# Patient Record
Sex: Male | Born: 1965 | Race: White | Hispanic: No | Smoking: Current every day smoker
Health system: Southern US, Community
[De-identification: ages and names within clinical notes are randomized; demographics above are authoritative.]

## PROBLEM LIST (undated history)

## (undated) DIAGNOSIS — E7800 Pure hypercholesterolemia, unspecified: Secondary | ICD-10-CM

## (undated) DIAGNOSIS — D696 Thrombocytopenia, unspecified: Secondary | ICD-10-CM

## (undated) DIAGNOSIS — E119 Type 2 diabetes mellitus without complications: Secondary | ICD-10-CM

## (undated) DIAGNOSIS — D649 Anemia, unspecified: Secondary | ICD-10-CM

## (undated) DIAGNOSIS — G629 Polyneuropathy, unspecified: Secondary | ICD-10-CM

## (undated) DIAGNOSIS — E1140 Type 2 diabetes mellitus with diabetic neuropathy, unspecified: Secondary | ICD-10-CM

## (undated) DIAGNOSIS — R600 Localized edema: Secondary | ICD-10-CM

## (undated) DIAGNOSIS — T8131XA Disruption of external operation (surgical) wound, not elsewhere classified, initial encounter: Secondary | ICD-10-CM

## (undated) DIAGNOSIS — E538 Deficiency of other specified B group vitamins: Secondary | ICD-10-CM

## (undated) DIAGNOSIS — Z8719 Personal history of other diseases of the digestive system: Secondary | ICD-10-CM

## (undated) DIAGNOSIS — G250 Essential tremor: Secondary | ICD-10-CM

## (undated) DIAGNOSIS — F329 Major depressive disorder, single episode, unspecified: Secondary | ICD-10-CM

## (undated) DIAGNOSIS — J45909 Unspecified asthma, uncomplicated: Secondary | ICD-10-CM

## (undated) DIAGNOSIS — L03116 Cellulitis of left lower limb: Secondary | ICD-10-CM

## (undated) DIAGNOSIS — F1010 Alcohol abuse, uncomplicated: Secondary | ICD-10-CM

## (undated) DIAGNOSIS — K219 Gastro-esophageal reflux disease without esophagitis: Secondary | ICD-10-CM

## (undated) DIAGNOSIS — G621 Alcoholic polyneuropathy: Secondary | ICD-10-CM

## (undated) DIAGNOSIS — F419 Anxiety disorder, unspecified: Secondary | ICD-10-CM

## (undated) DIAGNOSIS — M869 Osteomyelitis, unspecified: Secondary | ICD-10-CM

## (undated) DIAGNOSIS — Z9889 Other specified postprocedural states: Secondary | ICD-10-CM

## (undated) DIAGNOSIS — I10 Essential (primary) hypertension: Secondary | ICD-10-CM

## (undated) DIAGNOSIS — F32A Depression, unspecified: Secondary | ICD-10-CM

## (undated) DIAGNOSIS — I739 Peripheral vascular disease, unspecified: Secondary | ICD-10-CM

## (undated) DIAGNOSIS — M1990 Unspecified osteoarthritis, unspecified site: Secondary | ICD-10-CM

## (undated) HISTORY — PX: OTHER SURGICAL HISTORY: SHX169

---
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
## 2001-05-03 ENCOUNTER — Emergency Department (HOSPITAL_COMMUNITY): Admission: EM | Admit: 2001-05-03 | Discharge: 2001-05-03 | Payer: Self-pay | Admitting: *Deleted

## 2004-04-19 ENCOUNTER — Emergency Department (HOSPITAL_COMMUNITY): Admission: EM | Admit: 2004-04-19 | Discharge: 2004-04-19 | Payer: Self-pay | Admitting: Emergency Medicine

## 2008-10-09 ENCOUNTER — Emergency Department (HOSPITAL_COMMUNITY): Admission: EM | Admit: 2008-10-09 | Discharge: 2008-10-09 | Payer: Self-pay | Admitting: Emergency Medicine

## 2011-08-05 LAB — CSF CULTURE

## 2011-08-05 LAB — URINALYSIS, ROUTINE W REFLEX MICROSCOPIC
Bilirubin Urine: NEGATIVE
Glucose, UA: NEGATIVE mg/dL
Hgb urine dipstick: NEGATIVE
Nitrite: NEGATIVE
Protein, ur: NEGATIVE mg/dL
Specific Gravity, Urine: 1.015 (ref 1.005–1.030)
Urobilinogen, UA: 4 mg/dL — ABNORMAL HIGH (ref 0.0–1.0)
pH: 6 (ref 5.0–8.0)

## 2011-08-05 LAB — CSF CELL COUNT WITH DIFFERENTIAL
Eosinophils, CSF: 6 % — ABNORMAL HIGH (ref 0–1)
Lymphs, CSF: 30 % — ABNORMAL LOW (ref 40–80)
Monocyte-Macrophage-Spinal Fluid: 4 % — ABNORMAL LOW (ref 15–45)
RBC Count, CSF: 2640 /mm3 — ABNORMAL HIGH
RBC Count, CSF: 44000 /mm3 — ABNORMAL HIGH
Segmented Neutrophils-CSF: 60 % — ABNORMAL HIGH (ref 0–6)
Tube #: 1
Tube #: 4
WBC, CSF: 2 /mm3 (ref 0–5)
WBC, CSF: 215 /mm3 — ABNORMAL HIGH (ref 0–5)

## 2011-08-05 LAB — BASIC METABOLIC PANEL
BUN: 9 mg/dL (ref 6–23)
CO2: 26 mEq/L (ref 19–32)
Calcium: 9.2 mg/dL (ref 8.4–10.5)
Chloride: 101 mEq/L (ref 96–112)
Creatinine, Ser: 0.97 mg/dL (ref 0.4–1.5)
GFR calc Af Amer: >60 mL/min (ref >60)
GFR calc non Af Amer: >60 mL/min (ref >60)
Glucose, Bld: 130 mg/dL — ABNORMAL HIGH (ref 70–99)
Potassium: 3.7 mEq/L (ref 3.5–5.1)
Sodium: 135 mEq/L (ref 135–145)

## 2011-08-05 LAB — PROTEIN AND GLUCOSE, CSF
Glucose, CSF: 69 mg/dL (ref 43–76)
Total  Protein, CSF: 55 mg/dL — ABNORMAL HIGH (ref 15–45)

## 2011-08-05 LAB — CSF CULTURE W GRAM STAIN: Culture: NO GROWTH

## 2014-03-10 ENCOUNTER — Emergency Department (HOSPITAL_COMMUNITY): Payer: BC Managed Care – PPO

## 2014-03-10 ENCOUNTER — Encounter (HOSPITAL_COMMUNITY): Payer: Self-pay | Admitting: Emergency Medicine

## 2014-03-10 ENCOUNTER — Observation Stay (HOSPITAL_COMMUNITY)
Admission: EM | Admit: 2014-03-10 | Discharge: 2014-03-11 | Disposition: A | Discharge status: Home / Self Care | Payer: BC Managed Care – PPO | Attending: Internal Medicine | Admitting: Internal Medicine

## 2014-03-10 DIAGNOSIS — I152 Hypertension secondary to endocrine disorders: Secondary | ICD-10-CM | POA: Diagnosis present

## 2014-03-10 DIAGNOSIS — E1142 Type 2 diabetes mellitus with diabetic polyneuropathy: Secondary | ICD-10-CM | POA: Diagnosis present

## 2014-03-10 DIAGNOSIS — E785 Hyperlipidemia, unspecified: Secondary | ICD-10-CM | POA: Diagnosis present

## 2014-03-10 DIAGNOSIS — E1169 Type 2 diabetes mellitus with other specified complication: Secondary | ICD-10-CM | POA: Diagnosis present

## 2014-03-10 DIAGNOSIS — E669 Obesity, unspecified: Secondary | ICD-10-CM | POA: Insufficient documentation

## 2014-03-10 DIAGNOSIS — R42 Dizziness and giddiness: Secondary | ICD-10-CM | POA: Insufficient documentation

## 2014-03-10 DIAGNOSIS — I10 Essential (primary) hypertension: Secondary | ICD-10-CM

## 2014-03-10 DIAGNOSIS — E1159 Type 2 diabetes mellitus with other circulatory complications: Secondary | ICD-10-CM | POA: Diagnosis present

## 2014-03-10 DIAGNOSIS — R079 Chest pain, unspecified: Principal | ICD-10-CM | POA: Diagnosis present

## 2014-03-10 DIAGNOSIS — R0609 Other forms of dyspnea: Secondary | ICD-10-CM | POA: Insufficient documentation

## 2014-03-10 DIAGNOSIS — R0600 Dyspnea, unspecified: Secondary | ICD-10-CM

## 2014-03-10 DIAGNOSIS — M6281 Muscle weakness (generalized): Secondary | ICD-10-CM | POA: Insufficient documentation

## 2014-03-10 DIAGNOSIS — M549 Dorsalgia, unspecified: Secondary | ICD-10-CM | POA: Insufficient documentation

## 2014-03-10 DIAGNOSIS — F1010 Alcohol abuse, uncomplicated: Secondary | ICD-10-CM

## 2014-03-10 DIAGNOSIS — R0602 Shortness of breath: Secondary | ICD-10-CM | POA: Insufficient documentation

## 2014-03-10 DIAGNOSIS — E7800 Pure hypercholesterolemia, unspecified: Secondary | ICD-10-CM

## 2014-03-10 DIAGNOSIS — D696 Thrombocytopenia, unspecified: Secondary | ICD-10-CM

## 2014-03-10 DIAGNOSIS — F1210 Cannabis abuse, uncomplicated: Secondary | ICD-10-CM | POA: Insufficient documentation

## 2014-03-10 DIAGNOSIS — R0989 Other specified symptoms and signs involving the circulatory and respiratory systems: Secondary | ICD-10-CM

## 2014-03-10 DIAGNOSIS — R209 Unspecified disturbances of skin sensation: Secondary | ICD-10-CM | POA: Insufficient documentation

## 2014-03-10 DIAGNOSIS — E119 Type 2 diabetes mellitus without complications: Secondary | ICD-10-CM

## 2014-03-10 DIAGNOSIS — R61 Generalized hyperhidrosis: Secondary | ICD-10-CM | POA: Insufficient documentation

## 2014-03-10 DIAGNOSIS — Z6835 Body mass index (BMI) 35.0-35.9, adult: Secondary | ICD-10-CM | POA: Insufficient documentation

## 2014-03-10 HISTORY — DX: Essential (primary) hypertension: I10

## 2014-03-10 HISTORY — DX: Pure hypercholesterolemia, unspecified: E78.00

## 2014-03-10 HISTORY — DX: Type 2 diabetes mellitus without complications: E11.9

## 2014-03-10 LAB — CBC WITH DIFFERENTIAL/PLATELET
Basophils Absolute: 0 10*3/uL (ref 0.0–0.1)
Basophils Relative: 1 % (ref 0–1)
Eosinophils Absolute: 0.1 10*3/uL (ref 0.0–0.7)
Eosinophils Relative: 3 % (ref 0–5)
HCT: 42.7 % (ref 39.0–52.0)
Hemoglobin: 15.1 g/dL (ref 13.0–17.0)
Lymphocytes Relative: 38 % (ref 12–46)
Lymphs Abs: 1.9 10*3/uL (ref 0.7–4.0)
MCH: 33.4 pg (ref 26.0–34.0)
MCHC: 35.4 g/dL (ref 30.0–36.0)
MCV: 94.5 fL (ref 78.0–100.0)
Monocytes Absolute: 0.3 10*3/uL (ref 0.1–1.0)
Monocytes Relative: 6 % (ref 3–12)
Neutro Abs: 2.6 10*3/uL (ref 1.7–7.7)
Neutrophils Relative %: 52 % (ref 43–77)
Platelets: 129 10*3/uL — ABNORMAL LOW (ref 150–400)
RBC: 4.52 MIL/uL (ref 4.22–5.81)
RDW: 13.9 % (ref 11.5–15.5)
WBC: 4.9 10*3/uL (ref 4.0–10.5)

## 2014-03-10 LAB — GLUCOSE, CAPILLARY: Glucose-Capillary: 148 mg/dL — ABNORMAL HIGH (ref 70–99)

## 2014-03-10 LAB — BASIC METABOLIC PANEL
BUN: 14 mg/dL (ref 6–23)
CO2: 24 mEq/L (ref 19–32)
Calcium: 9.5 mg/dL (ref 8.4–10.5)
Chloride: 100 mEq/L (ref 96–112)
Creatinine, Ser: 1.02 mg/dL (ref 0.50–1.35)
GFR calc Af Amer: >90 mL/min (ref >90)
GFR calc non Af Amer: 85 mL/min — ABNORMAL LOW (ref >90)
Glucose, Bld: 116 mg/dL — ABNORMAL HIGH (ref 70–99)
Potassium: 3.7 mEq/L (ref 3.7–5.3)
Sodium: 137 mEq/L (ref 137–147)

## 2014-03-10 LAB — TROPONIN I
Troponin I: <0.3 ng/mL (ref <0.30)
Troponin I: <0.3 ng/mL (ref <0.30)
Troponin I: <0.3 ng/mL (ref <0.30)

## 2014-03-10 LAB — LIPID PANEL
Cholesterol: 150 mg/dL (ref 0–200)
HDL: 39 mg/dL — ABNORMAL LOW (ref >39)
LDL Cholesterol: 78 mg/dL (ref 0–99)
Total CHOL/HDL Ratio: 3.8 RATIO
Triglycerides: 164 mg/dL — ABNORMAL HIGH (ref <150)
VLDL: 33 mg/dL (ref 0–40)

## 2014-03-10 LAB — CREATININE, SERUM
Creatinine, Ser: 0.9 mg/dL (ref 0.50–1.35)
GFR calc Af Amer: >90 mL/min (ref >90)
GFR calc non Af Amer: >90 mL/min (ref >90)

## 2014-03-10 LAB — D-DIMER, QUANTITATIVE: D-Dimer, Quant: <0.27 ug/mL-FEU (ref 0.00–0.48)

## 2014-03-10 MED ORDER — SODIUM CHLORIDE 0.9 % IJ SOLN: 3.0000 mL | Freq: Two times a day (BID) | INTRAMUSCULAR | Status: DC

## 2014-03-10 MED ORDER — CYCLOBENZAPRINE HCL 10 MG PO TABS
5.0000 mg | ORAL_TABLET | Freq: Three times a day (TID) | ORAL | Status: DC | PRN
  Administered 2014-03-10: 5 mg via ORAL
  Filled 2014-03-10: qty 1

## 2014-03-10 MED ORDER — LORAZEPAM 2 MG/ML IJ SOLN: 1.0000 mg | Freq: Four times a day (QID) | INTRAMUSCULAR | Status: DC | PRN

## 2014-03-10 MED ORDER — LISINOPRIL 10 MG PO TABS
20.0000 mg | ORAL_TABLET | Freq: Every day | ORAL | Status: DC
  Administered 2014-03-11: 20 mg via ORAL
  Filled 2014-03-10: qty 2

## 2014-03-10 MED ORDER — THIAMINE HCL 100 MG/ML IJ SOLN: 100.0000 mg | Freq: Every day | INTRAMUSCULAR | Status: DC

## 2014-03-10 MED ORDER — ASPIRIN EC 325 MG PO TBEC
325.0000 mg | DELAYED_RELEASE_TABLET | Freq: Every day | ORAL | Status: DC
  Administered 2014-03-11: 325 mg via ORAL
  Filled 2014-03-10: qty 1

## 2014-03-10 MED ORDER — SODIUM CHLORIDE 0.9 % IV SOLN
INTRAVENOUS | Status: AC
  Administered 2014-03-10: 20:00:00 via INTRAVENOUS

## 2014-03-10 MED ORDER — INSULIN ASPART 100 UNIT/ML ~~LOC~~ SOLN: 0.0000 [IU] | Freq: Every day | SUBCUTANEOUS | Status: DC

## 2014-03-10 MED ORDER — INSULIN ASPART 100 UNIT/ML ~~LOC~~ SOLN: 0.0000 [IU] | Freq: Three times a day (TID) | SUBCUTANEOUS | Status: DC

## 2014-03-10 MED ORDER — ASPIRIN 81 MG PO CHEW
324.0000 mg | CHEWABLE_TABLET | Freq: Once | ORAL | Status: AC
  Administered 2014-03-10: 324 mg via ORAL
  Filled 2014-03-10: qty 4

## 2014-03-10 MED ORDER — LORAZEPAM 1 MG PO TABS
1.0000 mg | ORAL_TABLET | Freq: Four times a day (QID) | ORAL | Status: DC | PRN
Start: 2014-03-10 — End: 2014-03-11

## 2014-03-10 MED ORDER — FOLIC ACID 1 MG PO TABS
1.0000 mg | ORAL_TABLET | Freq: Every day | ORAL | Status: DC
  Administered 2014-03-10 – 2014-03-11 (×2): 1 mg via ORAL
  Filled 2014-03-10 (×2): qty 1

## 2014-03-10 MED ORDER — VITAMIN B-1 100 MG PO TABS
100.0000 mg | ORAL_TABLET | Freq: Every day | ORAL | Status: DC
  Administered 2014-03-10 – 2014-03-11 (×2): 100 mg via ORAL
  Filled 2014-03-10 (×2): qty 1

## 2014-03-10 MED ORDER — MORPHINE SULFATE 2 MG/ML IJ SOLN
2.0000 mg | INTRAMUSCULAR | Status: DC | PRN
Start: 2014-03-10 — End: 2014-03-11

## 2014-03-10 MED ORDER — ENOXAPARIN SODIUM 60 MG/0.6ML ~~LOC~~ SOLN
60.0000 mg | SUBCUTANEOUS | Status: DC
  Administered 2014-03-10: 60 mg via SUBCUTANEOUS
  Filled 2014-03-10: qty 0.6

## 2014-03-10 MED ORDER — ADULT MULTIVITAMIN W/MINERALS CH
1.0000 | ORAL_TABLET | Freq: Every day | ORAL | Status: DC
  Administered 2014-03-10 – 2014-03-11 (×2): 1 via ORAL
  Filled 2014-03-10 (×2): qty 1

## 2014-03-10 NOTE — H&P (Signed)
[facility] History and Physical  Eugene Diaz AOZ:[id number] DOB: 05/31/1966 DOA: 03/10/2014  Referring physician:  PCP: Collene Mares, PA-C   Chief Complaint: Shortness of breath, chest pain, generalized weakness  HPI: Eugene Diaz is a 48 y.o. male with past medical history of newly diagnosed diabetes, hypertension and hypercholesterolemia presents to the emergency department with the chief complaint of shortness of breath/chest pain generalized weakness that has been gradually worsening over the last 1-2 weeks. He states he was recently diagnosed with diabetes in about 3 weeks ago was started on oral medications. He feels like since then he has felt "funny". Over the last week to 10 days he has developed shortness of breath and chest pain. Chest pain is located left anterior radiates to his back is worse with exertion and described as a tightness. with exertion. Associated symptoms include back pain located in mid thoracic area worsening with deep inspiration and movement and lower extremity tingling. He states the symptom come and go he was out of town and he did not call his doctor until today. His PCP recommended he come to the emergency department. He did travel to [facility] last week and drove home Friday. He indicates he had these symptoms prior to the trip. He denies abdominal pain nausea vomiting diarrhea constipation melena hematochezia. He denies fever chills cough headache syncope near-syncope. His workup in the emergency room includes a basic metabolic panel that is unremarkable and shows a serum glucose of 116 and a complete blood count is unremarkable with the exception of platelets 129. Chest x-ray is without acute cardiopulmonary process, EKG with sinus rhythm and abnormal R wave progression. In the emergency room he was given 324 mg of aspirin. He is hemodynamically stable afebrile and not hypoxic  Review of Systems:  10 point review of systems completed and all  systems are negative except as indicated in the history of present illness   Past Medical History  Diagnosis Date  . Diabetes mellitus without complication   . Hypertension   . Hypercholesterolemia    History reviewed. No pertinent past surgical history. Social History:  reports that he has never smoked. He does not have any smokeless tobacco history on file. He reports that he drinks alcohol. He reports that he uses illicit drugs (Marijuana). 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 He is married lives with his wife is independent with ADLs No Known Allergies  History reviewed. No pertinent family history. family medical history is positive for father has been dead for 30 years patient is unsure of his medical history. Mother is alive her medical history is negative for diabetes heart failure she did have "some sort cancer". Patient has a sister he is unsure of her medical health but he feels like she does not have diabetes or heart disease.  Prior to Admission medications   Medication Sig Start Date End Date Taking? Authorizing Provider  Empagliflozin (JARDIANCE) 25 MG TABS Take 1 tablet by mouth daily.   Yes Historical Provider, MD  lisinopril (PRINIVIL,ZESTRIL) 20 MG tablet Take 20 mg by mouth daily.   Yes Historical Provider, MD  sitaGLIPtin-metformin (JANUMET) 50-1000 MG per tablet Take 1 tablet by mouth daily.   Yes Historical Provider, MD   Physical Exam: Filed Vitals:   03/10/14 1400  BP: 124/84  Pulse: 71  Temp:   Resp:     BP 124/84  Pulse 71  Temp(Src) 98.3 F (36.8 C) (Oral)  Resp 20  SpO2 99%  General:  Appears calm and comfortable, looks older than his stated age of 59 Eyes: PERRL, normal lids, irises & conjunctiva ENT: grossly normal hearing, lips & tongue Neck: no LAD, masses or thyromegaly Cardiovascular: RRR, no m/r/g. No LE edema. Some tenderness to palpation left anterior chest as well as mid thoracic back. Telemetry: SR, no arrhythmias   Respiratory: CTA bilaterally, no w/r/r. Normal respiratory effort. Abdomen: soft, ntnd positive bowel sounds throughout Skin: no rash or induration seen on limited exam Musculoskeletal: grossly normal tone BUE/BLE good muscle tone Psychiatric: grossly normal mood and affect, speech fluent and appropriate Neurologic: grossly non-focal. Speech clear facial some           Labs on Admission:  Basic Metabolic Panel:  Recent Labs Lab 03/10/14 1240  NA 137  K 3.7  CL 100  CO2 24  GLUCOSE 116*  BUN 14  CREATININE 1.02  CALCIUM 9.5   Liver Function Tests: No results found for this basename: AST, ALT, ALKPHOS, BILITOT, PROT, ALBUMIN,  in the last 168 hours No results found for this basename: LIPASE, AMYLASE,  in the last 168 hours No results found for this basename: AMMONIA,  in the last 168 hours CBC:  Recent Labs Lab 03/10/14 1240  WBC 4.9  NEUTROABS 2.6  HGB 15.1  HCT 42.7  MCV 94.5  PLT 129*   Cardiac Enzymes:  Recent Labs Lab 03/10/14 1240  TROPONINI <0.30    BNP (last 3 results) No results found for this basename: PROBNP,  in the last 8760 hours CBG: No results found for this basename: GLUCAP,  in the last 168 hours  Radiological Exams on Admission: Dg Chest Portable 1 View  03/10/2014   CLINICAL DATA:  Shortness of breath.  EXAM: PORTABLE CHEST - 1 VIEW  COMPARISON:  None.  FINDINGS: 1259 hrs. No focal airspace consolidation. No airspace pulmonary edema. No pleural effusion or pneumothorax. Cardiopericardial silhouette is at upper limits of normal for size. Telemetry leads overlie the chest.  IMPRESSION: No acute cardiopulmonary findings.   Electronically Signed   By: Misty Stanley M.D.   On: 03/10/2014 13:18    EKG: Independently reviewed normal sinus rhythm Assessment/Plan Principal Problem:   Chest pain, rule out acute myocardial infarction: Risk factors include obesity, diabetes, hypertension. Will admit to telemetry. I doubt this is cardiac related as  patient's pain is reproducible on palpation. We'll cycle cardiac enzymes get serial EKGs request 2-D echo. Will continue aspirin obtain a lipid panel. Provide nitroglycerin and morphine as needed for any chest pain. Offer oxygen supplementation as indicated.  Active Problems:   Diabetes mellitus without complication: Patient recently diagnosed and started on Jardiance and janumet. He reports his blood sugars have been running between 180 and 225. He checks them twice a day. I will hold these medications for now. Obtain hemoglobin A1c and use sliding scale insulin for optimal control. Patient feels like onset of symptoms coincide with initiation of medication. He does report that he complained of side effects to his PCP and dosage of medications was reduced.    Hypertension: Controlled. Continued lisinopril    Hypercholesterolemia: Will obtain a lipid panel.    Thrombocytopenia: likely related to ETOH use. No signs symptoms of bleeding. Will monitor.  ETOH abuse: reports drinking average of 6 pack daily. Will use CIWa. Request SW consult.      Code Status: full Family Communication: wife at bedside Disposition Plan: home hopefully in am.   Time spent: 69 minutes  [facility]  Pager [phone]

## 2014-03-10 NOTE — H&P (Signed)
Patient seen and examined.  Note per Dyanne Carrel, NP reviewed.  Please refer for further details.  Patient presents to the emergency room today with complaints of progressive shortness of breath on exertion. He has associated heaviness in his chest. He describes pain between his shoulder blades which is worse on deep inspiration. Symptoms are exacerbated by exertion, relieved with rest. He has associated diaphoresis and lightheadedness. Symptoms have been progressive. He has risk factors for coronary disease including hypertension, obesity, diabetes. He recently went on a trip in [place]. Patient will be admitted for overnight observation. Cardiac enzymes will be checked. EKG is nonacute. Echocardiogram will be checked. We'll also check d-dimer, and if positive will consider CT angiography chest. Due to his multiple risk factors and concerning history, who requested cardiology consultation for possible stress test in the morning. We'll keep the patient n.p.o. after midnight in case stress test is pursued tomorrow. Continue on aspirin.  [employer]

## 2014-03-10 NOTE — ED Notes (Signed)
Floor unable to take report at this time.

## 2014-03-10 NOTE — ED Notes (Signed)
Santiago Glad PA paged for pain med for pt c/o pain to back

## 2014-03-10 NOTE — ED Provider Notes (Signed)
CSN: [id number]     Arrival date & time 03/10/14  1146 History  This chart was scribed for Eugene Cable, MD by Jenne Campus, ED Scribe. This patient was seen in room APA17/APA17 and the patient's care was started at 12:37 PM.    Chief Complaint  Patient presents with  . Shortness of Breath     Patient is a 48 y.o. male presenting with shortness of breath. The history is provided by the patient. No language interpreter was used.  Shortness of Breath Severity:  Severe Onset quality:  Gradual Duration:  1 week Timing:  Constant Progression:  Worsening Chronicity:  New Relieved by:  Rest Worsened by:  Exertion and activity Associated symptoms: chest pain   Associated symptoms: no fever and no vomiting   Risk factors: no hx of PE/DVT     HPI Comments: Eugene Diaz is a 48 y.o. male who presents to the Emergency Department complaining of a new onset of SOB with associated back pain and bilateral lower extremity pain that has been present for the past week. Exertion aggravates the symptoms and he reports decreased ambulation due to the symptoms. He denies any MI, DVT/PE or other pulmonary conditions. He traveled to [place] last week but has symptoms prior to travel He also reports episodes of chest tightness but none currently He also reports a secondary complaint of dizziness that is noted with sudden movements, but he denies any abdominal pain, emesis, diarrhea, fevers or cough as associated symptoms. Wife states that the pt was recently diagnosed with DM one month ago. The pt denies smoking.    Past Medical History  Diagnosis Date  . Diabetes mellitus without complication   . Hypertension   . Hypercholesterolemia    History reviewed. No pertinent past surgical history. History reviewed. No pertinent family history. History  Substance Use Topics  . Smoking status: Never Smoker   . Smokeless tobacco: Not on file  . Alcohol Use: Yes    Review of Systems   Constitutional: Negative for fever.  Respiratory: Positive for shortness of breath.   Cardiovascular: Positive for chest pain.  Gastrointestinal: Negative for vomiting and diarrhea.  Neurological: Positive for dizziness.  All other systems reviewed and are negative.   Allergies  Review of patient's allergies indicates no known allergies.  Home Medications   Prior to Admission medications   Medication Sig Start Date End Date Taking? Authorizing Provider  Empagliflozin (JARDIANCE) 25 MG TABS Take 1 tablet by mouth daily.   Yes Historical Provider, MD  lisinopril (PRINIVIL,ZESTRIL) 20 MG tablet Take 20 mg by mouth daily.   Yes Historical Provider, MD  sitaGLIPtin-metformin (JANUMET) 50-1000 MG per tablet Take 1 tablet by mouth daily.   Yes Historical Provider, MD   Triage Vitals: P 123/79  Pulse 75  Temp(Src) 98.3 F (36.8 C) (Oral)  Resp 20  SpO2 100%  Physical Exam  Nursing note and vitals reviewed.  CONSTITUTIONAL: Well developed/well nourished HEAD: Normocephalic/atraumatic EYES: EOMI/PERRL ENMT: Mucous membranes moist NECK: supple no meningeal signs SPINE:entire spine nontender CV: S1/S2 noted, no murmurs/rubs/gallops noted LUNGS: Lungs are clear to auscultation bilaterally, no apparent distress ABDOMEN: soft, nontender, no rebound or guarding GU:no cva tenderness NEURO: Pt is awake/alert, moves all extremitiesx4 EXTREMITIES: pulses normal, full ROM, no calf tenderness noted SKIN: warm, color normal PSYCH: no abnormalities of mood noted  ED Course  Procedures   Medications  aspirin chewable tablet 324 mg (324 mg Oral Given 03/10/14 1254)    DIAGNOSTIC STUDIES:  Oxygen Saturation is 100% on RA, normal by my interpretation.    COORDINATION OF CARE: 12:42 PM-Discussed treatment plan which includes CXR, CBC panel and BMP  with pt at bedside and pt agreed to plan.  2:48 PM Pt with concerning h/o worsening exertional SOB and also episodes of CP He does have risk  factors for CAD.   Will admit for further monitoring and ACS rule out Currently PERC negative, doubt PE/Dissection at this time D/w dr Roderic Palau, will admit Labs Review Labs Reviewed  BASIC METABOLIC PANEL - Abnormal; Notable for the following:    Glucose, Bld 116 (*)    GFR calc non Af Amer 85 (*)    All other components within normal limits  CBC WITH DIFFERENTIAL - Abnormal; Notable for the following:    Platelets 129 (*)    All other components within normal limits  TROPONIN I    Imaging Review Dg Chest Portable 1 View  03/10/2014   CLINICAL DATA:  Shortness of breath.  EXAM: PORTABLE CHEST - 1 VIEW  COMPARISON:  None.  FINDINGS: 1259 hrs. No focal airspace consolidation. No airspace pulmonary edema. No pleural effusion or pneumothorax. Cardiopericardial silhouette is at upper limits of normal for size. Telemetry leads overlie the chest.  IMPRESSION: No acute cardiopulmonary findings.   Electronically Signed   By: Misty Stanley M.D.   On: 03/10/2014 13:18     EKG Interpretation   Date/Time:  Monday Mar 10 2014 12:21:35 EDT Ventricular Rate:  70 PR Interval:  172 QRS Duration: 108 QT Interval:  398 QTC Calculation: 429 R Axis:   -61 Text Interpretation:  Sinus rhythm Left anterior fascicular block  Borderline low voltage, extremity leads Abnormal R-wave progression, late  transition No previous ECGs available Confirmed by Christy Gentles  MD, Elenore Rota  [id number]) on 03/10/2014 12:36:07 PM      MDM   Final diagnoses:  Dyspnea  Chest pain, rule out acute myocardial infarction    I personally performed the services described in this documentation, which was scribed in my presence. The recorded information has been reviewed and is accurate.        Eugene Cable, MD 03/10/14 [phone]

## 2014-03-10 NOTE — ED Notes (Signed)
Santiago Glad PA paged again

## 2014-03-10 NOTE — ED Notes (Signed)
Sob, back pain, legs and feet hurt.  Hands hurt, dizzy

## 2014-03-11 ENCOUNTER — Observation Stay (HOSPITAL_COMMUNITY): Payer: BC Managed Care – PPO

## 2014-03-11 ENCOUNTER — Encounter (HOSPITAL_COMMUNITY): Payer: Self-pay

## 2014-03-11 ENCOUNTER — Encounter (HOSPITAL_COMMUNITY): Payer: Self-pay | Admitting: Adult Health

## 2014-03-11 DIAGNOSIS — R0989 Other specified symptoms and signs involving the circulatory and respiratory systems: Secondary | ICD-10-CM

## 2014-03-11 DIAGNOSIS — R079 Chest pain, unspecified: Secondary | ICD-10-CM

## 2014-03-11 DIAGNOSIS — I517 Cardiomegaly: Secondary | ICD-10-CM

## 2014-03-11 DIAGNOSIS — F1010 Alcohol abuse, uncomplicated: Secondary | ICD-10-CM

## 2014-03-11 DIAGNOSIS — E119 Type 2 diabetes mellitus without complications: Secondary | ICD-10-CM

## 2014-03-11 DIAGNOSIS — R0609 Other forms of dyspnea: Secondary | ICD-10-CM

## 2014-03-11 LAB — CBC
HCT: 43.1 % (ref 39.0–52.0)
Hemoglobin: 14.9 g/dL (ref 13.0–17.0)
MCH: 33.1 pg (ref 26.0–34.0)
MCHC: 34.6 g/dL (ref 30.0–36.0)
MCV: 95.8 fL (ref 78.0–100.0)
Platelets: 137 10*3/uL — ABNORMAL LOW (ref 150–400)
RBC: 4.5 MIL/uL (ref 4.22–5.81)
RDW: 14.2 % (ref 11.5–15.5)
WBC: 4.9 10*3/uL (ref 4.0–10.5)

## 2014-03-11 LAB — BASIC METABOLIC PANEL
BUN: 13 mg/dL (ref 6–23)
CO2: 27 mEq/L (ref 19–32)
Calcium: 9.4 mg/dL (ref 8.4–10.5)
Chloride: 105 mEq/L (ref 96–112)
Creatinine, Ser: 0.95 mg/dL (ref 0.50–1.35)
GFR calc Af Amer: >90 mL/min (ref >90)
GFR calc non Af Amer: >90 mL/min (ref >90)
Glucose, Bld: 120 mg/dL — ABNORMAL HIGH (ref 70–99)
Potassium: 4.2 mEq/L (ref 3.7–5.3)
Sodium: 142 mEq/L (ref 137–147)

## 2014-03-11 LAB — GLUCOSE, CAPILLARY
Glucose-Capillary: 128 mg/dL — ABNORMAL HIGH (ref 70–99)
Glucose-Capillary: 111 mg/dL — ABNORMAL HIGH (ref 70–99)

## 2014-03-11 LAB — D-DIMER, QUANTITATIVE: D-Dimer, Quant: 0.29 ug/mL-FEU (ref 0.00–0.48)

## 2014-03-11 LAB — HEMOGLOBIN A1C
Hgb A1c MFr Bld: 6.5 % — ABNORMAL HIGH (ref <5.7)
Mean Plasma Glucose: 140 mg/dL — ABNORMAL HIGH (ref <117)

## 2014-03-11 LAB — TSH
TSH: 2.07 u[IU]/mL (ref 0.350–4.500)
TSH: 1.33 u[IU]/mL (ref 0.350–4.500)

## 2014-03-11 MED ORDER — LIVING WELL WITH DIABETES BOOK
Freq: Once | Status: DC
  Filled 2014-03-11 (×2): qty 1

## 2014-03-11 MED ORDER — ASPIRIN EC 81 MG PO TBEC: 81.0000 mg | DELAYED_RELEASE_TABLET | Freq: Every day | ORAL | Status: DC

## 2014-03-11 MED ORDER — REGADENOSON 0.4 MG/5ML IV SOLN
INTRAVENOUS | Status: AC
  Filled 2014-03-11: qty 5

## 2014-03-11 MED ORDER — ALBUTEROL SULFATE HFA 108 (90 BASE) MCG/ACT IN AERS: 2.0000 | INHALATION_SPRAY | Freq: Four times a day (QID) | RESPIRATORY_TRACT | Status: DC | PRN

## 2014-03-11 MED ORDER — CYCLOBENZAPRINE HCL 5 MG PO TABS: 5.0000 mg | ORAL_TABLET | Freq: Three times a day (TID) | ORAL | Status: DC | PRN

## 2014-03-11 MED ORDER — ADULT MULTIVITAMIN W/MINERALS CH: 1.0000 | ORAL_TABLET | Freq: Every day | ORAL | Status: DC

## 2014-03-11 MED ORDER — ATORVASTATIN CALCIUM 40 MG PO TABS
40.0000 mg | ORAL_TABLET | Freq: Every day | ORAL | Status: DC
Start: 2014-03-11 — End: 2014-06-05

## 2014-03-11 MED ORDER — ATORVASTATIN CALCIUM 40 MG PO TABS: 40.0000 mg | ORAL_TABLET | Freq: Every day | ORAL | Status: DC

## 2014-03-11 MED ORDER — TECHNETIUM TC 99M SESTAMIBI - CARDIOLITE
10.0000 | Freq: Once | INTRAVENOUS | Status: AC | PRN
  Administered 2014-03-11: 10 via INTRAVENOUS

## 2014-03-11 MED ORDER — TECHNETIUM TC 99M SESTAMIBI GENERIC - CARDIOLITE
30.0000 | Freq: Once | INTRAVENOUS | Status: AC | PRN
  Administered 2014-03-11: 30 via INTRAVENOUS

## 2014-03-11 MED ORDER — SODIUM CHLORIDE 0.9 % IJ SOLN
INTRAMUSCULAR | Status: AC
  Administered 2014-03-11: 10 mL via INTRAVENOUS
  Filled 2014-03-11: qty 10

## 2014-03-11 MED ORDER — FOLIC ACID 1 MG PO TABS: 1.0000 mg | ORAL_TABLET | Freq: Every day | ORAL | Status: DC

## 2014-03-11 NOTE — Discharge Summary (Signed)
Physician Discharge Summary  Eugene Diaz CBJ:[id number] DOB: Dec 16, 1965 DOA: 03/10/2014  PCP: Collene Mares, PA-C  Admit date: 03/10/2014 Discharge date: 03/11/2014  Time spent: 40 minutes  Recommendations for Outpatient Follow-up:  1. Follow up with Dr Collene Mares PCP 1-2 weeks for evaluation of symptoms. May want to consider PFT's. Recommend monitoring diabetes as mediations adjusted.   Discharge Diagnoses:  Principal Problem:   Chest pain, rule out acute myocardial infarction Active Problems:   Diabetes mellitus without complication   Hypertension   Hypercholesterolemia   Thrombocytopenia   ETOH abuse   Chest pain   Discharge Condition: stable  Diet recommendation: heart healthy carb modified  Filed Weights   03/10/14 1832 03/11/14 0500  Weight: 125.193 kg (276 lb) 117.572 kg (259 lb 3.2 oz)    History Eugene Diaz is a 48 y.o. male with past medical history of newly diagnosed diabetes, hypertension and hypercholesterolemia presented to the emergency department on 03/10/14 with the chief complaint of shortness of breath/chest pain generalized weakness that had been gradually worsening over the last 1-2 weeks. He stated he was recently diagnosed with diabetes about 3 weeks ago was started on oral medications. He felt like since then he has felt "funny". Over the last week to 10 days he had developed shortness of breath and chest pain. Chest pain  located left anterior radiated to his back was worse with exertion and described as a tightness. with exertion. Associated symptoms included back pain located in mid thoracic area worsening with deep inspiration and movement and lower extremity tingling. He stated the symptom come and go he was out of town and he did not call his doctor until today. His PCP recommended he come to the emergency department. He did travel to [facility] last week and drove home Friday. He indicates he had these symptoms prior to the trip. He denies abdominal  pain nausea vomiting diarrhea constipation melena hematochezia. He denied fever chills cough headache syncope near-syncope. His workup in the emergency room included a basic metabolic panel that was unremarkable and showed a serum glucose of 116 and a complete blood count is unremarkable with the exception of platelets 129. Chest x-ray without acute cardiopulmonary process, EKG with sinus rhythm and abnormal R wave progression. In the emergency room he was given 324 mg of aspirin. He was hemodynamically stable afebrile and not hypoxic   Hospital Course:  Principal Problem:  Chest pain, rule out acute myocardial infarction: Risk factors include obesity, diabetes, hypertension. Troponin negative x3, no events on tele. Lipid panel triglycerides 164, HDL 39, chest xray unremarkable, EKG without evidence of ACS echo with normal LV function and no wall motion abnormalities grade 2 diastolic dysfunction. Stress test negative. Evaluated by cardiology who recommendl continue aspirin and statin.  Active Problems:  Diabetes mellitus without complication: Patient recently diagnosed and started on Jardiance and janumet. He reports his blood sugars have been running between 180 and 225. A1c 6.3. Will discontinue  jardiance due to side effects. Continue Janumet. Recommend close follow up with PCP for optimal control  Hypertension: Controlled. Continued lisinopril   Hypercholesterolemia: see above  Thrombocytopenia: likely related to ETOH use. No signs symptoms of bleeding.   ETOH abuse: reports drinking average of 6 pack daily. No s.sx withdrawal Procedures:  NM stress test 03/11/14  Consultations:  cardiology  Discharge Exam: Filed Vitals:   03/11/14 1438  BP: 122/76  Pulse: 69  Temp: 97.8 F (36.6 C)  Resp: 18    General: well nourished  NAD Cardiovascular: RRR No MGR No LE edema Respiratory: normal effort BS coarse with good air flow.   Discharge Instructions You were cared for by a  hospitalist during your hospital stay. If you have any questions about your discharge medications or the care you received while you were in the hospital after you are discharged, you can call the unit and asked to speak with the hospitalist on call if the hospitalist that took care of you is not available. Once you are discharged, your primary care physician will handle any further medical issues. Please note that NO REFILLS for any discharge medications will be authorized once you are discharged, as it is imperative that you return to your primary care physician (or establish a relationship with a primary care physician if you do not have one) for your aftercare needs so that they can reassess your need for medications and monitor your lab values.      Discharge Orders   Future Orders Complete By Expires   Call MD for:  persistant nausea and vomiting  As directed    Call MD for:  temperature >100.4  As directed    Diet - low sodium heart healthy  As directed    Discharge instructions  As directed    Increase activity slowly  As directed        Medication List    STOP taking these medications       JARDIANCE 25 MG Tabs  Generic drug:  Empagliflozin      TAKE these medications       albuterol 108 (90 BASE) MCG/ACT inhaler  Commonly known as:  PROVENTIL HFA;VENTOLIN HFA  Inhale 2 puffs into the lungs every 6 (six) hours as needed for wheezing or shortness of breath.     aspirin EC 81 MG tablet  Take 1 tablet (81 mg total) by mouth daily.     atorvastatin 40 MG tablet  Commonly known as:  LIPITOR  Take 1 tablet (40 mg total) by mouth daily at 6 PM.     cyclobenzaprine 5 MG tablet  Commonly known as:  FLEXERIL  Take 1 tablet (5 mg total) by mouth 3 (three) times daily as needed for muscle spasms.     folic acid 1 MG tablet  Commonly known as:  FOLVITE  Take 1 tablet (1 mg total) by mouth daily.     lisinopril 20 MG tablet  Commonly known as:  PRINIVIL,ZESTRIL  Take 20 mg by  mouth daily.     multivitamin with minerals Tabs tablet  Take 1 tablet by mouth daily.     sitaGLIPtin-metformin 50-1000 MG per tablet  Commonly known as:  JANUMET  Take 1 tablet by mouth daily.       No Known Allergies Follow-up Information   Follow up with MANN, BENJAMIN, PA-C. Schedule an appointment as soon as possible for a visit in 2 weeks. (evaluate symptoms)    Specialty:  Physician Assistant   Contact information:   [address] [place] [address]        The results of significant diagnostics from this hospitalization (including imaging, microbiology, ancillary and laboratory) are listed below for reference.    Significant Diagnostic Studies: Nm Myocar Single W/spect W/wall Motion And Ef  03/11/2014   CLINICAL DATA:  48 year old male referred for chest pain.  EXAM: MYOCARDIAL IMAGING WITH SPECT (REST AND EXERCISE)  GATED LEFT VENTRICULAR WALL MOTION STUDY  LEFT VENTRICULAR EJECTION FRACTION  TECHNIQUE: Standard myocardial SPECT imaging  was performed after resting intravenous injection of 10 mCi Tc-15m sestamibi. Subsequently, exercise tolerance test was performed by the patient under the supervision of the Cardiology staff. At peak-stress, 30 mCi Tc-45m sestamibi was injected intravenously and standard myocardial SPECT imaging was performed. Quantitative gated imaging was also performed to evaluate left ventricular wall motion, and estimate left ventricular ejection fraction.  COMPARISON:  None.  FINDINGS: Exercise stress  Baseline EKG showed normal sinus rhythm with left axis deviation and probable left anterior fascicular block. The patient was stressed according to the Bruce protocol for 9 min achieving a work level of 10.4 Mets. The resting heart rate is 76 beats per min rose to a maximal rate of 160 beats per min representing 93% of the age predicted target heart rate. The resting blood pressure of 117/76 increased up to 159/89. The test was stopped due to fatigue,  the patient did not experience any chest pain.  Stress EKG showed no specific ischemic changes and no significant arrhythmias. Interpretation was somewhat limited by heavy artifact.  Myocardial perfusion imaging  Raw images showed appropriate radiotracer uptake. There was a moderate-sized inferior wall defect seen in the resting images, this defect was less intense in the post-injection images. This area had normal wall motion, consistent with sub- diaphragmatic attenuation. There was a small apical defect seen in both the resting and post-injection images, this area had normal wall motion, consistent with apical thinning.  Gated imaging showed end-diastolic volume 109 mL, and systolic volume 46 mL, left ventricular ejection fraction 63%, t.i.d. 0.98. Wall motion was normal.  IMPRESSION: 1.  Negative Lexiscan MPI for ischemia  2. Normal left ventricular systolic function with normal wall motion  3.  Low risk study for major cardiac events.   Electronically Signed   By: Carlyle Dolly   On: 03/11/2014 14:45   Dg Chest Portable 1 View  03/10/2014   CLINICAL DATA:  Shortness of breath.  EXAM: PORTABLE CHEST - 1 VIEW  COMPARISON:  None.  FINDINGS: 1259 hrs. No focal airspace consolidation. No airspace pulmonary edema. No pleural effusion or pneumothorax. Cardiopericardial silhouette is at upper limits of normal for size. Telemetry leads overlie the chest.  IMPRESSION: No acute cardiopulmonary findings.   Electronically Signed   By: Misty Stanley M.D.   On: 03/10/2014 13:18    Microbiology: No results found for this or any previous visit (from the past 240 hour(s)).   Labs: Basic Metabolic Panel:  Recent Labs Lab 03/10/14 1240 03/10/14 1940 03/11/14 0519  NA 137  --  142  K 3.7  --  4.2  CL 100  --  105  CO2 24  --  27  GLUCOSE 116*  --  120*  BUN 14  --  13  CREATININE 1.02 0.90 0.95  CALCIUM 9.5  --  9.4   Liver Function Tests: No results found for this basename: AST, ALT, ALKPHOS, BILITOT,  PROT, ALBUMIN,  in the last 168 hours No results found for this basename: LIPASE, AMYLASE,  in the last 168 hours No results found for this basename: AMMONIA,  in the last 168 hours CBC:  Recent Labs Lab 03/10/14 1240 03/11/14 0519  WBC 4.9 4.9  NEUTROABS 2.6  --   HGB 15.1 14.9  HCT 42.7 43.1  MCV 94.5 95.8  PLT 129* 137*   Cardiac Enzymes:  Recent Labs Lab 03/10/14 1240 03/10/14 1940 03/10/14 2316  TROPONINI <0.30 <0.30 <0.30   BNP: BNP (last 3 results) No results found for  this basename: PROBNP,  in the last 8760 hours CBG:  Recent Labs Lab 03/10/14 2157 03/11/14 0250 03/11/14 0719  GLUCAP 148* 128* 111*       Signed:  Lezlie Octave Marcayla Budge  [facility] 03/11/2014, 4:31 PM

## 2014-03-11 NOTE — Consult Note (Signed)
CARDIOLOGY CONSULT NOTE   Patient ID: JAYCEN VERCHER MRN: [id number] DOB/AGE: Mar 14, 1966 48 y.o.  Admit Date: 03/10/2014 Referring Physician: PTH Primary Physician: Collene Mares, PA-C Consulting Cardiologist: Carlyle Dolly MD Primary Cardiologist: New Reason for Consultation: Chest Pain or shortness of breath  Clinical Summary Mr. Panek is a 48 y.o.male with known history of diabetes, hypertension, hypercholesterolemia, ETOH abuse. with no prior cardiac history or cardiac work-up, presented to the ER with one week of worsening shortness of breath and diaphoresis, with pain and pressure on inspiration. 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 He has been newly diagnosed with diabetes (6 weeks ago) by the [place] clinic.    On arrival to ER, BP 123/79, HR of 75, O2 sat of 100%. He was treated with ASA. CXR was normal. Troponin negative first blood draw. No D-Dimer completed. He is on DT prophylaxis due to ETOH abuse.    He is currently pain free, but complained of significant diaphoresis overnight. Breathing status has normalized.        No Known Allergies  Medications Scheduled Medications: . aspirin EC  325 mg Oral Daily  . enoxaparin (LOVENOX) injection  60 mg Subcutaneous Q24H  . folic acid  1 mg Oral Daily  . insulin aspart  0-5 Units Subcutaneous QHS  . insulin aspart  0-9 Units Subcutaneous TID WC  . lisinopril  20 mg Oral Daily  . multivitamin with minerals  1 tablet Oral Daily  . sodium chloride  3 mL Intravenous Q12H  . thiamine  100 mg Oral Daily   Or  . thiamine  100 mg Intravenous Daily    PRN Medications: cyclobenzaprine, LORazepam, LORazepam, morphine injection   Past Medical History  Diagnosis Date  . Diabetes mellitus without complication 04/4331  . Hypertension   . Hypercholesterolemia     Past Surgical History  Procedure Laterality Date  . Left arm      Left arm repair    Family History  Problem  Relation Age of Onset  . Heart attack Mother   . Cancer Father     Social History Mr. Michalski reports that he has quit smoking. He does not have any smokeless tobacco history on file. Mr. Corpening reports that he drinks about 25 ounces of alcohol per week.  Review of Systems Otherwise reviewed and negative except as outlined.  Physical Examination Blood pressure 114/76, pulse 59, temperature 97.8 F (36.6 C), temperature source Oral, resp. rate 18, height 6\' 2"  (1.88 m), weight 259 lb 3.2 oz (117.572 kg), SpO2 98.00%.  Intake/Output Summary (Last 24 hours) at 03/11/14 0921 Last data filed at 03/11/14 0540  Gross per 24 hour  Intake    960 ml  Output   2075 ml  Net  -1115 ml    Telemetry:NSR  GEN: No acute distress, resting comfortably.Obese HEENT: Conjunctiva and lids normal, oropharynx clear with moist mucosa. Neck: Supple, no elevated JVP or carotid bruits, no thyromegaly. Lungs: Clear to auscultation, nonlabored breathing at rest. Cardiac: Regular rate and rhythm, no S3 or significant systolic murmur, no pericardial rub. Abdomen: Soft, nontender, no hepatomegaly, bowel sounds present, no guarding or rebound. Extremities: No pitting edema, distal pulses 2+. Skin: Warm and dry. Musculoskeletal: No kyphosis. Neuropsychiatric: Alert and oriented x3, affect grossly appropriate.  Prior Cardiac Testing/Procedures None Lab Results  Basic Metabolic Panel:  Recent Labs Lab 03/10/14 1240 03/10/14 1940 03/11/14 0519  NA 137  --  142  K 3.7  --  4.2  CL 100  --  105  CO2 24  --  27  GLUCOSE 116*  --  120*  BUN 14  --  13  CREATININE 1.02 0.90 0.95  CALCIUM 9.5  --  9.4    CBC:  Recent Labs Lab 03/10/14 1240 03/11/14 0519  WBC 4.9 4.9  NEUTROABS 2.6  --   HGB 15.1 14.9  HCT 42.7 43.1  MCV 94.5 95.8  PLT 129* 137*    Cardiac Enzymes:  Recent Labs Lab 03/10/14 1240 03/10/14 1940 03/10/14 2316  TROPONINI <0.30 <0.30 <0.30    Radiology: Dg Chest  Portable 1 View  03/10/2014   CLINICAL DATA:  Shortness of breath.  EXAM: PORTABLE CHEST - 1 VIEW  COMPARISON:  None.  FINDINGS: 1259 hrs. No focal airspace consolidation. No airspace pulmonary edema. No pleural effusion or pneumothorax. Cardiopericardial silhouette is at upper limits of normal for size. Telemetry leads overlie the chest.  IMPRESSION: No acute cardiopulmonary findings.   Electronically Signed   By: Misty Stanley M.D.   On: 03/10/2014 13:18     ECG:NSR with Left axis deviation.   Impression and Recommendations 1.Chest Pain: Associated DOE, diaphoresis, weakness. No prior cardiac history with multiple CVRF to include newly diagnosed diabetes, hypertension, FH, obesity, hypercholesterolemia and ETOH abuse, with former tobacco abuse. EKG without evidence of ACS, troponin is negative X1. He has been kept NPO and will have NM stress Cardiolite today. Echo is pending. Continue ASA. Check TSH.  2, Dyspnea:  Nephew recently diagnosed with multiple PE's requiring IVC filter. I will check D-Dimer for evaluation of same.   3. Hypertension: Currently well controlled. On lisinopril 20 mg daily.  Echo pending  4. Diabetes: Newly diagnosed. He is now on SS insulin coverage.  5. ETOH abuse: Admits to 6-8 beers a day, with heavy whiskey use in the past. Now on DT precautions. He also smokes marijuana.   6. Obesity: BMI 35.5. Lifestyle modifications with increased exercise and caloric limitation is recommended.    Signed: Phill Myron. Purcell Nails NP [facility] 03/11/2014, 9:21 AM Co-Sign MD  Attending Note Patient seen and discussed with NP Purcell Nails. 48 yo male history of DM, HTN, HL, EtOH admitted with chest pain. EKG with NSR and LAFB, no acute ischemic changes. Trop neg x 3. Given multiple risk factors and description of symptoms there is concern for possible obstructive CAD. Will obtain Lexiscan to evaluate today. His DM is followed as an outpatient at the [facility] clinic. His lipid  profile is not at goal given his history of DM, based on newest guidelines should at least be on a moderate dose statin. Will start lipitor 40mg  daily.  Echo shows normal LV function with no wall motion abnormalities, grade II diastolic dysfunction. Will follow up results of stress test.  Carlyle Dolly MD

## 2014-03-11 NOTE — Progress Notes (Signed)
  Echocardiogram 2D Echocardiogram has been performed.  Tawny Raspberry L Emmaleigh Longo 03/11/2014, 10:30 AM

## 2014-03-11 NOTE — Progress Notes (Addendum)
Inpatient Diabetes Program Recommendations  AACE/ADA: New Consensus Statement on Inpatient Glycemic Control (2013)  Target Ranges:  Prepandial:   less than 140 mg/dL      Peak postprandial:   less than 180 mg/dL (1-2 hours)      Critically ill patients:  140 - 180 mg/dL   Results for Eugene Diaz, Eugene Diaz (MRN [id number]) as of 03/11/2014 10:51  Ref. Range 03/10/2014 21:57 03/11/2014 02:50 03/11/2014 07:19  Glucose-Capillary Latest Range: 70-99 mg/dL 148 (H) 128 (H) 111 (H)   Reason for Visit:Consult  Diabetes history: Newly dx DM2 Outpatient Diabetes medications: Jardiance 25 mg daily, Janumet 50-1000 mg BID Current orders for Inpatient glycemic control: Novolog 0-9 units AC, Novolog 0-5 units HS   JARDIANCE is an SGLT2 inhibitor that blocks SGLT2, reducing renal glucose reabsorption. Side effects of Jardiance include: dehydration, dizziness, weakness, lightheadedness, low blood glucose, and urinary tract infections. The recommended starting dose of Jardiance is 10 mg daily in the am.  Janumet contains 2 prescription medicines: sitagliptin (JANUVIA) and metformin.  Janumet is intended to help the pancreas make more insulin, help the body more effectively use the insulin that it makes, and helps decrease excess sugar that the liver makes. Side effects of Janumet include: diarrhea, constipation, mild nausea, upset stomach; headache, weakness, back pain, joint or mucle pain; or cold symptom.  Note: Noted consult for diabetes coordinator due to patient symptomatic with dizziness since being started on PO DM medications noted above.  In reviewing Jardiance and Janumet, symptoms correlate more with side effects of Jardiance (see above). If Vania Rea is continued as an outpatient, may want to consider decreasing dose to 10 mg daily and have patient follow up with PCP.  Will plan to talk with patient today to discuss diabetes, symptoms, and be sure patient is educated on hypoglycemia signs, symptoms, and  treatment.  03/11/14 @ 15:00 - Talked with the patient and he states that he was dx with DM about 5-6 weeks ago and he was started on the two medications he is currently taking (Jardiance and Janumet).  Patient reports that he checks his blood sugar twice a day ( in am runs in 160's and in the evening it runs in 200's).  Inquired about his A1C and patient states he doesn't know what an A1C is and he does not recall his PCP telling him that value.  Patient reports that over the past 8-10 days he has been feeling weak, dizzy, sweaty, lightheaded, shaky, and like he was going to pass out.  Patient also reports that over the past 8-10 days he has also been aching in his legs, feet, and back.  Discussed impact of nutrition, exercise, stress, and sickness on diabetes control.  Patient states that since he was dx with DM he has made major changes in his diet and has lost 24 pounds over the last 6 weeks.  Encouraged patient to check his blood sugar anytime he has noted symptoms to be sure his blood sugar is not getting too low. Patient reports that he was initially checking his blood sugar 4-5 times per day but when he ran out of strips he was told that his insurance would only pay for strips to test twice a day since that is what his PCP recommended.  Informed patient about Reil-On glucometer at [facility] for $17 and Reli-On test strips 50 for $9 in case patient wanted to pay out of pocket to check his blood sugar more often.  Discussed hypoglycemia along with treatment.  Patient is open to learning and keeping his diabetes controlled but he does not want to continue to have the noted symptoms he has been having for over a week now. Ordered the Living Well with Diabetes booklet and encouraged patient to review the book.  Patient verbalized understanding of information discussed and states that he does not have any further questions related to diabetes at this time.  Would recommend discontinuing Jardiance and have patient  check blood glucose more frequently and follow up with his PCP.    Thanks, Barnie Alderman, RN, MSN, CCRN Diabetes Coordinator Inpatient Diabetes Program [phone] (Team Pager) [phone] (AP office) [phone] [facility] office)

## 2014-03-11 NOTE — Progress Notes (Signed)
UR completed 

## 2014-03-11 NOTE — Progress Notes (Signed)
Stress Lab Nurses Notes - [place] 03/11/2014 Reason for doing test: chest tightness & SOB Type of test: Stress Cardiolite / Inpatient Rm 311 Nurse performing test: Gerrit Halls, RN Nuclear Medicine Tech: Dyanne Carrel Echo Tech: Not Applicable MD performing test: Branch/K.Lawrence NP Family MD: Collene Mares PA-C Test explained and consent signed: yes IV started: Saline lock flushed, No redness or edema and Saline lock from floor Symptoms: fatigue in legs & SOB Treatment/Intervention: None Reason test stopped: fatigue and reached target HR After recovery IV was: No redness or edema and Saline Lock flushed Patient to return to [place]. Med at : 13:10 Patient discharged: Transported back to room 311 via wc Patient's Condition upon discharge was: stable Comments: During test peak BP 159/89 & HR 160.  Recovery BP 113/81 & HR 94.  Symptoms resolved in recovery. Donnajean Lopes

## 2014-03-11 NOTE — Plan of Care (Signed)
Problem: Food- and Nutrition-Related Knowledge Deficit (NB-1.1) Goal: Nutrition education Formal process to instruct or train a patient/client in a skill or to impart knowledge to help patients/clients voluntarily manage or modify food choices and eating behavior to maintain or improve health. Outcome: Adequate for Discharge  RD consulted for nutrition education regarding diabetes.     Lab Results  Component Value Date    HGBA1C 6.5* 03/10/2014    RD provided "Carbohydrate Counting for People with Diabetes" handout from the Academy of Nutrition and Dietetics. Discussed different food groups and their effects on blood sugar, emphasizing carbohydrate-containing foods. Provided list of carbohydrates and recommended serving sizes of common foods.  Discussed importance of controlled and consistent carbohydrate intake throughout the day. Provided examples of ways to balance meals/snacks and encouraged intake of high-fiber, whole grain complex carbohydrates. Teach back method used.  Expect good compliance.  Body mass index is 33.27 kg/(m^2). Pt meets criteria for obesity, class I based on current BMI.  Current diet order is Heart Healthy/ Carb Modified, patient is consuming approximately 100% of meals at this time. Labs and medications reviewed. No further nutrition interventions warranted at this time. RD contact information provided. If additional nutrition issues arise, please re-consult RD.  Jacquilyn Seldon A. Jimmye Norman, RD, LDN Pager: [phone]

## 2014-03-11 NOTE — Progress Notes (Signed)
SATURATION QUALIFICATIONS: (This note is used to comply with regulatory documentation for home oxygen)  Patient Saturations on Room Air at Rest = 100%  Patient Saturations on Room Air while Ambulating = 98%  Patient Saturations on 0 Liters of oxygen while Ambulating = 100%  Please briefly explain why patient needs home oxygen: N/A

## 2014-03-12 ENCOUNTER — Encounter: Payer: Self-pay | Admitting: *Deleted

## 2014-03-12 NOTE — H&P (Signed)
Patient seen and examined. Above note reviewed.    He has been admitted with chest pain and shortness of breath.  Plan will be to rule out ACS with cardiac markers and cardiology consult for stress testing.  [employer]

## 2014-03-12 NOTE — Discharge Summary (Signed)
Patient seen and examined.  Above note reviewed.  Patient has ruled out for ACS with negative cardiac markers. Stress test negative for ischemia. D dimer is negative.  It appears his main symptom is shortness of breath.  Patient may benefit from PFTs as an outpatient.   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 His new diabetes medications may also be playing a role in his symptoms, and he has been advised to discontinue Jardiance until he sees his doctor.  He has been adequately evaluated in the hospital and can be discharged home.  [employer]

## 2014-06-05 ENCOUNTER — Encounter (HOSPITAL_COMMUNITY): Payer: Self-pay | Admitting: Emergency Medicine

## 2014-06-05 ENCOUNTER — Emergency Department (HOSPITAL_COMMUNITY): Payer: BC Managed Care – PPO

## 2014-06-05 ENCOUNTER — Emergency Department (HOSPITAL_COMMUNITY)
Admission: EM | Admit: 2014-06-05 | Discharge: 2014-06-05 | Disposition: A | Discharge status: Home / Self Care | Payer: BC Managed Care – PPO | Attending: Emergency Medicine | Admitting: Emergency Medicine

## 2014-06-05 DIAGNOSIS — Z87891 Personal history of nicotine dependence: Secondary | ICD-10-CM | POA: Insufficient documentation

## 2014-06-05 DIAGNOSIS — Z79899 Other long term (current) drug therapy: Secondary | ICD-10-CM | POA: Insufficient documentation

## 2014-06-05 DIAGNOSIS — R0602 Shortness of breath: Secondary | ICD-10-CM | POA: Insufficient documentation

## 2014-06-05 DIAGNOSIS — E119 Type 2 diabetes mellitus without complications: Secondary | ICD-10-CM | POA: Insufficient documentation

## 2014-06-05 DIAGNOSIS — E7800 Pure hypercholesterolemia, unspecified: Secondary | ICD-10-CM | POA: Insufficient documentation

## 2014-06-05 DIAGNOSIS — R05 Cough: Secondary | ICD-10-CM | POA: Insufficient documentation

## 2014-06-05 DIAGNOSIS — R059 Cough, unspecified: Secondary | ICD-10-CM | POA: Insufficient documentation

## 2014-06-05 DIAGNOSIS — I10 Essential (primary) hypertension: Secondary | ICD-10-CM | POA: Insufficient documentation

## 2014-06-05 LAB — CBC WITH DIFFERENTIAL/PLATELET
Basophils Absolute: 0.1 10*3/uL (ref 0.0–0.1)
Basophils Relative: 1 % (ref 0–1)
Eosinophils Absolute: 0.2 10*3/uL (ref 0.0–0.7)
Eosinophils Relative: 4 % (ref 0–5)
HCT: 40.5 % (ref 39.0–52.0)
Hemoglobin: 14.4 g/dL (ref 13.0–17.0)
Lymphocytes Relative: 42 % (ref 12–46)
Lymphs Abs: 1.8 10*3/uL (ref 0.7–4.0)
MCH: 34.3 pg — ABNORMAL HIGH (ref 26.0–34.0)
MCHC: 35.6 g/dL (ref 30.0–36.0)
MCV: 96.4 fL (ref 78.0–100.0)
Monocytes Absolute: 0.3 10*3/uL (ref 0.1–1.0)
Monocytes Relative: 6 % (ref 3–12)
Neutro Abs: 2 10*3/uL (ref 1.7–7.7)
Neutrophils Relative %: 47 % (ref 43–77)
Platelets: 133 10*3/uL — ABNORMAL LOW (ref 150–400)
RBC: 4.2 MIL/uL — ABNORMAL LOW (ref 4.22–5.81)
RDW: 13.2 % (ref 11.5–15.5)
WBC: 4.3 10*3/uL (ref 4.0–10.5)

## 2014-06-05 LAB — I-STAT TROPONIN, ED: Troponin i, poc: 0 ng/mL (ref 0.00–0.08)

## 2014-06-05 LAB — BASIC METABOLIC PANEL
Anion gap: 13 (ref 5–15)
BUN: 12 mg/dL (ref 6–23)
CO2: 23 mEq/L (ref 19–32)
Calcium: 9.5 mg/dL (ref 8.4–10.5)
Chloride: 100 mEq/L (ref 96–112)
Creatinine, Ser: 1.02 mg/dL (ref 0.50–1.35)
GFR calc Af Amer: >90 mL/min (ref >90)
GFR calc non Af Amer: 85 mL/min — ABNORMAL LOW (ref >90)
Glucose, Bld: 162 mg/dL — ABNORMAL HIGH (ref 70–99)
Potassium: 3.8 mEq/L (ref 3.7–5.3)
Sodium: 136 mEq/L — ABNORMAL LOW (ref 137–147)

## 2014-06-05 MED ORDER — PREDNISONE 10 MG PO TABS: ORAL_TABLET | ORAL | Status: DC

## 2014-06-05 MED ORDER — ALBUTEROL SULFATE (2.5 MG/3ML) 0.083% IN NEBU: 2.5000 mg | INHALATION_SOLUTION | Freq: Once | RESPIRATORY_TRACT | Status: DC

## 2014-06-05 MED ORDER — METHYLPREDNISOLONE SODIUM SUCC 125 MG IJ SOLR
125.0000 mg | Freq: Once | INTRAMUSCULAR | Status: AC
  Administered 2014-06-05: 125 mg via INTRAVENOUS
  Filled 2014-06-05: qty 2

## 2014-06-05 MED ORDER — IPRATROPIUM-ALBUTEROL 0.5-2.5 (3) MG/3ML IN SOLN
3.0000 mL | Freq: Once | RESPIRATORY_TRACT | Status: AC
  Administered 2014-06-05: 3 mL via RESPIRATORY_TRACT
  Filled 2014-06-05: qty 3

## 2014-06-05 MED ORDER — SODIUM CHLORIDE 0.9 % IV SOLN
INTRAVENOUS | Status: DC
  Administered 2014-06-05: 12:00:00 via INTRAVENOUS

## 2014-06-05 NOTE — ED Provider Notes (Signed)
Medical screening examination/treatment/procedure(s) were conducted as a shared visit with non-physician practitioner(s) and myself.  I personally evaluated the patient during the encounter.   EKG Interpretation   Date/Time:  Thursday June 05 2014 10:55:11 EDT Ventricular Rate:  75 PR Interval:  180 QRS Duration: 93 QT Interval:  383 QTC Calculation: 428 R Axis:   -15 Text Interpretation:  Sinus rhythm Low voltage, extremity leads Abnormal  R-wave progression, late transition Confirmed by Pearse Shiffler,  DO, Masiyah Jorstad  [phone]) on 06/05/2014 11:13:51 AM      Pt is a 48 y.o. male with history of hypertension, diabetes, prior tobacco use who presents emergency department with several weeks of constant shortness of breath and diffuse chest tightness. He was seen in the hospital in May 2015 and had negative cardiac enzymes, stress test and d-dimer. At that time he was discharged home with inhalers and told to followup with his primary care physician for pulmonary function tests. He states he has not had any pulmonary function tests are seen a pulmonologist. He has an appointment with his PCP tomorrow. Per minute level provider, patient had decreased breath sounds and diminished aeration. He has received albuterol and Solu-Medrol and states to me he is feeling much better. He is not hypoxic and is able to ambulate without hypoxia. No respiratory distress or increased work of breathing. His lungs are clear to auscultation with good aeration. He has had normal labs, negative troponin and a clear chest x-ray. Suspect COPD versus other lung disease. Given he has had pain and symptoms for several weeks constantly and has a negative troponin a recent negative stress test, I do not feel he needs further workup for this at this time as I doubt ACS. D-dimer recently was also negative. Doubt PE. He appears very comfortable on exam and has equal pulses in all of his extremities and no back pain, doubt dissection. We'll  discharge home with prescription for prednisone. He has an appointment with his primary care physician tomorrow. Discussed strict return precautions and supportive care instructions. He verbalizes understanding and is comfortable plan.  [place], DO 06/05/14 1346

## 2014-06-05 NOTE — Discharge Instructions (Signed)
We have given you IV steroids today. We are sending you home with a prescription of steroids. Continue to use your inhalers as directed. Follow up with your PCP tomorrow. Return here as needed.   Shortness of Breath Shortness of breath means you have trouble breathing. Shortness of breath needs medical care right away. HOME CARE   Do not smoke.  Avoid being around chemicals or things (paint fumes, dust) that may bother your breathing.  Rest as needed. Slowly begin your normal activities.  Only take medicines as told by your doctor.  Keep all doctor visits as told. GET HELP RIGHT AWAY IF:   Your shortness of breath gets worse.  You feel lightheaded, pass out (faint), or have a cough that is not helped by medicine.  You cough up blood.  You have pain with breathing.  You have pain in your chest, arms, shoulders, or belly (abdomen).  You have a fever.  You cannot walk up stairs or exercise the way you normally do.  You do not get better in the time expected.  You have a hard time doing normal activities even with rest.  You have problems with your medicines.  You have any new symptoms. MAKE SURE YOU:  Understand these instructions.  Will watch your condition.  Will get help right away if you are not doing well or get worse. Document Released: 04/04/2008 Document Revised: 10/22/2013 Document Reviewed: 01/02/2012 [facility] Patient Information 2015 [place], [place]. This information is not intended to replace advice given to you by your health care provider. Make sure you discuss any questions you have with your health care provider.

## 2014-06-05 NOTE — ED Notes (Signed)
Delano Metz, NP at bedside

## 2014-06-05 NOTE — ED Notes (Signed)
States he feels better in the cool air, but still feels short of breath.  States chest is not at tight now.

## 2014-06-05 NOTE — ED Notes (Signed)
IV d/c'd to left AC, catheter intact and site WNL, dsg applied to site

## 2014-06-05 NOTE — ED Provider Notes (Signed)
CSN: [id number]     Arrival date & time 06/05/14  1034 History   First MD Initiated Contact with Patient 06/05/14 1052     Chief Complaint  Patient presents with  . Shortness of Breath     (Consider location/radiation/quality/duration/timing/severity/associated sxs/prior Treatment) Patient is a 48 y.o. male presenting with shortness of breath. The history is provided by the patient.  Shortness of Breath Severity:  Moderate Onset quality:  Gradual Duration:  3 weeks Timing:  Constant Progression:  Worsening Relieved by:  Nothing Worsened by:  Activity Ineffective treatments:  Inhaler Associated symptoms: cough   Associated symptoms: no abdominal pain, no chest pain, no fever, no hemoptysis, no neck pain, no sore throat, no sputum production and no syncope    Eugene Diaz is a 48 y.o. male who presents to the ED with shortness of breath that started 3 weeks ago. He has been using his inhalers without relief. He is using Advair and Albuterol. He tried to work yesterday and his boss sent him home because he was having so much problem with breathing. He used his wife's oxygen to help him feel better yesterday.   Past Medical History  Diagnosis Date  . Diabetes mellitus without complication 10/3242  . Hypertension   . Hypercholesterolemia    Past Surgical History  Procedure Laterality Date  . Left arm      Left arm repair   Family History  Problem Relation Age of Onset  . Heart attack Mother   . Cancer Father    History  Substance Use Topics  . Smoking status: Former Research scientist (life sciences)  . Smokeless tobacco: Not on file  . Alcohol Use: 25.0 oz/week    50 drink(s) per week     Comment: occasional    Review of Systems  Constitutional: Negative for fever.  HENT: Negative for sore throat.   Respiratory: Positive for cough and shortness of breath. Negative for hemoptysis and sputum production.   Cardiovascular: Negative for chest pain and syncope.  Gastrointestinal: Negative for  abdominal pain.  Musculoskeletal: Negative for neck pain.      Allergies  Review of patient's allergies indicates no known allergies.  Home Medications   Prior to Admission medications   Medication Sig Start Date End Date Taking? Authorizing Provider  albuterol (PROVENTIL HFA;VENTOLIN HFA) 108 (90 BASE) MCG/ACT inhaler Inhale 2 puffs into the lungs every 6 (six) hours as needed for wheezing or shortness of breath. 03/11/14   Radene Gunning, NP  aspirin EC 81 MG tablet Take 1 tablet (81 mg total) by mouth daily. 03/11/14   Radene Gunning, NP  atorvastatin (LIPITOR) 40 MG tablet Take 1 tablet (40 mg total) by mouth daily at 6 PM. 03/11/14   Radene Gunning, NP  cyclobenzaprine (FLEXERIL) 5 MG tablet Take 1 tablet (5 mg total) by mouth 3 (three) times daily as needed for muscle spasms. 03/11/14   Radene Gunning, NP  folic acid (FOLVITE) 1 MG tablet Take 1 tablet (1 mg total) by mouth daily. 03/11/14   Radene Gunning, NP  lisinopril (PRINIVIL,ZESTRIL) 20 MG tablet Take 20 mg by mouth daily.    Historical Provider, MD  Multiple Vitamin (MULTIVITAMIN WITH MINERALS) TABS tablet Take 1 tablet by mouth daily. 03/11/14   Radene Gunning, NP  sitaGLIPtin-metformin (JANUMET) 50-1000 MG per tablet Take 1 tablet by mouth daily.    Historical Provider, MD   BP 123/85  Pulse 82  Temp(Src) 98.1 F (36.7 C)  Resp 22  Ht 6\' 2"  (1.88 m)  Wt 259 lb (117.482 kg)  BMI 33.24 kg/m2  SpO2 97% Physical Exam  Nursing note and vitals reviewed. Constitutional: He is oriented to person, place, and time. He appears well-developed and well-nourished.  Eyes: EOM are normal.  Neck: Neck supple.  Pulmonary/Chest: Accessory muscle usage present. He has decreased breath sounds.  Abdominal: Soft. There is no tenderness.  Musculoskeletal: Normal range of motion.  Neurological: He is alert and oriented to person, place, and time. No cranial nerve deficit.  Skin: Skin is warm and dry.  Psychiatric: He has a normal mood and affect.  His behavior is normal.    Dg Chest 2 View  06/05/2014   CLINICAL DATA:  Shortness of Breath  EXAM: CHEST  2 VIEW  COMPARISON:  03/10/2014  FINDINGS: Cardiomediastinal silhouette is stable. No acute infiltrate or pleural effusion. No pulmonary edema. Bony thorax is unremarkable.  IMPRESSION: No active cardiopulmonary disease.   Electronically Signed   By: Lahoma Crocker M.D.   On: 06/05/2014 11:17    ED Course  Procedures  Duo neb, cxr. Dr. Leonides Schanz in to examine the patient.   After neb treatment patient feeling a lot better and better air movement. Albuterol treatment repeated.  Solumedrol 125 mg IV MDM  48 y.o. male with shortness of breath x 3 weeks. Stable for discharge without pneumonia or any acute finding on cxr. Patient has appointment with PCP tomorrow. He will continue his inhaler use and we will add steroid burst. Discussed with the patient and all questioned fully answered. He will return if any problems arise.    Medication List    TAKE these medications       predniSONE 10 MG tablet  Commonly known as:  DELTASONE  Starting tomorrow take 2 tablets BID      ASK your doctor about these medications       ADVAIR DISKUS 250-50 MCG/DOSE Aepb  Generic drug:  Fluticasone-Salmeterol  Inhale 1 puff into the lungs 2 (two) times daily.     albuterol 108 (90 BASE) MCG/ACT inhaler  Commonly known as:  PROVENTIL HFA;VENTOLIN HFA  Inhale 2 puffs into the lungs every 6 (six) hours as needed for wheezing or shortness of breath.     aspirin EC 81 MG tablet  Take 1 tablet (81 mg total) by mouth daily.     HYDROcodone-acetaminophen 5-325 MG per tablet  Commonly known as:  NORCO/VICODIN  Take 1 tablet by mouth every 6 (six) hours as needed for moderate pain.     lisinopril 20 MG tablet  Commonly known as:  PRINIVIL,ZESTRIL  Take 20 mg by mouth daily.     sitaGLIPtin-metformin 50-1000 MG per tablet  Commonly known as:  JANUMET  Take 1 tablet by mouth daily.      sulfamethoxazole-trimethoprim 800-160 MG per tablet  Commonly known as:  BACTRIM DS  Take 1 tablet by mouth 2 (two) times daily.           [place], [place] 06/05/14 [phone]

## 2014-06-05 NOTE — ED Notes (Signed)
Pt c/o sob x 3 weeks, worse this week with no relief from inhalers.

## 2014-06-05 NOTE — ED Notes (Signed)
Pt ambulated around nurses desk without difficultly, RA sats 98-99% while ambulating

## 2014-07-30 ENCOUNTER — Encounter (HOSPITAL_COMMUNITY): Payer: Self-pay | Admitting: Pharmacy Technician

## 2014-07-30 NOTE — H&P (Signed)
  NTS SOAP Note  Vital Signs:  Vitals as of: 04/13/4314: Systolic 400: Diastolic 86: Heart Rate 78: Temp 96.9F: Height 64ft 2in: Weight 274Lbs 0 Ounces: Pain Level 7: BMI 35.18  BMI : 35.18 kg/m2  Subjective: This 48 year old male presents for of a pilonidal cyst.  Had it i and d in recent past.  Continues to cause pain.  No recent drainage noted.   Review of Symptoms:  Constitutional:weakness Head:unremarkable Eyes:blurred vision bilateral Nose/Mouth/Throat:unremarkable Cardiovascular:  unremarkable Respiratory:dyspnea Gastrointestinheartburn Genitourinary:frequency joint and back pain Skin:unremarkable Hematolgic/Lymphatic:unremarkable   Allergic/Immunologic:unremarkable   Past Medical History:  Reviewed  Past Medical History  Surgical History: unknown Medical Problems: HTN,  COPD,  anxiety,  NIDDM,  high cholesterol Allergies: nkda Medications: lisinopril,  cyclobenzapine,  janumet,  xanax,  advair,  norco,  atorvastatin    Social History:Reviewed  Social History  Preferred Language: English Race:  White Ethnicity: Not Hispanic / Latino Age: 12 year Marital Status:  M Alcohol: 6 pack daily   Smoking Status: Never smoker reviewed on 07/29/2014 Functional Status reviewed on 07/29/2014 ------------------------------------------------ Bathing: Normal Cooking: Normal Dressing: Normal Driving: Normal Eating: Normal Managing Meds: Normal Oral Care: Normal Shopping: Normal Toileting: Normal Transferring: Normal Walking: Normal Cognitive Status reviewed on 07/29/2014 ------------------------------------------------ Attention: Normal Decision Making: Normal Language: Normal Memory: Normal Motor: Normal Perception: Normal Problem Solving: Normal Visual and Spatial: Normal   Family History:Reviewed  Family Health History Mother, Living; Heart disease;  Father, Deceased; Cancer unspecified;     Objective  Information: General:Well appearing, well nourished in no distress. :Pilonidal cyst with induration centrally,  two punctate holes noted on either side,  Involves large area of coccyx. Heart:RRR, no murmur Lungs:  CTA bilaterally, no wheezes, rhonchi, rales.  Breathing unlabored.  Assessment:Pilonidal cyst  Diagnoses: 685.1  L05.91 Cyst - pilonidal (Pilonidal cyst without abscess)  Procedures: 86761 - OFFICE OUTPATIENT NEW 30 MINUTES    Plan:  Scheduled for excision of pilonidal cyst on 08/08/14.   Patient Education:Alternative treatments to surgery were discussed with patient (and family).  Risks and benefits  of procedure including bleeding,  infection,  and wound separation were fully explained to the patient (and family) who gave informed consent. Patient/family questions were addressed.  Follow-up:Pending Surgery

## 2014-08-04 ENCOUNTER — Encounter (HOSPITAL_COMMUNITY): Payer: Self-pay

## 2014-08-04 NOTE — Patient Instructions (Signed)
Eugene Diaz  08/04/2014   Your procedure is scheduled on:  08/08/2014  Report to [facility] at 7:30 AM.  Call this number if you have problems the morning of surgery: [phone]   Remember:   Do not eat food or drink liquids after midnight.   Take these medicines the morning of surgery with A SIP OF WATER:        Advair, Albuterol (take that am and bring with you, as well), Xanax, Hydrocodone, Lisinopril   DO NOT TAKE JANUMET   Do not wear jewelry, make-up or nail polish.  Do not wear lotions, powders, or perfumes. You may wear deodorant.  Do not shave 48 hours prior to surgery. Men may shave face and neck.  Do not bring valuables to the hospital.  [facility] is not responsible for any belongings or valuables.               Contacts, dentures or bridgework may not be worn into surgery.  Leave suitcase in the car. After surgery it may be brought to your room.  For patients admitted to the hospital, discharge time is determined by your treatment team.               Patients discharged the day of surgery will not be allowed to drive home.  Name and phone number of your driver:   Special Instructions: Shower using CHG 2 nights before surgery and the night before surgery.  If you shower the day of surgery use CHG.  Use special wash - you have one bottle of CHG for all showers.  You should use approximately 1/3 of the bottle for each shower.   Please read over the following fact sheets that you were given: Surgical Site Infection Prevention and Anesthesia Post-op Instructions   PATIENT INSTRUCTIONS POST-ANESTHESIA  IMMEDIATELY FOLLOWING SURGERY:  Do not drive or operate machinery for the first twenty four hours after surgery.  Do not make any important decisions for twenty four hours after surgery or while taking narcotic pain medications or sedatives.  If you develop intractable nausea and vomiting or a severe headache please notify your doctor immediately.  FOLLOW-UP:  Please  make an appointment with your surgeon as instructed. You do not need to follow up with anesthesia unless specifically instructed to do so.  WOUND CARE INSTRUCTIONS (if applicable):  Keep a dry clean dressing on the anesthesia/puncture wound site if there is drainage.  Once the wound has quit draining you may leave it open to air.  Generally you should leave the bandage intact for twenty four hours unless there is drainage.  If the epidural site drains for more than 36-48 hours please call the anesthesia department.  QUESTIONS?:  Please feel free to call your physician or the hospital operator if you have any questions, and they will be happy to assist you.      Pilonidal Cyst A pilonidal cyst occurs when hairs get trapped (ingrown) beneath the skin in the crease between the buttocks over your sacrum (the bone under that crease). Pilonidal cysts are most common in young men with a lot of body hair. When the cyst is ruptured (breaks) or leaking, fluid from the cyst may cause burning and itching. If the cyst becomes infected, it causes a painful swelling filled with pus (abscess). The pus and trapped hairs need to be removed (often by lancing) so that the infection can heal. However, recurrence is common and an operation may be needed to remove  the cyst. HOME CARE INSTRUCTIONS   If the cyst was NOT INFECTED:  Keep the area clean and dry. Bathe or shower daily. Wash the area well with a germ-killing soap. Warm tub baths may help prevent infection and help with drainage. Dry the area well with a towel.  Avoid tight clothing to keep area as moisture free as possible.  Keep area between buttocks as free of hair as possible. A depilatory may be used.  If the cyst WAS INFECTED and needed to be drained:  Your caregiver packed the wound with gauze to keep the wound open. This allows the wound to heal from the inside outwards and continue draining.  Return for a wound check in 1 day or as suggested.  If  you take tub baths or showers, repack the wound with gauze following them. Sponge baths (at the sink) are a good alternative.  If an antibiotic was ordered to fight the infection, take as directed.  Only take over-the-counter or prescription medicines for pain, discomfort, or fever as directed by your caregiver.  After the drain is removed, use sitz baths for 20 minutes 4 times per day. Clean the wound gently with mild unscented soap, pat dry, and then apply a dry dressing. SEEK MEDICAL CARE IF:   You have increased pain, swelling, redness, drainage, or bleeding from the area.  You have a fever.  You have muscles aches, dizziness, or a general ill feeling. Document Released: 10/14/2000 Document Revised: 01/09/2012 Document Reviewed: 12/12/2008 [facility] Patient Information 2015 [place], [place]. This information is not intended to replace advice given to you by your health care provider. Make sure you discuss any questions you have with your health care provider.

## 2014-08-05 ENCOUNTER — Encounter (HOSPITAL_COMMUNITY)
Admission: RE | Admit: 2014-08-05 | Discharge: 2014-08-05 | Disposition: A | Discharge status: Home / Self Care | Payer: BC Managed Care – PPO | Source: Ambulatory Visit | Attending: General Surgery | Admitting: General Surgery

## 2014-08-05 ENCOUNTER — Encounter (HOSPITAL_COMMUNITY): Payer: Self-pay

## 2014-08-05 DIAGNOSIS — E119 Type 2 diabetes mellitus without complications: Secondary | ICD-10-CM | POA: Diagnosis not present

## 2014-08-05 DIAGNOSIS — K219 Gastro-esophageal reflux disease without esophagitis: Secondary | ICD-10-CM | POA: Diagnosis not present

## 2014-08-05 DIAGNOSIS — I10 Essential (primary) hypertension: Secondary | ICD-10-CM | POA: Diagnosis not present

## 2014-08-05 DIAGNOSIS — F1020 Alcohol dependence, uncomplicated: Secondary | ICD-10-CM | POA: Diagnosis not present

## 2014-08-05 DIAGNOSIS — J45909 Unspecified asthma, uncomplicated: Secondary | ICD-10-CM | POA: Diagnosis not present

## 2014-08-05 DIAGNOSIS — L0591 Pilonidal cyst without abscess: Secondary | ICD-10-CM | POA: Diagnosis not present

## 2014-08-05 HISTORY — DX: Peripheral vascular disease, unspecified: I73.9

## 2014-08-05 HISTORY — DX: Unspecified asthma, uncomplicated: J45.909

## 2014-08-05 LAB — CBC WITH DIFFERENTIAL/PLATELET
Basophils Absolute: 0 10*3/uL (ref 0.0–0.1)
Basophils Relative: 1 % (ref 0–1)
Eosinophils Absolute: 0.3 10*3/uL (ref 0.0–0.7)
Eosinophils Relative: 5 % (ref 0–5)
HCT: 41.5 % (ref 39.0–52.0)
Hemoglobin: 14.9 g/dL (ref 13.0–17.0)
Lymphocytes Relative: 39 % (ref 12–46)
Lymphs Abs: 2.6 10*3/uL (ref 0.7–4.0)
MCH: 35.1 pg — ABNORMAL HIGH (ref 26.0–34.0)
MCHC: 35.9 g/dL (ref 30.0–36.0)
MCV: 97.9 fL (ref 78.0–100.0)
Monocytes Absolute: 0.4 10*3/uL (ref 0.1–1.0)
Monocytes Relative: 6 % (ref 3–12)
Neutro Abs: 3.3 10*3/uL (ref 1.7–7.7)
Neutrophils Relative %: 49 % (ref 43–77)
Platelets: 138 10*3/uL — ABNORMAL LOW (ref 150–400)
RBC: 4.24 MIL/uL (ref 4.22–5.81)
RDW: 13.6 % (ref 11.5–15.5)
WBC: 6.6 10*3/uL (ref 4.0–10.5)

## 2014-08-05 LAB — BASIC METABOLIC PANEL
Anion gap: 17 — ABNORMAL HIGH (ref 5–15)
BUN: 11 mg/dL (ref 6–23)
CO2: 21 mEq/L (ref 19–32)
Calcium: 9.4 mg/dL (ref 8.4–10.5)
Chloride: 100 mEq/L (ref 96–112)
Creatinine, Ser: 1.23 mg/dL (ref 0.50–1.35)
GFR calc Af Amer: 78 mL/min — ABNORMAL LOW (ref >90)
GFR calc non Af Amer: 67 mL/min — ABNORMAL LOW (ref >90)
Glucose, Bld: 214 mg/dL — ABNORMAL HIGH (ref 70–99)
Potassium: 3.8 mEq/L (ref 3.7–5.3)
Sodium: 138 mEq/L (ref 137–147)

## 2014-08-08 ENCOUNTER — Encounter (HOSPITAL_COMMUNITY): Payer: Self-pay | Admitting: *Deleted

## 2014-08-08 ENCOUNTER — Ambulatory Visit (HOSPITAL_COMMUNITY)
Admission: RE | Admit: 2014-08-08 | Discharge: 2014-08-08 | Disposition: A | Discharge status: Home / Self Care | Payer: BC Managed Care – PPO | Source: Ambulatory Visit | Attending: General Surgery | Admitting: General Surgery

## 2014-08-08 ENCOUNTER — Ambulatory Visit (HOSPITAL_COMMUNITY): Payer: BC Managed Care – PPO | Admitting: Anesthesiology

## 2014-08-08 ENCOUNTER — Encounter (HOSPITAL_COMMUNITY): Payer: BC Managed Care – PPO | Admitting: Anesthesiology

## 2014-08-08 ENCOUNTER — Encounter (HOSPITAL_COMMUNITY)
Admission: RE | Disposition: A | Discharge status: Home / Self Care | Payer: Self-pay | Source: Ambulatory Visit | Attending: General Surgery

## 2014-08-08 DIAGNOSIS — I10 Essential (primary) hypertension: Secondary | ICD-10-CM | POA: Insufficient documentation

## 2014-08-08 DIAGNOSIS — L0591 Pilonidal cyst without abscess: Secondary | ICD-10-CM | POA: Diagnosis not present

## 2014-08-08 DIAGNOSIS — K219 Gastro-esophageal reflux disease without esophagitis: Secondary | ICD-10-CM | POA: Insufficient documentation

## 2014-08-08 DIAGNOSIS — J45909 Unspecified asthma, uncomplicated: Secondary | ICD-10-CM | POA: Insufficient documentation

## 2014-08-08 DIAGNOSIS — E119 Type 2 diabetes mellitus without complications: Secondary | ICD-10-CM | POA: Insufficient documentation

## 2014-08-08 DIAGNOSIS — F1020 Alcohol dependence, uncomplicated: Secondary | ICD-10-CM | POA: Insufficient documentation

## 2014-08-08 HISTORY — PX: PILONIDAL CYST EXCISION: SHX744

## 2014-08-08 LAB — GLUCOSE, CAPILLARY
Glucose-Capillary: 175 mg/dL — ABNORMAL HIGH (ref 70–99)
Glucose-Capillary: 196 mg/dL — ABNORMAL HIGH (ref 70–99)

## 2014-08-08 SURGERY — EXCISION, PILONIDAL CYST, EXTENSIVE
Anesthesia: General | Site: Buttocks

## 2014-08-08 MED ORDER — SODIUM CHLORIDE 0.9 % IN NEBU
INHALATION_SOLUTION | RESPIRATORY_TRACT | Status: AC
  Filled 2014-08-08: qty 3

## 2014-08-08 MED ORDER — PROPOFOL 10 MG/ML IV EMUL
INTRAVENOUS | Status: AC
  Filled 2014-08-08: qty 20

## 2014-08-08 MED ORDER — CHLORHEXIDINE GLUCONATE 4 % EX LIQD: 1.0000 "application " | Freq: Once | CUTANEOUS | Status: DC

## 2014-08-08 MED ORDER — HYDROCODONE-ACETAMINOPHEN 5-325 MG PO TABS: 1.0000 | ORAL_TABLET | ORAL | Status: DC | PRN

## 2014-08-08 MED ORDER — ONDANSETRON HCL 4 MG/2ML IJ SOLN
INTRAMUSCULAR | Status: AC
  Filled 2014-08-08: qty 2

## 2014-08-08 MED ORDER — LIDOCAINE HCL (CARDIAC) 20 MG/ML IV SOLN
INTRAVENOUS | Status: DC | PRN
  Administered 2014-08-08: 50 mg via INTRAVENOUS

## 2014-08-08 MED ORDER — ROCURONIUM BROMIDE 50 MG/5ML IV SOLN
INTRAVENOUS | Status: AC
  Filled 2014-08-08: qty 1

## 2014-08-08 MED ORDER — MIDAZOLAM HCL 2 MG/2ML IJ SOLN
INTRAMUSCULAR | Status: AC
  Filled 2014-08-08: qty 2

## 2014-08-08 MED ORDER — ALBUTEROL SULFATE (2.5 MG/3ML) 0.083% IN NEBU
2.5000 mg | INHALATION_SOLUTION | Freq: Once | RESPIRATORY_TRACT | Status: AC
  Administered 2014-08-08: 2.5 mg via RESPIRATORY_TRACT

## 2014-08-08 MED ORDER — PROPOFOL 10 MG/ML IV BOLUS
INTRAVENOUS | Status: DC | PRN
  Administered 2014-08-08: 200 mg via INTRAVENOUS

## 2014-08-08 MED ORDER — FENTANYL CITRATE 0.05 MG/ML IJ SOLN: 25.0000 ug | INTRAMUSCULAR | Status: DC | PRN

## 2014-08-08 MED ORDER — FENTANYL CITRATE 0.05 MG/ML IJ SOLN
INTRAMUSCULAR | Status: AC
  Filled 2014-08-08: qty 5

## 2014-08-08 MED ORDER — BUPIVACAINE HCL (PF) 0.5 % IJ SOLN
INTRAMUSCULAR | Status: AC
  Filled 2014-08-08: qty 30

## 2014-08-08 MED ORDER — FENTANYL CITRATE 0.05 MG/ML IJ SOLN
INTRAMUSCULAR | Status: DC | PRN
  Administered 2014-08-08 (×5): 50 ug via INTRAVENOUS

## 2014-08-08 MED ORDER — GLYCOPYRROLATE 0.2 MG/ML IJ SOLN
INTRAMUSCULAR | Status: AC
Start: 2014-08-08 — End: 2014-08-08
  Filled 2014-08-08: qty 1

## 2014-08-08 MED ORDER — ALBUTEROL SULFATE (2.5 MG/3ML) 0.083% IN NEBU
INHALATION_SOLUTION | RESPIRATORY_TRACT | Status: AC
  Filled 2014-08-08: qty 3

## 2014-08-08 MED ORDER — POVIDONE-IODINE 10 % EX OINT
TOPICAL_OINTMENT | CUTANEOUS | Status: AC
  Filled 2014-08-08: qty 1

## 2014-08-08 MED ORDER — BUPIVACAINE HCL (PF) 0.5 % IJ SOLN
INTRAMUSCULAR | Status: DC | PRN
  Administered 2014-08-08: 10 mL

## 2014-08-08 MED ORDER — CEFAZOLIN SODIUM-DEXTROSE 2-3 GM-% IV SOLR
2.0000 g | INTRAVENOUS | Status: AC
  Administered 2014-08-08: 2 g via INTRAVENOUS

## 2014-08-08 MED ORDER — GLYCOPYRROLATE 0.2 MG/ML IJ SOLN
0.2000 mg | Freq: Once | INTRAMUSCULAR | Status: AC
  Administered 2014-08-08: 0.2 mg via INTRAVENOUS

## 2014-08-08 MED ORDER — MIDAZOLAM HCL 2 MG/2ML IJ SOLN
1.0000 mg | INTRAMUSCULAR | Status: DC | PRN
  Administered 2014-08-08 (×2): 2 mg via INTRAVENOUS
  Filled 2014-08-08: qty 2

## 2014-08-08 MED ORDER — ROCURONIUM BROMIDE 100 MG/10ML IV SOLN
INTRAVENOUS | Status: DC | PRN
  Administered 2014-08-08: 10 mg via INTRAVENOUS

## 2014-08-08 MED ORDER — SUCCINYLCHOLINE CHLORIDE 20 MG/ML IJ SOLN
INTRAMUSCULAR | Status: AC
  Filled 2014-08-08: qty 1

## 2014-08-08 MED ORDER — POVIDONE-IODINE 10 % OINT PACKET
TOPICAL_OINTMENT | CUTANEOUS | Status: DC | PRN
  Administered 2014-08-08: 1 via TOPICAL

## 2014-08-08 MED ORDER — LACTATED RINGERS IV SOLN
INTRAVENOUS | Status: DC
  Administered 2014-08-08 (×2): via INTRAVENOUS

## 2014-08-08 MED ORDER — FENTANYL CITRATE 0.05 MG/ML IJ SOLN
INTRAMUSCULAR | Status: AC
  Filled 2014-08-08: qty 2

## 2014-08-08 MED ORDER — CEFAZOLIN SODIUM-DEXTROSE 2-3 GM-% IV SOLR
INTRAVENOUS | Status: AC
  Filled 2014-08-08: qty 50

## 2014-08-08 MED ORDER — ONDANSETRON HCL 4 MG/2ML IJ SOLN: 4.0000 mg | Freq: Once | INTRAMUSCULAR | Status: DC | PRN

## 2014-08-08 MED ORDER — DEXAMETHASONE SODIUM PHOSPHATE 4 MG/ML IJ SOLN
INTRAMUSCULAR | Status: AC
  Filled 2014-08-08: qty 1

## 2014-08-08 MED ORDER — 0.9 % SODIUM CHLORIDE (POUR BTL) OPTIME
TOPICAL | Status: DC | PRN
  Administered 2014-08-08: 1000 mL

## 2014-08-08 MED ORDER — SUCCINYLCHOLINE CHLORIDE 20 MG/ML IJ SOLN
INTRAMUSCULAR | Status: DC | PRN
  Administered 2014-08-08: 140 mg via INTRAVENOUS

## 2014-08-08 MED ORDER — ALBUTEROL SULFATE (2.5 MG/3ML) 0.083% IN NEBU: 2.5000 mg | INHALATION_SOLUTION | Freq: Once | RESPIRATORY_TRACT | Status: DC

## 2014-08-08 MED ORDER — ONDANSETRON HCL 4 MG/2ML IJ SOLN
4.0000 mg | Freq: Once | INTRAMUSCULAR | Status: AC
  Administered 2014-08-08: 4 mg via INTRAVENOUS

## 2014-08-08 MED ORDER — DEXAMETHASONE SODIUM PHOSPHATE 4 MG/ML IJ SOLN
4.0000 mg | Freq: Once | INTRAMUSCULAR | Status: AC
  Administered 2014-08-08: 4 mg via INTRAVENOUS

## 2014-08-08 MED ORDER — LIDOCAINE HCL (PF) 1 % IJ SOLN
INTRAMUSCULAR | Status: AC
  Filled 2014-08-08: qty 5

## 2014-08-08 MED ORDER — KETOROLAC TROMETHAMINE 30 MG/ML IJ SOLN
30.0000 mg | Freq: Once | INTRAMUSCULAR | Status: AC
  Administered 2014-08-08: 30 mg via INTRAVENOUS
  Filled 2014-08-08: qty 1

## 2014-08-08 SURGICAL SUPPLY — 34 items
BAG HAMPER (MISCELLANEOUS) ×2 IMPLANT
CLOTH BEACON ORANGE TIMEOUT ST (SAFETY) ×2 IMPLANT
COVER LIGHT HANDLE STERIS (MISCELLANEOUS) ×4 IMPLANT
DECANTER SPIKE VIAL GLASS SM (MISCELLANEOUS) ×2 IMPLANT
DURAPREP 26ML APPLICATOR (WOUND CARE) ×1 IMPLANT
ELECT REM PT RETURN 9FT ADLT (ELECTROSURGICAL) ×2
ELECTRODE REM PT RTRN 9FT ADLT (ELECTROSURGICAL) ×1 IMPLANT
FORMALIN 10 PREFIL 480ML (MISCELLANEOUS) ×1 IMPLANT
GAUZE SPONGE 4X4 12PLY STRL (GAUZE/BANDAGES/DRESSINGS) ×2 IMPLANT
GAUZE XEROFORM 5X9 LF (GAUZE/BANDAGES/DRESSINGS) IMPLANT
GLOVE SURG SS PI 7.5 STRL IVOR (GLOVE) ×2 IMPLANT
GOWN STRL REUS W/ TWL XL LVL3 (GOWN DISPOSABLE) ×1 IMPLANT
GOWN STRL REUS W/TWL LRG LVL3 (GOWN DISPOSABLE) ×4 IMPLANT
GOWN STRL REUS W/TWL XL LVL3 (GOWN DISPOSABLE) ×2
KIT ROOM TURNOVER APOR (KITS) ×2 IMPLANT
MANIFOLD NEPTUNE II (INSTRUMENTS) ×2 IMPLANT
NDL HYPO 18GX1.5 BLUNT FILL (NEEDLE) ×1 IMPLANT
NDL HYPO 25X1 1.5 SAFETY (NEEDLE) IMPLANT
NEEDLE HYPO 18GX1.5 BLUNT FILL (NEEDLE) ×2 IMPLANT
NEEDLE HYPO 25X1 1.5 SAFETY (NEEDLE) ×2 IMPLANT
NS IRRIG 1000ML POUR BTL (IV SOLUTION) ×2 IMPLANT
PACK MINOR (CUSTOM PROCEDURE TRAY) ×2 IMPLANT
PAD ABD 5X9 TENDERSORB (GAUZE/BANDAGES/DRESSINGS) ×2 IMPLANT
PAD ARMBOARD 7.5X6 YLW CONV (MISCELLANEOUS) ×2 IMPLANT
SET BASIN LINEN APH (SET/KITS/TRAYS/PACK) ×2 IMPLANT
SOL PREP PROV IODINE SCRUB 4OZ (MISCELLANEOUS) ×1 IMPLANT
SPONGE GAUZE 4X4 12PLY (GAUZE/BANDAGES/DRESSINGS) ×1 IMPLANT
SPONGE LAP 18X18 X RAY DECT (DISPOSABLE) ×2 IMPLANT
SUT PROLENE 2 0 FS (SUTURE) ×3 IMPLANT
SUT PROLENE 3 0 PS 2 (SUTURE) ×2 IMPLANT
SYR CONTROL 10ML LL (SYRINGE) ×1 IMPLANT
SYRINGE CONTROL L 12CC (SYRINGE) ×2 IMPLANT
SYRINGE CONTROL LL 12CC (SYRINGE) ×1 IMPLANT
TAPE CLOTH SURG 4X10 WHT LF (GAUZE/BANDAGES/DRESSINGS) ×1 IMPLANT

## 2014-08-08 NOTE — Interval H&P Note (Signed)
History and Physical Interval Note:  08/08/2014 8:14 AM  Eugene Diaz  has presented today for surgery, with the diagnosis of pilonidal cyst  The various methods of treatment have been discussed with the patient and family. After consideration of risks, benefits and other options for treatment, the patient has consented to  Procedure(s): CYST EXCISION PILONIDAL EXTENSIVE (N/A) as a surgical intervention .  The patient's history has been reviewed, patient examined, no change in status, stable for surgery.  I have reviewed the patient's chart and labs.  Questions were answered to the patient's satisfaction.     Aviva Signs A

## 2014-08-08 NOTE — Anesthesia Preprocedure Evaluation (Signed)
Anesthesia Evaluation  Patient identified by MRN, date of birth, ID band Patient awake    Reviewed: Allergy & Precautions, H&P , NPO status , Patient's Chart, lab work & pertinent test results  Airway Mallampati: III TM Distance: >3 FB Neck ROM: Full    Dental  (+) Poor Dentition, Missing, Chipped, Dental Advisory Given   Pulmonary shortness of breath, asthma , former smoker,  breath sounds clear to auscultation        Cardiovascular hypertension, Pt. on medications + Peripheral Vascular Disease Rhythm:Regular Rate:Normal     Neuro/Psych    GI/Hepatic GERD-  ,(+)     substance abuse (6 pack daily)  alcohol use,   Endo/Other  diabetes, Type 2, Oral Hypoglycemic Agents  Renal/GU      Musculoskeletal Chronic LBP - oxycodone   Abdominal   Peds  Hematology   Anesthesia Other Findings   Reproductive/Obstetrics                           Anesthesia Physical Anesthesia Plan  ASA: III  Anesthesia Plan: General   Post-op Pain Management:    Induction: Intravenous, Rapid sequence and Cricoid pressure planned  Airway Management Planned: Oral ETT and Video Laryngoscope Planned  Additional Equipment:   Intra-op Plan:   Post-operative Plan: Extubation in OR  Informed Consent: I have reviewed the patients History and Physical, chart, labs and discussed the procedure including the risks, benefits and alternatives for the proposed anesthesia with the patient or authorized representative who has indicated his/her understanding and acceptance.     Plan Discussed with:   Anesthesia Plan Comments:         Anesthesia Quick Evaluation

## 2014-08-08 NOTE — Anesthesia Postprocedure Evaluation (Signed)
  Anesthesia Post-op Note  Patient: Eugene Diaz  Procedure(s) Performed: Procedure(s): CYST EXCISION PILONIDAL EXTENSIVE (N/A)  Patient Location: PACU  Anesthesia Type:General  Level of Consciousness: awake and alert   Airway and Oxygen Therapy: Patient Spontanous Breathing and Patient connected to face mask oxygen  Post-op Pain: none  Post-op Assessment: Post-op Vital signs reviewed, Patient's Cardiovascular Status Stable, Respiratory Function Stable, Patent Airway and No signs of Nausea or vomiting  Post-op Vital Signs: Reviewed and stable  Last Vitals:  Filed Vitals:   08/08/14 0900  BP: 132/88  Temp:   Resp: 22    Complications: No apparent anesthesia complications

## 2014-08-08 NOTE — Anesthesia Procedure Notes (Signed)
Procedure Name: Intubation Date/Time: 08/08/2014 9:19 AM Performed by: Tressie Stalker E Pre-anesthesia Checklist: Patient identified, Patient being monitored, Timeout performed, Emergency Drugs available and Suction available Patient Re-evaluated:Patient Re-evaluated prior to inductionOxygen Delivery Method: Circle System Utilized Preoxygenation: Pre-oxygenation with 100% oxygen Intubation Type: IV induction, Rapid sequence and Cricoid Pressure applied Ventilation: Mask ventilation without difficulty Laryngoscope Size: Mac and 3 Grade View: Grade I Tube type: Oral Tube size: 8.0 mm Number of attempts: 1 Airway Equipment and Method: stylet Placement Confirmation: ETT inserted through vocal cords under direct vision,  positive ETCO2 and breath sounds checked- equal and bilateral Secured at: 23 cm Tube secured with: Tape Dental Injury: Teeth and Oropharynx as per pre-operative assessment  Comments: Glidescope available at door, grade 1 view.

## 2014-08-08 NOTE — Discharge Instructions (Signed)
Pilonidal Cyst A pilonidal cyst occurs when hairs get trapped (ingrown) beneath the skin in the crease between the buttocks over your sacrum (the bone under that crease). Pilonidal cysts are most common in young men with a lot of body hair. When the cyst is ruptured (breaks) or leaking, fluid from the cyst may cause burning and itching. If the cyst becomes infected, it causes a painful swelling filled with pus (abscess). The pus and trapped hairs need to be removed (often by lancing) so that the infection can heal. However, recurrence is common and an operation may be needed to remove the cyst. HOME CARE INSTRUCTIONS   If the cyst was NOT INFECTED:  Keep the area clean and dry. Bathe or shower daily. Wash the area well with a germ-killing soap. Warm tub baths may help prevent infection and help with drainage. Dry the area well with a towel.  Avoid tight clothing to keep area as moisture free as possible.  Keep area between buttocks as free of hair as possible. A depilatory may be used.  If the cyst WAS INFECTED and needed to be drained:  Your caregiver packed the wound with gauze to keep the wound open. This allows the wound to heal from the inside outwards and continue draining.  Return for a wound check in 1 day or as suggested.  If you take tub baths or showers, repack the wound with gauze following them. Sponge baths (at the sink) are a good alternative.  If an antibiotic was ordered to fight the infection, take as directed.  Only take over-the-counter or prescription medicines for pain, discomfort, or fever as directed by your caregiver.  After the drain is removed, use sitz baths for 20 minutes 4 times per day. Clean the wound gently with mild unscented soap, pat dry, and then apply a dry dressing. SEEK MEDICAL CARE IF:   You have increased pain, swelling, redness, drainage, or bleeding from the area.  You have a fever.  You have muscles aches, dizziness, or a general ill  feeling. Document Released: 10/14/2000 Document Revised: 01/09/2012 Document Reviewed: 12/12/2008 ExitCare Patient Information 2015 [employer]. This information is not intended to replace advice given to you by your health care provider. Make sure you discuss any questions you have with your health care provider.  

## 2014-08-08 NOTE — Op Note (Signed)
Patient:  Eugene Diaz  DOB:  Feb 28, 1966  MRN:  [id number]   Preop Diagnosis:  Pilonidal cyst  Postop Diagnosis:  Same  Procedure:  Excision of pilonidal cyst, extensive  Surgeon:  Aviva Signs, M.D.  Anes:  General endotracheal  Indications:  Patient is a 48 year old white male who presents with recurrent inflammation and tenderness the pilonidal cyst region. Normal punctate areas of the coccyx as well as indurated regions. The risks and benefits of the procedure including bleeding, infection, and recurrence of the pilonidal cysts were fully explained to the patient, who gave informed consent.  Procedure note:  Patient was placed in the right lateral decubitus position after induction of general endotracheal anesthesia. The coccyx region was prepped and draped using usual sterile technique with DuraPrep. Surgical site confirmation was performed.  An elliptical incision was made around the elongated area of involvement of the pilonidal cyst. Several punctate areas were included in the resection. The dissection was taken down to the subcutaneous tissue. The granulation tissue as well as scar tissue was noted in the subcutaneous tissue. This was excised down to the coccyx without difficulty. Any bleeding was controlled using Bovie cautery. 0.5% Sensorcaine was instilled into the surrounding wound. The incision was closed using 2-0 Prolene vertical mattress sutures. Betadine ointment and a dry sterile dressing were applied.  All tape and needle counts were correct at the end of the procedure. Patient was extubated in the operating room and transferred to PACU in stable condition.  Complications:  None  EBL:  Minimal  Specimen:  Pilonidal cyst

## 2014-08-08 NOTE — Transfer of Care (Signed)
Immediate Anesthesia Transfer of Care Note  Patient: Eugene Diaz  Procedure(s) Performed: Procedure(s): CYST EXCISION PILONIDAL EXTENSIVE (N/A)  Patient Location: PACU  Anesthesia Type:General  Level of Consciousness: awake and alert   Airway & Oxygen Therapy: Patient Spontanous Breathing and Patient connected to face mask oxygen  Post-op Assessment: Report given to PACU RN  Post vital signs: Reviewed and stable  Complications: No apparent anesthesia complications

## 2014-08-11 ENCOUNTER — Encounter (HOSPITAL_COMMUNITY): Payer: Self-pay | Admitting: General Surgery

## 2014-10-06 ENCOUNTER — Ambulatory Visit: Payer: BC Managed Care – PPO | Admitting: Neurology

## 2014-10-08 ENCOUNTER — Ambulatory Visit (INDEPENDENT_AMBULATORY_CARE_PROVIDER_SITE_OTHER): Payer: BC Managed Care – PPO | Admitting: Neurology

## 2014-10-08 ENCOUNTER — Encounter: Payer: Self-pay | Admitting: Neurology

## 2014-10-08 VITALS — BP 136/80 | HR 84 | Ht 74.0 in | Wt 276.9 lb

## 2014-10-08 DIAGNOSIS — G252 Other specified forms of tremor: Secondary | ICD-10-CM

## 2014-10-08 DIAGNOSIS — F1010 Alcohol abuse, uncomplicated: Secondary | ICD-10-CM

## 2014-10-08 DIAGNOSIS — G621 Alcoholic polyneuropathy: Secondary | ICD-10-CM | POA: Insufficient documentation

## 2014-10-08 DIAGNOSIS — E0842 Diabetes mellitus due to underlying condition with diabetic polyneuropathy: Secondary | ICD-10-CM

## 2014-10-08 DIAGNOSIS — E1140 Type 2 diabetes mellitus with diabetic neuropathy, unspecified: Secondary | ICD-10-CM | POA: Insufficient documentation

## 2014-10-08 DIAGNOSIS — G8929 Other chronic pain: Secondary | ICD-10-CM

## 2014-10-08 DIAGNOSIS — M545 Low back pain, unspecified: Secondary | ICD-10-CM

## 2014-10-08 LAB — FOLATE: Folate: 7.5 ng/mL

## 2014-10-08 LAB — TSH: TSH: 0.906 u[IU]/mL (ref 0.350–4.500)

## 2014-10-08 LAB — VITAMIN B12: Vitamin B-12: 370 pg/mL (ref 211–911)

## 2014-10-08 MED ORDER — GABAPENTIN 300 MG PO CAPS: ORAL_CAPSULE | ORAL | Status: DC

## 2014-10-08 NOTE — Progress Notes (Signed)
[facility] Clinic Note - Initial Visit   Date: 10/08/2014  Eugene Diaz MRN: [id number] DOB: 1966-09-28   Dear Collene Mares, PA:  Thank you for your kind referral of Eugene Diaz for consultation of bilateral feet pain. Although his history is well known to you, please allow [place] to reiterate it for the purpose of our medical record. The patient was accompanied to the clinic by wife who also provides collateral information.     History of Present Illness: Eugene Diaz is a 48 y.o. left-handed Caucasian male with diabetes mellitus (HbA1c 6.5), hypertension, asthma, and chronic back pain, prior tobacco use (62.5 pack year), and current tobacco use presenting for evaluation of bilateral feet pain.  Patient was not seeing a medical doctor until earlier this year when he was diagnosed with uncontrolled diabetes and hypertension.  Since he how has insurance, many of his chronic symptoms including low back pain, knee pain, and burning paresthesias of the feet are being investigated.   He reports having a bilateral feet pain, described as numbness/tingling/burning sensation for at least 10 years.  It started in his toes and over the years has progressed to involve the soles of the feet.  Currently, he also has similar tingling sensation involving the mid-calf and tips of the hands.  Light pressure, such as the ends of his socks touching his feet, makes it worse.   He denies any weakness or falls.  He also complains of bilateral hand soreness and reports to having a harder time manipulating objects.  He was started on Norco in the fall 2015 and reports no significant benefit.  He has not tried any other pain medication.  He endorses staying thirsty and drinking lots of water.  Additionally, he also admits to a long history of alcohol use which started in his early teens.  He currently drinks 3-4 beers and liquor 4-6oz nightly for at least the past 35 years.  On  weekend, he may drink up to a 12 pack of beer.    Of note, he injured his medial forearm at the age of ~78 while at work and sustained nerve and tendon injury. Since then, his last two fingers have been weak and medial hand has been numb.  Out-side paper records, electronic medical record, and images have been reviewed where available and summarized as:  Lab Results  Component Value Date   HGBA1C 6.5* 03/10/2014   Lab Results  Component Value Date   TSH 1.330 03/11/2014     Past Medical History  Diagnosis Date  . Diabetes mellitus without complication 49/5809  . Hypertension   . Hypercholesterolemia   . Peripheral vascular disease   . Shortness of breath   . Asthma     Past Surgical History  Procedure Laterality Date  . Left arm      Left arm repair (tendon and artery)  . Pilonidal cyst excision N/A 08/08/2014    Procedure: CYST EXCISION PILONIDAL EXTENSIVE;  Surgeon: Jamesetta So, MD;  Location: [facility];  Service: General;  Laterality: N/A;     Medications:  Current Outpatient Prescriptions on File Prior to Visit  Medication Sig Dispense Refill  . ADVAIR DISKUS 250-50 MCG/DOSE AEPB Inhale 1 puff into the lungs 2 (two) times daily.    Marland Kitchen albuterol (PROVENTIL HFA;VENTOLIN HFA) 108 (90 BASE) MCG/ACT inhaler Inhale 2 puffs into the lungs every 6 (six) hours as needed for wheezing or shortness of breath. 1 Inhaler 2  . ALPRAZolam (  XANAX) 1 MG tablet Take 1 mg by mouth 2 (two) times daily.    Marland Kitchen aspirin EC 81 MG tablet Take 1 tablet (81 mg total) by mouth daily.    Marland Kitchen HYDROcodone-acetaminophen (NORCO/VICODIN) 5-325 MG per tablet Take 1-2 tablets by mouth every 4 (four) hours as needed for moderate pain. 50 tablet 0  . lisinopril (PRINIVIL,ZESTRIL) 20 MG tablet Take 20 mg by mouth daily.    . sitaGLIPtin-metformin (JANUMET) 50-1000 MG per tablet Take 1 tablet by mouth daily.     No current facility-administered medications on file prior to visit.    Allergies: No Known  Allergies  Family History: Family History  Problem Relation Age of Onset  . Heart attack Mother     Living, 49  . Cancer Father     Deceased  . Healthy Brother   . Healthy Sister     Social History: History   Social History  . Marital Status: Married    Spouse Name: N/A    Number of Children: N/A  . Years of Education: N/A   Occupational History  . 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   Social History Main Topics  . Smoking status: Former Smoker -- 2.50 packs/day for 25 years    Types: Cigarettes    Quit date: 08/05/1984  . Smokeless tobacco: Not on file  . Alcohol Use: 30.0 oz/week    49 Not specified per week     Comment: Drinks 3-4 beers, liquor 4-6oz x 35 years  . Drug Use: Yes    Special: Marijuana     Comment: occasionally   . Sexual Activity: Yes   Other Topics Concern  . Not on file   Social History Narrative   He works at [employer] (horse [employer]).   Highest level of education:  8th grade   He lives with wife.  No children.       Review of Systems:  CONSTITUTIONAL: No fevers, chills, night sweats, or weight loss.   EYES: No visual changes or eye pain ENT: No hearing changes.  No history of nose bleeds.   RESPIRATORY: No cough, wheezing +shortness of breath.   CARDIOVASCULAR: Negative for chest pain, and palpitations.   GI: Negative for abdominal discomfort, blood in stools or black stools.  No recent change in bowel habits.   GU:  No history of incontinence.   MUSCLOSKELETAL: +history of joint pain or swelling.  No myalgias.   SKIN: Negative for lesions, rash, and itching.   HEMATOLOGY/ONCOLOGY: Negative for prolonged bleeding, bruising easily, and swollen nodes.  No history of cancer.   ENDOCRINE: Negative for cold or heat intolerance, + polydipsia or goiter.   PSYCH:  No depression or anxiety symptoms.   NEURO: As Above.   Vital Signs:  BP 136/80 mmHg  Pulse 84  Ht 6\' 2"  (1.88 m)  Wt 276 lb 14.4 oz  (125.601 kg)  BMI 35.54 kg/m2  SpO2 96%   General Medical Exam:   General:  Bearded man, well appearing, comfortable.   Eyes/ENT: see cranial nerve examination.   Neck: No masses appreciated.  Full range of motion without tenderness.  No carotid bruits. Respiratory:  Clear to auscultation, good air entry bilaterally.   Cardiac:  Regular rate and rhythm, no murmur.   Extremities:  No deformities, edema, or skin discoloration.  Skin:  No rashes or lesions. Distal leg hair loss.  Neurological Exam: MENTAL STATUS including orientation to time, place, person, recent and  remote memory, attention span and concentration, language, and fund of knowledge is normal.  Speech is not dysarthric.  CRANIAL NERVES: II:  No visual field defects.  Unremarkable fundi.   III-IV-VI: Pupils equal round and reactive to light.  Normal conjugate, extra-ocular eye movements in all directions of gaze.  No nystagmus.  No ptosis.   V:  Normal facial sensation.  VII:  Normal facial symmetry and movements.  No pathologic facial reflexes.  VIII:  Normal hearing and vestibular function.   IX-X:  Normal palatal movement.   XI:  Normal shoulder shrug and head rotation.   XII:  Normal tongue strength and range of motion, no deviation or fasciculation.  MOTOR:  Mild atrophy of right instrinsic hand muscles.  Bilateral postural hand tremor.  No pronator drift.  Left digits 4 and 5 with flexion deformity and hypotonia (old ulnar nerve injury).    Right Upper Extremity:    Left Upper Extremity:    Deltoid  5/5   Deltoid  5/5   Biceps  5/5   Biceps  5/5   Triceps  5/5   Triceps  5/5   Wrist extensors  5/5   Wrist extensors  5/5   Wrist flexors  5/5   Wrist flexors  5/5   Finger extensors  5/5   Finger extensors  5-/5   Finger flexors  5/5   Finger flexors  5-/5   Dorsal interossei  5/5   Dorsal interossei  3/5   Abductor pollicis  5/5   Abductor pollicis  5/5   Tone (Ashworth scale)  0  Tone (Ashworth scale)  0    Right Lower Extremity:    Left Lower Extremity:    Hip flexors  5/5   Hip flexors  5/5   Hip extensors  5/5   Hip extensors  5/5   Knee flexors  5/5   Knee flexors  5/5   Knee extensors  5/5   Knee extensors  5/5   Dorsiflexors  5/5   Dorsiflexors  5/5   Plantarflexors  5/5   Plantarflexors  5/5   Toe extensors  5/5   Toe extensors  5/5   Toe flexors  5/5   Toe flexors  5/5   Tone (Ashworth scale)  0  Tone (Ashworth scale)  0   MSRs:  Right                                                                 Left brachioradialis 1+  brachioradialis 1+  biceps 1+  biceps 1+  triceps 1+  triceps 1+  patellar 1+  patellar 2+  ankle jerk 0  ankle jerk 0  Hoffman no  Hoffman no  plantar response down  plantar response down   SENSORY:  Sensation to all modalities is reduced at mid-calf and absent in the feet.  Absent sensation to pin prick and temperature along the left ulnar distribution. Romberg's sign positive.   COORDINATION/GAIT: Normal finger-to- nose-finger and heel-to-shin.  Finger tapping is slowed on the left due to weakness and intact on the right.  Moderately wide-based, stooped posture, and slow.  He is unable to perform tandem gait.   IMPRESSION: Mr. Branden is a 48 year-old gentleman presenting for evaluation of chronic bilateral  feet painful paresthesias.  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  Diabetes and alcohol use are the biggest contributors to his symptoms and I urged the patient to maintain tight glycemic control and cut back on his alcohol intake.  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  I had extensive discussion with the patient regarding the pathogenesis, etiology, management, and natural course of neuropathy. Neuropathy tends to be slowly progressive, especially if a treatable causes are not managed.  For completeness, I would like to test for other treatable  causes of neuropathy.  EMG will be performed to better characterize the nature of his symptoms, especially because he also complains of hand dysesthesias and low back pain. He most likely also may have degenerative changes of the spine contributing to his pain, EMG will help look for radiculopathy.    PLAN/RECOMMENDATIONS:  1.  Check SPEP/UPEP with IFE, copper, ceruloplasmin zinc, vitamin B1, vitamin b6, folate, and vitamin B12 2.  EMG of right arm and leg 3.  Start gabapentin 300mg  at bedtime for one week then increase to one tablet twice daily.  Common side effects discussed 4.  Strongly recommend reduce alcohol intake  5.  Recommended to keep tight glycemic control 6.  Return to clinic in 74-months   The duration of this appointment visit was 60 minutes of face-to-face time with the patient.  Greater than 50% of this time was spent in counseling, explanation of diagnosis, planning of further management, and coordination of care.   Thank you for allowing me to participate in patient's care.  If I can answer any additional questions, I would be pleased to do so.    Sincerely,    Teia Freitas K. [name], DO

## 2014-10-08 NOTE — Patient Instructions (Addendum)
1. Strongly recommend reduce alcohol intake - you can do it! 2. Keep working with your primary care doctor to keep sugars under control 3. Check blood work today 4. Start gabapentin 300mg  at bedtime for one week then increase to one tablet twice daily.  Common side effects include lightheadedness and increased sedation. 5. EMG of the right arm and leg 6. Return to clinic in 75-months

## 2014-10-10 ENCOUNTER — Ambulatory Visit: Payer: BC Managed Care – PPO | Admitting: Neurology

## 2014-10-10 LAB — SPEP & IFE WITH QIG
Albumin ELP: 59.3 % (ref 55.8–66.1)
Alpha-1-Globulin: 3.9 % (ref 2.9–4.9)
Alpha-2-Globulin: 9.4 % (ref 7.1–11.8)
Beta 2: 4.8 % (ref 3.2–6.5)
Beta Globulin: 6.4 % (ref 4.7–7.2)
Gamma Globulin: 16.2 % (ref 11.1–18.8)
IgA: 285 mg/dL (ref 68–379)
IgG (Immunoglobin G), Serum: 1120 mg/dL (ref 650–1600)
IgM, Serum: 224 mg/dL (ref 41–251)
Total Protein, Serum Electrophoresis: 7.7 g/dL (ref 6.0–8.3)

## 2014-10-10 LAB — UIFE/LIGHT CHAINS/TP QN, 24-HR UR
Albumin, U: DETECTED
Alpha 1, Urine: DETECTED — AB
Alpha 2, Urine: DETECTED — AB
Beta, Urine: DETECTED — AB
Gamma Globulin, Urine: DETECTED — AB
Total Protein, Urine: 11 mg/dL (ref 5–25)

## 2014-10-10 LAB — ZINC: Zinc: 81 ug/dL (ref 60–130)

## 2014-10-10 LAB — COPPER, SERUM: Copper: 78 ug/dL (ref 70–175)

## 2014-10-12 LAB — VITAMIN B6: Vitamin B6: 9.1 ng/mL (ref 2.1–21.7)

## 2014-10-13 LAB — CERULOPLASMIN: Ceruloplasmin: 22 mg/dL (ref 18–36)

## 2014-10-14 LAB — VITAMIN B1: Vitamin B1 (Thiamine): 8 nmol/L (ref 8–30)

## 2014-11-13 ENCOUNTER — Telehealth: Payer: Self-pay | Admitting: Neurology

## 2014-11-13 ENCOUNTER — Ambulatory Visit (INDEPENDENT_AMBULATORY_CARE_PROVIDER_SITE_OTHER): Payer: 59 | Admitting: Neurology

## 2014-11-13 DIAGNOSIS — G621 Alcoholic polyneuropathy: Secondary | ICD-10-CM

## 2014-11-13 DIAGNOSIS — E0842 Diabetes mellitus due to underlying condition with diabetic polyneuropathy: Secondary | ICD-10-CM

## 2014-11-13 DIAGNOSIS — F1010 Alcohol abuse, uncomplicated: Secondary | ICD-10-CM

## 2014-11-13 NOTE — Procedures (Signed)
[facility]  [address], [place]  [place], [place] [address] Tel: [phone] Fax:  [phone] Test Date:  11/13/2014  Patient: Eugene Diaz DOB: [birth year] Physician: Narda Amber, DO  Sex: Male Height: 6\' 2"  Ref Phys: Narda Amber  ID#: [id number] Temp: 32.0C Technician: Laureen Ochs R. NCS T.   Patient Complaints: Patient is a 49 year old male here for evaluation of generalized paresthesias of the hands and feet.    NCV & EMG Findings: Extensive electrodiagnostic testing of the right upper and lower extremity shows:  1. All sensory responses are reduced in the arms and legs including the median, ulnar, radial, sural, and superficial peroneal sensory responses. Additionally, the median and ulnar sensory responses are prolonged. 2. Right median motor response shows prolonged distal latency. Right ulnar motor response shows conduction velocity slowing across the elbow. 3. Right tibial motor response is reduced in amplitude. The peroneal motor responses within normal limits. 4. Late responses are prolonged. 5. In the arms, chronic motor axon loss changes are seen isolated to the abductor pollicis brevis muscle. 6. In the legs, chronic motor axon loss changes are seen affecting L2-3-L4 and S1 myotomes.  Impression: 1. 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 A superimposed right median neuropathy at the wrist and ulnar neuropathy at the elbow is likely. 2. Chronic L3-L4 radiculopathy affecting the right lower extremity, mild in degree electrically. 3. Chronic S1 radiculopathy affecting the right lower extremity, mild in degree electrically.   ___________________________ Narda Amber, DO    Nerve Conduction Studies Anti Sensory Summary Table   Site NR Peak (ms) Norm Peak (ms) P-T Amp (V) Norm P-T Amp  Right Median Anti  Sensory (2nd Digit)  34C    stimulated at 14cm  Wrist    4.4 <3.4 10.0 >20  Right Radial Anti Sensory (Base 1st Digit)  34C  Wrist    2.4 <2.7 11.1 >18  Right Sup Peroneal Anti Sensory (Ant Lat Mall)  34C  12 cm    3.4 <4.5 4.1 >5  Right Sural Anti Sensory (Lat Mall)  31C  Calf    4.4 <4.5 4.6 >5  Right Ulnar Anti Sensory (5th Digit)  34C  Wrist    3.3 <3.1 8.6 >12   Motor Summary Table   Site NR Onset (ms) Norm Onset (ms) O-P Amp (mV) Norm O-P Amp Site1 Site2 Delta-0 (ms) Dist (cm) Vel (m/s) Norm Vel (m/s)  Right Median Motor (Abd Poll Brev)  34C  Wrist    4.3 <3.9 7.4 >6 Elbow Wrist 5.9 32.0 54 >50  Elbow    10.2  6.8         Right Peroneal Motor (Ext Dig Brev)  31C  Ankle    3.9 <5.5 6.0 >3 B Fib Ankle 9.5 39.0 41 >40  B Fib    13.4  5.3  Poplt B Fib 2.0 10.0 50 >40  Poplt    15.4  5.6         Right Peroneal TA Motor (Tib Ant)  31C  Fib Head    2.6 <4.0 8.4 >4 Poplit Fib Head 2.3 12.0 52 >40  Poplit    4.9  8.1         Right Radial Motor (Ext Ind Prop)  34C  7cm    1.9 <3.1 7.7 >6 Up Arm 7cm 5.2 27.0 52 >50  Up Arm  7.1  7.7  Axilla Up Arm 0.3 2.0 67   Axilla    7.4  7.7         Right Tibial Motor (Abd Hall Brev)  31C  Ankle    4.1 <6.0 5.2 >8 Knee Ankle 12.3 44.5 36 >40  Knee    16.4  3.6         Right Ulnar Motor (Abd Dig Minimi)  34C  Wrist    3.0 <3.1 8.1 >7 B Elbow Wrist 5.3 27.0 51 >50  B Elbow    8.3  7.3  A Elbow B Elbow 2.4 10.0 42 >50  A Elbow    10.7  7.2             13.8  0.0          F Wave Studies   NR F-Lat (ms) Lat Norm (ms) L-R F-Lat (ms)  Right Tibial (Mrkrs) (Abd Hallucis)  31C    6'2"     63.96 <55   Right Ulnar (Mrkrs) (Abd Dig Min)  34C     34.68 <33    H Reflex Studies   NR H-Lat (ms) Lat Norm (ms) L-R H-Lat (ms)  Left Tibial (Gastroc)  31C     42.99 <35 2.45  Right Tibial (Gastroc)  31C     40.54 <35 2.45   EMG   Side Muscle Ins Act Fibs Psw Fasc Number Recrt Dur Dur. Amp Amp. Poly Poly. Comment  Right AntTibialis Nml  Nml Nml Nml Nml Nml Nml Nml Nml Nml Nml Nml N/A  Right Gastroc Nml Nml Nml Nml 1- Mod-R Few 1+ Nml Nml Nml Nml N/A  Right Flex Dig Long Nml Nml Nml Nml 1- Mod-R Few 1+ Nml Nml Nml Nml N/A  Right RectFemoris Nml Nml Nml Nml 1- Mod-R Some 1+ Some 1+ Nml Nml N/A  Right GluteusMed Nml Nml Nml Nml Nml Nml Nml Nml Nml Nml Nml Nml N/A  Right AdductorLong Nml Nml Nml Nml 1- Mod-R Some 1+ Some 1+ Nml Nml N/A  Right BicepsFemS Nml Nml Nml Nml 1- Mod-R Few 1+ Nml Nml Nml Nml N/A  Right 1stDorInt Nml Nml Nml Nml Nml Nml Nml Nml Nml Nml Nml Nml N/A  Right Abd Poll Brev Nml Nml Nml Nml Nml Mod-R Some 1+ Some 1+ Nml Nml N/A  Right FlexPolLong Nml Nml Nml Nml Nml Nml Nml Nml Nml Nml Nml Nml N/A  Right Ext Indicis Nml Nml Nml Nml Nml Nml Nml Nml Nml Nml Nml Nml N/A  Right PronatorTeres Nml Nml Nml Nml Nml Nml Nml Nml Nml Nml Nml Nml N/A  Right Biceps Nml Nml Nml Nml Nml Nml Nml Nml Nml Nml Nml Nml N/A  Right Triceps Nml Nml Nml Nml Nml Nml Nml Nml Nml Nml Nml Nml N/A  Right Deltoid Nml Nml Nml Nml Nml Nml Nml Nml Nml Nml Nml Nml N/A  Right Cervical Parasp Low Nml Nml Nml Nml Nml Nml Nml Nml Nml Nml Nml Nml N/A      Waveforms:

## 2014-11-13 NOTE — Telephone Encounter (Signed)
Pt resch appt from 12-15-14 to 12-01-14

## 2014-12-01 ENCOUNTER — Ambulatory Visit (INDEPENDENT_AMBULATORY_CARE_PROVIDER_SITE_OTHER): Payer: 59 | Admitting: Neurology

## 2014-12-01 ENCOUNTER — Encounter: Payer: Self-pay | Admitting: Neurology

## 2014-12-01 VITALS — BP 148/90 | HR 98 | Ht 74.0 in | Wt 275.6 lb

## 2014-12-01 DIAGNOSIS — G621 Alcoholic polyneuropathy: Secondary | ICD-10-CM | POA: Diagnosis not present

## 2014-12-01 DIAGNOSIS — G8929 Other chronic pain: Secondary | ICD-10-CM

## 2014-12-01 DIAGNOSIS — M545 Low back pain, unspecified: Secondary | ICD-10-CM

## 2014-12-01 DIAGNOSIS — F1010 Alcohol abuse, uncomplicated: Secondary | ICD-10-CM

## 2014-12-01 DIAGNOSIS — E0842 Diabetes mellitus due to underlying condition with diabetic polyneuropathy: Secondary | ICD-10-CM | POA: Diagnosis not present

## 2014-12-01 DIAGNOSIS — G252 Other specified forms of tremor: Secondary | ICD-10-CM

## 2014-12-01 MED ORDER — GABAPENTIN 300 MG PO CAPS: ORAL_CAPSULE | ORAL | Status: DC

## 2014-12-01 MED ORDER — LIDOCAINE 5 % EX OINT: 1.0000 "application " | TOPICAL_OINTMENT | Freq: Three times a day (TID) | CUTANEOUS | Status: DC | PRN

## 2014-12-01 MED ORDER — PRIMIDONE 50 MG PO TABS: 25.0000 mg | ORAL_TABLET | Freq: Two times a day (BID) | ORAL | Status: DC

## 2014-12-01 NOTE — Progress Notes (Signed)
Follow-up Visit   Date: 12/01/2014   Eugene Diaz MRN: [id number] DOB: [birth year]   Interim History: Eugene Diaz is a 49 y.o. left-handed Caucasian male with  diabetes mellitus (HbA1c 6.5), hypertension, asthma, and chronic back pain, and prior tobacco use (62.5 pack year) returning to the clinic for follow-up of painful alcoholic neuropathy.  The patient was accompanied to the clinic by wife who also provides collateral information.     History of present illness: Patient was not seeing a medical doctor until earlier in 2015 when he was diagnosed with uncontrolled diabetes and hypertension.  Since he has insurance, many of his chronic symptoms including low back pain, knee pain, and burning paresthesias of the feet are being investigated.   He reports having a bilateral feet pain, described as numbness/tingling/burning sensation for at least 10 years.  It started in his toes and over the years has progressed to involve the soles of the feet.  Currently, he also has similar tingling sensation involving the mid-calf and tips of the hands.  Light pressure, such as the ends of his socks touching his feet, makes it worse.   He denies any weakness or falls.  He also complains of bilateral hand soreness and reports to having a harder time manipulating objects.  He was started on Norco in the fall 2015 and reports no significant benefit.  He has not tried any other pain medication.  He endorses staying thirsty and drinking lots of water.  Additionally, he also admits to a long history of alcohol use which started in his early teens.  He currently drinks 3-4 beers and liquor 4-6oz nightly for at least the past 35 years.  On weekend, he may drink up to a 12 pack of beer.    Of note, he injured his medial forearm at the age of ~22 while at work and sustained nerve and tendon injury. Since then, his last two fingers have been weak and medial hand has been numb.  UPDATE 12/01/2014: He is stopped  liquor and red apple ale, but continues to drink about 6-pack a night. He has a lot of generalized pain of his joints, low back, and neuropathic pain of his hands and feet.  He takes oxycodone twice a daily but does not have any improvement to his pain.  He is currently taking gabapentin 600mg  BID without any benefit or side effect.  Here is here to discuss lab and EMG results (see below).  Medications:  Current Outpatient Prescriptions on File Prior to Visit  Medication Sig Dispense Refill  . ADVAIR DISKUS 250-50 MCG/DOSE AEPB Inhale 1 puff into the lungs 2 (two) times daily.    Marland Kitchen albuterol (PROVENTIL HFA;VENTOLIN HFA) 108 (90 BASE) MCG/ACT inhaler Inhale 2 puffs into the lungs every 6 (six) hours as needed for wheezing or shortness of breath. 1 Inhaler 2  . ALPRAZolam (XANAX) 1 MG tablet Take 1 mg by mouth 2 (two) times daily.    Marland Kitchen aspirin EC 81 MG tablet Take 1 tablet (81 mg total) by mouth daily.    Marland Kitchen HYDROcodone-acetaminophen (NORCO/VICODIN) 5-325 MG per tablet Take 1-2 tablets by mouth every 4 (four) hours as needed for moderate pain. 50 tablet 0  . lisinopril (PRINIVIL,ZESTRIL) 20 MG tablet Take 20 mg by mouth daily.    . sitaGLIPtin-metformin (JANUMET) 50-1000 MG per tablet Take 1 tablet by mouth daily.     No current facility-administered medications on file prior to visit.    Allergies: No  Known Allergies  Review of Systems:  CONSTITUTIONAL: No fevers, chills, night sweats, or weight loss.  EYES: No visual changes or eye pain ENT: No hearing changes.  No history of nose bleeds.   RESPIRATORY: No cough, wheezing and shortness of breath.   CARDIOVASCULAR: Negative for chest pain, and palpitations.   GI: Negative for abdominal discomfort, blood in stools or black stools.  No recent change in bowel habits.   GU:  No history of incontinence.   MUSCLOSKELETAL: +history of joint pain or swelling.  No myalgias.   SKIN: Negative for lesions, rash, and itching.   ENDOCRINE: Negative for  cold or heat intolerance, polydipsia or goiter.   PSYCH:  + depression or anxiety symptoms.   NEURO: As Above.   Vital Signs:  BP 148/90 mmHg  Pulse 98  Ht 6\' 2"  (1.88 m)  Wt 275 lb 9.6 oz (125.011 kg)  BMI 35.37 kg/m2  SpO2 99%  Neurological Exam: MENTAL STATUS including orientation to time, place, person, recent and remote memory, attention span and concentration, language, and fund of knowledge is normal.  Speech is not dysarthric.  CRANIAL NERVES:  Pupils equal round and reactive to light.  Face is symmetric.  MOTOR:  Mild atrophy of right instrinsic hand muscles. Bilateral postural hand tremor. No pronator drift. Left digits 4 and 5 with flexion deformity and hypotonia (old ulnar nerve injury). Motor strength 5/5 throughout except 3/5 left dorsal interosseus muscles and 5-/5 finger flexors.  MSRs:  Reflexes are 2+/4 throughout, except absent at the Achilles bilaterally.  SENSORY: Sensation to all modalities is reduced at mid-calf and absent in the feet. Absent sensation to pin prick and temperature along the left ulnar distribution  COORDINATION/GAIT:  Moderately wide-based, stooped posture, and slow   Data: Lab 10/07/2014:  Vitamin B1 8, vitamin B6 9.1, folate 7.5, copper 78, ceruloplasmin, zinc 81, vitamin B12 370, TSH 0.906, SPEP/UPEP with IFE no M protein  EMG 11/13/2014 of the right upper and lower extremity: 1.  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 A superimposed right median neuropathy at the wrist and ulnar neuropathy at the elbow is likely. 2. Chronic L3-L4 radiculopathy affecting the right lower extremity, mild in degree electrically. 3. Chronic S1 radiculopathy affecting the right lower extremity, mild in degree electrically.   IMPRESSION/PLAN: 1.  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  Alcohol induced tremor  - Strongly used him to continue to wean his drinking  - Given that it is inferefering with his fine motor skills, will offer a trial of primidone 25mg  BID.    - Avoid betablocks given history of COPD and asthma  3.  Chronic low back pain  - Consider pain management referral going forward  4.  Return to clinic in 2-3 months    The duration of this appointment visit was 30 minutes of face-to-face time with the patient.  Greater than 50% of this time was spent in counseling, explanation of diagnosis, planning of further management, and coordination of care.   Thank you for allowing me to participate in patient's care.  If I can answer any additional questions, I  would be pleased to do so.    Sincerely,    Orlandria Kissner K. [name], DO

## 2014-12-01 NOTE — Patient Instructions (Addendum)
1.  Increase gabapentin 300mg  as follows:   AM  Afternoon  PM      Week 1 600mg   300mg    600mg       Week 2 600mg   600mg    600mg   (2 tablets)      Week 3 600mg   600mg    900mg  (3 tablets)      Week 4 900mg   600mg    900mg        Week 5 900mg   900mg    900mg   2.  Start lidocaine ointment - apply to feet twice daily.  Use gloves  3.  For your tremors, start primidone 25mg  twice daily  4.  Call with update in 5-6 weeks  5.  Return to clinic in 2-3 months

## 2014-12-15 ENCOUNTER — Ambulatory Visit: Payer: BC Managed Care – PPO | Admitting: Neurology

## 2014-12-16 NOTE — Progress Notes (Signed)
Note faxed.

## 2014-12-25 ENCOUNTER — Telehealth: Payer: Self-pay | Admitting: *Deleted

## 2014-12-25 MED ORDER — NORTRIPTYLINE HCL 25 MG PO CAPS: 25.0000 mg | ORAL_CAPSULE | Freq: Every day | ORAL | Status: DC

## 2014-12-25 NOTE — Telephone Encounter (Signed)
Let's start nortriptyline 25mg  qhs - please notify patient I have sent Rx.  He should continue gabapentin.  Kaley Jutras K. [name], DO

## 2014-12-25 NOTE — Telephone Encounter (Signed)
Patient is taking Gabapentin 300 mg 4 pills 3 x a day.   He says that he is only supposed to take 3 pills 3 x day but increased it.  He says that his feet are still hurting and it is moving up into his legs.  Please advise.

## 2014-12-25 NOTE — Telephone Encounter (Signed)
Called patient back and gave him instructions per Dr. [name].

## 2014-12-25 NOTE — Telephone Encounter (Signed)
He was already supposed to be taking 900 mg tid but has increased it to 1200 mg tid.

## 2014-12-25 NOTE — Telephone Encounter (Signed)
Patient calling with questions about his medication please advise Call back number [phone]

## 2014-12-25 NOTE — Telephone Encounter (Signed)
If he is not having side effects such as increased sleepiness, he can increase to 2 tablets (600mg ) 3 times a day.  Please ask him to call us again next week with an update and if he needs a new prescription.  Jaide Hillenburg K. [name], DO

## 2014-12-26 NOTE — Telephone Encounter (Signed)
Encounter still open, if completed, please close / [facility]

## 2015-01-02 ENCOUNTER — Telehealth: Payer: Self-pay | Admitting: Neurology

## 2015-01-02 MED ORDER — CYCLOBENZAPRINE HCL 5 MG PO TABS: 5.0000 mg | ORAL_TABLET | Freq: Two times a day (BID) | ORAL | Status: DC | PRN

## 2015-01-02 NOTE — Telephone Encounter (Signed)
He is on 300 mg tid of gabapentin.  He has been taking 4 pills tid.  His back is still hurting.  He is still using lidocaine on his feet.  What can he do for the back pain?  Please advise.

## 2015-01-02 NOTE — Telephone Encounter (Signed)
Pt called wanting to speak with a nurse regarding his GABAPENTIN. C/B [phone]

## 2015-01-02 NOTE — Telephone Encounter (Signed)
Patient notified of new Rx and was given instructions.

## 2015-01-02 NOTE — Telephone Encounter (Signed)
I have sent Rx for flexeril - tell him to take it twice daily for muscle spasm and low back pain.  It may make him sleepy, so please ask him to start taking it at bedtime and be cautious before he drives.  Marquis Diles K. [name], DO

## 2015-02-06 ENCOUNTER — Ambulatory Visit (INDEPENDENT_AMBULATORY_CARE_PROVIDER_SITE_OTHER): Payer: 59 | Admitting: Neurology

## 2015-02-06 ENCOUNTER — Encounter: Payer: Self-pay | Admitting: Neurology

## 2015-02-06 ENCOUNTER — Other Ambulatory Visit: Payer: Self-pay | Admitting: Neurology

## 2015-02-06 VITALS — BP 120/74 | HR 78 | Ht 74.0 in | Wt 291.5 lb

## 2015-02-06 DIAGNOSIS — G621 Alcoholic polyneuropathy: Secondary | ICD-10-CM

## 2015-02-06 DIAGNOSIS — M545 Low back pain, unspecified: Secondary | ICD-10-CM

## 2015-02-06 DIAGNOSIS — M25569 Pain in unspecified knee: Secondary | ICD-10-CM

## 2015-02-06 DIAGNOSIS — G8929 Other chronic pain: Secondary | ICD-10-CM

## 2015-02-06 DIAGNOSIS — E0842 Diabetes mellitus due to underlying condition with diabetic polyneuropathy: Secondary | ICD-10-CM

## 2015-02-06 DIAGNOSIS — G894 Chronic pain syndrome: Secondary | ICD-10-CM

## 2015-02-06 MED ORDER — NORTRIPTYLINE HCL 50 MG PO CAPS: 50.0000 mg | ORAL_CAPSULE | Freq: Every day | ORAL | Status: DC

## 2015-02-06 NOTE — Progress Notes (Signed)
Note sent

## 2015-02-06 NOTE — Progress Notes (Signed)
Follow-up Visit   Date: 02/06/2015   Eugene Diaz MRN: [id number] DOB: 1966/06/05   Interim History: Eugene Diaz is a 49 y.o. left-handed Caucasian male with  diabetes mellitus (HbA1c 6.5), hypertension, asthma, and chronic back pain, and prior tobacco use (62.5 pack year) returning to the clinic for follow-up of painful alcoholic neuropathy.  The patient was accompanied to the clinic by wife who also provides collateral information.     History of present illness: Patient was not seeing a medical doctor until earlier in 2015 when he was diagnosed with uncontrolled diabetes and hypertension.  Since he has insurance, many of his chronic symptoms including low back pain, knee pain, and burning paresthesias of the feet are being investigated.   He reports having a bilateral feet pain, described as numbness/tingling/burning sensation for at least 10 years.  It started in his toes and over the years has progressed to involve the soles of the feet.  Currently, he also has similar tingling sensation involving the mid-calf and tips of the hands.  Light pressure, such as the ends of his socks touching his feet, makes it worse.   He denies any weakness or falls.  He also complains of bilateral hand soreness and reports to having a harder time manipulating objects.  He was started on Norco in the fall 2015 and reports no significant benefit.  He has not tried any other pain medication.  He endorses staying thirsty and drinking lots of water.  Additionally, he also admits to a long history of alcohol use which started in his early teens.  He currently drinks 3-4 beers and liquor 4-6oz nightly for at least the past 35 years.  On weekend, he may drink up to a 12 pack of beer.    Of note, he injured his medial forearm at the age of ~71 while at work and sustained nerve and tendon injury. Since then, his last two fingers have been weak and medial hand has been numb.  UPDATE 12/01/2014: He is stopped  liquor and red apple ale, but continues to drink about 6-pack a night. He has a lot of generalized pain of his joints, low back, and neuropathic pain of his hands and feet.  He takes oxycodone twice a daily but does not have any improvement to his pain.  He is currently taking gabapentin 600mg  BID without any benefit or side effect.  Here is here to discuss lab and EMG results (see below).  UPDATE 02/06/2015:  Tremors are significantly improved after starting primidone 25mg  bid.  Pain is severe and involves his back, legs, feet, and knees.  Despite increasing his neurontin and adding nortriptyline, there is no change.  He had to cancel his appointment with ortho for his knee pain due to insurance reasons.  His pain is out of proportion than would be expected for neuropathy and may be that he as overlapping arthralgias as well as chronic pain.   Medications:  Current Outpatient Prescriptions on File Prior to Visit  Medication Sig Dispense Refill  . ADVAIR DISKUS 250-50 MCG/DOSE AEPB Inhale 1 puff into the lungs 2 (two) times daily.    Marland Kitchen albuterol (PROVENTIL HFA;VENTOLIN HFA) 108 (90 BASE) MCG/ACT inhaler Inhale 2 puffs into the lungs every 6 (six) hours as needed for wheezing or shortness of breath. 1 Inhaler 2  . ALPRAZolam (XANAX) 1 MG tablet Take 1 mg by mouth 2 (two) times daily.    Marland Kitchen aspirin EC 81 MG tablet Take 1  tablet (81 mg total) by mouth daily.    . cyclobenzaprine (FLEXERIL) 5 MG tablet Take 1 tablet (5 mg total) by mouth 2 (two) times daily as needed for muscle spasms (and low back pain). 30 tablet 1  . gabapentin (NEURONTIN) 300 MG capsule Take 3 tablets three times daily 180 capsule 5  . lidocaine (XYLOCAINE) 5 % ointment Apply 1 application topically 3 (three) times daily as needed. Use gloves when applying to feet. 50 g 3  . lisinopril (PRINIVIL,ZESTRIL) 20 MG tablet Take 20 mg by mouth daily.    . nortriptyline (PAMELOR) 25 MG capsule Take 1 capsule (25 mg total) by mouth at bedtime. 30  capsule 5  . primidone (MYSOLINE) 50 MG tablet Take 0.5 tablets (25 mg total) by mouth 2 (two) times daily. 30 tablet 3   No current facility-administered medications on file prior to visit.    Allergies: No Known Allergies  Review of Systems:  CONSTITUTIONAL: No fevers, chills, night sweats, or weight loss.  EYES: No visual changes or eye pain ENT: No hearing changes.  No history of nose bleeds.   RESPIRATORY: No cough, wheezing and shortness of breath.   CARDIOVASCULAR: Negative for chest pain, and palpitations.   GI: Negative for abdominal discomfort, blood in stools or black stools.  No recent change in bowel habits.   GU:  No history of incontinence.   MUSCLOSKELETAL: +history of joint pain or swelling.  No myalgias.   SKIN: Negative for lesions, rash, and itching.   ENDOCRINE: Negative for cold or heat intolerance, polydipsia or goiter.   PSYCH:  + depression or anxiety symptoms.   NEURO: As Above.   Vital Signs:  BP 120/74 mmHg  Pulse 78  Ht 6\' 2"  (1.88 m)  Wt 291 lb 8 oz (132.224 kg)  BMI 37.41 kg/m2  SpO2 95%  Neurological Exam: MENTAL STATUS including orientation to time, place, person, recent and remote memory, attention span and concentration, language, and fund of knowledge is normal.  Speech is not dysarthric.  CRANIAL NERVES:  Pupils equal round and reactive to light.  Face is symmetric.  MOTOR:  Mild atrophy of right instrinsic hand muscles. No tremor (improved!). Left digits 4 and 5 with flexion deformity and hypotonia (old ulnar nerve injury). Motor strength 5/5 throughout except 3/5 left dorsal interosseus muscles and 5-/5 finger flexors.  MSRs:  Reflexes are 2+/4 throughout, except absent at the Achilles bilaterally.  SENSORY: Sensation to all modalities is reduced at mid-calf and absent in the feet. Absent sensation to pin prick and temperature along the left ulnar distribution  COORDINATION/GAIT:  Wearing abdominal brace and bilateral knee braces,  walking is stopped with knees bent - appears antalgic  Data: Lab 10/07/2014:  Vitamin B1 8, vitamin B6 9.1, folate 7.5, copper 78, ceruloplasmin, zinc 81, vitamin B12 370, TSH 0.906, SPEP/UPEP with IFE no M protein  EMG 11/13/2014 of the right upper and lower extremity: 1.  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 A superimposed right median neuropathy at the wrist and ulnar neuropathy at the elbow is likely. 2. Chronic L3-L4 radiculopathy affecting the right lower extremity, mild in degree electrically. 3. Chronic S1 radiculopathy affecting the right lower extremity, mild in degree electrically.   IMPRESSION/PLAN: Chronic pain syndrome with low back pain, painful neuropathy (diabetic and alcoholic), and severe arthralgias impairing his gait.   I have been trying to optimize his medications  to treat his neuropathy, but unfortunately cannot make any improvement so the next step is Pain Management.  I will increase his nortriptyline to 50mg  qhs and continue gabapentin 900mg  TID.  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  - Strongly used him to continue to wean his drinking  - Continue primidone 25mg  twice daily  Chronic low back pain  - MRI lumbar spine   - Continue flexeril  - Consider pain management referral going forward  Bilateral knee pain and buckling sensation  - Motor strength is intact, he may have underlying arthritis so will ask for orthopeadics to evaluate  Return to clinic in 4 months    The duration of this appointment visit was 30 minutes of face-to-face time with the patient.   Greater than 50% of this time was spent in counseling, explanation of diagnosis, planning of further management, and coordination of care.   Thank you for allowing me to participate in patient's care.  If I can answer any additional questions, I would be pleased to do so.    Sincerely,    Safiatou Islam K. [name], DO

## 2015-02-06 NOTE — Patient Instructions (Addendum)
1.  Increase nortriptyline to 50mg  at bedtime 2.  MRI lumbar spine wo contrast 3.  Referral to pain management and orthopeadics (knee pain) 4.  Return to clinic in 20-months

## 2015-02-09 ENCOUNTER — Telehealth: Payer: Self-pay | Admitting: Neurology

## 2015-02-09 ENCOUNTER — Other Ambulatory Visit: Payer: Self-pay | Admitting: *Deleted

## 2015-02-09 MED ORDER — LIDOCAINE 5 % EX OINT: 1.0000 "application " | TOPICAL_OINTMENT | Freq: Three times a day (TID) | CUTANEOUS | Status: DC | PRN

## 2015-02-09 NOTE — Telephone Encounter (Signed)
See previous documentation, can encounter be closed?  / Sherri S.

## 2015-02-09 NOTE — Telephone Encounter (Signed)
Rx sent 

## 2015-02-09 NOTE — Telephone Encounter (Signed)
Pt wants to know refill the medication that he rubs on his feet. He was not sure of the medication name. Would like it called into the [facility] in [place] pt phone number is [phone]

## 2015-02-16 ENCOUNTER — Telehealth: Payer: Self-pay | Admitting: *Deleted

## 2015-02-16 NOTE — Telephone Encounter (Signed)
 ortho called in reference to the referral placed for this patient please call Candy at [phone] ext [phone]

## 2015-02-19 ENCOUNTER — Inpatient Hospital Stay: Admission: RE | Admit: 2015-02-19 | Payer: 59 | Source: Ambulatory Visit

## 2015-03-13 ENCOUNTER — Telehealth: Payer: Self-pay | Admitting: *Deleted

## 2015-03-13 NOTE — Telephone Encounter (Signed)
The pharmacy faxed a prescription in for patient's Primidone.  I called them and gave ok to refill #30 with 3 refills.

## 2015-05-05 ENCOUNTER — Other Ambulatory Visit: Payer: Self-pay | Admitting: *Deleted

## 2015-05-05 MED ORDER — LIDOCAINE 5 % EX OINT: 1.0000 "application " | TOPICAL_OINTMENT | Freq: Three times a day (TID) | CUTANEOUS | Status: DC | PRN

## 2015-05-14 ENCOUNTER — Other Ambulatory Visit: Payer: Self-pay | Admitting: *Deleted

## 2015-06-08 ENCOUNTER — Ambulatory Visit (INDEPENDENT_AMBULATORY_CARE_PROVIDER_SITE_OTHER): Payer: 59 | Admitting: Neurology

## 2015-06-08 ENCOUNTER — Encounter: Payer: Self-pay | Admitting: Neurology

## 2015-06-08 VITALS — BP 128/80 | HR 85 | Ht 74.0 in | Wt 287.4 lb

## 2015-06-08 DIAGNOSIS — G894 Chronic pain syndrome: Secondary | ICD-10-CM

## 2015-06-08 DIAGNOSIS — G621 Alcoholic polyneuropathy: Secondary | ICD-10-CM

## 2015-06-08 DIAGNOSIS — G252 Other specified forms of tremor: Secondary | ICD-10-CM

## 2015-06-08 DIAGNOSIS — E0842 Diabetes mellitus due to underlying condition with diabetic polyneuropathy: Secondary | ICD-10-CM

## 2015-06-08 MED ORDER — NORTRIPTYLINE HCL 75 MG PO CAPS: 75.0000 mg | ORAL_CAPSULE | Freq: Every day | ORAL | Status: DC

## 2015-06-08 MED ORDER — PRIMIDONE 50 MG PO TABS: 50.0000 mg | ORAL_TABLET | Freq: Two times a day (BID) | ORAL | Status: DC

## 2015-06-08 MED ORDER — GABAPENTIN 300 MG PO CAPS: 300.0000 mg | ORAL_CAPSULE | Freq: Three times a day (TID) | ORAL | Status: DC

## 2015-06-08 MED ORDER — CYCLOBENZAPRINE HCL 5 MG PO TABS: 5.0000 mg | ORAL_TABLET | Freq: Two times a day (BID) | ORAL | Status: DC | PRN

## 2015-06-08 NOTE — Patient Instructions (Addendum)
1.  MRI lumbar spine wo contrast 2.  Pain management referral 3.  Increase primidone 50mg  twice daily for tremors 4.  Increase nortriptyline to 75mg  daily for pain 5.  Continue gabapentin 900mg  three times daily and flexeril 5mg  twice daily 6.  Encouraged to use a walker  7.  Follow-up with your primary care provider for assistance with financial resources 8.  Return to clinic in 4 months

## 2015-06-08 NOTE — Progress Notes (Signed)
Follow-up Visit   Date: 06/08/2015   JOAS MOTTON MRN: [id number] DOB: 12-23-1965   Interim History: Eugene Diaz is a 49 y.o. left-handed Caucasian male with  diabetes mellitus (HbA1c 6.5), hypertension, asthma, and chronic back pain, and prior tobacco use (62.5 pack year) returning to the clinic for follow-up of painful alcoholic neuropathy.  The patient was accompanied to the clinic by wife who also provides collateral information.     History of present illness: Patient was not seeing a medical doctor until earlier in 2015 when he was diagnosed with uncontrolled diabetes and hypertension. His also has chronic symptoms including low back pain, knee pain, and burning paresthesias of the feet.  He reports having a bilateral feet pain, described as numbness/tingling/burning sensation for at least 10 years.  It started in his toes and over the years has progressed to involve the soles of the feet.  Currently, he also has similar tingling sensation involving the mid-calf and tips of the hands.  Light pressure, such as the ends of his socks touching his feet, makes it worse.   He also complains of bilateral hand soreness and reports to having a harder time manipulating objects.  He was started on Norco in the fall 2015 and reports no significant benefit.    He endorses staying thirsty and drinking lots of water.  Additionally, he also admits to a long history of alcohol use which started in his early teens.  He currently drinks 3-4 beers and liquor 4-6oz nightly for at least the past 35 years.  On weekend, he may drink up to a 12 pack of beer.    Of note, he injured his medial forearm at the age of ~27 while at work and sustained nerve and tendon injury. Since then, his last two fingers have been weak and medial hand has been numb.  UPDATE 12/01/2014: He is stopped liquor and red apple ale, but continues to drink about 6-pack a night. He has a lot of generalized pain of his joints, low  back, and neuropathic pain of his hands and feet.  He takes oxycodone twice a daily but does not have any improvement to his pain.  He is currently taking gabapentin 600mg  BID without any benefit or side effect.    UPDATE 02/06/2015:  Tremors are significantly improved after starting primidone 25mg  bid.  Pain is severe and involves his back, legs, feet, and knees.  Despite increasing his neurontin and adding nortriptyline, there is no change.  He had to cancel his appointment with ortho for his knee pain due to insurance reasons.  His pain is out of proportion than would be expected for neuropathy and may be that he as overlapping arthralgias as well as chronic pain.   UPDATE 06/08/2015:  Symptoms are no better and feels as if his pain is worse.  He has generalized pain of his legs, knees, and back.  He is not seeing pain management.  He is unable to stand for > 5 minutes without his legs starting to shake.  He is walking with a cane.  He had one fall in the past 6 months, he was unable to stand up by himself. He is trying to cut back on his alcohol, but he and his wife say that if he "ever did completely stop, he would die."  Medications:  Current Outpatient Prescriptions on File Prior to Visit  Medication Sig Dispense Refill  . ADVAIR DISKUS 250-50 MCG/DOSE AEPB Inhale 1 puff into  the lungs 2 (two) times daily.    Marland Kitchen albuterol (PROVENTIL HFA;VENTOLIN HFA) 108 (90 BASE) MCG/ACT inhaler Inhale 2 puffs into the lungs every 6 (six) hours as needed for wheezing or shortness of breath. 1 Inhaler 2  . ALPRAZolam (XANAX) 1 MG tablet Take 1 mg by mouth 2 (two) times daily.    Marland Kitchen glyBURIDE (DIABETA) 5 MG tablet     . glycopyrrolate (ROBINUL) 1 MG tablet     . lidocaine (XYLOCAINE) 5 % ointment Apply 1 application topically 3 (three) times daily as needed. Use gloves when applying to feet. 50 g 3  . lisinopril (PRINIVIL,ZESTRIL) 20 MG tablet Take 20 mg by mouth daily.    . metFORMIN (GLUCOPHAGE) 1000 MG tablet       . oxyCODONE-acetaminophen (PERCOCET) 10-325 MG per tablet Take 1 tablet by mouth every 4 (four) hours as needed for pain.     No current facility-administered medications on file prior to visit.    Allergies: No Known Allergies  Review of Systems:  CONSTITUTIONAL: No fevers, chills, night sweats, or weight loss.  EYES: No visual changes or eye pain ENT: No hearing changes.  No history of nose bleeds.   RESPIRATORY: No cough, wheezing and shortness of breath.   CARDIOVASCULAR: Negative for chest pain, and palpitations.   GI: Negative for abdominal discomfort, blood in stools or black stools.  No recent change in bowel habits.   GU:  No history of incontinence.   MUSCLOSKELETAL: +history of joint pain or swelling.  No myalgias.   SKIN: Negative for lesions, rash, and itching.   ENDOCRINE: Negative for cold or heat intolerance, polydipsia or goiter.   PSYCH:  + depression or anxiety symptoms.   NEURO: As Above.   Vital Signs:  BP 128/80 mmHg  Pulse 85  Ht 6\' 2"  (1.88 m)  Wt 287 lb 6 oz (130.352 kg)  BMI 36.88 kg/m2  SpO2 96%  Neurological Exam: MENTAL STATUS including orientation to time, place, person, recent and remote memory, attention span and concentration, language, and fund of knowledge is fair.  Speech is not dysarthric.  CRANIAL NERVES:  Pupils equal round and reactive to light.  Face is symmetric.  MOTOR:  Mild atrophy of right instrinsic hand muscles. No hand tremor, but his legs appear tremulous when standing. Left digits 4 and 5 with flexion deformity and hypotonia (old ulnar nerve injury). Motor strength 5/5 throughout except 3/5 left dorsal interosseus muscles and 5-/5 finger flexors.  MSRs:  Reflexes are 2+/4 throughout, except absent at the Achilles bilaterally.  SENSORY: Sensation to all modalities is reduced at mid-calf and absent in the feet.   COORDINATION/GAIT:  Wearing bilateral knee braces and assisted with single prong cane - gait appears antalgic  and unsteady.   Data: Lab 10/07/2014:  Vitamin B1 8, vitamin B6 9.1, folate 7.5, copper 78, ceruloplasmin, zinc 81, vitamin B12 370, TSH 0.906, SPEP/UPEP with IFE no M protein  EMG 11/13/2014 of the right upper and lower extremity: 1.  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 A superimposed right median neuropathy at the wrist and ulnar neuropathy at the elbow is likely. 2. Chronic L3-L4 radiculopathy affecting the right lower extremity, mild in degree electrically. 3. Chronic S1 radiculopathy affecting the right lower extremity, mild in degree electrically.   IMPRESSION/PLAN: Chronic pain syndrome with low back pain, painful neuropathy (diabetic and alcoholic), and severe arthralgias impairing his  gait.    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  Greater than 50% of this time was spent in counseling, explanation of diagnosis, planning of further management, and coordination of care.   Thank you for allowing me to participate in patient's care.  If I can answer  any additional questions, I would be pleased to do so.    Sincerely,    Tymier Lindholm K. [name], DO

## 2015-06-16 ENCOUNTER — Telehealth: Payer: Self-pay | Admitting: *Deleted

## 2015-06-16 ENCOUNTER — Telehealth: Payer: Self-pay | Admitting: Neurology

## 2015-06-16 NOTE — Telephone Encounter (Signed)
Left message for patient that the MRI has been authorized but there is no guarantee that it will be paid for.  Instructed him to contact his insurance to see if they can give him more information.

## 2015-06-16 NOTE — Telephone Encounter (Signed)
PA #: [phone]

## 2015-06-16 NOTE — Telephone Encounter (Signed)
Pt called and wanted to make sure his MRI will be paid for by the insurance company because they needed a referral from us/Dawn CB# [phone]

## 2015-06-16 NOTE — Telephone Encounter (Signed)
PA # [phone]

## 2015-06-18 ENCOUNTER — Ambulatory Visit
Admission: RE | Admit: 2015-06-18 | Discharge: 2015-06-18 | Disposition: A | Discharge status: Home / Self Care | Payer: 59 | Source: Ambulatory Visit | Attending: Neurology | Admitting: Neurology

## 2015-06-18 DIAGNOSIS — E0842 Diabetes mellitus due to underlying condition with diabetic polyneuropathy: Secondary | ICD-10-CM

## 2015-06-18 DIAGNOSIS — G894 Chronic pain syndrome: Secondary | ICD-10-CM

## 2015-06-18 DIAGNOSIS — G621 Alcoholic polyneuropathy: Secondary | ICD-10-CM

## 2015-06-18 DIAGNOSIS — G252 Other specified forms of tremor: Secondary | ICD-10-CM

## 2015-06-18 DIAGNOSIS — M545 Low back pain, unspecified: Secondary | ICD-10-CM

## 2015-06-18 DIAGNOSIS — M25569 Pain in unspecified knee: Secondary | ICD-10-CM

## 2015-06-18 DIAGNOSIS — G8929 Other chronic pain: Secondary | ICD-10-CM

## 2015-06-25 ENCOUNTER — Other Ambulatory Visit (HOSPITAL_COMMUNITY): Payer: Self-pay | Admitting: Internal Medicine

## 2015-06-25 DIAGNOSIS — G629 Polyneuropathy, unspecified: Secondary | ICD-10-CM

## 2015-06-30 ENCOUNTER — Ambulatory Visit (HOSPITAL_COMMUNITY)
Admission: RE | Admit: 2015-06-30 | Discharge: 2015-06-30 | Disposition: A | Discharge status: Home / Self Care | Payer: 59 | Source: Ambulatory Visit | Attending: Internal Medicine | Admitting: Internal Medicine

## 2015-06-30 DIAGNOSIS — E785 Hyperlipidemia, unspecified: Secondary | ICD-10-CM | POA: Insufficient documentation

## 2015-06-30 DIAGNOSIS — E119 Type 2 diabetes mellitus without complications: Secondary | ICD-10-CM | POA: Diagnosis not present

## 2015-06-30 DIAGNOSIS — G5790 Unspecified mononeuropathy of unspecified lower limb: Secondary | ICD-10-CM | POA: Diagnosis not present

## 2015-06-30 DIAGNOSIS — I10 Essential (primary) hypertension: Secondary | ICD-10-CM | POA: Insufficient documentation

## 2015-06-30 DIAGNOSIS — G629 Polyneuropathy, unspecified: Secondary | ICD-10-CM

## 2015-08-27 ENCOUNTER — Other Ambulatory Visit: Payer: Self-pay | Admitting: *Deleted

## 2015-08-27 MED ORDER — LIDOCAINE 5 % EX OINT: 1.0000 "application " | TOPICAL_OINTMENT | Freq: Three times a day (TID) | CUTANEOUS | Status: DC | PRN

## 2015-08-31 ENCOUNTER — Encounter: Payer: Self-pay | Admitting: Neurology

## 2015-08-31 ENCOUNTER — Ambulatory Visit (INDEPENDENT_AMBULATORY_CARE_PROVIDER_SITE_OTHER): Payer: 59 | Admitting: Neurology

## 2015-08-31 VITALS — BP 134/90 | HR 96 | Ht 74.0 in | Wt 291.2 lb

## 2015-08-31 DIAGNOSIS — E1142 Type 2 diabetes mellitus with diabetic polyneuropathy: Secondary | ICD-10-CM

## 2015-08-31 DIAGNOSIS — G621 Alcoholic polyneuropathy: Secondary | ICD-10-CM | POA: Diagnosis not present

## 2015-08-31 DIAGNOSIS — G252 Other specified forms of tremor: Secondary | ICD-10-CM | POA: Diagnosis not present

## 2015-08-31 DIAGNOSIS — E0842 Diabetes mellitus due to underlying condition with diabetic polyneuropathy: Secondary | ICD-10-CM

## 2015-08-31 NOTE — Progress Notes (Signed)
Note routed and PCP updated.

## 2015-08-31 NOTE — Progress Notes (Signed)
Follow-up Visit   Date: 08/31/2015   Eugene Diaz MRN: [id number] DOB: 1966-09-20   Interim History: Eugene Diaz is a 49 y.o. left-handed Caucasian male with diabetes mellitus (HbA1c 6.5), hypertension, asthma, and chronic back pain, and prior tobacco use (62.5 pack year) returning to the clinic for follow-up of painful alcoholic neuropathy.  The patient was accompanied to the clinic by wife who also provides collateral information.    History of present illness: Patient was not seeing a medical doctor until earlier in 2015 when he was diagnosed with uncontrolled diabetes and hypertension. His also has chronic symptoms including low back pain, knee pain, and burning paresthesias of the feet.  He reports having a bilateral feet pain, described as numbness/tingling/burning sensation for at least 10 years.  It started in his toes and over the years has progressed to involve the soles of the feet.  He also has similar tingling sensation involving the mid-calf and tips of the hands. He also complains of bilateral hand soreness and reports to having a harder time manipulating objects.  He was started on Norco in the fall 2015 and reports no significant benefit.    Additionally, he also admits to a long history of alcohol use which started in his early teens.  He currently drinks 3-4 beers and liquor 4-6oz nightly for at least the past 35 years.  On weekend, he may drink up to a 12 pack of beer.    Of note, he injured his medial forearm at the age of ~55 while at work and sustained nerve and tendon injury. Since then, his last two fingers have been weak and medial hand has been numb.  UPDATE 12/01/2014: He is stopped liquor and red apple ale, but continues to drink about 6-pack a night. He has a lot of generalized pain of his joints, low back, and neuropathic pain of his hands and feet.  He takes oxycodone twice a daily but does not have any improvement to his pain.  He is currently taking  gabapentin 600mg  BID without any benefit or side effect.    UPDATE 02/06/2015:  Tremors are significantly improved after starting primidone 25mg  bid.  Pain is severe and involves his back, legs, feet, and knees.  Despite increasing his neurontin and adding nortriptyline, there is no change.  He had to cancel his appointment with ortho for his knee pain due to insurance reasons.    UPDATE 06/08/2015:  Symptoms are no better and feels as if his pain is worse.  He has generalized pain of his legs, knees, and back.  He is not seeing pain management.  He is unable to stand for > 5 minutes without his legs starting to shake.  He is walking with a cane.  He had one fall in the past 6 months, he was unable to stand up by himself. He is trying to cut back on his alcohol, but he and his wife say that if he "ever did completely stop, he would die."  UPDATE 08/31/2015:  Because of his chronic and generalized pain, he was referred to pain mananagement but unclear why he did not follow-up.  He switched his PCP to Dr. Gerarda Fraction who recently took him off nortriptyline and added Cymbalta 30mg  daily. He has not noticed any benefit.  Primidone was increase to 50mg  twice daily which has significantly controlled his tremors.  He gets the greatest benefit with lidocaine ointment, but always ends up using too much to last him through  the month.  Again, he complains of generalized joint pain and stiffness of his hands, back, hip, and knees.  His knees continue to buckle and he is falling frequently. He has no seen an orthopeadic specialist. He wears a knee brace for support and is now walking with a walker.   Medications:  Current Outpatient Prescriptions on File Prior to Visit  Medication Sig Dispense Refill  . ADVAIR DISKUS 250-50 MCG/DOSE AEPB Inhale 1 puff into the lungs 2 (two) times daily.    Marland Kitchen albuterol (PROVENTIL HFA;VENTOLIN HFA) 108 (90 BASE) MCG/ACT inhaler Inhale 2 puffs into the lungs every 6 (six) hours as needed for  wheezing or shortness of breath. 1 Inhaler 2  . glyBURIDE (DIABETA) 5 MG tablet     . metFORMIN (GLUCOPHAGE) 1000 MG tablet     . oxyCODONE-acetaminophen (PERCOCET) 10-325 MG per tablet Take 1 tablet by mouth every 4 (four) hours as needed for pain.    . primidone (MYSOLINE) 50 MG tablet Take 1 tablet (50 mg total) by mouth 2 (two) times daily. 60 tablet 5   No current facility-administered medications on file prior to visit.    Allergies: No Known Allergies  Review of Systems:  CONSTITUTIONAL: No fevers, chills, night sweats, or weight loss.  EYES: No visual changes or eye pain ENT: No hearing changes.  No history of nose bleeds.   RESPIRATORY: No cough, wheezing and shortness of breath.   CARDIOVASCULAR: Negative for chest pain, and palpitations.   GI: Negative for abdominal discomfort, blood in stools or black stools.  No recent change in bowel habits.   GU:  No history of incontinence.   MUSCLOSKELETAL: +history of joint pain or swelling.  No myalgias.   SKIN: Negative for lesions, rash, and itching.   ENDOCRINE: Negative for cold or heat intolerance, polydipsia or goiter.   PSYCH:  + depression or anxiety symptoms.   NEURO: As Above.   Vital Signs:  BP 134/90 mmHg  Pulse 96  Ht 6\' 2"  (1.88 m)  Wt 291 lb 4 oz (132.11 kg)  BMI 37.38 kg/m2  SpO2 98%  Neurological Exam: MENTAL STATUS including orientation to time, place, person, recent and remote memory, attention span and concentration, language, and fund of knowledge is fair.  Speech is not dysarthric.  Appears very uncomfortable, dishevled.   CRANIAL NERVES:  Pupils equal round and reactive to light.  Face is symmetric.  MOTOR:  Mild atrophy of right instrinsic hand muscles. No hand tremor. Left digits 4 and 5 with flexion deformity and hypotonia (old ulnar nerve injury). Motor strength 5/5 throughout except 3/5 left dorsal interosseus muscles and 5-/5 finger flexors.  MSRs:  Reflexes are 2+/4 throughout, except  absent at the Achilles bilaterally.  SENSORY: Sensation to all modalities is reduced at mid-calf and absent in the feet.   COORDINATION/GAIT:  Wearing bilateral knee braces and assisted with walker - gait appears antalgic and unsteady.   Data: Lab 10/07/2014:  Vitamin B1 8, vitamin B6 9.1, folate 7.5, copper 78, ceruloplasmin, zinc 81, vitamin B12 370, TSH 0.906, SPEP/UPEP with IFE no M protein  EMG 11/13/2014 of the right upper and lower extremity: 1.  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 A superimposed right median neuropathy at the wrist and ulnar neuropathy at the elbow is likely. 2. Chronic L3-L4 radiculopathy affecting the right lower extremity, mild in degree electrically. 3. Chronic S1 radiculopathy affecting  the right lower extremity, mild in degree electrically.   IMPRESSION/PLAN: Chronic pain syndrome with low back pain, painful neuropathy (diabetic and alcoholic), and severe arthralgias impairing his gait.   - Pain management referral previously sent, but patient did not follow-up for appointment.  His pain is out of proportion than would be expected for neuropathy and may be that he as overlapping arthralgias as well as chronic pain.  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     Return to clinic in 4 months   The duration of this appointment visit was 30 minutes of face-to-face time with the patient.  Greater than 50% of this time was spent in counseling, explanation of diagnosis, planning of further management, and coordination of care.   Thank you for allowing me to participate in patient's care.  If I can answer any additional questions, I would be pleased to do so.    Sincerely,    Hyacinth Marcelli K. [name], DO

## 2015-08-31 NOTE — Patient Instructions (Addendum)
1.  Increase Cymbalta to 60mg  daily, if okay with your primary care doctor 2.  Adjust your gabapentin 900mg  in the morning and afternoon and 1200mg  (4 tablets) at bedtime 3.  Continue primidone 50mg  twice daily 4.  Reduce cyclobenzaprine 5mg  at bedtime for one week, then stop.   5.  Please try to cut back on your alcohol use 6.  Follow-up with your primary care doctor for joint pain 7.  Return to clinic in 4 months

## 2015-09-16 ENCOUNTER — Other Ambulatory Visit: Payer: Self-pay | Admitting: *Deleted

## 2015-09-16 ENCOUNTER — Telehealth: Payer: Self-pay | Admitting: *Deleted

## 2015-09-16 DIAGNOSIS — G609 Hereditary and idiopathic neuropathy, unspecified: Secondary | ICD-10-CM

## 2015-09-16 MED ORDER — LIDOCAINE 5 % EX OINT: 1.0000 "application " | TOPICAL_OINTMENT | CUTANEOUS | Status: DC | PRN

## 2015-09-16 NOTE — Telephone Encounter (Signed)
Patient is requesting a letter from you stating that there is nothing else that you can do for him.  Please advise.

## 2015-09-16 NOTE — Telephone Encounter (Signed)
Who is this letter addressed to and what is the purpose?  It is true that from a neurological stand-point, I do not have anything else to offer for his chronic pain and recommended pain management.

## 2015-09-18 NOTE — Telephone Encounter (Signed)
He would like it to be addressed to whom it may concern.  He would like to give it to the [facility] office for his disability.

## 2015-09-22 ENCOUNTER — Encounter: Payer: Self-pay | Admitting: Neurology

## 2015-09-22 NOTE — Telephone Encounter (Signed)
Done

## 2015-09-23 NOTE — Telephone Encounter (Signed)
Called patient and informed him that letter has been mailed.

## 2015-11-06 ENCOUNTER — Telehealth: Payer: Self-pay | Admitting: Neurology

## 2015-11-06 ENCOUNTER — Ambulatory Visit: Payer: 59 | Admitting: Neurology

## 2015-11-06 NOTE — Telephone Encounter (Signed)
Spoke with patient. All questions answered about neuropathy to the best of my ability.

## 2015-11-06 NOTE — Telephone Encounter (Signed)
PT called and has a question regarding his neuropathy/Dawn CB# [phone]

## 2015-11-18 ENCOUNTER — Telehealth: Payer: Self-pay | Admitting: *Deleted

## 2015-11-18 ENCOUNTER — Ambulatory Visit (HOSPITAL_COMMUNITY)
Admission: RE | Admit: 2015-11-18 | Discharge: 2015-11-18 | Disposition: A | Discharge status: Home / Self Care | Payer: BLUE CROSS/BLUE SHIELD | Source: Ambulatory Visit | Attending: Orthopaedic Surgery | Admitting: Orthopaedic Surgery

## 2015-11-18 ENCOUNTER — Other Ambulatory Visit: Payer: Self-pay | Admitting: Orthopaedic Surgery

## 2015-11-18 DIAGNOSIS — M25362 Other instability, left knee: Secondary | ICD-10-CM

## 2015-11-18 DIAGNOSIS — M25562 Pain in left knee: Secondary | ICD-10-CM | POA: Insufficient documentation

## 2015-11-18 DIAGNOSIS — M94262 Chondromalacia, left knee: Secondary | ICD-10-CM | POA: Insufficient documentation

## 2015-11-18 DIAGNOSIS — G609 Hereditary and idiopathic neuropathy, unspecified: Secondary | ICD-10-CM

## 2015-11-18 NOTE — Telephone Encounter (Signed)
Patient called about his lidocaine.  He spoke with his pharmacist and she suggested that maybe we could call in 2 tubes per month since he runs out of it so early.  Please advise.

## 2015-11-18 NOTE — Telephone Encounter (Signed)
1 

## 2015-11-19 MED ORDER — LIDOCAINE 5 % EX OINT: 1.0000 "application " | TOPICAL_OINTMENT | Freq: Two times a day (BID) | CUTANEOUS | Status: DC | PRN

## 2015-11-19 NOTE — Addendum Note (Signed)
Addended by: Alda Berthold on: 11/19/2015 10:58 AM   Modules accepted: Orders

## 2015-11-19 NOTE — Telephone Encounter (Signed)
Sent refill for 50g tube instead.  Please inform patient.   Avry Monteleone K. [name], DO

## 2015-11-19 NOTE — Telephone Encounter (Signed)
Patient notified

## 2015-12-01 ENCOUNTER — Inpatient Hospital Stay (HOSPITAL_COMMUNITY): Admission: RE | Admit: 2015-12-01 | Payer: Self-pay | Source: Ambulatory Visit

## 2015-12-03 ENCOUNTER — Encounter: Payer: Self-pay | Admitting: Orthopaedic Surgery

## 2015-12-03 ENCOUNTER — Ambulatory Visit (INDEPENDENT_AMBULATORY_CARE_PROVIDER_SITE_OTHER): Payer: BLUE CROSS/BLUE SHIELD | Admitting: Orthopaedic Surgery

## 2015-12-03 VITALS — BP 137/87 | HR 84 | Temp 97.3°F | Ht 73.0 in | Wt 291.0 lb

## 2015-12-03 DIAGNOSIS — M25562 Pain in left knee: Secondary | ICD-10-CM

## 2015-12-03 NOTE — Patient Instructions (Signed)
Call if any problems, precautions discussed, continue current medications 

## 2015-12-04 DIAGNOSIS — M25562 Pain in left knee: Secondary | ICD-10-CM | POA: Insufficient documentation

## 2015-12-04 NOTE — Progress Notes (Addendum)
Patient [id number] Eugene Diaz, male DOB:July 22, 1966, 50 y.o. BP:[id number]  Chief Complaint  Patient presents with  . Follow-up    follow up Left knee, review MRI results    HPI  Eugene Diaz is a 50 y.o. male he had a MRI of the left knee on 11/18/15 at the hospital.  I have reviewed the MRI and also have reviewed and received the report.  I am surprised that he has basically a normal MRI of the left knee other than some chondromalacia of the patella.    He has significant peripheral neuropathy and that must also be affecting his left knee.  I have spent some time today again explaining to him about peripheral neuropathy to him and his wife.  He has to use knee sleeves and a cane when walking.  The problem is not the internal problems of the knee such as a meniscus tear or ligamentous tear but rather his inability to fully appreciate his location in space when walking and not getting the normal return from his nerves and abnormal sensation.  He has a tendency to fall as he does not have proper feedback from his lower extremities.  I have recommended he continue his cane and even consider a walker.   Knee Pain  The incident occurred more than 1 week ago. The injury mechanism is unknown. The pain is present in the left knee. The quality of the pain is described as aching and burning. The pain is at a severity of 5/10. The pain is moderate. The pain has been fluctuating since onset. Associated symptoms include an inability to bear weight. He reports no foreign bodies present. The symptoms are aggravated by weight bearing. He has tried ice, heat, acetaminophen, immobilization and NSAIDs for the symptoms. The treatment provided mild relief.    Review of Systems Review of Systems  HENT: Negative for congestion.   Eyes: Negative.   Respiratory: Positive for shortness of breath and wheezing.   Cardiovascular: Negative for chest pain.       Hypertension  Endocrine:       Diabetes Mellitus with  complication of severe peripheral neuropathy  Musculoskeletal: Positive for joint swelling and gait problem.    Past Medical History  Diagnosis Date  . Diabetes mellitus without complication ([place]) 0000000  . Hypertension   . Hypercholesterolemia   . Peripheral vascular disease ([place])   . Shortness of breath   . Asthma     Past Surgical History  Procedure Laterality Date  . Left arm      Left arm repair (tendon and artery)  . Pilonidal cyst excision N/A 08/08/2014    Procedure: CYST EXCISION PILONIDAL EXTENSIVE;  Surgeon: Jamesetta So, MD;  Location: [facility];  Service: General;  Laterality: N/A;    Family History  Problem Relation Age of Onset  . Heart attack Mother     Living, 50  . Cancer Father     Deceased  . Healthy Brother   . Healthy Sister     Social History Social History  Substance Use Topics  . Smoking status: Former Smoker -- 2.50 packs/day for 25 years    Types: Cigarettes    Quit date: 08/05/1984  . Smokeless tobacco: Never Used  . Alcohol Use: 30.0 oz/week    50 Standard drinks or equivalent per week     Comment: Drinks 3-4 beers, liquor 4-6oz x 35 years    No Known Allergies  Current Outpatient Prescriptions  Medication Sig Dispense Refill  .  ADVAIR DISKUS 250-50 MCG/DOSE AEPB Inhale 1 puff into the lungs 2 (two) times daily.    Marland Kitchen albuterol (PROVENTIL HFA;VENTOLIN HFA) 108 (90 BASE) MCG/ACT inhaler Inhale 2 puffs into the lungs every 6 (six) hours as needed for wheezing or shortness of breath. 1 Inhaler 2  . DULoxetine (CYMBALTA) 30 MG capsule     . gabapentin (NEURONTIN) 300 MG capsule Take 900 mg by mouth 3 (three) times daily.    Marland Kitchen lidocaine (XYLOCAINE) 5 % ointment Apply 1 application topically 2 (two) times daily as needed. 50 g 5  . oxyCODONE-acetaminophen (PERCOCET) 10-325 MG per tablet Take 1 tablet by mouth every 4 (four) hours as needed for pain.    . primidone (MYSOLINE) 50 MG tablet Take 1 tablet (50 mg total) by mouth 2 (two) times  daily. 60 tablet 5  . VOLTAREN 1 % GEL      No current facility-administered medications for this visit.     Physical Exam  Blood pressure 137/87, pulse 84, temperature 97.3 F (36.3 C), height 6\' 1"  (1.854 m), weight 291 lb (131.997 kg).  Constitutional: overall normal hygiene, normal nutrition, well developed, normal grooming, normal body habitus. Assistive device:cane and bilateral knee sleeves  Musculoskeletal: gait and station limp to the left, muscle tone and strength are normal, no tremors or atrophy is present. Limp left .  Neurological: coordination overall normal.  Deep tendon reflex/nerve stretch intact.  Sensation abnormal of the lower extremities secondary to peripheral neuropathy.  Cranial nerves II-XII intact.   Skin: negative overall no scars, lesions, ulcers or rash es. No psoriasis.  Psychiatric: Alert and oriented x 3.  Recent memory intact, remote memory unclear.  Normal mood and affect. Well groomed.  Good eye contact.  Cardiovascular: overall no swelling, no varicosities, no edema bilaterally, normal temperatures of the legs and arms, no clubbing, cyanosis and good capillary refill.  Lymphatic: palpation is normal.    Extremities:Both knees with knee sleeves. Limp to the left Inspection slight effusion of both knees with crepitus. Strength and tone normal but decreased sensation consistent with peripheral neuropathy Range of motion 0 to 100 left, some crepitus. And 0 to 110 right with some crepitus.  Additional services performed: Again I spent some time talking about peripheral neuropathy with the patient and his wife.  They will discuss further with his neurologist.  He needs to use visual cues when walking and use preferably a walker.  PLAN Call if any problems.  Precautions discussed.  Continue current medications.   Return to clinic as needed.

## 2015-12-31 ENCOUNTER — Ambulatory Visit (INDEPENDENT_AMBULATORY_CARE_PROVIDER_SITE_OTHER): Payer: BLUE CROSS/BLUE SHIELD | Admitting: Neurology

## 2015-12-31 ENCOUNTER — Encounter: Payer: Self-pay | Admitting: Neurology

## 2015-12-31 VITALS — BP 138/88 | HR 104 | Ht 73.0 in | Wt 288.6 lb

## 2015-12-31 DIAGNOSIS — S90934A Unspecified superficial injury of right lesser toe(s), initial encounter: Secondary | ICD-10-CM

## 2015-12-31 DIAGNOSIS — G609 Hereditary and idiopathic neuropathy, unspecified: Secondary | ICD-10-CM | POA: Diagnosis not present

## 2015-12-31 DIAGNOSIS — E0842 Diabetes mellitus due to underlying condition with diabetic polyneuropathy: Secondary | ICD-10-CM | POA: Diagnosis not present

## 2015-12-31 DIAGNOSIS — L089 Local infection of the skin and subcutaneous tissue, unspecified: Secondary | ICD-10-CM

## 2015-12-31 DIAGNOSIS — G621 Alcoholic polyneuropathy: Secondary | ICD-10-CM | POA: Diagnosis not present

## 2015-12-31 MED ORDER — LIDOCAINE 5 % EX OINT: 1.0000 "application " | TOPICAL_OINTMENT | Freq: Two times a day (BID) | CUTANEOUS | Status: DC | PRN

## 2015-12-31 NOTE — Patient Instructions (Signed)
1.  Start physical therapy for gait training 2.  Referral to rheumatology for joint pain and swelling 3.  Please call with your medication list  Return to clinic in 5 months

## 2015-12-31 NOTE — Progress Notes (Signed)
Follow-up Visit   Date: 12/31/2015   Eugene Diaz MRN: [id number] DOB: 09/15/1966   Interim History: Eugene Diaz is a 50 y.o. left-handed Caucasian male with diabetes mellitus (HbA1c 6.5), hypertension, asthma, and chronic back pain, and prior tobacco use (62.5 pack year) returning to the clinic for follow-up of painful alcoholic neuropathy.  The patient was accompanied to the clinic by wife who also provides collateral information.    History of present illness: Patient was not seeing a medical doctor until earlier in 2015 when he was diagnosed with uncontrolled diabetes and hypertension. His also has chronic symptoms including low back pain, knee pain, and burning paresthesias of the feet.  He reports having a bilateral feet pain, described as numbness/tingling/burning sensation for at least 10 years.  It started in his toes and over the years has progressed to involve the soles of the feet.  He also has similar tingling sensation involving the mid-calf and tips of the hands. He also complains of bilateral hand soreness and reports to having a harder time manipulating objects.  He was started on Norco in the fall 2015 and reports no significant benefit.    Additionally, he also admits to a long history of alcohol use which started in his early teens.  He currently drinks 3-4 beers and liquor 4-6oz nightly for at least the past 35 years.  On weekend, he may drink up to a 12 pack of beer.    Of note, he injured his medial forearm at the age of ~25 while at work and sustained nerve and tendon injury. Since then, his last two fingers have been weak and medial hand has been numb.  UPDATE 12/01/2014: He is stopped liquor and red apple ale, but continues to drink about 6-pack a night. He has a lot of generalized pain of his joints, low back, and neuropathic pain of his hands and feet.  He takes oxycodone twice a daily but does not have any improvement to his pain.  He is currently taking  gabapentin 600mg  BID without any benefit or side effect.    UPDATE 06/08/2015:  Tremors are significantly improved after starting primidone 25mg  bid.  Symptoms are no better and feels as if his pain is worse.  He has generalized pain of his legs, knees, and back.  He is not seeing pain management.  He is unable to stand for > 5 minutes without his legs starting to shake.  He is walking with a cane.  He had one fall in the past 6 months, he was unable to stand up by himself. He is trying to cut back on his alcohol, but he and his wife say that if he "ever did completely stop, he would die."  UPDATE 08/31/2015:  Because of his chronic and generalized pain, he was referred to pain mananagement but unclear why he did not follow-up.  He switched his PCP to Dr. Gerarda Fraction who recently took him off nortriptyline and added Cymbalta 30mg  daily. He has not noticed any benefit.  Primidone was increase to 50mg  twice daily which has significantly controlled his tremors.  He gets the greatest benefit with lidocaine ointment, but always ends up using too much to last him through the month.  Again, he complains of generalized joint pain and stiffness of his hands, back, hip, and knees.  His knees continue to buckle and he is falling frequently. He has no seen an orthopeadic specialist. He wears a knee brace for support and is  now walking with a walker.   UPDATE 12/31/2015:  He continues to have chronic generalized pain of the legs and joints.  He has burning, tingling pain of the feet and numbness to the level of his knees and elbows.  He states that he can pull the hairs out of his legs and arms and cannot feel it.  A few days ago, he was cutting his toe nails with a knife and accidentally cut into his toe. It has not been draining and he has been watching it closely.  He saw sport medicine for his knee pain who performed left knee MRI which did not show signs of arthritis so was told to follow-up as needed.  He continues to have  frequent falls due to sensory ataxia and uses a walker as needed.  He also complains of elbow pain and swelling which is new.     Medications:  Current Outpatient Prescriptions on File Prior to Visit  Medication Sig Dispense Refill  . ADVAIR DISKUS 250-50 MCG/DOSE AEPB Inhale 1 puff into the lungs 2 (two) times daily.    Marland Kitchen albuterol (PROVENTIL HFA;VENTOLIN HFA) 108 (90 BASE) MCG/ACT inhaler Inhale 2 puffs into the lungs every 6 (six) hours as needed for wheezing or shortness of breath. 1 Inhaler 2  . gabapentin (NEURONTIN) 300 MG capsule Take 900 mg by mouth 3 (three) times daily. 900 am, 900 noon, 1200 qhs    . oxyCODONE-acetaminophen (PERCOCET) 10-325 MG per tablet Take 1 tablet by mouth every 4 (four) hours as needed for pain.    . primidone (MYSOLINE) 50 MG tablet Take 1 tablet (50 mg total) by mouth 2 (two) times daily. 60 tablet 5  . VOLTAREN 1 % GEL      No current facility-administered medications on file prior to visit.    Allergies: No Known Allergies  Review of Systems:  CONSTITUTIONAL: No fevers, chills, night sweats, or weight loss.  EYES: No visual changes or eye pain ENT: No hearing changes.  No history of nose bleeds.   RESPIRATORY: No cough, wheezing and shortness of breath.   CARDIOVASCULAR: Negative for chest pain, and palpitations.   GI: Negative for abdominal discomfort, blood in stools or black stools.  No recent change in bowel habits.   GU:  No history of incontinence.   MUSCLOSKELETAL: +history of joint pain or swelling.  No myalgias.   SKIN: Negative for lesions, rash, and itching.   ENDOCRINE: Negative for cold or heat intolerance, polydipsia or goiter.   PSYCH:  + depression or anxiety symptoms.   NEURO: As Above.   Vital Signs:  BP 138/88 mmHg  Pulse 104  Ht 6\' 1"  (1.854 m)  Wt 288 lb 9 oz (130.891 kg)  BMI 38.08 kg/m2  SpO2 95%  Neurological Exam: MENTAL STATUS including orientation to time, place, person, recent and remote memory, attention span  and concentration, language, and fund of knowledge is fair.  Speech is not dysarthric.  Appears very uncomfortable, dishevled.   CRANIAL NERVES:  Pupils equal round and reactive to light.  Face is symmetric.  MOTOR:  Mild atrophy of right instrinsic hand muscles. No hand tremor. Left digits 4 and 5 with flexion deformity and hypotonia (old ulnar nerve injury). Motor strength 5/5 throughout except 3/5 left dorsal interosseus, 5-/5 finger flexors, and 4+/5 bilateral dorsiflexion and toe extensors/flexors.    Right great toe shows dark discoloration under the nail bed without swelling or drainage.  There is mild erythema.  MSRs:  Reflexes are  2+/4 throughout, except absent at the Achilles bilaterally.  SENSORY: Sensation to all modalities is reduced at mid-calf and absent in the feet.   COORDINATION/GAIT:  Wearing bilateral knee braces and assisted with cane and shows antalgic and unsteady gait.   Data: Lab 10/07/2014:  Vitamin B1 8, vitamin B6 9.1, folate 7.5, copper 78, ceruloplasmin, zinc 81, vitamin B12 370, TSH 0.906, SPEP/UPEP with IFE no M protein  EMG 11/13/2014 of the right upper and lower extremity: 1.  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 A superimposed right median neuropathy at the wrist and ulnar neuropathy at the elbow is likely. 2. Chronic L3-L4 radiculopathy affecting the right lower extremity, mild in degree electrically. 3. Chronic S1 radiculopathy affecting the right lower extremity, mild in degree electrically.   IMPRESSION/PLAN: Chronic pain syndrome with low back pain, painful neuropathy (diabetic and alcoholic), and arthralgias impairing his gait.   - Despite making adjustments to neurontin, nortriptyline, and being on percocet (prescribed by PCP), there has been no improvement in pain - Continue neurontin 900mg  in the  morning and afternoon, add 1200mg  at bedtime - Patient to call with medication list as he is unsure what dose of Cymbalta he is taking and whether he is on nortriptyline - Refill provided for lidocaine ointment - Start physical therapy for gait training - Encouraged to use a rollator, fall precautions discussed  Polyarthralgias and swelling - Referral to rheumatology for their opinion especially as his pain is out of proportion than what is typical for neuropathy  Alcohol induced tremor, stable - Strongly encouraged him to continue to wean his drinking, currently still drinking 6 beers nightly - Continue primidone 50mg  twice daily  Right great toe traumatic injury with superficial infection, instructed to apply antibiotic ointment and follow-up with PCP if there is any drainage or poor healing  Return to clinic in 4 months   The duration of this appointment visit was 30 minutes of face-to-face time with the patient.  Greater than 50% of this time was spent in counseling, explanation of diagnosis, planning of further management, and coordination of care.   Thank you for allowing me to participate in patient's care.  If I can answer any additional questions, I would be pleased to do so.    Sincerely,    Tollie Canada K. [name], DO

## 2016-01-01 ENCOUNTER — Other Ambulatory Visit: Payer: Self-pay | Admitting: *Deleted

## 2016-01-01 DIAGNOSIS — M545 Low back pain, unspecified: Secondary | ICD-10-CM

## 2016-01-01 DIAGNOSIS — L089 Local infection of the skin and subcutaneous tissue, unspecified: Secondary | ICD-10-CM

## 2016-01-01 DIAGNOSIS — E0842 Diabetes mellitus due to underlying condition with diabetic polyneuropathy: Secondary | ICD-10-CM

## 2016-01-01 DIAGNOSIS — G609 Hereditary and idiopathic neuropathy, unspecified: Secondary | ICD-10-CM

## 2016-01-01 DIAGNOSIS — G894 Chronic pain syndrome: Secondary | ICD-10-CM

## 2016-01-01 DIAGNOSIS — S90934A Unspecified superficial injury of right lesser toe(s), initial encounter: Secondary | ICD-10-CM

## 2016-01-01 DIAGNOSIS — G8929 Other chronic pain: Secondary | ICD-10-CM

## 2016-01-01 DIAGNOSIS — M25569 Pain in unspecified knee: Secondary | ICD-10-CM

## 2016-01-01 DIAGNOSIS — G621 Alcoholic polyneuropathy: Secondary | ICD-10-CM

## 2016-01-14 ENCOUNTER — Ambulatory Visit (HOSPITAL_COMMUNITY): Payer: BLUE CROSS/BLUE SHIELD | Attending: Neurology | Admitting: Physical Therapy

## 2016-01-14 DIAGNOSIS — M25561 Pain in right knee: Secondary | ICD-10-CM | POA: Diagnosis present

## 2016-01-14 DIAGNOSIS — R2681 Unsteadiness on feet: Secondary | ICD-10-CM | POA: Insufficient documentation

## 2016-01-14 DIAGNOSIS — R262 Difficulty in walking, not elsewhere classified: Secondary | ICD-10-CM | POA: Insufficient documentation

## 2016-01-14 DIAGNOSIS — M2560 Stiffness of unspecified joint, not elsewhere classified: Secondary | ICD-10-CM | POA: Insufficient documentation

## 2016-01-14 DIAGNOSIS — M545 Low back pain: Secondary | ICD-10-CM | POA: Insufficient documentation

## 2016-01-14 DIAGNOSIS — M6281 Muscle weakness (generalized): Secondary | ICD-10-CM | POA: Diagnosis present

## 2016-01-14 DIAGNOSIS — M25562 Pain in left knee: Secondary | ICD-10-CM | POA: Insufficient documentation

## 2016-01-14 NOTE — Therapy (Signed)
[place], [place], [address] Phone: [phone]   Fax:  [phone]  Physical Therapy Evaluation  Patient Details  Name: Eugene Diaz MRN: [id number] Date of Birth: 07/05/1966 Referring Provider: Narda Amber   Encounter Date: 01/14/2016      PT End of Session - 01/14/16 1531    Visit Number 1   Number of Visits 16   Date for PT Re-Evaluation 02/11/16   Authorization Type BCBS    Authorization Time Period 01/14/16 to 03/15/16   Authorization - Visit Number 1   Authorization - Number of Visits 30   PT Start Time [id number]   PT Stop Time 1525   PT Time Calculation (min) 52 min   Activity Tolerance Patient tolerated treatment well   Behavior During Therapy [facility] for tasks assessed/performed      Past Medical History  Diagnosis Date  . Diabetes mellitus without complication ([place]) 0000000  . Hypertension   . Hypercholesterolemia   . Peripheral vascular disease ([place])   . Shortness of breath   . Asthma     Past Surgical History  Procedure Laterality Date  . Left arm      Left arm repair (tendon and artery)  . Pilonidal cyst excision N/A 08/08/2014    Procedure: CYST EXCISION PILONIDAL EXTENSIVE;  Surgeon: Jamesetta So, MD;  Location: [facility];  Service: General;  Laterality: N/A;    There were no vitals filed for this visit.  Visit Diagnosis:  Midline low back pain, with sciatica presence unspecified - Plan: PT plan of care cert/re-cert  Bilateral knee pain - Plan: PT plan of care cert/re-cert  Joint stiffness of multiple sites - Plan: PT plan of care cert/re-cert  Muscle weakness - Plan: PT plan of care cert/re-cert  Difficulty walking - Plan: PT plan of care cert/re-cert  Unsteadiness - Plan: PT plan of care cert/re-cert      Subjective Assessment - 01/14/16 1439    Subjective Patient reports that his back has always hurt; he has always lifted heavy things and was always pickting things up and  attributes back pain to this. Knees and feet have been hurting him about 3 years; just started on its own without any injury or event that exacerbated them. Getting up and moving makes pain worse, colder weatjer also makes pain worse and patient complains that he really has not been able to get outdoors much at all. Resting makes him feel better. Has fallen about 5 times in the past 90 days; he reports that this has been due to weakness in his legs and they will give out on him.    Pertinent History HTN, diabetes with neuropathy, hx of chest pain, hx of ETOH abuse, PVD, asthma (carries inhaler), thrombocytopenia    How long can you stand comfortably? 15-20 minutes   How long can you walk comfortably? household distances, maybe 60-33ft    Patient Stated Goals get stronger, be able to stand and move, be able to get outside    Currently in Pain? Yes   Pain Score 5    Pain Location Back   Pain Orientation Mid;Left   Pain Descriptors / Indicators Shooting;Stabbing   Pain Type Chronic pain   Pain Radiating Towards runs into hips    Pain Onset More than a month ago   Pain Frequency Constant   Aggravating Factors  getting up and mvooing, cold weather    Pain Relieving Factors resting    Effect  of Pain on Daily Activities very weak, has a hard time performing physical tasks    Multiple Pain Sites Yes   Pain Score 5   Pain Location Knee   Pain Orientation Right;Left   Pain Descriptors / Indicators Discomfort;Sore   Pain Type Chronic pain   Pain Radiating Towards none    Pain Onset More than a month ago   Pain Frequency Constant   Aggravating Factors  weight bearing    Pain Relieving Factors resting    Effect of Pain on Daily Activities limits activity            [facility] PT Assessment - 01/14/16 0001    Assessment   Medical Diagnosis chronic LBP, knee pain    Referring Provider Narda Amber    Onset Date/Surgical Date --  chronic    Next MD Visit Dr. [name] in a couple months    Balance  Screen   Has the patient fallen in the past 6 months Yes   How many times? 5   Has the patient had a decrease in activity level because of a fear of falling?  Yes   Is the patient reluctant to leave their home because of a fear of falling?  Yes   Prior Function   Level of Independence Independent;Independent with basic ADLs   Vocation Unemployed   Leisure getting outside and more active, tinkering    Observation/Other Assessments   Focus on Therapeutic Outcomes (FOTO)  59% limited for back    Posture/Postural Control   Posture Comments flexed at hips, increaed weight bearing and knee flexion L LE, possible LLD with R pelvic crest higher, flat spinal curves, forward head, weight on heels in stance    AROM   Overall AROM Comments hip stiffness on all tested planes, active and passive    Right Hip External Rotation  --  signfiicant tightness    Right Hip Internal Rotation  --  significant tightness    Left Hip External Rotation  --  significant tightness    Left Hip Internal Rotation  --  significant tightness    Lumbar Flexion 70   Lumbar Extension 15   Lumbar - Right Side Bend just past midline of jonit    Lumbar - Left Side Bend just past midline of joint   Strength   Right Hip Flexion 3/5   Right Hip Extension 2/5   Right Hip ABduction 2/5   Left Hip Flexion 3/5   Left Hip Extension 2/5   Left Hip ABduction 2+/5   Right Knee Flexion 3-/5   Right Knee Extension 3/5   Left Knee Flexion 3-/5   Left Knee Extension 3-/5   Right Ankle Dorsiflexion 3+/5   Left Ankle Dorsiflexion 3+/5   Ambulation/Gait   Gait Comments reduced gait speed, reduced turnk/hip rotation, flexed at hips, varus knees, reduced step/stance    Timed Up and Go Test   Normal TUG (seconds) 15   TUG Comments cane                            PT Education - 01/14/16 1531    Education provided Yes   Education Details plan of care, prognosis, HEP    Person(s) Educated Patient   Methods  Explanation;Handout   Comprehension Verbalized understanding;Returned demonstration;Need further instruction          PT Short Term Goals - 01/14/16 1542    PT SHORT TERM GOAL #1  Title Patient will experience pain in his low back and knees no more than 3/10 in order to assist in improving general mobiltiy and ability to participate in functional tasks    Time 4   Period Weeks   Status New   PT SHORT TERM GOAL #2   Title Patient will be able to maintain correct posture wiith equal weight bearing and upright stance at least 75% of the time in order to assist in reducing back pain and to improve overall mechanics    Time 4   Period Weeks   Status New   PT SHORT TERM GOAL #3   Title Patient to demonstrate improved gait pattern with equal weight bearing, good heel-toe pattern, upright posture, equal step/stance, with LRAD or no device in order to assist in improving general mobiltiy    Time 4   Period Weeks   Status New   PT SHORT TERM GOAL #4   Title Patient to be independent in verbally explaining importance of checking his feet for injury and foreign objects on a daily basis, also to be able to verbally explain the importance of wearing shoes in order to protect feet and prevent development of wounds and infection for good general health status    Time 4   Period Weeks   Status New   PT SHORT TERM GOAL #5   Title Patient will be independent in correctly and consistently performing appropriate HEP, to be updated PRN    Time 4   Period Weeks   Status New           PT Long Term Goals - 01/14/16 1546    PT LONG TERM GOAL #1   Title Patient will demonstrate strength in all tested muscle groups at least 4/5 in order to reduce pain and assist in improving general level of mobiltiy    Time 8   Period Weeks   Status New   PT LONG TERM GOAL #2   Title Patient to be able to peform TUG in 10 seconds or less with no device and no unsteadiness in order to reduce fall risk and  demonstrate improved mobility    Time 8   Period Weeks   Status New   PT LONG TERM GOAL #3   Title Patient to report improved functional performance at home, including being able to mount/dismount his riding lawnmower and work outside, for periods of approximately 30 mintues with minimal unsteadiness or fatigue, in order to assist in improving functional task performance at home    Time 8   Period Weeks   Status New   PT LONG TERM GOAL #4   Title Patient will report no falls within the past 4 weeks and will verbally be able to describe precautions and safety concepts that he can use at home, outside, and in community to assist in preventing falls   Time 8   Period Weeks   Status New   PT LONG TERM GOAL #5   Title Patient to demosntrate low back and bilateral hip ROM to be [facility] in order to assist in improving general mobilty and mechanics, also to reduce pain    Time 8   Period Weeks   Status New               Plan - 01/14/16 1533    Clinical Impression Statement Patient arrives with complaints of chronic LBP and knee pain; he reports that he is just very shaky on his feet, frequent falls  and just has not been able to do a lot. He reports that his feet are cold frequently and that he has a hard time with his balance; upon examination noted multiple wounds with signs of inflamation on his feet, also noted lack of hair to about mid-shin level on both legs, indicating possible vascular issues- spoke to patient about monitoring condition of feet//skin on feet due to possible poor vascularity and diabetes putting him at risk for wounsd or other skin complications especially since he likes to go barefoot. Upon examiantion, note significant gait and postural deviations, significant muscle weakness,  general muscle and joint stiffness, unsteadiness even with wide BOS, reduced funtional activity tolearnce as evidenced by quick fatigue, and reduced overall safety and funtional task performance  skills. At this time recommend skilled PT services to address functional limitations and to assist patiient in reaching optmial level of function.    Pt will benefit from skilled therapeutic intervention in order to improve on the following deficits Abnormal gait;Decreased skin integrity;Hypomobility;Decreased activity tolerance;Decreased strength;Pain;Difficulty walking;Decreased balance;Decreased mobility;Improper body mechanics;Decreased coordination;Impaired flexibility;Postural dysfunction   Rehab Potential Good   PT Frequency 2x / week   PT Duration 8 weeks   PT Treatment/Interventions ADLs/Self Care Home Management;Cryotherapy;Electrical Stimulation;Moist Heat;DME Instruction;Gait training;Stair training;Functional mobility training;Therapeutic activities;Therapeutic exercise;Balance training;Neuromuscular re-education;Patient/family education;Manual techniques;Passive range of motion;Energy conservation;Taping   PT Next Visit Plan review HEP and goals; functional postural training, functional stretching and strength, functional activity tolerance. Monitor feet/lower legs for wound healing status- if note possible healing complications appearing, can contact MD; continue encouraging patient to wear shoes for protection and check feet regularly. Balance training.    PT Home Exercise Plan given    Recommended Other Services possible diabetic shoes    Consulted and Agree with Plan of Care Patient         Problem List Patient Active Problem List   Diagnosis Date Noted  . Left knee pain 12/04/2015  . Alcoholic peripheral neuropathy ([place]) 10/08/2014  . Diabetic neuropathy ([place]) 10/08/2014  . Chest pain, rule out acute myocardial infarction 03/10/2014  . Thrombocytopenia ([place]) 03/10/2014  . ETOH abuse 03/10/2014  . Chest pain 03/10/2014  . Diabetes mellitus without complication ([place])   . Hypertension   . Hypercholesterolemia     Deniece Ree PT, DPT [facility] [address] [place], [place], [address] Phone: [phone]   Fax:  [phone]  Name: LUISENRIQUE SHAM MRN: [id number] Date of Birth: Jun 17, 1966

## 2016-01-14 NOTE — Patient Instructions (Signed)
   BRIDGING  While lying on your back, tighten your lower abdominals, squeeze your buttocks and then raise your buttocks off the floor/bed as creating a "Bridge" with your body.  Repeat 5-10 times, 2-3 times a day.     HIP ABDUCTION - SIDELYING  While lying on your side, slowly raise up your top leg to the side. Keep your knee straight and maintain your toes pointed forward the entire time.   The bottom leg can be bent to stabilize your body.  Repeat 5-10 times, 2-3 times per day.   Tandem Stance    Right foot in front of left, heel touching toe both feet "straight ahead". Stand on Foot Triangle of Support with both feet. Balance in this position _10-15__ seconds at least. Do with left foot in front of right (switching sides). Repeat 3 times each side, 2-3 times per day.   Copyright  VHI. All rights reserved.

## 2016-01-18 ENCOUNTER — Encounter (HOSPITAL_COMMUNITY): Payer: BLUE CROSS/BLUE SHIELD

## 2016-01-21 ENCOUNTER — Ambulatory Visit (HOSPITAL_COMMUNITY): Payer: BLUE CROSS/BLUE SHIELD

## 2016-01-21 DIAGNOSIS — M25562 Pain in left knee: Secondary | ICD-10-CM

## 2016-01-21 DIAGNOSIS — R262 Difficulty in walking, not elsewhere classified: Secondary | ICD-10-CM

## 2016-01-21 DIAGNOSIS — M545 Low back pain: Secondary | ICD-10-CM

## 2016-01-21 DIAGNOSIS — R2681 Unsteadiness on feet: Secondary | ICD-10-CM

## 2016-01-21 DIAGNOSIS — M2560 Stiffness of unspecified joint, not elsewhere classified: Secondary | ICD-10-CM

## 2016-01-21 DIAGNOSIS — M25561 Pain in right knee: Secondary | ICD-10-CM

## 2016-01-21 DIAGNOSIS — M6281 Muscle weakness (generalized): Secondary | ICD-10-CM

## 2016-01-21 NOTE — Therapy (Signed)
[place], [place], [address] Phone: [phone]   Fax:  [phone]  Physical Therapy Treatment  Patient Details  Name: Eugene Diaz MRN: [id number] Date of Birth: 1966-01-08 Referring Provider: Narda Amber   Encounter Date: 01/21/2016      PT End of Session - 01/21/16 1422    Visit Number 2   Number of Visits 16   Date for PT Re-Evaluation 02/11/16   Authorization Type BCBS    Authorization Time Period 01/14/16 to 03/15/16   Authorization - Visit Number 2   Authorization - Number of Visits 30   PT Start Time [id number]   PT Stop Time 1426   PT Time Calculation (min) 38 min   Activity Tolerance Patient tolerated treatment well;Patient limited by fatigue   Behavior During Therapy [facility] for tasks assessed/performed      Past Medical History  Diagnosis Date  . Diabetes mellitus without complication ([place]) 0000000  . Hypertension   . Hypercholesterolemia   . Peripheral vascular disease ([place])   . Shortness of breath   . Asthma     Past Surgical History  Procedure Laterality Date  . Left arm      Left arm repair (tendon and artery)  . Pilonidal cyst excision N/A 08/08/2014    Procedure: CYST EXCISION PILONIDAL EXTENSIVE;  Surgeon: Jamesetta So, MD;  Location: [facility];  Service: General;  Laterality: N/A;    There were no vitals filed for this visit.  Visit Diagnosis:  Midline low back pain, with sciatica presence unspecified  Bilateral knee pain  Joint stiffness of multiple sites  Muscle weakness  Difficulty walking  Unsteadiness      Subjective Assessment - 01/21/16 1354    Subjective Pt reports hes feeling ok, but really not so different. He thinks hes been more active outside with the nicer weather, so hes moving more and with better range, but the pain remains constant. He tried to walkout for a bit started to tremor, unclear if his BG was a problem. Yesterday home nurse asserted BG in high  200's.      Pertinent History HTN, diabetes with neuropathy, hx of chest pain, hx of ETOH abuse, PVD, asthma (carries inhaler), thrombocytopenia    How long can you stand comfortably? 15-20 minutes   How long can you walk comfortably? household distances, maybe 60-73ft    Patient Stated Goals get stronger, be able to stand and move, be able to get outside    Currently in Pain? Yes   Pain Score --  back knees,  and feet; swelling in knees with prolonged activity.                          [facility] Adult PT Treatment/Exercise - 01/21/16 0001    Exercises   Exercises Knee/Hip;Lumbar   Lumbar Exercises: Supine   Bridge 10 reps  2x10    Lumbar Exercises: Sidelying   Hip Abduction 10 reps  2x10   Knee/Hip Exercises: Stretches   Active Hamstring Stretch 3 reps;30 seconds   Other Knee/Hip Stretches adductor stretch   3x30s supine, feet elevatedin cahir with physio ball   Knee/Hip Exercises: Seated   Long Arc Quad 2 sets;10 reps  3#             Balance Exercises - 01/21/16 1414    Balance Exercises: Standing   Standing Eyes Closed 3 reps;30 secs;Narrow base of support (BOS);Wide (BOA)  Tandem Stance Eyes open;3 reps;30 secs           PT Education - 01/21/16 1421    Education provided Yes   Education Details reinforced HEP compliance; explained how DM2 leads to amputation.    Person(s) Educated Patient   Methods Explanation   Comprehension Verbalized understanding          PT Short Term Goals - 01/14/16 1542    PT SHORT TERM GOAL #1   Title Patient will experience pain in his low back and knees no more than 3/10 in order to assist in improving general mobiltiy and ability to participate in functional tasks    Time 4   Period Weeks   Status New   PT SHORT TERM GOAL #2   Title Patient will be able to maintain correct posture wiith equal weight bearing and upright stance at least 75% of the time in order to assist in reducing back pain and to improve  overall mechanics    Time 4   Period Weeks   Status New   PT SHORT TERM GOAL #3   Title Patient to demonstrate improved gait pattern with equal weight bearing, good heel-toe pattern, upright posture, equal step/stance, with LRAD or no device in order to assist in improving general mobiltiy    Time 4   Period Weeks   Status New   PT SHORT TERM GOAL #4   Title Patient to be independent in verbally explaining importance of checking his feet for injury and foreign objects on a daily basis, also to be able to verbally explain the importance of wearing shoes in order to protect feet and prevent development of wounds and infection for good general health status    Time 4   Period Weeks   Status New   PT SHORT TERM GOAL #5   Title Patient will be independent in correctly and consistently performing appropriate HEP, to be updated PRN    Time 4   Period Weeks   Status New           PT Long Term Goals - 01/14/16 1546    PT LONG TERM GOAL #1   Title Patient will demonstrate strength in all tested muscle groups at least 4/5 in order to reduce pain and assist in improving general level of mobiltiy    Time 8   Period Weeks   Status New   PT LONG TERM GOAL #2   Title Patient to be able to peform TUG in 10 seconds or less with no device and no unsteadiness in order to reduce fall risk and demonstrate improved mobility    Time 8   Period Weeks   Status New   PT LONG TERM GOAL #3   Title Patient to report improved functional performance at home, including being able to mount/dismount his riding lawnmower and work outside, for periods of approximately 30 mintues with minimal unsteadiness or fatigue, in order to assist in improving functional task performance at home    Time 8   Period Weeks   Status New   PT LONG TERM GOAL #4   Title Patient will report no falls within the past 4 weeks and will verbally be able to describe precautions and safety concepts that he can use at home, outside, and in  community to assist in preventing falls   Time 8   Period Weeks   Status New   PT LONG TERM GOAL #5   Title Patient to demosntrate low  back and bilateral hip ROM to be [facility] in order to assist in improving general mobilty and mechanics, also to reduce pain    Time 8   Period Weeks   Status New               Plan - 01/21/16 1424    Clinical Impression Statement Pt showing minimal progress, not havign started HEP yet since he is unable to get down to floor at home. Encouraged pt to perform all in bed at home, adn reviewed as such. Pt able to perform all HEP today this session without eexacerbation of pain. Pt acknolwedging of education provided on diabetes and, peripheral neuropathy, and BG.    Pt will benefit from skilled therapeutic intervention in order to improve on the following deficits Abnormal gait;Decreased skin integrity;Hypomobility;Decreased activity tolerance;Decreased strength;Pain;Difficulty walking;Decreased balance;Decreased mobility;Improper body mechanics;Decreased coordination;Impaired flexibility;Postural dysfunction   Rehab Potential Good   PT Frequency 2x / week   PT Duration 8 weeks   PT Treatment/Interventions ADLs/Self Care Home Management;Cryotherapy;Electrical Stimulation;Moist Heat;DME Instruction;Gait training;Stair training;Functional mobility training;Therapeutic activities;Therapeutic exercise;Balance training;Neuromuscular re-education;Patient/family education;Manual techniques;Passive range of motion;Energy conservation;Taping   PT Next Visit Plan Attempt to incorporate knee mobilzation to improve knee pain,a ttempt quadruped activity to improve floor to standing abilities.    PT Home Exercise Plan reviewed    Consulted and Agree with Plan of Care Patient        Problem List Patient Active Problem List   Diagnosis Date Noted  . Left knee pain 12/04/2015  . Alcoholic peripheral neuropathy ([place]) 10/08/2014  . Diabetic neuropathy ([place]) 10/08/2014  .  Chest pain, rule out acute myocardial infarction 03/10/2014  . Thrombocytopenia ([place]) 03/10/2014  . ETOH abuse 03/10/2014  . Chest pain 03/10/2014  . Diabetes mellitus without complication ([place])   . Hypertension   . Hypercholesterolemia    2:31 PM, 01/21/2016 Etta Grandchild, PT, DPT PRN Physical Therapist at [facility] License # [id number] (wireless)  [phone] (mobile)       [place], [place], [address] Phone: [phone]   Fax:  [phone]  Name: Eugene Diaz MRN: [id number] Date of Birth: 08-09-1966

## 2016-01-25 ENCOUNTER — Ambulatory Visit (HOSPITAL_COMMUNITY): Payer: BLUE CROSS/BLUE SHIELD

## 2016-01-25 DIAGNOSIS — M2560 Stiffness of unspecified joint, not elsewhere classified: Secondary | ICD-10-CM

## 2016-01-25 DIAGNOSIS — R2681 Unsteadiness on feet: Secondary | ICD-10-CM

## 2016-01-25 DIAGNOSIS — M25561 Pain in right knee: Secondary | ICD-10-CM

## 2016-01-25 DIAGNOSIS — M545 Low back pain: Secondary | ICD-10-CM | POA: Diagnosis not present

## 2016-01-25 DIAGNOSIS — R262 Difficulty in walking, not elsewhere classified: Secondary | ICD-10-CM

## 2016-01-25 DIAGNOSIS — M6281 Muscle weakness (generalized): Secondary | ICD-10-CM

## 2016-01-25 DIAGNOSIS — M25562 Pain in left knee: Secondary | ICD-10-CM

## 2016-01-25 NOTE — Patient Instructions (Signed)
   Isometric Transverse abdominal contraction  Lay on your back with your knees bent.    Place your thumbs on your stomach just inside your hip bones to feel the muscle contract.  Activate your abdominals by pulling everything in.  "Try to bring your naval to your spine."  Hold this contraction for as long as possible to improve endurance.    Learn to use this muscle with daily activities such as lifting, bending, rolling.    Complete 10 -15 reps, 2-3x/day  DO NOT HOLD BREATH

## 2016-01-25 NOTE — Therapy (Signed)
[place], [place], [address] Phone: [phone]   Fax:  [phone]  Physical Therapy Treatment  Patient Details  Name: MARIANA ARENS MRN: [id number] Date of Birth: 1966-08-24 Referring Provider: Narda Amber   Encounter Date: 01/25/2016      PT End of Session - 01/25/16 1346    Visit Number 3   Number of Visits 16   Date for PT Re-Evaluation 02/11/16   Authorization Type BCBS    Authorization Time Period 01/14/16 to 03/15/16   Authorization - Visit Number 3   Authorization - Number of Visits 30   PT Start Time 1300   PT Stop Time 1345   PT Time Calculation (min) 45 min   Activity Tolerance Patient tolerated treatment well   Behavior During Therapy [facility] for tasks assessed/performed      Past Medical History  Diagnosis Date  . Diabetes mellitus without complication ([place]) 0000000  . Hypertension   . Hypercholesterolemia   . Peripheral vascular disease ([place])   . Shortness of breath   . Asthma     Past Surgical History  Procedure Laterality Date  . Left arm      Left arm repair (tendon and artery)  . Pilonidal cyst excision N/A 08/08/2014    Procedure: CYST EXCISION PILONIDAL EXTENSIVE;  Surgeon: Jamesetta So, MD;  Location: [facility];  Service: General;  Laterality: N/A;    There were no vitals filed for this visit.  Visit Diagnosis:  Midline low back pain, with sciatica presence unspecified  Bilateral knee pain  Joint stiffness of multiple sites  Muscle weakness  Difficulty walking  Unsteadiness      Subjective Assessment - 01/25/16 1302    Subjective Pt reported that his B feet/knees and low back were hurting this past weekend. Pt admitted minor compliance with his HEP and noted that the prescribed bridge exercise was causing his LBP to increase, which he has discontinued.    Pertinent History HTN, diabetes with neuropathy, hx of chest pain, hx of ETOH abuse, PVD, asthma (carries inhaler),  thrombocytopenia    Patient Stated Goals get stronger, be able to stand and move, be able to get outside    Currently in Pain? Yes   Pain Score 5    Pain Location Back   Pain Orientation Mid;Left   Pain Descriptors / Indicators Shooting;Stabbing   Pain Type Chronic pain   Pain Onset More than a month ago   Pain Frequency Constant   Pain Relieving Factors changing position and short distance ambulation    Pain Score 5   Pain Location Knee   Pain Orientation Right;Left   Pain Descriptors / Indicators Discomfort;Sore   Pain Type Chronic pain   Pain Onset More than a month ago   Pain Frequency Constant   Aggravating Factors  WB activities and long distance ambulation    Pain Relieving Factors Resting    Effect of Pain on Daily Activities Limits his ability to ambulate long distance ambulated                         [facility] Adult PT Treatment/Exercise - 01/25/16 0001    Lumbar Exercises: Supine   Ab Set 3 seconds;15 reps   AB Set Limitations verbal cues for proper core activation and to avoid holding breath    Clam 15 reps   Clam Limitations red thera-band    Bridge 5 reps;Other (comment)  increased  LBP reported; bridges were discontinued    Knee/Hip Exercises: Stretches   Passive Hamstring Stretch Both;3 reps;30 seconds   Passive Hamstring Stretch Limitations supine    Piriformis Stretch Both;3 reps;30 seconds   Piriformis Stretch Limitations manual   Other Knee/Hip Stretches adductor stretch in supine 30 sec x 3 sets; passively.   Knee/Hip Exercises: Seated   Long Arc Quad 2 sets;10 reps  3#   Hamstring Curl Strengthening;2 sets;10 reps;Both   Hamstring Limitations red thera-band    Manual Therapy   Manual Therapy Passive ROM   Manual therapy comments completed at the beginning of PT tx prior to ther ex    Passive ROM B hip/ knee PROM in all directions x 2 minutes, bilaterally                 PT Education - 01/25/16 1356    Education provided Yes    Education Details current and updated HEP and core bracing with functional mobility activities   Person(s) Educated Patient   Methods Explanation;Demonstration;Handout   Comprehension Verbalized understanding;Returned demonstration          PT Short Term Goals - 01/14/16 1542    PT SHORT TERM GOAL #1   Title Patient will experience pain in his low back and knees no more than 3/10 in order to assist in improving general mobiltiy and ability to participate in functional tasks    Time 4   Period Weeks   Status New   PT SHORT TERM GOAL #2   Title Patient will be able to maintain correct posture wiith equal weight bearing and upright stance at least 75% of the time in order to assist in reducing back pain and to improve overall mechanics    Time 4   Period Weeks   Status New   PT SHORT TERM GOAL #3   Title Patient to demonstrate improved gait pattern with equal weight bearing, good heel-toe pattern, upright posture, equal step/stance, with LRAD or no device in order to assist in improving general mobiltiy    Time 4   Period Weeks   Status New   PT SHORT TERM GOAL #4   Title Patient to be independent in verbally explaining importance of checking his feet for injury and foreign objects on a daily basis, also to be able to verbally explain the importance of wearing shoes in order to protect feet and prevent development of wounds and infection for good general health status    Time 4   Period Weeks   Status New   PT SHORT TERM GOAL #5   Title Patient will be independent in correctly and consistently performing appropriate HEP, to be updated PRN    Time 4   Period Weeks   Status New           PT Long Term Goals - 01/14/16 1546    PT LONG TERM GOAL #1   Title Patient will demonstrate strength in all tested muscle groups at least 4/5 in order to reduce pain and assist in improving general level of mobiltiy    Time 8   Period Weeks   Status New   PT LONG TERM GOAL #2   Title  Patient to be able to peform TUG in 10 seconds or less with no device and no unsteadiness in order to reduce fall risk and demonstrate improved mobility    Time 8   Period Weeks   Status New   PT LONG TERM GOAL #3  Title Patient to report improved functional performance at home, including being able to mount/dismount his riding lawnmower and work outside, for periods of approximately 30 mintues with minimal unsteadiness or fatigue, in order to assist in improving functional task performance at home    Time 8   Period Weeks   Status New   PT LONG TERM GOAL #4   Title Patient will report no falls within the past 4 weeks and will verbally be able to describe precautions and safety concepts that he can use at home, outside, and in community to assist in preventing falls   Time 8   Period Weeks   Status New   PT LONG TERM GOAL #5   Title Patient to demosntrate low back and bilateral hip ROM to be [facility] in order to assist in improving general mobilty and mechanics, also to reduce pain    Time 8   Period Weeks   Status New               Plan - 01/25/16 1336    Clinical Impression Statement PT tx was focused on B hip/knee PROM, LE stretches, B hip/knee and core strengthening ther ex, and pt education regarding HEP and core bracing with functional mobility activities. Initiated PT tx with B hip/knee PROM in all directions and LE stretches in order to improve LE mobility with functional mobility activities. Added piriformis stretch to ther ex regimen. Pt presents with significant tightness of hip ER and IR. Further added supine hip ER with thera-band and seated hamstring curls with red thera-band. Good tolerance reported with added ther ex, but pt required brief rest breaks between sets secondary to muscular fatigue. Added ab sets to ther ex regimen and HEP with fair understanding demo and verbalized with teach back method. Updated HEP handout was provided this visit. Instructed the pt to  discontinue supine bridges secondary to increased c/o LBP. Pt was in agreement. Fair to good tolerance reported with today's PT tx with pain levels remaining at baseline levels at the end of PT tx. Continue with current POC with focus on B LE and core strengthening, and LE stretching in order to improve mobility and functional strength with functional mobility activities.    Pt will benefit from skilled therapeutic intervention in order to improve on the following deficits Abnormal gait;Decreased skin integrity;Hypomobility;Decreased activity tolerance;Decreased strength;Pain;Difficulty walking;Decreased balance;Decreased mobility;Improper body mechanics;Decreased coordination;Impaired flexibility;Postural dysfunction   Rehab Potential Good   PT Frequency 2x / week   PT Duration 8 weeks   PT Treatment/Interventions ADLs/Self Care Home Management;Cryotherapy;Electrical Stimulation;Moist Heat;DME Instruction;Gait training;Stair training;Functional mobility training;Therapeutic activities;Therapeutic exercise;Balance training;Neuromuscular re-education;Patient/family education;Manual techniques;Passive range of motion;Energy conservation;Taping   PT Next Visit Plan Attempt to incorporate knee mobilzation to improve knee pain,a ttempt quadruped activity to improve floor to standing abilities.    PT Home Exercise Plan reviewed and updated HEP with addition of ab set/Transversus abdominis activation in hooklying position; bridges were discontinued secondary to pt c/o increased LBP   Consulted and Agree with Plan of Care Patient        Problem List Patient Active Problem List   Diagnosis Date Noted  . Left knee pain 12/04/2015  . Alcoholic peripheral neuropathy ([place]) 10/08/2014  . Diabetic neuropathy ([place]) 10/08/2014  . Chest pain, rule out acute myocardial infarction 03/10/2014  . Thrombocytopenia ([place]) 03/10/2014  . ETOH abuse 03/10/2014  . Chest pain 03/10/2014  . Diabetes mellitus without  complication ([place])   . Hypertension   . Hypercholesterolemia  Garen Lah, PT, DPT   01/25/2016, 2:05 PM  [place], [place], [address] Phone: [phone]   Fax:  [phone]  Name: ADHAM BORREGO MRN: [id number] Date of Birth: 03/05/1966

## 2016-01-28 ENCOUNTER — Encounter (HOSPITAL_COMMUNITY): Payer: BLUE CROSS/BLUE SHIELD

## 2016-02-01 ENCOUNTER — Ambulatory Visit (HOSPITAL_COMMUNITY): Payer: BLUE CROSS/BLUE SHIELD | Attending: Neurology

## 2016-02-01 DIAGNOSIS — M25562 Pain in left knee: Secondary | ICD-10-CM | POA: Diagnosis present

## 2016-02-01 DIAGNOSIS — M2560 Stiffness of unspecified joint, not elsewhere classified: Secondary | ICD-10-CM | POA: Insufficient documentation

## 2016-02-01 DIAGNOSIS — R2681 Unsteadiness on feet: Secondary | ICD-10-CM | POA: Diagnosis present

## 2016-02-01 DIAGNOSIS — R262 Difficulty in walking, not elsewhere classified: Secondary | ICD-10-CM | POA: Insufficient documentation

## 2016-02-01 DIAGNOSIS — M545 Low back pain: Secondary | ICD-10-CM | POA: Insufficient documentation

## 2016-02-01 DIAGNOSIS — M6281 Muscle weakness (generalized): Secondary | ICD-10-CM | POA: Insufficient documentation

## 2016-02-01 DIAGNOSIS — M25662 Stiffness of left knee, not elsewhere classified: Secondary | ICD-10-CM | POA: Diagnosis present

## 2016-02-01 DIAGNOSIS — M25652 Stiffness of left hip, not elsewhere classified: Secondary | ICD-10-CM | POA: Insufficient documentation

## 2016-02-01 DIAGNOSIS — M25661 Stiffness of right knee, not elsewhere classified: Secondary | ICD-10-CM | POA: Insufficient documentation

## 2016-02-01 DIAGNOSIS — M25651 Stiffness of right hip, not elsewhere classified: Secondary | ICD-10-CM | POA: Diagnosis present

## 2016-02-01 DIAGNOSIS — M25561 Pain in right knee: Secondary | ICD-10-CM | POA: Insufficient documentation

## 2016-02-01 NOTE — Therapy (Signed)
[place], [place], [address] Phone: [phone]   Fax:  [phone]  Physical Therapy Treatment  Patient Details  Name: Eugene Diaz MRN: [id number] Date of Birth: Feb 10, 1966 Referring Provider: Narda Amber   Encounter Date: 02/01/2016      PT End of Session - 02/01/16 1323    Visit Number 4   Number of Visits 16   Date for PT Re-Evaluation 02/11/16   Authorization Type BCBS    Authorization Time Period 01/14/16 to 03/15/16   Authorization - Visit Number 4   Authorization - Number of Visits 30   PT Start Time 1301   PT Stop Time 1350   PT Time Calculation (min) 49 min   Activity Tolerance Patient tolerated treatment well   Behavior During Therapy [facility] for tasks assessed/performed      Past Medical History  Diagnosis Date  . Diabetes mellitus without complication ([place]) 0000000  . Hypertension   . Hypercholesterolemia   . Peripheral vascular disease ([place])   . Shortness of breath   . Asthma     Past Surgical History  Procedure Laterality Date  . Left arm      Left arm repair (tendon and artery)  . Pilonidal cyst excision N/A 08/08/2014    Procedure: CYST EXCISION PILONIDAL EXTENSIVE;  Surgeon: Jamesetta So, MD;  Location: [facility];  Service: General;  Laterality: N/A;    There were no vitals filed for this visit.  Visit Diagnosis:  Midline low back pain, with sciatica presence unspecified  Bilateral knee pain  Joint stiffness of multiple sites  Muscle weakness  Difficulty walking  Unsteadiness      Subjective Assessment - 02/01/16 1304    Subjective Pt reported that his "legs are sore and tired" today that he attributed to taking care of his grandchild yesterday. LBP and B knee were rated a 5/10 on a VAS upon arrival.    Pertinent History HTN, diabetes with neuropathy, hx of chest pain, hx of ETOH abuse, PVD, asthma (carries inhaler), thrombocytopenia    Patient Stated Goals get stronger,  be able to stand and move, be able to get outside    Currently in Pain? Yes   Pain Score 5    Pain Location Back   Pain Orientation Lower;Medial   Pain Descriptors / Indicators Aching   Pain Type Chronic pain   Pain Onset More than a month ago   Pain Frequency Constant   Aggravating Factors  walking long distances, bending forward, and cold weather    Pain Relieving Factors sitting, changing position, back brace, and short distance ambulation    Pain Score 5   Pain Location Knee   Pain Orientation Right;Left   Pain Descriptors / Indicators Sore;Discomfort   Pain Type Chronic pain   Pain Onset More than a month ago   Pain Frequency Constant   Aggravating Factors  WB activities    Pain Relieving Factors sitting    Effect of Pain on Daily Activities Limits his ability to ambulate long distances                          [facility] Adult PT Treatment/Exercise - 02/01/16 0001    Lumbar Exercises: Supine   Ab Set 3 seconds;15 reps   AB Set Limitations verbal cues for proper core activation and to avoid holding breath    Clam --   Clam Limitations Reviewed  Bridge Limitations Discontinued   Knee/Hip Exercises: Hydrologist Both;3 reps;30 seconds   Passive Hamstring Stretch Limitations supine    Piriformis Stretch Both;3 reps;30 seconds   Piriformis Stretch Limitations manual   Other Knee/Hip Stretches adductor stretch in supine 30 sec x 3 sets; passively.   Knee/Hip Exercises: Seated   Long Arc Quad 10 reps;1 set   Hamstring Curl Strengthening;10 reps;Both;1 set   Hamstring Limitations red thera-band    Knee/Hip Exercises: Supine   Straight Leg Raises Strengthening;Both;2 sets;10 reps   Knee/Hip Exercises: Sidelying   Hip ABduction Strengthening;Both;2 sets;10 reps   Manual Therapy   Manual Therapy Passive ROM;Joint mobilization   Manual therapy comments completed at the beginning of PT tx prior to ther ex    Joint Mobilization bilateral  tibiofemoral distraction in short sit position with knee at 90 degrees flexion  in order to reduce knee pain   Passive ROM B hip/ knee PROM in all directions x 2 minutes, bilaterally                 PT Education - 02/01/16 1322    Education provided Yes   Education Details Current HEP and core bracing with functional mobility activities    Person(s) Educated Patient   Methods Explanation;Demonstration   Comprehension Verbalized understanding;Returned demonstration          PT Short Term Goals - 01/14/16 1542    PT SHORT TERM GOAL #1   Title Patient will experience pain in his low back and knees no more than 3/10 in order to assist in improving general mobiltiy and ability to participate in functional tasks    Time 4   Period Weeks   Status New   PT SHORT TERM GOAL #2   Title Patient will be able to maintain correct posture wiith equal weight bearing and upright stance at least 75% of the time in order to assist in reducing back pain and to improve overall mechanics    Time 4   Period Weeks   Status New   PT SHORT TERM GOAL #3   Title Patient to demonstrate improved gait pattern with equal weight bearing, good heel-toe pattern, upright posture, equal step/stance, with LRAD or no device in order to assist in improving general mobiltiy    Time 4   Period Weeks   Status New   PT SHORT TERM GOAL #4   Title Patient to be independent in verbally explaining importance of checking his feet for injury and foreign objects on a daily basis, also to be able to verbally explain the importance of wearing shoes in order to protect feet and prevent development of wounds and infection for good general health status    Time 4   Period Weeks   Status New   PT SHORT TERM GOAL #5   Title Patient will be independent in correctly and consistently performing appropriate HEP, to be updated PRN    Time 4   Period Weeks   Status New           PT Long Term Goals - 01/14/16 1546    PT  LONG TERM GOAL #1   Title Patient will demonstrate strength in all tested muscle groups at least 4/5 in order to reduce pain and assist in improving general level of mobiltiy    Time 8   Period Weeks   Status New   PT LONG TERM GOAL #2   Title Patient to be able to  peform TUG in 10 seconds or less with no device and no unsteadiness in order to reduce fall risk and demonstrate improved mobility    Time 8   Period Weeks   Status New   PT LONG TERM GOAL #3   Title Patient to report improved functional performance at home, including being able to mount/dismount his riding lawnmower and work outside, for periods of approximately 30 mintues with minimal unsteadiness or fatigue, in order to assist in improving functional task performance at home    Time 8   Period Weeks   Status New   PT LONG TERM GOAL #4   Title Patient will report no falls within the past 4 weeks and will verbally be able to describe precautions and safety concepts that he can use at home, outside, and in community to assist in preventing falls   Time 8   Period Weeks   Status New   PT LONG TERM GOAL #5   Title Patient to demosntrate low back and bilateral hip ROM to be [facility] in order to assist in improving general mobilty and mechanics, also to reduce pain    Time 8   Period Weeks   Status New               Plan - 02/01/16 1323    Clinical Impression Statement PT tx was focused on B hip/knee PROM, LE stretches, B hip/knee and core strengthening ther ex, and pt education regarding HEP and core bracing with functional mobility activities. Initiated PT tx with B hip/knee PROM in all directions and LE stretches in order to improve LE mobility with functional mobility activities. Added supine and S/L SLR to ther ex regimen with fair tolerance reported.  Pt was limited by muscular fatigue with rest breaks required between sets.  Reviewed and completed ab sets in supine with improved understanding and technique demo compared  to last PT visit. Added bilateral tibiofemoral distraction in short sit position in order to reduce B knee pain. Pt responded well to added manual techniques with no adverse effects reported. B knee and low back pain remained at baseline level at the end of PT tx. Pt continues to present with strength, flexibility, and mobility deficits of B LE and lumbar spine. Continue with current POC with focus on B LE and core strengthening, and LE stretching in order to improve mobility and functional strength with functional mobility activities.     Pt will benefit from skilled therapeutic intervention in order to improve on the following deficits Abnormal gait;Decreased skin integrity;Hypomobility;Decreased activity tolerance;Decreased strength;Pain;Difficulty walking;Decreased balance;Decreased mobility;Improper body mechanics;Decreased coordination;Impaired flexibility;Postural dysfunction   Rehab Potential Good   PT Frequency 2x / week   PT Duration 8 weeks   PT Treatment/Interventions ADLs/Self Care Home Management;Cryotherapy;Electrical Stimulation;Moist Heat;DME Instruction;Gait training;Stair training;Functional mobility training;Therapeutic activities;Therapeutic exercise;Balance training;Neuromuscular re-education;Patient/family education;Manual techniques;Passive range of motion;Energy conservation;Taping   PT Next Visit Plan Next visit to focus on SLR into flexion and abduction, core stabilization in seated and supine position, and manual therapy techniques to reduce LBP and B knee pain   PT Home Exercise Plan Reviewed HEP with no changes made this visit; bridges were discontinued secondary to pt c/o increased LBP   Consulted and Agree with Plan of Care Patient        Problem List Patient Active Problem List   Diagnosis Date Noted  . Left knee pain 12/04/2015  . Alcoholic peripheral neuropathy ([place]) 10/08/2014  . Diabetic neuropathy ([place]) 10/08/2014  . Chest pain,  rule out acute myocardial  infarction 03/10/2014  . Thrombocytopenia ([place]) 03/10/2014  . ETOH abuse 03/10/2014  . Chest pain 03/10/2014  . Diabetes mellitus without complication ([place])   . Hypertension   . Hypercholesterolemia     Garen Lah, PT, DPT   02/01/2016, 2:22 PM  [place], [place], [address] Phone: [phone]   Fax:  [phone]  Name: Eugene Diaz MRN: [id number] Date of Birth: 09-Jan-1966

## 2016-02-04 ENCOUNTER — Ambulatory Visit (HOSPITAL_COMMUNITY): Payer: BLUE CROSS/BLUE SHIELD

## 2016-02-04 DIAGNOSIS — M6281 Muscle weakness (generalized): Secondary | ICD-10-CM

## 2016-02-04 DIAGNOSIS — M545 Low back pain: Secondary | ICD-10-CM

## 2016-02-04 DIAGNOSIS — R262 Difficulty in walking, not elsewhere classified: Secondary | ICD-10-CM

## 2016-02-04 DIAGNOSIS — M25651 Stiffness of right hip, not elsewhere classified: Secondary | ICD-10-CM

## 2016-02-04 DIAGNOSIS — R2681 Unsteadiness on feet: Secondary | ICD-10-CM

## 2016-02-04 DIAGNOSIS — M25561 Pain in right knee: Secondary | ICD-10-CM

## 2016-02-04 DIAGNOSIS — M25662 Stiffness of left knee, not elsewhere classified: Secondary | ICD-10-CM

## 2016-02-04 DIAGNOSIS — M25661 Stiffness of right knee, not elsewhere classified: Secondary | ICD-10-CM

## 2016-02-04 DIAGNOSIS — M25562 Pain in left knee: Secondary | ICD-10-CM

## 2016-02-04 DIAGNOSIS — M25652 Stiffness of left hip, not elsewhere classified: Secondary | ICD-10-CM

## 2016-02-04 NOTE — Therapy (Signed)
[place], [place], [address] Phone: [phone]   Fax:  [phone]  Physical Therapy Treatment  Patient Details  Name: Eugene Diaz MRN: [id number] Date of Birth: 1965-11-20 Referring Provider: Narda Amber   Encounter Date: 02/04/2016      PT End of Session - 02/04/16 1305    Visit Number 5   Number of Visits 16   Date for PT Re-Evaluation 02/11/16   Authorization Type BCBS    Authorization Time Period 01/14/16 to 03/15/16   Authorization - Visit Number 5   Authorization - Number of Visits 30   PT Start Time [id number]   PT Stop Time 1346   PT Time Calculation (min) 43 min   Activity Tolerance Patient tolerated treatment well;Patient limited by pain   Behavior During Therapy [facility] for tasks assessed/performed      Past Medical History  Diagnosis Date  . Diabetes mellitus without complication ([place]) 0000000  . Hypertension   . Hypercholesterolemia   . Peripheral vascular disease ([place])   . Shortness of breath   . Asthma     Past Surgical History  Procedure Laterality Date  . Left arm      Left arm repair (tendon and artery)  . Pilonidal cyst excision N/A 08/08/2014    Procedure: CYST EXCISION PILONIDAL EXTENSIVE;  Surgeon: Jamesetta So, MD;  Location: [facility];  Service: General;  Laterality: N/A;    There were no vitals filed for this visit.  Visit Diagnosis:  Midline low back pain, with sciatica presence unspecified  Pain in right knee  Pain in left knee  Muscle weakness (generalized)  Difficulty in walking, not elsewhere classified  Unsteadiness on feet  Stiffness of right hip, not elsewhere classified  Stiffness of left hip, not elsewhere classified  Stiffness of left knee, not elsewhere classified  Stiffness of right knee, not elsewhere classified      Subjective Assessment - 02/04/16 1305    Subjective Pt stated he was really sore today, pain scale 7-8/10 lower back, Bil knees and  feet with diabetic neuropathy.  Reports no falls in the last couple weeks   Pertinent History HTN, diabetes with neuropathy, hx of chest pain, hx of ETOH abuse, PVD, asthma (carries inhaler), thrombocytopenia    Patient Stated Goals get stronger, be able to stand and move, be able to get outside    Currently in Pain? Yes   Pain Score 8    Pain Location Back   Pain Orientation Lower;Medial   Pain Descriptors / Indicators Sore   Pain Type Chronic pain   Pain Radiating Towards runs into hips   Pain Onset More than a month ago   Pain Frequency Constant   Aggravating Factors  walking long distances, bending forward, and cold weather    Pain Relieving Factors sitting, changing position, back brace, and short distance ambulation   Effect of Pain on Daily Activities very weak, has a hard time performing physical tasks    Pain Score 8   Pain Location Knee   Pain Orientation Right;Left   Pain Descriptors / Indicators Sore   Pain Type Chronic pain   Pain Radiating Towards none   Pain Onset More than a month ago   Pain Frequency Constant   Aggravating Factors  WB activities   Pain Relieving Factors sitting   Effect of Pain on Daily Activities Limits his ability to ambulate long distances  [place] [facility] PT Treatment/Exercise - 02/04/16 0001    Lumbar Exercises: Supine   Ab Set 10 reps;3 seconds   AB Set Limitations verbal cues for proper core activation and to avoid holding breath    Straight Leg Raise 10 reps   Straight Leg Raises Limitations BUE pressdown for core activation   Knee/Hip Exercises: Stretches   Active Hamstring Stretch 3 reps;30 seconds   Active Hamstring Stretch Limitations long sitting   Piriformis Stretch Both;3 reps;30 seconds   Piriformis Stretch Limitations manual   Knee/Hip Exercises: Seated   Other Seated Knee/Hip Exercises Diaphramatic breathing techniques to reduce muscle guarding; educated importance of posture; cervical and  scapular retraction x 10                PT Education - 02/04/16 1327    Education provided Yes   Education Details Pt educated on importance of proer posture to reduce back and knee pain   Person(s) Educated Patient;Spouse   Methods Explanation;Demonstration;Verbal cues   Comprehension Verbalized understanding;Returned demonstration;Need further instruction          PT Short Term Goals - 01/14/16 1542    PT SHORT TERM GOAL #1   Title Patient will experience pain in his low back and knees no more than 3/10 in order to assist in improving general mobiltiy and ability to participate in functional tasks    Time 4   Period Weeks   Status New   PT SHORT TERM GOAL #2   Title Patient will be able to maintain correct posture wiith equal weight bearing and upright stance at least 75% of the time in order to assist in reducing back pain and to improve overall mechanics    Time 4   Period Weeks   Status New   PT SHORT TERM GOAL #3   Title Patient to demonstrate improved gait pattern with equal weight bearing, good heel-toe pattern, upright posture, equal step/stance, with LRAD or no device in order to assist in improving general mobiltiy    Time 4   Period Weeks   Status New   PT SHORT TERM GOAL #4   Title Patient to be independent in verbally explaining importance of checking his feet for injury and foreign objects on a daily basis, also to be able to verbally explain the importance of wearing shoes in order to protect feet and prevent development of wounds and infection for good general health status    Time 4   Period Weeks   Status New   PT SHORT TERM GOAL #5   Title Patient will be independent in correctly and consistently performing appropriate HEP, to be updated PRN    Time 4   Period Weeks   Status New           PT Long Term Goals - 01/14/16 1546    PT LONG TERM GOAL #1   Title Patient will demonstrate strength in all tested muscle groups at least 4/5 in order to  reduce pain and assist in improving general level of mobiltiy    Time 8   Period Weeks   Status New   PT LONG TERM GOAL #2   Title Patient to be able to peform TUG in 10 seconds or less with no device and no unsteadiness in order to reduce fall risk and demonstrate improved mobility    Time 8   Period Weeks   Status New   PT LONG TERM GOAL #3   Title Patient to report  improved functional performance at home, including being able to mount/dismount his riding lawnmower and work outside, for periods of approximately 30 mintues with minimal unsteadiness or fatigue, in order to assist in improving functional task performance at home    Time 8   Period Weeks   Status New   PT LONG TERM GOAL #4   Title Patient will report no falls within the past 4 weeks and will verbally be able to describe precautions and safety concepts that he can use at home, outside, and in community to assist in preventing falls   Time 8   Period Weeks   Status New   PT LONG TERM GOAL #5   Title Patient to demosntrate low back and bilateral hip ROM to be [facility] in order to assist in improving general mobilty and mechanics, also to reduce pain    Time 8   Period Weeks   Status New               Plan - 02/04/16 1352    Clinical Impression Statement Pt entered dept limited by high levels of pain with lower back, LE and feet today.  Pt demonstrated increasead muscle guarding with kyphotic posture in seated position.  Began session with diaphragmatic breathing techniques to reduce muscle guarding with high pain scale.  Pt educated on importance of proper posture to reduce stress on spinal musculaure, added seated postural strengthening exercises.  Pt educated on proper bed mobility exercises to reduce stress on lower back with sit <>supine with minimal cueiing for proper form.  Continued manual and active LE stretches to improve mobility with noted hip restrictions and core stability exercises with cueing to improve TrA  activation and continue breathing.  Pt reports decreased tolerance for supine position.  No reports of increased or decreased pain through session.     Pt will benefit from skilled therapeutic intervention in order to improve on the following deficits Abnormal gait;Decreased skin integrity;Hypomobility;Decreased activity tolerance;Decreased strength;Pain;Difficulty walking;Decreased balance;Decreased mobility;Improper body mechanics;Decreased coordination;Impaired flexibility;Postural dysfunction   Rehab Potential Good   PT Frequency 2x / week   PT Duration 8 weeks   PT Treatment/Interventions ADLs/Self Care Home Management;Cryotherapy;Electrical Stimulation;Moist Heat;DME Instruction;Gait training;Stair training;Functional mobility training;Therapeutic activities;Therapeutic exercise;Balance training;Neuromuscular re-education;Patient/family education;Manual techniques;Passive range of motion;Energy conservation;Taping   PT Next Visit Plan Next visit to focus on SLR into flexion and abduction, core stabilization in seated and supine position, and manual therapy techniques to reduce LBP and B knee pain.  Pt may benefit from decompression exercises to improve tolerance for supine and core strengthening if continues to have high pain levels next session.     PT Home Exercise Plan Reviewed HEP with no changes made this visit; bridges were discontinued secondary to pt c/o increased LBP        Problem List Patient Active Problem List   Diagnosis Date Noted  . Left knee pain 12/04/2015  . Alcoholic peripheral neuropathy ([place]) 10/08/2014  . Diabetic neuropathy ([place]) 10/08/2014  . Chest pain, rule out acute myocardial infarction 03/10/2014  . Thrombocytopenia ([place]) 03/10/2014  . ETOH abuse 03/10/2014  . Chest pain 03/10/2014  . Diabetes mellitus without complication ([place])   . Hypertension   . Hypercholesterolemia    Ihor Austin, LPTA; [place]  Aldona Lento 02/04/2016, 3:18  PM  [place], [place], [address] Phone: [phone]   Fax:  [phone]  Name: Eugene Diaz MRN: [id number] Date of Birth: February 22, 1966

## 2016-02-08 ENCOUNTER — Encounter (HOSPITAL_COMMUNITY): Payer: BLUE CROSS/BLUE SHIELD

## 2016-02-11 ENCOUNTER — Ambulatory Visit (HOSPITAL_COMMUNITY): Payer: BLUE CROSS/BLUE SHIELD | Admitting: Physical Therapy

## 2016-02-15 ENCOUNTER — Ambulatory Visit (HOSPITAL_COMMUNITY): Payer: BLUE CROSS/BLUE SHIELD | Admitting: Physical Therapy

## 2016-02-15 DIAGNOSIS — M25561 Pain in right knee: Secondary | ICD-10-CM

## 2016-02-15 DIAGNOSIS — M25562 Pain in left knee: Secondary | ICD-10-CM

## 2016-02-15 DIAGNOSIS — R262 Difficulty in walking, not elsewhere classified: Secondary | ICD-10-CM

## 2016-02-15 DIAGNOSIS — M545 Low back pain: Secondary | ICD-10-CM

## 2016-02-15 DIAGNOSIS — M6281 Muscle weakness (generalized): Secondary | ICD-10-CM

## 2016-02-15 DIAGNOSIS — R2681 Unsteadiness on feet: Secondary | ICD-10-CM

## 2016-02-15 NOTE — Therapy (Signed)
[place], [place], [address] Phone: [phone]   Fax:  [phone]  Physical Therapy Treatment (Re-Assessment)  Patient Details  Name: Eugene Diaz MRN: [id number] Date of Birth: 1966-04-18 Referring Provider: Narda Amber   Encounter Date: 02/15/2016      PT End of Session - 02/15/16 1531    Visit Number 6   Number of Visits 16   Date for PT Re-Evaluation 03/14/16   Authorization Type BCBS    Authorization Time Period 01/14/16 to 03/15/16   Authorization - Visit Number 6   Authorization - Number of Visits 30   PT Start Time 6759   PT Stop Time 1638   PT Time Calculation (min) 42 min   Activity Tolerance Patient tolerated treatment well;Patient limited by pain   Behavior During Therapy [facility] for tasks assessed/performed;Anxious      Past Medical History  Diagnosis Date  . Diabetes mellitus without complication ([place]) 01/6658  . Hypertension   . Hypercholesterolemia   . Peripheral vascular disease ([place])   . Shortness of breath   . Asthma     Past Surgical History  Procedure Laterality Date  . Left arm      Left arm repair (tendon and artery)  . Pilonidal cyst excision N/A 08/08/2014    Procedure: CYST EXCISION PILONIDAL EXTENSIVE;  Surgeon: Jamesetta So, MD;  Location: [facility];  Service: General;  Laterality: N/A;    There were no vitals filed for this visit.      Subjective Assessment - 02/15/16 1351    Subjective Patient reports that he has just been sore recently; his feet have been bothering him a lot more. His wife reports that he just has not been doing very well. Wife reports that she has not noticed any changes with him functionally- "his legs are too weak to hold his body, his legs are gone". Pain has gotten worse, however patient cannot think of anything that has aggravated it specifically.    Pertinent History HTN, diabetes with neuropathy, hx of chest pain, hx of ETOH abuse, PVD, asthma  (carries inhaler), thrombocytopenia    How long can you stand comfortably? 4/17- 15 minutes, give or take    How long can you walk comfortably? 4/17- not far at all, household distances   Patient Stated Goals get stronger, be able to stand and move, be able to get outside    Currently in Pain? Yes   Pain Score 9    Pain Location Back  and down bilateral legs    Pain Orientation Lower   Pain Descriptors / Indicators Sore;Numbness;Tingling   Pain Type Chronic pain   Pain Radiating Towards running down both legs    Pain Onset More than a month ago   Pain Frequency Constant   Aggravating Factors  walking long distances, bending forward, cold weather, being active    Pain Relieving Factors sitting, changing position, back brace    Effect of Pain on Daily Activities very weakn, has a hard time with physical tasks             [facility] PT Assessment - 02/15/16 0001    AROM   Right Hip External Rotation  --  moderate-severe tightness    Right Hip Internal Rotation  --  severe tightness    Left Hip External Rotation  --  moderate tightness    Left Hip Internal Rotation  --  severe tightness    Lumbar Flexion 40  Lumbar Extension 13   Lumbar - Right Side Bend just past midline of jonit    Lumbar - Left Side Bend just past midline of joint   Strength   Right Hip Flexion 3/5   Right Hip Extension 2+/5   Right Hip ABduction 2+/5   Left Hip Flexion 3/5   Left Hip Extension 2+/5   Left Hip ABduction 2+/5   Right Knee Flexion 3-/5   Right Knee Extension 3/5   Left Knee Flexion 3-/5   Left Knee Extension 3-/5   Right Ankle Dorsiflexion 3/5   Left Ankle Dorsiflexion 3/5   Timed Up and Go Test   Normal TUG (seconds) 17.78   TUG Comments cane                              PT Education - 02/15/16 1530    Education provided Yes   Education Details progress with skilled PT services, possible benefits fo skilled HHPT services given situation    Person(s) Educated  Patient;Spouse   Methods Explanation   Comprehension Verbalized understanding          PT Short Term Goals - 02/15/16 1409    PT SHORT TERM GOAL #1   Title Patient will experience pain in his low back and knees no more than 3/10 in order to assist in improving general mobiltiy and ability to participate in functional tasks    Baseline 4/17- pain 9/10 today; on average 9/10; at best 5-6/10   Time 4   Period Weeks   Status On-going   PT SHORT TERM GOAL #2   Title Patient will be able to maintain correct posture wiith equal weight bearing and upright stance at least 75% of the time in order to assist in reducing back pain and to improve overall mechanics    Baseline 4/17- forward flexed posture    Time 4   Period Weeks   Status Achieved   PT SHORT TERM GOAL #3   Title Patient to demonstrate improved gait pattern with equal weight bearing, good heel-toe pattern, upright posture, equal step/stance, with LRAD or no device in order to assist in improving general mobiltiy    Time 4   Period Weeks   Status On-going   PT SHORT TERM GOAL #4   Title Patient to be independent in verbally explaining importance of checking his feet for injury and foreign objects on a daily basis, also to be able to verbally explain the importance of wearing shoes in order to protect feet and prevent development of wounds and infection for good general health status    Baseline 4/17- arrived wearing shoes today; wife reports that patient often scratches himself outside and doesn't realize it; wife voices awareness of importance of this goal and stays on top of patient    Time 4   Period Weeks   Status Partially Met   PT SHORT TERM GOAL #5   Title Patient will be independent in correctly and consistently performing appropriate HEP, to be updated PRN    Baseline 4/17- not very good compliance with HEP, reports he has not been doing PT prescribed HEP    Time 4   Period Weeks   Status On-going           PT  Long Term Goals - 02/15/16 1414    PT LONG TERM GOAL #1   Title Patient will demonstrate strength in all tested muscle groups at  least 4/5 in order to reduce pain and assist in improving general level of mobiltiy    Time 8   Period Weeks   Status On-going   PT LONG TERM GOAL #2   Title Patient to be able to peform TUG in 10 seconds or less with no device and no unsteadiness in order to reduce fall risk and demonstrate improved mobility    Baseline 4/17- 17.78 with cane    PT LONG TERM GOAL #3   Title Patient to report improved functional performance at home, including being able to mount/dismount his riding lawnmower and work outside, for periods of approximately 30 mintues with minimal unsteadiness or fatigue, in order to assist in improving functional task performance at home    PT [place] #4   Title Patient will report no falls within the past 4 weeks and will verbally be able to describe precautions and safety concepts that he can use at home, outside, and in community to assist in preventing falls   Time 8   Period Weeks   Status On-going   PT LONG TERM GOAL #5   Title Patient to demosntrate low back and bilateral hip ROM to be [facility] in order to assist in improving general mobilty and mechanics, also to reduce pain    Time 8   Period Weeks   Status On-going               Plan - 02/15/16 1532    Clinical Impression Statement Re-assessment performed today. Upon examination patient does not display any significant changes with functional measures, even showing some reduction in functional measures. Patient and his wife are quite upset with his current status, and wife became extremely tearful and upset, stating that the patient really cannot do a lot of anything at home and she feels that "I have to watch him like a child because he just can't tolerate anything evey though he tries". Wife also reports that she has been having major health issues of her own recently and they are  feeling completely overwhelemd by situation. However, upon query , they do report he has not been performing targeted HEP assitned by PT and he has instead been "tinkering" on things he needs to do outside; at this point suspect taht he may benefit more from HHPT to work with him on home based program in his envirionment in order to assist in increasing compliance with this. Wife reports that while they are able to leave the house, and while patient is able to walk approximately 60-62f, this is difficult for him and he really cannot do a lot the rest of the day when he does attempt to be active due to weakness. At this time evaluating therapist feels that patient may benefit from  possible HHPT services rather than OP PT services due to severity of pain, general home situation and difficulty leaving the home, and for assistance in optimizing home envirionemnt and tasks patient is attempting to perform at home. Will follow up with MD, and will continue with skilled OP PT services until hearing back from MD/about HHPT.     Rehab Potential Good   PT Frequency 2x / week   PT Duration 4 weeks   PT Treatment/Interventions ADLs/Self Care Home Management;Cryotherapy;Electrical Stimulation;Moist Heat;DME Instruction;Gait training;Stair training;Functional mobility training;Therapeutic activities;Therapeutic exercise;Balance training;Neuromuscular re-education;Patient/family education;Manual techniques;Passive range of motion;Energy conservation;Taping   PT Next Visit Plan Next visit to focus on SLR into flexion and abduction, core stabilization in seated and supine  position, and manual therapy techniques to reduce LBP and B knee pain.  Pt may benefit from decompression exercises to improve tolerance for supine and core strengthening if continues to have high pain levels next session.     PT Home Exercise Plan Reviewed HEP with no changes made this visit; bridges were discontinued secondary to pt c/o increased LBP    Consulted and Agree with Plan of Care Patient      Patient will benefit from skilled therapeutic intervention in order to improve the following deficits and impairments:  Abnormal gait, Decreased skin integrity, Hypomobility, Decreased activity tolerance, Decreased strength, Pain, Difficulty walking, Decreased balance, Decreased mobility, Improper body mechanics, Decreased coordination, Impaired flexibility, Postural dysfunction  Visit Diagnosis: Midline low back pain, with sciatica presence unspecified  Pain in right knee  Pain in left knee  Muscle weakness (generalized)  Difficulty in walking, not elsewhere classified  Unsteadiness on feet     Problem List Patient Active Problem List   Diagnosis Date Noted  . Left knee pain 12/04/2015  . Alcoholic peripheral neuropathy ([place]) 10/08/2014  . Diabetic neuropathy ([place]) 10/08/2014  . Chest pain, rule out acute myocardial infarction 03/10/2014  . Thrombocytopenia ([place]) 03/10/2014  . ETOH abuse 03/10/2014  . Chest pain 03/10/2014  . Diabetes mellitus without complication ([place])   . Hypertension   . Hypercholesterolemia     Deniece Ree PT, DPT [place] [address] [place], [place], [address] Phone: [phone]   Fax:  [phone]  Name: Eugene Diaz MRN: [id number] Date of Birth: 06-16-1966

## 2016-02-16 ENCOUNTER — Telehealth (HOSPITAL_COMMUNITY): Payer: Self-pay | Admitting: Physical Therapy

## 2016-02-16 NOTE — Telephone Encounter (Signed)
Called patient and educated MD's response to PT's query about possible HHPT rather than Outpatient (MD agreeable to this); also educated patient that he may either continue with OP PT until HHPT starts, as he cannot receive both, or he can just be discharged from OP PT at this point. Patient did not give clear answer, and PT will have front desk call back to confirm his decision; PT will also reach out to MD regarding patient's agreeability to [facility].   Deniece Ree PT, DPT [phone]

## 2016-02-17 ENCOUNTER — Telehealth (HOSPITAL_COMMUNITY): Payer: Self-pay | Admitting: Physical Therapy

## 2016-02-17 NOTE — Telephone Encounter (Signed)
D/C today at patient's request, while he waits on [facility].  NF 02/17/16

## 2016-02-18 ENCOUNTER — Encounter (HOSPITAL_COMMUNITY): Payer: BLUE CROSS/BLUE SHIELD | Admitting: Physical Therapy

## 2016-02-22 ENCOUNTER — Encounter (HOSPITAL_COMMUNITY): Payer: BLUE CROSS/BLUE SHIELD | Admitting: Physical Therapy

## 2016-02-25 ENCOUNTER — Encounter (HOSPITAL_COMMUNITY): Payer: BLUE CROSS/BLUE SHIELD | Admitting: Physical Therapy

## 2016-02-29 ENCOUNTER — Encounter (HOSPITAL_COMMUNITY): Payer: BLUE CROSS/BLUE SHIELD | Admitting: Physical Therapy

## 2016-03-03 ENCOUNTER — Encounter (HOSPITAL_COMMUNITY): Payer: BLUE CROSS/BLUE SHIELD | Admitting: Physical Therapy

## 2016-03-07 ENCOUNTER — Ambulatory Visit (HOSPITAL_COMMUNITY): Payer: BLUE CROSS/BLUE SHIELD | Admitting: Physical Therapy

## 2016-03-22 ENCOUNTER — Telehealth: Payer: Self-pay | Admitting: *Deleted

## 2016-03-22 DIAGNOSIS — G609 Hereditary and idiopathic neuropathy, unspecified: Secondary | ICD-10-CM

## 2016-03-22 NOTE — Telephone Encounter (Signed)
Patient was wondering if we could send in Rx for his lidocaine at the beginning of the month and at the end of the month.  Informed him that I was not sure if insurance would pay for that much each month.  Please advise.

## 2016-03-23 MED ORDER — LIDOCAINE 5 % EX OINT: 1.0000 "application " | TOPICAL_OINTMENT | Freq: Two times a day (BID) | CUTANEOUS | Status: DC | PRN

## 2016-03-23 NOTE — Addendum Note (Signed)
Addended by: Alda Berthold on: 03/23/2016 10:16 AM   Modules accepted: Orders

## 2016-03-23 NOTE — Telephone Encounter (Signed)
Left message letting patient know what was called in.

## 2016-03-23 NOTE — Telephone Encounter (Signed)
Lidocaine ointment for 100g was sent.  Everly Rubalcava K. [name], DO

## 2016-06-02 ENCOUNTER — Ambulatory Visit: Payer: BLUE CROSS/BLUE SHIELD | Admitting: Neurology

## 2016-06-03 ENCOUNTER — Encounter: Payer: Self-pay | Admitting: Neurology

## 2016-06-03 ENCOUNTER — Ambulatory Visit (INDEPENDENT_AMBULATORY_CARE_PROVIDER_SITE_OTHER): Payer: Medicaid Other | Admitting: Neurology

## 2016-06-03 VITALS — BP 130/80 | HR 90 | Ht 73.0 in | Wt 293.0 lb

## 2016-06-03 DIAGNOSIS — E0842 Diabetes mellitus due to underlying condition with diabetic polyneuropathy: Secondary | ICD-10-CM

## 2016-06-03 DIAGNOSIS — G252 Other specified forms of tremor: Secondary | ICD-10-CM | POA: Diagnosis not present

## 2016-06-03 DIAGNOSIS — G621 Alcoholic polyneuropathy: Secondary | ICD-10-CM

## 2016-06-03 NOTE — Progress Notes (Signed)
Follow-up Visit   Date: 06/03/16   Eugene Diaz MRN: [id number] DOB: [birth year]   Interim History: Eugene Diaz is a 50 y.o. left-handed Caucasian male with diabetes mellitus (HbA1c 6.5), hypertension, asthma, and chronic back pain, and prior tobacco use (62.5 pack year) returning to the clinic for follow-up of painful alcoholic neuropathy.  The patient was accompanied to the clinic by wife who also provides collateral information.    History of present illness: Patient was not seeing a medical doctor until earlier in 2015 when he was diagnosed with uncontrolled diabetes and hypertension. His also has chronic symptoms including low back pain, knee pain, and burning paresthesias of the feet.  He reports having a bilateral feet pain, described as numbness/tingling/burning sensation for at least 10 years.  It started in his toes and over the years has progressed to involve the soles of the feet.  He also has similar tingling sensation involving the mid-calf and tips of the hands. He also complains of bilateral hand soreness and reports to having a harder time manipulating objects.  He was started on Norco in the fall 2015 and reports no significant benefit.    Additionally, he also admits to a long history of alcohol use which started in his early teens.  He currently drinks 3-4 beers and liquor 4-6oz nightly for at least the past 35 years.  On weekend, he may drink up to a 12 pack of beer.  After diagnosing him with alcoholic and diabetic neuropathy, he stopped liquor and red apple ale, but continues to drink about 6-pack a night.  Of note, he injured his medial forearm at the age of ~24 while at work and sustained nerve and tendon injury. Since then, his last two fingers have been weak and medial hand has been numb.    UPDATE 06/08/2015:  Tremors are significantly improved after starting primidone 25mg  bid.  Symptoms are no better and feels as if his pain is worse.  He has generalized  pain of his legs, knees, and back.  He is not seeing pain management.    UPDATE 08/31/2015:  Because of his chronic and generalized pain, he was referred to pain mananagement but unclear why he did not follow-up.  He switched his PCP to Dr. Gerarda Fraction who recently took him off nortriptyline and added Cymbalta 30mg  daily. He has not noticed any benefit.  Primidone was increase to 50mg  twice daily which has significantly controlled his tremors.  He gets the greatest benefit with lidocaine ointment, but always ends up using too much to last him through the month.    UPDATE 06/03/2016:  Tremors are well controlled on primidone.  He is very appreciate that I increased the quantity of lidocaine as this lasts him throughout the month now.  He has burning, tingling pain of the feet and numbness to the level of his knees and elbows.  He states that he can pull the hairs out of his legs and arms and cannot feel it.   He complains of new right lateral thigh numbness.  He still and generalized joint pain and swelling and rheumatology told him he had osteoarthritis.  Medications:  Current Outpatient Prescriptions on File Prior to Visit  Medication Sig Dispense Refill  . ADVAIR DISKUS 250-50 MCG/DOSE AEPB Inhale 1 puff into the lungs 2 (two) times daily.    Marland Kitchen albuterol (PROVENTIL HFA;VENTOLIN HFA) 108 (90 BASE) MCG/ACT inhaler Inhale 2 puffs into the lungs every 6 (six) hours as needed for  wheezing or shortness of breath. 1 Inhaler 2  . ALPRAZolam (XANAX) 1 MG tablet   4  . atorvastatin (LIPITOR) 40 MG tablet   10  . DULoxetine (CYMBALTA) 60 MG capsule   10  . gabapentin (NEURONTIN) 300 MG capsule Take 900 mg by mouth 3 (three) times daily. 900 am, 900 noon, 1200 qhs    . glycopyrrolate (ROBINUL) 1 MG tablet   2  . INVOKANA 300 MG TABS tablet   2  . lidocaine (XYLOCAINE) 5 % ointment Apply 1 application topically 2 (two) times daily as needed. 100 g 5  . lisinopril (PRINIVIL,ZESTRIL) 20 MG tablet   4  . nortriptyline  (PAMELOR) 75 MG capsule   4  . oxyCODONE (ROXICODONE) 15 MG immediate release tablet   0  . primidone (MYSOLINE) 50 MG tablet Take 1 tablet (50 mg total) by mouth 2 (two) times daily. 60 tablet 5  . VOLTAREN 1 % GEL     . zolpidem (AMBIEN) 10 MG tablet   3   No current facility-administered medications on file prior to visit.     Allergies: No Known Allergies  Review of Systems:  CONSTITUTIONAL: No fevers, chills, night sweats, or weight loss.  EYES: No visual changes or eye pain ENT: No hearing changes.  No history of nose bleeds.   RESPIRATORY: No cough, wheezing and shortness of breath.   CARDIOVASCULAR: Negative for chest pain, and palpitations.   GI: Negative for abdominal discomfort, blood in stools or black stools.  No recent change in bowel habits.   GU:  No history of incontinence.   MUSCLOSKELETAL: +history of joint pain or swelling.  No myalgias.   SKIN: Negative for lesions, rash, and itching.   ENDOCRINE: Negative for cold or heat intolerance, polydipsia or goiter.   PSYCH:  + depression or anxiety symptoms.   NEURO: As Above.   Vital Signs:  BP 130/80   Pulse 90   Ht 6\' 1"  (1.854 m)   Wt 293 lb (132.9 kg)   SpO2 98%   BMI 38.66 kg/m   Neurological Exam: MENTAL STATUS including orientation to time, place, person, recent and remote memory, attention span and concentration, language, and fund of knowledge is fair.  Speech is not dysarthric.  Appears very uncomfortable, dishevled.   CRANIAL NERVES:  Pupils equal round and reactive to light.  Face is symmetric.  MOTOR:  Mild atrophy of right instrinsic hand muscles. No hand tremor. Left digits 4 and 5 with flexion deformity and hypotonia (old ulnar nerve injury). Motor strength 5/5 throughout except 3/5 left dorsal interosseus, 5-/5 finger flexors, and 4+/5 bilateral dorsiflexion and toe extensors/flexors.    MSRs:  Reflexes are 2+/4 throughout, except absent at the Achilles bilaterally.  SENSORY: Sensation to  all modalities is reduced at mid-calf and absent in the feet. Sensation diminished over the right lateral thigh (LFCN distribution)  COORDINATION/GAIT:  Antalgic and unsteady gait assisted with walker  Data: Lab 10/07/2014:  Vitamin B1 8, vitamin B6 9.1, folate 7.5, copper 78, ceruloplasmin, zinc 81, vitamin B12 370, TSH 0.906, SPEP/UPEP with IFE no M protein  EMG 11/13/2014 of the right upper and lower extremity: 1.  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 A superimposed right median neuropathy at the wrist and ulnar neuropathy at the elbow is likely. 2. Chronic L3-L4 radiculopathy affecting the right lower extremity, mild in degree electrically. 3. Chronic S1  radiculopathy affecting the right lower extremity, mild in degree electrically.   IMPRESSION/PLAN: Chronic pain syndrome with low back pain, painful neuropathy (diabetic and alcoholic), and arthralgias impairing his gait. Stable - Continue neurontin 900mg  in the morning and afternoon, add 1200mg  at bedtime - Continue lidocaine ointment   - Encouraged to use a rollator, fall precautions discussed  Right meralgia paresthetica. Based on history of exam, he has meralgia paresthetica an entrapment of the lateral femoral cutaneous nerve as it exits below the inguinal canal. Management is conservative with medication and wight loss.  Extensive portion of the visit discussed lifestyle modification and healthy diet choices  Polyarthralgias, followed by PCP  Alcohol induced tremor, stable - Strongly encouraged him to continue to wean his drinking, currently still drinking 6 beers nightly - Continue primidone 50mg  twice daily  Return to clinic in 6 months   The duration of this appointment visit was 30 minutes of face-to-face time with the patient.  Greater than 50% of this time was spent in counseling,  explanation of diagnosis, planning of further management, and coordination of care.   Thank you for allowing me to participate in patient's care.  If I can answer any additional questions, I would be pleased to do so.    Sincerely,    Alegra Rost K. [name], DO

## 2016-06-30 ENCOUNTER — Encounter (HOSPITAL_COMMUNITY): Payer: Self-pay | Admitting: Physical Therapy

## 2016-06-30 NOTE — Therapy (Signed)
[facility] [place], [place], [address] Phone: [phone]   Fax:  [phone]  Patient Details  Name: Eugene Diaz MRN: [id number] Date of Birth: 1965/11/28 Referring Provider:  No ref. provider found  Encounter Date: 06/30/2016   PHYSICAL THERAPY DISCHARGE SUMMARY  Visits from Start of Care: 6  Current functional level related to goals / functional outcomes: Patient referred to HHPT due to functional status, DC from skilled OP PT services.    Remaining deficits: No changes since initial eval    Education / Equipment: N/A  Plan: Patient agrees to discharge.  Patient goals were not met. Patient is being discharged due to lack of progress.  ?????    Deniece Ree PT, DPT [facility] [address] [place], [place], [address] Phone: [phone]   Fax:  [phone]

## 2016-12-09 ENCOUNTER — Ambulatory Visit (INDEPENDENT_AMBULATORY_CARE_PROVIDER_SITE_OTHER): Payer: Medicaid Other | Admitting: Neurology

## 2016-12-09 ENCOUNTER — Encounter: Payer: Self-pay | Admitting: Neurology

## 2016-12-09 VITALS — BP 134/82 | HR 90 | Ht 73.0 in | Wt 293.2 lb

## 2016-12-09 DIAGNOSIS — G252 Other specified forms of tremor: Secondary | ICD-10-CM | POA: Diagnosis not present

## 2016-12-09 DIAGNOSIS — G621 Alcoholic polyneuropathy: Secondary | ICD-10-CM

## 2016-12-09 DIAGNOSIS — E0842 Diabetes mellitus due to underlying condition with diabetic polyneuropathy: Secondary | ICD-10-CM | POA: Diagnosis not present
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
## 2016-12-09 NOTE — Patient Instructions (Addendum)
Continue your medications as you are taking  Return to clinic in 9 months 

## 2017-04-07 ENCOUNTER — Other Ambulatory Visit (HOSPITAL_COMMUNITY): Payer: Self-pay | Admitting: Internal Medicine

## 2017-04-07 ENCOUNTER — Ambulatory Visit (HOSPITAL_COMMUNITY)
Admission: RE | Admit: 2017-04-07 | Discharge: 2017-04-07 | Disposition: A | Discharge status: Home / Self Care | Payer: Medicaid Other | Source: Ambulatory Visit | Attending: Internal Medicine | Admitting: Internal Medicine

## 2017-04-07 DIAGNOSIS — R05 Cough: Secondary | ICD-10-CM | POA: Diagnosis not present

## 2017-04-07 DIAGNOSIS — R918 Other nonspecific abnormal finding of lung field: Secondary | ICD-10-CM | POA: Insufficient documentation

## 2017-04-07 DIAGNOSIS — R059 Cough, unspecified: Secondary | ICD-10-CM

## 2017-09-08 ENCOUNTER — Ambulatory Visit (INDEPENDENT_AMBULATORY_CARE_PROVIDER_SITE_OTHER): Payer: Medicaid Other | Admitting: Neurology

## 2017-09-08 ENCOUNTER — Encounter: Payer: Self-pay | Admitting: Neurology

## 2017-09-08 VITALS — BP 130/70 | HR 93 | Ht 73.0 in | Wt 299.5 lb

## 2017-09-08 DIAGNOSIS — G621 Alcoholic polyneuropathy: Secondary | ICD-10-CM

## 2017-09-08 DIAGNOSIS — E0842 Diabetes mellitus due to underlying condition with diabetic polyneuropathy: Secondary | ICD-10-CM

## 2017-09-08 NOTE — Patient Instructions (Addendum)
Reduce gabapentin 900mg  three times daily.    Each week, reduce gabapentin by 300mg  per week.  Start with your morning dose.  Continue primidone 50mg  twice daily  Continue lidocaine ointment  We will make a referral to Pain Management   Return to clinic in 9 month

## 2017-09-08 NOTE — Progress Notes (Signed)
Follow-up Visit   Date: 09/08/17   Eugene Diaz MRN: [id number] DOB: 1965/12/04   Interim History: Eugene Diaz is a 51 y.o. left-handed Caucasian male with diabetes mellitus (HbA1c 6.5), hypertension, asthma, and chronic back pain, and prior tobacco use (62.5 pack year) returning to the clinic for follow-up of painful alcoholic neuropathy.  The patient was accompanied to the clinic by wife who also provides collateral information.    History of present illness: Patient was not seeing a medical doctor until earlier in 2015 when he was diagnosed with uncontrolled diabetes and hypertension. His also has chronic symptoms including low back pain, knee pain, and burning paresthesias of the feet.  He reports having a bilateral feet pain, described as numbness/tingling/burning sensation for at least 10 years.  It started in his toes and over the years has progressed to involve the soles of the feet.  He also has similar tingling sensation involving the mid-calf and tips of the hands. He was started on Norco in the fall 2015 and reports no significant benefit.    Additionally, he also admits to a long history of alcohol use which started in his early teens.  He currently drinks 3-4 beers and liquor 4-6oz nightly for at least the past 35 years.  On weekend, he may drink up to a 12 pack of beer.  After diagnosing him with alcoholic and diabetic neuropathy, he stopped liquor and red apple ale, but continues to drink about 6-pack a night.  Of note, he injured his medial forearm at the age of ~67 while at work and sustained nerve and tendon injury. Since then, his last two fingers have been weak and medial hand has been numb.    He takes primidone 50mg  BID for tremors.   UPDATE 06/03/2016:  Tremors are well controlled on primidone.  He is very appreciate that I increased the quantity of lidocaine as this lasts him throughout the month now.  He has burning, tingling pain of the feet and numbness to  the level of his knees and elbows.  He states that he can pull the hairs out of his legs and arms and cannot feel it.   He complains of new right lateral thigh numbness.  He still and generalized joint pain and swelling and rheumatology told him he had osteoarthritis.  UPDATE 12/09/2016:  His neuropathy seems to be moderately controlled on neurontin 3000mg /d and lidocaine.  He also takes Cymbalta 60mg , nortriptyline 75mg , and roxicodone for pain which is prescribed by his PCP.  He feels that he has more numbness and cold sensation of the lower legs than tingling.   UPDATE 09/08/2017:  He is here for 9 month follow-up visit.  Unfortunately, he continues to have burning pain of the feet and lower legs and does not feel that Neurontin helps.  He is complaining of hand and leg swelling, too.  He continues to fall frequently and uses a cane as needed.  He continues to drink 6 pack beer nightly and does not seem interested in cutting back.  Tremors are well-controlled on primidone.     Medications:  Current Outpatient Medications on File Prior to Visit  Medication Sig Dispense Refill  . ADVAIR DISKUS 250-50 MCG/DOSE AEPB Inhale 1 puff into the lungs 2 (two) times daily.    Marland Kitchen albuterol (PROVENTIL HFA;VENTOLIN HFA) 108 (90 BASE) MCG/ACT inhaler Inhale 2 puffs into the lungs every 6 (six) hours as needed for wheezing or shortness of breath. 1 Inhaler 2  .  ALPRAZolam (XANAX) 1 MG tablet   4  . atorvastatin (LIPITOR) 40 MG tablet   10  . DULoxetine (CYMBALTA) 60 MG capsule   10  . gabapentin (NEURONTIN) 300 MG capsule Take 900 mg by mouth 3 (three) times daily. 900 am, 900 noon, 1200 qhs    . glycopyrrolate (ROBINUL) 1 MG tablet   2  . INVOKANA 300 MG TABS tablet   2  . lidocaine (XYLOCAINE) 5 % ointment Apply 1 application topically 2 (two) times daily as needed. 100 g 5  . lisinopril (PRINIVIL,ZESTRIL) 20 MG tablet   4  . nortriptyline (PAMELOR) 75 MG capsule   4  . oxyCODONE (ROXICODONE) 15 MG immediate  release tablet   0  . primidone (MYSOLINE) 50 MG tablet Take 1 tablet (50 mg total) by mouth 2 (two) times daily. 60 tablet 5  . VOLTAREN 1 % GEL     . zolpidem (AMBIEN) 10 MG tablet   3   No current facility-administered medications on file prior to visit.     Allergies: No Known Allergies  Review of Systems:  CONSTITUTIONAL: No fevers, chills, night sweats, or weight loss.  EYES: No visual changes or eye pain ENT: No hearing changes.  No history of nose bleeds.   RESPIRATORY: No cough, wheezing and shortness of breath.   CARDIOVASCULAR: Negative for chest pain, and palpitations.   GI: Negative for abdominal discomfort, blood in stools or black stools.  No recent change in bowel habits.   GU:  No history of incontinence.   MUSCLOSKELETAL: +history of joint pain or swelling.  No myalgias.   SKIN: Negative for lesions, rash, and itching.   ENDOCRINE: Negative for cold or heat intolerance, polydipsia or goiter.   PSYCH:  + depression or anxiety symptoms.   NEURO: As Above.   Vital Signs:  BP 130/70   Pulse 93   Ht 6\' 1"  (1.854 m)   Wt 299 lb 8 oz (135.9 kg)   SpO2 95%   BMI 39.51 kg/m   Neurological Exam: MENTAL STATUS including orientation to time, place, person is intact.   CRANIAL NERVES:  Pupils equal round and reactive to light.  Face is symmetric.  MOTOR:  Mild atrophy of right instrinsic hand muscles. No tremor. Left digits 4 and 5 with flexion deformity and hypotonia (old ulnar nerve injury). Motor strength 5/5 throughout except 3/5 left dorsal interosseus, 5-/5 finger flexors, and 4/5 bilateral dorsiflexion and toe extensors/flexors.    There is 1+ pitting edema of the lower legs.  MSRs:  Reflexes are 2+/4 throughout, except absent at the Achilles bilaterally.  SENSORY: Sensation to all modalities is reduced at mid-calf and absent in the feet.  COORDINATION/GAIT:  Antalgic wide-based gait, assisted with cane.  Data: Lab 10/07/2014:  Vitamin B1 8, vitamin B6  9.1, folate 7.5, copper 78, ceruloplasmin, zinc 81, vitamin B12 370, TSH 0.906, SPEP/UPEP with IFE no M protein  EMG 11/13/2014 of the right upper and lower extremity: 1.  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 A superimposed right median neuropathy at the wrist and ulnar neuropathy at the elbow is likely. 2. Chronic L3-L4 radiculopathy affecting the right lower extremity, mild in degree electrically. 3. Chronic S1 radiculopathy affecting the right lower extremity, mild in degree electrically.   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  Previously well controlled on gabapentin 3000/mg daily, in addition to pain  medications he takes for chronic pain (see below), but now complaining of worsening pain. He also has swelling of the legs and hands which may be a side effects of gabapentin.  Therefore, recommend that he reduce gabapentin by 300mg /week. Continue lidocaine ointment.  He is unable to afford Lyrica.  I will refer him to Pain Management for ongoing pain management - he has been referred to Pain Management in the past, but did not follow through.  Alcohol induced tremor, stable.  Again, I stressed the importance of alcohol cessation to minimize progression of neuropathy.  He continues to drink 6 beers daily.  Continue primidone 50mg  twice daily  Right meralgia paresthetica, stable.    Chronic pain syndrome with low back pain, knee pain, and arthralgias impairing his gait, followed by his PCP - He also takes Cymbalta 60mg , nortriptyline 75mg  daily, and roxicodone   Return to clinic in 9 months  Greater than 50% of this 30 minute visit was spent in counseling, explanation of diagnosis, planning of further management, and coordination of care.    Thank you for allowing me to participate in patient's care.  If I can answer any  additional questions, I would be pleased to do so.    Sincerely,    Jyllian Haynie K. [name], DO

## 2017-09-18 ENCOUNTER — Other Ambulatory Visit: Payer: Self-pay | Admitting: *Deleted

## 2017-09-18 ENCOUNTER — Telehealth: Payer: Self-pay | Admitting: Neurology

## 2017-09-18 NOTE — Telephone Encounter (Signed)
Patient wants to speak with someone about medication please call

## 2017-09-18 NOTE — Telephone Encounter (Signed)
Patient called to let you know that he has not been taking his medication at night.  Instructed him to take his medication as directed.  Patient agreed with plan.  Dr. [name] notified.

## 2017-11-16 ENCOUNTER — Encounter: Payer: Self-pay | Admitting: Gastroenterology

## 2017-12-25 LAB — HM DIABETES EYE EXAM

## 2017-12-27 ENCOUNTER — Ambulatory Visit: Payer: Medicaid Other | Admitting: Gastroenterology

## 2017-12-27 ENCOUNTER — Other Ambulatory Visit: Payer: Self-pay | Admitting: *Deleted

## 2017-12-27 ENCOUNTER — Encounter: Payer: Self-pay | Admitting: *Deleted

## 2017-12-27 ENCOUNTER — Other Ambulatory Visit: Payer: Self-pay

## 2017-12-27 ENCOUNTER — Telehealth: Payer: Self-pay | Admitting: *Deleted

## 2017-12-27 ENCOUNTER — Encounter: Payer: Self-pay | Admitting: Gastroenterology

## 2017-12-27 DIAGNOSIS — F1010 Alcohol abuse, uncomplicated: Secondary | ICD-10-CM | POA: Diagnosis not present

## 2017-12-27 DIAGNOSIS — K625 Hemorrhage of anus and rectum: Secondary | ICD-10-CM

## 2017-12-27 DIAGNOSIS — R1310 Dysphagia, unspecified: Secondary | ICD-10-CM

## 2017-12-27 NOTE — Assessment & Plan Note (Signed)
SYMPTOMS NOT IDEALLY CONTROLLED. DIFFERENTIAL DIAGNOSIS INCLUDES HEMORRHOIDS, COLON POLYPS, AVMs, & LESS LIKELY COLON CA.  DRINK WATER TO KEEP YOUR URINE LIGHT YELLOW. Follow a CLEAR LIQUID DIET ON THE DAY BEFORE YOUR COLONOSCOPY. SAMPLE PREP GIVEN. COMPLETE COLONOSCOPY IN 2 3 WEEKS. YOU MAY BRING THE ENEMA TO ADMINISTER IN THE PREOP AREA. FOLLOW UP IN THE OFFICE WILL BE SCHEDULED IF NEEDED AFTER ENDOSCOPY.

## 2017-12-27 NOTE — Telephone Encounter (Signed)
Spoke with pt and is aware pre-op scheduled for 02/07/18 at 10:00am. Letter mailed

## 2017-12-27 NOTE — Progress Notes (Signed)
cc'ed to pcp °

## 2017-12-27 NOTE — Progress Notes (Signed)
Subjective:    Patient ID: Eugene Diaz, male    DOB: 1966-08-25, 52 y.o.   MRN: [id number]  [employer], MD   HPI Rectal bleeding and change in bowel habits. Off and blood in stool for past 6 mos. Just when he wipes. sometimes can be a hand full. No RECTAL PRESSURE, PAIN, ITCHING, BURNING, but may have SOILING(stool). Thinks he has a hemorrhoid back there. NO ASPIRIN, BC/GOODY POWDERS, OR NAPROXEN/ALEVE. Rare IBUPROFEN/MOTRIN. ETOH: 3-4 CANS A DAY, COUPLE SHOTS OF LIQUOR(JIM BEAM). MAY HAVE CONSTIPATION. SOB DUE TO ASTHMA AND LATELY FEELS LIKE IT'S GETTING WORSE. MAY FEEL SOLID FOOD GETS STUCK IN CHEST.   PT DENIES FEVER, CHILLS, HEMATEMESIS, nausea, vomiting, melena, diarrhea, CHEST PAIN, CHANGE IN BOWEL IN HABITS, abdominal pain, problems with sedation,OR heartburn or indigestion.  Past Medical History:  Diagnosis Date  . Asthma   . Diabetes mellitus without complication ([place]) 0/9326  . Hypercholesterolemia   . Hypertension   . Peripheral vascular disease [facility])    Past Surgical History:  Procedure Laterality Date  . Left arm     Left arm repair (tendon and artery)  . PILONIDAL CYST EXCISION N/A 08/08/2014   Procedure: CYST EXCISION PILONIDAL EXTENSIVE;  Surgeon: Jamesetta So, MD;  Location: [facility];  Service: General;  Laterality: N/A;   No Known Allergies  Current Outpatient Medications  Medication Sig Dispense Refill  . ADVAIR DISKUS 250-50 MCG/DOSE AEPB Inhale 1 puff into the lungs 2 (two) times daily.    Marland Kitchen albuterol (PROVENTIL HFA;VENTOLIN HFA) 108 (90 BASE) MCG/ACT inhaler Inhale 2 puffs into the lungs every 6 (six) hours as needed for wheezing or shortness of breath.    . ALPRAZolam (XANAX) 1 MG tablet Take 1 mg by mouth 4 (four) times daily.     Marland Kitchen amitriptyline (ELAVIL) 100 MG tablet Take 100 mg by mouth at bedtime.    Marland Kitchen atorvastatin (LIPITOR) 40 MG tablet Take 40 mg by mouth daily.     . DULoxetine (CYMBALTA) 60 MG capsule Take 60 mg by mouth 2 (two) times  daily.     Marland Kitchen gabapentin (NEURONTIN) 300 MG capsule Take 900 mg by mouth 3 (three) times daily. 900 am, 900 noon, 1200 qhs    . glycopyrrolate (ROBINUL) 1 MG tablet Take 1 mg by mouth daily.     . INVOKANA 300 MG TABS tablet Take 300 mg by mouth daily before breakfast.     . lidocaine (XYLOCAINE) 5 % ointment Apply 1 application topically 2 (two) times daily as needed.    Marland Kitchen lisinopril (PRINIVIL,ZESTRIL) 20 MG tablet Take 20 mg by mouth daily.     . nortriptyline (PAMELOR) 75 MG capsule Take 75 mg by mouth daily.     Marland Kitchen oxyCODONE (ROXICODONE) 15 MG immediate release tablet Take 15 mg by mouth 4 (four) times daily.     . primidone (MYSOLINE) 50 MG tablet Take 1 tablet (50 mg total) by mouth 2 (two) times daily.    . VOLTAREN 1 % GEL Apply topically as needed.     . zolpidem (AMBIEN) 10 MG tablet Take 10 mg by mouth at bedtime.      Family History  Problem Relation Age of Onset  . Heart attack Mother        Living, 59  . Cancer Father        Deceased  . Healthy Brother   . Healthy Sister   . Colon cancer Neg Hx   . Colon polyps Neg Hx  Social History   Socioeconomic History  . Marital status: Married    Spouse name: None  . Number of children: None  . Years of education: None  . Highest education level: None  Social Needs  . Financial resource strain: None  . Food insecurity - worry: None  . Food insecurity - inability: None  . Transportation needs - medical: None  . Transportation needs - non-medical: None  Occupational History  . 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  Tobacco Use  . Smoking status: Former Smoker    Packs/day: 2.50    Years: 25.00    Pack years: 62.50    Types: Cigarettes    Last attempt to quit: 08/05/1984    Years since quitting: 33.4  . Smokeless tobacco: Never Used  Substance and Sexual Activity  . Alcohol use: Yes    Alcohol/week: 30.0 oz    Types: 50 Standard drinks or equivalent per week    Comment: Drinks  3-4 beers, liquor 4-6oz x 35 years  . Drug use: Yes    Types: Marijuana    Comment: occasionally   . Sexual activity: Yes  Other Topics Concern  . None  Social History Narrative   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 Highest level of education:  8th grade   He lives with wife.  No children.   Review of Systems PER HPI OTHERWISE ALL SYSTEMS ARE NEGATIVE.    Objective:   Physical Exam  Constitutional: He is oriented to person, place, and time. He appears well-developed and well-nourished. No distress.  HENT:  Head: Normocephalic and atraumatic.  Mouth/Throat: Oropharynx is clear and moist. No oropharyngeal exudate.  Eyes: Pupils are equal, round, and reactive to light. No scleral icterus.  Neck: Normal range of motion. Neck supple.  Cardiovascular: Normal rate, regular rhythm and normal heart sounds.  Pulmonary/Chest: Effort normal and breath sounds normal. No respiratory distress.  Abdominal: Soft. Bowel sounds are normal. He exhibits no distension. There is no tenderness.  Musculoskeletal: He exhibits no edema.  BRACE ON BILATERAL KNEES  Lymphadenopathy:    He has no cervical adenopathy.  Neurological: He is alert and oriented to person, place, and time.  NO  NEW FOCAL DEFICITS  Psychiatric: He has a normal mood and affect.  Vitals reviewed.     Assessment & Plan:

## 2017-12-27 NOTE — Assessment & Plan Note (Signed)
AT RISK FOR CIRRHOSIS: > 14 DRINKS A WEEK.  CHECK CMP, CBC, PT/INR WITH PREOP LABS. WILL COUNSEL PT TO CUT DOWN.

## 2017-12-27 NOTE — Progress Notes (Signed)
Opened in error

## 2017-12-27 NOTE — Patient Instructions (Addendum)
DRINK WATER TO KEEP YOUR URINE LIGHT YELLOW.   Follow a CLEAR LIQUID DIET ON THE DAY BEFORE YOUR COLONOSCOPY.   OTHERWISE FOLLOW A SOFT MECHANICAL DIET.  MEATS SHOULD BE GROUND ONLY. DO NOT EAT CHUNKS OF ANYTHING. SEE INFO BELOW.  COMPLETE COLONOSCOPY AND UPPER ENDOSCOPY TO STRETCH YOUR ESOPHAGUS IN 2 3 WEEKS. YOU MAY BRING THE ENEMA TO ADMINISTER IN THE PREOP AREA.  FOLLOW UP IN THE OFFICE WILL BE SCHEDULED IF NEEDED AFTER ENDOSCOPY.  SOFT MECHANICAL DIET This SOFT MECHANICAL DIET is restricted to:  Foods that are moist, soft-textured, and easy to chew and swallow.   Meats that are ground or are minced no larger than one-quarter inch pieces. Meats are moist with gravy or sauce added.   Foods that do not include bread or bread-like textures except soft pancakes, well-moistened with syrup or sauce.   Textures with some chewing ability required.   Casseroles without rice.   Cooked vegetables that are less than half an inch in size and easily mashed with a fork. No cooked corn, peas, broccoli, cauliflower, cabbage, Brussels sprouts, asparagus, or other fibrous, non-tender or rubbery cooked vegetables.   Canned fruit except for pineapple. Fruit must be cut into pieces no larger than half an inch in size.   Foods that do not include nuts, seeds, coconut, or sticky textures.   FOOD TEXTURES FOR DYSPHAGIA DIET LEVEL 2 -SOFT MECHANICAL DIET (includes all foods on Dysphagia Diet Level 1 - Pureed, in addition to the foods listed below)  FOOD GROUP: Breads. RECOMMENDED: Soft pancakes, well-moistened with syrup or sauce.  AVOID: All others.  FOOD GROUP: Cereals.  RECOMMENDED: Cooked cereals with little texture, including oatmeal. Unprocessed wheat bran stirred into cereals for bulk. Note: If thin liquids are restricted, it is important that all of the liquid is absorbed into the cereal.  AVOID: All dry cereals and any cooked cereals that may contain flax seeds or other seeds or nuts.  Whole-grain, dry, or coarse cereals. Cereals with nuts, seeds, dried fruit, and/or coconut.  FOOD GROUP: Desserts. RECOMMENDED: Pudding, custard. Soft fruit pies with bottom crust only. Canned fruit (excluding pineapple). Soft, moist cakes with icing.Frozen malts, milk shakes, frozen yogurt, eggnog, nutritional supplements, ice cream, sherbet, regular or sugar-free gelatin, or any foods that become thin liquid at either room (70 F) or body temperature (98 F).  AVOID: Dry, coarse cakes and cookies. Anything with nuts, seeds, coconut, pineapple, or dried fruit. Breakfast yogurt with nuts. Rice or bread pudding.  FOOD GROUP: Fats. RECOMMENDED: Butter, margarine, cream for cereal (depending on liquid consistency recommendations), gravy, cream sauces, sour cream, sour cream dips with soft additives, mayonnaise, salad dressings, cream cheese, cream cheese spreads with soft additives, whipped toppings.  AVOID: All fats with coarse or chunky additives.  FOOD GROUP: Fruits. RECOMMENDED: Soft drained, canned, or cooked fruits without seeds or skin. Fresh soft and ripe banana. Fruit juices with a small amount of pulp. If thin liquids are restricted, fruit juices should be thickened to appropriate consistency.  AVOID: Fresh or frozen fruits. Cooked fruit with skin or seeds. Dried fruits. Fresh, canned, or cooked pineapple.  FOOD GROUP: Meats and Meat Substitutes. (Meat pieces should not exceed 1/4 of an inch cube and should be tender.) RECOMMENDED: Moistened ground or cooked meat, poultry, or fish. Moist ground or tender meat may be served with gravy or sauce. Casseroles without rice. Moist macaroni and cheese, well-cooked pasta with meat sauce, tuna noodle casserole, soft, moist lasagna. Moist meatballs, meatloaf, or  fish loaf. Protein salads, such as tuna or egg without large chunks, celery, or onion. Cottage cheese, smooth quiche without large chunks. Poached, scrambled, or soft-cooked eggs (egg yolks  should not be "runny" but should be moist and able to be mashed with butter, margarine, or other moisture added to them). (Cook eggs to 160 F or use pasteurized eggs for safety.) Souffls may have small, soft chunks. Tofu. Well-cooked, slightly mashed, moist legumes, such as baked beans. All meats or protein substitutes should be served with sauces or moistened to help maintain cohesiveness in the oral cavity.  AVOID: Dry meats, tough meats (such as bacon, sausage, hot dogs, bratwurst). Dry casseroles or casseroles with rice or large chunks. Peanut butter. Cheese slices and cubes. Hard-cooked or crisp fried eggs. Sandwiches.Pizza.  FOOD GROUP: Potatoes and Starches. RECOMMENDED: Well-cooked, moistened, boiled, baked, or mashed potatoes. Well-cooked shredded hash brown potatoes that are not crisp. (All potatoes need to be moist and in sauces.)Well-cooked noodles in sauce. Spaetzel or soft dumplings that have been moistened with butter or gravy.  AVOID: Potato skins and chips. Fried or French-fried potatoes. Rice.  FOOD GROUP: Soups. RECOMMENDED: Soups with easy-to-chew or easy-to-swallow meats or vegetables: Particle sizes in soups should be less than 1/2 inch. Soups will need to be thickened to appropriate consistency if soup is thinner than prescribed liquid consistency.  AVOID: Soups with large chunks of meat and vegetables. Soups with rice, corn, peas.  FOOD GROUP: Vegetables. RECOMMENDED: All soft, well-cooked vegetables. Vegetables should be less than a half inch. Should be easily mashed with a fork.  AVOID: Cooked corn and peas. Broccoli, cabbage, Brussels sprouts, asparagus, or other fibrous, non-tender or rubbery cooked vegetables.  FOOD GROUP: Miscellaneous. RECOMMENDED: Jams and preserves without seeds, jelly. Sauces, salsas, etc., that may have small tender chunks less than 1/2 inch. Soft, smooth chocolate bars that are easily chewed.  AVOID: Seeds, nuts, coconut, or sticky foods.  Chewy candies such as caramels or licorice.

## 2017-12-27 NOTE — Assessment & Plan Note (Signed)
TO SOLIDS IN PT WITH DAILY ETOH USE AND A FORMER SMOKER. DIFFERENTIAL DIAGNOSIS INCLUDES: PEPTIC STRICTURE, PRIMARY ESOPHAGEAL MOTILITY DISORDER,OR 2o ESOPHAGEAL MOTILITY DISORDER DUE TO UNCONTROLLED REFLUX, LESS LIKLEY ESOPHAGEAL CANCER.   DRINK WATER TO KEEP YOUR URINE LIGHT YELLOW.  FOLLOW A SOFT MECHANICAL DIET: MEATS SHOULD BE GROUND ONLY. DO NOT EAT CHUNKS OF ANYTHING. HANDOUT GIVEN. COMPLETE UPPER ENDOSCOPY TO STRETCH YOUR ESOPHAGUS WITH PROPOFOL DUE TO ETOH USE/POLYPHARMACY IN 2 3 WEEKS. YOU MAY BRING THE ENEMA TO ADMINISTER IN THE PREOP AREA. DISCUSSED PROCEDURE, BENEFITS, & RISKS: < 1% chance of medication reaction, bleeding, perforation, or rupture of spleen/liver. FOLLOW UP IN THE OFFICE WILL BE SCHEDULED IF NEEDED AFTER ENDOSCOPY.

## 2018-01-25 ENCOUNTER — Other Ambulatory Visit: Payer: Self-pay | Admitting: *Deleted

## 2018-01-25 MED ORDER — GABAPENTIN 300 MG PO CAPS: ORAL_CAPSULE | ORAL | 5 refills | Status: DC

## 2018-02-05 ENCOUNTER — Telehealth: Payer: Self-pay

## 2018-02-05 NOTE — Telephone Encounter (Signed)
Pt came by the office, he was uncertain about his prep instructions. He brought his prep box with him. I printed his instructions and went over them with him. He did not have any instructions regarding his diabetic medications, I spoke with Vicente Males and he needs to hold his DM medication the morning of his procedure. I wrote this on his instructions and he is aware.  He thought his procedure was tomorrow. I went over the date and time and how to do his prep and the clear liquid diet multiple times. He stated he understood when he left.

## 2018-02-06 NOTE — Patient Instructions (Signed)
Eugene Diaz  02/06/2018     @PREFPERIOPPHARMACY @   Your procedure is scheduled on  02/13/2018   Report to [facility] at  70   A.M.  Call this number if you have problems the morning of surgery:  [phone]   Remember:  Do not eat food or drink liquids after midnight.  Take these medicines the morning of surgery with A SIP OF WATER  Xanax, cymbalta, neurontin, robinul, lisinopril, oxycodone, primidone. Use your inhaler before you come.    Do not wear jewelry, make-up or nail polish.  Do not wear lotions, powders, or perfumes, or deodorant.  Do not shave 48 hours prior to surgery.  Men may shave face and neck.  Do not bring valuables to the hospital.  [facility] is not responsible for any belongings or valuables.  Contacts, dentures or bridgework may not be worn into surgery.  Leave your suitcase in the car.  After surgery it may be brought to your room.  For patients admitted to the hospital, discharge time will be determined by your treatment team.  Patients discharged the day of surgery will not be allowed to drive home.   Name and phone number of your driver:   family Special instructions:  Follow the diet and prep instructions given to you by Dr Nona Dell office.  Please read over the following fact sheets that you were given. Anesthesia Post-op Instructions and Care and Recovery After Surgery       Esophagogastroduodenoscopy Esophagogastroduodenoscopy (EGD) is a procedure to examine the lining of the esophagus, stomach, and first part of the small intestine (duodenum). This procedure is done to check for problems such as inflammation, bleeding, ulcers, or growths. During this procedure, a long, flexible, lighted tube with a camera attached (endoscope) is inserted down the throat. Tell a health care provider about:  Any allergies you have.  All medicines you are taking, including vitamins, herbs, eye drops, creams, and over-the-counter  medicines.  Any problems you or family members have had with anesthetic medicines.  Any blood disorders you have.  Any surgeries you have had.  Any medical conditions you have.  Whether you are pregnant or may be pregnant. What are the risks? Generally, this is a safe procedure. However, problems may occur, including:  Infection.  Bleeding.  A tear (perforation) in the esophagus, stomach, or duodenum.  Trouble breathing.  Excessive sweating.  Spasms of the larynx.  A slowed heartbeat.  Low blood pressure.  What happens before the procedure?  Follow instructions from your health care provider about eating or drinking restrictions.  Ask your health care provider about: ? Changing or stopping your regular medicines. This is especially important if you are taking diabetes medicines or blood thinners. ? Taking medicines such as aspirin and ibuprofen. These medicines can thin your blood. Do not take these medicines before your procedure if your health care provider instructs you not to.  Plan to have someone take you home after the procedure.  If you wear dentures, be ready to remove them before the procedure. What happens during the procedure?  To reduce your risk of infection, your health care team will wash or sanitize their hands.  An IV tube will be put in a vein in your hand or arm. You will get medicines and fluids through this tube.  You will be given one or more of the following: ? A medicine  to help you relax (sedative). ? A medicine to numb the area (local anesthetic). This medicine may be sprayed into your throat. It will make you feel more comfortable and keep you from gagging or coughing during the procedure. ? A medicine for pain.  A mouth guard may be placed in your mouth to protect your teeth and to keep you from biting on the endoscope.  You will be asked to lie on your left side.  The endoscope will be lowered down your throat into your esophagus,  stomach, and duodenum.  Air will be put into the endoscope. This will help your health care provider see better.  The lining of your esophagus, stomach, and duodenum will be examined.  Your health care provider may: ? Take a tissue sample so it can be looked at in a lab (biopsy). ? Remove growths. ? Remove objects (foreign bodies) that are stuck. ? Treat any bleeding with medicines or other devices that stop tissue from bleeding. ? Widen (dilate) or stretch narrowed areas of your esophagus and stomach.  The endoscope will be taken out. The procedure may vary among health care providers and hospitals. What happens after the procedure?  Your blood pressure, heart rate, breathing rate, and blood oxygen level will be monitored often until the medicines you were given have worn off.  Do not eat or drink anything until the numbing medicine has worn off and your gag reflex has returned. This information is not intended to replace advice given to you by your health care provider. Make sure you discuss any questions you have with your health care provider. Document Released: 02/17/2005 Document Revised: 03/24/2016 Document Reviewed: 09/10/2015 Elsevier Interactive Patient Education  2018 [employer]. Esophagogastroduodenoscopy, Care After Refer to this sheet in the next few weeks. These instructions provide you with information about caring for yourself after your procedure. Your health care provider may also give you more specific instructions. Your treatment has been planned according to current medical practices, but problems sometimes occur. Call your health care provider if you have any problems or questions after your procedure. What can I expect after the procedure? After the procedure, it is common to have:  A sore throat.  Nausea.  Bloating.  Dizziness.  Fatigue.  Follow these instructions at home:  Do not eat or drink anything until the numbing medicine (local anesthetic)  has worn off and your gag reflex has returned. You will know that the local anesthetic has worn off when you can swallow comfortably.  Do not drive for 24 hours if you received a medicine to help you relax (sedative).  If your health care provider took a tissue sample for testing during the procedure, make sure to get your test results. This is your responsibility. Ask your health care provider or the department performing the test when your results will be ready.  Keep all follow-up visits as told by your health care provider. This is important. Contact a health care provider if:  You cannot stop coughing.  You are not urinating.  You are urinating less than usual. Get help right away if:  You have trouble swallowing.  You cannot eat or drink.  You have throat or chest pain that gets worse.  You are dizzy or light-headed.  You faint.  You have nausea or vomiting.  You have chills.  You have a fever.  You have severe abdominal pain.  You have black, tarry, or bloody stools. This information is not intended to replace advice  given to you by your health care provider. Make sure you discuss any questions you have with your health care provider. Document Released: 10/03/2012 Document Revised: 03/24/2016 Document Reviewed: 09/10/2015 Elsevier Interactive Patient Education  2018 [employer].  Esophageal Dilatation Esophageal dilatation is a procedure to open a blocked or narrowed part of the esophagus. The esophagus is the long tube in your throat that carries food and liquid from your mouth to your stomach. The procedure is also called esophageal dilation. You may need this procedure if you have a buildup of scar tissue in your esophagus that makes it difficult, painful, or even impossible to swallow. This can be caused by gastroesophageal reflux disease (GERD). In rare cases, people need this procedure because they have cancer of the esophagus or a problem with the way food  moves through the esophagus. Sometimes you may need to have another dilatation to enlarge the opening of the esophagus gradually. Tell a health care provider about:  Any allergies you have.  All medicines you are taking, including vitamins, herbs, eye drops, creams, and over-the-counter medicines.  Any problems you or family members have had with anesthetic medicines.  Any blood disorders you have.  Any surgeries you have had.  Any medical conditions you have.  Any antibiotic medicines you are required to take before dental procedures. What are the risks? Generally, this is a safe procedure. However, problems can occur and include:  Bleeding from a tear in the lining of the esophagus.  A hole (perforation) in the esophagus.  What happens before the procedure?  Do not eat or drink anything after midnight on the night before the procedure or as directed by your health care provider.  Ask your health care provider about changing or stopping your regular medicines. This is especially important if you are taking diabetes medicines or blood thinners.  Plan to have someone take you home after the procedure. What happens during the procedure?  You will be given a medicine that makes you relaxed and sleepy (sedative).  A medicine may be sprayed or gargled to numb the back of the throat.  Your health care provider can use various instruments to do an esophageal dilatation. During the procedure, the instrument used will be placed in your mouth and passed down into your esophagus. Options include: ? Simple dilators. This instrument is carefully placed in the esophagus to stretch it. ? Guided wire bougies. In this method, a flexible tube (endoscope) is used to insert a wire into the esophagus. The dilator is passed over this wire to enlarge the esophagus. Then the wire is removed. ? Balloon dilators. An endoscope with a small balloon at the end is passed down into the esophagus. Inflating  the balloon gently stretches the esophagus and opens it up. What happens after the procedure?  Your blood pressure, heart rate, breathing rate, and blood oxygen level will be monitored often until the medicines you were given have worn off.  Your throat may feel slightly sore and will probably still feel numb. This will improve slowly over time.  You will not be allowed to eat or drink until the throat numbness has resolved.  If this is a same-day procedure, you may be allowed to go home once you have been able to drink, urinate, and sit on the edge of the bed without nausea or dizziness.  If this is a same-day procedure, you should have a friend or family member with you for the next 24 hours after the procedure.  This information is not intended to replace advice given to you by your health care provider. Make sure you discuss any questions you have with your health care provider. Document Released: 12/08/2005 Document Revised: 03/24/2016 Document Reviewed: 02/26/2014 Elsevier Interactive Patient Education  [employer].  Colonoscopy, Adult A colonoscopy is an exam to look at the large intestine. It is done to check for problems, such as:  Lumps (tumors).  Growths (polyps).  Swelling (inflammation).  Bleeding.  What happens before the procedure? Eating and drinking Follow instructions from your doctor about eating and drinking. These instructions may include:  A few days before the procedure - follow a low-fiber diet. ? Avoid nuts. ? Avoid seeds. ? Avoid dried fruit. ? Avoid raw fruits. ? Avoid vegetables.  1-3 days before the procedure - follow a clear liquid diet. Avoid liquids that have red or purple dye. Drink only clear liquids, such as: ? Clear broth or bouillon. ? Black coffee or tea. ? Clear juice. ? Clear soft drinks or sports drinks. ? Gelatin dessert. ? Popsicles.  On the day of the procedure - do not eat or drink anything during the 2 hours before the  procedure.  Bowel prep If you were prescribed an oral bowel prep:  Take it as told by your doctor. Starting the day before your procedure, you will need to drink a lot of liquid. The liquid will cause you to poop (have bowel movements) until your poop is almost clear or light green.  If your skin or butt gets irritated from diarrhea, you may: ? Wipe the area with wipes that have medicine in them, such as adult wet wipes with aloe and vitamin E. ? Put something on your skin that soothes the area, such as petroleum jelly.  If you throw up (vomit) while drinking the bowel prep, take a break for up to 60 minutes. Then begin the bowel prep again. If you keep throwing up and you cannot take the bowel prep without throwing up, call your doctor.  General instructions  Ask your doctor about changing or stopping your normal medicines. This is important if you take diabetes medicines or blood thinners.  Plan to have someone take you home from the hospital or clinic. What happens during the procedure?  An IV tube may be put into one of your veins.  You will be given medicine to help you relax (sedative).  To reduce your risk of infection: ? Your doctors will wash their hands. ? Your anal area will be washed with soap.  You will be asked to lie on your side with your knees bent.  Your doctor will get a long, thin, flexible tube ready. The tube will have a camera and a light on the end.  The tube will be put into your anus.  The tube will be gently put into your large intestine.  Air will be delivered into your large intestine to keep it open. You may feel some pressure or cramping.  The camera will be used to take photos.  A small tissue sample may be removed from your body to be looked at under a microscope (biopsy). If any possible problems are found, the tissue will be sent to a lab for testing.  If small growths are found, your doctor may remove them and have them checked for  cancer.  The tube that was put into your anus will be slowly removed. The procedure may vary among doctors and hospitals. What happens after the  procedure?  Your doctor will check on you often until the medicines you were given have worn off.  Do not drive for 24 hours after the procedure.  You may have a small amount of blood in your poop.  You may pass gas.  You may have mild cramps or bloating in your belly (abdomen).  It is up to you to get the results of your procedure. Ask your doctor, or the department performing the procedure, when your results will be ready. This information is not intended to replace advice given to you by your health care provider. Make sure you discuss any questions you have with your health care provider. Document Released: 11/19/2010 Document Revised: 08/17/2016 Document Reviewed: 12/29/2015 Elsevier Interactive Patient Education  2017 [employer]  Colonoscopy, Adult, Care After This sheet gives you information about how to care for yourself after your procedure. Your health care provider may also give you more specific instructions. If you have problems or questions, contact your health care provider. What can I expect after the procedure? After the procedure, it is common to have:  A small amount of blood in your stool for 24 hours after the procedure.  Some gas.  Mild abdominal cramping or bloating.  Follow these instructions at home: General instructions   For the first 24 hours after the procedure: ? Do not drive or use machinery. ? Do not sign important documents. ? Do not drink alcohol. ? Do your regular daily activities at a slower pace than normal. ? Eat soft, easy-to-digest foods. ? Rest often.  Take over-the-counter or prescription medicines only as told by your health care provider.  It is up to you to get the results of your procedure. Ask your health care provider, or the department performing the procedure, when your  results will be ready. Relieving cramping and bloating  Try walking around when you have cramps or feel bloated.  Apply heat to your abdomen as told by your health care provider. Use a heat source that your health care provider recommends, such as a moist heat pack or a heating pad. ? Place a towel between your skin and the heat source. ? Leave the heat on for 20-30 minutes. ? Remove the heat if your skin turns bright red. This is especially important if you are unable to feel pain, heat, or cold. You may have a greater risk of getting burned. Eating and drinking  Drink enough fluid to keep your urine clear or pale yellow.  Resume your normal diet as instructed by your health care provider. Avoid heavy or fried foods that are hard to digest.  Avoid drinking alcohol for as long as instructed by your health care provider. Contact a health care provider if:  You have blood in your stool 2-3 days after the procedure. Get help right away if:  You have more than a small spotting of blood in your stool.  You pass large blood clots in your stool.  Your abdomen is swollen.  You have nausea or vomiting.  You have a fever.  You have increasing abdominal pain that is not relieved with medicine. This information is not intended to replace advice given to you by your health care provider. Make sure you discuss any questions you have with your health care provider. Document Released: 05/31/2004 Document Revised: 07/11/2016 Document Reviewed: 12/29/2015 Elsevier Interactive Patient Education  2018 [employer] Anesthesia is a term that refers to techniques, procedures, and medicines that help a  person stay safe and comfortable during a medical procedure. Monitored anesthesia care, or sedation, is one type of anesthesia. Your anesthesia specialist may recommend sedation if you will be having a procedure that does not require you to be unconscious, such as:  Cataract  surgery.  A dental procedure.  A biopsy.  A colonoscopy.  During the procedure, you may receive a medicine to help you relax (sedative). There are three levels of sedation:  Mild sedation. At this level, you may feel awake and relaxed. You will be able to follow directions.  Moderate sedation. At this level, you will be sleepy. You may not remember the procedure.  Deep sedation. At this level, you will be asleep. You will not remember the procedure.  The more medicine you are given, the deeper your level of sedation will be. Depending on how you respond to the procedure, the anesthesia specialist may change your level of sedation or the type of anesthesia to fit your needs. An anesthesia specialist will monitor you closely during the procedure. Let your health care provider know about:  Any allergies you have.  All medicines you are taking, including vitamins, herbs, eye drops, creams, and over-the-counter medicines.  Any use of steroids (by mouth or as a cream).  Any problems you or family members have had with sedatives and anesthetic medicines.  Any blood disorders you have.  Any surgeries you have had.  Any medical conditions you have, such as sleep apnea.  Whether you are pregnant or may be pregnant.  Any use of cigarettes, alcohol, or street drugs. What are the risks? Generally, this is a safe procedure. However, problems may occur, including:  Getting too much medicine (oversedation).  Nausea.  Allergic reaction to medicines.  Trouble breathing. If this happens, a breathing tube may be used to help with breathing. It will be removed when you are awake and breathing on your own.  Heart trouble.  Lung trouble.  Before the procedure Staying hydrated Follow instructions from your health care provider about hydration, which may include:  Up to 2 hours before the procedure - you may continue to drink clear liquids, such as water, clear fruit juice, black  coffee, and plain tea.  Eating and drinking restrictions Follow instructions from your health care provider about eating and drinking, which may include:  8 hours before the procedure - stop eating heavy meals or foods such as meat, fried foods, or fatty foods.  6 hours before the procedure - stop eating light meals or foods, such as toast or cereal.  6 hours before the procedure - stop drinking milk or drinks that contain milk.  2 hours before the procedure - stop drinking clear liquids.  Medicines Ask your health care provider about:  Changing or stopping your regular medicines. This is especially important if you are taking diabetes medicines or blood thinners.  Taking medicines such as aspirin and ibuprofen. These medicines can thin your blood. Do not take these medicines before your procedure if your health care provider instructs you not to.  Tests and exams  You will have a physical exam.  You may have blood tests done to show: ? How well your kidneys and liver are working. ? How well your blood can clot.  General instructions  Plan to have someone take you home from the hospital or clinic.  If you will be going home right after the procedure, plan to have someone with you for 24 hours.  What happens during the procedure?  Your blood pressure, heart rate, breathing, level of pain and overall condition will be monitored.  An IV tube will be inserted into one of your veins.  Your anesthesia specialist will give you medicines as needed to keep you comfortable during the procedure. This may mean changing the level of sedation.  The procedure will be performed. After the procedure  Your blood pressure, heart rate, breathing rate, and blood oxygen level will be monitored until the medicines you were given have worn off.  Do not drive for 24 hours if you received a sedative.  You may: ? Feel sleepy, clumsy, or nauseous. ? Feel forgetful about what happened after the  procedure. ? Have a sore throat if you had a breathing tube during the procedure. ? Vomit. This information is not intended to replace advice given to you by your health care provider. Make sure you discuss any questions you have with your health care provider. Document Released: 07/13/2005 Document Revised: 03/25/2016 Document Reviewed: 02/07/2016 Elsevier Interactive Patient Education  2018 [employer], Care After These instructions provide you with information about caring for yourself after your procedure. Your health care provider may also give you more specific instructions. Your treatment has been planned according to current medical practices, but problems sometimes occur. Call your health care provider if you have any problems or questions after your procedure. What can I expect after the procedure? After your procedure, it is common to:  Feel sleepy for several hours.  Feel clumsy and have poor balance for several hours.  Feel forgetful about what happened after the procedure.  Have poor judgment for several hours.  Feel nauseous or vomit.  Have a sore throat if you had a breathing tube during the procedure.  Follow these instructions at home: For at least 24 hours after the procedure:   Do not: ? Participate in activities in which you could fall or become injured. ? Drive. ? Use heavy machinery. ? Drink alcohol. ? Take sleeping pills or medicines that cause drowsiness. ? Make important decisions or sign legal documents. ? Take care of children on your own.  Rest. Eating and drinking  Follow the diet that is recommended by your health care provider.  If you vomit, drink water, juice, or soup when you can drink without vomiting.  Make sure you have little or no nausea before eating solid foods. General instructions  Have a responsible adult stay with you until you are awake and alert.  Take over-the-counter and prescription  medicines only as told by your health care provider.  If you smoke, do not smoke without supervision.  Keep all follow-up visits as told by your health care provider. This is important. Contact a health care provider if:  You keep feeling nauseous or you keep vomiting.  You feel light-headed.  You develop a rash.  You have a fever. Get help right away if:  You have trouble breathing. This information is not intended to replace advice given to you by your health care provider. Make sure you discuss any questions you have with your health care provider. Document Released: 02/07/2016 Document Revised: 06/08/2016 Document Reviewed: 02/07/2016 Elsevier Interactive Patient Education  [employer].

## 2018-02-07 ENCOUNTER — Other Ambulatory Visit: Payer: Self-pay

## 2018-02-07 ENCOUNTER — Encounter: Payer: Self-pay | Admitting: *Deleted

## 2018-02-07 ENCOUNTER — Encounter (HOSPITAL_COMMUNITY)
Admission: RE | Admit: 2018-02-07 | Discharge: 2018-02-07 | Disposition: A | Discharge status: Home / Self Care | Payer: Medicaid Other | Source: Ambulatory Visit | Attending: Gastroenterology | Admitting: Gastroenterology

## 2018-02-07 ENCOUNTER — Encounter (HOSPITAL_COMMUNITY): Payer: Self-pay

## 2018-02-07 DIAGNOSIS — R9431 Abnormal electrocardiogram [ECG] [EKG]: Secondary | ICD-10-CM | POA: Diagnosis not present

## 2018-02-07 DIAGNOSIS — K625 Hemorrhage of anus and rectum: Secondary | ICD-10-CM | POA: Diagnosis not present

## 2018-02-07 DIAGNOSIS — Z01818 Encounter for other preprocedural examination: Secondary | ICD-10-CM | POA: Diagnosis not present

## 2018-02-07 DIAGNOSIS — Z01810 Encounter for preprocedural cardiovascular examination: Secondary | ICD-10-CM | POA: Diagnosis not present

## 2018-02-07 DIAGNOSIS — R1310 Dysphagia, unspecified: Secondary | ICD-10-CM | POA: Insufficient documentation

## 2018-02-07 HISTORY — DX: Unspecified osteoarthritis, unspecified site: M19.90

## 2018-02-07 HISTORY — DX: Anxiety disorder, unspecified: F41.9

## 2018-02-07 HISTORY — DX: Depression, unspecified: F32.A

## 2018-02-07 HISTORY — DX: Polyneuropathy, unspecified: G62.9

## 2018-02-07 HISTORY — DX: Major depressive disorder, single episode, unspecified: F32.9

## 2018-02-07 LAB — CBC WITH DIFFERENTIAL/PLATELET
Basophils Absolute: 0 10*3/uL (ref 0.0–0.1)
Basophils Relative: 1 %
Eosinophils Absolute: 0.5 10*3/uL (ref 0.0–0.7)
Eosinophils Relative: 8 %
HCT: 46.1 % (ref 39.0–52.0)
Hemoglobin: 14.9 g/dL (ref 13.0–17.0)
Lymphocytes Relative: 31 %
Lymphs Abs: 1.8 10*3/uL (ref 0.7–4.0)
MCH: 31.7 pg (ref 26.0–34.0)
MCHC: 32.3 g/dL (ref 30.0–36.0)
MCV: 98.1 fL (ref 78.0–100.0)
Monocytes Absolute: 0.3 10*3/uL (ref 0.1–1.0)
Monocytes Relative: 5 %
Neutro Abs: 3.3 10*3/uL (ref 1.7–7.7)
Neutrophils Relative %: 55 %
Platelets: 129 10*3/uL — ABNORMAL LOW (ref 150–400)
RBC: 4.7 MIL/uL (ref 4.22–5.81)
RDW: 13.2 % (ref 11.5–15.5)
WBC: 6 10*3/uL (ref 4.0–10.5)

## 2018-02-07 LAB — COMPREHENSIVE METABOLIC PANEL
ALT: 28 U/L (ref 17–63)
AST: 36 U/L (ref 15–41)
Albumin: 4 g/dL (ref 3.5–5.0)
Alkaline Phosphatase: 85 U/L (ref 38–126)
Anion gap: 12 (ref 5–15)
BUN: 14 mg/dL (ref 6–20)
CO2: 26 mmol/L (ref 22–32)
Calcium: 9.2 mg/dL (ref 8.9–10.3)
Chloride: 104 mmol/L (ref 101–111)
Creatinine, Ser: 0.94 mg/dL (ref 0.61–1.24)
GFR calc Af Amer: >60 mL/min (ref >60)
GFR calc non Af Amer: >60 mL/min (ref >60)
Glucose, Bld: 184 mg/dL — ABNORMAL HIGH (ref 65–99)
Potassium: 4.1 mmol/L (ref 3.5–5.1)
Sodium: 142 mmol/L (ref 135–145)
Total Bilirubin: 1.4 mg/dL — ABNORMAL HIGH (ref 0.3–1.2)
Total Protein: 7.4 g/dL (ref 6.5–8.1)

## 2018-02-07 LAB — PROTIME-INR
INR: 0.94
Prothrombin Time: 12.5 seconds (ref 11.4–15.2)

## 2018-02-07 NOTE — Telephone Encounter (Signed)
I have a clenpiq sample and have printed out instructions for patient when comes by the office

## 2018-02-07 NOTE — Progress Notes (Signed)
   02/07/18 1052  OBSTRUCTIVE SLEEP APNEA  Have you ever been diagnosed with sleep apnea through a sleep study? No  Do you snore loudly (loud enough to be heard through closed doors)?  1  Do you often feel tired, fatigued, or sleepy during the daytime (such as falling asleep during driving or talking to someone)? 1  Has anyone observed you stop breathing during your sleep? 1  Do you have, or are you being treated for high blood pressure? 1  BMI more than 35 kg/m2? 0  Age > 50 (1-yes) 1  Neck circumference greater than:Male 16 inches or larger, Male 17inches or larger? 0  Male Gender (Yes=1) 1  Obstructive Sleep Apnea Score 6  Score 5 or greater  Results sent to PCP

## 2018-02-07 NOTE — Telephone Encounter (Signed)
Kim from Day surgery called- the pt came in today for his pre-op appt, he drank his prep yesterday. She is going to go ahead and do his pre-op and then send him back to the office for another prep and to go over instructions. I spoke with Mindy and she will help him with his instructions when he comes in.

## 2018-02-07 NOTE — Progress Notes (Signed)
Sending FYI to Neil Crouch, PA in Dr. Oneida Alar absence.

## 2018-02-07 NOTE — Telephone Encounter (Signed)
Patient came by the office. I discussed in detail clenpiq instructions multiple times with patient. He stated he understood before he left. Advised patient if he has any questions at all to call us. He will do so.

## 2018-02-07 NOTE — Pre-Procedure Instructions (Signed)
Patient in for PAT and "I thought my procedure was today. I drank all that stuff already". Explained to patient and wife that procedure is scheduled for next Tuesday April 16. Spoke with Dr Gala Romney to see if he could do patient today since he has already done his prep, but his endoscopy and the OR schedule were both full, so he is unable to do this today and Dr Oneida Alar is out of town. [facility] and explained above to her. She states to have patient come back by their office when he is finished at hospital and she will go over instructions again with he and his wife. Patient and wife both verbalize understanding of this.

## 2018-02-13 ENCOUNTER — Encounter (HOSPITAL_COMMUNITY): Payer: Self-pay | Admitting: Anesthesiology

## 2018-02-13 ENCOUNTER — Ambulatory Visit (HOSPITAL_COMMUNITY): Payer: Medicaid Other | Admitting: Anesthesiology

## 2018-02-13 ENCOUNTER — Ambulatory Visit (HOSPITAL_COMMUNITY)
Admission: RE | Admit: 2018-02-13 | Discharge: 2018-02-13 | Disposition: A | Discharge status: Home / Self Care | Payer: Medicaid Other | Source: Ambulatory Visit | Attending: Gastroenterology | Admitting: Gastroenterology

## 2018-02-13 ENCOUNTER — Encounter (HOSPITAL_COMMUNITY)
Admission: RE | Disposition: A | Discharge status: Home / Self Care | Payer: Self-pay | Source: Ambulatory Visit | Attending: Gastroenterology

## 2018-02-13 DIAGNOSIS — K3189 Other diseases of stomach and duodenum: Secondary | ICD-10-CM | POA: Diagnosis not present

## 2018-02-13 DIAGNOSIS — K2960 Other gastritis without bleeding: Secondary | ICD-10-CM | POA: Diagnosis not present

## 2018-02-13 DIAGNOSIS — K921 Melena: Secondary | ICD-10-CM | POA: Insufficient documentation

## 2018-02-13 DIAGNOSIS — D123 Benign neoplasm of transverse colon: Secondary | ICD-10-CM | POA: Diagnosis not present

## 2018-02-13 DIAGNOSIS — Z7984 Long term (current) use of oral hypoglycemic drugs: Secondary | ICD-10-CM | POA: Diagnosis not present

## 2018-02-13 DIAGNOSIS — E7800 Pure hypercholesterolemia, unspecified: Secondary | ICD-10-CM | POA: Diagnosis not present

## 2018-02-13 DIAGNOSIS — K621 Rectal polyp: Secondary | ICD-10-CM | POA: Diagnosis not present

## 2018-02-13 DIAGNOSIS — F329 Major depressive disorder, single episode, unspecified: Secondary | ICD-10-CM | POA: Insufficient documentation

## 2018-02-13 DIAGNOSIS — J45909 Unspecified asthma, uncomplicated: Secondary | ICD-10-CM | POA: Diagnosis not present

## 2018-02-13 DIAGNOSIS — E1151 Type 2 diabetes mellitus with diabetic peripheral angiopathy without gangrene: Secondary | ICD-10-CM | POA: Insufficient documentation

## 2018-02-13 DIAGNOSIS — Z809 Family history of malignant neoplasm, unspecified: Secondary | ICD-10-CM | POA: Insufficient documentation

## 2018-02-13 DIAGNOSIS — K625 Hemorrhage of anus and rectum: Secondary | ICD-10-CM

## 2018-02-13 DIAGNOSIS — K648 Other hemorrhoids: Secondary | ICD-10-CM | POA: Insufficient documentation

## 2018-02-13 DIAGNOSIS — K222 Esophageal obstruction: Secondary | ICD-10-CM

## 2018-02-13 DIAGNOSIS — E1140 Type 2 diabetes mellitus with diabetic neuropathy, unspecified: Secondary | ICD-10-CM | POA: Insufficient documentation

## 2018-02-13 DIAGNOSIS — I10 Essential (primary) hypertension: Secondary | ICD-10-CM | POA: Insufficient documentation

## 2018-02-13 DIAGNOSIS — K2980 Duodenitis without bleeding: Secondary | ICD-10-CM | POA: Diagnosis not present

## 2018-02-13 DIAGNOSIS — Z87891 Personal history of nicotine dependence: Secondary | ICD-10-CM | POA: Diagnosis not present

## 2018-02-13 DIAGNOSIS — R1310 Dysphagia, unspecified: Secondary | ICD-10-CM | POA: Diagnosis present

## 2018-02-13 DIAGNOSIS — K2970 Gastritis, unspecified, without bleeding: Secondary | ICD-10-CM

## 2018-02-13 DIAGNOSIS — Z79899 Other long term (current) drug therapy: Secondary | ICD-10-CM | POA: Insufficient documentation

## 2018-02-13 DIAGNOSIS — Z8249 Family history of ischemic heart disease and other diseases of the circulatory system: Secondary | ICD-10-CM | POA: Insufficient documentation

## 2018-02-13 DIAGNOSIS — K639 Disease of intestine, unspecified: Secondary | ICD-10-CM

## 2018-02-13 DIAGNOSIS — Z7951 Long term (current) use of inhaled steroids: Secondary | ICD-10-CM | POA: Diagnosis not present

## 2018-02-13 DIAGNOSIS — F419 Anxiety disorder, unspecified: Secondary | ICD-10-CM | POA: Diagnosis not present

## 2018-02-13 HISTORY — PX: ESOPHAGOGASTRODUODENOSCOPY (EGD) WITH PROPOFOL: SHX5813

## 2018-02-13 HISTORY — PX: POLYPECTOMY: SHX5525

## 2018-02-13 HISTORY — PX: COLONOSCOPY WITH PROPOFOL: SHX5780

## 2018-02-13 HISTORY — PX: BIOPSY: SHX5522

## 2018-02-13 HISTORY — PX: SAVORY DILATION: SHX5439

## 2018-02-13 LAB — GLUCOSE, CAPILLARY
Glucose-Capillary: 144 mg/dL — ABNORMAL HIGH (ref 65–99)
Glucose-Capillary: 151 mg/dL — ABNORMAL HIGH (ref 65–99)

## 2018-02-13 SURGERY — COLONOSCOPY WITH PROPOFOL
Anesthesia: Monitor Anesthesia Care

## 2018-02-13 MED ORDER — LACTATED RINGERS IV SOLN
INTRAVENOUS | Status: DC | PRN
  Administered 2018-02-13: 10:00:00 via INTRAVENOUS

## 2018-02-13 MED ORDER — MIDAZOLAM HCL 2 MG/2ML IJ SOLN
INTRAMUSCULAR | Status: AC
  Filled 2018-02-13: qty 2

## 2018-02-13 MED ORDER — CHLORHEXIDINE GLUCONATE CLOTH 2 % EX PADS: 6.0000 | MEDICATED_PAD | Freq: Once | CUTANEOUS | Status: DC

## 2018-02-13 MED ORDER — LIDOCAINE VISCOUS 2 % MT SOLN
OROMUCOSAL | Status: AC
  Filled 2018-02-13: qty 15

## 2018-02-13 MED ORDER — PROPOFOL 10 MG/ML IV BOLUS
INTRAVENOUS | Status: AC
  Filled 2018-02-13: qty 40

## 2018-02-13 MED ORDER — GLYCOPYRROLATE 0.2 MG/ML IJ SOLN
INTRAMUSCULAR | Status: DC | PRN
  Administered 2018-02-13: 0.2 mg via INTRAVENOUS

## 2018-02-13 MED ORDER — MIDAZOLAM HCL 5 MG/5ML IJ SOLN
INTRAMUSCULAR | Status: DC | PRN
  Administered 2018-02-13: 2 mg via INTRAVENOUS

## 2018-02-13 MED ORDER — MINERAL OIL PO OIL
TOPICAL_OIL | ORAL | Status: AC
  Filled 2018-02-13: qty 30

## 2018-02-13 MED ORDER — OMEPRAZOLE 20 MG PO CPDR
DELAYED_RELEASE_CAPSULE | ORAL | 11 refills | Status: DC
Start: 2018-02-13 — End: 2019-03-05

## 2018-02-13 MED ORDER — LACTATED RINGERS IV SOLN
INTRAVENOUS | Status: DC
  Administered 2018-02-13: 10:00:00 via INTRAVENOUS

## 2018-02-13 MED ORDER — PROPOFOL 10 MG/ML IV BOLUS
INTRAVENOUS | Status: AC
  Filled 2018-02-13: qty 20

## 2018-02-13 MED ORDER — LIDOCAINE VISCOUS 2 % MT SOLN
15.0000 mL | Freq: Once | OROMUCOSAL | Status: AC
  Administered 2018-02-13: 15 mL via OROMUCOSAL

## 2018-02-13 MED ORDER — GLYCOPYRROLATE 0.2 MG/ML IJ SOLN
INTRAMUSCULAR | Status: AC
  Filled 2018-02-13: qty 1

## 2018-02-13 MED ORDER — PROPOFOL 500 MG/50ML IV EMUL
INTRAVENOUS | Status: DC | PRN
  Administered 2018-02-13: 12:00:00 via INTRAVENOUS
  Administered 2018-02-13: 125 ug/kg/min via INTRAVENOUS
  Administered 2018-02-13 (×4): via INTRAVENOUS

## 2018-02-13 NOTE — Op Note (Signed)
[facility] Patient Name: Eugene Diaz Procedure Date: 02/13/2018 12:09 PM MRN: [id number] Date of Birth: [birth year] Attending MD: Barney Drain MD, MD CSN: [id number] Age: 52 Admit Type: Outpatient Procedure:                Upper GI endoscopy WITH ESOPHAGEAL DILATION AND                            COLD FORCEPS BIOPSY Indications:              Dysphagia-DAILY ETOH USE Providers:                Barney Drain MD, MD, Janeece Riggers, RN, Nelma Rothman,                            Technician Referring MD:             Redmond School, MD Medicines:                Propofol per Anesthesia Complications:            No immediate complications. Estimated Blood Loss:     Estimated blood loss was minimal. Procedure:                Pre-Anesthesia Assessment:                           - Prior to the procedure, a History and Physical                            was performed, and patient medications and                            allergies were reviewed. The patient's tolerance of                            previous anesthesia was also reviewed. The risks                            and benefits of the procedure and the sedation                            options and risks were discussed with the patient.                            All questions were answered, and informed consent                            was obtained. Prior Anticoagulants: The patient has                            taken no previous anticoagulant or antiplatelet                            agents. ASA Grade Assessment: II - A patient with  mild systemic disease. After reviewing the risks                            and benefits, the patient was deemed in                            satisfactory condition to undergo the procedure.                            After obtaining informed consent, the endoscope was                            passed under direct vision. Throughout the                            procedure,  the patient's blood pressure, pulse, and                            oxygen saturations were monitored continuously. The                            [id number] ([id number]) scope was introduced through the                            mouth, and advanced to the second part of duodenum.                            The upper GI endoscopy was somewhat difficult due                            to the patient's body habitus. Successful                            completion of the procedure was aided by performing                            chin lift. The patient tolerated the procedure                            fairly well. Scope In: 12:12:31 PM Scope Out: 12:26:07 PM Total Procedure Duration: 0 hours 13 minutes 36 seconds  Findings:      One benign-appearing, intrinsic moderate (circumferential scarring or       stenosis; an endoscope may pass) stenosis was found. This stenosis       measured 1.4 cm (inner diameter). The stenosis was traversed. A       guidewire was placed and the scope was withdrawn. Dilation was performed       with a Savary dilator with mild resistance at 16 mm and 17 mm.      A few small mucosal papules (nodules) with no bleeding and no stigmata       of recent bleeding were found in the cardia. This was biopsied with a       cold forceps for histology(BTL 4).      Diffuse severe inflammation characterized  by congestion (edema),       erosions and erythema was found in the entire examined stomach. Biopsies       were taken with a cold forceps for Helicobacter pylori testing(BTL 3).      Diffuse mild inflammation characterized by congestion (edema) and       erythema was found in the duodenal bulb.      The second portion of the duodenum was normal. Impression:               - DYSPHAGIA DUE TO Benign-appearing esophageal                            stricture                           - A few mucosal papules (nodules) found in the                            stomach. Biopsied.                            - SEVERE EROSIVE Gastritis AND MILD NON-EROSIVE                            Duodenitis. Moderate Sedation:      Per Anesthesia Care Recommendation:           - Await pathology results.                           - Return to my office in 4 months.                           - Resume previous diet.                           - Continue present medications.                           - Await pathology results.                           - Use Prilosec (omeprazole) 20 mg PO BID.                           - Patient has a contact number available for                            emergencies. The signs and symptoms of potential                            delayed complications were discussed with the                            patient. Return to normal activities tomorrow.                            Written discharge instructions were provided  to the                            patient. Procedure Code(s):        --- Professional ---                           [phone], Esophagogastroduodenoscopy, flexible,                            transoral; with insertion of guide wire followed by                            passage of dilator(s) through esophagus over guide                            wire                           43239, Esophagogastroduodenoscopy, flexible,                            transoral; with biopsy, single or multiple Diagnosis Code(s):        --- Professional ---                           K22.2, Esophageal obstruction                           K31.89, Other diseases of stomach and duodenum                           K29.70, Gastritis, unspecified, without bleeding                           K29.80, Duodenitis without bleeding                           R13.10, Dysphagia, unspecified CPT copyright 2017 American Medical Association. All rights reserved. The codes documented in this report are preliminary and upon coder review may  be revised to meet current compliance  requirements. Barney Drain, MD Barney Drain MD, MD 02/13/2018 12:48:49 PM This report has been signed electronically. Number of Addenda: 0

## 2018-02-13 NOTE — Op Note (Signed)
[facility] Patient Name: Eugene Diaz Procedure Date: 02/13/2018 10:48 AM MRN: [id number] Date of Birth: Jun 12, 1966 Attending MD: Barney Drain MD, MD CSN: [id number] Age: 52 Admit Type: Outpatient Procedure:                Colonoscopy with cold forceps biopsy, cold snare &                            snare cautery polypectomy Indications:              Hematochezia in [place] PLTS(120-130K SINCE                            2015) & DAILY ETOH USE Providers:                Barney Drain MD, MD, Janeece Riggers, RN, Nelma Rothman,                            Technician Referring MD:             Redmond School, MD Medicines:                Propofol per Anesthesia Complications:            No immediate complications. Estimated Blood Loss:     Estimated blood loss was minimal. Procedure:                Pre-Anesthesia Assessment:                           - Prior to the procedure, a History and Physical                            was performed, and patient medications and                            allergies were reviewed. The patient's tolerance of                            previous anesthesia was also reviewed. The risks                            and benefits of the procedure and the sedation                            options and risks were discussed with the patient.                            All questions were answered, and informed consent                            was obtained. Prior Anticoagulants: The patient has                            taken no previous anticoagulant or antiplatelet  agents. ASA Grade Assessment: II - A patient with                            mild systemic disease. After reviewing the risks                            and benefits, the patient was deemed in                            satisfactory condition to undergo the procedure.                            After obtaining informed consent, the colonoscope     was passed under direct vision. Throughout the                            procedure, the patient's blood pressure, pulse, and                            oxygen saturations were monitored continuously. The                            [id number] ([id number]) scope was introduced through                            the anus and advanced to the 7 cm into the ileum.                            The colonoscopy was somewhat difficult due to a                            tortuous colon. Successful completion of the                            procedure was aided by straightening and shortening                            the scope to obtain bowel loop reduction and                            COLOWRAP. The terminal ileum, ileocecal valve,                            appendiceal orifice, and rectum were photographed.                            The patient tolerated the procedure well. The                            quality of the bowel preparation was good. Scope In: 11:40:16 AM Scope Out: 12:06:24 PM Scope Withdrawal Time: 0 hours 23 minutes 35 seconds  Total Procedure Duration: 0 hours 26 minutes 8 seconds  Findings:      The terminal ileum  appeared normal.      A 6 mm polyp was found in the proximal transverse colon. The polyp was       sessile. The polyp was removed with a hot snare. Resection and retrieval       were complete.      Localized mild inflammation characterized by congestion (edema),       erosions and erythema was found in the proximal transverse colon. This       was biopsied with a cold forceps for histology.      Internal hemorrhoids were found during retroflexion. The hemorrhoids       were moderate.      Two sessile polyps were found in the rectum. The polyps were 3 to 4 mm       in size. These polyps were removed with a cold snare. Resection and       retrieval were complete. Impression:               - The examined portion of the ileum was normal.                           - One  6 mm polyp in the proximal transverse colon,                            removed with a hot snare. Resected and retrieved.                           - TWO HYPERPLASTIC APPEARING POLYPS REMOVED FROM                            RECTUM                           - Localized mild inflammation was found in the                            proximal transverse colon secondary to ischemic                            colitis. Biopsied.                           - RECTAL BLEEDING DUE TO Internal hemorrhoids. Moderate Sedation:      Per Anesthesia Care Recommendation:           - Repeat colonoscopy in 5-10 years for surveillance.                           - High fiber diet.                           - Continue present medications.                           - Await pathology results.                           - Return to my office in 4 months.                           -  Patient has a contact number available for                            emergencies. The signs and symptoms of potential                            delayed complications were discussed with the                            patient. Return to normal activities tomorrow.                            Written discharge instructions were provided to the                            patient. Procedure Code(s):        --- Professional ---                           [phone], Colonoscopy, flexible; with removal of                            tumor(s), polyp(s), or other lesion(s) by snare                            technique                           45380, 49, Colonoscopy, flexible; with biopsy,                            single or multiple Diagnosis Code(s):        --- Professional ---                           K64.8, Other hemorrhoids                           D12.3, Benign neoplasm of transverse colon (hepatic                            flexure or splenic flexure)                           K55.9, Vascular disorder of intestine, unspecified                            K92.1, Melena (includes Hematochezia) CPT copyright 2017 American Medical Association. All rights reserved. The codes documented in this report are preliminary and upon coder review may  be revised to meet current compliance requirements. Barney Drain, MD Barney Drain MD, MD 02/13/2018 12:41:23 PM This report has been signed electronically. Number of Addenda: 0

## 2018-02-13 NOTE — H&P (Signed)
Primary Care Physician:  Redmond School, MD Primary Gastroenterologist:  Dr. Oneida Alar  Pre-Procedure History & Physical: HPI:  Eugene Diaz is a 52 y.o. male here for [facility].  Past Medical History:  Diagnosis Date  . Anxiety   . Arthritis   . Asthma   . Depression   . Diabetes mellitus without complication ([place]) 03/6062  . Hypercholesterolemia   . Hypertension   . Neuropathy   . Peripheral vascular disease [facility])     Past Surgical History:  Procedure Laterality Date  . Left arm     Left arm repair (tendon and artery)  . PILONIDAL CYST EXCISION N/A 08/08/2014   Procedure: CYST EXCISION PILONIDAL EXTENSIVE;  Surgeon: Jamesetta So, MD;  Location: [facility];  Service: General;  Laterality: N/A;    Prior to Admission medications   Medication Sig Start Date End Date Taking? Authorizing Provider  albuterol (PROVENTIL HFA;VENTOLIN HFA) 108 (90 BASE) MCG/ACT inhaler Inhale 2 puffs into the lungs every 6 (six) hours as needed for wheezing or shortness of breath. 03/11/14  Yes Black, Lezlie Octave, NP  ALPRAZolam Duanne Moron) 1 MG tablet Take 1 mg by mouth 4 (four) times daily.  12/17/15  Yes [provider]  amitriptyline (ELAVIL) 100 MG tablet Take 100 mg by mouth at bedtime.   Yes [provider]  atorvastatin (LIPITOR) 40 MG tablet Take 40 mg by mouth daily.  12/17/15  Yes [provider]  dapagliflozin propanediol (FARXIGA) 10 MG TABS tablet Take 10 mg by mouth daily.   Yes [provider]  docusate sodium (COLACE) 100 MG capsule Take 100 mg by mouth daily as needed for mild constipation.   Yes [provider]  DULoxetine (CYMBALTA) 60 MG capsule Take 60 mg by mouth daily.  12/11/15  Yes [provider]  furosemide (LASIX) 20 MG tablet Take 20 mg by mouth daily.   Yes [provider]  gabapentin (NEURONTIN) 300 MG capsule 900 am, 900 noon, 1200 qhs Patient taking differently: Take 900-1,200 mg by mouth 3 (three) times daily. 900  am, 900 noon, 1200 qhs 01/25/18  Yes Patel, Donika K, DO  glimepiride (AMARYL) 2 MG tablet Take 2 mg by mouth daily with breakfast.   Yes [provider]  glycopyrrolate (ROBINUL) 1 MG tablet Take 1 mg by mouth daily.  12/17/15  Yes [provider]  lidocaine (XYLOCAINE) 5 % ointment Apply 1 application topically 2 (two) times daily as needed. 03/23/16  Yes Patel, Donika K, DO  lisinopril (PRINIVIL,ZESTRIL) 20 MG tablet Take 20 mg by mouth daily.  12/30/15  Yes [provider]  oxyCODONE HCl 30 MG TABA Take 30 mg by mouth 4 (four) times daily.  12/07/15  Yes [provider]  primidone (MYSOLINE) 50 MG tablet Take 1 tablet (50 mg total) by mouth 2 (two) times daily. 06/08/15  Yes Patel, Donika K, DO  VOLTAREN 1 % GEL Apply 2 g topically daily.  08/19/15  Yes [provider]  zolpidem (AMBIEN) 10 MG tablet Take 10 mg by mouth at bedtime.  12/02/15  Yes [provider]    Allergies as of 12/27/2017  . (No Known Allergies)    Family History  Problem Relation Age of Onset  . Heart attack Mother        Living, 56  . Cancer Father        Deceased  . Healthy Brother   . Healthy Sister   . Colon cancer Neg Hx   . Colon  polyps Neg Hx     Social History   Socioeconomic History  . Marital status: Married    Spouse name: Not on file  . Number of children: Not on file  . Years of education: Not on file  . Highest education level: Not on file  Occupational History  . 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  Social Needs  . Financial resource strain: Not on file  . Food insecurity:    Worry: Not on file    Inability: Not on file  . Transportation needs:    Medical: Not on file    Non-medical: Not on file  Tobacco Use  . Smoking status: Former Smoker    Packs/day: 2.50    Years: 25.00    Pack years: 62.50    Types: Cigarettes    Last attempt to quit: 08/05/1984    Years since quitting: 33.5  . Smokeless  tobacco: Never Used  Substance and Sexual Activity  . Alcohol use: Yes    Alcohol/week: 30.0 oz    Types: 50 Standard drinks or equivalent per week    Comment: Drinks 3-4 beers, liquor 4-6oz x 35 years  . Drug use: Yes    Types: Marijuana    Comment: occasionally   . Sexual activity: Yes  Lifestyle  . Physical activity:    Days per week: Not on file    Minutes per session: Not on file  . Stress: Not on file  Relationships  . Social connections:    Talks on phone: Not on file    Gets together: Not on file    Attends religious service: Not on file    Active member of club or organization: Not on file    Attends meetings of clubs or organizations: Not on file    Relationship status: Not on file  . Intimate partner violence:    Fear of current or ex partner: Not on file    Emotionally abused: Not on file    Physically abused: Not on file    Forced sexual activity: Not on file  Other Topics Concern  . 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 Highest level of education:  8th grade   He lives with wife.  No children.    Review of Systems: See HPI, otherwise negative ROS   Physical Exam: BP (!) 150/84   Pulse 96   Temp 98.4 F (36.9 C) (Oral)   Resp 13   Ht 6\' 2"  (1.88 m)   Wt 297 lb (134.7 kg)   BMI 38.13 kg/m  General:   Alert,  pleasant and cooperative in NAD Head:  Normocephalic and atraumatic. Neck:  Supple; Lungs:  Clear throughout to auscultation.    Heart:  Regular rate and rhythm. Abdomen:  Soft, nontender and nondistended. Normal bowel sounds, without guarding, and without rebound.   Neurologic:  Alert and  oriented x4;  grossly normal neurologically.  Impression/Plan:     DYSPHAGIA/BRBPR  PLAN:  EGD/DIL-TCS TODAY. DISCUSSED PROCEDURE, BENEFITS, & RISKS: < 1% chance of medication reaction, bleeding, perforation, or rupture of spleen/liver.

## 2018-02-13 NOTE — Transfer of Care (Signed)
Immediate Anesthesia Transfer of Care Note  Patient: Eugene Diaz  Procedure(s) Performed: COLONOSCOPY WITH PROPOFOL (N/A ) ESOPHAGOGASTRODUODENOSCOPY (EGD) WITH PROPOFOL (N/A ) SAVORY DILATION (N/A ) POLYPECTOMY BIOPSY  Patient Location: PACU  Anesthesia Type:MAC  Level of Consciousness: awake and patient cooperative  Airway & Oxygen Therapy: Patient Spontanous Breathing and Patient connected to nasal cannula oxygen  Post-op Assessment: Report given to RN, Post -op Vital signs reviewed and stable and Patient moving all extremities  Post vital signs: Reviewed and stable  Last Vitals:  Vitals Value Taken Time  BP    Temp    Pulse    Resp    SpO2      Last Pain:  Vitals:   02/13/18 1017  TempSrc: Oral  PainSc: 0-No pain      Patients Stated Pain Goal: 8 (23/76/28 3151)  Complications: No apparent anesthesia complications

## 2018-02-13 NOTE — Anesthesia Preprocedure Evaluation (Signed)
Anesthesia Evaluation  Patient identified by MRN, date of birth, ID band Patient awake    Reviewed: Allergy & Precautions, H&P , NPO status , Patient's Chart, lab work & pertinent test results, reviewed documented beta blocker date and time   Airway Mallampati: III  TM Distance: >3 FB Neck ROM: full    Dental no notable dental hx. (+) Missing, Chipped, Teeth Intact, Poor Dentition   Pulmonary neg pulmonary ROS, former smoker,    Pulmonary exam normal breath sounds clear to auscultation       Cardiovascular Exercise Tolerance: Good hypertension, negative cardio ROS Normal cardiovascular exam Rhythm:regular Rate:Normal     Neuro/Psych negative neurological ROS  negative psych ROS   GI/Hepatic negative GI ROS, Neg liver ROS,   Endo/Other  negative endocrine ROSdiabetes  Renal/GU negative Renal ROS  negative genitourinary   Musculoskeletal   Abdominal   Peds  Hematology negative hematology ROS (+)   Anesthesia Other Findings Longstanding h/o asthma Chronic pain from peripheral neuropathy NIDDM  Reproductive/Obstetrics negative OB ROS                             Anesthesia Physical Anesthesia Plan  ASA: III  Anesthesia Plan: MAC   Post-op Pain Management:    Induction:   PONV Risk Score and Plan:   Airway Management Planned:   Additional Equipment:   Intra-op Plan:   Post-operative Plan:   Informed Consent: I have reviewed the patients History and Physical, chart, labs and discussed the procedure including the risks, benefits and alternatives for the proposed anesthesia with the patient or authorized representative who has indicated his/her understanding and acceptance.   Dental Advisory Given  Plan Discussed with: CRNA  Anesthesia Plan Comments:         Anesthesia Quick Evaluation

## 2018-02-13 NOTE — Discharge Instructions (Signed)
You had THREE polyps removed. YOU HAVE EROSIVE GASTRITIS AND MILD DUODENTITIS DUE TO ALCOHOL USE. 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 THE STRICTURE IS DUE TO UNCONTROLLED ACID REFLUX.  YOUR RECTAL BLEEDING IS DUE TO HAVE MODERATE INTERNAL HEMORRHOIDS.    TO BETTER CONTROL YOUR ACID REFLUX AND TREAT GASTRITIS/DUODENITIS, START OMEPRAZOLE.  TAKE 30 MINUTES PRIOR TO YOUR FIRST AND LAST MEAL. USE PEPCID OR ZANTAC IF NEEDED TO CONTROL YOUR REFLUX.  FOLLOW A HIGH FIBER/LOW FAT DIET. AVOID ITEMS THAT CAUSE BLOATING.  USE PREPARATION H FOUR TIMES  A DAY IF NEEDED TO RELIEVE RECTAL PAIN/PRESSURE/BLEEDING.  YOU SHOULD COMPLETE AN ABDOMINAL ULTRASOUND TO CHECK FOR CIRRHOSIS.   YOUR BIOPSY RESULTS WILL BE AVAILABLE IN 7 DAYS.   Follow up in 4 mos.  Next colonoscopy in 5-10 years.   ENDOSCOPY Care After Read the instructions outlined below and refer to this sheet in the next week. These discharge instructions provide you with general information on caring for yourself after you leave the hospital. While your treatment has been planned according to the most current medical practices available, unavoidable complications occasionally occur. If you have any problems or questions after discharge, call DR. Marly Schuld, [phone].  ACTIVITY  You may resume your regular activity, but move at a slower pace for the next 24 hours.   Take frequent rest periods for the next 24 hours.   Walking will help get rid of the air and reduce the bloated feeling in your belly (abdomen).   No driving for 24 hours (because of the medicine (anesthesia) used during the test).   You may shower.   Do not sign any important legal documents or operate any machinery for 24 hours (because of the anesthesia used during the test).    NUTRITION  Drink plenty of fluids.   You may resume your normal diet as instructed by your doctor.   Begin with a light meal  and progress to your normal diet. Heavy or fried foods are harder to digest and may make you feel sick to your stomach (nauseated).   Avoid alcoholic beverages for 24 hours or as instructed.    MEDICATIONS  You may resume your normal medications.   WHAT YOU CAN EXPECT TODAY  Some feelings of bloating in the abdomen.   Passage of more gas than usual.   Spotting of blood in your stool or on the toilet paper  .  IF YOU HAD POLYPS REMOVED DURING THE ENDOSCOPY:  Eat a soft diet IF YOU HAVE NAUSEA, BLOATING, ABDOMINAL PAIN, OR VOMITING.    FINDING OUT THE RESULTS OF YOUR TEST Not all test results are available during your visit. DR. Oneida Alar WILL CALL YOU WITHIN 14 DAYS OF YOUR PROCEDUE WITH YOUR RESULTS. Do not assume everything is normal if you have not heard from DR. Akiem Urieta, CALL HER OFFICE AT [phone].  SEEK IMMEDIATE MEDICAL ATTENTION AND CALL THE OFFICE: [phone] IF:  You have more than a spotting of blood in your stool.   Your belly is swollen (abdominal distention).   You are nauseated or vomiting.   You have a temperature over 101F.   You have abdominal pain or discomfort that is severe or gets worse throughout the day.  Gastritis/DUODENITIS  Gastritis is an inflammation (the body's way of reacting to injury and/or infection) of the stomach. DUODENITIS is an inflammation (the body's way of reacting to injury and/or infection) of the FIRST PART OF THE  SMALL INTESTINES. It is often caused by bacterial (germ) infections. It can also be caused BY ASPIRIN, BC/GOODY POWDER'S, (IBUPROFEN) MOTRIN, OR ALEVE (NAPROXEN), chemicals (including alcohol), SPICY FOODS, and medications. This illness may be associated with generalized malaise (feeling tired, not well), UPPER ABDOMINAL STOMACH cramps, and fever. One common bacterial cause of gastritis is an organism known as H. Pylori. This can be treated with antibiotics.   Polyps, Colon  A polyp is extra tissue that grows inside  your body. Colon polyps grow in the large intestine. The large intestine, also called the colon, is part of your digestive system. It is a long, hollow tube at the end of your digestive tract where your body makes and stores stool. Most polyps are not dangerous. They are benign. This means they are not cancerous. But over time, some types of polyps can turn into cancer. Polyps that are smaller than a pea are usually not harmful. But larger polyps could someday become or may already be cancerous. To be safe, doctors remove all polyps and test them.   PREVENTION There is not one sure way to prevent polyps. You might be able to lower your risk of getting them if you:  Eat more fruits and vegetables and less fatty food.   Do not smoke.   Avoid alcohol.   Exercise every day.   Lose weight if you are overweight.   Eating more calcium and folate can also lower your risk of getting polyps. Some foods that are rich in calcium are milk, cheese, and broccoli. Some foods that are rich in folate are chickpeas, kidney beans, and spinach.    Hemorrhoids Hemorrhoids are dilated (enlarged) veins around the rectum. Sometimes clots will form in the veins. This makes them swollen and painful. These are called thrombosed hemorrhoids. Causes of hemorrhoids include:  Constipation.   Straining to have a bowel movement.   HEAVY LIFTING   HOME CARE INSTRUCTIONS  Eat a well balanced diet and drink 6 to 8 glasses of water every day to avoid constipation. You may also use a bulk laxative.   Avoid straining to have bowel movements.   Keep anal area dry and clean.   Do not use a donut shaped pillow or sit on the toilet for long periods. This increases blood pooling and pain.   Move your bowels when your body has the urge; this will require less straining and will decrease pain and pressure.

## 2018-02-13 NOTE — Anesthesia Postprocedure Evaluation (Signed)
Anesthesia Post Note  Patient: [name]  Procedure(s) Performed: COLONOSCOPY WITH PROPOFOL (N/A ) ESOPHAGOGASTRODUODENOSCOPY (EGD) WITH PROPOFOL (N/A ) SAVORY DILATION (N/A ) POLYPECTOMY BIOPSY  Patient location during evaluation: PACU Anesthesia Type: MAC Level of consciousness: awake and alert Pain management: pain level controlled Vital Signs Assessment: post-procedure vital signs reviewed and stable Respiratory status: spontaneous breathing, nonlabored ventilation and respiratory function stable Cardiovascular status: blood pressure returned to baseline Postop Assessment: no apparent nausea or vomiting Anesthetic complications: no     Last Vitals:  Vitals:   02/13/18 1017  BP: (!) 150/84  Pulse: 96  Resp: 13  Temp: 36.9 C    Last Pain:  Vitals:   02/13/18 1017  TempSrc: Oral  PainSc: 0-No pain                 Eugene Diaz

## 2018-02-14 ENCOUNTER — Telehealth: Payer: Self-pay | Admitting: Gastroenterology

## 2018-02-14 ENCOUNTER — Other Ambulatory Visit: Payer: Self-pay

## 2018-02-14 DIAGNOSIS — D696 Thrombocytopenia, unspecified: Secondary | ICD-10-CM

## 2018-02-14 NOTE — Telephone Encounter (Signed)
REVIEWED-NO ADDITIONAL RECOMMENDATIONS. 

## 2018-02-14 NOTE — Telephone Encounter (Signed)
Pt had colonoscopy and EGD yesterday by SF. He called today asking to speak with the nurse. I told him all nurses were with patients and I would have to take a message. Pt is complaining about not having any energy since having procedure yesterday and was that normal. Please call him at [phone]

## 2018-02-14 NOTE — Telephone Encounter (Signed)
I spoke to Eugene Diaz and he said he has not had any energy today. Had EGD yesterday with Propofol. I told him it was probably coming from the medications given to him for the procedure, and sometimes they affect some people more than others. I told him I would let Dr. Oneida Alar know, but for him to call back tomorrow and let us know if he is feeling better. No other complaints, just no energy.

## 2018-02-14 NOTE — Telephone Encounter (Signed)
LMOM to call.

## 2018-02-15 ENCOUNTER — Telehealth: Payer: Self-pay | Admitting: Gastroenterology

## 2018-02-15 ENCOUNTER — Encounter: Payer: Self-pay | Admitting: Gastroenterology

## 2018-02-15 NOTE — Telephone Encounter (Signed)
Please call pt. He had ONE SIMPLE ADENOMA AND ONEHYPERPLASTIC POLYP AND ONE INFLAMMATORY POLYP REMOVED.  His stomach Bx shows mild gastritis AND UNCONTROLLED REFLUX.    TO BETTER CONTROL YOUR ACID REFLUX AND TREAT GASTRITIS/DUODENITIS, START OMEPRAZOLE.  TAKE 30 MINUTES PRIOR TO YOUR FIRST AND LAST MEAL.   USE PEPCID OR ZANTAC IF NEEDED TO CONTROL YOUR REFLUX.  FOLLOW A HIGH FIBER/LOW FAT DIET. AVOID ITEMS THAT CAUSE BLOATING.  USE PREPARATION H FOUR TIMES  A DAY IF NEEDED TO RELIEVE RECTAL PAIN/PRESSURE/BLEEDING.  YOU SHOULD COMPLETE AN ABDOMINAL ULTRASOUND TO CHECK FOR CIRRHOSIS.    Follow up in 4 mos E30 ETOH GASTRITIS/DYSPHAGIA.  Next colonoscopy in 5-10 years.

## 2018-02-19 ENCOUNTER — Encounter (HOSPITAL_COMMUNITY): Payer: Self-pay | Admitting: Gastroenterology

## 2018-02-19 NOTE — Telephone Encounter (Signed)
OV made and reminder in epic °

## 2018-02-19 NOTE — Telephone Encounter (Signed)
Patient already scheduled for 02/20/18 previously by Tretha Sciara.

## 2018-02-19 NOTE — Telephone Encounter (Signed)
PT is aware of results and plan. Ok to schedule the [place].

## 2018-02-20 ENCOUNTER — Ambulatory Visit (HOSPITAL_COMMUNITY)
Admission: RE | Admit: 2018-02-20 | Discharge: 2018-02-20 | Disposition: A | Discharge status: Home / Self Care | Payer: Medicaid Other | Source: Ambulatory Visit | Attending: Gastroenterology | Admitting: Gastroenterology

## 2018-02-20 DIAGNOSIS — D696 Thrombocytopenia, unspecified: Secondary | ICD-10-CM | POA: Insufficient documentation

## 2018-02-20 DIAGNOSIS — R161 Splenomegaly, not elsewhere classified: Secondary | ICD-10-CM | POA: Diagnosis not present

## 2018-02-27 NOTE — Progress Notes (Signed)
LMOM to call.

## 2018-02-28 NOTE — Progress Notes (Signed)
LMOM to call and mailing a letter to call also.

## 2018-05-21 ENCOUNTER — Emergency Department (HOSPITAL_COMMUNITY)
Admission: EM | Admit: 2018-05-21 | Discharge: 2018-05-21 | Disposition: A | Discharge status: Home / Self Care | Payer: Medicaid Other | Attending: Emergency Medicine | Admitting: Emergency Medicine

## 2018-05-21 ENCOUNTER — Encounter (HOSPITAL_COMMUNITY): Payer: Self-pay | Admitting: Emergency Medicine

## 2018-05-21 ENCOUNTER — Emergency Department (HOSPITAL_COMMUNITY): Payer: Medicaid Other

## 2018-05-21 DIAGNOSIS — R400 Somnolence: Secondary | ICD-10-CM | POA: Diagnosis not present

## 2018-05-21 DIAGNOSIS — I10 Essential (primary) hypertension: Secondary | ICD-10-CM | POA: Insufficient documentation

## 2018-05-21 DIAGNOSIS — E119 Type 2 diabetes mellitus without complications: Secondary | ICD-10-CM | POA: Insufficient documentation

## 2018-05-21 DIAGNOSIS — J45909 Unspecified asthma, uncomplicated: Secondary | ICD-10-CM | POA: Insufficient documentation

## 2018-05-21 DIAGNOSIS — R4182 Altered mental status, unspecified: Secondary | ICD-10-CM | POA: Diagnosis present

## 2018-05-21 DIAGNOSIS — Z87891 Personal history of nicotine dependence: Secondary | ICD-10-CM | POA: Diagnosis not present

## 2018-05-21 DIAGNOSIS — Z79899 Other long term (current) drug therapy: Secondary | ICD-10-CM | POA: Insufficient documentation

## 2018-05-21 DIAGNOSIS — Z7984 Long term (current) use of oral hypoglycemic drugs: Secondary | ICD-10-CM | POA: Diagnosis not present

## 2018-05-21 LAB — BLOOD GAS, ARTERIAL
Acid-Base Excess: 0.2 mmol/L (ref 0.0–2.0)
Bicarbonate: 23.6 mmol/L (ref 20.0–28.0)
Drawn by: 270161
O2 Content: 1 L/min
O2 Saturation: 93.7 %
Patient temperature: 37.4
pCO2 arterial: 53.4 mmHg — ABNORMAL HIGH (ref 32.0–48.0)
pH, Arterial: 7.305 — ABNORMAL LOW (ref 7.350–7.450)
pO2, Arterial: 78.3 mmHg — ABNORMAL LOW (ref 83.0–108.0)

## 2018-05-21 LAB — CBC WITH DIFFERENTIAL/PLATELET
Basophils Absolute: 0.1 10*3/uL (ref 0.0–0.1)
Basophils Relative: 1 %
Eosinophils Absolute: 0.4 10*3/uL (ref 0.0–0.7)
Eosinophils Relative: 6 %
HCT: 41.1 % (ref 39.0–52.0)
Hemoglobin: 14 g/dL (ref 13.0–17.0)
Lymphocytes Relative: 34 %
Lymphs Abs: 2.5 10*3/uL (ref 0.7–4.0)
MCH: 33.5 pg (ref 26.0–34.0)
MCHC: 34.1 g/dL (ref 30.0–36.0)
MCV: 98.3 fL (ref 78.0–100.0)
Monocytes Absolute: 0.5 10*3/uL (ref 0.1–1.0)
Monocytes Relative: 7 %
Neutro Abs: 3.9 10*3/uL (ref 1.7–7.7)
Neutrophils Relative %: 52 %
Platelets: 146 10*3/uL — ABNORMAL LOW (ref 150–400)
RBC: 4.18 MIL/uL — ABNORMAL LOW (ref 4.22–5.81)
RDW: 13.6 % (ref 11.5–15.5)
WBC: 7.4 10*3/uL (ref 4.0–10.5)

## 2018-05-21 LAB — COMPREHENSIVE METABOLIC PANEL
ALT: 29 U/L (ref 0–44)
AST: 41 U/L (ref 15–41)
Albumin: 4 g/dL (ref 3.5–5.0)
Alkaline Phosphatase: 81 U/L (ref 38–126)
Anion gap: 8 (ref 5–15)
BUN: 11 mg/dL (ref 6–20)
CO2: 28 mmol/L (ref 22–32)
Calcium: 8.9 mg/dL (ref 8.9–10.3)
Chloride: 98 mmol/L (ref 98–111)
Creatinine, Ser: 1.17 mg/dL (ref 0.61–1.24)
GFR calc Af Amer: >60 mL/min (ref >60)
GFR calc non Af Amer: >60 mL/min (ref >60)
Glucose, Bld: 393 mg/dL — ABNORMAL HIGH (ref 70–99)
Potassium: 3.8 mmol/L (ref 3.5–5.1)
Sodium: 134 mmol/L — ABNORMAL LOW (ref 135–145)
Total Bilirubin: 0.9 mg/dL (ref 0.3–1.2)
Total Protein: 7.6 g/dL (ref 6.5–8.1)

## 2018-05-21 LAB — URINALYSIS, ROUTINE W REFLEX MICROSCOPIC
Bacteria, UA: NONE SEEN
Bilirubin Urine: NEGATIVE
Glucose, UA: >=500 mg/dL — AB
Hgb urine dipstick: NEGATIVE
Ketones, ur: NEGATIVE mg/dL
Leukocytes, UA: NEGATIVE
Nitrite: NEGATIVE
Protein, ur: NEGATIVE mg/dL
Specific Gravity, Urine: 1.029 (ref 1.005–1.030)
pH: 5 (ref 5.0–8.0)

## 2018-05-21 LAB — AMMONIA: Ammonia: 21 umol/L (ref 9–35)

## 2018-05-21 LAB — TROPONIN I: Troponin I: <0.03 ng/mL (ref <0.03)

## 2018-05-21 MED ORDER — ALBUTEROL SULFATE HFA 108 (90 BASE) MCG/ACT IN AERS
2.0000 | INHALATION_SPRAY | Freq: Once | RESPIRATORY_TRACT | Status: AC
  Administered 2018-05-21: 2 via RESPIRATORY_TRACT
  Filled 2018-05-21: qty 6.7

## 2018-05-21 NOTE — ED Triage Notes (Signed)
Pt brought in by ems from [place] center where his wife was a pt.  The pt drove them their, but became altered at some point.  Given solumedrol IM by Larene Pickett as well as 2 nebulizers for wheezing.  Pt diaphoretic with audible wheezing noted.

## 2018-05-21 NOTE — ED Notes (Signed)
Dr Wilson Singer gave po fluids.

## 2018-05-21 NOTE — ED Provider Notes (Signed)
[facility] EMERGENCY DEPARTMENT Provider Note   CSN: [id number] Arrival date & time: 05/21/18  1559     History   Chief Complaint Chief Complaint  Patient presents with  . Altered Mental Status    HPI Eugene Diaz is a 52 y.o. male.  HPI   52 year old male presenting for evaluation of decreased mental status.  He is actually an appointment for his wife earlier today.  Staff noted that patient was very drowsy he was diaphoretic.  Patient himself has no acute complaints.  He does acknowledge that he is very tired based on her percent sure why.  He states that he otherwise feels fine.  He denies any acute pain.  He does have some wheezing on exam but states that he has a history of asthma and denies any dyspnea.  Past Medical History:  Diagnosis Date  . Anxiety   . Arthritis   . Asthma   . Depression   . Diabetes mellitus without complication ([place]) 11/6551  . Hypercholesterolemia   . Hypertension   . Neuropathy   . Peripheral vascular disease [facility])     Patient Active Problem List   Diagnosis Date Noted  . Gastritis, erosive   . Dysphagia, idiopathic 12/27/2017  . Rectal bleeding 12/27/2017  . Intention tremor 06/03/2016  . Left knee pain 12/04/2015  . 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 Diabetic neuropathy ([place]) 10/08/2014  . Chest pain, rule out acute myocardial infarction 03/10/2014  . Thrombocytopenia ([place]) 03/10/2014  . ETOH abuse 03/10/2014  . Chest pain 03/10/2014  . Diabetes mellitus without complication ([place])   . Hypertension   . Hypercholesterolemia     Past Surgical History:  Procedure Laterality Date  . BIOPSY  02/13/2018   Procedure: BIOPSY;  Surgeon: Danie Binder, MD;  Location: [facility] ENDO [facility];  Service: Endoscopy;;  transverse colon biopsy, gastric biopsy  . COLONOSCOPY WITH PROPOFOL N/A 02/13/2018   Procedure: COLONOSCOPY WITH PROPOFOL;  Surgeon: Danie Binder, MD;  Location: [facility] ENDO SUITE;  Service: Endoscopy;  Laterality:  N/A;  11:15am  . ESOPHAGOGASTRODUODENOSCOPY (EGD) WITH PROPOFOL N/A 02/13/2018   Procedure: ESOPHAGOGASTRODUODENOSCOPY (EGD) WITH PROPOFOL;  Surgeon: Danie Binder, MD;  Location: [facility] ENDO SUITE;  Service: Endoscopy;  Laterality: N/A;  . Left arm     Left arm repair (tendon and artery)  . PILONIDAL CYST EXCISION N/A 08/08/2014   Procedure: CYST EXCISION PILONIDAL EXTENSIVE;  Surgeon: Jamesetta So, MD;  Location: [facility] ORS;  Service: General;  Laterality: N/A;  . POLYPECTOMY  02/13/2018   Procedure: POLYPECTOMY;  Surgeon: Danie Binder, MD;  Location: [facility] END[facility];  Service: Endoscopy;;  transverse colon polyp hs, rectal polyps times 2  . SAVORY DILATION N/A 02/13/2018   Procedure: SAVORY DILATION;  Surgeon: Danie Binder, MD;  Location: [facility] END[facility];  Service: Endoscopy;  Laterality: N/A;        Home Medications    Prior to Admission medications   Medication Sig Start Date End Date Taking? Authorizing Provider  albuterol (PROVENTIL HFA;VENTOLIN HFA) 108 (90 BASE) MCG/ACT inhaler Inhale 2 puffs into the lungs every 6 (six) hours as needed for wheezing or shortness of breath. 03/11/14   Black, Lezlie Octave, NP  ALPRAZolam Duanne Moron) 1 MG tablet Take 1 mg by mouth 4 (four) times daily.  12/17/15   [provider]  amitriptyline (ELAVIL) 100 MG tablet Take 100 mg by mouth at bedtime.    [provider]  atorvastatin (LIPITOR) 40 MG tablet  Take 40 mg by mouth daily.  12/17/15   [provider]  dapagliflozin propanediol (FARXIGA) 10 MG TABS tablet Take 10 mg by mouth daily.    [provider]  docusate sodium (COLACE) 100 MG capsule Take 100 mg by mouth daily as needed for mild constipation.    [provider]  DULoxetine (CYMBALTA) 60 MG capsule Take 60 mg by mouth daily.  12/11/15   [provider]  furosemide (LASIX) 20 MG tablet Take 20 mg by mouth daily.    [provider]  gabapentin (NEURONTIN) 300 MG capsule 900 am, 900 noon, 1200  qhs Patient taking differently: Take 900-1,200 mg by mouth 3 (three) times daily. 900 am, 900 noon, 1200 qhs 01/25/18   Patel, Donika K, DO  glimepiride (AMARYL) 2 MG tablet Take 2 mg by mouth daily with breakfast.    [provider]  glycopyrrolate (ROBINUL) 1 MG tablet Take 1 mg by mouth daily.  12/17/15   [provider]  lidocaine (XYLOCAINE) 5 % ointment Apply 1 application topically 2 (two) times daily as needed. 03/23/16   Patel, Arvin Collard K, DO  lisinopril (PRINIVIL,ZESTRIL) 20 MG tablet Take 20 mg by mouth daily.  12/30/15   [provider]  omeprazole (PRILOSEC) 20 MG capsule 1 PO 30 mins prior to breakfast and supper 02/13/18   Fields, Marga Melnick, MD  oxyCODONE HCl 30 MG TABA Take 30 mg by mouth 4 (four) times daily.  12/07/15   [provider]  primidone (MYSOLINE) 50 MG tablet Take 1 tablet (50 mg total) by mouth 2 (two) times daily. 06/08/15   Patel, Donika K, DO  VOLTAREN 1 % GEL Apply 2 g topically daily.  08/19/15   [provider]  zolpidem (AMBIEN) 10 MG tablet Take 10 mg by mouth at bedtime.  12/02/15   [provider]    Family History Family History  Problem Relation Age of Onset  . Heart attack Mother        Living, 83  . Cancer Father        Deceased  . Healthy Brother   . Healthy Sister   . Colon cancer Neg Hx   . Colon polyps Neg Hx     Social History Social History   Tobacco Use  . Smoking status: Former Smoker    Packs/day: 2.50    Years: 25.00    Pack years: 62.50    Types: Cigarettes    Last attempt to quit: 08/05/1984    Years since quitting: 33.8  . Smokeless tobacco: Never Used  Substance Use Topics  . Alcohol use: Yes    Alcohol/week: 30.0 oz    Types: 50 Standard drinks or equivalent per week    Comment: Drinks 3-4 beers, liquor 4-6oz x 35 years  . Drug use: Yes    Types: Marijuana    Comment: occasionally      Allergies   Patient has no known allergies.   Review of Systems Review of  Systems  All systems reviewed and negative, other than as noted in HPI.  Physical Exam Updated Vital Signs BP (!) 137/97 (BP Location: Left Arm)   Pulse (!) 101   Temp 99.3 F (37.4 C) (Oral)   Resp 10   Ht 6' (1.829 m)   Wt 136.1 kg (300 lb)   SpO2 100%   BMI 40.69 kg/m   Physical Exam  Constitutional: He is oriented to person, place, and time. He appears well-developed and well-nourished. No  distress.  Laying in bed.  Drowsy.  Responds to questions appropriately. Diaphoretic.   HENT:  Head: Normocephalic and atraumatic.  Eyes: Conjunctivae are normal. Right eye exhibits no discharge. Left eye exhibits no discharge.  Neck: Neck supple.  Cardiovascular: Normal rate, regular rhythm and normal heart sounds. Exam reveals no gallop and no friction rub.  No murmur heard. Pulmonary/Chest: Effort normal and breath sounds normal. No respiratory distress.  Abdominal: Soft. He exhibits no distension. There is no tenderness.  Musculoskeletal: He exhibits no edema or tenderness.  Neurological: He is oriented to person, place, and time. No cranial nerve deficit. He exhibits normal muscle tone. Coordination normal.  drowsy  Skin: Skin is warm and dry.  Psychiatric: He has a normal mood and affect. His behavior is normal. Thought content normal.  Nursing note and vitals reviewed.    ED Treatments / Results  Labs (all labs ordered are listed, but only abnormal results are displayed) Labs Reviewed  BLOOD GAS, ARTERIAL - Abnormal; Notable for the following components:      Result Value   pH, Arterial 7.305 (*)    pCO2 arterial 53.4 (*)    pO2, Arterial 78.3 (*)    All other components within normal limits  CBC WITH DIFFERENTIAL/PLATELET - Abnormal; Notable for the following components:   RBC 4.18 (*)    Platelets 146 (*)    All other components within normal limits  COMPREHENSIVE METABOLIC PANEL - Abnormal; Notable for the following components:   Sodium 134 (*)    Glucose, Bld 393  (*)    All other components within normal limits  URINALYSIS, ROUTINE W REFLEX MICROSCOPIC - Abnormal; Notable for the following components:   Glucose, UA >=500 (*)    All other components within normal limits  AMMONIA  TROPONIN I    EKG None  Radiology No results found.  Procedures Procedures (including critical care time)  Medications Ordered in ED Medications - No data to display   Initial Impression / Assessment and Plan / ED Course  I have reviewed the triage vital signs and the nursing notes.  Pertinent labs & imaging results that were available during my care of the patient were reviewed by me and considered in my medical decision making (see chart for details).     52yM with drowsiness. Mild elevation in pco2 but not to the point that I I think it explains all his symptoms.  He has no complaints otherwise.  He is afebrile.  Work-up fairly unremarkable otherwise.  I doubt emergent condition.  Final Clinical Impressions(s) / ED Diagnoses   Final diagnoses:  Drowsiness    ED Discharge Orders    None       Virgel Manifold, MD 05/27/18 [phone]

## 2018-05-28 ENCOUNTER — Other Ambulatory Visit: Payer: Self-pay

## 2018-05-28 ENCOUNTER — Emergency Department (HOSPITAL_COMMUNITY)
Admission: EM | Admit: 2018-05-28 | Discharge: 2018-05-28 | Disposition: A | Discharge status: Home / Self Care | Payer: Medicaid Other | Attending: Emergency Medicine | Admitting: Emergency Medicine

## 2018-05-28 ENCOUNTER — Encounter (HOSPITAL_COMMUNITY): Payer: Self-pay

## 2018-05-28 DIAGNOSIS — E1165 Type 2 diabetes mellitus with hyperglycemia: Secondary | ICD-10-CM | POA: Insufficient documentation

## 2018-05-28 DIAGNOSIS — X58XXXA Exposure to other specified factors, initial encounter: Secondary | ICD-10-CM | POA: Diagnosis not present

## 2018-05-28 DIAGNOSIS — Y939 Activity, unspecified: Secondary | ICD-10-CM | POA: Diagnosis not present

## 2018-05-28 DIAGNOSIS — E1140 Type 2 diabetes mellitus with diabetic neuropathy, unspecified: Secondary | ICD-10-CM | POA: Insufficient documentation

## 2018-05-28 DIAGNOSIS — Y999 Unspecified external cause status: Secondary | ICD-10-CM | POA: Insufficient documentation

## 2018-05-28 DIAGNOSIS — S99922A Unspecified injury of left foot, initial encounter: Secondary | ICD-10-CM | POA: Diagnosis present

## 2018-05-28 DIAGNOSIS — Z79899 Other long term (current) drug therapy: Secondary | ICD-10-CM | POA: Diagnosis not present

## 2018-05-28 DIAGNOSIS — Y929 Unspecified place or not applicable: Secondary | ICD-10-CM | POA: Diagnosis not present

## 2018-05-28 DIAGNOSIS — Z87891 Personal history of nicotine dependence: Secondary | ICD-10-CM | POA: Insufficient documentation

## 2018-05-28 DIAGNOSIS — I10 Essential (primary) hypertension: Secondary | ICD-10-CM | POA: Insufficient documentation

## 2018-05-28 DIAGNOSIS — S90822A Blister (nonthermal), left foot, initial encounter: Secondary | ICD-10-CM | POA: Insufficient documentation

## 2018-05-28 DIAGNOSIS — J45909 Unspecified asthma, uncomplicated: Secondary | ICD-10-CM | POA: Insufficient documentation

## 2018-05-28 DIAGNOSIS — R739 Hyperglycemia, unspecified: Secondary | ICD-10-CM

## 2018-05-28 LAB — COMPREHENSIVE METABOLIC PANEL
ALT: 25 U/L (ref 0–44)
AST: 24 U/L (ref 15–41)
Albumin: 3.8 g/dL (ref 3.5–5.0)
Alkaline Phosphatase: 77 U/L (ref 38–126)
Anion gap: 11 (ref 5–15)
BUN: 9 mg/dL (ref 6–20)
CO2: 23 mmol/L (ref 22–32)
Calcium: 9.2 mg/dL (ref 8.9–10.3)
Chloride: 103 mmol/L (ref 98–111)
Creatinine, Ser: 0.69 mg/dL (ref 0.61–1.24)
GFR calc Af Amer: >60 mL/min (ref >60)
GFR calc non Af Amer: >60 mL/min (ref >60)
Glucose, Bld: 351 mg/dL — ABNORMAL HIGH (ref 70–99)
Potassium: 3.7 mmol/L (ref 3.5–5.1)
Sodium: 137 mmol/L (ref 135–145)
Total Bilirubin: 0.6 mg/dL (ref 0.3–1.2)
Total Protein: 7.1 g/dL (ref 6.5–8.1)

## 2018-05-28 LAB — CBC WITH DIFFERENTIAL/PLATELET
Basophils Absolute: 0.1 10*3/uL (ref 0.0–0.1)
Basophils Relative: 1 %
Eosinophils Absolute: 0.4 10*3/uL (ref 0.0–0.7)
Eosinophils Relative: 9 %
HCT: 39.9 % (ref 39.0–52.0)
Hemoglobin: 13.6 g/dL (ref 13.0–17.0)
Lymphocytes Relative: 48 %
Lymphs Abs: 2 10*3/uL (ref 0.7–4.0)
MCH: 33.3 pg (ref 26.0–34.0)
MCHC: 34.1 g/dL (ref 30.0–36.0)
MCV: 97.6 fL (ref 78.0–100.0)
Monocytes Absolute: 0.2 10*3/uL (ref 0.1–1.0)
Monocytes Relative: 4 %
Neutro Abs: 1.6 10*3/uL — ABNORMAL LOW (ref 1.7–7.7)
Neutrophils Relative %: 38 %
Platelets: 132 10*3/uL — ABNORMAL LOW (ref 150–400)
RBC: 4.09 MIL/uL — ABNORMAL LOW (ref 4.22–5.81)
RDW: 13.8 % (ref 11.5–15.5)
WBC: 4.2 10*3/uL (ref 4.0–10.5)

## 2018-05-28 MED ORDER — BACITRACIN ZINC 500 UNIT/GM EX OINT
TOPICAL_OINTMENT | CUTANEOUS | Status: AC
  Administered 2018-05-28: 1
  Filled 2018-05-28: qty 0.9

## 2018-05-28 MED ORDER — BACITRACIN ZINC 500 UNIT/GM EX OINT
1.0000 "application " | TOPICAL_OINTMENT | Freq: Once | CUTANEOUS | Status: AC
  Administered 2018-05-28: 1 via TOPICAL

## 2018-05-28 NOTE — Discharge Instructions (Addendum)
Apply antibiotic ointment to the wound daily, make sure to wash and clean the wound daily, keep a dressing on it to prevent any dirt or debris getting in the wound, monitor for fevers chills worsening symptoms

## 2018-05-28 NOTE — ED Notes (Signed)
Pt sleeping in wheelchair.  

## 2018-05-28 NOTE — ED Triage Notes (Signed)
Pt in for sore to left heel. Pt reports he noticed it Friday. Reports sugar has been high. Noted to have white blister like area to heel

## 2018-05-28 NOTE — ED Provider Notes (Signed)
[facility] EMERGENCY DEPARTMENT Provider Note   CSN: [id number] Arrival date & time: 05/28/18  1338     History   Chief Complaint Chief Complaint  Patient presents with  . Sore    HPI Eugene Diaz is a 52 y.o. male.  HPI Patient presents to the emergency room for evaluation of a sore in his left heel.  Patient states he noticed it on Friday.  Today he noticed that the area looked like it was containing fluid and it looked like a large blister.  He became concerned about an infection.  Patient does have diabetes and knows that he has to be careful about that.  He denies any fevers or chills.  No known recent injuries.  Patient has been walking a little bit and did recently have to get some new shoes.  Patient has diabetes and did not take any of his medications today. Past Medical History:  Diagnosis Date  . Anxiety   . Arthritis   . Asthma   . Depression   . Diabetes mellitus without complication ([name]) 02/8849  . Hypercholesterolemia   . Hypertension   . Neuropathy   . Peripheral vascular disease [facility])     Patient Active Problem List   Diagnosis Date Noted  . Gastritis, erosive   . Dysphagia, idiopathic 12/27/2017  . Rectal bleeding 12/27/2017  . Intention tremor 06/03/2016  . Left knee pain 12/04/2015  . 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 Diabetic neuropathy ([place]) 10/08/2014  . Chest pain, rule out acute myocardial infarction 03/10/2014  . Thrombocytopenia ([place]) 03/10/2014  . ETOH abuse 03/10/2014  . Chest pain 03/10/2014  . Diabetes mellitus without complication ([place])   . Hypertension   . Hypercholesterolemia     Past Surgical History:  Procedure Laterality Date  . BIOPSY  02/13/2018   Procedure: BIOPSY;  Surgeon: Danie Binder, MD;  Location: [facility] ENDO SUITE;  Service: Endoscopy;;  transverse colon biopsy, gastric biopsy  . COLONOSCOPY WITH PROPOFOL N/A 02/13/2018   Procedure: COLONOSCOPY WITH PROPOFOL;  Surgeon: Danie Binder, MD;   Location: [facility] ENDO SUITE;  Service: Endoscopy;  Laterality: N/A;  11:15am  . ESOPHAGOGASTRODUODENOSCOPY (EGD) WITH PROPOFOL N/A 02/13/2018   Procedure: ESOPHAGOGASTRODUODENOSCOPY (EGD) WITH PROPOFOL;  Surgeon: Danie Binder, MD;  Location: [facility] ENDO SUITE;  Service: Endoscopy;  Laterality: N/A;  . Left arm     Left arm repair (tendon and artery)  . PILONIDAL CYST EXCISION N/A 08/08/2014   Procedure: CYST EXCISION PILONIDAL EXTENSIVE;  Surgeon: Jamesetta So, MD;  Location: [facility] ORS;  Service: General;  Laterality: N/A;  . POLYPECTOMY  02/13/2018   Procedure: POLYPECTOMY;  Surgeon: Danie Binder, MD;  Location: [facility] ENDO SUITE;  Service: Endoscopy;;  transverse colon polyp hs, rectal polyps times 2  . SAVORY DILATION N/A 02/13/2018   Procedure: SAVORY DILATION;  Surgeon: Danie Binder, MD;  Location: [facility] ENDO SUITE;  Service: Endoscopy;  Laterality: N/A;        Home Medications    Prior to Admission medications   Medication Sig Start Date End Date Taking? Authorizing Provider  albuterol (PROVENTIL HFA;VENTOLIN HFA) 108 (90 BASE) MCG/ACT inhaler Inhale 2 puffs into the lungs every 6 (six) hours as needed for wheezing or shortness of breath. 03/11/14   Black, Lezlie Octave, NP  ALPRAZolam Duanne Moron) 1 MG tablet Take 1 mg by mouth 4 (four) times daily.  12/17/15   [provider]  amitriptyline (ELAVIL) 100 MG tablet Take 100 mg by mouth  at bedtime.    [provider]  atorvastatin (LIPITOR) 40 MG tablet Take 40 mg by mouth every evening.  12/17/15   [provider]  cephALEXin (KEFLEX) 500 MG capsule Take 500 mg by mouth 4 (four) times daily. 7 day course starting on 05/14/2018    [provider]  docusate sodium (COLACE) 100 MG capsule Take 100 mg by mouth daily as needed for mild constipation.    [provider]  DULoxetine (CYMBALTA) 60 MG capsule Take 60 mg by mouth daily.  12/11/15   [provider]  furosemide (LASIX) 20 MG tablet Take 20 mg by mouth  daily as needed for fluid.     [provider]  gabapentin (NEURONTIN) 300 MG capsule 900 am, 900 noon, 1200 qhs Patient taking differently: Take 900-1,200 mg by mouth 3 (three) times daily. 900mg  in the morning and at noon, then take 1200mg  at bedtime 01/25/18   Narda Amber K, DO  glimepiride (AMARYL) 2 MG tablet Take 2 mg by mouth daily with breakfast.    [provider]  glycopyrrolate (ROBINUL) 1 MG tablet Take 1 mg by mouth 2 (two) times daily.  12/17/15   [provider]  lidocaine (XYLOCAINE) 5 % ointment Apply 1 application topically 2 (two) times daily as needed. 03/23/16   Patel, Arvin Collard K, DO  lisinopril (PRINIVIL,ZESTRIL) 20 MG tablet Take 20 mg by mouth daily.  12/30/15   [provider]  omeprazole (PRILOSEC) 20 MG capsule 1 PO 30 mins prior to breakfast and supper Patient taking differently: Take 20 mg by mouth 2 (two) times daily before a meal. 1 PO 30 mins prior to breakfast and supper 02/13/18   Fields, Marga Melnick, MD  oxyCODONE HCl 30 MG TABA Take 30 mg by mouth every 4 (four) hours as needed (for pain).  12/07/15   [provider]  primidone (MYSOLINE) 50 MG tablet Take 1 tablet (50 mg total) by mouth 2 (two) times daily. 06/08/15   Patel, Donika K, DO  VOLTAREN 1 % GEL Apply 2 g topically 4 (four) times daily.  08/19/15   [provider]  zolpidem (AMBIEN) 10 MG tablet Take 10 mg by mouth at bedtime.  12/02/15   [provider]    Family History Family History  Problem Relation Age of Onset  . Heart attack Mother        Living, 65  . Cancer Father        Deceased  . Healthy Brother   . Healthy Sister   . Colon cancer Neg Hx   . Colon polyps Neg Hx     Social History Social History   Tobacco Use  . Smoking status: Former Smoker    Packs/day: 2.50    Years: 25.00    Pack years: 62.50    Types: Cigarettes    Last attempt to quit: 08/05/1984    Years since quitting: 33.8  . Smokeless tobacco: Never Used    Substance Use Topics  . Alcohol use: Yes    Alcohol/week: 30.0 oz    Types: 50 Standard drinks or equivalent per week    Comment: Drinks 3-4 beers, liquor 4-6oz x 35 years  . Drug use: Yes    Types: Marijuana    Comment: occasionally      Allergies   Patient has no known allergies.   Review of Systems Review of Systems  All other systems reviewed and are negative.    Physical Exam Updated Vital Signs  BP 135/78 (BP Location: Right Arm)   Pulse 75   Temp 97.9 F (36.6 C) (Oral)   Resp 16   SpO2 96%   Physical Exam  Constitutional: He appears well-developed and well-nourished. No distress.  HENT:  Head: Normocephalic and atraumatic.  Right Ear: External ear normal.  Left Ear: External ear normal.  Eyes: Conjunctivae are normal. Right eye exhibits no discharge. Left eye exhibits no discharge. No scleral icterus.  Neck: Neck supple. No tracheal deviation present.  Cardiovascular: Normal rate.  Pulmonary/Chest: Effort normal. No stridor. No respiratory distress.  Abdominal: He exhibits no distension.  Musculoskeletal: He exhibits no edema.  3.5 cm circular blister , no surrounding erythema, small amount of bruising noted around the edge,   Neurological: He is alert. Cranial nerve deficit: no gross deficits.  Skin: Skin is warm and dry. No rash noted.  Psychiatric: He has a normal mood and affect.  Nursing note and vitals reviewed.    ED Treatments / Results  Labs (all labs ordered are listed, but only abnormal results are displayed) Labs Reviewed  COMPREHENSIVE METABOLIC PANEL - Abnormal; Notable for the following components:      Result Value   Glucose, Bld 351 (*)    All other components within normal limits  CBC WITH DIFFERENTIAL/PLATELET - Abnormal; Notable for the following components:   RBC 4.09 (*)    Platelets 132 (*)    Neutro Abs 1.6 (*)    All other components within normal limits    EKG None  Radiology No results  found.  Procedures .Marland KitchenIncision and Drainage Date/Time: 05/28/2018 4:21 PM Performed by: Dorie Rank, MD Authorized by: Dorie Rank, MD   Consent:    Consent obtained:  Verbal   Consent given by:  Patient   Risks discussed:  Bleeding, incomplete drainage, pain and damage to other organs   Alternatives discussed:  No treatment Universal protocol:    Procedure explained and questions answered to patient or proxy's satisfaction: yes     Relevant documents present and verified: yes     Test results available and properly labeled: yes     Imaging studies available: yes     Required blood products, implants, devices, and special equipment available: yes     Site/side marked: yes     Immediately prior to procedure a time out was called: yes     Patient identity confirmed:  Verbally with patient Location:    Type:  Fluid collection (blister)   Size:  3.5cm   Location: medial left heel. Pre-procedure details:    Skin preparation:  Betadine Anesthesia (see MAR for exact dosages):    Anesthesia method:  None Procedure type:    Complexity:  Simple Procedure details:    Incision type: portion of blister unroofed.   Incision depth:  Dermal   Scalpel blade:  11   Drainage:  Serosanguinous   Drainage amount:  Moderate   Wound treatment:  Wound left open   Packing materials:  None Post-procedure details:    Patient tolerance of procedure:  Tolerated well, no immediate complications Comments:     abx ointment and dressing applied   (including critical care time)  Medications Ordered in ED Medications  bacitracin ointment 1 application (1 application Topical Given 05/28/18 1623)  bacitracin 500 UNIT/GM ointment (1 application  Given 12/30/58 1624)     Initial Impression / Assessment and Plan / ED Course  I have reviewed the triage vital signs and the nursing notes.  Pertinent labs &  imaging results that were available during my care of the patient were reviewed by me and considered in  my medical decision making (see chart for details).    Patient presented to the emergency room for evaluation of a sore on his left heel.  Patient appears to have a blister.  There is no evidence of ulceration.  No evidence of cellulitis.  I unroofed the blister for the patient's comfort.  I explained to him to wash his foot daily and apply antibiotic ointment and a dressing.  He needs to try to avoid any pressure on his heel.  Patient also has a known history of diabetes.  His laboratory tests are notable for hyperglycemia but no other complications.  Patient admits that he has not taken his medications today.  Soon as he gets home he will take them.  I stressed the importance of watching his blood sugars and making sure to take his medications regularly.  Final Clinical Impressions(s) / ED Diagnoses   Final diagnoses:  Blister of left heel, initial encounter  Hyperglycemia    ED Discharge Orders    None       Dorie Rank, MD 05/28/18 [phone]

## 2018-06-11 ENCOUNTER — Encounter: Payer: Self-pay | Admitting: Neurology

## 2018-06-11 ENCOUNTER — Ambulatory Visit (INDEPENDENT_AMBULATORY_CARE_PROVIDER_SITE_OTHER): Payer: Medicaid Other | Admitting: Neurology

## 2018-06-11 VITALS — BP 130/88 | HR 99 | Ht 74.0 in | Wt 279.1 lb

## 2018-06-11 DIAGNOSIS — G621 Alcoholic polyneuropathy: Secondary | ICD-10-CM

## 2018-06-11 DIAGNOSIS — G252 Other specified forms of tremor: Secondary | ICD-10-CM

## 2018-06-11 MED ORDER — PRIMIDONE 50 MG PO TABS: 50.0000 mg | ORAL_TABLET | Freq: Two times a day (BID) | ORAL | 11 refills | Status: DC

## 2018-06-11 MED ORDER — GABAPENTIN 300 MG PO CAPS: ORAL_CAPSULE | ORAL | 11 refills | Status: DC

## 2018-06-11 NOTE — Patient Instructions (Signed)
Continue your medications as you are taking  I will see you back in 1 year

## 2018-06-11 NOTE — Progress Notes (Signed)
Follow-up Visit   Date: 06/11/18   Eugene Diaz MRN: [id number] DOB: 1966/09/11   Interim History: Eugene Diaz is a 52 y.o. left-handed Caucasian male with diabetes mellitus, hypertension, asthma, and chronic back pain, and prior tobacco use (62.5 pack year) returning to the clinic for follow-up of painful alcoholic neuropathy.  The patient was accompanied to the clinic by wife who also provides collateral information.    History of present illness: Patient was not seeing a medical doctor until earlier in 2015 when he was diagnosed with uncontrolled diabetes and hypertension. His also has chronic symptoms including low back pain, knee pain, and burning paresthesias of the feet for at least 10 years.  It started in his toes and over the years has progressed to involve the soles of the feet.  He also has similar tingling sensation involving the mid-calf and tips of the hands.   Additionally, he also admits to a long history of alcohol use which started in his early teens.  He currently drinks 3-4 beers and liquor 4-6oz nightly for at least the past 35 years.  On weekend, he may drink up to a 12 pack of beer.  After diagnosing him with alcoholic and diabetic neuropathy, he stopped liquor and red apple ale, but continues to drink about 6-pack a night.  Of note, he injured his medial forearm at the age of ~33 while at work and sustained nerve and tendon injury. Since then, his last two fingers have been weak and medial hand has been numb.    He takes primidone 50mg  BID for tremors.   UPDATE 06/11/2018:  He is here to 9 month visit.  There has been no worsening of neuropathy or tremors since his last visit. He is tolerating all his medications well.  At his last visit, I instructed him to try to taper gabapentin due to concern that medication may be causing his swelling, but he did not do this and swelling has improved.   His neuropathic pain is well-controlled on his current regimen.  He  continues to drink 6 beers/nightly.  He developed a blister on his left heel for which he went to the ER on 7/29 and has a follow-up with Ruth.   Medications:  Current Outpatient Medications on File Prior to Visit  Medication Sig Dispense Refill  . albuterol (PROVENTIL HFA;VENTOLIN HFA) 108 (90 BASE) MCG/ACT inhaler Inhale 2 puffs into the lungs every 6 (six) hours as needed for wheezing or shortness of breath. 1 Inhaler 2  . ALPRAZolam (XANAX) 1 MG tablet Take 1 mg by mouth 4 (four) times daily.   4  . amitriptyline (ELAVIL) 100 MG tablet Take 100 mg by mouth at bedtime.    Marland Kitchen atorvastatin (LIPITOR) 40 MG tablet Take 40 mg by mouth every evening.   10  . canagliflozin (INVOKANA) 300 MG TABS tablet     . cephALEXin (KEFLEX) 500 MG capsule Take 500 mg by mouth 4 (four) times daily. 7 day course starting on 05/14/2018    . docusate sodium (COLACE) 100 MG capsule Take 100 mg by mouth daily as needed for mild constipation.    . DULoxetine (CYMBALTA) 60 MG capsule Take 60 mg by mouth daily.   10  . furosemide (LASIX) 20 MG tablet Take 20 mg by mouth daily as needed for fluid.     Marland Kitchen gabapentin (NEURONTIN) 300 MG capsule 900 am, 900 noon, 1200 qhs (Patient taking differently: Take 900-1,200 mg by mouth  3 (three) times daily. 900mg  in the morning and at noon, then take 1200mg  at bedtime) 300 capsule 5  . glimepiride (AMARYL) 2 MG tablet Take 2 mg by mouth daily with breakfast.    . glycopyrrolate (ROBINUL) 1 MG tablet Take 1 mg by mouth 2 (two) times daily.   2  . lidocaine (XYLOCAINE) 5 % ointment Apply 1 application topically 2 (two) times daily as needed. 100 g 5  . lisinopril (PRINIVIL,ZESTRIL) 20 MG tablet Take 20 mg by mouth daily.   4  . omeprazole (PRILOSEC) 20 MG capsule 1 PO 30 mins prior to breakfast and supper (Patient taking differently: Take 20 mg by mouth 2 (two) times daily before a meal. 1 PO 30 mins prior to breakfast and supper) 60 capsule 11  . oxyCODONE HCl 30 MG TABA Take  30 mg by mouth every 4 (four) hours as needed (for pain).   0  . primidone (MYSOLINE) 50 MG tablet Take 1 tablet (50 mg total) by mouth 2 (two) times daily. 60 tablet 5  . VOLTAREN 1 % GEL Apply 2 g topically 4 (four) times daily.     Marland Kitchen zolpidem (AMBIEN) 10 MG tablet Take 10 mg by mouth at bedtime.   3   No current facility-administered medications on file prior to visit.     Allergies: No Known Allergies  Review of Systems:  CONSTITUTIONAL: No fevers, chills, night sweats, or weight loss.  EYES: No visual changes or eye pain ENT: No hearing changes.  No history of nose bleeds.   RESPIRATORY: No cough, wheezing and shortness of breath.   CARDIOVASCULAR: Negative for chest pain, and palpitations.   GI: Negative for abdominal discomfort, blood in stools or black stools.  No recent change in bowel habits.   GU:  No history of incontinence.   MUSCLOSKELETAL: +history of joint pain or swelling.  No myalgias.   SKIN: Negative for lesions, rash, and itching.   ENDOCRINE: Negative for cold or heat intolerance, polydipsia or goiter.   PSYCH:  No depression or anxiety symptoms.   NEURO: As Above.   Vital Signs:  BP 130/88   Pulse 99   Ht 6\' 2"  (1.88 m)   Wt 279 lb 2 oz (126.6 kg)   SpO2 94%   BMI 35.84 kg/m   General Medical Exam:   General:  Well appearing, comfortable  Eyes/ENT: see cranial nerve examination.   Neck: No masses appreciated.  Full range of motion without tenderness.  No carotid bruits. Respiratory:  Clear to auscultation, good air entry bilaterally.   Cardiac:  Regular rate and rhythm, no murmur.   Ext:  Left heel is wrapped due to wound  Neurological Exam: MENTAL STATUS including orientation to time, place, person is intact.   CRANIAL NERVES:  Pupils equal round and reactive to light.  Face is symmetric.  MOTOR:  Motor strength is 5/5 proximally, 4/5 distally in the hands, 3/5 left interosseus hand muscles.  Mild-moderate atrophy of right instrinsic hand  muscles. No tremor.  MSRs:  Reflexes are 2+/4 throughout, except absent at the Achilles bilaterally.  SENSORY: Sensation to all modalities is reduced at mid-calf and absent in the feet.  COORDINATION/GAIT:  Antalgic wide-based gait, assisted with cane.  Data: Lab 10/07/2014:  Vitamin B1 8, vitamin B6 9.1, folate 7.5, copper 78, ceruloplasmin, zinc 81, vitamin B12 370, TSH 0.906, SPEP/UPEP with IFE no M protein  EMG 11/13/2014 of the right upper and lower extremity: 1.  The electrophysiologic findings are  most consistent with a moderately severe generalized sensorimotor polyneuropathy, demyelinating and axon loss in type, affecting the right side. A superimposed right median neuropathy at the wrist and ulnar neuropathy at the elbow is likely. 2. Chronic L3-L4 radiculopathy affecting the right lower extremity, mild in degree electrically. 3. Chronic S1 radiculopathy affecting the right lower extremity, mild in degree electrically.   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   Continue gabapentin 900mg  in the morning, 900mg  in the afternoon, and 1200mg  at bedtime.    Continue lidocaine ointment to feet.  Continue lidocaine ointment.  He is unable to afford Lyrica.    Alcohol induced tremor, stable.   Well-controlled on primidone 50mg  twice daily Encouraged to reduce alcohol, he is not interested and continues to drink 6 beers daily  Right meralgia paresthetica, stable.    Chronic pain syndrome with low back pain, knee pain, and arthralgias impairing his gait, followed by his PCP - He also takes Cymbalta 60mg , nortriptyline 75mg  daily, and roxicodone   Return to clinic in 1 year  Thank you for allowing me to participate in patient's care.  If I can answer any additional questions, I would be pleased to do so.    Sincerely,    Ruqayyah Lute K. [name], DO

## 2018-06-15 ENCOUNTER — Encounter: Payer: Self-pay | Admitting: Podiatry

## 2018-06-15 ENCOUNTER — Ambulatory Visit: Payer: Medicaid Other | Admitting: Podiatry

## 2018-06-15 ENCOUNTER — Ambulatory Visit (INDEPENDENT_AMBULATORY_CARE_PROVIDER_SITE_OTHER): Payer: Medicaid Other

## 2018-06-15 DIAGNOSIS — E1142 Type 2 diabetes mellitus with diabetic polyneuropathy: Secondary | ICD-10-CM

## 2018-06-15 DIAGNOSIS — L97422 Non-pressure chronic ulcer of left heel and midfoot with fat layer exposed: Secondary | ICD-10-CM | POA: Diagnosis not present

## 2018-06-15 DIAGNOSIS — E11621 Type 2 diabetes mellitus with foot ulcer: Secondary | ICD-10-CM

## 2018-06-15 DIAGNOSIS — L97421 Non-pressure chronic ulcer of left heel and midfoot limited to breakdown of skin: Secondary | ICD-10-CM

## 2018-06-15 DIAGNOSIS — G621 Alcoholic polyneuropathy: Secondary | ICD-10-CM

## 2018-06-15 NOTE — Progress Notes (Signed)
Subjective:  Patient ID: Eugene Diaz, male    DOB: [birth year],  MRN: [id number]  Chief Complaint  Patient presents with  . Diabetes    diabetic foot exaM  . Foot Ulcer    left heel - started as a blister    52 y.o. male presents with the above complaint.  States that it started 4 to 6 weeks ago.  Endorses diabetes.  Has been using antibiotic cream.  Denies pain at the area.  States that the blister started from wearing different shoes. Has stayed the same over the past few weeks  Review of Systems: Negative except as noted in the HPI. Denies N/V/F/Ch.  Past Medical History:  Diagnosis Date  . Anxiety   . Arthritis   . Asthma   . Depression   . Diabetes mellitus without complication ([place]) 07/3817  . Hypercholesterolemia   . Hypertension   . Neuropathy   . Peripheral vascular disease ([place])     Current Outpatient Medications:  .  albuterol (PROVENTIL HFA;VENTOLIN HFA) 108 (90 BASE) MCG/ACT inhaler, Inhale 2 puffs into the lungs every 6 (six) hours as needed for wheezing or shortness of breath., Disp: 1 Inhaler, Rfl: 2 .  ALPRAZolam (XANAX) 1 MG tablet, Take 1 mg by mouth 4 (four) times daily. , Disp: , Rfl: 4 .  amitriptyline (ELAVIL) 100 MG tablet, Take 100 mg by mouth at bedtime., Disp: , Rfl:  .  atorvastatin (LIPITOR) 40 MG tablet, Take 40 mg by mouth every evening. , Disp: , Rfl: 10 .  canagliflozin (INVOKANA) 300 MG TABS tablet, , Disp: , Rfl:  .  cephALEXin (KEFLEX) 500 MG capsule, Take 500 mg by mouth 4 (four) times daily. 7 day course starting on 05/14/2018, Disp: , Rfl:  .  docusate sodium (COLACE) 100 MG capsule, Take 100 mg by mouth daily as needed for mild constipation., Disp: , Rfl:  .  DULoxetine (CYMBALTA) 60 MG capsule, Take 60 mg by mouth daily. , Disp: , Rfl: 10 .  furosemide (LASIX) 20 MG tablet, Take 20 mg by mouth daily as needed for fluid. , Disp: , Rfl:  .  gabapentin (NEURONTIN) 300 MG capsule, Take 900mg  in the morning and at noon, then take 1200mg  at  bedtime, Disp: 300 capsule, Rfl: 11 .  glimepiride (AMARYL) 2 MG tablet, Take 2 mg by mouth daily with breakfast., Disp: , Rfl:  .  glycopyrrolate (ROBINUL) 1 MG tablet, Take 1 mg by mouth 2 (two) times daily. , Disp: , Rfl: 2 .  lidocaine (XYLOCAINE) 5 % ointment, Apply 1 application topically 2 (two) times daily as needed., Disp: 100 g, Rfl: 5 .  lisinopril (PRINIVIL,ZESTRIL) 20 MG tablet, Take 20 mg by mouth daily. , Disp: , Rfl: 4 .  omeprazole (PRILOSEC) 20 MG capsule, 1 PO 30 mins prior to breakfast and supper (Patient taking differently: Take 20 mg by mouth 2 (two) times daily before a meal. 1 PO 30 mins prior to breakfast and supper), Disp: 60 capsule, Rfl: 11 .  oxyCODONE HCl 30 MG TABA, Take 30 mg by mouth every 4 (four) hours as needed (for pain). , Disp: , Rfl: 0 .  primidone (MYSOLINE) 50 MG tablet, Take 1 tablet (50 mg total) by mouth 2 (two) times daily., Disp: 60 tablet, Rfl: 11 .  VOLTAREN 1 % GEL, Apply 2 g topically 4 (four) times daily. , Disp: , Rfl:  .  zolpidem (AMBIEN) 10 MG tablet, Take 10 mg by mouth at bedtime. , Disp: ,  Rfl: 3  Social History   Tobacco Use  Smoking Status Former Smoker  . Packs/day: 2.50  . Years: 25.00  . Pack years: 62.50  . Types: Cigarettes  . Last attempt to quit: 08/05/1984  . Years since quitting: 33.8  Smokeless Tobacco Never Used    No Known Allergies Objective:  There were no vitals filed for this visit. There is no height or weight on file to calculate BMI. Constitutional Well developed. Well nourished.  Vascular Dorsalis pedis pulses palpable bilaterally. Posterior tibial pulses palpable bilaterally. Capillary refill normal to all digits.  No cyanosis or clubbing noted. Pedal hair growth normal.  Neurologic Normal speech. Oriented to person, place, and time. Epicritic sensation to light touch grossly present bilaterally.  Dermatologic Nails well groomed and normal in appearance. No skin lesions. L heel ulcer 4.5.x5  post-debridement. Significant redundant skin pre-debridement. No warmth, erythema, signs of infection. No active drainage.   Orthopedic: Normal joint ROM without pain or crepitus bilaterally. No visible deformities. No bony tenderness.   Radiographs: X-rays taken reviewed.  No underlying osseous erosion. Assessment:   1. 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 Alcoholic peripheral neuropathy ([place])   3. DM type 2 with diabetic peripheral neuropathy ([place])    Plan:  Patient was evaluated and treated and all questions answered.  L Heel Ulcer -Debridement performed as below -X-rays taken as above -Dressed with medihoney and DSD -Educated on self-care for the ulceration  Procedure: Excisional Debridement of Wound Rationale: Removal of non-viable soft tissue from the wound to promote healing.  Anesthesia: None Pre-Debridement Wound Measurements: 2 cm x 2 cm x 0.1 cm  Post-Debridement Wound Measurements: 4.5 cm x 5 cm x 0.1 cm  Type of Debridement: Excisional Tissue Removed: Non-viable soft tissue Depth of Debridement: subq Instrumentation: 312 blade, tissue nipper Technique: Sharp excisional debridement to bleeding, viable wound base.  Dressing: Dry, sterile, compression dressing. Disposition: Patient tolerated procedure well. Patient to return in 1 week for follow-up.  Return in about 2 weeks (around 06/29/2018) for Wound Care, Left heel.

## 2018-06-18 ENCOUNTER — Ambulatory Visit (INDEPENDENT_AMBULATORY_CARE_PROVIDER_SITE_OTHER): Payer: Medicaid Other | Admitting: Nurse Practitioner

## 2018-06-18 ENCOUNTER — Encounter: Payer: Self-pay | Admitting: Nurse Practitioner

## 2018-06-18 VITALS — BP 145/91 | HR 99 | Temp 97.2°F | Ht 74.0 in | Wt 282.4 lb

## 2018-06-18 DIAGNOSIS — K2960 Other gastritis without bleeding: Secondary | ICD-10-CM

## 2018-06-18 DIAGNOSIS — K760 Fatty (change of) liver, not elsewhere classified: Secondary | ICD-10-CM | POA: Diagnosis not present

## 2018-06-18 DIAGNOSIS — R1310 Dysphagia, unspecified: Secondary | ICD-10-CM

## 2018-06-18 NOTE — Progress Notes (Signed)
CC'D TO PCP °

## 2018-06-18 NOTE — Patient Instructions (Signed)
1. Continue taking your current medications. 2. As we discussed, you should start cutting back on your alcohol intake with a goal of stopping all alcohol.  We are available to help with resources if you need them.  Your primary care is available for resources as well. 3. Return for follow-up in 6 months. 4. Call us if you have any questions or concerns.  At [facility] we value your feedback. You may receive a survey about your visit today. Please share your experience as we strive to create trusting relationships with our patients to provide genuine, compassionate, quality care.  It was great to meet you today!  I hope you have a great summer!!

## 2018-06-18 NOTE — Assessment & Plan Note (Signed)
Dysphasia significantly improved, no recurring symptoms.  Continue to monitor, follow-up in 6 months.

## 2018-06-18 NOTE — Assessment & Plan Note (Signed)
Gastritis and GERD symptoms improved on twice daily PPI.  Rare breakthrough related to dietary indiscretions.  Recommended he avoid trigger substances.  Recommend he continue this, follow-up in 6 months.

## 2018-06-18 NOTE — Progress Notes (Signed)
Referring Provider: Redmond School, MD Primary Care Physician:  Redmond School, MD Primary GI:  Dr. Oneida Alar  Chief Complaint  Patient presents with  . Dysphagia    f/u. No issues  . Gastroesophageal Reflux    doing okay    HPI:   Eugene Diaz is a 52 y.o. male who presents for post procedure follow-up.  The patient was last seen in our office 12/27/2017 for dysphasia, alcohol abuse, rectal bleeding.  Recommended colonoscopy and endoscopy.  Colonoscopy completed 02/13/2018 which found normal ileum, 6 mm polyp in the proximal transverse colon, 2 hyperplastic appearing polyps removed from the rectum, localized mild inflammation in the proximal transverse colon secondary to ischemic colitis status post biopsy.  Rectal bleeding likely due to internal hemorrhoids.  Surgical pathology found the polyps to be a mix of tubular adenoma and inflammatory polyp. Recommended repeat colonoscopy in 5-10 years.  Follow-up in 4 months.  He completed the same day found dysphasia due to benign-appearing esophageal stricture status post dilation, a few mucosal papules in the stomach status post biopsy, severe erosive gastritis and mild nonerosive duodenitis.  Surgical pathology found the papules to be esophageal squamous and cardia mucosa with severely active nonspecific carditis with ulceration and associated hyperplastic changes and reactive epithelial atypia which is negative for intestinal metaplasia, dysplasia, or malignancy. The gastric biopsies were found to be mild nonspecific reactive gastropathy and focal intestinal metaplasia, negative for dysplasia, negative for H. pylori.  Recommended Prilosec 20 mg twice daily, follow-up in 4 months.  Can also use Pepcid or Zantac if needed.  Finally, recommended abdominal ultrasound to check for cirrhosis.  Ultrasound of the abdomen completed 02/20/2018 due to decreased platelet count and alcohol abuse which found liver consistent with hepatic steatosis and no  focal abnormality.  Moderate splenomegaly.  Recommended weight loss, avoid alcohol.  Today he states dysphagia is improved, GERD is improved. Some breakthrough rarely with dietary discrimination. Denies abdominal pain, N/V, hematochezia, melena (although doesn't often doesn't look at his stools much.) he did have an episode of drowsiness and AMS 05/21/18 and was seen in the ER. Mild elevation of pCO2, otherwise unremarkable workup and was treated with an inhaler and d/c to home. Denies chest pain, dyspnea, dizziness, lightheadedness. Denies any other upper or lower GI symptoms.  Has a heel blister and was seen in the ER for I&D, following up with foot and ankle specialists for follow-up.  Past Medical History:  Diagnosis Date  . Anxiety   . Arthritis   . Asthma   . Depression   . Diabetes mellitus without complication ([place]) 02/6386  . Hypercholesterolemia   . Hypertension   . Neuropathy   . Peripheral vascular disease [facility])     Past Surgical History:  Procedure Laterality Date  . BIOPSY  02/13/2018   Procedure: BIOPSY;  Surgeon: Danie Binder, MD;  Location: [facility];  Service: Endoscopy;;  transverse colon biopsy, gastric biopsy  . COLONOSCOPY WITH PROPOFOL N/A 02/13/2018   Procedure: COLONOSCOPY WITH PROPOFOL;  Surgeon: Danie Binder, MD;  Location: [facility];  Service: Endoscopy;  Laterality: N/A;  11:15am  . ESOPHAGOGASTRODUODENOSCOPY (EGD) WITH PROPOFOL N/A 02/13/2018   Procedure: ESOPHAGOGASTRODUODENOSCOPY (EGD) WITH PROPOFOL;  Surgeon: Danie Binder, MD;  Location: [facility];  Service: Endoscopy;  Laterality: N/A;  . Left arm     Left arm repair (tendon and artery)  . PILONIDAL CYST EXCISION N/A 08/08/2014   Procedure: CYST EXCISION PILONIDAL EXTENSIVE;  Surgeon: Elta Guadeloupe  Lowella Petties, MD;  Location: [facility] ORS;  Service: General;  Laterality: N/A;  . POLYPECTOMY  02/13/2018   Procedure: POLYPECTOMY;  Surgeon: Danie Binder, MD;  Location: [facility];  Service:  Endoscopy;;  transverse colon polyp hs, rectal polyps times 2  . SAVORY DILATION N/A 02/13/2018   Procedure: SAVORY DILATION;  Surgeon: Danie Binder, MD;  Location: [facility];  Service: Endoscopy;  Laterality: N/A;    Current Outpatient Medications  Medication Sig Dispense Refill  . albuterol (PROVENTIL HFA;VENTOLIN HFA) 108 (90 BASE) MCG/ACT inhaler Inhale 2 puffs into the lungs every 6 (six) hours as needed for wheezing or shortness of breath. 1 Inhaler 2  . ALPRAZolam (XANAX) 1 MG tablet Take 1 mg by mouth 4 (four) times daily.   4  . amitriptyline (ELAVIL) 100 MG tablet Take 100 mg by mouth at bedtime.    Marland Kitchen atorvastatin (LIPITOR) 40 MG tablet Take 40 mg by mouth every evening.   10  . canagliflozin (INVOKANA) 300 MG TABS tablet 300 mg daily before breakfast.     . docusate sodium (COLACE) 100 MG capsule Take 100 mg by mouth daily as needed for mild constipation.    . DULoxetine (CYMBALTA) 60 MG capsule Take 60 mg by mouth daily.   10  . furosemide (LASIX) 20 MG tablet Take 20 mg by mouth daily as needed for fluid.     Marland Kitchen gabapentin (NEURONTIN) 300 MG capsule Take 900mg  in the morning and at noon, then take 1200mg  at bedtime 300 capsule 11  . glimepiride (AMARYL) 2 MG tablet Take 2 mg by mouth daily with breakfast.    . glycopyrrolate (ROBINUL) 1 MG tablet Take 1 mg by mouth 2 (two) times daily.   2  . lidocaine (XYLOCAINE) 5 % ointment Apply 1 application topically 2 (two) times daily as needed. 100 g 5  . lisinopril (PRINIVIL,ZESTRIL) 20 MG tablet Take 20 mg by mouth daily.   4  . omeprazole (PRILOSEC) 20 MG capsule 1 PO 30 mins prior to breakfast and supper (Patient taking differently: Take 20 mg by mouth 2 (two) times daily before a meal. 1 PO 30 mins prior to breakfast and supper) 60 capsule 11  . oxyCODONE HCl 30 MG TABA Take 30 mg by mouth every 4 (four) hours as needed (for pain).   0  . primidone (MYSOLINE) 50 MG tablet Take 1 tablet (50 mg total) by mouth 2 (two) times daily.  60 tablet 11  . VOLTAREN 1 % GEL Apply 2 g topically 4 (four) times daily.     Marland Kitchen zolpidem (AMBIEN) 10 MG tablet Take 10 mg by mouth at bedtime.   3   No current facility-administered medications for this visit.     Allergies as of 06/18/2018  . (No Known Allergies)    Family History  Problem Relation Age of Onset  . Heart attack Mother        Living, 38  . Cancer Father        Deceased  . Healthy Brother   . Healthy Sister   . Colon cancer Neg Hx   . Colon polyps Neg Hx     Social History   Socioeconomic History  . Marital status: Married    Spouse name: Not on file  . Number of children: Not on file  . Years of education: Not on file  . Highest education level: Not on file  Occupational History  . 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  Social Needs  . Financial resource strain: Not on file  . Food insecurity:    Worry: Not on file    Inability: Not on file  . Transportation needs:    Medical: Not on file    Non-medical: Not on file  Tobacco Use  . Smoking status: Former Smoker    Packs/day: 2.50    Years: 25.00    Pack years: 62.50    Types: Cigarettes    Last attempt to quit: 08/05/1984    Years since quitting: 33.8  . Smokeless tobacco: Never Used  Substance and Sexual Activity  . Alcohol use: Yes    Alcohol/week: 50.0 standard drinks    Types: 50 Standard drinks or equivalent per week    Comment: As of 06/18/2018: Drinks 6 beers, liquor 4-6oz (both daily) x 35 years  . Drug use: Yes    Types: Marijuana    Comment: occasionally   . Sexual activity: Yes  Lifestyle  . Physical activity:    Days per week: Not on file    Minutes per session: Not on file  . Stress: Not on file  Relationships  . Social connections:    Talks on phone: Not on file    Gets together: Not on file    Attends religious service: Not on file    Active member of club or organization: Not on file    Attends meetings of clubs or organizations:  Not on file    Relationship status: Not on file  Other Topics Concern  . 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 Highest level of education:  8th grade   He lives with wife.  No children.    Review of Systems: Complete ROS negative except as per HPI.   Physical Exam: BP (!) 145/91   Pulse 99   Temp (!) 97.2 F (36.2 C) (Oral)   Ht 6\' 2"  (1.88 m)   Wt 282 lb 6.4 oz (128.1 kg)   BMI 36.26 kg/m  General:   Alert and oriented. Pleasant and cooperative. Well-nourished and well-developed.  Head:  Normocephalic and atraumatic. Eyes:  Without icterus, sclera clear and conjunctiva pink.  Ears:  Normal auditory acuity. Cardiovascular:  S1, S2 present without murmurs appreciated. Extremities without clubbing or edema. Respiratory:  Clear to auscultation bilaterally. No wheezes, rales, or rhonchi. No distress.  Gastrointestinal:  +BS, soft, non-tender and non-distended. No HSM noted. No guarding or rebound. No masses appreciated.  Rectal:  Deferred  Musculoskalatal:  Symmetrical without gross deformities. Skin:  Intact without significant lesions or rashes. Neurologic:  Alert and oriented x4;  grossly normal neurologically. Psych:  Alert and cooperative. Normal mood and affect. Heme/Lymph/Immune: No excessive bruising noted.    06/18/2018 12:06 PM   Disclaimer: This note was dictated with voice recognition software. Similar sounding words can inadvertently be transcribed and may not be corrected upon review.

## 2018-06-18 NOTE — Assessment & Plan Note (Signed)
Hepatic steatosis noted on ultrasound as well as mild splenomegaly.  He does abuse alcohol drinking about 6 beers and a couple shots of liquor every day for the past 35 years.  I discussed hepatic steatosis and its progress to sclerosis, cirrhosis, end-stage liver disease.  He says "no we do not want that."  I recommended again that he cut back on alcohol.  I have discussed we are available for resources if he needs help doing this.  I recommend he discuss with primary care as well if you would prefer.  Follow-up in 6 months.

## 2018-06-27 ENCOUNTER — Encounter: Payer: Self-pay | Admitting: Podiatry

## 2018-06-27 ENCOUNTER — Ambulatory Visit: Payer: Medicaid Other | Admitting: Podiatry

## 2018-06-27 VITALS — Temp 98.0°F

## 2018-06-27 DIAGNOSIS — L97421 Non-pressure chronic ulcer of left heel and midfoot limited to breakdown of skin: Secondary | ICD-10-CM

## 2018-06-27 DIAGNOSIS — E11621 Type 2 diabetes mellitus with foot ulcer: Secondary | ICD-10-CM

## 2018-06-27 MED ORDER — SILVER SULFADIAZINE 1 % EX CREA: TOPICAL_CREAM | CUTANEOUS | 0 refills | Status: DC

## 2018-06-27 NOTE — Progress Notes (Signed)
Subjective:  Patient ID: Eugene Diaz, male    DOB: November 29, 1965,  MRN: [id number]  Chief Complaint  Patient presents with  . Foot Ulcer    i think it is better on the left heel     52 y.o. male presents with the above complaint. States the ulcer is doing better. Denies any other new changes this visit.  Review of Systems: Negative except as noted in the HPI. Denies N/V/F/Ch.  Past Medical History:  Diagnosis Date  . Anxiety   . Arthritis   . Asthma   . Depression   . Diabetes mellitus without complication ([place]) 03/3663  . Hypercholesterolemia   . Hypertension   . Neuropathy   . Peripheral vascular disease ([place])     Current Outpatient Medications:  .  albuterol (PROVENTIL HFA;VENTOLIN HFA) 108 (90 BASE) MCG/ACT inhaler, Inhale 2 puffs into the lungs every 6 (six) hours as needed for wheezing or shortness of breath., Disp: 1 Inhaler, Rfl: 2 .  ALPRAZolam (XANAX) 1 MG tablet, Take 1 mg by mouth 4 (four) times daily. , Disp: , Rfl: 4 .  amitriptyline (ELAVIL) 100 MG tablet, Take 100 mg by mouth at bedtime., Disp: , Rfl:  .  atorvastatin (LIPITOR) 40 MG tablet, Take 40 mg by mouth every evening. , Disp: , Rfl: 10 .  canagliflozin (INVOKANA) 300 MG TABS tablet, 300 mg daily before breakfast. , Disp: , Rfl:  .  docusate sodium (COLACE) 100 MG capsule, Take 100 mg by mouth daily as needed for mild constipation., Disp: , Rfl:  .  DULoxetine (CYMBALTA) 60 MG capsule, Take 60 mg by mouth daily. , Disp: , Rfl: 10 .  furosemide (LASIX) 20 MG tablet, Take 20 mg by mouth daily as needed for fluid. , Disp: , Rfl:  .  gabapentin (NEURONTIN) 300 MG capsule, Take 900mg  in the morning and at noon, then take 1200mg  at bedtime, Disp: 300 capsule, Rfl: 11 .  glimepiride (AMARYL) 2 MG tablet, Take 2 mg by mouth daily with breakfast., Disp: , Rfl:  .  glycopyrrolate (ROBINUL) 1 MG tablet, Take 1 mg by mouth 2 (two) times daily. , Disp: , Rfl: 2 .  lidocaine (XYLOCAINE) 5 % ointment, Apply 1  application topically 2 (two) times daily as needed., Disp: 100 g, Rfl: 5 .  lisinopril (PRINIVIL,ZESTRIL) 20 MG tablet, Take 20 mg by mouth daily. , Disp: , Rfl: 4 .  omeprazole (PRILOSEC) 20 MG capsule, 1 PO 30 mins prior to breakfast and supper (Patient taking differently: Take 20 mg by mouth 2 (two) times daily before a meal. 1 PO 30 mins prior to breakfast and supper), Disp: 60 capsule, Rfl: 11 .  oxyCODONE HCl 30 MG TABA, Take 30 mg by mouth every 4 (four) hours as needed (for pain). , Disp: , Rfl: 0 .  primidone (MYSOLINE) 50 MG tablet, Take 1 tablet (50 mg total) by mouth 2 (two) times daily., Disp: 60 tablet, Rfl: 11 .  silver sulfADIAZINE (SILVADENE) 1 % cream, Apply pea-sized amount to wound daily., Disp: 50 g, Rfl: 0 .  VOLTAREN 1 % GEL, Apply 2 g topically 4 (four) times daily. , Disp: , Rfl:  .  zolpidem (AMBIEN) 10 MG tablet, Take 10 mg by mouth at bedtime. , Disp: , Rfl: 3  Social History   Tobacco Use  Smoking Status Former Smoker  . Packs/day: 2.50  . Years: 25.00  . Pack years: 62.50  . Types: Cigarettes  . Last attempt to quit: 08/05/1984  .  Years since quitting: 33.9  Smokeless Tobacco Never Used    No Known Allergies Objective:   Vitals:   06/27/18 1314  Temp: 98 F (36.7 C)   There is no height or weight on file to calculate BMI. Constitutional Well developed. Well nourished.  Vascular Dorsalis pedis pulses palpable bilaterally. Posterior tibial pulses palpable bilaterally. Capillary refill normal to all digits.  No cyanosis or clubbing noted. Pedal hair growth normal.  Neurologic Normal speech. Oriented to person, place, and time. Epicritic sensation to light touch grossly present bilaterally.  Dermatologic Nails well groomed and normal in appearance. No skin lesions. L heel ulcer with fibrogranular base. No warmth, erythema, signs of infection. No active drainage.  8/16: 4.5.x5 post-debridement. 8/28: 1x 2.5 post-debridement  Orthopedic: Normal  joint ROM without pain or crepitus bilaterally. No visible deformities. No bony tenderness.   Radiographs: X-rays taken reviewed.  No underlying osseous erosion. Assessment:   1. 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  L Heel Ulcer -Debridement performed as below -X-rays taken as above -Dressed with medihoney and DSD -Educated on self-care for the ulceration  Procedure: Excisional Debridement of Wound Rationale: Removal of non-viable soft tissue from the wound to promote healing.  Anesthesia: none Pre-Debridement Wound Measurements: 1 cm x 2 cm x 0.1 cm  Post-Debridement Wound Measurements: 1 cm x 2.5 cm x 0.1 cm  Type of Debridement: Sharp Excisional Tissue Removed: Non-viable soft tissue Depth of Debridement: subcutaneous tissue. Technique: Sharp excisional debridement to bleeding, viable wound base.  Dressing: Dry, sterile, compression dressing. Disposition: Patient tolerated procedure well. Patient to return in 1 week for follow-up.  Return in about 2 weeks (around 07/11/2018) for wound care, left.

## 2018-06-29 ENCOUNTER — Ambulatory Visit: Payer: Medicaid Other | Admitting: Podiatry

## 2018-07-06 ENCOUNTER — Encounter: Payer: Self-pay | Admitting: Neurology

## 2018-07-12 ENCOUNTER — Encounter: Payer: Self-pay | Admitting: Podiatry

## 2018-07-12 ENCOUNTER — Ambulatory Visit: Payer: Medicaid Other | Admitting: Podiatry

## 2018-07-12 VITALS — BP 132/87 | HR 87 | Temp 97.4°F | Resp 15

## 2018-07-12 DIAGNOSIS — E11610 Type 2 diabetes mellitus with diabetic neuropathic arthropathy: Secondary | ICD-10-CM

## 2018-07-12 DIAGNOSIS — L97421 Non-pressure chronic ulcer of left heel and midfoot limited to breakdown of skin: Secondary | ICD-10-CM | POA: Diagnosis not present

## 2018-07-12 DIAGNOSIS — E11621 Type 2 diabetes mellitus with foot ulcer: Secondary | ICD-10-CM

## 2018-07-12 NOTE — Progress Notes (Signed)
Subjective:  Patient ID: Eugene Diaz, male    DOB: 1965-11-08,  MRN: [id number]  Chief Complaint  Patient presents with  . Foot Ulcer    F?U L heel ulcer Pt. stated," a whole l lot better." Tx; medihoney and dressing change -pt denies N/V/?FCh -w/ light redness    52 y.o. male presents with the above complaint. Using medihoney states the wound is doing well. Denies other pedal issues  Review of Systems: Negative except as noted in the HPI. Denies N/V/F/Ch.  Past Medical History:  Diagnosis Date  . Anxiety   . Arthritis   . Asthma   . Depression   . Diabetes mellitus without complication ([place]) 02/6212  . Hypercholesterolemia   . Hypertension   . Neuropathy   . Peripheral vascular disease ([place])     Current Outpatient Medications:  .  albuterol (PROVENTIL HFA;VENTOLIN HFA) 108 (90 BASE) MCG/ACT inhaler, Inhale 2 puffs into the lungs every 6 (six) hours as needed for wheezing or shortness of breath., Disp: 1 Inhaler, Rfl: 2 .  ALPRAZolam (XANAX) 1 MG tablet, Take 1 mg by mouth 4 (four) times daily. , Disp: , Rfl: 4 .  amitriptyline (ELAVIL) 100 MG tablet, Take 100 mg by mouth at bedtime., Disp: , Rfl:  .  atorvastatin (LIPITOR) 40 MG tablet, Take 40 mg by mouth every evening. , Disp: , Rfl: 10 .  canagliflozin (INVOKANA) 300 MG TABS tablet, 300 mg daily before breakfast. , Disp: , Rfl:  .  docusate sodium (COLACE) 100 MG capsule, Take 100 mg by mouth daily as needed for mild constipation., Disp: , Rfl:  .  DULoxetine (CYMBALTA) 60 MG capsule, Take 60 mg by mouth daily. , Disp: , Rfl: 10 .  furosemide (LASIX) 20 MG tablet, Take 20 mg by mouth daily as needed for fluid. , Disp: , Rfl:  .  gabapentin (NEURONTIN) 300 MG capsule, Take 900mg  in the morning and at noon, then take 1200mg  at bedtime, Disp: 300 capsule, Rfl: 11 .  glimepiride (AMARYL) 2 MG tablet, Take 2 mg by mouth daily with breakfast., Disp: , Rfl:  .  glycopyrrolate (ROBINUL) 1 MG tablet, Take 1 mg by mouth 2 (two)  times daily. , Disp: , Rfl: 2 .  lidocaine (XYLOCAINE) 5 % ointment, Apply 1 application topically 2 (two) times daily as needed., Disp: 100 g, Rfl: 5 .  lisinopril (PRINIVIL,ZESTRIL) 20 MG tablet, Take 20 mg by mouth daily. , Disp: , Rfl: 4 .  omeprazole (PRILOSEC) 20 MG capsule, 1 PO 30 mins prior to breakfast and supper (Patient taking differently: Take 20 mg by mouth 2 (two) times daily before a meal. 1 PO 30 mins prior to breakfast and supper), Disp: 60 capsule, Rfl: 11 .  oxyCODONE HCl 30 MG TABA, Take 30 mg by mouth every 4 (four) hours as needed (for pain). , Disp: , Rfl: 0 .  primidone (MYSOLINE) 50 MG tablet, Take 1 tablet (50 mg total) by mouth 2 (two) times daily., Disp: 60 tablet, Rfl: 11 .  silver sulfADIAZINE (SILVADENE) 1 % cream, Apply pea-sized amount to wound daily., Disp: 50 g, Rfl: 0 .  VOLTAREN 1 % GEL, Apply 2 g topically 4 (four) times daily. , Disp: , Rfl:  .  zolpidem (AMBIEN) 10 MG tablet, Take 10 mg by mouth at bedtime. , Disp: , Rfl: 3  Social History   Tobacco Use  Smoking Status Former Smoker  . Packs/day: 2.50  . Years: 25.00  . Pack years: 62.50  .  Types: Cigarettes  . Last attempt to quit: 08/05/1984  . Years since quitting: 33.9  Smokeless Tobacco Never Used    No Known Allergies Objective:   Vitals:   07/12/18 1417  BP: 132/87  Pulse: 87  Resp: 15  Temp: (!) 97.4 F (36.3 C)   There is no height or weight on file to calculate BMI. Constitutional Well developed. Well nourished.  Vascular Dorsalis pedis pulses palpable bilaterally. Posterior tibial pulses palpable bilaterally. Capillary refill normal to all digits.  No cyanosis or clubbing noted. Pedal hair growth normal.  Neurologic Normal speech. Oriented to person, place, and time. Epicritic sensation to light touch grossly present bilaterally.  Dermatologic Nails well groomed and normal in appearance. No skin lesions. L heel ulcer epithelializing with only small open area. No warmth.  +ss drainage.   8/16: 4.5.x5 post-debridement. 8/28: 1x 2.5 post-debridement 9/12: 0.5x1.5 post-debridement.  Orthopedic: Normal joint ROM without pain or crepitus bilaterally. No visible deformities. No bony tenderness.   Radiographs: X-rays taken reviewed.  No underlying osseous erosion. Assessment:   1. 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  L Heel Ulcer -Debridement performed as below -Dressed with medihoney and DSD -Educated on self-care for the ulceration  Procedure: Excisional Debridement of Wound Rationale: Removal of non-viable soft tissue from the wound to promote healing.  Anesthesia: none Pre-Debridement Wound Measurements: 0.5 cm x 1 cm x 0.2 cm  Post-Debridement Wound Measurements: 0.5 cm x 1.5 cm x 0.2 cm  Type of Debridement: Sharp Excisional Tissue Removed: Non-viable soft tissue Depth of Debridement: subcutaneous tissue. Technique: Sharp excisional debridement to bleeding, viable wound base.  Dressing: Dry, sterile, compression dressing. Disposition: Patient tolerated procedure well. Patient to return in 1 week for follow-up.  Return in about 2 weeks (around 07/26/2018) for Wound Care, Left.

## 2018-07-24 ENCOUNTER — Telehealth: Payer: Self-pay | Admitting: Podiatry

## 2018-07-24 NOTE — Telephone Encounter (Signed)
Patient wanted to speak with you about his wound, he is scheduled to come in to see [name] on Thursday 9/26.

## 2018-07-24 NOTE — Telephone Encounter (Signed)
Pt states his heel is more red and the hole bigger from where he pulled the shop towel off, that he uses for dressing. I told pt we should get him in to be seen earlier than 07/26/2018 and transferred to schedulers.

## 2018-07-24 NOTE — Telephone Encounter (Signed)
Called pt and got him scheduled with [name] tomorrow at 3:45 pm. Dr. Randie Heinz me to schedule with him.

## 2018-07-25 ENCOUNTER — Encounter: Payer: Self-pay | Admitting: Podiatry

## 2018-07-25 ENCOUNTER — Ambulatory Visit (INDEPENDENT_AMBULATORY_CARE_PROVIDER_SITE_OTHER): Payer: Medicaid Other

## 2018-07-25 ENCOUNTER — Ambulatory Visit: Payer: Medicaid Other | Admitting: Podiatry

## 2018-07-25 VITALS — Temp 98.1°F

## 2018-07-25 DIAGNOSIS — E11621 Type 2 diabetes mellitus with foot ulcer: Secondary | ICD-10-CM

## 2018-07-25 DIAGNOSIS — L97421 Non-pressure chronic ulcer of left heel and midfoot limited to breakdown of skin: Secondary | ICD-10-CM

## 2018-07-25 DIAGNOSIS — L02619 Cutaneous abscess of unspecified foot: Secondary | ICD-10-CM

## 2018-07-25 DIAGNOSIS — L03119 Cellulitis of unspecified part of limb: Principal | ICD-10-CM

## 2018-07-25 MED ORDER — DOXYCYCLINE HYCLATE 100 MG PO TABS: 100.0000 mg | ORAL_TABLET | Freq: Two times a day (BID) | ORAL | 0 refills | Status: DC

## 2018-07-25 NOTE — Progress Notes (Signed)
Subjective:  Patient ID: Eugene Diaz, male    DOB: 1966-09-16,  MRN: [id number]  Chief Complaint  Patient presents with  . Foot Ulcer    LEFT HEEL IS LOOKING BAD     52 y.o. male presents with the above complaint. States the wound is looking worse. Has been picking at it and thinks this made it worse. Denies N/V/F/Ch. Denies other pedal issues.  Review of Systems: Negative except as noted in the HPI. Denies N/V/F/Ch.  Past Medical History:  Diagnosis Date  . Anxiety   . Arthritis   . Asthma   . Depression   . Diabetes mellitus without complication ([place]) 0/7622  . Hypercholesterolemia   . Hypertension   . Neuropathy   . Peripheral vascular disease ([place])     Current Outpatient Medications:  .  albuterol (PROVENTIL HFA;VENTOLIN HFA) 108 (90 BASE) MCG/ACT inhaler, Inhale 2 puffs into the lungs every 6 (six) hours as needed for wheezing or shortness of breath., Disp: 1 Inhaler, Rfl: 2 .  ALPRAZolam (XANAX) 1 MG tablet, Take 1 mg by mouth 4 (four) times daily. , Disp: , Rfl: 4 .  amitriptyline (ELAVIL) 100 MG tablet, Take 100 mg by mouth at bedtime., Disp: , Rfl:  .  atorvastatin (LIPITOR) 40 MG tablet, Take 40 mg by mouth every evening. , Disp: , Rfl: 10 .  canagliflozin (INVOKANA) 300 MG TABS tablet, 300 mg daily before breakfast. , Disp: , Rfl:  .  docusate sodium (COLACE) 100 MG capsule, Take 100 mg by mouth daily as needed for mild constipation., Disp: , Rfl:  .  doxycycline (VIBRA-TABS) 100 MG tablet, Take 1 tablet (100 mg total) by mouth 2 (two) times daily., Disp: 20 tablet, Rfl: 0 .  DULoxetine (CYMBALTA) 60 MG capsule, Take 60 mg by mouth daily. , Disp: , Rfl: 10 .  furosemide (LASIX) 20 MG tablet, Take 20 mg by mouth daily as needed for fluid. , Disp: , Rfl:  .  gabapentin (NEURONTIN) 300 MG capsule, Take 900mg  in the morning and at noon, then take 1200mg  at bedtime, Disp: 300 capsule, Rfl: 11 .  glimepiride (AMARYL) 2 MG tablet, Take 2 mg by mouth daily with  breakfast., Disp: , Rfl:  .  glycopyrrolate (ROBINUL) 1 MG tablet, Take 1 mg by mouth 2 (two) times daily. , Disp: , Rfl: 2 .  lidocaine (XYLOCAINE) 5 % ointment, Apply 1 application topically 2 (two) times daily as needed., Disp: 100 g, Rfl: 5 .  lisinopril (PRINIVIL,ZESTRIL) 20 MG tablet, Take 20 mg by mouth daily. , Disp: , Rfl: 4 .  omeprazole (PRILOSEC) 20 MG capsule, 1 PO 30 mins prior to breakfast and supper (Patient taking differently: Take 20 mg by mouth 2 (two) times daily before a meal. 1 PO 30 mins prior to breakfast and supper), Disp: 60 capsule, Rfl: 11 .  oxyCODONE HCl 30 MG TABA, Take 30 mg by mouth every 4 (four) hours as needed (for pain). , Disp: , Rfl: 0 .  primidone (MYSOLINE) 50 MG tablet, Take 1 tablet (50 mg total) by mouth 2 (two) times daily., Disp: 60 tablet, Rfl: 11 .  silver sulfADIAZINE (SILVADENE) 1 % cream, Apply pea-sized amount to wound daily., Disp: 50 g, Rfl: 0 .  VOLTAREN 1 % GEL, Apply 2 g topically 4 (four) times daily. , Disp: , Rfl:  .  zolpidem (AMBIEN) 10 MG tablet, Take 10 mg by mouth at bedtime. , Disp: , Rfl: 3  Social History   Tobacco  Use  Smoking Status Former Smoker  . Packs/day: 2.50  . Years: 25.00  . Pack years: 62.50  . Types: Cigarettes  . Last attempt to quit: 08/05/1984  . Years since quitting: 33.9  Smokeless Tobacco Never Used    No Known Allergies Objective:   Vitals:   07/25/18 1622  Temp: 98.1 F (36.7 C)   There is no height or weight on file to calculate BMI. Constitutional Well developed. Well nourished.  Vascular Dorsalis pedis pulses palpable bilaterally. Posterior tibial pulses palpable bilaterally. Capillary refill normal to all digits.  No cyanosis or clubbing noted. Pedal hair growth normal.  Neurologic Normal speech. Oriented to person, place, and time. Epicritic sensation to light touch grossly present bilaterally.  Dermatologic Nails well groomed and normal in appearance. No skin lesions. L heel  ulcer with erythema, warmth, serosanguinous drainage. Fluctuant pocket noted. Fibrotic wound base. 8/16: 4.5.x5 post-debridement. 8/28: 1x 2.5 post-debridement 9/12: 0.5x1.5 post-debridement.  9/25: 6x2.5 post-debridement.   Orthopedic: Normal joint ROM without pain or crepitus bilaterally. No visible deformities. No bony tenderness.   Radiographs: X-rays taken reviewed.  No underlying osseous erosion. Assessment:   1. Cellulitis and abscess of foot, except toes   2. 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  L Heel Ulcer -Incision and drainage performed as below. -Dressed with betadineand DSD -Patient to dress daily as such. -Will f/u culture -Rx Doxycycline for cellulitis foot. -The wound will likely need repeat debridement at next visit.  Procedure: Incision and drainage of abscess Anesthesia: none Instrumentation: 312 blade Technique: Following skin prep with betadine, the abscess was incised with a 312 blade. Serosanguinous fluid was expressed. The abscess pocket was swabbed and sent for culture. The area was thoroughly debrided with a 312 blade and tissue nipper of all necrotic and fibrotic tissue. Dressing: Dry, sterile, compression dressing. Disposition: Patient tolerated procedure well. Patient to return in 1 week for follow-up.   Return in about 1 week (around 08/01/2018) for Wound Care, Left.

## 2018-07-26 ENCOUNTER — Ambulatory Visit: Payer: Medicaid Other | Admitting: Podiatry

## 2018-07-28 LAB — WOUND CULTURE
MICRO NUMBER:: 91152885
SPECIMEN QUALITY:: ADEQUATE

## 2018-08-02 ENCOUNTER — Ambulatory Visit (INDEPENDENT_AMBULATORY_CARE_PROVIDER_SITE_OTHER): Payer: Medicaid Other | Admitting: Podiatry

## 2018-08-02 DIAGNOSIS — L97421 Non-pressure chronic ulcer of left heel and midfoot limited to breakdown of skin: Secondary | ICD-10-CM

## 2018-08-02 DIAGNOSIS — E11621 Type 2 diabetes mellitus with foot ulcer: Secondary | ICD-10-CM

## 2018-08-02 DIAGNOSIS — G621 Alcoholic polyneuropathy: Secondary | ICD-10-CM

## 2018-08-02 DIAGNOSIS — E1142 Type 2 diabetes mellitus with diabetic polyneuropathy: Secondary | ICD-10-CM

## 2018-08-02 DIAGNOSIS — L02619 Cutaneous abscess of unspecified foot: Secondary | ICD-10-CM

## 2018-08-02 DIAGNOSIS — L03119 Cellulitis of unspecified part of limb: Secondary | ICD-10-CM

## 2018-08-02 MED ORDER — DOXYCYCLINE HYCLATE 100 MG PO TABS: 100.0000 mg | ORAL_TABLET | Freq: Two times a day (BID) | ORAL | 0 refills | Status: DC

## 2018-08-02 NOTE — Progress Notes (Signed)
Subjective:  Patient ID: Eugene Diaz, male    DOB: 07/22/1966,  MRN: [id number]  Chief Complaint  Patient presents with  . Wound Check    Left heel wound check. Pt believes healing well and doing much better since last appointment. No fever/chills. Some redness and swelling.    52 y.o. male presents with the above complaint.  Thinks the wound is looking better.  Unsure if he is taking antibiotics states that he takes a lot of medications but not sure he picked it up from pharmacy.  States that he does not know if he has the money to pay for medication.  Review of Systems: Negative except as noted in the HPI. Denies N/V/F/Ch.  Past Medical History:  Diagnosis Date  . Anxiety   . Arthritis   . Asthma   . Depression   . Diabetes mellitus without complication ([place]) 05/1695  . Hypercholesterolemia   . Hypertension   . Neuropathy   . Peripheral vascular disease ([place])     Current Outpatient Medications:  .  albuterol (PROVENTIL HFA;VENTOLIN HFA) 108 (90 BASE) MCG/ACT inhaler, Inhale 2 puffs into the lungs every 6 (six) hours as needed for wheezing or shortness of breath., Disp: 1 Inhaler, Rfl: 2 .  ALPRAZolam (XANAX) 1 MG tablet, Take 1 mg by mouth 4 (four) times daily. , Disp: , Rfl: 4 .  amitriptyline (ELAVIL) 100 MG tablet, Take 100 mg by mouth at bedtime., Disp: , Rfl:  .  atorvastatin (LIPITOR) 40 MG tablet, Take 40 mg by mouth every evening. , Disp: , Rfl: 10 .  canagliflozin (INVOKANA) 300 MG TABS tablet, 300 mg daily before breakfast. , Disp: , Rfl:  .  docusate sodium (COLACE) 100 MG capsule, Take 100 mg by mouth daily as needed for mild constipation., Disp: , Rfl:  .  doxycycline (VIBRA-TABS) 100 MG tablet, Take 1 tablet (100 mg total) by mouth 2 (two) times daily., Disp: 20 tablet, Rfl: 0 .  DULoxetine (CYMBALTA) 60 MG capsule, Take 60 mg by mouth daily. , Disp: , Rfl: 10 .  furosemide (LASIX) 20 MG tablet, Take 20 mg by mouth daily as needed for fluid. , Disp: , Rfl:  .   gabapentin (NEURONTIN) 300 MG capsule, Take 900mg  in the morning and at noon, then take 1200mg  at bedtime, Disp: 300 capsule, Rfl: 11 .  glimepiride (AMARYL) 2 MG tablet, Take 2 mg by mouth daily with breakfast., Disp: , Rfl:  .  glycopyrrolate (ROBINUL) 1 MG tablet, Take 1 mg by mouth 2 (two) times daily. , Disp: , Rfl: 2 .  lidocaine (XYLOCAINE) 5 % ointment, Apply 1 application topically 2 (two) times daily as needed., Disp: 100 g, Rfl: 5 .  lisinopril (PRINIVIL,ZESTRIL) 20 MG tablet, Take 20 mg by mouth daily. , Disp: , Rfl: 4 .  omeprazole (PRILOSEC) 20 MG capsule, 1 PO 30 mins prior to breakfast and supper (Patient taking differently: Take 20 mg by mouth 2 (two) times daily before a meal. 1 PO 30 mins prior to breakfast and supper), Disp: 60 capsule, Rfl: 11 .  oxyCODONE HCl 30 MG TABA, Take 30 mg by mouth every 4 (four) hours as needed (for pain). , Disp: , Rfl: 0 .  primidone (MYSOLINE) 50 MG tablet, Take 1 tablet (50 mg total) by mouth 2 (two) times daily., Disp: 60 tablet, Rfl: 11 .  silver sulfADIAZINE (SILVADENE) 1 % cream, Apply pea-sized amount to wound daily., Disp: 50 g, Rfl: 0 .  VOLTAREN 1 %  GEL, Apply 2 g topically 4 (four) times daily. , Disp: , Rfl:  .  zolpidem (AMBIEN) 10 MG tablet, Take 10 mg by mouth at bedtime. , Disp: , Rfl: 3  Social History   Tobacco Use  Smoking Status Former Smoker  . Packs/day: 2.50  . Years: 25.00  . Pack years: 62.50  . Types: Cigarettes  . Last attempt to quit: 08/05/1984  . Years since quitting: 34.0  Smokeless Tobacco Never Used    No Known Allergies Objective:   There were no vitals filed for this visit. There is no height or weight on file to calculate BMI. Constitutional Well developed. Well nourished.  Vascular Dorsalis pedis pulses palpable bilaterally. Posterior tibial pulses palpable bilaterally. Capillary refill normal to all digits.  No cyanosis or clubbing noted. Pedal hair growth normal.  Neurologic Normal  speech. Oriented to person, place, and time. Epicritic sensation to light touch grossly present bilaterally.  Dermatologic Nails well groomed and normal in appearance. No skin lesions. L heel ulcer with slight erythema, warmth, serosanguinous drainage. Fluctuant pocket noted. Fibrotic wound base. 8/16: 4.5.x5 post-debridement. 8/28: 1x 2.5 post-debridement 9/12: 0.5x1.5 post-debridement. 9/25: 6x2.5 post-debridement. 10/3: 4x2.5 post-debridement.     Orthopedic: Normal joint ROM without pain or crepitus bilaterally. No visible deformities. No bony tenderness.   Radiographs:  9/25: X-rays taken reviewed.  No underlying osseous erosion.  Results for orders placed or performed in visit on 07/25/18  WOUND CULTURE     Status: Abnormal   Collection Time: 07/25/18  4:41 PM  Result Value Ref Range Status   MICRO NUMBER: [id number]  Final   SPECIMEN QUALITY: ADEQUATE  Final   SOURCE: LEFT HEEL  Final   STATUS: FINAL  Final   GRAM STAIN:   Final    No white blood cells seen No epithelial cells seen No organisms seen   ISOLATE 1: Streptococcus agalactiae (A)  Final    Comment: Light growth of Group B Streptococcus isolated Beta-hemolytic Streptococci are predictably susceptible to penicillin and other beta-lactams. Susceptibility testing not routinely performed.   Assessment:   1. 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 Cellulitis and abscess of foot, except toes   3. Alcoholic peripheral neuropathy ([place])   4. DM type 2 with diabetic peripheral neuropathy ([place])    Plan:  Patient was evaluated and treated and all questions answered.  L Heel Ulcer -Debridement performed as below -Dressed with medihoney and DSD -Patient to dress daily as such. -Culture as above. Should be sensitive to doxycycline. -Reorder doxycycline for cellulitis foot.  Discussed the importance of taking the medication as it is slightly  red.  Procedure: Excisional Debridement of Wound Rationale: Removal of non-viable soft tissue from the wound to promote healing.  Anesthesia: none Pre-Debridement Wound Measurements: 4 cm x 2 cm x 0.2 cm  Post-Debridement Wound Measurements: 4 cm x 2.5 cm x 0.2 cm  Type of Debridement: Sharp Excisional Tissue Removed: Non-viable soft tissue Depth of Debridement: subcutaneous tissue. Technique: Sharp excisional debridement to bleeding, viable wound base.  Dressing: Dry, sterile, compression dressing. Disposition: Patient tolerated procedure well. Patient to return in 1 week for follow-up.   Return in about 11 days (around 08/13/2018).

## 2018-08-13 ENCOUNTER — Encounter: Payer: Self-pay | Admitting: Podiatry

## 2018-08-13 ENCOUNTER — Ambulatory Visit (INDEPENDENT_AMBULATORY_CARE_PROVIDER_SITE_OTHER): Payer: Medicaid Other | Admitting: Podiatry

## 2018-08-13 VITALS — BP 152/98 | HR 89 | Temp 98.7°F | Resp 16

## 2018-08-13 DIAGNOSIS — L97421 Non-pressure chronic ulcer of left heel and midfoot limited to breakdown of skin: Secondary | ICD-10-CM

## 2018-08-13 DIAGNOSIS — E11621 Type 2 diabetes mellitus with foot ulcer: Secondary | ICD-10-CM

## 2018-08-13 NOTE — Progress Notes (Signed)
Subjective:  Patient ID: Eugene Diaz, male    DOB: [birth year],  MRN: [id number]  Chief Complaint  Patient presents with  . Wound Check    F/U L heel ulcer PT. stated," it's looking good with less drainage." Tx: doxycycline, medihoney and other cream foot foot -FBS: 296 A1C: 7 PCP: [facility] x 3 mo -Pt. denies N/v/f/CH    51 y.o. male presents with the above complaint.  History as above.  Review of Systems: Negative except as noted in the HPI. Denies N/V/F/Ch.  Past Medical History:  Diagnosis Date  . Anxiety   . Arthritis   . Asthma   . Depression   . Diabetes mellitus without complication ([place]) 07/6294  . Hypercholesterolemia   . Hypertension   . Neuropathy   . Peripheral vascular disease ([place])     Current Outpatient Medications:  .  albuterol (PROVENTIL HFA;VENTOLIN HFA) 108 (90 BASE) MCG/ACT inhaler, Inhale 2 puffs into the lungs every 6 (six) hours as needed for wheezing or shortness of breath., Disp: 1 Inhaler, Rfl: 2 .  ALPRAZolam (XANAX) 1 MG tablet, Take 1 mg by mouth 4 (four) times daily. , Disp: , Rfl: 4 .  amitriptyline (ELAVIL) 100 MG tablet, Take 100 mg by mouth at bedtime., Disp: , Rfl:  .  atorvastatin (LIPITOR) 40 MG tablet, Take 40 mg by mouth every evening. , Disp: , Rfl: 10 .  canagliflozin (INVOKANA) 300 MG TABS tablet, 300 mg daily before breakfast. , Disp: , Rfl:  .  docusate sodium (COLACE) 100 MG capsule, Take 100 mg by mouth daily as needed for mild constipation., Disp: , Rfl:  .  doxycycline (VIBRA-TABS) 100 MG tablet, Take 1 tablet (100 mg total) by mouth 2 (two) times daily., Disp: 20 tablet, Rfl: 0 .  DULoxetine (CYMBALTA) 60 MG capsule, Take 60 mg by mouth daily. , Disp: , Rfl: 10 .  furosemide (LASIX) 20 MG tablet, Take 20 mg by mouth daily as needed for fluid. , Disp: , Rfl:  .  gabapentin (NEURONTIN) 300 MG capsule, Take 900mg  in the morning and at noon, then take 1200mg  at bedtime, Disp: 300 capsule, Rfl: 11 .  glimepiride (AMARYL) 2 MG tablet,  Take 2 mg by mouth daily with breakfast., Disp: , Rfl:  .  glycopyrrolate (ROBINUL) 1 MG tablet, Take 1 mg by mouth 2 (two) times daily. , Disp: , Rfl: 2 .  lidocaine (XYLOCAINE) 5 % ointment, Apply 1 application topically 2 (two) times daily as needed., Disp: 100 g, Rfl: 5 .  lisinopril (PRINIVIL,ZESTRIL) 20 MG tablet, Take 20 mg by mouth daily. , Disp: , Rfl: 4 .  omeprazole (PRILOSEC) 20 MG capsule, 1 PO 30 mins prior to breakfast and supper (Patient taking differently: Take 20 mg by mouth 2 (two) times daily before a meal. 1 PO 30 mins prior to breakfast and supper), Disp: 60 capsule, Rfl: 11 .  oxyCODONE HCl 30 MG TABA, Take 30 mg by mouth every 4 (four) hours as needed (for pain). , Disp: , Rfl: 0 .  primidone (MYSOLINE) 50 MG tablet, Take 1 tablet (50 mg total) by mouth 2 (two) times daily., Disp: 60 tablet, Rfl: 11 .  silver sulfADIAZINE (SILVADENE) 1 % cream, Apply pea-sized amount to wound daily., Disp: 50 g, Rfl: 0 .  VOLTAREN 1 % GEL, Apply 2 g topically 4 (four) times daily. , Disp: , Rfl:  .  zolpidem (AMBIEN) 10 MG tablet, Take 10 mg by mouth at bedtime. , Disp: , Rfl:  3  Social History   Tobacco Use  Smoking Status Former Smoker  . Packs/day: 2.50  . Years: 25.00  . Pack years: 62.50  . Types: Cigarettes  . Last attempt to quit: 08/05/1984  . Years since quitting: 34.0  Smokeless Tobacco Never Used    No Known Allergies Objective:   Vitals:   08/13/18 1450  BP: (!) 152/98  Pulse: 89  Resp: 16  Temp: 98.7 F (37.1 C)   There is no height or weight on file to calculate BMI. Constitutional Well developed. Well nourished.  Vascular Dorsalis pedis pulses palpable bilaterally. Posterior tibial pulses palpable bilaterally. Capillary refill normal to all digits.  No cyanosis or clubbing noted. Pedal hair growth normal.  Neurologic Normal speech. Oriented to person, place, and time. Epicritic sensation to light touch grossly present bilaterally.  Dermatologic Nails  well groomed and normal in appearance. No skin lesions. L heel ulcer with slight erythema, warmth, serosanguinous drainage. Fluctuant pocket noted. Fibrotic wound base. 8/16: 4.5.x5 post-debridement. 8/28: 1x 2.5 post-debridement 9/12: 0.5x1.5 post-debridement. 9/25: 6x2.5 post-debridement. 10/3: 4x2.5 post-debridement. 10/14: 2x1.5 post-debridement.   Orthopedic: Normal joint ROM without pain or crepitus bilaterally. No visible deformities. No bony tenderness.   Radiographs:  9/25: X-rays taken reviewed.  No underlying osseous erosion.  Assessment:   1. 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  L Heel Ulcer -Improving. -Debrided as below. -Continue medihoney and dry sterile dressing  Procedure: Excisional Debridement of Wound Rationale: Removal of non-viable soft tissue from the wound to promote healing.  Anesthesia: none Pre-Debridement Wound Measurements: 2 cm x 1.5 cm x 0.2 cm  Post-Debridement Wound Measurements: 2 cm x 1.5 cm x 0.2 cm  Type of Debridement: Sharp Excisional Tissue Removed: Non-viable soft tissue Depth of Debridement: subcutaneous tissue. Technique: Sharp excisional debridement to bleeding, viable wound base.  Dressing: Dry, sterile, compression dressing. Disposition: Patient tolerated procedure well. Patient to return in 1 week for follow-up.    Return in about 2 weeks (around 08/27/2018) for Wound Care, Left.

## 2018-08-16 ENCOUNTER — Other Ambulatory Visit: Payer: Self-pay

## 2018-08-16 MED ORDER — SILVER SULFADIAZINE 1 % EX CREA: TOPICAL_CREAM | CUTANEOUS | 0 refills | Status: DC

## 2018-09-06 ENCOUNTER — Ambulatory Visit: Payer: Medicaid Other | Admitting: Podiatry

## 2018-09-06 DIAGNOSIS — L97421 Non-pressure chronic ulcer of left heel and midfoot limited to breakdown of skin: Secondary | ICD-10-CM

## 2018-09-06 DIAGNOSIS — E11621 Type 2 diabetes mellitus with foot ulcer: Secondary | ICD-10-CM

## 2018-09-06 MED ORDER — SILVER SULFADIAZINE 1 % EX CREA: TOPICAL_CREAM | CUTANEOUS | 0 refills | Status: DC

## 2018-09-06 MED ORDER — DOXYCYCLINE HYCLATE 100 MG PO TABS: 100.0000 mg | ORAL_TABLET | Freq: Two times a day (BID) | ORAL | 0 refills | Status: DC

## 2018-09-06 NOTE — Progress Notes (Signed)
Subjective:  Patient ID: Eugene Diaz, male    DOB: 03/29/1966,  MRN: [id number]  Chief Complaint  Patient presents with  . Diabetic Ulcer    left heel - looks better  . Peripheral Neuropathy    patient reports that he has had 3 falls recently    52 y.o. male presents with the above complaint.  History as above.  Review of Systems: Negative except as noted in the HPI. Denies N/V/F/Ch.  Past Medical History:  Diagnosis Date  . Anxiety   . Arthritis   . Asthma   . Depression   . Diabetes mellitus without complication ([place]) 12/4095  . Hypercholesterolemia   . Hypertension   . Neuropathy   . Peripheral vascular disease ([place])     Current Outpatient Medications:  .  albuterol (PROVENTIL HFA;VENTOLIN HFA) 108 (90 BASE) MCG/ACT inhaler, Inhale 2 puffs into the lungs every 6 (six) hours as needed for wheezing or shortness of breath., Disp: 1 Inhaler, Rfl: 2 .  ALPRAZolam (XANAX) 1 MG tablet, Take 1 mg by mouth 4 (four) times daily. , Disp: , Rfl: 4 .  amitriptyline (ELAVIL) 100 MG tablet, Take 100 mg by mouth at bedtime., Disp: , Rfl:  .  atorvastatin (LIPITOR) 40 MG tablet, Take 40 mg by mouth every evening. , Disp: , Rfl: 10 .  canagliflozin (INVOKANA) 300 MG TABS tablet, 300 mg daily before breakfast. , Disp: , Rfl:  .  docusate sodium (COLACE) 100 MG capsule, Take 100 mg by mouth daily as needed for mild constipation., Disp: , Rfl:  .  doxycycline (VIBRA-TABS) 100 MG tablet, Take 1 tablet (100 mg total) by mouth 2 (two) times daily., Disp: 20 tablet, Rfl: 0 .  DULoxetine (CYMBALTA) 60 MG capsule, Take 60 mg by mouth daily. , Disp: , Rfl: 10 .  furosemide (LASIX) 20 MG tablet, Take 20 mg by mouth daily as needed for fluid. , Disp: , Rfl:  .  gabapentin (NEURONTIN) 300 MG capsule, Take 900mg  in the morning and at noon, then take 1200mg  at bedtime, Disp: 300 capsule, Rfl: 11 .  glimepiride (AMARYL) 2 MG tablet, Take 2 mg by mouth daily with breakfast., Disp: , Rfl:  .   glycopyrrolate (ROBINUL) 1 MG tablet, Take 1 mg by mouth 2 (two) times daily. , Disp: , Rfl: 2 .  lidocaine (XYLOCAINE) 5 % ointment, Apply 1 application topically 2 (two) times daily as needed., Disp: 100 g, Rfl: 5 .  lisinopril (PRINIVIL,ZESTRIL) 20 MG tablet, Take 20 mg by mouth daily. , Disp: , Rfl: 4 .  omeprazole (PRILOSEC) 20 MG capsule, 1 PO 30 mins prior to breakfast and supper (Patient taking differently: Take 20 mg by mouth 2 (two) times daily before a meal. 1 PO 30 mins prior to breakfast and supper), Disp: 60 capsule, Rfl: 11 .  oxyCODONE HCl 30 MG TABA, Take 30 mg by mouth every 4 (four) hours as needed (for pain). , Disp: , Rfl: 0 .  primidone (MYSOLINE) 50 MG tablet, Take 1 tablet (50 mg total) by mouth 2 (two) times daily., Disp: 60 tablet, Rfl: 11 .  silver sulfADIAZINE (SILVADENE) 1 % cream, Apply pea-sized amount to wound daily., Disp: 50 g, Rfl: 0 .  VOLTAREN 1 % GEL, Apply 2 g topically 4 (four) times daily. , Disp: , Rfl:  .  zolpidem (AMBIEN) 10 MG tablet, Take 10 mg by mouth at bedtime. , Disp: , Rfl: 3  Social History   Tobacco Use  Smoking Status  Former Smoker  . Packs/day: 2.50  . Years: 25.00  . Pack years: 62.50  . Types: Cigarettes  . Last attempt to quit: 08/05/1984  . Years since quitting: 34.1  Smokeless Tobacco Never Used    No Known Allergies Objective:   There were no vitals filed for this visit. There is no height or weight on file to calculate BMI. Constitutional Well developed. Well nourished.  Vascular Dorsalis pedis pulses palpable bilaterally. Posterior tibial pulses palpable bilaterally. Capillary refill normal to all digits.  No cyanosis or clubbing noted. Pedal hair growth normal.  Neurologic Normal speech. Oriented to person, place, and time. Epicritic sensation to light touch grossly present bilaterally.  Dermatologic Nails well groomed and normal in appearance. No skin lesions. L heel ulcer with slight erythema, warmth,  serosanguinous drainage. Fibrotic wound base. 8/16: 4.5.x5 post-debridement. 8/28: 1x 2.5 post-debridement 9/12: 0.5x1.5 post-debridement. 9/25: 6x2.5 post-debridement. 10/3: 4x2.5 post-debridement. 10/14: 2x1.5 post-debridement. 11/7: 3x3 post-debridement  Orthopedic: Normal joint ROM without pain or crepitus bilaterally. No visible deformities. No bony tenderness.   Radiographs:  9/25: X-rays taken reviewed.  No underlying osseous erosion.  Assessment:   1. 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  L Heel Ulcer -Slightly red today rx Doxy -Debrided as below. -Dressed with medihoney and dry sterile dressing -Refill Silvadene  Procedure: Excisional Debridement of Wound Rationale: Removal of non-viable soft tissue from the wound to promote healing.  Anesthesia: none Pre-Debridement Wound Measurements: 1.5 cm x 2 cm x 0.1 cm  Post-Debridement Wound Measurements: 3 cm x 3 cm x 0.1 cm  Type of Debridement: Sharp Excisional Tissue Removed: Non-viable soft tissue Depth of Debridement: subcutaneous tissue. Technique: Sharp excisional debridement to bleeding, viable wound base.  Dressing: Dry, sterile, compression dressing. Disposition: Patient tolerated procedure well. Patient to return in 1 week for follow-up.   Return in about 1 week (around 09/13/2018) for Wound Care, Left.

## 2018-09-07 ENCOUNTER — Telehealth: Payer: Self-pay | Admitting: *Deleted

## 2018-09-07 MED ORDER — SILVER SULFADIAZINE 1 % EX CREA: TOPICAL_CREAM | CUTANEOUS | 2 refills | Status: DC

## 2018-09-07 NOTE — Telephone Encounter (Signed)
Dr. Randie Heinz the refill of the silvadene cream.

## 2018-09-13 ENCOUNTER — Other Ambulatory Visit: Payer: Medicaid Other

## 2018-09-13 ENCOUNTER — Ambulatory Visit: Payer: Medicaid Other | Admitting: Podiatry

## 2018-09-13 DIAGNOSIS — L97421 Non-pressure chronic ulcer of left heel and midfoot limited to breakdown of skin: Secondary | ICD-10-CM | POA: Diagnosis not present

## 2018-09-13 DIAGNOSIS — E11621 Type 2 diabetes mellitus with foot ulcer: Secondary | ICD-10-CM | POA: Diagnosis not present

## 2018-09-13 DIAGNOSIS — L601 Onycholysis: Secondary | ICD-10-CM

## 2018-09-13 MED ORDER — SILVER SULFADIAZINE 1 % EX CREA: TOPICAL_CREAM | CUTANEOUS | 2 refills | Status: DC

## 2018-09-19 NOTE — Progress Notes (Signed)
Subjective:  Patient ID: Eugene Diaz, male    DOB: 11-23-1965,  MRN: [id number]  No chief complaint on file.   52 y.o. male presents with the above complaint.  States the ulceration is doing much better.  Reports that the right fourth toenail ripped off last week, has been applying ointment.  Review of Systems: Negative except as noted in the HPI. Denies N/V/F/Ch.  Past Medical History:  Diagnosis Date  . Anxiety   . Arthritis   . Asthma   . Depression   . Diabetes mellitus without complication ([place]) 07/4853  . Hypercholesterolemia   . Hypertension   . Neuropathy   . Peripheral vascular disease ([place])     Current Outpatient Medications:  .  albuterol (PROVENTIL HFA;VENTOLIN HFA) 108 (90 BASE) MCG/ACT inhaler, Inhale 2 puffs into the lungs every 6 (six) hours as needed for wheezing or shortness of breath., Disp: 1 Inhaler, Rfl: 2 .  ALPRAZolam (XANAX) 1 MG tablet, Take 1 mg by mouth 4 (four) times daily. , Disp: , Rfl: 4 .  amitriptyline (ELAVIL) 100 MG tablet, Take 100 mg by mouth at bedtime., Disp: , Rfl:  .  atorvastatin (LIPITOR) 40 MG tablet, Take 40 mg by mouth every evening. , Disp: , Rfl: 10 .  canagliflozin (INVOKANA) 300 MG TABS tablet, 300 mg daily before breakfast. , Disp: , Rfl:  .  docusate sodium (COLACE) 100 MG capsule, Take 100 mg by mouth daily as needed for mild constipation., Disp: , Rfl:  .  doxycycline (VIBRA-TABS) 100 MG tablet, Take 1 tablet (100 mg total) by mouth 2 (two) times daily., Disp: 20 tablet, Rfl: 0 .  DULoxetine (CYMBALTA) 60 MG capsule, Take 60 mg by mouth daily. , Disp: , Rfl: 10 .  furosemide (LASIX) 20 MG tablet, Take 20 mg by mouth daily as needed for fluid. , Disp: , Rfl:  .  gabapentin (NEURONTIN) 300 MG capsule, Take 900mg  in the morning and at noon, then take 1200mg  at bedtime, Disp: 300 capsule, Rfl: 11 .  glimepiride (AMARYL) 2 MG tablet, Take 2 mg by mouth daily with breakfast., Disp: , Rfl:  .  glycopyrrolate (ROBINUL) 1 MG tablet,  Take 1 mg by mouth 2 (two) times daily. , Disp: , Rfl: 2 .  lidocaine (XYLOCAINE) 5 % ointment, Apply 1 application topically 2 (two) times daily as needed., Disp: 100 g, Rfl: 5 .  lisinopril (PRINIVIL,ZESTRIL) 20 MG tablet, Take 20 mg by mouth daily. , Disp: , Rfl: 4 .  omeprazole (PRILOSEC) 20 MG capsule, 1 PO 30 mins prior to breakfast and supper (Patient taking differently: Take 20 mg by mouth 2 (two) times daily before a meal. 1 PO 30 mins prior to breakfast and supper), Disp: 60 capsule, Rfl: 11 .  oxyCODONE HCl 30 MG TABA, Take 30 mg by mouth every 4 (four) hours as needed (for pain). , Disp: , Rfl: 0 .  primidone (MYSOLINE) 50 MG tablet, Take 1 tablet (50 mg total) by mouth 2 (two) times daily., Disp: 60 tablet, Rfl: 11 .  silver sulfADIAZINE (SILVADENE) 1 % cream, Apply pea-sized amount to wound daily., Disp: 50 g, Rfl: 2 .  VOLTAREN 1 % GEL, Apply 2 g topically 4 (four) times daily. , Disp: , Rfl:  .  zolpidem (AMBIEN) 10 MG tablet, Take 10 mg by mouth at bedtime. , Disp: , Rfl: 3  Social History   Tobacco Use  Smoking Status Former Smoker  . Packs/day: 2.50  . Years: 25.00  .  Pack years: 62.50  . Types: Cigarettes  . Last attempt to quit: 08/05/1984  . Years since quitting: 34.1  Smokeless Tobacco Never Used    No Known Allergies Objective:   There were no vitals filed for this visit. There is no height or weight on file to calculate BMI. Constitutional Well developed. Well nourished.  Vascular Dorsalis pedis pulses palpable bilaterally. Posterior tibial pulses palpable bilaterally. Capillary refill normal to all digits.  No cyanosis or clubbing noted. Pedal hair growth normal.  Neurologic Normal speech. Oriented to person, place, and time. Epicritic sensation to light touch grossly present bilaterally.  Dermatologic Nails well groomed and normal in appearance. No skin lesions. L heel ulcer with slight erythema, warmth, serosanguinous drainage. Fibrotic wound  base. 8/16: 4.5.x5 post-debridement. 8/28: 1x 2.5 post-debridement 9/12: 0.5x1.5 post-debridement. 9/25: 6x2.5 post-debridement. 10/3: 4x2.5 post-debridement. 10/14: 2x1.5 post-debridement. 11/7: 3x3 post-debridement 11/14: 2x2 post-debridement   Wound to the right fourth toenail area with intact nail bed.  Orthopedic: Normal joint ROM without pain or crepitus bilaterally. No visible deformities. No bony tenderness.   Radiographs:  9/25: X-rays taken reviewed.  No underlying osseous erosion.  Assessment:   1. 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 Onycholysis    Plan:  Patient was evaluated and treated and all questions answered.  L Heel Ulcer -Debrided as below. -Dressed with medihoney and dry sterile dressing -Refill Silvadene  Procedure: Excisional Debridement of Wound Rationale: Removal of non-viable soft tissue from the wound to promote healing.  Anesthesia: none Pre-Debridement Wound Measurements: 1.5 cm x 1.5 cm x 0.1 cm  Post-Debridement Wound Measurements: 2 cm x 2 cm x 0.1 cm  Type of Debridement: Sharp Excisional Tissue Removed: Non-viable soft tissue Depth of Debridement: subcutaneous tissue. Technique: Sharp excisional debridement to bleeding, viable wound base.  Dressing: Dry, sterile, compression dressing. Disposition: Patient tolerated procedure well. Patient to return in 1 week for follow-up.  Onycholysis right fourth toenail -Nailbed intact -Apply Silvadene and dressing daily   Return in about 3 weeks (around 10/04/2018) for Wound Care, Left.

## 2018-10-04 ENCOUNTER — Ambulatory Visit: Payer: Medicaid Other | Admitting: Podiatry

## 2018-10-04 DIAGNOSIS — L97421 Non-pressure chronic ulcer of left heel and midfoot limited to breakdown of skin: Secondary | ICD-10-CM | POA: Diagnosis not present

## 2018-10-04 DIAGNOSIS — E11621 Type 2 diabetes mellitus with foot ulcer: Secondary | ICD-10-CM

## 2018-10-04 NOTE — Progress Notes (Signed)
Subjective:  Patient ID: Eugene Diaz, male    DOB: 01-12-1966,  MRN: [id number]  Chief Complaint  Patient presents with  . Diabetic Ulcer    left heel - looking better  . Nail Problem    asking for nail trim today - patient tries to do it himself, but sometimes cuts way too short    52 y.o. male presents with the above complaint. States the heel ulcer is doing better.  Review of Systems: Negative except as noted in the HPI. Denies N/V/F/Ch.  Past Medical History:  Diagnosis Date  . Anxiety   . Arthritis   . Asthma   . Depression   . Diabetes mellitus without complication ([place]) 02/1024  . Hypercholesterolemia   . Hypertension   . Neuropathy   . Peripheral vascular disease ([place])     Current Outpatient Medications:  .  albuterol (PROVENTIL HFA;VENTOLIN HFA) 108 (90 BASE) MCG/ACT inhaler, Inhale 2 puffs into the lungs every 6 (six) hours as needed for wheezing or shortness of breath., Disp: 1 Inhaler, Rfl: 2 .  ALPRAZolam (XANAX) 1 MG tablet, Take 1 mg by mouth 4 (four) times daily. , Disp: , Rfl: 4 .  amitriptyline (ELAVIL) 100 MG tablet, Take 100 mg by mouth at bedtime., Disp: , Rfl:  .  atorvastatin (LIPITOR) 40 MG tablet, Take 40 mg by mouth every evening. , Disp: , Rfl: 10 .  canagliflozin (INVOKANA) 300 MG TABS tablet, 300 mg daily before breakfast. , Disp: , Rfl:  .  docusate sodium (COLACE) 100 MG capsule, Take 100 mg by mouth daily as needed for mild constipation., Disp: , Rfl:  .  doxycycline (VIBRA-TABS) 100 MG tablet, Take 1 tablet (100 mg total) by mouth 2 (two) times daily., Disp: 20 tablet, Rfl: 0 .  DULoxetine (CYMBALTA) 60 MG capsule, Take 60 mg by mouth daily. , Disp: , Rfl: 10 .  furosemide (LASIX) 20 MG tablet, Take 20 mg by mouth daily as needed for fluid. , Disp: , Rfl:  .  gabapentin (NEURONTIN) 300 MG capsule, Take 900mg  in the morning and at noon, then take 1200mg  at bedtime, Disp: 300 capsule, Rfl: 11 .  glimepiride (AMARYL) 2 MG tablet, Take 2 mg by  mouth daily with breakfast., Disp: , Rfl:  .  glycopyrrolate (ROBINUL) 1 MG tablet, Take 1 mg by mouth 2 (two) times daily. , Disp: , Rfl: 2 .  lidocaine (XYLOCAINE) 5 % ointment, Apply 1 application topically 2 (two) times daily as needed., Disp: 100 g, Rfl: 5 .  lisinopril (PRINIVIL,ZESTRIL) 20 MG tablet, Take 20 mg by mouth daily. , Disp: , Rfl: 4 .  omeprazole (PRILOSEC) 20 MG capsule, 1 PO 30 mins prior to breakfast and supper (Patient taking differently: Take 20 mg by mouth 2 (two) times daily before a meal. 1 PO 30 mins prior to breakfast and supper), Disp: 60 capsule, Rfl: 11 .  oxyCODONE HCl 30 MG TABA, Take 30 mg by mouth every 4 (four) hours as needed (for pain). , Disp: , Rfl: 0 .  primidone (MYSOLINE) 50 MG tablet, Take 1 tablet (50 mg total) by mouth 2 (two) times daily., Disp: 60 tablet, Rfl: 11 .  silver sulfADIAZINE (SILVADENE) 1 % cream, Apply pea-sized amount to wound daily., Disp: 50 g, Rfl: 2 .  VOLTAREN 1 % GEL, Apply 2 g topically 4 (four) times daily. , Disp: , Rfl:  .  zolpidem (AMBIEN) 10 MG tablet, Take 10 mg by mouth at bedtime. , Disp: ,  Rfl: 3  Social History   Tobacco Use  Smoking Status Former Smoker  . Packs/day: 2.50  . Years: 25.00  . Pack years: 62.50  . Types: Cigarettes  . Last attempt to quit: 08/05/1984  . Years since quitting: 34.1  Smokeless Tobacco Never Used    No Known Allergies Objective:   There were no vitals filed for this visit. There is no height or weight on file to calculate BMI. Constitutional Well developed. Well nourished.  Vascular Dorsalis pedis pulses palpable bilaterally. Posterior tibial pulses palpable bilaterally. Capillary refill normal to all digits.  No cyanosis or clubbing noted. Pedal hair growth normal.  Neurologic Normal speech. Oriented to person, place, and time. Epicritic sensation to light touch grossly present bilaterally.  Dermatologic Nails well groomed and normal in appearance. No skin lesions. L  heel ulcer with slight erythema, warmth, serosanguinous drainage. Fibrotic wound base. 8/16: 4.5.x5 post-debridement. 8/28: 1x 2.5 post-debridement 9/12: 0.5x1.5 post-debridement. 9/25: 6x2.5 post-debridement. 10/3: 4x2.5 post-debridement. 10/14: 2x1.5 post-debridement. 11/7: 3x3 post-debridement 11/14: 2x2 post-debridement 12/5: 1x2 post-debridmeent  Orthopedic: Normal joint ROM without pain or crepitus bilaterally. No visible deformities. No bony tenderness.   Radiographs:  9/25: X-rays taken reviewed.  No underlying osseous erosion.  Assessment:   1. 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  L Heel Ulcer -Debrided as below. -Dressed with povidone and dry sterile dressing  Procedure: Selective Debridement of Wound Rationale: Removal of devitalized tissue from the wound to promote healing.  Pre-Debridement Wound Measurements: 1 cm x 2 cm x 0.2 cm  Post-Debridement Wound Measurements: same as pre-debridement. Type of Debridement: sharp selective Tissue Removed: Devitalized soft-tissue Dressing: Dry, sterile, compression dressing. Disposition: Patient tolerated procedure well. Patient to return in 1 week for follow-up.   Return in about 1 month (around 11/04/2018) for Wound Care, Left heel.

## 2018-10-10 ENCOUNTER — Encounter (HOSPITAL_COMMUNITY): Payer: Self-pay | Admitting: Emergency Medicine

## 2018-10-10 ENCOUNTER — Other Ambulatory Visit: Payer: Self-pay

## 2018-10-10 ENCOUNTER — Emergency Department (HOSPITAL_COMMUNITY)
Admission: EM | Admit: 2018-10-10 | Discharge: 2018-10-10 | Disposition: A | Discharge status: Home / Self Care | Payer: Medicaid Other | Attending: Emergency Medicine | Admitting: Emergency Medicine

## 2018-10-10 ENCOUNTER — Emergency Department (HOSPITAL_COMMUNITY): Payer: Medicaid Other

## 2018-10-10 DIAGNOSIS — Y9389 Activity, other specified: Secondary | ICD-10-CM | POA: Insufficient documentation

## 2018-10-10 DIAGNOSIS — W19XXXA Unspecified fall, initial encounter: Secondary | ICD-10-CM

## 2018-10-10 DIAGNOSIS — F1010 Alcohol abuse, uncomplicated: Secondary | ICD-10-CM | POA: Diagnosis not present

## 2018-10-10 DIAGNOSIS — F329 Major depressive disorder, single episode, unspecified: Secondary | ICD-10-CM | POA: Insufficient documentation

## 2018-10-10 DIAGNOSIS — Y998 Other external cause status: Secondary | ICD-10-CM | POA: Insufficient documentation

## 2018-10-10 DIAGNOSIS — F419 Anxiety disorder, unspecified: Secondary | ICD-10-CM | POA: Insufficient documentation

## 2018-10-10 DIAGNOSIS — S20211A Contusion of right front wall of thorax, initial encounter: Secondary | ICD-10-CM | POA: Diagnosis not present

## 2018-10-10 DIAGNOSIS — Z79899 Other long term (current) drug therapy: Secondary | ICD-10-CM | POA: Diagnosis not present

## 2018-10-10 DIAGNOSIS — E119 Type 2 diabetes mellitus without complications: Secondary | ICD-10-CM | POA: Diagnosis not present

## 2018-10-10 DIAGNOSIS — I10 Essential (primary) hypertension: Secondary | ICD-10-CM | POA: Diagnosis not present

## 2018-10-10 DIAGNOSIS — J45909 Unspecified asthma, uncomplicated: Secondary | ICD-10-CM | POA: Diagnosis not present

## 2018-10-10 DIAGNOSIS — S298XXA Other specified injuries of thorax, initial encounter: Secondary | ICD-10-CM

## 2018-10-10 DIAGNOSIS — W01198A Fall on same level from slipping, tripping and stumbling with subsequent striking against other object, initial encounter: Secondary | ICD-10-CM | POA: Diagnosis not present

## 2018-10-10 DIAGNOSIS — Z87891 Personal history of nicotine dependence: Secondary | ICD-10-CM | POA: Insufficient documentation

## 2018-10-10 DIAGNOSIS — Y929 Unspecified place or not applicable: Secondary | ICD-10-CM | POA: Insufficient documentation

## 2018-10-10 DIAGNOSIS — Z7984 Long term (current) use of oral hypoglycemic drugs: Secondary | ICD-10-CM | POA: Insufficient documentation

## 2018-10-10 DIAGNOSIS — S299XXA Unspecified injury of thorax, initial encounter: Secondary | ICD-10-CM | POA: Diagnosis present

## 2018-10-10 MED ORDER — HYDROMORPHONE HCL 1 MG/ML IJ SOLN
1.0000 mg | Freq: Once | INTRAMUSCULAR | Status: AC
  Administered 2018-10-10: 1 mg via INTRAMUSCULAR
  Filled 2018-10-10: qty 1

## 2018-10-10 MED ORDER — DOXYCYCLINE HYCLATE 100 MG PO TABS: 100.0000 mg | ORAL_TABLET | Freq: Two times a day (BID) | ORAL | 0 refills | Status: DC

## 2018-10-10 NOTE — ED Triage Notes (Signed)
Patient states he fell on Sunday onto a metal pallet. Complaining of pain to right rib cage.

## 2018-10-10 NOTE — Discharge Instructions (Addendum)
Call your primary provider to arrange a follow-up appointment.  Use a pillow or folded towel to hold pressure to your side to cough and take deep breaths several times throughout the day.  Turn to the ER for any worsening symptoms such as increasing pain or sudden shortness of breath.

## 2018-10-12 NOTE — ED Provider Notes (Signed)
[facility] EMERGENCY DEPARTMENT Provider Note   CSN: [id number] Arrival date & time: 10/10/18  1404     History   Chief Complaint Chief Complaint  Patient presents with  . Fall    HPI Eugene Diaz is a 52 y.o. male.  HPI   Eugene Diaz is a 52 y.o. male who presents to the Emergency Department complaining of right sided rib pain secondary to a mechanical fall.  He states that he tripped outside landing on a metal pallet.  Occurred 3 days prior to arrival.  He describes sharp pain to his lateral right chest.  Pain associated with movement, palpation and deep breathing.  Pain improves at rest.  He denies head injury, LOC, neck or back pain.  No shortness of breath, abdominal pain and hemoptysis.      Past Medical History:  Diagnosis Date  . Anxiety   . Arthritis   . Asthma   . Depression   . Diabetes mellitus without complication ([place]) 05/5461  . Hypercholesterolemia   . Hypertension   . Neuropathy   . Peripheral vascular disease [facility])     Patient Active Problem List   Diagnosis Date Noted  . Hepatic steatosis 06/18/2018  . Gastritis, erosive   . Dysphagia, idiopathic 12/27/2017  . Rectal bleeding 12/27/2017  . Intention tremor 06/03/2016  . Left knee pain 12/04/2015  . Alcoholic peripheral neuropathy ([place]) 10/08/2014  . Diabetic neuropathy ([place]) 10/08/2014  . Chest pain, rule out acute myocardial infarction 03/10/2014  . Thrombocytopenia ([place]) 03/10/2014  . ETOH abuse 03/10/2014  . Chest pain 03/10/2014  . Diabetes mellitus without complication ([place])   . Hypertension   . Hypercholesterolemia     Past Surgical History:  Procedure Laterality Date  . BIOPSY  02/13/2018   Procedure: BIOPSY;  Surgeon: Danie Binder, MD;  Location: [facility] ENDO SUITE;  Service: Endoscopy;;  transverse colon biopsy, gastric biopsy  . COLONOSCOPY WITH PROPOFOL N/A 02/13/2018   Procedure: COLONOSCOPY WITH PROPOFOL;  Surgeon: Danie Binder, MD;  Location: [facility] ENDO SUITE;   Service: Endoscopy;  Laterality: N/A;  11:15am  . ESOPHAGOGASTRODUODENOSCOPY (EGD) WITH PROPOFOL N/A 02/13/2018   Procedure: ESOPHAGOGASTRODUODENOSCOPY (EGD) WITH PROPOFOL;  Surgeon: Danie Binder, MD;  Location: [facility] ENDO SUITE;  Service: Endoscopy;  Laterality: N/A;  . Left arm     Left arm repair (tendon and artery)  . PILONIDAL CYST EXCISION N/A 08/08/2014   Procedure: CYST EXCISION PILONIDAL EXTENSIVE;  Surgeon: Jamesetta So, MD;  Location: [facility] ORS;  Service: General;  Laterality: N/A;  . POLYPECTOMY  02/13/2018   Procedure: POLYPECTOMY;  Surgeon: Danie Binder, MD;  Location: [facility] ENDO SUITE;  Service: Endoscopy;;  transverse colon polyp hs, rectal polyps times 2  . SAVORY DILATION N/A 02/13/2018   Procedure: SAVORY DILATION;  Surgeon: Danie Binder, MD;  Location: [facility] ENDO SUITE;  Service: Endoscopy;  Laterality: N/A;        Home Medications    Prior to Admission medications   Medication Sig Start Date End Date Taking? Authorizing Provider  albuterol (PROVENTIL HFA;VENTOLIN HFA) 108 (90 BASE) MCG/ACT inhaler Inhale 2 puffs into the lungs every 6 (six) hours as needed for wheezing or shortness of breath. 03/11/14  Yes Black, Lezlie Octave, NP  ALPRAZolam Duanne Moron) 1 MG tablet Take 1 mg by mouth 4 (four) times daily.  12/17/15  Yes [provider]  amitriptyline (ELAVIL) 100 MG tablet Take 100 mg by mouth at bedtime.   Yes [provider]  atorvastatin (LIPITOR) 40 MG tablet Take 40 mg by mouth every evening.  12/17/15  Yes [provider]  docusate sodium (COLACE) 100 MG capsule Take 100 mg by mouth daily as needed for mild constipation.   Yes [provider]  canagliflozin (INVOKANA) 300 MG TABS tablet 300 mg daily before breakfast.  11/16/15   [provider]  doxycycline (VIBRA-TABS) 100 MG tablet Take 1 tablet (100 mg total) by mouth 2 (two) times daily. 10/10/18   Evelina Bucy, DPM  DULoxetine (CYMBALTA) 60 MG capsule Take 60 mg by mouth daily.   12/11/15   [provider]  furosemide (LASIX) 20 MG tablet Take 20 mg by mouth daily as needed for fluid.     [provider]  gabapentin (NEURONTIN) 300 MG capsule Take 900mg  in the morning and at noon, then take 1200mg  at bedtime 06/11/18   Narda Amber K, DO  glimepiride (AMARYL) 2 MG tablet Take 2 mg by mouth daily with breakfast.    [provider]  glycopyrrolate (ROBINUL) 1 MG tablet Take 1 mg by mouth 2 (two) times daily.  12/17/15   [provider]  lidocaine (XYLOCAINE) 5 % ointment Apply 1 application topically 2 (two) times daily as needed. 03/23/16   Patel, Arvin Collard K, DO  lisinopril (PRINIVIL,ZESTRIL) 20 MG tablet Take 20 mg by mouth daily.  12/30/15   [provider]  omeprazole (PRILOSEC) 20 MG capsule 1 PO 30 mins prior to breakfast and supper Patient taking differently: Take 20 mg by mouth 2 (two) times daily before a meal. 1 PO 30 mins prior to breakfast and supper 02/13/18   Fields, Marga Melnick, MD  oxyCODONE HCl 30 MG TABA Take 30 mg by mouth every 4 (four) hours as needed (for pain).  12/07/15   [provider]  primidone (MYSOLINE) 50 MG tablet Take 1 tablet (50 mg total) by mouth 2 (two) times daily. 06/11/18   Narda Amber K, DO  silver sulfADIAZINE (SILVADENE) 1 % cream Apply pea-sized amount to wound daily. 09/13/18   Evelina Bucy, DPM  VOLTAREN 1 % GEL Apply 2 g topically 4 (four) times daily.  08/19/15   [provider]  zolpidem (AMBIEN) 10 MG tablet Take 10 mg by mouth at bedtime.  12/02/15   [provider]    Family History Family History  Problem Relation Age of Onset  . Heart attack Mother        Living, 61  . Cancer Father        Deceased  . Healthy Brother   . Healthy Sister   . Colon cancer Neg Hx   . Colon polyps Neg Hx     Social History Social History   Tobacco Use  . Smoking status: Former Smoker    Packs/day: 2.50    Years: 25.00    Pack years: 62.50    Types: Cigarettes     Last attempt to quit: 08/05/1984    Years since quitting: 34.2  . Smokeless tobacco: Never Used  Substance Use Topics  . Alcohol use: Yes    Alcohol/week: 50.0 standard drinks    Types: 50 Standard drinks or equivalent per week    Comment: As of 06/18/2018: Drinks 6 beers, liquor 4-6oz (both daily) x 35 years  . Drug use: Not Currently    Types: Marijuana    Comment: occasionally      Allergies   Patient has no known allergies.   Review of Systems Review of  Systems  Constitutional: Negative for appetite change, chills and fever.  HENT: Negative for congestion, sore throat and trouble swallowing.   Respiratory: Negative for cough, chest tightness, shortness of breath and wheezing.   Cardiovascular: Positive for chest pain (lateral right chest wall pain).  Gastrointestinal: Negative for abdominal pain, nausea and vomiting.  Genitourinary: Negative for dysuria.  Musculoskeletal: Negative for arthralgias, back pain and neck pain.  Skin: Negative for rash.  Neurological: Negative for dizziness, syncope, weakness and numbness.  Hematological: Negative for adenopathy.  All other systems reviewed and are negative.    Physical Exam Updated Vital Signs BP 138/81 (BP Location: Right Arm)   Pulse 94   Temp 98.5 F (36.9 C) (Oral)   Resp 20   Ht 6\' 2"  (1.88 m)   Wt 129.3 kg   SpO2 98%   BMI 36.59 kg/m   Physical Exam Vitals signs and nursing note reviewed.  Constitutional:      General: He is not in acute distress.    Appearance: He is not diaphoretic.  HENT:     Head: Atraumatic.     Mouth/Throat:     Mouth: Mucous membranes are moist.     Pharynx: Oropharynx is clear. No oropharyngeal exudate.  Neck:     Musculoskeletal: Normal range of motion. No muscular tenderness.  Cardiovascular:     Rate and Rhythm: Normal rate and regular rhythm.     Pulses: Normal pulses.  Pulmonary:     Effort: Pulmonary effort is normal.     Breath sounds: Normal breath sounds.  Chest:      Chest wall: Tenderness (focal ttp of the lateral right chest wall.  no abrasions or crepitus.  ) present.  Abdominal:     Palpations: Abdomen is soft.     Tenderness: There is no abdominal tenderness. There is no guarding.  Musculoskeletal: Normal range of motion.  Skin:    General: Skin is warm.     Capillary Refill: Capillary refill takes less than 2 seconds.  Neurological:     General: No focal deficit present.     Mental Status: He is alert.      ED Treatments / Results  Labs (all labs ordered are listed, but only abnormal results are displayed) Labs Reviewed - No data to display  EKG None  Radiology No results found.  Procedures Procedures (including critical care time)  Medications Ordered in ED Medications  HYDROmorphone (DILAUDID) injection 1 mg (1 mg Intramuscular Given 10/10/18 1627)     Initial Impression / Assessment and Plan / ED Course  I have reviewed the triage vital signs and the nursing notes.  Pertinent labs & imaging results that were available during my care of the patient were reviewed by me and considered in my medical decision making (see chart for details).     Pt with right rib pain secondary to a mechanical fall.  XR neg for fx.  Vitals reassuring.  Pain improved.  He has pain medication at home.  Agrees to tx plan and close out pt f/u.  Return precautions discussed.   Final Clinical Impressions(s) / ED Diagnoses   Final diagnoses:  Fall, initial encounter  Contusion of rib on right side, initial encounter    ED Discharge Orders    None       Bufford Lope 10/12/18 2330    Isla Pence, MD 10/15/18 [phone]

## 2018-10-31 ENCOUNTER — Encounter (HOSPITAL_COMMUNITY): Payer: Self-pay | Admitting: Emergency Medicine

## 2018-10-31 ENCOUNTER — Emergency Department (HOSPITAL_COMMUNITY)
Admission: EM | Admit: 2018-10-31 | Discharge: 2018-10-31 | Disposition: A | Discharge status: Home / Self Care | Payer: Medicaid Other | Attending: Emergency Medicine | Admitting: Emergency Medicine

## 2018-10-31 ENCOUNTER — Emergency Department (HOSPITAL_COMMUNITY): Payer: Medicaid Other

## 2018-10-31 DIAGNOSIS — J441 Chronic obstructive pulmonary disease with (acute) exacerbation: Secondary | ICD-10-CM | POA: Diagnosis not present

## 2018-10-31 DIAGNOSIS — J209 Acute bronchitis, unspecified: Secondary | ICD-10-CM

## 2018-10-31 DIAGNOSIS — Z79899 Other long term (current) drug therapy: Secondary | ICD-10-CM | POA: Diagnosis not present

## 2018-10-31 DIAGNOSIS — I10 Essential (primary) hypertension: Secondary | ICD-10-CM | POA: Diagnosis not present

## 2018-10-31 DIAGNOSIS — R0602 Shortness of breath: Secondary | ICD-10-CM | POA: Insufficient documentation

## 2018-10-31 DIAGNOSIS — R05 Cough: Secondary | ICD-10-CM | POA: Diagnosis present

## 2018-10-31 DIAGNOSIS — Z872 Personal history of diseases of the skin and subcutaneous tissue: Secondary | ICD-10-CM

## 2018-10-31 DIAGNOSIS — F17210 Nicotine dependence, cigarettes, uncomplicated: Secondary | ICD-10-CM | POA: Diagnosis not present

## 2018-10-31 DIAGNOSIS — E119 Type 2 diabetes mellitus without complications: Secondary | ICD-10-CM | POA: Diagnosis not present

## 2018-10-31 DIAGNOSIS — Z8739 Personal history of other diseases of the musculoskeletal system and connective tissue: Secondary | ICD-10-CM

## 2018-10-31 DIAGNOSIS — Z7984 Long term (current) use of oral hypoglycemic drugs: Secondary | ICD-10-CM | POA: Diagnosis not present

## 2018-10-31 HISTORY — DX: Personal history of diseases of the skin and subcutaneous tissue: Z87.2

## 2018-10-31 HISTORY — DX: Personal history of other diseases of the musculoskeletal system and connective tissue: Z87.39

## 2018-10-31 LAB — BASIC METABOLIC PANEL
Anion gap: 7 (ref 5–15)
BUN: 10 mg/dL (ref 6–20)
CO2: 28 mmol/L (ref 22–32)
Calcium: 9.2 mg/dL (ref 8.9–10.3)
Chloride: 99 mmol/L (ref 98–111)
Creatinine, Ser: 0.95 mg/dL (ref 0.61–1.24)
GFR calc Af Amer: >60 mL/min (ref >60)
GFR calc non Af Amer: >60 mL/min (ref >60)
Glucose, Bld: 381 mg/dL — ABNORMAL HIGH (ref 70–99)
Potassium: 4.5 mmol/L (ref 3.5–5.1)
Sodium: 134 mmol/L — ABNORMAL LOW (ref 135–145)

## 2018-10-31 LAB — CBC WITH DIFFERENTIAL/PLATELET
Abs Immature Granulocytes: 0.02 10*3/uL (ref 0.00–0.07)
Basophils Absolute: 0.1 10*3/uL (ref 0.0–0.1)
Basophils Relative: 1 %
Eosinophils Absolute: 1 10*3/uL — ABNORMAL HIGH (ref 0.0–0.5)
Eosinophils Relative: 17 %
HCT: 40.1 % (ref 39.0–52.0)
Hemoglobin: 12.9 g/dL — ABNORMAL LOW (ref 13.0–17.0)
Immature Granulocytes: 0 %
Lymphocytes Relative: 31 %
Lymphs Abs: 1.8 10*3/uL (ref 0.7–4.0)
MCH: 32.4 pg (ref 26.0–34.0)
MCHC: 32.2 g/dL (ref 30.0–36.0)
MCV: 100.8 fL — ABNORMAL HIGH (ref 80.0–100.0)
Monocytes Absolute: 0.3 10*3/uL (ref 0.1–1.0)
Monocytes Relative: 5 %
Neutro Abs: 2.6 10*3/uL (ref 1.7–7.7)
Neutrophils Relative %: 46 %
Platelets: 168 10*3/uL (ref 150–400)
RBC: 3.98 MIL/uL — ABNORMAL LOW (ref 4.22–5.81)
RDW: 13.5 % (ref 11.5–15.5)
WBC: 5.8 10*3/uL (ref 4.0–10.5)
nRBC: 0 % (ref 0.0–0.2)

## 2018-10-31 LAB — TROPONIN I: Troponin I: <0.03 ng/mL (ref <0.03)

## 2018-10-31 LAB — BRAIN NATRIURETIC PEPTIDE: B Natriuretic Peptide: 17 pg/mL (ref 0.0–100.0)

## 2018-10-31 MED ORDER — PREDNISONE 50 MG PO TABS
60.0000 mg | ORAL_TABLET | Freq: Once | ORAL | Status: AC
  Administered 2018-10-31: 60 mg via ORAL
  Filled 2018-10-31: qty 1

## 2018-10-31 MED ORDER — MAGNESIUM SULFATE 2 GM/50ML IV SOLN
2.0000 g | Freq: Once | INTRAVENOUS | Status: AC
  Administered 2018-10-31: 2 g via INTRAVENOUS
  Filled 2018-10-31: qty 50

## 2018-10-31 MED ORDER — ALBUTEROL (5 MG/ML) CONTINUOUS INHALATION SOLN
5.0000 mg/h | INHALATION_SOLUTION | Freq: Once | RESPIRATORY_TRACT | Status: AC
  Administered 2018-10-31: 5 mg/h via RESPIRATORY_TRACT
  Filled 2018-10-31: qty 20

## 2018-10-31 MED ORDER — IPRATROPIUM-ALBUTEROL 0.5-2.5 (3) MG/3ML IN SOLN
3.0000 mL | Freq: Once | RESPIRATORY_TRACT | Status: AC
  Administered 2018-10-31: 3 mL via RESPIRATORY_TRACT
  Filled 2018-10-31: qty 3

## 2018-10-31 MED ORDER — ALBUTEROL SULFATE HFA 108 (90 BASE) MCG/ACT IN AERS: 2.0000 | INHALATION_SPRAY | Freq: Four times a day (QID) | RESPIRATORY_TRACT | 2 refills | Status: DC | PRN

## 2018-10-31 MED ORDER — AZITHROMYCIN 250 MG PO TABS: 250.0000 mg | ORAL_TABLET | Freq: Every day | ORAL | 0 refills | Status: DC

## 2018-10-31 MED ORDER — AZITHROMYCIN 250 MG PO TABS
500.0000 mg | ORAL_TABLET | Freq: Once | ORAL | Status: AC
  Administered 2018-10-31: 500 mg via ORAL
  Filled 2018-10-31: qty 2

## 2018-10-31 NOTE — ED Triage Notes (Signed)
Pt states he has had a dry, hacking cough for about 2 weeks.  Audible wheezing noted at triage.  Last used inhaler last night.

## 2018-10-31 NOTE — Discharge Instructions (Addendum)
You were seen in the emergency department for 2 weeks of increased shortness of breath and cough.  Your chest x-ray did not show any pneumonia and your blood work did not show obvious signs of heart injury.  We are prescribing some antibiotics and a refill on your inhaler.  It is possible that you may need to be put on steroids so please follow-up with your doctor for reevaluation as soon as possible.  Return if any worsening symptoms.

## 2018-10-31 NOTE — ED Provider Notes (Signed)
[facility] EMERGENCY DEPARTMENT Provider Note   CSN: [id number] Arrival date & time: 10/31/18  1224     History   Chief Complaint Chief Complaint  Patient presents with  . Cough    HPI Eugene Diaz is a 53 y.o. male.  He has multiple medical problems including diabetes hypertension peripheral neuropathy.  He is coming in here with 2 weeks of a cough and shortness of breath.  He said he has been wheezing and when he coughs sometimes he will get up a little bit of sputum.  No fevers or chills that he knows of.  He has been trying an inhaler without any relief.  He has chest pain when he coughs.  He does not think he has COPD or CHF but he is on diuretic.  He said he ran out of some of his medications recently and will be able to get him until the third.  It sounds like he still is taking his diuretic.  The history is provided by the patient.  Cough  This is a new problem. The current episode started more than 1 week ago. The problem occurs constantly. The problem has not changed since onset.The cough is non-productive. There has been no fever. Associated symptoms include chest pain, sweats, shortness of breath and wheezing. Pertinent negatives include no headaches, no rhinorrhea and no sore throat. The treatment provided mild relief. He is not a smoker (former).    Past Medical History:  Diagnosis Date  . Anxiety   . Arthritis   . Asthma   . Depression   . Diabetes mellitus without complication ([place]) 02/8098  . Hypercholesterolemia   . Hypertension   . Neuropathy   . Peripheral vascular disease [facility])     Patient Active Problem List   Diagnosis Date Noted  . Hepatic steatosis 06/18/2018  . Gastritis, erosive   . Dysphagia, idiopathic 12/27/2017  . Rectal bleeding 12/27/2017  . Intention tremor 06/03/2016  . Left knee pain 12/04/2015  . 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 Diabetic neuropathy ([place]) 10/08/2014  . Chest pain, rule out acute myocardial  infarction 03/10/2014  . Thrombocytopenia ([place]) 03/10/2014  . ETOH abuse 03/10/2014  . Chest pain 03/10/2014  . Diabetes mellitus without complication ([place])   . Hypertension   . Hypercholesterolemia     Past Surgical History:  Procedure Laterality Date  . BIOPSY  02/13/2018   Procedure: BIOPSY;  Surgeon: Danie Binder, MD;  Location: [facility] ENDO SUITE;  Service: Endoscopy;;  transverse colon biopsy, gastric biopsy  . COLONOSCOPY WITH PROPOFOL N/A 02/13/2018   Procedure: COLONOSCOPY WITH PROPOFOL;  Surgeon: Danie Binder, MD;  Location: [facility] ENDO SUITE;  Service: Endoscopy;  Laterality: N/A;  11:15am  . ESOPHAGOGASTRODUODENOSCOPY (EGD) WITH PROPOFOL N/A 02/13/2018   Procedure: ESOPHAGOGASTRODUODENOSCOPY (EGD) WITH PROPOFOL;  Surgeon: Danie Binder, MD;  Location: [facility] ENDO SUITE;  Service: Endoscopy;  Laterality: N/A;  . Left arm     Left arm repair (tendon and artery)  . PILONIDAL CYST EXCISION N/A 08/08/2014   Procedure: CYST EXCISION PILONIDAL EXTENSIVE;  Surgeon: Jamesetta So, MD;  Location: [facility] ORS;  Service: General;  Laterality: N/A;  . POLYPECTOMY  02/13/2018   Procedure: POLYPECTOMY;  Surgeon: Danie Binder, MD;  Location: [facility] ENDO SUITE;  Service: Endoscopy;;  transverse colon polyp hs, rectal polyps times 2  . SAVORY DILATION N/A 02/13/2018   Procedure: SAVORY DILATION;  Surgeon: Danie Binder, MD;  Location: [facility] END[facility]ITE;  Service: Endoscopy;  Laterality: N/A;        Home Medications    Prior to Admission medications   Medication Sig Start Date End Date Taking? Authorizing Provider  albuterol (PROVENTIL HFA;VENTOLIN HFA) 108 (90 BASE) MCG/ACT inhaler Inhale 2 puffs into the lungs every 6 (six) hours as needed for wheezing or shortness of breath. 03/11/14   Black, Lezlie Octave, NP  ALPRAZolam Duanne Moron) 1 MG tablet Take 1 mg by mouth 4 (four) times daily.  12/17/15   [provider]  amitriptyline (ELAVIL) 100 MG tablet Take 100 mg by mouth at bedtime.    [provider]  atorvastatin (LIPITOR) 40 MG tablet Take 40 mg by mouth every evening.  12/17/15   [provider]  canagliflozin (INVOKANA) 300 MG TABS tablet 300 mg daily before breakfast.  11/16/15   [provider]  docusate sodium (COLACE) 100 MG capsule Take 100 mg by mouth daily as needed for mild constipation.    [provider]  doxycycline (VIBRA-TABS) 100 MG tablet Take 1 tablet (100 mg total) by mouth 2 (two) times daily. 10/10/18   Evelina Bucy, DPM  DULoxetine (CYMBALTA) 60 MG capsule Take 60 mg by mouth daily.  12/11/15   [provider]  furosemide (LASIX) 20 MG tablet Take 20 mg by mouth daily as needed for fluid.     [provider]  gabapentin (NEURONTIN) 300 MG capsule Take 900mg  in the morning and at noon, then take 1200mg  at bedtime 06/11/18   Narda Amber K, DO  glimepiride (AMARYL) 2 MG tablet Take 2 mg by mouth daily with breakfast.    [provider]  glycopyrrolate (ROBINUL) 1 MG tablet Take 1 mg by mouth 2 (two) times daily.  12/17/15   [provider]  lidocaine (XYLOCAINE) 5 % ointment Apply 1 application topically 2 (two) times daily as needed. 03/23/16   Patel, Arvin Collard K, DO  lisinopril (PRINIVIL,ZESTRIL) 20 MG tablet Take 20 mg by mouth daily.  12/30/15   [provider]  omeprazole (PRILOSEC) 20 MG capsule 1 PO 30 mins prior to breakfast and supper Patient taking differently: Take 20 mg by mouth 2 (two) times daily before a meal. 1 PO 30 mins prior to breakfast and supper 02/13/18   Fields, Marga Melnick, MD  oxyCODONE HCl 30 MG TABA Take 30 mg by mouth every 4 (four) hours as needed (for pain).  12/07/15   [provider]  primidone (MYSOLINE) 50 MG tablet Take 1 tablet (50 mg total) by mouth 2 (two) times daily. 06/11/18   Narda Amber K, DO  silver sulfADIAZINE (SILVADENE) 1 % cream Apply pea-sized amount to wound daily. 09/13/18   Evelina Bucy, DPM  VOLTAREN 1 % GEL Apply 2 g topically 4  (four) times daily.  08/19/15   [provider]  zolpidem (AMBIEN) 10 MG tablet Take 10 mg by mouth at bedtime.  12/02/15   [provider]    Family History Family History  Problem Relation Age of Onset  . Heart attack Mother        Living, 45  . Cancer Father        Deceased  . Healthy Brother   . Healthy Sister   . Colon cancer Neg Hx   . Colon polyps Neg Hx     Social History Social History   Tobacco Use  . Smoking status: Former Smoker    Packs/day: 2.50    Years: 25.00    Pack years: 62.50  Types: Cigarettes    Last attempt to quit: 08/05/1984    Years since quitting: 34.2  . Smokeless tobacco: Never Used  Substance Use Topics  . Alcohol use: Yes    Alcohol/week: 50.0 standard drinks    Types: 50 Standard drinks or equivalent per week    Comment: As of 06/18/2018: Drinks 6 beers, liquor 4-6oz (both daily) x 35 years  . Drug use: Not Currently    Types: Marijuana    Comment: occasionally      Allergies   Patient has no known allergies.   Review of Systems Review of Systems  Constitutional: Positive for diaphoresis. Negative for fever.  HENT: Negative for rhinorrhea and sore throat.   Eyes: Negative for visual disturbance.  Respiratory: Positive for cough, shortness of breath and wheezing.   Cardiovascular: Positive for chest pain.  Gastrointestinal: Negative for abdominal pain, nausea and vomiting.  Genitourinary: Negative for dysuria.  Musculoskeletal: Positive for gait problem.  Skin: Negative for rash.  Neurological: Negative for headaches.     Physical Exam Updated Vital Signs BP (!) 178/84 (BP Location: Right Arm)   Pulse (!) 114   Temp 98.1 F (36.7 C) (Oral)   Resp 20   Ht 6\' 2"  (1.88 m)   Wt 129.3 kg   SpO2 96%   BMI 36.59 kg/m   Physical Exam Vitals signs and nursing note reviewed.  Constitutional:      Appearance: He is well-developed.  HENT:     Head: Normocephalic and atraumatic.  Eyes:      Conjunctiva/sclera: Conjunctivae normal.  Neck:     Musculoskeletal: Neck supple.  Cardiovascular:     Rate and Rhythm: Regular rhythm. Tachycardia present.     Pulses: Normal pulses.     Heart sounds: No murmur.  Pulmonary:     Effort: Pulmonary effort is normal. No respiratory distress.     Breath sounds: Wheezing present.  Abdominal:     Palpations: Abdomen is soft.     Tenderness: There is no abdominal tenderness.  Musculoskeletal:        General: Tenderness and signs of injury present.     Comments: He has some old bruising to his left hand on the dorsum and some tenderness over his fourth and fifth metacarpals.  Full range of motion of all his digits.  He has some chronic knee pain and is in braces.  There is trace edema in his lower extremities.  No cords.  He is chronic lack of use of his left fourth and fifth digits due to a nerve injury in the past.  Skin:    General: Skin is warm and dry.     Capillary Refill: Capillary refill takes less than 2 seconds.  Neurological:     Mental Status: He is alert and oriented to person, place, and time.     Gait: Gait abnormal (uses cane, slow antalgic gait).  Psychiatric:        Mood and Affect: Mood normal.      ED Treatments / Results  Labs (all labs ordered are listed, but only abnormal results are displayed) Labs Reviewed  BASIC METABOLIC PANEL - Abnormal; Notable for the following components:      Result Value   Sodium 134 (*)    Glucose, Bld 381 (*)    All other components within normal limits  CBC WITH DIFFERENTIAL/PLATELET - Abnormal; Notable for the following components:   RBC 3.98 (*)    Hemoglobin 12.9 (*)  MCV 100.8 (*)    Eosinophils Absolute 1.0 (*)    All other components within normal limits  TROPONIN I  BRAIN NATRIURETIC PEPTIDE    EKG EKG Interpretation  Date/Time:  Wednesday October 31 2018 12:38:02 EST Ventricular Rate:  105 PR Interval:    QRS Duration: 98 QT Interval:  333 QTC  Calculation: 441 R Axis:   -85 Text Interpretation:  Sinus tachycardia Inferior infarct, old Consider anterior infarct compared with prior 7/19, new anterior q waves Confirmed by Aletta Edouard [phone]) on 10/31/2018 12:52:13 PM   Radiology Dg Chest 2 View  Result Date: 10/31/2018 CLINICAL DATA:  Left hand swelling. EXAM: CHEST - 2 VIEW COMPARISON:  10/10/2018 FINDINGS: Two views of the chest were obtained. The lungs are clear. Heart size is upper limits of normal but stable. Trachea is midline. No pleural effusions. Bone structures are unremarkable. IMPRESSION: No active cardiopulmonary disease. Electronically Signed   By: Markus Daft M.D.   On: 10/31/2018 13:43   Dg Hand Complete Left  Result Date: 10/31/2018 CLINICAL DATA:  Left hand swelling with no injury. EXAM: LEFT HAND - COMPLETE 3+ VIEW COMPARISON:  None. FINDINGS: There is no evidence of fracture or dislocation. Mild degenerative joint changes with mild subluxation of the first metacarpal carpal joint is noted. Soft tissues are unremarkable. IMPRESSION: No acute abnormality noted. Electronically Signed   By: Abelardo Diesel M.D.   On: 10/31/2018 13:40    Procedures Procedures (including critical care time)  Medications Ordered in ED Medications  ipratropium-albuterol (DUONEB) 0.5-2.5 (3) MG/3ML nebulizer solution 3 mL (3 mLs Nebulization Given 10/31/18 1307)  albuterol (PROVENTIL,VENTOLIN) solution continuous neb (5 mg/hr Nebulization Given 10/31/18 1412)  magnesium sulfate IVPB 2 g 50 mL ( Intravenous Stopped 10/31/18 1507)  predniSONE (DELTASONE) tablet 60 mg (60 mg Oral Given 10/31/18 1403)  azithromycin (ZITHROMAX) tablet 500 mg (500 mg Oral Given 10/31/18 1423)     Initial Impression / Assessment and Plan / ED Course  I have reviewed the triage vital signs and the nursing notes.  Pertinent labs & imaging results that were available during my care of the patient were reviewed by me and considered in my medical decision making (see chart  for details).  Clinical Course as of Nov 01 902  Wed Oct 31, 2018  1352 Patient's symptoms are a little improved.  Fortunately his BNP and his troponin negative.  Chest x-ray showed no acute disease.  No elevated white count.  Sugars elevated at 381 but he says that is about where his baseline is.  He probably needs to be on some steroids but I am afraid it cannot drive her sugars crazy.  We will cover him with an antibiotic and some more breathing treatments and a single dose of some steroid here.   [MB]  7564 Patient states he feels better and would like to be discharged.  I will send him out with a prescription for antibiotics and make sure he has an inhaler.  He understands to follow-up with his primary doctor and return if any worsening symptoms.   [MB]    Clinical Course User Index [MB] Hayden Rasmussen, MD      Final Clinical Impressions(s) / ED Diagnoses   Final diagnoses:  Acute bronchitis, unspecified organism  COPD exacerbation [facility])    ED Discharge Orders         Ordered    albuterol (PROVENTIL HFA;VENTOLIN HFA) 108 (90 Base) MCG/ACT inhaler  Every 6 hours PRN  10/31/18 1524    azithromycin (ZITHROMAX) 250 MG tablet  Daily     10/31/18 1524           Hayden Rasmussen, MD 11/01/18 [phone]

## 2018-11-01 ENCOUNTER — Encounter: Payer: Self-pay | Admitting: Podiatry

## 2018-11-01 ENCOUNTER — Ambulatory Visit: Payer: Medicaid Other | Admitting: Podiatry

## 2018-11-01 DIAGNOSIS — L97421 Non-pressure chronic ulcer of left heel and midfoot limited to breakdown of skin: Secondary | ICD-10-CM | POA: Diagnosis not present

## 2018-11-01 DIAGNOSIS — E11621 Type 2 diabetes mellitus with foot ulcer: Secondary | ICD-10-CM

## 2018-11-01 NOTE — Progress Notes (Signed)
Subjective:  Patient ID: Eugene Diaz, male    DOB: 1966-06-08,  MRN: [id number]  Chief Complaint  Patient presents with  . Wound Check    Follow-up; Left foot; heel-medial side; pt stated, "My foot feels better"; pt Diabetic Type 2; Sugar=did not check today; A1C=does not know-not in chart    53 y.o. male presents with the above complaint.  History as above confirmed with patient  Review of Systems: Negative except as noted in the HPI. Denies N/V/F/Ch.  Past Medical History:  Diagnosis Date  . Anxiety   . Arthritis   . Asthma   . Depression   . Diabetes mellitus without complication ([place]) 05/9210  . Hypercholesterolemia   . Hypertension   . Neuropathy   . Peripheral vascular disease ([place])     Current Outpatient Medications:  .  albuterol (PROVENTIL HFA;VENTOLIN HFA) 108 (90 Base) MCG/ACT inhaler, Inhale 2 puffs into the lungs every 6 (six) hours as needed for wheezing or shortness of breath., Disp: 1 Inhaler, Rfl: 2 .  ALPRAZolam (XANAX) 1 MG tablet, Take 1 mg by mouth 4 (four) times daily. , Disp: , Rfl: 4 .  amitriptyline (ELAVIL) 100 MG tablet, Take 100 mg by mouth at bedtime., Disp: , Rfl:  .  atorvastatin (LIPITOR) 40 MG tablet, Take 40 mg by mouth every evening. , Disp: , Rfl: 10 .  azithromycin (ZITHROMAX) 250 MG tablet, Take 1 tablet (250 mg total) by mouth daily., Disp: 4 tablet, Rfl: 0 .  canagliflozin (INVOKANA) 300 MG TABS tablet, 300 mg daily before breakfast. , Disp: , Rfl:  .  docusate sodium (COLACE) 100 MG capsule, Take 100 mg by mouth daily as needed for mild constipation., Disp: , Rfl:  .  DULoxetine (CYMBALTA) 60 MG capsule, Take 60 mg by mouth daily. , Disp: , Rfl: 10 .  furosemide (LASIX) 20 MG tablet, Take 20 mg by mouth daily as needed for fluid. , Disp: , Rfl:  .  gabapentin (NEURONTIN) 300 MG capsule, Take 900mg  in the morning and at noon, then take 1200mg  at bedtime, Disp: 300 capsule, Rfl: 11 .  glimepiride (AMARYL) 2 MG tablet, Take 2 mg by mouth  daily with breakfast., Disp: , Rfl:  .  glycopyrrolate (ROBINUL) 1 MG tablet, Take 1 mg by mouth 2 (two) times daily. , Disp: , Rfl: 2 .  lidocaine (XYLOCAINE) 5 % ointment, Apply 1 application topically 2 (two) times daily as needed., Disp: 100 g, Rfl: 5 .  lisinopril (PRINIVIL,ZESTRIL) 20 MG tablet, Take 20 mg by mouth daily. , Disp: , Rfl: 4 .  omeprazole (PRILOSEC) 20 MG capsule, 1 PO 30 mins prior to breakfast and supper (Patient taking differently: Take 20 mg by mouth 2 (two) times daily before a meal. 1 PO 30 mins prior to breakfast and supper), Disp: 60 capsule, Rfl: 11 .  oxyCODONE HCl 30 MG TABA, Take 30 mg by mouth every 4 (four) hours as needed (for pain). , Disp: , Rfl: 0 .  primidone (MYSOLINE) 50 MG tablet, Take 1 tablet (50 mg total) by mouth 2 (two) times daily., Disp: 60 tablet, Rfl: 11 .  silver sulfADIAZINE (SILVADENE) 1 % cream, Apply pea-sized amount to wound daily., Disp: 50 g, Rfl: 2 .  VOLTAREN 1 % GEL, Apply 2 g topically 4 (four) times daily. , Disp: , Rfl:  .  zolpidem (AMBIEN) 10 MG tablet, Take 10 mg by mouth at bedtime. , Disp: , Rfl: 3  Social History   Tobacco Use  Smoking Status Former Smoker  . Packs/day: 2.50  . Years: 25.00  . Pack years: 62.50  . Types: Cigarettes  . Last attempt to quit: 08/05/1984  . Years since quitting: 34.2  Smokeless Tobacco Never Used    No Known Allergies Objective:   There were no vitals filed for this visit. There is no height or weight on file to calculate BMI. Constitutional Well developed. Well nourished.  Vascular Dorsalis pedis pulses palpable bilaterally. Posterior tibial pulses palpable bilaterally. Capillary refill normal to all digits.  No cyanosis or clubbing noted. Pedal hair growth normal.  Neurologic Normal speech. Oriented to person, place, and time. Epicritic sensation to light touch grossly present bilaterally.  Dermatologic Nails well groomed and normal in appearance. No skin lesions. L heel  ulcer with slight erythema, warmth, serosanguinous drainage. Fibrotic wound base. 8/16: 4.5.x5 post-debridement. 8/28: 1x 2.5 post-debridement 9/12: 0.5x1.5 post-debridement. 9/25: 6x2.5 post-debridement. 10/3: 4x2.5 post-debridement. 10/14: 2x1.5 post-debridement. 11/7: 3x3 post-debridement 11/14: 2x2 post-debridement 12/5: 1x2 post-debridmeent  Orthopedic: Normal joint ROM without pain or crepitus bilaterally. No visible deformities. No bony tenderness.   Radiographs:  9/25: X-rays taken reviewed.  No underlying osseous erosion.  Assessment:   1. 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  L Heel Ulcer -Wound healed discussed signs of recurrence no need for any ointment or dressing today. -Follow-up should wound recur  No follow-ups on file.

## 2018-12-11 ENCOUNTER — Other Ambulatory Visit: Payer: Self-pay | Admitting: Neurology

## 2018-12-11 DIAGNOSIS — G609 Hereditary and idiopathic neuropathy, unspecified: Secondary | ICD-10-CM

## 2018-12-19 ENCOUNTER — Ambulatory Visit (INDEPENDENT_AMBULATORY_CARE_PROVIDER_SITE_OTHER): Payer: Medicaid Other | Admitting: Nurse Practitioner

## 2018-12-19 ENCOUNTER — Encounter: Payer: Self-pay | Admitting: Nurse Practitioner

## 2018-12-19 VITALS — BP 161/91 | HR 101 | Temp 97.5°F | Ht 74.0 in | Wt 295.0 lb

## 2018-12-19 DIAGNOSIS — K760 Fatty (change of) liver, not elsewhere classified: Secondary | ICD-10-CM

## 2018-12-19 DIAGNOSIS — R1310 Dysphagia, unspecified: Secondary | ICD-10-CM

## 2018-12-19 DIAGNOSIS — K219 Gastro-esophageal reflux disease without esophagitis: Secondary | ICD-10-CM | POA: Insufficient documentation

## 2018-12-19 NOTE — Assessment & Plan Note (Signed)
Symptoms currently resolved on omeprazole.  Recommend he continue his PPI.  Follow-up in 1 year.

## 2018-12-19 NOTE — Assessment & Plan Note (Signed)
History of hepatic steatosis.  He drinks a lot of alcohol, averages 6 beers and 4 to 6 ounces of liquor every day.  I have encouraged him to cut back on his alcohol consumption.  We discussed the amount that is considered "moderate intake" which, for men, is generally 2-3 drinks a day.  He admits he knows he needs to cut back sounds and will work on this.  We discussed ramifications of inappropriate ongoing alcohol intake including but not limited to cirrhosis and sequela related to this.  Follow-up in 1 year.

## 2018-12-19 NOTE — Assessment & Plan Note (Signed)
Status post esophageal dilation and omeprazole.  No further dysphasia symptoms since this.  Follow-up in 1 year.

## 2018-12-19 NOTE — Patient Instructions (Signed)
Your health issues we discussed today were:   GERD (heartburn): 1. I am glad you are not having any heartburn 2. Continue taking omeprazole (Prilosec) 3. Call us if you have any worsening symptoms  Swallowing issues (dysphagia): 1. Call us if you have any further swallowing difficulties 2. Continue taking omeprazole (Prilosec) to help prevent recurrent swallowing problems.  Fatty liver disease: 1. I recommend you cut back on alcohol to no more than moderate intake, which is generally defined as 2-3 drinks a day.  1 beer or 1 ounce of liquor is considered 1 drink  Overall I recommend:  1. Follow-up in 1 year 2. Call us if you have any questions or concerns.  At [facility] we value your feedback. You may receive a survey about your visit today. Please share your experience as we strive to create trusting relationships with our patients to provide genuine, compassionate, quality care.  We appreciate your understanding and patience as we review any laboratory studies, imaging, and other diagnostic tests that are ordered as we care for you. Our office policy is 5 business days for review of these results, and any emergent or urgent results are addressed in a timely manner for your best interest. If you do not hear from our office in 1 week, please contact us.   We also encourage the use of MyChart, which contains your medical information for your review as well. If you are not enrolled in this feature, an access code is on this after visit summary for your convenience. Thank you for allowing [place] to be involved in your care.  It was great to see you today!  I hope you have a great day!!

## 2018-12-19 NOTE — Progress Notes (Signed)
Referring Provider: Redmond School, MD Primary Care Physician:  Redmond School, MD Primary GI:  Dr. Oneida Alar  Chief Complaint  Patient presents with  . Gastroesophageal Reflux    doing ok    HPI:   Eugene Diaz is a 53 y.o. male who presents for follow-up on GERD and dysphasia.  The patient was last seen in our office 06/18/2018 for the same as well as hepatic steatosis.  History of alcohol abuse.  Colonoscopy up-to-date 02/13/2018 and recommended repeat exam 5 to 10 years.  EGD completed also 02/13/2018 with benign-appearing esophageal stricture status post dilation.  He was started on Prilosec after that.  Abdominal ultrasound completed in April 2019 due to thrombocytopenia and alcohol abuse found consistent with hepatic steatosis and moderate splenomegaly.  At his last visit he noted GERD improved with rare breakthrough, no further dysphasia.  Recommended continue current medications, decrease alcohol consumption and eventually avoid/stop all alcohol, follow-up in 6 months.  Today he states he's doing well. GERD much improved on omeprazole, no breakthrough. Denies any further dysphagia. Denies abdominal pain, N/V, hematochezia, melena. Denies chest pain, dyspnea, dizziness, lightheadedness, syncope, near syncope. Denies any other upper or lower GI symptoms.  Past Medical History:  Diagnosis Date  . Anxiety   . Arthritis   . Asthma   . Depression   . Diabetes mellitus without complication ([place]) 12/6832  . Hypercholesterolemia   . Hypertension   . Neuropathy   . Peripheral vascular disease [facility])     Past Surgical History:  Procedure Laterality Date  . BIOPSY  02/13/2018   Procedure: BIOPSY;  Surgeon: Danie Binder, MD;  Location: [facility];  Service: Endoscopy;;  transverse colon biopsy, gastric biopsy  . COLONOSCOPY WITH PROPOFOL N/A 02/13/2018   Procedure: COLONOSCOPY WITH PROPOFOL;  Surgeon: Danie Binder, MD;  Location: [facility];  Service: Endoscopy;  Laterality:  N/A;  11:15am  . ESOPHAGOGASTRODUODENOSCOPY (EGD) WITH PROPOFOL N/A 02/13/2018   Procedure: ESOPHAGOGASTRODUODENOSCOPY (EGD) WITH PROPOFOL;  Surgeon: Danie Binder, MD;  Location: [facility];  Service: Endoscopy;  Laterality: N/A;  . Left arm     Left arm repair (tendon and artery)  . PILONIDAL CYST EXCISION N/A 08/08/2014   Procedure: CYST EXCISION PILONIDAL EXTENSIVE;  Surgeon: Jamesetta So, MD;  Location: [facility];  Service: General;  Laterality: N/A;  . POLYPECTOMY  02/13/2018   Procedure: POLYPECTOMY;  Surgeon: Danie Binder, MD;  [facility]: [facility];  Service: Endoscopy;;  transverse colon polyp hs, rectal polyps times 2  . SAVORY DILATION N/A 02/13/2018   Procedure: SAVORY DILATION;  Surgeon: Danie Binder, MD;  [facility]: [facility];  Service: Endoscopy;  Laterality: N/A;    Current Outpatient Medications  Medication Sig Dispense Refill  . albuterol (PROVENTIL HFA;VENTOLIN HFA) 108 (90 Base) MCG/ACT inhaler Inhale 2 puffs into the lungs every 6 (six) hours as needed for wheezing or shortness of breath. 1 Inhaler 2  . amitriptyline (ELAVIL) 100 MG tablet Take 100 mg by mouth at bedtime.    Marland Kitchen atorvastatin (LIPITOR) 40 MG tablet Take 40 mg by mouth every evening.   10  . canagliflozin (INVOKANA) 300 MG TABS tablet 300 mg daily before breakfast.     . docusate sodium (COLACE) 100 MG capsule Take 100 mg by mouth daily as needed for mild constipation.    . DULoxetine (CYMBALTA) 60 MG capsule Take 60 mg by mouth daily.   10  . furosemide (LASIX) 20 MG tablet  Take 20 mg by mouth daily as needed for fluid.     Marland Kitchen gabapentin (NEURONTIN) 300 MG capsule Take 900mg  in the morning and at noon, then take 1200mg  at bedtime 300 capsule 11  . glimepiride (AMARYL) 2 MG tablet Take 2 mg by mouth daily with breakfast.    . glycopyrrolate (ROBINUL) 1 MG tablet Take 1 mg by mouth 2 (two) times daily.   2  . lidocaine (XYLOCAINE) 5 % ointment APPLY TO AFFECTED AREAS TWICE DAILY AS NEEDED. 70.88 g  1  . lisinopril (PRINIVIL,ZESTRIL) 20 MG tablet Take 20 mg by mouth daily.   4  . omeprazole (PRILOSEC) 20 MG capsule 1 PO 30 mins prior to breakfast and supper (Patient taking differently: Take 20 mg by mouth 2 (two) times daily before a meal. 1 PO 30 mins prior to breakfast and supper) 60 capsule 11  . oxyCODONE HCl 30 MG TABA Take 30 mg by mouth every 4 (four) hours as needed (for pain).   0  . primidone (MYSOLINE) 50 MG tablet Take 1 tablet (50 mg total) by mouth 2 (two) times daily. 60 tablet 11  . VOLTAREN 1 % GEL Apply 2 g topically 4 (four) times daily.     Marland Kitchen zolpidem (AMBIEN) 10 MG tablet Take 10 mg by mouth at bedtime.   3   No current facility-administered medications for this visit.     Allergies as of 12/19/2018  . (No Known Allergies)    Family History  Problem Relation Age of Onset  . Heart attack Mother        Living, 44  . Cancer Father        Deceased  . Healthy Brother   . Healthy Sister   . Colon cancer Neg Hx   . Colon polyps Neg Hx     Social History   Socioeconomic History  . Marital status: Married    Spouse name: Not on file  . Number of children: Not on file  . Years of education: Not on file  . Highest education level: Not on file  Occupational History  . 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  Social Needs  . Financial resource strain: Not on file  . Food insecurity:    Worry: Not on file    Inability: Not on file  . Transportation needs:    Medical: Not on file    Non-medical: Not on file  Tobacco Use  . Smoking status: Former Smoker    Packs/day: 2.50    Years: 25.00    Pack years: 62.50    Types: Cigarettes    Last attempt to quit: 08/05/1984    Years since quitting: 34.3  . Smokeless tobacco: Never Used  Substance and Sexual Activity  . Alcohol use: Yes    Alcohol/week: 50.0 standard drinks    Types: 50 Standard drinks or equivalent per week    Comment: Drinks 6 beers, liquor 4-6oz (both  daily) x 35 years  . Drug use: Yes    Types: Marijuana    Comment: occasionally   . Sexual activity: Yes  Lifestyle  . Physical activity:    Days per week: Not on file    Minutes per session: Not on file  . Stress: Not on file  Relationships  . Social connections:    Talks on phone: Not on file    Gets together: Not on file    Attends religious service: Not on file  Active member of club or organization: Not on file    Attends meetings of clubs or organizations: Not on file    Relationship status: Not on file  Other Topics Concern  . 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 Highest level of education:  8th grade   He lives with wife.  No children.    Review of Systems: Complete ROS negative except as per HPI.   Physical Exam: BP (!) 161/91   Pulse (!) 101   Temp (!) 97.5 F (36.4 C) (Oral)   Ht 6\' 2"  (1.88 m)   Wt 295 lb (133.8 kg)   BMI 37.88 kg/m  General:   Alert and oriented. Pleasant and cooperative. Well-nourished and well-developed.  Eyes:  Without icterus, sclera clear and conjunctiva pink.  Ears:  Normal auditory acuity. Cardiovascular:  S1, S2 present without murmurs appreciated. Extremities without clubbing or edema. Respiratory:  Clear to auscultation bilaterally. No wheezes, rales, or rhonchi. No distress.  Gastrointestinal:  +BS, soft, non-tender and non-distended. No HSM noted. No guarding or rebound. No masses appreciated.  Rectal:  Deferred  Musculoskalatal:  Symmetrical without gross deformities. Skin:  Intact without significant lesions or rashes. Neurologic:  Alert and oriented x4;  grossly normal neurologically. Psych:  Alert and cooperative. Normal mood and affect. Heme/Lymph/Immune: No excessive bruising noted.    12/19/2018 1:45 PM   Disclaimer: This note was dictated with voice recognition software. Similar sounding words can inadvertently be transcribed and  may not be corrected upon review.

## 2018-12-20 NOTE — Progress Notes (Signed)
cc'ed to pcp °

## 2018-12-27 ENCOUNTER — Encounter: Payer: Self-pay | Admitting: Podiatry

## 2018-12-27 ENCOUNTER — Ambulatory Visit: Payer: Medicaid Other | Admitting: Podiatry

## 2018-12-27 DIAGNOSIS — L853 Xerosis cutis: Secondary | ICD-10-CM | POA: Diagnosis not present

## 2018-12-27 DIAGNOSIS — R234 Changes in skin texture: Secondary | ICD-10-CM

## 2018-12-27 NOTE — Progress Notes (Signed)
Subjective:  Patient ID: Eugene Diaz, male    DOB: 06-Apr-1966,  MRN: [id number]  Chief Complaint  Patient presents with  . Foot Pain    Sub 1st MPJ right - callused area, cracked, afraid its going to snag on the floor and rip skin, tried medicated lotions    53 y.o. male presents with the above complaint.   Review of Systems: Negative except as noted in the HPI. Denies N/V/F/Ch.  Past Medical History:  Diagnosis Date  . Anxiety   . Arthritis   . Asthma   . Depression   . Diabetes mellitus without complication ([place]) 0/9628  . Hypercholesterolemia   . Hypertension   . Neuropathy   . Peripheral vascular disease ([place])     Current Outpatient Medications:  .  albuterol (PROVENTIL HFA;VENTOLIN HFA) 108 (90 Base) MCG/ACT inhaler, Inhale 2 puffs into the lungs every 6 (six) hours as needed for wheezing or shortness of breath., Disp: 1 Inhaler, Rfl: 2 .  amitriptyline (ELAVIL) 100 MG tablet, Take 100 mg by mouth at bedtime., Disp: , Rfl:  .  atorvastatin (LIPITOR) 40 MG tablet, Take 40 mg by mouth every evening. , Disp: , Rfl: 10 .  canagliflozin (INVOKANA) 300 MG TABS tablet, 300 mg daily before breakfast. , Disp: , Rfl:  .  docusate sodium (COLACE) 100 MG capsule, Take 100 mg by mouth daily as needed for mild constipation., Disp: , Rfl:  .  DULoxetine (CYMBALTA) 60 MG capsule, Take 60 mg by mouth daily. , Disp: , Rfl: 10 .  furosemide (LASIX) 20 MG tablet, Take 20 mg by mouth daily as needed for fluid. , Disp: , Rfl:  .  gabapentin (NEURONTIN) 300 MG capsule, Take 900mg  in the morning and at noon, then take 1200mg  at bedtime, Disp: 300 capsule, Rfl: 11 .  glimepiride (AMARYL) 2 MG tablet, Take 2 mg by mouth daily with breakfast., Disp: , Rfl:  .  glycopyrrolate (ROBINUL) 1 MG tablet, Take 1 mg by mouth 2 (two) times daily. , Disp: , Rfl: 2 .  lidocaine (XYLOCAINE) 5 % ointment, APPLY TO AFFECTED AREAS TWICE DAILY AS NEEDED., Disp: 70.88 g, Rfl: 1 .  lisinopril (PRINIVIL,ZESTRIL) 20  MG tablet, Take 20 mg by mouth daily. , Disp: , Rfl: 4 .  omeprazole (PRILOSEC) 20 MG capsule, 1 PO 30 mins prior to breakfast and supper (Patient taking differently: Take 20 mg by mouth 2 (two) times daily before a meal. 1 PO 30 mins prior to breakfast and supper), Disp: 60 capsule, Rfl: 11 .  oxyCODONE HCl 30 MG TABA, Take 30 mg by mouth every 4 (four) hours as needed (for pain). , Disp: , Rfl: 0 .  primidone (MYSOLINE) 50 MG tablet, Take 1 tablet (50 mg total) by mouth 2 (two) times daily., Disp: 60 tablet, Rfl: 11 .  VOLTAREN 1 % GEL, Apply 2 g topically 4 (four) times daily. , Disp: , Rfl:  .  zolpidem (AMBIEN) 10 MG tablet, Take 10 mg by mouth at bedtime. , Disp: , Rfl: 3  Social History   Tobacco Use  Smoking Status Former Smoker  . Packs/day: 2.50  . Years: 25.00  . Pack years: 62.50  . Types: Cigarettes  . Last attempt to quit: 08/05/1984  . Years since quitting: 34.4  Smokeless Tobacco Never Used    No Known Allergies Objective:  There were no vitals filed for this visit. There is no height or weight on file to calculate BMI. Constitutional Well developed. Well  nourished.  Vascular Dorsalis pedis pulses palpable bilaterally. Posterior tibial pulses palpable bilaterally. Capillary refill normal to all digits.  No cyanosis or clubbing noted. Pedal hair growth normal.  Neurologic Normal speech. Oriented to person, place, and time. Epicritic sensation to light touch grossly present bilaterally.  Dermatologic Nails normal Skin fissured cracked dry skin R 1st MPJ  Orthopedic: Normal joint ROM without pain or crepitus bilaterally. No visible deformities. No bony tenderness.   Radiographs: None Assessment:   1. Fissured skin   2. Dry skin    Plan:  Patient was evaluated and treated and all questions answered.  R 1st MPJ Fissure, Callus  -Debridement as below. -Discussed care with use of urea cream. Dispense at checkout.  Return if symptoms worsen or fail to  improve.

## 2019-02-01 ENCOUNTER — Other Ambulatory Visit: Payer: Self-pay | Admitting: Neurology

## 2019-02-01 DIAGNOSIS — G609 Hereditary and idiopathic neuropathy, unspecified: Secondary | ICD-10-CM

## 2019-03-05 ENCOUNTER — Other Ambulatory Visit: Payer: Self-pay | Admitting: Gastroenterology

## 2019-04-04 ENCOUNTER — Other Ambulatory Visit: Payer: Self-pay | Admitting: Neurology

## 2019-04-04 DIAGNOSIS — G609 Hereditary and idiopathic neuropathy, unspecified: Secondary | ICD-10-CM

## 2019-06-14 ENCOUNTER — Telehealth (INDEPENDENT_AMBULATORY_CARE_PROVIDER_SITE_OTHER): Payer: Medicaid Other | Admitting: Neurology

## 2019-06-14 ENCOUNTER — Encounter: Payer: Self-pay | Admitting: Neurology

## 2019-06-14 ENCOUNTER — Other Ambulatory Visit: Payer: Self-pay

## 2019-06-14 VITALS — Ht 74.0 in | Wt 287.0 lb

## 2019-06-14 DIAGNOSIS — G621 Alcoholic polyneuropathy: Secondary | ICD-10-CM

## 2019-06-14 DIAGNOSIS — E0842 Diabetes mellitus due to underlying condition with diabetic polyneuropathy: Secondary | ICD-10-CM

## 2019-06-14 DIAGNOSIS — G250 Essential tremor: Secondary | ICD-10-CM

## 2019-06-14 MED ORDER — PRIMIDONE 50 MG PO TABS: 50.0000 mg | ORAL_TABLET | Freq: Two times a day (BID) | ORAL | 11 refills | Status: DC

## 2019-06-14 MED ORDER — GABAPENTIN 300 MG PO CAPS: ORAL_CAPSULE | ORAL | 11 refills | Status: DC

## 2019-06-14 NOTE — Progress Notes (Signed)
    Virtual Visit via Telephone Note The purpose of this virtual visit is to provide medical care while limiting exposure to the novel coronavirus.    Consent was obtained for phone visit:  Yes.   Answered questions that patient had about telehealth interaction:  Yes.   I discussed the limitations, risks, security and privacy concerns of performing an evaluation and management service by telephone. I also discussed with the patient that there may be a patient responsible charge related to this service. The patient expressed understanding and agreed to proceed.  Pt location: Home Physician Location: office Name of referring provider:  Redmond School, MD I connected with .Eugene Diaz at patients initiation/request on 06/14/2019 at  3:30 PM EDT by telephone and verified that I am speaking with the correct person using two identifiers.  Pt MRN:  [id number] Pt DOB:  [birth year]   History of Present Illness: This is a 53 year old man returning for follow-up alcohol induced tremors and neuropathy.  Overall, he feels that his neuropathy is stable.  He continues to have burning and stinging sensation in the feet and lower legs, which is fairly well controlled on gabapentin 900 mg in the morning, 900 mg in the afternoon, and 1200 mg at bedtime.  Balance is fair.  He is careful and has not suffered any falls.  Over the last week, he has noticed some increased swelling in the toes and legs.  He admits to being more active on his feet than usual.  His tremors are very well controlled on primidone.  He does not have any new neurological complaints.    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 Continue gabapentin 900mg  in the morning, 900mg  in the afternoon, and 1200mg  at bedtime.    Continue lidocaine ointment to feet.   Alcohol induced tremor, stable.   Continue primidone 50mg  twice daily Again, I encouraged to reduce alcohol which he is not interested in   Follow Up Instructions:   I discussed the assessment and treatment plan with the patient. The patient was provided an opportunity to ask questions and all were answered. The patient agreed with the plan and demonstrated an understanding of the instructions.   The patient was advised to call back or seek an in-person evaluation if the symptoms worsen or if the condition fails to improve as anticipated.  Total Time spent in visit with the patient was:  20 min, of which 100% of the time was spent in counseling and/or coordinating care.   Pt understands and agrees with the plan of care outlined.     Alda Berthold, DO

## 2019-07-12 ENCOUNTER — Inpatient Hospital Stay (HOSPITAL_COMMUNITY)
Admission: AD | Admit: 2019-07-12 | Discharge: 2019-07-18 | DRG: 581 | Disposition: A | Discharge status: Home Health | Payer: Medicaid Other | Source: Ambulatory Visit | Attending: Internal Medicine | Admitting: Internal Medicine

## 2019-07-12 ENCOUNTER — Other Ambulatory Visit: Payer: Self-pay | Admitting: Podiatry

## 2019-07-12 ENCOUNTER — Ambulatory Visit (INDEPENDENT_AMBULATORY_CARE_PROVIDER_SITE_OTHER): Payer: Medicaid Other | Admitting: Podiatry

## 2019-07-12 ENCOUNTER — Other Ambulatory Visit: Payer: Self-pay

## 2019-07-12 ENCOUNTER — Ambulatory Visit (INDEPENDENT_AMBULATORY_CARE_PROVIDER_SITE_OTHER): Payer: Medicaid Other

## 2019-07-12 VITALS — BP 178/106 | HR 109 | Temp 98.1°F

## 2019-07-12 DIAGNOSIS — L03032 Cellulitis of left toe: Secondary | ICD-10-CM

## 2019-07-12 DIAGNOSIS — W2209XA Striking against other stationary object, initial encounter: Secondary | ICD-10-CM | POA: Diagnosis present

## 2019-07-12 DIAGNOSIS — I739 Peripheral vascular disease, unspecified: Secondary | ICD-10-CM | POA: Diagnosis not present

## 2019-07-12 DIAGNOSIS — Z79899 Other long term (current) drug therapy: Secondary | ICD-10-CM

## 2019-07-12 DIAGNOSIS — L02612 Cutaneous abscess of left foot: Secondary | ICD-10-CM

## 2019-07-12 DIAGNOSIS — E11621 Type 2 diabetes mellitus with foot ulcer: Secondary | ICD-10-CM | POA: Diagnosis present

## 2019-07-12 DIAGNOSIS — S92412A Displaced fracture of proximal phalanx of left great toe, initial encounter for closed fracture: Secondary | ICD-10-CM

## 2019-07-12 DIAGNOSIS — S92912A Unspecified fracture of left toe(s), initial encounter for closed fracture: Secondary | ICD-10-CM

## 2019-07-12 DIAGNOSIS — R609 Edema, unspecified: Secondary | ICD-10-CM | POA: Diagnosis not present

## 2019-07-12 DIAGNOSIS — I10 Essential (primary) hypertension: Secondary | ICD-10-CM | POA: Diagnosis present

## 2019-07-12 DIAGNOSIS — F1010 Alcohol abuse, uncomplicated: Secondary | ICD-10-CM | POA: Diagnosis present

## 2019-07-12 DIAGNOSIS — L97529 Non-pressure chronic ulcer of other part of left foot with unspecified severity: Secondary | ICD-10-CM | POA: Diagnosis present

## 2019-07-12 DIAGNOSIS — Z87891 Personal history of nicotine dependence: Secondary | ICD-10-CM | POA: Diagnosis not present

## 2019-07-12 DIAGNOSIS — E11628 Type 2 diabetes mellitus with other skin complications: Secondary | ICD-10-CM | POA: Diagnosis not present

## 2019-07-12 DIAGNOSIS — E1142 Type 2 diabetes mellitus with diabetic polyneuropathy: Secondary | ICD-10-CM | POA: Diagnosis present

## 2019-07-12 DIAGNOSIS — Y9302 Activity, running: Secondary | ICD-10-CM | POA: Diagnosis present

## 2019-07-12 DIAGNOSIS — Z6836 Body mass index (BMI) 36.0-36.9, adult: Secondary | ICD-10-CM

## 2019-07-12 DIAGNOSIS — L0390 Cellulitis, unspecified: Secondary | ICD-10-CM | POA: Diagnosis not present

## 2019-07-12 DIAGNOSIS — Z23 Encounter for immunization: Secondary | ICD-10-CM

## 2019-07-12 DIAGNOSIS — E669 Obesity, unspecified: Secondary | ICD-10-CM | POA: Diagnosis present

## 2019-07-12 DIAGNOSIS — E785 Hyperlipidemia, unspecified: Secondary | ICD-10-CM | POA: Diagnosis present

## 2019-07-12 DIAGNOSIS — Z20828 Contact with and (suspected) exposure to other viral communicable diseases: Secondary | ICD-10-CM | POA: Diagnosis present

## 2019-07-12 DIAGNOSIS — M79672 Pain in left foot: Secondary | ICD-10-CM

## 2019-07-12 DIAGNOSIS — Z791 Long term (current) use of non-steroidal anti-inflammatories (NSAID): Secondary | ICD-10-CM | POA: Diagnosis not present

## 2019-07-12 DIAGNOSIS — K219 Gastro-esophageal reflux disease without esophagitis: Secondary | ICD-10-CM | POA: Diagnosis present

## 2019-07-12 DIAGNOSIS — E1159 Type 2 diabetes mellitus with other circulatory complications: Secondary | ICD-10-CM | POA: Diagnosis present

## 2019-07-12 DIAGNOSIS — S92412P Displaced fracture of proximal phalanx of left great toe, subsequent encounter for fracture with malunion: Secondary | ICD-10-CM | POA: Diagnosis not present

## 2019-07-12 DIAGNOSIS — L03116 Cellulitis of left lower limb: Secondary | ICD-10-CM

## 2019-07-12 DIAGNOSIS — E1151 Type 2 diabetes mellitus with diabetic peripheral angiopathy without gangrene: Secondary | ICD-10-CM | POA: Diagnosis present

## 2019-07-12 DIAGNOSIS — G621 Alcoholic polyneuropathy: Secondary | ICD-10-CM

## 2019-07-12 DIAGNOSIS — J45909 Unspecified asthma, uncomplicated: Secondary | ICD-10-CM | POA: Diagnosis present

## 2019-07-12 DIAGNOSIS — E119 Type 2 diabetes mellitus without complications: Secondary | ICD-10-CM | POA: Diagnosis not present

## 2019-07-12 DIAGNOSIS — Z7984 Long term (current) use of oral hypoglycemic drugs: Secondary | ICD-10-CM

## 2019-07-12 DIAGNOSIS — Y9289 Other specified places as the place of occurrence of the external cause: Secondary | ICD-10-CM

## 2019-07-12 DIAGNOSIS — I152 Hypertension secondary to endocrine disorders: Secondary | ICD-10-CM | POA: Diagnosis present

## 2019-07-12 DIAGNOSIS — E7800 Pure hypercholesterolemia, unspecified: Secondary | ICD-10-CM | POA: Diagnosis present

## 2019-07-12 DIAGNOSIS — L089 Local infection of the skin and subcutaneous tissue, unspecified: Secondary | ICD-10-CM | POA: Diagnosis not present

## 2019-07-12 DIAGNOSIS — Z01818 Encounter for other preprocedural examination: Secondary | ICD-10-CM

## 2019-07-12 MED ORDER — THIAMINE HCL 100 MG/ML IJ SOLN
100.0000 mg | Freq: Every day | INTRAMUSCULAR | Status: DC
  Filled 2019-07-12: qty 2

## 2019-07-12 MED ORDER — FOLIC ACID 1 MG PO TABS
1.0000 mg | ORAL_TABLET | Freq: Every day | ORAL | Status: DC
  Administered 2019-07-13 – 2019-07-18 (×6): 1 mg via ORAL
  Filled 2019-07-12 (×5): qty 1

## 2019-07-12 MED ORDER — ACETAMINOPHEN 325 MG PO TABS
650.0000 mg | ORAL_TABLET | Freq: Four times a day (QID) | ORAL | Status: DC | PRN
  Administered 2019-07-13 – 2019-07-17 (×5): 650 mg via ORAL
  Filled 2019-07-12 (×5): qty 2

## 2019-07-12 MED ORDER — VITAMIN B-1 100 MG PO TABS
100.0000 mg | ORAL_TABLET | Freq: Every day | ORAL | Status: DC
  Administered 2019-07-13 – 2019-07-17 (×5): 100 mg via ORAL
  Filled 2019-07-12 (×6): qty 1

## 2019-07-12 MED ORDER — ADULT MULTIVITAMIN W/MINERALS CH
1.0000 | ORAL_TABLET | Freq: Every day | ORAL | Status: DC
  Administered 2019-07-13 – 2019-07-18 (×6): 1 via ORAL
  Filled 2019-07-12 (×6): qty 1

## 2019-07-12 MED ORDER — INSULIN ASPART 100 UNIT/ML ~~LOC~~ SOLN
0.0000 [IU] | Freq: Three times a day (TID) | SUBCUTANEOUS | Status: DC
  Administered 2019-07-13: 3 [IU] via SUBCUTANEOUS
  Administered 2019-07-13: 5 [IU] via SUBCUTANEOUS
  Administered 2019-07-13 – 2019-07-14 (×4): 3 [IU] via SUBCUTANEOUS
  Administered 2019-07-15 – 2019-07-16 (×4): 2 [IU] via SUBCUTANEOUS
  Administered 2019-07-16: 1 [IU] via SUBCUTANEOUS
  Administered 2019-07-17: 3 [IU] via SUBCUTANEOUS
  Administered 2019-07-17: 5 [IU] via SUBCUTANEOUS
  Administered 2019-07-17: 1 [IU] via SUBCUTANEOUS

## 2019-07-12 MED ORDER — ACETAMINOPHEN 650 MG RE SUPP: 650.0000 mg | Freq: Four times a day (QID) | RECTAL | Status: DC | PRN

## 2019-07-12 MED ORDER — LORAZEPAM 1 MG PO TABS
1.0000 mg | ORAL_TABLET | ORAL | Status: AC | PRN
  Filled 2019-07-12: qty 2

## 2019-07-12 MED ORDER — LORAZEPAM 2 MG/ML IJ SOLN: 1.0000 mg | INTRAMUSCULAR | Status: AC | PRN

## 2019-07-12 MED ORDER — INSULIN ASPART 100 UNIT/ML ~~LOC~~ SOLN
0.0000 [IU] | Freq: Every day | SUBCUTANEOUS | Status: DC
  Administered 2019-07-13: 3 [IU] via SUBCUTANEOUS
  Administered 2019-07-14: 2 [IU] via SUBCUTANEOUS

## 2019-07-12 NOTE — Progress Notes (Signed)
Subjective:  Patient ID: Eugene Diaz, male    DOB: 09-07-1966,  MRN: [id number]  Chief Complaint  Patient presents with  . Foot Injury    Pt states hit left 1st digit on a railroad track around the date of august 26th 2020. Pt states since then the toe and surrounding area has become red, and spread up his left leg. Pt denies fever/nausea/vomiting/chills.    53 y.o. male presents for wound care. Concerned about appearance of left great toe and left leg.   Review of Systems: Negative except as noted in the HPI. Denies N/V/F/Ch.  Past Medical History:  Diagnosis Date  . Anxiety   . Arthritis   . Asthma   . Depression   . Diabetes mellitus without complication ([place]) 0000000  . Hypercholesterolemia   . Hypertension   . Neuropathy   . Peripheral vascular disease ([place])     Current Outpatient Medications:  .  albuterol (PROVENTIL HFA;VENTOLIN HFA) 108 (90 Base) MCG/ACT inhaler, Inhale 2 puffs into the lungs every 6 (six) hours as needed for wheezing or shortness of breath., Disp: 1 Inhaler, Rfl: 2 .  amitriptyline (ELAVIL) 100 MG tablet, Take 100 mg by mouth at bedtime., Disp: , Rfl:  .  atorvastatin (LIPITOR) 40 MG tablet, Take 40 mg by mouth every evening. , Disp: , Rfl: 10 .  canagliflozin (INVOKANA) 300 MG TABS tablet, 300 mg daily before breakfast. , Disp: , Rfl:  .  docusate sodium (COLACE) 100 MG capsule, Take 100 mg by mouth daily as needed for mild constipation., Disp: , Rfl:  .  DULoxetine (CYMBALTA) 60 MG capsule, Take 60 mg by mouth daily. , Disp: , Rfl: 10 .  furosemide (LASIX) 20 MG tablet, Take 20 mg by mouth daily as needed for fluid. , Disp: , Rfl:  .  gabapentin (NEURONTIN) 300 MG capsule, Take 900mg  in the morning and at noon, then take 1200mg  at bedtime, Disp: 300 capsule, Rfl: 11 .  glimepiride (AMARYL) 2 MG tablet, Take 2 mg by mouth daily with breakfast., Disp: , Rfl:  .  glycopyrrolate (ROBINUL) 1 MG tablet, Take 1 mg by mouth 2 (two) times daily. , Disp: ,  Rfl: 2 .  lidocaine (XYLOCAINE) 5 % ointment, APPLY TO AFFECTED AREAS TWICE DAILY AS NEEDED., Disp: 70.88 g, Rfl: 3 .  lisinopril (PRINIVIL,ZESTRIL) 20 MG tablet, Take 20 mg by mouth daily. , Disp: , Rfl: 4 .  omeprazole (PRILOSEC) 20 MG capsule, Take 1 capsule (20 mg total) by mouth 2 (two) times daily before a meal. 1 PO 30 mins prior to breakfast and supper, Disp: 60 capsule, Rfl: 5 .  oxyCODONE HCl 30 MG TABA, Take 30 mg by mouth every 4 (four) hours as needed (for pain). , Disp: , Rfl: 0 .  primidone (MYSOLINE) 50 MG tablet, Take 1 tablet (50 mg total) by mouth 2 (two) times daily., Disp: 60 tablet, Rfl: 11 .  VOLTAREN 1 % GEL, Apply 2 g topically 4 (four) times daily. , Disp: , Rfl:   Social History   Tobacco Use  Smoking Status Former Smoker  . Packs/day: 2.50  . Years: 25.00  . Pack years: 62.50  . Types: Cigarettes  . Quit date: 08/05/1984  . Years since quitting: 34.9  Smokeless Tobacco Never Used    No Known Allergies Objective:   Vitals:   07/12/19 1628  BP: (!) 178/106  Pulse: (!) 109  Temp: 98.1 F (36.7 C)   There is no height or weight  on file to calculate BMI. Constitutional Well developed. Well nourished.  Vascular Dorsalis pedis pulses diminished left. Posterior tibial pulses non-palpable left Pedal hair growth absent.  Neurologic Normal speech. Oriented to person, place, and time. Protective sensation absent  Dermatologic Extensive left leg cellulitis Left great toe cellulitis, pallor, cool to touch, blistering present to the left great toe.   Orthopedic: No pain to palpation either foot.   Radiographs: Displaced fracture of the proximal phalanx. No bone erosion noted. Assessment:   1. Closed displaced fracture of proximal phalanx of left great toe, initial encounter   2. Cellulitis and abscess of toe of left foot   3. Cellulitis of left leg   4. Alcoholic peripheral neuropathy ([place])   5. DM type 2 with diabetic peripheral neuropathy ([place])     Plan:  Patient was evaluated and treated and all questions answered.  Left great toe fracture, pallor, cellulitis, left leg cellulitis -XR as above -Would benefit from admission for IV abx due to cellulitis -Would benefit from ABIs due to decreased pulses -Will evaluate for possible surgery after ABIs and improvement with abx -Will follow up at the hospital  35 minutes of non-face to face time were spent co-ordinating direct admission to the hospital for the above issues. Dr. Cathlean Sauer agreed to admit. Called bedboard for admission. No beds available patient will go home pack a bag and be called when bed ready.   No follow-ups on file.

## 2019-07-12 NOTE — H&P (Signed)
History and Physical    Eugene Diaz [id number] DOB: 08/20/1966 DOA: 07/12/2019  PCP: Redmond School, MD  Chief Complaint: Left great toe injury  HPI: Eugene Diaz is a 53 y.o. male with medical history significant of anxiety, arthritis, asthma, depression, type 2 diabetes, hypertension, hyperlipidemia, neuropathy, peripheral vascular disease presenting to the hospital as a direct admit from podiatry for left great toe fracture and left lower extremity cellulitis.  Patient states about a week ago he was playing with his grandkids and while running bumped his left great toe into a railroad track.  Since then he has been having a lot of pain and swelling in this area.  There is pus draining from his toe.  The swelling and redness has been spreading up and has gone up all the way up to his leg.  He is not having any fevers or chills.  States he is not having pain as he has nerve problems from his diabetes.  He takes a pill for his diabetes but not insulin.  No other complaints.  Review of Systems:  All systems reviewed and apart from history of presenting illness, are negative.  Past Medical History:  Diagnosis Date  . Anxiety   . Arthritis   . Asthma   . Depression   . Diabetes mellitus without complication ([place]) 0000000  . Hypercholesterolemia   . Hypertension   . Neuropathy   . Peripheral vascular disease [facility])     Past Surgical History:  Procedure Laterality Date  . BIOPSY  02/13/2018   Procedure: BIOPSY;  Surgeon: Danie Binder, MD;  Location: [facility];  Service: Endoscopy;;  transverse colon biopsy, gastric biopsy  . COLONOSCOPY WITH PROPOFOL N/A 02/13/2018   Procedure: COLONOSCOPY WITH PROPOFOL;  Surgeon: Danie Binder, MD;  Location: [facility];  Service: Endoscopy;  Laterality: N/A;  11:15am  . ESOPHAGOGASTRODUODENOSCOPY (EGD) WITH PROPOFOL N/A 02/13/2018   Procedure: ESOPHAGOGASTRODUODENOSCOPY (EGD) WITH PROPOFOL;  Surgeon: Danie Binder, MD;  Location: [facility];  Service: Endoscopy;  Laterality: N/A;  . Left arm     Left arm repair (tendon and artery)  . PILONIDAL CYST EXCISION N/A 08/08/2014   Procedure: CYST EXCISION PILONIDAL EXTENSIVE;  Surgeon: Jamesetta So, MD;  Location: [facility];  Service: General;  Laterality: N/A;  . POLYPECTOMY  02/13/2018   Procedure: POLYPECTOMY;  Surgeon: Danie Binder, MD;  [facility];  Service: Endoscopy;;  transverse colon polyp hs, rectal polyps times 2  . SAVORY DILATION N/A 02/13/2018   Procedure: SAVORY DILATION;  Surgeon: Danie Binder, MD;  Location: [facility];  Service: Endoscopy;  Laterality: N/A;     reports that he quit smoking about 34 years ago. His smoking use included cigarettes. He has a 62.50 pack-year smoking history. He has never used smokeless tobacco. He reports current alcohol use of about 50.0 standard drinks of alcohol per week. He reports current drug use. Drug: Marijuana.  No Known Allergies  Family History  Problem Relation Age of Onset  . Heart attack Mother        Living, 6  . Cancer Father        Deceased  . Healthy Brother   . Healthy Sister   . Colon cancer Neg Hx   . Colon polyps Neg Hx     Prior to Admission medications   Medication Sig Start Date End Date Taking? Authorizing Provider  albuterol (PROVENTIL HFA;VENTOLIN HFA) 108 (90 Base) MCG/ACT inhaler  Inhale 2 puffs into the lungs every 6 (six) hours as needed for wheezing or shortness of breath. 10/31/18   Hayden Rasmussen, MD  amitriptyline (ELAVIL) 100 MG tablet Take 100 mg by mouth at bedtime.    [provider]  atorvastatin (LIPITOR) 40 MG tablet Take 40 mg by mouth every evening.  12/17/15   [provider]  canagliflozin (INVOKANA) 300 MG TABS tablet 300 mg daily before breakfast.  11/16/15   [provider]  docusate sodium (COLACE) 100 MG capsule Take 100 mg by mouth daily as needed for mild constipation.    [provider]  DULoxetine (CYMBALTA) 60  MG capsule Take 60 mg by mouth daily.  12/11/15   [provider]  furosemide (LASIX) 20 MG tablet Take 20 mg by mouth daily as needed for fluid.     [provider]  gabapentin (NEURONTIN) 300 MG capsule Take 900mg  in the morning and at noon, then take 1200mg  at bedtime 06/14/19   Patel, Donika K, DO  glimepiride (AMARYL) 2 MG tablet Take 2 mg by mouth daily with breakfast.    [provider]  glycopyrrolate (ROBINUL) 1 MG tablet Take 1 mg by mouth 2 (two) times daily.  12/17/15   [provider]  lidocaine (XYLOCAINE) 5 % ointment APPLY TO AFFECTED AREAS TWICE DAILY AS NEEDED. 04/04/19   Patel, Donika K, DO  lisinopril (PRINIVIL,ZESTRIL) 20 MG tablet Take 20 mg by mouth daily.  12/30/15   [provider]  omeprazole (PRILOSEC) 20 MG capsule Take 1 capsule (20 mg total) by mouth 2 (two) times daily before a meal. 1 PO 30 mins prior to breakfast and supper 03/05/19   Mahala Menghini, PA-C  oxyCODONE HCl 30 MG TABA Take 30 mg by mouth every 4 (four) hours as needed (for pain).  12/07/15   [provider]  primidone (MYSOLINE) 50 MG tablet Take 1 tablet (50 mg total) by mouth 2 (two) times daily. 06/14/19   Patel, Donika K, DO  VOLTAREN 1 % GEL Apply 2 g topically 4 (four) times daily.  08/19/15   [provider]    Physical Exam: Vitals:   07/12/19 2128  BP: 138/82  Pulse: (!) 109  Resp: 19  Temp: 98.9 F (37.2 C)  TempSrc: Oral  SpO2: 96%    Physical Exam  Constitutional: He is oriented to person, place, and time. He appears well-developed and well-nourished. No distress.  HENT:  Head: Normocephalic.  Mouth/Throat: Oropharynx is clear and moist.  Eyes: Right eye exhibits no discharge. Left eye exhibits no discharge.  Neck: Neck supple.  Cardiovascular: Normal rate, regular rhythm and intact distal pulses.  Pulmonary/Chest: Effort normal and breath sounds normal. No respiratory distress. He has no wheezes. He has no rales.   Abdominal: Soft. Bowel sounds are normal. He exhibits no distension. There is no abdominal tenderness.  Musculoskeletal:        General: Edema present.     Comments: Left great toe erythematous and erythematous. Erythema and edema extending up to the dorsum of the foot and anterior aspect of the left lower extremity.  Please see images. Dorsalis pedis pulse diminished bilaterally.  Neurological: He is alert and oriented to person, place, and time.  Skin: Skin is warm and dry. He is not diaphoretic.         Labs on Admission: I have personally reviewed following labs and imaging studies  CBC: Recent Labs  Lab 07/12/19 2356  WBC 7.4  HGB 13.8  HCT 39.5  MCV 94.3  PLT [id number]   Basic Metabolic Panel: Recent Labs  Lab 07/12/19 2356  MG 1.7  PHOS 3.0   GFR: CrCl cannot be calculated (Patient's most recent lab result is older than the maximum 21 days allowed.). Liver Function Tests: No results for input(s): AST, ALT, ALKPHOS, BILITOT, PROT, ALBUMIN in the last 168 hours. No results for input(s): LIPASE, AMYLASE in the last 168 hours. No results for input(s): AMMONIA in the last 168 hours. Coagulation Profile: No results for input(s): INR, PROTIME in the last 168 hours. Cardiac Enzymes: No results for input(s): CKTOTAL, CKMB, CKMBINDEX, TROPONINI in the last 168 hours. BNP (last 3 results) No results for input(s): PROBNP in the last 8760 hours. HbA1C: Recent Labs    07/12/19 2356  HGBA1C 6.6*   CBG: No results for input(s): GLUCAP in the last 168 hours. Lipid Profile: No results for input(s): CHOL, HDL, LDLCALC, TRIG, CHOLHDL, LDLDIRECT in the last 72 hours. Thyroid Function Tests: No results for input(s): TSH, T4TOTAL, FREET4, T3FREE, THYROIDAB in the last 72 hours. Anemia Panel: No results for input(s): VITAMINB12, FOLATE, FERRITIN, TIBC, IRON, RETICCTPCT in the last 72 hours. Urine analysis:    Component Value Date/Time   COLORURINE YELLOW 05/21/2018 1611    APPEARANCEUR CLEAR 05/21/2018 1611   LABSPEC 1.029 05/21/2018 1611   PHURINE 5.0 05/21/2018 1611   GLUCOSEU >=500 (A) 05/21/2018 1611   HGBUR NEGATIVE 05/21/2018 1611   BILIRUBINUR NEGATIVE 05/21/2018 1611   KETONESUR NEGATIVE 05/21/2018 1611   PROTEINUR NEGATIVE 05/21/2018 1611   UROBILINOGEN 4.0 (H) 10/09/2008 1114   NITRITE NEGATIVE 05/21/2018 1611   LEUKOCYTESUR NEGATIVE 05/21/2018 1611    Radiological Exams on Admission: Dg Foot Complete Left  Result Date: 07/12/2019 Please see detailed radiograph report in office note.   Assessment/Plan Principal Problem:   Cellulitis Active Problems:   Diabetes mellitus without complication ([place])   Hypertension   ETOH abuse   Toe fracture, left   Extensive left lower extremity cellulitis, left great toe fracture Slightly tachycardic.  Afebrile.  No leukocytosis. -X-ray of left foot was done at podiatry office.  Results are not uploaded yet.  Per podiatry note, patient found to have closed displaced fracture of proximal phalanx of left great toe. -Vancomycin and Zosyn -ABIs due to diminished pulses.  Dorsalis pedis pulses present on Doppler bilaterally (stronger on the left and slightly diminished on the right). No signs of acute limb ischemia. -Podiatry will follow along and evaluate for possible surgery after ABIs and improvement with antibiotics. -Patient is not complaining of pain and appears comfortable on exam.  Tylenol PRN.  Non-insulin-dependent type 2 diabetes -A1c 6.6.  Sliding scale insulin sensitive and CBG checks.  Hypertension -Currently normotensive.  Continue to monitor.  Alcohol abuse Patient drinks heavily on a regular basis.  States his last drink was this afternoon.  Mildly tachycardic but not displaying any other signs of withdrawal at this time. -CIWA protocol; Ativan PRN -Thiamine, folate, multivitamin  HIV screening The patient falls between the ages of 13-64 and should be screened for HIV, therefore HIV  testing ordered.  Pharmacy med rec pending.  DVT prophylaxis: SCDs at this time, BMP pending to assess renal function Code Status: Full code Family Communication: No family available. Disposition Plan: Anticipate discharge after clinical improvement. Consults called: Podiatry Admission status: It is my clinical opinion that admission to [place] is reasonable and necessary in this 53 y.o. male presenting with symptoms of left lower extremity cellulitis  and left great toe fracture.  Anticipate he will need IV antibiotics for several days given severity of cellulitis.  Given the aforementioned, the predictability of an adverse outcome is felt to be significant. I expect that the patient will require at least 2 midnights in the hospital to treat this condition.   The medical decision making on this patient was of high complexity and the patient is at high risk for clinical deterioration, therefore this is a level 3 visit.  Shela Leff MD [facility] Pager [phone]  If 7PM-7AM, please contact night-coverage [email or web address] Password [email or web address]  07/13/2019, 1:02 AM

## 2019-07-13 ENCOUNTER — Encounter (HOSPITAL_COMMUNITY): Payer: Self-pay | Admitting: Emergency Medicine

## 2019-07-13 ENCOUNTER — Inpatient Hospital Stay (HOSPITAL_COMMUNITY): Payer: Medicaid Other

## 2019-07-13 DIAGNOSIS — I739 Peripheral vascular disease, unspecified: Secondary | ICD-10-CM

## 2019-07-13 DIAGNOSIS — L03032 Cellulitis of left toe: Secondary | ICD-10-CM

## 2019-07-13 DIAGNOSIS — S92912A Unspecified fracture of left toe(s), initial encounter for closed fracture: Secondary | ICD-10-CM

## 2019-07-13 DIAGNOSIS — L03116 Cellulitis of left lower limb: Principal | ICD-10-CM

## 2019-07-13 LAB — GLUCOSE, CAPILLARY
Glucose-Capillary: 241 mg/dL — ABNORMAL HIGH (ref 70–99)
Glucose-Capillary: 222 mg/dL — ABNORMAL HIGH (ref 70–99)
Glucose-Capillary: 297 mg/dL — ABNORMAL HIGH (ref 70–99)
Glucose-Capillary: 275 mg/dL — ABNORMAL HIGH (ref 70–99)

## 2019-07-13 LAB — HEMOGLOBIN A1C
Hgb A1c MFr Bld: 6.6 % — ABNORMAL HIGH (ref 4.8–5.6)
Mean Plasma Glucose: 142.72 mg/dL

## 2019-07-13 LAB — BASIC METABOLIC PANEL
Anion gap: 10 (ref 5–15)
BUN: 19 mg/dL (ref 6–20)
CO2: 26 mmol/L (ref 22–32)
Calcium: 9.3 mg/dL (ref 8.9–10.3)
Chloride: 95 mmol/L — ABNORMAL LOW (ref 98–111)
Creatinine, Ser: 0.99 mg/dL (ref 0.61–1.24)
GFR calc Af Amer: >60 mL/min (ref >60)
GFR calc non Af Amer: >60 mL/min (ref >60)
Glucose, Bld: 173 mg/dL — ABNORMAL HIGH (ref 70–99)
Potassium: 4.5 mmol/L (ref 3.5–5.1)
Sodium: 131 mmol/L — ABNORMAL LOW (ref 135–145)

## 2019-07-13 LAB — SEDIMENTATION RATE: Sed Rate: 20 mm/hr — ABNORMAL HIGH (ref 0–16)

## 2019-07-13 LAB — CBC
HCT: 39.5 % (ref 39.0–52.0)
Hemoglobin: 13.8 g/dL (ref 13.0–17.0)
MCH: 32.9 pg (ref 26.0–34.0)
MCHC: 34.9 g/dL (ref 30.0–36.0)
MCV: 94.3 fL (ref 80.0–100.0)
Platelets: 161 K/uL (ref 150–400)
RBC: 4.19 MIL/uL — ABNORMAL LOW (ref 4.22–5.81)
RDW: 12.6 % (ref 11.5–15.5)
WBC: 7.4 K/uL (ref 4.0–10.5)
nRBC: 0 % (ref 0.0–0.2)

## 2019-07-13 LAB — PHOSPHORUS: Phosphorus: 3 mg/dL (ref 2.5–4.6)

## 2019-07-13 LAB — SURGICAL PCR SCREEN
MRSA, PCR: NEGATIVE
Staphylococcus aureus: NEGATIVE

## 2019-07-13 LAB — C-REACTIVE PROTEIN: CRP: 5.4 mg/dL — ABNORMAL HIGH (ref <1.0)

## 2019-07-13 LAB — MAGNESIUM: Magnesium: 1.7 mg/dL (ref 1.7–2.4)

## 2019-07-13 LAB — HIV ANTIBODY (ROUTINE TESTING W REFLEX): HIV Screen 4th Generation wRfx: NONREACTIVE

## 2019-07-13 LAB — SARS CORONAVIRUS 2 (TAT 6-24 HRS): SARS Coronavirus 2: NEGATIVE

## 2019-07-13 MED ORDER — VANCOMYCIN HCL IN DEXTROSE 1-5 GM/200ML-% IV SOLN: 1000.0000 mg | Freq: Once | INTRAVENOUS | Status: DC

## 2019-07-13 MED ORDER — VANCOMYCIN HCL 10 G IV SOLR
1500.0000 mg | Freq: Two times a day (BID) | INTRAVENOUS | Status: DC
  Administered 2019-07-13 – 2019-07-17 (×10): 1500 mg via INTRAVENOUS
  Filled 2019-07-13 (×12): qty 1500

## 2019-07-13 MED ORDER — HEPARIN SODIUM (PORCINE) 5000 UNIT/ML IJ SOLN
5000.0000 [IU] | Freq: Three times a day (TID) | INTRAMUSCULAR | Status: DC
  Administered 2019-07-13 – 2019-07-17 (×11): 5000 [IU] via SUBCUTANEOUS
  Filled 2019-07-13 (×13): qty 1

## 2019-07-13 MED ORDER — VANCOMYCIN HCL 10 G IV SOLR
2000.0000 mg | Freq: Once | INTRAVENOUS | Status: AC
  Administered 2019-07-13: 2000 mg via INTRAVENOUS
  Filled 2019-07-13: qty 2000

## 2019-07-13 MED ORDER — HYDRALAZINE HCL 20 MG/ML IJ SOLN: 10.0000 mg | Freq: Four times a day (QID) | INTRAMUSCULAR | Status: DC | PRN

## 2019-07-13 MED ORDER — PIPERACILLIN-TAZOBACTAM 3.375 G IVPB
3.3750 g | Freq: Three times a day (TID) | INTRAVENOUS | Status: DC
  Administered 2019-07-13 – 2019-07-18 (×16): 3.375 g via INTRAVENOUS
  Filled 2019-07-13 (×15): qty 50

## 2019-07-13 MED ORDER — PIPERACILLIN-TAZOBACTAM 3.375 G IVPB 30 MIN: 3.3750 g | Freq: Once | INTRAVENOUS | Status: DC

## 2019-07-13 MED ORDER — INFLUENZA VAC SPLIT QUAD 0.5 ML IM SUSY
0.5000 mL | PREFILLED_SYRINGE | INTRAMUSCULAR | Status: AC
  Administered 2019-07-14: 0.5 mL via INTRAMUSCULAR
  Filled 2019-07-13: qty 0.5

## 2019-07-13 MED ORDER — CHLORDIAZEPOXIDE HCL 5 MG PO CAPS
15.0000 mg | ORAL_CAPSULE | Freq: Three times a day (TID) | ORAL | Status: DC
  Administered 2019-07-13 – 2019-07-18 (×16): 15 mg via ORAL
  Filled 2019-07-13 (×16): qty 3

## 2019-07-13 MED ORDER — CARVEDILOL 3.125 MG PO TABS
3.1250 mg | ORAL_TABLET | Freq: Two times a day (BID) | ORAL | Status: DC
  Administered 2019-07-13 – 2019-07-18 (×10): 3.125 mg via ORAL
  Filled 2019-07-13 (×10): qty 1

## 2019-07-13 NOTE — Progress Notes (Signed)
Subjective:  Patient ID: Eugene Diaz, male    DOB: 12/08/1965,  MRN: [id number]  Seen at bedside. Denies new complaints. Receiving abx does not think there has been much improvement in the redness in his leg. Objective:   Vitals:   07/12/19 2128 07/13/19 0812  BP: 138/82 140/80  Pulse: (!) 109 80  Resp: 19 14  Temp: 98.9 F (37.2 C) 97.8 F (36.6 C)  SpO2: 96% 97%   General AA&O x3. Normal mood and affect.  Vascular Left foot warm with excessive cooling distally.  Neurologic Epicritic sensation grossly diminished.  Dermatologic Left hallux cellulitis, blistering proximally. Left leg cellulitis  Orthopedic: Crepitus on ROM of L hallux   Results for orders placed or performed during the hospital encounter of 07/12/19 (from the past 24 hour(s))  Magnesium     Status: None   Collection Time: 07/12/19 11:56 PM  Result Value Ref Range   Magnesium 1.7 1.7 - 2.4 mg/dL  Phosphorus     Status: None   Collection Time: 07/12/19 11:56 PM  Result Value Ref Range   Phosphorus 3.0 2.5 - 4.6 mg/dL  Hemoglobin A1c     Status: Abnormal   Collection Time: 07/12/19 11:56 PM  Result Value Ref Range   Hgb A1c MFr Bld 6.6 (H) 4.8 - 5.6 %   Mean Plasma Glucose 142.72 mg/dL  CBC     Status: Abnormal   Collection Time: 07/12/19 11:56 PM  Result Value Ref Range   WBC 7.4 4.0 - 10.5 K/uL   RBC 4.19 (L) 4.22 - 5.81 MIL/uL   Hemoglobin 13.8 13.0 - 17.0 g/dL   HCT 39.5 39.0 - 52.0 %   MCV 94.3 80.0 - 100.0 fL   MCH 32.9 26.0 - 34.0 pg   MCHC 34.9 30.0 - 36.0 g/dL   RDW 12.6 11.5 - 15.5 %   Platelets 161 150 - 400 K/uL   nRBC 0.0 0.0 - 0.2 %  Basic metabolic panel     Status: Abnormal   Collection Time: 07/12/19 11:56 PM  Result Value Ref Range   Sodium 131 (L) 135 - 145 mmol/L   Potassium 4.5 3.5 - 5.1 mmol/L   Chloride 95 (L) 98 - 111 mmol/L   CO2 26 22 - 32 mmol/L   Glucose, Bld 173 (H) 70 - 99 mg/dL   BUN 19 6 - 20 mg/dL   Creatinine, Ser 0.99 0.61 - 1.24 mg/dL   Calcium 9.3 8.9 -  10.3 mg/dL   GFR calc non Af Amer >60 >60 mL/min   GFR calc Af Amer >60 >60 mL/min   Anion gap 10 5 - 15  Surgical pcr screen     Status: None   Collection Time: 07/13/19 12:08 AM   Specimen: Nasal Mucosa; Nasal Swab  Result Value Ref Range   MRSA, PCR NEGATIVE NEGATIVE   Staphylococcus aureus NEGATIVE NEGATIVE  SARS CORONAVIRUS 2 (TAT 6-24 HRS) Nasopharyngeal Nasopharyngeal Swab     Status: None   Collection Time: 07/13/19  4:20 AM   Specimen: Nasopharyngeal Swab  Result Value Ref Range   SARS Coronavirus 2 NEGATIVE NEGATIVE  Glucose, capillary     Status: Abnormal   Collection Time: 07/13/19  6:23 AM  Result Value Ref Range   Glucose-Capillary 241 (H) 70 - 99 mg/dL    Assessment & Plan:  Patient was evaluated and treated and all questions answered.  Left Hallux Cellulitis, L leg cellulitis -Crepitus on ROM likely 2/2 fracture, no fluctuance concerning for underlying  abscess. -Continue IV Abx. Should the toe or leg fail to clinically improve discussed with patient he may benefit from left hallux amputation. -Labs reviewed. Order baseline ESR/CRP. -Will have patient NPO tonight in case the area does not improve, for possible surgery tomorrow. -WBAT LLE in Surgical shoe. Shoe ordered.  Evelina Bucy, DPM  Accessible via secure chat for questions or concerns.

## 2019-07-13 NOTE — Progress Notes (Signed)
Orthopedic Tech Progress Note Patient Details:  Eugene Diaz 1966/03/16 [id number]  Ortho Devices Type of Ortho Device: Postop shoe/boot Ortho Device/Splint Location: LLE Ortho Device/Splint Interventions: Adjustment, Application, Ordered   Post Interventions Patient Tolerated: Well Instructions Provided: Care of device, Adjustment of device   Janit Pagan 07/13/2019, 11:19 AM

## 2019-07-13 NOTE — Progress Notes (Signed)
VASCULAR LAB PRELIMINARY  PRELIMINARY  PRELIMINARY  PRELIMINARY  ABIs completed.    Preliminary report:  See CV proc for preliminary results.   Dillin Lofgren, RVT 07/13/2019, 9:49 AM

## 2019-07-13 NOTE — Progress Notes (Signed)
PROGRESS NOTE                                                                                                                                                                                                             Patient Demographics:    Eugene Diaz, is a 53 y.o. male, DOB - [birth year], [id number]  Admit date - 07/12/2019   Admitting Physician Mauricio Gerome Apley, MD  Outpatient Primary MD for the patient is Redmond School, MD  LOS - 1  CC - left toe infection.     Brief Narrative  Eugene Diaz is a 53 y.o. male with medical history significant of anxiety, arthritis, asthma, depression, type 2 diabetes, hypertension, hyperlipidemia, neuropathy, peripheral vascular disease presenting to the hospital as a direct admit from podiatry for left great toe fracture and left lower extremity cellulitis.  Patient states about a week ago he was playing with his grandkids and while running bumped his left great toe into a railroad track.     Subjective:    Shirley Friar today has, No headache, No chest pain, No abdominal pain - No Nausea, No new weakness tingling or numbness, No Cough - SOB.    Assessment  & Plan :     1.  Left big toe, foot and leg cellulitis big toe fracture. Seen by podiatry, agree with IV vancomycin and Zosyn combination along with supportive care for pain control.  [name] stable.  Potential surgery tomorrow by podiatry.  N.p.o. after midnight.  Will get baseline chest x-ray and EKG prior to surgery.   2.  Alcohol use.  Counseled to quit.  Placed on Librium along with CIWA protocol.  3.  Hypertension.  Placed on low-dose Coreg along with PRN hydralazine.  4.  DM type II.  A1c 6.6, sliding scale for now.  CBG (last 3)  Recent Labs    07/13/19 0623 07/13/19 1122  GLUCAP 241* 222*     Family Communication  :  None  Code Status :  Full  Disposition Plan  :  Inpt  Consults  :  Podiatry  Procedures  :    [name] - stable.  DVT Prophylaxis   :   Heparin ordered    Lab Results  Component Value Date   PLT 161 07/12/2019    Diet :  Diet Order            Diet NPO time specified  Diet effective midnight        Diet  Carb Modified Fluid consistency: Thin; Room service appropriate? Yes  Diet effective now               Inpatient Medications Scheduled Meds: . chlordiazePOXIDE  15 mg Oral TID  . folic acid  1 mg Oral Daily  . [START ON 07/14/2019] influenza vac split quadrivalent PF  0.5 mL Intramuscular Tomorrow-1000  . insulin aspart  0-5 Units Subcutaneous QHS  . insulin aspart  0-9 Units Subcutaneous TID WC  . multivitamin with minerals  1 tablet Oral Daily  . thiamine  100 mg Oral Daily   Or  . thiamine  100 mg Intravenous Daily   Continuous Infusions: . piperacillin-tazobactam (ZOSYN)  IV 3.375 g (07/13/19 0851)  . vancomycin 1,500 mg (07/13/19 1138)   PRN Meds:.acetaminophen **OR** acetaminophen, LORazepam **OR** LORazepam  Antibiotics  :   Anti-infectives (From admission, onward)   Start     Dose/Rate Route Frequency Ordered Stop   07/13/19 1000  vancomycin (VANCOCIN) 1,500 mg in sodium chloride 0.9 % 500 mL IVPB     1,500 mg 250 mL/hr over 120 Minutes Intravenous Every 12 hours 07/13/19 0210     07/13/19 0030  vancomycin (VANCOCIN) 2,000 mg in sodium chloride 0.9 % 500 mL IVPB     2,000 mg 250 mL/hr over 120 Minutes Intravenous  Once 07/13/19 0009 07/13/19 0309   07/13/19 0015  piperacillin-tazobactam (ZOSYN) IVPB 3.375 g  Status:  Discontinued     3.375 g 100 mL/hr over 30 Minutes Intravenous  Once 07/13/19 0006 07/13/19 0009   07/13/19 0015  vancomycin (VANCOCIN) IVPB 1000 mg/200 mL premix  Status:  Discontinued     1,000 mg 200 mL/hr over 60 Minutes Intravenous  Once 07/13/19 0006 07/13/19 0009   07/13/19 0015  piperacillin-tazobactam (ZOSYN) IVPB 3.375 g     3.375 g 12.5 mL/hr over 240 Minutes Intravenous Every 8 hours 07/13/19 0009            Objective:   Vitals:   07/12/19 2128 07/13/19  0300 07/13/19 0812  BP: 138/82  140/80  Pulse: (!) 109  80  Resp: 19  14  Temp: 98.9 F (37.2 C)  97.8 F (36.6 C)  TempSrc: Oral  Oral  SpO2: 96%  97%  Weight:  130.2 kg   Height:  6\' 2"  (1.88 m)     Wt Readings from Last 3 Encounters:  07/13/19 130.2 kg  06/14/19 130.2 kg  12/19/18 133.8 kg     Intake/Output Summary (Last 24 hours) at 07/13/2019 1153 Last data filed at 07/13/2019 0900 Gross per 24 hour  Intake 1430 ml  Output -  Net 1430 ml     Physical Exam  Awake Alert, Oriented X 3, No new F.N deficits, Normal affect [place].AT,PERRAL Supple Neck,No JVD, No cervical lymphadenopathy appriciated.  Symmetrical Chest wall movement, Good air movement bilaterally, CTAB RRR,No Gallops,Rubs or new Murmurs, No Parasternal Heave +ve B.Sounds, Abd Soft, No tenderness, No organomegaly appriciated, No rebound - guarding or rigidity. No Cyanosis, left great toe cellulitis with some surrounding cellulitis in the left foot and below-knee leg as well.          Data Review:    CBC Recent Labs  Lab 07/12/19 2356  WBC 7.4  HGB 13.8  HCT 39.5  PLT 161  MCV 94.3  MCH 32.9  MCHC 34.9  RDW 12.6    Chemistries  Recent Labs  Lab 07/12/19 2356  NA 131*  K 4.5  CL 95*  CO2 26  GLUCOSE 173*  BUN 19  CREATININE 0.99  CALCIUM 9.3  MG 1.7   ------------------------------------------------------------------------------------------------------------------ No results for input(s): CHOL, HDL, LDLCALC, TRIG, CHOLHDL, LDLDIRECT in the last 72 hours.  Lab Results  Component Value Date   HGBA1C 6.6 (H) 07/12/2019   ------------------------------------------------------------------------------------------------------------------ No results for input(s): TSH, T4TOTAL, T3FREE, THYROIDAB in the last 72 hours.  Invalid input(s): FREET3 ------------------------------------------------------------------------------------------------------------------ No results for input(s):  VITAMINB12, FOLATE, FERRITIN, TIBC, IRON, RETICCTPCT in the last 72 hours.  Coagulation profile No results for input(s): INR, PROTIME in the last 168 hours.  No results for input(s): DDIMER in the last 72 hours.  Cardiac Enzymes No results for input(s): CKMB, TROPONINI, MYOGLOBIN in the last 168 hours.  Invalid input(s): CK ------------------------------------------------------------------------------------------------------------------    Component Value Date/Time   BNP 17.0 10/31/2018 1245    Micro Results Recent Results (from the past 240 hour(s))  Surgical pcr screen     Status: None   Collection Time: 07/13/19 12:08 AM   Specimen: Nasal Mucosa; Nasal Swab  Result Value Ref Range Status   MRSA, PCR NEGATIVE NEGATIVE Final   Staphylococcus aureus NEGATIVE NEGATIVE Final    Comment: (NOTE) The Xpert SA Assay (FDA approved for NASAL specimens in patients 77 years of age and older), is one component of a comprehensive surveillance program. It is not intended to diagnose infection nor to guide or monitor treatment. Performed at [facility], [place] [address] [place], [place] [address]   SARS CORONAVIRUS 2 (TAT 6-24 HRS) Nasopharyngeal Nasopharyngeal Swab     Status: None   Collection Time: 07/13/19  4:20 AM   Specimen: Nasopharyngeal Swab  Result Value Ref Range Status   SARS Coronavirus 2 NEGATIVE NEGATIVE Final    Comment: (NOTE) SARS-CoV-2 target nucleic acids are NOT DETECTED. The SARS-CoV-2 RNA is generally detectable in upper and lower respiratory specimens during the acute phase of infection. Negative results do not preclude SARS-CoV-2 infection, do not rule out co-infections with other pathogens, and should not be used as the sole basis for treatment or other patient management decisions. Negative results must be combined with clinical observations, patient history, and epidemiological information. The expected result is Negative. Fact Sheet for Patients:  SugarRoll.be Fact Sheet for Healthcare Providers: https://www.woods-mathews.com/ This test is not yet approved or cleared by the [place] FDA and  has been authorized for detection and/or diagnosis of SARS-CoV-2 by FDA under an Emergency Use Authorization (EUA). This EUA will remain  in effect (meaning this test can be used) for the duration of the COVID-19 declaration under Section 56 4(b)(1) of the Act, 21 U.S.C. section 360bbb-3(b)(1), unless the authorization is terminated or revoked sooner. Performed at [facility], [place] [address], [place], [place] [address]     Radiology Reports Dg Foot Complete Left  Result Date: 07/12/2019 Please see detailed radiograph report in office note.  Vas [place] Burnard Bunting With/wo Tbi  Result Date: 07/13/2019 LOWER EXTREMITY DOPPLER STUDY Indications: Great toe broken, cellulitis. High Risk Factors: Hypertension, hyperlipidemia, Diabetes. Other Factors: ETOH abuse.  Limitations: Today's exam was limited due to Left toe too swollen for toe cuff. Comparison Study: No prior study on file for comparison Performing Technologist: Sharion Dove RVS  Examination Guidelines: A complete evaluation includes at minimum, Doppler waveform signals and systolic blood pressure reading at the level of bilateral brachial, anterior tibial, and posterior tibial arteries, when vessel segments are accessible. Bilateral testing is considered an integral part of a complete examination. Photoelectric Plethysmograph (PPG) waveforms and  toe systolic pressure readings are included as required and additional duplex testing as needed. Limited examinations for reoccurring indications may be performed as noted.  [name] Findings: +---------+------------------+-----+---------+--------+ Right    Rt Pressure (mmHg)IndexWaveform Comment  +---------+------------------+-----+---------+--------+ Brachial 147                    triphasic          +---------+------------------+-----+---------+--------+ PTA      176               1.20                   +---------+------------------+-----+---------+--------+ DP       175               1.19                   +---------+------------------+-----+---------+--------+ Great Toe108               0.73                   +---------+------------------+-----+---------+--------+ +---------+------------------+-----+---------+----------------------------+ Left     Lt Pressure (mmHg)IndexWaveform Comment                      +---------+------------------+-----+---------+----------------------------+ Brachial 144                    triphasic                             +---------+------------------+-----+---------+----------------------------+ PTA      147               1.00                                       +---------+------------------+-----+---------+----------------------------+ DP       150               1.02                                       +---------+------------------+-----+---------+----------------------------+ Great Toe                                Toe too swollen for toe cuff +---------+------------------+-----+---------+----------------------------+ +-------+-----------+--------------+------------+------------+ [name]/TBIToday's ABIToday's TBI   Previous ABIPrevious TBI +-------+-----------+--------------+------------+------------+ Right  1.2        0.73                                   +-------+-----------+--------------+------------+------------+ Left   1.02       cuff too small                         +-------+-----------+--------------+------------+------------+  Summary: Right: Resting right ankle-brachial index is within normal range. No evidence of significant right lower extremity arterial disease. The right toe-brachial index is normal. Left: Resting left ankle-brachial index is within normal range. No evidence of significant left  lower extremity arterial disease.  *See table(s) above for measurements and observations.     Preliminary     Time Spent in minutes  30   My Rinke  Candiss Norse M.D on 07/13/2019 at 11:53 AM  To page go to [email or web address] - password [facility]

## 2019-07-13 NOTE — Progress Notes (Signed)
Patient has been using a surgical post op shoe today when ambulating.  There appears to be a wound to the left side of his left foot.  Foam dressing with kerlix applied.  The patient has been encouraged to keep foot elevated as much as possible.

## 2019-07-13 NOTE — Evaluation (Signed)
Physical Therapy Evaluation Patient Details Name: Eugene Diaz MRN: [id number] DOB: [birth year] Today's Date: 07/13/2019   History of Present Illness  Eugene Diaz is a 53 y.o. male with medical history significant of anxiety, arthritis, asthma, depression, type 2 diabetes, hypertension, hyperlipidemia, neuropathy, peripheral vascular disease presenting to the hospital as a direct admit from podiatry for left great toe fracture and left lower extremity cellulitis.  Clinical Impression  Pt admitted with above diagnosis. Pt mobilizing well but not mindful of foot wounds. Encouraged use of wide RW to decrease stress on L foot during ambulation. Pt agreeable to this and would benefit from wide RW for home use. Will follow up with pt after it is determined whether he needs surgery as status may change at that point. Pt ambulated 250' with supervision. Pt currently with functional limitations due to the deficits listed below (see PT Problem List). Pt will benefit from skilled PT to increase their independence and safety with mobility to allow discharge to the venue listed below.       Follow Up Recommendations No PT follow up    Equipment Recommendations  Rolling walker with 5" wheels(bariatric width)    Recommendations for Other Services       Precautions / Restrictions Precautions Precautions: Fall Required Braces or Orthoses: Other Brace Other Brace: L post op shoe Restrictions Weight Bearing Restrictions: No      Mobility  Bed Mobility Overal bed mobility: Modified Independent                Transfers Overall transfer level: Needs assistance Equipment used: Rolling walker (2 wheeled) Transfers: Sit to/from Stand Sit to Stand: Supervision         General transfer comment: supervision for safety  Ambulation/Gait Ambulation/Gait assistance: Supervision Gait Distance (Feet): 250 Feet Assistive device: Rolling walker (2 wheeled) Gait Pattern/deviations: Wide base  of support Gait velocity: WFL Gait velocity interpretation: >2.62 ft/sec, indicative of community ambulatory General Gait Details: pt with wide base and pattern typical of insensate feet, increased foot slep on floor  Stairs            Wheelchair Mobility    Modified Rankin (Stroke Patients Only)       Balance Overall balance assessment: Mild deficits observed, not formally tested                                           Pertinent Vitals/Pain Pain Assessment: No/denies pain    Home Living Family/patient expects to be discharged to:: Private residence Living Arrangements: Alone Available Help at Discharge: Family;Available PRN/intermittently Type of Home: Mobile home Home Access: Stairs to enter Entrance Stairs-Rails: None Entrance Stairs-Number of Steps: 2 Home Layout: One level Home Equipment: Cane - single point Additional Comments: pt's wife passed away 2 weeks ago. Kids have been checking on him regularly    Prior Function Level of Independence: Independent with assistive device(s)         Comments: uses SPC     Hand Dominance        Extremity/Trunk Assessment   Upper Extremity Assessment Upper Extremity Assessment: Overall WFL for tasks assessed    Lower Extremity Assessment Lower Extremity Assessment: RLE deficits/detail;LLE deficits/detail RLE Deficits / Details: noted some small wounds on R foot, strength WFL RLE Sensation: decreased light touch;decreased proprioception;history of peripheral neuropathy RLE Coordination: decreased gross motor LLE Deficits / Details: bandaging  to distal foot, wearing post op shoe. Strength LLE WFL LLE Sensation: decreased light touch;decreased proprioception;history of peripheral neuropathy LLE Coordination: decreased gross motor    Cervical / Trunk Assessment Cervical / Trunk Assessment: Normal  Communication   Communication: No difficulties  Cognition Arousal/Alertness:  Awake/alert Behavior During Therapy: WFL for tasks assessed/performed Overall Cognitive Status: Within Functional Limits for tasks assessed                                        General Comments General comments (skin integrity, edema, etc.): pt able to ambulate with SPC but educated in use of RW to decrease stress on LLE for healing purposes. Pt also educated on diabetic foot checks and foot care as he reports that one of the wounds on his foot occurred when he cut the bandage off his foot with a knife.     Exercises     Assessment/Plan    PT Assessment Patient needs continued PT services  PT Problem List Impaired sensation;Decreased knowledge of use of DME;Decreased coordination       PT Treatment Interventions DME instruction;Gait training;Stair training;Functional mobility training;Therapeutic activities;Therapeutic exercise;Balance training;Patient/family education    PT Goals (Current goals can be found in the Care Plan section)  Acute Rehab PT Goals Patient Stated Goal: return home PT Goal Formulation: With patient Time For Goal Achievement: 07/27/19 Potential to Achieve Goals: Good    Frequency Min 3X/week   Barriers to discharge Decreased caregiver support      Co-evaluation               AM-PAC PT "6 Clicks" Mobility  Outcome Measure Help needed turning from your back to your side while in a flat bed without using bedrails?: None Help needed moving from lying on your back to sitting on the side of a flat bed without using bedrails?: None Help needed moving to and from a bed to a chair (including a wheelchair)?: None Help needed standing up from a chair using your arms (e.g., wheelchair or bedside chair)?: None Help needed to walk in hospital room?: A Little Help needed climbing 3-5 steps with a railing? : A Little 6 Click Score: 22    End of Session   Activity Tolerance: Patient tolerated treatment well Patient left: in chair;with call  bell/phone within reach Nurse Communication: Mobility status PT Visit Diagnosis: Unsteadiness on feet (R26.81)    Time: [id number] PT Time Calculation (min) (ACUTE ONLY): 25 min   Charges:   PT Evaluation $PT Eval Moderate Complexity: 1 Mod PT Treatments $Gait Training: 8-22 mins        [place]  Pager [phone] Office [place] 07/13/2019, 1:19 PM

## 2019-07-13 NOTE — Progress Notes (Signed)
Pt stated " I lost my wife in 06/2019." Im kinda moving around, don't have a good place to stay ." L great toe swollen, and cracked. L lower shin area red and shiny, hot to touch. Unable to palpate dorsal pedis pulse on the LLE. R foot dorsal pedis pulse is faint. Called Covering MD to update and noted unablility to find a pulse.   Dr Patrecia Pour at the bedside, doppler obtained and returned to the 6th floor. Pt placed on tele per MD orders.

## 2019-07-13 NOTE — Plan of Care (Signed)
  Problem: Skin Integrity: Goal: Risk for impaired skin integrity will decrease Outcome: Progressing   Problem: Pain Managment: Goal: General experience of comfort will improve Outcome: Progressing   

## 2019-07-13 NOTE — Progress Notes (Signed)
Pharmacy Antibiotic Note  Eugene Diaz is a 53 y.o. male admitted on 07/12/2019 with LLE cellulitis .  Pharmacy has been consulted for Vancomycin and Zosyn  dosing.  Plan: Vancomycin 2 g IV now, then Vancomycin 1500 mg IV q12h Zosyn 3.375 g IV q8h     Temp (24hrs), Avg:98.5 F (36.9 C), Min:98.1 F (36.7 C), Max:98.9 F (37.2 C)  Recent Labs  Lab 07/12/19 2356  WBC 7.4  CREATININE 0.99    CrCl cannot be calculated (Unknown ideal weight.).    No Known Allergies    Caryl Pina 07/13/2019 2:09 AM

## 2019-07-14 ENCOUNTER — Inpatient Hospital Stay (HOSPITAL_COMMUNITY): Payer: Medicaid Other

## 2019-07-14 DIAGNOSIS — L03032 Cellulitis of left toe: Secondary | ICD-10-CM

## 2019-07-14 DIAGNOSIS — L0390 Cellulitis, unspecified: Secondary | ICD-10-CM

## 2019-07-14 DIAGNOSIS — R609 Edema, unspecified: Secondary | ICD-10-CM

## 2019-07-14 LAB — COMPREHENSIVE METABOLIC PANEL
ALT: 18 U/L (ref 0–44)
AST: 20 U/L (ref 15–41)
Albumin: 3.6 g/dL (ref 3.5–5.0)
Alkaline Phosphatase: 74 U/L (ref 38–126)
Anion gap: 9 (ref 5–15)
BUN: 11 mg/dL (ref 6–20)
CO2: 28 mmol/L (ref 22–32)
Calcium: 9.1 mg/dL (ref 8.9–10.3)
Chloride: 96 mmol/L — ABNORMAL LOW (ref 98–111)
Creatinine, Ser: 0.99 mg/dL (ref 0.61–1.24)
GFR calc Af Amer: >60 mL/min (ref >60)
GFR calc non Af Amer: >60 mL/min (ref >60)
Glucose, Bld: 266 mg/dL — ABNORMAL HIGH (ref 70–99)
Potassium: 4.5 mmol/L (ref 3.5–5.1)
Sodium: 133 mmol/L — ABNORMAL LOW (ref 135–145)
Total Bilirubin: 0.7 mg/dL (ref 0.3–1.2)
Total Protein: 7.1 g/dL (ref 6.5–8.1)

## 2019-07-14 LAB — CBC WITH DIFFERENTIAL/PLATELET
Abs Immature Granulocytes: 0.01 10*3/uL (ref 0.00–0.07)
Basophils Absolute: 0 10*3/uL (ref 0.0–0.1)
Basophils Relative: 1 %
Eosinophils Absolute: 0.3 10*3/uL (ref 0.0–0.5)
Eosinophils Relative: 7 %
HCT: 37.4 % — ABNORMAL LOW (ref 39.0–52.0)
Hemoglobin: 12.6 g/dL — ABNORMAL LOW (ref 13.0–17.0)
Immature Granulocytes: 0 %
Lymphocytes Relative: 38 %
Lymphs Abs: 1.7 10*3/uL (ref 0.7–4.0)
MCH: 33.1 pg (ref 26.0–34.0)
MCHC: 33.7 g/dL (ref 30.0–36.0)
MCV: 98.2 fL (ref 80.0–100.0)
Monocytes Absolute: 0.3 10*3/uL (ref 0.1–1.0)
Monocytes Relative: 7 %
Neutro Abs: 2.1 10*3/uL (ref 1.7–7.7)
Neutrophils Relative %: 47 %
Platelets: 149 10*3/uL — ABNORMAL LOW (ref 150–400)
RBC: 3.81 MIL/uL — ABNORMAL LOW (ref 4.22–5.81)
RDW: 13.1 % (ref 11.5–15.5)
WBC: 4.5 10*3/uL (ref 4.0–10.5)
nRBC: 0 % (ref 0.0–0.2)

## 2019-07-14 LAB — GLUCOSE, CAPILLARY
Glucose-Capillary: 247 mg/dL — ABNORMAL HIGH (ref 70–99)
Glucose-Capillary: 217 mg/dL — ABNORMAL HIGH (ref 70–99)
Glucose-Capillary: 243 mg/dL — ABNORMAL HIGH (ref 70–99)
Glucose-Capillary: 215 mg/dL — ABNORMAL HIGH (ref 70–99)

## 2019-07-14 LAB — MAGNESIUM: Magnesium: 1.9 mg/dL (ref 1.7–2.4)

## 2019-07-14 LAB — C-REACTIVE PROTEIN: CRP: 4.7 mg/dL — ABNORMAL HIGH (ref <1.0)

## 2019-07-14 LAB — BRAIN NATRIURETIC PEPTIDE: B Natriuretic Peptide: 26.6 pg/mL (ref 0.0–100.0)

## 2019-07-14 MED ORDER — INSULIN GLARGINE 100 UNIT/ML ~~LOC~~ SOLN
20.0000 [IU] | Freq: Every day | SUBCUTANEOUS | Status: DC
  Administered 2019-07-15 – 2019-07-18 (×4): 20 [IU] via SUBCUTANEOUS
  Filled 2019-07-14 (×5): qty 0.2

## 2019-07-14 MED ORDER — LACTATED RINGERS IV SOLN
INTRAVENOUS | Status: DC
  Administered 2019-07-14: 13:00:00 via INTRAVENOUS

## 2019-07-14 NOTE — Progress Notes (Signed)
Patient has not had MRI performed. No plan for surgery tonight. Will lift NPO. Resume at midnight for possible surgery tomorrow if indicated.

## 2019-07-14 NOTE — Progress Notes (Signed)
PROGRESS NOTE                                                                                                                                                                                                             Patient Demographics:    Eugene Diaz, is a 53 y.o. male, DOB - January 31, 1966, [id number]  Admit date - 07/12/2019   Admitting Physician Mauricio Gerome Apley, MD  Outpatient Primary MD for the patient is Redmond School, MD  LOS - 2  CC - left toe infection.     Brief Narrative  Eugene Diaz is a 53 y.o. male with medical history significant of anxiety, arthritis, asthma, depression, type 2 diabetes, hypertension, hyperlipidemia, neuropathy, peripheral vascular disease presenting to the hospital as a direct admit from podiatry for left great toe fracture and left lower extremity cellulitis.  Patient states about a week ago he was playing with his grandkids and while running bumped his left great toe into a railroad track.     Subjective:   Patient in bed, appears comfortable, denies any headache, no fever, no chest pain or pressure, no shortness of breath , no abdominal pain. No focal weakness.   Assessment  & Plan :     1.  Left big toe, foot and leg cellulitis big toe fracture. Seen by podiatry, agree with IV vancomycin and Zosyn combination along with supportive care for pain control.  [name] stable.  Podiatry following, currently has regular shoe available when ambulating, has been kept n.p.o. overnight, podiatry to evaluate for any surgical intervention today.  Baseline EKG and chest x-ray reviewed both unremarkable.   2.  Alcohol use.  Counseled to quit.  Placed on Librium along with CIWA protocol.  Currently no signs of DTs.  3.  Hypertension.  Placed on low-dose Coreg along with PRN hydralazine.  Blood pressure currently stable.  4.  DM type II.  A1c 6.6, currently on Lantus along with sliding scale for good control.  CBGs high due to stress.   CBG (last 3)  Recent Labs    07/13/19 1730 07/13/19 2114 07/14/19 0636  GLUCAP 297* 275* 247*     Family Communication  :  None  Code Status :  Full  Disposition Plan  :  Inpt  Consults  :  Podiatry  Procedures  :    [name] - stable.  DVT Prophylaxis  :   Heparin ordered    Lab Results  Component Value Date   PLT 149 (  L) 07/14/2019    Diet :  Diet Order            Diet NPO time specified  Diet effective midnight               Inpatient Medications Scheduled Meds: . carvedilol  3.125 mg Oral BID WC  . chlordiazePOXIDE  15 mg Oral TID  . folic acid  1 mg Oral Daily  . heparin injection (subcutaneous)  5,000 Units Subcutaneous Q8H  . influenza vac split quadrivalent PF  0.5 mL Intramuscular Tomorrow-1000  . insulin aspart  0-5 Units Subcutaneous QHS  . insulin aspart  0-9 Units Subcutaneous TID WC  . insulin glargine  20 Units Subcutaneous Daily  . multivitamin with minerals  1 tablet Oral Daily  . thiamine  100 mg Oral Daily   Or  . thiamine  100 mg Intravenous Daily   Continuous Infusions: . piperacillin-tazobactam (ZOSYN)  IV 3.375 g (07/14/19 0525)  . vancomycin 1,500 mg (07/13/19 2236)   PRN Meds:.acetaminophen **OR** acetaminophen, hydrALAZINE, LORazepam **OR** LORazepam  Antibiotics  :   Anti-infectives (From admission, onward)   Start     Dose/Rate Route Frequency Ordered Stop   07/13/19 1000  vancomycin (VANCOCIN) 1,500 mg in sodium chloride 0.9 % 500 mL IVPB     1,500 mg 250 mL/hr over 120 Minutes Intravenous Every 12 hours 07/13/19 0210     07/13/19 0030  vancomycin (VANCOCIN) 2,000 mg in sodium chloride 0.9 % 500 mL IVPB     2,000 mg 250 mL/hr over 120 Minutes Intravenous  Once 07/13/19 0009 07/13/19 0309   07/13/19 0015  piperacillin-tazobactam (ZOSYN) IVPB 3.375 g  Status:  Discontinued     3.375 g 100 mL/hr over 30 Minutes Intravenous  Once 07/13/19 0006 07/13/19 0009   07/13/19 0015  vancomycin (VANCOCIN) IVPB 1000 mg/200 mL premix   Status:  Discontinued     1,000 mg 200 mL/hr over 60 Minutes Intravenous  Once 07/13/19 0006 07/13/19 0009   07/13/19 0015  piperacillin-tazobactam (ZOSYN) IVPB 3.375 g     3.375 g 12.5 mL/hr over 240 Minutes Intravenous Every 8 hours 07/13/19 0009            Objective:   Vitals:   07/13/19 1733 07/13/19 1934 07/14/19 0324 07/14/19 0737  BP: 128/85 (!) 150/83 134/69 134/78  Pulse: 87 82 77 72  Resp: 16 17 18 14   Temp: 98 F (36.7 C) 97.9 F (36.6 C) (!) 97.3 F (36.3 C) 97.8 F (36.6 C)  TempSrc: Oral Oral Oral Oral  SpO2: 99% 99% 100% 100%  Weight:      Height:        Wt Readings from Last 3 Encounters:  07/13/19 130.2 kg  06/14/19 130.2 kg  12/19/18 133.8 kg     Intake/Output Summary (Last 24 hours) at 07/14/2019 0949 Last data filed at 07/14/2019 [id number] Gross per 24 hour  Intake 1750 ml  Output -  Net 1750 ml     Physical Exam  Awake Alert, Oriented X 3, No new F.N deficits, Normal affect [place].AT,PERRAL Supple Neck,No JVD, No cervical lymphadenopathy appriciated.  Symmetrical Chest wall movement, Good air movement bilaterally, CTAB RRR,No Gallops, Rubs or new Murmurs, No Parasternal Heave +ve B.Sounds, Abd Soft, No tenderness, No organomegaly appriciated, No rebound - guarding or rigidity. No Cyanosis,   left great toe cellulitis with some surrounding cellulitis in the left foot and below-knee leg as well.          Data  Review:    CBC Recent Labs  Lab 07/12/19 2356 07/14/19 0452  WBC 7.4 4.5  HGB 13.8 12.6*  HCT 39.5 37.4*  PLT 161 149*  MCV 94.3 98.2  MCH 32.9 33.1  MCHC 34.9 33.7  RDW 12.6 13.1  LYMPHSABS  --  1.7  MONOABS  --  0.3  EOSABS  --  0.3  BASOSABS  --  0.0    Chemistries  Recent Labs  Lab 07/12/19 2356 07/14/19 0452  NA 131* 133*  K 4.5 4.5  CL 95* 96*  CO2 26 28  GLUCOSE 173* 266*  BUN 19 11  CREATININE 0.99 0.99  CALCIUM 9.3 9.1  MG 1.7 1.9  AST  --  20  ALT  --  18  ALKPHOS  --  74  BILITOT  --  0.7    ------------------------------------------------------------------------------------------------------------------ No results for input(s): CHOL, HDL, LDLCALC, TRIG, CHOLHDL, LDLDIRECT in the last 72 hours.  Lab Results  Component Value Date   HGBA1C 6.6 (H) 07/12/2019   ------------------------------------------------------------------------------------------------------------------ No results for input(s): TSH, T4TOTAL, T3FREE, THYROIDAB in the last 72 hours.  Invalid input(s): FREET3 ------------------------------------------------------------------------------------------------------------------ No results for input(s): VITAMINB12, FOLATE, FERRITIN, TIBC, IRON, RETICCTPCT in the last 72 hours.  Coagulation profile No results for input(s): INR, PROTIME in the last 168 hours.  No results for input(s): DDIMER in the last 72 hours.  Cardiac Enzymes No results for input(s): CKMB, TROPONINI, MYOGLOBIN in the last 168 hours.  Invalid input(s): CK ------------------------------------------------------------------------------------------------------------------    Component Value Date/Time   BNP 26.6 07/14/2019 0452    Micro Results Recent Results (from the past 240 hour(s))  Surgical pcr screen     Status: None   Collection Time: 07/13/19 12:08 AM   Specimen: Nasal Mucosa; Nasal Swab  Result Value Ref Range Status   MRSA, PCR NEGATIVE NEGATIVE Final   Staphylococcus aureus NEGATIVE NEGATIVE Final    Comment: (NOTE) The Xpert SA Assay (FDA approved for NASAL specimens in patients 25 years of age and older), is one component of a comprehensive surveillance program. It is not intended to diagnose infection nor to guide or monitor treatment. Performed at [facility], [place] [address] [place], [place] [address]   SARS CORONAVIRUS 2 (TAT 6-24 HRS) Nasopharyngeal Nasopharyngeal Swab     Status: None   Collection Time: 07/13/19  4:20 AM   Specimen: Nasopharyngeal Swab   Result Value Ref Range Status   SARS Coronavirus 2 NEGATIVE NEGATIVE Final    Comment: (NOTE) SARS-CoV-2 target nucleic acids are NOT DETECTED. The SARS-CoV-2 RNA is generally detectable in upper and lower respiratory specimens during the acute phase of infection. Negative results do not preclude SARS-CoV-2 infection, do not rule out co-infections with other pathogens, and should not be used as the sole basis for treatment or other patient management decisions. Negative results must be combined with clinical observations, patient history, and epidemiological information. The expected result is Negative. Fact Sheet for Patients: SugarRoll.be Fact Sheet for Healthcare Providers: https://www.woods-mathews.com/ This test is not yet approved or cleared by the [place] FDA and  has been authorized for detection and/or diagnosis of SARS-CoV-2 by FDA under an Emergency Use Authorization (EUA). This EUA will remain  in effect (meaning this test can be used) for the duration of the COVID-19 declaration under Section 56 4(b)(1) of the Act, 21 U.S.C. section 360bbb-3(b)(1), unless the authorization is terminated or revoked sooner. Performed at [facility], [place] [address] [place], [place] [address]  Radiology Reports Dg Chest Port 1 View  Result Date: 07/13/2019 CLINICAL DATA:  Cellulitis.  Pre-op respiratory exam EXAM: PORTABLE CHEST 1 VIEW COMPARISON:  10/31/2018 FINDINGS: Stable borderline cardiomegaly. Both lungs are clear. No evidence of pneumothorax or pleural effusion. IMPRESSION: Stable borderline cardiomegaly. No active lung disease. Electronically Signed   By: Marlaine Hind M.D.   On: 07/13/2019 15:09   Dg Foot Complete Left  Result Date: 07/12/2019 Please see detailed radiograph report in office note.  Vas [place] Burnard Bunting With/wo Tbi  Result Date: 07/13/2019 LOWER EXTREMITY DOPPLER STUDY Indications: Great toe broken, cellulitis.  High Risk Factors: Hypertension, hyperlipidemia, Diabetes. Other Factors: ETOH abuse.  Limitations: Today's exam was limited due to Left toe too swollen for toe cuff. Comparison Study: No prior study on file for comparison Performing Technologist: Sharion Dove RVS  Examination Guidelines: A complete evaluation includes at minimum, Doppler waveform signals and systolic blood pressure reading at the level of bilateral brachial, anterior tibial, and posterior tibial arteries, when vessel segments are accessible. Bilateral testing is considered an integral part of a complete examination. Photoelectric Plethysmograph (PPG) waveforms and toe systolic pressure readings are included as required and additional duplex testing as needed. Limited examinations for reoccurring indications may be performed as noted.  [name] Findings: +---------+------------------+-----+---------+--------+ Right    Rt Pressure (mmHg)IndexWaveform Comment  +---------+------------------+-----+---------+--------+ Brachial 147                    triphasic         +---------+------------------+-----+---------+--------+ PTA      176               1.20 biphasic          +---------+------------------+-----+---------+--------+ DP       175               1.19 biphasic          +---------+------------------+-----+---------+--------+ Great Toe108               0.73                   +---------+------------------+-----+---------+--------+ +---------+------------------+-----+---------+----------------------------+ Left     Lt Pressure (mmHg)IndexWaveform Comment                      +---------+------------------+-----+---------+----------------------------+ Brachial 144                    triphasic                             +---------+------------------+-----+---------+----------------------------+ PTA      147               1.00 biphasic                               +---------+------------------+-----+---------+----------------------------+ DP       150               1.02 biphasic                              +---------+------------------+-----+---------+----------------------------+ Great Toe                                Toe too swollen for toe cuff +---------+------------------+-----+---------+----------------------------+ +-------+-----------+--------------+------------+------------+ [name]/TBIToday's ABIToday's TBI  Previous ABIPrevious TBI +-------+-----------+--------------+------------+------------+ Right  1.2        0.73                                   +-------+-----------+--------------+------------+------------+ Left   1.02       cuff too small                         +-------+-----------+--------------+------------+------------+  Summary: Right: Resting right ankle-brachial index is within normal range. No evidence of significant right lower extremity arterial disease. The right toe-brachial index is normal. Left: Resting left ankle-brachial index is within normal range. No evidence of significant left lower extremity arterial disease.  *See table(s) above for measurements and observations.  Electronically signed by Servando Snare MD on 07/13/2019 at 12:54:43 PM.    Final     Time Spent in minutes  30   Lala Lund M.D on 07/14/2019 at 9:49 AM  To page go to [email or web address] - password [email or web address]

## 2019-07-14 NOTE — Progress Notes (Signed)
Subjective:  Patient ID: Eugene Diaz, male    DOB: 1966-10-22,  MRN: [id number]  Seen at bedside. Denies new complaints. Receiving abx does not think there has been much improvement in the redness in his leg.  Objective:   Vitals:   07/14/19 0324 07/14/19 0737  BP: 134/69 134/78  Pulse: 77 72  Resp: 18 14  Temp: (!) 97.3 F (36.3 C) 97.8 F (36.6 C)  SpO2: 100% 100%   General AA&O x3. Normal mood and affect.  Vascular Left foot warm with excessive cooling distally. Delayed capillary refill time to the digit.  Neurologic Epicritic sensation grossly diminished.  Dermatologic Left hallux cellulitis, blistering proximally. Cellulitis hallux and left leg  Orthopedic: Crepitus on ROM of L hallux   Results for orders placed or performed during the hospital encounter of 07/12/19 (from the past 24 hour(s))  Sedimentation rate     Status: Abnormal   Collection Time: 07/13/19 11:42 AM  Result Value Ref Range   Sed Rate 20 (H) 0 - 16 mm/hr  C-reactive protein     Status: Abnormal   Collection Time: 07/13/19 11:42 AM  Result Value Ref Range   CRP 5.4 (H) <1.0 mg/dL  Glucose, capillary     Status: Abnormal   Collection Time: 07/13/19  5:30 PM  Result Value Ref Range   Glucose-Capillary 297 (H) 70 - 99 mg/dL  Glucose, capillary     Status: Abnormal   Collection Time: 07/13/19  9:14 PM  Result Value Ref Range   Glucose-Capillary 275 (H) 70 - 99 mg/dL  CBC with Differential/Platelet     Status: Abnormal   Collection Time: 07/14/19  4:52 AM  Result Value Ref Range   WBC 4.5 4.0 - 10.5 K/uL   RBC 3.81 (L) 4.22 - 5.81 MIL/uL   Hemoglobin 12.6 (L) 13.0 - 17.0 g/dL   HCT 37.4 (L) 39.0 - 52.0 %   MCV 98.2 80.0 - 100.0 fL   MCH 33.1 26.0 - 34.0 pg   MCHC 33.7 30.0 - 36.0 g/dL   RDW 13.1 11.5 - 15.5 %   Platelets 149 (L) 150 - 400 K/uL   nRBC 0.0 0.0 - 0.2 %   Neutrophils Relative % 47 %   Neutro Abs 2.1 1.7 - 7.7 K/uL   Lymphocytes Relative 38 %   Lymphs Abs 1.7 0.7 - 4.0 K/uL   Monocytes Relative 7 %   Monocytes Absolute 0.3 0.1 - 1.0 K/uL   Eosinophils Relative 7 %   Eosinophils Absolute 0.3 0.0 - 0.5 K/uL   Basophils Relative 1 %   Basophils Absolute 0.0 0.0 - 0.1 K/uL   Immature Granulocytes 0 %   Abs Immature Granulocytes 0.01 0.00 - 0.07 K/uL  Comprehensive metabolic panel     Status: Abnormal   Collection Time: 07/14/19  4:52 AM  Result Value Ref Range   Sodium 133 (L) 135 - 145 mmol/L   Potassium 4.5 3.5 - 5.1 mmol/L   Chloride 96 (L) 98 - 111 mmol/L   CO2 28 22 - 32 mmol/L   Glucose, Bld 266 (H) 70 - 99 mg/dL   BUN 11 6 - 20 mg/dL   Creatinine, Ser 0.99 0.61 - 1.24 mg/dL   Calcium 9.1 8.9 - 10.3 mg/dL   Total Protein 7.1 6.5 - 8.1 g/dL   Albumin 3.6 3.5 - 5.0 g/dL   AST 20 15 - 41 U/L   ALT 18 0 - 44 U/L   Alkaline Phosphatase 74 38 - 126  U/L   Total Bilirubin 0.7 0.3 - 1.2 mg/dL   GFR calc non Af Amer >60 >60 mL/min   GFR calc Af Amer >60 >60 mL/min   Anion gap 9 5 - 15  Brain natriuretic peptide     Status: None   Collection Time: 07/14/19  4:52 AM  Result Value Ref Range   B Natriuretic Peptide 26.6 0.0 - 100.0 pg/mL  C-reactive protein     Status: Abnormal   Collection Time: 07/14/19  4:52 AM  Result Value Ref Range   CRP 4.7 (H) <1.0 mg/dL  Magnesium     Status: None   Collection Time: 07/14/19  4:52 AM  Result Value Ref Range   Magnesium 1.9 1.7 - 2.4 mg/dL  Glucose, capillary     Status: Abnormal   Collection Time: 07/14/19  6:36 AM  Result Value Ref Range   Glucose-Capillary 247 (H) 70 - 99 mg/dL    Assessment & Plan:  Patient was evaluated and treated and all questions answered.  Left Hallux Cellulitis, L leg cellulitis -Continued erythema noted, though slightly improved at the hallux. Still active cellulitis of the leg. -R/o DVT LLE - order DVT US -R/o Abscess left hallux - order MRI foot left -Labs reviewed. CRP and ESR elevated, though not high enough to suspect osteomyelitis -Left hallux fracture closed reduced  manually. Patient tolerated well. Held in place with betadine splint. -Will lift NPO and allow patient to eat lunch. Resume at 1300.  -If MRI suspects abscess would consider incision and drainage tonight -WBAT LLE in Surgical shoe.  Evelina Bucy, DPM  Accessible via secure chat for questions or concerns.

## 2019-07-14 NOTE — Progress Notes (Signed)
Lower extremity venous has been completed.   Preliminary results in CV Proc.   Abram Sander 07/14/2019 2:37 PM

## 2019-07-14 NOTE — Plan of Care (Signed)

## 2019-07-15 ENCOUNTER — Encounter (HOSPITAL_COMMUNITY): Payer: Self-pay | Admitting: *Deleted

## 2019-07-15 ENCOUNTER — Inpatient Hospital Stay (HOSPITAL_COMMUNITY): Payer: Medicaid Other

## 2019-07-15 DIAGNOSIS — E119 Type 2 diabetes mellitus without complications: Secondary | ICD-10-CM

## 2019-07-15 DIAGNOSIS — I10 Essential (primary) hypertension: Secondary | ICD-10-CM

## 2019-07-15 DIAGNOSIS — F1010 Alcohol abuse, uncomplicated: Secondary | ICD-10-CM

## 2019-07-15 DIAGNOSIS — S92412P Displaced fracture of proximal phalanx of left great toe, subsequent encounter for fracture with malunion: Secondary | ICD-10-CM

## 2019-07-15 DIAGNOSIS — L03032 Cellulitis of left toe: Secondary | ICD-10-CM

## 2019-07-15 LAB — GLUCOSE, CAPILLARY
Glucose-Capillary: 169 mg/dL — ABNORMAL HIGH (ref 70–99)
Glucose-Capillary: 173 mg/dL — ABNORMAL HIGH (ref 70–99)
Glucose-Capillary: 177 mg/dL — ABNORMAL HIGH (ref 70–99)
Glucose-Capillary: 197 mg/dL — ABNORMAL HIGH (ref 70–99)

## 2019-07-15 LAB — CBC WITH DIFFERENTIAL/PLATELET
Abs Immature Granulocytes: 0.03 10*3/uL (ref 0.00–0.07)
Basophils Absolute: 0.1 10*3/uL (ref 0.0–0.1)
Basophils Relative: 1 %
Eosinophils Absolute: 0.4 10*3/uL (ref 0.0–0.5)
Eosinophils Relative: 7 %
HCT: 39.2 % (ref 39.0–52.0)
Hemoglobin: 13.1 g/dL (ref 13.0–17.0)
Immature Granulocytes: 1 %
Lymphocytes Relative: 36 %
Lymphs Abs: 2 10*3/uL (ref 0.7–4.0)
MCH: 32.8 pg (ref 26.0–34.0)
MCHC: 33.4 g/dL (ref 30.0–36.0)
MCV: 98.2 fL (ref 80.0–100.0)
Monocytes Absolute: 0.4 10*3/uL (ref 0.1–1.0)
Monocytes Relative: 7 %
Neutro Abs: 2.6 10*3/uL (ref 1.7–7.7)
Neutrophils Relative %: 48 %
Platelets: 172 10*3/uL (ref 150–400)
RBC: 3.99 MIL/uL — ABNORMAL LOW (ref 4.22–5.81)
RDW: 12.9 % (ref 11.5–15.5)
WBC: 5.5 10*3/uL (ref 4.0–10.5)
nRBC: 0 % (ref 0.0–0.2)

## 2019-07-15 LAB — COMPREHENSIVE METABOLIC PANEL
ALT: 21 U/L (ref 0–44)
AST: 23 U/L (ref 15–41)
Albumin: 3.9 g/dL (ref 3.5–5.0)
Alkaline Phosphatase: 77 U/L (ref 38–126)
Anion gap: 12 (ref 5–15)
BUN: 8 mg/dL (ref 6–20)
CO2: 28 mmol/L (ref 22–32)
Calcium: 9.6 mg/dL (ref 8.9–10.3)
Chloride: 97 mmol/L — ABNORMAL LOW (ref 98–111)
Creatinine, Ser: 0.88 mg/dL (ref 0.61–1.24)
GFR calc Af Amer: >60 mL/min (ref >60)
GFR calc non Af Amer: >60 mL/min (ref >60)
Glucose, Bld: 164 mg/dL — ABNORMAL HIGH (ref 70–99)
Potassium: 4.1 mmol/L (ref 3.5–5.1)
Sodium: 137 mmol/L (ref 135–145)
Total Bilirubin: 0.9 mg/dL (ref 0.3–1.2)
Total Protein: 7.7 g/dL (ref 6.5–8.1)

## 2019-07-15 LAB — MAGNESIUM: Magnesium: 2 mg/dL (ref 1.7–2.4)

## 2019-07-15 LAB — C-REACTIVE PROTEIN: CRP: 3 mg/dL — ABNORMAL HIGH (ref <1.0)

## 2019-07-15 LAB — BRAIN NATRIURETIC PEPTIDE: B Natriuretic Peptide: 130.1 pg/mL — ABNORMAL HIGH (ref 0.0–100.0)

## 2019-07-15 MED ORDER — DEXTROSE-NACL 5-0.45 % IV SOLN
INTRAVENOUS | Status: AC
  Administered 2019-07-15: via INTRAVENOUS

## 2019-07-15 MED ORDER — GADOBUTROL 1 MMOL/ML IV SOLN
10.0000 mL | Freq: Once | INTRAVENOUS | Status: AC | PRN
  Administered 2019-07-15: 10 mL via INTRAVENOUS

## 2019-07-15 NOTE — Care Management (Signed)
CM following for DCP needs for dispositional needs. Awaiting surgical decision, then PT/OT eval post-op for DCP recommendations. Will  continue to follow.  Midge Minium RN, BSN, NCM-BC, ACM-RN [phone]

## 2019-07-15 NOTE — Progress Notes (Signed)
PROGRESS NOTE                                                                                                                                                                                                             Patient Demographics:    Eugene Diaz, is a 53 y.o. male, DOB - Apr 23, 1966, [id number]  Admit date - 07/12/2019   Admitting Physician Mauricio Gerome Apley, MD  Outpatient Primary MD for the patient is Redmond School, MD  LOS - 3  CC - left toe infection.     Brief Narrative  Eugene Diaz is a 54 y.o. male with medical history significant of anxiety, arthritis, asthma, depression, type 2 diabetes, hypertension, hyperlipidemia, neuropathy, peripheral vascular disease presenting to the hospital as a direct admit from podiatry for left great toe fracture and left lower extremity cellulitis.  Patient states about a week ago he was playing with his grandkids and while running bumped his left great toe into a railroad track.     Subjective:   Patient in bed, appears comfortable, denies any headache, no fever, no chest pain or pressure, no shortness of breath , no abdominal pain. No focal weakness.   Assessment  & Plan :     1.  Left big toe, foot and leg cellulitis big toe fracture. Seen by podiatry, agree with IV vancomycin and Zosyn combination along with supportive care for pain control.  [name] & Vas [place] both stable.  Podiatry following still await MRI L foot, currently has regular shoe available when ambulating, has been kept n.p.o. overnight, podiatry to evaluate for any surgical intervention today.  Baseline EKG and chest x-ray reviewed both unremarkable.  2.  Alcohol use.  Counseled to quit.  Placed on Librium along with CIWA protocol.  Currently no signs of DTs.  3.  Hypertension.  Placed on low-dose Coreg along with PRN hydralazine.  Blood pressure currently stable.  4.  DM type II.  A1c 6.6, currently on Lantus along with sliding scale for good  control.  CBGs high due to stress.  CBG (last 3)  Recent Labs    07/14/19 1806 07/14/19 2113 07/15/19 0630  GLUCAP 243* 215* 169*     Family Communication  :  None  Code Status :  Full  Disposition Plan  :  Inpt  Consults  :  Podiatry  Procedures  :    [name] & Vas [place] - stable.  MRI L Foot  -   DVT Prophylaxis  :  Heparin ordered    Lab Results  Component Value Date   PLT 172 07/15/2019    Diet :  Diet Order            Diet NPO time specified Except for: Sips with Meds  Diet effective midnight        Diet Carb Modified Fluid consistency: Thin; Room service appropriate? Yes  Diet effective now               Inpatient Medications Scheduled Meds:  carvedilol  3.125 mg Oral BID WC   chlordiazePOXIDE  15 mg Oral TID   folic acid  1 mg Oral Daily   heparin injection (subcutaneous)  5,000 Units Subcutaneous Q8H   insulin aspart  0-9 Units Subcutaneous TID WC   insulin glargine  20 Units Subcutaneous Daily   multivitamin with minerals  1 tablet Oral Daily   thiamine  100 mg Oral Daily   Or   thiamine  100 mg Intravenous Daily   Continuous Infusions:  [START ON 07/16/2019] dextrose 5 % and 0.45% NaCl     piperacillin-tazobactam (ZOSYN)  IV 3.375 g (07/15/19 0451)   vancomycin Stopped (07/14/19 2324)   PRN Meds:.acetaminophen **OR** acetaminophen, hydrALAZINE, LORazepam **OR** LORazepam  Antibiotics  :   Anti-infectives (From admission, onward)   Start     Dose/Rate Route Frequency Ordered Stop   07/13/19 1000  vancomycin (VANCOCIN) 1,500 mg in sodium chloride 0.9 % 500 mL IVPB     1,500 mg 250 mL/hr over 120 Minutes Intravenous Every 12 hours 07/13/19 0210     07/13/19 0030  vancomycin (VANCOCIN) 2,000 mg in sodium chloride 0.9 % 500 mL IVPB     2,000 mg 250 mL/hr over 120 Minutes Intravenous  Once 07/13/19 0009 07/13/19 0309   07/13/19 0015  piperacillin-tazobactam (ZOSYN) IVPB 3.375 g  Status:  Discontinued     3.375 g 100 mL/hr over 30  Minutes Intravenous  Once 07/13/19 0006 07/13/19 0009   07/13/19 0015  vancomycin (VANCOCIN) IVPB 1000 mg/200 mL premix  Status:  Discontinued     1,000 mg 200 mL/hr over 60 Minutes Intravenous  Once 07/13/19 0006 07/13/19 0009   07/13/19 0015  piperacillin-tazobactam (ZOSYN) IVPB 3.375 g     3.375 g 12.5 mL/hr over 240 Minutes Intravenous Every 8 hours 07/13/19 0009            Objective:   Vitals:   07/14/19 0737 07/14/19 1810 07/14/19 1934 07/15/19 0446  BP: 134/78 135/87 (!) 118/102 125/88  Pulse: 72 70 78 88  Resp: 14 16 18    Temp: 97.8 F (36.6 C) 97.8 F (36.6 C) 98.2 F (36.8 C)   TempSrc: Oral Oral Oral   SpO2: 100% 97% 99% 94%  Weight:      Height:        Wt Readings from Last 3 Encounters:  07/13/19 130.2 kg  06/14/19 130.2 kg  12/19/18 133.8 kg     Intake/Output Summary (Last 24 hours) at 07/15/2019 1102 Last data filed at 07/14/2019 1700 Gross per 24 hour  Intake 480 ml  Output --  Net 480 ml     Physical Exam  Awake Alert, Oriented X 3, No new F.N deficits, Normal affect [place].AT,PERRAL Supple Neck,No JVD, No cervical lymphadenopathy appriciated.  Symmetrical Chest wall movement, Good air movement bilaterally, CTAB RRR,No Gallops, Rubs or new Murmurs, No Parasternal Heave +ve B.Sounds, Abd Soft, No tenderness, No organomegaly appriciated, No rebound - guarding or rigidity. No Cyanosis,  left great toe cellulitis with some surrounding cellulitis in the left foot and below-knee leg as well.          Data Review:    CBC Recent Labs  Lab 07/12/19 2356 07/14/19 0452 07/15/19 0424  WBC 7.4 4.5 5.5  HGB 13.8 12.6* 13.1  HCT 39.5 37.4* 39.2  PLT 161 149* 172  MCV 94.3 98.2 98.2  MCH 32.9 33.1 32.8  MCHC 34.9 33.7 33.4  RDW 12.6 13.1 12.9  LYMPHSABS  --  1.7 2.0  MONOABS  --  0.3 0.4  EOSABS  --  0.3 0.4  BASOSABS  --  0.0 0.1    Chemistries  Recent Labs  Lab 07/12/19 2356 07/14/19 0452 07/15/19 0424  NA 131* 133* 137  K  4.5 4.5 4.1  CL 95* 96* 97*  CO2 26 28 28   GLUCOSE 173* 266* 164*  BUN 19 11 8   CREATININE 0.99 0.99 0.88  CALCIUM 9.3 9.1 9.6  MG 1.7 1.9 2.0  AST  --  20 23  ALT  --  18 21  ALKPHOS  --  74 77  BILITOT  --  0.7 0.9   ------------------------------------------------------------------------------------------------------------------ No results for input(s): CHOL, HDL, LDLCALC, TRIG, CHOLHDL, LDLDIRECT in the last 72 hours.  Lab Results  Component Value Date   HGBA1C 6.6 (H) 07/12/2019   ------------------------------------------------------------------------------------------------------------------ No results for input(s): TSH, T4TOTAL, T3FREE, THYROIDAB in the last 72 hours.  Invalid input(s): FREET3 ------------------------------------------------------------------------------------------------------------------ No results for input(s): VITAMINB12, FOLATE, FERRITIN, TIBC, IRON, RETICCTPCT in the last 72 hours.  Coagulation profile No results for input(s): INR, PROTIME in the last 168 hours.  No results for input(s): DDIMER in the last 72 hours.  Cardiac Enzymes No results for input(s): CKMB, TROPONINI, MYOGLOBIN in the last 168 hours.  Invalid input(s): CK ------------------------------------------------------------------------------------------------------------------    Component Value Date/Time   BNP 130.1 (H) 07/15/2019 0424    Micro Results Recent Results (from the past 240 hour(s))  Surgical pcr screen     Status: None   Collection Time: 07/13/19 12:08 AM   Specimen: Nasal Mucosa; Nasal Swab  Result Value Ref Range Status   MRSA, PCR NEGATIVE NEGATIVE Final   Staphylococcus aureus NEGATIVE NEGATIVE Final    Comment: (NOTE) The Xpert SA Assay (FDA approved for NASAL specimens in patients 62 years of age and older), is one component of a comprehensive surveillance program. It is not intended to diagnose infection nor to guide or monitor  treatment. Performed at [facility], [place] [address], [place], [place] [address]   SARS CORONAVIRUS 2 (TAT 6-24 HRS) Nasopharyngeal Nasopharyngeal Swab     Status: None   Collection Time: 07/13/19  4:20 AM   Specimen: Nasopharyngeal Swab  Result Value Ref Range Status   SARS Coronavirus 2 NEGATIVE NEGATIVE Final    Comment: (NOTE) SARS-CoV-2 target nucleic acids are NOT DETECTED. The SARS-CoV-2 RNA is generally detectable in upper and lower respiratory specimens during the acute phase of infection. Negative results do not preclude SARS-CoV-2 infection, do not rule out co-infections with other pathogens, and should not be used as the sole basis for treatment or other patient management decisions. Negative results must be combined with clinical observations, patient history, and epidemiological information. The expected result is Negative. Fact Sheet for Patients: SugarRoll.be Fact Sheet for Healthcare Providers: https://www.woods-mathews.com/ This test is not yet approved or cleared by the [place] FDA and  has been authorized for detection and/or diagnosis of SARS-CoV-2 by FDA under an Emergency  Use Authorization (EUA). This EUA will remain  in effect (meaning this test can be used) for the duration of the COVID-19 declaration under Section 56 4(b)(1) of the Act, 21 U.S.C. section 360bbb-3(b)(1), unless the authorization is terminated or revoked sooner. Performed at [facility], [place] [address] [place], [place] [address]     Radiology Reports Dg Chest Port 1 View  Result Date: 07/13/2019 CLINICAL DATA:  Cellulitis.  Pre-op respiratory exam EXAM: PORTABLE CHEST 1 VIEW COMPARISON:  10/31/2018 FINDINGS: Stable borderline cardiomegaly. Both lungs are clear. No evidence of pneumothorax or pleural effusion. IMPRESSION: Stable borderline cardiomegaly. No active lung disease. Electronically Signed   By: Marlaine Hind M.D.   On:  07/13/2019 15:09   Dg Foot Complete Left  Result Date: 07/12/2019 Please see detailed radiograph report in office note.  Vas [place] Burnard Bunting With/wo Tbi  Result Date: 07/13/2019 LOWER EXTREMITY DOPPLER STUDY Indications: Great toe broken, cellulitis. High Risk Factors: Hypertension, hyperlipidemia, Diabetes. Other Factors: ETOH abuse.  Limitations: Today's exam was limited due to Left toe too swollen for toe cuff. Comparison Study: No prior study on file for comparison Performing Technologist: Sharion Dove RVS  Examination Guidelines: A complete evaluation includes at minimum, Doppler waveform signals and systolic blood pressure reading at the level of bilateral brachial, anterior tibial, and posterior tibial arteries, when vessel segments are accessible. Bilateral testing is considered an integral part of a complete examination. Photoelectric Plethysmograph (PPG) waveforms and toe systolic pressure readings are included as required and additional duplex testing as needed. Limited examinations for reoccurring indications may be performed as noted.  [name] Findings: +---------+------------------+-----+---------+--------+  Right     Rt Pressure (mmHg) Index Waveform  Comment   +---------+------------------+-----+---------+--------+  Brachial  147                      triphasic           +---------+------------------+-----+---------+--------+  PTA       176                1.20  biphasic            +---------+------------------+-----+---------+--------+  DP        175                1.19  biphasic            +---------+------------------+-----+---------+--------+  Great Toe 108                0.73                      +---------+------------------+-----+---------+--------+ +---------+------------------+-----+---------+----------------------------+  Left      Lt Pressure (mmHg) Index Waveform  Comment                       +---------+------------------+-----+---------+----------------------------+  Brachial  144                       triphasic                               +---------+------------------+-----+---------+----------------------------+  PTA       147                1.00  biphasic                                +---------+------------------+-----+---------+----------------------------+  DP        150                1.02  biphasic                                +---------+------------------+-----+---------+----------------------------+  Great Toe                                    Toe too swollen for toe cuff  +---------+------------------+-----+---------+----------------------------+ +-------+-----------+--------------+------------+------------+  [name]/TBI Today's [name] Today's TBI    Previous [name] Previous TBI  +-------+-----------+--------------+------------+------------+  Right   1.2         0.73                                      +-------+-----------+--------------+------------+------------+  Left    1.02        cuff too small                            +-------+-----------+--------------+------------+------------+  Summary: Right: Resting right ankle-brachial index is within normal range. No evidence of significant right lower extremity arterial disease. The right toe-brachial index is normal. Left: Resting left ankle-brachial index is within normal range. No evidence of significant left lower extremity arterial disease.  *See table(s) above for measurements and observations.  Electronically signed by Servando Snare MD on 07/13/2019 at 12:54:43 PM.    Final    Vas [place] Lower Extremity Venous (dvt)  Result Date: 07/14/2019  Lower Venous Study Indications: Edema.  Comparison Study: no prior Performing Technologist: Abram Sander RVS  Examination Guidelines: A complete evaluation includes B-mode imaging, spectral Doppler, color Doppler, and power Doppler as needed of all accessible portions of each vessel. Bilateral testing is considered an integral part of a complete examination. Limited examinations for reoccurring  indications may be performed as noted.  +-----+---------------+---------+-----------+----------+--------------+  RIGHT Compressibility Phasicity Spontaneity Properties Thrombus Aging  +-----+---------------+---------+-----------+----------+--------------+  CFV   Full            Yes       Yes                                    +-----+---------------+---------+-----------+----------+--------------+   +---------+---------------+---------+-----------+----------+--------------+  LEFT      Compressibility Phasicity Spontaneity Properties Thrombus Aging  +---------+---------------+---------+-----------+----------+--------------+  CFV       Full            Yes       Yes                                    +---------+---------------+---------+-----------+----------+--------------+  SFJ       Full                                                             +---------+---------------+---------+-----------+----------+--------------+  FV Prox   Full                                                             +---------+---------------+---------+-----------+----------+--------------+  FV Mid    Full                                                             +---------+---------------+---------+-----------+----------+--------------+  FV Distal Full                                                             +---------+---------------+---------+-----------+----------+--------------+  PFV       Full                                                             +---------+---------------+---------+-----------+----------+--------------+  POP       Full            Yes       Yes                                    +---------+---------------+---------+-----------+----------+--------------+  PTV       Full                                                             +---------+---------------+---------+-----------+----------+--------------+  PERO                                                       Not visualized   +---------+---------------+---------+-----------+----------+--------------+     Summary: Right: No evidence of common femoral vein obstruction. Left: There is no evidence of deep vein thrombosis in the lower extremity. No cystic structure found in the popliteal fossa.  *See table(s) above for measurements and observations. Electronically signed by Servando Snare MD on 07/14/2019 at 7:43:53 PM.    Final     Time Spent in minutes  30   Lala Lund M.D on 07/15/2019 at 11:02 AM  To page go to [email or web address] - password [email or web address]

## 2019-07-15 NOTE — Progress Notes (Signed)
Physical Therapy Treatment Patient Details Name: Eugene Diaz MRN: [id number] DOB: Jul 31, 1966 Today's Date: 07/15/2019    History of Present Illness Eugene Diaz is a 53 y.o. male with medical history significant of anxiety, arthritis, asthma, depression, type 2 diabetes, hypertension, hyperlipidemia, neuropathy, peripheral vascular disease presenting to the hospital as a direct admit from podiatry for left great toe fracture and left lower extremity cellulitis.    PT Comments    Patient seen for mobility progression. Pt overall is mod I/supervision for mobility this session. Pt educated to make sure he is wearing shoes while up. Pt was sitting on couch in room with bare feet on floor upon arrival. PT will continue to follow acutely and progress as tolerated.     Follow Up Recommendations  No PT follow up (may change if pt has surgery on L foot)     Equipment Recommendations  Rolling walker with 5" wheels(bariatric width)    Recommendations for Other Services       Precautions / Restrictions Precautions Precautions: Fall Required Braces or Orthoses: Other Brace Other Brace: L post op shoe Restrictions Weight Bearing Restrictions: No    Mobility  Bed Mobility               General bed mobility comments: pt sitting on couch upon arrival  Transfers Overall transfer level: Modified independent Equipment used: Rolling walker (2 wheeled) Transfers: Sit to/from Stand              Ambulation/Gait Ambulation/Gait assistance: Supervision;Modified independent (Device/Increase time) Gait Distance (Feet): 200 Feet Assistive device: Rolling walker (2 wheeled)(wide) Gait Pattern/deviations: Wide base of support;Step-through pattern;Trunk flexed Gait velocity: WFL   General Gait Details: cues for upright posture and proximity to RW; supervision without AD and mod I with RW    Stairs Stairs: Yes Stairs assistance: Supervision Stair Management: Two rails;Step to  pattern;Forwards Number of Stairs: (2 steps X 2 trials)     Wheelchair Mobility    Modified Rankin (Stroke Patients Only)       Balance Overall balance assessment: Mild deficits observed, not formally tested                                          Cognition Arousal/Alertness: Awake/alert Behavior During Therapy: WFL for tasks assessed/performed Overall Cognitive Status: Within Functional Limits for tasks assessed                                 General Comments: decreased safety awareness but feel this is baseline      Exercises      General Comments        Pertinent Vitals/Pain Pain Assessment: No/denies pain(decreased sensation )    Home Living                      Prior Function            PT Goals (current goals can now be found in the care plan section) Acute Rehab PT Goals Patient Stated Goal: return home Progress towards PT goals: Progressing toward goals    Frequency    Min 3X/week      PT Plan Current plan remains appropriate    Co-evaluation              AM-PAC PT "6 Clicks"  Mobility   Outcome Measure  Help needed turning from your back to your side while in a flat bed without using bedrails?: None Help needed moving from lying on your back to sitting on the side of a flat bed without using bedrails?: None Help needed moving to and from a bed to a chair (including a wheelchair)?: None Help needed standing up from a chair using your arms (e.g., wheelchair or bedside chair)?: None Help needed to walk in hospital room?: None Help needed climbing 3-5 steps with a railing? : A Little 6 Click Score: 23    End of Session Equipment Utilized During Treatment: Gait belt Activity Tolerance: Patient tolerated treatment well Patient left: in chair;with call bell/phone within reach Nurse Communication: Mobility status PT Visit Diagnosis: Unsteadiness on feet (R26.81)     Time: [id number] PT Time  Calculation (min) (ACUTE ONLY): 29 min  Charges:  $Gait Training: 23-37 mins                     Earney Navy, PTA Acute Rehabilitation Services Pager: [phone] Office: [phone]     Darliss Cheney 07/15/2019, 1:22 PM

## 2019-07-16 ENCOUNTER — Encounter (HOSPITAL_COMMUNITY): Payer: Self-pay | Admitting: Certified Registered"

## 2019-07-16 ENCOUNTER — Inpatient Hospital Stay (HOSPITAL_COMMUNITY): Payer: Medicaid Other | Admitting: Anesthesiology

## 2019-07-16 ENCOUNTER — Encounter (HOSPITAL_COMMUNITY)
Admission: AD | Disposition: A | Discharge status: Home Health | Payer: Self-pay | Source: Ambulatory Visit | Attending: Internal Medicine

## 2019-07-16 HISTORY — PX: I & D EXTREMITY: SHX5045

## 2019-07-16 HISTORY — PX: PERCUTANEOUS PINNING: SHX2209

## 2019-07-16 LAB — CBC WITH DIFFERENTIAL/PLATELET
Abs Immature Granulocytes: 0.02 10*3/uL (ref 0.00–0.07)
Basophils Absolute: 0 10*3/uL (ref 0.0–0.1)
Basophils Relative: 1 %
Eosinophils Absolute: 0.3 10*3/uL (ref 0.0–0.5)
Eosinophils Relative: 8 %
HCT: 33.5 % — ABNORMAL LOW (ref 39.0–52.0)
Hemoglobin: 11.2 g/dL — ABNORMAL LOW (ref 13.0–17.0)
Immature Granulocytes: 1 %
Lymphocytes Relative: 36 %
Lymphs Abs: 1.4 10*3/uL (ref 0.7–4.0)
MCH: 32.5 pg (ref 26.0–34.0)
MCHC: 33.4 g/dL (ref 30.0–36.0)
MCV: 97.1 fL (ref 80.0–100.0)
Monocytes Absolute: 0.4 10*3/uL (ref 0.1–1.0)
Monocytes Relative: 10 %
Neutro Abs: 1.8 10*3/uL (ref 1.7–7.7)
Neutrophils Relative %: 44 %
Platelets: 138 10*3/uL — ABNORMAL LOW (ref 150–400)
RBC: 3.45 MIL/uL — ABNORMAL LOW (ref 4.22–5.81)
RDW: 12.8 % (ref 11.5–15.5)
WBC: 4 10*3/uL (ref 4.0–10.5)
nRBC: 0 % (ref 0.0–0.2)

## 2019-07-16 LAB — COMPREHENSIVE METABOLIC PANEL
ALT: 19 U/L (ref 0–44)
AST: 21 U/L (ref 15–41)
Albumin: 3.2 g/dL — ABNORMAL LOW (ref 3.5–5.0)
Alkaline Phosphatase: 67 U/L (ref 38–126)
Anion gap: 10 (ref 5–15)
BUN: 7 mg/dL (ref 6–20)
CO2: 26 mmol/L (ref 22–32)
Calcium: 8.7 mg/dL — ABNORMAL LOW (ref 8.9–10.3)
Chloride: 99 mmol/L (ref 98–111)
Creatinine, Ser: 0.89 mg/dL (ref 0.61–1.24)
GFR calc Af Amer: >60 mL/min (ref >60)
GFR calc non Af Amer: >60 mL/min (ref >60)
Glucose, Bld: 226 mg/dL — ABNORMAL HIGH (ref 70–99)
Potassium: 3.9 mmol/L (ref 3.5–5.1)
Sodium: 135 mmol/L (ref 135–145)
Total Bilirubin: 0.8 mg/dL (ref 0.3–1.2)
Total Protein: 6.7 g/dL (ref 6.5–8.1)

## 2019-07-16 LAB — GLUCOSE, CAPILLARY
Glucose-Capillary: 176 mg/dL — ABNORMAL HIGH (ref 70–99)
Glucose-Capillary: 143 mg/dL — ABNORMAL HIGH (ref 70–99)
Glucose-Capillary: 108 mg/dL — ABNORMAL HIGH (ref 70–99)
Glucose-Capillary: 100 mg/dL — ABNORMAL HIGH (ref 70–99)
Glucose-Capillary: 187 mg/dL — ABNORMAL HIGH (ref 70–99)

## 2019-07-16 LAB — BRAIN NATRIURETIC PEPTIDE: B Natriuretic Peptide: 77.2 pg/mL (ref 0.0–100.0)

## 2019-07-16 LAB — MAGNESIUM: Magnesium: 1.8 mg/dL (ref 1.7–2.4)

## 2019-07-16 LAB — C-REACTIVE PROTEIN: CRP: 3.1 mg/dL — ABNORMAL HIGH (ref <1.0)

## 2019-07-16 SURGERY — IRRIGATION AND DEBRIDEMENT EXTREMITY
Anesthesia: General | Site: First Toe | Laterality: Left

## 2019-07-16 MED ORDER — PROPOFOL 10 MG/ML IV BOLUS
INTRAVENOUS | Status: AC
  Filled 2019-07-16: qty 20

## 2019-07-16 MED ORDER — ONDANSETRON HCL 4 MG/2ML IJ SOLN
INTRAMUSCULAR | Status: AC
  Filled 2019-07-16: qty 2

## 2019-07-16 MED ORDER — BUPIVACAINE HCL (PF) 0.5 % IJ SOLN
INTRAMUSCULAR | Status: DC | PRN
  Administered 2019-07-16: 10 mL

## 2019-07-16 MED ORDER — VANCOMYCIN HCL 1000 MG IV SOLR
INTRAVENOUS | Status: AC
  Filled 2019-07-16: qty 1000

## 2019-07-16 MED ORDER — LIDOCAINE 2% (20 MG/ML) 5 ML SYRINGE
INTRAMUSCULAR | Status: AC
  Filled 2019-07-16: qty 5

## 2019-07-16 MED ORDER — 0.9 % SODIUM CHLORIDE (POUR BTL) OPTIME
TOPICAL | Status: DC | PRN
  Administered 2019-07-16: 1000 mL

## 2019-07-16 MED ORDER — MIDAZOLAM HCL 2 MG/2ML IJ SOLN
INTRAMUSCULAR | Status: AC
  Filled 2019-07-16: qty 2

## 2019-07-16 MED ORDER — SODIUM CHLORIDE 0.9 % IR SOLN
Status: DC | PRN
  Administered 2019-07-16: 1000 mL

## 2019-07-16 MED ORDER — PROPOFOL 10 MG/ML IV BOLUS
INTRAVENOUS | Status: DC | PRN
  Administered 2019-07-16: 50 mg via INTRAVENOUS
  Administered 2019-07-16: 150 mg via INTRAVENOUS

## 2019-07-16 MED ORDER — DEXTROSE-NACL 5-0.45 % IV SOLN: INTRAVENOUS | Status: AC

## 2019-07-16 MED ORDER — LIDOCAINE 2% (20 MG/ML) 5 ML SYRINGE
INTRAMUSCULAR | Status: DC | PRN
  Administered 2019-07-16: 60 mg via INTRAVENOUS

## 2019-07-16 MED ORDER — FENTANYL CITRATE (PF) 250 MCG/5ML IJ SOLN
INTRAMUSCULAR | Status: AC
  Filled 2019-07-16: qty 5

## 2019-07-16 MED ORDER — VANCOMYCIN HCL 1000 MG IV SOLR
INTRAVENOUS | Status: DC | PRN
  Administered 2019-07-16: 1000 mg via TOPICAL

## 2019-07-16 MED ORDER — DEXAMETHASONE SODIUM PHOSPHATE 10 MG/ML IJ SOLN
INTRAMUSCULAR | Status: AC
  Filled 2019-07-16: qty 1

## 2019-07-16 MED ORDER — BUPIVACAINE HCL (PF) 0.5 % IJ SOLN
INTRAMUSCULAR | Status: AC
  Filled 2019-07-16: qty 30

## 2019-07-16 MED ORDER — LACTATED RINGERS IV SOLN
INTRAVENOUS | Status: DC
  Administered 2019-07-16: 18:00:00 via INTRAVENOUS

## 2019-07-16 SURGICAL SUPPLY — 56 items
APL PRP STRL LF DISP 70% ISPRP (MISCELLANEOUS) ×2
BNDG CMPR 75X41 PLY ABS (GAUZE/BANDAGES/DRESSINGS) ×2
BNDG ELASTIC 2X5.8 VLCR STR LF (GAUZE/BANDAGES/DRESSINGS) ×1 IMPLANT
BNDG ELASTIC 4X5.8 VLCR STR LF (GAUZE/BANDAGES/DRESSINGS) IMPLANT
BNDG GAUZE ELAST 4 BULKY (GAUZE/BANDAGES/DRESSINGS) IMPLANT
BNDG STRETCH 4X75 NS LF (GAUZE/BANDAGES/DRESSINGS) ×1 IMPLANT
CHLORAPREP W/TINT 26 (MISCELLANEOUS) ×3 IMPLANT
COVER SURGICAL LIGHT HANDLE (MISCELLANEOUS) ×3 IMPLANT
COVER WAND RF STERILE (DRAPES) ×3 IMPLANT
CUFF TOURN SGL QUICK 18X4 (TOURNIQUET CUFF) ×1 IMPLANT
CUFF TOURN SGL QUICK 34 (TOURNIQUET CUFF)
CUFF TRNQT CYL 34X4.125X (TOURNIQUET CUFF) IMPLANT
DRAPE C-ARM 42X72 X-RAY (DRAPES) ×1 IMPLANT
DRAPE U-SHAPE 47X51 STRL (DRAPES) ×3 IMPLANT
ELECT CAUTERY BLADE 6.4 (BLADE) ×3 IMPLANT
ELECT REM PT RETURN 9FT ADLT (ELECTROSURGICAL) ×3
ELECTRODE REM PT RTRN 9FT ADLT (ELECTROSURGICAL) ×2 IMPLANT
GAUZE SPONGE 2X2 8PLY STRL LF (GAUZE/BANDAGES/DRESSINGS) IMPLANT
GAUZE SPONGE 4X4 12PLY STRL (GAUZE/BANDAGES/DRESSINGS) IMPLANT
GAUZE SPONGE 4X4 16PLY XRAY LF (GAUZE/BANDAGES/DRESSINGS) ×1 IMPLANT
GLOVE BIO SURGEON STRL SZ7.5 (GLOVE) ×3 IMPLANT
GLOVE BIOGEL PI IND STRL 8 (GLOVE) ×2 IMPLANT
GLOVE BIOGEL PI INDICATOR 8 (GLOVE) ×1
GOWN STRL REUS W/ TWL LRG LVL3 (GOWN DISPOSABLE) ×2 IMPLANT
GOWN STRL REUS W/ TWL XL LVL3 (GOWN DISPOSABLE) ×2 IMPLANT
GOWN STRL REUS W/TWL LRG LVL3 (GOWN DISPOSABLE) ×3
GOWN STRL REUS W/TWL XL LVL3 (GOWN DISPOSABLE) ×3
HYDROGEN PEROXIDE 16OZ (MISCELLANEOUS) ×1 IMPLANT
K-WIRE .062 (WIRE) ×6
K-WIRE FX6X.062X2 END TROC (WIRE) ×4
KIT BASIN OR (CUSTOM PROCEDURE TRAY) ×3 IMPLANT
KIT TURNOVER KIT B (KITS) ×3 IMPLANT
KWIRE FX6X.062X2 END TROC (WIRE) IMPLANT
MANIFOLD NEPTUNE II (INSTRUMENTS) ×3 IMPLANT
NDL BIOPSY JAMSHIDI 8X6 (NEEDLE) IMPLANT
NDL HYPO 25GX1X1/2 BEV (NEEDLE) IMPLANT
NEEDLE BIOPSY JAMSHIDI 8X6 (NEEDLE) IMPLANT
NEEDLE HYPO 25GX1X1/2 BEV (NEEDLE) ×3 IMPLANT
NS IRRIG 1000ML POUR BTL (IV SOLUTION) ×3 IMPLANT
PACK ORTHO EXTREMITY (CUSTOM PROCEDURE TRAY) ×3 IMPLANT
PAD ARMBOARD 7.5X6 YLW CONV (MISCELLANEOUS) ×6 IMPLANT
PROBE DEBRIDE SONICVAC MISONIX (TIP) IMPLANT
SET CYSTO W/LG BORE CLAMP LF (SET/KITS/TRAYS/PACK) ×3 IMPLANT
SOL PREP POV-IOD 4OZ 10% (MISCELLANEOUS) ×7 IMPLANT
SPONGE GAUZE 2X2 STER 10/PKG (GAUZE/BANDAGES/DRESSINGS) ×1
STAPLER VISISTAT 35W (STAPLE) ×1 IMPLANT
SUT ETHILON 4 0 PS 2 18 (SUTURE) ×1 IMPLANT
SUT MNCRL AB 4-0 PS2 18 (SUTURE) ×1 IMPLANT
SWAB COLLECTION DEVICE MRSA (MISCELLANEOUS) ×2 IMPLANT
SWAB CULTURE ESWAB REG 1ML (MISCELLANEOUS) ×2 IMPLANT
SYR CONTROL 10ML LL (SYRINGE) IMPLANT
TAPE PAPER MEDFIX 1IN X 10YD (GAUZE/BANDAGES/DRESSINGS) ×1 IMPLANT
TOWEL GREEN STERILE (TOWEL DISPOSABLE) ×3 IMPLANT
TOWEL GREEN STERILE FF (TOWEL DISPOSABLE) ×3 IMPLANT
TUBE CONNECTING 12X1/4 (SUCTIONS) ×3 IMPLANT
YANKAUER SUCT BULB TIP NO VENT (SUCTIONS) ×3 IMPLANT

## 2019-07-16 NOTE — Progress Notes (Signed)
PROGRESS NOTE                                                                                                                                                                                                             Patient Demographics:    Eugene Diaz, is a 53 y.o. male, DOB - [birth year], [id number]  Admit date - 07/12/2019   Admitting Physician Mauricio Gerome Apley, MD  Outpatient Primary MD for the patient is Redmond School, MD  LOS - 4  CC - left toe infection.     Brief Narrative  Eugene Diaz is a 53 y.o. male with medical history significant of anxiety, arthritis, asthma, depression, type 2 diabetes, hypertension, hyperlipidemia, neuropathy, peripheral vascular disease presenting to the hospital as a direct admit from podiatry for left great toe fracture and left lower extremity cellulitis.  Patient states about a week ago he was playing with his grandkids and while running bumped his left great toe into a railroad track.     Subjective:   Patient in bed, appears comfortable, denies any headache, no fever, no chest pain or pressure, no shortness of breath , no abdominal pain. No focal weakness.   Assessment  & Plan :     1.  Left big toe, foot and leg cellulitis big toe fracture. Seen by podiatry, agree with IV vancomycin and Zosyn combination along with supportive care for pain control.  [name] & Vas [place] both stable.  Podiatry following Case discussed with [name] on 07/16/2019, MRI of the left foot noted with no osteomyelitis or abscess, he is going to OR on 07/16/2019 for bone biopsy for now continue IV antibiotics.  2.  Alcohol use.  Counseled to quit.  Placed on Librium along with CIWA protocol.  Currently no signs of DTs.  3.  Hypertension.  Placed on low-dose Coreg along with PRN hydralazine.  Blood pressure currently stable.  4.  DM type II.  A1c 6.6, currently on Lantus along with sliding scale for good control.  CBGs high due to stress.  CBG  (last 3)  Recent Labs    07/15/19 1636 07/15/19 2133 07/16/19 0636  GLUCAP 177* 197* 176*     Family Communication  :  None  Code Status :  Full  Disposition Plan  :  Inpt  Consults  :  Podiatry  Procedures  :    [name] & Vas [place] - stable.  MRI L Foot  -   1. Soft tissue swelling  and enhancement involving the great toe and dorsal forefoot, consistent with cellulitis. No evidence of osteomyelitis. No abscess. 2. Unchanged comminuted fracture of the first proximal phalanx.   DVT Prophylaxis  :   Heparin ordered    Lab Results  Component Value Date   PLT 138 (L) 07/16/2019    Diet :  Diet Order            Diet NPO time specified Except for: Sips with Meds  Diet effective midnight               Inpatient Medications Scheduled Meds:  carvedilol  3.125 mg Oral BID WC   chlordiazePOXIDE  15 mg Oral TID   folic acid  1 mg Oral Daily   heparin injection (subcutaneous)  5,000 Units Subcutaneous Q8H   insulin aspart  0-9 Units Subcutaneous TID WC   insulin glargine  20 Units Subcutaneous Daily   multivitamin with minerals  1 tablet Oral Daily   thiamine  100 mg Oral Daily   Or   thiamine  100 mg Intravenous Daily   Continuous Infusions:  dextrose 5 % and 0.45% NaCl 50 mL/hr at 07/16/19 0340   piperacillin-tazobactam (ZOSYN)  IV 3.375 g (07/16/19 0649)   vancomycin 1,500 mg (07/16/19 0957)   PRN Meds:.acetaminophen **OR** acetaminophen, hydrALAZINE  Antibiotics  :   Anti-infectives (From admission, onward)   Start     Dose/Rate Route Frequency Ordered Stop   07/13/19 1000  vancomycin (VANCOCIN) 1,500 mg in sodium chloride 0.9 % 500 mL IVPB     1,500 mg 250 mL/hr over 120 Minutes Intravenous Every 12 hours 07/13/19 0210     07/13/19 0030  vancomycin (VANCOCIN) 2,000 mg in sodium chloride 0.9 % 500 mL IVPB     2,000 mg 250 mL/hr over 120 Minutes Intravenous  Once 07/13/19 0009 07/13/19 0309   07/13/19 0015  piperacillin-tazobactam (ZOSYN) IVPB 3.375 g   Status:  Discontinued     3.375 g 100 mL/hr over 30 Minutes Intravenous  Once 07/13/19 0006 07/13/19 0009   07/13/19 0015  vancomycin (VANCOCIN) IVPB 1000 mg/200 mL premix  Status:  Discontinued     1,000 mg 200 mL/hr over 60 Minutes Intravenous  Once 07/13/19 0006 07/13/19 0009   07/13/19 0015  piperacillin-tazobactam (ZOSYN) IVPB 3.375 g     3.375 g 12.5 mL/hr over 240 Minutes Intravenous Every 8 hours 07/13/19 0009            Objective:   Vitals:   07/15/19 1609 07/15/19 2043 07/16/19 0336 07/16/19 0732  BP: 126/70 126/68 134/79 134/64  Pulse: 78 85 86 79  Resp: 17 17 18 18   Temp: 98.9 F (37.2 C) 98 F (36.7 C) 98.1 F (36.7 C) 98 F (36.7 C)  TempSrc: Oral Oral Oral Oral  SpO2: 100% 98% 90% 100%  Weight:      Height:        Wt Readings from Last 3 Encounters:  07/13/19 130.2 kg  06/14/19 130.2 kg  12/19/18 133.8 kg     Intake/Output Summary (Last 24 hours) at 07/16/2019 1056 Last data filed at 07/16/2019 1000 Gross per 24 hour  Intake 2949.08 ml  Output --  Net 2949.08 ml     Physical Exam  Awake Alert, Oriented X 3, No new F.N deficits, Normal affect [place].AT,PERRAL Supple Neck,No JVD, No cervical lymphadenopathy appriciated.  Symmetrical Chest wall movement, Good air movement bilaterally, CTAB RRR,No Gallops, Rubs or new Murmurs, No Parasternal Heave +ve B.Sounds, Abd Soft,  No tenderness, No organomegaly appriciated, No rebound - guarding or rigidity. No Cyanosis, left great toe cellulitis with some surrounding cellulitis in the left foot and below-knee leg as well.          Data Review:    CBC Recent Labs  Lab 07/12/19 2356 07/14/19 0452 07/15/19 0424 07/16/19 0224  WBC 7.4 4.5 5.5 4.0  HGB 13.8 12.6* 13.1 11.2*  HCT 39.5 37.4* 39.2 33.5*  PLT 161 149* 172 138*  MCV 94.3 98.2 98.2 97.1  MCH 32.9 33.1 32.8 32.5  MCHC 34.9 33.7 33.4 33.4  RDW 12.6 13.1 12.9 12.8  LYMPHSABS  --  1.7 2.0 1.4  MONOABS  --  0.3 0.4 0.4  EOSABS  --   0.3 0.4 0.3  BASOSABS  --  0.0 0.1 0.0    Chemistries  Recent Labs  Lab 07/12/19 2356 07/14/19 0452 07/15/19 0424 07/16/19 0224  NA 131* 133* 137 135  K 4.5 4.5 4.1 3.9  CL 95* 96* 97* 99  CO2 26 28 28 26   GLUCOSE 173* 266* 164* 226*  BUN 19 11 8 7   CREATININE 0.99 0.99 0.88 0.89  CALCIUM 9.3 9.1 9.6 8.7*  MG 1.7 1.9 2.0 1.8  AST  --  20 23 21   ALT  --  18 21 19   ALKPHOS  --  74 77 67  BILITOT  --  0.7 0.9 0.8   ------------------------------------------------------------------------------------------------------------------ No results for input(s): CHOL, HDL, LDLCALC, TRIG, CHOLHDL, LDLDIRECT in the last 72 hours.  Lab Results  Component Value Date   HGBA1C 6.6 (H) 07/12/2019   ------------------------------------------------------------------------------------------------------------------ No results for input(s): TSH, T4TOTAL, T3FREE, THYROIDAB in the last 72 hours.  Invalid input(s): FREET3 ------------------------------------------------------------------------------------------------------------------ No results for input(s): VITAMINB12, FOLATE, FERRITIN, TIBC, IRON, RETICCTPCT in the last 72 hours.  Coagulation profile No results for input(s): INR, PROTIME in the last 168 hours.  No results for input(s): DDIMER in the last 72 hours.  Cardiac Enzymes No results for input(s): CKMB, TROPONINI, MYOGLOBIN in the last 168 hours.  Invalid input(s): CK ------------------------------------------------------------------------------------------------------------------    Component Value Date/Time   BNP 77.2 07/16/2019 0224    Micro Results Recent Results (from the past 240 hour(s))  Surgical pcr screen     Status: None   Collection Time: 07/13/19 12:08 AM   Specimen: Nasal Mucosa; Nasal Swab  Result Value Ref Range Status   MRSA, PCR NEGATIVE NEGATIVE Final   Staphylococcus aureus NEGATIVE NEGATIVE Final    Comment: (NOTE) The Xpert SA Assay (FDA approved  for NASAL specimens in patients 10 years of age and older), is one component of a comprehensive surveillance program. It is not intended to diagnose infection nor to guide or monitor treatment. Performed at [facility], [place] [address] [place], [place] [address]   SARS CORONAVIRUS 2 (TAT 6-24 HRS) Nasopharyngeal Nasopharyngeal Swab     Status: None   Collection Time: 07/13/19  4:20 AM   Specimen: Nasopharyngeal Swab  Result Value Ref Range Status   SARS Coronavirus 2 NEGATIVE NEGATIVE Final    Comment: (NOTE) SARS-CoV-2 target nucleic acids are NOT DETECTED. The SARS-CoV-2 RNA is generally detectable in upper and lower respiratory specimens during the acute phase of infection. Negative results do not preclude SARS-CoV-2 infection, do not rule out co-infections with other pathogens, and should not be used as the sole basis for treatment or other patient management decisions. Negative results must be combined with clinical observations, patient history, and epidemiological information. The expected result is  Negative. Fact Sheet for Patients: SugarRoll.be Fact Sheet for Healthcare Providers: https://www.woods-mathews.com/ This test is not yet approved or cleared by the [place] FDA and  has been authorized for detection and/or diagnosis of SARS-CoV-2 by FDA under an Emergency Use Authorization (EUA). This EUA will remain  in effect (meaning this test can be used) for the duration of the COVID-19 declaration under Section 56 4(b)(1) of the Act, 21 U.S.C. section 360bbb-3(b)(1), unless the authorization is terminated or revoked sooner. Performed at [facility], [place] [address] [place], [place] [address]     Radiology Reports Mr Foot Left W Wo Contrast  Result Date: 07/16/2019 CLINICAL DATA:  Left great toe fracture complicated infection. Evaluate for osteomyelitis. EXAM: MRI OF THE LEFT FOREFOOT WITHOUT AND WITH CONTRAST  TECHNIQUE: Multiplanar, multisequence MR imaging of the left forefoot was performed both before and after administration of intravenous contrast. CONTRAST:  62mL GADAVIST GADOBUTROL 1 MMOL/ML IV SOLN COMPARISON:  Left foot x-rays dated July 12, 2019. FINDINGS: Bones/Joint/Cartilage No suspicious marrow signal abnormality. Unchanged comminuted fracture of the first proximal phalanx with apex plantar angulation. No dislocation. 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 No joint effusion. Small bone islands in the first and third metatarsal heads. Ligaments Collateral ligaments are intact. Muscles and Tendons Flexor, peroneal and extensor compartment tendons are intact. Increased T2 signal within and atrophy of the intrinsic muscles of the forefoot, nonspecific, but likely related to diabetic muscle changes. Soft tissue Soft tissue swelling and enhancement involving the great toe and dorsal forefoot. No fluid collection or hematoma. No soft tissue mass. Small amount of fluid without rim enhancement at the plantar base of the second MTP joint may represent an adventitial bursa. IMPRESSION: 1. Soft tissue swelling and enhancement involving the great toe and dorsal forefoot, consistent with cellulitis. No evidence of osteomyelitis. No abscess. 2. Unchanged comminuted fracture of the first proximal phalanx. Electronically Signed   By: Titus Dubin M.D.   On: 07/16/2019 07:47   Dg Chest Port 1 View  Result Date: 07/13/2019 CLINICAL DATA:  Cellulitis.  Pre-op respiratory exam EXAM: PORTABLE CHEST 1 VIEW COMPARISON:  10/31/2018 FINDINGS: Stable borderline cardiomegaly. Both lungs are clear. No evidence of pneumothorax or pleural effusion. IMPRESSION: Stable borderline cardiomegaly. No active lung disease. Electronically Signed   By: Marlaine Hind M.D.   On: 07/13/2019 15:09   Dg Foot Complete Left  Result Date: 07/12/2019 Please see detailed radiograph report in office note.  Vas [place] Burnard Bunting With/wo  Tbi  Result Date: 07/13/2019 LOWER EXTREMITY DOPPLER STUDY Indications: Great toe broken, cellulitis. High Risk Factors: Hypertension, hyperlipidemia, Diabetes. Other Factors: ETOH abuse.  Limitations: Today's exam was limited due to Left toe too swollen for toe cuff. Comparison Study: No prior study on file for comparison Performing Technologist: Sharion Dove RVS  Examination Guidelines: A complete evaluation includes at minimum, Doppler waveform signals and systolic blood pressure reading at the level of bilateral brachial, anterior tibial, and posterior tibial arteries, when vessel segments are accessible. Bilateral testing is considered an integral part of a complete examination. Photoelectric Plethysmograph (PPG) waveforms and toe systolic pressure readings are included as required and additional duplex testing as needed. Limited examinations for reoccurring indications may be performed as noted.  [name] Findings: +---------+------------------+-----+---------+--------+  Right     Rt Pressure (mmHg) Index Waveform  Comment   +---------+------------------+-----+---------+--------+  Brachial  147  triphasic           +---------+------------------+-----+---------+--------+  PTA       176                1.20  biphasic            +---------+------------------+-----+---------+--------+  DP        175                1.19  biphasic            +---------+------------------+-----+---------+--------+  Great Toe 108                0.73                      +---------+------------------+-----+---------+--------+ +---------+------------------+-----+---------+----------------------------+  Left      Lt Pressure (mmHg) Index Waveform  Comment                       +---------+------------------+-----+---------+----------------------------+  Brachial  144                      triphasic                               +---------+------------------+-----+---------+----------------------------+  PTA       147                 1.00  biphasic                                +---------+------------------+-----+---------+----------------------------+  DP        150                1.02  biphasic                                +---------+------------------+-----+---------+----------------------------+  Great Toe                                    Toe too swollen for toe cuff  +---------+------------------+-----+---------+----------------------------+ +-------+-----------+--------------+------------+------------+  [name]/TBI Today's [name] Today's TBI    Previous [name] Previous TBI  +-------+-----------+--------------+------------+------------+  Right   1.2         0.73                                      +-------+-----------+--------------+------------+------------+  Left    1.02        cuff too small                            +-------+-----------+--------------+------------+------------+  Summary: Right: Resting right ankle-brachial index is within normal range. No evidence of significant right lower extremity arterial disease. The right toe-brachial index is normal. Left: Resting left ankle-brachial index is within normal range. No evidence of significant left lower extremity arterial disease.  *See table(s) above for measurements and observations.  Electronically signed by Servando Snare MD on 07/13/2019 at 12:54:43 PM.    Final    Vas [place] Lower Extremity Venous (dvt)  Result Date: 07/14/2019  Lower Venous Study Indications: Edema.  Comparison Study: no prior Performing Technologist:  Abram Sander RVS  Examination Guidelines: A complete evaluation includes B-mode imaging, spectral Doppler, color Doppler, and power Doppler as needed of all accessible portions of each vessel. Bilateral testing is considered an integral part of a complete examination. Limited examinations for reoccurring indications may be performed as noted.  +-----+---------------+---------+-----------+----------+--------------+   RIGHT Compressibility Phasicity Spontaneity Properties Thrombus Aging  +-----+---------------+---------+-----------+----------+--------------+  CFV   Full            Yes       Yes                                    +-----+---------------+---------+-----------+----------+--------------+   +---------+---------------+---------+-----------+----------+--------------+  LEFT      Compressibility Phasicity Spontaneity Properties Thrombus Aging  +---------+---------------+---------+-----------+----------+--------------+  CFV       Full            Yes       Yes                                    +---------+---------------+---------+-----------+----------+--------------+  SFJ       Full                                                             +---------+---------------+---------+-----------+----------+--------------+  FV Prox   Full                                                             +---------+---------------+---------+-----------+----------+--------------+  FV Mid    Full                                                             +---------+---------------+---------+-----------+----------+--------------+  FV Distal Full                                                             +---------+---------------+---------+-----------+----------+--------------+  PFV       Full                                                             +---------+---------------+---------+-----------+----------+--------------+  POP       Full            Yes       Yes                                    +---------+---------------+---------+-----------+----------+--------------+  PTV       Full                                                             +---------+---------------+---------+-----------+----------+--------------+  PERO                                                       Not visualized  +---------+---------------+---------+-----------+----------+--------------+     Summary: Right: No evidence of common femoral vein  obstruction. Left: There is no evidence of deep vein thrombosis in the lower extremity. No cystic structure found in the popliteal fossa.  *See table(s) above for measurements and observations. Electronically signed by Servando Snare MD on 07/14/2019 at 7:43:53 PM.    Final     Time Spent in minutes  30   Lala Lund M.D on 07/16/2019 at 10:56 AM  To page go to [email or web address] - password [email or web address]

## 2019-07-16 NOTE — Progress Notes (Addendum)
Pt had been ambulatory and was going through his stuff in the cabinet and bumped his head. Pt sustained a small cut on his forehead. RN cleansed it and applied band-aid. Educated Pt on calling for assistance when getting out of bed. Pt states understanding. Pt now resting in bed comfortably. MD notified.

## 2019-07-16 NOTE — Plan of Care (Signed)

## 2019-07-16 NOTE — Progress Notes (Signed)
Physical Therapy Treatment Patient Details Name: Eugene Diaz MRN: [id number] DOB: 19-Jan-1966 Today's Date: 07/16/2019    History of Present Illness Eugene Diaz is a 53 y.o. male with medical history significant of anxiety, arthritis, asthma, depression, type 2 diabetes, hypertension, hyperlipidemia, neuropathy, peripheral vascular disease presenting to the hospital as a direct admit from podiatry for left great toe fracture and left lower extremity cellulitis.    PT Comments    Pt continues to move about room unassisted.  RN reports he was up earlier in room and hit his head on the shelf.  Pt continues to present with poor safety awareness.  He is having trouble keeping his post op shoe on.  PTA applied non skid grip to insole to prevent sliding but heel continues to slip out of shoe.  Plan for supervising PT to follow up next session as he is going for surgery now.      Follow Up Recommendations  No PT follow up     Equipment Recommendations  Rolling walker with 5" wheels(bariatric width)    Recommendations for Other Services       Precautions / Restrictions Precautions Precautions: Fall Required Braces or Orthoses: Other Brace Other Brace: L post op shoe Restrictions Weight Bearing Restrictions: No    Mobility  Bed Mobility               General bed mobility comments: Pt standing in room with IV pulling and no awareness of this.  Transfers Overall transfer level: Modified independent Equipment used: Rolling walker (2 wheeled) Transfers: Sit to/from Stand Sit to Stand: Modified independent (Device/Increase time)            Ambulation/Gait Ambulation/Gait assistance: Supervision Gait Distance (Feet): 200 Feet Assistive device: Rolling walker (2 wheeled)(Wide) Gait Pattern/deviations: Wide base of support;Step-through pattern;Trunk flexed Gait velocity: WFL   General Gait Details: Pt with poor safety but no true LOB noted.  He required cues for  safety.   Stairs Stairs: Yes Stairs assistance: Supervision Stair Management: Two rails;Step to pattern;Forwards Number of Stairs: 12(performed between 4in and 6 in steps.)     Wheelchair Mobility    Modified Rankin (Stroke Patients Only)       Balance Overall balance assessment: Mild deficits observed, not formally tested                                          Cognition Arousal/Alertness: Awake/alert Behavior During Therapy: WFL for tasks assessed/performed Overall Cognitive Status: Within Functional Limits for tasks assessed                                 General Comments: decreased safety awareness but feel this is baseline      Exercises      General Comments        Pertinent Vitals/Pain      Home Living                      Prior Function            PT Goals (current goals can now be found in the care plan section) Acute Rehab PT Goals Patient Stated Goal: return home Potential to Achieve Goals: Good Progress towards PT goals: Progressing toward goals    Frequency    Min 3X/week  PT Plan Current plan remains appropriate    Co-evaluation              AM-PAC PT "6 Clicks" Mobility   Outcome Measure  Help needed turning from your back to your side while in a flat bed without using bedrails?: None Help needed moving from lying on your back to sitting on the side of a flat bed without using bedrails?: None Help needed moving to and from a bed to a chair (including a wheelchair)?: None Help needed standing up from a chair using your arms (e.g., wheelchair or bedside chair)?: None Help needed to walk in hospital room?: None Help needed climbing 3-5 steps with a railing? : A Little 6 Click Score: 23    End of Session Equipment Utilized During Treatment: Gait belt Activity Tolerance: Patient tolerated treatment well Patient left: in chair;with call bell/phone within reach Nurse  Communication: Mobility status PT Visit Diagnosis: Unsteadiness on feet (R26.81)     Time: [id number] PT Time Calculation (min) (ACUTE ONLY): 20 min  Charges:  $Gait Training: 8-22 mins                     Governor Rooks, PTA Acute Rehabilitation Services Pager [phone] Office [phone]     Khadar Monger Eli Hose 07/16/2019, 4:57 PM

## 2019-07-16 NOTE — Op Note (Signed)
Patient Name: Eugene Diaz DOB: [birth year]  MRN: [id number]   Date of Service: 07/16/2019  Surgeon: Dr. Hardie Pulley, DPM Assistants: None Pre-operative Diagnosis:  Cellulitis Left Great Toe Fracture Proximal Phalanx Left Great Toe Post-operative Diagnosis:  Same Procedures:  1) Incision and Drainage of superficial ulcers of left great toe.  2) Open reduction, percutaneous pinning of left hallux fracture Pathology/Specimens: ID Type Source Tests Collected by Time Destination  A : Left Great Toe Superificial culture Body Fluid PATH Other AEROBIC/ANAEROBIC CULTURE (SURGICAL/DEEP WOUND) Evelina Bucy, DPM 07/16/2019 1900   B : Left Great Toe Deep culture Body Fluid PATH Other AEROBIC/ANAEROBIC CULTURE (SURGICAL/DEEP WOUND) Evelina Bucy, DPM 07/16/2019 1902   C : Left Great Toe Fluid Cytology rule out crystal Body Fluid PATH Cytology Misc. fluid CYTOLOGY - NON PAP, BODY FLUID CULTURE Evelina Bucy, [facility] 07/16/2019 1903    Anesthesia: MAC/local Hemostasis: * No tourniquets in log * Estimated Blood Loss: 25 mL Materials: 2x .045 K wires Medications: 1g Vancomycin powder applied topically. Complications: none  Indications for Procedure:  This is a 53 y.o. male with cellulitis of his left great toe and comminuted fracture of the proximal phalanx. The cellulitis has failed to resolve with IV abx. MRI was performed and did not show signs of osteomyelitis or deep abscess. It was discussed that due to persistent foot and leg cellulitis recalcitrant to IV abx he would benefit from surgical intervention. All risks, benefits, and alternatives of surgery were discussed. No guarantees were given.   Procedure in Detail: Patient was identified in pre-operative holding area. Formal consent was signed and the left lower extremity was marked. Patient was brought back to the operating room. Anesthesia was induced. The extremity was prepped and draped in the usual sterile fashion. Timeout was taken  to confirm patient name, laterality, and procedure prior to incision.   Attention was then directed to the left hallux. There was blistering noted about the digit. The blistering was incised with a 15 blade. Serous material was returned. This was collected for culture. There was still some fluctuance and the area was incised deep to the deep fascia. Arthrotomy was performed. Significant sanguinous drainage with additional small white particles was expressed. This was collected for cytology to r/o crystals and also a deep swab culture was taken.   A freer was then used to reduce the fracture along with manual traction of the digit. The fracture was then fixated with two crossed K wires. This was checked and fluoroscopy and good alignment of the digit was noted. The area was copiously irrigated. The wound was packed with vancomycin powder. It was then closed in layers with 3-0 vicrol, 4-0 nylon, and skin staples.  The foot was then dressed with xeroform, 4x4, kerlix, and ACE bandage. Patient tolerated the procedure well.   Disposition: Following a period of post-operative monitoring, patient will be transferred back to the floor.

## 2019-07-16 NOTE — Anesthesia Preprocedure Evaluation (Addendum)
Anesthesia Evaluation  Patient identified by MRN, date of birth, ID band Patient awake    Reviewed: Allergy & Precautions, H&P , NPO status , Patient's Chart, lab work & pertinent test results, reviewed documented beta blocker date and time   Airway Mallampati: III  TM Distance: >3 FB Neck ROM: full    Dental no notable dental hx. (+) Missing, Chipped, Teeth Intact, Poor Dentition, Loose,    Pulmonary asthma , former smoker,    Pulmonary exam normal breath sounds clear to auscultation       Cardiovascular Exercise Tolerance: Good hypertension, + Peripheral Vascular Disease and +CHF  Normal cardiovascular exam Rhythm:regular Rate:Normal  Echo 2015: Somewhat limited views. Mild LVH with LVEF 55-60%, grade 2  diastolic dysfunction. Mild MAC with trivial mtiral  regurgitation. Mildly sclerotic aortic valve. Mildly  reduced RV contraction. Unable to assess PASP. No  pericardial effusion.    Neuro/Psych PSYCHIATRIC DISORDERS Anxiety Depression negative neurological ROS     GI/Hepatic GERD  ,(+)     substance abuse  alcohol use and marijuana use, 50 drinks/wk   Endo/Other  negative endocrine ROSdiabetes, Type 2, Insulin DependentT2DM with peripheral neuropathy  Renal/GU negative Renal ROS  negative genitourinary   Musculoskeletal   Abdominal (+) + obese,   Peds  Hematology negative hematology ROS (+)   Anesthesia Other Findings Longstanding h/o asthma Chronic pain from peripheral neuropathy NIDDM   Very sleepy,   Reproductive/Obstetrics negative OB ROS                          Anesthesia Physical  Anesthesia Plan  ASA: III  Anesthesia Plan: General   Post-op Pain Management:    Induction: Intravenous  PONV Risk Score and Plan: 2 and Dexamethasone and Ondansetron  Airway Management Planned: LMA  Additional Equipment: None  Intra-op Plan:   Post-operative Plan:    Informed Consent: I have reviewed the patients History and Physical, chart, labs and discussed the procedure including the risks, benefits and alternatives for the proposed anesthesia with the patient or authorized representative who has indicated his/her understanding and acceptance.     Dental Advisory Given  Plan Discussed with: CRNA  Anesthesia Plan Comments:         Anesthesia Quick Evaluation

## 2019-07-16 NOTE — Progress Notes (Signed)
Subjective:  Patient ID: Eugene Diaz, male    DOB: 1966/08/18,  MRN: [id number]  Seen in pre-op. Had MRI performed late last night. Plan was for incision and drainage of developing blistering however patient ate chips and so procedure was canceled. Worsening swelling and blistering noted to the digit today.  Objective:   Vitals:   07/16/19 0732 07/16/19 1534  BP: 134/64 121/80  Pulse: 79 69  Resp: 18 18  Temp: 98 F (36.7 C) 98.4 F (36.9 C)  SpO2: 100% 100%   General AA&O x3. Normal mood and affect.  Vascular Left foot warm with excessive cooling distally. Delayed capillary refill time to the digit.  Neurologic Epicritic sensation grossly diminished.  Dermatologic Left hallux cellulitis, blistering. Cellulitis left leg  Orthopedic: Crepitus on ROM of L hallux   Results for orders placed or performed during the hospital encounter of 07/12/19 (from the past 24 hour(s))  Glucose, capillary     Status: Abnormal   Collection Time: 07/15/19  9:33 PM  Result Value Ref Range   Glucose-Capillary 197 (H) 70 - 99 mg/dL  CBC with Differential/Platelet     Status: Abnormal   Collection Time: 07/16/19  2:24 AM  Result Value Ref Range   WBC 4.0 4.0 - 10.5 K/uL   RBC 3.45 (L) 4.22 - 5.81 MIL/uL   Hemoglobin 11.2 (L) 13.0 - 17.0 g/dL   HCT 33.5 (L) 39.0 - 52.0 %   MCV 97.1 80.0 - 100.0 fL   MCH 32.5 26.0 - 34.0 pg   MCHC 33.4 30.0 - 36.0 g/dL   RDW 12.8 11.5 - 15.5 %   Platelets 138 (L) 150 - 400 K/uL   nRBC 0.0 0.0 - 0.2 %   Neutrophils Relative % 44 %   Neutro Abs 1.8 1.7 - 7.7 K/uL   Lymphocytes Relative 36 %   Lymphs Abs 1.4 0.7 - 4.0 K/uL   Monocytes Relative 10 %   Monocytes Absolute 0.4 0.1 - 1.0 K/uL   Eosinophils Relative 8 %   Eosinophils Absolute 0.3 0.0 - 0.5 K/uL   Basophils Relative 1 %   Basophils Absolute 0.0 0.0 - 0.1 K/uL   Immature Granulocytes 1 %   Abs Immature Granulocytes 0.02 0.00 - 0.07 K/uL  Comprehensive metabolic panel     Status: Abnormal   Collection Time: 07/16/19  2:24 AM  Result Value Ref Range   Sodium 135 135 - 145 mmol/L   Potassium 3.9 3.5 - 5.1 mmol/L   Chloride 99 98 - 111 mmol/L   CO2 26 22 - 32 mmol/L   Glucose, Bld 226 (H) 70 - 99 mg/dL   BUN 7 6 - 20 mg/dL   Creatinine, Ser 0.89 0.61 - 1.24 mg/dL   Calcium 8.7 (L) 8.9 - 10.3 mg/dL   Total Protein 6.7 6.5 - 8.1 g/dL   Albumin 3.2 (L) 3.5 - 5.0 g/dL   AST 21 15 - 41 U/L   ALT 19 0 - 44 U/L   Alkaline Phosphatase 67 38 - 126 U/L   Total Bilirubin 0.8 0.3 - 1.2 mg/dL   GFR calc non Af Amer >60 >60 mL/min   GFR calc Af Amer >60 >60 mL/min   Anion gap 10 5 - 15  Brain natriuretic peptide     Status: None   Collection Time: 07/16/19  2:24 AM  Result Value Ref Range   B Natriuretic Peptide 77.2 0.0 - 100.0 pg/mL  C-reactive protein     Status: Abnormal  Collection Time: 07/16/19  2:24 AM  Result Value Ref Range   CRP 3.1 (H) <1.0 mg/dL  Magnesium     Status: None   Collection Time: 07/16/19  2:24 AM  Result Value Ref Range   Magnesium 1.8 1.7 - 2.4 mg/dL  Glucose, capillary     Status: Abnormal   Collection Time: 07/16/19  6:36 AM  Result Value Ref Range   Glucose-Capillary 176 (H) 70 - 99 mg/dL  Glucose, capillary     Status: Abnormal   Collection Time: 07/16/19 11:26 AM  Result Value Ref Range   Glucose-Capillary 143 (H) 70 - 99 mg/dL  Glucose, capillary     Status: Abnormal   Collection Time: 07/16/19  4:47 PM  Result Value Ref Range   Glucose-Capillary 108 (H) 70 - 99 mg/dL    Assessment & Plan:  Patient was evaluated and treated and all questions answered.  Left Hallux Cellulitis, L leg cellulitis, L hallux fracture -Continued erythema noted, though slightly improved at the hallux. Still active cellulitis of the leg. -DVT negative -Cellulitis of the left leg remains. -MRI reviewed - no deep abscess. -To OR today for incision and drainage, possible pinning of fracture. -Will obtain cultures intra-op.  Evelina Bucy, DPM  Accessible  via secure chat for questions or concerns.

## 2019-07-16 NOTE — Anesthesia Procedure Notes (Signed)
Procedure Name: LMA Insertion Date/Time: 07/16/2019 6:23 PM Performed by: Barrington Ellison, CRNA Pre-anesthesia Checklist: Patient identified, Emergency Drugs available, Suction available and Patient being monitored Patient Re-evaluated:Patient Re-evaluated prior to induction Oxygen Delivery Method: Circle System Utilized Preoxygenation: Pre-oxygenation with 100% oxygen Induction Type: IV induction Ventilation: Mask ventilation without difficulty LMA: LMA inserted LMA Size: 5.0 Number of attempts: 1 Placement Confirmation: positive ETCO2 Tube secured with: Tape Dental Injury: Teeth and Oropharynx as per pre-operative assessment

## 2019-07-16 NOTE — Anesthesia Postprocedure Evaluation (Signed)
Anesthesia Post Note  Patient: JORELL GIBNEY  Procedure(s) Performed: Insicion  AND DEBRIDEMENT EXTREMITY (Left First Toe) Open Reduction Percutaneous Pinning Extremity (First Toe)     Patient location during evaluation: PACU Anesthesia Type: General Level of consciousness: awake and alert Pain management: pain level controlled Vital Signs Assessment: post-procedure vital signs reviewed and stable Respiratory status: spontaneous breathing, nonlabored ventilation and respiratory function stable Cardiovascular status: blood pressure returned to baseline and stable Postop Assessment: no apparent nausea or vomiting Anesthetic complications: no    Last Vitals:  Vitals:   07/16/19 1940 07/16/19 2010  BP:  (!) 137/97  Pulse:  69  Resp: 16 12  Temp: (!) 36.3 C   SpO2:  95%    Last Pain:  Vitals:   07/16/19 1940  TempSrc:   PainSc: 0-No pain                 Audry Pili

## 2019-07-16 NOTE — Transfer of Care (Signed)
Immediate Anesthesia Transfer of Care Note  Patient: JORDAN CRISMON  Procedure(s) Performed: Insicion  AND DEBRIDEMENT EXTREMITY (Left First Toe) Open Reduction Percutaneous Pinning Extremity (First Toe)  Patient Location: PACU  Anesthesia Type:General  Level of Consciousness: drowsy  Airway & Oxygen Therapy: Patient Spontanous Breathing and Patient connected to face mask oxygen  Post-op Assessment: Report given to RN and Post -op Vital signs reviewed and stable  Post vital signs: Reviewed and stable  Last Vitals:  Vitals Value Taken Time  BP 146/70 07/16/19 1941  Temp    Pulse 70 07/16/19 1942  Resp 7 07/16/19 1942  SpO2 100 % 07/16/19 1942  Vitals shown include unvalidated device data.  Last Pain:  Vitals:   07/16/19 1534  TempSrc: Oral  PainSc:       Patients Stated Pain Goal: 2 ([id number])  Complications: No apparent anesthesia complications

## 2019-07-16 NOTE — Brief Op Note (Signed)
07/16/2019  7:34 PM  PATIENT:  Eugene Diaz  53 y.o. male  PRE-OPERATIVE DIAGNOSIS:  Infected Left Great Toe  POST-OPERATIVE DIAGNOSIS:  Infected Left Great Toe  PROCEDURE:  Procedure(s): Insicion  AND DEBRIDEMENT EXTREMITY (Left) Open Reduction Percutaneous Pinning Extremity  SURGEON:  Surgeon(s) and Role:    * Evelina Bucy, DPM - Primary  PHYSICIAN ASSISTANT:   ASSISTANTS: none   ANESTHESIA:   local and MAC  EBL:  25 mL   BLOOD ADMINISTERED:none  DRAINS: none   LOCAL MEDICATIONS USED:  MARCAINE    and Amount: 10 ml  SPECIMEN:   ID Type Source Tests Collected by Time Destination  A : Left Great Toe Superificial culture Body Fluid PATH Other AEROBIC/ANAEROBIC CULTURE (SURGICAL/DEEP WOUND) Evelina Bucy, DPM 07/16/2019 1900   B : Left Great Toe Deep culture Body Fluid PATH Other AEROBIC/ANAEROBIC CULTURE (SURGICAL/DEEP WOUND) Evelina Bucy, DPM 07/16/2019 1902   C : Left Great Toe Fluid Cytology rule out crystal Body Fluid PATH Cytology Misc. fluid CYTOLOGY - NON PAP, BODY FLUID CULTURE Evelina Bucy, DPM 07/16/2019 1903       DISPOSITION OF SPECIMEN:  path,micro  COUNTS:  YES  TOURNIQUET:  * No tourniquets in log *  DICTATION: .Dragon Dictation  PLAN OF CARE: transfer to floor  PATIENT DISPOSITION:  PACU - hemodynamically stable.   Delay start of Pharmacological VTE agent (>24hrs) due to surgical blood loss or risk of bleeding: not applicable

## 2019-07-16 NOTE — Progress Notes (Signed)
Pharmacy Antibiotic Note  Eugene Diaz is a 53 y.o. male admitted on 07/12/2019 with LLE cellulitis .  Pharmacy has been consulted for Vancomycin and Zosyn  Dosing. MRI indicates no abscess and no OM.   Plan: Continue Vancomycin 1500 mg IV q12h Continue Zosyn 3.375 g IV q8h F/u possible de-escalation to PO abx.      Height: 6\' 2"  (188 cm) Weight: 287 lb 0.6 oz (130.2 kg) IBW/kg (Calculated) : 82.2  Temp (24hrs), Avg:98.3 F (36.8 C), Min:98 F (36.7 C), Max:98.9 F (37.2 C)  Recent Labs  Lab 07/12/19 2356 07/14/19 0452 07/15/19 0424 07/16/19 0224  WBC 7.4 4.5 5.5 4.0  CREATININE 0.99 0.99 0.88 0.89    Estimated Creatinine Clearance: 136.1 mL/min (by C-G formula based on SCr of 0.89 mg/dL).    No Known Allergies   Alanson Hausmann A. Levada Dy, PharmD, BCPS, FNKF Clinical Pharmacist [place] Please utilize Amion for appropriate phone number to reach the unit pharmacist ([place])   07/16/2019 9:12 AM

## 2019-07-17 ENCOUNTER — Encounter (HOSPITAL_COMMUNITY): Payer: Self-pay | Admitting: Podiatry

## 2019-07-17 DIAGNOSIS — E11628 Type 2 diabetes mellitus with other skin complications: Secondary | ICD-10-CM

## 2019-07-17 DIAGNOSIS — L03032 Cellulitis of left toe: Secondary | ICD-10-CM

## 2019-07-17 DIAGNOSIS — L089 Local infection of the skin and subcutaneous tissue, unspecified: Secondary | ICD-10-CM

## 2019-07-17 DIAGNOSIS — S92412A Displaced fracture of proximal phalanx of left great toe, initial encounter for closed fracture: Secondary | ICD-10-CM

## 2019-07-17 LAB — CBC WITH DIFFERENTIAL/PLATELET
Abs Immature Granulocytes: 0.01 10*3/uL (ref 0.00–0.07)
Basophils Absolute: 0 10*3/uL (ref 0.0–0.1)
Basophils Relative: 1 %
Eosinophils Absolute: 0 10*3/uL (ref 0.0–0.5)
Eosinophils Relative: 0 %
HCT: 39.1 % (ref 39.0–52.0)
Hemoglobin: 13.5 g/dL (ref 13.0–17.0)
Immature Granulocytes: 0 %
Lymphocytes Relative: 18 %
Lymphs Abs: 0.8 10*3/uL (ref 0.7–4.0)
MCH: 33.2 pg (ref 26.0–34.0)
MCHC: 34.5 g/dL (ref 30.0–36.0)
MCV: 96.1 fL (ref 80.0–100.0)
Monocytes Absolute: 0.1 10*3/uL (ref 0.1–1.0)
Monocytes Relative: 3 %
Neutro Abs: 3.6 10*3/uL (ref 1.7–7.7)
Neutrophils Relative %: 78 %
Platelets: 163 10*3/uL (ref 150–400)
RBC: 4.07 MIL/uL — ABNORMAL LOW (ref 4.22–5.81)
RDW: 12.6 % (ref 11.5–15.5)
WBC: 4.6 10*3/uL (ref 4.0–10.5)
nRBC: 0 % (ref 0.0–0.2)

## 2019-07-17 LAB — GLUCOSE, CAPILLARY
Glucose-Capillary: 272 mg/dL — ABNORMAL HIGH (ref 70–99)
Glucose-Capillary: 227 mg/dL — ABNORMAL HIGH (ref 70–99)
Glucose-Capillary: 121 mg/dL — ABNORMAL HIGH (ref 70–99)
Glucose-Capillary: 248 mg/dL — ABNORMAL HIGH (ref 70–99)

## 2019-07-17 LAB — COMPREHENSIVE METABOLIC PANEL
ALT: 25 U/L (ref 0–44)
AST: 32 U/L (ref 15–41)
Albumin: 3.6 g/dL (ref 3.5–5.0)
Alkaline Phosphatase: 75 U/L (ref 38–126)
Anion gap: 11 (ref 5–15)
BUN: 8 mg/dL (ref 6–20)
CO2: 26 mmol/L (ref 22–32)
Calcium: 9.3 mg/dL (ref 8.9–10.3)
Chloride: 100 mmol/L (ref 98–111)
Creatinine, Ser: 1.02 mg/dL (ref 0.61–1.24)
GFR calc Af Amer: >60 mL/min (ref >60)
GFR calc non Af Amer: >60 mL/min (ref >60)
Glucose, Bld: 283 mg/dL — ABNORMAL HIGH (ref 70–99)
Potassium: 4.3 mmol/L (ref 3.5–5.1)
Sodium: 137 mmol/L (ref 135–145)
Total Bilirubin: 1.2 mg/dL (ref 0.3–1.2)
Total Protein: 7.7 g/dL (ref 6.5–8.1)

## 2019-07-17 LAB — BRAIN NATRIURETIC PEPTIDE: B Natriuretic Peptide: 131.2 pg/mL — ABNORMAL HIGH (ref 0.0–100.0)

## 2019-07-17 LAB — C-REACTIVE PROTEIN: CRP: 4.4 mg/dL — ABNORMAL HIGH (ref <1.0)

## 2019-07-17 LAB — CYTOLOGY - NON PAP

## 2019-07-17 LAB — VANCOMYCIN, PEAK: Vancomycin Pk: 23 ug/mL — ABNORMAL LOW (ref 30–40)

## 2019-07-17 LAB — MAGNESIUM: Magnesium: 2.1 mg/dL (ref 1.7–2.4)

## 2019-07-17 MED ORDER — VASOPRESSIN 20 UNIT/ML IV SOLN
INTRAVENOUS | Status: AC
  Filled 2019-07-17: qty 1

## 2019-07-17 NOTE — Progress Notes (Signed)
Subjective:  Patient ID: Eugene Diaz, male    DOB: Feb 11, 1966,  MRN: [id number]  *Late entry  Seen bedside. Denies new complaints. MRI not performed yet.  Objective:   Vitals:   07/17/19 0729 07/17/19 1521  BP: 125/85 129/80  Pulse: 85 71  Resp: 16 16  Temp: 97.6 F (36.4 C) 98.3 F (36.8 C)  SpO2: 97% 100%   General AA&O x3. Normal mood and affect.  Vascular Left foot warm with excessive cooling distally. Delayed capillary refill time to the digit.  Neurologic Epicritic sensation grossly diminished.  Dermatologic Left hallux cellulitis, blistering proximally. Cellulitis hallux and left leg  Orthopedic: Crepitus on ROM of L hallux   Results for orders placed or performed during the hospital encounter of 07/12/19 (from the past 24 hour(s))  Glucose, capillary     Status: Abnormal   Collection Time: 07/16/19  4:47 PM  Result Value Ref Range   Glucose-Capillary 108 (H) 70 - 99 mg/dL  Aerobic/Anaerobic Culture (surgical/deep wound)     Status: None (Preliminary result)   Collection Time: 07/16/19  7:00 PM   Specimen: PATH Other; Body Fluid  Result Value Ref Range   Specimen Description TOE    Special Requests LEFT GREAT TOE SUPERFICIAL    Gram Stain NO WBC SEEN NO ORGANISMS SEEN     Culture      NO GROWTH < 24 HOURS Performed at [facility], [address], [place], [place] [address]    Report Status PENDING   Aerobic/Anaerobic Culture (surgical/deep wound)     Status: None (Preliminary result)   Collection Time: 07/16/19  7:02 PM   Specimen: PATH Other; Body Fluid  Result Value Ref Range   Specimen Description TOE    Special Requests LEFT GREAT TOE DEEP    Gram Stain NO WBC SEEN NO ORGANISMS SEEN     Culture      NO GROWTH < 24 HOURS Performed at [facility], [address], [place], [place] [address]    Report Status PENDING   Body fluid culture     Status: None (Preliminary result)   Collection Time: 07/16/19  7:03 PM   Specimen: PATH  Cytology Misc. fluid; Body Fluid  Result Value Ref Range   Specimen Description TOE    Special Requests LEFT GREAT TOE FLUID    Gram Stain NO WBC SEEN NO ORGANISMS SEEN     Culture      NO GROWTH < 24 HOURS Performed at [facility], [place] [address], [place], [place] [address]    Report Status PENDING   Glucose, capillary     Status: Abnormal   Collection Time: 07/16/19  7:41 PM  Result Value Ref Range   Glucose-Capillary 100 (H) 70 - 99 mg/dL  Glucose, capillary     Status: Abnormal   Collection Time: 07/16/19  9:22 PM  Result Value Ref Range   Glucose-Capillary 187 (H) 70 - 99 mg/dL  CBC with Differential/Platelet     Status: Abnormal   Collection Time: 07/17/19  3:15 AM  Result Value Ref Range   WBC 4.6 4.0 - 10.5 K/uL   RBC 4.07 (L) 4.22 - 5.81 MIL/uL   Hemoglobin 13.5 13.0 - 17.0 g/dL   HCT 39.1 39.0 - 52.0 %   MCV 96.1 80.0 - 100.0 fL   MCH 33.2 26.0 - 34.0 pg   MCHC 34.5 30.0 - 36.0 g/dL   RDW 12.6 11.5 - 15.5 %  Platelets 163 150 - 400 K/uL   nRBC 0.0 0.0 - 0.2 %   Neutrophils Relative % 78 %   Neutro Abs 3.6 1.7 - 7.7 K/uL   Lymphocytes Relative 18 %   Lymphs Abs 0.8 0.7 - 4.0 K/uL   Monocytes Relative 3 %   Monocytes Absolute 0.1 0.1 - 1.0 K/uL   Eosinophils Relative 0 %   Eosinophils Absolute 0.0 0.0 - 0.5 K/uL   Basophils Relative 1 %   Basophils Absolute 0.0 0.0 - 0.1 K/uL   Immature Granulocytes 0 %   Abs Immature Granulocytes 0.01 0.00 - 0.07 K/uL  Comprehensive metabolic panel     Status: Abnormal   Collection Time: 07/17/19  3:15 AM  Result Value Ref Range   Sodium 137 135 - 145 mmol/L   Potassium 4.3 3.5 - 5.1 mmol/L   Chloride 100 98 - 111 mmol/L   CO2 26 22 - 32 mmol/L   Glucose, Bld 283 (H) 70 - 99 mg/dL   BUN 8 6 - 20 mg/dL   Creatinine, Ser 1.02 0.61 - 1.24 mg/dL   Calcium 9.3 8.9 - 10.3 mg/dL   Total Protein 7.7 6.5 - 8.1 g/dL   Albumin 3.6 3.5 - 5.0 g/dL   AST 32 15 - 41 U/L   ALT 25 0 - 44 U/L   Alkaline Phosphatase 75 38 -  126 U/L   Total Bilirubin 1.2 0.3 - 1.2 mg/dL   GFR calc non Af Amer >60 >60 mL/min   GFR calc Af Amer >60 >60 mL/min   Anion gap 11 5 - 15  Brain natriuretic peptide     Status: Abnormal   Collection Time: 07/17/19  3:15 AM  Result Value Ref Range   B Natriuretic Peptide 131.2 (H) 0.0 - 100.0 pg/mL  C-reactive protein     Status: Abnormal   Collection Time: 07/17/19  3:15 AM  Result Value Ref Range   CRP 4.4 (H) <1.0 mg/dL  Magnesium     Status: None   Collection Time: 07/17/19  3:15 AM  Result Value Ref Range   Magnesium 2.1 1.7 - 2.4 mg/dL  Glucose, capillary     Status: Abnormal   Collection Time: 07/17/19  6:41 AM  Result Value Ref Range   Glucose-Capillary 272 (H) 70 - 99 mg/dL  Glucose, capillary     Status: Abnormal   Collection Time: 07/17/19 11:45 AM  Result Value Ref Range   Glucose-Capillary 227 (H) 70 - 99 mg/dL  Vancomycin, peak     Status: Abnormal   Collection Time: 07/17/19  2:10 PM  Result Value Ref Range   Vancomycin Pk 23 (L) 30 - 40 ug/mL    Assessment & Plan:  Patient was evaluated and treated and all questions answered.  Left Hallux Cellulitis, L leg cellulitis -Continued erythema noted, though slightly improved at the hallux. Still active cellulitis of the leg. -DVT US negative. -Continue abx -Pending MRI. -Plan for surgery tonight after MRI. Continue NPO status. -WBAT LLE in Surgical shoe.  Evelina Bucy, DPM  Accessible via secure chat for questions or concerns.

## 2019-07-17 NOTE — Progress Notes (Signed)
Orthopedic Tech Progress Note Patient Details:  Eugene Diaz [birth year] 53 [id number]  Ortho Devices Type of Ortho Device: CAM walker Ortho Device/Splint Location: left Ortho Device/Splint Interventions: Application   Post Interventions Patient Tolerated: Well Instructions Provided: Care of device   [name] 07/17/2019, 2:31 PM

## 2019-07-17 NOTE — Progress Notes (Signed)
Subjective:  Patient ID: Eugene Diaz, male    DOB: Dec 05, 1965,  MRN: [id number]  Patient seen bedside. Feeling much better today. Redness down.  Objective:   Vitals:   07/17/19 0438 07/17/19 0729  BP: (!) 119/92 125/85  Pulse: 97 85  Resp: 17 16  Temp: 98.2 F (36.8 C) 97.6 F (36.4 C)  SpO2: 98% 97%   General AA&O x3. Normal mood and affect.  Vascular Left foot warm with good capillary refill to the left great toe  Neurologic Epicritic sensation grossly diminished.  Dermatologic Dressing intact left foot. Cellulitis left leg resolving.  Orthopedic: Pin intact left hallux.   Results for orders placed or performed during the hospital encounter of 07/12/19 (from the past 24 hour(s))  Glucose, capillary     Status: Abnormal   Collection Time: 07/16/19  4:47 PM  Result Value Ref Range   Glucose-Capillary 108 (H) 70 - 99 mg/dL  Aerobic/Anaerobic Culture (surgical/deep wound)     Status: None (Preliminary result)   Collection Time: 07/16/19  7:00 PM   Specimen: PATH Other; Body Fluid  Result Value Ref Range   Specimen Description TOE    Special Requests LEFT GREAT TOE SUPERFICIAL    Gram Stain NO WBC SEEN NO ORGANISMS SEEN     Culture      NO GROWTH < 24 HOURS Performed at [facility], [address] [place], [place] [address]    Report Status PENDING   Aerobic/Anaerobic Culture (surgical/deep wound)     Status: None (Preliminary result)   Collection Time: 07/16/19  7:02 PM   Specimen: PATH Other; Body Fluid  Result Value Ref Range   Specimen Description TOE    Special Requests LEFT GREAT TOE DEEP    Gram Stain NO WBC SEEN NO ORGANISMS SEEN     Culture      NO GROWTH < 24 HOURS Performed at [facility], [address] [place], [place] [address]    Report Status PENDING   Body fluid culture     Status: None (Preliminary result)   Collection Time: 07/16/19  7:03 PM   Specimen: PATH Cytology Misc. fluid; Body Fluid  Result Value Ref Range   Specimen Description TOE    Special Requests LEFT GREAT TOE FLUID    Gram Stain NO WBC SEEN NO ORGANISMS SEEN     Culture      NO GROWTH < 24 HOURS Performed at [facility], [place] [address], [place], [place] [address]    Report Status PENDING   Glucose, capillary     Status: Abnormal   Collection Time: 07/16/19  7:41 PM  Result Value Ref Range   Glucose-Capillary 100 (H) 70 - 99 mg/dL  Glucose, capillary     Status: Abnormal   Collection Time: 07/16/19  9:22 PM  Result Value Ref Range   Glucose-Capillary 187 (H) 70 - 99 mg/dL  CBC with Differential/Platelet     Status: Abnormal   Collection Time: 07/17/19  3:15 AM  Result Value Ref Range   WBC 4.6 4.0 - 10.5 K/uL   RBC 4.07 (L) 4.22 - 5.81 MIL/uL   Hemoglobin 13.5 13.0 - 17.0 g/dL   HCT 39.1 39.0 - 52.0 %   MCV 96.1 80.0 - 100.0 fL   MCH 33.2 26.0 - 34.0 pg   MCHC 34.5 30.0 - 36.0 g/dL   RDW 12.6 11.5 - 15.5 %   Platelets 163 150 - 400 K/uL   nRBC  0.0 0.0 - 0.2 %   Neutrophils Relative % 78 %   Neutro Abs 3.6 1.7 - 7.7 K/uL   Lymphocytes Relative 18 %   Lymphs Abs 0.8 0.7 - 4.0 K/uL   Monocytes Relative 3 %   Monocytes Absolute 0.1 0.1 - 1.0 K/uL   Eosinophils Relative 0 %   Eosinophils Absolute 0.0 0.0 - 0.5 K/uL   Basophils Relative 1 %   Basophils Absolute 0.0 0.0 - 0.1 K/uL   Immature Granulocytes 0 %   Abs Immature Granulocytes 0.01 0.00 - 0.07 K/uL  Comprehensive metabolic panel     Status: Abnormal   Collection Time: 07/17/19  3:15 AM  Result Value Ref Range   Sodium 137 135 - 145 mmol/L   Potassium 4.3 3.5 - 5.1 mmol/L   Chloride 100 98 - 111 mmol/L   CO2 26 22 - 32 mmol/L   Glucose, Bld 283 (H) 70 - 99 mg/dL   BUN 8 6 - 20 mg/dL   Creatinine, Ser 1.02 0.61 - 1.24 mg/dL   Calcium 9.3 8.9 - 10.3 mg/dL   Total Protein 7.7 6.5 - 8.1 g/dL   Albumin 3.6 3.5 - 5.0 g/dL   AST 32 15 - 41 U/L   ALT 25 0 - 44 U/L   Alkaline Phosphatase 75 38 - 126 U/L   Total Bilirubin 1.2 0.3 - 1.2 mg/dL   GFR calc non  Af Amer >60 >60 mL/min   GFR calc Af Amer >60 >60 mL/min   Anion gap 11 5 - 15  Brain natriuretic peptide     Status: Abnormal   Collection Time: 07/17/19  3:15 AM  Result Value Ref Range   B Natriuretic Peptide 131.2 (H) 0.0 - 100.0 pg/mL  C-reactive protein     Status: Abnormal   Collection Time: 07/17/19  3:15 AM  Result Value Ref Range   CRP 4.4 (H) <1.0 mg/dL  Magnesium     Status: None   Collection Time: 07/17/19  3:15 AM  Result Value Ref Range   Magnesium 2.1 1.7 - 2.4 mg/dL  Glucose, capillary     Status: Abnormal   Collection Time: 07/17/19  6:41 AM  Result Value Ref Range   Glucose-Capillary 272 (H) 70 - 99 mg/dL  Glucose, capillary     Status: Abnormal   Collection Time: 07/17/19 11:45 AM  Result Value Ref Range   Glucose-Capillary 227 (H) 70 - 99 mg/dL    Results for orders placed or performed during the hospital encounter of 07/12/19  Surgical pcr screen     Status: None   Collection Time: 07/13/19 12:08 AM   Specimen: Nasal Mucosa; Nasal Swab  Result Value Ref Range Status   MRSA, PCR NEGATIVE NEGATIVE Final   Staphylococcus aureus NEGATIVE NEGATIVE Final    Comment: (NOTE) The Xpert SA Assay (FDA approved for NASAL specimens in patients 88 years of age and older), is one component of a comprehensive surveillance program. It is not intended to diagnose infection nor to guide or monitor treatment. Performed at [facility], [place] [address], [place], [place] [address]   SARS CORONAVIRUS 2 (TAT 6-24 HRS) Nasopharyngeal Nasopharyngeal Swab     Status: None   Collection Time: 07/13/19  4:20 AM   Specimen: Nasopharyngeal Swab  Result Value Ref Range Status   SARS Coronavirus 2 NEGATIVE NEGATIVE Final    Comment: (NOTE) SARS-CoV-2 target nucleic acids are NOT DETECTED. The SARS-CoV-2 RNA is generally detectable in upper and lower  respiratory specimens during the acute phase of infection. Negative results do not preclude SARS-CoV-2 infection, do not rule  out co-infections with other pathogens, and should not be used as the sole basis for treatment or other patient management decisions. Negative results must be combined with clinical observations, patient history, and epidemiological information. The expected result is Negative. Fact Sheet for Patients: SugarRoll.be Fact Sheet for Healthcare Providers: https://www.woods-mathews.com/ This test is not yet approved or cleared by the [place] FDA and  has been authorized for detection and/or diagnosis of SARS-CoV-2 by FDA under an Emergency Use Authorization (EUA). This EUA will remain  in effect (meaning this test can be used) for the duration of the COVID-19 declaration under Section 56 4(b)(1) of the Act, 21 U.S.C. section 360bbb-3(b)(1), unless the authorization is terminated or revoked sooner. Performed at [facility], [place] [address], [place], [place] [address]   Aerobic/Anaerobic Culture (surgical/deep wound)     Status: None (Preliminary result)   Collection Time: 07/16/19  7:00 PM   Specimen: PATH Other; Body Fluid  Result Value Ref Range Status   Specimen Description TOE  Final   Special Requests LEFT GREAT TOE SUPERFICIAL  Final   Gram Stain NO WBC SEEN NO ORGANISMS SEEN   Final   Culture   Final    NO GROWTH < 24 HOURS Performed at [facility], [address] [place], [place] [address]    Report Status PENDING  Incomplete  Aerobic/Anaerobic Culture (surgical/deep wound)     Status: None (Preliminary result)   Collection Time: 07/16/19  7:02 PM   Specimen: PATH Other; Body Fluid  Result Value Ref Range Status   Specimen Description TOE  Final   Special Requests LEFT GREAT TOE DEEP  Final   Gram Stain NO WBC SEEN NO ORGANISMS SEEN   Final   Culture   Final    NO GROWTH < 24 HOURS Performed at [facility], [address] [place], [place] [address]    Report Status PENDING  Incomplete  Body fluid  culture     Status: None (Preliminary result)   Collection Time: 07/16/19  7:03 PM   Specimen: PATH Cytology Misc. fluid; Body Fluid  Result Value Ref Range Status   Specimen Description TOE  Final   Special Requests LEFT GREAT TOE FLUID  Final   Gram Stain NO WBC SEEN NO ORGANISMS SEEN   Final   Culture   Final    NO GROWTH < 24 HOURS Performed at [facility], [address] [place], [place] [address]    Report Status PENDING  Incomplete    Assessment & Plan:  Patient was evaluated and treated and all questions answered.  Left Hallux Cellulitis, L leg cellulitis, L hallux fracture; s/p I&D, ORIF and Pinning of toe fracture -Cellulitis markedly improved today. -Cultures pending. -Path pending. -Dressing and pins intact. Toe with good capillary refill. -F/u Friday in office. -Order CAM boot. WBAT in [place] for D/c with PO Abx. Will monitor cultures outpatient.  Evelina Bucy, DPM  Accessible via secure chat for questions or concerns.

## 2019-07-17 NOTE — Progress Notes (Addendum)
Physical Therapy Treatment Patient Details Name: Eugene Diaz MRN: [id number] DOB: Jul 07, 1966 Today's Date: 07/17/2019    History of Present Illness Eugene Diaz is a 53 y.o. male with medical history significant of anxiety, arthritis, asthma, depression, type 2 diabetes, hypertension, hyperlipidemia, neuropathy, peripheral vascular disease presenting to the hospital as a direct admit from podiatry for left great toe fracture and left lower extremity cellulitis.  Pt s/p I and D and pinning of L great toe on 07/16/19 and is WBAT L foot in CAM boot post op.      PT Comments    Pt moving better post op than he did pre-operatively.  He was able to get up and ambulate down the hallway with RW, demonstrate safe stair training, and generally navigate with the use of his RW and no external assist.  Some cues were needed for safety, but otherwise he is hopeful to d/c home soon.  PT will continue to follow acutely for safe mobility progression   Follow Up Recommendations  No PT follow up     Equipment Recommendations  Rolling walker with 5" wheels;Other (comment)(wide RW)    Recommendations for Other Services   NA     Precautions / Restrictions Precautions Precautions: Fall Required Braces or Orthoses: Other Brace Other Brace: L CAM boot Restrictions Weight Bearing Restrictions: No    Mobility  Bed Mobility               General bed mobility comments: Pt was OOB in the recliner chair  Transfers Overall transfer level: Modified independent Equipment used: Rolling walker (2 wheeled) Transfers: Sit to/from Stand Sit to Stand: Modified independent (Device/Increase time)         General transfer comment: cues for safe hand placement and RW use during transitions, no physical help provided.   Ambulation/Gait Ambulation/Gait assistance: Modified independent (Device/Increase time) Gait Distance (Feet): 200 Feet Assistive device: Rolling walker (2 wheeled) Gait  Pattern/deviations: Step-through pattern;Antalgic     General Gait Details: Pt with midlly antalgic gait pattern, reporting liking the CAM boot better than the fx shoe.  Cues to stay inside of RW and for upright posture.  Pt steady on his feet.    Stairs Stairs: Yes Stairs assistance: Modified independent (Device/Increase time) Stair Management: Two rails;Alternating pattern;Forwards Number of Stairs: 5(x2) General stair comments: Pt able to do stairs even with reciprocal pattern with rail use.  Educated re: up with R and down with left, but he was safe (with rail) going reciprocally.           Balance Overall balance assessment: Needs assistance Sitting-balance support: No upper extremity supported;Feet supported Sitting balance-Leahy Scale: Normal     Standing balance support: No upper extremity supported Standing balance-Leahy Scale: Good                              Cognition Arousal/Alertness: Awake/alert Behavior During Therapy: WFL for tasks assessed/performed Overall Cognitive Status: Within Functional Limits for tasks assessed                                               Pertinent Vitals/Pain Pain Assessment: Faces Faces Pain Scale: Hurts little more Pain Location: L foot Pain Descriptors / Indicators: Grimacing;Guarding Pain Intervention(s): Limited activity within patient's tolerance;Monitored during session;Repositioned  PT Goals (current goals can now be found in the care plan section) Acute Rehab PT Goals Patient Stated Goal: return home Progress towards PT goals: Progressing toward goals    Frequency    Min 5X/week      PT Plan Frequency needs to be updated       AM-PAC PT "6 Clicks" Mobility   Outcome Measure  Help needed turning from your back to your side while in a flat bed without using bedrails?: None Help needed moving from lying on your back to sitting on the side of a flat bed without  using bedrails?: None Help needed moving to and from a bed to a chair (including a wheelchair)?: None Help needed standing up from a chair using your arms (e.g., wheelchair or bedside chair)?: None Help needed to walk in hospital room?: None Help needed climbing 3-5 steps with a railing? : None 6 Click Score: 24    End of Session Equipment Utilized During Treatment: Other (comment)(CAM boot L) Activity Tolerance: Patient tolerated treatment well Patient left: in chair;with call bell/phone within reach Nurse Communication: Mobility status PT Visit Diagnosis: Unsteadiness on feet (R26.81)     Time: [id number] PT Time Calculation (min) (ACUTE ONLY): 24 min  Charges:  $Gait Training: 23-37 mins          Kamill Fulbright B. Devlin Mcveigh, PT, DPT  Acute Rehabilitation [phone] pager [phone] office  @ [facility]: [phone]             07/17/2019, 5:34 PM

## 2019-07-17 NOTE — Progress Notes (Signed)
PROGRESS NOTE                                                                                                                                                                                                             Patient Demographics:    Eugene Diaz, is a 53 y.o. male, DOB - [birth year], BP:[id number]  Admit date - 07/12/2019   Admitting Physician Mauricio Gerome Apley, MD  Outpatient Primary MD for the patient is Redmond School, MD  LOS - 5  CC - left toe infection.     Brief Narrative  Eugene Diaz is a 53 y.o. male with medical history significant of anxiety, arthritis, asthma, depression, type 2 diabetes, hypertension, hyperlipidemia, neuropathy, peripheral vascular disease presenting to the hospital as a direct admit from podiatry for left great toe fracture and left lower extremity cellulitis.  Patient states about a week ago he was playing with his grandkids and while running bumped his left great toe into a railroad track.     Subjective:   Patient in bed, appears comfortable, denies any headache, no fever, no chest pain or pressure, no shortness of breath , no abdominal pain. No focal weakness.   Assessment  & Plan :     1.  Left big toe, foot and leg cellulitis big toe fracture. Seen by podiatry, agree with IV vancomycin and Zosyn combination along with supportive care for pain control.  [name] & Vas [place] both stable.  Podiatry following Case discussed with [name] on 07/16/2019, MRI of the left foot noted with no osteomyelitis or abscess, he is status post OR visit on 07/16/2019 where he underwent bone biopsy, results are pending, will give another 24 hours of IV antibiotics thereafter will switch to Bactrim orally with outpatient follow-up with [name].  Home RN for wound care also ordered.    2.  Alcohol use.  Counseled to quit.  Placed on Librium along with CIWA protocol.  Currently no signs of DTs.  3.  Hypertension.  Placed on low-dose Coreg along with  PRN hydralazine.  Blood pressure currently stable.  4.  DM type II.  A1c 6.6, currently on Lantus along with sliding scale for good control.  CBGs high due to stress.  CBG (last 3)  Recent Labs    07/16/19 1941 07/16/19 2122 07/17/19 0641  GLUCAP 100* 187* 272*     Family Communication  :  None  Code Status :  Full  Disposition Plan  : Discharge home  on 07/18/2019.  Consults  :  Podiatry  Procedures  :    [name] & Vas [place] - stable.  MRI L Foot  -   1. Soft tissue swelling and enhancement involving the great toe and dorsal forefoot, consistent with cellulitis. No evidence of osteomyelitis. No abscess. 2. Unchanged comminuted fracture of the first proximal phalanx.   DVT Prophylaxis  :   Heparin ordered    Lab Results  Component Value Date   PLT 163 07/17/2019    Diet :  Diet Order            Diet Carb Modified Fluid consistency: Thin; Room service appropriate? Yes  Diet effective now               Inpatient Medications Scheduled Meds:  carvedilol  3.125 mg Oral BID WC   chlordiazePOXIDE  15 mg Oral TID   folic acid  1 mg Oral Daily   heparin injection (subcutaneous)  5,000 Units Subcutaneous Q8H   insulin aspart  0-9 Units Subcutaneous TID WC   insulin glargine  20 Units Subcutaneous Daily   multivitamin with minerals  1 tablet Oral Daily   thiamine  100 mg Oral Daily   Or   thiamine  100 mg Intravenous Daily   Continuous Infusions:  lactated ringers     piperacillin-tazobactam (ZOSYN)  IV 3.375 g (07/17/19 0511)   vancomycin 1,500 mg (07/17/19 0940)   PRN Meds:.acetaminophen **OR** acetaminophen, hydrALAZINE  Antibiotics  :   Anti-infectives (From admission, onward)   Start     Dose/Rate Route Frequency Ordered Stop   07/16/19 1858  vancomycin (VANCOCIN) powder  Status:  Discontinued       As needed 07/16/19 1858 07/16/19 1935   07/13/19 1000  vancomycin (VANCOCIN) 1,500 mg in sodium chloride 0.9 % 500 mL IVPB     1,500 mg 250 mL/hr over  120 Minutes Intravenous Every 12 hours 07/13/19 0210     07/13/19 0030  vancomycin (VANCOCIN) 2,000 mg in sodium chloride 0.9 % 500 mL IVPB     2,000 mg 250 mL/hr over 120 Minutes Intravenous  Once 07/13/19 0009 07/13/19 0309   07/13/19 0015  piperacillin-tazobactam (ZOSYN) IVPB 3.375 g  Status:  Discontinued     3.375 g 100 mL/hr over 30 Minutes Intravenous  Once 07/13/19 0006 07/13/19 0009   07/13/19 0015  vancomycin (VANCOCIN) IVPB 1000 mg/200 mL premix  Status:  Discontinued     1,000 mg 200 mL/hr over 60 Minutes Intravenous  Once 07/13/19 0006 07/13/19 0009   07/13/19 0015  piperacillin-tazobactam (ZOSYN) IVPB 3.375 g     3.375 g 12.5 mL/hr over 240 Minutes Intravenous Every 8 hours 07/13/19 0009            Objective:   Vitals:   07/16/19 2121 07/17/19 0012 07/17/19 0438 07/17/19 0729  BP: (!) 150/90 135/80 (!) 119/92 125/85  Pulse: 75 78 97 85  Resp: 14  17 16   Temp: 98.7 F (37.1 C) 98.6 F (37 C) 98.2 F (36.8 C) 97.6 F (36.4 C)  TempSrc:  Oral Oral Oral  SpO2: 98% 94% 98% 97%  Weight:      Height:        Wt Readings from Last 3 Encounters:  07/13/19 130.2 kg  06/14/19 130.2 kg  12/19/18 133.8 kg     Intake/Output Summary (Last 24 hours) at 07/17/2019 1037 Last data filed at 07/17/2019 0039 Gross per 24 hour  Intake 1968.69 ml  Output 375 ml  Net 1593.69 ml     Physical Exam  Awake Alert, Oriented X 3, No new F.N deficits, Normal affect .AT,PERRAL Supple Neck,No JVD, No cervical lymphadenopathy appriciated.  Symmetrical Chest wall movement, Good air movement bilaterally, CTAB RRR,No Gallops, Rubs or new Murmurs, No Parasternal Heave +ve B.Sounds, Abd Soft, No tenderness, No organomegaly appriciated, No rebound - guarding or rigidity. No Cyanosis, left toe is under bandage today, surrounding cellulitis seems to be improving          Data Review:    CBC Recent Labs  Lab 07/12/19 2356 07/14/19 0452 07/15/19 0424 07/16/19 0224  07/17/19 0315  WBC 7.4 4.5 5.5 4.0 4.6  HGB 13.8 12.6* 13.1 11.2* 13.5  HCT 39.5 37.4* 39.2 33.5* 39.1  PLT 161 149* 172 138* 163  MCV 94.3 98.2 98.2 97.1 96.1  MCH 32.9 33.1 32.8 32.5 33.2  MCHC 34.9 33.7 33.4 33.4 34.5  RDW 12.6 13.1 12.9 12.8 12.6  LYMPHSABS  --  1.7 2.0 1.4 0.8  MONOABS  --  0.3 0.4 0.4 0.1  EOSABS  --  0.3 0.4 0.3 0.0  BASOSABS  --  0.0 0.1 0.0 0.0    Chemistries  Recent Labs  Lab 07/12/19 2356 07/14/19 0452 07/15/19 0424 07/16/19 0224 07/17/19 0315  NA 131* 133* 137 135 137  K 4.5 4.5 4.1 3.9 4.3  CL 95* 96* 97* 99 100  CO2 26 28 28 26 26   GLUCOSE 173* 266* 164* 226* 283*  BUN 19 11 8 7 8   CREATININE 0.99 0.99 0.88 0.89 1.02  CALCIUM 9.3 9.1 9.6 8.7* 9.3  MG 1.7 1.9 2.0 1.8 2.1  AST  --  20 23 21  32  ALT  --  18 21 19 25   ALKPHOS  --  74 77 67 75  BILITOT  --  0.7 0.9 0.8 1.2   ------------------------------------------------------------------------------------------------------------------ No results for input(s): CHOL, HDL, LDLCALC, TRIG, CHOLHDL, LDLDIRECT in the last 72 hours.  Lab Results  Component Value Date   HGBA1C 6.6 (H) 07/12/2019   ------------------------------------------------------------------------------------------------------------------ No results for input(s): TSH, T4TOTAL, T3FREE, THYROIDAB in the last 72 hours.  Invalid input(s): FREET3 ------------------------------------------------------------------------------------------------------------------ No results for input(s): VITAMINB12, FOLATE, FERRITIN, TIBC, IRON, RETICCTPCT in the last 72 hours.  Coagulation profile No results for input(s): INR, PROTIME in the last 168 hours.  No results for input(s): DDIMER in the last 72 hours.  Cardiac Enzymes No results for input(s): CKMB, TROPONINI, MYOGLOBIN in the last 168 hours.  Invalid input(s): CK ------------------------------------------------------------------------------------------------------------------      Component Value Date/Time   BNP 131.2 (H) 07/17/2019 0315    Micro Results Recent Results (from the past 240 hour(s))  Surgical pcr screen     Status: None   Collection Time: 07/13/19 12:08 AM   Specimen: Nasal Mucosa; Nasal Swab  Result Value Ref Range Status   MRSA, PCR NEGATIVE NEGATIVE Final   Staphylococcus aureus NEGATIVE NEGATIVE Final    Comment: (NOTE) The Xpert SA Assay (FDA approved for NASAL specimens in patients 28 years of age and older), is one component of a comprehensive surveillance program. It is not intended to diagnose infection nor to guide or monitor treatment. Performed at [facility], [place] [address], [place], [place] [address]   SARS CORONAVIRUS 2 (TAT 6-24 HRS) Nasopharyngeal Nasopharyngeal Swab     Status: None   Collection Time: 07/13/19  4:20 AM   Specimen: Nasopharyngeal Swab  Result Value Ref Range Status   SARS Coronavirus 2 NEGATIVE NEGATIVE Final  Comment: (NOTE) SARS-CoV-2 target nucleic acids are NOT DETECTED. The SARS-CoV-2 RNA is generally detectable in upper and lower respiratory specimens during the acute phase of infection. Negative results do not preclude SARS-CoV-2 infection, do not rule out co-infections with other pathogens, and should not be used as the sole basis for treatment or other patient management decisions. Negative results must be combined with clinical observations, patient history, and epidemiological information. The expected result is Negative. Fact Sheet for Patients: SugarRoll.be Fact Sheet for Healthcare Providers: https://www.woods-mathews.com/ This test is not yet approved or cleared by the [place] FDA and  has been authorized for detection and/or diagnosis of SARS-CoV-2 by FDA under an Emergency Use Authorization (EUA). This EUA will remain  in effect (meaning this test can be used) for the duration of the COVID-19 declaration under Section 56 4(b)(1)  of the Act, 21 U.S.C. section 360bbb-3(b)(1), unless the authorization is terminated or revoked sooner. Performed at [facility], [place] [address] [place], [place] [address]   Aerobic/Anaerobic Culture (surgical/deep wound)     Status: None (Preliminary result)   Collection Time: 07/16/19  7:00 PM   Specimen: PATH Other; Body Fluid  Result Value Ref Range Status   Specimen Description TOE  Final   Special Requests LEFT GREAT TOE SUPERFICIAL  Final   Gram Stain   Final    NO WBC SEEN NO ORGANISMS SEEN Performed at [facility], [address], [place], [place] [address]    Culture PENDING  Incomplete   Report Status PENDING  Incomplete  Aerobic/Anaerobic Culture (surgical/deep wound)     Status: None (Preliminary result)   Collection Time: 07/16/19  7:02 PM   Specimen: PATH Other; Body Fluid  Result Value Ref Range Status   Specimen Description TOE  Final   Special Requests LEFT GREAT TOE DEEP  Final   Gram Stain   Final    NO WBC SEEN NO ORGANISMS SEEN Performed at [facility], [address], [place], [place] [address]    Culture PENDING  Incomplete   Report Status PENDING  Incomplete  Body fluid culture     Status: None (Preliminary result)   Collection Time: 07/16/19  7:03 PM   Specimen: PATH Cytology Misc. fluid; Body Fluid  Result Value Ref Range Status   Specimen Description TOE  Final   Special Requests LEFT GREAT TOE FLUID  Final   Gram Stain NO WBC SEEN NO ORGANISMS SEEN   Final   Culture   Final    NO GROWTH < 24 HOURS Performed at [facility], [address] [place], [place] [address]    Report Status PENDING  Incomplete    Radiology Reports Mr Foot Left W Wo Contrast  Result Date: 07/16/2019 CLINICAL DATA:  Left great toe fracture complicated infection. Evaluate for osteomyelitis. EXAM: MRI OF THE LEFT FOREFOOT WITHOUT AND WITH CONTRAST TECHNIQUE: Multiplanar, multisequence MR imaging of the left forefoot was performed both  before and after administration of intravenous contrast. CONTRAST:  85mL GADAVIST GADOBUTROL 1 MMOL/ML IV SOLN COMPARISON:  Left foot x-rays dated July 12, 2019. FINDINGS: Bones/Joint/Cartilage No suspicious marrow signal abnormality. Unchanged comminuted fracture of the first proximal phalanx with apex plantar angulation. No dislocation. 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 No joint effusion. Small bone islands in the first and third metatarsal heads. Ligaments Collateral ligaments are intact. Muscles and Tendons Flexor, peroneal and extensor compartment tendons are intact. Increased T2 signal within and atrophy of  the intrinsic muscles of the forefoot, nonspecific, but likely related to diabetic muscle changes. Soft tissue Soft tissue swelling and enhancement involving the great toe and dorsal forefoot. No fluid collection or hematoma. No soft tissue mass. Small amount of fluid without rim enhancement at the plantar base of the second MTP joint may represent an adventitial bursa. IMPRESSION: 1. Soft tissue swelling and enhancement involving the great toe and dorsal forefoot, consistent with cellulitis. No evidence of osteomyelitis. No abscess. 2. Unchanged comminuted fracture of the first proximal phalanx. Electronically Signed   By: Titus Dubin M.D.   On: 07/16/2019 07:47   Dg Chest Port 1 View  Result Date: 07/13/2019 CLINICAL DATA:  Cellulitis.  Pre-op respiratory exam EXAM: PORTABLE CHEST 1 VIEW COMPARISON:  10/31/2018 FINDINGS: Stable borderline cardiomegaly. Both lungs are clear. No evidence of pneumothorax or pleural effusion. IMPRESSION: Stable borderline cardiomegaly. No active lung disease. Electronically Signed   By: Marlaine Hind M.D.   On: 07/13/2019 15:09   Dg Foot Complete Left  Result Date: 07/12/2019 Please see detailed radiograph report in office note.  Vas [place] Burnard Bunting With/wo Tbi  Result Date: 07/13/2019 LOWER EXTREMITY DOPPLER STUDY Indications: Great toe broken,  cellulitis. High Risk Factors: Hypertension, hyperlipidemia, Diabetes. Other Factors: ETOH abuse.  Limitations: Today's exam was limited due to Left toe too swollen for toe cuff. Comparison Study: No prior study on file for comparison Performing Technologist: Sharion Dove RVS  Examination Guidelines: A complete evaluation includes at minimum, Doppler waveform signals and systolic blood pressure reading at the level of bilateral brachial, anterior tibial, and posterior tibial arteries, when vessel segments are accessible. Bilateral testing is considered an integral part of a complete examination. Photoelectric Plethysmograph (PPG) waveforms and toe systolic pressure readings are included as required and additional duplex testing as needed. Limited examinations for reoccurring indications may be performed as noted.  [name] Findings: +---------+------------------+-----+---------+--------+  Right     Rt Pressure (mmHg) Index Waveform  Comment   +---------+------------------+-----+---------+--------+  Brachial  147                      triphasic           +---------+------------------+-----+---------+--------+  PTA       176                1.20  biphasic            +---------+------------------+-----+---------+--------+  DP        175                1.19  biphasic            +---------+------------------+-----+---------+--------+  Great Toe 108                0.73                      +---------+------------------+-----+---------+--------+ +---------+------------------+-----+---------+----------------------------+  Left      Lt Pressure (mmHg) Index Waveform  Comment                       +---------+------------------+-----+---------+----------------------------+  Brachial  144                      triphasic                               +---------+------------------+-----+---------+----------------------------+  PTA  147                1.00  biphasic                                 +---------+------------------+-----+---------+----------------------------+  DP        150                1.02  biphasic                                +---------+------------------+-----+---------+----------------------------+  Great Toe                                    Toe too swollen for toe cuff  +---------+------------------+-----+---------+----------------------------+ +-------+-----------+--------------+------------+------------+  [name]/TBI Today's [name] Today's TBI    Previous [name] Previous TBI  +-------+-----------+--------------+------------+------------+  Right   1.2         0.73                                      +-------+-----------+--------------+------------+------------+  Left    1.02        cuff too small                            +-------+-----------+--------------+------------+------------+  Summary: Right: Resting right ankle-brachial index is within normal range. No evidence of significant right lower extremity arterial disease. The right toe-brachial index is normal. Left: Resting left ankle-brachial index is within normal range. No evidence of significant left lower extremity arterial disease.  *See table(s) above for measurements and observations.  Electronically signed by Servando Snare MD on 07/13/2019 at 12:54:43 PM.    Final    Vas [place] Lower Extremity Venous (dvt)  Result Date: 07/14/2019  Lower Venous Study Indications: Edema.  Comparison Study: no prior Performing Technologist: Abram Sander RVS  Examination Guidelines: A complete evaluation includes B-mode imaging, spectral Doppler, color Doppler, and power Doppler as needed of all accessible portions of each vessel. Bilateral testing is considered an integral part of a complete examination. Limited examinations for reoccurring indications may be performed as noted.  +-----+---------------+---------+-----------+----------+--------------+  RIGHT Compressibility Phasicity Spontaneity Properties Thrombus Aging   +-----+---------------+---------+-----------+----------+--------------+  CFV   Full            Yes       Yes                                    +-----+---------------+---------+-----------+----------+--------------+   +---------+---------------+---------+-----------+----------+--------------+  LEFT      Compressibility Phasicity Spontaneity Properties Thrombus Aging  +---------+---------------+---------+-----------+----------+--------------+  CFV       Full            Yes       Yes                                    +---------+---------------+---------+-----------+----------+--------------+  SFJ       Full                                                             +---------+---------------+---------+-----------+----------+--------------+  FV Prox   Full                                                             +---------+---------------+---------+-----------+----------+--------------+  FV Mid    Full                                                             +---------+---------------+---------+-----------+----------+--------------+  FV Distal Full                                                             +---------+---------------+---------+-----------+----------+--------------+  PFV       Full                                                             +---------+---------------+---------+-----------+----------+--------------+  POP       Full            Yes       Yes                                    +---------+---------------+---------+-----------+----------+--------------+  PTV       Full                                                             +---------+---------------+---------+-----------+----------+--------------+  PERO                                                       Not visualized  +---------+---------------+---------+-----------+----------+--------------+     Summary: Right: No evidence of common femoral vein obstruction. Left: There is no evidence of deep vein thrombosis in the lower  extremity. No cystic structure found in the popliteal fossa.  *See table(s) above for measurements and observations. Electronically signed by Servando Snare MD on 07/14/2019 at 7:43:53 PM.    Final     Time Spent in minutes  30   Lala Lund M.D on 07/17/2019 at 10:37 AM  To page go to [email or web address] - password [email or web address]

## 2019-07-18 DIAGNOSIS — L03032 Cellulitis of left toe: Secondary | ICD-10-CM

## 2019-07-18 LAB — GLUCOSE, CAPILLARY: Glucose-Capillary: 238 mg/dL — ABNORMAL HIGH (ref 70–99)

## 2019-07-18 MED ORDER — DOXYCYCLINE HYCLATE 100 MG PO CAPS: 100.0000 mg | ORAL_CAPSULE | Freq: Two times a day (BID) | ORAL | 0 refills | Status: DC

## 2019-07-18 NOTE — Progress Notes (Addendum)
Took telemetry completely off and in bathroom.  Awaiting to put telemetry back on and to restart IV.  Patient states that the IV accidentally pulled out.

## 2019-07-18 NOTE — Progress Notes (Signed)
Physical Therapy Treatment Patient Details Name: ETHON CAMPBEL MRN: [id number] DOB: [birth year] Today's Date: 07/18/2019    History of Present Illness STACEY MATEJKA is a 53 y.o. male with medical history significant of anxiety, arthritis, asthma, depression, type 2 diabetes, hypertension, hyperlipidemia, neuropathy, peripheral vascular disease presenting to the hospital as a direct admit from podiatry for left great toe fracture and left lower extremity cellulitis.  Pt s/p I and D and pinning of L great toe on 07/16/19 and is WBAT L foot in CAM boot post op.      PT Comments    Pt tolerated treatment well, progressing to ambulation with use of standard cane. Pt without significant LOB or gait deviations. Pt declining stair training at this time. Pt remains appropriate for discharge home and requires no further PT follow up after discharge. Pt will benefit from widened RW at discharge to improve tolerance for community ambulation.    Follow Up Recommendations  No PT follow up     Equipment Recommendations  Rolling walker with 5" wheels(wide RW)    Recommendations for Other Services       Precautions / Restrictions Precautions Precautions: Fall Required Braces or Orthoses: Other Brace Other Brace: L CAM boot Restrictions Weight Bearing Restrictions: No    Mobility  Bed Mobility                  Transfers Overall transfer level: Modified independent Equipment used: Straight cane Transfers: Sit to/from Stand Sit to Stand: Modified independent (Device/Increase time)            Ambulation/Gait Ambulation/Gait assistance: Modified independent (Device/Increase time) Gait Distance (Feet): 200 Feet Assistive device: Straight cane Gait Pattern/deviations: Step-through pattern Gait velocity: WFL   General Gait Details: increased lateral sway without noticeable LOB   Stairs Stairs: (pt declined stair negotiation)           Wheelchair Mobility    Modified  Rankin (Stroke Patients Only)       Balance   Sitting-balance support: No upper extremity supported;Feet supported Sitting balance-Leahy Scale: Good     Standing balance support: Single extremity supported Standing balance-Leahy Scale: Fair                              Cognition Arousal/Alertness: Awake/alert Behavior During Therapy: WFL for tasks assessed/performed Overall Cognitive Status: Within Functional Limits for tasks assessed                                        Exercises      General Comments        Pertinent Vitals/Pain Pain Assessment: No/denies pain    Home Living                      Prior Function            PT Goals (current goals can now be found in the care plan section) Acute Rehab PT Goals Patient Stated Goal: To go home Progress towards PT goals: Progressing toward goals(stair goal unmet)    Frequency    Min 5X/week      PT Plan Current plan remains appropriate    Co-evaluation              AM-PAC PT "6 Clicks" Mobility   Outcome Measure  Help needed turning from  your back to your side while in a flat bed without using bedrails?: None Help needed moving from lying on your back to sitting on the side of a flat bed without using bedrails?: None Help needed moving to and from a bed to a chair (including a wheelchair)?: None Help needed standing up from a chair using your arms (e.g., wheelchair or bedside chair)?: None Help needed to walk in hospital room?: None Help needed climbing 3-5 steps with a railing? : None 6 Click Score: 24    End of Session Equipment Utilized During Treatment: (L CAM boot) Activity Tolerance: Patient tolerated treatment well Patient left: in chair Nurse Communication: Mobility status PT Visit Diagnosis: Unsteadiness on feet (R26.81)     Time: [id number] PT Time Calculation (min) (ACUTE ONLY): 12 min  Charges:  $Gait Training: 8-22 mins                      Zenaida Niece, PT, DPT Pager: [place] 07/18/2019, 1:05 PM

## 2019-07-18 NOTE — Progress Notes (Signed)
Report was given to [facility], Therapist, sports.  The patient and nurse is aware of discharge teaching done except dressing/wound care.  The patient has been taken to [facility] holding for discharge via wheel chair

## 2019-07-18 NOTE — TOC Transition Note (Addendum)
Transition of Care [facility]) - CM/SW Discharge Note   Patient Details  Name: Eugene Diaz MRN: [id number] Date of Birth: 1966-07-10  [facility] CM/SW Contact:  Midge Minium RN, BSN, NCM-BC, ACM-RN [phone] Phone Number: 07/18/2019, 8:31 AM   Clinical Narrative:    CM following for dispositional needs. CM spoke to patient to discuss POC. Patient states living at home alone and being independent with his ADLs PTA using an AD. PCP/Demo verified. Patient is s/p I and D and pinning of L great toe on 07/16/19; PT eval compled with a bariatric RW recommended. DME preference provided with no preference; referral given to Learta Codding [facility] liaison); AVS updated. Patients RW will be delivered prior to the patient transitioning home. Patient stated his ex-brother-in-law will provide transportation and that he could afford his Rxs. No further needs from CM.   Addendum: 07/18/19 @ 1023-Rudie Sermons RNCM- CM contacted [facility], [facility], [facility], [facility], [facility], [facility], [facility] with either no availability and/or staffing for [facility]. CM spoke to the patient to discuss his wound care needs, with the patient stating his family could be taught the ordered wound care. CM discussed with Dr. Candiss Norse and Caren Griffins RN; still need clarification on his wound care orders.   Addendum: 07/18/19 @ 1107-Charle Clear RNCM-Miriam RN, clarified with [name] (podiatry); [name] will f/u with the patient concerning his wound care during his f/u appointment on 07/19/19. Patient stated his sister and/or mother will accompany him to his appointment and can be taught at that time. No further needs from CM.   Final next level of care: Home/Self Care Barriers to Discharge: No Barriers Identified   Patient Goals and CMS Choice Patient states their goals for this hospitalization and ongoing recovery are:: "to get home" CMS Medicare.gov Compare Post Acute Care list provided to:: Patient Choice offered  to / list presented to : Patient    Discharge Plan and Services                DME Arranged: Walker rolling DME Agency: [place] Date DME Agency Contacted: 07/18/19 Time DME Agency Contacted: 0830 Representative spoke with at DME Agency: Learta Codding (liaison) HH Arranged: NA [place] Agency: NA        Social Determinants of Health ([place]) Interventions     Readmission Risk Interventions No flowsheet data found.

## 2019-07-18 NOTE — Discharge Instructions (Signed)
Keep your left leg and foot clean and dry at all times.    Follow with Primary MD [employer], MD in 7 days   Get CBC, CMP  checked next visit within 1 week by Primary MD    Activity: As tolerated with Full fall precautions use walker/cane & assistance as needed  Disposition Home    Diet: Heart Healthy Low Carb.  Accuchecks 4 times/day, Once in AM empty stomach and then before each meal. Log in all results and show them to your Prim.MD in 3 days. If any glucose reading is under 80 or above 300 call your Prim MD immidiately. Follow Low glucose instructions for glucose under 80 as instructed.   Special Instructions: If you have smoked or chewed Tobacco  in the last 2 yrs please stop smoking, stop any regular Alcohol  and or any Recreational drug use.  On your next visit with your primary care physician please Get Medicines reviewed and adjusted.  Please request your Prim.MD to go over all Hospital Tests and Procedure/Radiological results at the follow up, please get all Hospital records sent to your Prim MD by signing hospital release before you go home.  If you experience worsening of your admission symptoms, develop shortness of breath, life threatening emergency, suicidal or homicidal thoughts you must seek medical attention immediately by calling 911 or calling your MD immediately  if symptoms less severe.  You Must read complete instructions/literature along with all the possible adverse reactions/side effects for all the Medicines you take and that have been prescribed to you. Take any new Medicines after you have completely understood and accpet all the possible adverse reactions/side effects.   Do not drive, operate heavy machinery, perform activities at heights, swimming or participation in water activities or provide baby sitting services if your were admitted for syncope or siezures until you have seen by Primary MD or a Neurologist and advised to do so again.  Do not drive  when taking Pain medications.  Do not take more than prescribed Pain, Sleep and Anxiety Medications  Wear Seat belts while driving.   Please note  You were cared for by a hospitalist during your hospital stay. If you have any questions about your discharge medications or the care you received while you were in the hospital after you are discharged, you can call the unit and asked to speak with the hospitalist on call if the hospitalist that took care of you is not available. Once you are discharged, your primary care physician will handle any further medical issues. Please note that NO REFILLS for any discharge medications will be authorized once you are discharged, as it is imperative that you return to your primary care physician (or establish a relationship with a primary care physician if you do not have one) for your aftercare needs so that they can reassess your need for medications and monitor your lab values.

## 2019-07-18 NOTE — Discharge Summary (Signed)
Eugene Diaz [id number] DOB: 12-06-1965 DOA: 07/12/2019  PCP: Redmond School, MD  Admit date: 07/12/2019  Discharge date: 07/18/2019  Admitted From: Home   Disposition:  Home   Recommendations for Outpatient Follow-up:   Follow up with PCP in 1-2 weeks  PCP Please obtain BMP/CBC, 2 view CXR in 1week,  (see Discharge instructions)   PCP Please follow up on the following pending results:    Home Health: RN,PT   Equipment/Devices: Wide Rolling walker  Consultations: Podiatry Discharge Condition: Stable    CODE STATUS: Full    Diet Recommendation: Heart Healthy Low Carb  CC - L Toe infection   Brief history of present illness from the day of admission and additional interim summary    Eugene J Burchamis a 53 y.o.malewith medical history significant ofanxiety, arthritis, asthma, depression, type 2 diabetes, hypertension, hyperlipidemia, neuropathy, peripheral vascular disease presenting to the hospital as a direct admit from podiatry for left great toe fracture and left lower extremity cellulitis.Patient states about a week ago he was playing with his grandkids and while running bumped his left great toe into a railroad track.                                                                  Hospital Course      1.  Left big toe, foot and leg cellulitis big toe fracture. Seen by podiatry, agree with IV vancomycin and Zosyn combination along with supportive care for pain control.  [name] & Vas [place] both stable.  Podiatry following Case discussed with [name] on 07/16/2019, MRI of the left foot noted with no osteomyelitis or abscess, he is status post OR visit on 07/16/2019 where he underwent bone biopsy, results are pending, overall his cellulitis is much improved, will place him on 1 week of oral doxycycline  with outpatient follow-up with PCP, [name] along with home RN.    He was also provided with a cam boot which she will take home along with a wide rolling walker.  2.  Alcohol use.  Counseled to quit.  Placed on Librium along with CIWA protocol.  Currently no signs of DTs.  3.  Hypertension.    Continue home regimen.  4.  DM type II.  A1c 6.6, resume home Rx.   Discharge diagnosis     Principal Problem:   Cellulitis Active Problems:   Diabetes mellitus without complication ([place])   Hypertension   ETOH abuse   Toe fracture, left   Cellulitis of toe, left   Diabetic infection of left foot [facility])    Discharge instructions    Discharge Instructions    Discharge instructions   Complete by: As directed    Keep your left leg and foot clean and dry at all times.  Follow with Primary MD Redmond School, MD in 7 days   Get CBC, CMP  checked next visit within 1 week by Primary MD    Activity: As tolerated with Full fall precautions use walker/cane & assistance as needed  Disposition Home    Diet: Heart Healthy Low Carb.  Accuchecks 4 times/day, Once in AM empty stomach and then before each meal. Log in all results and show them to your Prim.MD in 3 days. If any glucose reading is under 80 or above 300 call your Prim MD immidiately. Follow Low glucose instructions for glucose under 80 as instructed.   Special Instructions: If you have smoked or chewed Tobacco  in the last 2 yrs please stop smoking, stop any regular Alcohol  and or any Recreational drug use.  On your next visit with your primary care physician please Get Medicines reviewed and adjusted.  Please request your Prim.MD to go over all Hospital Tests and Procedure/Radiological results at the follow up, please get all Hospital records sent to your Prim MD by signing hospital release before you go home.  If you experience worsening of your admission symptoms, develop shortness of breath, life threatening  emergency, suicidal or homicidal thoughts you must seek medical attention immediately by calling 911 or calling your MD immediately  if symptoms less severe.  You Must read complete instructions/literature along with all the possible adverse reactions/side effects for all the Medicines you take and that have been prescribed to you. Take any new Medicines after you have completely understood and accpet all the possible adverse reactions/side effects.   Do not drive, operate heavy machinery, perform activities at heights, swimming or participation in water activities or provide baby sitting services if your were admitted for syncope or siezures until you have seen by Primary MD or a Neurologist and advised to do so again.   Increase activity slowly   Complete by: As directed       Discharge Medications   Allergies as of 07/18/2019   No Known Allergies     Medication List    TAKE these medications   albuterol 108 (90 Base) MCG/ACT inhaler Commonly known as: VENTOLIN HFA Inhale 2 puffs into the lungs every 6 (six) hours as needed for wheezing or shortness of breath.   amitriptyline 100 MG tablet Commonly known as: ELAVIL Take 100 mg by mouth at bedtime.   atorvastatin 40 MG tablet Commonly known as: LIPITOR Take 40 mg by mouth every evening.   canagliflozin 300 MG Tabs tablet Commonly known as: INVOKANA 300 mg daily before breakfast.   docusate sodium 100 MG capsule Commonly known as: COLACE Take 100 mg by mouth daily as needed for mild constipation.   doxycycline 100 MG capsule Commonly known as: VIBRAMYCIN Take 1 capsule (100 mg total) by mouth 2 (two) times daily.   DULoxetine 60 MG capsule Commonly known as: CYMBALTA Take 60 mg by mouth daily.   furosemide 20 MG tablet Commonly known as: LASIX Take 20 mg by mouth daily as needed for fluid.   gabapentin 300 MG capsule Commonly known as: NEURONTIN Take 900mg  in the morning and at noon, then take 1200mg  at bedtime     glimepiride 2 MG tablet Commonly known as: AMARYL Take 2 mg by mouth daily with breakfast.   glycopyrrolate 1 MG tablet Commonly known as: ROBINUL Take 1 mg by mouth 2 (two) times daily.   lidocaine 5 % ointment Commonly known as: XYLOCAINE APPLY TO AFFECTED AREAS TWICE DAILY AS  NEEDED.   lisinopril 20 MG tablet Commonly known as: ZESTRIL Take 20 mg by mouth daily.   omeprazole 20 MG capsule Commonly known as: PRILOSEC Take 1 capsule (20 mg total) by mouth 2 (two) times daily before a meal. 1 PO 30 mins prior to breakfast and supper   oxyCODONE HCl 30 MG Taba Take 30 mg by mouth every 4 (four) hours as needed (for pain).   primidone 50 MG tablet Commonly known as: MYSOLINE Take 1 tablet (50 mg total) by mouth 2 (two) times daily.   Voltaren 1 % Gel Generic drug: diclofenac sodium Apply 2 g topically 4 (four) times daily.            Durable Medical Equipment  (From admission, onward)         Start     Ordered   07/17/19 1531  For home use only DME Walker rolling  Once    Comments: wide  Question:  Patient needs a walker to treat with the following condition  Answer:  Toe ulcer ([place])   07/17/19 1530   07/14/19 0802  For home use only DME Walker rolling  Once    Comments: 5 wheel, wide  Question:  Patient needs a walker to treat with the following condition  Answer:  Toe ulcer ([place])   07/14/19 [place] Follow up.   Why: Best boy information: [place] [address] [phone]        Redmond School, MD. Schedule an appointment as soon as possible for a visit in 1 week(s).   Specialty: Internal Medicine Contact information: [address] [place] [id number] [phone]        Evelina Bucy, DPM. Schedule an appointment as soon as possible for a visit in 1 week(s).   Specialty: Podiatry Why: Follow-up on biopsy and culture results. Contact  information: [address] [place] [address] [phone]           Major procedures and Radiology Reports - PLEASE review detailed and final reports thoroughly  -        Mr Foot Left W Wo Contrast  Result Date: 07/16/2019 CLINICAL DATA:  Left great toe fracture complicated infection. Evaluate for osteomyelitis. EXAM: MRI OF THE LEFT FOREFOOT WITHOUT AND WITH CONTRAST TECHNIQUE: Multiplanar, multisequence MR imaging of the left forefoot was performed both before and after administration of intravenous contrast. CONTRAST:  6mL GADAVIST GADOBUTROL 1 MMOL/ML IV SOLN COMPARISON:  Left foot x-rays dated July 12, 2019. FINDINGS: Bones/Joint/Cartilage No suspicious marrow signal abnormality. Unchanged comminuted fracture of the first proximal phalanx with apex plantar angulation. No dislocation. 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 No joint effusion. Small bone islands in the first and third metatarsal heads. Ligaments Collateral ligaments are intact. Muscles and Tendons Flexor, peroneal and extensor compartment tendons are intact. Increased T2 signal within and atrophy of the intrinsic muscles of the forefoot, nonspecific, but likely related to diabetic muscle changes. Soft tissue Soft tissue swelling and enhancement involving the great toe and dorsal forefoot. No fluid collection or hematoma. No soft tissue mass. Small amount of fluid without rim enhancement at the plantar base of the second MTP joint may represent an adventitial bursa. IMPRESSION: 1. Soft tissue swelling and enhancement involving the great toe and dorsal forefoot, consistent with cellulitis. No evidence of osteomyelitis. No abscess. 2. Unchanged comminuted fracture of the first proximal phalanx.  Electronically Signed   By: Titus Dubin M.D.   On: 07/16/2019 07:47   Dg Chest Port 1 View  Result Date: 07/13/2019 CLINICAL DATA:  Cellulitis.  Pre-op respiratory exam EXAM: PORTABLE CHEST 1 VIEW COMPARISON:   10/31/2018 FINDINGS: Stable borderline cardiomegaly. Both lungs are clear. No evidence of pneumothorax or pleural effusion. IMPRESSION: Stable borderline cardiomegaly. No active lung disease. Electronically Signed   By: Marlaine Hind M.D.   On: 07/13/2019 15:09   Dg Foot Complete Left  Result Date: 07/12/2019 Please see detailed radiograph report in office note.  Vas [place] Burnard Bunting With/wo Tbi  Result Date: 07/13/2019 LOWER EXTREMITY DOPPLER STUDY Indications: Great toe broken, cellulitis. High Risk Factors: Hypertension, hyperlipidemia, Diabetes. Other Factors: ETOH abuse.  Limitations: Today's exam was limited due to Left toe too swollen for toe cuff. Comparison Study: No prior study on file for comparison Performing Technologist: Sharion Dove RVS  Examination Guidelines: A complete evaluation includes at minimum, Doppler waveform signals and systolic blood pressure reading at the level of bilateral brachial, anterior tibial, and posterior tibial arteries, when vessel segments are accessible. Bilateral testing is considered an integral part of a complete examination. Photoelectric Plethysmograph (PPG) waveforms and toe systolic pressure readings are included as required and additional duplex testing as needed. Limited examinations for reoccurring indications may be performed as noted.  [name] Findings: +---------+------------------+-----+---------+--------+  Right     Rt Pressure (mmHg) Index Waveform  Comment   +---------+------------------+-----+---------+--------+  Brachial  147                      triphasic           +---------+------------------+-----+---------+--------+  PTA       176                1.20  biphasic            +---------+------------------+-----+---------+--------+  DP        175                1.19  biphasic            +---------+------------------+-----+---------+--------+  Great Toe 108                0.73                      +---------+------------------+-----+---------+--------+  +---------+------------------+-----+---------+----------------------------+  Left      Lt Pressure (mmHg) Index Waveform  Comment                       +---------+------------------+-----+---------+----------------------------+  Brachial  144                      triphasic                               +---------+------------------+-----+---------+----------------------------+  PTA       147                1.00  biphasic                                +---------+------------------+-----+---------+----------------------------+  DP        150                1.02  biphasic                                +---------+------------------+-----+---------+----------------------------+  Great Toe                                    Toe too swollen for toe cuff  +---------+------------------+-----+---------+----------------------------+ +-------+-----------+--------------+------------+------------+  [name]/TBI Today's [name] Today's TBI    Previous [name] Previous TBI  +-------+-----------+--------------+------------+------------+  Right   1.2         0.73                                      +-------+-----------+--------------+------------+------------+  Left    1.02        cuff too small                            +-------+-----------+--------------+------------+------------+  Summary: Right: Resting right ankle-brachial index is within normal range. No evidence of significant right lower extremity arterial disease. The right toe-brachial index is normal. Left: Resting left ankle-brachial index is within normal range. No evidence of significant left lower extremity arterial disease.  *See table(s) above for measurements and observations.  Electronically signed by Servando Snare MD on 07/13/2019 at 12:54:43 PM.    Final    Vas [place] Lower Extremity Venous (dvt)  Result Date: 07/14/2019  Lower Venous Study Indications: Edema.  Comparison Study: no prior Performing Technologist: Abram Sander RVS  Examination Guidelines: A complete  evaluation includes B-mode imaging, spectral Doppler, color Doppler, and power Doppler as needed of all accessible portions of each vessel. Bilateral testing is considered an integral part of a complete examination. Limited examinations for reoccurring indications may be performed as noted.  +-----+---------------+---------+-----------+----------+--------------+  RIGHT Compressibility Phasicity Spontaneity Properties Thrombus Aging  +-----+---------------+---------+-----------+----------+--------------+  CFV   Full            Yes       Yes                                    +-----+---------------+---------+-----------+----------+--------------+   +---------+---------------+---------+-----------+----------+--------------+  LEFT      Compressibility Phasicity Spontaneity Properties Thrombus Aging  +---------+---------------+---------+-----------+----------+--------------+  CFV       Full            Yes       Yes                                    +---------+---------------+---------+-----------+----------+--------------+  SFJ       Full                                                             +---------+---------------+---------+-----------+----------+--------------+  FV Prox   Full                                                             +---------+---------------+---------+-----------+----------+--------------+  FV Mid    Full                                                             +---------+---------------+---------+-----------+----------+--------------+  FV Distal Full                                                             +---------+---------------+---------+-----------+----------+--------------+  PFV       Full                                                             +---------+---------------+---------+-----------+----------+--------------+  POP       Full            Yes       Yes                                    +---------+---------------+---------+-----------+----------+--------------+   PTV       Full                                                             +---------+---------------+---------+-----------+----------+--------------+  PERO                                                       Not visualized  +---------+---------------+---------+-----------+----------+--------------+     Summary: Right: No evidence of common femoral vein obstruction. Left: There is no evidence of deep vein thrombosis in the lower extremity. No cystic structure found in the popliteal fossa.  *See table(s) above for measurements and observations. Electronically signed by Servando Snare MD on 07/14/2019 at 7:43:53 PM.    Final     Micro Results     Recent Results (from the past 240 hour(s))  Surgical pcr screen     Status: None   Collection Time: 07/13/19 12:08 AM   Specimen: Nasal Mucosa; Nasal Swab  Result Value Ref Range Status   MRSA, PCR NEGATIVE NEGATIVE Final   Staphylococcus aureus NEGATIVE NEGATIVE Final    Comment: (NOTE) The Xpert SA Assay (FDA approved for NASAL specimens in patients 15 years of age and older), is one component of a comprehensive surveillance program. It is not intended to diagnose infection nor to guide or monitor treatment. Performed at [facility], [place] [address] [place], [place] [address]   SARS CORONAVIRUS 2 (TAT 6-24 HRS) Nasopharyngeal Nasopharyngeal Swab     Status: None   Collection Time: 07/13/19  4:20 AM   Specimen: Nasopharyngeal Swab  Result Value Ref Range Status   SARS Coronavirus 2 NEGATIVE NEGATIVE Final    Comment: (NOTE) SARS-CoV-2 target nucleic acids are NOT DETECTED. The SARS-CoV-2 RNA is generally detectable in upper and lower respiratory specimens during the acute phase of infection. Negative results do not preclude SARS-CoV-2 infection, do not rule out co-infections with other pathogens, and should not be used as the sole  basis for treatment or other patient management decisions. Negative results must be combined with clinical  observations, patient history, and epidemiological information. The expected result is Negative. Fact Sheet for Patients: SugarRoll.be Fact Sheet for Healthcare Providers: https://www.woods-mathews.com/ This test is not yet approved or cleared by the [place] FDA and  has been authorized for detection and/or diagnosis of SARS-CoV-2 by FDA under an Emergency Use Authorization (EUA). This EUA will remain  in effect (meaning this test can be used) for the duration of the COVID-19 declaration under Section 56 4(b)(1) of the Act, 21 U.S.C. section 360bbb-3(b)(1), unless the authorization is terminated or revoked sooner. Performed at [facility], [place] [address] [employer], [place] [address]   Aerobic/Anaerobic Culture (surgical/deep wound)     Status: None (Preliminary result)   Collection Time: 07/16/19  7:00 PM   Specimen: PATH Other; Body Fluid  Result Value Ref Range Status   Specimen Description TOE  Final   Special Requests LEFT GREAT TOE SUPERFICIAL  Final   Gram Stain NO WBC SEEN NO ORGANISMS SEEN   Final   Culture   Final    NO GROWTH < 24 HOURS Performed at [facility], [address] [place], [place] [address]    Report Status PENDING  Incomplete  Aerobic/Anaerobic Culture (surgical/deep wound)     Status: None (Preliminary result)   Collection Time: 07/16/19  7:02 PM   Specimen: PATH Other; Body Fluid  Result Value Ref Range Status   Specimen Description TOE  Final   Special Requests LEFT GREAT TOE DEEP  Final   Gram Stain NO WBC SEEN NO ORGANISMS SEEN   Final   Culture   Final    NO GROWTH < 24 HOURS Performed at [facility], [address] [place], [place] [address]    Report Status PENDING  Incomplete  Body fluid culture     Status: None (Preliminary result)   Collection Time: 07/16/19  7:03 PM   Specimen: PATH Cytology Misc. fluid; Body Fluid  Result Value Ref Range Status   Specimen  Description TOE  Final   Special Requests LEFT GREAT TOE FLUID  Final   Gram Stain NO WBC SEEN NO ORGANISMS SEEN   Final   Culture   Final    NO GROWTH < 24 HOURS Performed at [facility], [address], [place], [place] [address]    Report Status PENDING  Incomplete    Today   Subjective    Kavaughn Karlberg today has no headache,no chest abdominal pain,no new weakness tingling or numbness, feels much better wants to go home today.     Objective   Blood pressure 138/75, pulse 62, temperature 97.6 F (36.4 C), temperature source Oral, resp. rate 16, height 6\' 2"  (1.88 m), weight 130.2 kg, SpO2 100 %.   Intake/Output Summary (Last 24 hours) at 07/18/2019 0931 Last data filed at 07/18/2019 0200 Gross per 24 hour  Intake 960 ml  Output --  Net 960 ml    Exam Awake Alert, Oriented x 3, No new F.N deficits, Normal affect [place].AT,PERRAL Supple Neck,No JVD, No cervical lymphadenopathy appriciated.  Symmetrical Chest wall movement, Good air movement bilaterally, CTAB RRR,No Gallops,Rubs or new Murmurs, No Parasternal Heave +ve B.Sounds, Abd Soft, Non tender, No organomegaly appriciated, No rebound -guarding or rigidity. No Cyanosis, Clubbing or edema, left foot under bandage, wearing surgical boot.   Data Review   CBC w Diff:  Lab Results  Component  Value Date   WBC 4.6 07/17/2019   HGB 13.5 07/17/2019   HCT 39.1 07/17/2019   PLT 163 07/17/2019   LYMPHOPCT 18 07/17/2019   MONOPCT 3 07/17/2019   EOSPCT 0 07/17/2019   BASOPCT 1 07/17/2019    CMP:  Lab Results  Component Value Date   NA 137 07/17/2019   K 4.3 07/17/2019   CL 100 07/17/2019   CO2 26 07/17/2019   BUN 8 07/17/2019   CREATININE 1.02 07/17/2019   PROT 7.7 07/17/2019   ALBUMIN 3.6 07/17/2019   BILITOT 1.2 07/17/2019   ALKPHOS 75 07/17/2019   AST 32 07/17/2019   ALT 25 07/17/2019  .   Total Time in preparing paper work, data evaluation and todays exam - 58 minutes  Lala Lund M.D on  07/18/2019 at 9:31 AM  [facility]   Office  [phone]

## 2019-07-18 NOTE — Progress Notes (Signed)
RN gave pt discharge instructions and he stated understanding. RW got delivered pt has prescription for ABX

## 2019-07-18 NOTE — Plan of Care (Signed)
  Problem: Clinical Measurements: Goal: Will remain free from infection Outcome: Progressing   Problem: Activity: Goal: Risk for activity intolerance will decrease Outcome: Progressing   Problem: Pain Managment: Goal: General experience of comfort will improve Outcome: Progressing   Problem: Skin Integrity: Goal: Risk for impaired skin integrity will decrease Outcome: Progressing   

## 2019-07-19 ENCOUNTER — Telehealth: Payer: Self-pay | Admitting: Podiatry

## 2019-07-19 LAB — BODY FLUID CULTURE
Culture: NO GROWTH
Gram Stain: NONE SEEN

## 2019-07-19 NOTE — Telephone Encounter (Signed)
Pt called for a post op appointment, stated he had sx and had to follow up within one week.

## 2019-07-21 LAB — AEROBIC/ANAEROBIC CULTURE W GRAM STAIN (SURGICAL/DEEP WOUND)
Culture: NO GROWTH
Culture: NO GROWTH
Gram Stain: NONE SEEN
Gram Stain: NONE SEEN

## 2019-07-25 ENCOUNTER — Ambulatory Visit (INDEPENDENT_AMBULATORY_CARE_PROVIDER_SITE_OTHER): Payer: Medicaid Other

## 2019-07-25 ENCOUNTER — Other Ambulatory Visit: Payer: Self-pay

## 2019-07-25 ENCOUNTER — Ambulatory Visit (INDEPENDENT_AMBULATORY_CARE_PROVIDER_SITE_OTHER): Payer: Self-pay | Admitting: Podiatry

## 2019-07-25 ENCOUNTER — Other Ambulatory Visit: Payer: Self-pay | Admitting: Podiatry

## 2019-07-25 VITALS — Temp 97.9°F

## 2019-07-25 DIAGNOSIS — S92412A Displaced fracture of proximal phalanx of left great toe, initial encounter for closed fracture: Secondary | ICD-10-CM

## 2019-07-25 DIAGNOSIS — S92412D Displaced fracture of proximal phalanx of left great toe, subsequent encounter for fracture with routine healing: Secondary | ICD-10-CM

## 2019-07-25 DIAGNOSIS — Z9889 Other specified postprocedural states: Secondary | ICD-10-CM

## 2019-07-30 NOTE — Progress Notes (Signed)
Patient ID: Eugene Diaz, male   DOB: 1966-08-10, 53 y.o.   MRN: [id number]     Subjective:  Patient presents for followup, states that he is doing very well.  Denies pain to the toe.  He states that he had to rebandage the area.  He has been messing with the pin, bending it up and down.  Objective:   Constitutional Well developed. Well nourished.  Musculoskeletal: Left toe with some edema, but incision is well-coapted. No warmth, no erythema, no signs of acute infection. Pins appear slightly bent, but do appear to be in stable position. Hyperkeratosis present to the plantar aspect of the digit without active drainage. No open wounds noted. Cellulitis to the left leg completely resolved. Still some edema to the left leg.   Radiographs:  Taken and reviewed consistent with postop state.  Pins appear to be in good position with only slight bending of the pins distally.  Assessment/Plan:  Patient was evaluated and treated and all questions answered.  1.  Status post incision and debridement of left great toe with open reduction and percutaneous pinning of the left great toe. - Overall, patient is doing well.  We did discuss the importance of not messing with the pins.  He did state that they were too long and so I cut the pins with a pin cutter to prevent them from being longer than his boot.  Patient will be weightbearing as tolerated in his boot.  He is to follow up in one week for recheck.  Dry sterile dressing applied.  The patient advised to keep the dressing clean, dry, and intact.

## 2019-08-01 ENCOUNTER — Other Ambulatory Visit: Payer: Self-pay

## 2019-08-01 ENCOUNTER — Ambulatory Visit (INDEPENDENT_AMBULATORY_CARE_PROVIDER_SITE_OTHER): Payer: Medicaid Other

## 2019-08-01 ENCOUNTER — Ambulatory Visit (INDEPENDENT_AMBULATORY_CARE_PROVIDER_SITE_OTHER): Payer: Medicaid Other | Admitting: Podiatry

## 2019-08-01 DIAGNOSIS — S92412A Displaced fracture of proximal phalanx of left great toe, initial encounter for closed fracture: Secondary | ICD-10-CM

## 2019-08-01 NOTE — Progress Notes (Signed)
Subjective:  Patient ID: Eugene Diaz, male    DOB: 04/04/1966,  MRN: [id number]  Chief Complaint  Patient presents with  . Routine Post Op     DOS 07/16/2019 Insicion AND DEBRIDEMENT EXTREMITY, pt states that he has been hitting his toe a couple of times, pt has also not been taking good care of the bandages on his toe as well    DOS: 07/16/2019 Procedure: Open reduction, perc pinning left hallux fracture, incision and drainage left hallux.  53 y.o. male returns for post-op check. Hx as above. States he has been hitting his toe a lot. Can't feel the digit 2/2 neuropathy.  Review of Systems: Negative except as noted in the HPI. Denies N/V/F/Ch.  Past Medical History:  Diagnosis Date  . Anxiety   . Arthritis   . Asthma   . Depression   . Diabetes mellitus without complication ([place]) 0000000  . Hypercholesterolemia   . Hypertension   . Neuropathy   . Peripheral vascular disease ([place])     Current Outpatient Medications:  .  albuterol (PROVENTIL HFA;VENTOLIN HFA) 108 (90 Base) MCG/ACT inhaler, Inhale 2 puffs into the lungs every 6 (six) hours as needed for wheezing or shortness of breath., Disp: 1 Inhaler, Rfl: 2 .  amitriptyline (ELAVIL) 100 MG tablet, Take 100 mg by mouth at bedtime., Disp: , Rfl:  .  atorvastatin (LIPITOR) 40 MG tablet, Take 40 mg by mouth every evening. , Disp: , Rfl: 10 .  canagliflozin (INVOKANA) 300 MG TABS tablet, 300 mg daily before breakfast. , Disp: , Rfl:  .  docusate sodium (COLACE) 100 MG capsule, Take 100 mg by mouth daily as needed for mild constipation., Disp: , Rfl:  .  doxycycline (VIBRAMYCIN) 100 MG capsule, Take 1 capsule (100 mg total) by mouth 2 (two) times daily., Disp: 14 capsule, Rfl: 0 .  DULoxetine (CYMBALTA) 60 MG capsule, Take 60 mg by mouth daily. , Disp: , Rfl: 10 .  furosemide (LASIX) 20 MG tablet, Take 20 mg by mouth daily as needed for fluid. , Disp: , Rfl:  .  gabapentin (NEURONTIN) 300 MG capsule, Take 900mg  in the morning and  at noon, then take 1200mg  at bedtime, Disp: 300 capsule, Rfl: 11 .  glimepiride (AMARYL) 2 MG tablet, Take 2 mg by mouth daily with breakfast., Disp: , Rfl:  .  glycopyrrolate (ROBINUL) 1 MG tablet, Take 1 mg by mouth 2 (two) times daily. , Disp: , Rfl: 2 .  lidocaine (XYLOCAINE) 5 % ointment, APPLY TO AFFECTED AREAS TWICE DAILY AS NEEDED., Disp: 70.88 g, Rfl: 3 .  lisinopril (PRINIVIL,ZESTRIL) 20 MG tablet, Take 20 mg by mouth daily. , Disp: , Rfl: 4 .  omeprazole (PRILOSEC) 20 MG capsule, Take 1 capsule (20 mg total) by mouth 2 (two) times daily before a meal. 1 PO 30 mins prior to breakfast and supper, Disp: 60 capsule, Rfl: 5 .  oxyCODONE HCl 30 MG TABA, Take 30 mg by mouth every 4 (four) hours as needed (for pain). , Disp: , Rfl: 0 .  primidone (MYSOLINE) 50 MG tablet, Take 1 tablet (50 mg total) by mouth 2 (two) times daily., Disp: 60 tablet, Rfl: 11 .  VOLTAREN 1 % GEL, Apply 2 g topically 4 (four) times daily. , Disp: , Rfl:   Social History   Tobacco Use  Smoking Status Former Smoker  . Packs/day: 2.50  . Years: 25.00  . Pack years: 62.50  . Types: Cigarettes  . Quit date: 08/05/1984  .  Years since quitting: 35.0  Smokeless Tobacco Never Used    No Known Allergies Objective:  There were no vitals filed for this visit. There is no height or weight on file to calculate BMI. Constitutional Well developed. Well nourished.  Vascular Foot warm and well perfused. Capillary refill normal to all digits.   Neurologic Normal speech. Oriented to person, place, and time. Epicritic sensation to light touch grossly present bilaterally.  Dermatologic Skin healing well without signs of infection.  Slight fibrosis of the medial aspect of the toe but staples intact.  No warmth or erythema.  Pin sites intact without signs of infection  Orthopedic:  No tenderness to palpation noted about the surgical site.   Radiographs: Taken and reviewed.  Progressive backout of the [place].  New  displacement will be distal aspect of the proximal phalanx Assessment:   1. Closed displaced fracture of proximal phalanx of left great toe, initial encounter    Plan:  Patient was evaluated and treated and all questions answered.  S/p foot surgery left -Progressing as expected post-operatively. -XR: as above -Discussed with patient that his right knee fracture has now moved and the pain appears to have backed out.  This is likely due to him hitting his toe.  As he is not having any pain I do not think that surgical intervention would is indicated at this time.  Discussed poor healing related to this.  The wound was then thoroughly irrigated and redressed with ointment -WB Status: WBAT in boot -Sutures: intact. -Medications: none refilled.  No follow-ups on file.

## 2019-08-09 ENCOUNTER — Ambulatory Visit (INDEPENDENT_AMBULATORY_CARE_PROVIDER_SITE_OTHER): Payer: Self-pay | Admitting: Podiatry

## 2019-08-09 ENCOUNTER — Other Ambulatory Visit: Payer: Self-pay

## 2019-08-09 ENCOUNTER — Other Ambulatory Visit: Payer: Self-pay | Admitting: Podiatry

## 2019-08-09 ENCOUNTER — Ambulatory Visit (INDEPENDENT_AMBULATORY_CARE_PROVIDER_SITE_OTHER): Payer: Medicaid Other

## 2019-08-09 VITALS — Temp 98.2°F

## 2019-08-09 DIAGNOSIS — S92412D Displaced fracture of proximal phalanx of left great toe, subsequent encounter for fracture with routine healing: Secondary | ICD-10-CM

## 2019-08-09 DIAGNOSIS — S92412A Displaced fracture of proximal phalanx of left great toe, initial encounter for closed fracture: Secondary | ICD-10-CM

## 2019-08-10 NOTE — Progress Notes (Signed)
Subjective:  Patient ID: Eugene Diaz, male    DOB: July 15, 1966,  MRN: [id number]  Chief Complaint  Patient presents with  . Routine Post Op    DOS 07/16/2019 Incision and debridement extremity. Pt states healing well, no concerns, denies fever/chills/nausea/vomiting.     DOS: 07/16/2019 Procedure: Open reduction, perc pinning left hallux fracture, incision and drainage left hallux.  53 y.o. male returns for post-op check.  Patient states that he is doing well.  He has been ambulating with his cam boot on.  He also states that he has been moving the the K wire pin in a circular motion.  He denies any fever nausea chills vomiting.  Today his vitals in office have been stable.  There is some mild erythema around the incision site.  Review of Systems: Negative except as noted in the HPI. Denies N/V/F/Ch.  Past Medical History:  Diagnosis Date  . Anxiety   . Arthritis   . Asthma   . Depression   . Diabetes mellitus without complication ([place]) 0000000  . Hypercholesterolemia   . Hypertension   . Neuropathy   . Peripheral vascular disease ([place])     Current Outpatient Medications:  .  albuterol (PROVENTIL HFA;VENTOLIN HFA) 108 (90 Base) MCG/ACT inhaler, Inhale 2 puffs into the lungs every 6 (six) hours as needed for wheezing or shortness of breath., Disp: 1 Inhaler, Rfl: 2 .  amitriptyline (ELAVIL) 100 MG tablet, Take 100 mg by mouth at bedtime., Disp: , Rfl:  .  atorvastatin (LIPITOR) 40 MG tablet, Take 40 mg by mouth every evening. , Disp: , Rfl: 10 .  canagliflozin (INVOKANA) 300 MG TABS tablet, 300 mg daily before breakfast. , Disp: , Rfl:  .  docusate sodium (COLACE) 100 MG capsule, Take 100 mg by mouth daily as needed for mild constipation., Disp: , Rfl:  .  doxycycline (VIBRAMYCIN) 100 MG capsule, Take 1 capsule (100 mg total) by mouth 2 (two) times daily., Disp: 14 capsule, Rfl: 0 .  DULoxetine (CYMBALTA) 60 MG capsule, Take 60 mg by mouth daily. , Disp: , Rfl: 10 .  furosemide  (LASIX) 20 MG tablet, Take 20 mg by mouth daily as needed for fluid. , Disp: , Rfl:  .  gabapentin (NEURONTIN) 300 MG capsule, Take 900mg  in the morning and at noon, then take 1200mg  at bedtime, Disp: 300 capsule, Rfl: 11 .  glimepiride (AMARYL) 2 MG tablet, Take 2 mg by mouth daily with breakfast., Disp: , Rfl:  .  glycopyrrolate (ROBINUL) 1 MG tablet, Take 1 mg by mouth 2 (two) times daily. , Disp: , Rfl: 2 .  lidocaine (XYLOCAINE) 5 % ointment, APPLY TO AFFECTED AREAS TWICE DAILY AS NEEDED., Disp: 70.88 g, Rfl: 3 .  lisinopril (PRINIVIL,ZESTRIL) 20 MG tablet, Take 20 mg by mouth daily. , Disp: , Rfl: 4 .  omeprazole (PRILOSEC) 20 MG capsule, Take 1 capsule (20 mg total) by mouth 2 (two) times daily before a meal. 1 PO 30 mins prior to breakfast and supper, Disp: 60 capsule, Rfl: 5 .  oxyCODONE HCl 30 MG TABA, Take 30 mg by mouth every 4 (four) hours as needed (for pain). , Disp: , Rfl: 0 .  primidone (MYSOLINE) 50 MG tablet, Take 1 tablet (50 mg total) by mouth 2 (two) times daily., Disp: 60 tablet, Rfl: 11 .  VOLTAREN 1 % GEL, Apply 2 g topically 4 (four) times daily. , Disp: , Rfl:   Social History   Tobacco Use  Smoking Status Former  Smoker  . Packs/day: 2.50  . Years: 25.00  . Pack years: 62.50  . Types: Cigarettes  . Quit date: 08/05/1984  . Years since quitting: 35.0  Smokeless Tobacco Never Used    No Known Allergies Objective:   Vitals:   08/09/19 1152  Temp: 98.2 F (36.8 C)   There is no height or weight on file to calculate BMI. Constitutional Well developed. Well nourished.  Vascular Foot warm and well perfused. Capillary refill normal to all digits.   Neurologic Normal speech. Oriented to person, place, and time. Epicritic sensation to light touch grossly present bilaterally.  Dermatologic Skin healing well without signs of infection. Skin edges well coapted without signs of infection.  Orthopedic: Tenderness to palpation noted about the surgical site.    Radiographs: 2 views of skeletally mature adult.  The fragment sites are healing well.  The K wires are intact without any sign of loosening or breaking.  No concerns for acute osteomyelitis as there is no cortical irregularities noted at this time. Assessment:   1. Closed displaced fracture of proximal phalanx of left great toe, initial encounter    Plan:  Patient was evaluated and treated and all questions answered.  S/p foot surgery left -Progressing as expected post-operatively. -WB Status: Weightbearing as tolerated in cam boot -Sutures: Sutures and staples were removed.. -Medications: None -Foot redressed.  Return in about 1 week (around 08/16/2019).

## 2019-08-12 ENCOUNTER — Encounter: Payer: Self-pay | Admitting: Podiatry

## 2019-08-13 ENCOUNTER — Inpatient Hospital Stay (HOSPITAL_COMMUNITY)
Admission: AD | Admit: 2019-08-13 | Discharge: 2019-08-17 | DRG: 854 | Disposition: A | Discharge status: Home / Self Care | Payer: Medicaid Other | Source: Ambulatory Visit | Attending: Internal Medicine | Admitting: Internal Medicine

## 2019-08-13 ENCOUNTER — Other Ambulatory Visit: Payer: Self-pay

## 2019-08-13 ENCOUNTER — Emergency Department (HOSPITAL_COMMUNITY)
Admission: EM | Admit: 2019-08-13 | Discharge: 2019-08-13 | Discharge status: Absent without leave | Payer: Medicaid Other

## 2019-08-13 ENCOUNTER — Telehealth: Payer: Self-pay | Admitting: Podiatry

## 2019-08-13 ENCOUNTER — Telehealth: Payer: Self-pay | Admitting: *Deleted

## 2019-08-13 ENCOUNTER — Ambulatory Visit (INDEPENDENT_AMBULATORY_CARE_PROVIDER_SITE_OTHER): Payer: Medicaid Other

## 2019-08-13 ENCOUNTER — Inpatient Hospital Stay (HOSPITAL_COMMUNITY): Payer: Medicaid Other

## 2019-08-13 ENCOUNTER — Telehealth: Payer: Self-pay | Admitting: Internal Medicine

## 2019-08-13 ENCOUNTER — Ambulatory Visit (INDEPENDENT_AMBULATORY_CARE_PROVIDER_SITE_OTHER): Payer: Medicaid Other | Admitting: Podiatry

## 2019-08-13 DIAGNOSIS — D638 Anemia in other chronic diseases classified elsewhere: Secondary | ICD-10-CM | POA: Diagnosis present

## 2019-08-13 DIAGNOSIS — F1010 Alcohol abuse, uncomplicated: Secondary | ICD-10-CM | POA: Diagnosis not present

## 2019-08-13 DIAGNOSIS — L02612 Cutaneous abscess of left foot: Secondary | ICD-10-CM | POA: Diagnosis present

## 2019-08-13 DIAGNOSIS — I10 Essential (primary) hypertension: Secondary | ICD-10-CM | POA: Diagnosis present

## 2019-08-13 DIAGNOSIS — L03032 Cellulitis of left toe: Secondary | ICD-10-CM

## 2019-08-13 DIAGNOSIS — A419 Sepsis, unspecified organism: Secondary | ICD-10-CM | POA: Diagnosis not present

## 2019-08-13 DIAGNOSIS — A4101 Sepsis due to Methicillin susceptible Staphylococcus aureus: Principal | ICD-10-CM | POA: Diagnosis present

## 2019-08-13 DIAGNOSIS — M86272 Subacute osteomyelitis, left ankle and foot: Secondary | ICD-10-CM

## 2019-08-13 DIAGNOSIS — Z9889 Other specified postprocedural states: Secondary | ICD-10-CM

## 2019-08-13 DIAGNOSIS — G252 Other specified forms of tremor: Secondary | ICD-10-CM | POA: Diagnosis present

## 2019-08-13 DIAGNOSIS — Z9119 Patient's noncompliance with other medical treatment and regimen: Secondary | ICD-10-CM

## 2019-08-13 DIAGNOSIS — L03116 Cellulitis of left lower limb: Secondary | ICD-10-CM | POA: Diagnosis present

## 2019-08-13 DIAGNOSIS — E1151 Type 2 diabetes mellitus with diabetic peripheral angiopathy without gangrene: Secondary | ICD-10-CM | POA: Diagnosis present

## 2019-08-13 DIAGNOSIS — F329 Major depressive disorder, single episode, unspecified: Secondary | ICD-10-CM | POA: Diagnosis present

## 2019-08-13 DIAGNOSIS — K219 Gastro-esophageal reflux disease without esophagitis: Secondary | ICD-10-CM | POA: Diagnosis present

## 2019-08-13 DIAGNOSIS — Z6836 Body mass index (BMI) 36.0-36.9, adult: Secondary | ICD-10-CM

## 2019-08-13 DIAGNOSIS — M869 Osteomyelitis, unspecified: Secondary | ICD-10-CM | POA: Diagnosis not present

## 2019-08-13 DIAGNOSIS — M1990 Unspecified osteoarthritis, unspecified site: Secondary | ICD-10-CM | POA: Diagnosis present

## 2019-08-13 DIAGNOSIS — L0390 Cellulitis, unspecified: Secondary | ICD-10-CM | POA: Diagnosis present

## 2019-08-13 DIAGNOSIS — E871 Hypo-osmolality and hyponatremia: Secondary | ICD-10-CM | POA: Diagnosis present

## 2019-08-13 DIAGNOSIS — S92412A Displaced fracture of proximal phalanx of left great toe, initial encounter for closed fracture: Secondary | ICD-10-CM

## 2019-08-13 DIAGNOSIS — F10188 Alcohol abuse with other alcohol-induced disorder: Secondary | ICD-10-CM | POA: Diagnosis present

## 2019-08-13 DIAGNOSIS — X58XXXD Exposure to other specified factors, subsequent encounter: Secondary | ICD-10-CM | POA: Diagnosis present

## 2019-08-13 DIAGNOSIS — D649 Anemia, unspecified: Secondary | ICD-10-CM | POA: Diagnosis present

## 2019-08-13 DIAGNOSIS — D696 Thrombocytopenia, unspecified: Secondary | ICD-10-CM | POA: Diagnosis present

## 2019-08-13 DIAGNOSIS — I152 Hypertension secondary to endocrine disorders: Secondary | ICD-10-CM | POA: Diagnosis present

## 2019-08-13 DIAGNOSIS — E1142 Type 2 diabetes mellitus with diabetic polyneuropathy: Secondary | ICD-10-CM | POA: Diagnosis present

## 2019-08-13 DIAGNOSIS — E1169 Type 2 diabetes mellitus with other specified complication: Secondary | ICD-10-CM | POA: Diagnosis present

## 2019-08-13 DIAGNOSIS — Z8249 Family history of ischemic heart disease and other diseases of the circulatory system: Secondary | ICD-10-CM

## 2019-08-13 DIAGNOSIS — E7800 Pure hypercholesterolemia, unspecified: Secondary | ICD-10-CM | POA: Diagnosis present

## 2019-08-13 DIAGNOSIS — Z87891 Personal history of nicotine dependence: Secondary | ICD-10-CM

## 2019-08-13 DIAGNOSIS — M86271 Subacute osteomyelitis, right ankle and foot: Secondary | ICD-10-CM

## 2019-08-13 DIAGNOSIS — E669 Obesity, unspecified: Secondary | ICD-10-CM | POA: Diagnosis present

## 2019-08-13 DIAGNOSIS — L97429 Non-pressure chronic ulcer of left heel and midfoot with unspecified severity: Secondary | ICD-10-CM | POA: Diagnosis present

## 2019-08-13 DIAGNOSIS — L97509 Non-pressure chronic ulcer of other part of unspecified foot with unspecified severity: Secondary | ICD-10-CM | POA: Diagnosis not present

## 2019-08-13 DIAGNOSIS — Z7289 Other problems related to lifestyle: Secondary | ICD-10-CM

## 2019-08-13 DIAGNOSIS — S92412D Displaced fracture of proximal phalanx of left great toe, subsequent encounter for fracture with routine healing: Secondary | ICD-10-CM

## 2019-08-13 DIAGNOSIS — M86172 Other acute osteomyelitis, left ankle and foot: Secondary | ICD-10-CM | POA: Diagnosis present

## 2019-08-13 DIAGNOSIS — E785 Hyperlipidemia, unspecified: Secondary | ICD-10-CM | POA: Diagnosis present

## 2019-08-13 DIAGNOSIS — E11621 Type 2 diabetes mellitus with foot ulcer: Secondary | ICD-10-CM | POA: Diagnosis present

## 2019-08-13 DIAGNOSIS — D6959 Other secondary thrombocytopenia: Secondary | ICD-10-CM | POA: Diagnosis present

## 2019-08-13 DIAGNOSIS — E538 Deficiency of other specified B group vitamins: Secondary | ICD-10-CM | POA: Diagnosis present

## 2019-08-13 DIAGNOSIS — Z20828 Contact with and (suspected) exposure to other viral communicable diseases: Secondary | ICD-10-CM | POA: Diagnosis present

## 2019-08-13 DIAGNOSIS — Z79899 Other long term (current) drug therapy: Secondary | ICD-10-CM

## 2019-08-13 DIAGNOSIS — G621 Alcoholic polyneuropathy: Secondary | ICD-10-CM | POA: Diagnosis not present

## 2019-08-13 DIAGNOSIS — J45909 Unspecified asthma, uncomplicated: Secondary | ICD-10-CM | POA: Diagnosis present

## 2019-08-13 DIAGNOSIS — E1159 Type 2 diabetes mellitus with other circulatory complications: Secondary | ICD-10-CM | POA: Diagnosis present

## 2019-08-13 LAB — COMPREHENSIVE METABOLIC PANEL
ALT: 13 U/L (ref 0–44)
AST: 16 U/L (ref 15–41)
Albumin: 3.2 g/dL — ABNORMAL LOW (ref 3.5–5.0)
Alkaline Phosphatase: 73 U/L (ref 38–126)
Anion gap: 11 (ref 5–15)
BUN: 15 mg/dL (ref 6–20)
CO2: 23 mmol/L (ref 22–32)
Calcium: 8.8 mg/dL — ABNORMAL LOW (ref 8.9–10.3)
Chloride: 96 mmol/L — ABNORMAL LOW (ref 98–111)
Creatinine, Ser: 0.95 mg/dL (ref 0.61–1.24)
GFR calc Af Amer: >60 mL/min (ref >60)
GFR calc non Af Amer: >60 mL/min (ref >60)
Glucose, Bld: 136 mg/dL — ABNORMAL HIGH (ref 70–99)
Potassium: 3.5 mmol/L (ref 3.5–5.1)
Sodium: 130 mmol/L — ABNORMAL LOW (ref 135–145)
Total Bilirubin: 0.9 mg/dL (ref 0.3–1.2)
Total Protein: 6.9 g/dL (ref 6.5–8.1)

## 2019-08-13 LAB — CBC
HCT: 33.1 % — ABNORMAL LOW (ref 39.0–52.0)
Hemoglobin: 11.3 g/dL — ABNORMAL LOW (ref 13.0–17.0)
MCH: 31.7 pg (ref 26.0–34.0)
MCHC: 34.1 g/dL (ref 30.0–36.0)
MCV: 93 fL (ref 80.0–100.0)
Platelets: 137 10*3/uL — ABNORMAL LOW (ref 150–400)
RBC: 3.56 MIL/uL — ABNORMAL LOW (ref 4.22–5.81)
RDW: 12.4 % (ref 11.5–15.5)
WBC: 7.4 10*3/uL (ref 4.0–10.5)
nRBC: 0 % (ref 0.0–0.2)

## 2019-08-13 LAB — IRON AND TIBC
Iron: 14 ug/dL — ABNORMAL LOW (ref 45–182)
Saturation Ratios: 6 % — ABNORMAL LOW (ref 17.9–39.5)
TIBC: 227 ug/dL — ABNORMAL LOW (ref 250–450)
UIBC: 213 ug/dL

## 2019-08-13 LAB — FERRITIN: Ferritin: 375 ng/mL — ABNORMAL HIGH (ref 24–336)

## 2019-08-13 LAB — LACTIC ACID, PLASMA: Lactic Acid, Venous: 1 mmol/L (ref 0.5–1.9)

## 2019-08-13 LAB — VITAMIN B12: Vitamin B-12: 52 pg/mL — ABNORMAL LOW (ref 180–914)

## 2019-08-13 LAB — GLUCOSE, CAPILLARY: Glucose-Capillary: 148 mg/dL — ABNORMAL HIGH (ref 70–99)

## 2019-08-13 MED ORDER — LORAZEPAM 2 MG/ML IJ SOLN
1.0000 mg | INTRAMUSCULAR | Status: AC | PRN
  Administered 2019-08-14: 2 mg via INTRAVENOUS
  Filled 2019-08-13: qty 1

## 2019-08-13 MED ORDER — ENOXAPARIN SODIUM 40 MG/0.4ML ~~LOC~~ SOLN
40.0000 mg | SUBCUTANEOUS | Status: DC
  Administered 2019-08-13: 40 mg via SUBCUTANEOUS
  Filled 2019-08-13: qty 0.4

## 2019-08-13 MED ORDER — SODIUM CHLORIDE 0.9 % IV SOLN
INTRAVENOUS | Status: AC
  Administered 2019-08-13 – 2019-08-14 (×2): via INTRAVENOUS

## 2019-08-13 MED ORDER — OXYCODONE HCL 5 MG PO TABS
30.0000 mg | ORAL_TABLET | ORAL | Status: DC | PRN
  Administered 2019-08-14 – 2019-08-17 (×8): 30 mg via ORAL
  Filled 2019-08-13 (×8): qty 6

## 2019-08-13 MED ORDER — VANCOMYCIN HCL 10 G IV SOLR
2500.0000 mg | Freq: Once | INTRAVENOUS | Status: AC
  Administered 2019-08-13: 2500 mg via INTRAVENOUS
  Filled 2019-08-13: qty 500

## 2019-08-13 MED ORDER — ATORVASTATIN CALCIUM 40 MG PO TABS
40.0000 mg | ORAL_TABLET | Freq: Every evening | ORAL | Status: DC
  Administered 2019-08-15 – 2019-08-16 (×2): 40 mg via ORAL
  Filled 2019-08-13 (×2): qty 1

## 2019-08-13 MED ORDER — PANTOPRAZOLE SODIUM 40 MG PO TBEC
40.0000 mg | DELAYED_RELEASE_TABLET | Freq: Every day | ORAL | Status: DC
  Administered 2019-08-14 – 2019-08-17 (×4): 40 mg via ORAL
  Filled 2019-08-13 (×4): qty 1

## 2019-08-13 MED ORDER — PIPERACILLIN-TAZOBACTAM 3.375 G IVPB
3.3750 g | Freq: Three times a day (TID) | INTRAVENOUS | Status: DC
  Administered 2019-08-13 – 2019-08-17 (×11): 3.375 g via INTRAVENOUS
  Filled 2019-08-13 (×13): qty 50

## 2019-08-13 MED ORDER — GABAPENTIN 300 MG PO CAPS
900.0000 mg | ORAL_CAPSULE | Freq: Two times a day (BID) | ORAL | Status: DC
  Administered 2019-08-14 – 2019-08-17 (×7): 900 mg via ORAL
  Filled 2019-08-13 (×7): qty 3

## 2019-08-13 MED ORDER — AMITRIPTYLINE HCL 100 MG PO TABS
100.0000 mg | ORAL_TABLET | Freq: Every day | ORAL | Status: DC
  Administered 2019-08-13 – 2019-08-16 (×4): 100 mg via ORAL
  Filled 2019-08-13 (×8): qty 1

## 2019-08-13 MED ORDER — GADOBENATE DIMEGLUMINE 529 MG/ML IV SOLN: 10.0000 mL | Freq: Once | INTRAVENOUS | Status: DC | PRN

## 2019-08-13 MED ORDER — INSULIN ASPART 100 UNIT/ML ~~LOC~~ SOLN
0.0000 [IU] | Freq: Three times a day (TID) | SUBCUTANEOUS | Status: DC
  Administered 2019-08-14: 3 [IU] via SUBCUTANEOUS
  Administered 2019-08-14: 2 [IU] via SUBCUTANEOUS
  Administered 2019-08-15 – 2019-08-16 (×2): 3 [IU] via SUBCUTANEOUS

## 2019-08-13 MED ORDER — SODIUM CHLORIDE 0.9 % IV SOLN
2.0000 g | INTRAVENOUS | Status: DC
  Filled 2019-08-13: qty 20

## 2019-08-13 MED ORDER — VANCOMYCIN HCL 10 G IV SOLR
1500.0000 mg | Freq: Two times a day (BID) | INTRAVENOUS | Status: DC
  Administered 2019-08-14 – 2019-08-17 (×7): 1500 mg via INTRAVENOUS
  Filled 2019-08-13 (×8): qty 1500

## 2019-08-13 MED ORDER — ACETAMINOPHEN 325 MG PO TABS
650.0000 mg | ORAL_TABLET | Freq: Four times a day (QID) | ORAL | Status: DC | PRN
  Administered 2019-08-13 – 2019-08-16 (×4): 650 mg via ORAL
  Filled 2019-08-13 (×4): qty 2

## 2019-08-13 MED ORDER — GADOBUTROL 1 MMOL/ML IV SOLN
10.0000 mL | Freq: Once | INTRAVENOUS | Status: AC | PRN
  Administered 2019-08-13: 10 mL via INTRAVENOUS

## 2019-08-13 MED ORDER — LISINOPRIL 20 MG PO TABS
20.0000 mg | ORAL_TABLET | Freq: Every day | ORAL | Status: DC
  Administered 2019-08-14 – 2019-08-17 (×4): 20 mg via ORAL
  Filled 2019-08-13 (×4): qty 1

## 2019-08-13 MED ORDER — DULOXETINE HCL 60 MG PO CPEP
60.0000 mg | ORAL_CAPSULE | Freq: Every day | ORAL | Status: DC
  Administered 2019-08-14 – 2019-08-17 (×4): 60 mg via ORAL
  Filled 2019-08-13 (×4): qty 1

## 2019-08-13 MED ORDER — FUROSEMIDE 20 MG PO TABS
20.0000 mg | ORAL_TABLET | Freq: Every day | ORAL | Status: DC
  Administered 2019-08-14 – 2019-08-17 (×4): 20 mg via ORAL
  Filled 2019-08-13 (×4): qty 1

## 2019-08-13 MED ORDER — VANCOMYCIN HCL IN DEXTROSE 1-5 GM/200ML-% IV SOLN: 1000.0000 mg | Freq: Once | INTRAVENOUS | Status: DC

## 2019-08-13 MED ORDER — DOCUSATE SODIUM 100 MG PO CAPS
100.0000 mg | ORAL_CAPSULE | Freq: Every day | ORAL | Status: DC | PRN
  Administered 2019-08-16: 100 mg via ORAL
  Filled 2019-08-13: qty 1

## 2019-08-13 MED ORDER — LORAZEPAM 1 MG PO TABS: 1.0000 mg | ORAL_TABLET | ORAL | Status: AC | PRN

## 2019-08-13 MED ORDER — GABAPENTIN 400 MG PO CAPS
1200.0000 mg | ORAL_CAPSULE | Freq: Every day | ORAL | Status: DC
  Administered 2019-08-13 – 2019-08-16 (×4): 1200 mg via ORAL
  Filled 2019-08-13 (×5): qty 3

## 2019-08-13 MED ORDER — PRIMIDONE 50 MG PO TABS
50.0000 mg | ORAL_TABLET | Freq: Two times a day (BID) | ORAL | Status: DC
  Administered 2019-08-13 – 2019-08-17 (×8): 50 mg via ORAL
  Filled 2019-08-13 (×11): qty 1

## 2019-08-13 NOTE — Progress Notes (Signed)
   Vital Signs MEWS/VS Documentation       08/13/2019 1836 08/13/2019 1836       MEWS Score:  3  4     MEWS Score Color:  Yellow  Red     Resp:  16  --     Pulse:  (!) 115  (!) 115     BP:  128/76  --     Temp:  (!) 101 F (38.3 C)  (!) 103 F (39.4 C)     O2 Device:  Room Air  --     O2 Flow Rate (L/min):  0 L/min  --           Patient direct admit from office today. Arrived at 63 through ED admitting. RN met patient at [employer] and assisted patient to room.  Will implement frequent vitals and alert MD of patient vital signs.    Will continue to monitor patient status  Franki Monte 08/13/2019,6:53 PM

## 2019-08-13 NOTE — Progress Notes (Signed)
Pharmacy Antibiotic Note  LENIS MOHL is a 53 y.o. male admitted on 08/13/2019 with cellulitis.  Pharmacy has been consulted for vancomycin dosing.  Plan: Vanc 2500mg  IV x 1 loading dose then start maintenance dose of 1500mg  IV q12 - goal AUC 400-550   Weight: 287 lb (130.2 kg)  Temp (24hrs), Avg:102 F (38.9 C), Min:101 F (38.3 C), Max:103 F (39.4 C)  Recent Labs  Lab 08/13/19 1905  WBC 7.4  CREATININE 0.95    Estimated Creatinine Clearance: 127.5 mL/min (by C-G formula based on SCr of 0.95 mg/dL).    No Known Allergies   Thank you for allowing pharmacy to be a part of this patient's care.  Kara Mead 08/13/2019 7:46 PM

## 2019-08-13 NOTE — H&P (Addendum)
History and Physical    Eugene Diaz [id number] DOB: 19-Aug-1966 DOA: 08/13/2019  PCP: [name], PA-C  Patient coming from: Direct admission from [facility] [name]  I have personally briefly reviewed patient's old medical records in [place]  Chief Complaint: Left foot cellulitis and suspected osteomyeltis  HPI: Eugene Diaz is a 53 y.o. male with medical history significant of type 2 diabetes with peripheral neuropathy, hypertension, hyperlipidemia who presented as a direct admission from [facility] for concerns of left foot cellulitis and osteomyelitis. Patient is s/p an open reduction, perc pinning and incision and drainage of a recent closed displaced fracture of the proximal phalanx of the left great toe on 07/16/2019.  He was seen outpatient at the podiatry office on 10/9 and his foot surgery at that point appeared to be progressing as expected.  However, today he presented to his podiatry office and was evaluted by [name] and noted to have left foot cellulitis and suspected osteomyelitis on foot x-ray.  Unable to view report of this foot x-ray on Epic.  Patient denies any worsening pain due to his neuropathy.  Denies any fevers or chills prior to admission.  Denies any changes in appetite or any nausea, vomiting or diarrhea.  Denies any chest pain or shortness of breath.  On presentation patient was febrile up to 103 and tachycardic up to 115 on room air.  CBC showed no leukocytosis but had anemia of 11.3 from a prior of normal 3 weeks ago.  Platelets also decreased to 137.  CMP showed low sodium of 130s with blood glucose of 136.  Stable and normal creatinine.  Lactic acid of 1.  Blood cultures are pending.   Review of Systems:  Constitutional: No Weight Change, No Fever ENT/Mouth: No sore throat, No Rhinorrhea Eyes: No Eye Pain, No Vision Changes Cardiovascular: No Chest Pain, no SOB Respiratory: No Cough, No Sputum, No Wheezing, no  Dyspnea  Gastrointestinal: No Nausea, No Vomiting, No Diarrhea, No Constipation, No Pain Genitourinary: no Urinary Incontinence, No Urgency, No Flank Pain Musculoskeletal: No Arthralgias, No Myalgias Skin: No Skin Lesions, No Pruritus, Neuro: no Weakness, No Numbness,  No Loss of Consciousness, No Syncope Psych: No Anxiety/Panic, No Depression, no decrease appetite Heme/Lymph: No Bruising, No Bleeding  Past Medical History:  Diagnosis Date  . Anxiety   . Arthritis   . Asthma   . Depression   . Diabetes mellitus without complication ([place]) 0000000  . Hypercholesterolemia   . Hypertension   . Neuropathy   . Peripheral vascular disease [facility])     Past Surgical History:  Procedure Laterality Date  . BIOPSY  02/13/2018   Procedure: BIOPSY;  Surgeon: Danie Binder, MD;  [facility];  Service: Endoscopy;;  transverse colon biopsy, gastric biopsy  . COLONOSCOPY WITH PROPOFOL N/A 02/13/2018   Procedure: COLONOSCOPY WITH PROPOFOL;  Surgeon: Danie Binder, MD;  [facility];  Service: Endoscopy;  Laterality: N/A;  11:15am  . ESOPHAGOGASTRODUODENOSCOPY (EGD) WITH PROPOFOL N/A 02/13/2018   Procedure: ESOPHAGOGASTRODUODENOSCOPY (EGD) WITH PROPOFOL;  Surgeon: Danie Binder, MD;  [facility];  Service: Endoscopy;  Laterality: N/A;  . I&D EXTREMITY Left 07/16/2019   Procedure: Insicion  AND DEBRIDEMENT EXTREMITY;  Surgeon: Evelina Bucy, DPM;  Location: [place];  Service: Podiatry;  Laterality: Left;  . Left arm     Left arm repair (tendon and artery)  . PERCUTANEOUS PINNING  07/16/2019  Procedure: Open Reduction Percutaneous Pinning Extremity;  Surgeon: Evelina Bucy, DPM;  Location: [place];  Service: Podiatry;;  . PILONIDAL CYST EXCISION N/A 08/08/2014   Procedure: CYST EXCISION PILONIDAL EXTENSIVE;  Surgeon: Jamesetta So, MD;  Location[facility];  Service: General;  Laterality: N/A;  . POLYPECTOMY  02/13/2018   Procedure: POLYPECTOMY;  Surgeon: Danie Binder, MD;  [facility];  Service: Endoscopy;;  transverse colon polyp hs, rectal polyps times 2  . SAVORY DILATION N/A 02/13/2018   Procedure: SAVORY DILATION;  Surgeon: Danie Binder, MD;  [facility];  Service: Endoscopy;  Laterality: N/A;     reports that he quit smoking about 35 years ago. His smoking use included cigarettes. He has a 62.50 pack-year smoking history. He has never used smokeless tobacco. He reports current alcohol use of about 50.0 standard drinks of alcohol per week. He reports current drug use. Drug: Marijuana.   Endorse 6-7 drinks of beer daily.   No Known Allergies  Family History  Problem Relation Age of Onset  . Heart attack Mother        Living, 56  . Cancer Father        Deceased  . Healthy Brother   . Healthy Sister   . Colon cancer Neg Hx   . Colon polyps Neg Hx   : Family history reviewed and not pertinent   Prior to Admission medications   Medication Sig Start Date End Date Taking? Authorizing Provider  albuterol (PROVENTIL HFA;VENTOLIN HFA) 108 (90 Base) MCG/ACT inhaler Inhale 2 puffs into the lungs every 6 (six) hours as needed for wheezing or shortness of breath. 10/31/18  Yes Hayden Rasmussen, MD  amitriptyline (ELAVIL) 100 MG tablet Take 100 mg by mouth at bedtime.   Yes [provider]  atorvastatin (LIPITOR) 40 MG tablet Take 40 mg by mouth every evening.  12/17/15  Yes [provider]  canagliflozin (INVOKANA) 300 MG TABS tablet 300 mg daily before breakfast.  11/16/15  Yes [provider]  docusate sodium (COLACE) 100 MG capsule Take 100 mg by mouth daily as needed for mild constipation.   Yes [provider]  DULoxetine (CYMBALTA) 60 MG capsule Take 60 mg by mouth daily.  12/11/15  Yes [provider]  furosemide (LASIX) 20 MG tablet Take 20 mg by mouth daily.    Yes [provider]  gabapentin (NEURONTIN) 300 MG capsule Take 900mg  in the morning and at noon, then  take 1200mg  at bedtime 06/14/19  Yes Patel, Donika K, DO  glycopyrrolate (ROBINUL) 1 MG tablet Take 1 mg by mouth 2 (two) times daily.  12/17/15  Yes [provider]  lidocaine (XYLOCAINE) 5 % ointment APPLY TO AFFECTED AREAS TWICE DAILY AS NEEDED. 04/04/19  Yes Patel, Donika K, DO  lisinopril (PRINIVIL,ZESTRIL) 20 MG tablet Take 20 mg by mouth daily.  12/30/15  Yes [provider]  omeprazole (PRILOSEC) 20 MG capsule Take 1 capsule (20 mg total) by mouth 2 (two) times daily before a meal. 1 PO 30 mins prior to breakfast and supper 03/05/19  Yes Mahala Menghini, PA-C  oxyCODONE HCl 30 MG TABA Take 30 mg by mouth every 4 (four) hours as needed (for pain).  12/07/15  Yes [provider]  primidone (MYSOLINE) 50 MG tablet Take 1 tablet (50 mg total) by mouth 2 (two) times daily. 06/14/19  Yes Patel, Donika K, DO  VOLTAREN 1 % GEL Apply 2 g topically  4 (four) times daily.  08/19/15  Yes [provider]  doxycycline (VIBRAMYCIN) 100 MG capsule Take 1 capsule (100 mg total) by mouth 2 (two) times daily. Patient not taking: Reported on 08/13/2019 07/18/19   Thurnell Lose, MD    Physical Exam: Vitals:   08/13/19 1836 08/13/19 1836 08/13/19 1900  BP: 128/76    Pulse: (!) 115 (!) 115   Resp: 16    Temp: (!) 101 F (38.3 C) (!) 103 F (39.4 C)   TempSrc: Oral Oral   SpO2: 95% 96%   Weight:   130.2 kg    Constitutional: NAD, calm, comfortable, obese well appearing gentleman sitting up in bed and eating a large dinner Vitals:   08/13/19 1836 08/13/19 1836 08/13/19 1900  BP: 128/76    Pulse: (!) 115 (!) 115   Resp: 16    Temp: (!) 101 F (38.3 C) (!) 103 F (39.4 C)   TempSrc: Oral Oral   SpO2: 95% 96%   Weight:   130.2 kg   Eyes: PERRL, lids and conjunctivae normal ENMT: Mucous membranes are moist. Posterior pharynx clear of any exudate or lesions. Neck: normal, supple, no masses. Respiratory: clear to auscultation bilaterally, no wheezing, no crackles. Normal  respiratory effort. No accessory muscle use.  Cardiovascular: Regular rate and rhythm, no murmurs / rubs / gallops. No extremity edema.  Difficult to obtain pedal pulse due to foot edema. Abdomen: no tenderness, no masses palpated. Bowel sounds positive.  Musculoskeletal: no clubbing / cyanosis.  Blanching erythema and increased warmth of the left lower extremity pretibial region with +3 pitting edema down to the left foot with few scattered areas of fluctuant likely abscess.  2 pin wires exposed at the distal end of the left great toe.  There is a opened 2 to 3 cm wound without overlaying erythema or edema with deep packing on the medial side of the left great toe.  Patient with limited range of motion of the left great toe secondary to pinning due to recent fracture.  There is also healing ulcer to the left lateral midfoot.  Patient with decreased sensation throughout lower extremities secondary to neuropathy.  Skin: no rashes, lesions, ulcers. No induration Neurologic: CN 2-12 grossly intact. Sensation intact, DTR normal. Strength 5/5 in all 4.  Psychiatric: Normal judgment and insight. Alert and oriented x 3. Normal mood.    Labs on Admission: I have personally reviewed following labs and imaging studies  CBC: Recent Labs  Lab 08/13/19 1905  WBC 7.4  HGB 11.3*  HCT 33.1*  MCV 93.0  PLT 0000000*   Basic Metabolic Panel: Recent Labs  Lab 08/13/19 1905  NA 130*  K 3.5  CL 96*  CO2 23  GLUCOSE 136*  BUN 15  CREATININE 0.95  CALCIUM 8.8*   GFR: Estimated Creatinine Clearance: 127.5 mL/min (by C-G formula based on SCr of 0.95 mg/dL). Liver Function Tests: Recent Labs  Lab 08/13/19 1905  AST 16  ALT 13  ALKPHOS 73  BILITOT 0.9  PROT 6.9  ALBUMIN 3.2*   No results for input(s): LIPASE, AMYLASE in the last 168 hours. No results for input(s): AMMONIA in the last 168 hours. Coagulation Profile: No results for input(s): INR, PROTIME in the last 168 hours. Cardiac Enzymes: No  results for input(s): CKTOTAL, CKMB, CKMBINDEX, TROPONINI in the last 168 hours. BNP (last 3 results) No results for input(s): PROBNP in the last 8760 hours. HbA1C: No results for input(s): HGBA1C in the last 72  hours. CBG: No results for input(s): GLUCAP in the last 168 hours. Lipid Profile: No results for input(s): CHOL, HDL, LDLCALC, TRIG, CHOLHDL, LDLDIRECT in the last 72 hours. Thyroid Function Tests: No results for input(s): TSH, T4TOTAL, FREET4, T3FREE, THYROIDAB in the last 72 hours. Anemia Panel: No results for input(s): VITAMINB12, FOLATE, FERRITIN, TIBC, IRON, RETICCTPCT in the last 72 hours. Urine analysis:    Component Value Date/Time   COLORURINE YELLOW 05/21/2018 1611   APPEARANCEUR CLEAR 05/21/2018 1611   LABSPEC 1.029 05/21/2018 1611   PHURINE 5.0 05/21/2018 1611   GLUCOSEU >=500 (A) 05/21/2018 1611   HGBUR NEGATIVE 05/21/2018 1611   BILIRUBINUR NEGATIVE 05/21/2018 1611   KETONESUR NEGATIVE 05/21/2018 1611   PROTEINUR NEGATIVE 05/21/2018 1611   UROBILINOGEN 4.0 (H) 10/09/2008 1114   NITRITE NEGATIVE 05/21/2018 1611   LEUKOCYTESUR NEGATIVE 05/21/2018 1611    Radiological Exams on Admission: No results found.    Assessment/Plan  Sepsis secondary left foot cellulitis and suspected osteomyelitis -Patient direct admission from podiatry office.  He is status post open reduction and pinning of his left great toe due to a fracture on 9/15. -Foot x-ray in office today apparently showed suspected osteomyelitis.  Unable to view this in epic. -Patient was tachycardic and febrile on arrival. -Will obtain MRI of the left foot given suspicion for osteomyelitis -Will start on empiric antibiotics with IV Zosyn and vancomycin -Blood cultures pending -Continue to follow fever trend - wound care appreciated per RN -Consult podiatry for potential amputation    Acute anemia -Hemoglobin of 11.3 on admission from a prior normal -No active signs of bleeding -Will check iron  panel, folate and vitamin B12  Thrombocytopenia -Could be secondary to heavy alcohol use. -will monitor with CBC  Hyponatremia -Corrected sodium of 131 on admission -Likely secondary to alcohol use -Follow BMP after IV fluids  Alcohol use with alcoholic-induced peripheral neuropathy -Patient reports drinking 6-7 beers daily.  Last drink prior to admission. -Continue to monitor vitals for any signs of withdrawal.  Will place on CIWA protocol. - continue primidone for alcohol induced tremors   Type 2 diabetes -Last hemoglobin A1c in September of 6.6 -Place on low-dose sliding scale -Continue amitriptyline and duloxetine -Continue gabapentin  Hyperlipidemia -Continue atorvastatin  Hypertension -Continue Lasix and lisinopril   DVT prophylaxis:SCD Code Status:Full Family Communication: Plan discussed with patient at bedside  disposition Plan: Home with at least 2 midnight stays  Consults called: Ortho Admission status: inpatient   Eugene Harvie T Nicholad Kautzman DO [facility]   If 7PM-7AM, please contact night-coverage [email or web address] Password [facility]  08/13/2019, 8:18 PM

## 2019-08-13 NOTE — Telephone Encounter (Signed)
[facility] TELEPHONE ENCOUNTER NOTE  Patient: Eugene Diaz [id number]   PCP: [name], PA-C DOB: 1966-05-09   DOS: 08/13/2019     Received a call from [name] requesting medical admission for this patient. Patient has history of diabetes and dyslipidemia.  Presents with ankle fracture that was treated outpatient.  Now appears to have cellulitis and abscess of the fracture site with evidence of osteomyelitis on clinical examination per [name]. Patient in no apparent distress. [name] recommending admission to telemetry for IV antibiotics and possible amputation. They will continue to follow-up the patient in the hospital. Patient accepted on telemetry at [facility] long hospital.  Author:  Berle Mull, MD [facility] 08/13/2019  If 7PM-7AM, please contact night-coverage To reach On-call, see [email or web address]

## 2019-08-13 NOTE — Telephone Encounter (Addendum)
Dawn - Pt Placement Bed states she has contacted pt and he knows he needs to report to [facility] and is assigned [phone].

## 2019-08-13 NOTE — Telephone Encounter (Signed)
I called Patient Placement Bed - Eugene Diaz and informed of Dr. Eleanora Neighbor orders for [facility] bed with orthopedic prep, pt to be admitted by Dr. Marlowe Sax with diagnosis of left great toe osteomyelitis and cellulitis. Eugene Diaz states they do not have beds available currently and will give our office a call once available.

## 2019-08-13 NOTE — Telephone Encounter (Signed)
I informed pt of the [facility] wait for admitting bed and that I would call him once I received Elvina Sidle information and I gave pt my business card. [name] okay pt to go home to pack a hospital stay bag.

## 2019-08-13 NOTE — Telephone Encounter (Signed)
Pt called requesting that we refill his prescription for Doxycycline.  Pt states he is headed to the hospital to be admitted but will need the medication when he is released.

## 2019-08-13 NOTE — Telephone Encounter (Signed)
[name] ordered contact Pt Placement for direct admit of pt to [facility]. I spoke with Maudie Mercury - Patient Placement and she states Dr. [name] would contact [name] for direct admit to [facility].

## 2019-08-13 NOTE — Progress Notes (Signed)
Pharmacy Antibiotic Note  Eugene Diaz is a 53 y.o. male admitted on 08/13/2019 with LLE cellulitis  & suspected osteomyelitis per foot Xray.  PMH significant for DM and L great toe fracture on 07/16/19 s/p open reduction & pinning.   Pharmacy has been consulted for Vancomycin & Zosyn dosing.  08/13/2019:  Tm 103F  WBC, LA WNL  Scr 0.95 - patient's baseline  MRI of foot pending to confirm dx of osteo  Plan: Zosyn 3.375g IV q8h (4 hour infusion).  Continue Vancomycin 1500mg  IV q12h to target AUC 400-550 Daily Scr Monitor renal function and cx data    Weight: 287 lb (130.2 kg)  Temp (24hrs), Avg:100.8 F (38.2 C), Min:98.5 F (36.9 C), Max:103 F (39.4 C)  Recent Labs  Lab 08/13/19 1905 08/13/19 1920  WBC 7.4  --   CREATININE 0.95  --   LATICACIDVEN  --  1.0    Estimated Creatinine Clearance: 127.5 mL/min (by C-G formula based on SCr of 0.95 mg/dL).    No Known Allergies  Antimicrobials this admission: 10/13 Vanc >>  10/14 Zosyn >>   Dose adjustments this admission:  Microbiology results: 10/13 BCx:  10/13 COVID:   Thank you for allowing pharmacy to be a part of this patient's care.  Netta Cedars, PharmD, BCPS 08/13/2019 11:25 PM

## 2019-08-13 NOTE — Telephone Encounter (Signed)
I called Pt Placement Bed - Dawn states they are having a meeting to place pt and should be placed before 5:00pm maybe in the next 10 minutes, and will call our office and pt with the information.

## 2019-08-14 ENCOUNTER — Encounter (HOSPITAL_COMMUNITY)
Admission: AD | Disposition: A | Discharge status: Home / Self Care | Payer: Self-pay | Source: Ambulatory Visit | Attending: Internal Medicine

## 2019-08-14 ENCOUNTER — Encounter (HOSPITAL_COMMUNITY): Payer: Self-pay

## 2019-08-14 ENCOUNTER — Inpatient Hospital Stay (HOSPITAL_COMMUNITY): Payer: Medicaid Other | Admitting: Certified Registered Nurse Anesthetist

## 2019-08-14 ENCOUNTER — Inpatient Hospital Stay (HOSPITAL_COMMUNITY): Payer: Medicaid Other

## 2019-08-14 DIAGNOSIS — M86272 Subacute osteomyelitis, left ankle and foot: Secondary | ICD-10-CM

## 2019-08-14 DIAGNOSIS — M86172 Other acute osteomyelitis, left ankle and foot: Secondary | ICD-10-CM | POA: Diagnosis present

## 2019-08-14 DIAGNOSIS — E871 Hypo-osmolality and hyponatremia: Secondary | ICD-10-CM | POA: Diagnosis present

## 2019-08-14 DIAGNOSIS — D649 Anemia, unspecified: Secondary | ICD-10-CM | POA: Diagnosis present

## 2019-08-14 DIAGNOSIS — A419 Sepsis, unspecified organism: Secondary | ICD-10-CM | POA: Diagnosis present

## 2019-08-14 DIAGNOSIS — E1169 Type 2 diabetes mellitus with other specified complication: Secondary | ICD-10-CM | POA: Diagnosis present

## 2019-08-14 DIAGNOSIS — M86271 Subacute osteomyelitis, right ankle and foot: Secondary | ICD-10-CM

## 2019-08-14 DIAGNOSIS — M869 Osteomyelitis, unspecified: Secondary | ICD-10-CM | POA: Diagnosis present

## 2019-08-14 DIAGNOSIS — E538 Deficiency of other specified B group vitamins: Secondary | ICD-10-CM | POA: Diagnosis present

## 2019-08-14 HISTORY — PX: AMPUTATION TOE: SHX6595

## 2019-08-14 LAB — URINALYSIS, ROUTINE W REFLEX MICROSCOPIC
Bilirubin Urine: NEGATIVE
Glucose, UA: NEGATIVE mg/dL
Hgb urine dipstick: NEGATIVE
Ketones, ur: NEGATIVE mg/dL
Leukocytes,Ua: NEGATIVE
Nitrite: NEGATIVE
Protein, ur: NEGATIVE mg/dL
Specific Gravity, Urine: 1.02 (ref 1.005–1.030)
pH: 5 (ref 5.0–8.0)

## 2019-08-14 LAB — CBC
HCT: 32.5 % — ABNORMAL LOW (ref 39.0–52.0)
Hemoglobin: 10.6 g/dL — ABNORMAL LOW (ref 13.0–17.0)
MCH: 31.6 pg (ref 26.0–34.0)
MCHC: 32.6 g/dL (ref 30.0–36.0)
MCV: 97 fL (ref 80.0–100.0)
Platelets: 119 10*3/uL — ABNORMAL LOW (ref 150–400)
RBC: 3.35 MIL/uL — ABNORMAL LOW (ref 4.22–5.81)
RDW: 12.9 % (ref 11.5–15.5)
WBC: 5.6 10*3/uL (ref 4.0–10.5)
nRBC: 0 % (ref 0.0–0.2)

## 2019-08-14 LAB — BASIC METABOLIC PANEL
Anion gap: 9 (ref 5–15)
BUN: 18 mg/dL (ref 6–20)
CO2: 25 mmol/L (ref 22–32)
Calcium: 8.4 mg/dL — ABNORMAL LOW (ref 8.9–10.3)
Chloride: 99 mmol/L (ref 98–111)
Creatinine, Ser: 0.99 mg/dL (ref 0.61–1.24)
GFR calc Af Amer: >60 mL/min (ref >60)
GFR calc non Af Amer: >60 mL/min (ref >60)
Glucose, Bld: 134 mg/dL — ABNORMAL HIGH (ref 70–99)
Potassium: 3.2 mmol/L — ABNORMAL LOW (ref 3.5–5.1)
Sodium: 133 mmol/L — ABNORMAL LOW (ref 135–145)

## 2019-08-14 LAB — MRSA PCR SCREENING: MRSA by PCR: NEGATIVE

## 2019-08-14 LAB — GLUCOSE, CAPILLARY
Glucose-Capillary: 153 mg/dL — ABNORMAL HIGH (ref 70–99)
Glucose-Capillary: 137 mg/dL — ABNORMAL HIGH (ref 70–99)
Glucose-Capillary: 190 mg/dL — ABNORMAL HIGH (ref 70–99)
Glucose-Capillary: 185 mg/dL — ABNORMAL HIGH (ref 70–99)
Glucose-Capillary: 118 mg/dL — ABNORMAL HIGH (ref 70–99)

## 2019-08-14 LAB — SARS CORONAVIRUS 2 (TAT 6-24 HRS): SARS Coronavirus 2: NEGATIVE

## 2019-08-14 LAB — MAGNESIUM: Magnesium: 1.8 mg/dL (ref 1.7–2.4)

## 2019-08-14 SURGERY — AMPUTATION, TOE
Anesthesia: Monitor Anesthesia Care | Site: Toe | Laterality: Left

## 2019-08-14 MED ORDER — ENSURE PRE-SURGERY PO LIQD
296.0000 mL | Freq: Once | ORAL | Status: AC
  Administered 2019-08-14: 296 mL via ORAL
  Filled 2019-08-14: qty 296

## 2019-08-14 MED ORDER — FENTANYL CITRATE (PF) 100 MCG/2ML IJ SOLN: 25.0000 ug | INTRAMUSCULAR | Status: DC | PRN

## 2019-08-14 MED ORDER — ONDANSETRON HCL 4 MG/2ML IJ SOLN
INTRAMUSCULAR | Status: DC | PRN
  Administered 2019-08-14: 4 mg via INTRAVENOUS

## 2019-08-14 MED ORDER — ONDANSETRON HCL 4 MG/2ML IJ SOLN: 4.0000 mg | Freq: Four times a day (QID) | INTRAMUSCULAR | Status: DC | PRN

## 2019-08-14 MED ORDER — PROPOFOL 10 MG/ML IV BOLUS
INTRAVENOUS | Status: AC
  Filled 2019-08-14: qty 20

## 2019-08-14 MED ORDER — BUPIVACAINE HCL (PF) 0.5 % IJ SOLN
INTRAMUSCULAR | Status: AC
  Filled 2019-08-14: qty 30

## 2019-08-14 MED ORDER — MIDAZOLAM HCL 2 MG/2ML IJ SOLN
INTRAMUSCULAR | Status: AC
  Filled 2019-08-14: qty 2

## 2019-08-14 MED ORDER — LACTATED RINGERS IV SOLN
INTRAVENOUS | Status: DC | PRN
  Administered 2019-08-14: 16:00:00 via INTRAVENOUS

## 2019-08-14 MED ORDER — VITAMIN B-12 1000 MCG PO TABS
1000.0000 ug | ORAL_TABLET | Freq: Every day | ORAL | Status: DC
  Administered 2019-08-15 – 2019-08-17 (×3): 1000 ug via ORAL
  Filled 2019-08-14 (×4): qty 1

## 2019-08-14 MED ORDER — ONDANSETRON HCL 4 MG/2ML IJ SOLN
INTRAMUSCULAR | Status: AC
  Filled 2019-08-14: qty 2

## 2019-08-14 MED ORDER — FENTANYL CITRATE (PF) 100 MCG/2ML IJ SOLN
INTRAMUSCULAR | Status: DC | PRN
  Administered 2019-08-14: 50 ug via INTRAVENOUS

## 2019-08-14 MED ORDER — PROPOFOL 10 MG/ML IV BOLUS
INTRAVENOUS | Status: DC | PRN
  Administered 2019-08-14 (×13): 10 mg via INTRAVENOUS

## 2019-08-14 MED ORDER — OXYCODONE HCL 5 MG PO TABS: 5.0000 mg | ORAL_TABLET | Freq: Once | ORAL | Status: DC | PRN

## 2019-08-14 MED ORDER — MIDAZOLAM HCL 5 MG/5ML IJ SOLN
INTRAMUSCULAR | Status: DC | PRN
  Administered 2019-08-14: 2 mg via INTRAVENOUS

## 2019-08-14 MED ORDER — 0.9 % SODIUM CHLORIDE (POUR BTL) OPTIME
TOPICAL | Status: DC | PRN
  Administered 2019-08-14: 1000 mL

## 2019-08-14 MED ORDER — BUPIVACAINE HCL (PF) 0.5 % IJ SOLN
INTRAMUSCULAR | Status: DC | PRN
  Administered 2019-08-14: 10 mL

## 2019-08-14 MED ORDER — FENTANYL CITRATE (PF) 100 MCG/2ML IJ SOLN
INTRAMUSCULAR | Status: AC
  Filled 2019-08-14: qty 2

## 2019-08-14 MED ORDER — OXYCODONE HCL 5 MG/5ML PO SOLN: 5.0000 mg | Freq: Once | ORAL | Status: DC | PRN

## 2019-08-14 MED ORDER — LACTATED RINGERS IV SOLN: INTRAVENOUS | Status: DC

## 2019-08-14 SURGICAL SUPPLY — 39 items
BLADE SURG 15 STRL LF DISP TIS (BLADE) ×1 IMPLANT
BLADE SURG 15 STRL SS (BLADE) ×2
BNDG CMPR 82X61 PLY HI ABS (GAUZE/BANDAGES/DRESSINGS) ×1
BNDG CONFORM 6X.82 1P STRL (GAUZE/BANDAGES/DRESSINGS) ×1 IMPLANT
BNDG ELASTIC 4X5.8 VLCR STR LF (GAUZE/BANDAGES/DRESSINGS) ×2 IMPLANT
BNDG ELASTIC 6X5.8 VLCR STR LF (GAUZE/BANDAGES/DRESSINGS) ×2 IMPLANT
CLEANER TIP ELECTROSURG 2X2 (MISCELLANEOUS) ×2 IMPLANT
CONT SPEC 4OZ CLIKSEAL STRL BL (MISCELLANEOUS) ×2 IMPLANT
COVER MAYO STAND STRL (DRAPES) ×2 IMPLANT
COVER SURGICAL LIGHT HANDLE (MISCELLANEOUS) ×2 IMPLANT
COVER WAND RF STERILE (DRAPES) IMPLANT
CUFF TOURN SGL QUICK 24 (TOURNIQUET CUFF) ×2
CUFF TRNQT CYL 24X4X16.5-23 (TOURNIQUET CUFF) ×1 IMPLANT
DECANTER SPIKE VIAL GLASS SM (MISCELLANEOUS) ×2 IMPLANT
GAUZE SPONGE 4X4 12PLY STRL (GAUZE/BANDAGES/DRESSINGS) ×4 IMPLANT
GLOVE BIO SURGEON STRL SZ7.5 (GLOVE) ×2 IMPLANT
GLOVE BIOGEL PI IND STRL 8 (GLOVE) ×1 IMPLANT
GLOVE BIOGEL PI INDICATOR 8 (GLOVE) ×1
GOWN STRL REUS W/TWL XL LVL3 (GOWN DISPOSABLE) ×2 IMPLANT
HEMOSTAT SURGICEL 2X3 (HEMOSTASIS) ×1 IMPLANT
KIT BASIN OR (CUSTOM PROCEDURE TRAY) ×2 IMPLANT
KIT TURNOVER KIT A (KITS) IMPLANT
MARKER SKIN DUAL TIP RULER LAB (MISCELLANEOUS) ×2 IMPLANT
NDL HYPO 25X1 1.5 SAFETY (NEEDLE) ×1 IMPLANT
NEEDLE HYPO 25X1 1.5 SAFETY (NEEDLE) ×2 IMPLANT
PACK ORTHO EXTREMITY (CUSTOM PROCEDURE TRAY) ×2 IMPLANT
PADDING UNDERCAST 2 STRL (CAST SUPPLIES) ×1
PADDING UNDERCAST 2X4 STRL (CAST SUPPLIES) ×1 IMPLANT
SUT ETHILON 3 0 PS 1 (SUTURE) ×1 IMPLANT
SUT MNCRL AB 3-0 PS2 18 (SUTURE) ×1 IMPLANT
SUT PROLENE 4 0 PS 2 18 (SUTURE) ×2 IMPLANT
SUT VIC AB 1 CT1 27 (SUTURE) ×2
SUT VIC AB 1 CT1 27XBRD ANTBC (SUTURE) ×1 IMPLANT
SYR 20ML LL LF (SYRINGE) ×2 IMPLANT
SYSTEM CHEST DRAIN TLS 7FR (DRAIN) ×1 IMPLANT
TOWEL OR 17X26 10 PK STRL BLUE (TOWEL DISPOSABLE) ×2 IMPLANT
TOWEL OR NON WOVEN STRL DISP B (DISPOSABLE) ×2 IMPLANT
TRAY PREP A LATEX SAFE STRL (SET/KITS/TRAYS/PACK) ×2 IMPLANT
UNDERPAD 30X36 HEAVY ABSORB (UNDERPADS AND DIAPERS) ×8 IMPLANT

## 2019-08-14 NOTE — Telephone Encounter (Signed)
We will likely change his medication at discharge and address then

## 2019-08-14 NOTE — Progress Notes (Signed)
Oxycodone and xanax found in patient's shorts on prior shift. Taken to pharmacy for lock up at 19:58.

## 2019-08-14 NOTE — Transfer of Care (Signed)
Immediate Anesthesia Transfer of Care Note  Patient: Eugene Diaz  Procedure(s) Performed: AMPUTATION GREAT TOE (Left Toe)  Patient Location: PACU  Anesthesia Type:MAC  Level of Consciousness: drowsy and patient cooperative  Airway & Oxygen Therapy: Patient Spontanous Breathing and Patient connected to face mask oxygen  Post-op Assessment: Report given to RN and Post -op Vital signs reviewed and stable  Post vital signs: Reviewed and stable  Last Vitals:  Vitals Value Taken Time  BP 111/70 08/14/19 1703  Temp    Pulse 73 08/14/19 1704  Resp 15 08/14/19 1706  SpO2 100 % 08/14/19 1704  Vitals shown include unvalidated device data.  Last Pain:  Vitals:   08/14/19 1544  TempSrc: Oral  PainSc:       Patients Stated Pain Goal: 0 (25/36/64 4034)  Complications: No apparent anesthesia complications

## 2019-08-14 NOTE — Progress Notes (Signed)
[facility]  PROGRESS NOTE  CHAEL DEVILLIER [id number] DOB: [birth year] DOA: 08/13/2019 PCP: [name], PA-C  Brief History    JENIEL BOYETTE is a 53 y.o. year old male with medical history significant for ype 2 diabetes with peripheral neuropathy, hypertension, hyperlipidemia who presented on 08/13/2019 is a direct admission from [facility] podiatry office due to concern for left foot cellulitis and possible osteomyelitis based off x-ray of foot in office.  Patient is status post open reduction/pinning incision and drainage of a recent close displaced fracture of the left great toe (07/16/2019) seen in podiatry office on 10/9.  On arrival to [facility] long patient with fever of 103, tachycardic to 115 And empirically started on IV Zosyn and vancomycin while awaiting MRI to rule out osteomyelitis. A & P    Sepsis secondary to left great toe osteomyelitis.  Presented with tachycardia, T-max 103 and high concern for osteomyelitis based on outpatient imaging.  MRI confirms OM.  Podiatry plans to take to the OR today, continue IV vancomycin and Zosyn.  Monitor blood cultures, fever curve, white count.   Alcohol abuse.  Admits to drinking approximately 6 to 712 ounce beers daily, last drink on day of admission.  No hypertension, no hallucinations but patient is quite somnolent as well as some increased perspiration.  This could be related to infection will closely monitor for CIWA protocol.   Normocytic anemia.  Tends to fluctuate between 11 and 13 in the past month.  Denies any active bleeding.  B12 low, iron panel consistent with anemia of chronic disease.  Replete B12 once returns from surgery.   Chronic mild hyponatremia, improving.  Currently 133 oh ( up from 130 on admission).  Remains asymptomatic, closely monitor.    Chronic thrombocytopenia.  Currently 118.  No active signs of bleeding.  Suspect related to alcohol use, repeat CBC in a.m.   Type 2 diabetes.  A1c 6.6  (07/2019)Hold home oral hypoglycemics, monitor CBG, sliding scale as needed   Hyperlipidemia, stable.  Continue Lipitor   Peripheral neuropathy, continue Cymbalta, continue gabapentin   Depression continue Elavil   Hypertension, stable.  Continue lisinopril   GERD, stable continue PPI     DVT prophylaxis: SCDs Code Status: Full Family Communication: No family at bedside Disposition Plan: Plan for OR to address left great toe osteomyelitis, continue IV vancomycin and Zosyn, monitor blood cultures.     [facility] Direct contact: see [email or web address] (further directions at bottom of note if needed) 7PM-7AM contact night coverage as at bottom of note 08/14/2019, 4:26 PM  LOS: 1 day   Consultants   Podiatry  Procedures   Left great toe amputation/debridement, 10/14  Antibiotics   Vancomycin, 10/13  Zosyn, 10/13  Interval History/Subjective  Very sleepy Not able to urinate Reports pain is somewhat stable  Objective   Vitals:  Vitals:   08/14/19 1308 08/14/19 1544  BP: 118/66 126/70  Pulse: 79 78  Resp: 16 18  Temp: 98.1 F (36.7 C) 99.1 F (37.3 C)  SpO2: 96% 99%    Exam:  Somnolent but easily arousable, alert to place, self, time, context, no focal deficits Tremulous on exam Regular rate and rhythm, no appreciable murmurs rubs or gallops, no edema Dressing in place on left great toe, noted warmth and redness extending from left foot to mid ankle    I have personally reviewed the following:   Data Reviewed: Basic Metabolic Panel: Recent Labs  Lab 08/13/19 1905 08/14/19 0532  NA 130* 133*  K 3.5 3.2*  CL 96* 99  CO2 23 25  GLUCOSE 136* 134*  BUN 15 18  CREATININE 0.95 0.99  CALCIUM 8.8* 8.4*  MG  --  1.8   Liver Function Tests: Recent Labs  Lab 08/13/19 1905  AST 16  ALT 13  ALKPHOS 73  BILITOT 0.9  PROT 6.9  ALBUMIN 3.2*   No results for input(s): LIPASE, AMYLASE in the last 168 hours. No results for input(s): AMMONIA  in the last 168 hours. CBC: Recent Labs  Lab 08/13/19 1905 08/14/19 0532  WBC 7.4 5.6  HGB 11.3* 10.6*  HCT 33.1* 32.5*  MCV 93.0 97.0  PLT 137* 119*   Cardiac Enzymes: No results for input(s): CKTOTAL, CKMB, CKMBINDEX, TROPONINI in the last 168 hours. BNP (last 3 results) Recent Labs    07/15/19 0424 07/16/19 0224 07/17/19 0315  BNP 130.1* 77.2 131.2*    ProBNP (last 3 results) No results for input(s): PROBNP in the last 8760 hours.  CBG: Recent Labs  Lab 08/13/19 2242 08/14/19 0753 08/14/19 1210 08/14/19 1540  GLUCAP 148* 153* 137* 190*    Recent Results (from the past 240 hour(s))  SARS CORONAVIRUS 2 (TAT 6-24 HRS) Nasopharyngeal Nasopharyngeal Swab     Status: None   Collection Time: 08/13/19  8:20 PM   Specimen: Nasopharyngeal Swab  Result Value Ref Range Status   SARS Coronavirus 2 NEGATIVE NEGATIVE Final    Comment: (NOTE) SARS-CoV-2 target nucleic acids are NOT DETECTED. The SARS-CoV-2 RNA is generally detectable in upper and lower respiratory specimens during the acute phase of infection. Negative results do not preclude SARS-CoV-2 infection, do not rule out co-infections with other pathogens, and should not be used as the sole basis for treatment or other patient management decisions. Negative results must be combined with clinical observations, patient history, and epidemiological information. The expected result is Negative. Fact Sheet for Patients: SugarRoll.be Fact Sheet for Healthcare Providers: https://www.woods-mathews.com/ This test is not yet approved or cleared by the [place] FDA and  has been authorized for detection and/or diagnosis of SARS-CoV-2 by FDA under an Emergency Use Authorization (EUA). This EUA will remain  in effect (meaning this test can be used) for the duration of the COVID-19 declaration under Section 56 4(b)(1) of the Act, 21 U.S.C. section 360bbb-3(b)(1), unless the  authorization is terminated or revoked sooner. Performed at [facility], [place] [address], [place], [place] [address]   MRSA PCR Screening     Status: None   Collection Time: 08/14/19  1:45 PM   Specimen: Nasal Mucosa; Nasopharyngeal  Result Value Ref Range Status   MRSA by PCR NEGATIVE NEGATIVE Final    Comment:        The GeneXpert MRSA Assay (FDA approved for NASAL specimens only), is one component of a comprehensive MRSA colonization surveillance program. It is not intended to diagnose MRSA infection nor to guide or monitor treatment for MRSA infections. Performed at [facility], [place] [address] [place], [place] [address]      Studies: Mr Foot Left W Wo Contrast  Result Date: 08/14/2019 CLINICAL DATA:  Foot swelling and drainage from surgical wound at the left great toe. EXAM: MRI OF THE LEFT FOREFOOT WITHOUT AND WITH CONTRAST TECHNIQUE: Multiplanar, multisequence MR imaging of the left forefoot was performed both before and after administration of intravenous contrast. CONTRAST:  41mL GADAVIST GADOBUTROL 1 MMOL/ML IV SOLN COMPARISON:  Radiographs dated 08/13/2019 and MRI dated 07/15/2019 FINDINGS:  Bones/Joint/Cartilage The metallic artifact created by the K-wires obscures almost all detail of the phalangeal bones of the great toe. There is edema and enhancement of the base of the proximal phalanx with slight decreased T1-1 weighted signal, which are consistent with osteomyelitis. There is no discrete bone destruction. There is a small effusion at the first MTP joint. Muscles and Tendons There is a small amount of fluid in the sheath of the flexor hallucis longus tendon at the level of the first metatarsal best seen on series 9. Soft tissues There is an 11 x 6 x 4 mm abscess at the plantar aspect of the mid proximal phalanx of the great toe best seen on image 12 of series 14 and image 23 of series 7. There is nonspecific edema in the subcutaneous fat of the  dorsum of the foot. IMPRESSION: 1. Findings consistent with osteomyelitis of the base of the proximal phalanx of the great toe. 2. Small abscess at the plantar aspect of the midshaft of the proximal phalanx of the great toe. 3. Fluid in the tendon sheath of the flexor hallucis longus tendon at the level of the first metatarsal. This is consistent with tenosynovitis which could be inflammatory or infectious. Electronically Signed   By: Lorriane Shire M.D.   On: 08/14/2019 07:57    Scheduled Meds:  [MAR Hold] amitriptyline  100 mg Oral QHS   [MAR Hold] atorvastatin  40 mg Oral QPM   [MAR Hold] DULoxetine  60 mg Oral Daily   [MAR Hold] furosemide  20 mg Oral Daily   [MAR Hold] gabapentin  1,200 mg Oral QHS   [MAR Hold] gabapentin  900 mg Oral BID WC   [MAR Hold] insulin aspart  0-15 Units Subcutaneous TID WC   [MAR Hold] lisinopril  20 mg Oral Daily   [MAR Hold] pantoprazole  40 mg Oral Daily   [MAR Hold] primidone  50 mg Oral BID   Continuous Infusions:  sodium chloride 100 mL/hr at 08/14/19 1004   [MAR Hold] piperacillin-tazobactam (ZOSYN)  IV 3.375 g (08/14/19 0838)   [MAR Hold] vancomycin 1,500 mg (08/14/19 0837)    Active Problems:   Cellulitis      Bacilio Abascal D Remus Hagedorn  [facility]

## 2019-08-14 NOTE — Anesthesia Preprocedure Evaluation (Signed)
Anesthesia Evaluation  Patient identified by MRN, date of birth, ID band Patient awake    Reviewed: Allergy & Precautions, H&P , NPO status , Patient's Chart, lab work & pertinent test results  Airway Mallampati: II   Neck ROM: full    Dental   Pulmonary asthma , former smoker,    breath sounds clear to auscultation       Cardiovascular hypertension, + Peripheral Vascular Disease   Rhythm:regular Rate:Normal     Neuro/Psych PSYCHIATRIC DISORDERS Anxiety Depression  Neuromuscular disease    GI/Hepatic PUD, GERD  ,  Endo/Other  diabetes, Type 2obese  Renal/GU      Musculoskeletal  (+) Arthritis ,   Abdominal   Peds  Hematology   Anesthesia Other Findings   Reproductive/Obstetrics                             Anesthesia Physical Anesthesia Plan  ASA: III  Anesthesia Plan: MAC   Post-op Pain Management:    Induction: Intravenous  PONV Risk Score and Plan: 1 and Ondansetron, Propofol infusion and Treatment may vary due to age or medical condition  Airway Management Planned: Simple Face Mask  Additional Equipment:   Intra-op Plan:   Post-operative Plan:   Informed Consent: I have reviewed the patients History and Physical, chart, labs and discussed the procedure including the risks, benefits and alternatives for the proposed anesthesia with the patient or authorized representative who has indicated his/her understanding and acceptance.       Plan Discussed with: CRNA, Anesthesiologist and Surgeon  Anesthesia Plan Comments:         Anesthesia Quick Evaluation

## 2019-08-14 NOTE — Progress Notes (Addendum)
Subjective:  Patient ID: Eugene Diaz, male    DOB: 09-08-1966,  MRN: [id number]  Patient seen in pre-up. Upset over having to lose his toe. Denies pain. Had MRI performed.  Had breakfast this morning so his surgery had to be delayed. Objective:   Vitals:   08/14/19 1308 08/14/19 1544  BP: 118/66 126/70  Pulse: 79 78  Resp: 16 18  Temp: 98.1 F (36.7 C) 99.1 F (37.3 C)  SpO2: 96% 99%   General AA&O x3. Normal mood and affect.  Vascular Dorsalis pedis and posterior tibial pulses 2/4 bilat. Brisk capillary refill to all digits. Pedal hair present.  Neurologic Epicritic sensation grossly intact.  Dermatologic Dressing intact LLE. Proximal left leg cellulitis.  Orthopedic: Motor intact LLE   Results for orders placed or performed during the hospital encounter of 08/13/19 (from the past 24 hour(s))  Iron and TIBC     Status: Abnormal   Collection Time: 08/13/19  7:04 PM  Result Value Ref Range   Iron 14 (L) 45 - 182 ug/dL   TIBC 227 (L) 250 - 450 ug/dL   Saturation Ratios 6 (L) 17.9 - 39.5 %   UIBC 213 ug/dL  Ferritin     Status: Abnormal   Collection Time: 08/13/19  7:04 PM  Result Value Ref Range   Ferritin 375 (H) 24 - 336 ng/mL  Vitamin B12     Status: Abnormal   Collection Time: 08/13/19  7:04 PM  Result Value Ref Range   Vitamin B-12 52 (L) 180 - 914 pg/mL  CBC     Status: Abnormal   Collection Time: 08/13/19  7:05 PM  Result Value Ref Range   WBC 7.4 4.0 - 10.5 K/uL   RBC 3.56 (L) 4.22 - 5.81 MIL/uL   Hemoglobin 11.3 (L) 13.0 - 17.0 g/dL   HCT 33.1 (L) 39.0 - 52.0 %   MCV 93.0 80.0 - 100.0 fL   MCH 31.7 26.0 - 34.0 pg   MCHC 34.1 30.0 - 36.0 g/dL   RDW 12.4 11.5 - 15.5 %   Platelets 137 (L) 150 - 400 K/uL   nRBC 0.0 0.0 - 0.2 %  Comprehensive metabolic panel     Status: Abnormal   Collection Time: 08/13/19  7:05 PM  Result Value Ref Range   Sodium 130 (L) 135 - 145 mmol/L   Potassium 3.5 3.5 - 5.1 mmol/L   Chloride 96 (L) 98 - 111 mmol/L   CO2 23 22  - 32 mmol/L   Glucose, Bld 136 (H) 70 - 99 mg/dL   BUN 15 6 - 20 mg/dL   Creatinine, Ser 0.95 0.61 - 1.24 mg/dL   Calcium 8.8 (L) 8.9 - 10.3 mg/dL   Total Protein 6.9 6.5 - 8.1 g/dL   Albumin 3.2 (L) 3.5 - 5.0 g/dL   AST 16 15 - 41 U/L   ALT 13 0 - 44 U/L   Alkaline Phosphatase 73 38 - 126 U/L   Total Bilirubin 0.9 0.3 - 1.2 mg/dL   GFR calc non Af Amer >60 >60 mL/min   GFR calc Af Amer >60 >60 mL/min   Anion gap 11 5 - 15  Lactic acid, plasma     Status: None   Collection Time: 08/13/19  7:20 PM  Result Value Ref Range   Lactic Acid, Venous 1.0 0.5 - 1.9 mmol/L  SARS CORONAVIRUS 2 (TAT 6-24 HRS) Nasopharyngeal Nasopharyngeal Swab     Status: None   Collection Time: 08/13/19  8:20  PM   Specimen: Nasopharyngeal Swab  Result Value Ref Range   SARS Coronavirus 2 NEGATIVE NEGATIVE  Glucose, capillary     Status: Abnormal   Collection Time: 08/13/19 10:42 PM  Result Value Ref Range   Glucose-Capillary 148 (H) 70 - 99 mg/dL  Basic metabolic panel     Status: Abnormal   Collection Time: 08/14/19  5:32 AM  Result Value Ref Range   Sodium 133 (L) 135 - 145 mmol/L   Potassium 3.2 (L) 3.5 - 5.1 mmol/L   Chloride 99 98 - 111 mmol/L   CO2 25 22 - 32 mmol/L   Glucose, Bld 134 (H) 70 - 99 mg/dL   BUN 18 6 - 20 mg/dL   Creatinine, Ser 0.99 0.61 - 1.24 mg/dL   Calcium 8.4 (L) 8.9 - 10.3 mg/dL   GFR calc non Af Amer >60 >60 mL/min   GFR calc Af Amer >60 >60 mL/min   Anion gap 9 5 - 15  CBC     Status: Abnormal   Collection Time: 08/14/19  5:32 AM  Result Value Ref Range   WBC 5.6 4.0 - 10.5 K/uL   RBC 3.35 (L) 4.22 - 5.81 MIL/uL   Hemoglobin 10.6 (L) 13.0 - 17.0 g/dL   HCT 32.5 (L) 39.0 - 52.0 %   MCV 97.0 80.0 - 100.0 fL   MCH 31.6 26.0 - 34.0 pg   MCHC 32.6 30.0 - 36.0 g/dL   RDW 12.9 11.5 - 15.5 %   Platelets 119 (L) 150 - 400 K/uL   nRBC 0.0 0.0 - 0.2 %  Magnesium     Status: None   Collection Time: 08/14/19  5:32 AM  Result Value Ref Range   Magnesium 1.8 1.7 - 2.4 mg/dL   Glucose, capillary     Status: Abnormal   Collection Time: 08/14/19  7:53 AM  Result Value Ref Range   Glucose-Capillary 153 (H) 70 - 99 mg/dL  Glucose, capillary     Status: Abnormal   Collection Time: 08/14/19 12:10 PM  Result Value Ref Range   Glucose-Capillary 137 (H) 70 - 99 mg/dL  Urinalysis, Routine w reflex microscopic     Status: None   Collection Time: 08/14/19 12:32 PM  Result Value Ref Range   Color, Urine YELLOW YELLOW   APPearance CLEAR CLEAR   Specific Gravity, Urine 1.020 1.005 - 1.030   pH 5.0 5.0 - 8.0   Glucose, UA NEGATIVE NEGATIVE mg/dL   Hgb urine dipstick NEGATIVE NEGATIVE   Bilirubin Urine NEGATIVE NEGATIVE   Ketones, ur NEGATIVE NEGATIVE mg/dL   Protein, ur NEGATIVE NEGATIVE mg/dL   Nitrite NEGATIVE NEGATIVE   Leukocytes,Ua NEGATIVE NEGATIVE  MRSA PCR Screening     Status: None   Collection Time: 08/14/19  1:45 PM   Specimen: Nasal Mucosa; Nasopharyngeal  Result Value Ref Range   MRSA by PCR NEGATIVE NEGATIVE  Glucose, capillary     Status: Abnormal   Collection Time: 08/14/19  3:40 PM  Result Value Ref Range   Glucose-Capillary 190 (H) 70 - 99 mg/dL   Comment 1 Notify RN     Assessment & Plan:  Patient was evaluated and treated and all questions answered.  Left Great toe Osteomyelitis, abscess -MRI reviewed c/w above -OR today for left great toe amputation -Reinforced the need for compliance post-op to prevent worsening. -Consent reviewed and signed -LLE marked. -Proceed to [place], DPM  Accessible via secure chat for questions or concerns.

## 2019-08-14 NOTE — Plan of Care (Signed)
?  Problem: Education: ?Goal: Knowledge of General Education information will improve ?Description: Including pain rating scale, medication(s)/side effects and non-pharmacologic comfort measures ?Outcome: Progressing ?  ?Problem: Health Behavior/Discharge Planning: ?Goal: Ability to manage health-related needs will improve ?Outcome: Progressing ?  ?Problem: Clinical Measurements: ?Goal: Ability to maintain clinical measurements within normal limits will improve ?Outcome: Progressing ?  ?Problem: Clinical Measurements: ?Goal: Respiratory complications will improve ?Outcome: Progressing ?  ?Problem: Clinical Measurements: ?Goal: Cardiovascular complication will be avoided ?Outcome: Progressing ?  ?

## 2019-08-14 NOTE — Op Note (Signed)
Patient Name: Eugene Diaz DOB: [birth year]  MRN: [id number]   Date of Service: 08/14/2019  Surgeon: Dr. Hardie Pulley, DPM Assistants: None Pre-operative Diagnosis:  Osteomyelitis Post-operative Diagnosis:  Same Procedures:  1) Amputation left great toe Pathology/Specimens: ID Type Source Tests Collected by Time Destination  1 : left great toe Amputation Toe, Left SURGICAL PATHOLOGY Evelina Bucy, DPM 08/14/2019 1625   A : tissue from left great toe Tissue Soft Tissue, Other AEROBIC/ANAEROBIC CULTURE (SURGICAL/DEEP WOUND) Evelina Bucy, DPM 08/14/2019 1632    Anesthesia: MAC/local Hemostasis:  Total Tourniquet Time Documented: Calf (Left) - 22 minutes Total: Calf (Left) - 22 minutes  Estimated Blood Loss: 20 mL Materials: * No implants in log * Medications: none Complications: none  Indications for Procedure:  This is a 53 y.o. male with a chronic infection to the left great toe. He had additional fracture that was pinned. His post-op course was complicated by non-compliance. He now has signs of osteomyelitis of the toe. It was discussed with the patient he would benefit from amputation of the digit. Patient agreed to proceed. All risks, benefits, and alternatives were discussed. No guarantees given.  Today's procedure was more extensive than the previous attempt to resolve the infection to his left great toe.   Procedure in Detail: Patient was identified in pre-operative holding area. Formal consent was signed and the left lower extremity was marked. Patient was brought back to the operating room. Anesthesia was induced. The pins were removed prior to prepping. Purulence was noted along the pin tracts. The extremity was prepped and draped in the usual sterile fashion. Timeout was taken to confirm patient name, laterality, and procedure prior to incision.   Attention was then directed to the left great toe where a fish mouth incision was made at the metatarsophalangeal  joint. Dissection was carried down to level of bone. There was previous fracture and the toe was removed at the level of the fracture. Dissection was continued to the MP joint and all collateral ligaments were freed at the joint.  The soft tissue attachments of the proximal phalanx were removed and the bone was passed for pathology.  The remaining metatarsal head appeared healthy and viable.  The area was copiously irrigated with pulse lavage.  A TLS drain was placed in the deep tissue. The skin was reapproximated with 3-0 monocryl, 3-0 nylon and skin staples. The foot was then dressed with xeroform, 4x4, kerlix, and ACE bandage. Patient tolerated the procedure well.   Disposition: Following a period of post-operative monitoring, patient will be transferred back to the floor. He will need continued ABx therapy.

## 2019-08-14 NOTE — Progress Notes (Signed)
Patient refusing to attempt to void with RN in room and is drowsy when up to toilet and unable to independently toilet himself. RN notified attending MD that pt had 619ml of urine in bladder post bladder scan and MD said to I&O cath pt. RN rounded with MD at bedside and observed pt dark colored urine during I&O and drowsiness. Spoke with attending about plan to go to surgery around 4pm and to send UA from I&O cath. Will continue to monitor with CIWA and PRN meds.

## 2019-08-14 NOTE — Progress Notes (Signed)
Pt did not had Urine Output during the shift, he stated at home he drinks beer and it helps him to void. Bladder Scan is done with 214 ml of urine. NP Schorr is paged. Pt is not in discomfort. Bladder not distended. Will continue to monitor.

## 2019-08-15 ENCOUNTER — Encounter (HOSPITAL_COMMUNITY): Payer: Self-pay | Admitting: Podiatry

## 2019-08-15 ENCOUNTER — Telehealth: Payer: Self-pay | Admitting: *Deleted

## 2019-08-15 DIAGNOSIS — M86172 Other acute osteomyelitis, left ankle and foot: Secondary | ICD-10-CM

## 2019-08-15 DIAGNOSIS — M869 Osteomyelitis, unspecified: Secondary | ICD-10-CM

## 2019-08-15 DIAGNOSIS — D696 Thrombocytopenia, unspecified: Secondary | ICD-10-CM

## 2019-08-15 DIAGNOSIS — E1169 Type 2 diabetes mellitus with other specified complication: Secondary | ICD-10-CM

## 2019-08-15 DIAGNOSIS — L03116 Cellulitis of left lower limb: Secondary | ICD-10-CM

## 2019-08-15 DIAGNOSIS — Z9889 Other specified postprocedural states: Secondary | ICD-10-CM

## 2019-08-15 DIAGNOSIS — E871 Hypo-osmolality and hyponatremia: Secondary | ICD-10-CM

## 2019-08-15 DIAGNOSIS — E538 Deficiency of other specified B group vitamins: Secondary | ICD-10-CM

## 2019-08-15 DIAGNOSIS — I10 Essential (primary) hypertension: Secondary | ICD-10-CM

## 2019-08-15 DIAGNOSIS — F1010 Alcohol abuse, uncomplicated: Secondary | ICD-10-CM

## 2019-08-15 DIAGNOSIS — D649 Anemia, unspecified: Secondary | ICD-10-CM

## 2019-08-15 LAB — BASIC METABOLIC PANEL
Anion gap: 8 (ref 5–15)
BUN: 13 mg/dL (ref 6–20)
CO2: 24 mmol/L (ref 22–32)
Calcium: 8.5 mg/dL — ABNORMAL LOW (ref 8.9–10.3)
Chloride: 101 mmol/L (ref 98–111)
Creatinine, Ser: 0.89 mg/dL (ref 0.61–1.24)
GFR calc Af Amer: >60 mL/min (ref >60)
GFR calc non Af Amer: >60 mL/min (ref >60)
Glucose, Bld: 124 mg/dL — ABNORMAL HIGH (ref 70–99)
Potassium: 3.8 mmol/L (ref 3.5–5.1)
Sodium: 133 mmol/L — ABNORMAL LOW (ref 135–145)

## 2019-08-15 LAB — GLUCOSE, CAPILLARY
Glucose-Capillary: 144 mg/dL — ABNORMAL HIGH (ref 70–99)
Glucose-Capillary: 102 mg/dL — ABNORMAL HIGH (ref 70–99)
Glucose-Capillary: 165 mg/dL — ABNORMAL HIGH (ref 70–99)
Glucose-Capillary: 183 mg/dL — ABNORMAL HIGH (ref 70–99)
Glucose-Capillary: 133 mg/dL — ABNORMAL HIGH (ref 70–99)

## 2019-08-15 LAB — CBC
HCT: 30.3 % — ABNORMAL LOW (ref 39.0–52.0)
Hemoglobin: 9.9 g/dL — ABNORMAL LOW (ref 13.0–17.0)
MCH: 31.6 pg (ref 26.0–34.0)
MCHC: 32.7 g/dL (ref 30.0–36.0)
MCV: 96.8 fL (ref 80.0–100.0)
Platelets: 126 10*3/uL — ABNORMAL LOW (ref 150–400)
RBC: 3.13 MIL/uL — ABNORMAL LOW (ref 4.22–5.81)
RDW: 12.7 % (ref 11.5–15.5)
WBC: 5.5 10*3/uL (ref 4.0–10.5)
nRBC: 0 % (ref 0.0–0.2)

## 2019-08-15 LAB — FOLATE RBC
Folate, Hemolysate: 360 ng/mL
Folate, RBC: 1075 ng/mL (ref >498)
Hematocrit: 33.5 % — ABNORMAL LOW (ref 37.5–51.0)

## 2019-08-15 NOTE — Telephone Encounter (Signed)
[facility] asked for wound care orders and weight bearing status. [name] states current dressing is to remain in place and pt can weight bear as tolerated.

## 2019-08-15 NOTE — Telephone Encounter (Signed)
I informed Eugene Diaz of Dr. Eleanora Neighbor orders.

## 2019-08-15 NOTE — Progress Notes (Signed)
[facility]  PROGRESS NOTE  Eugene Diaz [id number] DOB: 04-03-1966 DOA: 08/13/2019 PCP: [name], PA-C  Brief History    Eugene Diaz is a 53 y.o. year old male with medical history significant for ype 2 diabetes with peripheral neuropathy, hypertension, hyperlipidemia who presented on 08/13/2019 as a direct admission [facility] due to concern for left foot cellulitis and possible osteomyelitis based off x-ray of foot in office.  Patient is status post open reduction/pinning incision and drainage of a recent closed displaced fracture of the left great toe (07/16/2019) seen in podiatry office on 10/9.  On arrival to [facility] patient with fever of 103, tachycardic to 115 And empirically started on IV Zosyn and vancomycin while awaiting MRI to rule out osteomyelitis.  A & P    Sepsis secondary to left great toe osteomyelitis.  Presented with tachycardia, T-max 103 and high concern for osteomyelitis based on outpatient imaging.  MRI confirms OM.  Sepsis physiology now resolved.  Patient s/p potation of left great toe (10/14) by podiatry.  continue IV vancomycin and Zosyn.  Monitor blood and intraoperative cultures, fever curve, white count.   Alcohol abuse.  Admits to drinking approximately 6 to 712 ounce beers daily, last drink on day of admission.  No hypertension, no hallucinations, closely monitor for CIWA protocol.   Normocytic anemia.  Tends to fluctuate between 11 and 13 in the past month.  Denies any active bleeding.  B12 low, iron panel consistent with anemia of chronic disease.  Replete B12 once returns from surgery.   Chronic mild hyponatremia, improving.  Currently 133 oh ( up from 130 on admission).  Remains asymptomatic, closely monitor.    Chronic thrombocytopenia.  Currently stable at baseline.  No active signs of bleeding.  Suspect related to alcohol use, repeat CBC in a.m.   Type 2 diabetes.  A1c 6.6 (07/2019)Hold home  oral hypoglycemics, monitor CBG, sliding scale as needed   Hyperlipidemia, stable.  Continue Lipitor   Peripheral neuropathy, continue Cymbalta, continue gabapentin   Depression continue Elavil   Hypertension, stable.  Continue lisinopril   GERD, stable continue PPI     DVT prophylaxis: SCDs Code Status: Full Family Communication: No family at bedside Disposition Plan: continue IV vancomycin and Zosyn, monitor blood and intraoperative cultures.     [facility] Direct contact: see [email or web address] (further directions at bottom of note if needed) 7PM-7AM contact night coverage as at bottom of note 08/15/2019, 6:01 PM  LOS: 2 days   Consultants  . Podiatry  Procedures  . Left great toe amputation with drain placed, 10/14  Antibiotics  . Vancomycin, 10/13 . Zosyn, 10/13  Interval History/Subjective  Foot pain well controlled Tearful on exam, wife passed away 2 months ago  Objective   Vitals:  Vitals:   08/15/19 1015 08/15/19 1440  BP: 109/82 131/81  Pulse: 85 84  Resp: 17 17  Temp: 100.1 F (37.8 C) 99.2 F (37.3 C)  SpO2: 100% 99%    Exam: Alert, oriented, no distress No tremors Left foot with dressing in place and drain with serosanguineous output     I have personally reviewed the following:   Data Reviewed: Basic Metabolic Panel: Recent Labs  Lab 08/13/19 1905 08/14/19 0532 08/15/19 0220  NA 130* 133* 133*  K 3.5 3.2* 3.8  CL 96* 99 101  CO2 23 25 24   GLUCOSE 136* 134* 124*  BUN 15 18 13   CREATININE 0.95 0.99 0.89  CALCIUM 8.8* 8.4* 8.5*  MG  --  1.8  --    Liver Function Tests: Recent Labs  Lab 08/13/19 1905  AST 16  ALT 13  ALKPHOS 73  BILITOT 0.9  PROT 6.9  ALBUMIN 3.2*   No results for input(s): LIPASE, AMYLASE in the last 168 hours. No results for input(s): AMMONIA in the last 168 hours. CBC: Recent Labs  Lab 08/13/19 1905 08/13/19 2046 08/14/19 0532 08/15/19 0220  WBC 7.4  --  5.6 5.5  HGB 11.3*  --   10.6* 9.9*  HCT 33.1* 33.5* 32.5* 30.3*  MCV 93.0  --  97.0 96.8  PLT 137*  --  119* 126*   Cardiac Enzymes: No results for input(s): CKTOTAL, CKMB, CKMBINDEX, TROPONINI in the last 168 hours. BNP (last 3 results) Recent Labs    07/15/19 0424 07/16/19 0224 07/17/19 0315  BNP 130.1* 77.2 131.2*    ProBNP (last 3 results) No results for input(s): PROBNP in the last 8760 hours.  CBG: Recent Labs  Lab 08/14/19 2024 08/15/19 0059 08/15/19 0800 08/15/19 1159 08/15/19 1649  GLUCAP 118* 144* 102* 165* 183*    Recent Results (from the past 240 hour(s))  Culture, blood (routine x 2)     Status: None (Preliminary result)   Collection Time: 08/13/19  7:21 PM   Specimen: BLOOD  Result Value Ref Range Status   Specimen Description   Final    BLOOD RIGHT ANTECUBITAL Performed at [facility], [place] [address] [place], [place] [address]    Special Requests   Final    BOTTLES DRAWN AEROBIC AND ANAEROBIC Blood Culture adequate volume Performed at [facility] [address], [place], [place] [address]    Culture   Final    NO GROWTH 2 DAYS Performed at [facility] [address] [place], [place] [address]    Report Status PENDING  Incomplete  Culture, blood (routine x 2)     Status: None (Preliminary result)   Collection Time: 08/13/19  7:24 PM   Specimen: BLOOD  Result Value Ref Range Status   Specimen Description   Final    BLOOD LEFT ANTECUBITAL Performed at [facility] [address], [place], [place] [address]    Special Requests   Final    BOTTLES DRAWN AEROBIC AND ANAEROBIC Blood Culture adequate volume Performed at [facility] [facility] [address] [place], [place] [address]    Culture   Final    NO GROWTH 2 DAYS Performed at [facility] [address], [place], [place] [address]    Report Status PENDING  Incomplete  SARS CORONAVIRUS 2 (TAT 6-24 HRS) Nasopharyngeal  Nasopharyngeal Swab     Status: None   Collection Time: 08/13/19  8:20 PM   Specimen: Nasopharyngeal Swab  Result Value Ref Range Status   SARS Coronavirus 2 NEGATIVE NEGATIVE Final    Comment: (NOTE) SARS-CoV-2 target nucleic acids are NOT DETECTED. The SARS-CoV-2 RNA is generally detectable in upper and lower respiratory specimens during the acute phase of infection. Negative results do not preclude SARS-CoV-2 infection, do not rule out co-infections with other pathogens, and should not be used as the sole basis for treatment or other patient management decisions. Negative results must be combined with clinical observations, patient history, and epidemiological information. The expected result is Negative. Fact Sheet for Patients: SugarRoll.be Fact Sheet for Healthcare Providers: https://www.woods-mathews.com/ This test is not yet approved or cleared by the  [place] Architectural technologist and  has been authorized for detection and/or diagnosis of SARS-CoV-2 by FDA under an Print production planner (EUA). This EUA will remain  in effect (meaning this test can be used) for the duration of the COVID-19 declaration under Section 56 4(b)(1) of the Act, 21 U.S.C. section 360bbb-3(b)(1), unless the authorization is terminated or revoked sooner. Performed at [facility], [place] [address] [place], [place] [address]   MRSA PCR Screening     Status: None   Collection Time: 08/14/19  1:45 PM   Specimen: Nasal Mucosa; Nasopharyngeal  Result Value Ref Range Status   MRSA by PCR NEGATIVE NEGATIVE Final    Comment:        The GeneXpert MRSA Assay (FDA approved for NASAL specimens only), is one component of a comprehensive MRSA colonization surveillance program. It is not intended to diagnose MRSA infection nor to guide or monitor treatment for MRSA infections. Performed at [facility], [place] [address], [place], [place] [address]    Aerobic/Anaerobic Culture (surgical/deep wound)     Status: None (Preliminary result)   Collection Time: 08/14/19  4:32 PM   Specimen: Soft Tissue, Other  Result Value Ref Range Status   Specimen Description   Final    TISSUE LEFT GREAT TOE Performed at [facility] [address] [place], [place] [address]    Special Requests   Final    NONE Performed at [facility], [place] [address], [place], [place] [address]    Gram Stain   Final    RARE WBC PRESENT, PREDOMINANTLY MONONUCLEAR RARE GRAM POSITIVE COCCI    Culture   Final    TOO YOUNG TO READ Performed at [facility], [place] [address], [place], [place] [address]    Report Status PENDING  Incomplete     Studies: Mr Foot Left W Wo Contrast  Result Date: 08/14/2019 CLINICAL DATA:  Foot swelling and drainage from surgical wound at the left great toe. EXAM: MRI OF THE LEFT FOREFOOT WITHOUT AND WITH CONTRAST TECHNIQUE: Multiplanar, multisequence MR imaging of the left forefoot was performed both before and after administration of intravenous contrast. CONTRAST:  58mL GADAVIST GADOBUTROL 1 MMOL/ML IV SOLN COMPARISON:  Radiographs dated 08/13/2019 and MRI dated 07/15/2019 FINDINGS: Bones/Joint/Cartilage The metallic artifact created by the K-wires obscures almost all detail of the phalangeal bones of the great toe. There is edema and enhancement of the base of the proximal phalanx with slight decreased T1-1 weighted signal, which are consistent with osteomyelitis. There is no discrete bone destruction. There is a small effusion at the first MTP joint. Muscles and Tendons There is a small amount of fluid in the sheath of the flexor hallucis longus tendon at the level of the first metatarsal best seen on series 9. Soft tissues There is an 11 x 6 x 4 mm abscess at the plantar aspect of the mid proximal phalanx of the great toe best seen on image 12 of series 14 and image 23 of series 7. There is  nonspecific edema in the subcutaneous fat of the dorsum of the foot. IMPRESSION: 1. Findings consistent with osteomyelitis of the base of the proximal phalanx of the great toe. 2. Small abscess at the plantar aspect of the midshaft of the proximal phalanx of the great toe. 3. Fluid in the tendon sheath of the flexor hallucis longus tendon at the level of the first metatarsal. This is consistent with tenosynovitis which could be  inflammatory or infectious. Electronically Signed   By: Lorriane Shire M.D.   On: 08/14/2019 07:57   Dg Foot 2 Views Left  Result Date: 08/14/2019 CLINICAL DATA:  53 year old male status post amputation of the great toe of the left foot. EXAM: LEFT FOOT - 2 VIEW COMPARISON:  Left foot radiograph dated 08/13/2019 FINDINGS: Status post amputation of the left great toe. There is no acute fracture or dislocation. The bones are well mineralized. No significant arthritic changes. No periosteal elevation or bone erosion. Drainage tube and cutaneous staples noted over the surgical site. IMPRESSION: Postsurgical changes of great toe amputation. Electronically Signed   By: Anner Crete M.D.   On: 08/14/2019 19:40    Scheduled Meds: . amitriptyline  100 mg Oral QHS  . atorvastatin  40 mg Oral QPM  . DULoxetine  60 mg Oral Daily  . furosemide  20 mg Oral Daily  . gabapentin  1,200 mg Oral QHS  . gabapentin  900 mg Oral BID WC  . insulin aspart  0-15 Units Subcutaneous TID WC  . lisinopril  20 mg Oral Daily  . pantoprazole  40 mg Oral Daily  . primidone  50 mg Oral BID  . vitamin B-12  1,000 mcg Oral Daily   Continuous Infusions: . piperacillin-tazobactam (ZOSYN)  IV 3.375 g (08/15/19 1656)  . vancomycin 1,500 mg (08/15/19 0934)    Active Problems:   Hypertension   Thrombocytopenia (HCC)   ETOH abuse   GERD (gastroesophageal reflux disease)   Cellulitis   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)   Acute osteomyelitis of toe of left foot (HCC)   Sepsis (HCC)    Hyponatremia   Normocytic anemia   B12 deficiency   Acute osteomyelitis of left ankle or foot ([facility])      Desiree Hane  [facility]

## 2019-08-15 NOTE — Progress Notes (Signed)
Subjective:  Patient ID: Eugene Diaz, male    DOB: April 29, 1966,  MRN: [id number]  Patient seen bedside. Denies pain. Denies post-op concerns. Denies N/V/F/Ch. Objective:   Vitals:   08/15/19 2039 08/15/19 2244  BP:  111/74  Pulse:  68  Resp:  16  Temp: 98.7 F (37.1 C) 98.2 F (36.8 C)  SpO2:  92%   General AA&O x3. Normal mood and affect.  Vascular Dorsalis pedis and posterior tibial pulses 2/4 bilat. Brisk capillary refill to all digits. Pedal hair present.  Neurologic Epicritic sensation grossly intact.  Dermatologic Dressing intact LLE. Minimal drainage in TLS vacutainer.  Orthopedic: Motor intact LLE. Calf supple.   Results for orders placed or performed during the hospital encounter of 08/13/19 (from the past 24 hour(s))  Glucose, capillary     Status: Abnormal   Collection Time: 08/15/19 12:59 AM  Result Value Ref Range   Glucose-Capillary 144 (H) 70 - 99 mg/dL  CBC     Status: Abnormal   Collection Time: 08/15/19  2:20 AM  Result Value Ref Range   WBC 5.5 4.0 - 10.5 K/uL   RBC 3.13 (L) 4.22 - 5.81 MIL/uL   Hemoglobin 9.9 (L) 13.0 - 17.0 g/dL   HCT 30.3 (L) 39.0 - 52.0 %   MCV 96.8 80.0 - 100.0 fL   MCH 31.6 26.0 - 34.0 pg   MCHC 32.7 30.0 - 36.0 g/dL   RDW 12.7 11.5 - 15.5 %   Platelets 126 (L) 150 - 400 K/uL   nRBC 0.0 0.0 - 0.2 %  Basic metabolic panel     Status: Abnormal   Collection Time: 08/15/19  2:20 AM  Result Value Ref Range   Sodium 133 (L) 135 - 145 mmol/L   Potassium 3.8 3.5 - 5.1 mmol/L   Chloride 101 98 - 111 mmol/L   CO2 24 22 - 32 mmol/L   Glucose, Bld 124 (H) 70 - 99 mg/dL   BUN 13 6 - 20 mg/dL   Creatinine, Ser 0.89 0.61 - 1.24 mg/dL   Calcium 8.5 (L) 8.9 - 10.3 mg/dL   GFR calc non Af Amer >60 >60 mL/min   GFR calc Af Amer >60 >60 mL/min   Anion gap 8 5 - 15  Glucose, capillary     Status: Abnormal   Collection Time: 08/15/19  8:00 AM  Result Value Ref Range   Glucose-Capillary 102 (H) 70 - 99 mg/dL  Glucose, capillary      Status: Abnormal   Collection Time: 08/15/19 11:59 AM  Result Value Ref Range   Glucose-Capillary 165 (H) 70 - 99 mg/dL  Glucose, capillary     Status: Abnormal   Collection Time: 08/15/19  4:49 PM  Result Value Ref Range   Glucose-Capillary 183 (H) 70 - 99 mg/dL  Glucose, capillary     Status: Abnormal   Collection Time: 08/15/19 10:46 PM  Result Value Ref Range   Glucose-Capillary 133 (H) 70 - 99 mg/dL    Assessment & Plan:  Patient was evaluated and treated and all questions answered.  POD#1 s/p L Great toe amputation for Left Great toe Osteomyelitis, abscess -Micro reviewed. Too young to read -Path pending -Dressing intact -Vacutainer changed for TLS drain. -Will change dressing and remove drain tomorrow if there is minimal drainage in vacutainer. -Plan for D/c tomorrow. -Plan for 1 week of PO abx at d/c dependent upon culture results. If no cultures available will have to treat empirically.. -Will continue to follow.  Legrand Como  Kandy Garrison, DPM  Accessible via secure chat for questions or concerns.

## 2019-08-15 NOTE — Plan of Care (Signed)

## 2019-08-16 DIAGNOSIS — K219 Gastro-esophageal reflux disease without esophagitis: Secondary | ICD-10-CM

## 2019-08-16 LAB — CBC
HCT: 31.9 % — ABNORMAL LOW (ref 39.0–52.0)
Hemoglobin: 10.4 g/dL — ABNORMAL LOW (ref 13.0–17.0)
MCH: 31.8 pg (ref 26.0–34.0)
MCHC: 32.6 g/dL (ref 30.0–36.0)
MCV: 97.6 fL (ref 80.0–100.0)
Platelets: 141 10*3/uL — ABNORMAL LOW (ref 150–400)
RBC: 3.27 MIL/uL — ABNORMAL LOW (ref 4.22–5.81)
RDW: 12.8 % (ref 11.5–15.5)
WBC: 5.9 10*3/uL (ref 4.0–10.5)
nRBC: 0 % (ref 0.0–0.2)

## 2019-08-16 LAB — CREATININE, SERUM
Creatinine, Ser: 0.97 mg/dL (ref 0.61–1.24)
GFR calc Af Amer: >60 mL/min (ref >60)
GFR calc non Af Amer: >60 mL/min (ref >60)

## 2019-08-16 LAB — GLUCOSE, CAPILLARY
Glucose-Capillary: 99 mg/dL (ref 70–99)
Glucose-Capillary: 151 mg/dL — ABNORMAL HIGH (ref 70–99)
Glucose-Capillary: 105 mg/dL — ABNORMAL HIGH (ref 70–99)
Glucose-Capillary: 170 mg/dL — ABNORMAL HIGH (ref 70–99)

## 2019-08-16 LAB — SURGICAL PATHOLOGY

## 2019-08-16 LAB — VANCOMYCIN, PEAK: Vancomycin Pk: 30 ug/mL (ref 30–40)

## 2019-08-16 NOTE — Progress Notes (Signed)
Pharmacy Antibiotic Note  Eugene Diaz is a 53 y.o. male admitted on 08/13/2019 with LLE cellulitis  & suspected osteomyelitis per foot Xray.  PMH significant for DM and L great toe fracture on 07/16/19 s/p open reduction & pinning.   Pharmacy has been consulted for Vancomycin & Zosyn dosing.  08/16/2019: MRI L foot (read 10/14) -osteo great toe, plantar abscess, tenosynovitis 10/14: L great toe amputation - Tissue cx with rare GPC - Staph aureus   Plan: Zosyn 3.375g IV q8h (4 hour infusion).  Continue Vancomycin 1500mg  IV q12h to target AUC 400-550 Daily Scr wnl Vancomycin peak and trough after 8p dose tonight/before 8a dose tomorrow  Height: 6\' 2"  (188 cm) Weight: 287 lb 0.6 oz (130.2 kg) IBW/kg (Calculated) : 82.2  Temp (24hrs), Avg:99.2 F (37.3 C), Min:97.9 F (36.6 C), Max:101.8 F (38.8 C)  Recent Labs  Lab 08/13/19 1905 08/13/19 1920 08/14/19 0532 08/15/19 0220 08/16/19 0255  WBC 7.4  --  5.6 5.5 5.9  CREATININE 0.95  --  0.99 0.89 0.97  LATICACIDVEN  --  1.0  --   --   --     Estimated Creatinine Clearance: 124.9 mL/min (by C-G formula based on SCr of 0.97 mg/dL).    No Known Allergies  Antimicrobials this admission: 10/13 Vanc >>  10/14 Zosyn >>   Dose adjustments this admission:  Microbiology results: 10/13 BCx: ngtd 10/13 COVID: neg 10/14 WoundCx: few Staph aureus  Thank you for allowing pharmacy to be a part of this patient's care.  Minda Ditto, PharmD10/16/2020 12:03 PM

## 2019-08-16 NOTE — Progress Notes (Signed)
[facility]  PROGRESS NOTE  Eugene Diaz [id number] DOB: 1966/01/23 DOA: 08/13/2019 PCP: [name], PA-C  Brief History    Eugene Diaz is a 53 y.o. year old male with medical history significant for ype 2 diabetes with peripheral neuropathy, hypertension, hyperlipidemia who presented on 08/13/2019 as a direct admission from [facility] due to concern for left foot cellulitis and possible osteomyelitis based off x-ray of foot in office.  Patient is status post open reduction/pinning incision and drainage of a recent closed displaced fracture of the left great toe (07/16/2019) seen in podiatry office on 10/9.  On arrival to [facility] long patient with fever of 103, tachycardic to 115 And empirically started on IV Zosyn and vancomycin while awaiting MRI to rule out osteomyelitis.  MRI confirmed OM Status post left great toe amputation on 10/14 A & P    Sepsis secondary to left great toe osteomyelitis status post left great toe amputation.  Sepsis physiology resolved, incision site still with a bit of redness and persistent output from drain.  We will continue empiric antibiotics and continue to monitor intraoperative cultures, discussed with podiatry.   Alcohol abuse.  Admits to drinking approximately 6 to 712 ounce beers daily, last drink on day of admission.  No hypertension, no hallucinations, closely monitor for CIWA protocol.   Normocytic anemia.  Tends to fluctuate between 11 and 13 in the past month.  Denies any active bleeding.  B12 low, iron panel consistent with anemia of chronic disease.  Replete B12.   Chronic mild hyponatremia, stable.  Remains asymptomatic, closely monitor.    Chronic thrombocytopenia, stable.  Currently stable at baseline.  No active signs of bleeding.  Suspect related to alcohol use, repeat CBC in a.m.   Type 2 diabetes, well controlled.  A1c 6.6 (07/2019)Hold home oral hypoglycemics, monitor CBG, sliding scale as  needed   Hyperlipidemia, stable.  Continue Lipitor   Peripheral neuropathy, continue Cymbalta, continue gabapentin   Depression continue Elavil   Hypertension, stable.  Continue lisinopril   GERD, stable continue PPI     DVT prophylaxis: SCDs Code Status: Full Family Communication: No family at bedside Disposition Plan: continue IV vancomycin and Zosyn, monitor blood and intraoperative cultures.     [facility] Direct contact: see [email or web address] (further directions at bottom of note if needed) 7PM-7AM contact night coverage as at bottom of note 08/16/2019, 3:26 PM  LOS: 3 days   Consultants   Podiatry  Procedures   Left great toe amputation with drain placed, 10/14  Antibiotics   Vancomycin, 10/13  Zosyn, 10/13  Interval History/Subjective  Foot pain well controlled Hopeful to go home today  Objective   Vitals:  Vitals:   08/16/19 0619 08/16/19 1410  BP: 120/77 125/71  Pulse: 74 73  Resp: 17 14  Temp: 97.9 F (36.6 C) 98.5 F (36.9 C)  SpO2: 100% 100%    Exam: Alert, oriented, no distress No tremors Clear breath sounds throughout, on room air, normal respiratory effort Regular rate and rhythm, no rub rubs, no gallops, no edema Left foot with dressing in place and drain with serosanguineous output,     I have personally reviewed the following:   Data Reviewed: Basic Metabolic Panel: Recent Labs  Lab 08/13/19 1905 08/14/19 0532 08/15/19 0220 08/16/19 0255  NA 130* 133* 133*  --   K 3.5 3.2* 3.8  --   CL 96* 99 101  --   CO2 23 25 24   --  GLUCOSE 136* 134* 124*  --   BUN 15 18 13   --   CREATININE 0.95 0.99 0.89 0.97  CALCIUM 8.8* 8.4* 8.5*  --   MG  --  1.8  --   --    Liver Function Tests: Recent Labs  Lab 08/13/19 1905  AST 16  ALT 13  ALKPHOS 73  BILITOT 0.9  PROT 6.9  ALBUMIN 3.2*   No results for input(s): LIPASE, AMYLASE in the last 168 hours. No results for input(s): AMMONIA in the last 168  hours. CBC: Recent Labs  Lab 08/13/19 1905 08/13/19 2046 08/14/19 0532 08/15/19 0220 08/16/19 0255  WBC 7.4  --  5.6 5.5 5.9  HGB 11.3*  --  10.6* 9.9* 10.4*  HCT 33.1* 33.5* 32.5* 30.3* 31.9*  MCV 93.0  --  97.0 96.8 97.6  PLT 137*  --  119* 126* 141*   Cardiac Enzymes: No results for input(s): CKTOTAL, CKMB, CKMBINDEX, TROPONINI in the last 168 hours. BNP (last 3 results) Recent Labs    07/15/19 0424 07/16/19 0224 07/17/19 0315  BNP 130.1* 77.2 131.2*    ProBNP (last 3 results) No results for input(s): PROBNP in the last 8760 hours.  CBG: Recent Labs  Lab 08/15/19 1159 08/15/19 1649 08/15/19 2246 08/16/19 0743 08/16/19 1203  GLUCAP 165* 183* 133* 99 151*    Recent Results (from the past 240 hour(s))  Culture, blood (routine x 2)     Status: None (Preliminary result)   Collection Time: 08/13/19  7:21 PM   Specimen: BLOOD  Result Value Ref Range Status   Specimen Description   Final    BLOOD RIGHT ANTECUBITAL Performed at [facility] [address], [place], [place] [address]    Special Requests   Final    BOTTLES DRAWN AEROBIC AND ANAEROBIC Blood Culture adequate volume Performed at [facility] [address] [place], [place] [address]    Culture   Final    NO GROWTH 3 DAYS Performed at [facility], [place] [address] [place], [place] [address]    Report Status PENDING  Incomplete  Culture, blood (routine x 2)     Status: None (Preliminary result)   Collection Time: 08/13/19  7:24 PM   Specimen: BLOOD  Result Value Ref Range Status   Specimen Description   Final    BLOOD LEFT ANTECUBITAL Performed at [facility] [address], [place], [place] [address]    Special Requests   Final    BOTTLES DRAWN AEROBIC AND ANAEROBIC Blood Culture adequate volume Performed at [facility] [address] [place], [place] [address]    Culture   Final    NO GROWTH 3  DAYS Performed at [facility], [place] [address], [place], [place] [address]    Report Status PENDING  Incomplete  SARS CORONAVIRUS 2 (TAT 6-24 HRS) Nasopharyngeal Nasopharyngeal Swab     Status: None   Collection Time: 08/13/19  8:20 PM   Specimen: Nasopharyngeal Swab  Result Value Ref Range Status   SARS Coronavirus 2 NEGATIVE NEGATIVE Final    Comment: (NOTE) SARS-CoV-2 target nucleic acids are NOT DETECTED. The SARS-CoV-2 RNA is generally detectable in upper and lower respiratory specimens during the acute phase of infection. Negative results do not preclude SARS-CoV-2 infection, do not rule out co-infections with other pathogens, and should not be used as the sole basis for treatment or other patient management decisions. Negative results must be  combined with clinical observations, patient history, and epidemiological information. The expected result is Negative. Fact Sheet for Patients: SugarRoll.be Fact Sheet for Healthcare Providers: https://www.woods-mathews.com/ This test is not yet approved or cleared by the [place] FDA and  has been authorized for detection and/or diagnosis of SARS-CoV-2 by FDA under an Emergency Use Authorization (EUA). This EUA will remain  in effect (meaning this test can be used) for the duration of the COVID-19 declaration under Section 56 4(b)(1) of the Act, 21 U.S.C. section 360bbb-3(b)(1), unless the authorization is terminated or revoked sooner. Performed at [facility], [place] [address], [place], [place] [address]   MRSA PCR Screening     Status: None   Collection Time: 08/14/19  1:45 PM   Specimen: Nasal Mucosa; Nasopharyngeal  Result Value Ref Range Status   MRSA by PCR NEGATIVE NEGATIVE Final    Comment:        The GeneXpert MRSA Assay (FDA approved for NASAL specimens only), is one component of a comprehensive MRSA colonization surveillance program. It is not intended to  diagnose MRSA infection nor to guide or monitor treatment for MRSA infections. Performed at [facility], [place] [address], [place], [place] [address]   Aerobic/Anaerobic Culture (surgical/deep wound)     Status: None (Preliminary result)   Collection Time: 08/14/19  4:32 PM   Specimen: Soft Tissue, Other  Result Value Ref Range Status   Specimen Description   Final    TISSUE LEFT GREAT TOE Performed at [facility] [address], [place], [place] [address]    Special Requests   Final    NONE Performed at [facility], [place] [address] [place], [place] [address]    Gram Stain   Final    RARE WBC PRESENT, PREDOMINANTLY MONONUCLEAR RARE GRAM POSITIVE COCCI Performed at [facility], [place] [address] [place], [place] [address]    Culture   Final    FEW STAPHYLOCOCCUS AUREUS NO ANAEROBES ISOLATED; CULTURE IN PROGRESS FOR 5 DAYS    Report Status PENDING  Incomplete  Culture, blood (routine x 2)     Status: None (Preliminary result)   Collection Time: 08/15/19  7:17 PM   Specimen: BLOOD LEFT ARM  Result Value Ref Range Status   Specimen Description   Final    BLOOD LEFT ARM Performed at [facility], [place] [address] [place], [place] [address]    Special Requests   Final    BOTTLES DRAWN AEROBIC AND ANAEROBIC Blood Culture adequate volume Performed at [facility] [address] [place], [place] [address]    Culture   Final    NO GROWTH < 12 HOURS Performed at [facility] [address], [place], [place] [address]    Report Status PENDING  Incomplete  Culture, blood (routine x 2)     Status: None (Preliminary result)   Collection Time: 08/15/19  7:26 PM   Specimen: BLOOD LEFT ARM  Result Value Ref Range Status   Specimen Description   Final    BLOOD LEFT ARM Performed at [facility], [place] [address], [place], [place] [address]    Special Requests   Final    BOTTLES  DRAWN AEROBIC AND ANAEROBIC Blood Culture adequate volume Performed at [facility] [address], [place],  [address]    Culture   Final    NO GROWTH < 12 HOURS Performed at [facility], [place] [address], [place], [place] [address]    Report Status PENDING  Incomplete     Studies: Dg Foot 2 Views Left  Result Date: 08/14/2019 CLINICAL DATA:  53 year old male status post amputation of the great toe of the left foot. EXAM: LEFT FOOT - 2 VIEW COMPARISON:  Left foot radiograph dated 08/13/2019 FINDINGS: Status post amputation of the left great toe. There is no acute fracture or dislocation. The bones are well mineralized. No significant arthritic changes. No periosteal elevation or bone erosion. Drainage tube and cutaneous staples noted over the surgical site. IMPRESSION: Postsurgical changes of great toe amputation. Electronically Signed   By: Anner Crete M.D.   On: 08/14/2019 19:40    Scheduled Meds:  amitriptyline  100 mg Oral QHS   atorvastatin  40 mg Oral QPM   DULoxetine  60 mg Oral Daily   furosemide  20 mg Oral Daily   gabapentin  1,200 mg Oral QHS   gabapentin  900 mg Oral BID WC   insulin aspart  0-15 Units Subcutaneous TID WC   lisinopril  20 mg Oral Daily   pantoprazole  40 mg Oral Daily   primidone  50 mg Oral BID   vitamin B-12  1,000 mcg Oral Daily   Continuous Infusions:  piperacillin-tazobactam (ZOSYN)  IV 3.375 g (08/16/19 0802)   vancomycin 1,500 mg (08/16/19 0803)    Active Problems:   Hypertension   Thrombocytopenia (HCC)   ETOH abuse   GERD (gastroesophageal reflux disease)   Cellulitis   Osteomyelitis due to type 2 diabetes mellitus ([place])   Acute osteomyelitis of toe of left foot (HCC)   Sepsis (HCC)   Hyponatremia   Normocytic anemia   B12 deficiency   Acute osteomyelitis of left ankle or foot ([place])      Desiree Hane  [facility]

## 2019-08-16 NOTE — Progress Notes (Signed)
Subjective:  Patient ID: Eugene Diaz, male    DOB: 21-Aug-1966,  MRN: [id number]  Patient seen bedside. Again denies concerns. Resting comfortably. Good appetite. Denies N/V/F/Ch/CP/SOB.  Looking forward to going home. Objective:   Vitals:   08/16/19 0619 08/16/19 1410  BP: 120/77 125/71  Pulse: 74 73  Resp: 17 14  Temp: 97.9 F (36.6 C) 98.5 F (36.9 C)  SpO2: 100% 100%   General AA&O x3. Normal mood and affect.  Vascular Dorsalis pedis and posterior tibial pulses 2/4 bilat. Brisk capillary refill to all digits. Pedal hair present.  Neurologic Epicritic sensation grossly intact.  Dermatologic Dressing removed. Wound edges well coapted with intact suture and staples. Moderate periwound cellulitis, active warmth. No fluctuance, crepitus, purulence.   Vacutainer with approx 40 ccs sanguinous drainage in container  Orthopedic: Motor intact LLE. Calf supple.   Results for orders placed or performed during the hospital encounter of 08/13/19 (from the past 24 hour(s))  Culture, blood (routine x 2)     Status: None (Preliminary result)   Collection Time: 08/15/19  7:17 PM   Specimen: BLOOD LEFT ARM  Result Value Ref Range   Specimen Description      BLOOD LEFT ARM Performed at [facility] [address] [place], [place] [address]    Special Requests      BOTTLES DRAWN AEROBIC AND ANAEROBIC Blood Culture adequate volume Performed at [facility] [address] [place], [place] [address]    Culture      NO GROWTH < 12 HOURS Performed at [facility] [address] [place], [place] [address]    Report Status PENDING   Culture, blood (routine x 2)     Status: None (Preliminary result)   Collection Time: 08/15/19  7:26 PM   Specimen: BLOOD LEFT ARM  Result Value Ref Range   Specimen Description      BLOOD LEFT ARM Performed at [facility] [address] [place], [place] [address]    Special Requests      BOTTLES DRAWN  AEROBIC AND ANAEROBIC Blood Culture adequate volume Performed at [facility] [address] [place], [place] [address]    Culture      NO GROWTH < 12 HOURS Performed at [facility] [address], [place], [place] [address]    Report Status PENDING   Glucose, capillary     Status: Abnormal   Collection Time: 08/15/19 10:46 PM  Result Value Ref Range   Glucose-Capillary 133 (H) 70 - 99 mg/dL  Creatinine, serum     Status: None   Collection Time: 08/16/19  2:55 AM  Result Value Ref Range   Creatinine, Ser 0.97 0.61 - 1.24 mg/dL   GFR calc non Af Amer >60 >60 mL/min   GFR calc Af Amer >60 >60 mL/min  CBC     Status: Abnormal   Collection Time: 08/16/19  2:55 AM  Result Value Ref Range   WBC 5.9 4.0 - 10.5 K/uL   RBC 3.27 (L) 4.22 - 5.81 MIL/uL   Hemoglobin 10.4 (L) 13.0 - 17.0 g/dL   HCT 31.9 (L) 39.0 - 52.0 %   MCV 97.6 80.0 - 100.0 fL   MCH 31.8 26.0 - 34.0 pg   MCHC 32.6 30.0 - 36.0 g/dL   RDW 12.8 11.5 - 15.5 %   Platelets 141 (L) 150 - 400 K/uL   nRBC 0.0 0.0 - 0.2 %  Glucose,  capillary     Status: None   Collection Time: 08/16/19  7:43 AM  Result Value Ref Range   Glucose-Capillary 99 70 - 99 mg/dL  Glucose, capillary     Status: Abnormal   Collection Time: 08/16/19 12:03 PM  Result Value Ref Range   Glucose-Capillary 151 (H) 70 - 99 mg/dL  Glucose, capillary     Status: Abnormal   Collection Time: 08/16/19  4:59 PM  Result Value Ref Range   Glucose-Capillary 105 (H) 70 - 99 mg/dL    Recent Results (from the past 240 hour(s))  Culture, blood (routine x 2)     Status: None (Preliminary result)   Collection Time: 08/13/19  7:21 PM   Specimen: BLOOD  Result Value Ref Range Status   Specimen Description   Final    BLOOD RIGHT ANTECUBITAL Performed at [facility] [address], [place], [place] [address]    Special Requests   Final    BOTTLES DRAWN AEROBIC AND ANAEROBIC Blood Culture adequate volume Performed at  [facility] [address], [place], [place] [address]    Culture   Final    NO GROWTH 3 DAYS Performed at [facility], [place] [address], [place], [place] [address]    Report Status PENDING  Incomplete  Culture, blood (routine x 2)     Status: None (Preliminary result)   Collection Time: 08/13/19  7:24 PM   Specimen: BLOOD  Result Value Ref Range Status   Specimen Description   Final    BLOOD LEFT ANTECUBITAL Performed at [facility] [address], [place], [place] [address]    Special Requests   Final    BOTTLES DRAWN AEROBIC AND ANAEROBIC Blood Culture adequate volume Performed at [facility] [address] [place], [place] [address]    Culture   Final    NO GROWTH 3 DAYS Performed at [facility], [place] [address], [place], [place] [address]    Report Status PENDING  Incomplete  SARS CORONAVIRUS 2 (TAT 6-24 HRS) Nasopharyngeal Nasopharyngeal Swab     Status: None   Collection Time: 08/13/19  8:20 PM   Specimen: Nasopharyngeal Swab  Result Value Ref Range Status   SARS Coronavirus 2 NEGATIVE NEGATIVE Final    Comment: (NOTE) SARS-CoV-2 target nucleic acids are NOT DETECTED. The SARS-CoV-2 RNA is generally detectable in upper and lower respiratory specimens during the acute phase of infection. Negative results do not preclude SARS-CoV-2 infection, do not rule out co-infections with other pathogens, and should not be used as the sole basis for treatment or other patient management decisions. Negative results must be combined with clinical observations, patient history, and epidemiological information. The expected result is Negative. Fact Sheet for Patients: SugarRoll.be Fact Sheet for Healthcare Providers: https://www.woods-mathews.com/ This test is not yet approved or cleared by the [place] FDA and  has been authorized for detection and/or diagnosis of  SARS-CoV-2 by FDA under an Emergency Use Authorization (EUA). This EUA will remain  in effect (meaning this test can be used) for the duration of the COVID-19 declaration under Section 56 4(b)(1) of the Act, 21 U.S.C. section 360bbb-3(b)(1), unless the authorization is terminated or revoked sooner. Performed at [facility], [place] [address] [place],  [address]   MRSA PCR Screening     Status: None   Collection Time: 08/14/19  1:45 PM   Specimen: Nasal Mucosa; Nasopharyngeal  Result Value Ref Range  Status   MRSA by PCR NEGATIVE NEGATIVE Final    Comment:        The GeneXpert MRSA Assay (FDA approved for NASAL specimens only), is one component of a comprehensive MRSA colonization surveillance program. It is not intended to diagnose MRSA infection nor to guide or monitor treatment for MRSA infections. Performed at [facility], [place] [address], [place], [place] [address]   Aerobic/Anaerobic Culture (surgical/deep wound)     Status: None (Preliminary result)   Collection Time: 08/14/19  4:32 PM   Specimen: Soft Tissue, Other  Result Value Ref Range Status   Specimen Description   Final    TISSUE LEFT GREAT TOE Performed at [facility] [address], [place], [place] [address]    Special Requests   Final    NONE Performed at [facility], [place] [address] [place], [place] [address]    Gram Stain   Final    RARE WBC PRESENT, PREDOMINANTLY MONONUCLEAR RARE GRAM POSITIVE COCCI Performed at [facility], [place] [address], [place], [place] [address]    Culture   Final    FEW STAPHYLOCOCCUS AUREUS NO ANAEROBES ISOLATED; CULTURE IN PROGRESS FOR 5 DAYS    Report Status PENDING  Incomplete  Culture, blood (routine x 2)     Status: None (Preliminary result)   Collection Time: 08/15/19  7:17 PM   Specimen: BLOOD LEFT ARM  Result Value Ref Range Status   Specimen Description   Final    BLOOD LEFT ARM  Performed at [facility], [place] [address], [place], [place] [address]    Special Requests   Final    BOTTLES DRAWN AEROBIC AND ANAEROBIC Blood Culture adequate volume Performed at [facility] [address], [place], [place] [address]    Culture   Final    NO GROWTH < 12 HOURS Performed at [facility] [address], [place], [place] [address]    Report Status PENDING  Incomplete  Culture, blood (routine x 2)     Status: None (Preliminary result)   Collection Time: 08/15/19  7:26 PM   Specimen: BLOOD LEFT ARM  Result Value Ref Range Status   Specimen Description   Final    BLOOD LEFT ARM Performed at [facility], [place] [address] [place], [place] [address]    Special Requests   Final    BOTTLES DRAWN AEROBIC AND ANAEROBIC Blood Culture adequate volume Performed at [facility] [address] [place], [place] [address]    Culture   Final    NO GROWTH < 12 HOURS Performed at [facility] [address], [place],  [address]    Report Status PENDING  Incomplete    Assessment & Plan:  Patient was evaluated and treated and all questions answered.  POD#1 s/p L Great toe amputation for Left Great toe Osteomyelitis, abscess -Micro reviewed. Growing Staph, unclear Sensitivity. -Path confirmatory of OM with viable margin. -Ordered Vacutainer changed by RN. -Will pull drain tomorrow if minimal drainage noted. -Plan for 1 week of PO abx at d/c dependent upon culture results. -Will continue to follow. Dr. [name] to see patient tomorrow.  Evelina Bucy, DPM  Accessible via secure chat for questions or concerns.

## 2019-08-16 NOTE — Anesthesia Postprocedure Evaluation (Signed)
Anesthesia Post Note  Patient: Eugene Diaz  Procedure(s) Performed: AMPUTATION GREAT TOE (Left Toe)     Patient location during evaluation: PACU Anesthesia Type: MAC Level of consciousness: awake and alert Pain management: pain level controlled Vital Signs Assessment: post-procedure vital signs reviewed and stable Respiratory status: spontaneous breathing, nonlabored ventilation, respiratory function stable and patient connected to nasal cannula oxygen Cardiovascular status: stable and blood pressure returned to baseline Postop Assessment: no apparent nausea or vomiting Anesthetic complications: no    Last Vitals:  Vitals:   08/15/19 2244 08/16/19 0619  BP: 111/74 120/77  Pulse: 68 74  Resp: 16 17  Temp: 36.8 C 36.6 C  SpO2: 92% 100%    Last Pain:  Vitals:   08/16/19 0759  TempSrc:   PainSc: [place]

## 2019-08-16 NOTE — Evaluation (Signed)
Physical Therapy Evaluation Patient Details Name: Eugene Diaz MRN: [id number] DOB: [birth year] Today's Date: 08/16/2019   History of Present Illness  53 y.o. male with medical history significant of type 2 diabetes with peripheral neuropathy, hypertension, hyperlipidemia who presented as a direct admission from [facility] for concerns of left foot cellulitis and osteomyelitis.  Clinical Impression  Patient evaluated by Physical Therapy with no further acute PT needs identified. All education has been completed and the patient has no further questions. See below for any follow-up Physical Therapy or equipment needs. PT is signing off. Thank you for this referral.     Follow Up Recommendations No PT follow up    Equipment Recommendations  None recommended by PT    Recommendations for Other Services       Precautions / Restrictions Precautions Precautions: Fall Restrictions Weight Bearing Restrictions: No LLE Weight Bearing: Weight bearing as tolerated      Mobility  Bed Mobility Overal bed mobility: Needs Assistance Bed Mobility: Supine to Sit     Supine to sit: Independent        Transfers Overall transfer level: Independent   Transfers: Sit to/from Stand Sit to Stand: Independent         General transfer comment: no assist, cane used once in standing  Ambulation/Gait Ambulation/Gait assistance: Supervision;Modified independent (Device/Increase time) Gait Distance (Feet): 120 Feet Assistive device: Straight cane Gait Pattern/deviations: Decreased stance time - left;Decreased stride length;Decreased weight shift to left     General Gait Details: cues for decr step length, encouraged heel WBing and decr push off on L foot to protect surgical site; supervision for safety/IV  Stairs Stairs: Yes Stairs assistance: Supervision Stair Management: No rails;With cane Number of Stairs: 3 General stair comments: supervision for safety, no physical  assist  Wheelchair Mobility    Modified Rankin (Stroke Patients Only)       Balance Overall balance assessment: No apparent balance deficits (not formally assessed)(great toe amb however no LOB during PT eval)                           High level balance activites: Turns;Head turns;Backward walking;Side stepping High Level Balance Comments: no LOB with above, utilizind SPC intermittently             Pertinent Vitals/Pain Pain Assessment: No/denies pain    Home Living Family/patient expects to be discharged to:: Private residence Living Arrangements: Alone Available Help at Discharge: Family;Available PRN/intermittently Type of Home: Mobile home Home Access: Stairs to enter Entrance Stairs-Rails: None Entrance Stairs-Number of Steps: 2 Home Layout: One level Home Equipment: Cane - single point Additional Comments: pt's wife passed away 2 weeks ago. Kids have been checking on him regularly and friend "Grayland Ormond"    Prior Function Level of Independence: Independent with assistive device(s)         Comments: uses SPC     Hand Dominance        Extremity/Trunk Assessment   Upper Extremity Assessment Upper Extremity Assessment: Overall WFL for tasks assessed    Lower Extremity Assessment Lower Extremity Assessment: RLE deficits/detail;LLE deficits/detail RLE Deficits / Details: grossly WFL RLE Sensation: history of peripheral neuropathy LLE Deficits / Details: ankle df/pf limited to ~ 10 range, knee and hip grossly WFL LLE Sensation: history of peripheral neuropathy       Communication   Communication: No difficulties  Cognition Arousal/Alertness: Awake/alert Behavior During Therapy: WFL for tasks assessed/performed Overall Cognitive Status:  Within Functional Limits for tasks assessed                                        General Comments      Exercises     Assessment/Plan    PT Assessment Patent does not need any  further PT services  PT Problem List         PT Treatment Interventions      PT Goals (Current goals can be found in the Care Plan section)  Acute Rehab PT Goals PT Goal Formulation: All assessment and education complete, DC therapy    Frequency     Barriers to discharge        Co-evaluation               AM-PAC PT "6 Clicks" Mobility  Outcome Measure Help needed turning from your back to your side while in a flat bed without using bedrails?: None Help needed moving from lying on your back to sitting on the side of a flat bed without using bedrails?: None Help needed moving to and from a bed to a chair (including a wheelchair)?: None Help needed standing up from a chair using your arms (e.g., wheelchair or bedside chair)?: None Help needed to walk in hospital room?: None Help needed climbing 3-5 steps with a railing? : None 6 Click Score: 24    End of Session Equipment Utilized During Treatment: Gait belt Activity Tolerance: Patient tolerated treatment well Patient left: Other (comment)(in bathroom at pt request)   PT Visit Diagnosis: Difficulty in walking, not elsewhere classified (R26.2)    Time: [id number] PT Time Calculation (min) (ACUTE ONLY): 21 min   Charges:   PT Evaluation $PT Eval Low Complexity: 1 Low          Kenyon Ana, PT  Pager: [phone] Acute Rehab Dept [facility]): [phone]   08/16/2019   [facility] 08/16/2019, 1:55 PM

## 2019-08-17 DIAGNOSIS — A419 Sepsis, unspecified organism: Secondary | ICD-10-CM

## 2019-08-17 LAB — CBC
HCT: 32.2 % — ABNORMAL LOW (ref 39.0–52.0)
Hemoglobin: 10.4 g/dL — ABNORMAL LOW (ref 13.0–17.0)
MCH: 32 pg (ref 26.0–34.0)
MCHC: 32.3 g/dL (ref 30.0–36.0)
MCV: 99.1 fL (ref 80.0–100.0)
Platelets: 185 10*3/uL (ref 150–400)
RBC: 3.25 MIL/uL — ABNORMAL LOW (ref 4.22–5.81)
RDW: 12.7 % (ref 11.5–15.5)
WBC: 6.2 10*3/uL (ref 4.0–10.5)
nRBC: 0 % (ref 0.0–0.2)

## 2019-08-17 LAB — BASIC METABOLIC PANEL
Anion gap: 7 (ref 5–15)
BUN: 11 mg/dL (ref 6–20)
CO2: 29 mmol/L (ref 22–32)
Calcium: 8.8 mg/dL — ABNORMAL LOW (ref 8.9–10.3)
Chloride: 101 mmol/L (ref 98–111)
Creatinine, Ser: 0.87 mg/dL (ref 0.61–1.24)
GFR calc Af Amer: >60 mL/min (ref >60)
GFR calc non Af Amer: >60 mL/min (ref >60)
Glucose, Bld: 120 mg/dL — ABNORMAL HIGH (ref 70–99)
Potassium: 4.1 mmol/L (ref 3.5–5.1)
Sodium: 137 mmol/L (ref 135–145)

## 2019-08-17 LAB — GLUCOSE, CAPILLARY
Glucose-Capillary: 108 mg/dL — ABNORMAL HIGH (ref 70–99)
Glucose-Capillary: 132 mg/dL — ABNORMAL HIGH (ref 70–99)

## 2019-08-17 LAB — VANCOMYCIN, TROUGH: Vancomycin Tr: 17 ug/mL (ref 15–20)

## 2019-08-17 MED ORDER — VANCOMYCIN HCL 10 G IV SOLR
1250.0000 mg | Freq: Two times a day (BID) | INTRAVENOUS | Status: DC
  Filled 2019-08-17: qty 1250

## 2019-08-17 MED ORDER — SULFAMETHOXAZOLE-TRIMETHOPRIM 800-160 MG PO TABS: 1.0000 | ORAL_TABLET | Freq: Two times a day (BID) | ORAL | 0 refills | Status: DC

## 2019-08-17 MED ORDER — CLINDAMYCIN HCL 300 MG PO CAPS: 600.0000 mg | ORAL_CAPSULE | Freq: Three times a day (TID) | ORAL | 0 refills | Status: DC

## 2019-08-17 MED ORDER — CIPROFLOXACIN HCL 500 MG PO TABS: 500.0000 mg | ORAL_TABLET | Freq: Two times a day (BID) | ORAL | 0 refills | Status: AC

## 2019-08-17 NOTE — Progress Notes (Signed)
Pharmacy Antibiotic Note  Eugene Diaz is a 53 y.o. male admitted on 08/13/2019 with LLE cellulitis  & suspected osteomyelitis per foot Xray. PMH significant for DM and L great toe fracture on 07/16/19 s/p open reduction & pinning. Pharmacy has been consulted for Vancomycin & Zosyn dosing.  08/17/2019: MRI L foot (read 10/14) -osteo great toe, plantar abscess, tenosynovitis 10/14: L great toe amputation - Tissue cx with rare GPC - Staph aureus   Today, 08/17/2019:  Afebrile, last fevers 10/15 (POD1)  WBC stable WNL  SCr stable  Vanc levels obtained this AM show range 15-36 mcg/mL, AUC 581   Plan:  Reduce vancomycin to 1250 mg IV q12 hr (est AUC 484, range 13-30 mcg/ml based on levels)  Measure vancomycin AUC at steady state as indicated  Zosyn 3.375 g IV given once over 30 minutes, then every 8 hrs by 4-hr infusion  SCr daily while on vanc/Zosyn combined  F/u S aureus sensitivities and ability to narrow vanc (looks like good source control obtained as plan per DPM is 1 wk PO abx at discharge)  Height: 6\' 2"  (188 cm) Weight: 287 lb 0.6 oz (130.2 kg) IBW/kg (Calculated) : 82.2  Temp (24hrs), Avg:98.1 F (36.7 C), Min:97.5 F (36.4 C), Max:98.5 F (36.9 C)  Recent Labs  Lab 08/13/19 1905 08/13/19 1920 08/14/19 0532 08/15/19 0220 08/16/19 0255 08/16/19 2329 08/17/19 0633  WBC 7.4  --  5.6 5.5 5.9  --  6.2  CREATININE 0.95  --  0.99 0.89 0.97  --  0.87  LATICACIDVEN  --  1.0  --   --   --   --   --   VANCOTROUGH  --   --   --   --   --   --  17  VANCOPEAK  --   --   --   --   --  30  --     Estimated Creatinine Clearance: 139.2 mL/min (by C-G formula based on SCr of 0.87 mg/dL).    No Known Allergies  Antimicrobials this admission: 10/13 Vanc >>  10/14 Zosyn >>   Dose adjustments this admission: 2359 Vpk = 2329; Vtr = 17, AUC 583; Adjust to 1250 q12 (est AUC 484, Cpk 30, Ctr 13)  Microbiology results: 10/13 BCx: ngtd 10/13 COVID: neg 10/14 MRSA PCR:  neg 10/14 L toe tissue: few S aureus; f/u sens 10/15 BCx: ngtd   Thank you for allowing pharmacy to be a part of this patient's care.  Reuel Boom, PharmD, BCPS [phone] 08/17/2019, 10:09 AM

## 2019-08-17 NOTE — Progress Notes (Addendum)
  Subjective:  Patient ID: Eugene Diaz, male    DOB: 1966/08/25,  MRN: [id number]  Patient states that he is doing well.  No acute complaints.  He denies any nausea fever chills vomiting.  The drain is showing 0 output today.  Objective:   Vitals:   08/16/19 2234 08/17/19 0614  BP: 124/74 (!) 144/90  Pulse: 70 77  Resp:  18  Temp: 98.3 F (36.8 C) (!) 97.5 F (36.4 C)  SpO2: 98% 100%   General AA&O x3. Normal mood and affect.  Vascular Dorsalis pedis and posterior tibial pulses 2/4 bilat. Brisk capillary refill to all digits. Pedal hair present.  Neurologic  protective sensation diminished secondary to underlying diabetes  Dermatologic  dressing was taken down.  The amputation site is healing well.  Skin staples are intact.  Mild erythema noted at the incision site.  Cellulitis is regressing.  No purulent drainage malodor noted.  Drain site is intact no infection noted.  Orthopedic: MMT 5/5 in dorsiflexion, plantarflexion, inversion, and eversion. Normal joint ROM without pain or crepitus.    Assessment & Plan:  Patient was evaluated and treated and all questions answered.  A 53 y.o. male is status post Left great toe amputation secondary to left great toe osteomyelitis/abscess -Micro pathology reviewed.  Cultures and sensitivities are still pending. -Given that the cellulitis is regressing he is he can be discharged later today.  He will transition to p.o. antibiotics Clinda Cipro for next 14 days.  Will narrow in clinic based on cultures and sensitivities. -Drain was pulled at bedside no complications noted. -He will be weightbearing as tolerated in a cam boot at all times.  I explained to the patient do not get the dressings wet.  If they get wet I would like for him to come see [place] immediately. -[name] will see the patient this upcoming week in clinic.  He will be contacted with the appointment time. -He is okay to be discharged from podiatric standpoint.   [name], DPM  Accessible via secure chat for questions or concerns.

## 2019-08-17 NOTE — Discharge Summary (Signed)
Eugene Diaz [id number] DOB: 07-05-1966 DOA: 08/13/2019  PCP: [name], PA-C  Admit date: 08/13/2019 Discharge date: 08/17/2019  Admitted From: Home Disposition: Home  Recommendations for Outpatient Follow-up:  1. Follow up with PCP in 1-2 weeks 2. Please obtain BMP/CBC in one week 3. New medications: Ciprofloxacin and bactrim x 14 days, to follow up next week with podiatry to narrow abx based off sensitivities 4. Please follow up on the following pending results:  Home Health: None Equipment/Devices: Walking Cam boot, weightbearing as tolerated  Discharge Condition: Stable CODE STATUS: Full Diet recommendation: Carb modified Brief/Interim Summary: History of present illness:  Eugene Diaz is a 53 y.o. year old male with medical history significant for ype 2 diabetes with peripheral neuropathy, hypertension, hyperlipidemia who presented on 08/13/2019 as a direct [facility] due to concern for left foot cellulitis and possible osteomyelitis based off x-ray of foot in office.  Patient is status post open reduction/pinning incision and drainage of a recent closed displaced fracture of the left great toe (07/16/2019) seen in podiatry office on 10/9.  On arrival to [facility] long patient with fever of 103, tachycardic to 115,  And empirically started on IV Zosyn and vancomycin while awaiting MRI to rule out osteomyelitis.  MRI confirmed OM Status post left great toe amputation on 10/14.  Remaining hospital course addressed in problem based format below:   Hospital Course:    Sepsis secondary to left great toe osteomyelitis status post left great toe amputation and cellulitis.  Sepsis physiology resolved while on empiric IV vancomycin and Zosyn, redness improved after amputation on 10/14, briefly required drains for 48 hours postoperatively before removal on day of discharge.  Postoperative site looks well, will have outpatient follow-up  with podiatry.  Will continue coverage for cellulitis with Bactrim and ciprofloxacin while awaiting final biopsy results (GPCs with sensitivities pending on discharge) to be followed up with podiatry.   Alcohol abuse.  Admits to drinking approximately 6 to 712 ounce beers daily, last drink on day of admission.  No hypertension, no hallucinations, no active signs of withdrawal during hospital stay   Normocytic anemia.  Tends to fluctuate between 11 and 13 in the past month.  Denies any active bleeding.  B12 low, iron panel consistent with anemia of chronic disease.  Replete B12 in hospital.   Chronic mild hyponatremia, stable.  Remains asymptomatic.    Chronic thrombocytopenia, stable.  Currently stable at baseline.  No active signs of bleeding.  Suspect related to alcohol use,    Type 2 diabetes, well controlled.  A1c 6.6 (07/2019), sliding scale as needed during hospital stay, can resume oral hypoglycemics on discharge.     Hyperlipidemia, stable.  Continue Lipitor   Peripheral neuropathy, continue Cymbalta, continue gabapentin   Depression continue Elavil   Hypertension, stable.  Continue lisinopril   GERD, stable continue PPI   Consultations:  Podiatry  Procedures/Studies: Left great toe amputation, 10/14 Subjective: Feels fine No fevers Ready to go home Discharge Exam: Vitals:   08/16/19 2234 08/17/19 0614  BP: 124/74 (!) 144/90  Pulse: 70 77  Resp:  18  Temp: 98.3 F (36.8 C) (!) 97.5 F (36.4 C)  SpO2: 98% 100%   Vitals:   08/16/19 0619 08/16/19 1410 08/16/19 2234 08/17/19 0614  BP: 120/77 125/71 124/74 (!) 144/90  Pulse: 74 73 70 77  Resp: 17 14  18   Temp: 97.9 F (36.6 C) 98.5 F (36.9 C) 98.3 F (36.8  C) (!) 97.5 F (36.4 C)  TempSrc: Oral  Oral Oral  SpO2: 100% 100% 98% 100%  Weight:      Height:        Alert, oriented, no distress No tremors Clear breath sounds throughout, on room air, normal respiratory effort Regular rate  and rhythm, no rub rubs, no gallops, no edema Left foot with dressing in place, staples along postoperative site of left foot intact, without drainage, mild erythema along forefoot, nontender  Discharge Diagnoses:  Active Problems:   Hypertension   Thrombocytopenia (HCC)   ETOH abuse   GERD (gastroesophageal reflux disease)   Cellulitis   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)   Acute osteomyelitis of toe of left foot (HCC)   Sepsis (HCC)   Hyponatremia   Normocytic anemia   B12 deficiency   Acute osteomyelitis of left ankle or foot [facility])    Discharge Instructions  Discharge Instructions    Diet - low sodium heart healthy   Complete by: As directed    Increase activity slowly   Complete by: As directed      Allergies as of 08/17/2019   No Known Allergies     Medication List    STOP taking these medications   doxycycline 100 MG capsule Commonly known as: VIBRAMYCIN     TAKE these medications   albuterol 108 (90 Base) MCG/ACT inhaler Commonly known as: VENTOLIN HFA Inhale 2 puffs into the lungs every 6 (six) hours as needed for wheezing or shortness of breath.   amitriptyline 100 MG tablet Commonly known as: ELAVIL Take 100 mg by mouth at bedtime.   atorvastatin 40 MG tablet Commonly known as: LIPITOR Take 40 mg by mouth every evening.   canagliflozin 300 MG Tabs tablet Commonly known as: INVOKANA 300 mg daily before breakfast.   ciprofloxacin 500 MG tablet Commonly known as: Cipro Take 1 tablet (500 mg total) by mouth 2 (two) times daily for 14 days.   clindamycin 300 MG capsule Commonly known as: Cleocin Take 2 capsules (600 mg total) by mouth 3 (three) times daily for 14 days.   docusate sodium 100 MG capsule Commonly known as: COLACE Take 100 mg by mouth daily as needed for mild constipation.   DULoxetine 60 MG capsule Commonly known as: CYMBALTA Take 60 mg by mouth daily.   furosemide 20 MG tablet Commonly known as: LASIX Take  20 mg by mouth daily.   gabapentin 300 MG capsule Commonly known as: NEURONTIN Take 900mg  in the morning and at noon, then take 1200mg  at bedtime   glycopyrrolate 1 MG tablet Commonly known as: ROBINUL Take 1 mg by mouth 2 (two) times daily.   lidocaine 5 % ointment Commonly known as: XYLOCAINE APPLY TO AFFECTED AREAS TWICE DAILY AS NEEDED.   lisinopril 20 MG tablet Commonly known as: ZESTRIL Take 20 mg by mouth daily.   omeprazole 20 MG capsule Commonly known as: PRILOSEC Take 1 capsule (20 mg total) by mouth 2 (two) times daily before a meal. 1 PO 30 mins prior to breakfast and supper   oxyCODONE HCl 30 MG Taba Take 30 mg by mouth every 4 (four) hours as needed (for pain).   primidone 50 MG tablet Commonly known as: MYSOLINE Take 1 tablet (50 mg total) by mouth 2 (two) times daily.   Voltaren 1 % Gel Generic drug: diclofenac sodium Apply 2 g topically 4 (four) times daily.       No Known Allergies  The results of significant diagnostics from this hospitalization (including imaging, microbiology, ancillary and laboratory) are listed below for reference.     Microbiology: Recent Results (from the past 240 hour(s))  Culture, blood (routine x 2)     Status: None (Preliminary result)   Collection Time: 08/13/19  7:21 PM   Specimen: BLOOD  Result Value Ref Range Status   Specimen Description   Final    BLOOD RIGHT ANTECUBITAL Performed at [facility] [address], [place], [place] [address]    Special Requests   Final    BOTTLES DRAWN AEROBIC AND ANAEROBIC Blood Culture adequate volume Performed at [facility] [address] [place], [place] [address]    Culture   Final    NO GROWTH 4 DAYS Performed at [facility], [place] [address], [place], [place] [address]    Report Status PENDING  Incomplete  Culture, blood (routine x 2)     Status: None (Preliminary result)   Collection Time: 08/13/19  7:24 PM    Specimen: BLOOD  Result Value Ref Range Status   Specimen Description   Final    BLOOD LEFT ANTECUBITAL Performed at [facility] [address] [place], [place] [address]    Special Requests   Final    BOTTLES DRAWN AEROBIC AND ANAEROBIC Blood Culture adequate volume Performed at [facility] [address], [place], [place] [address]    Culture   Final    NO GROWTH 4 DAYS Performed at [facility], [place] [address] [place], [place] [address]    Report Status PENDING  Incomplete  SARS CORONAVIRUS 2 (TAT 6-24 HRS) Nasopharyngeal Nasopharyngeal Swab     Status: None   Collection Time: 08/13/19  8:20 PM   Specimen: Nasopharyngeal Swab  Result Value Ref Range Status   SARS Coronavirus 2 NEGATIVE NEGATIVE Final    Comment: (NOTE) SARS-CoV-2 target nucleic acids are NOT DETECTED. The SARS-CoV-2 RNA is generally detectable in upper and lower respiratory specimens during the acute phase of infection. Negative results do not preclude SARS-CoV-2 infection, do not rule out co-infections with other pathogens, and should not be used as the sole basis for treatment or other patient management decisions. Negative results must be combined with clinical observations, patient history, and epidemiological information. The expected result is Negative. Fact Sheet for Patients: SugarRoll.be Fact Sheet for Healthcare Providers: https://www.woods-mathews.com/ This test is not yet approved or cleared by the [place] FDA and  has been authorized for detection and/or diagnosis of SARS-CoV-2 by FDA under an Emergency Use Authorization (EUA). This EUA will remain  in effect (meaning this test can be used) for the duration of the COVID-19 declaration under Section 56 4(b)(1) of the Act, 21 U.S.C. section 360bbb-3(b)(1), unless the authorization is terminated or revoked sooner. Performed at [facility],  [place] [address] [place], [place] [address]   MRSA PCR Screening     Status: None   Collection Time: 08/14/19  1:45 PM   Specimen: Nasal Mucosa; Nasopharyngeal  Result Value Ref Range Status   MRSA by PCR NEGATIVE NEGATIVE Final    Comment:        The GeneXpert MRSA Assay (FDA approved for NASAL specimens only), is one component of a comprehensive MRSA colonization surveillance program. It is not intended to diagnose MRSA infection nor to guide or monitor treatment for MRSA infections. Performed at [facility], [address] [place],  [place] [address]   Aerobic/Anaerobic Culture (surgical/deep wound)     Status: None (Preliminary result)   Collection Time: 08/14/19  4:32 PM   Specimen: Soft Tissue, Other  Result Value Ref Range Status   Specimen Description TISSUE LEFT GREAT TOE  Final   Special Requests NONE  Final   Gram Stain   Final    RARE WBC PRESENT, PREDOMINANTLY MONONUCLEAR RARE GRAM POSITIVE COCCI    Culture   Final    FEW STAPHYLOCOCCUS AUREUS NO ANAEROBES ISOLATED; CULTURE IN PROGRESS FOR 5 DAYS    Report Status PENDING  Incomplete   Organism ID, Bacteria STAPHYLOCOCCUS AUREUS  Final      Susceptibility   Staphylococcus aureus - MIC*    CIPROFLOXACIN <=0.5 SENSITIVE Sensitive     ERYTHROMYCIN <=0.25 SENSITIVE Sensitive     GENTAMICIN <=0.5 SENSITIVE Sensitive     OXACILLIN 0.5 SENSITIVE Sensitive     TETRACYCLINE >=16 RESISTANT Resistant     VANCOMYCIN 1 SENSITIVE Sensitive     TRIMETH/SULFA <=10 SENSITIVE Sensitive     CLINDAMYCIN <=0.25 SENSITIVE Sensitive     RIFAMPIN <=0.5 SENSITIVE Sensitive     Inducible Clindamycin Value in next row Sensitive      NEGATIVEPerformed at [facility], [address] [place], [place] [address]    * FEW STAPHYLOCOCCUS AUREUS  Culture, blood (routine x 2)     Status: None (Preliminary result)   Collection Time: 08/15/19  7:17 PM   Specimen: BLOOD LEFT ARM  Result Value Ref Range Status    Specimen Description   Final    BLOOD LEFT ARM Performed at [facility], [place] [address] [place], [place] [address]    Special Requests   Final    BOTTLES DRAWN AEROBIC AND ANAEROBIC Blood Culture adequate volume Performed at [facility] [address] [place], [place] [address]    Culture   Final    NO GROWTH 2 DAYS Performed at [facility] [address], [place], [place] [address]    Report Status PENDING  Incomplete  Culture, blood (routine x 2)     Status: None (Preliminary result)   Collection Time: 08/15/19  7:26 PM   Specimen: BLOOD LEFT ARM  Result Value Ref Range Status   Specimen Description   Final    BLOOD LEFT ARM Performed at [facility], [place] [address], [place], [place] [address]    Special Requests   Final    BOTTLES DRAWN AEROBIC AND ANAEROBIC Blood Culture adequate volume Performed at [facility] [address] [place], [place] [address]    Culture   Final    NO GROWTH 2 DAYS Performed at [facility] [address] [place],  [address]    Report Status PENDING  Incomplete     Labs: BNP (last 3 results) Recent Labs    07/15/19 0424 07/16/19 0224 07/17/19 0315  BNP 130.1* 77.2 [id number]*   Basic Metabolic Panel: Recent Labs  Lab 08/13/19 1905 08/14/19 0532 08/15/19 0220 08/16/19 0255 08/17/19 0633  NA 130* 133* 133*  --  137  K 3.5 3.2* 3.8  --  4.1  CL 96* 99 101  --  101  CO2 23 25 24   --  29  GLUCOSE 136* 134* 124*  --  120*  BUN 15 18 13   --  11  CREATININE 0.95 0.99 0.89 0.97 0.87  CALCIUM 8.8* 8.4* 8.5*  --  8.8*  MG  --  1.8  --   --   --    Liver Function Tests: Recent Labs  Lab 08/13/19 1905  AST 16  ALT 13  ALKPHOS 73  BILITOT 0.9  PROT 6.9  ALBUMIN 3.2*   No results for input(s): LIPASE, AMYLASE in the last 168 hours. No results for input(s): AMMONIA in the last 168 hours. CBC: Recent Labs  Lab 08/13/19 1905 08/13/19 2046 08/14/19 0532  08/15/19 0220 08/16/19 0255 08/17/19 0633  WBC 7.4  --  5.6 5.5 5.9 6.2  HGB 11.3*  --  10.6* 9.9* 10.4* 10.4*  HCT 33.1* 33.5* 32.5* 30.3* 31.9* 32.2*  MCV 93.0  --  97.0 96.8 97.6 99.1  PLT 137*  --  119* 126* 141* 185   Cardiac Enzymes: No results for input(s): CKTOTAL, CKMB, CKMBINDEX, TROPONINI in the last 168 hours. BNP: Invalid input(s): POCBNP CBG: Recent Labs  Lab 08/16/19 1203 08/16/19 1659 08/16/19 2235 08/17/19 0732 08/17/19 1202  GLUCAP 151* 105* 170* 108* 132*   D-Dimer No results for input(s): DDIMER in the last 72 hours. Hgb A1c No results for input(s): HGBA1C in the last 72 hours. Lipid Profile No results for input(s): CHOL, HDL, LDLCALC, TRIG, CHOLHDL, LDLDIRECT in the last 72 hours. Thyroid function studies No results for input(s): TSH, T4TOTAL, T3FREE, THYROIDAB in the last 72 hours.  Invalid input(s): FREET3 Anemia work up No results for input(s): VITAMINB12, FOLATE, FERRITIN, TIBC, IRON, RETICCTPCT in the last 72 hours. Urinalysis    Component Value Date/Time   COLORURINE YELLOW 08/14/2019 1232   APPEARANCEUR CLEAR 08/14/2019 1232   LABSPEC 1.020 08/14/2019 1232   PHURINE 5.0 08/14/2019 1232   GLUCOSEU NEGATIVE 08/14/2019 1232   HGBUR NEGATIVE 08/14/2019 1232   BILIRUBINUR NEGATIVE 08/14/2019 1232   KETONESUR NEGATIVE 08/14/2019 1232   PROTEINUR NEGATIVE 08/14/2019 1232   UROBILINOGEN 4.0 (H) 10/09/2008 1114   NITRITE NEGATIVE 08/14/2019 1232   LEUKOCYTESUR NEGATIVE 08/14/2019 1232   Sepsis Labs Invalid input(s): PROCALCITONIN,  WBC,  LACTICIDVEN Microbiology Recent Results (from the past 240 hour(s))  Culture, blood (routine x 2)     Status: None (Preliminary result)   Collection Time: 08/13/19  7:21 PM   Specimen: BLOOD  Result Value Ref Range Status   Specimen Description   Final    BLOOD RIGHT ANTECUBITAL Performed at [facility], [place] [address], [place], [place] [address]    Special Requests   Final     BOTTLES DRAWN AEROBIC AND ANAEROBIC Blood Culture adequate volume Performed at [facility] [address] [place], [place] [address]    Culture   Final    NO GROWTH 4 DAYS Performed at [facility], [place] [address], [place], [place] [address]    Report Status PENDING  Incomplete  Culture, blood (routine x 2)     Status: None (Preliminary result)   Collection Time: 08/13/19  7:24 PM   Specimen: BLOOD  Result Value Ref Range Status   Specimen Description   Final    BLOOD LEFT ANTECUBITAL Performed at [facility] [address], [place], [place] [address]    Special Requests   Final    BOTTLES DRAWN AEROBIC AND ANAEROBIC Blood Culture adequate volume Performed at [facility] [address] [place], [place] [address]    Culture   Final    NO GROWTH 4 DAYS Performed at [facility], [place] [address], [place], [place]  [address]    Report Status PENDING  Incomplete  SARS CORONAVIRUS 2 (TAT 6-24 HRS) Nasopharyngeal Nasopharyngeal Swab     Status: None   Collection Time: 08/13/19  8:20 PM   Specimen: Nasopharyngeal Swab  Result Value Ref Range Status   SARS Coronavirus 2 NEGATIVE NEGATIVE Final    Comment: (NOTE) SARS-CoV-2 target nucleic acids are NOT DETECTED. The SARS-CoV-2 RNA is generally detectable in upper and lower respiratory specimens during the acute phase of infection. Negative results do not preclude SARS-CoV-2 infection, do not rule out co-infections with other pathogens, and should not be used as the sole basis for treatment or other patient management decisions. Negative results must be combined with clinical observations, patient history, and epidemiological information. The expected result is Negative. Fact Sheet for Patients: SugarRoll.be Fact Sheet for Healthcare Providers: https://www.woods-mathews.com/ This test is not yet approved or cleared by  the [place] FDA and  has been authorized for detection and/or diagnosis of SARS-CoV-2 by FDA under an Emergency Use Authorization (EUA). This EUA will remain  in effect (meaning this test can be used) for the duration of the COVID-19 declaration under Section 56 4(b)(1) of the Act, 21 U.S.C. section 360bbb-3(b)(1), unless the authorization is terminated or revoked sooner. Performed at [facility], [place] [address] [place], [place] [address]   MRSA PCR Screening     Status: None   Collection Time: 08/14/19  1:45 PM   Specimen: Nasal Mucosa; Nasopharyngeal  Result Value Ref Range Status   MRSA by PCR NEGATIVE NEGATIVE Final    Comment:        The GeneXpert MRSA Assay (FDA approved for NASAL specimens only), is one component of a comprehensive MRSA colonization surveillance program. It is not intended to diagnose MRSA infection nor to guide or monitor treatment for MRSA infections. Performed at [facility], [place] [address] [place], [place] [address]   Aerobic/Anaerobic Culture (surgical/deep wound)     Status: None (Preliminary result)   Collection Time: 08/14/19  4:32 PM   Specimen: Soft Tissue, Other  Result Value Ref Range Status   Specimen Description TISSUE LEFT GREAT TOE  Final   Special Requests NONE  Final   Gram Stain   Final    RARE WBC PRESENT, PREDOMINANTLY MONONUCLEAR RARE GRAM POSITIVE COCCI    Culture   Final    FEW STAPHYLOCOCCUS AUREUS NO ANAEROBES ISOLATED; CULTURE IN PROGRESS FOR 5 DAYS    Report Status PENDING  Incomplete   Organism ID, Bacteria STAPHYLOCOCCUS AUREUS  Final      Susceptibility   Staphylococcus aureus - MIC*    CIPROFLOXACIN <=0.5 SENSITIVE Sensitive     ERYTHROMYCIN <=0.25 SENSITIVE Sensitive     GENTAMICIN <=0.5 SENSITIVE Sensitive     OXACILLIN 0.5 SENSITIVE Sensitive     TETRACYCLINE >=16 RESISTANT Resistant     VANCOMYCIN 1 SENSITIVE Sensitive     TRIMETH/SULFA <=10 SENSITIVE Sensitive      CLINDAMYCIN <=0.25 SENSITIVE Sensitive     RIFAMPIN <=0.5 SENSITIVE Sensitive     Inducible Clindamycin Value in next row Sensitive      NEGATIVEPerformed at [facility] [address], [place],  [address]    * FEW STAPHYLOCOCCUS AUREUS  Culture, blood (routine x 2)     Status: None (Preliminary result)   Collection Time: 08/15/19  7:17 PM   Specimen: BLOOD LEFT ARM  Result Value Ref Range Status   Specimen Description   Final  BLOOD LEFT ARM Performed at [facility], [place] [address], [place], [place] [address]    Special Requests   Final    BOTTLES DRAWN AEROBIC AND ANAEROBIC Blood Culture adequate volume Performed at [facility] [address], [place], [place] [address]    Culture   Final    NO GROWTH 2 DAYS Performed at [facility] [address] [place], [place] [address]    Report Status PENDING  Incomplete  Culture, blood (routine x 2)     Status: None (Preliminary result)   Collection Time: 08/15/19  7:26 PM   Specimen: BLOOD LEFT ARM  Result Value Ref Range Status   Specimen Description   Final    BLOOD LEFT ARM Performed at [facility], [place] [address], [place], [place] [address]    Special Requests   Final    BOTTLES DRAWN AEROBIC AND ANAEROBIC Blood Culture adequate volume Performed at [facility] [address], [place], [place] [address]    Culture   Final    NO GROWTH 2 DAYS Performed at [facility] [address] [place], [place] [address]    Report Status PENDING  Incomplete     Time coordinating discharge: Over 30 minutes  SIGNED:   Desiree Hane, MD  [facility] 08/17/2019, 12:13 PM Pager   If 7PM-7AM, please contact night-coverage [email or web address] Password [email or web address]

## 2019-08-18 LAB — CULTURE, BLOOD (ROUTINE X 2)
Culture: NO GROWTH
Culture: NO GROWTH
Special Requests: ADEQUATE
Special Requests: ADEQUATE

## 2019-08-19 LAB — AEROBIC/ANAEROBIC CULTURE W GRAM STAIN (SURGICAL/DEEP WOUND)

## 2019-08-20 LAB — CULTURE, BLOOD (ROUTINE X 2)
Culture: NO GROWTH
Culture: NO GROWTH
Special Requests: ADEQUATE
Special Requests: ADEQUATE

## 2019-08-21 ENCOUNTER — Telehealth: Payer: Self-pay | Admitting: *Deleted

## 2019-08-21 NOTE — Telephone Encounter (Signed)
Left message on pt's home phone to stop the CIPROFLOXACIN. Pt called while I was leaving the message and I informed him of Dr. Serita Grit orders to stop the Ciprofloxacin, and reminded him of the 08/23/2019 at 2:40pm.

## 2019-08-21 NOTE — Telephone Encounter (Signed)
Dr. [name] May know more about this patient to give a recommendation on Antibiotic -Dr Chauncey Cruel

## 2019-08-21 NOTE — Telephone Encounter (Signed)
Pt states he has two antibiotic and [name] told him to stop one. I told pt I would ask Dr. [name], who saw him in the hospital also and I would call him again.

## 2019-08-21 NOTE — Telephone Encounter (Signed)
Dr. [name] Secure Chat message to have pt stop the Cipro.

## 2019-08-23 ENCOUNTER — Ambulatory Visit (INDEPENDENT_AMBULATORY_CARE_PROVIDER_SITE_OTHER): Payer: Self-pay | Admitting: Podiatry

## 2019-08-23 ENCOUNTER — Other Ambulatory Visit: Payer: Self-pay

## 2019-08-23 ENCOUNTER — Ambulatory Visit (INDEPENDENT_AMBULATORY_CARE_PROVIDER_SITE_OTHER): Payer: Medicaid Other

## 2019-08-23 ENCOUNTER — Encounter: Payer: Self-pay | Admitting: Podiatry

## 2019-08-23 VITALS — Temp 97.9°F

## 2019-08-23 DIAGNOSIS — L03032 Cellulitis of left toe: Secondary | ICD-10-CM | POA: Diagnosis not present

## 2019-08-23 DIAGNOSIS — L02612 Cutaneous abscess of left foot: Secondary | ICD-10-CM | POA: Diagnosis not present

## 2019-08-23 NOTE — Progress Notes (Signed)
Subjective:  Patient ID: Eugene Diaz, male    DOB: 10/09/1966,  MRN: [id number]  Chief Complaint  Patient presents with  . Routine Post Op    DOS 08/13/2019 PARTIAL RAY AMPUTATION OF LEFT FOOT. Pt states he has been feeling lightheaded all day. No other concerns.      53 y.o. male returns for post-op check.  He is doing pretty well.  He denies any nausea fever chills vomiting.  He states that has been ambulating with a cam boot only.  He has been keeping it dry.  Review of Systems: Negative except as noted in the HPI. Denies N/V/F/Ch.  Past Medical History:  Diagnosis Date  . Anxiety   . Arthritis   . Asthma   . Depression   . Diabetes mellitus without complication ([place]) 03/3975  . Hypercholesterolemia   . Hypertension   . Neuropathy   . Peripheral vascular disease ([place])     Current Outpatient Medications:  .  ACCU-CHEK GUIDE test strip, USE TO TEST BLOOD SUGARTDAILY AS DIRECTED., Disp: , Rfl:  .  Accu-Chek Softclix Lancets lancets, USE TO TEST BLOOD SUGARADAILY AS DIRECTED., Disp: , Rfl:  .  albuterol (PROVENTIL HFA;VENTOLIN HFA) 108 (90 Base) MCG/ACT inhaler, Inhale 2 puffs into the lungs every 6 (six) hours as needed for wheezing or shortness of breath., Disp: 1 Inhaler, Rfl: 2 .  amitriptyline (ELAVIL) 100 MG tablet, Take 100 mg by mouth at bedtime., Disp: , Rfl:  .  atorvastatin (LIPITOR) 40 MG tablet, Take 40 mg by mouth every evening. , Disp: , Rfl: 10 .  Blood Glucose Monitoring Suppl (ACCU-CHEK GUIDE) w/Device KIT, USE TO TEST BLOOD SUGARAAS DIRECTED BY DOCTOR., Disp: , Rfl:  .  canagliflozin (INVOKANA) 300 MG TABS tablet, 300 mg daily before breakfast. , Disp: , Rfl:  .  ciprofloxacin (CIPRO) 500 MG tablet, Take 1 tablet (500 mg total) by mouth 2 (two) times daily for 14 days., Disp: 28 tablet, Rfl: 0 .  docusate sodium (COLACE) 100 MG capsule, Take 100 mg by mouth daily as needed for mild constipation., Disp: , Rfl:  .  DULoxetine (CYMBALTA) 60 MG capsule, Take 60  mg by mouth daily. , Disp: , Rfl: 10 .  furosemide (LASIX) 20 MG tablet, Take 20 mg by mouth daily. , Disp: , Rfl:  .  gabapentin (NEURONTIN) 300 MG capsule, Take 961m in the morning and at noon, then take 12013mat bedtime, Disp: 300 capsule, Rfl: 11 .  glimepiride (AMARYL) 4 MG tablet, Take 4 mg by mouth daily., Disp: , Rfl:  .  glycopyrrolate (ROBINUL) 1 MG tablet, Take 1 mg by mouth 2 (two) times daily. , Disp: , Rfl: 2 .  hydrOXYzine (ATARAX/VISTARIL) 25 MG tablet, TAKE 1 TABLET BY MOUTH 4OTIMES DAILY AS NEEDED FORUANXIETY. MAY ADD TO BUSPIRONE AND XANAX DURING TAPER., Disp: , Rfl:  .  lidocaine (XYLOCAINE) 5 % ointment, APPLY TO AFFECTED AREAS TWICE DAILY AS NEEDED., Disp: 70.88 g, Rfl: 3 .  lisinopril (PRINIVIL,ZESTRIL) 20 MG tablet, Take 20 mg by mouth daily. , Disp: , Rfl: 4 .  omeprazole (PRILOSEC) 20 MG capsule, Take 1 capsule (20 mg total) by mouth 2 (two) times daily before a meal. 1 PO 30 mins prior to breakfast and supper, Disp: 60 capsule, Rfl: 5 .  oxyCODONE HCl 30 MG TABA, Take 30 mg by mouth every 4 (four) hours as needed (for pain). , Disp: , Rfl: 0 .  primidone (MYSOLINE) 50 MG tablet, Take 1 tablet (50  mg total) by mouth 2 (two) times daily., Disp: 60 tablet, Rfl: 11 .  sulfamethoxazole-trimethoprim (BACTRIM DS) 800-160 MG tablet, Take 1 tablet by mouth 2 (two) times daily for 14 days., Disp: 28 tablet, Rfl: 0 .  VOLTAREN 1 % GEL, Apply 2 g topically 4 (four) times daily. , Disp: , Rfl:   Social History   Tobacco Use  Smoking Status Former Smoker  . Packs/day: 2.50  . Years: 25.00  . Pack years: 62.50  . Types: Cigarettes  . Quit date: 08/05/1984  . Years since quitting: 35.0  Smokeless Tobacco Never Used    No Known Allergies Objective:   Vitals:   08/23/19 1511  Temp: 97.9 F (36.6 C)   There is no height or weight on file to calculate BMI. Constitutional Well developed. Well nourished.  Vascular Foot warm and well perfused. Capillary refill normal to  all digits.   Neurologic Normal speech. Oriented to person, place, and time. Epicritic sensation to light touch grossly present bilaterally.  Dermatologic Skin healing well without signs of infection. Skin edges well coapted without signs of infection.  Orthopedic: Tenderness to palpation noted about the surgical site.   Radiographs: 3 views of skeletally mature adult: The amputation site is with clear margins.  No signs of cortical irregularity ruling out osteomyelitis.  No foreign body no soft tissue gas noted.  Soft tissue swelling noted. Assessment:   1. Cellulitis and abscess of toe of left foot    Plan:  Patient was evaluated and treated and all questions answered.  S/p foot surgery left -Progressing as expected post-operatively. -XR: See above -WB Status: Weightbearing as tolerated in cam boot in  -Sutures: Staples and sutures are intact.  Mild dehiscence noted centrally.  No clinical signs of infection.  Betadine wet-to-dry aggressive dressings was applied. -Medications: Continue taking his antibiotics. -Foot redressed.  No follow-ups on file.

## 2019-08-23 NOTE — Telephone Encounter (Signed)
[name] was informed of pt's [facility] room assignment.

## 2019-08-31 ENCOUNTER — Encounter: Payer: Self-pay | Admitting: Podiatry

## 2019-08-31 ENCOUNTER — Other Ambulatory Visit: Payer: Self-pay

## 2019-08-31 ENCOUNTER — Ambulatory Visit (INDEPENDENT_AMBULATORY_CARE_PROVIDER_SITE_OTHER): Payer: Medicaid Other | Admitting: Podiatry

## 2019-08-31 DIAGNOSIS — T8130XA Disruption of wound, unspecified, initial encounter: Secondary | ICD-10-CM

## 2019-08-31 MED ORDER — SULFAMETHOXAZOLE-TRIMETHOPRIM 800-160 MG PO TABS: 1.0000 | ORAL_TABLET | Freq: Two times a day (BID) | ORAL | 0 refills | Status: DC

## 2019-08-31 NOTE — Progress Notes (Signed)
Subjective:  Patient ID: Eugene Diaz, male    DOB: 1966-10-21,  MRN: [id number]  Chief Complaint  Patient presents with  . Foot Ulcer    the left big toe area is not looking so good and does have some draining to it     DOS: 07/16/2019 Procedure: Open reduction, perc pinning left hallux fracture, incision and drainage left hallux.  53 y.o. male returns for post-op check.  States that the dressing came off shortly after his last visit he had to change himself.  Thinks is not doing so well.  States that he walks at home when he has to go to the bathroom without his boot on.  Can't feel pressure they present his foot due to neuropathy  Review of Systems: Negative except as noted in the HPI. Denies N/V/F/Ch.  Past Medical History:  Diagnosis Date  . Anxiety   . Arthritis   . Asthma   . Depression   . Diabetes mellitus without complication ([place]) 05/997  . Hypercholesterolemia   . Hypertension   . Neuropathy   . Peripheral vascular disease ([place])     Current Outpatient Medications:  .  ACCU-CHEK GUIDE test strip, USE TO TEST BLOOD SUGARTDAILY AS DIRECTED., Disp: , Rfl:  .  Accu-Chek Softclix Lancets lancets, USE TO TEST BLOOD SUGARADAILY AS DIRECTED., Disp: , Rfl:  .  albuterol (PROVENTIL HFA;VENTOLIN HFA) 108 (90 Base) MCG/ACT inhaler, Inhale 2 puffs into the lungs every 6 (six) hours as needed for wheezing or shortness of breath., Disp: 1 Inhaler, Rfl: 2 .  amitriptyline (ELAVIL) 100 MG tablet, Take 100 mg by mouth at bedtime., Disp: , Rfl:  .  atorvastatin (LIPITOR) 40 MG tablet, Take 40 mg by mouth every evening. , Disp: , Rfl: 10 .  Blood Glucose Monitoring Suppl (ACCU-CHEK GUIDE) w/Device KIT, USE TO TEST BLOOD SUGARAAS DIRECTED BY DOCTOR., Disp: , Rfl:  .  canagliflozin (INVOKANA) 300 MG TABS tablet, 300 mg daily before breakfast. , Disp: , Rfl:  .  ciprofloxacin (CIPRO) 500 MG tablet, Take 1 tablet (500 mg total) by mouth 2 (two) times daily for 14 days., Disp: 28 tablet,  Rfl: 0 .  docusate sodium (COLACE) 100 MG capsule, Take 100 mg by mouth daily as needed for mild constipation., Disp: , Rfl:  .  DULoxetine (CYMBALTA) 60 MG capsule, Take 60 mg by mouth daily. , Disp: , Rfl: 10 .  furosemide (LASIX) 20 MG tablet, Take 20 mg by mouth daily. , Disp: , Rfl:  .  gabapentin (NEURONTIN) 300 MG capsule, Take 943m in the morning and at noon, then take 12051mat bedtime, Disp: 300 capsule, Rfl: 11 .  glimepiride (AMARYL) 4 MG tablet, Take 4 mg by mouth daily., Disp: , Rfl:  .  glycopyrrolate (ROBINUL) 1 MG tablet, Take 1 mg by mouth 2 (two) times daily. , Disp: , Rfl: 2 .  hydrOXYzine (ATARAX/VISTARIL) 25 MG tablet, TAKE 1 TABLET BY MOUTH 4OTIMES DAILY AS NEEDED FORUANXIETY. MAY ADD TO BUSPIRONE AND XANAX DURING TAPER., Disp: , Rfl:  .  lidocaine (XYLOCAINE) 5 % ointment, APPLY TO AFFECTED AREAS TWICE DAILY AS NEEDED., Disp: 70.88 g, Rfl: 3 .  lisinopril (PRINIVIL,ZESTRIL) 20 MG tablet, Take 20 mg by mouth daily. , Disp: , Rfl: 4 .  omeprazole (PRILOSEC) 20 MG capsule, Take 1 capsule (20 mg total) by mouth 2 (two) times daily before a meal. 1 PO 30 mins prior to breakfast and supper, Disp: 60 capsule, Rfl: 5 .  oxyCODONE HCl  30 MG TABA, Take 30 mg by mouth every 4 (four) hours as needed (for pain). , Disp: , Rfl: 0 .  primidone (MYSOLINE) 50 MG tablet, Take 1 tablet (50 mg total) by mouth 2 (two) times daily., Disp: 60 tablet, Rfl: 11 .  sulfamethoxazole-trimethoprim (BACTRIM DS) 800-160 MG tablet, Take 1 tablet by mouth 2 (two) times daily for 14 days., Disp: 28 tablet, Rfl: 0 .  VOLTAREN 1 % GEL, Apply 2 g topically 4 (four) times daily. , Disp: , Rfl:   Social History   Tobacco Use  Smoking Status Former Smoker  . Packs/day: 2.50  . Years: 25.00  . Pack years: 62.50  . Types: Cigarettes  . Quit date: 08/05/1984  . Years since quitting: 35.0  Smokeless Tobacco Never Used    No Known Allergies Objective:  There were no vitals filed for this visit. There is no  height or weight on file to calculate BMI. Constitutional Well developed. Well nourished.  Vascular Foot warm and well perfused. Capillary refill normal to all digits.   Neurologic Normal speech. Oriented to person, place, and time. Epicritic sensation to light touch grossly present bilaterally.  Dermatologic  partial-thickness dehiscence about the first metatarsal amputation area without warmth erythema or signs of acute infection no bone exposure  Orthopedic:  History of left hallux amputation   Radiographs: No x-rays taken today Assessment:   1. Wound dehiscence    Plan:  Patient was evaluated and treated and all questions answered.  S/p foot surgery left -Since last visit area appears to have worsening dehiscence.  This appears split-thickness in the bone is not exposed.  I do think given his history and noncompliance he would benefit from debridement and repair of the wound dehiscence.  New wet Betadine wet-to-dry dressing applied today.  Patient to leave intact until follow-up.  Requested time for the procedure to be done next week.  No follow-ups on file.

## 2019-08-31 NOTE — H&P (View-Only) (Signed)
Subjective:  Patient ID: Eugene Diaz, male    DOB: 1966-10-21,  MRN: [id number]  Chief Complaint  Patient presents with  . Foot Ulcer    the left big toe area is not looking so good and does have some draining to it     DOS: 07/16/2019 Procedure: Open reduction, perc pinning left hallux fracture, incision and drainage left hallux.  53 y.o. male returns for post-op check.  States that the dressing came off shortly after his last visit he had to change himself.  Thinks is not doing so well.  States that he walks at home when he has to go to the bathroom without his boot on.  Can't feel pressure they present his foot due to neuropathy  Review of Systems: Negative except as noted in the HPI. Denies N/V/F/Ch.  Past Medical History:  Diagnosis Date  . Anxiety   . Arthritis   . Asthma   . Depression   . Diabetes mellitus without complication ([place]) 05/997  . Hypercholesterolemia   . Hypertension   . Neuropathy   . Peripheral vascular disease ([place])     Current Outpatient Medications:  .  ACCU-CHEK GUIDE test strip, USE TO TEST BLOOD SUGARTDAILY AS DIRECTED., Disp: , Rfl:  .  Accu-Chek Softclix Lancets lancets, USE TO TEST BLOOD SUGARADAILY AS DIRECTED., Disp: , Rfl:  .  albuterol (PROVENTIL HFA;VENTOLIN HFA) 108 (90 Base) MCG/ACT inhaler, Inhale 2 puffs into the lungs every 6 (six) hours as needed for wheezing or shortness of breath., Disp: 1 Inhaler, Rfl: 2 .  amitriptyline (ELAVIL) 100 MG tablet, Take 100 mg by mouth at bedtime., Disp: , Rfl:  .  atorvastatin (LIPITOR) 40 MG tablet, Take 40 mg by mouth every evening. , Disp: , Rfl: 10 .  Blood Glucose Monitoring Suppl (ACCU-CHEK GUIDE) w/Device KIT, USE TO TEST BLOOD SUGARAAS DIRECTED BY DOCTOR., Disp: , Rfl:  .  canagliflozin (INVOKANA) 300 MG TABS tablet, 300 mg daily before breakfast. , Disp: , Rfl:  .  ciprofloxacin (CIPRO) 500 MG tablet, Take 1 tablet (500 mg total) by mouth 2 (two) times daily for 14 days., Disp: 28 tablet,  Rfl: 0 .  docusate sodium (COLACE) 100 MG capsule, Take 100 mg by mouth daily as needed for mild constipation., Disp: , Rfl:  .  DULoxetine (CYMBALTA) 60 MG capsule, Take 60 mg by mouth daily. , Disp: , Rfl: 10 .  furosemide (LASIX) 20 MG tablet, Take 20 mg by mouth daily. , Disp: , Rfl:  .  gabapentin (NEURONTIN) 300 MG capsule, Take 943m in the morning and at noon, then take 12051mat bedtime, Disp: 300 capsule, Rfl: 11 .  glimepiride (AMARYL) 4 MG tablet, Take 4 mg by mouth daily., Disp: , Rfl:  .  glycopyrrolate (ROBINUL) 1 MG tablet, Take 1 mg by mouth 2 (two) times daily. , Disp: , Rfl: 2 .  hydrOXYzine (ATARAX/VISTARIL) 25 MG tablet, TAKE 1 TABLET BY MOUTH 4OTIMES DAILY AS NEEDED FORUANXIETY. MAY ADD TO BUSPIRONE AND XANAX DURING TAPER., Disp: , Rfl:  .  lidocaine (XYLOCAINE) 5 % ointment, APPLY TO AFFECTED AREAS TWICE DAILY AS NEEDED., Disp: 70.88 g, Rfl: 3 .  lisinopril (PRINIVIL,ZESTRIL) 20 MG tablet, Take 20 mg by mouth daily. , Disp: , Rfl: 4 .  omeprazole (PRILOSEC) 20 MG capsule, Take 1 capsule (20 mg total) by mouth 2 (two) times daily before a meal. 1 PO 30 mins prior to breakfast and supper, Disp: 60 capsule, Rfl: 5 .  oxyCODONE HCl  30 MG TABA, Take 30 mg by mouth every 4 (four) hours as needed (for pain). , Disp: , Rfl: 0 .  primidone (MYSOLINE) 50 MG tablet, Take 1 tablet (50 mg total) by mouth 2 (two) times daily., Disp: 60 tablet, Rfl: 11 .  sulfamethoxazole-trimethoprim (BACTRIM DS) 800-160 MG tablet, Take 1 tablet by mouth 2 (two) times daily for 14 days., Disp: 28 tablet, Rfl: 0 .  VOLTAREN 1 % GEL, Apply 2 g topically 4 (four) times daily. , Disp: , Rfl:   Social History   Tobacco Use  Smoking Status Former Smoker  . Packs/day: 2.50  . Years: 25.00  . Pack years: 62.50  . Types: Cigarettes  . Quit date: 08/05/1984  . Years since quitting: 35.0  Smokeless Tobacco Never Used    No Known Allergies Objective:  There were no vitals filed for this visit. There is no  height or weight on file to calculate BMI. Constitutional Well developed. Well nourished.  Vascular Foot warm and well perfused. Capillary refill normal to all digits.   Neurologic Normal speech. Oriented to person, place, and time. Epicritic sensation to light touch grossly present bilaterally.  Dermatologic  partial-thickness dehiscence about the first metatarsal amputation area without warmth erythema or signs of acute infection no bone exposure  Orthopedic:  History of left hallux amputation   Radiographs: No x-rays taken today Assessment:   1. Wound dehiscence    Plan:  Patient was evaluated and treated and all questions answered.  S/p foot surgery left -Since last visit area appears to have worsening dehiscence.  This appears split-thickness in the bone is not exposed.  I do think given his history and noncompliance he would benefit from debridement and repair of the wound dehiscence.  New wet Betadine wet-to-dry dressing applied today.  Patient to leave intact until follow-up.  Requested time for the procedure to be done next week.  No follow-ups on file.

## 2019-09-02 ENCOUNTER — Other Ambulatory Visit: Payer: Self-pay

## 2019-09-02 ENCOUNTER — Telehealth: Payer: Self-pay | Admitting: *Deleted

## 2019-09-02 NOTE — Telephone Encounter (Signed)
-----   Message from Evelina Bucy, DPM sent at 08/31/2019  4:43 PM EDT ----- Can you call Monday and make sure he got his Abx?

## 2019-09-02 NOTE — Telephone Encounter (Signed)
I asked pt if he had picked up the antibiotic 08/31/2019 by [name]. Pt stated he had not, but he would pick up the antibiotic today.

## 2019-09-03 ENCOUNTER — Encounter (HOSPITAL_BASED_OUTPATIENT_CLINIC_OR_DEPARTMENT_OTHER): Payer: Self-pay | Admitting: *Deleted

## 2019-09-03 ENCOUNTER — Other Ambulatory Visit: Payer: Self-pay

## 2019-09-03 ENCOUNTER — Telehealth: Payer: Self-pay | Admitting: *Deleted

## 2019-09-03 ENCOUNTER — Other Ambulatory Visit (HOSPITAL_COMMUNITY)
Admission: RE | Admit: 2019-09-03 | Discharge: 2019-09-03 | Disposition: A | Discharge status: Home / Self Care | Payer: Medicaid Other | Source: Ambulatory Visit | Attending: Podiatry | Admitting: Podiatry

## 2019-09-03 DIAGNOSIS — Z01812 Encounter for preprocedural laboratory examination: Secondary | ICD-10-CM | POA: Diagnosis not present

## 2019-09-03 DIAGNOSIS — Z20828 Contact with and (suspected) exposure to other viral communicable diseases: Secondary | ICD-10-CM | POA: Diagnosis not present

## 2019-09-03 LAB — SARS CORONAVIRUS 2 (TAT 6-24 HRS): SARS Coronavirus 2: NEGATIVE

## 2019-09-03 NOTE — Progress Notes (Signed)
Spoke w/ via phone for pre-op interview--- PT Lab needs dos----   Istat 8            Lab results------  EKG 07-13-2019 chart/ epic COVID test ------ 09-03-2019 Arrive at ------- 0930 NPO after ------ Mn Medications to take morning of surgery ----- Gabapentin, Prilosec, Primidone w/ sips of water Diabetic medication ----- do not take any diabetic medication morning of surgery Patient Special Instructions ----- asked to bring rescue inhaler dos Pre-Op special Istructions ----- received pt's pcp h&p dated 09-02-2019 via fax from dr price office, placed w/ chart.   Patient verbalized understanding of instructions that were given at this phone interview. Patient denies shortness of breath, chest pain, fever, cough a this phone interview.   Anesthesia Review:  HTM, DM2, alcohol abuse (per pt he is no longer drinking liquor daily is down to only 6-7 beers daily at 12 oz)  PCP:   Collene Mares PA ([facility] in [place]) Cardiologist : no Chest x-ray :  EKG : 07-13-2019 epic Echo : 03-11-2014  Epic Stress test:  Nuclear 03-11-2014 epic Cardiac Cath :  no Sleep Study/ CPAP : NO Fasting Blood Sugar :      / Checks Blood Sugar -- times a day:   Blood Thinner/ Instructions /Last Dose: NO ASA / Instructions/ Last Dose :  NO

## 2019-09-03 NOTE — Telephone Encounter (Signed)
Thank you. Glad we checked. He's not picked it up before

## 2019-09-03 NOTE — Telephone Encounter (Signed)
Left message informing pt that I had called him yesterday and instructed him to pick up the antibiotic a the pharmacy and he should do that now and I would inform [name] I had left a message.

## 2019-09-03 NOTE — Telephone Encounter (Signed)
"  I just spoke to Eugene Diaz.  I asked him if he had started the antibiotic that [name] prescribed.  He said he has not.  He said he doesn't have it he said he thought the medicine was going to come by mail order instead of being sent to [facility]  So, I just wanted to make [name] aware.  He can also see my message in Epic if he wants the details of the conversation."  I'll let [name] know.

## 2019-09-04 ENCOUNTER — Ambulatory Visit (HOSPITAL_BASED_OUTPATIENT_CLINIC_OR_DEPARTMENT_OTHER): Payer: Medicaid Other | Admitting: Anesthesiology

## 2019-09-04 ENCOUNTER — Ambulatory Visit (HOSPITAL_BASED_OUTPATIENT_CLINIC_OR_DEPARTMENT_OTHER)
Admission: RE | Admit: 2019-09-04 | Discharge: 2019-09-04 | Disposition: A | Discharge status: Home / Self Care | Payer: Medicaid Other | Attending: Podiatry | Admitting: Podiatry

## 2019-09-04 ENCOUNTER — Ambulatory Visit (HOSPITAL_COMMUNITY): Payer: Medicaid Other

## 2019-09-04 ENCOUNTER — Encounter (HOSPITAL_BASED_OUTPATIENT_CLINIC_OR_DEPARTMENT_OTHER)
Admission: RE | Disposition: A | Discharge status: Home / Self Care | Payer: Self-pay | Source: Home / Self Care | Attending: Podiatry

## 2019-09-04 ENCOUNTER — Encounter (HOSPITAL_BASED_OUTPATIENT_CLINIC_OR_DEPARTMENT_OTHER): Payer: Self-pay

## 2019-09-04 DIAGNOSIS — Z87891 Personal history of nicotine dependence: Secondary | ICD-10-CM | POA: Insufficient documentation

## 2019-09-04 DIAGNOSIS — Z79899 Other long term (current) drug therapy: Secondary | ICD-10-CM | POA: Diagnosis not present

## 2019-09-04 DIAGNOSIS — F329 Major depressive disorder, single episode, unspecified: Secondary | ICD-10-CM | POA: Diagnosis not present

## 2019-09-04 DIAGNOSIS — M1990 Unspecified osteoarthritis, unspecified site: Secondary | ICD-10-CM | POA: Insufficient documentation

## 2019-09-04 DIAGNOSIS — J45909 Unspecified asthma, uncomplicated: Secondary | ICD-10-CM | POA: Insufficient documentation

## 2019-09-04 DIAGNOSIS — B965 Pseudomonas (aeruginosa) (mallei) (pseudomallei) as the cause of diseases classified elsewhere: Secondary | ICD-10-CM | POA: Diagnosis not present

## 2019-09-04 DIAGNOSIS — T8130XA Disruption of wound, unspecified, initial encounter: Secondary | ICD-10-CM

## 2019-09-04 DIAGNOSIS — E1151 Type 2 diabetes mellitus with diabetic peripheral angiopathy without gangrene: Secondary | ICD-10-CM | POA: Insufficient documentation

## 2019-09-04 DIAGNOSIS — E1140 Type 2 diabetes mellitus with diabetic neuropathy, unspecified: Secondary | ICD-10-CM | POA: Insufficient documentation

## 2019-09-04 DIAGNOSIS — Z9889 Other specified postprocedural states: Secondary | ICD-10-CM

## 2019-09-04 DIAGNOSIS — X58XXXA Exposure to other specified factors, initial encounter: Secondary | ICD-10-CM | POA: Insufficient documentation

## 2019-09-04 DIAGNOSIS — I10 Essential (primary) hypertension: Secondary | ICD-10-CM | POA: Diagnosis not present

## 2019-09-04 DIAGNOSIS — K219 Gastro-esophageal reflux disease without esophagitis: Secondary | ICD-10-CM | POA: Insufficient documentation

## 2019-09-04 DIAGNOSIS — Z7984 Long term (current) use of oral hypoglycemic drugs: Secondary | ICD-10-CM | POA: Insufficient documentation

## 2019-09-04 DIAGNOSIS — T8781 Dehiscence of amputation stump: Secondary | ICD-10-CM | POA: Insufficient documentation

## 2019-09-04 DIAGNOSIS — M86172 Other acute osteomyelitis, left ankle and foot: Secondary | ICD-10-CM | POA: Diagnosis not present

## 2019-09-04 DIAGNOSIS — Z9119 Patient's noncompliance with other medical treatment and regimen: Secondary | ICD-10-CM | POA: Insufficient documentation

## 2019-09-04 DIAGNOSIS — F419 Anxiety disorder, unspecified: Secondary | ICD-10-CM | POA: Diagnosis not present

## 2019-09-04 DIAGNOSIS — Z7951 Long term (current) use of inhaled steroids: Secondary | ICD-10-CM | POA: Insufficient documentation

## 2019-09-04 DIAGNOSIS — E7800 Pure hypercholesterolemia, unspecified: Secondary | ICD-10-CM | POA: Insufficient documentation

## 2019-09-04 HISTORY — DX: Alcohol abuse, uncomplicated: F10.10

## 2019-09-04 HISTORY — DX: Thrombocytopenia, unspecified: D69.6

## 2019-09-04 HISTORY — DX: Alcoholic polyneuropathy: G62.1

## 2019-09-04 HISTORY — DX: Type 2 diabetes mellitus with diabetic neuropathy, unspecified: E11.40

## 2019-09-04 HISTORY — DX: Essential tremor: G25.0

## 2019-09-04 HISTORY — PX: WOUND DEBRIDEMENT: SHX247

## 2019-09-04 HISTORY — DX: Personal history of other diseases of the digestive system: Z87.19

## 2019-09-04 HISTORY — DX: Gastro-esophageal reflux disease without esophagitis: K21.9

## 2019-09-04 HISTORY — DX: Deficiency of other specified B group vitamins: E53.8

## 2019-09-04 HISTORY — DX: Type 2 diabetes mellitus without complications: E11.9

## 2019-09-04 LAB — GLUCOSE, CAPILLARY: Glucose-Capillary: 125 mg/dL — ABNORMAL HIGH (ref 70–99)

## 2019-09-04 LAB — POCT I-STAT, CHEM 8
BUN: 23 mg/dL — ABNORMAL HIGH (ref 6–20)
Calcium, Ion: 1.32 mmol/L (ref 1.15–1.40)
Chloride: 104 mmol/L (ref 98–111)
Creatinine, Ser: 1.1 mg/dL (ref 0.61–1.24)
Glucose, Bld: 140 mg/dL — ABNORMAL HIGH (ref 70–99)
HCT: 32 % — ABNORMAL LOW (ref 39.0–52.0)
Hemoglobin: 10.9 g/dL — ABNORMAL LOW (ref 13.0–17.0)
Potassium: 5 mmol/L (ref 3.5–5.1)
Sodium: 139 mmol/L (ref 135–145)
TCO2: 23 mmol/L (ref 22–32)

## 2019-09-04 SURGERY — DEBRIDEMENT, WOUND
Anesthesia: General | Site: Foot | Laterality: Left

## 2019-09-04 MED ORDER — ACETAMINOPHEN 500 MG PO TABS
1000.0000 mg | ORAL_TABLET | Freq: Once | ORAL | Status: AC
  Administered 2019-09-04: 1000 mg via ORAL
  Filled 2019-09-04: qty 2

## 2019-09-04 MED ORDER — LIDOCAINE HCL (CARDIAC) PF 100 MG/5ML IV SOSY
PREFILLED_SYRINGE | INTRAVENOUS | Status: DC | PRN
  Administered 2019-09-04: 60 mg via INTRAVENOUS

## 2019-09-04 MED ORDER — FENTANYL CITRATE (PF) 100 MCG/2ML IJ SOLN
INTRAMUSCULAR | Status: AC
  Filled 2019-09-04: qty 2

## 2019-09-04 MED ORDER — ONDANSETRON HCL 4 MG/2ML IJ SOLN
4.0000 mg | Freq: Once | INTRAMUSCULAR | Status: DC | PRN
  Filled 2019-09-04: qty 2

## 2019-09-04 MED ORDER — ONDANSETRON HCL 4 MG/2ML IJ SOLN
INTRAMUSCULAR | Status: AC
  Filled 2019-09-04: qty 2

## 2019-09-04 MED ORDER — MEPERIDINE HCL 25 MG/ML IJ SOLN
6.2500 mg | INTRAMUSCULAR | Status: DC | PRN
  Filled 2019-09-04: qty 1

## 2019-09-04 MED ORDER — LIDOCAINE 2% (20 MG/ML) 5 ML SYRINGE
INTRAMUSCULAR | Status: AC
  Filled 2019-09-04: qty 5

## 2019-09-04 MED ORDER — FENTANYL CITRATE (PF) 100 MCG/2ML IJ SOLN
INTRAMUSCULAR | Status: DC | PRN
  Administered 2019-09-04 (×4): 25 ug via INTRAVENOUS

## 2019-09-04 MED ORDER — ONDANSETRON HCL 4 MG/2ML IJ SOLN
INTRAMUSCULAR | Status: DC | PRN
  Administered 2019-09-04: 4 mg via INTRAVENOUS

## 2019-09-04 MED ORDER — HYDROMORPHONE HCL 1 MG/ML IJ SOLN
0.2500 mg | INTRAMUSCULAR | Status: DC | PRN
  Filled 2019-09-04: qty 0.5

## 2019-09-04 MED ORDER — BUPIVACAINE HCL (PF) 0.5 % IJ SOLN
INTRAMUSCULAR | Status: DC | PRN
  Administered 2019-09-04: 10 mL

## 2019-09-04 MED ORDER — MIDAZOLAM HCL 2 MG/2ML IJ SOLN
INTRAMUSCULAR | Status: AC
  Filled 2019-09-04: qty 2

## 2019-09-04 MED ORDER — CEFAZOLIN SODIUM-DEXTROSE 2-4 GM/100ML-% IV SOLN
2.0000 g | INTRAVENOUS | Status: AC
  Administered 2019-09-04: 2 g via INTRAVENOUS
  Filled 2019-09-04: qty 100

## 2019-09-04 MED ORDER — MIDAZOLAM HCL 5 MG/5ML IJ SOLN
INTRAMUSCULAR | Status: DC | PRN
  Administered 2019-09-04: 2 mg via INTRAVENOUS

## 2019-09-04 MED ORDER — LACTATED RINGERS IV SOLN
INTRAVENOUS | Status: DC
  Administered 2019-09-04: 10:00:00 via INTRAVENOUS
  Filled 2019-09-04: qty 1000

## 2019-09-04 MED ORDER — PROPOFOL 500 MG/50ML IV EMUL
INTRAVENOUS | Status: AC
  Filled 2019-09-04: qty 50

## 2019-09-04 MED ORDER — PROPOFOL 10 MG/ML IV BOLUS
INTRAVENOUS | Status: AC
  Filled 2019-09-04: qty 40

## 2019-09-04 MED ORDER — ACETAMINOPHEN 500 MG PO TABS
ORAL_TABLET | ORAL | Status: AC
  Filled 2019-09-04: qty 2

## 2019-09-04 MED ORDER — SULFAMETHOXAZOLE-TRIMETHOPRIM 800-160 MG PO TABS: 1.0000 | ORAL_TABLET | Freq: Two times a day (BID) | ORAL | 0 refills | Status: AC

## 2019-09-04 MED ORDER — PROPOFOL 500 MG/50ML IV EMUL
INTRAVENOUS | Status: DC | PRN
  Administered 2019-09-04: 50 ug/kg/min via INTRAVENOUS

## 2019-09-04 MED ORDER — CEFAZOLIN SODIUM-DEXTROSE 2-4 GM/100ML-% IV SOLN
INTRAVENOUS | Status: AC
  Filled 2019-09-04: qty 100

## 2019-09-04 MED ORDER — VANCOMYCIN HCL 1 G IV SOLR
INTRAVENOUS | Status: DC | PRN
  Administered 2019-09-04: 1000 mg

## 2019-09-04 SURGICAL SUPPLY — 56 items
BANDAGE ACE 4X5 VEL STRL LF (GAUZE/BANDAGES/DRESSINGS) ×2 IMPLANT
BLADE SURG 15 STRL LF DISP TIS (BLADE) ×1 IMPLANT
BLADE SURG 15 STRL SS (BLADE) ×2
BNDG ELASTIC 4X5.8 VLCR STR LF (GAUZE/BANDAGES/DRESSINGS) ×1 IMPLANT
BNDG GAUZE ELAST 4 BULKY (GAUZE/BANDAGES/DRESSINGS) ×2 IMPLANT
COVER BACK TABLE 60X90IN (DRAPES) ×2 IMPLANT
COVER WAND RF STERILE (DRAPES) ×3 IMPLANT
CUFF TOURN SGL QUICK 18X4 (TOURNIQUET CUFF) ×1 IMPLANT
DRAPE 3/4 80X56 (DRAPES) ×2 IMPLANT
DRAPE EXTREMITY T 121X128X90 (DISPOSABLE) ×2 IMPLANT
DRAPE U-SHAPE 47X51 STRL (DRAPES) ×2 IMPLANT
ELECT REM PT RETURN 9FT ADLT (ELECTROSURGICAL) ×2
ELECTRODE REM PT RTRN 9FT ADLT (ELECTROSURGICAL) ×1 IMPLANT
GAUZE SPONGE 4X4 12PLY STRL (GAUZE/BANDAGES/DRESSINGS) ×2 IMPLANT
GAUZE XEROFORM 1X8 LF (GAUZE/BANDAGES/DRESSINGS) ×2 IMPLANT
GLOVE BIO SURGEON STRL SZ 6.5 (GLOVE) ×1 IMPLANT
GLOVE BIO SURGEON STRL SZ7.5 (GLOVE) ×2 IMPLANT
GLOVE BIOGEL PI IND STRL 6.5 (GLOVE) IMPLANT
GLOVE BIOGEL PI IND STRL 7.0 (GLOVE) IMPLANT
GLOVE BIOGEL PI IND STRL 8 (GLOVE) ×1 IMPLANT
GLOVE BIOGEL PI INDICATOR 6.5 (GLOVE) ×1
GLOVE BIOGEL PI INDICATOR 7.0 (GLOVE) ×1
GLOVE BIOGEL PI INDICATOR 8 (GLOVE) ×1
GLOVE SURG SS PI 7.5 STRL IVOR (GLOVE) ×1 IMPLANT
GOWN STRL REUS W/ TWL LRG LVL3 (GOWN DISPOSABLE) IMPLANT
GOWN STRL REUS W/ TWL XL LVL3 (GOWN DISPOSABLE) ×1 IMPLANT
GOWN STRL REUS W/TWL LRG LVL3 (GOWN DISPOSABLE) ×2
GOWN STRL REUS W/TWL XL LVL3 (GOWN DISPOSABLE) ×2
IV NS 1000ML (IV SOLUTION) ×2
IV NS 1000ML BAXH (IV SOLUTION) IMPLANT
MANIFOLD NEPTUNE II (INSTRUMENTS) ×2 IMPLANT
NDL HYPO 25X1 1.5 SAFETY (NEEDLE) ×1 IMPLANT
NEEDLE HYPO 25X1 1.5 SAFETY (NEEDLE) ×2 IMPLANT
NS IRRIG 1000ML POUR BTL (IV SOLUTION) IMPLANT
PACK BASIN DAY SURGERY FS (CUSTOM PROCEDURE TRAY) ×2 IMPLANT
PAD CAST 4YDX4 CTTN HI CHSV (CAST SUPPLIES) IMPLANT
PADDING CAST ABS 4INX4YD NS (CAST SUPPLIES) ×1
PADDING CAST ABS COTTON 4X4 ST (CAST SUPPLIES) ×1 IMPLANT
PADDING CAST COTTON 4X4 STRL (CAST SUPPLIES) ×2
PENCIL BUTTON HOLSTER BLD 10FT (ELECTRODE) IMPLANT
PROBE DEBRIDE SONICVAC MISONIX (TIP) ×1 IMPLANT
STAPLER VISISTAT 35W (STAPLE) ×1 IMPLANT
STOCKINETTE 6  STRL (DRAPES) ×1
STOCKINETTE 6 STRL (DRAPES) ×1 IMPLANT
SUCTION FRAZIER HANDLE 10FR (MISCELLANEOUS) ×1
SUCTION TUBE FRAZIER 10FR DISP (MISCELLANEOUS) IMPLANT
SUT ETHILON 3 0 PS 1 (SUTURE) ×2 IMPLANT
SUT VIC AB 2-0 SH 27 (SUTURE) ×2
SUT VIC AB 2-0 SH 27XBRD (SUTURE) IMPLANT
SWAB COLLECTION DEVICE MRSA (MISCELLANEOUS) ×1 IMPLANT
SWAB CULTURE ESWAB REG 1ML (MISCELLANEOUS) ×1 IMPLANT
SYR BULB 3OZ (MISCELLANEOUS) ×2 IMPLANT
SYR CONTROL 10ML LL (SYRINGE) ×2 IMPLANT
TRAY DSU PREP LF (CUSTOM PROCEDURE TRAY) ×2 IMPLANT
TUBE IRRIGATION SET MISONIX (TUBING) ×1 IMPLANT
UNDERPAD 30X30 (UNDERPADS AND DIAPERS) ×2 IMPLANT

## 2019-09-04 NOTE — Discharge Instructions (Signed)
°  Post Anesthesia Home Care Instructions  Activity: Get plenty of rest for the remainder of the day. A responsible individual must stay with you for 24 hours following the procedure.  For the next 24 hours, DO NOT: -Drive a car -Paediatric nurse -Drink alcoholic beverages -Take any medication unless instructed by your physician -Make any legal decisions or sign important papers.  Meals: Start with liquid foods such as gelatin or soup. Progress to regular foods as tolerated. Avoid greasy, spicy, heavy foods. If nausea and/or vomiting occur, drink only clear liquids until the nausea and/or vomiting subsides. Call your physician if vomiting continues.  Special Instructions/Symptoms: Your throat may feel dry or sore from the anesthesia or the breathing tube placed in your throat during surgery. If this causes discomfort, gargle with warm salt water. The discomfort should disappear within 24 hours.  Call your surgeon if you experience:   1.  Fever over 101.0. 2.  Inability to urinate. 3.  Nausea and/or vomiting. 4.  Extreme swelling or bruising at the surgical site. 5.  Continued bleeding from the incision. 6.  Increased pain, redness or drainage from the incision. 7.  Problems related to your pain medication. 8.  Any problems and/or concerns. 9. Changes in color or sensation of operative foot/leg.

## 2019-09-04 NOTE — Transfer of Care (Signed)
Immediate Anesthesia Transfer of Care Note  Patient: Eugene Diaz  Procedure(s) Performed: Procedure(s) (LRB): DEBRIDEMENT WOUND WITH COMPLEX  REPAIR OF DEHISCENCE (Left)  Patient Location: PACU  Anesthesia Type: General  Level of Consciousness: awake, sedated, patient cooperative and responds to stimulation  Airway & Oxygen Therapy: Patient Spontanous Breathing and Patient connected to face mask oxygen  Post-op Assessment: Report given to PACU RN, Post -op Vital signs reviewed and stable and Patient moving all extremities  Post vital signs: Reviewed and stable  Complications: No apparent anesthesia complications

## 2019-09-04 NOTE — Op Note (Signed)
Patient Name: Eugene Diaz DOB: 13-May-1966  MRN: [id number]   Date of Service: 09/04/2019  Surgeon: Dr. Hardie Pulley, DPM Assistants: None Pre-operative Diagnosis: Wound dehiscence left foot Post-operative Diagnosis: same Procedures:             1) Complex repair of wound dehiscence, left foot Pathology/Specimens: ID Type Source Tests Collected by Time Destination  A : deep swab left foot Tissue PATH Soft tissue FUNGUS CULTURE WITH STAIN, AEROBIC/ANAEROBIC CULTURE (SURGICAL/DEEP WOUND) Evelina Bucy, DPM 09/04/2019 1222    Anesthesia: MAC/local Hemostasis: Anatomic Estimated Blood Loss: 50 ml Materials: None Medications: 1 g Vancomycin powder Complications: None  Indications for Procedure:  This is a 53 y.o. male who previously underwent a left great toe amputation. He experienced a wound dehiscence post-operatively secondary to non-compliance. It was discussed that he would benefit from repair of the dehiscence given concern of developing further infection and risk of further amputation. All risks, benefits, and alternatives discussed. No guarantees given.   Procedure in Detail: Patient was identified in pre-operative holding area. Formal consent was signed and the left lower extremity was marked. Patient was brought back to the operating room and placed on the operating room table in the supine position. Anesthesia was induced.   The extremity was prepped and draped in the usual sterile fashion. Timeout was taken to confirm patient name, laterality, and procedure prior to incision. Attention was then directed to the left 1st toe amputation site where a wound measuring 3.5x2 was encountered. A deep swab culture was taken for microbiology.  The wound was sharply excisionally debrided with a 15 blade, followed by a misonix ultrasonic debrider. Debridement was performed to bleeding, viable wound base. The wound was debrided to the level of the bone tissue but the bone was not debrided.  Following debridement the wound measured 5x2.1.5.  The wound had exposed bone but the bone did appear healthy and viable. A periosteal flap was created and the bone was covered with 3-0 Vicryl. Continued complex layered closure was performed. The skin edges were freshened to remove friable tissue. The skin edges were undermined. The subcutaneous flap that was created was closed with 3-0 Vicryl. The skin was closed with 3-0 Nylon retention sutures, followed by skin staples. The skin edges came together nicely without excessive tension.  The foot was then dressed with xeroform, 4x4, kerlix, and ACE bandage. Patient tolerated the procedure well.   Disposition: Following a period of post-operative monitoring, patient will be transferred back home.

## 2019-09-04 NOTE — Anesthesia Preprocedure Evaluation (Addendum)
Anesthesia Evaluation  Patient identified by MRN, date of birth, ID band Patient awake    Reviewed: Allergy & Precautions, NPO status , Patient's Chart, lab work & pertinent test results  Airway Mallampati: I  TM Distance: >3 FB Neck ROM: Full    Dental   Pulmonary asthma , former smoker,    Pulmonary exam normal        Cardiovascular hypertension, Pt. on medications Normal cardiovascular exam     Neuro/Psych Anxiety Depression    GI/Hepatic GERD  ,  Endo/Other  diabetes, Type 2  Renal/GU      Musculoskeletal   Abdominal   Peds  Hematology   Anesthesia Other Findings   Reproductive/Obstetrics                             Anesthesia Physical Anesthesia Plan  ASA: II  Anesthesia Plan: MAC   Post-op Pain Management:    Induction: Intravenous  PONV Risk Score and Plan: 2 and Ondansetron and Midazolam  Airway Management Planned: Simple Face Mask  Additional Equipment:   Intra-op Plan:   Post-operative Plan: Extubation in OR  Informed Consent: I have reviewed the patients History and Physical, chart, labs and discussed the procedure including the risks, benefits and alternatives for the proposed anesthesia with the patient or authorized representative who has indicated his/her understanding and acceptance.       Plan Discussed with: CRNA and Surgeon  Anesthesia Plan Comments:        Anesthesia Quick Evaluation

## 2019-09-04 NOTE — Interval H&P Note (Signed)
History and Physical Interval Note:  09/04/2019 11:24 AM  Eugene Diaz  has presented today for surgery, with the diagnosis of Wound Dehiscence.  The various methods of treatment have been discussed with the patient and family. After consideration of risks, benefits and other options for treatment, the patient has consented to  Procedure(s) with comments: [place] (Left) - Leave patient on stretcher as a surgical intervention.  The patient's history has been reviewed, patient examined, no change in status, stable for surgery.  I have reviewed the patient's chart and labs.  Questions were answered to the patient's satisfaction.     Evelina Bucy

## 2019-09-04 NOTE — Anesthesia Procedure Notes (Signed)
Procedure Name: MAC Date/Time: 09/04/2019 12:02 PM Performed by: Justice Rocher, CRNA Pre-anesthesia Checklist: Patient identified, Emergency Drugs available, Suction available, Patient being monitored and Timeout performed Patient Re-evaluated:Patient Re-evaluated prior to induction Oxygen Delivery Method: Simple face mask Preoxygenation: Pre-oxygenation with 100% oxygen Induction Type: IV induction Placement Confirmation: breath sounds checked- equal and bilateral and positive ETCO2

## 2019-09-05 ENCOUNTER — Telehealth: Payer: Self-pay

## 2019-09-05 ENCOUNTER — Encounter (HOSPITAL_BASED_OUTPATIENT_CLINIC_OR_DEPARTMENT_OTHER): Payer: Self-pay | Admitting: Podiatry

## 2019-09-05 LAB — FUNGUS CULTURE WITH STAIN

## 2019-09-05 NOTE — Telephone Encounter (Signed)
POST OP CALL-    1) General condition stated by the patient:   2) Is the pt having pain?   3) Pain score:   4) Has the pt taken Rx'd pain medication, regularly or PRN?   5) Is the pain medication giving relief?  6) Any fever, chills, nausea, or vomiting, shortness of breath or tightness in calf?  7) Is the bandage clean, dry and intact?  8) Is there excessive tightness, bleeding or drainage coming through the bandage?  9) Did you understand all of the post op instruction sheet given?  10) Any questions or concerns regarding post op care/recovery?    Confirmed POV appointment with patient     Pt did not answer phone. Left voice message stating if he had any concerns or problems to call [facility] immediately and we would advise, otherwise we would see him at his next scheduled post op all.

## 2019-09-05 NOTE — Anesthesia Postprocedure Evaluation (Signed)
Anesthesia Post Note  Patient: Eugene Diaz  Procedure(s) Performed: DEBRIDEMENT WOUND WITH COMPLEX  REPAIR OF DEHISCENCE (Left Foot)     Patient location during evaluation: PACU Anesthesia Type: General Level of consciousness: awake and alert Pain management: pain level controlled Vital Signs Assessment: post-procedure vital signs reviewed and stable Respiratory status: spontaneous breathing, nonlabored ventilation, respiratory function stable and patient connected to nasal cannula oxygen Cardiovascular status: stable and blood pressure returned to baseline Postop Assessment: no apparent nausea or vomiting Anesthetic complications: no    Last Vitals:  Vitals:   09/04/19 1315 09/04/19 1333  BP: (!) 159/84 (!) 166/87  Pulse: 73 73  Resp: 18 18  Temp:  36.8 C  SpO2: 99% 99%    Last Pain:  Vitals:   09/04/19 1410  TempSrc:   PainSc: 0-No pain                 Chozen Latulippe DAVID

## 2019-09-09 LAB — AEROBIC/ANAEROBIC CULTURE W GRAM STAIN (SURGICAL/DEEP WOUND)

## 2019-09-12 ENCOUNTER — Ambulatory Visit (INDEPENDENT_AMBULATORY_CARE_PROVIDER_SITE_OTHER): Payer: Medicaid Other | Admitting: Podiatry

## 2019-09-12 ENCOUNTER — Other Ambulatory Visit: Payer: Self-pay

## 2019-09-12 DIAGNOSIS — L03032 Cellulitis of left toe: Secondary | ICD-10-CM

## 2019-09-12 DIAGNOSIS — L02612 Cutaneous abscess of left foot: Secondary | ICD-10-CM

## 2019-09-12 DIAGNOSIS — Z9889 Other specified postprocedural states: Secondary | ICD-10-CM

## 2019-09-12 MED ORDER — CIPROFLOXACIN HCL 500 MG PO TABS: 500.0000 mg | ORAL_TABLET | Freq: Two times a day (BID) | ORAL | 0 refills | Status: DC

## 2019-09-12 NOTE — Progress Notes (Signed)
Subjective:  Patient ID: Eugene Diaz, male    DOB: 1965/12/18,  MRN: [id number]  Chief Complaint  Patient presents with   Routine Post Op    DOS 09/04/2019 SECONDARY/DELAYED CLOSURE LT. Pt states wound seems moist. Denies fever/nausea/vomiting/chills. Pt had no other concerns at this time.    DOS: 07/16/2019 Procedure: Open reduction, perc pinning left hallux fracture, incision and drainage left hallux.  DOS: 08/14/2019 Procedure: amoutaton left hallux  DOS: 09/04/2019 Procedure: Delayed wound closure - repair of complex dehiscence  53 y.o. male returns for post-op check.Doing well denies complaints or constitutional signs of infection. Dressing c/d/i.  Review of Systems: Negative except as noted in the HPI. Denies N/V/F/Ch.  Past Medical History:  Diagnosis Date   Alcohol abuse    Alcoholic peripheral neuropathy (HCC)    Anemia    Anxiety    Arthritis    Asthma    followed by pcp   B12 deficiency    Cellulitis of left lower leg    07-2019 and left foot   Depression    Diabetic neuropathy [facility])    Essential tremor    neurologist-- dr patel--- due to alcohol abuse   GERD (gastroesophageal reflux disease)    History of esophageal stricture    s/p  dilatation 02-13-2018   Hypercholesterolemia    Hypertension    followed by pcp  (nuclear stress test 03-11-2014 low risk w/ no ischemia, ef 65%   Osteomyelitis of great toe (HCC)    left foot s/p amputation 08-14-2019   Peripheral vascular disease ([place])    Thrombocytopenia (HCC)    chronic   Type 2 diabetes mellitus ([place])    followed by pcp--- last A1c in epic 6.6 on 07-12-2019   Wound dehiscence, surgical    post left great toe amputation 08-14-2019    Current Outpatient Medications:    ACCU-CHEK GUIDE test strip, USE TO TEST BLOOD SUGARTDAILY AS DIRECTED., Disp: , Rfl:    Accu-Chek Softclix Lancets lancets, USE TO TEST BLOOD SUGARADAILY AS DIRECTED., Disp: , Rfl:    albuterol (PROVENTIL  HFA;VENTOLIN HFA) 108 (90 Base) MCG/ACT inhaler, Inhale 2 puffs into the lungs every 6 (six) hours as needed for wheezing or shortness of breath., Disp: 1 Inhaler, Rfl: 2   amitriptyline (ELAVIL) 100 MG tablet, Take 100 mg by mouth at bedtime., Disp: , Rfl:    atorvastatin (LIPITOR) 40 MG tablet, Take 40 mg by mouth every evening. , Disp: , Rfl: 10   Blood Glucose Monitoring Suppl (ACCU-CHEK GUIDE) w/Device KIT, USE TO TEST BLOOD SUGARAAS DIRECTED BY DOCTOR., Disp: , Rfl:    canagliflozin (INVOKANA) 300 MG TABS tablet, 300 mg daily before breakfast. , Disp: , Rfl:    docusate sodium (COLACE) 100 MG capsule, Take 100 mg by mouth daily as needed for mild constipation., Disp: , Rfl:    DULoxetine (CYMBALTA) 60 MG capsule, Take 60 mg by mouth daily. , Disp: , Rfl: 10   furosemide (LASIX) 20 MG tablet, Take 20 mg by mouth daily. , Disp: , Rfl:    gabapentin (NEURONTIN) 300 MG capsule, Take 975m in the morning and at noon, then take 12084mat bedtime (Patient taking differently: Take 90032mn the morning and at noon, then take 1200m38m bedtime), Disp: 300 capsule, Rfl: 11   glimepiride (AMARYL) 4 MG tablet, Take 4 mg by mouth daily., Disp: , Rfl:    glycopyrrolate (ROBINUL) 1 MG tablet, Take 1 mg by mouth 2 (two) times daily. , Disp: ,  Rfl: 2   hydrOXYzine (ATARAX/VISTARIL) 25 MG tablet, TAKE 1 TABLET BY MOUTH 4OTIMES DAILY AS NEEDED FORUANXIETY. MAY ADD TO BUSPIRONE AND XANAX DURING TAPER., Disp: , Rfl:    lidocaine (XYLOCAINE) 5 % ointment, APPLY TO AFFECTED AREAS TWICE DAILY AS NEEDED., Disp: 70.88 g, Rfl: 3   lisinopril (PRINIVIL,ZESTRIL) 20 MG tablet, Take 20 mg by mouth daily. , Disp: , Rfl: 4   omeprazole (PRILOSEC) 20 MG capsule, Take 1 capsule (20 mg total) by mouth 2 (two) times daily before a meal. 1 PO 30 mins prior to breakfast and supper, Disp: 60 capsule, Rfl: 5   oxyCODONE HCl 30 MG TABA, Take 30 mg by mouth every 4 (four) hours as needed (for pain). , Disp: , Rfl: 0    primidone (MYSOLINE) 50 MG tablet, Take 1 tablet (50 mg total) by mouth 2 (two) times daily. (Patient taking differently: Take 50 mg by mouth 2 (two) times daily. ), Disp: 60 tablet, Rfl: 11   sulfamethoxazole-trimethoprim (BACTRIM DS) 800-160 MG tablet, Take 1 tablet by mouth 2 (two) times daily for 14 days., Disp: 28 tablet, Rfl: 0   VOLTAREN 1 % GEL, Apply 2 g topically 4 (four) times daily. , Disp: , Rfl:    ciprofloxacin (CIPRO) 500 MG tablet, Take 1 tablet (500 mg total) by mouth 2 (two) times daily., Disp: 14 tablet, Rfl: 0  Social History   Tobacco Use  Smoking Status Former Smoker   Packs/day: 2.50   Years: 25.00   Pack years: 62.50   Types: Cigarettes   Quit date: 08/05/1984   Years since quitting: 35.1  Smokeless Tobacco Never Used    No Known Allergies Objective:  There were no vitals filed for this visit. There is no height or weight on file to calculate BMI. Constitutional Well developed. Well nourished.  Vascular Foot warm and well perfused. Capillary refill normal to all digits.   Neurologic Normal speech. Oriented to person, place, and time. Epicritic sensation to light touch grossly present bilaterally.  Dermatologic Wound edges well coapted with intact suture. Slight maceration along incision. No purulence. Slight periwound erythema noted.  Orthopedic:  History of left hallux amputation   Radiographs: No x-rays taken today Assessment:   1. Post-operative state   2. Cellulitis and abscess of toe of left foot    Plan:  Patient was evaluated and treated and all questions answered.  S/p foot surgery left -Wound dressed with betadine and DSD. -Discussed dressing changes every other day with betadine WTD. -Rx cipro given culture results and slight cellulitis today.  Return in about 1 week (around 09/19/2019) for Post-Op (No XRs), Wound Care.

## 2019-09-19 ENCOUNTER — Ambulatory Visit (INDEPENDENT_AMBULATORY_CARE_PROVIDER_SITE_OTHER): Payer: Medicaid Other | Admitting: Podiatry

## 2019-09-19 ENCOUNTER — Other Ambulatory Visit: Payer: Self-pay

## 2019-09-19 DIAGNOSIS — L03032 Cellulitis of left toe: Secondary | ICD-10-CM | POA: Diagnosis not present

## 2019-09-19 DIAGNOSIS — R600 Localized edema: Secondary | ICD-10-CM

## 2019-09-19 DIAGNOSIS — Z9889 Other specified postprocedural states: Secondary | ICD-10-CM

## 2019-09-19 MED ORDER — CIPROFLOXACIN HCL 500 MG PO TABS: 500.0000 mg | ORAL_TABLET | Freq: Two times a day (BID) | ORAL | 0 refills | Status: DC

## 2019-09-19 NOTE — Progress Notes (Signed)
Subjective:  Patient ID: Eugene Diaz, male    DOB: [birth year],  MRN: [id number]  Chief Complaint  Patient presents with   Follow-up    left foot , post op - wound check , dressing change    DOS: 07/16/2019 Procedure: Open reduction, perc pinning left hallux fracture, incision and drainage left hallux.  DOS: 08/14/2019 Procedure: amoutaton left hallux  DOS: 09/04/2019 Procedure: Delayed wound closure - repair of complex dehiscence  53 y.o. male returns for post-op check Has been taking his abx once daily only at night. Hs gotten his dressing wet. Has been walking a lot on his foot despite restrictions.  Review of Systems: Negative except as noted in the HPI. Denies N/V/F/Ch.  Past Medical History:  Diagnosis Date   Alcohol abuse    Alcoholic peripheral neuropathy (HCC)    Anemia    Anxiety    Arthritis    Asthma    followed by pcp   B12 deficiency    Cellulitis of left lower leg    07-2019 and left foot   Depression    Diabetic neuropathy [facility])    Essential tremor    neurologist-- dr patel--- due to alcohol abuse   GERD (gastroesophageal reflux disease)    History of esophageal stricture    s/p  dilatation 02-13-2018   Hypercholesterolemia    Hypertension    followed by pcp  (nuclear stress test 03-11-2014 low risk w/ no ischemia, ef 65%   Osteomyelitis of great toe (HCC)    left foot s/p amputation 08-14-2019   Peripheral vascular disease ([place])    Thrombocytopenia (HCC)    chronic   Type 2 diabetes mellitus ([place])    followed by pcp--- last A1c in epic 6.6 on 07-12-2019   Wound dehiscence, surgical    post left great toe amputation 08-14-2019    Current Outpatient Medications:    ACCU-CHEK GUIDE test strip, USE TO TEST BLOOD SUGARTDAILY AS DIRECTED., Disp: , Rfl:    Accu-Chek Softclix Lancets lancets, USE TO TEST BLOOD SUGARADAILY AS DIRECTED., Disp: , Rfl:    albuterol (PROVENTIL HFA;VENTOLIN HFA) 108 (90 Base) MCG/ACT inhaler,  Inhale 2 puffs into the lungs every 6 (six) hours as needed for wheezing or shortness of breath., Disp: 1 Inhaler, Rfl: 2   amitriptyline (ELAVIL) 100 MG tablet, Take 100 mg by mouth at bedtime., Disp: , Rfl:    atorvastatin (LIPITOR) 40 MG tablet, Take 40 mg by mouth every evening. , Disp: , Rfl: 10   Blood Glucose Monitoring Suppl (ACCU-CHEK GUIDE) w/Device KIT, USE TO TEST BLOOD SUGARAAS DIRECTED BY DOCTOR., Disp: , Rfl:    canagliflozin (INVOKANA) 300 MG TABS tablet, 300 mg daily before breakfast. , Disp: , Rfl:    ciprofloxacin (CIPRO) 500 MG tablet, Take 1 tablet (500 mg total) by mouth 2 (two) times daily., Disp: 14 tablet, Rfl: 0   docusate sodium (COLACE) 100 MG capsule, Take 100 mg by mouth daily as needed for mild constipation., Disp: , Rfl:    DULoxetine (CYMBALTA) 60 MG capsule, Take 60 mg by mouth daily. , Disp: , Rfl: 10   furosemide (LASIX) 20 MG tablet, Take 20 mg by mouth daily. , Disp: , Rfl:    gabapentin (NEURONTIN) 300 MG capsule, Take 912m in the morning and at noon, then take 12027mat bedtime (Patient taking differently: Take 90042mn the morning and at noon, then take 1200m50m bedtime), Disp: 300 capsule, Rfl: 11   glimepiride (AMARYL) 4 MG tablet, Take  4 mg by mouth daily., Disp: , Rfl:    glycopyrrolate (ROBINUL) 1 MG tablet, Take 1 mg by mouth 2 (two) times daily. , Disp: , Rfl: 2   hydrOXYzine (ATARAX/VISTARIL) 25 MG tablet, TAKE 1 TABLET BY MOUTH 4OTIMES DAILY AS NEEDED FORUANXIETY. MAY ADD TO BUSPIRONE AND XANAX DURING TAPER., Disp: , Rfl:    lidocaine (XYLOCAINE) 5 % ointment, APPLY TO AFFECTED AREAS TWICE DAILY AS NEEDED., Disp: 70.88 g, Rfl: 3   lisinopril (PRINIVIL,ZESTRIL) 20 MG tablet, Take 20 mg by mouth daily. , Disp: , Rfl: 4   omeprazole (PRILOSEC) 20 MG capsule, Take 1 capsule (20 mg total) by mouth 2 (two) times daily before a meal. 1 PO 30 mins prior to breakfast and supper, Disp: 60 capsule, Rfl: 5   oxyCODONE HCl 30 MG TABA, Take 30 mg  by mouth every 4 (four) hours as needed (for pain). , Disp: , Rfl: 0   primidone (MYSOLINE) 50 MG tablet, Take 1 tablet (50 mg total) by mouth 2 (two) times daily. (Patient taking differently: Take 50 mg by mouth 2 (two) times daily. ), Disp: 60 tablet, Rfl: 11   VOLTAREN 1 % GEL, Apply 2 g topically 4 (four) times daily. , Disp: , Rfl:   Social History   Tobacco Use  Smoking Status Former Smoker   Packs/day: 2.50   Years: 25.00   Pack years: 62.50   Types: Cigarettes   Quit date: 08/05/1984   Years since quitting: 35.1  Smokeless Tobacco Never Used    No Known Allergies Objective:  There were no vitals filed for this visit. There is no height or weight on file to calculate BMI. Constitutional Well developed. Well nourished.  Vascular Foot warm and well perfused. Capillary refill normal to all digits.   Neurologic Normal speech. Oriented to person, place, and time. Epicritic sensation to light touch grossly present bilaterally.  Dermatologic Wound edges with some suture failure and edema. No purulence. No warmth or erythema. No signs of acute infeciton   Orthopedic:  History of left hallux amputation   Radiographs: No x-rays taken today Assessment:   1. Post-operative state   2. Localized edema    Plan:  Patient was evaluated and treated and all questions answered.  S/p foot surgery left -Discussed hte importance of compliance with antibiotics . While it does not appear clinically infected today I am concerned he is not taking his abx appropriately. Will refill for an additional week. -Discussed importance of staying off of his foot and wearing of his CAM boot. Unna boot applied for reduction of edema -F/u next week for recheck.   No follow-ups on file.

## 2019-10-03 ENCOUNTER — Other Ambulatory Visit: Payer: Self-pay

## 2019-10-03 ENCOUNTER — Ambulatory Visit (INDEPENDENT_AMBULATORY_CARE_PROVIDER_SITE_OTHER): Payer: Medicaid Other | Admitting: Podiatry

## 2019-10-03 DIAGNOSIS — Z9889 Other specified postprocedural states: Secondary | ICD-10-CM

## 2019-10-03 DIAGNOSIS — R600 Localized edema: Secondary | ICD-10-CM

## 2019-10-11 ENCOUNTER — Other Ambulatory Visit: Payer: Self-pay

## 2019-10-11 ENCOUNTER — Inpatient Hospital Stay (HOSPITAL_COMMUNITY): Payer: Medicaid Other

## 2019-10-11 ENCOUNTER — Ambulatory Visit (INDEPENDENT_AMBULATORY_CARE_PROVIDER_SITE_OTHER): Payer: Medicaid Other

## 2019-10-11 ENCOUNTER — Ambulatory Visit (INDEPENDENT_AMBULATORY_CARE_PROVIDER_SITE_OTHER): Payer: Medicaid Other | Admitting: Podiatry

## 2019-10-11 ENCOUNTER — Inpatient Hospital Stay (HOSPITAL_COMMUNITY)
Admission: AD | Admit: 2019-10-11 | Discharge: 2019-10-18 | DRG: 857 | Disposition: A | Discharge status: Home / Self Care | Payer: Medicaid Other | Source: Ambulatory Visit | Attending: Internal Medicine | Admitting: Internal Medicine

## 2019-10-11 VITALS — Temp 97.2°F

## 2019-10-11 DIAGNOSIS — K219 Gastro-esophageal reflux disease without esophagitis: Secondary | ICD-10-CM | POA: Diagnosis present

## 2019-10-11 DIAGNOSIS — L02612 Cutaneous abscess of left foot: Secondary | ICD-10-CM | POA: Diagnosis present

## 2019-10-11 DIAGNOSIS — M86672 Other chronic osteomyelitis, left ankle and foot: Secondary | ICD-10-CM | POA: Diagnosis not present

## 2019-10-11 DIAGNOSIS — Z9889 Other specified postprocedural states: Secondary | ICD-10-CM

## 2019-10-11 DIAGNOSIS — E1159 Type 2 diabetes mellitus with other circulatory complications: Secondary | ICD-10-CM | POA: Diagnosis present

## 2019-10-11 DIAGNOSIS — E669 Obesity, unspecified: Secondary | ICD-10-CM | POA: Diagnosis present

## 2019-10-11 DIAGNOSIS — E7800 Pure hypercholesterolemia, unspecified: Secondary | ICD-10-CM | POA: Diagnosis present

## 2019-10-11 DIAGNOSIS — E785 Hyperlipidemia, unspecified: Secondary | ICD-10-CM | POA: Diagnosis present

## 2019-10-11 DIAGNOSIS — E11621 Type 2 diabetes mellitus with foot ulcer: Secondary | ICD-10-CM | POA: Diagnosis present

## 2019-10-11 DIAGNOSIS — I152 Hypertension secondary to endocrine disorders: Secondary | ICD-10-CM | POA: Diagnosis present

## 2019-10-11 DIAGNOSIS — E11628 Type 2 diabetes mellitus with other skin complications: Secondary | ICD-10-CM | POA: Diagnosis present

## 2019-10-11 DIAGNOSIS — D62 Acute posthemorrhagic anemia: Secondary | ICD-10-CM | POA: Diagnosis not present

## 2019-10-11 DIAGNOSIS — Z6835 Body mass index (BMI) 35.0-35.9, adult: Secondary | ICD-10-CM

## 2019-10-11 DIAGNOSIS — M86172 Other acute osteomyelitis, left ankle and foot: Secondary | ICD-10-CM

## 2019-10-11 DIAGNOSIS — F1010 Alcohol abuse, uncomplicated: Secondary | ICD-10-CM | POA: Diagnosis present

## 2019-10-11 DIAGNOSIS — L03116 Cellulitis of left lower limb: Secondary | ICD-10-CM | POA: Diagnosis present

## 2019-10-11 DIAGNOSIS — G250 Essential tremor: Secondary | ICD-10-CM | POA: Diagnosis present

## 2019-10-11 DIAGNOSIS — M869 Osteomyelitis, unspecified: Secondary | ICD-10-CM | POA: Diagnosis present

## 2019-10-11 DIAGNOSIS — T8130XA Disruption of wound, unspecified, initial encounter: Secondary | ICD-10-CM

## 2019-10-11 DIAGNOSIS — Z87891 Personal history of nicotine dependence: Secondary | ICD-10-CM

## 2019-10-11 DIAGNOSIS — J45909 Unspecified asthma, uncomplicated: Secondary | ICD-10-CM | POA: Diagnosis present

## 2019-10-11 DIAGNOSIS — L03032 Cellulitis of left toe: Secondary | ICD-10-CM

## 2019-10-11 DIAGNOSIS — F329 Major depressive disorder, single episode, unspecified: Secondary | ICD-10-CM | POA: Diagnosis present

## 2019-10-11 DIAGNOSIS — E1151 Type 2 diabetes mellitus with diabetic peripheral angiopathy without gangrene: Secondary | ICD-10-CM | POA: Diagnosis present

## 2019-10-11 DIAGNOSIS — E0841 Diabetes mellitus due to underlying condition with diabetic mononeuropathy: Secondary | ICD-10-CM | POA: Diagnosis not present

## 2019-10-11 DIAGNOSIS — E1142 Type 2 diabetes mellitus with diabetic polyneuropathy: Secondary | ICD-10-CM | POA: Diagnosis present

## 2019-10-11 DIAGNOSIS — T8149XA Infection following a procedure, other surgical site, initial encounter: Secondary | ICD-10-CM | POA: Diagnosis present

## 2019-10-11 DIAGNOSIS — E1140 Type 2 diabetes mellitus with diabetic neuropathy, unspecified: Secondary | ICD-10-CM | POA: Diagnosis present

## 2019-10-11 DIAGNOSIS — I10 Essential (primary) hypertension: Secondary | ICD-10-CM | POA: Diagnosis present

## 2019-10-11 DIAGNOSIS — L97526 Non-pressure chronic ulcer of other part of left foot with bone involvement without evidence of necrosis: Secondary | ICD-10-CM | POA: Diagnosis present

## 2019-10-11 DIAGNOSIS — E0842 Diabetes mellitus due to underlying condition with diabetic polyneuropathy: Secondary | ICD-10-CM | POA: Diagnosis not present

## 2019-10-11 DIAGNOSIS — Y838 Other surgical procedures as the cause of abnormal reaction of the patient, or of later complication, without mention of misadventure at the time of the procedure: Secondary | ICD-10-CM | POA: Diagnosis present

## 2019-10-11 DIAGNOSIS — L089 Local infection of the skin and subcutaneous tissue, unspecified: Secondary | ICD-10-CM | POA: Diagnosis not present

## 2019-10-11 DIAGNOSIS — Z8249 Family history of ischemic heart disease and other diseases of the circulatory system: Secondary | ICD-10-CM

## 2019-10-11 DIAGNOSIS — E1169 Type 2 diabetes mellitus with other specified complication: Secondary | ICD-10-CM | POA: Diagnosis present

## 2019-10-11 DIAGNOSIS — Z20828 Contact with and (suspected) exposure to other viral communicable diseases: Secondary | ICD-10-CM | POA: Diagnosis present

## 2019-10-11 DIAGNOSIS — Z89412 Acquired absence of left great toe: Secondary | ICD-10-CM

## 2019-10-11 DIAGNOSIS — Z79899 Other long term (current) drug therapy: Secondary | ICD-10-CM

## 2019-10-11 DIAGNOSIS — Z7984 Long term (current) use of oral hypoglycemic drugs: Secondary | ICD-10-CM

## 2019-10-11 DIAGNOSIS — B9561 Methicillin susceptible Staphylococcus aureus infection as the cause of diseases classified elsewhere: Secondary | ICD-10-CM | POA: Diagnosis present

## 2019-10-11 LAB — CBC WITH DIFFERENTIAL/PLATELET
Abs Immature Granulocytes: 0.06 10*3/uL (ref 0.00–0.07)
Basophils Absolute: 0 10*3/uL (ref 0.0–0.1)
Basophils Relative: 1 %
Eosinophils Absolute: 0.1 10*3/uL (ref 0.0–0.5)
Eosinophils Relative: 2 %
HCT: 28.4 % — ABNORMAL LOW (ref 39.0–52.0)
Hemoglobin: 9 g/dL — ABNORMAL LOW (ref 13.0–17.0)
Immature Granulocytes: 1 %
Lymphocytes Relative: 20 %
Lymphs Abs: 1.1 10*3/uL (ref 0.7–4.0)
MCH: 30.7 pg (ref 26.0–34.0)
MCHC: 31.7 g/dL (ref 30.0–36.0)
MCV: 96.9 fL (ref 80.0–100.0)
Monocytes Absolute: 0.4 10*3/uL (ref 0.1–1.0)
Monocytes Relative: 7 %
Neutro Abs: 3.9 10*3/uL (ref 1.7–7.7)
Neutrophils Relative %: 69 %
Platelets: 127 10*3/uL — ABNORMAL LOW (ref 150–400)
RBC: 2.93 MIL/uL — ABNORMAL LOW (ref 4.22–5.81)
RDW: 15 % (ref 11.5–15.5)
WBC: 5.6 10*3/uL (ref 4.0–10.5)
nRBC: 0 % (ref 0.0–0.2)

## 2019-10-11 LAB — PHOSPHORUS: Phosphorus: 3.2 mg/dL (ref 2.5–4.6)

## 2019-10-11 LAB — COMPREHENSIVE METABOLIC PANEL
ALT: 12 U/L (ref 0–44)
AST: 13 U/L — ABNORMAL LOW (ref 15–41)
Albumin: 3.5 g/dL (ref 3.5–5.0)
Alkaline Phosphatase: 85 U/L (ref 38–126)
Anion gap: 7 (ref 5–15)
BUN: 19 mg/dL (ref 6–20)
CO2: 26 mmol/L (ref 22–32)
Calcium: 9 mg/dL (ref 8.9–10.3)
Chloride: 104 mmol/L (ref 98–111)
Creatinine, Ser: 0.94 mg/dL (ref 0.61–1.24)
GFR calc Af Amer: >60 mL/min (ref >60)
GFR calc non Af Amer: >60 mL/min (ref >60)
Glucose, Bld: 163 mg/dL — ABNORMAL HIGH (ref 70–99)
Potassium: 3.9 mmol/L (ref 3.5–5.1)
Sodium: 137 mmol/L (ref 135–145)
Total Bilirubin: 0.8 mg/dL (ref 0.3–1.2)
Total Protein: 7 g/dL (ref 6.5–8.1)

## 2019-10-11 LAB — PREALBUMIN: Prealbumin: 14 mg/dL — ABNORMAL LOW (ref 18–38)

## 2019-10-11 LAB — C-REACTIVE PROTEIN: CRP: 5.5 mg/dL — ABNORMAL HIGH (ref <1.0)

## 2019-10-11 LAB — HEMOGLOBIN A1C
Hgb A1c MFr Bld: 5.3 % (ref 4.8–5.6)
Mean Plasma Glucose: 105.41 mg/dL

## 2019-10-11 LAB — SEDIMENTATION RATE: Sed Rate: 50 mm/hr — ABNORMAL HIGH (ref 0–16)

## 2019-10-11 LAB — PROTIME-INR
INR: 1 (ref 0.8–1.2)
Prothrombin Time: 13.4 seconds (ref 11.4–15.2)

## 2019-10-11 LAB — MAGNESIUM: Magnesium: 2.1 mg/dL (ref 1.7–2.4)

## 2019-10-11 MED ORDER — ADULT MULTIVITAMIN W/MINERALS CH
1.0000 | ORAL_TABLET | Freq: Every day | ORAL | Status: DC
  Administered 2019-10-12 – 2019-10-18 (×7): 1 via ORAL
  Filled 2019-10-11 (×7): qty 1

## 2019-10-11 MED ORDER — SODIUM CHLORIDE 0.9 % IV SOLN
2.0000 g | Freq: Once | INTRAVENOUS | Status: DC
  Filled 2019-10-11: qty 2

## 2019-10-11 MED ORDER — LORAZEPAM 2 MG/ML IJ SOLN: 1.0000 mg | INTRAMUSCULAR | Status: AC | PRN

## 2019-10-11 MED ORDER — ONDANSETRON HCL 4 MG PO TABS: 4.0000 mg | ORAL_TABLET | Freq: Four times a day (QID) | ORAL | Status: DC | PRN

## 2019-10-11 MED ORDER — FOLIC ACID 1 MG PO TABS
1.0000 mg | ORAL_TABLET | Freq: Every day | ORAL | Status: DC
  Administered 2019-10-12 – 2019-10-18 (×7): 1 mg via ORAL
  Filled 2019-10-11 (×7): qty 1

## 2019-10-11 MED ORDER — PANTOPRAZOLE SODIUM 40 MG PO TBEC
40.0000 mg | DELAYED_RELEASE_TABLET | Freq: Two times a day (BID) | ORAL | Status: DC
  Administered 2019-10-12 – 2019-10-18 (×13): 40 mg via ORAL
  Filled 2019-10-11 (×13): qty 1

## 2019-10-11 MED ORDER — DULOXETINE HCL 60 MG PO CPEP
60.0000 mg | ORAL_CAPSULE | Freq: Every day | ORAL | Status: DC
  Administered 2019-10-12 – 2019-10-18 (×7): 60 mg via ORAL
  Filled 2019-10-11 (×7): qty 1

## 2019-10-11 MED ORDER — AMITRIPTYLINE HCL 100 MG PO TABS
100.0000 mg | ORAL_TABLET | Freq: Every day | ORAL | Status: DC
  Administered 2019-10-12 – 2019-10-17 (×6): 100 mg via ORAL
  Filled 2019-10-11 (×9): qty 1

## 2019-10-11 MED ORDER — THIAMINE HCL 100 MG/ML IJ SOLN: 100.0000 mg | Freq: Every day | INTRAMUSCULAR | Status: DC

## 2019-10-11 MED ORDER — ACETAMINOPHEN 650 MG RE SUPP: 650.0000 mg | Freq: Four times a day (QID) | RECTAL | Status: DC | PRN

## 2019-10-11 MED ORDER — GADOBUTROL 1 MMOL/ML IV SOLN
10.0000 mL | Freq: Once | INTRAVENOUS | Status: AC | PRN
  Administered 2019-10-11: 10 mL via INTRAVENOUS

## 2019-10-11 MED ORDER — GABAPENTIN 300 MG PO CAPS
900.0000 mg | ORAL_CAPSULE | Freq: Two times a day (BID) | ORAL | Status: DC
  Administered 2019-10-12 – 2019-10-13 (×3): 900 mg via ORAL
  Filled 2019-10-11 (×3): qty 3
  Filled 2019-10-11: qty 9
  Filled 2019-10-11 (×2): qty 3

## 2019-10-11 MED ORDER — ACETAMINOPHEN 325 MG PO TABS: 650.0000 mg | ORAL_TABLET | Freq: Four times a day (QID) | ORAL | Status: DC | PRN

## 2019-10-11 MED ORDER — ONDANSETRON HCL 4 MG/2ML IJ SOLN: 4.0000 mg | Freq: Four times a day (QID) | INTRAMUSCULAR | Status: DC | PRN

## 2019-10-11 MED ORDER — GLYCOPYRROLATE 1 MG PO TABS
1.0000 mg | ORAL_TABLET | Freq: Two times a day (BID) | ORAL | Status: DC
  Administered 2019-10-12 – 2019-10-18 (×13): 1 mg via ORAL
  Filled 2019-10-11 (×15): qty 1

## 2019-10-11 MED ORDER — ATORVASTATIN CALCIUM 40 MG PO TABS
40.0000 mg | ORAL_TABLET | Freq: Every evening | ORAL | Status: DC
  Administered 2019-10-12 – 2019-10-17 (×6): 40 mg via ORAL
  Filled 2019-10-11 (×6): qty 1

## 2019-10-11 MED ORDER — HYDROXYZINE HCL 25 MG PO TABS: 25.0000 mg | ORAL_TABLET | Freq: Four times a day (QID) | ORAL | Status: DC | PRN

## 2019-10-11 MED ORDER — THIAMINE HCL 100 MG PO TABS
100.0000 mg | ORAL_TABLET | Freq: Every day | ORAL | Status: DC
  Administered 2019-10-12 – 2019-10-18 (×7): 100 mg via ORAL
  Filled 2019-10-11 (×7): qty 1

## 2019-10-11 MED ORDER — SODIUM CHLORIDE 0.9 % IV SOLN
2.0000 g | Freq: Three times a day (TID) | INTRAVENOUS | Status: DC
  Administered 2019-10-12 – 2019-10-18 (×20): 2 g via INTRAVENOUS
  Filled 2019-10-11 (×22): qty 2

## 2019-10-11 MED ORDER — PRIMIDONE 50 MG PO TABS
50.0000 mg | ORAL_TABLET | Freq: Two times a day (BID) | ORAL | Status: DC
  Administered 2019-10-12 – 2019-10-18 (×13): 50 mg via ORAL
  Filled 2019-10-11 (×15): qty 1

## 2019-10-11 MED ORDER — OXYCODONE HCL 5 MG PO TABS
30.0000 mg | ORAL_TABLET | ORAL | Status: DC | PRN
  Administered 2019-10-12 – 2019-10-18 (×15): 30 mg via ORAL
  Filled 2019-10-11 (×16): qty 6

## 2019-10-11 MED ORDER — LISINOPRIL 20 MG PO TABS
20.0000 mg | ORAL_TABLET | Freq: Every day | ORAL | Status: DC
  Administered 2019-10-12 – 2019-10-18 (×6): 20 mg via ORAL
  Filled 2019-10-11 (×7): qty 1

## 2019-10-11 MED ORDER — GABAPENTIN 400 MG PO CAPS
1200.0000 mg | ORAL_CAPSULE | Freq: Every day | ORAL | Status: DC
  Administered 2019-10-13 – 2019-10-14 (×3): 1200 mg via ORAL
  Filled 2019-10-11: qty 3
  Filled 2019-10-11: qty 4
  Filled 2019-10-11: qty 3

## 2019-10-11 MED ORDER — LORAZEPAM 1 MG PO TABS: 1.0000 mg | ORAL_TABLET | ORAL | Status: AC | PRN

## 2019-10-11 MED ORDER — ALBUTEROL SULFATE HFA 108 (90 BASE) MCG/ACT IN AERS
2.0000 | INHALATION_SPRAY | Freq: Four times a day (QID) | RESPIRATORY_TRACT | Status: DC | PRN
  Administered 2019-10-16: 2 via RESPIRATORY_TRACT

## 2019-10-11 MED ORDER — INSULIN ASPART 100 UNIT/ML ~~LOC~~ SOLN
0.0000 [IU] | SUBCUTANEOUS | Status: DC
  Administered 2019-10-12 (×2): 2 [IU] via SUBCUTANEOUS
  Administered 2019-10-12: 3 [IU] via SUBCUTANEOUS
  Administered 2019-10-12: 1 [IU] via SUBCUTANEOUS
  Administered 2019-10-12 (×2): 2 [IU] via SUBCUTANEOUS
  Administered 2019-10-13 (×2): 1 [IU] via SUBCUTANEOUS
  Administered 2019-10-13: 2 [IU] via SUBCUTANEOUS
  Administered 2019-10-13 (×2): 3 [IU] via SUBCUTANEOUS
  Administered 2019-10-14: 1 [IU] via SUBCUTANEOUS
  Administered 2019-10-14: 2 [IU] via SUBCUTANEOUS

## 2019-10-11 MED ORDER — DOCUSATE SODIUM 100 MG PO CAPS: 100.0000 mg | ORAL_CAPSULE | Freq: Every day | ORAL | Status: DC | PRN

## 2019-10-11 NOTE — Progress Notes (Signed)
Pharmacy Antibiotic Note  Eugene Diaz is a 53 y.o. male admitted on 10/11/2019 with left foot wound positive for pseudomonas in Nov.  Pharmacy has been consulted for cefepime dosing.  Plan: Cefepime 2g IV q8 Will sign off    Temp (24hrs), Avg:98.1 F (36.7 C), Min:97.2 F (36.2 C), Max:98.9 F (37.2 C)  Recent Labs  Lab 10/11/19 2015 10/11/19 2016  WBC 5.6  --   CREATININE  --  0.94    CrCl cannot be calculated (Unknown ideal weight.).    No Known Allergies   Thank you for allowing pharmacy to be a part of this patient's care.  Kara Mead 10/11/2019 8:53 PM

## 2019-10-11 NOTE — Progress Notes (Deleted)
Pharmacy Antibiotic Note  Eugene Diaz is a 53 y.o. male admitted on 10/11/2019 with left foot wound positive for pseudomonas in Nov.  Pharmacy has been consulted for cefepime dosing.  Plan: Cefepime 2g IV q8     Temp (24hrs), Avg:98.1 F (36.7 C), Min:97.2 F (36.2 C), Max:98.9 F (37.2 C)  Recent Labs  Lab 10/11/19 2015 10/11/19 2016  WBC 5.6  --   CREATININE  --  0.94    CrCl cannot be calculated (Unknown ideal weight.).    No Known Allergies   Thank you for allowing pharmacy to be a part of this patient's care.  Kara Mead 10/11/2019 8:53 PM

## 2019-10-11 NOTE — Progress Notes (Signed)
Subjective:  Patient ID: Eugene Diaz, male    DOB: 1966/09/27,  MRN: [id number]  Chief Complaint  Patient presents with  . Routine Post Op    DOS 09/04/2019 SECONDARY/DELAYED CLOSURE LT. Pt states got wound wet yesterday morning but changed bandage immediatley. Denies fever/nausea/vomiting/chills.   DOS: 07/16/2019 Procedure: Open reduction, perc pinning left hallux fracture, incision and drainage left hallux.  DOS: 08/14/2019 Procedure: amoutaton left hallux  DOS: 09/04/2019 Procedure: Delayed wound closure - repair of complex dehiscence  53 y.o. male returns for post-op check. Dressing has gotten wet. Poor hygiene.   Review of Systems: Negative except as noted in the HPI. Denies N/V/F/Ch.  Past Medical History:  Diagnosis Date  . Alcohol abuse   . Alcoholic peripheral neuropathy ([place])   . Anemia   . Anxiety   . Arthritis   . Asthma    followed by pcp  . B12 deficiency   . Cellulitis of left lower leg    07-2019 and left foot  . Depression   . Diabetic neuropathy ([place])   . Essential tremor    neurologist-- dr patel--- due to alcohol abuse  . GERD (gastroesophageal reflux disease)   . History of esophageal stricture    s/p  dilatation 02-13-2018  . Hypercholesterolemia   . Hypertension    followed by pcp  (nuclear stress test 03-11-2014 low risk w/ no ischemia, ef 65%  . Osteomyelitis of great toe ([place])    left foot s/p amputation 08-14-2019  . Peripheral vascular disease ([place])   . Thrombocytopenia (HCC)    chronic  . Type 2 diabetes mellitus ([place])    followed by pcp--- last A1c in epic 6.6 on 07-12-2019  . Wound dehiscence, surgical    post left great toe amputation 08-14-2019    Current Outpatient Medications:  .  ACCU-CHEK GUIDE test strip, USE TO TEST BLOOD SUGARTDAILY AS DIRECTED., Disp: , Rfl:  .  Accu-Chek Softclix Lancets lancets, USE TO TEST BLOOD SUGARADAILY AS DIRECTED., Disp: , Rfl:  .  albuterol (PROVENTIL HFA;VENTOLIN HFA) 108 (90 Base)  MCG/ACT inhaler, Inhale 2 puffs into the lungs every 6 (six) hours as needed for wheezing or shortness of breath., Disp: 1 Inhaler, Rfl: 2 .  amitriptyline (ELAVIL) 100 MG tablet, Take 100 mg by mouth at bedtime., Disp: , Rfl:  .  atorvastatin (LIPITOR) 40 MG tablet, Take 40 mg by mouth every evening. , Disp: , Rfl: 10 .  Blood Glucose Monitoring Suppl (ACCU-CHEK GUIDE) w/Device KIT, USE TO TEST BLOOD SUGARAAS DIRECTED BY DOCTOR., Disp: , Rfl:  .  canagliflozin (INVOKANA) 300 MG TABS tablet, 300 mg daily before breakfast. , Disp: , Rfl:  .  ciprofloxacin (CIPRO) 500 MG tablet, Take 1 tablet (500 mg total) by mouth 2 (two) times daily., Disp: 14 tablet, Rfl: 0 .  diclofenac Sodium (VOLTAREN) 1 % GEL, APPLY UP TO 4 TIMES DAILYWAS DIRECTED., Disp: , Rfl:  .  docusate sodium (COLACE) 100 MG capsule, Take 100 mg by mouth daily as needed for mild constipation., Disp: , Rfl:  .  DULoxetine (CYMBALTA) 60 MG capsule, Take 60 mg by mouth daily. , Disp: , Rfl: 10 .  furosemide (LASIX) 20 MG tablet, Take 20 mg by mouth daily. , Disp: , Rfl:  .  gabapentin (NEURONTIN) 300 MG capsule, Take 974m in the morning and at noon, then take 1201mat bedtime (Patient taking differently: Take 90040mn the morning and at noon, then take 1200m2m bedtime), Disp: 300 capsule, Rfl:  11 .  glimepiride (AMARYL) 4 MG tablet, Take 4 mg by mouth daily., Disp: , Rfl:  .  glycopyrrolate (ROBINUL) 1 MG tablet, Take 1 mg by mouth 2 (two) times daily. , Disp: , Rfl: 2 .  hydrOXYzine (ATARAX/VISTARIL) 25 MG tablet, TAKE 1 TABLET BY MOUTH 4OTIMES DAILY AS NEEDED FORUANXIETY. MAY ADD TO BUSPIRONE AND XANAX DURING TAPER., Disp: , Rfl:  .  lidocaine (XYLOCAINE) 5 % ointment, APPLY TO AFFECTED AREAS TWICE DAILY AS NEEDED., Disp: 70.88 g, Rfl: 3 .  lisinopril (PRINIVIL,ZESTRIL) 20 MG tablet, Take 20 mg by mouth daily. , Disp: , Rfl: 4 .  omeprazole (PRILOSEC) 20 MG capsule, Take 1 capsule (20 mg total) by mouth 2 (two) times daily before a  meal. 1 PO 30 mins prior to breakfast and supper, Disp: 60 capsule, Rfl: 5 .  oxyCODONE HCl 30 MG TABA, Take 30 mg by mouth every 4 (four) hours as needed (for pain). , Disp: , Rfl: 0 .  primidone (MYSOLINE) 50 MG tablet, Take 1 tablet (50 mg total) by mouth 2 (two) times daily. (Patient taking differently: Take 50 mg by mouth 2 (two) times daily. ), Disp: 60 tablet, Rfl: 11 .  VOLTAREN 1 % GEL, Apply 2 g topically 4 (four) times daily. , Disp: , Rfl:   Social History   Tobacco Use  Smoking Status Former Smoker  . Packs/day: 2.50  . Years: 25.00  . Pack years: 62.50  . Types: Cigarettes  . Quit date: 08/05/1984  . Years since quitting: 35.2  Smokeless Tobacco Never Used    No Known Allergies Objective:   Vitals:   10/11/19 1343  Temp: (!) 97.2 F (36.2 C)   There is no height or weight on file to calculate BMI. Constitutional Well developed. Well nourished.  Vascular Foot warm and well perfused. Capillary refill normal to all digits.   Neurologic Normal speech. Oriented to person, place, and time. Epicritic sensation to light touch grossly present bilaterally.  Dermatologic Wound edematous with seropurulent drainage, surrounding erythema. Left leg erythema noted with edema   Orthopedic:  History of left hallux amputation   Radiographs: No x-rays taken today Assessment:   1. Cellulitis and abscess of toe of left foot    Plan:  Patient was evaluated and treated and all questions answered.  S/p foot surgery left -XR taken today show osteomyelitis of the 1st metatarsal.  -Given his history I am concerned about delaying surgery and think he would best benefit from IV Abx and admission for IV abx and 1st metatarsal resection. Will contact hospitalist for direct admission. He will need MRI prior to surgery   No follow-ups on file.

## 2019-10-11 NOTE — H&P (Signed)
History and Physical    Eugene Diaz JJO:[id number] DOB: December 30, 1965 DOA: 10/11/2019  PCP: [name], PA-C  Patient coming from: Home  I have personally briefly reviewed patient's old medical records in [place]  Chief Complaint: L foot wound  HPI: Eugene Diaz is a 53 y.o. male with medical history significant of DM2, EtOH abuse, peripheral neuropathy.  Patient presenting as direct admit from podiatry due to cellulitis and osteomyelitis of L foot wound.  On 9/15 patient had Open reduction, perc pinning left hallux fracture, incision and drainage left hallux.  On 10/14 patient had amputation left hallux.  Cultures grew MSSA  On 11/4 had repair of complex dehiscence.  Cultures grew Pseudomonas.  Today patient with grossly infected wound on exam at podiatry office.  X ray shows osteomyelitis.  Sent in for direct admit.    Review of Systems: As per HPI, otherwise all review of systems negative.  Past Medical History:  Diagnosis Date  . Alcohol abuse   . Alcoholic peripheral neuropathy ([place])   . Anemia   . Anxiety   . Arthritis   . Asthma    followed by pcp  . B12 deficiency   . Cellulitis of left lower leg    07-2019 and left foot  . Depression   . Diabetic neuropathy ([place])   . Essential tremor    neurologist-- dr patel--- due to alcohol abuse  . GERD (gastroesophageal reflux disease)   . History of esophageal stricture    s/p  dilatation 02-13-2018  . Hypercholesterolemia   . Hypertension    followed by pcp  (nuclear stress test 03-11-2014 low risk w/ no ischemia, ef 65%  . Osteomyelitis of great toe ([place])    left foot s/p amputation 08-14-2019  . Peripheral vascular disease ([place])   . Thrombocytopenia (HCC)    chronic  . Type 2 diabetes mellitus ([place])    followed by pcp--- last A1c in epic 6.6 on 07-12-2019  . Wound dehiscence, surgical    post left great toe amputation 08-14-2019    Past Surgical History:  Procedure Laterality Date  .  AMPUTATION TOE Left 08/14/2019   Procedure: AMPUTATION GREAT TOE;  Surgeon: Evelina Bucy, DPM;  Location: [facility];  Service: Podiatry;  Laterality: Left;  . BIOPSY  02/13/2018   Procedure: BIOPSY;  Surgeon: Danie Binder, MD;  [facility];  Service: Endoscopy;;  transverse colon biopsy, gastric biopsy  . COLONOSCOPY WITH PROPOFOL N/A 02/13/2018   Procedure: COLONOSCOPY WITH PROPOFOL;  Surgeon: Danie Binder, MD;  [facility];  Service: Endoscopy;  Laterality: N/A;  11:15am  . ESOPHAGOGASTRODUODENOSCOPY (EGD) WITH PROPOFOL N/A 02/13/2018   Procedure: ESOPHAGOGASTRODUODENOSCOPY (EGD) WITH PROPOFOL;  Surgeon: Danie Binder, MD;  [facility];  Service: Endoscopy;  Laterality: N/A;  . I&D EXTREMITY Left 07/16/2019   Procedure: Insicion  AND DEBRIDEMENT EXTREMITY;  Surgeon: Evelina Bucy, DPM;  Location: [place];  Service: Podiatry;  Laterality: Left;  . Left arm     Left arm repair (tendon and artery)  . PERCUTANEOUS PINNING  07/16/2019   Procedure: Open Reduction Percutaneous Pinning Extremity;  Surgeon: Evelina Bucy, DPM;  Location: [place];  Service: Podiatry;;  . PILONIDAL CYST EXCISION N/A 08/08/2014   Procedure: CYST EXCISION PILONIDAL EXTENSIVE;  Surgeon: Jamesetta So, MD;  Location: [facility];  Service: General;  Laterality: N/A;  . POLYPECTOMY  02/13/2018   Procedure: POLYPECTOMY;  Surgeon: Danie Binder,  MD;  [facility];  Service: Endoscopy;;  transverse colon polyp hs, rectal polyps times 2  . SAVORY DILATION N/A 02/13/2018   Procedure: SAVORY DILATION;  Surgeon: Danie Binder, MD;  [facility];  Service: Endoscopy;  Laterality: N/A;  . WOUND DEBRIDEMENT Left 09/04/2019   Procedure: DEBRIDEMENT WOUND WITH COMPLEX  REPAIR OF DEHISCENCE;  Surgeon: Evelina Bucy, DPM;  Location: [facility];  Service: Podiatry;  Laterality: Left;  Leave patient on stretcher     reports that he quit smoking about 35 years  ago. His smoking use included cigarettes. He has a 62.50 pack-year smoking history. He has never used smokeless tobacco. He reports current alcohol use of about 50.0 standard drinks of alcohol per week. He reports current drug use. Drug: Marijuana.  No Known Allergies  Family History  Problem Relation Age of Onset  . Heart attack Mother        Living, 46  . Cancer Father        Deceased  . Healthy Brother   . Healthy Sister   . Colon cancer Neg Hx   . Colon polyps Neg Hx      Prior to Admission medications   Medication Sig Start Date End Date Taking? Authorizing Provider  ACCU-CHEK GUIDE test strip USE TO TEST BLOOD SUGARTDAILY AS DIRECTED. 07/20/19   [provider]  Accu-Chek Softclix Lancets lancets USE TO TEST BLOOD SUGARADAILY AS DIRECTED. 03/18/19   [provider]  albuterol (PROVENTIL HFA;VENTOLIN HFA) 108 (90 Base) MCG/ACT inhaler Inhale 2 puffs into the lungs every 6 (six) hours as needed for wheezing or shortness of breath. 10/31/18   Hayden Rasmussen, MD  amitriptyline (ELAVIL) 100 MG tablet Take 100 mg by mouth at bedtime.    [provider]  atorvastatin (LIPITOR) 40 MG tablet Take 40 mg by mouth every evening.  12/17/15   [provider]  Blood Glucose Monitoring Suppl (ACCU-CHEK GUIDE) w/Device KIT USE TO TEST BLOOD SUGARAAS DIRECTED BY DOCTOR. 03/18/19   [provider]  canagliflozin (INVOKANA) 300 MG TABS tablet 300 mg daily before breakfast.  11/16/15   [provider]  diclofenac Sodium (VOLTAREN) 1 % GEL APPLY UP TO 4 TIMES DAILYWAS DIRECTED. 09/13/19   [provider]  docusate sodium (COLACE) 100 MG capsule Take 100 mg by mouth daily as needed for mild constipation.    [provider]  DULoxetine (CYMBALTA) 60 MG capsule Take 60 mg by mouth daily.  12/11/15   [provider]  furosemide (LASIX) 20 MG tablet Take 20 mg by mouth daily.     [provider]  gabapentin (NEURONTIN) 300  MG capsule Take 954m in the morning and at noon, then take 12019mat bedtime Patient taking differently: Take 90044mn the morning and at noon, then take 1200m38m bedtime 06/14/19   Patel, Donika K, DO  glimepiride (AMARYL) 4 MG tablet Take 4 mg by mouth daily. 08/10/19   [provider]  glycopyrrolate (ROBINUL) 1 MG tablet Take 1 mg by mouth 2 (two) times daily.  12/17/15   [provider]  hydrOXYzine (ATARAX/VISTARIL) 25 MG tablet TAKE 1 TABLET BY MOUTH 4OTIMES DAILY AS NEEDED FORUANXIETY. MAY ADD TO BUSPIRONE AND XANAX DURING TAPER. 06/17/19   [provider]  lidocaine (XYLOCAINE) 5 % ointment APPLY TO AFFECTED AREAS TWICE DAILY AS NEEDED. 04/04/19   Patel, Donika K, DO  lisinopril (PRINIVIL,ZESTRIL) 20 MG tablet Take 20 mg by mouth  daily.  12/30/15   [provider]  omeprazole (PRILOSEC) 20 MG capsule Take 1 capsule (20 mg total) by mouth 2 (two) times daily before a meal. 1 PO 30 mins prior to breakfast and supper 03/05/19   Mahala Menghini, PA-C  oxyCODONE HCl 30 MG TABA Take 30 mg by mouth every 4 (four) hours as needed (for pain).  12/07/15   [provider]  primidone (MYSOLINE) 50 MG tablet Take 1 tablet (50 mg total) by mouth 2 (two) times daily. Patient taking differently: Take 50 mg by mouth 2 (two) times daily.  06/14/19   Patel, Donika K, DO  VOLTAREN 1 % GEL Apply 2 g topically 4 (four) times daily.  08/19/15   [provider]    Physical Exam: Vitals:   10/11/19 1940  BP: (!) 153/77  Pulse: 80  Temp: 98.9 F (37.2 C)  TempSrc: Oral  SpO2: 100%    Constitutional: NAD, calm, comfortable Eyes: PERRL, lids and conjunctivae normal ENMT: Mucous membranes are moist. Posterior pharynx clear of any exudate or lesions.Normal dentition.  Neck: normal, supple, no masses, no thyromegaly Respiratory: clear to auscultation bilaterally, no wheezing, no crackles. Normal respiratory effort. No accessory muscle use.  Cardiovascular: Regular  rate and rhythm, no murmurs / rubs / gallops. No extremity edema. 2+ pedal pulses. No carotid bruits.  Abdomen: no tenderness, no masses palpated. No hepatosplenomegaly. Bowel sounds positive.  Musculoskeletal: no clubbing / cyanosis. No joint deformity upper and lower extremities. Good ROM, no contractures. Normal muscle tone.  Skin: L foot wound erythematous, edematous, seropurulent drainage Neurologic: CN 2-12 grossly intact. Sensation intact, DTR normal. Strength 5/5 in all 4.  Psychiatric: Normal judgment and insight. Alert and oriented x 3. Normal mood.    Labs on Admission: I have personally reviewed following labs and imaging studies  CBC: No results for input(s): WBC, NEUTROABS, HGB, HCT, MCV, PLT in the last 168 hours. Basic Metabolic Panel: No results for input(s): NA, K, CL, CO2, GLUCOSE, BUN, CREATININE, CALCIUM, MG, PHOS in the last 168 hours. GFR: CrCl cannot be calculated (Patient's most recent lab result is older than the maximum 21 days allowed.). Liver Function Tests: No results for input(s): AST, ALT, ALKPHOS, BILITOT, PROT, ALBUMIN in the last 168 hours. No results for input(s): LIPASE, AMYLASE in the last 168 hours. No results for input(s): AMMONIA in the last 168 hours. Coagulation Profile: No results for input(s): INR, PROTIME in the last 168 hours. Cardiac Enzymes: No results for input(s): CKTOTAL, CKMB, CKMBINDEX, TROPONINI in the last 168 hours. BNP (last 3 results) No results for input(s): PROBNP in the last 8760 hours. HbA1C: No results for input(s): HGBA1C in the last 72 hours. CBG: No results for input(s): GLUCAP in the last 168 hours. Lipid Profile: No results for input(s): CHOL, HDL, LDLCALC, TRIG, CHOLHDL, LDLDIRECT in the last 72 hours. Thyroid Function Tests: No results for input(s): TSH, T4TOTAL, FREET4, T3FREE, THYROIDAB in the last 72 hours. Anemia Panel: No results for input(s): VITAMINB12, FOLATE, FERRITIN, TIBC, IRON, RETICCTPCT in the last  72 hours. Urine analysis:    Component Value Date/Time   COLORURINE YELLOW 08/14/2019 [place] 08/14/2019 1232   LABSPEC 1.020 08/14/2019 1232   PHURINE 5.0 08/14/2019 1232   GLUCOSEU NEGATIVE 08/14/2019 1232   HGBUR NEGATIVE 08/14/2019 [place] 08/14/2019 1232   KETONESUR NEGATIVE 08/14/2019 1232   PROTEINUR NEGATIVE 08/14/2019 1232   UROBILINOGEN 4.0 (H) 10/09/2008 1114   NITRITE NEGATIVE 08/14/2019  [place] 08/14/2019 1232    Radiological Exams on Admission: No results found.  EKG: Independently reviewed.  Assessment/Plan Principal Problem:   Acute osteomyelitis of toe of left foot (HCC) Active Problems:   Hypertension   ETOH abuse   Diabetic neuropathy (HCC)   Diabetic infection of left foot ([place])    1. Osteomyelitis of L foot - 1. Cultures grew Pseudomonas in Nov, MSSA in October 2. Starting cefepime 3. Wound pathway 4. Wound care consult 5. Checking admission labs 6. MRI of L foot as per Dr. Eleanora Neighbor PN 7. NPO after MN 2. HTN - 1. Continue Lisinopril 2. Hold Lasix 3. DM2 - 1. Hold home Po hypoglycemics 2. Sensitive SSI Q4H 4. EtOH abuse - 1. CIWA 5. Chronic neuropathic pain - 1. Continue home pain meds including scheduled neurontin and oxycodone PRN  DVT prophylaxis: SCDs Code Status: Full Family Communication: No family in room Disposition Plan: Home after admit Consults called: [name] sent patient in as direct admit Admission status: Admit to inpatient  Severity of Illness: The appropriate patient status for this patient is INPATIENT. Inpatient status is judged to be reasonable and necessary in order to provide the required intensity of service to ensure the patient's safety. The patient's presenting symptoms, physical exam findings, and initial radiographic and laboratory data in the context of their chronic comorbidities is felt to place them at high risk for further clinical deterioration.  Furthermore, it is not anticipated that the patient will be medically stable for discharge from the hospital within 2 midnights of admission. The following factors support the patient status of inpatient.   IP status for treatment of osteomyelitis of L foot with infected wound.  * I certify that at the point of admission it is my clinical judgment that the patient will require inpatient hospital care spanning beyond 2 midnights from the point of admission due to high intensity of service, high risk for further deterioration and high frequency of surveillance required.*    Jordi Kamm M. DO [facility]  How to contact the [facility] Attending or Consulting provider [place] or covering provider during after hours [place], for this patient?  1. Check the care team in [facility] and look for a) attending/consulting [facility] provider listed and b) the [facility] team listed 2. Log into [email or web address]  Amion Physician Scheduling and messaging for groups and whole hospitals  On call and physician scheduling software for group practices, residents, hospitalists and other medical providers for call, clinic, rotation and shift schedules. OnCall [employer] is a hospital-wide system for scheduling doctors and paging doctors on call. EasyPlot is for scientific plotting and data analysis.  [email or web address]  and use [place]'s universal password to access. If you do not have the password, please contact the hospital operator.  3. Locate the [facility] provider you are looking for under [facility] and page to a number that you can be directly reached. 4. If you still have difficulty reaching the provider, please page the [facility] (Director on Call) for the Hospitalists listed on amion for assistance.  10/11/2019, 8:31 PM

## 2019-10-12 ENCOUNTER — Encounter (HOSPITAL_COMMUNITY): Payer: Medicaid Other

## 2019-10-12 ENCOUNTER — Encounter (HOSPITAL_COMMUNITY)
Admission: AD | Disposition: A | Discharge status: Home / Self Care | Payer: Self-pay | Source: Ambulatory Visit | Attending: Internal Medicine

## 2019-10-12 ENCOUNTER — Encounter (HOSPITAL_COMMUNITY): Payer: Self-pay | Admitting: Internal Medicine

## 2019-10-12 LAB — GLUCOSE, CAPILLARY
Glucose-Capillary: 128 mg/dL — ABNORMAL HIGH (ref 70–99)
Glucose-Capillary: 153 mg/dL — ABNORMAL HIGH (ref 70–99)
Glucose-Capillary: 160 mg/dL — ABNORMAL HIGH (ref 70–99)
Glucose-Capillary: 165 mg/dL — ABNORMAL HIGH (ref 70–99)
Glucose-Capillary: 169 mg/dL — ABNORMAL HIGH (ref 70–99)
Glucose-Capillary: 242 mg/dL — ABNORMAL HIGH (ref 70–99)

## 2019-10-12 LAB — BASIC METABOLIC PANEL WITH GFR
Anion gap: 9 (ref 5–15)
BUN: 15 mg/dL (ref 6–20)
CO2: 28 mmol/L (ref 22–32)
Calcium: 9.5 mg/dL (ref 8.9–10.3)
Chloride: 104 mmol/L (ref 98–111)
Creatinine, Ser: 0.94 mg/dL (ref 0.61–1.24)
GFR calc Af Amer: >60 mL/min (ref >60)
GFR calc non Af Amer: >60 mL/min (ref >60)
Glucose, Bld: 171 mg/dL — ABNORMAL HIGH (ref 70–99)
Potassium: 4.5 mmol/L (ref 3.5–5.1)
Sodium: 141 mmol/L (ref 135–145)

## 2019-10-12 LAB — CBC
HCT: 31.3 % — ABNORMAL LOW (ref 39.0–52.0)
Hemoglobin: 10 g/dL — ABNORMAL LOW (ref 13.0–17.0)
MCH: 31.3 pg (ref 26.0–34.0)
MCHC: 31.9 g/dL (ref 30.0–36.0)
MCV: 97.8 fL (ref 80.0–100.0)
Platelets: 121 K/uL — ABNORMAL LOW (ref 150–400)
RBC: 3.2 MIL/uL — ABNORMAL LOW (ref 4.22–5.81)
RDW: 15 % (ref 11.5–15.5)
WBC: 5 K/uL (ref 4.0–10.5)
nRBC: 0 % (ref 0.0–0.2)

## 2019-10-12 LAB — SURGICAL PCR SCREEN
MRSA, PCR: NEGATIVE
Staphylococcus aureus: NEGATIVE

## 2019-10-12 LAB — SARS CORONAVIRUS 2 (TAT 6-24 HRS): SARS Coronavirus 2: NEGATIVE

## 2019-10-12 LAB — HIV ANTIBODY (ROUTINE TESTING W REFLEX): HIV Screen 4th Generation wRfx: NONREACTIVE

## 2019-10-12 SURGERY — IRRIGATION AND DEBRIDEMENT FOOT
Anesthesia: Choice | Laterality: Left

## 2019-10-12 MED ORDER — SODIUM CHLORIDE 0.9 % IV SOLN
INTRAVENOUS | Status: DC | PRN
  Administered 2019-10-12: 250 mL via INTRAVENOUS
  Administered 2019-10-13: 1000 mL via INTRAVENOUS

## 2019-10-12 NOTE — Progress Notes (Signed)
Dr [name] rounded on pt recently. He did dressing change on lt foot and said to leave it wrapped til tomorrow. Plans for sx tomorrow.

## 2019-10-12 NOTE — Progress Notes (Signed)
Subjective:  Patient seen at bedside. Denies complaints. Surgery was scheduled for today but he did eat lunch.  Objective: Left foot with decreasing edema and cellulitis wound site dry without active purulence.  Assessment/Plan: Left first metatarsal osteomyelitis, abscess -MRI review consistent with the above. Plan for surgical intervention tomorrow for I&D of the abscess with first metatarsal resection. Dis cussed with patient that we likely will have to leave the wound open and return later to close it.  -Continue empiric antibiotics.  -Weight-bearing as tolerated to the left lower extremity in CAM boot.  -Plan for OR tomorrow for the above surgery. NPO after midnight.

## 2019-10-12 NOTE — Consult Note (Addendum)
WOC Nurse Consult Note: Reason for Consult:Left hallux post operative wound with cellulitis and infection. Patient seen in the podiatrist's office yesterday ([name]) and referred for direct admission for IV antibiotics and possible extension of amputation due to osteomyelitis. Wound type: Surgical, infectious Pressure Injury POA: N/A Measurement:  incision with dehiscence, edema, erythema and warmth Wound bed: red and yellow Drainage (amount, consistency, odor) small to moderate Periwound:As described above Dressing procedure/placement/frequency: Will provide conservative orders for nursing until definitive POC determined using saline cleanse and placement of an antimicrobial nonadherent dressing (xeroform).  [place] nursing team will not follow, but will remain available to this patient, the nursing and medical teams.  Please re-consult if needed. Thanks, Maudie Flakes, MSN, RN, [place], Arther Abbott  Pager# [phone]

## 2019-10-12 NOTE — Anesthesia Preprocedure Evaluation (Deleted)
Anesthesia Evaluation    Reviewed: Allergy & Precautions, Patient's Chart, lab work & pertinent test results  History of Anesthesia Complications Negative for: history of anesthetic complications  Airway        Dental   Pulmonary asthma , former smoker,           Cardiovascular hypertension, Pt. on medications + Peripheral Vascular Disease     '15 TTE - mild LVH. EF 55% to 60%. Grade 2 diastolic dysfunction. Trivial MR. RV systolic function was mildly reduced. Physiologic TR.     Neuro/Psych PSYCHIATRIC DISORDERS Anxiety Depression  Tremor   Neuromuscular disease (neuropathy)    GI/Hepatic PUD, GERD  Medicated,(+)     substance abuse  alcohol use,  Esophageal stricture    Endo/Other  diabetes, Type 2, Oral Hypoglycemic Agents Obesity   Renal/GU negative Renal ROS     Musculoskeletal  (+) Arthritis ,   Abdominal   Peds  Hematology  (+) anemia ,  Chronic thrombocytopenia    Anesthesia Other Findings   Reproductive/Obstetrics                             Anesthesia Physical Anesthesia Plan  ASA: III  Anesthesia Plan: General   Post-op Pain Management:    Induction: Intravenous  PONV Risk Score and Plan: 2 and Treatment may vary due to age or medical condition, Ondansetron and Midazolam  Airway Management Planned: LMA  Additional Equipment: None  Intra-op Plan:   Post-operative Plan: Extubation in OR  Informed Consent:   Plan Discussed with: CRNA and Anesthesiologist  Anesthesia Plan Comments: ( )       Anesthesia Quick Evaluation

## 2019-10-12 NOTE — Progress Notes (Signed)
I was called by hospitalist to see pt about  Left foot osteo and abscess . He has already been posted for surgery by [name]. Will sign off, call if I am needed. [phone]

## 2019-10-12 NOTE — Progress Notes (Signed)
VASCULAR LAB    Patient had [name] 07/22/19. Please see results posted below.   Study Result   LOWER EXTREMITY DOPPLER STUDY  Indications: Great toe broken, cellulitis.  High Risk Factors: Hypertension, hyperlipidemia, Diabetes.  Other Factors: ETOH abuse.  Limitations: Today's exam was limited due to Left toe too swollen for toe cuff.  Comparison Study: No prior study on file for comparison  Performing Technologist: Sharion Dove RVS    Examination Guidelines: A complete evaluation includes at minimum, Doppler waveform signals and systolic blood pressure reading at the level of bilateral brachial, anterior tibial, and posterior tibial arteries, when vessel segments are accessible. Bilateral testing is considered an integral part of a complete examination. Photoelectric Plethysmograph (PPG) waveforms and toe systolic pressure readings are included as required and additional duplex testing as needed. Limited examinations for reoccurring indications may be performed as noted.    [name] Findings: +---------+------------------+-----+---------+--------+ Right    Rt Pressure (mmHg)IndexWaveform Comment  +---------+------------------+-----+---------+--------+ Brachial 147                    triphasic         +---------+------------------+-----+---------+--------+ PTA      176               1.20 biphasic          +---------+------------------+-----+---------+--------+ DP       175               1.19 biphasic          +---------+------------------+-----+---------+--------+ Great Toe108               0.73                   +---------+------------------+-----+---------+--------+  +---------+------------------+-----+---------+----------------------------+ Left     Lt Pressure (mmHg)IndexWaveform Comment                      +---------+------------------+-----+---------+----------------------------+ Brachial 144                     triphasic                             +---------+------------------+-----+---------+----------------------------+ PTA      147               1.00 biphasic                              +---------+------------------+-----+---------+----------------------------+ DP       150               1.02 biphasic                              +---------+------------------+-----+---------+----------------------------+ Great Toe                                Toe too swollen for toe cuff +---------+------------------+-----+---------+----------------------------+  +-------+-----------+--------------+------------+------------+ [name]/TBIToday's ABIToday's TBI   Previous ABIPrevious TBI +-------+-----------+--------------+------------+------------+ Right  1.2        0.73                                   +-------+-----------+--------------+------------+------------+ Left   1.02       cuff too  small                         +-------+-----------+--------------+------------+------------+      Summary: Right: Resting right ankle-brachial index is within normal range. No evidence of significant right lower extremity arterial disease. The right toe-brachial index is normal.  Left: Resting left ankle-brachial index is within normal range. No evidence of significant left lower extremity arterial disease.     *See table(s) above for measurements and observations.     Electronically signed by Servando Snare MD on 07/13/2019 at 12:54:43 PM.        Final     Thank you,  Okechukwu Regnier, RVT 10/12/2019, 3:18 PM

## 2019-10-12 NOTE — Progress Notes (Signed)
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   Per prior note:On 9/15 patient had Open reduction, perc pinning left hallux fracture, incision and drainage left hallux. On 10/14 patient had amputation left hallux.  Cultures grew MSSA On 11/4 had repair of complex dehiscence.  Cultures grew Pseudomonas.  He was started on cefepime on admission MRI of left foot 12/11: Acute osteomyelitis of first metatarsal head and diaphysis with edema and enhancement proximally to the level of first metatarsal base.  Complex rim-enhancing fluid suggestive of 2.6 cm abscess at the distal aspect of first metatarsal base.  Rim-enhancing fluid collection in second MTP joint which is dorsally dislocated suggestive of septic arthritis.  Marrow edema in the second metatarsal and second digit proximal phalanx suggestive of Acute osteomyelitis.  Concern for osteo of the third metatarsal head and findings of diffuse myositis and chronic denervation changes.  Orthopedic surgery was consulted on 12/12.  A & P   Principal Problem:   Acute osteomyelitis of toe of left foot (HCC) Active Problems:   Hypertension   ETOH abuse   Diabetic neuropathy (HCC)   Diabetic infection of left foot (HCC)   Osteomyelitis of left foot Afebrile hemodynamically stable without leukocytosis.  Cultures grew Pseudomonas in November, MSSA in October.  On cefepime from admission.  MRI results as above -Ortho consulted.  Possible surgery in a.m.  Will make n.p.o. after midnight -Continue cefepime  Hypertension -Continue lisinopril -Hold Lasix  Type  2 diabetes HA1C 5.3 -Hold home hypoglycemics -Continue sliding scale  Ethanol abuse -CIWA protocol  Chronic neuropathic pain -Continue home scheduled Neurontin and oxycodone as needed   DVT prophylaxis: SCDs Diet: N.p.o. after midnight, carb control Family Communication: No family at bedside Disposition Plan: Pending clinical improvement  Consultants  orthopedic surgery  Procedures  none  Antibiotics  Cefepime 12/11->      Subjective   Patient seen and examined at bedside no acute distress resting comfortably.  States he has generalized pain but otherwise no complaints.  Objective   Vitals:   10/11/19 1940 10/12/19 0628 10/12/19 0653 10/12/19 1355  BP: (!) 153/77  (!) 158/81 (!) 148/97  Pulse: 80  79 90  Resp: 16  16 16   Temp: 98.9 F (37.2 C)  99.5 F (37.5 C) 98.6 F (37 C)  TempSrc: Oral  Oral Oral  SpO2: 100%  98% 98%  Weight:  121.9 kg    Height:  6\' 2"  (1.88 m)      Intake/Output Summary (Last 24 hours) at 10/12/2019 1557 Last data filed at 10/12/2019 1503 Gross per 24 hour  Intake 580 ml  Output 1325 ml  Net -745 ml   Filed Weights   10/12/19 0628  Weight: 121.9 kg    Examination:  Physical Exam Vitals and nursing note reviewed.  Constitutional:      Appearance: He is obese.  HENT:     Head: Normocephalic and atraumatic.  Eyes:     Conjunctiva/sclera: Conjunctivae normal.  Cardiovascular:     Rate and Rhythm: Normal rate and regular rhythm.  Pulmonary:     Effort:  Pulmonary effort is normal.     Breath sounds: Normal breath sounds.  Abdominal:     General: Abdomen is flat.     Palpations: Abdomen is soft.  Musculoskeletal:     Cervical back: Normal range of motion.     Comments: Left lower extremity with Ace wrap on foot Right lower extremity with dried blood on foot  Neurological:     Mental Status: He is alert. Mental status is at baseline.  Psychiatric:        Mood and Affect: Mood normal.        Behavior: Behavior  normal.     Data Reviewed: I have personally reviewed following labs and imaging studies  CBC: Recent Labs  Lab 10/11/19 2015 10/12/19 0447  WBC 5.6 5.0  NEUTROABS 3.9  --   HGB 9.0* 10.0*  HCT 28.4* 31.3*  MCV 96.9 97.8  PLT 127* 123XX123*   Basic Metabolic Panel: Recent Labs  Lab 10/11/19 2015 10/11/19 2016 10/12/19 0447  NA  --  137 141  K  --  3.9 4.5  CL  --  104 104  CO2  --  26 28  GLUCOSE  --  163* 171*  BUN  --  19 15  CREATININE  --  0.94 0.94  CALCIUM  --  9.0 9.5  MG 2.1  --   --   PHOS 3.2  --   --    GFR: Estimated Creatinine Clearance: 124.7 mL/min (by C-G formula based on SCr of 0.94 mg/dL). Liver Function Tests: Recent Labs  Lab 10/11/19 2016  AST 13*  ALT 12  ALKPHOS 85  BILITOT 0.8  PROT 7.0  ALBUMIN 3.5   No results for input(s): LIPASE, AMYLASE in the last 168 hours. No results for input(s): AMMONIA in the last 168 hours. Coagulation Profile: Recent Labs  Lab 10/11/19 2015  INR 1.0   Cardiac Enzymes: No results for input(s): CKTOTAL, CKMB, CKMBINDEX, TROPONINI in the last 168 hours. BNP (last 3 results) No results for input(s): PROBNP in the last 8760 hours. HbA1C: Recent Labs    10/11/19 2016  HGBA1C 5.3   CBG: Recent Labs  Lab 10/12/19 0008 10/12/19 0447 10/12/19 0731 10/12/19 1146  GLUCAP 128* 160* 165* 169*   Lipid Profile: No results for input(s): CHOL, HDL, LDLCALC, TRIG, CHOLHDL, LDLDIRECT in the last 72 hours. Thyroid Function Tests: No results for input(s): TSH, T4TOTAL, FREET4, T3FREE, THYROIDAB in the last 72 hours. Anemia Panel: No results for input(s): VITAMINB12, FOLATE, FERRITIN, TIBC, IRON, RETICCTPCT in the last 72 hours. Sepsis Labs: No results for input(s): PROCALCITON, LATICACIDVEN in the last 168 hours.  Recent Results (from the past 240 hour(s))  SARS CORONAVIRUS 2 (TAT 6-24 HRS) Nasopharyngeal Nasopharyngeal Swab     Status: None   Collection Time: 10/12/19  1:47 AM   Specimen: Nasopharyngeal  Swab  Result Value Ref Range Status   SARS Coronavirus 2 NEGATIVE NEGATIVE Final    Comment: (NOTE) SARS-CoV-2 target nucleic acids are NOT DETECTED. The SARS-CoV-2 RNA is generally detectable in upper and lower respiratory specimens during the acute phase of infection. Negative results do not preclude SARS-CoV-2 infection, do not rule out co-infections with other pathogens, and should not be used as the sole basis for treatment or other patient management decisions. Negative results must be combined with clinical observations, patient history, and epidemiological information. The expected result is Negative. Fact Sheet for Patients: SugarRoll.be Fact Sheet for Healthcare Providers: https://www.woods-mathews.com/ This test is not yet approved  or cleared by the [place] and  has been authorized for detection and/or diagnosis of SARS-CoV-2 by FDA under an Emergency Use Authorization (EUA). This EUA will remain  in effect (meaning this test can be used) for the duration of the COVID-19 declaration under Section 56 4(b)(1) of the Act, 21 U.S.C. section 360bbb-3(b)(1), unless the authorization is terminated or revoked sooner. Performed at [facility], [place] [address] [place], [place] [address]   Surgical pcr screen     Status: None   Collection Time: 10/12/19  6:30 AM   Specimen: Nasal Mucosa; Nasal Swab  Result Value Ref Range Status   MRSA, PCR NEGATIVE NEGATIVE Final   Staphylococcus aureus NEGATIVE NEGATIVE Final    Comment: (NOTE) The Xpert SA Assay (FDA approved for NASAL specimens in patients 72 years of age and older), is one component of a comprehensive surveillance program. It is not intended to diagnose infection nor to guide or monitor treatment. Performed at [facility], [place] [address], [place], [place] [address]          Radiology Studies: MR FOOT LEFT W WO CONTRAST  Result Date:  10/11/2019 CLINICAL DATA:  Osteomyelitis. Recent amputation of the left great toe EXAM: MRI OF THE LEFT FOREFOOT WITHOUT AND WITH CONTRAST TECHNIQUE: Multiplanar, multisequence MR imaging of the left forefoot was performed both before and after administration of intravenous contrast. CONTRAST:  76mL GADAVIST GADOBUTROL 1 MMOL/ML IV SOLN COMPARISON:  X-ray 10/11/2019 FINDINGS: Bones/Joint/Cartilage Amputation of the left great toe at the first MTP joint. Erosive changes of the first metatarsal head with confluent low T1 signal changes extending approximately 2.5 cm proximally into the diaphysis. There is extensive bone marrow edema and enhancement throughout the first metatarsal extending to the base. Dorsal dislocation at the second MTP joint with marrow edema throughout the second metatarsal and second digit proximal phalanx. Patchy low T1 signal changes in the second metatarsal head (series 4, images 8-10). There is mild patchy marrow edema in the third metatarsal head with subtle low T1 signal change. Remaining osseous structures maintain fatty T1 marrow signal. Ligaments LisFranc ligament intact. Muscles and Tendons Findings of diffuse myositis and chronic denervation changes. No tenosynovial fluid collection. Flexor and extensor tendons of the great toe are discontinuous, likely postsurgical. Soft tissues Rim enhancing fluid collection overlying the distal aspect of the first metatarsal head measuring approximately 2.6 x 0.8 cm (series 9, image 12). Fluid extends along the plantar aspect of the first metatarsal neck. Enhancing fluid collection extends into the intermetatarsal space and second MTP joint concerning for septic arthritis. Extensive soft tissue thickening and edema about the forefoot and amputation site. Soft tissue thickening and metallic susceptibility artifact at the surgical site overlying the great toe. IMPRESSION: 1. Acute osteomyelitis of the first metatarsal head and diaphysis. Bone marrow  edema and enhancement extend proximally to the level of the first metatarsal base. There is complex rim enhancing fluid collection over the distal aspect of the first metatarsal base extending more proximally along its plantar aspect measuring up to 2.6 cm suggestive of abscess. 2. Rim enhancing fluid collection at the second MTP joint, which is dorsally dislocated, suggesting septic arthritis. 3. Marrow edema within the second metatarsal and second digit proximal phalanx where there is low signal changes at the second metatarsal head suggesting acute osteomyelitis. 4. Marrow edema at the third metatarsal head without definite confluent low T1 signal change to suggest acute osteomyelitis. 5. Findings of diffuse myositis and chronic  denervation changes. These results will be called to the ordering clinician or representative by the Radiologist Assistant, and communication documented in the PACS or zVision Dashboard. Electronically Signed   By: Davina Poke M.D.   On: 10/11/2019 22:33        Scheduled Meds: . amitriptyline  100 mg Oral QHS  . atorvastatin  40 mg Oral QPM  . DULoxetine  60 mg Oral Daily  . folic acid  1 mg Oral Daily  . gabapentin  1,200 mg Oral QHS  . gabapentin  900 mg Oral BID WC  . glycopyrrolate  1 mg Oral BID  . insulin aspart  0-9 Units Subcutaneous Q4H  . lisinopril  20 mg Oral Daily  . multivitamin with minerals  1 tablet Oral Daily  . pantoprazole  40 mg Oral BID  . primidone  50 mg Oral BID  . thiamine  100 mg Oral Daily   Or  . thiamine  100 mg Intravenous Daily   Continuous Infusions: . sodium chloride 250 mL (10/12/19 0652)  . ceFEPime (MAXIPIME) IV    . ceFEPime (MAXIPIME) IV 2 g (10/12/19 1535)     LOS: 1 day    Time spent: 25 minutes with over 50% of the time coordinating the patient's care    Harold Hedge, DO [facility] Pager [phone]  If 7PM-7AM, please contact night-coverage [email or web address] Password [facility] 10/12/2019, 3:57 PM

## 2019-10-13 ENCOUNTER — Inpatient Hospital Stay (HOSPITAL_COMMUNITY): Payer: Medicaid Other

## 2019-10-13 ENCOUNTER — Encounter (HOSPITAL_COMMUNITY): Payer: Self-pay | Admitting: Internal Medicine

## 2019-10-13 ENCOUNTER — Inpatient Hospital Stay (HOSPITAL_COMMUNITY): Payer: Medicaid Other | Admitting: Certified Registered"

## 2019-10-13 ENCOUNTER — Encounter (HOSPITAL_COMMUNITY)
Admission: AD | Disposition: A | Discharge status: Home / Self Care | Payer: Self-pay | Source: Ambulatory Visit | Attending: Internal Medicine

## 2019-10-13 DIAGNOSIS — M86672 Other chronic osteomyelitis, left ankle and foot: Secondary | ICD-10-CM | POA: Diagnosis not present

## 2019-10-13 HISTORY — PX: INCISION AND DRAINAGE OF WOUND: SHX1803

## 2019-10-13 LAB — CBC
HCT: 28.9 % — ABNORMAL LOW (ref 39.0–52.0)
Hemoglobin: 9.1 g/dL — ABNORMAL LOW (ref 13.0–17.0)
MCH: 30.4 pg (ref 26.0–34.0)
MCHC: 31.5 g/dL (ref 30.0–36.0)
MCV: 96.7 fL (ref 80.0–100.0)
Platelets: 133 10*3/uL — ABNORMAL LOW (ref 150–400)
RBC: 2.99 MIL/uL — ABNORMAL LOW (ref 4.22–5.81)
RDW: 14.9 % (ref 11.5–15.5)
WBC: 4.9 10*3/uL (ref 4.0–10.5)
nRBC: 0 % (ref 0.0–0.2)

## 2019-10-13 LAB — GLUCOSE, CAPILLARY
Glucose-Capillary: 115 mg/dL — ABNORMAL HIGH (ref 70–99)
Glucose-Capillary: 126 mg/dL — ABNORMAL HIGH (ref 70–99)
Glucose-Capillary: 133 mg/dL — ABNORMAL HIGH (ref 70–99)
Glucose-Capillary: 140 mg/dL — ABNORMAL HIGH (ref 70–99)
Glucose-Capillary: 149 mg/dL — ABNORMAL HIGH (ref 70–99)
Glucose-Capillary: 165 mg/dL — ABNORMAL HIGH (ref 70–99)
Glucose-Capillary: 205 mg/dL — ABNORMAL HIGH (ref 70–99)
Glucose-Capillary: 216 mg/dL — ABNORMAL HIGH (ref 70–99)

## 2019-10-13 LAB — BASIC METABOLIC PANEL
Anion gap: 7 (ref 5–15)
BUN: 16 mg/dL (ref 6–20)
CO2: 27 mmol/L (ref 22–32)
Calcium: 9 mg/dL (ref 8.9–10.3)
Chloride: 102 mmol/L (ref 98–111)
Creatinine, Ser: 0.86 mg/dL (ref 0.61–1.24)
GFR calc Af Amer: >60 mL/min (ref >60)
GFR calc non Af Amer: >60 mL/min (ref >60)
Glucose, Bld: 166 mg/dL — ABNORMAL HIGH (ref 70–99)
Potassium: 4.3 mmol/L (ref 3.5–5.1)
Sodium: 136 mmol/L (ref 135–145)

## 2019-10-13 SURGERY — IRRIGATION AND DEBRIDEMENT WOUND
Anesthesia: Monitor Anesthesia Care | Laterality: Left

## 2019-10-13 MED ORDER — ONDANSETRON HCL 4 MG/2ML IJ SOLN
INTRAMUSCULAR | Status: AC
  Filled 2019-10-13: qty 2

## 2019-10-13 MED ORDER — HYDROMORPHONE HCL 1 MG/ML IJ SOLN: 0.2500 mg | INTRAMUSCULAR | Status: DC | PRN

## 2019-10-13 MED ORDER — ENSURE ENLIVE PO LIQD
237.0000 mL | Freq: Two times a day (BID) | ORAL | Status: DC
  Administered 2019-10-13 – 2019-10-18 (×7): 237 mL via ORAL

## 2019-10-13 MED ORDER — 0.9 % SODIUM CHLORIDE (POUR BTL) OPTIME
TOPICAL | Status: DC | PRN
  Administered 2019-10-13: 1000 mL

## 2019-10-13 MED ORDER — JUVEN PO PACK
1.0000 | PACK | Freq: Two times a day (BID) | ORAL | Status: DC
  Administered 2019-10-13 – 2019-10-18 (×8): 1 via ORAL
  Filled 2019-10-13 (×11): qty 1

## 2019-10-13 MED ORDER — PROPOFOL 10 MG/ML IV BOLUS
INTRAVENOUS | Status: AC
  Filled 2019-10-13: qty 20

## 2019-10-13 MED ORDER — VANCOMYCIN HCL 1000 MG IV SOLR
INTRAVENOUS | Status: AC
  Filled 2019-10-13: qty 1000

## 2019-10-13 MED ORDER — FENTANYL CITRATE (PF) 100 MCG/2ML IJ SOLN
INTRAMUSCULAR | Status: AC
  Filled 2019-10-13: qty 2

## 2019-10-13 MED ORDER — OXYCODONE HCL 5 MG/5ML PO SOLN: 5.0000 mg | Freq: Once | ORAL | Status: DC | PRN

## 2019-10-13 MED ORDER — LIDOCAINE 2% (20 MG/ML) 5 ML SYRINGE
INTRAMUSCULAR | Status: AC
  Filled 2019-10-13: qty 5

## 2019-10-13 MED ORDER — BUPIVACAINE HCL (PF) 0.5 % IJ SOLN
INTRAMUSCULAR | Status: AC
  Filled 2019-10-13: qty 30

## 2019-10-13 MED ORDER — FENTANYL CITRATE (PF) 100 MCG/2ML IJ SOLN
INTRAMUSCULAR | Status: DC | PRN
  Administered 2019-10-13 (×2): 50 ug via INTRAVENOUS

## 2019-10-13 MED ORDER — PROMETHAZINE HCL 25 MG/ML IJ SOLN: 6.2500 mg | INTRAMUSCULAR | Status: DC | PRN

## 2019-10-13 MED ORDER — PROPOFOL 500 MG/50ML IV EMUL
INTRAVENOUS | Status: DC | PRN
  Administered 2019-10-13: 50 ug/kg/min via INTRAVENOUS

## 2019-10-13 MED ORDER — KETOROLAC TROMETHAMINE 30 MG/ML IJ SOLN: 30.0000 mg | Freq: Once | INTRAMUSCULAR | Status: DC | PRN

## 2019-10-13 MED ORDER — LIDOCAINE 2% (20 MG/ML) 5 ML SYRINGE
INTRAMUSCULAR | Status: DC | PRN
  Administered 2019-10-13: 50 mg via INTRAVENOUS

## 2019-10-13 MED ORDER — VANCOMYCIN HCL 1000 MG IV SOLR
INTRAVENOUS | Status: DC | PRN
  Administered 2019-10-13: 1000 mg via TOPICAL

## 2019-10-13 MED ORDER — ONDANSETRON HCL 4 MG/2ML IJ SOLN
INTRAMUSCULAR | Status: DC | PRN
  Administered 2019-10-13: 4 mg via INTRAVENOUS

## 2019-10-13 MED ORDER — BUPIVACAINE HCL (PF) 0.5 % IJ SOLN
INTRAMUSCULAR | Status: DC | PRN
  Administered 2019-10-13: 10 mL

## 2019-10-13 MED ORDER — OXYCODONE HCL 5 MG PO TABS: 5.0000 mg | ORAL_TABLET | Freq: Once | ORAL | Status: DC | PRN

## 2019-10-13 MED ORDER — MIDAZOLAM HCL 2 MG/2ML IJ SOLN
INTRAMUSCULAR | Status: AC
  Filled 2019-10-13: qty 2

## 2019-10-13 SURGICAL SUPPLY — 47 items
APL PRP STRL LF DISP 70% ISPRP (MISCELLANEOUS) ×1
BLADE HEX COATED 2.75 (ELECTRODE) ×2 IMPLANT
BLADE OSCILLATING/SAGITTAL (BLADE) ×2
BLADE SURG 15 STRL LF DISP TIS (BLADE) ×1 IMPLANT
BLADE SURG 15 STRL SS (BLADE) ×2
BLADE SW THK.38XMED LNG THN (BLADE) IMPLANT
BNDG CMPR 9X4 STRL LF SNTH (GAUZE/BANDAGES/DRESSINGS) ×1
BNDG COHESIVE 4X5 TAN STRL (GAUZE/BANDAGES/DRESSINGS) IMPLANT
BNDG ELASTIC 3X5.8 VLCR STR LF (GAUZE/BANDAGES/DRESSINGS) ×2 IMPLANT
BNDG ELASTIC 4X5.8 VLCR STR LF (GAUZE/BANDAGES/DRESSINGS) ×1 IMPLANT
BNDG ESMARK 4X9 LF (GAUZE/BANDAGES/DRESSINGS) ×2 IMPLANT
BNDG GAUZE ELAST 4 BULKY (GAUZE/BANDAGES/DRESSINGS) ×2 IMPLANT
CHLORAPREP W/TINT 26 (MISCELLANEOUS) ×2 IMPLANT
CONT SPEC 4OZ CLIKSEAL STRL BL (MISCELLANEOUS) ×2 IMPLANT
COVER BACK TABLE 60X90IN (DRAPES) ×2 IMPLANT
CUFF TOURN SGL QUICK 18X4 (TOURNIQUET CUFF) ×2 IMPLANT
DRAPE POUCH INSTRU U-SHP 10X18 (DRAPES) ×2 IMPLANT
DRAPE SHEET LG 3/4 BI-LAMINATE (DRAPES) ×4 IMPLANT
DRSG PAD ABDOMINAL 8X10 ST (GAUZE/BANDAGES/DRESSINGS) ×1 IMPLANT
GAUZE PACKING 1 X5 YD ST (GAUZE/BANDAGES/DRESSINGS) ×1 IMPLANT
GAUZE PACKING IODOFORM 1X5 (MISCELLANEOUS) ×1 IMPLANT
GAUZE SPONGE 4X4 12PLY STRL (GAUZE/BANDAGES/DRESSINGS) ×2 IMPLANT
GAUZE XEROFORM 1X8 LF (GAUZE/BANDAGES/DRESSINGS) IMPLANT
GLOVE BIO SURGEON STRL SZ7.5 (GLOVE) ×2 IMPLANT
GLOVE BIOGEL PI IND STRL 8 (GLOVE) ×1 IMPLANT
GLOVE BIOGEL PI INDICATOR 8 (GLOVE) ×1
GOWN STRL REUS W/TWL XL LVL3 (GOWN DISPOSABLE) ×4 IMPLANT
KIT BASIN OR (CUSTOM PROCEDURE TRAY) ×2 IMPLANT
KIT TURNOVER KIT A (KITS) IMPLANT
NDL HYPO 25X1 1.5 SAFETY (NEEDLE) ×1 IMPLANT
NEEDLE HYPO 25X1 1.5 SAFETY (NEEDLE) ×2 IMPLANT
PENCIL SMOKE EVACUATOR (MISCELLANEOUS) IMPLANT
SET IRRIG Y TYPE TUR BLADDER L (SET/KITS/TRAYS/PACK) IMPLANT
STAPLER VISISTAT 35W (STAPLE) ×2 IMPLANT
STOCKINETTE 6  STRL (DRAPES) ×1
STOCKINETTE 6 STRL (DRAPES) ×1 IMPLANT
SUT ETHILON 2 0 PS N (SUTURE) ×1 IMPLANT
SUT ETHILON 2 0 PSLX (SUTURE) ×1 IMPLANT
SUT ETHILON 4 0 PS 2 18 (SUTURE) IMPLANT
SUT MNCRL AB 3-0 PS2 18 (SUTURE) IMPLANT
SUT MON AB 5-0 PS2 18 (SUTURE) IMPLANT
SUT VIC AB 3-0 PS2 18 (SUTURE)
SUT VIC AB 3-0 PS2 18XBRD (SUTURE) IMPLANT
SUT VICRYL 4-0 PS2 18IN ABS (SUTURE) ×2 IMPLANT
SYR CONTROL 10ML LL (SYRINGE) ×2 IMPLANT
TOWEL OR 17X26 10 PK STRL BLUE (TOWEL DISPOSABLE) ×1 IMPLANT
YANKAUER SUCT BULB TIP 10FT TU (MISCELLANEOUS) ×2 IMPLANT

## 2019-10-13 NOTE — TOC Initial Note (Signed)
Transition of Care [facility]) - Initial/Assessment Note    Patient Details  Name: Eugene Diaz MRN: [id number] Date of Birth: May 06, 1966  Transition of Care (TOC) CM/SW Contact:    Joaquin Courts, RN Phone Number: 10/13/2019, 2:12 PM  Clinical Narrative:                 [facility] consulted for medication assistance/ HH and DME needs. CM noted consult, patient has medicaid which covers the cost of his medications. CM noted no PT/OT eval orders in place, will follow for recommendations once consults are in place and patient has been evaluated.         Patient Goals and CMS Choice        Expected Discharge Plan and Services                                                Prior Living Arrangements/Services                       Activities of Daily Living Home Assistive Devices/Equipment: Cane (specify quad or straight) ADL Screening (condition at time of admission) Patient's cognitive ability adequate to safely complete daily activities?: Yes Is the patient deaf or have difficulty hearing?: No Does the patient have difficulty seeing, even when wearing glasses/contacts?: No Does the patient have difficulty concentrating, remembering, or making decisions?: No Patient able to express need for assistance with ADLs?: Yes Does the patient have difficulty dressing or bathing?: No Independently performs ADLs?: Yes (appropriate for developmental age) Does the patient have difficulty walking or climbing stairs?: Yes Weakness of Legs: Left Weakness of Arms/Hands: None  Permission Sought/Granted                  Emotional Assessment              Admission diagnosis:  Osteomyelitis of toe of left foot ([place]) [M86.9] Patient Active Problem List   Diagnosis Date Noted  . Wound dehiscence 08/31/2019  . Osteomyelitis due to type 2 diabetes mellitus ([place]) 08/14/2019  . Acute osteomyelitis of toe of left foot ([place]) 08/14/2019  . Sepsis ([place]) 08/14/2019  .  Hyponatremia 08/14/2019  . Normocytic anemia 08/14/2019  . B12 deficiency 08/14/2019  . Acute osteomyelitis of left ankle or foot ([place])   . Cellulitis of toe, left   . Diabetic infection of left foot ([place])   . Toe fracture, left 07/13/2019  . Cellulitis 07/12/2019  . GERD (gastroesophageal reflux disease) 12/19/2018  . Hepatic steatosis 06/18/2018  . Gastritis, erosive   . Dysphagia, idiopathic 12/27/2017  . Rectal bleeding 12/27/2017  . Intention tremor 06/03/2016  . Left knee pain 12/04/2015  . Alcoholic peripheral neuropathy ([place]) 10/08/2014  . Diabetic neuropathy ([place]) 10/08/2014  . Chest pain, rule out acute myocardial infarction 03/10/2014  . Thrombocytopenia ([place]) 03/10/2014  . ETOH abuse 03/10/2014  . Chest pain 03/10/2014  . Diabetes mellitus without complication ([place])   . Hypertension   . Hypercholesterolemia    PCP:  [name], PA-C Pharmacy:   [place], [place] [id number] [place] [id number] Phone: [phone] Fax: [phone]     Social Determinants of Health (SDOH) Interventions    Readmission Risk Interventions No flowsheet data found.

## 2019-10-13 NOTE — Op Note (Signed)
Patient Name: Eugene Diaz DOB: 05-14-1966  MRN: [id number]   Date of Service: 10/13/2019  Surgeon: Dr. Hardie Pulley, DPM Assistants: None Pre-operative Diagnosis:  Abscess left foot, osteomyeltiis Post-operative Diagnosis:  Same Procedures:  1) Left Foot Incision and Drainage below the deep fascia  2) Left Partial 1st Ray amputation Pathology/Specimens: ID Type Source Tests Collected by Time Destination  1 : Left first metatarsal bone Tissue PATH Digit amputation SURGICAL PATHOLOGY Evelina Bucy, DPM 10/13/2019 1009   A : Left foot swab culture Tissue PATH Amputaion Arm/Leg FUNGUS CULTURE WITH STAIN, AEROBIC/ANAEROBIC CULTURE (SURGICAL/DEEP WOUND), FUNGUS STAIN Evelina Bucy, DPM 10/13/2019 1004   B : Left first Metatarsal Bone Tissue PATH Digit amputation FUNGUS CULTURE WITH STAIN, AEROBIC/ANAEROBIC CULTURE (SURGICAL/DEEP WOUND), FUNGUS STAIN (Canceled) Evelina Bucy, DPM 10/13/2019 1007    Anesthesia: MAC/local with 10 ccs 0.5% marcaine plain Hemostasis:  Total Tourniquet Time Documented: Calf (Left) - 24 minutes Total: Calf (Left) - 24 minutes  Estimated Blood Loss: 25 ml Materials: * No implants in log * Medications: 1g Vancomycin powder topically Complications: none  Indications for Procedure:  This is a 53 y.o. male recently underwent first toe amputation of the left foot for infection.  He is experienced continued infection during the postoperative period and MRI showed abscess formation with osteomyelitis of the first metatarsal head with possible septic arthritis of the second MPJ.  It was discussed the patient would benefit from surgical intervention for these conditions.  All risk benefits terms of surgery discussed with patient guarantees were given.  Of note today's metatarsal amputation is more extensive than the previous hallux amputation that he experienced.   Procedure in Detail: Patient was identified in pre-operative holding area. Formal consent was  signed and the left lower extremity was marked. Patient was brought back to the operating room. Anesthesia was induced. The extremity was prepped and draped in the usual sterile fashion. Timeout was taken to confirm patient name, laterality, and procedure prior to incision.   Attention was then directed to the left plantar second metatarsal where there was purulence noted.  This was collected for swab culture.  The swab was also used to culture the area around the first metatarsal wound.  The hyperkeratotic tissue around the second MPJ was sharply excisionally debrided and incision was made over the area deep to the deep fascia.  The area is bluntly explored and no additional deep purulence was noted.  The wound did probe to second metatarsal bone however the bone was hard and firm. Debridement was performed to the fascial layer at this location.  The area over the previous first metatarsal incision was incised incision carried down bluntly to bone.  There was a large serosanguineous fluid collection without frank purulence.  Thoroughly evacuated.  The first metatarsal head appeared quite soft.  This was resected with sagittal saw to firm bone.  The initial cut was collected for pathology and a subsequent piece of bone was collected for microbiology this for a clean margin.    Both wounds were then thoroughly irrigated with pulse lavage.  The wound was then packed with vancomycin powder and loosely approximated with 2-0 nylon.  The wound was further packed with iodoform packing.  The wound was then dressed with 4 x 4 ABD, Kerlix Ace bandage. Patient tolerated the procedure well.   Disposition: Following a period of post-operative monitoring, patient will be transferred back to the floor.  He will benefit from return to the operating room for wound  closure possible partial second ray amputation pending clinical progress

## 2019-10-13 NOTE — Progress Notes (Signed)
Initial Nutrition Assessment  DOCUMENTATION CODES:   Obesity unspecified  INTERVENTION:   Once diet advanced: -Ensure Enlive po BID, each supplement provides 350 kcal and 20 grams of protein -Juven Fruit Punch BID, each serving provides 95kcal and 2.5g of protein (amino acids glutamine and arginine)  NUTRITION DIAGNOSIS:   Increased nutrient needs related to post-op healing, wound healing as evidenced by estimated needs.  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  **RD working remotely**  Per chart review, patient is currently in OR for I&D of abscess with first metatarsal resection. Pt NPO. Pt did eat 100% of lunch yesterday. Pt would benefit from protein supplements to aid in wound healing. Once diet advanced, would order Ensure and Juven supplements.  Per weight records, pt has lost 18 lbs since 8/14 (6% wt loss x 4 months, insignificant for time frame).   I/Os: -62 ml since admit UOP: 1.2L x 24 hrs  Medications: Folic acid tablet, Multivitamin with minerals daily, Thiamine tablet Labs reviewed: CBGs: 133-140  NUTRITION - FOCUSED PHYSICAL EXAM:  Working remotely.  Diet Order:   Diet Order            Diet NPO time specified  Diet effective midnight              EDUCATION NEEDS:   Not appropriate for education at this time  Skin:  Skin Assessment: Skin Integrity Issues: Skin Integrity Issues:: Other (Comment) Other: Left foot osteomyelitis and abscess  Last BM:  PTA  Height:   Ht Readings from Last 1 Encounters:  10/12/19 6'  2" (1.88 m)    Weight:   Wt Readings from Last 1 Encounters:  10/12/19 121.9 kg    Ideal Body Weight:  86.3 kg  BMI:  Body mass index is 34.5 kg/m.  Estimated Nutritional Needs:   Kcal:  2200-2400  Protein:  105-115g  Fluid:  2L/day  Clayton Bibles, MS, RD, LDN Inpatient Clinical Dietitian Pager: [phone] After Hours Pager: [phone]

## 2019-10-13 NOTE — Progress Notes (Signed)
  Subjective:  Patient ID: MOHNISH MCGLYNN, male    DOB: [birth year],  MRN: [id number]  Seen in pre-op. Last ate at 6pm yesterday. Objective:   Vitals:   10/13/19 0850 10/13/19 0852  BP:  132/76  Pulse: 71 71  Resp: 18 16  Temp: 98.5 F (36.9 C) 98.5 F (36.9 C)  SpO2: 99% 99%   General AA&O x3. Normal mood and affect.  Vascular Foot warm and well perfused intact capillary refill to the digits.  Neurologic Epicritic sensation grossly absent.  Dermatologic Dressing clean dry and intact.  Orthopedic: Left hallux amputation noted.   Assessment & Plan:  Patient was evaluated and treated and all questions answered.  Left Foot Osteomyelitis, abscess -To OR today for I&D, 1st metatarsal amputation, application of wound VAC -MRI reviewed. Will perform 2nd metatarsal arthrotomy if indicated given extension of the wound out of concern for septic arthritis, however the dislocation seen is likely 2/2 progressive hammertoe formation. -Will continue to follow post-operatively.  Evelina Bucy, DPM  Accessible via secure chat for questions or concerns.

## 2019-10-13 NOTE — Plan of Care (Signed)
  Problem: Education: Goal: Knowledge of General Education information will improve Description: Including pain rating scale, medication(s)/side effects and non-pharmacologic comfort measures Outcome: Progressing   Problem: Health Behavior/Discharge Planning: Goal: Ability to manage health-related needs will improve Outcome: Progressing   Problem: Clinical Measurements: Goal: Will remain free from infection Outcome: Progressing   Problem: Nutrition: Goal: Adequate nutrition will be maintained Outcome: Progressing   Problem: Elimination: Goal: Will not experience complications related to bowel motility Outcome: Progressing   Problem: Pain Managment: Goal: General experience of comfort will improve Outcome: Progressing

## 2019-10-13 NOTE — Progress Notes (Signed)
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   Per prior note:On 9/15 patient had Open reduction, perc pinning left hallux fracture, incision and drainage left hallux. On 10/14 patient had amputation left hallux.  Cultures grew MSSA On 11/4 had repair of complex dehiscence.  Cultures grew Pseudomonas.  He was started on cefepime on admission MRI of left foot 12/11: Acute osteomyelitis of first metatarsal head and diaphysis with edema and enhancement proximally to the level of first metatarsal base.  Complex rim-enhancing fluid suggestive of 2.6 cm abscess at the distal aspect of first metatarsal base.  Rim-enhancing fluid collection in second MTP joint which is dorsally dislocated suggestive of septic arthritis.  Marrow edema in the second metatarsal and second digit proximal phalanx suggestive of Acute osteomyelitis.  Concern for osteo of the third metatarsal head and findings of diffuse myositis and chronic denervation changes.  Orthopedic surgery was consulted on 12/12 however podiatry apparently already had patient on their schedule for surgery. Surgery 12/13.  A & P   Principal Problem:   Acute osteomyelitis of toe of left foot (HCC) Active Problems:   Hypertension   ETOH abuse   Diabetic neuropathy (HCC)   Diabetic infection of left foot (HCC)   Osteomyelitis of left foot Afebrile hemodynamically stable without leukocytosis.  Cultures grew Pseudomonas in November, MSSA in October.  On cefepime from admission.  MRI results as above -Podiatry: plan for I&D today of abscess with first metatarsal  resection and will likely have to leave the wound open and return later to close it WBAT to LLE in CAM boot -Continue cefepime  Hypertension -Continue lisinopril -Hold Lasix  Type 2 diabetes HA1C 5.3 -Hold home hypoglycemics -Continue sliding scale  Ethanol abuse -CIWA protocol  Chronic neuropathic pain -Continue home scheduled Neurontin and oxycodone as needed   DVT prophylaxis: SCDs Diet: N.p.o. Family Communication: No family at bedside Disposition Plan: Pending clinical improvement  Consultants  Orthopedic surgery consulted 12/12 however patient was already on schedule for surgery with podiatry. Signed off. Podiatry surgery  Procedures  none  Antibiotics  Cefepime 12/11->      Subjective   Patient seen and examined at bedside no acute distress and resting comfortably.  No events overnight. Thirsty.  Denies any chest pain, shortness of breath, fever, nausea, vomiting, urinary complaints.  Otherwise ROS negative   Objective   Vitals:   10/12/19 2133 10/13/19 0449 10/13/19 0850 10/13/19 0852  BP: (!) 145/77 129/61  132/76  Pulse: 77 77 71 71  Resp: 16 14 18 16   Temp: 98.5 F (36.9 C) 98.3 F (36.8 C) 98.5 F (36.9 C) 98.5 F (36.9 C)  TempSrc: Oral Oral Oral Oral  SpO2: 98% 97% 99% 99%  Weight:      Height:        Intake/Output Summary (Last 24 hours) at 10/13/2019 0903 Last data filed at 10/13/2019 0600 Gross per 24 hour  Intake 1112.72 ml  Output 650 ml  Net 462.72 ml   Filed Weights   10/12/19 0628  Weight: 121.9 kg    Examination:  Physical Exam Vitals  and nursing note reviewed.  Constitutional:      Appearance: He is obese.  HENT:     Head: Normocephalic and atraumatic.     Nose: Nose normal.     Mouth/Throat:     Mouth: Mucous membranes are moist.  Eyes:     Extraocular Movements: Extraocular movements intact.  Cardiovascular:     Rate and Rhythm: Normal rate and regular rhythm.  Pulmonary:     Effort: Pulmonary effort  is normal.     Breath sounds: Normal breath sounds.  Abdominal:     General: Abdomen is flat.     Palpations: Abdomen is soft.  Musculoskeletal:     Cervical back: Normal range of motion. No rigidity.     Comments: LLE with ace wrap RLE with dried blood   Neurological:     General: No focal deficit present.     Mental Status: He is alert. Mental status is at baseline.  Psychiatric:        Mood and Affect: Mood normal.        Behavior: Behavior normal.     Data Reviewed: I have personally reviewed following labs and imaging studies  CBC: Recent Labs  Lab 10/11/19 2015 10/12/19 0447 10/13/19 0452  WBC 5.6 5.0 4.9  NEUTROABS 3.9  --   --   HGB 9.0* 10.0* 9.1*  HCT 28.4* 31.3* 28.9*  MCV 96.9 97.8 96.7  PLT 127* 121* Q000111Q*   Basic Metabolic Panel: Recent Labs  Lab 10/11/19 2015 10/11/19 2016 10/12/19 0447 10/13/19 0452  NA  --  137 141 136  K  --  3.9 4.5 4.3  CL  --  104 104 102  CO2  --  26 28 27   GLUCOSE  --  163* 171* 166*  BUN  --  19 15 16   CREATININE  --  0.94 0.94 0.86  CALCIUM  --  9.0 9.5 9.0  MG 2.1  --   --   --   PHOS 3.2  --   --   --    GFR: Estimated Creatinine Clearance: 136.3 mL/min (by C-G formula based on SCr of 0.86 mg/dL). Liver Function Tests: Recent Labs  Lab 10/11/19 2016  AST 13*  ALT 12  ALKPHOS 85  BILITOT 0.8  PROT 7.0  ALBUMIN 3.5   No results for input(s): LIPASE, AMYLASE in the last 168 hours. No results for input(s): AMMONIA in the last 168 hours. Coagulation Profile: Recent Labs  Lab 10/11/19 2015  INR 1.0   Cardiac Enzymes: No results for input(s): CKTOTAL, CKMB, CKMBINDEX, TROPONINI in the last 168 hours. BNP (last 3 results) No results for input(s): PROBNP in the last 8760 hours. HbA1C: Recent Labs    10/11/19 2016  HGBA1C 5.3   CBG: Recent Labs  Lab 10/12/19 1637 10/12/19 2138 10/13/19 0003 10/13/19 0446 10/13/19 0812  GLUCAP 153* 242* 205* 149* 133*   Lipid Profile: No results for input(s):  CHOL, HDL, LDLCALC, TRIG, CHOLHDL, LDLDIRECT in the last 72 hours. Thyroid Function Tests: No results for input(s): TSH, T4TOTAL, FREET4, T3FREE, THYROIDAB in the last 72 hours. Anemia Panel: No results for input(s): VITAMINB12, FOLATE, FERRITIN, TIBC, IRON, RETICCTPCT in the last 72 hours. Sepsis Labs: No results for input(s): PROCALCITON, LATICACIDVEN in the last 168 hours.  Recent Results (from the past 240 hour(s))  Blood Cultures x 2 sites     Status: None (Preliminary result)   Collection Time: 10/11/19 10:17 PM   Specimen: BLOOD LEFT HAND  Result Value Ref Range Status   Specimen Description   Final    BLOOD LEFT HAND Performed at [facility] [address], [place], [place] [address]    Special Requests   Final    BOTTLES DRAWN AEROBIC ONLY Blood Culture adequate volume Performed at [facility] [address], [place], [place] [address]    Culture   Final    NO GROWTH 1 DAY Performed at [facility], [place] [address] [place], [place] [address]    Report Status PENDING  Incomplete  Blood Cultures x 2 sites     Status: None (Preliminary result)   Collection Time: 10/11/19 10:17 PM   Specimen: BLOOD  Result Value Ref Range Status   Specimen Description   Final    BLOOD LEFT ANTECUBITAL Performed at [facility] [address] [place], [place] [address]    Special Requests   Final    BOTTLES DRAWN AEROBIC ONLY Blood Culture adequate volume Performed at [facility] [address], [place], [place] [address]    Culture   Final    NO GROWTH 1 DAY Performed at [facility], [place] [address], [place], [place] [address]    Report Status PENDING  Incomplete  SARS CORONAVIRUS 2 (TAT 6-24 HRS) Nasopharyngeal Nasopharyngeal Swab     Status: None   Collection Time: 10/12/19  1:47 AM   Specimen: Nasopharyngeal Swab  Result Value Ref Range Status   SARS Coronavirus 2 NEGATIVE NEGATIVE  Final    Comment: (NOTE) SARS-CoV-2 target nucleic acids are NOT DETECTED. The SARS-CoV-2 RNA is generally detectable in upper and lower respiratory specimens during the acute phase of infection. Negative results do not preclude SARS-CoV-2 infection, do not rule out co-infections with other pathogens, and should not be used as the sole basis for treatment or other patient management decisions. Negative results must be combined with clinical observations, patient history, and epidemiological information. The expected result is Negative. Fact Sheet for Patients: SugarRoll.be Fact Sheet for Healthcare Providers: https://www.woods-mathews.com/ This test is not yet approved or cleared by the [place] FDA and  has been authorized for detection and/or diagnosis of SARS-CoV-2 by FDA under an Emergency Use Authorization (EUA). This EUA will remain  in effect (meaning this test can be used) for the duration of the COVID-19 declaration under Section 56 4(b)(1) of the Act, 21 U.S.C. section 360bbb-3(b)(1), unless the authorization is terminated or revoked sooner. Performed at [facility], [place] [address], [place], [place] [address]   Surgical pcr screen     Status: None   Collection Time: 10/12/19  6:30 AM   Specimen: Nasal Mucosa; Nasal Swab  Result Value Ref Range Status   MRSA, PCR NEGATIVE NEGATIVE Final   Staphylococcus aureus NEGATIVE NEGATIVE Final    Comment: (NOTE) The Xpert SA Assay (FDA approved for NASAL specimens in patients 67 years of age and older), is one component of a comprehensive surveillance program. It is not intended to diagnose infection nor to guide or monitor treatment. Performed at [facility], [place] [address], [place], [place] [address]          Radiology Studies: MR FOOT LEFT W WO CONTRAST  Result Date: 10/11/2019 CLINICAL DATA:  Osteomyelitis. Recent amputation of the left great  toe EXAM: MRI OF THE LEFT FOREFOOT WITHOUT AND WITH CONTRAST TECHNIQUE: Multiplanar, multisequence MR imaging of the left forefoot was performed both before and after  administration of intravenous contrast. CONTRAST:  74mL GADAVIST GADOBUTROL 1 MMOL/ML IV SOLN COMPARISON:  X-ray 10/11/2019 FINDINGS: Bones/Joint/Cartilage Amputation of the left great toe at the first MTP joint. Erosive changes of the first metatarsal head with confluent low T1 signal changes extending approximately 2.5 cm proximally into the diaphysis. There is extensive bone marrow edema and enhancement throughout the first metatarsal extending to the base. Dorsal dislocation at the second MTP joint with marrow edema throughout the second metatarsal and second digit proximal phalanx. Patchy low T1 signal changes in the second metatarsal head (series 4, images 8-10). There is mild patchy marrow edema in the third metatarsal head with subtle low T1 signal change. Remaining osseous structures maintain fatty T1 marrow signal. Ligaments LisFranc ligament intact. Muscles and Tendons Findings of diffuse myositis and chronic denervation changes. No tenosynovial fluid collection. Flexor and extensor tendons of the great toe are discontinuous, likely postsurgical. Soft tissues Rim enhancing fluid collection overlying the distal aspect of the first metatarsal head measuring approximately 2.6 x 0.8 cm (series 9, image 12). Fluid extends along the plantar aspect of the first metatarsal neck. Enhancing fluid collection extends into the intermetatarsal space and second MTP joint concerning for septic arthritis. Extensive soft tissue thickening and edema about the forefoot and amputation site. Soft tissue thickening and metallic susceptibility artifact at the surgical site overlying the great toe. IMPRESSION: 1. Acute osteomyelitis of the first metatarsal head and diaphysis. Bone marrow edema and enhancement extend proximally to the level of the first metatarsal  base. There is complex rim enhancing fluid collection over the distal aspect of the first metatarsal base extending more proximally along its plantar aspect measuring up to 2.6 cm suggestive of abscess. 2. Rim enhancing fluid collection at the second MTP joint, which is dorsally dislocated, suggesting septic arthritis. 3. Marrow edema within the second metatarsal and second digit proximal phalanx where there is low signal changes at the second metatarsal head suggesting acute osteomyelitis. 4. Marrow edema at the third metatarsal head without definite confluent low T1 signal change to suggest acute osteomyelitis. 5. Findings of diffuse myositis and chronic denervation changes. These results will be called to the ordering clinician or representative by the Radiologist Assistant, and communication documented in the PACS or zVision Dashboard. Electronically Signed   By: Davina Poke M.D.   On: 10/11/2019 22:33        Scheduled Meds: . amitriptyline  100 mg Oral QHS  . atorvastatin  40 mg Oral QPM  . DULoxetine  60 mg Oral Daily  . folic acid  1 mg Oral Daily  . gabapentin  1,200 mg Oral QHS  . gabapentin  900 mg Oral BID WC  . glycopyrrolate  1 mg Oral BID  . insulin aspart  0-9 Units Subcutaneous Q4H  . lisinopril  20 mg Oral Daily  . multivitamin with minerals  1 tablet Oral Daily  . pantoprazole  40 mg Oral BID  . primidone  50 mg Oral BID  . thiamine  100 mg Oral Daily   Or  . thiamine  100 mg Intravenous Daily   Continuous Infusions: . sodium chloride 1,000 mL (10/13/19 0520)  . ceFEPime (MAXIPIME) IV    . ceFEPime (MAXIPIME) IV 2 g (10/13/19 0514)     LOS: 2 days    Time spent: 20 minutes with over 50% of the time coordinating the patient's care    Harold Hedge, DO [facility] Pager [phone]  If 7PM-7AM, please contact night-coverage [email or web address] Password  [email or web address] 10/13/2019, 9:03 AM

## 2019-10-13 NOTE — Anesthesia Preprocedure Evaluation (Addendum)
Anesthesia Evaluation  Patient identified by MRN, date of birth, ID band Patient awake    Reviewed: Allergy & Precautions, NPO status   Airway Mallampati: II  TM Distance: >3 FB     Dental   Pulmonary asthma , former smoker,    breath sounds clear to auscultation       Cardiovascular hypertension, + Peripheral Vascular Disease   Rhythm:Regular Rate:Normal     Neuro/Psych PSYCHIATRIC DISORDERS Anxiety Depression  Neuromuscular disease    GI/Hepatic Neg liver ROS, PUD, GERD  ,  Endo/Other  diabetes  Renal/GU negative Renal ROS     Musculoskeletal  (+) Arthritis ,   Abdominal   Peds  Hematology  (+) anemia ,   Anesthesia Other Findings   Reproductive/Obstetrics                            Anesthesia Physical Anesthesia Plan  ASA: III  Anesthesia Plan: MAC   Post-op Pain Management:    Induction: Intravenous  PONV Risk Score and Plan: 2 and Ondansetron, Dexamethasone and Midazolam  Airway Management Planned: Nasal Cannula and Simple Face Mask  Additional Equipment:   Intra-op Plan:   Post-operative Plan:   Informed Consent: I have reviewed the patients History and Physical, chart, labs and discussed the procedure including the risks, benefits and alternatives for the proposed anesthesia with the patient or authorized representative who has indicated his/her understanding and acceptance.     Dental advisory given  Plan Discussed with: CRNA and Anesthesiologist  Anesthesia Plan Comments:         Anesthesia Quick Evaluation

## 2019-10-13 NOTE — Anesthesia Procedure Notes (Signed)
Procedure Name: MAC Performed by: Shey Yott D, CRNA Oxygen Delivery Method: Simple face mask       

## 2019-10-13 NOTE — Transfer of Care (Signed)
Immediate Anesthesia Transfer of Care Note  Patient: Eugene Diaz  Procedure(s) Performed: IRRIGATION AND DEBRIDEMENT WOUND OF LEFT FOOT AND FIRST METATARSAL RESECTION (Left )  Patient Location: PACU  Anesthesia Type:General  Level of Consciousness: awake, alert  and oriented  Airway & Oxygen Therapy: Patient Spontanous Breathing and Patient connected to face mask oxygen  Post-op Assessment: Report given to RN and Post -op Vital signs reviewed and stable  Post vital signs: Reviewed and stable  Last Vitals:  Vitals Value Taken Time  BP    Temp    Pulse    Resp    SpO2      Last Pain:  Vitals:   10/13/19 0852  TempSrc: Oral  PainSc:       Patients Stated Pain Goal: 3 ([id number])  Complications: No apparent anesthesia complications

## 2019-10-13 NOTE — Anesthesia Postprocedure Evaluation (Signed)
Anesthesia Post Note  Patient: Eugene Diaz  Procedure(s) Performed: IRRIGATION AND DEBRIDEMENT WOUND OF LEFT FOOT AND FIRST METATARSAL RESECTION (Left )     Patient location during evaluation: PACU Anesthesia Type: MAC Level of consciousness: awake Pain management: pain level controlled Vital Signs Assessment: post-procedure vital signs reviewed and stable Respiratory status: spontaneous breathing Cardiovascular status: stable Postop Assessment: no apparent nausea or vomiting Anesthetic complications: no    Last Vitals:  Vitals:   10/13/19 1130 10/13/19 1145  BP: (!) 156/79 132/73  Pulse: (!) 57 62  Resp: 18 18  Temp: 36.4 C 36.5 C  SpO2: 100% 100%    Last Pain:  Vitals:   10/13/19 1145  TempSrc: Oral  PainSc: 0-No pain                 Verdis Bassette

## 2019-10-13 NOTE — Progress Notes (Signed)
Dr [name] aware via phone that Pt attempted to get out of bed and retreive urinal. Walked a few steps on affected foot. Moderate bleeding to inner gauze that came through on ace wrap (pad of foot). Md ordered to reinforce dressing and he would see him in the morning. Reminded pt to call for assistance as needed with call light in reach.

## 2019-10-14 LAB — CBC
HCT: 30 % — ABNORMAL LOW (ref 39.0–52.0)
Hemoglobin: 9.5 g/dL — ABNORMAL LOW (ref 13.0–17.0)
MCH: 31 pg (ref 26.0–34.0)
MCHC: 31.7 g/dL (ref 30.0–36.0)
MCV: 98 fL (ref 80.0–100.0)
Platelets: 137 10*3/uL — ABNORMAL LOW (ref 150–400)
RBC: 3.06 MIL/uL — ABNORMAL LOW (ref 4.22–5.81)
RDW: 15.1 % (ref 11.5–15.5)
WBC: 6.3 10*3/uL (ref 4.0–10.5)
nRBC: 0 % (ref 0.0–0.2)

## 2019-10-14 LAB — BASIC METABOLIC PANEL
Anion gap: 8 (ref 5–15)
BUN: 18 mg/dL (ref 6–20)
CO2: 25 mmol/L (ref 22–32)
Calcium: 9.1 mg/dL (ref 8.9–10.3)
Chloride: 98 mmol/L (ref 98–111)
Creatinine, Ser: 0.9 mg/dL (ref 0.61–1.24)
GFR calc Af Amer: >60 mL/min (ref >60)
GFR calc non Af Amer: >60 mL/min (ref >60)
Glucose, Bld: 133 mg/dL — ABNORMAL HIGH (ref 70–99)
Potassium: 4.2 mmol/L (ref 3.5–5.1)
Sodium: 131 mmol/L — ABNORMAL LOW (ref 135–145)

## 2019-10-14 LAB — GLUCOSE, CAPILLARY
Glucose-Capillary: 122 mg/dL — ABNORMAL HIGH (ref 70–99)
Glucose-Capillary: 150 mg/dL — ABNORMAL HIGH (ref 70–99)
Glucose-Capillary: 155 mg/dL — ABNORMAL HIGH (ref 70–99)
Glucose-Capillary: 173 mg/dL — ABNORMAL HIGH (ref 70–99)
Glucose-Capillary: 182 mg/dL — ABNORMAL HIGH (ref 70–99)
Glucose-Capillary: 187 mg/dL — ABNORMAL HIGH (ref 70–99)
Glucose-Capillary: 214 mg/dL — ABNORMAL HIGH (ref 70–99)

## 2019-10-14 MED ORDER — FUROSEMIDE 20 MG PO TABS
20.0000 mg | ORAL_TABLET | Freq: Every day | ORAL | Status: DC
  Administered 2019-10-15 – 2019-10-18 (×3): 20 mg via ORAL
  Filled 2019-10-14 (×4): qty 1

## 2019-10-14 MED ORDER — INSULIN ASPART 100 UNIT/ML ~~LOC~~ SOLN
0.0000 [IU] | Freq: Every day | SUBCUTANEOUS | Status: DC
  Administered 2019-10-14: 2 [IU] via SUBCUTANEOUS

## 2019-10-14 MED ORDER — INSULIN ASPART 100 UNIT/ML ~~LOC~~ SOLN
0.0000 [IU] | Freq: Three times a day (TID) | SUBCUTANEOUS | Status: DC
  Administered 2019-10-14 (×2): 2 [IU] via SUBCUTANEOUS
  Administered 2019-10-15 – 2019-10-17 (×5): 1 [IU] via SUBCUTANEOUS
  Administered 2019-10-17: 2 [IU] via SUBCUTANEOUS
  Administered 2019-10-18: 1 [IU] via SUBCUTANEOUS
  Administered 2019-10-18: 2 [IU] via SUBCUTANEOUS

## 2019-10-14 NOTE — Progress Notes (Signed)
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   Per prior note:On 9/15 patient had Open reduction, perc pinning left hallux fracture, incision and drainage left hallux. On 10/14 patient had amputation left hallux.  Cultures grew MSSA On 11/4 had repair of complex dehiscence.  Cultures grew Pseudomonas.  He was started on cefepime on admission  MRI of left foot 12/11: Acute osteomyelitis of first metatarsal head and diaphysis with edema and enhancement proximally to the level of first metatarsal base.  Complex rim-enhancing fluid suggestive of 2.6 cm abscess at the distal aspect of first metatarsal base.  Rim-enhancing fluid collection in second MTP joint which is dorsally dislocated suggestive of septic arthritis.  Marrow edema in the second metatarsal and second digit proximal phalanx suggestive of Acute osteomyelitis.  Concern for osteo of the third metatarsal head and findings of diffuse myositis and chronic denervation changes.  status post left foot I&D left partial first ray amputation with [name] 12/13  A & P   Principal Problem:   Acute osteomyelitis of toe of left foot ([place]) Active Problems:   Hypertension   ETOH abuse   Diabetic neuropathy ([place])   Diabetic infection of left foot (H[facility])   Osteomyelitis of left foot, status post left foot I&D left partial first ray amputation with [name] 12/13, POD 1 Afebrile hemodynamically stable without leukocytosis.  Cultures grew Pseudomonas in November, MSSA in October.  On cefepime from admission.  MRI results as above -Podiatry: Continue IV  antibiotics, plan for return to the OR for wound debridement and closure Wednesday  Hypertension -Continue lisinopril -Restart home Lasix  Type 2 diabetes HA1C 5.3 -Hold home hypoglycemics -Continue sliding scale  Ethanol abuse -CIWA protocol  Chronic neuropathic pain -Continue home scheduled Neurontin and oxycodone as needed   DVT prophylaxis: SCDs Diet: Carb modified Family Communication: No family at bedside Disposition Plan: Plan for back to the OR on Wednesday  Consultants  Podiatry  Procedures  none  Antibiotics  Cefepime 12/11->      Subjective   Patient examined at bedside no acute distress resting comfortably on room air.  States he does have some pain of his left foot which is tolerable.  Yesterday after his surgery he needed to use the commode and got out of bed to grab the commode.  Unfortunately he stepped on his foot which opened his wound and he started having bleeding.  This was addressed by podiatry and he has not had any rebleeding since.  He denies any complaints at this time other than those stated above.  Remaining ROS negative.   Objective   Vitals:   10/13/19 1746 10/13/19 2121 10/14/19 0542 10/14/19 1330  BP: (!) 147/91 (!) 161/80 123/79 122/70  Pulse: 80 87 78 78  Resp: 18 18 18    Temp: 99.6 F (37.6 C) 98.9 F (37.2 C) 98.6 F (37 C) 98.6 F (37 C)  TempSrc: Oral Oral Oral Oral  SpO2: 100% 97% 95% 99%  Weight:      Height:        Intake/Output Summary (Last 24 hours) at  10/14/2019 1344 Last data filed at 10/14/2019 1038 Gross per 24 hour  Intake 1236.9 ml  Output 2975 ml  Net -1738.1 ml   Filed Weights   10/12/19 0628  Weight: 121.9 kg    Examination:  Physical Exam Vitals and nursing note reviewed.  Constitutional:      Appearance: He is obese.  HENT:     Head: Normocephalic and atraumatic.     Mouth/Throat:     Mouth: Mucous membranes are moist.  Eyes:     Extraocular Movements: Extraocular movements intact.    Cardiovascular:     Rate and Rhythm: Normal rate and regular rhythm.  Pulmonary:     Effort: Pulmonary effort is normal. No respiratory distress.     Breath sounds: Normal breath sounds.  Abdominal:     General: Abdomen is flat.     Palpations: Abdomen is soft.  Musculoskeletal:        General: No swelling.     Comments: Left foot with Ace wrap.  Bandage clean dry and intact. Right foot with dried blood.  Neurological:     General: No focal deficit present.     Mental Status: He is alert. Mental status is at baseline.  Psychiatric:        Mood and Affect: Mood normal.        Behavior: Behavior normal.     Data Reviewed: I have personally reviewed following labs and imaging studies  CBC: Recent Labs  Lab 10/11/19 2015 10/12/19 0447 10/13/19 0452 10/14/19 0429  WBC 5.6 5.0 4.9 6.3  NEUTROABS 3.9  --   --   --   HGB 9.0* 10.0* 9.1* 9.5*  HCT 28.4* 31.3* 28.9* 30.0*  MCV 96.9 97.8 96.7 98.0  PLT 127* 121* 133* 0000000*   Basic Metabolic Panel: Recent Labs  Lab 10/11/19 2015 10/11/19 2016 10/12/19 0447 10/13/19 0452 10/14/19 0429  NA  --  137 141 136 131*  K  --  3.9 4.5 4.3 4.2  CL  --  104 104 102 98  CO2  --  26 28 27 25   GLUCOSE  --  163* 171* 166* 133*  BUN  --  19 15 16 18   CREATININE  --  0.94 0.94 0.86 0.90  CALCIUM  --  9.0 9.5 9.0 9.1  MG 2.1  --   --   --   --   PHOS 3.2  --   --   --   --    GFR: Estimated Creatinine Clearance: 130.2 mL/min (by C-G formula based on SCr of 0.9 mg/dL). Liver Function Tests: Recent Labs  Lab 10/11/19 2016  AST 13*  ALT 12  ALKPHOS 85  BILITOT 0.8  PROT 7.0  ALBUMIN 3.5   No results for input(s): LIPASE, AMYLASE in the last 168 hours. No results for input(s): AMMONIA in the last 168 hours. Coagulation Profile: Recent Labs  Lab 10/11/19 2015  INR 1.0   Cardiac Enzymes: No results for input(s): CKTOTAL, CKMB, CKMBINDEX, TROPONINI in the last 168 hours. BNP (last 3 results) No results for input(s): PROBNP  in the last 8760 hours. HbA1C: Recent Labs    10/11/19 2016  HGBA1C 5.3   CBG: Recent Labs  Lab 10/13/19 2016 10/14/19 0014 10/14/19 0339 10/14/19 0745 10/14/19 1149  GLUCAP 216* 155* 122* 150* 173*   Lipid Profile: No results for input(s): CHOL, HDL, LDLCALC, TRIG, CHOLHDL, LDLDIRECT in the last 72 hours. Thyroid Function Tests: No results for input(s): TSH, T4TOTAL, FREET4, T3FREE,  THYROIDAB in the last 72 hours. Anemia Panel: No results for input(s): VITAMINB12, FOLATE, FERRITIN, TIBC, IRON, RETICCTPCT in the last 72 hours. Sepsis Labs: No results for input(s): PROCALCITON, LATICACIDVEN in the last 168 hours.  Recent Results (from the past 240 hour(s))  Blood Cultures x 2 sites     Status: None (Preliminary result)   Collection Time: 10/11/19 10:17 PM   Specimen: BLOOD LEFT HAND  Result Value Ref Range Status   Specimen Description   Final    BLOOD LEFT HAND Performed at [facility] [address], [place], [place] [address]    Special Requests   Final    BOTTLES DRAWN AEROBIC ONLY Blood Culture adequate volume Performed at [facility] [address], [place], [place] [address]    Culture   Final    NO GROWTH 2 DAYS Performed at [facility] [address] [place], [place] [address]    Report Status PENDING  Incomplete  Blood Cultures x 2 sites     Status: None (Preliminary result)   Collection Time: 10/11/19 10:17 PM   Specimen: BLOOD  Result Value Ref Range Status   Specimen Description   Final    BLOOD LEFT ANTECUBITAL Performed at [facility] [address], [place], [place] [address]    Special Requests   Final    BOTTLES DRAWN AEROBIC ONLY Blood Culture adequate volume Performed at [facility] [address] [place], [place] [address]    Culture   Final    NO GROWTH 2 DAYS Performed at [facility] [address] [place], [place] [address]    Report  Status PENDING  Incomplete  SARS CORONAVIRUS 2 (TAT 6-24 HRS) Nasopharyngeal Nasopharyngeal Swab     Status: None   Collection Time: 10/12/19  1:47 AM   Specimen: Nasopharyngeal Swab  Result Value Ref Range Status   SARS Coronavirus 2 NEGATIVE NEGATIVE Final    Comment: (NOTE) SARS-CoV-2 target nucleic acids are NOT DETECTED. The SARS-CoV-2 RNA is generally detectable in upper and lower respiratory specimens during the acute phase of infection. Negative results do not preclude SARS-CoV-2 infection, do not rule out co-infections with other pathogens, and should not be used as the sole basis for treatment or other patient management decisions. Negative results must be combined with clinical observations, patient history, and epidemiological information. The expected result is Negative. Fact Sheet for Patients: SugarRoll.be Fact Sheet for Healthcare Providers: https://www.woods-mathews.com/ This test is not yet approved or cleared by the [place] FDA and  has been authorized for detection and/or diagnosis of SARS-CoV-2 by FDA under an Emergency Use Authorization (EUA). This EUA will remain  in effect (meaning this test can be used) for the duration of the COVID-19 declaration under Section 56 4(b)(1) of the Act, 21 U.S.C. section 360bbb-3(b)(1), unless the authorization is terminated or revoked sooner. Performed at [facility], [place] [address] [place], [place] [address]   Surgical pcr screen     Status: None   Collection Time: 10/12/19  6:30 AM   Specimen: Nasal Mucosa; Nasal Swab  Result Value Ref Range Status   MRSA, PCR NEGATIVE NEGATIVE Final   Staphylococcus aureus NEGATIVE NEGATIVE Final    Comment: (NOTE) The Xpert SA Assay (FDA approved for NASAL specimens in patients 36 years of age and older), is one component of a comprehensive surveillance program. It is not intended to diagnose infection nor to guide or monitor  treatment. Performed at [facility], [place] [address] [place], [place] [address]   Aerobic/Anaerobic Culture (surgical/deep wound)     Status: None (Preliminary result)   Collection Time: 10/13/19 10:04 AM   Specimen: PATH Amputaion Arm/Leg; Tissue  Result Value Ref Range Status   Specimen Description WOUND LEFT FOOT  Final   Special Requests SPEC A ON SWABS  Final   Gram Stain   Final    RARE WBC PRESENT, PREDOMINANTLY PMN FEW GRAM POSITIVE COCCI RARE GRAM POSITIVE RODS Performed at [facility], [place] [address] [place], [place] [address]    Culture PENDING  Incomplete   Report Status PENDING  Incomplete  Aerobic/Anaerobic Culture (surgical/deep wound)     Status: None (Preliminary result)   Collection Time: 10/13/19 10:07 AM   Specimen: PATH Digit amputation; Tissue  Result Value Ref Range Status   Specimen Description   Final    BONE LEFT FOOT 1ST METATARSAL Performed at [facility], [place] [address] [place], [place] [address]    Special Requests   Final    NONE Performed at [facility], [place] [address], [place], [place] [address]    Gram Stain NO WBC SEEN NO ORGANISMS SEEN   Final   Culture   Final    RARE STAPHYLOCOCCUS AUREUS CULTURE REINCUBATED FOR BETTER GROWTH Performed at [facility], [place] [address] [place], [place] [address]    Report Status PENDING  Incomplete         Radiology Studies: DG Foot 2 Views Left  Result Date: 10/13/2019 CLINICAL DATA:  Postoperative EXAM: LEFT FOOT - 2 VIEW COMPARISON:  10/11/2019 FINDINGS: Interval postoperative findings of revision transmetatarsal amputation of the left first digit. Osseous structures are otherwise intact. Joint spaces are well preserved. Bandage material about the surgical site. IMPRESSION: Interval postoperative findings of revision transmetatarsal amputation of the left first digit. Electronically Signed   By: Eddie Candle M.D.   On: 10/13/2019  13:26        Scheduled Meds: . amitriptyline  100 mg Oral QHS  . atorvastatin  40 mg Oral QPM  . DULoxetine  60 mg Oral Daily  . feeding supplement (ENSURE ENLIVE)  237 mL Oral BID BM  . folic acid  1 mg Oral Daily  . gabapentin  1,200 mg Oral QHS  . gabapentin  900 mg Oral BID WC  . glycopyrrolate  1 mg Oral BID  . insulin aspart  0-5 Units Subcutaneous QHS  . insulin aspart  0-9 Units Subcutaneous TID WC  . lisinopril  20 mg Oral Daily  . multivitamin with minerals  1 tablet Oral Daily  . nutrition supplement (JUVEN)  1 packet Oral BID BM  . pantoprazole  40 mg Oral BID  . primidone  50 mg Oral BID  . thiamine  100 mg Oral Daily   Or  . thiamine  100 mg Intravenous Daily   Continuous Infusions: . sodium chloride 1,000 mL (10/13/19 0520)  . ceFEPime (MAXIPIME) IV    . ceFEPime (MAXIPIME) IV 2 g (10/14/19 0542)     LOS: 3 days    Time spent: 15 minutes with over 50% of the time coordinating the patient's care    Harold Hedge, DO [facility] Pager [phone]  If 7PM-7AM, please contact night-coverage [email or web address] Password [email or web address] 10/14/2019, 1:44 PM

## 2019-10-14 NOTE — Progress Notes (Signed)
Subjective:  Patient ID: Eugene Diaz, male    DOB: Jan 25, 1966,  MRN: [id number]  Seen bedside. Had bleeding event shortly after surgery yesterday but no other concerns. Objective:   Vitals:   10/14/19 0542 10/14/19 1330  BP: 123/79 122/70  Pulse: 78 78  Resp: 18   Temp: 98.6 F (37 C) 98.6 F (37 C)  SpO2: 95% 99%   General AA&O x3. Normal mood and affect.  Vascular Foot warm and well perfused intact capillary refill to the digits.  Neurologic Epicritic sensation grossly absent.  Dermatologic Amputation site healing well, no purulence, decreasing erythema. 2nd toe edematous  Orthopedic: Left hallux amputation noted.   Results for orders placed or performed during the hospital encounter of 10/11/19 (from the past 24 hour(s))  Glucose, capillary     Status: Abnormal   Collection Time: 10/13/19  8:16 PM  Result Value Ref Range   Glucose-Capillary 216 (H) 70 - 99 mg/dL  Glucose, capillary     Status: Abnormal   Collection Time: 10/14/19 12:14 AM  Result Value Ref Range   Glucose-Capillary 155 (H) 70 - 99 mg/dL  Glucose, capillary     Status: Abnormal   Collection Time: 10/14/19  3:39 AM  Result Value Ref Range   Glucose-Capillary 122 (H) 70 - 99 mg/dL  AM CBC     Status: Abnormal   Collection Time: 10/14/19  4:29 AM  Result Value Ref Range   WBC 6.3 4.0 - 10.5 K/uL   RBC 3.06 (L) 4.22 - 5.81 MIL/uL   Hemoglobin 9.5 (L) 13.0 - 17.0 g/dL   HCT 30.0 (L) 39.0 - 52.0 %   MCV 98.0 80.0 - 100.0 fL   MCH 31.0 26.0 - 34.0 pg   MCHC 31.7 30.0 - 36.0 g/dL   RDW 15.1 11.5 - 15.5 %   Platelets 137 (L) 150 - 400 K/uL   nRBC 0.0 0.0 - 0.2 %  AM BMP     Status: Abnormal   Collection Time: 10/14/19  4:29 AM  Result Value Ref Range   Sodium 131 (L) 135 - 145 mmol/L   Potassium 4.2 3.5 - 5.1 mmol/L   Chloride 98 98 - 111 mmol/L   CO2 25 22 - 32 mmol/L   Glucose, Bld 133 (H) 70 - 99 mg/dL   BUN 18 6 - 20 mg/dL   Creatinine, Ser 0.90 0.61 - 1.24 mg/dL   Calcium 9.1 8.9 - 10.3  mg/dL   GFR calc non Af Amer >60 >60 mL/min   GFR calc Af Amer >60 >60 mL/min   Anion gap 8 5 - 15  Glucose, capillary     Status: Abnormal   Collection Time: 10/14/19  7:45 AM  Result Value Ref Range   Glucose-Capillary 150 (H) 70 - 99 mg/dL  Glucose, capillary     Status: Abnormal   Collection Time: 10/14/19 11:49 AM  Result Value Ref Range   Glucose-Capillary 173 (H) 70 - 99 mg/dL  Glucose, capillary     Status: Abnormal   Collection Time: 10/14/19  1:27 PM  Result Value Ref Range   Glucose-Capillary 182 (H) 70 - 99 mg/dL  Glucose, capillary     Status: Abnormal   Collection Time: 10/14/19  5:01 PM  Result Value Ref Range   Glucose-Capillary 187 (H) 70 - 99 mg/dL   Results for orders placed or performed during the hospital encounter of 10/11/19  Blood Cultures x 2 sites     Status: None (Preliminary result)  Collection Time: 10/11/19 10:17 PM   Specimen: BLOOD LEFT HAND  Result Value Ref Range Status   Specimen Description   Final    BLOOD LEFT HAND Performed at [facility] [address], [place], [place] [address]    Special Requests   Final    BOTTLES DRAWN AEROBIC ONLY Blood Culture adequate volume Performed at [facility] [address] [place], [place] [address]    Culture   Final    NO GROWTH 2 DAYS Performed at [facility] [address] [place], [place] [address]    Report Status PENDING  Incomplete  Blood Cultures x 2 sites     Status: None (Preliminary result)   Collection Time: 10/11/19 10:17 PM   Specimen: BLOOD  Result Value Ref Range Status   Specimen Description   Final    BLOOD LEFT ANTECUBITAL Performed at [facility] [address], [place], [place] [address]    Special Requests   Final    BOTTLES DRAWN AEROBIC ONLY Blood Culture adequate volume Performed at [facility] [address] [place], [place] [address]    Culture   Final    NO GROWTH 2 DAYS  Performed at [facility] [address] [place], [place] [address]    Report Status PENDING  Incomplete  SARS CORONAVIRUS 2 (TAT 6-24 HRS) Nasopharyngeal Nasopharyngeal Swab     Status: None   Collection Time: 10/12/19  1:47 AM   Specimen: Nasopharyngeal Swab  Result Value Ref Range Status   SARS Coronavirus 2 NEGATIVE NEGATIVE Final    Comment: (NOTE) SARS-CoV-2 target nucleic acids are NOT DETECTED. The SARS-CoV-2 RNA is generally detectable in upper and lower respiratory specimens during the acute phase of infection. Negative results do not preclude SARS-CoV-2 infection, do not rule out co-infections with other pathogens, and should not be used as the sole basis for treatment or other patient management decisions. Negative results must be combined with clinical observations, patient history, and epidemiological information. The expected result is Negative. Fact Sheet for Patients: SugarRoll.be Fact Sheet for Healthcare Providers: https://www.woods-mathews.com/ This test is not yet approved or cleared by the [place] FDA and  has been authorized for detection and/or diagnosis of SARS-CoV-2 by FDA under an Emergency Use Authorization (EUA). This EUA will remain  in effect (meaning this test can be used) for the duration of the COVID-19 declaration under Section 56 4(b)(1) of the Act, 21 U.S.C. section 360bbb-3(b)(1), unless the authorization is terminated or revoked sooner. Performed at [facility], [place] [address], [place], [place] [address]   Surgical pcr screen     Status: None   Collection Time: 10/12/19  6:30 AM   Specimen: Nasal Mucosa; Nasal Swab  Result Value Ref Range Status   MRSA, PCR NEGATIVE NEGATIVE Final   Staphylococcus aureus NEGATIVE NEGATIVE Final    Comment: (NOTE) The Xpert SA Assay (FDA approved for NASAL specimens in patients 15 years of age and older), is one component of a comprehensive  surveillance program. It is not intended to diagnose infection nor to guide or monitor treatment. Performed at [facility], [place] [address], [place],  [address]   Aerobic/Anaerobic Culture (surgical/deep wound)     Status: None (Preliminary result)   Collection Time: 10/13/19 10:04 AM   Specimen: PATH Amputaion Arm/Leg; Tissue  Result Value Ref Range Status   Specimen Description WOUND LEFT FOOT  Final  Special Requests SPEC A ON SWABS  Final   Gram Stain   Final    RARE WBC PRESENT, PREDOMINANTLY PMN FEW GRAM POSITIVE COCCI RARE GRAM POSITIVE RODS    Culture   Final    ABUNDANT STAPHYLOCOCCUS AUREUS CULTURE REINCUBATED FOR BETTER GROWTH Performed at [facility], [place] [address], [place], [place] [address]    Report Status PENDING  Incomplete  Aerobic/Anaerobic Culture (surgical/deep wound)     Status: None (Preliminary result)   Collection Time: 10/13/19 10:07 AM   Specimen: PATH Digit amputation; Tissue  Result Value Ref Range Status   Specimen Description   Final    BONE LEFT FOOT 1ST METATARSAL Performed at [facility], [place] [address] [place], [place] [address]    Special Requests   Final    NONE Performed at [facility], [place] [address], [place], [place] [address]    Gram Stain NO WBC SEEN NO ORGANISMS SEEN   Final   Culture   Final    RARE STAPHYLOCOCCUS AUREUS CULTURE REINCUBATED FOR BETTER GROWTH Performed at [facility], [place] [address] [place], [place] [address]    Report Status PENDING  Incomplete    Assessment & Plan:  Patient was evaluated and treated and all questions answered.  Left Foot Osteomyelitis, abscess -Micro bone pending - with rare Staph. Awaiting sensitivity. -Continue empirics until we can tailor based upon cultures -Orders placed for dressing change. Wound flushed and packed today. To be done again by nursing tomorrow. -To OR Wednesday for left foot wound debridement,  partial second ray resection wound closure. -WBAT in CAM BOOT only. Discussed with patient the importance of compliance with WB precautions -Work with PT/OT as needed -Will continue to follow. Please contact with questions or concerns.  Evelina Bucy, DPM  Accessible via secure chat for questions or concerns.

## 2019-10-14 NOTE — Evaluation (Signed)
Physical Therapy Evaluation Patient Details Name: Eugene Diaz MRN: [id number] DOB: 11-Jun-1966 Today's Date: 10/14/2019   History of Present Illness  53 year old male with past medical history of type 2 diabetes, alcohol use, peripheral neuropathy, status post amputation of left great toe and first MTP,  9/15 patient had Open reduction, perc pinning left hallux fracture, incision and drainage left hallux, 10/14 patient had amputation left hallux, 11/4 had repair of complex dehiscence. Pt  presented on 12/11 for direct admission from podiatry office due to cellulitis and osteomyelitis of left foot. pt is s/p I&D today of abscess with first metatarsal resection and will likely have to leave the wound open and return later to close.  Clinical Impression  Pt admitted with above diagnosis.  Pt agreeable to OOB, amb in hallway with min/guard and RW. Pt will likely return to surgery on Wednesday for wound closure.   Pt currently with functional limitations due to the deficits listed below (see PT Problem List). Pt will benefit from skilled PT to increase their independence and safety with mobility to allow discharge to the venue listed below.       Follow Up Recommendations No PT follow up    Equipment Recommendations  Rolling walker with 5" wheels(vs no AD, depending on progress)    Recommendations for Other Services       Precautions / Restrictions Precautions Precautions: Fall Restrictions LLE Weight Bearing: Weight bearing as tolerated Other Position/Activity Restrictions: per progress notes-->WBAT to LLE in CAM boot      Mobility  Bed Mobility Overal bed mobility: Needs Assistance Bed Mobility: Supine to Sit     Supine to sit: Modified independent (Device/Increase time);HOB elevated        Transfers Overall transfer level: Needs assistance Equipment used: Rolling walker (2 wheeled) Transfers: Sit to/from Stand Sit to Stand: Min guard;Supervision         General  transfer comment: for safety  Ambulation/Gait Ambulation/Gait assistance: Min guard Gait Distance (Feet): 120 Feet Assistive device: Rolling walker (2 wheeled) Gait Pattern/deviations: Decreased stance time - left;Decreased stride length;Decreased weight shift to left     General Gait Details: cues for decr step length, encouraged heel WBing and decr push off on L foot to protect surgical site  Stairs            Wheelchair Mobility    Modified Rankin (Stroke Patients Only)       Balance                                             Pertinent Vitals/Pain Pain Assessment: No/denies pain    Home Living Family/patient expects to be discharged to:: Private residence Living Arrangements: Alone Available Help at Discharge: Family;Available PRN/intermittently Type of Home: Mobile home Home Access: Stairs to enter Entrance Stairs-Rails: None Entrance Stairs-Number of Steps: 2 Home Layout: One level Home Equipment: Cane - single point      Prior Function Level of Independence: Independent with assistive device(s)         Comments: uses SPC     Hand Dominance        Extremity/Trunk Assessment   Upper Extremity Assessment Upper Extremity Assessment: Overall WFL for tasks assessed    Lower Extremity Assessment LLE Deficits / Details: df AROM grossly WFL, pf limited to ~ 8 degrees,  knee and hip [facility]  Communication   Communication: No difficulties  Cognition Arousal/Alertness: Awake/alert Behavior During Therapy: WFL for tasks assessed/performed Overall Cognitive Status: Within Functional Limits for tasks assessed                                        General Comments      Exercises     Assessment/Plan    PT Assessment Patient needs continued PT services  PT Problem List Decreased strength;Decreased activity tolerance;Decreased mobility;Decreased knowledge of use of DME       PT Treatment Interventions  DME instruction;Gait training;Functional mobility training;Therapeutic activities;Patient/family education;Therapeutic exercise    PT Goals (Current goals can be found in the Care Plan section)  Acute Rehab PT Goals PT Goal Formulation: With patient Time For Goal Achievement: 10/28/19 Potential to Achieve Goals: Good    Frequency Min 3X/week   Barriers to discharge        Co-evaluation               AM-PAC PT "6 Clicks" Mobility  Outcome Measure Help needed turning from your back to your side while in a flat bed without using bedrails?: None Help needed moving from lying on your back to sitting on the side of a flat bed without using bedrails?: None Help needed moving to and from a bed to a chair (including a wheelchair)?: A Little Help needed standing up from a chair using your arms (e.g., wheelchair or bedside chair)?: A Little Help needed to walk in hospital room?: A Little Help needed climbing 3-5 steps with a railing? : A Little 6 Click Score: 20    End of Session   Activity Tolerance: Patient tolerated treatment well Patient left: in chair;with call bell/phone within reach   PT Visit Diagnosis: Difficulty in walking, not elsewhere classified (R26.2)    Time: [id number] PT Time Calculation (min) (ACUTE ONLY): 18 min   Charges:   PT Evaluation $PT Eval Low Complexity: 1 Low          Avah Bashor, PT   Acute Rehab Dept [facility]): [id number]   10/14/2019   [facility] 10/14/2019, 1:14 PM

## 2019-10-14 NOTE — Plan of Care (Signed)
Will change patient's dressing and packing this afternoon. Orders placed for wound care supplies.  Continue IV ABx. Plan for RTOR for wound debridement and closure Wednesday. Will call OR to make arrangements.

## 2019-10-14 NOTE — Consult Note (Signed)
PV Navigator consult acknowledged and chart reviewed. I was able to speak with patient via telephone. Role explained and contact information given to patient.   53 y/o male direct admit 10/11/19 from podiatry follow up with diagnosis of osteomyelitis of the left 1st metatarsal confirmed by MRI. Patient underwent left foot incision and drainage of the deep fascia as well as a partial left 1st Ray amputation on 10/13/19. Pt reports that plan is for him to return to OR 10/16/19 for closure of this wound. Noted that other physical assessments report extremity warm to touch and adequately perfused. No reports of discomfort during our communication.  Medical history to include DM 2, EtOH abuse, PVD and HTN.  Patient lives alone and expresses that his goal is to return home once surgery is over. He feels he is able to manage any dressings/wound care independently, however wound care orders will probably change post operatively on Wed. He denies any other concerns or barriers at this time.  Active disciplines include Nutrition and TOC.  Will follow as care transitions. Thank you, Cletis Media RN BSN CWS [place] [phone]

## 2019-10-15 LAB — BASIC METABOLIC PANEL
Anion gap: 8 (ref 5–15)
BUN: 20 mg/dL (ref 6–20)
CO2: 26 mmol/L (ref 22–32)
Calcium: 9.1 mg/dL (ref 8.9–10.3)
Chloride: 98 mmol/L (ref 98–111)
Creatinine, Ser: 0.94 mg/dL (ref 0.61–1.24)
GFR calc Af Amer: >60 mL/min (ref >60)
GFR calc non Af Amer: >60 mL/min (ref >60)
Glucose, Bld: 172 mg/dL — ABNORMAL HIGH (ref 70–99)
Potassium: 4.3 mmol/L (ref 3.5–5.1)
Sodium: 132 mmol/L — ABNORMAL LOW (ref 135–145)

## 2019-10-15 LAB — SURGICAL PATHOLOGY

## 2019-10-15 LAB — CBC
HCT: 26.7 % — ABNORMAL LOW (ref 39.0–52.0)
Hemoglobin: 8.6 g/dL — ABNORMAL LOW (ref 13.0–17.0)
MCH: 31.2 pg (ref 26.0–34.0)
MCHC: 32.2 g/dL (ref 30.0–36.0)
MCV: 96.7 fL (ref 80.0–100.0)
Platelets: 131 10*3/uL — ABNORMAL LOW (ref 150–400)
RBC: 2.76 MIL/uL — ABNORMAL LOW (ref 4.22–5.81)
RDW: 14.6 % (ref 11.5–15.5)
WBC: 4.9 10*3/uL (ref 4.0–10.5)
nRBC: 0 % (ref 0.0–0.2)

## 2019-10-15 LAB — GLUCOSE, CAPILLARY
Glucose-Capillary: 149 mg/dL — ABNORMAL HIGH (ref 70–99)
Glucose-Capillary: 142 mg/dL — ABNORMAL HIGH (ref 70–99)
Glucose-Capillary: 148 mg/dL — ABNORMAL HIGH (ref 70–99)
Glucose-Capillary: 137 mg/dL — ABNORMAL HIGH (ref 70–99)

## 2019-10-15 MED ORDER — GABAPENTIN 300 MG PO CAPS
600.0000 mg | ORAL_CAPSULE | Freq: Every day | ORAL | Status: DC
  Administered 2019-10-15 – 2019-10-17 (×3): 600 mg via ORAL
  Filled 2019-10-15 (×3): qty 2

## 2019-10-15 MED ORDER — SODIUM CHLORIDE 0.9% FLUSH
10.0000 mL | INTRAVENOUS | Status: DC | PRN
  Administered 2019-10-15: 10 mL

## 2019-10-15 MED ORDER — SODIUM CHLORIDE 0.9% FLUSH: 10.0000 mL | Freq: Two times a day (BID) | INTRAVENOUS | Status: DC

## 2019-10-15 MED ORDER — GABAPENTIN 300 MG PO CAPS
300.0000 mg | ORAL_CAPSULE | Freq: Two times a day (BID) | ORAL | Status: DC
  Administered 2019-10-15 – 2019-10-18 (×7): 300 mg via ORAL
  Filled 2019-10-15 (×7): qty 1

## 2019-10-15 NOTE — Progress Notes (Signed)
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   Per prior note:On 9/15 patient had Open reduction, perc pinning left hallux fracture, incision and drainage left hallux. On 10/14 patient had amputation left hallux.  Cultures grew MSSA On 11/4 had repair of complex dehiscence.  Cultures grew Pseudomonas.  He was started on cefepime on admission  MRI of left foot 12/11: Acute osteomyelitis of first metatarsal head and diaphysis with edema and enhancement proximally to the level of first metatarsal base.  Complex rim-enhancing fluid suggestive of 2.6 cm abscess at the distal aspect of first metatarsal base.  Rim-enhancing fluid collection in second MTP joint which is dorsally dislocated suggestive of septic arthritis.  Marrow edema in the second metatarsal and second digit proximal phalanx suggestive of Acute osteomyelitis.  Concern for osteo of the third metatarsal head and findings of diffuse myositis and chronic denervation changes.  status post left foot I&D left partial first ray amputation with [name] 12/13  A & P   Principal Problem:   Acute osteomyelitis of toe of left foot ([place]) Active Problems:   Hypertension   ETOH abuse   Diabetic neuropathy ([place])   Diabetic infection of left foot ([facility])   Osteomyelitis of left foot, status post left foot I&D left partial first ray amputation with [name] 12/13, POD 2 Afebrile hemodynamically stable without leukocytosis.  Cultures grew Pseudomonas in November, MSSA in October.  On cefepime from admission.  MRI results as above -Podiatry: Continue IV  antibiotics, plan for return to the OR for wound debridement and closure tomorrow, n.p.o. after midnight  Hypertension -Continue lisinopril -Continue home Lasix  Type 2 diabetes HA1C 5.3 -Hold home hypoglycemics -Continue sliding scale  Ethanol abuse -CIWA protocol  Suspected OSA  Chronic neuropathic pain -Decreased Neurontin daily and bedtime dosing as patient was very somnolent this morning.   DVT prophylaxis: SCDs Diet: Carb modified, n.p.o. after midnight Family Communication: No family at bedside Disposition Plan: Plan for back to the OR on Wednesday  Consultants  Podiatry  Procedures  none  Antibiotics  Cefepime 12/11->      Subjective   Evaluated at bedside, patient was in a deep sleep.  He was responsive to verbal stimuli but kept falling asleep and took a long time to fully wake up.  Once he did he was clear minded and denied any complaints other than some soreness in his postsurgical foot.  Admitted that he does snore and occasionally wake up in the middle the night short of breath.  Never was diagnosed with sleep apnea.  Denies any complaints at this time other than those stated above.   Objective   Vitals:   10/14/19 1330 10/14/19 2036 10/15/19 0532 10/15/19 1353  BP: 122/70 130/79 120/68 (!) 148/72  Pulse: 78 83 71 62  Resp:  20 18 18   Temp: 98.6 F (37 C) 98.4 F (36.9 C) 97.7 F (36.5 C) (!) 97.5 F (36.4 C)  TempSrc: Oral Oral Oral Oral  SpO2: 99% 97% 99% (!) 88%  Weight:      Height:  Intake/Output Summary (Last 24 hours) at 10/15/2019 1719 Last data filed at 10/15/2019 1655 Gross per 24 hour  Intake 1072.96 ml  Output 3400 ml  Net -2327.04 ml   Filed Weights   10/12/19 0628  Weight: 121.9 kg    Examination:  Physical Exam Vitals and nursing note reviewed.  Constitutional:      Appearance: Normal appearance.     Comments: Somnolent but eventually arousable to verbal stimuli  HENT:     Head: Normocephalic and  atraumatic.  Eyes:     Extraocular Movements: Extraocular movements intact.  Cardiovascular:     Rate and Rhythm: Normal rate and regular rhythm.  Pulmonary:     Effort: Pulmonary effort is normal.     Breath sounds: Normal breath sounds.  Abdominal:     General: Abdomen is flat.     Palpations: Abdomen is soft.  Musculoskeletal:        General: No swelling. Normal range of motion.     Cervical back: Normal range of motion. No rigidity.     Comments: Left lower extremity in Ace wrap  Neurological:     General: No focal deficit present.  Psychiatric:        Mood and Affect: Mood normal.        Behavior: Behavior normal.        Thought Content: Thought content normal.        Judgment: Judgment normal.     Data Reviewed: I have personally reviewed following labs and imaging studies  CBC: Recent Labs  Lab 10/11/19 2015 10/12/19 0447 10/13/19 0452 10/14/19 0429 10/15/19 0521  WBC 5.6 5.0 4.9 6.3 4.9  NEUTROABS 3.9  --   --   --   --   HGB 9.0* 10.0* 9.1* 9.5* 8.6*  HCT 28.4* 31.3* 28.9* 30.0* 26.7*  MCV 96.9 97.8 96.7 98.0 96.7  PLT 127* 121* 133* 137* [id number]*   Basic Metabolic Panel: Recent Labs  Lab 10/11/19 2015 10/11/19 2016 10/12/19 0447 10/13/19 0452 10/14/19 0429 10/15/19 0521  NA  --  137 141 136 131* 132*  K  --  3.9 4.5 4.3 4.2 4.3  CL  --  104 104 102 98 98  CO2  --  26 28 27 25 26   GLUCOSE  --  163* 171* 166* 133* 172*  BUN  --  19 15 16 18 20   CREATININE  --  0.94 0.94 0.86 0.90 0.94  CALCIUM  --  9.0 9.5 9.0 9.1 9.1  MG 2.1  --   --   --   --   --   PHOS 3.2  --   --   --   --   --    GFR: Estimated Creatinine Clearance: 124.7 mL/min (by C-G formula based on SCr of 0.94 mg/dL). Liver Function Tests: Recent Labs  Lab 10/11/19 2016  AST 13*  ALT 12  ALKPHOS 85  BILITOT 0.8  PROT 7.0  ALBUMIN 3.5   No results for input(s): LIPASE, AMYLASE in the last 168 hours. No results for input(s): AMMONIA in the last 168 hours. Coagulation  Profile: Recent Labs  Lab 10/11/19 2015  INR 1.0   Cardiac Enzymes: No results for input(s): CKTOTAL, CKMB, CKMBINDEX, TROPONINI in the last 168 hours. BNP (last 3 results) No results for input(s): PROBNP in the last 8760 hours. HbA1C: No results for input(s): HGBA1C in the last 72 hours. CBG: Recent Labs  Lab 10/14/19 1701 10/14/19 2129 10/15/19 0802 10/15/19 1152 10/15/19 1647  GLUCAP 187* 214* 149* 142* 148*   Lipid Profile: No results for input(s): CHOL, HDL, LDLCALC, TRIG, CHOLHDL, LDLDIRECT in the last 72 hours. Thyroid Function Tests: No results for input(s): TSH, T4TOTAL, FREET4, T3FREE, THYROIDAB in the last 72 hours. Anemia Panel: No results for input(s): VITAMINB12, FOLATE, FERRITIN, TIBC, IRON, RETICCTPCT in the last 72 hours. Sepsis Labs: No results for input(s): PROCALCITON, LATICACIDVEN in the last 168 hours.  Recent Results (from the past 240 hour(s))  Blood Cultures x 2 sites     Status: None (Preliminary result)   Collection Time: 10/11/19 10:17 PM   Specimen: BLOOD LEFT HAND  Result Value Ref Range Status   Specimen Description   Final    BLOOD LEFT HAND Performed at [facility] [address], [place], [place] [address]    Special Requests   Final    BOTTLES DRAWN AEROBIC ONLY Blood Culture adequate volume Performed at [facility] [address], [place], [place] [address]    Culture   Final    NO GROWTH 3 DAYS Performed at [facility], [place] [address] [place], [place] [address]    Report Status PENDING  Incomplete  Blood Cultures x 2 sites     Status: None (Preliminary result)   Collection Time: 10/11/19 10:17 PM   Specimen: BLOOD  Result Value Ref Range Status   Specimen Description   Final    BLOOD LEFT ANTECUBITAL Performed at [facility] [address], [place], [place] [address]    Special Requests   Final    BOTTLES DRAWN AEROBIC ONLY Blood Culture adequate  volume Performed at [facility] [address], [place], [place] [address]    Culture   Final    NO GROWTH 3 DAYS Performed at [facility], [place] [address], [place], [place] [address]    Report Status PENDING  Incomplete  SARS CORONAVIRUS 2 (TAT 6-24 HRS) Nasopharyngeal Nasopharyngeal Swab     Status: None   Collection Time: 10/12/19  1:47 AM   Specimen: Nasopharyngeal Swab  Result Value Ref Range Status   SARS Coronavirus 2 NEGATIVE NEGATIVE Final    Comment: (NOTE) SARS-CoV-2 target nucleic acids are NOT DETECTED. The SARS-CoV-2 RNA is generally detectable in upper and lower respiratory specimens during the acute phase of infection. Negative results do not preclude SARS-CoV-2 infection, do not rule out co-infections with other pathogens, and should not be used as the sole basis for treatment or other patient management decisions. Negative results must be combined with clinical observations, patient history, and epidemiological information. The expected result is Negative. Fact Sheet for Patients: SugarRoll.be Fact Sheet for Healthcare Providers: https://www.woods-mathews.com/ This test is not yet approved or cleared by the [place] FDA and  has been authorized for detection and/or diagnosis of SARS-CoV-2 by FDA under an Emergency Use Authorization (EUA). This EUA will remain  in effect (meaning this test can be used) for the duration of the COVID-19 declaration under Section 56 4(b)(1) of the Act, 21 U.S.C. section 360bbb-3(b)(1), unless the authorization is terminated or revoked sooner. Performed at [facility], [place] [address], [place], [place] [address]   Surgical pcr screen     Status: None   Collection Time: 10/12/19  6:30 AM   Specimen: Nasal Mucosa; Nasal Swab  Result Value Ref Range Status   MRSA, PCR NEGATIVE NEGATIVE Final   Staphylococcus aureus NEGATIVE NEGATIVE Final    Comment:  (NOTE)  The Xpert SA Assay (FDA approved for NASAL specimens in patients 56 years of age and older), is one component of a comprehensive surveillance program. It is not intended to diagnose infection nor to guide or monitor treatment. Performed at [facility], [place] [address], [place], [place] [address]   Fungus Culture With Stain     Status: None   Collection Time: 10/13/19 10:04 AM   Specimen: PATH Amputaion Arm/Leg; Tissue  Result Value Ref Range Status   Fungus Stain Final report  Final    Comment: (NOTE) Performed At: [facility] [place], [place] [id number] Rush Farmer MD [id number]    Fungus (Mycology) Culture PENDING  Incomplete   Fungal Source WOUND  Corrected    Comment: LEFT FOOT Performed at [facility], [place] [address] [place], [place] [address] CORRECTED ON 12/13 AT 1553: PREVIOUSLY REPORTED AS ARM   Aerobic/Anaerobic Culture (surgical/deep wound)     Status: None (Preliminary result)   Collection Time: 10/13/19 10:04 AM   Specimen: PATH Amputaion Arm/Leg; Tissue  Result Value Ref Range Status   Specimen Description WOUND LEFT FOOT  Final   Special Requests SPEC A ON SWABS  Final   Gram Stain   Final    RARE WBC PRESENT, PREDOMINANTLY PMN FEW GRAM POSITIVE COCCI RARE GRAM POSITIVE RODS Performed at [facility], [place] [address] [place], [place] [address]    Culture   Final    ABUNDANT STAPHYLOCOCCUS AUREUS WITHIN MIXED ORGANISMS NO ANAEROBES ISOLATED; CULTURE IN PROGRESS FOR 5 DAYS    Report Status PENDING  Incomplete  Fungus Culture Result     Status: None   Collection Time: 10/13/19 10:04 AM  Result Value Ref Range Status   Result 1 Comment  Final    Comment: (NOTE) KOH/Calcofluor preparation:  no fungus observed. Performed At: [facility] [place], [place] [id number] Rush Farmer MD [id number]   Aerobic/Anaerobic Culture (surgical/deep wound)     Status: None  (Preliminary result)   Collection Time: 10/13/19 10:07 AM   Specimen: PATH Digit amputation; Tissue  Result Value Ref Range Status   Specimen Description   Final    LEFT FOOT 1ST METATARSAL Performed at [facility], [place] [address], [place], [place] [address]    Special Requests   Final    NONE Performed at [facility], [place] [address], [place], [place] [address]    Gram Stain   Final    NO WBC SEEN NO ORGANISMS SEEN Performed at [facility], [place] [address], [place], [place] [address]    Culture   Final    RARE STAPHYLOCOCCUS AUREUS NO ANAEROBES ISOLATED; CULTURE IN PROGRESS FOR 5 DAYS    Report Status PENDING  Incomplete   Organism ID, Bacteria STAPHYLOCOCCUS AUREUS  Final      Susceptibility   Staphylococcus aureus - MIC*    CIPROFLOXACIN <=0.5 SENSITIVE Sensitive     ERYTHROMYCIN <=0.25 SENSITIVE Sensitive     GENTAMICIN <=0.5 SENSITIVE Sensitive     OXACILLIN 0.5 SENSITIVE Sensitive     TETRACYCLINE <=1 SENSITIVE Sensitive     VANCOMYCIN <=0.5 SENSITIVE Sensitive     TRIMETH/SULFA <=10 SENSITIVE Sensitive     CLINDAMYCIN <=0.25 SENSITIVE Sensitive     RIFAMPIN <=0.5 SENSITIVE Sensitive     Inducible Clindamycin NEGATIVE Sensitive     * RARE STAPHYLOCOCCUS AUREUS         Radiology Studies: DG Foot  Complete Left  Result Date: 10/14/2019 Please see detailed radiograph report in office note.       Scheduled Meds: . amitriptyline  100 mg Oral QHS  . atorvastatin  40 mg Oral QPM  . DULoxetine  60 mg Oral Daily  . feeding supplement (ENSURE ENLIVE)  237 mL Oral BID BM  . folic acid  1 mg Oral Daily  . furosemide  20 mg Oral Daily  . gabapentin  300 mg Oral BID WC  . gabapentin  600 mg Oral QHS  . glycopyrrolate  1 mg Oral BID  . insulin aspart  0-5 Units Subcutaneous QHS  . insulin aspart  0-9 Units Subcutaneous TID WC  . lisinopril  20 mg Oral Daily  . multivitamin with minerals  1 tablet Oral Daily  . nutrition  supplement (JUVEN)  1 packet Oral BID BM  . pantoprazole  40 mg Oral BID  . primidone  50 mg Oral BID  . sodium chloride flush  10-40 mL Intracatheter Q12H  . thiamine  100 mg Oral Daily   Or  . thiamine  100 mg Intravenous Daily   Continuous Infusions: . sodium chloride 1,000 mL (10/13/19 0520)  . ceFEPime (MAXIPIME) IV    . ceFEPime (MAXIPIME) IV 2 g (10/15/19 1401)     LOS: 4 days    Time spent: 20 minutes with over 50% of the time coordinating the patient's care    Harold Hedge, DO [facility] Pager [phone]  If 7PM-7AM, please contact night-coverage [email or web address] Password [facility] 10/15/2019, 5:19 PM

## 2019-10-16 ENCOUNTER — Encounter (HOSPITAL_COMMUNITY)
Admission: AD | Disposition: A | Discharge status: Home / Self Care | Payer: Self-pay | Source: Ambulatory Visit | Attending: Internal Medicine

## 2019-10-16 ENCOUNTER — Encounter (HOSPITAL_COMMUNITY): Payer: Self-pay | Admitting: Internal Medicine

## 2019-10-16 ENCOUNTER — Inpatient Hospital Stay (HOSPITAL_COMMUNITY): Payer: Medicaid Other

## 2019-10-16 ENCOUNTER — Inpatient Hospital Stay (HOSPITAL_COMMUNITY): Payer: Medicaid Other | Admitting: Anesthesiology

## 2019-10-16 DIAGNOSIS — E0841 Diabetes mellitus due to underlying condition with diabetic mononeuropathy: Secondary | ICD-10-CM

## 2019-10-16 DIAGNOSIS — M869 Osteomyelitis, unspecified: Secondary | ICD-10-CM

## 2019-10-16 DIAGNOSIS — M86672 Other chronic osteomyelitis, left ankle and foot: Secondary | ICD-10-CM

## 2019-10-16 HISTORY — PX: AMPUTATION: SHX166

## 2019-10-16 HISTORY — PX: WOUND DEBRIDEMENT: SHX247

## 2019-10-16 LAB — CBC
HCT: 27.6 % — ABNORMAL LOW (ref 39.0–52.0)
Hemoglobin: 8.6 g/dL — ABNORMAL LOW (ref 13.0–17.0)
MCH: 30.5 pg (ref 26.0–34.0)
MCHC: 31.2 g/dL (ref 30.0–36.0)
MCV: 97.9 fL (ref 80.0–100.0)
Platelets: 164 10*3/uL (ref 150–400)
RBC: 2.82 MIL/uL — ABNORMAL LOW (ref 4.22–5.81)
RDW: 14.7 % (ref 11.5–15.5)
WBC: 5.1 10*3/uL (ref 4.0–10.5)
nRBC: 0 % (ref 0.0–0.2)

## 2019-10-16 LAB — BASIC METABOLIC PANEL
Anion gap: 8 (ref 5–15)
BUN: 32 mg/dL — ABNORMAL HIGH (ref 6–20)
CO2: 26 mmol/L (ref 22–32)
Calcium: 9.1 mg/dL (ref 8.9–10.3)
Chloride: 101 mmol/L (ref 98–111)
Creatinine, Ser: 1.07 mg/dL (ref 0.61–1.24)
GFR calc Af Amer: >60 mL/min (ref >60)
GFR calc non Af Amer: >60 mL/min (ref >60)
Glucose, Bld: 181 mg/dL — ABNORMAL HIGH (ref 70–99)
Potassium: 4.5 mmol/L (ref 3.5–5.1)
Sodium: 135 mmol/L (ref 135–145)

## 2019-10-16 LAB — GLUCOSE, CAPILLARY
Glucose-Capillary: 139 mg/dL — ABNORMAL HIGH (ref 70–99)
Glucose-Capillary: 116 mg/dL — ABNORMAL HIGH (ref 70–99)
Glucose-Capillary: 96 mg/dL (ref 70–99)
Glucose-Capillary: 110 mg/dL — ABNORMAL HIGH (ref 70–99)
Glucose-Capillary: 93 mg/dL (ref 70–99)

## 2019-10-16 SURGERY — DEBRIDEMENT, WOUND
Anesthesia: Monitor Anesthesia Care | Laterality: Left

## 2019-10-16 MED ORDER — FENTANYL CITRATE (PF) 100 MCG/2ML IJ SOLN
INTRAMUSCULAR | Status: DC | PRN
  Administered 2019-10-16: 50 ug via INTRAVENOUS
  Administered 2019-10-16 (×2): 25 ug via INTRAVENOUS

## 2019-10-16 MED ORDER — BUPIVACAINE HCL (PF) 0.5 % IJ SOLN
INTRAMUSCULAR | Status: AC
  Filled 2019-10-16: qty 30

## 2019-10-16 MED ORDER — PROPOFOL 10 MG/ML IV BOLUS
INTRAVENOUS | Status: AC
  Filled 2019-10-16: qty 20

## 2019-10-16 MED ORDER — PROMETHAZINE HCL 25 MG/ML IJ SOLN: 6.2500 mg | INTRAMUSCULAR | Status: DC | PRN

## 2019-10-16 MED ORDER — VANCOMYCIN HCL 1000 MG IV SOLR
INTRAVENOUS | Status: AC
  Filled 2019-10-16: qty 1000

## 2019-10-16 MED ORDER — MIDAZOLAM HCL 5 MG/5ML IJ SOLN
INTRAMUSCULAR | Status: DC | PRN
  Administered 2019-10-16: 2 mg via INTRAVENOUS

## 2019-10-16 MED ORDER — ONDANSETRON HCL 4 MG/2ML IJ SOLN
INTRAMUSCULAR | Status: DC | PRN
  Administered 2019-10-16: 4 mg via INTRAVENOUS

## 2019-10-16 MED ORDER — ALBUTEROL SULFATE HFA 108 (90 BASE) MCG/ACT IN AERS
INHALATION_SPRAY | RESPIRATORY_TRACT | Status: AC
  Filled 2019-10-16: qty 6.7

## 2019-10-16 MED ORDER — VANCOMYCIN HCL 1000 MG IV SOLR
INTRAVENOUS | Status: DC | PRN
  Administered 2019-10-16: 1000 mg

## 2019-10-16 MED ORDER — PROPOFOL 10 MG/ML IV BOLUS
INTRAVENOUS | Status: DC | PRN
  Administered 2019-10-16 (×2): 10 mg via INTRAVENOUS
  Administered 2019-10-16: 30 mg via INTRAVENOUS
  Administered 2019-10-16: 10 mg via INTRAVENOUS

## 2019-10-16 MED ORDER — LACTATED RINGERS IV SOLN: INTRAVENOUS | Status: DC

## 2019-10-16 MED ORDER — HYDROMORPHONE HCL 1 MG/ML IJ SOLN: 0.2500 mg | INTRAMUSCULAR | Status: DC | PRN

## 2019-10-16 MED ORDER — OXYCODONE HCL 5 MG/5ML PO SOLN: 5.0000 mg | Freq: Once | ORAL | Status: DC | PRN

## 2019-10-16 MED ORDER — BUPIVACAINE HCL (PF) 0.5 % IJ SOLN
INTRAMUSCULAR | Status: DC | PRN
  Administered 2019-10-16: 10 mL

## 2019-10-16 MED ORDER — SODIUM CHLORIDE 0.9 % IR SOLN
Status: DC | PRN
  Administered 2019-10-16: 3000 mL

## 2019-10-16 MED ORDER — 0.9 % SODIUM CHLORIDE (POUR BTL) OPTIME
TOPICAL | Status: DC | PRN
  Administered 2019-10-16: 1000 mL

## 2019-10-16 MED ORDER — OXYCODONE HCL 5 MG PO TABS: 5.0000 mg | ORAL_TABLET | Freq: Once | ORAL | Status: DC | PRN

## 2019-10-16 MED ORDER — MIDAZOLAM HCL 2 MG/2ML IJ SOLN
INTRAMUSCULAR | Status: AC
  Filled 2019-10-16: qty 2

## 2019-10-16 MED ORDER — FENTANYL CITRATE (PF) 100 MCG/2ML IJ SOLN
INTRAMUSCULAR | Status: AC
  Filled 2019-10-16: qty 2

## 2019-10-16 SURGICAL SUPPLY — 84 items
APL PRP STRL LF DISP 70% ISPRP (MISCELLANEOUS) ×1
BLADE HEX COATED 2.75 (ELECTRODE) ×2 IMPLANT
BLADE OSCILLATING/SAGITTAL (BLADE) ×2
BLADE SURG 15 STRL LF DISP TIS (BLADE) ×1 IMPLANT
BLADE SURG 15 STRL SS (BLADE) ×2
BLADE SW THK.38XMED LNG THN (BLADE) IMPLANT
BNDG CMPR 9X4 STRL LF SNTH (GAUZE/BANDAGES/DRESSINGS) ×1
BNDG CONFORM 4 STRL LF (GAUZE/BANDAGES/DRESSINGS) ×1 IMPLANT
BNDG ELASTIC 3X5.8 VLCR STR LF (GAUZE/BANDAGES/DRESSINGS) ×2 IMPLANT
BNDG ELASTIC 4X5.8 VLCR STR LF (GAUZE/BANDAGES/DRESSINGS) ×2 IMPLANT
BNDG ELASTIC 6X5.8 VLCR STR LF (GAUZE/BANDAGES/DRESSINGS) ×2 IMPLANT
BNDG ESMARK 4X9 LF (GAUZE/BANDAGES/DRESSINGS) ×2 IMPLANT
BNDG GAUZE ELAST 4 BULKY (GAUZE/BANDAGES/DRESSINGS) ×2 IMPLANT
CHLORAPREP W/TINT 26 (MISCELLANEOUS) ×2 IMPLANT
CLEANER TIP ELECTROSURG 2X2 (MISCELLANEOUS) ×2 IMPLANT
CONT SPEC 4OZ CLIKSEAL STRL BL (MISCELLANEOUS) ×2 IMPLANT
COVER BACK TABLE 60X90IN (DRAPES) ×2 IMPLANT
COVER MAYO STAND STRL (DRAPES) ×2 IMPLANT
COVER SURGICAL LIGHT HANDLE (MISCELLANEOUS) ×2 IMPLANT
COVER WAND RF STERILE (DRAPES) IMPLANT
CUFF TOURN SGL QUICK 18X4 (TOURNIQUET CUFF) IMPLANT
CUFF TOURN SGL QUICK 24 (TOURNIQUET CUFF) ×2
CUFF TRNQT CYL 24X4X16.5-23 (TOURNIQUET CUFF) ×1 IMPLANT
DECANTER SPIKE VIAL GLASS SM (MISCELLANEOUS) ×2 IMPLANT
DRAPE EXTREMITY T 121X128X90 (DISPOSABLE) ×2 IMPLANT
DRAPE IMP U-DRAPE 54X76 (DRAPES) ×2 IMPLANT
DRAPE U-SHAPE 47X51 STRL (DRAPES) ×2 IMPLANT
DRSG EMULSION OIL 3X3 NADH (GAUZE/BANDAGES/DRESSINGS) ×2 IMPLANT
DRSG PAD ABDOMINAL 8X10 ST (GAUZE/BANDAGES/DRESSINGS) IMPLANT
ELECT REM PT RETURN 15FT ADLT (MISCELLANEOUS) ×2 IMPLANT
GAUZE 4X4 16PLY RFD (DISPOSABLE) IMPLANT
GAUZE SPONGE 4X4 12PLY STRL (GAUZE/BANDAGES/DRESSINGS) ×4 IMPLANT
GAUZE XEROFORM 5X9 LF (GAUZE/BANDAGES/DRESSINGS) ×1 IMPLANT
GLOVE BIO SURGEON STRL SZ7.5 (GLOVE) ×2 IMPLANT
GLOVE BIOGEL PI IND STRL 8 (GLOVE) ×1 IMPLANT
GLOVE BIOGEL PI INDICATOR 8 (GLOVE) ×1
GOWN STRL REUS W/ TWL LRG LVL3 (GOWN DISPOSABLE) ×1 IMPLANT
GOWN STRL REUS W/ TWL XL LVL3 (GOWN DISPOSABLE) ×1 IMPLANT
GOWN STRL REUS W/TWL LRG LVL3 (GOWN DISPOSABLE) ×2
GOWN STRL REUS W/TWL XL LVL3 (GOWN DISPOSABLE) ×4 IMPLANT
KIT BASIN OR (CUSTOM PROCEDURE TRAY) ×2 IMPLANT
KIT STIMULAN RAPID CURE  10CC (Orthopedic Implant) ×1 IMPLANT
KIT STIMULAN RAPID CURE 10CC (Orthopedic Implant) IMPLANT
KIT TURNOVER KIT A (KITS) IMPLANT
MANIFOLD NEPTUNE II (INSTRUMENTS) ×2 IMPLANT
MARKER SKIN DUAL TIP RULER LAB (MISCELLANEOUS) ×2 IMPLANT
NDL HYPO 25X1 1.5 SAFETY (NEEDLE) ×1 IMPLANT
NEEDLE HYPO 25X1 1.5 SAFETY (NEEDLE) ×2 IMPLANT
NS IRRIG 1000ML POUR BTL (IV SOLUTION) IMPLANT
PACK ORTHO EXTREMITY (CUSTOM PROCEDURE TRAY) ×2 IMPLANT
PADDING CAST ABS 4INX4YD NS (CAST SUPPLIES) ×1
PADDING CAST ABS COTTON 4X4 ST (CAST SUPPLIES) ×1 IMPLANT
PADDING UNDERCAST 2 STRL (CAST SUPPLIES) ×1
PADDING UNDERCAST 2X4 STRL (CAST SUPPLIES) ×1 IMPLANT
PENCIL SMOKE EVACUATOR (MISCELLANEOUS) IMPLANT
SET INTERPULSE LAVAGE W/TIP (ORTHOPEDIC DISPOSABLE SUPPLIES) ×1 IMPLANT
SET IRRIG Y TYPE TUR BLADDER L (SET/KITS/TRAYS/PACK) IMPLANT
SLEEVE SCD COMPRESS KNEE MED (MISCELLANEOUS) ×2 IMPLANT
STAPLER VISISTAT 35W (STAPLE) ×1 IMPLANT
STOCKINETTE 4X48 STRL (DRAPES) ×2 IMPLANT
STOCKINETTE 6  STRL (DRAPES) ×1
STOCKINETTE 6 STRL (DRAPES) ×1 IMPLANT
SUT ETHILON 2 0 PS N (SUTURE) ×1 IMPLANT
SUT ETHILON 3 0 FSL (SUTURE) ×2 IMPLANT
SUT ETHILON 3 0 PS 1 (SUTURE) ×1 IMPLANT
SUT ETHILON 4 0 PS 2 18 (SUTURE) IMPLANT
SUT MNCRL AB 3-0 PS2 18 (SUTURE) IMPLANT
SUT MNCRL AB 4-0 PS2 18 (SUTURE) IMPLANT
SUT MON AB 5-0 PS2 18 (SUTURE) IMPLANT
SUT PROLENE 4 0 PS 2 18 (SUTURE) ×2 IMPLANT
SUT VIC AB 1 CT1 27 (SUTURE) ×2
SUT VIC AB 1 CT1 27XBRD ANTBC (SUTURE) ×1 IMPLANT
SUT VIC AB 3-0 FS2 27 (SUTURE) ×2 IMPLANT
SUT VIC AB 4-0 PS2 18 (SUTURE) ×1 IMPLANT
SUT VIC AB 4-0 PS2 27 (SUTURE) ×1 IMPLANT
SUT VICRYL 4-0 PS2 18IN ABS (SUTURE) ×2 IMPLANT
SYR 20ML LL LF (SYRINGE) ×2 IMPLANT
SYR BULB 3OZ (MISCELLANEOUS) ×2 IMPLANT
SYR CONTROL 10ML LL (SYRINGE) ×2 IMPLANT
TOWEL OR 17X26 10 PK STRL BLUE (TOWEL DISPOSABLE) ×2 IMPLANT
TOWEL OR NON WOVEN STRL DISP B (DISPOSABLE) ×2 IMPLANT
TRAY PREP A LATEX SAFE STRL (SET/KITS/TRAYS/PACK) ×2 IMPLANT
UNDERPAD 30X36 HEAVY ABSORB (UNDERPADS AND DIAPERS) ×8 IMPLANT
YANKAUER SUCT BULB TIP NO VENT (SUCTIONS) ×2 IMPLANT

## 2019-10-16 NOTE — Anesthesia Preprocedure Evaluation (Addendum)
Anesthesia Evaluation  Patient identified by MRN, date of birth, ID band Patient awake    Reviewed: Allergy & Precautions, NPO status , Patient's Chart, lab work & pertinent test results  Airway Mallampati: II  TM Distance: >3 FB Neck ROM: Full    Dental  (+) Poor Dentition, Dental Advisory Given,  Extremely poor dentition:   Pulmonary asthma , COPD, former smoker,  Quit smoking 1985- 62.5 pack year history  Uses rescue inhaler daily- uses 3-4x/d   Pulmonary exam normal breath sounds clear to auscultation       Cardiovascular hypertension, Pt. on medications + Peripheral Vascular Disease and +CHF  Normal cardiovascular exam Rhythm:Regular Rate:Normal  ECG: SR, rate 77  Last echo 2015: Mild LVH with LVEF 55-60%, grade 2  diastolic dysfunction. Mild MAC with trivial mtiral  regurgitation. Mildly sclerotic aortic valve. Mildly  reduced RV contraction. Unable to assess PASP. No  pericardial effusion.    Neuro/Psych PSYCHIATRIC DISORDERS Anxiety Depression Essential tremor  Neuromuscular disease    GI/Hepatic PUD, GERD  Medicated,(+)     substance abuse  alcohol use,   Endo/Other  negative endocrine ROSdiabetes  Renal/GU negative Renal ROS  negative genitourinary   Musculoskeletal  (+) Arthritis , narcotic dependentOsteomyelitis of great toe s/p amp   Abdominal (+) + obese,   Peds  Hematology  (+) anemia , HLD   Anesthesia Other Findings   Reproductive/Obstetrics negative OB ROS                           Anesthesia Physical Anesthesia Plan  ASA: III  Anesthesia Plan: MAC   Post-op Pain Management:    Induction:   PONV Risk Score and Plan: 1 and Ondansetron, Treatment may vary due to age or medical condition, Propofol infusion and TIVA  Airway Management Planned: Natural Airway and Simple Face Mask  Additional Equipment: None  Intra-op Plan:   Post-operative Plan:    Informed Consent: I have reviewed the patients History and Physical, chart, labs and discussed the procedure including the risks, benefits and alternatives for the proposed anesthesia with the patient or authorized representative who has indicated his/her understanding and acceptance.       Plan Discussed with: CRNA  Anesthesia Plan Comments:        Anesthesia Quick Evaluation

## 2019-10-16 NOTE — Progress Notes (Signed)
[facility]                                                                              Patient Demographics  Eugene Diaz, is a 53 y.o. male, DOB - September 01, 1966, [id number]  Admit date - 10/11/2019   Admitting Physician Etta Quill, DO  Outpatient Primary MD for the patient is Ginger Organ  Outpatient specialists:   LOS - 5  days   Medical records reviewed and are as summarized below:    No chief complaint on file.      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   Per prior note:On 9/15 patient hadOpen reduction, perc pinning left hallux fracture, incision and drainage left hallux. On 10/14 patient had amputationleft hallux. Cultures grew MSSA On 11/4 had repair of complex dehiscence. Cultures grew Pseudomonas. He was started on cefepime on admission.  Status post left foot I&D, left partial first ray amputation with [name] on 12/13   Assessment & Plan    Principal Problem:   Acute osteomyelitis of toe of left foot ([place]) -Cultures grew Pseudomonas in 09/2019, MSSA in October.  Patient was placed on cefepime on admission, received 1 dose of IV vancomycin -MRI of the left foot on 12/11 showed acute osteomyelitis of the first metatarsal head and diaphoresis with edema, enhancement proximally to the level of the first metatarsal base.  Complex rim-enhancing fluid suggestive of 2.6 cm abscess at the distal aspect of the first metatarsal base, rim-enhancing fluid collection in second MTP joint, suggestive of septic arthritis.  Marrow edema in the second metatarsal and second digit proximal phalanx suggestive of acute osteomyelitis. -Podiatry consulted, status post left foot I&D, left  partial first ray amputation on 12/13 -OR today for  left foot wound debridement again, partial second ray resection, wound closure  Active Problems:   Hypertension -Continue lisinopril, home dose of Lasix   Diabetes mellitus type 2 -Hemoglobin A1c 5.3, continue sliding scale insulin  Chronic neuropathic pain -Continue Neurontin  Hyperlipidemia -Continue Lipitor  Obesity -BMI 35.3, counseled on diet and weight control  Code Status: full code  DVT Prophylaxis:  SCD's Family Communication: Discussed all imaging results, lab results, explained to the patient    Disposition Plan: When cleared by podiatry, going for OR today  Time Spent in minutes 25 minutes   Procedures: 10/13/2019             1) Left Foot Incision and Drainage below the deep fascia             2) Left Partial 1st Ray amputation**  Consultants:   podiatry  Antimicrobials:   Anti-infectives (From admission, onward)   Start     Dose/Rate Route Frequency Ordered Stop   10/13/19 1003  vancomycin (VANCOCIN) powder  Status:  Discontinued       As needed 10/13/19 1003 10/13/19 1147   10/12/19 0600  [  MAR Hold]  ceFEPIme (MAXIPIME) 2 g in sodium chloride 0.9 % 100 mL IVPB     (MAR Hold since Wed 10/16/2019 at 1308.Hold Reason: Transfer to a Procedural area.)   2 g 200 mL/hr over 30 Minutes Intravenous Every 8 hours 10/11/19 2054     10/11/19 2015  [MAR Hold]  ceFEPIme (MAXIPIME) 2 g in sodium chloride 0.9 % 100 mL IVPB     (MAR Hold since Wed 10/16/2019 at 1308.Hold Reason: Transfer to a Procedural area.)   2 g 200 mL/hr over 30 Minutes Intravenous  Once 10/11/19 2008            Medications  Scheduled Meds:  [MAR Hold] amitriptyline  100 mg Oral QHS   [MAR Hold] atorvastatin  40 mg Oral QPM   [MAR Hold] DULoxetine  60 mg Oral Daily   [MAR Hold] feeding supplement (ENSURE ENLIVE)  237 mL Oral BID BM   [MAR Hold] folic acid  1 mg Oral Daily   [MAR Hold] furosemide  20 mg Oral Daily   [MAR  Hold] gabapentin  300 mg Oral BID WC   [MAR Hold] gabapentin  600 mg Oral QHS   [MAR Hold] glycopyrrolate  1 mg Oral BID   [MAR Hold] insulin aspart  0-5 Units Subcutaneous QHS   [MAR Hold] insulin aspart  0-9 Units Subcutaneous TID WC   [MAR Hold] lisinopril  20 mg Oral Daily   [MAR Hold] multivitamin with minerals  1 tablet Oral Daily   [MAR Hold] nutrition supplement (JUVEN)  1 packet Oral BID BM   [MAR Hold] pantoprazole  40 mg Oral BID   [MAR Hold] primidone  50 mg Oral BID   [MAR Hold] sodium chloride flush  10-40 mL Intracatheter Q12H   [MAR Hold] thiamine  100 mg Oral Daily   Or   [MAR Hold] thiamine  100 mg Intravenous Daily   Continuous Infusions:  [MAR Hold] sodium chloride 1,000 mL (10/13/19 0520)   [MAR Hold] ceFEPime (MAXIPIME) IV     [MAR Hold] ceFEPime (MAXIPIME) IV 2 g (10/16/19 0502)   lactated ringers 50 mL/hr at 10/16/19 1310   PRN Meds:.[MAR Hold] sodium chloride, [MAR Hold] acetaminophen **OR** [MAR Hold] acetaminophen, [MAR Hold] albuterol, [MAR Hold] docusate sodium, [MAR Hold] hydrOXYzine, [MAR Hold] ondansetron **OR** [MAR Hold] ondansetron (ZOFRAN) IV, [MAR Hold] oxyCODONE, [MAR Hold] sodium chloride flush      Subjective:   Michiel Loken was seen and examined today.  N.p.o., feeling hungry and awaiting for his surgery.  Patient denies dizziness, chest pain, shortness of breath, abdominal pain, N/V/D/C, new weakness, numbess, tingling. No acute events overnight.    Objective:   Vitals:   10/15/19 2140 10/16/19 0424 10/16/19 0902 10/16/19 1310  BP: 120/80 127/73 108/77 125/78  Pulse: 73 79 75 76  Resp: 16 18  17   Temp: 97.9 F (36.6 C) 98 F (36.7 C)  98.6 F (37 C)  TempSrc: Oral Oral  Oral  SpO2: 97% 97%  98%  Weight:    124.7 kg  Height:    6\' 2"  (1.88 m)    Intake/Output Summary (Last 24 hours) at 10/16/2019 1424 Last data filed at 10/16/2019 0934 Gross per 24 hour  Intake 540 ml  Output 2025 ml  Net -1485 ml      Wt Readings from Last 3 Encounters:  10/16/19 124.7 kg  09/04/19 125.7 kg  08/13/19 130.2 kg     Exam  General: Alert and oriented x 3, NAD  Eyes:  HEENT:  Atraumatic, normocephalic  Cardiovascular: S1 S2 auscultated, no murmurs, RRR  Respiratory: Clear to auscultation bilaterally, no wheezing, rales or rhonchi  Gastrointestinal: Soft, nontender, nondistended, + bowel sounds  Ext: Dressing intact on the left foot  Neuro: No new deficits  Musculoskeletal: No digital cyanosis, clubbing  Skin: Dressing intact on the left foot  Psych: Normal affect and demeanor, alert and oriented x3    Data Reviewed:  I have personally reviewed following labs and imaging studies  Micro Results Recent Results (from the past 240 hour(s))  Blood Cultures x 2 sites     Status: None (Preliminary result)   Collection Time: 10/11/19 10:17 PM   Specimen: BLOOD LEFT HAND  Result Value Ref Range Status   Specimen Description   Final    BLOOD LEFT HAND Performed at [place] [address], [place], [place] [address]    Special Requests   Final    BOTTLES DRAWN AEROBIC ONLY Blood Culture adequate volume Performed at [place] [address] [place], [place] [address]    Culture   Final    NO GROWTH 4 DAYS Performed at [facility], [place] [address], [place], [place] [address]    Report Status PENDING  Incomplete  Blood Cultures x 2 sites     Status: None (Preliminary result)   Collection Time: 10/11/19 10:17 PM   Specimen: BLOOD  Result Value Ref Range Status   Specimen Description   Final    BLOOD LEFT ANTECUBITAL Performed at [place] [address] [place], [place] [address]    Special Requests   Final    BOTTLES DRAWN AEROBIC ONLY Blood Culture adequate volume Performed at [place] [place] [address], [place], [place] [address]    Culture   Final    NO GROWTH 4  DAYS Performed at [facility], [place] [address] [place], [place] [address]    Report Status PENDING  Incomplete  SARS CORONAVIRUS 2 (TAT 6-24 HRS) Nasopharyngeal Nasopharyngeal Swab     Status: None   Collection Time: 10/12/19  1:47 AM   Specimen: Nasopharyngeal Swab  Result Value Ref Range Status   SARS Coronavirus 2 NEGATIVE NEGATIVE Final    Comment: (NOTE) SARS-CoV-2 target nucleic acids are NOT DETECTED. The SARS-CoV-2 RNA is generally detectable in upper and lower respiratory specimens during the acute phase of infection. Negative results do not preclude SARS-CoV-2 infection, do not rule out co-infections with other pathogens, and should not be used as the sole basis for treatment or other patient management decisions. Negative results must be combined with clinical observations, patient history, and epidemiological information. The expected result is Negative. Fact Sheet for Patients: SugarRoll.be Fact Sheet for Healthcare Providers: https://www.woods-mathews.com/ This test is not yet approved or cleared by the [place] FDA and  has been authorized for detection and/or diagnosis of SARS-CoV-2 by FDA under an Emergency Use Authorization (EUA). This EUA will remain  in effect (meaning this test can be used) for the duration of the COVID-19 declaration under Section 56 4(b)(1) of the Act, 21 U.S.C. section 360bbb-3(b)(1), unless the authorization is terminated or revoked sooner. Performed at [facility], [place] [address], [place], [place] [address]   Surgical pcr screen     Status: None   Collection Time: 10/12/19  6:30 AM   Specimen: Nasal Mucosa; Nasal Swab  Result Value Ref Range Status   MRSA, PCR NEGATIVE NEGATIVE Final  Staphylococcus aureus NEGATIVE NEGATIVE Final    Comment: (NOTE) The Xpert SA Assay (FDA approved for NASAL specimens in patients 37 years of age and older), is one component of a  comprehensive surveillance program. It is not intended to diagnose infection nor to guide or monitor treatment. Performed at [facility], [place] [address] [place], [place] [address]   Fungus Culture With Stain     Status: None   Collection Time: 10/13/19 10:04 AM   Specimen: PATH Amputaion Arm/Leg; Tissue  Result Value Ref Range Status   Fungus Stain Final report  Final    Comment: (NOTE) Performed At: [facility] [place], [place] [id number] Rush Farmer MD [id number]    Fungus (Mycology) Culture PENDING  Incomplete   Fungal Source WOUND  Corrected    Comment: LEFT FOOT Performed at [facility], [place] [address] [place], [place] [address] CORRECTED ON 12/13 AT 1553: PREVIOUSLY REPORTED AS ARM   Aerobic/Anaerobic Culture (surgical/deep wound)     Status: None (Preliminary result)   Collection Time: 10/13/19 10:04 AM   Specimen: PATH Amputaion Arm/Leg; Tissue  Result Value Ref Range Status   Specimen Description WOUND LEFT FOOT  Final   Special Requests SPEC A ON SWABS  Final   Gram Stain   Final    RARE WBC PRESENT, PREDOMINANTLY PMN FEW GRAM POSITIVE COCCI RARE GRAM POSITIVE RODS    Culture   Final    ABUNDANT STAPHYLOCOCCUS AUREUS WITHIN MIXED ORGANISMS NO ANAEROBES ISOLATED; CULTURE IN PROGRESS FOR 5 DAYS    Report Status PENDING  Incomplete   Organism ID, Bacteria STAPHYLOCOCCUS AUREUS  Final      Susceptibility   Staphylococcus aureus - MIC*    CIPROFLOXACIN <=0.5 SENSITIVE Sensitive     ERYTHROMYCIN <=0.25 SENSITIVE Sensitive     GENTAMICIN <=0.5 SENSITIVE Sensitive     OXACILLIN 0.5 SENSITIVE Sensitive     TETRACYCLINE <=1 SENSITIVE Sensitive     VANCOMYCIN 1 SENSITIVE Sensitive     TRIMETH/SULFA <=10 SENSITIVE Sensitive     CLINDAMYCIN <=0.25 SENSITIVE Sensitive     RIFAMPIN <=0.5 SENSITIVE Sensitive     Inducible Clindamycin Value in next row Sensitive      NEGATIVEPerformed at [place] [address], [place], [place] [address]    * ABUNDANT STAPHYLOCOCCUS AUREUS  Fungus Culture Result     Status: None   Collection Time: 10/13/19 10:04 AM  Result Value Ref Range Status   Result 1 Comment  Final    Comment: (NOTE) KOH/Calcofluor preparation:  no fungus observed. Performed At: [facility] [place], [place] [id number] Rush Farmer MD [id number]   Fungus Culture With Stain     Status: None   Collection Time: 10/13/19 10:07 AM   Specimen: PATH Digit amputation; Tissue  Result Value Ref Range Status   Fungus Stain Final report  Final    Comment: (NOTE) Performed At: [facility] [place], [place] [id number] Rush Farmer MD [id number]    Fungus (Mycology) Culture PENDING  Incomplete   Fungal Source BONE  Corrected    Comment: LEFT FOOT 1ST METATARSAL Performed at [facility], [place] [address] [place], [place] [address] CORRECTED ON 12/13 AT 1553: PREVIOUSLY REPORTED AS FOOT LEFT   Aerobic/Anaerobic Culture (surgical/deep wound)     Status: None (Preliminary result)   Collection Time: 10/13/19 10:07 AM   Specimen: PATH Digit amputation; Tissue  Result Value Ref Range  Status   Specimen Description   Final    LEFT FOOT 1ST METATARSAL Performed at [facility], [place] [address], [place], [place] [address]    Special Requests   Final    NONE Performed at [facility], [place] [address] [place], [place] [address]    Gram Stain   Final    NO WBC SEEN NO ORGANISMS SEEN Performed at [facility], [place] [address] [place], [place] [address]    Culture   Final    RARE STAPHYLOCOCCUS AUREUS WITHIN MIXED ORGANISMS NO ANAEROBES ISOLATED; CULTURE IN PROGRESS FOR 5 DAYS    Report Status PENDING  Incomplete   Organism ID, Bacteria STAPHYLOCOCCUS AUREUS  Final      Susceptibility   Staphylococcus aureus - MIC*    CIPROFLOXACIN <=0.5 SENSITIVE Sensitive     ERYTHROMYCIN <=0.25 SENSITIVE  Sensitive     GENTAMICIN <=0.5 SENSITIVE Sensitive     OXACILLIN 0.5 SENSITIVE Sensitive     TETRACYCLINE <=1 SENSITIVE Sensitive     VANCOMYCIN <=0.5 SENSITIVE Sensitive     TRIMETH/SULFA <=10 SENSITIVE Sensitive     CLINDAMYCIN <=0.25 SENSITIVE Sensitive     RIFAMPIN <=0.5 SENSITIVE Sensitive     Inducible Clindamycin NEGATIVE Sensitive     * RARE STAPHYLOCOCCUS AUREUS  Fungus Culture Result     Status: None   Collection Time: 10/13/19 10:07 AM  Result Value Ref Range Status   Result 1 Comment  Final    Comment: (NOTE) KOH/Calcofluor preparation:  no fungus observed. Performed At: [facility] [place], [place] [id number] Rush Farmer MD [id number]     Radiology Reports MR FOOT LEFT W WO CONTRAST  Result Date: 10/11/2019 CLINICAL DATA:  Osteomyelitis. Recent amputation of the left great toe EXAM: MRI OF THE LEFT FOREFOOT WITHOUT AND WITH CONTRAST TECHNIQUE: Multiplanar, multisequence MR imaging of the left forefoot was performed both before and after administration of intravenous contrast. CONTRAST:  32mL GADAVIST GADOBUTROL 1 MMOL/ML IV SOLN COMPARISON:  X-ray 10/11/2019 FINDINGS: Bones/Joint/Cartilage Amputation of the left great toe at the first MTP joint. Erosive changes of the first metatarsal head with confluent low T1 signal changes extending approximately 2.5 cm proximally into the diaphysis. There is extensive bone marrow edema and enhancement throughout the first metatarsal extending to the base. Dorsal dislocation at the second MTP joint with marrow edema throughout the second metatarsal and second digit proximal phalanx. Patchy low T1 signal changes in the second metatarsal head (series 4, images 8-10). There is mild patchy marrow edema in the third metatarsal head with subtle low T1 signal change. Remaining osseous structures maintain fatty T1 marrow signal. Ligaments LisFranc ligament intact. Muscles and Tendons Findings of diffuse myositis and  chronic denervation changes. No tenosynovial fluid collection. Flexor and extensor tendons of the great toe are discontinuous, likely postsurgical. Soft tissues Rim enhancing fluid collection overlying the distal aspect of the first metatarsal head measuring approximately 2.6 x 0.8 cm (series 9, image 12). Fluid extends along the plantar aspect of the first metatarsal neck. Enhancing fluid collection extends into the intermetatarsal space and second MTP joint concerning for septic arthritis. Extensive soft tissue thickening and edema about the forefoot and amputation site. Soft tissue thickening and metallic susceptibility artifact at the surgical site overlying the great toe. IMPRESSION: 1. Acute osteomyelitis of the first metatarsal head and diaphysis. Bone marrow edema and enhancement extend proximally to the level of the first metatarsal base. There is complex rim enhancing  fluid collection over the distal aspect of the first metatarsal base extending more proximally along its plantar aspect measuring up to 2.6 cm suggestive of abscess. 2. Rim enhancing fluid collection at the second MTP joint, which is dorsally dislocated, suggesting septic arthritis. 3. Marrow edema within the second metatarsal and second digit proximal phalanx where there is low signal changes at the second metatarsal head suggesting acute osteomyelitis. 4. Marrow edema at the third metatarsal head without definite confluent low T1 signal change to suggest acute osteomyelitis. 5. Findings of diffuse myositis and chronic denervation changes. These results will be called to the ordering clinician or representative by the Radiologist Assistant, and communication documented in the PACS or zVision Dashboard. Electronically Signed   By: Davina Poke M.D.   On: 10/11/2019 22:33   DG Foot 2 Views Left  Result Date: 10/13/2019 CLINICAL DATA:  Postoperative EXAM: LEFT FOOT - 2 VIEW COMPARISON:  10/11/2019 FINDINGS: Interval postoperative  findings of revision transmetatarsal amputation of the left first digit. Osseous structures are otherwise intact. Joint spaces are well preserved. Bandage material about the surgical site. IMPRESSION: Interval postoperative findings of revision transmetatarsal amputation of the left first digit. Electronically Signed   By: Eddie Candle M.D.   On: 10/13/2019 13:26   DG Foot Complete Left  Result Date: 10/14/2019 Please see detailed radiograph report in office note.  DG Foot Complete Left  Result Date: 10/09/2019 Please see detailed radiograph report in office note.   Lab Data:  CBC: Recent Labs  Lab 10/11/19 2015 10/12/19 0447 10/13/19 0452 10/14/19 0429 10/15/19 0521 10/16/19 0347  WBC 5.6 5.0 4.9 6.3 4.9 5.1  NEUTROABS 3.9  --   --   --   --   --   HGB 9.0* 10.0* 9.1* 9.5* 8.6* 8.6*  HCT 28.4* 31.3* 28.9* 30.0* 26.7* 27.6*  MCV 96.9 97.8 96.7 98.0 96.7 97.9  PLT 127* 121* 133* 137* 131* [id number]   Basic Metabolic Panel: Recent Labs  Lab 10/11/19 2015 10/12/19 0447 10/13/19 0452 10/14/19 0429 10/15/19 0521 10/16/19 0347  NA  --  141 136 131* 132* 135  K  --  4.5 4.3 4.2 4.3 4.5  CL  --  104 102 98 98 101  CO2  --  28 27 25 26 26   GLUCOSE  --  171* 166* 133* 172* 181*  BUN  --  15 16 18 20  32*  CREATININE  --  0.94 0.86 0.90 0.94 1.07  CALCIUM  --  9.5 9.0 9.1 9.1 9.1  MG 2.1  --   --   --   --   --   PHOS 3.2  --   --   --   --   --    GFR: Estimated Creatinine Clearance: 110.7 mL/min (by C-G formula based on SCr of 1.07 mg/dL). Liver Function Tests: Recent Labs  Lab 10/11/19 2016  AST 13*  ALT 12  ALKPHOS 85  BILITOT 0.8  PROT 7.0  ALBUMIN 3.5   No results for input(s): LIPASE, AMYLASE in the last 168 hours. No results for input(s): AMMONIA in the last 168 hours. Coagulation Profile: Recent Labs  Lab 10/11/19 2015  INR 1.0   Cardiac Enzymes: No results for input(s): CKTOTAL, CKMB, CKMBINDEX, TROPONINI in the last 168 hours. BNP (last 3 results) No  results for input(s): PROBNP in the last 8760 hours. HbA1C: No results for input(s): HGBA1C in the last 72 hours. CBG: Recent Labs  Lab 10/15/19 1152 10/15/19 1647 10/15/19 2137  10/16/19 0746 10/16/19 1142  GLUCAP 142* 148* 137* 139* 116*   Lipid Profile: No results for input(s): CHOL, HDL, LDLCALC, TRIG, CHOLHDL, LDLDIRECT in the last 72 hours. Thyroid Function Tests: No results for input(s): TSH, T4TOTAL, FREET4, T3FREE, THYROIDAB in the last 72 hours. Anemia Panel: No results for input(s): VITAMINB12, FOLATE, FERRITIN, TIBC, IRON, RETICCTPCT in the last 72 hours. Urine analysis:    Component Value Date/Time   COLORURINE YELLOW 08/14/2019 1232   APPEARANCEUR CLEAR 08/14/2019 1232   LABSPEC 1.020 08/14/2019 1232   PHURINE 5.0 08/14/2019 1232   GLUCOSEU NEGATIVE 08/14/2019 1232   HGBUR NEGATIVE 08/14/2019 [place] 08/14/2019 [place] 08/14/2019 1232   PROTEINUR NEGATIVE 08/14/2019 1232   UROBILINOGEN 4.0 (H) 10/09/2008 1114   NITRITE NEGATIVE 08/14/2019 [place] 08/14/2019 1232     Ayuub Penley M.D. [facility] 10/16/2019, 2:24 PM   Call night coverage person covering after 7pm

## 2019-10-16 NOTE — Progress Notes (Signed)
Subjective:  Patient ID: Eugene Diaz, male    DOB: 04-Oct-1966,  MRN: [id number]  Seen in pre-op. Understands plan for OR. Denies concerns. Objective:   Vitals:   10/16/19 0902 10/16/19 1310  BP: 108/77 125/78  Pulse: 75 76  Resp:  17  Temp:  98.6 F (37 C)  SpO2:  98%   General AA&O x3. Normal mood and affect.  Vascular Foot warm and well perfused intact capillary refill to the digits.  Neurologic Epicritic sensation grossly absent.  Dermatologic Dressing intact LLE  Orthopedic: Left hallux amputation noted.   Results for orders placed or performed during the hospital encounter of 10/11/19 (from the past 24 hour(s))  Glucose, capillary     Status: Abnormal   Collection Time: 10/15/19  4:47 PM  Result Value Ref Range   Glucose-Capillary 148 (H) 70 - 99 mg/dL  Glucose, capillary     Status: Abnormal   Collection Time: 10/15/19  9:37 PM  Result Value Ref Range   Glucose-Capillary 137 (H) 70 - 99 mg/dL  AM BMP     Status: Abnormal   Collection Time: 10/16/19  3:47 AM  Result Value Ref Range   Sodium 135 135 - 145 mmol/L   Potassium 4.5 3.5 - 5.1 mmol/L   Chloride 101 98 - 111 mmol/L   CO2 26 22 - 32 mmol/L   Glucose, Bld 181 (H) 70 - 99 mg/dL   BUN 32 (H) 6 - 20 mg/dL   Creatinine, Ser 1.07 0.61 - 1.24 mg/dL   Calcium 9.1 8.9 - 10.3 mg/dL   GFR calc non Af Amer >60 >60 mL/min   GFR calc Af Amer >60 >60 mL/min   Anion gap 8 5 - 15  AM CBC     Status: Abnormal   Collection Time: 10/16/19  3:47 AM  Result Value Ref Range   WBC 5.1 4.0 - 10.5 K/uL   RBC 2.82 (L) 4.22 - 5.81 MIL/uL   Hemoglobin 8.6 (L) 13.0 - 17.0 g/dL   HCT 27.6 (L) 39.0 - 52.0 %   MCV 97.9 80.0 - 100.0 fL   MCH 30.5 26.0 - 34.0 pg   MCHC 31.2 30.0 - 36.0 g/dL   RDW 14.7 11.5 - 15.5 %   Platelets 164 150 - 400 K/uL   nRBC 0.0 0.0 - 0.2 %  Glucose, capillary     Status: Abnormal   Collection Time: 10/16/19  7:46 AM  Result Value Ref Range   Glucose-Capillary 139 (H) 70 - 99 mg/dL  Glucose,  capillary     Status: Abnormal   Collection Time: 10/16/19 11:42 AM  Result Value Ref Range   Glucose-Capillary 116 (H) 70 - 99 mg/dL   Results for orders placed or performed during the hospital encounter of 10/11/19  Blood Cultures x 2 sites     Status: None (Preliminary result)   Collection Time: 10/11/19 10:17 PM   Specimen: BLOOD LEFT HAND  Result Value Ref Range Status   Specimen Description   Final    BLOOD LEFT HAND Performed at [facility], [place] [address] [place], [place] [address]    Special Requests   Final    BOTTLES DRAWN AEROBIC ONLY Blood Culture adequate volume Performed at [facility] [address] [place], [place] [address]    Culture   Final    NO GROWTH 4 DAYS Performed at [facility], [place] [address], [place], [place] [address]    Report Status  PENDING  Incomplete  Blood Cultures x 2 sites     Status: None (Preliminary result)   Collection Time: 10/11/19 10:17 PM   Specimen: BLOOD  Result Value Ref Range Status   Specimen Description   Final    BLOOD LEFT ANTECUBITAL Performed at [place] [address], [place], [place] [address]    Special Requests   Final    BOTTLES DRAWN AEROBIC ONLY Blood Culture adequate volume Performed at [place] [address], [place], [place] [address]    Culture   Final    NO GROWTH 4 DAYS Performed at [facility], [place] [address], [place], [place] [address]    Report Status PENDING  Incomplete  SARS CORONAVIRUS 2 (TAT 6-24 HRS) Nasopharyngeal Nasopharyngeal Swab     Status: None   Collection Time: 10/12/19  1:47 AM   Specimen: Nasopharyngeal Swab  Result Value Ref Range Status   SARS Coronavirus 2 NEGATIVE NEGATIVE Final    Comment: (NOTE) SARS-CoV-2 target nucleic acids are NOT DETECTED. The SARS-CoV-2 RNA is generally detectable in upper and lower respiratory specimens during the acute phase of infection.  Negative results do not preclude SARS-CoV-2 infection, do not rule out co-infections with other pathogens, and should not be used as the sole basis for treatment or other patient management decisions. Negative results must be combined with clinical observations, patient history, and epidemiological information. The expected result is Negative. Fact Sheet for Patients: SugarRoll.be Fact Sheet for Healthcare Providers: https://www.woods-mathews.com/ This test is not yet approved or cleared by the [place] FDA and  has been authorized for detection and/or diagnosis of SARS-CoV-2 by FDA under an Emergency Use Authorization (EUA). This EUA will remain  in effect (meaning this test can be used) for the duration of the COVID-19 declaration under Section 56 4(b)(1) of the Act, 21 U.S.C. section 360bbb-3(b)(1), unless the authorization is terminated or revoked sooner. Performed at [facility], [place] [address], [place], [place] [address]   Surgical pcr screen     Status: None   Collection Time: 10/12/19  6:30 AM   Specimen: Nasal Mucosa; Nasal Swab  Result Value Ref Range Status   MRSA, PCR NEGATIVE NEGATIVE Final   Staphylococcus aureus NEGATIVE NEGATIVE Final    Comment: (NOTE) The Xpert SA Assay (FDA approved for NASAL specimens in patients 45 years of age and older), is one component of a comprehensive surveillance program. It is not intended to diagnose infection nor to guide or monitor treatment. Performed at [facility], [place] [address], [place], [place] [address]   Fungus Culture With Stain     Status: None   Collection Time: 10/13/19 10:04 AM   Specimen: PATH Amputaion Arm/Leg; Tissue  Result Value Ref Range Status   Fungus Stain Final report  Final    Comment: (NOTE) Performed At: [facility] [place], [place] [id number] Rush Farmer MD [id number]    Fungus (Mycology) Culture  PENDING  Incomplete   Fungal Source WOUND  Corrected    Comment: LEFT FOOT Performed at [facility], [place] [address], [place], [place] [address] CORRECTED ON 12/13 AT 1553: PREVIOUSLY REPORTED AS ARM   Aerobic/Anaerobic Culture (surgical/deep wound)     Status: None (Preliminary result)   Collection Time: 10/13/19 10:04 AM   Specimen: PATH Amputaion Arm/Leg; Tissue  Result Value Ref Range Status   Specimen Description WOUND LEFT FOOT  Final   Special Requests SPEC  A ON SWABS  Final   Gram Stain   Final    RARE WBC PRESENT, PREDOMINANTLY PMN FEW GRAM POSITIVE COCCI RARE GRAM POSITIVE RODS    Culture   Final    ABUNDANT STAPHYLOCOCCUS AUREUS WITHIN MIXED ORGANISMS NO ANAEROBES ISOLATED; CULTURE IN PROGRESS FOR 5 DAYS    Report Status PENDING  Incomplete   Organism ID, Bacteria STAPHYLOCOCCUS AUREUS  Final      Susceptibility   Staphylococcus aureus - MIC*    CIPROFLOXACIN <=0.5 SENSITIVE Sensitive     ERYTHROMYCIN <=0.25 SENSITIVE Sensitive     GENTAMICIN <=0.5 SENSITIVE Sensitive     OXACILLIN 0.5 SENSITIVE Sensitive     TETRACYCLINE <=1 SENSITIVE Sensitive     VANCOMYCIN 1 SENSITIVE Sensitive     TRIMETH/SULFA <=10 SENSITIVE Sensitive     CLINDAMYCIN <=0.25 SENSITIVE Sensitive     RIFAMPIN <=0.5 SENSITIVE Sensitive     Inducible Clindamycin Value in next row Sensitive      NEGATIVEPerformed at [facility] [address], [place], [place] [address]    * ABUNDANT STAPHYLOCOCCUS AUREUS  Fungus Culture Result     Status: None   Collection Time: 10/13/19 10:04 AM  Result Value Ref Range Status   Result 1 Comment  Final    Comment: (NOTE) KOH/Calcofluor preparation:  no fungus observed. Performed At: [facility] [place], [place] [id number] Rush Farmer MD [id number]   Fungus Culture With Stain     Status: None   Collection Time: 10/13/19 10:07 AM   Specimen: PATH Digit amputation; Tissue  Result Value Ref Range Status   Fungus  Stain Final report  Final    Comment: (NOTE) Performed At: [facility] [place], [place] [id number] Rush Farmer MD [id number]    Fungus (Mycology) Culture PENDING  Incomplete   Fungal Source BONE  Corrected    Comment: LEFT FOOT 1ST METATARSAL Performed at [facility], [place] [address], [place], [place] [address] CORRECTED ON 12/13 AT 1553: PREVIOUSLY REPORTED AS FOOT LEFT   Aerobic/Anaerobic Culture (surgical/deep wound)     Status: None (Preliminary result)   Collection Time: 10/13/19 10:07 AM   Specimen: PATH Digit amputation; Tissue  Result Value Ref Range Status   Specimen Description   Final    LEFT FOOT 1ST METATARSAL Performed at [facility], [place] [address] [place], [place] [address]    Special Requests   Final    NONE Performed at [facility], [place] [address], [place], [place] [address]    Gram Stain   Final    NO WBC SEEN NO ORGANISMS SEEN Performed at [facility], [place] [address], [place], [place] [address]    Culture   Final    RARE STAPHYLOCOCCUS AUREUS WITHIN MIXED ORGANISMS NO ANAEROBES ISOLATED; CULTURE IN PROGRESS FOR 5 DAYS    Report Status PENDING  Incomplete   Organism ID, Bacteria STAPHYLOCOCCUS AUREUS  Final      Susceptibility   Staphylococcus aureus - MIC*    CIPROFLOXACIN <=0.5 SENSITIVE Sensitive     ERYTHROMYCIN <=0.25 SENSITIVE Sensitive     GENTAMICIN <=0.5 SENSITIVE Sensitive     OXACILLIN 0.5 SENSITIVE Sensitive     TETRACYCLINE <=1 SENSITIVE Sensitive     VANCOMYCIN <=0.5 SENSITIVE Sensitive     TRIMETH/SULFA <=10 SENSITIVE Sensitive     CLINDAMYCIN <=0.25 SENSITIVE Sensitive     RIFAMPIN <=0.5 SENSITIVE Sensitive     Inducible Clindamycin  NEGATIVE Sensitive     * RARE STAPHYLOCOCCUS AUREUS  Fungus Culture Result     Status: None   Collection Time: 10/13/19 10:07 AM  Result Value Ref Range Status   Result 1 Comment  Final    Comment: (NOTE) KOH/Calcofluor  preparation:  no fungus observed. Performed At: [facility] [place], [place] [id number] Rush Farmer MD [id number]     Assessment & Plan:  Patient was evaluated and treated and all questions answered.  Left Foot Osteomyelitis, abscess -Micro bone pending - with rare Staph and other mixed organisms. -Continue empirics until we can tailor based upon cultures -Orders placed for dressing change. Wound flushed and packed today. To be done again by nursing tomorrow. -To OR for left foot wound debridement, partial second ray resection wound closure. -WBAT in CAM BOOT only.  -Work with PT/OT as needed  OR today for the above. Will follow post-operatively.  Evelina Bucy, DPM  Accessible via secure chat for questions or concerns.

## 2019-10-16 NOTE — Op Note (Signed)
Patient Name: Eugene Diaz DOB: 07-22-1966  MRN: [id number]   Date of Service: 10/16/2019  Surgeon: Dr. Hardie Pulley, DPM Assistants: None Pre-operative Diagnosis:  Osteomyelitis, cellulitis and abscess Post-operative Diagnosis:  same Procedures:  1) Left first metatarsal resection  2) Delayed Wound Closure  3) Left 2nd Ray Amputation  4) Insertion of antibiotic beads Pathology/Specimens: ID Type Source Tests Collected by Time Destination  1 : bone from left 1st metatarsal; distal end tagged with suture Tissue Soft Tissue, Other SURGICAL PATHOLOGY Evelina Bucy, DPM 10/16/2019 1414   2 : bone from second metatarsal Tissue Soft Tissue, Other SURGICAL PATHOLOGY Evelina Bucy, DPM 10/16/2019 1418   A : bone for culture - left first metatarsal Tissue Soft Tissue, Other AEROBIC/ANAEROBIC CULTURE (SURGICAL/DEEP WOUND) Evelina Bucy, DPM 10/16/2019 1409    Anesthesia: MAC local Hemostasis:  Total Tourniquet Time Documented: Calf (Left) - 48 minutes Total: Calf (Left) - 48 minutes  Estimated Blood Loss: 10  Materials:  Implant Name Type Inv. Item Serial No. Manufacturer Lot No. LRB No. Used Action  KIT STIMULAN RAPID CURE  10CC - [id number] Orthopedic Implant KIT STIMULAN RAPID CURE  10CC  [employer] [id number] Left 1 Implanted   Medications: 1 g vancomycin Complications: None  Indications for Procedure:  This is a 53 y.o. 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  He previous underwent first ray resection.  It was discussed the patient would benefit from further first ray resection and closure and second ray amputation.  All risk benefits in terms of surgery were discussed with the patient no guarantees were given.   Procedure in Detail: Patient was identified in pre-operative holding area. Formal consent was signed and the left lower extremity was marked. Patient was brought back to the operating room. Anesthesia was induced. The extremity was prepped and  draped in the usual sterile fashion. Timeout was taken to confirm patient name, laterality, and procedure prior to incision.   Attention was first directed to the first metatarsal where the previous metatarsal resection was noted.  The incision was reopened and dissection carried down to the level of the first metatarsal.  The first metatarsal was resected with sagittal saw until more firm viable bleeding bone was noted.  The area was then copiously irrigated with pulse lavage.  Attention was then directed to the second toe and metatarsal.  There was a purulent ulcer the probe directly to bone the plantar aspect of the second metatarsal head.  A culture was taken of this area.  A racquet incision was made circumferentially around the second toe and extended medially.  Dissection was carried down through the skin subtenons tissue with care to avoid all neurovascular structures.  The second toe was disarticulated at the metatarsophalangeal joint.  Dissection was continued down onto the second metatarsal and the metatarsal was transected with a sagittal saw.  The area was copiously irrigated with pulse lavage.   Stimulan beads impregnated with vancomycin were then packed into the wound.  Both incisions were then closed in layers with 3-0 Monocryl, 3-0 nylon and skin staples  The foot was then dressed with Surgicel 4 x 4 Kerlix ABD base bandage. Patient tolerated the procedure well.   Disposition: Following a period of post-operative monitoring, patient will be transferred back to the floor.  He will benefit from continued antibiotic therapy.  If he should further still to heal these wounds he would benefit from transmetatarsal position.  He is at risk of developing an  ulceration at the lateral aspect of the foot given the abnormality in the metatarsal parabola.  Once he fully heals he will benefit from a toe filler.

## 2019-10-16 NOTE — Progress Notes (Signed)
CBG 152 (for 2200), did not transfer over

## 2019-10-16 NOTE — Anesthesia Postprocedure Evaluation (Signed)
Anesthesia Post Note  Patient: Eugene Diaz  Procedure(s) Performed: Left foot wound debridement and closure (Left ) Left partial second ray resection; placement of antibiotic beads (Left )     Patient location during evaluation: PACU Anesthesia Type: MAC Level of consciousness: awake and alert, oriented and patient cooperative Pain management: pain level controlled Vital Signs Assessment: post-procedure vital signs reviewed and stable Respiratory status: spontaneous breathing, nonlabored ventilation and respiratory function stable Cardiovascular status: blood pressure returned to baseline and stable Postop Assessment: no apparent nausea or vomiting Anesthetic complications: no    Last Vitals:  Vitals:   10/16/19 1515 10/16/19 1530  BP: 132/79 127/84  Pulse: 77 77  Resp: 18 12  Temp:    SpO2: 100% 97%    Last Pain:  Vitals:   10/16/19 1530  TempSrc:   PainSc: [place]

## 2019-10-16 NOTE — Brief Op Note (Signed)
10/16/2019  3:06 PM  PATIENT:  Eugene Diaz  53 y.o. male  PRE-OPERATIVE DIAGNOSIS:  left foot wound, osteomyelitis  POST-OPERATIVE DIAGNOSIS:  left foot wound, osteomyelitis  PROCEDURE:  Procedure(s): Left foot wound debridement and closure (Left) Left partial second ray resection; placement of antibiotic beads (Left)  SURGEON:  Surgeon(s) and Role:    Evelina Bucy, DPM - Primary  PHYSICIAN ASSISTANT:   ASSISTANTS: none   ANESTHESIA:   IV sedation and MAC  EBL:  10 ml   BLOOD ADMINISTERED:none  DRAINS: none   LOCAL MEDICATIONS USED:  MARCAINE    and Amount: 10 ml  SPECIMEN:   ID Type Source Tests Collected by Time Destination  1 : bone from left 1st metatarsal; distal end tagged with suture Tissue Soft Tissue, Other SURGICAL PATHOLOGY Evelina Bucy, DPM 10/16/2019 1414   2 : bone from second metatarsal Tissue Soft Tissue, Other SURGICAL PATHOLOGY Evelina Bucy, DPM 10/16/2019 1418   A : bone for culture - left first metatarsal Tissue Soft Tissue, Other AEROBIC/ANAEROBIC CULTURE (SURGICAL/DEEP WOUND) Evelina Bucy, DPM 10/16/2019 1409       DISPOSITION OF SPECIMEN:  as above  COUNTS:  YES  TOURNIQUET:   Total Tourniquet Time Documented: Calf (Left) - 48 minutes Total: Calf (Left) - 48 minutes   DICTATION: .Viviann Spare Dictation  PLAN OF CARE: Discharge to home after PACU  PATIENT DISPOSITION:  PACU - hemodynamically stable.   Delay start of Pharmacological VTE agent (>24hrs) due to surgical blood loss or risk of bleeding: not applicable

## 2019-10-16 NOTE — Transfer of Care (Signed)
Immediate Anesthesia Transfer of Care Note  Patient: Eugene Diaz  Procedure(s) Performed: Left foot wound debridement and closure (Left ) Left partial second ray resection; placement of antibiotic beads (Left )  Patient Location: PACU  Anesthesia Type:MAC  Level of Consciousness: awake, alert  and oriented  Airway & Oxygen Therapy: Patient Spontanous Breathing and Patient connected to face mask oxygen  Post-op Assessment: Report given to RN and Post -op Vital signs reviewed and stable  Post vital signs: Reviewed and stable  Last Vitals:  Vitals Value Taken Time  BP 127/74 10/16/19 1505  Temp    Pulse 77 10/16/19 1507  Resp 9 10/16/19 1507  SpO2 100 % 10/16/19 1507  Vitals shown include unvalidated device data.  Last Pain:  Vitals:   10/16/19 1310  TempSrc: [place]  PainSc: 6       Patients Stated Pain Goal: 4 (29/56/21 3086)  Complications: No apparent anesthesia complications

## 2019-10-17 ENCOUNTER — Encounter: Payer: Self-pay | Admitting: *Deleted

## 2019-10-17 LAB — BASIC METABOLIC PANEL
Anion gap: 10 (ref 5–15)
BUN: 26 mg/dL — ABNORMAL HIGH (ref 6–20)
CO2: 26 mmol/L (ref 22–32)
Calcium: 9.3 mg/dL (ref 8.9–10.3)
Chloride: 95 mmol/L — ABNORMAL LOW (ref 98–111)
Creatinine, Ser: 1.08 mg/dL (ref 0.61–1.24)
GFR calc Af Amer: >60 mL/min (ref >60)
GFR calc non Af Amer: >60 mL/min (ref >60)
Glucose, Bld: 188 mg/dL — ABNORMAL HIGH (ref 70–99)
Potassium: 4.9 mmol/L (ref 3.5–5.1)
Sodium: 131 mmol/L — ABNORMAL LOW (ref 135–145)

## 2019-10-17 LAB — CULTURE, BLOOD (ROUTINE X 2)
Culture: NO GROWTH
Culture: NO GROWTH
Special Requests: ADEQUATE
Special Requests: ADEQUATE

## 2019-10-17 LAB — GLUCOSE, CAPILLARY
Glucose-Capillary: 152 mg/dL — ABNORMAL HIGH (ref 70–99)
Glucose-Capillary: 158 mg/dL — ABNORMAL HIGH (ref 70–99)
Glucose-Capillary: 138 mg/dL — ABNORMAL HIGH (ref 70–99)
Glucose-Capillary: 118 mg/dL — ABNORMAL HIGH (ref 70–99)
Glucose-Capillary: 148 mg/dL — ABNORMAL HIGH (ref 70–99)

## 2019-10-17 LAB — CBC
HCT: 26.2 % — ABNORMAL LOW (ref 39.0–52.0)
Hemoglobin: 8.3 g/dL — ABNORMAL LOW (ref 13.0–17.0)
MCH: 31.1 pg (ref 26.0–34.0)
MCHC: 31.7 g/dL (ref 30.0–36.0)
MCV: 98.1 fL (ref 80.0–100.0)
Platelets: 158 10*3/uL (ref 150–400)
RBC: 2.67 MIL/uL — ABNORMAL LOW (ref 4.22–5.81)
RDW: 14.6 % (ref 11.5–15.5)
WBC: 4.7 10*3/uL (ref 4.0–10.5)
nRBC: 0 % (ref 0.0–0.2)

## 2019-10-17 MED ORDER — HEPARIN SODIUM (PORCINE) 5000 UNIT/ML IJ SOLN
5000.0000 [IU] | Freq: Three times a day (TID) | INTRAMUSCULAR | Status: DC
  Administered 2019-10-17 – 2019-10-18 (×4): 5000 [IU] via SUBCUTANEOUS
  Filled 2019-10-17 (×4): qty 1

## 2019-10-17 NOTE — Progress Notes (Signed)
[facility]                                                                              Patient Demographics  Eugene Diaz, is a 53 y.o. male, DOB - 11-12-1965, [id number]  Admit date - 10/11/2019   Admitting Physician Etta Quill, DO  Outpatient Primary MD for the patient is Ginger Organ  Outpatient specialists:   LOS - 6  days   Medical records reviewed and are as summarized below:    No chief complaint on file.      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   Per prior note:On 9/15 patient hadOpen reduction, perc pinning left hallux fracture, incision and drainage left hallux. On 10/14 patient had amputationleft hallux. Cultures grew MSSA On 11/4 had repair of complex dehiscence. Cultures grew Pseudomonas. He was started on cefepime on admission.  Status post left foot I&D, left partial first ray amputation with [name] on 12/13   Assessment & Plan    Principal Problem:   Acute osteomyelitis of toe of left foot ([place]) -Cultures grew Pseudomonas in 09/2019, MSSA in October.  Patient was placed on cefepime on admission, received 1 dose of IV vancomycin -MRI of the left foot on 12/11 showed acute osteomyelitis of the first metatarsal head and diaphoresis with edema, enhancement proximally to the level of the first metatarsal base.  Complex rim-enhancing fluid suggestive of 2.6 cm abscess at the distal aspect of the first metatarsal base, rim-enhancing fluid collection in second MTP joint, suggestive of septic arthritis.  Marrow edema in the second metatarsal and second digit proximal phalanx suggestive of acute osteomyelitis. -Podiatry consulted, status post left foot I&D, left  partial first ray amputation on 12/13 - underwent left foot debridement and closure, left partial second ray resection and placement of antibiotic beads on 12/16 -Operative cultures growing MSSA, on vancomycin and cefepime -Follow podiatry recommendations regarding PT, DVT prophylaxis and pain control -H&H currently stable  Active Problems:   Hypertension -BP currently stable, continue lisinopril, home dose of Lasix  -Follow creatinine closely  Diabetes mellitus type 2, uncontrolled with osteomyelitis and foot ulcer -Hemoglobin A1c 5.3 -CBGs controlled, continue sliding scale insulin   Chronic neuropathic pain -Continue Neurontin  Hyperlipidemia Continue Lipitor  Obesity -BMI 35.3, counseled on diet and weight control  Code Status: full code  DVT Prophylaxis: Start heparin subcu today/postop, cleared for DVT prophylaxis by podiatry Family Communication: Discussed all imaging results, lab results, explained to the patient    Disposition Plan: When cleared by podiatry  Time Spent in minutes 25 minutes   Procedures: 10/13/2019             1) Left Foot Incision and Drainage below the deep fascia             2) Left Partial 1st Ray amputation**  Consultants:   podiatry  Antimicrobials:  Anti-infectives (From admission, onward)   Start     Dose/Rate Route Frequency Ordered Stop   10/16/19 1427  vancomycin (VANCOCIN) powder  Status:  Discontinued       As needed 10/16/19 1427 10/16/19 1601   10/13/19 1003  vancomycin (VANCOCIN) powder  Status:  Discontinued       As needed 10/13/19 1003 10/13/19 1147   10/12/19 0600  ceFEPIme (MAXIPIME) 2 g in sodium chloride 0.9 % 100 mL IVPB     2 g 200 mL/hr over 30 Minutes Intravenous Every 8 hours 10/11/19 2054     10/11/19 2015  ceFEPIme (MAXIPIME) 2 g in sodium chloride 0.9 % 100 mL IVPB     2 g 200 mL/hr over 30 Minutes Intravenous  Once 10/11/19 2008           Medications  Scheduled Meds: . amitriptyline  100 mg  Oral QHS  . atorvastatin  40 mg Oral QPM  . DULoxetine  60 mg Oral Daily  . feeding supplement (ENSURE ENLIVE)  237 mL Oral BID BM  . folic acid  1 mg Oral Daily  . furosemide  20 mg Oral Daily  . gabapentin  300 mg Oral BID WC  . gabapentin  600 mg Oral QHS  . glycopyrrolate  1 mg Oral BID  . insulin aspart  0-5 Units Subcutaneous QHS  . insulin aspart  0-9 Units Subcutaneous TID WC  . lisinopril  20 mg Oral Daily  . multivitamin with minerals  1 tablet Oral Daily  . nutrition supplement (JUVEN)  1 packet Oral BID BM  . pantoprazole  40 mg Oral BID  . primidone  50 mg Oral BID  . sodium chloride flush  10-40 mL Intracatheter Q12H  . thiamine  100 mg Oral Daily   Or  . thiamine  100 mg Intravenous Daily   Continuous Infusions: . sodium chloride 10 mL/hr at 10/16/19 1800  . ceFEPime (MAXIPIME) IV    . ceFEPime (MAXIPIME) IV 2 g (10/17/19 1255)   PRN Meds:.sodium chloride, acetaminophen **OR** acetaminophen, albuterol, docusate sodium, hydrOXYzine, ondansetron **OR** ondansetron (ZOFRAN) IV, oxyCODONE, sodium chloride flush      Subjective:   Eugene Diaz was seen and examined today.  States intermittently has left foot pain, 6/10, sharp.  At this time feels okay.  No acute issues.  Patient denies dizziness, chest pain, shortness of breath, abdominal pain, N/V/D/C, new weakness, numbess, tingling.  No fevers or chills, or acute events overnight  Objective:   Vitals:   10/16/19 2052 10/17/19 0208 10/17/19 0534 10/17/19 1333  BP: 116/76 131/65 131/64 116/67  Pulse: 97 100 89 78  Resp: 18 19 20 16   Temp: 98.5 F (36.9 C) 98.8 F (37.1 C) 99.1 F (37.3 C) 99.2 F (37.3 C)  TempSrc: Oral Oral Oral Oral  SpO2: 100% 98% 99% 92%  Weight:      Height:        Intake/Output Summary (Last 24 hours) at 10/17/2019 1345 Last data filed at 10/17/2019 1220 Gross per 24 hour  Intake 1824.49 ml  Output 2075 ml  Net -250.51 ml     Wt Readings from Last 3 Encounters:    10/16/19 124.7 kg  09/04/19 125.7 kg  08/13/19 130.2 kg   Physical Exam  General: Alert and oriented x 3, NAD  Eyes:   HEENT:  Atraumatic, normocephalic  Cardiovascular: S1 S2 clear, RRR  Respiratory: CTAB, no wheezing, rales or rhonchi  Gastrointestinal: Soft, nontender, nondistended, NBS  Ext:  no pedal edema RLE, dressing intact on the left foot  Neuro: no new deficits  Musculoskeletal: No cyanosis, clubbing  Skin: Dressing intact on the left foot  Psych: Normal affect and demeanor, alert and oriented x3    Data Reviewed:  I have personally reviewed following labs and imaging studies  Micro Results Recent Results (from the past 240 hour(s))  Blood Cultures x 2 sites     Status: None   Collection Time: 10/11/19 10:17 PM   Specimen: BLOOD LEFT HAND  Result Value Ref Range Status   Specimen Description   Final    BLOOD LEFT HAND Performed at [facility] [address], [place], [place] [address]    Special Requests   Final    BOTTLES DRAWN AEROBIC ONLY Blood Culture adequate volume Performed at [facility] [address], [place], [place] [address]    Culture   Final    NO GROWTH 5 DAYS Performed at [facility], [place] [address], [place], [place] [address]    Report Status 10/17/2019 FINAL  Final  Blood Cultures x 2 sites     Status: None   Collection Time: 10/11/19 10:17 PM   Specimen: BLOOD  Result Value Ref Range Status   Specimen Description   Final    BLOOD LEFT ANTECUBITAL Performed at [facility] [address], [place], [place] [address]    Special Requests   Final    BOTTLES DRAWN AEROBIC ONLY Blood Culture adequate volume Performed at [facility] [address] [place], [place] [address]    Culture   Final    NO GROWTH 5 DAYS Performed at [facility], [place] [address], [place], [place] [address]    Report Status 10/17/2019 FINAL  Final  SARS  CORONAVIRUS 2 (TAT 6-24 HRS) Nasopharyngeal Nasopharyngeal Swab     Status: None   Collection Time: 10/12/19  1:47 AM   Specimen: Nasopharyngeal Swab  Result Value Ref Range Status   SARS Coronavirus 2 NEGATIVE NEGATIVE Final    Comment: (NOTE) SARS-CoV-2 target nucleic acids are NOT DETECTED. The SARS-CoV-2 RNA is generally detectable in upper and lower respiratory specimens during the acute phase of infection. Negative results do not preclude SARS-CoV-2 infection, do not rule out co-infections with other pathogens, and should not be used as the sole basis for treatment or other patient management decisions. Negative results must be combined with clinical observations, patient history, and epidemiological information. The expected result is Negative. Fact Sheet for Patients: SugarRoll.be Fact Sheet for Healthcare Providers: https://www.woods-mathews.com/ This test is not yet approved or cleared by the [place] FDA and  has been authorized for detection and/or diagnosis of SARS-CoV-2 by FDA under an Emergency Use Authorization (EUA). This EUA will remain  in effect (meaning this test can be used) for the duration of the COVID-19 declaration under Section 56 4(b)(1) of the Act, 21 U.S.C. section 360bbb-3(b)(1), unless the authorization is terminated or revoked sooner. Performed at [facility], [place] [address], [place], [place] [address]   Surgical pcr screen     Status: None   Collection Time: 10/12/19  6:30 AM   Specimen: Nasal Mucosa; Nasal Swab  Result Value Ref Range Status   MRSA, PCR NEGATIVE NEGATIVE Final   Staphylococcus aureus NEGATIVE NEGATIVE Final    Comment: (NOTE) The Xpert SA Assay (FDA approved for NASAL specimens in patients 19 years of age and older), is one component  of a comprehensive surveillance program. It is not intended to diagnose infection nor to guide or monitor treatment. Performed at [facility], [place] [address], [place], [place] [address]   Fungus Culture With Stain     Status: None   Collection Time: 10/13/19 10:04 AM   Specimen: PATH Amputaion Arm/Leg; Tissue  Result Value Ref Range Status   Fungus Stain Final report  Final    Comment: (NOTE) Performed At: [facility] [place], [place] [id number] Rush Farmer MD [id number]    Fungus (Mycology) Culture PENDING  Incomplete   Fungal Source WOUND  Corrected    Comment: LEFT FOOT Performed at [facility], [place] [address] [place], [place] [address] CORRECTED ON 12/13 AT 1553: PREVIOUSLY REPORTED AS ARM   Aerobic/Anaerobic Culture (surgical/deep wound)     Status: None (Preliminary result)   Collection Time: 10/13/19 10:04 AM   Specimen: PATH Amputaion Arm/Leg; Tissue  Result Value Ref Range Status   Specimen Description WOUND LEFT FOOT  Final   Special Requests SPEC A ON SWABS  Final   Gram Stain   Final    RARE WBC PRESENT, PREDOMINANTLY PMN FEW GRAM POSITIVE COCCI RARE GRAM POSITIVE RODS    Culture   Final    ABUNDANT STAPHYLOCOCCUS AUREUS WITHIN MIXED ORGANISMS NO ANAEROBES ISOLATED; CULTURE IN PROGRESS FOR 5 DAYS    Report Status PENDING  Incomplete   Organism ID, Bacteria STAPHYLOCOCCUS AUREUS  Final      Susceptibility   Staphylococcus aureus - MIC*    CIPROFLOXACIN <=0.5 SENSITIVE Sensitive     ERYTHROMYCIN <=0.25 SENSITIVE Sensitive     GENTAMICIN <=0.5 SENSITIVE Sensitive     OXACILLIN 0.5 SENSITIVE Sensitive     TETRACYCLINE <=1 SENSITIVE Sensitive     VANCOMYCIN 1 SENSITIVE Sensitive     TRIMETH/SULFA <=10 SENSITIVE Sensitive     CLINDAMYCIN <=0.25 SENSITIVE Sensitive     RIFAMPIN <=0.5 SENSITIVE Sensitive     Inducible Clindamycin Value in next row Sensitive      NEGATIVEPerformed at [facility] [address], [place], [place] [address]    * ABUNDANT STAPHYLOCOCCUS AUREUS  Fungus Culture Result     Status: None   Collection Time:  10/13/19 10:04 AM  Result Value Ref Range Status   Result 1 Comment  Final    Comment: (NOTE) KOH/Calcofluor preparation:  no fungus observed. Performed At: [facility] [place], [place] [id number] Rush Farmer MD [id number]   Fungus Culture With Stain     Status: None   Collection Time: 10/13/19 10:07 AM   Specimen: PATH Digit amputation; Tissue  Result Value Ref Range Status   Fungus Stain Final report  Final    Comment: (NOTE) Performed At: [facility] [place], [place] [id number] Rush Farmer MD [id number]    Fungus (Mycology) Culture PENDING  Incomplete   Fungal Source BONE  Corrected    Comment: LEFT FOOT 1ST METATARSAL Performed at [facility], [place] [address], [place], [place] [address] CORRECTED ON 12/13 AT 1553: PREVIOUSLY REPORTED AS FOOT LEFT   Aerobic/Anaerobic Culture (surgical/deep wound)     Status: None (Preliminary result)   Collection Time: 10/13/19 10:07 AM   Specimen: PATH Digit amputation; Tissue  Result Value Ref Range Status   Specimen Description   Final    LEFT FOOT 1ST METATARSAL Performed at [facility], [place] [address], [place], [place] [address]  Special Requests   Final    NONE Performed at [facility], [place] [address], [place], [place] [address]    Gram Stain   Final    NO WBC SEEN NO ORGANISMS SEEN Performed at [facility], [place] [address], [place], [place] [address]    Culture   Final    RARE STAPHYLOCOCCUS AUREUS WITHIN MIXED ORGANISMS NO ANAEROBES ISOLATED; CULTURE IN PROGRESS FOR 5 DAYS    Report Status PENDING  Incomplete   Organism ID, Bacteria STAPHYLOCOCCUS AUREUS  Final      Susceptibility   Staphylococcus aureus - MIC*    CIPROFLOXACIN <=0.5 SENSITIVE Sensitive     ERYTHROMYCIN <=0.25 SENSITIVE Sensitive     GENTAMICIN <=0.5 SENSITIVE Sensitive     OXACILLIN 0.5 SENSITIVE Sensitive     TETRACYCLINE <=1 SENSITIVE Sensitive       VANCOMYCIN <=0.5 SENSITIVE Sensitive     TRIMETH/SULFA <=10 SENSITIVE Sensitive     CLINDAMYCIN <=0.25 SENSITIVE Sensitive     RIFAMPIN <=0.5 SENSITIVE Sensitive     Inducible Clindamycin NEGATIVE Sensitive     * RARE STAPHYLOCOCCUS AUREUS  Fungus Culture Result     Status: None   Collection Time: 10/13/19 10:07 AM  Result Value Ref Range Status   Result 1 Comment  Final    Comment: (NOTE) KOH/Calcofluor preparation:  no fungus observed. Performed At: [facility] [place], [place] [id number] Rush Farmer MD [id number]   Aerobic/Anaerobic Culture (surgical/deep wound)     Status: None (Preliminary result)   Collection Time: 10/16/19  2:09 PM   Specimen: Soft Tissue, Other  Result Value Ref Range Status   Specimen Description   Final    FOOT LEFT DEBRIDEMENT Performed at [facility] [address], [place], [place] [address]    Special Requests   Final    NONE Performed at [facility], [place] [address] [place], [place] [address]    Gram Stain   Final    RARE WBC PRESENT, PREDOMINANTLY PMN NO ORGANISMS SEEN    Culture   Final    NO GROWTH < 24 HOURS Performed at [facility] [address] [place], [place] [address]    Report Status PENDING  Incomplete    Radiology Reports MR FOOT LEFT W WO CONTRAST  Result Date: 10/11/2019 CLINICAL DATA:  Osteomyelitis. Recent amputation of the left great toe EXAM: MRI OF THE LEFT FOREFOOT WITHOUT AND WITH CONTRAST TECHNIQUE: Multiplanar, multisequence MR imaging of the left forefoot was performed both before and after administration of intravenous contrast. CONTRAST:  11mL GADAVIST GADOBUTROL 1 MMOL/ML IV SOLN COMPARISON:  X-ray 10/11/2019 FINDINGS: Bones/Joint/Cartilage Amputation of the left great toe at the first MTP joint. Erosive changes of the first metatarsal head with confluent low T1 signal changes extending approximately 2.5 cm proximally into the  diaphysis. There is extensive bone marrow edema and enhancement throughout the first metatarsal extending to the base. Dorsal dislocation at the second MTP joint with marrow edema throughout the second metatarsal and second digit proximal phalanx. Patchy low T1 signal changes in the second metatarsal head (series 4, images 8-10). There is mild patchy marrow edema in the third metatarsal head with subtle low T1 signal change. Remaining osseous structures maintain fatty T1 marrow signal. Ligaments LisFranc ligament intact. Muscles and Tendons Findings of diffuse myositis and chronic denervation changes. No tenosynovial fluid collection. Flexor and extensor tendons of the great toe are discontinuous, likely postsurgical. Soft  tissues Rim enhancing fluid collection overlying the distal aspect of the first metatarsal head measuring approximately 2.6 x 0.8 cm (series 9, image 12). Fluid extends along the plantar aspect of the first metatarsal neck. Enhancing fluid collection extends into the intermetatarsal space and second MTP joint concerning for septic arthritis. Extensive soft tissue thickening and edema about the forefoot and amputation site. Soft tissue thickening and metallic susceptibility artifact at the surgical site overlying the great toe. IMPRESSION: 1. Acute osteomyelitis of the first metatarsal head and diaphysis. Bone marrow edema and enhancement extend proximally to the level of the first metatarsal base. There is complex rim enhancing fluid collection over the distal aspect of the first metatarsal base extending more proximally along its plantar aspect measuring up to 2.6 cm suggestive of abscess. 2. Rim enhancing fluid collection at the second MTP joint, which is dorsally dislocated, suggesting septic arthritis. 3. Marrow edema within the second metatarsal and second digit proximal phalanx where there is low signal changes at the second metatarsal head suggesting acute osteomyelitis. 4. Marrow edema at  the third metatarsal head without definite confluent low T1 signal change to suggest acute osteomyelitis. 5. Findings of diffuse myositis and chronic denervation changes. These results will be called to the ordering clinician or representative by the Radiologist Assistant, and communication documented in the PACS or zVision Dashboard. Electronically Signed   By: Davina Poke M.D.   On: 10/11/2019 22:33   DG Foot 2 Views Left  Result Date: 10/16/2019 CLINICAL DATA:  Left foot surgery EXAM: LEFT FOOT - 2 VIEW COMPARISON:  10/13/2019 FINDINGS: Further interval resection of the first metatarsal with amputation at the proximal metaphysis. Interval second ray resection at the mid to distal second metatarsal diaphysis. There is radiopaque antibiotic bead placement at the resection site. Overlying skin staples. Osseous structures appear otherwise intact. Small bidirectional calcaneal enthesophytes. IMPRESSION: First and second transmetatarsal amputations of the left foot with antibiotic bead placement. Electronically Signed   By: Davina Poke M.D.   On: 10/16/2019 15:47   DG Foot 2 Views Left  Result Date: 10/13/2019 CLINICAL DATA:  Postoperative EXAM: LEFT FOOT - 2 VIEW COMPARISON:  10/11/2019 FINDINGS: Interval postoperative findings of revision transmetatarsal amputation of the left first digit. Osseous structures are otherwise intact. Joint spaces are well preserved. Bandage material about the surgical site. IMPRESSION: Interval postoperative findings of revision transmetatarsal amputation of the left first digit. Electronically Signed   By: Eddie Candle M.D.   On: 10/13/2019 13:26   DG Foot Complete Left  Result Date: 10/14/2019 Please see detailed radiograph report in office note.  DG Foot Complete Left  Result Date: 10/09/2019 Please see detailed radiograph report in office note.   Lab Data:  CBC: Recent Labs  Lab 10/11/19 2015 10/13/19 0452 10/14/19 0429 10/15/19 0521  10/16/19 0347 10/17/19 0629  WBC 5.6 4.9 6.3 4.9 5.1 4.7  NEUTROABS 3.9  --   --   --   --   --   HGB 9.0* 9.1* 9.5* 8.6* 8.6* 8.3*  HCT 28.4* 28.9* 30.0* 26.7* 27.6* 26.2*  MCV 96.9 96.7 98.0 96.7 97.9 98.1  PLT 127* 133* 137* 131* 164 0000000   Basic Metabolic Panel: Recent Labs  Lab 10/11/19 2015 10/13/19 0452 10/14/19 0429 10/15/19 0521 10/16/19 0347 10/17/19 0629  NA  --  136 131* 132* 135 131*  K  --  4.3 4.2 4.3 4.5 4.9  CL  --  102 98 98 101 95*  CO2  --  27  25 26 26 26   GLUCOSE  --  166* 133* 172* 181* 188*  BUN  --  16 18 20  32* 26*  CREATININE  --  0.86 0.90 0.94 1.07 1.08  CALCIUM  --  9.0 9.1 9.1 9.1 9.3  MG 2.1  --   --   --   --   --   PHOS 3.2  --   --   --   --   --    GFR: Estimated Creatinine Clearance: 109.7 mL/min (by C-G formula based on SCr of 1.08 mg/dL). Liver Function Tests: Recent Labs  Lab 10/11/19 2016  AST 13*  ALT 12  ALKPHOS 85  BILITOT 0.8  PROT 7.0  ALBUMIN 3.5   No results for input(s): LIPASE, AMYLASE in the last 168 hours. No results for input(s): AMMONIA in the last 168 hours. Coagulation Profile: Recent Labs  Lab 10/11/19 2015  INR 1.0   Cardiac Enzymes: No results for input(s): CKTOTAL, CKMB, CKMBINDEX, TROPONINI in the last 168 hours. BNP (last 3 results) No results for input(s): PROBNP in the last 8760 hours. HbA1C: No results for input(s): HGBA1C in the last 72 hours. CBG: Recent Labs  Lab 10/16/19 1508 10/16/19 1632 10/16/19 2203 10/17/19 0741 10/17/19 1205  GLUCAP 110* 93 152* 158* 138*   Lipid Profile: No results for input(s): CHOL, HDL, LDLCALC, TRIG, CHOLHDL, LDLDIRECT in the last 72 hours. Thyroid Function Tests: No results for input(s): TSH, T4TOTAL, FREET4, T3FREE, THYROIDAB in the last 72 hours. Anemia Panel: No results for input(s): VITAMINB12, FOLATE, FERRITIN, TIBC, IRON, RETICCTPCT in the last 72 hours. Urine analysis:    Component Value Date/Time   COLORURINE YELLOW 08/14/2019 1232    APPEARANCEUR CLEAR 08/14/2019 1232   LABSPEC 1.020 08/14/2019 1232   PHURINE 5.0 08/14/2019 1232   GLUCOSEU NEGATIVE 08/14/2019 1232   HGBUR NEGATIVE 08/14/2019 [place] 08/14/2019 [place] 08/14/2019 1232   PROTEINUR NEGATIVE 08/14/2019 1232   UROBILINOGEN 4.0 (H) 10/09/2008 1114   NITRITE NEGATIVE 08/14/2019 [place] 08/14/2019 1232     Graylen Noboa M.D. [facility] 10/17/2019, 1:45 PM   Call night coverage person covering after 7pm

## 2019-10-17 NOTE — Progress Notes (Signed)
Nutrition Follow-up  DOCUMENTATION CODES:   Obesity unspecified  INTERVENTION:   -Ensure Enlive po BID, each supplement provides 350 kcal and 20 grams of protein -Juven Fruit Punch BID, each serving provides 95kcal and 2.5g of protein (amino acids glutamine and arginine)  NUTRITION DIAGNOSIS:   Increased nutrient needs related to post-op healing, wound healing as evidenced by estimated needs.  Ongoing.  GOAL:   Patient will meet greater than or equal to 90% of their needs  Progressing.  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  12/13: s/p I&D of left foot abscess with first metatarsal resection 12/16: s/p I&D of left foot wound and closure, left partial second ray resection  Patient now on heart healthy/CHO modified diet following surgery yesterday. Pt consumed 100% of breakfast this morning but no lunch. Pt is drinking Ensure and Juven supplements.  Admission weight: 268 lbs. Current weight: 275 lbs.  I/Os: -5.2L since admit UOP: 2050 ml x 24 hrs  Medications: Folic acid tablet, Lasix tablet, Multivitamin with minerals daily, Thiamine tablet Labs reviewed: CBGs: 138-158 Low Na  Diet Order:   Diet Order            Diet heart healthy/carb modified Room service appropriate? Yes; Fluid consistency: Thin  Diet effective now              EDUCATION NEEDS:   Not appropriate for education at this time  Skin:  Skin Assessment: Skin Integrity Issues: Skin Integrity Issues:: Other (Comment) Other: Left foot osteomyelitis and abscess  Last BM:  12/14  Height:   Ht Readings from Last 1 Encounters:  10/16/19 6\' 2"  (1.88 m)    Weight:    Wt Readings from Last 1 Encounters:  10/16/19 124.7 kg    Ideal Body Weight:  86.3 kg  BMI:  Body mass index is 35.31 kg/m.  Estimated Nutritional Needs:   Kcal:  2200-2400  Protein:  105-115g  Fluid:  2L/day  Clayton Bibles, MS, RD, LDN Inpatient Clinical Dietitian Pager: [phone] After Hours Pager: [phone]

## 2019-10-17 NOTE — Progress Notes (Signed)
Physical Therapy Treatment Patient Details Name: Eugene Diaz MRN: [id number] DOB: January 24, 1966 Today's Date: 10/17/2019    History of Present Illness 53 year old male with past medical history of type 2 diabetes, alcohol use, peripheral neuropathy, status post amputation of left great toe and first MTP,  9/15 patient had Open reduction, perc pinning left hallux fracture, incision and drainage left hallux, 10/14 patient had amputation left hallux, 11/4 had repair of complex dehiscence. Pt  presented on 12/11 for direct admission from podiatry office due to cellulitis and osteomyelitis of left foot. pt is s/p I&D today of abscess with first metatarsal resection and s/p wound debridement and closure 10/16/19.    PT Comments    Pt assisted with donning CAM walker sitting EOB and then ambulated in hallway good distance.  Follow Up Recommendations  No PT follow up     Equipment Recommendations  Rolling walker with 5" wheels    Recommendations for Other Services       Precautions / Restrictions Precautions Precautions: Fall Restrictions Weight Bearing Restrictions: Yes LLE Weight Bearing: Weight bearing as tolerated Other Position/Activity Restrictions: per progress notes-->WBAT to LLE in CAM boot    Mobility  Bed Mobility Overal bed mobility: Modified Independent                Transfers Overall transfer level: Needs assistance Equipment used: Rolling walker (2 wheeled) Transfers: Sit to/from Stand Sit to Stand: Supervision         General transfer comment: supervision for safety  Ambulation/Gait Ambulation/Gait assistance: Min guard;Min assist Gait Distance (Feet): 400 Feet Assistive device: Rolling walker (2 wheeled) Gait Pattern/deviations: Decreased stance time - left;Decreased stride length;Decreased weight shift to left     General Gait Details: cues for decr step length, encouraged heel WBing, pt also with tendency to lift one hand from RW and requiring  slight assist for steadying/safety   Stairs             Wheelchair Mobility    Modified Rankin (Stroke Patients Only)       Balance                                            Cognition Arousal/Alertness: Awake/alert Behavior During Therapy: WFL for tasks assessed/performed Overall Cognitive Status: Within Functional Limits for tasks assessed                                        Exercises      General Comments        Pertinent Vitals/Pain Pain Assessment: No/denies pain    Home Living                      Prior Function            PT Goals (current goals can now be found in the care plan section) Progress towards PT goals: Progressing toward goals    Frequency    Min 3X/week      PT Plan Current plan remains appropriate    Co-evaluation              AM-PAC PT "6 Clicks" Mobility   Outcome Measure  Help needed turning from your back to your side while in a flat bed without using bedrails?: None  Help needed moving from lying on your back to sitting on the side of a flat bed without using bedrails?: None Help needed moving to and from a bed to a chair (including a wheelchair)?: A Little Help needed standing up from a chair using your arms (e.g., wheelchair or bedside chair)?: A Little Help needed to walk in hospital room?: A Little Help needed climbing 3-5 steps with a railing? : A Little 6 Click Score: 20    End of Session Equipment Utilized During Treatment: Gait belt Activity Tolerance: Patient tolerated treatment well Patient left: in chair;with call bell/phone within reach   PT Visit Diagnosis: Difficulty in walking, not elsewhere classified (R26.2)     Time: [id number] PT Time Calculation (min) (ACUTE ONLY): 21 min  Charges:  $Gait Training: 8-22 mins                     Arlyce Dice, DPT Acute Rehabilitation Services Office: [place] [facility] 10/17/2019, 3:42  PM

## 2019-10-18 ENCOUNTER — Other Ambulatory Visit: Payer: Self-pay | Admitting: Podiatry

## 2019-10-18 DIAGNOSIS — Z9889 Other specified postprocedural states: Secondary | ICD-10-CM

## 2019-10-18 LAB — AEROBIC/ANAEROBIC CULTURE W GRAM STAIN (SURGICAL/DEEP WOUND): Gram Stain: NONE SEEN

## 2019-10-18 LAB — BASIC METABOLIC PANEL
Anion gap: 7 (ref 5–15)
BUN: 31 mg/dL — ABNORMAL HIGH (ref 6–20)
CO2: 27 mmol/L (ref 22–32)
Calcium: 9.3 mg/dL (ref 8.9–10.3)
Chloride: 99 mmol/L (ref 98–111)
Creatinine, Ser: 1.05 mg/dL (ref 0.61–1.24)
GFR calc Af Amer: >60 mL/min (ref >60)
GFR calc non Af Amer: >60 mL/min (ref >60)
Glucose, Bld: 136 mg/dL — ABNORMAL HIGH (ref 70–99)
Potassium: 4.5 mmol/L (ref 3.5–5.1)
Sodium: 133 mmol/L — ABNORMAL LOW (ref 135–145)

## 2019-10-18 LAB — CBC
HCT: 24.6 % — ABNORMAL LOW (ref 39.0–52.0)
Hemoglobin: 7.7 g/dL — ABNORMAL LOW (ref 13.0–17.0)
MCH: 30.2 pg (ref 26.0–34.0)
MCHC: 31.3 g/dL (ref 30.0–36.0)
MCV: 96.5 fL (ref 80.0–100.0)
Platelets: 177 10*3/uL (ref 150–400)
RBC: 2.55 MIL/uL — ABNORMAL LOW (ref 4.22–5.81)
RDW: 14.5 % (ref 11.5–15.5)
WBC: 4.1 10*3/uL (ref 4.0–10.5)
nRBC: 0 % (ref 0.0–0.2)

## 2019-10-18 LAB — ABO/RH: ABO/RH(D): A POS

## 2019-10-18 LAB — GLUCOSE, CAPILLARY
Glucose-Capillary: 150 mg/dL — ABNORMAL HIGH (ref 70–99)
Glucose-Capillary: 151 mg/dL — ABNORMAL HIGH (ref 70–99)

## 2019-10-18 LAB — PREPARE RBC (CROSSMATCH)

## 2019-10-18 MED ORDER — HYDROCODONE-ACETAMINOPHEN 5-325 MG PO TABS: 1.0000 | ORAL_TABLET | Freq: Four times a day (QID) | ORAL | 0 refills | Status: DC | PRN

## 2019-10-18 MED ORDER — DOXYCYCLINE HYCLATE 100 MG PO TABS: 100.0000 mg | ORAL_TABLET | Freq: Two times a day (BID) | ORAL | 0 refills | Status: DC

## 2019-10-18 MED ORDER — ONDANSETRON 4 MG PO TBDP: 4.0000 mg | ORAL_TABLET | Freq: Three times a day (TID) | ORAL | 0 refills | Status: DC | PRN

## 2019-10-18 MED ORDER — SODIUM CHLORIDE 0.9% IV SOLUTION: Freq: Once | INTRAVENOUS | Status: AC

## 2019-10-18 NOTE — Progress Notes (Signed)
Discharge instructions given to patient. Patient had no questions. NT or writer will wheel patient out once family comes in  

## 2019-10-18 NOTE — Discharge Summary (Signed)
Physician Discharge Summary   Patient ID: Eugene Diaz MRN: [id number] DOB/AGE: [birth year] 53 y.o.  Admit date: 10/11/2019 Discharge date: 10/18/2019  Primary Care Physician:  Eugene Munch, PA-C   Recommendations for Outpatient Follow-up:  1. Follow up with PCP in 1-2 weeks 2. Follow-up with Dr. March Diaz, podiatry in 2 weeks, cleared from their standpoint. 3. Recommended to leave dressing intact until follow-up, 2 weeks coverage with doxycycline for MSSA, will extend if second bone culture positive  Home Health: No PT needed Equipment/Devices: DME rolling walker  Discharge Condition: stable  CODE STATUS: FULL Diet recommendation: Carb modified diet   Discharge Diagnoses:   . Acute osteomyelitis of toe of left foot ([place]) . Acute anemia secondary to blood loss from surgery . Diabetic infection of left foot ([place]) . Hyperlipidemia . Diabetic neuropathy ([place]) . Obesity . Hypertension   Consults: Podiatry, Dr. March Diaz    Allergies:  No Known Allergies   DISCHARGE MEDICATIONS: Allergies as of 10/18/2019   No Known Allergies     Medication List    STOP taking these medications   oxyCODONE HCl 30 MG Taba     TAKE these medications   Accu-Chek Guide test strip Generic drug: glucose blood USE TO TEST BLOOD SUGARTDAILY AS DIRECTED.   Accu-Chek Guide w/Device Kit USE TO TEST BLOOD SUGARAAS DIRECTED BY DOCTOR.   Accu-Chek Softclix Lancets lancets USE TO TEST BLOOD SUGARADAILY AS DIRECTED.   albuterol 108 (90 Base) MCG/ACT inhaler Commonly known as: VENTOLIN HFA Inhale 2 puffs into the lungs every 6 (six) hours as needed for wheezing or shortness of breath.   amitriptyline 100 MG tablet Commonly known as: ELAVIL Take 100 mg by mouth at bedtime.   atorvastatin 40 MG tablet Commonly known as: LIPITOR Take 40 mg by mouth every evening.   docusate sodium 100 MG capsule Commonly known as: COLACE Take 100 mg by mouth daily as needed for mild constipation.    doxycycline 100 MG tablet Commonly known as: VIBRA-TABS Take 1 tablet (100 mg total) by mouth 2 (two) times daily.   furosemide 20 MG tablet Commonly known as: LASIX Take 20 mg by mouth daily.   gabapentin 300 MG capsule Commonly known as: NEURONTIN Take 938m in the morning and at noon, then take 12037mat bedtime What changed:   how much to take  how to take this  when to take this   glimepiride 4 MG tablet Commonly known as: AMARYL Take 4 mg by mouth daily.   glycopyrrolate 1 MG tablet Commonly known as: ROBINUL Take 1 mg by mouth 2 (two) times daily.   HYDROcodone-acetaminophen 5-325 MG tablet Commonly known as: Norco Take 1 tablet by mouth every 6 (six) hours as needed for moderate pain.   hydrOXYzine 25 MG tablet Commonly known as: ATARAX/VISTARIL Take 25 mg by mouth 4 (four) times daily as needed for anxiety.   lidocaine 5 % ointment Commonly known as: XYLOCAINE APPLY TO AFFECTED AREAS TWICE DAILY AS NEEDED. What changed: See the new instructions.   lisinopril 20 MG tablet Commonly known as: ZESTRIL Take 20 mg by mouth daily.   omeprazole 20 MG capsule Commonly known as: PRILOSEC Take 1 capsule (20 mg total) by mouth 2 (two) times daily before a meal. 1 PO 30 mins prior to breakfast and supper What changed: additional instructions   ondansetron 4 MG disintegrating tablet Commonly known as: Zofran ODT Take 1 tablet (4 mg total) by mouth every 8 (eight) hours as needed for nausea or  vomiting.   primidone 50 MG tablet Commonly known as: MYSOLINE Take 1 tablet (50 mg total) by mouth 2 (two) times daily.   Voltaren 1 % Gel Generic drug: diclofenac Sodium Apply 2 g topically 4 (four) times daily.            Durable Medical Equipment  (From admission, onward)         Start     Ordered   10/18/19 1020  For home use only DME Walker rolling  Once    Comments: 5 INCHES WHEELS  Question:  Patient needs a walker to treat with the following  condition  Answer:  Gait instability   10/18/19 1019           Brief H and P: For complete details please refer to admission H and P, but in briefPatient is a 53 year old male with past medical history of type 2 diabetes, alcohol use, peripheral neuropathy, status post amputation of left great toe and first MTP, who presented on 12/11 for direct admission from podiatry office due to cellulitis and osteomyelitis of left foot.  Per prior note:On 9/15 patient hadOpen reduction, perc pinning left hallux fracture, incision and drainage left hallux. On 10/14 patient had amputationleft hallux. Cultures grew MSSA On 11/4 had repair of complex dehiscence. Cultures grew Pseudomonas. He was started on cefepime on admission.  Status post left foot I&D, left partial first ray amputation with Dr. March Diaz on 12/13.  Hospital Course:   Acute osteomyelitis of toe of left foot ([place]) -Status post left foot I&D, partial first ray and second ray amputation -Cultures grew Pseudomonas in 09/2019, MSSA in October.  Patient was placed on cefepime on admission, received 1 dose of IV vancomycin -MRI of the left foot on 12/11 showed acute osteomyelitis of the first metatarsal head and diaphoresis with edema, enhancement proximally to the level of the first metatarsal base.  Complex rim-enhancing fluid suggestive of 2.6 cm abscess at the distal aspect of the first metatarsal base, rim-enhancing fluid collection in second MTP joint, suggestive of septic arthritis.  Marrow edema in the second metatarsal and second digit proximal phalanx suggestive of acute osteomyelitis. -Podiatry was consulted, patient underwent left foot I&D, left partial first ray amputation on 12/13 -Subsequently underwent left foot debridement and closure, left partial second ray resection and placement of antibiotic beads on 12/16 -Operative cultures grew MSSA, Dr. March Diaz recommended 2 weeks of oral doxycycline and may need to extend, will follow  outpatient.   Acute on chronic anemia secondary to blood loss -Hemoglobin 7.7, baseline hemoglobin 9-10, status post surgeries x2, no active bleeding -Transfuse 1 unit packed RBC transfusion prior to discharge    Hypertension -BP currently stable, continue lisinopril, home dose of Lasix  -Creatinine stable  Diabetes mellitus type 2, uncontrolled with osteomyelitis and foot ulcer -Hemoglobin A1c 5.3 CBGs stable  Chronic neuropathic pain -Continue Neurontin  Hyperlipidemia Continue Lipitor  Obesity -BMI 35.3, counseled on diet and weight control    Day of Discharge S: Patient doing well, hoping to go home.  No worsening pain bleeding, fevers  BP (!) 111/59   Pulse 70   Temp 98.3 F (36.8 C) (Oral)   Resp 16   Ht '6\' 2"'$  (1.88 m)   Wt 124.7 kg   SpO2 92%   BMI 35.31 kg/m   Physical Exam: General: Alert and awake oriented x3 not in any acute distress. HEENT: anicteric sclera, pupils reactive to light and accommodation CVS: S1-S2 clear no murmur rubs or gallops  Chest: clear to auscultation bilaterally, no wheezing rales or rhonchi Abdomen: soft nontender, nondistended, normal bowel sounds Extremities: Left foot dressing intact Neuro: Cranial nerves II-XII intact, no focal neurological deficits   The results of significant diagnostics from this hospitalization (including imaging, microbiology, ancillary and laboratory) are listed below for reference.      Procedures/Studies:  MR FOOT LEFT W WO CONTRAST  Result Date: 10/11/2019 CLINICAL DATA:  Osteomyelitis. Recent amputation of the left great toe EXAM: MRI OF THE LEFT FOREFOOT WITHOUT AND WITH CONTRAST TECHNIQUE: Multiplanar, multisequence MR imaging of the left forefoot was performed both before and after administration of intravenous contrast. CONTRAST:  7m GADAVIST GADOBUTROL 1 MMOL/ML IV SOLN COMPARISON:  X-ray 10/11/2019 FINDINGS: Bones/Joint/Cartilage Amputation of the left great toe at the first MTP  joint. Erosive changes of the first metatarsal head with confluent low T1 signal changes extending approximately 2.5 cm proximally into the diaphysis. There is extensive bone marrow edema and enhancement throughout the first metatarsal extending to the base. Dorsal dislocation at the second MTP joint with marrow edema throughout the second metatarsal and second digit proximal phalanx. Patchy low T1 signal changes in the second metatarsal head (series 4, images 8-10). There is mild patchy marrow edema in the third metatarsal head with subtle low T1 signal change. Remaining osseous structures maintain fatty T1 marrow signal. Ligaments LisFranc ligament intact. Muscles and Tendons Findings of diffuse myositis and chronic denervation changes. No tenosynovial fluid collection. Flexor and extensor tendons of the great toe are discontinuous, likely postsurgical. Soft tissues Rim enhancing fluid collection overlying the distal aspect of the first metatarsal head measuring approximately 2.6 x 0.8 cm (series 9, image 12). Fluid extends along the plantar aspect of the first metatarsal neck. Enhancing fluid collection extends into the intermetatarsal space and second MTP joint concerning for septic arthritis. Extensive soft tissue thickening and edema about the forefoot and amputation site. Soft tissue thickening and metallic susceptibility artifact at the surgical site overlying the great toe. IMPRESSION: 1. Acute osteomyelitis of the first metatarsal head and diaphysis. Bone marrow edema and enhancement extend proximally to the level of the first metatarsal base. There is complex rim enhancing fluid collection over the distal aspect of the first metatarsal base extending more proximally along its plantar aspect measuring up to 2.6 cm suggestive of abscess. 2. Rim enhancing fluid collection at the second MTP joint, which is dorsally dislocated, suggesting septic arthritis. 3. Marrow edema within the second metatarsal and  second digit proximal phalanx where there is low signal changes at the second metatarsal head suggesting acute osteomyelitis. 4. Marrow edema at the third metatarsal head without definite confluent low T1 signal change to suggest acute osteomyelitis. 5. Findings of diffuse myositis and chronic denervation changes. These results will be called to the ordering clinician or representative by the Radiologist Assistant, and communication documented in the PACS or zVision Dashboard. Electronically Signed   By: NDavina PokeM.D.   On: 10/11/2019 22:33   DG Foot 2 Views Left  Result Date: 10/16/2019 CLINICAL DATA:  Left foot surgery EXAM: LEFT FOOT - 2 VIEW COMPARISON:  10/13/2019 FINDINGS: Further interval resection of the first metatarsal with amputation at the proximal metaphysis. Interval second ray resection at the mid to distal second metatarsal diaphysis. There is radiopaque antibiotic bead placement at the resection site. Overlying skin staples. Osseous structures appear otherwise intact. Small bidirectional calcaneal enthesophytes. IMPRESSION: First and second transmetatarsal amputations of the left foot with antibiotic bead placement. Electronically Signed  By: Davina Poke M.D.   On: 10/16/2019 15:47   DG Foot 2 Views Left  Result Date: 10/13/2019 CLINICAL DATA:  Postoperative EXAM: LEFT FOOT - 2 VIEW COMPARISON:  10/11/2019 FINDINGS: Interval postoperative findings of revision transmetatarsal amputation of the left first digit. Osseous structures are otherwise intact. Joint spaces are well preserved. Bandage material about the surgical site. IMPRESSION: Interval postoperative findings of revision transmetatarsal amputation of the left first digit. Electronically Signed   By: Eddie Candle M.D.   On: 10/13/2019 13:26   DG Foot Complete Left  Result Date: 10/14/2019 Please see detailed radiograph report in office note.  DG Foot Complete Left  Result Date: 10/09/2019 Please see detailed  radiograph report in office note.      LAB RESULTS: Basic Metabolic Panel: Recent Labs  Lab 10/11/19 2015 10/17/19 0629 10/18/19 0308  NA  --  131* 133*  K  --  4.9 4.5  CL  --  95* 99  CO2  --  26 27  GLUCOSE  --  188* 136*  BUN  --  26* 31*  CREATININE  --  1.08 1.05  CALCIUM  --  9.3 9.3  MG 2.1  --   --   PHOS 3.2  --   --    Liver Function Tests: Recent Labs  Lab 10/11/19 2016  AST 13*  ALT 12  ALKPHOS 85  BILITOT 0.8  PROT 7.0  ALBUMIN 3.5   No results for input(s): LIPASE, AMYLASE in the last 168 hours. No results for input(s): AMMONIA in the last 168 hours. CBC: Recent Labs  Lab 10/11/19 2015 10/17/19 0629 10/18/19 0308  WBC 5.6 4.7 4.1  NEUTROABS 3.9  --   --   HGB 9.0* 8.3* 7.7*  HCT 28.4* 26.2* 24.6*  MCV 96.9 98.1 96.5  PLT 127* 158 177   Cardiac Enzymes: No results for input(s): CKTOTAL, CKMB, CKMBINDEX, TROPONINI in the last 168 hours. BNP: Invalid input(s): POCBNP CBG: Recent Labs  Lab 10/17/19 2153 10/18/19 0746  GLUCAP 148* 150*      Disposition and Follow-up: Discharge Instructions    Increase activity slowly   Complete by: As directed        DISPOSITION: Home   DISCHARGE FOLLOW-UP Follow-up Information    Evelina Bucy, DPM. Schedule an appointment as soon as possible for a visit in 2 week(s).   Specialty: Podiatry Contact information: [address] [place] [address] [phone]            Time coordinating discharge:  35-minute  Signed:   Estill Cotta M.D. [facility] 10/18/2019, 12:16 PM

## 2019-10-18 NOTE — Plan of Care (Signed)
Patient ok for d/c from podiatry standpoint. Leave dressing intact until f/u. Recommend 2 weeks coverage for MSSA will extend if second bone culture positive.

## 2019-10-18 NOTE — TOC Transition Note (Signed)
Transition of Care [facility]) - CM/SW Discharge Note   Patient Details  Name: Eugene Diaz MRN: [id number] Date of Birth: 1966-04-29  Transition of Care [facility]) CM/SW Contact:  Wende Neighbors, LCSW Phone Number: 10/18/2019, 11:21 AM   Clinical Narrative:   CSW spoke to patient via phone. Patient stated his friend will pick him up at discharge. Patient declined rolling walker stating he has one at room already           Patient Goals and CMS Choice        Discharge Placement                       Discharge Plan and Services                                     Social Determinants of Health (SDOH) Interventions     Readmission Risk Interventions No flowsheet data found.

## 2019-10-18 NOTE — Progress Notes (Signed)
Doxy and Norco sent to patient for discharge meds.

## 2019-10-19 LAB — AEROBIC/ANAEROBIC CULTURE W GRAM STAIN (SURGICAL/DEEP WOUND): Culture: NO GROWTH

## 2019-10-20 LAB — TYPE AND SCREEN
ABO/RH(D): A POS
Antibody Screen: NEGATIVE
Unit division: 0

## 2019-10-20 LAB — BPAM RBC
Blood Product Expiration Date: 202101102359
ISSUE DATE / TIME: 202012181000
Unit Type and Rh: 6200

## 2019-10-21 ENCOUNTER — Other Ambulatory Visit: Payer: Self-pay

## 2019-10-21 ENCOUNTER — Encounter: Payer: Self-pay | Admitting: Podiatry

## 2019-10-21 ENCOUNTER — Ambulatory Visit (INDEPENDENT_AMBULATORY_CARE_PROVIDER_SITE_OTHER): Payer: Medicaid Other | Admitting: Podiatry

## 2019-10-21 DIAGNOSIS — Z9889 Other specified postprocedural states: Secondary | ICD-10-CM

## 2019-10-21 DIAGNOSIS — M86172 Other acute osteomyelitis, left ankle and foot: Secondary | ICD-10-CM

## 2019-10-21 DIAGNOSIS — L03032 Cellulitis of left toe: Secondary | ICD-10-CM

## 2019-10-21 DIAGNOSIS — L02612 Cutaneous abscess of left foot: Secondary | ICD-10-CM

## 2019-10-21 LAB — SURGICAL PATHOLOGY

## 2019-10-21 NOTE — Progress Notes (Signed)
Subjective:  Patient ID: Eugene Diaz, male    DOB: 1966/02/06,  MRN: [id number]  Chief Complaint  Patient presents with  . Routine Post Op    POV #5  DOS 09/04/2019 SECONDARY/DELAYED CLOSURE LT, pt states that he has been keeping the dressings dry, pt also had a smell, as well as warmth around the surgical area    10/06/19 Left foot debridemen tWith left partial second ray resection with antibiotic beads placement  53 y.o. male returns for post-op check.  Patient states he is doing well.  He has been ambulating in a cam boot.  He states that he is keeping it dry clean and intact.  He denies any other acute complaints.  He states that he has not taken the dressing down or gotten worse.  Review of Systems: Negative except as noted in the HPI. Denies N/V/F/Ch.  Past Medical History:  Diagnosis Date  . Alcohol abuse   . Alcoholic peripheral neuropathy ([place])   . Anemia   . Anxiety   . Arthritis   . Asthma    followed by pcp  . B12 deficiency   . Cellulitis of left lower leg    07-2019 and left foot  . Depression   . Diabetic neuropathy ([place])   . Essential tremor    neurologist-- dr Timmy Bubeck--- due to alcohol abuse  . GERD (gastroesophageal reflux disease)   . History of esophageal stricture    s/p  dilatation 02-13-2018  . Hypercholesterolemia   . Hypertension    followed by pcp  (nuclear stress test 03-11-2014 low risk w/ no ischemia, ef 65%  . Osteomyelitis of great toe ([place])    left foot s/p amputation 08-14-2019  . Peripheral vascular disease ([place])   . Thrombocytopenia (HCC)    chronic  . Type 2 diabetes mellitus ([place])    followed by pcp--- last A1c in epic 6.6 on 07-12-2019  . Wound dehiscence, surgical    post left great toe amputation 08-14-2019    Current Outpatient Medications:  .  ACCU-CHEK GUIDE test strip, USE TO TEST BLOOD SUGARTDAILY AS DIRECTED., Disp: , Rfl:  .  Accu-Chek Softclix Lancets lancets, USE TO TEST BLOOD SUGARADAILY AS DIRECTED., Disp: , Rfl:    .  albuterol (PROVENTIL HFA;VENTOLIN HFA) 108 (90 Base) MCG/ACT inhaler, Inhale 2 puffs into the lungs every 6 (six) hours as needed for wheezing or shortness of breath., Disp: 1 Inhaler, Rfl: 2 .  amitriptyline (ELAVIL) 100 MG tablet, Take 100 mg by mouth at bedtime., Disp: , Rfl:  .  atorvastatin (LIPITOR) 40 MG tablet, Take 40 mg by mouth every evening. , Disp: , Rfl: 10 .  Blood Glucose Monitoring Suppl (ACCU-CHEK GUIDE) w/Device KIT, USE TO TEST BLOOD SUGARAAS DIRECTED BY DOCTOR., Disp: , Rfl:  .  docusate sodium (COLACE) 100 MG capsule, Take 100 mg by mouth daily as needed for mild constipation., Disp: , Rfl:  .  doxycycline (VIBRA-TABS) 100 MG tablet, Take 1 tablet (100 mg total) by mouth 2 (two) times daily., Disp: 28 tablet, Rfl: 0 .  furosemide (LASIX) 20 MG tablet, Take 20 mg by mouth daily. , Disp: , Rfl:  .  gabapentin (NEURONTIN) 300 MG capsule, Take 920m in the morning and at noon, then take 12031mat bedtime (Patient taking differently: Take 900-1,200 mg by mouth See admin instructions. Take 90081mn the morning and at noon, then take 1200m9m bedtime), Disp: 300 capsule, Rfl: 11 .  glimepiride (AMARYL) 4 MG tablet, Take 4  mg by mouth daily., Disp: , Rfl:  .  glycopyrrolate (ROBINUL) 1 MG tablet, Take 1 mg by mouth 2 (two) times daily. , Disp: , Rfl: 2 .  HYDROcodone-acetaminophen (NORCO) 5-325 MG tablet, Take 1 tablet by mouth every 6 (six) hours as needed for moderate pain., Disp: 30 tablet, Rfl: 0 .  hydrOXYzine (ATARAX/VISTARIL) 25 MG tablet, Take 25 mg by mouth 4 (four) times daily as needed for anxiety. , Disp: , Rfl:  .  lidocaine (XYLOCAINE) 5 % ointment, APPLY TO AFFECTED AREAS TWICE DAILY AS NEEDED. (Patient taking differently: Apply 1 application topically 2 (two) times daily as needed for mild pain. ), Disp: 70.88 g, Rfl: 3 .  lisinopril (PRINIVIL,ZESTRIL) 20 MG tablet, Take 20 mg by mouth daily. , Disp: , Rfl: 4 .  omeprazole (PRILOSEC) 20 MG capsule, Take 1 capsule (20  mg total) by mouth 2 (two) times daily before a meal. 1 PO 30 mins prior to breakfast and supper (Patient taking differently: Take 20 mg by mouth 2 (two) times daily before a meal. ), Disp: 60 capsule, Rfl: 5 .  ondansetron (ZOFRAN ODT) 4 MG disintegrating tablet, Take 1 tablet (4 mg total) by mouth every 8 (eight) hours as needed for nausea or vomiting., Disp: 20 tablet, Rfl: 0 .  primidone (MYSOLINE) 50 MG tablet, Take 1 tablet (50 mg total) by mouth 2 (two) times daily. (Patient taking differently: Take 50 mg by mouth 2 (two) times daily. ), Disp: 60 tablet, Rfl: 11 .  VOLTAREN 1 % GEL, Apply 2 g topically 4 (four) times daily. , Disp: , Rfl:   Social History   Tobacco Use  Smoking Status Former Smoker  . Packs/day: 2.50  . Years: 25.00  . Pack years: 62.50  . Types: Cigarettes  . Quit date: 08/05/1984  . Years since quitting: 35.2  Smokeless Tobacco Never Used    No Known Allergies Objective:  There were no vitals filed for this visit. There is no height or weight on file to calculate BMI. Constitutional Well developed. Well nourished.  Vascular Foot warm and well perfused. Capillary refill normal to all digits.   Neurologic Normal speech. Oriented to person, place, and time. Epicritic sensation to light touch grossly present bilaterally.  Dermatologic Skin healing well without signs of infection. Skin edges well coapted without signs of infection.  Orthopedic: Tenderness to palpation noted about the surgical site.   Radiographs: None Assessment:   1. Post-operative state   2. Acute osteomyelitis of toe of left foot (HCC)   3. Cellulitis and abscess of toe of left foot    Plan:  Patient was evaluated and treated and all questions answered.  S/p foot surgery left -Progressing as expected post-operatively. -XR: None -WB Status: Weightbearing as tolerated in cam boot -Sutures: Sutures and staples intact.  Maceration present around incision site extending plantarly.  No  signs of dehiscence noted.  No other clinical signs of infection noted. -Medications: None. -Foot redressed.  -Aggressive Betadine wet-to-dry dressing was applied to resolve the macerated tissue.  Patient will be scheduled see [name] within 1 week.  No follow-ups on file.

## 2019-10-29 ENCOUNTER — Encounter: Payer: Self-pay | Admitting: Anesthesiology

## 2019-10-29 NOTE — Addendum Note (Signed)
Addendum  created 10/29/19 1317 by Pervis Hocking, DO   Intraprocedure Event edited, Intraprocedure Staff edited

## 2019-11-03 NOTE — Progress Notes (Signed)
Subjective:  Patient ID: Eugene Diaz, male    DOB: 1966-09-01,  MRN: [id number]  Chief Complaint  Patient presents with  . Routine Post Op    DOS 09/04/2019 SECONDARY/DELAYED CLOSURE LT. Pt states no concerns. Denies fever/nausea/vomiting/chills. Some staples are coming loose.   DOS: 07/16/2019 Procedure: Open reduction, perc pinning left hallux fracture, incision and drainage left hallux.  DOS: 08/14/2019 Procedure: amoutaton left hallux  DOS: 09/04/2019 Procedure: Delayed wound closure - repair of complex dehiscence  54 y.o. male returns for post-op check Has been taking his abx once daily only at night. Hs gotten his dressing wet. Has been walking a lot on his foot despite restrictions.  Review of Systems: Negative except as noted in the HPI. Denies N/V/F/Ch.  Past Medical History:  Diagnosis Date  . Alcohol abuse   . Alcoholic peripheral neuropathy ([place])   . Anemia   . Anxiety   . Arthritis   . Asthma    followed by pcp  . B12 deficiency   . Cellulitis of left lower leg    07-2019 and left foot  . Depression   . Diabetic neuropathy ([place])   . Essential tremor    neurologist-- dr patel--- due to alcohol abuse  . GERD (gastroesophageal reflux disease)   . History of esophageal stricture    s/p  dilatation 02-13-2018  . Hypercholesterolemia   . Hypertension    followed by pcp  (nuclear stress test 03-11-2014 low risk w/ no ischemia, ef 65%  . Osteomyelitis of great toe ([place])    left foot s/p amputation 08-14-2019  . Peripheral vascular disease ([place])   . Thrombocytopenia (HCC)    chronic  . Type 2 diabetes mellitus ([place])    followed by pcp--- last A1c in epic 6.6 on 07-12-2019  . Wound dehiscence, surgical    post left great toe amputation 08-14-2019    Current Outpatient Medications:  .  ACCU-CHEK GUIDE test strip, USE TO TEST BLOOD SUGARTDAILY AS DIRECTED., Disp: , Rfl:  .  Accu-Chek Softclix Lancets lancets, USE TO TEST BLOOD SUGARADAILY AS DIRECTED.,  Disp: , Rfl:  .  albuterol (PROVENTIL HFA;VENTOLIN HFA) 108 (90 Base) MCG/ACT inhaler, Inhale 2 puffs into the lungs every 6 (six) hours as needed for wheezing or shortness of breath., Disp: 1 Inhaler, Rfl: 2 .  amitriptyline (ELAVIL) 100 MG tablet, Take 100 mg by mouth at bedtime., Disp: , Rfl:  .  atorvastatin (LIPITOR) 40 MG tablet, Take 40 mg by mouth every evening. , Disp: , Rfl: 10 .  Blood Glucose Monitoring Suppl (ACCU-CHEK GUIDE) w/Device KIT, USE TO TEST BLOOD SUGARAAS DIRECTED BY DOCTOR., Disp: , Rfl:  .  docusate sodium (COLACE) 100 MG capsule, Take 100 mg by mouth daily as needed for mild constipation., Disp: , Rfl:  .  furosemide (LASIX) 20 MG tablet, Take 20 mg by mouth daily. , Disp: , Rfl:  .  gabapentin (NEURONTIN) 300 MG capsule, Take '900mg'$  in the morning and at noon, then take '1200mg'$  at bedtime (Patient taking differently: Take 900-1,200 mg by mouth See admin instructions. Take '900mg'$  in the morning and at noon, then take '1200mg'$  at bedtime), Disp: 300 capsule, Rfl: 11 .  glimepiride (AMARYL) 4 MG tablet, Take 4 mg by mouth daily., Disp: , Rfl:  .  glycopyrrolate (ROBINUL) 1 MG tablet, Take 1 mg by mouth 2 (two) times daily. , Disp: , Rfl: 2 .  hydrOXYzine (ATARAX/VISTARIL) 25 MG tablet, Take 25 mg by mouth 4 (four) times daily  as needed for anxiety. , Disp: , Rfl:  .  lidocaine (XYLOCAINE) 5 % ointment, APPLY TO AFFECTED AREAS TWICE DAILY AS NEEDED. (Patient taking differently: Apply 1 application topically 2 (two) times daily as needed for mild pain. ), Disp: 70.88 g, Rfl: 3 .  lisinopril (PRINIVIL,ZESTRIL) 20 MG tablet, Take 20 mg by mouth daily. , Disp: , Rfl: 4 .  omeprazole (PRILOSEC) 20 MG capsule, Take 1 capsule (20 mg total) by mouth 2 (two) times daily before a meal. 1 PO 30 mins prior to breakfast and supper (Patient taking differently: Take 20 mg by mouth 2 (two) times daily before a meal. ), Disp: 60 capsule, Rfl: 5 .  primidone (MYSOLINE) 50 MG tablet, Take 1 tablet (50  mg total) by mouth 2 (two) times daily. (Patient taking differently: Take 50 mg by mouth 2 (two) times daily. ), Disp: 60 tablet, Rfl: 11 .  VOLTAREN 1 % GEL, Apply 2 g topically 4 (four) times daily. , Disp: , Rfl:  .  doxycycline (VIBRA-TABS) 100 MG tablet, Take 1 tablet (100 mg total) by mouth 2 (two) times daily., Disp: 28 tablet, Rfl: 0 .  HYDROcodone-acetaminophen (NORCO) 5-325 MG tablet, Take 1 tablet by mouth every 6 (six) hours as needed for moderate pain., Disp: 30 tablet, Rfl: 0 .  ondansetron (ZOFRAN ODT) 4 MG disintegrating tablet, Take 1 tablet (4 mg total) by mouth every 8 (eight) hours as needed for nausea or vomiting., Disp: 20 tablet, Rfl: 0  Social History   Tobacco Use  Smoking Status Former Smoker  . Packs/day: 2.50  . Years: 25.00  . Pack years: 62.50  . Types: Cigarettes  . Quit date: 08/05/1984  . Years since quitting: 35.2  Smokeless Tobacco Never Used    No Known Allergies Objective:  There were no vitals filed for this visit. There is no height or weight on file to calculate BMI. Constitutional Well developed. Well nourished.  Vascular Foot warm and well perfused. Capillary refill normal to all digits.   Neurologic Normal speech. Oriented to person, place, and time. Epicritic sensation to light touch grossly present bilaterally.  Dermatologic Wound edges with some suture failure and edema. Maceration, edema, no signs of acute infection   Orthopedic:  History of left hallux amputation   Radiographs: No x-rays taken today Assessment:   1. Post-operative state   2. Localized edema    Plan:  Patient was evaluated and treated and all questions answered.  S/p foot surgery left -Dressing reapplied we will follow-up next week for recheck discussed importance of compliance of weightbearing restrictions and keeping the dressing dry.  Staples left intact  Return in about 1 week (around 10/10/2019) for POV/Wound  care left.

## 2019-11-04 ENCOUNTER — Ambulatory Visit (INDEPENDENT_AMBULATORY_CARE_PROVIDER_SITE_OTHER): Payer: Medicaid Other | Admitting: Podiatry

## 2019-11-04 ENCOUNTER — Other Ambulatory Visit: Payer: Self-pay

## 2019-11-04 DIAGNOSIS — T8130XA Disruption of wound, unspecified, initial encounter: Secondary | ICD-10-CM

## 2019-11-04 DIAGNOSIS — Z9889 Other specified postprocedural states: Secondary | ICD-10-CM

## 2019-11-04 DIAGNOSIS — E1142 Type 2 diabetes mellitus with diabetic polyneuropathy: Secondary | ICD-10-CM

## 2019-11-05 ENCOUNTER — Encounter: Payer: Self-pay | Admitting: Podiatry

## 2019-11-05 NOTE — Progress Notes (Signed)
Subjective:  Patient ID: Eugene Diaz, male    DOB: 01-08-1966,  MRN: [id number]  Chief Complaint  Patient presents with  . Routine Post Op    POV#6  DOS 09/04/2019 SECONDARY/DELAYED CLOSURE LT. Pt currently has a smell to the right foot, pt's staples might also might not fully be intact    10/06/19 Left foot debridemen With left partial second ray resection with antibiotic beads placement  54 y.o. male returns for post-op check.  Patient states he is doing well.  He has been ambulating in a cam boot.  He states that he is keeping it dry clean and intact.  He denies any other acute complaints.  He states that he has not taken the dressing down or gotten worse.  Review of Systems: Negative except as noted in the HPI. Denies N/V/F/Ch.  Past Medical History:  Diagnosis Date  . Alcohol abuse   . Alcoholic peripheral neuropathy ([place])   . Anemia   . Anxiety   . Arthritis   . Asthma    followed by pcp  . B12 deficiency   . Cellulitis of left lower leg    07-2019 and left foot  . Depression   . Diabetic neuropathy ([place])   . Essential tremor    neurologist-- dr Donshay Lupinski--- due to alcohol abuse  . GERD (gastroesophageal reflux disease)   . History of esophageal stricture    s/p  dilatation 02-13-2018  . Hypercholesterolemia   . Hypertension    followed by pcp  (nuclear stress test 03-11-2014 low risk w/ no ischemia, ef 65%  . Osteomyelitis of great toe ([place])    left foot s/p amputation 08-14-2019  . Peripheral vascular disease ([place])   . Thrombocytopenia (HCC)    chronic  . Type 2 diabetes mellitus ([place])    followed by pcp--- last A1c in epic 6.6 on 07-12-2019  . Wound dehiscence, surgical    post left great toe amputation 08-14-2019    Current Outpatient Medications:  .  ACCU-CHEK GUIDE test strip, USE TO TEST BLOOD SUGARTDAILY AS DIRECTED., Disp: , Rfl:  .  Accu-Chek Softclix Lancets lancets, USE TO TEST BLOOD SUGARADAILY AS DIRECTED., Disp: , Rfl:  .  albuterol (PROVENTIL  HFA;VENTOLIN HFA) 108 (90 Base) MCG/ACT inhaler, Inhale 2 puffs into the lungs every 6 (six) hours as needed for wheezing or shortness of breath., Disp: 1 Inhaler, Rfl: 2 .  amitriptyline (ELAVIL) 100 MG tablet, Take 100 mg by mouth at bedtime., Disp: , Rfl:  .  atorvastatin (LIPITOR) 40 MG tablet, Take 40 mg by mouth every evening. , Disp: , Rfl: 10 .  Blood Glucose Monitoring Suppl (ACCU-CHEK GUIDE) w/Device KIT, USE TO TEST BLOOD SUGARAAS DIRECTED BY DOCTOR., Disp: , Rfl:  .  docusate sodium (COLACE) 100 MG capsule, Take 100 mg by mouth daily as needed for mild constipation., Disp: , Rfl:  .  doxycycline (VIBRA-TABS) 100 MG tablet, Take 1 tablet (100 mg total) by mouth 2 (two) times daily., Disp: 28 tablet, Rfl: 0 .  furosemide (LASIX) 20 MG tablet, Take 20 mg by mouth daily. , Disp: , Rfl:  .  gabapentin (NEURONTIN) 300 MG capsule, Take '900mg'$  in the morning and at noon, then take '1200mg'$  at bedtime (Patient taking differently: Take 900-1,200 mg by mouth See admin instructions. Take '900mg'$  in the morning and at noon, then take '1200mg'$  at bedtime), Disp: 300 capsule, Rfl: 11 .  glimepiride (AMARYL) 4 MG tablet, Take 4 mg by mouth daily., Disp: , Rfl:  .  glycopyrrolate (ROBINUL) 1 MG tablet, Take 1 mg by mouth 2 (two) times daily. , Disp: , Rfl: 2 .  HYDROcodone-acetaminophen (NORCO) 5-325 MG tablet, Take 1 tablet by mouth every 6 (six) hours as needed for moderate pain., Disp: 30 tablet, Rfl: 0 .  hydrOXYzine (ATARAX/VISTARIL) 25 MG tablet, Take 25 mg by mouth 4 (four) times daily as needed for anxiety. , Disp: , Rfl:  .  lidocaine (XYLOCAINE) 5 % ointment, APPLY TO AFFECTED AREAS TWICE DAILY AS NEEDED. (Patient taking differently: Apply 1 application topically 2 (two) times daily as needed for mild pain. ), Disp: 70.88 g, Rfl: 3 .  lisinopril (PRINIVIL,ZESTRIL) 20 MG tablet, Take 20 mg by mouth daily. , Disp: , Rfl: 4 .  omeprazole (PRILOSEC) 20 MG capsule, Take 1 capsule (20 mg total) by mouth 2  (two) times daily before a meal. 1 PO 30 mins prior to breakfast and supper (Patient taking differently: Take 20 mg by mouth 2 (two) times daily before a meal. ), Disp: 60 capsule, Rfl: 5 .  ondansetron (ZOFRAN ODT) 4 MG disintegrating tablet, Take 1 tablet (4 mg total) by mouth every 8 (eight) hours as needed for nausea or vomiting., Disp: 20 tablet, Rfl: 0 .  primidone (MYSOLINE) 50 MG tablet, Take 1 tablet (50 mg total) by mouth 2 (two) times daily. (Patient taking differently: Take 50 mg by mouth 2 (two) times daily. ), Disp: 60 tablet, Rfl: 11 .  VOLTAREN 1 % GEL, Apply 2 g topically 4 (four) times daily. , Disp: , Rfl:   Social History   Tobacco Use  Smoking Status Former Smoker  . Packs/day: 2.50  . Years: 25.00  . Pack years: 62.50  . Types: Cigarettes  . Quit date: 08/05/1984  . Years since quitting: 35.2  Smokeless Tobacco Never Used    No Known Allergies Objective:  There were no vitals filed for this visit. There is no height or weight on file to calculate BMI. Constitutional Well developed. Well nourished.  Vascular Foot warm and well perfused. Capillary refill normal to all digits.   Neurologic Normal speech. Oriented to person, place, and time. Epicritic sensation to light touch grossly present bilaterally.  Dermatologic  dehiscence is noted of the lateral side of the incision.  There is fibrotic/devitalized tissue present on the lateral aspect of the amputation site as well as the plantar aspect of the knee amputation site.  All the distal sutures and staples were taken out.  Probes deep down into the tissue near bone.  No purulent drainage was expressed.  No erythema was noted.  No other clinical signs of infection noted.  Orthopedic: Tenderness to palpation noted about the surgical site.   Radiographs: None Assessment:   1. Post-operative state   2. Wound dehiscence   3. DM type 2 with diabetic peripheral neuropathy ([place])    Plan:  Patient was evaluated and  treated and all questions answered.  S/p foot surgery left -Progressing as expected post-operatively. -XR: None -WB Status: Weightbearing as tolerated in cam boot -Sutures: Distal sutures and staples were removed as they were not holding tension.  Proximal sutures and staples were were intact and still holding tension. -Medications: None. -Foot redressed.  -Given the nature of the dehiscence of the distal amputation site with deep probing very close to the bone, patient is a high risk for limb loss/further foot amputation.  Given the nature of the dehiscence with probing down to bone, patient may need transmetatarsal amputation or more proximal  amputation.  However given that there is no other clinical signs of infection I will defer the care over to [name] for further amputation versus local wound care management. -For now patient will continue with aggressive Betadine wet-to-dry dressing changes as there is maceration present likely due to body fluid/hyperhidrosis.  Patient states will continue Betadine wet-to-dry dressing changes.  -Is scheduled to see [name] this upcoming week.

## 2019-11-08 ENCOUNTER — Ambulatory Visit (INDEPENDENT_AMBULATORY_CARE_PROVIDER_SITE_OTHER): Payer: Medicaid Other

## 2019-11-08 ENCOUNTER — Encounter: Payer: Self-pay | Admitting: Podiatry

## 2019-11-08 ENCOUNTER — Ambulatory Visit (INDEPENDENT_AMBULATORY_CARE_PROVIDER_SITE_OTHER): Payer: Medicaid Other | Admitting: Podiatry

## 2019-11-08 ENCOUNTER — Encounter: Payer: Medicaid Other | Admitting: Podiatry

## 2019-11-08 ENCOUNTER — Other Ambulatory Visit: Payer: Self-pay

## 2019-11-08 VITALS — Temp 96.3°F

## 2019-11-08 DIAGNOSIS — Z9889 Other specified postprocedural states: Secondary | ICD-10-CM

## 2019-11-08 DIAGNOSIS — M86172 Other acute osteomyelitis, left ankle and foot: Secondary | ICD-10-CM | POA: Diagnosis not present

## 2019-11-08 NOTE — Progress Notes (Signed)
  Subjective:  Patient ID: Eugene Diaz, male    DOB: 1966-02-22,  MRN: [id number]  Chief Complaint  Patient presents with  . Routine Post Op    the left foot is smelling and has a odor and i need the bandage changed and not wait a month    54 y.o. male presents with the above complaint. History confirmed with patient.  States that he has been walking on the foot on and off but has been trying not to.  Objective:  Physical Exam:   Dressing fully saturated prior to removal.   Left foot warm edematous and erythematous.  Cellulitis of the leg.  Full-thickness dehiscence of the plantar wound with probe to bone, frank necrosis, serosanguineous drainage.  No crepitus or fluctuance. Incision medially healing well with coapted suture.    Radiographs: X-ray of the left foot: Partial first and second ray amputations with dislocation of the third toe and small worsening lysis of the second metatarsal   Assessment:   1. Post-operative state   2. Acute osteomyelitis of toe of left foot ([place])    Plan:  Patient was evaluated and treated and all questions answered.  Left Foot Wet Gangrene, Cellulitis of leg -Discussed with patient that given progression of infection may at this point he will need admission to the hospital for IV antibiotics and surgery.  We will send him to the emergency room for evaluation and initiation of care.  He will need debridement.  Ultimately he will need transmetatarsal versus Lisfranc amputation.  I did discuss with him he is at risk for below-knee amputation but we can attempt to proceed with salvage. -We discussed importance of keeping the dressing dry, not walking on it, importance of glycemic control, avoiding alcohol. -Betadine wet-to-dry dressing applied. -We will follow-up in the hospital.  Please consult [name] directly for care during admission.  No follow-ups on file.

## 2019-11-10 ENCOUNTER — Other Ambulatory Visit: Payer: Self-pay

## 2019-11-10 ENCOUNTER — Inpatient Hospital Stay (HOSPITAL_COMMUNITY)
Admission: EM | Admit: 2019-11-10 | Discharge: 2019-11-15 | DRG: 617 | Disposition: A | Discharge status: Home Health | Payer: Medicaid Other | Attending: Internal Medicine | Admitting: Internal Medicine

## 2019-11-10 ENCOUNTER — Encounter (HOSPITAL_COMMUNITY): Payer: Self-pay

## 2019-11-10 DIAGNOSIS — E1142 Type 2 diabetes mellitus with diabetic polyneuropathy: Secondary | ICD-10-CM | POA: Diagnosis present

## 2019-11-10 DIAGNOSIS — F1020 Alcohol dependence, uncomplicated: Secondary | ICD-10-CM | POA: Diagnosis present

## 2019-11-10 DIAGNOSIS — Z20822 Contact with and (suspected) exposure to covid-19: Secondary | ICD-10-CM | POA: Diagnosis present

## 2019-11-10 DIAGNOSIS — Z8249 Family history of ischemic heart disease and other diseases of the circulatory system: Secondary | ICD-10-CM | POA: Diagnosis not present

## 2019-11-10 DIAGNOSIS — E669 Obesity, unspecified: Secondary | ICD-10-CM | POA: Diagnosis present

## 2019-11-10 DIAGNOSIS — F1010 Alcohol abuse, uncomplicated: Secondary | ICD-10-CM | POA: Diagnosis not present

## 2019-11-10 DIAGNOSIS — Z9889 Other specified postprocedural states: Secondary | ICD-10-CM

## 2019-11-10 DIAGNOSIS — B965 Pseudomonas (aeruginosa) (mallei) (pseudomallei) as the cause of diseases classified elsewhere: Secondary | ICD-10-CM | POA: Diagnosis present

## 2019-11-10 DIAGNOSIS — Z79899 Other long term (current) drug therapy: Secondary | ICD-10-CM | POA: Diagnosis not present

## 2019-11-10 DIAGNOSIS — E1152 Type 2 diabetes mellitus with diabetic peripheral angiopathy with gangrene: Secondary | ICD-10-CM | POA: Diagnosis present

## 2019-11-10 DIAGNOSIS — F419 Anxiety disorder, unspecified: Secondary | ICD-10-CM | POA: Diagnosis present

## 2019-11-10 DIAGNOSIS — M869 Osteomyelitis, unspecified: Secondary | ICD-10-CM

## 2019-11-10 DIAGNOSIS — Z6834 Body mass index (BMI) 34.0-34.9, adult: Secondary | ICD-10-CM

## 2019-11-10 DIAGNOSIS — F1290 Cannabis use, unspecified, uncomplicated: Secondary | ICD-10-CM | POA: Diagnosis not present

## 2019-11-10 DIAGNOSIS — L089 Local infection of the skin and subcutaneous tissue, unspecified: Secondary | ICD-10-CM | POA: Diagnosis present

## 2019-11-10 DIAGNOSIS — L97524 Non-pressure chronic ulcer of other part of left foot with necrosis of bone: Secondary | ICD-10-CM | POA: Diagnosis present

## 2019-11-10 DIAGNOSIS — E11621 Type 2 diabetes mellitus with foot ulcer: Secondary | ICD-10-CM

## 2019-11-10 DIAGNOSIS — E1169 Type 2 diabetes mellitus with other specified complication: Principal | ICD-10-CM | POA: Diagnosis present

## 2019-11-10 DIAGNOSIS — Z87891 Personal history of nicotine dependence: Secondary | ICD-10-CM

## 2019-11-10 DIAGNOSIS — Z79891 Long term (current) use of opiate analgesic: Secondary | ICD-10-CM | POA: Diagnosis not present

## 2019-11-10 DIAGNOSIS — M86271 Subacute osteomyelitis, right ankle and foot: Secondary | ICD-10-CM

## 2019-11-10 DIAGNOSIS — F329 Major depressive disorder, single episode, unspecified: Secondary | ICD-10-CM | POA: Diagnosis present

## 2019-11-10 DIAGNOSIS — L98494 Non-pressure chronic ulcer of skin of other sites with necrosis of bone: Secondary | ICD-10-CM | POA: Diagnosis present

## 2019-11-10 DIAGNOSIS — E11628 Type 2 diabetes mellitus with other skin complications: Secondary | ICD-10-CM | POA: Diagnosis not present

## 2019-11-10 DIAGNOSIS — E7800 Pure hypercholesterolemia, unspecified: Secondary | ICD-10-CM | POA: Diagnosis present

## 2019-11-10 DIAGNOSIS — Z9119 Patient's noncompliance with other medical treatment and regimen: Secondary | ICD-10-CM

## 2019-11-10 DIAGNOSIS — K219 Gastro-esophageal reflux disease without esophagitis: Secondary | ICD-10-CM | POA: Diagnosis present

## 2019-11-10 DIAGNOSIS — M86272 Subacute osteomyelitis, left ankle and foot: Secondary | ICD-10-CM

## 2019-11-10 DIAGNOSIS — J449 Chronic obstructive pulmonary disease, unspecified: Secondary | ICD-10-CM | POA: Diagnosis present

## 2019-11-10 DIAGNOSIS — Z89422 Acquired absence of other left toe(s): Secondary | ICD-10-CM | POA: Diagnosis not present

## 2019-11-10 DIAGNOSIS — D638 Anemia in other chronic diseases classified elsewhere: Secondary | ICD-10-CM | POA: Diagnosis present

## 2019-11-10 DIAGNOSIS — E0841 Diabetes mellitus due to underlying condition with diabetic mononeuropathy: Secondary | ICD-10-CM | POA: Diagnosis not present

## 2019-11-10 DIAGNOSIS — E785 Hyperlipidemia, unspecified: Secondary | ICD-10-CM | POA: Diagnosis present

## 2019-11-10 DIAGNOSIS — Z7984 Long term (current) use of oral hypoglycemic drugs: Secondary | ICD-10-CM

## 2019-11-10 DIAGNOSIS — M86172 Other acute osteomyelitis, left ankle and foot: Secondary | ICD-10-CM | POA: Diagnosis present

## 2019-11-10 DIAGNOSIS — F1210 Cannabis abuse, uncomplicated: Secondary | ICD-10-CM | POA: Diagnosis present

## 2019-11-10 DIAGNOSIS — M1990 Unspecified osteoarthritis, unspecified site: Secondary | ICD-10-CM | POA: Diagnosis present

## 2019-11-10 DIAGNOSIS — L97424 Non-pressure chronic ulcer of left heel and midfoot with necrosis of bone: Secondary | ICD-10-CM | POA: Diagnosis not present

## 2019-11-10 DIAGNOSIS — E1159 Type 2 diabetes mellitus with other circulatory complications: Secondary | ICD-10-CM | POA: Diagnosis present

## 2019-11-10 DIAGNOSIS — I10 Essential (primary) hypertension: Secondary | ICD-10-CM

## 2019-11-10 DIAGNOSIS — Z8711 Personal history of peptic ulcer disease: Secondary | ICD-10-CM

## 2019-11-10 DIAGNOSIS — I152 Hypertension secondary to endocrine disorders: Secondary | ICD-10-CM | POA: Diagnosis present

## 2019-11-10 DIAGNOSIS — E1140 Type 2 diabetes mellitus with diabetic neuropathy, unspecified: Secondary | ICD-10-CM | POA: Diagnosis present

## 2019-11-10 DIAGNOSIS — T8130XA Disruption of wound, unspecified, initial encounter: Secondary | ICD-10-CM | POA: Diagnosis not present

## 2019-11-10 LAB — BASIC METABOLIC PANEL
Anion gap: 10 (ref 5–15)
BUN: 13 mg/dL (ref 6–20)
CO2: 26 mmol/L (ref 22–32)
Calcium: 8.9 mg/dL (ref 8.9–10.3)
Chloride: 98 mmol/L (ref 98–111)
Creatinine, Ser: 0.98 mg/dL (ref 0.61–1.24)
GFR calc Af Amer: >60 mL/min (ref >60)
GFR calc non Af Amer: >60 mL/min (ref >60)
Glucose, Bld: 244 mg/dL — ABNORMAL HIGH (ref 70–99)
Potassium: 4.2 mmol/L (ref 3.5–5.1)
Sodium: 134 mmol/L — ABNORMAL LOW (ref 135–145)

## 2019-11-10 LAB — CBC
HCT: 27.5 % — ABNORMAL LOW (ref 39.0–52.0)
Hemoglobin: 8.7 g/dL — ABNORMAL LOW (ref 13.0–17.0)
MCH: 29.5 pg (ref 26.0–34.0)
MCHC: 31.6 g/dL (ref 30.0–36.0)
MCV: 93.2 fL (ref 80.0–100.0)
Platelets: 267 10*3/uL (ref 150–400)
RBC: 2.95 MIL/uL — ABNORMAL LOW (ref 4.22–5.81)
RDW: 13.7 % (ref 11.5–15.5)
WBC: 5.6 10*3/uL (ref 4.0–10.5)
nRBC: 0 % (ref 0.0–0.2)

## 2019-11-10 LAB — COMPREHENSIVE METABOLIC PANEL
ALT: 11 U/L (ref 0–44)
AST: 15 U/L (ref 15–41)
Albumin: 2.8 g/dL — ABNORMAL LOW (ref 3.5–5.0)
Alkaline Phosphatase: 95 U/L (ref 38–126)
Anion gap: 10 (ref 5–15)
BUN: 12 mg/dL (ref 6–20)
CO2: 28 mmol/L (ref 22–32)
Calcium: 8.7 mg/dL — ABNORMAL LOW (ref 8.9–10.3)
Chloride: 99 mmol/L (ref 98–111)
Creatinine, Ser: 0.93 mg/dL (ref 0.61–1.24)
GFR calc Af Amer: >60 mL/min (ref >60)
GFR calc non Af Amer: >60 mL/min (ref >60)
Glucose, Bld: 178 mg/dL — ABNORMAL HIGH (ref 70–99)
Potassium: 4.2 mmol/L (ref 3.5–5.1)
Sodium: 137 mmol/L (ref 135–145)
Total Bilirubin: 0.5 mg/dL (ref 0.3–1.2)
Total Protein: 7.7 g/dL (ref 6.5–8.1)

## 2019-11-10 LAB — CBC WITH DIFFERENTIAL/PLATELET
Abs Immature Granulocytes: 0.05 10*3/uL (ref 0.00–0.07)
Basophils Absolute: 0 10*3/uL (ref 0.0–0.1)
Basophils Relative: 1 %
Eosinophils Absolute: 0.1 10*3/uL (ref 0.0–0.5)
Eosinophils Relative: 2 %
HCT: 28.6 % — ABNORMAL LOW (ref 39.0–52.0)
Hemoglobin: 9 g/dL — ABNORMAL LOW (ref 13.0–17.0)
Immature Granulocytes: 1 %
Lymphocytes Relative: 22 %
Lymphs Abs: 1.4 10*3/uL (ref 0.7–4.0)
MCH: 28.7 pg (ref 26.0–34.0)
MCHC: 31.5 g/dL (ref 30.0–36.0)
MCV: 91.1 fL (ref 80.0–100.0)
Monocytes Absolute: 0.3 10*3/uL (ref 0.1–1.0)
Monocytes Relative: 4 %
Neutro Abs: 4.5 10*3/uL (ref 1.7–7.7)
Neutrophils Relative %: 70 %
Platelets: 309 10*3/uL (ref 150–400)
RBC: 3.14 MIL/uL — ABNORMAL LOW (ref 4.22–5.81)
RDW: 13.7 % (ref 11.5–15.5)
WBC: 6.4 10*3/uL (ref 4.0–10.5)
nRBC: 0 % (ref 0.0–0.2)

## 2019-11-10 LAB — CBG MONITORING, ED: Glucose-Capillary: 143 mg/dL — ABNORMAL HIGH (ref 70–99)

## 2019-11-10 LAB — HEMOGLOBIN A1C
Hgb A1c MFr Bld: 6.4 % — ABNORMAL HIGH (ref 4.8–5.6)
Mean Plasma Glucose: 136.98 mg/dL

## 2019-11-10 LAB — C-REACTIVE PROTEIN: CRP: 7.6 mg/dL — ABNORMAL HIGH (ref <1.0)

## 2019-11-10 LAB — MAGNESIUM: Magnesium: 1.8 mg/dL (ref 1.7–2.4)

## 2019-11-10 LAB — SEDIMENTATION RATE: Sed Rate: 97 mm/hr — ABNORMAL HIGH (ref 0–16)

## 2019-11-10 LAB — PHOSPHORUS: Phosphorus: 3.6 mg/dL (ref 2.5–4.6)

## 2019-11-10 LAB — GLUCOSE, CAPILLARY: Glucose-Capillary: 193 mg/dL — ABNORMAL HIGH (ref 70–99)

## 2019-11-10 MED ORDER — LISINOPRIL 20 MG PO TABS
20.0000 mg | ORAL_TABLET | Freq: Every day | ORAL | Status: DC
  Administered 2019-11-10 – 2019-11-15 (×5): 20 mg via ORAL
  Filled 2019-11-10 (×6): qty 1

## 2019-11-10 MED ORDER — ONDANSETRON HCL 4 MG PO TABS: 4.0000 mg | ORAL_TABLET | Freq: Four times a day (QID) | ORAL | Status: DC | PRN

## 2019-11-10 MED ORDER — GLYCOPYRROLATE 1 MG PO TABS
1.0000 mg | ORAL_TABLET | Freq: Two times a day (BID) | ORAL | Status: DC
  Administered 2019-11-10 – 2019-11-15 (×9): 1 mg via ORAL
  Filled 2019-11-10 (×11): qty 1

## 2019-11-10 MED ORDER — VANCOMYCIN HCL 1500 MG/300ML IV SOLN
1500.0000 mg | INTRAVENOUS | Status: AC
  Administered 2019-11-10: 1500 mg via INTRAVENOUS
  Filled 2019-11-10: qty 300

## 2019-11-10 MED ORDER — ACETAMINOPHEN 325 MG PO TABS
650.0000 mg | ORAL_TABLET | Freq: Four times a day (QID) | ORAL | Status: DC | PRN
  Administered 2019-11-10 – 2019-11-14 (×2): 650 mg via ORAL
  Filled 2019-11-10 (×3): qty 2

## 2019-11-10 MED ORDER — ALBUTEROL SULFATE (2.5 MG/3ML) 0.083% IN NEBU: 2.5000 mg | INHALATION_SOLUTION | Freq: Four times a day (QID) | RESPIRATORY_TRACT | Status: DC | PRN

## 2019-11-10 MED ORDER — HYDROXYZINE HCL 25 MG PO TABS: 25.0000 mg | ORAL_TABLET | Freq: Four times a day (QID) | ORAL | Status: DC | PRN

## 2019-11-10 MED ORDER — LORAZEPAM 1 MG PO TABS: 1.0000 mg | ORAL_TABLET | ORAL | Status: DC | PRN

## 2019-11-10 MED ORDER — VANCOMYCIN HCL 1250 MG/250ML IV SOLN
1250.0000 mg | Freq: Two times a day (BID) | INTRAVENOUS | Status: DC
  Administered 2019-11-11: 1250 mg via INTRAVENOUS
  Filled 2019-11-10: qty 250

## 2019-11-10 MED ORDER — ONDANSETRON HCL 4 MG/2ML IJ SOLN: 4.0000 mg | Freq: Four times a day (QID) | INTRAMUSCULAR | Status: DC | PRN

## 2019-11-10 MED ORDER — FOLIC ACID 1 MG PO TABS
1.0000 mg | ORAL_TABLET | Freq: Every day | ORAL | Status: DC
  Administered 2019-11-12 – 2019-11-15 (×4): 1 mg via ORAL
  Filled 2019-11-10 (×4): qty 1

## 2019-11-10 MED ORDER — THIAMINE HCL 100 MG/ML IJ SOLN
100.0000 mg | Freq: Every day | INTRAMUSCULAR | Status: DC
  Filled 2019-11-10 (×5): qty 1

## 2019-11-10 MED ORDER — FOLIC ACID 1 MG PO TABS: 1.0000 mg | ORAL_TABLET | Freq: Every day | ORAL | Status: DC

## 2019-11-10 MED ORDER — MAGNESIUM HYDROXIDE 400 MG/5ML PO SUSP: 30.0000 mL | Freq: Every day | ORAL | Status: DC | PRN

## 2019-11-10 MED ORDER — PANTOPRAZOLE SODIUM 40 MG PO TBEC
40.0000 mg | DELAYED_RELEASE_TABLET | Freq: Every day | ORAL | Status: DC
  Administered 2019-11-10 – 2019-11-15 (×5): 40 mg via ORAL
  Filled 2019-11-10 (×4): qty 1

## 2019-11-10 MED ORDER — ADULT MULTIVITAMIN W/MINERALS CH
1.0000 | ORAL_TABLET | Freq: Every day | ORAL | Status: DC
  Administered 2019-11-12 – 2019-11-15 (×4): 1 via ORAL
  Filled 2019-11-10 (×4): qty 1

## 2019-11-10 MED ORDER — THIAMINE HCL 100 MG PO TABS: 100.0000 mg | ORAL_TABLET | Freq: Every day | ORAL | Status: DC

## 2019-11-10 MED ORDER — LORAZEPAM 2 MG/ML IJ SOLN: 1.0000 mg | INTRAMUSCULAR | Status: DC | PRN

## 2019-11-10 MED ORDER — DOCUSATE SODIUM 100 MG PO CAPS
100.0000 mg | ORAL_CAPSULE | Freq: Two times a day (BID) | ORAL | Status: DC
  Administered 2019-11-10 – 2019-11-15 (×9): 100 mg via ORAL
  Filled 2019-11-10 (×9): qty 1

## 2019-11-10 MED ORDER — SODIUM CHLORIDE 0.9 % IV SOLN: INTRAVENOUS | Status: DC

## 2019-11-10 MED ORDER — GABAPENTIN 400 MG PO CAPS
1200.0000 mg | ORAL_CAPSULE | Freq: Every day | ORAL | Status: DC
  Administered 2019-11-10 – 2019-11-14 (×5): 1200 mg via ORAL
  Filled 2019-11-10 (×6): qty 3

## 2019-11-10 MED ORDER — ENOXAPARIN SODIUM 60 MG/0.6ML ~~LOC~~ SOLN
60.0000 mg | SUBCUTANEOUS | Status: DC
  Administered 2019-11-11 – 2019-11-14 (×4): 60 mg via SUBCUTANEOUS
  Filled 2019-11-10 (×5): qty 0.6

## 2019-11-10 MED ORDER — OXYCODONE HCL 5 MG PO TABS
15.0000 mg | ORAL_TABLET | ORAL | Status: DC | PRN
  Administered 2019-11-11 – 2019-11-15 (×11): 15 mg via ORAL
  Filled 2019-11-10 (×11): qty 3

## 2019-11-10 MED ORDER — DOCUSATE SODIUM 100 MG PO CAPS: 100.0000 mg | ORAL_CAPSULE | Freq: Every day | ORAL | Status: DC | PRN

## 2019-11-10 MED ORDER — THIAMINE HCL 100 MG PO TABS
100.0000 mg | ORAL_TABLET | Freq: Every day | ORAL | Status: DC
  Administered 2019-11-12 – 2019-11-15 (×4): 100 mg via ORAL
  Filled 2019-11-10 (×4): qty 1

## 2019-11-10 MED ORDER — AMITRIPTYLINE HCL 50 MG PO TABS
100.0000 mg | ORAL_TABLET | Freq: Every day | ORAL | Status: DC
  Administered 2019-11-10 – 2019-11-14 (×5): 100 mg via ORAL
  Filled 2019-11-10 (×6): qty 2

## 2019-11-10 MED ORDER — PIPERACILLIN-TAZOBACTAM 3.375 G IVPB 30 MIN: 3.3750 g | Freq: Three times a day (TID) | INTRAVENOUS | Status: DC

## 2019-11-10 MED ORDER — GABAPENTIN 300 MG PO CAPS
900.0000 mg | ORAL_CAPSULE | Freq: Two times a day (BID) | ORAL | Status: DC
  Administered 2019-11-12 – 2019-11-15 (×7): 900 mg via ORAL
  Filled 2019-11-10 (×7): qty 3

## 2019-11-10 MED ORDER — VANCOMYCIN HCL IN DEXTROSE 1-5 GM/200ML-% IV SOLN
1000.0000 mg | Freq: Once | INTRAVENOUS | Status: AC
  Administered 2019-11-10: 1000 mg via INTRAVENOUS
  Filled 2019-11-10: qty 200

## 2019-11-10 MED ORDER — PRIMIDONE 50 MG PO TABS
50.0000 mg | ORAL_TABLET | Freq: Two times a day (BID) | ORAL | Status: DC
  Administered 2019-11-10 – 2019-11-15 (×9): 50 mg via ORAL
  Filled 2019-11-10 (×12): qty 1

## 2019-11-10 MED ORDER — PIPERACILLIN-TAZOBACTAM 3.375 G IVPB
3.3750 g | Freq: Three times a day (TID) | INTRAVENOUS | Status: DC
  Administered 2019-11-11 – 2019-11-15 (×14): 3.375 g via INTRAVENOUS
  Filled 2019-11-10 (×14): qty 50

## 2019-11-10 MED ORDER — LACTINEX PO CHEW
1.0000 | CHEWABLE_TABLET | Freq: Three times a day (TID) | ORAL | Status: DC
  Administered 2019-11-10: 1 via ORAL
  Filled 2019-11-10 (×16): qty 1

## 2019-11-10 MED ORDER — MORPHINE SULFATE (PF) 2 MG/ML IV SOLN
1.0000 mg | INTRAVENOUS | Status: DC | PRN
  Administered 2019-11-11 – 2019-11-14 (×5): 1 mg via INTRAVENOUS
  Filled 2019-11-10 (×5): qty 1

## 2019-11-10 MED ORDER — ATORVASTATIN CALCIUM 40 MG PO TABS
40.0000 mg | ORAL_TABLET | Freq: Every evening | ORAL | Status: DC
  Administered 2019-11-10 – 2019-11-14 (×3): 40 mg via ORAL
  Filled 2019-11-10 (×4): qty 1

## 2019-11-10 MED ORDER — BACID PO TABS: 2.0000 | ORAL_TABLET | Freq: Three times a day (TID) | ORAL | Status: DC

## 2019-11-10 MED ORDER — ACETAMINOPHEN 650 MG RE SUPP
650.0000 mg | Freq: Four times a day (QID) | RECTAL | Status: DC | PRN
  Filled 2019-11-10: qty 1

## 2019-11-10 MED ORDER — INSULIN ASPART 100 UNIT/ML ~~LOC~~ SOLN
0.0000 [IU] | Freq: Three times a day (TID) | SUBCUTANEOUS | Status: DC
  Administered 2019-11-10: 1 [IU] via SUBCUTANEOUS
  Administered 2019-11-11: 2 [IU] via SUBCUTANEOUS
  Filled 2019-11-10: qty 0.09

## 2019-11-10 NOTE — H&P (Addendum)
History and Physical    Eugene Diaz WSF:[id number] DOB: [birth year] DOA: 11/10/2019  PCP: [name], PA-C  Patient coming from: home   I have personally briefly reviewed patient's old medical records in [place]  Chief Complaint: left foot wound infection  HPI: Eugene Diaz is a 54 y.o. male with medical history significant of DM-2 with severe peripheral neuropathy, left diabetic foot infection in the past s/p left foot debridement with left partial second ray resection with antibiotic bead placement on 10/06/19, HTN, alcohol dependent and Marijuana abuse, anemia of chronic diease, presented with a suspected left foot wound infection. Since his last food debridement, he was followed up by podiatrist, initially on 11/04/19, stating he was doing well and ambulating in a cam boot. However, by then his foot already had a bad smell and the staples were partially off suspecting wound dehiscence. So the podiatrist removed the distal sutures and staples as they were not holding tension. Due to the nature of the dehiscence of the distal amputation site with deep probing very close to the bone, patient is a high risk for limb loss/further foot amputation, but pt was lack of clinical signs of infection, Dr. [name] referred patient to be further evaluated by [name] on 11/08/19. [name] diagnosed patient with left foot wet gangrene and leg cellulitis, and instructed patient to go to hospital for admission with iv antibiotics and amputation surgery/debridement (transmetatarsal vs Lisfranc amputation). However, pt misunderstood the instruction and went home instead. Today he thought the hospital can admit him and decided to come. Pt feels tired, but denies pain from the feet as he barely felt the feet. No fever or chills, no sob or chest pain. No dizziness. Appetite fair. Last BM 4 days ago but no abdominal pain or fullness. No dysuria.   ED Course:  Pt does not meet sepsis criteria with normal  temperature and wbc and HR/RR both normal. But his left foot looks frankly infected with fatty issue exposure and bad odor, highly suspecting acute osteomyelitis.   Review of Systems: As per HPI otherwise 10 point review of systems negative.     Past Medical History:  Diagnosis Date  . Alcohol abuse   . Alcoholic peripheral neuropathy ([place])   . Anemia   . Anxiety   . Arthritis   . Asthma    followed by pcp  . B12 deficiency   . Cellulitis of left lower leg    07-2019 and left foot  . Depression   . Diabetic neuropathy ([place])   . Essential tremor    neurologist-- dr patel--- due to alcohol abuse  . GERD (gastroesophageal reflux disease)   . History of esophageal stricture    s/p  dilatation 02-13-2018  . Hypercholesterolemia   . Hypertension    followed by pcp  (nuclear stress test 03-11-2014 low risk w/ no ischemia, ef 65%  . Osteomyelitis of great toe ([place])    left foot s/p amputation 08-14-2019  . Peripheral vascular disease ([place])   . Thrombocytopenia ([facility])    chronic  . Type 2 diabetes mellitus ([place])    followed by pcp--- last A1c in epic 6.6 on 07-12-2019  . Wound dehiscence, surgical    post left great toe amputation 08-14-2019    Past Surgical History:  Procedure Laterality Date  . AMPUTATION Left 10/16/2019   Procedure: Left partial second ray resection; placement of antibiotic beads;  Surgeon: Evelina Bucy, DPM;  Location: [facility];  Service:  Podiatry;  Laterality: Left;  . AMPUTATION TOE Left 08/14/2019   Procedure: AMPUTATION GREAT TOE;  Surgeon: Evelina Bucy, DPM;  Location: [facility];  Service: Podiatry;  Laterality: Left;  . BIOPSY  02/13/2018   Procedure: BIOPSY;  Surgeon: Danie Binder, MD;  [facility];  Service: Endoscopy;;  transverse colon biopsy, gastric biopsy  . COLONOSCOPY WITH PROPOFOL N/A 02/13/2018   Procedure: COLONOSCOPY WITH PROPOFOL;  Surgeon: Danie Binder, MD;  [facility];  Service: Endoscopy;  Laterality:  N/A;  11:15am  . ESOPHAGOGASTRODUODENOSCOPY (EGD) WITH PROPOFOL N/A 02/13/2018   Procedure: ESOPHAGOGASTRODUODENOSCOPY (EGD) WITH PROPOFOL;  Surgeon: Danie Binder, MD;  [facility];  Service: Endoscopy;  Laterality: N/A;  . I & D EXTREMITY Left 07/16/2019   Procedure: Insicion  AND DEBRIDEMENT EXTREMITY;  Surgeon: Evelina Bucy, DPM;  Location: [place];  Service: Podiatry;  Laterality: Left;  . INCISION AND DRAINAGE OF WOUND Left 10/13/2019   Procedure: IRRIGATION AND DEBRIDEMENT WOUND OF LEFT FOOT AND FIRST METATARSAL RESECTION;  Surgeon: Evelina Bucy, DPM;  Location: [facility];  Service: Podiatry;  Laterality: Left;  . Left arm     Left arm repair (tendon and artery)  . PERCUTANEOUS PINNING  07/16/2019   Procedure: Open Reduction Percutaneous Pinning Extremity;  Surgeon: Evelina Bucy, DPM;  Location: [facility];  Service: Podiatry;;  . PILONIDAL CYST EXCISION N/A 08/08/2014   Procedure: CYST EXCISION PILONIDAL EXTENSIVE;  Surgeon: Jamesetta So, MD;  Location: [facility];  Service: General;  Laterality: N/A;  . POLYPECTOMY  02/13/2018   Procedure: POLYPECTOMY;  Surgeon: Danie Binder, MD;  [facility];  Service: Endoscopy;;  transverse colon polyp hs, rectal polyps times 2  . SAVORY DILATION N/A 02/13/2018   Procedure: SAVORY DILATION;  Surgeon: Danie Binder, MD;  [facility];  Service: Endoscopy;  Laterality: N/A;  . WOUND DEBRIDEMENT Left 09/04/2019   Procedure: DEBRIDEMENT WOUND WITH COMPLEX  REPAIR OF DEHISCENCE;  Surgeon: Evelina Bucy, DPM;  Location: [facility];  Service: Podiatry;  Laterality: Left;  Leave patient on stretcher  . WOUND DEBRIDEMENT Left 10/16/2019   Procedure: Left foot wound debridement and closure;  Surgeon: Evelina Bucy, DPM;  [facility];  Service: Podiatry;  Laterality: Left;     reports that he quit smoking about 35 years ago. His smoking use included cigarettes. He has a 62.50 pack-year smoking  history. He has never used smokeless tobacco. He reports current alcohol use of about 50.0 standard drinks of alcohol per week. He reports current drug use. Drug: Marijuana.  No Known Allergies  Family History  Problem Relation Age of Onset  . Heart attack Mother        Living, 61  . Cancer Father        Deceased  . Healthy Brother   . Healthy Sister   . Colon cancer Neg Hx   . Colon polyps Neg Hx      Prior to Admission medications   Medication Sig Start Date End Date Taking? Authorizing Provider  ACCU-CHEK GUIDE test strip USE TO TEST BLOOD SUGARTDAILY AS DIRECTED. 07/20/19   [provider]  Accu-Chek Softclix Lancets lancets USE TO TEST BLOOD SUGARADAILY AS DIRECTED. 03/18/19   [provider]  albuterol (PROVENTIL HFA;VENTOLIN HFA) 108 (90 Base) MCG/ACT inhaler Inhale 2 puffs into the lungs every 6 (six) hours as needed for wheezing or shortness of breath. 10/31/18  Hayden Rasmussen, MD  amitriptyline (ELAVIL) 100 MG tablet Take 100 mg by mouth at bedtime.    [provider]  atorvastatin (LIPITOR) 40 MG tablet Take 40 mg by mouth every evening.  12/17/15   [provider]  Blood Glucose Monitoring Suppl (ACCU-CHEK GUIDE) w/Device KIT USE TO TEST BLOOD SUGARAAS DIRECTED BY DOCTOR. 03/18/19   [provider]  docusate sodium (COLACE) 100 MG capsule Take 100 mg by mouth daily as needed for mild constipation.    [provider]  doxycycline (VIBRA-TABS) 100 MG tablet Take 1 tablet (100 mg total) by mouth 2 (two) times daily. 10/18/19   Evelina Bucy, DPM  furosemide (LASIX) 20 MG tablet Take 20 mg by mouth daily.     [provider]  gabapentin (NEURONTIN) 300 MG capsule Take 943m in the morning and at noon, then take 12032mat bedtime Patient taking differently: Take 900-1,200 mg by mouth See admin instructions. Take 90034mn the morning and at noon, then take 1200m68m bedtime 06/14/19   PateNarda AmberDO  glimepiride  (AMARYL) 4 MG tablet Take 4 mg by mouth daily. 08/10/19   [provider]  glycopyrrolate (ROBINUL) 1 MG tablet Take 1 mg by mouth 2 (two) times daily.  12/17/15   [provider]  HYDROcodone-acetaminophen (NORCO) 5-325 MG tablet Take 1 tablet by mouth every 6 (six) hours as needed for moderate pain. 10/18/19   PricEvelina BucyM  hydrOXYzine (ATARAX/VISTARIL) 25 MG tablet Take 25 mg by mouth 4 (four) times daily as needed for anxiety.  06/17/19   [provider]  lidocaine (XYLOCAINE) 5 % ointment APPLY TO AFFECTED AREAS TWICE DAILY AS NEEDED. Patient taking differently: Apply 1 application topically 2 (two) times daily as needed for mild pain.  04/04/19   Patel, DoniArvin CollardDO  lisinopril (PRINIVIL,ZESTRIL) 20 MG tablet Take 20 mg by mouth daily.  12/30/15   [provider]  omeprazole (PRILOSEC) 20 MG capsule Take 1 capsule (20 mg total) by mouth 2 (two) times daily before a meal. 1 PO 30 mins prior to breakfast and supper Patient taking differently: Take 20 mg by mouth 2 (two) times daily before a meal.  03/05/19   LewiMahala Menghini-C  ondansetron (ZOFRAN ODT) 4 MG disintegrating tablet Take 1 tablet (4 mg total) by mouth every 8 (eight) hours as needed for nausea or vomiting. 10/18/19   Rai, Ripudeep K, MD  oxycodone (ROXICODONE) 30 MG immediate release tablet Take 30 mg by mouth 4 (four) times daily as needed. 11/04/19   [provider]  primidone (MYSOLINE) 50 MG tablet Take 1 tablet (50 mg total) by mouth 2 (two) times daily. Patient taking differently: Take 50 mg by mouth 2 (two) times daily.  06/14/19   Patel, Donika K, DO  VOLTAREN 1 % GEL Apply 2 g topically 4 (four) times daily.  08/19/15   [provider]    Physical Exam: Vitals:   11/10/19 1154 11/10/19 1156 11/10/19 1417  BP: (!) 173/89  106/69  Pulse: 78  69  Resp: 16  16  Temp: 97.6 F (36.4 C)    TempSrc: Oral    SpO2: 99%  96%  Weight:  122.7 kg   Height:  6' 2"  (1.88 m)       Constitutional: NAD, calm, comfortable Vitals:   11/10/19 1154 11/10/19 1156 11/10/19 1417  BP: (!) 173/89  106/69  Pulse: 78  69  Resp: 16  16  Temp: 97.6 F (36.4 C)    TempSrc: Oral    SpO2: 99%  96%  Weight:  122.7 kg   Height:  6' 2"  (1.88 m)    Eyes: PERRL, lids and conjunctivae normal ENMT: Mucous membranes are moist. Posterior pharynx clear of any exudate or lesions.Normal dentition.  Neck: normal, supple, no masses, no thyromegaly Respiratory: clear to auscultation bilaterally, no wheezing, no crackles. Normal respiratory effort. No accessory muscle use.  Cardiovascular: Regular rate and rhythm, no murmurs / rubs / gallops. No extremity edema. 2+ pedal pulses. No carotid bruits.  Abdomen: no tenderness, no masses palpated. No hepatosplenomegaly. Bowel sounds positive.  Musculoskeletal: no clubbing / cyanosis. No joint deformity upper and lower extremities. Good ROM, no contractures. Normal muscle tone.  Skin: no rashes, lesions, ulcers. No induration Neurologic: CN 2-12 grossly intact. Sensation intact, DTR normal. Strength 5/5 in all 4.  Psychiatric: Normal judgment and insight. Alert and oriented x 3. Normal mood.     (Make sure to document decubitus ulcers present on admission -- if possible -- and whether patient has chronic indwelling catheter at time of admission)  Labs on Admission: I have personally reviewed following labs and imaging studies  CBC: Recent Labs  Lab 11/10/19 1350  WBC 6.4  NEUTROABS 4.5  HGB 9.0*  HCT 28.6*  MCV 91.1  PLT 527   Basic Metabolic Panel: Recent Labs  Lab 11/10/19 1350  NA 134*  K 4.2  CL 98  CO2 26  GLUCOSE 244*  BUN 13  CREATININE 0.98  CALCIUM 8.9   GFR: Estimated Creatinine Clearance: 119.9 mL/min (by C-G formula based on SCr of 0.98 mg/dL). Liver Function Tests: No results for input(s): AST, ALT, ALKPHOS, BILITOT, PROT, ALBUMIN in the last 168 hours. No results for input(s): LIPASE, AMYLASE in the last  168 hours. No results for input(s): AMMONIA in the last 168 hours. Coagulation Profile: No results for input(s): INR, PROTIME in the last 168 hours. Cardiac Enzymes: No results for input(s): CKTOTAL, CKMB, CKMBINDEX, TROPONINI in the last 168 hours. BNP (last 3 results) No results for input(s): PROBNP in the last 8760 hours. HbA1C: No results for input(s): HGBA1C in the last 72 hours. CBG: No results for input(s): GLUCAP in the last 168 hours. Lipid Profile: No results for input(s): CHOL, HDL, LDLCALC, TRIG, CHOLHDL, LDLDIRECT in the last 72 hours. Thyroid Function Tests: No results for input(s): TSH, T4TOTAL, FREET4, T3FREE, THYROIDAB in the last 72 hours. Anemia Panel: No results for input(s): VITAMINB12, FOLATE, FERRITIN, TIBC, IRON, RETICCTPCT in the last 72 hours. Urine analysis:    Component Value Date/Time   COLORURINE YELLOW 08/14/2019 [place] 08/14/2019 1232   LABSPEC 1.020 08/14/2019 1232   PHURINE 5.0 08/14/2019 1232   GLUCOSEU NEGATIVE 08/14/2019 1232   HGBUR NEGATIVE 08/14/2019 [place] 08/14/2019 1232   KETONESUR NEGATIVE 08/14/2019 1232   PROTEINUR NEGATIVE 08/14/2019 1232   UROBILINOGEN 4.0 (H) 10/09/2008 1114   NITRITE NEGATIVE 08/14/2019 1232   LEUKOCYTESUR NEGATIVE 08/14/2019 1232    Radiological Exams on Admission: No results found.  EKG: Independently reviewed.   Assessment/Plan Principal Problem:   Acute osteomyelitis of toe of left foot (HCC) Active Problems:   Hypertension   ETOH abuse   Diabetic neuropathy (HCC)   Diabetic infection of left foot ([facility])   Wound dehiscence   Anemia, chronic disease   Acute osteomyelitis of left foot ([place])   Marijuana use     Plan: - admit to  med-surg unit for acute osteomyelitis, cellulitis and wet gangrene of the left foot.  - based on his diabetes background, will initiate iv Vancomycin and zosyn to cover gram positive, negative and anaerobics. No sepsis on  admission. ID consulted for further antibiotics guidance.  - please call [name] tomorrow to confirm the consult and OR time. Not available to discuss on weekend. NPO after midnight in case of OR tomorrow - wound care consult - CIWA protocol in case of alcohol withdrawal - continue Neurontin for neuropathy - monitor Hb, which is still at his baseline - hold off oral DM medication from home, check a1c, start SSI in hospital - please order PT/OT post op for discharge planning  DVT prophylaxis: lovenox, hold before surgery  Code Status: full code  Family Communication: no family at bedside Disposition Plan: home vs SNF, pending clinical course Consults called: ID and podiatry, need to call tomorrow, non-urgent Admission status: med-surg  Coding: total 70 min on this encounter with >50% of time on direct patient care and plan formulation.    Paticia Stack MD [facility] Pager [phone]  If 7PM-7AM, please contact night-coverage [email or web address] Password [facility]  11/10/2019, 4:13 PM

## 2019-11-10 NOTE — Progress Notes (Signed)
Pharmacy Antibiotic Note  Eugene Diaz is a 54 y.o. male admitted on 11/10/2019 with osteomyelitis.  Pharmacy has been consulted for Vancomycin dosing.  Patient received Vancomycin 1gm IV x 1 in ED @ 13:52.  Plan: Give an additional Vancomycin 1500mg  IV x 1 dose now for total loading dose of 2500mg  (20mg /kg) then:  Vancomycin 1250 mg IV Q 12 hrs. Goal AUC 400-550.  Expected AUC: 465   SCr used: 0.98  Zosyn per MD  Follow SCr daily while on both Vancomycin and Zosyn    Height: 6\' 2"  (188 cm) Weight: 270 lb 6.4 oz (122.7 kg) IBW/kg (Calculated) : 82.2  Temp (24hrs), Avg:97.6 F (36.4 C), Min:97.6 F (36.4 C), Max:97.6 F (36.4 C)  Recent Labs  Lab 11/10/19 1350  WBC 6.4  CREATININE 0.98    Estimated Creatinine Clearance: 119.9 mL/min (by C-G formula based on SCr of 0.98 mg/dL).    No Known Allergies  Antimicrobials this admission: 1/10 Vanc >>   1/10 Zosyn >>    Dose adjustments this admission:    Microbiology results: 1/10 Covid: sent  Thank you for allowing pharmacy to be a part of this patient's care.  Everette Rank, PharmD 11/10/2019 4:10 PM

## 2019-11-10 NOTE — ED Triage Notes (Signed)
Patient states he saw his physician on 1/8/2o and was told to come to the Ed for IV antibiotics and surgery/ Patient has cellulitis of the left foot. Patient states he has had 2 toes removed and was suppose to have more removed.

## 2019-11-10 NOTE — ED Provider Notes (Addendum)
[facility] DEPT Provider Note   CSN: [id number] Arrival date & time: 11/10/19  1140     History Chief Complaint  Patient presents with  . Cellulitis    Eugene Diaz is a 54 y.o. male.  54 year old male with history of diabetes presents with known osteomyelitis of his left foot.  Seen by podiatry 2 days ago for similar symptoms.  That note was reviewed.  Patient was supposed to have presented to the ED at that time for IV antibiotics and admission with possible amputation.  Patient did not understand the instructions at that time.  He denies any fever or chills.  No emesis.  Denies any drainage from the foot.  States his foot has been at his baseline.        Past Medical History:  Diagnosis Date  . Alcohol abuse   . Alcoholic peripheral neuropathy ([place])   . Anemia   . Anxiety   . Arthritis   . Asthma    followed by pcp  . B12 deficiency   . Cellulitis of left lower leg    07-2019 and left foot  . Depression   . Diabetic neuropathy ([place])   . Essential tremor    neurologist-- dr patel--- due to alcohol abuse  . GERD (gastroesophageal reflux disease)   . History of esophageal stricture    s/p  dilatation 02-13-2018  . Hypercholesterolemia   . Hypertension    followed by pcp  (nuclear stress test 03-11-2014 low risk w/ no ischemia, ef 65%  . Osteomyelitis of great toe ([place])    left foot s/p amputation 08-14-2019  . Peripheral vascular disease ([place])   . Thrombocytopenia (HCC)    chronic  . Type 2 diabetes mellitus ([place])    followed by pcp--- last A1c in epic 6.6 on 07-12-2019  . Wound dehiscence, surgical    post left great toe amputation 08-14-2019    Patient Active Problem List   Diagnosis Date Noted  . Wound dehiscence 08/31/2019  . Osteomyelitis due to type 2 diabetes mellitus ([place]) 08/14/2019  . Acute osteomyelitis of toe of left foot ([place]) 08/14/2019  . Sepsis ([place]) 08/14/2019  . Hyponatremia 08/14/2019  . Normocytic  anemia 08/14/2019  . B12 deficiency 08/14/2019  . Acute osteomyelitis of left ankle or foot ([place])   . Cellulitis of toe, left   . Diabetic infection of left foot ([place])   . Toe fracture, left 07/13/2019  . Cellulitis 07/12/2019  . GERD (gastroesophageal reflux disease) 12/19/2018  . Hepatic steatosis 06/18/2018  . Gastritis, erosive   . Dysphagia, idiopathic 12/27/2017  . Rectal bleeding 12/27/2017  . Intention tremor 06/03/2016  . Left knee pain 12/04/2015  . Alcoholic peripheral neuropathy ([place]) 10/08/2014  . Diabetic neuropathy ([place]) 10/08/2014  . Chest pain, rule out acute myocardial infarction 03/10/2014  . Thrombocytopenia ([place]) 03/10/2014  . ETOH abuse 03/10/2014  . Chest pain 03/10/2014  . Diabetes mellitus without complication ([place])   . Hypertension   . Hypercholesterolemia     Past Surgical History:  Procedure Laterality Date  . AMPUTATION Left 10/16/2019   Procedure: Left partial second ray resection; placement of antibiotic beads;  Surgeon: Evelina Bucy, DPM;  Location: [facility];  Service: Podiatry;  Laterality: Left;  . AMPUTATION TOE Left 08/14/2019   Procedure: AMPUTATION GREAT TOE;  Surgeon: Evelina Bucy, DPM;  Location: [facility];  Service: Podiatry;  Laterality: Left;  . BIOPSY  02/13/2018   Procedure: BIOPSY;  Surgeon: Oneida Alar,  Marga Melnick, MD;  [facility];  Service: Endoscopy;;  transverse colon biopsy, gastric biopsy  . COLONOSCOPY WITH PROPOFOL N/A 02/13/2018   Procedure: COLONOSCOPY WITH PROPOFOL;  Surgeon: Danie Binder, MD;  [facility];  Service: Endoscopy;  Laterality: N/A;  11:15am  . ESOPHAGOGASTRODUODENOSCOPY (EGD) WITH PROPOFOL N/A 02/13/2018   Procedure: ESOPHAGOGASTRODUODENOSCOPY (EGD) WITH PROPOFOL;  Surgeon: Danie Binder, MD;  [facility];  Service: Endoscopy;  Laterality: N/A;  . I & D EXTREMITY Left 07/16/2019   Procedure: Insicion  AND DEBRIDEMENT EXTREMITY;  Surgeon: Evelina Bucy, DPM;  Location: [employer];  Service: Podiatry;  Laterality: Left;  . INCISION AND DRAINAGE OF WOUND Left 10/13/2019   Procedure: IRRIGATION AND DEBRIDEMENT WOUND OF LEFT FOOT AND FIRST METATARSAL RESECTION;  Surgeon: Evelina Bucy, DPM;  Location: [facility];  Service: Podiatry;  Laterality: Left;  . Left arm     Left arm repair (tendon and artery)  . PERCUTANEOUS PINNING  07/16/2019   Procedure: Open Reduction Percutaneous Pinning Extremity;  Surgeon: Evelina Bucy, DPM;  Location: [facility];  Service: Podiatry;;  . PILONIDAL CYST EXCISION N/A 08/08/2014   Procedure: CYST EXCISION PILONIDAL EXTENSIVE;  Surgeon: Jamesetta So, MD;  Location: [facility];  Service: General;  Laterality: N/A;  . POLYPECTOMY  02/13/2018   Procedure: POLYPECTOMY;  Surgeon: Danie Binder, MD;  [facility];  Service: Endoscopy;;  transverse colon polyp hs, rectal polyps times 2  . SAVORY DILATION N/A 02/13/2018   Procedure: SAVORY DILATION;  Surgeon: Danie Binder, MD;  [facility];  Service: Endoscopy;  Laterality: N/A;  . WOUND DEBRIDEMENT Left 09/04/2019   Procedure: DEBRIDEMENT WOUND WITH COMPLEX  REPAIR OF DEHISCENCE;  Surgeon: Evelina Bucy, DPM;  Location: [facility];  Service: Podiatry;  Laterality: Left;  Leave patient on stretcher  . WOUND DEBRIDEMENT Left 10/16/2019   Procedure: Left foot wound debridement and closure;  Surgeon: Evelina Bucy, DPM;  [facility];  Service: Podiatry;  Laterality: Left;       Family History  Problem Relation Age of Onset  . Heart attack Mother        Living, 41  . Cancer Father        Deceased  . Healthy Brother   . Healthy Sister   . Colon cancer Neg Hx   . Colon polyps Neg Hx     Social History   Tobacco Use  . Smoking status: Former Smoker    Packs/day: 2.50    Years: 25.00    Pack years: 62.50    Types: Cigarettes    Quit date: 08/05/1984    Years since quitting: 35.2  . Smokeless tobacco: Never Used  Substance Use Topics  .  Alcohol use: Yes    Alcohol/week: 50.0 standard drinks    Types: 50 Standard drinks or equivalent per week    Comment: Drinks 6 beers, liquor 4-6oz (both daily) x 35 years  . Drug use: Yes    Types: Marijuana    Comment: 09-03-2019  per pt states very seldom    Home Medications Prior to Admission medications   Medication Sig Start Date End Date Taking? Authorizing Provider  ACCU-CHEK GUIDE test strip USE TO TEST BLOOD SUGARTDAILY AS DIRECTED. 07/20/19   [provider]  Accu-Chek Softclix Lancets lancets USE TO TEST BLOOD SUGARADAILY AS DIRECTED. 03/18/19   [provider]  albuterol (PROVENTIL HFA;VENTOLIN HFA) 108 (90  Base) MCG/ACT inhaler Inhale 2 puffs into the lungs every 6 (six) hours as needed for wheezing or shortness of breath. 10/31/18   Hayden Rasmussen, MD  amitriptyline (ELAVIL) 100 MG tablet Take 100 mg by mouth at bedtime.    [provider]  atorvastatin (LIPITOR) 40 MG tablet Take 40 mg by mouth every evening.  12/17/15   [provider]  Blood Glucose Monitoring Suppl (ACCU-CHEK GUIDE) w/Device KIT USE TO TEST BLOOD SUGARAAS DIRECTED BY DOCTOR. 03/18/19   [provider]  docusate sodium (COLACE) 100 MG capsule Take 100 mg by mouth daily as needed for mild constipation.    [provider]  doxycycline (VIBRA-TABS) 100 MG tablet Take 1 tablet (100 mg total) by mouth 2 (two) times daily. 10/18/19   Evelina Bucy, DPM  furosemide (LASIX) 20 MG tablet Take 20 mg by mouth daily.     [provider]  gabapentin (NEURONTIN) 300 MG capsule Take 98m in the morning and at noon, then take 12050mat bedtime Patient taking differently: Take 900-1,200 mg by mouth See admin instructions. Take 90082mn the morning and at noon, then take 1200m50m bedtime 06/14/19   PateNarda AmberDO  glimepiride (AMARYL) 4 MG tablet Take 4 mg by mouth daily. 08/10/19   [provider]  glycopyrrolate (ROBINUL) 1 MG tablet Take 1 mg by  mouth 2 (two) times daily.  12/17/15   [provider]  HYDROcodone-acetaminophen (NORCO) 5-325 MG tablet Take 1 tablet by mouth every 6 (six) hours as needed for moderate pain. 10/18/19   PricEvelina BucyM  hydrOXYzine (ATARAX/VISTARIL) 25 MG tablet Take 25 mg by mouth 4 (four) times daily as needed for anxiety.  06/17/19   [provider]  lidocaine (XYLOCAINE) 5 % ointment APPLY TO AFFECTED AREAS TWICE DAILY AS NEEDED. Patient taking differently: Apply 1 application topically 2 (two) times daily as needed for mild pain.  04/04/19   Patel, DoniArvin CollardDO  lisinopril (PRINIVIL,ZESTRIL) 20 MG tablet Take 20 mg by mouth daily.  12/30/15   [provider]  omeprazole (PRILOSEC) 20 MG capsule Take 1 capsule (20 mg total) by mouth 2 (two) times daily before a meal. 1 PO 30 mins prior to breakfast and supper Patient taking differently: Take 20 mg by mouth 2 (two) times daily before a meal.  03/05/19   LewiMahala Menghini-C  ondansetron (ZOFRAN ODT) 4 MG disintegrating tablet Take 1 tablet (4 mg total) by mouth every 8 (eight) hours as needed for nausea or vomiting. 10/18/19   Rai, RipuVernelle Emerald  primidone (MYSOLINE) 50 MG tablet Take 1 tablet (50 mg total) by mouth 2 (two) times daily. Patient taking differently: Take 50 mg by mouth 2 (two) times daily.  06/14/19   Patel, Donika K, DO  VOLTAREN 1 % GEL Apply 2 g topically 4 (four) times daily.  08/19/15   [provider]    Allergies    Patient has no known allergies.  Review of Systems   Review of Systems  All other systems reviewed and are negative.   Physical Exam Updated Vital Signs BP (!) 173/89 (BP Location: Right Arm)   Pulse 78   Temp 97.6 F (36.4 C) (Oral)   Resp 16   Ht 1.88 m (6' 2" )   Wt 122.7 kg   SpO2 99%   BMI 34.72 kg/m   Physical Exam Vitals and nursing note reviewed.  Constitutional:      General:  He is not in acute distress.    Appearance: Normal appearance. He is well-developed. He  is not toxic-appearing.  HENT:     Head: Normocephalic and atraumatic.  Eyes:     General: Lids are normal.     Conjunctiva/sclera: Conjunctivae normal.     Pupils: Pupils are equal, round, and reactive to light.  Neck:     Thyroid: No thyroid mass.     Trachea: No tracheal deviation.  Cardiovascular:     Rate and Rhythm: Normal rate and regular rhythm.     Heart sounds: Normal heart sounds. No murmur. No gallop.   Pulmonary:     Effort: Pulmonary effort is normal. No respiratory distress.     Breath sounds: Normal breath sounds. No stridor. No decreased breath sounds, wheezing, rhonchi or rales.  Abdominal:     General: Bowel sounds are normal. There is no distension.     Palpations: Abdomen is soft.     Tenderness: There is no abdominal tenderness. There is no rebound.  Musculoskeletal:        General: No tenderness. Normal range of motion.     Cervical back: Normal range of motion and neck supple.       Feet:  Skin:    General: Skin is warm and dry.     Findings: No abrasion or rash.  Neurological:     Mental Status: He is alert and oriented to person, place, and time.     GCS: GCS eye subscore is 4. GCS verbal subscore is 5. GCS motor subscore is 6.     Cranial Nerves: No cranial nerve deficit.     Sensory: No sensory deficit.  Psychiatric:        Speech: Speech normal.        Behavior: Behavior normal.       ED Results / Procedures / Treatments   Labs (all labs ordered are listed, but only abnormal results are displayed) Labs Reviewed  SARS CORONAVIRUS 2 (TAT 6-24 HRS)  CBC WITH DIFFERENTIAL/PLATELET  BASIC METABOLIC PANEL    EKG None  Radiology DG Foot Complete Left  Result Date: 11/08/2019 Please see detailed radiograph report in office note.   Procedures Procedures (including critical care time)  Medications Ordered in ED Medications  0.9 %  sodium chloride infusion (has no administration in time range)  vancomycin (VANCOCIN) IVPB 1000 mg/200 mL  premix (has no administration in time range)    ED Course  I have reviewed the triage vital signs and the nursing notes.  Pertinent labs & imaging results that were available during my care of the patient were reviewed by me and considered in my medical decision making (see chart for details).    MDM Rules/Calculators/A&P                      Patient be started on vancomycin and will consult medicine for admission Final Clinical Impression(s) / ED Diagnoses Final diagnoses:  None    Rx / DC Orders ED Discharge Orders    None       Lacretia Leigh, MD 11/10/19 1321    Lacretia Leigh, MD 11/10/19 1510

## 2019-11-10 NOTE — Progress Notes (Signed)
Lovenox:  Pharmacy for DVT Prophylaxis    Pharmacy has been asked to adjust enoxaparin (lovenox) dosing in this patient for DVT prophylaxis as indicated.    Weight: 122.7kg Height: 74 inches BMI = 34 CrCl > 120 ml/min  Assessment:  As BMI > 30 and normal renal function, will adjust Enoxaparin as recommended to 0.5 mg/kg/q24h for VTE prophylaxis.  Plan: Enoxaparin 60mg  sq q24h  Thank you Leone Haven, PharmD

## 2019-11-11 ENCOUNTER — Inpatient Hospital Stay (HOSPITAL_COMMUNITY): Payer: Medicaid Other | Admitting: Certified Registered Nurse Anesthetist

## 2019-11-11 ENCOUNTER — Inpatient Hospital Stay (HOSPITAL_COMMUNITY): Payer: Medicaid Other

## 2019-11-11 ENCOUNTER — Encounter (HOSPITAL_COMMUNITY)
Admission: EM | Disposition: A | Discharge status: Home Health | Payer: Self-pay | Source: Home / Self Care | Attending: Internal Medicine

## 2019-11-11 ENCOUNTER — Encounter (HOSPITAL_COMMUNITY): Payer: Self-pay | Admitting: Internal Medicine

## 2019-11-11 DIAGNOSIS — M86172 Other acute osteomyelitis, left ankle and foot: Secondary | ICD-10-CM

## 2019-11-11 HISTORY — PX: IRRIGATION AND DEBRIDEMENT FOOT: SHX6602

## 2019-11-11 LAB — FUNGAL ORGANISM REFLEX

## 2019-11-11 LAB — FUNGUS CULTURE RESULT

## 2019-11-11 LAB — CBC
HCT: 28 % — ABNORMAL LOW (ref 39.0–52.0)
Hemoglobin: 8.6 g/dL — ABNORMAL LOW (ref 13.0–17.0)
MCH: 28.2 pg (ref 26.0–34.0)
MCHC: 30.7 g/dL (ref 30.0–36.0)
MCV: 91.8 fL (ref 80.0–100.0)
Platelets: 297 10*3/uL (ref 150–400)
RBC: 3.05 MIL/uL — ABNORMAL LOW (ref 4.22–5.81)
RDW: 13.7 % (ref 11.5–15.5)
WBC: 4.3 10*3/uL (ref 4.0–10.5)
nRBC: 0 % (ref 0.0–0.2)

## 2019-11-11 LAB — BASIC METABOLIC PANEL
Anion gap: 8 (ref 5–15)
BUN: 11 mg/dL (ref 6–20)
CO2: 29 mmol/L (ref 22–32)
Calcium: 8.9 mg/dL (ref 8.9–10.3)
Chloride: 102 mmol/L (ref 98–111)
Creatinine, Ser: 1.18 mg/dL (ref 0.61–1.24)
GFR calc Af Amer: >60 mL/min (ref >60)
GFR calc non Af Amer: >60 mL/min (ref >60)
Glucose, Bld: 186 mg/dL — ABNORMAL HIGH (ref 70–99)
Potassium: 4.7 mmol/L (ref 3.5–5.1)
Sodium: 139 mmol/L (ref 135–145)

## 2019-11-11 LAB — GLUCOSE, CAPILLARY
Glucose-Capillary: 184 mg/dL — ABNORMAL HIGH (ref 70–99)
Glucose-Capillary: 170 mg/dL — ABNORMAL HIGH (ref 70–99)
Glucose-Capillary: 133 mg/dL — ABNORMAL HIGH (ref 70–99)
Glucose-Capillary: 113 mg/dL — ABNORMAL HIGH (ref 70–99)
Glucose-Capillary: 182 mg/dL — ABNORMAL HIGH (ref 70–99)

## 2019-11-11 LAB — SARS CORONAVIRUS 2 (TAT 6-24 HRS): SARS Coronavirus 2: NEGATIVE

## 2019-11-11 LAB — PROTIME-INR
INR: 1.1 (ref 0.8–1.2)
Prothrombin Time: 13.8 seconds (ref 11.4–15.2)

## 2019-11-11 LAB — FUNGUS CULTURE WITH STAIN

## 2019-11-11 LAB — MRSA PCR SCREENING: MRSA by PCR: NEGATIVE

## 2019-11-11 SURGERY — IRRIGATION AND DEBRIDEMENT FOOT
Anesthesia: Monitor Anesthesia Care | Site: Foot | Laterality: Left

## 2019-11-11 MED ORDER — PHENYLEPHRINE 40 MCG/ML (10ML) SYRINGE FOR IV PUSH (FOR BLOOD PRESSURE SUPPORT)
PREFILLED_SYRINGE | INTRAVENOUS | Status: AC
  Filled 2019-11-11: qty 10

## 2019-11-11 MED ORDER — PROPOFOL 500 MG/50ML IV EMUL
INTRAVENOUS | Status: DC | PRN
  Administered 2019-11-11: 75 ug/kg/min via INTRAVENOUS

## 2019-11-11 MED ORDER — VANCOMYCIN HCL 1000 MG IV SOLR
INTRAVENOUS | Status: AC
  Filled 2019-11-11: qty 1000

## 2019-11-11 MED ORDER — SODIUM CHLORIDE 0.9 % IR SOLN
Status: DC | PRN
  Administered 2019-11-11: 3000 mL

## 2019-11-11 MED ORDER — LACTATED RINGERS IV SOLN: INTRAVENOUS | Status: DC

## 2019-11-11 MED ORDER — MIDAZOLAM HCL 5 MG/5ML IJ SOLN
INTRAMUSCULAR | Status: DC | PRN
  Administered 2019-11-11: 2 mg via INTRAVENOUS

## 2019-11-11 MED ORDER — MIDAZOLAM HCL 2 MG/2ML IJ SOLN
INTRAMUSCULAR | Status: AC
  Filled 2019-11-11: qty 2

## 2019-11-11 MED ORDER — BUPIVACAINE HCL (PF) 0.5 % IJ SOLN
INTRAMUSCULAR | Status: AC
  Filled 2019-11-11: qty 30

## 2019-11-11 MED ORDER — INSULIN ASPART 100 UNIT/ML ~~LOC~~ SOLN
0.0000 [IU] | SUBCUTANEOUS | Status: DC
  Administered 2019-11-11 – 2019-11-12 (×4): 2 [IU] via SUBCUTANEOUS
  Administered 2019-11-12: 1 [IU] via SUBCUTANEOUS
  Administered 2019-11-12: 3 [IU] via SUBCUTANEOUS
  Administered 2019-11-13 (×2): 1 [IU] via SUBCUTANEOUS
  Administered 2019-11-13: 2 [IU] via SUBCUTANEOUS
  Administered 2019-11-14: 3 [IU] via SUBCUTANEOUS
  Administered 2019-11-14: 1 [IU] via SUBCUTANEOUS
  Administered 2019-11-14: 3 [IU] via SUBCUTANEOUS
  Administered 2019-11-15: 2 [IU] via SUBCUTANEOUS
  Administered 2019-11-15: 1 [IU] via SUBCUTANEOUS
  Administered 2019-11-15: 2 [IU] via SUBCUTANEOUS
  Administered 2019-11-15: 1 [IU] via SUBCUTANEOUS

## 2019-11-11 MED ORDER — EPHEDRINE 5 MG/ML INJ
INTRAVENOUS | Status: AC
  Filled 2019-11-11: qty 10

## 2019-11-11 MED ORDER — BUPIVACAINE HCL (PF) 0.5 % IJ SOLN
INTRAMUSCULAR | Status: DC | PRN
  Administered 2019-11-11: 10 mL

## 2019-11-11 MED ORDER — 0.9 % SODIUM CHLORIDE (POUR BTL) OPTIME
TOPICAL | Status: DC | PRN
  Administered 2019-11-11: 1000 mL

## 2019-11-11 MED ORDER — FENTANYL CITRATE (PF) 100 MCG/2ML IJ SOLN
INTRAMUSCULAR | Status: AC
  Filled 2019-11-11: qty 2

## 2019-11-11 MED ORDER — VANCOMYCIN HCL 1000 MG IV SOLR
INTRAVENOUS | Status: DC | PRN
  Administered 2019-11-11: 1000 mg

## 2019-11-11 MED ORDER — FENTANYL CITRATE (PF) 100 MCG/2ML IJ SOLN
INTRAMUSCULAR | Status: DC | PRN
  Administered 2019-11-11: 100 ug via INTRAVENOUS

## 2019-11-11 SURGICAL SUPPLY — 73 items
APL PRP STRL LF DISP 70% ISPRP (MISCELLANEOUS) ×1
BLADE HEX COATED 2.75 (ELECTRODE) ×2 IMPLANT
BLADE SURG 15 STRL LF DISP TIS (BLADE) ×1 IMPLANT
BLADE SURG 15 STRL SS (BLADE) ×2
BNDG CMPR 9X4 STRL LF SNTH (GAUZE/BANDAGES/DRESSINGS) ×1
BNDG ELASTIC 3X5.8 VLCR STR LF (GAUZE/BANDAGES/DRESSINGS) ×2 IMPLANT
BNDG ELASTIC 4X5.8 VLCR STR LF (GAUZE/BANDAGES/DRESSINGS) ×2 IMPLANT
BNDG ELASTIC 6X5.8 VLCR STR LF (GAUZE/BANDAGES/DRESSINGS) ×2 IMPLANT
BNDG ESMARK 4X9 LF (GAUZE/BANDAGES/DRESSINGS) ×2 IMPLANT
BNDG GAUZE ELAST 4 BULKY (GAUZE/BANDAGES/DRESSINGS) ×2 IMPLANT
CHLORAPREP W/TINT 26 (MISCELLANEOUS) ×2 IMPLANT
CLEANER TIP ELECTROSURG 2X2 (MISCELLANEOUS) ×2 IMPLANT
CONT SPEC 4OZ CLIKSEAL STRL BL (MISCELLANEOUS) ×2 IMPLANT
COVER BACK TABLE 60X90IN (DRAPES) ×2 IMPLANT
COVER MAYO STAND STRL (DRAPES) ×2 IMPLANT
COVER SURGICAL LIGHT HANDLE (MISCELLANEOUS) ×2 IMPLANT
COVER WAND RF STERILE (DRAPES) IMPLANT
CUFF TOURN SGL QUICK 18X4 (TOURNIQUET CUFF) IMPLANT
CUFF TOURN SGL QUICK 24 (TOURNIQUET CUFF) ×2
CUFF TRNQT CYL 24X4X16.5-23 (TOURNIQUET CUFF) ×1 IMPLANT
DECANTER SPIKE VIAL GLASS SM (MISCELLANEOUS) ×2 IMPLANT
DRAPE EXTREMITY T 121X128X90 (DISPOSABLE) ×2 IMPLANT
DRAPE IMP U-DRAPE 54X76 (DRAPES) ×2 IMPLANT
DRAPE U-SHAPE 47X51 STRL (DRAPES) ×2 IMPLANT
DRSG EMULSION OIL 3X3 NADH (GAUZE/BANDAGES/DRESSINGS) ×2 IMPLANT
DRSG PAD ABDOMINAL 8X10 ST (GAUZE/BANDAGES/DRESSINGS) ×1 IMPLANT
ELECT REM PT RETURN 15FT ADLT (MISCELLANEOUS) ×2 IMPLANT
GAUZE 4X4 16PLY RFD (DISPOSABLE) IMPLANT
GAUZE SPONGE 4X4 12PLY STRL (GAUZE/BANDAGES/DRESSINGS) ×6 IMPLANT
GAUZE XEROFORM 5X9 LF (GAUZE/BANDAGES/DRESSINGS) ×1 IMPLANT
GLOVE BIO SURGEON STRL SZ7.5 (GLOVE) ×2 IMPLANT
GLOVE BIOGEL PI IND STRL 8 (GLOVE) ×1 IMPLANT
GLOVE BIOGEL PI INDICATOR 8 (GLOVE) ×1
GOWN STRL REUS W/ TWL LRG LVL3 (GOWN DISPOSABLE) ×1 IMPLANT
GOWN STRL REUS W/ TWL XL LVL3 (GOWN DISPOSABLE) ×1 IMPLANT
GOWN STRL REUS W/TWL LRG LVL3 (GOWN DISPOSABLE) ×2
GOWN STRL REUS W/TWL XL LVL3 (GOWN DISPOSABLE) ×4 IMPLANT
HEMOSTAT SURGICEL 2X14 (HEMOSTASIS) ×1 IMPLANT
KIT BASIN OR (CUSTOM PROCEDURE TRAY) ×2 IMPLANT
KIT TURNOVER KIT A (KITS) IMPLANT
MANIFOLD NEPTUNE II (INSTRUMENTS) ×2 IMPLANT
MARKER SKIN DUAL TIP RULER LAB (MISCELLANEOUS) ×2 IMPLANT
NDL HYPO 25X1 1.5 SAFETY (NEEDLE) ×1 IMPLANT
NEEDLE HYPO 25X1 1.5 SAFETY (NEEDLE) ×2 IMPLANT
NS IRRIG 1000ML POUR BTL (IV SOLUTION) IMPLANT
PACK ORTHO EXTREMITY (CUSTOM PROCEDURE TRAY) ×2 IMPLANT
PADDING CAST ABS 4INX4YD NS (CAST SUPPLIES) ×1
PADDING CAST ABS COTTON 4X4 ST (CAST SUPPLIES) ×1 IMPLANT
PADDING UNDERCAST 2 STRL (CAST SUPPLIES) ×1
PADDING UNDERCAST 2X4 STRL (CAST SUPPLIES) ×1 IMPLANT
PENCIL SMOKE EVACUATOR (MISCELLANEOUS) IMPLANT
SET INTERPULSE LAVAGE W/TIP (ORTHOPEDIC DISPOSABLE SUPPLIES) ×1 IMPLANT
SET IRRIG Y TYPE TUR BLADDER L (SET/KITS/TRAYS/PACK) IMPLANT
SLEEVE SCD COMPRESS KNEE MED (MISCELLANEOUS) ×2 IMPLANT
STOCKINETTE 8 INCH (MISCELLANEOUS) ×4 IMPLANT
SUT ETHILON 2 0 PS N (SUTURE) ×3 IMPLANT
SUT ETHILON 4 0 PS 2 18 (SUTURE) IMPLANT
SUT MNCRL AB 3-0 PS2 18 (SUTURE) IMPLANT
SUT MNCRL AB 4-0 PS2 18 (SUTURE) IMPLANT
SUT MON AB 5-0 PS2 18 (SUTURE) IMPLANT
SUT PROLENE 4 0 PS 2 18 (SUTURE) ×2 IMPLANT
SUT VIC AB 1 CT1 27 (SUTURE) ×2
SUT VIC AB 1 CT1 27XBRD ANTBC (SUTURE) ×1 IMPLANT
SUT VIC AB 3-0 FS2 27 (SUTURE) ×2 IMPLANT
SUT VICRYL 4-0 PS2 18IN ABS (SUTURE) ×2 IMPLANT
SYR 20ML LL LF (SYRINGE) ×2 IMPLANT
SYR BULB 3OZ (MISCELLANEOUS) ×2 IMPLANT
SYR CONTROL 10ML LL (SYRINGE) ×2 IMPLANT
TOWEL OR 17X26 10 PK STRL BLUE (TOWEL DISPOSABLE) ×2 IMPLANT
TOWEL OR NON WOVEN STRL DISP B (DISPOSABLE) ×2 IMPLANT
TRAY PREP A LATEX SAFE STRL (SET/KITS/TRAYS/PACK) ×2 IMPLANT
UNDERPAD 30X36 HEAVY ABSORB (UNDERPADS AND DIAPERS) ×8 IMPLANT
YANKAUER SUCT BULB TIP NO VENT (SUCTIONS) ×2 IMPLANT

## 2019-11-11 NOTE — Anesthesia Postprocedure Evaluation (Signed)
Anesthesia Post Note  Patient: Eugene Diaz  Procedure(s) Performed: Left Foot Wound Irrigation and Debridement (Left Foot)     Patient location during evaluation: PACU Anesthesia Type: MAC Level of consciousness: awake and alert and oriented Pain management: pain level controlled Vital Signs Assessment: post-procedure vital signs reviewed and stable Respiratory status: nonlabored ventilation, respiratory function stable and spontaneous breathing Cardiovascular status: stable and blood pressure returned to baseline Postop Assessment: no apparent nausea or vomiting Anesthetic complications: no    Last Vitals:  Vitals:   11/11/19 1807 11/11/19 1815  BP: (!) 151/75 94/71  Pulse: 71 66  Resp: 12 16  Temp: 36.6 C   SpO2: 100% 100%    Last Pain:  Vitals:   11/11/19 1807  TempSrc:   PainSc: 0-No pain                 Paula Zietz A.

## 2019-11-11 NOTE — Progress Notes (Signed)
[facility] Progress Note  Patient: Eugene Diaz [id number]   PCP: [name], PA-C DOB: 10-26-1966   DOA: 11/10/2019   DOS: 11/11/2019   Date of Service: the patient was seen and examined on 11/11/2019  Chief Complaint  Patient presents with  . Cellulitis   Brief hospital course: Eugene Diaz is a 54 y.o. male with medical history significant of DM-2 with severe peripheral neuropathy, left diabetic foot infection in the past s/p left foot debridement with left partial second ray resection with antibiotic bead placement on 10/06/19, HTN, alcohol dependent and Marijuana abuse, anemia of chronic diease, presented with a suspected left foot wound infection. Since his last food debridement, he was followed up by podiatrist, initially on 11/04/19, stating he was doing well and ambulating in a cam boot. However, by then his foot already had a bad smell and the staples were partially off suspecting wound dehiscence. So the podiatrist removed the distal sutures and staples as they were not holding tension. Due to the nature of the dehiscence of the distal amputation site with deep probing very close to the bone, patient is a high risk for limb loss/further foot amputation, but pt was lack of clinical signs of infection, Dr. Posey Diaz referred patient to be further evaluated by Dr. March Diaz on 11/08/19. Dr. March Diaz diagnosed patient with left foot wet gangrene and leg cellulitis, and instructed patient to go to hospital for admission with iv antibiotics and amputation surgery/debridement (transmetatarsal vs Lisfranc amputation). However, pt misunderstood the instruction and went home instead. Today he thought the hospital can admit him and decided to come. Pt feels tired, but denies pain from the feet as he barely felt the feet. No fever or chills, no sob or chest pain. No dizziness. Appetite fair. Last BM 4 days ago but no abdominal pain or fullness. No dysuria.   Currently further plan is continue IV  antibiotics and monitor postop recovery.  Subjective: Has mild pain.  No nausea no vomiting no fever no chills.  Has some redness involving the left leg which currently is improving.  Assessment and Plan: Scheduled Meds: . [MAR Hold] amitriptyline  100 mg Oral QHS  . [MAR Hold] atorvastatin  40 mg Oral QPM  . [MAR Hold] docusate sodium  100 mg Oral BID  . [MAR Hold] enoxaparin (LOVENOX) injection  60 mg Subcutaneous Q24H  . [MAR Hold] folic acid  1 mg Oral Daily  . [MAR Hold] gabapentin  1,200 mg Oral QHS  . [MAR Hold] gabapentin  900 mg Oral BID WC  . [MAR Hold] glycopyrrolate  1 mg Oral BID  . [MAR Hold] insulin aspart  0-9 Units Subcutaneous Q4H  . [MAR Hold] lactobacillus acidophilus & bulgar  1 tablet Oral TID WC  . [MAR Hold] lisinopril  20 mg Oral Daily  . [MAR Hold] multivitamin with minerals  1 tablet Oral Daily  . [MAR Hold] pantoprazole  40 mg Oral Daily  . [MAR Hold] primidone  50 mg Oral BID  . [MAR Hold] thiamine  100 mg Oral Daily   Or  . [MAR Hold] thiamine  100 mg Intravenous Daily   Continuous Infusions: . sodium chloride 20 mL/hr at 11/10/19 2009  . lactated ringers 75 mL/hr at 11/11/19 1623  . [MAR Hold] piperacillin-tazobactam (ZOSYN)  IV 3.375 g (11/11/19 0846)   PRN Meds: [id number] Hold] acetaminophen **OR** [MAR Hold] acetaminophen, [MAR Hold] albuterol, [MAR Hold] docusate sodium, [MAR Hold] hydrOXYzine, [MAR Hold] LORazepam **OR** [MAR Hold] LORazepam, [MAR  Hold] magnesium hydroxide, [MAR Hold]  morphine injection, [MAR Hold] ondansetron **OR** [MAR Hold] ondansetron (ZOFRAN) IV, [MAR Hold] oxycodone  1.  Left foot osteomyelitis. 2 OR today for left foot debridement and irrigation. Continue with IV antibiotics. Recent admission in September for the same. Lower extremity ABIs were unremarkable back then.  Anemia of chronic disease. Monitor H&H.  Currently stable.  Hypertension -BP currently stable, continue lisinopril, home dose of Lasix -Creatinine  stable  Diabetes mellitus type 2, uncontrolled with osteomyelitis and foot ulcer -Hemoglobin A1c 5.3 CBGs stable  Chronicneuropathic pain -Continue Neurontin  Hyperlipidemia Continue Lipitor  Obesity Body mass index is 34.72 kg/m.  Patient will be in from outpatient dietary consultation.  Diet: Cardiac diet and Carb modified diet  DVT Prophylaxis: Subcutaneous Lovenox   Advance goals of care discussion: Full code  Family Communication: no family was present at bedside, at the time of interview.   Disposition:  Discharge to be determined.  Consultants: Podiatry Procedures: none  Antibiotics: Anti-infectives (From admission, onward)   Start     Dose/Rate Route Frequency Ordered Stop   11/11/19 0400  vancomycin (VANCOREADY) IVPB 1250 mg/250 mL  Status:  Discontinued     1,250 mg 166.7 mL/hr over 90 Minutes Intravenous Every 12 hours 11/10/19 1621 11/11/19 0811   11/10/19 1615  vancomycin (VANCOREADY) IVPB 1500 mg/300 mL     1,500 mg 150 mL/hr over 120 Minutes Intravenous STAT 11/10/19 1607 11/10/19 1850   11/10/19 1600  piperacillin-tazobactam (ZOSYN) IVPB 3.375 g  Status:  Discontinued     3.375 g 100 mL/hr over 30 Minutes Intravenous Every 8 hours 11/10/19 1546 11/10/19 1554   11/10/19 1600  [MAR Hold]  piperacillin-tazobactam (ZOSYN) IVPB 3.375 g     (MAR Hold since Mon 11/11/2019 at 1603.Hold Reason: Transfer to a Procedural area.)   3.375 g 12.5 mL/hr over 240 Minutes Intravenous Every 8 hours 11/10/19 1554     11/10/19 1330  vancomycin (VANCOCIN) IVPB 1000 mg/200 mL premix     1,000 mg 200 mL/hr over 60 Minutes Intravenous  Once 11/10/19 1319 11/10/19 1452       Objective: Physical Exam: Vitals:   11/10/19 2148 11/11/19 0544 11/11/19 1326 11/11/19 1629  BP: (!) 144/71 (!) 160/83 (!) 147/78 (!) 157/81  Pulse: 74 70 84 67  Resp: 16 16    Temp: (!) 97.5 F (36.4 C) 97.6 F (36.4 C) 99 F (37.2 C) 98.6 F (37 C)  TempSrc: Oral Oral Oral Oral  SpO2:  99% 97% 98% 96%  Weight:      Height:        Intake/Output Summary (Last 24 hours) at 11/11/2019 1735 Last data filed at 11/11/2019 1358 Gross per 24 hour  Intake 653.14 ml  Output 850 ml  Net -196.86 ml   Filed Weights   11/10/19 1156  Weight: 122.7 kg   General: alert and oriented to time, place, and person. Appear in mild distress, affect appropriate Eyes: PERRL, Conjunctiva normal ENT: Oral Mucosa Clear, moist  Neck: no JVD, no Abnormal Mass Or lumps Cardiovascular: S1 and S2 Present, no Murmur,  Respiratory: good respiratory effort, Bilateral Air entry equal and Decreased, no signs of accessory muscle use, Clear to Auscultation, no Crackles, no wheezes Abdomen: Bowel Sound present, Soft and no tenderness, no hernia Skin: no rashes  Extremities: no Pedal edema, no calf tenderness Neurologic: without any new focal findings  Gait not checked due to patient safety concerns      Data Reviewed: I have  personally reviewed and interpreted daily labs, tele strips, imagings as discussed above. I reviewed all nursing notes, pharmacy notes, vitals, pertinent old records I have discussed plan of care as described above with RN and patient/family.  CBC: Recent Labs  Lab 11/10/19 1350 11/10/19 1655 11/11/19 0326  WBC 6.4 5.6 4.3  NEUTROABS 4.5  --   --   HGB 9.0* 8.7* 8.6*  HCT 28.6* 27.5* 28.0*  MCV 91.1 93.2 91.8  PLT 309 267 [id number]   Basic Metabolic Panel: Recent Labs  Lab 11/10/19 1350 11/10/19 1655 11/11/19 0326  NA 134* 137 139  K 4.2 4.2 4.7  CL 98 99 102  CO2 26 28 29   GLUCOSE 244* 178* 186*  BUN 13 12 11   CREATININE 0.98 0.93 1.18  CALCIUM 8.9 8.7* 8.9  MG  --  1.8  --   PHOS  --  3.6  --     Liver Function Tests: Recent Labs  Lab 11/10/19 1655  AST 15  ALT 11  ALKPHOS 95  BILITOT 0.5  PROT 7.7  ALBUMIN 2.8*   No results for input(s): LIPASE, AMYLASE in the last 168 hours. No results for input(s): AMMONIA in the last 168 hours. Coagulation  Profile: Recent Labs  Lab 11/11/19 0326  INR 1.1   Cardiac Enzymes: No results for input(s): CKTOTAL, CKMB, CKMBINDEX, TROPONINI in the last 168 hours. BNP (last 3 results) No results for input(s): PROBNP in the last 8760 hours. CBG: Recent Labs  Lab 11/10/19 1859 11/10/19 2153 11/11/19 0820 11/11/19 1217 11/11/19 1618  GLUCAP 143* 193* 184* 170* 133*   Studies: No results found.   Time spent: 35 minutes  Author: Berle Mull, MD [facility] 11/11/2019 5:35 PM  To reach On-call, see care teams to locate the attending and reach out to them via [email or web address]. If 7PM-7AM, please contact night-coverage If you still have difficulty reaching the attending provider, please page the [facility] (Director on Call) for [facility] on amion for assistance.

## 2019-11-11 NOTE — Consult Note (Signed)
[facility] Nurse Consult Note: Reason for Consult:LEft foot osteomyelitis.  POdiatry has consulted and surgery is scheduled for today.  Will defer to podiatry service.  No WOC needs at this time.  Wound type:Infectious neuropathic ulcer to left foot. Osteomyelitis present.  Pressure Injury POA: NA Will not follow at this time.  Please re-consult if needed.  Domenic Moras MSN, RN, FNP-BC CWON Wound, Ostomy, Continence Nurse Pager [phone]

## 2019-11-11 NOTE — Plan of Care (Signed)
  Problem: Education: Goal: Knowledge of General Education information will improve Description: Including pain rating scale, medication(s)/side effects and non-pharmacologic comfort measures Outcome: Progressing   Problem: Health Behavior/Discharge Planning: Goal: Ability to manage health-related needs will improve Outcome: Progressing   Problem: Clinical Measurements: Goal: Will remain free from infection Outcome: Progressing   Problem: Clinical Measurements: Goal: Respiratory complications will improve Outcome: Progressing   Problem: Clinical Measurements: Goal: Respiratory complications will improve Outcome: Progressing   Problem: Clinical Measurements: Goal: Respiratory complications will improve Outcome: Progressing   Problem: Clinical Measurements: Goal: Cardiovascular complication will be avoided Outcome: Progressing   Problem: Activity: Goal: Risk for activity intolerance will decrease Outcome: Progressing   Problem: Nutrition: Goal: Adequate nutrition will be maintained Outcome: Progressing   Problem: Coping: Goal: Level of anxiety will decrease Outcome: Progressing   Problem: Elimination: Goal: Will not experience complications related to bowel motility Outcome: Progressing   Problem: Pain Managment: Goal: General experience of comfort will improve Outcome: Progressing   Problem: Safety: Goal: Ability to remain free from injury will improve Outcome: Progressing   Problem: Skin Integrity: Goal: Risk for impaired skin integrity will decrease Outcome: Progressing

## 2019-11-11 NOTE — H&P (Signed)
Anesthesia H&P Update: History and Physical Exam reviewed; patient is OK for planned anesthetic and procedure. ? ?

## 2019-11-11 NOTE — Plan of Care (Signed)
Will see patient later today. Plan for surgery this early evening. Continue NPO. Orders in place for surgery.  Contact with questions or concerns.  Evelina Bucy, DPM

## 2019-11-11 NOTE — Anesthesia Preprocedure Evaluation (Signed)
Anesthesia Evaluation  Patient identified by MRN, date of birth, ID band Patient awake    Reviewed: Allergy & Precautions, NPO status , Patient's Chart, lab work & pertinent test results  Airway Mallampati: II  TM Distance: >3 FB Neck ROM: Full    Dental  (+) Poor Dentition, Dental Advisory Given,  Extremely poor dentition:   Pulmonary asthma , COPD, former smoker,  Quit smoking 1985- 62.5 pack year history  Uses rescue inhaler daily- uses 3-4x/d   Pulmonary exam normal breath sounds clear to auscultation       Cardiovascular hypertension, Pt. on medications + Peripheral Vascular Disease and +CHF  Normal cardiovascular exam Rhythm:Regular Rate:Normal  ECG: SR, rate 77  Last echo 2015: Mild LVH with LVEF 55-60%, grade 2  diastolic dysfunction. Mild MAC with trivial mtiral  regurgitation. Mildly sclerotic aortic valve. Mildly  reduced RV contraction. Unable to assess PASP. No  pericardial effusion.    Neuro/Psych PSYCHIATRIC DISORDERS Anxiety Depression Essential tremor Alcohol related peripheral neuropathy  Neuromuscular disease    GI/Hepatic PUD, GERD  Medicated,(+)     substance abuse  alcohol use,   Endo/Other  negative endocrine ROSdiabetes  Renal/GU negative Renal ROS  negative genitourinary   Musculoskeletal  (+) Arthritis , narcotic dependentOsteomyelitis of left foot. Dehiscence of wound   Abdominal (+) + obese,   Peds  Hematology  (+) anemia , HLD   Anesthesia Other Findings   Reproductive/Obstetrics negative OB ROS                             Anesthesia Physical  Anesthesia Plan  ASA: III  Anesthesia Plan: MAC   Post-op Pain Management:    Induction:   PONV Risk Score and Plan: 1 and Ondansetron, Treatment may vary due to age or medical condition, Propofol infusion and TIVA  Airway Management Planned: Natural Airway and Simple Face Mask  Additional  Equipment: None  Intra-op Plan:   Post-operative Plan:   Informed Consent: I have reviewed the patients History and Physical, chart, labs and discussed the procedure including the risks, benefits and alternatives for the proposed anesthesia with the patient or authorized representative who has indicated his/her understanding and acceptance.     Dental advisory given  Plan Discussed with: CRNA and Surgeon  Anesthesia Plan Comments:         Anesthesia Quick Evaluation

## 2019-11-11 NOTE — Op Note (Addendum)
Patient Name: Eugene Diaz DOB: 11-25-1965  MRN: [id number]   Date of Service: 11/11/2019  Surgeon: Dr. Hardie Pulley, DPM Assistants: None Pre-operative Diagnosis:  Diabetic ulcer with necrosis of bone, osteomyelitis Post-operative Diagnosis:  Diabetic ulcer with necrosis of bone, osteomyelitis Procedures:  1) Amputation left third toe  2) Amputation left 3rd metatarsal Pathology/Specimens: ID Type Source Tests Collected by Time Destination  1 : left foot, 3rd toe and 3rd metatarsal Tissue Soft Tissue, Other SURGICAL PATHOLOGY Evelina Bucy, DPM 11/11/2019 1744   A : left foot culture Tissue PATH Soft tissue AEROBIC/ANAEROBIC CULTURE (SURGICAL/DEEP WOUND) Evelina Bucy, DPM 11/11/2019 1800   B : left foot culture Tissue PATH Soft tissue AEROBIC/ANAEROBIC CULTURE (SURGICAL/DEEP WOUND) Evelina Bucy, DPM 11/11/2019 1802    Anesthesia: MAC/local with 10 ccs 0.5 % marcaine plain Hemostasis: * Missing tourniquet times found for documented tourniquets in log: [id number] * Estimated Blood Loss: 10 ml Materials: * No implants in log * Medications: 1g Vancomycin Complications: none  Indications for Procedure:  This is a 54 y.o. male with a chronic left foot wound that has underwent surgical intervention multiple times.  At last check in the office the wound was necrotic with probe to bone and it was discussed that he would benefit from operative intervention.  Discussed would benefit from removal of all nonviable tissue up to including midfoot amputation if indicated all risk benefits alternatives surgery were discussed.  This procedure was more extensive than the previous procedures that the patient had in an attempt to control the infection.   Procedure in Detail: Patient was identified in pre-operative holding area. Formal consent was signed and the left lower extremity was marked. Patient was brought back to the operating room. Anesthesia was induced. The extremity was prepped and  draped in the usual sterile fashion. Timeout was taken to confirm patient name, laterality, and procedure prior to incision.   Attention was then directed to the left foot.  There was a large plantar wound that probed to the third metatarsal.  The wound was sharply excisionally debrided of all nonviable soft tissue.  The third metatarsal and toe were identified and found to be nonviable.  The third toe was testicular the metatarsal phalangeal joint.  The wound was then thoroughly copiously irrigated with pulse lavage for a total of approximately 2.5 L.  The third metatarsal was then transected with sagittal saw.  The wound was then thoroughly further debrided with 15 blade and rongeur.  The wound was then further irrigated with the remaining half liter of normal saline.  The wound was then loosely approximated with 2-0 nylon. Surgicel was packed into the wound. The foot was then dressed with Xeroform, Betadine soaked gauze 4 x 4 Kerlix Ace bandage. Patient tolerated the procedure well.   Disposition: Following a period of post-operative monitoring, patient will be transferred back to the floor.  He will benefit from return to the operating room later this week for definitive procedure likely transmetatarsal versus Lisfranc amputation.

## 2019-11-11 NOTE — Progress Notes (Signed)
  Subjective:  Patient ID: Eugene Diaz, male    DOB: 1966/09/17,  MRN: [id number]  Patient seen in pre-op. Understands plan for the OR today. Objective:   Vitals:   11/11/19 1326 11/11/19 1629  BP: (!) 147/78 (!) 157/81  Pulse: 84 67  Resp:    Temp: 99 F (37.2 C) 98.6 F (37 C)  SpO2: 98% 96%   Dressing intact left foot, decreasing leg edema, erythema. No strikethrough on dressing.  Assessment & Plan:  Patient was evaluated and treated and all questions answered.  Left Foot Infection -To OR today for Left foot Debridement and irrigation with removal of all non-viable soft tissue and bone, up to and including midfoot amputation if indicated. -Consent reviewed and signed -LLE marked. -Will follow post-op and provided updated reccs.  Evelina Bucy, DPM  Accessible via secure chat for questions or concerns.

## 2019-11-11 NOTE — Transfer of Care (Signed)
Immediate Anesthesia Transfer of Care Note  Patient: Eugene Diaz  Procedure(s) Performed: Left Foot Wound Debridement (Left Foot) AMPUTATION MIDFOOT (Left Foot)  Patient Location: PACU  Anesthesia Type:MAC  Level of Consciousness: awake, alert  and oriented  Airway & Oxygen Therapy: Patient Spontanous Breathing and Patient connected to face mask oxygen  Post-op Assessment: Report given to RN and Post -op Vital signs reviewed and stable  Post vital signs: Reviewed and stable  Last Vitals:  Vitals Value Taken Time  BP    Temp    Pulse 71 11/11/19 1809  Resp 15 11/11/19 1809  SpO2 100 % 11/11/19 1809  Vitals shown include unvalidated device data.  Last Pain:  Vitals:   11/11/19 1629  TempSrc: Oral  PainSc:       Patients Stated Pain Goal: 2 (44/01/02 7253)  Complications: No apparent anesthesia complications

## 2019-11-12 LAB — CBC WITH DIFFERENTIAL/PLATELET
Abs Immature Granulocytes: 0.05 10*3/uL (ref 0.00–0.07)
Basophils Absolute: 0 10*3/uL (ref 0.0–0.1)
Basophils Relative: 1 %
Eosinophils Absolute: 0.2 10*3/uL (ref 0.0–0.5)
Eosinophils Relative: 4 %
HCT: 26.2 % — ABNORMAL LOW (ref 39.0–52.0)
Hemoglobin: 8.2 g/dL — ABNORMAL LOW (ref 13.0–17.0)
Immature Granulocytes: 1 %
Lymphocytes Relative: 27 %
Lymphs Abs: 1.6 10*3/uL (ref 0.7–4.0)
MCH: 29 pg (ref 26.0–34.0)
MCHC: 31.3 g/dL (ref 30.0–36.0)
MCV: 92.6 fL (ref 80.0–100.0)
Monocytes Absolute: 0.3 10*3/uL (ref 0.1–1.0)
Monocytes Relative: 6 %
Neutro Abs: 3.8 10*3/uL (ref 1.7–7.7)
Neutrophils Relative %: 61 %
Platelets: 276 10*3/uL (ref 150–400)
RBC: 2.83 MIL/uL — ABNORMAL LOW (ref 4.22–5.81)
RDW: 13.8 % (ref 11.5–15.5)
WBC: 6 10*3/uL (ref 4.0–10.5)
nRBC: 0 % (ref 0.0–0.2)

## 2019-11-12 LAB — COMPREHENSIVE METABOLIC PANEL
ALT: 9 U/L (ref 0–44)
AST: 13 U/L — ABNORMAL LOW (ref 15–41)
Albumin: 2.7 g/dL — ABNORMAL LOW (ref 3.5–5.0)
Alkaline Phosphatase: 77 U/L (ref 38–126)
Anion gap: 8 (ref 5–15)
BUN: 10 mg/dL (ref 6–20)
CO2: 26 mmol/L (ref 22–32)
Calcium: 8.4 mg/dL — ABNORMAL LOW (ref 8.9–10.3)
Chloride: 101 mmol/L (ref 98–111)
Creatinine, Ser: 0.96 mg/dL (ref 0.61–1.24)
GFR calc Af Amer: >60 mL/min (ref >60)
GFR calc non Af Amer: >60 mL/min (ref >60)
Glucose, Bld: 126 mg/dL — ABNORMAL HIGH (ref 70–99)
Potassium: 3.8 mmol/L (ref 3.5–5.1)
Sodium: 135 mmol/L (ref 135–145)
Total Bilirubin: 0.6 mg/dL (ref 0.3–1.2)
Total Protein: 7.1 g/dL (ref 6.5–8.1)

## 2019-11-12 LAB — GLUCOSE, CAPILLARY
Glucose-Capillary: 119 mg/dL — ABNORMAL HIGH (ref 70–99)
Glucose-Capillary: 127 mg/dL — ABNORMAL HIGH (ref 70–99)
Glucose-Capillary: 160 mg/dL — ABNORMAL HIGH (ref 70–99)
Glucose-Capillary: 169 mg/dL — ABNORMAL HIGH (ref 70–99)
Glucose-Capillary: 200 mg/dL — ABNORMAL HIGH (ref 70–99)
Glucose-Capillary: 208 mg/dL — ABNORMAL HIGH (ref 70–99)

## 2019-11-12 LAB — MAGNESIUM: Magnesium: 1.8 mg/dL (ref 1.7–2.4)

## 2019-11-12 LAB — PROTIME-INR
INR: 1 (ref 0.8–1.2)
Prothrombin Time: 13.5 seconds (ref 11.4–15.2)

## 2019-11-12 NOTE — Progress Notes (Signed)
PROGRESS NOTE    Eugene Diaz  [id number] DOB: 03-16-1966 DOA: 11/10/2019 PCP: [name], PA-C   Brief Narrative: Patient is a 54 year old male with history of diabetes type 2 with severe peripheral neuropathy, left diabetic foot infection, status post left foot debridement with left partial second ray resection with antibiotic bead placement on 10/06/2019, hypertension, alcohol dependence, marijuana abuse, anemia of chronic disease who presented with suspected left foot infection.  He was sent by his podiatrist [name] for further evaluation of possible left foot with gangrene/leg cellulitis.  Started on antibiotics.  He underwent amputation of left third toe, amputation of left third metatarsal by [name] on 11/11/2019.  Plan for possible transmetatarsal versus Lisfranc amputation by [name].  Currently hemodynamically stable.  Assessment & Plan:   Principal Problem:   Acute osteomyelitis of toe of left foot (HCC) Active Problems:   Hypertension   ETOH abuse   Diabetic neuropathy (HCC)   Diabetic infection of left foot (HCC)   Osteomyelitis of foot (HCC)   Wound dehiscence   Anemia, chronic disease   Acute osteomyelitis of left foot (HCC)   Marijuana use   Left foot osteomyelitis: Continue Zosyn for now.  Wound culture showing gram-positive cocci in pairs.  Currently hemodynamically stable.  He underwent amputation of left third toe, amputation of left third metatarsal by [name] on 11/11/2019.  Plan for possible transmetatarsal versus Lisfranc amputation by [name].  Diabetes type 2: Controlled.  Hemoglobin A1c 5.3.  Continue current regimen.  He was on oral antihyperglycemics at home.  Diabetic neuropathy: On Neurontin  Anemia of chronic disease: Currently H&H stable.  Continue to monitor  Hypertension: Hypertensive this morning.  Continue current medicines  Hyperlipidemia: Continue Lipitor  Chronic alcohol abuse: Started on thiamine and folic  acid.  Obesity: BMI of 34.         DVT prophylaxis: Lovenox Code Status: Full Family Communication: None present the bedside Disposition Plan: Home after podiatry clearance   Consultants: Podiatry  Procedures: As above  Antimicrobials:  Anti-infectives (From admission, onward)   Start     Dose/Rate Route Frequency Ordered Stop   11/11/19 1749  vancomycin (VANCOCIN) powder  Status:  Discontinued       As needed 11/11/19 1749 11/11/19 1855   11/11/19 0400  vancomycin (VANCOREADY) IVPB 1250 mg/250 mL  Status:  Discontinued     1,250 mg 166.7 mL/hr over 90 Minutes Intravenous Every 12 hours 11/10/19 1621 11/11/19 0811   11/10/19 1615  vancomycin (VANCOREADY) IVPB 1500 mg/300 mL     1,500 mg 150 mL/hr over 120 Minutes Intravenous STAT 11/10/19 1607 11/10/19 1850   11/10/19 1600  piperacillin-tazobactam (ZOSYN) IVPB 3.375 g  Status:  Discontinued     3.375 g 100 mL/hr over 30 Minutes Intravenous Every 8 hours 11/10/19 1546 11/10/19 1554   11/10/19 1600  piperacillin-tazobactam (ZOSYN) IVPB 3.375 g     3.375 g 12.5 mL/hr over 240 Minutes Intravenous Every 8 hours 11/10/19 1554     11/10/19 1330  vancomycin (VANCOCIN) IVPB 1000 mg/200 mL premix     1,000 mg 200 mL/hr over 60 Minutes Intravenous  Once 11/10/19 1319 11/10/19 1452      Subjective:  Patient seen and examined at the bedside this morning.  Hemodynamically stable.  Pain well controlled.  No active issues. Objective: Vitals:   11/11/19 2156 11/12/19 0003 11/12/19 0414 11/12/19 0936  BP: (!) 157/78 (!) 157/79 (!) 161/81 (!) 150/78  Pulse: 77 79 75  87  Resp: 18 16 16 18   Temp: 98.7 F (37.1 C) 98.5 F (36.9 C) 98.4 F (36.9 C) 98.6 F (37 C)  TempSrc: Oral Oral Oral Oral  SpO2: 100% 100% 98% 98%  Weight:      Height:        Intake/Output Summary (Last 24 hours) at 11/12/2019 1115 Last data filed at 11/12/2019 [id number] Gross per 24 hour  Intake 1225.69 ml  Output 1585 ml  Net -359.31 ml   Filed Weights    11/10/19 1156  Weight: 122.7 kg    Examination:  General exam: Appears calm and comfortable ,Not in distress,obese HEENT:PERRL,Oral mucosa moist, Ear/Nose normal on gross exam Respiratory system: Bilateral equal air entry, normal vesicular breath sounds, no wheezes or crackles  Cardiovascular system: S1 & S2 heard, RRR. No JVD, murmurs, rubs, gallops or clicks. No pedal edema. Gastrointestinal system: Abdomen is nondistended, soft and nontender. No organomegaly or masses felt. Normal bowel sounds heard. Central nervous system: Alert and oriented. No focal neurological deficits. Extremities: No edema, no clubbing ,no cyanosis, left foot wrapped with dressing Skin: No rashes, lesions or ulcers,no icterus ,no pallor      Data Reviewed: I have personally reviewed following labs and imaging studies  CBC: Recent Labs  Lab 11/10/19 1350 11/10/19 1655 11/11/19 0326 11/12/19 0338  WBC 6.4 5.6 4.3 6.0  NEUTROABS 4.5  --   --  3.8  HGB 9.0* 8.7* 8.6* 8.2*  HCT 28.6* 27.5* 28.0* 26.2*  MCV 91.1 93.2 91.8 92.6  PLT 309 267 297 [id number]   Basic Metabolic Panel: Recent Labs  Lab 11/10/19 1350 11/10/19 1655 11/11/19 0326 11/12/19 0338  NA 134* 137 139 135  K 4.2 4.2 4.7 3.8  CL 98 99 102 101  CO2 26 28 29 26   GLUCOSE 244* 178* 186* 126*  BUN 13 12 11 10   CREATININE 0.98 0.93 1.18 0.96  CALCIUM 8.9 8.7* 8.9 8.4*  MG  --  1.8  --  1.8  PHOS  --  3.6  --   --    GFR: Estimated Creatinine Clearance: 122.4 mL/min (by C-G formula based on SCr of 0.96 mg/dL). Liver Function Tests: Recent Labs  Lab 11/10/19 1655 11/12/19 0338  AST 15 13*  ALT 11 9  ALKPHOS 95 77  BILITOT 0.5 0.6  PROT 7.7 7.1  ALBUMIN 2.8* 2.7*   No results for input(s): LIPASE, AMYLASE in the last 168 hours. No results for input(s): AMMONIA in the last 168 hours. Coagulation Profile: Recent Labs  Lab 11/11/19 0326 11/12/19 0338  INR 1.1 1.0   Cardiac Enzymes: No results for input(s): CKTOTAL, CKMB,  CKMBINDEX, TROPONINI in the last 168 hours. BNP (last 3 results) No results for input(s): PROBNP in the last 8760 hours. HbA1C: Recent Labs    11/10/19 1655  HGBA1C 6.4*   CBG: Recent Labs  Lab 11/11/19 1821 11/11/19 2004 11/12/19 0005 11/12/19 0415 11/12/19 0751  GLUCAP 113* 182* 169* 119* 127*   Lipid Profile: No results for input(s): CHOL, HDL, LDLCALC, TRIG, CHOLHDL, LDLDIRECT in the last 72 hours. Thyroid Function Tests: No results for input(s): TSH, T4TOTAL, FREET4, T3FREE, THYROIDAB in the last 72 hours. Anemia Panel: No results for input(s): VITAMINB12, FOLATE, FERRITIN, TIBC, IRON, RETICCTPCT in the last 72 hours. Sepsis Labs: No results for input(s): PROCALCITON, LATICACIDVEN in the last 168 hours.  Recent Results (from the past 240 hour(s))  SARS CORONAVIRUS 2 (TAT 6-24 HRS) Nasopharyngeal Nasopharyngeal Swab     Status: None  Collection Time: 11/10/19  1:50 PM   Specimen: Nasopharyngeal Swab  Result Value Ref Range Status   SARS Coronavirus 2 NEGATIVE NEGATIVE Final    Comment: (NOTE) SARS-CoV-2 target nucleic acids are NOT DETECTED. The SARS-CoV-2 RNA is generally detectable in upper and lower respiratory specimens during the acute phase of infection. Negative results do not preclude SARS-CoV-2 infection, do not rule out co-infections with other pathogens, and should not be used as the sole basis for treatment or other patient management decisions. Negative results must be combined with clinical observations, patient history, and epidemiological information. The expected result is Negative. Fact Sheet for Patients: SugarRoll.be Fact Sheet for Healthcare Providers: https://www.woods-mathews.com/ This test is not yet approved or cleared by the [place] FDA and  has been authorized for detection and/or diagnosis of SARS-CoV-2 by FDA under an Emergency Use Authorization (EUA). This EUA will remain  in effect  (meaning this test can be used) for the duration of the COVID-19 declaration under Section 56 4(b)(1) of the Act, 21 U.S.C. section 360bbb-3(b)(1), unless the authorization is terminated or revoked sooner. Performed at [facility], [place] [address], [place], [place] [address]   MRSA PCR Screening     Status: None   Collection Time: 11/11/19  3:03 AM   Specimen: Nasal Mucosa; Nasopharyngeal  Result Value Ref Range Status   MRSA by PCR NEGATIVE NEGATIVE Final    Comment:        The GeneXpert MRSA Assay (FDA approved for NASAL specimens only), is one component of a comprehensive MRSA colonization surveillance program. It is not intended to diagnose MRSA infection nor to guide or monitor treatment for MRSA infections. Performed at [facility], [place] [address], [place], [place] [address]   Aerobic/Anaerobic Culture (surgical/deep wound)     Status: None (Preliminary result)   Collection Time: 11/11/19  6:00 PM   Specimen: PATH Soft tissue  Result Value Ref Range Status   Specimen Description   Final    TISSUE Performed at [place] [address], [place], [place] [address]    Special Requests LEFT FOOT  Final   Gram Stain   Final    RARE WBC PRESENT, PREDOMINANTLY PMN FEW GRAM POSITIVE COCCI IN PAIRS Performed at [facility], [place] [address], [place], [place] [address]    Culture PENDING  Incomplete   Report Status PENDING  Incomplete         Radiology Studies: DG Foot 2 Views Left  Result Date: 11/11/2019 CLINICAL DATA:  Status post left foot wound irrigation and debridement EXAM: LEFT FOOT - 2 VIEW COMPARISON:  11/08/2019 FINDINGS: Changes of remote transmetatarsal amputation of the 1st and 2nd toes. New transmetatarsal amputation of the 3rd toe. No visible complicating feature. IMPRESSION: As above. Electronically Signed   By: Rolm Baptise M.D.   On: 11/11/2019 18:44        Scheduled Meds: . amitriptyline   100 mg Oral QHS  . atorvastatin  40 mg Oral QPM  . docusate sodium  100 mg Oral BID  . enoxaparin (LOVENOX) injection  60 mg Subcutaneous Q24H  . folic acid  1 mg Oral Daily  . gabapentin  1,200 mg Oral QHS  . gabapentin  900 mg Oral BID WC  . glycopyrrolate  1 mg Oral BID  . insulin aspart  0-9 Units Subcutaneous Q4H  . lactobacillus acidophilus & bulgar  1 tablet Oral TID WC  . lisinopril  20 mg Oral  Daily  . multivitamin with minerals  1 tablet Oral Daily  . pantoprazole  40 mg Oral Daily  . primidone  50 mg Oral BID  . thiamine  100 mg Oral Daily   Or  . thiamine  100 mg Intravenous Daily   Continuous Infusions: . sodium chloride 20 mL/hr at 11/10/19 2009  . piperacillin-tazobactam (ZOSYN)  IV 3.375 g (11/12/19 0810)     LOS: 2 days    Time spent:25 mins. More than 50% of that time was spent in counseling and/or coordination of care.      Shelly Coss, MD [facility] Pager [phone]  If 7PM-7AM, please contact night-coverage [email or web address] Password [facility] 11/12/2019, 11:15 AM

## 2019-11-12 NOTE — Progress Notes (Signed)
Subjective:  Patient ID: Eugene Diaz, male    DOB: 12-06-1965,  MRN: [id number]  Patient seen bedside. Pain controlled. Had some bleeding and dressing was reinforced. Objective:   Vitals:   11/12/19 0414 11/12/19 0936  BP: (!) 161/81 (!) 150/78  Pulse: 75 87  Resp: 16 18  Temp: 98.4 F (36.9 C) 98.6 F (37 C)  SpO2: 98% 98%   Dressing removed, no active purulence, decreasing edema, erythema. Amputation site without significant necrosis. No ascending cellulitis, lymphangitis.  Results for orders placed or performed during the hospital encounter of 11/10/19  SARS CORONAVIRUS 2 (TAT 6-24 HRS) Nasopharyngeal Nasopharyngeal Swab     Status: None   Collection Time: 11/10/19  1:50 PM   Specimen: Nasopharyngeal Swab  Result Value Ref Range Status   SARS Coronavirus 2 NEGATIVE NEGATIVE Final    Comment: (NOTE) SARS-CoV-2 target nucleic acids are NOT DETECTED. The SARS-CoV-2 RNA is generally detectable in upper and lower respiratory specimens during the acute phase of infection. Negative results do not preclude SARS-CoV-2 infection, do not rule out co-infections with other pathogens, and should not be used as the sole basis for treatment or other patient management decisions. Negative results must be combined with clinical observations, patient history, and epidemiological information. The expected result is Negative. Fact Sheet for Patients: SugarRoll.be Fact Sheet for Healthcare Providers: https://www.woods-mathews.com/ This test is not yet approved or cleared by the [place] FDA and  has been authorized for detection and/or diagnosis of SARS-CoV-2 by FDA under an Emergency Use Authorization (EUA). This EUA will remain  in effect (meaning this test can be used) for the duration of the COVID-19 declaration under Section 56 4(b)(1) of the Act, 21 U.S.C. section 360bbb-3(b)(1), unless the authorization is terminated or revoked sooner.  Performed at [facility], [place] [address] [place], [place] [address]   MRSA PCR Screening     Status: None   Collection Time: 11/11/19  3:03 AM   Specimen: Nasal Mucosa; Nasopharyngeal  Result Value Ref Range Status   MRSA by PCR NEGATIVE NEGATIVE Final    Comment:        The GeneXpert MRSA Assay (FDA approved for NASAL specimens only), is one component of a comprehensive MRSA colonization surveillance program. It is not intended to diagnose MRSA infection nor to guide or monitor treatment for MRSA infections. Performed at [facility], [place] [address], [place], [place] [address]   Aerobic/Anaerobic Culture (surgical/deep wound)     Status: None (Preliminary result)   Collection Time: 11/11/19  6:00 PM   Specimen: PATH Soft tissue  Result Value Ref Range Status   Specimen Description   Final    TISSUE Performed at [place] [address] [place], [place] [address]    Special Requests LEFT FOOT  Final   Gram Stain   Final    RARE WBC PRESENT, PREDOMINANTLY PMN FEW GRAM POSITIVE COCCI IN PAIRS    Culture   Final    TOO YOUNG TO READ Performed at [facility], [place] [address] [place], [place] [address]    Report Status PENDING  Incomplete    Assessment & Plan:  Patient was evaluated and treated and all questions answered.  Left Foot osteomyelitis -Dressing removed, cellulitis improving. -Continue empiric IV abx.  -Dressing changed, gently packed with gauze. -Micro pending, culture too young to read. -Path pending. -To OR tomorrow for conversion to Midfoot amputation. Orders placed. -Plan for d/c Thursday or more  likely Friday  Evelina Bucy, DPM  Accessible via secure chat for questions or concerns.

## 2019-11-13 ENCOUNTER — Telehealth: Payer: Self-pay | Admitting: *Deleted

## 2019-11-13 ENCOUNTER — Inpatient Hospital Stay (HOSPITAL_COMMUNITY): Payer: Medicaid Other | Admitting: Anesthesiology

## 2019-11-13 ENCOUNTER — Inpatient Hospital Stay (HOSPITAL_COMMUNITY): Payer: Medicaid Other

## 2019-11-13 ENCOUNTER — Encounter (HOSPITAL_COMMUNITY)
Admission: EM | Disposition: A | Discharge status: Home Health | Payer: Self-pay | Source: Home / Self Care | Attending: Internal Medicine

## 2019-11-13 ENCOUNTER — Encounter (HOSPITAL_COMMUNITY): Payer: Self-pay | Admitting: Internal Medicine

## 2019-11-13 HISTORY — PX: AMPUTATION: SHX166

## 2019-11-13 LAB — FUNGUS CULTURE RESULT

## 2019-11-13 LAB — GLUCOSE, CAPILLARY
Glucose-Capillary: 103 mg/dL — ABNORMAL HIGH (ref 70–99)
Glucose-Capillary: 105 mg/dL — ABNORMAL HIGH (ref 70–99)
Glucose-Capillary: 121 mg/dL — ABNORMAL HIGH (ref 70–99)
Glucose-Capillary: 128 mg/dL — ABNORMAL HIGH (ref 70–99)
Glucose-Capillary: 132 mg/dL — ABNORMAL HIGH (ref 70–99)
Glucose-Capillary: 133 mg/dL — ABNORMAL HIGH (ref 70–99)
Glucose-Capillary: 133 mg/dL — ABNORMAL HIGH (ref 70–99)
Glucose-Capillary: 228 mg/dL — ABNORMAL HIGH (ref 70–99)

## 2019-11-13 LAB — SURGICAL PATHOLOGY

## 2019-11-13 LAB — FUNGUS CULTURE WITH STAIN

## 2019-11-13 LAB — FUNGAL ORGANISM REFLEX

## 2019-11-13 LAB — TYPE AND SCREEN
ABO/RH(D): A POS
Antibody Screen: NEGATIVE

## 2019-11-13 SURGERY — AMPUTATION, FOOT, RAY
Anesthesia: Monitor Anesthesia Care | Laterality: Left

## 2019-11-13 MED ORDER — LIDOCAINE HCL (PF) 1 % IJ SOLN
INTRAMUSCULAR | Status: AC
  Filled 2019-11-13: qty 30

## 2019-11-13 MED ORDER — FENTANYL CITRATE (PF) 100 MCG/2ML IJ SOLN: 25.0000 ug | INTRAMUSCULAR | Status: DC | PRN

## 2019-11-13 MED ORDER — FENTANYL CITRATE (PF) 100 MCG/2ML IJ SOLN
INTRAMUSCULAR | Status: AC
  Filled 2019-11-13: qty 2

## 2019-11-13 MED ORDER — MIDAZOLAM HCL 2 MG/2ML IJ SOLN
INTRAMUSCULAR | Status: AC
  Filled 2019-11-13: qty 2

## 2019-11-13 MED ORDER — VANCOMYCIN HCL 1000 MG IV SOLR
INTRAVENOUS | Status: AC
  Filled 2019-11-13: qty 1000

## 2019-11-13 MED ORDER — FENTANYL CITRATE (PF) 100 MCG/2ML IJ SOLN
INTRAMUSCULAR | Status: DC | PRN
  Administered 2019-11-13 (×2): 50 ug via INTRAVENOUS

## 2019-11-13 MED ORDER — VANCOMYCIN HCL 1 G IV SOLR
INTRAVENOUS | Status: DC | PRN
  Administered 2019-11-13: 1000 mg

## 2019-11-13 MED ORDER — GENTAMICIN SULFATE 40 MG/ML IJ SOLN
40.0000 mg | Freq: Once | INTRAMUSCULAR | Status: AC
  Administered 2019-11-13: 40 mg via INTRAMUSCULAR
  Filled 2019-11-13: qty 1

## 2019-11-13 MED ORDER — PROPOFOL 500 MG/50ML IV EMUL
INTRAVENOUS | Status: DC | PRN
  Administered 2019-11-13: 50 ug/kg/min via INTRAVENOUS

## 2019-11-13 MED ORDER — PROPOFOL 500 MG/50ML IV EMUL
INTRAVENOUS | Status: AC
  Filled 2019-11-13: qty 50

## 2019-11-13 MED ORDER — ONDANSETRON HCL 4 MG/2ML IJ SOLN: 4.0000 mg | Freq: Once | INTRAMUSCULAR | Status: DC | PRN

## 2019-11-13 MED ORDER — SODIUM CHLORIDE 0.9 % IR SOLN
Status: DC | PRN
  Administered 2019-11-13: 3000 mL

## 2019-11-13 MED ORDER — BUPIVACAINE HCL (PF) 0.25 % IJ SOLN
INTRAMUSCULAR | Status: AC
  Filled 2019-11-13: qty 30

## 2019-11-13 MED ORDER — BUPIVACAINE HCL (PF) 0.5 % IJ SOLN
INTRAMUSCULAR | Status: AC
  Filled 2019-11-13: qty 30

## 2019-11-13 MED ORDER — ONDANSETRON HCL 4 MG/2ML IJ SOLN
INTRAMUSCULAR | Status: DC | PRN
  Administered 2019-11-13: 4 mg via INTRAVENOUS

## 2019-11-13 MED ORDER — MIDAZOLAM HCL 5 MG/5ML IJ SOLN
INTRAMUSCULAR | Status: DC | PRN
  Administered 2019-11-13: 2 mg via INTRAVENOUS

## 2019-11-13 MED ORDER — PROPOFOL 500 MG/50ML IV EMUL
INTRAVENOUS | Status: DC | PRN
  Administered 2019-11-13 (×2): 20 mg via INTRAVENOUS
  Administered 2019-11-13: 10 mg via INTRAVENOUS

## 2019-11-13 MED ORDER — HYDROGEN PEROXIDE 3 % EX SOLN
CUTANEOUS | Status: AC
  Filled 2019-11-13: qty 473

## 2019-11-13 MED ORDER — BUPIVACAINE HCL (PF) 0.5 % IJ SOLN
INTRAMUSCULAR | Status: DC | PRN
  Administered 2019-11-13: 15 mL

## 2019-11-13 MED ORDER — LACTATED RINGERS IV SOLN: INTRAVENOUS | Status: DC

## 2019-11-13 SURGICAL SUPPLY — 55 items
BLADE OSCILLATING/SAGITTAL (BLADE)
BLADE SURG SZ10 CARB STEEL (BLADE) ×5 IMPLANT
BLADE SW THK.38XMED LNG THN (BLADE) IMPLANT
BNDG ELASTIC 4X5.8 VLCR STR LF (GAUZE/BANDAGES/DRESSINGS) ×2 IMPLANT
BNDG GAUZE ELAST 4 BULKY (GAUZE/BANDAGES/DRESSINGS) ×2 IMPLANT
COVER MAYO STAND STRL (DRAPES) ×2 IMPLANT
COVER SURGICAL LIGHT HANDLE (MISCELLANEOUS) ×2 IMPLANT
COVER WAND RF STERILE (DRAPES) ×1 IMPLANT
CUFF TOURN SGL QUICK 18X4 (TOURNIQUET CUFF) ×1 IMPLANT
CUFF TOURN SGL QUICK 24 (TOURNIQUET CUFF) ×2
CUFF TRNQT CYL 24X4X16.5-23 (TOURNIQUET CUFF) ×1 IMPLANT
DECANTER SPIKE VIAL GLASS SM (MISCELLANEOUS) ×1 IMPLANT
DRESSING PREVENA PLUS CUSTOM (GAUZE/BANDAGES/DRESSINGS) IMPLANT
DRSG PAD ABDOMINAL 8X10 ST (GAUZE/BANDAGES/DRESSINGS) ×2 IMPLANT
DRSG PREVENA PLUS CUSTOM (GAUZE/BANDAGES/DRESSINGS) ×4
ELECT REM PT RETURN 15FT ADLT (MISCELLANEOUS) ×2 IMPLANT
GAUZE SPONGE 4X4 12PLY STRL (GAUZE/BANDAGES/DRESSINGS) ×2 IMPLANT
GAUZE XEROFORM 5X9 LF (GAUZE/BANDAGES/DRESSINGS) IMPLANT
GLOVE BIO SURGEON STRL SZ7.5 (GLOVE) ×2 IMPLANT
GLOVE BIOGEL PI IND STRL 8 (GLOVE) ×1 IMPLANT
GLOVE BIOGEL PI INDICATOR 8 (GLOVE) ×1
GOWN STRL REUS W/TWL XL LVL3 (GOWN DISPOSABLE) ×2 IMPLANT
HANDPIECE INTERPULSE COAX TIP (DISPOSABLE)
IRRIG SUCT STRYKERFLOW 2 WTIP (MISCELLANEOUS) ×2
IRRIGATION SUCT STRKRFLW 2 WTP (MISCELLANEOUS) IMPLANT
KIT BASIN OR (CUSTOM PROCEDURE TRAY) ×2 IMPLANT
KIT PREVENA INCISION MGT 13 (CANNISTER) ×1 IMPLANT
KIT STIMULAN RAPID CURE  10CC (Orthopedic Implant) ×1 IMPLANT
KIT STIMULAN RAPID CURE 10CC (Orthopedic Implant) IMPLANT
MANIFOLD NEPTUNE II (INSTRUMENTS) ×2 IMPLANT
NDL HYPO 25X1 1.5 SAFETY (NEEDLE) IMPLANT
NDL SAFETY ECLIPSE 18X1.5 (NEEDLE) IMPLANT
NEEDLE HYPO 18GX1.5 SHARP (NEEDLE) ×4
NEEDLE HYPO 25X1 1.5 SAFETY (NEEDLE) ×2 IMPLANT
NS IRRIG 1000ML POUR BTL (IV SOLUTION) ×2 IMPLANT
PACK ORTHO EXTREMITY (CUSTOM PROCEDURE TRAY) ×2 IMPLANT
PAD CAST 4YDX4 CTTN HI CHSV (CAST SUPPLIES) IMPLANT
PADDING CAST COTTON 4X4 STRL (CAST SUPPLIES) ×2
SET HNDPC FAN SPRY TIP SCT (DISPOSABLE) IMPLANT
SPONGE LAP 18X18 RF (DISPOSABLE) ×1 IMPLANT
STAPLER VISISTAT 35W (STAPLE) ×1 IMPLANT
STOCKINETTE 8 INCH (MISCELLANEOUS) ×2 IMPLANT
SUT ETHILON 2 0 PS N (SUTURE) ×6 IMPLANT
SUT SILK 3 0 SH CR/8 (SUTURE) ×1 IMPLANT
SUT VIC AB 2-0 CT1 27 (SUTURE) ×2
SUT VIC AB 2-0 CT1 27XBRD (SUTURE) IMPLANT
SUT VIC AB 3-0 CT1 36 (SUTURE) ×2 IMPLANT
SWAB COLLECTION DEVICE MRSA (MISCELLANEOUS) ×1 IMPLANT
SWAB CULTURE ESWAB REG 1ML (MISCELLANEOUS) ×1 IMPLANT
SYR 20ML LL LF (SYRINGE) ×1 IMPLANT
SYR 3ML LL SCALE MARK (SYRINGE) ×1 IMPLANT
TOWEL OR 17X26 10 PK STRL BLUE (TOWEL DISPOSABLE) ×2 IMPLANT
TRAY PREP A LATEX SAFE STRL (SET/KITS/TRAYS/PACK) ×2 IMPLANT
UNDERPAD 30X36 HEAVY ABSORB (UNDERPADS AND DIAPERS) ×2 IMPLANT
YANKAUER SUCT BULB TIP 10FT TU (MISCELLANEOUS) ×2 IMPLANT

## 2019-11-13 NOTE — Brief Op Note (Signed)
11/13/2019  6:23 PM  PATIENT:  Doroteo Bradford Matlock  54 y.o. male  PRE-OPERATIVE DIAGNOSIS:  osteomyelitis  POST-OPERATIVE DIAGNOSIS:  osteomyelitis  PROCEDURE:  Procedure(s): Left Midfoot Amputation - Transmetatarsal vs. Lisfranc; Placement antibiotic beads (Left)  SURGEON:  Surgeon(s) and Role:    * Evelina Bucy, DPM - Primary  PHYSICIAN ASSISTANT:   ASSISTANTS: none   ANESTHESIA:   MAC, local  EBL:  25 ccs  BLOOD ADMINISTERED:none  DRAINS: none   LOCAL MEDICATIONS USED:  MARCAINE    and Amount: 15 ml  SPECIMEN:   ID Type Source Tests Collected by Time Destination  1 : fore foot,left foot Amputation Foot, Left SURGICAL PATHOLOGY Evelina Bucy, DPM 11/13/2019 1704       DISPOSITION OF SPECIMEN:  PATHOLOGY  COUNTS:  YES  TOURNIQUET:  Calf tourniquet 74 mins 250 mmHg DICTATION: .Note written in EPIC  PLAN OF CARE: transfer to floor  PATIENT DISPOSITION:  PACU - hemodynamically stable.   Delay start of Pharmacological VTE agent (>24hrs) due to surgical blood loss or risk of bleeding: not applicable

## 2019-11-13 NOTE — Anesthesia Postprocedure Evaluation (Signed)
Anesthesia Post Note  Patient: Eugene Diaz  Procedure(s) Performed: Left Midfoot Amputation - Transmetatarsal vs. Lisfranc; Placement antibiotic beads (Left )     Patient location during evaluation: PACU Anesthesia Type: MAC Level of consciousness: awake and alert Pain management: pain level controlled Vital Signs Assessment: post-procedure vital signs reviewed and stable Respiratory status: spontaneous breathing, nonlabored ventilation, respiratory function stable and patient connected to nasal cannula oxygen Cardiovascular status: stable and blood pressure returned to baseline Postop Assessment: no apparent nausea or vomiting Anesthetic complications: no    Last Vitals:  Vitals:   11/13/19 1900 11/13/19 1918  BP: (!) 165/87 (!) 168/89  Pulse: 63 66  Resp: 12 14  Temp:  (!) 36.3 C  SpO2: 100% 100%    Last Pain:  Vitals:   11/13/19 1918  TempSrc: Oral  PainSc:                  [name]

## 2019-11-13 NOTE — Transfer of Care (Signed)
Immediate Anesthesia Transfer of Care Note  Patient: Eugene Diaz  Procedure(s) Performed: Left Midfoot Amputation - Transmetatarsal vs. Lisfranc; Placement antibiotic beads (Left )  Patient Location: PACU  Anesthesia Type:MAC  Level of Consciousness: awake and alert   Airway & Oxygen Therapy: Patient Spontanous Breathing and Patient connected to face mask oxygen  Post-op Assessment: Report given to RN and Post -op Vital signs reviewed and stable  Post vital signs: Reviewed and stable  Last Vitals:  Vitals Value Taken Time  BP 123/99 11/13/19 1827  Temp    Pulse 72 11/13/19 1830  Resp 13 11/13/19 1830  SpO2 100 % 11/13/19 1830  Vitals shown include unvalidated device data.  Last Pain:  Vitals:   11/13/19 1422  TempSrc: Oral  PainSc:       Patients Stated Pain Goal: 3 (50/56/97 9480)  Complications: No apparent anesthesia complications

## 2019-11-13 NOTE — Telephone Encounter (Signed)
Pt sister, [name] states she is pt's caregiver, pt is in the hospital again and to have surgery with [name] today at 1:00pm and she would like to discuss details to the surgery prior to the surgery.

## 2019-11-13 NOTE — Progress Notes (Signed)
Dr Ola Spurr aware of SBP> 160.  States pt may return to room with SBP at or lower than 180, DBP at or lower than 100.

## 2019-11-13 NOTE — Anesthesia Preprocedure Evaluation (Addendum)
Anesthesia Evaluation  Patient identified by MRN, date of birth, ID band Patient awake    Reviewed: Allergy & Precautions, NPO status , Patient's Chart, lab work & pertinent test results  Airway Mallampati: II  TM Distance: >3 FB Neck ROM: Full    Dental  (+) Poor Dentition, Dental Advisory Given,  Extremely poor dentition:   Pulmonary asthma , COPD, former smoker,  Quit smoking 1985- 62.5 pack year history  Uses rescue inhaler daily- uses 3-4x/d   Pulmonary exam normal breath sounds clear to auscultation       Cardiovascular hypertension, Pt. on medications + Peripheral Vascular Disease and +CHF  Normal cardiovascular exam Rhythm:Regular Rate:Normal  ECG: SR, rate 77  Last echo 2015: Mild LVH with LVEF 55-60%, grade 2  diastolic dysfunction. Mild MAC with trivial mtiral  regurgitation. Mildly sclerotic aortic valve. Mildly  reduced RV contraction. Unable to assess PASP. No  pericardial effusion.    Neuro/Psych PSYCHIATRIC DISORDERS Anxiety Depression Essential tremor Alcohol related peripheral neuropathy  Neuromuscular disease    GI/Hepatic PUD, GERD  Medicated,(+)     substance abuse  alcohol use,   Endo/Other  negative endocrine ROSdiabetes  Renal/GU negative Renal ROS  negative genitourinary   Musculoskeletal  (+) Arthritis , narcotic dependentOsteomyelitis of left foot. Dehiscence of wound   Abdominal (+) + obese,   Peds  Hematology  (+) anemia , HLD   Anesthesia Other Findings   Reproductive/Obstetrics negative OB ROS                             Lab Results  Component Value Date   WBC 6.0 11/12/2019   HGB 8.2 (L) 11/12/2019   HCT 26.2 (L) 11/12/2019   MCV 92.6 11/12/2019   PLT 276 11/12/2019   Lab Results  Component Value Date   CREATININE 0.96 11/12/2019   BUN 10 11/12/2019   NA 135 11/12/2019   K 3.8 11/12/2019   CL 101 11/12/2019   CO2 26 11/12/2019     Anesthesia Physical  Anesthesia Plan  ASA: III  Anesthesia Plan: MAC   Post-op Pain Management:    Induction:   PONV Risk Score and Plan: 1 and Ondansetron, Treatment may vary due to age or medical condition and Propofol infusion  Airway Management Planned: Natural Airway and Simple Face Mask  Additional Equipment: None  Intra-op Plan:   Post-operative Plan:   Informed Consent: I have reviewed the patients History and Physical, chart, labs and discussed the procedure including the risks, benefits and alternatives for the proposed anesthesia with the patient or authorized representative who has indicated his/her understanding and acceptance.     Dental advisory given  Plan Discussed with: CRNA and Surgeon  Anesthesia Plan Comments:        Anesthesia Quick Evaluation

## 2019-11-13 NOTE — Progress Notes (Signed)
  Subjective:  Patient ID: Eugene Diaz, male    DOB: May 01, 1966,  MRN: [id number]  Patient seen in pre-op. Understands plan for OR. Objective:   Vitals:   11/13/19 1259 11/13/19 1422  BP: (!) 159/84 (!) 166/86  Pulse: 68 71  Resp: 18 14  Temp: 98.7 F (37.1 C) 98.3 F (36.8 C)  SpO2: 98% 95%   Dressing intact.  Results for orders placed or performed during the hospital encounter of 11/10/19  SARS CORONAVIRUS 2 (TAT 6-24 HRS) Nasopharyngeal Nasopharyngeal Swab     Status: None   Collection Time: 11/10/19  1:50 PM   Specimen: Nasopharyngeal Swab  Result Value Ref Range Status   SARS Coronavirus 2 NEGATIVE NEGATIVE Final    Comment: (NOTE) SARS-CoV-2 target nucleic acids are NOT DETECTED. The SARS-CoV-2 RNA is generally detectable in upper and lower respiratory specimens during the acute phase of infection. Negative results do not preclude SARS-CoV-2 infection, do not rule out co-infections with other pathogens, and should not be used as the sole basis for treatment or other patient management decisions. Negative results must be combined with clinical observations, patient history, and epidemiological information. The expected result is Negative. Fact Sheet for Patients: SugarRoll.be Fact Sheet for Healthcare Providers: https://www.woods-mathews.com/ This test is not yet approved or cleared by the [place] FDA and  has been authorized for detection and/or diagnosis of SARS-CoV-2 by FDA under an Emergency Use Authorization (EUA). This EUA will remain  in effect (meaning this test can be used) for the duration of the COVID-19 declaration under Section 56 4(b)(1) of the Act, 21 U.S.C. section 360bbb-3(b)(1), unless the authorization is terminated or revoked sooner. Performed at [facility], [place] [address], [place], [place] [address]   MRSA PCR Screening     Status: None   Collection Time: 11/11/19  3:03 AM   Specimen:  Nasal Mucosa; Nasopharyngeal  Result Value Ref Range Status   MRSA by PCR NEGATIVE NEGATIVE Final    Comment:        The GeneXpert MRSA Assay (FDA approved for NASAL specimens only), is one component of a comprehensive MRSA colonization surveillance program. It is not intended to diagnose MRSA infection nor to guide or monitor treatment for MRSA infections. Performed at [facility], [place] [address], [place], [place] [address]   Aerobic/Anaerobic Culture (surgical/deep wound)     Status: None (Preliminary result)   Collection Time: 11/11/19  6:00 PM   Specimen: PATH Soft tissue  Result Value Ref Range Status   Specimen Description   Final    TISSUE Performed at [facility] [address], [place], [place] [address]    Special Requests LEFT FOOT  Final   Gram Stain   Final    RARE WBC PRESENT, PREDOMINANTLY PMN FEW GRAM POSITIVE COCCI IN PAIRS Performed at [facility], [place] [address] [place], [place] [address]    Culture FEW PSEUDOMONAS AERUGINOSA  Final   Report Status PENDING  Incomplete    Assessment & Plan:  Patient was evaluated and treated and all questions answered.  Left Foot osteomyelitis -Dressing intact -Continue empiric IV abx.  -Micro reviewed - Pseudomonas -Path confirmatory of OM. -To OR for conversion to Midfoot amputation. Consent reviewed and signed. -Plan for d/c Thursday or more likely Friday  Evelina Bucy, DPM  Accessible via secure chat for questions or concerns.

## 2019-11-13 NOTE — Telephone Encounter (Signed)
Called and spoke to patient's sister. Discussed clinical condition, pending surgery. Discussed barriers to success of patient's care. Patient is very depressed, sister states she will continue to work on motivating him to take better care of himself.  Discussed ordering [place] after surgery. Will order, hopefully will meet criteria. Discussed importance of diet, avoiding alcohol, and staying off of his foot.  Plan for surgery later this afternoon.

## 2019-11-13 NOTE — Progress Notes (Signed)
PROGRESS NOTE    Eugene Diaz  [id number] DOB: 1966-09-05 DOA: 11/10/2019 PCP: [name], PA-C   Brief Narrative: Patient is a 54 year old male with history of diabetes type 2 with severe peripheral neuropathy, left diabetic foot infection, status post left foot debridement with left partial second ray resection with antibiotic bead placement on 10/06/2019, hypertension, alcohol dependence, marijuana abuse, anemia of chronic disease who presented with suspected left foot infection.  He was sent by his podiatrist [name] for further evaluation of possible left foot with gangrene/leg cellulitis.  Started on antibiotics.  He underwent amputation of left third toe, amputation of left third metatarsal by [name] on 11/11/2019.  Plan for midfoot  amputation  on 11/13/19. Currently hemodynamically stable.  Assessment & Plan:   Principal Problem:   Acute osteomyelitis of toe of left foot (HCC) Active Problems:   Hypertension   ETOH abuse   Diabetic neuropathy (HCC)   Diabetic infection of left foot (HCC)   Osteomyelitis of foot (HCC)   Wound dehiscence   Anemia, chronic disease   Acute osteomyelitis of left foot (HCC)   Marijuana use   Left foot osteomyelitis: Continue Zosyn for now.  Wound culture showing gram-positive cocci in pairs.  Currently hemodynamically stable.  He underwent amputation of left third toe, amputation of left third metatarsal by [name] on 11/11/2019.  Plan for mid foot amputation by [name] on 11/13/19  Diabetes type 2: Controlled.  Hemoglobin A1c 5.3.  Continue current regimen.  He was on oral antihyperglycemics at home.  Diabetic neuropathy: On Neurontin  Anemia of chronic disease: Currently H&H stable.  Continue to monitor  Hypertension: Hypertensive this morning.  Continue current medicines  Hyperlipidemia: Continue Lipitor  Chronic alcohol abuse: Started on thiamine and folic acid.  Obesity: BMI of 34.         DVT prophylaxis:  Lovenox Code Status: Full Family Communication: None present the bedside Disposition Plan: Home after podiatry clearance   Consultants: Podiatry  Procedures: As above  Antimicrobials:  Anti-infectives (From admission, onward)   Start     Dose/Rate Route Frequency Ordered Stop   11/11/19 1749  vancomycin (VANCOCIN) powder  Status:  Discontinued       As needed 11/11/19 1749 11/11/19 1855   11/11/19 0400  vancomycin (VANCOREADY) IVPB 1250 mg/250 mL  Status:  Discontinued     1,250 mg 166.7 mL/hr over 90 Minutes Intravenous Every 12 hours 11/10/19 1621 11/11/19 0811   11/10/19 1615  vancomycin (VANCOREADY) IVPB 1500 mg/300 mL     1,500 mg 150 mL/hr over 120 Minutes Intravenous STAT 11/10/19 1607 11/10/19 1850   11/10/19 1600  piperacillin-tazobactam (ZOSYN) IVPB 3.375 g  Status:  Discontinued     3.375 g 100 mL/hr over 30 Minutes Intravenous Every 8 hours 11/10/19 1546 11/10/19 1554   11/10/19 1600  piperacillin-tazobactam (ZOSYN) IVPB 3.375 g     3.375 g 12.5 mL/hr over 240 Minutes Intravenous Every 8 hours 11/10/19 1554     11/10/19 1330  vancomycin (VANCOCIN) IVPB 1000 mg/200 mL premix     1,000 mg 200 mL/hr over 60 Minutes Intravenous  Once 11/10/19 1319 11/10/19 1452      Subjective:  Patient seen and examined at the bedside this morning.  Hemodynamically stable.  Pain well controlled.  Waiting for surgery today.   Objective: Vitals:   11/12/19 2025 11/13/19 0419 11/13/19 0738 11/13/19 1259  BP: (!) 158/83 (!) 154/82 (!) 154/88 (!) 159/84  Pulse: 67 65 69  68  Resp: 18 16 18 18   Temp: (!) 97.5 F (36.4 C) 97.8 F (36.6 C) 98.3 F (36.8 C) 98.7 F (37.1 C)  TempSrc:  Oral Oral Oral  SpO2: 99% 100% 96% 98%  Weight:      Height:        Intake/Output Summary (Last 24 hours) at 11/13/2019 1344 Last data filed at 11/13/2019 0954 Gross per 24 hour  Intake 977.61 ml  Output --  Net 977.61 ml   Filed Weights   11/10/19 1156  Weight: 122.7 kg     Examination:  General exam: Appears calm and comfortable ,Not in distress,obese HEENT:PERRL,Oral mucosa moist, Ear/Nose normal on gross exam Respiratory system: Bilateral equal air entry, normal vesicular breath sounds, no wheezes or crackles  Cardiovascular system: S1 & S2 heard, RRR. No JVD, murmurs, rubs, gallops or clicks. No pedal edema. Gastrointestinal system: Abdomen is nondistended, soft and nontender. No organomegaly or masses felt. Normal bowel sounds heard. Central nervous system: Alert and oriented. No focal neurological deficits. Extremities: No edema, no clubbing ,no cyanosis, left foot wrapped with dressing Skin: No rashes, lesions or ulcers,no icterus ,no pallor      Data Reviewed: I have personally reviewed following labs and imaging studies  CBC: Recent Labs  Lab 11/10/19 1350 11/10/19 1655 11/11/19 0326 11/12/19 0338  WBC 6.4 5.6 4.3 6.0  NEUTROABS 4.5  --   --  3.8  HGB 9.0* 8.7* 8.6* 8.2*  HCT 28.6* 27.5* 28.0* 26.2*  MCV 91.1 93.2 91.8 92.6  PLT 309 267 297 [id number]   Basic Metabolic Panel: Recent Labs  Lab 11/10/19 1350 11/10/19 1655 11/11/19 0326 11/12/19 0338  NA 134* 137 139 135  K 4.2 4.2 4.7 3.8  CL 98 99 102 101  CO2 26 28 29 26   GLUCOSE 244* 178* 186* 126*  BUN 13 12 11 10   CREATININE 0.98 0.93 1.18 0.96  CALCIUM 8.9 8.7* 8.9 8.4*  MG  --  1.8  --  1.8  PHOS  --  3.6  --   --    GFR: Estimated Creatinine Clearance: 122.4 mL/min (by C-G formula based on SCr of 0.96 mg/dL). Liver Function Tests: Recent Labs  Lab 11/10/19 1655 11/12/19 0338  AST 15 13*  ALT 11 9  ALKPHOS 95 77  BILITOT 0.5 0.6  PROT 7.7 7.1  ALBUMIN 2.8* 2.7*   No results for input(s): LIPASE, AMYLASE in the last 168 hours. No results for input(s): AMMONIA in the last 168 hours. Coagulation Profile: Recent Labs  Lab 11/11/19 0326 11/12/19 0338  INR 1.1 1.0   Cardiac Enzymes: No results for input(s): CKTOTAL, CKMB, CKMBINDEX, TROPONINI in the last  168 hours. BNP (last 3 results) No results for input(s): PROBNP in the last 8760 hours. HbA1C: Recent Labs    11/10/19 1655  HGBA1C 6.4*   CBG: Recent Labs  Lab 11/12/19 2016 11/13/19 0020 11/13/19 0414 11/13/19 0750 11/13/19 1141  GLUCAP 160* 121* 132* 133* 128*   Lipid Profile: No results for input(s): CHOL, HDL, LDLCALC, TRIG, CHOLHDL, LDLDIRECT in the last 72 hours. Thyroid Function Tests: No results for input(s): TSH, T4TOTAL, FREET4, T3FREE, THYROIDAB in the last 72 hours. Anemia Panel: No results for input(s): VITAMINB12, FOLATE, FERRITIN, TIBC, IRON, RETICCTPCT in the last 72 hours. Sepsis Labs: No results for input(s): PROCALCITON, LATICACIDVEN in the last 168 hours.  Recent Results (from the past 240 hour(s))  SARS CORONAVIRUS 2 (TAT 6-24 HRS) Nasopharyngeal Nasopharyngeal Swab     Status: None  Collection Time: 11/10/19  1:50 PM   Specimen: Nasopharyngeal Swab  Result Value Ref Range Status   SARS Coronavirus 2 NEGATIVE NEGATIVE Final    Comment: (NOTE) SARS-CoV-2 target nucleic acids are NOT DETECTED. The SARS-CoV-2 RNA is generally detectable in upper and lower respiratory specimens during the acute phase of infection. Negative results do not preclude SARS-CoV-2 infection, do not rule out co-infections with other pathogens, and should not be used as the sole basis for treatment or other patient management decisions. Negative results must be combined with clinical observations, patient history, and epidemiological information. The expected result is Negative. Fact Sheet for Patients: SugarRoll.be Fact Sheet for Healthcare Providers: https://www.woods-mathews.com/ This test is not yet approved or cleared by the [place] FDA and  has been authorized for detection and/or diagnosis of SARS-CoV-2 by FDA under an Emergency Use Authorization (EUA). This EUA will remain  in effect (meaning this test can be used) for  the duration of the COVID-19 declaration under Section 56 4(b)(1) of the Act, 21 U.S.C. section 360bbb-3(b)(1), unless the authorization is terminated or revoked sooner. Performed at [facility], [place] [address], [place], [place] [address]   MRSA PCR Screening     Status: None   Collection Time: 11/11/19  3:03 AM   Specimen: Nasal Mucosa; Nasopharyngeal  Result Value Ref Range Status   MRSA by PCR NEGATIVE NEGATIVE Final    Comment:        The GeneXpert MRSA Assay (FDA approved for NASAL specimens only), is one component of a comprehensive MRSA colonization surveillance program. It is not intended to diagnose MRSA infection nor to guide or monitor treatment for MRSA infections. Performed at [facility], [place] [address], [place], [place] [address]   Aerobic/Anaerobic Culture (surgical/deep wound)     Status: None (Preliminary result)   Collection Time: 11/11/19  6:00 PM   Specimen: PATH Soft tissue  Result Value Ref Range Status   Specimen Description   Final    TISSUE Performed at [facility] [address], [place], [place] [address]    Special Requests LEFT FOOT  Final   Gram Stain   Final    RARE WBC PRESENT, PREDOMINANTLY PMN FEW GRAM POSITIVE COCCI IN PAIRS Performed at [facility], [place] [address], [place], [place] [address]    Culture FEW PSEUDOMONAS AERUGINOSA  Final   Report Status PENDING  Incomplete         Radiology Studies: DG Foot 2 Views Left  Result Date: 11/11/2019 CLINICAL DATA:  Status post left foot wound irrigation and debridement EXAM: LEFT FOOT - 2 VIEW COMPARISON:  11/08/2019 FINDINGS: Changes of remote transmetatarsal amputation of the 1st and 2nd toes. New transmetatarsal amputation of the 3rd toe. No visible complicating feature. IMPRESSION: As above. Electronically Signed   By: Rolm Baptise M.D.   On: 11/11/2019 18:44        Scheduled Meds: . amitriptyline  100 mg Oral QHS  .  atorvastatin  40 mg Oral QPM  . docusate sodium  100 mg Oral BID  . enoxaparin (LOVENOX) injection  60 mg Subcutaneous Q24H  . folic acid  1 mg Oral Daily  . gabapentin  1,200 mg Oral QHS  . gabapentin  900 mg Oral BID WC  . glycopyrrolate  1 mg Oral BID  . insulin aspart  0-9 Units Subcutaneous Q4H  . lactobacillus acidophilus & bulgar  1 tablet Oral TID WC  . lisinopril  20  mg Oral Daily  . multivitamin with minerals  1 tablet Oral Daily  . pantoprazole  40 mg Oral Daily  . primidone  50 mg Oral BID  . thiamine  100 mg Oral Daily   Or  . thiamine  100 mg Intravenous Daily   Continuous Infusions: . sodium chloride 20 mL/hr at 11/10/19 2009  . piperacillin-tazobactam (ZOSYN)  IV 3.375 g (11/13/19 0940)     LOS: 3 days    Time spent:25 mins. More than 50% of that time was spent in counseling and/or coordination of care.      Shelly Coss, MD [facility] Pager [phone]  If 7PM-7AM, please contact night-coverage [email or web address] Password [email or web address] 11/13/2019, 1:44 PM

## 2019-11-14 ENCOUNTER — Encounter: Payer: Self-pay | Admitting: *Deleted

## 2019-11-14 LAB — CBC WITH DIFFERENTIAL/PLATELET
Abs Immature Granulocytes: 0.07 10*3/uL (ref 0.00–0.07)
Basophils Absolute: 0 10*3/uL (ref 0.0–0.1)
Basophils Relative: 1 %
Eosinophils Absolute: 0.3 10*3/uL (ref 0.0–0.5)
Eosinophils Relative: 4 %
HCT: 25.6 % — ABNORMAL LOW (ref 39.0–52.0)
Hemoglobin: 7.9 g/dL — ABNORMAL LOW (ref 13.0–17.0)
Immature Granulocytes: 1 %
Lymphocytes Relative: 22 %
Lymphs Abs: 1.5 10*3/uL (ref 0.7–4.0)
MCH: 28.4 pg (ref 26.0–34.0)
MCHC: 30.9 g/dL (ref 30.0–36.0)
MCV: 92.1 fL (ref 80.0–100.0)
Monocytes Absolute: 0.4 10*3/uL (ref 0.1–1.0)
Monocytes Relative: 6 %
Neutro Abs: 4.5 10*3/uL (ref 1.7–7.7)
Neutrophils Relative %: 66 %
Platelets: 328 10*3/uL (ref 150–400)
RBC: 2.78 MIL/uL — ABNORMAL LOW (ref 4.22–5.81)
RDW: 14.1 % (ref 11.5–15.5)
WBC: 6.9 10*3/uL (ref 4.0–10.5)
nRBC: 0 % (ref 0.0–0.2)

## 2019-11-14 LAB — GLUCOSE, CAPILLARY
Glucose-Capillary: 100 mg/dL — ABNORMAL HIGH (ref 70–99)
Glucose-Capillary: 105 mg/dL — ABNORMAL HIGH (ref 70–99)
Glucose-Capillary: 147 mg/dL — ABNORMAL HIGH (ref 70–99)
Glucose-Capillary: 109 mg/dL — ABNORMAL HIGH (ref 70–99)
Glucose-Capillary: 205 mg/dL — ABNORMAL HIGH (ref 70–99)

## 2019-11-14 MED ORDER — AMLODIPINE BESYLATE 10 MG PO TABS
10.0000 mg | ORAL_TABLET | Freq: Every day | ORAL | Status: DC
  Administered 2019-11-14 – 2019-11-15 (×2): 10 mg via ORAL
  Filled 2019-11-14 (×2): qty 1

## 2019-11-14 NOTE — TOC Progression Note (Signed)
Transition of Care [facility]) - Progression Note    Patient Details  Name: Eugene Diaz MRN: [id number] Date of Birth: 1965-11-11  [facility] CM/SW Contact  Purcell Mouton, RN Phone Number: 11/14/2019, 2:58 PM  Clinical Narrative:    Informed Dr. Tawanna Solo and [name] who both were very pleasant concerning pt's discharge plan. A call to the following [employer], [facility], [place], [facility] revealed that they did not have the staff to service pt at home. Spoke with pt who was also pleasant to ask him if he would like to go to a SNF. Pt states No, that his mother and brother will be home with him.     Expected Discharge Plan and Services                                                 Social Determinants of Health (SDOH) Interventions    Readmission Risk Interventions No flowsheet data found.

## 2019-11-14 NOTE — Progress Notes (Signed)
Subjective:  Patient ID: Eugene Diaz, male    DOB: Feb 09, 1966,  MRN: [id number]  Patient seen bedside. Good spirits. Would like to go home. Pain controlled. Objective:   Vitals:   11/14/19 0920 11/14/19 1402  BP: (!) 153/85 (!) 168/83  Pulse: 96 84  Resp: 16 18  Temp: 99.2 F (37.3 C) 98.8 F (37.1 C)  SpO2: 98% 97%   Dressing intact. Wound VAC on and functioning.  Results for orders placed or performed during the hospital encounter of 11/10/19  SARS CORONAVIRUS 2 (TAT 6-24 HRS) Nasopharyngeal Nasopharyngeal Swab     Status: None   Collection Time: 11/10/19  1:50 PM   Specimen: Nasopharyngeal Swab  Result Value Ref Range Status   SARS Coronavirus 2 NEGATIVE NEGATIVE Final    Comment: (NOTE) SARS-CoV-2 target nucleic acids are NOT DETECTED. The SARS-CoV-2 RNA is generally detectable in upper and lower respiratory specimens during the acute phase of infection. Negative results do not preclude SARS-CoV-2 infection, do not rule out co-infections with other pathogens, and should not be used as the sole basis for treatment or other patient management decisions. Negative results must be combined with clinical observations, patient history, and epidemiological information. The expected result is Negative. Fact Sheet for Patients: SugarRoll.be Fact Sheet for Healthcare Providers: https://www.woods-mathews.com/ This test is not yet approved or cleared by the [place] FDA and  has been authorized for detection and/or diagnosis of SARS-CoV-2 by FDA under an Emergency Use Authorization (EUA). This EUA will remain  in effect (meaning this test can be used) for the duration of the COVID-19 declaration under Section 56 4(b)(1) of the Act, 21 U.S.C. section 360bbb-3(b)(1), unless the authorization is terminated or revoked sooner. Performed at [facility], [place] [address], [place], [place] [address]   MRSA PCR Screening     Status:  None   Collection Time: 11/11/19  3:03 AM   Specimen: Nasal Mucosa; Nasopharyngeal  Result Value Ref Range Status   MRSA by PCR NEGATIVE NEGATIVE Final    Comment:        The GeneXpert MRSA Assay (FDA approved for NASAL specimens only), is one component of a comprehensive MRSA colonization surveillance program. It is not intended to diagnose MRSA infection nor to guide or monitor treatment for MRSA infections. Performed at [facility], [place] [address], [place], [place] [address]   Aerobic/Anaerobic Culture (surgical/deep wound)     Status: None (Preliminary result)   Collection Time: 11/11/19  6:00 PM   Specimen: PATH Soft tissue  Result Value Ref Range Status   Specimen Description   Final    TISSUE Performed at [facility] [address] [place], [place] [address]    Special Requests LEFT FOOT  Final   Gram Stain   Final    RARE WBC PRESENT, PREDOMINANTLY PMN FEW GRAM POSITIVE COCCI IN PAIRS    Culture   Final    FEW PSEUDOMONAS AERUGINOSA ABUNDANT STREPTOCOCCUS MITIS/ORALIS ABUNDANT BACTEROIDES FRAGILIS BETA LACTAMASE POSITIVE Performed at [facility], [place] [address], [place], [place] [address]    Report Status PENDING  Incomplete   Organism ID, Bacteria PSEUDOMONAS AERUGINOSA  Final      Susceptibility   Pseudomonas aeruginosa - MIC*    CEFTAZIDIME 4 SENSITIVE Sensitive     CIPROFLOXACIN 2 INTERMEDIATE Intermediate     GENTAMICIN <=1 SENSITIVE Sensitive     IMIPENEM 2 SENSITIVE Sensitive     PIP/TAZO 8 SENSITIVE Sensitive     *  FEW PSEUDOMONAS AERUGINOSA    Assessment & Plan:  Patient was evaluated and treated and all questions answered.  Left Foot osteomyelitis -Dressing intact -Continue empiric IV abx.  -Micro reviewed. Multiple organisms. -Appreciate ID input for Home abx reccs. I do believe surgical cure of OM given full disarticulation of affected bones. -Wound VAC on and functioning. Will change prior  to d/c -Order for [facility] placed at D/c. Remove wound VAC Monday or Tuesday, apply aquacell and DSD. Change twice weekly. -Plan for d/c tomorrow.  Evelina Bucy, DPM  Accessible via secure chat for questions or concerns.

## 2019-11-14 NOTE — Op Note (Signed)
Patient Name: Eugene Diaz DOB: June 08, 1966  MRN: [id number]   Date of Service: 11/13/19  Surgeon: Dr. Hardie Pulley, DPM Assistants: None Pre-operative Diagnosis:  Osteomyelitis, diabetic foot infection left Post-operative Diagnosis:  same Procedures:  1) Lisfranc Amputation Left foot  2) Insertion abx beads  3) Application of wound VAC Pathology/Specimens: ID Type Source Tests Collected by Time Destination  1 : fore foot,left foot Amputation Foot, Left SURGICAL PATHOLOGY Evelina Bucy, DPM 11/13/2019 1704    Anesthesia: MAC/local Hemostasis:  Total Tourniquet Time Documented: Calf (Left) - 34 minutes Calf (Left) - 42 minutes Total: Calf (Left) - 76 minutes  Estimated Blood Loss: 20 mL Materials:  Implant Name Type Inv. Item Serial No. Manufacturer Lot No. LRB No. Used Action  KIT STIMULAN RAPID CURE  10CC - [id number] Orthopedic Implant KIT STIMULAN RAPID CURE  10CC  [employer] [id number] Left 1 Implanted   Medications: 1g Vancomycin, 20 mg Gentamicin (as part of antibiotic beads) Complications: none  Indications for Procedure:  This is a 54 y.o. 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 This has been complicated by non-compliance. Most recently the wound has worsened with necrosis and exposed bone with osteomyelitis. Given the multiple digital amputations and continued infection, midfoot amputation indicated to provide stable foot type.   Procedure in Detail: Patient was identified in pre-operative holding area. Formal consent was signed and the left lower extremity was marked. Patient was brought back to the operating room. Anesthesia was induced. The extremity was prepped and draped in the usual sterile fashion. Timeout was taken to confirm patient name, laterality, and procedure prior to incision.   Attention was then directed to the left foot. The wound was copiously irrigated with pulse lavage 3L. A fishmouth incision was made at the distal aspect of the  metatarsophalangeal joints. Dissection was carried down through skin and subcutaneous tissue with care to avoid all vital neurovascular structures.  All bleeders were cauterized.  The remaining 4th and 5th toes were disarticulated at the metatarsophalangeal joints. Dissection was carried down to level of the previous resection margin of the remaining metatarsals.  Full-thickness flaps were raised dorsally and plantarly.  Dissection was continued down to the level of the metatarsal bases.  Each metatarsal was disarticulated at the metatarsal bases.  The metatarsals were then removed in total.  The remaining tarsal bones appeared healthy and viable.  The wound was then copiously thoroughly pulse lavaged for a total of 3 L of normal saline. Major vessels were tied off with 3-0 silk. The wound margins were then freshened and reapproximated. The tourniquet was temporarily deflated and good perfusion was noted to the flaps. The tourniquet was reinflated.  Stimulan antibiotic beads fashioned with vancomycin and gentamicin were packed deep into the wound. Deep closure was performed with 2-0 and Vicryl to minimize dead space.  The skin was closed in layers with 3-0 Vicryl and reapproximated with 2-0 nylon and skin staples. An incisional wound VAC sponge was applied over the incision, which was then adhered with the adherent dressing, and set to 125 mm suction with good seal noted.   The foot was then dressed with cast padding and ACE bandage. Patient tolerated the procedure well.  Disposition: Following a period of post-operative monitoring, patient will be transferred back to the floor. This should resolve any residual osteomyelitis. He will still need continued wound care. He may need tendon lengthening later to avoid contracture. We will continue to monitor. Will plan to reapply the wound  VAC to promote healing.

## 2019-11-14 NOTE — Evaluation (Signed)
Physical Therapy Evaluation Patient Details Name: Eugene Diaz MRN: [id number] DOB: [birth year] Today's Date: 11/14/2019   History of Present Illness  Patient is a 54 year old male with history of diabetes type 2 with severe peripheral neuropathy, left diabetic foot infection, status post left foot debridement with left partial second ray resection with antibiotic bead placement on 10/06/2019, hypertension, alcohol dependence, marijuana abuse, anemia of chronic disease who presented with suspected left foot infection.  He was sent by his podiatrist [name] for further evaluation of possible left foot with gangrene/leg cellulitis.  Started on antibiotics.  He underwent amputation of left third toe, amputation of left third metatarsal by [name] on 11/11/2019. Underwent   midfoot  amputation  on 11/13/19.    Clinical Impression  Eugene Diaz is a 54 y.o. male POD 0 s/p Lt midfoot amputation. Patient reports modified independence with SPC for mobility at baseline. Patient is now limited by functional impairments (see PT problem list below) and requires min assist for transfers and gait with RW. Patient was able to ambulate ~60 feet with RW and min assist; and maintained NWB status. Patient will benefit from continued skilled PT interventions to address impairments and progress towards PLOF. Acute PT will follow to progress mobility and stair training in preparation for safe discharge home.    Follow Up Recommendations No PT follow up    Equipment Recommendations  None recommended by PT    Recommendations for Other Services       Precautions / Restrictions Precautions Precautions: Fall Restrictions Weight Bearing Restrictions: Yes LLE Weight Bearing: Non weight bearing Other Position/Activity Restrictions: per secure chat with [name], pt should be NWB on Lt LE      Mobility  Bed Mobility Overal bed mobility: Modified Independent Bed Mobility: Supine to Sit     Supine to sit:  Modified independent (Device/Increase time);HOB elevated        Transfers Overall transfer level: Needs assistance Equipment used: Rolling walker (2 wheeled) Transfers: Sit to/from Stand Sit to Stand: Min assist         General transfer comment: min assist from EOB, cues to maintain NWB on Lt LE  Ambulation/Gait Ambulation/Gait assistance: Min guard;Min assist Gait Distance (Feet): 60 Feet(3 bouts x 20') Assistive device: Rolling walker (2 wheeled)   Gait velocity: decresed   General Gait Details: cues for hand placement and proximity to RW, pt able to maintain NWB status on Lt LE but easily fatigued ambulating with short distance. Pt required slight assist for steadying/safety  Stairs            Wheelchair Mobility    Modified Rankin (Stroke Patients Only)       Balance Overall balance assessment: Needs assistance Sitting-balance support: Feet supported Sitting balance-Leahy Scale: Good     Standing balance support: Bilateral upper extremity supported;During functional activity Standing balance-Leahy Scale: Fair                    Pertinent Vitals/Pain Pain Assessment: Faces Faces Pain Scale: Hurts a little bit Pain Location: Lt foot Pain Descriptors / Indicators: Pounding Pain Intervention(s): Limited activity within patient's tolerance;Monitored during session;Repositioned    Home Living Family/patient expects to be discharged to:: Private residence Living Arrangements: Alone Available Help at Discharge: Available PRN/intermittently;Friend(s)(pt's friend Grayland Ormond can assis intermittely) Type of Home: Mobile home Home Access: Stairs to enter Entrance Stairs-Rails: None Entrance Stairs-Number of Steps: 2 Home Layout: One level Home Equipment: Cane - single point;Walker - 2 wheels  Additional Comments: pt's friend Grayland Ormond can assist him as needed    Prior Function Level of Independence: Independent with assistive device(s)         Comments:  uses SPC     Hand Dominance   Dominant Hand: Right    Extremity/Trunk Assessment   Upper Extremity Assessment Upper Extremity Assessment: Overall WFL for tasks assessed    Lower Extremity Assessment Lower Extremity Assessment: Overall WFL for tasks assessed    Cervical / Trunk Assessment Cervical / Trunk Assessment: Normal  Communication   Communication: No difficulties  Cognition Arousal/Alertness: Awake/alert Behavior During Therapy: WFL for tasks assessed/performed Overall Cognitive Status: Within Functional Limits for tasks assessed                 General Comments      Exercises     Assessment/Plan    PT Assessment Patient needs continued PT services  PT Problem List Decreased strength;Decreased activity tolerance;Decreased mobility;Decreased knowledge of use of DME       PT Treatment Interventions DME instruction;Gait training;Functional mobility training;Therapeutic activities;Patient/family education;Therapeutic exercise    PT Goals (Current goals can be found in the Care Plan section)  Acute Rehab PT Goals Patient Stated Goal: to get home PT Goal Formulation: With patient Time For Goal Achievement: 11/21/19 Potential to Achieve Goals: Good    Frequency Min 3X/week    AM-PAC PT "6 Clicks" Mobility  Outcome Measure Help needed turning from your back to your side while in a flat bed without using bedrails?: None Help needed moving from lying on your back to sitting on the side of a flat bed without using bedrails?: None Help needed moving to and from a bed to a chair (including a wheelchair)?: A Little Help needed standing up from a chair using your arms (e.g., wheelchair or bedside chair)?: A Little Help needed to walk in hospital room?: A Little Help needed climbing 3-5 steps with a railing? : A Little 6 Click Score: 20    End of Session Equipment Utilized During Treatment: Gait belt Activity Tolerance: Patient tolerated treatment  well Patient left: in chair;with call bell/phone within reach;with chair alarm set Nurse Communication: Mobility status PT Visit Diagnosis: Difficulty in walking, not elsewhere classified (R26.2)    Time: [id number] PT Time Calculation (min) (ACUTE ONLY): 24 min   Charges:   PT Evaluation $PT Eval Low Complexity: 1 Low PT Treatments $Gait Training: 8-22 mins       Verner Mould, DPT Physical Therapist with [facility] [phone]  11/14/2019 4:38 PM

## 2019-11-14 NOTE — Progress Notes (Signed)
PROGRESS NOTE    Eugene Diaz  [id number] DOB: 09/26/1966 DOA: 11/10/2019 PCP: [name], PA-C   Brief Narrative: Patient is a 54 year old male with history of diabetes type 2 with severe peripheral neuropathy, left diabetic foot infection, status post left foot debridement with left partial second ray resection with antibiotic bead placement on 10/06/2019, hypertension, alcohol dependence, marijuana abuse, anemia of chronic disease who presented with suspected left foot infection.  He was sent by his podiatrist [name] for further evaluation of possible left foot with gangrene/leg cellulitis.  Started on antibiotics.  He underwent amputation of left third toe, amputation of left third metatarsal by [name] on 11/11/2019. Underwent   midfoot  amputation  on 11/13/19. Currently hemodynamically stable.  Waiting for Dr Eleanora Neighbor ecommendation for discharge planning.  Assessment & Plan:   Principal Problem:   Acute osteomyelitis of toe of left foot (HCC) Active Problems:   Hypertension   ETOH abuse   Diabetic neuropathy (HCC)   Diabetic infection of left foot (HCC)   Osteomyelitis of foot (HCC)   Wound dehiscence   Anemia, chronic disease   Acute osteomyelitis of left foot (HCC)   Marijuana use   Left foot osteomyelitis: Continue Zosyn for now.  Wound culture showing gram-positive cocci in pairs, Pseudomonas sensitive to Zosyn.  Currently hemodynamically stable.  He underwent amputation of left third toe, amputation of left third metatarsal by [name] on 11/11/2019. Underwent  mid foot amputation by [name] on 11/13/19. Since he had amputation, we can probably discharge him on Augmentin when he is cleared from podiatry. I have requested PT evaluation.  Diabetes type 2: Controlled.  Hemoglobin A1c 5.3.  Continue current regimen.  He was on oral antihyperglycemics at home.  Diabetic neuropathy: On Neurontin  Anemia of chronic disease: Currently H&H stable. Hb in the range of  7 today .continue to monitor.  Check CBC tomorrow  Hypertension: Bp better  this morning.  Continue current medicines  Hyperlipidemia: Continue Lipitor  Chronic alcohol abuse: Started on thiamine and folic acid.  Obesity: BMI of 34.         DVT prophylaxis: Lovenox Code Status: Full Family Communication: None present the bedside Disposition Plan: Home after podiatry clearance.  Pending PT evaluation.  Likely discharge tomorrow.   Consultants: Podiatry  Procedures: As above  Antimicrobials:  Anti-infectives (From admission, onward)   Start     Dose/Rate Route Frequency Ordered Stop   11/13/19 1702  vancomycin (VANCOCIN) powder  Status:  Discontinued       As needed 11/13/19 1702 11/13/19 1910   11/13/19 1630  gentamicin (GARAMYCIN) injection 40 mg     40 mg Intramuscular  Once 11/13/19 1629 11/13/19 1653   11/11/19 1749  vancomycin (VANCOCIN) powder  Status:  Discontinued       As needed 11/11/19 1749 11/11/19 1855   11/11/19 0400  vancomycin (VANCOREADY) IVPB 1250 mg/250 mL  Status:  Discontinued     1,250 mg 166.7 mL/hr over 90 Minutes Intravenous Every 12 hours 11/10/19 1621 11/11/19 0811   11/10/19 1615  vancomycin (VANCOREADY) IVPB 1500 mg/300 mL     1,500 mg 150 mL/hr over 120 Minutes Intravenous STAT 11/10/19 1607 11/10/19 1850   11/10/19 1600  piperacillin-tazobactam (ZOSYN) IVPB 3.375 g  Status:  Discontinued     3.375 g 100 mL/hr over 30 Minutes Intravenous Every 8 hours 11/10/19 1546 11/10/19 1554   11/10/19 1600  piperacillin-tazobactam (ZOSYN) IVPB 3.375 g     3.375  g 12.5 mL/hr over 240 Minutes Intravenous Every 8 hours 11/10/19 1554     11/10/19 1330  vancomycin (VANCOCIN) IVPB 1000 mg/200 mL premix     1,000 mg 200 mL/hr over 60 Minutes Intravenous  Once 11/10/19 1319 11/10/19 1452      Subjective:  Patient seen and examined at the bedside this morning.  Hemodynamically stable.  Appears comfortable.  Pain well controlled.  Objective: Vitals:    11/14/19 0109 11/14/19 0420 11/14/19 0918 11/14/19 0920  BP: (!) 156/91 (!) 154/88 (!) 158/78 (!) 153/85  Pulse: 80 90  96  Resp: 16 17  16   Temp:  99 F (37.2 C)  99.2 F (37.3 C)  TempSrc:  Oral  Oral  SpO2: 100% 93%  98%  Weight:      Height:        Intake/Output Summary (Last 24 hours) at 11/14/2019 1334 Last data filed at 11/14/2019 1200 Gross per 24 hour  Intake 2949.42 ml  Output 2520 ml  Net 429.42 ml   Filed Weights   11/10/19 1156  Weight: 122.7 kg    Examination:  General exam: Appears calm and comfortable ,Not in distress,obese HEENT:PERRL,Oral mucosa moist, Ear/Nose normal on gross exam Respiratory system: Bilateral equal air entry, normal vesicular breath sounds, no wheezes or crackles  Cardiovascular system: S1 & S2 heard, RRR. No JVD, murmurs, rubs, gallops or clicks. No pedal edema. Gastrointestinal system: Abdomen is nondistended, soft and nontender. No organomegaly or masses felt. Normal bowel sounds heard. Central nervous system: Alert and oriented. No focal neurological deficits. Extremities: No edema, no clubbing ,no cyanosis, left foot wrapped with dressing,wound vac Skin: No rashes, lesions or ulcers,no icterus ,no pallor      Data Reviewed: I have personally reviewed following labs and imaging studies  CBC: Recent Labs  Lab 11/10/19 1350 11/10/19 1655 11/11/19 0326 11/12/19 0338 11/14/19 0349  WBC 6.4 5.6 4.3 6.0 6.9  NEUTROABS 4.5  --   --  3.8 4.5  HGB 9.0* 8.7* 8.6* 8.2* 7.9*  HCT 28.6* 27.5* 28.0* 26.2* 25.6*  MCV 91.1 93.2 91.8 92.6 92.1  PLT 309 267 297 276 [id number]   Basic Metabolic Panel: Recent Labs  Lab 11/10/19 1350 11/10/19 1655 11/11/19 0326 11/12/19 0338  NA 134* 137 139 135  K 4.2 4.2 4.7 3.8  CL 98 99 102 101  CO2 26 28 29 26   GLUCOSE 244* 178* 186* 126*  BUN 13 12 11 10   CREATININE 0.98 0.93 1.18 0.96  CALCIUM 8.9 8.7* 8.9 8.4*  MG  --  1.8  --  1.8  PHOS  --  3.6  --   --    GFR: Estimated Creatinine  Clearance: 122.4 mL/min (by C-G formula based on SCr of 0.96 mg/dL). Liver Function Tests: Recent Labs  Lab 11/10/19 1655 11/12/19 0338  AST 15 13*  ALT 11 9  ALKPHOS 95 77  BILITOT 0.5 0.6  PROT 7.7 7.1  ALBUMIN 2.8* 2.7*   No results for input(s): LIPASE, AMYLASE in the last 168 hours. No results for input(s): AMMONIA in the last 168 hours. Coagulation Profile: Recent Labs  Lab 11/11/19 0326 11/12/19 0338  INR 1.1 1.0   Cardiac Enzymes: No results for input(s): CKTOTAL, CKMB, CKMBINDEX, TROPONINI in the last 168 hours. BNP (last 3 results) No results for input(s): PROBNP in the last 8760 hours. HbA1C: No results for input(s): HGBA1C in the last 72 hours. CBG: Recent Labs  Lab 11/13/19 2010 11/13/19 2308 11/14/19 0416 11/14/19 [id number]  11/14/19 1154  GLUCAP 133* 228* 100* 105* 147*   Lipid Profile: No results for input(s): CHOL, HDL, LDLCALC, TRIG, CHOLHDL, LDLDIRECT in the last 72 hours. Thyroid Function Tests: No results for input(s): TSH, T4TOTAL, FREET4, T3FREE, THYROIDAB in the last 72 hours. Anemia Panel: No results for input(s): VITAMINB12, FOLATE, FERRITIN, TIBC, IRON, RETICCTPCT in the last 72 hours. Sepsis Labs: No results for input(s): PROCALCITON, LATICACIDVEN in the last 168 hours.  Recent Results (from the past 240 hour(s))  SARS CORONAVIRUS 2 (TAT 6-24 HRS) Nasopharyngeal Nasopharyngeal Swab     Status: None   Collection Time: 11/10/19  1:50 PM   Specimen: Nasopharyngeal Swab  Result Value Ref Range Status   SARS Coronavirus 2 NEGATIVE NEGATIVE Final    Comment: (NOTE) SARS-CoV-2 target nucleic acids are NOT DETECTED. The SARS-CoV-2 RNA is generally detectable in upper and lower respiratory specimens during the acute phase of infection. Negative results do not preclude SARS-CoV-2 infection, do not rule out co-infections with other pathogens, and should not be used as the sole basis for treatment or other patient management decisions. Negative  results must be combined with clinical observations, patient history, and epidemiological information. The expected result is Negative. Fact Sheet for Patients: SugarRoll.be Fact Sheet for Healthcare Providers: https://www.woods-mathews.com/ This test is not yet approved or cleared by the [place] FDA and  has been authorized for detection and/or diagnosis of SARS-CoV-2 by FDA under an Emergency Use Authorization (EUA). This EUA will remain  in effect (meaning this test can be used) for the duration of the COVID-19 declaration under Section 56 4(b)(1) of the Act, 21 U.S.C. section 360bbb-3(b)(1), unless the authorization is terminated or revoked sooner. Performed at [facility], [place] [address] [place], [place] [address]   MRSA PCR Screening     Status: None   Collection Time: 11/11/19  3:03 AM   Specimen: Nasal Mucosa; Nasopharyngeal  Result Value Ref Range Status   MRSA by PCR NEGATIVE NEGATIVE Final    Comment:        The GeneXpert MRSA Assay (FDA approved for NASAL specimens only), is one component of a comprehensive MRSA colonization surveillance program. It is not intended to diagnose MRSA infection nor to guide or monitor treatment for MRSA infections. Performed at [facility], [place] [address], [place], [place] [address]   Aerobic/Anaerobic Culture (surgical/deep wound)     Status: None (Preliminary result)   Collection Time: 11/11/19  6:00 PM   Specimen: PATH Soft tissue  Result Value Ref Range Status   Specimen Description   Final    TISSUE Performed at [place] [address] [place], [place] [address]    Special Requests LEFT FOOT  Final   Gram Stain   Final    RARE WBC PRESENT, PREDOMINANTLY PMN FEW GRAM POSITIVE COCCI IN PAIRS    Culture   Final    FEW PSEUDOMONAS AERUGINOSA HOLDING FOR POSSIBLE ANAEROBE Performed at [facility], [place] [address]  [place], [place] [address]    Report Status PENDING  Incomplete   Organism ID, Bacteria PSEUDOMONAS AERUGINOSA  Final      Susceptibility   Pseudomonas aeruginosa - MIC*    CEFTAZIDIME 4 SENSITIVE Sensitive     CIPROFLOXACIN 2 INTERMEDIATE Intermediate     GENTAMICIN <=1 SENSITIVE Sensitive     IMIPENEM 2 SENSITIVE Sensitive     PIP/TAZO 8 SENSITIVE Sensitive     * FEW PSEUDOMONAS AERUGINOSA  Radiology Studies: DG Foot 2 Views Left  Result Date: 11/13/2019 CLINICAL DATA:  Postop EXAM: LEFT FOOT - 2 VIEW COMPARISON:  11/11/2019 FINDINGS: Further amputation within the left foot at the level of the tarsal metatarsal joints. Antibiotic beads noted in the postoperative bed. IMPRESSION: Amputation of all digits at the tarsal metatarsal joints. Electronically Signed   By: Rolm Baptise M.D.   On: 11/13/2019 19:20        Scheduled Meds: . amitriptyline  100 mg Oral QHS  . atorvastatin  40 mg Oral QPM  . docusate sodium  100 mg Oral BID  . enoxaparin (LOVENOX) injection  60 mg Subcutaneous Q24H  . folic acid  1 mg Oral Daily  . gabapentin  1,200 mg Oral QHS  . gabapentin  900 mg Oral BID WC  . glycopyrrolate  1 mg Oral BID  . insulin aspart  0-9 Units Subcutaneous Q4H  . lactobacillus acidophilus & bulgar  1 tablet Oral TID WC  . lisinopril  20 mg Oral Daily  . multivitamin with minerals  1 tablet Oral Daily  . pantoprazole  40 mg Oral Daily  . primidone  50 mg Oral BID  . thiamine  100 mg Oral Daily   Or  . thiamine  100 mg Intravenous Daily   Continuous Infusions: . sodium chloride 10 mL/hr at 11/14/19 0600  . lactated ringers 50 mL/hr at 11/14/19 0600  . piperacillin-tazobactam (ZOSYN)  IV 3.375 g (11/14/19 0918)     LOS: 4 days    Time spent:25 mins. More than 50% of that time was spent in counseling and/or coordination of care.      Shelly Coss, MD [facility] Pager [phone]  If 7PM-7AM, please contact night-coverage [email or web address] Password  [email or web address] 11/14/2019, 1:34 PM

## 2019-11-14 NOTE — Plan of Care (Signed)

## 2019-11-15 DIAGNOSIS — E11621 Type 2 diabetes mellitus with foot ulcer: Secondary | ICD-10-CM

## 2019-11-15 DIAGNOSIS — L97424 Non-pressure chronic ulcer of left heel and midfoot with necrosis of bone: Secondary | ICD-10-CM

## 2019-11-15 LAB — AEROBIC/ANAEROBIC CULTURE W GRAM STAIN (SURGICAL/DEEP WOUND)

## 2019-11-15 LAB — CBC WITH DIFFERENTIAL/PLATELET
Abs Immature Granulocytes: 0.08 10*3/uL — ABNORMAL HIGH (ref 0.00–0.07)
Basophils Absolute: 0 10*3/uL (ref 0.0–0.1)
Basophils Relative: 1 %
Eosinophils Absolute: 0.3 10*3/uL (ref 0.0–0.5)
Eosinophils Relative: 4 %
HCT: 25.6 % — ABNORMAL LOW (ref 39.0–52.0)
Hemoglobin: 8 g/dL — ABNORMAL LOW (ref 13.0–17.0)
Immature Granulocytes: 1 %
Lymphocytes Relative: 22 %
Lymphs Abs: 1.6 10*3/uL (ref 0.7–4.0)
MCH: 28.8 pg (ref 26.0–34.0)
MCHC: 31.3 g/dL (ref 30.0–36.0)
MCV: 92.1 fL (ref 80.0–100.0)
Monocytes Absolute: 0.7 10*3/uL (ref 0.1–1.0)
Monocytes Relative: 9 %
Neutro Abs: 4.7 10*3/uL (ref 1.7–7.7)
Neutrophils Relative %: 63 %
Platelets: 300 10*3/uL (ref 150–400)
RBC: 2.78 MIL/uL — ABNORMAL LOW (ref 4.22–5.81)
RDW: 14.1 % (ref 11.5–15.5)
WBC: 7.4 10*3/uL (ref 4.0–10.5)
nRBC: 0 % (ref 0.0–0.2)

## 2019-11-15 LAB — GLUCOSE, CAPILLARY
Glucose-Capillary: 130 mg/dL — ABNORMAL HIGH (ref 70–99)
Glucose-Capillary: 134 mg/dL — ABNORMAL HIGH (ref 70–99)
Glucose-Capillary: 158 mg/dL — ABNORMAL HIGH (ref 70–99)
Glucose-Capillary: 175 mg/dL — ABNORMAL HIGH (ref 70–99)

## 2019-11-15 LAB — SURGICAL PATHOLOGY

## 2019-11-15 MED ORDER — AMOXICILLIN-POT CLAVULANATE 875-125 MG PO TABS: 1.0000 | ORAL_TABLET | Freq: Two times a day (BID) | ORAL | 0 refills | Status: AC

## 2019-11-15 MED ORDER — FERROUS SULFATE 325 (65 FE) MG PO TABS: 325.0000 mg | ORAL_TABLET | Freq: Every day | ORAL | 1 refills | Status: DC

## 2019-11-15 NOTE — Discharge Summary (Signed)
Physician Discharge Summary  Eugene Diaz:[id number] DOB: [birth year] DOA: 11/10/2019  PCP: Eugene Munch, PA-C  Admit date: 11/10/2019 Discharge date: 11/15/2019  Admitted From: Home Disposition:  Home  Discharge Condition:Stable CODE STATUS:FULL Diet recommendation: Heart Healthy  Brief/Interim Summary:  Patient is a 54 year old male with history of diabetes type 2 with severe peripheral neuropathy, left diabetic foot infection, status post left foot debridement with left partial second ray resection with antibiotic bead placement on 10/06/2019, hypertension, alcohol dependence, marijuana abuse, anemia of chronic disease who presented with suspected left foot infection.  He was sent by his podiatrist [name] for further evaluation of possible left foot with gangrene/leg cellulitis.  Started on antibiotics.  He underwent amputation of left third toe, amputation of left third metatarsal by [name] on 11/11/2019. Underwent   midfoot  amputation  on 11/13/19. Currently hemodynamically stable.    Stable  for discharge today to home with home health.  Following problems were addressed during his hospitalization:  Left foot osteomyelitis: .  Wound culture showing gram-positive cocci in pairs, Pseudomonas sensitive to Zosyn.  Currently hemodynamically stable.  He underwent amputation of left third toe, amputation of left third metatarsal by [name] on 11/11/2019. Underwent  mid foot amputation by [name] on 11/13/19. Changed antibiotics to oral, Augmentin.  His osteomyelitis has been surgically treated. PT recommended home health.  Diabetes type 2: Controlled.  Hemoglobin A1c 5.3.  Continue current regimen.  He was on oral antihyperglycemics at home.  Diabetic neuropathy: On Neurontin  Acute on  chronic normocytic anemia:  Most likely  secondary to surgical procedures.  Baseline hemoglobin ranges from 9-10.  He was transfused on his last admission.  Hb in the range of 8 today .  Check  CBC in a week.Started on iron supplementation.  Hypertension:  Continue current medicines  Hyperlipidemia: Continue Lipitor  Chronic alcohol abuse: Counseled for sessation  Obesity: BMI of 34.  Discharge Diagnoses:  Principal Problem:   Acute osteomyelitis of toe of left foot (HCC) Active Problems:   Hypertension   ETOH abuse   Diabetic neuropathy (HCC)   Diabetic infection of left foot (HCC)   Osteomyelitis of foot (HCC)   Wound dehiscence   Anemia, chronic disease   Acute osteomyelitis of left foot ([place])   Marijuana use    Discharge Instructions  Discharge Instructions    Diet - low sodium heart healthy   Complete by: As directed    Discharge instructions   Complete by: As directed    1)Please take prescribed medication as instructed. 2)Follow up with home health. 3)Follow up with Dr [name].Check CBC during the follow-up.   Increase activity slowly   Complete by: As directed      Allergies as of 11/15/2019   No Known Allergies     Medication List    STOP taking these medications   doxycycline 100 MG tablet Commonly known as: VIBRA-TABS     TAKE these medications   Accu-Chek Guide test strip Generic drug: glucose blood USE TO TEST BLOOD SUGARTDAILY AS DIRECTED.   Accu-Chek Guide w/Device Kit USE TO TEST BLOOD SUGARAAS DIRECTED BY DOCTOR.   Accu-Chek Softclix Lancets lancets USE TO TEST BLOOD SUGARADAILY AS DIRECTED.   albuterol 108 (90 Base) MCG/ACT inhaler Commonly known as: VENTOLIN HFA Inhale 2 puffs into the lungs every 6 (six) hours as needed for wheezing or shortness of breath.   amitriptyline 100 MG tablet Commonly known as: ELAVIL Take 100 mg by mouth at  bedtime.   amoxicillin-clavulanate 875-125 MG tablet Commonly known as: Augmentin Take 1 tablet by mouth 2 (two) times daily for 7 days.   atorvastatin 40 MG tablet Commonly known as: LIPITOR Take 40 mg by mouth every evening.   docusate sodium 100 MG capsule Commonly known as:  COLACE Take 100 mg by mouth daily as needed for mild constipation.   ferrous sulfate 325 (65 FE) MG tablet Take 1 tablet (325 mg total) by mouth daily.   furosemide 20 MG tablet Commonly known as: LASIX Take 20 mg by mouth daily.   gabapentin 300 MG capsule Commonly known as: NEURONTIN Take 964m in the morning and at noon, then take 12058mat bedtime What changed:   how much to take  how to take this  when to take this   glimepiride 4 MG tablet Commonly known as: AMARYL Take 4 mg by mouth daily.   glycopyrrolate 1 MG tablet Commonly known as: ROBINUL Take 1 mg by mouth 2 (two) times daily.   HYDROcodone-acetaminophen 5-325 MG tablet Commonly known as: Norco Take 1 tablet by mouth every 6 (six) hours as needed for moderate pain.   hydrOXYzine 25 MG tablet Commonly known as: ATARAX/VISTARIL Take 25 mg by mouth 4 (four) times daily as needed for anxiety.   lidocaine 5 % ointment Commonly known as: XYLOCAINE APPLY TO AFFECTED AREAS TWICE DAILY AS NEEDED. What changed: See the new instructions.   lisinopril 20 MG tablet Commonly known as: ZESTRIL Take 20 mg by mouth daily.   omeprazole 20 MG capsule Commonly known as: PRILOSEC Take 1 capsule (20 mg total) by mouth 2 (two) times daily before a meal. 1 PO 30 mins prior to breakfast and supper What changed: additional instructions   ondansetron 4 MG disintegrating tablet Commonly known as: Zofran ODT Take 1 tablet (4 mg total) by mouth every 8 (eight) hours as needed for nausea or vomiting.   oxycodone 30 MG immediate release tablet Commonly known as: ROXICODONE Take 30 mg by mouth 4 (four) times daily as needed.   primidone 50 MG tablet Commonly known as: MYSOLINE Take 1 tablet (50 mg total) by mouth 2 (two) times daily.   Voltaren 1 % Gel Generic drug: diclofenac Sodium Apply 2 g topically 4 (four) times daily.      Follow-up Information    MaCory MunchPA-C. Schedule an appointment as soon as  possible for a visit in 1 week(s).   Specialties: Physician Assistant, Internal Medicine Contact information: [phone]        No Known Allergies  Consultations:  Podiatry   Procedures/Studies: DG Foot 2 Views Left  Result Date: 11/13/2019 CLINICAL DATA:  Postop EXAM: LEFT FOOT - 2 VIEW COMPARISON:  11/11/2019 FINDINGS: Further amputation within the left foot at the level of the tarsal metatarsal joints. Antibiotic beads noted in the postoperative bed. IMPRESSION: Amputation of all digits at the tarsal metatarsal joints. Electronically Signed   By: KeRolm Baptise.D.   On: 11/13/2019 19:20   DG Foot 2 Views Left  Result Date: 11/11/2019 CLINICAL DATA:  Status post left foot wound irrigation and debridement EXAM: LEFT FOOT - 2 VIEW COMPARISON:  11/08/2019 FINDINGS: Changes of remote transmetatarsal amputation of the 1st and 2nd toes. New transmetatarsal amputation of the 3rd toe. No visible complicating feature. IMPRESSION: As above. Electronically Signed   By: KeRolm Baptise.D.   On: 11/11/2019 18:44   DG Foot 2  Views Left  Result Date: 10/16/2019 CLINICAL DATA:  Left foot surgery EXAM: LEFT FOOT - 2 VIEW COMPARISON:  10/13/2019 FINDINGS: Further interval resection of the first metatarsal with amputation at the proximal metaphysis. Interval second ray resection at the mid to distal second metatarsal diaphysis. There is radiopaque antibiotic bead placement at the resection site. Overlying skin staples. Osseous structures appear otherwise intact. Small bidirectional calcaneal enthesophytes. IMPRESSION: First and second transmetatarsal amputations of the left foot with antibiotic bead placement. Electronically Signed   By: Davina Poke M.D.   On: 10/16/2019 15:47   DG Foot Complete Left  Result Date: 11/08/2019 Please see detailed radiograph report in office note.     Subjective: Patient seen and examined at the bedside  this morning.  Hemodynamically stable for discharge today.  Discharge Exam: Vitals:   11/15/19 0553 11/15/19 0915  BP: 140/77 (!) 150/66  Pulse: 88   Resp: 16   Temp: 98.2 F (36.8 C)   SpO2: 92%    Vitals:   11/14/19 2034 11/14/19 2041 11/15/19 0553 11/15/19 0915  BP: (!) 148/78  140/77 (!) 150/66  Pulse: (!) 109 100 88   Resp: 18  16   Temp: (!) 101.1 F (38.4 C) (!) 100.5 F (38.1 C) 98.2 F (36.8 C)   TempSrc: Oral Oral Oral   SpO2: 92%  92%   Weight:      Height:        General: Pt is alert, awake, not in acute distress Cardiovascular: RRR, S1/S2 +, no rubs, no gallops Respiratory: CTA bilaterally, no wheezing, no rhonchi Abdominal: Soft, NT, ND, bowel sounds + Extremities: no edema, no cyanosis, left foot wrapped with dressings, wound VAC    The results of significant diagnostics from this hospitalization (including imaging, microbiology, ancillary and laboratory) are listed below for reference.     Microbiology: Recent Results (from the past 240 hour(s))  SARS CORONAVIRUS 2 (TAT 6-24 HRS) Nasopharyngeal Nasopharyngeal Swab     Status: None   Collection Time: 11/10/19  1:50 PM   Specimen: Nasopharyngeal Swab  Result Value Ref Range Status   SARS Coronavirus 2 NEGATIVE NEGATIVE Final    Comment: (NOTE) SARS-CoV-2 target nucleic acids are NOT DETECTED. The SARS-CoV-2 RNA is generally detectable in upper and lower respiratory specimens during the acute phase of infection. Negative results do not preclude SARS-CoV-2 infection, do not rule out co-infections with other pathogens, and should not be used as the sole basis for treatment or other patient management decisions. Negative results must be combined with clinical observations, patient history, and epidemiological information. The expected result is Negative. Fact Sheet for Patients: SugarRoll.be Fact Sheet for Healthcare  Providers: https://www.woods-mathews.com/ This test is not yet approved or cleared by the [place] FDA and  has been authorized for detection and/or diagnosis of SARS-CoV-2 by FDA under an Emergency Use Authorization (EUA). This EUA will remain  in effect (meaning this test can be used) for the duration of the COVID-19 declaration under Section 56 4(b)(1) of the Act, 21 U.S.C. section 360bbb-3(b)(1), unless the authorization is terminated or revoked sooner. Performed at [facility], [place] [address] [place], [place] [address]   MRSA PCR Screening     Status: None   Collection Time: 11/11/19  3:03 AM   Specimen: Nasal Mucosa; Nasopharyngeal  Result Value Ref Range Status   MRSA by PCR NEGATIVE NEGATIVE Final    Comment:        The GeneXpert MRSA Assay (FDA approved for  NASAL specimens only), is one component of a comprehensive MRSA colonization surveillance program. It is not intended to diagnose MRSA infection nor to guide or monitor treatment for MRSA infections. Performed at [facility], [place] [address], [place], [place] [address]   Aerobic/Anaerobic Culture (surgical/deep wound)     Status: None   Collection Time: 11/11/19  6:00 PM   Specimen: PATH Soft tissue  Result Value Ref Range Status   Specimen Description   Final    TISSUE Performed at [facility] [address], [place], [place] [address]    Special Requests LEFT FOOT  Final   Gram Stain   Final    RARE WBC PRESENT, PREDOMINANTLY PMN FEW GRAM POSITIVE COCCI IN PAIRS    Culture   Final    FEW PSEUDOMONAS AERUGINOSA ABUNDANT STREPTOCOCCUS MITIS/ORALIS ABUNDANT BACTEROIDES FRAGILIS BETA LACTAMASE POSITIVE Performed at [facility], [place] [address], [place], [place] [address]    Report Status 11/15/2019 FINAL  Final   Organism ID, Bacteria PSEUDOMONAS AERUGINOSA  Final      Susceptibility   Pseudomonas aeruginosa - MIC*    CEFTAZIDIME 4  SENSITIVE Sensitive     CIPROFLOXACIN 2 INTERMEDIATE Intermediate     GENTAMICIN <=1 SENSITIVE Sensitive     IMIPENEM 2 SENSITIVE Sensitive     PIP/TAZO 8 SENSITIVE Sensitive     * FEW PSEUDOMONAS AERUGINOSA     Labs: BNP (last 3 results) Recent Labs    07/15/19 0424 07/16/19 0224 07/17/19 0315  BNP 130.1* 77.2 245.8*   Basic Metabolic Panel: Recent Labs  Lab 11/10/19 1350 11/10/19 1655 11/11/19 0326 11/12/19 0338  NA 134* 137 139 135  K 4.2 4.2 4.7 3.8  CL 98 99 102 101  CO2 _0 GLUCOSE 244* 178* 186* 126*  BUN _1 CREATININE 0.98 0.93 1.18 0.96  CALCIUM 8.9 8.7* 8.9 8.4*  MG  --  1.8  --  1.8  PHOS  --  3.6  --   --    Liver Function Tests: Recent Labs  Lab 11/10/19 1655 11/12/19 0338  AST 15 13*  ALT 11 9  ALKPHOS 95 77  BILITOT 0.5 0.6  PROT 7.7 7.1  ALBUMIN 2.8* 2.7*   No results for input(s): LIPASE, AMYLASE in the last 168 hours. No results for input(s): AMMONIA in the last 168 hours. CBC: Recent Labs  Lab 11/10/19 1350 11/10/19 1350 11/10/19 1655 11/11/19 0326 11/12/19 0338 11/14/19 0349 11/15/19 0358  WBC 6.4   < > 5.6 4.3 6.0 6.9 7.4  NEUTROABS 4.5  --   --   --  3.8 4.5 4.7  HGB 9.0*   < > 8.7* 8.6* 8.2* 7.9* 8.0*  HCT 28.6*   < > 27.5* 28.0* 26.2* 25.6* 25.6*  MCV 91.1   < > 93.2 91.8 92.6 92.1 92.1  PLT 309   < > 267 297 276 328 300   < > = values in this interval not displayed.   Cardiac Enzymes: No results for input(s): CKTOTAL, CKMB, CKMBINDEX, TROPONINI in the last 168 hours. BNP: Invalid input(s): POCBNP CBG: Recent Labs  Lab 11/14/19 1747 11/14/19 2033 11/15/19 0002 11/15/19 0407 11/15/19 0732  GLUCAP 109* 205* 158* 130* 134*   D-Dimer No results for input(s): DDIMER in the last 72 hours. Hgb A1c No results for input(s): HGBA1C in the last 72 hours. Lipid Profile No results for input(s): CHOL, HDL, LDLCALC, TRIG, CHOLHDL, LDLDIRECT in  the last 72 hours. Thyroid function studies No results for  input(s): TSH, T4TOTAL, T3FREE, THYROIDAB in the last 72 hours.  Invalid input(s): FREET3 Anemia work up No results for input(s): VITAMINB12, FOLATE, FERRITIN, TIBC, IRON, RETICCTPCT in the last 72 hours. Urinalysis    Component Value Date/Time   COLORURINE YELLOW 08/14/2019 1232   APPEARANCEUR CLEAR 08/14/2019 1232   LABSPEC 1.020 08/14/2019 1232   PHURINE 5.0 08/14/2019 1232   GLUCOSEU NEGATIVE 08/14/2019 1232   HGBUR NEGATIVE 08/14/2019 [place] 08/14/2019 1232   KETONESUR NEGATIVE 08/14/2019 1232   PROTEINUR NEGATIVE 08/14/2019 1232   UROBILINOGEN 4.0 (H) 10/09/2008 1114   NITRITE NEGATIVE 08/14/2019 1232   LEUKOCYTESUR NEGATIVE 08/14/2019 1232   Sepsis Labs Invalid input(s): PROCALCITONIN,  WBC,  LACTICIDVEN Microbiology Recent Results (from the past 240 hour(s))  SARS CORONAVIRUS 2 (TAT 6-24 HRS) Nasopharyngeal Nasopharyngeal Swab     Status: None   Collection Time: 11/10/19  1:50 PM   Specimen: Nasopharyngeal Swab  Result Value Ref Range Status   SARS Coronavirus 2 NEGATIVE NEGATIVE Final    Comment: (NOTE) SARS-CoV-2 target nucleic acids are NOT DETECTED. The SARS-CoV-2 RNA is generally detectable in upper and lower respiratory specimens during the acute phase of infection. Negative results do not preclude SARS-CoV-2 infection, do not rule out co-infections with other pathogens, and should not be used as the sole basis for treatment or other patient management decisions. Negative results must be combined with clinical observations, patient history, and epidemiological information. The expected result is Negative. Fact Sheet for Patients: SugarRoll.be Fact Sheet for Healthcare Providers: https://www.woods-mathews.com/ This test is not yet approved or cleared by the [place] FDA and  has been authorized for detection and/or diagnosis of SARS-CoV-2 by FDA under an Emergency Use Authorization (EUA). This  EUA will remain  in effect (meaning this test can be used) for the duration of the COVID-19 declaration under Section 56 4(b)(1) of the Act, 21 U.S.C. section 360bbb-3(b)(1), unless the authorization is terminated or revoked sooner. Performed at [facility], [place] [address], [place], [place] [address]   MRSA PCR Screening     Status: None   Collection Time: 11/11/19  3:03 AM   Specimen: Nasal Mucosa; Nasopharyngeal  Result Value Ref Range Status   MRSA by PCR NEGATIVE NEGATIVE Final    Comment:        The GeneXpert MRSA Assay (FDA approved for NASAL specimens only), is one component of a comprehensive MRSA colonization surveillance program. It is not intended to diagnose MRSA infection nor to guide or monitor treatment for MRSA infections. Performed at [facility], [place] [address] [place], [place] [address]   Aerobic/Anaerobic Culture (surgical/deep wound)     Status: None   Collection Time: 11/11/19  6:00 PM   Specimen: PATH Soft tissue  Result Value Ref Range Status   Specimen Description   Final    TISSUE Performed at [facility] [address], [place], [place] [address]    Special Requests LEFT FOOT  Final   Gram Stain   Final    RARE WBC PRESENT, PREDOMINANTLY PMN FEW GRAM POSITIVE COCCI IN PAIRS    Culture   Final    FEW PSEUDOMONAS AERUGINOSA ABUNDANT STREPTOCOCCUS MITIS/ORALIS ABUNDANT BACTEROIDES FRAGILIS BETA LACTAMASE POSITIVE Performed at [facility], [place] [address], [place], [place] [address]    Report Status 11/15/2019 FINAL  Final   Organism ID, Bacteria PSEUDOMONAS AERUGINOSA  Final  Susceptibility   Pseudomonas aeruginosa - MIC*    CEFTAZIDIME 4 SENSITIVE Sensitive     CIPROFLOXACIN 2 INTERMEDIATE Intermediate     GENTAMICIN <=1 SENSITIVE Sensitive     IMIPENEM 2 SENSITIVE Sensitive     PIP/TAZO 8 SENSITIVE Sensitive     * FEW PSEUDOMONAS AERUGINOSA    Please note: You were cared for  by a hospitalist during your hospital stay. Once you are discharged, your primary care physician will handle any further medical issues. Please note that NO REFILLS for any discharge medications will be authorized once you are discharged, as it is imperative that you return to your primary care physician (or establish a relationship with a primary care physician if you do not have one) for your post hospital discharge needs so that they can reassess your need for medications and monitor your lab values.    Time coordinating discharge: 40 minutes  SIGNED:   Shelly Coss, MD  [facility] 11/15/2019, 11:22 AM Pager [phone]  If 7PM-7AM, please contact night-coverage [email or web address] Password [email or web address]

## 2019-11-15 NOTE — Progress Notes (Signed)
Physical Therapy Treatment Patient Details Name: Eugene Diaz MRN: [id number] DOB: [birth year] Today's Date: 11/15/2019    History of Present Illness Patient is a 54 year old male with history of diabetes type 2 with severe peripheral neuropathy, left diabetic foot infection, status post left foot debridement with left partial second ray resection with antibiotic bead placement on 10/06/2019, hypertension, alcohol dependence, marijuana abuse, anemia of chronic disease who presented with suspected left foot infection.  He was sent by his podiatrist [name] for further evaluation of possible left foot with gangrene/leg cellulitis.  Started on antibiotics.  He underwent amputation of left third toe, amputation of left third metatarsal by [name] on 11/11/2019. Underwent   midfoot  amputation  on 11/13/19.    PT Comments    Pt with much improved stair navigation this session, use of axillary crutches for stair training as pt unable to perform with RW. PT encouraged RW use for stability for ambulation. PT stressed the importance of use of gait belt, posterolateral positioning of pt's friend with contact guard during stair navigation upon d/c, and taking his time to avoid LOB. Pt states he is confident in stair training and did not wish to practice further, requires min assist but per pt his friend is strong and will be able to assist well. Pt with no further PT needs in acute setting, Pt plans to ask about HHPT on Monday at follow-up appointment. Pt d/cing with assist of his mother and friend.   Follow Up Recommendations  Home health PT     Equipment Recommendations  None recommended by PT    Recommendations for Other Services       Precautions / Restrictions Precautions Precautions: Fall Restrictions Weight Bearing Restrictions: Yes LLE Weight Bearing: Non weight bearing Other Position/Activity Restrictions: per secure chat with [name], pt should be NWB on Lt LE    Mobility  Bed  Mobility Overal bed mobility: Modified Independent Bed Mobility: Supine to Sit     Supine to sit: Modified independent (Device/Increase time);HOB elevated     General bed mobility comments: increased time and effort  Transfers Overall transfer level: Needs assistance Equipment used: Rolling walker (2 wheeled) Transfers: Sit to/from Stand Sit to Stand: Min guard         General transfer comment: min guard for safety, pt able to rise and self-steady using RW.  Ambulation/Gait Ambulation/Gait assistance: Min guard Gait Distance (Feet): 40 Feet Assistive device: Rolling walker (2 wheeled) Gait Pattern/deviations: Decreased stance time - left;Decreased stride length;Decreased weight shift to left;Step-to pattern Gait velocity: decr   General Gait Details: min guard for safety, with hop-to gait. seated rest break after stair training   Stairs Stairs: Yes Stairs assistance: +2 safety/equipment;Min assist Stair Management: No rails;Forwards;With crutches Number of Stairs: 1 General stair comments: min assist for steadying, sequencing with crutches. PT first ensured pt had proper "hop" in place to clear step, much better this session vs am session. Pt ascended/descending one curb-like step, which is what pt described step as. After stair training, pt states, "well, I actually have one more little step, but it's not as steep as this and my friend will help me". Pt declined practicing 2 steps in a row even with PT encouragement.   Wheelchair Mobility    Modified Rankin (Stroke Patients Only)       Balance Overall balance assessment: Needs assistance Sitting-balance support: Feet supported Sitting balance-Leahy Scale: Good     Standing balance support: Bilateral upper extremity supported;During functional  activity Standing balance-Leahy Scale: Poor Standing balance comment: requires external assist in standing                            Cognition  Arousal/Alertness: Awake/alert Behavior During Therapy: WFL for tasks assessed/performed Overall Cognitive Status: Within Functional Limits for tasks assessed                                        Exercises      General Comments        Pertinent Vitals/Pain Pain Assessment: Faces Faces Pain Scale: Hurts little more Pain Location: Lt foot Pain Descriptors / Indicators: Discomfort Pain Intervention(s): Monitored during session;Repositioned;Limited activity within patient's tolerance    Home Living                      Prior Function            PT Goals (current goals can now be found in the care plan section) Acute Rehab PT Goals Patient Stated Goal: to get home PT Goal Formulation: With patient Time For Goal Achievement: 11/21/19 Potential to Achieve Goals: Good Progress towards PT goals: Progressing toward goals    Frequency    Min 3X/week      PT Plan Current plan remains appropriate    Co-evaluation              AM-PAC PT "6 Clicks" Mobility   Outcome Measure  Help needed turning from your back to your side while in a flat bed without using bedrails?: A Little Help needed moving from lying on your back to sitting on the side of a flat bed without using bedrails?: A Little Help needed moving to and from a bed to a chair (including a wheelchair)?: A Little Help needed standing up from a chair using your arms (e.g., wheelchair or bedside chair)?: A Little Help needed to walk in hospital room?: A Little Help needed climbing 3-5 steps with a railing? : A Lot 6 Click Score: 17    End of Session Equipment Utilized During Treatment: Gait belt Activity Tolerance: Patient limited by fatigue Patient left: with call bell/phone within reach;in bed;with bed alarm set Nurse Communication: Mobility status PT Visit Diagnosis: Difficulty in walking, not elsewhere classified (R26.2)     Time: 1345-1400 PT Time Calculation (min) (ACUTE  ONLY): 15 min  Charges:  $Gait Training: 8-22 mins          Mariaelena Cade E, PT [place] Pager [phone]  Office [phone]    Malisa Ruggiero D Elonda Husky 11/15/2019, 4:26 PM

## 2019-11-15 NOTE — Plan of Care (Signed)

## 2019-11-15 NOTE — Progress Notes (Signed)
Physical Therapy Treatment Patient Details Name: Eugene Diaz MRN: [id number] DOB: Aug 31, 1966 Today's Date: 11/15/2019    History of Present Illness Patient is a 54 year old male with history of diabetes type 2 with severe peripheral neuropathy, left diabetic foot infection, status post left foot debridement with left partial second ray resection with antibiotic bead placement on 10/06/2019, hypertension, alcohol dependence, marijuana abuse, anemia of chronic disease who presented with suspected left foot infection.  He was sent by his podiatrist [name] for further evaluation of possible left foot with gangrene/leg cellulitis.  Started on antibiotics.  He underwent amputation of left third toe, amputation of left third metatarsal by [name] on 11/11/2019. Underwent   midfoot  amputation  on 11/13/19.    PT Comments    Pt ambulating pretty well with use of RW, stair navigation proves to be a very difficult task for pt. Stairs attempted with RW and with axillary crutches, both options relatively unsafe for pt this session given LE weakness and NWB LLE. PT to see pt for second session to reattempt stairs prior to d/c home.    Follow Up Recommendations  Home health PT     Equipment Recommendations  None recommended by PT    Recommendations for Other Services       Precautions / Restrictions Precautions Precautions: Fall Restrictions Weight Bearing Restrictions: Yes LLE Weight Bearing: Non weight bearing Other Position/Activity Restrictions: per secure chat with [name], pt should be NWB on Lt LE    Mobility  Bed Mobility Overal bed mobility: Modified Independent Bed Mobility: Supine to Sit     Supine to sit: Modified independent (Device/Increase time);HOB elevated     General bed mobility comments: increased time and effort  Transfers Overall transfer level: Needs assistance Equipment used: Rolling walker (2 wheeled) Transfers: Sit to/from Stand Sit to Stand: Min  assist         General transfer comment: min assist for steadying upon standing, verbal cuing for NWB LLE. Sit to stand x3, from bed and recliner x2. practiced with crutches as well for stair navigation, required cuing for hand placement on hand rest of crutches, crutch in R hand with assist from PT for crutch placement in standing.  Ambulation/Gait Ambulation/Gait assistance: Min guard;Min assist Gait Distance (Feet): 55 [id number]) Assistive device: Rolling walker (2 wheeled);Crutches Gait Pattern/deviations: Decreased stance time - left;Decreased stride length;Decreased weight shift to left;Step-to pattern Gait velocity: decr   General Gait Details: min guard with occasional min assist for steadying, gait training with both RW and axillary crutches to see which pt is more steady with and crutches utilized for stair navigation. Verbal cuing for NWB LLE, upright posture.   Stairs Stairs: Yes Stairs assistance: Mod assist;+2 safety/equipment Stair Management: No rails;Forwards;With crutches Number of Stairs: 1 General stair comments: mod assist for steadying, correcting LOB, placement of crutches, elevation of trunk. Pt very unsteady, will reattempt stairs in second session.   Wheelchair Mobility    Modified Rankin (Stroke Patients Only)       Balance Overall balance assessment: Needs assistance Sitting-balance support: Feet supported Sitting balance-Leahy Scale: Good     Standing balance support: Bilateral upper extremity supported;During functional activity Standing balance-Leahy Scale: Poor Standing balance comment: requires external assist in standing                            Cognition Arousal/Alertness: Awake/alert Behavior During Therapy: WFL for tasks assessed/performed Overall Cognitive Status: Within Functional  Limits for tasks assessed                                        Exercises      General Comments         Pertinent Vitals/Pain Pain Assessment: Faces Faces Pain Scale: Hurts a little bit Pain Location: Lt foot Pain Descriptors / Indicators: Discomfort Pain Intervention(s): Limited activity within patient's tolerance;Monitored during session;Repositioned    Home Living                      Prior Function            PT Goals (current goals can now be found in the care plan section) Acute Rehab PT Goals Patient Stated Goal: to get home PT Goal Formulation: With patient Time For Goal Achievement: 11/21/19 Potential to Achieve Goals: Good Progress towards PT goals: Progressing toward goals    Frequency    Min 3X/week      PT Plan Discharge plan needs to be updated    Co-evaluation              AM-PAC PT "6 Clicks" Mobility   Outcome Measure  Help needed turning from your back to your side while in a flat bed without using bedrails?: A Little Help needed moving from lying on your back to sitting on the side of a flat bed without using bedrails?: A Little Help needed moving to and from a bed to a chair (including a wheelchair)?: A Little Help needed standing up from a chair using your arms (e.g., wheelchair or bedside chair)?: A Little Help needed to walk in hospital room?: A Little Help needed climbing 3-5 steps with a railing? : A Lot 6 Click Score: 17    End of Session Equipment Utilized During Treatment: Gait belt Activity Tolerance: Patient limited by fatigue Patient left: with call bell/phone within reach;in bed;with bed alarm set Nurse Communication: Mobility status PT Visit Diagnosis: Difficulty in walking, not elsewhere classified (R26.2)     Time: 1120-1200 PT Time Calculation (min) (ACUTE ONLY): 40 min  Charges:  $Gait Training: 23-37 mins $Therapeutic Activity: 8-22 mins                    Juliana Boling E, PT Acute Rehabilitation Services Pager [phone]  Office [phone]    Mikeya Tomasetti D Ramia Sidney 11/15/2019, 1:32 PM

## 2019-11-15 NOTE — Progress Notes (Addendum)
Subjective:  Patient ID: Eugene Diaz, male    DOB: 07-Sep-1966,  MRN: [id number]  Patient seen bedside. Pain controlled, denies complaints. Worked with PT yesterday. Objective:   Vitals:   11/15/19 0553 11/15/19 0915  BP: 140/77 (!) 150/66  Pulse: 88   Resp: 16   Temp: 98.2 F (36.8 C)   SpO2: 92%    Dressing intact. Wound VAC on and functioning.  Upon removal skin margins viable with intact suture and staple, flaps with good capillary refill. No warmth, erythema. Slight ss drainage. No signs of acute infection.  Results for orders placed or performed during the hospital encounter of 11/10/19  SARS CORONAVIRUS 2 (TAT 6-24 HRS) Nasopharyngeal Nasopharyngeal Swab     Status: None   Collection Time: 11/10/19  1:50 PM   Specimen: Nasopharyngeal Swab  Result Value Ref Range Status   SARS Coronavirus 2 NEGATIVE NEGATIVE Final    Comment: (NOTE) SARS-CoV-2 target nucleic acids are NOT DETECTED. The SARS-CoV-2 RNA is generally detectable in upper and lower respiratory specimens during the acute phase of infection. Negative results do not preclude SARS-CoV-2 infection, do not rule out co-infections with other pathogens, and should not be used as the sole basis for treatment or other patient management decisions. Negative results must be combined with clinical observations, patient history, and epidemiological information. The expected result is Negative. Fact Sheet for Patients: SugarRoll.be Fact Sheet for Healthcare Providers: https://www.woods-mathews.com/ This test is not yet approved or cleared by the [place] FDA and  has been authorized for detection and/or diagnosis of SARS-CoV-2 by FDA under an Emergency Use Authorization (EUA). This EUA will remain  in effect (meaning this test can be used) for the duration of the COVID-19 declaration under Section 56 4(b)(1) of the Act, 21 U.S.C. section 360bbb-3(b)(1), unless the  authorization is terminated or revoked sooner. Performed at [facility], [place] [address] [place], [place] [address]   MRSA PCR Screening     Status: None   Collection Time: 11/11/19  3:03 AM   Specimen: Nasal Mucosa; Nasopharyngeal  Result Value Ref Range Status   MRSA by PCR NEGATIVE NEGATIVE Final    Comment:        The GeneXpert MRSA Assay (FDA approved for NASAL specimens only), is one component of a comprehensive MRSA colonization surveillance program. It is not intended to diagnose MRSA infection nor to guide or monitor treatment for MRSA infections. Performed at [facility], [place] [address] [place], [place] [address]   Aerobic/Anaerobic Culture (surgical/deep wound)     Status: None   Collection Time: 11/11/19  6:00 PM   Specimen: PATH Soft tissue  Result Value Ref Range Status   Specimen Description   Final    TISSUE Performed at [facility] [address], [place], [place] [address]    Special Requests LEFT FOOT  Final   Gram Stain   Final    RARE WBC PRESENT, PREDOMINANTLY PMN FEW GRAM POSITIVE COCCI IN PAIRS    Culture   Final    FEW PSEUDOMONAS AERUGINOSA ABUNDANT STREPTOCOCCUS MITIS/ORALIS ABUNDANT BACTEROIDES FRAGILIS BETA LACTAMASE POSITIVE Performed at [facility], [place] [address] [place], [place] [address]    Report Status 11/15/2019 FINAL  Final   Organism ID, Bacteria PSEUDOMONAS AERUGINOSA  Final      Susceptibility   Pseudomonas aeruginosa - MIC*    CEFTAZIDIME 4 SENSITIVE Sensitive     CIPROFLOXACIN 2 INTERMEDIATE Intermediate     GENTAMICIN <=  1 SENSITIVE Sensitive     IMIPENEM 2 SENSITIVE Sensitive     PIP/TAZO 8 SENSITIVE Sensitive     * FEW PSEUDOMONAS AERUGINOSA    Assessment & Plan:  Patient was evaluated and treated and all questions answered.  Left Foot osteomyelitis -Micro reviewed. Multiple organisms. -Appreciate ID input for Home abx reccs. I do believe surgical cure of OM  given full disarticulation of affected bones. -Wound VAC on and functioning. Changed as below. Wound edges viable. -Not able to obtain [facility] for patient. Will have patient come on Monday for wound VAC change.  Procedure: Wound VAC Application Location: left distal foot Technique: Purple foam to wound base, followed by adherent dressing. Set to 125 mmHg with good seal noted. Disposition: Patient tolerated procedure well.  Evelina Bucy, DPM  Accessible via secure chat for questions or concerns.

## 2019-11-15 NOTE — TOC Progression Note (Signed)
Transition of Care [facility]) - Progression Note    Patient Details  Name: Eugene Diaz MRN: [id number] Date of Birth: 18-Jan-1966  Transition of [facility]) CM/SW Contact  Purcell Mouton, RN Phone Number: 11/15/2019, 11:33 AM  Clinical Narrative:    [facility] will follow pt at discharge.        Expected Discharge Plan and Services           Expected Discharge Date: 11/15/19                                     Social Determinants of Health (SDOH) Interventions    Readmission Risk Interventions No flowsheet data found.

## 2019-11-18 ENCOUNTER — Other Ambulatory Visit: Payer: Self-pay

## 2019-11-18 ENCOUNTER — Ambulatory Visit (INDEPENDENT_AMBULATORY_CARE_PROVIDER_SITE_OTHER): Payer: Medicaid Other | Admitting: Podiatry

## 2019-11-18 ENCOUNTER — Other Ambulatory Visit: Payer: Self-pay | Admitting: Gastroenterology

## 2019-11-18 ENCOUNTER — Encounter: Payer: Self-pay | Admitting: Podiatry

## 2019-11-18 DIAGNOSIS — Z9889 Other specified postprocedural states: Secondary | ICD-10-CM

## 2019-11-18 NOTE — Progress Notes (Addendum)
  Subjective:  Patient ID: Eugene Diaz, male    DOB: [birth year],  MRN: [id number]  Chief Complaint  Patient presents with  . Routine Post Op    DOS: 11/12/2018 Procedure: Lisfranc Amputation right  54 y.o. male presents with the above complaint. History confirmed with patient. States the wound VAC has been beeping a bit. Today without his walker despite being advised to do so.  Objective:  Physical Exam: no tenderness at the surgical site, local edema noted and calf supple, nontender. Incision: slow healing, slight clear drainage present, slight marginal dusky areas of incision laterally. Flaps warm with good capillary refill. Some maceration    Assessment:   1. Post-operative state     Plan:  Patient was evaluated and treated and all questions answered.  Post-operative State -Discussed importance of NWB as previously directed. Did not bring his walker today. -Dressing applied consisting of betadine, sterile gauze, kerlix and ACE bandage -The flaps do appear viable but he has some dusky areas of the incision that might be slow to heal. Will monitor closely. -Discussed importance of taking abx. States he has been taking them but also states he has to pick them up from the pharmacy so I am unsure that he is actually taking them. Discussed the importance of taking them to prevent recurrence of infection. -NWB with walker  No follow-ups on file.

## 2019-11-19 ENCOUNTER — Telehealth: Payer: Self-pay | Admitting: *Deleted

## 2019-11-19 NOTE — Telephone Encounter (Signed)
Agree with plan. Betadine to incision followed by dry sterile dressing.

## 2019-11-19 NOTE — Telephone Encounter (Signed)
[facility] - Kathlee Nations states she is at pt's house and has no HHC wound orders, pt's foot has oozed through the dressing and the foot is macerated. I reviewed 11/18/2019 dressing at office visit and informed Kathlee Nations to perform daily, and I would inform [name] and call with any additional orders.

## 2019-11-20 NOTE — Telephone Encounter (Signed)
LVM to Kathlee Nations from [facility] to return our phone call to go over [facility] wound orders from [name].

## 2019-11-20 NOTE — Telephone Encounter (Signed)
Spoke with Kathlee Nations from [facility] about wound care instructions

## 2019-11-21 ENCOUNTER — Ambulatory Visit: Payer: Medicaid Other | Admitting: Podiatry

## 2019-11-22 ENCOUNTER — Ambulatory Visit (INDEPENDENT_AMBULATORY_CARE_PROVIDER_SITE_OTHER): Payer: Medicaid Other | Admitting: Podiatry

## 2019-11-22 ENCOUNTER — Telehealth: Payer: Self-pay | Admitting: *Deleted

## 2019-11-22 ENCOUNTER — Other Ambulatory Visit: Payer: Self-pay

## 2019-11-22 VITALS — Temp 98.2°F

## 2019-11-22 DIAGNOSIS — Z91199 Patient's noncompliance with other medical treatment and regimen due to unspecified reason: Secondary | ICD-10-CM

## 2019-11-22 DIAGNOSIS — Z9119 Patient's noncompliance with other medical treatment and regimen: Secondary | ICD-10-CM

## 2019-11-22 DIAGNOSIS — T8130XA Disruption of wound, unspecified, initial encounter: Secondary | ICD-10-CM

## 2019-11-22 DIAGNOSIS — Z9889 Other specified postprocedural states: Secondary | ICD-10-CM

## 2019-11-22 NOTE — Telephone Encounter (Signed)
If she emails to [place] her and I can work on them

## 2019-11-22 NOTE — Telephone Encounter (Signed)
Pt's sister, [name] states she would like to know who to email forms for disability and transportation for pt, and she had spoken with [name] before.

## 2019-11-22 NOTE — Progress Notes (Signed)
  Subjective:  Patient ID: Eugene Diaz, male    DOB: 07-10-1966,  MRN: [id number]  Chief Complaint  Patient presents with  . Wound Check    Pt states left foot wound has been draining for 2 days. Pt states he has not been wearing his boot inside his house but does wear it outside. Denies any known injury. Denies fever/chills/nausea/vomiting.   DOS: 11/12/2018 Procedure: Lisfranc Amputation right  54 y.o. male presents with the above complaint. History confirmed with patient. Patient admits to [facility] on the extremity despite NWB precautions.  Objective:  Physical Exam: no tenderness at the surgical site, local edema noted and calf supple, nontender. Incision: no significant erythema, slow healing, slight clear drainage present     Assessment:   1. Post-operative state   2. Wound dehiscence   3. Non-compliance    Plan:  Patient was evaluated and treated and all questions answered.  Post-operative State -Wound healing a little slower than expected given WB despite recommendations, however no signs of infection noted. -Dressing applied consisting of betadine, sterile gauze, kerlix and ACE bandage -NWB with crutches, CAM boot -Discussed should he continue weightbearing without the boot we can consider Total contact cast to promote compliance.  Return in about 1 week (around 11/29/2019) for Post-Op (No XRs), Wound Care, Left.

## 2019-11-28 ENCOUNTER — Ambulatory Visit (INDEPENDENT_AMBULATORY_CARE_PROVIDER_SITE_OTHER): Payer: Medicaid Other | Admitting: Podiatry

## 2019-11-28 ENCOUNTER — Telehealth: Payer: Self-pay | Admitting: *Deleted

## 2019-11-28 ENCOUNTER — Other Ambulatory Visit: Payer: Self-pay

## 2019-11-28 DIAGNOSIS — Z9889 Other specified postprocedural states: Secondary | ICD-10-CM

## 2019-11-28 NOTE — Telephone Encounter (Signed)
[facility] - Katherine Basset states at last appt pt is not changing his dressing and is completely soaked through, walking on the foot without the crutches, not checking his sugars or taking his medication, it is still in the bubble wrap the pharmacy sent, and at her 10:30am visit he was drinking and had a trash can full of beer.

## 2019-11-28 NOTE — Telephone Encounter (Signed)
Noted thanks °

## 2019-11-29 ENCOUNTER — Telehealth: Payer: Self-pay | Admitting: Podiatry

## 2019-11-29 NOTE — Telephone Encounter (Signed)
Nurse from [facility] calling to follow up from patients appts yesterday and see if there are any changes to pts home health orders before she goes to see the patient today.  Please give her a call.

## 2019-11-29 NOTE — Telephone Encounter (Addendum)
Left message informing Maudie Mercury - [facility] of Dr. Eleanora Neighbor 11/29/2019 11:54am, changed 3 times a week.

## 2019-11-29 NOTE — Telephone Encounter (Signed)
Betadine to the incision, followed by multilayer compression dresing with [employer] [facility]

## 2019-12-02 NOTE — Telephone Encounter (Signed)
Yes please

## 2019-12-03 ENCOUNTER — Encounter: Payer: Self-pay | Admitting: Gastroenterology

## 2019-12-05 ENCOUNTER — Ambulatory Visit: Payer: Medicaid Other | Admitting: Podiatry

## 2019-12-12 ENCOUNTER — Ambulatory Visit: Payer: Medicaid Other | Admitting: Podiatry

## 2019-12-17 ENCOUNTER — Telehealth: Payer: Self-pay

## 2019-12-17 ENCOUNTER — Encounter (HOSPITAL_COMMUNITY): Payer: Self-pay

## 2019-12-17 ENCOUNTER — Emergency Department (HOSPITAL_COMMUNITY): Payer: Medicaid Other

## 2019-12-17 ENCOUNTER — Other Ambulatory Visit: Payer: Self-pay

## 2019-12-17 ENCOUNTER — Inpatient Hospital Stay (HOSPITAL_COMMUNITY)
Admission: EM | Admit: 2019-12-17 | Discharge: 2019-12-27 | DRG: 475 | Disposition: A | Discharge status: Skilled Nursing Facility | Payer: Medicaid Other | Attending: Internal Medicine | Admitting: Internal Medicine

## 2019-12-17 DIAGNOSIS — E119 Type 2 diabetes mellitus without complications: Secondary | ICD-10-CM | POA: Diagnosis not present

## 2019-12-17 DIAGNOSIS — E7800 Pure hypercholesterolemia, unspecified: Secondary | ICD-10-CM | POA: Diagnosis present

## 2019-12-17 DIAGNOSIS — E1159 Type 2 diabetes mellitus with other circulatory complications: Secondary | ICD-10-CM | POA: Diagnosis present

## 2019-12-17 DIAGNOSIS — T8781 Dehiscence of amputation stump: Secondary | ICD-10-CM | POA: Diagnosis present

## 2019-12-17 DIAGNOSIS — Z7984 Long term (current) use of oral hypoglycemic drugs: Secondary | ICD-10-CM

## 2019-12-17 DIAGNOSIS — R739 Hyperglycemia, unspecified: Secondary | ICD-10-CM

## 2019-12-17 DIAGNOSIS — Z792 Long term (current) use of antibiotics: Secondary | ICD-10-CM | POA: Diagnosis not present

## 2019-12-17 DIAGNOSIS — E1165 Type 2 diabetes mellitus with hyperglycemia: Secondary | ICD-10-CM | POA: Diagnosis present

## 2019-12-17 DIAGNOSIS — M86672 Other chronic osteomyelitis, left ankle and foot: Secondary | ICD-10-CM | POA: Diagnosis not present

## 2019-12-17 DIAGNOSIS — E1169 Type 2 diabetes mellitus with other specified complication: Secondary | ICD-10-CM | POA: Diagnosis present

## 2019-12-17 DIAGNOSIS — Z79899 Other long term (current) drug therapy: Secondary | ICD-10-CM

## 2019-12-17 DIAGNOSIS — E1141 Type 2 diabetes mellitus with diabetic mononeuropathy: Secondary | ICD-10-CM | POA: Diagnosis not present

## 2019-12-17 DIAGNOSIS — Z87891 Personal history of nicotine dependence: Secondary | ICD-10-CM

## 2019-12-17 DIAGNOSIS — Z8249 Family history of ischemic heart disease and other diseases of the circulatory system: Secondary | ICD-10-CM

## 2019-12-17 DIAGNOSIS — Z9889 Other specified postprocedural states: Secondary | ICD-10-CM

## 2019-12-17 DIAGNOSIS — I152 Hypertension secondary to endocrine disorders: Secondary | ICD-10-CM | POA: Diagnosis present

## 2019-12-17 DIAGNOSIS — F329 Major depressive disorder, single episode, unspecified: Secondary | ICD-10-CM | POA: Diagnosis present

## 2019-12-17 DIAGNOSIS — B9561 Methicillin susceptible Staphylococcus aureus infection as the cause of diseases classified elsewhere: Secondary | ICD-10-CM | POA: Diagnosis present

## 2019-12-17 DIAGNOSIS — Z20822 Contact with and (suspected) exposure to covid-19: Secondary | ICD-10-CM | POA: Diagnosis present

## 2019-12-17 DIAGNOSIS — Z6834 Body mass index (BMI) 34.0-34.9, adult: Secondary | ICD-10-CM

## 2019-12-17 DIAGNOSIS — Y835 Amputation of limb(s) as the cause of abnormal reaction of the patient, or of later complication, without mention of misadventure at the time of the procedure: Secondary | ICD-10-CM | POA: Diagnosis present

## 2019-12-17 DIAGNOSIS — E11628 Type 2 diabetes mellitus with other skin complications: Secondary | ICD-10-CM | POA: Diagnosis not present

## 2019-12-17 DIAGNOSIS — E0841 Diabetes mellitus due to underlying condition with diabetic mononeuropathy: Secondary | ICD-10-CM | POA: Diagnosis not present

## 2019-12-17 DIAGNOSIS — I10 Essential (primary) hypertension: Secondary | ICD-10-CM | POA: Diagnosis present

## 2019-12-17 DIAGNOSIS — M86172 Other acute osteomyelitis, left ankle and foot: Secondary | ICD-10-CM | POA: Diagnosis present

## 2019-12-17 DIAGNOSIS — Z89432 Acquired absence of left foot: Secondary | ICD-10-CM | POA: Diagnosis not present

## 2019-12-17 DIAGNOSIS — Y92009 Unspecified place in unspecified non-institutional (private) residence as the place of occurrence of the external cause: Secondary | ICD-10-CM

## 2019-12-17 DIAGNOSIS — E1142 Type 2 diabetes mellitus with diabetic polyneuropathy: Secondary | ICD-10-CM | POA: Diagnosis present

## 2019-12-17 DIAGNOSIS — E785 Hyperlipidemia, unspecified: Secondary | ICD-10-CM | POA: Diagnosis present

## 2019-12-17 DIAGNOSIS — L03116 Cellulitis of left lower limb: Secondary | ICD-10-CM

## 2019-12-17 DIAGNOSIS — L089 Local infection of the skin and subcutaneous tissue, unspecified: Secondary | ICD-10-CM | POA: Diagnosis not present

## 2019-12-17 DIAGNOSIS — E669 Obesity, unspecified: Secondary | ICD-10-CM | POA: Diagnosis present

## 2019-12-17 DIAGNOSIS — K219 Gastro-esophageal reflux disease without esophagitis: Secondary | ICD-10-CM | POA: Diagnosis present

## 2019-12-17 DIAGNOSIS — Z9119 Patient's noncompliance with other medical treatment and regimen: Secondary | ICD-10-CM | POA: Diagnosis not present

## 2019-12-17 DIAGNOSIS — F419 Anxiety disorder, unspecified: Secondary | ICD-10-CM | POA: Diagnosis present

## 2019-12-17 DIAGNOSIS — B952 Enterococcus as the cause of diseases classified elsewhere: Secondary | ICD-10-CM | POA: Diagnosis present

## 2019-12-17 DIAGNOSIS — D638 Anemia in other chronic diseases classified elsewhere: Secondary | ICD-10-CM

## 2019-12-17 DIAGNOSIS — E1140 Type 2 diabetes mellitus with diabetic neuropathy, unspecified: Secondary | ICD-10-CM | POA: Diagnosis present

## 2019-12-17 LAB — COMPREHENSIVE METABOLIC PANEL
ALT: 11 U/L (ref 0–44)
AST: 17 U/L (ref 15–41)
Albumin: 3.5 g/dL (ref 3.5–5.0)
Alkaline Phosphatase: 102 U/L (ref 38–126)
Anion gap: 7 (ref 5–15)
BUN: 21 mg/dL — ABNORMAL HIGH (ref 6–20)
CO2: 28 mmol/L (ref 22–32)
Calcium: 8.9 mg/dL (ref 8.9–10.3)
Chloride: 101 mmol/L (ref 98–111)
Creatinine, Ser: 0.92 mg/dL (ref 0.61–1.24)
GFR calc Af Amer: >60 mL/min (ref >60)
GFR calc non Af Amer: >60 mL/min (ref >60)
Glucose, Bld: 169 mg/dL — ABNORMAL HIGH (ref 70–99)
Potassium: 4 mmol/L (ref 3.5–5.1)
Sodium: 136 mmol/L (ref 135–145)
Total Bilirubin: 0.9 mg/dL (ref 0.3–1.2)
Total Protein: 7.5 g/dL (ref 6.5–8.1)

## 2019-12-17 LAB — CBC WITH DIFFERENTIAL/PLATELET
Abs Immature Granulocytes: 0.05 10*3/uL (ref 0.00–0.07)
Basophils Absolute: 0.1 10*3/uL (ref 0.0–0.1)
Basophils Relative: 1 %
Eosinophils Absolute: 0.3 10*3/uL (ref 0.0–0.5)
Eosinophils Relative: 6 %
HCT: 31.4 % — ABNORMAL LOW (ref 39.0–52.0)
Hemoglobin: 9.9 g/dL — ABNORMAL LOW (ref 13.0–17.0)
Immature Granulocytes: 1 %
Lymphocytes Relative: 27 %
Lymphs Abs: 1.4 10*3/uL (ref 0.7–4.0)
MCH: 29.6 pg (ref 26.0–34.0)
MCHC: 31.5 g/dL (ref 30.0–36.0)
MCV: 94 fL (ref 80.0–100.0)
Monocytes Absolute: 0.3 10*3/uL (ref 0.1–1.0)
Monocytes Relative: 5 %
Neutro Abs: 3.1 10*3/uL (ref 1.7–7.7)
Neutrophils Relative %: 60 %
Platelets: 185 10*3/uL (ref 150–400)
RBC: 3.34 MIL/uL — ABNORMAL LOW (ref 4.22–5.81)
RDW: 17 % — ABNORMAL HIGH (ref 11.5–15.5)
WBC: 5.3 10*3/uL (ref 4.0–10.5)
nRBC: 0 % (ref 0.0–0.2)

## 2019-12-17 LAB — RESPIRATORY PANEL BY RT PCR (FLU A&B, COVID)
Influenza A by PCR: NEGATIVE
Influenza B by PCR: NEGATIVE
SARS Coronavirus 2 by RT PCR: NEGATIVE

## 2019-12-17 LAB — LACTIC ACID, PLASMA: Lactic Acid, Venous: 1.6 mmol/L (ref 0.5–1.9)

## 2019-12-17 MED ORDER — PIPERACILLIN-TAZOBACTAM 3.375 G IVPB
3.3750 g | Freq: Three times a day (TID) | INTRAVENOUS | Status: DC
  Administered 2019-12-18 – 2019-12-27 (×29): 3.375 g via INTRAVENOUS
  Filled 2019-12-17 (×30): qty 50

## 2019-12-17 MED ORDER — PANTOPRAZOLE SODIUM 40 MG PO TBEC
40.0000 mg | DELAYED_RELEASE_TABLET | Freq: Every day | ORAL | Status: DC
  Administered 2019-12-18 – 2019-12-27 (×10): 40 mg via ORAL
  Filled 2019-12-17 (×10): qty 1

## 2019-12-17 MED ORDER — INSULIN ASPART 100 UNIT/ML ~~LOC~~ SOLN
0.0000 [IU] | Freq: Three times a day (TID) | SUBCUTANEOUS | Status: DC
  Administered 2019-12-18 (×3): 2 [IU] via SUBCUTANEOUS
  Administered 2019-12-19: 1 [IU] via SUBCUTANEOUS
  Administered 2019-12-19: 2 [IU] via SUBCUTANEOUS
  Administered 2019-12-19: 3 [IU] via SUBCUTANEOUS
  Administered 2019-12-20: 5 [IU] via SUBCUTANEOUS
  Administered 2019-12-20: 3 [IU] via SUBCUTANEOUS
  Administered 2019-12-20 – 2019-12-21 (×3): 2 [IU] via SUBCUTANEOUS
  Administered 2019-12-22 (×2): 3 [IU] via SUBCUTANEOUS
  Administered 2019-12-22: 2 [IU] via SUBCUTANEOUS
  Administered 2019-12-23: 3 [IU] via SUBCUTANEOUS
  Administered 2019-12-23: 2 [IU] via SUBCUTANEOUS
  Administered 2019-12-23: 5 [IU] via SUBCUTANEOUS
  Administered 2019-12-24: 2 [IU] via SUBCUTANEOUS
  Administered 2019-12-24: 5 [IU] via SUBCUTANEOUS
  Administered 2019-12-24: 2 [IU] via SUBCUTANEOUS
  Administered 2019-12-25 – 2019-12-26 (×5): 3 [IU] via SUBCUTANEOUS
  Administered 2019-12-26: 2 [IU] via SUBCUTANEOUS

## 2019-12-17 MED ORDER — INSULIN ASPART 100 UNIT/ML ~~LOC~~ SOLN
0.0000 [IU] | Freq: Every day | SUBCUTANEOUS | Status: DC
  Administered 2019-12-18: 3 [IU] via SUBCUTANEOUS
  Administered 2019-12-22 – 2019-12-23 (×2): 2 [IU] via SUBCUTANEOUS

## 2019-12-17 MED ORDER — ACETAMINOPHEN 650 MG RE SUPP: 650.0000 mg | Freq: Four times a day (QID) | RECTAL | Status: DC | PRN

## 2019-12-17 MED ORDER — VANCOMYCIN HCL 1500 MG/300ML IV SOLN
1500.0000 mg | Freq: Two times a day (BID) | INTRAVENOUS | Status: DC
  Administered 2019-12-18 – 2019-12-21 (×8): 1500 mg via INTRAVENOUS
  Filled 2019-12-17 (×8): qty 300

## 2019-12-17 MED ORDER — FUROSEMIDE 20 MG PO TABS
20.0000 mg | ORAL_TABLET | Freq: Every day | ORAL | Status: DC
  Administered 2019-12-18 – 2019-12-27 (×10): 20 mg via ORAL
  Filled 2019-12-17 (×10): qty 1

## 2019-12-17 MED ORDER — PIPERACILLIN-TAZOBACTAM 3.375 G IVPB 30 MIN
3.3750 g | Freq: Once | INTRAVENOUS | Status: AC
  Administered 2019-12-17: 3.375 g via INTRAVENOUS
  Filled 2019-12-17: qty 50

## 2019-12-17 MED ORDER — OXYCODONE HCL 5 MG PO TABS
5.0000 mg | ORAL_TABLET | ORAL | Status: DC | PRN
  Administered 2019-12-21 – 2019-12-25 (×3): 5 mg via ORAL
  Filled 2019-12-17 (×3): qty 1

## 2019-12-17 MED ORDER — ATORVASTATIN CALCIUM 40 MG PO TABS
40.0000 mg | ORAL_TABLET | Freq: Every evening | ORAL | Status: DC
  Administered 2019-12-18 – 2019-12-26 (×9): 40 mg via ORAL
  Filled 2019-12-17 (×9): qty 1

## 2019-12-17 MED ORDER — PIPERACILLIN-TAZOBACTAM 3.375 G IVPB: 3.3750 g | Freq: Three times a day (TID) | INTRAVENOUS | Status: DC

## 2019-12-17 MED ORDER — VANCOMYCIN HCL 2000 MG/400ML IV SOLN
2000.0000 mg | Freq: Once | INTRAVENOUS | Status: AC
  Administered 2019-12-17: 2000 mg via INTRAVENOUS
  Filled 2019-12-17: qty 400

## 2019-12-17 MED ORDER — ENOXAPARIN SODIUM 40 MG/0.4ML ~~LOC~~ SOLN: 40.0000 mg | SUBCUTANEOUS | Status: DC

## 2019-12-17 MED ORDER — LISINOPRIL 20 MG PO TABS
20.0000 mg | ORAL_TABLET | Freq: Every day | ORAL | Status: DC
  Administered 2019-12-18 – 2019-12-27 (×10): 20 mg via ORAL
  Filled 2019-12-17 (×10): qty 1

## 2019-12-17 MED ORDER — ACETAMINOPHEN 325 MG PO TABS: 650.0000 mg | ORAL_TABLET | Freq: Four times a day (QID) | ORAL | Status: DC | PRN

## 2019-12-17 MED ORDER — ALBUTEROL SULFATE (2.5 MG/3ML) 0.083% IN NEBU
2.5000 mg | INHALATION_SOLUTION | Freq: Four times a day (QID) | RESPIRATORY_TRACT | Status: DC | PRN
  Administered 2019-12-25: 2.5 mg via RESPIRATORY_TRACT
  Filled 2019-12-17: qty 3

## 2019-12-17 NOTE — Progress Notes (Signed)
Pharmacy Antibiotic Note  Eugene Diaz is a 54 y.o. diabetic  male admitted on 12/17/2019 with osteomyelitis. Patient had left midfoot   Lisfrac ampuation on 12-01-19.  Pharmacy has been consulted for vancomycin and Zosyn  dosing.  Plan: Start Zosyn 3.375g IV q8h (EI)  Loading dose:   vancomycin 2g  IV x1 dose Maintenance dose:  vancomycin 1.5g IV  q12 h Goal vancomycin trough range:    15-20     mcg/mL Pharmacy will continue to monitor renal function, vancomycin troughs as clinically appropriate,  cultures and patient progress.   Weight: 270 lb (122.5 kg)  Temp (24hrs), Avg:98.3 F (36.8 C), Min:98.3 F (36.8 C), Max:98.3 F (36.8 C)  Recent Labs  Lab 12/17/19 1822  WBC 5.3  CREATININE 0.92  LATICACIDVEN 1.6    Estimated Creatinine Clearance: 127.6 mL/min (by C-G formula based on SCr of 0.92 mg/dL).    No Known Allergies  Antimicrobials this admission:  vancomycin 2/16>>   Zosyn >>     Microbiology results: 2/16 BC x2:  2/16 Resp PCR: SARS CoV-2 negative; Flu A/B negative   Thank you for allowing pharmacy to be a part of this patient's care.  Despina Pole 12/17/2019 10:14 PM

## 2019-12-17 NOTE — ED Notes (Signed)
Placed call to Dr. Hardie Pulley at [facility] to call back Dr. Laverta Baltimore at [phone].

## 2019-12-17 NOTE — H&P (Signed)
History and Physical  Eugene Diaz [id number] DOB: 1966-09-07 DOA: 12/17/2019  Referring physician: Nanda Diaz PCP: Eugene Munch, PA-C  Patient coming from: Home  Chief Complaint: Wound infection   HPI: Eugene Diaz is a 54 y.o. male with medical history significant for type II there is mellitus, GERD, peripheral neuropathy, left wound requiring midfoot Lisfranc amputation with Dr. March Diaz on November 13, 2019 presents to the emergency department with concern of wound infection.  Patient has a wound nurse who sees patient on Monday/Wednesday/Friday every other week for wound dressing changes.  He was seen today and a foul-smelling draining discharge with redness extending up the leg was noted, Diaz he was asked to go to the ED for further evaluation.  Patient denies worsening pain in the foot possibly due to neuropathy, he denies chest pain, shortness of breath, fever or chills.  He endorsed some foul smell from left foot.   ED Course:  In the emergency department, BP was elevated at 174/78, but other vital signs were stable.  Work-up in the ED showed normocytic anemia and hyperglycemia.  Left foot x-ray showed midfoot amputation with progressive soft tissue swelling and cortical erosion of the cuneiforms and possibly cuboid bones compatible with osteomyelitis and soft tissue infection.  Patient's orthopedic surgeon (Dr. March Diaz) was consulted by ED PA and recommended starting patient with IV vancomycin and Zosyn with plan to admit patient to hospitalist service at [facility] and that he will see patient in the morning.  Review of Systems: Constitutional: Negative for chills and fever.  HENT: Negative for ear pain and sore throat.   Eyes: Negative for pain and visual disturbance.  Respiratory: Negative for cough, chest tightness and shortness of breath.   Cardiovascular: Negative for chest pain and palpitations.  Gastrointestinal: Negative for abdominal pain and vomiting.  Endocrine:  Negative for polyphagia and polyuria.  Genitourinary: Negative for decreased urine volume, dysuria, enuresis, hematuria Musculoskeletal: Increased foul smell from left foot. Negative for arthralgias and back pain.  Skin: Negative for color change and rash.  Allergic/Immunologic: Negative for immunocompromised state.  Neurological: Negative for tremors, syncope, speech difficulty, weakness, light-headedness and headaches.  Hematological: Does not bruise/bleed easily.  All other systems reviewed and are negative   Past Medical History:  Diagnosis Date  . Alcohol abuse   . Alcoholic peripheral neuropathy ([place])   . Anemia   . Anxiety   . Arthritis   . Asthma    followed by pcp  . B12 deficiency   . Cellulitis of left lower leg    07-2019 and left foot  . Depression   . Diabetic neuropathy ([place])   . Essential tremor    neurologist-- dr patel--- due to alcohol abuse  . GERD (gastroesophageal reflux disease)   . History of esophageal stricture    s/p  dilatation 02-13-2018  . Hypercholesterolemia   . Hypertension    followed by pcp  (nuclear stress test 03-11-2014 low risk w/ no ischemia, ef 65%  . Osteomyelitis of great toe ([place])    left foot s/p amputation 08-14-2019  . Peripheral vascular disease ([place])   . Thrombocytopenia (HCC)    chronic  . Type 2 diabetes mellitus ([place])    followed by pcp--- last A1c in epic 6.6 on 07-12-2019  . Wound dehiscence, surgical    post left great toe amputation 08-14-2019   Past Surgical History:  Procedure Laterality Date  . AMPUTATION Left 10/16/2019   Procedure: Left partial second ray resection; placement  of antibiotic beads;  Surgeon: Eugene Diaz, DPM;  Location: [facility];  Service: Podiatry;  Laterality: Left;  . AMPUTATION Left 11/13/2019   Procedure: Left Midfoot Amputation - Transmetatarsal vs. Lisfranc; Placement antibiotic beads;  Surgeon: Eugene Diaz, DPM;  Location: [facility];  Service: Podiatry;  Laterality: Left;  .  AMPUTATION TOE Left 08/14/2019   Procedure: AMPUTATION GREAT TOE;  Surgeon: Eugene Diaz, DPM;  Location: [facility];  Service: Podiatry;  Laterality: Left;  . BIOPSY  02/13/2018   Procedure: BIOPSY;  Surgeon: Eugene Binder, MD;  [facility];  Service: Endoscopy;;  transverse colon biopsy, gastric biopsy  . COLONOSCOPY WITH PROPOFOL N/A 02/13/2018   Procedure: COLONOSCOPY WITH PROPOFOL;  Surgeon: Eugene Binder, MD;  [facility];  Service: Endoscopy;  Laterality: N/A;  11:15am  . ESOPHAGOGASTRODUODENOSCOPY (EGD) WITH PROPOFOL N/A 02/13/2018   Procedure: ESOPHAGOGASTRODUODENOSCOPY (EGD) WITH PROPOFOL;  Surgeon: Eugene Binder, MD;  [facility];  Service: Endoscopy;  Laterality: N/A;  . I & D EXTREMITY Left 07/16/2019   Procedure: Insicion  AND DEBRIDEMENT EXTREMITY;  Surgeon: Eugene Diaz, DPM;  Location: [place];  Service: Podiatry;  Laterality: Left;  . INCISION AND DRAINAGE OF WOUND Left 10/13/2019   Procedure: IRRIGATION AND DEBRIDEMENT WOUND OF LEFT FOOT AND FIRST METATARSAL RESECTION;  Surgeon: Eugene Diaz, DPM;  Location: [facility];  Service: Podiatry;  Laterality: Left;  . IRRIGATION AND DEBRIDEMENT FOOT Left 11/11/2019   Procedure: Left Foot Wound Irrigation and Debridement;  Surgeon: Eugene Diaz, DPM;  Location: [facility];  Service: Podiatry;  Laterality: Left;  . Left arm     Left arm repair (tendon and artery)  . PERCUTANEOUS PINNING  07/16/2019   Procedure: Open Reduction Percutaneous Pinning Extremity;  Surgeon: Eugene Diaz, DPM;  Location: [place];  Service: Podiatry;;  . PILONIDAL CYST EXCISION N/A 08/08/2014   Procedure: CYST EXCISION PILONIDAL EXTENSIVE;  Surgeon: Eugene So, MD;  Location: [facility];  Service: General;  Laterality: N/A;  . POLYPECTOMY  02/13/2018   Procedure: POLYPECTOMY;  Surgeon: Eugene Binder, MD;  [facility];  Service: Endoscopy;;  transverse colon polyp hs, rectal polyps times 2  . SAVORY DILATION  N/A 02/13/2018   Procedure: SAVORY DILATION;  Surgeon: Eugene Binder, MD;  [facility];  Service: Endoscopy;  Laterality: N/A;  . WOUND DEBRIDEMENT Left 09/04/2019   Procedure: DEBRIDEMENT WOUND WITH COMPLEX  REPAIR OF DEHISCENCE;  Surgeon: Eugene Diaz, DPM;  Location: [place];  Service: Podiatry;  Laterality: Left;  Leave patient on stretcher  . WOUND DEBRIDEMENT Left 10/16/2019   Procedure: Left foot wound debridement and closure;  Surgeon: Eugene Diaz, DPM;  [facility];  Service: Podiatry;  Laterality: Left;    Social History:  reports that he quit smoking about 35 years ago. His smoking use included cigarettes. He has a 62.50 pack-year smoking history. He has never used smokeless tobacco. He reports current alcohol use of about 50.0 standard drinks of alcohol per week. He reports current drug use. Drug: Marijuana.   No Known Allergies  Family History  Problem Relation Age of Onset  . Heart attack Mother        Living, 66  . Cancer Father        Deceased  . Healthy Brother   . Healthy Sister   . Colon cancer Neg Hx   . Colon polyps Neg Hx  Prior to Admission medications   Medication Sig Start Date End Date Taking? Authorizing Provider  albuterol (PROVENTIL HFA;VENTOLIN HFA) 108 (90 Base) MCG/ACT inhaler Inhale 2 puffs into the lungs every 6 (six) hours as needed for wheezing or shortness of breath. 10/31/18  Yes Hayden Rasmussen, MD  ALPRAZolam Duanne Moron) 1 MG tablet Take 1 mg by mouth in the morning, at noon, in the evening, and at bedtime.  12/04/19  Yes [provider]  amitriptyline (ELAVIL) 100 MG tablet Take 100 mg by mouth at bedtime.   Yes [provider]  atorvastatin (LIPITOR) 40 MG tablet Take 40 mg by mouth every evening.  12/17/15  Yes [provider]  docusate sodium (COLACE) 100 MG capsule Take 100 mg by mouth daily as needed for mild constipation.   Yes [provider]  DULoxetine  (CYMBALTA) 60 MG capsule Take 60 mg by mouth daily. 11/18/19  Yes [provider]  ferrous sulfate 325 (65 FE) MG tablet Take 1 tablet (325 mg total) by mouth daily. 11/15/19 11/14/20 Yes Shelly Coss, MD  furosemide (LASIX) 20 MG tablet Take 20 mg by mouth daily.    Yes [provider]  gabapentin (NEURONTIN) 300 MG capsule Take 900mg  in the morning and at noon, then take 1200mg  at bedtime Patient taking differently: Take 900-1,200 mg by mouth See admin instructions. Take 900mg  in the morning and at noon, then take 1200mg  at bedtime 06/14/19  Yes Patel, Donika K, DO  glimepiride (AMARYL) 4 MG tablet Take 4 mg by mouth daily. 08/10/19  Yes [provider]  glycopyrrolate (ROBINUL) 1 MG tablet Take 1 mg by mouth 2 (two) times daily.  12/17/15  Yes [provider]  HYDROcodone-acetaminophen (NORCO) 5-325 MG tablet Take 1 tablet by mouth every 6 (six) hours as needed for moderate pain. 10/18/19  Yes Eugene Diaz, DPM  hydrOXYzine (ATARAX/VISTARIL) 25 MG tablet Take 25 mg by mouth 4 (four) times daily as needed for anxiety.  06/17/19  Yes [provider]  lisinopril (PRINIVIL,ZESTRIL) 20 MG tablet Take 20 mg by mouth daily.  12/30/15  Yes [provider]  omeprazole (PRILOSEC) 20 MG capsule TAKE 1 CAPSULE WITH BREAKFAST AND SUPPER. Patient taking differently: Take 20 mg by mouth 2 (two) times daily before a meal.  11/19/19  Yes Mahala Menghini, PA-C  oxycodone (ROXICODONE) 30 MG immediate release tablet Take 30 mg by mouth 4 (four) times daily as needed for pain.  11/04/19  Yes [provider]  primidone (MYSOLINE) 50 MG tablet Take 1 tablet (50 mg total) by mouth 2 (two) times daily. Patient taking differently: Take 50 mg by mouth 2 (two) times daily.  06/14/19  Yes Patel, Donika K, DO  lidocaine (XYLOCAINE) 5 % ointment APPLY TO AFFECTED AREAS TWICE DAILY AS NEEDED. Patient not taking: No sig reported 04/04/19   Narda Amber K, DO  ondansetron  (ZOFRAN ODT) 4 MG disintegrating tablet Take 1 tablet (4 mg total) by mouth every 8 (eight) hours as needed for nausea or vomiting. Patient not taking: Reported on 12/17/2019 10/18/19   Rai, Vernelle Emerald, MD  VOLTAREN 1 % GEL Apply 2 g topically 4 (four) times daily as needed (for pain).  08/19/15   [provider]    Physical Exam: BP (!) 174/78 (BP Location: Right Arm)   Pulse 88   Temp 98.3 F (36.8 C) (Oral)   SpO2 98%   . General: 54 y.o. year-old male well developed well nourished in no  acute distress.  Alert and oriented x3. Marland Kitchen HEENT: Normocephalic, atraumatic . Neck: Supple, trachea medial . Cardiovascular: Regular rate and rhythm with no rubs or gallops.  No thyromegaly or JVD noted.  No lower extremity edema. 2/4 pulses in all 4 extremities. Marland Kitchen Respiratory: Clear to auscultation with no wheezes or rales. Good inspiratory effort. . Abdomen: Soft nontender nondistended with normal bowel sounds x4 quadrants. . Muskuloskeletal: Purulent drainage around wound edges with erythema and warmth noted over the left forefoot. . Neuro: CN II-XII intact, strength, sensation, reflexes . Skin: Left wound foul-smelling with purulence and some breakdown of the more lateral wound edge noted.   Marland Kitchen Psychiatry: Judgement and insight appear normal. Mood is appropriate for condition and setting          Labs on Admission:  Basic Metabolic Panel: Recent Labs  Lab 12/17/19 1822  NA 136  K 4.0  CL 101  CO2 28  GLUCOSE 169*  BUN 21*  CREATININE 0.92  CALCIUM 8.9   Liver Function Tests: Recent Labs  Lab 12/17/19 1822  AST 17  ALT 11  ALKPHOS 102  BILITOT 0.9  PROT 7.5  ALBUMIN 3.5   No results for input(s): LIPASE, AMYLASE in the last 168 hours. No results for input(s): AMMONIA in the last 168 hours. CBC: Recent Labs  Lab 12/17/19 1822  WBC 5.3  NEUTROABS 3.1  HGB 9.9*  HCT 31.4*  MCV 94.0  PLT 185   Cardiac Enzymes: No results for input(s): CKTOTAL, CKMB, CKMBINDEX,  TROPONINI in the last 168 hours.  BNP (last 3 results) Recent Labs    07/15/19 0424 07/16/19 0224 07/17/19 0315  BNP 130.1* 77.2 131.2*    ProBNP (last 3 results) No results for input(s): PROBNP in the last 8760 hours.  CBG: No results for input(s): GLUCAP in the last 168 hours.  Radiological Exams on Admission: DG Foot Complete Left  Result Date: 12/17/2019 CLINICAL DATA:  Infection.  Rule out osteo. EXAM: LEFT FOOT - COMPLETE 3+ VIEW COMPARISON:  11/13/2019 FINDINGS: Midfoot amputation. Progression of extensive soft tissue swelling. Cortical erosions are seen in the cuneiform bones suggesting osteomyelitis. Possible erosive changes in the cuboid bone. Soft tissue calcification is present which appears dystrophic. IMPRESSION: Midfoot amputation. Progressive soft tissue swelling and cortical erosion of the cuneiforms and possibly cuboid bones compatible with osteomyelitis and soft tissue infection. Electronically Signed   By: Franchot Gallo M.D.   On: 12/17/2019 19:12    EKG: I independently viewed the EKG done and my findings are as followed: This was not done in the ED  Assessment/Plan Present on Admission: . Osteomyelitis of ankle or foot, acute, left ([place]) . Hypertension . Diabetic infection of left foot ([employer]) . Diabetic neuropathy ([place]) . Anemia, chronic disease . Hypercholesterolemia . GERD (gastroesophageal reflux disease)  Principal Problem:   Osteomyelitis of ankle or foot, acute, left (HCC) Active Problems:   Diabetes mellitus without complication (HCC)   Hypertension   Hypercholesterolemia   Diabetic neuropathy (HCC)   GERD (gastroesophageal reflux disease)   Diabetic infection of left foot (HCC)   Anemia, chronic disease  Left foot osteomyelitis  Left foot x-ray showed midfoot amputation with progressive soft tissue swelling and cortical erosion of the cuneiforms and possibly cuboid bones compatible with osteomyelitis and soft tissue infection.  Patient's  orthopedic surgeon (Dr. March Diaz) was consulted by ED physician and recommended starting patient with IV vancomycin and Zosyn, transfer patient to [facility] with plan to see patient in the morning. We  shall continue with IV Vanco and Zosyn at this time Continue oxycodone 5 mg every 4 hours as needed for moderate pain Continue Tylenol for fever or mild pain Patient will be placed n.p.o. after midnight in anticipation for possible surgical intervention in the morning Continue wound care Continue PT eval and treat.  Hyperglycemia secondary to type II diabetes mellitus  Continue insulin sliding scale and hypoglycemia protocol Glimepiride will be held at this time  Essential hypertension Continue Lasix and lisinopril per home regimen  Hyperlipidemia Continue atorvastatin per home regimen  Diabetic neuropathy Continue home meds once med rec is updated  GERD Continue Protonix  Anemia of chronic disease 9.1/31.4, stable  Obesity Patient was counseled for diet control, exercise regimen and weight loss.   DVT prophylaxis: SCDs (chemoprophylaxis temporarily held due to possible surgical intervention in the morning)  Code Status: Full code  Family Communication: None  Disposition Plan: Possible SNF, pending orthopedic/PT recommendation  Consults called: Orthopedic surgeon (Dr. [facility]) per ED medical record  Admission status: Inpatient    Bernadette Hoit MD [facility]  If 7PM-7AM, please contact night-coverage [email or web address]  12/17/2019, 9:43 PM

## 2019-12-17 NOTE — Progress Notes (Signed)
A consult was received from an ED physician for Vancomycin per pharmacy dosing.    The patient's profile has been reviewed for ht/wt/allergies/indication/available labs.    A one time order has been placed for Vancomycin 2gm IV.  Further antibiotics/pharmacy consults should be ordered by admitting physician if indicated.                       Thank you, Everette Rank, PharmD 12/17/2019  8:14 PM

## 2019-12-17 NOTE — ED Notes (Signed)
[facility] called for transport at this time. 

## 2019-12-17 NOTE — ED Provider Notes (Signed)
Emergency Department Provider Note   I have reviewed the triage vital signs and the nursing notes.   HISTORY  Chief Complaint No chief complaint on file.   HPI Eugene Diaz is a 54 y.o. 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 (11/13/2019) presents to the emergency department with concern for wound infection.  Patient was sent in by his wound care nurse after her evaluation of the wound today.  She comes in every other Monday/Wednesday/Friday for dressing changes and came today.  She noticed a foul-smelling drainage with redness extending up the leg and referred him in.  Patient does not have pain in the foot at baseline due to neuropathy.  He has not had fever or shaking chills.  He had not noticed the changes but states that he has noticed some foul smell from the foot.  No radiation of symptoms or other modifying factors.  Past Medical History:  Diagnosis Date  . Alcohol abuse   . Alcoholic peripheral neuropathy ([place])   . Anemia   . Anxiety   . Arthritis   . Asthma    followed by pcp  . B12 deficiency   . Cellulitis of left lower leg    07-2019 and left foot  . Depression   . Diabetic neuropathy ([place])   . Essential tremor    neurologist-- dr patel--- due to alcohol abuse  . GERD (gastroesophageal reflux disease)   . History of esophageal stricture    s/p  dilatation 02-13-2018  . Hypercholesterolemia   . Hypertension    followed by pcp  (nuclear stress test 03-11-2014 low risk w/ no ischemia, ef 65%  . Osteomyelitis of great toe ([place])    left foot s/p amputation 08-14-2019  . Peripheral vascular disease ([place])   . Thrombocytopenia (HCC)    chronic  . Type 2 diabetes mellitus ([place])    followed by pcp--- last A1c in epic 6.6 on 07-12-2019  . Wound dehiscence, surgical    post left great toe amputation 08-14-2019    Patient Active Problem List   Diagnosis Date Noted    . Osteomyelitis of ankle or foot, acute, left ([place]) 12/17/2019  . Hyperglycemia 12/17/2019  . Diabetic ulcer of left midfoot associated with type 2 diabetes mellitus, with necrosis of bone ([place])   . Anemia, chronic disease 11/10/2019  . Acute osteomyelitis of left foot ([place]) 11/10/2019  . Marijuana use 11/10/2019  . Wound dehiscence 08/31/2019  . Osteomyelitis due to type 2 diabetes mellitus ([place]) 08/14/2019  . Acute osteomyelitis of toe of left foot ([place]) 08/14/2019  . Sepsis ([place]) 08/14/2019  . Hyponatremia 08/14/2019  . Normocytic anemia 08/14/2019  . B12 deficiency 08/14/2019  . Osteomyelitis of foot ([place])   . Cellulitis of toe, left   . Diabetic infection of left foot ([place])   . Toe fracture, left 07/13/2019  . Cellulitis 07/12/2019  . GERD (gastroesophageal reflux disease) 12/19/2018  . Hepatic steatosis 06/18/2018  . Gastritis, erosive   . Dysphagia, idiopathic 12/27/2017  . Rectal bleeding 12/27/2017  . Intention tremor 06/03/2016  . Left knee pain 12/04/2015  . Alcoholic peripheral neuropathy ([place]) 10/08/2014  . Diabetic neuropathy ([place]) 10/08/2014  . Chest pain, rule out acute myocardial infarction 03/10/2014  . Thrombocytopenia ([place]) 03/10/2014  . ETOH abuse 03/10/2014  . Chest pain 03/10/2014  . Diabetes mellitus without complication ([place])   . Hypertension   . Hypercholesterolemia  Past Surgical History:  Procedure Laterality Date  . AMPUTATION Left 10/16/2019   Procedure: Left partial second ray resection; placement of antibiotic beads;  Surgeon: Evelina Bucy, DPM;  [facility]: [facility];  Service: Podiatry;  Laterality: Left;  . AMPUTATION Left 11/13/2019   Procedure: Left Midfoot Amputation - Transmetatarsal vs. Lisfranc; Placement antibiotic beads;  Surgeon: Evelina Bucy, DPM;  [facility];  Service: Podiatry;  Laterality: Left;  . AMPUTATION TOE Left 08/14/2019   Procedure: AMPUTATION GREAT TOE;  Surgeon: Evelina Bucy, DPM;  [facility];   Service: Podiatry;  Laterality: Left;  . BIOPSY  02/13/2018   Procedure: BIOPSY;  Surgeon: Danie Binder, MD;  [facility];  Service: Endoscopy;;  transverse colon biopsy, gastric biopsy  . COLONOSCOPY WITH PROPOFOL N/A 02/13/2018   Procedure: COLONOSCOPY WITH PROPOFOL;  Surgeon: Danie Binder, MD;  [facility];  Service: Endoscopy;  Laterality: N/A;  11:15am  . ESOPHAGOGASTRODUODENOSCOPY (EGD) WITH PROPOFOL N/A 02/13/2018   Procedure: ESOPHAGOGASTRODUODENOSCOPY (EGD) WITH PROPOFOL;  Surgeon: Danie Binder, MD;  [facility];  Service: Endoscopy;  Laterality: N/A;  . I & D EXTREMITY Left 07/16/2019   Procedure: Insicion  AND DEBRIDEMENT EXTREMITY;  Surgeon: Evelina Bucy, DPM;  Location: [facility];  Service: Podiatry;  Laterality: Left;  . INCISION AND DRAINAGE OF WOUND Left 10/13/2019   Procedure: IRRIGATION AND DEBRIDEMENT WOUND OF LEFT FOOT AND FIRST METATARSAL RESECTION;  Surgeon: Evelina Bucy, DPM;  [facility];  Service: Podiatry;  Laterality: Left;  . IRRIGATION AND DEBRIDEMENT FOOT Left 11/11/2019   Procedure: Left Foot Wound Irrigation and Debridement;  Surgeon: Evelina Bucy, DPM;  [facility];  Service: Podiatry;  Laterality: Left;  . Left arm     Left arm repair (tendon and artery)  . PERCUTANEOUS PINNING  07/16/2019   Procedure: Open Reduction Percutaneous Pinning Extremity;  Surgeon: Evelina Bucy, DPM;  Location: [facility];  Service: Podiatry;;  . PILONIDAL CYST EXCISION N/A 08/08/2014   Procedure: CYST EXCISION PILONIDAL EXTENSIVE;  Surgeon: Jamesetta So, MD;  [facility];  Service: General;  Laterality: N/A;  . POLYPECTOMY  02/13/2018   Procedure: POLYPECTOMY;  Surgeon: Danie Binder, MD;  [facility];  Service: Endoscopy;;  transverse colon polyp hs, rectal polyps times 2  . SAVORY DILATION N/A 02/13/2018   Procedure: SAVORY DILATION;  Surgeon: Danie Binder, MD;  [facility];  Service:  Endoscopy;  Laterality: N/A;  . WOUND DEBRIDEMENT Left 09/04/2019   Procedure: DEBRIDEMENT WOUND WITH COMPLEX  REPAIR OF DEHISCENCE;  Surgeon: Evelina Bucy, DPM;  Location: [facility];  Service: Podiatry;  Laterality: Left;  Leave patient on stretcher  . WOUND DEBRIDEMENT Left 10/16/2019   Procedure: Left foot wound debridement and closure;  Surgeon: Evelina Bucy, DPM;  [facility];  Service: Podiatry;  Laterality: Left;    Allergies Patient has no known allergies.  Family History  Problem Relation Age of Onset  . Heart attack Mother        Living, 40  . Cancer Father        Deceased  . Healthy Brother   . Healthy Sister   . Colon cancer Neg Hx   . Colon polyps Neg Hx     Social History Social History   Tobacco Use  . Smoking status: Former Smoker    Packs/day: 2.50    Years: 25.00  Pack years: 62.50    Types: Cigarettes    Quit date: 08/05/1984    Years since quitting: 35.3  . Smokeless tobacco: Never Used  Substance Use Topics  . Alcohol use: Yes    Alcohol/week: 50.0 standard drinks    Types: 50 Standard drinks or equivalent per week    Comment: Drinks 6 beers, liquor 4-6oz (both daily) x 35 years  . Drug use: Yes    Types: Marijuana    Comment: 09-03-2019  per pt states very seldom    Review of Systems  Constitutional: No fever/chills Cardiovascular: Denies chest pain. Respiratory: Denies shortness of breath. Gastrointestinal: No abdominal pain.   Skin: Positive left foot infection symptoms.  Neurological: Negative for headaches. Baseline bilateral foot numbness.   10-point ROS otherwise negative.  ____________________________________________   PHYSICAL EXAM:  VITAL SIGNS: ED Triage Vitals  Enc Vitals Group     BP 12/17/19 1746 (!) 174/78     Pulse Rate 12/17/19 1746 88     Resp --      Temp 12/17/19 1746 98.3 F (36.8 C)     Temp Source 12/17/19 1746 Oral     SpO2 12/17/19 1746 98 %   Constitutional: Alert and  oriented. Well appearing and in no acute distress. Eyes: Conjunctivae are normal.  Head: Atraumatic. Nose: No congestion/rhinnorhea. Mouth/Throat: Mucous membranes are moist.   Neck: No stridor.  Cardiovascular: Normal rate, regular rhythm.  Respiratory: Normal respiratory effort.  Gastrointestinal: No distention.  Musculoskeletal: The left foot status post amputation has some purulent drainage around the wound edges with erythema and warmth over the forefoot. Mild erythema extending into the left on the left with some asymmetric edema noted.  Neurologic:  Normal speech and language. No sensation in the bilateral feet (baseline). Otherwise no focal neuro deficits.  Skin:  Skin is warm and dry. Foul smelling left foot wound with purulence and some breakdown of the more lateral wound edge. No abscess evident.   ____________________________________________   LABS (all labs ordered are listed, but only abnormal results are displayed)  Labs Reviewed  COMPREHENSIVE METABOLIC PANEL - Abnormal; Notable for the following components:      Result Value   Glucose, Bld 169 (*)    BUN 21 (*)    All other components within normal limits  CBC WITH DIFFERENTIAL/PLATELET - Abnormal; Notable for the following components:   RBC 3.34 (*)    Hemoglobin 9.9 (*)    HCT 31.4 (*)    RDW 17.0 (*)    All other components within normal limits  CBC WITH DIFFERENTIAL/PLATELET - Abnormal; Notable for the following components:   RBC 3.30 (*)    Hemoglobin 9.8 (*)    HCT 30.1 (*)    RDW 16.7 (*)    All other components within normal limits  BASIC METABOLIC PANEL - Abnormal; Notable for the following components:   Glucose, Bld 119 (*)    All other components within normal limits  GLUCOSE, CAPILLARY - Abnormal; Notable for the following components:   Glucose-Capillary 122 (*)    All other components within normal limits  GLUCOSE, CAPILLARY - Abnormal; Notable for the following components:   Glucose-Capillary  135 (*)    All other components within normal limits  GLUCOSE, CAPILLARY - Abnormal; Notable for the following components:   Glucose-Capillary 131 (*)    All other components within normal limits  GLUCOSE, CAPILLARY - Abnormal; Notable for the following components:   Glucose-Capillary 122 (*)    All  other components within normal limits  GLUCOSE, CAPILLARY - Abnormal; Notable for the following components:   Glucose-Capillary 252 (*)    All other components within normal limits  CULTURE, BLOOD (ROUTINE X 2)  CULTURE, BLOOD (ROUTINE X 2)  RESPIRATORY PANEL BY RT PCR (FLU A&B, COVID)  AEROBIC/ANAEROBIC CULTURE (SURGICAL/DEEP WOUND)  AEROBIC/ANAEROBIC CULTURE (SURGICAL/DEEP WOUND)  LACTIC ACID, PLASMA  GLUCOSE, CAPILLARY  CBC WITH DIFFERENTIAL/PLATELET  BASIC METABOLIC PANEL  VITAMIN [id number]  FOLATE  IRON AND TIBC  FERRITIN   ____________________________________________  RADIOLOGY  DG Foot Complete Left  Result Date: 12/17/2019 CLINICAL DATA:  Infection.  Rule out osteo. EXAM: LEFT FOOT - COMPLETE 3+ VIEW COMPARISON:  11/13/2019 FINDINGS: Midfoot amputation. Progression of extensive soft tissue swelling. Cortical erosions are seen in the cuneiform bones suggesting osteomyelitis. Possible erosive changes in the cuboid bone. Soft tissue calcification is present which appears dystrophic. IMPRESSION: Midfoot amputation. Progressive soft tissue swelling and cortical erosion of the cuneiforms and possibly cuboid bones compatible with osteomyelitis and soft tissue infection. Electronically Signed   By: Franchot Gallo M.D.   On: 12/17/2019 19:12    ____________________________________________   PROCEDURES  Procedure(s) performed:   Procedures  None ____________________________________________   INITIAL IMPRESSION / ASSESSMENT AND PLAN / ED COURSE  Pertinent labs & imaging results that were available during my care of the patient were reviewed by me and considered in my medical  decision making (see chart for details).   Patient with chronic left foot wound now status post Lisfranc amputation.  The wound is foul-smelling with some purulence around the wound edges.  There is some erythema surrounding the incision as well as the left leg.  Left leg erythema could be emanating from the foot with active infection/cellulitis.  Venous stasis dermatitis is also a consideration.  The right leg does not have similar findings to making me favor cellulitis as a cause for leg redness.   07:40 PM  Patient's x-ray shows cortical erosion in the cuneiform and possibly cuboid bones compatible with osteomyelitis.  Will reach out to the [facility] provider on call, covering for [name], regarding plan and admit location which will likely be [facility] to the hospitalist service.  08:05 PM  Spoke with [name] with [facility].  He would prefer to have the patient admitted to [facility] and started on vancomycin and Zosyn under the hospitalist service.  He will consult on the patient once he arrives to [facility] and would appreciate a courtesy call when the patient arrived so that he can see them and decide on further management.   Discussed patient's case with [facility] to request admission. Patient and family (if present) updated with plan. Care transferred to [facility] service.  I reviewed all nursing notes, vitals, pertinent old records, EKGs, labs, imaging (as available).  ____________________________________________  FINAL CLINICAL IMPRESSION(S) / ED DIAGNOSES  Final diagnoses:  Acute osteomyelitis of left foot (HCC)  Left leg cellulitis     MEDICATIONS GIVEN DURING THIS VISIT:  Medications  acetaminophen (TYLENOL) tablet 650 mg ( Oral MAR Unhold 12/18/19 1623)    Or  acetaminophen (TYLENOL) suppository 650 mg ( Rectal MAR Unhold 12/18/19 1623)  oxyCODONE (Oxy IR/ROXICODONE) immediate release tablet 5 mg ( Oral MAR Unhold 12/18/19 1623)  atorvastatin (LIPITOR)  tablet 40 mg (40 mg Oral Given 12/18/19 1728)  furosemide (LASIX) tablet 20 mg ( Oral MAR Unhold 12/18/19 1623)  lisinopril (ZESTRIL) tablet 20 mg ( Oral MAR Unhold 12/18/19  1623)  pantoprazole (PROTONIX) EC tablet 40 mg ( Oral MAR Unhold 12/18/19 1623)  albuterol (PROVENTIL) (2.5 MG/3ML) 0.083% nebulizer solution 2.5 mg ( Inhalation MAR Unhold 12/18/19 1623)  insulin aspart (novoLOG) injection 0-15 Units (2 Units Subcutaneous Given 12/18/19 1728)  insulin aspart (novoLOG) injection 0-5 Units ( Subcutaneous MAR Unhold 12/18/19 1623)  vancomycin (VANCOREADY) IVPB 1500 mg/300 mL ( Intravenous MAR Unhold 12/18/19 1623)  piperacillin-tazobactam (ZOSYN) IVPB 3.375 g ( Intravenous MAR Unhold 12/18/19 1623)  amitriptyline (ELAVIL) tablet 100 mg ( Oral MAR Unhold 12/18/19 1623)  DULoxetine (CYMBALTA) DR capsule 60 mg ( Oral MAR Unhold 12/18/19 1623)  ferrous sulfate tablet 325 mg ( Oral MAR Unhold 12/18/19 1623)  glycopyrrolate (ROBINUL) tablet 1 mg ( Oral MAR Unhold 12/18/19 1623)  HYDROcodone-acetaminophen (NORCO/VICODIN) 5-325 MG per tablet 1 tablet ( Oral MAR Unhold 12/18/19 1623)  hydrOXYzine (ATARAX/VISTARIL) tablet 25 mg ( Oral MAR Unhold 12/18/19 1623)  primidone (MYSOLINE) tablet 50 mg ( Oral MAR Unhold 12/18/19 1623)  gabapentin (NEURONTIN) capsule 900 mg ( Oral MAR Unhold 12/18/19 1623)    And  gabapentin (NEURONTIN) capsule 1,200 mg ( Oral MAR Unhold 12/18/19 1623)  ALPRAZolam (XANAX) tablet 1 mg ( Oral MAR Unhold 12/18/19 1623)  piperacillin-tazobactam (ZOSYN) IVPB 3.375 g (0 g Intravenous Stopped 12/17/19 2111)  vancomycin (VANCOREADY) IVPB 2000 mg/400 mL (0 mg Intravenous Stopped 12/17/19 2245)    Note:  This document was prepared using Dragon voice recognition software and may include unintentional dictation errors.  Nanda Quinton, MD, [facility]    Jud Fanguy, [facility], MD 12/18/19 [phone]

## 2019-12-17 NOTE — ED Notes (Signed)
Patient is resting comfortably. 

## 2019-12-17 NOTE — ED Notes (Signed)
Report given to [facility]. 

## 2019-12-17 NOTE — ED Triage Notes (Signed)
Pt has homehealth nurse and was advised to come has left foot amputation evaluated. Wound is red and draining. Red streaks up left leg.

## 2019-12-17 NOTE — Telephone Encounter (Signed)
[place] stating she saw the pt today for wound dressing change, nurse states the pt's dressing was saturated in green thick drainage, hot to the touch, bright red and had 2 new blisters in his foot. Nurse states she advised the pt to go to ER and pt was heading with his mom to [place]. Nurse also pointed out pt has not been taking his medications and has missed his last two appointments.

## 2019-12-17 NOTE — ED Notes (Signed)
Pt to xray

## 2019-12-18 ENCOUNTER — Inpatient Hospital Stay (HOSPITAL_COMMUNITY): Payer: Medicaid Other | Admitting: Anesthesiology

## 2019-12-18 ENCOUNTER — Telehealth: Payer: Self-pay | Admitting: Podiatry

## 2019-12-18 ENCOUNTER — Encounter (HOSPITAL_COMMUNITY): Payer: Self-pay | Admitting: Internal Medicine

## 2019-12-18 ENCOUNTER — Encounter (HOSPITAL_COMMUNITY)
Admission: EM | Disposition: A | Discharge status: Skilled Nursing Facility | Payer: Self-pay | Source: Home / Self Care | Attending: Internal Medicine

## 2019-12-18 ENCOUNTER — Ambulatory Visit: Payer: Self-pay | Admitting: Podiatry

## 2019-12-18 DIAGNOSIS — E0841 Diabetes mellitus due to underlying condition with diabetic mononeuropathy: Secondary | ICD-10-CM

## 2019-12-18 DIAGNOSIS — M86672 Other chronic osteomyelitis, left ankle and foot: Secondary | ICD-10-CM | POA: Diagnosis not present

## 2019-12-18 HISTORY — PX: WOUND DEBRIDEMENT: SHX247

## 2019-12-18 LAB — BASIC METABOLIC PANEL
Anion gap: 11 (ref 5–15)
BUN: 13 mg/dL (ref 6–20)
CO2: 27 mmol/L (ref 22–32)
Calcium: 9.3 mg/dL (ref 8.9–10.3)
Chloride: 104 mmol/L (ref 98–111)
Creatinine, Ser: 0.87 mg/dL (ref 0.61–1.24)
GFR calc Af Amer: >60 mL/min (ref >60)
GFR calc non Af Amer: >60 mL/min (ref >60)
Glucose, Bld: 119 mg/dL — ABNORMAL HIGH (ref 70–99)
Potassium: 4.1 mmol/L (ref 3.5–5.1)
Sodium: 142 mmol/L (ref 135–145)

## 2019-12-18 LAB — CBC WITH DIFFERENTIAL/PLATELET
Abs Immature Granulocytes: 0.04 10*3/uL (ref 0.00–0.07)
Basophils Absolute: 0.1 10*3/uL (ref 0.0–0.1)
Basophils Relative: 1 %
Eosinophils Absolute: 0.3 10*3/uL (ref 0.0–0.5)
Eosinophils Relative: 6 %
HCT: 30.1 % — ABNORMAL LOW (ref 39.0–52.0)
Hemoglobin: 9.8 g/dL — ABNORMAL LOW (ref 13.0–17.0)
Immature Granulocytes: 1 %
Lymphocytes Relative: 22 %
Lymphs Abs: 1.1 10*3/uL (ref 0.7–4.0)
MCH: 29.7 pg (ref 26.0–34.0)
MCHC: 32.6 g/dL (ref 30.0–36.0)
MCV: 91.2 fL (ref 80.0–100.0)
Monocytes Absolute: 0.2 10*3/uL (ref 0.1–1.0)
Monocytes Relative: 5 %
Neutro Abs: 3.1 10*3/uL (ref 1.7–7.7)
Neutrophils Relative %: 65 %
Platelets: 177 10*3/uL (ref 150–400)
RBC: 3.3 MIL/uL — ABNORMAL LOW (ref 4.22–5.81)
RDW: 16.7 % — ABNORMAL HIGH (ref 11.5–15.5)
WBC: 4.7 10*3/uL (ref 4.0–10.5)
nRBC: 0 % (ref 0.0–0.2)

## 2019-12-18 LAB — GLUCOSE, CAPILLARY
Glucose-Capillary: 122 mg/dL — ABNORMAL HIGH (ref 70–99)
Glucose-Capillary: 122 mg/dL — ABNORMAL HIGH (ref 70–99)
Glucose-Capillary: 131 mg/dL — ABNORMAL HIGH (ref 70–99)
Glucose-Capillary: 135 mg/dL — ABNORMAL HIGH (ref 70–99)
Glucose-Capillary: 252 mg/dL — ABNORMAL HIGH (ref 70–99)
Glucose-Capillary: 91 mg/dL (ref 70–99)

## 2019-12-18 SURGERY — DEBRIDEMENT, WOUND
Anesthesia: Monitor Anesthesia Care | Laterality: Left

## 2019-12-18 MED ORDER — HYDROXYZINE HCL 25 MG PO TABS: 25.0000 mg | ORAL_TABLET | Freq: Four times a day (QID) | ORAL | Status: DC | PRN

## 2019-12-18 MED ORDER — 0.9 % SODIUM CHLORIDE (POUR BTL) OPTIME
TOPICAL | Status: DC | PRN
  Administered 2019-12-18: 1000 mL

## 2019-12-18 MED ORDER — AMITRIPTYLINE HCL 50 MG PO TABS
100.0000 mg | ORAL_TABLET | Freq: Every day | ORAL | Status: DC
  Administered 2019-12-18 – 2019-12-26 (×9): 100 mg via ORAL
  Filled 2019-12-18 (×9): qty 2

## 2019-12-18 MED ORDER — ONDANSETRON HCL 4 MG/2ML IJ SOLN
INTRAMUSCULAR | Status: DC | PRN
  Administered 2019-12-18: 4 mg via INTRAVENOUS

## 2019-12-18 MED ORDER — GABAPENTIN 400 MG PO CAPS
1200.0000 mg | ORAL_CAPSULE | Freq: Every day | ORAL | Status: DC
  Administered 2019-12-18 – 2019-12-26 (×9): 1200 mg via ORAL
  Filled 2019-12-18 (×9): qty 3

## 2019-12-18 MED ORDER — LACTATED RINGERS IV SOLN: INTRAVENOUS | Status: DC

## 2019-12-18 MED ORDER — GLYCOPYRROLATE 1 MG PO TABS
1.0000 mg | ORAL_TABLET | Freq: Two times a day (BID) | ORAL | Status: DC
  Administered 2019-12-18 – 2019-12-27 (×18): 1 mg via ORAL
  Filled 2019-12-18 (×19): qty 1

## 2019-12-18 MED ORDER — BUPIVACAINE HCL (PF) 0.5 % IJ SOLN
INTRAMUSCULAR | Status: AC
  Filled 2019-12-18: qty 30

## 2019-12-18 MED ORDER — GABAPENTIN 300 MG PO CAPS: 900.0000 mg | ORAL_CAPSULE | ORAL | Status: DC

## 2019-12-18 MED ORDER — PRIMIDONE 50 MG PO TABS
50.0000 mg | ORAL_TABLET | Freq: Two times a day (BID) | ORAL | Status: DC
  Administered 2019-12-18 – 2019-12-27 (×18): 50 mg via ORAL
  Filled 2019-12-18 (×21): qty 1

## 2019-12-18 MED ORDER — PROPOFOL 10 MG/ML IV BOLUS
INTRAVENOUS | Status: AC
  Filled 2019-12-18: qty 20

## 2019-12-18 MED ORDER — ONDANSETRON HCL 4 MG/2ML IJ SOLN
INTRAMUSCULAR | Status: AC
  Filled 2019-12-18: qty 2

## 2019-12-18 MED ORDER — SODIUM CHLORIDE 0.9 % IR SOLN
Status: DC | PRN
  Administered 2019-12-18: 3000 mL

## 2019-12-18 MED ORDER — VANCOMYCIN HCL 1000 MG IV SOLR
INTRAVENOUS | Status: AC
  Filled 2019-12-18: qty 1000

## 2019-12-18 MED ORDER — MIDAZOLAM HCL 5 MG/5ML IJ SOLN
INTRAMUSCULAR | Status: DC | PRN
  Administered 2019-12-18: 2 mg via INTRAVENOUS

## 2019-12-18 MED ORDER — PROPOFOL 500 MG/50ML IV EMUL
INTRAVENOUS | Status: DC | PRN
  Administered 2019-12-18: 50 ug/kg/min via INTRAVENOUS

## 2019-12-18 MED ORDER — FERROUS SULFATE 325 (65 FE) MG PO TABS
325.0000 mg | ORAL_TABLET | Freq: Every day | ORAL | Status: DC
  Administered 2019-12-19 – 2019-12-27 (×9): 325 mg via ORAL
  Filled 2019-12-18 (×9): qty 1

## 2019-12-18 MED ORDER — BUPIVACAINE HCL (PF) 0.5 % IJ SOLN
INTRAMUSCULAR | Status: DC | PRN
  Administered 2019-12-18: 10 mL

## 2019-12-18 MED ORDER — PROPOFOL 10 MG/ML IV BOLUS
INTRAVENOUS | Status: DC | PRN
  Administered 2019-12-18: 50 mg via INTRAVENOUS

## 2019-12-18 MED ORDER — HEMOSTATIC AGENTS (NO CHARGE) OPTIME
TOPICAL | Status: DC | PRN
  Administered 2019-12-18 (×2): 1 via TOPICAL

## 2019-12-18 MED ORDER — DULOXETINE HCL 60 MG PO CPEP
60.0000 mg | ORAL_CAPSULE | Freq: Every day | ORAL | Status: DC
  Administered 2019-12-19 – 2019-12-27 (×9): 60 mg via ORAL
  Filled 2019-12-18 (×9): qty 1

## 2019-12-18 MED ORDER — MIDAZOLAM HCL 2 MG/2ML IJ SOLN
INTRAMUSCULAR | Status: AC
  Filled 2019-12-18: qty 2

## 2019-12-18 MED ORDER — HYDROCODONE-ACETAMINOPHEN 5-325 MG PO TABS
1.0000 | ORAL_TABLET | Freq: Four times a day (QID) | ORAL | Status: DC | PRN
  Administered 2019-12-22 – 2019-12-25 (×5): 1 via ORAL
  Filled 2019-12-18 (×5): qty 1

## 2019-12-18 MED ORDER — VANCOMYCIN HCL 1000 MG IV SOLR
INTRAVENOUS | Status: DC | PRN
  Administered 2019-12-18: 1000 mg via TOPICAL

## 2019-12-18 MED ORDER — ALPRAZOLAM 0.5 MG PO TABS
1.0000 mg | ORAL_TABLET | Freq: Three times a day (TID) | ORAL | Status: DC | PRN
  Administered 2019-12-19 – 2019-12-24 (×13): 1 mg via ORAL
  Filled 2019-12-18 (×13): qty 2

## 2019-12-18 MED ORDER — FENTANYL CITRATE (PF) 250 MCG/5ML IJ SOLN
INTRAMUSCULAR | Status: AC
  Filled 2019-12-18: qty 5

## 2019-12-18 MED ORDER — GABAPENTIN 300 MG PO CAPS
900.0000 mg | ORAL_CAPSULE | Freq: Two times a day (BID) | ORAL | Status: DC
  Administered 2019-12-19 – 2019-12-27 (×17): 900 mg via ORAL
  Filled 2019-12-18 (×17): qty 3

## 2019-12-18 MED ORDER — FENTANYL CITRATE (PF) 100 MCG/2ML IJ SOLN
INTRAMUSCULAR | Status: DC | PRN
  Administered 2019-12-18 (×2): 50 ug via INTRAVENOUS

## 2019-12-18 MED ORDER — ALPRAZOLAM 0.5 MG PO TABS: 1.0000 mg | ORAL_TABLET | Freq: Four times a day (QID) | ORAL | Status: DC

## 2019-12-18 SURGICAL SUPPLY — 53 items
APL PRP STRL LF DISP 70% ISPRP (MISCELLANEOUS) ×1
BNDG CMPR 9X4 STRL LF SNTH (GAUZE/BANDAGES/DRESSINGS)
BNDG ELASTIC 4X5.8 VLCR STR LF (GAUZE/BANDAGES/DRESSINGS) ×2 IMPLANT
BNDG ESMARK 4X9 LF (GAUZE/BANDAGES/DRESSINGS) IMPLANT
BNDG GAUZE ELAST 4 BULKY (GAUZE/BANDAGES/DRESSINGS) ×2 IMPLANT
CANISTER WOUNDNEG PRESSURE 500 (CANNISTER) ×1 IMPLANT
CHLORAPREP W/TINT 26 (MISCELLANEOUS) ×2 IMPLANT
CNTNR URN SCR LID CUP LEK RST (MISCELLANEOUS) IMPLANT
CONT SPEC 4OZ STRL OR WHT (MISCELLANEOUS) ×2
COVER SURGICAL LIGHT HANDLE (MISCELLANEOUS) ×1 IMPLANT
COVER WAND RF STERILE (DRAPES) ×1 IMPLANT
CUFF TOURN SGL QUICK 18X4 (TOURNIQUET CUFF) ×1 IMPLANT
CUFF TOURN SGL QUICK 34 (TOURNIQUET CUFF)
CUFF TRNQT CYL 34X4.125X (TOURNIQUET CUFF) IMPLANT
DRAPE U-SHAPE 47X51 STRL (DRAPES) ×2 IMPLANT
DRSG PAD ABDOMINAL 8X10 ST (GAUZE/BANDAGES/DRESSINGS) ×2 IMPLANT
DRSG VAC ATS MED SENSATRAC (GAUZE/BANDAGES/DRESSINGS) ×1 IMPLANT
DRSG VERSA FOAM LRG 10X15 (GAUZE/BANDAGES/DRESSINGS) ×1 IMPLANT
ELECT CAUTERY BLADE 6.4 (BLADE) ×2 IMPLANT
ELECT REM PT RETURN 9FT ADLT (ELECTROSURGICAL) ×2
ELECTRODE REM PT RTRN 9FT ADLT (ELECTROSURGICAL) ×1 IMPLANT
GAUZE SPONGE 4X4 12PLY STRL (GAUZE/BANDAGES/DRESSINGS) IMPLANT
GLOVE BIO SURGEON STRL SZ7.5 (GLOVE) ×2 IMPLANT
GLOVE BIOGEL PI IND STRL 8 (GLOVE) ×1 IMPLANT
GLOVE BIOGEL PI INDICATOR 8 (GLOVE) ×1
GOWN STRL REUS W/ TWL LRG LVL3 (GOWN DISPOSABLE) ×1 IMPLANT
GOWN STRL REUS W/ TWL XL LVL3 (GOWN DISPOSABLE) ×1 IMPLANT
GOWN STRL REUS W/TWL LRG LVL3 (GOWN DISPOSABLE) ×2
GOWN STRL REUS W/TWL XL LVL3 (GOWN DISPOSABLE) ×2
HANDPIECE INTERPULSE COAX TIP (DISPOSABLE) ×2
HEMOSTAT SURGICEL 2X14 (HEMOSTASIS) ×2 IMPLANT
KIT BASIN OR (CUSTOM PROCEDURE TRAY) ×3 IMPLANT
KIT TURNOVER KIT B (KITS) ×2 IMPLANT
MANIFOLD NEPTUNE II (INSTRUMENTS) ×2 IMPLANT
NDL BIOPSY JAMSHIDI 8X6 (NEEDLE) IMPLANT
NDL HYPO 25GX1X1/2 BEV (NEEDLE) IMPLANT
NEEDLE BIOPSY JAMSHIDI 8X6 (NEEDLE) IMPLANT
NEEDLE HYPO 25GX1X1/2 BEV (NEEDLE) IMPLANT
NS IRRIG 1000ML POUR BTL (IV SOLUTION) ×2 IMPLANT
PACK ORTHO EXTREMITY (CUSTOM PROCEDURE TRAY) ×2 IMPLANT
PAD ARMBOARD 7.5X6 YLW CONV (MISCELLANEOUS) ×4 IMPLANT
PROBE DEBRIDE SONICVAC MISONIX (TIP) IMPLANT
SET CYSTO W/LG BORE CLAMP LF (SET/KITS/TRAYS/PACK) ×2 IMPLANT
SET HNDPC FAN SPRY TIP SCT (DISPOSABLE) IMPLANT
SOL PREP POV-IOD 4OZ 10% (MISCELLANEOUS) ×4 IMPLANT
SUT ETHILON 2 0 FS 18 (SUTURE) ×1 IMPLANT
SWAB CULTURE LIQ STUART DBL (MISCELLANEOUS) ×1 IMPLANT
SWAB CULTURE LIQUID MINI MALE (MISCELLANEOUS) ×1 IMPLANT
SYR CONTROL 10ML LL (SYRINGE) IMPLANT
TOWEL GREEN STERILE (TOWEL DISPOSABLE) ×2 IMPLANT
TOWEL GREEN STERILE FF (TOWEL DISPOSABLE) ×2 IMPLANT
TUBE CONNECTING 12X1/4 (SUCTIONS) ×2 IMPLANT
YANKAUER SUCT BULB TIP NO VENT (SUCTIONS) ×2 IMPLANT

## 2019-12-18 NOTE — Plan of Care (Signed)
  Problem: Education: Goal: Knowledge of General Education information will improve Description Including pain rating scale, medication(s)/side effects and non-pharmacologic comfort measures Outcome: Progressing   

## 2019-12-18 NOTE — Anesthesia Preprocedure Evaluation (Signed)
Anesthesia Evaluation  Patient identified by MRN, date of birth, ID band Patient awake    Reviewed: Allergy & Precautions, NPO status , Patient's Chart, lab work & pertinent test results  Airway Mallampati: II  TM Distance: >3 FB Neck ROM: Full    Dental  (+) Poor Dentition, Dental Advisory Given,  Extremely poor dentition:   Pulmonary asthma , COPD, former smoker,  Quit smoking 1985- 62.5 pack year history  Uses rescue inhaler daily- uses 3-4x/d   Pulmonary exam normal breath sounds clear to auscultation       Cardiovascular hypertension, Pt. on medications + Peripheral Vascular Disease and +CHF  Normal cardiovascular exam Rhythm:Regular Rate:Normal  ECG: SR, rate 77  Last echo 2015: Mild LVH with LVEF 55-60%, grade 2  diastolic dysfunction. Mild MAC with trivial mtiral  regurgitation. Mildly sclerotic aortic valve. Mildly  reduced RV contraction. Unable to assess PASP. No  pericardial effusion.    Neuro/Psych PSYCHIATRIC DISORDERS Anxiety Depression Essential tremor Alcohol related peripheral neuropathy  Neuromuscular disease    GI/Hepatic PUD, GERD  Medicated,(+)     substance abuse  alcohol use,   Endo/Other  negative endocrine ROSdiabetes  Renal/GU negative Renal ROS  negative genitourinary   Musculoskeletal  (+) Arthritis , narcotic dependentOsteomyelitis of left foot. Dehiscence of wound   Abdominal (+) + obese,   Peds  Hematology  (+) anemia , HLD   Anesthesia Other Findings   Reproductive/Obstetrics negative OB ROS                             Lab Results  Component Value Date   WBC 4.7 12/18/2019   HGB 9.8 (L) 12/18/2019   HCT 30.1 (L) 12/18/2019   MCV 91.2 12/18/2019   PLT 177 12/18/2019   Lab Results  Component Value Date   CREATININE 0.87 12/18/2019   BUN 13 12/18/2019   NA 142 12/18/2019   K 4.1 12/18/2019   CL 104 12/18/2019   CO2 27 12/18/2019     Anesthesia Physical  Anesthesia Plan  ASA: III  Anesthesia Plan: MAC   Post-op Pain Management:    Induction:   PONV Risk Score and Plan: 1 and Ondansetron, Treatment may vary due to age or medical condition and Propofol infusion  Airway Management Planned: Natural Airway and Simple Face Mask  Additional Equipment: None  Intra-op Plan:   Post-operative Plan:   Informed Consent: I have reviewed the patients History and Physical, chart, labs and discussed the procedure including the risks, benefits and alternatives for the proposed anesthesia with the patient or authorized representative who has indicated his/her understanding and acceptance.     Dental advisory given  Plan Discussed with: CRNA and Surgeon  Anesthesia Plan Comments:         Anesthesia Quick Evaluation

## 2019-12-18 NOTE — Evaluation (Signed)
Physical Therapy Evaluation Patient Details Name: Eugene Diaz MRN: [id number] DOB: 09/18/1966 Today's Date: 12/18/2019   History of Present Illness  Pt is a 54 y/o male admitted secondary to L foot wound infection s/p L transmetatarsal amputation on 11/13/19. PMH including but not limited to DM and HTN.    Clinical Impression  Pt presented supine in bed with HOB elevated, awake and willing to participate in therapy session. Prior to admission, pt reported that he ambulated with either a cane or crutches and was independent with ADLs. Pt lives alone in a single level mobile home with one step to enter. At the time of evaluation, pt overall moving well. There was a boot present during evaluation; however, pt reporting that it smelled and declining use. No MD orders in chart regarding a boot or WB'ing status. Pt able to ambulate in room with min guard for safety while pushing IV pole (declining use of RW). Pt would continue to benefit from skilled physical therapy services at this time while admitted and after d/c to address the below listed limitations in order to improve overall safety and independence with functional mobility.     Follow Up Recommendations Home health PT    Equipment Recommendations  None recommended by PT    Recommendations for Other Services       Precautions / Restrictions Precautions Precautions: Fall Restrictions Weight Bearing Restrictions: No      Mobility  Bed Mobility Overal bed mobility: Needs Assistance Bed Mobility: Supine to Sit     Supine to sit: Supervision     General bed mobility comments: no physical assistance needed  Transfers Overall transfer level: Needs assistance Equipment used: None Transfers: Sit to/from Stand Sit to Stand: Min guard         General transfer comment: min guard for safety with transitional movement, no AD needed  Ambulation/Gait Ambulation/Gait assistance: Min guard Gait Distance (Feet): 30  Feet Assistive device: IV Pole Gait Pattern/deviations: Step-through pattern;Decreased step length - right;Decreased step length - left;Decreased stride length Gait velocity: decreased   General Gait Details: pt with slow, cautious gait; mild instability but no overt LOB or need for physical assistance  Stairs            Wheelchair Mobility    Modified Rankin (Stroke Patients Only)       Balance Overall balance assessment: Needs assistance Sitting-balance support: Feet supported Sitting balance-Leahy Scale: Good     Standing balance support: During functional activity Standing balance-Leahy Scale: Fair                               Pertinent Vitals/Pain Pain Assessment: 0-10 Pain Score: 8  Pain Location: L foot Pain Descriptors / Indicators: Aching Pain Intervention(s): Monitored during session;Repositioned    Home Living Family/patient expects to be discharged to:: Private residence Living Arrangements: Alone Available Help at Discharge: Friend(s);Family;Available PRN/intermittently Type of Home: Mobile home Home Access: Stairs to enter   Entrance Stairs-Number of Steps: 1 Home Layout: One level Home Equipment: Cane - single point;Crutches;Walker - 2 wheels      Prior Function Level of Independence: Independent with assistive device(s)         Comments: ambulates with either crutches or a cane; washes up at that since     Hand Dominance        Extremity/Trunk Assessment   Upper Extremity Assessment Upper Extremity Assessment: Overall WFL for tasks assessed  Lower Extremity Assessment Lower Extremity Assessment: Overall WFL for tasks assessed;LLE deficits/detail LLE Deficits / Details: pt with h/o transmetatarsal amputation; stitches and staples still in place; however, open wound along the incision site       Communication   Communication: No difficulties  Cognition Arousal/Alertness: Awake/alert Behavior During Therapy:  WFL for tasks assessed/performed Overall Cognitive Status: Within Functional Limits for tasks assessed                                        General Comments      Exercises     Assessment/Plan    PT Assessment Patient needs continued PT services  PT Problem List Decreased strength;Decreased range of motion;Decreased activity tolerance;Decreased mobility;Decreased balance;Decreased coordination;Decreased knowledge of use of DME;Decreased safety awareness;Decreased knowledge of precautions;Pain       PT Treatment Interventions DME instruction;Gait training;Stair training;Functional mobility training;Therapeutic activities;Therapeutic exercise;Balance training;Neuromuscular re-education;Patient/family education    PT Goals (Current goals can be found in the Care Plan section)  Acute Rehab PT Goals Patient Stated Goal: "go home" PT Goal Formulation: With patient Time For Goal Achievement: 01/01/20 Potential to Achieve Goals: Good    Frequency Min 3X/week   Barriers to discharge        Co-evaluation               AM-PAC PT "6 Clicks" Mobility  Outcome Measure Help needed turning from your back to your side while in a flat bed without using bedrails?: None Help needed moving from lying on your back to sitting on the side of a flat bed without using bedrails?: None Help needed moving to and from a bed to a chair (including a wheelchair)?: None Help needed standing up from a chair using your arms (e.g., wheelchair or bedside chair)?: None Help needed to walk in hospital room?: A Little Help needed climbing 3-5 steps with a railing? : A Little 6 Click Score: 22    End of Session   Activity Tolerance: Patient tolerated treatment well Patient left: in chair;with call bell/phone within reach;with chair alarm set Nurse Communication: Mobility status PT Visit Diagnosis: Other abnormalities of gait and mobility (R26.89);Muscle weakness (generalized)  (M62.81);Pain Pain - Right/Left: Left Pain - part of body: Leg;Ankle and joints of foot    Time: [id number] PT Time Calculation (min) (ACUTE ONLY): 21 min   Charges:   PT Evaluation $PT Eval Moderate Complexity: 1 Mod          Eduard Clos, PT, DPT  Acute Rehabilitation Services Pager [phone] Office [place] 12/18/2019, 11:14 AM

## 2019-12-18 NOTE — Progress Notes (Signed)
PROGRESS NOTE  Eugene Diaz [id number] DOB: [birth year] DOA: 12/17/2019 PCP: [name], PA-C  HPI/Recap of past 24 hours: HPI from [name] is a 54 y.o. male with medical history significant for DM, GERD, peripheral neuropathy, left wound requiring midfoot Lisfranc amputation with [name] on November 13, 2019 presents to the emergency department with concern of wound infection.  Patient has a wound nurse who sees patient on Monday/Wednesday/Friday every other week for wound dressing changes. Pt noted to have foul-smelling draining discharge with redness extending up the leg, asked to go to the ED for further evaluation. In the ED, BP was elevated at 174/78, but other vital signs were stable. Labs showed normocytic anemia and hyperglycemia.  Left foot x-ray showed midfoot amputation with progressive soft tissue swelling and cortical erosion of the cuneiforms and possibly cuboid bones compatible with osteomyelitis and soft tissue infection. Patient's podiatrist ([name]) was consulted by EDP and recommended starting patient with IV vancomycin and Zosyn with plan to admit patient to hospitalist service at [facility].    Today, met patient sad and crying, reports he misses his wife who passed away in 07/09/19.  Counseled him at bedside and offered mental health/psychiatry evaluation which patient politely declined.  Likely normal grief.  Denies any new complaints, denies any chest pain, abdominal pain, nausea/vomiting, fever/chills.   Assessment/Plan: Principal Problem:   Osteomyelitis of ankle or foot, acute, left (HCC) Active Problems:   Diabetes mellitus without complication (HCC)   Hypertension   Hypercholesterolemia   Diabetic neuropathy (HCC)   GERD (gastroesophageal reflux disease)   Diabetic infection of left foot (HCC)   Anemia, chronic disease   Hyperglycemia   Left foot wound dehiscence and infection/?Osteomyelitis Currently afebrile with no  leukocytosis BC x2 pending X-ray of left foot showed progressive soft tissue swelling and cortical erosion compatible with osteomyelitis and soft tissue infection Podiatry, [name], on board, plan for wound debridement, VAC application Continue empiric Vanco and Zosyn Wound care PT/OT Daily CBC  Chronic normocytic anemia/iron deficiency Likely 2/2 chronic disease Hemoglobin around baseline Anemia panel pending Continue oral iron supplementation Daily CBC  Diabetes mellitus type 2/neuropathy Last A1c 6.4 on 11/10/2019 SSI, Accu-Cheks, hypoglycemic protocol Continue gabapentin Hold home glimepiride  Hypertension Continue lisinopril, Lasix per home regimen  Hyperlipidemia Continue statins  GERD Continue Protonix  Anxiety/depression/grieving Continue Xanax, Cymbalta, hydroxyzine  Obesity Lifestyle modification advised       Malnutrition Type:      Malnutrition Characteristics:      Nutrition Interventions:       Estimated body mass index is 34.67 kg/m as calculated from the following:   Height as of this encounter: 6' 2"  (1.88 m).   Weight as of this encounter: 122.5 kg.     Code Status: Full  Family Communication: None at bedside  Disposition Plan: Likely home once work-up complete/consultant signing off   Consultants:  Podiatry, [name]  Procedures:  Wound debridement  Antimicrobials:  Vancomycin  Zosyn  DVT prophylaxis: SCD-due to pending surgery   Objective: Vitals:   12/17/19 2246 12/17/19 2357 12/18/19 0000 12/18/19 0920  BP: (!) 167/76 (!) 185/92  (!) 160/80  Pulse: 76 85  70  Resp: 18 16    Temp: 98 F (36.7 C) 98.7 F (37.1 C)    TempSrc:  Oral    SpO2: 98% 100%    Weight:   122.5 kg   Height:   6' 2"  (1.88 m)  Intake/Output Summary (Last 24 hours) at 12/18/2019 1357 Last data filed at 12/18/2019 1241 Gross per 24 hour  Intake 29.79 ml  Output 2000 ml  Net -1970.21 ml   Filed Weights   12/17/19 2210  12/18/19 0000  Weight: 122.5 kg 122.5 kg    Exam:  General: NAD, sobbing  Cardiovascular: S1, S2 present  Respiratory: CTAB  Abdomen: Soft, nontender, nondistended, bowel sounds present  Musculoskeletal: No bilateral pedal edema noted, purulent drainage around wound edges with erythema and warmth noted over the left forefoot  Skin:  Wound dehiscence noted on left forefoot  Psychiatry:  Low mood   Data Reviewed: CBC: Recent Labs  Lab 12/17/19 1822 12/18/19 0820  WBC 5.3 4.7  NEUTROABS 3.1 3.1  HGB 9.9* 9.8*  HCT 31.4* 30.1*  MCV 94.0 91.2  PLT 185 295   Basic Metabolic Panel: Recent Labs  Lab 12/17/19 1822 12/18/19 0820  NA 136 142  K 4.0 4.1  CL 101 104  CO2 28 27  GLUCOSE 169* 119*  BUN 21* 13  CREATININE 0.92 0.87  CALCIUM 8.9 9.3   GFR: Estimated Creatinine Clearance: 135 mL/min (by C-G formula based on SCr of 0.87 mg/dL). Liver Function Tests: Recent Labs  Lab 12/17/19 1822  AST 17  ALT 11  ALKPHOS 102  BILITOT 0.9  PROT 7.5  ALBUMIN 3.5   No results for input(s): LIPASE, AMYLASE in the last 168 hours. No results for input(s): AMMONIA in the last 168 hours. Coagulation Profile: No results for input(s): INR, PROTIME in the last 168 hours. Cardiac Enzymes: No results for input(s): CKTOTAL, CKMB, CKMBINDEX, TROPONINI in the last 168 hours. BNP (last 3 results) No results for input(s): PROBNP in the last 8760 hours. HbA1C: No results for input(s): HGBA1C in the last 72 hours. CBG: Recent Labs  Lab 12/18/19 0003 12/18/19 0836 12/18/19 1146  GLUCAP 91 122* 135*   Lipid Profile: No results for input(s): CHOL, HDL, LDLCALC, TRIG, CHOLHDL, LDLDIRECT in the last 72 hours. Thyroid Function Tests: No results for input(s): TSH, T4TOTAL, FREET4, T3FREE, THYROIDAB in the last 72 hours. Anemia Panel: No results for input(s): VITAMINB12, FOLATE, FERRITIN, TIBC, IRON, RETICCTPCT in the last 72 hours. Urine analysis:    Component Value Date/Time    COLORURINE YELLOW 08/14/2019 [place] 08/14/2019 1232   LABSPEC 1.020 08/14/2019 1232   PHURINE 5.0 08/14/2019 [place] 08/14/2019 1232   HGBUR NEGATIVE 08/14/2019 [place] 08/14/2019 1232   KETONESUR NEGATIVE 08/14/2019 1232   PROTEINUR NEGATIVE 08/14/2019 1232   UROBILINOGEN 4.0 (H) 10/09/2008 1114   NITRITE NEGATIVE 08/14/2019 1232   LEUKOCYTESUR NEGATIVE 08/14/2019 1232   Sepsis Labs: '@LABRCNTIP'$ (procalcitonin:4,lacticidven:4)  ) Recent Results (from the past 240 hour(s))  Culture, blood (routine x 2)     Status: None (Preliminary result)   Collection Time: 12/17/19  6:22 PM   Specimen: BLOOD RIGHT FOREARM  Result Value Ref Range Status   Specimen Description BLOOD RIGHT FOREARM  Final   Special Requests   Final    BOTTLES DRAWN AEROBIC ONLY Blood Culture adequate volume   Culture   Final    NO GROWTH < 12 HOURS Performed at [facility], [address], [place], [place] [address]    Report Status PENDING  Incomplete  Culture, blood (routine x 2)     Status: None (Preliminary result)   Collection Time: 12/17/19  6:22 PM   Specimen: Right Antecubital; Blood  Result Value Ref Range  Status   Specimen Description RIGHT ANTECUBITAL  Final   Special Requests   Final    BOTTLES DRAWN AEROBIC AND ANAEROBIC Blood Culture adequate volume   Culture   Final    NO GROWTH < 12 HOURS Performed at [facility], [address], [place], [place] [address]    Report Status PENDING  Incomplete  Respiratory Panel by RT PCR (Flu A&B, Covid) - Nasopharyngeal Swab     Status: None   Collection Time: 12/17/19  7:42 PM   Specimen: Nasopharyngeal Swab  Result Value Ref Range Status   SARS Coronavirus 2 by RT PCR NEGATIVE NEGATIVE Final    Comment: (NOTE) SARS-CoV-2 target nucleic acids are NOT DETECTED. The SARS-CoV-2 RNA is generally detectable in upper respiratoy specimens during the acute phase of infection. The lowest concentration  of SARS-CoV-2 viral copies this assay can detect is 131 copies/mL. A negative result does not preclude SARS-Cov-2 infection and should not be used as the sole basis for treatment or other patient management decisions. A negative result may occur with  improper specimen collection/handling, submission of specimen other than nasopharyngeal swab, presence of viral mutation(s) within the areas targeted by this assay, and inadequate number of viral copies (<131 copies/mL). A negative result must be combined with clinical observations, patient history, and epidemiological information. The expected result is Negative. Fact Sheet for Patients:  PinkCheek.be Fact Sheet for Healthcare Providers:  GravelBags.it This test is not yet ap proved or cleared by the [place] FDA and  has been authorized for detection and/or diagnosis of SARS-CoV-2 by FDA under an Emergency Use Authorization (EUA). This EUA will remain  in effect (meaning this test can be used) for the duration of the COVID-19 declaration under Section 564(b)(1) of the Act, 21 U.S.C. section 360bbb-3(b)(1), unless the authorization is terminated or revoked sooner.    Influenza A by PCR NEGATIVE NEGATIVE Final   Influenza B by PCR NEGATIVE NEGATIVE Final    Comment: (NOTE) The Xpert Xpress SARS-CoV-2/FLU/RSV assay is intended as an aid in  the diagnosis of influenza from Nasopharyngeal swab specimens and  should not be used as a sole basis for treatment. Nasal washings and  aspirates are unacceptable for Xpert Xpress SARS-CoV-2/FLU/RSV  testing. Fact Sheet for Patients: PinkCheek.be Fact Sheet for Healthcare Providers: GravelBags.it This test is not yet approved or cleared by the [place] FDA and  has been authorized for detection and/or diagnosis of SARS-CoV-2 by  FDA under an Emergency Use Authorization (EUA).  This EUA will remain  in effect (meaning this test can be used) for the duration of the  Covid-19 declaration under Section 564(b)(1) of the Act, 21  U.S.C. section 360bbb-3(b)(1), unless the authorization is  terminated or revoked. Performed at [facility], [address] [place], [place] [address]       Studies: DG Foot Complete Left  Result Date: 12/17/2019 CLINICAL DATA:  Infection.  Rule out osteo. EXAM: LEFT FOOT - COMPLETE 3+ VIEW COMPARISON:  11/13/2019 FINDINGS: Midfoot amputation. Progression of extensive soft tissue swelling. Cortical erosions are seen in the cuneiform bones suggesting osteomyelitis. Possible erosive changes in the cuboid bone. Soft tissue calcification is present which appears dystrophic. IMPRESSION: Midfoot amputation. Progressive soft tissue swelling and cortical erosion of the cuneiforms and possibly cuboid bones compatible with osteomyelitis and soft tissue infection. Electronically Signed   By: Franchot Gallo M.D.   On: 12/17/2019 19:12    Scheduled Meds: . atorvastatin  40 mg Oral QPM  . furosemide  20 mg Oral Daily  . insulin aspart  0-15 Units Subcutaneous TID WC  . insulin aspart  0-5 Units Subcutaneous QHS  . lisinopril  20 mg Oral Daily  . pantoprazole  40 mg Oral Daily    Continuous Infusions: . piperacillin-tazobactam (ZOSYN)  IV 3.375 g (12/18/19 0357)  . vancomycin 1,500 mg (12/18/19 0930)     LOS: 1 day     Alma Friendly, MD [facility]  If 7PM-7AM, please contact night-coverage [email or web address] 12/18/2019, 1:57 PM

## 2019-12-18 NOTE — Transfer of Care (Signed)
Immediate Anesthesia Transfer of Care Note  Patient: Eugene Diaz  Procedure(s) Performed: LEFT FOOT DEBRIDEMENT WITH PARTIAL INCISION OF INFECTED BONE (Left )  Patient Location: PACU  Anesthesia Type:MAC  Level of Consciousness: awake, alert  and oriented  Airway & Oxygen Therapy: Patient Spontanous Breathing  Post-op Assessment: Report given to RN and Post -op Vital signs reviewed and stable  Post vital signs: Reviewed and stable  Last Vitals:  Vitals Value Taken Time  BP 157/79 12/18/19 1552  Temp    Pulse    Resp 10 12/18/19 1553  SpO2    Vitals shown include unvalidated device data.  Last Pain:  Vitals:   12/18/19 0800  TempSrc:   PainSc: 0-No pain         Complications: No apparent anesthesia complications

## 2019-12-18 NOTE — Telephone Encounter (Signed)
Angie from [facility] called last night concerning concerning this patient she said that he arrived at [facility] and had a history of having an amputation of his foot which she believes was now infected.  She requests that I call and talk with her.  Since this patient was a patient of [name] I sent the message to him for him to handle.  He told Forestine Na  that he does not have privileges at their hospital and the patient needs to be transferred to [facility] at [facility].  That would enable him to treat this patient at a hospital in which she has privileges.  I received another message stating that he had arrived at [facility] in [place] at 12:28 AM.  I proceeded to transfer this message also to [name].  [name] said he would handle this situation.   Gardiner Barefoot DPM

## 2019-12-18 NOTE — Telephone Encounter (Signed)
Spoke to patient's sister Eugene Diaz per patient's request. Had lengthy conversation about condition and prognosis. Discussed the barriers to healing that patient experiences and that due to his history of non-compliance he would benefit from admission to a rehab facility at discharge. Eugene Diaz is in agreement. Discussed this with patient who seems to agree. Will further discuss. All other questions she had answered. Asked that she call if she has any further questions or concerns.

## 2019-12-18 NOTE — Anesthesia Procedure Notes (Signed)
Procedure Name: MAC Date/Time: 12/18/2019 2:47 PM Performed by: Candis Shine, CRNA Pre-anesthesia Checklist: Patient identified, Emergency Drugs available, Suction available, Patient being monitored and Timeout performed Patient Re-evaluated:Patient Re-evaluated prior to induction Oxygen Delivery Method: Simple face mask Dental Injury: Teeth and Oropharynx as per pre-operative assessment

## 2019-12-18 NOTE — Op Note (Signed)
Patient Name: MARVEN VELEY DOB: 1966/09/09  MRN: [id number]   Date of Service: 12/18/2019  Surgeon: Dr. Hardie Pulley, DPM Assistants: None Pre-operative Diagnosis:  Wound dehiscence, osteomyelitis Post-operative Diagnosis:  Same Procedures:  1) wound debridement left foot  2) partial excision of cuboid bone  3) partial excision of the lateral cuneiform bone Pathology/Specimens: ID Type Source Tests Collected by Time Destination  A : left foot wound Tissue PATH Other GRAM STAIN (Canceled), AEROBIC/ANAEROBIC CULTURE (SURGICAL/DEEP WOUND) Evelina Bucy, DPM 12/18/2019 1456   B : left foot bone Tissue Other Source GRAM STAIN (Canceled), AEROBIC/ANAEROBIC CULTURE (SURGICAL/DEEP WOUND) Evelina Bucy, DPM 12/18/2019 1508    Anesthesia: MAC local Hemostasis:  Total Tourniquet Time Documented: Calf (Left) - 22 minutes Total: Calf (Left) - 22 minutes  Estimated Blood Loss: 40 mL Materials: * No implants in log * Medications: 1 g vancomycin powder Complications: None  Indications for Procedure:  This is a 54 y.o. male with a chronic wound to the left foot.  He previously underwent Lisfranc amputation.  The postop course was complicated by noncompliance.  He presented to the ED with signs of wound infection and osteomyelitis.  Discussed to benefit from wound debridement to evaluate the extent of infection and viability of salvage of the limb.  Procedure in Detail: Patient was identified in pre-operative holding area. Formal consent was signed and the left lower extremity was marked. Patient was brought back to the operating room. Anesthesia was induced. The extremity was prepped and draped in the usual sterile fashion. Timeout was taken to confirm patient name, laterality, and procedure prior to incision.    Attention was then directed to the lateral foot wound where there was wound dehiscence purulence noted.  The wound probes deep to the bone.  Deep swab was taken of the wound.   Incision was then sharply excisionally debrided with a 15 blade down to level of the bone.  There was loose fragmentation of bone particles which were sharply excised.  There was gross infection of the lateral cuneiforms and cuboid.  These were sharply partially excised with rongeur.  A bone culture was taken of this area for microbiology.  The wound was then copiously thoroughly irrigated with pulse lavage for a total of 3 L.  Following thorough debridement the wound measured 8 x 4.5x 4cm. A wound VAC was applied to the wound however this was removed due to excessive suctioning of blood and concern for continued bleeding.  The foot was then packed open with Surgicel 4 x 4 Kerlix ABD Ace bandage. Patient tolerated the procedure well.   Disposition: Following a period of post-operative monitoring, patient will be transferred back to the floor.  Based on the findings today I do believe the limb is salvageable with strict compliance to postoperative care.  He will benefit from return to the operating room for likely [place] amputation should he not elect for below-knee amputation.  He would benefit from skilled nursing facility to better assist in self-care.

## 2019-12-18 NOTE — Consult Note (Signed)
Reason for Consult:Left foot infection Referring Physician: Daejon Abdelaziz is an 54 y.o. male.  HPI: Patient well known to [facility] - has chronic left foot wound, s/p amputation 11/13/19. Post-op course complicated by non-compliance. Patient has been educated multiple times on the importance of wearing his boot and not putting pressure on his foot but continues to admit walking on his foot. Was evaluated by [facility] who told him to go to the ED for possible infection. Missed last appointments 2/4 (canceled) and 2/11 (no -show) - patient states most recent appt he had the day mixed up and called on Saturday to ask what time his appt was when his appt was Friday.   No solid food today. Last drank a few minutes ago.  Past Medical History:  Diagnosis Date  . Alcohol abuse   . Alcoholic peripheral neuropathy ([place])   . Anemia   . Anxiety   . Arthritis   . Asthma    followed by pcp  . B12 deficiency   . Cellulitis of left lower leg    07-2019 and left foot  . Depression   . Diabetic neuropathy ([place])   . Essential tremor    neurologist-- dr patel--- due to alcohol abuse  . GERD (gastroesophageal reflux disease)   . History of esophageal stricture    s/p  dilatation 02-13-2018  . Hypercholesterolemia   . Hypertension    followed by pcp  (nuclear stress test 03-11-2014 low risk w/ no ischemia, ef 65%  . Osteomyelitis of great toe ([place])    left foot s/p amputation 08-14-2019  . Peripheral vascular disease ([place])   . Thrombocytopenia ([facility])    chronic  . Type 2 diabetes mellitus ([place])    followed by pcp--- last A1c in epic 6.6 on 07-12-2019  . Wound dehiscence, surgical    post left great toe amputation 08-14-2019    Past Surgical History:  Procedure Laterality Date  . AMPUTATION Left 10/16/2019   Procedure: Left partial second ray resection; placement of antibiotic beads;  Surgeon: Evelina Bucy, DPM;  [facility];  Service: Podiatry;  Laterality: Left;  . AMPUTATION Left  11/13/2019   Procedure: Left Midfoot Amputation - Transmetatarsal vs. Lisfranc; Placement antibiotic beads;  Surgeon: Evelina Bucy, DPM;  [facility];  Service: Podiatry;  Laterality: Left;  . AMPUTATION TOE Left 08/14/2019   Procedure: AMPUTATION GREAT TOE;  Surgeon: Evelina Bucy, DPM;  [facility];  Service: Podiatry;  Laterality: Left;  . BIOPSY  02/13/2018   Procedure: BIOPSY;  Surgeon: Danie Binder, MD;  [facility];  Service: Endoscopy;;  transverse colon biopsy, gastric biopsy  . COLONOSCOPY WITH PROPOFOL N/A 02/13/2018   Procedure: COLONOSCOPY WITH PROPOFOL;  Surgeon: Danie Binder, MD;  [facility];  Service: Endoscopy;  Laterality: N/A;  11:15am  . ESOPHAGOGASTRODUODENOSCOPY (EGD) WITH PROPOFOL N/A 02/13/2018   Procedure: ESOPHAGOGASTRODUODENOSCOPY (EGD) WITH PROPOFOL;  Surgeon: Danie Binder, MD;  [facility];  Service: Endoscopy;  Laterality: N/A;  . I & D EXTREMITY Left 07/16/2019   Procedure: Insicion  AND DEBRIDEMENT EXTREMITY;  Surgeon: Evelina Bucy, DPM;  Location: [place];  Service: Podiatry;  Laterality: Left;  . INCISION AND DRAINAGE OF WOUND Left 10/13/2019   Procedure: IRRIGATION AND DEBRIDEMENT WOUND OF LEFT FOOT AND FIRST METATARSAL RESECTION;  Surgeon: Evelina Bucy, DPM;  [facility];  Service: Podiatry;  Laterality: Left;  . IRRIGATION AND DEBRIDEMENT FOOT Left 11/11/2019  Procedure: Left Foot Wound Irrigation and Debridement;  Surgeon: Evelina Bucy, DPM;  [facility];  Service: Podiatry;  Laterality: Left;  . Left arm     Left arm repair (tendon and artery)  . PERCUTANEOUS PINNING  07/16/2019   Procedure: Open Reduction Percutaneous Pinning Extremity;  Surgeon: Evelina Bucy, DPM;  Location: [place];  Service: Podiatry;;  . PILONIDAL CYST EXCISION N/A 08/08/2014   Procedure: CYST EXCISION PILONIDAL EXTENSIVE;  Surgeon: Jamesetta So, MD;  [facility];  Service: General;  Laterality: N/A;   . POLYPECTOMY  02/13/2018   Procedure: POLYPECTOMY;  Surgeon: Danie Binder, MD;  [facility];  Service: Endoscopy;;  transverse colon polyp hs, rectal polyps times 2  . SAVORY DILATION N/A 02/13/2018   Procedure: SAVORY DILATION;  Surgeon: Danie Binder, MD;  [facility];  Service: Endoscopy;  Laterality: N/A;  . WOUND DEBRIDEMENT Left 09/04/2019   Procedure: DEBRIDEMENT WOUND WITH COMPLEX  REPAIR OF DEHISCENCE;  Surgeon: Evelina Bucy, DPM;  Location: [place];  Service: Podiatry;  Laterality: Left;  Leave patient on stretcher  . WOUND DEBRIDEMENT Left 10/16/2019   Procedure: Left foot wound debridement and closure;  Surgeon: Evelina Bucy, DPM;  [facility];  Service: Podiatry;  Laterality: Left;    Family History  Problem Relation Age of Onset  . Heart attack Mother        Living, 90  . Cancer Father        Deceased  . Healthy Brother   . Healthy Sister   . Colon cancer Neg Hx   . Colon polyps Neg Hx     Social History:  reports that he quit smoking about 35 years ago. His smoking use included cigarettes. He has a 62.50 pack-year smoking history. He has never used smokeless tobacco. He reports current alcohol use of about 50.0 standard drinks of alcohol per week. He reports current drug use. Drug: Marijuana.  Allergies: No Known Allergies  Medications: I have reviewed the patient's current medications.  Results for orders placed or performed during the hospital encounter of 12/17/19 (from the past 48 hour(s))  Comprehensive metabolic panel     Status: Abnormal   Collection Time: 12/17/19  6:22 PM  Result Value Ref Range   Sodium 136 135 - 145 mmol/L   Potassium 4.0 3.5 - 5.1 mmol/L   Chloride 101 98 - 111 mmol/L   CO2 28 22 - 32 mmol/L   Glucose, Bld 169 (H) 70 - 99 mg/dL   BUN 21 (H) 6 - 20 mg/dL   Creatinine, Ser 0.92 0.61 - 1.24 mg/dL   Calcium 8.9 8.9 - 10.3 mg/dL   Total Protein 7.5 6.5 - 8.1 g/dL   Albumin 3.5  3.5 - 5.0 g/dL   AST 17 15 - 41 U/L   ALT 11 0 - 44 U/L   Alkaline Phosphatase 102 38 - 126 U/L   Total Bilirubin 0.9 0.3 - 1.2 mg/dL   GFR calc non Af Amer >60 >60 mL/min   GFR calc Af Amer >60 >60 mL/min   Anion gap 7 5 - 15    Comment: Performed at [facility], [address], [place], [place] [address]  CBC with Differential     Status: Abnormal   Collection Time: 12/17/19  6:22 PM  Result Value Ref Range   WBC 5.3 4.0 - 10.5 K/uL   RBC 3.34 (L) 4.22 - 5.81 MIL/uL  Hemoglobin 9.9 (L) 13.0 - 17.0 g/dL   HCT 31.4 (L) 39.0 - 52.0 %   MCV 94.0 80.0 - 100.0 fL   MCH 29.6 26.0 - 34.0 pg   MCHC 31.5 30.0 - 36.0 g/dL   RDW 17.0 (H) 11.5 - 15.5 %   Platelets 185 150 - 400 K/uL   nRBC 0.0 0.0 - 0.2 %   Neutrophils Relative % 60 %   Neutro Abs 3.1 1.7 - 7.7 K/uL   Lymphocytes Relative 27 %   Lymphs Abs 1.4 0.7 - 4.0 K/uL   Monocytes Relative 5 %   Monocytes Absolute 0.3 0.1 - 1.0 K/uL   Eosinophils Relative 6 %   Eosinophils Absolute 0.3 0.0 - 0.5 K/uL   Basophils Relative 1 %   Basophils Absolute 0.1 0.0 - 0.1 K/uL   Immature Granulocytes 1 %   Abs Immature Granulocytes 0.05 0.00 - 0.07 K/uL    Comment: Performed at [facility], [address] [place], [place] [address]  Lactic acid, plasma     Status: None   Collection Time: 12/17/19  6:22 PM  Result Value Ref Range   Lactic Acid, Venous 1.6 0.5 - 1.9 mmol/L    Comment: Performed at [facility], [address], [place], [place] [address]  Culture, blood (routine x 2)     Status: None (Preliminary result)   Collection Time: 12/17/19  6:22 PM   Specimen: BLOOD RIGHT FOREARM  Result Value Ref Range   Specimen Description BLOOD RIGHT FOREARM    Special Requests      BOTTLES DRAWN AEROBIC ONLY Blood Culture adequate volume   Culture      NO GROWTH < 12 HOURS Performed at [facility], [address], [place], [place] [address]    Report Status PENDING   Culture, blood (routine x 2)     Status: None (Preliminary result)    Collection Time: 12/17/19  6:22 PM   Specimen: Right Antecubital; Blood  Result Value Ref Range   Specimen Description RIGHT ANTECUBITAL    Special Requests      BOTTLES DRAWN AEROBIC AND ANAEROBIC Blood Culture adequate volume   Culture      NO GROWTH < 12 HOURS Performed at [facility], [address] [place], [place] [address]    Report Status PENDING   Respiratory Panel by RT PCR (Flu A&B, Covid) - Nasopharyngeal Swab     Status: None   Collection Time: 12/17/19  7:42 PM   Specimen: Nasopharyngeal Swab  Result Value Ref Range   SARS Coronavirus 2 by RT PCR NEGATIVE NEGATIVE    Comment: (NOTE) SARS-CoV-2 target nucleic acids are NOT DETECTED. The SARS-CoV-2 RNA is generally detectable in upper respiratoy specimens during the acute phase of infection. The lowest concentration of SARS-CoV-2 viral copies this assay can detect is 131 copies/mL. A negative result does not preclude SARS-Cov-2 infection and should not be used as the sole basis for treatment or other patient management decisions. A negative result may occur with  improper specimen collection/handling, submission of specimen other than nasopharyngeal swab, presence of viral mutation(s) within the areas targeted by this assay, and inadequate number of viral copies (<131 copies/mL). A negative result must be combined with clinical observations, patient history, and epidemiological information. The expected result is Negative. Fact Sheet for Patients:  PinkCheek.be Fact Sheet for Healthcare Providers:  GravelBags.it This test is not yet ap proved or cleared by the [place] FDA and  has been authorized for detection  and/or diagnosis of SARS-CoV-2 by FDA under an Emergency Use Authorization (EUA). This EUA will remain  in effect (meaning this test can be used) for the duration of the COVID-19 declaration under Section 564(b)(1) of the Act, 21 U.S.C. section  360bbb-3(b)(1), unless the authorization is terminated or revoked sooner.    Influenza A by PCR NEGATIVE NEGATIVE   Influenza B by PCR NEGATIVE NEGATIVE    Comment: (NOTE) The Xpert Xpress SARS-CoV-2/FLU/RSV assay is intended as an aid in  the diagnosis of influenza from Nasopharyngeal swab specimens and  should not be used as a sole basis for treatment. Nasal washings and  aspirates are unacceptable for Xpert Xpress SARS-CoV-2/FLU/RSV  testing. Fact Sheet for Patients: PinkCheek.be Fact Sheet for Healthcare Providers: GravelBags.it This test is not yet approved or cleared by the [place] FDA and  has been authorized for detection and/or diagnosis of SARS-CoV-2 by  FDA under an Emergency Use Authorization (EUA). This EUA will remain  in effect (meaning this test can be used) for the duration of the  Covid-19 declaration under Section 564(b)(1) of the Act, 21  U.S.C. section 360bbb-3(b)(1), unless the authorization is  terminated or revoked. Performed at [facility], [address], [place], [place] [address]   Glucose, capillary     Status: None   Collection Time: 12/18/19 12:03 AM  Result Value Ref Range   Glucose-Capillary 91 70 - 99 mg/dL  CBC with Differential/Platelet     Status: Abnormal   Collection Time: 12/18/19  8:20 AM  Result Value Ref Range   WBC 4.7 4.0 - 10.5 K/uL   RBC 3.30 (L) 4.22 - 5.81 MIL/uL   Hemoglobin 9.8 (L) 13.0 - 17.0 g/dL   HCT 30.1 (L) 39.0 - 52.0 %   MCV 91.2 80.0 - 100.0 fL   MCH 29.7 26.0 - 34.0 pg   MCHC 32.6 30.0 - 36.0 g/dL   RDW 16.7 (H) 11.5 - 15.5 %   Platelets 177 150 - 400 K/uL   nRBC 0.0 0.0 - 0.2 %   Neutrophils Relative % 65 %   Neutro Abs 3.1 1.7 - 7.7 K/uL   Lymphocytes Relative 22 %   Lymphs Abs 1.1 0.7 - 4.0 K/uL   Monocytes Relative 5 %   Monocytes Absolute 0.2 0.1 - 1.0 K/uL   Eosinophils Relative 6 %   Eosinophils Absolute 0.3 0.0 - 0.5 K/uL   Basophils  Relative 1 %   Basophils Absolute 0.1 0.0 - 0.1 K/uL   Immature Granulocytes 1 %   Abs Immature Granulocytes 0.04 0.00 - 0.07 K/uL    Comment: Performed at [facility], [address] [place], [place] [id number]  Basic metabolic panel     Status: Abnormal   Collection Time: 12/18/19  8:20 AM  Result Value Ref Range   Sodium 142 135 - 145 mmol/L   Potassium 4.1 3.5 - 5.1 mmol/L   Chloride 104 98 - 111 mmol/L   CO2 27 22 - 32 mmol/L   Glucose, Bld 119 (H) 70 - 99 mg/dL   BUN 13 6 - 20 mg/dL   Creatinine, Ser 0.87 0.61 - 1.24 mg/dL   Calcium 9.3 8.9 - 10.3 mg/dL   GFR calc non Af Amer >60 >60 mL/min   GFR calc Af Amer >60 >60 mL/min   Anion gap 11 5 - 15    Comment: Performed at [facility] [address], [place], [place] [address]  Glucose, capillary  Status: Abnormal   Collection Time: 12/18/19  8:36 AM  Result Value Ref Range   Glucose-Capillary 122 (H) 70 - 99 mg/dL    DG Foot Complete Left  Result Date: 12/17/2019 CLINICAL DATA:  Infection.  Rule out osteo. EXAM: LEFT FOOT - COMPLETE 3+ VIEW COMPARISON:  11/13/2019 FINDINGS: Midfoot amputation. Progression of extensive soft tissue swelling. Cortical erosions are seen in the cuneiform bones suggesting osteomyelitis. Possible erosive changes in the cuboid bone. Soft tissue calcification is present which appears dystrophic. IMPRESSION: Midfoot amputation. Progressive soft tissue swelling and cortical erosion of the cuneiforms and possibly cuboid bones compatible with osteomyelitis and soft tissue infection. Electronically Signed   By: Franchot Gallo M.D.   On: 12/17/2019 19:12    ROS Blood pressure (!) 160/80, pulse 70, temperature 98.7 F (37.1 C), temperature source Oral, resp. rate 16, height 6\' 2"  (1.88 m), weight 122.5 kg, SpO2 100 %.  Vitals:   12/17/19 2357 12/18/19 0920  BP: (!) 185/92 (!) 160/80  Pulse: 85 70  Resp: 16   Temp: 98.7 F (37.1 C)   SpO2: 100%     General AA&O x3. Normal mood and  affect.  Vascular Left foot warm and well perfused.  Neurologic Epicritic sensation grossly absent.  Dermatologic (Wound) Wound Location: Lt. foot lateral Wound Base: Fibrotic slough, necrotic Peri-wound: Macerated Exudate: Moderate amount Purulent exudate  Orthopedic: Midfoot amputation noted left    Assessment/Plan:  Left foot wound dehiscence and infection -Imaging: Studies independently reviewed -Antibiotics: continue empiric -WB Status: NWB LLE -Surgical Plan: OR today for wound debridement, VAC application  Evelina Bucy 12/18/2019, 9:52 AM   Best available via secure chat for questions or concerns.

## 2019-12-18 NOTE — Brief Op Note (Signed)
12/18/2019  4:11 PM  PATIENT:  Eugene Diaz  54 y.o. male  PRE-OPERATIVE DIAGNOSIS:  wound infection left foot  POST-OPERATIVE DIAGNOSIS:  wound infection left foot  PROCEDURE:  Procedure(s) with comments: LEFT FOOT DEBRIDEMENT WITH PARTIAL INCISION OF INFECTED BONE (Left) - LEFT FOOT DEBRIDEMENT WITH PARTIAL INCISION OF INFECTED BONE  SURGEON:  Surgeon(s) and Role:    * Evelina Bucy, DPM - Primary  PHYSICIAN ASSISTANT:   ASSISTANTS: none   ANESTHESIA:   MAC/local  EBL:  40 mL   BLOOD ADMINISTERED:none  DRAINS: none   LOCAL MEDICATIONS USED:  MARCAINE    and Amount: 10 ml  SPECIMEN:   ID Type Source Tests Collected by Time Destination  A : left foot wound Tissue PATH Other GRAM STAIN (Canceled), AEROBIC/ANAEROBIC CULTURE (SURGICAL/DEEP WOUND) Evelina Bucy, DPM 12/18/2019 1456   B : left foot bone Tissue Other Source GRAM STAIN (Canceled), AEROBIC/ANAEROBIC CULTURE (SURGICAL/DEEP WOUND) Evelina Bucy, DPM 12/18/2019 1508      COUNTS:  YES  TOURNIQUET:   Total Tourniquet Time Documented: Calf (Left) - 22 minutes Total: Calf (Left) - 22 minutes   DICTATION: .Note written in EPIC  PLAN OF CARE: transfer to floor  PATIENT DISPOSITION:  PACU - hemodynamically stable.   Delay start of Pharmacological VTE agent (>24hrs) due to surgical blood loss or risk of bleeding: not applicable

## 2019-12-18 NOTE — Plan of Care (Signed)

## 2019-12-19 ENCOUNTER — Ambulatory Visit: Payer: Medicaid Other | Admitting: Podiatry

## 2019-12-19 LAB — VITAMIN B12: Vitamin B-12: 84 pg/mL — ABNORMAL LOW (ref 180–914)

## 2019-12-19 LAB — BASIC METABOLIC PANEL
Anion gap: 10 (ref 5–15)
BUN: 11 mg/dL (ref 6–20)
CO2: 25 mmol/L (ref 22–32)
Calcium: 9 mg/dL (ref 8.9–10.3)
Chloride: 103 mmol/L (ref 98–111)
Creatinine, Ser: 0.9 mg/dL (ref 0.61–1.24)
GFR calc Af Amer: >60 mL/min (ref >60)
GFR calc non Af Amer: >60 mL/min (ref >60)
Glucose, Bld: 136 mg/dL — ABNORMAL HIGH (ref 70–99)
Potassium: 3.7 mmol/L (ref 3.5–5.1)
Sodium: 138 mmol/L (ref 135–145)

## 2019-12-19 LAB — CBC WITH DIFFERENTIAL/PLATELET
Abs Immature Granulocytes: 0.05 10*3/uL (ref 0.00–0.07)
Basophils Absolute: 0 10*3/uL (ref 0.0–0.1)
Basophils Relative: 1 %
Eosinophils Absolute: 0.3 10*3/uL (ref 0.0–0.5)
Eosinophils Relative: 4 %
HCT: 29.9 % — ABNORMAL LOW (ref 39.0–52.0)
Hemoglobin: 9.5 g/dL — ABNORMAL LOW (ref 13.0–17.0)
Immature Granulocytes: 1 %
Lymphocytes Relative: 18 %
Lymphs Abs: 1.3 10*3/uL (ref 0.7–4.0)
MCH: 29.1 pg (ref 26.0–34.0)
MCHC: 31.8 g/dL (ref 30.0–36.0)
MCV: 91.4 fL (ref 80.0–100.0)
Monocytes Absolute: 0.4 10*3/uL (ref 0.1–1.0)
Monocytes Relative: 5 %
Neutro Abs: 5.2 10*3/uL (ref 1.7–7.7)
Neutrophils Relative %: 71 %
Platelets: 184 10*3/uL (ref 150–400)
RBC: 3.27 MIL/uL — ABNORMAL LOW (ref 4.22–5.81)
RDW: 16.8 % — ABNORMAL HIGH (ref 11.5–15.5)
WBC: 7.2 10*3/uL (ref 4.0–10.5)
nRBC: 0 % (ref 0.0–0.2)

## 2019-12-19 LAB — IRON AND TIBC
Iron: 49 ug/dL (ref 45–182)
Saturation Ratios: 15 % — ABNORMAL LOW (ref 17.9–39.5)
TIBC: 322 ug/dL (ref 250–450)
UIBC: 273 ug/dL

## 2019-12-19 LAB — GLUCOSE, CAPILLARY
Glucose-Capillary: 121 mg/dL — ABNORMAL HIGH (ref 70–99)
Glucose-Capillary: 122 mg/dL — ABNORMAL HIGH (ref 70–99)
Glucose-Capillary: 180 mg/dL — ABNORMAL HIGH (ref 70–99)
Glucose-Capillary: 160 mg/dL — ABNORMAL HIGH (ref 70–99)

## 2019-12-19 LAB — FERRITIN: Ferritin: 91 ng/mL (ref 24–336)

## 2019-12-19 LAB — FOLATE: Folate: 14.7 ng/mL (ref >5.9)

## 2019-12-19 MED ORDER — VITAMIN B-12 1000 MCG PO TABS
1000.0000 ug | ORAL_TABLET | Freq: Every day | ORAL | Status: DC
  Administered 2019-12-19 – 2019-12-27 (×9): 1000 ug via ORAL
  Filled 2019-12-19 (×9): qty 1

## 2019-12-19 NOTE — Progress Notes (Signed)
Subjective:  Patient ID: CHIBUIKEM PALL, male    DOB: 05-15-1966,  MRN: [id number]  Patient seen bedside. Somnolent. Denies pain. Denies other issues. Objective:   Vitals:   12/19/19 0337 12/19/19 0752  BP: (!) 152/77 (!) 153/86  Pulse: 77 73  Resp: 18 17  Temp: 98 F (36.7 C) 97.8 F (36.6 C)  SpO2: 96% 97%   Left foot dressing c/d/i Calf supple, nontender No proximal erythema noted.  Results for orders placed or performed during the hospital encounter of 12/17/19 (from the past 24 hour(s))  Glucose, capillary     Status: Abnormal   Collection Time: 12/18/19 11:46 AM  Result Value Ref Range   Glucose-Capillary 135 (H) 70 - 99 mg/dL  Aerobic/Anaerobic Culture (surgical/deep wound)     Status: None (Preliminary result)   Collection Time: 12/18/19  2:56 PM   Specimen: PATH Other; Tissue  Result Value Ref Range   Specimen Description WOUND LEFT FOOT    Special Requests PT ON ZOSYN VANC    Gram Stain      RARE WBC PRESENT, PREDOMINANTLY PMN ABUNDANT GRAM POSITIVE COCCI ABUNDANT GRAM NEGATIVE RODS    Culture      CULTURE REINCUBATED FOR BETTER GROWTH Performed at [facility], [place] [address] [place], [place] [address]    Report Status PENDING   Aerobic/Anaerobic Culture (surgical/deep wound)     Status: None (Preliminary result)   Collection Time: 12/18/19  3:08 PM   Specimen: Other Source; Tissue  Result Value Ref Range   Specimen Description BONE LEFT FOOT    Special Requests PT ON ZOSYN VANC    Gram Stain      RARE WBC PRESENT, PREDOMINANTLY MONONUCLEAR FEW GRAM POSITIVE COCCI    Culture      CULTURE REINCUBATED FOR BETTER GROWTH Performed at [facility], [place] [address] [place], [place] [address]    Report Status PENDING   Glucose, capillary     Status: Abnormal   Collection Time: 12/18/19  3:57 PM  Result Value Ref Range   Glucose-Capillary 131 (H) 70 - 99 mg/dL  Glucose, capillary     Status: Abnormal   Collection Time: 12/18/19  4:27 PM    Result Value Ref Range   Glucose-Capillary 122 (H) 70 - 99 mg/dL  Glucose, capillary     Status: Abnormal   Collection Time: 12/18/19  8:30 PM  Result Value Ref Range   Glucose-Capillary 252 (H) 70 - 99 mg/dL  CBC with Differential/Platelet     Status: Abnormal   Collection Time: 12/19/19  1:36 AM  Result Value Ref Range   WBC 7.2 4.0 - 10.5 K/uL   RBC 3.27 (L) 4.22 - 5.81 MIL/uL   Hemoglobin 9.5 (L) 13.0 - 17.0 g/dL   HCT 29.9 (L) 39.0 - 52.0 %   MCV 91.4 80.0 - 100.0 fL   MCH 29.1 26.0 - 34.0 pg   MCHC 31.8 30.0 - 36.0 g/dL   RDW 16.8 (H) 11.5 - 15.5 %   Platelets 184 150 - 400 K/uL   nRBC 0.0 0.0 - 0.2 %   Neutrophils Relative % 71 %   Neutro Abs 5.2 1.7 - 7.7 K/uL   Lymphocytes Relative 18 %   Lymphs Abs 1.3 0.7 - 4.0 K/uL   Monocytes Relative 5 %   Monocytes Absolute 0.4 0.1 - 1.0 K/uL   Eosinophils Relative 4 %   Eosinophils Absolute 0.3 0.0 - 0.5 K/uL   Basophils Relative 1 %  Basophils Absolute 0.0 0.0 - 0.1 K/uL   Immature Granulocytes 1 %   Abs Immature Granulocytes 0.05 0.00 - 0.07 K/uL  Basic metabolic panel     Status: Abnormal   Collection Time: 12/19/19  1:36 AM  Result Value Ref Range   Sodium 138 135 - 145 mmol/L   Potassium 3.7 3.5 - 5.1 mmol/L   Chloride 103 98 - 111 mmol/L   CO2 25 22 - 32 mmol/L   Glucose, Bld 136 (H) 70 - 99 mg/dL   BUN 11 6 - 20 mg/dL   Creatinine, Ser 0.90 0.61 - 1.24 mg/dL   Calcium 9.0 8.9 - 10.3 mg/dL   GFR calc non Af Amer >60 >60 mL/min   GFR calc Af Amer >60 >60 mL/min   Anion gap 10 5 - 15  Vitamin B12     Status: Abnormal   Collection Time: 12/19/19  1:36 AM  Result Value Ref Range   Vitamin B-12 84 (L) 180 - 914 pg/mL  Folate     Status: None   Collection Time: 12/19/19  1:36 AM  Result Value Ref Range   Folate 14.7 >5.9 ng/mL  Iron and TIBC     Status: Abnormal   Collection Time: 12/19/19  1:36 AM  Result Value Ref Range   Iron 49 45 - 182 ug/dL   TIBC 322 250 - 450 ug/dL   Saturation Ratios 15 (L) 17.9 -  39.5 %   UIBC 273 ug/dL  Ferritin     Status: None   Collection Time: 12/19/19  1:36 AM  Result Value Ref Range   Ferritin 91 24 - 336 ng/mL  Glucose, capillary     Status: Abnormal   Collection Time: 12/19/19  6:33 AM  Result Value Ref Range   Glucose-Capillary 121 (H) 70 - 99 mg/dL     Assessment & Plan:  Patient was evaluated and treated and all questions answered.  Left foot wound dehiscence, osteomyelitis -S/p debridement and irrigation -Bone and ST cultures pending, GPC on GS -Will plan for RTOR Saturday for conversion to [place] amputation as last attempt at salvage per patient request. Should this fail patient would benefit from likely below knee amputation -Would benefit from rehab placement at d/c.  -Will continue to follow.  Evelina Bucy, DPM  Accessible via secure chat for questions or concerns.

## 2019-12-19 NOTE — Progress Notes (Signed)
PROGRESS NOTE  Eugene Diaz [id number] DOB: 05/24/1966 DOA: 12/17/2019 PCP: [name], PA-C  HPI/Recap of past 24 hours: HPI from [name] is a 53 y.o. male with medical history significant for DM, GERD, peripheral neuropathy, left wound requiring midfoot Lisfranc amputation with [name] on November 13, 2019 presents to the emergency department with concern of wound infection.  Patient has a wound nurse who sees patient on Monday/Wednesday/Friday every other week for wound dressing changes. Pt noted to have foul-smelling draining discharge with redness extending up the leg, asked to go to the ED for further evaluation. In the ED, BP was elevated at 174/78, but other vital signs were stable. Labs showed normocytic anemia and hyperglycemia.  Left foot x-ray showed midfoot amputation with progressive soft tissue swelling and cortical erosion of the cuneiforms and possibly cuboid bones compatible with osteomyelitis and soft tissue infection. Patient's podiatrist ([name]) was consulted by EDP and recommended starting patient with IV vancomycin and Zosyn with plan to admit patient to hospitalist service at [facility].    Today, pt denies any new complaints, reports pain is controlled with meds. Still quite teary/sobbing due to the loss of his wife, denies any SI/HI, denies any chest pain, SOB, abdominal pain, N/V,diarrhea, fever/chills     Assessment/Plan: Principal Problem:   Osteomyelitis of ankle or foot, acute, left (HCC) Active Problems:   Diabetes mellitus without complication (HCC)   Hypertension   Hypercholesterolemia   Diabetic neuropathy (HCC)   GERD (gastroesophageal reflux disease)   Diabetic infection of left foot (HCC)   Anemia, chronic disease   Hyperglycemia   Left foot wound dehiscence and infection/Osteomyelitis s/p debridement on 12/18/19 Currently afebrile with no leukocytosis BC x2 NGTD X-ray of left foot showed progressive soft tissue  swelling and cortical erosion compatible with osteomyelitis and soft tissue infection Bone culture pending, GPC on Gram stain Podiatry, [name], on board, plan for conversion to chopart amputation as last attempt to salvage the limb, if fails would benefit from BKA Continue empiric Vanco and Zosyn Wound care PT/OT Daily CBC  Chronic normocytic anemia/iron deficiency Likely 2/2 chronic disease Hemoglobin around baseline Anemia panel showed iron 49, sats 15, ferritin 91, folate 14.7, vitamin B12 84 Continue oral iron supplementation, start vitamin B12 supplementation Daily CBC  Diabetes mellitus type 2/neuropathy Last A1c 6.4 on 11/10/2019 SSI, Accu-Cheks, hypoglycemic protocol Continue gabapentin Hold home glimepiride  Hypertension Continue lisinopril, Lasix per home regimen  Hyperlipidemia Continue statins  GERD Continue Protonix  Anxiety/depression/grieving Denies any SI/HI Continue Xanax, Cymbalta, hydroxyzine Refusing psychiatry/mental health consultation  Obesity Lifestyle modification advised       Malnutrition Type:      Malnutrition Characteristics:      Nutrition Interventions:       Estimated body mass index is 34.66 kg/m as calculated from the following:   Height as of this encounter: 6' 2.02" (1.88 m).   Weight as of this encounter: 122.5 kg.     Code Status: Full  Family Communication: None at bedside  Disposition Plan: Likely home once work-up complete/consultant signing off   Consultants:  Podiatry, [name]  Procedures:  Wound debridement  Antimicrobials:  Vancomycin  Zosyn  DVT prophylaxis: SCD-due to pending surgery   Objective: Vitals:   12/18/19 2030 12/19/19 0337 12/19/19 0752 12/19/19 1458  BP: (!) 151/74 (!) 152/77 (!) 153/86 (!) 150/86  Pulse: 92 77 73 76  Resp: 17 18 17 18   Temp: 99.3 F (37.4 C)  98 F (36.7 C) 97.8 F (36.6 C) 98.2 F (36.8 C)  TempSrc: Oral  Oral Oral  SpO2: 97% 96% 97% 96%   Weight:      Height:        Intake/Output Summary (Last 24 hours) at 12/19/2019 1535 Last data filed at 12/19/2019 0951 Gross per 24 hour  Intake 1152.76 ml  Output 1770 ml  Net -617.24 ml   Filed Weights   12/17/19 2210 12/18/19 0000 12/18/19 1428  Weight: 122.5 kg 122.5 kg 122.5 kg    Exam:  General: NAD, sobbing/teary  Cardiovascular: S1, S2 present  Respiratory: CTAB  Abdomen: Soft, nontender, nondistended, bowel sounds present  Musculoskeletal: No bilateral pedal edema noted, left foot dressing C/D/I  Skin:  Wound dehiscence noted on left forefoot  Psychiatry:  Low mood   Data Reviewed: CBC: Recent Labs  Lab 12/17/19 1822 12/18/19 0820 12/19/19 0136  WBC 5.3 4.7 7.2  NEUTROABS 3.1 3.1 5.2  HGB 9.9* 9.8* 9.5*  HCT 31.4* 30.1* 29.9*  MCV 94.0 91.2 91.4  PLT 185 177 [id number]   Basic Metabolic Panel: Recent Labs  Lab 12/17/19 1822 12/18/19 0820 12/19/19 0136  NA 136 142 138  K 4.0 4.1 3.7  CL 101 104 103  CO2 28 27 25   GLUCOSE 169* 119* 136*  BUN 21* 13 11  CREATININE 0.92 0.87 0.90  CALCIUM 8.9 9.3 9.0   GFR: Estimated Creatinine Clearance: 130.5 mL/min (by C-G formula based on SCr of 0.9 mg/dL). Liver Function Tests: Recent Labs  Lab 12/17/19 1822  AST 17  ALT 11  ALKPHOS 102  BILITOT 0.9  PROT 7.5  ALBUMIN 3.5   No results for input(s): LIPASE, AMYLASE in the last 168 hours. No results for input(s): AMMONIA in the last 168 hours. Coagulation Profile: No results for input(s): INR, PROTIME in the last 168 hours. Cardiac Enzymes: No results for input(s): CKTOTAL, CKMB, CKMBINDEX, TROPONINI in the last 168 hours. BNP (last 3 results) No results for input(s): PROBNP in the last 8760 hours. HbA1C: No results for input(s): HGBA1C in the last 72 hours. CBG: Recent Labs  Lab 12/18/19 1557 12/18/19 1627 12/18/19 2030 12/19/19 0633 12/19/19 1118  GLUCAP 131* 122* 252* 121* 122*   Lipid Profile: No results for input(s): CHOL, HDL,  LDLCALC, TRIG, CHOLHDL, LDLDIRECT in the last 72 hours. Thyroid Function Tests: No results for input(s): TSH, T4TOTAL, FREET4, T3FREE, THYROIDAB in the last 72 hours. Anemia Panel: Recent Labs    12/19/19 0136  VITAMINB12 84*  FOLATE 14.7  FERRITIN 91  TIBC 322  IRON 49   Urine analysis:    Component Value Date/Time   COLORURINE YELLOW 08/14/2019 [place] 08/14/2019 1232   LABSPEC 1.020 08/14/2019 1232   PHURINE 5.0 08/14/2019 1232   GLUCOSEU NEGATIVE 08/14/2019 1232   HGBUR NEGATIVE 08/14/2019 1232   BILIRUBINUR NEGATIVE 08/14/2019 1232   KETONESUR NEGATIVE 08/14/2019 1232   PROTEINUR NEGATIVE 08/14/2019 1232   UROBILINOGEN 4.0 (H) 10/09/2008 1114   NITRITE NEGATIVE 08/14/2019 1232   LEUKOCYTESUR NEGATIVE 08/14/2019 1232   Sepsis Labs: @LABRCNTIP (procalcitonin:4,lacticidven:4)  ) Recent Results (from the past 240 hour(s))  Culture, blood (routine x 2)     Status: None (Preliminary result)   Collection Time: 12/17/19  6:22 PM   Specimen: BLOOD RIGHT FOREARM  Result Value Ref Range Status   Specimen Description BLOOD RIGHT FOREARM  Final   Special Requests   Final    BOTTLES DRAWN AEROBIC ONLY Blood Culture adequate volume  Culture   Final    NO GROWTH 2 DAYS Performed at [facility], [address] [place], [place] [address]    Report Status PENDING  Incomplete  Culture, blood (routine x 2)     Status: None (Preliminary result)   Collection Time: 12/17/19  6:22 PM   Specimen: Right Antecubital; Blood  Result Value Ref Range Status   Specimen Description RIGHT ANTECUBITAL  Final   Special Requests   Final    BOTTLES DRAWN AEROBIC AND ANAEROBIC Blood Culture adequate volume   Culture   Final    NO GROWTH 2 DAYS Performed at [facility], [address] [place], [place] [address]    Report Status PENDING  Incomplete  Respiratory Panel by RT PCR (Flu A&B, Covid) - Nasopharyngeal Swab     Status: None   Collection Time: 12/17/19  7:42 PM    Specimen: Nasopharyngeal Swab  Result Value Ref Range Status   SARS Coronavirus 2 by RT PCR NEGATIVE NEGATIVE Final    Comment: (NOTE) SARS-CoV-2 target nucleic acids are NOT DETECTED. The SARS-CoV-2 RNA is generally detectable in upper respiratoy specimens during the acute phase of infection. The lowest concentration of SARS-CoV-2 viral copies this assay can detect is 131 copies/mL. A negative result does not preclude SARS-Cov-2 infection and should not be used as the sole basis for treatment or other patient management decisions. A negative result may occur with  improper specimen collection/handling, submission of specimen other than nasopharyngeal swab, presence of viral mutation(s) within the areas targeted by this assay, and inadequate number of viral copies (<131 copies/mL). A negative result must be combined with clinical observations, patient history, and epidemiological information. The expected result is Negative. Fact Sheet for Patients:  PinkCheek.be Fact Sheet for Healthcare Providers:  GravelBags.it This test is not yet ap proved or cleared by the [place] FDA and  has been authorized for detection and/or diagnosis of SARS-CoV-2 by FDA under an Emergency Use Authorization (EUA). This EUA will remain  in effect (meaning this test can be used) for the duration of the COVID-19 declaration under Section 564(b)(1) of the Act, 21 U.S.C. section 360bbb-3(b)(1), unless the authorization is terminated or revoked sooner.    Influenza A by PCR NEGATIVE NEGATIVE Final   Influenza B by PCR NEGATIVE NEGATIVE Final    Comment: (NOTE) The Xpert Xpress SARS-CoV-2/FLU/RSV assay is intended as an aid in  the diagnosis of influenza from Nasopharyngeal swab specimens and  should not be used as a sole basis for treatment. Nasal washings and  aspirates are unacceptable for Xpert Xpress SARS-CoV-2/FLU/RSV  testing. Fact Sheet  for Patients: PinkCheek.be Fact Sheet for Healthcare Providers: GravelBags.it This test is not yet approved or cleared by the [place] FDA and  has been authorized for detection and/or diagnosis of SARS-CoV-2 by  FDA under an Emergency Use Authorization (EUA). This EUA will remain  in effect (meaning this test can be used) for the duration of the  Covid-19 declaration under Section 564(b)(1) of the Act, 21  U.S.C. section 360bbb-3(b)(1), unless the authorization is  terminated or revoked. Performed at [facility], [address] [place], [place] [address]   Aerobic/Anaerobic Culture (surgical/deep wound)     Status: None (Preliminary result)   Collection Time: 12/18/19  2:56 PM   Specimen: PATH Other; Tissue  Result Value Ref Range Status   Specimen Description WOUND LEFT FOOT  Final   Special Requests PT ON ZOSYN VANC  Final   Gram Stain  Final    RARE WBC PRESENT, PREDOMINANTLY PMN ABUNDANT GRAM POSITIVE COCCI ABUNDANT GRAM NEGATIVE RODS    Culture   Final    CULTURE REINCUBATED FOR BETTER GROWTH Performed at [facility], [place] [address] [place], [place] [address]    Report Status PENDING  Incomplete  Aerobic/Anaerobic Culture (surgical/deep wound)     Status: None (Preliminary result)   Collection Time: 12/18/19  3:08 PM   Specimen: Other Source; Tissue  Result Value Ref Range Status   Specimen Description BONE LEFT FOOT  Final   Special Requests PT ON ZOSYN VANC  Final   Gram Stain   Final    RARE WBC PRESENT, PREDOMINANTLY MONONUCLEAR FEW GRAM POSITIVE COCCI    Culture   Final    CULTURE REINCUBATED FOR BETTER GROWTH Performed at [facility], [place] [address] [place], [place] [address]    Report Status PENDING  Incomplete      Studies: No results found.  Scheduled Meds: . amitriptyline  100 mg Oral QHS  . atorvastatin  40 mg Oral QPM  . DULoxetine  60 mg Oral Daily  . ferrous  sulfate  325 mg Oral Daily  . furosemide  20 mg Oral Daily  . gabapentin  900 mg Oral BID WC   And  . gabapentin  1,200 mg Oral QHS  . glycopyrrolate  1 mg Oral BID  . insulin aspart  0-15 Units Subcutaneous TID WC  . insulin aspart  0-5 Units Subcutaneous QHS  . lisinopril  20 mg Oral Daily  . pantoprazole  40 mg Oral Daily  . primidone  50 mg Oral BID  . vitamin B-12  1,000 mcg Oral Daily    Continuous Infusions: . piperacillin-tazobactam (ZOSYN)  IV 3.375 g (12/19/19 1223)  . vancomycin 1,500 mg (12/19/19 0950)     LOS: 2 days     Alma Friendly, MD [facility]  If 7PM-7AM, please contact night-coverage [email or web address] 12/19/2019, 3:35 PM

## 2019-12-19 NOTE — Anesthesia Postprocedure Evaluation (Signed)
Anesthesia Post Note  Patient: Eugene Diaz  Procedure(s) Performed: LEFT FOOT DEBRIDEMENT WITH PARTIAL INCISION OF INFECTED BONE (Left )     Patient location during evaluation: PACU Anesthesia Type: MAC Level of consciousness: awake and alert Pain management: pain level controlled Vital Signs Assessment: post-procedure vital signs reviewed and stable Respiratory status: spontaneous breathing, nonlabored ventilation, respiratory function stable and patient connected to nasal cannula oxygen Cardiovascular status: stable and blood pressure returned to baseline Postop Assessment: no apparent nausea or vomiting Anesthetic complications: no    Last Vitals:  Vitals:   12/19/19 0337 12/19/19 0752  BP: (!) 152/77 (!) 153/86  Pulse: 77 73  Resp: 18 17  Temp: 36.7 C 36.6 C  SpO2: 96% 97%    Last Pain:  Vitals:   12/19/19 0752  TempSrc: Oral  PainSc:                  [name]

## 2019-12-19 NOTE — Progress Notes (Signed)
Physical Therapy Treatment Patient Details Name: Eugene Diaz MRN: [id number] DOB: 13-Jan-1966 Today's Date: 12/19/2019    History of Present Illness Pt is a 54 y/o male admitted secondary to L foot wound infection s/p L transmetatarsal amputation on 11/13/19. He is now s/p I&D on 12/18/19. Planning for Charcot amputation on 12/23/19. PMH including but not limited to DM and HTN.    PT Comments    Pt making steady progress with functional mobility. He was able to maintain NWB L LE throughout independently. He was able to hop on R LE with use of RW to ambulate within his room with min guard for safety. No LOB or physical assistance needed throughout. Pt would continue to benefit from skilled physical therapy services at this time while admitted and after d/c to address the below listed limitations in order to improve overall safety and independence with functional mobility.   Follow Up Recommendations  Home health PT     Equipment Recommendations  None recommended by PT    Recommendations for Other Services       Precautions / Restrictions Precautions Precautions: Fall Restrictions Weight Bearing Restrictions: Yes LLE Weight Bearing: Non weight bearing    Mobility  Bed Mobility Overal bed mobility: Needs Assistance Bed Mobility: Supine to Sit     Supine to sit: Supervision     General bed mobility comments: no physical assistance needed  Transfers Overall transfer level: Needs assistance Equipment used: Rolling walker (2 wheeled) Transfers: Sit to/from Stand Sit to Stand: Min guard         General transfer comment: min guard for safety with transitional movement, good technique with use of RW  Ambulation/Gait Ambulation/Gait assistance: Min guard Gait Distance (Feet): 10 Feet Assistive device: Rolling walker (2 wheeled) Gait Pattern/deviations: (hop-to on R LE) Gait velocity: decreased   General Gait Details: pt with good upper body strength to be able to hop  on R LE with use of RW; no LOB or instability, min guard for safety   Stairs             Wheelchair Mobility    Modified Rankin (Stroke Patients Only)       Balance Overall balance assessment: Needs assistance Sitting-balance support: Feet supported Sitting balance-Leahy Scale: Good     Standing balance support: Bilateral upper extremity supported Standing balance-Leahy Scale: Poor                              Cognition Arousal/Alertness: Awake/alert Behavior During Therapy: WFL for tasks assessed/performed Overall Cognitive Status: Within Functional Limits for tasks assessed                                        Exercises      General Comments        Pertinent Vitals/Pain Pain Assessment: No/denies pain    Home Living                      Prior Function            PT Goals (current goals can now be found in the care plan section) Acute Rehab PT Goals PT Goal Formulation: With patient Time For Goal Achievement: 01/01/20 Potential to Achieve Goals: Good Progress towards PT goals: Progressing toward goals    Frequency    Min 3X/week  PT Plan Current plan remains appropriate    Co-evaluation              AM-PAC PT "6 Clicks" Mobility   Outcome Measure  Help needed turning from your back to your side while in a flat bed without using bedrails?: None Help needed moving from lying on your back to sitting on the side of a flat bed without using bedrails?: None Help needed moving to and from a bed to a chair (including a wheelchair)?: None Help needed standing up from a chair using your arms (e.g., wheelchair or bedside chair)?: None Help needed to walk in hospital room?: A Little Help needed climbing 3-5 steps with a railing? : A Lot 6 Click Score: 21    End of Session Equipment Utilized During Treatment: Gait belt Activity Tolerance: Patient tolerated treatment well Patient left: in  chair;with call bell/phone within reach;with chair alarm set Nurse Communication: Mobility status PT Visit Diagnosis: Other abnormalities of gait and mobility (R26.89);Muscle weakness (generalized) (M62.81);Pain Pain - Right/Left: Left Pain - part of body: Leg;Ankle and joints of foot     Time: [id number] PT Time Calculation (min) (ACUTE ONLY): 33 min  Charges:  $Gait Training: 8-22 mins $Therapeutic Activity: 8-22 mins                     Anastasio Champion, DPT  Acute Rehabilitation Services Pager [phone] Office [place] 12/19/2019, 1:06 PM

## 2019-12-20 ENCOUNTER — Telehealth: Payer: Self-pay | Admitting: Podiatry

## 2019-12-20 LAB — CBC WITH DIFFERENTIAL/PLATELET
Abs Immature Granulocytes: 0.03 10*3/uL (ref 0.00–0.07)
Basophils Absolute: 0.1 10*3/uL (ref 0.0–0.1)
Basophils Relative: 1 %
Eosinophils Absolute: 0.4 10*3/uL (ref 0.0–0.5)
Eosinophils Relative: 8 %
HCT: 29.2 % — ABNORMAL LOW (ref 39.0–52.0)
Hemoglobin: 9.4 g/dL — ABNORMAL LOW (ref 13.0–17.0)
Immature Granulocytes: 1 %
Lymphocytes Relative: 29 %
Lymphs Abs: 1.5 10*3/uL (ref 0.7–4.0)
MCH: 29.5 pg (ref 26.0–34.0)
MCHC: 32.2 g/dL (ref 30.0–36.0)
MCV: 91.5 fL (ref 80.0–100.0)
Monocytes Absolute: 0.3 10*3/uL (ref 0.1–1.0)
Monocytes Relative: 6 %
Neutro Abs: 2.9 10*3/uL (ref 1.7–7.7)
Neutrophils Relative %: 55 %
Platelets: 186 10*3/uL (ref 150–400)
RBC: 3.19 MIL/uL — ABNORMAL LOW (ref 4.22–5.81)
RDW: 16.9 % — ABNORMAL HIGH (ref 11.5–15.5)
WBC: 5.3 10*3/uL (ref 4.0–10.5)
nRBC: 0 % (ref 0.0–0.2)

## 2019-12-20 LAB — BASIC METABOLIC PANEL
Anion gap: 9 (ref 5–15)
BUN: 9 mg/dL (ref 6–20)
CO2: 26 mmol/L (ref 22–32)
Calcium: 9 mg/dL (ref 8.9–10.3)
Chloride: 103 mmol/L (ref 98–111)
Creatinine, Ser: 1.04 mg/dL (ref 0.61–1.24)
GFR calc Af Amer: >60 mL/min (ref >60)
GFR calc non Af Amer: >60 mL/min (ref >60)
Glucose, Bld: 123 mg/dL — ABNORMAL HIGH (ref 70–99)
Potassium: 3.9 mmol/L (ref 3.5–5.1)
Sodium: 138 mmol/L (ref 135–145)

## 2019-12-20 LAB — GLUCOSE, CAPILLARY
Glucose-Capillary: 133 mg/dL — ABNORMAL HIGH (ref 70–99)
Glucose-Capillary: 163 mg/dL — ABNORMAL HIGH (ref 70–99)
Glucose-Capillary: 210 mg/dL — ABNORMAL HIGH (ref 70–99)
Glucose-Capillary: 181 mg/dL — ABNORMAL HIGH (ref 70–99)

## 2019-12-20 NOTE — Evaluation (Signed)
Occupational Therapy Evaluation Patient Details Name: Eugene Diaz MRN: [id number] DOB: [birth year] Today's Date: 12/20/2019    History of Present Illness Pt is a 54 y/o male admitted secondary to L foot wound infection s/p L transmetatarsal amputation on 11/13/19. He is now s/p I&D on 12/18/19. Planning for Charcot amputation on 12/21/19 per podiatry note 12/19/19. PMH including but not limited to DM and HTN.   Clinical Impression   This 54 yo male admitted with above presents to acute OT at a Mod I level pta for basic ADLs and now is setup for UB ADLs but needs increased A for LB ADLs-min-mod A due to increased pain in LLE, decreased balance, and decreased mobility. He will benefit from acute OT with follow up at on CIR to get back to a Mod I level.    Follow Up Recommendations  CIR    Equipment Recommendations  3 in 1 bedside commode    Recommendations for Other Services Rehab consult     Precautions / Restrictions Precautions Precautions: Fall Restrictions Weight Bearing Restrictions: Yes LLE Weight Bearing: Non weight bearing      Mobility Bed Mobility Overal bed mobility: Modified Independent                Transfers Overall transfer level: Needs assistance Equipment used: Rolling walker (2 wheeled) Transfers: Sit to/from Stand Sit to Stand: Min guard         General transfer comment: min guard for safety with transitional movement    Balance Overall balance assessment: Needs assistance Sitting-balance support: Feet supported;No upper extremity supported Sitting balance-Leahy Scale: Good     Standing balance support: Single extremity supported Standing balance-Leahy Scale: Poor                             ADL either performed or assessed with clinical judgement   ADL Overall ADL's : Needs assistance/impaired Eating/Feeding: Independent;Sitting   Grooming: Set up;Sitting   Upper Body Bathing: Set up;Sitting   Lower Body Bathing:  Minimal assistance Lower Body Bathing Details (indicate cue type and reason): for balance in standing Upper Body Dressing : Set up;Sitting   Lower Body Dressing: Moderate assistance Lower Body Dressing Details (indicate cue type and reason): with min A for balance in standing Toilet Transfer: Minimal assistance;Ambulation;RW Toilet Transfer Details (indicate cue type and reason): bed>out door and back around to recliner on other side of bed (~20 feet) with one standing rest break) Toileting- Clothing Manipulation and Hygiene: Minimal assistance Toileting - Clothing Manipulation Details (indicate cue type and reason): balance in standing             Vision Patient Visual Report: No change from baseline              Pertinent Vitals/Pain Pain Assessment: 0-10 Pain Score: 7  Pain Location: L foot--when in dependent position Pain Descriptors / Indicators: Aching Pain Intervention(s): Limited activity within patient's tolerance;Monitored during session;Repositioned     Hand Dominance Right   Extremity/Trunk Assessment Upper Extremity Assessment Upper Extremity Assessment: Overall WFL for tasks assessed           Communication Communication Communication: No difficulties   Cognition Arousal/Alertness: Awake/alert Behavior During Therapy: WFL for tasks assessed/performed Overall Cognitive Status: Within Functional Limits for tasks assessed  Home Living Family/patient expects to be discharged to:: Private residence Living Arrangements: Alone Available Help at Discharge: Friend(s);Family;Available PRN/intermittently Type of Home: Mobile home Home Access: Stairs to enter Entrance Stairs-Number of Steps: 1 Entrance Stairs-Rails: None Home Layout: One level     Bathroom Shower/Tub: Corporate investment banker: Standard     Home Equipment: Cane - single point;Crutches;Walker - 2 wheels           Prior Functioning/Environment Level of Independence: Independent with assistive device(s)        Comments: ambulates with either crutches or a cane; washes up at that since        OT Problem List: Decreased range of motion;Impaired balance (sitting and/or standing);Pain      OT Treatment/Interventions: Self-care/ADL training;DME and/or AE instruction;Patient/family education;Balance training    OT Goals(Current goals can be found in the care plan section) Acute Rehab OT Goals Patient Stated Goal: not sure about rehab--family thinks I should go OT Goal Formulation: With patient Time For Goal Achievement: 01/03/20 Potential to Achieve Goals: Good  OT Frequency: Min 2X/week              AM-PAC OT "6 Clicks" Daily Activity     Outcome Measure Help from another person eating meals?: None Help from another person taking care of personal grooming?: A Little Help from another person toileting, which includes using toliet, bedpan, or urinal?: A Little Help from another person bathing (including washing, rinsing, drying)?: A Little Help from another person to put on and taking off regular upper body clothing?: A Little Help from another person to put on and taking off regular lower body clothing?: A Little 6 Click Score: 19   End of Session Equipment Utilized During Treatment: Gait belt;Rolling walker  Activity Tolerance: Patient tolerated treatment well Patient left: in chair;with call bell/phone within reach;with chair alarm set  OT Visit Diagnosis: Unsteadiness on feet (R26.81);Other abnormalities of gait and mobility (R26.89);Pain Pain - Right/Left: Left Pain - part of body: Ankle and joints of foot                Time: [id number] OT Time Calculation (min): 37 min Charges:  OT General Charges $OT Visit: 1 Visit OT Evaluation $OT Eval Moderate Complexity: 1 Mod OT Treatments $Self Care/Home Management : 8-22 mins  Tye [place] , OTR/L Acute Rehab Services Pager  [phone] Office [phone]     12/20/2019, 3:55 PM

## 2019-12-20 NOTE — Progress Notes (Signed)
PROGRESS NOTE  Eugene Diaz [id number] DOB: Mar 01, 1966 DOA: 12/17/2019 PCP: [name], PA-C  HPI/Recap of past 24 hours: HPI from Dr Eugene Diaz is a 54 y.o. male with medical history significant for DM, GERD, peripheral neuropathy, left wound requiring midfoot Lisfranc amputation with [name] on November 13, 2019 presents to the emergency department with concern of wound infection.  Patient has a wound nurse who sees patient on Monday/Wednesday/Friday every other week for wound dressing changes. Pt noted to have foul-smelling draining discharge with redness extending up the leg, asked to go to the ED for further evaluation. In the ED, BP was elevated at 174/78, but other vital signs were stable. Labs showed normocytic anemia and hyperglycemia.  Left foot x-ray showed midfoot amputation with progressive soft tissue swelling and cortical erosion of the cuneiforms and possibly cuboid bones compatible with osteomyelitis and soft tissue infection. Patient's podiatrist ([name]) was consulted by EDP and recommended starting patient with IV vancomycin and Zosyn with plan to admit patient to hospitalist service at [facility].     Today, patient denies any new complaints, was pretty sad about the fact that he may loose his lower extremity.  Denies any chest pain, shortness of breath, abdominal pain, nausea/vomiting, fever/chills.   Assessment/Plan: Principal Problem:   Osteomyelitis of ankle or foot, acute, left (HCC) Active Problems:   Diabetes mellitus without complication (HCC)   Hypertension   Hypercholesterolemia   Diabetic neuropathy (HCC)   GERD (gastroesophageal reflux disease)   Diabetic infection of left foot (HCC)   Anemia, chronic disease   Hyperglycemia   Left foot wound dehiscence and infection/Osteomyelitis s/p debridement on 12/18/19 Currently afebrile with no leukocytosis BC x2 NGTD X-ray of left foot showed progressive soft tissue swelling and  cortical erosion compatible with osteomyelitis and soft tissue infection Bone culture pending, GPC on Gram stain Podiatry, [name], on board, plan for conversion to chopart amputation on 12/21/19 as last attempt to salvage the limb, if fails would benefit from BKA Continue empiric Vanco and Zosyn Wound care PT/OT Daily CBC  Chronic normocytic anemia/iron deficiency Likely 2/2 chronic disease Hemoglobin around baseline Anemia panel showed iron 49, sats 15, ferritin 91, folate 14.7, vitamin B12 84 Continue oral iron supplementation, start vitamin B12 supplementation Daily CBC  Diabetes mellitus type 2/neuropathy Last A1c 6.4 on 11/10/2019 SSI, Accu-Cheks, hypoglycemic protocol Continue gabapentin Hold home glimepiride  Hypertension Continue lisinopril, Lasix per home regimen  Hyperlipidemia Continue statins  GERD Continue Protonix  Anxiety/depression/grieving Denies any SI/HI Continue Xanax, Cymbalta, hydroxyzine Refusing psychiatry/mental health consultation  Obesity Lifestyle modification advised       Malnutrition Type:      Malnutrition Characteristics:      Nutrition Interventions:       Estimated body mass index is 34.66 kg/m as calculated from the following:   Height as of this encounter: 6' 2.02" (1.88 m).   Weight as of this encounter: 122.5 kg.     Code Status: Full  Family Communication: None at bedside  Disposition Plan: Likely home once work-up complete/consultant signing off   Consultants:  Podiatry, [name]  Procedures:  Wound debridement  Antimicrobials:  Vancomycin  Zosyn  DVT prophylaxis: SCD-due to pending surgery   Objective: Vitals:   12/19/19 1939 12/20/19 0333 12/20/19 0734 12/20/19 1420  BP: (!) 160/84 (!) 156/82 (!) 145/89 (!) 142/82  Pulse: 77 64 69 64  Resp: 18 18 18 18   Temp: 98.7 F (37.1 C) 98.1 F (  36.7 C) 98.3 F (36.8 C) 98.1 F (36.7 C)  TempSrc: Oral Oral Oral Oral  SpO2: 99% 98% 97%  98%  Weight:      Height:        Intake/Output Summary (Last 24 hours) at 12/20/2019 1621 Last data filed at 12/20/2019 1300 Gross per 24 hour  Intake 1580.66 ml  Output 2700 ml  Net -1119.34 ml   Filed Weights   12/17/19 2210 12/18/19 0000 12/18/19 1428  Weight: 122.5 kg 122.5 kg 122.5 kg    Exam:  General: NAD  Cardiovascular: S1, S2 present  Respiratory: CTAB  Abdomen: Soft, nontender, nondistended, bowel sounds present  Musculoskeletal: No bilateral pedal edema noted, left foot dressing C/D/I  Skin:  Normal except for left foot as mentioned above  Psychiatry: Fair mood    Data Reviewed: CBC: Recent Labs  Lab 12/17/19 1822 12/18/19 0820 12/19/19 0136 12/20/19 0452  WBC 5.3 4.7 7.2 5.3  NEUTROABS 3.1 3.1 5.2 2.9  HGB 9.9* 9.8* 9.5* 9.4*  HCT 31.4* 30.1* 29.9* 29.2*  MCV 94.0 91.2 91.4 91.5  PLT 185 177 184 [id number]   Basic Metabolic Panel: Recent Labs  Lab 12/17/19 1822 12/18/19 0820 12/19/19 0136 12/20/19 0452  NA 136 142 138 138  K 4.0 4.1 3.7 3.9  CL 101 104 103 103  CO2 28 27 25 26   GLUCOSE 169* 119* 136* 123*  BUN 21* 13 11 9   CREATININE 0.92 0.87 0.90 1.04  CALCIUM 8.9 9.3 9.0 9.0   GFR: Estimated Creatinine Clearance: 112.9 mL/min (by C-G formula based on SCr of 1.04 mg/dL). Liver Function Tests: Recent Labs  Lab 12/17/19 1822  AST 17  ALT 11  ALKPHOS 102  BILITOT 0.9  PROT 7.5  ALBUMIN 3.5   No results for input(s): LIPASE, AMYLASE in the last 168 hours. No results for input(s): AMMONIA in the last 168 hours. Coagulation Profile: No results for input(s): INR, PROTIME in the last 168 hours. Cardiac Enzymes: No results for input(s): CKTOTAL, CKMB, CKMBINDEX, TROPONINI in the last 168 hours. BNP (last 3 results) No results for input(s): PROBNP in the last 8760 hours. HbA1C: No results for input(s): HGBA1C in the last 72 hours. CBG: Recent Labs  Lab 12/19/19 1118 12/19/19 1551 12/19/19 2018 12/20/19 0647 12/20/19 1125    GLUCAP 122* 180* 160* 133* 163*   Lipid Profile: No results for input(s): CHOL, HDL, LDLCALC, TRIG, CHOLHDL, LDLDIRECT in the last 72 hours. Thyroid Function Tests: No results for input(s): TSH, T4TOTAL, FREET4, T3FREE, THYROIDAB in the last 72 hours. Anemia Panel: Recent Labs    12/19/19 0136  VITAMINB12 84*  FOLATE 14.7  FERRITIN 91  TIBC 322  IRON 49   Urine analysis:    Component Value Date/Time   COLORURINE YELLOW 08/14/2019 [place] 08/14/2019 1232   LABSPEC 1.020 08/14/2019 1232   PHURINE 5.0 08/14/2019 1232   GLUCOSEU NEGATIVE 08/14/2019 1232   HGBUR NEGATIVE 08/14/2019 1232   BILIRUBINUR NEGATIVE 08/14/2019 1232   KETONESUR NEGATIVE 08/14/2019 1232   PROTEINUR NEGATIVE 08/14/2019 1232   UROBILINOGEN 4.0 (H) 10/09/2008 1114   NITRITE NEGATIVE 08/14/2019 1232   LEUKOCYTESUR NEGATIVE 08/14/2019 1232   Sepsis Labs: @LABRCNTIP (procalcitonin:4,lacticidven:4)  ) Recent Results (from the past 240 hour(s))  Culture, blood (routine x 2)     Status: None (Preliminary result)   Collection Time: 12/17/19  6:22 PM   Specimen: BLOOD RIGHT FOREARM  Result Value Ref Range Status   Specimen Description BLOOD RIGHT FOREARM  Final  Special Requests   Final    BOTTLES DRAWN AEROBIC ONLY Blood Culture adequate volume   Culture   Final    NO GROWTH 3 DAYS Performed at [facility], [address] [place], [place] [address]    Report Status PENDING  Incomplete  Culture, blood (routine x 2)     Status: None (Preliminary result)   Collection Time: 12/17/19  6:22 PM   Specimen: Right Antecubital; Blood  Result Value Ref Range Status   Specimen Description RIGHT ANTECUBITAL  Final   Special Requests   Final    BOTTLES DRAWN AEROBIC AND ANAEROBIC Blood Culture adequate volume   Culture   Final    NO GROWTH 3 DAYS Performed at [facility], [address], [place], [place] [address]    Report Status PENDING  Incomplete  Respiratory Panel by RT PCR (Flu A&B,  Covid) - Nasopharyngeal Swab     Status: None   Collection Time: 12/17/19  7:42 PM   Specimen: Nasopharyngeal Swab  Result Value Ref Range Status   SARS Coronavirus 2 by RT PCR NEGATIVE NEGATIVE Final    Comment: (NOTE) SARS-CoV-2 target nucleic acids are NOT DETECTED. The SARS-CoV-2 RNA is generally detectable in upper respiratoy specimens during the acute phase of infection. The lowest concentration of SARS-CoV-2 viral copies this assay can detect is 131 copies/mL. A negative result does not preclude SARS-Cov-2 infection and should not be used as the sole basis for treatment or other patient management decisions. A negative result may occur with  improper specimen collection/handling, submission of specimen other than nasopharyngeal swab, presence of viral mutation(s) within the areas targeted by this assay, and inadequate number of viral copies (<131 copies/mL). A negative result must be combined with clinical observations, patient history, and epidemiological information. The expected result is Negative. Fact Sheet for Patients:  PinkCheek.be Fact Sheet for Healthcare Providers:  GravelBags.it This test is not yet ap proved or cleared by the [place] FDA and  has been authorized for detection and/or diagnosis of SARS-CoV-2 by FDA under an Emergency Use Authorization (EUA). This EUA will remain  in effect (meaning this test can be used) for the duration of the COVID-19 declaration under Section 564(b)(1) of the Act, 21 U.S.C. section 360bbb-3(b)(1), unless the authorization is terminated or revoked sooner.    Influenza A by PCR NEGATIVE NEGATIVE Final   Influenza B by PCR NEGATIVE NEGATIVE Final    Comment: (NOTE) The Xpert Xpress SARS-CoV-2/FLU/RSV assay is intended as an aid in  the diagnosis of influenza from Nasopharyngeal swab specimens and  should not be used as a sole basis for treatment. Nasal washings and    aspirates are unacceptable for Xpert Xpress SARS-CoV-2/FLU/RSV  testing. Fact Sheet for Patients: PinkCheek.be Fact Sheet for Healthcare Providers: GravelBags.it This test is not yet approved or cleared by the [place] FDA and  has been authorized for detection and/or diagnosis of SARS-CoV-2 by  FDA under an Emergency Use Authorization (EUA). This EUA will remain  in effect (meaning this test can be used) for the duration of the  Covid-19 declaration under Section 564(b)(1) of the Act, 21  U.S.C. section 360bbb-3(b)(1), unless the authorization is  terminated or revoked. Performed at [facility], [address], [place], [place] [address]   Aerobic/Anaerobic Culture (surgical/deep wound)     Status: None (Preliminary result)   Collection Time: 12/18/19  2:56 PM   Specimen: PATH Other; Tissue  Result Value Ref Range Status   Specimen Description WOUND  LEFT FOOT  Final   Special Requests PT ON ZOSYN VANC  Final   Gram Stain   Final    RARE WBC PRESENT, PREDOMINANTLY PMN ABUNDANT GRAM POSITIVE COCCI ABUNDANT GRAM NEGATIVE RODS    Culture   Final    FEW GRAM NEGATIVE RODS RARE STAPHYLOCOCCUS AUREUS CULTURE REINCUBATED FOR BETTER GROWTH HOLDING FOR POSSIBLE ANAEROBE Performed at [facility], [place] [address] [place], [place] [address]    Report Status PENDING  Incomplete  Aerobic/Anaerobic Culture (surgical/deep wound)     Status: None (Preliminary result)   Collection Time: 12/18/19  3:08 PM   Specimen: Other Source; Tissue  Result Value Ref Range Status   Specimen Description BONE LEFT FOOT  Final   Special Requests PT ON ZOSYN VANC  Final   Gram Stain   Final    RARE WBC PRESENT, PREDOMINANTLY MONONUCLEAR FEW GRAM POSITIVE COCCI    Culture   Final    RARE STAPHYLOCOCCUS AUREUS FEW GRAM NEGATIVE RODS CULTURE REINCUBATED FOR BETTER GROWTH HOLDING FOR POSSIBLE ANAEROBE Performed at [place], [place] [address] [place], [place] [address]    Report Status PENDING  Incomplete      Studies: No results found.  Scheduled Meds: . amitriptyline  100 mg Oral QHS  . atorvastatin  40 mg Oral QPM  . DULoxetine  60 mg Oral Daily  . ferrous sulfate  325 mg Oral Daily  . furosemide  20 mg Oral Daily  . gabapentin  900 mg Oral BID WC   And  . gabapentin  1,200 mg Oral QHS  . glycopyrrolate  1 mg Oral BID  . insulin aspart  0-15 Units Subcutaneous TID WC  . insulin aspart  0-5 Units Subcutaneous QHS  . lisinopril  20 mg Oral Daily  . pantoprazole  40 mg Oral Daily  . primidone  50 mg Oral BID  . vitamin B-12  1,000 mcg Oral Daily    Continuous Infusions: . piperacillin-tazobactam (ZOSYN)  IV 3.375 g (12/20/19 1144)  . vancomycin 1,500 mg (12/20/19 0907)     LOS: 3 days     Alma Friendly, MD [facility]  If 7PM-7AM, please contact night-coverage [email or web address] 12/20/2019, 4:21 PM

## 2019-12-20 NOTE — Telephone Encounter (Signed)
Spoke with patient's sister for update on pending surgery.

## 2019-12-21 ENCOUNTER — Encounter (HOSPITAL_COMMUNITY)
Admission: EM | Disposition: A | Discharge status: Skilled Nursing Facility | Payer: Self-pay | Source: Home / Self Care | Attending: Internal Medicine

## 2019-12-21 ENCOUNTER — Inpatient Hospital Stay (HOSPITAL_COMMUNITY): Payer: Medicaid Other

## 2019-12-21 ENCOUNTER — Inpatient Hospital Stay (HOSPITAL_COMMUNITY): Payer: Medicaid Other | Admitting: Anesthesiology

## 2019-12-21 DIAGNOSIS — M86672 Other chronic osteomyelitis, left ankle and foot: Secondary | ICD-10-CM

## 2019-12-21 HISTORY — PX: AMPUTATION: SHX166

## 2019-12-21 HISTORY — PX: APPLICATION OF WOUND VAC: SHX5189

## 2019-12-21 LAB — CBC WITH DIFFERENTIAL/PLATELET
Abs Immature Granulocytes: 0.03 10*3/uL (ref 0.00–0.07)
Basophils Absolute: 0.1 10*3/uL (ref 0.0–0.1)
Basophils Relative: 1 %
Eosinophils Absolute: 0.5 10*3/uL (ref 0.0–0.5)
Eosinophils Relative: 9 %
HCT: 29.7 % — ABNORMAL LOW (ref 39.0–52.0)
Hemoglobin: 9.7 g/dL — ABNORMAL LOW (ref 13.0–17.0)
Immature Granulocytes: 1 %
Lymphocytes Relative: 30 %
Lymphs Abs: 1.6 10*3/uL (ref 0.7–4.0)
MCH: 29.4 pg (ref 26.0–34.0)
MCHC: 32.7 g/dL (ref 30.0–36.0)
MCV: 90 fL (ref 80.0–100.0)
Monocytes Absolute: 0.3 10*3/uL (ref 0.1–1.0)
Monocytes Relative: 6 %
Neutro Abs: 2.8 10*3/uL (ref 1.7–7.7)
Neutrophils Relative %: 53 %
Platelets: 200 10*3/uL (ref 150–400)
RBC: 3.3 MIL/uL — ABNORMAL LOW (ref 4.22–5.81)
RDW: 16.8 % — ABNORMAL HIGH (ref 11.5–15.5)
WBC: 5.3 10*3/uL (ref 4.0–10.5)
nRBC: 0 % (ref 0.0–0.2)

## 2019-12-21 LAB — BASIC METABOLIC PANEL
Anion gap: 10 (ref 5–15)
BUN: 13 mg/dL (ref 6–20)
CO2: 27 mmol/L (ref 22–32)
Calcium: 9.3 mg/dL (ref 8.9–10.3)
Chloride: 102 mmol/L (ref 98–111)
Creatinine, Ser: 1.01 mg/dL (ref 0.61–1.24)
GFR calc Af Amer: >60 mL/min (ref >60)
GFR calc non Af Amer: >60 mL/min (ref >60)
Glucose, Bld: 142 mg/dL — ABNORMAL HIGH (ref 70–99)
Potassium: 4.6 mmol/L (ref 3.5–5.1)
Sodium: 139 mmol/L (ref 135–145)

## 2019-12-21 LAB — GLUCOSE, CAPILLARY
Glucose-Capillary: 145 mg/dL — ABNORMAL HIGH (ref 70–99)
Glucose-Capillary: 147 mg/dL — ABNORMAL HIGH (ref 70–99)
Glucose-Capillary: 148 mg/dL — ABNORMAL HIGH (ref 70–99)
Glucose-Capillary: 141 mg/dL — ABNORMAL HIGH (ref 70–99)
Glucose-Capillary: 150 mg/dL — ABNORMAL HIGH (ref 70–99)

## 2019-12-21 LAB — VANCOMYCIN, TROUGH: Vancomycin Tr: 17 ug/mL (ref 15–20)

## 2019-12-21 LAB — VANCOMYCIN, PEAK: Vancomycin Pk: 30 ug/mL (ref 30–40)

## 2019-12-21 SURGERY — AMPUTATION, FOOT, RAY
Anesthesia: Monitor Anesthesia Care | Site: Foot | Laterality: Left

## 2019-12-21 MED ORDER — VANCOMYCIN HCL 1250 MG/250ML IV SOLN
1250.0000 mg | Freq: Two times a day (BID) | INTRAVENOUS | Status: DC
  Administered 2019-12-22 – 2019-12-23 (×3): 1250 mg via INTRAVENOUS
  Filled 2019-12-21 (×3): qty 250

## 2019-12-21 MED ORDER — VANCOMYCIN HCL 1 G IV SOLR
INTRAVENOUS | Status: DC | PRN
  Administered 2019-12-21: 1000 mg

## 2019-12-21 MED ORDER — MEPERIDINE HCL 25 MG/ML IJ SOLN: 6.2500 mg | INTRAMUSCULAR | Status: DC | PRN

## 2019-12-21 MED ORDER — MIDAZOLAM HCL 2 MG/2ML IJ SOLN
INTRAMUSCULAR | Status: DC | PRN
  Administered 2019-12-21: 2 mg via INTRAVENOUS

## 2019-12-21 MED ORDER — MIDAZOLAM HCL 2 MG/2ML IJ SOLN
INTRAMUSCULAR | Status: AC
  Filled 2019-12-21: qty 2

## 2019-12-21 MED ORDER — FENTANYL CITRATE (PF) 250 MCG/5ML IJ SOLN
INTRAMUSCULAR | Status: DC | PRN
  Administered 2019-12-21: 50 ug via INTRAVENOUS

## 2019-12-21 MED ORDER — PROPOFOL 10 MG/ML IV BOLUS: INTRAVENOUS | Status: DC | PRN

## 2019-12-21 MED ORDER — BUPIVACAINE HCL 0.5 % IJ SOLN
INTRAMUSCULAR | Status: DC | PRN
  Administered 2019-12-21: 10 mL

## 2019-12-21 MED ORDER — PROPOFOL 500 MG/50ML IV EMUL
INTRAVENOUS | Status: DC | PRN
  Administered 2019-12-21: 100 ug/kg/min via INTRAVENOUS

## 2019-12-21 MED ORDER — ONDANSETRON HCL 4 MG/2ML IJ SOLN
INTRAMUSCULAR | Status: AC
  Filled 2019-12-21: qty 2

## 2019-12-21 MED ORDER — LACTATED RINGERS IV SOLN: INTRAVENOUS | Status: DC | PRN

## 2019-12-21 MED ORDER — VANCOMYCIN HCL 1000 MG IV SOLR
INTRAVENOUS | Status: AC
  Filled 2019-12-21: qty 1000

## 2019-12-21 MED ORDER — MORPHINE SULFATE (PF) 2 MG/ML IV SOLN
2.0000 mg | INTRAVENOUS | Status: DC | PRN
  Administered 2019-12-21 – 2019-12-23 (×7): 2 mg via INTRAVENOUS
  Filled 2019-12-21 (×7): qty 1

## 2019-12-21 MED ORDER — PROPOFOL 10 MG/ML IV BOLUS
INTRAVENOUS | Status: AC
  Filled 2019-12-21: qty 20

## 2019-12-21 MED ORDER — ONDANSETRON HCL 4 MG/2ML IJ SOLN
INTRAMUSCULAR | Status: DC | PRN
  Administered 2019-12-21: 4 mg via INTRAVENOUS

## 2019-12-21 MED ORDER — FENTANYL CITRATE (PF) 250 MCG/5ML IJ SOLN
INTRAMUSCULAR | Status: AC
  Filled 2019-12-21: qty 5

## 2019-12-21 MED ORDER — SODIUM CHLORIDE 0.9 % IR SOLN
Status: DC | PRN
  Administered 2019-12-21 (×2): 3000 mL

## 2019-12-21 MED ORDER — PROPOFOL 10 MG/ML IV BOLUS
INTRAVENOUS | Status: DC | PRN
  Administered 2019-12-21: 20 ug via INTRAVENOUS

## 2019-12-21 MED ORDER — ONDANSETRON HCL 4 MG/2ML IJ SOLN: 4.0000 mg | Freq: Once | INTRAMUSCULAR | Status: DC | PRN

## 2019-12-21 MED ORDER — HYDROMORPHONE HCL 1 MG/ML IJ SOLN
INTRAMUSCULAR | Status: AC
  Filled 2019-12-21: qty 1

## 2019-12-21 MED ORDER — HYDROMORPHONE HCL 1 MG/ML IJ SOLN
0.2500 mg | INTRAMUSCULAR | Status: DC | PRN
  Administered 2019-12-21 (×2): 0.5 mg via INTRAVENOUS

## 2019-12-21 MED ORDER — BUPIVACAINE HCL (PF) 0.5 % IJ SOLN
INTRAMUSCULAR | Status: AC
  Filled 2019-12-21: qty 30

## 2019-12-21 MED ORDER — PROPOFOL 1000 MG/100ML IV EMUL
INTRAVENOUS | Status: AC
  Filled 2019-12-21: qty 100

## 2019-12-21 MED ORDER — 0.9 % SODIUM CHLORIDE (POUR BTL) OPTIME
TOPICAL | Status: DC | PRN
  Administered 2019-12-21: 1000 mL

## 2019-12-21 SURGICAL SUPPLY — 40 items
BLADE LONG MED 31X9 (MISCELLANEOUS) ×1 IMPLANT
BNDG ELASTIC 4X5.8 VLCR STR LF (GAUZE/BANDAGES/DRESSINGS) ×2 IMPLANT
BNDG GAUZE ELAST 4 BULKY (GAUZE/BANDAGES/DRESSINGS) ×2 IMPLANT
CANISTER WOUNDNEG PRESSURE 500 (CANNISTER) ×2 IMPLANT
COVER SURGICAL LIGHT HANDLE (MISCELLANEOUS) ×2 IMPLANT
COVER WAND RF STERILE (DRAPES) ×2 IMPLANT
CUFF TOURN SGL QUICK 18X4 (TOURNIQUET CUFF) ×1 IMPLANT
DRSG EMULSION OIL 3X3 NADH (GAUZE/BANDAGES/DRESSINGS) ×1 IMPLANT
DRSG VAC ATS MED SENSATRAC (GAUZE/BANDAGES/DRESSINGS) ×1 IMPLANT
ELECT REM PT RETURN 9FT ADLT (ELECTROSURGICAL) ×2
ELECTRODE REM PT RTRN 9FT ADLT (ELECTROSURGICAL) ×1 IMPLANT
GAUZE SPONGE 4X4 12PLY STRL (GAUZE/BANDAGES/DRESSINGS) ×1 IMPLANT
GLOVE BIO SURGEON STRL SZ7.5 (GLOVE) ×3 IMPLANT
GLOVE BIOGEL PI IND STRL 8 (GLOVE) ×1 IMPLANT
GLOVE BIOGEL PI INDICATOR 8 (GLOVE) ×1
GOWN STRL REUS W/ TWL LRG LVL3 (GOWN DISPOSABLE) ×1 IMPLANT
GOWN STRL REUS W/ TWL XL LVL3 (GOWN DISPOSABLE) ×1 IMPLANT
GOWN STRL REUS W/TWL LRG LVL3 (GOWN DISPOSABLE) ×2
GOWN STRL REUS W/TWL XL LVL3 (GOWN DISPOSABLE) ×2
HANDPIECE INTERPULSE COAX TIP (DISPOSABLE) ×2
KIT BASIN OR (CUSTOM PROCEDURE TRAY) ×2 IMPLANT
KIT TURNOVER KIT B (KITS) ×2 IMPLANT
NDL HYPO 25GX1X1/2 BEV (NEEDLE) ×1 IMPLANT
NEEDLE HYPO 25GX1X1/2 BEV (NEEDLE) ×2 IMPLANT
NS IRRIG 1000ML POUR BTL (IV SOLUTION) ×2 IMPLANT
PACK ORTHO EXTREMITY (CUSTOM PROCEDURE TRAY) ×2 IMPLANT
PAD ARMBOARD 7.5X6 YLW CONV (MISCELLANEOUS) ×2 IMPLANT
SET HNDPC FAN SPRY TIP SCT (DISPOSABLE) IMPLANT
SOL PREP POV-IOD 4OZ 10% (MISCELLANEOUS) ×2 IMPLANT
STAPLER VISISTAT 35W (STAPLE) ×1 IMPLANT
SUT ETHILON 2 0 PSLX (SUTURE) ×3 IMPLANT
SUT ETHILON 3 0 PS 1 (SUTURE) ×2 IMPLANT
SUT VIC AB 2-0 CT1 27 (SUTURE) ×4
SUT VIC AB 2-0 CT1 TAPERPNT 27 (SUTURE) IMPLANT
SUT VIC AB 3-0 CT1 27 (SUTURE) ×2
SUT VIC AB 3-0 CT1 TAPERPNT 27 (SUTURE) IMPLANT
SYR CONTROL 10ML LL (SYRINGE) ×2 IMPLANT
TOWEL GREEN STERILE (TOWEL DISPOSABLE) ×2 IMPLANT
TUBE CONNECTING 12X1/4 (SUCTIONS) ×2 IMPLANT
YANKAUER SUCT BULB TIP NO VENT (SUCTIONS) ×2 IMPLANT

## 2019-12-21 NOTE — Transfer of Care (Signed)
Immediate Anesthesia Transfer of Care Note  Patient: Eugene Diaz  Procedure(s) Performed: Chopart Amputation left foot (Left Foot) Application Of Wound Vac (Left Foot)  Patient Location: PACU  Anesthesia Type:MAC  Level of Consciousness: awake, alert  and oriented  Airway & Oxygen Therapy: Patient Spontanous Breathing  Post-op Assessment: Report given to RN and Post -op Vital signs reviewed and stable  Post vital signs: Reviewed and stable  Last Vitals:  Vitals Value Taken Time  BP 143/89 12/21/19 0919  Temp    Pulse 74 12/21/19 0919  Resp 16 12/21/19 0919  SpO2 99 % 12/21/19 0919  Vitals shown include unvalidated device data.  Last Pain:  Vitals:   12/20/19 2100  TempSrc: Oral  PainSc: 3          Complications: No apparent anesthesia complications

## 2019-12-21 NOTE — Progress Notes (Signed)
Pharmacy Antibiotic Note  Eugene Diaz is a 54 y.o. male admitted on 12/17/2019 with osteomyelitis.  Pharmacy has been consulted for vancomycin and Zosyn dosing.  Vanc peak 30, trough 17 >> AUC 595, above goal.  Plan: Change vancomycin to 1250mg  IV Q12H for calculated AUC 500. Continue Zosyn 3.375g IV Q8H.  Height: 6' 2.02" (188 cm) Weight: 270 lb 1 oz (122.5 kg) IBW/kg (Calculated) : 82.24  Temp (24hrs), Avg:97.9 F (36.6 C), Min:97.3 F (36.3 C), Max:98.4 F (36.9 C)  Recent Labs  Lab 12/17/19 1822 12/18/19 0820 12/19/19 0136 12/20/19 0452 12/21/19 0354 12/21/19 1432 12/21/19 2117  WBC 5.3 4.7 7.2 5.3 5.3  --   --   CREATININE 0.92 0.87 0.90 1.04 1.01  --   --   LATICACIDVEN 1.6  --   --   --   --   --   --   VANCOTROUGH  --   --   --   --   --   --  17  VANCOPEAK  --   --   --   --   --  30  --     Estimated Creatinine Clearance: 116.3 mL/min (by C-G formula based on SCr of 1.01 mg/dL).    No Known Allergies   Thank you for allowing pharmacy to be a part of this patient's care.  Wynona Neat, PharmD, BCPS  12/21/2019 11:47 PM

## 2019-12-21 NOTE — Op Note (Signed)
Patient Name: Eugene Diaz DOB: [birth year]  MRN: [id number]   Date of Service: 12/21/19  Surgeon: Dr. Hardie Pulley, DPM Assistants: None Pre-operative Diagnosis:  Osteomyelitis, wound dehiscence Post-operative Diagnosis:  same Procedures:  1) Chopart amputation left  2) Application of Wound VAC Pathology/Specimens: ID Type Source Tests Collected by Time Destination  1 : left forefoot Amputation Foot, Left SURGICAL PATHOLOGY Evelina Bucy, DPM 12/21/2019 0820    Anesthesia: MAC/local Hemostasis:  Total Tourniquet Time Documented: Calf (Left) - 71 minutes Total: Calf (Left) - 71 minutes  Estimated Blood Loss: 50 mL Materials: * No implants in log * Medications: 1g Vancomycin powder, topical. Complications: None  Indications for Procedure:  This is a 54 y.o. male with a chronic left foot wound with osteomyelitis. Below knee amputation would be the usual treatment for this, however patient asked for continued attempt at salvage. We discussed conversion to Chopart amputation. All risks, benefits, and alternatives were discussed. No guarantees were given.   Procedure in Detail: Patient was identified in pre-operative holding area. Formal consent was signed and the left lower extremity was marked. Patient was brought back to the operating room. Anesthesia was induced. The extremity was prepped and draped in the usual sterile fashion. Timeout was taken to confirm patient name, laterality, and procedure prior to incision.   Attention was then directed to the left foot. A fishmouth incision was made at the distal aspect of the foot through the previous amputation.  The wound was then copiously thoroughly pulse lavaged for a total of 3 L of normal saline.  Dissection was carried down through skin and subcutaneous tissue with care to avoid all vital neurovascular structures.  All bleeders were cauterized.  Dissection was carried down to level of the previous resection margin of the  lisfranc joint.  Full-thickness flaps were raised dorsally and plantarly.  Dissection was continued down to the level of the Chopart joint.  The cuneiforms, cuboid and navicular bone were disarticulated. The remaining calcaneus and talus appeared healthy and viable.  The wound was then copiously thoroughly pulse lavaged for a total of 2 additional L of normal saline.  The wound margins were then freshened and reapproximated.  Deep closure was performed with 2-0 and 3-0 Vicryl to minimize dead space.  The skin was closed in layers with 2-0 nylon and skin staples. A black sponge for a wound VAC was applied over the incision.  The wound VAC was then adhered with the adherent dressing, bridge dorsally and sets to 125 mm suction with good seal noted. The foot was then dressed with kerlix and ACE bandage. Patient tolerated the procedure well.   Disposition: Following a period of post-operative monitoring, patient will be transferred back to the floor. He will be strict NWB to the LLE. He will benefit from discharge to rehab facility. He will work with PT. He will benefit from [place] lengthening at a later date.

## 2019-12-21 NOTE — Anesthesia Preprocedure Evaluation (Signed)
Anesthesia Evaluation  Patient identified by MRN, date of birth, ID band Patient awake    Reviewed: Allergy & Precautions, NPO status , Patient's Chart, lab work & pertinent test results  Airway Mallampati: I  TM Distance: >3 FB Neck ROM: Full    Dental   Pulmonary former smoker,    Pulmonary exam normal        Cardiovascular hypertension, Pt. on medications Normal cardiovascular exam     Neuro/Psych Anxiety Depression    GI/Hepatic GERD  Medicated and Controlled,  Endo/Other  diabetes, Type 2  Renal/GU      Musculoskeletal   Abdominal   Peds  Hematology   Anesthesia Other Findings   Reproductive/Obstetrics                             Anesthesia Physical Anesthesia Plan  ASA: III  Anesthesia Plan: MAC   Post-op Pain Management:    Induction: Intravenous  PONV Risk Score and Plan: 1 and Ondansetron  Airway Management Planned: Nasal Cannula  Additional Equipment:   Intra-op Plan:   Post-operative Plan: Extubation in OR  Informed Consent: I have reviewed the patients History and Physical, chart, labs and discussed the procedure including the risks, benefits and alternatives for the proposed anesthesia with the patient or authorized representative who has indicated his/her understanding and acceptance.       Plan Discussed with: CRNA and Surgeon  Anesthesia Plan Comments:         Anesthesia Quick Evaluation

## 2019-12-21 NOTE — Progress Notes (Signed)
PROGRESS NOTE  Eugene Diaz RAX:[id number] DOB: Jul 08, 1966 DOA: 12/17/2019 PCP: [name], PA-C  HPI/Recap of past 24 hours: HPI from [name] is a 54 y.o. male with medical history significant for DM, GERD, peripheral neuropathy, left wound requiring midfoot Lisfranc amputation with [name] on November 13, 2019 presents to the emergency department with concern of wound infection.  Patient has a wound nurse who sees patient on Monday/Wednesday/Friday every other week for wound dressing changes. Pt noted to have foul-smelling draining discharge with redness extending up the leg, asked to go to the ED for further evaluation. In the ED, BP was elevated at 174/78, but other vital signs were stable. Labs showed normocytic anemia and hyperglycemia.  Left foot x-ray showed midfoot amputation with progressive soft tissue swelling and cortical erosion of the cuneiforms and possibly cuboid bones compatible with osteomyelitis and soft tissue infection. Patient's podiatrist ([name]) was consulted by EDP and recommended starting patient with IV vancomycin and Zosyn with plan to admit patient to hospitalist service at [facility].     Today, met pt after surgery, reports post op pain on LLE relieved with pain meds. Denies any other new complaints, denies chest pain, SOB, fever/chills.   Assessment/Plan: Principal Problem:   Osteomyelitis of ankle or foot, acute, left (HCC) Active Problems:   Diabetes mellitus without complication (HCC)   Hypertension   Hypercholesterolemia   Diabetic neuropathy (HCC)   GERD (gastroesophageal reflux disease)   Diabetic infection of left foot (HCC)   Anemia, chronic disease   Hyperglycemia   Left foot wound dehiscence and infection/Osteomyelitis s/p debridement on 12/18/19, s/p chopart amputation and application of wound vac on 12/21/19 by Dr [name] Currently afebrile with no leukocytosis BC x2 NGTD X-ray of left foot showed progressive  soft tissue swelling and cortical erosion compatible with osteomyelitis and soft tissue infection Bone culture grew MSSA, Enterococcus faecalis, Enterobacter cloacae Discussed with ID, Dr Tommy Medal on 12/21/19, can narrow down to Zosyn for now, however attempted to reach Dr [name] to discuss with about surgery/?clean margin, unavailable. Will attempt later  Podiatry, [name] on board Continue Vanc and Zosyn for now Wound care PT/OT Daily CBC  Chronic normocytic anemia/iron deficiency Likely 2/2 chronic disease Hemoglobin around baseline Anemia panel showed iron 49, sats 15, ferritin 91, folate 14.7, vitamin B12 84 Continue oral iron supplementation, start vitamin B12 supplementation Daily CBC  Diabetes mellitus type 2/neuropathy Last A1c 6.4 on 11/10/2019 SSI, Accu-Cheks, hypoglycemic protocol Continue gabapentin Hold home glimepiride  Hypertension Continue lisinopril, Lasix per home regimen  Hyperlipidemia Continue statins  GERD Continue Protonix  Anxiety/depression/grieving Denies any SI/HI Continue Xanax, Cymbalta, hydroxyzine Refusing psychiatry/mental health consultation  Obesity Lifestyle modification advised       Malnutrition Type:      Malnutrition Characteristics:      Nutrition Interventions:       Estimated body mass index is 34.66 kg/m as calculated from the following:   Height as of this encounter: 6' 2.02" (1.88 m).   Weight as of this encounter: 122.5 kg.     Code Status: Full  Family Communication: None at bedside  Disposition Plan: Likely SNF once work-up complete/consultant signing off   Consultants:  Podiatry, [name]  Procedures:  Wound debridement  Chopart amputation  Antimicrobials:  Vancomycin  Zosyn  DVT prophylaxis: SCD-post op   Objective: Vitals:   12/21/19 0407 12/21/19 0920 12/21/19 0935 12/21/19 1100  BP: (!) 141/78 (!) 143/89 (!) 159/91 134/72  Pulse: 69 74 73 70  Resp: _0 Temp:  98.1 F (36.7 C) 97.9 F (36.6 C) 98.1 F (36.7 C) (!) 97.3 F (36.3 C)  TempSrc:    Oral  SpO2: 94% 99% 98% 100%  Weight:      Height:        Intake/Output Summary (Last 24 hours) at 12/21/2019 1343 Last data filed at 12/21/2019 0900 Gross per 24 hour  Intake 490 ml  Output 4550 ml  Net -4060 ml   Filed Weights   12/17/19 2210 12/18/19 0000 12/18/19 1428  Weight: 122.5 kg 122.5 kg 122.5 kg    Exam:  General: NAD   Cardiovascular: S1, S2 present  Respiratory: CTAB  Abdomen: Soft, nontender, nondistended, bowel sounds present  Musculoskeletal: No bilateral pedal edema noted, L foot dressing C/D/I, wound VAC noted  Skin: Normal  Psychiatry: Normal mood    Data Reviewed: CBC: Recent Labs  Lab 12/17/19 1822 12/18/19 0820 12/19/19 0136 12/20/19 0452 12/21/19 0354  WBC 5.3 4.7 7.2 5.3 5.3  NEUTROABS 3.1 3.1 5.2 2.9 2.8  HGB 9.9* 9.8* 9.5* 9.4* 9.7*  HCT 31.4* 30.1* 29.9* 29.2* 29.7*  MCV 94.0 91.2 91.4 91.5 90.0  PLT 185 177 184 186 789   Basic Metabolic Panel: Recent Labs  Lab 12/17/19 1822 12/18/19 0820 12/19/19 0136 12/20/19 0452 12/21/19 0354  NA 136 142 138 138 139  K 4.0 4.1 3.7 3.9 4.6  CL 101 104 103 103 102  CO2 _1 GLUCOSE 169* 119* 136* 123* 142*  BUN 21* _2 CREATININE 0.92 0.87 0.90 1.04 1.01  CALCIUM 8.9 9.3 9.0 9.0 9.3   GFR: Estimated Creatinine Clearance: 116.3 mL/min (by C-G formula based on SCr of 1.01 mg/dL). Liver Function Tests: Recent Labs  Lab 12/17/19 1822  AST 17  ALT 11  ALKPHOS 102  BILITOT 0.9  PROT 7.5  ALBUMIN 3.5   No results for input(s): LIPASE, AMYLASE in the last 168 hours. No results for input(s): AMMONIA in the last 168 hours. Coagulation Profile: No results for input(s): INR, PROTIME in the last 168 hours. Cardiac Enzymes: No results for input(s): CKTOTAL, CKMB, CKMBINDEX, TROPONINI in the last 168 hours. BNP (last 3 results) No results for input(s): PROBNP in the last 8760  hours. HbA1C: No results for input(s): HGBA1C in the last 72 hours. CBG: Recent Labs  Lab 12/20/19 1645 12/20/19 2126 12/21/19 0541 12/21/19 0922 12/21/19 1108  GLUCAP 210* 181* 145* 147* 148*   Lipid Profile: No results for input(s): CHOL, HDL, LDLCALC, TRIG, CHOLHDL, LDLDIRECT in the last 72 hours. Thyroid Function Tests: No results for input(s): TSH, T4TOTAL, FREET4, T3FREE, THYROIDAB in the last 72 hours. Anemia Panel: Recent Labs    12/19/19 0136  VITAMINB12 84*  FOLATE 14.7  FERRITIN 91  TIBC 322  IRON 49   Urine analysis:    Component Value Date/Time   COLORURINE YELLOW 08/14/2019 [place] 08/14/2019 1232   LABSPEC 1.020 08/14/2019 1232   PHURINE 5.0 08/14/2019 1232   GLUCOSEU NEGATIVE 08/14/2019 1232   HGBUR NEGATIVE 08/14/2019 1232   BILIRUBINUR NEGATIVE 08/14/2019 1232   KETONESUR NEGATIVE 08/14/2019 1232   PROTEINUR NEGATIVE 08/14/2019 1232   UROBILINOGEN 4.0 (H) 10/09/2008 1114   NITRITE NEGATIVE 08/14/2019 1232   LEUKOCYTESUR NEGATIVE 08/14/2019 1232   Sepsis Labs: _3 (procalcitonin:4,lacticidven:4)  ) Recent Results (from the past 240 hour(s))  Culture, blood (routine x 2)  Status: None (Preliminary result)   Collection Time: 12/17/19  6:22 PM   Specimen: BLOOD RIGHT FOREARM  Result Value Ref Range Status   Specimen Description BLOOD RIGHT FOREARM  Final   Special Requests   Final    BOTTLES DRAWN AEROBIC ONLY Blood Culture adequate volume   Culture   Final    NO GROWTH 4 DAYS Performed at [facility], [address] [place], [place] [address]    Report Status PENDING  Incomplete  Culture, blood (routine x 2)     Status: None (Preliminary result)   Collection Time: 12/17/19  6:22 PM   Specimen: Right Antecubital; Blood  Result Value Ref Range Status   Specimen Description RIGHT ANTECUBITAL  Final   Special Requests   Final    BOTTLES DRAWN AEROBIC AND ANAEROBIC Blood Culture adequate volume   Culture   Final      NO GROWTH 4 DAYS Performed at [facility], [address], [place], [place] [address]    Report Status PENDING  Incomplete  Respiratory Panel by RT PCR (Flu A&B, Covid) - Nasopharyngeal Swab     Status: None   Collection Time: 12/17/19  7:42 PM   Specimen: Nasopharyngeal Swab  Result Value Ref Range Status   SARS Coronavirus 2 by RT PCR NEGATIVE NEGATIVE Final    Comment: (NOTE) SARS-CoV-2 target nucleic acids are NOT DETECTED. The SARS-CoV-2 RNA is generally detectable in upper respiratoy specimens during the acute phase of infection. The lowest concentration of SARS-CoV-2 viral copies this assay can detect is 131 copies/mL. A negative result does not preclude SARS-Cov-2 infection and should not be used as the sole basis for treatment or other patient management decisions. A negative result may occur with  improper specimen collection/handling, submission of specimen other than nasopharyngeal swab, presence of viral mutation(s) within the areas targeted by this assay, and inadequate number of viral copies (<131 copies/mL). A negative result must be combined with clinical observations, patient history, and epidemiological information. The expected result is Negative. Fact Sheet for Patients:  PinkCheek.be Fact Sheet for Healthcare Providers:  GravelBags.it This test is not yet ap proved or cleared by the [place] FDA and  has been authorized for detection and/or diagnosis of SARS-CoV-2 by FDA under an Emergency Use Authorization (EUA). This EUA will remain  in effect (meaning this test can be used) for the duration of the COVID-19 declaration under Section 564(b)(1) of the Act, 21 U.S.C. section 360bbb-3(b)(1), unless the authorization is terminated or revoked sooner.    Influenza A by PCR NEGATIVE NEGATIVE Final   Influenza B by PCR NEGATIVE NEGATIVE Final    Comment: (NOTE) The Xpert Xpress SARS-CoV-2/FLU/RSV  assay is intended as an aid in  the diagnosis of influenza from Nasopharyngeal swab specimens and  should not be used as a sole basis for treatment. Nasal washings and  aspirates are unacceptable for Xpert Xpress SARS-CoV-2/FLU/RSV  testing. Fact Sheet for Patients: PinkCheek.be Fact Sheet for Healthcare Providers: GravelBags.it This test is not yet approved or cleared by the [place] FDA and  has been authorized for detection and/or diagnosis of SARS-CoV-2 by  FDA under an Emergency Use Authorization (EUA). This EUA will remain  in effect (meaning this test can be used) for the duration of the  Covid-19 declaration under Section 564(b)(1) of the Act, 21  U.S.C. section 360bbb-3(b)(1), unless the authorization is  terminated or revoked. Performed at [facility], [address], [place], [place] [address]   Aerobic/Anaerobic Culture (  surgical/deep wound)     Status: None (Preliminary result)   Collection Time: 12/18/19  2:56 PM   Specimen: PATH Other; Tissue  Result Value Ref Range Status   Specimen Description WOUND LEFT FOOT  Final   Special Requests PT ON ZOSYN VANC  Final   Gram Stain   Final    RARE WBC PRESENT, PREDOMINANTLY PMN ABUNDANT GRAM POSITIVE COCCI ABUNDANT GRAM NEGATIVE RODS    Culture   Final    FEW ENTEROBACTER CLOACAE RARE STAPHYLOCOCCUS AUREUS FEW ENTEROCOCCUS FAECALIS HOLDING FOR POSSIBLE ANAEROBE ORG 2 REPEATING SUSCEPTIBILITIES TO FOLLOW Performed at [facility], [place] [address] [place], [place] [address]    Report Status PENDING  Incomplete   Organism ID, Bacteria ENTEROBACTER CLOACAE  Final   Organism ID, Bacteria ENTEROCOCCUS FAECALIS  Final      Susceptibility   Enterobacter cloacae - MIC*    CEFAZOLIN >=64 RESISTANT Resistant     CEFEPIME <=0.12 SENSITIVE Sensitive     CEFTAZIDIME <=1 SENSITIVE Sensitive     CIPROFLOXACIN <=0.25 SENSITIVE Sensitive     GENTAMICIN <=1 SENSITIVE  Sensitive     IMIPENEM 1 SENSITIVE Sensitive     TRIMETH/SULFA <=20 SENSITIVE Sensitive     PIP/TAZO <=4 SENSITIVE Sensitive     * FEW ENTEROBACTER CLOACAE   Enterococcus faecalis - MIC*    AMPICILLIN <=2 SENSITIVE Sensitive     VANCOMYCIN 1 SENSITIVE Sensitive     GENTAMICIN SYNERGY SENSITIVE Sensitive     * FEW ENTEROCOCCUS FAECALIS  Aerobic/Anaerobic Culture (surgical/deep wound)     Status: None (Preliminary result)   Collection Time: 12/18/19  3:08 PM   Specimen: Other Source; Tissue  Result Value Ref Range Status   Specimen Description BONE LEFT FOOT  Final   Special Requests PT ON ZOSYN VANC  Final   Gram Stain   Final    RARE WBC PRESENT, PREDOMINANTLY MONONUCLEAR FEW GRAM POSITIVE COCCI    Culture   Final    RARE STAPHYLOCOCCUS AUREUS FEW ENTEROBACTER CLOACAE FEW ENTEROCOCCUS FAECALIS HOLDING FOR POSSIBLE ANAEROBE Performed at [facility], [place] [address] [place], [place] [address]    Report Status PENDING  Incomplete   Organism ID, Bacteria STAPHYLOCOCCUS AUREUS  Final   Organism ID, Bacteria ENTEROBACTER CLOACAE  Final   Organism ID, Bacteria ENTEROCOCCUS FAECALIS  Final      Susceptibility   Enterobacter cloacae - MIC*    CEFAZOLIN >=64 RESISTANT Resistant     CEFEPIME <=0.12 SENSITIVE Sensitive     CEFTAZIDIME <=1 SENSITIVE Sensitive     CIPROFLOXACIN <=0.25 SENSITIVE Sensitive     GENTAMICIN <=1 SENSITIVE Sensitive     IMIPENEM 0.5 SENSITIVE Sensitive     TRIMETH/SULFA <=20 SENSITIVE Sensitive     PIP/TAZO <=4 SENSITIVE Sensitive     * FEW ENTEROBACTER CLOACAE   Enterococcus faecalis - MIC*    AMPICILLIN <=2 SENSITIVE Sensitive     VANCOMYCIN 1 SENSITIVE Sensitive     GENTAMICIN SYNERGY SENSITIVE Sensitive     * FEW ENTEROCOCCUS FAECALIS   Staphylococcus aureus - MIC*    CIPROFLOXACIN <=0.5 SENSITIVE Sensitive     ERYTHROMYCIN <=0.25 SENSITIVE Sensitive     GENTAMICIN <=0.5 SENSITIVE Sensitive     OXACILLIN 0.5 SENSITIVE Sensitive      TETRACYCLINE <=1 SENSITIVE Sensitive     VANCOMYCIN 1 SENSITIVE Sensitive     TRIMETH/SULFA <=10 SENSITIVE Sensitive     CLINDAMYCIN <=0.25 SENSITIVE Sensitive     RIFAMPIN <=0.5 SENSITIVE  Sensitive     Inducible Clindamycin NEGATIVE Sensitive     * RARE STAPHYLOCOCCUS AUREUS      Studies: DG Foot 2 Views Left  Result Date: 12/21/2019 CLINICAL DATA:  Reason for exam: post-op state EXAM: LEFT FOOT - 2 VIEW COMPARISON:  12/17/2019 and previous FINDINGS: There has been interval amputation just distal to the calcaneus and talus. Skin staples. There is some indistinctness of the distal cortical margin of the calcaneus. IMPRESSION: Postop changes of left foot amputation as above. Indistinctness of distal calcaneus cortex, cannot exclude osteomyelitis. MR may be useful for further evaluation. Electronically Signed   By: Lucrezia Europe M.D.   On: 12/21/2019 10:49    Scheduled Meds: . amitriptyline  100 mg Oral QHS  . atorvastatin  40 mg Oral QPM  . DULoxetine  60 mg Oral Daily  . ferrous sulfate  325 mg Oral Daily  . furosemide  20 mg Oral Daily  . gabapentin  900 mg Oral BID WC   And  . gabapentin  1,200 mg Oral QHS  . glycopyrrolate  1 mg Oral BID  . HYDROmorphone      . insulin aspart  0-15 Units Subcutaneous TID WC  . insulin aspart  0-5 Units Subcutaneous QHS  . lisinopril  20 mg Oral Daily  . pantoprazole  40 mg Oral Daily  . primidone  50 mg Oral BID  . vitamin B-12  1,000 mcg Oral Daily    Continuous Infusions: . piperacillin-tazobactam (ZOSYN)  IV 3.375 g (12/21/19 1246)  . vancomycin 1,500 mg (12/21/19 1033)     LOS: 4 days     Alma Friendly, MD [facility]  If 7PM-7AM, please contact night-coverage [email or web address] 12/21/2019, 1:43 PM

## 2019-12-21 NOTE — Progress Notes (Signed)
Left unit for OR

## 2019-12-21 NOTE — Anesthesia Postprocedure Evaluation (Signed)
Anesthesia Post Note  Patient: Eugene Diaz  Procedure(s) Performed: Chopart Amputation left foot (Left Foot) Application Of Wound Vac (Left Foot)     Patient location during evaluation: PACU Anesthesia Type: MAC Level of consciousness: awake and alert Pain management: pain level controlled Vital Signs Assessment: post-procedure vital signs reviewed and stable Respiratory status: spontaneous breathing, nonlabored ventilation, respiratory function stable and patient connected to nasal cannula oxygen Cardiovascular status: stable and blood pressure returned to baseline Postop Assessment: no apparent nausea or vomiting Anesthetic complications: no    Last Vitals:  Vitals:   12/21/19 0935 12/21/19 1100  BP: (!) 159/91 134/72  Pulse: 73 70  Resp: 13 17  Temp: 36.7 C (!) 36.3 C  SpO2: 98% 100%    Last Pain:  Vitals:   12/21/19 1100  TempSrc: Oral  PainSc:                  Krystalyn Kubota DAVID

## 2019-12-21 NOTE — Progress Notes (Signed)
Subjective:  Patient ID: MARKEVION HUXLEY, male    DOB: 1965-12-20,  MRN: [id number]  Patient seen in pre-op. Understands plan for OR. Objective:   Vitals:   12/20/19 2100 12/21/19 0407  BP: (!) 155/87 (!) 141/78  Pulse: 70 69  Resp: 18 18  Temp: 97.8 F (36.6 C) 98.1 F (36.7 C)  SpO2: 100% 94%   Left foot dressing c/d/i Calf supple, nontender No proximal erythema noted.  Results for orders placed or performed during the hospital encounter of 12/17/19 (from the past 24 hour(s))  Glucose, capillary     Status: Abnormal   Collection Time: 12/20/19 11:25 AM  Result Value Ref Range   Glucose-Capillary 163 (H) 70 - 99 mg/dL  Glucose, capillary     Status: Abnormal   Collection Time: 12/20/19  4:45 PM  Result Value Ref Range   Glucose-Capillary 210 (H) 70 - 99 mg/dL  Glucose, capillary     Status: Abnormal   Collection Time: 12/20/19  9:26 PM  Result Value Ref Range   Glucose-Capillary 181 (H) 70 - 99 mg/dL  CBC with Differential/Platelet     Status: Abnormal   Collection Time: 12/21/19  3:54 AM  Result Value Ref Range   WBC 5.3 4.0 - 10.5 K/uL   RBC 3.30 (L) 4.22 - 5.81 MIL/uL   Hemoglobin 9.7 (L) 13.0 - 17.0 g/dL   HCT 29.7 (L) 39.0 - 52.0 %   MCV 90.0 80.0 - 100.0 fL   MCH 29.4 26.0 - 34.0 pg   MCHC 32.7 30.0 - 36.0 g/dL   RDW 16.8 (H) 11.5 - 15.5 %   Platelets 200 150 - 400 K/uL   nRBC 0.0 0.0 - 0.2 %   Neutrophils Relative % 53 %   Neutro Abs 2.8 1.7 - 7.7 K/uL   Lymphocytes Relative 30 %   Lymphs Abs 1.6 0.7 - 4.0 K/uL   Monocytes Relative 6 %   Monocytes Absolute 0.3 0.1 - 1.0 K/uL   Eosinophils Relative 9 %   Eosinophils Absolute 0.5 0.0 - 0.5 K/uL   Basophils Relative 1 %   Basophils Absolute 0.1 0.0 - 0.1 K/uL   Immature Granulocytes 1 %   Abs Immature Granulocytes 0.03 0.00 - 0.07 K/uL  Basic metabolic panel     Status: Abnormal   Collection Time: 12/21/19  3:54 AM  Result Value Ref Range   Sodium 139 135 - 145 mmol/L   Potassium 4.6 3.5 - 5.1  mmol/L   Chloride 102 98 - 111 mmol/L   CO2 27 22 - 32 mmol/L   Glucose, Bld 142 (H) 70 - 99 mg/dL   BUN 13 6 - 20 mg/dL   Creatinine, Ser 1.01 0.61 - 1.24 mg/dL   Calcium 9.3 8.9 - 10.3 mg/dL   GFR calc non Af Amer >60 >60 mL/min   GFR calc Af Amer >60 >60 mL/min   Anion gap 10 5 - 15  Glucose, capillary     Status: Abnormal   Collection Time: 12/21/19  5:41 AM  Result Value Ref Range   Glucose-Capillary 145 (H) 70 - 99 mg/dL    Results for orders placed or performed during the hospital encounter of 12/17/19  Culture, blood (routine x 2)     Status: None (Preliminary result)   Collection Time: 12/17/19  6:22 PM   Specimen: BLOOD RIGHT FOREARM  Result Value Ref Range Status   Specimen Description BLOOD RIGHT FOREARM  Final   Special Requests   Final  BOTTLES DRAWN AEROBIC ONLY Blood Culture adequate volume   Culture   Final    NO GROWTH 4 DAYS Performed at [facility], [address] [place], [place] [address]    Report Status PENDING  Incomplete  Culture, blood (routine x 2)     Status: None (Preliminary result)   Collection Time: 12/17/19  6:22 PM   Specimen: Right Antecubital; Blood  Result Value Ref Range Status   Specimen Description RIGHT ANTECUBITAL  Final   Special Requests   Final    BOTTLES DRAWN AEROBIC AND ANAEROBIC Blood Culture adequate volume   Culture   Final    NO GROWTH 4 DAYS Performed at [facility], [address], [place], [place] [address]    Report Status PENDING  Incomplete  Respiratory Panel by RT PCR (Flu A&B, Covid) - Nasopharyngeal Swab     Status: None   Collection Time: 12/17/19  7:42 PM   Specimen: Nasopharyngeal Swab  Result Value Ref Range Status   SARS Coronavirus 2 by RT PCR NEGATIVE NEGATIVE Final    Comment: (NOTE) SARS-CoV-2 target nucleic acids are NOT DETECTED. The SARS-CoV-2 RNA is generally detectable in upper respiratoy specimens during the acute phase of infection. The lowest concentration of SARS-CoV-2 viral copies this  assay can detect is 131 copies/mL. A negative result does not preclude SARS-Cov-2 infection and should not be used as the sole basis for treatment or other patient management decisions. A negative result may occur with  improper specimen collection/handling, submission of specimen other than nasopharyngeal swab, presence of viral mutation(s) within the areas targeted by this assay, and inadequate number of viral copies (<131 copies/mL). A negative result must be combined with clinical observations, patient history, and epidemiological information. The expected result is Negative. Fact Sheet for Patients:  PinkCheek.be Fact Sheet for Healthcare Providers:  GravelBags.it This test is not yet ap proved or cleared by the [place] FDA and  has been authorized for detection and/or diagnosis of SARS-CoV-2 by FDA under an Emergency Use Authorization (EUA). This EUA will remain  in effect (meaning this test can be used) for the duration of the COVID-19 declaration under Section 564(b)(1) of the Act, 21 U.S.C. section 360bbb-3(b)(1), unless the authorization is terminated or revoked sooner.    Influenza A by PCR NEGATIVE NEGATIVE Final   Influenza B by PCR NEGATIVE NEGATIVE Final    Comment: (NOTE) The Xpert Xpress SARS-CoV-2/FLU/RSV assay is intended as an aid in  the diagnosis of influenza from Nasopharyngeal swab specimens and  should not be used as a sole basis for treatment. Nasal washings and  aspirates are unacceptable for Xpert Xpress SARS-CoV-2/FLU/RSV  testing. Fact Sheet for Patients: PinkCheek.be Fact Sheet for Healthcare Providers: GravelBags.it This test is not yet approved or cleared by the [place] FDA and  has been authorized for detection and/or diagnosis of SARS-CoV-2 by  FDA under an Emergency Use Authorization (EUA). This EUA will remain  in  effect (meaning this test can be used) for the duration of the  Covid-19 declaration under Section 564(b)(1) of the Act, 21  U.S.C. section 360bbb-3(b)(1), unless the authorization is  terminated or revoked. Performed at [facility], [address], [place], [place] [address]   Aerobic/Anaerobic Culture (surgical/deep wound)     Status: None (Preliminary result)   Collection Time: 12/18/19  2:56 PM   Specimen: PATH Other; Tissue  Result Value Ref Range Status   Specimen Description WOUND LEFT FOOT  Final   Special Requests PT  ON ZOSYN VANC  Final   Gram Stain   Final    RARE WBC PRESENT, PREDOMINANTLY PMN ABUNDANT GRAM POSITIVE COCCI ABUNDANT GRAM NEGATIVE RODS    Culture   Final    FEW GRAM NEGATIVE RODS RARE STAPHYLOCOCCUS AUREUS CULTURE REINCUBATED FOR BETTER GROWTH HOLDING FOR POSSIBLE ANAEROBE Performed at [facility], [place] [address], [place], [place] [address]    Report Status PENDING  Incomplete  Aerobic/Anaerobic Culture (surgical/deep wound)     Status: None (Preliminary result)   Collection Time: 12/18/19  3:08 PM   Specimen: Other Source; Tissue  Result Value Ref Range Status   Specimen Description BONE LEFT FOOT  Final   Special Requests PT ON ZOSYN VANC  Final   Gram Stain   Final    RARE WBC PRESENT, PREDOMINANTLY MONONUCLEAR FEW GRAM POSITIVE COCCI    Culture   Final    RARE STAPHYLOCOCCUS AUREUS FEW GRAM NEGATIVE RODS CULTURE REINCUBATED FOR BETTER GROWTH HOLDING FOR POSSIBLE ANAEROBE Performed at [facility], [place] [address], [place], [place] [address]    Report Status PENDING  Incomplete     Assessment & Plan:  Patient was evaluated and treated and all questions answered.  Left foot wound dehiscence, osteomyelitis -S/p debridement and irrigation -Bone and ST cultures pending, S. Aureus and GNRs -Or today for conversion to Chopart amputation  -Would benefit from rehab placement at d/c.  -Will continue to follow.  Evelina Bucy,  DPM  Accessible via secure chat for questions or concerns.

## 2019-12-22 LAB — BASIC METABOLIC PANEL
Anion gap: 11 (ref 5–15)
BUN: 10 mg/dL (ref 6–20)
CO2: 26 mmol/L (ref 22–32)
Calcium: 8.9 mg/dL (ref 8.9–10.3)
Chloride: 100 mmol/L (ref 98–111)
Creatinine, Ser: 0.97 mg/dL (ref 0.61–1.24)
GFR calc Af Amer: >60 mL/min (ref >60)
GFR calc non Af Amer: >60 mL/min (ref >60)
Glucose, Bld: 175 mg/dL — ABNORMAL HIGH (ref 70–99)
Potassium: 4 mmol/L (ref 3.5–5.1)
Sodium: 137 mmol/L (ref 135–145)

## 2019-12-22 LAB — CBC WITH DIFFERENTIAL/PLATELET
Abs Immature Granulocytes: 0.04 10*3/uL (ref 0.00–0.07)
Basophils Absolute: 0.1 10*3/uL (ref 0.0–0.1)
Basophils Relative: 1 %
Eosinophils Absolute: 0.4 10*3/uL (ref 0.0–0.5)
Eosinophils Relative: 5 %
HCT: 28.1 % — ABNORMAL LOW (ref 39.0–52.0)
Hemoglobin: 9.1 g/dL — ABNORMAL LOW (ref 13.0–17.0)
Immature Granulocytes: 1 %
Lymphocytes Relative: 18 %
Lymphs Abs: 1.3 10*3/uL (ref 0.7–4.0)
MCH: 29.4 pg (ref 26.0–34.0)
MCHC: 32.4 g/dL (ref 30.0–36.0)
MCV: 90.6 fL (ref 80.0–100.0)
Monocytes Absolute: 0.5 10*3/uL (ref 0.1–1.0)
Monocytes Relative: 7 %
Neutro Abs: 4.7 10*3/uL (ref 1.7–7.7)
Neutrophils Relative %: 68 %
Platelets: 208 10*3/uL (ref 150–400)
RBC: 3.1 MIL/uL — ABNORMAL LOW (ref 4.22–5.81)
RDW: 17 % — ABNORMAL HIGH (ref 11.5–15.5)
WBC: 6.9 10*3/uL (ref 4.0–10.5)
nRBC: 0 % (ref 0.0–0.2)

## 2019-12-22 LAB — CULTURE, BLOOD (ROUTINE X 2)
Culture: NO GROWTH
Culture: NO GROWTH
Special Requests: ADEQUATE
Special Requests: ADEQUATE

## 2019-12-22 LAB — AEROBIC/ANAEROBIC CULTURE W GRAM STAIN (SURGICAL/DEEP WOUND)

## 2019-12-22 LAB — GLUCOSE, CAPILLARY
Glucose-Capillary: 151 mg/dL — ABNORMAL HIGH (ref 70–99)
Glucose-Capillary: 137 mg/dL — ABNORMAL HIGH (ref 70–99)
Glucose-Capillary: 186 mg/dL — ABNORMAL HIGH (ref 70–99)
Glucose-Capillary: 222 mg/dL — ABNORMAL HIGH (ref 70–99)

## 2019-12-22 NOTE — Progress Notes (Signed)
PROGRESS NOTE  Eugene Diaz [id number] DOB: May 11, 1966 DOA: 12/17/2019 PCP: [name], PA-C  HPI/Recap of past 24 hours: HPI from [name] is a 54 y.o. male with medical history significant for DM, GERD, peripheral neuropathy, left wound requiring midfoot Lisfranc amputation with [name] on November 13, 2019 presents to the emergency department with concern of wound infection.  Patient has a wound nurse who sees patient on Monday/Wednesday/Friday every other week for wound dressing changes. Pt noted to have foul-smelling draining discharge with redness extending up the leg, asked to go to the ED for further evaluation. In the ED, BP was elevated at 174/78, but other vital signs were stable. Labs showed normocytic anemia and hyperglycemia.  Left foot x-ray showed midfoot amputation with progressive soft tissue swelling and cortical erosion of the cuneiforms and possibly cuboid bones compatible with osteomyelitis and soft tissue infection. Patient's podiatrist ([name]) was consulted by EDP and recommended starting patient with IV vancomycin and Zosyn with plan to admit patient to hospitalist service at [facility].     Today, pt reports post op pain relieved with pain meds, otherwise denies any new complaints.    Assessment/Plan: Principal Problem:   Osteomyelitis of ankle or foot, acute, left (HCC) Active Problems:   Diabetes mellitus without complication (HCC)   Hypertension   Hypercholesterolemia   Diabetic neuropathy (HCC)   GERD (gastroesophageal reflux disease)   Diabetic infection of left foot (HCC)   Anemia, chronic disease   Hyperglycemia   Left foot wound dehiscence and infection/Osteomyelitis s/p debridement on 12/18/19, s/p chopart amputation and application of wound vac on 12/21/19 by Dr [name] Currently afebrile with no leukocytosis BC x2 NGTD X-ray of left foot showed progressive soft tissue swelling and cortical erosion compatible with  osteomyelitis and soft tissue infection Bone culture grew MSSA, Enterococcus faecalis, Enterobacter cloacae Discussed with ID, Dr Tommy Medal on 12/21/19, can narrow down to Zosyn for now, however attempted to reach Dr [name] to discuss with about surgery/?clean margin, unavailable, awaiting response Podiatry, [name] on board Continue Vanc and Zosyn for now Wound care PT/OT Daily CBC  Chronic normocytic anemia/iron deficiency Likely 2/2 chronic disease Hemoglobin around baseline Anemia panel showed iron 49, sats 15, ferritin 91, folate 14.7, vitamin B12 84 Continue oral iron supplementation, start vitamin B12 supplementation Daily CBC  Diabetes mellitus type 2/neuropathy Last A1c 6.4 on 11/10/2019 SSI, Accu-Cheks, hypoglycemic protocol Continue gabapentin Hold home glimepiride  Hypertension Continue lisinopril, Lasix per home regimen  Hyperlipidemia Continue statins  GERD Continue Protonix  Anxiety/depression/grieving Denies any SI/HI Continue Xanax, Cymbalta, hydroxyzine Refusing psychiatry/mental health consultation  Obesity Lifestyle modification advised       Malnutrition Type:      Malnutrition Characteristics:      Nutrition Interventions:       Estimated body mass index is 34.66 kg/m as calculated from the following:   Height as of this encounter: 6' 2.02" (1.88 m).   Weight as of this encounter: 122.5 kg.     Code Status: Full  Family Communication: None at bedside  Disposition Plan: Likely SNF once work-up complete/consultant signing off   Consultants:  Podiatry, [name]  Procedures:  Wound debridement  Chopart amputation  Antimicrobials:  Vancomycin  Zosyn  DVT prophylaxis: SCD-post op   Objective: Vitals:   12/21/19 2337 12/22/19 0321 12/22/19 0801 12/22/19 1506  BP: (!) 156/73 (!) 151/80 (!) 154/75 (!) 157/87  Pulse: 87 83 90 87  Resp: 16 14  19 19  Temp: 97.7 F (36.5 C) 99 F (37.2 C) 98.2 F (36.8 C) 99 F  (37.2 C)  TempSrc: Oral Oral  Oral  SpO2: 99% 95% 99% 100%  Weight:      Height:        Intake/Output Summary (Last 24 hours) at 12/22/2019 1608 Last data filed at 12/22/2019 1300 Gross per 24 hour  Intake 1487 ml  Output 5225 ml  Net -3738 ml   Filed Weights   12/17/19 2210 12/18/19 0000 12/18/19 1428  Weight: 122.5 kg 122.5 kg 122.5 kg    Exam:  General: NAD   Cardiovascular: S1, S2 present  Respiratory: CTAB  Abdomen: Soft, nontender, nondistended, bowel sounds present  Musculoskeletal: No bilateral pedal edema noted, left foot dressing C/D/Bi, wound VAC noted  Skin: Normal  Psychiatry: Normal mood    Data Reviewed: CBC: Recent Labs  Lab 12/18/19 0820 12/19/19 0136 12/20/19 0452 12/21/19 0354 12/22/19 0403  WBC 4.7 7.2 5.3 5.3 6.9  NEUTROABS 3.1 5.2 2.9 2.8 4.7  HGB 9.8* 9.5* 9.4* 9.7* 9.1*  HCT 30.1* 29.9* 29.2* 29.7* 28.1*  MCV 91.2 91.4 91.5 90.0 90.6  PLT 177 184 186 200 [id number]   Basic Metabolic Panel: Recent Labs  Lab 12/18/19 0820 12/19/19 0136 12/20/19 0452 12/21/19 0354 12/22/19 0403  NA 142 138 138 139 137  K 4.1 3.7 3.9 4.6 4.0  CL 104 103 103 102 100  CO2 27 25 26 27 26   GLUCOSE 119* 136* 123* 142* 175*  BUN 13 11 9 13 10   CREATININE 0.87 0.90 1.04 1.01 0.97  CALCIUM 9.3 9.0 9.0 9.3 8.9   GFR: Estimated Creatinine Clearance: 121 mL/min (by C-G formula based on SCr of 0.97 mg/dL). Liver Function Tests: Recent Labs  Lab 12/17/19 1822  AST 17  ALT 11  ALKPHOS 102  BILITOT 0.9  PROT 7.5  ALBUMIN 3.5   No results for input(s): LIPASE, AMYLASE in the last 168 hours. No results for input(s): AMMONIA in the last 168 hours. Coagulation Profile: No results for input(s): INR, PROTIME in the last 168 hours. Cardiac Enzymes: No results for input(s): CKTOTAL, CKMB, CKMBINDEX, TROPONINI in the last 168 hours. BNP (last 3 results) No results for input(s): PROBNP in the last 8760 hours. HbA1C: No results for input(s): HGBA1C in the  last 72 hours. CBG: Recent Labs  Lab 12/21/19 1108 12/21/19 1606 12/21/19 2113 12/22/19 0645 12/22/19 1136  GLUCAP 148* 141* 150* 151* 137*   Lipid Profile: No results for input(s): CHOL, HDL, LDLCALC, TRIG, CHOLHDL, LDLDIRECT in the last 72 hours. Thyroid Function Tests: No results for input(s): TSH, T4TOTAL, FREET4, T3FREE, THYROIDAB in the last 72 hours. Anemia Panel: No results for input(s): VITAMINB12, FOLATE, FERRITIN, TIBC, IRON, RETICCTPCT in the last 72 hours. Urine analysis:    Component Value Date/Time   COLORURINE YELLOW 08/14/2019 [place] 08/14/2019 1232   LABSPEC 1.020 08/14/2019 1232   PHURINE 5.0 08/14/2019 1232   GLUCOSEU NEGATIVE 08/14/2019 1232   HGBUR NEGATIVE 08/14/2019 [place] 08/14/2019 1232   KETONESUR NEGATIVE 08/14/2019 1232   PROTEINUR NEGATIVE 08/14/2019 1232   UROBILINOGEN 4.0 (H) 10/09/2008 1114   NITRITE NEGATIVE 08/14/2019 1232   LEUKOCYTESUR NEGATIVE 08/14/2019 1232   Sepsis Labs: @LABRCNTIP (procalcitonin:4,lacticidven:4)  ) Recent Results (from the past 240 hour(s))  Culture, blood (routine x 2)     Status: None   Collection Time: 12/17/19  6:22 PM   Specimen: BLOOD RIGHT FOREARM  Result Value  Ref Range Status   Specimen Description BLOOD RIGHT FOREARM  Final   Special Requests   Final    BOTTLES DRAWN AEROBIC ONLY Blood Culture adequate volume   Culture   Final    NO GROWTH 5 DAYS Performed at [facility], [address] [place], [place] [address]    Report Status 12/22/2019 FINAL  Final  Culture, blood (routine x 2)     Status: None   Collection Time: 12/17/19  6:22 PM   Specimen: Right Antecubital; Blood  Result Value Ref Range Status   Specimen Description RIGHT ANTECUBITAL  Final   Special Requests   Final    BOTTLES DRAWN AEROBIC AND ANAEROBIC Blood Culture adequate volume   Culture   Final    NO GROWTH 5 DAYS Performed at [facility], [address] [place], [place] [address]      Report Status 12/22/2019 FINAL  Final  Respiratory Panel by RT PCR (Flu A&B, Covid) - Nasopharyngeal Swab     Status: None   Collection Time: 12/17/19  7:42 PM   Specimen: Nasopharyngeal Swab  Result Value Ref Range Status   SARS Coronavirus 2 by RT PCR NEGATIVE NEGATIVE Final    Comment: (NOTE) SARS-CoV-2 target nucleic acids are NOT DETECTED. The SARS-CoV-2 RNA is generally detectable in upper respiratoy specimens during the acute phase of infection. The lowest concentration of SARS-CoV-2 viral copies this assay can detect is 131 copies/mL. A negative result does not preclude SARS-Cov-2 infection and should not be used as the sole basis for treatment or other patient management decisions. A negative result may occur with  improper specimen collection/handling, submission of specimen other than nasopharyngeal swab, presence of viral mutation(s) within the areas targeted by this assay, and inadequate number of viral copies (<131 copies/mL). A negative result must be combined with clinical observations, patient history, and epidemiological information. The expected result is Negative. Fact Sheet for Patients:  PinkCheek.be Fact Sheet for Healthcare Providers:  GravelBags.it This test is not yet ap proved or cleared by the [place] FDA and  has been authorized for detection and/or diagnosis of SARS-CoV-2 by FDA under an Emergency Use Authorization (EUA). This EUA will remain  in effect (meaning this test can be used) for the duration of the COVID-19 declaration under Section 564(b)(1) of the Act, 21 U.S.C. section 360bbb-3(b)(1), unless the authorization is terminated or revoked sooner.    Influenza A by PCR NEGATIVE NEGATIVE Final   Influenza B by PCR NEGATIVE NEGATIVE Final    Comment: (NOTE) The Xpert Xpress SARS-CoV-2/FLU/RSV assay is intended as an aid in  the diagnosis of influenza from Nasopharyngeal swab  specimens and  should not be used as a sole basis for treatment. Nasal washings and  aspirates are unacceptable for Xpert Xpress SARS-CoV-2/FLU/RSV  testing. Fact Sheet for Patients: PinkCheek.be Fact Sheet for Healthcare Providers: GravelBags.it This test is not yet approved or cleared by the [place] FDA and  has been authorized for detection and/or diagnosis of SARS-CoV-2 by  FDA under an Emergency Use Authorization (EUA). This EUA will remain  in effect (meaning this test can be used) for the duration of the  Covid-19 declaration under Section 564(b)(1) of the Act, 21  U.S.C. section 360bbb-3(b)(1), unless the authorization is  terminated or revoked. Performed at [facility], [address] [place], [place] [address]   Aerobic/Anaerobic Culture (surgical/deep wound)     Status: None   Collection Time: 12/18/19  2:56 PM   Specimen: PATH Other;  Tissue  Result Value Ref Range Status   Specimen Description WOUND LEFT FOOT  Final   Special Requests PT ON ZOSYN VANC  Final   Gram Stain   Final    RARE WBC PRESENT, PREDOMINANTLY PMN ABUNDANT GRAM POSITIVE COCCI ABUNDANT GRAM NEGATIVE RODS    Culture   Final    FEW ENTEROBACTER CLOACAE RARE STAPHYLOCOCCUS AUREUS FEW ENTEROCOCCUS FAECALIS FEW BACTEROIDES SPECIES BETA LACTAMASE NEGATIVE Performed at [facility], [place] [address], [place], [place] [address]    Report Status 12/22/2019 FINAL  Final   Organism ID, Bacteria ENTEROBACTER CLOACAE  Final   Organism ID, Bacteria ENTEROCOCCUS FAECALIS  Final   Organism ID, Bacteria STAPHYLOCOCCUS AUREUS  Final      Susceptibility   Enterobacter cloacae - MIC*    CEFAZOLIN >=64 RESISTANT Resistant     CEFEPIME <=0.12 SENSITIVE Sensitive     CEFTAZIDIME <=1 SENSITIVE Sensitive     CIPROFLOXACIN <=0.25 SENSITIVE Sensitive     GENTAMICIN <=1 SENSITIVE Sensitive     IMIPENEM 1 SENSITIVE Sensitive     TRIMETH/SULFA <=20  SENSITIVE Sensitive     PIP/TAZO <=4 SENSITIVE Sensitive     * FEW ENTEROBACTER CLOACAE   Enterococcus faecalis - MIC*    AMPICILLIN <=2 SENSITIVE Sensitive     VANCOMYCIN 1 SENSITIVE Sensitive     GENTAMICIN SYNERGY SENSITIVE Sensitive     * FEW ENTEROCOCCUS FAECALIS   Staphylococcus aureus - MIC*    CIPROFLOXACIN <=0.5 SENSITIVE Sensitive     ERYTHROMYCIN <=0.25 SENSITIVE Sensitive     GENTAMICIN <=0.5 SENSITIVE Sensitive     OXACILLIN 0.5 SENSITIVE Sensitive     TETRACYCLINE <=1 SENSITIVE Sensitive     VANCOMYCIN 1 SENSITIVE Sensitive     TRIMETH/SULFA <=10 SENSITIVE Sensitive     CLINDAMYCIN <=0.25 SENSITIVE Sensitive     RIFAMPIN <=0.5 SENSITIVE Sensitive     Inducible Clindamycin NEGATIVE Sensitive     * RARE STAPHYLOCOCCUS AUREUS  Aerobic/Anaerobic Culture (surgical/deep wound)     Status: None (Preliminary result)   Collection Time: 12/18/19  3:08 PM   Specimen: Other Source; Tissue  Result Value Ref Range Status   Specimen Description BONE LEFT FOOT  Final   Special Requests PT ON ZOSYN VANC  Final   Gram Stain   Final    RARE WBC PRESENT, PREDOMINANTLY MONONUCLEAR FEW GRAM POSITIVE COCCI    Culture   Final    RARE STAPHYLOCOCCUS AUREUS FEW ENTEROBACTER CLOACAE FEW ENTEROCOCCUS FAECALIS ABUNDANT BACTEROIDES SPECIES BETA LACTAMASE NEGATIVE Performed at [facility], [place] [address] [place], [place] [address]    Report Status PENDING  Incomplete   Organism ID, Bacteria STAPHYLOCOCCUS AUREUS  Final   Organism ID, Bacteria ENTEROBACTER CLOACAE  Final   Organism ID, Bacteria ENTEROCOCCUS FAECALIS  Final      Susceptibility   Enterobacter cloacae - MIC*    CEFAZOLIN >=64 RESISTANT Resistant     CEFEPIME <=0.12 SENSITIVE Sensitive     CEFTAZIDIME <=1 SENSITIVE Sensitive     CIPROFLOXACIN <=0.25 SENSITIVE Sensitive     GENTAMICIN <=1 SENSITIVE Sensitive     IMIPENEM 0.5 SENSITIVE Sensitive     TRIMETH/SULFA <=20 SENSITIVE Sensitive     PIP/TAZO <=4 SENSITIVE  Sensitive     * FEW ENTEROBACTER CLOACAE   Enterococcus faecalis - MIC*    AMPICILLIN <=2 SENSITIVE Sensitive     VANCOMYCIN 1 SENSITIVE Sensitive     GENTAMICIN SYNERGY SENSITIVE Sensitive     * FEW  ENTEROCOCCUS FAECALIS   Staphylococcus aureus - MIC*    CIPROFLOXACIN <=0.5 SENSITIVE Sensitive     ERYTHROMYCIN <=0.25 SENSITIVE Sensitive     GENTAMICIN <=0.5 SENSITIVE Sensitive     OXACILLIN 0.5 SENSITIVE Sensitive     TETRACYCLINE <=1 SENSITIVE Sensitive     VANCOMYCIN 1 SENSITIVE Sensitive     TRIMETH/SULFA <=10 SENSITIVE Sensitive     CLINDAMYCIN <=0.25 SENSITIVE Sensitive     RIFAMPIN <=0.5 SENSITIVE Sensitive     Inducible Clindamycin NEGATIVE Sensitive     * RARE STAPHYLOCOCCUS AUREUS      Studies: No results found.  Scheduled Meds: . amitriptyline  100 mg Oral QHS  . atorvastatin  40 mg Oral QPM  . DULoxetine  60 mg Oral Daily  . ferrous sulfate  325 mg Oral Daily  . furosemide  20 mg Oral Daily  . gabapentin  900 mg Oral BID WC   And  . gabapentin  1,200 mg Oral QHS  . glycopyrrolate  1 mg Oral BID  . insulin aspart  0-15 Units Subcutaneous TID WC  . insulin aspart  0-5 Units Subcutaneous QHS  . lisinopril  20 mg Oral Daily  . pantoprazole  40 mg Oral Daily  . primidone  50 mg Oral BID  . vitamin B-12  1,000 mcg Oral Daily    Continuous Infusions: . piperacillin-tazobactam (ZOSYN)  IV 3.375 g (12/22/19 1252)  . vancomycin 1,250 mg (12/22/19 1101)     LOS: 5 days     Alma Friendly, MD [facility]  If 7PM-7AM, please contact night-coverage [email or web address] 12/22/2019, 4:08 PM

## 2019-12-23 ENCOUNTER — Telehealth: Payer: Self-pay | Admitting: *Deleted

## 2019-12-23 LAB — CBC WITH DIFFERENTIAL/PLATELET
Abs Immature Granulocytes: 0.04 10*3/uL (ref 0.00–0.07)
Basophils Absolute: 0 10*3/uL (ref 0.0–0.1)
Basophils Relative: 1 %
Eosinophils Absolute: 0.3 10*3/uL (ref 0.0–0.5)
Eosinophils Relative: 5 %
HCT: 27 % — ABNORMAL LOW (ref 39.0–52.0)
Hemoglobin: 8.6 g/dL — ABNORMAL LOW (ref 13.0–17.0)
Immature Granulocytes: 1 %
Lymphocytes Relative: 22 %
Lymphs Abs: 1.5 10*3/uL (ref 0.7–4.0)
MCH: 29.4 pg (ref 26.0–34.0)
MCHC: 31.9 g/dL (ref 30.0–36.0)
MCV: 92.2 fL (ref 80.0–100.0)
Monocytes Absolute: 0.5 10*3/uL (ref 0.1–1.0)
Monocytes Relative: 8 %
Neutro Abs: 4.4 10*3/uL (ref 1.7–7.7)
Neutrophils Relative %: 63 %
Platelets: 180 10*3/uL (ref 150–400)
RBC: 2.93 MIL/uL — ABNORMAL LOW (ref 4.22–5.81)
RDW: 17 % — ABNORMAL HIGH (ref 11.5–15.5)
WBC: 6.8 10*3/uL (ref 4.0–10.5)
nRBC: 0 % (ref 0.0–0.2)

## 2019-12-23 LAB — AEROBIC/ANAEROBIC CULTURE W GRAM STAIN (SURGICAL/DEEP WOUND)

## 2019-12-23 LAB — BASIC METABOLIC PANEL
Anion gap: 10 (ref 5–15)
BUN: 12 mg/dL (ref 6–20)
CO2: 26 mmol/L (ref 22–32)
Calcium: 9.1 mg/dL (ref 8.9–10.3)
Chloride: 99 mmol/L (ref 98–111)
Creatinine, Ser: 1.13 mg/dL (ref 0.61–1.24)
GFR calc Af Amer: >60 mL/min (ref >60)
GFR calc non Af Amer: >60 mL/min (ref >60)
Glucose, Bld: 188 mg/dL — ABNORMAL HIGH (ref 70–99)
Potassium: 3.9 mmol/L (ref 3.5–5.1)
Sodium: 135 mmol/L (ref 135–145)

## 2019-12-23 LAB — GLUCOSE, CAPILLARY
Glucose-Capillary: 152 mg/dL — ABNORMAL HIGH (ref 70–99)
Glucose-Capillary: 178 mg/dL — ABNORMAL HIGH (ref 70–99)
Glucose-Capillary: 211 mg/dL — ABNORMAL HIGH (ref 70–99)
Glucose-Capillary: 210 mg/dL — ABNORMAL HIGH (ref 70–99)

## 2019-12-23 MED ORDER — ENOXAPARIN SODIUM 40 MG/0.4ML ~~LOC~~ SOLN
40.0000 mg | SUBCUTANEOUS | Status: DC
  Administered 2019-12-23 – 2019-12-27 (×5): 40 mg via SUBCUTANEOUS
  Filled 2019-12-23 (×5): qty 0.4

## 2019-12-23 MED ORDER — HYDRALAZINE HCL 25 MG PO TABS
25.0000 mg | ORAL_TABLET | Freq: Once | ORAL | Status: AC
  Administered 2019-12-23: 25 mg via ORAL
  Filled 2019-12-23: qty 1

## 2019-12-23 NOTE — Progress Notes (Signed)
PROGRESS NOTE  Eugene Diaz [id number] DOB: Jun 02, 1966 DOA: 12/17/2019 PCP: [name], PA-C  HPI/Recap of past 24 hours: HPI from [name] is a 54 y.o. male with medical history significant for DM, GERD, peripheral neuropathy, left wound requiring midfoot Lisfranc amputation with [name] on November 13, 2019 presents to the emergency department with concern of wound infection.  Patient has a wound nurse who sees patient on Monday/Wednesday/Friday every other week for wound dressing changes. Pt noted to have foul-smelling draining discharge with redness extending up the leg, asked to go to the ED for further evaluation. In the ED, BP was elevated at 174/78, but other vital signs were stable. Labs showed normocytic anemia and hyperglycemia.  Left foot x-ray showed midfoot amputation with progressive soft tissue swelling and cortical erosion of the cuneiforms and possibly cuboid bones compatible with osteomyelitis and soft tissue infection. Patient's podiatrist ([name]) was consulted by EDP and recommended starting patient with IV vancomycin and Zosyn with plan to admit patient to hospitalist service at [facility].     Today, patient denies any new complaints.    Assessment/Plan: Principal Problem:   Osteomyelitis of ankle or foot, acute, left (HCC) Active Problems:   Diabetes mellitus without complication (HCC)   Hypertension   Hypercholesterolemia   Diabetic neuropathy (HCC)   GERD (gastroesophageal reflux disease)   Diabetic infection of left foot (HCC)   Anemia, chronic disease   Hyperglycemia   Left foot wound dehiscence and infection/Osteomyelitis s/p debridement on 12/18/19, s/p chopart amputation and application of wound vac on 12/21/19 by Dr [name] Currently afebrile with no leukocytosis BC x2 NGTD X-ray of left foot showed progressive soft tissue swelling and cortical erosion compatible with osteomyelitis and soft tissue infection Bone  culture grew MSSA, Enterococcus faecalis, Enterobacter cloacae Discussed with ID, Dr Tommy Medal on 12/21/19, can narrow down to [facility], [name] on board, discussed with him on 12/23/19, will need 4 weeks of IV antibiotics Continue Zosyn Wound care PT/OT Daily CBC  Chronic normocytic anemia/iron deficiency Likely 2/2 chronic disease Hemoglobin around baseline Anemia panel showed iron 49, sats 15, ferritin 91, folate 14.7, vitamin B12 84 Continue oral iron supplementation, start vitamin B12 supplementation Daily CBC  Diabetes mellitus type 2/neuropathy Last A1c 6.4 on 11/10/2019 SSI, Accu-Cheks, hypoglycemic protocol Continue gabapentin Hold home glimepiride  Hypertension Continue lisinopril, Lasix per home regimen  Hyperlipidemia Continue statins  GERD Continue Protonix  Anxiety/depression/grieving Denies any SI/HI Continue Xanax, Cymbalta, hydroxyzine  Obesity Lifestyle modification advised       Malnutrition Type:      Malnutrition Characteristics:      Nutrition Interventions:       Estimated body mass index is 34.66 kg/m as calculated from the following:   Height as of this encounter: 6' 2.02" (1.88 m).   Weight as of this encounter: 122.5 kg.     Code Status: Full  Family Communication: None at bedside  Disposition Plan: Likely SNF once work-up complete/consultant signing off, awaiting bed   Consultants:  Podiatry, [name]  Procedures:  Wound debridement  Chopart amputation  Antimicrobials:  Zosyn  DVT prophylaxis: Lovenox   Objective: Vitals:   12/22/19 1914 12/23/19 0425 12/23/19 0732 12/23/19 1314  BP: 130/86 (!) 144/82 132/69 115/77  Pulse: 89 78 79 86  Resp:   18 18  Temp: 98.9 F (37.2 C) 98.1 F (36.7 C) 97.9 F (36.6 C) 97.7 F (36.5 C)  TempSrc: Oral Oral Oral Oral  SpO2: 98% 96% 100% 99%  Weight:      Height:        Intake/Output Summary (Last 24 hours) at 12/23/2019 1412 Last data filed at  12/23/2019 [id number] Gross per 24 hour  Intake 240 ml  Output 2000 ml  Net -1760 ml   Filed Weights   12/17/19 2210 12/18/19 0000 12/18/19 1428  Weight: 122.5 kg 122.5 kg 122.5 kg    Exam:  General: NAD   Cardiovascular: S1, S2 present  Respiratory: CTAB  Abdomen: Soft, nontender, nondistended, bowel sounds present  Musculoskeletal: No bilateral pedal edema noted, L foot dressing C/D/I  Skin: Normal  Psychiatry: Normal mood    Data Reviewed: CBC: Recent Labs  Lab 12/19/19 0136 12/20/19 0452 12/21/19 0354 12/22/19 0403 12/23/19 0445  WBC 7.2 5.3 5.3 6.9 6.8  NEUTROABS 5.2 2.9 2.8 4.7 4.4  HGB 9.5* 9.4* 9.7* 9.1* 8.6*  HCT 29.9* 29.2* 29.7* 28.1* 27.0*  MCV 91.4 91.5 90.0 90.6 92.2  PLT 184 186 200 208 [id number]   Basic Metabolic Panel: Recent Labs  Lab 12/19/19 0136 12/20/19 0452 12/21/19 0354 12/22/19 0403 12/23/19 0445  NA 138 138 139 137 135  K 3.7 3.9 4.6 4.0 3.9  CL 103 103 102 100 99  CO2 25 26 27 26 26   GLUCOSE 136* 123* 142* 175* 188*  BUN 11 9 13 10 12   CREATININE 0.90 1.04 1.01 0.97 1.13  CALCIUM 9.0 9.0 9.3 8.9 9.1   GFR: Estimated Creatinine Clearance: 103.9 mL/min (by C-G formula based on SCr of 1.13 mg/dL). Liver Function Tests: Recent Labs  Lab 12/17/19 1822  AST 17  ALT 11  ALKPHOS 102  BILITOT 0.9  PROT 7.5  ALBUMIN 3.5   No results for input(s): LIPASE, AMYLASE in the last 168 hours. No results for input(s): AMMONIA in the last 168 hours. Coagulation Profile: No results for input(s): INR, PROTIME in the last 168 hours. Cardiac Enzymes: No results for input(s): CKTOTAL, CKMB, CKMBINDEX, TROPONINI in the last 168 hours. BNP (last 3 results) No results for input(s): PROBNP in the last 8760 hours. HbA1C: No results for input(s): HGBA1C in the last 72 hours. CBG: Recent Labs  Lab 12/22/19 1136 12/22/19 1631 12/22/19 2121 12/23/19 0649 12/23/19 1148  GLUCAP 137* 186* 222* 152* 178*   Lipid Profile: No results for input(s):  CHOL, HDL, LDLCALC, TRIG, CHOLHDL, LDLDIRECT in the last 72 hours. Thyroid Function Tests: No results for input(s): TSH, T4TOTAL, FREET4, T3FREE, THYROIDAB in the last 72 hours. Anemia Panel: No results for input(s): VITAMINB12, FOLATE, FERRITIN, TIBC, IRON, RETICCTPCT in the last 72 hours. Urine analysis:    Component Value Date/Time   COLORURINE YELLOW 08/14/2019 [place] 08/14/2019 1232   LABSPEC 1.020 08/14/2019 1232   PHURINE 5.0 08/14/2019 1232   GLUCOSEU NEGATIVE 08/14/2019 1232   HGBUR NEGATIVE 08/14/2019 [place] 08/14/2019 1232   KETONESUR NEGATIVE 08/14/2019 1232   PROTEINUR NEGATIVE 08/14/2019 1232   UROBILINOGEN 4.0 (H) 10/09/2008 1114   NITRITE NEGATIVE 08/14/2019 1232   LEUKOCYTESUR NEGATIVE 08/14/2019 1232   Sepsis Labs: @LABRCNTIP (procalcitonin:4,lacticidven:4)  ) Recent Results (from the past 240 hour(s))  Culture, blood (routine x 2)     Status: None   Collection Time: 12/17/19  6:22 PM   Specimen: BLOOD RIGHT FOREARM  Result Value Ref Range Status   Specimen Description BLOOD RIGHT FOREARM  Final   Special Requests   Final    BOTTLES DRAWN AEROBIC ONLY Blood Culture adequate volume  Culture   Final    NO GROWTH 5 DAYS Performed at [facility], [address] [place], [place] [address]    Report Status 12/22/2019 FINAL  Final  Culture, blood (routine x 2)     Status: None   Collection Time: 12/17/19  6:22 PM   Specimen: Right Antecubital; Blood  Result Value Ref Range Status   Specimen Description RIGHT ANTECUBITAL  Final   Special Requests   Final    BOTTLES DRAWN AEROBIC AND ANAEROBIC Blood Culture adequate volume   Culture   Final    NO GROWTH 5 DAYS Performed at [facility], [address] [place], [place] [address]    Report Status 12/22/2019 FINAL  Final  Respiratory Panel by RT PCR (Flu A&B, Covid) - Nasopharyngeal Swab     Status: None   Collection Time: 12/17/19  7:42 PM   Specimen: Nasopharyngeal  Swab  Result Value Ref Range Status   SARS Coronavirus 2 by RT PCR NEGATIVE NEGATIVE Final    Comment: (NOTE) SARS-CoV-2 target nucleic acids are NOT DETECTED. The SARS-CoV-2 RNA is generally detectable in upper respiratoy specimens during the acute phase of infection. The lowest concentration of SARS-CoV-2 viral copies this assay can detect is 131 copies/mL. A negative result does not preclude SARS-Cov-2 infection and should not be used as the sole basis for treatment or other patient management decisions. A negative result may occur with  improper specimen collection/handling, submission of specimen other than nasopharyngeal swab, presence of viral mutation(s) within the areas targeted by this assay, and inadequate number of viral copies (<131 copies/mL). A negative result must be combined with clinical observations, patient history, and epidemiological information. The expected result is Negative. Fact Sheet for Patients:  PinkCheek.be Fact Sheet for Healthcare Providers:  GravelBags.it This test is not yet ap proved or cleared by the [place] FDA and  has been authorized for detection and/or diagnosis of SARS-CoV-2 by FDA under an Emergency Use Authorization (EUA). This EUA will remain  in effect (meaning this test can be used) for the duration of the COVID-19 declaration under Section 564(b)(1) of the Act, 21 U.S.C. section 360bbb-3(b)(1), unless the authorization is terminated or revoked sooner.    Influenza A by PCR NEGATIVE NEGATIVE Final   Influenza B by PCR NEGATIVE NEGATIVE Final    Comment: (NOTE) The Xpert Xpress SARS-CoV-2/FLU/RSV assay is intended as an aid in  the diagnosis of influenza from Nasopharyngeal swab specimens and  should not be used as a sole basis for treatment. Nasal washings and  aspirates are unacceptable for Xpert Xpress SARS-CoV-2/FLU/RSV  testing. Fact Sheet for  Patients: PinkCheek.be Fact Sheet for Healthcare Providers: GravelBags.it This test is not yet approved or cleared by the [place] FDA and  has been authorized for detection and/or diagnosis of SARS-CoV-2 by  FDA under an Emergency Use Authorization (EUA). This EUA will remain  in effect (meaning this test can be used) for the duration of the  Covid-19 declaration under Section 564(b)(1) of the Act, 21  U.S.C. section 360bbb-3(b)(1), unless the authorization is  terminated or revoked. Performed at [facility], [address] [place], [place] [address]   Aerobic/Anaerobic Culture (surgical/deep wound)     Status: None   Collection Time: 12/18/19  2:56 PM   Specimen: PATH Other; Tissue  Result Value Ref Range Status   Specimen Description WOUND LEFT FOOT  Final   Special Requests PT ON ZOSYN VANC  Final   Gram Stain   Final  RARE WBC PRESENT, PREDOMINANTLY PMN ABUNDANT GRAM POSITIVE COCCI ABUNDANT GRAM NEGATIVE RODS    Culture   Final    FEW ENTEROBACTER CLOACAE RARE STAPHYLOCOCCUS AUREUS FEW ENTEROCOCCUS FAECALIS FEW BACTEROIDES SPECIES BETA LACTAMASE NEGATIVE Performed at [facility], [address], [place], [place] [address]    Report Status 12/22/2019 FINAL  Final   Organism ID, Bacteria ENTEROBACTER CLOACAE  Final   Organism ID, Bacteria ENTEROCOCCUS FAECALIS  Final   Organism ID, Bacteria STAPHYLOCOCCUS AUREUS  Final      Susceptibility   Enterobacter cloacae - MIC*    CEFAZOLIN >=64 RESISTANT Resistant     CEFEPIME <=0.12 SENSITIVE Sensitive     CEFTAZIDIME <=1 SENSITIVE Sensitive     CIPROFLOXACIN <=0.25 SENSITIVE Sensitive     GENTAMICIN <=1 SENSITIVE Sensitive     IMIPENEM 1 SENSITIVE Sensitive     TRIMETH/SULFA <=20 SENSITIVE Sensitive     PIP/TAZO <=4 SENSITIVE Sensitive     * FEW ENTEROBACTER CLOACAE   Enterococcus faecalis - MIC*    AMPICILLIN <=2 SENSITIVE Sensitive     VANCOMYCIN 1  SENSITIVE Sensitive     GENTAMICIN SYNERGY SENSITIVE Sensitive     * FEW ENTEROCOCCUS FAECALIS   Staphylococcus aureus - MIC*    CIPROFLOXACIN <=0.5 SENSITIVE Sensitive     ERYTHROMYCIN <=0.25 SENSITIVE Sensitive     GENTAMICIN <=0.5 SENSITIVE Sensitive     OXACILLIN 0.5 SENSITIVE Sensitive     TETRACYCLINE <=1 SENSITIVE Sensitive     VANCOMYCIN 1 SENSITIVE Sensitive     TRIMETH/SULFA <=10 SENSITIVE Sensitive     CLINDAMYCIN <=0.25 SENSITIVE Sensitive     RIFAMPIN <=0.5 SENSITIVE Sensitive     Inducible Clindamycin NEGATIVE Sensitive     * RARE STAPHYLOCOCCUS AUREUS  Aerobic/Anaerobic Culture (surgical/deep wound)     Status: None (Preliminary result)   Collection Time: 12/18/19  3:08 PM   Specimen: Other Source; Tissue  Result Value Ref Range Status   Specimen Description BONE LEFT FOOT  Final   Special Requests PT ON ZOSYN VANC  Final   Gram Stain   Final    RARE WBC PRESENT, PREDOMINANTLY MONONUCLEAR FEW GRAM POSITIVE COCCI    Culture   Final    RARE STAPHYLOCOCCUS AUREUS FEW ENTEROBACTER CLOACAE FEW ENTEROCOCCUS FAECALIS ABUNDANT BACTEROIDES SPECIES BETA LACTAMASE NEGATIVE CULTURE REINCUBATED FOR BETTER GROWTH Performed at [facility], [place] [address], [place], [place] [address]    Report Status PENDING  Incomplete   Organism ID, Bacteria STAPHYLOCOCCUS AUREUS  Final   Organism ID, Bacteria ENTEROBACTER CLOACAE  Final   Organism ID, Bacteria ENTEROCOCCUS FAECALIS  Final      Susceptibility   Enterobacter cloacae - MIC*    CEFAZOLIN >=64 RESISTANT Resistant     CEFEPIME <=0.12 SENSITIVE Sensitive     CEFTAZIDIME <=1 SENSITIVE Sensitive     CIPROFLOXACIN <=0.25 SENSITIVE Sensitive     GENTAMICIN <=1 SENSITIVE Sensitive     IMIPENEM 0.5 SENSITIVE Sensitive     TRIMETH/SULFA <=20 SENSITIVE Sensitive     PIP/TAZO <=4 SENSITIVE Sensitive     * FEW ENTEROBACTER CLOACAE   Enterococcus faecalis - MIC*    AMPICILLIN <=2 SENSITIVE Sensitive     VANCOMYCIN 1 SENSITIVE  Sensitive     GENTAMICIN SYNERGY SENSITIVE Sensitive     * FEW ENTEROCOCCUS FAECALIS   Staphylococcus aureus - MIC*    CIPROFLOXACIN <=0.5 SENSITIVE Sensitive     ERYTHROMYCIN <=0.25 SENSITIVE Sensitive     GENTAMICIN <=0.5 SENSITIVE Sensitive  OXACILLIN 0.5 SENSITIVE Sensitive     TETRACYCLINE <=1 SENSITIVE Sensitive     VANCOMYCIN 1 SENSITIVE Sensitive     TRIMETH/SULFA <=10 SENSITIVE Sensitive     CLINDAMYCIN <=0.25 SENSITIVE Sensitive     RIFAMPIN <=0.5 SENSITIVE Sensitive     Inducible Clindamycin NEGATIVE Sensitive     * RARE STAPHYLOCOCCUS AUREUS      Studies: No results found.  Scheduled Meds: . amitriptyline  100 mg Oral QHS  . atorvastatin  40 mg Oral QPM  . DULoxetine  60 mg Oral Daily  . ferrous sulfate  325 mg Oral Daily  . furosemide  20 mg Oral Daily  . gabapentin  900 mg Oral BID WC   And  . gabapentin  1,200 mg Oral QHS  . glycopyrrolate  1 mg Oral BID  . insulin aspart  0-15 Units Subcutaneous TID WC  . insulin aspart  0-5 Units Subcutaneous QHS  . lisinopril  20 mg Oral Daily  . pantoprazole  40 mg Oral Daily  . primidone  50 mg Oral BID  . vitamin B-12  1,000 mcg Oral Daily    Continuous Infusions: . piperacillin-tazobactam (ZOSYN)  IV 3.375 g (12/23/19 1321)     LOS: 6 days     Alma Friendly, MD [facility]  If 7PM-7AM, please contact night-coverage [email or web address] 12/23/2019, 2:12 PM

## 2019-12-23 NOTE — Progress Notes (Signed)
Rehab Admissions Coordinator Note:  Patient was screened by Cleatrice Burke for appropriateness for an Inpatient Acute Rehab Consult per PT and OT change in recommendations.   At this time, we are recommending Inpatient Rehab consult. I will place order per protocol.  Cleatrice Burke RN MSN 12/23/2019, 4:35 PM  I can be reached at [phone].

## 2019-12-23 NOTE — Consult Note (Addendum)
[facility] Nurse Consult Note: Patient receiving care at [facility] Reason for Consult:Wound vac application to Left foot. Wound type:Closed incision with sutures and staples well approximated, with small amount of bloody drainage.  Mepitel placed over sutures and staples area covered with one black foam and a separate black foam placed anterior to the area for bridging of the track pad.  Immediate seal achieved, foot wrapped with ace bandage.    Plan: Incisional Vac to LLE incision line.  Change dressing Q 5 days or to avoid dressing change on the weekend. Apply Mepitel contact layer before applying black foam each time, bridge track pad to anterior LLE, and cover with Ace wrap.  Monitor the wound area's for worsening of condition such as, Signs/symptoms of infection, Increase in size, development of or worsening of odor, development of pain, or increased pain at the affected locations.  Notify the medical team if any of these develop.  Thank you for the consult.  Discussed plan of care with the patient.  WOC will continue to follow.  Austin Miles, RN, [employer]

## 2019-12-23 NOTE — Progress Notes (Signed)
Notified Dr. Baltazar Najjar d/t elevated BP (176/80). Pt asymptomatic. Will continue to monitor.

## 2019-12-23 NOTE — Progress Notes (Signed)
Physical Therapy Treatment Patient Details Name: Eugene Diaz MRN: [id number] DOB: [birth year] Today's Date: 12/23/2019    History of Present Illness Pt is a 54 y/o male admitted secondary to L foot wound infection s/p L transmetatarsal amputation on 11/13/19. He is now s/p I&D on 12/18/19. Planning for Charcot amputation on 12/21/19 per podiatry note 12/19/19. PMH including but not limited to DM and HTN. To OR 2/20 for for conversion to Chopart amputation, NWB    PT Comments    Continuing work on functional mobility and activity tolerance;  Notable that he can maintain NWB LLE using RW with uncomplicated amb; He tells me he is also good at using crutches; We practiced "hop-step" walking; Noting a few losses of balance with turns and transitions to the recliner;   Worked on L knee propping on the seat of the recliner to see about the possibility of using a knee walker/knee scooter; he tolerated "stance" on L knee well; Left a message at Dr. Eleanora Neighbor office to ask about trialing a knee scooter for mobility;   Noting that the plan is for post-acute rehab -- he is motivated, relatively young, and participates very well; It is worth considering a CIR stay for more intensive therapies to maximize independence and safety with mobility and ADLs -- I believe he has the potential to reach modified independence with a CIR stay   Follow Up Recommendations  CIR     Equipment Recommendations  Other (comment)(will consider knee scooter)    Recommendations for Other Services       Precautions / Restrictions Precautions Precautions: Fall Restrictions LLE Weight Bearing: Non weight bearing    Mobility  Bed Mobility Overal bed mobility: Modified Independent Bed Mobility: Supine to Sit           General bed mobility comments: no physical assistance needed  Transfers Overall transfer level: Needs assistance Equipment used: Rolling walker (2 wheeled) Transfers: Sit to/from Stand Sit to Stand:  Min guard         General transfer comment: min guard for safety   Ambulation/Gait Ambulation/Gait assistance: Min guard;Min assist Gait Distance (Feet): 20 Feet Assistive device: Rolling walker (2 wheeled) Gait Pattern/deviations: (hop-to pattern)     General Gait Details: Good support of self on RW; Notable losses of balance with turns, requiring min assist to steady   Stairs             Wheelchair Mobility    Modified Rankin (Stroke Patients Only)       Balance                                            Cognition Arousal/Alertness: Awake/alert Behavior During Therapy: WFL for tasks assessed/performed Overall Cognitive Status: Within Functional Limits for tasks assessed                                        Exercises      General Comments General comments (skin integrity, edema, etc.): Took time to try supporting himself in standing with L knee propped in the seat of the recliner; With UE support, he was able to "stand" in single limb stance on L knee, picking up his R foot; He would be a good candidate for trying a knee scooter  Pertinent Vitals/Pain Pain Assessment: 0-10 Pain Score: 4  Pain Location: L foot--when in dependent position Pain Descriptors / Indicators: Aching Pain Intervention(s): Premedicated before session    Home Living                      Prior Function            PT Goals (current goals can now be found in the care plan section) Acute Rehab PT Goals Patient Stated Goal: not sure about rehab--family thinks I should go PT Goal Formulation: With patient Time For Goal Achievement: 01/01/20 Potential to Achieve Goals: Good Progress towards PT goals: Progressing toward goals    Frequency    Min 3X/week      PT Plan Discharge plan needs to be updated    Co-evaluation              AM-PAC PT "6 Clicks" Mobility   Outcome Measure  Help needed turning from your back  to your side while in a flat bed without using bedrails?: None Help needed moving from lying on your back to sitting on the side of a flat bed without using bedrails?: None Help needed moving to and from a bed to a chair (including a wheelchair)?: None Help needed standing up from a chair using your arms (e.g., wheelchair or bedside chair)?: A Little Help needed to walk in hospital room?: A Little Help needed climbing 3-5 steps with a railing? : A Lot 6 Click Score: 20    End of Session Equipment Utilized During Treatment: Gait belt Activity Tolerance: Patient tolerated treatment well Patient left: in chair;with call bell/phone within reach;with chair alarm set Nurse Communication: Mobility status PT Visit Diagnosis: Other abnormalities of gait and mobility (R26.89);Muscle weakness (generalized) (M62.81);Pain Pain - Right/Left: Left Pain - part of body: Leg;Ankle and joints of foot     Time: [id number] PT Time Calculation (min) (ACUTE ONLY): 29 min  Charges:  $Gait Training: 8-22 mins $Therapeutic Activity: 8-22 mins                     Roney Marion, PT  Acute Rehabilitation Services Pager [phone] Office [place] 12/23/2019, 4:18 PM

## 2019-12-23 NOTE — Progress Notes (Addendum)
Subjective:  Patient ID: Eugene Diaz, male    DOB: Jul 10, 1966,  MRN: [id number]  Patient seen bedside. Having a little bit of pain but it is controlled with medication Objective:   Vitals:   12/23/19 0425 12/23/19 0732  BP: (!) 144/82 132/69  Pulse: 78 79  Resp:  18  Temp: 98.1 F (36.7 C) 97.9 F (36.6 C)  SpO2: 96% 100%   General AA&O x3. Normal mood and affect.  Vascular Left foot warm and well perfused.   Neurologic Epicritic sensation grossly absent.  Dermatologic Wound healing well, skin flaps warm and viable. Sutures and staples intact. [facility] on and functioning prior to removal. ~50 ccs in cannister  Orthopedic: Chopart amputation L No pain to left calf   Results for orders placed or performed during the hospital encounter of 12/17/19 (from the past 24 hour(s))  Glucose, capillary     Status: Abnormal   Collection Time: 12/22/19 11:36 AM  Result Value Ref Range   Glucose-Capillary 137 (H) 70 - 99 mg/dL  Glucose, capillary     Status: Abnormal   Collection Time: 12/22/19  4:31 PM  Result Value Ref Range   Glucose-Capillary 186 (H) 70 - 99 mg/dL  Glucose, capillary     Status: Abnormal   Collection Time: 12/22/19  9:21 PM  Result Value Ref Range   Glucose-Capillary 222 (H) 70 - 99 mg/dL   Comment 1 Document in Chart   CBC with Differential/Platelet     Status: Abnormal   Collection Time: 12/23/19  4:45 AM  Result Value Ref Range   WBC 6.8 4.0 - 10.5 K/uL   RBC 2.93 (L) 4.22 - 5.81 MIL/uL   Hemoglobin 8.6 (L) 13.0 - 17.0 g/dL   HCT 27.0 (L) 39.0 - 52.0 %   MCV 92.2 80.0 - 100.0 fL   MCH 29.4 26.0 - 34.0 pg   MCHC 31.9 30.0 - 36.0 g/dL   RDW 17.0 (H) 11.5 - 15.5 %   Platelets 180 150 - 400 K/uL   nRBC 0.0 0.0 - 0.2 %   Neutrophils Relative % 63 %   Neutro Abs 4.4 1.7 - 7.7 K/uL   Lymphocytes Relative 22 %   Lymphs Abs 1.5 0.7 - 4.0 K/uL   Monocytes Relative 8 %   Monocytes Absolute 0.5 0.1 - 1.0 K/uL   Eosinophils Relative 5 %   Eosinophils Absolute 0.3  0.0 - 0.5 K/uL   Basophils Relative 1 %   Basophils Absolute 0.0 0.0 - 0.1 K/uL   Immature Granulocytes 1 %   Abs Immature Granulocytes 0.04 0.00 - 0.07 K/uL  Basic metabolic panel     Status: Abnormal   Collection Time: 12/23/19  4:45 AM  Result Value Ref Range   Sodium 135 135 - 145 mmol/L   Potassium 3.9 3.5 - 5.1 mmol/L   Chloride 99 98 - 111 mmol/L   CO2 26 22 - 32 mmol/L   Glucose, Bld 188 (H) 70 - 99 mg/dL   BUN 12 6 - 20 mg/dL   Creatinine, Ser 1.13 0.61 - 1.24 mg/dL   Calcium 9.1 8.9 - 10.3 mg/dL   GFR calc non Af Amer >60 >60 mL/min   GFR calc Af Amer >60 >60 mL/min   Anion gap 10 5 - 15  Glucose, capillary     Status: Abnormal   Collection Time: 12/23/19  6:49 AM  Result Value Ref Range   Glucose-Capillary 152 (H) 70 - 99 mg/dL   Comment 1 Document  in Chart    Results for orders placed or performed during the hospital encounter of 12/17/19  Culture, blood (routine x 2)     Status: None   Collection Time: 12/17/19  6:22 PM   Specimen: BLOOD RIGHT FOREARM  Result Value Ref Range Status   Specimen Description BLOOD RIGHT FOREARM  Final   Special Requests   Final    BOTTLES DRAWN AEROBIC ONLY Blood Culture adequate volume   Culture   Final    NO GROWTH 5 DAYS Performed at [facility], [address], [place], [place] [address]    Report Status 12/22/2019 FINAL  Final  Culture, blood (routine x 2)     Status: None   Collection Time: 12/17/19  6:22 PM   Specimen: Right Antecubital; Blood  Result Value Ref Range Status   Specimen Description RIGHT ANTECUBITAL  Final   Special Requests   Final    BOTTLES DRAWN AEROBIC AND ANAEROBIC Blood Culture adequate volume   Culture   Final    NO GROWTH 5 DAYS Performed at [facility], [address] [place], [place] [address]    Report Status 12/22/2019 FINAL  Final  Respiratory Panel by RT PCR (Flu A&B, Covid) - Nasopharyngeal Swab     Status: None   Collection Time: 12/17/19  7:42 PM   Specimen: Nasopharyngeal Swab    Result Value Ref Range Status   SARS Coronavirus 2 by RT PCR NEGATIVE NEGATIVE Final    Comment: (NOTE) SARS-CoV-2 target nucleic acids are NOT DETECTED. The SARS-CoV-2 RNA is generally detectable in upper respiratoy specimens during the acute phase of infection. The lowest concentration of SARS-CoV-2 viral copies this assay can detect is 131 copies/mL. A negative result does not preclude SARS-Cov-2 infection and should not be used as the sole basis for treatment or other patient management decisions. A negative result may occur with  improper specimen collection/handling, submission of specimen other than nasopharyngeal swab, presence of viral mutation(s) within the areas targeted by this assay, and inadequate number of viral copies (<131 copies/mL). A negative result must be combined with clinical observations, patient history, and epidemiological information. The expected result is Negative. Fact Sheet for Patients:  PinkCheek.be Fact Sheet for Healthcare Providers:  GravelBags.it This test is not yet ap proved or cleared by the [place] FDA and  has been authorized for detection and/or diagnosis of SARS-CoV-2 by FDA under an Emergency Use Authorization (EUA). This EUA will remain  in effect (meaning this test can be used) for the duration of the COVID-19 declaration under Section 564(b)(1) of the Act, 21 U.S.C. section 360bbb-3(b)(1), unless the authorization is terminated or revoked sooner.    Influenza A by PCR NEGATIVE NEGATIVE Final   Influenza B by PCR NEGATIVE NEGATIVE Final    Comment: (NOTE) The Xpert Xpress SARS-CoV-2/FLU/RSV assay is intended as an aid in  the diagnosis of influenza from Nasopharyngeal swab specimens and  should not be used as a sole basis for treatment. Nasal washings and  aspirates are unacceptable for Xpert Xpress SARS-CoV-2/FLU/RSV  testing. Fact Sheet for  Patients: PinkCheek.be Fact Sheet for Healthcare Providers: GravelBags.it This test is not yet approved or cleared by the [place] FDA and  has been authorized for detection and/or diagnosis of SARS-CoV-2 by  FDA under an Emergency Use Authorization (EUA). This EUA will remain  in effect (meaning this test can be used) for the duration of the  Covid-19 declaration under Section 564(b)(1) of the Act, 21  U.S.C.  section 360bbb-3(b)(1), unless the authorization is  terminated or revoked. Performed at [facility], [address], [place], [place] [address]   Aerobic/Anaerobic Culture (surgical/deep wound)     Status: None   Collection Time: 12/18/19  2:56 PM   Specimen: PATH Other; Tissue  Result Value Ref Range Status   Specimen Description WOUND LEFT FOOT  Final   Special Requests PT ON ZOSYN VANC  Final   Gram Stain   Final    RARE WBC PRESENT, PREDOMINANTLY PMN ABUNDANT GRAM POSITIVE COCCI ABUNDANT GRAM NEGATIVE RODS    Culture   Final    FEW ENTEROBACTER CLOACAE RARE STAPHYLOCOCCUS AUREUS FEW ENTEROCOCCUS FAECALIS FEW BACTEROIDES SPECIES BETA LACTAMASE NEGATIVE Performed at [facility], [place] [address] [place], [place] [address]    Report Status 12/22/2019 FINAL  Final   Organism ID, Bacteria ENTEROBACTER CLOACAE  Final   Organism ID, Bacteria ENTEROCOCCUS FAECALIS  Final   Organism ID, Bacteria STAPHYLOCOCCUS AUREUS  Final      Susceptibility   Enterobacter cloacae - MIC*    CEFAZOLIN >=64 RESISTANT Resistant     CEFEPIME <=0.12 SENSITIVE Sensitive     CEFTAZIDIME <=1 SENSITIVE Sensitive     CIPROFLOXACIN <=0.25 SENSITIVE Sensitive     GENTAMICIN <=1 SENSITIVE Sensitive     IMIPENEM 1 SENSITIVE Sensitive     TRIMETH/SULFA <=20 SENSITIVE Sensitive     PIP/TAZO <=4 SENSITIVE Sensitive     * FEW ENTEROBACTER CLOACAE   Enterococcus faecalis - MIC*    AMPICILLIN <=2 SENSITIVE Sensitive     VANCOMYCIN 1  SENSITIVE Sensitive     GENTAMICIN SYNERGY SENSITIVE Sensitive     * FEW ENTEROCOCCUS FAECALIS   Staphylococcus aureus - MIC*    CIPROFLOXACIN <=0.5 SENSITIVE Sensitive     ERYTHROMYCIN <=0.25 SENSITIVE Sensitive     GENTAMICIN <=0.5 SENSITIVE Sensitive     OXACILLIN 0.5 SENSITIVE Sensitive     TETRACYCLINE <=1 SENSITIVE Sensitive     VANCOMYCIN 1 SENSITIVE Sensitive     TRIMETH/SULFA <=10 SENSITIVE Sensitive     CLINDAMYCIN <=0.25 SENSITIVE Sensitive     RIFAMPIN <=0.5 SENSITIVE Sensitive     Inducible Clindamycin NEGATIVE Sensitive     * RARE STAPHYLOCOCCUS AUREUS  Aerobic/Anaerobic Culture (surgical/deep wound)     Status: None (Preliminary result)   Collection Time: 12/18/19  3:08 PM   Specimen: Other Source; Tissue  Result Value Ref Range Status   Specimen Description BONE LEFT FOOT  Final   Special Requests PT ON ZOSYN VANC  Final   Gram Stain   Final    RARE WBC PRESENT, PREDOMINANTLY MONONUCLEAR FEW GRAM POSITIVE COCCI    Culture   Final    RARE STAPHYLOCOCCUS AUREUS FEW ENTEROBACTER CLOACAE FEW ENTEROCOCCUS FAECALIS ABUNDANT BACTEROIDES SPECIES BETA LACTAMASE NEGATIVE Performed at [facility], [place] [address], [place], [place] [address]    Report Status PENDING  Incomplete   Organism ID, Bacteria STAPHYLOCOCCUS AUREUS  Final   Organism ID, Bacteria ENTEROBACTER CLOACAE  Final   Organism ID, Bacteria ENTEROCOCCUS FAECALIS  Final      Susceptibility   Enterobacter cloacae - MIC*    CEFAZOLIN >=64 RESISTANT Resistant     CEFEPIME <=0.12 SENSITIVE Sensitive     CEFTAZIDIME <=1 SENSITIVE Sensitive     CIPROFLOXACIN <=0.25 SENSITIVE Sensitive     GENTAMICIN <=1 SENSITIVE Sensitive     IMIPENEM 0.5 SENSITIVE Sensitive     TRIMETH/SULFA <=20 SENSITIVE Sensitive     PIP/TAZO <=4  SENSITIVE Sensitive     * FEW ENTEROBACTER CLOACAE   Enterococcus faecalis - MIC*    AMPICILLIN <=2 SENSITIVE Sensitive     VANCOMYCIN 1 SENSITIVE Sensitive     GENTAMICIN SYNERGY  SENSITIVE Sensitive     * FEW ENTEROCOCCUS FAECALIS   Staphylococcus aureus - MIC*    CIPROFLOXACIN <=0.5 SENSITIVE Sensitive     ERYTHROMYCIN <=0.25 SENSITIVE Sensitive     GENTAMICIN <=0.5 SENSITIVE Sensitive     OXACILLIN 0.5 SENSITIVE Sensitive     TETRACYCLINE <=1 SENSITIVE Sensitive     VANCOMYCIN 1 SENSITIVE Sensitive     TRIMETH/SULFA <=10 SENSITIVE Sensitive     CLINDAMYCIN <=0.25 SENSITIVE Sensitive     RIFAMPIN <=0.5 SENSITIVE Sensitive     Inducible Clindamycin NEGATIVE Sensitive     * RARE STAPHYLOCOCCUS AUREUS     Assessment & Plan:  Patient was evaluated and treated and all questions answered.  Assessment/Plan: S/p Chopart Amputation Left Foot for Osteomyelitis -Imaging: Studies independently reviewed -Antibiotics: Cultures reviewed. Appreciate ID recommendations for Abx and duration of therapy. Believed surgical cure for osteomyeltis, but would benefit from extended abx due to original extensive ST infection -WB Status: Strict NWB LLE -Wound Care: Reapply wound VAC. Wound Care team consult placed. -Would benefit from d/c to SNF at discharge.  -No further surgical intervention planned at this time.  Evelina Bucy, DPM

## 2019-12-23 NOTE — Telephone Encounter (Signed)
[facility] PT - Holly states overall pt is doing well, and she would like to try a knee scooter and if pt's left leg is okay for knee scooter use.

## 2019-12-24 LAB — BASIC METABOLIC PANEL
Anion gap: 10 (ref 5–15)
BUN: 13 mg/dL (ref 6–20)
CO2: 23 mmol/L (ref 22–32)
Calcium: 8.8 mg/dL — ABNORMAL LOW (ref 8.9–10.3)
Chloride: 97 mmol/L — ABNORMAL LOW (ref 98–111)
Creatinine, Ser: 0.99 mg/dL (ref 0.61–1.24)
GFR calc Af Amer: >60 mL/min (ref >60)
GFR calc non Af Amer: >60 mL/min (ref >60)
Glucose, Bld: 187 mg/dL — ABNORMAL HIGH (ref 70–99)
Potassium: 4.1 mmol/L (ref 3.5–5.1)
Sodium: 130 mmol/L — ABNORMAL LOW (ref 135–145)

## 2019-12-24 LAB — CBC WITH DIFFERENTIAL/PLATELET
Abs Immature Granulocytes: 0.02 10*3/uL (ref 0.00–0.07)
Basophils Absolute: 0.1 10*3/uL (ref 0.0–0.1)
Basophils Relative: 1 %
Eosinophils Absolute: 0.4 10*3/uL (ref 0.0–0.5)
Eosinophils Relative: 7 %
HCT: 26.1 % — ABNORMAL LOW (ref 39.0–52.0)
Hemoglobin: 8.3 g/dL — ABNORMAL LOW (ref 13.0–17.0)
Immature Granulocytes: 0 %
Lymphocytes Relative: 34 %
Lymphs Abs: 1.8 10*3/uL (ref 0.7–4.0)
MCH: 29 pg (ref 26.0–34.0)
MCHC: 31.8 g/dL (ref 30.0–36.0)
MCV: 91.3 fL (ref 80.0–100.0)
Monocytes Absolute: 0.5 10*3/uL (ref 0.1–1.0)
Monocytes Relative: 9 %
Neutro Abs: 2.5 10*3/uL (ref 1.7–7.7)
Neutrophils Relative %: 49 %
Platelets: 176 10*3/uL (ref 150–400)
RBC: 2.86 MIL/uL — ABNORMAL LOW (ref 4.22–5.81)
RDW: 16.6 % — ABNORMAL HIGH (ref 11.5–15.5)
WBC: 5.1 10*3/uL (ref 4.0–10.5)
nRBC: 0 % (ref 0.0–0.2)

## 2019-12-24 LAB — GLUCOSE, CAPILLARY
Glucose-Capillary: 144 mg/dL — ABNORMAL HIGH (ref 70–99)
Glucose-Capillary: 148 mg/dL — ABNORMAL HIGH (ref 70–99)
Glucose-Capillary: 233 mg/dL — ABNORMAL HIGH (ref 70–99)
Glucose-Capillary: 125 mg/dL — ABNORMAL HIGH (ref 70–99)

## 2019-12-24 NOTE — Plan of Care (Signed)

## 2019-12-24 NOTE — Progress Notes (Signed)
Pharmacy Antibiotic Note  Eugene Diaz is a 54 y.o. male admitted on 12/17/2019 with osteomyelitis.  Vancomycin discontinued 12/23/19.  Continues on  Zosyn per pharmacy consult for osteomyelitis and soft tissue infection.  Bone culture grew MSSA, Enterococcus faecalis, Enterobacter cloacae  MD notes patient will need 4 weeks of IV antbiotics per podiatry, [name].   Antibiotics narrowed to Zosyn as TRH discussed with  ID [name] on 12/21/19.   Plan: Continue Zosyn 3.375g IV Q8H, infuse over 4 hrs.  Monitor clinical status, renal function and culture results daily.    Height: 6' 2.02" (188 cm) Weight: 270 lb 1 oz (122.5 kg) IBW/kg (Calculated) : 82.24  Temp (24hrs), Avg:98.1 F (36.7 C), Min:97.5 F (36.4 C), Max:98.6 F (37 C)  Recent Labs  Lab 12/17/19 1822 12/18/19 0820 12/20/19 0452 12/21/19 0354 12/21/19 1432 12/21/19 2117 12/22/19 0403 12/23/19 0445 12/24/19 0314  WBC 5.3   < > 5.3 5.3  --   --  6.9 6.8 5.1  CREATININE 0.92   < > 1.04 1.01  --   --  0.97 1.13 0.99  LATICACIDVEN 1.6  --   --   --   --   --   --   --   --   VANCOTROUGH  --   --   --   --   --  17  --   --   --   VANCOPEAK  --   --   --   --  30  --   --   --   --    < > = values in this interval not displayed.    Estimated Creatinine Clearance: 118.6 mL/min (by C-G formula based on SCr of 0.99 mg/dL).    No Known Allergies  Antimicrobials:  Vanc 2/16>> 2/22 Zosyn 2/16>>    Microbiology:  2/16 BC x2: Negative/final 2/16 Resp PCR: SARS CoV-2 negative; Flu A/B negative 2/17 Foot bone culture - Staph aureus (pan-sens); Enterobacter cloacae (cef-resistant); enterococcus faecalis (pan-sens) 2/17 foot wound culture - enterobacter as above, enterocccous as above   Thank you for allowing pharmacy to be a part of this patient's care.  Nicole Cella, RPh Clinical Pharmacist Please check AMION for all [place] phone numbers After 10:00 PM, call [facility] [phone] 12/24/2019 1:35 PM

## 2019-12-24 NOTE — Progress Notes (Signed)
Occupational Therapy Treatment Patient Details Name: Eugene Diaz MRN: [id number] DOB: 1966-09-30 Today's Date: 12/24/2019    History of present illness Pt is a 55 y/o male admitted secondary to L foot wound infection s/p L transmetatarsal amputation on 11/13/19. He is now s/p I&D on 12/18/19. Planning for Charcot amputation on 12/21/19 per podiatry note 12/19/19. PMH including but not limited to DM and HTN. To OR 2/20 for for conversion to Chopart amputation, NWB   OT comments  Pt eating lunch upon arrival, agreeable to OT session. Pt highly motivated to participate in therapy. Pt apologized for sleeping a lot this date, reports being under a great deal of stress with recent passing of his wife of 57 years in August, two other family members passing away recently, COVID, and now recent amputation. Pt began opening up about recent stressors and became teary eyed. Recommend consult with a chaplain, pt agreeable. Pt required modA to powerup from a lower surface and minA for in room mobility. He required minA for stability during more dynamic level balance activities necessary for grooming at sink level. Pt will continue to benefit from skilled OT services to maximize safety and independence with ADL/IADL and functional mobility. Will continue to follow acutely and progress as tolerated.    Follow Up Recommendations  CIR    Equipment Recommendations  3 in 1 bedside commode    Recommendations for Other Services Other (comment)(chaplain )    Precautions / Restrictions Precautions Precautions: Fall Restrictions Weight Bearing Restrictions: Yes LLE Weight Bearing: Non weight bearing Other Position/Activity Restrictions: OK to bear weight through L knee for knee scooter use per conversation with [name]       Mobility Bed Mobility Overal bed mobility: Modified Independent             General bed mobility comments: no physical assistance needed  Transfers Overall transfer level:  Needs assistance Equipment used: Rolling walker (2 wheeled) Transfers: Sit to/from [facility] Sit to Stand: Mod assist Stand pivot transfers: Min assist       General transfer comment: modA to powerup from lower surface    Balance Overall balance assessment: Needs assistance Sitting-balance support: Feet supported Sitting balance-Leahy Scale: Good     Standing balance support: Single extremity supported;During functional activity Standing balance-Leahy Scale: Poor Standing balance comment: challenged dynamic standing balance reaching outside base of support to simulate reaching for items on countertop and from shelves as precursor to ADL                           ADL either performed or assessed with clinical judgement   ADL Overall ADL's : Needs assistance/impaired     Grooming: Set up;Sitting               Lower Body Dressing: Moderate assistance Lower Body Dressing Details (indicate cue type and reason): modA to access BLE Toilet Transfer: Minimal assistance;RW;Ambulation Toilet Transfer Details (indicate cue type and reason): hopped about 10 feet with good adherence to precautions Toileting- Clothing Manipulation and Hygiene: Moderate assistance;Sit to/from stand Toileting - Clothing Manipulation Details (indicate cue type and reason): modA to powerup from lower surface     Functional mobility during ADLs: Minimal assistance;Rolling walker General ADL Comments: decreased activity tolerance     Vision       Perception     Praxis      Cognition Arousal/Alertness: Awake/alert Behavior During Therapy: WFL for tasks assessed/performed Overall Cognitive  Status: Within Functional Limits for tasks assessed                                 General Comments: Pt reports feeling under a lot of stress recently (wife of 30 years passed away in 2023-07-14 and two other family members passed away recently);educated pt on grieving  process associated with losing a limb, pt became emotional and teary eyed. When prompted to discuss coping stratgies pt stated "I deal with it the best I know how";discussed recommendation and benefit of referral for a chaplain, pt not agreeable initially and requested time to process --later in PT session requested to meet with a chaplain        Exercises     Shoulder Instructions       General Comments Emotional and tearful, talking about loss this past year, how he got to his current situation; He agrees to speak with a chaplain; Discussed with OT, who will place referral    Pertinent Vitals/ Pain       Pain Assessment: Faces Faces Pain Scale: Hurts a little bit Pain Location: L foot--when in dependent position Pain Descriptors / Indicators: Aching Pain Intervention(s): Limited activity within patient's tolerance;Monitored during session  Home Living                                          Prior Functioning/Environment              Frequency  Min 2X/week        Progress Toward Goals  OT Goals(current goals can now be found in the care plan section)  Progress towards OT goals: Progressing toward goals  Acute Rehab OT Goals Patient Stated Goal: to get stronger OT Goal Formulation: With patient Time For Goal Achievement: 01/03/20 Potential to Achieve Goals: Good ADL Goals Pt Will Perform Grooming: with modified independence;sitting;standing Pt Will Perform Lower Body Bathing: with modified independence;with adaptive equipment;sit to/from stand Pt Will Perform Lower Body Dressing: with modified independence;with adaptive equipment;sit to/from stand Pt Will Transfer to Toilet: with modified independence;ambulating;bedside commode Pt Will Perform Toileting - Clothing Manipulation and hygiene: with modified independence;sit to/from stand Pt Will Perform Tub/Shower Transfer: Tub transfer;with modified independence;ambulating;rolling walker;tub bench   Plan Discharge plan remains appropriate    Co-evaluation                 AM-PAC OT "6 Clicks" Daily Activity     Outcome Measure   Help from another person eating meals?: None Help from another person taking care of personal grooming?: A Little Help from another person toileting, which includes using toliet, bedpan, or urinal?: A Little Help from another person bathing (including washing, rinsing, drying)?: A Little Help from another person to put on and taking off regular upper body clothing?: A Little Help from another person to put on and taking off regular lower body clothing?: A Lot 6 Click Score: 18    End of Session Equipment Utilized During Treatment: Gait belt;Rolling walker  OT Visit Diagnosis: Unsteadiness on feet (R26.81);Other abnormalities of gait and mobility (R26.89);Pain Pain - Right/Left: Left Pain - part of body: Ankle and joints of foot   Activity Tolerance Patient tolerated treatment well   Patient Left in chair;with call bell/phone within reach;with chair alarm set   Nurse Communication Mobility status  [id number] OT Time Calculation (min): 31 min  Charges: OT General Charges $OT Visit: 1 Visit OT Treatments $Self Care/Home Management : 23-37 mins  Dorinda Hill OTR/L Acute Rehabilitation Services Office: [place] 12/24/2019, 3:54 PM

## 2019-12-24 NOTE — Telephone Encounter (Signed)
Spoke to [place] via secure chat

## 2019-12-24 NOTE — Progress Notes (Signed)
PROGRESS NOTE  Eugene Diaz [id number] DOB: February 26, 1966 DOA: 12/17/2019 PCP: [name], PA-C  HPI/Recap of past 24 hours: HPI from [name] is a 54 y.o. male with medical history significant for DM, GERD, peripheral neuropathy, left wound requiring midfoot Lisfranc amputation with [name] on November 13, 2019 presents to the emergency department with concern of wound infection.  Patient has a wound nurse who sees patient on Monday/Wednesday/Friday every other week for wound dressing changes. Pt noted to have foul-smelling draining discharge with redness extending up the leg, asked to go to the ED for further evaluation. In the ED, BP was elevated at 174/78, but other vital signs were stable. Labs showed normocytic anemia and hyperglycemia.  Left foot x-ray showed midfoot amputation with progressive soft tissue swelling and cortical erosion of the cuneiforms and possibly cuboid bones compatible with osteomyelitis and soft tissue infection. Patient's podiatrist ([name]) was consulted by EDP and recommended starting patient with IV vancomycin and Zosyn with plan to admit patient to hospitalist service at [facility]     Today, patient noted to be sleepy, easily arousable. Denies any new complaints.    Assessment/Plan: Principal Problem:   Osteomyelitis of ankle or foot, acute, left (HCC) Active Problems:   Diabetes mellitus without complication (HCC)   Hypertension   Hypercholesterolemia   Diabetic neuropathy (HCC)   GERD (gastroesophageal reflux disease)   Diabetic infection of left foot (HCC)   Anemia, chronic disease   Hyperglycemia   Left foot wound dehiscence and infection/Osteomyelitis s/p debridement on 12/18/19, s/p chopart amputation and application of wound vac on 12/21/19 by Dr [name] Currently afebrile with no leukocytosis BC x2 NGTD X-ray of left foot showed progressive soft tissue swelling and cortical erosion compatible with osteomyelitis  and soft tissue infection Bone culture grew MSSA, Enterococcus faecalis, Enterobacter cloacae Discussed with ID, Dr Tommy Medal on 12/21/19, can narrow down to [facility], [name] on board, discussed with him on 12/23/19, will need 4 weeks of IV antibiotics Continue Zosyn Wound care PT/OT Daily CBC  Chronic normocytic anemia/iron deficiency Likely 2/2 chronic disease Hemoglobin around baseline Anemia panel showed iron 49, sats 15, ferritin 91, folate 14.7, vitamin B12 84 Continue oral iron supplementation, start vitamin B12 supplementation Daily CBC  Diabetes mellitus type 2/neuropathy Last A1c 6.4 on 11/10/2019 SSI, Accu-Cheks, hypoglycemic protocol Continue gabapentin Hold home glimepiride  Hypertension Continue lisinopril, Lasix per home regimen  Hyperlipidemia Continue statins  GERD Continue Protonix  Anxiety/depression/grieving Denies any SI/HI Continue Xanax, Cymbalta, hydroxyzine  Obesity Lifestyle modification advised       Malnutrition Type:      Malnutrition Characteristics:      Nutrition Interventions:       Estimated body mass index is 34.66 kg/m as calculated from the following:   Height as of this encounter: 6' 2.02" (1.88 m).   Weight as of this encounter: 122.5 kg.     Code Status: Full  Family Communication: None at bedside  Disposition Plan: Likely CIR, awaiting bed   Consultants:  Podiatry, [name]  Procedures:  Wound debridement  Chopart amputation  Antimicrobials:  Zosyn  DVT prophylaxis: Lovenox   Objective: Vitals:   12/23/19 1314 12/23/19 2040 12/24/19 0352 12/24/19 0735  BP: 115/77 (!) 176/80 111/64 135/64  Pulse: 86 88 78 82  Resp: 18 16 18 18   Temp: 97.7 F (36.5 C) 98.6 F (37 C) (!) 97.5 F (36.4 C) 98.6 F (37 C)  TempSrc: Oral Oral  Oral Oral  SpO2: 99% 100% 98% 100%  Weight:      Height:        Intake/Output Summary (Last 24 hours) at 12/24/2019 1307 Last data filed at 12/24/2019  1306 Gross per 24 hour  Intake 853.5 ml  Output 5400 ml  Net -4546.5 ml   Filed Weights   12/17/19 2210 12/18/19 0000 12/18/19 1428  Weight: 122.5 kg 122.5 kg 122.5 kg    Exam:  General: NAD   Cardiovascular: S1, S2 present  Respiratory: CTAB  Abdomen: Soft, nontender, nondistended, bowel sounds present  Musculoskeletal: No bilateral pedal edema noted, L foot dressing C/D/I  Skin: Normal  Psychiatry: Normal mood    Data Reviewed: CBC: Recent Labs  Lab 12/20/19 0452 12/21/19 0354 12/22/19 0403 12/23/19 0445 12/24/19 0314  WBC 5.3 5.3 6.9 6.8 5.1  NEUTROABS 2.9 2.8 4.7 4.4 2.5  HGB 9.4* 9.7* 9.1* 8.6* 8.3*  HCT 29.2* 29.7* 28.1* 27.0* 26.1*  MCV 91.5 90.0 90.6 92.2 91.3  PLT 186 200 208 180 0000000   Basic Metabolic Panel: Recent Labs  Lab 12/20/19 0452 12/21/19 0354 12/22/19 0403 12/23/19 0445 12/24/19 0314  NA 138 139 137 135 130*  K 3.9 4.6 4.0 3.9 4.1  CL 103 102 100 99 97*  CO2 26 27 26 26 23   GLUCOSE 123* 142* 175* 188* 187*  BUN 9 13 10 12 13   CREATININE 1.04 1.01 0.97 1.13 0.99  CALCIUM 9.0 9.3 8.9 9.1 8.8*   GFR: Estimated Creatinine Clearance: 118.6 mL/min (by C-G formula based on SCr of 0.99 mg/dL). Liver Function Tests: Recent Labs  Lab 12/17/19 1822  AST 17  ALT 11  ALKPHOS 102  BILITOT 0.9  PROT 7.5  ALBUMIN 3.5   No results for input(s): LIPASE, AMYLASE in the last 168 hours. No results for input(s): AMMONIA in the last 168 hours. Coagulation Profile: No results for input(s): INR, PROTIME in the last 168 hours. Cardiac Enzymes: No results for input(s): CKTOTAL, CKMB, CKMBINDEX, TROPONINI in the last 168 hours. BNP (last 3 results) No results for input(s): PROBNP in the last 8760 hours. HbA1C: No results for input(s): HGBA1C in the last 72 hours. CBG: Recent Labs  Lab 12/23/19 1148 12/23/19 1631 12/23/19 2042 12/24/19 0633 12/24/19 1117  GLUCAP 178* 211* 210* 144* 148*   Lipid Profile: No results for input(s):  CHOL, HDL, LDLCALC, TRIG, CHOLHDL, LDLDIRECT in the last 72 hours. Thyroid Function Tests: No results for input(s): TSH, T4TOTAL, FREET4, T3FREE, THYROIDAB in the last 72 hours. Anemia Panel: No results for input(s): VITAMINB12, FOLATE, FERRITIN, TIBC, IRON, RETICCTPCT in the last 72 hours. Urine analysis:    Component Value Date/Time   COLORURINE YELLOW 08/14/2019 [place] 08/14/2019 1232   LABSPEC 1.020 08/14/2019 1232   PHURINE 5.0 08/14/2019 1232   GLUCOSEU NEGATIVE 08/14/2019 1232   HGBUR NEGATIVE 08/14/2019 [place] 08/14/2019 1232   KETONESUR NEGATIVE 08/14/2019 1232   PROTEINUR NEGATIVE 08/14/2019 1232   UROBILINOGEN 4.0 (H) 10/09/2008 1114   NITRITE NEGATIVE 08/14/2019 1232   LEUKOCYTESUR NEGATIVE 08/14/2019 1232   Sepsis Labs: @LABRCNTIP (procalcitonin:4,lacticidven:4)  ) Recent Results (from the past 240 hour(s))  Culture, blood (routine x 2)     Status: None   Collection Time: 12/17/19  6:22 PM   Specimen: BLOOD RIGHT FOREARM  Result Value Ref Range Status   Specimen Description BLOOD RIGHT FOREARM  Final   Special Requests   Final    BOTTLES DRAWN AEROBIC ONLY Blood  Culture adequate volume   Culture   Final    NO GROWTH 5 DAYS Performed at [facility], [address] [place], [place] [address]    Report Status 12/22/2019 FINAL  Final  Culture, blood (routine x 2)     Status: None   Collection Time: 12/17/19  6:22 PM   Specimen: Right Antecubital; Blood  Result Value Ref Range Status   Specimen Description RIGHT ANTECUBITAL  Final   Special Requests   Final    BOTTLES DRAWN AEROBIC AND ANAEROBIC Blood Culture adequate volume   Culture   Final    NO GROWTH 5 DAYS Performed at [facility], [address] [place], [place] [address]    Report Status 12/22/2019 FINAL  Final  Respiratory Panel by RT PCR (Flu A&B, Covid) - Nasopharyngeal Swab     Status: None   Collection Time: 12/17/19  7:42 PM   Specimen: Nasopharyngeal  Swab  Result Value Ref Range Status   SARS Coronavirus 2 by RT PCR NEGATIVE NEGATIVE Final    Comment: (NOTE) SARS-CoV-2 target nucleic acids are NOT DETECTED. The SARS-CoV-2 RNA is generally detectable in upper respiratoy specimens during the acute phase of infection. The lowest concentration of SARS-CoV-2 viral copies this assay can detect is 131 copies/mL. A negative result does not preclude SARS-Cov-2 infection and should not be used as the sole basis for treatment or other patient management decisions. A negative result may occur with  improper specimen collection/handling, submission of specimen other than nasopharyngeal swab, presence of viral mutation(s) within the areas targeted by this assay, and inadequate number of viral copies (<131 copies/mL). A negative result must be combined with clinical observations, patient history, and epidemiological information. The expected result is Negative. Fact Sheet for Patients:  PinkCheek.be Fact Sheet for Healthcare Providers:  GravelBags.it This test is not yet ap proved or cleared by the [place] FDA and  has been authorized for detection and/or diagnosis of SARS-CoV-2 by FDA under an Emergency Use Authorization (EUA). This EUA will remain  in effect (meaning this test can be used) for the duration of the COVID-19 declaration under Section 564(b)(1) of the Act, 21 U.S.C. section 360bbb-3(b)(1), unless the authorization is terminated or revoked sooner.    Influenza A by PCR NEGATIVE NEGATIVE Final   Influenza B by PCR NEGATIVE NEGATIVE Final    Comment: (NOTE) The Xpert Xpress SARS-CoV-2/FLU/RSV assay is intended as an aid in  the diagnosis of influenza from Nasopharyngeal swab specimens and  should not be used as a sole basis for treatment. Nasal washings and  aspirates are unacceptable for Xpert Xpress SARS-CoV-2/FLU/RSV  testing. Fact Sheet for  Patients: PinkCheek.be Fact Sheet for Healthcare Providers: GravelBags.it This test is not yet approved or cleared by the [place] FDA and  has been authorized for detection and/or diagnosis of SARS-CoV-2 by  FDA under an Emergency Use Authorization (EUA). This EUA will remain  in effect (meaning this test can be used) for the duration of the  Covid-19 declaration under Section 564(b)(1) of the Act, 21  U.S.C. section 360bbb-3(b)(1), unless the authorization is  terminated or revoked. Performed at  [facility], [address], [place], [place] [address]   Aerobic/Anaerobic Culture (surgical/deep wound)     Status: None   Collection Time: 12/18/19  2:56 PM   Specimen: PATH Other; Tissue  Result Value Ref Range Status   Specimen Description WOUND LEFT FOOT  Final   Special Requests PT ON [facility]  Final  Gram Stain   Final    RARE WBC PRESENT, PREDOMINANTLY PMN ABUNDANT GRAM POSITIVE COCCI ABUNDANT GRAM NEGATIVE RODS    Culture   Final    FEW ENTEROBACTER CLOACAE RARE STAPHYLOCOCCUS AUREUS FEW ENTEROCOCCUS FAECALIS FEW BACTEROIDES SPECIES BETA LACTAMASE NEGATIVE Performed at [facility], [address] [place], [place] [address]    Report Status 12/22/2019 FINAL  Final   Organism ID, Bacteria ENTEROBACTER CLOACAE  Final   Organism ID, Bacteria ENTEROCOCCUS FAECALIS  Final   Organism ID, Bacteria STAPHYLOCOCCUS AUREUS  Final      Susceptibility   Enterobacter cloacae - MIC*    CEFAZOLIN >=64 RESISTANT Resistant     CEFEPIME <=0.12 SENSITIVE Sensitive     CEFTAZIDIME <=1 SENSITIVE Sensitive     CIPROFLOXACIN <=0.25 SENSITIVE Sensitive     GENTAMICIN <=1 SENSITIVE Sensitive     IMIPENEM 1 SENSITIVE Sensitive     TRIMETH/SULFA <=20 SENSITIVE Sensitive     PIP/TAZO <=4 SENSITIVE Sensitive     * FEW ENTEROBACTER CLOACAE   Enterococcus faecalis - MIC*    AMPICILLIN <=2 SENSITIVE Sensitive     VANCOMYCIN 1  SENSITIVE Sensitive     GENTAMICIN SYNERGY SENSITIVE Sensitive     * FEW ENTEROCOCCUS FAECALIS   Staphylococcus aureus - MIC*    CIPROFLOXACIN <=0.5 SENSITIVE Sensitive     ERYTHROMYCIN <=0.25 SENSITIVE Sensitive     GENTAMICIN <=0.5 SENSITIVE Sensitive     OXACILLIN 0.5 SENSITIVE Sensitive     TETRACYCLINE <=1 SENSITIVE Sensitive     VANCOMYCIN 1 SENSITIVE Sensitive     TRIMETH/SULFA <=10 SENSITIVE Sensitive     CLINDAMYCIN <=0.25 SENSITIVE Sensitive     RIFAMPIN <=0.5 SENSITIVE Sensitive     Inducible Clindamycin NEGATIVE Sensitive     * RARE STAPHYLOCOCCUS AUREUS  Aerobic/Anaerobic Culture (surgical/deep wound)     Status: None   Collection Time: 12/18/19  3:08 PM   Specimen: Other Source; Tissue  Result Value Ref Range Status   Specimen Description BONE LEFT FOOT  Final   Special Requests PT ON ZOSYN VANC  Final   Gram Stain   Final    RARE WBC PRESENT, PREDOMINANTLY MONONUCLEAR FEW GRAM POSITIVE COCCI    Culture   Final    RARE STAPHYLOCOCCUS AUREUS FEW ENTEROBACTER CLOACAE FEW ENTEROCOCCUS FAECALIS ABUNDANT BACTEROIDES SPECIES BETA LACTAMASE NEGATIVE FEW DIPHTHEROIDS(CORYNEBACTERIUM SPECIES) ORGANISM 6 Standardized susceptibility testing for this organism is not available.    Report Status 12/23/2019 FINAL  Final   Organism ID, Bacteria STAPHYLOCOCCUS AUREUS  Final   Organism ID, Bacteria ENTEROBACTER CLOACAE  Final   Organism ID, Bacteria ENTEROCOCCUS FAECALIS  Final      Susceptibility   Enterobacter cloacae - MIC*    CEFAZOLIN >=64 RESISTANT Resistant     CEFEPIME <=0.12 SENSITIVE Sensitive     CEFTAZIDIME <=1 SENSITIVE Sensitive     CIPROFLOXACIN <=0.25 SENSITIVE Sensitive     GENTAMICIN <=1 SENSITIVE Sensitive     IMIPENEM 0.5 SENSITIVE Sensitive     TRIMETH/SULFA <=20 SENSITIVE Sensitive     PIP/TAZO <=4 SENSITIVE Sensitive     * FEW ENTEROBACTER CLOACAE   Enterococcus faecalis - MIC*    AMPICILLIN <=2 SENSITIVE Sensitive     VANCOMYCIN 1 SENSITIVE  Sensitive     GENTAMICIN SYNERGY SENSITIVE Sensitive     * FEW ENTEROCOCCUS FAECALIS   Staphylococcus aureus - MIC*    CIPROFLOXACIN <=0.5 SENSITIVE Sensitive     ERYTHROMYCIN <=0.25 SENSITIVE Sensitive     GENTAMICIN <=  0.5 SENSITIVE Sensitive     OXACILLIN 0.5 SENSITIVE Sensitive     TETRACYCLINE <=1 SENSITIVE Sensitive     VANCOMYCIN 1 SENSITIVE Sensitive     TRIMETH/SULFA <=10 SENSITIVE Sensitive     CLINDAMYCIN <=0.25 SENSITIVE Sensitive     RIFAMPIN <=0.5 SENSITIVE Sensitive     Inducible Clindamycin NEGATIVE Sensitive     * RARE STAPHYLOCOCCUS AUREUS      Studies: No results found.  Scheduled Meds: . amitriptyline  100 mg Oral QHS  . atorvastatin  40 mg Oral QPM  . DULoxetine  60 mg Oral Daily  . enoxaparin (LOVENOX) injection  40 mg Subcutaneous Q24H  . ferrous sulfate  325 mg Oral Daily  . furosemide  20 mg Oral Daily  . gabapentin  900 mg Oral BID WC   And  . gabapentin  1,200 mg Oral QHS  . glycopyrrolate  1 mg Oral BID  . insulin aspart  0-15 Units Subcutaneous TID WC  . insulin aspart  0-5 Units Subcutaneous QHS  . lisinopril  20 mg Oral Daily  . pantoprazole  40 mg Oral Daily  . primidone  50 mg Oral BID  . vitamin B-12  1,000 mcg Oral Daily    Continuous Infusions: . piperacillin-tazobactam (ZOSYN)  IV 3.375 g (12/24/19 1304)     LOS: 7 days     Alma Friendly, MD [facility]  If 7PM-7AM, please contact night-coverage [email or web address] 12/24/2019, 1:07 PM

## 2019-12-24 NOTE — Plan of Care (Signed)

## 2019-12-24 NOTE — Progress Notes (Signed)
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 He is now s/p I&D on 12/18/19. Planning for Charcot amputation on 12/21/19 per podiatry note 12/19/19. PMH including but not limited to DM and HTN. To OR 2/20 for for conversion to Chopart amputation, NWB    PT Comments    Continuing work on functional mobility and activity tolerance;  First time trying a knee scooter went overall well; He needs more practice with transfers to it, and close guard to keep NWB LLE; Continue to recommend comprehensive inpatient rehab (CIR) for post-acute therapy needs.   Worth considering a Chiropractor, and Neuropsych Consult at [facility] -- expressed a lot of loss/grief today during session.   Follow Up Recommendations  CIR     Equipment Recommendations  Other (comment)(will consider knee scooter)    Recommendations for Other Services       Precautions / Restrictions Precautions Precautions: Fall Restrictions LLE Weight Bearing: Non weight bearing Other Position/Activity Restrictions: OK to bear weight through L knee for knee scooter use per conversation with [name]    Mobility  Bed Mobility Overal bed mobility: Modified Independent             General bed mobility comments: no physical assistance needed  Transfers Overall transfer level: Needs assistance Equipment used: (knee scooter) Transfers: Sit to/from Stand;Stand Pivot Transfers Sit to Stand: Min guard Stand pivot transfers: Min assist       General transfer comment: Min guard for safety; Min assist to steady with standing to knee scooter, occasionally touching down L foot, worrisome for putting weight on it  Ambulation/Gait Ambulation/Gait assistance: Min assist Gait Distance (Feet): (Hallway amb with knee  scooter) Assistive device: (Knee walker/scooter)       General Gait Details: Close guard and occasional min assist to stedy; Demonstational cues for technique; cues to move slowly; overall first time in knee scooter went well   Stairs             Wheelchair Mobility    Modified Rankin (Stroke Patients Only)       Balance     Sitting balance-Leahy Scale: Good       Standing balance-Leahy Scale: Poor                              Cognition Arousal/Alertness: Awake/alert Behavior During Therapy: WFL for tasks assessed/performed Overall Cognitive Status: Within Functional Limits for tasks assessed                                 General Comments: Became emotional during conversation about functional status today      Exercises      General Comments General comments (skin integrity, edema, etc.): Emotional and tearful, talking about loss this past year, how he got to his current situation; He agrees to speak with a chaplain; Discussed with OT, who will place referral      Pertinent Vitals/Pain Pain Assessment: Faces Faces Pain Scale: Hurts a little bit Pain Location: L foot--when in dependent position Pain Descriptors / Indicators: Aching Pain Intervention(s): Monitored during session    Home Living  Prior Function            PT Goals (current goals can now be found in the care plan section) Acute Rehab PT Goals Patient Stated Goal: not sure about rehab--family thinks I should go PT Goal Formulation: With patient Time For Goal Achievement: 01/01/20 Potential to Achieve Goals: Good Progress towards PT goals: Progressing toward goals    Frequency    Min 3X/week      PT Plan Current plan remains appropriate    Co-evaluation              AM-PAC PT "6 Clicks" Mobility   Outcome Measure  Help needed turning from your back to your side while in a flat bed without using bedrails?:  None Help needed moving from lying on your back to sitting on the side of a flat bed without using bedrails?: None Help needed moving to and from a bed to a chair (including a wheelchair)?: None Help needed standing up from a chair using your arms (e.g., wheelchair or bedside chair)?: A Little Help needed to walk in hospital room?: A Little Help needed climbing 3-5 steps with a railing? : A Lot 6 Click Score: 20    End of Session Equipment Utilized During Treatment: Gait belt Activity Tolerance: Patient tolerated treatment well Patient left: in chair;with call bell/phone within reach Nurse Communication: Mobility status PT Visit Diagnosis: Other abnormalities of gait and mobility (R26.89);Muscle weakness (generalized) (M62.81);Pain Pain - Right/Left: Left Pain - part of body: Leg;Ankle and joints of foot     Time: 1335-1405 PT Time Calculation (min) (ACUTE ONLY): 30 min  Charges:  $Gait Training: 23-37 mins                     Roney Marion, [place]  Acute Rehabilitation Services Pager [phone] Office [place] 12/24/2019, 3:22 PM

## 2019-12-24 NOTE — Progress Notes (Addendum)
Inpatient Rehab Admissions:  Inpatient Rehab Consult received.  I met with pt at the bedside for rehabilitation assessment and to discuss goals and expectations of an inpatient rehab admission. Feel pt is a good candidate for CIR at this time. He states he has family and friends that can drop in as needed and feel he has the potential to reach Mod I. Pt interested in CIR program and would like to proceed once bed becomes available. At this time I do not have a bed available for this patient today. Will follow up tomorrow, pending bed availability.   Raechel Ache, OTR/L  Rehab Admissions Coordinator  [phone] 12/24/2019 2:59 PM  Addendum 4:52PM: Received call back from pt's sister [name]. She has concerns over the patient returning home after 1 week or so of therapy as she feels he has not been taking care of his foot in the past. She believes SNF may be a better option as he could stay in that program longer and it would give his foot a chance to heal. She plans to talk to the patient this evening regarding her recommendation for him. Will follow up with pt tomorrow AM to see what has been decided.   Raechel Ache, OTR/L  Rehab Admissions Coordinator  [phone] 12/24/2019 4:54 PM

## 2019-12-25 LAB — CBC WITH DIFFERENTIAL/PLATELET
Abs Immature Granulocytes: 0.03 10*3/uL (ref 0.00–0.07)
Basophils Absolute: 0 10*3/uL (ref 0.0–0.1)
Basophils Relative: 1 %
Eosinophils Absolute: 0.3 10*3/uL (ref 0.0–0.5)
Eosinophils Relative: 6 %
HCT: 25.1 % — ABNORMAL LOW (ref 39.0–52.0)
Hemoglobin: 8.1 g/dL — ABNORMAL LOW (ref 13.0–17.0)
Immature Granulocytes: 1 %
Lymphocytes Relative: 36 %
Lymphs Abs: 1.6 10*3/uL (ref 0.7–4.0)
MCH: 29.1 pg (ref 26.0–34.0)
MCHC: 32.3 g/dL (ref 30.0–36.0)
MCV: 90.3 fL (ref 80.0–100.0)
Monocytes Absolute: 0.4 10*3/uL (ref 0.1–1.0)
Monocytes Relative: 8 %
Neutro Abs: 2.1 10*3/uL (ref 1.7–7.7)
Neutrophils Relative %: 48 %
Platelets: 182 10*3/uL (ref 150–400)
RBC: 2.78 MIL/uL — ABNORMAL LOW (ref 4.22–5.81)
RDW: 16.3 % — ABNORMAL HIGH (ref 11.5–15.5)
WBC: 4.3 10*3/uL (ref 4.0–10.5)
nRBC: 0 % (ref 0.0–0.2)

## 2019-12-25 LAB — BASIC METABOLIC PANEL
Anion gap: 8 (ref 5–15)
BUN: 14 mg/dL (ref 6–20)
CO2: 26 mmol/L (ref 22–32)
Calcium: 8.9 mg/dL (ref 8.9–10.3)
Chloride: 102 mmol/L (ref 98–111)
Creatinine, Ser: 1.06 mg/dL (ref 0.61–1.24)
GFR calc Af Amer: >60 mL/min (ref >60)
GFR calc non Af Amer: >60 mL/min (ref >60)
Glucose, Bld: 165 mg/dL — ABNORMAL HIGH (ref 70–99)
Potassium: 4.1 mmol/L (ref 3.5–5.1)
Sodium: 136 mmol/L (ref 135–145)

## 2019-12-25 LAB — GLUCOSE, CAPILLARY
Glucose-Capillary: 154 mg/dL — ABNORMAL HIGH (ref 70–99)
Glucose-Capillary: 155 mg/dL — ABNORMAL HIGH (ref 70–99)
Glucose-Capillary: 169 mg/dL — ABNORMAL HIGH (ref 70–99)
Glucose-Capillary: 137 mg/dL — ABNORMAL HIGH (ref 70–99)

## 2019-12-25 LAB — SURGICAL PATHOLOGY

## 2019-12-25 MED ORDER — IBUPROFEN 200 MG PO TABS
400.0000 mg | ORAL_TABLET | Freq: Three times a day (TID) | ORAL | Status: DC
  Administered 2019-12-25 – 2019-12-27 (×7): 400 mg via ORAL
  Filled 2019-12-25 (×6): qty 2

## 2019-12-25 MED ORDER — OXYCODONE HCL 5 MG PO TABS
10.0000 mg | ORAL_TABLET | ORAL | Status: DC | PRN
  Administered 2019-12-25 – 2019-12-27 (×6): 10 mg via ORAL
  Filled 2019-12-25 (×6): qty 2

## 2019-12-25 MED ORDER — MORPHINE SULFATE (PF) 2 MG/ML IV SOLN
1.0000 mg | INTRAVENOUS | Status: DC | PRN
  Administered 2019-12-25 – 2019-12-26 (×2): 1 mg via INTRAVENOUS
  Filled 2019-12-25 (×2): qty 1

## 2019-12-25 MED ORDER — ACETAMINOPHEN 500 MG PO TABS
500.0000 mg | ORAL_TABLET | Freq: Four times a day (QID) | ORAL | Status: DC
  Administered 2019-12-25 – 2019-12-27 (×8): 500 mg via ORAL
  Filled 2019-12-25 (×8): qty 1

## 2019-12-25 NOTE — Progress Notes (Signed)
PROGRESS NOTE    Eugene Diaz  [id number] DOB: 1966-10-02 DOA: 12/17/2019 PCP: [name], PA-C    Brief Narrative:  Patient was admitted to the hospital with left foot osteomyelitis.  54 year old male who presented with left foot wound infection.  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  At home he was noted to have foul-smelling drainage from his left foot wound, associated with erythema and worsening pain.  On his initial physical examination blood pressure 174/78, heart rate 88, temperature 98.3, oxygen saturation 98%.  His lungs are clear to auscultation bilaterally, heart S1-S2 present rhythmic, soft abdomen, left foot wound with purulent drainage, positive erythema and edema.  Left foot x-ray showed mid foot amputation with progressive soft tissue swelling and cortical erosion of the cuneiforms and possibly cuboid bones compatible with osteomyelitis and soft tissue infection.  His cultures had grew MSSA, Enterococcus faecalis, Enterobacter cloacae, ID recommended Zosyn for antibiotic therapy for 4 weeks duration.  Assessment & Plan:   Principal Problem:   Osteomyelitis of ankle or foot, acute, left (HCC) Active Problems:   Diabetes mellitus without complication (HCC)   Hypertension   Hypercholesterolemia   Diabetic neuropathy (HCC)   GERD (gastroesophageal reflux disease)   Diabetic infection of left foot (HCC)   Anemia, chronic disease   Hyperglycemia   1. Left foot wound dehiscence/ osteomyelitis. Sp debridement, 2/17, chopart amputation and application of wound vac 02/20. Patient continue to have pain not controlled. Tolerating well wound care and antibiotic therapy with IV zosyn.   Will add ibuprofen and acetaminophen scheduled, increase oxycodone to 10 mg as needed and resume IV as needed morphine. Will need to complete total of 4  weeks of antibiotic therapy for polymicrobial infection with IV zosyn.   Patient will need SNF.  2, T2DM ( Hgb A1c 6,4)/ dyslipidemia. Will continue glucose cover and monitoring with insulin sliding scale, patient is tolerating po well.  Continue with statin therapy.   3. HTN. Continue blood pressure control with lisinopril.   4. Obesity. Calculated BMI is 34,6.   5. Anxiety and depression. Continue with hydroxyzine, alprazolam, Cymbalta.   DVT prophylaxis: enoxaparin   Code Status:  full Family Communication: no family at the bedside Disposition Plan/ discharge barriers:  Pending SNF placement.    Antimicrobials:   Zosyn     Subjective: Patient continue to have pain at he left foot, moderate to severe in intensity, dull in nature, with no radiation, improved with morphine, worse to touch, no nausea or vomiting, no dyspnea or chest pain.   Objective: Vitals:   12/24/19 1315 12/24/19 1946 12/25/19 0403 12/25/19 0835  BP: (!) 143/73 (!) 152/80 127/70 134/74  Pulse: 86 88 73 77  Resp: 18 18 18 16   Temp: 98.7 F (37.1 C) 98.7 F (37.1 C) 97.7 F (36.5 C) 98.2 F (36.8 C)  TempSrc: Oral Oral Oral Oral  SpO2: 98% 99% 97% 100%  Weight:      Height:        Intake/Output Summary (Last 24 hours) at 12/25/2019 1155 Last data filed at 12/25/2019 0500 Gross per 24 hour  Intake 580 ml  Output 4610 ml  Net -4030 ml   Filed Weights   12/17/19 2210 12/18/19 0000 12/18/19 1428  Weight: 122.5 kg 122.5 kg 122.5 kg    Examination:   General: Not in pain or dyspnea, deconditioned  Neurology: Awake and alert, non  focal  E ENT: mild pallor, no icterus, oral mucosa moist Cardiovascular: No JVD. S1-S2 present, rhythmic, no gallops, rubs, or murmurs. No lower extremity edema. Pulmonary: positive breath sounds bilaterally, adequate air movement, no wheezing, rhonchi or rales. Gastrointestinal. Abdomen with, no organomegaly, non tender, no rebound or guarding Skin. No  rashes Musculoskeletal: left foot with dressing in place/ No frank edema or visible erythema.      Data Reviewed: I have personally reviewed following labs and imaging studies  CBC: Recent Labs  Lab 12/21/19 0354 12/22/19 0403 12/23/19 0445 12/24/19 0314 12/25/19 0255  WBC 5.3 6.9 6.8 5.1 4.3  NEUTROABS 2.8 4.7 4.4 2.5 2.1  HGB 9.7* 9.1* 8.6* 8.3* 8.1*  HCT 29.7* 28.1* 27.0* 26.1* 25.1*  MCV 90.0 90.6 92.2 91.3 90.3  PLT 200 208 180 176 [id number]   Basic Metabolic Panel: Recent Labs  Lab 12/21/19 0354 12/22/19 0403 12/23/19 0445 12/24/19 0314 12/25/19 0255  NA 139 137 135 130* 136  K 4.6 4.0 3.9 4.1 4.1  CL 102 100 99 97* 102  CO2 27 26 26 23 26   GLUCOSE 142* 175* 188* 187* 165*  BUN 13 10 12 13 14   CREATININE 1.01 0.97 1.13 0.99 1.06  CALCIUM 9.3 8.9 9.1 8.8* 8.9   GFR: Estimated Creatinine Clearance: 110.8 mL/min (by C-G formula based on SCr of 1.06 mg/dL). Liver Function Tests: No results for input(s): AST, ALT, ALKPHOS, BILITOT, PROT, ALBUMIN in the last 168 hours. No results for input(s): LIPASE, AMYLASE in the last 168 hours. No results for input(s): AMMONIA in the last 168 hours. Coagulation Profile: No results for input(s): INR, PROTIME in the last 168 hours. Cardiac Enzymes: No results for input(s): CKTOTAL, CKMB, CKMBINDEX, TROPONINI in the last 168 hours. BNP (last 3 results) No results for input(s): PROBNP in the last 8760 hours. HbA1C: No results for input(s): HGBA1C in the last 72 hours. CBG: Recent Labs  Lab 12/24/19 1117 12/24/19 1703 12/24/19 1948 12/25/19 0633 12/25/19 1120  GLUCAP 148* 233* 125* 154* 155*   Lipid Profile: No results for input(s): CHOL, HDL, LDLCALC, TRIG, CHOLHDL, LDLDIRECT in the last 72 hours. Thyroid Function Tests: No results for input(s): TSH, T4TOTAL, FREET4, T3FREE, THYROIDAB in the last 72 hours. Anemia Panel: No results for input(s): VITAMINB12, FOLATE, FERRITIN, TIBC, IRON, RETICCTPCT in the last 72  hours.    Radiology Studies: I have reviewed all of the imaging during this hospital visit personally     Scheduled Meds: . amitriptyline  100 mg Oral QHS  . atorvastatin  40 mg Oral QPM  . DULoxetine  60 mg Oral Daily  . enoxaparin (LOVENOX) injection  40 mg Subcutaneous Q24H  . ferrous sulfate  325 mg Oral Daily  . furosemide  20 mg Oral Daily  . gabapentin  900 mg Oral BID WC   And  . gabapentin  1,200 mg Oral QHS  . glycopyrrolate  1 mg Oral BID  . insulin aspart  0-15 Units Subcutaneous TID WC  . insulin aspart  0-5 Units Subcutaneous QHS  . lisinopril  20 mg Oral Daily  . pantoprazole  40 mg Oral Daily  . primidone  50 mg Oral BID  . vitamin B-12  1,000 mcg Oral Daily   Continuous Infusions: . piperacillin-tazobactam (ZOSYN)  IV 3.375 g (12/25/19 0324)     LOS: 8 days        Shizue Kaseman Gerome Apley, MD

## 2019-12-25 NOTE — Progress Notes (Signed)
Pharmacy Antibiotic Note  Eugene Diaz is a 54 y.o. male admitted on 12/17/2019 with L foot osteo.  Pharmacy has been consulted for Zosyn dosing.  ID: Zosyn D#9 for L foot osteo - Afebrile, WBC WNL, SCr WNL - s/p debridement and partial incision of infected bone 2/17 - Bone culture grew MSSA, Enterococcus faecalis, Enterobacter cloacae - Podiatry following-notes 2/22 believed surgical cure for osteomyeltis, but would benefit from extended abx due to original extensive ST infection - Vanc dc'd 2/22, cont Zosyn  Vanc 2/16>> 2/22 Zosyn 2/16>>    2/16 BC x2: Neg 2/16 Resp PCR: SARS CoV-2 negative; Flu A/B negative 2/17 Foot bone culture - Staph aureus (pan-sens); Enterobacter cloacae (cef-resistant); enterococcus faecalis (pan-sens) 2/17 foot wound culture - enterobacter as above, enterocccous as above   Plan: Continue zosyn 3.375gm IV Q8H (4 hr inf) - will need 4 weeks of IV abx per podiatry, MD d/w ID, Dr Tommy Medal on 12/21/19, can narrow down to [place] will sign off. Please reconsult for further dosing assitance.    Height: 6' 2.02" (188 cm) Weight: 270 lb 1 oz (122.5 kg) IBW/kg (Calculated) : 82.24  Temp (24hrs), Avg:98.3 F (36.8 C), Min:97.7 F (36.5 C), Max:98.7 F (37.1 C)  Recent Labs  Lab 12/21/19 0354 12/21/19 1432 12/21/19 2117 12/22/19 0403 12/23/19 0445 12/24/19 0314 12/25/19 0255  WBC 5.3  --   --  6.9 6.8 5.1 4.3  CREATININE 1.01  --   --  0.97 1.13 0.99 1.06  VANCOTROUGH  --   --  17  --   --   --   --   VANCOPEAK  --  30  --   --   --   --   --     Estimated Creatinine Clearance: 110.8 mL/min (by C-G formula based on SCr of 1.06 mg/dL).    No Known Allergies   Norissa Bartee S. Alford Highland, PharmD, BCPS Clinical Staff Pharmacist [email or web address] [place] 12/25/2019 10:34 AM

## 2019-12-25 NOTE — TOC Initial Note (Signed)
Transition of Care [facility]) - Initial/Assessment Note    Patient Details  Name: Eugene Diaz MRN: [id number] Date of Birth: [birth year]  Transition of Care [facility]) CM/SW Contact:    Atilano Median, LCSW Phone Number: 12/25/2019, 3:00 PM  Clinical Narrative:                 Admitted with medical history significant for type II there is mellitus, GERD, peripheral neuropathy, left wound requiring midfoot Lisfranc amputation with [name] on November 13, 2019 presents to the emergency department with concern of wound infection.  PT initially recommending CIR. However, patient and sister feel that patient will require a longer course of treatment than CIR can provide. Patient/sister now agreeable to SNF.   Patient will require 4 weeks of IV abx.   CSW given permission to send out information for review. Information sent out for review. TOC will continue to follow for dispo planning.   Expected Discharge Plan: Skilled Nursing Facility Barriers to Discharge: Continued Medical Work up, No SNF bed   Patient Goals and CMS Choice Patient states their goals for this hospitalization and ongoing recovery are:: get well enough to return home on my own CMS Medicare.gov Compare Post Acute Care list provided to:: Patient Choice offered to / list presented to : Patient  Expected Discharge Plan and Services Expected Discharge Plan: [place] In-house Referral: Clinical Social Work   Post Acute Care Choice: [place] Living arrangements for the past 2 months: [place]                                      Prior Living Arrangements/Services Living arrangements for the past 2 months: Single Family Home Lives with:: Self   Do you feel safe going back to the place where you live?: No   unable to provide the care at this time  Need for Family Participation in Patient Care: Yes (Comment) Care giver support system in place?: No (comment)       Activities of Daily Living Home Assistive Devices/Equipment: Cane (specify quad or straight) ADL Screening (condition at time of admission) Patient's cognitive ability adequate to safely complete daily activities?: Yes Is the patient deaf or have difficulty hearing?: No Does the patient have difficulty seeing, even when wearing glasses/contacts?: No Does the patient have difficulty concentrating, remembering, or making decisions?: No Patient able to express need for assistance with ADLs?: Yes Does the patient have difficulty dressing or bathing?: No Independently performs ADLs?: Yes (appropriate for developmental age) Does the patient have difficulty walking or climbing stairs?: No Weakness of Legs: Left Weakness of Arms/Hands: None  Permission Sought/Granted Permission sought to share information with : Customer service manager, Family Supports                Emotional Assessment       Orientation: : Oriented to Self, Oriented to Place, Oriented to Situation, Oriented to  Time      Admission diagnosis:  Acute osteomyelitis of left foot ([place]) [M86.172] Left leg cellulitis [L03.116] Osteomyelitis of ankle or foot, acute, left [facility]) [id number] Patient Active Problem List   Diagnosis Date Noted  . Osteomyelitis of ankle or foot, acute, left ([place]) 12/17/2019  . Hyperglycemia 12/17/2019  . Diabetic ulcer of left midfoot associated with type 2 diabetes mellitus, with necrosis of bone ([place])   . Anemia, chronic disease 11/10/2019  . Acute  osteomyelitis of left foot ([place]) 11/10/2019  . Marijuana use 11/10/2019  . Wound dehiscence 08/31/2019  . Osteomyelitis due to type 2 diabetes mellitus ([place]) 08/14/2019  . Acute osteomyelitis of toe of left foot ([place]) 08/14/2019  . Sepsis ([place]) 08/14/2019  . Hyponatremia 08/14/2019  . Normocytic anemia 08/14/2019  . B12 deficiency 08/14/2019  . Osteomyelitis of foot ([place])   . Cellulitis of toe, left   . Diabetic infection of left  foot ([place])   . Toe fracture, left 07/13/2019  . Cellulitis 07/12/2019  . GERD (gastroesophageal reflux disease) 12/19/2018  . Hepatic steatosis 06/18/2018  . Gastritis, erosive   . Dysphagia, idiopathic 12/27/2017  . Rectal bleeding 12/27/2017  . Intention tremor 06/03/2016  . Left knee pain 12/04/2015  . 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 Diabetic neuropathy ([place]) 10/08/2014  . Chest pain, rule out acute myocardial infarction 03/10/2014  . Thrombocytopenia ([place]) 03/10/2014  . ETOH abuse 03/10/2014  . Chest pain 03/10/2014  . Diabetes mellitus without complication ([place])   . Hypertension   . Hypercholesterolemia    PCP:  [name], PA-C Pharmacy:   [place], [place] [id number] [place] [id number] Phone: [phone] Fax: [phone]     Social Determinants of Health (SDOH) Interventions    Readmission Risk Interventions No flowsheet data found.

## 2019-12-25 NOTE — Progress Notes (Signed)
Inpatient Rehabilitation-Admissions Coordinator   Met with pt at the bedside (along with his sister [name] via phone). Due to lack of support at home and his history of healing issues after multiple LE surgeries, the patient and his sister have agreed that he would benefit most from a longer term rehab program, such a SNF.   AC will sign off and contact TOC team regarding request.   Raechel Ache, OTR/L  Rehab Admissions Coordinator  [phone] 12/25/2019 12:57 PM

## 2019-12-25 NOTE — Progress Notes (Addendum)
Occupational Therapy Treatment Patient Details Name: Eugene Diaz MRN: [id number] DOB: Nov 05, 1965 Today's Date: 12/25/2019    History of present illness Pt is a 54 y/o male admitted secondary to L foot wound infection s/p L transmetatarsal amputation on 11/13/19. He is now s/p I&D on 12/18/19. Planning for Charcot amputation on 12/21/19 per podiatry note 12/19/19. PMH including but not limited to DM and HTN. To OR 2/20 for for conversion to Chopart amputation, NWB   OT comments  Pt returning to bed from bathroom upon OT arrival. Pt reports he ambulated from bathroom to bed independently while managing his IV pole and RW. Pt reports he "got tangled up a bit", educated pt on importance of having staff present during all mobility. Pt required minA for functional mobility at RW level. He completed grooming after setupA while seated EOB. Pt verbalized appreciation of Chaplain services and reports he would like to continue this type of service at next venue if it is available. Educated pt on importance of elevating LLE above his heart to assist with edema control and healing process. Pt will continue to benefit from skilled OT services to maximize safety and independence with ADL/IADL and functional mobility. Will continue to follow acutely and progress as tolerated.   Per chart, pt and wife agreeable to SNF level therapies. However, I feel pt would continue to benefit greatly from CIR level therapies to maximize safety and independence with ADL/IADL and functional mobility and address pt's social emotional needs through available services such as neuro-psych.   Follow Up Recommendations  CIR    Equipment Recommendations  3 in 1 bedside commode    Recommendations for Other Services Other (comment)(continue chaplain support services)    Precautions / Restrictions Precautions Precautions: Fall Restrictions Weight Bearing Restrictions: Yes LLE Weight Bearing: Non weight bearing       Mobility  Bed Mobility Overal bed mobility: Modified Independent Bed Mobility: Sit to Supine              Transfers Overall transfer level: Needs assistance Equipment used: Rolling walker (2 wheeled) Transfers: Sit to/from [facility] Sit to Stand: Mod assist Stand pivot transfers: Min assist       General transfer comment: modA to powerup from lower surface    Balance Overall balance assessment: Needs assistance Sitting-balance support: Feet supported Sitting balance-Leahy Scale: Good     Standing balance support: Single extremity supported;During functional activity Standing balance-Leahy Scale: Poor Standing balance comment: continued to challenge dynamic balance                            ADL either performed or assessed with clinical judgement   ADL Overall ADL's : Needs assistance/impaired     Grooming: Set up;Sitting;Brushing hair Grooming Details (indicate cue type and reason): grooming while sitting EOB             Lower Body Dressing: Moderate assistance;Sit to/from stand Lower Body Dressing Details (indicate cue type and reason): modA to access BLE and stand from lower surface Toilet Transfer: Minimal assistance;RW Toilet Transfer Details (indicate cue type and reason): pt returning from bathroom upon OT arrival Toileting- Clothing Manipulation and Hygiene: Moderate assistance;Sit to/from stand Toileting - Clothing Manipulation Details (indicate cue type and reason): modA to powerup from lower surface     Functional mobility during ADLs: Minimal assistance;Rolling walker General ADL Comments: decreased activity tolerance, increased DoE following functional mobiltiy     Vision  Perception     Praxis      Cognition Arousal/Alertness: Awake/alert Behavior During Therapy: WFL for tasks assessed/performed Overall Cognitive Status: Within Functional Limits for tasks assessed                                  General Comments: pt verbalized appreciation for meeting with chaplain, reports if this is available at next venue he is interested in continuing serivces;pt in good spirits this session        Exercises     Shoulder Instructions       General Comments vss    Pertinent Vitals/ Pain       Pain Assessment: Faces Faces Pain Scale: Hurts even more Pain Location: L foot Pain Descriptors / Indicators: Aching Pain Intervention(s): Limited activity within patient's tolerance;Monitored during session  Home Living                                          Prior Functioning/Environment              Frequency  Min 2X/week        Progress Toward Goals  OT Goals(current goals can now be found in the care plan section)  Progress towards OT goals: Progressing toward goals  Acute Rehab OT Goals Patient Stated Goal: to get stronger OT Goal Formulation: With patient Time For Goal Achievement: 01/03/20 Potential to Achieve Goals: Good ADL Goals Pt Will Perform Grooming: with modified independence;sitting;standing Pt Will Perform Lower Body Bathing: with modified independence;with adaptive equipment;sit to/from stand Pt Will Perform Lower Body Dressing: with modified independence;with adaptive equipment;sit to/from stand Pt Will Transfer to Toilet: with modified independence;ambulating;bedside commode Pt Will Perform Toileting - Clothing Manipulation and hygiene: with modified independence;sit to/from stand Pt Will Perform Tub/Shower Transfer: Tub transfer;with modified independence;ambulating;rolling walker;tub bench  Plan Discharge plan remains appropriate    Co-evaluation                 AM-PAC OT "6 Clicks" Daily Activity     Outcome Measure   Help from another person eating meals?: None Help from another person taking care of personal grooming?: A Little Help from another person toileting, which includes using toliet, bedpan, or urinal?: A  Little Help from another person bathing (including washing, rinsing, drying)?: A Little Help from another person to put on and taking off regular upper body clothing?: A Little Help from another person to put on and taking off regular lower body clothing?: A Lot 6 Click Score: 18    End of Session Equipment Utilized During Treatment: Gait belt;Rolling walker  OT Visit Diagnosis: Unsteadiness on feet (R26.81);Other abnormalities of gait and mobility (R26.89);Pain Pain - Right/Left: Left Pain - part of body: Ankle and joints of foot   Activity Tolerance Patient tolerated treatment well   Patient Left in chair;with call bell/phone within reach;with chair alarm set   Nurse Communication Mobility status        Time: [id number] OT Time Calculation (min): 26 min  Charges: OT General Charges $OT Visit: 1 Visit OT Treatments $Self Care/Home Management : 23-37 mins  [place] Office: [place] 12/25/2019, 3:38 PM

## 2019-12-25 NOTE — Progress Notes (Signed)
Physical Therapy Treatment Patient Details Name: Eugene Diaz MRN: [id number] DOB: 1966-02-14 Today's Date: 12/25/2019    History of Present Illness Pt is a 54 y/o male admitted secondary to L foot wound infection s/p L transmetatarsal amputation on 11/13/19. He is now s/p I&D on 12/18/19. Planning for Charcot amputation on 12/21/19 per podiatry note 12/19/19. PMH including but not limited to DM and HTN. To OR 2/20 for for conversion to Chopart amputation, NWB    PT Comments    Pt demonstrated improved gait distance today with rolling scooter ambulating 75 ft before fatiguing. Pt continues to demonstrate difficulty with SLS dynamic balance requiring BUE support. He was able to perform most transfers with increased time with verbal cues for sequencing and minA for stabilizing RW due to increased reliance on UE pull during sit>stand transition. Pt demonstrates high motivation and would be great to progress with therapy to become more independent working on LE strength, dynamic and static balance, and activity tolerance.    Follow Up Recommendations  SNF     Equipment Recommendations  Other (comment)(knee scooter)    Recommendations for Other Services       Precautions / Restrictions Precautions Precautions: Fall Restrictions Weight Bearing Restrictions: Yes LLE Weight Bearing: Non weight bearing    Mobility  Bed Mobility Overal bed mobility: Modified Independent Bed Mobility: Sit to Supine     Supine to sit: Supervision Sit to supine: Supervision   General bed mobility comments: no physical assistance needed  Transfers Overall transfer level: Needs assistance Equipment used: Rolling walker (2 wheeled) Transfers: Sit to/from [facility] Sit to Stand: Min assist(assistance for stability of RW today) Stand pivot transfers: Min assist       General transfer comment: Pt demonstrated improved form following OT and with mild verbal cues, demonstrating minA  required on medium surface (bed)  Ambulation/Gait Ambulation/Gait assistance: Min assist Gait Distance (Feet): 75 Feet Assistive device: (Knee Scooter)   Gait velocity: decreased   General Gait Details: Close guard with patient demonstrating no near LOB with knee scooter, fatigued at end of ambulation       Balance Overall balance assessment: Needs assistance Sitting-balance support: Feet supported Sitting balance-Leahy Scale: Good     Standing balance support: Single extremity supported;Bilateral upper extremity supported Standing balance-Leahy Scale: Poor Standing balance comment: Pt demonstrates difficulty with SLS, requiring BUE support during standing activities for safety      Cognition Arousal/Alertness: Awake/alert Behavior During Therapy: WFL for tasks assessed/performed Overall Cognitive Status: Within Functional Limits for tasks assessed        General Comments: pt verbalized appreciation for meeting with chaplain, reports if this is available at next venue he is interested in continuing serivces;pt in good spirits this session      Exercises Other Exercises Other Exercises: Sit to stands from high bed x 25    General Comments General comments (skin integrity, edema, etc.): vss      Pertinent Vitals/Pain Pain Assessment: Faces Faces Pain Scale: Hurts a little bit Pain Location: L foot Pain Descriptors / Indicators: Aching Pain Intervention(s): Limited activity within patient's tolerance;Monitored during session           PT Goals (current goals can now be found in the care plan section) Acute Rehab PT Goals Patient Stated Goal: to get stronger PT Goal Formulation: With patient Time For Goal Achievement: 01/01/20 Potential to Achieve Goals: Good Progress towards PT goals: Progressing toward goals    Frequency    Min 3X/week  PT Plan Discharge plan needs to be updated       AM-PAC PT "6 Clicks" Mobility   Outcome Measure  Help  needed turning from your back to your side while in a flat bed without using bedrails?: None Help needed moving from lying on your back to sitting on the side of a flat bed without using bedrails?: None Help needed moving to and from a bed to a chair (including a wheelchair)?: None Help needed standing up from a chair using your arms (e.g., wheelchair or bedside chair)?: A Little Help needed to walk in hospital room?: A Little Help needed climbing 3-5 steps with a railing? : A Lot 6 Click Score: 20    End of Session Equipment Utilized During Treatment: Gait belt;Other (comment)(Knee scooter) Activity Tolerance: Patient tolerated treatment well Patient left: in bed;with call bell/phone within reach;with family/visitor present Nurse Communication: Mobility status PT Visit Diagnosis: Other abnormalities of gait and mobility (R26.89);Muscle weakness (generalized) (M62.81);Pain Pain - Right/Left: Left Pain - part of body: Leg;Ankle and joints of foot     Time: 1526-1550 PT Time Calculation (min) (ACUTE ONLY): 24 min  Charges:  $Gait Training: 8-22 mins $Therapeutic Exercise: 8-22 mins                     Ann Held PT, DPT Acute Rehab [place] P: [place] 12/25/2019, 4:03 PM

## 2019-12-25 NOTE — Plan of Care (Signed)
  Problem: Education: Goal: Knowledge of General Education information will improve Description: Including pain rating scale, medication(s)/side effects and non-pharmacologic comfort measures Outcome: Progressing   Problem: Clinical Measurements: Goal: Will remain free from infection Outcome: Progressing   Problem: Elimination: Goal: Will not experience complications related to bowel motility Outcome: Progressing   Problem: Pain Managment: Goal: General experience of comfort will improve Outcome: Progressing   Problem: Safety: Goal: Ability to remain free from injury will improve Outcome: Progressing   Problem: Skin Integrity: Goal: Risk for impaired skin integrity will decrease Outcome: Progressing   

## 2019-12-25 NOTE — NC FL2 (Signed)
[place] MEDICAID FL2 LEVEL OF CARE SCREENING TOOL     IDENTIFICATION  Patient Name: Eugene Diaz Birthdate: 10-May-1966 Sex: male Admission Date (Current Location): 12/17/2019  [facility] and [place] Number:  Herbalist and Address:  [facility] [place] [address] [place], [place] [address]      Provider Number: [id number]  Attending Physician Name and Address:  Tawni Millers,*  Relative Name and Phone Number:  [name] [phone]    Current Level of Care: Hospital Recommended Level of Care: [place] Prior Approval Number:    Date Approved/Denied:   PASRR Number: [id number]  Discharge Plan: SNF    Current Diagnoses: Patient Active Problem List   Diagnosis Date Noted  . Osteomyelitis of ankle or foot, acute, left ([place]) 12/17/2019  . Hyperglycemia 12/17/2019  . Diabetic ulcer of left midfoot associated with type 2 diabetes mellitus, with necrosis of bone ([place])   . Anemia, chronic disease 11/10/2019  . Acute osteomyelitis of left foot ([place]) 11/10/2019  . Marijuana use 11/10/2019  . Wound dehiscence 08/31/2019  . 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 Acute osteomyelitis of toe of left foot ([place]) 08/14/2019  . Sepsis ([place]) 08/14/2019  . Hyponatremia 08/14/2019  . Normocytic anemia 08/14/2019  . B12 deficiency 08/14/2019  . Osteomyelitis of foot ([place])   . Cellulitis of toe, left   . Diabetic infection of left foot ([place])   . Toe fracture, left 07/13/2019  . Cellulitis 07/12/2019  . GERD (gastroesophageal reflux disease) 12/19/2018  . Hepatic steatosis 06/18/2018  . Gastritis, erosive   . Dysphagia, idiopathic 12/27/2017  . Rectal bleeding 12/27/2017  . Intention tremor 06/03/2016  . Left knee pain 12/04/2015  . Alcoholic peripheral neuropathy ([place]) 10/08/2014  . Diabetic neuropathy ([place]) 10/08/2014  . Chest pain, rule out acute myocardial infarction 03/10/2014  .  Thrombocytopenia ([place]) 03/10/2014  . ETOH abuse 03/10/2014  . Chest pain 03/10/2014  . Diabetes mellitus without complication ([place])   . Hypertension   . Hypercholesterolemia     Orientation RESPIRATION BLADDER Height & Weight     Self, Time, Situation, Place  Normal Continent, External catheter Weight: 270 lb 1 oz (122.5 kg) Height:  6' 2.02" (188 cm)  BEHAVIORAL SYMPTOMS/MOOD NEUROLOGICAL BOWEL NUTRITION STATUS      Continent Diet(see discharge summary)  AMBULATORY STATUS COMMUNICATION OF NEEDS Skin   Extensive Assist Verbally Normal, Surgical wounds                       Personal Care Assistance Level of Assistance  Bathing, Feeding, Dressing Bathing Assistance: Maximum assistance Feeding assistance: Independent Dressing Assistance: Maximum assistance     Functional Limitations Info             SPECIAL CARE FACTORS FREQUENCY  PT (By licensed PT), OT (By licensed OT)     PT Frequency: 5 times a week OT Frequency: 5 times a week            Contractures      Additional Factors Info                  Current Medications (12/25/2019):  This is the current hospital active medication list Current Facility-Administered Medications  Medication Dose Route Frequency Provider Last Rate Last Admin  . acetaminophen (TYLENOL) tablet 650 mg  650 mg Oral Q6H PRN Evelina Bucy, DPM       Or  .  acetaminophen (TYLENOL) suppository 650 mg  650 mg Rectal Q6H PRN Evelina Bucy, DPM      . acetaminophen (TYLENOL) tablet 500 mg  500 mg Oral Q6H Arrien, [name], MD      . albuterol (PROVENTIL) (2.5 MG/3ML) 0.083% nebulizer solution 2.5 mg  2.5 mg Inhalation Q6H PRN Evelina Bucy, DPM      . ALPRAZolam Duanne Moron) tablet 1 mg  1 mg Oral TID PRN Evelina Bucy, DPM   1 mg at 12/24/19 2128  . amitriptyline (ELAVIL) tablet 100 mg  100 mg Oral QHS Evelina Bucy, DPM   100 mg at 12/24/19 2025  . atorvastatin (LIPITOR) tablet 40 mg  40 mg Oral QPM Evelina Bucy, DPM   40 mg at 12/24/19 1713  . DULoxetine (CYMBALTA) DR capsule 60 mg  60 mg Oral Daily Evelina Bucy, DPM   60 mg at 12/25/19 0911  . enoxaparin (LOVENOX) injection 40 mg  40 mg Subcutaneous Q24H Alma Friendly, MD   40 mg at 12/24/19 1509  . ferrous sulfate tablet 325 mg  325 mg Oral Daily Evelina Bucy, DPM   325 mg at 12/25/19 [id number]  . furosemide (LASIX) tablet 20 mg  20 mg Oral Daily Evelina Bucy, DPM   20 mg at 12/25/19 0910  . gabapentin (NEURONTIN) capsule 900 mg  900 mg Oral BID WC Evelina Bucy, DPM   900 mg at 12/25/19 1213   And  . gabapentin (NEURONTIN) capsule 1,200 mg  1,200 mg Oral QHS Evelina Bucy, DPM   1,200 mg at 12/24/19 2026  . glycopyrrolate (ROBINUL) tablet 1 mg  1 mg Oral BID Evelina Bucy, DPM   1 mg at 12/25/19 [id number]  . hydrOXYzine (ATARAX/VISTARIL) tablet 25 mg  25 mg Oral QID PRN Evelina Bucy, DPM      . ibuprofen (ADVIL) tablet 400 mg  400 mg Oral TID WC Arrien, [name], MD      . insulin aspart (novoLOG) injection 0-15 Units  0-15 Units Subcutaneous TID WC Evelina Bucy, DPM   3 Units at 12/25/19 1213  . insulin aspart (novoLOG) injection 0-5 Units  0-5 Units Subcutaneous QHS Evelina Bucy, DPM   2 Units at 12/23/19 2107  . lisinopril (ZESTRIL) tablet 20 mg  20 mg Oral Daily Evelina Bucy, DPM   20 mg at 12/25/19 0911  . morphine 2 MG/ML injection 1 mg  1 mg Intravenous Q4H PRN Arrien, [name], MD      . oxyCODONE (Oxy IR/ROXICODONE) immediate release tablet 10 mg  10 mg Oral Q4H PRN Arrien, [name], MD      . pantoprazole (PROTONIX) EC tablet 40 mg  40 mg Oral Daily Evelina Bucy, DPM   40 mg at 12/25/19 0911  . piperacillin-tazobactam (ZOSYN) IVPB 3.375 g  3.375 g Intravenous Q8H Evelina Bucy, DPM 12.5 mL/hr at 12/25/19 1218 3.375 g at 12/25/19 1218  . primidone (MYSOLINE) tablet 50 mg  50 mg Oral BID Evelina Bucy, DPM   50 mg at 12/25/19 0910  . vitamin B-12 (CYANOCOBALAMIN) tablet 1,000  mcg  1,000 mcg Oral Daily Evelina Bucy, DPM   1,000 mcg at 12/25/19 [id number]     Discharge Medications: Please see discharge summary for a list of discharge medications.  Relevant Imaging Results:  Relevant Lab Results:   Additional Information ss [id number] Will require 4 weeks of IV abx zosyn 3.375gm IV Q8H (  4 hr inf) start 12/21/19  Atilano Median, LCSW

## 2019-12-25 NOTE — Progress Notes (Signed)
Responded to Spiritual Consult indicating Eugene Diaz has experienced a lot of life changes recently.  Initiated a relationship of care and concern for Eugene Diaz.  Employed active listening and ministry of presence as Eugene Diaz shared what's on his heart.  He mostly grieves losing his wife to heart disease last August.  He misses his dog, Sm[place]ie Joe.  And he worries this third surgery on his foot won't be the last.  He has experienced a lot of downwardly cascading events.  Plan to visit Eugene Diaz again soon.  [place] Chaplain Resident

## 2019-12-26 ENCOUNTER — Inpatient Hospital Stay: Payer: Self-pay

## 2019-12-26 DIAGNOSIS — E1141 Type 2 diabetes mellitus with diabetic mononeuropathy: Secondary | ICD-10-CM

## 2019-12-26 LAB — CBC WITH DIFFERENTIAL/PLATELET
Abs Immature Granulocytes: 0.02 10*3/uL (ref 0.00–0.07)
Basophils Absolute: 0 10*3/uL (ref 0.0–0.1)
Basophils Relative: 1 %
Eosinophils Absolute: 0.2 10*3/uL (ref 0.0–0.5)
Eosinophils Relative: 5 %
HCT: 25 % — ABNORMAL LOW (ref 39.0–52.0)
Hemoglobin: 8 g/dL — ABNORMAL LOW (ref 13.0–17.0)
Immature Granulocytes: 0 %
Lymphocytes Relative: 32 %
Lymphs Abs: 1.6 10*3/uL (ref 0.7–4.0)
MCH: 29.5 pg (ref 26.0–34.0)
MCHC: 32 g/dL (ref 30.0–36.0)
MCV: 92.3 fL (ref 80.0–100.0)
Monocytes Absolute: 0.4 10*3/uL (ref 0.1–1.0)
Monocytes Relative: 8 %
Neutro Abs: 2.7 10*3/uL (ref 1.7–7.7)
Neutrophils Relative %: 54 %
Platelets: 193 10*3/uL (ref 150–400)
RBC: 2.71 MIL/uL — ABNORMAL LOW (ref 4.22–5.81)
RDW: 16.1 % — ABNORMAL HIGH (ref 11.5–15.5)
WBC: 5.1 10*3/uL (ref 4.0–10.5)
nRBC: 0 % (ref 0.0–0.2)

## 2019-12-26 LAB — BASIC METABOLIC PANEL
Anion gap: 3 — ABNORMAL LOW (ref 5–15)
BUN: 15 mg/dL (ref 6–20)
CO2: 27 mmol/L (ref 22–32)
Calcium: 8.9 mg/dL (ref 8.9–10.3)
Chloride: 101 mmol/L (ref 98–111)
Creatinine, Ser: 1.11 mg/dL (ref 0.61–1.24)
GFR calc Af Amer: >60 mL/min (ref >60)
GFR calc non Af Amer: >60 mL/min (ref >60)
Glucose, Bld: 181 mg/dL — ABNORMAL HIGH (ref 70–99)
Potassium: 4.2 mmol/L (ref 3.5–5.1)
Sodium: 131 mmol/L — ABNORMAL LOW (ref 135–145)

## 2019-12-26 LAB — SARS CORONAVIRUS 2 (TAT 6-24 HRS): SARS Coronavirus 2: NEGATIVE

## 2019-12-26 LAB — GLUCOSE, CAPILLARY
Glucose-Capillary: 141 mg/dL — ABNORMAL HIGH (ref 70–99)
Glucose-Capillary: 180 mg/dL — ABNORMAL HIGH (ref 70–99)
Glucose-Capillary: 176 mg/dL — ABNORMAL HIGH (ref 70–99)
Glucose-Capillary: 185 mg/dL — ABNORMAL HIGH (ref 70–99)

## 2019-12-26 NOTE — Progress Notes (Signed)
Physical Therapy Treatment Patient Details Name: Eugene Diaz MRN: [id number] DOB: 09/27/1966 Today's Date: 12/26/2019    History of Present Illness Pt is a 54 y/o male admitted secondary to L foot wound infection s/p L transmetatarsal amputation on 11/13/19. He is now s/p I&D on 12/18/19. Planning for Charcot amputation on 12/21/19 per podiatry note 12/19/19. PMH including but not limited to DM and HTN. To OR 2/20 for for conversion to Chopart amputation, NWB    PT Comments    Pt continues to progress slowly with PT goals. He was able to ambulate further today 141ft with knee scooter with supervision today. Pt was able to demonstrate improved navigation requiring minimal VCs for 3 point turning, maneuvering with in room and around obstacles. Pt reports no DOMS from yesterday sit to stands, and was able to progress to 2 sets today. Pt demonstrated moderate fatigue following sit to stands and reported bilateral fatigue with knee scooter usage today. Pt agreeable to progress advanced balance and reaching to work on further L LE strengthening later today.   Follow Up Recommendations  SNF     Equipment Recommendations  Other (comment)       Precautions / Restrictions Precautions Precautions: Fall Restrictions Weight Bearing Restrictions: Yes LLE Weight Bearing: Non weight bearing Other Position/Activity Restrictions: OK to bear weight through L knee for knee scooter use per conversation with [name]    Mobility  Bed Mobility Overal bed mobility: Modified Independent Bed Mobility: Sit to Supine     Supine to sit: Supervision Sit to supine: Supervision   General bed mobility comments: no physical assistance needed  Transfers Overall transfer level: Needs assistance Equipment used: Rolling walker (2 wheeled) Transfers: Sit to/from [facility] Sit to Stand: Min guard Stand pivot transfers: Min guard       General transfer comment: Pt continues to demonstrate  improved transfers, requiring set up assist primarily today. He continues to fatigue quickly.  Ambulation/Gait Ambulation/Gait assistance: Min guard Gait Distance (Feet): 100 Feet Assistive device: (Knee walker) Gait Pattern/deviations: Step-through pattern;Decreased step length - right;Decreased step length - left;Decreased stride length     General Gait Details: Close guard with patient demonstrating no near LOB with knee scooter, fatigued at end of ambulation     Balance Overall balance assessment: Needs assistance Sitting-balance support: Feet supported Sitting balance-Leahy Scale: Good     Standing balance support: Single extremity supported;Bilateral upper extremity supported Standing balance-Leahy Scale: Poor(Needs support due to SLS) Standing balance comment: Pt demonstrates difficulty with SLS, requiring BUE support during standing activities for safety      Cognition Arousal/Alertness: Awake/alert Behavior During Therapy: WFL for tasks assessed/performed Overall Cognitive Status: Within Functional Limits for tasks assessed           General Comments: Pt verbalized agreement to work on advanced balance later today if time permits      Exercises Other Exercises Other Exercises: Sit to stands from high bed x 25(pre ambulation) Other Exercises: Sit to stand from mid range bed x 10(post ambulation)    General Comments General comments (skin integrity, edema, etc.): VSS      Pertinent Vitals/Pain Pain Assessment: Faces Faces Pain Scale: Hurts a little bit Pain Location: L foot Pain Descriptors / Indicators: Aching           PT Goals (current goals can now be found in the care plan section) Acute Rehab PT Goals Patient Stated Goal: to get stronger PT Goal Formulation: With patient Time For Goal  Achievement: 01/01/20 Potential to Achieve Goals: Good Progress towards PT goals: Progressing toward goals    Frequency    Min 3X/week      PT Plan  Current plan remains appropriate       AM-PAC PT "6 Clicks" Mobility   Outcome Measure  Help needed turning from your back to your side while in a flat bed without using bedrails?: None Help needed moving from lying on your back to sitting on the side of a flat bed without using bedrails?: None Help needed moving to and from a bed to a chair (including a wheelchair)?: None Help needed standing up from a chair using your arms (e.g., wheelchair or bedside chair)?: A Little Help needed to walk in hospital room?: A Little Help needed climbing 3-5 steps with a railing? : A Lot 6 Click Score: 20    End of Session Equipment Utilized During Treatment: Gait belt;Other (comment)(knee scooter) Activity Tolerance: Patient tolerated treatment well Patient left: in bed;with call bell/phone within reach;with nursing/sitter in room Nurse Communication: Mobility status PT Visit Diagnosis: Other abnormalities of gait and mobility (R26.89);Muscle weakness (generalized) (M62.81);Pain Pain - Right/Left: Left Pain - part of body: Leg;Ankle and joints of foot     Time: [id number] PT Time Calculation (min) (ACUTE ONLY): 26 min  Charges:  $Gait Training: 8-22 mins $Therapeutic Exercise: 8-22 mins                     Ann Held PT, DPT Acute Rehab [place] P: [place] 12/26/2019, 9:40 AM

## 2019-12-26 NOTE — Progress Notes (Signed)
PHARMACY CONSULT NOTE FOR:  OUTPATIENT  PARENTERAL ANTIBIOTIC THERAPY (OPAT)  Indication: left foot osteomyelitis Regimen: Zosyn 3.375g IV q6h (30 minute infusion) End date: 01/18/2020  IV antibiotic discharge orders are pended. To discharging provider:  please sign these orders via discharge navigator,  Select New Orders & click on the button choice - Manage This Unsigned Work.     Thank you for allowing pharmacy to be a part of this patient's care.  Cristela Felt, PharmD PGY1 Pharmacy Resident [employer]: [phone]  12/26/2019, 2:15 PM

## 2019-12-26 NOTE — Progress Notes (Addendum)
PROGRESS NOTE    Eugene Diaz  [id number] DOB: 12/23/1965 DOA: 12/17/2019 PCP: [name], PA-C    Brief Narrative:  Patient was admitted to the hospital with left foot osteomyelitis.  54 year old male who presented with left foot wound infection.  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  At home he was noted to have foul-smelling drainage from his left foot wound, associated with erythema and worsening pain.  On his initial physical examination blood pressure 174/78, heart rate 88, temperature 98.3, oxygen saturation 98%.  His lungs are clear to auscultation bilaterally, heart S1-S2 present rhythmic, soft abdomen, left foot wound with purulent drainage, positive erythema and edema.  Left foot x-ray showed mid foot amputation with progressive soft tissue swelling and cortical erosion of the cuneiforms and possibly cuboid bones compatible with osteomyelitis and soft tissue infection.  Patient underwent 02/17 wound debridement left foot, partial excision of cuboid bone and partial excision of the lateral cuneiform bone.   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   His cultures had grew MSSA, Enterococcus faecalis, Enterobacter cloacae, ID recommended Zosyn for antibiotic therapy for 4 weeks duration.  Patient is pending placement at SNF.    Assessment & Plan:   Principal Problem:   Osteomyelitis of ankle or foot, acute, left (HCC) Active Problems:   Diabetes mellitus without complication (HCC)   Hypertension   Hypercholesterolemia   Diabetic neuropathy (HCC)   GERD (gastroesophageal reflux disease)   Diabetic infection of left foot (HCC)   Anemia, chronic disease   Hyperglycemia   1. Left foot wound dehiscence/ osteomyelitis. Sp  debridement, 2/17, chopart amputation and application of wound vac 02/20.   Continue pain control with acetaminophen, ibuprofen, oxycodone and as needed morphine IV. Tolerating well antibiotic therapy with Zosyn, scheduled stop date is march 20. Will request pic line for prolonged antibiotic therapy.   Continue local wound care and wound vac per surgery recommendations, if treatment fails patient will need BKA.    Pending SNF placement.  2, T2DM ( Hgb A1c 6,4)/ dyslipidemia. Fasting glucose this am 181, will continue glucose cover and monitoring with insulin sliding scale, patient is tolerating po well.    On statin therapy.   3. HTN. On lisinopril for blood pressure control.   4. Obesity. Calculated BMI is 34,6. Will need snf at discharge to continue pt.   5. Anxiety and depression. On hydroxyzine, alprazolam, Cymbalta. No confusion or aagitation  DVT prophylaxis: enoxaparin   Code Status:  full Family Communication: no family at the bedside Disposition Plan/ discharge barriers:  Pending SNF placement.    Consultants:   Podiatry   ID   Procedures:   Right foot debridement and amputation   Antimicrobials:   Zosyn     Subjective: Left foot with improved pain with analgesics, no nausea or vomiting, no chest pain or dyspnea.   Objective: Vitals:   12/25/19 1351 12/25/19 1957 12/26/19 0329 12/26/19 0817  BP: 139/74 (!) 152/79 (!) 128/95 131/68  Pulse: 79 86 75 81  Resp: 18 18 18 17   Temp: 97.8 F (36.6 C)  98 F (36.7 C) 97.9 F (36.6 C)  TempSrc: Oral   Oral  SpO2: 98% 98% 98% 99%  Weight:      Height:        Intake/Output  Summary (Last 24 hours) at 12/26/2019 1103 Last data filed at 12/26/2019 0700 Gross per 24 hour  Intake --  Output 2075 ml  Net -2075 ml   Filed Weights   12/17/19 2210 12/18/19 0000 12/18/19 1428  Weight: 122.5 kg 122.5 kg 122.5 kg    Examination:   General: Not in pain or dyspnea. Deconditioned  Neurology: Awake and  alert, non focal  E ENT: mild pallor, no icterus, oral mucosa moist Cardiovascular: No JVD. S1-S2 present, rhythmic, no gallops, rubs, or murmurs. No lower extremity edema. Pulmonary: positive breath sounds bilaterally, adequate air movement, no wheezing, rhonchi or rales. Gastrointestinal. Abdomen with no organomegaly, non tender, no rebound or guarding Skin. No rashes Musculoskeletal: right foot with dressing in place, wound vac in place.      Data Reviewed: I have personally reviewed following labs and imaging studies  CBC: Recent Labs  Lab 12/22/19 0403 12/23/19 0445 12/24/19 0314 12/25/19 0255 12/26/19 0315  WBC 6.9 6.8 5.1 4.3 5.1  NEUTROABS 4.7 4.4 2.5 2.1 2.7  HGB 9.1* 8.6* 8.3* 8.1* 8.0*  HCT 28.1* 27.0* 26.1* 25.1* 25.0*  MCV 90.6 92.2 91.3 90.3 92.3  PLT 208 180 176 182 0000000   Basic Metabolic Panel: Recent Labs  Lab 12/22/19 0403 12/23/19 0445 12/24/19 0314 12/25/19 0255 12/26/19 0315  NA 137 135 130* 136 131*  K 4.0 3.9 4.1 4.1 4.2  CL 100 99 97* 102 101  CO2 26 26 23 26 27   GLUCOSE 175* 188* 187* 165* 181*  BUN 10 12 13 14 15   CREATININE 0.97 1.13 0.99 1.06 1.11  CALCIUM 8.9 9.1 8.8* 8.9 8.9   GFR: Estimated Creatinine Clearance: 105.8 mL/min (by C-G formula based on SCr of 1.11 mg/dL). Liver Function Tests: No results for input(s): AST, ALT, ALKPHOS, BILITOT, PROT, ALBUMIN in the last 168 hours. No results for input(s): LIPASE, AMYLASE in the last 168 hours. No results for input(s): AMMONIA in the last 168 hours. Coagulation Profile: No results for input(s): INR, PROTIME in the last 168 hours. Cardiac Enzymes: No results for input(s): CKTOTAL, CKMB, CKMBINDEX, TROPONINI in the last 168 hours. BNP (last 3 results) No results for input(s): PROBNP in the last 8760 hours. HbA1C: No results for input(s): HGBA1C in the last 72 hours. CBG: Recent Labs  Lab 12/25/19 0633 12/25/19 1120 12/25/19 1705 12/25/19 1959 12/26/19 0635  GLUCAP 154* 155*  169* 137* 141*   Lipid Profile: No results for input(s): CHOL, HDL, LDLCALC, TRIG, CHOLHDL, LDLDIRECT in the last 72 hours. Thyroid Function Tests: No results for input(s): TSH, T4TOTAL, FREET4, T3FREE, THYROIDAB in the last 72 hours. Anemia Panel: No results for input(s): VITAMINB12, FOLATE, FERRITIN, TIBC, IRON, RETICCTPCT in the last 72 hours.    Radiology Studies: I have reviewed all of the imaging during this hospital visit personally     Scheduled Meds: . acetaminophen  500 mg Oral Q6H  . amitriptyline  100 mg Oral QHS  . atorvastatin  40 mg Oral QPM  . DULoxetine  60 mg Oral Daily  . enoxaparin (LOVENOX) injection  40 mg Subcutaneous Q24H  . ferrous sulfate  325 mg Oral Daily  . furosemide  20 mg Oral Daily  . gabapentin  900 mg Oral BID WC   And  . gabapentin  1,200 mg Oral QHS  . glycopyrrolate  1 mg Oral BID  . ibuprofen  400 mg Oral TID WC  . insulin aspart  0-15 Units Subcutaneous TID WC  . insulin aspart  0-5  Units Subcutaneous QHS  . lisinopril  20 mg Oral Daily  . pantoprazole  40 mg Oral Daily  . primidone  50 mg Oral BID  . vitamin B-12  1,000 mcg Oral Daily   Continuous Infusions: . piperacillin-tazobactam (ZOSYN)  IV 3.375 g (12/26/19 0336)     LOS: 9 days        Saryn Cherry Gerome Apley, MD

## 2019-12-26 NOTE — Plan of Care (Signed)

## 2019-12-27 LAB — GLUCOSE, CAPILLARY
Glucose-Capillary: 111 mg/dL — ABNORMAL HIGH (ref 70–99)
Glucose-Capillary: 95 mg/dL (ref 70–99)
Glucose-Capillary: 156 mg/dL — ABNORMAL HIGH (ref 70–99)

## 2019-12-27 MED ORDER — SODIUM CHLORIDE 0.9% FLUSH
10.0000 mL | Freq: Two times a day (BID) | INTRAVENOUS | Status: DC
  Administered 2019-12-27: 10 mL

## 2019-12-27 MED ORDER — IBUPROFEN 400 MG PO TABS: 400.0000 mg | ORAL_TABLET | Freq: Three times a day (TID) | ORAL | 0 refills | Status: DC | PRN

## 2019-12-27 MED ORDER — ACETAMINOPHEN 325 MG PO TABS: 650.0000 mg | ORAL_TABLET | Freq: Four times a day (QID) | ORAL | 0 refills | Status: DC | PRN

## 2019-12-27 MED ORDER — PIPERACILLIN-TAZOBACTAM IV (FOR PTA / DISCHARGE USE ONLY): 3.3750 g | Freq: Four times a day (QID) | INTRAVENOUS | 0 refills | Status: DC

## 2019-12-27 MED ORDER — OXYCODONE HCL 10 MG PO TABS: 10.0000 mg | ORAL_TABLET | Freq: Four times a day (QID) | ORAL | 0 refills | Status: DC | PRN

## 2019-12-27 MED ORDER — ALPRAZOLAM 1 MG PO TABS: 1.0000 mg | ORAL_TABLET | Freq: Three times a day (TID) | ORAL | 0 refills | Status: DC | PRN

## 2019-12-27 MED ORDER — SODIUM CHLORIDE 0.9% FLUSH: 10.0000 mL | INTRAVENOUS | Status: DC | PRN

## 2019-12-27 MED ORDER — CHLORHEXIDINE GLUCONATE CLOTH 2 % EX PADS
6.0000 | MEDICATED_PAD | Freq: Every day | CUTANEOUS | Status: DC
  Administered 2019-12-27: 6 via TOPICAL

## 2019-12-27 NOTE — TOC Transition Note (Signed)
Transition of Care [facility]) - CM/SW Discharge Note   Patient Details  Name: JAYLAN SERVATIUS MRN: [id number] Date of Birth: Mar 24, 1966  [facility] CM/SW Contact:  Atilano Median, LCSW Phone Number: 12/27/2019, 3:53 PM   Clinical Narrative:    Discharged to [place]. Patient aware and agreeable to this plan. Number to call report [phone] given to unit RN Jana Half. DC summary sent via Epic. No other needs at this time. Case closed to this CSW.    Final next level of care: Skilled Nursing Facility Barriers to Discharge: Barriers Resolved   Patient Goals and CMS Choice Patient states their goals for this hospitalization and ongoing recovery are:: get well enough to return home on my own CMS Medicare.gov Compare Post Acute Care list provided to:: Patient Choice offered to / list presented to : Patient  Discharge Placement   Existing PASRR number confirmed : 12/27/19          Patient chooses bed at: [facility] Patient to be transferred to facility by: [place] Name of family member notified: patient Patient and family notified of of transfer: 12/27/19  Discharge Plan and Services In-house Referral: Clinical Social Work   Post Acute Care Choice: [facility]                               Social Determinants of Health (SDOH) Interventions     Readmission Risk Interventions No flowsheet data found.

## 2019-12-27 NOTE — Clinical Social Work Note (Signed)
COVID collected on 2/25. Waiting to hear back from [facility] with [facility] on bed offer as patient is medicaid only.

## 2019-12-27 NOTE — Discharge Summary (Signed)
Physician Discharge Summary  JERIMY JOHANSON ZOX:[id number] DOB: 05/23/1966 DOA: 12/17/2019  PCP: [name], PA-C  Admit date: 12/17/2019 Discharge date: 12/27/2019  Admitted From: Home  Disposition:  SNF   Recommendations for Outpatient Follow-up and new medication changes:  1. Follow up with Collene Mares L, PA-C in 7 days.  2. Continue antibiotic therapy with Zosyn to complete on January 20, 2020. 3. Remove picc line after completion of antibiotic therapy on January 20, 2020.  4. Continue wound care, wound VAC as instructed. 5. Hold on furosemide.    Wound care team: Plan:Incisional Vac to LLE incision line. Change dressing Q 5 days or to avoid dressing change on the weekend. Apply Mepitel contact layer before applying black foam each time, bridge track pad to anterior LLE, and cover with Ace wrap.  Monitor the wound area's for worsening of condition such as, Signs/symptoms of infection, Increase in size, development of or worsening of odor, development of pain, or increased pain at the affected locations. Notify the medical team if any of these develop. .  Home Health:na   Equipment/Devices: na    Discharge Condition: stable  CODE STATUS:  full Diet recommendation: heart healthy and diabetic prudent.   Brief/Interim Summary: Patient was admitted to the hospital with left foot osteomyelitis. Now sp debridement and Chopart amputation. On antibiotic therapy until March 46, 1766  54 year old male who presented with left foot wound infection. 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 At home he was noted to have foul-smelling drainage from his left foot wound, associated with erythema and worsening pain. On his initial physical examination blood pressure 174/78, heart rate 88, temperature 98.3, oxygen saturation 98%. His lungs were clear to  auscultation bilaterally, heart S1-S2 present rhythmic, soft abdomen, left foot wound with purulent drainage, positive erythema and edema. Sodium 136, potassium 4.0, chloride 101, bicarb 28, glucose 169, BUN 21, creatinine 0.92, white count 5.3, hemoglobin 9.9, hematocrit 31.4, platelets 185.  SARS COVID-19 negative.  Left foot x-ray showed mid foot amputation with progressive soft tissue swelling and cortical erosion of the cuneiforms and possibly cuboid bones compatible with osteomyelitis and soft tissue infection.  Patient underwent 02/17 wound debridement left foot, partial excision of cuboid bone and partial excision of the lateral cuneiform bone.   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   His cultures had grew MSSA, Enterococcus faecalis, Enterobacter cloacae, ID recommended Zosyn for antibiotic therapy for 4 weeks duration.  Patient is pending placement at SNF.   1.  Left foot wound dehiscence/osteomyelitis.  Patient was admitted to the medical ward, he received broad-spectrum antibiotic therapy with intravenous Zosyn.  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  Wound VAC was applied a PICC line was placed for prolonged antibiotic therapy.  Infectious disease recommended 4 weeks of antibiotic therapy with Zosyn.  Patient will continue wound care at a skilled nursing facility.  If patient fails treatment he will need a below-the-knee amputation.  Continue pain control with acetaminophen, ibuprofen and as needed oxycodone.  2.  Type 2 diabetes mellitus, hemoglobin A1c 6.4,/dyslipidemia.  His glucose remained well controlled with insulin therapy.  Continue statin therapy at discharge.  3.  Hypertension.  Continue blood pressure control with lisinopril.  4.  Obesity.  His  calculated BMI is 34.6, continue outpatient follow-up.  5.   Anxiety/depression.  Continue hydroxyzine, alprazolam and Cymbalta.  Discharge Diagnoses:  Principal Problem:   Osteomyelitis of ankle or foot, acute, left (HCC) Active Problems:   Diabetes mellitus without complication (HCC)   Hypertension   Hypercholesterolemia   Diabetic neuropathy (HCC)   GERD (gastroesophageal reflux disease)   Diabetic infection of left foot (HCC)   Anemia, chronic disease   Hyperglycemia    Discharge Instructions   Allergies as of 12/27/2019   No Known Allergies     Medication List    STOP taking these medications   furosemide 20 MG tablet Commonly known as: LASIX   HYDROcodone-acetaminophen 5-325 MG tablet Commonly known as: Norco   lidocaine 5 % ointment Commonly known as: XYLOCAINE   ondansetron 4 MG disintegrating tablet Commonly known as: Zofran ODT     TAKE these medications   acetaminophen 325 MG tablet Commonly known as: TYLENOL Take 2 tablets (650 mg total) by mouth every 6 (six) hours as needed for mild pain (or Fever >/= 101).   albuterol 108 (90 Base) MCG/ACT inhaler Commonly known as: VENTOLIN HFA Inhale 2 puffs into the lungs every 6 (six) hours as needed for wheezing or shortness of breath.   ALPRAZolam 1 MG tablet Commonly known as: XANAX Take 1 tablet (1 mg total) by mouth 3 (three) times daily as needed for anxiety. What changed:   when to take this  reasons to take this   amitriptyline 100 MG tablet Commonly known as: ELAVIL Take 100 mg by mouth at bedtime.   atorvastatin 40 MG tablet Commonly known as: LIPITOR Take 40 mg by mouth every evening.   docusate sodium 100 MG capsule Commonly known as: COLACE Take 100 mg by mouth daily as needed for mild constipation.   DULoxetine 60 MG capsule Commonly known as: CYMBALTA Take 60 mg by mouth daily.   ferrous sulfate 325 (65 FE) MG tablet Take 1 tablet (325 mg total) by mouth daily.   gabapentin 300 MG capsule Commonly known as: NEURONTIN Take 971m in  the morning and at noon, then take 12021mat bedtime What changed:   how much to take  how to take this  when to take this   glimepiride 4 MG tablet Commonly known as: AMARYL Take 4 mg by mouth daily.   glycopyrrolate 1 MG tablet Commonly known as: ROBINUL Take 1 mg by mouth 2 (two) times daily.   hydrOXYzine 25 MG tablet Commonly known as: ATARAX/VISTARIL Take 25 mg by mouth 4 (four) times daily as needed for anxiety.   ibuprofen 400 MG tablet Commonly known as: ADVIL Take 1 tablet (400 mg total) by mouth every 8 (eight) hours as needed for moderate pain.   lisinopril 20 MG tablet Commonly known as: ZESTRIL Take 20 mg by mouth daily.   omeprazole 20 MG capsule Commonly known as: PRILOSEC TAKE 1 CAPSULE WITH BREAKFAST AND SUPPER. What changed: See the new instructions.   Oxycodone HCl 10 MG Tabs Take 1 tablet (10 mg total) by mouth every 6 (six) hours as needed for severe pain. What changed:   medication strength  how much to take  when to take this  reasons to take this   piperacillin-tazobactam  IVPB Commonly known as: ZOSYN Inject 3.375 g into the vein every 6 (six) hours for 23 days. Indication:  Left foot osteomyelitis Last Day of Therapy:  01/18/2020 Labs - Once weekly:  CBC/D and BMP, Labs - Every  other week:  ESR and CRP   primidone 50 MG tablet Commonly known as: MYSOLINE Take 1 tablet (50 mg total) by mouth 2 (two) times daily.   Voltaren 1 % Gel Generic drug: diclofenac Sodium Apply 2 g topically 4 (four) times daily as needed (for pain).            Home Infusion Instuctions  (From admission, onward)         Start     Ordered   12/27/19 0000  Home infusion instructions    Question:  Instructions  Answer:  Flushing of vascular access device: 0.9% NaCl pre/post medication administration and prn patency; Heparin 100 u/ml, 67m for implanted ports and Heparin 10u/ml, 516mfor all other central venous catheters.   12/27/19 1506           No Known Allergies  Consultations:  Orthopedics  ID    Procedures/Studies: DG Foot 2 Views Left  Result Date: 12/21/2019 CLINICAL DATA:  Reason for exam: post-op state EXAM: LEFT FOOT - 2 VIEW COMPARISON:  12/17/2019 and previous FINDINGS: There has been interval amputation just distal to the calcaneus and talus. Skin staples. There is some indistinctness of the distal cortical margin of the calcaneus. IMPRESSION: Postop changes of left foot amputation as above. Indistinctness of distal calcaneus cortex, cannot exclude osteomyelitis. MR may be useful for further evaluation. Electronically Signed   By: D Lucrezia Europe.D.   On: 12/21/2019 10:49   DG Foot Complete Left  Result Date: 12/17/2019 CLINICAL DATA:  Infection.  Rule out osteo. EXAM: LEFT FOOT - COMPLETE 3+ VIEW COMPARISON:  11/13/2019 FINDINGS: Midfoot amputation. Progression of extensive soft tissue swelling. Cortical erosions are seen in the cuneiform bones suggesting osteomyelitis. Possible erosive changes in the cuboid bone. Soft tissue calcification is present which appears dystrophic. IMPRESSION: Midfoot amputation. Progressive soft tissue swelling and cortical erosion of the cuneiforms and possibly cuboid bones compatible with osteomyelitis and soft tissue infection. Electronically Signed   By: ChFranchot Gallo.D.   On: 12/17/2019 19:12   [place]  Result Date: 12/26/2019 [facility], placement could not be confirmed due to current cardiac rhythm.     Procedures:  12/18/19  1) wound debridement left foot 2) partial excision of cuboid bone 3) partial excision of the lateral cuneiform bone  12/21/19 1) Chopart amputation left 2) Application of Wound VAC  Subjective: Patient is feeling better, right foot pain is controlled with analgesics, no nausea or vomiting, no chest pain or dyspnea.   Discharge Exam: Vitals:   12/27/19 0337 12/27/19 0912  BP: (!) 143/71 140/74  Pulse: 70 78   Resp: 15 18  Temp: 97.7 F (36.5 C) 98.6 F (37 C)  SpO2: 100% 96%   Vitals:   12/26/19 1541 12/26/19 2004 12/27/19 0337 12/27/19 0912  BP: (!) 151/76 (!) 144/74 (!) 143/71 140/74  Pulse: 84 79 70 78  Resp: _0 Temp: 98.1 F (36.7 C) 98.4 F (36.9 C) 97.7 F (36.5 C) 98.6 F (37 C)  TempSrc: Oral Oral Oral Oral  SpO2: 97% 100% 100% 96%  Weight:      Height:        General: Not in pain or dyspnea, deconditioned  Neurology: Awake and alert, non focal  E ENT: mild pallor, no icterus, oral mucosa moist Cardiovascular: No JVD. S1-S2 present, rhythmic, no gallops, rubs, or murmurs. No lower extremity edema. Pulmonary: positive breath sounds bilaterally, adequate air movement,  no wheezing, rhonchi or rales. Gastrointestinal. Abdomen with, no organomegaly, non tender, no rebound or guarding Skin. No rashes Musculoskeletal: right foot with dressing and wound vac in place, bloody drainage present.    The results of significant diagnostics from this hospitalization (including imaging, microbiology, ancillary and laboratory) are listed below for reference.     Microbiology: Recent Results (from the past 240 hour(s))  Culture, blood (routine x 2)     Status: None   Collection Time: 12/17/19  6:22 PM   Specimen: BLOOD RIGHT FOREARM  Result Value Ref Range Status   Specimen Description BLOOD RIGHT FOREARM  Final   Special Requests   Final    BOTTLES DRAWN AEROBIC ONLY Blood Culture adequate volume   Culture   Final    NO GROWTH 5 DAYS Performed at [facility], [address] [place], [place] [address]    Report Status 12/22/2019 FINAL  Final  Culture, blood (routine x 2)     Status: None   Collection Time: 12/17/19  6:22 PM   Specimen: Right Antecubital; Blood  Result Value Ref Range Status   Specimen Description RIGHT ANTECUBITAL  Final   Special Requests   Final    BOTTLES DRAWN AEROBIC AND ANAEROBIC Blood Culture adequate volume   Culture   Final    NO GROWTH  5 DAYS Performed at [facility], [address], [place], [place] [address]    Report Status 12/22/2019 FINAL  Final  Respiratory Panel by RT PCR (Flu A&B, Covid) - Nasopharyngeal Swab     Status: None   Collection Time: 12/17/19  7:42 PM   Specimen: Nasopharyngeal Swab  Result Value Ref Range Status   SARS Coronavirus 2 by RT PCR NEGATIVE NEGATIVE Final    Comment: (NOTE) SARS-CoV-2 target nucleic acids are NOT DETECTED. The SARS-CoV-2 RNA is generally detectable in upper respiratoy specimens during the acute phase of infection. The lowest concentration of SARS-CoV-2 viral copies this assay can detect is 131 copies/mL. A negative result does not preclude SARS-Cov-2 infection and should not be used as the sole basis for treatment or other patient management decisions. A negative result may occur with  improper specimen collection/handling, submission of specimen other than nasopharyngeal swab, presence of viral mutation(s) within the areas targeted by this assay, and inadequate number of viral copies (<131 copies/mL). A negative result must be combined with clinical observations, patient history, and epidemiological information. The expected result is Negative. Fact Sheet for Patients:  PinkCheek.be Fact Sheet for Healthcare Providers:  GravelBags.it This test is not yet ap proved or cleared by the [place] FDA and  has been authorized for detection and/or diagnosis of SARS-CoV-2 by FDA under an Emergency Use Authorization (EUA). This EUA will remain  in effect (meaning this test can be used) for the duration of the COVID-19 declaration under Section 564(b)(1) of the Act, 21 U.S.C. section 360bbb-3(b)(1), unless the authorization is terminated or revoked sooner.    Influenza A by PCR NEGATIVE NEGATIVE Final   Influenza B by PCR NEGATIVE NEGATIVE Final    Comment: (NOTE) The Xpert Xpress SARS-CoV-2/FLU/RSV assay is  intended as an aid in  the diagnosis of influenza from Nasopharyngeal swab specimens and  should not be used as a sole basis for treatment. Nasal washings and  aspirates are unacceptable for Xpert Xpress SARS-CoV-2/FLU/RSV  testing. Fact Sheet for Patients: PinkCheek.be Fact Sheet for Healthcare Providers: GravelBags.it This test is not yet approved or cleared by the [place] and  has been authorized for detection and/or diagnosis of SARS-CoV-2 by  FDA under an Emergency Use Authorization (EUA). This EUA will remain  in effect (meaning this test can be used) for the duration of the  Covid-19 declaration under Section 564(b)(1) of the Act, 21  U.S.C. section 360bbb-3(b)(1), unless the authorization is  terminated or revoked. Performed at [facility], [address], [place], [place] [address]   Aerobic/Anaerobic Culture (surgical/deep wound)     Status: None   Collection Time: 12/18/19  2:56 PM   Specimen: PATH Other; Tissue  Result Value Ref Range Status   Specimen Description WOUND LEFT FOOT  Final   Special Requests PT ON ZOSYN VANC  Final   Gram Stain   Final    RARE WBC PRESENT, PREDOMINANTLY PMN ABUNDANT GRAM POSITIVE COCCI ABUNDANT GRAM NEGATIVE RODS    Culture   Final    FEW ENTEROBACTER CLOACAE RARE STAPHYLOCOCCUS AUREUS FEW ENTEROCOCCUS FAECALIS FEW BACTEROIDES SPECIES BETA LACTAMASE NEGATIVE Performed at [facility], [place] [address] [place], [place] [address]    Report Status 12/22/2019 FINAL  Final   Organism ID, Bacteria ENTEROBACTER CLOACAE  Final   Organism ID, Bacteria ENTEROCOCCUS FAECALIS  Final   Organism ID, Bacteria STAPHYLOCOCCUS AUREUS  Final      Susceptibility   Enterobacter cloacae - MIC*    CEFAZOLIN >=64 RESISTANT Resistant     CEFEPIME <=0.12 SENSITIVE Sensitive     CEFTAZIDIME <=1 SENSITIVE Sensitive     CIPROFLOXACIN <=0.25 SENSITIVE Sensitive     GENTAMICIN <=1  SENSITIVE Sensitive     IMIPENEM 1 SENSITIVE Sensitive     TRIMETH/SULFA <=20 SENSITIVE Sensitive     PIP/TAZO <=4 SENSITIVE Sensitive     * FEW ENTEROBACTER CLOACAE   Enterococcus faecalis - MIC*    AMPICILLIN <=2 SENSITIVE Sensitive     VANCOMYCIN 1 SENSITIVE Sensitive     GENTAMICIN SYNERGY SENSITIVE Sensitive     * FEW ENTEROCOCCUS FAECALIS   Staphylococcus aureus - MIC*    CIPROFLOXACIN <=0.5 SENSITIVE Sensitive     ERYTHROMYCIN <=0.25 SENSITIVE Sensitive     GENTAMICIN <=0.5 SENSITIVE Sensitive     OXACILLIN 0.5 SENSITIVE Sensitive     TETRACYCLINE <=1 SENSITIVE Sensitive     VANCOMYCIN 1 SENSITIVE Sensitive     TRIMETH/SULFA <=10 SENSITIVE Sensitive     CLINDAMYCIN <=0.25 SENSITIVE Sensitive     RIFAMPIN <=0.5 SENSITIVE Sensitive     Inducible Clindamycin NEGATIVE Sensitive     * RARE STAPHYLOCOCCUS AUREUS  Aerobic/Anaerobic Culture (surgical/deep wound)     Status: None   Collection Time: 12/18/19  3:08 PM   Specimen: Other Source; Tissue  Result Value Ref Range Status   Specimen Description BONE LEFT FOOT  Final   Special Requests PT ON ZOSYN VANC  Final   Gram Stain   Final    RARE WBC PRESENT, PREDOMINANTLY MONONUCLEAR FEW GRAM POSITIVE COCCI    Culture   Final    RARE STAPHYLOCOCCUS AUREUS FEW ENTEROBACTER CLOACAE FEW ENTEROCOCCUS FAECALIS ABUNDANT BACTEROIDES SPECIES BETA LACTAMASE NEGATIVE FEW DIPHTHEROIDS(CORYNEBACTERIUM SPECIES) ORGANISM 6 Standardized susceptibility testing for this organism is not available.    Report Status 12/23/2019 FINAL  Final   Organism ID, Bacteria STAPHYLOCOCCUS AUREUS  Final   Organism ID, Bacteria ENTEROBACTER CLOACAE  Final   Organism ID, Bacteria ENTEROCOCCUS FAECALIS  Final      Susceptibility   Enterobacter cloacae - MIC*    CEFAZOLIN >=64 RESISTANT Resistant     CEFEPIME <=0.12  SENSITIVE Sensitive     CEFTAZIDIME <=1 SENSITIVE Sensitive     CIPROFLOXACIN <=0.25 SENSITIVE Sensitive     GENTAMICIN <=1 SENSITIVE  Sensitive     IMIPENEM 0.5 SENSITIVE Sensitive     TRIMETH/SULFA <=20 SENSITIVE Sensitive     PIP/TAZO <=4 SENSITIVE Sensitive     * FEW ENTEROBACTER CLOACAE   Enterococcus faecalis - MIC*    AMPICILLIN <=2 SENSITIVE Sensitive     VANCOMYCIN 1 SENSITIVE Sensitive     GENTAMICIN SYNERGY SENSITIVE Sensitive     * FEW ENTEROCOCCUS FAECALIS   Staphylococcus aureus - MIC*    CIPROFLOXACIN <=0.5 SENSITIVE Sensitive     ERYTHROMYCIN <=0.25 SENSITIVE Sensitive     GENTAMICIN <=0.5 SENSITIVE Sensitive     OXACILLIN 0.5 SENSITIVE Sensitive     TETRACYCLINE <=1 SENSITIVE Sensitive     VANCOMYCIN 1 SENSITIVE Sensitive     TRIMETH/SULFA <=10 SENSITIVE Sensitive     CLINDAMYCIN <=0.25 SENSITIVE Sensitive     RIFAMPIN <=0.5 SENSITIVE Sensitive     Inducible Clindamycin NEGATIVE Sensitive     * RARE STAPHYLOCOCCUS AUREUS  SARS CORONAVIRUS 2 (TAT 6-24 HRS) Nasopharyngeal Nasopharyngeal Swab     Status: None   Collection Time: 12/26/19 11:57 AM   Specimen: Nasopharyngeal Swab  Result Value Ref Range Status   SARS Coronavirus 2 NEGATIVE NEGATIVE Final    Comment: (NOTE) SARS-CoV-2 target nucleic acids are NOT DETECTED. The SARS-CoV-2 RNA is generally detectable in upper and lower respiratory specimens during the acute phase of infection. Negative results do not preclude SARS-CoV-2 infection, do not rule out co-infections with other pathogens, and should not be used as the sole basis for treatment or other patient management decisions. Negative results must be combined with clinical observations, patient history, and epidemiological information. The expected result is Negative. Fact Sheet for Patients: SugarRoll.be Fact Sheet for Healthcare Providers: https://www.woods-mathews.com/ This test is not yet approved or cleared by the [place] FDA and  has been authorized for detection and/or diagnosis of SARS-CoV-2 by FDA under an Emergency Use  Authorization (EUA). This EUA will remain  in effect (meaning this test can be used) for the duration of the COVID-19 declaration under Section 56 4(b)(1) of the Act, 21 U.S.C. section 360bbb-3(b)(1), unless the authorization is terminated or revoked sooner. Performed at [facility], [place] [address], [place], [place] [address]      Labs: BNP (last 3 results) Recent Labs    07/15/19 0424 07/16/19 0224 07/17/19 0315  BNP 130.1* 77.2 914.7*   Basic Metabolic Panel: Recent Labs  Lab 12/22/19 0403 12/23/19 0445 12/24/19 0314 12/25/19 0255 12/26/19 0315  NA 137 135 130* 136 131*  K 4.0 3.9 4.1 4.1 4.2  CL 100 99 97* 102 101  CO2 _0 GLUCOSE 175* 188* 187* 165* 181*  BUN _1 CREATININE 0.97 1.13 0.99 1.06 1.11  CALCIUM 8.9 9.1 8.8* 8.9 8.9   Liver Function Tests: No results for input(s): AST, ALT, ALKPHOS, BILITOT, PROT, ALBUMIN in the last 168 hours. No results for input(s): LIPASE, AMYLASE in the last 168 hours. No results for input(s): AMMONIA in the last 168 hours. CBC: Recent Labs  Lab 12/22/19 0403 12/23/19 0445 12/24/19 0314 12/25/19 0255 12/26/19 0315  WBC 6.9 6.8 5.1 4.3 5.1  NEUTROABS 4.7 4.4 2.5 2.1 2.7  HGB 9.1* 8.6* 8.3* 8.1* 8.0*  HCT 28.1* 27.0* 26.1* 25.1* 25.0*  MCV 90.6 92.2 91.3 90.3 92.3  PLT  208 180 176 182 193   Cardiac Enzymes: No results for input(s): CKTOTAL, CKMB, CKMBINDEX, TROPONINI in the last 168 hours. BNP: Invalid input(s): POCBNP CBG: Recent Labs  Lab 12/26/19 0635 12/26/19 1114 12/26/19 1627 12/26/19 2107 12/27/19 0634  GLUCAP 141* 180* 176* 185* 111*   D-Dimer No results for input(s): DDIMER in the last 72 hours. Hgb A1c No results for input(s): HGBA1C in the last 72 hours. Lipid Profile No results for input(s): CHOL, HDL, LDLCALC, TRIG, CHOLHDL, LDLDIRECT in the last 72 hours. Thyroid function studies No results for input(s): TSH, T4TOTAL, T3FREE, THYROIDAB in the last 72  hours.  Invalid input(s): FREET3 Anemia work up No results for input(s): VITAMINB12, FOLATE, FERRITIN, TIBC, IRON, RETICCTPCT in the last 72 hours. Urinalysis    Component Value Date/Time   COLORURINE YELLOW 08/14/2019 [place] 08/14/2019 1232   LABSPEC 1.020 08/14/2019 1232   PHURINE 5.0 08/14/2019 1232   GLUCOSEU NEGATIVE 08/14/2019 1232   HGBUR NEGATIVE 08/14/2019 1232   BILIRUBINUR NEGATIVE 08/14/2019 1232   KETONESUR NEGATIVE 08/14/2019 1232   PROTEINUR NEGATIVE 08/14/2019 1232   UROBILINOGEN 4.0 (H) 10/09/2008 1114   NITRITE NEGATIVE 08/14/2019 1232   LEUKOCYTESUR NEGATIVE 08/14/2019 1232   Sepsis Labs Invalid input(s): PROCALCITONIN,  WBC,  LACTICIDVEN Microbiology Recent Results (from the past 240 hour(s))  Culture, blood (routine x 2)     Status: None   Collection Time: 12/17/19  6:22 PM   Specimen: BLOOD RIGHT FOREARM  Result Value Ref Range Status   Specimen Description BLOOD RIGHT FOREARM  Final   Special Requests   Final    BOTTLES DRAWN AEROBIC ONLY Blood Culture adequate volume   Culture   Final    NO GROWTH 5 DAYS Performed at [facility], [address] [place], [place] [address]    Report Status 12/22/2019 FINAL  Final  Culture, blood (routine x 2)     Status: None   Collection Time: 12/17/19  6:22 PM   Specimen: Right Antecubital; Blood  Result Value Ref Range Status   Specimen Description RIGHT ANTECUBITAL  Final   Special Requests   Final    BOTTLES DRAWN AEROBIC AND ANAEROBIC Blood Culture adequate volume   Culture   Final    NO GROWTH 5 DAYS Performed at [facility], [address], [place], [place] [address]    Report Status 12/22/2019 FINAL  Final  Respiratory Panel by RT PCR (Flu A&B, Covid) - Nasopharyngeal Swab     Status: None   Collection Time: 12/17/19  7:42 PM   Specimen: Nasopharyngeal Swab  Result Value Ref Range Status   SARS Coronavirus 2 by RT PCR NEGATIVE NEGATIVE Final    Comment: (NOTE) SARS-CoV-2 target  nucleic acids are NOT DETECTED. The SARS-CoV-2 RNA is generally detectable in upper respiratoy specimens during the acute phase of infection. The lowest concentration of SARS-CoV-2 viral copies this assay can detect is 131 copies/mL. A negative result does not preclude SARS-Cov-2 infection and should not be used as the sole basis for treatment or other patient management decisions. A negative result may occur with  improper specimen collection/handling, submission of specimen other than nasopharyngeal swab, presence of viral mutation(s) within the areas targeted by this assay, and inadequate number of viral copies (<131 copies/mL). A negative result must be combined with clinical observations, patient history, and epidemiological information. The expected result is Negative. Fact Sheet for Patients:  PinkCheek.be Fact Sheet for Healthcare Providers:  GravelBags.it This test is not yet  ap proved or cleared by the [place] and  has been authorized for detection and/or diagnosis of SARS-CoV-2 by FDA under an Emergency Use Authorization (EUA). This EUA will remain  in effect (meaning this test can be used) for the duration of the COVID-19 declaration under Section 564(b)(1) of the Act, 21 U.S.C. section 360bbb-3(b)(1), unless the authorization is terminated or revoked sooner.    Influenza A by PCR NEGATIVE NEGATIVE Final   Influenza B by PCR NEGATIVE NEGATIVE Final    Comment: (NOTE) The Xpert Xpress SARS-CoV-2/FLU/RSV assay is intended as an aid in  the diagnosis of influenza from Nasopharyngeal swab specimens and  should not be used as a sole basis for treatment. Nasal washings and  aspirates are unacceptable for Xpert Xpress SARS-CoV-2/FLU/RSV  testing. Fact Sheet for Patients: PinkCheek.be Fact Sheet for Healthcare Providers: GravelBags.it This test is not  yet approved or cleared by the [place] FDA and  has been authorized for detection and/or diagnosis of SARS-CoV-2 by  FDA under an Emergency Use Authorization (EUA). This EUA will remain  in effect (meaning this test can be used) for the duration of the  Covid-19 declaration under Section 564(b)(1) of the Act, 21  U.S.C. section 360bbb-3(b)(1), unless the authorization is  terminated or revoked. Performed at [facility], [address] [place], [place] [address]   Aerobic/Anaerobic Culture (surgical/deep wound)     Status: None   Collection Time: 12/18/19  2:56 PM   Specimen: PATH Other; Tissue  Result Value Ref Range Status   Specimen Description WOUND LEFT FOOT  Final   Special Requests PT ON ZOSYN VANC  Final   Gram Stain   Final    RARE WBC PRESENT, PREDOMINANTLY PMN ABUNDANT GRAM POSITIVE COCCI ABUNDANT GRAM NEGATIVE RODS    Culture   Final    FEW ENTEROBACTER CLOACAE RARE STAPHYLOCOCCUS AUREUS FEW ENTEROCOCCUS FAECALIS FEW BACTEROIDES SPECIES BETA LACTAMASE NEGATIVE Performed at [facility], [place] [address] [place], [place] [address]    Report Status 12/22/2019 FINAL  Final   Organism ID, Bacteria ENTEROBACTER CLOACAE  Final   Organism ID, Bacteria ENTEROCOCCUS FAECALIS  Final   Organism ID, Bacteria STAPHYLOCOCCUS AUREUS  Final      Susceptibility   Enterobacter cloacae - MIC*    CEFAZOLIN >=64 RESISTANT Resistant     CEFEPIME <=0.12 SENSITIVE Sensitive     CEFTAZIDIME <=1 SENSITIVE Sensitive     CIPROFLOXACIN <=0.25 SENSITIVE Sensitive     GENTAMICIN <=1 SENSITIVE Sensitive     IMIPENEM 1 SENSITIVE Sensitive     TRIMETH/SULFA <=20 SENSITIVE Sensitive     PIP/TAZO <=4 SENSITIVE Sensitive     * FEW ENTEROBACTER CLOACAE   Enterococcus faecalis - MIC*    AMPICILLIN <=2 SENSITIVE Sensitive     VANCOMYCIN 1 SENSITIVE Sensitive     GENTAMICIN SYNERGY SENSITIVE Sensitive     * FEW ENTEROCOCCUS FAECALIS   Staphylococcus aureus - MIC*    CIPROFLOXACIN <=0.5  SENSITIVE Sensitive     ERYTHROMYCIN <=0.25 SENSITIVE Sensitive     GENTAMICIN <=0.5 SENSITIVE Sensitive     OXACILLIN 0.5 SENSITIVE Sensitive     TETRACYCLINE <=1 SENSITIVE Sensitive     VANCOMYCIN 1 SENSITIVE Sensitive     TRIMETH/SULFA <=10 SENSITIVE Sensitive     CLINDAMYCIN <=0.25 SENSITIVE Sensitive     RIFAMPIN <=0.5 SENSITIVE Sensitive     Inducible Clindamycin NEGATIVE Sensitive     * RARE STAPHYLOCOCCUS AUREUS  Aerobic/Anaerobic Culture (surgical/deep wound)  Status: None   Collection Time: 12/18/19  3:08 PM   Specimen: Other Source; Tissue  Result Value Ref Range Status   Specimen Description BONE LEFT FOOT  Final   Special Requests PT ON ZOSYN VANC  Final   Gram Stain   Final    RARE WBC PRESENT, PREDOMINANTLY MONONUCLEAR FEW GRAM POSITIVE COCCI    Culture   Final    RARE STAPHYLOCOCCUS AUREUS FEW ENTEROBACTER CLOACAE FEW ENTEROCOCCUS FAECALIS ABUNDANT BACTEROIDES SPECIES BETA LACTAMASE NEGATIVE FEW DIPHTHEROIDS(CORYNEBACTERIUM SPECIES) ORGANISM 6 Standardized susceptibility testing for this organism is not available.    Report Status 12/23/2019 FINAL  Final   Organism ID, Bacteria STAPHYLOCOCCUS AUREUS  Final   Organism ID, Bacteria ENTEROBACTER CLOACAE  Final   Organism ID, Bacteria ENTEROCOCCUS FAECALIS  Final      Susceptibility   Enterobacter cloacae - MIC*    CEFAZOLIN >=64 RESISTANT Resistant     CEFEPIME <=0.12 SENSITIVE Sensitive     CEFTAZIDIME <=1 SENSITIVE Sensitive     CIPROFLOXACIN <=0.25 SENSITIVE Sensitive     GENTAMICIN <=1 SENSITIVE Sensitive     IMIPENEM 0.5 SENSITIVE Sensitive     TRIMETH/SULFA <=20 SENSITIVE Sensitive     PIP/TAZO <=4 SENSITIVE Sensitive     * FEW ENTEROBACTER CLOACAE   Enterococcus faecalis - MIC*    AMPICILLIN <=2 SENSITIVE Sensitive     VANCOMYCIN 1 SENSITIVE Sensitive     GENTAMICIN SYNERGY SENSITIVE Sensitive     * FEW ENTEROCOCCUS FAECALIS   Staphylococcus aureus - MIC*    CIPROFLOXACIN <=0.5 SENSITIVE  Sensitive     ERYTHROMYCIN <=0.25 SENSITIVE Sensitive     GENTAMICIN <=0.5 SENSITIVE Sensitive     OXACILLIN 0.5 SENSITIVE Sensitive     TETRACYCLINE <=1 SENSITIVE Sensitive     VANCOMYCIN 1 SENSITIVE Sensitive     TRIMETH/SULFA <=10 SENSITIVE Sensitive     CLINDAMYCIN <=0.25 SENSITIVE Sensitive     RIFAMPIN <=0.5 SENSITIVE Sensitive     Inducible Clindamycin NEGATIVE Sensitive     * RARE STAPHYLOCOCCUS AUREUS  SARS CORONAVIRUS 2 (TAT 6-24 HRS) Nasopharyngeal Nasopharyngeal Swab     Status: None   Collection Time: 12/26/19 11:57 AM   Specimen: Nasopharyngeal Swab  Result Value Ref Range Status   SARS Coronavirus 2 NEGATIVE NEGATIVE Final    Comment: (NOTE) SARS-CoV-2 target nucleic acids are NOT DETECTED. The SARS-CoV-2 RNA is generally detectable in upper and lower respiratory specimens during the acute phase of infection. Negative results do not preclude SARS-CoV-2 infection, do not rule out co-infections with other pathogens, and should not be used as the sole basis for treatment or other patient management decisions. Negative results must be combined with clinical observations, patient history, and epidemiological information. The expected result is Negative. Fact Sheet for Patients: SugarRoll.be Fact Sheet for Healthcare Providers: https://www.woods-mathews.com/ This test is not yet approved or cleared by the [place] FDA and  has been authorized for detection and/or diagnosis of SARS-CoV-2 by FDA under an Emergency Use Authorization (EUA). This EUA will remain  in effect (meaning this test can be used) for the duration of the COVID-19 declaration under Section 56 4(b)(1) of the Act, 21 U.S.C. section 360bbb-3(b)(1), unless the authorization is terminated or revoked sooner. Performed at [facility], [place] [address], [place], [place] [address]      Time coordinating discharge: 45 minutes  SIGNED:   Tawni Millers, MD  [facility] 12/27/2019, 10:55 AM

## 2019-12-27 NOTE — Progress Notes (Signed)
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 He is now s/p I&D on 12/18/19. Planning for Charcot amputation on 12/21/19 per podiatry note 12/19/19. PMH including but not limited to DM and HTN. To OR 2/20 for for conversion to [place] amputation, NWB   OT comments  Pt supine in bed upon entering the room and reports and increase in phantom leg pain. OT educating pt on pain management technique of rubbing and tapping residual limb for input with pt adjusting self in bed and returning demonstrations. OT provided pt with green level three theraband for B UE strengthening exercises. OT demonstrated and pt returned with min cuing for proper technique for chest pulls, shoulder diagonals, shoulder flexion, and bicep curls but pt was unable to remain awake during this session. Pt's eyes closing and therapist having to arouse for participation with therapeutic exercise. Pt verbalized, " I'm sorry I just didn't sleep well". Theraband left with pt for him to use throughout the day. Pt continues to benefit from OT intervention.   Follow Up Recommendations  CIR    Equipment Recommendations  3 in 1 bedside commode       Precautions / Restrictions Precautions Precautions: Fall Restrictions LLE Weight Bearing: Non weight bearing Other Position/Activity Restrictions: OK to bear weight through L knee for knee scooter use per conversation with [name]              ADL either performed or assessed with clinical judgement        Vision Patient Visual Report: No change from baseline            Cognition Arousal/Alertness: Lethargic Behavior During Therapy: [facility] for tasks assessed/performed Overall Cognitive Status: Within Functional Limits for tasks assessed                     Pertinent Vitals/ Pain       Pain Assessment: 0-10 Pain Score: 8  Pain Location: L foot Pain Descriptors / Indicators: Aching Pain Intervention(s): Limited activity within patient's tolerance;Monitored during session         Frequency  Min 2X/week        Progress Toward Goals  OT Goals(current goals can now be found in the care plan section)  Progress towards OT goals: Progressing toward goals  Acute Rehab OT Goals Patient Stated Goal: to get stronger OT Goal Formulation: With patient Time For Goal Achievement: 01/10/20 Potential to Achieve Goals: Good  Plan Discharge plan remains appropriate       AM-PAC OT "6 Clicks" Daily Activity     Outcome Measure   Help from another person eating meals?: None Help from another person taking care of personal grooming?: A Little Help from another person toileting, which includes using toliet, bedpan, or urinal?: A Little Help from another person bathing (including washing, rinsing, drying)?: A Little Help from another person to put on and taking off regular upper body clothing?: A Little Help from another person to put on and taking off regular lower body clothing?: A Lot 6 Click Score: 18    End of Session    OT Visit Diagnosis: Unsteadiness on feet (R26.81);Other abnormalities of gait and mobility (R26.89);Pain Pain - Right/Left: Left Pain - part of body: Ankle and joints of foot   Activity Tolerance Patient limited  by fatigue   Patient Left in bed;with call bell/phone within reach;with bed alarm set   Nurse Communication          Time: [id number] OT Time Calculation (min): 19 min  Charges: OT General Charges $OT Visit: 1 Visit OT Treatments $Therapeutic Activity: 8-22 mins  Gypsy Decant MS, OTR/L 12/27/2019, 1:34 PM

## 2019-12-27 NOTE — Progress Notes (Signed)
Peripherally Inserted Central Catheter/Midline Placement  The IV Nurse has discussed with the patient and/or persons authorized to consent for the patient, the purpose of this procedure and the potential benefits and risks involved with this procedure.  The benefits include less needle sticks, lab draws from the catheter, and the patient may be discharged home with the catheter. Risks include, but not limited to, infection, bleeding, blood clot (thrombus formation), and puncture of an artery; nerve damage and irregular heartbeat and possibility to perform a PICC exchange if needed/ordered by physician.  Alternatives to this procedure were also discussed.  Bard Power PICC patient education guide, fact sheet on infection prevention and patient information card has been provided to patient /or left at bedside.    PICC/Midline Placement Documentation  PICC Single Lumen [id number] PICC Right Basilic 47 cm 0 cm (Active)  Indication for Insertion or Continuance of Line Home intravenous therapies (PICC only) 12/27/19 0800  Exposed Catheter (cm) 0 cm 12/27/19 0800  Site Assessment Clean;Dry;Intact 12/27/19 0800  Line Status Flushed;Blood return noted 12/27/19 0800  Dressing Type Transparent 12/27/19 0800  Dressing Status Clean;Dry;Intact;Antimicrobial disc in place 12/27/19 0800  Dressing Change Due 01/03/20 12/27/19 0800       Jule Economy Horton 12/27/2019, 8:55 AM

## 2019-12-27 NOTE — Consult Note (Addendum)
[facility] Nurse Consult Note: Podiatry team following for assessment and plan of care.  Reason for Consult: Wound vac change to left foot. Wound type: Closed incision with sutures and staples well approximated, with small amount of bloody drainage in the cannister.  No active bleeding, small pink drainage in the center of the sutures. Mepitel placed over sutures and staples area covered with one black foam and a separate black foam placed anterior to the area for bridging of the track pad.  Immediate seal achieved at 129mm cont suction, foot wrapped with ace bandage.    Plan: Incisional Vac to LLE incision line.  Change dressing Q 5 days or to avoid dressing change on the weekend. Apply Mepitel contact layer before applying black foam each time, bridge track pad to anterior LLE, and cover with Ace wrap.  Monitor the wound area's for worsening of condition such as, Signs/symptoms of infection, Increase in size, development of or worsening of odor, development of pain, or increased pain at the affected locations.  Notify the medical team if any of these develop.  Thank you for the consult.  Discussed plan of care with the patient.  WOC will continue to follow while in the hospital for Q 5 day changes.  Julien Girt MSN, RN, [place], [place], [place].

## 2019-12-27 NOTE — Plan of Care (Signed)

## 2019-12-31 ENCOUNTER — Telehealth: Payer: Self-pay | Admitting: *Deleted

## 2019-12-31 NOTE — Telephone Encounter (Signed)
Unable to leave a message the mailbox is full. I will have a scheduler contact pt.

## 2019-12-31 NOTE — Telephone Encounter (Signed)
Pt states he is still in the hospital and needs to get an appt with [name].

## 2020-01-01 ENCOUNTER — Encounter: Payer: Self-pay | Admitting: Podiatry

## 2020-01-01 ENCOUNTER — Telehealth: Payer: Self-pay | Admitting: Podiatry

## 2020-01-01 NOTE — Telephone Encounter (Signed)
Called and spoke with patient. Advised to call for follow up as soon as he gets back to facility. He was at a Rehab but was back to the hospital because he hit his foot

## 2020-01-01 NOTE — Telephone Encounter (Signed)
Called and spoke to Patient's sister to get update on interval events since d/c.

## 2020-01-01 NOTE — Telephone Encounter (Signed)
Pt called called and stated that he was still in hospital and he did not know when he was coming out of the hospital. He stated that they were talking of sending him to rehabhe stated he just did not know when he would be out of hospital or rehab.. he called 01/01/20

## 2020-01-01 NOTE — Telephone Encounter (Signed)
Called pt 01/01/20 and was not able to leave message either due to vm full

## 2020-01-02 ENCOUNTER — Telehealth: Payer: Self-pay | Admitting: *Deleted

## 2020-01-02 ENCOUNTER — Telehealth: Payer: Self-pay | Admitting: Podiatry

## 2020-01-02 ENCOUNTER — Ambulatory Visit: Payer: Medicaid Other | Admitting: Podiatry

## 2020-01-02 NOTE — Telephone Encounter (Signed)
Thanks I spoke to them on the phone

## 2020-01-02 NOTE — Telephone Encounter (Signed)
Pt's str, Eugene Diaz states pt is back at [facility] and she would like him to go to [facility] but his insurance won't cover, pt has been back in the hospital 3 times since being at [facility] and she would like to know if there is anyway to get the insurance to cover [facility].

## 2020-01-02 NOTE — Telephone Encounter (Signed)
Draedyn Burchams sister has called and she is asking for some type of advice to get her brother back to [place] shes really concerened about Mr.Mcghie and really just wants some advice or help .please call Mr .edelman sister [name] [phone]

## 2020-01-02 NOTE — Telephone Encounter (Signed)
I spoke with C. Crystal, scheduler and she states pt is in the hospital and they have rescheduled pt for tomorrow with Dr. [name].

## 2020-01-03 ENCOUNTER — Other Ambulatory Visit: Payer: Self-pay

## 2020-01-03 ENCOUNTER — Telehealth: Payer: Self-pay | Admitting: *Deleted

## 2020-01-03 ENCOUNTER — Ambulatory Visit (INDEPENDENT_AMBULATORY_CARE_PROVIDER_SITE_OTHER): Payer: Medicaid Other | Admitting: Podiatry

## 2020-01-03 DIAGNOSIS — Z91199 Patient's noncompliance with other medical treatment and regimen due to unspecified reason: Secondary | ICD-10-CM

## 2020-01-03 DIAGNOSIS — T8130XA Disruption of wound, unspecified, initial encounter: Secondary | ICD-10-CM

## 2020-01-03 DIAGNOSIS — E1142 Type 2 diabetes mellitus with diabetic polyneuropathy: Secondary | ICD-10-CM

## 2020-01-03 DIAGNOSIS — Z9119 Patient's noncompliance with other medical treatment and regimen: Secondary | ICD-10-CM

## 2020-01-03 DIAGNOSIS — Z9889 Other specified postprocedural states: Secondary | ICD-10-CM

## 2020-01-03 NOTE — Telephone Encounter (Signed)
Pt states he wants Dr. [name] to get him out of the nursing home. I told pt that I would send the message to Dr. [name], but I did not think he could discharge him if he had not been the admitting doctor.

## 2020-01-03 NOTE — Telephone Encounter (Signed)
Pt's str, Eugene Diaz states pt is scheduled to be seen today and will be at the appt, she had discussed casting pt to decrease pt's continued injuring of the foot. Eugene Diaz would like to know if there is another option to [facility] therapy, it is noisy and agitates pt.

## 2020-01-04 ENCOUNTER — Inpatient Hospital Stay (HOSPITAL_COMMUNITY)
Admission: AD | Admit: 2020-01-04 | Discharge: 2020-01-07 | DRG: 638 | Disposition: A | Discharge status: Skilled Nursing Facility | Payer: Medicaid Other | Source: Other Acute Inpatient Hospital | Attending: Internal Medicine | Admitting: Internal Medicine

## 2020-01-04 ENCOUNTER — Inpatient Hospital Stay (HOSPITAL_COMMUNITY): Payer: Medicaid Other

## 2020-01-04 DIAGNOSIS — G250 Essential tremor: Secondary | ICD-10-CM | POA: Diagnosis present

## 2020-01-04 DIAGNOSIS — E1151 Type 2 diabetes mellitus with diabetic peripheral angiopathy without gangrene: Secondary | ICD-10-CM | POA: Diagnosis present

## 2020-01-04 DIAGNOSIS — Z87891 Personal history of nicotine dependence: Secondary | ICD-10-CM | POA: Diagnosis not present

## 2020-01-04 DIAGNOSIS — Z79891 Long term (current) use of opiate analgesic: Secondary | ICD-10-CM | POA: Diagnosis not present

## 2020-01-04 DIAGNOSIS — E1169 Type 2 diabetes mellitus with other specified complication: Secondary | ICD-10-CM | POA: Diagnosis present

## 2020-01-04 DIAGNOSIS — F418 Other specified anxiety disorders: Secondary | ICD-10-CM

## 2020-01-04 DIAGNOSIS — E1140 Type 2 diabetes mellitus with diabetic neuropathy, unspecified: Secondary | ICD-10-CM | POA: Diagnosis present

## 2020-01-04 DIAGNOSIS — E7800 Pure hypercholesterolemia, unspecified: Secondary | ICD-10-CM | POA: Diagnosis present

## 2020-01-04 DIAGNOSIS — Z6834 Body mass index (BMI) 34.0-34.9, adult: Secondary | ICD-10-CM

## 2020-01-04 DIAGNOSIS — Z86010 Personal history of colonic polyps: Secondary | ICD-10-CM | POA: Diagnosis not present

## 2020-01-04 DIAGNOSIS — Z89432 Acquired absence of left foot: Secondary | ICD-10-CM | POA: Diagnosis not present

## 2020-01-04 DIAGNOSIS — E785 Hyperlipidemia, unspecified: Secondary | ICD-10-CM | POA: Diagnosis present

## 2020-01-04 DIAGNOSIS — K219 Gastro-esophageal reflux disease without esophagitis: Secondary | ICD-10-CM | POA: Diagnosis present

## 2020-01-04 DIAGNOSIS — T8781 Dehiscence of amputation stump: Secondary | ICD-10-CM | POA: Diagnosis present

## 2020-01-04 DIAGNOSIS — M869 Osteomyelitis, unspecified: Secondary | ICD-10-CM | POA: Diagnosis present

## 2020-01-04 DIAGNOSIS — I10 Essential (primary) hypertension: Secondary | ICD-10-CM | POA: Diagnosis present

## 2020-01-04 DIAGNOSIS — E1159 Type 2 diabetes mellitus with other circulatory complications: Secondary | ICD-10-CM | POA: Diagnosis present

## 2020-01-04 DIAGNOSIS — E1142 Type 2 diabetes mellitus with diabetic polyneuropathy: Secondary | ICD-10-CM | POA: Diagnosis present

## 2020-01-04 DIAGNOSIS — J45909 Unspecified asthma, uncomplicated: Secondary | ICD-10-CM

## 2020-01-04 DIAGNOSIS — Z20822 Contact with and (suspected) exposure to covid-19: Secondary | ICD-10-CM | POA: Diagnosis present

## 2020-01-04 DIAGNOSIS — M86172 Other acute osteomyelitis, left ankle and foot: Secondary | ICD-10-CM | POA: Diagnosis present

## 2020-01-04 DIAGNOSIS — Z79899 Other long term (current) drug therapy: Secondary | ICD-10-CM

## 2020-01-04 DIAGNOSIS — E669 Obesity, unspecified: Secondary | ICD-10-CM | POA: Diagnosis present

## 2020-01-04 DIAGNOSIS — D62 Acute posthemorrhagic anemia: Secondary | ICD-10-CM | POA: Diagnosis present

## 2020-01-04 DIAGNOSIS — Z7984 Long term (current) use of oral hypoglycemic drugs: Secondary | ICD-10-CM

## 2020-01-04 DIAGNOSIS — M1990 Unspecified osteoarthritis, unspecified site: Secondary | ICD-10-CM | POA: Diagnosis present

## 2020-01-04 DIAGNOSIS — Z9119 Patient's noncompliance with other medical treatment and regimen: Secondary | ICD-10-CM

## 2020-01-04 DIAGNOSIS — E11621 Type 2 diabetes mellitus with foot ulcer: Secondary | ICD-10-CM | POA: Diagnosis not present

## 2020-01-04 DIAGNOSIS — L089 Local infection of the skin and subcutaneous tissue, unspecified: Secondary | ICD-10-CM

## 2020-01-04 DIAGNOSIS — Z8249 Family history of ischemic heart disease and other diseases of the circulatory system: Secondary | ICD-10-CM

## 2020-01-04 DIAGNOSIS — I152 Hypertension secondary to endocrine disorders: Secondary | ICD-10-CM | POA: Diagnosis present

## 2020-01-04 DIAGNOSIS — L97509 Non-pressure chronic ulcer of other part of unspecified foot with unspecified severity: Secondary | ICD-10-CM | POA: Diagnosis not present

## 2020-01-04 DIAGNOSIS — Y838 Other surgical procedures as the cause of abnormal reaction of the patient, or of later complication, without mention of misadventure at the time of the procedure: Secondary | ICD-10-CM | POA: Diagnosis present

## 2020-01-04 LAB — GLUCOSE, CAPILLARY: Glucose-Capillary: 135 mg/dL — ABNORMAL HIGH (ref 70–99)

## 2020-01-04 MED ORDER — ALPRAZOLAM 0.5 MG PO TABS
1.0000 mg | ORAL_TABLET | Freq: Three times a day (TID) | ORAL | Status: DC | PRN
  Administered 2020-01-05 – 2020-01-06 (×3): 1 mg via ORAL
  Filled 2020-01-04 (×4): qty 2

## 2020-01-04 MED ORDER — GABAPENTIN 400 MG PO CAPS
1200.0000 mg | ORAL_CAPSULE | Freq: Every day | ORAL | Status: DC
  Administered 2020-01-04 – 2020-01-06 (×3): 1200 mg via ORAL
  Filled 2020-01-04 (×3): qty 3

## 2020-01-04 MED ORDER — PANTOPRAZOLE SODIUM 40 MG PO TBEC
40.0000 mg | DELAYED_RELEASE_TABLET | Freq: Every day | ORAL | Status: DC
  Administered 2020-01-04 – 2020-01-07 (×4): 40 mg via ORAL
  Filled 2020-01-04 (×4): qty 1

## 2020-01-04 MED ORDER — ACETAMINOPHEN 325 MG PO TABS: 650.0000 mg | ORAL_TABLET | Freq: Four times a day (QID) | ORAL | Status: DC | PRN

## 2020-01-04 MED ORDER — INSULIN ASPART 100 UNIT/ML ~~LOC~~ SOLN
0.0000 [IU] | Freq: Three times a day (TID) | SUBCUTANEOUS | Status: DC
  Administered 2020-01-05 (×3): 2 [IU] via SUBCUTANEOUS
  Administered 2020-01-06: 3 [IU] via SUBCUTANEOUS
  Administered 2020-01-06: 2 [IU] via SUBCUTANEOUS
  Administered 2020-01-06: 1 [IU] via SUBCUTANEOUS
  Administered 2020-01-07: 2 [IU] via SUBCUTANEOUS

## 2020-01-04 MED ORDER — ALBUTEROL SULFATE (2.5 MG/3ML) 0.083% IN NEBU: 3.0000 mL | INHALATION_SOLUTION | Freq: Four times a day (QID) | RESPIRATORY_TRACT | Status: DC | PRN

## 2020-01-04 MED ORDER — LISINOPRIL 20 MG PO TABS
20.0000 mg | ORAL_TABLET | Freq: Every day | ORAL | Status: DC
  Administered 2020-01-05 – 2020-01-07 (×3): 20 mg via ORAL
  Filled 2020-01-04 (×3): qty 1

## 2020-01-04 MED ORDER — HYDROXYZINE HCL 25 MG PO TABS: 25.0000 mg | ORAL_TABLET | Freq: Four times a day (QID) | ORAL | Status: DC | PRN

## 2020-01-04 MED ORDER — ONDANSETRON HCL 4 MG/2ML IJ SOLN: 4.0000 mg | Freq: Four times a day (QID) | INTRAMUSCULAR | Status: DC | PRN

## 2020-01-04 MED ORDER — ONDANSETRON HCL 4 MG PO TABS: 4.0000 mg | ORAL_TABLET | Freq: Four times a day (QID) | ORAL | Status: DC | PRN

## 2020-01-04 MED ORDER — PRIMIDONE 50 MG PO TABS
50.0000 mg | ORAL_TABLET | Freq: Two times a day (BID) | ORAL | Status: DC
  Administered 2020-01-04 – 2020-01-07 (×6): 50 mg via ORAL
  Filled 2020-01-04 (×6): qty 1

## 2020-01-04 MED ORDER — DULOXETINE HCL 60 MG PO CPEP
60.0000 mg | ORAL_CAPSULE | Freq: Every day | ORAL | Status: DC
  Administered 2020-01-05 – 2020-01-07 (×3): 60 mg via ORAL
  Filled 2020-01-04 (×3): qty 1

## 2020-01-04 MED ORDER — PIPERACILLIN-TAZOBACTAM 3.375 G IVPB
3.3750 g | Freq: Three times a day (TID) | INTRAVENOUS | Status: DC
  Administered 2020-01-04 – 2020-01-07 (×7): 3.375 g via INTRAVENOUS
  Filled 2020-01-04 (×6): qty 50

## 2020-01-04 MED ORDER — OXYCODONE HCL 5 MG PO TABS
10.0000 mg | ORAL_TABLET | Freq: Four times a day (QID) | ORAL | Status: DC | PRN
  Administered 2020-01-05 – 2020-01-07 (×4): 10 mg via ORAL
  Filled 2020-01-04 (×4): qty 2

## 2020-01-04 MED ORDER — AMITRIPTYLINE HCL 50 MG PO TABS
100.0000 mg | ORAL_TABLET | Freq: Every day | ORAL | Status: DC
  Administered 2020-01-04 – 2020-01-06 (×3): 100 mg via ORAL
  Filled 2020-01-04 (×3): qty 2

## 2020-01-04 MED ORDER — ATORVASTATIN CALCIUM 40 MG PO TABS
40.0000 mg | ORAL_TABLET | Freq: Every evening | ORAL | Status: DC
  Administered 2020-01-04 – 2020-01-06 (×3): 40 mg via ORAL
  Filled 2020-01-04 (×3): qty 1

## 2020-01-04 MED ORDER — GABAPENTIN 300 MG PO CAPS
900.0000 mg | ORAL_CAPSULE | ORAL | Status: DC
  Administered 2020-01-05 – 2020-01-07 (×5): 900 mg via ORAL
  Filled 2020-01-04 (×5): qty 3

## 2020-01-04 MED ORDER — ACETAMINOPHEN 650 MG RE SUPP: 650.0000 mg | Freq: Four times a day (QID) | RECTAL | Status: DC | PRN

## 2020-01-04 NOTE — H&P (Signed)
History and Physical    Eugene Diaz OIN:[id number] DOB: [birth year] DOA: 01/04/2020  PCP: [name], PA-C  Patient coming from: [place]  I have personally briefly reviewed patient's old medical records in [place]  Chief Complaint: Anemia  HPI: Eugene Diaz is a 54 y.o. 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  History is supplemented from chart review and outside hospital records.  Patient was last admitted at [facility] from 12/17/2019-12/27/2019 for left foot osteomyelitis.  He was seen by podiatry and underwent debridement on 12/18/2019 and then Chopart amputation on 12/21/2019.  Wound cultures grew MSSA, E faecalis, and Enterobacter Cloacae.  ID recommended continuing 4 weeks of IV Zosyn through 01/20/2020.  Patient was discharged to SNF, [facility].  He presented to [facility] on 12/28/2019 for bleeding from his surgical site.  Hemoglobin was 6.6 at that time and he was transfused PRBC and discharged back to SNF.  He returned again on 12/29/2019 with bleeding from surgical site with hemoglobin 6.2.  He was admitted and transfused 2 units PRBCs and discharged on 01/01/2020 with a hemoglobin of 8.1.  He returned on 01/02/2020, again with bleeding from the surgical site.  Hemoglobin was 7.8.  Surgical consult was obtained and recommendation was nonweightbearing on the left lower extremity and follow-up with his primary surgeon.  He was discharged from the ED and again returned the following day, 01/03/2020.  Patient refused to return to the [facility] and he left the ED [place].  He again returned to the [facility] ED earlier today, 01/04/2020, with further bleeding from his surgical site.  His  hemoglobin is noted to be 5.9.  The ED physician discussed the case with the hospitalist at [facility] who accepted the patient in transfer for further evaluation and management.  Patient reports seeing continued oozing of blood in his left foot surgical site.  He is not having any pain and says he has no sensation in the area.  He has had some lightheadedness without syncope.  He otherwise denies any obvious bleeding including epistaxis, hemoptysis, hematemesis, hematuria, hematochezia, or melena.  He denies any chest pain or dyspnea.  He is not taking any blood thinners or NSAIDs.  Labs at [facility] 01/04/2020: Hemoglobin 5.9, hematocrit 18.7, WBC 8.5, platelets 276,000, sodium 135, potassium 4.0, chloride 101, bicarb 21.1, BUN 11, creatinine 1.08, serum glucose 188, AST 15, ALT 7, alk phos 73, total bilirubin 0.4.  SARS-CoV-2 test was negative as well as rest of the respiratory viral panel.  Review of Systems: All systems reviewed and are negative except as documented in history of present illness above.   Past Medical History:  Diagnosis Date  . Alcohol abuse   . Alcoholic peripheral neuropathy ([place])   . Anemia   . Anxiety   . Arthritis   . Asthma    followed by pcp  . B12 deficiency   . Cellulitis of left lower leg    07-2019 and left foot  . Depression   . Diabetic neuropathy ([place])   . Essential tremor    neurologist-- dr Icholas Irby--- due to alcohol abuse  . GERD (gastroesophageal reflux disease)   . History of esophageal stricture    s/p  dilatation 02-13-2018  . Hypercholesterolemia   .  Hypertension    followed by pcp  (nuclear stress test 03-11-2014 low risk w/ no ischemia, ef 65%  . Osteomyelitis of great toe ([place])    left foot s/p amputation 08-14-2019  . Peripheral vascular disease ([place])   . Thrombocytopenia (HCC)    chronic  . Type 2 diabetes mellitus ([place])    followed by pcp--- last A1c in epic 6.6 on 07-12-2019  . Wound dehiscence, surgical    post left great  toe amputation 08-14-2019    Past Surgical History:  Procedure Laterality Date  . AMPUTATION Left 10/16/2019   Procedure: Left partial second ray resection; placement of antibiotic beads;  Surgeon: Evelina Bucy, DPM;  Location: [facility];  Service: Podiatry;  Laterality: Left;  . AMPUTATION Left 11/13/2019   Procedure: Left Midfoot Amputation - Transmetatarsal vs. Lisfranc; Placement antibiotic beads;  Surgeon: Evelina Bucy, DPM;  [facility]: [facility];  Service: Podiatry;  Laterality: Left;  . AMPUTATION Left 12/21/2019   Procedure: Chopart Amputation left foot;  Surgeon: Evelina Bucy, DPM;  Location: [place];  Service: Podiatry;  Laterality: Left;  . AMPUTATION TOE Left 08/14/2019   Procedure: AMPUTATION GREAT TOE;  Surgeon: Evelina Bucy, DPM;  [facility];  Service: Podiatry;  Laterality: Left;  . APPLICATION OF WOUND VAC Left 12/21/2019   Procedure: Application Of Wound Vac;  Surgeon: Evelina Bucy, DPM;  Location: [place];  Service: Podiatry;  Laterality: Left;  . BIOPSY  02/13/2018   Procedure: BIOPSY;  Surgeon: Danie Binder, MD;  [facility];  Service: Endoscopy;;  transverse colon biopsy, gastric biopsy  . COLONOSCOPY WITH PROPOFOL N/A 02/13/2018   Procedure: COLONOSCOPY WITH PROPOFOL;  Surgeon: Danie Binder, MD;  [facility];  Service: Endoscopy;  Laterality: N/A;  11:15am  . ESOPHAGOGASTRODUODENOSCOPY (EGD) WITH PROPOFOL N/A 02/13/2018   Procedure: ESOPHAGOGASTRODUODENOSCOPY (EGD) WITH PROPOFOL;  Surgeon: Danie Binder, MD;  [facility];  Service: Endoscopy;  Laterality: N/A;  . I & D EXTREMITY Left 07/16/2019   Procedure: Insicion  AND DEBRIDEMENT EXTREMITY;  Surgeon: Evelina Bucy, DPM;  Location: [place];  Service: Podiatry;  Laterality: Left;  . INCISION AND DRAINAGE OF WOUND Left 10/13/2019   Procedure: IRRIGATION AND DEBRIDEMENT WOUND OF LEFT FOOT AND FIRST METATARSAL RESECTION;  Surgeon: Evelina Bucy, DPM;  [facility];  Service: Podiatry;  Laterality: Left;  . IRRIGATION AND DEBRIDEMENT FOOT Left 11/11/2019   Procedure: Left Foot Wound Irrigation and Debridement;  Surgeon: Evelina Bucy, DPM;  [facility];  Service: Podiatry;  Laterality: Left;  . Left arm     Left arm repair (tendon and artery)  . PERCUTANEOUS PINNING  07/16/2019   Procedure: Open Reduction Percutaneous Pinning Extremity;  Surgeon: Evelina Bucy, DPM;  Location: [place];  Service: Podiatry;;  . PILONIDAL CYST EXCISION N/A 08/08/2014   Procedure: CYST EXCISION PILONIDAL EXTENSIVE;  Surgeon: Jamesetta So, MD;  [facility]: [facility];  Service: General;  Laterality: N/A;  . POLYPECTOMY  02/13/2018   Procedure: POLYPECTOMY;  Surgeon: Danie Binder, MD;  [facility];  Service: Endoscopy;;  transverse colon polyp hs, rectal polyps times 2  . SAVORY DILATION N/A 02/13/2018   Procedure: SAVORY DILATION;  Surgeon: Danie Binder, MD;  [facility];  Service: Endoscopy;  Laterality: N/A;  . WOUND DEBRIDEMENT Left 09/04/2019   Procedure: DEBRIDEMENT WOUND WITH COMPLEX  REPAIR OF DEHISCENCE;  Surgeon: Evelina Bucy, DPM;  Location: [place];  Service: Podiatry;  Laterality: Left;  Leave patient on stretcher  . WOUND DEBRIDEMENT Left 10/16/2019   Procedure: Left foot wound debridement and closure;  Surgeon: Evelina Bucy, DPM;  [facility];  Service: Podiatry;  Laterality: Left;  . WOUND DEBRIDEMENT Left 12/18/2019   Procedure: LEFT FOOT DEBRIDEMENT WITH PARTIAL INCISION OF INFECTED BONE;  Surgeon: Evelina Bucy, DPM;  Location: [place];  Service: Podiatry;  Laterality: Left;  LEFT FOOT DEBRIDEMENT WITH PARTIAL INCISION OF INFECTED BONE    Social History:  reports that he quit smoking about 35 years ago. His smoking use included cigarettes. He has a 62.50 pack-year smoking history. He has never used smokeless tobacco. He reports current alcohol use of about 50.0 standard drinks of alcohol per  week. He reports current drug use. Drug: Marijuana.  No Known Allergies  Family History  Problem Relation Age of Onset  . Heart attack Mother        Living, 83  . Cancer Father        Deceased  . Healthy Brother   . Healthy Sister   . Colon cancer Neg Hx   . Colon polyps Neg Hx      Prior to Admission medications   Medication Sig Start Date End Date Taking? Authorizing Provider  acetaminophen (TYLENOL) 325 MG tablet Take 2 tablets (650 mg total) by mouth every 6 (six) hours as needed for mild pain (or Fever >/= 101). 12/27/19   Arrien, [name], MD  albuterol (PROVENTIL HFA;VENTOLIN HFA) 108 (90 Base) MCG/ACT inhaler Inhale 2 puffs into the lungs every 6 (six) hours as needed for wheezing or shortness of breath. 10/31/18   Hayden Rasmussen, MD  ALPRAZolam Duanne Moron) 1 MG tablet Take 1 tablet (1 mg total) by mouth 3 (three) times daily as needed for anxiety. 12/27/19   Arrien, [name], MD  amitriptyline (ELAVIL) 100 MG tablet Take 100 mg by mouth at bedtime.    [provider]  atorvastatin (LIPITOR) 40 MG tablet Take 40 mg by mouth every evening.  12/17/15   [provider]  docusate sodium (COLACE) 100 MG capsule Take 100 mg by mouth daily as needed for mild constipation.    [provider]  DULoxetine (CYMBALTA) 60 MG capsule Take 60 mg by mouth daily. 11/18/19   [provider]  ferrous sulfate 325 (65 FE) MG tablet Take 1 tablet (325 mg total) by mouth daily. 11/15/19 11/14/20  Shelly Coss, MD  gabapentin (NEURONTIN) 300 MG capsule Take 967m in the morning and at noon, then take 12084mat bedtime Patient taking differently: Take 900-1,200 mg by mouth See admin instructions. Take 90030mn the morning and at noon, then take 1200m42m bedtime 06/14/19   PateNarda AmberDO  glimepiride (AMARYL) 4 MG tablet Take 4 mg by mouth daily. 08/10/19   [provider]  glycopyrrolate (ROBINUL) 1 MG tablet Take 1 mg by mouth 2 (two) times  daily.  12/17/15   [provider]  hydrOXYzine (ATARAX/VISTARIL) 25 MG tablet Take 25 mg by mouth 4 (four) times daily as needed for anxiety.  06/17/19   [provider]  ibuprofen (ADVIL) 400 MG tablet Take 1 tablet (400 mg total) by mouth every 8 (eight) hours as needed for moderate pain. 12/27/19   Arrien, MaurJimmy Picket  lisinopril (PRINIVIL,ZESTRIL) 20 MG tablet Take 20 mg by mouth daily.  12/30/15   [provider]  omeprazole (PRILOsage  MG capsule TAKE 1 CAPSULE WITH BREAKFAST AND SUPPER. Patient taking differently: Take 20 mg by mouth 2 (two) times daily before a meal.  11/19/19   Mahala Menghini, PA-C  oxyCODONE 10 MG TABS Take 1 tablet (10 mg total) by mouth every 6 (six) hours as needed for severe pain. 12/27/19   Arrien, [name], MD  piperacillin-tazobactam (ZOSYN) IVPB Inject 3.375 g into the vein every 6 (six) hours for 23 days. Indication:  Left foot osteomyelitis Last Day of Therapy:  01/18/2020 Labs - Once weekly:  CBC/D and BMP, Labs - Every other week:  ESR and CRP 12/27/19 01/19/20  Arrien, [name], MD  primidone (MYSOLINE) 50 MG tablet Take 1 tablet (50 mg total) by mouth 2 (two) times daily. Patient taking differently: Take 50 mg by mouth 2 (two) times daily.  06/14/19   Catrinia Racicot, Donika K, DO  VOLTAREN 1 % GEL Apply 2 g topically 4 (four) times daily as needed (for pain).  08/19/15   [provider]    Physical Exam: Vitals:   01/04/20 1851 01/04/20 1909  BP: 138/76 128/80  Pulse: 93 89  Resp: 20 19  Temp: 99.5 F (37.5 C) 99.3 F (37.4 C)  TempSrc: Oral Oral  SpO2: 100% 100%   Constitutional: Obese man sitting up in bed, NAD, calm, comfortable Eyes: PERRL, lids and conjunctivae normal ENMT: Mucous membranes are moist. Posterior pharynx clear of any exudate or lesions.Normal dentition.  Neck: normal, supple, no masses. Respiratory: clear to auscultation bilaterally, no wheezing, no crackles. Normal respiratory  effort. No accessory muscle use.  Cardiovascular: Regular rate and rhythm, no murmurs / rubs / gallops. No extremity edema.  Unable to palpate pedal pulses. Abdomen: no tenderness, no masses palpated. No hepatosplenomegaly. Bowel sounds positive.  Musculoskeletal: S/p left distal foot amputation, sutures in place, actively oozing bleeding at the left medial aspect of the surgical wound.  No purulent drainage noted. Skin: Left foot surgical site with sutures in place and actively oozing blood Neurologic: CN 2-12 grossly intact. Sensation diminished both feet, Strength 5/5 in all 4.  Psychiatric: Normal judgment and insight. Alert and oriented x 3. Normal mood.   Labs on Admission: I have personally reviewed following labs and imaging studies  CBC: No results for input(s): WBC, NEUTROABS, HGB, HCT, MCV, PLT in the last 168 hours. Basic Metabolic Panel: No results for input(s): NA, K, CL, CO2, GLUCOSE, BUN, CREATININE, CALCIUM, MG, PHOS in the last 168 hours. GFR: CrCl cannot be calculated (Unknown ideal weight.). Liver Function Tests: No results for input(s): AST, ALT, ALKPHOS, BILITOT, PROT, ALBUMIN in the last 168 hours. No results for input(s): LIPASE, AMYLASE in the last 168 hours. No results for input(s): AMMONIA in the last 168 hours. Coagulation Profile: No results for input(s): INR, PROTIME in the last 168 hours. Cardiac Enzymes: No results for input(s): CKTOTAL, CKMB, CKMBINDEX, TROPONINI in the last 168 hours. BNP (last 3 results) No results for input(s): PROBNP in the last 8760 hours. HbA1C: No results for input(s): HGBA1C in the last 72 hours. CBG: Recent Labs  Lab 01/04/20 2012  GLUCAP 135*   Lipid Profile: No results for input(s): CHOL, HDL, LDLCALC, TRIG, CHOLHDL, LDLDIRECT in the last 72 hours. Thyroid Function Tests: No results for input(s): TSH, T4TOTAL, FREET4, T3FREE, THYROIDAB in the last 72 hours. Anemia Panel: No results for input(s): VITAMINB12, FOLATE,  FERRITIN, TIBC, IRON, RETICCTPCT in the last 72 hours. Urine analysis:    Component Value Date/Time   COLORURINE YELLOW  08/14/2019 [place] 08/14/2019 1232   LABSPEC 1.020 08/14/2019 1232   PHURINE 5.0 08/14/2019 [place] 08/14/2019 [place] 08/14/2019 [place] 08/14/2019 [place] 08/14/2019 1232   PROTEINUR NEGATIVE 08/14/2019 1232   UROBILINOGEN 4.0 (H) 10/09/2008 1114   NITRITE NEGATIVE 08/14/2019 1232   LEUKOCYTESUR NEGATIVE 08/14/2019 1232    Radiological Exams on Admission: DG Foot 2 Views Left  Result Date: 01/04/2020 CLINICAL DATA:  Left foot pain and swelling with clinical evidence of infection. Status post amputation on 12/21/2019. EXAM: LEFT FOOT - 2 VIEW COMPARISON:  12/21/2019 FINDINGS: Again demonstrated surgical absence of the mid and distal foot with anterior skin clips. More confluent in better defined soft tissue density anterior, superior and inferior to the amputation site. No soft tissue gas, bone destruction or periosteal reaction seen. Progressive diffuse soft tissue swelling. IMPRESSION: Increased soft tissue density anterior, superior and inferior to the amputation site with no radiographic evidence of osteomyelitis. The more confluent, increased soft tissue density could represent a postoperative hematoma or abscess with a non gas-forming organism. Electronically Signed   By: Claudie Revering M.D.   On: 01/04/2020 20:38    EKG: Not performed.  Assessment/Plan Principal Problem:   Acute blood loss anemia Active Problems:   Hypertension associated with type 2 diabetes mellitus ([place])   Hyperlipidemia associated with type 2 diabetes mellitus ([place])   Acute osteomyelitis of left foot ([place])   Type 2 diabetes mellitus with foot ulcer ([place])   Asthma   Depression with anxiety  TIMOTHEY DAHLSTROM is a 54 y.o. male with medical history significant for type 2 diabetes, left foot osteomyelitis  s/p left foot Chopart amputation 12/21/2019, hypertension, hyperlipidemia, essential tremor, anxiety/depression who is transferred from [facility] for anemia due to acute blood loss from left foot surgical site.  Acute blood loss anemia from left foot surgical site: Patient with active bleeding from left foot surgical site.  Hemoglobin at outside hospital was 5.9.  He has been transfused with PRBC.  He denies any other obvious bleeding and is not on any blood thinners or NSAIDs.  I spoke to his podiatrist, Dr. Boneta Lucks, who did see the patient in clinic couple days ago and noted a clot formation at the surgical site.  He suspects bleeding is from patient continuing to ambulate on his foot causing the bleeding to recur. -Follow-up posttransfusion CBC and transfuse further as needed -Podiatry, Dr. Boneta Lucks to see tomorrow morning  Left foot osteomyelitis s/p Chopart amputation of the left 12/21/2019 by Podiatry, [name]: Wound cultures grew MSSA, Enterococcus faecalis, Enterobacter cloacae.  ID recommended IV Zosyn for 4 weeks through 01/20/2020.  Repeat left foot x-ray without evidence of osteomyelitis.  Likely postoperative hematoma noted. -Continue IV Zosyn -Podiatry to follow-up tomorrow  Type 2 diabetes: Holding home glimepiride, continue sensitive SSI while in hospital.  Hypertension: Continue lisinopril.  Asthma: Currently stable, continue as needed albuterol.  Hyperlipidemia: Continue atorvastatin.  Essential tremor: Continue primidone.  Anxiety/depression: Continue Cymbalta, Elavil, and as needed hydroxyzine and Xanax.  DVT prophylaxis: SCDs Code Status: Full code, confirmed with patient Family Communication: Discussed with patient, he has discussed with family Disposition Plan: Patient from home.  Disposition pending correction of acute blood loss anemia/hemostasis of surgical wound site bleeding. Consults called: Podiatry, discussed with Dr. Boneta Lucks who will  see in AM.  Admission status: Admit - It is my clinical opinion that admission  to INPATIENT is reasonable and necessary because of the expectation that this patient will require hospital care that crosses at least 2 midnights to treat this condition based on the medical complexity of the problems presented.  Given the aforementioned information, the predictability of an adverse outcome is felt to be significant.     Zada Finders MD [facility]  If 7PM-7AM, please contact night-coverage [email or web address]  01/04/2020, 8:45 PM

## 2020-01-04 NOTE — Progress Notes (Addendum)
Pt arrived to unit with blood hanging from previous facility. COVID test negative from previous facility, VSS, pt not experiencing any reactions blood hanging. Pt reports pain as a 0/10 and this RN reinforced foot dressing. Will continue to monitor. Emmaleigh Longo Elon Spanner, RN 01/04/2020 6:51 PM

## 2020-01-05 ENCOUNTER — Other Ambulatory Visit: Payer: Self-pay

## 2020-01-05 ENCOUNTER — Encounter (HOSPITAL_COMMUNITY): Payer: Self-pay | Admitting: Internal Medicine

## 2020-01-05 DIAGNOSIS — E1159 Type 2 diabetes mellitus with other circulatory complications: Secondary | ICD-10-CM

## 2020-01-05 DIAGNOSIS — I10 Essential (primary) hypertension: Secondary | ICD-10-CM

## 2020-01-05 DIAGNOSIS — M86172 Other acute osteomyelitis, left ankle and foot: Secondary | ICD-10-CM

## 2020-01-05 DIAGNOSIS — J45909 Unspecified asthma, uncomplicated: Secondary | ICD-10-CM

## 2020-01-05 LAB — CBC
HCT: 21.3 % — ABNORMAL LOW (ref 39.0–52.0)
Hemoglobin: 6.9 g/dL — CL (ref 13.0–17.0)
MCH: 29.4 pg (ref 26.0–34.0)
MCHC: 32.4 g/dL (ref 30.0–36.0)
MCV: 90.6 fL (ref 80.0–100.0)
Platelets: 220 10*3/uL (ref 150–400)
RBC: 2.35 MIL/uL — ABNORMAL LOW (ref 4.22–5.81)
RDW: 16.4 % — ABNORMAL HIGH (ref 11.5–15.5)
WBC: 6.1 10*3/uL (ref 4.0–10.5)
nRBC: 0 % (ref 0.0–0.2)

## 2020-01-05 LAB — BASIC METABOLIC PANEL
Anion gap: 7 (ref 5–15)
BUN: 9 mg/dL (ref 6–20)
CO2: 26 mmol/L (ref 22–32)
Calcium: 8.9 mg/dL (ref 8.9–10.3)
Chloride: 104 mmol/L (ref 98–111)
Creatinine, Ser: 1.14 mg/dL (ref 0.61–1.24)
GFR calc Af Amer: >60 mL/min (ref >60)
GFR calc non Af Amer: >60 mL/min (ref >60)
Glucose, Bld: 249 mg/dL — ABNORMAL HIGH (ref 70–99)
Potassium: 4 mmol/L (ref 3.5–5.1)
Sodium: 137 mmol/L (ref 135–145)

## 2020-01-05 LAB — HEMOGLOBIN AND HEMATOCRIT, BLOOD
HCT: 23.1 % — ABNORMAL LOW (ref 39.0–52.0)
HCT: 26 % — ABNORMAL LOW (ref 39.0–52.0)
Hemoglobin: 7.4 g/dL — ABNORMAL LOW (ref 13.0–17.0)
Hemoglobin: 8.4 g/dL — ABNORMAL LOW (ref 13.0–17.0)

## 2020-01-05 LAB — GLUCOSE, CAPILLARY
Glucose-Capillary: 167 mg/dL — ABNORMAL HIGH (ref 70–99)
Glucose-Capillary: 157 mg/dL — ABNORMAL HIGH (ref 70–99)
Glucose-Capillary: 184 mg/dL — ABNORMAL HIGH (ref 70–99)
Glucose-Capillary: 164 mg/dL — ABNORMAL HIGH (ref 70–99)

## 2020-01-05 LAB — ABO/RH: ABO/RH(D): A POS

## 2020-01-05 LAB — PREPARE RBC (CROSSMATCH)

## 2020-01-05 MED ORDER — SODIUM CHLORIDE 0.9% IV SOLUTION: Freq: Once | INTRAVENOUS | Status: DC

## 2020-01-05 MED ORDER — CHLORHEXIDINE GLUCONATE CLOTH 2 % EX PADS: 6.0000 | MEDICATED_PAD | Freq: Every day | CUTANEOUS | Status: DC

## 2020-01-05 MED ORDER — SODIUM CHLORIDE 0.9% FLUSH
10.0000 mL | Freq: Two times a day (BID) | INTRAVENOUS | Status: DC
  Administered 2020-01-06: 10 mL

## 2020-01-05 MED ORDER — SODIUM CHLORIDE 0.9% FLUSH: 10.0000 mL | INTRAVENOUS | Status: DC | PRN

## 2020-01-05 NOTE — Plan of Care (Signed)
  Problem: Activity: Goal: Risk for activity intolerance will decrease Outcome: Progressing   

## 2020-01-05 NOTE — Progress Notes (Signed)
  Subjective:  Patient ID: Eugene Diaz, male    DOB: 15-Feb-1966,  MRN: [id number]  Chief Complaint  Patient presents with  . Wound Check    Pt states he is healing well without any concerns. Denies fever/nausea/vomiting/chills.   DOS: 11/12/2018 Procedure: Lisfranc Amputation right  54 y.o. male presents with the above complaint. History confirmed with patient. Continues to walk on the foot despite NWB restrictions.  Objective:  Physical Exam: no tenderness at the surgical site, local edema noted and calf supple, nontender. Incision: no significant erythema, slow healing, slight clear drainage present  Assessment:   1. Post-operative state    Plan:  Patient was evaluated and treated and all questions answered.  Post-operative State -Wound continues to be slow to heal but without signs of inefection -Dressing applied consisting of betadine, sterile gauze, kerlix and ACE bandage -NWB with crutches, CAM boot -Discussed should he continue weightbearing without the boot we can consider Total contact cast to promote compliance.  Return in about 1 week (around 12/05/2019) for Post-Op (No XRs).

## 2020-01-05 NOTE — Consult Note (Signed)
Reason for Consult: Left foot wound dehiscence, recurrent bleeding Referring Physician: Marquita Siskind is an 54 y.o. male.  HPI: 54 year old male well known to Chief Strategy Officer. Was d/ced here to SNF but had recurrent trips to the hospital and back for bleeding events. Bleeding events occur after weightbearing. Continues to put weight on his foot despite strict NWB status and being told many times about the risk of continued non-compliance.Marland Kitchen His Hgb has been low and he has required transfusion. Today he is well appearing, in good spirits. 1 unit PRBCs actively transfusing.  Past Medical History:  Diagnosis Date  . Alcohol abuse   . Alcoholic peripheral neuropathy ([place])   . Anemia   . Anxiety   . Arthritis   . Asthma    followed by pcp  . B12 deficiency   . Cellulitis of left lower leg    07-2019 and left foot  . Depression   . Diabetic neuropathy ([place])   . Essential tremor    neurologist-- dr patel--- due to alcohol abuse  . GERD (gastroesophageal reflux disease)   . History of esophageal stricture    s/p  dilatation 02-13-2018  . Hypercholesterolemia   . Hypertension    followed by pcp  (nuclear stress test 03-11-2014 low risk w/ no ischemia, ef 65%  . Osteomyelitis of great toe ([place])    left foot s/p amputation 08-14-2019  . Peripheral vascular disease ([place])   . Thrombocytopenia ([facility])    chronic  . Type 2 diabetes mellitus ([place])    followed by pcp--- last A1c in epic 6.6 on 07-12-2019  . Wound dehiscence, surgical    post left great toe amputation 08-14-2019    Past Surgical History:  Procedure Laterality Date  . AMPUTATION Left 10/16/2019   Procedure: Left partial second ray resection; placement of antibiotic beads;  Surgeon: Evelina Bucy, DPM;  [facility];  Service: Podiatry;  Laterality: Left;  . AMPUTATION Left 11/13/2019   Procedure: Left Midfoot Amputation - Transmetatarsal vs. Lisfranc; Placement antibiotic beads;  Surgeon: Evelina Bucy, DPM;  [facility]:  [facility];  Service: Podiatry;  Laterality: Left;  . AMPUTATION Left 12/21/2019   Procedure: Chopart Amputation left foot;  Surgeon: Evelina Bucy, DPM;  Location: [place];  Service: Podiatry;  Laterality: Left;  . AMPUTATION TOE Left 08/14/2019   Procedure: AMPUTATION GREAT TOE;  Surgeon: Evelina Bucy, DPM;  [facility];  Service: Podiatry;  Laterality: Left;  . APPLICATION OF WOUND VAC Left 12/21/2019   Procedure: Application Of Wound Vac;  Surgeon: Evelina Bucy, DPM;  Location: [place];  Service: Podiatry;  Laterality: Left;  . BIOPSY  02/13/2018   Procedure: BIOPSY;  Surgeon: Danie Binder, MD;  [facility];  Service: Endoscopy;;  transverse colon biopsy, gastric biopsy  . COLONOSCOPY WITH PROPOFOL N/A 02/13/2018   Procedure: COLONOSCOPY WITH PROPOFOL;  Surgeon: Danie Binder, MD;  [facility];  Service: Endoscopy;  Laterality: N/A;  11:15am  . ESOPHAGOGASTRODUODENOSCOPY (EGD) WITH PROPOFOL N/A 02/13/2018   Procedure: ESOPHAGOGASTRODUODENOSCOPY (EGD) WITH PROPOFOL;  Surgeon: Danie Binder, MD;  [facility];  Service: Endoscopy;  Laterality: N/A;  . I & D EXTREMITY Left 07/16/2019   Procedure: Insicion  AND DEBRIDEMENT EXTREMITY;  Surgeon: Evelina Bucy, DPM;  Location: [place];  Service: Podiatry;  Laterality: Left;  . INCISION AND DRAINAGE OF WOUND Left 10/13/2019   Procedure: IRRIGATION AND DEBRIDEMENT WOUND OF LEFT FOOT AND  FIRST METATARSAL RESECTION;  Surgeon: Evelina Bucy, DPM;  [facility];  Service: Podiatry;  Laterality: Left;  . IRRIGATION AND DEBRIDEMENT FOOT Left 11/11/2019   Procedure: Left Foot Wound Irrigation and Debridement;  Surgeon: Evelina Bucy, DPM;  [facility];  Service: Podiatry;  Laterality: Left;  . Left arm     Left arm repair (tendon and artery)  . PERCUTANEOUS PINNING  07/16/2019   Procedure: Open Reduction Percutaneous Pinning Extremity;  Surgeon: Evelina Bucy, DPM;  Location: [place];  Service:  Podiatry;;  . PILONIDAL CYST EXCISION N/A 08/08/2014   Procedure: CYST EXCISION PILONIDAL EXTENSIVE;  Surgeon: Jamesetta So, MD;  [facility];  Service: General;  Laterality: N/A;  . POLYPECTOMY  02/13/2018   Procedure: POLYPECTOMY;  Surgeon: Danie Binder, MD;  [facility];  Service: Endoscopy;;  transverse colon polyp hs, rectal polyps times 2  . SAVORY DILATION N/A 02/13/2018   Procedure: SAVORY DILATION;  Surgeon: Danie Binder, MD;  [facility];  Service: Endoscopy;  Laterality: N/A;  . WOUND DEBRIDEMENT Left 09/04/2019   Procedure: DEBRIDEMENT WOUND WITH COMPLEX  REPAIR OF DEHISCENCE;  Surgeon: Evelina Bucy, DPM;  Location: [place];  Service: Podiatry;  Laterality: Left;  Leave patient on stretcher  . WOUND DEBRIDEMENT Left 10/16/2019   Procedure: Left foot wound debridement and closure;  Surgeon: Evelina Bucy, DPM;  [facility];  Service: Podiatry;  Laterality: Left;  . WOUND DEBRIDEMENT Left 12/18/2019   Procedure: LEFT FOOT DEBRIDEMENT WITH PARTIAL INCISION OF INFECTED BONE;  Surgeon: Evelina Bucy, DPM;  Location: [place];  Service: Podiatry;  Laterality: Left;  LEFT FOOT DEBRIDEMENT WITH PARTIAL INCISION OF INFECTED BONE    Family History  Problem Relation Age of Onset  . Heart attack Mother        Living, 48  . Cancer Father        Deceased  . Healthy Brother   . Healthy Sister   . Colon cancer Neg Hx   . Colon polyps Neg Hx     Social History:  reports that he quit smoking about 35 years ago. His smoking use included cigarettes. He has a 62.50 pack-year smoking history. He has never used smokeless tobacco. He reports current alcohol use of about 50.0 standard drinks of alcohol per week. He reports current drug use. Drug: Marijuana.  Allergies: No Known Allergies  Medications: I have reviewed the patient's current medications.  Results for orders placed or performed during the hospital encounter of 01/04/20  (from the past 48 hour(s))  Glucose, capillary     Status: Abnormal   Collection Time: 01/04/20  8:12 PM  Result Value Ref Range   Glucose-Capillary 135 (H) 70 - 99 mg/dL    Comment: Glucose reference range applies only to samples taken after fasting for at least 8 hours.  CBC     Status: Abnormal   Collection Time: 01/05/20 12:09 AM  Result Value Ref Range   WBC 6.1 4.0 - 10.5 K/uL   RBC 2.35 (L) 4.22 - 5.81 MIL/uL   Hemoglobin 6.9 (LL) 13.0 - 17.0 g/dL    Comment: REPEATED TO VERIFY THIS CRITICAL RESULT HAS VERIFIED AND BEEN CALLED TO ERIC YASSIR,RN BY ZELDA BEECH ON 03 07 2021 AT 0103, AND HAS BEEN READ BACK.     HCT 21.3 (L) 39.0 - 52.0 %   MCV 90.6 80.0 - 100.0 fL   MCH 29.4 26.0 -  34.0 pg   MCHC 32.4 30.0 - 36.0 g/dL   RDW 16.4 (H) 11.5 - 15.5 %   Platelets 220 150 - 400 K/uL   nRBC 0.0 0.0 - 0.2 %    Comment: Performed at [facility], [place] [address] [place], [place] [id number]  Basic metabolic panel     Status: Abnormal   Collection Time: 01/05/20 12:09 AM  Result Value Ref Range   Sodium 137 135 - 145 mmol/L   Potassium 4.0 3.5 - 5.1 mmol/L   Chloride 104 98 - 111 mmol/L   CO2 26 22 - 32 mmol/L   Glucose, Bld 249 (H) 70 - 99 mg/dL    Comment: Glucose reference range applies only to samples taken after fasting for at least 8 hours.   BUN 9 6 - 20 mg/dL   Creatinine, Ser 1.14 0.61 - 1.24 mg/dL   Calcium 8.9 8.9 - 10.3 mg/dL   GFR calc non Af Amer >60 >60 mL/min   GFR calc Af Amer >60 >60 mL/min   Anion gap 7 5 - 15    Comment: Performed at [facility] [address], [place], [place] [address]  Prepare RBC     Status: None   Collection Time: 01/05/20  2:28 AM  Result Value Ref Range   Order Confirmation      ORDER PROCESSED BY BLOOD BANK Performed at [facility], [place] [address] [place], [place] [address]   Type and screen [place]     Status: None (Preliminary result)   Collection Time: 01/05/20  2:28 AM  Result Value  Ref Range   ABO/RH(D) A POS    Antibody Screen NEG    Sample Expiration 01/08/2020,2359    Unit Number [id number]    Blood Component Type RED CELLS,LR    Unit division 00    Status of Unit ISSUED    Transfusion Status OK TO TRANSFUSE    Crossmatch Result      Compatible Performed at [facility], [place] [address], [place], [place] [address]    Unit Number [id number]    Blood Component Type RED CELLS,LR    Unit division 00    Status of Unit ALLOCATED    Transfusion Status OK TO TRANSFUSE    Crossmatch Result Compatible   ABO/Rh     Status: None (Preliminary result)   Collection Time: 01/05/20  2:28 AM  Result Value Ref Range   ABO/RH(D)      A POS Performed at [facility], [place] [address], [place], [place] [address]   Glucose, capillary     Status: Abnormal   Collection Time: 01/05/20  6:30 AM  Result Value Ref Range   Glucose-Capillary 167 (H) 70 - 99 mg/dL    Comment: Glucose reference range applies only to samples taken after fasting for at least 8 hours.    DG Foot 2 Views Left  Result Date: 01/04/2020 CLINICAL DATA:  Left foot pain and swelling with clinical evidence of infection. Status post amputation on 12/21/2019. EXAM: LEFT FOOT - 2 VIEW COMPARISON:  12/21/2019 FINDINGS: Again demonstrated surgical absence of the mid and distal foot with anterior skin clips. More confluent in better defined soft tissue density anterior, superior and inferior to the amputation site. No soft tissue gas, bone destruction or periosteal reaction seen. Progressive diffuse soft tissue swelling. IMPRESSION: Increased soft tissue density anterior, superior and inferior to the amputation site with no radiographic  evidence of osteomyelitis. The more confluent, increased soft tissue density could represent a postoperative hematoma or abscess with a non gas-forming organism. Electronically Signed   By: Claudie Revering M.D.   On: 01/04/2020 20:38    Blood pressure 133/78, pulse 77,  temperature 97.8 F (36.6 C), temperature source Oral, resp. rate 18, SpO2 97 %.  Vitals:   01/05/20 0644 01/05/20 0707  BP: 139/74 133/78  Pulse: 76 77  Resp: 18 18  Temp: 98.1 F (36.7 C) 97.8 F (36.6 C)  SpO2: 96% 97%    General AA&O x3. Normal mood and affect.  Vascular Left foot warm and well perfused. No active bleeding. Skin flaps at amputation margin warm and viable with capillary refill  Neurologic Epicritic sensation grossly absent.  Dermatologic (Wound) Amputation stump healing well except for 2 cm medial dehiscence. Probes deep but not to bone. Hematoma expressed. No active bleeding. Dried blood along remainder of incision but appears without dehiscence. Not disturbed.  Orthopedic: Motor intact BLE. Chopart amputation left    Assessment/Plan:  S/p L Chopart Amputation with recurrent bleeding issues 2/2 dehiscence and non-compliance with WB status -No active bleeding noted. Dehiscence along incision with clotted blood noted. He is likely disturbing blood clot formation with repeated weightbearing.  -The wound was copiously irrigated today. Hematoma evacuated. The wound was loosely packed with gauze and dressed with 4x4, kerlix, and ACE bandage -Antibiotics: Continue Zosyn -WB Status: Strict NWB LLE -Wound Care: Order Wound VAC to LLE. Left foot wound remove gauze packed into wound at medial incision. Pack sponge deep into wound. Protect sutures and periwound with non-adherent. Place occlusive film an set to 125 mmHg continuous. -No need for surgical intervention at this time.  Evelina Bucy 01/05/2020, 10:10 AM   Best available via secure chat for questions or concerns.

## 2020-01-05 NOTE — Plan of Care (Signed)
  Problem: Education: Goal: Knowledge of General Education information will improve Description: Including pain rating scale, medication(s)/side effects and non-pharmacologic comfort measures Outcome: Progressing   Problem: Activity: Goal: Risk for activity intolerance will decrease Outcome: Progressing   Problem: Nutrition: Goal: Adequate nutrition will be maintained Outcome: Progressing   

## 2020-01-05 NOTE — Telephone Encounter (Signed)
Patient was seen today.

## 2020-01-05 NOTE — Telephone Encounter (Signed)
Addressed during hospitalization

## 2020-01-05 NOTE — Progress Notes (Signed)
Pt completed 2 units of PRBC today, VSS, no reaction, post h&h ordered. will continue to monitor.

## 2020-01-05 NOTE — Progress Notes (Signed)
PROGRESS NOTE        PATIENT DETAILS Name: Eugene Diaz Age: 54 y.o. Sex: male Date of Birth: 07-04-1966 Admit Date: 01/04/2020 Admitting Physician Lenore Cordia, MD [id number], Carlean Jews, PA-C  Brief Narrative: Patient is a 54 y.o. 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  See below.  Significant events: 2/16-2/26>> Admit for left foot osteomyelitis-s/p debridement on 2/17-then Chopart's amputation on 2/20.  Discharged to SNF on 4 weeks of IV Zosyn through 3/22.  2/27>> Presented to [facility] on 2/27 with bleeding from amputation site and hemoglobin of 6.6.  Discharged back to SNF after transfusion.  2/28>> presented to [facility]-bleeding from surgical site with hemoglobin of 6.2-admitted-given 2 units of PRBC and discharged on 3/3.  3/4>> presented to [facility] from surgical site-hemoglobin 7.8-surgical consult-nonweightbearing to left foot-follow with primary surgeon.  3/5>> presented to [facility] from surgical site-patient refused to return to SNF-and left AMA.  3/6>> return back to [facility]-bleeding from surgical site-hemoglobin 5.9-transferred to [facility].  Antimicrobial therapy: Zosyn: 2/16>> planned stop date 3/22 Vancomycin: 2/16>> 2/22  Microbiology data: 2/16>> blood culture: Negative 2/17>> left foot culture-Enterobacter cloacae, MSSA, Enterococcus faecalis, bacteroids  Procedures : 2/20>> Chopart amputation of the left foot with wound VAC application [id number] debridement of left foot, partial excision of cuboid bone, and lateral cuneiform bone  Consults: Podiatry  DVT Prophylaxis : SCD's  Subjective: Lying comfortably in bed-denies any chest pain or  shortness of breath.  Acknowledges that he has been noncompliant to weightbearing status.  Assessment/Plan: Left foot amputation site bleeding with blood loss anemia: Dry dressing this morning-PRBC transfusion ongoing.  Follow post-transfusion CBC.  He has no history of melena/hematochezia or bleeding from any other site that is evident.  Suspect etiology of bleeding likely to be noncompliance to weightbearing status.  Podiatry consulted by admitting MD-await further recommendations.  Left foot osteomyelitis-s/p Chopart amputation: Remains on IV Zosyn-planned stop date of 3/22 per discharge summary from last admission.  DM-2 (A1c 6.4 on 11/10/2019): CBGs stable with SSI-hold glimepiride for now.  Recent Labs    01/04/20 2012 01/05/20 0630  GLUCAP 135* 167*   HTN: Controlled-continue lisinopril.  HLD: Continue Lipitor  Essential tremor: Stable-continue primidone  Anxiety/depression: Appears stable-continue Cymbalta and Elavil.  On prn hydroxyzine and Xanax.  Asthma: No flare-continue inhalers  Obesity: Estimated body mass index is 34.66 kg/m as calculated from the following:   Height as of 12/18/19: 6' 2.02" (1.88 m).   Weight as of 12/18/19: 122.5 kg.    Diet: Diet Order            Diet heart healthy/carb modified Room service appropriate? Yes; Fluid consistency: Thin  Diet effective now              Code Status: Full code   Family Communication: Mother over the phone on 3/7  Disposition Plan: Home with Home health vs SNF when ready for discharge  Barriers to Discharge: Bleeding at left foot amputation site with severe blood loss anemia requiring PRBC transfusion.  Awaiting podiatry eval.   Antimicrobial agents: Anti-infectives (From admission, onward)   Start  Dose/Rate Route Frequency Ordered Stop   01/04/20 2200  piperacillin-tazobactam (ZOSYN) IVPB 3.375 g     3.375 g 12.5 mL/hr over 240 Minutes Intravenous Every 8 hours 01/04/20 2054 01/20/20 2159        Time spent: 25- minutes-Greater than 50% of this time was spent in counseling, explanation of diagnosis, planning of further management, and coordination of care.  MEDICATIONS: Scheduled Meds: . sodium chloride   Intravenous Once  . amitriptyline  100 mg Oral QHS  . atorvastatin  40 mg Oral QPM  . Chlorhexidine Gluconate Cloth  6 each Topical Daily  . DULoxetine  60 mg Oral Daily  . gabapentin  1,200 mg Oral QHS  . gabapentin  900 mg Oral 2 times per day  . insulin aspart  0-9 Units Subcutaneous TID WC  . lisinopril  20 mg Oral Daily  . pantoprazole  40 mg Oral Daily  . primidone  50 mg Oral BID  . sodium chloride flush  10-40 mL Intracatheter Q12H   Continuous Infusions: . piperacillin-tazobactam (ZOSYN)  IV 3.375 g (01/05/20 0639)   PRN Meds:.acetaminophen **OR** acetaminophen, albuterol, ALPRAZolam, hydrOXYzine, ondansetron **OR** ondansetron (ZOFRAN) IV, oxyCODONE, sodium chloride flush   PHYSICAL EXAM: Vital signs: Vitals:   01/05/20 0330 01/05/20 0644 01/05/20 0707 01/05/20 1022  BP: 140/76 139/74 133/78 133/77  Pulse: 72 76 77 75  Resp: 17 18 18 18   Temp: 97.7 F (36.5 C) 98.1 F (36.7 C) 97.8 F (36.6 C) 98.1 F (36.7 C)  TempSrc: Oral Oral Oral Oral  SpO2: 98% 96% 97% 100%   There were no vitals filed for this visit. There is no height or weight on file to calculate BMI.   Gen Exam:Alert awake-not in any distress HEENT:atraumatic, normocephalic Chest: B/L clear to auscultation anteriorly CVS:S1S2 regular Abdomen:soft non tender, non distended Extremities:no edema Neurology: Non focal Skin: no rash  I have personally reviewed following labs and imaging studies  LABORATORY DATA: CBC: Recent Labs  Lab 01/05/20 0009  WBC 6.1  HGB 6.9*  HCT 21.3*  MCV 90.6  PLT [id number]    Basic Metabolic Panel: Recent Labs  Lab 01/05/20 0009  NA 137  K 4.0  CL 104  CO2 26  GLUCOSE 249*  BUN 9  CREATININE 1.14  CALCIUM 8.9    GFR: CrCl cannot be  calculated (Unknown ideal weight.).  Liver Function Tests: No results for input(s): AST, ALT, ALKPHOS, BILITOT, PROT, ALBUMIN in the last 168 hours. No results for input(s): LIPASE, AMYLASE in the last 168 hours. No results for input(s): AMMONIA in the last 168 hours.  Coagulation Profile: No results for input(s): INR, PROTIME in the last 168 hours.  Cardiac Enzymes: No results for input(s): CKTOTAL, CKMB, CKMBINDEX, TROPONINI in the last 168 hours.  BNP (last 3 results) No results for input(s): PROBNP in the last 8760 hours.  Lipid Profile: No results for input(s): CHOL, HDL, LDLCALC, TRIG, CHOLHDL, LDLDIRECT in the last 72 hours.  Thyroid Function Tests: No results for input(s): TSH, T4TOTAL, FREET4, T3FREE, THYROIDAB in the last 72 hours.  Anemia Panel: No results for input(s): VITAMINB12, FOLATE, FERRITIN, TIBC, IRON, RETICCTPCT in the last 72 hours.  Urine analysis:    Component Value Date/Time   COLORURINE YELLOW 08/14/2019 [place] 08/14/2019 1232   LABSPEC 1.020 08/14/2019 1232   PHURINE 5.0 08/14/2019 [place] 08/14/2019 1232   HGBUR NEGATIVE 08/14/2019 [place] 08/14/2019 [place] 08/14/2019 1232  PROTEINUR NEGATIVE 08/14/2019 1232   UROBILINOGEN 4.0 (H) 10/09/2008 1114   NITRITE NEGATIVE 08/14/2019 1232   LEUKOCYTESUR NEGATIVE 08/14/2019 1232    Sepsis Labs: Lactic Acid, Venous    Component Value Date/Time   LATICACIDVEN 1.6 12/17/2019 1822    MICROBIOLOGY: Recent Results (from the past 240 hour(s))  SARS CORONAVIRUS 2 (TAT 6-24 HRS) Nasopharyngeal Nasopharyngeal Swab     Status: None   Collection Time: 12/26/19 11:57 AM   Specimen: Nasopharyngeal Swab  Result Value Ref Range Status   SARS Coronavirus 2 NEGATIVE NEGATIVE Final    Comment: (NOTE) SARS-CoV-2 target nucleic acids are NOT DETECTED. The SARS-CoV-2 RNA is generally detectable in upper and lower respiratory specimens  during the acute phase of infection. Negative results do not preclude SARS-CoV-2 infection, do not rule out co-infections with other pathogens, and should not be used as the sole basis for treatment or other patient management decisions. Negative results must be combined with clinical observations, patient history, and epidemiological information. The expected result is Negative. Fact Sheet for Patients: SugarRoll.be Fact Sheet for Healthcare Providers: https://www.woods-mathews.com/ This test is not yet approved or cleared by the [place] FDA and  has been authorized for detection and/or diagnosis of SARS-CoV-2 by FDA under an Emergency Use Authorization (EUA). This EUA will remain  in effect (meaning this test can be used) for the duration of the COVID-19 declaration under Section 56 4(b)(1) of the Act, 21 U.S.C. section 360bbb-3(b)(1), unless the authorization is terminated or revoked sooner. Performed at [facility], [place] [address] [place], [place] [address]     RADIOLOGY STUDIES/RESULTS: DG Foot 2 Views Left  Result Date: 01/04/2020 CLINICAL DATA:  Left foot pain and swelling with clinical evidence of infection. Status post amputation on 12/21/2019. EXAM: LEFT FOOT - 2 VIEW COMPARISON:  12/21/2019 FINDINGS: Again demonstrated surgical absence of the mid and distal foot with anterior skin clips. More confluent in better defined soft tissue density anterior, superior and inferior to the amputation site. No soft tissue gas, bone destruction or periosteal reaction seen. Progressive diffuse soft tissue swelling. IMPRESSION: Increased soft tissue density anterior, superior and inferior to the amputation site with no radiographic evidence of osteomyelitis. The more confluent, increased soft tissue density could represent a postoperative hematoma or abscess with a non gas-forming organism. Electronically Signed   By: Claudie Revering M.D.   On:  01/04/2020 20:38     LOS: 1 day   Oren Binet, MD  [facility]    To contact the attending provider between 7A-7P or the covering provider during after hours 7P-7A, please log into the web site [email or web address] and access using universal [place] password for that web site. If you do not have the password, please call the hospital operator.  01/05/2020, 10:23 AM

## 2020-01-05 NOTE — Progress Notes (Signed)
Physical Therapy Evaluation Patient Details Name: Eugene Diaz MRN: [id number] DOB: 05-25-1966 Today's Date: 01/05/2020   History of Present Illness  Eugene Diaz is a 54 y.o. 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  History is supplemented from chart review and outside hospital records. Patient was last admitted at [facility] from 12/17/2019-12/27/2019 for left foot osteomyelitis.  He was seen by podiatry and underwent debridement on 12/18/2019 and then Chopart amputation on 12/21/2019.  Wound cultures grew MSSA, E faecalis, and Enterobacter Cloacae.  ID recommended continuing 4 weeks of IV Zosyn through 01/20/2020.  Patient was discharged to SNF, [facility]. He presented to [facility] on 12/28/2019 for bleeding from his surgical site.  Hemoglobin was 6.6 at that time and he was transfused PRBC and discharged back to SNF.  He returned again on 12/29/2019 with bleeding from surgical site with hemoglobin 6.2.  He was admitted and transfused 2 units PRBCs and discharged on 01/01/2020 with a hemoglobin of 8.1.  He returned on 01/02/2020, again with bleeding from the surgical site.  Hemoglobin was 7.8.  Surgical consult was obtained and recommendation was nonweightbearing on the left lower extremity and follow-up with his primary surgeon.  He was discharged from the ED and again returned the following day, 01/03/2020.  Patient refused to return to the [facility] and he left the ED [place].  Clinical Impression  Pt reports being willing to work with PT today. Pt reports that when at the SNF he started bleeding because the wound vac sponge had part of it left on, vs other wound packing. He demonstrates continued need for reminder of NWB on LLE to assist with healing process, with patient verbalizing and repeating back precautions 3 times  throughout tx today. Pt was able to maintain precautions when performing bed mobility and ambulating to chair. LLE propped up with towel behind distal calf. Pt reports being comfortable. Pt verbalizes that he is willing to return to SNF following discharge to let it heal fully before returning home and understanding to maintain NWB precautions. Plan to progress patient as tolerated to address deficits listed below.      Follow Up Recommendations SNF    Equipment Recommendations  None recommended by PT    Recommendations for Other Services       Precautions / Restrictions Precautions Precautions: Fall Restrictions Weight Bearing Restrictions: Yes LLE Weight Bearing: Non weight bearing      Mobility  Bed Mobility Overal bed mobility: Modified Independent Bed Mobility: Sit to Supine     Supine to sit: Supervision Sit to supine: Supervision   General bed mobility comments: no physical assistance needed  Transfers Overall transfer level: Needs assistance Equipment used: Rolling walker (2 wheeled) Transfers: Sit to/from [facility] Sit to Stand: Supervision(required VCs to maintain WB precautions)         General transfer comment: Pt demonstrated minimal fatigue, no dizziness or anything upon standing, requiring no assistance. Just continues to require VCs for WB precautions  Ambulation/Gait Ambulation/Gait assistance: Supervision;Min guard Gait Distance (Feet): 30 Feet Assistive device: Rolling walker (2 wheeled) Gait Pattern/deviations: (Hop to pattern. NWB maintained throughout after extensive ed)   Gait velocity interpretation: 1.31 - 2.62 ft/sec, indicative of limited community ambulator General Gait Details: CGA-supervision for safety due to anemia, no near LOB demonstrated.    Balance Overall balance assessment: Needs assistance Sitting-balance support: Feet supported  Sitting balance-Leahy Scale: Good     Standing balance support: Bilateral  upper extremity supported Standing balance-Leahy Scale: Fair        Pertinent Vitals/Pain Pain Assessment: Faces Faces Pain Scale: Hurts a little bit Pain Location: L foot Pain Descriptors / Indicators: Aching Pain Intervention(s): Repositioned    Home Living Family/patient expects to be discharged to:: Skilled nursing facility Living Arrangements: Alone Available Help at Discharge: Friend(s);Family;Available PRN/intermittently Type of Home: Mobile home Home Access: Stairs to enter Entrance Stairs-Rails: None Entrance Stairs-Number of Steps: 1 Home Layout: One level Home Equipment: Cane - single point;Crutches;Walker - 2 wheels;Other (comment)(Knee scooter) Additional Comments: pt's friend Grayland Ormond can assist him as needed    Prior Function Level of Independence: Independent with assistive device(s)         Comments: ambulates with either crutches or RW; washes up at that sink     Hand Dominance   Dominant Hand: Right    Extremity/Trunk Assessment   Upper Extremity Assessment Upper Extremity Assessment: Defer to OT evaluation    Lower Extremity Assessment Lower Extremity Assessment: Generalized weakness LLE Deficits / Details: s/p chopart amputation       Communication   Communication: No difficulties  Cognition Arousal/Alertness: Awake/alert Behavior During Therapy: WFL for tasks assessed/performed Overall Cognitive Status: Within Functional Limits for tasks assessed       General Comments: Pt verbalized understanding to focus on mobility without WB due to patient attempting to put weight through LLE in standing first "to see how it will feel".      General Comments General comments (skin integrity, edema, etc.): Pt demonstrated good ability standing on RLE without touching down LLE, but required education to improve WB adherance and rationale.         Assessment/Plan    PT Assessment Patient needs continued PT services  PT Problem List Decreased  strength;Decreased range of motion;Decreased activity tolerance;Decreased mobility;Decreased balance;Decreased coordination;Decreased knowledge of use of DME;Decreased safety awareness;Decreased knowledge of precautions;Pain       PT Treatment Interventions DME instruction;Gait training;Stair training;Functional mobility training;Therapeutic activities;Therapeutic exercise;Balance training;Neuromuscular re-education;Patient/family education    PT Goals (Current goals can be found in the Care Plan section)  Acute Rehab PT Goals Patient Stated Goal: to get stronger & go home PT Goal Formulation: With patient Time For Goal Achievement: 01/19/20 Potential to Achieve Goals: Good    Frequency Min 3X/week   Barriers to discharge Inaccessible home environment;Decreased caregiver support         AM-PAC PT "6 Clicks" Mobility  Outcome Measure Help needed turning from your back to your side while in a flat bed without using bedrails?: None Help needed moving from lying on your back to sitting on the side of a flat bed without using bedrails?: None Help needed moving to and from a bed to a chair (including a wheelchair)?: None Help needed standing up from a chair using your arms (e.g., wheelchair or bedside chair)?: A Little Help needed to walk in hospital room?: A Little Help needed climbing 3-5 steps with a railing? : A Lot 6 Click Score: 20    End of Session Equipment Utilized During Treatment: Gait belt;Other (comment)(RW) Activity Tolerance: Patient tolerated treatment well Patient left: with call bell/phone within reach;in chair;with chair alarm set Nurse Communication: Mobility status PT Visit Diagnosis: Other abnormalities of gait and mobility (R26.89);Muscle weakness (generalized) (M62.81);Pain;Other (comment)(Education for maintaining precautions) Pain - Right/Left: Left Pain - part of body: Leg;Ankle and joints of foot    Time: [id number]  PT Time Calculation (min) (ACUTE ONLY):  14 min   Charges:   PT Evaluation $PT Eval Low Complexity: 1 Low          Ann Held PT, DPT Acute Rehab [facility] P: [phone]   Kathyrn Warmuth A Danarius Mcconathy 01/05/2020, 4:01 PM

## 2020-01-06 ENCOUNTER — Encounter: Payer: Self-pay | Admitting: Podiatry

## 2020-01-06 LAB — COMPREHENSIVE METABOLIC PANEL
ALT: 10 U/L (ref 0–44)
AST: 12 U/L — ABNORMAL LOW (ref 15–41)
Albumin: 2.9 g/dL — ABNORMAL LOW (ref 3.5–5.0)
Alkaline Phosphatase: 52 U/L (ref 38–126)
Anion gap: 7 (ref 5–15)
BUN: 11 mg/dL (ref 6–20)
CO2: 28 mmol/L (ref 22–32)
Calcium: 9 mg/dL (ref 8.9–10.3)
Chloride: 105 mmol/L (ref 98–111)
Creatinine, Ser: 0.98 mg/dL (ref 0.61–1.24)
GFR calc Af Amer: >60 mL/min (ref >60)
GFR calc non Af Amer: >60 mL/min (ref >60)
Glucose, Bld: 146 mg/dL — ABNORMAL HIGH (ref 70–99)
Potassium: 4.1 mmol/L (ref 3.5–5.1)
Sodium: 140 mmol/L (ref 135–145)
Total Bilirubin: 0.8 mg/dL (ref 0.3–1.2)
Total Protein: 6 g/dL — ABNORMAL LOW (ref 6.5–8.1)

## 2020-01-06 LAB — TYPE AND SCREEN
ABO/RH(D): A POS
Antibody Screen: NEGATIVE
Unit division: 0
Unit division: 0

## 2020-01-06 LAB — GLUCOSE, CAPILLARY
Glucose-Capillary: 130 mg/dL — ABNORMAL HIGH (ref 70–99)
Glucose-Capillary: 197 mg/dL — ABNORMAL HIGH (ref 70–99)
Glucose-Capillary: 233 mg/dL — ABNORMAL HIGH (ref 70–99)
Glucose-Capillary: 175 mg/dL — ABNORMAL HIGH (ref 70–99)

## 2020-01-06 LAB — CBC
HCT: 25.7 % — ABNORMAL LOW (ref 39.0–52.0)
Hemoglobin: 8.2 g/dL — ABNORMAL LOW (ref 13.0–17.0)
MCH: 29.7 pg (ref 26.0–34.0)
MCHC: 31.9 g/dL (ref 30.0–36.0)
MCV: 93.1 fL (ref 80.0–100.0)
Platelets: 187 10*3/uL (ref 150–400)
RBC: 2.76 MIL/uL — ABNORMAL LOW (ref 4.22–5.81)
RDW: 16 % — ABNORMAL HIGH (ref 11.5–15.5)
WBC: 6.6 10*3/uL (ref 4.0–10.5)
nRBC: 0 % (ref 0.0–0.2)

## 2020-01-06 LAB — BPAM RBC
Blood Product Expiration Date: 202103292359
Blood Product Expiration Date: 202103292359
ISSUE DATE / TIME: 202103070644
ISSUE DATE / TIME: 202103071343
Unit Type and Rh: 6200
Unit Type and Rh: 6200

## 2020-01-06 LAB — SARS CORONAVIRUS 2 (TAT 6-24 HRS): SARS Coronavirus 2: NEGATIVE

## 2020-01-06 NOTE — Progress Notes (Signed)
PROGRESS NOTE        PATIENT DETAILS Name: Eugene Diaz Age: 54 y.o. Sex: male Date of Birth: 08/13/1966 Admit Date: 01/04/2020 Admitting Physician Lenore Cordia, MD [id number], Carlean Jews, PA-C  Brief Narrative: Patient is a 54 y.o. 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  See below.  Significant events: 2/16-2/26>> Admit for left foot osteomyelitis-s/p debridement on 2/17-then Chopart's amputation on 2/20.  Discharged to SNF on 4 weeks of IV Zosyn through 3/22.  2/27>> Presented to [facility] on 2/27 with bleeding from amputation site and hemoglobin of 6.6.  Discharged back to SNF after transfusion.  2/28>> presented to [facility] [place]-bleeding from surgical site with hemoglobin of 6.2-admitted-given 2 units of PRBC and discharged on 3/3.  3/4>> presented to [place] from surgical site-hemoglobin 7.8-surgical consult-nonweightbearing to left foot-follow with primary surgeon.  3/5>> presented to [place] from surgical site-patient refused to return to SNF-and left AMA.  3/6>> return back to [facility]-bleeding from surgical site-hemoglobin 5.9-transferred to [facility].  Antimicrobial therapy: Zosyn: 2/16>> planned stop date 3/22 Vancomycin: 2/16>> 2/22  Microbiology data: 2/16>> blood culture: Negative 2/17>> left foot culture-Enterobacter cloacae, MSSA, Enterococcus faecalis, bacteroids  Procedures : 2/20>> Chopart amputation of the left foot with wound VAC application [id number] debridement of left foot, partial excision of cuboid bone, and lateral cuneiform bone  Consults: Podiatry  DVT Prophylaxis : SCD's  Subjective: No further bleeding from his stump.  Denies any  chest pain or shortness of breath.  Assessment/Plan: Left foot amputation site bleeding with blood loss anemia: Dry dressing this morning-transfuse 2 units of PRBC.  Hemoglobin stable at 8.2.  Spoke with Dr. Marry Guan supportive care-patient needs to be strict nonweightbearing.  Have again counseled regarding weightbearing instructions this morning.  Stable for SNF when bed available.  Dry dressing this morning-PRBC transfusion ongoing.   Left foot osteomyelitis-s/p Chopart amputation: Remains on IV Zosyn-planned stop date of 3/22 per discharge summary from last admission.  DM-2 (A1c 6.4 on 11/10/2019): CBGs stable with SSI-hold glimepiride for now.  Recent Labs    01/05/20 2029 01/06/20 0630 01/06/20 1155  GLUCAP 164* 130* 197*   HTN: Controlled-continue lisinopril.  HLD: Continue Lipitor  Essential tremor: Stable-continue primidone  Anxiety/depression: Appears stable-continue Cymbalta and Elavil.  On prn hydroxyzine and Xanax.  Asthma: No flare-continue inhalers  Obesity: Estimated body mass index is 34.66 kg/m as calculated from the following:   Height as of this encounter: 6' 2.02" (1.88 m).   Weight as of this encounter: 122.5 kg.    Diet: Diet Order            Diet heart healthy/carb modified Room service appropriate? Yes; Fluid consistency: Thin  Diet effective now              Code Status: Full code   Family Communication: Mother over the phone on 3/8  Disposition Plan: SNF   Barriers to Discharge: Awaiting SNF bed  Antimicrobial agents: Anti-infectives (From admission, onward)   Start     Dose/Rate Route Frequency Ordered Stop   01/04/20 2200  piperacillin-tazobactam (ZOSYN) IVPB 3.375 g     3.375  g 12.5 mL/hr over 240 Minutes Intravenous Every 8 hours 01/04/20 2054 01/20/20 2159     Time spent: 25- minutes-Greater than 50% of this time was spent in counseling, explanation of diagnosis, planning of further management, and coordination of  care.  MEDICATIONS: Scheduled Meds: . sodium chloride   Intravenous Once  . amitriptyline  100 mg Oral QHS  . atorvastatin  40 mg Oral QPM  . Chlorhexidine Gluconate Cloth  6 each Topical Daily  . DULoxetine  60 mg Oral Daily  . gabapentin  1,200 mg Oral QHS  . gabapentin  900 mg Oral 2 times per day  . insulin aspart  0-9 Units Subcutaneous TID WC  . lisinopril  20 mg Oral Daily  . pantoprazole  40 mg Oral Daily  . primidone  50 mg Oral BID  . sodium chloride flush  10-40 mL Intracatheter Q12H   Continuous Infusions: . piperacillin-tazobactam (ZOSYN)  IV 3.375 g (01/06/20 0559)   PRN Meds:.acetaminophen **OR** acetaminophen, albuterol, ALPRAZolam, hydrOXYzine, ondansetron **OR** ondansetron (ZOFRAN) IV, oxyCODONE, sodium chloride flush   PHYSICAL EXAM: Vital signs: Vitals:   01/05/20 1740 01/05/20 1742 01/05/20 2002 01/06/20 0424  BP: 137/73 137/73 139/82 131/73  Pulse: 70 70 77 64  Resp: 18 18 17 19   Temp: 98.4 F (36.9 C) 98.4 F (36.9 C) 98.3 F (36.8 C) 98 F (36.7 C)  TempSrc: Oral Oral Oral Oral  SpO2: 100% 100% 100% 100%  Weight:      Height:       Filed Weights   01/05/20 1524  Weight: 122.5 kg   Body mass index is 34.66 kg/m.   Gen Exam:Alert awake-not in any distress HEENT:atraumatic, normocephalic Chest: B/L clear to auscultation anteriorly CVS:S1S2 regular Abdomen:soft non tender, non distended Extremities:no edema Neurology: Non focal Skin: no rash  I have personally reviewed following labs and imaging studies  LABORATORY DATA: CBC: Recent Labs  Lab 01/05/20 0009 01/05/20 1210 01/05/20 1949 01/06/20 0343  WBC 6.1  --   --  6.6  HGB 6.9* 7.4* 8.4* 8.2*  HCT 21.3* 23.1* 26.0* 25.7*  MCV 90.6  --   --  93.1  PLT 220  --   --  [id number]    Basic Metabolic Panel: Recent Labs  Lab 01/05/20 0009 01/06/20 0343  NA 137 140  K 4.0 4.1  CL 104 105  CO2 26 28  GLUCOSE 249* 146*  BUN 9 11  CREATININE 1.14 0.98  CALCIUM 8.9 9.0    GFR:  Estimated Creatinine Clearance: 119.8 mL/min (by C-G formula based on SCr of 0.98 mg/dL).  Liver Function Tests: Recent Labs  Lab 01/06/20 0343  AST 12*  ALT 10  ALKPHOS 52  BILITOT 0.8  PROT 6.0*  ALBUMIN 2.9*   No results for input(s): LIPASE, AMYLASE in the last 168 hours. No results for input(s): AMMONIA in the last 168 hours.  Coagulation Profile: No results for input(s): INR, PROTIME in the last 168 hours.  Cardiac Enzymes: No results for input(s): CKTOTAL, CKMB, CKMBINDEX, TROPONINI in the last 168 hours.  BNP (last 3 results) No results for input(s): PROBNP in the last 8760 hours.  Lipid Profile: No results for input(s): CHOL, HDL, LDLCALC, TRIG, CHOLHDL, LDLDIRECT in the last 72 hours.  Thyroid Function Tests: No results for input(s): TSH, T4TOTAL, FREET4, T3FREE, THYROIDAB in the last 72 hours.  Anemia Panel: No results for input(s): VITAMINB12, FOLATE, FERRITIN, TIBC, IRON, RETICCTPCT in the last 72 hours.  Urine analysis:    Component  Value Date/Time   COLORURINE YELLOW 08/14/2019 1232   APPEARANCEUR CLEAR 08/14/2019 1232   LABSPEC 1.020 08/14/2019 1232   PHURINE 5.0 08/14/2019 1232   GLUCOSEU NEGATIVE 08/14/2019 1232   HGBUR NEGATIVE 08/14/2019 [place] 08/14/2019 1232   KETONESUR NEGATIVE 08/14/2019 1232   PROTEINUR NEGATIVE 08/14/2019 1232   UROBILINOGEN 4.0 (H) 10/09/2008 1114   NITRITE NEGATIVE 08/14/2019 1232   LEUKOCYTESUR NEGATIVE 08/14/2019 1232    Sepsis Labs: Lactic Acid, Venous    Component Value Date/Time   LATICACIDVEN 1.6 12/17/2019 1822    MICROBIOLOGY: No results found for this or any previous visit (from the past 240 hour(s)).  RADIOLOGY STUDIES/RESULTS: DG Foot 2 Views Left  Result Date: 01/04/2020 CLINICAL DATA:  Left foot pain and swelling with clinical evidence of infection. Status post amputation on 12/21/2019. EXAM: LEFT FOOT - 2 VIEW COMPARISON:  12/21/2019 FINDINGS: Again demonstrated surgical  absence of the mid and distal foot with anterior skin clips. More confluent in better defined soft tissue density anterior, superior and inferior to the amputation site. No soft tissue gas, bone destruction or periosteal reaction seen. Progressive diffuse soft tissue swelling. IMPRESSION: Increased soft tissue density anterior, superior and inferior to the amputation site with no radiographic evidence of osteomyelitis. The more confluent, increased soft tissue density could represent a postoperative hematoma or abscess with a non gas-forming organism. Electronically Signed   By: Claudie Revering M.D.   On: 01/04/2020 20:38     LOS: 2 days   Oren Binet, MD  [facility]    To contact the attending provider between 7A-7P or the covering provider during after hours 7P-7A, please log into the web site [email or web address] and access using universal [place] password for that web site. If you do not have the password, please call the hospital operator.  01/06/2020, 1:10 PM

## 2020-01-06 NOTE — Progress Notes (Signed)
Subjective:  Patient ID: Eugene Diaz, male    DOB: September 22, 1966,  MRN: [id number]  Chief Complaint  Patient presents with  . Wound Check    Left foot wound check. Pt denies fever/nausea/vomiting/chills.    54 y.o. male returns for post-op check.  Patient presents with follow-up postop Chopart amputation.  Patient states that it was bleeding the last couple of times.  He had to go multiple times to the emergency room given the amount of bleeding there was associated with it.  The dressings were taken down today and evaluated by me.  He denies any other acute complaints.  He states that he has been weightbearing as tolerated and has been noncompliant.  Review of Systems: Negative except as noted in the HPI. Denies N/V/F/Ch.  Past Medical History:  Diagnosis Date  . Alcohol abuse   . Alcoholic peripheral neuropathy ([place])   . Anemia   . Anxiety   . Arthritis   . Asthma    followed by pcp  . B12 deficiency   . Cellulitis of left lower leg    07-2019 and left foot  . Depression   . Diabetic neuropathy ([place])   . Essential tremor    neurologist-- dr Jennefer Kopp--- due to alcohol abuse  . GERD (gastroesophageal reflux disease)   . History of esophageal stricture    s/p  dilatation 02-13-2018  . Hypercholesterolemia   . Hypertension    followed by pcp  (nuclear stress test 03-11-2014 low risk w/ no ischemia, ef 65%  . Osteomyelitis of great toe ([place])    left foot s/p amputation 08-14-2019  . Peripheral vascular disease ([place])   . Thrombocytopenia ([facility])    chronic  . Type 2 diabetes mellitus ([place])    followed by pcp--- last A1c in epic 6.6 on 07-12-2019  . Wound dehiscence, surgical    post left great toe amputation 08-14-2019   No current facility-administered medications for this visit. No current outpatient medications on file.  Facility-Administered Medications Ordered in Other Visits:  .  0.9 %  sodium chloride infusion (Manually program via Guardrails IV Fluids), ,  Intravenous, Once, [place], Lolita Cram, NP, Stopped at 01/05/20 [phone] .  acetaminophen (TYLENOL) tablet 650 mg, 650 mg, Oral, Q6H PRN **OR** acetaminophen (TYLENOL) suppository 650 mg, 650 mg, Rectal, Q6H PRN, Lakea Mittelman, Vishal R, MD .  albuterol (PROVENTIL) (2.5 MG/3ML) 0.083% nebulizer solution 3 mL, 3 mL, Inhalation, Q6H PRN, Zada Finders R, MD .  ALPRAZolam Duanne Moron) tablet 1 mg, 1 mg, Oral, TID PRN, Lenore Cordia, MD, 1 mg at 01/05/20 2208 .  amitriptyline (ELAVIL) tablet 100 mg, 100 mg, Oral, QHS, Zada Finders R, MD, 100 mg at 01/05/20 2207 .  atorvastatin (LIPITOR) tablet 40 mg, 40 mg, Oral, QPM, Zada Finders R, MD, 40 mg at 01/05/20 1744 .  Chlorhexidine Gluconate Cloth 2 % PADS 6 each, 6 each, Topical, Daily, Lenore Cordia, MD, Stopped at 01/05/20 1038 .  DULoxetine (CYMBALTA) DR capsule 60 mg, 60 mg, Oral, Daily, Zada Finders R, MD, 60 mg at 01/06/20 1034 .  gabapentin (NEURONTIN) capsule 1,200 mg, 1,200 mg, Oral, QHS, Zada Finders R, MD, 1,200 mg at 01/05/20 2208 .  gabapentin (NEURONTIN) capsule 900 mg, 900 mg, Oral, 2 times per day, Zada Finders R, MD, 900 mg at 01/06/20 1252 .  hydrOXYzine (ATARAX/VISTARIL) tablet 25 mg, 25 mg, Oral, QID PRN, [name], Vishal R, MD .  insulin aspart (novoLOG) injection 0-9 Units, 0-9 Units, Subcutaneous, TID WC, Rayson Rando, Vishal R,  MD, 2 Units at 01/06/20 1252 .  lisinopril (ZESTRIL) tablet 20 mg, 20 mg, Oral, Daily, Zada Finders R, MD, 20 mg at 01/06/20 1035 .  ondansetron (ZOFRAN) tablet 4 mg, 4 mg, Oral, Q6H PRN **OR** ondansetron (ZOFRAN) injection 4 mg, 4 mg, Intravenous, Q6H PRN, [name], Vishal R, MD .  oxyCODONE (Oxy IR/ROXICODONE) immediate release tablet 10 mg, 10 mg, Oral, Q6H PRN, Lenore Cordia, MD, 10 mg at 01/06/20 1136 .  pantoprazole (PROTONIX) EC tablet 40 mg, 40 mg, Oral, Daily, Zada Finders R, MD, 40 mg at 01/06/20 1034 .  piperacillin-tazobactam (ZOSYN) IVPB 3.375 g, 3.375 g, Intravenous, Q8H, Lyndee Leo, RPH, Last Rate: 12.5 mL/hr  at 01/06/20 1433, 3.375 g at 01/06/20 1433 .  primidone (MYSOLINE) tablet 50 mg, 50 mg, Oral, BID, Zada Finders R, MD, 50 mg at 01/06/20 1035 .  sodium chloride flush (NS) 0.9 % injection 10-40 mL, 10-40 mL, Intracatheter, Q12H, Ninah Moccio, Vishal R, MD, 10 mL at 01/06/20 1038 .  sodium chloride flush (NS) 0.9 % injection 10-40 mL, 10-40 mL, Intracatheter, PRN, Lenore Cordia, MD  Social History   Tobacco Use  Smoking Status Former Smoker  . Packs/day: 2.50  . Years: 25.00  . Pack years: 62.50  . Types: Cigarettes  . Quit date: 08/05/1984  . Years since quitting: 35.4  Smokeless Tobacco Never Used    No Known Allergies Objective:  There were no vitals filed for this visit. There is no height or weight on file to calculate BMI. Constitutional Well developed. Well nourished.  Vascular Foot warm and well perfused. Capillary refill normal to all digits.   Neurologic Normal speech. Oriented to person, place, and time. Epicritic sensation to light touch grossly present bilaterally.  Dermatologic Skin healing well without signs of infection. Skin edges well coapted without signs of infection.  Orthopedic:  Mild no clinical signs of infection noted.  No hematoma noted.  No active bleeding noted.  Tenderness to palpation noted about the surgical site.   Radiographs: None Assessment:   1. Post-operative state   2. Non-compliance   3. DM type 2 with diabetic peripheral neuropathy ([place])    Plan:  Patient was evaluated and treated and all questions answered.  S/p foot surgery left -Progressing as expected post-operatively. -XR: None -WB Status: Nonweightbearing to the left lower extremity in a total contact cast -Sutures: Staples are intact without any signs of dehiscence.  No clinical signs of infection noted.  No active bleeding noted -Medications: None -Foot redressed. -Given the patient has been noncompliant with his ambulatory status I believe patient will benefit from a total  contact cast.  Patient agrees with the plan and would like to proceed with a total contact cast application.  Total contact cast was applied in standard manner.  Good padding and deficit noted.  Return in about 1 week (around 01/10/2020), or See [name].

## 2020-01-06 NOTE — Consult Note (Signed)
Surgical incision measuring 13.0 cm x 6.0 cm x 3.2 cm Cleansed wound with normal saline Periwound skin protected with skin barrier wipe Skin protected to the dorsum of the left foot with VAC drape for foam bridge  Filled wound with  3 piece of black foam,  1 piece of Mepitel over the staples Sealed NPWT dressing at 169mm HG/146mmHG  Patient tolerated procedure well with no complaints of pain or discomfort.  [place] nurse will continue to provide NPWT dressing changed due to the complexity of the dressing change.   Austin Miles, RN, [employer]

## 2020-01-06 NOTE — [place] FL2 (Signed)
[place] MEDICAID FL2 LEVEL OF CARE SCREENING TOOL     IDENTIFICATION  Patient Name: Eugene Diaz Birthdate: 30-Aug-1966 Sex: male Admission Date (Current Location): 01/04/2020  [facility] and [place] Number:  Herbalist and Address:  [facility] [place], [place] [address] [place], [place] [address]      Provider Number: [id number]  Attending Physician Name and Address:  Jonetta Osgood, MD  Relative Name and Phone Number:  [name] [phone]    Current Level of Care: Hospital Recommended Level of Care: [place] Prior Approval Number:    Date Approved/Denied:   PASRR Number: [id number]  Discharge Plan: SNF    Current Diagnoses: Patient Active Problem List   Diagnosis Date Noted  . Acute blood loss anemia 01/04/2020  . Asthma   . Depression with anxiety   . Osteomyelitis of ankle or foot, acute, left ([place]) 12/17/2019  . Hyperglycemia 12/17/2019  . Type 2 diabetes mellitus with foot ulcer ([place])   . Anemia, chronic disease 11/10/2019  . Acute osteomyelitis of left foot ([place]) 11/10/2019  . Marijuana use 11/10/2019  . Wound dehiscence 08/31/2019  . Osteomyelitis due to type 2 diabetes mellitus ([place]) 08/14/2019  . Acute osteomyelitis of toe of left foot ([place]) 08/14/2019  . Sepsis ([place]) 08/14/2019  . Hyponatremia 08/14/2019  . Normocytic anemia 08/14/2019  . B12 deficiency 08/14/2019  . Osteomyelitis of foot ([place])   . Cellulitis of toe, left   . Diabetic infection of left foot ([place])   . Toe fracture, left 07/13/2019  . Cellulitis 07/12/2019  . GERD (gastroesophageal reflux disease) 12/19/2018  . Hepatic steatosis 06/18/2018  . Gastritis, erosive   . Dysphagia, idiopathic 12/27/2017  . Rectal bleeding 12/27/2017  . Intention tremor 06/03/2016  . Left knee pain 12/04/2015  . Alcoholic peripheral neuropathy ([place]) 10/08/2014  . Diabetic neuropathy ([place]) 10/08/2014  . Chest pain, rule out acute myocardial infarction  03/10/2014  . Thrombocytopenia ([place]) 03/10/2014  . ETOH abuse 03/10/2014  . Chest pain 03/10/2014  . Diabetes mellitus without complication ([place])   . Hypertension associated with type 2 diabetes mellitus ([place])   . Hyperlipidemia associated with type 2 diabetes mellitus (HCC)     Orientation RESPIRATION BLADDER Height & Weight     Self, Time, Situation  Normal Continent Weight: 122.5 kg Height:  6' 2.02" (188 cm)  BEHAVIORAL SYMPTOMS/MOOD NEUROLOGICAL BOWEL NUTRITION STATUS      Continent Diet(REFER TO D/C SUMMARY)  AMBULATORY STATUS COMMUNICATION OF NEEDS Skin   Extensive Assist Verbally Wound Vac, Surgical wounds                       Personal Care Assistance Level of Assistance  Bathing, Feeding, Dressing Bathing Assistance: Maximum assistance Feeding assistance: Independent       Functional Limitations Info  Sight, Hearing, Speech Sight Info: Adequate   Speech Info: Adequate    SPECIAL CARE FACTORS FREQUENCY  PT (By licensed PT), OT (By licensed OT)     PT Frequency: 5x OT Frequency: 5x            Contractures Contractures Info: Not present    Additional Factors Info  Code Status, Allergies Code Status Info: full code Allergies Info: nka           Current Medications (01/06/2020):  This is the current hospital active medication list Current Facility-Administered Medications  Medication Dose Route Frequency Provider Last Rate Last Admin  . 0.9 %  sodium chloride infusion (Manually program via Guardrails IV Fluids)   Intravenous Once Neila Gear, NP   Stopped at 01/05/20 [phone]  . acetaminophen (TYLENOL) tablet 650 mg  650 mg Oral Q6H PRN Lenore Cordia, MD       Or  . acetaminophen (TYLENOL) suppository 650 mg  650 mg Rectal Q6H PRN Zada Finders R, MD      . albuterol (PROVENTIL) (2.5 MG/3ML) 0.083% nebulizer solution 3 mL  3 mL Inhalation Q6H PRN Lenore Cordia, MD      . ALPRAZolam Duanne Moron) tablet 1 mg  1 mg Oral TID PRN Lenore Cordia, MD    1 mg at 01/05/20 2208  . amitriptyline (ELAVIL) tablet 100 mg  100 mg Oral QHS Zada Finders R, MD   100 mg at 01/05/20 2207  . atorvastatin (LIPITOR) tablet 40 mg  40 mg Oral QPM Lenore Cordia, MD   40 mg at 01/05/20 1744  . Chlorhexidine Gluconate Cloth 2 % PADS 6 each  6 each Topical Daily Lenore Cordia, MD   Stopped at 01/05/20 1038  . DULoxetine (CYMBALTA) DR capsule 60 mg  60 mg Oral Daily Lenore Cordia, MD   60 mg at 01/06/20 1034  . gabapentin (NEURONTIN) capsule 1,200 mg  1,200 mg Oral QHS Zada Finders R, MD   1,200 mg at 01/05/20 2208  . gabapentin (NEURONTIN) capsule 900 mg  900 mg Oral 2 times per day Lenore Cordia, MD   900 mg at 01/06/20 1252  . hydrOXYzine (ATARAX/VISTARIL) tablet 25 mg  25 mg Oral QID PRN Zada Finders R, MD      . insulin aspart (novoLOG) injection 0-9 Units  0-9 Units Subcutaneous TID WC Lenore Cordia, MD   2 Units at 01/06/20 1252  . lisinopril (ZESTRIL) tablet 20 mg  20 mg Oral Daily Zada Finders R, MD   20 mg at 01/06/20 1035  . ondansetron (ZOFRAN) tablet 4 mg  4 mg Oral Q6H PRN Lenore Cordia, MD       Or  . ondansetron (ZOFRAN) injection 4 mg  4 mg Intravenous Q6H PRN Zada Finders R, MD      . oxyCODONE (Oxy IR/ROXICODONE) immediate release tablet 10 mg  10 mg Oral Q6H PRN Lenore Cordia, MD   10 mg at 01/06/20 1136  . pantoprazole (PROTONIX) EC tablet 40 mg  40 mg Oral Daily Lenore Cordia, MD   40 mg at 01/06/20 1034  . piperacillin-tazobactam (ZOSYN) IVPB 3.375 g  3.375 g Intravenous Q8H Lyndee Leo, RPH 12.5 mL/hr at 01/06/20 0559 3.375 g at 01/06/20 0559  . primidone (MYSOLINE) tablet 50 mg  50 mg Oral BID Lenore Cordia, MD   50 mg at 01/06/20 1035  . sodium chloride flush (NS) 0.9 % injection 10-40 mL  10-40 mL Intracatheter Q12H Zada Finders R, MD   10 mL at 01/06/20 1038  . sodium chloride flush (NS) 0.9 % injection 10-40 mL  10-40 mL Intracatheter PRN Lenore Cordia, MD         Discharge Medications: Please see discharge  summary for a list of discharge medications.  Relevant Imaging Results:  Relevant Lab Results:   Additional Information ss [id number], Zosyn: 2/16>> planned stop date 3/22  Sharin Mons, RN

## 2020-01-06 NOTE — TOC Initial Note (Addendum)
Transition of Care [facility]) - Initial/Assessment Note    Patient Details  Name: Eugene Diaz MRN: [id number] Date of Birth: [birth year]  [facility] CM/SW Contact:    Sharin Mons, RN Phone Number: 01/06/2020, 2:26 PM  Clinical Narrative:     Pt readmitted for anemia due to acute blood loss suspected from surgical site. 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 NCM received consult for possible SNF placement at time of discharge. NCM spoke with patient regarding PT recommendation of SNF placement at time of discharge. Patient expressed understanding of PT recommendation and is agreeable to SNF placement at time of discharge given his current physical needs and fall risk  Patient reports preference for [facility]. NCM discussed insurance authorization process and aware of  Medicare SNF ratings list 2/2 recent placement @ SNF. Patient expressed being hopeful for rehab and to feel better soon. No further questions reported at this time. RNCM to continue to follow and assist with discharge planning needs.  SNF workup completed. [facility] SNF accepted and will received pt on tomorrow. Updated COVID needed. MD / nurse aware.  Expected Discharge Plan: Skilled Nursing Facility Barriers to Discharge: Other (comment)   Patient Goals and CMS Choice        Expected Discharge Plan and Services Expected Discharge Plan: [place]      Prior Living Arrangements/Services          Activities of Daily Living Home Assistive Devices/Equipment: Cane (specify quad or straight) ADL Screening (condition at time of admission) Patient's cognitive ability adequate to safely complete daily activities?: Yes Is the patient deaf or have difficulty hearing?: No Does the patient have difficulty seeing, even when wearing glasses/contacts?: No Does the patient have difficulty concentrating, remembering, or making decisions?: No Patient able to express need for  assistance with ADLs?: Yes Does the patient have difficulty dressing or bathing?: No Independently performs ADLs?: Yes (appropriate for developmental age) Does the patient have difficulty walking or climbing stairs?: No Weakness of Legs: Left Weakness of Arms/Hands: None  Permission Sought/Granted                  Emotional Assessment              Admission diagnosis:  Osteomyelitis ([place]) [M86.9] Acute blood loss anemia [D62] Patient Active Problem List   Diagnosis Date Noted  . Acute blood loss anemia 01/04/2020  . Asthma   . Depression with anxiety   . Osteomyelitis of ankle or foot, acute, left ([place]) 12/17/2019  . Hyperglycemia 12/17/2019  . Type 2 diabetes mellitus with foot ulcer ([place])   . Anemia, chronic disease 11/10/2019  . Acute osteomyelitis of left foot ([place]) 11/10/2019  . Marijuana use 11/10/2019  . Wound dehiscence 08/31/2019  . Osteomyelitis due to type 2 diabetes mellitus ([place]) 08/14/2019  . Acute osteomyelitis of toe of left foot ([place]) 08/14/2019  . Sepsis ([place]) 08/14/2019  . Hyponatremia 08/14/2019  . Normocytic anemia 08/14/2019  . B12 deficiency 08/14/2019  . Osteomyelitis of foot ([place])   . Cellulitis of toe, left   . Diabetic infection of left foot ([place])   . Toe fracture, left 07/13/2019  . Cellulitis 07/12/2019  . GERD (gastroesophageal reflux disease) 12/19/2018  . Hepatic steatosis 06/18/2018  . Gastritis, erosive   . Dysphagia, idiopathic 12/27/2017  . Rectal bleeding 12/27/2017  . Intention tremor 06/03/2016  . Left knee pain 12/04/2015  . Alcoholic peripheral neuropathy ([place]) 10/08/2014  . Diabetic neuropathy (  [place]) 10/08/2014  . Chest pain, rule out acute myocardial infarction 03/10/2014  . Thrombocytopenia ([place]) 03/10/2014  . ETOH abuse 03/10/2014  . Chest pain 03/10/2014  . Diabetes mellitus without complication ([place])   . Hypertension associated with type 2 diabetes mellitus ([place])   . Hyperlipidemia associated with type 2  diabetes mellitus ([place])    PCP:  [name], PA-C Pharmacy:   [place], [place] [id number] [place] [id number] Phone: [phone] Fax: [phone]     Social Determinants of Health (SDOH) Interventions    Readmission Risk Interventions No flowsheet data found.

## 2020-01-06 NOTE — Plan of Care (Signed)
  Problem: Education: Goal: Knowledge of General Education information will improve Description: Including pain rating scale, medication(s)/side effects and non-pharmacologic comfort measures 01/06/2020 1210 by Melina Schools, RN Outcome: Progressing 01/06/2020 1210 by Melina Schools, RN Outcome: Progressing 01/06/2020 0753 by Melina Schools, RN Outcome: Progressing   Problem: Health Behavior/Discharge Planning: Goal: Ability to manage health-related needs will improve Outcome: Progressing

## 2020-01-06 NOTE — Evaluation (Addendum)
Occupational Therapy Evaluation Patient Details Name: Eugene Diaz MRN: [id number] DOB: 1965-12-12 Today's Date: 01/06/2020    History of Present Illness Eugene Diaz is a 54 y.o. 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  History is supplemented from chart review and outside hospital records. Patient was last admitted at [facility] from 12/17/2019-12/27/2019 for left foot osteomyelitis.  He was seen by podiatry and underwent debridement on 12/18/2019 and then Chopart amputation on 12/21/2019.  Wound cultures grew MSSA, E faecalis, and Enterobacter Cloacae.  ID recommended continuing 4 weeks of IV Zosyn through 01/20/2020.  Patient was discharged to SNF, [facility]. He presented to [facility] on 12/28/2019 for bleeding from his surgical site.  Hemoglobin was 6.6 at that time and he was transfused PRBC and discharged back to SNF.  He returned again on 12/29/2019 with bleeding from surgical site with hemoglobin 6.2.  He was admitted and transfused 2 units PRBCs and discharged on 01/01/2020 with a hemoglobin of 8.1.  He returned on 01/02/2020, again with bleeding from the surgical site.  Hemoglobin was 7.8.  Surgical consult was obtained and recommendation was nonweightbearing on the left lower extremity and follow-up with his primary surgeon.  He was discharged from the ED and again returned the following day, 01/03/2020.  Patient refused to return to the [facility] and he left the ED [place].   Clinical Impression   Pt agreeable to OT session this date. Pt reports poor adherence to weight bearing precautions stating "I need to get it out of my head that my boot isn't to help me walk, it's to help protect my foot from bumping into objects". Therapist thoroughly educated pt on importance of adherence to weight bearing precautions and  importance of elevating his LLE to promote healing process. Pt currently requires minguard for functional mobility at RW level and minA for stability with dynamic balance with single UE support. He donned his right shoe after setupA. Pt will continue to benefit from skilled OT services to maximize safety and independence with ADL/IADL and functional mobility. Will continue to follow acutely and progress as tolerated.    Spoke with [employer] coordinator, Claiborne Billings, to discuss possible CIR d/c. Due to pt's physical level of functioning, poor adherence to weight bearing status, and decreased available support at home, she does not feel pt is appropriate for CIR level therapies. Recommend SNF level therapy at d/c.     Follow Up Recommendations  SNF    Equipment Recommendations  3 in 1 bedside commode    Recommendations for Other Services Other (comment)(continue chaplain support services)     Precautions / Restrictions Precautions Precautions: Fall Restrictions Weight Bearing Restrictions: No LLE Weight Bearing: Non weight bearing Other Position/Activity Restrictions: OK to bear weight through L knee for knee scooter use per conversation with [name]      Mobility Bed Mobility               General bed mobility comments: pt sitting in recliner upon arrival  Transfers Overall transfer level: Needs assistance Equipment used: Rolling walker (2 wheeled) Transfers: Sit to/from Stand;Stand Pivot Transfers Sit to Stand: Min guard         General transfer comment: minguard for safety and NWB precautions    Balance Overall balance assessment: Needs assistance Sitting-balance support: Feet supported Sitting balance-Leahy Scale: Good     Standing balance support: Bilateral  upper extremity supported Standing balance-Leahy Scale: Poor Standing balance comment: reliant on BUE support for dynamic balance, minA for stabiltiy with single UE support                           ADL  either performed or assessed with clinical judgement   ADL Overall ADL's : Needs assistance/impaired Eating/Feeding: Independent;Sitting   Grooming: Set up;Sitting;Brushing hair Grooming Details (indicate cue type and reason): grooming while sitting EOB Upper Body Bathing: Set up;Sitting   Lower Body Bathing: Min guard Lower Body Bathing Details (indicate cue type and reason): for balance in standing Upper Body Dressing : Set up;Sitting   Lower Body Dressing: Sit to/from stand;Min guard Lower Body Dressing Details (indicate cue type and reason): minguard for safety Toilet Transfer: Min guard;Ambulation Toilet Transfer Details (indicate cue type and reason): minguard for stability and NWB  Toileting- Clothing Manipulation and Hygiene: Min guard;Sit to/from stand Toileting - Clothing Manipulation Details (indicate cue type and reason): minguard for safety     Functional mobility during ADLs: Min guard;Rolling walker General ADL Comments: decreased activity tolerance, required vc for adherence to NWB precaution     Vision Patient Visual Report: No change from baseline       Perception     Praxis      Pertinent Vitals/Pain Pain Assessment: Faces Faces Pain Scale: Hurts a little bit Pain Location: L foot Pain Descriptors / Indicators: Aching Pain Intervention(s): Limited activity within patient's tolerance;Monitored during session     Hand Dominance Right   Extremity/Trunk Assessment Upper Extremity Assessment Upper Extremity Assessment: Generalized weakness   Lower Extremity Assessment Lower Extremity Assessment: Generalized weakness;LLE deficits/detail LLE Deficits / Details: s/p chopart amputation;noted blood through coban, RN notified and aware   Cervical / Trunk Assessment Cervical / Trunk Assessment: Normal   Communication Communication Communication: No difficulties   Cognition Arousal/Alertness: Awake/alert Behavior During Therapy: WFL for tasks  assessed/performed Overall Cognitive Status: Within Functional Limits for tasks assessed                                 General Comments: Pt verbalized understanding to focus on mobility without WB due to patient attempting to put weight through LLE in standing first "to see how it will feel".   General Comments  noted blood through coban at surgical site on foot, RN notified and aware    Exercises Exercises: Other exercises Other Exercises Other Exercises: Thorough educatione on NWB status and importance of elevation for proper wound healing, pt verbalized understanding   Shoulder Instructions      Home Living Family/patient expects to be discharged to:: Skilled nursing facility Living Arrangements: Alone Available Help at Discharge: Friend(s);Family;Available PRN/intermittently Type of Home: Mobile home Home Access: Stairs to enter Entrance Stairs-Number of Steps: 1 Entrance Stairs-Rails: None Home Layout: One level     Bathroom Shower/Tub: Tub/shower unit;Curtain   Biochemist, clinical: Standard     Home Equipment: Cane - single point;Crutches;Walker - 2 wheels;Other (comment)(Knee scooter)   Additional Comments: pt's friend Grayland Ormond can assist him as needed      Prior Functioning/Environment Level of Independence: Independent with assistive device(s)        Comments: ambulates with either crutches or RW; washes up at that sink        OT Problem List: Decreased range of motion;Impaired balance (sitting and/or standing);Pain      OT Treatment/Interventions:  Self-care/ADL training;DME and/or AE instruction;Patient/family education;Balance training;Energy conservation;Therapeutic activities    OT Goals(Current goals can be found in the care plan section) Acute Rehab OT Goals Patient Stated Goal: to get stronger & go home OT Goal Formulation: With patient Time For Goal Achievement: 01/20/20 Potential to Achieve Goals: Good ADL Goals Pt Will Perform  Grooming: with modified independence;standing Pt Will Perform Lower Body Dressing: with modified independence;sit to/from stand Pt Will Transfer to Toilet: with modified independence;ambulating Additional ADL Goal #1: Pt will demonstrate independence with adherence to weight bearing precautions during functional mobility and ADL completion.  OT Frequency: Min 2X/week   Barriers to D/C: Decreased caregiver support          Co-evaluation              AM-PAC OT "6 Clicks" Daily Activity     Outcome Measure Help from another person eating meals?: None Help from another person taking care of personal grooming?: A Little Help from another person toileting, which includes using toliet, bedpan, or urinal?: A Little Help from another person bathing (including washing, rinsing, drying)?: A Little Help from another person to put on and taking off regular upper body clothing?: A Little Help from another person to put on and taking off regular lower body clothing?: A Little 6 Click Score: 19   End of Session Equipment Utilized During Treatment: Gait belt;Rolling walker Nurse Communication: Mobility status(bleeding from wound)  Activity Tolerance: Patient tolerated treatment well Patient left: in chair;with call bell/phone within reach;with chair alarm set  OT Visit Diagnosis: Unsteadiness on feet (R26.81);Other abnormalities of gait and mobility (R26.89);Pain Pain - Right/Left: Left Pain - part of body: Ankle and joints of foot                Time: 0929-1001 OT Time Calculation (min): 32 min Charges:  OT General Charges $OT Visit: 1 Visit OT Evaluation $OT Eval Moderate Complexity: 1 Mod OT Treatments $Self Care/Home Management : 8-22 mins  Dorinda Hill OTR/L Acute Rehabilitation Services Office: [phone]   Wyn Forster 01/06/2020, 11:48 AM

## 2020-01-06 NOTE — Progress Notes (Signed)
Wound Vac applied by [facility] nurse

## 2020-01-06 NOTE — Plan of Care (Signed)
  Problem: Education: Goal: Knowledge of General Education information will improve Description Including pain rating scale, medication(s)/side effects and non-pharmacologic comfort measures Outcome: Progressing   

## 2020-01-07 DIAGNOSIS — L97509 Non-pressure chronic ulcer of other part of unspecified foot with unspecified severity: Secondary | ICD-10-CM

## 2020-01-07 DIAGNOSIS — E11621 Type 2 diabetes mellitus with foot ulcer: Secondary | ICD-10-CM

## 2020-01-07 DIAGNOSIS — F418 Other specified anxiety disorders: Secondary | ICD-10-CM

## 2020-01-07 LAB — CBC
HCT: 25.6 % — ABNORMAL LOW (ref 39.0–52.0)
Hemoglobin: 8.1 g/dL — ABNORMAL LOW (ref 13.0–17.0)
MCH: 29.8 pg (ref 26.0–34.0)
MCHC: 31.6 g/dL (ref 30.0–36.0)
MCV: 94.1 fL (ref 80.0–100.0)
Platelets: 206 10*3/uL (ref 150–400)
RBC: 2.72 MIL/uL — ABNORMAL LOW (ref 4.22–5.81)
RDW: 15.9 % — ABNORMAL HIGH (ref 11.5–15.5)
WBC: 6.3 10*3/uL (ref 4.0–10.5)
nRBC: 0 % (ref 0.0–0.2)

## 2020-01-07 LAB — GLUCOSE, CAPILLARY: Glucose-Capillary: 195 mg/dL — ABNORMAL HIGH (ref 70–99)

## 2020-01-07 MED ORDER — ALPRAZOLAM 1 MG PO TABS: 1.0000 mg | ORAL_TABLET | Freq: Three times a day (TID) | ORAL | 0 refills | Status: DC | PRN

## 2020-01-07 MED ORDER — INSULIN ASPART 100 UNIT/ML ~~LOC~~ SOLN: SUBCUTANEOUS | 11 refills | Status: DC

## 2020-01-07 MED ORDER — OXYCODONE HCL 10 MG PO TABS: 10.0000 mg | ORAL_TABLET | Freq: Four times a day (QID) | ORAL | 0 refills | Status: DC | PRN

## 2020-01-07 MED ORDER — HEPARIN SOD (PORK) LOCK FLUSH 100 UNIT/ML IV SOLN
250.0000 [IU] | INTRAVENOUS | Status: AC | PRN
  Administered 2020-01-07: 250 [IU]
  Filled 2020-01-07: qty 2.5

## 2020-01-07 NOTE — TOC Transition Note (Addendum)
Transition of Care [facility]) - CM/SW Discharge Note   Patient Details  Name: Eugene Diaz MRN: [id number] Date of Birth: Oct 07, 1966  Transition of Care [facility]) CM/SW Contact:  Sharin Mons, RN Phone Number: [phone] 01/07/2020, 9:43 AM   Clinical Narrative:    Patient will DC to: [facility] Anticipated DC date: 01/07/2020 Family notified: pt states will notify family Transport by: Corey Harold   Per MD patient ready for DC today.RN, patient, patient's family, and facility notified of DC. Discharge Summary and FL2 sent to facility. RN to call report prior to discharge [phone]). DC packet on chart. Ambulance transport requested for patient.   RNCM will sign off for now as intervention is no longer needed. Please consult [place] again if new needs arise.    Final next level of care: Skilled Nursing Facility Barriers to Discharge: No Barriers Identified   Patient Goals and CMS Choice        Discharge Placement                       Discharge Plan and Services                                     Social Determinants of Health (SDOH) Interventions     Readmission Risk Interventions No flowsheet data found.

## 2020-01-07 NOTE — Discharge Instructions (Signed)
Wound Care:  Left foot. Copiously flush wound. Pack wound at medial aspect of foot with black foam sponge. Cover periwound with non-adherent dressing. Apply occlusive film, cut suction hole for trackpad. Set to 125 mmHg. Change thrice weekly - MWF or TRS.  Follow up with [name] for wound check Thursday or Friday. Please bring a sponge and the wound VAC machine for change. [phone] for appointment.

## 2020-01-07 NOTE — Progress Notes (Signed)
A discharge packet has been printed with follow up dates.  The packet will be sent via PTAR.

## 2020-01-07 NOTE — Progress Notes (Signed)
Connected to [facility] Portable Wound  Vac Tammy at [facility] is aware of wound vac. She reports they have ordered a vac but will arrive tomorrow.  Information booklet was sent

## 2020-01-07 NOTE — Discharge Summary (Signed)
PATIENT DETAILS Name: Eugene Diaz Age: 54 y.o. Sex: male Date of Birth: [birth year] MRN: [id number]. Admitting Physician: Lenore Cordia, MD NWG:NFAO, Carlean Jews, PA-C  Admit Date: 01/04/2020 Discharge date: 01/07/2020  Recommendations for Outpatient Follow-up:  1. Follow up with PCP in 1-2 weeks 2. Please obtain CMP/CBC in one week 3. Please follow up with Podiatry-Dr Price  Admitted From:  Home  Disposition: SNF   Home Health: No  Equipment/Devices: Wound vac  Discharge Condition: Stable  CODE STATUS: FULL CODE  Diet recommendation:  Diet Order            Diet - low sodium heart healthy        Diet Carb Modified        Diet heart healthy/carb modified Room service appropriate? Yes; Fluid consistency: Thin  Diet effective now               Brief Narrative: Patient is a 54 y.o. 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  See below.  Significant events: 2/16-2/26>> Admit for left foot osteomyelitis-s/p debridement on 2/17-then Chopart's amputation on 2/20.  Discharged to SNF on 4 weeks of IV Zosyn through 3/20.  2/27>> Presented to [facility] on 2/27 with bleeding from amputation site and hemoglobin of 6.6.  Discharged back to SNF after transfusion.  2/28>> presented to [facility]-bleeding from surgical site with hemoglobin of 6.2-admitted-given 2 units of PRBC and discharged on 3/3.  3/4>> presented to [facility] from surgical site-hemoglobin 7.8-surgical consult-nonweightbearing to left foot-follow with primary surgeon.  3/5>> presented to [facility] from surgical site-patient refused to return to SNF-and left AMA.  3/6>> return back to [facility]-bleeding from surgical  site-hemoglobin 5.9-transferred to [facility].2 units of PRBC transfused.  Antimicrobial therapy: Zosyn: 2/16>> planned stop date 3/20 Vancomycin: 2/16>> 2/22  Microbiology data: 2/16>> blood culture: Negative 2/17>> left foot culture-Enterobacter cloacae, MSSA, Enterococcus faecalis, bacteroids  Procedures : 2/20>> Chopart amputation of the left foot with wound VAC application 1/30>> debridement of left foot, partial excision of cuboid bone, and lateral cuneiform bone  Consults: [facility] Course: Left foot amputation site bleeding with blood loss anemia: Likely secondary to noncompliance with non weightbearing status. Dry dressing this morning-s/p transfusion of 2 units of PRBC.  Hemoglobin stable at 8.1.    After discussion with podiatry-no surgical intervention needed.  Has been counseled numerous times regarding strict nonweightbearing status of his left lower extremity.  Stable for discharge back to SNF today.  Please ensure follow-up with [name].  Left foot osteomyelitis-s/p Chopart amputation: Remains on IV Zosyn-planned stop date of 3/20   DM-2 (A1c 6.4 on 11/10/2019): CBGs stable with SSI-resume glimepiride on discharge.  HTN: Controlled-continue lisinopril.  HLD: Continue Lipitor  Essential tremor: Stable-continue primidone  Anxiety/depression: Appears stable-continue Cymbalta and Elavil.  On prn hydroxyzine and Xanax.  Asthma: No flare-continue inhalers  Obesity: Estimated body mass index is 34.66 kg/m as calculated from the following:   Height as of this encounter: 6' 2.02" (1.88 m).   Weight as of this encounter: 122.5 kg.   Obesity: Estimated body mass index is 34.66 kg/m as calculated from the following:   Height as of this encounter: 6' 2.02" (1.88 m).   Weight as of this encounter:  122.5 kg.     Discharge Diagnoses:  Principal Problem:   Acute blood loss anemia Active Problems:   Hypertension associated with type 2  diabetes mellitus ([place])   Hyperlipidemia associated with type 2 diabetes mellitus (HCC)   Acute osteomyelitis of left foot (HCC)   Type 2 diabetes mellitus with foot ulcer ([place])   Asthma   Depression with anxiety   Discharge Instructions:  Activity:  Nonweightbearing to left lower extremity  Discharge Instructions    Call MD for:  redness, tenderness, or signs of infection (pain, swelling, redness, odor or green/yellow discharge around incision site)   Complete by: As directed    Diet - low sodium heart healthy   Complete by: As directed    Diet Carb Modified   Complete by: As directed    Discharge instructions   Complete by: As directed    Follow with Primary MD  [name], PA-C in 1-2 weeks  Follow with Podiatry as instructed.  Check CBG's ac and hs  Please get a complete blood count and chemistry panel checked by your Primary MD at your next visit, and again as instructed by your Primary MD.  Get Medicines reviewed and adjusted: Please take all your medications with you for your next visit with your Primary MD  Laboratory/radiological data: Please request your Primary MD to go over all hospital tests and procedure/radiological results at the follow up, please ask your Primary MD to get all Hospital records sent to his/her office.  In some cases, they will be blood work, cultures and biopsy results pending at the time of your discharge. Please request that your primary care M.D. follows up on these results.  Also Note the following: If you experience worsening of your admission symptoms, develop shortness of breath, life threatening emergency, suicidal or homicidal thoughts you must seek medical attention immediately by calling 911 or calling your MD immediately  if symptoms less severe.  You must read complete instructions/literature along with all the possible adverse reactions/side effects for all the Medicines you take and that have been prescribed to you. Take  any new Medicines after you have completely understood and accpet all the possible adverse reactions/side effects.   Do not drive when taking Pain medications or sleeping medications (Benzodaizepines)  Do not take more than prescribed Pain, Sleep and Anxiety Medications. It is not advisable to combine anxiety,sleep and pain medications without talking with your primary care practitioner  Special Instructions: If you have smoked or chewed Tobacco  in the last 2 yrs please stop smoking, stop any regular Alcohol  and or any Recreational drug use.  Wear Seat belts while driving.  Please note: You were cared for by a hospitalist during your hospital stay. Once you are discharged, your primary care physician will handle any further medical issues. Please note that NO REFILLS for any discharge medications will be authorized once you are discharged, as it is imperative that you return to your primary care physician (or establish a relationship with a primary care physician if you do not have one) for your post hospital discharge needs so that they can reassess your need for medications and monitor your lab values.   Left foot. Copiously flush wound. Pack wound at medial aspect of foot with black foam sponge. Cover periwound with non-adherent dressing. Apply occlusive film, cut suction hole for trackpad. Set to 125 mmHg. Change thrice weekly - MWF or TRS.  Follow up with [name] for wound check Thursday or  Friday. Please bring a sponge and the wound VAC machine for change. [phone] for appointment.   Increase activity slowly   Complete by: As directed    Not weightbearing to left lower ext     Allergies as of 01/07/2020   No Known Allergies     Medication List    TAKE these medications   acetaminophen 325 MG tablet Commonly known as: TYLENOL Take 2 tablets (650 mg total) by mouth every 6 (six) hours as needed for mild pain (or Fever >/= 101).   albuterol 108 (90 Base) MCG/ACT  inhaler Commonly known as: VENTOLIN HFA Inhale 2 puffs into the lungs every 6 (six) hours as needed for wheezing or shortness of breath.   ALPRAZolam 1 MG tablet Commonly known as: XANAX Take 1 tablet (1 mg total) by mouth 3 (three) times daily as needed for anxiety.   amitriptyline 100 MG tablet Commonly known as: ELAVIL Take 100 mg by mouth at bedtime.   atorvastatin 40 MG tablet Commonly known as: LIPITOR Take 40 mg by mouth every evening.   docusate sodium 100 MG capsule Commonly known as: COLACE Take 100 mg by mouth daily as needed for mild constipation.   DULoxetine 60 MG capsule Commonly known as: CYMBALTA Take 60 mg by mouth daily.   ferrous sulfate 325 (65 FE) MG tablet Take 1 tablet (325 mg total) by mouth daily.   gabapentin 300 MG capsule Commonly known as: NEURONTIN Take '900mg'$  in the morning and at noon, then take '1200mg'$  at bedtime What changed:   how much to take  how to take this  when to take this   glimepiride 4 MG tablet Commonly known as: AMARYL Take 4 mg by mouth daily.   glycopyrrolate 1 MG tablet Commonly known as: ROBINUL Take 1 mg by mouth 2 (two) times daily.   hydrOXYzine 25 MG tablet Commonly known as: ATARAX/VISTARIL Take 25 mg by mouth 4 (four) times daily as needed for anxiety.   ibuprofen 400 MG tablet Commonly known as: ADVIL Take 1 tablet (400 mg total) by mouth every 8 (eight) hours as needed for moderate pain.   insulin aspart 100 UNIT/ML injection Commonly known as: novoLOG 0-9 Units, Subcutaneous, 3 times daily with meals CBG < 70: Implement Hypoglycemia measures, call MD CBG 70 - 120: 0 units CBG 121 - 150: 1 unit CBG 151 - 200: 2 units CBG 201 - 250: 3 units CBG 251 - 300: 5 units CBG 301 - 350: 7 units CBG 351 - 400: 9 units CBG > 400: call MD   lisinopril 20 MG tablet Commonly known as: ZESTRIL Take 20 mg by mouth daily.   omeprazole 20 MG capsule Commonly known as: PRILOSEC TAKE 1 CAPSULE WITH BREAKFAST  AND SUPPER. What changed: See the new instructions.   Oxycodone HCl 10 MG Tabs Take 1 tablet (10 mg total) by mouth every 6 (six) hours as needed. What changed: reasons to take this   piperacillin-tazobactam  IVPB Commonly known as: ZOSYN Inject 3.375 g into the vein every 6 (six) hours for 23 days. Indication:  Left foot osteomyelitis Last Day of Therapy:  01/18/2020 Labs - Once weekly:  CBC/D and BMP, Labs - Every other week:  ESR and CRP   primidone 50 MG tablet Commonly known as: MYSOLINE Take 1 tablet (50 mg total) by mouth 2 (two) times daily.   Voltaren 1 % Gel Generic drug: diclofenac Sodium Apply 2 g topically 4 (four) times daily as needed (for pain).  Follow-up Information    [name], PA-C. Schedule an appointment as soon as possible for a visit in 1 week(s).   Specialties: Physician Assistant, Internal Medicine Contact information: [place] [address] [phone]        Evelina Bucy, DPM. Schedule an appointment as soon as possible for a visit on 01/09/2020.   Specialty: Podiatry Why: Follow up with [name] for wound check Thursday or Friday. Please bring a sponge and the wound VAC machine for change. [phone] for appointment. Contact information: [address] [address] [phone]          No Known Allergies   Consultations:   Podiatry  Other Procedures/Studies: DG Foot 2 Views Left  Result Date: 01/04/2020 CLINICAL DATA:  Left foot pain and swelling with clinical evidence of infection. Status post amputation on 12/21/2019. EXAM: LEFT FOOT - 2 VIEW COMPARISON:  12/21/2019 FINDINGS: Again demonstrated surgical absence of the mid and distal foot with anterior skin clips. More confluent in better defined soft tissue density anterior, superior and inferior to the amputation site. No soft tissue gas, bone destruction or periosteal reaction seen. Progressive diffuse soft tissue swelling.  IMPRESSION: Increased soft tissue density anterior, superior and inferior to the amputation site with no radiographic evidence of osteomyelitis. The more confluent, increased soft tissue density could represent a postoperative hematoma or abscess with a non gas-forming organism. Electronically Signed   By: Claudie Revering M.D.   On: 01/04/2020 20:38   DG Foot 2 Views Left  Result Date: 12/21/2019 CLINICAL DATA:  Reason for exam: post-op state EXAM: LEFT FOOT - 2 VIEW COMPARISON:  12/17/2019 and previous FINDINGS: There has been interval amputation just distal to the calcaneus and talus. Skin staples. There is some indistinctness of the distal cortical margin of the calcaneus. IMPRESSION: Postop changes of left foot amputation as above. Indistinctness of distal calcaneus cortex, cannot exclude osteomyelitis. MR may be useful for further evaluation. Electronically Signed   By: Lucrezia Europe M.D.   On: 12/21/2019 10:49   DG Foot Complete Left  Result Date: 12/17/2019 CLINICAL DATA:  Infection.  Rule out osteo. EXAM: LEFT FOOT - COMPLETE 3+ VIEW COMPARISON:  11/13/2019 FINDINGS: Midfoot amputation. Progression of extensive soft tissue swelling. Cortical erosions are seen in the cuneiform bones suggesting osteomyelitis. Possible erosive changes in the cuboid bone. Soft tissue calcification is present which appears dystrophic. IMPRESSION: Midfoot amputation. Progressive soft tissue swelling and cortical erosion of the cuneiforms and possibly cuboid bones compatible with osteomyelitis and soft tissue infection. Electronically Signed   By: Franchot Gallo M.D.   On: 12/17/2019 19:12   [place] EKG SITE RITE  Result Date: 12/26/2019 If [facility] image not attached, placement could not be confirmed due to current cardiac rhythm.    TODAY-DAY OF DISCHARGE:  Subjective:   Eugene Diaz today has no headache,no chest abdominal pain,no new weakness tingling or numbness, feels much better wants to go home today.    Objective:   Blood pressure (!) 146/81, pulse 68, temperature 97.7 F (36.5 C), temperature source Oral, resp. rate 18, height 6' 2.02" (1.88 m), weight 122.5 kg, SpO2 99 %.  Intake/Output Summary (Last 24 hours) at 01/07/2020 0916 Last data filed at 01/07/2020 0700 Gross per 24 hour  Intake 840 ml  Output 6075 ml  Net -5235 ml   Filed Weights   01/05/20 1524  Weight: 122.5 kg    Exam: Awake Alert, Oriented *3, No new F.N  deficits, Normal affect [place].AT,PERRAL Supple Neck,No JVD, No cervical lymphadenopathy appriciated.  Symmetrical Chest wall movement, Good air movement bilaterally, CTAB RRR,No Gallops,Rubs or new Murmurs, No Parasternal Heave +ve B.Sounds, Abd Soft, Non tender, No organomegaly appriciated, No rebound -guarding or rigidity. No Cyanosis, Clubbing or edema, No new Rash or bruise   PERTINENT RADIOLOGIC STUDIES: No results found.   PERTINENT LAB RESULTS: CBC: Recent Labs    01/06/20 0343 01/07/20 0326  WBC 6.6 6.3  HGB 8.2* 8.1*  HCT 25.7* 25.6*  PLT 187 206   CMET CMP     Component Value Date/Time   NA 140 01/06/2020 0343   K 4.1 01/06/2020 0343   CL 105 01/06/2020 0343   CO2 28 01/06/2020 0343   GLUCOSE 146 (H) 01/06/2020 0343   BUN 11 01/06/2020 0343   CREATININE 0.98 01/06/2020 0343   CALCIUM 9.0 01/06/2020 0343   PROT 6.0 (L) 01/06/2020 0343   ALBUMIN 2.9 (L) 01/06/2020 0343   AST 12 (L) 01/06/2020 0343   ALT 10 01/06/2020 0343   ALKPHOS 52 01/06/2020 0343   BILITOT 0.8 01/06/2020 0343   GFRNONAA >60 01/06/2020 0343   GFRAA >60 01/06/2020 0343    GFR Estimated Creatinine Clearance: 119.8 mL/min (by C-G formula based on SCr of 0.98 mg/dL). No results for input(s): LIPASE, AMYLASE in the last 72 hours. No results for input(s): CKTOTAL, CKMB, CKMBINDEX, TROPONINI in the last 72 hours. Invalid input(s): POCBNP No results for input(s): DDIMER in the last 72 hours. No results for input(s): HGBA1C in the last 72 hours. No results for  input(s): CHOL, HDL, LDLCALC, TRIG, CHOLHDL, LDLDIRECT in the last 72 hours. No results for input(s): TSH, T4TOTAL, T3FREE, THYROIDAB in the last 72 hours.  Invalid input(s): FREET3 No results for input(s): VITAMINB12, FOLATE, FERRITIN, TIBC, IRON, RETICCTPCT in the last 72 hours. Coags: No results for input(s): INR in the last 72 hours.  Invalid input(s): PT Microbiology: Recent Results (from the past 240 hour(s))  SARS CORONAVIRUS 2 (TAT 6-24 HRS) Nasopharyngeal Nasopharyngeal Swab     Status: None   Collection Time: 01/06/20  3:17 PM   Specimen: Nasopharyngeal Swab  Result Value Ref Range Status   SARS Coronavirus 2 NEGATIVE NEGATIVE Final    Comment: (NOTE) SARS-CoV-2 target nucleic acids are NOT DETECTED. The SARS-CoV-2 RNA is generally detectable in upper and lower respiratory specimens during the acute phase of infection. Negative results do not preclude SARS-CoV-2 infection, do not rule out co-infections with other pathogens, and should not be used as the sole basis for treatment or other patient management decisions. Negative results must be combined with clinical observations, patient history, and epidemiological information. The expected result is Negative. Fact Sheet for Patients: SugarRoll.be Fact Sheet for Healthcare Providers: https://www.woods-mathews.com/ This test is not yet approved or cleared by the [place] FDA and  has been authorized for detection and/or diagnosis of SARS-CoV-2 by FDA under an Emergency Use Authorization (EUA). This EUA will remain  in effect (meaning this test can be used) for the duration of the COVID-19 declaration under Section 56 4(b)(1) of the Act, 21 U.S.C. section 360bbb-3(b)(1), unless the authorization is terminated or revoked sooner. Performed at [facility], [place] [address], [place],  [address]     FURTHER DISCHARGE INSTRUCTIONS:  Get Medicines reviewed and  adjusted: Please take all your medications with you for your next visit with your Primary MD  Laboratory/radiological data: Please request your Primary MD to go over all hospital tests and  procedure/radiological results at the follow up, please ask your Primary MD to get all Hospital records sent to his/her office.  In some cases, they will be blood work, cultures and biopsy results pending at the time of your discharge. Please request that your primary care M.D. goes through all the records of your hospital data and follows up on these results.  Also Note the following: If you experience worsening of your admission symptoms, develop shortness of breath, life threatening emergency, suicidal or homicidal thoughts you must seek medical attention immediately by calling 911 or calling your MD immediately  if symptoms less severe.  You must read complete instructions/literature along with all the possible adverse reactions/side effects for all the Medicines you take and that have been prescribed to you. Take any new Medicines after you have completely understood and accpet all the possible adverse reactions/side effects.   Do not drive when taking Pain medications or sleeping medications (Benzodaizepines)  Do not take more than prescribed Pain, Sleep and Anxiety Medications. It is not advisable to combine anxiety,sleep and pain medications without talking with your primary care practitioner  Special Instructions: If you have smoked or chewed Tobacco  in the last 2 yrs please stop smoking, stop any regular Alcohol  and or any Recreational drug use.  Wear Seat belts while driving.  Please note: You were cared for by a hospitalist during your hospital stay. Once you are discharged, your primary care physician will handle any further medical issues. Please note that NO REFILLS for any discharge medications will be authorized once you are discharged, as it is imperative that you return to your primary  care physician (or establish a relationship with a primary care physician if you do not have one) for your post hospital discharge needs so that they can reassess your need for medications and monitor your lab values.  Total Time spent coordinating discharge including counseling, education and face to face time equals 35 minutes.  SignedOren Binet 01/07/2020 9:16 AM

## 2020-01-07 NOTE — Plan of Care (Signed)

## 2020-01-07 NOTE — Progress Notes (Signed)
Report given to Tammy at [facility] in [place]. She has cared for the patient in the past. Pick up scheduled for 11 am

## 2020-01-10 ENCOUNTER — Other Ambulatory Visit: Payer: Self-pay

## 2020-01-10 ENCOUNTER — Ambulatory Visit: Payer: Medicaid Other | Admitting: Podiatry

## 2020-01-10 DIAGNOSIS — Z9889 Other specified postprocedural states: Secondary | ICD-10-CM

## 2020-01-15 NOTE — Telephone Encounter (Signed)
Pt's sister, [name] called states pt checked out of the [facility] on 01/13/2020, with IV supplies and she didn't think he was receiving the IV at [facility].

## 2020-01-15 NOTE — Telephone Encounter (Addendum)
Roann states she will check with [facility] and call with information. Olin Hauser states [facility] nurses can do wound care possibly once weekly.

## 2020-01-15 NOTE — Telephone Encounter (Signed)
Left message requesting a call to discuss if pt was established with [facility].

## 2020-01-15 NOTE — Telephone Encounter (Signed)
[name] states [place] may know [facility] agency. I spoke with [facility] and she states pt's left [place], 3 [place] agencies that accept Medicaid will not accept him due to either hx of noncompliance with their agency or lack of staffing.

## 2020-01-15 NOTE — Telephone Encounter (Signed)
Left message for [facility] - Olin Hauser to call to confirm if pt was in their care.

## 2020-01-15 NOTE — Telephone Encounter (Signed)
Patient's sister is calling to inform that the patient has moved from [facility] on 01/13/20 and has IV antibiotic medication at home but it is not in his arm. Could someone start working on this right away? Please call [phone]

## 2020-01-16 NOTE — Telephone Encounter (Signed)
So there has been an important change since he was last in the office. We will need to get [place] to come out and administer his IV Abx. He left his SNF AMA. He will also need wound care - betadine WTD dressing thrice weekly. He will need home PT for help with NWB as well.

## 2020-01-16 NOTE — Telephone Encounter (Signed)
I would talk to [name] about this patient. I am not up to date with the most current information.

## 2020-01-17 ENCOUNTER — Inpatient Hospital Stay (HOSPITAL_COMMUNITY)
Admission: EM | Admit: 2020-01-17 | Discharge: 2020-01-21 | DRG: 872 | Disposition: A | Discharge status: Home Health | Payer: Medicaid Other | Source: Ambulatory Visit | Attending: Internal Medicine | Admitting: Internal Medicine

## 2020-01-17 ENCOUNTER — Encounter: Payer: Self-pay | Admitting: Podiatry

## 2020-01-17 ENCOUNTER — Emergency Department (HOSPITAL_COMMUNITY): Payer: Medicaid Other

## 2020-01-17 ENCOUNTER — Encounter (HOSPITAL_COMMUNITY): Payer: Self-pay | Admitting: Emergency Medicine

## 2020-01-17 ENCOUNTER — Other Ambulatory Visit: Payer: Self-pay

## 2020-01-17 ENCOUNTER — Ambulatory Visit (INDEPENDENT_AMBULATORY_CARE_PROVIDER_SITE_OTHER): Payer: Self-pay | Admitting: Podiatry

## 2020-01-17 VITALS — BP 154/83 | HR 119 | Temp 103.6°F

## 2020-01-17 DIAGNOSIS — L03116 Cellulitis of left lower limb: Secondary | ICD-10-CM | POA: Diagnosis present

## 2020-01-17 DIAGNOSIS — E1165 Type 2 diabetes mellitus with hyperglycemia: Secondary | ICD-10-CM | POA: Diagnosis present

## 2020-01-17 DIAGNOSIS — E1140 Type 2 diabetes mellitus with diabetic neuropathy, unspecified: Secondary | ICD-10-CM | POA: Diagnosis present

## 2020-01-17 DIAGNOSIS — A419 Sepsis, unspecified organism: Principal | ICD-10-CM | POA: Diagnosis present

## 2020-01-17 DIAGNOSIS — Z89432 Acquired absence of left foot: Secondary | ICD-10-CM | POA: Diagnosis not present

## 2020-01-17 DIAGNOSIS — Z87891 Personal history of nicotine dependence: Secondary | ICD-10-CM | POA: Diagnosis not present

## 2020-01-17 DIAGNOSIS — T360X6A Underdosing of penicillins, initial encounter: Secondary | ICD-10-CM | POA: Diagnosis present

## 2020-01-17 DIAGNOSIS — F1010 Alcohol abuse, uncomplicated: Secondary | ICD-10-CM | POA: Diagnosis present

## 2020-01-17 DIAGNOSIS — E1151 Type 2 diabetes mellitus with diabetic peripheral angiopathy without gangrene: Secondary | ICD-10-CM | POA: Diagnosis present

## 2020-01-17 DIAGNOSIS — Z9889 Other specified postprocedural states: Secondary | ICD-10-CM

## 2020-01-17 DIAGNOSIS — Z8249 Family history of ischemic heart disease and other diseases of the circulatory system: Secondary | ICD-10-CM

## 2020-01-17 DIAGNOSIS — J449 Chronic obstructive pulmonary disease, unspecified: Secondary | ICD-10-CM | POA: Diagnosis present

## 2020-01-17 DIAGNOSIS — M7989 Other specified soft tissue disorders: Secondary | ICD-10-CM | POA: Diagnosis not present

## 2020-01-17 DIAGNOSIS — Z9119 Patient's noncompliance with other medical treatment and regimen: Secondary | ICD-10-CM | POA: Diagnosis not present

## 2020-01-17 DIAGNOSIS — G250 Essential tremor: Secondary | ICD-10-CM | POA: Diagnosis present

## 2020-01-17 DIAGNOSIS — E1152 Type 2 diabetes mellitus with diabetic peripheral angiopathy with gangrene: Secondary | ICD-10-CM | POA: Diagnosis not present

## 2020-01-17 DIAGNOSIS — Z91128 Patient's intentional underdosing of medication regimen for other reason: Secondary | ICD-10-CM

## 2020-01-17 DIAGNOSIS — I10 Essential (primary) hypertension: Secondary | ICD-10-CM | POA: Diagnosis present

## 2020-01-17 DIAGNOSIS — E785 Hyperlipidemia, unspecified: Secondary | ICD-10-CM | POA: Diagnosis present

## 2020-01-17 DIAGNOSIS — Z20822 Contact with and (suspected) exposure to covid-19: Secondary | ICD-10-CM | POA: Diagnosis present

## 2020-01-17 DIAGNOSIS — Z91199 Patient's noncompliance with other medical treatment and regimen due to unspecified reason: Secondary | ICD-10-CM

## 2020-01-17 DIAGNOSIS — E1142 Type 2 diabetes mellitus with diabetic polyneuropathy: Secondary | ICD-10-CM

## 2020-01-17 LAB — COMPREHENSIVE METABOLIC PANEL
ALT: 10 U/L (ref 0–44)
AST: 14 U/L — ABNORMAL LOW (ref 15–41)
Albumin: 3.3 g/dL — ABNORMAL LOW (ref 3.5–5.0)
Alkaline Phosphatase: 62 U/L (ref 38–126)
Anion gap: 12 (ref 5–15)
BUN: 16 mg/dL (ref 6–20)
CO2: 24 mmol/L (ref 22–32)
Calcium: 9.2 mg/dL (ref 8.9–10.3)
Chloride: 99 mmol/L (ref 98–111)
Creatinine, Ser: 1.26 mg/dL — ABNORMAL HIGH (ref 0.61–1.24)
GFR calc Af Amer: >60 mL/min (ref >60)
GFR calc non Af Amer: >60 mL/min (ref >60)
Glucose, Bld: 194 mg/dL — ABNORMAL HIGH (ref 70–99)
Potassium: 4.5 mmol/L (ref 3.5–5.1)
Sodium: 135 mmol/L (ref 135–145)
Total Bilirubin: 1.1 mg/dL (ref 0.3–1.2)
Total Protein: 6.9 g/dL (ref 6.5–8.1)

## 2020-01-17 LAB — CBC WITH DIFFERENTIAL/PLATELET
Abs Immature Granulocytes: 0.01 10*3/uL (ref 0.00–0.07)
Basophils Absolute: 0 10*3/uL (ref 0.0–0.1)
Basophils Relative: 1 %
Eosinophils Absolute: 0.1 10*3/uL (ref 0.0–0.5)
Eosinophils Relative: 2 %
HCT: 30.9 % — ABNORMAL LOW (ref 39.0–52.0)
Hemoglobin: 9.7 g/dL — ABNORMAL LOW (ref 13.0–17.0)
Immature Granulocytes: 0 %
Lymphocytes Relative: 12 %
Lymphs Abs: 0.8 10*3/uL (ref 0.7–4.0)
MCH: 29.8 pg (ref 26.0–34.0)
MCHC: 31.4 g/dL (ref 30.0–36.0)
MCV: 94.8 fL (ref 80.0–100.0)
Monocytes Absolute: 0.4 10*3/uL (ref 0.1–1.0)
Monocytes Relative: 6 %
Neutro Abs: 5.2 10*3/uL (ref 1.7–7.7)
Neutrophils Relative %: 79 %
Platelets: 175 10*3/uL (ref 150–400)
RBC: 3.26 MIL/uL — ABNORMAL LOW (ref 4.22–5.81)
RDW: 14.3 % (ref 11.5–15.5)
WBC: 6.5 10*3/uL (ref 4.0–10.5)
nRBC: 0 % (ref 0.0–0.2)

## 2020-01-17 LAB — APTT: aPTT: 33 seconds (ref 24–36)

## 2020-01-17 LAB — PROTIME-INR
INR: 1.1 (ref 0.8–1.2)
Prothrombin Time: 14 seconds (ref 11.4–15.2)

## 2020-01-17 LAB — SARS CORONAVIRUS 2 (TAT 6-24 HRS): SARS Coronavirus 2: NEGATIVE

## 2020-01-17 LAB — LACTIC ACID, PLASMA: Lactic Acid, Venous: 1.3 mmol/L (ref 0.5–1.9)

## 2020-01-17 MED ORDER — ADULT MULTIVITAMIN W/MINERALS CH
1.0000 | ORAL_TABLET | Freq: Every day | ORAL | Status: DC
  Administered 2020-01-19 – 2020-01-21 (×3): 1 via ORAL
  Filled 2020-01-17 (×4): qty 1

## 2020-01-17 MED ORDER — DULOXETINE HCL 60 MG PO CPEP
60.0000 mg | ORAL_CAPSULE | Freq: Every day | ORAL | Status: DC
  Administered 2020-01-18 – 2020-01-21 (×4): 60 mg via ORAL
  Filled 2020-01-17 (×4): qty 1

## 2020-01-17 MED ORDER — PRIMIDONE 50 MG PO TABS
50.0000 mg | ORAL_TABLET | Freq: Two times a day (BID) | ORAL | Status: DC
  Administered 2020-01-18 – 2020-01-21 (×8): 50 mg via ORAL
  Filled 2020-01-17 (×9): qty 1

## 2020-01-17 MED ORDER — LORAZEPAM 2 MG/ML IJ SOLN: 1.0000 mg | INTRAMUSCULAR | Status: AC | PRN

## 2020-01-17 MED ORDER — ALBUTEROL SULFATE (2.5 MG/3ML) 0.083% IN NEBU: 3.0000 mL | INHALATION_SOLUTION | Freq: Four times a day (QID) | RESPIRATORY_TRACT | Status: DC | PRN

## 2020-01-17 MED ORDER — AMITRIPTYLINE HCL 50 MG PO TABS
100.0000 mg | ORAL_TABLET | Freq: Every day | ORAL | Status: DC
  Administered 2020-01-18 – 2020-01-20 (×4): 100 mg via ORAL
  Filled 2020-01-17 (×4): qty 2

## 2020-01-17 MED ORDER — ACETAMINOPHEN 325 MG PO TABS
325.0000 mg | ORAL_TABLET | Freq: Once | ORAL | Status: AC
  Administered 2020-01-17: 17:00:00 325 mg via ORAL
  Filled 2020-01-17: qty 1

## 2020-01-17 MED ORDER — ACETAMINOPHEN 325 MG PO TABS
650.0000 mg | ORAL_TABLET | Freq: Four times a day (QID) | ORAL | Status: DC | PRN
  Administered 2020-01-18: 650 mg via ORAL
  Filled 2020-01-17: qty 2

## 2020-01-17 MED ORDER — ATORVASTATIN CALCIUM 40 MG PO TABS
40.0000 mg | ORAL_TABLET | Freq: Every evening | ORAL | Status: DC
  Administered 2020-01-18 – 2020-01-20 (×3): 40 mg via ORAL
  Filled 2020-01-17 (×3): qty 1

## 2020-01-17 MED ORDER — THIAMINE HCL 100 MG/ML IJ SOLN
100.0000 mg | Freq: Every day | INTRAMUSCULAR | Status: DC
  Administered 2020-01-19: 100 mg via INTRAVENOUS
  Filled 2020-01-17 (×2): qty 2

## 2020-01-17 MED ORDER — VANCOMYCIN HCL IN DEXTROSE 1-5 GM/200ML-% IV SOLN
1000.0000 mg | Freq: Two times a day (BID) | INTRAVENOUS | Status: DC
  Administered 2020-01-18 – 2020-01-19 (×3): 1000 mg via INTRAVENOUS
  Filled 2020-01-17 (×4): qty 200

## 2020-01-17 MED ORDER — PIPERACILLIN-TAZOBACTAM 3.375 G IVPB
3.3750 g | Freq: Three times a day (TID) | INTRAVENOUS | Status: DC
  Administered 2020-01-18 – 2020-01-21 (×10): 3.375 g via INTRAVENOUS
  Filled 2020-01-17 (×11): qty 50

## 2020-01-17 MED ORDER — ONDANSETRON HCL 4 MG/2ML IJ SOLN: 4.0000 mg | Freq: Four times a day (QID) | INTRAMUSCULAR | Status: DC | PRN

## 2020-01-17 MED ORDER — PIPERACILLIN-TAZOBACTAM 3.375 G IVPB 30 MIN
3.3750 g | Freq: Once | INTRAVENOUS | Status: AC
  Administered 2020-01-17: 3.375 g via INTRAVENOUS
  Filled 2020-01-17: qty 50

## 2020-01-17 MED ORDER — SODIUM CHLORIDE 0.9 % IV SOLN: 2.0000 g | INTRAVENOUS | Status: DC

## 2020-01-17 MED ORDER — FERROUS SULFATE 325 (65 FE) MG PO TABS
325.0000 mg | ORAL_TABLET | Freq: Every day | ORAL | Status: DC
  Administered 2020-01-18 – 2020-01-21 (×4): 325 mg via ORAL
  Filled 2020-01-17 (×4): qty 1

## 2020-01-17 MED ORDER — LORAZEPAM 1 MG PO TABS: 1.0000 mg | ORAL_TABLET | ORAL | Status: AC | PRN

## 2020-01-17 MED ORDER — FOLIC ACID 1 MG PO TABS
1.0000 mg | ORAL_TABLET | Freq: Every day | ORAL | Status: DC
  Administered 2020-01-18 – 2020-01-21 (×4): 1 mg via ORAL
  Filled 2020-01-17 (×4): qty 1

## 2020-01-17 MED ORDER — SODIUM CHLORIDE 0.9 % IV SOLN: INTRAVENOUS | Status: DC

## 2020-01-17 MED ORDER — INSULIN ASPART 100 UNIT/ML ~~LOC~~ SOLN
0.0000 [IU] | Freq: Three times a day (TID) | SUBCUTANEOUS | Status: DC
  Administered 2020-01-18: 1 [IU] via SUBCUTANEOUS
  Administered 2020-01-18: 2 [IU] via SUBCUTANEOUS
  Administered 2020-01-19: 1 [IU] via SUBCUTANEOUS
  Administered 2020-01-19 – 2020-01-20 (×3): 3 [IU] via SUBCUTANEOUS
  Administered 2020-01-20: 5 [IU] via SUBCUTANEOUS
  Administered 2020-01-20: 3 [IU] via SUBCUTANEOUS
  Administered 2020-01-21: 5 [IU] via SUBCUTANEOUS
  Administered 2020-01-21: 1 [IU] via SUBCUTANEOUS

## 2020-01-17 MED ORDER — THIAMINE HCL 100 MG PO TABS
100.0000 mg | ORAL_TABLET | Freq: Every day | ORAL | Status: DC
  Administered 2020-01-18 – 2020-01-21 (×4): 100 mg via ORAL
  Filled 2020-01-17 (×4): qty 1

## 2020-01-17 MED ORDER — HYDROXYZINE HCL 25 MG PO TABS: 25.0000 mg | ORAL_TABLET | Freq: Four times a day (QID) | ORAL | Status: DC | PRN

## 2020-01-17 MED ORDER — ACETAMINOPHEN 325 MG PO TABS
650.0000 mg | ORAL_TABLET | Freq: Once | ORAL | Status: AC
  Administered 2020-01-17: 650 mg via ORAL
  Filled 2020-01-17: qty 2

## 2020-01-17 MED ORDER — GABAPENTIN 300 MG PO CAPS
900.0000 mg | ORAL_CAPSULE | ORAL | Status: DC
  Administered 2020-01-18 – 2020-01-21 (×7): 900 mg via ORAL
  Filled 2020-01-17 (×2): qty 9
  Filled 2020-01-17 (×2): qty 3
  Filled 2020-01-17 (×2): qty 9
  Filled 2020-01-17: qty 3

## 2020-01-17 MED ORDER — VANCOMYCIN HCL 10 G IV SOLR
2500.0000 mg | Freq: Once | INTRAVENOUS | Status: AC
  Administered 2020-01-17: 2500 mg via INTRAVENOUS
  Filled 2020-01-17: qty 2500

## 2020-01-17 MED ORDER — ALPRAZOLAM 0.5 MG PO TABS: 1.0000 mg | ORAL_TABLET | Freq: Three times a day (TID) | ORAL | Status: DC | PRN

## 2020-01-17 MED ORDER — PANTOPRAZOLE SODIUM 40 MG PO TBEC
40.0000 mg | DELAYED_RELEASE_TABLET | Freq: Every day | ORAL | Status: DC
  Administered 2020-01-18 – 2020-01-21 (×4): 40 mg via ORAL
  Filled 2020-01-17 (×4): qty 1

## 2020-01-17 MED ORDER — ENOXAPARIN SODIUM 40 MG/0.4ML ~~LOC~~ SOLN
40.0000 mg | SUBCUTANEOUS | Status: DC
  Administered 2020-01-18 – 2020-01-20 (×3): 40 mg via SUBCUTANEOUS
  Filled 2020-01-17 (×3): qty 0.4

## 2020-01-17 MED ORDER — OXYCODONE HCL 5 MG PO TABS
10.0000 mg | ORAL_TABLET | Freq: Four times a day (QID) | ORAL | Status: DC | PRN
  Administered 2020-01-20 – 2020-01-21 (×3): 10 mg via ORAL
  Filled 2020-01-17 (×3): qty 2

## 2020-01-17 MED ORDER — GABAPENTIN 400 MG PO CAPS
1200.0000 mg | ORAL_CAPSULE | Freq: Every day | ORAL | Status: DC
  Administered 2020-01-18 – 2020-01-20 (×4): 1200 mg via ORAL
  Filled 2020-01-17 (×4): qty 3

## 2020-01-17 MED ORDER — SODIUM CHLORIDE 0.9 % IV BOLUS
1000.0000 mL | Freq: Once | INTRAVENOUS | Status: AC
  Administered 2020-01-17: 1000 mL via INTRAVENOUS

## 2020-01-17 MED ORDER — ONDANSETRON HCL 4 MG PO TABS: 4.0000 mg | ORAL_TABLET | Freq: Four times a day (QID) | ORAL | Status: DC | PRN

## 2020-01-17 NOTE — Telephone Encounter (Signed)
Left message for [facility] - [facility]. [facility] to calls with instructions on how to make referral to her agency with the wound care orders as well.

## 2020-01-17 NOTE — ED Provider Notes (Signed)
[facility] EMERGENCY DEPARTMENT Provider Note   CSN: [id number] Arrival date & time: 01/17/20  1455     History No chief complaint on file.   Eugene Diaz is a 54 y.o. male.  54 y.o male with a PMH of HTN, uncontrolled DM, recent chopart amputation by Dr. Boneta Lucks presents to the ED for fever and tachycardia.  According to Dr. Serita Grit note, patient left AMA from SNF, was getting IV antibiotics at the facility.  After lengthy chart review, see that he was getting Sosan on outpatient basis, per Dr. Serita Grit note, patient has worsening cellulitis to his left leg, will need IV antibiotics at this time and hospitalist admission.  Patient reports he is felt unwell for the past several days, has been running fevers although he has not been measuring this at home.  Reports he has had no fevers, urinary symptoms, coughs.  No chest pain, shortness of breath.  No drainage from the wound.  The history is provided by the patient and medical records.       Past Medical History:  Diagnosis Date  . Alcohol abuse   . Alcoholic peripheral neuropathy ([place])   . Anemia   . Anxiety   . Arthritis   . Asthma    followed by pcp  . B12 deficiency   . Cellulitis of left lower leg    07-2019 and left foot  . Depression   . Diabetic neuropathy ([employer])   . Essential tremor    neurologist-- dr patel--- due to alcohol abuse  . GERD (gastroesophageal reflux disease)   . History of esophageal stricture    s/p  dilatation 02-13-2018  . Hypercholesterolemia   . Hypertension    followed by pcp  (nuclear stress test 03-11-2014 low risk w/ no ischemia, ef 65%  . Osteomyelitis of great toe ([place])    left foot s/p amputation 08-14-2019  . Peripheral vascular disease ([place])   . Thrombocytopenia (HCC)    chronic  . Type 2 diabetes mellitus ([place])    followed by pcp--- last A1c in epic 6.6 on 07-12-2019  . Wound dehiscence, surgical    post left great toe amputation 08-14-2019    Patient  Active Problem List   Diagnosis Date Noted  . Acute blood loss anemia 01/04/2020  . Asthma   . Depression with anxiety   . Osteomyelitis of ankle or foot, acute, left ([place]) 12/17/2019  . Hyperglycemia 12/17/2019  . Type 2 diabetes mellitus with foot ulcer ([place])   . Anemia, chronic disease 11/10/2019  . Acute osteomyelitis of left foot ([place]) 11/10/2019  . Marijuana use 11/10/2019  . Wound dehiscence 08/31/2019  . Osteomyelitis due to type 2 diabetes mellitus ([place]) 08/14/2019  . Acute osteomyelitis of toe of left foot ([place]) 08/14/2019  . Sepsis ([place]) 08/14/2019  . Hyponatremia 08/14/2019  . Normocytic anemia 08/14/2019  . B12 deficiency 08/14/2019  . Osteomyelitis of foot ([place])   . Cellulitis of toe, left   . Diabetic infection of left foot ([place])   . Toe fracture, left 07/13/2019  . Cellulitis 07/12/2019  . GERD (gastroesophageal reflux disease) 12/19/2018  . Hepatic steatosis 06/18/2018  . Gastritis, erosive   . Dysphagia, idiopathic 12/27/2017  . Rectal bleeding 12/27/2017  . Intention tremor 06/03/2016  . Left knee pain 12/04/2015  . Alcoholic peripheral neuropathy ([place]) 10/08/2014  . Diabetic neuropathy ([place]) 10/08/2014  . Chest pain, rule out acute myocardial infarction 03/10/2014  . Thrombocytopenia ([place]) 03/10/2014  . ETOH abuse  03/10/2014  . Chest pain 03/10/2014  . Diabetes mellitus without complication ([place])   . Hypertension associated with type 2 diabetes mellitus ([place])   . Hyperlipidemia associated with type 2 diabetes mellitus [facility])     Past Surgical History:  Procedure Laterality Date  . AMPUTATION Left 10/16/2019   Procedure: Left partial second ray resection; placement of antibiotic beads;  Surgeon: Evelina Bucy, DPM;  Location: [facility];  Service: Podiatry;  Laterality: Left;  . AMPUTATION Left 11/13/2019   Procedure: Left Midfoot Amputation - Transmetatarsal vs. Lisfranc; Placement antibiotic beads;  Surgeon: Evelina Bucy, DPM;  [facility]: [facility];   Service: Podiatry;  Laterality: Left;  . AMPUTATION Left 12/21/2019   Procedure: Chopart Amputation left foot;  Surgeon: Evelina Bucy, DPM;  Location: [facility];  Service: Podiatry;  Laterality: Left;  . AMPUTATION TOE Left 08/14/2019   Procedure: AMPUTATION GREAT TOE;  Surgeon: Evelina Bucy, DPM;  [facility];  Service: Podiatry;  Laterality: Left;  . APPLICATION OF WOUND VAC Left 12/21/2019   Procedure: Application Of Wound Vac;  Surgeon: Evelina Bucy, DPM;  Location: [facility];  Service: Podiatry;  Laterality: Left;  . BIOPSY  02/13/2018   Procedure: BIOPSY;  Surgeon: Danie Binder, MD;  [facility];  Service: Endoscopy;;  transverse colon biopsy, gastric biopsy  . COLONOSCOPY WITH PROPOFOL N/A 02/13/2018   Procedure: COLONOSCOPY WITH PROPOFOL;  Surgeon: Danie Binder, MD;  [facility];  Service: Endoscopy;  Laterality: N/A;  11:15am  . ESOPHAGOGASTRODUODENOSCOPY (EGD) WITH PROPOFOL N/A 02/13/2018   Procedure: ESOPHAGOGASTRODUODENOSCOPY (EGD) WITH PROPOFOL;  Surgeon: Danie Binder, MD;  [facility];  Service: Endoscopy;  Laterality: N/A;  . I & D EXTREMITY Left 07/16/2019   Procedure: Insicion  AND DEBRIDEMENT EXTREMITY;  Surgeon: Evelina Bucy, DPM;  Location: [facility];  Service: Podiatry;  Laterality: Left;  . INCISION AND DRAINAGE OF WOUND Left 10/13/2019   Procedure: IRRIGATION AND DEBRIDEMENT WOUND OF LEFT FOOT AND FIRST METATARSAL RESECTION;  Surgeon: Evelina Bucy, DPM;  [facility];  Service: Podiatry;  Laterality: Left;  . IRRIGATION AND DEBRIDEMENT FOOT Left 11/11/2019   Procedure: Left Foot Wound Irrigation and Debridement;  Surgeon: Evelina Bucy, DPM;  [facility];  Service: Podiatry;  Laterality: Left;  . Left arm     Left arm repair (tendon and artery)  . PERCUTANEOUS PINNING  07/16/2019   Procedure: Open Reduction Percutaneous Pinning Extremity;  Surgeon: Evelina Bucy, DPM;  Location: [facility];  Service: Podiatry;;    . PILONIDAL CYST EXCISION N/A 08/08/2014   Procedure: CYST EXCISION PILONIDAL EXTENSIVE;  Surgeon: Jamesetta So, MD;  [facility]: [facility];  Service: General;  Laterality: N/A;  . POLYPECTOMY  02/13/2018   Procedure: POLYPECTOMY;  Surgeon: Danie Binder, MD;  [facility];  Service: Endoscopy;;  transverse colon polyp hs, rectal polyps times 2  . SAVORY DILATION N/A 02/13/2018   Procedure: SAVORY DILATION;  Surgeon: Danie Binder, MD;  [facility];  Service: Endoscopy;  Laterality: N/A;  . WOUND DEBRIDEMENT Left 09/04/2019   Procedure: DEBRIDEMENT WOUND WITH COMPLEX  REPAIR OF DEHISCENCE;  Surgeon: Evelina Bucy, DPM;  Location: [facility];  Service: Podiatry;  Laterality: Left;  Leave patient on stretcher  . WOUND DEBRIDEMENT Left 10/16/2019   Procedure: Left foot wound debridement and closure;  Surgeon: Evelina Bucy, DPM;  [facility];  Service: Podiatry;  Laterality: Left;  . WOUND DEBRIDEMENT Left 12/18/2019   Procedure: LEFT FOOT DEBRIDEMENT WITH PARTIAL INCISION OF INFECTED BONE;  Surgeon: Evelina Bucy, DPM;  Location: [facility];  Service: Podiatry;  Laterality: Left;  LEFT FOOT DEBRIDEMENT WITH PARTIAL INCISION OF INFECTED BONE       Family History  Problem Relation Age of Onset  . Heart attack Mother        Living, 39  . Cancer Father        Deceased  . Healthy Brother   . Healthy Sister   . Colon cancer Neg Hx   . Colon polyps Neg Hx     Social History   Tobacco Use  . Smoking status: Former Smoker    Packs/day: 2.50    Years: 25.00    Pack years: 62.50    Types: Cigarettes    Quit date: 08/05/1984    Years since quitting: 35.4  . Smokeless tobacco: Never Used  Substance Use Topics  . Alcohol use: Yes    Alcohol/week: 50.0 standard drinks    Types: 50 Standard drinks or equivalent per week    Comment: Drinks 6 beers, liquor 4-6oz (both daily) x 35 years  . Drug use: Yes    Types: Marijuana    Comment: 09-03-2019   per pt states very seldom    Home Medications Prior to Admission medications   Medication Sig Start Date End Date Taking? Authorizing Provider  acetaminophen (TYLENOL) 325 MG tablet Take 2 tablets (650 mg total) by mouth every 6 (six) hours as needed for mild pain (or Fever >/= 101). 12/27/19   Arrien, [name], MD  albuterol (PROVENTIL HFA;VENTOLIN HFA) 108 (90 Base) MCG/ACT inhaler Inhale 2 puffs into the lungs every 6 (six) hours as needed for wheezing or shortness of breath. 10/31/18   Hayden Rasmussen, MD  ALPRAZolam Duanne Moron) 1 MG tablet Take 1 tablet (1 mg total) by mouth 3 (three) times daily as needed for anxiety. 01/07/20   Ghimire, Henreitta Leber, MD  amitriptyline (ELAVIL) 100 MG tablet Take 100 mg by mouth at bedtime.    [provider]  atorvastatin (LIPITOR) 40 MG tablet Take 40 mg by mouth every evening.  12/17/15   [provider]  docusate sodium (COLACE) 100 MG capsule Take 100 mg by mouth daily as needed for mild constipation.    [provider]  DULoxetine (CYMBALTA) 60 MG capsule Take 60 mg by mouth daily. 11/18/19   [provider]  ferrous sulfate 325 (65 FE) MG tablet Take 1 tablet (325 mg total) by mouth daily. 11/15/19 11/14/20  Shelly Coss, MD  gabapentin (NEURONTIN) 300 MG capsule Take 995m in the morning and at noon, then take 12045mat bedtime Patient taking differently: Take 900-1,200 mg by mouth See admin instructions. Take 9003mn the morning and at noon, then take 1200m38m bedtime 06/14/19   PateNarda AmberDO  glimepiride (AMARYL) 4 MG tablet Take 4 mg by mouth daily. 08/10/19   [provider]  glycopyrrolate (ROBINUL) 1 MG tablet Take 1 mg by mouth 2 (two) times daily.  12/17/15   [provider]  hydrOXYzine (ATARAX/VISTARIL) 25 MG tablet Take 25 mg by mouth 4 (four) times daily as needed for anxiety.  06/17/19   [provider]  ibuprofen (ADVIL) 400 MG tablet Take 1 tablet (400 mg total) by mouth  every 8 (eight) hours as needed for moderate pain. 12/27/19   Arrien, MaurJimmy Picket  insulin aspart (  NOVOLOG) 100 UNIT/ML injection 0-9 Units, Subcutaneous, 3 times daily with meals CBG < 70: Implement Hypoglycemia measures, call MD CBG 70 - 120: 0 units CBG 121 - 150: 1 unit CBG 151 - 200: 2 units CBG 201 - 250: 3 units CBG 251 - 300: 5 units CBG 301 - 350: 7 units CBG 351 - 400: 9 units CBG > 400: call MD 01/07/20   Jonetta Osgood, MD  lisinopril (PRINIVIL,ZESTRIL) 20 MG tablet Take 20 mg by mouth daily.  12/30/15   [provider]  omeprazole (PRILOSEC) 20 MG capsule TAKE 1 CAPSULE WITH BREAKFAST AND SUPPER. Patient taking differently: Take 20 mg by mouth 2 (two) times daily before a meal.  11/19/19   Mahala Menghini, PA-C  Oxycodone HCl 10 MG TABS Take 1 tablet (10 mg total) by mouth every 6 (six) hours as needed. 01/07/20   Ghimire, Henreitta Leber, MD  piperacillin-tazobactam (ZOSYN) IVPB Inject 3.375 g into the vein every 6 (six) hours for 23 days. Indication:  Left foot osteomyelitis Last Day of Therapy:  01/18/2020 Labs - Once weekly:  CBC/D and BMP, Labs - Every other week:  ESR and CRP Patient not taking: Reported on 01/17/2020 12/27/19 01/19/20  Arrien, [name], MD  primidone (MYSOLINE) 50 MG tablet Take 1 tablet (50 mg total) by mouth 2 (two) times daily. Patient taking differently: Take 50 mg by mouth 2 (two) times daily.  06/14/19   Patel, Donika K, DO  VOLTAREN 1 % GEL Apply 2 g topically 4 (four) times daily as needed (for pain).  08/19/15   [provider]    Allergies    Patient has no known allergies.  Review of Systems   Review of Systems  Constitutional: Positive for fever.  HENT: Negative for sore throat.   Respiratory: Negative for shortness of breath.   Cardiovascular: Negative for chest pain and leg swelling.  Gastrointestinal: Negative for abdominal pain, nausea and vomiting.  Genitourinary: Negative for flank pain.  Musculoskeletal:  Positive for arthralgias.  Neurological: Negative for light-headedness and headaches.  All other systems reviewed and are negative.   Physical Exam Updated Vital Signs BP 137/66   Pulse (!) 118   Temp (!) 103.1 F (39.5 C) (Oral)   Resp 12   Ht _0  (1.88 m)   Wt 122 kg   SpO2 96%   BMI 34.53 kg/m   Physical Exam Vitals and nursing note reviewed.  Constitutional:      Appearance: Normal appearance. He is ill-appearing.  HENT:     Head: Normocephalic and atraumatic.     Nose: Nose normal.     Mouth/Throat:     Mouth: Mucous membranes are dry.  Eyes:     Pupils: Pupils are equal, round, and reactive to light.  Cardiovascular:     Rate and Rhythm: Tachycardia present.  Pulmonary:     Effort: Pulmonary effort is normal.     Breath sounds: No wheezing or rales.  Abdominal:     General: Abdomen is flat.     Tenderness: There is no abdominal tenderness. There is no right CVA tenderness or left CVA tenderness.  Musculoskeletal:     Cervical back: Normal range of motion and neck supple.  Skin:    General: Skin is warm and dry.     Findings: Erythema present.  Neurological:     Mental Status: He is alert. He is disoriented.     ED Results / Procedures / Treatments   Labs (  all labs ordered are listed, but only abnormal results are displayed) Labs Reviewed  COMPREHENSIVE METABOLIC PANEL - Abnormal; Notable for the following components:      Result Value   Glucose, Bld 194 (*)    Creatinine, Ser 1.26 (*)    Albumin 3.3 (*)    AST 14 (*)    All other components within normal limits  CBC WITH DIFFERENTIAL/PLATELET - Abnormal; Notable for the following components:   RBC 3.26 (*)    Hemoglobin 9.7 (*)    HCT 30.9 (*)    All other components within normal limits  CULTURE, BLOOD (ROUTINE X 2)  CULTURE, BLOOD (ROUTINE X 2)  URINE CULTURE  SARS CORONAVIRUS 2 (TAT 6-24 HRS)  LACTIC ACID, PLASMA  APTT  PROTIME-INR  LACTIC ACID, PLASMA  URINALYSIS, ROUTINE W REFLEX  MICROSCOPIC    EKG EKG Interpretation  Date/Time:  Friday January 17 2020 14:59:59 EDT Ventricular Rate:  127 PR Interval:  176 QRS Duration: 82 QT Interval:  286 QTC Calculation: 415 R Axis:   -68 Text Interpretation: Sinus tachycardia Left axis deviation Inferior infarct , age undetermined Anterior infarct , age undetermined Abnormal ECG Confirmed by Lennice Sites [phone]) on 01/17/2020 5:00:18 PM   Radiology DG Foot Complete Left  Result Date: 01/17/2020 CLINICAL DATA:  Patient status post amputation of the left foot at the level of the calcaneus and talus 12/21/2019. EXAM: LEFT FOOT - COMPLETE 3+ VIEW COMPARISON:  Plain films left foot 01/04/2020 and 12/17/2019. FINDINGS: Surgical staples remain in place. Amputation at the level of the talus and calcaneus again seen. Soft tissues at the amputation site appear swollen. No bony destructive change is identified. No soft tissue gas is identified. No tibiotalar joint effusion. IMPRESSION: Status post amputation as described above. Soft tissues of the amputation appear swollen. The exam is otherwise negative. Electronically Signed   By: Inge Rise M.D.   On: 01/17/2020 15:39    Procedures Procedures (including critical care time)  Medications Ordered in ED Medications  piperacillin-tazobactam (ZOSYN) IVPB 3.375 g (has no administration in time range)  acetaminophen (TYLENOL) tablet 650 mg (650 mg Oral Given 01/17/20 1514)  piperacillin-tazobactam (ZOSYN) IVPB 3.375 g (3.375 g Intravenous New Bag/Given 01/17/20 1652)  acetaminophen (TYLENOL) tablet 325 mg (325 mg Oral Given 01/17/20 1652)  sodium chloride 0.9 % bolus 1,000 mL (1,000 mLs Intravenous New Bag/Given 01/17/20 1652)    ED Course  I have reviewed the triage vital signs and the nursing notes.  Pertinent labs & imaging results that were available during my care of the patient were reviewed by me and considered in my medical decision making (see chart for details).  Clinical  Course as of Jan 16 1734  Fri Jan 17, 2020  1705 Given tylenol an hour prior, additional tylenol ordered.   Temp(!): 103.1 F (39.5 C) [JS]    Clinical Course User Index [JS] Janeece Fitting, PA-C   MDM Rules/Calculators/A&P  Patient with a  PMH of DM, HTN, cellulitis of the left leg presents to the ED sent in by Dr. [name] of podiatry's office.  Patient reports he has been running fever for several days, has reported no drainage from his wound however the leg does appear erythematous along with some edema noted but no purulent discharge.  According to Dr. Serita Grit note after seeing him in office febrile, with mild tachycardia he was sent in for IV antibiotics into the ED.  Patient is ill-appearing, febrile with a temperature of 103.1, tachycardia in the  120s, given 650 of Tylenol while waiting for triage.  Patient overall appears disoriented, is able to answer questions however due to fever along with tachycardia, will activate code sepsis at this time.  Pharmacy was consulted, after review of his chart see that he was supposed to complete Zosyn by tomorrow.  Unsure if patient has been compliant with antibiotic medication.  Interpretation of his labs reveal no leukocytosis.  CMP is remarkable for mild AKI at 1.26, IV fluids have been ordered per patient.  No signs of heart failure on my exam or prior history of this on his chart.  Lactic acid is negative.  Covid test has been sent off, will call for hospitalist admission at this time. Xray of the left foot shows: Status post amputation as described above. Soft tissues of the  amputation appear swollen. The exam is otherwise negative.   5:33 PM spoke to hospitalist who will admit patient for further management.  Patient's pressures remain stable.  Portions of this note were generated with [employer]. Dictation errors may occur despite best attempts at proofreading.  Final Clinical Impression(s) / ED Diagnoses Final diagnoses:    Cellulitis of left lower extremity    Rx / DC Orders ED Discharge Orders    None       Janeece Fitting, PA-C 01/17/20 [place], [place], DO 01/17/20 1757

## 2020-01-17 NOTE — ED Notes (Signed)
PA Soto ok w/ one set of cultures from PICC line

## 2020-01-17 NOTE — Telephone Encounter (Signed)
Pt's sister, [name] called states pt has an appt today and she would like the PICC line looked at, he has still not been set up with [facility], although pt states someone called him from [facility] and pt ran a fever yesterday.

## 2020-01-17 NOTE — Telephone Encounter (Signed)
I informed pt's str, [name] that pt had been referred to the [facility] ED for foot infection.

## 2020-01-17 NOTE — H&P (Signed)
TRH H&P    Patient Demographics:    Eugene Diaz, is a 54 y.o. male  MRN: [id number]  DOB - 11/22/1965  Admit Date - 01/17/2020  Referring MD/NP/PA: Ronnald Nian  Outpatient Primary MD for the patient is [name], PA-C  Patient coming from: [place] office  Chief complaint-left lower extremity swelling and redness   HPI:    Eugene Diaz  is a 54 y.o. male, with history of alcohol abuse, diabetes mellitus type 2 for increasing to marked hypotension done by Dr. Boneta Lucks, was seen in his office today and was sent to the ED for evaluation of fever and tachycardia.  Not obtainable patient left AMA from skilled nursing facility after getting IV antibiotics at the facility.  Patient was getting Zosyn as outpatient basis as Dr. [name] of worsening of cellulitis of left lower extremity.  He was sent to the ED ED for IV antibiotics. Patient is a poor historian, denies any pain in the lower extremity.  He has diabetic neuropathy.  Has been feeling and having fever.  Denies abdominal pain, no nausea no vomiting.  No dysuria urgency frequency documentation.  Denies chest pain or shortness of breath.  Denies any pain in the legs or extremities he has diabetic neuropathy. In the ED, he was found to be febrile with temperature 103.1, tachycardic with heart rate 126, lactic acid 1.3.  Patient was started on IV Zosyn.     Review of systems:    In addition to the HPI above,    All other systems reviewed and are negative.    Past History of the following :    Past Medical History:  Diagnosis Date  . Alcohol abuse   . Alcoholic peripheral neuropathy ([place])   . Anemia   . Anxiety   . Arthritis   . Asthma    followed by pcp  . B12 deficiency   . Cellulitis of left lower leg    07-2019 and left foot  . Depression   . Diabetic neuropathy ([place])   . Essential tremor    neurologist-- dr patel--- due to alcohol  abuse  . GERD (gastroesophageal reflux disease)   . History of esophageal stricture    s/p  dilatation 02-13-2018  . Hypercholesterolemia   . Hypertension    followed by pcp  (nuclear stress test 03-11-2014 low risk w/ no ischemia, ef 65%  . Osteomyelitis of great toe ([place])    left foot s/p amputation 08-14-2019  . Peripheral vascular disease ([place])   . Thrombocytopenia ([facility])    chronic  . Type 2 diabetes mellitus ([place])    followed by pcp--- last A1c in epic 6.6 on 07-12-2019  . Wound dehiscence, surgical    post left great toe amputation 08-14-2019      Past Surgical History:  Procedure Laterality Date  . AMPUTATION Left 10/16/2019   Procedure: Left partial second ray resection; placement of antibiotic beads;  Surgeon: Evelina Bucy, DPM;  [facility]: [facility];  Service: Podiatry;  Laterality: Left;  . AMPUTATION Left 11/13/2019  Procedure: Left Midfoot Amputation - Transmetatarsal vs. Lisfranc; Placement antibiotic beads;  Surgeon: Evelina Bucy, DPM;  [facility];  Service: Podiatry;  Laterality: Left;  . AMPUTATION Left 12/21/2019   Procedure: Chopart Amputation left foot;  Surgeon: Evelina Bucy, DPM;  Location: [facility];  Service: Podiatry;  Laterality: Left;  . AMPUTATION TOE Left 08/14/2019   Procedure: AMPUTATION GREAT TOE;  Surgeon: Evelina Bucy, DPM;  [facility];  Service: Podiatry;  Laterality: Left;  . APPLICATION OF WOUND VAC Left 12/21/2019   Procedure: Application Of Wound Vac;  Surgeon: Evelina Bucy, DPM;  Location: [place];  Service: Podiatry;  Laterality: Left;  . BIOPSY  02/13/2018   Procedure: BIOPSY;  Surgeon: Danie Binder, MD;  [facility];  Service: Endoscopy;;  transverse colon biopsy, gastric biopsy  . COLONOSCOPY WITH PROPOFOL N/A 02/13/2018   Procedure: COLONOSCOPY WITH PROPOFOL;  Surgeon: Danie Binder, MD;  [facility];  Service: Endoscopy;  Laterality: N/A;  11:15am  . ESOPHAGOGASTRODUODENOSCOPY (EGD) WITH  PROPOFOL N/A 02/13/2018   Procedure: ESOPHAGOGASTRODUODENOSCOPY (EGD) WITH PROPOFOL;  Surgeon: Danie Binder, MD;  [facility];  Service: Endoscopy;  Laterality: N/A;  . I & D EXTREMITY Left 07/16/2019   Procedure: Insicion  AND DEBRIDEMENT EXTREMITY;  Surgeon: Evelina Bucy, DPM;  Location: [place];  Service: Podiatry;  Laterality: Left;  . INCISION AND DRAINAGE OF WOUND Left 10/13/2019   Procedure: IRRIGATION AND DEBRIDEMENT WOUND OF LEFT FOOT AND FIRST METATARSAL RESECTION;  Surgeon: Evelina Bucy, DPM;  [facility];  Service: Podiatry;  Laterality: Left;  . IRRIGATION AND DEBRIDEMENT FOOT Left 11/11/2019   Procedure: Left Foot Wound Irrigation and Debridement;  Surgeon: Evelina Bucy, DPM;  [facility];  Service: Podiatry;  Laterality: Left;  . Left arm     Left arm repair (tendon and artery)  . PERCUTANEOUS PINNING  07/16/2019   Procedure: Open Reduction Percutaneous Pinning Extremity;  Surgeon: Evelina Bucy, DPM;  Location: [place];  Service: Podiatry;;  . PILONIDAL CYST EXCISION N/A 08/08/2014   Procedure: CYST EXCISION PILONIDAL EXTENSIVE;  Surgeon: Jamesetta So, MD;  [facility];  Service: General;  Laterality: N/A;  . POLYPECTOMY  02/13/2018   Procedure: POLYPECTOMY;  Surgeon: Danie Binder, MD;  [facility];  Service: Endoscopy;;  transverse colon polyp hs, rectal polyps times 2  . SAVORY DILATION N/A 02/13/2018   Procedure: SAVORY DILATION;  Surgeon: Danie Binder, MD;  [facility];  Service: Endoscopy;  Laterality: N/A;  . WOUND DEBRIDEMENT Left 09/04/2019   Procedure: DEBRIDEMENT WOUND WITH COMPLEX  REPAIR OF DEHISCENCE;  Surgeon: Evelina Bucy, DPM;  Location: [place];  Service: Podiatry;  Laterality: Left;  Leave patient on stretcher  . WOUND DEBRIDEMENT Left 10/16/2019   Procedure: Left foot wound debridement and closure;  Surgeon: Evelina Bucy, DPM;  [facility];  Service: Podiatry;   Laterality: Left;  . WOUND DEBRIDEMENT Left 12/18/2019   Procedure: LEFT FOOT DEBRIDEMENT WITH PARTIAL INCISION OF INFECTED BONE;  Surgeon: Evelina Bucy, DPM;  Location: [place];  Service: Podiatry;  Laterality: Left;  LEFT FOOT DEBRIDEMENT WITH PARTIAL INCISION OF INFECTED BONE      Social History:      Social History   Tobacco Use  . Smoking status: Former Smoker    Packs/day: 2.50    Years: 25.00    Pack years: 62.50  Types: Cigarettes    Quit date: 08/05/1984    Years since quitting: 35.4  . Smokeless tobacco: Never Used  Substance Use Topics  . Alcohol use: Yes    Alcohol/week: 50.0 standard drinks    Types: 50 Standard drinks or equivalent per week    Comment: Drinks 6 beers, liquor 4-6oz (both daily) x 35 years       Family History :     Family History  Problem Relation Age of Onset  . Heart attack Mother        Living, 22  . Cancer Father        Deceased  . Healthy Brother   . Healthy Sister   . Colon cancer Neg Hx   . Colon polyps Neg Hx       Home Medications:   Prior to Admission medications   Medication Sig Start Date End Date Taking? Authorizing Provider  acetaminophen (TYLENOL) 325 MG tablet Take 2 tablets (650 mg total) by mouth every 6 (six) hours as needed for mild pain (or Fever >/= 101). 12/27/19   Arrien, [name], MD  albuterol (PROVENTIL HFA;VENTOLIN HFA) 108 (90 Base) MCG/ACT inhaler Inhale 2 puffs into the lungs every 6 (six) hours as needed for wheezing or shortness of breath. 10/31/18   Hayden Rasmussen, MD  ALPRAZolam Duanne Moron) 1 MG tablet Take 1 tablet (1 mg total) by mouth 3 (three) times daily as needed for anxiety. 01/07/20   Ghimire, Henreitta Leber, MD  amitriptyline (ELAVIL) 100 MG tablet Take 100 mg by mouth at bedtime.    [provider]  atorvastatin (LIPITOR) 40 MG tablet Take 40 mg by mouth every evening.  12/17/15   [provider]  docusate sodium (COLACE) 100 MG capsule Take 100 mg by mouth daily as needed  for mild constipation.    [provider]  DULoxetine (CYMBALTA) 60 MG capsule Take 60 mg by mouth daily. 11/18/19   [provider]  ferrous sulfate 325 (65 FE) MG tablet Take 1 tablet (325 mg total) by mouth daily. 11/15/19 11/14/20  Shelly Coss, MD  gabapentin (NEURONTIN) 300 MG capsule Take 954m in the morning and at noon, then take 12022mat bedtime Patient taking differently: Take 900-1,200 mg by mouth See admin instructions. Take 90018mn the morning and at noon, then take 1200m35m bedtime 06/14/19   PateNarda AmberDO  glimepiride (AMARYL) 4 MG tablet Take 4 mg by mouth daily. 08/10/19   [provider]  glycopyrrolate (ROBINUL) 1 MG tablet Take 1 mg by mouth 2 (two) times daily.  12/17/15   [provider]  hydrOXYzine (ATARAX/VISTARIL) 25 MG tablet Take 25 mg by mouth 4 (four) times daily as needed for anxiety.  06/17/19   [provider]  ibuprofen (ADVIL) 400 MG tablet Take 1 tablet (400 mg total) by mouth every 8 (eight) hours as needed for moderate pain. 12/27/19   Arrien, MaurJimmy Picket  insulin aspart (NOVOLOG) 100 UNIT/ML injection 0-9 Units, Subcutaneous, 3 times daily with meals CBG < 70: Implement Hypoglycemia measures, call MD CBG 70 - 120: 0 units CBG 121 - 150: 1 unit CBG 151 - 200: 2 units CBG 201 - 250: 3 units CBG 251 - 300: 5 units CBG 301 - 350: 7 units CBG 351 - 400: 9 units CBG > 400: call MD 01/07/20   GhimJonetta Osgood  lisinopril (PRINIVIL,ZESTRIL) 20 MG tablet Take 20 mg by mouth daily.  12/30/15   [provider]  omeprazole (PRILOSEC) 20 MG capsule TAKE 1 CAPSULE WITH BREAKFAST AND SUPPER. Patient taking differently: Take 20 mg by mouth 2 (two) times daily before a meal.  11/19/19   Mahala Menghini, PA-C  Oxycodone HCl 10 MG TABS Take 1 tablet (10 mg total) by mouth every 6 (six) hours as needed. 01/07/20   Ghimire, Henreitta Leber, MD  piperacillin-tazobactam (ZOSYN) IVPB Inject 3.375 g into the vein every  6 (six) hours for 23 days. Indication:  Left foot osteomyelitis Last Day of Therapy:  01/18/2020 Labs - Once weekly:  CBC/D and BMP, Labs - Every other week:  ESR and CRP Patient not taking: Reported on 01/17/2020 12/27/19 01/19/20  Arrien, [name], MD  primidone (MYSOLINE) 50 MG tablet Take 1 tablet (50 mg total) by mouth 2 (two) times daily. Patient taking differently: Take 50 mg by mouth 2 (two) times daily.  06/14/19   Patel, Donika K, DO  VOLTAREN 1 % GEL Apply 2 g topically 4 (four) times daily as needed (for pain).  08/19/15   [provider]     Allergies:    No Known Allergies   Physical Exam:   Vitals  Blood pressure 137/66, pulse (!) 118, temperature (!) 103.1 F (39.5 C), temperature source Oral, resp. rate 12, height 6' 2"  (1.88 m), weight 122 kg, SpO2 96 %.  1.  General: Appears in no acute distress  2. Psychiatric: Alert, oriented x3, intact insight and judgment  3. Neurologic: Cranial nerves grossly intact, no focal deficits noted  4. HEENMT:  Atraumatic normocephalic, extraocular's are intact  5. Respiratory : Clear to auscultation bilaterally, no wheezing or crackles auscultated  6. Cardiovascular : S1-S2, regular, no murmur auscultated  7. Gastrointestinal:  Abdomen soft, nontender, no  8. Skin:  Left foot in dressing Picture below from Dr. Serita Grit office.         Data Review:    CBC Recent Labs  Lab 01/17/20 1518  WBC 6.5  HGB 9.7*  HCT 30.9*  PLT 175  MCV 94.8  MCH 29.8  MCHC 31.4  RDW 14.3  LYMPHSABS 0.8  MONOABS 0.4  EOSABS 0.1  BASOSABS 0.0   ------------------------------------------------------------------------------------------------------------------  Results for orders placed or performed during the hospital encounter of 01/17/20 (from the past 48 hour(s))  Lactic acid, plasma     Status: None   Collection Time: 01/17/20  3:18 PM  Result Value Ref Range   Lactic Acid, Venous 1.3 0.5 - 1.9 mmol/L     Comment: Performed at [facility], [place] [address], [place], [place] [address]  Comprehensive metabolic panel     Status: Abnormal   Collection Time: 01/17/20  3:18 PM  Result Value Ref Range   Sodium 135 135 - 145 mmol/L   Potassium 4.5 3.5 - 5.1 mmol/L   Chloride 99 98 - 111 mmol/L   CO2 24 22 - 32 mmol/L   Glucose, Bld 194 (H) 70 - 99 mg/dL    Comment: Glucose reference range applies only to samples taken after fasting for at least 8 hours.   BUN 16 6 - 20 mg/dL   Creatinine, Ser 1.26 (H) 0.61 - 1.24 mg/dL   Calcium 9.2 8.9 - 10.3 mg/dL   Total Protein 6.9 6.5 - 8.1 g/dL   Albumin 3.3 (L) 3.5 - 5.0 g/dL   AST 14 (L) 15 - 41 U/L   ALT 10 0 - 44 U/L   Alkaline Phosphatase 62 38 - 126 U/L  Total Bilirubin 1.1 0.3 - 1.2 mg/dL   GFR calc non Af Amer >60 >60 mL/min   GFR calc Af Amer >60 >60 mL/min   Anion gap 12 5 - 15    Comment: Performed at [facility] [address], [place], [place] [address]  CBC with Differential     Status: Abnormal   Collection Time: 01/17/20  3:18 PM  Result Value Ref Range   WBC 6.5 4.0 - 10.5 K/uL   RBC 3.26 (L) 4.22 - 5.81 MIL/uL   Hemoglobin 9.7 (L) 13.0 - 17.0 g/dL   HCT 30.9 (L) 39.0 - 52.0 %   MCV 94.8 80.0 - 100.0 fL   MCH 29.8 26.0 - 34.0 pg   MCHC 31.4 30.0 - 36.0 g/dL   RDW 14.3 11.5 - 15.5 %   Platelets 175 150 - 400 K/uL   nRBC 0.0 0.0 - 0.2 %   Neutrophils Relative % 79 %   Neutro Abs 5.2 1.7 - 7.7 K/uL   Lymphocytes Relative 12 %   Lymphs Abs 0.8 0.7 - 4.0 K/uL   Monocytes Relative 6 %   Monocytes Absolute 0.4 0.1 - 1.0 K/uL   Eosinophils Relative 2 %   Eosinophils Absolute 0.1 0.0 - 0.5 K/uL   Basophils Relative 1 %   Basophils Absolute 0.0 0.0 - 0.1 K/uL   Immature Granulocytes 0 %   Abs Immature Granulocytes 0.01 0.00 - 0.07 K/uL    Comment: Performed at [facility] [address], [place], [place] [address]  APTT     Status: None   Collection Time: 01/17/20  4:33 PM  Result Value Ref Range   aPTT 33  24 - 36 seconds    Comment: Performed at [facility] [address], [place], [place] [address]  Protime-INR     Status: None   Collection Time: 01/17/20  4:33 PM  Result Value Ref Range   Prothrombin Time 14.0 11.4 - 15.2 seconds   INR 1.1 0.8 - 1.2    Comment: (NOTE) INR goal varies based on device and disease states. Performed at [facility], [place] [address] [place],  [address]     Chemistries  Recent Labs  Lab 01/17/20 1518  NA 135  K 4.5  CL 99  CO2 24  GLUCOSE 194*  BUN 16  CREATININE 1.26*  CALCIUM 9.2  AST 14*  ALT 10  ALKPHOS 62  BILITOT 1.1   ------------------------------------------------------------------------------------------------------------------  ------------------------------------------------------------------------------------------------------------------ GFR: Estimated Creatinine Clearance: 93 mL/min (A) (by C-G formula based on SCr of 1.26 mg/dL (H)). Liver Function Tests: Recent Labs  Lab 01/17/20 1518  AST 14*  ALT 10  ALKPHOS 62  BILITOT 1.1  PROT 6.9  ALBUMIN 3.3*   No results for input(s): LIPASE, AMYLASE in the last 168 hours. No results for input(s): AMMONIA in the last 168 hours. Coagulation Profile: Recent Labs  Lab 01/17/20 1633  INR 1.1     --------------------------------------------------------------------------------------------------------------- Urine analysis:    Component Value Date/Time   COLORURINE YELLOW 08/14/2019 [place] 08/14/2019 1232   LABSPEC 1.020 08/14/2019 1232   PHURINE 5.0 08/14/2019 1232   GLUCOSEU NEGATIVE 08/14/2019 [place] 08/14/2019 [place] 08/14/2019 [place] 08/14/2019 1232   PROTEINUR NEGATIVE 08/14/2019 1232   UROBILINOGEN 4.0 (H) 10/09/2008 1114   NITRITE NEGATIVE 08/14/2019 1232   LEUKOCYTESUR NEGATIVE 08/14/2019 1232      Imaging  Results:    DG Foot Complete Left  Result Date:  01/17/2020 CLINICAL DATA:  Patient status post amputation of the left foot at the level of the calcaneus and talus 12/21/2019. EXAM: LEFT FOOT - COMPLETE 3+ VIEW COMPARISON:  Plain films left foot 01/04/2020 and 12/17/2019. FINDINGS: Surgical staples remain in place. Amputation at the level of the talus and calcaneus again seen. Soft tissues at the amputation site appear swollen. No bony destructive change is identified. No soft tissue gas is identified. No tibiotalar joint effusion. IMPRESSION: Status post amputation as described above. Soft tissues of the amputation appear swollen. The exam is otherwise negative. Electronically Signed   By: Inge Rise M.D.   On: 01/17/2020 15:39    My personal review of EKG: Rhythm NSR, no ST-T changes   Assessment & Plan:    Active Problems:   Sepsis ([place])   1. Sepsis-patient presented with sepsis from wound infection, he had recent chopart amputation by podiatrist.  Lactic acid 1.0.  Patient started on IV Zosyn.  We will also add IV vancomycin per pharmacy consultation.  Will obtain blood cultures x2.  If patient does not improve with IV antibiotics, consider MRI of the left foot to rule out underlying osteomyelitis.  Will obtain CRP, ESR. 2. Diabetes mellitus type 2-we will start sliding insulin with NovoLog.  Hold for blood glucose increase insulin this time. 3. History of alcohol abuse-patient said that his last drink was 2 days ago.  No signs and symptoms of alcohol withdrawal at this time.  Will start CIWA protocol. 4. Diabetic neuropathy-continue Neurontin, Elavil. 5. History of COPD-no exacerbation, continue albuterol. 6. Hyperlipidemia-continue atorvastatin 7.    DVT Prophylaxis-   Lovenox   AM Labs Ordered, also please review Full Orders  Family Communication: Admission, patients condition and plan of care including tests being ordered have been discussed with the patient and  who indicate understanding and agree with the plan and Code  Status.  Code Status: Full code  Admission status: Observation/Inpatient :The appropriate admission status for this patient is INPATIENT. Inpatient status is judged to be reasonable and necessary in order to provide the required intensity of service to ensure the patient's safety. The patient's presenting symptoms, physical exam findings, and initial radiographic and laboratory data in the context of their chronic comorbidities is felt to place them at high risk for further clinical deterioration. Furthermore, it is not anticipated that the patient will be medically stable for discharge from the hospital within 2 midnights of admission. The following factors support the admission status of inpatient.     The patient's presenting symptoms include left lower extremity swelling and redness The worrisome physical exam findings include sepsis. The initial radiographic and laboratory data are worrisome because of sepsis. The chronic co-morbidities include diabetes mellitus type 2.       * I certify that at the point of admission it is my clinical judgment that the patient will require inpatient hospital care spanning beyond 2 midnights from the point of admission due to high intensity of service, high risk for further deterioration and high frequency of surveillance required.*  Time spent in minutes : 60 minutes   Jennavecia Schwier S Rithwik Schmieg M.D

## 2020-01-17 NOTE — Telephone Encounter (Signed)
I informed pt's str, [name] that [facility] had spoken with [facility], who stated she had been unable to get pt in [address] due to pt's non-compliance, and Dr. [name] would evaluate pt and the PICC line for patency and give orders accordingly.

## 2020-01-17 NOTE — Progress Notes (Signed)
Subjective:  Patient ID: Eugene Diaz, male    DOB: 1966/09/12,  MRN: [id number]  No chief complaint on file.   54 y.o. male returns for post-op check.  Patient presents with follow-up postop Chopart amputation.  Patient left his nursing facility where he was receiving IV antibiotics through PICC line.  He presents today in clinic with worsening infection to the left lower extremity mostly soft tissue cellulitis.  His incision and wound looks pretty well overall.  He denies any other acute complaints.  He has been keeping a dressing over it.  He has been nonweightbearing to it.  His A1c is unknown and is uncontrolled.  Vitals in clinic patient has a fever of 103.6, BP 154/83 mildly tachycardic at 119.  Review of Systems: Negative except as noted in the HPI. Denies N/V/F/Ch.  Past Medical History:  Diagnosis Date  . Alcohol abuse   . Alcoholic peripheral neuropathy ([place])   . Anemia   . Anxiety   . Arthritis   . Asthma    followed by pcp  . B12 deficiency   . Cellulitis of left lower leg    07-2019 and left foot  . Depression   . Diabetic neuropathy ([place])   . Essential tremor    neurologist-- dr Chantry Headen--- due to alcohol abuse  . GERD (gastroesophageal reflux disease)   . History of esophageal stricture    s/p  dilatation 02-13-2018  . Hypercholesterolemia   . Hypertension    followed by pcp  (nuclear stress test 03-11-2014 low risk w/ no ischemia, ef 65%  . Osteomyelitis of great toe ([place])    left foot s/p amputation 08-14-2019  . Peripheral vascular disease ([place])   . Thrombocytopenia (H[facility])    chronic  . Type 2 diabetes mellitus ([place])    followed by pcp--- last A1c in epic 6.6 on 07-12-2019  . Wound dehiscence, surgical    post left great toe amputation 08-14-2019    Current Outpatient Medications:  .  acetaminophen (TYLENOL) 325 MG tablet, Take 2 tablets (650 mg total) by mouth every 6 (six) hours as needed for mild pain (or Fever >/= 101)., Disp: 20 tablet, Rfl: 0 .   albuterol (PROVENTIL HFA;VENTOLIN HFA) 108 (90 Base) MCG/ACT inhaler, Inhale 2 puffs into the lungs every 6 (six) hours as needed for wheezing or shortness of breath., Disp: 1 Inhaler, Rfl: 2 .  ALPRAZolam (XANAX) 1 MG tablet, Take 1 tablet (1 mg total) by mouth 3 (three) times daily as needed for anxiety., Disp: 10 tablet, Rfl: 0 .  amitriptyline (ELAVIL) 100 MG tablet, Take 100 mg by mouth at bedtime., Disp: , Rfl:  .  atorvastatin (LIPITOR) 40 MG tablet, Take 40 mg by mouth every evening. , Disp: , Rfl: 10 .  docusate sodium (COLACE) 100 MG capsule, Take 100 mg by mouth daily as needed for mild constipation., Disp: , Rfl:  .  DULoxetine (CYMBALTA) 60 MG capsule, Take 60 mg by mouth daily., Disp: , Rfl:  .  ferrous sulfate 325 (65 FE) MG tablet, Take 1 tablet (325 mg total) by mouth daily., Disp: 30 tablet, Rfl: 1 .  gabapentin (NEURONTIN) 300 MG capsule, Take 943m in the morning and at noon, then take 1203mat bedtime (Patient taking differently: Take 900-1,200 mg by mouth See admin instructions. Take 90067mn the morning and at noon, then take 1200m77m bedtime), Disp: 300 capsule, Rfl: 11 .  glimepiride (AMARYL) 4 MG tablet, Take 4 mg by mouth daily., Disp: ,  Rfl:  .  glycopyrrolate (ROBINUL) 1 MG tablet, Take 1 mg by mouth 2 (two) times daily. , Disp: , Rfl: 2 .  hydrOXYzine (ATARAX/VISTARIL) 25 MG tablet, Take 25 mg by mouth 4 (four) times daily as needed for anxiety. , Disp: , Rfl:  .  ibuprofen (ADVIL) 400 MG tablet, Take 1 tablet (400 mg total) by mouth every 8 (eight) hours as needed for moderate pain., Disp: 30 tablet, Rfl: 0 .  insulin aspart (NOVOLOG) 100 UNIT/ML injection, 0-9 Units, Subcutaneous, 3 times daily with meals CBG < 70: Implement Hypoglycemia measures, call MD CBG 70 - 120: 0 units CBG 121 - 150: 1 unit CBG 151 - 200: 2 units CBG 201 - 250: 3 units CBG 251 - 300: 5 units CBG 301 - 350: 7 units CBG 351 - 400: 9 units CBG > 400: call MD, Disp: 10 mL, Rfl: 11 .  lisinopril  (PRINIVIL,ZESTRIL) 20 MG tablet, Take 20 mg by mouth daily. , Disp: , Rfl: 4 .  omeprazole (PRILOSEC) 20 MG capsule, TAKE 1 CAPSULE WITH BREAKFAST AND SUPPER. (Patient taking differently: Take 20 mg by mouth 2 (two) times daily before a meal. ), Disp: 60 capsule, Rfl: 5 .  Oxycodone HCl 10 MG TABS, Take 1 tablet (10 mg total) by mouth every 6 (six) hours as needed., Disp: 10 tablet, Rfl: 0 .  piperacillin-tazobactam (ZOSYN) IVPB, Inject 3.375 g into the vein every 6 (six) hours for 23 days. Indication:  Left foot osteomyelitis Last Day of Therapy:  01/18/2020 Labs - Once weekly:  CBC/D and BMP, Labs - Every other week:  ESR and CRP, Disp: 92 Units, Rfl: 0 .  primidone (MYSOLINE) 50 MG tablet, Take 1 tablet (50 mg total) by mouth 2 (two) times daily. (Patient taking differently: Take 50 mg by mouth 2 (two) times daily. ), Disp: 60 tablet, Rfl: 11 .  VOLTAREN 1 % GEL, Apply 2 g topically 4 (four) times daily as needed (for pain). , Disp: , Rfl:   Social History   Tobacco Use  Smoking Status Former Smoker  . Packs/day: 2.50  . Years: 25.00  . Pack years: 62.50  . Types: Cigarettes  . Quit date: 08/05/1984  . Years since quitting: 35.4  Smokeless Tobacco Never Used    No Known Allergies Objective:  There were no vitals filed for this visit. There is no height or weight on file to calculate BMI. Constitutional Well developed. Well nourished.  Vascular Foot warm and well perfused. Capillary refill normal to all digits.   Neurologic Normal speech. Oriented to person, place, and time. Epicritic sensation to light touch grossly present bilaterally.  Dermatologic  cellulitis present to the incision site.  There is maceration present there is small dehiscence present staples and sutures are held together.  Incision is healing slowly but is progressing in the right direction.  No concern for deep abscess or fluctuance.  No purulent drainage expressed.  Orthopedic:  Mild tenderness to the incision  site.  Cellulitis up to the knee.   Radiographs: None Assessment:   1. Post-operative state   2. Non-compliance   3. DM type 2 with diabetic peripheral neuropathy ([place])    Plan:  Patient was evaluated and treated and all questions answered.      S/p foot surgery left -Progressing as expected post-operatively. -XR: None -WB Status: Nonweightbearing to the left lower extremity with a cam boot -Sutures: Staples are intact without any signs of dehiscence.  No clinical signs of infection  noted.  No active bleeding noted -Medications: None -Foot redressed. -Patient was very noncompliant in his ambulatory status.  He has been ambulating with a cam boot.  He does have a knee scooter at bedside.  However patient left the nursing facility with PICC line still in place. -Clinically at this time I am not concerned for deep abscess or purulent drainage from the incision site.  However he does have skin and soft tissue infection with cellulitis extending up to the level of the knee.  Patient will need IV antibiotics for next 48 hours and monitor the resolve meant of cellulitis. -He is immediately taken to the emergency room by his family for admission for IV antibiotics for next at least 48 to 72 hours. -At this time no surgical intervention is needed given clinically I was not able to decompress any purulent drainage or palpation any pocket of abscess formation. -If patient is infection worsens or has any worsening symptoms systematic signs of sepsis, I am worried that patient will and underwent a below the knee amputation.  Patient is a high risk for below the knee amputation of the left side.    No follow-ups on file.

## 2020-01-17 NOTE — Progress Notes (Addendum)
Pharmacy Antibiotic Note  Eugene Diaz is a 54 y.o. male admitted on 01/17/2020 with sepsis. Pharmacy has been consulted for vancomycin and Zosyn dosing.  Pt was recently admitted and to continue on Zosyn at a SNF through 01/18/20. He left AMA and presented to the ED with worsening foot infection.  WBC 6.5, Tmax 103.1, lactate 1.3.  Plan: Vancomycin 2500mg  IV x1 then 1000mg  IV Q12h Goal AUC 400-550 Expected AUC: 475 SCr used: 1.26 Zosyn 3.375g IV x1 over 30 minutes then 3.375g IV q8h (4 hour infusion) F/u podiatry plans F/u clinical progress, c/s, and LOT.   Height: 6\' 2"  (188 cm) Weight: 268 lb 15.4 oz (122 kg) IBW/kg (Calculated) : 82.2  Temp (24hrs), Avg:103.1 F (39.5 C), Min:102.5 F (39.2 C), Max:103.6 F (39.8 C)  Recent Labs  Lab 01/17/20 1518  WBC 6.5  CREATININE 1.26*  LATICACIDVEN 1.3    Estimated Creatinine Clearance: 93 mL/min (A) (by C-G formula based on SCr of 1.26 mg/dL (H)).    No Known Allergies  Antimicrobials this admission: 3/19 Zosyn >>  3/19 Vanc >>  Dose adjustments this admission: N/A  Microbiology results: 3/19 BCx: sent 3/19 UCx: sent   Thank you for allowing pharmacy to be a part of this patient's care.  Kennon Holter, PharmD PGY1 Ambulatory Care Pharmacy Resident 01/17/2020 6:15 PM

## 2020-01-17 NOTE — Telephone Encounter (Signed)
[facility] - Astrid Divine states she spoke wth [facility] and pt was sent home with IV antibiotic, and left their center AMA, so has not [place] set up. Olin Hauser states pt's antibiotics are to run to tomorrow's end date and he was given the medication to run. [facility] - Edwena Blow states she tried 8-9 [place] agencies and they have refused pt due to his non-compliance. [facility] - Olin Hauser states if pt is to continue the Zosyn IV, send rx to her and she will send to their infusion agency [facility] and they will run the IV and perform Wound car.

## 2020-01-17 NOTE — ED Triage Notes (Signed)
Pt sent by foot doc due to possible infection. Pt had amputation to left foot. Pt has PICC line in place but unsure of when it was last used. Pt has fever and tachycardiac in triage.

## 2020-01-17 NOTE — Telephone Encounter (Signed)
Community Message to [facility] - P. Chandler requesting how best to get IV antibiotic orders and wound care [place] set up.

## 2020-01-18 LAB — LACTIC ACID, PLASMA: Lactic Acid, Venous: 0.7 mmol/L (ref 0.5–1.9)

## 2020-01-18 LAB — URINALYSIS, ROUTINE W REFLEX MICROSCOPIC
Bilirubin Urine: NEGATIVE
Glucose, UA: NEGATIVE mg/dL
Ketones, ur: NEGATIVE mg/dL
Leukocytes,Ua: NEGATIVE
Nitrite: NEGATIVE
Protein, ur: 30 mg/dL — AB
Specific Gravity, Urine: 1.015 (ref 1.005–1.030)
pH: 5 (ref 5.0–8.0)

## 2020-01-18 LAB — COMPREHENSIVE METABOLIC PANEL
ALT: 9 U/L (ref 0–44)
AST: 12 U/L — ABNORMAL LOW (ref 15–41)
Albumin: 2.4 g/dL — ABNORMAL LOW (ref 3.5–5.0)
Alkaline Phosphatase: 46 U/L (ref 38–126)
Anion gap: 8 (ref 5–15)
BUN: 13 mg/dL (ref 6–20)
CO2: 23 mmol/L (ref 22–32)
Calcium: 8.3 mg/dL — ABNORMAL LOW (ref 8.9–10.3)
Chloride: 105 mmol/L (ref 98–111)
Creatinine, Ser: 0.88 mg/dL (ref 0.61–1.24)
GFR calc Af Amer: >60 mL/min (ref >60)
GFR calc non Af Amer: >60 mL/min (ref >60)
Glucose, Bld: 161 mg/dL — ABNORMAL HIGH (ref 70–99)
Potassium: 4 mmol/L (ref 3.5–5.1)
Sodium: 136 mmol/L (ref 135–145)
Total Bilirubin: 0.8 mg/dL (ref 0.3–1.2)
Total Protein: 5.6 g/dL — ABNORMAL LOW (ref 6.5–8.1)

## 2020-01-18 LAB — GLUCOSE, CAPILLARY
Glucose-Capillary: 129 mg/dL — ABNORMAL HIGH (ref 70–99)
Glucose-Capillary: 150 mg/dL — ABNORMAL HIGH (ref 70–99)
Glucose-Capillary: 194 mg/dL — ABNORMAL HIGH (ref 70–99)
Glucose-Capillary: 188 mg/dL — ABNORMAL HIGH (ref 70–99)

## 2020-01-18 LAB — HEMOGLOBIN A1C
Hgb A1c MFr Bld: 4.7 % — ABNORMAL LOW (ref 4.8–5.6)
Mean Plasma Glucose: 88.19 mg/dL

## 2020-01-18 LAB — MAGNESIUM: Magnesium: 1.7 mg/dL (ref 1.7–2.4)

## 2020-01-18 LAB — SEDIMENTATION RATE: Sed Rate: 40 mm/hr — ABNORMAL HIGH (ref 0–16)

## 2020-01-18 LAB — MRSA PCR SCREENING: MRSA by PCR: NEGATIVE

## 2020-01-18 LAB — PHOSPHORUS: Phosphorus: 3.9 mg/dL (ref 2.5–4.6)

## 2020-01-18 MED ORDER — CHLORHEXIDINE GLUCONATE CLOTH 2 % EX PADS
6.0000 | MEDICATED_PAD | Freq: Every day | CUTANEOUS | Status: DC
  Administered 2020-01-18 – 2020-01-21 (×4): 6 via TOPICAL

## 2020-01-18 NOTE — Progress Notes (Signed)
PROGRESS NOTE  Eugene Diaz [id number] DOB: 1966/03/01 DOA: 01/17/2020 PCP: [name], PA-C   LOS: 1 day   Brief Narrative / Interim history: 54 year old male history of alcohol abuse, type 2 diabetes mellitus, poorly controlled, history of left foot osteomyelitis status post left foot Chopart's amputation in February 2020 at that time 4 weeks of Zosyn was recommended through PICC line, postop course complicated by several hospitalizations for bleeding from amputation site requiring blood transfusion, recently seen by podiatry Dr. [name] in office on 3/19, noted to have worsening cellulitis and elected for admission for IV antibiotics.  Apparently he was in a nursing facility with a PICC line and left the nursing facility and has been noncompliant with nonweightbearing on the left foot.  He was febrile in the outpatient office 3/19 with a fever 103.6, tachycardic.  Subjective / 24h Interval events: With groggy this morning, sleeping, but wakes up easily and has no complaints.  States that his foot pain is improved.  Assessment & Plan: Principal Problem Sepsis due to left foot wound infection -Recently seen by podiatry as an outpatient on 2/19, continue broad-spectrum antibiotics today, fever curve is improving, blood cultures negative -Prior microbiology reviewed, the left foot culture in February showed Enterobacter Cloacae, MSSA, Enterococcus faecalis, Bacteroides -If fever recurs will need an MRI of the foot, podiatry consultation  Active Problems Type 2 diabetes mellitus -Continue sliding scale, CBGs controlled  CBG (last 3)  Recent Labs    01/18/20 0825  GLUCAP 129*   History of alcohol abuse -Last alcoholic drink was 2 days ago, no clear-cut withdrawal symptoms this morning, continue CIWA  Diabetic neuropathy -Continue home medications  History of COPD -No wheezing, no exacerbation, respiratory status is stable, continue albuterol as  needed  Hyperlipidemia -continue atorvastatin   Scheduled Meds: . amitriptyline  100 mg Oral QHS  . atorvastatin  40 mg Oral QPM  . Chlorhexidine Gluconate Cloth  6 each Topical Daily  . DULoxetine  60 mg Oral Daily  . enoxaparin (LOVENOX) injection  40 mg Subcutaneous Q24H  . ferrous sulfate  325 mg Oral Daily  . folic acid  1 mg Oral Daily  . gabapentin  1,200 mg Oral QHS  . gabapentin  900 mg Oral 2 times per day  . insulin aspart  0-9 Units Subcutaneous TID WC  . multivitamin with minerals  1 tablet Oral Daily  . pantoprazole  40 mg Oral Daily  . primidone  50 mg Oral BID  . thiamine  100 mg Oral Daily   Or  . thiamine  100 mg Intravenous Daily   Continuous Infusions: . sodium chloride 100 mL/hr at 01/18/20 0323  . piperacillin-tazobactam (ZOSYN)  IV 3.375 g (01/18/20 0750)  . vancomycin     PRN Meds:.acetaminophen, albuterol, ALPRAZolam, hydrOXYzine, LORazepam **OR** LORazepam, ondansetron **OR** ondansetron (ZOFRAN) IV, oxyCODONE  DVT prophylaxis: Lovenox Code Status: Full code Family Communication: No family at bedside, discussed with patient Patient admitted from: Home Anticipated d/c place: To be determined, PT evaluation pending Barriers to d/c: Persistent cellulitis, continue intravenous antibiotics  Consultants:  None  Procedures:  None  Microbiology  Blood cultures 3/19 - NGTD  Antimicrobials: Vancomycin 3/19 >> Zosyn 3/19 >>   Objective: Vitals:   01/17/20 2147 01/17/20 2209 01/18/20 0019 01/18/20 0300  BP: 137/68  140/67 138/66  Pulse: 90  87 87  Resp: 20  16 16   Temp: 99.3 F (37.4 C)  99.2 F (37.3 C) 98.8 F (37.1 C)  TempSrc: Oral  Oral  SpO2: 100%  100% 100%  Weight: 129.6 kg     Height:  6\' 2"  (1.88 m)      Intake/Output Summary (Last 24 hours) at 01/18/2020 1101 Last data filed at 01/18/2020 0346 Gross per 24 hour  Intake 1191.44 ml  Output 600 ml  Net 591.44 ml   Filed Weights   01/17/20 1509 01/17/20 2147  Weight: 122  kg 129.6 kg    Examination:  Constitutional: NAD Eyes: no scleral icterus ENMT: Mucous membranes are moist.  Respiratory: clear to auscultation bilaterally, no wheezing, no crackles.  Cardiovascular: Regular rate and rhythm, no murmurs / rubs / gallops.  Abdomen: non distended, no tenderness. Bowel sounds positive.  Musculoskeletal: no clubbing / cyanosis.  Skin: LE wrapped, cellulitic area extending just below knee Neurologic: non focal   Data Reviewed: I have independently reviewed following labs and imaging studies   CBC: Recent Labs  Lab 01/17/20 1518  WBC 6.5  NEUTROABS 5.2  HGB 9.7*  HCT 30.9*  MCV 94.8  PLT 0000000   Basic Metabolic Panel: Recent Labs  Lab 01/17/20 1518 01/17/20 2330  NA 135  --   K 4.5  --   CL 99  --   CO2 24  --   GLUCOSE 194*  --   BUN 16  --   CREATININE 1.26*  --   CALCIUM 9.2  --   MG  --  1.7  PHOS  --  3.9   Liver Function Tests: Recent Labs  Lab 01/17/20 1518  AST 14*  ALT 10  ALKPHOS 62  BILITOT 1.1  PROT 6.9  ALBUMIN 3.3*   Coagulation Profile: Recent Labs  Lab 01/17/20 1633  INR 1.1   HbA1C: Recent Labs    01/17/20 2311  HGBA1C 4.7*   CBG: Recent Labs  Lab 01/18/20 0825  GLUCAP 129*    Recent Results (from the past 240 hour(s))  Blood culture (routine x 2)     Status: None (Preliminary result)   Collection Time: 01/17/20  3:15 PM   Specimen: Site Not Specified; Blood  Result Value Ref Range Status   Specimen Description SITE NOT SPECIFIED  Final   Special Requests   Final    BOTTLES DRAWN AEROBIC AND ANAEROBIC Blood Culture adequate volume   Culture   Final    NO GROWTH < 24 HOURS Performed at [facility], [address] [place], [place] [address]    Report Status PENDING  Incomplete  Blood culture (routine x 2)     Status: None (Preliminary result)   Collection Time: 01/17/20  3:22 PM   Specimen: BLOOD  Result Value Ref Range Status   Specimen Description BLOOD SITE NOT SPECIFIED   Final   Special Requests   Final    BOTTLES DRAWN AEROBIC AND ANAEROBIC Blood Culture adequate volume   Culture  Setup Time   Final    ANAEROBIC BOTTLE ONLY GRAM POSITIVE RODS CRITICAL RESULT CALLED TO, READ BACK BY AND VERIFIED WITH: K AMEND PHARMD 01/18/20 0550 JDW    Culture   Final    NO GROWTH < 12 HOURS Performed at [facility], [place] [address] [place], [place] [address]    Report Status PENDING  Incomplete  SARS CORONAVIRUS 2 (TAT 6-24 HRS) Nasopharyngeal Nasopharyngeal Swab     Status: None   Collection Time: 01/17/20  4:54 PM   Specimen: Nasopharyngeal Swab  Result Value Ref Range Status   SARS Coronavirus 2 NEGATIVE  NEGATIVE Final    Comment: (NOTE) SARS-CoV-2 target nucleic acids are NOT DETECTED. The SARS-CoV-2 RNA is generally detectable in upper and lower respiratory specimens during the acute phase of infection. Negative results do not preclude SARS-CoV-2 infection, do not rule out co-infections with other pathogens, and should not be used as the sole basis for treatment or other patient management decisions. Negative results must be combined with clinical observations, patient history, and epidemiological information. The expected result is Negative. Fact Sheet for Patients: SugarRoll.be Fact Sheet for Healthcare Providers: https://www.woods-mathews.com/ This test is not yet approved or cleared by the [place] FDA and  has been authorized for detection and/or diagnosis of SARS-CoV-2 by FDA under an Emergency Use Authorization (EUA). This EUA will remain  in effect (meaning this test can be used) for the duration of the COVID-19 declaration under Section 56 4(b)(1) of the Act, 21 U.S.C. section 360bbb-3(b)(1), unless the authorization is terminated or revoked sooner. Performed at [facility], [place] [address], [place], [place] [address]   MRSA PCR Screening     Status: None   Collection Time: 01/17/20   9:35 PM   Specimen: Nasopharyngeal  Result Value Ref Range Status   MRSA by PCR NEGATIVE NEGATIVE Final    Comment:        The GeneXpert MRSA Assay (FDA approved for NASAL specimens only), is one component of a comprehensive MRSA colonization surveillance program. It is not intended to diagnose MRSA infection nor to guide or monitor treatment for MRSA infections. Performed at [facility], [place] [address], [place], [place] [address]      Radiology Studies: DG Foot Complete Left  Result Date: 01/17/2020 CLINICAL DATA:  Patient status post amputation of the left foot at the level of the calcaneus and talus 12/21/2019. EXAM: LEFT FOOT - COMPLETE 3+ VIEW COMPARISON:  Plain films left foot 01/04/2020 and 12/17/2019. FINDINGS: Surgical staples remain in place. Amputation at the level of the talus and calcaneus again seen. Soft tissues at the amputation site appear swollen. No bony destructive change is identified. No soft tissue gas is identified. No tibiotalar joint effusion. IMPRESSION: Status post amputation as described above. Soft tissues of the amputation appear swollen. The exam is otherwise negative. Electronically Signed   By: Inge Rise M.D.   On: 01/17/2020 15:39    Marzetta Board, MD, PhD [facility]  Between 7 am - 7 pm I am available, please contact me via Amion or Securechat  Between 7 pm - 7 am I am not available, please contact night coverage MD/APP via Amion

## 2020-01-18 NOTE — Evaluation (Signed)
Physical Therapy Evaluation Patient Details Name: Eugene Diaz MRN: [id number] DOB: 1966-03-09 Today's Date: 01/18/2020   History of Present Illness  Pt is a 54 y.o. male admitted 01/17/20 with worsening L foot cellulitis/infection resulting in sepsis; had visited podiatrists' office 3/19 and opted to be admitted for IV antibiotics. 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    Clinical Impression  Pt presents with an overall decrease in functional mobility secondary to above. PTA, pt lives alone, ambulatory with RW or crutches, having increased difficulty caring for self and mobilizing due to L foot issues. Today, pt requiring close min guard for minimal mobility with RW; pt becoming pale and diaphoretic with upright mobility (RN notified); BP 143/82, HR 89. Pt with difficulty maintaining LLE NWB precautions; has been WB with L boot on at home. Pt would benefit from continued acute PT services to maximize functional mobility and independence prior to d/c with SNF-level therapies. Pt not interested in returning to [facility].     Follow Up Recommendations SNF;Supervision - Intermittent    Equipment Recommendations  None recommended by PT    Recommendations for Other Services       Precautions / Restrictions Precautions Precautions: Fall Restrictions Other Position/Activity Restrictions: Assume L foot NWB (pt bears weight with L foot in boot)      Mobility  Bed Mobility Overal bed mobility: Independent Bed Mobility: Supine to Sit              Transfers Overall transfer level: Needs assistance Equipment used: Rolling walker (2 wheeled) Transfers: Sit to/from Stand Sit to Stand: Min guard         General transfer comment: Min  guard for balance, pt unable to maintain LLE NWB precautions; WB in boot  Ambulation/Gait Ambulation/Gait assistance: Min guard Gait Distance (Feet): 4 Feet Assistive device: Rolling walker (2 wheeled) Gait Pattern/deviations: Step-to pattern Gait velocity: Decreased   General Gait Details: Steps to recliner with RW and close min guard, WB thru L foot in boot; pt diaphoretic with pallor; BP 143/82, HR 89  Stairs            Wheelchair Mobility    Modified Rankin (Stroke Patients Only)       Balance Overall balance assessment: Needs assistance Sitting-balance support: Feet supported Sitting balance-Leahy Scale: Good Sitting balance - Comments: Increased time to don R sock and L boot   Standing balance support: Bilateral upper extremity supported Standing balance-Leahy Scale: Poor Standing balance comment: Reliant on UE support                             Pertinent Vitals/Pain Pain Assessment: Faces Faces Pain Scale: Hurts a little bit Pain Location: L foot, generalized Pain Descriptors / Indicators: Discomfort Pain Intervention(s): Monitored during session    Home Living Family/patient expects to be discharged to:: Private residence Living Arrangements: Alone Available Help at Discharge: Friend(s);Family;Available PRN/intermittently Type of Home: Mobile home Home Access: Stairs to enter Entrance Stairs-Rails: None Entrance Stairs-Number of Steps: 1 Home Layout: One level Home Equipment: Cane - single point;Crutches;Walker - 2 wheels;Other (comment)(knee scooter) Additional Comments: Mom watching pt's dog while he is admitted    Prior Function Level of Independence: Independent with  assistive device(s)         Comments: ambulates with either crutches or RW; washes up at sink     Hand Dominance        Extremity/Trunk Assessment   Upper Extremity Assessment Upper Extremity Assessment: Overall WFL for tasks assessed    Lower Extremity  Assessment Lower Extremity Assessment: Generalized weakness;LLE deficits/detail LLE Deficits / Details: 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/2019); hip and knee functionally >3/5    Cervical / Trunk Assessment Cervical / Trunk Assessment: Normal  Communication   Communication: No difficulties  Cognition Arousal/Alertness: Awake/alert Behavior During Therapy: WFL for tasks assessed/performed Overall Cognitive Status: Within Functional Limits for tasks assessed                                        General Comments General comments (skin integrity, edema, etc.): Does not want to return to [facility] for rehab    Exercises     Assessment/Plan    PT Assessment Patient needs continued PT services  PT Problem List Decreased strength;Decreased range of motion;Decreased activity tolerance;Decreased mobility;Decreased balance;Decreased coordination;Decreased knowledge of use of DME;Decreased knowledge of precautions;Pain       PT Treatment Interventions DME instruction;Gait training;Stair training;Functional mobility training;Therapeutic activities;Therapeutic exercise;Balance training;Neuromuscular re-education;Patient/family education    PT Goals (Current goals can be found in the Care Plan section)  Acute Rehab PT Goals Patient Stated Goal: Feel better PT Goal Formulation: With patient Time For Goal Achievement: 02/01/20 Potential to Achieve Goals: Good    Frequency Min 3X/week   Barriers to discharge        Co-evaluation               AM-PAC PT "6 Clicks" Mobility  Outcome Measure Help needed turning from your back to your side while in a flat bed without using bedrails?: None Help needed moving from lying on your back to sitting on the side of a flat bed without using bedrails?: None Help needed moving to and from a bed to a chair (including a wheelchair)?: A Little Help needed standing up from a chair using your arms (e.g., wheelchair or bedside chair)?: A  Little Help needed to walk in hospital room?: A Little Help needed climbing 3-5 steps with a railing? : A Lot 6 Click Score: 19    End of Session Equipment Utilized During Treatment: Gait belt Activity Tolerance: Treatment limited secondary to medical complications (Comment)(pallor, sweating, feeling poorly) Patient left: in chair;with call bell/phone within reach;with chair alarm set Nurse Communication: Mobility status PT Visit Diagnosis: Other abnormalities of gait and mobility (R26.89);Muscle weakness (generalized) (M62.81);Pain    Time: [id number] PT Time Calculation (min) (ACUTE ONLY): 31 min   Charges:   PT Evaluation $PT Eval Moderate Complexity: 1 Mod PT Treatments $Therapeutic Activity: 8-22 mins   Mabeline Caras, PT, DPT Acute Rehabilitation Services  Pager [phone] Office [place] 01/18/2020, 4:57 PM

## 2020-01-18 NOTE — Progress Notes (Signed)
PHARMACY - PHYSICIAN COMMUNICATION CRITICAL VALUE ALERT - BLOOD CULTURE IDENTIFICATION (BCID)  Eugene Diaz is an 54 y.o. male who presented to [facility] on 01/17/2020 with a chief complaint of LLE cellulitis  Assessment:  : Lab called and pt growing GPR in 1/4 blood cultures. Could be contaminant. Nothing on BCID so will await further growth.   Name of physician (or Provider) Contacted: Dr. Kennon Holter  Current antibiotics: Zosyn and Vanc  Changes to prescribed antibiotics recommended:  Await further growth to narrow abx  No results found for this or any previous visit.  Sherlon Handing, PharmD, BCPS Please see amion for complete clinical pharmacist phone list 01/18/2020  5:51 AM

## 2020-01-19 ENCOUNTER — Encounter (HOSPITAL_COMMUNITY): Payer: Medicaid Other

## 2020-01-19 LAB — CBC
HCT: 24.3 % — ABNORMAL LOW (ref 39.0–52.0)
HCT: 24.1 % — ABNORMAL LOW (ref 39.0–52.0)
Hemoglobin: 7.7 g/dL — ABNORMAL LOW (ref 13.0–17.0)
Hemoglobin: 7.7 g/dL — ABNORMAL LOW (ref 13.0–17.0)
MCH: 28.9 pg (ref 26.0–34.0)
MCH: 29.5 pg (ref 26.0–34.0)
MCHC: 31.7 g/dL (ref 30.0–36.0)
MCHC: 32 g/dL (ref 30.0–36.0)
MCV: 91.4 fL (ref 80.0–100.0)
MCV: 92.3 fL (ref 80.0–100.0)
Platelets: 126 10*3/uL — ABNORMAL LOW (ref 150–400)
Platelets: 152 10*3/uL (ref 150–400)
RBC: 2.66 MIL/uL — ABNORMAL LOW (ref 4.22–5.81)
RBC: 2.61 MIL/uL — ABNORMAL LOW (ref 4.22–5.81)
RDW: 14 % (ref 11.5–15.5)
RDW: 14 % (ref 11.5–15.5)
WBC: 3.4 10*3/uL — ABNORMAL LOW (ref 4.0–10.5)
WBC: 4 10*3/uL (ref 4.0–10.5)
nRBC: 0 % (ref 0.0–0.2)
nRBC: 0 % (ref 0.0–0.2)

## 2020-01-19 LAB — GLUCOSE, CAPILLARY
Glucose-Capillary: 149 mg/dL — ABNORMAL HIGH (ref 70–99)
Glucose-Capillary: 232 mg/dL — ABNORMAL HIGH (ref 70–99)
Glucose-Capillary: 222 mg/dL — ABNORMAL HIGH (ref 70–99)
Glucose-Capillary: 300 mg/dL — ABNORMAL HIGH (ref 70–99)

## 2020-01-19 LAB — COMPREHENSIVE METABOLIC PANEL
ALT: 10 U/L (ref 0–44)
AST: 13 U/L — ABNORMAL LOW (ref 15–41)
Albumin: 2.4 g/dL — ABNORMAL LOW (ref 3.5–5.0)
Alkaline Phosphatase: 48 U/L (ref 38–126)
Anion gap: 8 (ref 5–15)
BUN: 15 mg/dL (ref 6–20)
CO2: 24 mmol/L (ref 22–32)
Calcium: 8.7 mg/dL — ABNORMAL LOW (ref 8.9–10.3)
Chloride: 103 mmol/L (ref 98–111)
Creatinine, Ser: 0.97 mg/dL (ref 0.61–1.24)
GFR calc Af Amer: >60 mL/min (ref >60)
GFR calc non Af Amer: >60 mL/min (ref >60)
Glucose, Bld: 191 mg/dL — ABNORMAL HIGH (ref 70–99)
Potassium: 4 mmol/L (ref 3.5–5.1)
Sodium: 135 mmol/L (ref 135–145)
Total Bilirubin: 0.4 mg/dL (ref 0.3–1.2)
Total Protein: 5.9 g/dL — ABNORMAL LOW (ref 6.5–8.1)

## 2020-01-19 LAB — C-REACTIVE PROTEIN: CRP: 14.1 mg/dL — ABNORMAL HIGH (ref <1.0)

## 2020-01-19 NOTE — Evaluation (Signed)
Occupational Therapy Evaluation Patient Details Name: Eugene Diaz MRN: [id number] DOB: Jun 23, 1966 Today's Date: 01/19/2020    History of Present Illness Pt is a 54 y.o. male admitted 01/17/20 with worsening L foot cellulitis/infection resulting in sepsis; had visited podiatrists' office 3/19 and opted to be admitted for IV antibiotics. 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   Clinical Impression   Pt PTA: pt living at home alone and reports independence. Pt has a history of medical noncompliance and recently with a toe amputation on LLE with NWB status and CAM boot issued. Pt able to hop a few steps, but often pt ambulating on it with RW regardless of cueing to adhere. Pt minguardA overall for ADL. Pt would benefit from continued OT skilled services for ADL and safety. OT following acutely.    Follow Up Recommendations  SNF(pt may refuse)    Equipment Recommendations  3 in 1 bedside commode(3in1 for wider adult)    Recommendations for Other Services       Precautions / Restrictions Precautions Precautions: Fall Restrictions Weight Bearing Restrictions: Yes LLE Weight Bearing: Non weight bearing Other Position/Activity Restrictions: Assume L foot NWB (pt bears weight with L foot in boot)      Mobility Bed Mobility Overal bed mobility: Independent                Transfers Overall transfer level: Needs assistance Equipment used: Rolling walker (2 wheeled) Transfers: Sit to/from [facility] Sit to Stand: Min guard Stand pivot transfers: Min guard       General transfer comment: Pt taking RW into small bathroom, ptunable to hop around BSC so pt unable to maintain WB status in bathroom; pt insistent upon using  real commode instead of BSC.    Balance Overall balance assessment: Needs assistance Sitting-balance support: Feet supported Sitting balance-Leahy Scale: Good     Standing balance support: Bilateral upper extremity supported Standing balance-Leahy Scale: Poor Standing balance comment: Reliant on UE support                           ADL either performed or assessed with clinical judgement   ADL Overall ADL's : Needs assistance/impaired     Grooming: Min guard;Standing;Supervision/safety;Sitting Grooming Details (indicate cue type and reason): Pt fatigues easily trying to perform NWB on LLE. Upper Body Bathing: Set up;Sitting   Lower Body Bathing: Set up;Sitting/lateral leans           Toilet Transfer: Min guard;Ambulation Toilet Transfer Details (indicate cue type and reason): minguard for stability and NWB; hopping from bed to commode Toileting- Clothing Manipulation and Hygiene: Min guard;Sit to/from stand Toileting - Clothing Manipulation Details (indicate cue type and reason): minguard for safety     Functional mobility during ADLs: Min guard;Rolling walker General ADL Comments: Pt with cues required for NWB status; pt performing pericare and bathing UB at sink prior to MD arrival to redress wound. Pt requiring cueing for NWB status     Vision   Vision Assessment?: No apparent visual deficits     Perception     Praxis      Pertinent Vitals/Pain Pain Assessment: No/denies pain Pain Intervention(s): Monitored during session     Hand Dominance Right  Extremity/Trunk Assessment Upper Extremity Assessment Upper Extremity Assessment: Generalized weakness   Lower Extremity Assessment Lower Extremity Assessment: Generalized weakness;LLE deficits/detail LLE Deficits / Details: 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/2019)       Communication Communication Communication: No difficulties   Cognition Arousal/Alertness: Awake/alert Behavior During Therapy: WFL  for tasks assessed/performed Overall Cognitive Status: Within Functional Limits for tasks assessed                                 General Comments: Pt comprehends NWB status, but continues to bear wt   General Comments       Exercises     Shoulder Instructions      Home Living Family/patient expects to be discharged to:: Private residence Living Arrangements: Alone Available Help at Discharge: Friend(s);Family;Available PRN/intermittently Type of Home: Mobile home Home Access: Stairs to enter Entrance Stairs-Number of Steps: 1 Entrance Stairs-Rails: None Home Layout: One level     Bathroom Shower/Tub: Corporate investment banker: Standard Bathroom Accessibility: Yes   Home Equipment: Cane - single point;Crutches;Walker - 2 wheels;Other (comment)   Additional Comments: Mom watching pt's dog while he is admitted      Prior Functioning/Environment Level of Independence: Independent with assistive device(s)        Comments: ambulates with either crutches or RW; washes up at sink        OT Problem List:        OT Treatment/Interventions:      OT Goals(Current goals can be found in the care plan section) Acute Rehab OT Goals Patient Stated Goal: Feel better OT Goal Formulation: With patient Time For Goal Achievement: 02/02/20 Potential to Achieve Goals: Good ADL Goals Pt Will Perform Grooming: with modified independence;standing Pt Will Perform Lower Body Bathing: with modified independence;with adaptive equipment;sit to/from stand Pt Will Perform Lower Body Dressing: with modified independence;sitting/lateral leans;sit to/from stand Pt Will Transfer to Toilet: with modified independence;ambulating Pt Will Perform Toileting - Clothing Manipulation and hygiene: with modified independence;sit to/from stand Pt Will Perform Tub/Shower Transfer: Tub transfer;with modified independence;ambulating;rolling walker;tub bench Additional ADL  Goal #1: Pt will demonstrate independence with adherence to weight bearing precautions during functional mobility and ADL completion.  OT Frequency: Min 2X/week   Barriers to D/C:            Co-evaluation              AM-PAC OT "6 Clicks" Daily Activity     Outcome Measure Help from another person eating meals?: None Help from another person taking care of personal grooming?: A Little Help from another person toileting, which includes using toliet, bedpan, or urinal?: A Little Help from another person bathing (including washing, rinsing, drying)?: A Little Help from another person to put on and taking off regular upper body clothing?: None Help from another person to put on and taking off regular lower body clothing?: A Little 6 Click Score: 20   End of Session Equipment Utilized During Treatment: Gait belt;Rolling walker Nurse Communication: Mobility status  Activity Tolerance: Patient tolerated treatment well Patient left: in chair;with call bell/phone within reach;with chair alarm set  OT Visit Diagnosis: Unsteadiness on feet (R26.81);Other abnormalities of gait and mobility (R26.89);Pain Pain - Right/Left: Left Pain - part of body: Ankle and joints of foot                Time: 1015-1050 OT Time Calculation (min): 35 min Charges:  OT  General Charges $OT Visit: 1 Visit OT Evaluation $OT Eval Moderate Complexity: 1 Mod OT Treatments $Self Care/Home Management : 8-22 mins  Jefferey Pica, OTR/L Acute Rehabilitation Services Pager: [phone] Office: [phone]   Zoei Amison C 01/19/2020, 3:44 PM

## 2020-01-19 NOTE — Progress Notes (Signed)
PROGRESS NOTE  Eugene Diaz [id number] DOB: 03-13-1966 DOA: 01/17/2020 PCP: [name], PA-C   LOS: 2 days   Brief Narrative / Interim history: 54 year old male history of alcohol abuse, type 2 diabetes mellitus, poorly controlled, history of left foot osteomyelitis status post left foot Chopart's amputation in February 2020 at that time 4 weeks of Zosyn was recommended through PICC line, postop course complicated by several hospitalizations for bleeding from amputation site requiring blood transfusion, recently seen by podiatry Dr. [name] in office on 3/19, noted to have worsening cellulitis and elected for admission for IV antibiotics.  Apparently he was in a nursing facility with a PICC line and left the nursing facility and has been noncompliant with nonweightbearing on the left foot.  He was febrile in the outpatient office 3/19 with a fever 103.6, tachycardic.  Subjective / 24h Interval events: Alert this morning, no complaints.  Just worked with occupational therapy.  Tells me that he was in SNF up until a week-10 days ago, and left on his own, and had no antibiotics at home for the past week.  Assessment & Plan: Principal Problem Sepsis due to left foot wound infection -Recently seen by podiatry as an outpatient on 3/19, continue broad-spectrum antibiotics today, fever curve is improving, blood cultures negative -Prior microbiology reviewed, the left foot culture in February showed Enterobacter Cloacae, MSSA, Enterococcus faecalis, Bacteroides -His cellulitis is improving significantly today, continue Zosyn alone and discontinue vancomycin.  He initially was supposed to finish Zosyn on 3/20, however he was at home for a week without any antibiotics, he is improving now and will extend treatment duration for 2 additional weeks and will continue Zosyn until 4/3 -Left lower extremity with significant swelling, obtain venous Doppler -Strongly counseled to avoid any weightbearing,  he has been bearing weight at home suggesting nonadherence to medical recommendations  Active Problems Type 2 diabetes mellitus -Continue sliding scale, CBGs controlled  CBG (last 3)  Recent Labs    01/18/20 1622 01/18/20 2044 01/19/20 0805  GLUCAP 194* 188* 149*   History of alcohol abuse -Last alcoholic drink was 2 days prior to admission, no withdrawal symptoms, continue CIWA  Diabetic neuropathy -Continue home medications  History of COPD -No wheezing, no exacerbation, respiratory status is stable, continue albuterol as needed  Hyperlipidemia -continue atorvastatin   Scheduled Meds: . amitriptyline  100 mg Oral QHS  . atorvastatin  40 mg Oral QPM  . Chlorhexidine Gluconate Cloth  6 each Topical Daily  . DULoxetine  60 mg Oral Daily  . enoxaparin (LOVENOX) injection  40 mg Subcutaneous Q24H  . ferrous sulfate  325 mg Oral Daily  . folic acid  1 mg Oral Daily  . gabapentin  1,200 mg Oral QHS  . gabapentin  900 mg Oral 2 times per day  . insulin aspart  0-9 Units Subcutaneous TID WC  . multivitamin with minerals  1 tablet Oral Daily  . pantoprazole  40 mg Oral Daily  . primidone  50 mg Oral BID  . thiamine  100 mg Oral Daily   Or  . thiamine  100 mg Intravenous Daily   Continuous Infusions: . sodium chloride 100 mL/hr at 01/18/20 0323  . piperacillin-tazobactam (ZOSYN)  IV 3.375 g (01/19/20 [id number])  . vancomycin 1,000 mg (01/19/20 1004)   PRN Meds:.acetaminophen, albuterol, ALPRAZolam, hydrOXYzine, LORazepam **OR** LORazepam, ondansetron **OR** ondansetron (ZOFRAN) IV, oxyCODONE  DVT prophylaxis: Lovenox Code Status: Full code Family Communication: No family at bedside, discussed with patient Patient admitted  from: Home Anticipated d/c place: SNF, patient wishes a different SNF than before Barriers to d/c: Continue intravenous antibiotics, SNF placement pending  Consultants:  None  Procedures:  None  Microbiology  Blood cultures 3/19 -  NGTD  Antimicrobials: Vancomycin 3/19 >> Zosyn 3/19 >>   Objective: Vitals:   01/18/20 1600 01/18/20 2035 01/19/20 0100 01/19/20 0849  BP: 128/70 (!) 148/78 127/72 130/70  Pulse:  88 76 70  Resp: 20 20 20    Temp: 99.8 F (37.7 C) 98.5 F (36.9 C) 98 F (36.7 C) 98.6 F (37 C)  TempSrc: Oral  Oral Oral  SpO2: 100% 100% 100% 100%  Weight:      Height:        Intake/Output Summary (Last 24 hours) at 01/19/2020 1056 Last data filed at 01/19/2020 0700 Gross per 24 hour  Intake 1839.33 ml  Output 2225 ml  Net -385.67 ml   Filed Weights   01/17/20 1509 01/17/20 2147  Weight: 122 kg 129.6 kg    Examination:  Constitutional: No distress, in chair Eyes: No scleral icterus ENMT: Moist mucous membranes Respiratory: Clear to auscultation bilaterally, no wheezing or crackles Cardiovascular: Regular rate and rhythm, no murmurs Abdomen: Soft, nontender, nondistended, bowel sounds positive  Musculoskeletal: no clubbing / cyanosis.  Skin: See pictures below, cellulitis improving Neurologic: non focal   Pictures on 3/21        Pre-hospitalization 3/19     Data Reviewed: I have independently reviewed following labs and imaging studies   CBC: Recent Labs  Lab 01/17/20 1518 01/18/20 1134 01/19/20 0329  WBC 6.5 3.4* 4.0  NEUTROABS 5.2  --   --   HGB 9.7* 7.7* 7.7*  HCT 30.9* 24.3* 24.1*  MCV 94.8 91.4 92.3  PLT 175 126* 0000000   Basic Metabolic Panel: Recent Labs  Lab 01/17/20 1518 01/17/20 2330 01/18/20 1134 01/19/20 0329  NA 135  --  136 135  K 4.5  --  4.0 4.0  CL 99  --  105 103  CO2 24  --  23 24  GLUCOSE 194*  --  161* 191*  BUN 16  --  13 15  CREATININE 1.26*  --  0.88 0.97  CALCIUM 9.2  --  8.3* 8.7*  MG  --  1.7  --   --   PHOS  --  3.9  --   --    Liver Function Tests: Recent Labs  Lab 01/17/20 1518 01/18/20 1134 01/19/20 0329  AST 14* 12* 13*  ALT 10 9 10   ALKPHOS 62 46 48  BILITOT 1.1 0.8 0.4  PROT 6.9 5.6* 5.9*  ALBUMIN 3.3*  2.4* 2.4*   Coagulation Profile: Recent Labs  Lab 01/17/20 1633  INR 1.1   HbA1C: Recent Labs    01/17/20 2311  HGBA1C 4.7*   CBG: Recent Labs  Lab 01/18/20 0825 01/18/20 1250 01/18/20 1622 01/18/20 2044 01/19/20 0805  GLUCAP 129* 150* 194* 188* 149*    Recent Results (from the past 240 hour(s))  Blood culture (routine x 2)     Status: None (Preliminary result)   Collection Time: 01/17/20  3:15 PM   Specimen: Site Not Specified; Blood  Result Value Ref Range Status   Specimen Description SITE NOT SPECIFIED  Final   Special Requests   Final    BOTTLES DRAWN AEROBIC AND ANAEROBIC Blood Culture adequate volume   Culture   Final    NO GROWTH 2 DAYS Performed at [facility], [place]  [address], [place], [place] [address]    Report Status PENDING  Incomplete  Blood culture (routine x 2)     Status: None (Preliminary result)   Collection Time: 01/17/20  3:22 PM   Specimen: BLOOD  Result Value Ref Range Status   Specimen Description BLOOD SITE NOT SPECIFIED  Final   Special Requests   Final    BOTTLES DRAWN AEROBIC AND ANAEROBIC Blood Culture adequate volume   Culture  Setup Time   Final    ANAEROBIC BOTTLE ONLY GRAM POSITIVE RODS CRITICAL RESULT CALLED TO, READ BACK BY AND VERIFIED WITH: K AMEND PHARMD 01/18/20 0550 JDW    Culture   Final    CULTURE REINCUBATED FOR BETTER GROWTH Performed at [facility], [place] [address] [place], [place] [address]    Report Status PENDING  Incomplete  SARS CORONAVIRUS 2 (TAT 6-24 HRS) Nasopharyngeal Nasopharyngeal Swab     Status: None   Collection Time: 01/17/20  4:54 PM   Specimen: Nasopharyngeal Swab  Result Value Ref Range Status   SARS Coronavirus 2 NEGATIVE NEGATIVE Final    Comment: (NOTE) SARS-CoV-2 target nucleic acids are NOT DETECTED. The SARS-CoV-2 RNA is generally detectable in upper and lower respiratory specimens during the acute phase of infection. Negative results do not preclude SARS-CoV-2 infection,  do not rule out co-infections with other pathogens, and should not be used as the sole basis for treatment or other patient management decisions. Negative results must be combined with clinical observations, patient history, and epidemiological information. The expected result is Negative. Fact Sheet for Patients: SugarRoll.be Fact Sheet for Healthcare Providers: https://www.woods-mathews.com/ This test is not yet approved or cleared by the [place] FDA and  has been authorized for detection and/or diagnosis of SARS-CoV-2 by FDA under an Emergency Use Authorization (EUA). This EUA will remain  in effect (meaning this test can be used) for the duration of the COVID-19 declaration under Section 56 4(b)(1) of the Act, 21 U.S.C. section 360bbb-3(b)(1), unless the authorization is terminated or revoked sooner. Performed at [facility], [place] [address] [place], [place] [address]   MRSA PCR Screening     Status: None   Collection Time: 01/17/20  9:35 PM   Specimen: Nasopharyngeal  Result Value Ref Range Status   MRSA by PCR NEGATIVE NEGATIVE Final    Comment:        The GeneXpert MRSA Assay (FDA approved for NASAL specimens only), is one component of a comprehensive MRSA colonization surveillance program. It is not intended to diagnose MRSA infection nor to guide or monitor treatment for MRSA infections. Performed at [facility], [place] [address], [place], [place] [address]      Radiology Studies: No results found.  Marzetta Board, MD, PhD [facility]  Between 7 am - 7 pm I am available, please contact me via Amion or Securechat  Between 7 pm - 7 am I am not available, please contact night coverage MD/APP via Amion

## 2020-01-20 ENCOUNTER — Inpatient Hospital Stay (HOSPITAL_COMMUNITY): Payer: Medicaid Other

## 2020-01-20 DIAGNOSIS — M7989 Other specified soft tissue disorders: Secondary | ICD-10-CM

## 2020-01-20 LAB — GLUCOSE, CAPILLARY
Glucose-Capillary: 234 mg/dL — ABNORMAL HIGH (ref 70–99)
Glucose-Capillary: 226 mg/dL — ABNORMAL HIGH (ref 70–99)
Glucose-Capillary: 261 mg/dL — ABNORMAL HIGH (ref 70–99)

## 2020-01-20 LAB — CULTURE, BLOOD (ROUTINE X 2): Special Requests: ADEQUATE

## 2020-01-20 LAB — URINE CULTURE

## 2020-01-20 MED ORDER — PIPERACILLIN-TAZOBACTAM IV (FOR PTA / DISCHARGE USE ONLY): 13.5000 g | INTRAVENOUS | 0 refills | Status: AC

## 2020-01-20 NOTE — Telephone Encounter (Signed)
Hallelujah! We finally have a plan for poor Eugene Diaz. I saw your notes from Friday and see pt went to ED. Alerted by ID team and will support home IV ABX. We will have HHRN see patient at home on Wednesday. Planned DC is tomorrow, Tuesday and we will provide as a continuous infusion on the Easy Pump (mediball) for once/day change to make easy for his sister. The [place] RN and hospital RN will work with sister today to teach wound care. So thankful we have resolve! Phebe Colla!, communication through [employer] by [place]. [place].

## 2020-01-20 NOTE — Progress Notes (Signed)
PHARMACY CONSULT NOTE FOR:  OUTPATIENT  PARENTERAL ANTIBIOTIC THERAPY (OPAT)  Indication: Left foot osteo Regimen: Zosyn 13.5 gm every 24 hours a as a continuous infusion End date: 02/01/20   IV antibiotic discharge orders are pended. To discharging provider:  please sign these orders via discharge navigator,  Select New Orders & click on the button choice - Manage This Unsigned Work.     Thank you for allowing pharmacy to be a part of this patient's care.  Jimmy Footman, PharmD, BCPS, BCIDP Infectious Diseases Clinical Pharmacist Phone: [phone] 01/20/2020, 12:14 PM

## 2020-01-20 NOTE — TOC Progression Note (Signed)
Transition of Care [facility]) - Progression Note    Patient Details  Name: Eugene Diaz MRN: [id number] Date of Birth: 1966/01/27  Transition of Care [facility]) CM/SW Contact  Pollie Friar, RN Phone Number: 01/20/2020, 11:56 AM  Clinical Narrative:    CM met with the patient and spoke with sister: Mickel Baas over the phone. Mickel Baas is willing to provide needed assist at home. She will be at the hospital today and learn the IV abx administration and wound care. Pam with [employer] is aware and will provide the IV abx education and meds to the home. She is going to arrange [facility] for IV abx follow up and wound care follow up at home.  Bedside RN aware of Mickel Baas being here for education between 3-3:30 pm. Pam will also meet with the daughter at this time.  TOC following.   Expected Discharge Plan: [place] Barriers to Discharge: Continued Medical Work up  Expected Discharge Plan and Services Expected Discharge Plan: [place] In-house Referral: Clinical Social Work Discharge Planning Services: CM Consult   Living arrangements for the past 2 months: [place]                 DME Arranged: 3-N-1 DME Agency: [employer]       HH Arranged: RN, IV Antibiotics(Pam with [employer])           Social Determinants of Health (SDOH) Interventions    Readmission Risk Interventions No flowsheet data found.

## 2020-01-20 NOTE — Progress Notes (Signed)
Physical Therapy Treatment Patient Details Name: Eugene Diaz MRN: [id number] DOB: [birth year] Today's Date: 01/20/2020    History of Present Illness Pt is a 54 y.o. male admitted 01/17/20 with worsening L foot cellulitis/infection resulting in sepsis; had visited podiatrists' office 3/19 and opted to be admitted for IV antibiotics. 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    PT Comments    Pt received in bed, agreeable to participation in therapy. He required min to min guard assist transfers and min guard assist ambulation 25' with RW. Pt able to maintain NWB LLE during gait, but noted to fatigue quickly. Demonstrating 2/4 DOE. Pt reports having a knee scooter at home. He plans to have his mother bring it to the hospital. Continue to recommend SNF at d/c primarily due to concerns of noncompliance with LLE NWB at home. If pt refuses, recommend HHPT and maximize family assist/supervision at home.    Follow Up Recommendations  SNF;Supervision - Intermittent     Equipment Recommendations  None recommended by PT    Recommendations for Other Services       Precautions / Restrictions Precautions Precautions: Fall Restrictions LLE Weight Bearing: Non weight bearing Other Position/Activity Restrictions: no order. NWB per MD progress note    Mobility  Bed Mobility Overal bed mobility: Independent                Transfers Overall transfer level: Needs assistance Equipment used: Rolling walker (2 wheeled) Transfers: Sit to/from Stand Sit to Stand: Min assist         General transfer comment: assist to power up from low surface  Ambulation/Gait Ambulation/Gait assistance: Min guard Gait Distance (Feet): 25 Feet Assistive device:  Rolling walker (2 wheeled) Gait Pattern/deviations: Step-through pattern Gait velocity: Decreased Gait velocity interpretation: <1.31 ft/sec, indicative of household ambulator General Gait Details: Pt able to maintain NWB LLE in CAM boot. 2/4 DOE noted. Fatigues quickly with mobility.   Stairs             Wheelchair Mobility    Modified Rankin (Stroke Patients Only)       Balance Overall balance assessment: Needs assistance Sitting-balance support: Feet supported;No upper extremity supported Sitting balance-Leahy Scale: Good     Standing balance support: Bilateral upper extremity supported;During functional activity Standing balance-Leahy Scale: Poor Standing balance comment: Reliant on UE support                            Cognition Arousal/Alertness: Awake/alert Behavior During Therapy: WFL for tasks assessed/performed Overall Cognitive Status: Within Functional Limits for tasks assessed                                        Exercises      General Comments        Pertinent Vitals/Pain Pain Assessment: No/denies pain    Home Living                      Prior Function            PT Goals (current goals can now be found in the care plan  section) Acute Rehab PT Goals Patient Stated Goal: home Progress towards PT goals: Progressing toward goals    Frequency    Min 3X/week      PT Plan Current plan remains appropriate    Co-evaluation              AM-PAC PT "6 Clicks" Mobility   Outcome Measure  Help needed turning from your back to your side while in a flat bed without using bedrails?: None Help needed moving from lying on your back to sitting on the side of a flat bed without using bedrails?: None Help needed moving to and from a bed to a chair (including a wheelchair)?: A Little Help needed standing up from a chair using your arms (e.g., wheelchair or bedside chair)?: A Little Help needed to walk  in hospital room?: A Little Help needed climbing 3-5 steps with a railing? : A Lot 6 Click Score: 19    End of Session Equipment Utilized During Treatment: Gait belt;Other (comment)(cam boot) Activity Tolerance: Patient tolerated treatment well Patient left: in chair;with call bell/phone within reach;with chair alarm set Nurse Communication: Mobility status PT Visit Diagnosis: Other abnormalities of gait and mobility (R26.89);Muscle weakness (generalized) (M62.81);Pain     Time: [id number] PT Time Calculation (min) (ACUTE ONLY): 28 min  Charges:  $Gait Training: 23-37 mins                     Eugene Diaz, [place]  Office # [phone] Pager [phone]    Eugene Diaz 01/20/2020, 1:38 PM

## 2020-01-20 NOTE — Progress Notes (Signed)
Left lower extremity venous duplex has been completed. Preliminary results can be found in CV Proc through chart review.   01/20/20 11:53 AM Eugene Diaz RVT

## 2020-01-20 NOTE — Progress Notes (Signed)
Pt will require a wide or bariatric size 3 in 1 d/t his body habitus. A regular size 3 in 1 will not provide the support and width he needs.

## 2020-01-20 NOTE — Progress Notes (Signed)
PROGRESS NOTE  ZYKEL OZMENT [id number] DOB: 11-07-1965 DOA: 01/17/2020 PCP: [name], PA-C   LOS: 3 days   Brief Narrative / Interim history: 54 year old male history of alcohol abuse, type 2 diabetes mellitus, poorly controlled, history of left foot osteomyelitis status post left foot Chopart's amputation in February 2020 at that time 4 weeks of Zosyn was recommended through PICC line, postop course complicated by several hospitalizations for bleeding from amputation site requiring blood transfusion, recently seen by podiatry Dr. [name] in office on 3/19, noted to have worsening cellulitis and elected for admission for IV antibiotics.  Apparently he was in a nursing facility with a PICC line and left the nursing facility and has been noncompliant with nonweightbearing on the left foot.  He was febrile in the outpatient office 3/19 with a fever 103.6, tachycardic.  Subjective / 24h Interval events: Feeling better, agreeable to SNF.  No chest pain, no abdominal pain, no nausea or vomiting  Assessment & Plan: Principal Problem Sepsis due to left foot wound infection -Recently seen by podiatry as an outpatient on 3/19, continue broad-spectrum antibiotics today, fever curve is improving, blood cultures negative -Prior microbiology reviewed, the left foot culture in February showed Enterobacter Cloacae, MSSA, Enterococcus faecalis, Bacteroides -His cellulitis is improving significantly today, continue Zosyn alone and discontinue vancomycin.  He initially was supposed to finish Zosyn on 3/20, however he was at home for a week without any antibiotics, he is improving now and will extend treatment duration for 2 additional weeks and will continue Zosyn until 4/3 -Left lower extremity with significant swelling, obtain venous Doppler, pending this morning -Strongly counseled to avoid any weightbearing, he has been bearing weight at home suggesting nonadherence to medical recommendations.   Agreeable to SNF now  Active Problems Type 2 diabetes mellitus -Continue sliding scale, CBGs controlled  CBG (last 3)  Recent Labs    01/19/20 1621 01/19/20 2110 01/20/20 0840  GLUCAP 222* 300* 234*   History of alcohol abuse -Last alcoholic drink was 2 days prior to admission, no withdrawal symptoms, continue CIWA.  Determined to quit, discussed with him today  Diabetic neuropathy -Continue home medications  History of COPD -No wheezing, no exacerbation, respiratory status is stable, continue albuterol as needed  Hyperlipidemia -continue atorvastatin   Scheduled Meds: . amitriptyline  100 mg Oral QHS  . atorvastatin  40 mg Oral QPM  . Chlorhexidine Gluconate Cloth  6 each Topical Daily  . DULoxetine  60 mg Oral Daily  . enoxaparin (LOVENOX) injection  40 mg Subcutaneous Q24H  . ferrous sulfate  325 mg Oral Daily  . folic acid  1 mg Oral Daily  . gabapentin  1,200 mg Oral QHS  . gabapentin  900 mg Oral 2 times per day  . insulin aspart  0-9 Units Subcutaneous TID WC  . multivitamin with minerals  1 tablet Oral Daily  . pantoprazole  40 mg Oral Daily  . primidone  50 mg Oral BID  . thiamine  100 mg Oral Daily   Or  . thiamine  100 mg Intravenous Daily   Continuous Infusions: . sodium chloride 100 mL/hr at 01/18/20 0323  . piperacillin-tazobactam (ZOSYN)  IV 3.375 g (01/20/20 0536)   PRN Meds:.acetaminophen, albuterol, ALPRAZolam, hydrOXYzine, LORazepam **OR** LORazepam, ondansetron **OR** ondansetron (ZOFRAN) IV, oxyCODONE  DVT prophylaxis: Lovenox Code Status: Full code Family Communication: No family at bedside, discussed with patient Patient admitted from: Home Anticipated d/c place: SNF, patient wishes a different SNF than before Barriers to  d/c: To go to SNF when bed becomes available, case was discussed with social worker  Consultants:  None  Procedures:  None  Microbiology  Blood cultures 3/19 - NGTD  Antimicrobials: Vancomycin 3/19 >> Zosyn  3/19 >>   Objective: Vitals:   01/19/20 2013 01/19/20 2251 01/20/20 0004 01/20/20 0829  BP: (!) 144/71 (!) 142/73 119/70 (!) 143/70  Pulse: 68 70 64   Resp: 16 20 14 20   Temp: 98 F (36.7 C) 97.8 F (36.6 C) 97.6 F (36.4 C) 97.7 F (36.5 C)  TempSrc: Oral Oral  Oral  SpO2: 100% 99% 98% 96%  Weight:      Height:        Intake/Output Summary (Last 24 hours) at 01/20/2020 1105 Last data filed at 01/20/2020 0800 Gross per 24 hour  Intake 1042.28 ml  Output 450 ml  Net 592.28 ml   Filed Weights   01/17/20 1509 01/17/20 2147  Weight: 122 kg 129.6 kg    Examination:  Constitutional: NAD, in bed Eyes: No scleral icterus ENMT: Moist mucous membranes Respiratory: Clear bilaterally without wheezing or crackles Cardiovascular: Regular rate and rhythm, no murmurs Abdomen: Soft, NT, ND, bowel sounds positive Musculoskeletal: no clubbing / cyanosis.  Skin: See pictures below, cellulitis improving Neurologic: No focal deficits  Pictures on 3/21        Pre-hospitalization 3/19     Data Reviewed: I have independently reviewed following labs and imaging studies   CBC: Recent Labs  Lab 01/17/20 1518 01/18/20 1134 01/19/20 0329  WBC 6.5 3.4* 4.0  NEUTROABS 5.2  --   --   HGB 9.7* 7.7* 7.7*  HCT 30.9* 24.3* 24.1*  MCV 94.8 91.4 92.3  PLT 175 126* 0000000   Basic Metabolic Panel: Recent Labs  Lab 01/17/20 1518 01/17/20 2330 01/18/20 1134 01/19/20 0329  NA 135  --  136 135  K 4.5  --  4.0 4.0  CL 99  --  105 103  CO2 24  --  23 24  GLUCOSE 194*  --  161* 191*  BUN 16  --  13 15  CREATININE 1.26*  --  0.88 0.97  CALCIUM 9.2  --  8.3* 8.7*  MG  --  1.7  --   --   PHOS  --  3.9  --   --    Liver Function Tests: Recent Labs  Lab 01/17/20 1518 01/18/20 1134 01/19/20 0329  AST 14* 12* 13*  ALT 10 9 10   ALKPHOS 62 46 48  BILITOT 1.1 0.8 0.4  PROT 6.9 5.6* 5.9*  ALBUMIN 3.3* 2.4* 2.4*   Coagulation Profile: Recent Labs  Lab 01/17/20 1633  INR 1.1     HbA1C: Recent Labs    01/17/20 2311  HGBA1C 4.7*   CBG: Recent Labs  Lab 01/19/20 0805 01/19/20 1152 01/19/20 1621 01/19/20 2110 01/20/20 0840  GLUCAP 149* 232* 222* 300* 234*    Recent Results (from the past 240 hour(s))  Urine culture     Status: Abnormal   Collection Time: 01/17/20  2:49 AM   Specimen: In/Out Cath Urine  Result Value Ref Range Status   Specimen Description   Final    IN/OUT CATH URINE Performed at [facility], [place] [address], [place], [place] [address]    Special Requests added [phone] 01/19/2020  Final   Culture MULTIPLE SPECIES PRESENT, SUGGEST RECOLLECTION (A)  Final   Report Status 01/20/2020 FINAL  Final  Blood culture (routine x 2)  Status: None (Preliminary result)   Collection Time: 01/17/20  3:15 PM   Specimen: Site Not Specified; Blood  Result Value Ref Range Status   Specimen Description SITE NOT SPECIFIED  Final   Special Requests   Final    BOTTLES DRAWN AEROBIC AND ANAEROBIC Blood Culture adequate volume   Culture   Final    NO GROWTH 2 DAYS Performed at [facility], [address], [place], [place] [address]    Report Status PENDING  Incomplete  Blood culture (routine x 2)     Status: Abnormal   Collection Time: 01/17/20  3:22 PM   Specimen: BLOOD  Result Value Ref Range Status   Specimen Description BLOOD SITE NOT SPECIFIED  Final   Special Requests   Final    BOTTLES DRAWN AEROBIC AND ANAEROBIC Blood Culture adequate volume   Culture  Setup Time   Final    ANAEROBIC BOTTLE ONLY GRAM POSITIVE RODS CRITICAL RESULT CALLED TO, READ BACK BY AND VERIFIED WITH: K AMEND PHARMD 01/18/20 0550 JDW    Culture (A)  Final    BACILLUS SPECIES Standardized susceptibility testing for this organism is not available. Performed at [facility], [place] [address] [place], [place] [address]    Report Status 01/20/2020 FINAL  Final  SARS CORONAVIRUS 2 (TAT 6-24 HRS) Nasopharyngeal Nasopharyngeal Swab     Status: None    Collection Time: 01/17/20  4:54 PM   Specimen: Nasopharyngeal Swab  Result Value Ref Range Status   SARS Coronavirus 2 NEGATIVE NEGATIVE Final    Comment: (NOTE) SARS-CoV-2 target nucleic acids are NOT DETECTED. The SARS-CoV-2 RNA is generally detectable in upper and lower respiratory specimens during the acute phase of infection. Negative results do not preclude SARS-CoV-2 infection, do not rule out co-infections with other pathogens, and should not be used as the sole basis for treatment or other patient management decisions. Negative results must be combined with clinical observations, patient history, and epidemiological information. The expected result is Negative. Fact Sheet for Patients: SugarRoll.be Fact Sheet for Healthcare Providers: https://www.woods-mathews.com/ This test is not yet approved or cleared by the [place] FDA and  has been authorized for detection and/or diagnosis of SARS-CoV-2 by FDA under an Emergency Use Authorization (EUA). This EUA will remain  in effect (meaning this test can be used) for the duration of the COVID-19 declaration under Section 56 4(b)(1) of the Act, 21 U.S.C. section 360bbb-3(b)(1), unless the authorization is terminated or revoked sooner. Performed at [facility], [place] [address], [place], [place] [address]   MRSA PCR Screening     Status: None   Collection Time: 01/17/20  9:35 PM   Specimen: Nasopharyngeal  Result Value Ref Range Status   MRSA by PCR NEGATIVE NEGATIVE Final    Comment:        The GeneXpert MRSA Assay (FDA approved for NASAL specimens only), is one component of a comprehensive MRSA colonization surveillance program. It is not intended to diagnose MRSA infection nor to guide or monitor treatment for MRSA infections. Performed at [facility], [place] [address], [place], [place] [address]      Radiology Studies: No results found.  Marzetta Board, MD,  PhD [facility]  Between 7 am - 7 pm I am available, please contact me via Amion or Securechat  Between 7 pm - 7 am I am not available, please contact night coverage MD/APP via Amion

## 2020-01-20 NOTE — Plan of Care (Signed)

## 2020-01-20 NOTE — Consult Note (Signed)
[place] Nurse wound consult note Consultation was completed by review of records, images and assistance from the bedside nurse/clinical staff.   Reason for Consult: LLE amputation site; surgery date 12/21/19 Wound type: surgical wound Pressure Injury POA: NA Measurement: see nursing flowsheet Wound [id number] areas of pale pink skin separation; minimal yellow slough at the most medial aspect of the surgical incision  Drainage (amount, consistency, odor) see nursing flow sheet  Periwound: intact with sutures in place  Dressing procedure/placement/frequency: Secure chat orders received for silver hydrofiber, dry dressing and compression wrap. Change daily. Per [name].    Re consult if needed, will not follow at this time. Thanks  Pankaj Haack [employer], RN,CWOCN, CNS, [place] [phone])

## 2020-01-21 DIAGNOSIS — I10 Essential (primary) hypertension: Secondary | ICD-10-CM

## 2020-01-21 DIAGNOSIS — E1152 Type 2 diabetes mellitus with diabetic peripheral angiopathy with gangrene: Secondary | ICD-10-CM

## 2020-01-21 LAB — GLUCOSE, CAPILLARY
Glucose-Capillary: 216 mg/dL — ABNORMAL HIGH (ref 70–99)
Glucose-Capillary: 148 mg/dL — ABNORMAL HIGH (ref 70–99)
Glucose-Capillary: 262 mg/dL — ABNORMAL HIGH (ref 70–99)

## 2020-01-21 MED ORDER — OXYCODONE HCL 10 MG PO TABS: 10.0000 mg | ORAL_TABLET | Freq: Four times a day (QID) | ORAL | 0 refills | Status: DC | PRN

## 2020-01-21 MED ORDER — PIPERACILLIN-TAZOBACTAM 3.375 G IVPB
3.3750 g | Freq: Three times a day (TID) | INTRAVENOUS | Status: DC
  Filled 2020-01-21: qty 50

## 2020-01-21 MED ORDER — ALPRAZOLAM 1 MG PO TABS: 0.5000 mg | ORAL_TABLET | Freq: Four times a day (QID) | ORAL | 0 refills | Status: DC | PRN

## 2020-01-21 NOTE — Care Management Important Message (Signed)
Important Message  Patient Details  Name: Eugene Diaz MRN: [id number] Date of Birth: [birth year]   Medicare Important Message Given:  Yes     [name] [address] 01/21/2020, 3:55 PM

## 2020-01-21 NOTE — Discharge Summary (Signed)
Physician Discharge Summary  Eugene Diaz FKC:[id number] DOB: Nov 14, 1965 DOA: 01/17/2020  PCP: [name], PA-C  Admit date: 01/17/2020 Discharge date: 01/21/2020  Admitted From: home Disposition:  Home (refusing SNF)  Recommendations for Outpatient Follow-up:  1. Follow up with podiatry in 1 to 2 weeks  Home Health: RN Equipment/Devices: Home PICC line  Discharge Condition: Stable CODE STATUS: Full code Diet recommendation: Heart healthy diet  HPI: Per admitting MD, Eugene Diaz  is a 54 y.o. male, with history of alcohol abuse, diabetes mellitus type 2 for increasing to marked hypotension done by Dr. Boneta Lucks, was seen in his office today and was sent to the ED for evaluation of fever and tachycardia.  Not obtainable patient left AMA from skilled nursing facility after getting IV antibiotics at the facility.  Patient was getting Zosyn as outpatient basis as Dr. [name] of worsening of cellulitis of left lower extremity.  He was sent to the ED ED for IV antibiotics. Patient is a poor historian, denies any pain in the lower extremity.  He has diabetic neuropathy.  Has been feeling and having fever.  Denies abdominal pain, no nausea no vomiting.  No dysuria urgency frequency documentation.  Denies chest pain or shortness of breath.  Denies any pain in the legs or extremities he has diabetic neuropathy. In the ED, he was found to be febrile with temperature 103.1, tachycardic with heart rate 126, lactic acid 1.3.  Patient was started on IV Zosyn.  Hospital Course / Discharge diagnoses: Principal Problem  Sepsis due to left foot wound infection -Recently seen by podiatry as an outpatient on 3/19, continue broad-spectrum antibiotics, fever curve is improving, blood cultures negative. Prior microbiology reviewed, the left foot culture in February showed Enterobacter Cloacae, MSSA, Enterococcus faecalis, Bacteroides. His cellulitis has improving significantly. He initially was supposed  to finish Zosyn on 3/20, however he was at home for a week without any antibiotics and his cellulitis got worse, he is improving now and will extend treatment duration for 2 additional weeks and will continue Zosyn until 4/3.  Lower extremity Doppler negative for DVT.  Physical therapy did recommend SNF however patient declines and wants to go home.  I am concerned about that and I have expressed that to the patient given his nonadherence to medical recommendation and nonweightbearing but he is insistent that he wants to go home.  Active Problems Type 2 diabetes mellitus -continue home medications History of alcohol abuse -Last alcoholic drink was 2 days prior to admission, no withdrawal symptoms. Determined to quit Diabetic neuropathy -Continue home medications History of COPD -No wheezing, no exacerbation, respiratory status is stable, continue albuterol as needed Hyperlipidemia -continue atorvastatin  Discharge Instructions  Discharge Instructions    Home infusion instructions   Complete by: As directed    Instructions: Flushing of vascular access device: 0.9% NaCl pre/post medication administration and prn patency; Heparin 100 u/ml, 65m for implanted ports and Heparin 10u/ml, 514mfor all other central venous catheters.     Allergies as of 01/21/2020   No Known Allergies     Medication List    TAKE these medications   acetaminophen 325 MG tablet Commonly known as: TYLENOL Take 2 tablets (650 mg total) by mouth every 6 (six) hours as needed for mild pain (or Fever >/= 101).   albuterol 108 (90 Base) MCG/ACT inhaler Commonly known as: VENTOLIN HFA Inhale 2 puffs into the lungs every 6 (six) hours as needed for wheezing or shortness of breath.  ALPRAZolam 1 MG tablet Commonly known as: XANAX Take 0.5 tablets (0.5 mg total) by mouth 4 (four) times daily as needed for anxiety.   amitriptyline 100 MG tablet Commonly known as: ELAVIL Take 100 mg by mouth at bedtime.   atorvastatin  40 MG tablet Commonly known as: LIPITOR Take 40 mg by mouth every evening.   docusate sodium 100 MG capsule Commonly known as: COLACE Take 100 mg by mouth daily as needed for mild constipation.   DULoxetine 60 MG capsule Commonly known as: CYMBALTA Take 60 mg by mouth daily.   ferrous sulfate 325 (65 FE) MG tablet Take 1 tablet (325 mg total) by mouth daily.   gabapentin 300 MG capsule Commonly known as: NEURONTIN Take 9727m in the morning and at noon, then take 12093mat bedtime What changed:   how much to take  how to take this  when to take this  additional instructions   glimepiride 4 MG tablet Commonly known as: AMARYL Take 4 mg by mouth daily.   glycopyrrolate 1 MG tablet Commonly known as: ROBINUL Take 1 mg by mouth 2 (two) times daily.   hydrOXYzine 25 MG tablet Commonly known as: ATARAX/VISTARIL Take 25 mg by mouth every 4 (four) hours as needed for anxiety.   ibuprofen 400 MG tablet Commonly known as: ADVIL Take 1 tablet (400 mg total) by mouth every 8 (eight) hours as needed for moderate pain.   insulin aspart 100 UNIT/ML injection Commonly known as: novoLOG 0-9 Units, Subcutaneous, 3 times daily with meals CBG < 70: Implement Hypoglycemia measures, call MD CBG 70 - 120: 0 units CBG 121 - 150: 1 unit CBG 151 - 200: 2 units CBG 201 - 250: 3 units CBG 251 - 300: 5 units CBG 301 - 350: 7 units CBG 351 - 400: 9 units CBG > 400: call MD   lisinopril 20 MG tablet Commonly known as: ZESTRIL Take 20 mg by mouth daily.   omeprazole 20 MG capsule Commonly known as: PRILOSEC TAKE 1 CAPSULE WITH BREAKFAST AND SUPPER. What changed: See the new instructions.   Oxycodone HCl 10 MG Tabs Take 1 tablet (10 mg total) by mouth every 6 (six) hours as needed (severe pain).   piperacillin-tazobactam  IVPB Commonly known as: ZOSYN Inject 13.5 g into the vein daily for 12 days. As a continuous infusion. Indication:  Left foot osteo Last Day of Therapy:   02/01/20 Labs - Once weekly:  CBC/D and BMP, Labs - Every other week:  ESR and CRP What changed:   how much to take  when to take this  additional instructions   primidone 50 MG tablet Commonly known as: MYSOLINE Take 1 tablet (50 mg total) by mouth 2 (two) times daily.   Voltaren 1 % Gel Generic drug: diclofenac Sodium Apply 1 application topically daily as needed (pain).            Home Infusion Instuctions  (From admission, onward)         Start     Ordered   01/20/20 0000  Home infusion instructions    Question:  Instructions  Answer:  Flushing of vascular access device: 0.9% NaCl pre/post medication administration and prn patency; Heparin 100 u/ml, 27m30mor implanted ports and Heparin 10u/ml, 27ml84mr all other central venous catheters.   01/20/20 [place], [place])         Start  Ordered   01/20/20 1200  For home use only DME 3 n 1  Once    Comments: Wide size needed d/t body habitus   01/20/20 1200           Consultations:  None   Procedures/Studies:  DG Foot 2 Views Left  Result Date: 01/04/2020 CLINICAL DATA:  Left foot pain and swelling with clinical evidence of infection. Status post amputation on 12/21/2019. EXAM: LEFT FOOT - 2 VIEW COMPARISON:  12/21/2019 FINDINGS: Again demonstrated surgical absence of the mid and distal foot with anterior skin clips. More confluent in better defined soft tissue density anterior, superior and inferior to the amputation site. No soft tissue gas, bone destruction or periosteal reaction seen. Progressive diffuse soft tissue swelling. IMPRESSION: Increased soft tissue density anterior, superior and inferior to the amputation site with no radiographic evidence of osteomyelitis. The more confluent, increased soft tissue density could represent a postoperative hematoma or abscess with a non gas-forming organism. Electronically Signed   By: Claudie Revering M.D.   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 EXAM: LEFT FOOT - COMPLETE 3+ VIEW COMPARISON:  Plain films left foot 01/04/2020 and 12/17/2019. FINDINGS: Surgical staples remain in place. Amputation at the level of the talus and calcaneus again seen. Soft tissues at the amputation site appear swollen. No bony destructive change is identified. No soft tissue gas is identified. No tibiotalar joint effusion. IMPRESSION: Status post amputation as described above. Soft tissues of the amputation appear swollen. The exam is otherwise negative. Electronically Signed   By: Inge Rise M.D.   On: 01/17/2020 15:39   VAS [place] LOWER EXTREMITY VENOUS (DVT)  Result Date: 01/20/2020  Lower Venous DVTStudy Indications: Swelling.  Risk Factors: None identified. Limitations: Poor ultrasound/tissue interface. Comparison Study: No prior studies. Performing Technologist: Oliver Hum RVT  Examination Guidelines: A complete evaluation includes B-mode imaging, spectral Doppler, color Doppler, and power Doppler as needed of all accessible portions of each vessel. Bilateral testing is considered an integral part of a complete examination. Limited examinations for reoccurring indications may be performed as noted. The reflux portion of the exam is performed with the patient in reverse Trendelenburg.  +-----+---------------+---------+-----------+----------+--------------+ RIGHTCompressibilityPhasicitySpontaneityPropertiesThrombus Aging +-----+---------------+---------+-----------+----------+--------------+ CFV  Full           Yes      Yes                                 +-----+---------------+---------+-----------+----------+--------------+   +---------+---------------+---------+-----------+----------+--------------+ LEFT      CompressibilityPhasicitySpontaneityPropertiesThrombus Aging +---------+---------------+---------+-----------+----------+--------------+ CFV      Full           Yes      Yes                                 +---------+---------------+---------+-----------+----------+--------------+ SFJ      Full                                                        +---------+---------------+---------+-----------+----------+--------------+ FV Prox  Full                                                        +---------+---------------+---------+-----------+----------+--------------+  FV Mid   Full                                                        +---------+---------------+---------+-----------+----------+--------------+ FV DistalFull                                                        +---------+---------------+---------+-----------+----------+--------------+ PFV      Full                                                        +---------+---------------+---------+-----------+----------+--------------+ POP      Full           Yes      Yes                                 +---------+---------------+---------+-----------+----------+--------------+ PTV      Full                                                        +---------+---------------+---------+-----------+----------+--------------+ PERO     Full                                                        +---------+---------------+---------+-----------+----------+--------------+     Summary: RIGHT: - No evidence of common femoral vein obstruction.  LEFT: - There is no evidence of deep vein thrombosis in the lower extremity.  - No cystic structure found in the popliteal fossa. - Ultrasound characteristics of enlarged lymph nodes noted in the groin.  *See table(s) above for measurements and observations. Electronically signed by Curt Jews MD on 01/20/2020 at 3:39:33 PM.    Final    [place] EKG SITE RITE  Result  Date: 12/26/2019 If [facility] image not attached, placement could not be confirmed due to current cardiac rhythm.     Subjective: - no chest pain, shortness of breath, no abdominal pain, nausea or vomiting.   Discharge Exam: BP 131/63 (BP Location: Left Arm)   Pulse 93   Temp 97.6 F (36.4 C) (Axillary)   Resp 19   Ht 6' 2" (1.88 m)   Wt 129.6 kg   SpO2 97%   BMI 36.68 kg/m   General: Pt is alert, awake, not in acute distress Cardiovascular: RRR, S1/S2 +, no rubs, no gallops Respiratory: CTA bilaterally, no wheezing, no rhonchi Abdominal: Soft, NT, ND, bowel sounds + Extremities: no edema, no cyanosis    The results of significant diagnostics from this hospitalization (including imaging, microbiology, ancillary and laboratory) are listed below for reference.     Microbiology: Recent Results (from the past  240 hour(s))  Urine culture     Status: Abnormal   Collection Time: 01/17/20  2:49 AM   Specimen: In/Out Cath Urine  Result Value Ref Range Status   Specimen Description   Final    IN/OUT CATH URINE Performed at [facility], [place] [address], [place], [place] [address]    Special Requests added [phone] 01/19/2020  Final   Culture MULTIPLE SPECIES PRESENT, SUGGEST RECOLLECTION (A)  Final   Report Status 01/20/2020 FINAL  Final  Blood culture (routine x 2)     Status: None (Preliminary result)   Collection Time: 01/17/20  3:15 PM   Specimen: Site Not Specified; Blood  Result Value Ref Range Status   Specimen Description SITE NOT SPECIFIED  Final   Special Requests   Final    BOTTLES DRAWN AEROBIC AND ANAEROBIC Blood Culture adequate volume Performed at [facility], [place] [address], [place], [place] [address]    Culture NO GROWTH 4 DAYS  Final   Report Status PENDING  Incomplete  Blood culture (routine x 2)     Status: Abnormal   Collection Time: 01/17/20  3:22 PM   Specimen: BLOOD  Result Value Ref Range Status   Specimen Description BLOOD SITE NOT  SPECIFIED  Final   Special Requests   Final    BOTTLES DRAWN AEROBIC AND ANAEROBIC Blood Culture adequate volume   Culture  Setup Time   Final    ANAEROBIC BOTTLE ONLY GRAM POSITIVE RODS CRITICAL RESULT CALLED TO, READ BACK BY AND VERIFIED WITH: K AMEND PHARMD 01/18/20 0550 JDW    Culture (A)  Final    BACILLUS SPECIES Standardized susceptibility testing for this organism is not available. Performed at [facility], [place] [address], [place], [place] [address]    Report Status 01/20/2020 FINAL  Final  SARS CORONAVIRUS 2 (TAT 6-24 HRS) Nasopharyngeal Nasopharyngeal Swab     Status: None   Collection Time: 01/17/20  4:54 PM   Specimen: Nasopharyngeal Swab  Result Value Ref Range Status   SARS Coronavirus 2 NEGATIVE NEGATIVE Final    Comment: (NOTE) SARS-CoV-2 target nucleic acids are NOT DETECTED. The SARS-CoV-2 RNA is generally detectable in upper and lower respiratory specimens during the acute phase of infection. Negative results do not preclude SARS-CoV-2 infection, do not rule out co-infections with other pathogens, and should not be used as the sole basis for treatment or other patient management decisions. Negative results must be combined with clinical observations, patient history, and epidemiological information. The expected result is Negative. Fact Sheet for Patients: SugarRoll.be Fact Sheet for Healthcare Providers: https://www.woods-mathews.com/ This test is not yet approved or cleared by the [place] FDA and  has been authorized for detection and/or diagnosis of SARS-CoV-2 by FDA under an Emergency Use Authorization (EUA). This EUA will remain  in effect (meaning this test can be used) for the duration of the COVID-19 declaration under Section 56 4(b)(1) of the Act, 21 U.S.C. section 360bbb-3(b)(1), unless the authorization is terminated or revoked sooner. Performed at [facility], [place] [address],  [place], [place] [address]   MRSA PCR Screening     Status: None   Collection Time: 01/17/20  9:35 PM   Specimen: Nasopharyngeal  Result Value Ref Range Status   MRSA by PCR NEGATIVE NEGATIVE Final    Comment:        The GeneXpert MRSA Assay (FDA approved for NASAL specimens only), is one component of a comprehensive MRSA colonization  surveillance program. It is not intended to diagnose MRSA infection nor to guide or monitor treatment for MRSA infections. Performed at [facility], [place] [address] [place], [place] [address]      Labs: Basic Metabolic Panel: Recent Labs  Lab 01/17/20 1518 01/17/20 2330 01/18/20 1134 01/19/20 0329  NA 135  --  136 135  K 4.5  --  4.0 4.0  CL 99  --  105 103  CO2 24  --  23 24  GLUCOSE 194*  --  161* 191*  BUN 16  --  13 15  CREATININE 1.26*  --  0.88 0.97  CALCIUM 9.2  --  8.3* 8.7*  MG  --  1.7  --   --   PHOS  --  3.9  --   --    Liver Function Tests: Recent Labs  Lab 01/17/20 1518 01/18/20 1134 01/19/20 0329  AST 14* 12* 13*  ALT _0 ALKPHOS 62 46 48  BILITOT 1.1 0.8 0.4  PROT 6.9 5.6* 5.9*  ALBUMIN 3.3* 2.4* 2.4*   CBC: Recent Labs  Lab 01/17/20 1518 01/18/20 1134 01/19/20 0329  WBC 6.5 3.4* 4.0  NEUTROABS 5.2  --   --   HGB 9.7* 7.7* 7.7*  HCT 30.9* 24.3* 24.1*  MCV 94.8 91.4 92.3  PLT 175 126* 152   CBG: Recent Labs  Lab 01/20/20 0840 01/20/20 1229 01/20/20 1747 01/20/20 2107 01/21/20 0746  GLUCAP 234* 226* 261* 216* 148*   Hgb A1c No results for input(s): HGBA1C in the last 72 hours. Lipid Profile No results for input(s): CHOL, HDL, LDLCALC, TRIG, CHOLHDL, LDLDIRECT in the last 72 hours. Thyroid function studies No results for input(s): TSH, T4TOTAL, T3FREE, THYROIDAB in the last 72 hours.  Invalid input(s): FREET3 Urinalysis    Component Value Date/Time   COLORURINE YELLOW 01/18/2020 [place] 01/18/2020 0434   LABSPEC 1.015 01/18/2020 0434   PHURINE 5.0 01/18/2020  0434   GLUCOSEU NEGATIVE 01/18/2020 0434   HGBUR LARGE (A) 01/18/2020 0434   BILIRUBINUR NEGATIVE 01/18/2020 0434   KETONESUR NEGATIVE 01/18/2020 0434   PROTEINUR 30 (A) 01/18/2020 0434   UROBILINOGEN 4.0 (H) 10/09/2008 1114   NITRITE NEGATIVE 01/18/2020 0434   LEUKOCYTESUR NEGATIVE 01/18/2020 0434    FURTHER DISCHARGE INSTRUCTIONS:   Get Medicines reviewed and adjusted: Please take all your medications with you for your next visit with your Primary MD   Laboratory/radiological data: Please request your Primary MD to go over all hospital tests and procedure/radiological results at the follow up, please ask your Primary MD to get all Hospital records sent to his/her office.   In some cases, they will be blood work, cultures and biopsy results pending at the time of your discharge. Please request that your primary care M.D. goes through all the records of your hospital data and follows up on these results.   Also Note the following: If you experience worsening of your admission symptoms, develop shortness of breath, life threatening emergency, suicidal or homicidal thoughts you must seek medical attention immediately by calling 911 or calling your MD immediately  if symptoms less severe.   You must read complete instructions/literature along with all the possible adverse reactions/side effects for all the Medicines you take and that have been prescribed to you. Take any new Medicines after you have completely understood and accpet all the possible adverse reactions/side effects.    Do not drive when taking Pain medications or sleeping medications (  Benzodaizepines)   Do not take more than prescribed Pain, Sleep and Anxiety Medications. It is not advisable to combine anxiety,sleep and pain medications without talking with your primary care practitioner   Special Instructions: If you have smoked or chewed Tobacco  in the last 2 yrs please stop smoking, stop any regular Alcohol  and or any  Recreational drug use.   Wear Seat belts while driving.   Please note: You were cared for by a hospitalist during your hospital stay. Once you are discharged, your primary care physician will handle any further medical issues. Please note that NO REFILLS for any discharge medications will be authorized once you are discharged, as it is imperative that you return to your primary care physician (or establish a relationship with a primary care physician if you do not have one) for your post hospital discharge needs so that they can reassess your need for medications and monitor your lab values.  Time coordinating discharge: 40 minutes  SIGNED:  Marzetta Board, MD, PhD 01/21/2020, 10:21 AM

## 2020-01-21 NOTE — TOC Transition Note (Signed)
Transition of Care [facility]) - CM/SW Discharge Note   Patient Details  Name: Eugene Diaz MRN: [id number] Date of Birth: 1966-07-12  Transition of Care [facility]) CM/SW Contact:  Pollie Friar, RN Phone Number: 01/21/2020, 1:17 PM   Clinical Narrative:    Pt discharging home with [facility] services. [facility] will provide the RN for IV and wound care. [facility] will provide the PT.  Pam with [employer] to come at 3 pm to start the abx infusion and go over teaching again with the family.  Bedside Rn to educate family on dressing change to his foot.  Family to provide transport home.   Final next level of care: Ledbetter Barriers to Discharge: No Barriers Identified   Patient Goals and CMS Choice Patient states their goals for this hospitalization and ongoing recovery are:: To return home CMS Medicare.gov Compare Post Acute Care list provided to:: Patient Choice offered to / list presented to : Patient  Discharge Placement                       Discharge Plan and Services In-house Referral: Clinical Social Work Discharge Planning Services: CM Consult            DME Arranged: 3-N-1 DME Agency: [facility]       HH Arranged: PT, RN [place] Agency: [place] [facility])([facility] for PT and [facility] for IV/RN) Date HH Agency Contacted: 01/21/20   Representative spoke with at [place]: Butch Penny  Social Determinants of Health ([place]) Interventions     Readmission Risk Interventions No flowsheet data found.

## 2020-01-21 NOTE — Telephone Encounter (Signed)
Thanks to all for covering in my absence.

## 2020-01-22 LAB — CULTURE, BLOOD (ROUTINE X 2)
Culture: NO GROWTH
Special Requests: ADEQUATE

## 2020-01-23 ENCOUNTER — Emergency Department (HOSPITAL_COMMUNITY)
Admission: EM | Admit: 2020-01-23 | Discharge: 2020-01-23 | Disposition: A | Discharge status: Home / Self Care | Payer: Medicaid Other | Attending: Emergency Medicine | Admitting: Emergency Medicine

## 2020-01-23 ENCOUNTER — Other Ambulatory Visit: Payer: Self-pay

## 2020-01-23 ENCOUNTER — Encounter (HOSPITAL_COMMUNITY): Payer: Self-pay | Admitting: *Deleted

## 2020-01-23 ENCOUNTER — Emergency Department: Payer: Self-pay

## 2020-01-23 DIAGNOSIS — L97529 Non-pressure chronic ulcer of other part of left foot with unspecified severity: Secondary | ICD-10-CM | POA: Diagnosis not present

## 2020-01-23 DIAGNOSIS — I10 Essential (primary) hypertension: Secondary | ICD-10-CM | POA: Diagnosis not present

## 2020-01-23 DIAGNOSIS — E11621 Type 2 diabetes mellitus with foot ulcer: Secondary | ICD-10-CM | POA: Insufficient documentation

## 2020-01-23 DIAGNOSIS — T82594A Other mechanical complication of infusion catheter, initial encounter: Secondary | ICD-10-CM | POA: Insufficient documentation

## 2020-01-23 DIAGNOSIS — Z794 Long term (current) use of insulin: Secondary | ICD-10-CM | POA: Diagnosis not present

## 2020-01-23 DIAGNOSIS — Z452 Encounter for adjustment and management of vascular access device: Secondary | ICD-10-CM

## 2020-01-23 DIAGNOSIS — Z79899 Other long term (current) drug therapy: Secondary | ICD-10-CM | POA: Diagnosis not present

## 2020-01-23 DIAGNOSIS — Y69 Unspecified misadventure during surgical and medical care: Secondary | ICD-10-CM | POA: Insufficient documentation

## 2020-01-23 MED ORDER — SODIUM CHLORIDE 0.9% FLUSH: 10.0000 mL | INTRAVENOUS | Status: DC | PRN

## 2020-01-23 MED ORDER — SODIUM CHLORIDE 0.9% FLUSH: 10.0000 mL | Freq: Two times a day (BID) | INTRAVENOUS | Status: DC

## 2020-01-23 MED ORDER — CHLORHEXIDINE GLUCONATE CLOTH 2 % EX PADS: 6.0000 | MEDICATED_PAD | Freq: Every day | CUTANEOUS | Status: DC

## 2020-01-23 NOTE — Progress Notes (Signed)
Peripherally Inserted Central Catheter Placement  The IV Nurse has discussed with the patient and/or persons authorized to consent for the patient, the purpose of this procedure and the potential benefits and risks involved with this procedure.  The benefits include less needle sticks, lab draws from the catheter, and the patient may be discharged home with the catheter. Risks include, but not limited to, infection, bleeding, blood clot (thrombus formation), and puncture of an artery; nerve damage and irregular heartbeat and possibility to perform a PICC exchange if needed/ordered by physician.  Alternatives to this procedure were also discussed.  Bard Power PICC patient education guide, fact sheet on infection prevention and patient information card has been provided to patient /or left at bedside.    PICC Placement Documentation  PICC Single Lumen [id number] PICC Left Basilic 51 cm 0 cm (Active)  Indication for Insertion or Continuance of Line Home intravenous therapies (PICC only) 01/23/20 1753  Exposed Catheter (cm) 0 cm 01/23/20 1753  Site Assessment Clean;Dry;Intact 01/23/20 1753  Line Status Flushed;Saline locked;Blood return noted 01/23/20 1753  Dressing Type Transparent 01/23/20 1753  Dressing Status Clean;Dry;Intact;Antimicrobial disc in place 01/23/20 1753  Dressing Change Due 01/30/20 01/23/20 1753       Gordan Payment 01/23/2020, 5:54 PM

## 2020-01-23 NOTE — Discharge Instructions (Addendum)
Continue current medications. 

## 2020-01-23 NOTE — Progress Notes (Signed)
Contacted by ED Charge RN. Pt presents to ED with complaint that his PICC line had been pulled out today. Pt on home IV antibiotic therapy with HH following. Charge RN asking if PICC can be placed today since patient presented to ED. Contacted IV team partners at [facility]. There is a possibility to place new PICC if order received later this evening, most likely after 1700. ED Charge RN aware and will follow up with ED provider and patient about best plan of care.

## 2020-01-23 NOTE — ED Provider Notes (Signed)
[facility] EMERGENCY DEPARTMENT Provider Note   CSN: [id number] Arrival date & time: 01/23/20  1220     History Chief Complaint  Patient presents with  . PICC line pulled out    Eugene Diaz is a 54 y.o. male.  Pt reports he is here to get a picc line.  Pt reports accidentally pulling his out at home.  Pt receiving Iv antibiotics   The history is provided by the patient. No language interpreter was used.       Past Medical History:  Diagnosis Date  . Alcohol abuse   . Alcoholic peripheral neuropathy ([place])   . Anemia   . Anxiety   . Arthritis   . Asthma    followed by pcp  . B12 deficiency   . Cellulitis of left lower leg    07-2019 and left foot  . Depression   . Diabetic neuropathy ([place])   . Essential tremor    neurologist-- dr patel--- due to alcohol abuse  . GERD (gastroesophageal reflux disease)   . History of esophageal stricture    s/p  dilatation 02-13-2018  . Hypercholesterolemia   . Hypertension    followed by pcp  (nuclear stress test 03-11-2014 low risk w/ no ischemia, ef 65%  . Osteomyelitis of great toe ([place])    left foot s/p amputation 08-14-2019  . Peripheral vascular disease ([place])   . Thrombocytopenia (HCC)    chronic  . Type 2 diabetes mellitus ([place])    followed by pcp--- last A1c in epic 6.6 on 07-12-2019  . Wound dehiscence, surgical    post left great toe amputation 08-14-2019    Patient Active Problem List   Diagnosis Date Noted  . Acute blood loss anemia 01/04/2020  . Asthma   . Depression with anxiety   . Osteomyelitis of ankle or foot, acute, left ([place]) 12/17/2019  . Hyperglycemia 12/17/2019  . Type 2 diabetes mellitus with foot ulcer ([place])   . Anemia, chronic disease 11/10/2019  . Acute osteomyelitis of left foot ([place]) 11/10/2019  . Marijuana use 11/10/2019  . Wound dehiscence 08/31/2019  . Osteomyelitis due to type 2 diabetes mellitus ([place]) 08/14/2019  . Acute osteomyelitis of toe of left foot ([place]) 08/14/2019  .  Sepsis ([place]) 08/14/2019  . Hyponatremia 08/14/2019  . Normocytic anemia 08/14/2019  . B12 deficiency 08/14/2019  . Osteomyelitis of foot ([place])   . Cellulitis of toe, left   . Diabetic infection of left foot ([place])   . Toe fracture, left 07/13/2019  . Cellulitis 07/12/2019  . GERD (gastroesophageal reflux disease) 12/19/2018  . Hepatic steatosis 06/18/2018  . Gastritis, erosive   . Dysphagia, idiopathic 12/27/2017  . Rectal bleeding 12/27/2017  . Intention tremor 06/03/2016  . Left knee pain 12/04/2015  . Alcoholic peripheral neuropathy ([place]) 10/08/2014  . Diabetic neuropathy ([place]) 10/08/2014  . Chest pain, rule out acute myocardial infarction 03/10/2014  . Thrombocytopenia ([place]) 03/10/2014  . ETOH abuse 03/10/2014  . Chest pain 03/10/2014  . Diabetes mellitus without complication ([place])   . Hypertension associated with type 2 diabetes mellitus ([place])   . Hyperlipidemia associated with type 2 diabetes mellitus [facility])     Past Surgical History:  Procedure Laterality Date  . AMPUTATION Left 10/16/2019   Procedure: Left partial second ray resection; placement of antibiotic beads;  Surgeon: Evelina Bucy, DPM;  [facility]: [facility];  Service: Podiatry;  Laterality: Left;  . AMPUTATION Left 11/13/2019   Procedure: Left Midfoot Amputation - Transmetatarsal vs. Lisfranc;  Placement antibiotic beads;  Surgeon: Evelina Bucy, DPM;  [facility];  Service: Podiatry;  Laterality: Left;  . AMPUTATION Left 12/21/2019   Procedure: [place] Amputation left foot;  Surgeon: Evelina Bucy, DPM;  Location: [place];  Service: Podiatry;  Laterality: Left;  . AMPUTATION TOE Left 08/14/2019   Procedure: AMPUTATION GREAT TOE;  Surgeon: Evelina Bucy, DPM;  [facility];  Service: Podiatry;  Laterality: Left;  . APPLICATION OF WOUND VAC Left 12/21/2019   Procedure: Application Of Wound Vac;  Surgeon: Evelina Bucy, DPM;  Location: [place];  Service: Podiatry;  Laterality: Left;  . BIOPSY   02/13/2018   Procedure: BIOPSY;  Surgeon: Danie Binder, MD;  [facility];  Service: Endoscopy;;  transverse colon biopsy, gastric biopsy  . COLONOSCOPY WITH PROPOFOL N/A 02/13/2018   Procedure: COLONOSCOPY WITH PROPOFOL;  Surgeon: Danie Binder, MD;  [facility];  Service: Endoscopy;  Laterality: N/A;  11:15am  . ESOPHAGOGASTRODUODENOSCOPY (EGD) WITH PROPOFOL N/A 02/13/2018   Procedure: ESOPHAGOGASTRODUODENOSCOPY (EGD) WITH PROPOFOL;  Surgeon: Danie Binder, MD;  [facility];  Service: Endoscopy;  Laterality: N/A;  . I & D EXTREMITY Left 07/16/2019   Procedure: Insicion  AND DEBRIDEMENT EXTREMITY;  Surgeon: Evelina Bucy, DPM;  Location: [place];  Service: Podiatry;  Laterality: Left;  . INCISION AND DRAINAGE OF WOUND Left 10/13/2019   Procedure: IRRIGATION AND DEBRIDEMENT WOUND OF LEFT FOOT AND FIRST METATARSAL RESECTION;  Surgeon: Evelina Bucy, DPM;  [facility];  Service: Podiatry;  Laterality: Left;  . IRRIGATION AND DEBRIDEMENT FOOT Left 11/11/2019   Procedure: Left Foot Wound Irrigation and Debridement;  Surgeon: Evelina Bucy, DPM;  [facility];  Service: Podiatry;  Laterality: Left;  . Left arm     Left arm repair (tendon and artery)  . PERCUTANEOUS PINNING  07/16/2019   Procedure: Open Reduction Percutaneous Pinning Extremity;  Surgeon: Evelina Bucy, DPM;  Location: [place];  Service: Podiatry;;  . PILONIDAL CYST EXCISION N/A 08/08/2014   Procedure: CYST EXCISION PILONIDAL EXTENSIVE;  Surgeon: Jamesetta So, MD;  [facility];  Service: General;  Laterality: N/A;  . POLYPECTOMY  02/13/2018   Procedure: POLYPECTOMY;  Surgeon: Danie Binder, MD;  [facility];  Service: Endoscopy;;  transverse colon polyp hs, rectal polyps times 2  . SAVORY DILATION N/A 02/13/2018   Procedure: SAVORY DILATION;  Surgeon: Danie Binder, MD;  [facility];  Service: Endoscopy;  Laterality: N/A;  . WOUND DEBRIDEMENT Left  09/04/2019   Procedure: DEBRIDEMENT WOUND WITH COMPLEX  REPAIR OF DEHISCENCE;  Surgeon: Evelina Bucy, DPM;  Location: [place];  Service: Podiatry;  Laterality: Left;  Leave patient on stretcher  . WOUND DEBRIDEMENT Left 10/16/2019   Procedure: Left foot wound debridement and closure;  Surgeon: Evelina Bucy, DPM;  [facility];  Service: Podiatry;  Laterality: Left;  . WOUND DEBRIDEMENT Left 12/18/2019   Procedure: LEFT FOOT DEBRIDEMENT WITH PARTIAL INCISION OF INFECTED BONE;  Surgeon: Evelina Bucy, DPM;  Location: [place];  Service: Podiatry;  Laterality: Left;  LEFT FOOT DEBRIDEMENT WITH PARTIAL INCISION OF INFECTED BONE       Family History  Problem Relation Age of Onset  . Heart attack Mother        Living, 38  . Cancer Father        Deceased  . Healthy Brother   . Healthy Sister   .  Colon cancer Neg Hx   . Colon polyps Neg Hx     Social History   Tobacco Use  . Smoking status: Former Smoker    Packs/day: 2.50    Years: 25.00    Pack years: 62.50    Types: Cigarettes    Quit date: 08/05/1984    Years since quitting: 35.4  . Smokeless tobacco: Never Used  Substance Use Topics  . Alcohol use: Yes    Alcohol/week: 50.0 standard drinks    Types: 50 Standard drinks or equivalent per week    Comment: Drinks 6 beers, liquor 4-6oz (both daily) x 35 years  . Drug use: Yes    Types: Marijuana    Comment: 09-03-2019  per pt states very seldom    Home Medications Prior to Admission medications   Medication Sig Start Date End Date Taking? Authorizing Provider  acetaminophen (TYLENOL) 325 MG tablet Take 2 tablets (650 mg total) by mouth every 6 (six) hours as needed for mild pain (or Fever >/= 101). Patient not taking: Reported on 01/19/2020 12/27/19   Arrien, [name], MD  albuterol (PROVENTIL HFA;VENTOLIN HFA) 108 (90 Base) MCG/ACT inhaler Inhale 2 puffs into the lungs every 6 (six) hours as needed for wheezing or shortness of breath. 10/31/18    Hayden Rasmussen, MD  ALPRAZolam Duanne Moron) 1 MG tablet Take 0.5 tablets (0.5 mg total) by mouth 4 (four) times daily as needed for anxiety. 01/21/20   Caren Griffins, MD  amitriptyline (ELAVIL) 100 MG tablet Take 100 mg by mouth at bedtime.    [provider]  atorvastatin (LIPITOR) 40 MG tablet Take 40 mg by mouth every evening.  12/17/15   [provider]  diclofenac Sodium (VOLTAREN) 1 % GEL Apply 1 application topically daily as needed (pain).    [provider]  docusate sodium (COLACE) 100 MG capsule Take 100 mg by mouth daily as needed for mild constipation.    [provider]  DULoxetine (CYMBALTA) 60 MG capsule Take 60 mg by mouth daily. 11/18/19   [provider]  ferrous sulfate 325 (65 FE) MG tablet Take 1 tablet (325 mg total) by mouth daily. Patient not taking: Reported on 01/19/2020 11/15/19 11/14/20  Shelly Coss, MD  gabapentin (NEURONTIN) 300 MG capsule Take 944m in the morning and at noon, then take 12060mat bedtime Patient taking differently: Take 900-1,200 mg by mouth See admin instructions. Take 3 capsules (900 mg) by mouth every morning and at noon, take 4 capsules (1200 mg) at bedtime 06/14/19   PaNarda Amber, DO  glimepiride (AMARYL) 4 MG tablet Take 4 mg by mouth daily. 08/10/19   [provider]  glycopyrrolate (ROBINUL) 1 MG tablet Take 1 mg by mouth 2 (two) times daily.  12/17/15   [provider]  hydrOXYzine (ATARAX/VISTARIL) 25 MG tablet Take 25 mg by mouth every 4 (four) hours as needed for anxiety.  06/17/19   [provider]  ibuprofen (ADVIL) 400 MG tablet Take 1 tablet (400 mg total) by mouth every 8 (eight) hours as needed for moderate pain. 12/27/19   Arrien, MaJimmy PicketMD  insulin aspart (NOVOLOG) 100 UNIT/ML injection 0-9 Units, Subcutaneous, 3 times daily with meals CBG < 70: Implement Hypoglycemia measures, call MD CBG 70 - 120: 0 units CBG 121 - 150: 1 unit CBG 151 - 200: 2  units CBG 201 - 250: 3 units CBG 251 - 300: 5 units CBG 301 - 350: 7 units CBG 351 -  400: 9 units CBG > 400: call MD Patient not taking: Reported on 01/19/2020 01/07/20   Jonetta Osgood, MD  lisinopril (PRINIVIL,ZESTRIL) 20 MG tablet Take 20 mg by mouth daily.  12/30/15   [provider]  omeprazole (PRILOSEC) 20 MG capsule TAKE 1 CAPSULE WITH BREAKFAST AND SUPPER. Patient taking differently: Take 20 mg by mouth 2 (two) times daily before a meal.  11/19/19   Mahala Menghini, PA-C  Oxycodone HCl 10 MG TABS Take 1 tablet (10 mg total) by mouth every 6 (six) hours as needed (severe pain). 01/21/20   Caren Griffins, MD  piperacillin-tazobactam (ZOSYN) IVPB Inject 13.5 g into the vein daily for 12 days. As a continuous infusion. Indication:  Left foot osteo Last Day of Therapy:  02/01/20 Labs - Once weekly:  CBC/D and BMP, Labs - Every other week:  ESR and CRP 01/20/20 02/01/20  Caren Griffins, MD  primidone (MYSOLINE) 50 MG tablet Take 1 tablet (50 mg total) by mouth 2 (two) times daily. Patient taking differently: Take 50 mg by mouth 2 (two) times daily.  06/14/19   Narda Amber K, DO    Allergies    Patient has no known allergies.  Review of Systems   Review of Systems  All other systems reviewed and are negative.   Physical Exam Updated Vital Signs BP (!) 141/64 (BP Location: Right Arm)   Pulse 77   Temp 99.1 F (37.3 C) (Oral)   Resp 14   Ht 6' 2"  (1.88 m)   Wt 131.5 kg   SpO2 100%   BMI 37.23 kg/m   Physical Exam HENT:     Head: Normocephalic.  Cardiovascular:     Rate and Rhythm: Normal rate.  Pulmonary:     Effort: Pulmonary effort is normal.  Musculoskeletal:        General: Normal range of motion.     Comments: Right arm  No sign of infection, scabbed insertion site   Skin:    General: Skin is warm.  Neurological:     General: No focal deficit present.     Mental Status: He is alert.  Psychiatric:        Mood and Affect: Mood normal.     ED  Results / Procedures / Treatments   Labs (all labs ordered are listed, but only abnormal results are displayed) Labs Reviewed - No data to display  EKG None  Radiology [place] EKG SITE RITE  Result Date: 01/23/2020 If Site Rite image not attached, placement could not be confirmed due to current cardiac rhythm.   Procedures Procedures (including critical care time)  Medications Ordered in ED Medications - No data to display  ED Course  I have reviewed the triage vital signs and the nursing notes.  Pertinent labs & imaging results that were available during my care of the patient were reviewed by me and considered in my medical decision making (see chart for details).    MDM Rules/Calculators/A&P                      MDM: Picc line ordered.  Final Clinical Impression(s) / ED Diagnoses Final diagnoses:  Needs peripherally inserted central catheter (PICC)    Rx / DC Orders ED Discharge Orders    None    An After Visit Summary was printed and given to the patient.    Sidney Ace 01/23/20 1704    Davonna Belling, MD 01/23/20 2325

## 2020-01-23 NOTE — ED Triage Notes (Signed)
Pt states PICC line pulled this morning, on antibiotics for infection to foot from an amputation.

## 2020-01-24 ENCOUNTER — Telehealth: Payer: Self-pay | Admitting: *Deleted

## 2020-01-24 NOTE — Telephone Encounter (Signed)
Patient needs f/u appt next week.  Betadine to amputation stump  NWB to operative extremity.  Continue IV abx through PICC

## 2020-01-24 NOTE — Telephone Encounter (Signed)
Left message for [place] with [name] 01/24/2020 4:35pm orders.

## 2020-01-24 NOTE — Telephone Encounter (Signed)
[place] states she is seeing pt for the 1st time, for [facility] for left foot amputation, pt is non-compliant for weight bearing and keeping dressing clean and dry and does pt have a follow up appt.

## 2020-01-27 ENCOUNTER — Other Ambulatory Visit (HOSPITAL_COMMUNITY)
Admission: RE | Admit: 2020-01-27 | Discharge: 2020-01-27 | Disposition: A | Discharge status: Home / Self Care | Payer: Medicaid Other | Source: Other Acute Inpatient Hospital | Attending: Physician Assistant | Admitting: Physician Assistant

## 2020-01-27 DIAGNOSIS — M86172 Other acute osteomyelitis, left ankle and foot: Secondary | ICD-10-CM | POA: Diagnosis not present

## 2020-01-27 LAB — CBC WITH DIFFERENTIAL/PLATELET
Abs Immature Granulocytes: 0.03 10*3/uL (ref 0.00–0.07)
Basophils Absolute: 0 10*3/uL (ref 0.0–0.1)
Basophils Relative: 1 %
Eosinophils Absolute: 0.2 10*3/uL (ref 0.0–0.5)
Eosinophils Relative: 5 %
HCT: 27.1 % — ABNORMAL LOW (ref 39.0–52.0)
Hemoglobin: 8.4 g/dL — ABNORMAL LOW (ref 13.0–17.0)
Immature Granulocytes: 1 %
Lymphocytes Relative: 27 %
Lymphs Abs: 1.1 10*3/uL (ref 0.7–4.0)
MCH: 27.9 pg (ref 26.0–34.0)
MCHC: 31 g/dL (ref 30.0–36.0)
MCV: 90 fL (ref 80.0–100.0)
Monocytes Absolute: 0.2 10*3/uL (ref 0.1–1.0)
Monocytes Relative: 5 %
Neutro Abs: 2.5 10*3/uL (ref 1.7–7.7)
Neutrophils Relative %: 61 %
Platelets: 346 10*3/uL (ref 150–400)
RBC: 3.01 MIL/uL — ABNORMAL LOW (ref 4.22–5.81)
RDW: 14 % (ref 11.5–15.5)
WBC: 4.1 10*3/uL (ref 4.0–10.5)
nRBC: 0 % (ref 0.0–0.2)

## 2020-01-27 LAB — BASIC METABOLIC PANEL
Anion gap: 9 (ref 5–15)
BUN: 9 mg/dL (ref 6–20)
CO2: 27 mmol/L (ref 22–32)
Calcium: 9.2 mg/dL (ref 8.9–10.3)
Chloride: 105 mmol/L (ref 98–111)
Creatinine, Ser: 0.87 mg/dL (ref 0.61–1.24)
GFR calc Af Amer: >60 mL/min (ref >60)
GFR calc non Af Amer: >60 mL/min (ref >60)
Glucose, Bld: 142 mg/dL — ABNORMAL HIGH (ref 70–99)
Potassium: 4.3 mmol/L (ref 3.5–5.1)
Sodium: 141 mmol/L (ref 135–145)

## 2020-01-28 ENCOUNTER — Telehealth: Payer: Self-pay | Admitting: *Deleted

## 2020-01-28 NOTE — Telephone Encounter (Signed)
I spoke with [facility] [place] and she states Nurse - Pam instructed pt on how to perform the IV care and had been assured by [facility] that pt is performing the IV correctly. [name] states she will speak Orville Govern that performs the IV therapy and check pt's compliance with IV and dressings.

## 2020-01-28 NOTE — Telephone Encounter (Signed)
Eugene Diaz - [facility] states another [facility] instructed pt on self IV therapy, and the other [facility] has instructed on wound care, but pt is not taking care of the wound is walking on the surgery foot in filth, dressing has not been changed since 01/24/2020, pt's IV fluids are not stored in the refrigerator, and pt forces the flow of the IV by squeezing the bag forcefully. Pt states he has not been performing the betadine dressings and doesn't know what betadine is and has never seen a nurse for instructions. Eugene Diaz states their agency is looking to send a nurse out to assist with Wound care and to observe the IV therapy, but pt is non-compliant and states he doesn't care.

## 2020-01-28 NOTE — Telephone Encounter (Signed)
[place], PT they have concerns with current wound care orders, Orville Govern is his [place] .

## 2020-01-28 NOTE — Telephone Encounter (Signed)
[facility] [place] states they would like to double check pt's wound care that at this time is betadine to the wound site per [facility], they would like to reinforce teaching.

## 2020-01-28 NOTE — Telephone Encounter (Signed)
Noted. Will see patient this Thursday and address issues.

## 2020-01-29 NOTE — Telephone Encounter (Signed)
[place] states pt's PICC line orders are to be discontinued 02/01/2020 and she needs orders from Dr. Marzetta Board.

## 2020-01-29 NOTE — Telephone Encounter (Signed)
[facility] - Debbie transferred to Martie Lee informed [facility] of DR. Price's orders to continue with betadine to surgery site and to cover with a DSD. I informed [place], I had received a call from [facility] concerning Dr. Gloris Ham Gherghe's orders to discontinue PICC line on 02/01/2020. [name] states she had wanted me to contact her with the current wound care orders and she will contact Caryl Pina with the continuation of the wound care orders and to contact Dr. Cruzita Lederer for PICC line orders.

## 2020-01-29 NOTE — Telephone Encounter (Signed)
I spoke with [facility] and she states [name] will be in at 9:00am.

## 2020-01-30 ENCOUNTER — Other Ambulatory Visit: Payer: Self-pay

## 2020-01-30 ENCOUNTER — Ambulatory Visit (INDEPENDENT_AMBULATORY_CARE_PROVIDER_SITE_OTHER): Payer: Medicaid Other | Admitting: Podiatry

## 2020-01-30 VITALS — Temp 96.3°F

## 2020-01-30 DIAGNOSIS — Z9889 Other specified postprocedural states: Secondary | ICD-10-CM

## 2020-01-31 ENCOUNTER — Other Ambulatory Visit: Payer: Self-pay

## 2020-01-31 ENCOUNTER — Encounter (HOSPITAL_COMMUNITY): Payer: Self-pay | Admitting: *Deleted

## 2020-01-31 ENCOUNTER — Emergency Department (HOSPITAL_COMMUNITY)
Admission: EM | Admit: 2020-01-31 | Discharge: 2020-01-31 | Disposition: A | Discharge status: Home / Self Care | Payer: Medicaid Other | Attending: Emergency Medicine | Admitting: Emergency Medicine

## 2020-01-31 ENCOUNTER — Emergency Department (HOSPITAL_COMMUNITY): Payer: Medicaid Other

## 2020-01-31 ENCOUNTER — Emergency Department: Payer: Self-pay

## 2020-01-31 DIAGNOSIS — L03116 Cellulitis of left lower limb: Secondary | ICD-10-CM | POA: Diagnosis not present

## 2020-01-31 DIAGNOSIS — E119 Type 2 diabetes mellitus without complications: Secondary | ICD-10-CM | POA: Diagnosis not present

## 2020-01-31 DIAGNOSIS — M869 Osteomyelitis, unspecified: Secondary | ICD-10-CM | POA: Diagnosis not present

## 2020-01-31 DIAGNOSIS — F1210 Cannabis abuse, uncomplicated: Secondary | ICD-10-CM | POA: Diagnosis not present

## 2020-01-31 DIAGNOSIS — T82524A Displacement of infusion catheter, initial encounter: Secondary | ICD-10-CM | POA: Diagnosis present

## 2020-01-31 DIAGNOSIS — I10 Essential (primary) hypertension: Secondary | ICD-10-CM | POA: Diagnosis not present

## 2020-01-31 DIAGNOSIS — Z79899 Other long term (current) drug therapy: Secondary | ICD-10-CM | POA: Diagnosis not present

## 2020-01-31 DIAGNOSIS — D649 Anemia, unspecified: Secondary | ICD-10-CM

## 2020-01-31 DIAGNOSIS — Z452 Encounter for adjustment and management of vascular access device: Secondary | ICD-10-CM

## 2020-01-31 DIAGNOSIS — Z87891 Personal history of nicotine dependence: Secondary | ICD-10-CM | POA: Insufficient documentation

## 2020-01-31 LAB — I-STAT CHEM 8, ED
BUN: 15 mg/dL (ref 6–20)
Calcium, Ion: 1.25 mmol/L (ref 1.15–1.40)
Chloride: 110 mmol/L (ref 98–111)
Creatinine, Ser: 1.2 mg/dL (ref 0.61–1.24)
Glucose, Bld: 121 mg/dL — ABNORMAL HIGH (ref 70–99)
HCT: 28 % — ABNORMAL LOW (ref 39.0–52.0)
Hemoglobin: 9.5 g/dL — ABNORMAL LOW (ref 13.0–17.0)
Potassium: 4.3 mmol/L (ref 3.5–5.1)
Sodium: 143 mmol/L (ref 135–145)
TCO2: 25 mmol/L (ref 22–32)

## 2020-01-31 NOTE — ED Notes (Signed)
Iv team at bedside  

## 2020-01-31 NOTE — ED Provider Notes (Addendum)
[facility] EMERGENCY DEPARTMENT Provider Note   CSN: [id number] Arrival date & time: 01/31/20  1131     History Chief Complaint  Patient presents with  . PICC Line    complication    Eugene Diaz is a 54 y.o. male.  Chief complaint: PICC line accidentally removed from left arm.  Patient is requiring intravenous antibiotics for a left lower extremity cellulitis.  No fever, sweats, chills.  No other complaints.  Patient was seen by his podiatrist yesterday ([name]) in [place] and the wound was examined.  No mention was made of discontinuation of IV antibiotics.  Patient is unable to feel the stump of his foot.        Past Medical History:  Diagnosis Date  . Alcohol abuse   . Alcoholic peripheral neuropathy ([place])   . Anemia   . Anxiety   . Arthritis   . Asthma    followed by pcp  . B12 deficiency   . Cellulitis of left lower leg    07-2019 and left foot  . Depression   . Diabetic neuropathy ([place])   . Essential tremor    neurologist-- dr patel--- due to alcohol abuse  . GERD (gastroesophageal reflux disease)   . History of esophageal stricture    s/p  dilatation 02-13-2018  . Hypercholesterolemia   . Hypertension    followed by pcp  (nuclear stress test 03-11-2014 low risk w/ no ischemia, ef 65%  . Osteomyelitis of great toe ([place])    left foot s/p amputation 08-14-2019  . Peripheral vascular disease ([place])   . Thrombocytopenia (HCC)    chronic  . Type 2 diabetes mellitus ([place])    followed by pcp--- last A1c in epic 6.6 on 07-12-2019  . Wound dehiscence, surgical    post left great toe amputation 08-14-2019    Patient Active Problem List   Diagnosis Date Noted  . Acute blood loss anemia 01/04/2020  . Asthma   . Depression with anxiety   . Osteomyelitis of ankle or foot, acute, left ([place]) 12/17/2019  . Hyperglycemia 12/17/2019  . Type 2 diabetes mellitus with foot ulcer ([place])   . Anemia, chronic disease 11/10/2019  . Acute osteomyelitis of left foot  ([place]) 11/10/2019  . Marijuana use 11/10/2019  . Wound dehiscence 08/31/2019  . Osteomyelitis due to type 2 diabetes mellitus ([place]) 08/14/2019  . Acute osteomyelitis of toe of left foot ([place]) 08/14/2019  . Sepsis ([place]) 08/14/2019  . Hyponatremia 08/14/2019  . Normocytic anemia 08/14/2019  . B12 deficiency 08/14/2019  . Osteomyelitis of foot ([place])   . Cellulitis of toe, left   . Diabetic infection of left foot ([place])   . Toe fracture, left 07/13/2019  . Cellulitis 07/12/2019  . GERD (gastroesophageal reflux disease) 12/19/2018  . Hepatic steatosis 06/18/2018  . Gastritis, erosive   . Dysphagia, idiopathic 12/27/2017  . Rectal bleeding 12/27/2017  . Intention tremor 06/03/2016  . Left knee pain 12/04/2015  . Alcoholic peripheral neuropathy ([place]) 10/08/2014  . Diabetic neuropathy ([place]) 10/08/2014  . Chest pain, rule out acute myocardial infarction 03/10/2014  . Thrombocytopenia ([place]) 03/10/2014  . ETOH abuse 03/10/2014  . Chest pain 03/10/2014  . Diabetes mellitus without complication ([place])   . Hypertension associated with type 2 diabetes mellitus ([place])   . Hyperlipidemia associated with type 2 diabetes mellitus [facility])     Past Surgical History:  Procedure Laterality Date  . AMPUTATION Left 10/16/2019   Procedure: Left partial second ray resection; placement of  antibiotic beads;  Surgeon: Evelina Bucy, DPM;  [facility]: [facility];  Service: Podiatry;  Laterality: Left;  . AMPUTATION Left 11/13/2019   Procedure: Left Midfoot Amputation - Transmetatarsal vs. Lisfranc; Placement antibiotic beads;  Surgeon: Evelina Bucy, DPM;  [facility];  Service: Podiatry;  Laterality: Left;  . AMPUTATION Left 12/21/2019   Procedure: Chopart Amputation left foot;  Surgeon: Evelina Bucy, DPM;  Location: [place];  Service: Podiatry;  Laterality: Left;  . AMPUTATION TOE Left 08/14/2019   Procedure: AMPUTATION GREAT TOE;  Surgeon: Evelina Bucy, DPM;  [facility];  Service: Podiatry;   Laterality: Left;  . APPLICATION OF WOUND VAC Left 12/21/2019   Procedure: Application Of Wound Vac;  Surgeon: Evelina Bucy, DPM;  Location: [place];  Service: Podiatry;  Laterality: Left;  . BIOPSY  02/13/2018   Procedure: BIOPSY;  Surgeon: Danie Binder, MD;  [facility];  Service: Endoscopy;;  transverse colon biopsy, gastric biopsy  . COLONOSCOPY WITH PROPOFOL N/A 02/13/2018   Procedure: COLONOSCOPY WITH PROPOFOL;  Surgeon: Danie Binder, MD;  [facility];  Service: Endoscopy;  Laterality: N/A;  11:15am  . ESOPHAGOGASTRODUODENOSCOPY (EGD) WITH PROPOFOL N/A 02/13/2018   Procedure: ESOPHAGOGASTRODUODENOSCOPY (EGD) WITH PROPOFOL;  Surgeon: Danie Binder, MD;  [facility];  Service: Endoscopy;  Laterality: N/A;  . I & D EXTREMITY Left 07/16/2019   Procedure: Insicion  AND DEBRIDEMENT EXTREMITY;  Surgeon: Evelina Bucy, DPM;  Location: [place];  Service: Podiatry;  Laterality: Left;  . INCISION AND DRAINAGE OF WOUND Left 10/13/2019   Procedure: IRRIGATION AND DEBRIDEMENT WOUND OF LEFT FOOT AND FIRST METATARSAL RESECTION;  Surgeon: Evelina Bucy, DPM;  [facility];  Service: Podiatry;  Laterality: Left;  . IRRIGATION AND DEBRIDEMENT FOOT Left 11/11/2019   Procedure: Left Foot Wound Irrigation and Debridement;  Surgeon: Evelina Bucy, DPM;  [facility];  Service: Podiatry;  Laterality: Left;  . Left arm     Left arm repair (tendon and artery)  . PERCUTANEOUS PINNING  07/16/2019   Procedure: Open Reduction Percutaneous Pinning Extremity;  Surgeon: Evelina Bucy, DPM;  Location: [place];  Service: Podiatry;;  . PILONIDAL CYST EXCISION N/A 08/08/2014   Procedure: CYST EXCISION PILONIDAL EXTENSIVE;  Surgeon: Jamesetta So, MD;  [facility];  Service: General;  Laterality: N/A;  . POLYPECTOMY  02/13/2018   Procedure: POLYPECTOMY;  Surgeon: Danie Binder, MD;  [facility];  Service: Endoscopy;;  transverse colon polyp hs, rectal  polyps times 2  . SAVORY DILATION N/A 02/13/2018   Procedure: SAVORY DILATION;  Surgeon: Danie Binder, MD;  [facility];  Service: Endoscopy;  Laterality: N/A;  . WOUND DEBRIDEMENT Left 09/04/2019   Procedure: DEBRIDEMENT WOUND WITH COMPLEX  REPAIR OF DEHISCENCE;  Surgeon: Evelina Bucy, DPM;  Location: [place];  Service: Podiatry;  Laterality: Left;  Leave patient on stretcher  . WOUND DEBRIDEMENT Left 10/16/2019   Procedure: Left foot wound debridement and closure;  Surgeon: Evelina Bucy, DPM;  [facility];  Service: Podiatry;  Laterality: Left;  . WOUND DEBRIDEMENT Left 12/18/2019   Procedure: LEFT FOOT DEBRIDEMENT WITH PARTIAL INCISION OF INFECTED BONE;  Surgeon: Evelina Bucy, DPM;  Location: [place];  Service: Podiatry;  Laterality: Left;  LEFT FOOT DEBRIDEMENT WITH PARTIAL INCISION OF INFECTED BONE       Family History  Problem Relation Age of Onset  .  Heart attack Mother        Living, 38  . Cancer Father        Deceased  . Healthy Brother   . Healthy Sister   . Colon cancer Neg Hx   . Colon polyps Neg Hx     Social History   Tobacco Use  . Smoking status: Former Smoker    Packs/day: 2.50    Years: 25.00    Pack years: 62.50    Types: Cigarettes    Quit date: 08/05/1984    Years since quitting: 35.5  . Smokeless tobacco: Never Used  Substance Use Topics  . Alcohol use: Yes    Alcohol/week: 50.0 standard drinks    Types: 50 Standard drinks or equivalent per week    Comment: Drinks 6 beers, liquor 4-6oz (both daily) x 35 years  . Drug use: Not Currently    Types: Marijuana    Comment: 09-03-2019  per pt states very seldom    Home Medications Prior to Admission medications   Medication Sig Start Date End Date Taking? Authorizing Provider  albuterol (PROVENTIL HFA;VENTOLIN HFA) 108 (90 Base) MCG/ACT inhaler Inhale 2 puffs into the lungs every 6 (six) hours as needed for wheezing or shortness of breath. 10/31/18  Yes Hayden Rasmussen, MD  ALPRAZolam Duanne Moron) 1 MG tablet Take 0.5 tablets (0.5 mg total) by mouth 4 (four) times daily as needed for anxiety. 01/21/20  Yes Caren Griffins, MD  acetaminophen (TYLENOL) 325 MG tablet Take 2 tablets (650 mg total) by mouth every 6 (six) hours as needed for mild pain (or Fever >/= 101). Patient not taking: Reported on 01/19/2020 12/27/19   Arrien, [name], MD  amitriptyline (ELAVIL) 100 MG tablet Take 100 mg by mouth at bedtime.    [provider]  atorvastatin (LIPITOR) 40 MG tablet Take 40 mg by mouth every evening.  12/17/15   [provider]  diclofenac Sodium (VOLTAREN) 1 % GEL Apply 1 application topically daily as needed (pain).    [provider]  docusate sodium (COLACE) 100 MG capsule Take 100 mg by mouth daily as needed for mild constipation.    [provider]  DULoxetine (CYMBALTA) 60 MG capsule Take 60 mg by mouth daily. 11/18/19   [provider]  ferrous sulfate 325 (65 FE) MG tablet Take 1 tablet (325 mg total) by mouth daily. 11/15/19 11/14/20  Shelly Coss, MD  gabapentin (NEURONTIN) 300 MG capsule Take 92m in the morning and at noon, then take 12045mat bedtime Patient taking differently: Take 900-1,200 mg by mouth See admin instructions. Take 3 capsules (900 mg) by mouth every morning and at noon, take 4 capsules (1200 mg) at bedtime 06/14/19   PaNarda Amber, DO  glimepiride (AMARYL) 4 MG tablet Take 4 mg by mouth daily. 08/10/19   [provider]  glycopyrrolate (ROBINUL) 1 MG tablet Take 1 mg by mouth 2 (two) times daily.  12/17/15   [provider]  hydrOXYzine (ATARAX/VISTARIL) 25 MG tablet Take 25 mg by mouth every 4 (four) hours as needed for anxiety.  06/17/19   [provider]  ibuprofen (ADVIL) 400 MG tablet Take 1 tablet (400 mg total) by mouth every 8 (eight) hours as needed for moderate pain. Patient not taking: Reported on 01/30/2020 12/27/19   Arrien, MaJimmy PicketMD    lisinopril (PRINIVIL,ZESTRIL) 20 MG tablet Take 20 mg by mouth daily.  12/30/15   [provider]  omeprazole (PRILOSEC) 20 MG  capsule TAKE 1 CAPSULE WITH BREAKFAST AND SUPPER. Patient not taking: No sig reported 11/19/19   Mahala Menghini, PA-C  Oxycodone HCl 10 MG TABS Take 1 tablet (10 mg total) by mouth every 6 (six) hours as needed (severe pain). 01/21/20   Caren Griffins, MD  piperacillin-tazobactam (ZOSYN) IVPB Inject 13.5 g into the vein daily for 12 days. As a continuous infusion. Indication:  Left foot osteo Last Day of Therapy:  02/01/20 Labs - Once weekly:  CBC/D and BMP, Labs - Every other week:  ESR and CRP Patient not taking: Reported on 01/30/2020 01/20/20 02/01/20  Caren Griffins, MD  primidone (MYSOLINE) 50 MG tablet Take 1 tablet (50 mg total) by mouth 2 (two) times daily. Patient taking differently: Take 50 mg by mouth 2 (two) times daily.  06/14/19   Narda Amber K, DO    Allergies    Patient has no known allergies.  Review of Systems   Review of Systems  All other systems reviewed and are negative.   Physical Exam Updated Vital Signs BP (!) 160/87 (BP Location: Right Arm)   Pulse 76   Temp 98.6 F (37 C) (Oral)   Resp 14   Ht _0  (1.88 m)   Wt 131 kg   SpO2 100%   BMI 37.08 kg/m   Physical Exam Vitals and nursing note reviewed.  Constitutional:      Appearance: He is well-developed.  HENT:     Head: Normocephalic and atraumatic.  Eyes:     Conjunctiva/sclera: Conjunctivae normal.  Cardiovascular:     Rate and Rhythm: Normal rate and regular rhythm.  Pulmonary:     Effort: Pulmonary effort is normal.     Breath sounds: Normal breath sounds.  Abdominal:     General: Bowel sounds are normal.     Palpations: Abdomen is soft.  Musculoskeletal:     Cervical back: Neck supple.  Skin:    General: Skin is warm and dry.     Comments: Left lower extremity: Left foot mid amputation examined.  Soft tissue is still beefy, erythematous.  No obvious  pus.  Neurological:     General: No focal deficit present.     Mental Status: He is alert and oriented to person, place, and time.  Psychiatric:        Behavior: Behavior normal.     ED Results / Procedures / Treatments   Labs (all labs ordered are listed, but only abnormal results are displayed) Labs Reviewed  I-STAT CHEM 8, ED - Abnormal; Notable for the following components:      Result Value   Glucose, Bld 121 (*)    Hemoglobin 9.5 (*)    HCT 28.0 (*)    All other components within normal limits    EKG None  Radiology DG Foot Complete Left  Result Date: 01/31/2020 CLINICAL DATA:  Drainage from surgical wound LEFT foot, on antibiotics since foot surgery earlier this month question osteomyelitis EXAM: LEFT FOOT - COMPLETE 3+ VIEW COMPARISON:  01/17/2020 FINDINGS: Interval removal of skin clips. Prior midfoot amputation. Soft tissue swelling at the amputation bed, increased. Small amount of gas attenuation is seen at a superficial wound at the amputation bed. Bone destruction identified at the dorsal margin of the distal calcaneus concerning for osteomyelitis. On the lateral view, questionable bone destruction at the distal calcaneus cannot exclude osteomyelitis at this site as well. Small plantar and Achilles insertion calcaneal spurs. No other focal osseous abnormalities. IMPRESSION: Suspected bone  destruction at the distal dorsal talus and at the distal calcaneus concerning for osteomyelitis; this could be better evaluated by MR. Electronically Signed   By: Lavonia Dana M.D.   On: 01/31/2020 14:15   [place] EKG SITE RITE  Result Date: 01/31/2020 If Site Rite image not attached, placement could not be confirmed due to current cardiac rhythm.   Procedures Procedures (including critical care time)  Medications Ordered in ED Medications - No data to display  ED Course  I have reviewed the triage vital signs and the nursing notes.  Pertinent labs & imaging results that were  available during my care of the patient were reviewed by me and considered in my medical decision making (see chart for details).    MDM Rules/Calculators/A&P                      Discussed with PICC line team.  Will come to the hospital to insert new line.  1335: Wound examined.  Evidence of infection still noted.  Will obtain plain films of foot to rule out osteomyelitis.  Discussed with PICC RN at [phone].  1500:  Disc c Dr  Roderic Palau Final Clinical Impression(s) / ED Diagnoses Final diagnoses:  Needs peripherally inserted central catheter (PICC)  Anemia, unspecified type  Cellulitis of left foot  Osteomyelitis of left foot, unspecified type [facility])    Rx / DC Orders ED Discharge Orders    None       Nat Christen, MD 01/31/20 1253    Nat Christen, MD 01/31/20 1358    Nat Christen, MD 02/03/20 [phone]

## 2020-01-31 NOTE — ED Triage Notes (Signed)
Patient presents to the ED with his PICC line that had been accidentally removed at home one day ago.   Patient originally using PICC line for home health to administer antibiotics after his left foot surgery earlier last month.

## 2020-01-31 NOTE — Discharge Instructions (Addendum)
Follow up with your md if needed °

## 2020-01-31 NOTE — Progress Notes (Signed)
Peripherally Inserted Central Catheter Placement  The IV Nurse has discussed with the patient and/or persons authorized to consent for the patient, the purpose of this procedure and the potential benefits and risks involved with this procedure.  The benefits include less needle sticks, lab draws from the catheter, and the patient may be discharged home with the catheter. Risks include, but not limited to, infection, bleeding, blood clot (thrombus formation), and puncture of an artery; nerve damage and irregular heartbeat and possibility to perform a PICC exchange if needed/ordered by physician.  Alternatives to this procedure were also discussed.  Bard Power PICC patient education guide, fact sheet on infection prevention and patient information card has been provided to patient /or left at bedside.    PICC Placement Documentation  PICC Single Lumen 0000000 PICC Left Basilic 51 cm 0 cm (Active)  Indication for Insertion or Continuance of Line Home intravenous therapies (PICC only) 01/31/20 1900  Exposed Catheter (cm) 0 cm 01/31/20 1900  Site Assessment Clean;Dry;Intact 01/31/20 1900  Line Status Flushed;Saline locked;Blood return noted 01/31/20 1900  Dressing Type Transparent;Securing device 01/31/20 1900  Dressing Status Clean;Dry;Intact;Antimicrobial disc in place 01/31/20 1900  Dressing Change Due 02/07/20 01/31/20 1900       Darlyn Read 01/31/2020, 7:07 PM

## 2020-01-31 NOTE — ED Notes (Signed)
IV team notified.

## 2020-02-06 ENCOUNTER — Other Ambulatory Visit: Payer: Self-pay

## 2020-02-06 ENCOUNTER — Ambulatory Visit (INDEPENDENT_AMBULATORY_CARE_PROVIDER_SITE_OTHER): Payer: Medicaid Other | Admitting: Podiatry

## 2020-02-06 VITALS — Temp 96.8°F

## 2020-02-06 DIAGNOSIS — R600 Localized edema: Secondary | ICD-10-CM

## 2020-02-06 DIAGNOSIS — M216X2 Other acquired deformities of left foot: Secondary | ICD-10-CM | POA: Diagnosis not present

## 2020-02-06 NOTE — Progress Notes (Signed)
  Subjective:  Patient ID: Eugene Diaz, male    DOB: 07-15-1966,  MRN: [id number]  Chief Complaint  Patient presents with  . Wound Check    L foot. Pt stated, "It's the same. My sister changed the dressing once this week. My PICC line was replaced at the hospital on 01/31/2020.  No fever/chills/foul odor/N&V".     DOS: 12/21/19 Procedure: Chopart Amputation left  54 y.o. male presents with the above complaint. History confirmed with patient.   Objective:  Physical Exam: tenderness at the surgical site, local edema noted and calf supple, nontender. Incision: slow healing, slight clear drainage present, dehiscence present but no probe to bone. No warmth or erythema, no signs of acute infection.  Assessment:   1. Localized edema     Plan:  Patient was evaluated and treated and all questions answered.  Post-operative State -Dressing applied consisting of unna boot, compressive bandage for reduction of edema -NWB with crutches  No follow-ups on file.

## 2020-02-12 ENCOUNTER — Telehealth: Payer: Self-pay | Admitting: *Deleted

## 2020-02-12 NOTE — Telephone Encounter (Signed)
[place], PT states she was to have her last visit with pt today, but he looks terrible and clammy, oral temperature 99.7, with bloody drainage to the unna boot and swelling and redness to the top of the boot and pt complains of throbbing.

## 2020-02-12 NOTE — Telephone Encounter (Signed)
I informed Stacy, PT of the new appt time and that I had left message for pt if he should worsen to go to the ED. Stacy, PT states she will contact pt also.

## 2020-02-12 NOTE — Telephone Encounter (Signed)
I spoke with [place] and pt is scheduled for 02/13/2020 at 11:30am. I left message for pt informing of the new appt 02/13/2020 at 11:30am and if he should worsen through the night go to the ED.

## 2020-02-13 ENCOUNTER — Ambulatory Visit: Payer: Medicaid Other | Admitting: Podiatry

## 2020-02-13 ENCOUNTER — Ambulatory Visit (INDEPENDENT_AMBULATORY_CARE_PROVIDER_SITE_OTHER): Payer: Medicaid Other

## 2020-02-13 ENCOUNTER — Other Ambulatory Visit: Payer: Self-pay

## 2020-02-13 ENCOUNTER — Other Ambulatory Visit: Payer: Self-pay | Admitting: Podiatry

## 2020-02-13 VITALS — Temp 98.7°F

## 2020-02-13 DIAGNOSIS — L97524 Non-pressure chronic ulcer of other part of left foot with necrosis of bone: Secondary | ICD-10-CM

## 2020-02-13 DIAGNOSIS — M79672 Pain in left foot: Secondary | ICD-10-CM

## 2020-02-13 DIAGNOSIS — E11621 Type 2 diabetes mellitus with foot ulcer: Secondary | ICD-10-CM

## 2020-02-13 MED ORDER — CLINDAMYCIN HCL 300 MG PO CAPS: 300.0000 mg | ORAL_CAPSULE | Freq: Two times a day (BID) | ORAL | 0 refills | Status: DC

## 2020-02-13 NOTE — Progress Notes (Signed)
  Subjective:  Patient ID: Eugene Diaz, male    DOB: 06/23/1966,  MRN: [id number]  Chief Complaint  Patient presents with  . Wound Check    Pt states surgical wound is draining a lot. Denies fever/chills/nausea/vomiting.    DOS: 12/21/19 Procedure: Chopart Amputation left  54 y.o. male presents with the above complaint. History confirmed with patient.   Objective:  Physical Exam: tenderness at the surgical site, local edema noted and calf supple, nontender. Incision: only small areas of incomplete healing.  Slight periwound erythema and erythema of the leg And erythema of the leg Assessment:   1. Pain in left foot     Plan:  Patient was evaluated and treated and all questions answered.  Post-operative State -Continue nonweightbearing to fully healed.  Rx doxycycline for only small amount of wound cellulitis.  X-rays taken do not show frank osteolysis though osteomyelitis cannot be definitively excluded  No follow-ups on file.

## 2020-02-14 ENCOUNTER — Ambulatory Visit: Payer: Medicaid Other | Admitting: Podiatry

## 2020-02-20 ENCOUNTER — Ambulatory Visit (INDEPENDENT_AMBULATORY_CARE_PROVIDER_SITE_OTHER): Payer: Medicaid Other | Admitting: Podiatry

## 2020-02-20 ENCOUNTER — Ambulatory Visit: Payer: Medicaid Other | Admitting: Podiatry

## 2020-02-20 ENCOUNTER — Ambulatory Visit: Payer: Self-pay | Admitting: Podiatry

## 2020-02-20 ENCOUNTER — Other Ambulatory Visit: Payer: Self-pay

## 2020-02-20 DIAGNOSIS — R600 Localized edema: Secondary | ICD-10-CM

## 2020-02-20 DIAGNOSIS — Z9889 Other specified postprocedural states: Secondary | ICD-10-CM

## 2020-02-20 DIAGNOSIS — M216X2 Other acquired deformities of left foot: Secondary | ICD-10-CM

## 2020-02-20 NOTE — Patient Instructions (Signed)
Pre-Operative Instructions  Congratulations, you have decided to take an important step towards improving your quality of life.  You can be assured that the doctors and staff at [facility] will be with you every step of the way.  Here are some important things you should know:  1. Plan to be at the surgery center/hospital at least 1 (one) hour prior to your scheduled time, unless otherwise directed by the surgical center/hospital staff.  You must have a responsible adult accompany you, remain during the surgery and drive you home.  Make sure you have directions to the surgical center/hospital to ensure you arrive on time. 2. If you are having surgery at [facility] or [facility] hospitals, you will need a copy of your medical history and physical form from your family physician within one month prior to the date of surgery. We will give you a form for your primary physician to complete.  3. We make every effort to accommodate the date you request for surgery.  However, there are times where surgery dates or times have to be moved.  We will contact you as soon as possible if a change in schedule is required.   4. No aspirin/ibuprofen for one week before surgery.  If you are on aspirin, any non-steroidal anti-inflammatory medications (Mobic, Aleve, Ibuprofen) should not be taken seven (7) days prior to your surgery.  You make take Tylenol for pain prior to surgery.  5. Medications - If you are taking daily heart and blood pressure medications, seizure, reflux, allergy, asthma, anxiety, pain or diabetes medications, make sure you notify the surgery center/hospital before the day of surgery so they can tell you which medications you should take or avoid the day of surgery. 6. No food or drink after midnight the night before surgery unless directed otherwise by surgical center/hospital staff. 7. No alcoholic beverages 24-hours prior to surgery.  No smoking 24-hours prior or 24-hours after  surgery. 8. Wear loose pants or shorts. They should be loose enough to fit over bandages, boots, and casts. 9. Don't wear slip-on shoes. Sneakers are preferred. 10. Bring your boot with you to the surgery center/hospital.  Also bring crutches or a walker if your physician has prescribed it for you.  If you do not have this equipment, it will be provided for you after surgery. 11. If you have not been contacted by the surgery center/hospital by the day before your surgery, call to confirm the date and time of your surgery. 12. Leave-time from work may vary depending on the type of surgery you have.  Appropriate arrangements should be made prior to surgery with your employer. 13. Prescriptions will be provided immediately following surgery by your doctor.  Fill these as soon as possible after surgery and take the medication as directed. Pain medications will not be refilled on weekends and must be approved by the doctor. 14. Remove nail polish on the operative foot and avoid getting pedicures prior to surgery. 15. Wash the night before surgery.  The night before surgery wash the foot and leg well with water and the antibacterial soap provided. Be sure to pay special attention to beneath the toenails and in between the toes.  Wash for at least three (3) minutes. Rinse thoroughly with water and dry well with a towel.  Perform this wash unless told not to do so by your physician.  Enclosed: 1 Ice pack (please put in freezer the night before surgery)   1 Hibiclens skin cleaner     Pre-op instructions  If you have any questions regarding the instructions, please do not hesitate to call our office.  [place]: [address], [place], [place] [address] -- [phone]  [place]: [address] [place], [place] [address] -- [phone]  [place]: [address], [place], [place] [address] -- [phone]   Website: [email or web address] 

## 2020-02-23 NOTE — Progress Notes (Signed)
  Subjective:  Patient ID: Eugene Diaz, male    DOB: 04-Nov-1965,  MRN: [id number]  Chief Complaint  Patient presents with  . Wound Check    Pt states his boot is uncomfortable.    DOS: 12/21/19 Procedure: Chopart Amputation left  54 y.o. male presents with the above complaint. History confirmed with patient.   Objective:  Physical Exam: tenderness at the surgical site, local edema noted and calf supple, nontender. Incision: only small areas of incomplete healing.  No warmth erythema present Assessment:   1. Post-operative state     Plan:  Patient was evaluated and treated and all questions answered.  Post-operative State -Wounds continue to improve.  Okay to transition to surgical shoe.  Dressed today with Silvadene and dry sterile dressing.  Follow-up in 2 weeks for recheck.  Return in about 2 weeks (around 03/05/2020) for Wound check.

## 2020-03-06 ENCOUNTER — Other Ambulatory Visit: Payer: Self-pay

## 2020-03-06 ENCOUNTER — Ambulatory Visit: Payer: Medicaid Other | Admitting: Podiatry

## 2020-03-06 VITALS — Temp 97.2°F

## 2020-03-06 DIAGNOSIS — E08622 Diabetes mellitus due to underlying condition with other skin ulcer: Secondary | ICD-10-CM

## 2020-03-06 DIAGNOSIS — L97321 Non-pressure chronic ulcer of left ankle limited to breakdown of skin: Secondary | ICD-10-CM

## 2020-03-18 ENCOUNTER — Other Ambulatory Visit: Payer: Self-pay

## 2020-03-18 ENCOUNTER — Encounter (HOSPITAL_BASED_OUTPATIENT_CLINIC_OR_DEPARTMENT_OTHER): Payer: Self-pay | Admitting: Podiatry

## 2020-03-18 NOTE — Progress Notes (Addendum)
ADDENDUM:  Received pt's pcp, Collene Mares PA, H&P dated 03-24-2020 from Dr [name] office, place in chart.  ADDENDUM:   Pt called back and made covid for today @ 1440.  ADDENDUM:  Noted pt did not go for his covid appointment 03-21-2020.  Called and left voice message to call back to reschedule.   Spoke w/ via phone for pre-op interview--- PT Lab needs dos----Istat               Lab results------ current ekg , dated 01-17-2020, in epic/ chart COVID test ------ 03-21-2020 @ 1230 Arrive at ------- 0730 NPO after ------ MN Medications to take morning of surgery ----- Gabapentin, cymbalta w/ sips of water Diabetic medication ----- do not take amaryl morning of surgery Patient Special Instructions ----- asked to bring rescue inhaler dos Pre-Op special Istructions ----- have not received pt's pcp H&P yet.  Asked pt about it, he stated he had given that form to his pcp office at his visit last week Patient verbalized understanding of instructions that were given at this phone interview. Patient denies shortness of breath, chest pain, fever, cough a this phone interview.  Anesthesia Review:  Hx HTN, DM2, alcohol abuse  PCP:  Collene Mares PA ([place] in [place]) pt stated lov last week Cardiologist : no Chest x-ray : 10-31-2018 epic EKG : 01-17-2020 epic Echo : 03-11-2014 epic Stress test:  Nuclear 03-11-2014 epic Cardiac Cath :  no Sleep Study/ CPAP : NO Fasting Blood Sugar :      / Checks Blood Sugar -- times a day:   Pt does not check blood sugar Blood Thinner/ Instructions Maryjane Hurter Dose: NO ASA / Instructions/ Last Dose :  NO

## 2020-03-21 ENCOUNTER — Other Ambulatory Visit (HOSPITAL_COMMUNITY): Payer: Medicaid Other

## 2020-03-23 ENCOUNTER — Other Ambulatory Visit (HOSPITAL_COMMUNITY)
Admission: RE | Admit: 2020-03-23 | Discharge: 2020-03-23 | Disposition: A | Discharge status: Home / Self Care | Payer: Medicaid Other | Source: Ambulatory Visit | Attending: Podiatry | Admitting: Podiatry

## 2020-03-23 ENCOUNTER — Other Ambulatory Visit: Payer: Self-pay

## 2020-03-23 DIAGNOSIS — Z20822 Contact with and (suspected) exposure to covid-19: Secondary | ICD-10-CM | POA: Insufficient documentation

## 2020-03-23 DIAGNOSIS — Z01812 Encounter for preprocedural laboratory examination: Secondary | ICD-10-CM | POA: Insufficient documentation

## 2020-03-23 LAB — SARS CORONAVIRUS 2 (TAT 6-24 HRS): SARS Coronavirus 2: NEGATIVE

## 2020-03-24 NOTE — Progress Notes (Signed)
Patient instructed to arrive at 0800 tomorrow.

## 2020-03-24 NOTE — Progress Notes (Signed)
Left message with shelley durant patient needs h&p for 03-25-2020 surgery.

## 2020-03-25 ENCOUNTER — Encounter (HOSPITAL_BASED_OUTPATIENT_CLINIC_OR_DEPARTMENT_OTHER)
Admission: RE | Disposition: A | Discharge status: Home / Self Care | Payer: Self-pay | Source: Home / Self Care | Attending: Podiatry

## 2020-03-25 ENCOUNTER — Ambulatory Visit (HOSPITAL_BASED_OUTPATIENT_CLINIC_OR_DEPARTMENT_OTHER)
Admission: RE | Admit: 2020-03-25 | Discharge: 2020-03-25 | Disposition: A | Discharge status: Home / Self Care | Payer: Medicaid Other | Attending: Podiatry | Admitting: Podiatry

## 2020-03-25 ENCOUNTER — Other Ambulatory Visit: Payer: Self-pay | Admitting: Podiatry

## 2020-03-25 ENCOUNTER — Ambulatory Visit (HOSPITAL_BASED_OUTPATIENT_CLINIC_OR_DEPARTMENT_OTHER): Payer: Medicaid Other | Admitting: Anesthesiology

## 2020-03-25 DIAGNOSIS — L03116 Cellulitis of left lower limb: Secondary | ICD-10-CM | POA: Diagnosis not present

## 2020-03-25 DIAGNOSIS — M216X2 Other acquired deformities of left foot: Secondary | ICD-10-CM

## 2020-03-25 DIAGNOSIS — Z5309 Procedure and treatment not carried out because of other contraindication: Secondary | ICD-10-CM | POA: Diagnosis not present

## 2020-03-25 HISTORY — DX: Localized edema: R60.0

## 2020-03-25 HISTORY — DX: Anemia, unspecified: D64.9

## 2020-03-25 HISTORY — DX: Other specified postprocedural states: Z98.890

## 2020-03-25 SURGERY — LENGTHENING, TENDON
Anesthesia: Monitor Anesthesia Care

## 2020-03-25 MED ORDER — CEFAZOLIN SODIUM-DEXTROSE 2-4 GM/100ML-% IV SOLN
INTRAVENOUS | Status: AC
  Filled 2020-03-25: qty 100

## 2020-03-25 MED ORDER — DOXYCYCLINE HYCLATE 100 MG PO TABS
100.0000 mg | ORAL_TABLET | Freq: Two times a day (BID) | ORAL | 0 refills | Status: DC
Start: 2020-03-25 — End: 2020-04-20

## 2020-03-25 MED ORDER — LACTATED RINGERS IV SOLN: INTRAVENOUS | Status: DC

## 2020-03-25 MED ORDER — CEFAZOLIN SODIUM-DEXTROSE 2-4 GM/100ML-% IV SOLN: 2.0000 g | INTRAVENOUS | Status: DC

## 2020-03-25 SURGICAL SUPPLY — 57 items
APL PRP STRL LF DISP 70% ISPRP (MISCELLANEOUS)
BANDAGE ESMARK 6X9 LF (GAUZE/BANDAGES/DRESSINGS) ×1 IMPLANT
BLADE HEX COATED 2.75 (ELECTRODE) ×1 IMPLANT
BLADE SURG 15 STRL LF DISP TIS (BLADE) ×2 IMPLANT
BLADE SURG 15 STRL SS (BLADE)
BNDG CMPR 9X6 STRL LF SNTH (GAUZE/BANDAGES/DRESSINGS)
BNDG ELASTIC 3X5.8 VLCR STR LF (GAUZE/BANDAGES/DRESSINGS) ×1 IMPLANT
BNDG ELASTIC 4X5.8 VLCR STR LF (GAUZE/BANDAGES/DRESSINGS) IMPLANT
BNDG ESMARK 6X9 LF (GAUZE/BANDAGES/DRESSINGS)
BNDG GAUZE ELAST 4 BULKY (GAUZE/BANDAGES/DRESSINGS) ×1 IMPLANT
CHLORAPREP W/TINT 26 (MISCELLANEOUS) ×1 IMPLANT
COVER BACK TABLE 60X90IN (DRAPES) ×1 IMPLANT
COVER WAND RF STERILE (DRAPES) ×1 IMPLANT
CUFF TOURN SGL QUICK 18X4 (TOURNIQUET CUFF) IMPLANT
CUFF TOURN SGL QUICK 24 (TOURNIQUET CUFF)
CUFF TRNQT CYL 24X4X16.5-23 (TOURNIQUET CUFF) IMPLANT
DRAPE EXTREMITY T 121X128X90 (DISPOSABLE) ×1 IMPLANT
DRAPE U-SHAPE 47X51 STRL (DRAPES) ×1 IMPLANT
DRSG EMULSION OIL 3X3 NADH (GAUZE/BANDAGES/DRESSINGS) ×1 IMPLANT
DRSG PAD ABDOMINAL 8X10 ST (GAUZE/BANDAGES/DRESSINGS) IMPLANT
ELECT REM PT RETURN 9FT ADLT (ELECTROSURGICAL)
ELECTRODE REM PT RTRN 9FT ADLT (ELECTROSURGICAL) ×1 IMPLANT
GAUZE SPONGE 4X4 12PLY STRL (GAUZE/BANDAGES/DRESSINGS) ×1 IMPLANT
GAUZE SPONGE 4X4 12PLY STRL LF (GAUZE/BANDAGES/DRESSINGS) ×1 IMPLANT
GLOVE BIO SURGEON STRL SZ7.5 (GLOVE) ×1 IMPLANT
GLOVE BIOGEL PI IND STRL 8 (GLOVE) ×1 IMPLANT
GLOVE BIOGEL PI INDICATOR 8 (GLOVE)
GOWN STRL REUS W/ TWL XL LVL3 (GOWN DISPOSABLE) ×2 IMPLANT
GOWN STRL REUS W/TWL XL LVL3 (GOWN DISPOSABLE) ×2
NDL HYPO 25X1 1.5 SAFETY (NEEDLE) IMPLANT
NEEDLE HYPO 25X1 1.5 SAFETY (NEEDLE) IMPLANT
NS IRRIG 1000ML POUR BTL (IV SOLUTION) IMPLANT
PAD CAST 4YDX4 CTTN HI CHSV (CAST SUPPLIES) IMPLANT
PADDING CAST ABS 4INX4YD NS (CAST SUPPLIES)
PADDING CAST ABS COTTON 4X4 ST (CAST SUPPLIES) ×1 IMPLANT
PADDING CAST COTTON 4X4 STRL (CAST SUPPLIES)
PENCIL SMOKE EVACUATOR (MISCELLANEOUS) ×1 IMPLANT
SET BASIN DAY SURGERY F.S. (CUSTOM PROCEDURE TRAY) ×1 IMPLANT
SLEEVE SCD COMPRESS KNEE MED (MISCELLANEOUS) ×1 IMPLANT
STOCKINETTE 6  STRL (DRAPES)
STOCKINETTE 6 STRL (DRAPES) ×1 IMPLANT
SUCTION FRAZIER HANDLE 10FR (MISCELLANEOUS)
SUCTION TUBE FRAZIER 10FR DISP (MISCELLANEOUS) IMPLANT
SUT ETHIBOND 2-0 V-5 NDL (SUTURE) ×1 IMPLANT
SUT ETHIBOND 2-0 V-5 NEEDLE (SUTURE) IMPLANT
SUT ETHILON 3 0 PS 1 (SUTURE) ×1 IMPLANT
SUT MERSILENE 4 0 P 3 (SUTURE) IMPLANT
SUT SILK 3 0 SH 30 (SUTURE) ×1 IMPLANT
SUT VIC AB 0 CT1 36 (SUTURE) IMPLANT
SUT VIC AB 2-0 SH 27 (SUTURE)
SUT VIC AB 2-0 SH 27XBRD (SUTURE) IMPLANT
SUT VIC AB 3-0 FS2 27 (SUTURE) ×1 IMPLANT
SUT VICRYL 4-0 PS2 18IN ABS (SUTURE) IMPLANT
SYR BULB EAR ULCER 3OZ GRN STR (SYRINGE) ×1 IMPLANT
SYR CONTROL 10ML LL (SYRINGE) IMPLANT
TOWEL OR 17X26 10 PK STRL BLUE (TOWEL DISPOSABLE) ×1 IMPLANT
UNDERPAD 30X36 HEAVY ABSORB (UNDERPADS AND DIAPERS) ×1 IMPLANT

## 2020-03-25 NOTE — Progress Notes (Signed)
Case cancelled by [name] due to cellulitis in operative leg.  Patient's stump wrapped in ace wrap by [name] and lower leg wrapped in ace wrap by this RN per Dr. Eleanora Neighbor verbal order. . Patient instructed by [name] to come to office to start on antibiotic then will reschedule surgery. Patient's sister,Laura, instructed on this as well when she arrived to transport patient home. Both patient and his sister verbalized understanding.

## 2020-03-25 NOTE — Progress Notes (Signed)
  Subjective:  Patient ID: Eugene Diaz, male    DOB: 08-May-1966,  MRN: [id number]  Patient seen in pre-op. Has dirty dressing to the left foot. Objective:  There were no vitals filed for this visit. General AA&O x3. Normal mood and affect.  Vascular Dorsalis pedis and posterior tibial pulses 2/4 bilat. Brisk capillary refill to all digits. Pedal hair present.  Neurologic Epicritic sensation grossly intact.  Dermatologic Cellulitis edema LLE. No open wounds  Orthopedic: MMT 5/5 in dorsiflexion, plantarflexion, inversion, and eversion. Normal joint ROM without pain or crepitus.    Assessment & Plan:  Patient was evaluated and treated and all questions answered.  Cellulitis LLE -Given Cellulitis of LLE discussed with patient we will have to cancel today's procedure. Patient understands. -Will Rx PO abx for cellulitis -F/u next week for cellulitis check.  Evelina Bucy, DPM  Accessible via secure chat for questions or concerns.

## 2020-03-25 NOTE — Anesthesia Preprocedure Evaluation (Deleted)
Anesthesia Evaluation  Patient identified by MRN, date of birth, ID band Patient awake    Reviewed: Allergy & Precautions, NPO status , Patient's Chart, lab work & pertinent test results  Airway Mallampati: II  TM Distance: >3 FB Neck ROM: Full    Dental no notable dental hx.    Pulmonary neg pulmonary ROS, former smoker,    Pulmonary exam normal breath sounds clear to auscultation       Cardiovascular hypertension, Normal cardiovascular exam Rhythm:Regular Rate:Normal     Neuro/Psych negative neurological ROS  negative psych ROS   GI/Hepatic GERD  ,(+)     substance abuse  alcohol use, Alcoholic   Endo/Other  diabetesMorbid obesity  Renal/GU negative Renal ROS  negative genitourinary   Musculoskeletal negative musculoskeletal ROS (+)   Abdominal   Peds negative pediatric ROS (+)  Hematology negative hematology ROS (+)   Anesthesia Other Findings   Reproductive/Obstetrics negative OB ROS                             Anesthesia Physical Anesthesia Plan  ASA: IV  Anesthesia Plan: MAC   Post-op Pain Management:    Induction: Intravenous  PONV Risk Score and Plan:   Airway Management Planned: Simple Face Mask  Additional Equipment:   Intra-op Plan:   Post-operative Plan:   Informed Consent: I have reviewed the patients History and Physical, chart, labs and discussed the procedure including the risks, benefits and alternatives for the proposed anesthesia with the patient or authorized representative who has indicated his/her understanding and acceptance.     Dental advisory given  Plan Discussed with: CRNA and Surgeon  Anesthesia Plan Comments:         Anesthesia Quick Evaluation

## 2020-03-25 NOTE — Progress Notes (Signed)
Rx doxycycline. Sent to pharmacy.

## 2020-04-02 ENCOUNTER — Other Ambulatory Visit: Payer: Self-pay

## 2020-04-02 ENCOUNTER — Ambulatory Visit (INDEPENDENT_AMBULATORY_CARE_PROVIDER_SITE_OTHER): Payer: Medicaid Other | Admitting: Podiatry

## 2020-04-02 VITALS — Temp 97.3°F

## 2020-04-02 DIAGNOSIS — L97321 Non-pressure chronic ulcer of left ankle limited to breakdown of skin: Secondary | ICD-10-CM | POA: Diagnosis not present

## 2020-04-02 DIAGNOSIS — E08622 Diabetes mellitus due to underlying condition with other skin ulcer: Secondary | ICD-10-CM | POA: Diagnosis not present

## 2020-04-02 DIAGNOSIS — M216X2 Other acquired deformities of left foot: Secondary | ICD-10-CM | POA: Diagnosis not present

## 2020-04-09 ENCOUNTER — Emergency Department (HOSPITAL_COMMUNITY): Payer: Medicaid Other

## 2020-04-09 ENCOUNTER — Other Ambulatory Visit: Payer: Self-pay

## 2020-04-09 ENCOUNTER — Encounter (HOSPITAL_COMMUNITY): Payer: Self-pay | Admitting: *Deleted

## 2020-04-09 ENCOUNTER — Emergency Department (HOSPITAL_COMMUNITY)
Admission: EM | Admit: 2020-04-09 | Discharge: 2020-04-09 | Disposition: A | Discharge status: Home / Self Care | Payer: Medicaid Other | Attending: Emergency Medicine | Admitting: Emergency Medicine

## 2020-04-09 DIAGNOSIS — S0081XA Abrasion of other part of head, initial encounter: Secondary | ICD-10-CM | POA: Insufficient documentation

## 2020-04-09 DIAGNOSIS — Z23 Encounter for immunization: Secondary | ICD-10-CM | POA: Diagnosis not present

## 2020-04-09 DIAGNOSIS — I10 Essential (primary) hypertension: Secondary | ICD-10-CM | POA: Insufficient documentation

## 2020-04-09 DIAGNOSIS — Y929 Unspecified place or not applicable: Secondary | ICD-10-CM | POA: Insufficient documentation

## 2020-04-09 DIAGNOSIS — Z7984 Long term (current) use of oral hypoglycemic drugs: Secondary | ICD-10-CM | POA: Diagnosis not present

## 2020-04-09 DIAGNOSIS — Z79899 Other long term (current) drug therapy: Secondary | ICD-10-CM | POA: Insufficient documentation

## 2020-04-09 DIAGNOSIS — W19XXXA Unspecified fall, initial encounter: Secondary | ICD-10-CM | POA: Diagnosis not present

## 2020-04-09 DIAGNOSIS — Y999 Unspecified external cause status: Secondary | ICD-10-CM | POA: Diagnosis not present

## 2020-04-09 DIAGNOSIS — I739 Peripheral vascular disease, unspecified: Secondary | ICD-10-CM | POA: Insufficient documentation

## 2020-04-09 DIAGNOSIS — Y906 Blood alcohol level of 120-199 mg/100 ml: Secondary | ICD-10-CM | POA: Diagnosis not present

## 2020-04-09 DIAGNOSIS — Y9389 Activity, other specified: Secondary | ICD-10-CM | POA: Diagnosis not present

## 2020-04-09 DIAGNOSIS — E1140 Type 2 diabetes mellitus with diabetic neuropathy, unspecified: Secondary | ICD-10-CM | POA: Insufficient documentation

## 2020-04-09 DIAGNOSIS — S0990XA Unspecified injury of head, initial encounter: Secondary | ICD-10-CM | POA: Diagnosis present

## 2020-04-09 DIAGNOSIS — S61214A Laceration without foreign body of right ring finger without damage to nail, initial encounter: Secondary | ICD-10-CM | POA: Insufficient documentation

## 2020-04-09 DIAGNOSIS — F10120 Alcohol abuse with intoxication, uncomplicated: Secondary | ICD-10-CM | POA: Diagnosis not present

## 2020-04-09 DIAGNOSIS — F10920 Alcohol use, unspecified with intoxication, uncomplicated: Secondary | ICD-10-CM

## 2020-04-09 LAB — CBC WITH DIFFERENTIAL/PLATELET
Abs Immature Granulocytes: 0.04 10*3/uL (ref 0.00–0.07)
Basophils Absolute: 0.1 10*3/uL (ref 0.0–0.1)
Basophils Relative: 1 %
Eosinophils Absolute: 0.3 10*3/uL (ref 0.0–0.5)
Eosinophils Relative: 6 %
HCT: 27.7 % — ABNORMAL LOW (ref 39.0–52.0)
Hemoglobin: 8.5 g/dL — ABNORMAL LOW (ref 13.0–17.0)
Immature Granulocytes: 1 %
Lymphocytes Relative: 29 %
Lymphs Abs: 1.5 10*3/uL (ref 0.7–4.0)
MCH: 26.2 pg (ref 26.0–34.0)
MCHC: 30.7 g/dL (ref 30.0–36.0)
MCV: 85.5 fL (ref 80.0–100.0)
Monocytes Absolute: 0.3 10*3/uL (ref 0.1–1.0)
Monocytes Relative: 7 %
Neutro Abs: 2.9 10*3/uL (ref 1.7–7.7)
Neutrophils Relative %: 56 %
Platelets: 173 10*3/uL (ref 150–400)
RBC: 3.24 MIL/uL — ABNORMAL LOW (ref 4.22–5.81)
RDW: 18.5 % — ABNORMAL HIGH (ref 11.5–15.5)
WBC: 5.1 10*3/uL (ref 4.0–10.5)
nRBC: 0 % (ref 0.0–0.2)

## 2020-04-09 LAB — COMPREHENSIVE METABOLIC PANEL
ALT: 9 U/L (ref 0–44)
AST: 14 U/L — ABNORMAL LOW (ref 15–41)
Albumin: 3 g/dL — ABNORMAL LOW (ref 3.5–5.0)
Alkaline Phosphatase: 105 U/L (ref 38–126)
Anion gap: 11 (ref 5–15)
BUN: 21 mg/dL — ABNORMAL HIGH (ref 6–20)
CO2: 21 mmol/L — ABNORMAL LOW (ref 22–32)
Calcium: 8.7 mg/dL — ABNORMAL LOW (ref 8.9–10.3)
Chloride: 104 mmol/L (ref 98–111)
Creatinine, Ser: 1.06 mg/dL (ref 0.61–1.24)
GFR calc Af Amer: >60 mL/min (ref >60)
GFR calc non Af Amer: >60 mL/min (ref >60)
Glucose, Bld: 80 mg/dL (ref 70–99)
Potassium: 3.9 mmol/L (ref 3.5–5.1)
Sodium: 136 mmol/L (ref 135–145)
Total Bilirubin: 0.3 mg/dL (ref 0.3–1.2)
Total Protein: 7.9 g/dL (ref 6.5–8.1)

## 2020-04-09 LAB — ETHANOL: Alcohol, Ethyl (B): 177 mg/dL — ABNORMAL HIGH (ref <10)

## 2020-04-09 MED ORDER — TETANUS-DIPHTH-ACELL PERTUSSIS 5-2.5-18.5 LF-MCG/0.5 IM SUSP
0.5000 mL | Freq: Once | INTRAMUSCULAR | Status: AC
  Administered 2020-04-09: 0.5 mL via INTRAMUSCULAR
  Filled 2020-04-09: qty 0.5

## 2020-04-09 MED ORDER — LIDOCAINE HCL (PF) 2 % IJ SOLN
10.0000 mL | Freq: Once | INTRAMUSCULAR | Status: AC
  Administered 2020-04-09: 10 mL
  Filled 2020-04-09: qty 10

## 2020-04-09 NOTE — Discharge Instructions (Signed)
Keep area clean by washing with soap and water daily. Do not submerge in water or scrub stitches Apply a bandage at least once daily, change more often if it is dirty Watch for signs of infection (redness, drainage, worsening pain) Take Tylenol or Ibuprofen for pain as needed Have stitches removed in 2 weeks

## 2020-04-09 NOTE — ED Triage Notes (Signed)
Laceration to right ring finger. States he does not remember cutting finger. States he is an alcoholic and has been drinking since early am

## 2020-04-09 NOTE — ED Provider Notes (Signed)
[facility] EMERGENCY DEPARTMENT Provider Note   CSN: [id number] Arrival date & time: 04/09/20  1559     History Chief Complaint  Patient presents with  . Laceration  . Alcohol Intoxication    Eugene Diaz is a 54 y.o. male with history of ETOH abuse who presents with a head injury and finger laceration. History is limited due to patient being acutely intoxicated. He states that he only drank "a little" today. He states he was throwing darts and fell and hit his head. This is why he came to the ED. In triage the nurses noted that he cut his right ring finger as well. The patient does not recall this happening and states that due to his neuropathy he can't feel in his hands and feet. He is s/p L foot amputation and is being followed by [name] for this.   LEVEL 5 caveat due to intoxication   HPI     Past Medical History:  Diagnosis Date  . Alcohol abuse   . Alcoholic peripheral neuropathy ([place])   . Anxiety   . Arthritis   . Asthma    followed by pcp  . B12 deficiency   . Depression   . Diabetic neuropathy ([place])   . Edema of both lower extremities   . Essential tremor    neurologist-- dr patel--- due to alcohol abuse  . GERD (gastroesophageal reflux disease)   . History of cellulitis 2020   left lower leg  . History of esophageal stricture    s/p  dilatation 02-13-2018  . History of osteomyelitis 2020   left great toe    . Hypercholesterolemia   . Hypertension    followed by pcp  (nuclear stress test 03-11-2014 low risk w/ no ischemia, ef 65%  . Normocytic anemia    followed by pcp   (03-18-2020 had transfusion's 02/ 2021)  . Peripheral vascular disease ([place])   . 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 Thrombocytopenia (HCC)    chronic  . Type 2 diabetes mellitus ([place])    followed by pcp---    (03-18-2020  pt stated does not check blood sugar)    Patient Active Problem List   Diagnosis Date Noted  . Acute blood loss anemia 01/04/2020  . Asthma   . Depression with anxiety   . Osteomyelitis of ankle or foot, acute, left ([place]) 12/17/2019  . Hyperglycemia 12/17/2019  . Type 2 diabetes mellitus with foot ulcer ([place])   . Anemia, chronic disease 11/10/2019  . Acute osteomyelitis of left foot ([place]) 11/10/2019  . Marijuana use 11/10/2019  . Wound dehiscence 08/31/2019  . Osteomyelitis due to type 2 diabetes mellitus ([place]) 08/14/2019  . Acute osteomyelitis of toe of left foot ([place]) 08/14/2019  . Sepsis ([place]) 08/14/2019  . Hyponatremia 08/14/2019  . Normocytic anemia 08/14/2019  . B12 deficiency 08/14/2019  . Osteomyelitis of foot ([place])   . Cellulitis of toe, left   . Diabetic infection of left foot ([place])   . Toe fracture, left 07/13/2019  . Cellulitis 07/12/2019  . GERD (gastroesophageal reflux disease) 12/19/2018  . Hepatic steatosis 06/18/2018  . Gastritis, erosive   . Dysphagia, idiopathic 12/27/2017  .  Rectal bleeding 12/27/2017  . Intention tremor 06/03/2016  . Left knee pain 12/04/2015  . Alcoholic peripheral neuropathy ([place]) 10/08/2014  . Diabetic neuropathy ([place]) 10/08/2014  . Chest pain, rule out acute myocardial infarction 03/10/2014  . Thrombocytopenia ([place]) 03/10/2014  . ETOH abuse 03/10/2014  . Chest pain 03/10/2014  . Diabetes mellitus without complication ([place])   . Hypertension associated with type 2 diabetes mellitus ([place])   . Hyperlipidemia associated with type 2 diabetes mellitus [facility])     Past Surgical History:  Procedure Laterality Date  . AMPUTATION Left 10/16/2019   Procedure: Left partial second ray resection; placement of antibiotic beads;  Surgeon: Evelina Bucy, DPM;  Location: [facility];  Service: Podiatry;  Laterality: Left;  .  AMPUTATION Left 11/13/2019   Procedure: Left Midfoot Amputation - Transmetatarsal vs. Lisfranc; Placement antibiotic beads;  Surgeon: Evelina Bucy, DPM;  Location: [facility];  Service: Podiatry;  Laterality: Left;  . AMPUTATION Left 12/21/2019   Procedure: Chopart Amputation left foot;  Surgeon: Evelina Bucy, DPM;  Location: [place];  Service: Podiatry;  Laterality: Left;  . AMPUTATION TOE Left 08/14/2019   Procedure: AMPUTATION GREAT TOE;  Surgeon: Evelina Bucy, DPM;  [facility];  Service: Podiatry;  Laterality: Left;  . APPLICATION OF WOUND VAC Left 12/21/2019   Procedure: Application Of Wound Vac;  Surgeon: Evelina Bucy, DPM;  Location: [place];  Service: Podiatry;  Laterality: Left;  . BIOPSY  02/13/2018   Procedure: BIOPSY;  Surgeon: Danie Binder, MD;  [facility];  Service: Endoscopy;;  transverse colon biopsy, gastric biopsy  . COLONOSCOPY WITH PROPOFOL N/A 02/13/2018   Procedure: COLONOSCOPY WITH PROPOFOL;  Surgeon: Danie Binder, MD;  [facility];  Service: Endoscopy;  Laterality: N/A;  11:15am  . ESOPHAGOGASTRODUODENOSCOPY (EGD) WITH PROPOFOL N/A 02/13/2018   Procedure: ESOPHAGOGASTRODUODENOSCOPY (EGD) WITH PROPOFOL;  Surgeon: Danie Binder, MD;  [facility];  Service: Endoscopy;  Laterality: N/A;  . I & D EXTREMITY Left 07/16/2019   Procedure: Insicion  AND DEBRIDEMENT EXTREMITY;  Surgeon: Evelina Bucy, DPM;  Location: [place];  Service: Podiatry;  Laterality: Left;  . INCISION AND DRAINAGE OF WOUND Left 10/13/2019   Procedure: IRRIGATION AND DEBRIDEMENT WOUND OF LEFT FOOT AND FIRST METATARSAL RESECTION;  Surgeon: Evelina Bucy, DPM;  [facility];  Service: Podiatry;  Laterality: Left;  . IRRIGATION AND DEBRIDEMENT FOOT Left 11/11/2019   Procedure: Left Foot Wound Irrigation and Debridement;  Surgeon: Evelina Bucy, DPM;  [facility];  Service: Podiatry;  Laterality: Left;  . Left arm     Left arm repair (tendon and  artery)  . PERCUTANEOUS PINNING  07/16/2019   Procedure: Open Reduction Percutaneous Pinning Extremity;  Surgeon: Evelina Bucy, DPM;  Location: [place];  Service: Podiatry;;  . PILONIDAL CYST EXCISION N/A 08/08/2014   Procedure: CYST EXCISION PILONIDAL EXTENSIVE;  Surgeon: Jamesetta So, MD;  [facility];  Service: General;  Laterality: N/A;  . POLYPECTOMY  02/13/2018   Procedure: POLYPECTOMY;  Surgeon: Danie Binder, MD;  [facility];  Service: Endoscopy;;  transverse colon polyp hs, rectal polyps times 2  . SAVORY DILATION N/A 02/13/2018   Procedure: SAVORY DILATION;  Surgeon: Danie Binder, MD;  [facility];  Service: Endoscopy;  Laterality: N/A;  . WOUND DEBRIDEMENT Left 09/04/2019   Procedure: DEBRIDEMENT WOUND WITH COMPLEX  REPAIR OF DEHISCENCE;  Surgeon: Evelina Bucy, DPM;  Location: [place];  Service: Podiatry;  Laterality: Left;  Leave patient on stretcher  . WOUND DEBRIDEMENT Left 10/16/2019   Procedure: Left foot wound debridement and closure;  Surgeon: Evelina Bucy, DPM;  [facility];  Service: Podiatry;  Laterality: Left;  . WOUND DEBRIDEMENT Left 12/18/2019   Procedure: LEFT FOOT DEBRIDEMENT WITH PARTIAL INCISION OF INFECTED BONE;  Surgeon: Evelina Bucy, DPM;  Location: [place];  Service: Podiatry;  Laterality: Left;  LEFT FOOT DEBRIDEMENT WITH PARTIAL INCISION OF INFECTED BONE       Family History  Problem Relation Age of Onset  . Heart attack Mother        Living, 14  . Cancer Father        Deceased  . Healthy Brother   . Healthy Sister   . Colon cancer Neg Hx   . Colon polyps Neg Hx     Social History   Tobacco Use  . Smoking status: Former Smoker    Packs/day: 2.50    Years: 25.00    Pack years: 62.50    Types: Cigarettes    Quit date: 08/05/1984    Years since quitting: 35.7  . Smokeless tobacco: Never Used  Vaping Use  . Vaping Use: Never used  Substance Use Topics  . Alcohol use: Yes     Alcohol/week: 50.0 standard drinks    Types: 50 Standard drinks or equivalent per week    Comment: Drinks 6 beers, liquor 4-6oz (both daily) x 35 years  . Drug use: Not Currently    Types: Marijuana    Comment: 03-18-2020  per pt none since 11/ 2020    Home Medications Prior to Admission medications   Medication Sig Start Date End Date Taking? Authorizing Provider  acetaminophen (TYLENOL) 325 MG tablet Take 2 tablets (650 mg total) by mouth every 6 (six) hours as needed for mild pain (or Fever >/= 101). 12/27/19   Arrien, [name], MD  albuterol (PROVENTIL HFA;VENTOLIN HFA) 108 (90 Base) MCG/ACT inhaler Inhale 2 puffs into the lungs every 6 (six) hours as needed for wheezing or shortness of breath. 10/31/18   Hayden Rasmussen, MD  ALPRAZolam Duanne Moron) 1 MG tablet Take 0.5 tablets (0.5 mg total) by mouth 4 (four) times daily as needed for anxiety. 01/21/20   Caren Griffins, MD  amitriptyline (ELAVIL) 100 MG tablet Take 100 mg by mouth at bedtime.    [provider]  atorvastatin (LIPITOR) 40 MG tablet Take 40 mg by mouth every evening.  12/17/15   [provider]  diclofenac Sodium (VOLTAREN) 1 % GEL Apply 1 application topically daily as needed (pain).    [provider]  docusate sodium (COLACE) 100 MG capsule Take 100 mg by mouth daily as needed for mild constipation.    [provider]  doxycycline (VIBRA-TABS) 100 MG tablet Take 1 tablet (100 mg total) by mouth 2 (two) times daily. 03/25/20   Evelina Bucy, DPM  DULoxetine (CYMBALTA) 60 MG capsule Take 60 mg by mouth daily. 11/18/19   [provider]  ferrous sulfate 325 (65 FE) MG tablet Take 1 tablet (325 mg total) by mouth daily. Patient taking differently: Take 325 mg by mouth daily.  11/15/19 11/14/20  Shelly Coss, MD  gabapentin (NEURONTIN) 300 MG capsule Take 900mg  in the morning and at noon, then take 1200mg  at bedtime Patient taking differently: Take 900-1,200 mg by mouth See  admin instructions. Take 3 capsules (900 mg) by mouth  every morning and at noon, take 4 capsules (1200 mg) at bedtime 06/14/19   Narda Amber K, DO  glimepiride (AMARYL) 4 MG tablet Take 4 mg by mouth daily.  08/10/19   [provider]  glycopyrrolate (ROBINUL) 1 MG tablet Take 1 mg by mouth 2 (two) times daily.  12/17/15   [provider]  ibuprofen (ADVIL) 400 MG tablet Take 1 tablet (400 mg total) by mouth every 8 (eight) hours as needed for moderate pain. 12/27/19   Arrien, [name], MD  lisinopril (PRINIVIL,ZESTRIL) 20 MG tablet Take 20 mg by mouth daily.  12/30/15   [provider]  omeprazole (PRILOSEC) 20 MG capsule TAKE 1 CAPSULE WITH BREAKFAST AND SUPPER. Patient taking differently: Take 20 mg by mouth 2 (two) times daily before a meal.  11/19/19   Mahala Menghini, PA-C  primidone (MYSOLINE) 50 MG tablet Take 1 tablet (50 mg total) by mouth 2 (two) times daily. 06/14/19   Narda Amber K, DO    Allergies    Patient has no known allergies.  Review of Systems   Review of Systems  Unable to perform ROS: Other (intoxicated)    Physical Exam Updated Vital Signs BP 129/72   Pulse 94   Temp 98.4 F (36.9 C)   Resp 20   SpO2 94%   Physical Exam Vitals and nursing note reviewed.  Constitutional:      General: He is not in acute distress.    Appearance: He is well-developed. He is obese. He is not ill-appearing.     Comments: Chronically ill appearing. Intoxicated but cooperative  HENT:     Head: Normocephalic.     Comments: Abrasion over the right side of the forehead Eyes:     General: No scleral icterus.       Right eye: No discharge.        Left eye: No discharge.     Conjunctiva/sclera: Conjunctivae normal.     Pupils: Pupils are equal, round, and reactive to light.  Cardiovascular:     Rate and Rhythm: Normal rate and regular rhythm.  Pulmonary:     Effort: Pulmonary effort is normal. No respiratory distress.     Breath sounds: Normal  breath sounds.  Abdominal:     General: There is no distension.  Musculoskeletal:     Cervical back: Normal range of motion.     Comments: ~3cm V shaped laceration over the right distal ring finger  Smaller ~1cm superficial laceration over the right middle finger  S/p amputation of the L foot. There are chronic venous stasis changes of the LLE with associated edema  Skin:    General: Skin is warm and dry.  Neurological:     Mental Status: He is alert and oriented to person, place, and time.  Psychiatric:        Behavior: Behavior normal.     ED Results / Procedures / Treatments   Labs (all labs ordered are listed, but only abnormal results are displayed) Labs Reviewed  COMPREHENSIVE METABOLIC PANEL - Abnormal; Notable for the following components:      Result Value   CO2 21 (*)    BUN 21 (*)    Calcium 8.7 (*)    Albumin 3.0 (*)    AST 14 (*)    All other components within normal limits  CBC WITH DIFFERENTIAL/PLATELET - Abnormal; Notable for the following components:   RBC 3.24 (*)    Hemoglobin 8.5 (*)    HCT  27.7 (*)    RDW 18.5 (*)    All other components within normal limits  ETHANOL - Abnormal; Notable for the following components:   Alcohol, Ethyl (B) 177 (*)    All other components within normal limits    EKG None  Radiology CT Head Wo Contrast  Result Date: 04/09/2020 CLINICAL DATA:  Status post trauma. EXAM: CT HEAD WITHOUT CONTRAST TECHNIQUE: Contiguous axial images were obtained from the base of the skull through the vertex without intravenous contrast. COMPARISON:  October 09, 2008 FINDINGS: Brain: There is mild cerebral atrophy with widening of the extra-axial spaces and ventricular dilatation. There are areas of decreased attenuation within the white matter tracts of the supratentorial brain, consistent with microvascular disease changes. Vascular: No hyperdense vessel or unexpected calcification. Skull: Normal. Negative for fracture or focal lesion.  Sinuses/Orbits: No acute finding. Other: There is mild right frontal scalp soft tissue swelling. IMPRESSION: Mild right frontal scalp soft tissue swelling without evidence of an acute fracture or acute intracranial abnormality. Electronically Signed   By: Virgina Norfolk M.D.   On: 04/09/2020 18:33    Procedures .Marland KitchenLaceration Repair  Date/Time: 04/10/2020 1:17 PM Performed by: Recardo Evangelist, PA-C Authorized by: Recardo Evangelist, PA-C   Consent:    Consent obtained:  Verbal   Consent given by:  Patient   Risks discussed:  Infection and pain   Alternatives discussed:  No treatment Anesthesia (see MAR for exact dosages):    Anesthesia method:  Local infiltration   Local anesthetic:  Lidocaine 2% w/o epi Laceration details:    Location:  Finger   Finger location:  R ring finger   Length (cm):  3   Depth (mm):  10 Repair type:    Repair type:  Simple Pre-procedure details:    Preparation:  Patient was prepped and draped in usual sterile fashion Exploration:    Wound exploration: wound explored through full range of motion and entire depth of wound probed and visualized     Wound extent: no nerve damage noted, no tendon damage noted and no underlying fracture noted   Treatment:    Area cleansed with:  Saline   Amount of cleaning:  Standard   Irrigation method:  Tap   Visualized foreign bodies/material removed: no   Skin repair:    Repair method:  Sutures   Suture size:  5-0   Suture material:  Nylon   Suture technique:  Simple interrupted   Number of sutures:  8 Approximation:    Approximation:  Close Post-procedure details:    Dressing:  Antibiotic ointment and sterile dressing   Patient tolerance of procedure:  Tolerated well, no immediate complications   (including critical care time)    Medications Ordered in ED Medications  Tdap (BOOSTRIX) injection 0.5 mL (0.5 mLs Intramuscular Given 04/09/20 1919)  lidocaine HCl (PF) (XYLOCAINE) 2 % injection 10 mL (10 mLs  Other Given 04/09/20 1920)    ED Course  I have reviewed the triage vital signs and the nursing notes.  Pertinent labs & imaging results that were available during my care of the patient were reviewed by me and considered in my medical decision making (see chart for details).  54 year old male presents with alcohol intoxication, a fall and head injury, and finger laceration.  Patient is clinically intoxicated limiting history.  He is hypertensive but otherwise vital signs are reassuring.  On exam he is calm and cooperative with slurred speech.  He has no obvious head injury.  He also has a laceration over the ring and middle finger.  Will obtain labs, CT head.  Tetanus was updated  CBC shows anemia which is around his baseline.  CMP shows mildly low bicarb, low calcium, low protein consistent with chronic alcohol abuse.  EtOH level is elevated.  CT head is negative.  Wound on his ring finger was copiously irrigated and 8 stitches were placed.  He has a smaller wound on the middle finger which just a nonadherent dressing was applied.  Patient was advised to have the stitches removed in 2 weeks.  He was discharged home with his mother who is picking him up.  MDM Rules/Calculators/A&P                           Final Clinical Impression(s) / ED Diagnoses Final diagnoses:  Alcoholic intoxication without complication ([place])  Laceration of right ring finger, foreign body presence unspecified, nail damage status unspecified, initial encounter  Injury of head, initial encounter    Rx / DC Orders ED Discharge Orders    None       Recardo Evangelist, PA-C 04/10/20 1322    Fredia Sorrow, MD 04/20/20 1851

## 2020-04-10 ENCOUNTER — Encounter: Payer: Medicaid Other | Admitting: Podiatry

## 2020-04-15 NOTE — Progress Notes (Signed)
h and p  Update signed by ben mann pa for 04-22-2020 surgery received and placed on patient chart

## 2020-04-15 NOTE — Progress Notes (Signed)
Spoke with shelley at dr price and made aware pt needs addendum to 03-24-2020 h&P note from ben mann ok for 04-22-2020 surgery.

## 2020-04-16 ENCOUNTER — Encounter (HOSPITAL_BASED_OUTPATIENT_CLINIC_OR_DEPARTMENT_OTHER): Payer: Self-pay | Admitting: Podiatry

## 2020-04-16 ENCOUNTER — Other Ambulatory Visit: Payer: Self-pay

## 2020-04-16 NOTE — Progress Notes (Addendum)
Spoke w/ via phone for pre-op interview--- PT Lab needs dos---- [place]               Lab results------ current ekg dated 01-17-2020 in epic/ chart COVID test ------ 04-18-2020 @ 1205 Arrive at ------- 0645 NPO after ------ [place] Medications to take morning of surgery ----- Gabapentin, Cymbalta w/ sips of water Diabetic medication ----- do not take amaryl morning of surgery Patient Special Instructions ----- ask to bring rescue inhaler dos Pre-Op special Istructions ----- pt's Jeri Cos PA H&P dated 03-24-2020 with addendum updated signed 04-15-2020 is with chart. This pt was previously on 03-25-2020 pt was cancelled in pre-op dos due to cellulitis. Pt was asked to bring his medication list with him dos to confirm his list in epic and will be asked last dose of each med dos.   Patient verbalized understanding of instructions that were given at this phone interview. Patient denies shortness of breath, chest pain, fever, cough a this phone interview.   Anesthesia : hx HTN, DM2, alcohol abuse;  Pt stated he cellulitis of left lower leg is better, dx 03-25-2020 day of previous surgery that was cancelled and rescheduled.  PCP: Collene Mares PA (per pt lov 03-24-2020) Cardiologist : no Chest x-ray : 10-31-2018 epic EKG : 01-17-2020 epic Echo : 03-11-2014 epic Stress test:  Nuclear 03-11-2014 epic Cardiac Cath :  no Sleep Study/ CPAP : NO Fasting Blood Sugar :      / Checks Blood Sugar -- times a day:   Pt does not check Blood Thinner/ Instructions Maryjane Hurter Dose: NO ASA / Instructions/ Last Dose :  NO

## 2020-04-18 ENCOUNTER — Other Ambulatory Visit (HOSPITAL_COMMUNITY): Payer: Medicaid Other

## 2020-04-20 ENCOUNTER — Telehealth: Payer: Self-pay | Admitting: *Deleted

## 2020-04-20 ENCOUNTER — Other Ambulatory Visit: Payer: Self-pay

## 2020-04-20 ENCOUNTER — Ambulatory Visit (INDEPENDENT_AMBULATORY_CARE_PROVIDER_SITE_OTHER): Payer: Medicaid Other | Admitting: Podiatry

## 2020-04-20 VITALS — Temp 96.7°F

## 2020-04-20 DIAGNOSIS — M86672 Other chronic osteomyelitis, left ankle and foot: Secondary | ICD-10-CM | POA: Diagnosis not present

## 2020-04-20 DIAGNOSIS — S80822A Blister (nonthermal), left lower leg, initial encounter: Secondary | ICD-10-CM

## 2020-04-20 NOTE — Telephone Encounter (Signed)
Pt called states his leg is red like it was the last time he didn't get to have surgery. I offered pt an appt and he accepted he is to see Dr. Boneta Lucks today at 3:00pm.

## 2020-04-20 NOTE — Progress Notes (Signed)
Spoke with patient by phone and patient to call dr price, he thinks area is infected and will call back if having surgery to scheule covid test for 04-21-2020.

## 2020-04-21 ENCOUNTER — Encounter: Payer: Self-pay | Admitting: Podiatry

## 2020-04-21 ENCOUNTER — Other Ambulatory Visit (HOSPITAL_COMMUNITY)
Admission: RE | Admit: 2020-04-21 | Discharge: 2020-04-21 | Disposition: A | Discharge status: Home / Self Care | Payer: Medicaid Other | Source: Ambulatory Visit | Attending: Podiatry | Admitting: Podiatry

## 2020-04-21 DIAGNOSIS — Z01812 Encounter for preprocedural laboratory examination: Secondary | ICD-10-CM | POA: Insufficient documentation

## 2020-04-21 DIAGNOSIS — Z20822 Contact with and (suspected) exposure to covid-19: Secondary | ICD-10-CM | POA: Diagnosis not present

## 2020-04-21 LAB — SARS CORONAVIRUS 2 (TAT 6-24 HRS): SARS Coronavirus 2: NEGATIVE

## 2020-04-21 NOTE — Progress Notes (Signed)
Subjective:  Patient ID: Eugene Diaz, male    DOB: 01/29/1966,  MRN: [id number]  Chief Complaint  Patient presents with  . Follow-up    L foot. Pt has been seen recently by [name]. Pt stated, "He gave me antibiotics, which started to clear it up. I feel like it's red and warm again. No fever/chills/N&V/foul odor. I'm not sure about this surgery".    54 y.o. male presents with the above complaint.  Patient presents with status post left Chopart amp done in February.  Patient states is doing well however he was concerned for possible infection.  He saw some pinkish skin color and was worried that this might be infection.  He denies any other acute complaints.  He has been using his crutches.  He also wanted to discuss more about surgery for Achilles tendon lengthening next week with [name].  I instructed him to talk with him directly.  He denies any other acute complaints.   Review of Systems: Negative except as noted in the HPI. Denies N/V/F/Ch.  Past Medical History:  Diagnosis Date  . Alcohol abuse   . Alcoholic peripheral neuropathy ([place])   . Anxiety   . Arthritis   . Asthma    followed by pcp  . B12 deficiency   . Depression   . Diabetic neuropathy ([place])   . Edema of both lower extremities   . Essential tremor    neurologist-- dr Gabryella Murfin--- due to alcohol abuse  . GERD (gastroesophageal reflux disease)   . History of cellulitis 2020   left lower leg;   recurrent 03-25-2020  . History of esophageal stricture    s/p  dilatation 02-13-2018  . History of osteomyelitis 2020   left great toe    . Hypercholesterolemia   . Hypertension    followed by pcp  (nuclear stress test 03-11-2014 low risk w/ no ischemia, ef 65%  . Normocytic anemia    followed by pcp   (03-18-2020 had transfusion's 02/ 2021)  . Peripheral vascular disease ([place])   . 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 Thrombocytopenia (HCC)    chronic  . Type 2 diabetes mellitus ([place])    followed by pcp---    (03-18-2020  pt stated does not check blood sugar)    Current Outpatient Medications:  .  acetaminophen (TYLENOL) 325 MG tablet, Take 2 tablets (650 mg total) by mouth every 6 (six) hours as needed for mild pain (or Fever >/= 101)., Disp: 20 tablet, Rfl: 0 .  albuterol (PROVENTIL HFA;VENTOLIN HFA) 108 (90 Base) MCG/ACT inhaler, Inhale 2 puffs into the lungs every 6 (six) hours as needed for wheezing or shortness of breath., Disp: 1 Inhaler, Rfl: 2 .  ALPRAZolam (XANAX) 1 MG tablet, Take 0.5 tablets (0.5 mg total) by mouth 4 (four) times daily as needed for anxiety., Disp: 12 tablet, Rfl: 0 .  amitriptyline (ELAVIL) 100 MG tablet, Take 100 mg by mouth at bedtime., Disp: , Rfl:  .  atorvastatin (LIPITOR) 40 MG tablet, Take 40 mg by mouth every evening. , Disp: , Rfl: 10 .  diclofenac Sodium (VOLTAREN) 1 % GEL, Apply 1 application topically daily as needed (pain).,  Disp: , Rfl:  .  docusate sodium (COLACE) 100 MG capsule, Take 100 mg by mouth daily as needed for mild constipation., Disp: , Rfl:  .  DULoxetine (CYMBALTA) 60 MG capsule, Take 60 mg by mouth daily., Disp: , Rfl:  .  ferrous sulfate 325 (65 FE) MG tablet, Take 1 tablet (325 mg total) by mouth daily. (Patient taking differently: Take 325 mg by mouth daily. ), Disp: 30 tablet, Rfl: 1 .  gabapentin (NEURONTIN) 300 MG capsule, Take 900mg  in the morning and at noon, then take 1200mg  at bedtime (Patient taking differently: Take 900-1,200 mg by mouth See admin instructions. Take 3 capsules (900 mg) by mouth every morning and at noon, take 4 capsules (1200 mg) at bedtime), Disp: 300 capsule, Rfl: 11 .  glimepiride (AMARYL) 4 MG tablet, Take 4 mg by mouth daily. , Disp: , Rfl:  .   glycopyrrolate (ROBINUL) 1 MG tablet, Take 1 mg by mouth 2 (two) times daily. , Disp: , Rfl: 2 .  ibuprofen (ADVIL) 400 MG tablet, Take 1 tablet (400 mg total) by mouth every 8 (eight) hours as needed for moderate pain., Disp: 30 tablet, Rfl: 0 .  lisinopril (PRINIVIL,ZESTRIL) 20 MG tablet, Take 20 mg by mouth daily. , Disp: , Rfl: 4 .  omeprazole (PRILOSEC) 20 MG capsule, TAKE 1 CAPSULE WITH BREAKFAST AND SUPPER. (Patient taking differently: Take 20 mg by mouth 2 (two) times daily before a meal. ), Disp: 60 capsule, Rfl: 5 .  primidone (MYSOLINE) 50 MG tablet, Take 1 tablet (50 mg total) by mouth 2 (two) times daily., Disp: 60 tablet, Rfl: 11  Social History   Tobacco Use  Smoking Status Former Smoker  . Packs/day: 2.50  . Years: 25.00  . Pack years: 62.50  . Types: Cigarettes  . Quit date: 08/05/1984  . Years since quitting: 35.7  Smokeless Tobacco Never Used    No Known Allergies Objective:   Vitals:   04/20/20 1523  Temp: (!) 96.7 F (35.9 C)   There is no height or weight on file to calculate BMI. Constitutional Well developed. Well nourished.  Vascular Dorsalis pedis pulses palpable bilaterally. Posterior tibial pulses palpable bilaterally. Capillary refill normal to all digits.  No cyanosis or clubbing noted. Pedal hair growth normal.  Neurologic Normal speech. Oriented to person, place, and time. Epicritic sensation to light touch grossly present bilaterally.  Dermatologic  left Chopart amputation site healing well.  Mild dehiscence noted at the incision site.  No clinical signs of infection noted.  Left lateral blister superficial in nature without the movement of the epidermal layer.  No serous drainage or any drainage present.  No purulent drainage noted.  No underlying wounds noted.  Orthopedic: Normal joint ROM without pain or crepitus bilaterally. No visible deformities. No bony tenderness.   Radiographs: None Assessment:   1. Blister of left lower extremity  without infection, initial encounter    Plan:  Patient was evaluated and treated and all questions answered.  Left lateral leg frictional blister -I explained to patient the etiology of frictional blister and various treatment options were discussed.  This is likely due to patient continuous ambulation with a cam boot.  The cam boot on the lateral side might be rubbing up against the foot.  I believe padding will help alleviate some of the abrasion associated with it.  Patient agrees with the plan would like to keep it nice and padded in the shoes. -Betadine dressing was applied. -Patient will follow up  with [name] next week.  No follow-ups on file.

## 2020-04-22 ENCOUNTER — Other Ambulatory Visit: Payer: Self-pay | Admitting: Podiatry

## 2020-04-22 ENCOUNTER — Encounter (HOSPITAL_BASED_OUTPATIENT_CLINIC_OR_DEPARTMENT_OTHER): Payer: Self-pay | Admitting: Certified Registered"

## 2020-04-22 ENCOUNTER — Encounter (HOSPITAL_BASED_OUTPATIENT_CLINIC_OR_DEPARTMENT_OTHER)
Admission: RE | Disposition: A | Discharge status: Home / Self Care | Payer: Self-pay | Source: Home / Self Care | Attending: Podiatry

## 2020-04-22 ENCOUNTER — Ambulatory Visit (HOSPITAL_BASED_OUTPATIENT_CLINIC_OR_DEPARTMENT_OTHER)
Admission: RE | Admit: 2020-04-22 | Discharge: 2020-04-22 | Disposition: A | Discharge status: Home / Self Care | Payer: Medicaid Other | Attending: Podiatry | Admitting: Podiatry

## 2020-04-22 SURGERY — LENGTHENING, TENDON
Anesthesia: Monitor Anesthesia Care

## 2020-04-22 MED ORDER — CLINDAMYCIN HCL 300 MG PO CAPS
300.0000 mg | ORAL_CAPSULE | Freq: Two times a day (BID) | ORAL | 0 refills | Status: DC
Start: 2020-04-22 — End: 2020-06-30

## 2020-04-22 SURGICAL SUPPLY — 57 items
APL PRP STRL LF DISP 70% ISPRP (MISCELLANEOUS)
BANDAGE ESMARK 6X9 LF (GAUZE/BANDAGES/DRESSINGS) ×1 IMPLANT
BLADE HEX COATED 2.75 (ELECTRODE) ×1 IMPLANT
BLADE SURG 15 STRL LF DISP TIS (BLADE) ×2 IMPLANT
BLADE SURG 15 STRL SS (BLADE)
BNDG CMPR 9X6 STRL LF SNTH (GAUZE/BANDAGES/DRESSINGS)
BNDG ELASTIC 3X5.8 VLCR STR LF (GAUZE/BANDAGES/DRESSINGS) ×1 IMPLANT
BNDG ELASTIC 4X5.8 VLCR STR LF (GAUZE/BANDAGES/DRESSINGS) IMPLANT
BNDG ESMARK 6X9 LF (GAUZE/BANDAGES/DRESSINGS)
BNDG GAUZE ELAST 4 BULKY (GAUZE/BANDAGES/DRESSINGS) ×1 IMPLANT
CHLORAPREP W/TINT 26 (MISCELLANEOUS) ×1 IMPLANT
COVER BACK TABLE 60X90IN (DRAPES) ×1 IMPLANT
COVER WAND RF STERILE (DRAPES) ×1 IMPLANT
CUFF TOURN SGL QUICK 18X4 (TOURNIQUET CUFF) IMPLANT
CUFF TOURN SGL QUICK 24 (TOURNIQUET CUFF)
CUFF TRNQT CYL 24X4X16.5-23 (TOURNIQUET CUFF) IMPLANT
DRAPE EXTREMITY T 121X128X90 (DISPOSABLE) ×1 IMPLANT
DRAPE U-SHAPE 47X51 STRL (DRAPES) ×1 IMPLANT
DRSG EMULSION OIL 3X3 NADH (GAUZE/BANDAGES/DRESSINGS) ×1 IMPLANT
DRSG PAD ABDOMINAL 8X10 ST (GAUZE/BANDAGES/DRESSINGS) IMPLANT
ELECT REM PT RETURN 9FT ADLT (ELECTROSURGICAL)
ELECTRODE REM PT RTRN 9FT ADLT (ELECTROSURGICAL) ×1 IMPLANT
GAUZE SPONGE 4X4 12PLY STRL (GAUZE/BANDAGES/DRESSINGS) ×1 IMPLANT
GAUZE SPONGE 4X4 12PLY STRL LF (GAUZE/BANDAGES/DRESSINGS) ×1 IMPLANT
GLOVE BIO SURGEON STRL SZ7.5 (GLOVE) ×1 IMPLANT
GLOVE BIOGEL PI IND STRL 8 (GLOVE) ×1 IMPLANT
GLOVE BIOGEL PI INDICATOR 8 (GLOVE)
GOWN STRL REUS W/ TWL XL LVL3 (GOWN DISPOSABLE) ×1 IMPLANT
GOWN STRL REUS W/TWL XL LVL3 (GOWN DISPOSABLE)
NDL HYPO 25X1 1.5 SAFETY (NEEDLE) IMPLANT
NEEDLE HYPO 25X1 1.5 SAFETY (NEEDLE) IMPLANT
NS IRRIG 1000ML POUR BTL (IV SOLUTION) IMPLANT
PAD CAST 4YDX4 CTTN HI CHSV (CAST SUPPLIES) IMPLANT
PADDING CAST ABS 4INX4YD NS (CAST SUPPLIES)
PADDING CAST ABS COTTON 4X4 ST (CAST SUPPLIES) ×1 IMPLANT
PADDING CAST COTTON 4X4 STRL (CAST SUPPLIES)
PENCIL SMOKE EVACUATOR (MISCELLANEOUS) ×1 IMPLANT
SET BASIN DAY SURGERY F.S. (CUSTOM PROCEDURE TRAY) ×1 IMPLANT
SLEEVE SCD COMPRESS KNEE MED (MISCELLANEOUS) ×1 IMPLANT
STOCKINETTE 6  STRL (DRAPES)
STOCKINETTE 6 STRL (DRAPES) ×1 IMPLANT
SUCTION FRAZIER HANDLE 10FR (MISCELLANEOUS)
SUCTION TUBE FRAZIER 10FR DISP (MISCELLANEOUS) IMPLANT
SUT ETHIBOND 2-0 V-5 NDL (SUTURE) ×1 IMPLANT
SUT ETHIBOND 2-0 V-5 NEEDLE (SUTURE) IMPLANT
SUT ETHILON 3 0 PS 1 (SUTURE) ×1 IMPLANT
SUT MERSILENE 4 0 P 3 (SUTURE) IMPLANT
SUT SILK 3 0 SH 30 (SUTURE) ×1 IMPLANT
SUT VIC AB 0 CT1 36 (SUTURE) IMPLANT
SUT VIC AB 2-0 SH 27 (SUTURE)
SUT VIC AB 2-0 SH 27XBRD (SUTURE) IMPLANT
SUT VIC AB 3-0 FS2 27 (SUTURE) ×1 IMPLANT
SUT VICRYL 4-0 PS2 18IN ABS (SUTURE) IMPLANT
SYR BULB EAR ULCER 3OZ GRN STR (SYRINGE) ×1 IMPLANT
SYR CONTROL 10ML LL (SYRINGE) IMPLANT
TOWEL OR 17X26 10 PK STRL BLUE (TOWEL DISPOSABLE) ×1 IMPLANT
UNDERPAD 30X36 HEAVY ABSORB (UNDERPADS AND DIAPERS) ×1 IMPLANT

## 2020-04-22 NOTE — Progress Notes (Signed)
Rx for clindamycin sent to pharmacy for recurrent cellulitis. Will cancel surgery and reschedule.

## 2020-04-30 ENCOUNTER — Encounter: Payer: Medicaid Other | Admitting: Podiatry

## 2020-05-01 ENCOUNTER — Encounter: Payer: Medicaid Other | Admitting: Podiatry

## 2020-05-08 ENCOUNTER — Encounter: Payer: Medicaid Other | Admitting: Podiatry

## 2020-05-13 NOTE — Progress Notes (Signed)
Subjective:  Patient ID: Eugene Diaz, male    DOB: 1966-06-03,  MRN: [id number]  No chief complaint on file.   54 y.o. male returns for post-op check.  Has been non-compliant with [facility] precatuions.  Review of Systems: Negative except as noted in the HPI. Denies N/V/F/Ch.  Past Medical History:  Diagnosis Date  . Alcohol abuse   . Alcoholic peripheral neuropathy ([place])   . Anxiety   . Arthritis   . Asthma    followed by pcp  . B12 deficiency   . Depression   . Diabetic neuropathy ([place])   . Edema of both lower extremities   . Essential tremor    neurologist-- dr patel--- due to alcohol abuse  . GERD (gastroesophageal reflux disease)   . History of cellulitis 2020   left lower leg;   recurrent 03-25-2020  . History of esophageal stricture    s/p  dilatation 02-13-2018  . History of osteomyelitis 2020   left great toe    . Hypercholesterolemia   . Hypertension    followed by pcp  (nuclear stress test 03-11-2014 low risk w/ no ischemia, ef 65%  . Normocytic anemia    followed by pcp   (03-18-2020 had transfusion's 02/ 2021)  . Peripheral vascular disease ([place])   . 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 Thrombocytopenia (HCC)    chronic  . Type 2 diabetes mellitus ([place])    followed by pcp---    (03-18-2020  pt stated does not check blood sugar)    Current Outpatient Medications:  .  acetaminophen (TYLENOL) 325 MG tablet, Take 2 tablets (650 mg total) by mouth every 6 (six) hours as needed for mild pain (or Fever >/= 101)., Disp: 20 tablet, Rfl: 0 .  albuterol (PROVENTIL HFA;VENTOLIN HFA) 108 (90 Base) MCG/ACT inhaler, Inhale 2 puffs into the lungs every 6 (six) hours as needed for wheezing or shortness of  breath., Disp: 1 Inhaler, Rfl: 2 .  ALPRAZolam (XANAX) 1 MG tablet, Take 0.5 tablets (0.5 mg total) by mouth 4 (four) times daily as needed for anxiety., Disp: 12 tablet, Rfl: 0 .  amitriptyline (ELAVIL) 100 MG tablet, Take 100 mg by mouth at bedtime., Disp: , Rfl:  .  atorvastatin (LIPITOR) 40 MG tablet, Take 40 mg by mouth every evening. , Disp: , Rfl: 10 .  clindamycin (CLEOCIN) 300 MG capsule, Take 1 capsule (300 mg total) by mouth 2 (two) times daily., Disp: 14 capsule, Rfl: 0 .  diclofenac Sodium (VOLTAREN) 1 % GEL, Apply 1 application topically daily as needed (pain)., Disp: , Rfl:  .  docusate sodium (COLACE) 100 MG capsule, Take 100 mg by mouth daily as needed for mild constipation., Disp: , Rfl:  .  DULoxetine (CYMBALTA) 60 MG capsule, Take 60 mg by mouth daily., Disp: , Rfl:  .  ferrous sulfate 325 (65 FE) MG tablet, Take 1 tablet (325 mg total) by mouth daily. (Patient taking differently: Take 325 mg by mouth daily. ), Disp: 30 tablet, Rfl: 1 .  gabapentin (NEURONTIN) 300 MG capsule, Take 900mg  in the morning and at noon, then take 1200mg  at bedtime (Patient taking differently: Take 900-1,200 mg by mouth See  admin instructions. Take 3 capsules (900 mg) by mouth every morning and at noon, take 4 capsules (1200 mg) at bedtime), Disp: 300 capsule, Rfl: 11 .  glimepiride (AMARYL) 4 MG tablet, Take 4 mg by mouth daily. , Disp: , Rfl:  .  glycopyrrolate (ROBINUL) 1 MG tablet, Take 1 mg by mouth 2 (two) times daily. , Disp: , Rfl: 2 .  ibuprofen (ADVIL) 400 MG tablet, Take 1 tablet (400 mg total) by mouth every 8 (eight) hours as needed for moderate pain., Disp: 30 tablet, Rfl: 0 .  lisinopril (PRINIVIL,ZESTRIL) 20 MG tablet, Take 20 mg by mouth daily. , Disp: , Rfl: 4 .  omeprazole (PRILOSEC) 20 MG capsule, TAKE 1 CAPSULE WITH BREAKFAST AND SUPPER. (Patient taking differently: Take 20 mg by mouth 2 (two) times daily before a meal. ), Disp: 60 capsule, Rfl: 5 .  primidone (MYSOLINE) 50 MG tablet,  Take 1 tablet (50 mg total) by mouth 2 (two) times daily., Disp: 60 tablet, Rfl: 11  Social History   Tobacco Use  Smoking Status Former Smoker  . Packs/day: 2.50  . Years: 25.00  . Pack years: 62.50  . Types: Cigarettes  . Quit date: 08/05/1984  . Years since quitting: 35.7  Smokeless Tobacco Never Used    No Known Allergies Objective:  There were no vitals filed for this visit. There is no height or weight on file to calculate BMI. Constitutional Well developed. Well nourished.  Vascular Foot warm and well perfused. Capillary refill normal to all digits.   Neurologic Normal speech. Oriented to person, place, and time. Epicritic sensation to light touch grossly present bilaterally.  Dermatologic Skin healing well without signs of infection. Skin edges well coapted without signs of infection.  Orthopedic:  Mild no clinical signs of infection noted.  No hematoma noted.  No active bleeding noted.  Tenderness to palpation noted about the surgical site.   Radiographs: None Assessment:   1. Post-operative state    Plan:  Patient was evaluated and treated and all questions answered.  S/p foot surgery left -Progressing as expected post-operatively. -XR: None -WB Status: Nonweightbearing to the left lower extremity -Sutures: Staples are intact without any signs of dehiscence.  No clinical signs of infection noted.  No active bleeding noted  -Medications: None -Foot redressed with betadine WTD.  Return in about 1 week (around 01/17/2020).

## 2020-05-13 NOTE — Progress Notes (Signed)
Subjective:  Patient ID: Eugene Diaz, male    DOB: 05/09/1966,  MRN: [id number]  Chief Complaint  Patient presents with  . Wound Check    Pt. states," doing great, much better." -pt denies N/V/F/Ch -w/ less redness and swelling -w/ more draiange    DOS: 07/16/2019 Procedure: Open reduction, perc pinning left hallux fracture, incision and drainage left hallux.  54 y.o. male returns for post-op check. Hx as above. Hx as above.  Review of Systems: Negative except as noted in the HPI. Denies N/V/F/Ch.  Past Medical History:  Diagnosis Date  . Alcohol abuse   . Alcoholic peripheral neuropathy ([place])   . Anxiety   . Arthritis   . Asthma    followed by pcp  . B12 deficiency   . Depression   . Diabetic neuropathy ([place])   . Edema of both lower extremities   . Essential tremor    neurologist-- dr patel--- due to alcohol abuse  . GERD (gastroesophageal reflux disease)   . History of cellulitis 2020   left lower leg;   recurrent 03-25-2020  . History of esophageal stricture    s/p  dilatation 02-13-2018  . History of osteomyelitis 2020   left great toe    . Hypercholesterolemia   . Hypertension    followed by pcp  (nuclear stress test 03-11-2014 low risk w/ no ischemia, ef 65%  . Normocytic anemia    followed by pcp   (03-18-2020 had transfusion's 02/ 2021)  . Peripheral vascular disease ([place])   . 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 Thrombocytopenia (HCC)    chronic  . Type 2 diabetes mellitus ([place])    followed by pcp---    (03-18-2020  pt stated does not check blood sugar)    Current Outpatient Medications:  .  acetaminophen (TYLENOL) 325 MG tablet, Take 2 tablets (650 mg total) by mouth every 6 (six)  hours as needed for mild pain (or Fever >/= 101)., Disp: 20 tablet, Rfl: 0 .  albuterol (PROVENTIL HFA;VENTOLIN HFA) 108 (90 Base) MCG/ACT inhaler, Inhale 2 puffs into the lungs every 6 (six) hours as needed for wheezing or shortness of breath., Disp: 1 Inhaler, Rfl: 2 .  ALPRAZolam (XANAX) 1 MG tablet, Take 0.5 tablets (0.5 mg total) by mouth 4 (four) times daily as needed for anxiety., Disp: 12 tablet, Rfl: 0 .  amitriptyline (ELAVIL) 100 MG tablet, Take 100 mg by mouth at bedtime., Disp: , Rfl:  .  atorvastatin (LIPITOR) 40 MG tablet, Take 40 mg by mouth every evening. , Disp: , Rfl: 10 .  clindamycin (CLEOCIN) 300 MG capsule, Take 1 capsule (300 mg total) by mouth 2 (two) times daily., Disp: 14 capsule, Rfl: 0 .  diclofenac Sodium (VOLTAREN) 1 % GEL, Apply 1 application topically daily as needed (pain)., Disp: , Rfl:  .  docusate sodium (COLACE) 100 MG capsule, Take 100 mg by mouth daily as needed for mild constipation., Disp: , Rfl:  .  DULoxetine (CYMBALTA) 60 MG capsule, Take 60 mg by mouth daily., Disp: , Rfl:  .  ferrous sulfate 325 (65 FE) MG tablet, Take 1 tablet (325 mg total) by mouth daily. (Patient taking differently:  Take 325 mg by mouth daily. ), Disp: 30 tablet, Rfl: 1 .  gabapentin (NEURONTIN) 300 MG capsule, Take 900mg  in the morning and at noon, then take 1200mg  at bedtime (Patient taking differently: Take 900-1,200 mg by mouth See admin instructions. Take 3 capsules (900 mg) by mouth every morning and at noon, take 4 capsules (1200 mg) at bedtime), Disp: 300 capsule, Rfl: 11 .  glimepiride (AMARYL) 4 MG tablet, Take 4 mg by mouth daily. , Disp: , Rfl:  .  glycopyrrolate (ROBINUL) 1 MG tablet, Take 1 mg by mouth 2 (two) times daily. , Disp: , Rfl: 2 .  ibuprofen (ADVIL) 400 MG tablet, Take 1 tablet (400 mg total) by mouth every 8 (eight) hours as needed for moderate pain., Disp: 30 tablet, Rfl: 0 .  lisinopril (PRINIVIL,ZESTRIL) 20 MG tablet, Take 20 mg by mouth daily. , Disp: ,  Rfl: 4 .  omeprazole (PRILOSEC) 20 MG capsule, TAKE 1 CAPSULE WITH BREAKFAST AND SUPPER. (Patient taking differently: Take 20 mg by mouth 2 (two) times daily before a meal. ), Disp: 60 capsule, Rfl: 5 .  primidone (MYSOLINE) 50 MG tablet, Take 1 tablet (50 mg total) by mouth 2 (two) times daily., Disp: 60 tablet, Rfl: 11  Social History   Tobacco Use  Smoking Status Former Smoker  . Packs/day: 2.50  . Years: 25.00  . Pack years: 62.50  . Types: Cigarettes  . Quit date: 08/05/1984  . Years since quitting: 35.7  Smokeless Tobacco Never Used    No Known Allergies Objective:  101.5, 108/71, 108 There is no height or weight on file to calculate BMI. Constitutional Well developed. Well nourished.  Vascular Foot warm and well perfused. Capillary refill normal to all digits.   Neurologic Normal speech. Oriented to person, place, and time. Epicritic sensation to light touch grossly present bilaterally.  Dermatologic Skin healing well without signs of infection.  Slight fibrosis of the medial aspect of the toe but staples intact. Local warmth and edema of the digit with purulence of the toe noted. Pin loose.   Orthopedic:  No tenderness to palpation noted about the surgical site.   Radiographs: Taken and reviewed. Evidence of osteomyelitis of the great toe Assessment:   1. Cellulitis and abscess of toe of left foot   2. Closed displaced fracture of proximal phalanx of left great toe, initial encounter    Plan:  Patient was evaluated and treated and all questions answered.  S/p foot surgery left -Progressing as expected post-operatively. -XR: taken and reviewed as above. -WB Status: WBAT in boot -Sutures: intact. -Will admit patient to the hospital for Osteomyelitis. -Medications: none refilled.   Return in about 2 weeks (around 08/27/2019).

## 2020-05-13 NOTE — Progress Notes (Signed)
Subjective:  Patient ID: Eugene Diaz, male    DOB: 12-Apr-1966,  MRN: [id number]  Chief Complaint  Patient presents with  . Wound Check    R post-op site. Pt stated, "I can't remember when I was in the hospital. I had a PICC line with antibiotics, but it came out last night while I was sleeping. I have neuropathy, so I don't have pain. I haven't noticed any odors. My sister changes the dressing. I haven't been able to avoid putting some weight on the foot".   . Diabetes    Pt stated, "I don't usually check my glucose, but I did this morning. It was 87mg /dL. It's normally 200-300mg /dL".    54 y.o. male returns for post-op check.  His [place] nurse has contacted with concerns about his non-compliance and not using his antibiotics.  Review of Systems: Negative except as noted in the HPI. Denies N/V/F/Ch.  Past Medical History:  Diagnosis Date  . Alcohol abuse   . Alcoholic peripheral neuropathy ([place])   . Anxiety   . Arthritis   . Asthma    followed by pcp  . B12 deficiency   . Depression   . Diabetic neuropathy ([place])   . Edema of both lower extremities   . Essential tremor    neurologist-- dr patel--- due to alcohol abuse  . GERD (gastroesophageal reflux disease)   . History of cellulitis 2020   left lower leg;   recurrent 03-25-2020  . History of esophageal stricture    s/p  dilatation 02-13-2018  . History of osteomyelitis 2020   left great toe    . Hypercholesterolemia   . Hypertension    followed by pcp  (nuclear stress test 03-11-2014 low risk w/ no ischemia, ef 65%  . Normocytic anemia    followed by pcp   (03-18-2020 had transfusion's 02/ 2021)  . Peripheral vascular disease ([place])   . 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 Thrombocytopenia ([facility])    chronic  . Type 2 diabetes mellitus ([place])    followed by pcp---    (03-18-2020  pt stated does not check blood sugar)    Current Outpatient Medications:  .  gabapentin (NEURONTIN) 300 MG capsule, Take 900mg  in the morning and at noon, then take 1200mg  at bedtime (Patient taking differently: Take 900-1,200 mg by mouth See admin instructions. Take 3 capsules (900 mg) by mouth every morning and at noon, take 4 capsules (1200 mg) at bedtime), Disp: 300 capsule, Rfl: 11 .  acetaminophen (TYLENOL) 325 MG tablet, Take 2 tablets (650 mg total) by mouth every 6 (six) hours as needed for mild pain (or Fever >/= 101)., Disp: 20 tablet, Rfl: 0 .  albuterol (PROVENTIL HFA;VENTOLIN HFA) 108 (90 Base) MCG/ACT inhaler, Inhale 2 puffs into the lungs every 6 (six) hours as needed for wheezing or shortness of breath., Disp: 1 Inhaler, Rfl: 2 .  ALPRAZolam (XANAX) 1 MG tablet, Take 0.5 tablets (0.5 mg total) by mouth 4 (four) times daily as needed for anxiety., Disp: 12 tablet, Rfl: 0 .  amitriptyline (ELAVIL) 100 MG tablet, Take 100 mg by mouth at bedtime., Disp: , Rfl:  .  atorvastatin (LIPITOR) 40  MG tablet, Take 40 mg by mouth every evening. , Disp: , Rfl: 10 .  clindamycin (CLEOCIN) 300 MG capsule, Take 1 capsule (300 mg total) by mouth 2 (two) times daily., Disp: 14 capsule, Rfl: 0 .  diclofenac Sodium (VOLTAREN) 1 % GEL, Apply 1 application topically daily as needed (pain)., Disp: , Rfl:  .  docusate sodium (COLACE) 100 MG capsule, Take 100 mg by mouth daily as needed for mild constipation., Disp: , Rfl:  .  DULoxetine (CYMBALTA) 60 MG capsule, Take 60 mg by mouth daily., Disp: , Rfl:  .  ferrous sulfate 325 (65 FE) MG tablet, Take 1 tablet (325 mg total) by mouth daily. (Patient taking differently: Take 325 mg by mouth daily. ), Disp: 30 tablet, Rfl: 1 .  glimepiride (AMARYL) 4 MG tablet, Take 4 mg by mouth daily. , Disp: , Rfl:  .  glycopyrrolate (ROBINUL) 1 MG  tablet, Take 1 mg by mouth 2 (two) times daily. , Disp: , Rfl: 2 .  ibuprofen (ADVIL) 400 MG tablet, Take 1 tablet (400 mg total) by mouth every 8 (eight) hours as needed for moderate pain., Disp: 30 tablet, Rfl: 0 .  lisinopril (PRINIVIL,ZESTRIL) 20 MG tablet, Take 20 mg by mouth daily. , Disp: , Rfl: 4 .  omeprazole (PRILOSEC) 20 MG capsule, TAKE 1 CAPSULE WITH BREAKFAST AND SUPPER. (Patient taking differently: Take 20 mg by mouth 2 (two) times daily before a meal. ), Disp: 60 capsule, Rfl: 5 .  primidone (MYSOLINE) 50 MG tablet, Take 1 tablet (50 mg total) by mouth 2 (two) times daily., Disp: 60 tablet, Rfl: 11  Social History   Tobacco Use  Smoking Status Former Smoker  . Packs/day: 2.50  . Years: 25.00  . Pack years: 62.50  . Types: Cigarettes  . Quit date: 08/05/1984  . Years since quitting: 35.7  Smokeless Tobacco Never Used    No Known Allergies Objective:   Vitals:   01/30/20 1130  Temp: (!) 96.3 F (35.7 C)   There is no height or weight on file to calculate BMI. Constitutional Well developed. Well nourished.  Vascular Foot warm and well perfused. Capillary refill normal to all digits.   Neurologic Normal speech. Oriented to person, place, and time. Epicritic sensation to light touch grossly present bilaterally.  Dermatologic  cellulitis present to the incision site.  There is maceration present there is small dehiscence present staples and sutures are held together.  Incision is healing slowly. No concern for deep abscess or fluctuance.  No purulent drainage expressed.  Orthopedic:  Mild tenderness to the incision site.    Radiographs: None Assessment:   1. Post-operative state    Plan:  Patient was evaluated and treated and all questions answered.  S/p foot surgery left -Progressing as expected post-operatively. -XR: None -WB Status: Nonweightbearing to the left lower extremity with a cam boot -Sutures: Staples are intact without any signs of dehiscence.   No clinical signs of infection noted.  No active bleeding noted -Medications: None -Foot redressed. -Patient was very noncompliant in his ambulatory status.   -Dress with betadine WTD daily.    No follow-ups on file.

## 2020-05-13 NOTE — Progress Notes (Signed)
  Subjective:  Patient ID: BENECIO KLUGER, male    DOB: 1966-08-05,  MRN: [id number]  Chief Complaint  Patient presents with  . Wound Check    L. Pt stated, "Swelling has improved. I've had some bleeding". No unusual drainage/foul odor/fever/chills/N&V per pt.    DOS: 12/21/19 Procedure: Chopart Amputation left  54 y.o. male presents with the above complaint. History confirmed with patient.   Objective:  Physical Exam: tenderness at the surgical site, local edema noted and calf supple, nontender. Incision: wound continues to mature. No true dehiscence. Await further skin maturation.  No warmth erythema present Assessment:   1. 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  Post-operative State -Wounds continue to improve.  Continue surgical shoe. Would benefit from TAL.  No follow-ups on file.

## 2020-05-13 NOTE — Progress Notes (Signed)
  Subjective:  Patient ID: Eugene Diaz, male    DOB: 1965/12/18,  MRN: [id number]  Chief Complaint  Patient presents with  . Routine Post Op    L foot. Pt stated, "Doing great. No pain due to neuropathy. The color [in the leg and foot] also looks better". No fever/chills/foul odor/drainage/bleeding. Pt does not test glucose.     DOS: 12/21/19 Procedure: Chopart Amputation left  54 y.o. male presents with the above complaint. History confirmed with patient.   Objective:  Physical Exam: tenderness at the surgical site, local edema noted and calf supple, nontender. Incision: wound continues to mature. No true dehiscence. Await further skin maturation.  No warmth erythema present. Cellulitis receded. Assessment:   1. 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 Acquired equinus deformity of left foot     Plan:  Patient was evaluated and treated and all questions answered.  Equinus -Had to cancel TAL due to edema. Continue abx to completion. Will reschedule.  No follow-ups on file.

## 2020-05-14 ENCOUNTER — Ambulatory Visit (INDEPENDENT_AMBULATORY_CARE_PROVIDER_SITE_OTHER): Payer: Medicaid Other | Admitting: Podiatry

## 2020-05-14 DIAGNOSIS — Z5329 Procedure and treatment not carried out because of patient's decision for other reasons: Secondary | ICD-10-CM

## 2020-05-14 NOTE — Progress Notes (Signed)
No show for appt. 

## 2020-05-22 ENCOUNTER — Encounter: Payer: Medicaid Other | Admitting: Podiatry

## 2020-05-27 ENCOUNTER — Other Ambulatory Visit: Payer: Self-pay

## 2020-05-27 ENCOUNTER — Ambulatory Visit (INDEPENDENT_AMBULATORY_CARE_PROVIDER_SITE_OTHER): Payer: Self-pay | Admitting: Podiatry

## 2020-05-27 DIAGNOSIS — E1142 Type 2 diabetes mellitus with diabetic polyneuropathy: Secondary | ICD-10-CM

## 2020-05-27 DIAGNOSIS — M216X2 Other acquired deformities of left foot: Secondary | ICD-10-CM

## 2020-05-27 DIAGNOSIS — Z89432 Acquired absence of left foot: Secondary | ICD-10-CM

## 2020-05-27 DIAGNOSIS — L97509 Non-pressure chronic ulcer of other part of unspecified foot with unspecified severity: Secondary | ICD-10-CM

## 2020-05-27 DIAGNOSIS — T8789 Other complications of amputation stump: Secondary | ICD-10-CM

## 2020-05-27 NOTE — Progress Notes (Signed)
  Subjective:  Patient ID: Eugene Diaz, male    DOB: 1966/10/11,  MRN: [id number]  Chief Complaint  Patient presents with  . Diabetic Ulcer    left, status post transmetatarsal amputation - callus/preulcerative callus along incision    54 y.o. male presents with the above complaint. History confirmed with patient.  He was planned to have surgery on 6/23 at [facility] for a Achilles tendon lengthening.  His surgery was rescheduled, and he missed his appointment 2 weeks ago with [name].  He is here today for evaluation  Objective:  Physical Exam: warm, good capillary refill and reduced epicritic sensation on the left foot which is status post transmetatarsal amputation, there is significant hyperkeratosis and preulcerative callus with a minor ulcerated fissure in the central area of this.  This measures approximately 6 mm x 2 mm x 2 mm.  No signs of infection  Assessment:   1. Acquired equinus deformity of left foot   2. DM type 2 with diabetic peripheral neuropathy ([place])   3. Ulcer of amputation stump of foot ([place])   4. History of transmetatarsal amputation of left foot ([place])      Plan:  Patient was evaluated and treated and all questions answered.   -Dressing applied consisting of Iodosorb and Mepilex -Offload ulcer with CAM boot -Wound cleansed and debrided Procedure: Selective Debridement of Wound Rationale: Removal of devitalized tissue from the wound to promote healing.  Pre-Debridement Wound Measurements: 0.6 cm x 0.2 cm x 0.2 cm  Post-Debridement Wound Measurements: same as pre-debridement. Type of Debridement: sharp selective Tissue Removed: Devitalized soft-tissue Dressing: Dry, sterile, compression dressing. Disposition: Patient tolerated procedure well. Patient to return in 1 week for follow-up.   Return in about 3 weeks (around 06/17/2020) for diabetic ulcer with Dr [name].

## 2020-05-28 ENCOUNTER — Encounter: Payer: Self-pay | Admitting: Podiatry

## 2020-06-19 ENCOUNTER — Ambulatory Visit: Payer: Medicaid Other | Admitting: Neurology

## 2020-06-21 ENCOUNTER — Inpatient Hospital Stay (HOSPITAL_COMMUNITY)
Admission: EM | Admit: 2020-06-21 | Discharge: 2020-06-30 | DRG: 853 | Disposition: A | Discharge status: Rehabilitation Facility | Payer: Medicaid Other | Attending: Internal Medicine | Admitting: Internal Medicine

## 2020-06-21 ENCOUNTER — Other Ambulatory Visit: Payer: Self-pay

## 2020-06-21 ENCOUNTER — Inpatient Hospital Stay (HOSPITAL_COMMUNITY): Payer: Medicaid Other

## 2020-06-21 ENCOUNTER — Emergency Department (HOSPITAL_COMMUNITY): Payer: Medicaid Other

## 2020-06-21 ENCOUNTER — Encounter (HOSPITAL_COMMUNITY): Payer: Self-pay

## 2020-06-21 DIAGNOSIS — Z20822 Contact with and (suspected) exposure to covid-19: Secondary | ICD-10-CM | POA: Diagnosis present

## 2020-06-21 DIAGNOSIS — E1140 Type 2 diabetes mellitus with diabetic neuropathy, unspecified: Secondary | ICD-10-CM | POA: Diagnosis present

## 2020-06-21 DIAGNOSIS — M86271 Subacute osteomyelitis, right ankle and foot: Secondary | ICD-10-CM

## 2020-06-21 DIAGNOSIS — L97329 Non-pressure chronic ulcer of left ankle with unspecified severity: Secondary | ICD-10-CM | POA: Diagnosis present

## 2020-06-21 DIAGNOSIS — Z87891 Personal history of nicotine dependence: Secondary | ICD-10-CM

## 2020-06-21 DIAGNOSIS — R000 Tachycardia, unspecified: Secondary | ICD-10-CM | POA: Diagnosis present

## 2020-06-21 DIAGNOSIS — M7989 Other specified soft tissue disorders: Secondary | ICD-10-CM

## 2020-06-21 DIAGNOSIS — L97509 Non-pressure chronic ulcer of other part of unspecified foot with unspecified severity: Secondary | ICD-10-CM

## 2020-06-21 DIAGNOSIS — F418 Other specified anxiety disorders: Secondary | ICD-10-CM | POA: Diagnosis not present

## 2020-06-21 DIAGNOSIS — G621 Alcoholic polyneuropathy: Secondary | ICD-10-CM | POA: Diagnosis present

## 2020-06-21 DIAGNOSIS — M86272 Subacute osteomyelitis, left ankle and foot: Secondary | ICD-10-CM

## 2020-06-21 DIAGNOSIS — E1165 Type 2 diabetes mellitus with hyperglycemia: Secondary | ICD-10-CM | POA: Diagnosis present

## 2020-06-21 DIAGNOSIS — F1020 Alcohol dependence, uncomplicated: Secondary | ICD-10-CM | POA: Diagnosis present

## 2020-06-21 DIAGNOSIS — E1169 Type 2 diabetes mellitus with other specified complication: Secondary | ICD-10-CM | POA: Diagnosis present

## 2020-06-21 DIAGNOSIS — Z8719 Personal history of other diseases of the digestive system: Secondary | ICD-10-CM

## 2020-06-21 DIAGNOSIS — M86172 Other acute osteomyelitis, left ankle and foot: Secondary | ICD-10-CM

## 2020-06-21 DIAGNOSIS — L03116 Cellulitis of left lower limb: Secondary | ICD-10-CM | POA: Diagnosis present

## 2020-06-21 DIAGNOSIS — Z6837 Body mass index (BMI) 37.0-37.9, adult: Secondary | ICD-10-CM | POA: Diagnosis not present

## 2020-06-21 DIAGNOSIS — D62 Acute posthemorrhagic anemia: Secondary | ICD-10-CM | POA: Diagnosis not present

## 2020-06-21 DIAGNOSIS — I10 Essential (primary) hypertension: Secondary | ICD-10-CM | POA: Diagnosis present

## 2020-06-21 DIAGNOSIS — M86162 Other acute osteomyelitis, left tibia and fibula: Secondary | ICD-10-CM | POA: Diagnosis present

## 2020-06-21 DIAGNOSIS — E785 Hyperlipidemia, unspecified: Secondary | ICD-10-CM | POA: Diagnosis present

## 2020-06-21 DIAGNOSIS — Z7984 Long term (current) use of oral hypoglycemic drugs: Secondary | ICD-10-CM

## 2020-06-21 DIAGNOSIS — A419 Sepsis, unspecified organism: Principal | ICD-10-CM | POA: Diagnosis present

## 2020-06-21 DIAGNOSIS — S88112A Complete traumatic amputation at level between knee and ankle, left lower leg, initial encounter: Secondary | ICD-10-CM | POA: Diagnosis not present

## 2020-06-21 DIAGNOSIS — D638 Anemia in other chronic diseases classified elsewhere: Secondary | ICD-10-CM | POA: Diagnosis present

## 2020-06-21 DIAGNOSIS — E871 Hypo-osmolality and hyponatremia: Secondary | ICD-10-CM | POA: Diagnosis present

## 2020-06-21 DIAGNOSIS — E1151 Type 2 diabetes mellitus with diabetic peripheral angiopathy without gangrene: Secondary | ICD-10-CM | POA: Diagnosis present

## 2020-06-21 DIAGNOSIS — D696 Thrombocytopenia, unspecified: Secondary | ICD-10-CM | POA: Diagnosis present

## 2020-06-21 DIAGNOSIS — L02612 Cutaneous abscess of left foot: Secondary | ICD-10-CM | POA: Diagnosis present

## 2020-06-21 DIAGNOSIS — Z79899 Other long term (current) drug therapy: Secondary | ICD-10-CM

## 2020-06-21 DIAGNOSIS — M009 Pyogenic arthritis, unspecified: Secondary | ICD-10-CM | POA: Diagnosis present

## 2020-06-21 DIAGNOSIS — Z8249 Family history of ischemic heart disease and other diseases of the circulatory system: Secondary | ICD-10-CM

## 2020-06-21 DIAGNOSIS — E11628 Type 2 diabetes mellitus with other skin complications: Secondary | ICD-10-CM

## 2020-06-21 DIAGNOSIS — E669 Obesity, unspecified: Secondary | ICD-10-CM | POA: Diagnosis present

## 2020-06-21 DIAGNOSIS — L089 Local infection of the skin and subcutaneous tissue, unspecified: Secondary | ICD-10-CM | POA: Diagnosis not present

## 2020-06-21 DIAGNOSIS — E43 Unspecified severe protein-calorie malnutrition: Secondary | ICD-10-CM | POA: Diagnosis present

## 2020-06-21 DIAGNOSIS — E1141 Type 2 diabetes mellitus with diabetic mononeuropathy: Secondary | ICD-10-CM | POA: Diagnosis not present

## 2020-06-21 DIAGNOSIS — F1010 Alcohol abuse, uncomplicated: Secondary | ICD-10-CM | POA: Diagnosis present

## 2020-06-21 DIAGNOSIS — G8929 Other chronic pain: Secondary | ICD-10-CM | POA: Diagnosis present

## 2020-06-21 DIAGNOSIS — Z9181 History of falling: Secondary | ICD-10-CM

## 2020-06-21 DIAGNOSIS — E11621 Type 2 diabetes mellitus with foot ulcer: Secondary | ICD-10-CM

## 2020-06-21 DIAGNOSIS — Z791 Long term (current) use of non-steroidal anti-inflammatories (NSAID): Secondary | ICD-10-CM

## 2020-06-21 DIAGNOSIS — Z86010 Personal history of colonic polyps: Secondary | ICD-10-CM

## 2020-06-21 DIAGNOSIS — R296 Repeated falls: Secondary | ICD-10-CM | POA: Diagnosis present

## 2020-06-21 DIAGNOSIS — R21 Rash and other nonspecific skin eruption: Secondary | ICD-10-CM | POA: Diagnosis present

## 2020-06-21 DIAGNOSIS — M79606 Pain in leg, unspecified: Secondary | ICD-10-CM | POA: Diagnosis present

## 2020-06-21 LAB — CBC WITH DIFFERENTIAL/PLATELET
Abs Immature Granulocytes: 0.07 10*3/uL (ref 0.00–0.07)
Basophils Absolute: 0 10*3/uL (ref 0.0–0.1)
Basophils Relative: 1 %
Eosinophils Absolute: 0.1 10*3/uL (ref 0.0–0.5)
Eosinophils Relative: 1 %
HCT: 27.8 % — ABNORMAL LOW (ref 39.0–52.0)
Hemoglobin: 8.5 g/dL — ABNORMAL LOW (ref 13.0–17.0)
Immature Granulocytes: 1 %
Lymphocytes Relative: 11 %
Lymphs Abs: 1 10*3/uL (ref 0.7–4.0)
MCH: 26.6 pg (ref 26.0–34.0)
MCHC: 30.6 g/dL (ref 30.0–36.0)
MCV: 86.9 fL (ref 80.0–100.0)
Monocytes Absolute: 0.6 10*3/uL (ref 0.1–1.0)
Monocytes Relative: 6 %
Neutro Abs: 7.1 10*3/uL (ref 1.7–7.7)
Neutrophils Relative %: 80 %
Platelets: 374 10*3/uL (ref 150–400)
RBC: 3.2 MIL/uL — ABNORMAL LOW (ref 4.22–5.81)
RDW: 16 % — ABNORMAL HIGH (ref 11.5–15.5)
WBC: 8.8 10*3/uL (ref 4.0–10.5)
nRBC: 0 % (ref 0.0–0.2)

## 2020-06-21 LAB — COMPREHENSIVE METABOLIC PANEL
ALT: 9 U/L (ref 0–44)
AST: 19 U/L (ref 15–41)
Albumin: 2.9 g/dL — ABNORMAL LOW (ref 3.5–5.0)
Alkaline Phosphatase: 92 U/L (ref 38–126)
Anion gap: 9 (ref 5–15)
BUN: 16 mg/dL (ref 6–20)
CO2: 22 mmol/L (ref 22–32)
Calcium: 8.8 mg/dL — ABNORMAL LOW (ref 8.9–10.3)
Chloride: 102 mmol/L (ref 98–111)
Creatinine, Ser: 1.24 mg/dL (ref 0.61–1.24)
GFR calc Af Amer: >60 mL/min (ref >60)
GFR calc non Af Amer: >60 mL/min (ref >60)
Glucose, Bld: 280 mg/dL — ABNORMAL HIGH (ref 70–99)
Potassium: 4.3 mmol/L (ref 3.5–5.1)
Sodium: 133 mmol/L — ABNORMAL LOW (ref 135–145)
Total Bilirubin: 0.5 mg/dL (ref 0.3–1.2)
Total Protein: 8.5 g/dL — ABNORMAL HIGH (ref 6.5–8.1)

## 2020-06-21 LAB — URINALYSIS, ROUTINE W REFLEX MICROSCOPIC
Bilirubin Urine: NEGATIVE
Glucose, UA: 150 mg/dL — AB
Ketones, ur: NEGATIVE mg/dL
Leukocytes,Ua: NEGATIVE
Nitrite: POSITIVE — AB
Protein, ur: 100 mg/dL — AB
Specific Gravity, Urine: 1.014 (ref 1.005–1.030)
pH: 5 (ref 5.0–8.0)

## 2020-06-21 LAB — SARS CORONAVIRUS 2 BY RT PCR (HOSPITAL ORDER, PERFORMED IN ~~LOC~~ HOSPITAL LAB): SARS Coronavirus 2: NEGATIVE

## 2020-06-21 LAB — LACTIC ACID, PLASMA
Lactic Acid, Venous: 2.2 mmol/L (ref 0.5–1.9)
Lactic Acid, Venous: 1.8 mmol/L (ref 0.5–1.9)

## 2020-06-21 LAB — CBG MONITORING, ED: Glucose-Capillary: 143 mg/dL — ABNORMAL HIGH (ref 70–99)

## 2020-06-21 LAB — APTT: aPTT: 37 seconds — ABNORMAL HIGH (ref 24–36)

## 2020-06-21 LAB — SEDIMENTATION RATE: Sed Rate: 120 mm/hr — ABNORMAL HIGH (ref 0–16)

## 2020-06-21 LAB — PHOSPHORUS: Phosphorus: 2.7 mg/dL (ref 2.5–4.6)

## 2020-06-21 LAB — GLUCOSE, CAPILLARY: Glucose-Capillary: 201 mg/dL — ABNORMAL HIGH (ref 70–99)

## 2020-06-21 LAB — PROTIME-INR
INR: 1.1 (ref 0.8–1.2)
Prothrombin Time: 13.9 s (ref 11.4–15.2)

## 2020-06-21 LAB — MAGNESIUM: Magnesium: 1.9 mg/dL (ref 1.7–2.4)

## 2020-06-21 LAB — C-REACTIVE PROTEIN: CRP: 20.9 mg/dL — ABNORMAL HIGH (ref <1.0)

## 2020-06-21 MED ORDER — SODIUM CHLORIDE 0.9 % IV SOLN
2.0000 g | Freq: Three times a day (TID) | INTRAVENOUS | Status: AC
  Administered 2020-06-21 – 2020-06-27 (×19): 2 g via INTRAVENOUS
  Filled 2020-06-21 (×19): qty 2

## 2020-06-21 MED ORDER — VANCOMYCIN HCL IN DEXTROSE 1-5 GM/200ML-% IV SOLN
1000.0000 mg | Freq: Two times a day (BID) | INTRAVENOUS | Status: AC
  Administered 2020-06-22 – 2020-06-27 (×12): 1000 mg via INTRAVENOUS
  Filled 2020-06-21 (×14): qty 200

## 2020-06-21 MED ORDER — DULOXETINE HCL 60 MG PO CPEP
60.0000 mg | ORAL_CAPSULE | Freq: Every day | ORAL | Status: DC
  Administered 2020-06-23 – 2020-06-30 (×8): 60 mg via ORAL
  Filled 2020-06-21 (×9): qty 1

## 2020-06-21 MED ORDER — GABAPENTIN 300 MG PO CAPS: 900.0000 mg | ORAL_CAPSULE | ORAL | Status: DC

## 2020-06-21 MED ORDER — LISINOPRIL 20 MG PO TABS
20.0000 mg | ORAL_TABLET | Freq: Every day | ORAL | Status: DC
  Administered 2020-06-22 – 2020-06-26 (×5): 20 mg via ORAL
  Filled 2020-06-21 (×5): qty 1

## 2020-06-21 MED ORDER — THIAMINE HCL 100 MG PO TABS
100.0000 mg | ORAL_TABLET | Freq: Every day | ORAL | Status: DC
  Administered 2020-06-23 – 2020-06-30 (×7): 100 mg via ORAL
  Filled 2020-06-21 (×8): qty 1

## 2020-06-21 MED ORDER — OXYCODONE HCL 5 MG PO TABS
30.0000 mg | ORAL_TABLET | ORAL | Status: DC | PRN
  Administered 2020-06-21 – 2020-06-30 (×16): 30 mg via ORAL
  Filled 2020-06-21 (×16): qty 6

## 2020-06-21 MED ORDER — VANCOMYCIN HCL IN DEXTROSE 1-5 GM/200ML-% IV SOLN: 1000.0000 mg | Freq: Once | INTRAVENOUS | Status: DC

## 2020-06-21 MED ORDER — FOLIC ACID 1 MG PO TABS
1.0000 mg | ORAL_TABLET | Freq: Every day | ORAL | Status: DC
  Administered 2020-06-23 – 2020-06-30 (×7): 1 mg via ORAL
  Filled 2020-06-21 (×8): qty 1

## 2020-06-21 MED ORDER — ACETAMINOPHEN 325 MG PO TABS: 650.0000 mg | ORAL_TABLET | Freq: Four times a day (QID) | ORAL | Status: DC | PRN

## 2020-06-21 MED ORDER — METRONIDAZOLE IN NACL 5-0.79 MG/ML-% IV SOLN
500.0000 mg | Freq: Three times a day (TID) | INTRAVENOUS | Status: AC
  Administered 2020-06-21 – 2020-06-27 (×19): 500 mg via INTRAVENOUS
  Filled 2020-06-21 (×19): qty 100

## 2020-06-21 MED ORDER — PANTOPRAZOLE SODIUM 40 MG PO TBEC
40.0000 mg | DELAYED_RELEASE_TABLET | Freq: Every day | ORAL | Status: DC
  Administered 2020-06-23 – 2020-06-30 (×8): 40 mg via ORAL
  Filled 2020-06-21 (×9): qty 1

## 2020-06-21 MED ORDER — POLYETHYLENE GLYCOL 3350 17 G PO PACK: 17.0000 g | PACK | Freq: Every day | ORAL | Status: DC | PRN

## 2020-06-21 MED ORDER — LORAZEPAM 2 MG/ML IJ SOLN: 1.0000 mg | INTRAMUSCULAR | Status: AC | PRN

## 2020-06-21 MED ORDER — GLYCOPYRROLATE 1 MG PO TABS
1.0000 mg | ORAL_TABLET | Freq: Two times a day (BID) | ORAL | Status: DC
  Administered 2020-06-22 – 2020-06-30 (×16): 1 mg via ORAL
  Filled 2020-06-21 (×19): qty 1

## 2020-06-21 MED ORDER — HEPARIN SODIUM (PORCINE) 5000 UNIT/ML IJ SOLN
5000.0000 [IU] | Freq: Once | INTRAMUSCULAR | Status: AC
  Administered 2020-06-21: 5000 [IU] via SUBCUTANEOUS
  Filled 2020-06-21: qty 1

## 2020-06-21 MED ORDER — ONDANSETRON HCL 4 MG PO TABS: 4.0000 mg | ORAL_TABLET | Freq: Four times a day (QID) | ORAL | Status: DC | PRN

## 2020-06-21 MED ORDER — INSULIN ASPART 100 UNIT/ML ~~LOC~~ SOLN
0.0000 [IU] | Freq: Four times a day (QID) | SUBCUTANEOUS | Status: DC
  Administered 2020-06-21 – 2020-06-22 (×2): 2 [IU] via SUBCUTANEOUS
  Administered 2020-06-22: 5 [IU] via SUBCUTANEOUS
  Administered 2020-06-22 (×2): 2 [IU] via SUBCUTANEOUS
  Administered 2020-06-23: 8 [IU] via SUBCUTANEOUS
  Administered 2020-06-23: 15 [IU] via SUBCUTANEOUS
  Administered 2020-06-23 (×3): 8 [IU] via SUBCUTANEOUS
  Administered 2020-06-24: 11 [IU] via SUBCUTANEOUS
  Filled 2020-06-21: qty 1

## 2020-06-21 MED ORDER — ADULT MULTIVITAMIN W/MINERALS CH
1.0000 | ORAL_TABLET | Freq: Every day | ORAL | Status: DC
  Administered 2020-06-23 – 2020-06-30 (×7): 1 via ORAL
  Filled 2020-06-21 (×8): qty 1

## 2020-06-21 MED ORDER — METRONIDAZOLE IN NACL 5-0.79 MG/ML-% IV SOLN
500.0000 mg | Freq: Once | INTRAVENOUS | Status: AC
  Administered 2020-06-21: 500 mg via INTRAVENOUS
  Filled 2020-06-21: qty 100

## 2020-06-21 MED ORDER — LACTATED RINGERS IV BOLUS (SEPSIS)
1000.0000 mL | Freq: Once | INTRAVENOUS | Status: AC
  Administered 2020-06-21: 1000 mL via INTRAVENOUS

## 2020-06-21 MED ORDER — GABAPENTIN 300 MG PO CAPS
900.0000 mg | ORAL_CAPSULE | Freq: Every day | ORAL | Status: DC
  Administered 2020-06-23 – 2020-06-25 (×3): 900 mg via ORAL
  Filled 2020-06-21 (×4): qty 3

## 2020-06-21 MED ORDER — PRIMIDONE 50 MG PO TABS
50.0000 mg | ORAL_TABLET | Freq: Two times a day (BID) | ORAL | Status: DC
  Administered 2020-06-22 – 2020-06-30 (×16): 50 mg via ORAL
  Filled 2020-06-21 (×19): qty 1

## 2020-06-21 MED ORDER — ACETAMINOPHEN 650 MG RE SUPP: 650.0000 mg | Freq: Four times a day (QID) | RECTAL | Status: DC | PRN

## 2020-06-21 MED ORDER — LACTATED RINGERS IV SOLN: INTRAVENOUS | Status: DC

## 2020-06-21 MED ORDER — VANCOMYCIN HCL 2000 MG/400ML IV SOLN
2000.0000 mg | Freq: Once | INTRAVENOUS | Status: AC
  Administered 2020-06-21: 2000 mg via INTRAVENOUS
  Filled 2020-06-21: qty 400

## 2020-06-21 MED ORDER — THIAMINE HCL 100 MG/ML IJ SOLN: 100.0000 mg | Freq: Every day | INTRAMUSCULAR | Status: DC

## 2020-06-21 MED ORDER — LACTATED RINGERS IV BOLUS (SEPSIS)
600.0000 mL | Freq: Once | INTRAVENOUS | Status: AC
  Administered 2020-06-21: 600 mL via INTRAVENOUS

## 2020-06-21 MED ORDER — ONDANSETRON HCL 4 MG/2ML IJ SOLN: 4.0000 mg | Freq: Four times a day (QID) | INTRAMUSCULAR | Status: DC | PRN

## 2020-06-21 MED ORDER — SODIUM CHLORIDE 0.9 % IV SOLN
2.0000 g | Freq: Once | INTRAVENOUS | Status: AC
  Administered 2020-06-21: 2 g via INTRAVENOUS
  Filled 2020-06-21: qty 2

## 2020-06-21 MED ORDER — LORAZEPAM 1 MG PO TABS: 1.0000 mg | ORAL_TABLET | ORAL | Status: AC | PRN

## 2020-06-21 MED ORDER — ATORVASTATIN CALCIUM 40 MG PO TABS
40.0000 mg | ORAL_TABLET | Freq: Every evening | ORAL | Status: DC
  Administered 2020-06-22 – 2020-06-30 (×8): 40 mg via ORAL
  Filled 2020-06-21 (×9): qty 1

## 2020-06-21 MED ORDER — MORPHINE SULFATE ER 30 MG PO TBCR
30.0000 mg | EXTENDED_RELEASE_TABLET | Freq: Two times a day (BID) | ORAL | Status: DC
  Administered 2020-06-22 – 2020-06-30 (×17): 30 mg via ORAL
  Filled 2020-06-21 (×17): qty 1

## 2020-06-21 MED ORDER — AMITRIPTYLINE HCL 50 MG PO TABS
100.0000 mg | ORAL_TABLET | Freq: Every day | ORAL | Status: DC
  Administered 2020-06-22 – 2020-06-29 (×8): 100 mg via ORAL
  Filled 2020-06-21: qty 1
  Filled 2020-06-21 (×8): qty 2

## 2020-06-21 MED ORDER — ALPRAZOLAM 0.5 MG PO TABS
0.5000 mg | ORAL_TABLET | Freq: Four times a day (QID) | ORAL | Status: DC | PRN
  Administered 2020-06-23 – 2020-06-29 (×5): 0.5 mg via ORAL
  Filled 2020-06-21 (×5): qty 1

## 2020-06-21 MED ORDER — LACTATED RINGERS IV SOLN: INTRAVENOUS | Status: AC

## 2020-06-21 MED ORDER — LORAZEPAM 2 MG/ML IJ SOLN: 0.0000 mg | Freq: Two times a day (BID) | INTRAMUSCULAR | Status: DC

## 2020-06-21 MED ORDER — GABAPENTIN 400 MG PO CAPS
1200.0000 mg | ORAL_CAPSULE | Freq: Every day | ORAL | Status: DC
  Administered 2020-06-22 – 2020-06-24 (×3): 1200 mg via ORAL
  Filled 2020-06-21 (×3): qty 3

## 2020-06-21 MED ORDER — ACETAMINOPHEN 325 MG PO TABS
650.0000 mg | ORAL_TABLET | Freq: Once | ORAL | Status: AC
  Administered 2020-06-21: 650 mg via ORAL
  Filled 2020-06-21: qty 2

## 2020-06-21 MED ORDER — LORAZEPAM 2 MG/ML IJ SOLN: 0.0000 mg | Freq: Four times a day (QID) | INTRAMUSCULAR | Status: DC

## 2020-06-21 NOTE — ED Triage Notes (Signed)
Pt to er, pt states that he has some leg pain, pt has dressing on his L leg with redness and swelling. Pt has had partial amputation of his L foot about a year ago.  Pt has some yellow drainage from the dressing on L foot.

## 2020-06-21 NOTE — ED Notes (Signed)
[facility] on the [address]

## 2020-06-21 NOTE — ED Notes (Signed)
Signature pad did not work. Pt agrees to transfer.  Pt did not want gown. Wanted to stay in own clothes

## 2020-06-21 NOTE — ED Provider Notes (Signed)
[facility] Provider Note   CSN: [id number] Arrival date & time: 06/21/20  1240     History Chief Complaint  Patient presents with  . Leg Pain    Eugene Diaz is a 54 y.o. male.  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  Presents today with increasing redness of the leg with fever up to 103, tachycardia and severe progressive weakness.  A code sepsis was called upon my initial evaluation of the patient around 2:00 PM.  This was gradual in onset, his leg is always red but is become more red more painful and more swollen.  He notes that he has been so weak that he can no longer walk with crutches without falling and has struck his chest a couple of times witih falls.  Past Medical History:  Diagnosis Date  . Alcohol abuse   . Alcoholic peripheral neuropathy ([place])   . Anxiety   . Arthritis   . Asthma    followed by pcp  . B12 deficiency   . Depression   . Diabetic neuropathy ([place])   . Edema of both lower extremities   . Essential tremor    neurologist-- dr patel--- due to alcohol abuse  . GERD (gastroesophageal reflux disease)   . History of cellulitis 2020   left lower leg;   recurrent 03-25-2020  . History of esophageal stricture    s/p  dilatation 02-13-2018  . History of osteomyelitis 2020   left great toe    . Hypercholesterolemia   . Hypertension    followed by pcp  (nuclear stress test 03-11-2014 low risk w/ no ischemia, ef 65%  . Normocytic anemia    followed by pcp   (03-18-2020 had transfusion's 02/ 2021)  . Peripheral vascular disease ([place])   . 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 Thrombocytopenia (HCC)    chronic  . Type 2 diabetes mellitus ([place])    followed by pcp---    (03-18-2020  pt stated does not check blood sugar)    Patient Active Problem List   Diagnosis Date Noted  . Acute blood loss anemia 01/04/2020  . Asthma   . Depression with anxiety   . Osteomyelitis of ankle or foot, acute, left ([place]) 12/17/2019  . Hyperglycemia 12/17/2019  . Type 2 diabetes mellitus with foot ulcer ([place])   . Anemia, chronic disease 11/10/2019  . Acute osteomyelitis of left foot ([place]) 11/10/2019  . Marijuana use 11/10/2019  . Wound dehiscence 08/31/2019  . Osteomyelitis due to type 2 diabetes mellitus ([place]) 08/14/2019  . Acute osteomyelitis of toe of left foot ([place]) 08/14/2019  . Sepsis ([place]) 08/14/2019  . Hyponatremia 08/14/2019  . Normocytic anemia 08/14/2019  . B12 deficiency 08/14/2019  . Osteomyelitis of foot ([place])   . Cellulitis of toe, left   . Diabetic infection of left foot ([place])   . Toe fracture, left 07/13/2019  . Cellulitis 07/12/2019  . GERD (gastroesophageal reflux  disease) 12/19/2018  . Hepatic steatosis 06/18/2018  . Gastritis, erosive   . Dysphagia, idiopathic 12/27/2017  . Rectal bleeding 12/27/2017  . Intention tremor 06/03/2016  . Left knee pain 12/04/2015  . Alcoholic peripheral neuropathy ([place]) 10/08/2014  . Diabetic neuropathy ([place]) 10/08/2014  . Chest pain, rule out acute myocardial infarction 03/10/2014  . Thrombocytopenia ([place]) 03/10/2014  . ETOH abuse 03/10/2014  . Chest pain 03/10/2014  . Diabetes mellitus without complication ([place])   . Hypertension associated with type 2 diabetes mellitus ([place])   . Hyperlipidemia associated with type 2 diabetes mellitus [facility])     Past Surgical History:  Procedure Laterality Date  . AMPUTATION Left 10/16/2019   Procedure: Left partial second ray resection; placement of antibiotic beads;  Surgeon:  Evelina Bucy, DPM;  [facility]: [facility];  Service: Podiatry;  Laterality: Left;  . AMPUTATION Left 11/13/2019   Procedure: Left Midfoot Amputation - Transmetatarsal vs. Lisfranc; Placement antibiotic beads;  Surgeon: Evelina Bucy, DPM;  [facility];  Service: Podiatry;  Laterality: Left;  . AMPUTATION Left 12/21/2019   Procedure: Chopart Amputation left foot;  Surgeon: Evelina Bucy, DPM;  Location: [place];  Service: Podiatry;  Laterality: Left;  . AMPUTATION TOE Left 08/14/2019   Procedure: AMPUTATION GREAT TOE;  Surgeon: Evelina Bucy, DPM;  [facility];  Service: Podiatry;  Laterality: Left;  . APPLICATION OF WOUND VAC Left 12/21/2019   Procedure: Application Of Wound Vac;  Surgeon: Evelina Bucy, DPM;  Location: [place];  Service: Podiatry;  Laterality: Left;  . BIOPSY  02/13/2018   Procedure: BIOPSY;  Surgeon: Danie Binder, MD;  [facility];  Service: Endoscopy;;  transverse colon biopsy, gastric biopsy  . COLONOSCOPY WITH PROPOFOL N/A 02/13/2018   Procedure: COLONOSCOPY WITH PROPOFOL;  Surgeon: Danie Binder, MD;  [facility];  Service: Endoscopy;  Laterality: N/A;  11:15am  . ESOPHAGOGASTRODUODENOSCOPY (EGD) WITH PROPOFOL N/A 02/13/2018   Procedure: ESOPHAGOGASTRODUODENOSCOPY (EGD) WITH PROPOFOL;  Surgeon: Danie Binder, MD;  [facility];  Service: Endoscopy;  Laterality: N/A;  . I & D EXTREMITY Left 07/16/2019   Procedure: Insicion  AND DEBRIDEMENT EXTREMITY;  Surgeon: Evelina Bucy, DPM;  Location: [place];  Service: Podiatry;  Laterality: Left;  . INCISION AND DRAINAGE OF WOUND Left 10/13/2019   Procedure: IRRIGATION AND DEBRIDEMENT WOUND OF LEFT FOOT AND FIRST METATARSAL RESECTION;  Surgeon: Evelina Bucy, DPM;  [facility];  Service: Podiatry;  Laterality: Left;  . IRRIGATION AND DEBRIDEMENT FOOT Left 11/11/2019   Procedure: Left Foot Wound Irrigation and Debridement;  Surgeon: Evelina Bucy, DPM;  [facility];   Service: Podiatry;  Laterality: Left;  . Left arm     Left arm repair (tendon and artery)  . PERCUTANEOUS PINNING  07/16/2019   Procedure: Open Reduction Percutaneous Pinning Extremity;  Surgeon: Evelina Bucy, DPM;  Location: [place];  Service: Podiatry;;  . PILONIDAL CYST EXCISION N/A 08/08/2014   Procedure: CYST EXCISION PILONIDAL EXTENSIVE;  Surgeon: Jamesetta So, MD;  [facility];  Service: General;  Laterality: N/A;  . POLYPECTOMY  02/13/2018   Procedure: POLYPECTOMY;  Surgeon: Danie Binder, MD;  [facility];  Service: Endoscopy;;  transverse colon polyp hs, rectal polyps times 2  . SAVORY DILATION N/A 02/13/2018   Procedure: SAVORY DILATION;  Surgeon: Danie Binder, MD;  [facility];  Service: Endoscopy;  Laterality: N/A;  . WOUND DEBRIDEMENT Left  09/04/2019   Procedure: DEBRIDEMENT WOUND WITH COMPLEX  REPAIR OF DEHISCENCE;  Surgeon: Evelina Bucy, DPM;  Location: [place];  Service: Podiatry;  Laterality: Left;  Leave patient on stretcher  . WOUND DEBRIDEMENT Left 10/16/2019   Procedure: Left foot wound debridement and closure;  Surgeon: Evelina Bucy, DPM;  [facility];  Service: Podiatry;  Laterality: Left;  . WOUND DEBRIDEMENT Left 12/18/2019   Procedure: LEFT FOOT DEBRIDEMENT WITH PARTIAL INCISION OF INFECTED BONE;  Surgeon: Evelina Bucy, DPM;  Location: [place];  Service: Podiatry;  Laterality: Left;  LEFT FOOT DEBRIDEMENT WITH PARTIAL INCISION OF INFECTED BONE       Family History  Problem Relation Age of Onset  . Heart attack Mother        Living, 42  . Cancer Father        Deceased  . Healthy Brother   . Healthy Sister   . Colon cancer Neg Hx   . Colon polyps Neg Hx     Social History   Tobacco Use  . Smoking status: Former Smoker    Packs/day: 2.50    Years: 25.00    Pack years: 62.50    Types: Cigarettes    Quit date: 08/05/1984    Years since quitting: 35.9  . Smokeless tobacco: Never Used    Vaping Use  . Vaping Use: Never used  Substance Use Topics  . Alcohol use: Yes    Alcohol/week: 50.0 standard drinks    Types: 50 Standard drinks or equivalent per week    Comment: Drinks 6 beers, liquor 4-6oz (both daily) x 35 years  . Drug use: Not Currently    Types: Marijuana    Comment: 03-18-2020  per pt none since 11/ 2020    Home Medications Prior to Admission medications   Medication Sig Start Date End Date Taking? Authorizing Provider  acetaminophen (TYLENOL) 325 MG tablet Take 2 tablets (650 mg total) by mouth every 6 (six) hours as needed for mild pain (or Fever >/= 101). 12/27/19   Arrien, [name], MD  albuterol (PROVENTIL HFA;VENTOLIN HFA) 108 (90 Base) MCG/ACT inhaler Inhale 2 puffs into the lungs every 6 (six) hours as needed for wheezing or shortness of breath. 10/31/18   Hayden Rasmussen, MD  ALPRAZolam Duanne Moron) 1 MG tablet Take 0.5 tablets (0.5 mg total) by mouth 4 (four) times daily as needed for anxiety. 01/21/20   Caren Griffins, MD  amitriptyline (ELAVIL) 100 MG tablet Take 100 mg by mouth at bedtime.    [provider]  atorvastatin (LIPITOR) 40 MG tablet Take 40 mg by mouth every evening.  12/17/15   [provider]  clindamycin (CLEOCIN) 300 MG capsule Take 1 capsule (300 mg total) by mouth 2 (two) times daily. 04/22/20   Evelina Bucy, DPM  diclofenac Sodium (VOLTAREN) 1 % GEL Apply 1 application topically daily as needed (pain).    [provider]  docusate sodium (COLACE) 100 MG capsule Take 100 mg by mouth daily as needed for mild constipation.    [provider]  DULoxetine (CYMBALTA) 60 MG capsule Take 60 mg by mouth daily. 11/18/19   [provider]  ferrous sulfate 325 (65 FE) MG tablet Take 1 tablet (325 mg total) by mouth daily. Patient taking differently: Take 325 mg by mouth daily.  11/15/19 11/14/20  Shelly Coss, MD  furosemide (LASIX) 20 MG tablet Take 20 mg by mouth daily as needed. 03/18/20  [provider]  gabapentin (NEURONTIN) 300 MG capsule Take 900mg  in the morning and at noon, then take 1200mg  at bedtime Patient taking differently: Take 900-1,200 mg by mouth See admin instructions. Take 3 capsules (900 mg) by mouth every morning and at noon, take 4 capsules (1200 mg) at bedtime 06/14/19   Narda Amber K, DO  glimepiride (AMARYL) 4 MG tablet Take 4 mg by mouth daily.  08/10/19   [provider]  glycopyrrolate (ROBINUL) 1 MG tablet Take 1 mg by mouth 2 (two) times daily.  12/17/15   [provider]  hydrOXYzine (ATARAX/VISTARIL) 25 MG tablet Take 25 mg by mouth 4 (four) times daily as needed. 05/18/20   [provider]  ibuprofen (ADVIL) 400 MG tablet Take 1 tablet (400 mg total) by mouth every 8 (eight) hours as needed for moderate pain. 12/27/19   Arrien, [name], MD  lisinopril (PRINIVIL,ZESTRIL) 20 MG tablet Take 20 mg by mouth daily.  12/30/15   [provider]  morphine (MS CONTIN) 15 MG 12 hr tablet SMARTSIG:1 Tablet(s) By Mouth Every 12 Hours 04/22/20   [provider]  morphine (MS CONTIN) 30 MG 12 hr tablet SMARTSIG:1 Tablet(s) By Mouth Every 12 Hours 05/21/20   [provider]  omeprazole (PRILOSEC) 20 MG capsule TAKE 1 CAPSULE WITH BREAKFAST AND SUPPER. Patient taking differently: Take 20 mg by mouth 2 (two) times daily before a meal.  11/19/19   Mahala Menghini, PA-C  oxycodone (ROXICODONE) 30 MG immediate release tablet Take 30 mg by mouth 4 (four) times daily as needed. 05/08/20   [provider]  primidone (MYSOLINE) 50 MG tablet Take 1 tablet (50 mg total) by mouth 2 (two) times daily. 06/14/19   Narda Amber K, DO    Allergies    Patient has no known allergies.  Review of Systems   Review of Systems  All other systems reviewed and are negative.   Physical Exam Updated Vital Signs BP (!) 151/81 (BP Location: Right Arm)   Pulse (!) 123   Temp (!) 102.9 F (39.4 C) (Oral)   Resp 18    Ht 1.88 m (6\' 2" )   Wt 128.8 kg   SpO2 100%   BMI 36.46 kg/m   Physical Exam Vitals and nursing note reviewed.  Constitutional:      General: He is in acute distress.     Appearance: He is well-developed. He is ill-appearing.  HENT:     Head: Normocephalic and atraumatic.     Mouth/Throat:     Pharynx: No oropharyngeal exudate.  Eyes:     General: No scleral icterus.       Right eye: No discharge.        Left eye: No discharge.     Conjunctiva/sclera: Conjunctivae normal.     Pupils: Pupils are equal, round, and reactive to light.  Neck:     Thyroid: No thyromegaly.     Vascular: No JVD.  Cardiovascular:     Rate and Rhythm: Regular rhythm. Tachycardia present.     Heart sounds: Normal heart sounds. No murmur heard.  No friction rub. No gallop.   Pulmonary:     Effort: Pulmonary effort is normal. No respiratory distress.     Breath sounds: Normal breath sounds. No wheezing or rales.  Abdominal:     General: Bowel sounds are normal. There is no distension.     Palpations: Abdomen is soft. There is no mass.     Tenderness: There  is no abdominal tenderness.  Musculoskeletal:        General: Swelling and tenderness present. Normal range of motion.     Cervical back: Normal range of motion and neck supple.     Left lower leg: Edema present.  Lymphadenopathy:     Cervical: No cervical adenopathy.  Skin:    General: Skin is warm and dry.     Findings: Erythema and rash present.  Neurological:     General: No focal deficit present.     Mental Status: He is alert.     Coordination: Coordination normal.  Psychiatric:        Behavior: Behavior normal.     ED Results / Procedures / Treatments   Labs (all labs ordered are listed, but only abnormal results are displayed) Labs Reviewed  LACTIC ACID, PLASMA - Abnormal; Notable for the following components:      Result Value   Lactic Acid, Venous 2.2 (*)    All other components within normal limits  COMPREHENSIVE METABOLIC  PANEL - Abnormal; Notable for the following components:   Sodium 133 (*)    Glucose, Bld 280 (*)    Calcium 8.8 (*)    Total Protein 8.5 (*)    Albumin 2.9 (*)    All other components within normal limits  CBC WITH DIFFERENTIAL/PLATELET - Abnormal; Notable for the following components:   RBC 3.20 (*)    Hemoglobin 8.5 (*)    HCT 27.8 (*)    RDW 16.0 (*)    All other components within normal limits  APTT - Abnormal; Notable for the following components:   aPTT 37 (*)    All other components within normal limits  CULTURE, BLOOD (ROUTINE X 2)  CULTURE, BLOOD (ROUTINE X 2)  URINE CULTURE  SARS CORONAVIRUS 2 BY RT PCR (HOSPITAL ORDER, [facility])  PROTIME-INR  LACTIC ACID, PLASMA  URINALYSIS, ROUTINE W REFLEX MICROSCOPIC    EKG EKG Interpretation  Date/Time:  Sunday June 21 2020 14:55:50 EDT Ventricular Rate:  108 PR Interval:  208 QRS Duration: 96 QT Interval:  322 QTC Calculation: 431 R Axis:   -61 Text Interpretation: Sinus tachycardia Left axis deviation Pulmonary disease pattern Abnormal ECG since last tracing no significant change Confirmed by Noemi Chapel [phone]) on 06/21/2020 3:01:27 PM   Radiology DG Chest 2 View  Result Date: 06/21/2020 CLINICAL DATA:  Question sepsis.  Evaluate for abnormality. EXAM: CHEST - 2 VIEW COMPARISON:  December 29, 2019 FINDINGS: No pneumothorax. The heart, hila, and mediastinum are normal. No pulmonary nodules or masses. No focal infiltrates. IMPRESSION: No active cardiopulmonary disease. Electronically Signed   By: Dorise Bullion III M.D   On: 06/21/2020 14:31   DG Tibia/Fibula Left  Result Date: 06/21/2020 CLINICAL DATA:  Leg pain. Dressing on left leg with redness and swelling. EXAM: LEFT TIBIA AND FIBULA - 2 VIEW COMPARISON:  None. FINDINGS: Edema throughout the soft tissues. No soft tissue gas identified. There is periostitis associated with the distal tibia and fibula as well as osteopenia associated  with both bones. There is also osteopenia in the posterior calcaneus, incompletely evaluated. Erosive changes seen along the posterior tibia on the lateral view. No other bony abnormalities. IMPRESSION: 1. Soft tissue edema throughout the lower leg without soft tissue gas. 2. Osteopenia in portions of the tibia, fibula, and posterior calcaneus. Apparent erosive change along the posterior tibia on the lateral view. Given the left foot partial amputation, these findings could be due  to aggressive osteoporosis from lack of mobility. Osteomyelitis not excluded on this study. If there is clinical concern for osteomyelitis in the region of the ankle and hindfoot, MRI would be more sensitive and specific. Electronically Signed   By: Dorise Bullion III M.D   On: 06/21/2020 14:34    Procedures .Critical Care Performed by: Noemi Chapel, MD Authorized by: Noemi Chapel, MD   Critical care provider statement:    Critical care time (minutes):  35   Critical care time was exclusive of:  Separately billable procedures and treating other patients and teaching time   Critical care was necessary to treat or prevent imminent or life-threatening deterioration of the following conditions:  Sepsis   Critical care was time spent personally by me on the following activities:  Blood draw for specimens, development of treatment plan with patient or surrogate, discussions with consultants, evaluation of patient's response to treatment, examination of patient, obtaining history from patient or surrogate, ordering and performing treatments and interventions, ordering and review of laboratory studies, ordering and review of radiographic studies, pulse oximetry, re-evaluation of patient's condition and review of old charts   (including critical care time)  Medications Ordered in ED Medications  lactated ringers infusion ( Intravenous New Bag/Given 06/21/20 1454)  lactated ringers bolus 1,000 mL (1,000 mLs Intravenous New  Bag/Given 06/21/20 1436)    And  lactated ringers bolus 1,000 mL (1,000 mLs Intravenous New Bag/Given 06/21/20 1437)    And  lactated ringers bolus 600 mL (has no administration in time range)  metroNIDAZOLE (FLAGYL) IVPB 500 mg (500 mg Intravenous New Bag/Given 06/21/20 1434)  vancomycin (VANCOREADY) IVPB 2000 mg/400 mL (2,000 mg Intravenous New Bag/Given 06/21/20 1439)  ceFEPIme (MAXIPIME) 2 g in sodium chloride 0.9 % 100 mL IVPB (2 g Intravenous New Bag/Given 06/21/20 1433)  acetaminophen (TYLENOL) tablet 650 mg (650 mg Oral Given 06/21/20 1440)    ED Course  I have reviewed the triage vital signs and the nursing notes.  Pertinent labs & imaging results that were available during my care of the patient were reviewed by me and considered in my medical decision making (see chart for details).    MDM Rules/Calculators/A&P                          This patient is ill-appearing, he is septic with tachycardia fever and a source of skin and soft tissue infection.  This may in fact be a deep infection given the underlying amputation and his diabetes.  Additionally because of his recurrent falls and injuries to the left chest will obtain a chest x-ray to make sure there is no rib injuries fractures or pneumothorax.  The patient will receive 30 cc/kg of lactated Ringer's.  This will be adjusted for his elevated body mass index.  Antipyretics and antibiotics.  Anticipate admission to the hospital for this critically ill-appearing septic patient  Pt has ongoing tachycardia though improving Xrays of the chest shows no fractures / PTX or pneumonia Xrays of the leg show no free air - no obvious fractures but possible osteomyelitis in the distal posterio tibia.    Lactic 2.2, glucose 280, Na 133, Cr baseline and Hgb baseline.  Will d/c Hospitalist for admission  I discussed the case with Dr. Denton Brick who will admit the patient to the hospital  Eugene Diaz was evaluated in Emergency Department on  06/21/2020 for the symptoms described in the history of present illness. He was evaluated in  the context of the global COVID-19 pandemic, which necessitated consideration that the patient might be at risk for infection with the SARS-CoV-2 virus that causes COVID-19. Institutional protocols and algorithms that pertain to the evaluation of patients at risk for COVID-19 are in a state of rapid change based on information released by regulatory bodies including the CDC and federal and state organizations. These policies and algorithms were followed during the patient's care in the ED.   Final Clinical Impression(s) / ED Diagnoses Final diagnoses:  Sepsis without acute organ dysfunction, due to unspecified organism [facility])    Rx / DC Orders ED Discharge Orders    None       Noemi Chapel, MD 06/21/20 1521

## 2020-06-21 NOTE — ED Notes (Signed)
Date and time results received: 06/21/20 1455 (use smartphrase ".now" to insert current time)  Test: Lactate Critical Value: 2.2  Name of Provider Notified: Sabra Heck

## 2020-06-21 NOTE — ED Notes (Signed)
Bed ready [facility] called

## 2020-06-21 NOTE — H&P (Addendum)
History and Physical    Eugene Diaz LGX:[id number] DOB: Jun 15, 1966 DOA: 06/21/2020  PCP: [name], PA-C   Patient coming from: Home  I have personally briefly reviewed patient's old medical records in [place]  Chief Complaint: Leg pain and swelling  HPI: Eugene Diaz is a 54 y.o. male with medical history significant for left foot osteomyelitis and amputation, diabetes mellitus, hypertension, alcoholism. Patient presented to the ED with complaints of increasing left leg redness, swelling, pain and yellowish drainage ongoing for several days to weeks.  Reports he has been treated with courses of antibiotics prescribed by his podiatrist.  But symptoms have persisted. Reports multiple falls over the past few days, hitting his head, unable to get up because he is weak and has been having difficulty ambulating. No cough or difficulty breathing, no nausea vomiting or diarrhea, no abdominal pain, no pain with urination.  ED Course: Febrile to 102.9, tachycardic to 123, blood pressure 151/81.  O2 sats 100% on room air.  WBC 8.8.  Lactic acid 2.2.  Two-view chest x-ray negative for acute abnormality.  Left tibia-fibula x-ray shows-soft tissue edema throughout the lower leg without soft tissue gas, osteopenia in tibia-fibula and posterior calcaneus.  Also possible aggressive osteoporosis versus osteomyelitis.  Further imaging with MRI recommended.  2.6 L sepsis fluid bolus given.  Broad-spectrum antibiotics IV vancomycin, cefepime and metronidazole started. Hospitalist admit for further evaluation and management.  Review of Systems: As per HPI all other systems reviewed and negative.  Past Medical History:  Diagnosis Date  . Alcohol abuse   . Alcoholic peripheral neuropathy ([place])   . Anxiety   . Arthritis   . Asthma    followed by pcp  . B12 deficiency   . Depression   . Diabetic neuropathy ([place])   . Edema of both lower extremities   . Essential tremor     neurologist-- dr patel--- due to alcohol abuse  . GERD (gastroesophageal reflux disease)   . History of cellulitis 2020   left lower leg;   recurrent 03-25-2020  . History of esophageal stricture    s/p  dilatation 02-13-2018  . History of osteomyelitis 2020   left great toe    . Hypercholesterolemia   . Hypertension    followed by pcp  (nuclear stress test 03-11-2014 low risk w/ no ischemia, ef 65%  . Normocytic anemia    followed by pcp   (03-18-2020 had transfusion's 02/ 2021)  . Peripheral vascular disease ([place])   . 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 Thrombocytopenia (HCC)    chronic  . Type 2 diabetes mellitus ([place])    followed by pcp---    (03-18-2020  pt stated does not check blood sugar)    Past Surgical History:  Procedure Laterality Date  . AMPUTATION Left 10/16/2019   Procedure: Left partial second ray resection; placement of antibiotic beads;  Surgeon: Evelina Bucy, DPM;  [facility]: [facility];  Service: Podiatry;  Laterality: Left;  . AMPUTATION Left 11/13/2019   Procedure: Left Midfoot Amputation - Transmetatarsal vs. Lisfranc; Placement antibiotic beads;  Surgeon: Evelina Bucy, DPM;  Location: [facility];  Service: Podiatry;  Laterality: Left;  .  AMPUTATION Left 12/21/2019   Procedure: Chopart Amputation left foot;  Surgeon: Evelina Bucy, DPM;  Location: [place];  Service: Podiatry;  Laterality: Left;  . AMPUTATION TOE Left 08/14/2019   Procedure: AMPUTATION GREAT TOE;  Surgeon: Evelina Bucy, DPM;  [facility]: [facility];  Service: Podiatry;  Laterality: Left;  . APPLICATION OF WOUND VAC Left 12/21/2019   Procedure: Application Of Wound Vac;  Surgeon: Evelina Bucy, DPM;  Location: [place];  Service:  Podiatry;  Laterality: Left;  . BIOPSY  02/13/2018   Procedure: BIOPSY;  Surgeon: Danie Binder, MD;  [facility];  Service: Endoscopy;;  transverse colon biopsy, gastric biopsy  . COLONOSCOPY WITH PROPOFOL N/A 02/13/2018   Procedure: COLONOSCOPY WITH PROPOFOL;  Surgeon: Danie Binder, MD;  [facility];  Service: Endoscopy;  Laterality: N/A;  11:15am  . ESOPHAGOGASTRODUODENOSCOPY (EGD) WITH PROPOFOL N/A 02/13/2018   Procedure: ESOPHAGOGASTRODUODENOSCOPY (EGD) WITH PROPOFOL;  Surgeon: Danie Binder, MD;  [facility];  Service: Endoscopy;  Laterality: N/A;  . I & D EXTREMITY Left 07/16/2019   Procedure: Insicion  AND DEBRIDEMENT EXTREMITY;  Surgeon: Evelina Bucy, DPM;  Location: [place];  Service: Podiatry;  Laterality: Left;  . INCISION AND DRAINAGE OF WOUND Left 10/13/2019   Procedure: IRRIGATION AND DEBRIDEMENT WOUND OF LEFT FOOT AND FIRST METATARSAL RESECTION;  Surgeon: Evelina Bucy, DPM;  [facility];  Service: Podiatry;  Laterality: Left;  . IRRIGATION AND DEBRIDEMENT FOOT Left 11/11/2019   Procedure: Left Foot Wound Irrigation and Debridement;  Surgeon: Evelina Bucy, DPM;  [facility];  Service: Podiatry;  Laterality: Left;  . Left arm     Left arm repair (tendon and artery)  . PERCUTANEOUS PINNING  07/16/2019   Procedure: Open Reduction Percutaneous Pinning Extremity;  Surgeon: Evelina Bucy, DPM;  Location: [place];  Service: Podiatry;;  . PILONIDAL CYST EXCISION N/A 08/08/2014   Procedure: CYST EXCISION PILONIDAL EXTENSIVE;  Surgeon: Jamesetta So, MD;  [facility];  Service: General;  Laterality: N/A;  . POLYPECTOMY  02/13/2018   Procedure: POLYPECTOMY;  Surgeon: Danie Binder, MD;  [facility];  Service: Endoscopy;;  transverse colon polyp hs, rectal polyps times 2  . SAVORY DILATION N/A 02/13/2018   Procedure: SAVORY DILATION;  Surgeon: Danie Binder, MD;  [facility];  Service: Endoscopy;   Laterality: N/A;  . WOUND DEBRIDEMENT Left 09/04/2019   Procedure: DEBRIDEMENT WOUND WITH COMPLEX  REPAIR OF DEHISCENCE;  Surgeon: Evelina Bucy, DPM;  Location: [place];  Service: Podiatry;  Laterality: Left;  Leave patient on stretcher  . WOUND DEBRIDEMENT Left 10/16/2019   Procedure: Left foot wound debridement and closure;  Surgeon: Evelina Bucy, DPM;  [facility];  Service: Podiatry;  Laterality: Left;  . WOUND DEBRIDEMENT Left 12/18/2019   Procedure: LEFT FOOT DEBRIDEMENT WITH PARTIAL INCISION OF INFECTED BONE;  Surgeon: Evelina Bucy, DPM;  Location: [place];  Service: Podiatry;  Laterality: Left;  LEFT FOOT DEBRIDEMENT WITH PARTIAL INCISION OF INFECTED BONE     reports that he quit smoking about 35 years ago. His smoking use included cigarettes. He has a 62.50 pack-year smoking history. He has never used smokeless tobacco. He reports current alcohol use of about 50.0 standard drinks of alcohol per week. He reports previous drug use. Drug: Marijuana.  No Known Allergies  Family History  Problem Relation Age of Onset  . Heart attack Mother  Living, 74  . Cancer Father        Deceased  . Healthy Brother   . Healthy Sister   . Colon cancer Neg Hx   . Colon polyps Neg Hx     Prior to Admission medications   Medication Sig Start Date End Date Taking? Authorizing Provider  acetaminophen (TYLENOL) 325 MG tablet Take 2 tablets (650 mg total) by mouth every 6 (six) hours as needed for mild pain (or Fever >/= 101). 12/27/19   Arrien, [name], MD  albuterol (PROVENTIL HFA;VENTOLIN HFA) 108 (90 Base) MCG/ACT inhaler Inhale 2 puffs into the lungs every 6 (six) hours as needed for wheezing or shortness of breath. 10/31/18   Hayden Rasmussen, MD  ALPRAZolam Duanne Moron) 1 MG tablet Take 0.5 tablets (0.5 mg total) by mouth 4 (four) times daily as needed for anxiety. 01/21/20   Caren Griffins, MD  amitriptyline (ELAVIL) 100 MG tablet Take 100 mg by mouth at  bedtime.    [provider]  atorvastatin (LIPITOR) 40 MG tablet Take 40 mg by mouth every evening.  12/17/15   [provider]  clindamycin (CLEOCIN) 300 MG capsule Take 1 capsule (300 mg total) by mouth 2 (two) times daily. 04/22/20   Evelina Bucy, DPM  diclofenac Sodium (VOLTAREN) 1 % GEL Apply 1 application topically daily as needed (pain).    [provider]  docusate sodium (COLACE) 100 MG capsule Take 100 mg by mouth daily as needed for mild constipation.    [provider]  DULoxetine (CYMBALTA) 60 MG capsule Take 60 mg by mouth daily. 11/18/19   [provider]  ferrous sulfate 325 (65 FE) MG tablet Take 1 tablet (325 mg total) by mouth daily. Patient taking differently: Take 325 mg by mouth daily.  11/15/19 11/14/20  Shelly Coss, MD  furosemide (LASIX) 20 MG tablet Take 20 mg by mouth daily as needed. 03/18/20   [provider]  gabapentin (NEURONTIN) 300 MG capsule Take 991m in the morning and at noon, then take 12037mat bedtime Patient taking differently: Take 900-1,200 mg by mouth See admin instructions. Take 3 capsules (900 mg) by mouth every morning and at noon, take 4 capsules (1200 mg) at bedtime 06/14/19   PaNarda Amber, DO  glimepiride (AMARYL) 4 MG tablet Take 4 mg by mouth daily.  08/10/19   [provider]  glycopyrrolate (ROBINUL) 1 MG tablet Take 1 mg by mouth 2 (two) times daily.  12/17/15   [provider]  hydrOXYzine (ATARAX/VISTARIL) 25 MG tablet Take 25 mg by mouth 4 (four) times daily as needed. 05/18/20   [provider]  ibuprofen (ADVIL) 400 MG tablet Take 1 tablet (400 mg total) by mouth every 8 (eight) hours as needed for moderate pain. 12/27/19   Arrien, MaJimmy PicketMD  lisinopril (PRINIVIL,ZESTRIL) 20 MG tablet Take 20 mg by mouth daily.  12/30/15   [provider]  morphine (MS CONTIN) 15 MG 12 hr tablet SMARTSIG:1 Tablet(s) By Mouth Every 12 Hours 04/22/20   [provider]  morphine (MS CONTIN) 30 MG 12 hr tablet SMARTSIG:1 Tablet(s) By Mouth Every 12 Hours 05/21/20   [provider]  omeprazole (PRILOSEC) 20 MG capsule TAKE 1 CAPSULE WITH BREAKFAST AND SUPPER. Patient taking differently: Take 20 mg by mouth 2 (two) times daily before a meal.  11/19/19   LeMahala MenghiniPA-C  oxycodone (ROXICODONE) 30 MG immediate release tablet Take 30 mg by mouth 4 (four)  times daily as needed. 05/08/20   [provider]  primidone (MYSOLINE) 50 MG tablet Take 1 tablet (50 mg total) by mouth 2 (two) times daily. 06/14/19   Alda Berthold, DO    Physical Exam: Vitals:   06/21/20 1338 06/21/20 1339  BP: (!) 151/81   Pulse: (!) 123   Resp: 18   Temp: (!) 102.9 F (39.4 C)   TempSrc: Oral   SpO2: 100%   Weight:  128.8 kg  Height:  6' 2" (1.88 m)    Constitutional: NAD, calm, comfortable Vitals:   06/21/20 1338 06/21/20 1339  BP: (!) 151/81   Pulse: (!) 123   Resp: 18   Temp: (!) 102.9 F (39.4 C)   TempSrc: Oral   SpO2: 100%   Weight:  128.8 kg  Height:  6' 2" (1.88 m)   Eyes: PERRL, lids and conjunctivae normal ENMT: Mucous membranes are moist.  Neck: normal, supple, no masses, no thyromegaly Respiratory: clear to auscultation bilaterally,  Normal respiratory effort. No accessory muscle use.  Cardiovascular: Tachycardic, regular rate and rhythm, no murmurs / rubs / gallops.  Significant swelling involving left lower extremity.  Right foot warm and appears well perfused. Abdomen: Full, no tenderness, no masses palpated. No hepatosplenomegaly. Bowel sounds positive.  Musculoskeletal: no clubbing / cyanosis.Good ROM, no contractures.  Left foot amputation..  Skin: Ulcer with minimal amount of yellowish drainage to left foot stump, see pictures below, with erythema, and differential warmth extending towards knee Neurologic: No apparent cranial nerve abnormality, moving extremities spontaneously. Psychiatric: Normal judgment and  insight. Alert and oriented x 3. Normal mood.            Labs on Admission: I have personally reviewed following labs and imaging studies  CBC: Recent Labs  Lab 06/21/20 1414  WBC 8.8  NEUTROABS 7.1  HGB 8.5*  HCT 27.8*  MCV 86.9  PLT 130   Basic Metabolic Panel: Recent Labs  Lab 06/21/20 1414  NA 133*  K 4.3  CL 102  CO2 22  GLUCOSE 280*  BUN 16  CREATININE 1.24  CALCIUM 8.8*   Liver Function Tests: Recent Labs  Lab 06/21/20 1414  AST 19  ALT 9  ALKPHOS 92  BILITOT 0.5  PROT 8.5*  ALBUMIN 2.9*   Coagulation Profile: Recent Labs  Lab 06/21/20 1414  INR 1.1    Radiological Exams on Admission: DG Chest 2 View  Result Date: 06/21/2020 CLINICAL DATA:  Question sepsis.  Evaluate for abnormality. EXAM: CHEST - 2 VIEW COMPARISON:  December 29, 2019 FINDINGS: No pneumothorax. The heart, hila, and mediastinum are normal. No pulmonary nodules or masses. No focal infiltrates. IMPRESSION: No active cardiopulmonary disease. Electronically Signed   By: Dorise Bullion III M.D   On: 06/21/2020 14:31   DG Tibia/Fibula Left  Result Date: 06/21/2020 CLINICAL DATA:  Leg pain. Dressing on left leg with redness and swelling. EXAM: LEFT TIBIA AND FIBULA - 2 VIEW COMPARISON:  None. FINDINGS: Edema throughout the soft tissues. No soft tissue gas identified. There is periostitis associated with the distal tibia and fibula as well as osteopenia associated with both bones. There is also osteopenia in the posterior calcaneus, incompletely evaluated. Erosive changes seen along the posterior tibia on the lateral view. No other bony abnormalities. IMPRESSION: 1. Soft tissue edema throughout the lower leg without soft tissue gas. 2. Osteopenia in portions of the tibia, fibula, and posterior calcaneus. Apparent erosive change along the posterior tibia on the lateral view. Given the  left foot partial amputation, these findings could be due to aggressive osteoporosis from lack of mobility.  Osteomyelitis not excluded on this study. If there is clinical concern for osteomyelitis in the region of the ankle and hindfoot, MRI would be more sensitive and specific. Electronically Signed   By: Dorise Bullion III M.D   On: 06/21/2020 14:34    EKG: Independently reviewed.  Sinus tachycardia rate 108.  QTc 431.  No significant ST-T wave changes compared to prior.  Assessment/Plan Principal Problem:   Diabetic infection of left foot (HCC) Active Problems:   ETOH abuse   Alcoholic peripheral neuropathy (HCC)   Diabetic neuropathy (HCC)   Sepsis ([place])   Depression with anxiety   Diabetic infection of the left foot-cellulitic with purulent drainage, presenting with sepsis-febrile to 102.9, tachycardic to 123, with mild lactic acidosis of 2.2  >> 1.8 after fluids.  WBC 8.8.  Left foot x-ray shows osteopenia also possible aggressive osteoporosis versus osteomyelitis, consider MRI.  Has had recurrent/persistent  infection involving same left foot, last debridement and chopart amputation 12/2019. Follows with podiatrist [name]. Prior surgical cultures grew pan sensitive staph, enterobacter cloacae and enterococcus faecalis. - Admit to [facility], talked to podiatrist Dr. Boneta Lucks who is familiar with patient, agrees with transfer.  Patient to be seen in consult tomorrow, recommends obtaining MRI. - MRI W contrast left tibia/fibula - Obtain ESR, CRP -Continue broad-spectrum antibiotics IV vancomycin cefepime and metronidazole - 2.6 L bolus, continue n/s 75cc/hr x 12 hrs -BMP, CBC in the morning -N.p.o. midnight - Will need to re-order LLE venous ultrasound, Deneise Lever penn order may not cross over. - Follow up blood cultures - UA - Positive nitrite, rare bacteria, follow up urine cultures.  Alcohol abuse- tells me he drinks 6 pack of beer daily, but also ranges from 2 bottles to 7 bottles.  Last drink was last night about 20 hours ago.  Denies history of alcohol withdrawal or  seizures.  High risk for withdrawal. -Obtain blood alcohol level -CIWA protocol PRN, and scheduled -Thiamine folate multivitamins -Check magnesium, phosphorus  Multiple falls-from inability to ambulate due to infection in left lower extremity and generalized weakness which is likely from sepsis.  Also history of alcohol abuse. -Obtain head CT-no acute CT findings.  Hypertension-systolic 962I. -Resume home lisinopril  Diabetes mellitus, with neuropathy-random glucose 280. - SSI- M -Holding home glimepiride -Obtain hemoglobin A1c - resume gabapentin  Chronic pain, depression and anxiety -Resume Oxycodone long and short acting, Cymbalta, gabapentin, as needed Ativan  DVT prophylaxis: Heparin X 1 dose ordered, pending Podiatry eval and if planned procedure tomorrow Code Status: Full code Family Communication: None at bedside Disposition Plan: > 2 days, pending treatment of cellulitis and podiatry evaluation. Consults called: Podiatry Admission status: Inpatient, telemetry I certify that at the point of admission it is my clinical judgment that the patient will require inpatient hospital care spanning beyond 2 midnights from the point of admission due to high intensity of service, high risk for further deterioration and high frequency of surveillance required. The following factors support the patient status of inpatient: Cellulitis, sepsis, from leg infection that will need evaluation and management.   Bethena Roys MD [facility]  06/21/2020, 5:16 PM

## 2020-06-21 NOTE — Progress Notes (Signed)
Pharmacy Antibiotic Note  Eugene Diaz is a 54 y.o. male admitted on 06/21/2020 with cellulitis.  Pharmacy has been consulted for Vancomycin and cefepime  Plan: Vancomycin 2000mg  loading dose, then 1000mg  IV q12h Cefepime 2gm IV q8h F/U cxs and clinical progress Monitor V/S, labs and levels as indicated  Height: 6\' 2"  (188 cm) Weight: 128.8 kg (284 lb) IBW/kg (Calculated) : 82.2  Temp (24hrs), Avg:102.9 F (39.4 C), Min:102.9 F (39.4 C), Max:102.9 F (39.4 C)  Crcl 50ml/min Normalized CrCl is 42mls/min  No Known Allergies  Antimicrobials this admission: Vancomycin 8/22>>  Cefepime 8/22>>   Dose adjustments this admission: prn  Microbiology results: 8/22 [place]; pending 8/22 UCX: TBC  Thank you for allowing pharmacy to be a part of this patient's care.  Isac Sarna, BS [place], [place] Clinical Pharmacist Pager [phone] 06/21/2020 2:11 PM

## 2020-06-22 ENCOUNTER — Inpatient Hospital Stay (HOSPITAL_COMMUNITY): Payer: Medicaid Other | Admitting: Registered Nurse

## 2020-06-22 ENCOUNTER — Encounter (HOSPITAL_COMMUNITY): Payer: Self-pay | Admitting: Internal Medicine

## 2020-06-22 ENCOUNTER — Inpatient Hospital Stay (HOSPITAL_COMMUNITY): Payer: Medicaid Other

## 2020-06-22 ENCOUNTER — Encounter (HOSPITAL_COMMUNITY)
Admission: EM | Disposition: A | Discharge status: Rehabilitation Facility | Payer: Self-pay | Source: Home / Self Care | Attending: Internal Medicine

## 2020-06-22 DIAGNOSIS — F1010 Alcohol abuse, uncomplicated: Secondary | ICD-10-CM

## 2020-06-22 DIAGNOSIS — A419 Sepsis, unspecified organism: Principal | ICD-10-CM

## 2020-06-22 DIAGNOSIS — G621 Alcoholic polyneuropathy: Secondary | ICD-10-CM

## 2020-06-22 DIAGNOSIS — F418 Other specified anxiety disorders: Secondary | ICD-10-CM

## 2020-06-22 DIAGNOSIS — E1141 Type 2 diabetes mellitus with diabetic mononeuropathy: Secondary | ICD-10-CM

## 2020-06-22 HISTORY — PX: I & D EXTREMITY: SHX5045

## 2020-06-22 LAB — URINE CULTURE: Culture: <10000 — AB

## 2020-06-22 LAB — GLUCOSE, CAPILLARY
Glucose-Capillary: 124 mg/dL — ABNORMAL HIGH (ref 70–99)
Glucose-Capillary: 125 mg/dL — ABNORMAL HIGH (ref 70–99)
Glucose-Capillary: 138 mg/dL — ABNORMAL HIGH (ref 70–99)
Glucose-Capillary: 142 mg/dL — ABNORMAL HIGH (ref 70–99)
Glucose-Capillary: 267 mg/dL — ABNORMAL HIGH (ref 70–99)

## 2020-06-22 LAB — SURGICAL PCR SCREEN
MRSA, PCR: NEGATIVE
Staphylococcus aureus: POSITIVE — AB

## 2020-06-22 SURGERY — IRRIGATION AND DEBRIDEMENT EXTREMITY
Anesthesia: Monitor Anesthesia Care | Laterality: Left

## 2020-06-22 MED ORDER — LACTATED RINGERS IV SOLN: INTRAVENOUS | Status: DC

## 2020-06-22 MED ORDER — SODIUM CHLORIDE 0.9 % IR SOLN
Status: DC | PRN
  Administered 2020-06-22: 6000 mL

## 2020-06-22 MED ORDER — FENTANYL CITRATE (PF) 250 MCG/5ML IJ SOLN
INTRAMUSCULAR | Status: AC
  Filled 2020-06-22: qty 5

## 2020-06-22 MED ORDER — CHLORHEXIDINE GLUCONATE CLOTH 2 % EX PADS
6.0000 | MEDICATED_PAD | Freq: Every day | CUTANEOUS | Status: AC
  Administered 2020-06-22 – 2020-06-25 (×4): 6 via TOPICAL

## 2020-06-22 MED ORDER — GADOBUTROL 1 MMOL/ML IV SOLN
10.0000 mL | Freq: Once | INTRAVENOUS | Status: AC | PRN
  Administered 2020-06-22: 10 mL via INTRAVENOUS

## 2020-06-22 MED ORDER — PROPOFOL 500 MG/50ML IV EMUL
INTRAVENOUS | Status: DC | PRN
  Administered 2020-06-22: 100 ug/kg/min via INTRAVENOUS

## 2020-06-22 MED ORDER — PROPOFOL 10 MG/ML IV BOLUS
INTRAVENOUS | Status: AC
  Filled 2020-06-22: qty 20

## 2020-06-22 MED ORDER — FENTANYL CITRATE (PF) 100 MCG/2ML IJ SOLN
25.0000 ug | INTRAMUSCULAR | Status: DC | PRN
  Administered 2020-06-22: 25 ug via INTRAVENOUS

## 2020-06-22 MED ORDER — DEXAMETHASONE SODIUM PHOSPHATE 10 MG/ML IJ SOLN
INTRAMUSCULAR | Status: DC | PRN
  Administered 2020-06-22: 4 mg via INTRAVENOUS

## 2020-06-22 MED ORDER — PROPOFOL 10 MG/ML IV BOLUS
INTRAVENOUS | Status: DC | PRN
  Administered 2020-06-22: 50 mg via INTRAVENOUS

## 2020-06-22 MED ORDER — ONDANSETRON HCL 4 MG/2ML IJ SOLN
INTRAMUSCULAR | Status: DC | PRN
  Administered 2020-06-22: 4 mg via INTRAVENOUS

## 2020-06-22 MED ORDER — CHLORHEXIDINE GLUCONATE 0.12 % MT SOLN: 15.0000 mL | Freq: Once | OROMUCOSAL | Status: AC

## 2020-06-22 MED ORDER — CHLORHEXIDINE GLUCONATE 0.12 % MT SOLN
OROMUCOSAL | Status: AC
  Administered 2020-06-22: 15 mL via OROMUCOSAL
  Filled 2020-06-22: qty 15

## 2020-06-22 MED ORDER — MIDAZOLAM HCL 5 MG/5ML IJ SOLN
INTRAMUSCULAR | Status: DC | PRN
  Administered 2020-06-22: 2 mg via INTRAVENOUS

## 2020-06-22 MED ORDER — BUPIVACAINE HCL (PF) 0.5 % IJ SOLN
INTRAMUSCULAR | Status: DC | PRN
  Administered 2020-06-22: 10 mL

## 2020-06-22 MED ORDER — VANCOMYCIN HCL 1000 MG IV SOLR
INTRAVENOUS | Status: AC
  Filled 2020-06-22: qty 1000

## 2020-06-22 MED ORDER — MIDAZOLAM HCL 2 MG/2ML IJ SOLN
INTRAMUSCULAR | Status: AC
  Filled 2020-06-22: qty 2

## 2020-06-22 MED ORDER — LACTATED RINGERS IV SOLN: INTRAVENOUS | Status: DC | PRN

## 2020-06-22 MED ORDER — LIDOCAINE 2% (20 MG/ML) 5 ML SYRINGE
INTRAMUSCULAR | Status: DC | PRN
  Administered 2020-06-22: 50 mg via INTRAVENOUS

## 2020-06-22 MED ORDER — ALBUTEROL SULFATE HFA 108 (90 BASE) MCG/ACT IN AERS
INHALATION_SPRAY | RESPIRATORY_TRACT | Status: DC | PRN
  Administered 2020-06-22: 4 via RESPIRATORY_TRACT

## 2020-06-22 MED ORDER — 0.9 % SODIUM CHLORIDE (POUR BTL) OPTIME
TOPICAL | Status: DC | PRN
  Administered 2020-06-22: 1000 mL

## 2020-06-22 MED ORDER — MUPIROCIN 2 % EX OINT
1.0000 "application " | TOPICAL_OINTMENT | Freq: Two times a day (BID) | CUTANEOUS | Status: AC
  Administered 2020-06-22 – 2020-06-26 (×10): 1 via NASAL
  Filled 2020-06-22 (×2): qty 22

## 2020-06-22 MED ORDER — BUPIVACAINE HCL (PF) 0.5 % IJ SOLN
INTRAMUSCULAR | Status: AC
  Filled 2020-06-22: qty 30

## 2020-06-22 MED ORDER — FENTANYL CITRATE (PF) 100 MCG/2ML IJ SOLN
INTRAMUSCULAR | Status: AC
  Filled 2020-06-22: qty 2

## 2020-06-22 SURGICAL SUPPLY — 42 items
APL PRP STRL LF DISP 70% ISPRP (MISCELLANEOUS) ×1
BNDG CMPR 9X4 STRL LF SNTH (GAUZE/BANDAGES/DRESSINGS)
BNDG ELASTIC 4X5.8 VLCR STR LF (GAUZE/BANDAGES/DRESSINGS) IMPLANT
BNDG ESMARK 4X9 LF (GAUZE/BANDAGES/DRESSINGS) IMPLANT
BNDG GAUZE ELAST 4 BULKY (GAUZE/BANDAGES/DRESSINGS) ×1 IMPLANT
CHLORAPREP W/TINT 26 (MISCELLANEOUS) ×2 IMPLANT
COVER SURGICAL LIGHT HANDLE (MISCELLANEOUS) ×2 IMPLANT
COVER WAND RF STERILE (DRAPES) ×2 IMPLANT
CUFF TOURN SGL QUICK 18X4 (TOURNIQUET CUFF) IMPLANT
CUFF TOURN SGL QUICK 34 (TOURNIQUET CUFF)
CUFF TRNQT CYL 34X4.125X (TOURNIQUET CUFF) IMPLANT
DRAPE U-SHAPE 47X51 STRL (DRAPES) ×2 IMPLANT
ELECT CAUTERY BLADE 6.4 (BLADE) ×2 IMPLANT
ELECT REM PT RETURN 9FT ADLT (ELECTROSURGICAL) ×2
ELECTRODE REM PT RTRN 9FT ADLT (ELECTROSURGICAL) ×1 IMPLANT
GAUZE PACKING IODOFORM 1/4X15 (PACKING) ×1 IMPLANT
GAUZE SPONGE 4X4 12PLY STRL (GAUZE/BANDAGES/DRESSINGS) ×1 IMPLANT
GLOVE BIO SURGEON STRL SZ7.5 (GLOVE) ×2 IMPLANT
GLOVE BIOGEL PI IND STRL 8 (GLOVE) ×1 IMPLANT
GLOVE BIOGEL PI INDICATOR 8 (GLOVE) ×1
GOWN STRL REUS W/ TWL LRG LVL3 (GOWN DISPOSABLE) ×1 IMPLANT
GOWN STRL REUS W/ TWL XL LVL3 (GOWN DISPOSABLE) ×1 IMPLANT
GOWN STRL REUS W/TWL LRG LVL3 (GOWN DISPOSABLE) ×2
GOWN STRL REUS W/TWL XL LVL3 (GOWN DISPOSABLE) ×2
KIT BASIN OR (CUSTOM PROCEDURE TRAY) ×2 IMPLANT
KIT TURNOVER KIT B (KITS) ×2 IMPLANT
MANIFOLD NEPTUNE II (INSTRUMENTS) ×2 IMPLANT
NDL BIOPSY JAMSHIDI 8X6 (NEEDLE) IMPLANT
NDL HYPO 25GX1X1/2 BEV (NEEDLE) IMPLANT
NEEDLE BIOPSY JAMSHIDI 8X6 (NEEDLE) IMPLANT
NEEDLE HYPO 25GX1X1/2 BEV (NEEDLE) IMPLANT
NS IRRIG 1000ML POUR BTL (IV SOLUTION) ×2 IMPLANT
PACK ORTHO EXTREMITY (CUSTOM PROCEDURE TRAY) ×2 IMPLANT
PAD ARMBOARD 7.5X6 YLW CONV (MISCELLANEOUS) ×4 IMPLANT
PROBE DEBRIDE SONICVAC MISONIX (TIP) IMPLANT
SET CYSTO W/LG BORE CLAMP LF (SET/KITS/TRAYS/PACK) ×2 IMPLANT
SOL PREP POV-IOD 4OZ 10% (MISCELLANEOUS) ×4 IMPLANT
SYR CONTROL 10ML LL (SYRINGE) IMPLANT
TOWEL GREEN STERILE (TOWEL DISPOSABLE) ×2 IMPLANT
TOWEL GREEN STERILE FF (TOWEL DISPOSABLE) ×2 IMPLANT
TUBE CONNECTING 12X1/4 (SUCTIONS) ×2 IMPLANT
YANKAUER SUCT BULB TIP NO VENT (SUCTIONS) ×2 IMPLANT

## 2020-06-22 NOTE — Plan of Care (Signed)
  Problem: Pain Managment: Goal: General experience of comfort will improve Outcome: Progressing   Problem: Safety: Goal: Ability to remain free from injury will improve Outcome: Progressing   Problem: Skin Integrity: Goal: Risk for impaired skin integrity will decrease Outcome: Progressing   

## 2020-06-22 NOTE — Progress Notes (Addendum)
06/21/2020- pt admitted to unit from a transfer from [facility]. Pt drowsy, easily aroused and very weak. Left leg hot to touch, red and dressing CDI.     06/22/2020 0015- Pt transported to MRI in bed with transporters with mask on. Pt voices no complaints, in and out of sleep.   06/22/2020 0145. Pt back from MRI. Placed on telemetry per order. IV restarted and IV meds given. Pt is NPO after MN per order. Continues to be drowsy, and easily aroused. He voices no complaints. RN will continue to monitor.

## 2020-06-22 NOTE — Progress Notes (Signed)
PROGRESS NOTE  Eugene Diaz GXQ:[id number] DOB: June 22, 1966 DOA: 06/21/2020 PCP: [name], PA-C   LOS: 1 day   Brief Narrative / Interim history: Eugene Diaz is a 54 y.o. male with medical history significant for left foot osteomyelitis and amputation, diabetes mellitus, hypertension, alcoholism. Patient presented to the ED with complaints of increasing left leg redness, swelling, pain and yellowish drainage ongoing for several days to weeks.  Reports he has been treated with courses of antibiotics prescribed by his podiatrist.  But symptoms have persisted.  Subjective / 24h Interval events: Complains of soreness to his left foot.  No chest pain, no shortness of breath.  Assessment & Plan: Principal Problem Sepsis due to cellulitis/osteomyelitis -patient met sepsis criteria, he is febrile to 101 this morning, tachycardic and with a source.  His lactic acid was elevated at 2.2.  He was started on broad-spectrum antibiotics with vancomycin, metronidazole and cefepime.  He underwent a MRI of the left fibula, which shows acute osteomyelitis in the left ankle and hindfoot, as well as abscess in the soft tissue and septic arthritis.  Podiatry consulted, awaiting their input.  He is n.p.o.  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  Continue gabapentin as well  CBG (last 3)  Recent Labs    06/21/20 1851 06/21/20 2351 06/22/20 0533  GLUCAP 143* 201* 142*     Scheduled Meds: . amitriptyline  100 mg Oral QHS  . atorvastatin  40 mg Oral QPM  . Chlorhexidine Gluconate Cloth  6 each Topical Q0600  . DULoxetine  60 mg Oral Daily  . folic acid  1 mg Oral Daily  . gabapentin  900  mg Oral Daily   And  . gabapentin  1,200 mg Oral QHS  . glycopyrrolate  1 mg Oral BID  . insulin aspart  0-15 Units Subcutaneous Q6H  . lisinopril  20 mg Oral QHS  . LORazepam  0-4 mg Intravenous Q6H   Followed by  . [START ON 06/23/2020] LORazepam  0-4 mg Intravenous Q12H  . morphine  30 mg Oral Q12H  . multivitamin with minerals  1 tablet Oral Daily  . mupirocin ointment  1 application Nasal BID  . pantoprazole  40 mg Oral Daily  . primidone  50 mg Oral BID  . thiamine  100 mg Oral Daily   Or  . thiamine  100 mg Intravenous Daily   Continuous Infusions: . ceFEPime (MAXIPIME) IV 2 g (06/22/20 0630)  . metronidazole 500 mg (06/22/20 0528)  . vancomycin 1,000 mg (06/22/20 0247)   PRN Meds:.acetaminophen **OR** acetaminophen, ALPRAZolam, LORazepam **OR** LORazepam, ondansetron **OR** ondansetron (ZOFRAN) IV, oxycodone, polyethylene glycol  Diet Orders (From admission, onward)    Start     Ordered   06/22/20 0001  Diet NPO time specified  Diet effective midnight        06/21/20 1857          DVT prophylaxis:      Code Status: Full Code  Family Communication: no family at bedside   Status is: Inpatient  Remains inpatient appropriate because:Inpatient level of care appropriate due to severity of illness   Dispo: The patient is from: Home              Anticipated d/c is to:  SNF              Anticipated d/c date is: > 3 days              Patient currently is not medically stable to d/c.  Consultants:  Podiatry   Procedures:  None   Microbiology  Blood cultures -   Antimicrobials: Vancomycin 8/22 >> Cefepime 8/22 >> Metronidazole 8/22 >>    Objective: Vitals:   06/21/20 2206 06/21/20 2349 06/22/20 0254 06/22/20 0803  BP: (!) 184/83 (!) 159/83 (!) 134/57 (!) 161/73  Pulse: 100 (!) 110 98 95  Resp: 20 20 19    Temp:  99.8 F (37.7 C) 99.6 F (37.6 C) (!) 101 F (38.3 C)  TempSrc:  Oral Oral Oral  SpO2: 99% 100% 94% 96%  Weight:  132 kg    Height:         Intake/Output Summary (Last 24 hours) at 06/22/2020 0918 Last data filed at 06/22/2020 0400 Gross per 24 hour  Intake 13263.01 ml  Output 450 ml  Net 12813.01 ml   Filed Weights   06/21/20 1339 06/21/20 2349  Weight: 128.8 kg 132 kg    Examination:  Constitutional: NAD, a bit emotional and tremulous Eyes: no scleral icterus ENMT: Mucous membranes are moist.  Neck: normal, supple Respiratory: clear to auscultation bilaterally, no wheezing, no crackles. Normal respiratory effort. Cardiovascular: Regular rate and rhythm, no murmurs / rubs / gallops.  Stretch Abdomen: non distended, no tenderness. Bowel sounds positive.  Musculoskeletal: no clubbing / cyanosis.  Skin: Left lower extremity cellulitic rash Neurologic: CN 2-12 grossly intact. Strength 5/5 in all 4.    Data Reviewed: I have independently reviewed following labs and imaging studies   CBC: Recent Labs  Lab 06/21/20 1414  WBC 8.8  NEUTROABS 7.1  HGB 8.5*  HCT 27.8*  MCV 86.9  PLT 426   Basic Metabolic Panel: Recent Labs  Lab 06/21/20 1414  NA 133*  K 4.3  CL 102  CO2 22  GLUCOSE 280*  BUN 16  CREATININE 1.24  CALCIUM 8.8*  MG 1.9  PHOS 2.7   Liver Function Tests: Recent Labs  Lab 06/21/20 1414  AST 19  ALT 9  ALKPHOS 92  BILITOT 0.5  PROT 8.5*  ALBUMIN 2.9*   Coagulation Profile: Recent Labs  Lab 06/21/20 1414  INR 1.1   HbA1C: No results for input(s): HGBA1C in the last 72 hours. CBG: Recent Labs  Lab 06/21/20 1851 06/21/20 2351 06/22/20 0533  GLUCAP 143* 201* 142*    Recent Results (from the past 240 hour(s))  SARS Coronavirus 2 by RT PCR (hospital order, performed in [facility] hospital lab) Nasopharyngeal Nasopharyngeal Swab     Status: None   Collection Time: 06/21/20  2:09 PM   Specimen: Nasopharyngeal Swab  Result Value Ref Range Status   SARS Coronavirus 2 NEGATIVE NEGATIVE Final    Comment: (NOTE) SARS-CoV-2 target nucleic acids are NOT DETECTED.  The  SARS-CoV-2 RNA is generally detectable in upper and lower respiratory specimens during the acute phase of infection. The lowest concentration of SARS-CoV-2 viral copies this assay can detect is 250 copies / mL. A negative result does not preclude SARS-CoV-2 infection and should not be used as the sole basis for treatment or other patient management decisions.  A negative result may occur with improper specimen collection / handling, submission of specimen other than nasopharyngeal swab, presence of viral mutation(s) within the areas targeted by this assay, and inadequate number of  viral copies (<250 copies / mL). A negative result must be combined with clinical observations, patient history, and epidemiological information.  Fact Sheet for Patients:   StrictlyIdeas.no  Fact Sheet for Healthcare Providers: BankingDealers.co.za  This test is not yet approved or  cleared by the [place] FDA and has been authorized for detection and/or diagnosis of SARS-CoV-2 by FDA under an Emergency Use Authorization (EUA).  This EUA will remain in effect (meaning this test can be used) for the duration of the COVID-19 declaration under Section 564(b)(1) of the Act, 21 U.S.C. section 360bbb-3(b)(1), unless the authorization is terminated or revoked sooner.  Performed at [facility], [address] [place], [place] [address]   Blood Culture (routine x 2)     Status: None (Preliminary result)   Collection Time: 06/21/20  2:16 PM   Specimen: Vein; Blood  Result Value Ref Range Status   Specimen Description   Final    RIGHT ANTECUBITAL BOTTLES DRAWN AEROBIC AND ANAEROBIC   Special Requests   Final    Blood Culture adequate volume Performed at [facility], [address], [place], [place] [address]    Culture PENDING  Incomplete   Report Status PENDING  Incomplete  Blood Culture (routine x 2)     Status: None (Preliminary result)   Collection Time:  06/21/20  2:16 PM   Specimen: Vein; Blood  Result Value Ref Range Status   Specimen Description   Final    LEFT ANTECUBITAL BOTTLES DRAWN AEROBIC AND ANAEROBIC   Special Requests   Final    Blood Culture adequate volume Performed at [facility], [address], [place], [place] [address]    Culture PENDING  Incomplete   Report Status PENDING  Incomplete  Surgical pcr screen     Status: Abnormal   Collection Time: 06/22/20  2:10 AM   Specimen: Nasal Mucosa; Nasal Swab  Result Value Ref Range Status   MRSA, PCR NEGATIVE NEGATIVE Final   Staphylococcus aureus POSITIVE (A) NEGATIVE Final    Comment: (NOTE) The Xpert SA Assay (FDA approved for NASAL specimens in patients 52 years of age and older), is one component of a comprehensive surveillance program. It is not intended to diagnose infection nor to guide or monitor treatment. Performed at [facility], [place] [address] [place], [place] [address]      Radiology Studies: DG Chest 2 View  Result Date: 06/21/2020 CLINICAL DATA:  Question sepsis.  Evaluate for abnormality. EXAM: CHEST - 2 VIEW COMPARISON:  December 29, 2019 FINDINGS: No pneumothorax. The heart, hila, and mediastinum are normal. No pulmonary nodules or masses. No focal infiltrates. IMPRESSION: No active cardiopulmonary disease. Electronically Signed   By: Dorise Bullion III M.D   On: 06/21/2020 14:31   DG Tibia/Fibula Left  Result Date: 06/21/2020 CLINICAL DATA:  Leg pain. Dressing on left leg with redness and swelling. EXAM: LEFT TIBIA AND FIBULA - 2 VIEW COMPARISON:  None. FINDINGS: Edema throughout the soft tissues. No soft tissue gas identified. There is periostitis associated with the distal tibia and fibula as well as osteopenia associated with both bones. There is also osteopenia in the posterior calcaneus, incompletely evaluated. Erosive changes seen along the posterior tibia on the lateral view. No other bony abnormalities. IMPRESSION: 1. Soft tissue edema  throughout the lower leg without soft tissue gas. 2. Osteopenia in portions of the tibia, fibula, and posterior calcaneus. Apparent erosive change along the posterior tibia on the lateral view. Given the left foot partial  amputation, these findings could be due to aggressive osteoporosis from lack of mobility. Osteomyelitis not excluded on this study. If there is clinical concern for osteomyelitis in the region of the ankle and hindfoot, MRI would be more sensitive and specific. Electronically Signed   By: Dorise Bullion III M.D   On: 06/21/2020 14:34   CT HEAD WO CONTRAST  Result Date: 06/21/2020 CLINICAL DATA:  Multiple falls with trauma to the head. EXAM: CT HEAD WITHOUT CONTRAST TECHNIQUE: Contiguous axial images were obtained from the base of the skull through the vertex without intravenous contrast. COMPARISON:  04/09/2020 FINDINGS: Brain: Mild age related volume loss. No evidence of old or acute focal infarction, mass lesion, hemorrhage, hydrocephalus or extra-axial collection. Vascular: There is atherosclerotic calcification of the major vessels at the base of the brain. Skull: Negative Sinuses/Orbits: Clear/normal Other: None IMPRESSION: No acute CT finding.  Mild age related volume loss. Electronically Signed   By: Nelson Chimes M.D.   On: 06/21/2020 17:01   MR TIBIA FIBULA LEFT W WO CONTRAST  Result Date: 06/22/2020 CLINICAL DATA:  Left leg pain, swelling, redness. EXAM: MRI OF LOWER LEFT EXTREMITY WITHOUT AND WITH CONTRAST TECHNIQUE: Multiplanar, multisequence MR imaging of the left tibia and fibula was performed both before and after administration of intravenous contrast. CONTRAST:  10m GADAVIST GADOBUTROL 1 MMOL/ML IV SOLN COMPARISON:  X-ray 06/21/2020 FINDINGS: Technical note: Motion degraded examination. Extensive bone marrow edema centered at the left ankle and hindfoot with confluent low T1 marrow signal involving the calcaneus, talus, navicular, as well as the distal portions of the left  tibia and fibula (series 7, images 16-25; series 14, images 71-81). Extensive bony destruction within the hindfoot. The midfoot is not well seen within the field of view, but is also likely involved. Soft tissue ulceration at the anteromedial aspect of the ankle with rim enhancing fluid collection extending to the tibiotalar and subtalar joints compatible with abscess and septic arthritis (series 34, images 19-25). Abscess measures up to 6.3 cm in maximal dimension. Additional small 1.0 cm abscess within Kager's fat (series 31, image 68). Diffuse soft tissue edema throughout the lower extremity. There is diffuse edema-like signal throughout the lower extremity musculature which may reflect myositis and/or denervation changes. Small amount of fluid tracks along the fascial margins of the lower extremity musculature within the calf. No rim enhancing fascial fluid collection. No intramuscular collection. IMPRESSION: 1. Extensive acute osteomyelitis centered at the left ankle and hindfoot, as described above. 2. Soft tissue ulceration at the anteromedial aspect of the ankle with a 6.3 cm deep soft tissue abscess and associated septic arthritis of the tibiotalar and subtalar joints. 3. Diffuse edema-like signal throughout the lower extremity musculature which may reflect myositis and/or denervation changes. Electronically Signed   By: NDavina PokeD.O.   On: 06/22/2020 08:10    CMarzetta Board MD, PhD [facility]  Between 7 am - 7 pm I am available, please contact me via Amion or Securechat  Between 7 pm - 7 am I am not available, please contact night coverage MD/APP via Amion

## 2020-06-22 NOTE — Brief Op Note (Signed)
06/22/2020  8:30 PM  PATIENT:  Eugene Diaz  54 y.o. male  PRE-OPERATIVE DIAGNOSIS:  left foot wound  POST-OPERATIVE DIAGNOSIS:  left foot wound  PROCEDURE:  Procedure(s): IRRIGATION AND DEBRIDEMENT EXTREMITY, left foot and ankle (Left)  SURGEON:  Surgeon(s) and Role:    * Evelina Bucy, DPM - Primary  PHYSICIAN ASSISTANT:   ASSISTANTS: none   ANESTHESIA:   MAC/local  EBL:  20 mL   BLOOD ADMINISTERED: none  DRAINS: none   LOCAL MEDICATIONS USED:  MARCAINE    and Amount: 10 ml  SPECIMEN:   ID Type Source Tests Collected by Time Destination  A : left foot wound Wound Foot, Left GRAM STAIN, AEROBIC/ANAEROBIC CULTURE (SURGICAL/DEEP WOUND) Evelina Bucy, DPM 06/22/2020 1944   B : BONE OF TALUS Tissue Bone FUNGUS CULTURE WITH STAIN, AEROBIC/ANAEROBIC CULTURE (SURGICAL/DEEP WOUND) Evelina Bucy, DPM 06/22/2020 2004       DISPOSITION OF SPECIMEN:  As above  COUNTS:  YES  TOURNIQUET:   Total Tourniquet Time Documented: Calf (Left) - 28 minutes Total: Calf (Left) - 28 minutes   DICTATION: .Viviann Spare Dictation  PLAN OF CARE: transfer to floor  PATIENT DISPOSITION:  PACU - hemodynamically stable.   Delay start of Pharmacological VTE agent (>24hrs) due to surgical blood loss or risk of bleeding: not applicable

## 2020-06-22 NOTE — Plan of Care (Signed)

## 2020-06-22 NOTE — Op Note (Signed)
  Patient Name: Eugene Diaz DOB: 04-15-1966  MRN: [id number]   Date of Surgery: 06/22/20  Surgeon: Dr. Hardie Pulley, DPM Assistants: None  Pre-operative Diagnosis:  Septic arthritis, osteomyelitis, abscess of foot left Post-operative Diagnosis:  Same Procedures:  1) Incision and drainage left foot below the deep fascia with extensive debridement  2) Subtalar joint arthrotomy and debridement  3) Ankle joint arthrotomy and debridement Pathology/Specimens: ID Type Source Tests Collected by Time Destination  A : left foot wound Wound Foot, Left GRAM STAIN, AEROBIC/ANAEROBIC CULTURE (SURGICAL/DEEP WOUND) Evelina Bucy, DPM 06/22/2020 1944   B : BONE OF TALUS Tissue Bone FUNGUS CULTURE WITH STAIN, AEROBIC/ANAEROBIC CULTURE (SURGICAL/DEEP WOUND) Evelina Bucy, DPM 06/22/2020 2004    Anesthesia: MAC Hemostasis:  Total Tourniquet Time Documented: Calf (Left) - 28 minutes Total: Calf (Left) - 28 minutes  Estimated Blood Loss: 20 mL Materials: * No implants in log * Medications: none Complications: none  Indications for Procedure:  This is a 54 y.o. male with a severe left foot infection with signs of septic arthritis and osteomyelitis. It was discussed he would benefit from incision and drainage for control of the infection. All risks, benefits, and alternatives of surgery discussed. No guarantees given   Procedure in Detail: Patient was identified in pre-operative holding area. Formal consent was signed and the left lower extremity was marked. Patient was brought back to the operating room. Anesthesia was induced. The extremity was prepped and draped in the usual sterile fashion. Timeout was taken to confirm patient name, laterality, and procedure prior to incision.   Attention was then directed to the left foot. There was a deep purulent ulcer at the plantar foot. This area was incised and purulence was encountered. The wound was extended and explored until the abscess cavity  was completely exposed and drained. The wound was sharply excisionally debrided with blade and rongeur. Debridement was carried down to and included bone.  The incision was continued laterally to the subtalar joint. The joint capsule was incised so as to drain purulent fluid from the joint. This was copiously lavaged with saline via pulse lavage. The wound was sharply excisionally debrided with a rongeur.  The incision was continued laterally to the ankle joint. The joint capsule was incised so as to drain purulent fluid from the joint. This was copiously lavaged with saline via pulse lavage. The wound was sharply excisionally debrided with a rongeur.  The wound was further irrigated with pulse lavage. Hemostasis was achieved with electrocautery. The wound was packed with surgical and 1 inch iodoform packing. The foot was then dressed with 4x4, kerlix, ACE bandage from stump to below the knee. Patient tolerated the procedure well.   Disposition: Following a period of post-operative monitoring, patient will be transferred back to the floor. Given the extent of the infection he would benefit from below knee amputation. He will likely need a few days for the leg cellulitis to resolve and edema to decrease.

## 2020-06-22 NOTE — Progress Notes (Signed)
When we arrived to the patients room he started asking me to look around the floor and other places in the room for a necklace.  This nurse looked over the room, checked his patient belonging bags and checked all drawers.  The necklace was not found at this time.  The OR was called to check and see if the necklace may have been left in the OR room and the OR charge says no necklace was with the patient when he arrived to the OR.  Floor RN Blanch Media notified that the necklace hasn't been located at this time.

## 2020-06-22 NOTE — Anesthesia Preprocedure Evaluation (Signed)
Anesthesia Evaluation  Patient identified by MRN, date of birth, ID band Patient awake    Reviewed: Allergy & Precautions, NPO status , Patient's Chart, lab work & pertinent test results  Airway Mallampati: II  TM Distance: >3 FB Neck ROM: Full    Dental  (+) Poor Dentition, Dental Advisory Given,  Extremely poor dentition:   Pulmonary asthma , COPD, former smoker,  Quit smoking 1985- 62.5 pack year history  Uses rescue inhaler daily- uses 3-4x/d   Pulmonary exam normal breath sounds clear to auscultation       Cardiovascular hypertension, Pt. on medications + Peripheral Vascular Disease and +CHF  Normal cardiovascular exam Rhythm:Regular Rate:Normal  ECG: SR, rate 77  Last echo 2015: Mild LVH with LVEF 55-60%, grade 2  diastolic dysfunction. Mild MAC with trivial mtiral  regurgitation. Mildly sclerotic aortic valve. Mildly  reduced RV contraction. Unable to assess PASP. No  pericardial effusion.    Neuro/Psych PSYCHIATRIC DISORDERS Anxiety Depression Essential tremor Alcohol related peripheral neuropathy  Neuromuscular disease    GI/Hepatic PUD, GERD  Medicated,(+)     substance abuse  alcohol use,   Endo/Other  negative endocrine ROSdiabetes  Renal/GU negative Renal ROS  negative genitourinary   Musculoskeletal  (+) Arthritis , narcotic dependentOsteomyelitis of left foot. Dehiscence of wound   Abdominal (+) + obese,   Peds  Hematology  (+) anemia , HLD   Anesthesia Other Findings   Reproductive/Obstetrics negative OB ROS                             Lab Results  Component Value Date   WBC 8.8 06/21/2020   HGB 8.5 (L) 06/21/2020   HCT 27.8 (L) 06/21/2020   MCV 86.9 06/21/2020   PLT 374 06/21/2020   Lab Results  Component Value Date   CREATININE 1.24 06/21/2020   BUN 16 06/21/2020   NA 133 (L) 06/21/2020   K 4.3 06/21/2020   CL 102 06/21/2020   CO2 22  06/21/2020    Anesthesia Physical  Anesthesia Plan  ASA: III  Anesthesia Plan: MAC   Post-op Pain Management:    Induction:   PONV Risk Score and Plan: 1 and Ondansetron, Treatment may vary due to age or medical condition and Propofol infusion  Airway Management Planned: Natural Airway and Simple Face Mask  Additional Equipment: None  Intra-op Plan:   Post-operative Plan:   Informed Consent: I have reviewed the patients History and Physical, chart, labs and discussed the procedure including the risks, benefits and alternatives for the proposed anesthesia with the patient or authorized representative who has indicated his/her understanding and acceptance.     Dental advisory given  Plan Discussed with: CRNA and Surgeon  Anesthesia Plan Comments:         Anesthesia Quick Evaluation

## 2020-06-22 NOTE — Anesthesia Procedure Notes (Signed)
Procedure Name: MAC Date/Time: 06/22/2020 7:20 PM Performed by: Jearld Pies, CRNA Pre-anesthesia Checklist: Patient identified, Emergency Drugs available, Suction available and Patient being monitored Patient Re-evaluated:Patient Re-evaluated prior to induction Oxygen Delivery Method: Simple face mask Preoxygenation: Pre-oxygenation with 100% oxygen

## 2020-06-22 NOTE — Transfer of Care (Signed)
Immediate Anesthesia Transfer of Care Note  Patient: Eugene Diaz  Procedure(s) Performed: IRRIGATION AND DEBRIDEMENT EXTREMITY, left foot and ankle (Left )  Patient Location: ICU  Anesthesia Type:MAC  Level of Consciousness: drowsy, patient cooperative and responds to stimulation  Airway & Oxygen Therapy: Patient Spontanous Breathing  Post-op Assessment: Report given to RN and Post -op Vital signs reviewed and stable  Post vital signs: Reviewed and stable  Last Vitals:  Vitals Value Taken Time  BP 128/77   Temp    Pulse 91 06/22/20 2029  Resp 12 06/22/20 2029  SpO2 99 % 06/22/20 2029  Vitals shown include unvalidated device data.  Last Pain:  Vitals:   06/22/20 1652  TempSrc: Oral  PainSc:          Complications: No complications documented.

## 2020-06-22 NOTE — Consult Note (Signed)
Reason for Consult: Left foot infection Referring Physician: Ivis Henneman is an 54 y.o. male.  HPI: 54 year old male well known to [facility] previously s/p Chopart amputation for osteomyelitis. Patient reports that his wound has been worsening but did not seek care at the office. Was transferred from [facility] after going there for increased redness, swelling and drainage to the left foot that has persisted for several days.  Has been NPO all day today.  Past Medical History:  Diagnosis Date  . Alcohol abuse   . Alcoholic peripheral neuropathy ([place])   . Anxiety   . Arthritis   . Asthma    followed by pcp  . B12 deficiency   . Depression   . Diabetic neuropathy ([place])   . Edema of both lower extremities   . Essential tremor    neurologist-- dr patel--- due to alcohol abuse  . GERD (gastroesophageal reflux disease)   . History of cellulitis 2020   left lower leg;   recurrent 03-25-2020  . History of esophageal stricture    s/p  dilatation 02-13-2018  . History of osteomyelitis 2020   left great toe    . Hypercholesterolemia   . Hypertension    followed by pcp  (nuclear stress test 03-11-2014 low risk w/ no ischemia, ef 65%  . Normocytic anemia    followed by pcp   (03-18-2020 had transfusion's 02/ 2021)  . Peripheral vascular disease ([place])   . 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 Thrombocytopenia (HCC)    chronic  . Type 2 diabetes mellitus ([place])    followed by pcp---    (03-18-2020  pt stated does not check blood sugar)    Past Surgical History:  Procedure Laterality Date  . AMPUTATION Left 10/16/2019   Procedure: Left partial second ray resection; placement of antibiotic beads;  Surgeon: Evelina Bucy, DPM;  [facility];  Service: Podiatry;  Laterality: Left;  . AMPUTATION Left 11/13/2019   Procedure: Left Midfoot Amputation - Transmetatarsal vs. Lisfranc; Placement antibiotic beads;  Surgeon: Evelina Bucy, DPM;  [facility];  Service: Podiatry;  Laterality: Left;  . AMPUTATION Left 12/21/2019   Procedure: Chopart Amputation left foot;  Surgeon: Evelina Bucy, DPM;  Location: [place];  Service: Podiatry;  Laterality: Left;  . AMPUTATION TOE Left 08/14/2019   Procedure: AMPUTATION GREAT TOE;  Surgeon: Evelina Bucy, DPM;  [facility];  Service: Podiatry;  Laterality: Left;  . APPLICATION OF WOUND VAC Left 12/21/2019   Procedure: Application Of Wound Vac;  Surgeon: Evelina Bucy, DPM;  Location: [place];  Service: Podiatry;  Laterality: Left;  . BIOPSY  02/13/2018   Procedure: BIOPSY;  Surgeon: Danie Binder, MD;  [facility];  Service: Endoscopy;;  transverse colon biopsy, gastric biopsy  . COLONOSCOPY WITH PROPOFOL N/A 02/13/2018   Procedure: COLONOSCOPY WITH PROPOFOL;  Surgeon: Danie Binder, MD;  [facility];  Service: Endoscopy;  Laterality: N/A;  11:15am  . ESOPHAGOGASTRODUODENOSCOPY (EGD) WITH PROPOFOL N/A 02/13/2018   Procedure: ESOPHAGOGASTRODUODENOSCOPY (EGD) WITH PROPOFOL;  Surgeon: Danie Binder, MD;  [facility];  Service: Endoscopy;  Laterality: N/A;  . I & D EXTREMITY Left 07/16/2019   Procedure: Insicion  AND DEBRIDEMENT EXTREMITY;  Surgeon: Evelina Bucy, DPM;  Location: [place];  Service: Podiatry;  Laterality: Left;  . INCISION AND DRAINAGE OF WOUND Left 10/13/2019   Procedure: IRRIGATION AND DEBRIDEMENT WOUND OF LEFT FOOT AND FIRST METATARSAL RESECTION;  Surgeon: Evelina Bucy, DPM;  [facility];  Service: Podiatry;  Laterality: Left;  . IRRIGATION AND DEBRIDEMENT FOOT Left 11/11/2019   Procedure: Left Foot Wound Irrigation and Debridement;  Surgeon: Evelina Bucy, DPM;  [facility];   Service: Podiatry;  Laterality: Left;  . Left arm     Left arm repair (tendon and artery)  . PERCUTANEOUS PINNING  07/16/2019   Procedure: Open Reduction Percutaneous Pinning Extremity;  Surgeon: Evelina Bucy, DPM;  Location: [place];  Service: Podiatry;;  . PILONIDAL CYST EXCISION N/A 08/08/2014   Procedure: CYST EXCISION PILONIDAL EXTENSIVE;  Surgeon: Jamesetta So, MD;  [facility];  Service: General;  Laterality: N/A;  . POLYPECTOMY  02/13/2018   Procedure: POLYPECTOMY;  Surgeon: Danie Binder, MD;  [facility];  Service: Endoscopy;;  transverse colon polyp hs, rectal polyps times 2  . SAVORY DILATION N/A 02/13/2018   Procedure: SAVORY DILATION;  Surgeon: Danie Binder, MD;  [facility];  Service: Endoscopy;  Laterality: N/A;  . WOUND DEBRIDEMENT Left 09/04/2019   Procedure: DEBRIDEMENT WOUND WITH COMPLEX  REPAIR OF DEHISCENCE;  Surgeon: Evelina Bucy, DPM;  Location: [place];  Service: Podiatry;  Laterality: Left;  Leave patient on stretcher  . WOUND DEBRIDEMENT Left 10/16/2019   Procedure: Left foot wound debridement and closure;  Surgeon: Evelina Bucy, DPM;  [facility];  Service: Podiatry;  Laterality: Left;  . WOUND DEBRIDEMENT Left 12/18/2019   Procedure: LEFT FOOT DEBRIDEMENT WITH PARTIAL INCISION OF INFECTED BONE;  Surgeon: Evelina Bucy, DPM;  Location: [place];  Service: Podiatry;  Laterality: Left;  LEFT FOOT DEBRIDEMENT WITH PARTIAL INCISION OF INFECTED BONE    Family History  Problem Relation Age of Onset  . Heart attack Mother        Living, 14  . Cancer Father        Deceased  . Healthy Brother   . Healthy Sister   . Colon cancer Neg Hx   . Colon polyps Neg Hx     Social History:  reports that he quit smoking about 35 years ago. His smoking use included cigarettes. He has a 62.50 pack-year smoking history. He has never used smokeless tobacco. He reports current alcohol use of about 50.0 standard drinks of  alcohol per week. He reports previous drug use. Drug: Marijuana.  Allergies: No Known Allergies  Medications: I have reviewed the patient's current medications.  Results for orders placed or performed during the hospital encounter of 06/21/20 (from the past 48 hour(s))  SARS Coronavirus 2 by RT PCR (hospital order, performed in [facility] hospital lab) Nasopharyngeal Nasopharyngeal Swab     Status: None   Collection Time: 06/21/20  2:09 PM   Specimen: Nasopharyngeal Swab  Result Value Ref Range   SARS Coronavirus 2 NEGATIVE NEGATIVE    Comment: (NOTE) SARS-CoV-2 target nucleic acids are NOT DETECTED.  The SARS-CoV-2 RNA is generally detectable in upper and lower respiratory specimens during the acute phase of infection. The lowest concentration of SARS-CoV-2 viral copies this assay can detect is 250 copies / mL. A negative result  does not preclude SARS-CoV-2 infection and should not be used as the sole basis for treatment or other patient management decisions.  A negative result may occur with improper specimen collection / handling, submission of specimen other than nasopharyngeal swab, presence of viral mutation(s) within the areas targeted by this assay, and inadequate number of viral copies (<250 copies / mL). A negative result must be combined with clinical observations, patient history, and epidemiological information.  Fact Sheet for Patients:   StrictlyIdeas.no  Fact Sheet for Healthcare Providers: BankingDealers.co.za  This test is not yet approved or  cleared by the [place] FDA and has been authorized for detection and/or diagnosis of SARS-CoV-2 by FDA under an Emergency Use Authorization (EUA).  This EUA will remain in effect (meaning this test can be used) for the duration of the COVID-19 declaration under Section 564(b)(1) of the Act, 21 U.S.C. section 360bbb-3(b)(1), unless the authorization is terminated  or revoked sooner.  Performed at [facility], [address] [place], [place] [address]   Lactic acid, plasma     Status: Abnormal   Collection Time: 06/21/20  2:14 PM  Result Value Ref Range   Lactic Acid, Venous 2.2 (HH) 0.5 - 1.9 mmol/L    Comment: CRITICAL RESULT CALLED TO, READ BACK BY AND VERIFIED WITH: TRAVIS DEVOLT,RN @1451  06/21/2020 KAY Performed at [facility], [address] [place], [place] [address]   Comprehensive metabolic panel     Status: Abnormal   Collection Time: 06/21/20  2:14 PM  Result Value Ref Range   Sodium 133 (L) 135 - 145 mmol/L   Potassium 4.3 3.5 - 5.1 mmol/L   Chloride 102 98 - 111 mmol/L   CO2 22 22 - 32 mmol/L   Glucose, Bld 280 (H) 70 - 99 mg/dL    Comment: Glucose reference range applies only to samples taken after fasting for at least 8 hours.   BUN 16 6 - 20 mg/dL   Creatinine, Ser 1.24 0.61 - 1.24 mg/dL   Calcium 8.8 (L) 8.9 - 10.3 mg/dL   Total Protein 8.5 (H) 6.5 - 8.1 g/dL   Albumin 2.9 (L) 3.5 - 5.0 g/dL   AST 19 15 - 41 U/L   ALT 9 0 - 44 U/L   Alkaline Phosphatase 92 38 - 126 U/L   Total Bilirubin 0.5 0.3 - 1.2 mg/dL   GFR calc non Af Amer >60 >60 mL/min   GFR calc Af Amer >60 >60 mL/min   Anion gap 9 5 - 15    Comment: Performed at [facility], [address] [place], [place] [address]  CBC WITH DIFFERENTIAL     Status: Abnormal   Collection Time: 06/21/20  2:14 PM  Result Value Ref Range   WBC 8.8 4.0 - 10.5 K/uL   RBC 3.20 (L) 4.22 - 5.81 MIL/uL   Hemoglobin 8.5 (L) 13.0 - 17.0 g/dL   HCT 27.8 (L) 39 - 52 %   MCV 86.9 80.0 - 100.0 fL   MCH 26.6 26.0 - 34.0 pg   MCHC 30.6 30.0 - 36.0 g/dL   RDW 16.0 (H) 11.5 - 15.5 %   Platelets 374 150 - 400 K/uL   nRBC 0.0 0.0 - 0.2 %   Neutrophils Relative % 80 %   Neutro Abs 7.1 1.7 - 7.7 K/uL   Lymphocytes Relative 11 %   Lymphs Abs 1.0 0.7 - 4.0 K/uL   Monocytes Relative 6 %   Monocytes Absolute 0.6 0 - 1 K/uL  Eosinophils Relative 1 %   Eosinophils Absolute 0.1 0 - 0  K/uL   Basophils Relative 1 %   Basophils Absolute 0.0 0 - 0 K/uL   Immature Granulocytes 1 %   Abs Immature Granulocytes 0.07 0.00 - 0.07 K/uL    Comment: Performed at [facility], [address], [place], [place] [address]  Protime-INR     Status: None   Collection Time: 06/21/20  2:14 PM  Result Value Ref Range   Prothrombin Time 13.9 11.4 - 15.2 seconds   INR 1.1 0.8 - 1.2    Comment: (NOTE) INR goal varies based on device and disease states. Performed at [facility], [address], [place], [place] [address]   APTT     Status: Abnormal   Collection Time: 06/21/20  2:14 PM  Result Value Ref Range   aPTT 37 (H) 24 - 36 seconds    Comment:        IF BASELINE aPTT IS ELEVATED, SUGGEST PATIENT RISK ASSESSMENT BE USED TO DETERMINE APPROPRIATE ANTICOAGULANT THERAPY. Performed at [facility], [address], [place], [place] [address]   Magnesium     Status: None   Collection Time: 06/21/20  2:14 PM  Result Value Ref Range   Magnesium 1.9 1.7 - 2.4 mg/dL    Comment: Performed at [facility], [address], [place], [place] [address]  Phosphorus     Status: None   Collection Time: 06/21/20  2:14 PM  Result Value Ref Range   Phosphorus 2.7 2.5 - 4.6 mg/dL    Comment: Performed at [facility], [address], [place], [place] [address]  Sedimentation rate     Status: Abnormal   Collection Time: 06/21/20  2:14 PM  Result Value Ref Range   Sed Rate 120 (H) 0 - 16 mm/hr    Comment: Performed at [facility], [address] [place], [place] [address]  C-reactive protein     Status: Abnormal   Collection Time: 06/21/20  2:14 PM  Result Value Ref Range   CRP 20.9 (H) <1.0 mg/dL    Comment: Performed at [facility], [address] [place], [place] [address]  Blood Culture (routine x 2)     Status: None (Preliminary result)   Collection Time: 06/21/20  2:16 PM   Specimen: Vein; Blood  Result Value Ref Range   Specimen Description      RIGHT ANTECUBITAL BOTTLES DRAWN AEROBIC  AND ANAEROBIC   Special Requests      Blood Culture adequate volume Performed at [facility], [address] [place], [place] [address]    Culture PENDING    Report Status PENDING   Blood Culture (routine x 2)     Status: None (Preliminary result)   Collection Time: 06/21/20  2:16 PM   Specimen: Vein; Blood  Result Value Ref Range   Specimen Description      LEFT ANTECUBITAL BOTTLES DRAWN AEROBIC AND ANAEROBIC   Special Requests      Blood Culture adequate volume Performed at [facility], [address] [place], [place] [address]    Culture PENDING    Report Status PENDING   Lactic acid, plasma     Status: None   Collection Time: 06/21/20  3:59 PM  Result Value Ref Range   Lactic Acid, Venous 1.8 0.5 - 1.9 mmol/L    Comment: Performed at [facility], [address] [place],  [address]  Urinalysis, Routine w reflex microscopic Urine, Random  Status: Abnormal   Collection Time: 06/21/20  5:12 PM  Result Value Ref Range   Color, Urine YELLOW YELLOW   APPearance CLEAR CLEAR   Specific Gravity, Urine 1.014 1.005 - 1.030   pH 5.0 5.0 - 8.0   Glucose, UA 150 (A) NEGATIVE mg/dL   Hgb urine dipstick LARGE (A) NEGATIVE   Bilirubin Urine NEGATIVE NEGATIVE   Ketones, ur NEGATIVE NEGATIVE mg/dL   Protein, ur 100 (A) NEGATIVE mg/dL   Nitrite POSITIVE (A) NEGATIVE   Leukocytes,Ua NEGATIVE NEGATIVE   RBC / HPF 11-20 0 - 5 RBC/hpf   WBC, UA 6-10 0 - 5 WBC/hpf   Bacteria, UA RARE (A) NONE SEEN   Mucus PRESENT    Hyaline Casts, UA PRESENT     Comment: Performed at [facility], [address], [place], [place] [address]  CBG monitoring, ED     Status: Abnormal   Collection Time: 06/21/20  6:51 PM  Result Value Ref Range   Glucose-Capillary 143 (H) 70 - 99 mg/dL    Comment: Glucose reference range applies only to samples taken after fasting for at least 8 hours.  Glucose, capillary     Status: Abnormal   Collection Time: 06/21/20 11:51 PM  Result Value Ref Range    Glucose-Capillary 201 (H) 70 - 99 mg/dL    Comment: Glucose reference range applies only to samples taken after fasting for at least 8 hours.  Surgical pcr screen     Status: Abnormal   Collection Time: 06/22/20  2:10 AM   Specimen: Nasal Mucosa; Nasal Swab  Result Value Ref Range   MRSA, PCR NEGATIVE NEGATIVE   Staphylococcus aureus POSITIVE (A) NEGATIVE    Comment: (NOTE) The Xpert SA Assay (FDA approved for NASAL specimens in patients 75 years of age and older), is one component of a comprehensive surveillance program. It is not intended to diagnose infection nor to guide or monitor treatment. Performed at [facility], [place] [address], [place], [place] [address]   Glucose, capillary     Status: Abnormal   Collection Time: 06/22/20  5:33 AM  Result Value Ref Range   Glucose-Capillary 142 (H) 70 - 99 mg/dL    Comment: Glucose reference range applies only to samples taken after fasting for at least 8 hours.  Glucose, capillary     Status: Abnormal   Collection Time: 06/22/20 11:41 AM  Result Value Ref Range   Glucose-Capillary 138 (H) 70 - 99 mg/dL    Comment: Glucose reference range applies only to samples taken after fasting for at least 8 hours.  Glucose, capillary     Status: Abnormal   Collection Time: 06/22/20  6:10 PM  Result Value Ref Range   Glucose-Capillary 125 (H) 70 - 99 mg/dL    Comment: Glucose reference range applies only to samples taken after fasting for at least 8 hours.    DG Chest 2 View  Result Date: 06/21/2020 CLINICAL DATA:  Question sepsis.  Evaluate for abnormality. EXAM: CHEST - 2 VIEW COMPARISON:  December 29, 2019 FINDINGS: No pneumothorax. The heart, hila, and mediastinum are normal. No pulmonary nodules or masses. No focal infiltrates. IMPRESSION: No active cardiopulmonary disease. Electronically Signed   By: Dorise Bullion III M.D   On: 06/21/2020 14:31   DG Tibia/Fibula Left  Result Date: 06/21/2020 CLINICAL DATA:  Leg pain. Dressing on  left leg with redness and swelling. EXAM: LEFT TIBIA AND FIBULA - 2 VIEW COMPARISON:  None. FINDINGS: Edema throughout the  soft tissues. No soft tissue gas identified. There is periostitis associated with the distal tibia and fibula as well as osteopenia associated with both bones. There is also osteopenia in the posterior calcaneus, incompletely evaluated. Erosive changes seen along the posterior tibia on the lateral view. No other bony abnormalities. IMPRESSION: 1. Soft tissue edema throughout the lower leg without soft tissue gas. 2. Osteopenia in portions of the tibia, fibula, and posterior calcaneus. Apparent erosive change along the posterior tibia on the lateral view. Given the left foot partial amputation, these findings could be due to aggressive osteoporosis from lack of mobility. Osteomyelitis not excluded on this study. If there is clinical concern for osteomyelitis in the region of the ankle and hindfoot, MRI would be more sensitive and specific. Electronically Signed   By: Dorise Bullion III M.D   On: 06/21/2020 14:34   CT HEAD WO CONTRAST  Result Date: 06/21/2020 CLINICAL DATA:  Multiple falls with trauma to the head. EXAM: CT HEAD WITHOUT CONTRAST TECHNIQUE: Contiguous axial images were obtained from the base of the skull through the vertex without intravenous contrast. COMPARISON:  04/09/2020 FINDINGS: Brain: Mild age related volume loss. No evidence of old or acute focal infarction, mass lesion, hemorrhage, hydrocephalus or extra-axial collection. Vascular: There is atherosclerotic calcification of the major vessels at the base of the brain. Skull: Negative Sinuses/Orbits: Clear/normal Other: None IMPRESSION: No acute CT finding.  Mild age related volume loss. Electronically Signed   By: Nelson Chimes M.D.   On: 06/21/2020 17:01   MR TIBIA FIBULA LEFT W WO CONTRAST  Result Date: 06/22/2020 CLINICAL DATA:  Left leg pain, swelling, redness. EXAM: MRI OF LOWER LEFT EXTREMITY WITHOUT AND WITH  CONTRAST TECHNIQUE: Multiplanar, multisequence MR imaging of the left tibia and fibula was performed both before and after administration of intravenous contrast. CONTRAST:  77mL GADAVIST GADOBUTROL 1 MMOL/ML IV SOLN COMPARISON:  X-ray 06/21/2020 FINDINGS: Technical note: Motion degraded examination. Extensive bone marrow edema centered at the left ankle and hindfoot with confluent low T1 marrow signal involving the calcaneus, talus, navicular, as well as the distal portions of the left tibia and fibula (series 7, images 16-25; series 14, images 71-81). Extensive bony destruction within the hindfoot. The midfoot is not well seen within the field of view, but is also likely involved. Soft tissue ulceration at the anteromedial aspect of the ankle with rim enhancing fluid collection extending to the tibiotalar and subtalar joints compatible with abscess and septic arthritis (series 34, images 19-25). Abscess measures up to 6.3 cm in maximal dimension. Additional small 1.0 cm abscess within Kager's fat (series 31, image 68). Diffuse soft tissue edema throughout the lower extremity. There is diffuse edema-like signal throughout the lower extremity musculature which may reflect myositis and/or denervation changes. Small amount of fluid tracks along the fascial margins of the lower extremity musculature within the calf. No rim enhancing fascial fluid collection. No intramuscular collection. IMPRESSION: 1. Extensive acute osteomyelitis centered at the left ankle and hindfoot, as described above. 2. Soft tissue ulceration at the anteromedial aspect of the ankle with a 6.3 cm deep soft tissue abscess and associated septic arthritis of the tibiotalar and subtalar joints. 3. Diffuse edema-like signal throughout the lower extremity musculature which may reflect myositis and/or denervation changes. Electronically Signed   By: Davina Poke D.O.   On: 06/22/2020 08:10    ROS Blood pressure (!) 156/70, pulse 97, temperature  100.3 F (37.9 C), temperature source Oral, resp. rate 16, height 6\' 2"  (1.88  m), weight 132 kg, SpO2 96 %.  Vitals:   06/22/20 1300 06/22/20 1652  BP: (!) 155/74 (!) 156/70  Pulse: 99 97  Resp:  16  Temp:  100.3 F (37.9 C)  SpO2:  96%    General AA&O x3. Toxic appearing.  Vascular Dorsalis pedis and posterior tibial pulses palpable  Neurologic Epicritic sensation grossly absent.  Dermatologic (Wound) Left foot and leg edematous, fluctuant with warmth and cellulitis. Draining purulence plantarly and anteriorly. Previous chopart amputation  Orthopedic: Motor intact BLE.    Assessment/Plan:  Left foot abscess, osteomyelitis, septic joint -Imaging: Studies independently reviewed. Multiple concerning issues including large abscess, osteomyelitis, septic arthritis of ankle and subtalar joint. -Antibiotics: Continue empirics -WB Status: NWB LLE -Surgical Plan: OR tonight for incision and drainage of left foot and ankle. -Left leg very edematous and cellulitic with purulent draining ulcer distally. Discussed high likelihood of limb amputation given extent of OM and septic arthritis. He is toxic appearing though his labs are improving, most recent lactate at 1.8 and without elevated WBC. Discussed with patient proceeding with debridement and irrigation for source control of infection. Should operative findings warrant he may need below knee amputation.  Evelina Bucy 06/22/2020, 6:40 PM   Best available via secure chat for questions or concerns.

## 2020-06-23 ENCOUNTER — Other Ambulatory Visit: Payer: Self-pay | Admitting: Physician Assistant

## 2020-06-23 ENCOUNTER — Encounter (HOSPITAL_COMMUNITY): Payer: Medicaid Other

## 2020-06-23 ENCOUNTER — Ambulatory Visit (INDEPENDENT_AMBULATORY_CARE_PROVIDER_SITE_OTHER): Payer: Medicaid Other | Admitting: Podiatry

## 2020-06-23 ENCOUNTER — Encounter (HOSPITAL_COMMUNITY): Payer: Self-pay | Admitting: Podiatry

## 2020-06-23 DIAGNOSIS — Z5329 Procedure and treatment not carried out because of patient's decision for other reasons: Secondary | ICD-10-CM

## 2020-06-23 DIAGNOSIS — E43 Unspecified severe protein-calorie malnutrition: Secondary | ICD-10-CM | POA: Insufficient documentation

## 2020-06-23 LAB — CBC
HCT: 21.8 % — ABNORMAL LOW (ref 39.0–52.0)
Hemoglobin: 6.6 g/dL — CL (ref 13.0–17.0)
MCH: 25.8 pg — ABNORMAL LOW (ref 26.0–34.0)
MCHC: 30.3 g/dL (ref 30.0–36.0)
MCV: 85.2 fL (ref 80.0–100.0)
Platelets: 264 10*3/uL (ref 150–400)
RBC: 2.56 MIL/uL — ABNORMAL LOW (ref 4.22–5.81)
RDW: 15.8 % — ABNORMAL HIGH (ref 11.5–15.5)
WBC: 6.9 10*3/uL (ref 4.0–10.5)
nRBC: 0 % (ref 0.0–0.2)

## 2020-06-23 LAB — COMPREHENSIVE METABOLIC PANEL
ALT: 9 U/L (ref 0–44)
AST: 11 U/L — ABNORMAL LOW (ref 15–41)
Albumin: 1.9 g/dL — ABNORMAL LOW (ref 3.5–5.0)
Alkaline Phosphatase: 65 U/L (ref 38–126)
Anion gap: 7 (ref 5–15)
BUN: 11 mg/dL (ref 6–20)
CO2: 24 mmol/L (ref 22–32)
Calcium: 8.6 mg/dL — ABNORMAL LOW (ref 8.9–10.3)
Chloride: 103 mmol/L (ref 98–111)
Creatinine, Ser: 1.07 mg/dL (ref 0.61–1.24)
GFR calc Af Amer: >60 mL/min (ref >60)
GFR calc non Af Amer: >60 mL/min (ref >60)
Glucose, Bld: 285 mg/dL — ABNORMAL HIGH (ref 70–99)
Potassium: 5.2 mmol/L — ABNORMAL HIGH (ref 3.5–5.1)
Sodium: 134 mmol/L — ABNORMAL LOW (ref 135–145)
Total Bilirubin: 0.2 mg/dL — ABNORMAL LOW (ref 0.3–1.2)
Total Protein: 6.6 g/dL (ref 6.5–8.1)

## 2020-06-23 LAB — HEMOGLOBIN A1C
Hgb A1c MFr Bld: 6 % — ABNORMAL HIGH (ref 4.8–5.6)
Mean Plasma Glucose: 126 mg/dL

## 2020-06-23 LAB — PREPARE RBC (CROSSMATCH)

## 2020-06-23 LAB — GLUCOSE, CAPILLARY
Glucose-Capillary: 261 mg/dL — ABNORMAL HIGH (ref 70–99)
Glucose-Capillary: 266 mg/dL — ABNORMAL HIGH (ref 70–99)
Glucose-Capillary: 282 mg/dL — ABNORMAL HIGH (ref 70–99)
Glucose-Capillary: 308 mg/dL — ABNORMAL HIGH (ref 70–99)
Glucose-Capillary: 368 mg/dL — ABNORMAL HIGH (ref 70–99)

## 2020-06-23 LAB — PHOSPHORUS: Phosphorus: 4.3 mg/dL (ref 2.5–4.6)

## 2020-06-23 LAB — MAGNESIUM: Magnesium: 1.7 mg/dL (ref 1.7–2.4)

## 2020-06-23 MED ORDER — DEXTROSE 5 % IV SOLN
3.0000 g | INTRAVENOUS | Status: AC
  Administered 2020-06-24: 3 g via INTRAVENOUS
  Filled 2020-06-23 (×2): qty 3000

## 2020-06-23 MED ORDER — SODIUM CHLORIDE 0.9% IV SOLUTION: Freq: Once | INTRAVENOUS | Status: AC

## 2020-06-23 NOTE — Progress Notes (Addendum)
Subjective:  Patient ID: Eugene Diaz, male    DOB: January 03, 1966,  MRN: [id number]  Patient seen bedside. Looking better today, states he feels better than he does yesterday. Objective:   Vitals:   06/23/20 0300 06/23/20 0741  BP: 136/86 108/72  Pulse: 83 80  Resp: 18 18  Temp: 98.1 F (36.7 C) 98.1 F (36.7 C)  SpO2: 97% 96%   General AA&O x3. Looks and sounds weak but improved c/t yesterday.  Vascular Dorsalis pedis and posterior tibial pulses 2/4 bilat. Brisk capillary refill to all digits. Pedal hair present.  Neurologic Epicritic sensation grossly intact.  Dermatologic Cellulitis left leg receding.  Orthopedic: POP left foot. Hindfoot partial amputation.   Results for orders placed or performed during the hospital encounter of 06/21/20 (from the past 24 hour(s))  Glucose, capillary     Status: Abnormal   Collection Time: 06/22/20 11:41 AM  Result Value Ref Range   Glucose-Capillary 138 (H) 70 - 99 mg/dL  Glucose, capillary     Status: Abnormal   Collection Time: 06/22/20  6:10 PM  Result Value Ref Range   Glucose-Capillary 125 (H) 70 - 99 mg/dL  Glucose, capillary     Status: Abnormal   Collection Time: 06/22/20  8:31 PM  Result Value Ref Range   Glucose-Capillary 124 (H) 70 - 99 mg/dL  Glucose, capillary     Status: Abnormal   Collection Time: 06/22/20 11:49 PM  Result Value Ref Range   Glucose-Capillary 267 (H) 70 - 99 mg/dL  Comprehensive metabolic panel     Status: Abnormal   Collection Time: 06/23/20  5:39 AM  Result Value Ref Range   Sodium 134 (L) 135 - 145 mmol/L   Potassium 5.2 (H) 3.5 - 5.1 mmol/L   Chloride 103 98 - 111 mmol/L   CO2 24 22 - 32 mmol/L   Glucose, Bld 285 (H) 70 - 99 mg/dL   BUN 11 6 - 20 mg/dL   Creatinine, Ser 1.07 0.61 - 1.24 mg/dL   Calcium 8.6 (L) 8.9 - 10.3 mg/dL   Total Protein 6.6 6.5 - 8.1 g/dL   Albumin 1.9 (L) 3.5 - 5.0 g/dL   AST 11 (L) 15 - 41 U/L   ALT 9 0 - 44 U/L   Alkaline Phosphatase 65 38 - 126 U/L   Total  Bilirubin 0.2 (L) 0.3 - 1.2 mg/dL   GFR calc non Af Amer >60 >60 mL/min   GFR calc Af Amer >60 >60 mL/min   Anion gap 7 5 - 15  CBC     Status: Abnormal   Collection Time: 06/23/20  5:39 AM  Result Value Ref Range   WBC 6.9 4.0 - 10.5 K/uL   RBC 2.56 (L) 4.22 - 5.81 MIL/uL   Hemoglobin 6.6 (LL) 13.0 - 17.0 g/dL   HCT 21.8 (L) 39 - 52 %   MCV 85.2 80.0 - 100.0 fL   MCH 25.8 (L) 26.0 - 34.0 pg   MCHC 30.3 30.0 - 36.0 g/dL   RDW 15.8 (H) 11.5 - 15.5 %   Platelets 264 150 - 400 K/uL   nRBC 0.0 0.0 - 0.2 %  Magnesium     Status: None   Collection Time: 06/23/20  5:39 AM  Result Value Ref Range   Magnesium 1.7 1.7 - 2.4 mg/dL  Phosphorus     Status: None   Collection Time: 06/23/20  5:39 AM  Result Value Ref Range   Phosphorus 4.3 2.5 - 4.6 mg/dL  Glucose, capillary     Status: Abnormal   Collection Time: 06/23/20  6:12 AM  Result Value Ref Range   Glucose-Capillary 266 (H) 70 - 99 mg/dL   Assessment & Plan:  Patient was evaluated and treated and all questions answered.  Left foot osteomyelitis, septic arthritis -Dressing intact to foot, ACE bandage removed from leg. -He does have some acute blood loss, likely from surgery. Transfuse as necessary. Order previously placed for T&S by Dr. Cruzita Lederer. -Discussed with patient that the foot does not appear salvageable given the extent of bone/joint and soft tissue infection and he would benefit from likely BKA. Patient states he understood and knows that's what needs to be done. -Recommend consult to Vascular or Orthopedics for eval for possible BKA left. -Will continue to follow.  Evelina Bucy, DPM  Accessible via secure chat for questions or concerns.

## 2020-06-23 NOTE — Consult Note (Signed)
Reason for Consult:Left foot osteo Referring Physician: C Eugene Diaz is an 54 y.o. male.  HPI: Eugene Diaz was admitted with ongoing issues related to a left foot infection. He has been followed by podiatry for this problem since fall of last year. In January he underwent TMT amputation. This last episode he had pain and discharge but oral abx were not helping. He underwent I&D and arthrotomy yesterday by podiatry who recommended BKA at this point and orthopedic surgery was consulted.  Past Medical History:  Diagnosis Date  . Alcohol abuse   . Alcoholic peripheral neuropathy ([place])   . Anxiety   . Arthritis   . Asthma    followed by pcp  . B12 deficiency   . Depression   . Diabetic neuropathy ([place])   . Edema of both lower extremities   . Essential tremor    neurologist-- dr patel--- due to alcohol abuse  . GERD (gastroesophageal reflux disease)   . History of cellulitis 2020   left lower leg;   recurrent 03-25-2020  . History of esophageal stricture    s/p  dilatation 02-13-2018  . History of osteomyelitis 2020   left great toe    . Hypercholesterolemia   . Hypertension    followed by pcp  (nuclear stress test 03-11-2014 low risk w/ no ischemia, ef 65%  . Normocytic anemia    followed by pcp   (03-18-2020 had transfusion's 02/ 2021)  . Peripheral vascular disease ([place])   . 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 Thrombocytopenia (HCC)    chronic  . Type 2 diabetes mellitus ([place])    followed by pcp---    (03-18-2020  pt stated does not check blood sugar)    Past Surgical History:  Procedure Laterality Date  . AMPUTATION Left 10/16/2019   Procedure: Left partial second ray resection; placement of  antibiotic beads;  Surgeon: Evelina Bucy, DPM;  [facility];  Service: Podiatry;  Laterality: Left;  . AMPUTATION Left 11/13/2019   Procedure: Left Midfoot Amputation - Transmetatarsal vs. Lisfranc; Placement antibiotic beads;  Surgeon: Evelina Bucy, DPM;  [facility];  Service: Podiatry;  Laterality: Left;  . AMPUTATION Left 12/21/2019   Procedure: Chopart Amputation left foot;  Surgeon: Evelina Bucy, DPM;  Location: [place];  Service: Podiatry;  Laterality: Left;  . AMPUTATION TOE Left 08/14/2019   Procedure: AMPUTATION GREAT TOE;  Surgeon: Evelina Bucy, DPM;  [facility];  Service: Podiatry;  Laterality: Left;  . APPLICATION OF WOUND VAC Left 12/21/2019   Procedure: Application Of Wound Vac;  Surgeon: Evelina Bucy, DPM;  Location: [place];  Service: Podiatry;  Laterality: Left;  . BIOPSY  02/13/2018   Procedure: BIOPSY;  Surgeon: Danie Binder, MD;  [facility];  Service: Endoscopy;;  transverse colon biopsy, gastric biopsy  . COLONOSCOPY WITH PROPOFOL N/A 02/13/2018   Procedure: COLONOSCOPY WITH PROPOFOL;  Surgeon: Danie Binder, MD;  [facility];  Service: Endoscopy;  Laterality: N/A;  11:15am  . ESOPHAGOGASTRODUODENOSCOPY (EGD) WITH PROPOFOL N/A 02/13/2018   Procedure: ESOPHAGOGASTRODUODENOSCOPY (EGD) WITH PROPOFOL;  Surgeon: Danie Binder, MD;  [facility];  Service: Endoscopy;  Laterality: N/A;  . I & D EXTREMITY Left 07/16/2019   Procedure: Insicion  AND DEBRIDEMENT EXTREMITY;  Surgeon: Evelina Bucy, DPM;  Location: [place];  Service: Podiatry;  Laterality: Left;  . INCISION AND DRAINAGE OF WOUND Left 10/13/2019   Procedure: IRRIGATION AND DEBRIDEMENT WOUND OF LEFT FOOT AND FIRST METATARSAL RESECTION;  Surgeon: Evelina Bucy, DPM;  [facility];  Service: Podiatry;  Laterality: Left;  . IRRIGATION AND DEBRIDEMENT FOOT Left 11/11/2019   Procedure: Left Foot Wound Irrigation and Debridement;  Surgeon: Evelina Bucy, DPM;  [facility];  Service: Podiatry;  Laterality: Left;  . Left arm     Left arm repair (tendon and artery)  . PERCUTANEOUS PINNING  07/16/2019   Procedure: Open Reduction Percutaneous Pinning Extremity;  Surgeon: Evelina Bucy, DPM;  Location: [place];  Service: Podiatry;;  . PILONIDAL CYST EXCISION N/A 08/08/2014   Procedure: CYST EXCISION PILONIDAL EXTENSIVE;  Surgeon: Jamesetta So, MD;  [facility];  Service: General;  Laterality: N/A;  . POLYPECTOMY  02/13/2018   Procedure: POLYPECTOMY;  Surgeon: Danie Binder, MD;  [facility];  Service: Endoscopy;;  transverse colon polyp hs, rectal polyps times 2  . SAVORY DILATION N/A 02/13/2018   Procedure: SAVORY DILATION;  Surgeon: Danie Binder, MD;  [facility];  Service: Endoscopy;  Laterality: N/A;  . WOUND DEBRIDEMENT Left 09/04/2019   Procedure: DEBRIDEMENT WOUND WITH COMPLEX  REPAIR OF DEHISCENCE;  Surgeon: Evelina Bucy, DPM;  Location: [place];  Service: Podiatry;  Laterality: Left;  Leave patient on stretcher  . WOUND DEBRIDEMENT Left 10/16/2019   Procedure: Left foot wound debridement and closure;  Surgeon: Evelina Bucy, DPM;  [facility];  Service: Podiatry;  Laterality: Left;  . WOUND DEBRIDEMENT Left 12/18/2019   Procedure: LEFT FOOT DEBRIDEMENT WITH PARTIAL INCISION OF INFECTED BONE;  Surgeon: Evelina Bucy, DPM;  Location: [place];  Service: Podiatry;  Laterality: Left;  LEFT FOOT DEBRIDEMENT WITH PARTIAL INCISION OF INFECTED BONE    Family History  Problem Relation Age of Onset  . Heart attack Mother        Living, 72  . Cancer Father        Deceased  . Healthy Brother   . Healthy Sister   . Colon cancer Neg Hx   . Colon polyps Neg Hx     Social History:  reports that he quit smoking about 35 years ago. His smoking use included cigarettes. He has a 62.50 pack-year smoking history. He has never used smokeless tobacco. He reports current alcohol use of  about 50.0 standard drinks of alcohol per week. He reports previous drug use. Drug: Marijuana.  Allergies: No Known Allergies  Medications: I have reviewed the patient's current medications.  Results for orders placed or performed during the hospital encounter of 06/21/20 (from the past 48 hour(s))  SARS Coronavirus 2 by RT PCR (hospital order, performed in [facility] hospital lab) Nasopharyngeal Nasopharyngeal Swab     Status: None   Collection Time: 06/21/20  2:09 PM   Specimen: Nasopharyngeal Swab  Result Value Ref Range   SARS Coronavirus 2 NEGATIVE NEGATIVE    Comment: (NOTE) SARS-CoV-2 target nucleic acids are NOT DETECTED.  The SARS-CoV-2 RNA is generally detectable in upper and lower respiratory specimens during the acute phase of infection. The lowest concentration of SARS-CoV-2 viral copies this assay can detect is 250 copies / mL.  A negative result does not preclude SARS-CoV-2 infection and should not be used as the sole basis for treatment or other patient management decisions.  A negative result may occur with improper specimen collection / handling, submission of specimen other than nasopharyngeal swab, presence of viral mutation(s) within the areas targeted by this assay, and inadequate number of viral copies (<250 copies / mL). A negative result must be combined with clinical observations, patient history, and epidemiological information.  Fact Sheet for Patients:   StrictlyIdeas.no  Fact Sheet for Healthcare Providers: BankingDealers.co.za  This test is not yet approved or  cleared by the [place] FDA and has been authorized for detection and/or diagnosis of SARS-CoV-2 by FDA under an Emergency Use Authorization (EUA).  This EUA will remain in effect (meaning this test can be used) for the duration of the COVID-19 declaration under Section 564(b)(1) of the Act, [place]. section 360bbb-3(b)(1), unless the  authorization is terminated or revoked sooner.  Performed at [facility], [address], [place], [place] [address]   Lactic acid, plasma     Status: Abnormal   Collection Time: 06/21/20  2:14 PM  Result Value Ref Range   Lactic Acid, Venous 2.2 (HH) 0.5 - 1.9 mmol/L    Comment: CRITICAL RESULT CALLED TO, READ BACK BY AND VERIFIED WITH: TRAVIS DEVOLT,RN @1451  06/21/2020 KAY Performed at [facility], [address], [place], [place] [address]   Comprehensive metabolic panel     Status: Abnormal   Collection Time: 06/21/20  2:14 PM  Result Value Ref Range   Sodium 133 (L) 135 - 145 mmol/L   Potassium 4.3 3.5 - 5.1 mmol/L   Chloride 102 98 - 111 mmol/L   CO2 22 22 - 32 mmol/L   Glucose, Bld 280 (H) 70 - 99 mg/dL    Comment: Glucose reference range applies only to samples taken after fasting for at least 8 hours.   BUN 16 6 - 20 mg/dL   Creatinine, Ser 1.24 0.61 - 1.24 mg/dL   Calcium 8.8 (L) 8.9 - 10.3 mg/dL   Total Protein 8.5 (H) 6.5 - 8.1 g/dL   Albumin 2.9 (L) 3.5 - 5.0 g/dL   AST 19 15 - 41 U/L   ALT 9 0 - 44 U/L   Alkaline Phosphatase 92 38 - 126 U/L   Total Bilirubin 0.5 0.3 - 1.2 mg/dL   GFR calc non Af Amer >60 >60 mL/min   GFR calc Af Amer >60 >60 mL/min   Anion gap 9 5 - 15    Comment: Performed at [facility], [address], [place], [place] [address]  CBC WITH DIFFERENTIAL     Status: Abnormal   Collection Time: 06/21/20  2:14 PM  Result Value Ref Range   WBC 8.8 4.0 - 10.5 K/uL   RBC 3.20 (L) 4.22 - 5.81 MIL/uL   Hemoglobin 8.5 (L) 13.0 - 17.0 g/dL   HCT 27.8 (L) 39 - 52 %   MCV 86.9 80.0 - 100.0 fL   MCH 26.6 26.0 - 34.0 pg   MCHC 30.6 30.0 - 36.0 g/dL   RDW 16.0 (H) 11.5 - 15.5 %   Platelets 374 150 - 400 K/uL   nRBC 0.0 0.0 - 0.2 %   Neutrophils Relative % 80 %   Neutro Abs 7.1 1.7 - 7.7 K/uL   Lymphocytes Relative 11 %   Lymphs Abs 1.0 0.7 - 4.0 K/uL   Monocytes Relative 6 %   Monocytes Absolute 0.6 0 -  1 K/uL   Eosinophils Relative 1 %    Eosinophils Absolute 0.1 0 - 0 K/uL   Basophils Relative 1 %   Basophils Absolute 0.0 0 - 0 K/uL   Immature Granulocytes 1 %   Abs Immature Granulocytes 0.07 0.00 - 0.07 K/uL    Comment: Performed at [facility], [address], [place], [place] [address]  Protime-INR     Status: None   Collection Time: 06/21/20  2:14 PM  Result Value Ref Range   Prothrombin Time 13.9 11.4 - 15.2 seconds   INR 1.1 0.8 - 1.2    Comment: (NOTE) INR goal varies based on device and disease states. Performed at [facility], [address], [place], [place] [address]   APTT     Status: Abnormal   Collection Time: 06/21/20  2:14 PM  Result Value Ref Range   aPTT 37 (H) 24 - 36 seconds    Comment:        IF BASELINE aPTT IS ELEVATED, SUGGEST PATIENT RISK ASSESSMENT BE USED TO DETERMINE APPROPRIATE ANTICOAGULANT THERAPY. Performed at [facility], [address] [place], [place] [address]   Magnesium     Status: None   Collection Time: 06/21/20  2:14 PM  Result Value Ref Range   Magnesium 1.9 1.7 - 2.4 mg/dL    Comment: Performed at [facility], [address], [place], [place] [address]  Phosphorus     Status: None   Collection Time: 06/21/20  2:14 PM  Result Value Ref Range   Phosphorus 2.7 2.5 - 4.6 mg/dL    Comment: Performed at [facility], [address], [place], [place] [address]  Sedimentation rate     Status: Abnormal   Collection Time: 06/21/20  2:14 PM  Result Value Ref Range   Sed Rate 120 (H) 0 - 16 mm/hr    Comment: Performed at [facility], [address], [place], [place] [address]  [place]-reactive protein     Status: Abnormal   Collection Time: 06/21/20  2:14 PM  Result Value Ref Range   CRP 20.9 (H) <1.0 mg/dL    Comment: Performed at [facility], [address] [place], [place] [address]  Hemoglobin A1c     Status: Abnormal   Collection Time: 06/21/20  2:14 PM  Result Value Ref Range   Hgb A1c MFr Bld 6.0 (H) 4.8 - 5.6 %    Comment: (NOTE)         Prediabetes: 5.7 - 6.4          Diabetes: >6.4         Glycemic control for adults with diabetes: <7.0    Mean Plasma Glucose 126 mg/dL    Comment: (NOTE) Performed At: [facility] [place], [place] [id number] Rush Farmer MD OV:[id number]   Blood Culture (routine x 2)     Status: None (Preliminary result)   Collection Time: 06/21/20  2:16 PM   Specimen: Vein; Blood  Result Value Ref Range   Specimen Description      RIGHT ANTECUBITAL BOTTLES DRAWN AEROBIC AND ANAEROBIC   Special Requests      Blood Culture adequate volume Performed at [facility], [address], [place], [place] [address]    Culture PENDING    Report Status PENDING   Blood Culture (routine x 2)     Status: None (Preliminary result)   Collection Time: 06/21/20  2:16 PM   Specimen: Vein; Blood  Result Value Ref Range   Specimen  Description      LEFT ANTECUBITAL BOTTLES DRAWN AEROBIC AND ANAEROBIC   Special Requests      Blood Culture adequate volume Performed at [facility], [address] [place], [place] [address]    Culture PENDING    Report Status PENDING   Lactic acid, plasma     Status: None   Collection Time: 06/21/20  3:59 PM  Result Value Ref Range   Lactic Acid, Venous 1.8 0.5 - 1.9 mmol/L    Comment: Performed at [facility], [address] [place], [place] [address]  Urinalysis, Routine w reflex microscopic Urine, Random     Status: Abnormal   Collection Time: 06/21/20  5:12 PM  Result Value Ref Range   Color, Urine YELLOW YELLOW   APPearance CLEAR CLEAR   Specific Gravity, Urine 1.014 1.005 - 1.030   pH 5.0 5.0 - 8.0   Glucose, UA 150 (A) NEGATIVE mg/dL   Hgb urine dipstick LARGE (A) NEGATIVE   Bilirubin Urine NEGATIVE NEGATIVE   Ketones, ur NEGATIVE NEGATIVE mg/dL   Protein, ur 100 (A) NEGATIVE mg/dL   Nitrite POSITIVE (A) NEGATIVE   Leukocytes,Ua NEGATIVE NEGATIVE   RBC / HPF 11-20 0 - 5 RBC/hpf   WBC, UA 6-10 0 - 5 WBC/hpf   Bacteria, UA RARE (A) NONE SEEN   Mucus PRESENT     Hyaline Casts, UA PRESENT     Comment: Performed at [facility], [address] [place], [place] [address]  Urine culture     Status: Abnormal   Collection Time: 06/21/20  5:12 PM   Specimen: Urine, Catheterized  Result Value Ref Range   Specimen Description      URINE, CATHETERIZED Performed at [facility], [address] [place], [place] [address]    Special Requests      NONE Performed at [facility], [address] [place] [address] [place], [place] [address]    Culture (A)     <10,000 COLONIES/mL INSIGNIFICANT GROWTH Performed at [facility] [address], [place], [place] [address]    Report Status 06/22/2020 FINAL   CBG monitoring, ED     Status: Abnormal   Collection Time: 06/21/20  6:51 PM  Result Value Ref Range   Glucose-Capillary 143 (H) 70 - 99 mg/dL    Comment: Glucose reference range applies only to samples taken after fasting for at least 8 hours.  Glucose, capillary     Status: Abnormal   Collection Time: 06/21/20 11:51 PM  Result Value Ref Range   Glucose-Capillary 201 (H) 70 - 99 mg/dL    Comment: Glucose reference range applies only to samples taken after fasting for at least 8 hours.  Surgical pcr screen     Status: Abnormal   Collection Time: 06/22/20  2:10 AM   Specimen: Nasal Mucosa; Nasal Swab  Result Value Ref Range   MRSA, PCR NEGATIVE NEGATIVE   Staphylococcus aureus POSITIVE (A) NEGATIVE    Comment: (NOTE) The Xpert SA Assay (FDA approved for NASAL specimens in patients 45 years of age and older), is one component of a comprehensive surveillance program. It is not intended to diagnose infection nor to guide or monitor treatment. Performed at [facility], [place] [address] [place], [place] [address]   Glucose, capillary     Status: Abnormal   Collection Time: 06/22/20  5:33 AM  Result Value Ref Range   Glucose-Capillary 142 (H) 70 - 99 mg/dL    Comment: Glucose reference range applies only to  samples taken after fasting for at least 8 hours.   Glucose, capillary     Status: Abnormal   Collection Time: 06/22/20 11:41 AM  Result Value Ref Range   Glucose-Capillary 138 (H) 70 - 99 mg/dL    Comment: Glucose reference range applies only to samples taken after fasting for at least 8 hours.  Glucose, capillary     Status: Abnormal   Collection Time: 06/22/20  6:10 PM  Result Value Ref Range   Glucose-Capillary 125 (H) 70 - 99 mg/dL    Comment: Glucose reference range applies only to samples taken after fasting for at least 8 hours.  Aerobic/Anaerobic Culture (surgical/deep wound)     Status: None (Preliminary result)   Collection Time: 06/22/20  7:44 PM   Specimen: Foot, Left; Wound  Result Value Ref Range   Specimen Description WOUND LEFT FOOT    Special Requests NONE    Gram Stain      RARE WBC PRESENT, PREDOMINANTLY PMN RARE GRAM POSITIVE COCCI Performed at [facility], [place] [address] [place] [address], [place], [place] [address]    Culture PENDING    Report Status PENDING   Aerobic/Anaerobic Culture (surgical/deep wound)     Status: None (Preliminary result)   Collection Time: 06/22/20  8:04 PM   Specimen: Bone; Tissue  Result Value Ref Range   Specimen Description BONE    Special Requests TALUS    Gram Stain      NO WBC SEEN NO ORGANISMS SEEN Performed at [facility], [place] [address], [place], [place] [address]    Culture PENDING    Report Status PENDING   Glucose, capillary     Status: Abnormal   Collection Time: 06/22/20  8:31 PM  Result Value Ref Range   Glucose-Capillary 124 (H) 70 - 99 mg/dL    Comment: Glucose reference range applies only to samples taken after fasting for at least 8 hours.  Glucose, capillary     Status: Abnormal   Collection Time: 06/22/20 11:49 PM  Result Value Ref Range   Glucose-Capillary 267 (H) 70 - 99 mg/dL    Comment: Glucose reference range applies only to samples taken after fasting for at least 8 hours.  Comprehensive metabolic panel     Status: Abnormal   Collection Time:  06/23/20  5:39 AM  Result Value Ref Range   Sodium 134 (L) 135 - 145 mmol/L   Potassium 5.2 (H) 3.5 - 5.1 mmol/L   Chloride 103 98 - 111 mmol/L   CO2 24 22 - 32 mmol/L   Glucose, Bld 285 (H) 70 - 99 mg/dL    Comment: Glucose reference range applies only to samples taken after fasting for at least 8 hours.   BUN 11 6 - 20 mg/dL   Creatinine, Ser 1.07 0.61 - 1.24 mg/dL   Calcium 8.6 (L) 8.9 - 10.3 mg/dL   Total Protein 6.6 6.5 - 8.1 g/dL   Albumin 1.9 (L) 3.5 - 5.0 g/dL   AST 11 (L) 15 - 41 U/L   ALT 9 0 - 44 U/L   Alkaline Phosphatase 65 38 - 126 U/L   Total Bilirubin 0.2 (L) 0.3 - 1.2 mg/dL   GFR calc non Af Amer >60 >60 mL/min   GFR calc Af Amer >60 >60 mL/min   Anion gap 7 5 - 15    Comment: Performed at [facility] [address] [place], [place] [address]  CBC     Status: Abnormal  Collection Time: 06/23/20  5:39 AM  Result Value Ref Range   WBC 6.9 4.0 - 10.5 K/uL   RBC 2.56 (L) 4.22 - 5.81 MIL/uL   Hemoglobin 6.6 (LL) 13.0 - 17.0 g/dL    Comment: REPEATED TO VERIFY THIS CRITICAL RESULT HAS VERIFIED AND BEEN CALLED TO J. ATANBILE, RN BY JULIE MACEDA DEL ANGEL ON 08 24 2021 AT [phone], AND HAS BEEN READ BACK.     HCT 21.8 (L) 39 - 52 %   MCV 85.2 80.0 - 100.0 fL   MCH 25.8 (L) 26.0 - 34.0 pg   MCHC 30.3 30.0 - 36.0 g/dL   RDW 15.8 (H) 11.5 - 15.5 %   Platelets 264 150 - 400 K/uL   nRBC 0.0 0.0 - 0.2 %    Comment: Performed at [facility] [address] [place], [place] [address]  Magnesium     Status: None   Collection Time: 06/23/20  5:39 AM  Result Value Ref Range   Magnesium 1.7 1.7 - 2.4 mg/dL    Comment: Performed at [facility] [address] [place], [place] [address]  Phosphorus     Status: None   Collection Time: 06/23/20  5:39 AM  Result Value Ref Range   Phosphorus 4.3 2.5 - 4.6 mg/dL    Comment: Performed at [facility] [address], [place], [place] [address]  Glucose, capillary     Status: Abnormal   Collection  Time: 06/23/20  6:12 AM  Result Value Ref Range   Glucose-Capillary 266 (H) 70 - 99 mg/dL    Comment: Glucose reference range applies only to samples taken after fasting for at least 8 hours.  Type and screen [place]     Status: None (Preliminary result)   Collection Time: 06/23/20  8:32 AM  Result Value Ref Range   ABO/RH(D) PENDING    Antibody Screen PENDING    Sample Expiration      06/26/2020,2359 Performed at [facility], [place] [address], [place], [place] [address]     DG Chest 2 View  Result Date: 06/21/2020 CLINICAL DATA:  Question sepsis.  Evaluate for abnormality. EXAM: CHEST - 2 VIEW COMPARISON:  December 29, 2019 FINDINGS: No pneumothorax. The heart, hila, and mediastinum are normal. No pulmonary nodules or masses. No focal infiltrates. IMPRESSION: No active cardiopulmonary disease. Electronically Signed   By: Dorise Bullion III M.D   On: 06/21/2020 14:31   DG Tibia/Fibula Left  Result Date: 06/21/2020 CLINICAL DATA:  Leg pain. Dressing on left leg with redness and swelling. EXAM: LEFT TIBIA AND FIBULA - 2 VIEW COMPARISON:  None. FINDINGS: Edema throughout the soft tissues. No soft tissue gas identified. There is periostitis associated with the distal tibia and fibula as well as osteopenia associated with both bones. There is also osteopenia in the posterior calcaneus, incompletely evaluated. Erosive changes seen along the posterior tibia on the lateral view. No other bony abnormalities. IMPRESSION: 1. Soft tissue edema throughout the lower leg without soft tissue gas. 2. Osteopenia in portions of the tibia, fibula, and posterior calcaneus. Apparent erosive change along the posterior tibia on the lateral view. Given the left foot partial amputation, these findings could be due to aggressive osteoporosis from lack of mobility. Osteomyelitis not excluded on this study. If there is clinical concern for osteomyelitis in the region of the ankle and hindfoot, MRI  would be more sensitive and specific. Electronically Signed   By: Shanon Brow  Mee Hives M.D   On: 06/21/2020 14:34   CT HEAD WO CONTRAST  Result Date: 06/21/2020 CLINICAL DATA:  Multiple falls with trauma to the head. EXAM: CT HEAD WITHOUT CONTRAST TECHNIQUE: Contiguous axial images were obtained from the base of the skull through the vertex without intravenous contrast. COMPARISON:  04/09/2020 FINDINGS: Brain: Mild age related volume loss. No evidence of old or acute focal infarction, mass lesion, hemorrhage, hydrocephalus or extra-axial collection. Vascular: There is atherosclerotic calcification of the major vessels at the base of the brain. Skull: Negative Sinuses/Orbits: Clear/normal Other: None IMPRESSION: No acute CT finding.  Mild age related volume loss. Electronically Signed   By: Nelson Chimes M.D.   On: 06/21/2020 17:01   MR TIBIA FIBULA LEFT W WO CONTRAST  Result Date: 06/22/2020 CLINICAL DATA:  Left leg pain, swelling, redness. EXAM: MRI OF LOWER LEFT EXTREMITY WITHOUT AND WITH CONTRAST TECHNIQUE: Multiplanar, multisequence MR imaging of the left tibia and fibula was performed both before and after administration of intravenous contrast. CONTRAST:  69mL GADAVIST GADOBUTROL 1 MMOL/ML IV SOLN COMPARISON:  X-ray 06/21/2020 FINDINGS: Technical note: Motion degraded examination. Extensive bone marrow edema centered at the left ankle and hindfoot with confluent low T1 marrow signal involving the calcaneus, talus, navicular, as well as the distal portions of the left tibia and fibula (series 7, images 16-25; series 14, images 71-81). Extensive bony destruction within the hindfoot. The midfoot is not well seen within the field of view, but is also likely involved. Soft tissue ulceration at the anteromedial aspect of the ankle with rim enhancing fluid collection extending to the tibiotalar and subtalar joints compatible with abscess and septic arthritis (series 34, images 19-25). Abscess measures up to 6.3  cm in maximal dimension. Additional small 1.0 cm abscess within Kager's fat (series 31, image 68). Diffuse soft tissue edema throughout the lower extremity. There is diffuse edema-like signal throughout the lower extremity musculature which may reflect myositis and/or denervation changes. Small amount of fluid tracks along the fascial margins of the lower extremity musculature within the calf. No rim enhancing fascial fluid collection. No intramuscular collection. IMPRESSION: 1. Extensive acute osteomyelitis centered at the left ankle and hindfoot, as described above. 2. Soft tissue ulceration at the anteromedial aspect of the ankle with a 6.3 cm deep soft tissue abscess and associated septic arthritis of the tibiotalar and subtalar joints. 3. Diffuse edema-like signal throughout the lower extremity musculature which may reflect myositis and/or denervation changes. Electronically Signed   By: Davina Poke D.O.   On: 06/22/2020 08:10    Review of Systems  Constitutional: Negative for chills, diaphoresis and fever.  HENT: Negative for ear discharge, ear pain, hearing loss and tinnitus.   Eyes: Negative for photophobia and pain.  Respiratory: Negative for cough and shortness of breath.   Cardiovascular: Negative for chest pain.  Gastrointestinal: Negative for abdominal pain, nausea and vomiting.  Genitourinary: Negative for dysuria, flank pain, frequency and urgency.  Musculoskeletal: Positive for arthralgias (Left ankle). Negative for back pain, myalgias and neck pain.  Neurological: Negative for dizziness and headaches.  Hematological: Does not bruise/bleed easily.  Psychiatric/Behavioral: The patient is not nervous/anxious.    Blood pressure 108/72, pulse 80, temperature 98.1 F (36.7 [place]), temperature source Oral, resp. rate 18, height 6' 2.02" (1.88 m), weight 132 kg, SpO2 96 %. Physical Exam Constitutional:      General: He is not in acute distress.    Appearance: He is well-developed. He  is not diaphoretic.  HENT:  Head: Normocephalic and atraumatic.  Eyes:     General: No scleral icterus.       Right eye: No discharge.        Left eye: No discharge.     Conjunctiva/sclera: Conjunctivae normal.  Cardiovascular:     Rate and Rhythm: Normal rate and regular rhythm.  Pulmonary:     Effort: Pulmonary effort is normal. No respiratory distress.  Musculoskeletal:     Cervical back: Normal range of motion.     Comments: LLE S/p TMT amputation  Large plantar wound with purulent discharge and odor  No knee effusion  Knee stable to varus/ valgus and anterior/posterior stress  Sens DPN, SPN, TN paresthetic  Motor ext, flex, evers 3/5  DP 1+, PT 1+, very faint, 2+ NP edema  Skin:    General: Skin is warm and dry.  Neurological:     Mental Status: He is alert.  Psychiatric:        Behavior: Behavior normal.     Assessment/Plan: Left foot osteo -- Agree with BKA recommendation. Dr. Sharol Given to evaluate later today. Will check [name]'s given greatly diminished pulses. NPO after MN. Multiple medical problems including diabetes mellitus, hypertension, and alcoholism -- per primary service    Lisette Abu, PA-[facility] [phone] 06/23/2020, 8:53 AM

## 2020-06-23 NOTE — H&P (View-Only) (Signed)
ORTHOPAEDIC CONSULTATION  REQUESTING PHYSICIAN: Caren Griffins, MD  Chief Complaint: Osteomyelitis abscess left ankle and hindfoot.  HPI: Eugene Diaz is a 54 y.o. male who presents with osteomyelitis abscess purulent drainage involving the ankle and hindfoot on the left.  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  Patient had an MRI scan yesterday morning that showed osteomyelitis and abscess involving the tibiotalar joint and the subtalar joint and patient underwent irrigation and debridement last night of the left show part amputation.  Past Medical History:  Diagnosis Date  . Alcohol abuse   . Alcoholic peripheral neuropathy ([place])   . Anxiety   . Arthritis   . Asthma    followed by pcp  . B12 deficiency   . Depression   . Diabetic neuropathy ([place])   . Edema of both lower extremities   . Essential tremor    neurologist-- dr patel--- due to alcohol abuse  . GERD (gastroesophageal reflux disease)   . History of cellulitis 2020   left lower leg;   recurrent 03-25-2020  . History of esophageal stricture    s/p  dilatation 02-13-2018  . History of osteomyelitis 2020   left great toe    . Hypercholesterolemia   . Hypertension    followed by pcp  (nuclear stress test 03-11-2014 low risk w/ no ischemia, ef 65%  . Normocytic anemia    followed by pcp   (03-18-2020 had transfusion's 02/ 2021)  . Peripheral vascular disease ( [place])   . 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 Thrombocytopenia ([facility])    chronic  . Type 2 diabetes mellitus ([place])    followed by pcp---    (03-18-2020  pt  stated does not check blood sugar)   Past Surgical History:  Procedure Laterality Date  . AMPUTATION Left 10/16/2019   Procedure: Left partial second ray resection; placement of antibiotic beads;  Surgeon: Evelina Bucy, DPM;  [facility]: [facility];  Service: Podiatry;  Laterality: Left;  . AMPUTATION Left 11/13/2019   Procedure: Left Midfoot Amputation - Transmetatarsal vs. Lisfranc; Placement antibiotic beads;  Surgeon: Evelina Bucy, DPM;  Location: [facility];  Service: Podiatry;  Laterality: Left;  . AMPUTATION Left 12/21/2019   Procedure: Chopart Amputation left foot;  Surgeon: Evelina Bucy, DPM;  Location: [place];  Service: Podiatry;  Laterality: Left;  . AMPUTATION TOE Left 08/14/2019   Procedure: AMPUTATION GREAT TOE;  Surgeon: Evelina Bucy, DPM;  [facility]: [facility];  Service: Podiatry;  Laterality: Left;  . APPLICATION OF WOUND VAC Left 12/21/2019   Procedure: Application Of Wound Vac;  Surgeon: Evelina Bucy, DPM;  Location: [place];  Service: Podiatry;  Laterality: Left;  . BIOPSY  02/13/2018   Procedure: BIOPSY;  Surgeon: Danie Binder, MD;  [facility];  Service: Endoscopy;;  transverse colon biopsy, gastric biopsy  . COLONOSCOPY WITH PROPOFOL N/A 02/13/2018   Procedure: COLONOSCOPY WITH PROPOFOL;  Surgeon: Danie Binder, MD;  [facility];  Service: Endoscopy;  Laterality: N/A;  11:15am  . ESOPHAGOGASTRODUODENOSCOPY (EGD) WITH PROPOFOL N/A 02/13/2018  Procedure: ESOPHAGOGASTRODUODENOSCOPY (EGD) WITH PROPOFOL;  Surgeon: Danie Binder, MD;  [facility];  Service: Endoscopy;  Laterality: N/A;  . I & D EXTREMITY Left 07/16/2019   Procedure: Insicion  AND DEBRIDEMENT EXTREMITY;  Surgeon: Evelina Bucy, DPM;  Location: [place];  Service: Podiatry;  Laterality: Left;  . I & D EXTREMITY Left 06/22/2020   Procedure: IRRIGATION AND DEBRIDEMENT EXTREMITY, left foot and ankle;  Surgeon: Evelina Bucy, DPM;  Location: [place];  Service: Podiatry;   Laterality: Left;  . INCISION AND DRAINAGE OF WOUND Left 10/13/2019   Procedure: IRRIGATION AND DEBRIDEMENT WOUND OF LEFT FOOT AND FIRST METATARSAL RESECTION;  Surgeon: Evelina Bucy, DPM;  [facility];  Service: Podiatry;  Laterality: Left;  . IRRIGATION AND DEBRIDEMENT FOOT Left 11/11/2019   Procedure: Left Foot Wound Irrigation and Debridement;  Surgeon: Evelina Bucy, DPM;  [facility];  Service: Podiatry;  Laterality: Left;  . Left arm     Left arm repair (tendon and artery)  . PERCUTANEOUS PINNING  07/16/2019   Procedure: Open Reduction Percutaneous Pinning Extremity;  Surgeon: Evelina Bucy, DPM;  Location: [place];  Service: Podiatry;;  . PILONIDAL CYST EXCISION N/A 08/08/2014   Procedure: CYST EXCISION PILONIDAL EXTENSIVE;  Surgeon: Jamesetta So, MD;  [facility];  Service: General;  Laterality: N/A;  . POLYPECTOMY  02/13/2018   Procedure: POLYPECTOMY;  Surgeon: Danie Binder, MD;  [facility];  Service: Endoscopy;;  transverse colon polyp hs, rectal polyps times 2  . SAVORY DILATION N/A 02/13/2018   Procedure: SAVORY DILATION;  Surgeon: Danie Binder, MD;  [facility];  Service: Endoscopy;  Laterality: N/A;  . WOUND DEBRIDEMENT Left 09/04/2019   Procedure: DEBRIDEMENT WOUND WITH COMPLEX  REPAIR OF DEHISCENCE;  Surgeon: Evelina Bucy, DPM;  Location: [place];  Service: Podiatry;  Laterality: Left;  Leave patient on stretcher  . WOUND DEBRIDEMENT Left 10/16/2019   Procedure: Left foot wound debridement and closure;  Surgeon: Evelina Bucy, DPM;  [facility];  Service: Podiatry;  Laterality: Left;  . WOUND DEBRIDEMENT Left 12/18/2019   Procedure: LEFT FOOT DEBRIDEMENT WITH PARTIAL INCISION OF INFECTED BONE;  Surgeon: Evelina Bucy, DPM;  Location: [place];  Service: Podiatry;  Laterality: Left;  LEFT FOOT DEBRIDEMENT WITH PARTIAL INCISION OF INFECTED BONE   Social History   Socioeconomic History  . Marital  status: Widowed    Spouse name: Not on file  . Number of children: 0  . Years of education: Not on file  . Highest education level: 8th grade  Occupational History  . 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  Tobacco Use  . Smoking status: Former Smoker    Packs/day: 2.50    Years: 25.00    Pack years: 62.50    Types: Cigarettes    Quit date: 08/05/1984    Years since quitting: 35.9  . Smokeless tobacco: Never Used  Vaping Use  . Vaping Use: Never used  Substance and Sexual Activity  . Alcohol use: Yes    Alcohol/week: 50.0 standard drinks    Types: 50 Standard drinks or equivalent per week    Comment: Drinks 6 beers, liquor 4-6oz (both daily) x 35 years  . Drug use: Not Currently    Types: Marijuana    Comment: 03-18-2020  per pt none since 11/ 2020  . Sexual activity: Yes    Birth control/protection:  None  Other Topics Concern  . 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 Highest level of education:  8th grade   He lives with wife.  No children.   Left handed   Drinks soda, water-no coffee, no tea   Social Determinants of Health   Financial Resource Strain:   . Difficulty of Paying Living Expenses: Not on file  Food Insecurity:   . Worried About Charity fundraiser in the Last Year: Not on file  . Ran Out of Food in the Last Year: Not on file  Transportation Needs:   . Lack of Transportation (Medical): Not on file  . Lack of Transportation (Non-Medical): Not on file  Physical Activity:   . Days of Exercise per Week: Not on file  . Minutes of Exercise per Session: Not on file  Stress:   . Feeling of Stress : Not on file  Social Connections:   . Frequency of Communication with Friends and Family: Not on file  . Frequency of Social Gatherings with Friends and Family: Not on file  . Attends Religious Services: Not on file  . Active Member of Clubs or  Organizations: Not on file  . Attends Archivist Meetings: Not on file  . Marital Status: Not on file   Family History  Problem Relation Age of Onset  . Heart attack Mother        Living, 34  . Cancer Father        Deceased  . Healthy Brother   . Healthy Sister   . Colon cancer Neg Hx   . Colon polyps Neg Hx    - negative except otherwise stated in the family history section No Known Allergies Prior to Admission medications   Medication Sig Start Date End Date Taking? Authorizing Provider  albuterol (PROVENTIL HFA;VENTOLIN HFA) 108 (90 Base) MCG/ACT inhaler Inhale 2 puffs into the lungs every 6 (six) hours as needed for wheezing or shortness of breath. 10/31/18  Yes Hayden Rasmussen, MD  ALPRAZolam Duanne Moron) 1 MG tablet Take 0.5 tablets (0.5 mg total) by mouth 4 (four) times daily as needed for anxiety. 01/21/20  Yes Gherghe, Vella Redhead, MD  amitriptyline (ELAVIL) 100 MG tablet Take 100 mg by mouth at bedtime.   Yes [provider]  atorvastatin (LIPITOR) 40 MG tablet Take 40 mg by mouth every evening.  12/17/15  Yes [provider]  diclofenac Sodium (VOLTAREN) 1 % GEL Apply 1 application topically daily as needed (pain).   Yes [provider]  docusate sodium (COLACE) 100 MG capsule Take 100 mg by mouth daily as needed for mild constipation.   Yes [provider]  DULoxetine (CYMBALTA) 60 MG capsule Take 60 mg by mouth daily. 11/18/19  Yes [provider]  ferrous sulfate 325 (65 FE) MG tablet Take 1 tablet (325 mg total) by mouth daily. Patient taking differently: Take 325 mg by mouth daily.  11/15/19 11/14/20 Yes Shelly Coss, MD  furosemide (LASIX) 20 MG tablet Take 20 mg by mouth daily as needed. 03/18/20  Yes [provider]  gabapentin (NEURONTIN) 300 MG capsule Take 900mg  in the morning and at noon, then take 1200mg  at bedtime Patient taking differently: Take 900-1,200 mg by mouth See admin instructions. Take 3 capsules  (900 mg) by mouth every morning and at noon, take 4 capsules (1200 mg) at bedtime 06/14/19  Yes Narda Amber K, DO  glimepiride (AMARYL) 4 MG tablet Take 4 mg by mouth daily.  08/10/19  Yes [provider]  glycopyrrolate (ROBINUL) 1 MG tablet Take 1 mg by mouth 2 (two) times daily.  12/17/15  Yes [provider]  hydrOXYzine (ATARAX/VISTARIL) 25 MG tablet Take 25 mg by mouth 4 (four) times daily as needed. 05/18/20  Yes [provider]  ibuprofen (ADVIL) 400 MG tablet Take 1 tablet (400 mg total) by mouth every 8 (eight) hours as needed for moderate pain. 12/27/19  Yes Arrien, [name], MD  lisinopril (PRINIVIL,ZESTRIL) 20 MG tablet Take 20 mg by mouth daily.  12/30/15  Yes [provider]  morphine (MS CONTIN) 30 MG 12 hr tablet Take 30 mg by mouth every 12 (twelve) hours.  05/21/20  Yes [provider]  omeprazole (PRILOSEC) 20 MG capsule TAKE 1 CAPSULE WITH BREAKFAST AND SUPPER. Patient taking differently: Take 20 mg by mouth 2 (two) times daily before a meal.  11/19/19  Yes Mahala Menghini, PA-C  oxycodone (ROXICODONE) 30 MG immediate release tablet Take 30 mg by mouth 4 (four) times daily as needed. 05/08/20  Yes [provider]  primidone (MYSOLINE) 50 MG tablet Take 1 tablet (50 mg total) by mouth 2 (two) times daily. 06/14/19  Yes Patel, Donika K, DO  acetaminophen (TYLENOL) 325 MG tablet Take 2 tablets (650 mg total) by mouth every 6 (six) hours as needed for mild pain (or Fever >/= 101). Patient not taking: Reported on 06/21/2020 12/27/19   Arrien, [name], MD  clindamycin (CLEOCIN) 300 MG capsule Take 1 capsule (300 mg total) by mouth 2 (two) times daily. 04/22/20   Evelina Bucy, DPM   DG Chest 2 View  Result Date: 06/21/2020 CLINICAL DATA:  Question sepsis.  Evaluate for abnormality. EXAM: CHEST - 2 VIEW COMPARISON:  December 29, 2019 FINDINGS: No pneumothorax. The heart, hila, and mediastinum are normal. No pulmonary nodules  or masses. No focal infiltrates. IMPRESSION: No active cardiopulmonary disease. Electronically Signed   By: Dorise Bullion III M.D   On: 06/21/2020 14:31   DG Tibia/Fibula Left  Result Date: 06/21/2020 CLINICAL DATA:  Leg pain. Dressing on left leg with redness and swelling. EXAM: LEFT TIBIA AND FIBULA - 2 VIEW COMPARISON:  None. FINDINGS: Edema throughout the soft tissues. No soft tissue gas identified. There is periostitis associated with the distal tibia and fibula as well as osteopenia associated with both bones. There is also osteopenia in the posterior calcaneus, incompletely evaluated. Erosive changes seen along the posterior tibia on the lateral view. No other bony abnormalities. IMPRESSION: 1. Soft tissue edema throughout the lower leg without soft tissue gas. 2. Osteopenia in portions of the tibia, fibula, and posterior calcaneus. Apparent erosive change along the posterior tibia on the lateral view. Given the left foot partial amputation, these findings could be due to aggressive osteoporosis from lack of mobility. Osteomyelitis not excluded on this study. If there is clinical concern for osteomyelitis in the region of the ankle and hindfoot, MRI would be more sensitive and specific. Electronically Signed   By: Dorise Bullion III M.D   On: 06/21/2020 14:34   CT HEAD WO CONTRAST  Result Date: 06/21/2020 CLINICAL DATA:  Multiple falls with trauma to the head. EXAM: CT HEAD WITHOUT CONTRAST TECHNIQUE: Contiguous axial images were obtained from the base of the skull through the vertex without intravenous contrast. COMPARISON:  04/09/2020 FINDINGS: Brain: Mild age related volume loss. No evidence of old or acute focal infarction,  mass lesion, hemorrhage, hydrocephalus or extra-axial collection. Vascular: There is atherosclerotic calcification of the major vessels at the base of the brain. Skull: Negative Sinuses/Orbits: Clear/normal Other: None IMPRESSION: No acute CT finding.  Mild age related  volume loss. Electronically Signed   By: Nelson Chimes M.D.   On: 06/21/2020 17:01   MR TIBIA FIBULA LEFT W WO CONTRAST  Result Date: 06/22/2020 CLINICAL DATA:  Left leg pain, swelling, redness. EXAM: MRI OF LOWER LEFT EXTREMITY WITHOUT AND WITH CONTRAST TECHNIQUE: Multiplanar, multisequence MR imaging of the left tibia and fibula was performed both before and after administration of intravenous contrast. CONTRAST:  38mL GADAVIST GADOBUTROL 1 MMOL/ML IV SOLN COMPARISON:  X-ray 06/21/2020 FINDINGS: Technical note: Motion degraded examination. Extensive bone marrow edema centered at the left ankle and hindfoot with confluent low T1 marrow signal involving the calcaneus, talus, navicular, as well as the distal portions of the left tibia and fibula (series 7, images 16-25; series 14, images 71-81). Extensive bony destruction within the hindfoot. The midfoot is not well seen within the field of view, but is also likely involved. Soft tissue ulceration at the anteromedial aspect of the ankle with rim enhancing fluid collection extending to the tibiotalar and subtalar joints compatible with abscess and septic arthritis (series 34, images 19-25). Abscess measures up to 6.3 cm in maximal dimension. Additional small 1.0 cm abscess within Kager's fat (series 31, image 68). Diffuse soft tissue edema throughout the lower extremity. There is diffuse edema-like signal throughout the lower extremity musculature which may reflect myositis and/or denervation changes. Small amount of fluid tracks along the fascial margins of the lower extremity musculature within the calf. No rim enhancing fascial fluid collection. No intramuscular collection. IMPRESSION: 1. Extensive acute osteomyelitis centered at the left ankle and hindfoot, as described above. 2. Soft tissue ulceration at the anteromedial aspect of the ankle with a 6.3 cm deep soft tissue abscess and associated septic arthritis of the tibiotalar and subtalar joints. 3. Diffuse  edema-like signal throughout the lower extremity musculature which may reflect myositis and/or denervation changes. Electronically Signed   By: Davina Poke D.O.   On: 06/22/2020 08:10   - pertinent xrays, CT, MRI studies were reviewed and independently interpreted  Positive ROS: All other systems have been reviewed and were otherwise negative with the exception of those mentioned in the HPI and as above.  Physical Exam: General: Alert, no acute distress Psychiatric: Patient is competent for consent with normal mood and affect Lymphatic: No axillary or cervical lymphadenopathy Cardiovascular: No pedal edema Respiratory: No cyanosis, no use of accessory musculature GI: No organomegaly, abdomen is soft and non-tender    Images:  @ENCIMAGES @  Labs:  Lab Results  Component Value Date   HGBA1C 6.0 (H) 06/21/2020   HGBA1C 4.7 (L) 01/17/2020   HGBA1C 6.4 (H) 11/10/2019   ESRSEDRATE 120 (H) 06/21/2020   ESRSEDRATE 40 (H) 01/18/2020   ESRSEDRATE 97 (H) 11/10/2019   CRP 20.9 (H) 06/21/2020   CRP 14.1 (H) 01/19/2020   CRP 7.6 (H) 11/10/2019   REPTSTATUS PENDING 06/22/2020   GRAMSTAIN  06/22/2020    NO WBC SEEN NO ORGANISMS SEEN Performed at [facility], [place] [address], [place], [place] [address]    CULT PENDING 06/22/2020   LABORGA STAPHYLOCOCCUS AUREUS 12/18/2019   LABORGA ENTEROBACTER CLOACAE 12/18/2019   LABORGA ENTEROCOCCUS FAECALIS 12/18/2019    Lab Results  Component Value Date   ALBUMIN 1.9 (L) 06/23/2020   ALBUMIN 2.9 (L) 06/21/2020   ALBUMIN  3.0 (L) 04/09/2020   PREALBUMIN 14.0 (L) 10/11/2019    Neurologic: Patient does not have protective sensation bilateral lower extremities.   MUSCULOSKELETAL:   Skin: Examination patient has dermatitis with venous and lymphatic insufficiency of the left lower extremity with cellulitis around the ankle.  The surgical dressing is in place.  Most recent ankle-brachial indices 6 months ago showed biphasic flow at  the ankle and no significant peripheral vascular disease.  Patient has good hair growth in the calf and leg.  Patient's albumin is 1.9 with a hemoglobin A1c of 6.  Review of the MRI scan shows extensive osteomyelitis and abscess involving the distal tibia and talus with large involved abscess.  Assessment: Assessment: Diabetic insensate neuropathy with severe protein caloric malnutrition with abscess and ulceration left ankle and hindfoot status post foot salvage intervention with a Chopart amputation and status post debridement last night.  Plan: Patient's only option for limb salvage would be to proceed with a transtibial amputation.  Risks and benefits were discussed patient states he understands wished to proceed with surgery.  We will plan for a left transtibial amputation tomorrow Wednesday.  Patient has worked with Museum/gallery curator in the past and we will have [facility] provide a limb protector and stump shrinker.  Thank you for the consult and the opportunity to see Mr. Kalix Meinecke, [place] [phone] 9:54 AM

## 2020-06-23 NOTE — Progress Notes (Signed)
PROGRESS NOTE  Eugene Diaz JSE:[id number] DOB: 03-Jun-1966 DOA: 06/21/2020 PCP: [name], PA-C   LOS: 2 days   Brief Narrative / Interim history: Eugene Diaz is a 54 y.o. male with medical history significant for left foot osteomyelitis and amputation, diabetes mellitus, hypertension, alcoholism.Patient presented to the ED with complaints of increasing left leg redness, swelling, pain and yellowish drainage ongoing for several days to weeks.  Reports he has been treated with courses of antibiotics prescribed by his podiatrist.  But symptoms have persisted.  Subjective / 24h Interval events: Eating breakfast this morning denies any severe pain in his foot.  Assessment & Plan: Principal Problem Sepsis due to cellulitis/osteomyelitis -patient met sepsis criteria on admission with fever, tachycardia, source.  Lactic acid was elevated 2.2.  He was started on broad-spectrum antibiotics with Vanco, metronidazole, cefepime. He underwent a MRI of the left fibula, which shows acute osteomyelitis in the left ankle and hindfoot, as well as abscess in the soft tissue and septic arthritis.  Podiatry consulted, patient went to the OR on 8/23 for I&D.  Given extent of the infection podiatry recommended surgical consult for BKA.  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  Continue gabapentin  CBG (last 3)  Recent Labs    06/22/20 2031 06/22/20 2349 06/23/20 0612  GLUCAP 124* 267* 266*     Scheduled Meds: . amitriptyline  100 mg Oral QHS  . atorvastatin  40 mg Oral  QPM  . Chlorhexidine Gluconate Cloth  6 each Topical Q0600  . DULoxetine  60 mg Oral Daily  . folic acid  1 mg Oral Daily  . gabapentin  900 mg Oral Daily   And  . gabapentin  1,200 mg Oral QHS  . glycopyrrolate  1 mg Oral BID  . insulin aspart  0-15 Units Subcutaneous Q6H  . lisinopril  20 mg Oral QHS  . LORazepam  0-4 mg Intravenous Q6H   Followed by  . LORazepam  0-4 mg Intravenous Q12H  . morphine  30 mg Oral Q12H  . multivitamin with minerals  1 tablet Oral Daily  . mupirocin ointment  1 application Nasal BID  . pantoprazole  40 mg Oral Daily  . primidone  50 mg Oral BID  . thiamine  100 mg Oral Daily   Or  . thiamine  100 mg Intravenous Daily   Continuous Infusions: . ceFEPime (MAXIPIME) IV 2 g (06/23/20 0548)  . metronidazole 500 mg (06/23/20 0548)  . vancomycin 1,000 mg (06/23/20 0320)   PRN Meds:.acetaminophen **OR** acetaminophen, ALPRAZolam, LORazepam **OR** LORazepam, ondansetron **OR** ondansetron (ZOFRAN) IV, oxycodone, polyethylene glycol  Diet Orders (From admission, onward)    Start     Ordered   06/22/20 2156  Diet Carb Modified Fluid consistency: Thin; Room service appropriate? Yes  Diet effective now       Question Answer Comment  Diet-HS Snack? Nothing   Calorie Level Medium 1600-2000   Fluid consistency: Thin   Room service appropriate? Yes      06/22/20 2156          DVT prophylaxis:      Code Status: Full Code  Family Communication:  no family at bedside   Status is: Inpatient  Remains inpatient appropriate because:Inpatient level of care appropriate due to severity of illness   Dispo: The patient is from: Home              Anticipated d/c is to: SNF              Anticipated d/c date is: > 3 days              Patient currently is not medically stable to d/c.  Consultants:  Podiatry  Orthopedic surgery  Procedures:  None   Microbiology  Blood cultures -  Wound cultures-GPC, pending  Antimicrobials: Vancomycin 8/22  >> Cefepime 8/22 >> Metronidazole 8/22 >>    Objective: Vitals:   06/22/20 2154 06/23/20 0300 06/23/20 0741 06/23/20 1027  BP: (!) 142/74 136/86 108/72 109/63  Pulse: 85 83 80 80  Resp: _0 Temp: 99.9 F (37.7 C) 98.1 F (36.7 C) 98.1 F (36.7 C) 97.6 F (36.4 C)  TempSrc: Oral Axillary Oral Oral  SpO2: 96% 97% 96% 99%  Weight:      Height:        Intake/Output Summary (Last 24 hours) at 06/23/2020 1040 Last data filed at 06/23/2020 0900 Gross per 24 hour  Intake 2396.37 ml  Output 420 ml  Net 1976.37 ml   Filed Weights   06/21/20 1339 06/21/20 2349 06/22/20 1855  Weight: 128.8 kg 132 kg 132 kg    Examination:  Constitutional: No distress, eating breakfast Eyes: No icterus ENMT: Moist mucous membranes Neck: normal, supple Respiratory: Clear bilaterally without wheezing or crackles Cardiovascular: Regular rate and rhythm, no murmurs Abdomen: Soft, NT, ND, bowel sounds positive Musculoskeletal: no clubbing / cyanosis.  Skin: Left lower extremity cellulitic rash, left foot amputated Neurologic: Nonfocal, equal strength   Data Reviewed: I have independently reviewed following labs and imaging studies   CBC: Recent Labs  Lab 06/21/20 1414 06/23/20 0539  WBC 8.8 6.9  NEUTROABS 7.1  --   HGB 8.5* 6.6*  HCT 27.8* 21.8*  MCV 86.9 85.2  PLT 374 932   Basic Metabolic Panel: Recent Labs  Lab 06/21/20 1414 06/23/20 0539  NA 133* 134*  K 4.3 5.2*  CL 102 103  CO2 22 24  GLUCOSE 280* 285*  BUN 16 11  CREATININE 1.24 1.07  CALCIUM 8.8* 8.6*  MG 1.9 1.7  PHOS 2.7 4.3   Liver Function Tests: Recent Labs  Lab 06/21/20 1414 06/23/20 0539  AST 19 11*  ALT 9 9  ALKPHOS 92 65  BILITOT 0.5 0.2*  PROT 8.5* 6.6  ALBUMIN 2.9* 1.9*   Coagulation Profile: Recent Labs  Lab 06/21/20 1414  INR 1.1   HbA1C: Recent Labs    06/21/20 1414  HGBA1C 6.0*   CBG: Recent Labs  Lab 06/22/20 1141 06/22/20 1810 06/22/20 2031 06/22/20 2349  06/23/20 0612  GLUCAP 138* 125* 124* 267* 266*    Recent Results (from the past 240 hour(s))  SARS Coronavirus 2 by RT PCR (hospital order, performed in [facility] hospital lab) Nasopharyngeal Nasopharyngeal Swab     Status: None   Collection Time: 06/21/20  2:09 PM   Specimen: Nasopharyngeal Swab  Result Value Ref Range Status   SARS Coronavirus 2 NEGATIVE NEGATIVE Final    Comment: (NOTE) SARS-CoV-2 target nucleic acids are NOT DETECTED.  The SARS-CoV-2 RNA is generally detectable in upper and lower respiratory specimens during the acute phase of infection. The lowest concentration of  SARS-CoV-2 viral copies this assay can detect is 250 copies / mL. A negative result does not preclude SARS-CoV-2 infection and should not be used as the sole basis for treatment or other patient management decisions.  A negative result may occur with improper specimen collection / handling, submission of specimen other than nasopharyngeal swab, presence of viral mutation(s) within the areas targeted by this assay, and inadequate number of viral copies (<250 copies / mL). A negative result must be combined with clinical observations, patient history, and epidemiological information.  Fact Sheet for Patients:   StrictlyIdeas.no  Fact Sheet for Healthcare Providers: BankingDealers.co.za  This test is not yet approved or  cleared by the [place] FDA and has been authorized for detection and/or diagnosis of SARS-CoV-2 by FDA under an Emergency Use Authorization (EUA).  This EUA will remain in effect (meaning this test can be used) for the duration of the COVID-19 declaration under Section 564(b)(1) of the Act, 21 U.S.C. section 360bbb-3(b)(1), unless the authorization is terminated or revoked sooner.  Performed at [facility], [address], [place], [place] [address]   Blood Culture (routine x 2)     Status: None (Preliminary result)    Collection Time: 06/21/20  2:16 PM   Specimen: Vein; Blood  Result Value Ref Range Status   Specimen Description   Final    RIGHT ANTECUBITAL BOTTLES DRAWN AEROBIC AND ANAEROBIC   Special Requests   Final    Blood Culture adequate volume Performed at [facility], [address] [place], [place] [address]    Culture PENDING  Incomplete   Report Status PENDING  Incomplete  Blood Culture (routine x 2)     Status: None (Preliminary result)   Collection Time: 06/21/20  2:16 PM   Specimen: Vein; Blood  Result Value Ref Range Status   Specimen Description   Final    LEFT ANTECUBITAL BOTTLES DRAWN AEROBIC AND ANAEROBIC   Special Requests   Final    Blood Culture adequate volume Performed at [facility], [address], [place], [place] [address]    Culture PENDING  Incomplete   Report Status PENDING  Incomplete  Urine culture     Status: Abnormal   Collection Time: 06/21/20  5:12 PM   Specimen: Urine, Catheterized  Result Value Ref Range Status   Specimen Description   Final    URINE, CATHETERIZED Performed at [facility], [address], [place], [place] [address]    Special Requests   Final    NONE Performed at [facility], [address] [place], [place] [address]    Culture (A)  Final    <10,000 COLONIES/mL INSIGNIFICANT GROWTH Performed at [facility], [place] [address] [place], [place] [address]    Report Status 06/22/2020 FINAL  Final  Surgical pcr screen     Status: Abnormal   Collection Time: 06/22/20  2:10 AM   Specimen: Nasal Mucosa; Nasal Swab  Result Value Ref Range Status   MRSA, PCR NEGATIVE NEGATIVE Final   Staphylococcus aureus POSITIVE (A) NEGATIVE Final    Comment: (NOTE) The Xpert SA Assay (FDA approved for NASAL specimens in patients 75 years of age and older), is one component of a comprehensive surveillance program. It is not intended to diagnose infection nor to guide or monitor treatment. Performed at [facility], [place] [address]  [place], [place] [address]   Aerobic/Anaerobic Culture (surgical/deep wound)     Status: None (Preliminary result)   Collection Time: 06/22/20  7:44 PM   Specimen: Foot, Left; Wound  Result Value Ref Range Status   Specimen Description WOUND LEFT FOOT  Final   Special Requests NONE  Final   Gram Stain   Final    RARE WBC PRESENT, PREDOMINANTLY PMN RARE GRAM POSITIVE COCCI Performed at [facility], [place] [address], [place], [place] [address]    Culture PENDING  Incomplete   Report Status PENDING  Incomplete  Aerobic/Anaerobic Culture (surgical/deep wound)     Status: None (Preliminary result)   Collection Time: 06/22/20  8:04 PM   Specimen: Bone; Tissue  Result Value Ref Range Status   Specimen Description BONE  Final   Special Requests TALUS  Final   Gram Stain   Final    NO WBC SEEN NO ORGANISMS SEEN Performed at [facility], [address] [place], [place] [address]    Culture PENDING  Incomplete   Report Status PENDING  Incomplete     Radiology Studies: No results found.  Marzetta Board, MD, PhD [facility]  Between 7 am - 7 pm I am available, please contact me via Amion or Securechat  Between 7 pm - 7 am I am not available, please contact night coverage MD/APP via Amion

## 2020-06-23 NOTE — Consult Note (Signed)
ORTHOPAEDIC CONSULTATION  REQUESTING PHYSICIAN: Caren Griffins, MD  Chief Complaint: Osteomyelitis abscess left ankle and hindfoot.  HPI: Eugene Diaz is a 54 y.o. male who presents with osteomyelitis abscess purulent drainage involving the ankle and hindfoot on the left.  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  Patient had an MRI scan yesterday morning that showed osteomyelitis and abscess involving the tibiotalar joint and the subtalar joint and patient underwent irrigation and debridement last night of the left show part amputation.  Past Medical History:  Diagnosis Date  . Alcohol abuse   . Alcoholic peripheral neuropathy ([place])   . Anxiety   . Arthritis   . Asthma    followed by pcp  . B12 deficiency   . Depression   . Diabetic neuropathy ([place])   . Edema of both lower extremities   . Essential tremor    neurologist-- dr patel--- due to alcohol abuse  . GERD (gastroesophageal reflux disease)   . History of cellulitis 2020   left lower leg;   recurrent 03-25-2020  . History of esophageal stricture    s/p  dilatation 02-13-2018  . History of osteomyelitis 2020   left great toe    . Hypercholesterolemia   . Hypertension    followed by pcp  (nuclear stress test 03-11-2014 low risk w/ no ischemia, ef 65%  . Normocytic anemia    followed by pcp   (03-18-2020 had transfusion's 02/ 2021)  . Peripheral vascular disease ([place])   . 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 Thrombocytopenia ([facility])    chronic  . Type 2 diabetes mellitus ([place])    followed by pcp---    (03-18-2020  pt  stated does not check blood sugar)   Past Surgical History:  Procedure Laterality Date  . AMPUTATION Left 10/16/2019   Procedure: Left partial second ray resection; placement of antibiotic beads;  Surgeon: Evelina Bucy, DPM;  [facility];  Service: Podiatry;  Laterality: Left;  . AMPUTATION Left 11/13/2019   Procedure: Left Midfoot Amputation - Transmetatarsal vs. Lisfranc; Placement antibiotic beads;  Surgeon: Evelina Bucy, DPM;  [facility];  Service: Podiatry;  Laterality: Left;  . AMPUTATION Left 12/21/2019   Procedure: Chopart Amputation left foot;  Surgeon: Evelina Bucy, DPM;  Location: [facility];  Service: Podiatry;  Laterality: Left;  . AMPUTATION TOE Left 08/14/2019   Procedure: AMPUTATION GREAT TOE;  Surgeon: Evelina Bucy, DPM;  [facility];  Service: Podiatry;  Laterality: Left;  . APPLICATION OF WOUND VAC Left 12/21/2019   Procedure: Application Of Wound Vac;  Surgeon: Evelina Bucy, DPM;  Location: [facility];  Service: Podiatry;  Laterality: Left;  . BIOPSY  02/13/2018   Procedure: BIOPSY;  Surgeon: Danie Binder, MD;  [facility];  Service: Endoscopy;;  transverse colon biopsy, gastric biopsy  . COLONOSCOPY WITH PROPOFOL N/A 02/13/2018   Procedure: COLONOSCOPY WITH PROPOFOL;  Surgeon: Danie Binder, MD;  [facility];  Service: Endoscopy;  Laterality: N/A;  11:15am  . ESOPHAGOGASTRODUODENOSCOPY (EGD) WITH PROPOFOL N/A 02/13/2018  Procedure: ESOPHAGOGASTRODUODENOSCOPY (EGD) WITH PROPOFOL;  Surgeon: Danie Binder, MD;  [facility];  Service: Endoscopy;  Laterality: N/A;  . I & D EXTREMITY Left 07/16/2019   Procedure: Insicion  AND DEBRIDEMENT EXTREMITY;  Surgeon: Evelina Bucy, DPM;  Location: [facility];  Service: Podiatry;  Laterality: Left;  . I & D EXTREMITY Left 06/22/2020   Procedure: IRRIGATION AND DEBRIDEMENT EXTREMITY, left foot and ankle;  Surgeon: Evelina Bucy, DPM;  Location: [facility];  Service: Podiatry;   Laterality: Left;  . INCISION AND DRAINAGE OF WOUND Left 10/13/2019   Procedure: IRRIGATION AND DEBRIDEMENT WOUND OF LEFT FOOT AND FIRST METATARSAL RESECTION;  Surgeon: Evelina Bucy, DPM;  [facility];  Service: Podiatry;  Laterality: Left;  . IRRIGATION AND DEBRIDEMENT FOOT Left 11/11/2019   Procedure: Left Foot Wound Irrigation and Debridement;  Surgeon: Evelina Bucy, DPM;  [facility];  Service: Podiatry;  Laterality: Left;  . Left arm     Left arm repair (tendon and artery)  . PERCUTANEOUS PINNING  07/16/2019   Procedure: Open Reduction Percutaneous Pinning Extremity;  Surgeon: Evelina Bucy, DPM;  Location: [facility];  Service: Podiatry;;  . PILONIDAL CYST EXCISION N/A 08/08/2014   Procedure: CYST EXCISION PILONIDAL EXTENSIVE;  Surgeon: Jamesetta So, MD;  [facility];  Service: General;  Laterality: N/A;  . POLYPECTOMY  02/13/2018   Procedure: POLYPECTOMY;  Surgeon: Danie Binder, MD;  [facility];  Service: Endoscopy;;  transverse colon polyp hs, rectal polyps times 2  . SAVORY DILATION N/A 02/13/2018   Procedure: SAVORY DILATION;  Surgeon: Danie Binder, MD;  [facility];  Service: Endoscopy;  Laterality: N/A;  . WOUND DEBRIDEMENT Left 09/04/2019   Procedure: DEBRIDEMENT WOUND WITH COMPLEX  REPAIR OF DEHISCENCE;  Surgeon: Evelina Bucy, DPM;  Location: [facility];  Service: Podiatry;  Laterality: Left;  Leave patient on stretcher  . WOUND DEBRIDEMENT Left 10/16/2019   Procedure: Left foot wound debridement and closure;  Surgeon: Evelina Bucy, DPM;  [facility];  Service: Podiatry;  Laterality: Left;  . WOUND DEBRIDEMENT Left 12/18/2019   Procedure: LEFT FOOT DEBRIDEMENT WITH PARTIAL INCISION OF INFECTED BONE;  Surgeon: Evelina Bucy, DPM;  Location: [facility];  Service: Podiatry;  Laterality: Left;  LEFT FOOT DEBRIDEMENT WITH PARTIAL INCISION OF INFECTED BONE   Social History   Socioeconomic History  . Marital  status: Widowed    Spouse name: Not on file  . Number of children: 0  . Years of education: Not on file  . Highest education level: 8th grade  Occupational History  . 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  Tobacco Use  . Smoking status: Former Smoker    Packs/day: 2.50    Years: 25.00    Pack years: 62.50    Types: Cigarettes    Quit date: 08/05/1984    Years since quitting: 35.9  . Smokeless tobacco: Never Used  Vaping Use  . Vaping Use: Never used  Substance and Sexual Activity  . Alcohol use: Yes    Alcohol/week: 50.0 standard drinks    Types: 50 Standard drinks or equivalent per week    Comment: Drinks 6 beers, liquor 4-6oz (both daily) x 35 years  . Drug use: Not Currently    Types: Marijuana    Comment: 03-18-2020  per pt none since 11/ 2020  . Sexual activity: Yes    Birth control/protection:  None  Other Topics Concern  . 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 Highest level of education:  8th grade   He lives with wife.  No children.   Left handed   Drinks soda, water-no coffee, no tea   Social Determinants of Health   Financial Resource Strain:   . Difficulty of Paying Living Expenses: Not on file  Food Insecurity:   . Worried About Charity fundraiser in the Last Year: Not on file  . Ran Out of Food in the Last Year: Not on file  Transportation Needs:   . Lack of Transportation (Medical): Not on file  . Lack of Transportation (Non-Medical): Not on file  Physical Activity:   . Days of Exercise per Week: Not on file  . Minutes of Exercise per Session: Not on file  Stress:   . Feeling of Stress : Not on file  Social Connections:   . Frequency of Communication with Friends and Family: Not on file  . Frequency of Social Gatherings with Friends and Family: Not on file  . Attends Religious Services: Not on file  . Active Member of Clubs or  Organizations: Not on file  . Attends Archivist Meetings: Not on file  . Marital Status: Not on file   Family History  Problem Relation Age of Onset  . Heart attack Mother        Living, 19  . Cancer Father        Deceased  . Healthy Brother   . Healthy Sister   . Colon cancer Neg Hx   . Colon polyps Neg Hx    - negative except otherwise stated in the family history section No Known Allergies Prior to Admission medications   Medication Sig Start Date End Date Taking? Authorizing Provider  albuterol (PROVENTIL HFA;VENTOLIN HFA) 108 (90 Base) MCG/ACT inhaler Inhale 2 puffs into the lungs every 6 (six) hours as needed for wheezing or shortness of breath. 10/31/18  Yes Hayden Rasmussen, MD  ALPRAZolam Duanne Moron) 1 MG tablet Take 0.5 tablets (0.5 mg total) by mouth 4 (four) times daily as needed for anxiety. 01/21/20  Yes Gherghe, Vella Redhead, MD  amitriptyline (ELAVIL) 100 MG tablet Take 100 mg by mouth at bedtime.   Yes [provider]  atorvastatin (LIPITOR) 40 MG tablet Take 40 mg by mouth every evening.  12/17/15  Yes [provider]  diclofenac Sodium (VOLTAREN) 1 % GEL Apply 1 application topically daily as needed (pain).   Yes [provider]  docusate sodium (COLACE) 100 MG capsule Take 100 mg by mouth daily as needed for mild constipation.   Yes [provider]  DULoxetine (CYMBALTA) 60 MG capsule Take 60 mg by mouth daily. 11/18/19  Yes [provider]  ferrous sulfate 325 (65 FE) MG tablet Take 1 tablet (325 mg total) by mouth daily. Patient taking differently: Take 325 mg by mouth daily.  11/15/19 11/14/20 Yes Shelly Coss, MD  furosemide (LASIX) 20 MG tablet Take 20 mg by mouth daily as needed. 03/18/20  Yes [provider]  gabapentin (NEURONTIN) 300 MG capsule Take 900mg  in the morning and at noon, then take 1200mg  at bedtime Patient taking differently: Take 900-1,200 mg by mouth See admin instructions. Take 3 capsules  (900 mg) by mouth every morning and at noon, take 4 capsules (1200 mg) at bedtime 06/14/19  Yes Narda Amber K, DO  glimepiride (AMARYL) 4 MG tablet Take 4 mg by mouth daily.  08/10/19  Yes [provider]  glycopyrrolate (ROBINUL) 1 MG tablet Take 1 mg by mouth 2 (two) times daily.  12/17/15  Yes [provider]  hydrOXYzine (ATARAX/VISTARIL) 25 MG tablet Take 25 mg by mouth 4 (four) times daily as needed. 05/18/20  Yes [provider]  ibuprofen (ADVIL) 400 MG tablet Take 1 tablet (400 mg total) by mouth every 8 (eight) hours as needed for moderate pain. 12/27/19  Yes Arrien, [name], MD  lisinopril (PRINIVIL,ZESTRIL) 20 MG tablet Take 20 mg by mouth daily.  12/30/15  Yes [provider]  morphine (MS CONTIN) 30 MG 12 hr tablet Take 30 mg by mouth every 12 (twelve) hours.  05/21/20  Yes [provider]  omeprazole (PRILOSEC) 20 MG capsule TAKE 1 CAPSULE WITH BREAKFAST AND SUPPER. Patient taking differently: Take 20 mg by mouth 2 (two) times daily before a meal.  11/19/19  Yes Mahala Menghini, PA-C  oxycodone (ROXICODONE) 30 MG immediate release tablet Take 30 mg by mouth 4 (four) times daily as needed. 05/08/20  Yes [provider]  primidone (MYSOLINE) 50 MG tablet Take 1 tablet (50 mg total) by mouth 2 (two) times daily. 06/14/19  Yes Patel, Donika K, DO  acetaminophen (TYLENOL) 325 MG tablet Take 2 tablets (650 mg total) by mouth every 6 (six) hours as needed for mild pain (or Fever >/= 101). Patient not taking: Reported on 06/21/2020 12/27/19   Arrien, [name], MD  clindamycin (CLEOCIN) 300 MG capsule Take 1 capsule (300 mg total) by mouth 2 (two) times daily. 04/22/20   Evelina Bucy, DPM   DG Chest 2 View  Result Date: 06/21/2020 CLINICAL DATA:  Question sepsis.  Evaluate for abnormality. EXAM: CHEST - 2 VIEW COMPARISON:  December 29, 2019 FINDINGS: No pneumothorax. The heart, hila, and mediastinum are normal. No pulmonary nodules  or masses. No focal infiltrates. IMPRESSION: No active cardiopulmonary disease. Electronically Signed   By: Dorise Bullion III M.D   On: 06/21/2020 14:31   DG Tibia/Fibula Left  Result Date: 06/21/2020 CLINICAL DATA:  Leg pain. Dressing on left leg with redness and swelling. EXAM: LEFT TIBIA AND FIBULA - 2 VIEW COMPARISON:  None. FINDINGS: Edema throughout the soft tissues. No soft tissue gas identified. There is periostitis associated with the distal tibia and fibula as well as osteopenia associated with both bones. There is also osteopenia in the posterior calcaneus, incompletely evaluated. Erosive changes seen along the posterior tibia on the lateral view. No other bony abnormalities. IMPRESSION: 1. Soft tissue edema throughout the lower leg without soft tissue gas. 2. Osteopenia in portions of the tibia, fibula, and posterior calcaneus. Apparent erosive change along the posterior tibia on the lateral view. Given the left foot partial amputation, these findings could be due to aggressive osteoporosis from lack of mobility. Osteomyelitis not excluded on this study. If there is clinical concern for osteomyelitis in the region of the ankle and hindfoot, MRI would be more sensitive and specific. Electronically Signed   By: Dorise Bullion III M.D   On: 06/21/2020 14:34   CT HEAD WO CONTRAST  Result Date: 06/21/2020 CLINICAL DATA:  Multiple falls with trauma to the head. EXAM: CT HEAD WITHOUT CONTRAST TECHNIQUE: Contiguous axial images were obtained from the base of the skull through the vertex without intravenous contrast. COMPARISON:  04/09/2020 FINDINGS: Brain: Mild age related volume loss. No evidence of old or acute focal infarction,  mass lesion, hemorrhage, hydrocephalus or extra-axial collection. Vascular: There is atherosclerotic calcification of the major vessels at the base of the brain. Skull: Negative Sinuses/Orbits: Clear/normal Other: None IMPRESSION: No acute CT finding.  Mild age related  volume loss. Electronically Signed   By: Nelson Chimes M.D.   On: 06/21/2020 17:01   MR TIBIA FIBULA LEFT W WO CONTRAST  Result Date: 06/22/2020 CLINICAL DATA:  Left leg pain, swelling, redness. EXAM: MRI OF LOWER LEFT EXTREMITY WITHOUT AND WITH CONTRAST TECHNIQUE: Multiplanar, multisequence MR imaging of the left tibia and fibula was performed both before and after administration of intravenous contrast. CONTRAST:  67mL GADAVIST GADOBUTROL 1 MMOL/ML IV SOLN COMPARISON:  X-ray 06/21/2020 FINDINGS: Technical note: Motion degraded examination. Extensive bone marrow edema centered at the left ankle and hindfoot with confluent low T1 marrow signal involving the calcaneus, talus, navicular, as well as the distal portions of the left tibia and fibula (series 7, images 16-25; series 14, images 71-81). Extensive bony destruction within the hindfoot. The midfoot is not well seen within the field of view, but is also likely involved. Soft tissue ulceration at the anteromedial aspect of the ankle with rim enhancing fluid collection extending to the tibiotalar and subtalar joints compatible with abscess and septic arthritis (series 34, images 19-25). Abscess measures up to 6.3 cm in maximal dimension. Additional small 1.0 cm abscess within Kager's fat (series 31, image 68). Diffuse soft tissue edema throughout the lower extremity. There is diffuse edema-like signal throughout the lower extremity musculature which may reflect myositis and/or denervation changes. Small amount of fluid tracks along the fascial margins of the lower extremity musculature within the calf. No rim enhancing fascial fluid collection. No intramuscular collection. IMPRESSION: 1. Extensive acute osteomyelitis centered at the left ankle and hindfoot, as described above. 2. Soft tissue ulceration at the anteromedial aspect of the ankle with a 6.3 cm deep soft tissue abscess and associated septic arthritis of the tibiotalar and subtalar joints. 3. Diffuse  edema-like signal throughout the lower extremity musculature which may reflect myositis and/or denervation changes. Electronically Signed   By: Davina Poke D.O.   On: 06/22/2020 08:10   - pertinent xrays, CT, MRI studies were reviewed and independently interpreted  Positive ROS: All other systems have been reviewed and were otherwise negative with the exception of those mentioned in the HPI and as above.  Physical Exam: General: Alert, no acute distress Psychiatric: Patient is competent for consent with normal mood and affect Lymphatic: No axillary or cervical lymphadenopathy Cardiovascular: No pedal edema Respiratory: No cyanosis, no use of accessory musculature GI: No organomegaly, abdomen is soft and non-tender    Images:  @ENCIMAGES @  Labs:  Lab Results  Component Value Date   HGBA1C 6.0 (H) 06/21/2020   HGBA1C 4.7 (L) 01/17/2020   HGBA1C 6.4 (H) 11/10/2019   ESRSEDRATE 120 (H) 06/21/2020   ESRSEDRATE 40 (H) 01/18/2020   ESRSEDRATE 97 (H) 11/10/2019   CRP 20.9 (H) 06/21/2020   CRP 14.1 (H) 01/19/2020   CRP 7.6 (H) 11/10/2019   REPTSTATUS PENDING 06/22/2020   GRAMSTAIN  06/22/2020    NO WBC SEEN NO ORGANISMS SEEN Performed at [facility], [place] [address], [place], [place] [address]    CULT PENDING 06/22/2020   LABORGA STAPHYLOCOCCUS AUREUS 12/18/2019   LABORGA ENTEROBACTER CLOACAE 12/18/2019   LABORGA ENTEROCOCCUS FAECALIS 12/18/2019    Lab Results  Component Value Date   ALBUMIN 1.9 (L) 06/23/2020   ALBUMIN 2.9 (L) 06/21/2020   ALBUMIN  3.0 (L) 04/09/2020   PREALBUMIN 14.0 (L) 10/11/2019    Neurologic: Patient does not have protective sensation bilateral lower extremities.   MUSCULOSKELETAL:   Skin: Examination patient has dermatitis with venous and lymphatic insufficiency of the left lower extremity with cellulitis around the ankle.  The surgical dressing is in place.  Most recent ankle-brachial indices 6 months ago showed biphasic flow at  the ankle and no significant peripheral vascular disease.  Patient has good hair growth in the calf and leg.  Patient's albumin is 1.9 with a hemoglobin A1c of 6.  Review of the MRI scan shows extensive osteomyelitis and abscess involving the distal tibia and talus with large involved abscess.  Assessment: Assessment: Diabetic insensate neuropathy with severe protein caloric malnutrition with abscess and ulceration left ankle and hindfoot status post foot salvage intervention with a Chopart amputation and status post debridement last night.  Plan: Patient's only option for limb salvage would be to proceed with a transtibial amputation.  Risks and benefits were discussed patient states he understands wished to proceed with surgery.  We will plan for a left transtibial amputation tomorrow Wednesday.  Patient has worked with Museum/gallery curator in the past and we will have [place] provide a limb protector and stump shrinker.  Thank you for the consult and the opportunity to see Eugene Diaz, [place] [phone] 9:54 AM

## 2020-06-23 NOTE — Progress Notes (Signed)
CRITICAL VALUE ALERT  Critical Value:  Hgb 6.6  Date & Time Notied:  06/23/20 @ 0656  Provider Notified: yes  Orders Received/Actions taken: waiting for orders!

## 2020-06-23 NOTE — Progress Notes (Signed)
No show - patient hospitalized

## 2020-06-24 ENCOUNTER — Inpatient Hospital Stay (HOSPITAL_COMMUNITY): Payer: Medicaid Other | Admitting: Anesthesiology

## 2020-06-24 ENCOUNTER — Encounter (HOSPITAL_COMMUNITY)
Admission: EM | Disposition: A | Discharge status: Rehabilitation Facility | Payer: Self-pay | Source: Home / Self Care | Attending: Internal Medicine

## 2020-06-24 ENCOUNTER — Encounter (HOSPITAL_COMMUNITY): Payer: Medicaid Other

## 2020-06-24 DIAGNOSIS — E11628 Type 2 diabetes mellitus with other skin complications: Secondary | ICD-10-CM

## 2020-06-24 DIAGNOSIS — M86272 Subacute osteomyelitis, left ankle and foot: Secondary | ICD-10-CM

## 2020-06-24 DIAGNOSIS — L089 Local infection of the skin and subcutaneous tissue, unspecified: Secondary | ICD-10-CM

## 2020-06-24 HISTORY — PX: AMPUTATION: SHX166

## 2020-06-24 LAB — COMPREHENSIVE METABOLIC PANEL
ALT: 10 U/L (ref 0–44)
AST: 9 U/L — ABNORMAL LOW (ref 15–41)
Albumin: 2 g/dL — ABNORMAL LOW (ref 3.5–5.0)
Alkaline Phosphatase: 61 U/L (ref 38–126)
Anion gap: 9 (ref 5–15)
BUN: 25 mg/dL — ABNORMAL HIGH (ref 6–20)
CO2: 21 mmol/L — ABNORMAL LOW (ref 22–32)
Calcium: 8.4 mg/dL — ABNORMAL LOW (ref 8.9–10.3)
Chloride: 99 mmol/L (ref 98–111)
Creatinine, Ser: 1.14 mg/dL (ref 0.61–1.24)
GFR calc Af Amer: >60 mL/min (ref >60)
GFR calc non Af Amer: >60 mL/min (ref >60)
Glucose, Bld: 369 mg/dL — ABNORMAL HIGH (ref 70–99)
Potassium: 4.8 mmol/L (ref 3.5–5.1)
Sodium: 129 mmol/L — ABNORMAL LOW (ref 135–145)
Total Bilirubin: <0.1 mg/dL — ABNORMAL LOW (ref 0.3–1.2)
Total Protein: 7.1 g/dL (ref 6.5–8.1)

## 2020-06-24 LAB — GLUCOSE, CAPILLARY
Glucose-Capillary: 332 mg/dL — ABNORMAL HIGH (ref 70–99)
Glucose-Capillary: 325 mg/dL — ABNORMAL HIGH (ref 70–99)
Glucose-Capillary: 280 mg/dL — ABNORMAL HIGH (ref 70–99)
Glucose-Capillary: 235 mg/dL — ABNORMAL HIGH (ref 70–99)
Glucose-Capillary: 210 mg/dL — ABNORMAL HIGH (ref 70–99)

## 2020-06-24 LAB — CBC
HCT: 23.5 % — ABNORMAL LOW (ref 39.0–52.0)
Hemoglobin: 7.3 g/dL — ABNORMAL LOW (ref 13.0–17.0)
MCH: 26.8 pg (ref 26.0–34.0)
MCHC: 31.1 g/dL (ref 30.0–36.0)
MCV: 86.4 fL (ref 80.0–100.0)
Platelets: 294 10*3/uL (ref 150–400)
RBC: 2.72 MIL/uL — ABNORMAL LOW (ref 4.22–5.81)
RDW: 15.6 % — ABNORMAL HIGH (ref 11.5–15.5)
WBC: 6.8 10*3/uL (ref 4.0–10.5)
nRBC: 0 % (ref 0.0–0.2)

## 2020-06-24 LAB — PREPARE RBC (CROSSMATCH)

## 2020-06-24 LAB — MAGNESIUM: Magnesium: 1.9 mg/dL (ref 1.7–2.4)

## 2020-06-24 SURGERY — AMPUTATION BELOW KNEE
Anesthesia: Monitor Anesthesia Care | Site: Knee | Laterality: Left

## 2020-06-24 MED ORDER — DEXAMETHASONE SODIUM PHOSPHATE 10 MG/ML IJ SOLN
INTRAMUSCULAR | Status: AC
  Filled 2020-06-24: qty 1

## 2020-06-24 MED ORDER — PROPOFOL 10 MG/ML IV BOLUS
INTRAVENOUS | Status: DC | PRN
  Administered 2020-06-24: 50 mg via INTRAVENOUS

## 2020-06-24 MED ORDER — FENTANYL CITRATE (PF) 100 MCG/2ML IJ SOLN
INTRAMUSCULAR | Status: AC
Start: 2020-06-24 — End: 2020-06-24
  Administered 2020-06-24: 100 ug via INTRAVENOUS
  Filled 2020-06-24: qty 2

## 2020-06-24 MED ORDER — BUPIVACAINE HCL (PF) 0.5 % IJ SOLN
INTRAMUSCULAR | Status: DC | PRN
  Administered 2020-06-24: 20 mL via PERINEURAL

## 2020-06-24 MED ORDER — ENOXAPARIN SODIUM 80 MG/0.8ML ~~LOC~~ SOLN
65.0000 mg | SUBCUTANEOUS | Status: DC
  Administered 2020-06-25 – 2020-06-30 (×6): 65 mg via SUBCUTANEOUS
  Filled 2020-06-24 (×6): qty 0.65

## 2020-06-24 MED ORDER — PROPOFOL 500 MG/50ML IV EMUL
INTRAVENOUS | Status: DC | PRN
  Administered 2020-06-24: 100 ug/kg/min via INTRAVENOUS

## 2020-06-24 MED ORDER — CHLORHEXIDINE GLUCONATE 0.12 % MT SOLN: 15.0000 mL | Freq: Once | OROMUCOSAL | Status: AC

## 2020-06-24 MED ORDER — MIDAZOLAM HCL 2 MG/2ML IJ SOLN: 2.0000 mg | Freq: Once | INTRAMUSCULAR | Status: AC

## 2020-06-24 MED ORDER — LACTATED RINGERS IV SOLN: INTRAVENOUS | Status: DC | PRN

## 2020-06-24 MED ORDER — ONDANSETRON HCL 4 MG/2ML IJ SOLN
INTRAMUSCULAR | Status: AC
  Filled 2020-06-24: qty 2

## 2020-06-24 MED ORDER — SUCCINYLCHOLINE CHLORIDE 200 MG/10ML IV SOSY
PREFILLED_SYRINGE | INTRAVENOUS | Status: AC
  Filled 2020-06-24: qty 10

## 2020-06-24 MED ORDER — OXYCODONE HCL 5 MG/5ML PO SOLN: 5.0000 mg | Freq: Once | ORAL | Status: DC | PRN

## 2020-06-24 MED ORDER — ROPIVACAINE HCL 5 MG/ML IJ SOLN
INTRAMUSCULAR | Status: DC | PRN
  Administered 2020-06-24: 30 mL via PERINEURAL

## 2020-06-24 MED ORDER — SODIUM CHLORIDE 0.9% IV SOLUTION: Freq: Once | INTRAVENOUS | Status: AC

## 2020-06-24 MED ORDER — MIDAZOLAM HCL 2 MG/2ML IJ SOLN
INTRAMUSCULAR | Status: AC
  Administered 2020-06-24: 2 mg via INTRAVENOUS
  Filled 2020-06-24: qty 2

## 2020-06-24 MED ORDER — CHLORHEXIDINE GLUCONATE 0.12 % MT SOLN
OROMUCOSAL | Status: AC
  Administered 2020-06-24: 15 mL via OROMUCOSAL
  Filled 2020-06-24: qty 15

## 2020-06-24 MED ORDER — INSULIN ASPART 100 UNIT/ML ~~LOC~~ SOLN: 10.0000 [IU] | SUBCUTANEOUS | Status: DC

## 2020-06-24 MED ORDER — FUROSEMIDE 20 MG PO TABS
20.0000 mg | ORAL_TABLET | Freq: Every day | ORAL | Status: DC
  Administered 2020-06-25 – 2020-06-30 (×6): 20 mg via ORAL
  Filled 2020-06-24 (×6): qty 1

## 2020-06-24 MED ORDER — ONDANSETRON HCL 4 MG/2ML IJ SOLN
INTRAMUSCULAR | Status: AC
  Filled 2020-06-24: qty 12

## 2020-06-24 MED ORDER — ACETAMINOPHEN 500 MG PO TABS
ORAL_TABLET | ORAL | Status: AC
  Filled 2020-06-24: qty 2

## 2020-06-24 MED ORDER — DOCUSATE SODIUM 100 MG PO CAPS
100.0000 mg | ORAL_CAPSULE | Freq: Two times a day (BID) | ORAL | Status: DC
  Administered 2020-06-24 – 2020-06-30 (×12): 100 mg via ORAL
  Filled 2020-06-24 (×12): qty 1

## 2020-06-24 MED ORDER — EPHEDRINE 5 MG/ML INJ
INTRAVENOUS | Status: AC
  Filled 2020-06-24: qty 30

## 2020-06-24 MED ORDER — OXYCODONE HCL 5 MG PO TABS: 5.0000 mg | ORAL_TABLET | Freq: Once | ORAL | Status: DC | PRN

## 2020-06-24 MED ORDER — FENTANYL CITRATE (PF) 100 MCG/2ML IJ SOLN: 100.0000 ug | Freq: Once | INTRAMUSCULAR | Status: AC

## 2020-06-24 MED ORDER — ONDANSETRON HCL 4 MG/2ML IJ SOLN: 4.0000 mg | Freq: Once | INTRAMUSCULAR | Status: DC | PRN

## 2020-06-24 MED ORDER — SODIUM CHLORIDE 0.9 % IV SOLN: INTRAVENOUS | Status: DC

## 2020-06-24 MED ORDER — INSULIN ASPART 100 UNIT/ML ~~LOC~~ SOLN
0.0000 [IU] | Freq: Three times a day (TID) | SUBCUTANEOUS | Status: DC
  Administered 2020-06-24: 11 [IU] via SUBCUTANEOUS
  Administered 2020-06-25 (×3): 5 [IU] via SUBCUTANEOUS
  Administered 2020-06-26: 3 [IU] via SUBCUTANEOUS
  Administered 2020-06-26: 8 [IU] via SUBCUTANEOUS
  Administered 2020-06-26 – 2020-06-27 (×2): 3 [IU] via SUBCUTANEOUS
  Administered 2020-06-27: 5 [IU] via SUBCUTANEOUS
  Administered 2020-06-27: 3 [IU] via SUBCUTANEOUS
  Administered 2020-06-28 (×2): 2 [IU] via SUBCUTANEOUS
  Administered 2020-06-28: 3 [IU] via SUBCUTANEOUS
  Administered 2020-06-29 (×2): 2 [IU] via SUBCUTANEOUS
  Administered 2020-06-29 – 2020-06-30 (×2): 3 [IU] via SUBCUTANEOUS
  Administered 2020-06-30: 5 [IU] via SUBCUTANEOUS
  Administered 2020-06-30: 3 [IU] via SUBCUTANEOUS

## 2020-06-24 MED ORDER — METOCLOPRAMIDE HCL 5 MG/ML IJ SOLN: 5.0000 mg | Freq: Three times a day (TID) | INTRAMUSCULAR | Status: DC | PRN

## 2020-06-24 MED ORDER — 0.9 % SODIUM CHLORIDE (POUR BTL) OPTIME
TOPICAL | Status: DC | PRN
  Administered 2020-06-24: 1000 mL

## 2020-06-24 MED ORDER — METOCLOPRAMIDE HCL 5 MG PO TABS: 5.0000 mg | ORAL_TABLET | Freq: Three times a day (TID) | ORAL | Status: DC | PRN

## 2020-06-24 MED ORDER — ROCURONIUM BROMIDE 10 MG/ML (PF) SYRINGE
PREFILLED_SYRINGE | INTRAVENOUS | Status: AC
  Filled 2020-06-24: qty 10

## 2020-06-24 MED ORDER — ONDANSETRON HCL 4 MG/2ML IJ SOLN
INTRAMUSCULAR | Status: DC | PRN
  Administered 2020-06-24: 4 mg via INTRAVENOUS

## 2020-06-24 MED ORDER — HYDROMORPHONE HCL 1 MG/ML IJ SOLN: 0.2500 mg | INTRAMUSCULAR | Status: DC | PRN

## 2020-06-24 MED ORDER — ACETAMINOPHEN 500 MG PO TABS
1000.0000 mg | ORAL_TABLET | Freq: Once | ORAL | Status: AC
  Administered 2020-06-24: 1000 mg via ORAL

## 2020-06-24 MED ORDER — DEXAMETHASONE SODIUM PHOSPHATE 10 MG/ML IJ SOLN
INTRAMUSCULAR | Status: AC
  Filled 2020-06-24: qty 2

## 2020-06-24 MED ORDER — INSULIN ASPART 100 UNIT/ML ~~LOC~~ SOLN
0.0000 [IU] | Freq: Every day | SUBCUTANEOUS | Status: DC
  Administered 2020-06-24 – 2020-06-29 (×5): 2 [IU] via SUBCUTANEOUS

## 2020-06-24 MED ORDER — INSULIN GLARGINE 100 UNIT/ML ~~LOC~~ SOLN
10.0000 [IU] | Freq: Every day | SUBCUTANEOUS | Status: DC
  Filled 2020-06-24: qty 0.1

## 2020-06-24 MED ORDER — PROPOFOL 10 MG/ML IV BOLUS
INTRAVENOUS | Status: AC
  Filled 2020-06-24: qty 20

## 2020-06-24 SURGICAL SUPPLY — 38 items
BLADE SAW RECIP 87.9 MT (BLADE) ×2 IMPLANT
BLADE SURG 21 STRL SS (BLADE) ×2 IMPLANT
BNDG COHESIVE 6X5 TAN STRL LF (GAUZE/BANDAGES/DRESSINGS) IMPLANT
CANISTER WOUND CARE 500ML ATS (WOUND CARE) ×2 IMPLANT
CANISTER WOUNDNEG PRESSURE 500 (CANNISTER) ×1 IMPLANT
COVER SURGICAL LIGHT HANDLE (MISCELLANEOUS) ×2 IMPLANT
COVER WAND RF STERILE (DRAPES) IMPLANT
CUFF TOURN SGL QUICK 34 (TOURNIQUET CUFF) ×2
CUFF TRNQT CYL 34X4.125X (TOURNIQUET CUFF) ×1 IMPLANT
DRAPE INCISE IOBAN 66X45 STRL (DRAPES) ×2 IMPLANT
DRAPE U-SHAPE 47X51 STRL (DRAPES) ×2 IMPLANT
DRESSING PREVENA PLUS CUSTOM (GAUZE/BANDAGES/DRESSINGS) ×1 IMPLANT
DRSG PREVENA PLUS CUSTOM (GAUZE/BANDAGES/DRESSINGS) ×2
DURAPREP 26ML APPLICATOR (WOUND CARE) ×2 IMPLANT
ELECT REM PT RETURN 9FT ADLT (ELECTROSURGICAL) ×2
ELECTRODE REM PT RTRN 9FT ADLT (ELECTROSURGICAL) ×1 IMPLANT
GLOVE BIOGEL PI IND STRL 9 (GLOVE) ×1 IMPLANT
GLOVE BIOGEL PI INDICATOR 9 (GLOVE) ×1
GLOVE SURG ORTHO 9.0 STRL STRW (GLOVE) ×2 IMPLANT
GOWN STRL REUS W/ TWL XL LVL3 (GOWN DISPOSABLE) ×2 IMPLANT
GOWN STRL REUS W/TWL XL LVL3 (GOWN DISPOSABLE) ×4
KIT BASIN OR (CUSTOM PROCEDURE TRAY) ×2 IMPLANT
KIT TURNOVER KIT B (KITS) ×2 IMPLANT
MANIFOLD NEPTUNE II (INSTRUMENTS) ×2 IMPLANT
NS IRRIG 1000ML POUR BTL (IV SOLUTION) ×2 IMPLANT
PACK ORTHO EXTREMITY (CUSTOM PROCEDURE TRAY) ×2 IMPLANT
PAD ARMBOARD 7.5X6 YLW CONV (MISCELLANEOUS) ×2 IMPLANT
PREVENA RESTOR ARTHOFORM 46X30 (CANNISTER) ×2 IMPLANT
SPONGE LAP 18X18 RF (DISPOSABLE) IMPLANT
STAPLER VISISTAT 35W (STAPLE) IMPLANT
STOCKINETTE IMPERVIOUS LG (DRAPES) ×2 IMPLANT
SUT ETHILON 2 0 PSLX (SUTURE) IMPLANT
SUT SILK 2 0 (SUTURE) ×2
SUT SILK 2-0 18XBRD TIE 12 (SUTURE) ×1 IMPLANT
SUT VIC AB 1 CTX 27 (SUTURE) ×4 IMPLANT
TOWEL GREEN STERILE (TOWEL DISPOSABLE) ×2 IMPLANT
TUBE CONNECTING 12X1/4 (SUCTIONS) ×2 IMPLANT
YANKAUER SUCT BULB TIP NO VENT (SUCTIONS) ×2 IMPLANT

## 2020-06-24 NOTE — Interval H&P Note (Signed)
History and Physical Interval Note:  06/24/2020 6:45 AM  Eugene Diaz  has presented today for surgery, with the diagnosis of Left Foot Osteomyelitis.  The various methods of treatment have been discussed with the patient and family. After consideration of risks, benefits and other options for treatment, the patient has consented to  Procedure(s): LEFT BELOW KNEE AMPUTATION (Left) as a surgical intervention.  The patient's history has been reviewed, patient examined, no change in status, stable for surgery.  I have reviewed the patient's chart and labs.  Questions were answered to the patient's satisfaction.     Newt Minion

## 2020-06-24 NOTE — Anesthesia Procedure Notes (Signed)
Anesthesia Regional Block: Adductor canal block   Pre-Anesthetic Checklist: ,, timeout performed, Correct Patient, Correct Site, Correct Laterality, Correct Procedure, Correct Position, site marked, Risks and benefits discussed,  Surgical consent,  Pre-op evaluation,  At surgeon's request and post-op pain management  Laterality: Left  Prep: chloraprep       Needles:  Injection technique: Single-shot  Needle Type: Stimiplex     Needle Length: 9cm  Needle Gauge: 21     Additional Needles:   Procedures:,,,, ultrasound used (permanent image in chart),,,,  Narrative:  Start time: 06/24/2020 5:46 PM End time: 06/24/2020 5:51 PM Injection made incrementally with aspirations every 5 mL.  Performed by: Personally  Anesthesiologist: Lynda Rainwater, MD

## 2020-06-24 NOTE — Plan of Care (Signed)

## 2020-06-24 NOTE — Progress Notes (Signed)
Pharmacy Antibiotic Note  Eugene Diaz is a 54 y.o. male admitted on 06/21/2020 with cellulitis/osteomyelitis  Pharmacy has been consulted for Vancomycin and cefepime.  Plan: Continue Vancomycin 1000mg  IV q12h Continue Cefepime 2gm IV q8h F/u post-op for abx plans F/U cxs and clinical progress Monitor V/S, labs and levels as indicated  Height: 6' 2.02" (188 cm) Weight: 132 kg (291 lb 0.1 oz) IBW/kg (Calculated) : 82.24  Temp (24hrs), Avg:97.9 F (36.6 C), Min:97.7 F (36.5 C), Max:98.2 F (36.8 C)  Crcl 105 ml/min   No Known Allergies  Antimicrobials this admission: Vancomycin 8/22>>  Cefepime 8/22>>   Dose adjustments this admission: prn  Microbiology results: 8/22 BCX; pending 8/22 UCX: TBC 8/23: Wound: GPC 8/23: Bone: GPC  Kanyla Omeara A. Levada Dy, PharmD, BCPS, FNKF Clinical Pharmacist [place] Please utilize [facility] for appropriate phone number to reach the unit pharmacist ([place])   06/24/2020 2:36 PM

## 2020-06-24 NOTE — Anesthesia Procedure Notes (Signed)
Anesthesia Regional Block: Popliteal block   Pre-Anesthetic Checklist: ,, timeout performed, Correct Patient, Correct Site, Correct Laterality, Correct Procedure, Correct Position, site marked, Risks and benefits discussed,  Surgical consent,  Pre-op evaluation,  At surgeon's request and post-op pain management  Laterality: Left  Prep: chloraprep       Needles:  Injection technique: Single-shot  Needle Type: Stimiplex     Needle Length: 9cm  Needle Gauge: 21     Additional Needles:   Procedures:,,,, ultrasound used (permanent image in chart),,,,  Narrative:  Start time: 06/24/2020 5:47 PM End time: 06/24/2020 5:52 PM Injection made incrementally with aspirations every 5 mL.  Performed by: Personally  Anesthesiologist: Lynda Rainwater, MD

## 2020-06-24 NOTE — Anesthesia Preprocedure Evaluation (Addendum)
Anesthesia Evaluation  Patient identified by MRN, date of birth, ID band Patient awake    Reviewed: Allergy & Precautions, NPO status , Patient's Chart, lab work & pertinent test results  Airway Mallampati: II  TM Distance: >3 FB Neck ROM: Full    Dental no notable dental hx. (+)  Extremely poor dentition:   Pulmonary neg pulmonary ROS, asthma , COPD, former smoker,  Quit smoking 1985- 62.5 pack year history  Uses rescue inhaler daily- uses 3-4x/d   Pulmonary exam normal breath sounds clear to auscultation       Cardiovascular hypertension, Pt. on medications + Peripheral Vascular Disease and +CHF  negative cardio ROS Normal cardiovascular exam Rhythm:Regular Rate:Normal  ECG: SR, rate 77  Last echo 2015: Mild LVH with LVEF 55-60%, grade 2  diastolic dysfunction. Mild MAC with trivial mtiral  regurgitation. Mildly sclerotic aortic valve. Mildly  reduced RV contraction. Unable to assess PASP. No  pericardial effusion.    Neuro/Psych PSYCHIATRIC DISORDERS Anxiety Depression Essential tremor Alcohol related peripheral neuropathy  Neuromuscular disease negative neurological ROS  negative psych ROS   GI/Hepatic negative GI ROS, Neg liver ROS, PUD, GERD  Medicated,(+)     substance abuse  alcohol use,   Endo/Other  negative endocrine ROSdiabetes, Type 2, Oral Hypoglycemic AgentsObesity BMI 37  Renal/GU negative Renal ROS  negative genitourinary   Musculoskeletal negative musculoskeletal ROS (+) Arthritis , narcotic dependentOsteomyelitis of left foot. Dehiscence of wound   Abdominal (+) + obese,   Peds negative pediatric ROS (+)  Hematology negative hematology ROS (+) anemia , HLD   Anesthesia Other Findings   Reproductive/Obstetrics negative OB ROS                           Anesthesia Physical Anesthesia Plan  ASA: III  Anesthesia Plan: Regional and MAC   Post-op Pain  Management:    Induction: Intravenous  PONV Risk Score and Plan: 1 and Ondansetron and Treatment may vary due to age or medical condition  Airway Management Planned: Simple Face Mask  Additional Equipment: None  Intra-op Plan:   Post-operative Plan:   Informed Consent: I have reviewed the patients History and Physical, chart, labs and discussed the procedure including the risks, benefits and alternatives for the proposed anesthesia with the patient or authorized representative who has indicated his/her understanding and acceptance.     Dental advisory given  Plan Discussed with: CRNA  Anesthesia Plan Comments:       Anesthesia Quick Evaluation

## 2020-06-24 NOTE — Progress Notes (Signed)
Progress Note    Eugene Diaz  GDJ:[id number] DOB: [birth year]  DOA: 06/21/2020 PCP: [name], PA-C    Brief Narrative:     Medical records reviewed and are as summarized below:  Eugene Diaz is an 54 y.o. male with medical history significant for left foot osteomyelitis and amputation, diabetes mellitus, hypertension, alcoholism.  Patient presented to the ED with complaints of increasing left leg redness, swelling, pain and yellowish drainage ongoing for several days to weeks.  Reports he has been treated with courses of antibiotics prescribed by his podiatrist.  Was initially taken to the OR by podiatry but found to have such significant osteomyelitis that BKA was recommended.    Assessment/Plan:   Principal Problem:   Diabetic infection of left foot ([place]) Active Problems:   ETOH abuse   Alcoholic peripheral neuropathy (HCC)   Diabetic neuropathy (HCC)   Sepsis ([place])   Subacute osteomyelitis, left ankle and foot ([place])   Depression with anxiety   Sepsis due to osteomyelitis  -patient met sepsis criteria on admission with fever, tachycardia, source.   - broad-spectrum antibiotics with Vanco, metronidazole, cefepime.  -MRI of the left fibula, which shows acute osteomyelitis in the left ankle and hindfoot, as well as abscess in the soft tissue and septic arthritis.  - Podiatry consulted, patient went to the OR on 8/23 for I&D but given the extent of the infection podiatry recommended surgical consult for BKA. -Dr. 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    Family Communication/Anticipated D/C date and plan/Code Status   DVT prophylaxis: Lovenox ordered to start tomm once ok with ortho Code Status: Full Code.  Family Communication: patient  Disposition Plan: Status is: Inpatient  Remains inpatient appropriate because:Inpatient level of care appropriate due to severity of illness   Dispo: The patient is from: Home              Anticipated d/c is to: tbd              Anticipated d/c date is: 2 days              Patient currently is not medically stable to d/c.         Medical Consultants:    Podiatry  ortho     Subjective:   No complaints other than being sleepy  Objective:    Vitals:   06/23/20 1924 06/23/20 2003 06/24/20 0327 06/24/20 0753  BP: 120/67 118/62 125/79 117/68  Pulse: 75 73 (!) 57 (!) 57  Resp: 16 17 15 17   Temp: 97.8 F (36.6 C) 97.7 F (36.5 C) 98.2 F (36.8 C) 97.8 F (36.6 C)  TempSrc: Axillary Oral  Oral  SpO2: 97% 100% 100% 99%  Weight:      Height:        Intake/Output Summary (Last 24 hours) at 06/24/2020 0948 Last data filed at 06/24/2020 0604 Gross per 24 hour  Intake 1635 ml  Output 3500 ml  Net -1865 ml   Filed Weights   06/21/20 1339 06/21/20 2349 06/22/20 1855  Weight: 128.8 kg 132 kg 132 kg    Exam:  General: Appearance:    Obese male in no acute distress     Lungs:     Clear to auscultation bilaterally, respirations unlabored  Heart:    Bradycardic. Normal rhythm. No murmurs, rubs, or gallops.   MS:   Left foot amputated  Neurologic:   Sleepy but will awaken and interact    Data Reviewed:   I have personally reviewed following labs and imaging studies:  Labs: Labs show the following:   Basic Metabolic Panel: Recent Labs  Lab 06/21/20 1414 06/21/20 1414 06/23/20 0539 06/24/20 0530  NA  133*  --  134* 129*  K 4.3   < > 5.2* 4.8  CL 102  --  103 99  CO2 22  --  24 21*  GLUCOSE 280*  --  285* 369*  BUN 16  --  11 25*  CREATININE 1.24  --  1.07 1.14  CALCIUM 8.8*  --  8.6* 8.4*  MG 1.9  --  1.7 1.9  PHOS 2.7  --  4.3  --    < > = values in this interval not displayed.   GFR Estimated Creatinine Clearance: 105.7 mL/min (by C-G formula based on SCr of 1.14 mg/dL). Liver Function Tests: Recent Labs  Lab 06/21/20 1414 06/23/20 0539 06/24/20 0530  AST 19 11* 9*  ALT 9 9 10   ALKPHOS 92 65 61  BILITOT 0.5 0.2* <0.1*  PROT 8.5* 6.6 7.1  ALBUMIN 2.9* 1.9* 2.0*   No results for input(s): LIPASE, AMYLASE in the last 168 hours. No results for input(s): AMMONIA in the last 168 hours. Coagulation profile Recent Labs  Lab 06/21/20 1414  INR 1.1    CBC: Recent Labs  Lab 06/21/20 1414 06/23/20 0539 06/24/20 0530  WBC 8.8 6.9 6.8  NEUTROABS 7.1  --   --   HGB 8.5* 6.6* 7.3*  HCT 27.8* 21.8* 23.5*  MCV 86.9 85.2 86.4  PLT 374 264 294   Cardiac Enzymes: No results for input(s): CKTOTAL, CKMB, CKMBINDEX, TROPONINI in the last 168 hours. BNP (last 3 results) No results for input(s): PROBNP in the last 8760 hours. CBG: Recent Labs  Lab 06/23/20 1145 06/23/20 1631 06/23/20 2033 06/23/20 2352 06/24/20 0639  GLUCAP 261* 282* 308* 368* 332*   D-Dimer: No results for input(s): DDIMER in the last 72 hours. Hgb A1c: Recent Labs    06/21/20 1414  HGBA1C 6.0*   Lipid Profile: No results for input(s): CHOL, HDL, LDLCALC, TRIG, CHOLHDL, LDLDIRECT in the last 72 hours. Thyroid function studies: No results for input(s): TSH, T4TOTAL, T3FREE, THYROIDAB in the last 72 hours.  Invalid input(s): FREET3 Anemia work up: No results for input(s): VITAMINB12, FOLATE, FERRITIN, TIBC, IRON, RETICCTPCT in the last 72 hours. Sepsis Labs: Recent Labs  Lab 06/21/20 1414 06/21/20 1559 06/23/20 0539 06/24/20 0530  WBC 8.8  --  6.9 6.8  LATICACIDVEN 2.2* 1.8  --   --       Microbiology Recent Results (from the past 240 hour(s))  SARS Coronavirus 2 by RT PCR (hospital order, performed in [facility] hospital lab) Nasopharyngeal Nasopharyngeal Swab     Status: None   Collection Time: 06/21/20  2:09 PM   Specimen: Nasopharyngeal Swab  Result Value Ref Range Status   SARS Coronavirus 2 NEGATIVE NEGATIVE Final    Comment: (  NOTE) SARS-CoV-2 target nucleic acids are NOT DETECTED.  The SARS-CoV-2 RNA is generally detectable in upper and lower respiratory specimens during the acute phase of infection. The lowest concentration of SARS-CoV-2 viral copies this assay can detect is 250 copies / mL. A negative result does not preclude SARS-CoV-2 infection and should not be used as the sole basis for treatment or other patient management decisions.  A negative result may occur with improper specimen collection / handling, submission of specimen other than nasopharyngeal swab, presence of viral mutation(s) within the areas targeted by this assay, and inadequate number of viral copies (<250 copies / mL). A negative result must be combined with clinical observations, patient history, and epidemiological information.  Fact Sheet for Patients:   StrictlyIdeas.no  Fact Sheet for Healthcare Providers: BankingDealers.co.za  This test is not yet approved or  cleared by the [place] FDA and has been authorized for detection and/or diagnosis of SARS-CoV-2 by FDA under an Emergency Use Authorization (EUA).  This EUA will remain in effect (meaning this test can be used) for the duration of the COVID-19 declaration under Section 564(b)(1) of the Act, 21 U.S.C. section 360bbb-3(b)(1), unless the authorization is terminated or revoked sooner.  Performed at [facility], [address], [place], [place] [address]   Blood Culture (routine x 2)     Status: None (Preliminary result)   Collection Time: 06/21/20  2:16 PM    Specimen: Right Antecubital; Blood  Result Value Ref Range Status   Specimen Description   Final    RIGHT ANTECUBITAL BOTTLES DRAWN AEROBIC AND ANAEROBIC   Special Requests Blood Culture adequate volume  Final   Culture   Final    NO GROWTH 3 DAYS Performed at [facility], [address], [place], [place] [address]    Report Status PENDING  Incomplete  Blood Culture (routine x 2)     Status: None (Preliminary result)   Collection Time: 06/21/20  2:16 PM   Specimen: Left Antecubital; Blood  Result Value Ref Range Status   Specimen Description   Final    LEFT ANTECUBITAL BOTTLES DRAWN AEROBIC AND ANAEROBIC   Special Requests Blood Culture adequate volume  Final   Culture   Final    NO GROWTH 3 DAYS Performed at [facility], [address], [place], [place] [address]    Report Status PENDING  Incomplete  Urine culture     Status: Abnormal   Collection Time: 06/21/20  5:12 PM   Specimen: Urine, Catheterized  Result Value Ref Range Status   Specimen Description   Final    URINE, CATHETERIZED Performed at [facility], [address], [place], [place] [address]    Special Requests   Final    NONE Performed at [facility], [address], [place], [place] [address]    Culture (A)  Final    <10,000 COLONIES/mL INSIGNIFICANT GROWTH Performed at [facility], [place] [address] [place], [place] [address]    Report Status 06/22/2020 FINAL  Final  Surgical pcr screen     Status: Abnormal   Collection Time: 06/22/20  2:10 AM   Specimen: Nasal Mucosa; Nasal Swab  Result Value Ref Range Status   MRSA, PCR NEGATIVE NEGATIVE Final   Staphylococcus aureus POSITIVE (A) NEGATIVE Final    Comment: (NOTE) The Xpert SA Assay (FDA approved for NASAL specimens in patients 1 years of age and older), is one component of a comprehensive surveillance program. It is not intended to diagnose infection  nor to guide or monitor treatment. Performed at [facility], [place] [address]  [place], [place] [address]   Aerobic/Anaerobic Culture (surgical/deep wound)     Status: None (Preliminary result)   Collection Time: 06/22/20  7:44 PM   Specimen: Foot, Left; Wound  Result Value Ref Range Status   Specimen Description WOUND LEFT FOOT  Final   Special Requests NONE  Final   Gram Stain   Final    RARE WBC PRESENT, PREDOMINANTLY PMN RARE GRAM POSITIVE COCCI    Culture   Final    TOO YOUNG TO READ Performed at [facility], [place] [address] [place], [place] [address]    Report Status PENDING  Incomplete  Aerobic/Anaerobic Culture (surgical/deep wound)     Status: None (Preliminary result)   Collection Time: 06/22/20  8:04 PM   Specimen: Bone; Tissue  Result Value Ref Range Status   Specimen Description BONE  Final   Special Requests TALUS  Final   Gram Stain NO WBC SEEN NO ORGANISMS SEEN   Final   Culture   Final    GRAM POSITIVE COCCI CRITICAL RESULT CALLED TO, READ BACK BY AND VERIFIED WITH: RN G.ANIRA AT 6433 ON 06/23/20 BY T.SAAD Performed at [facility], [place] [address], [place],  [address]    Report Status PENDING  Incomplete    Procedures and diagnostic studies:  No results found.  Medications:   . amitriptyline  100 mg Oral QHS  . atorvastatin  40 mg Oral QPM  . Chlorhexidine Gluconate Cloth  6 each Topical Q0600  . DULoxetine  60 mg Oral Daily  . folic acid  1 mg Oral Daily  . [START ON 06/25/2020] furosemide  20 mg Oral Daily  . gabapentin  900 mg Oral Daily   And  . gabapentin  1,200 mg Oral QHS  . glycopyrrolate  1 mg Oral BID  . insulin aspart  0-15 Units Subcutaneous TID WC  . insulin aspart  0-5 Units Subcutaneous QHS  . insulin glargine  10 Units Subcutaneous QHS  . lisinopril  20 mg Oral QHS  . morphine  30 mg Oral Q12H  . multivitamin with minerals  1 tablet Oral Daily  . mupirocin ointment  1 application Nasal BID  . pantoprazole  40 mg Oral Daily  . primidone  50 mg Oral BID  . thiamine  100 mg Oral Daily   Or    . thiamine  100 mg Intravenous Daily   Continuous Infusions: .  ceFAZolin (ANCEF) IV    . ceFEPime (MAXIPIME) IV 2 g (06/24/20 0604)  . metronidazole 500 mg (06/24/20 0532)  . vancomycin 1,000 mg (06/24/20 0316)     LOS: 3 days   Geradine Girt  [facility]   How to contact the [facility] Attending or Consulting provider [place] or covering provider during after hours [place], for this patient?  1. Check the care team in [facility] and look for a) attending/consulting TRH provider listed and b) the [facility] team listed 2. Log into [email or web address] and use [place]'s universal password to access. If you do not have the password, please contact the hospital operator. 3. Locate the [facility] provider you are looking for under [facility] and page to a number that you can be directly reached. 4. If you still have difficulty reaching the provider, please page the [facility] (Director on Call) for the Hospitalists listed on amion for assistance.  06/24/2020, 9:48 AM

## 2020-06-24 NOTE — Plan of Care (Signed)

## 2020-06-24 NOTE — Progress Notes (Signed)
ANTICOAGULATION CONSULT NOTE - Initial Consult  Pharmacy Consult for enoxaparin Indication: VTE prophylaxis  No Known Allergies  Patient Measurements: Height: 6' 2.02" (188 cm) Weight: 132 kg (291 lb 0.1 oz) IBW/kg (Calculated) : 82.24   Vital Signs: Temp: 97.8 F (36.6 C) (08/25 0753) Temp Source: Oral (08/25 0753) BP: 117/68 (08/25 0753) Pulse Rate: 57 (08/25 0753)  Labs: Recent Labs    06/21/20 1414 06/21/20 1414 06/23/20 0539 06/24/20 0530  HGB 8.5*   < > 6.6* 7.3*  HCT 27.8*  --  21.8* 23.5*  PLT 374  --  264 294  APTT 37*  --   --   --   LABPROT 13.9  --   --   --   INR 1.1  --   --   --   CREATININE 1.24  --  1.07 1.14   < > = values in this interval not displayed.    Estimated Creatinine Clearance: 105.7 mL/min (by C-G formula based on SCr of 1.14 mg/dL).   Medical History: Past Medical History:  Diagnosis Date  . Alcohol abuse   . Alcoholic peripheral neuropathy ([place])   . Anxiety   . Arthritis   . Asthma    followed by pcp  . B12 deficiency   . Depression   . Diabetic neuropathy ([place])   . Edema of both lower extremities   . Essential tremor    neurologist-- dr patel--- due to alcohol abuse  . GERD (gastroesophageal reflux disease)   . History of cellulitis 2020   left lower leg;   recurrent 03-25-2020  . History of esophageal stricture    s/p  dilatation 02-13-2018  . History of osteomyelitis 2020   left great toe    . Hypercholesterolemia   . Hypertension    followed by pcp  (nuclear stress test 03-11-2014 low risk w/ no ischemia, ef 65%  . Normocytic anemia    followed by pcp   (03-18-2020 had transfusion's 02/ 2021)  . Peripheral vascular disease ([place])   . 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 Thrombocytopenia (HCC)    chronic  . Type 2 diabetes mellitus ([place])    followed by pcp---    (03-18-2020  pt stated does not check blood sugar)    Assessment:54 y.o. male with medical history significant forleft foot osteomyelitis and amputation, diabetes mellitus, hypertension, alcoholism.  Patient presented to the ED with complaints of increasing left leg redness,swelling,pain and yellowish drainage ongoing for several days to weeks.   Patient to receive a BKA on 8/25 per Dr. Sharol Given. Pharmacy consulted to start enoxaparin for VTE prophylaxis on POD1 per hospitalist. Patient BMI >35, therefore plan to start 0.5mg /kg q24h dosing in late morning tomorrow.   Goal of Therapy:  Prevention of DVT/PE Monitor platelets by anticoagulation protocol: Yes   Plan:  Lovenox 65mg  SQ q24h starting 1100 on 8/26 Monitor for bleeding Pharmacy will sign off, but follow peripherally.   Valton Schwartz A. Levada Dy, PharmD, BCPS, FNKF Clinical Pharmacist [place] Please utilize Amion for appropriate phone number to reach the unit pharmacist ([place])   06/24/2020,10:04 AM

## 2020-06-24 NOTE — Anesthesia Postprocedure Evaluation (Signed)
Anesthesia Post Note  Patient: Eugene Diaz  Procedure(s) Performed: IRRIGATION AND DEBRIDEMENT EXTREMITY, left foot and ankle (Left )     Patient location during evaluation: PACU Anesthesia Type: MAC Level of consciousness: awake and alert Pain management: pain level controlled Vital Signs Assessment: post-procedure vital signs reviewed and stable Respiratory status: spontaneous breathing, nonlabored ventilation, respiratory function stable and patient connected to nasal cannula oxygen Cardiovascular status: stable and blood pressure returned to baseline Postop Assessment: no apparent nausea or vomiting Anesthetic complications: no   No complications documented.  Last Vitals:  Vitals:   06/24/20 0327 06/24/20 0753  BP: 125/79 117/68  Pulse: (!) 57 (!) 57  Resp: 15 17  Temp: 36.8 C 36.6 C  SpO2: 100% 99%    Last Pain:  Vitals:   06/24/20 0753  TempSrc: Oral  PainSc:                  [name]

## 2020-06-24 NOTE — Anesthesia Procedure Notes (Signed)
Procedure Name: MAC Date/Time: 06/24/2020 6:31 PM Performed by: Jearld Pies, CRNA Pre-anesthesia Checklist: Patient identified, Emergency Drugs available, Suction available and Patient being monitored Patient Re-evaluated:Patient Re-evaluated prior to induction Oxygen Delivery Method: Simple face mask Preoxygenation: Pre-oxygenation with 100% oxygen

## 2020-06-24 NOTE — Anesthesia Postprocedure Evaluation (Signed)
Anesthesia Post Note  Patient: Eugene Diaz  Procedure(s) Performed: LEFT BELOW KNEE AMPUTATION (Left Knee)     Patient location during evaluation: PACU Anesthesia Type: Regional Level of consciousness: awake and alert Pain management: pain level controlled Vital Signs Assessment: post-procedure vital signs reviewed and stable Respiratory status: spontaneous breathing Cardiovascular status: stable Anesthetic complications: no   No complications documented.  Last Vitals:  Vitals:   06/24/20 2000 06/24/20 2014  BP: 126/79 120/75  Pulse: (!) 58 (!) 57  Resp: (!) 9 17  Temp: (!) 36.4 C (!) 36.3 C  SpO2: 100% 100%    Last Pain:  Vitals:   06/24/20 2014  TempSrc: Oral  PainSc: 0-No pain                 Nolon Nations

## 2020-06-24 NOTE — Transfer of Care (Signed)
Immediate Anesthesia Transfer of Care Note  Patient: Eugene Diaz  Procedure(s) Performed: LEFT BELOW KNEE AMPUTATION (Left Knee)  Patient Location: PACU  Anesthesia Type:MAC  Level of Consciousness: drowsy and responds to stimulation  Airway & Oxygen Therapy: Patient Spontanous Breathing and Patient connected to face mask oxygen  Post-op Assessment: Report given to RN and Post -op Vital signs reviewed and stable  Post vital signs: Reviewed and stable  Last Vitals:  Vitals Value Taken Time  BP 101/62 06/24/20 1919  Temp    Pulse 58 06/24/20 1922  Resp 8 06/24/20 1922  SpO2 100 % 06/24/20 1922  Vitals shown include unvalidated device data.  Last Pain:  Vitals:   06/24/20 1756  TempSrc: Oral  PainSc:       Patients Stated Pain Goal: 3 (62/70/35 0093)  Complications: No complications documented.

## 2020-06-24 NOTE — Op Note (Signed)
   Date of Surgery: 06/24/2020  INDICATIONS: Mr. Hedgepath is a 54 y.o.-year-old male who presents with abscess and osteomyelitis left ankle and residual left foot.  Patient presents at this time for transtibial amputation.Marland Kitchen  PREOPERATIVE DIAGNOSIS: Abscess osteomyelitis left ankle and left hindfoot  POSTOPERATIVE DIAGNOSIS: Same.  PROCEDURE: Transtibial amputation Application of Prevena wound VAC  SURGEON: Sharol Given, M.D.  ANESTHESIA:  general  IV FLUIDS AND URINE: See anesthesia.  ESTIMATED BLOOD LOSS: Minimal mL.  COMPLICATIONS: None.  DESCRIPTION OF PROCEDURE: The patient was brought to the operating room and underwent a general anesthetic. After adequate levels of anesthesia were obtained patient's lower extremity was prepped using DuraPrep draped into a sterile field. A timeout was called. The foot was draped out of the sterile field with impervious stockinette. A transverse incision was made 11 cm distal to the tibial tubercle. This curved proximally and a large posterior flap was created. The tibia was transected 1 cm proximal to the skin incision. The fibula was transected just proximal to the tibial incision. The tibia was beveled anteriorly. A large posterior flap was created. The sciatic nerve was pulled cut and allowed to retract. The vascular bundles were suture ligated with 2-0 silk. The deep and superficial fascial layers were closed using #1 Vicryl. The skin was closed using staples and 2-0 nylon. The wound was covered with a Prevena wound VAC. There was a good suction fit. A prosthetic shrinker was applied. Patient was extubated taken to the PACU in stable condition.   DISCHARGE PLANNING:  Antibiotic duration: Continue IV antibiotics for 3 days  Weightbearing: Nonweightbearing on the left  Pain medication: Opioid pathway  Dressing care/ Wound VAC: Continue wound VAC for 1 week  Discharge to: Anticipate discharge to skilled nursing.  Follow-up: In the office 1 week post  operative.  Meridee Score, MD [place] 7:24 PM

## 2020-06-25 ENCOUNTER — Encounter (HOSPITAL_COMMUNITY): Payer: Self-pay | Admitting: Orthopedic Surgery

## 2020-06-25 LAB — TYPE AND SCREEN
ABO/RH(D): A POS
Antibody Screen: NEGATIVE
Unit division: 0
Unit division: 0

## 2020-06-25 LAB — BPAM RBC
Blood Product Expiration Date: 202109192359
Blood Product Expiration Date: 202109222359
ISSUE DATE / TIME: 202108241035
ISSUE DATE / TIME: 202108251530
Unit Type and Rh: 6200
Unit Type and Rh: 6200

## 2020-06-25 LAB — BASIC METABOLIC PANEL
Anion gap: 8 (ref 5–15)
BUN: 21 mg/dL — ABNORMAL HIGH (ref 6–20)
CO2: 23 mmol/L (ref 22–32)
Calcium: 8.4 mg/dL — ABNORMAL LOW (ref 8.9–10.3)
Chloride: 103 mmol/L (ref 98–111)
Creatinine, Ser: 1.06 mg/dL (ref 0.61–1.24)
GFR calc Af Amer: >60 mL/min (ref >60)
GFR calc non Af Amer: >60 mL/min (ref >60)
Glucose, Bld: 245 mg/dL — ABNORMAL HIGH (ref 70–99)
Potassium: 4.4 mmol/L (ref 3.5–5.1)
Sodium: 134 mmol/L — ABNORMAL LOW (ref 135–145)

## 2020-06-25 LAB — CBC
HCT: 25.2 % — ABNORMAL LOW (ref 39.0–52.0)
Hemoglobin: 7.7 g/dL — ABNORMAL LOW (ref 13.0–17.0)
MCH: 26.4 pg (ref 26.0–34.0)
MCHC: 30.6 g/dL (ref 30.0–36.0)
MCV: 86.3 fL (ref 80.0–100.0)
Platelets: 266 10*3/uL (ref 150–400)
RBC: 2.92 MIL/uL — ABNORMAL LOW (ref 4.22–5.81)
RDW: 15.6 % — ABNORMAL HIGH (ref 11.5–15.5)
WBC: 5 10*3/uL (ref 4.0–10.5)
nRBC: 0 % (ref 0.0–0.2)

## 2020-06-25 LAB — GLUCOSE, CAPILLARY
Glucose-Capillary: 225 mg/dL — ABNORMAL HIGH (ref 70–99)
Glucose-Capillary: 206 mg/dL — ABNORMAL HIGH (ref 70–99)
Glucose-Capillary: 234 mg/dL — ABNORMAL HIGH (ref 70–99)
Glucose-Capillary: 236 mg/dL — ABNORMAL HIGH (ref 70–99)

## 2020-06-25 LAB — VANCOMYCIN, TROUGH: Vancomycin Tr: 19 ug/mL (ref 15–20)

## 2020-06-25 MED ORDER — ALBUTEROL SULFATE (2.5 MG/3ML) 0.083% IN NEBU
3.0000 mL | INHALATION_SOLUTION | Freq: Four times a day (QID) | RESPIRATORY_TRACT | Status: DC | PRN
  Administered 2020-06-25: 3 mL via RESPIRATORY_TRACT
  Filled 2020-06-25: qty 3

## 2020-06-25 MED ORDER — GABAPENTIN 300 MG PO CAPS
900.0000 mg | ORAL_CAPSULE | Freq: Two times a day (BID) | ORAL | Status: DC
  Administered 2020-06-25 – 2020-06-30 (×11): 900 mg via ORAL
  Filled 2020-06-25 (×11): qty 3

## 2020-06-25 MED ORDER — GABAPENTIN 400 MG PO CAPS
1200.0000 mg | ORAL_CAPSULE | Freq: Every day | ORAL | Status: DC
  Administered 2020-06-25 – 2020-06-29 (×5): 1200 mg via ORAL
  Filled 2020-06-25 (×5): qty 3

## 2020-06-25 NOTE — Progress Notes (Signed)
Inpatient Diabetes Program Recommendations  AACE/ADA: New Consensus Statement on Inpatient Glycemic Control (2015)  Target Ranges:  Prepandial:   less than 140 mg/dL      Peak postprandial:   less than 180 mg/dL (1-2 hours)      Critically ill patients:  140 - 180 mg/dL   Lab Results  Component Value Date   GLUCAP 225 (H) 06/25/2020   HGBA1C 6.0 (H) 06/21/2020    Review of Glycemic Control  8/25 CBGs: 235-369 mg/dL 8/26 CBG: 225 mg/dL Eating 100%. HgbA1C - 6%  Needs insulin titration for tight glycemic control for healing  Inpatient Diabetes Program Recommendations:     Add Levemir 5 units bid Add Novolog 3 units tidwc for meal coverage insulin  Continue to follow.  Thank you. Lorenda Peck, RD, LDN, CDE Inpatient Diabetes Coordinator [phone]

## 2020-06-25 NOTE — Evaluation (Signed)
Occupational Therapy Evaluation Patient Details Name: Eugene Diaz MRN: [id number] DOB: December 10, 1965 Today's Date: 06/25/2020    History of Present Illness Per Chart: "Date of Surgery: 06/24/2020 INDICATIONS: Mr. [place] is a 54 y.o.-year-old male who presents with abscess and osteomyelitis left ankle and residual left foot.  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   Clinical Impression   Patient found long sitting in bed eating lunch.  Patient with good participation and very motivate to improve his overall functional and mobility independence.  Patient demo's good tolerance to therapy, and will be able to participate in high intensity rehab.  Patient lives alone, will have assist, but it will not be enough to cover heavy ADL and mobility status.  OT to continue to follow in the acute sitting until d/c to next level of care.      Follow Up Recommendations  CIR    Equipment Recommendations  3 in 1 bedside commode;Wheelchair (measurements OT);Tub/shower seat    Recommendations for Other Services       Precautions / Restrictions Precautions Required Braces or Orthoses: Other Brace Other Brace: Limb protector Restrictions Weight Bearing Restrictions: Yes LLE Weight Bearing: Non weight bearing      Mobility Bed Mobility Overal bed mobility: Needs Assistance                Transfers Overall transfer level: Needs assistance Equipment used: Rolling walker (2 wheeled) Transfers: Sit to/from Stand Sit to Stand: +2 physical assistance              Balance Overall balance assessment: Needs assistance Sitting-balance support: Bilateral upper extremity supported (Standing at RW level.  Able to sit unsupported EOB)                                       ADL either performed or assessed with clinical judgement   ADL Overall ADL's :  Needs assistance/impaired Eating/Feeding: Set up   Grooming: Set up   Upper Body Bathing: Set up;Supervision/ safety   Lower Body Bathing: Set up;Moderate assistance;Cueing for safety;Cueing for sequencing;Sitting/lateral leans   Upper Body Dressing : Set up;Sitting   Lower Body Dressing: Supervision/safety;Set up;Moderate assistance;Sitting/lateral leans   Toilet Transfer: +2 for physical assistance;Cueing for sequencing;Stand-pivot;RW Toilet Transfer Details (indicate cue type and reason): VC's for RW management Toileting- Clothing Manipulation and Hygiene: Moderate assistance;Sitting/lateral lean       Functional mobility during ADLs: +2 for physical assistance;Rolling walker       Vision Baseline Vision/History: No visual deficits       Perception     Praxis      Pertinent Vitals/Pain Pain Assessment: 0-10 Pain Score: 5  Pain Descriptors / Indicators: Tightness;Sharp;Spasm Pain Intervention(s): Monitored during session;Other (comment) (re-positioned as needed)     Hand Dominance Left   Extremity/Trunk Assessment Upper Extremity Assessment Upper Extremity Assessment: Overall WFL for tasks assessed   Lower Extremity Assessment Lower Extremity Assessment: Defer to PT evaluation   Cervical / Trunk Assessment Cervical / Trunk Assessment: Normal   Communication Communication Communication: No difficulties;Other (comment) (Mild decreased processing noted with eval d/t pain meds.)   Cognition Arousal/Alertness: Awake/alert   Overall Cognitive Status: Within Functional Limits for tasks assessed  General Comments  L residual limb heavily bandaged with wound vac in place    Exercises     Shoulder Instructions      Home Living Family/patient expects to be discharged to:: Other (Comment) (Mobile Home) Living Arrangements: Alone Available Help at Discharge: Family;Friend(s);Other (Comment);Available 24  hours/day (Patient's Niece can assist for meals and home management.  Unclear if she will assist with bathing and dressing.  He will require heavy assist at home for basic function.) Type of Home: Mobile home Home Access: Stairs to enter Entrance Stairs-Number of Steps: One STE to a front porch, 4" to navigate to front door. Entrance Stairs-Rails: None Home Layout: One level     Bathroom Shower/Tub: Teacher, early years/pre: Standard Bathroom Accessibility: Yes How Accessible: Other (comment) (Bathroom doors may be too narrow for RW) Home Equipment: Walker - 2 wheels   Additional Comments: Neice supposted to stay with him      Prior Functioning/Environment Level of Independence: Independent with assistive device(s)        Comments: used crutches at home        OT Problem List: Decreased strength;Impaired balance (sitting and/or standing);Pain;Decreased range of motion;Increased edema;Impaired sensation      OT Treatment/Interventions: Self-care/ADL training;Therapeutic exercise;Neuromuscular education;Therapeutic activities    OT Goals(Current goals can be found in the care plan section) Acute Rehab OT Goals Patient Stated Goal: I need to be able to take care of myself. OT Goal Formulation: With patient Time For Goal Achievement: 07/03/20 Potential to Achieve Goals: Good  OT Frequency: Min 2X/week   Barriers to D/C: Decreased caregiver support          Co-evaluation PT/OT/SLP Co-Evaluation/Treatment: Yes Reason for Co-Treatment: Complexity of the patient's impairments (multi-system involvement);For patient/therapist safety;To address functional/ADL transfers   OT goals addressed during session: ADL's and self-care      AM-PAC OT "6 Clicks" Daily Activity     Outcome Measure Help from another person eating meals?: A Little Help from another person taking care of personal grooming?: A Little Help from another person toileting, which includes using  toliet, bedpan, or urinal?: A Lot Help from another person bathing (including washing, rinsing, drying)?: A Lot Help from another person to put on and taking off regular upper body clothing?: A Lot Help from another person to put on and taking off regular lower body clothing?: A Lot 6 Click Score: 14   End of Session Equipment Utilized During Treatment: Gait belt;Rolling walker Nurse Communication: Mobility status  Activity Tolerance: Patient tolerated treatment well Patient left: in chair;with chair alarm set;with nursing/sitter in room  OT Visit Diagnosis: Unsteadiness on feet (R26.81);Pain Pain - Right/Left: Left Pain - part of body: Knee                Time:  -    Charges:  OT Evaluation $OT Eval Moderate Complexity: 1 Mod  Myrle Dues D Yeray Tomas, OTR/L 06/25/2020, 2:23 PM

## 2020-06-25 NOTE — Evaluation (Signed)
Physical Therapy Evaluation Patient Details Name: Eugene Diaz MRN: [id number] DOB: [birth year] Today's Date: 06/25/2020   History of Present Illness  Eugene Diaz is an 54 y.o. male with medical history significant for left foot osteomyelitis and amputation, diabetes mellitus, hypertension, alcoholism.  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  Clinical Impression  Patient seen with OT for safety with new L BKA.  He presents with decreased mobility due to pain, weakness, decreased balance, decreased safety awareness and (per chart) history of falls.  He will benefit from skilled PT in the acute setting to allow d/c home with family support following CIR level rehab stay.     Follow Up Recommendations CIR    Equipment Recommendations  Wheelchair cushion (measurements PT);Wheelchair (measurements PT)    Recommendations for Other Services Rehab consult     Precautions / Restrictions Precautions Precautions: Fall Required Braces or Orthoses: Other Brace Other Brace: Limb protector Restrictions Weight Bearing Restrictions: Yes LLE Weight Bearing: Non weight bearing      Mobility  Bed Mobility Overal bed mobility: Needs Assistance                Transfers Overall transfer level: Needs assistance Equipment used: Rolling walker (2 wheeled) Transfers: Sit to/from Stand;Stand Pivot Transfers Sit to Stand: +2 physical assistance Stand pivot transfers: +2 physical assistance;Min assist       General transfer comment: assist to stand from EOB with lifting help; stand step to recliner with RW and min +2 A for balance and safety  Ambulation/Gait             General Gait Details: NT, pt wobbly following pain meds  Stairs            Wheelchair Mobility    Modified Rankin (Stroke Patients Only)       Balance Overall balance assessment: Needs assistance Sitting-balance support: Bilateral upper extremity  supported (Standing at RW level.  Able to sit unsupported EOB) Sitting balance-Leahy Scale: Fair Sitting balance - Comments: sitting EOB with S   Standing balance support: Bilateral upper extremity supported Standing balance-Leahy Scale: Poor Standing balance comment: UE support in standing                             Pertinent Vitals/Pain Pain Assessment: 0-10 Pain Score: 5  Pain Location: L LE Pain Descriptors / Indicators: Tightness;Sharp;Spasm Pain Intervention(s): Monitored during session;Premedicated before session    [place] expects to be discharged to:: Private residence Living Arrangements: Alone Available Help at Discharge: Family;Friend(s);Other (Comment);Available 24 hours/day Type of Home: Mobile home Home Access: Stairs to enter Entrance Stairs-Rails: None Entrance Stairs-Number of Steps: One STE to a front porch, 4" to navigate to front door. Home Layout: One level Home Equipment: Walker - 2 wheels Additional Comments: Neice supposted to stay with him    Prior Function Level of Independence: Independent with assistive device(s)         Comments: used crutches at home     Hand Dominance   Dominant Hand: Left    Extremity/Trunk Assessment   Upper Extremity Assessment Upper Extremity Assessment: Defer to OT evaluation    Lower Extremity Assessment Lower Extremity Assessment: LLE deficits/detail;RLE deficits/detail RLE Deficits / Details: noted area of warmth and errythema on mid shin, AROM limited knee flexion about 80 in supine, improved in sitting, strength at least 4/5 LLE Deficits / Details: L  BKA with wound vac noted SLR and knee extension WFL, limited knee flexion with some edema present, did not test strength due to pain at least 3/5    Cervical / Trunk Assessment Cervical / Trunk Assessment: Normal  Communication   Communication: No difficulties;Other (comment)  Cognition Arousal/Alertness:  Awake/alert Behavior During Therapy: WFL for tasks assessed/performed Overall Cognitive Status: Impaired/Different from baseline Area of Impairment: Attention;Following commands;Problem solving                   Current Attention Level: Sustained   Following Commands: Follows one step commands consistently;Follows one step commands with increased time     Problem Solving: Slow processing General Comments: likely limited due to medication effects      General Comments General comments (skin integrity, edema, etc.): L residual limb heavily bandaged with wound vac in place    Exercises     Assessment/Plan    PT Assessment Patient needs continued PT services  PT Problem List Decreased strength;Decreased mobility;Decreased safety awareness;Decreased balance;Decreased knowledge of use of DME;Pain;Decreased activity tolerance       PT Treatment Interventions DME instruction;Therapeutic activities;Gait training;Therapeutic exercise;Patient/family education;Wheelchair mobility training;Balance training;Functional mobility training    PT Goals (Current goals can be found in the Care Plan section)  Acute Rehab PT Goals Patient Stated Goal: agreeable to rehab in hospital PT Goal Formulation: With patient Time For Goal Achievement: 07/09/20 Potential to Achieve Goals: Good    Frequency Min 4X/week   Barriers to discharge        Co-evaluation PT/OT/SLP Co-Evaluation/Treatment: Yes Reason for Co-Treatment: To address functional/ADL transfers;For patient/therapist safety PT goals addressed during session: Mobility/safety with mobility;Balance;Proper use of DME OT goals addressed during session: ADL's and self-care       AM-PAC PT "6 Clicks" Mobility  Outcome Measure Help needed turning from your back to your side while in a flat bed without using bedrails?: A Little Help needed moving from lying on your back to sitting on the side of a flat bed without using bedrails?: A  Little Help needed moving to and from a bed to a chair (including a wheelchair)?: A Lot Help needed standing up from a chair using your arms (e.g., wheelchair or bedside chair)?: A Lot Help needed to walk in hospital room?: Total Help needed climbing 3-5 steps with a railing? : Total 6 Click Score: 12    End of Session Equipment Utilized During Treatment: Gait belt Activity Tolerance: Patient tolerated treatment well Patient left: in chair;with call bell/phone within reach;with chair alarm set Nurse Communication: Mobility status PT Visit Diagnosis: Other abnormalities of gait and mobility (R26.89);Pain;Difficulty in walking, not elsewhere classified (R26.2) Pain - Right/Left: Left Pain - part of body: Leg    Time: 4128-7867 PT Time Calculation (min) (ACUTE ONLY): 34 min   Charges:   PT Evaluation $PT Eval Moderate Complexity: 1 Mod          Eugene Diaz, PT Acute Rehabilitation Services [facility]N:[phone] Office:[phone] 06/25/2020   Eugene Diaz 06/25/2020, 5:19 PM

## 2020-06-25 NOTE — Progress Notes (Signed)
Orthopedic Tech Progress Note Patient Details:  [name] Sep 30, 1966 [id number] Called in brace. Patient ID: Eugene Diaz, male   DOB: 27-Jan-1966, 54 y.o.   MRN: [id number]   Ellouise Newer 06/25/2020, 8:03 AM

## 2020-06-25 NOTE — Progress Notes (Signed)
Pharmacy Antibiotic Note  Eugene Diaz is a 54 y.o. male admitted on 06/21/2020 with cellulitis/osteomyelitis  Pharmacy has been consulted for vancomycin and cefepime. 8/26 Vancomycin trough drawn 11 hours 17 min after previous dose is therapeutic at 19. SCr 1.06. Plan to continue IV antibiotics at least 3 days post-op (8/28) per Ortho.  Plan: Continue Vancomycin 1000mg  IV q12h Continue Cefepime 2gm IV q8h Monitor clinical progress, cultures/sensitivities, renal function, abx plan Vancomycin trough as indicated   Height: 6\' 2"  (188 cm) Weight: 132 kg (291 lb 0.1 oz) IBW/kg (Calculated) : 82.2  Temp (24hrs), Avg:97.6 F (36.4 C), Min:97 F (36.1 C), Max:98.2 F (36.8 C) Estimated Creatinine Clearance: 113.7 mL/min (by C-G formula based on SCr of 1.06 mg/dL).   No Known Allergies  Antimicrobials this admission: Vancomycin 8/22>>  Cefepime 8/22>>   Dose adjustments this admission: prn  Microbiology results: 8/22 [place]; NGTD 8/22 UCX: TBC 8/23: Wound: staph aureus 8/23: Bone: staph aureus   Thank you for allowing [place] to participate in this patients care.   Jens Som, PharmD Please see amion for complete clinical pharmacist phone list. 06/25/2020 3:39 PM

## 2020-06-25 NOTE — Progress Notes (Signed)
Progress Note    Eugene Diaz  WKM:[id number] DOB: 1965-11-08  DOA: 06/21/2020 PCP: [name], PA-C    Brief Narrative:     Medical records reviewed and are as summarized below:  Eugene Diaz is an 54 y.o. male with medical history significant for left foot osteomyelitis and amputation, diabetes mellitus, hypertension, alcoholism.  Patient presented to the ED with complaints of increasing left leg redness, swelling, pain and yellowish drainage ongoing for several days to weeks.  Reports he has been treated with courses of antibiotics prescribed by his podiatrist.  Was initially taken to the OR by podiatry but found to have such significant osteomyelitis that BKA was recommended.    Assessment/Plan:   Principal Problem:   Diabetic infection of left foot ([place]) Active Problems:   ETOH abuse   Alcoholic peripheral neuropathy (HCC)   Diabetic neuropathy (HCC)   Sepsis ([place])   Subacute osteomyelitis, left ankle and foot ([place])   Depression with anxiety   Sepsis due to osteomyelitis  -patient met sepsis criteria on admission with fever, tachycardia, source.   - broad-spectrum antibiotics with Vanco, metronidazole, cefepime.  -MRI of the left fibula, which shows acute osteomyelitis in the left ankle and hindfoot, as well as abscess in the soft tissue and septic arthritis.  - Podiatry consulted, patient went to the OR on 8/23 for I&D but given the extent of the infection podiatry recommended surgical consult for BKA. -Dr. 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    Family Communication/Anticipated D/C date and plan/Code Status   DVT prophylaxis: Lovenox Code Status: Full Code.  Family Communication: patient  Disposition Plan: Status is: Inpatient  Remains inpatient appropriate because:Inpatient level of care appropriate due to severity of illness   Dispo: The patient is from: Home              Anticipated d/c is to: tbd              Anticipated d/c date is: 2 days              Patient currently is not medically stable to d/c.         Medical Consultants:    Podiatry  ortho     Subjective:   Asked me to look at a "hang nail" on his left foot Asking for a breathing treatment  Objective:    Vitals:   06/24/20 2014 06/25/20 0326 06/25/20 0900 06/25/20 0951  BP: 120/75 127/79 125/77 (!) 141/75  Pulse: (!) 57 61 66 94  Resp: 17 15 16 18   Temp: (!) 97.4 F (36.3 C) 97.9 F (36.6 C) 98 F (36.7 C) 98.2 F (36.8 C)  TempSrc: Oral Oral Oral Oral  SpO2: 100% 99% 98% 100%  Weight:      Height:  Intake/Output Summary (Last 24 hours) at 06/25/2020 1053 Last data filed at 06/25/2020 1000 Gross per 24 hour  Intake 5341.11 ml  Output 1650 ml  Net 3691.11 ml   Filed Weights   06/21/20 2349 06/22/20 1855 06/24/20 1702  Weight: 132 kg 132 kg 132 kg    Exam:   General: Appearance:    Obese male in no acute distress     Lungs:     Mild wheezing, respirations unlabored  Heart:    Normal heart rate. Normal rhythm. No murmurs, rubs, or gallops.   MS:   Left foot amputated  Neurologic:   Awake, alert, oriented x 3. No apparent focal neurological           defect.    Data Reviewed:   I have personally reviewed following labs and  imaging studies:  Labs: Labs show the following:   Basic Metabolic Panel: Recent Labs  Lab 06/21/20 1414 06/21/20 1414 06/23/20 0539 06/23/20 0539 06/24/20 0530 06/25/20 0337  NA 133*  --  134*  --  129* 134*  K 4.3   < > 5.2*   < > 4.8 4.4  CL 102  --  103  --  99 103  CO2 22  --  24  --  21* 23  GLUCOSE 280*  --  285*  --  369* 245*  BUN 16  --  11  --  25* 21*  CREATININE 1.24  --  1.07  --  1.14 1.06  CALCIUM 8.8*  --  8.6*  --  8.4* 8.4*  MG 1.9  --  1.7  --  1.9  --   PHOS 2.7  --  4.3  --   --   --    < > = values in this interval not displayed.   GFR Estimated Creatinine Clearance: 113.7 mL/min (by C-G formula based on SCr of 1.06 mg/dL). Liver Function Tests: Recent Labs  Lab 06/21/20 1414 06/23/20 0539 06/24/20 0530  AST 19 11* 9*  ALT 9 9 10   ALKPHOS 92 65 61  BILITOT 0.5 0.2* <0.1*  PROT 8.5* 6.6 7.1  ALBUMIN 2.9* 1.9* 2.0*   No results for input(s): LIPASE, AMYLASE in the last 168 hours. No results for input(s): AMMONIA in the last 168 hours. Coagulation profile Recent Labs  Lab 06/21/20 1414  INR 1.1    CBC: Recent Labs  Lab 06/21/20 1414 06/23/20 0539 06/24/20 0530 06/25/20 0337  WBC 8.8 6.9 6.8 5.0  NEUTROABS 7.1  --   --   --   HGB 8.5* 6.6* 7.3* 7.7*  HCT 27.8* 21.8* 23.5* 25.2*  MCV 86.9 85.2 86.4 86.3  PLT 374 264 294 266   Cardiac Enzymes: No results for input(s): CKTOTAL, CKMB, CKMBINDEX, TROPONINI in the last 168 hours. BNP (last 3 results) No results for input(s): PROBNP in the last 8760 hours. CBG: Recent Labs  Lab 06/24/20 1122 06/24/20 1549 06/24/20 1919 06/24/20 2016 06/25/20 0642  GLUCAP 325* 280* 235* 210* 225*   D-Dimer: No results for input(s): DDIMER in the last 72 hours. Hgb A1c: No results for input(s): HGBA1C in the last 72 hours. Lipid Profile: No results for input(s): CHOL, HDL, LDLCALC, TRIG, CHOLHDL, LDLDIRECT in the last 72 hours. Thyroid function studies: No results for input(s): TSH,  T4TOTAL, T3FREE, THYROIDAB in the last 72 hours.  Invalid input(s): FREET3 Anemia work up: No results for input(s): VITAMINB12, FOLATE, FERRITIN, TIBC, IRON, RETICCTPCT in the last 72 hours. Sepsis Labs: Recent Labs  Lab 06/21/20 1414 06/21/20 1559 06/23/20 0539 06/24/20 0530 06/25/20 0337  WBC 8.8  --  6.9 6.8 5.0  LATICACIDVEN 2.2* 1.8  --   --   --     Microbiology Recent Results (from the past 240 hour(s))  SARS Coronavirus 2 by RT PCR (hospital order, performed in [facility] hospital lab) Nasopharyngeal Nasopharyngeal Swab     Status: None   Collection Time: 06/21/20  2:09 PM   Specimen: Nasopharyngeal Swab  Result Value Ref Range Status   SARS Coronavirus 2 NEGATIVE NEGATIVE Final    Comment: (NOTE) SARS-CoV-2 target nucleic acids are NOT DETECTED.  The SARS-CoV-2 RNA is generally detectable in upper and lower respiratory specimens during the acute phase of infection. The lowest concentration of SARS-CoV-2 viral copies this assay can detect is 250 copies / mL. A negative result does not preclude SARS-CoV-2 infection and should not be used as the sole basis for treatment or other patient management decisions.  A negative result may occur with improper specimen collection / handling, submission of specimen other than nasopharyngeal swab, presence of viral mutation(s) within the areas targeted by this assay, and inadequate number of viral copies (<250 copies / mL). A negative result must be combined with clinical observations, patient history, and epidemiological information.  Fact Sheet for Patients:   StrictlyIdeas.no  Fact Sheet for Healthcare Providers: BankingDealers.co.za  This test is not yet approved or  cleared by the [place] FDA and has been authorized for detection and/or diagnosis of SARS-CoV-2 by FDA under an Emergency Use Authorization (EUA).  This EUA will remain in effect (meaning this test can be  used) for the duration of the COVID-19 declaration under Section 564(b)(1) of the Act, 21 U.S.C. section 360bbb-3(b)(1), unless the authorization is terminated or revoked sooner.  Performed at [facility], [address], [place], [place] [address]   Blood Culture (routine x 2)     Status: None (Preliminary result)   Collection Time: 06/21/20  2:16 PM   Specimen: Right Antecubital; Blood  Result Value Ref Range Status   Specimen Description   Final    RIGHT ANTECUBITAL BOTTLES DRAWN AEROBIC AND ANAEROBIC   Special Requests Blood Culture adequate volume  Final   Culture   Final    NO GROWTH 4 DAYS Performed at [facility], [address], [place], [place] [address]    Report Status PENDING  Incomplete  Blood Culture (routine x 2)     Status: None (Preliminary result)   Collection Time: 06/21/20  2:16 PM   Specimen: Left Antecubital; Blood  Result Value Ref Range Status   Specimen Description   Final    LEFT ANTECUBITAL BOTTLES DRAWN AEROBIC AND ANAEROBIC   Special Requests Blood Culture adequate volume  Final   Culture   Final    NO GROWTH 4 DAYS Performed at [facility], [address], [place], [place] [address]    Report Status PENDING  Incomplete  Urine culture     Status: Abnormal   Collection Time: 06/21/20  5:12 PM   Specimen: Urine, Catheterized  Result Value Ref Range Status   Specimen Description   Final    URINE, CATHETERIZED Performed at [facility], [address], [place], [place] [address]    Special Requests   Final    NONE Performed at [facility], [address], [place], [place] [address]    Culture (A)  Final    <10,000 COLONIES/mL INSIGNIFICANT GROWTH Performed at [facility],  [address] [place], [place] [address]    Report Status 06/22/2020 FINAL  Final  Surgical pcr screen     Status: Abnormal   Collection Time: 06/22/20  2:10 AM   Specimen: Nasal Mucosa; Nasal Swab  Result Value Ref Range Status   MRSA, PCR NEGATIVE NEGATIVE Final    Staphylococcus aureus POSITIVE (A) NEGATIVE Final    Comment: (NOTE) The Xpert SA Assay (FDA approved for NASAL specimens in patients 32 years of age and older), is one component of a comprehensive surveillance program. It is not intended to diagnose infection nor to guide or monitor treatment. Performed at [facility], [place] [address], [place], [place] [address]   Aerobic/Anaerobic Culture (surgical/deep wound)     Status: None (Preliminary result)   Collection Time: 06/22/20  7:44 PM   Specimen: Foot, Left; Wound  Result Value Ref Range Status   Specimen Description WOUND LEFT FOOT  Final   Special Requests NONE  Final   Gram Stain   Final    RARE WBC PRESENT, PREDOMINANTLY PMN RARE GRAM POSITIVE COCCI Performed at [facility], [place] [address] [place], [place] [address]    Culture   Final    FEW GRAM POSITIVE COCCI IDENTIFICATION TO FOLLOW NO ANAEROBES ISOLATED; CULTURE IN PROGRESS FOR 5 DAYS    Report Status PENDING  Incomplete  Fungus Culture With Stain     Status: None (Preliminary result)   Collection Time: 06/22/20  8:04 PM   Specimen: Bone; Tissue  Result Value Ref Range Status   Fungus Stain Final report  Final    Comment: (NOTE) Performed At: [facility] [place], [place] [id number] Rush Farmer MD EK:[id number]    Fungus (Mycology) Culture PENDING  Incomplete   Fungal Source BONE  Final    Comment: TALUS Performed at [facility], [place] [address], [place], [place] [address]   Aerobic/Anaerobic Culture (surgical/deep wound)     Status: None (Preliminary result)   Collection Time: 06/22/20  8:04 PM   Specimen: Bone; Tissue  Result Value Ref Range Status   Specimen Description BONE  Final   Special Requests TALUS  Final   Gram Stain   Final    NO WBC SEEN NO ORGANISMS SEEN Performed at [facility], [address], [place], [place] [address]    Culture   Final    GRAM POSITIVE COCCI CRITICAL RESULT  CALLED TO, READ BACK BY AND VERIFIED WITH: RN G.ANIRA AT 1655 ON 06/23/20 BY T.SAAD NO ANAEROBES ISOLATED; CULTURE IN PROGRESS FOR 5 DAYS    Report Status PENDING  Incomplete  Fungus Culture Result     Status: None   Collection Time: 06/22/20  8:04 PM  Result Value Ref Range Status   Result 1 Comment  Final    Comment: (NOTE) KOH/Calcofluor preparation:  no fungus observed. Performed At: [facility] [place], [place] [id number] Rush Farmer MD ML:[id number]     Procedures and diagnostic studies:  No results found.  Medications:   . amitriptyline  100 mg Oral QHS  . atorvastatin  40 mg Oral QPM  . Chlorhexidine Gluconate Cloth  6 each Topical Q0600  . docusate sodium  100 mg Oral BID  . DULoxetine  60 mg Oral Daily  . enoxaparin (LOVENOX) injection  65 mg Subcutaneous Q24H  . folic acid  1 mg Oral Daily  . furosemide  20 mg Oral Daily  . gabapentin  1,200 mg Oral QHS  . gabapentin  900 mg Oral BID  . glycopyrrolate  1 mg Oral BID  . insulin aspart  0-15 Units Subcutaneous TID WC  . insulin aspart  0-5 Units Subcutaneous QHS  . insulin aspart  10 Units Subcutaneous NOW  . lisinopril  20 mg Oral QHS  . morphine  30 mg Oral Q12H  . multivitamin with minerals  1 tablet Oral Daily  . mupirocin ointment  1 application Nasal BID  . pantoprazole  40 mg Oral Daily  . primidone  50 mg Oral BID  . thiamine  100 mg Oral Daily   Or  . thiamine  100 mg Intravenous Daily   Continuous Infusions: . sodium chloride 75 mL/hr at 06/24/20 2211  . ceFEPime (MAXIPIME) IV 2 g (06/25/20 0829)  . metronidazole 500 mg (06/25/20 0553)  . vancomycin Stopped (06/25/20 0352)     LOS: 4 days   Geradine Girt  [facility]   How to contact the [facility] Attending or Consulting provider [place] or covering provider during after hours [place], for this patient?  1. Check the care team in [facility] and look for a) attending/consulting TRH provider listed and b) the [facility] team  listed 2. Log into [email or web address] and use [place]'s universal password to access. If you do not have the password, please contact the hospital operator. 3. Locate the [facility] provider you are looking for under [facility] and page to a number that you can be directly reached. 4. If you still have difficulty reaching the provider, please page the [facility] (Director on Call) for the Hospitalists listed on amion for assistance.  06/25/2020, 10:53 AM

## 2020-06-25 NOTE — TOC Initial Note (Signed)
Transition of Care [facility]) - Initial/Assessment Note    Patient Details  Name: Eugene Diaz MRN: [id number] Date of Birth: [birth year]  Transition of Care [facility]) CM/SW Contact:    Curlene Labrum, RN Phone Number: 06/25/2020, 2:55 PM  Clinical Narrative:                 Case management met with the patient and mother in the room S/P Left BKA on 8/25.  The patient was evaluated by OT - and waiting on Pt/OT notes posted for probable CIr placement if patient qualifies.  The patient lives at home alone but has family supports through his mother and 3 siblings.  The patient has not had a COVID vaccine and declined receiving at the moment and states that he might consider and would reach out to nursing if he changes his mind on the vaccine.  Will continue to follow for discharge needs - pending CIR admission.  Expected Discharge Plan: IP Rehab Facility Barriers to Discharge: Continued Medical Work up   Patient Goals and CMS Choice Patient states their goals for this hospitalization and ongoing recovery are:: Patient would like to discharge to CIR. CMS Medicare.gov Compare Post Acute Care list provided to:: Patient Choice offered to / list presented to : Patient  Expected Discharge Plan and Services Expected Discharge Plan: [place]   Discharge Planning Services: CM Consult   Living arrangements for the past 2 months: Single Family Home                                      Prior Living Arrangements/Services Living arrangements for the past 2 months: Single Family Home Lives with:: Self Patient language and need for interpreter reviewed:: Yes Do you feel safe going back to the place where you live?: Yes      Need for Family Participation in Patient Care: Yes (Comment) Care giver support system in place?: Yes (comment) Current home services: DME (Patient currently has crutches, walker, and shower seat) Criminal Activity/Legal Involvement Pertinent to Current  Situation/Hospitalization: No - Comment as needed  Activities of Daily Living   ADL Screening (condition at time of admission) Patient's cognitive ability adequate to safely complete daily activities?: Yes Is the patient deaf or have difficulty hearing?: No Does the patient have difficulty seeing, even when wearing glasses/contacts?: No Does the patient have difficulty concentrating, remembering, or making decisions?: No Patient able to express need for assistance with ADLs?: Yes Does the patient have difficulty dressing or bathing?: Yes Independently performs ADLs?: Yes (appropriate for developmental age) Does the patient have difficulty walking or climbing stairs?: Yes Weakness of Legs: Both Weakness of Arms/Hands: Both  Permission Sought/Granted Permission sought to share information with : Case Manager Permission granted to share information with : Yes, Verbal Permission Granted     Permission granted to share info w AGENCY: CIR  Permission granted to share info w Relationship: family contacts     Emotional Assessment Appearance:: Appears stated age Attitude/Demeanor/Rapport: Gracious Affect (typically observed): Accepting Orientation: : Oriented to Self, Oriented to Place, Oriented to  Time, Oriented to Situation Alcohol / Substance Use: Alcohol Use (drinks 6 beers per day) Psych Involvement: No (comment)  Admission diagnosis:  Diabetic foot infection ([place]) [X38.182, L08.9] Swelling of lower extremity [M79.89] Sepsis without acute organ dysfunction, due to unspecified organism [facility]) [A41.9] Patient Active Problem List   Diagnosis Date Noted  .  Severe protein-calorie malnutrition ([place])   . Acute blood loss anemia 01/04/2020  . Asthma   . Depression with anxiety   . Osteomyelitis of ankle or foot, acute, left ([place]) 12/17/2019  . Hyperglycemia 12/17/2019  . Type 2 diabetes mellitus with foot ulcer ([place])   . Anemia, chronic disease 11/10/2019  . Acute osteomyelitis of  left foot ([place]) 11/10/2019  . Marijuana use 11/10/2019  . Wound dehiscence 08/31/2019  . Osteomyelitis due to type 2 diabetes mellitus ([place]) 08/14/2019  . Acute osteomyelitis of toe of left foot ([place]) 08/14/2019  . Sepsis ([place]) 08/14/2019  . Hyponatremia 08/14/2019  . Normocytic anemia 08/14/2019  . B12 deficiency 08/14/2019  . Subacute osteomyelitis, left ankle and foot ([place])   . Cellulitis of toe, left   . Diabetic infection of left foot ([place])   . Toe fracture, left 07/13/2019  . Cellulitis 07/12/2019  . GERD (gastroesophageal reflux disease) 12/19/2018  . Hepatic steatosis 06/18/2018  . Gastritis, erosive   . Dysphagia, idiopathic 12/27/2017  . Rectal bleeding 12/27/2017  . Intention tremor 06/03/2016  . Left knee pain 12/04/2015  . Alcoholic peripheral neuropathy ([place]) 10/08/2014  . Diabetic neuropathy ([place]) 10/08/2014  . Chest pain, rule out acute myocardial infarction 03/10/2014  . Thrombocytopenia ([place]) 03/10/2014  . ETOH abuse 03/10/2014  . Chest pain 03/10/2014  . Diabetes mellitus without complication ([place])   . Hypertension associated with type 2 diabetes mellitus ([place])   . Hyperlipidemia associated with type 2 diabetes mellitus ([place])    PCP:  [name], PA-C Pharmacy:   [facility], [place] [address] [place] [address] Phone: [phone] Fax: [phone]     Social Determinants of Health (SDOH) Interventions    Readmission Risk Interventions Readmission Risk Prevention Plan 06/25/2020  Transportation Screening Complete  Medication Review ([place]) Complete  PCP or Specialist appointment within 3-5 days of discharge Complete  HRI or Home Care Consult Complete  SW Recovery Care/Counseling Consult Complete  Palliative Care Screening Complete  Skilled Nursing Facility Complete  Some recent data might be hidden

## 2020-06-25 NOTE — Plan of Care (Signed)

## 2020-06-25 NOTE — Progress Notes (Addendum)
Patient is postop day 1 status post left below-knee amputation.  He is doing well and sitting up in bed.  VAC is in place and functioning.  0 cc in canister.   From an orthopedic standpoint patient could be discharged when medically stable.  Patient will need 3 days of postoperative antibiotics

## 2020-06-26 LAB — CULTURE, BLOOD (ROUTINE X 2)
Culture: NO GROWTH
Culture: NO GROWTH
Special Requests: ADEQUATE
Special Requests: ADEQUATE

## 2020-06-26 LAB — GLUCOSE, CAPILLARY
Glucose-Capillary: 299 mg/dL — ABNORMAL HIGH (ref 70–99)
Glucose-Capillary: 195 mg/dL — ABNORMAL HIGH (ref 70–99)
Glucose-Capillary: 187 mg/dL — ABNORMAL HIGH (ref 70–99)
Glucose-Capillary: 171 mg/dL — ABNORMAL HIGH (ref 70–99)

## 2020-06-26 MED ORDER — INSULIN GLARGINE 100 UNIT/ML ~~LOC~~ SOLN
10.0000 [IU] | Freq: Every day | SUBCUTANEOUS | Status: DC
  Administered 2020-06-26 – 2020-06-30 (×5): 10 [IU] via SUBCUTANEOUS
  Filled 2020-06-26 (×5): qty 0.1

## 2020-06-26 NOTE — Plan of Care (Signed)
  Problem: Education: Goal: Knowledge of General Education information will improve Description: Including pain rating scale, medication(s)/side effects and non-pharmacologic comfort measures Outcome: Progressing   Problem: Education: Goal: Knowledge of General Education information will improve Description: Including pain rating scale, medication(s)/side effects and non-pharmacologic comfort measures Outcome: Progressing   Problem: Education: Goal: Knowledge of General Education information will improve Description: Including pain rating scale, medication(s)/side effects and non-pharmacologic comfort measures Outcome: Progressing   

## 2020-06-26 NOTE — Progress Notes (Signed)
Progress Note    Eugene Diaz  SEG:[id number] DOB: [birth year]  DOA: 06/21/2020 PCP: [name], PA-C    Brief Narrative:     Medical records reviewed and are as summarized below:  Eugene Diaz is an 54 y.o. male with medical history significant for left foot osteomyelitis and amputation, diabetes mellitus, hypertension, alcoholism.  Patient presented to the ED with complaints of increasing left leg redness, swelling, pain and yellowish drainage ongoing for several days to weeks.  Reports he has been treated with courses of antibiotics prescribed by his podiatrist.  Was initially taken to the OR by podiatry but found to have such significant osteomyelitis that BKA was recommended.    Assessment/Plan:   Principal Problem:   Diabetic infection of left foot ([place]) Active Problems:   ETOH abuse   Alcoholic peripheral neuropathy (HCC)   Diabetic neuropathy (HCC)   Sepsis ([place])   Subacute osteomyelitis, left ankle and foot ([place])   Depression with anxiety   Sepsis due to osteomyelitis  -patient met sepsis criteria on admission with fever, tachycardia, source.   - broad-spectrum antibiotics with Vanco, metronidazole, cefepime through 8/28 -MRI of the left fibula, which shows acute osteomyelitis in the left ankle and hindfoot, as well as abscess in the soft tissue and septic arthritis.  - Podiatry consulted, patient went to the OR on 8/23 for I&D but given the extent of the infection podiatry recommended surgical consult for BKA. -Dr. 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    Family Communication/Anticipated D/C date and plan/Code Status   DVT prophylaxis: Lovenox Code Status: Full Code.  Family Communication: patient  Disposition Plan: Status is: Inpatient  Remains inpatient appropriate because:Inpatient level of care appropriate due to severity of illness   Dispo: The patient is from: Home              Anticipated d/c is to: tbd-- CIR              Anticipated d/c date is: when safe d/c arranged              Patient currently is not medically stable to d/c.- needs IV Abx through 8/28         Medical Consultants:    Podiatry  ortho     Subjective:   No current complaints  Objective:    Vitals:   06/25/20 1458 06/25/20 1900 06/26/20 0417 06/26/20 0801  BP: (!) 163/94  (!) 166/82 (!) 170/82  Pulse: 88 93 83 79  Resp: 17  17 18   Temp: 98.2 F (36.8 C) 98.2 F (36.8 C) 98.9 F (37.2 C) 98.7 F (37.1 C)  TempSrc: Oral Oral Oral Oral  SpO2: 94% 99% 97% 97%  Weight:      Height:        Intake/Output Summary (Last 24  hours) at 06/26/2020 0945 Last data filed at 06/26/2020 0500 Gross per 24 hour  Intake 840 ml  Output 2900 ml  Net -2060 ml   Filed Weights   06/21/20 2349 06/22/20 1855 06/24/20 1702  Weight: 132 kg 132 kg 132 kg    Exam:   General: Appearance:    Obese male in no acute distress     Lungs:     No wheezing, respirations unlabored  Heart:    Normal heart rate. Normal rhythm. No murmurs, rubs, or gallops.   MS:   Left leg amputation below knee  Neurologic:   Awake, alert, oriented x 3. No apparent focal neurological           defect.    Data Reviewed:   I have personally reviewed following labs and imaging  studies:  Labs: Labs show the following:   Basic Metabolic Panel: Recent Labs  Lab 06/21/20 1414 06/21/20 1414 06/23/20 0539 06/23/20 0539 06/24/20 0530 06/25/20 0337  NA 133*  --  134*  --  129* 134*  K 4.3   < > 5.2*   < > 4.8 4.4  CL 102  --  103  --  99 103  CO2 22  --  24  --  21* 23  GLUCOSE 280*  --  285*  --  369* 245*  BUN 16  --  11  --  25* 21*  CREATININE 1.24  --  1.07  --  1.14 1.06  CALCIUM 8.8*  --  8.6*  --  8.4* 8.4*  MG 1.9  --  1.7  --  1.9  --   PHOS 2.7  --  4.3  --   --   --    < > = values in this interval not displayed.   GFR Estimated Creatinine Clearance: 113.7 mL/min (by C-G formula based on SCr of 1.06 mg/dL). Liver Function Tests: Recent Labs  Lab 06/21/20 1414 06/23/20 0539 06/24/20 0530  AST 19 11* 9*  ALT 9 9 10   ALKPHOS 92 65 61  BILITOT 0.5 0.2* <0.1*  PROT 8.5* 6.6 7.1  ALBUMIN 2.9* 1.9* 2.0*   No results for input(s): LIPASE, AMYLASE in the last 168 hours. No results for input(s): AMMONIA in the last 168 hours. Coagulation profile Recent Labs  Lab 06/21/20 1414  INR 1.1    CBC: Recent Labs  Lab 06/21/20 1414 06/23/20 0539 06/24/20 0530 06/25/20 0337  WBC 8.8 6.9 6.8 5.0  NEUTROABS 7.1  --   --   --   HGB 8.5* 6.6* 7.3* 7.7*  HCT 27.8* 21.8* 23.5* 25.2*  MCV 86.9 85.2 86.4 86.3  PLT 374 264 294 266   Cardiac Enzymes: No results for input(s): CKTOTAL, CKMB, CKMBINDEX, TROPONINI in the last 168 hours. BNP (last 3 results) No results for input(s): PROBNP in the last 8760 hours. CBG: Recent Labs  Lab 06/25/20 0642 06/25/20 1131 06/25/20 1620 06/25/20 2005 06/26/20 0651  GLUCAP 225* 206* 234* 236* 299*   D-Dimer: No results for input(s): DDIMER in the last 72 hours. Hgb A1c: No results for input(s): HGBA1C in the last 72 hours. Lipid Profile: No results for input(s): CHOL, HDL, LDLCALC, TRIG, CHOLHDL, LDLDIRECT in the last 72 hours. Thyroid function studies: No results for input(s): TSH, T4TOTAL,  T3FREE, THYROIDAB in the last 72 hours.  Invalid input(s): FREET3 Anemia work up: No results for input(s): VITAMINB12, FOLATE, FERRITIN, TIBC, IRON, RETICCTPCT in the last 72 hours. Sepsis Labs: Recent Labs  Lab  06/21/20 1414 06/21/20 1559 06/23/20 0539 06/24/20 0530 06/25/20 0337  WBC 8.8  --  6.9 6.8 5.0  LATICACIDVEN 2.2* 1.8  --   --   --     Microbiology Recent Results (from the past 240 hour(s))  SARS Coronavirus 2 by RT PCR (hospital order, performed in [facility] hospital lab) Nasopharyngeal Nasopharyngeal Swab     Status: None   Collection Time: 06/21/20  2:09 PM   Specimen: Nasopharyngeal Swab  Result Value Ref Range Status   SARS Coronavirus 2 NEGATIVE NEGATIVE Final    Comment: (NOTE) SARS-CoV-2 target nucleic acids are NOT DETECTED.  The SARS-CoV-2 RNA is generally detectable in upper and lower respiratory specimens during the acute phase of infection. The lowest concentration of SARS-CoV-2 viral copies this assay can detect is 250 copies / mL. A negative result does not preclude SARS-CoV-2 infection and should not be used as the sole basis for treatment or other patient management decisions.  A negative result may occur with improper specimen collection / handling, submission of specimen other than nasopharyngeal swab, presence of viral mutation(s) within the areas targeted by this assay, and inadequate number of viral copies (<250 copies / mL). A negative result must be combined with clinical observations, patient history, and epidemiological information.  Fact Sheet for Patients:   StrictlyIdeas.no  Fact Sheet for Healthcare Providers: BankingDealers.co.za  This test is not yet approved or  cleared by the [place] FDA and has been authorized for detection and/or diagnosis of SARS-CoV-2 by FDA under an Emergency Use Authorization (EUA).  This EUA will remain in effect (meaning this test can be used) for  the duration of the COVID-19 declaration under Section 564(b)(1) of the Act, 21 U.S.C. section 360bbb-3(b)(1), unless the authorization is terminated or revoked sooner.  Performed at [facility], [address], [place], [place] [address]   Blood Culture (routine x 2)     Status: None   Collection Time: 06/21/20  2:16 PM   Specimen: Right Antecubital; Blood  Result Value Ref Range Status   Specimen Description   Final    RIGHT ANTECUBITAL BOTTLES DRAWN AEROBIC AND ANAEROBIC   Special Requests Blood Culture adequate volume  Final   Culture   Final    NO GROWTH 5 DAYS Performed at [facility], [address], [place], [place] [address]    Report Status 06/26/2020 FINAL  Final  Blood Culture (routine x 2)     Status: None   Collection Time: 06/21/20  2:16 PM   Specimen: Left Antecubital; Blood  Result Value Ref Range Status   Specimen Description   Final    LEFT ANTECUBITAL BOTTLES DRAWN AEROBIC AND ANAEROBIC   Special Requests Blood Culture adequate volume  Final   Culture   Final    NO GROWTH 5 DAYS Performed at [facility], [address], [place], [place] [address]    Report Status 06/26/2020 FINAL  Final  Urine culture     Status: Abnormal   Collection Time: 06/21/20  5:12 PM   Specimen: Urine, Catheterized  Result Value Ref Range Status   Specimen Description   Final    URINE, CATHETERIZED Performed at [facility], [address] [place], [place] [address]    Special Requests   Final    NONE Performed at [facility], [address], [place], [place] [address]    Culture (A)  Final    <10,000 COLONIES/mL INSIGNIFICANT GROWTH Performed at [facility], [place]  [address] [place], [place] [address]    Report Status 06/22/2020 FINAL  Final  Surgical pcr screen     Status: Abnormal   Collection Time: 06/22/20  2:10 AM   Specimen: Nasal Mucosa; Nasal Swab  Result Value Ref Range Status   MRSA, PCR NEGATIVE NEGATIVE Final   Staphylococcus aureus POSITIVE (A)  NEGATIVE Final    Comment: (NOTE) The Xpert SA Assay (FDA approved for NASAL specimens in patients 73 years of age and older), is one component of a comprehensive surveillance program. It is not intended to diagnose infection nor to guide or monitor treatment. Performed at [facility], [place] [address], [place], [place] [address]   Aerobic/Anaerobic Culture (surgical/deep wound)     Status: None (Preliminary result)   Collection Time: 06/22/20  7:44 PM   Specimen: Foot, Left; Wound  Result Value Ref Range Status   Specimen Description WOUND LEFT FOOT  Final   Special Requests NONE  Final   Gram Stain   Final    RARE WBC PRESENT, PREDOMINANTLY PMN RARE GRAM POSITIVE COCCI Performed at [facility], [place] [address] [place], [place] [address]    Culture   Final    FEW STAPHYLOCOCCUS AUREUS SUSCEPTIBILITIES TO FOLLOW NO ANAEROBES ISOLATED; CULTURE IN PROGRESS FOR 5 DAYS    Report Status PENDING  Incomplete  Fungus Culture With Stain     Status: None (Preliminary result)   Collection Time: 06/22/20  8:04 PM   Specimen: Bone; Tissue  Result Value Ref Range Status   Fungus Stain Final report  Final    Comment: (NOTE) Performed At: [facility] [place], [place] [id number] Rush Farmer MD TD:[id number]    Fungus (Mycology) Culture PENDING  Incomplete   Fungal Source BONE  Final    Comment: TALUS Performed at [facility], [place] [address] [place], [place] [address]   Aerobic/Anaerobic Culture (surgical/deep wound)     Status: None (Preliminary result)   Collection Time: 06/22/20  8:04 PM   Specimen: Bone; Tissue  Result Value Ref Range Status   Specimen Description BONE  Final   Special Requests TALUS  Final   Gram Stain   Final    NO WBC SEEN NO ORGANISMS SEEN Performed at [facility], [address] [place], [place] [address]    Culture   Final    RARE STAPHYLOCOCCUS AUREUS CRITICAL RESULT CALLED TO, READ BACK BY AND  VERIFIED WITH: RN G.ANIRA AT 1638 ON 06/23/20 BY T.SAAD NO ANAEROBES ISOLATED; CULTURE IN PROGRESS FOR 5 DAYS    Report Status PENDING  Incomplete   Organism ID, Bacteria STAPHYLOCOCCUS AUREUS  Final      Susceptibility   Staphylococcus aureus - MIC*    CIPROFLOXACIN <=0.5 SENSITIVE Sensitive     ERYTHROMYCIN >=8 RESISTANT Resistant     GENTAMICIN <=0.5 SENSITIVE Sensitive     OXACILLIN 0.5 SENSITIVE Sensitive     TETRACYCLINE >=16 RESISTANT Resistant     VANCOMYCIN <=0.5 SENSITIVE Sensitive     TRIMETH/SULFA <=10 SENSITIVE Sensitive     CLINDAMYCIN >=8 RESISTANT Resistant     RIFAMPIN <=0.5 SENSITIVE Sensitive     Inducible Clindamycin NEGATIVE Sensitive     * RARE STAPHYLOCOCCUS AUREUS  Fungus Culture Result     Status: None   Collection Time: 06/22/20  8:04 PM  Result Value Ref Range Status   Result 1 Comment  Final    Comment: (NOTE) KOH/Calcofluor preparation:  no fungus observed.  Performed At: [facility] [place], [place] [id number] Rush Farmer MD DO:[id number]     Procedures and diagnostic studies:  No results found.  Medications:   . amitriptyline  100 mg Oral QHS  . atorvastatin  40 mg Oral QPM  . Chlorhexidine Gluconate Cloth  6 each Topical Q0600  . docusate sodium  100 mg Oral BID  . DULoxetine  60 mg Oral Daily  . enoxaparin (LOVENOX) injection  65 mg Subcutaneous Q24H  . folic acid  1 mg Oral Daily  . furosemide  20 mg Oral Daily  . gabapentin  1,200 mg Oral QHS  . gabapentin  900 mg Oral BID  . glycopyrrolate  1 mg Oral BID  . insulin aspart  0-15 Units Subcutaneous TID WC  . insulin aspart  0-5 Units Subcutaneous QHS  . lisinopril  20 mg Oral QHS  . morphine  30 mg Oral Q12H  . multivitamin with minerals  1 tablet Oral Daily  . mupirocin ointment  1 application Nasal BID  . pantoprazole  40 mg Oral Daily  . primidone  50 mg Oral BID  . thiamine  100 mg Oral Daily   Or  . thiamine  100 mg Intravenous Daily    Continuous Infusions: . sodium chloride 75 mL/hr at 06/24/20 2211  . ceFEPime (MAXIPIME) IV 2 g (06/26/20 0547)  . metronidazole 500 mg (06/26/20 9037)  . vancomycin 1,000 mg (06/26/20 0420)     LOS: 5 days   Geradine Girt  [facility]   How to contact the [facility] Attending or Consulting provider [place] or covering provider during after hours [place], for this patient?  1. Check the care team in [facility] and look for a) attending/consulting TRH provider listed and b) the [facility] team listed 2. Log into [email or web address] and use [place] universal password to access. If you do not have the password, please contact the hospital operator. 3. Locate the [facility] provider you are looking for under [facility] and page to a number that you can be directly reached. 4. If you still have difficulty reaching the provider, please page the [facility] (Director on Call) for the Hospitalists listed on amion for assistance.  06/26/2020, 9:45 AM

## 2020-06-26 NOTE — Progress Notes (Signed)
Physical Therapy Treatment Patient Details Name: GEMAYEL MASCIO MRN: [id number] DOB: 10-Apr-1966 Today's Date: 06/26/2020    History of Present Illness OCTAVIS SHEELER is an 54 y.o. male with medical history significant for left foot osteomyelitis and amputation, diabetes mellitus, hypertension, alcoholism.  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    PT Comments    Pt progressed with less assistance needed with mobility today. Appears pt would still benefit from CIR stay to maximize mobility prior to discharge home. Acute PT to continue during pt's hospital stay.    Follow Up Recommendations  CIR     Equipment Recommendations  Wheelchair cushion (measurements PT);Wheelchair (measurements PT)    Recommendations for Other Services Rehab consult     Precautions / Restrictions Precautions Precautions: Fall Required Braces or Orthoses: Other Brace Other Brace: Limb protector Restrictions LLE Weight Bearing: Non weight bearing    Mobility  Bed Mobility Overal bed mobility: Needs Assistance Bed Mobility: Supine to Sit     Supine to sit: Min guard     General bed mobility comments: pt brought self from long sitting in bed to short sitting at edge of bed with HOB 45-50 degrees and rail used. PTA mangaged lines/tubes. pt uses hand to advance limb in limb guard to edge of bed  Transfers Overall transfer level: Needs assistance Equipment used: Rolling walker (2 wheeled) Transfers: Sit to/from [facility] Sit to Stand: Mod assist;From elevated surface Stand pivot transfers: Min assist       General transfer comment: pt needed mod assist to stand from an elevated bed to RW with cues for weight shifting and hand placement. once standing pt needed min assist for balance to perform 3 "hop" steps from bed to chair. min assist for controlled descent to sit into recliner.      Cognition Arousal/Alertness:  Awake/alert Behavior During Therapy: WFL for tasks assessed/performed Overall Cognitive Status: Within Functional Limits for tasks assessed             General Comments: pt following multistep commands with no delay.      Exercises General Exercises - Lower Extremity Quad Sets: Strengthening;Left;5 reps;Seated;AROM;Limitations Quad Sets Limitations: wtih footrest up, limb guard on Hip ABduction/ADduction: AAROM;Strengthening;Left;Both;Seated;Limitations Hip Abduction/Adduction Limitations: with footrest up, limb guard on Straight Leg Raises: AAROM;Strengthening;Left;5 reps;Seated;Limitations Straight Leg Raises Limitations: with footrest up, limb gaurd on     Pertinent Vitals/Pain Pain Assessment: 0-10 Pain Score: 6  Pain Location: L LE Pain Descriptors / Indicators: Tightness;Sharp;Spasm Pain Intervention(s): Limited activity within patient's tolerance;Monitored during session;Premedicated before session     PT Goals (current goals can now be found in the care plan section) Acute Rehab PT Goals Patient Stated Goal: agreeable to rehab in hospital PT Goal Formulation: With patient Time For Goal Achievement: 07/09/20 Potential to Achieve Goals: Good Additional Goals Additional Goal #1: Patient to propel w/c x 150' with S Progress towards PT goals: Progressing toward goals    Frequency    Min 4X/week       AM-PAC PT "6 Clicks" Mobility   Outcome Measure  Help needed turning from your back to your side while in a flat bed without using bedrails?: A Little Help needed moving from lying on your back to sitting on the side of a flat bed without using bedrails?: A Little Help needed moving to and from a bed to a chair (including a wheelchair)?: A Lot Help needed standing up from a chair  using your arms (e.g., wheelchair or bedside chair)?: A Lot   Help needed climbing 3-5 steps with a railing? : Total 6 Click Score: 11    End of Session Equipment Utilized During  Treatment: Gait belt Activity Tolerance: Patient tolerated treatment well Patient left: in chair;with call bell/phone within reach;with chair alarm set Nurse Communication: Mobility status PT Visit Diagnosis: Other abnormalities of gait and mobility (R26.89);Pain;Difficulty in walking, not elsewhere classified (R26.2) Pain - Right/Left: Left Pain - part of body: Leg     Time: 6256-3893 PT Time Calculation (min) (ACUTE ONLY): 26 min  Charges:  $Therapeutic Exercise: 8-22 mins $Therapeutic Activity: 8-22 mins                    Willow Ora, PTA, [facility] Acute [employer] Office- [phone] 06/26/20, 9:47 AM   Willow Ora 06/26/2020, 9:45 AM

## 2020-06-26 NOTE — Plan of Care (Signed)
  Problem: Pain Managment: Goal: General experience of comfort will improve Outcome: Progressing   

## 2020-06-26 NOTE — Progress Notes (Signed)
Patient is postop day #2 status post left below-knee amputation vital signs stable patient is comfortable lying in bed   Limb protector in place patient is getting to full extension.  0 cc in the canister.  From an orthopedic standpoint patient can be discharged today.  Will need 1 week follow-up in our office

## 2020-06-26 NOTE — TOC Progression Note (Signed)
Transition of Care [facility]) - Progression Note    Patient Details  Name: Eugene Diaz MRN: [id number] Date of Birth: 01-Sep-1966  [facility] of [facility]) CM/SW Contact  Curlene Labrum, RN Phone Number: 06/26/2020, 4:09 PM  Clinical Narrative:    Case management called and spoke with CIR and confirmed that patient was on the list to be seen for consults.   Expected Discharge Plan: IP Rehab Facility Barriers to Discharge: Continued Medical Work up  Expected Discharge Plan and Services Expected Discharge Plan: [place]   Discharge Planning Services: CM Consult   Living arrangements for the past 2 months: Single Family Home                                       Social Determinants of Health (SDOH) Interventions    Readmission Risk Interventions Readmission Risk Prevention Plan 06/25/2020  Transportation Screening Complete  Medication Review [employer]) Complete  PCP or Specialist appointment within 3-5 days of discharge Complete  HRI or Home Care Consult Complete  SW Recovery Care/Counseling Consult Complete  Palliative Care Screening Complete  Skilled Nursing Facility Complete  Some recent data might be hidden

## 2020-06-27 LAB — AEROBIC/ANAEROBIC CULTURE W GRAM STAIN (SURGICAL/DEEP WOUND): Gram Stain: NONE SEEN

## 2020-06-27 LAB — GLUCOSE, CAPILLARY
Glucose-Capillary: 191 mg/dL — ABNORMAL HIGH (ref 70–99)
Glucose-Capillary: 198 mg/dL — ABNORMAL HIGH (ref 70–99)
Glucose-Capillary: 229 mg/dL — ABNORMAL HIGH (ref 70–99)
Glucose-Capillary: 172 mg/dL — ABNORMAL HIGH (ref 70–99)
Glucose-Capillary: 212 mg/dL — ABNORMAL HIGH (ref 70–99)

## 2020-06-27 MED ORDER — LISINOPRIL 20 MG PO TABS
40.0000 mg | ORAL_TABLET | Freq: Every day | ORAL | Status: DC
  Administered 2020-06-27 – 2020-06-29 (×3): 40 mg via ORAL
  Filled 2020-06-27 (×3): qty 2

## 2020-06-27 MED ORDER — HYDRALAZINE HCL 25 MG PO TABS
25.0000 mg | ORAL_TABLET | Freq: Three times a day (TID) | ORAL | Status: DC
  Administered 2020-06-27 – 2020-06-30 (×10): 25 mg via ORAL
  Filled 2020-06-27 (×10): qty 1

## 2020-06-27 NOTE — Progress Notes (Signed)
Inpatient Rehab Admissions:  Inpatient Rehab Consult received.  I met with patient at the bedside for rehabilitation assessment and to discuss goals and expectations of an inpatient rehab admission.  Pt acknowledged understanding. Pt interested in CIR.  Pt gave permission to call sister, Mickel Baas. She also acknowledged understanding of CIR goals and expectations.  Will begin insurance authorization for potential admission to CIR.  Signed: Gayland Curry, [place], [place] Admissions Coordinator [phone]

## 2020-06-27 NOTE — Progress Notes (Signed)
Physical Therapy Treatment Patient Details Name: Eugene Diaz MRN: [id number] DOB: 1965-12-22 Today's Date: 06/27/2020    History of Present Illness Eugene Diaz is an 54 y.o. male with medical history significant for left foot osteomyelitis and amputation, diabetes mellitus, hypertension, alcoholism.  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    PT Comments    The pt is continuing to make good progress with therapy this session as he was able to initiate short bouts of ambulation with hop-to pattern using a RW and progress capacity for sit-stand transfers. The pt continues to demo poor functional strength in his RLE, requiring increased assist to stand from elevated surface and fatigue after ~15 ft requiring seated rest due to gradually reduced clearance with his RLE. The pt remains an excellent candidate for CIR to facilitate safety, stability, and return to prior level of independence.     Follow Up Recommendations  CIR     Equipment Recommendations  Wheelchair cushion (measurements PT);Wheelchair (measurements PT)    Recommendations for Other Services       Precautions / Restrictions Restrictions Weight Bearing Restrictions: Yes LLE Weight Bearing: Non weight bearing    Mobility  Bed Mobility Overal bed mobility: Needs Assistance Bed Mobility: Supine to Sit     Supine to sit: Supervision     General bed mobility comments: some management of lines (wound vac), but pt able to mobilize to EOB and adjust without assist  Transfers Overall transfer level: Needs assistance Equipment used: Rolling walker (2 wheeled) Transfers: Sit to/from [facility] Sit to Stand: Mod assist;From elevated surface;Min assist Stand pivot transfers: Min assist       General transfer comment: initially modA of 2, progressed to minA of 1 with reps (x5 through session) with VC for hand placement  Ambulation/Gait Ambulation/Gait  assistance: Min assist Gait Distance (Feet): 15 Feet (x 3) Assistive device: Rolling walker (2 wheeled)       General Gait Details: hop-to pattern with RW, pt hopefull to progress to crutches soon but not yet steady enough. progressively worsening clearance of RLE with fatigue. no LOB. HR to 115bpm, recovers to 80s within 1 min seated rest       Balance Overall balance assessment: Needs assistance Sitting-balance support: Bilateral upper extremity supported Sitting balance-Leahy Scale: Fair Sitting balance - Comments: sitting EOB with S   Standing balance support: Bilateral upper extremity supported Standing balance-Leahy Scale: Poor Standing balance comment: UE support in standing                            Cognition Arousal/Alertness: Awake/alert Behavior During Therapy: WFL for tasks assessed/performed Overall Cognitive Status: Within Functional Limits for tasks assessed                                 General Comments: pt following multistep commands with no delay.      Exercises      General Comments        Pertinent Vitals/Pain Pain Assessment: Faces Faces Pain Scale: Hurts a little bit Pain Location: L LE Pain Descriptors / Indicators: Tightness;Sharp;Spasm Pain Intervention(s): Limited activity within patient's tolerance;Monitored during session    Home Living   Living Arrangements: Alone Available Help at Discharge: Family   Home Access: Stairs to enter Entrance Stairs-Rails: None Home Layout: One level  Prior Function            PT Goals (current goals can now be found in the care plan section) Acute Rehab PT Goals Patient Stated Goal: agreeable to rehab in hospital PT Goal Formulation: With patient Time For Goal Achievement: 07/09/20 Potential to Achieve Goals: Good Additional Goals Additional Goal #1: Patient to propel w/c x 150' with S Progress towards PT goals: Progressing toward goals     Frequency    Min 4X/week      PT Plan Current plan remains appropriate       AM-PAC PT "6 Clicks" Mobility   Outcome Measure  Help needed turning from your back to your side while in a flat bed without using bedrails?: A Little Help needed moving from lying on your back to sitting on the side of a flat bed without using bedrails?: A Little Help needed moving to and from a bed to a chair (including a wheelchair)?: A Lot Help needed standing up from a chair using your arms (e.g., wheelchair or bedside chair)?: A Lot Help needed to walk in hospital room?: A Lot Help needed climbing 3-5 steps with a railing? : Total 6 Click Score: 13    End of Session Equipment Utilized During Treatment: Gait belt Activity Tolerance: Patient tolerated treatment well Patient left: in chair;with call bell/phone within reach;with chair alarm set Nurse Communication: Mobility status PT Visit Diagnosis: Other abnormalities of gait and mobility (R26.89);Pain;Difficulty in walking, not elsewhere classified (R26.2) Pain - part of body: Leg     Time: 1610-9604 PT Time Calculation (min) (ACUTE ONLY): 40 min  Charges:  $Gait Training: 23-37 mins $Therapeutic Activity: 8-22 mins                     Karma Ganja, PT, DPT   Acute Rehabilitation Department Pager #: [phone]   Otho Bellows 06/27/2020, 2:52 PM

## 2020-06-27 NOTE — PMR Pre-admission (Signed)
PMR Admission Coordinator Pre-Admission Assessment  Patient: Eugene Diaz is an 54 y.o., male MRN: [id number] DOB: 1966-03-26 Height: 6' 2"  (188 cm) Weight: 132 kg  Insurance Information HMO:     PPO:  Yes    PCP:      IPA:      80/20:      OTHER:  PRIMARY: [place] [employer]      Policy#: [id number]      Subscriber: patient CM Name:       Phone#:      Fax#:  Pre-Cert#: [id number]      Employer:  Benefits: eligibility verified by Maryclare Bean Phone #: [phone]     Name:  Eff. Date: 04/30/20-12/31-21     Deduct: $0      Out of Pocket Max: $0      Life Max:  CIR: $0      SNF: $0 Outpatient:      Co-Pay: $3 Home Health: $0 co-pay; limited by medical necessity review      Co-Pay:  DME: $0 co-pay; limited by medical necessity review     Co-Pay:  Providers: Per Medicaid guidelines SECONDARY:       Policy#:      Phone#:   Development worker, community:       Phone#:   The Engineer, petroleum" for patients in Inpatient Rehabilitation Facilities with attached "Privacy Act [place] Records" was provided and verbally reviewed with: N/A  Emergency Contact Information Contact Information    Name Relation Home Work Mobile   Brando, Taves Mother [phone]     [facility] Sister   [phone]   [facility] Sister [phone]  [phone]      Current Medical History  Patient Admitting Diagnosis: s/p Left BKA History of Present Illness: Pt is a 54 y/o male with PMH of DM, anxiety, neuropathy (DM and ETOH), and ETOH abuse admitted to [facility] on 8/22 with c/o increasing LLE redness, swelling, pain, and drainage.  Reports being treated with antibiotics per podiatrist but symptoms ongoing.  Reports multiple falls, increasing weakness.  On admission, pt met sepsis criteria with fever, tachycardia, and +source of infection with osteomyelitis.  He was placed on broad spectrum abx through 8/28.  MRI showed acute osteomyelitis of L ankle and hindfoot and abscess of  soft tissues with septic arthritis.  Pt underwent I&D with podiatry on 8/23 and BKA per Dr. Sharol Given on 8/25.  Post op course ABLA with pt receiving 1 unit PRBCs on 8/24.  Therapy evaluations completed and pt was recommended for CIR.      Patient's medical record from [facility] has been reviewed by the rehabilitation admission coordinator and physician.  Past Medical History  Past Medical History:  Diagnosis Date  . Alcohol abuse   . Alcoholic peripheral neuropathy ([place])   . Anxiety   . Arthritis   . Asthma    followed by pcp  . B12 deficiency   . Depression   . Diabetic neuropathy ([place])   . Edema of both lower extremities   . Essential tremor    neurologist-- dr patel--- due to alcohol abuse  . GERD (gastroesophageal reflux disease)   . History of cellulitis 2020   left lower leg;   recurrent 03-25-2020  . History of esophageal stricture    s/p  dilatation 02-13-2018  . History of osteomyelitis 2020   left great toe    . Hypercholesterolemia   . Hypertension    followed by pcp  (nuclear  stress test 03-11-2014 low risk w/ no ischemia, ef 65%  . Normocytic anemia    followed by pcp   (03-18-2020 had transfusion's 02/ 2021)  . Peripheral vascular disease ([place])   . 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 Thrombocytopenia (HCC)    chronic  . Type 2 diabetes mellitus ([place])    followed by pcp---    (03-18-2020  pt stated does not check blood sugar)    Family History   family history includes Cancer in his father; Healthy in his brother and sister; Heart attack in his mother.  Prior Rehab/Hospitalizations Has the patient had prior rehab or hospitalizations prior to admission? Yes  Has the patient had major  surgery during 100 days prior to admission? Yes   Current Medications  Current Facility-Administered Medications:  .  acetaminophen (TYLENOL) tablet 650 mg, 650 mg, Oral, Q6H PRN **OR** acetaminophen (TYLENOL) suppository 650 mg, 650 mg, Rectal, Q6H PRN, Persons, Bevely Palmer, PA .  albuterol (PROVENTIL) (2.5 MG/3ML) 0.083% nebulizer solution 3 mL, 3 mL, Inhalation, Q6H PRN, [name], Jessica U, DO, 3 mL at 06/25/20 0956 .  ALPRAZolam Duanne Moron) tablet 0.5 mg, 0.5 mg, Oral, QID PRN, Persons, Bevely Palmer, PA, 0.5 mg at 06/29/20 2219 .  amitriptyline (ELAVIL) tablet 100 mg, 100 mg, Oral, QHS, Persons, Bevely Palmer, PA, 100 mg at 06/29/20 2220 .  atorvastatin (LIPITOR) tablet 40 mg, 40 mg, Oral, QPM, Persons, Bevely Palmer, PA, 40 mg at 06/29/20 1733 .  docusate sodium (COLACE) capsule 100 mg, 100 mg, Oral, BID, Persons, Bevely Palmer, PA, 100 mg at 06/30/20 0845 .  DULoxetine (CYMBALTA) DR capsule 60 mg, 60 mg, Oral, Daily, Persons, Bevely Palmer, [place], 60 mg at 06/30/20 0845 .  enoxaparin (LOVENOX) injection 65 mg, 65 mg, Subcutaneous, Q24H, Persons, Bevely Palmer, PA, 65 mg at 06/30/20 0847 .  folic acid (FOLVITE) tablet 1 mg, 1 mg, Oral, Daily, Persons, Bevely Palmer, [place], 1 mg at 06/30/20 0845 .  furosemide (LASIX) tablet 20 mg, 20 mg, Oral, Daily, Persons, Bevely Palmer, PA, 20 mg at 06/30/20 0845 .  gabapentin (NEURONTIN) capsule 1,200 mg, 1,200 mg, Oral, QHS, [name], Jessica U, DO, 1,200 mg at 06/29/20 2221 .  gabapentin (NEURONTIN) capsule 900 mg, 900 mg, Oral, BID, Eulogio Bear U, DO, 900 mg at 06/30/20 6160 .  glycopyrrolate (ROBINUL) tablet 1 mg, 1 mg, Oral, BID, Persons, Bevely Palmer, PA, 1 mg at 06/30/20 0848 .  hydrALAZINE (APRESOLINE) tablet 25 mg, 25 mg, Oral, Q8H, [name], Jessica U, DO, 25 mg at 06/30/20 0714 .  insulin aspart (novoLOG) injection 0-15 Units, 0-15 Units, Subcutaneous, TID WC, Persons, Bevely Palmer, [place], 3 Units at 06/30/20 [phone] .  insulin aspart (novoLOG) injection 0-5 Units, 0-5 Units, Subcutaneous, QHS, Persons,  Bevely Palmer, [place], 2 Units at 06/29/20 2228 .  insulin glargine (LANTUS) injection 10 Units, 10 Units, Subcutaneous, Daily, Eulogio Bear U, DO, 10 Units at 06/30/20 0849 .  lisinopril (ZESTRIL) tablet 40 mg, 40 mg, Oral, QHS, [name], Jessica U, DO, 40 mg at 06/29/20 2223 .  metoCLOPramide (REGLAN) tablet 5-10 mg, 5-10 mg, Oral, Q8H PRN **OR** metoCLOPramide (REGLAN) injection 5-10 mg, 5-10 mg, Intravenous, Q8H PRN, Persons, Bevely Palmer, PA .  morphine (MS CONTIN) 12 hr tablet 30 mg, 30 mg, Oral, Q12H, Persons, Bevely Palmer, [place], 30 mg at 06/30/20 0850 .  multivitamin with minerals tablet 1 tablet, 1 tablet, Oral, Daily, Persons, Bevely Palmer, [place], 1 tablet at 06/30/20 0846 .  ondansetron (ZOFRAN) tablet 4 mg, 4 mg, Oral, Q6H PRN **OR** ondansetron (ZOFRAN) injection 4 mg, 4 mg, Intravenous, Q6H PRN, Persons, Bevely Palmer, PA .  oxyCODONE (Oxy IR/ROXICODONE) immediate release tablet 30 mg, 30 mg, Oral, Q4H PRN, Persons, Bevely Palmer, PA, 30 mg at 06/29/20 2218 .  pantoprazole (PROTONIX) EC tablet 40 mg, 40 mg, Oral, Daily, Persons, Bevely Palmer, [place], 40 mg at 06/30/20 0844 .  polyethylene glycol (MIRALAX / GLYCOLAX) packet 17 g, 17 g, Oral, Daily PRN, Persons, Bevely Palmer, PA .  primidone (MYSOLINE) tablet 50 mg, 50 mg, Oral, BID, Persons, Bevely Palmer, [place], 50 mg at 06/30/20 0848 .  thiamine tablet 100 mg, 100 mg, Oral, Daily, 100 mg at 06/30/20 0845 **OR** thiamine (B-1) injection 100 mg, 100 mg, Intravenous, Daily, Persons, Bevely Palmer, PA  Patients Current Diet:  Diet Order            Diet - low sodium heart healthy           Diet Carb Modified           Diet heart healthy/carb modified Room service appropriate? Yes; Fluid consistency: Thin  Diet effective now                 Precautions / Restrictions Precautions Precautions: Fall Other Brace: Limb protector Restrictions Weight Bearing Restrictions: Yes LLE Weight Bearing: Non weight bearing   Has the patient had 2 or more falls or a fall with injury in the  past year? No  Prior Activity Level Limited Community (1-2x/wk): gets out of house ~2x/week  Prior Functional Level Self Care: Did the patient need help bathing, dressing, using the toilet or eating? Independent  Indoor Mobility: Did the patient need assistance with walking from room to room (with or without device)? Independent  Stairs: Did the patient need assistance with internal or external stairs (with or without device)? Independent  Functional Cognition: Did the patient need help planning regular tasks such as shopping or remembering to take medications? Needed some help  Home Assistive Devices / [place] Devices/Equipment: None Home Equipment: Walker - 2 wheels  Prior Device Use: Indicate devices/aids used by the patient prior to current illness, exacerbation or injury? crutches  Current Functional Level Cognition  Overall Cognitive Status: Impaired/Different from baseline Current Attention Level: Sustained Orientation Level: Oriented X4 Following Commands: Follows one step commands consistently, Follows one step commands with increased time General Comments: pt following multistep commands with no delay.    Extremity Assessment (includes Sensation/Coordination)  Upper Extremity Assessment: Defer to OT evaluation  Lower Extremity Assessment: LLE deficits/detail, RLE deficits/detail RLE Deficits / Details: noted area of warmth and errythema on mid shin, AROM limited knee flexion about 80 in supine, improved in sitting, strength at least 4/5 LLE Deficits / Details: L BKA with wound vac noted SLR and knee extension WFL, limited knee flexion with some edema present, did not test strength due to pain at least 3/5    ADLs  Overall ADL's : Needs assistance/impaired Eating/Feeding: Set up Grooming: Set up Upper Body Bathing: Set up, Supervision/ safety Lower Body Bathing: Set up, Moderate assistance, Cueing for safety, Cueing for sequencing, Sitting/lateral  leans Upper Body Dressing : Set up, Sitting Lower Body Dressing:  Supervision/safety, Set up, Moderate assistance, Sitting/lateral leans Toilet Transfer: +2 for physical assistance, Cueing for sequencing, Stand-pivot, RW Toilet Transfer Details (indicate cue type and reason): VC's for RW management Toileting- Clothing Manipulation and Hygiene: Moderate assistance, Sitting/lateral lean Functional mobility during ADLs: +2 for physical assistance, Rolling walker    Mobility  Overal bed mobility: Needs Assistance Bed Mobility: Supine to Sit Supine to sit: Supervision General bed mobility comments: Pt seated in recliner.    Transfers  Overall transfer level: Needs assistance Equipment used: Rolling walker (2 wheeled) Transfers: Sit to/from Stand Sit to Stand: Min assist Stand pivot transfers: Min assist General transfer comment: Min A +1 with cues for hand placement to rise into standing from recliner chair.    Ambulation / Gait / Stairs / Wheelchair Mobility  Ambulation/Gait Ambulation/Gait assistance: Herbalist (Feet): 25 Feet Assistive device: Rolling walker (2 wheeled) Gait Pattern/deviations: Step-to pattern, Trunk flexed General Gait Details: Hop to pattern with flexed posture. Cues for upper trunk control and RW safety.    Posture / Balance Dynamic Sitting Balance Sitting balance - Comments: sitting EOB with S Balance Overall balance assessment: Needs assistance Sitting-balance support: Bilateral upper extremity supported Sitting balance-Leahy Scale: Fair Sitting balance - Comments: sitting EOB with S Standing balance support: Bilateral upper extremity supported Standing balance-Leahy Scale: Poor Standing balance comment: UE support in standing    Special needs/care consideration Wound Vac yes, LLE, Skin surgical incision: Left residual LE, Diabetic management yes and Designated visitor Jolee Ewing, sister; Orestes Geiman, mother   Previous Home Environment  (from acute therapy documentation) Living Arrangements: Alone Available Help at Discharge: Family Type of Home: Mobile home Home Layout: One level Home Access: Stairs to enter Entrance Stairs-Rails: None Entrance Stairs-Number of Steps: 1 Bathroom Shower/Tub: Chiropodist: Standard Bathroom Accessibility: Yes How Accessible: Accessible via walker [place]: No Additional Comments: Neice supposted to stay with him  Discharge Living Setting Plans for Discharge Living Setting: Patient's home Type of Home at Discharge: Mobile home Discharge Home Layout: One level Discharge Home Access: Stairs to enter Entrance Stairs-Rails: None Entrance Stairs-Number of Steps: 1 Discharge Bathroom Shower/Tub: Tub/shower unit Discharge Bathroom Toilet: Standard Discharge Bathroom Accessibility: Yes How Accessible: Accessible via walker Does the patient have any problems obtaining your medications?: Yes (Describe)  Social/Family/Support Systems Anticipated Caregiver: sisters: Stacie Knutzen and [name] Anticipated Caregiver's Contact Information: Mickel Baas [phone] Discharge Plan Discussed with Primary Caregiver: Yes Is Caregiver In Agreement with Plan?: Yes Does Caregiver/Family have Issues with Lodging/Transportation while Pt is in Rehab?: No  Goals Patient/Family Goal for Rehab: PT/OT short distance ambulation with RW supervision, mod I w/c level Expected length of stay: 12-14 days Pt/Family Agrees to Admission and willing to participate: Yes Program Orientation Provided & Reviewed with Pt/Caregiver Including Roles  & Responsibilities: Yes  Decrease burden of Care through IP rehab admission: NA d/t anticipate pt meeting CIR goals  Possible need for SNF placement upon discharge: NA  Patient Condition: I have reviewed medical records from [facility], spoken with CM, and patient and family member. I met with patient at the bedside and  discussed via phone for inpatient rehabilitation assessment.  Patient will benefit from ongoing PT and OT, can actively participate in 3 hours of therapy a day 5 days of the week, and can make measurable gains during the admission.  Patient will also benefit from the coordinated team approach during an Inpatient Acute Rehabilitation admission.  The patient will receive intensive  therapy as well as Rehabilitation physician, nursing, social worker, and care management interventions.  Due to safety, skin/wound care, disease management, medication administration, pain management and patient education the patient requires 24 hour a day rehabilitation nursing.  The patient is currently min assist with mobility and basic ADLs.  Discharge setting and therapy post discharge at home with home health is anticipated.  Patient has agreed to participate in the Acute Inpatient Rehabilitation Program and will admit today.  Preadmission Screen Completed By: Gayland Curry, SLP, with updates by Courtney Heys, PT, DPT 06/30/2020 10:51 AM ______________________________________________________________________   Discussed status with Dr. Dagoberto Ligas on 06/30/20  at 10:51 AM  and received approval for admission today.  Admission Coordinator: Gayland Curry and brief updates by Courtney Heys, MD, DPT time 10:51 AM Sudie Grumbling 06/30/20    Assessment/Plan: Diagnosis: 1. Does the need for close, 24 hr/day Medical supervision in concert with the patient's rehab needs make it unreasonable for this patient to be served in a less intensive setting? Yes 2. 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 Due to bowel management, safety, skin/wound care, disease management, medication administration, pain management and patient education, does the patient require 24 hr/day rehab nursing? Yes 4. Does the patient require coordinated care of a  physician, rehab nurse, PT, OT, and SLP to address physical and functional deficits in the context of the above medical diagnosis(es)? Yes Addressing deficits in the following areas: balance, endurance, locomotion, strength, transferring, bathing, dressing, feeding, grooming and toileting 5. Can the patient actively participate in an intensive therapy program of at least 3 hrs of therapy 5 days a week? Yes 6. The potential for patient to make measurable gains while on inpatient rehab is good 7. Anticipated functional outcomes upon discharge from inpatient rehab: modified independent PT, modified independent OT, n/a SLP 8. Estimated rehab length of stay to reach the above functional goals is: 12-14 days 9. Anticipated discharge destination: Home 10. Overall Rehab/Functional Prognosis: good   MD Signature:

## 2020-06-27 NOTE — Progress Notes (Signed)
Progress Note    Eugene Diaz  ZOX:[id number] DOB: 1966-07-07  DOA: 06/21/2020 PCP: Eugene Munch, PA-C    Brief Narrative:     Medical records reviewed and are as summarized below:  Eugene Diaz is an 54 y.o. male with medical history significant for left foot osteomyelitis and amputation, diabetes mellitus, hypertension, alcoholism.  Patient presented to the ED with complaints of increasing left leg redness, swelling, pain and yellowish drainage ongoing for several days to weeks.  Reports he has been treated with courses of antibiotics prescribed by his podiatrist.  Was initially taken to the OR by podiatry but found to have such significant osteomyelitis that BKA was recommended. He is s/p BKA and is currently awaiting a safe d/c plan.   Assessment/Plan:   Principal Problem:   Diabetic infection of left foot ([place]) Active Problems:   ETOH abuse   Alcoholic peripheral neuropathy (HCC)   Diabetic neuropathy (HCC)   Sepsis ([place])   Subacute osteomyelitis, left ankle and foot ([place])   Depression with anxiety   Sepsis due to osteomyelitis  -patient met sepsis criteria on admission with fever, tachycardia, source.   - broad-spectrum antibiotics with Vanco, metronidazole, cefepime through 8/28 -MRI of the left fibula, which shows acute osteomyelitis in the left ankle and hindfoot, as well as abscess in the soft tissue and septic arthritis.  - Podiatry consulted, patient went to the OR on 8/23 for I&D but given the extent of the infection podiatry recommended surgical consult for BKA. -Dr. 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    Family Communication/Anticipated D/C date and plan/Code Status   DVT prophylaxis: Lovenox Code Status: Full Code.  Family Communication: patient  Disposition Plan: Status is: Inpatient  Remains inpatient appropriate because:Inpatient level of care appropriate due to severity of illness   Dispo: The patient is from: Home              Anticipated d/c is to: tbd-- CIR?              Anticipated d/c date is: when safe d/c arranged              Patient currently is medically ready         Medical Consultants:    Podiatry  ortho     Subjective:   No overnight issues  Objective:    Vitals:   06/26/20 0801 06/26/20 1708 06/26/20 2149 06/27/20 0335  BP: (!) 170/82 (!) 143/95 (!) 162/87 (!) 174/88  Pulse: 79 90 93 89  Resp: 18 18 18 18   Temp: 98.7 F (37.1 C) 97.9 F (36.6 C) 98.6 F (37 C) 98.6 F (37 C)  TempSrc: Oral Oral Oral Oral  SpO2: 97% 96% 98% 98%  Weight:      Height:        Intake/Output Summary (Last 24 hours) at 06/27/2020 8119 Last data  filed at 06/27/2020 9390 Gross per 24 hour  Intake 240 ml  Output 2000 ml  Net -1760 ml   Filed Weights   06/21/20 2349 06/22/20 1855 06/24/20 1702  Weight: 132 kg 132 kg 132 kg    Exam:   General: Appearance:    Obese male in no acute distress     Lungs:     respirations unlabored  Heart:    Normal heart rate. Normal rhythm. No murmurs, rubs, or gallops.   MS:   Left below knee  amputation noted.  Wound vac in place, no output  Neurologic:   Awake, alert, oriented x 3. No apparent focal neurological           defect.    Data Reviewed:   I have personally reviewed following labs and imaging studies:  Labs: Labs show the  following:   Basic Metabolic Panel: Recent Labs  Lab 06/21/20 1414 06/21/20 1414 06/23/20 0539 06/23/20 0539 06/24/20 0530 06/25/20 0337  NA 133*  --  134*  --  129* 134*  K 4.3   < > 5.2*   < > 4.8 4.4  CL 102  --  103  --  99 103  CO2 22  --  24  --  21* 23  GLUCOSE 280*  --  285*  --  369* 245*  BUN 16  --  11  --  25* 21*  CREATININE 1.24  --  1.07  --  1.14 1.06  CALCIUM 8.8*  --  8.6*  --  8.4* 8.4*  MG 1.9  --  1.7  --  1.9  --   PHOS 2.7  --  4.3  --   --   --    < > = values in this interval not displayed.   GFR Estimated Creatinine Clearance: 113.7 mL/min (by C-G formula based on SCr of 1.06 mg/dL). Liver Function Tests: Recent Labs  Lab 06/21/20 1414 06/23/20 0539 06/24/20 0530  AST 19 11* 9*  ALT 9 9 10   ALKPHOS 92 65 61  BILITOT 0.5 0.2* <0.1*  PROT 8.5* 6.6 7.1  ALBUMIN 2.9* 1.9* 2.0*   No results for input(s): LIPASE, AMYLASE in the last 168 hours. No results for input(s): AMMONIA in the last 168 hours. Coagulation profile Recent Labs  Lab 06/21/20 1414  INR 1.1    CBC: Recent Labs  Lab 06/21/20 1414 06/23/20 0539 06/24/20 0530 06/25/20 0337  WBC 8.8 6.9 6.8 5.0  NEUTROABS 7.1  --   --   --   HGB 8.5* 6.6* 7.3* 7.7*  HCT 27.8* 21.8* 23.5* 25.2*  MCV 86.9 85.2 86.4 86.3  PLT 374 264 294 266   Cardiac Enzymes: No results for input(s): CKTOTAL, CKMB, CKMBINDEX, TROPONINI in the last 168 hours. BNP (last 3 results) No results for input(s): PROBNP in the last 8760 hours. CBG: Recent Labs  Lab 06/26/20 0651 06/26/20 1147 06/26/20 1656 06/26/20 2048 06/27/20 0653  GLUCAP 299* 195* 187* 171* 191*   D-Dimer: No results for input(s): DDIMER in the last 72 hours. Hgb A1c: No results for input(s): HGBA1C in the last 72 hours. Lipid Profile: No results for input(s): CHOL, HDL, LDLCALC, TRIG, CHOLHDL, LDLDIRECT in the last 72 hours. Thyroid function studies: No results for input(s): TSH, T4TOTAL, T3FREE, THYROIDAB in the last 72  hours.  Invalid input(s): FREET3 Anemia work up: No results for input(s): VITAMINB12, FOLATE, FERRITIN, TIBC, IRON, RETICCTPCT in the last 72 hours. Sepsis Labs: Recent Labs  Lab  06/21/20 1414 06/21/20 1559 06/23/20 0539 06/24/20 0530 06/25/20 0337  WBC 8.8  --  6.9 6.8 5.0  LATICACIDVEN 2.2* 1.8  --   --   --     Microbiology Recent Results (from the past 240 hour(s))  SARS Coronavirus 2 by RT PCR (hospital order, performed in [facility] hospital lab) Nasopharyngeal Nasopharyngeal Swab     Status: None   Collection Time: 06/21/20  2:09 PM   Specimen: Nasopharyngeal Swab  Result Value Ref Range Status   SARS Coronavirus 2 NEGATIVE NEGATIVE Final    Comment: (NOTE) SARS-CoV-2 target nucleic acids are NOT DETECTED.  The SARS-CoV-2 RNA is generally detectable in upper and lower respiratory specimens during the acute phase of infection. The lowest concentration of SARS-CoV-2 viral copies this assay can detect is 250 copies / mL. A negative result does not preclude SARS-CoV-2 infection and should not be used as the sole basis for treatment or other patient management decisions.  A negative result may occur with improper specimen collection / handling, submission of specimen other than nasopharyngeal swab, presence of viral mutation(s) within the areas targeted by this assay, and inadequate number of viral copies (<250 copies / mL). A negative result must be combined with clinical observations, patient history, and epidemiological information.  Fact Sheet for Patients:   StrictlyIdeas.no  Fact Sheet for Healthcare Providers: BankingDealers.co.za  This test is not yet approved or  cleared by the [place] FDA and has been authorized for detection and/or diagnosis of SARS-CoV-2 by FDA under an Emergency Use Authorization (EUA).  This EUA will remain in effect (meaning this test can be used) for the duration of the COVID-19  declaration under Section 564(b)(1) of the Act, 21 U.S.C. section 360bbb-3(b)(1), unless the authorization is terminated or revoked sooner.  Performed at [facility], [address] [place], [place] [address]   Blood Culture (routine x 2)     Status: None   Collection Time: 06/21/20  2:16 PM   Specimen: Right Antecubital; Blood  Result Value Ref Range Status   Specimen Description   Final    RIGHT ANTECUBITAL BOTTLES DRAWN AEROBIC AND ANAEROBIC   Special Requests Blood Culture adequate volume  Final   Culture   Final    NO GROWTH 5 DAYS Performed at [facility], [address], [place], [place] [address]    Report Status 06/26/2020 FINAL  Final  Blood Culture (routine x 2)     Status: None   Collection Time: 06/21/20  2:16 PM   Specimen: Left Antecubital; Blood  Result Value Ref Range Status   Specimen Description   Final    LEFT ANTECUBITAL BOTTLES DRAWN AEROBIC AND ANAEROBIC   Special Requests Blood Culture adequate volume  Final   Culture   Final    NO GROWTH 5 DAYS Performed at [facility], [address] [place], [place] [address]    Report Status 06/26/2020 FINAL  Final  Urine culture     Status: Abnormal   Collection Time: 06/21/20  5:12 PM   Specimen: Urine, Catheterized  Result Value Ref Range Status   Specimen Description   Final    URINE, CATHETERIZED Performed at [facility], [address] [place], [place] [address]    Special Requests   Final    NONE Performed at [facility], [address], [place], [place] [address]    Culture (A)  Final    <10,000 COLONIES/mL INSIGNIFICANT GROWTH Performed at [facility], [place]  [address] [place], [place] [address]    Report Status 06/22/2020 FINAL  Final  Surgical pcr screen     Status: Abnormal   Collection Time: 06/22/20  2:10 AM   Specimen: Nasal Mucosa; Nasal Swab  Result Value Ref Range Status   MRSA, PCR NEGATIVE NEGATIVE Final   Staphylococcus aureus POSITIVE (A) NEGATIVE Final    Comment:  (NOTE) The Xpert SA Assay (FDA approved for NASAL specimens in patients 17 years of age and older), is one component of a comprehensive surveillance program. It is not intended to diagnose infection nor to guide or monitor treatment. Performed at [facility], [place] [address], [place], [place] [address]   Aerobic/Anaerobic Culture (surgical/deep wound)     Status: None (Preliminary result)   Collection Time: 06/22/20  7:44 PM   Specimen: Foot, Left; Wound  Result Value Ref Range Status   Specimen Description WOUND LEFT FOOT  Final   Special Requests NONE  Final   Gram Stain   Final    RARE WBC PRESENT, PREDOMINANTLY PMN RARE GRAM POSITIVE COCCI Performed at [facility], [place] [address], [place], [place] [address]    Culture   Final    FEW STAPHYLOCOCCUS AUREUS NO ANAEROBES ISOLATED; CULTURE IN PROGRESS FOR 5 DAYS    Report Status PENDING  Incomplete   Organism ID, Bacteria STAPHYLOCOCCUS AUREUS  Final      Susceptibility   Staphylococcus aureus - MIC*    CIPROFLOXACIN <=0.5 SENSITIVE Sensitive     ERYTHROMYCIN >=8 RESISTANT Resistant     GENTAMICIN <=0.5 SENSITIVE Sensitive     OXACILLIN 0.5 SENSITIVE Sensitive     TETRACYCLINE >=16 RESISTANT Resistant     VANCOMYCIN <=0.5 SENSITIVE Sensitive     TRIMETH/SULFA <=10 SENSITIVE Sensitive     CLINDAMYCIN >=8 RESISTANT Resistant     RIFAMPIN <=0.5 SENSITIVE Sensitive     Inducible Clindamycin NEGATIVE Sensitive     * FEW STAPHYLOCOCCUS AUREUS  Fungus Culture With Stain     Status: None (Preliminary result)   Collection Time: 06/22/20  8:04 PM   Specimen: Bone; Tissue  Result Value Ref Range Status   Fungus Stain Final report  Final    Comment: (NOTE) Performed At: [facility] [address] [place], [place] [id number] Rush Farmer MD AQ:[id number]    Fungus (Mycology) Culture PENDING  Incomplete   Fungal Source BONE  Final    Comment: TALUS Performed at [facility], [place] [address],  [place], [place] [address]   Aerobic/Anaerobic Culture (surgical/deep wound)     Status: None (Preliminary result)   Collection Time: 06/22/20  8:04 PM   Specimen: Bone; Tissue  Result Value Ref Range Status   Specimen Description BONE  Final   Special Requests TALUS  Final   Gram Stain   Final    NO WBC SEEN NO ORGANISMS SEEN Performed at [facility], [address], [place], [place] [address]    Culture   Final    RARE STAPHYLOCOCCUS AUREUS CRITICAL RESULT CALLED TO, READ BACK BY AND VERIFIED WITH: RN G.ANIRA AT 3428 ON 06/23/20 BY T.SAAD NO ANAEROBES ISOLATED; CULTURE IN PROGRESS FOR 5 DAYS    Report Status PENDING  Incomplete   Organism ID, Bacteria STAPHYLOCOCCUS AUREUS  Final      Susceptibility   Staphylococcus aureus - MIC*    CIPROFLOXACIN <=0.5 SENSITIVE Sensitive     ERYTHROMYCIN >=8 RESISTANT Resistant     GENTAMICIN <=0.5 SENSITIVE Sensitive  OXACILLIN 0.5 SENSITIVE Sensitive     TETRACYCLINE >=16 RESISTANT Resistant     VANCOMYCIN <=0.5 SENSITIVE Sensitive     TRIMETH/SULFA <=10 SENSITIVE Sensitive     CLINDAMYCIN >=8 RESISTANT Resistant     RIFAMPIN <=0.5 SENSITIVE Sensitive     Inducible Clindamycin NEGATIVE Sensitive     * RARE STAPHYLOCOCCUS AUREUS  Fungus Culture Result     Status: None   Collection Time: 06/22/20  8:04 PM  Result Value Ref Range Status   Result 1 Comment  Final    Comment: (NOTE) KOH/Calcofluor preparation:  no fungus observed. Performed At: [facility] [place], [place] [id number] Rush Farmer MD WU:[id number]     Procedures and diagnostic studies:  No results found.  Medications:   . amitriptyline  100 mg Oral QHS  . atorvastatin  40 mg Oral QPM  . docusate sodium  100 mg Oral BID  . DULoxetine  60 mg Oral Daily  . enoxaparin (LOVENOX) injection  65 mg Subcutaneous Q24H  . folic acid  1 mg Oral Daily  . furosemide  20 mg Oral Daily  . gabapentin  1,200 mg Oral QHS  . gabapentin  900 mg Oral  BID  . glycopyrrolate  1 mg Oral BID  . insulin aspart  0-15 Units Subcutaneous TID WC  . insulin aspart  0-5 Units Subcutaneous QHS  . insulin glargine  10 Units Subcutaneous Daily  . lisinopril  40 mg Oral QHS  . morphine  30 mg Oral Q12H  . multivitamin with minerals  1 tablet Oral Daily  . pantoprazole  40 mg Oral Daily  . primidone  50 mg Oral BID  . thiamine  100 mg Oral Daily   Or  . thiamine  100 mg Intravenous Daily   Continuous Infusions: . sodium chloride 75 mL/hr at 06/24/20 2211  . ceFEPime (MAXIPIME) IV 2 g (06/27/20 0533)  . metronidazole 500 mg (06/27/20 9562)  . vancomycin 1,000 mg (06/27/20 0424)     LOS: 6 days   Geradine Girt  [facility]   How to contact the [facility] Attending or Consulting provider [place] or covering provider during after hours [place], for this patient?  1. Check the care team in [facility] and look for a) attending/consulting [facility] provider listed and b) the [facility] team listed 2. Log into [email or web address] and use [place]'s universal password to access. If you do not have the password, please contact the hospital operator. 3. Locate the [facility] provider you are looking for under [facility] and page to a number that you can be directly reached. 4. If you still have difficulty reaching the provider, please page the [facility] (Director on Call) for the Hospitalists listed on amion for assistance.  06/27/2020, 9:05 AM

## 2020-06-27 NOTE — Plan of Care (Signed)

## 2020-06-28 ENCOUNTER — Encounter (HOSPITAL_COMMUNITY): Payer: Self-pay | Admitting: Internal Medicine

## 2020-06-28 LAB — CBC
HCT: 25.4 % — ABNORMAL LOW (ref 39.0–52.0)
Hemoglobin: 7.8 g/dL — ABNORMAL LOW (ref 13.0–17.0)
MCH: 26.1 pg (ref 26.0–34.0)
MCHC: 30.7 g/dL (ref 30.0–36.0)
MCV: 84.9 fL (ref 80.0–100.0)
Platelets: 313 10*3/uL (ref 150–400)
RBC: 2.99 MIL/uL — ABNORMAL LOW (ref 4.22–5.81)
RDW: 15.7 % — ABNORMAL HIGH (ref 11.5–15.5)
WBC: 7.5 10*3/uL (ref 4.0–10.5)
nRBC: 0 % (ref 0.0–0.2)

## 2020-06-28 LAB — BASIC METABOLIC PANEL
Anion gap: 8 (ref 5–15)
BUN: 17 mg/dL (ref 6–20)
CO2: 25 mmol/L (ref 22–32)
Calcium: 9.4 mg/dL (ref 8.9–10.3)
Chloride: 101 mmol/L (ref 98–111)
Creatinine, Ser: 0.94 mg/dL (ref 0.61–1.24)
GFR calc Af Amer: >60 mL/min (ref >60)
GFR calc non Af Amer: >60 mL/min (ref >60)
Glucose, Bld: 181 mg/dL — ABNORMAL HIGH (ref 70–99)
Potassium: 4.4 mmol/L (ref 3.5–5.1)
Sodium: 134 mmol/L — ABNORMAL LOW (ref 135–145)

## 2020-06-28 LAB — GLUCOSE, CAPILLARY
Glucose-Capillary: 140 mg/dL — ABNORMAL HIGH (ref 70–99)
Glucose-Capillary: 146 mg/dL — ABNORMAL HIGH (ref 70–99)
Glucose-Capillary: 162 mg/dL — ABNORMAL HIGH (ref 70–99)
Glucose-Capillary: 207 mg/dL — ABNORMAL HIGH (ref 70–99)

## 2020-06-28 NOTE — Progress Notes (Signed)
Progress Note    Eugene Diaz  WHQ:[id number] DOB: 1965-12-13  DOA: 06/21/2020 PCP: [name], PA-C    Brief Narrative:     Medical records reviewed and are as summarized below:  Eugene Diaz is an 54 y.o. male with medical history significant for left foot osteomyelitis and amputation, diabetes mellitus, hypertension, alcoholism.  Patient presented to the ED with complaints of increasing left leg redness, swelling, pain and yellowish drainage ongoing for several days to weeks.  Reports he has been treated with courses of antibiotics prescribed by his podiatrist.  Was initially taken to the OR by podiatry but found to have such significant osteomyelitis that BKA was recommended. He is s/p BKA and is currently awaiting a safe d/c plan.   Assessment/Plan:   Principal Problem:   Diabetic infection of left foot ([place]) Active Problems:   ETOH abuse   Alcoholic peripheral neuropathy (HCC)   Diabetic neuropathy (HCC)   Sepsis ([place])   Subacute osteomyelitis, left ankle and foot ([place])   Depression with anxiety   Sepsis due to osteomyelitis  -patient met sepsis criteria on admission with fever, tachycardia, source.   - broad-spectrum antibiotics with Vanco, metronidazole, cefepime through 8/28 -MRI of the left fibula, which shows acute osteomyelitis in the left ankle and hindfoot, as well as abscess in the soft tissue and septic arthritis.  - Podiatry consulted, patient went to the OR on 8/23 for I&D but given the extent of the infection podiatry recommended surgical consult for BKA. -Dr. 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    Family Communication/Anticipated D/C date and plan/Code Status   DVT prophylaxis: Lovenox Code Status: Full Code.  Family Communication: patient  Disposition Plan: Status is: Inpatient  Remains inpatient appropriate because:Inpatient level of care appropriate due to severity of illness   Dispo: The patient is from: Home              Anticipated d/c is to: tbd-- CIR?              Anticipated d/c date is: when safe d/c arranged              Patient currently is medically ready         Medical Consultants:    Podiatry  ortho     Subjective:   No overnight events  Objective:    Vitals:   06/27/20 1500 06/27/20 1918 06/28/20 0357 06/28/20 0749  BP: (!) 174/92 (!) 159/83 (!) 154/83 (!) 149/74  Pulse: 84 77 79 72  Resp:  _0 Temp: 98.9 F (37.2 C) 98.2 F (36.8 C) (!) 97.5 F (36.4 C) 98 F (36.7 C)  TempSrc: Oral Oral Oral Oral  SpO2: 99% 98% 97% 96%  Weight:      Height:        Intake/Output Summary (Last 24 hours) at 06/28/2020 1027 Last  data filed at 06/28/2020 0700 Gross per 24 hour  Intake 2808.23 ml  Output 3020 ml  Net -211.77 ml   Filed Weights   06/21/20 2349 06/22/20 1855 06/24/20 1702  Weight: 132 kg 132 kg 132 kg    Exam:   General: Appearance:    Obese male in no acute distress     Lungs:      respirations unlabored  Heart:    Normal heart rate. Normal rhythm. No murmurs, rubs, or gallops.   MS:    Left below knee amputation noted.   Neurologic:   Awake, alert, oriented x 3. No apparent focal neurological           defect.     Data Reviewed:   I have personally reviewed following labs and imaging studies:  Labs: Labs show the following:    Basic Metabolic Panel: Recent Labs  Lab 06/21/20 1414 06/21/20 1414 06/23/20 0539 06/23/20 0539 06/24/20 0530 06/24/20 0530 06/25/20 0337 06/28/20 0134  NA 133*  --  134*  --  129*  --  134* 134*  K 4.3   < > 5.2*   < > 4.8   < > 4.4 4.4  CL 102  --  103  --  99  --  103 101  CO2 22  --  24  --  21*  --  23 25  GLUCOSE 280*  --  285*  --  369*  --  245* 181*  BUN 16  --  11  --  25*  --  21* 17  CREATININE 1.24  --  1.07  --  1.14  --  1.06 0.94  CALCIUM 8.8*  --  8.6*  --  8.4*  --  8.4* 9.4  MG 1.9  --  1.7  --  1.9  --   --   --   PHOS 2.7  --  4.3  --   --   --   --   --    < > = values in this interval not displayed.   GFR Estimated Creatinine Clearance: 128.2 mL/min (by C-G formula based on SCr of 0.94 mg/dL). Liver Function Tests: Recent Labs  Lab 06/21/20 1414 06/23/20 0539 06/24/20 0530  AST 19 11* 9*  ALT _0 ALKPHOS 92 65 61  BILITOT 0.5 0.2* <0.1*  PROT 8.5* 6.6 7.1  ALBUMIN 2.9* 1.9* 2.0*   No results for input(s): LIPASE, AMYLASE in the last 168 hours. No results for input(s): AMMONIA in the last 168 hours. Coagulation profile Recent Labs  Lab 06/21/20 1414  INR 1.1    CBC: Recent Labs  Lab 06/21/20 1414 06/23/20 0539 06/24/20 0530 06/25/20 0337 06/28/20 0134  WBC 8.8 6.9 6.8 5.0 7.5  NEUTROABS 7.1  --   --   --   --   HGB 8.5* 6.6* 7.3* 7.7* 7.8*  HCT 27.8* 21.8* 23.5* 25.2* 25.4*  MCV 86.9 85.2 86.4 86.3 84.9  PLT 374 264 294 266 313   Cardiac Enzymes: No results for input(s): CKTOTAL, CKMB, CKMBINDEX, TROPONINI in the last 168 hours. BNP (last 3 results) No results for input(s): PROBNP in the last 8760 hours. CBG: Recent Labs  Lab 06/27/20 1139 06/27/20 1631 06/27/20 2026 06/27/20 2212 06/28/20 0837  GLUCAP 198* 229* 172* 212* 140*   D-Dimer: No results for input(s): DDIMER in the last 72 hours. Hgb A1c: No results for input(s): HGBA1C in the last 72 hours. Lipid Profile: No results for input(s):  CHOL, HDL,  LDLCALC, TRIG, CHOLHDL, LDLDIRECT in the last 72 hours. Thyroid function studies: No results for input(s): TSH, T4TOTAL, T3FREE, THYROIDAB in the last 72 hours.  Invalid input(s): FREET3 Anemia work up: No results for input(s): VITAMINB12, FOLATE, FERRITIN, TIBC, IRON, RETICCTPCT in the last 72 hours. Sepsis Labs: Recent Labs  Lab 06/21/20 1414 06/21/20 1414 06/21/20 1559 06/23/20 0539 06/24/20 0530 06/25/20 0337 06/28/20 0134  WBC 8.8   < >  --  6.9 6.8 5.0 7.5  LATICACIDVEN 2.2*  --  1.8  --   --   --   --    < > = values in this interval not displayed.    Microbiology Recent Results (from the past 240 hour(s))  SARS Coronavirus 2 by RT PCR (hospital order, performed in [facility] hospital lab) Nasopharyngeal Nasopharyngeal Swab     Status: None   Collection Time: 06/21/20  2:09 PM   Specimen: Nasopharyngeal Swab  Result Value Ref Range Status   SARS Coronavirus 2 NEGATIVE NEGATIVE Final    Comment: (NOTE) SARS-CoV-2 target nucleic acids are NOT DETECTED.  The SARS-CoV-2 RNA is generally detectable in upper and lower respiratory specimens during the acute phase of infection. The lowest concentration of SARS-CoV-2 viral copies this assay can detect is 250 copies / mL. A negative result does not preclude SARS-CoV-2 infection and should not be used as the sole basis for treatment or other patient management decisions.  A negative result may occur with improper specimen collection / handling, submission of specimen other than nasopharyngeal swab, presence of viral mutation(s) within the areas targeted by this assay, and inadequate number of viral copies (<250 copies / mL). A negative result must be combined with clinical observations, patient history, and epidemiological information.  Fact Sheet for Patients:   StrictlyIdeas.no  Fact Sheet for Healthcare Providers: BankingDealers.co.za  This test is not yet approved or   cleared by the [place] FDA and has been authorized for detection and/or diagnosis of SARS-CoV-2 by FDA under an Emergency Use Authorization (EUA).  This EUA will remain in effect (meaning this test can be used) for the duration of the COVID-19 declaration under Section 564(b)(1) of the Act, 21 U.S.C. section 360bbb-3(b)(1), unless the authorization is terminated or revoked sooner.  Performed at [facility], [address] [place], [place] [address]   Blood Culture (routine x 2)     Status: None   Collection Time: 06/21/20  2:16 PM   Specimen: Right Antecubital; Blood  Result Value Ref Range Status   Specimen Description   Final    RIGHT ANTECUBITAL BOTTLES DRAWN AEROBIC AND ANAEROBIC   Special Requests Blood Culture adequate volume  Final   Culture   Final    NO GROWTH 5 DAYS Performed at [facility], [address], [place], [place] [address]    Report Status 06/26/2020 FINAL  Final  Blood Culture (routine x 2)     Status: None   Collection Time: 06/21/20  2:16 PM   Specimen: Left Antecubital; Blood  Result Value Ref Range Status   Specimen Description   Final    LEFT ANTECUBITAL BOTTLES DRAWN AEROBIC AND ANAEROBIC   Special Requests Blood Culture adequate volume  Final   Culture   Final    NO GROWTH 5 DAYS Performed at [facility], [address] [place], [place] [address]    Report Status 06/26/2020 FINAL  Final  Urine culture     Status: Abnormal   Collection Time: 06/21/20  5:12 PM   Specimen: Urine, Catheterized  Result Value Ref Range Status   Specimen Description   Final    URINE, CATHETERIZED Performed at [facility], [address] [place], [place] [address]    Special Requests   Final    NONE Performed at [place] [place], [place] [address]    Culture (A)  Final    <10,000 COLONIES/mL INSIGNIFICANT GROWTH Performed at [facility] [address] [place] [address] [place], [place] [address]    Report Status 06/22/2020 FINAL  Final   Surgical pcr screen     Status: Abnormal   Collection Time: 06/22/20  2:10 AM   Specimen: Nasal Mucosa; Nasal Swab  Result Value Ref Range Status   MRSA, PCR NEGATIVE NEGATIVE Final   Staphylococcus aureus POSITIVE (A) NEGATIVE Final    Comment: (NOTE) The Xpert SA Assay (FDA approved for NASAL specimens in patients 9 years of age and older), is one component of a comprehensive surveillance program. It is not intended to diagnose infection nor to guide or monitor treatment. Performed at [facility], [place] [address] [place], [place] [address]   Aerobic/Anaerobic Culture (surgical/deep wound)     Status: None   Collection Time: 06/22/20  7:44 PM   Specimen: Foot, Left; Wound  Result Value Ref Range Status   Specimen Description WOUND LEFT FOOT  Final   Special Requests NONE  Final   Gram Stain   Final    RARE WBC PRESENT, PREDOMINANTLY PMN RARE GRAM POSITIVE COCCI    Culture   Final    FEW STAPHYLOCOCCUS AUREUS NO ANAEROBES ISOLATED Performed at [facility], [address] [place], [place] [address]    Report Status 06/27/2020 FINAL  Final   Organism ID, Bacteria STAPHYLOCOCCUS AUREUS  Final      Susceptibility   Staphylococcus aureus - MIC*    CIPROFLOXACIN <=0.5 SENSITIVE Sensitive     ERYTHROMYCIN >=8 RESISTANT Resistant     GENTAMICIN <=0.5 SENSITIVE Sensitive     OXACILLIN 0.5 SENSITIVE Sensitive     TETRACYCLINE >=16 RESISTANT Resistant     VANCOMYCIN <=0.5 SENSITIVE Sensitive     TRIMETH/SULFA <=10 SENSITIVE Sensitive     CLINDAMYCIN >=8 RESISTANT Resistant     RIFAMPIN <=0.5 SENSITIVE Sensitive     Inducible Clindamycin NEGATIVE Sensitive     * FEW STAPHYLOCOCCUS AUREUS  Fungus Culture With Stain     Status: None (Preliminary result)   Collection Time: 06/22/20  8:04 PM   Specimen: Bone; Tissue  Result Value Ref Range Status   Fungus Stain Final report  Final    Comment: (NOTE) Performed At: [facility] [address] [place],  [place] [id number] Rush Farmer MD [id number]    Fungus (Mycology) Culture PENDING  Incomplete   Fungal Source BONE  Final    Comment: TALUS Performed at [facility], [place] [address] [place], [place] [address]   Aerobic/Anaerobic Culture (surgical/deep wound)     Status: None   Collection Time: 06/22/20  8:04 PM   Specimen: Bone; Tissue  Result Value Ref Range Status   Specimen Description BONE  Final   Special Requests TALUS  Final   Gram Stain NO WBC SEEN NO ORGANISMS SEEN   Final   Culture   Final    RARE STAPHYLOCOCCUS AUREUS CRITICAL RESULT CALLED TO, READ BACK BY AND VERIFIED WITH: RN G.ANIRA AT 3532 ON 06/23/20 BY T.SAAD NO ANAEROBES ISOLATED Performed at [name], [place] [address], [place], [place] [address]    Report Status 06/27/2020 FINAL  Final   Organism ID, Bacteria STAPHYLOCOCCUS AUREUS  Final      Susceptibility   Staphylococcus aureus - MIC*    CIPROFLOXACIN <=0.5 SENSITIVE Sensitive     ERYTHROMYCIN >=8 RESISTANT Resistant     GENTAMICIN <=0.5 SENSITIVE Sensitive     OXACILLIN 0.5 SENSITIVE Sensitive     TETRACYCLINE >=16 RESISTANT Resistant     VANCOMYCIN <=0.5 SENSITIVE Sensitive     TRIMETH/SULFA <=10 SENSITIVE Sensitive     CLINDAMYCIN >=8 RESISTANT Resistant     RIFAMPIN <=0.5 SENSITIVE Sensitive     Inducible Clindamycin NEGATIVE Sensitive     * RARE STAPHYLOCOCCUS AUREUS  Fungus Culture Result     Status: None   Collection Time: 06/22/20  8:04 PM  Result Value Ref Range Status   Result 1 Comment  Final    Comment: (NOTE) KOH/Calcofluor preparation:  no fungus observed. Performed At: [facility] [place], [place] [id number] Rush Farmer MD KJ:[id number]     Procedures and diagnostic studies:  No results found.  Medications:   . amitriptyline  100 mg Oral QHS  . atorvastatin  40 mg Oral QPM  . docusate sodium  100 mg Oral BID  . DULoxetine  60 mg Oral Daily  . enoxaparin (LOVENOX) injection  65  mg Subcutaneous Q24H  . folic acid  1 mg Oral Daily  . furosemide  20 mg Oral Daily  . gabapentin  1,200 mg Oral QHS  . gabapentin  900 mg Oral BID  . glycopyrrolate  1 mg Oral BID  . hydrALAZINE  25 mg Oral Q8H  . insulin aspart  0-15 Units Subcutaneous TID WC  . insulin aspart  0-5 Units Subcutaneous QHS  . insulin glargine  10 Units Subcutaneous Daily  . lisinopril  40 mg Oral QHS  . morphine  30 mg Oral Q12H  . multivitamin with minerals  1 tablet Oral Daily  . pantoprazole  40 mg Oral Daily  . primidone  50 mg Oral BID  . thiamine  100 mg Oral Daily   Or  . thiamine  100 mg Intravenous Daily   Continuous Infusions:    LOS: 7 days   Geradine Girt  [facility]   How to contact the [facility] Attending or Consulting provider [place] or covering provider during after hours [place], for this patient?  1. Check the care team in [facility] and look for a) attending/consulting TRH provider listed and b) the [facility] team listed 2. Log into [email or web address] and use [place]'s universal password to access. If you do not have the password, please contact the hospital operator. 3. Locate the [facility] provider you are looking for under [facility] and page to a number that you can be directly reached. 4. If you still have difficulty reaching the provider, please page the [facility] (Director on Call) for the Hospitalists listed on amion for assistance.  06/28/2020, 10:27 AM

## 2020-06-28 NOTE — Plan of Care (Signed)

## 2020-06-28 NOTE — Plan of Care (Signed)
  Problem: Education: Goal: Knowledge of General Education information will improve Description: Including pain rating scale, medication(s)/side effects and non-pharmacologic comfort measures Outcome: Progressing   Problem: Health Behavior/Discharge Planning: Goal: Ability to manage health-related needs will improve Outcome: Progressing   Problem: Activity: Goal: Risk for activity intolerance will decrease Outcome: Progressing   Problem: Elimination: Goal: Will not experience complications related to bowel motility Outcome: Progressing   Problem: Pain Managment: Goal: General experience of comfort will improve Outcome: Progressing   Problem: Safety: Goal: Ability to remain free from injury will improve Outcome: Progressing   

## 2020-06-29 LAB — GLUCOSE, CAPILLARY
Glucose-Capillary: 147 mg/dL — ABNORMAL HIGH (ref 70–99)
Glucose-Capillary: 129 mg/dL — ABNORMAL HIGH (ref 70–99)
Glucose-Capillary: 127 mg/dL — ABNORMAL HIGH (ref 70–99)
Glucose-Capillary: 132 mg/dL — ABNORMAL HIGH (ref 70–99)
Glucose-Capillary: 176 mg/dL — ABNORMAL HIGH (ref 70–99)
Glucose-Capillary: 213 mg/dL — ABNORMAL HIGH (ref 70–99)

## 2020-06-29 LAB — SURGICAL PATHOLOGY

## 2020-06-29 NOTE — NC FL2 (Signed)
[place] MEDICAID FL2 LEVEL OF CARE SCREENING TOOL     IDENTIFICATION  Patient Name: Eugene Diaz Birthdate: [birth year] Sex: male Admission Date (Current Location): 06/21/2020  [place] and [place] Number:  Kathleen Argue [id number] Facility and Address:  [facility], [place] [address], [place], [place] [address]      Provider Number: [id number]  Attending Physician Name and Address:  Geradine Girt, DO  Relative Name and Phone Number:  [name] - [phone]    Current Level of Care: Hospital Recommended Level of Care: [place] Prior Approval Number:    Date Approved/Denied:   PASRR Number: [id number]  Discharge Plan: SNF    Current Diagnoses: Patient Active Problem List   Diagnosis Date Noted  . Severe protein-calorie malnutrition ([place])   . Acute blood loss anemia 01/04/2020  . Asthma   . Depression with anxiety   . Osteomyelitis of ankle or foot, acute, left ([place]) 12/17/2019  . Hyperglycemia 12/17/2019  . Type 2 diabetes mellitus with foot ulcer ([place])   . Anemia, chronic disease 11/10/2019  . Acute osteomyelitis of left foot ([place]) 11/10/2019  . Marijuana use 11/10/2019  . Wound dehiscence 08/31/2019  . Osteomyelitis due to type 2 diabetes mellitus ([place]) 08/14/2019  . Acute osteomyelitis of toe of left foot ([place]) 08/14/2019  . Sepsis ([place]) 08/14/2019  . Hyponatremia 08/14/2019  . Normocytic anemia 08/14/2019  . B12 deficiency 08/14/2019  . Subacute osteomyelitis, left ankle and foot ([place])   . Cellulitis of toe, left   . Diabetic infection of left foot ([place])   . Toe fracture, left 07/13/2019  . Cellulitis 07/12/2019  . GERD (gastroesophageal reflux disease) 12/19/2018  . Hepatic steatosis 06/18/2018  . Gastritis, erosive   . Dysphagia, idiopathic 12/27/2017  . Rectal bleeding 12/27/2017  . Intention tremor 06/03/2016  . Left knee pain 12/04/2015  . Alcoholic peripheral neuropathy ([place]) 10/08/2014  . Diabetic  neuropathy ([place]) 10/08/2014  . Chest pain, rule out acute myocardial infarction 03/10/2014  . Thrombocytopenia ([place]) 03/10/2014  . ETOH abuse 03/10/2014  . Chest pain 03/10/2014  . Diabetes mellitus without complication ([place])   . Hypertension associated with type 2 diabetes mellitus ([place])   . Hyperlipidemia associated with type 2 diabetes mellitus (HCC)     Orientation RESPIRATION BLADDER Height & Weight     Self, Time, Situation, Place  Normal Continent Weight: 132 kg Height:  6\' 2"  (188 cm)  BEHAVIORAL SYMPTOMS/MOOD NEUROLOGICAL BOWEL NUTRITION STATUS      Continent Diet (See discharge summary)  AMBULATORY STATUS COMMUNICATION OF NEEDS Skin   Extensive Assist Verbally Surgical wounds, Wound Vac                       Personal Care Assistance Level of Assistance  Bathing, Dressing Bathing Assistance: Limited assistance   Dressing Assistance: Limited assistance     Functional Limitations Info  Sight, Hearing, Speech Sight Info: Adequate Hearing Info: Adequate Speech Info: Adequate    SPECIAL CARE FACTORS FREQUENCY  PT (By licensed PT), OT (By licensed OT)     PT Frequency: 5 x per week OT Frequency: 5 x per week            Contractures Contractures Info: Not present    Additional Factors Info  Code Status, Allergies Code Status Info: Full code Allergies Info: NKDA           Current Medications (06/29/2020):  This is the current hospital active medication list  Current Facility-Administered Medications  Medication Dose Route Frequency Provider Last Rate Last Admin  . acetaminophen (TYLENOL) tablet 650 mg  650 mg Oral Q6H PRN Persons, Bevely Palmer, PA       Or  . acetaminophen (TYLENOL) suppository 650 mg  650 mg Rectal Q6H PRN Persons, Bevely Palmer, PA      . albuterol (PROVENTIL) (2.5 MG/3ML) 0.083% nebulizer solution 3 mL  3 mL Inhalation Q6H PRN Eulogio Bear U, DO   3 mL at 06/25/20 0956  . ALPRAZolam Duanne Moron) tablet 0.5 mg  0.5 mg Oral QID PRN Persons,  Bevely Palmer, PA   0.5 mg at 06/29/20 0400  . amitriptyline (ELAVIL) tablet 100 mg  100 mg Oral QHS Persons, Bevely Palmer, PA   100 mg at 06/28/20 2221  . atorvastatin (LIPITOR) tablet 40 mg  40 mg Oral QPM Persons, Bevely Palmer, PA   40 mg at 06/28/20 1709  . docusate sodium (COLACE) capsule 100 mg  100 mg Oral BID Persons, Bevely Palmer, PA   100 mg at 06/29/20 5830  . DULoxetine (CYMBALTA) DR capsule 60 mg  60 mg Oral Daily Persons, Bevely Palmer, PA   60 mg at 06/29/20 0831  . enoxaparin (LOVENOX) injection 65 mg  65 mg Subcutaneous Q24H Persons, Bevely Palmer, PA   65 mg at 06/29/20 9407  . folic acid (FOLVITE) tablet 1 mg  1 mg Oral Daily Persons, Bevely Palmer, PA   1 mg at 06/29/20 0831  . furosemide (LASIX) tablet 20 mg  20 mg Oral Daily Persons, Bevely Palmer, PA   20 mg at 06/29/20 0831  . gabapentin (NEURONTIN) capsule 1,200 mg  1,200 mg Oral QHS [name], Jessica U, DO   1,200 mg at 06/28/20 2222  . gabapentin (NEURONTIN) capsule 900 mg  900 mg Oral BID Eulogio Bear U, DO   900 mg at 06/29/20 1511  . glycopyrrolate (ROBINUL) tablet 1 mg  1 mg Oral BID Persons, Bevely Palmer, PA   1 mg at 06/29/20 0831  . hydrALAZINE (APRESOLINE) tablet 25 mg  25 mg Oral Q8H [name], Jessica U, DO   25 mg at 06/29/20 1510  . insulin aspart (novoLOG) injection 0-15 Units  0-15 Units Subcutaneous TID WC Persons, Bevely Palmer, [place]   2 Units at 06/29/20 1236  . insulin aspart (novoLOG) injection 0-5 Units  0-5 Units Subcutaneous QHS Persons, Bevely Palmer, [place]   2 Units at 06/28/20 2231  . insulin glargine (LANTUS) injection 10 Units  10 Units Subcutaneous Daily Eulogio Bear U, DO   10 Units at 06/29/20 6808  . lisinopril (ZESTRIL) tablet 40 mg  40 mg Oral QHS [name], Jessica U, DO   40 mg at 06/28/20 2224  . metoCLOPramide (REGLAN) tablet 5-10 mg  5-10 mg Oral Q8H PRN Persons, Bevely Palmer, PA       Or  . metoCLOPramide (REGLAN) injection 5-10 mg  5-10 mg Intravenous Q8H PRN Persons, Bevely Palmer, PA      . morphine (MS CONTIN) 12 hr tablet 30 mg  30 mg Oral  Q12H Persons, Bevely Palmer, [place]   30 mg at 06/29/20 8110  . multivitamin with minerals tablet 1 tablet  1 tablet Oral Daily Persons, Bevely Palmer, [place]   1 tablet at 06/29/20 0831  . ondansetron (ZOFRAN) tablet 4 mg  4 mg Oral Q6H PRN Persons, Bevely Palmer, PA       Or  . ondansetron (ZOFRAN) injection 4 mg  4 mg Intravenous Q6H PRN Persons, Bevely Palmer, [place]      .  oxyCODONE (Oxy IR/ROXICODONE) immediate release tablet 30 mg  30 mg Oral Q4H PRN Persons, Bevely Palmer, PA   30 mg at 06/29/20 0400  . pantoprazole (PROTONIX) EC tablet 40 mg  40 mg Oral Daily Persons, Bevely Palmer, PA   40 mg at 06/29/20 0831  . polyethylene glycol (MIRALAX / GLYCOLAX) packet 17 g  17 g Oral Daily PRN Persons, Bevely Palmer, PA      . primidone (MYSOLINE) tablet 50 mg  50 mg Oral BID Persons, Bevely Palmer, PA   50 mg at 06/29/20 0867  . thiamine tablet 100 mg  100 mg Oral Daily Persons, Bevely Palmer, PA   100 mg at 06/29/20 6195   Or  . thiamine (B-1) injection 100 mg  100 mg Intravenous Daily Persons, Bevely Palmer, [place]         Discharge Medications: Please see discharge summary for a list of discharge medications.  Relevant Imaging Results:  Relevant Lab Results:   Additional Information SS# [id number]  Curlene Labrum, RN

## 2020-06-29 NOTE — Progress Notes (Signed)
Progress Note    Eugene Diaz  TWK:[id number] DOB: 1966-07-07  DOA: 06/21/2020 PCP: [name], PA-C    Brief Narrative:     Medical records reviewed and are as summarized below:  Eugene Diaz is an 54 y.o. male with medical history significant for left foot osteomyelitis and amputation, diabetes mellitus, hypertension, alcoholism.  Patient presented to the ED with complaints of increasing left leg redness, swelling, pain and yellowish drainage ongoing for several days to weeks.  Reports he has been treated with courses of antibiotics prescribed by his podiatrist.  Was initially taken to the OR by podiatry but found to have such significant osteomyelitis that BKA was recommended. He is s/p BKA and is currently awaiting a safe d/c plan.   Assessment/Plan:   Principal Problem:   Diabetic infection of left foot ([place]) Active Problems:   ETOH abuse   Alcoholic peripheral neuropathy (HCC)   Diabetic neuropathy (HCC)   Sepsis ([place])   Subacute osteomyelitis, left ankle and foot ([place])   Depression with anxiety   Sepsis due to osteomyelitis  -patient met sepsis criteria on admission with fever, tachycardia, source.  -- sepsis resolved - broad-spectrum antibiotics with Vanco, metronidazole, cefepime through 8/28 -MRI of the left fibula, which shows acute osteomyelitis in the left ankle and hindfoot, as well as abscess in the soft tissue and septic arthritis.  - Podiatry consulted, patient went to the OR on 8/23 for I&D but given the extent of the infection podiatry recommended surgical consult for BKA. -Dr. 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    Family Communication/Anticipated D/C date and plan/Code Status   DVT prophylaxis: Lovenox Code Status: Full Code.  Family Communication: patient  Disposition Plan: Status is: Inpatient  Remains inpatient appropriate because:Inpatient level of care appropriate due to severity of illness   Dispo: The patient is from: Home              Anticipated d/c is to: tbd-- CIR?              Anticipated d/c date is: when safe d/c arranged              Patient currently is medically ready         Medical Consultants:    Podiatry  ortho     Subjective:   In chair, no overnight events  Objective:    Vitals:   06/28/20 1559 06/28/20 1932 06/29/20 0337 06/29/20 0738  BP: (!) 167/88 (!) 157/87 (!) 165/90 (!) 162/84  Pulse: 75 84 71 70  Resp: _0 Temp: 98.9 F (37.2 C) 98.6 F (37 C) 98.2 F (36.8 C) 98.3 F (36.8 C)  TempSrc: Oral Oral Oral Oral  SpO2: 96% 100% 100% 100%  Weight:      Height:        Intake/Output Summary (Last 24 hours) at 06/29/2020  [place] filed at 06/29/2020 1236 Gross per 24 hour  Intake --  Output 4880 ml  Net -4880 ml   Filed Weights   06/21/20 2349 06/22/20 1855 06/24/20 1702  Weight: 132 kg 132 kg 132 kg    Exam:   General: Appearance:    Obese male in no acute distress     Lungs:     respirations unlabored  Heart:    Normal heart rate. Normal rhythm. No murmurs, rubs, or gallops.      Neurologic:   Awake, alert, oriented x 3. No apparent focal neurological           defect.    Data Reviewed:   I have personally reviewed following labs and imaging studies:  Labs: Labs show the following:   Basic Metabolic Panel: Recent  Labs  Lab 06/23/20 0539 06/23/20 0539 06/24/20 0530 06/24/20 0530 06/25/20 0337 06/28/20 0134  NA 134*  --  129*  --  134* 134*  K 5.2*   < > 4.8   < > 4.4 4.4  CL 103  --  99  --  103 101  CO2 24  --  21*  --  23 25  GLUCOSE 285*  --  369*  --  245* 181*  BUN 11  --  25*  --  21* 17  CREATININE 1.07  --  1.14  --  1.06 0.94  CALCIUM 8.6*  --  8.4*  --  8.4* 9.4  MG 1.7  --  1.9  --   --   --   PHOS 4.3  --   --   --   --   --    < > = values in this interval not displayed.   GFR Estimated Creatinine Clearance: 128.2 mL/min (by C-G formula based on SCr of 0.94 mg/dL). Liver Function Tests: Recent Labs  Lab 06/23/20 0539 06/24/20 0530  AST 11* 9*  ALT 9 10  ALKPHOS 65 61  BILITOT 0.2* <0.1*  PROT 6.6 7.1  ALBUMIN 1.9* 2.0*   No results for input(s): LIPASE, AMYLASE in the last 168 hours. No results for input(s): AMMONIA in the last 168 hours. Coagulation profile No results for input(s): INR, PROTIME in the last 168 hours.  CBC: Recent Labs  Lab 06/23/20 0539 06/24/20 0530 06/25/20 0337 06/28/20 0134  WBC 6.9 6.8 5.0 7.5  HGB 6.6* 7.3* 7.7* 7.8*  HCT 21.8* 23.5* 25.2* 25.4*  MCV 85.2 86.4 86.3 84.9  PLT 264 294 266 313   Cardiac Enzymes: No results for input(s): CKTOTAL, CKMB, CKMBINDEX, TROPONINI in the last 168 hours. BNP (last 3 results) No results for input(s): PROBNP in the last 8760 hours. CBG: Recent Labs  Lab 06/28/20 1612 06/28/20 2121 06/29/20 0634 06/29/20 0829 06/29/20 1155  GLUCAP 162* 207* 129* 127* 132*   D-Dimer: No results for input(s): DDIMER in the last 72 hours. Hgb A1c: No results for input(s): HGBA1C in the last 72 hours. Lipid Profile: No results for input(s): CHOL, HDL, LDLCALC, TRIG, CHOLHDL, LDLDIRECT in the last 72 hours. Thyroid function studies: No results for input(s): TSH, T4TOTAL, T3FREE, THYROIDAB in the last 72 hours.  Invalid input(s): FREET3 Anemia work up: No results for input(s): VITAMINB12, FOLATE,  FERRITIN, TIBC, IRON, RETICCTPCT in the last 72 hours. Sepsis Labs: Recent Labs  Lab 06/23/20 0539 06/24/20 0530 06/25/20 0337 06/28/20 0134  WBC 6.9 6.8 5.0 7.5    Microbiology Recent Results (from the past 240 hour(s))  SARS Coronavirus  2 by RT PCR (hospital order, performed in [facility] hospital lab) Nasopharyngeal Nasopharyngeal Swab     Status: None   Collection Time: 06/21/20  2:09 PM   Specimen: Nasopharyngeal Swab  Result Value Ref Range Status   SARS Coronavirus 2 NEGATIVE NEGATIVE Final    Comment: (NOTE) SARS-CoV-2 target nucleic acids are NOT DETECTED.  The SARS-CoV-2 RNA is generally detectable in upper and lower respiratory specimens during the acute phase of infection. The lowest concentration of SARS-CoV-2 viral copies this assay can detect is 250 copies / mL. A negative result does not preclude SARS-CoV-2 infection and should not be used as the sole basis for treatment or other patient management decisions.  A negative result may occur with improper specimen collection / handling, submission of specimen other than nasopharyngeal swab, presence of viral mutation(s) within the areas targeted by this assay, and inadequate number of viral copies (<250 copies / mL). A negative result must be combined with clinical observations, patient history, and epidemiological information.  Fact Sheet for Patients:   StrictlyIdeas.no  Fact Sheet for Healthcare Providers: BankingDealers.co.za  This test is not yet approved or  cleared by the [place] FDA and has been authorized for detection and/or diagnosis of SARS-CoV-2 by FDA under an Emergency Use Authorization (EUA).  This EUA will remain in effect (meaning this test can be used) for the duration of the COVID-19 declaration under Section 564(b)(1) of the Act, 21 U.S.C. section 360bbb-3(b)(1), unless the authorization is terminated or revoked sooner.  Performed at  [facility], [address], [place], [place] [address]   Blood Culture (routine x 2)     Status: None   Collection Time: 06/21/20  2:16 PM   Specimen: Right Antecubital; Blood  Result Value Ref Range Status   Specimen Description   Final    RIGHT ANTECUBITAL BOTTLES DRAWN AEROBIC AND ANAEROBIC   Special Requests Blood Culture adequate volume  Final   Culture   Final    NO GROWTH 5 DAYS Performed at [facility], [address], [place], [place] [address]    Report Status 06/26/2020 FINAL  Final  Blood Culture (routine x 2)     Status: None   Collection Time: 06/21/20  2:16 PM   Specimen: Left Antecubital; Blood  Result Value Ref Range Status   Specimen Description   Final    LEFT ANTECUBITAL BOTTLES DRAWN AEROBIC AND ANAEROBIC   Special Requests Blood Culture adequate volume  Final   Culture   Final    NO GROWTH 5 DAYS Performed at [facility], [address], [place], [place] [address]    Report Status 06/26/2020 FINAL  Final  Urine culture     Status: Abnormal   Collection Time: 06/21/20  5:12 PM   Specimen: Urine, Catheterized  Result Value Ref Range Status   Specimen Description   Final    URINE, CATHETERIZED Performed at [facility], [address] [place], [place] [address]    Special Requests   Final    NONE Performed at [facility], [address] [place], [place] [address]    Culture (A)  Final    <10,000 COLONIES/mL INSIGNIFICANT GROWTH Performed at [facility], [place] [address], [place], [place] [address]    Report Status 06/22/2020 FINAL  Final  Surgical pcr screen     Status: Abnormal   Collection Time: 06/22/20  2:10 AM   Specimen: Nasal Mucosa; Nasal Swab  Result Value Ref Range Status  MRSA, PCR NEGATIVE NEGATIVE Final   Staphylococcus aureus POSITIVE (A) NEGATIVE Final    Comment: (NOTE) The Xpert SA Assay (FDA approved for NASAL specimens in patients 5 years of age and older), is one component of a comprehensive surveillance program. It  is not intended to diagnose infection nor to guide or monitor treatment. Performed at [facility], [place] [address], [place], [place] [address]   Aerobic/Anaerobic Culture (surgical/deep wound)     Status: None   Collection Time: 06/22/20  7:44 PM   Specimen: Foot, Left; Wound  Result Value Ref Range Status   Specimen Description WOUND LEFT FOOT  Final   Special Requests NONE  Final   Gram Stain   Final    RARE WBC PRESENT, PREDOMINANTLY PMN RARE GRAM POSITIVE COCCI    Culture   Final    FEW STAPHYLOCOCCUS AUREUS NO ANAEROBES ISOLATED Performed at [facility], [address] [place], [place] [address]    Report Status 06/27/2020 FINAL  Final   Organism ID, Bacteria STAPHYLOCOCCUS AUREUS  Final      Susceptibility   Staphylococcus aureus - MIC*    CIPROFLOXACIN <=0.5 SENSITIVE Sensitive     ERYTHROMYCIN >=8 RESISTANT Resistant     GENTAMICIN <=0.5 SENSITIVE Sensitive     OXACILLIN 0.5 SENSITIVE Sensitive     TETRACYCLINE >=16 RESISTANT Resistant     VANCOMYCIN <=0.5 SENSITIVE Sensitive     TRIMETH/SULFA <=10 SENSITIVE Sensitive     CLINDAMYCIN >=8 RESISTANT Resistant     RIFAMPIN <=0.5 SENSITIVE Sensitive     Inducible Clindamycin NEGATIVE Sensitive     * FEW STAPHYLOCOCCUS AUREUS  Fungus Culture With Stain     Status: None (Preliminary result)   Collection Time: 06/22/20  8:04 PM   Specimen: Bone; Tissue  Result Value Ref Range Status   Fungus Stain Final report  Final    Comment: (NOTE) Performed At: [facility] [address] [place], [place] [id number] Rush Farmer MD GM:[id number]    Fungus (Mycology) Culture PENDING  Incomplete   Fungal Source BONE  Final    Comment: TALUS Performed at [facility], [place] [address] [place], [place] [address]   Aerobic/Anaerobic Culture (surgical/deep wound)     Status: None   Collection Time: 06/22/20  8:04 PM   Specimen: Bone; Tissue  Result Value Ref Range Status   Specimen Description BONE   Final   Special Requests TALUS  Final   Gram Stain NO WBC SEEN NO ORGANISMS SEEN   Final   Culture   Final    RARE STAPHYLOCOCCUS AUREUS CRITICAL RESULT CALLED TO, READ BACK BY AND VERIFIED WITH: RN G.ANIRA AT 7425 ON 06/23/20 BY T.SAAD NO ANAEROBES ISOLATED Performed at [facility], [place] [address] [place], [place] [address]    Report Status 06/27/2020 FINAL  Final   Organism ID, Bacteria STAPHYLOCOCCUS AUREUS  Final      Susceptibility   Staphylococcus aureus - MIC*    CIPROFLOXACIN <=0.5 SENSITIVE Sensitive     ERYTHROMYCIN >=8 RESISTANT Resistant     GENTAMICIN <=0.5 SENSITIVE Sensitive     OXACILLIN 0.5 SENSITIVE Sensitive     TETRACYCLINE >=16 RESISTANT Resistant     VANCOMYCIN <=0.5 SENSITIVE Sensitive     TRIMETH/SULFA <=10 SENSITIVE Sensitive     CLINDAMYCIN >=8 RESISTANT Resistant     RIFAMPIN <=0.5 SENSITIVE Sensitive     Inducible Clindamycin NEGATIVE Sensitive     * RARE STAPHYLOCOCCUS AUREUS  Fungus Culture Result     Status: None   Collection Time: 06/22/20  8:04 PM  Result Value Ref Range Status   Result 1 Comment  Final    Comment: (NOTE) KOH/Calcofluor preparation:  no fungus observed. Performed At: [facility] [place], [place] [id number] Rush Farmer MD BO:[id number]     Procedures and diagnostic studies:  No results found.  Medications:   . amitriptyline  100 mg Oral QHS  . atorvastatin  40 mg Oral QPM  . docusate sodium  100 mg Oral BID  . DULoxetine  60 mg Oral Daily  . enoxaparin (LOVENOX) injection  65 mg Subcutaneous Q24H  . folic acid  1 mg Oral Daily  . furosemide  20 mg Oral Daily  . gabapentin  1,200 mg Oral QHS  . gabapentin  900 mg Oral BID  . glycopyrrolate  1 mg Oral BID  . hydrALAZINE  25 mg Oral Q8H  . insulin aspart  0-15 Units Subcutaneous TID WC  . insulin aspart  0-5 Units Subcutaneous QHS  . insulin glargine  10 Units Subcutaneous Daily  . lisinopril  40 mg Oral QHS  . morphine  30 mg  Oral Q12H  . multivitamin with minerals  1 tablet Oral Daily  . pantoprazole  40 mg Oral Daily  . primidone  50 mg Oral BID  . thiamine  100 mg Oral Daily   Or  . thiamine  100 mg Intravenous Daily   Continuous Infusions:    LOS: 8 days   Geradine Girt  [facility]   How to contact the [facility] Attending or Consulting provider [place] or covering provider during after hours [place], for this patient?  1. Check the care team in [facility] and look for a) attending/consulting TRH provider listed and b) the [facility] team listed 2. Log into [email or web address] and use [place] universal password to access. If you do not have the password, please contact the hospital operator. 3. Locate the [facility] provider you are looking for under [facility] and page to a number that you can be directly reached. 4. If you still have difficulty reaching the provider, please page the [facility] (Director on Call) for the Hospitalists listed on amion for assistance.  06/29/2020, 12:37 PM

## 2020-06-29 NOTE — Plan of Care (Signed)

## 2020-06-29 NOTE — Progress Notes (Signed)
Physical Therapy Treatment Patient Details Name: Eugene Diaz MRN: [id number] DOB: Oct 09, 1966 Today's Date: 06/29/2020    History of Present Illness Eugene Diaz is an 54 y.o. male with medical history significant for left foot osteomyelitis and amputation, diabetes mellitus, hypertension, alcoholism.  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    PT Comments    Pt reclined in recliner on arrival.  Pt required decreased assistance to min A overall.  He continues to benefit from CIR placement to maximize functional gains before returning to private residence.  Pt is very motivated.   Follow Up Recommendations  CIR     Equipment Recommendations  Wheelchair cushion (measurements PT);Wheelchair (measurements PT)    Recommendations for Other Services Rehab consult     Precautions / Restrictions Precautions Precautions: Fall Required Braces or Orthoses: Other Brace Other Brace: Limb protector Restrictions Weight Bearing Restrictions: Yes LLE Weight Bearing: Non weight bearing    Mobility  Bed Mobility               General bed mobility comments: Pt seated in recliner.  Transfers Overall transfer level: Needs assistance Equipment used: Rolling walker (2 wheeled) Transfers: Sit to/from Stand Sit to Stand: Min assist         General transfer comment: Min A +1 with cues for hand placement to rise into standing from recliner chair.  Ambulation/Gait Ambulation/Gait assistance: Min assist Gait Distance (Feet): 25 Feet Assistive device: Rolling walker (2 wheeled) Gait Pattern/deviations: Step-to pattern;Trunk flexed     General Gait Details: Hop to pattern with flexed posture. Cues for upper trunk control and RW safety.   Stairs             Wheelchair Mobility    Modified Rankin (Stroke Patients Only)       Balance Overall balance assessment: Needs assistance Sitting-balance support: Bilateral upper  extremity supported Sitting balance-Leahy Scale: Fair Sitting balance - Comments: sitting EOB with S   Standing balance support: Bilateral upper extremity supported Standing balance-Leahy Scale: Poor Standing balance comment: UE support in standing                            Cognition Arousal/Alertness: Awake/alert Behavior During Therapy: WFL for tasks assessed/performed Overall Cognitive Status: Impaired/Different from baseline Area of Impairment: Problem solving                             Problem Solving: Slow processing        Exercises Amputee Exercises Hip Extension: AROM;Left;10 reps;Standing    General Comments        Pertinent Vitals/Pain Pain Assessment: 0-10 Pain Score: 7  Pain Location: L LE Pain Descriptors / Indicators: Discomfort;Grimacing Pain Intervention(s): Monitored during session;Repositioned    Home Living                      Prior Function            PT Goals (current goals can now be found in the care plan section) Acute Rehab PT Goals Patient Stated Goal: agreeable to rehab in hospital Potential to Achieve Goals: Good Additional Goals Additional Goal #1: Patient to propel w/c x 150' with S Progress towards PT goals: Progressing toward goals    Frequency    Min 4X/week      PT Plan Current plan remains  appropriate    Co-evaluation              AM-PAC PT "6 Clicks" Mobility   Outcome Measure  Help needed turning from your back to your side while in a flat bed without using bedrails?: A Little Help needed moving from lying on your back to sitting on the side of a flat bed without using bedrails?: A Little Help needed moving to and from a bed to a chair (including a wheelchair)?: A Little Help needed standing up from a chair using your arms (e.g., wheelchair or bedside chair)?: A Little Help needed to walk in hospital room?: A Little Help needed climbing 3-5 steps with a railing? : A  Lot 6 Click Score: 17    End of Session Equipment Utilized During Treatment: Gait belt Activity Tolerance: Patient tolerated treatment well Patient left: in chair;with call bell/phone within reach;with chair alarm set Nurse Communication: Mobility status PT Visit Diagnosis: Other abnormalities of gait and mobility (R26.89);Pain;Difficulty in walking, not elsewhere classified (R26.2) Pain - Right/Left: Left Pain - part of body: Leg     Time: 2446-2863 PT Time Calculation (min) (ACUTE ONLY): 12 min  Charges:  $Gait Training: 8-22 mins                     Erasmo Leventhal , PTA Acute Rehabilitation Services Pager [phone] Office [phone]     Yasmine Kilbourne Eli Hose 06/29/2020, 1:11 PM

## 2020-06-29 NOTE — TOC Transition Note (Signed)
Transition of Care [facility]) - CM/SW Discharge Note   Patient Details  Name: Eugene Diaz MRN: [id number] Date of Birth: 02-22-1966  [facility] CM/SW Contact:  Curlene Labrum, RN Phone Number: 06/29/2020, 2:52 PM   Clinical Narrative:    Case management spoke with the patient and the sister, [name] regarding patient's options for CIR versus SNF placement.  The sister, [name], will call and speak with the patient's in placing patient at a SNF in the event he is turned down for CIR.  I will continue to follow the patient and keep family updated at necessary regarding the matter.   Final next level of care: IP Rehab Facility Barriers to Discharge: Continued Medical Work up   Patient Goals and CMS Choice Patient states their goals for this hospitalization and ongoing recovery are:: Patient would like to discharge to CIR. CMS Medicare.gov Compare Post Acute Care list provided to:: Patient Choice offered to / list presented to : Patient  Discharge Placement                       Discharge Plan and Services   Discharge Planning Services: CM Consult                                 Social Determinants of Health (SDOH) Interventions     Readmission Risk Interventions Readmission Risk Prevention Plan 06/25/2020  Transportation Screening Complete  Medication Review ([place]) Complete  PCP or Specialist appointment within 3-5 days of discharge Complete  HRI or Home Care Consult Complete  SW Recovery Care/Counseling Consult Complete  Palliative Care Screening Complete  Skilled Nursing Facility Complete  Some recent data might be hidden

## 2020-06-29 NOTE — TOC Progression Note (Signed)
Transition of Care [facility]) - Progression Note    Patient Details  Name: Eugene Diaz MRN: [id number] Date of Birth: 29-Jul-1966  Transition of Care [facility]) CM/SW [place], RN Phone Number: 06/29/2020, 11:11 AM  Clinical Narrative:    Case management called and spoke with the patient's sister, Mickel Baas, on the phone to update her on CIR's progress on waiting on insurance authorization for the patient that was started on Saturday, 8/28 through the patient's Medicaid.  I explained that [facility] will be reaching out about whether the patient's insurance will approve the patient for CIR or not as soon as it comes back from the provider.  Mickel Baas, sister stated that she spoke with someone from CIR yesterday and is aware.  The patient is not vaccinated and refused vaccination from [facility] when offer.  The sister stated that she was comfortable with patient being placed with [facility] but would refuse placement to a SNF as a backup.  Will continue to follow and communicate with the patient and family with updates along with CIR.   Expected Discharge Plan: IP Rehab Facility Barriers to Discharge: Continued Medical Work up  Expected Discharge Plan and Services Expected Discharge Plan: [place]   Discharge Planning Services: CM Consult   Living arrangements for the past 2 months: Single Family Home                     Social Determinants of Health (SDOH) Interventions    Readmission Risk Interventions Readmission Risk Prevention Plan 06/25/2020  Transportation Screening Complete  Medication Review [employer]) Complete  PCP or Specialist appointment within 3-5 days of discharge Complete  HRI or Home Care Consult Complete  SW Recovery Care/Counseling Consult Complete  Palliative Care Screening Complete  Skilled Nursing Facility Complete  Some recent data might be hidden

## 2020-06-30 ENCOUNTER — Other Ambulatory Visit: Payer: Self-pay

## 2020-06-30 ENCOUNTER — Encounter (HOSPITAL_COMMUNITY): Payer: Self-pay | Admitting: Physical Medicine and Rehabilitation

## 2020-06-30 ENCOUNTER — Inpatient Hospital Stay (HOSPITAL_COMMUNITY)
Admission: RE | Admit: 2020-06-30 | Discharge: 2020-07-10 | DRG: 560 | Disposition: A | Discharge status: Home Health | Payer: Medicaid Other | Source: Intra-hospital | Attending: Physical Medicine and Rehabilitation | Admitting: Physical Medicine and Rehabilitation

## 2020-06-30 DIAGNOSIS — S88112A Complete traumatic amputation at level between knee and ankle, left lower leg, initial encounter: Secondary | ICD-10-CM | POA: Diagnosis present

## 2020-06-30 DIAGNOSIS — M1990 Unspecified osteoarthritis, unspecified site: Secondary | ICD-10-CM | POA: Diagnosis present

## 2020-06-30 DIAGNOSIS — D696 Thrombocytopenia, unspecified: Secondary | ICD-10-CM | POA: Diagnosis present

## 2020-06-30 DIAGNOSIS — F1010 Alcohol abuse, uncomplicated: Secondary | ICD-10-CM | POA: Diagnosis present

## 2020-06-30 DIAGNOSIS — E538 Deficiency of other specified B group vitamins: Secondary | ICD-10-CM | POA: Diagnosis present

## 2020-06-30 DIAGNOSIS — Z4781 Encounter for orthopedic aftercare following surgical amputation: Principal | ICD-10-CM

## 2020-06-30 DIAGNOSIS — Z87891 Personal history of nicotine dependence: Secondary | ICD-10-CM | POA: Diagnosis not present

## 2020-06-30 DIAGNOSIS — E119 Type 2 diabetes mellitus without complications: Secondary | ICD-10-CM

## 2020-06-30 DIAGNOSIS — E7800 Pure hypercholesterolemia, unspecified: Secondary | ICD-10-CM | POA: Diagnosis present

## 2020-06-30 DIAGNOSIS — Z809 Family history of malignant neoplasm, unspecified: Secondary | ICD-10-CM | POA: Diagnosis not present

## 2020-06-30 DIAGNOSIS — K219 Gastro-esophageal reflux disease without esophagitis: Secondary | ICD-10-CM | POA: Diagnosis present

## 2020-06-30 DIAGNOSIS — F419 Anxiety disorder, unspecified: Secondary | ICD-10-CM | POA: Diagnosis present

## 2020-06-30 DIAGNOSIS — I10 Essential (primary) hypertension: Secondary | ICD-10-CM | POA: Diagnosis present

## 2020-06-30 DIAGNOSIS — J45909 Unspecified asthma, uncomplicated: Secondary | ICD-10-CM | POA: Diagnosis present

## 2020-06-30 DIAGNOSIS — Z7984 Long term (current) use of oral hypoglycemic drugs: Secondary | ICD-10-CM

## 2020-06-30 DIAGNOSIS — G621 Alcoholic polyneuropathy: Secondary | ICD-10-CM | POA: Diagnosis present

## 2020-06-30 DIAGNOSIS — G546 Phantom limb syndrome with pain: Secondary | ICD-10-CM | POA: Diagnosis present

## 2020-06-30 DIAGNOSIS — E1142 Type 2 diabetes mellitus with diabetic polyneuropathy: Secondary | ICD-10-CM | POA: Diagnosis present

## 2020-06-30 DIAGNOSIS — G8929 Other chronic pain: Secondary | ICD-10-CM | POA: Diagnosis present

## 2020-06-30 DIAGNOSIS — G250 Essential tremor: Secondary | ICD-10-CM | POA: Diagnosis present

## 2020-06-30 DIAGNOSIS — E1151 Type 2 diabetes mellitus with diabetic peripheral angiopathy without gangrene: Secondary | ICD-10-CM | POA: Diagnosis present

## 2020-06-30 DIAGNOSIS — Z89512 Acquired absence of left leg below knee: Secondary | ICD-10-CM

## 2020-06-30 DIAGNOSIS — Z794 Long term (current) use of insulin: Secondary | ICD-10-CM | POA: Diagnosis not present

## 2020-06-30 DIAGNOSIS — K5900 Constipation, unspecified: Secondary | ICD-10-CM | POA: Diagnosis not present

## 2020-06-30 DIAGNOSIS — D62 Acute posthemorrhagic anemia: Secondary | ICD-10-CM | POA: Diagnosis present

## 2020-06-30 DIAGNOSIS — Z79899 Other long term (current) drug therapy: Secondary | ICD-10-CM | POA: Diagnosis not present

## 2020-06-30 DIAGNOSIS — E871 Hypo-osmolality and hyponatremia: Secondary | ICD-10-CM | POA: Diagnosis present

## 2020-06-30 DIAGNOSIS — Z8249 Family history of ischemic heart disease and other diseases of the circulatory system: Secondary | ICD-10-CM | POA: Diagnosis not present

## 2020-06-30 LAB — GLUCOSE, CAPILLARY
Glucose-Capillary: 157 mg/dL — ABNORMAL HIGH (ref 70–99)
Glucose-Capillary: 151 mg/dL — ABNORMAL HIGH (ref 70–99)
Glucose-Capillary: 211 mg/dL — ABNORMAL HIGH (ref 70–99)
Glucose-Capillary: 114 mg/dL — ABNORMAL HIGH (ref 70–99)

## 2020-06-30 MED ORDER — ENOXAPARIN SODIUM 80 MG/0.8ML ~~LOC~~ SOLN
65.0000 mg | SUBCUTANEOUS | Status: DC
  Administered 2020-07-01 – 2020-07-10 (×10): 65 mg via SUBCUTANEOUS
  Filled 2020-06-30 (×10): qty 0.65

## 2020-06-30 MED ORDER — PROSOURCE PLUS PO LIQD
30.0000 mL | Freq: Two times a day (BID) | ORAL | Status: DC
  Administered 2020-07-01 – 2020-07-09 (×17): 30 mL via ORAL
  Filled 2020-06-30 (×16): qty 30

## 2020-06-30 MED ORDER — PROCHLORPERAZINE 25 MG RE SUPP: 12.5000 mg | Freq: Four times a day (QID) | RECTAL | Status: DC | PRN

## 2020-06-30 MED ORDER — INSULIN ASPART 100 UNIT/ML ~~LOC~~ SOLN
0.0000 [IU] | Freq: Three times a day (TID) | SUBCUTANEOUS | Status: DC
  Administered 2020-07-01: 3 [IU] via SUBCUTANEOUS
  Administered 2020-07-01: 2 [IU] via SUBCUTANEOUS
  Administered 2020-07-05: 5 [IU] via SUBCUTANEOUS
  Administered 2020-07-05: 3 [IU] via SUBCUTANEOUS
  Administered 2020-07-06: 2 [IU] via SUBCUTANEOUS
  Administered 2020-07-06: 3 [IU] via SUBCUTANEOUS
  Administered 2020-07-07: 2 [IU] via SUBCUTANEOUS
  Administered 2020-07-07: 5 [IU] via SUBCUTANEOUS
  Administered 2020-07-07: 0 [IU] via SUBCUTANEOUS
  Administered 2020-07-08: 3 [IU] via SUBCUTANEOUS
  Administered 2020-07-09: 2 [IU] via SUBCUTANEOUS
  Administered 2020-07-09: 3 [IU] via SUBCUTANEOUS

## 2020-06-30 MED ORDER — DIPHENHYDRAMINE HCL 12.5 MG/5ML PO ELIX: 12.5000 mg | ORAL_SOLUTION | Freq: Four times a day (QID) | ORAL | Status: DC | PRN

## 2020-06-30 MED ORDER — ADULT MULTIVITAMIN W/MINERALS CH
1.0000 | ORAL_TABLET | Freq: Every day | ORAL | Status: DC
  Administered 2020-07-01 – 2020-07-10 (×10): 1 via ORAL
  Filled 2020-06-30 (×10): qty 1

## 2020-06-30 MED ORDER — PROCHLORPERAZINE EDISYLATE 10 MG/2ML IJ SOLN: 5.0000 mg | Freq: Four times a day (QID) | INTRAMUSCULAR | Status: DC | PRN

## 2020-06-30 MED ORDER — VITAMIN B-12 100 MCG PO TABS
100.0000 ug | ORAL_TABLET | Freq: Every day | ORAL | Status: DC
  Administered 2020-06-30 – 2020-07-10 (×11): 100 ug via ORAL
  Filled 2020-06-30 (×12): qty 1

## 2020-06-30 MED ORDER — INSULIN GLARGINE 100 UNIT/ML ~~LOC~~ SOLN
10.0000 [IU] | Freq: Every day | SUBCUTANEOUS | Status: AC
  Administered 2020-07-01: 10 [IU] via SUBCUTANEOUS
  Filled 2020-06-30: qty 0.1

## 2020-06-30 MED ORDER — PRIMIDONE 50 MG PO TABS
50.0000 mg | ORAL_TABLET | Freq: Two times a day (BID) | ORAL | Status: DC
  Administered 2020-06-30 – 2020-07-10 (×20): 50 mg via ORAL
  Filled 2020-06-30 (×20): qty 1

## 2020-06-30 MED ORDER — ALBUTEROL SULFATE (2.5 MG/3ML) 0.083% IN NEBU: 3.0000 mL | INHALATION_SOLUTION | Freq: Four times a day (QID) | RESPIRATORY_TRACT | Status: DC | PRN

## 2020-06-30 MED ORDER — HYDRALAZINE HCL 25 MG PO TABS: 25.0000 mg | ORAL_TABLET | Freq: Three times a day (TID) | ORAL | Status: DC

## 2020-06-30 MED ORDER — FOLIC ACID 1 MG PO TABS
1.0000 mg | ORAL_TABLET | Freq: Every day | ORAL | Status: DC
  Administered 2020-07-01 – 2020-07-10 (×10): 1 mg via ORAL
  Filled 2020-06-30 (×10): qty 1

## 2020-06-30 MED ORDER — FUROSEMIDE 20 MG PO TABS
20.0000 mg | ORAL_TABLET | Freq: Every day | ORAL | Status: DC
  Administered 2020-07-01 – 2020-07-10 (×10): 20 mg via ORAL
  Filled 2020-06-30 (×10): qty 1

## 2020-06-30 MED ORDER — FLEET ENEMA 7-19 GM/118ML RE ENEM: 1.0000 | ENEMA | Freq: Once | RECTAL | Status: DC | PRN

## 2020-06-30 MED ORDER — OXYCODONE HCL 5 MG PO TABS
30.0000 mg | ORAL_TABLET | ORAL | Status: DC | PRN
  Administered 2020-06-30 – 2020-07-10 (×20): 30 mg via ORAL
  Filled 2020-06-30 (×22): qty 6

## 2020-06-30 MED ORDER — PROCHLORPERAZINE MALEATE 5 MG PO TABS: 5.0000 mg | ORAL_TABLET | Freq: Four times a day (QID) | ORAL | Status: DC | PRN

## 2020-06-30 MED ORDER — POLYETHYLENE GLYCOL 3350 17 G PO PACK: 17.0000 g | PACK | Freq: Every day | ORAL | Status: DC | PRN

## 2020-06-30 MED ORDER — GLYCOPYRROLATE 1 MG PO TABS
1.0000 mg | ORAL_TABLET | Freq: Two times a day (BID) | ORAL | Status: DC
  Administered 2020-06-30 – 2020-07-10 (×20): 1 mg via ORAL
  Filled 2020-06-30 (×21): qty 1

## 2020-06-30 MED ORDER — GUAIFENESIN-DM 100-10 MG/5ML PO SYRP: 5.0000 mL | ORAL_SOLUTION | Freq: Four times a day (QID) | ORAL | Status: DC | PRN

## 2020-06-30 MED ORDER — THIAMINE HCL 100 MG/ML IJ SOLN
100.0000 mg | Freq: Every day | INTRAMUSCULAR | Status: DC
  Filled 2020-06-30 (×9): qty 1

## 2020-06-30 MED ORDER — DULOXETINE HCL 60 MG PO CPEP
60.0000 mg | ORAL_CAPSULE | Freq: Every day | ORAL | Status: DC
  Administered 2020-07-01 – 2020-07-10 (×10): 60 mg via ORAL
  Filled 2020-06-30 (×10): qty 1

## 2020-06-30 MED ORDER — MORPHINE SULFATE ER 15 MG PO TBCR
30.0000 mg | EXTENDED_RELEASE_TABLET | Freq: Two times a day (BID) | ORAL | Status: DC
  Administered 2020-06-30 – 2020-07-10 (×20): 30 mg via ORAL
  Filled 2020-06-30 (×20): qty 2

## 2020-06-30 MED ORDER — INSULIN GLARGINE 100 UNIT/ML ~~LOC~~ SOLN
10.0000 [IU] | Freq: Every day | SUBCUTANEOUS | 11 refills | Status: DC
Start: 2020-07-01 — End: 2020-07-10

## 2020-06-30 MED ORDER — TRAMADOL HCL 50 MG PO TABS
50.0000 mg | ORAL_TABLET | Freq: Four times a day (QID) | ORAL | Status: DC | PRN
  Administered 2020-07-07 – 2020-07-08 (×2): 50 mg via ORAL
  Filled 2020-06-30 (×2): qty 1

## 2020-06-30 MED ORDER — AMITRIPTYLINE HCL 25 MG PO TABS
100.0000 mg | ORAL_TABLET | Freq: Every day | ORAL | Status: DC
  Administered 2020-06-30 – 2020-07-09 (×10): 100 mg via ORAL
  Filled 2020-06-30 (×10): qty 4

## 2020-06-30 MED ORDER — ATORVASTATIN CALCIUM 40 MG PO TABS
40.0000 mg | ORAL_TABLET | Freq: Every evening | ORAL | Status: DC
  Administered 2020-07-01 – 2020-07-09 (×9): 40 mg via ORAL
  Filled 2020-06-30 (×9): qty 1

## 2020-06-30 MED ORDER — FERROUS SULFATE 325 (65 FE) MG PO TABS
325.0000 mg | ORAL_TABLET | Freq: Every day | ORAL | Status: DC
  Administered 2020-06-30 – 2020-07-10 (×11): 325 mg via ORAL
  Filled 2020-06-30 (×11): qty 1

## 2020-06-30 MED ORDER — SENNOSIDES-DOCUSATE SODIUM 8.6-50 MG PO TABS
2.0000 | ORAL_TABLET | Freq: Every day | ORAL | Status: DC
  Administered 2020-06-30 – 2020-07-09 (×10): 2 via ORAL
  Filled 2020-06-30 (×10): qty 2

## 2020-06-30 MED ORDER — INSULIN ASPART 100 UNIT/ML ~~LOC~~ SOLN
0.0000 [IU] | Freq: Every day | SUBCUTANEOUS | Status: DC
  Administered 2020-07-04: 2 [IU] via SUBCUTANEOUS

## 2020-06-30 MED ORDER — ALPRAZOLAM 0.5 MG PO TABS: 0.5000 mg | ORAL_TABLET | Freq: Four times a day (QID) | ORAL | Status: DC | PRN

## 2020-06-30 MED ORDER — ALUM & MAG HYDROXIDE-SIMETH 200-200-20 MG/5ML PO SUSP: 30.0000 mL | ORAL | Status: DC | PRN

## 2020-06-30 MED ORDER — BISACODYL 10 MG RE SUPP: 10.0000 mg | Freq: Every day | RECTAL | Status: DC | PRN

## 2020-06-30 MED ORDER — INSULIN ASPART 100 UNIT/ML ~~LOC~~ SOLN: 0.0000 [IU] | Freq: Every day | SUBCUTANEOUS | 11 refills | Status: DC

## 2020-06-30 MED ORDER — INSULIN ASPART 100 UNIT/ML ~~LOC~~ SOLN: 0.0000 [IU] | Freq: Three times a day (TID) | SUBCUTANEOUS | 11 refills | Status: DC

## 2020-06-30 MED ORDER — GLIMEPIRIDE 2 MG PO TABS
4.0000 mg | ORAL_TABLET | Freq: Every day | ORAL | Status: DC
  Administered 2020-06-30 – 2020-07-04 (×5): 4 mg via ORAL
  Filled 2020-06-30 (×5): qty 2

## 2020-06-30 MED ORDER — HYDRALAZINE HCL 25 MG PO TABS
25.0000 mg | ORAL_TABLET | Freq: Three times a day (TID) | ORAL | Status: DC
  Administered 2020-06-30 – 2020-07-03 (×8): 25 mg via ORAL
  Filled 2020-06-30 (×8): qty 1

## 2020-06-30 MED ORDER — ACETAMINOPHEN 325 MG PO TABS
325.0000 mg | ORAL_TABLET | ORAL | Status: DC | PRN
  Administered 2020-07-08: 650 mg via ORAL
  Filled 2020-06-30: qty 2

## 2020-06-30 MED ORDER — GABAPENTIN 300 MG PO CAPS
900.0000 mg | ORAL_CAPSULE | Freq: Two times a day (BID) | ORAL | Status: DC
  Administered 2020-07-01 – 2020-07-10 (×19): 900 mg via ORAL
  Filled 2020-06-30 (×19): qty 3

## 2020-06-30 MED ORDER — PANTOPRAZOLE SODIUM 40 MG PO TBEC
40.0000 mg | DELAYED_RELEASE_TABLET | Freq: Every day | ORAL | Status: DC
  Administered 2020-07-01 – 2020-07-10 (×10): 40 mg via ORAL
  Filled 2020-06-30 (×10): qty 1

## 2020-06-30 MED ORDER — LISINOPRIL 20 MG PO TABS
40.0000 mg | ORAL_TABLET | Freq: Every day | ORAL | Status: DC
  Administered 2020-06-30 – 2020-07-09 (×10): 40 mg via ORAL
  Filled 2020-06-30 (×10): qty 2

## 2020-06-30 MED ORDER — THIAMINE HCL 100 MG PO TABS
100.0000 mg | ORAL_TABLET | Freq: Every day | ORAL | Status: DC
  Administered 2020-07-01 – 2020-07-10 (×10): 100 mg via ORAL
  Filled 2020-06-30 (×10): qty 1

## 2020-06-30 MED ORDER — CYCLOBENZAPRINE HCL 5 MG PO TABS
5.0000 mg | ORAL_TABLET | Freq: Three times a day (TID) | ORAL | Status: DC | PRN
  Administered 2020-07-08: 5 mg via ORAL
  Filled 2020-06-30: qty 1

## 2020-06-30 MED ORDER — LISINOPRIL 40 MG PO TABS: 40.0000 mg | ORAL_TABLET | Freq: Every day | ORAL | Status: DC

## 2020-06-30 MED ORDER — GABAPENTIN 400 MG PO CAPS
1200.0000 mg | ORAL_CAPSULE | Freq: Every day | ORAL | Status: DC
  Administered 2020-06-30 – 2020-07-09 (×10): 1200 mg via ORAL
  Filled 2020-06-30 (×10): qty 3

## 2020-06-30 MED ORDER — TRAZODONE HCL 50 MG PO TABS: 25.0000 mg | ORAL_TABLET | Freq: Every evening | ORAL | Status: DC | PRN

## 2020-06-30 NOTE — Progress Notes (Signed)
Patient arrived on unit, oriented to unit. Reviewed medications, therapy schedule, rehab routine and plan of care. States an understanding of information reviewed. No complications noted at this time. Patient reports pain 6 out of 10 to left leg, AX4 [name]

## 2020-06-30 NOTE — Discharge Summary (Signed)
Physician Discharge Summary  Eugene Diaz DGU:[id number] DOB: 1966/03/14 DOA: 06/21/2020  PCP: [name], PA-C  Admit date: 06/21/2020 Discharge date: 06/30/2020  Admitted From: home Discharge disposition: CIR   Recommendations for Outpatient Follow-Up:   1. To CIR-- continue to titrate BP And BS medications   Discharge Diagnosis:   Principal Problem:   Diabetic infection of left foot ([place]) Active Problems:   ETOH abuse   Alcoholic peripheral neuropathy (HCC)   Diabetic neuropathy (HCC)   Sepsis ([place])   Subacute osteomyelitis, left ankle and foot ([place])   Depression with anxiety    Discharge Condition: Improved.  Diet recommendation: Low sodium, heart healthy.  Carbohydrate-modified.  Wound care: None.  Code status: Full.   History of Present Illness:   Eugene Diaz is a 54 y.o. male with medical history significant for left foot osteomyelitis and amputation, diabetes mellitus, hypertension, alcoholism. Patient presented to the ED with complaints of increasing left leg redness, swelling, pain and yellowish drainage ongoing for several days to weeks.  Reports he has been treated with courses of antibiotics prescribed by his podiatrist.  But symptoms have persisted. Reports multiple falls over the past few days, hitting his head, unable to get up because he is weak and has been having difficulty ambulating. No cough or difficulty breathing, no nausea vomiting or diarrhea, no abdominal pain, no pain with urination.   Hospital Course by Problem:   Sepsis due to osteomyelitis -patient met sepsis criteriaon admission with fever, tachycardia, source. -- sepsis resolved - broad-spectrum antibiotics with Vanco, metronidazole, cefepime through 8/28 -MRI of the left fibula, which shows acute osteomyelitis in the left ankle and hindfoot, as well as abscess in the soft tissue and septic arthritis.  -Podiatry consulted,patient went to the OR on 8/23  forI&D but given the extent of the infection podiatry recommended surgical consult for BKA. -Dr. 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    Medical Consultants:      Discharge Exam:   Vitals:   06/30/20 0300 06/30/20 0826  BP: 140/70 (!) 150/80  Pulse: 68 76  Resp: 16 17  Temp: 98 F (36.7 C) 97.9 F (36.6 C)  SpO2: 96% 96%   Vitals:   06/29/20 1342 06/29/20 1931 06/30/20 0300 06/30/20 0826  BP: (!) 151/74 (!) 148/87 140/70 (!) 150/80  Pulse: 80 75 68 76  Resp: 17 18 16 17   Temp: 97.7 F (36.5 C) 98.5 F (36.9 C) 98 F (36.7 C) 97.9 F (36.6 C)  TempSrc: Oral Oral Axillary Oral  SpO2: 98% 96% 96% 96%  Weight:      Height:        General exam: Appears calm and comfortable.     The results of significant diagnostics from this hospitalization (including imaging, microbiology, ancillary and laboratory) are listed below for reference.     Procedures and Diagnostic Studies:   DG Chest 2 View  Result Date: 06/21/2020 CLINICAL DATA:  Question sepsis.  Evaluate for abnormality. EXAM: CHEST - 2 VIEW COMPARISON:  December 29, 2019 FINDINGS: No pneumothorax. The heart, hila, and mediastinum are normal. No pulmonary nodules or masses. No focal infiltrates. IMPRESSION: No active cardiopulmonary disease. Electronically Signed   By: Dorise Bullion III M.D   On: 06/21/2020 14:31   DG Tibia/Fibula Left  Result Date:  06/21/2020 CLINICAL DATA:  Leg pain. Dressing on left leg with redness and swelling. EXAM: LEFT TIBIA AND FIBULA - 2 VIEW COMPARISON:  None. FINDINGS: Edema throughout the soft tissues. No soft tissue gas identified. There is periostitis associated with the distal tibia and fibula as well as osteopenia associated with both bones. There is also osteopenia in the posterior calcaneus, incompletely evaluated. Erosive changes seen along the posterior tibia on the lateral view. No other bony abnormalities. IMPRESSION: 1. Soft tissue edema throughout the lower leg without soft tissue gas. 2. Osteopenia in portions of the tibia, fibula, and posterior calcaneus. Apparent erosive change along the posterior tibia on the lateral view. Given the left foot partial amputation, these findings could be due to aggressive osteoporosis from lack of mobility. Osteomyelitis not excluded on this study. If there is clinical concern for osteomyelitis in the region of the ankle and hindfoot, MRI would be more sensitive and specific. Electronically Signed   By: Dorise Bullion III M.D   On: 06/21/2020 14:34   CT HEAD WO CONTRAST  Result Date: 06/21/2020 CLINICAL DATA:  Multiple falls with trauma to the head. EXAM: CT HEAD WITHOUT CONTRAST TECHNIQUE: Contiguous axial images were obtained from the base of the skull through the vertex without intravenous contrast. COMPARISON:  04/09/2020 FINDINGS: Brain: Mild age related volume loss. No evidence of old or acute focal infarction, mass lesion, hemorrhage, hydrocephalus or extra-axial collection. Vascular: There is atherosclerotic calcification of the major vessels at the base of the brain. Skull: Negative Sinuses/Orbits: Clear/normal Other: None IMPRESSION: No acute CT finding.  Mild age related volume loss. Electronically Signed   By: Nelson Chimes M.D.   On: 06/21/2020 17:01   MR TIBIA FIBULA LEFT W WO CONTRAST  Result Date: 06/22/2020 CLINICAL DATA:  Left leg pain, swelling, redness.  EXAM: MRI OF LOWER LEFT EXTREMITY WITHOUT AND WITH CONTRAST TECHNIQUE: Multiplanar, multisequence MR imaging of the left tibia and fibula was performed both before and after administration of intravenous contrast. CONTRAST:  27m GADAVIST GADOBUTROL 1 MMOL/ML IV SOLN COMPARISON:  X-ray 06/21/2020 FINDINGS: Technical note: Motion degraded examination. Extensive bone marrow edema centered at the left ankle and hindfoot with confluent low T1 marrow signal involving the calcaneus, talus, navicular, as well as the distal portions of the left tibia and fibula (series 7, images 16-25; series 14, images 71-81). Extensive bony destruction within the hindfoot. The midfoot is not well seen within the field of view, but is also likely involved. Soft tissue ulceration at the anteromedial aspect of the ankle with rim enhancing fluid collection extending to the tibiotalar and subtalar joints compatible with abscess and septic arthritis (series 34, images 19-25). Abscess measures up to 6.3 cm in maximal dimension. Additional small 1.0 cm abscess within Kager's fat (series 31, image 68). Diffuse soft tissue edema throughout the lower extremity. There is diffuse edema-like signal throughout the lower extremity musculature which may reflect myositis and/or denervation changes. Small amount of fluid tracks along the fascial margins of the lower extremity musculature within the calf. No rim enhancing fascial fluid collection. No intramuscular collection. IMPRESSION: 1. Extensive acute osteomyelitis centered at the  left ankle and hindfoot, as described above. 2. Soft tissue ulceration at the anteromedial aspect of the ankle with a 6.3 cm deep soft tissue abscess and associated septic arthritis of the tibiotalar and subtalar joints. 3. Diffuse edema-like signal throughout the lower extremity musculature which may reflect myositis and/or denervation changes. Electronically Signed   By: Davina Poke D.O.   On: 06/22/2020 08:10      Labs:   Basic Metabolic Panel: Recent Labs  Lab 06/24/20 0530 06/24/20 0530 06/25/20 0337 06/28/20 0134  NA 129*  --  134* 134*  K 4.8   < > 4.4 4.4  CL 99  --  103 101  CO2 21*  --  23 25  GLUCOSE 369*  --  245* 181*  BUN 25*  --  21* 17  CREATININE 1.14  --  1.06 0.94  CALCIUM 8.4*  --  8.4* 9.4  MG 1.9  --   --   --    < > = values in this interval not displayed.   GFR Estimated Creatinine Clearance: 128.2 mL/min (by C-G formula based on SCr of 0.94 mg/dL). Liver Function Tests: Recent Labs  Lab 06/24/20 0530  AST 9*  ALT 10  ALKPHOS 61  BILITOT <0.1*  PROT 7.1  ALBUMIN 2.0*   No results for input(s): LIPASE, AMYLASE in the last 168 hours. No results for input(s): AMMONIA in the last 168 hours. Coagulation profile No results for input(s): INR, PROTIME in the last 168 hours.  CBC: Recent Labs  Lab 06/24/20 0530 06/25/20 0337 06/28/20 0134  WBC 6.8 5.0 7.5  HGB 7.3* 7.7* 7.8*  HCT 23.5* 25.2* 25.4*  MCV 86.4 86.3 84.9  PLT 294 266 313   Cardiac Enzymes: No results for input(s): CKTOTAL, CKMB, CKMBINDEX, TROPONINI in the last 168 hours. BNP: Invalid input(s): POCBNP CBG: Recent Labs  Lab 06/29/20 0829 06/29/20 1155 06/29/20 1623 06/29/20 2048 06/30/20 0643  GLUCAP 127* 132* 176* 213* 157*   D-Dimer No results for input(s): DDIMER in the last 72 hours. Hgb A1c No results for input(s): HGBA1C in the last 72 hours. Lipid Profile No results for input(s): CHOL, HDL, LDLCALC, TRIG, CHOLHDL, LDLDIRECT in the last 72 hours. Thyroid function studies No results for input(s): TSH, T4TOTAL, T3FREE, THYROIDAB in the last 72 hours.  Invalid input(s): FREET3 Anemia work up No results for input(s): VITAMINB12, FOLATE, FERRITIN, TIBC, IRON, RETICCTPCT in the last 72 hours. Microbiology Recent Results (from the past 240 hour(s))  SARS Coronavirus 2 by RT PCR (hospital order, performed in [facility] hospital lab) Nasopharyngeal Nasopharyngeal Swab      Status: None   Collection Time: 06/21/20  2:09 PM   Specimen: Nasopharyngeal Swab  Result Value Ref Range Status   SARS Coronavirus 2 NEGATIVE NEGATIVE Final    Comment: (NOTE) SARS-CoV-2 target nucleic acids are NOT DETECTED.  The SARS-CoV-2 RNA is generally detectable in upper and lower respiratory specimens during the acute phase of infection. The lowest concentration of SARS-CoV-2 viral copies this assay can detect is 250 copies / mL. A negative result does not preclude SARS-CoV-2 infection and should not be used as the sole basis for treatment or other patient management decisions.  A negative result may occur with improper specimen collection / handling, submission of specimen other than nasopharyngeal swab, presence of viral mutation(s) within the areas targeted by this assay, and inadequate number of viral copies (<250 copies / mL). A negative result must be combined with clinical observations, patient history, and  epidemiological information.  Fact Sheet for Patients:   StrictlyIdeas.no  Fact Sheet for Healthcare Providers: BankingDealers.co.za  This test is not yet approved or  cleared by the [place] FDA and has been authorized for detection and/or diagnosis of SARS-CoV-2 by FDA under an Emergency Use Authorization (EUA).  This EUA will remain in effect (meaning this test can be used) for the duration of the COVID-19 declaration under Section 564(b)(1) of the Act, 21 U.S.C. section 360bbb-3(b)(1), unless the authorization is terminated or revoked sooner.  Performed at [facility], [address], [place], [place] [address]   Blood Culture (routine x 2)     Status: None   Collection Time: 06/21/20  2:16 PM   Specimen: Right Antecubital; Blood  Result Value Ref Range Status   Specimen Description   Final    RIGHT ANTECUBITAL BOTTLES DRAWN AEROBIC AND ANAEROBIC   Special Requests Blood Culture adequate volume   Final   Culture   Final    NO GROWTH 5 DAYS Performed at [facility], [address] [place], [place] [address]    Report Status 06/26/2020 FINAL  Final  Blood Culture (routine x 2)     Status: None   Collection Time: 06/21/20  2:16 PM   Specimen: Left Antecubital; Blood  Result Value Ref Range Status   Specimen Description   Final    LEFT ANTECUBITAL BOTTLES DRAWN AEROBIC AND ANAEROBIC   Special Requests Blood Culture adequate volume  Final   Culture   Final    NO GROWTH 5 DAYS Performed at [facility], [address], [place], [place] [address]    Report Status 06/26/2020 FINAL  Final  Urine culture     Status: Abnormal   Collection Time: 06/21/20  5:12 PM   Specimen: Urine, Catheterized  Result Value Ref Range Status   Specimen Description   Final    URINE, CATHETERIZED Performed at [facility] [address], [place], [place] [address]    Special Requests   Final    NONE Performed at [facility], [address] [place], [place] [address]    Culture (A)  Final    <10,000 COLONIES/mL INSIGNIFICANT GROWTH Performed at [facility], [place] [address] [place], [place] [address]    Report Status 06/22/2020 FINAL  Final  Surgical pcr screen     Status: Abnormal   Collection Time: 06/22/20  2:10 AM   Specimen: Nasal Mucosa; Nasal Swab  Result Value Ref Range Status   MRSA, PCR NEGATIVE NEGATIVE Final   Staphylococcus aureus POSITIVE (A) NEGATIVE Final    Comment: (NOTE) The Xpert SA Assay (FDA approved for NASAL specimens in patients 37 years of age and older), is one component of a comprehensive surveillance program. It is not intended to diagnose infection nor to guide or monitor treatment. Performed at [facility], [place] [address] [place], [place] [address]   Aerobic/Anaerobic Culture (surgical/deep wound)     Status: None   Collection Time: 06/22/20  7:44 PM   Specimen: Foot, Left; Wound  Result Value Ref Range Status   Specimen Description WOUND  LEFT FOOT  Final   Special Requests NONE  Final   Gram Stain   Final    RARE WBC PRESENT, PREDOMINANTLY PMN RARE GRAM POSITIVE COCCI    Culture   Final    FEW STAPHYLOCOCCUS AUREUS NO ANAEROBES ISOLATED Performed at [facility], [address], [place], [place] [address]    Report Status  06/27/2020 FINAL  Final   Organism ID, Bacteria STAPHYLOCOCCUS AUREUS  Final      Susceptibility   Staphylococcus aureus - MIC*    CIPROFLOXACIN <=0.5 SENSITIVE Sensitive     ERYTHROMYCIN >=8 RESISTANT Resistant     GENTAMICIN <=0.5 SENSITIVE Sensitive     OXACILLIN 0.5 SENSITIVE Sensitive     TETRACYCLINE >=16 RESISTANT Resistant     VANCOMYCIN <=0.5 SENSITIVE Sensitive     TRIMETH/SULFA <=10 SENSITIVE Sensitive     CLINDAMYCIN >=8 RESISTANT Resistant     RIFAMPIN <=0.5 SENSITIVE Sensitive     Inducible Clindamycin NEGATIVE Sensitive     * FEW STAPHYLOCOCCUS AUREUS  Fungus Culture With Stain     Status: None (Preliminary result)   Collection Time: 06/22/20  8:04 PM   Specimen: Bone; Tissue  Result Value Ref Range Status   Fungus Stain Final report  Final    Comment: (NOTE) Performed At: [facility] [address] [place], [place] [id number] Rush Farmer MD SH:[id number]    Fungus (Mycology) Culture PENDING  Incomplete   Fungal Source BONE  Final    Comment: TALUS Performed at [facility], [place] [address], [place], [place] [address]   Aerobic/Anaerobic Culture (surgical/deep wound)     Status: None   Collection Time: 06/22/20  8:04 PM   Specimen: Bone; Tissue  Result Value Ref Range Status   Specimen Description BONE  Final   Special Requests TALUS  Final   Gram Stain NO WBC SEEN NO ORGANISMS SEEN   Final   Culture   Final    RARE STAPHYLOCOCCUS AUREUS CRITICAL RESULT CALLED TO, READ BACK BY AND VERIFIED WITH: RN G.ANIRA AT 4128 ON 06/23/20 BY T.SAAD NO ANAEROBES ISOLATED Performed at [facility], [place] [address] [place], [place] [address]     Report Status 06/27/2020 FINAL  Final   Organism ID, Bacteria STAPHYLOCOCCUS AUREUS  Final      Susceptibility   Staphylococcus aureus - MIC*    CIPROFLOXACIN <=0.5 SENSITIVE Sensitive     ERYTHROMYCIN >=8 RESISTANT Resistant     GENTAMICIN <=0.5 SENSITIVE Sensitive     OXACILLIN 0.5 SENSITIVE Sensitive     TETRACYCLINE >=16 RESISTANT Resistant     VANCOMYCIN <=0.5 SENSITIVE Sensitive     TRIMETH/SULFA <=10 SENSITIVE Sensitive     CLINDAMYCIN >=8 RESISTANT Resistant     RIFAMPIN <=0.5 SENSITIVE Sensitive     Inducible Clindamycin NEGATIVE Sensitive     * RARE STAPHYLOCOCCUS AUREUS  Fungus Culture Result     Status: None   Collection Time: 06/22/20  8:04 PM  Result Value Ref Range Status   Result 1 Comment  Final    Comment: (NOTE) KOH/Calcofluor preparation:  no fungus observed. Performed At: [facility] [place], [place] [id number] Rush Farmer MD GE:[id number]      Discharge Instructions:   Discharge Instructions    Diet - low sodium heart healthy   Complete by: As directed    Diet Carb Modified   Complete by: As directed    Discharge wound care:   Complete by: As directed    Prevena vac   Increase activity slowly   Complete by: As directed    Negative Pressure Wound Therapy - Incisional   Complete by: As directed    Show patient how to attach prevena vac     Allergies as of 06/30/2020   No Known Allergies     Medication List    STOP taking  these medications   clindamycin 300 MG capsule Commonly known as: Cleocin   glimepiride 4 MG tablet Commonly known as: AMARYL   ibuprofen 400 MG tablet Commonly known as: ADVIL     TAKE these medications   acetaminophen 325 MG tablet Commonly known as: TYLENOL Take 2 tablets (650 mg total) by mouth every 6 (six) hours as needed for mild pain (or Fever >/= 101).   albuterol 108 (90 Base) MCG/ACT inhaler Commonly known as: VENTOLIN HFA Inhale 2 puffs into the lungs every 6 (six) hours as  needed for wheezing or shortness of breath.   ALPRAZolam 1 MG tablet Commonly known as: XANAX Take 0.5 tablets (0.5 mg total) by mouth 4 (four) times daily as needed for anxiety.   amitriptyline 100 MG tablet Commonly known as: ELAVIL Take 100 mg by mouth at bedtime.   atorvastatin 40 MG tablet Commonly known as: LIPITOR Take 40 mg by mouth every evening.   docusate sodium 100 MG capsule Commonly known as: COLACE Take 100 mg by mouth daily as needed for mild constipation.   DULoxetine 60 MG capsule Commonly known as: CYMBALTA Take 60 mg by mouth daily.   ferrous sulfate 325 (65 FE) MG tablet Take 1 tablet (325 mg total) by mouth daily.   furosemide 20 MG tablet Commonly known as: LASIX Take 20 mg by mouth daily as needed.   gabapentin 300 MG capsule Commonly known as: NEURONTIN Take 972m in the morning and at noon, then take 12031mat bedtime What changed:   how much to take  how to take this  when to take this  additional instructions   glycopyrrolate 1 MG tablet Commonly known as: ROBINUL Take 1 mg by mouth 2 (two) times daily.   hydrALAZINE 25 MG tablet Commonly known as: APRESOLINE Take 1 tablet (25 mg total) by mouth every 8 (eight) hours.   hydrOXYzine 25 MG tablet Commonly known as: ATARAX/VISTARIL Take 25 mg by mouth 4 (four) times daily as needed.   insulin aspart 100 UNIT/ML injection Commonly known as: novoLOG Inject 0-15 Units into the skin 3 (three) times daily with meals.   insulin aspart 100 UNIT/ML injection Commonly known as: novoLOG Inject 0-5 Units into the skin at bedtime.   insulin glargine 100 UNIT/ML injection Commonly known as: LANTUS Inject 0.1 mLs (10 Units total) into the skin daily. Start taking on: July 01, 2020   lisinopril 40 MG tablet Commonly known as: ZESTRIL Take 1 tablet (40 mg total) by mouth at bedtime. What changed:   medication strength  how much to take  when to take this   morphine 30 MG 12 hr  tablet Commonly known as: MS CONTIN Take 30 mg by mouth every 12 (twelve) hours.   omeprazole 20 MG capsule Commonly known as: PRILOSEC TAKE 1 CAPSULE WITH BREAKFAST AND SUPPER. What changed: See the new instructions.   oxycodone 30 MG immediate release tablet Commonly known as: ROXICODONE Take 30 mg by mouth 4 (four) times daily as needed.   primidone 50 MG tablet Commonly known as: MYSOLINE Take 1 tablet (50 mg total) by mouth 2 (two) times daily.   Voltaren 1 % Gel Generic drug: diclofenac Sodium Apply 1 application topically daily as needed (pain).            Discharge Care Instructions  (From admission, onward)         Start     Ordered   06/30/20 0000  Discharge wound care:  CommentsFelecia Jan   06/30/20 0913          Follow-up Information    Suzan Slick, NP.   Specialty: Orthopedic Surgery Contact information: [place] [address] [phone]        Follow up In 1 week.        [name], PA-C Follow up in 1 week(s).   Specialties: Physician Assistant, Internal Medicine Contact information: [place] [address] [phone]                Time coordinating discharge: 35 min  Signed:  Geradine Girt DO  [facility] 06/30/2020, 9:13 AM

## 2020-06-30 NOTE — Progress Notes (Signed)
Inpatient Rehab Admissions Coordinator:   I have insurance authorization and a bed available for pt to admit to CIR today.  Dr. Eliseo Squires in agreement.  I've let Mia [place], CM, know, and left a voicemail for pt's sister, [name], to let her know.    Shann Medal, PT, DPT Admissions Coordinator [phone] 06/30/20  10:29 AM

## 2020-06-30 NOTE — Progress Notes (Signed)
Pt transferred to 4W-[id number].

## 2020-06-30 NOTE — Progress Notes (Addendum)
Inpatient Rehabilitation Medication Review by a Pharmacist  A complete drug regimen review was completed for this patient to identify any potential clinically significant medication issues.  Clinically significant medication issues were identified: Yes   Type of Medication Issue Identified Description of Issue Urgent (address now) Non-Urgent (address on AM team rounds) Plan Plan Accepted by Provider? (Yes / No / Pending AM Rounds)  Drug Interaction(s) (clinically significant)       Duplicate Therapy       Allergy       No Medication Administration End Date       Incorrect Dose       Additional Drug Therapy Needed       Other  Discharge summary instructed pt to discontinue glimepiride; this med was ordered on admission to CIR Discharge summary listed Voltaren gel PRN; this med was not ordered on admission to CIR Pt's home med list on [facility] to [facility] included hydroxyzine PRN; this med was not ordered at [facility] or on admission to CIR Non urgent CIR team to review on AM rounds Pending AM rounds    Name of provider notified for urgent issues identified:  N/A  For non-urgent medication issues to be resolved on team rounds tomorrow morning a [facility] Secure Chat Handoff was sent to:  N/A; pt admitted after 5 PM  Time spent performing this drug regimen review (minutes): [place], PharmD, BCPS, [facility] Clinical Pharmacist 06/30/2020 6:37 PM

## 2020-06-30 NOTE — H&P (Signed)
Physical Medicine and Rehabilitation Admission H&P    Chief Complaint  Patient presents with  . 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  He was started on broad-spectrum antibiotics and was found to have acute osteomyelitis left ankle and hindfoot with septic arthritis as well as abscess.  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 Sharol Given.  Postop completed IV antibiotic course on 08/28 and acute blood loss anemia treated with 1 unit PRBC.  Has been on CIWA protocol without signs of alcohol withdrawal.  Therapy ongoing and CIR recommended due to functional decline.   Pt reports pain is a constant heart throbbing feeling- also has phantom pain in LLE Also describes shooting pain.   Urinating OK Doesn't think gabapentin helps much- on high dose LBM yesterday- denies constipation  Review of Systems  Constitutional: Negative for chills and fever.  HENT: Negative for hearing loss and tinnitus.   Respiratory: Positive for shortness of breath. Negative for cough.   Cardiovascular: Positive for leg swelling. Negative for chest pain.  Gastrointestinal: Negative for constipation.  Genitourinary: Negative for dysuria and urgency.  Skin: Negative for itching and rash.  Neurological: Positive for tremors (shakes at times), sensory change (hands tingle. RLE numb to mid shin) and weakness. Negative for dizziness and headaches.  Psychiatric/Behavioral: The patient is nervous/anxious and has insomnia (sleeps on and off).   All other systems reviewed and are negative.     Past Medical History:  Diagnosis Date  . Alcohol abuse   . Alcoholic peripheral  neuropathy ([place])   . Anxiety   . Arthritis   . Asthma    followed by pcp  . B12 deficiency   . Depression   . Diabetic neuropathy ([place])   . Edema of both lower extremities   . Essential tremor    neurologist-- dr patel--- due to alcohol abuse  . GERD (gastroesophageal reflux disease)   . History of cellulitis 2020   left lower leg;   recurrent 03-25-2020  . History of esophageal stricture    s/p  dilatation 02-13-2018  . History of osteomyelitis 2020   left great toe    . Hypercholesterolemia   . Hypertension    followed by pcp  (nuclear stress test 03-11-2014 low risk w/ no ischemia, ef 65%  . Normocytic anemia    followed by pcp   (03-18-2020 had transfusion's 02/ 2021)  . Peripheral vascular disease ([place])   . 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 Thrombocytopenia ([facility])    chronic  . Type 2 diabetes mellitus ([place])    followed by pcp---    (03-18-2020  pt stated does not check blood sugar)    Past Surgical History:  Procedure Laterality Date  . AMPUTATION Left 10/16/2019   Procedure: Left partial second ray resection; placement of antibiotic beads;  Surgeon: Evelina Bucy, DPM;  Location: [facility];  Service: Podiatry;  Laterality: Left;  . AMPUTATION Left 11/13/2019   Procedure: Left Midfoot Amputation - Transmetatarsal vs. Lisfranc; Placement antibiotic beads;  Surgeon: Evelina Bucy, DPM;  Location: [facility];  Service: Podiatry;  Laterality: Left;  . AMPUTATION Left 12/21/2019   Procedure: Chopart Amputation left foot;  Surgeon: Evelina Bucy, DPM;  Location: [facility];  Service: Podiatry;  Laterality: Left;  . AMPUTATION Left 06/24/2020   Procedure: LEFT BELOW KNEE AMPUTATION;  Surgeon: Newt Minion, MD;   Location: [place];  Service: Orthopedics;  Laterality: Left;  . AMPUTATION TOE Left 08/14/2019   Procedure: AMPUTATION GREAT TOE;  Surgeon: Evelina Bucy, DPM;  Location: [facility];  Service: Podiatry;  Laterality: Left;  . APPLICATION OF WOUND VAC Left 12/21/2019   Procedure: Application Of Wound Vac;  Surgeon: Evelina Bucy, DPM;  Location: [place];  Service: Podiatry;  Laterality: Left;  . BIOPSY  02/13/2018   Procedure: BIOPSY;  Surgeon: Danie Binder, MD;  [facility];  Service: Endoscopy;;  transverse colon biopsy, gastric biopsy  . COLONOSCOPY WITH PROPOFOL N/A 02/13/2018   Procedure: COLONOSCOPY WITH PROPOFOL;  Surgeon: Danie Binder, MD;  [facility];  Service: Endoscopy;  Laterality: N/A;  11:15am  . ESOPHAGOGASTRODUODENOSCOPY (EGD) WITH PROPOFOL N/A 02/13/2018   Procedure: ESOPHAGOGASTRODUODENOSCOPY (EGD) WITH PROPOFOL;  Surgeon: Danie Binder, MD;  [facility];  Service: Endoscopy;  Laterality: N/A;  . I & D EXTREMITY Left 07/16/2019   Procedure: Insicion  AND DEBRIDEMENT EXTREMITY;  Surgeon: Evelina Bucy, DPM;  Location: [place];  Service: Podiatry;  Laterality: Left;  . I & D EXTREMITY Left 06/22/2020   Procedure: IRRIGATION AND DEBRIDEMENT EXTREMITY, left foot and ankle;  Surgeon: Evelina Bucy, DPM;  Location: [place];  Service: Podiatry;  Laterality: Left;  . INCISION AND DRAINAGE OF WOUND Left 10/13/2019   Procedure: IRRIGATION AND DEBRIDEMENT WOUND OF LEFT FOOT AND FIRST METATARSAL RESECTION;  Surgeon: Evelina Bucy, DPM;  [facility];  Service: Podiatry;  Laterality: Left;  . IRRIGATION AND DEBRIDEMENT FOOT Left 11/11/2019   Procedure: Left Foot Wound Irrigation and Debridement;  Surgeon: Evelina Bucy, DPM;  [facility];  Service: Podiatry;  Laterality: Left;  . Left arm     Left arm repair (tendon and artery)  . PERCUTANEOUS PINNING  07/16/2019   Procedure: Open Reduction Percutaneous Pinning Extremity;  Surgeon: Evelina Bucy, DPM;  Location: [place];  Service: Podiatry;;  . PILONIDAL CYST EXCISION N/A 08/08/2014   Procedure: CYST EXCISION PILONIDAL EXTENSIVE;  Surgeon: Jamesetta So, MD;  [facility];  Service: General;  Laterality: N/A;  . POLYPECTOMY  02/13/2018   Procedure: POLYPECTOMY;  Surgeon: Danie Binder, MD;  [facility];  Service: Endoscopy;;  transverse colon polyp hs, rectal polyps times 2  . SAVORY DILATION N/A 02/13/2018   Procedure: SAVORY DILATION;  Surgeon: Danie Binder, MD;  [facility];  Service: Endoscopy;  Laterality: N/A;  . WOUND DEBRIDEMENT Left 09/04/2019   Procedure: DEBRIDEMENT WOUND WITH COMPLEX  REPAIR OF DEHISCENCE;  Surgeon: Evelina Bucy, DPM;  Location: [place];  Service: Podiatry;  Laterality: Left;  Leave patient on stretcher  . WOUND DEBRIDEMENT Left 10/16/2019   Procedure: Left foot wound debridement and closure;  Surgeon: Evelina Bucy, DPM;  [facility];  Service: Podiatry;  Laterality: Left;  . WOUND DEBRIDEMENT Left 12/18/2019  Procedure: LEFT FOOT DEBRIDEMENT WITH PARTIAL INCISION OF INFECTED BONE;  Surgeon: Evelina Bucy, DPM;  Location: [place];  Service: Podiatry;  Laterality: Left;  LEFT FOOT DEBRIDEMENT WITH PARTIAL INCISION OF INFECTED BONE    Family History  Problem Relation Age of Onset  . Heart attack Mother        Living, 75  . Cancer Father        Deceased  . Healthy Brother   . Healthy Sister   . Colon cancer Neg Hx   . Colon polyps Neg Hx     Social History:   Lives alone. Sister helps with home management. Has meals on wheels. He reports that he quit smoking about 35 years ago. His smoking use included cigarettes--for 2.5 PPD for 4-5 years. He has never used smokeless tobacco. He reports current alcohol use--6 to 8 beers per day/trying to lay off liquor.   He reports a hit of Marijuana occasionally.    Allergies: No Known Allergies    Medications Prior to Admission  Medication  Sig Dispense Refill  . acetaminophen (TYLENOL) 325 MG tablet Take 2 tablets (650 mg total) by mouth every 6 (six) hours as needed for mild pain (or Fever >/= 101). (Patient not taking: Reported on 06/21/2020) 20 tablet 0  . albuterol (PROVENTIL HFA;VENTOLIN HFA) 108 (90 Base) MCG/ACT inhaler Inhale 2 puffs into the lungs every 6 (six) hours as needed for wheezing or shortness of breath. 1 Inhaler 2  . ALPRAZolam (XANAX) 1 MG tablet Take 0.5 tablets (0.5 mg total) by mouth 4 (four) times daily as needed for anxiety. 12 tablet 0  . amitriptyline (ELAVIL) 100 MG tablet Take 100 mg by mouth at bedtime.    Marland Kitchen atorvastatin (LIPITOR) 40 MG tablet Take 40 mg by mouth every evening.   10  . diclofenac Sodium (VOLTAREN) 1 % GEL Apply 1 application topically daily as needed (pain).    Marland Kitchen docusate sodium (COLACE) 100 MG capsule Take 100 mg by mouth daily as needed for mild constipation.    . DULoxetine (CYMBALTA) 60 MG capsule Take 60 mg by mouth daily.    . ferrous sulfate 325 (65 FE) MG tablet Take 1 tablet (325 mg total) by mouth daily. (Patient taking differently: Take 325 mg by mouth daily. ) 30 tablet 1  . furosemide (LASIX) 20 MG tablet Take 20 mg by mouth daily as needed.    . gabapentin (NEURONTIN) 300 MG capsule Take 900mg  in the morning and at noon, then take 1200mg  at bedtime (Patient taking differently: Take 900-1,200 mg by mouth See admin instructions. Take 3 capsules (900 mg) by mouth every morning and at noon, take 4 capsules (1200 mg) at bedtime) 300 capsule 11  . glycopyrrolate (ROBINUL) 1 MG tablet Take 1 mg by mouth 2 (two) times daily.   2  . hydrALAZINE (APRESOLINE) 25 MG tablet Take 1 tablet (25 mg total) by mouth every 8 (eight) hours.    . hydrOXYzine (ATARAX/VISTARIL) 25 MG tablet Take 25 mg by mouth 4 (four) times daily as needed.    . insulin aspart (NOVOLOG) 100 UNIT/ML injection Inject 0-15 Units into the skin 3 (three) times daily with meals. 10 mL 11  . insulin aspart (NOVOLOG) 100  UNIT/ML injection Inject 0-5 Units into the skin at bedtime. 10 mL 11  . [START ON 07/01/2020] insulin glargine (LANTUS) 100 UNIT/ML injection Inject 0.1 mLs (10 Units total) into the skin daily. 10 mL 11  . lisinopril (ZESTRIL) 40 MG  tablet Take 1 tablet (40 mg total) by mouth at bedtime.    Marland Kitchen morphine (MS CONTIN) 30 MG 12 hr tablet Take 30 mg by mouth every 12 (twelve) hours.     Marland Kitchen omeprazole (PRILOSEC) 20 MG capsule TAKE 1 CAPSULE WITH BREAKFAST AND SUPPER. (Patient taking differently: Take 20 mg by mouth 2 (two) times daily before a meal. ) 60 capsule 5  . oxycodone (ROXICODONE) 30 MG immediate release tablet Take 30 mg by mouth 4 (four) times daily as needed.    . primidone (MYSOLINE) 50 MG tablet Take 1 tablet (50 mg total) by mouth 2 (two) times daily. 60 tablet 11    Drug Regimen Review  Drug regimen was reviewed and remains appropriate with no significant issues identified  Home: Home Living Family/patient expects to be discharged to:: Private residence Living Arrangements: Alone   Functional History:    Functional Status:  Mobility:          ADL:    Cognition: Cognition Orientation Level: Oriented X4     Blood pressure (!) 167/94, pulse 81, temperature 97.9 F (36.6 C), temperature source Oral, resp. rate 17, height 6\' 2"  (1.88 m), weight 132 kg, SpO2 99 %. Physical Exam Vitals and nursing note reviewed.  Constitutional:      Comments: Up in chair. NAD  Sitting up in bedside chair; appropriate, slow answering and processing, NAD  HENT:     Head: Normocephalic and atraumatic.     Comments: Smile equal    Right Ear: External ear normal.     Left Ear: External ear normal.     Nose: Nose normal. No congestion.     Mouth/Throat:     Mouth: Mucous membranes are moist.     Pharynx: Oropharynx is clear. No oropharyngeal exudate.  Eyes:     General:        Right eye: No discharge.        Left eye: No discharge.     Extraocular Movements: Extraocular movements  intact.  Cardiovascular:     Comments: RRR- no M/R/G Pulmonary:     Comments: CTA B/L- no W/R/R- good air movement Abdominal:     General: There is distension.     Comments: abd protuberant; soft, NT, somewhat distended; (+) BS  Musculoskeletal:        General: Swelling (2+ RLE) present.     Cervical back: Normal range of motion and neck supple.     Comments: RLE with small healed abrasions. Callused laceration plantar surface under great toe. Small dark ulcer on third toe and on heel. Limb guard in place on L-BKA with incisional wound VAC.   VAC on L BKA with limb guard in place UEs 5/5 except L finger abd which is not testable due to 5th digit contracture LEs- RLE- HF 4-/5, KE/KF 5-/5; DF and PF 5/5 LLE- at least 3/5- too painful to test against resistance  Skin:    Comments: IV L forearm and R forearm- OK Little abrasions R calf and R knee \\wound  VAC on LLE  Neurological:     Mental Status: He is oriented to person, place, and time.     Comments: Sensation to light touch intact except stocking AND glove pattern B/L Ox3  Psychiatric:        Mood and Affect: Mood normal.        Behavior: Behavior normal.     Results for orders placed or performed during the hospital encounter of 06/21/20 (from the past  48 hour(s))  Glucose, capillary     Status: Abnormal   Collection Time: 06/28/20  9:21 PM  Result Value Ref Range   Glucose-Capillary 207 (H) 70 - 99 mg/dL    Comment: Glucose reference range applies only to samples taken after fasting for at least 8 hours.  Glucose, capillary     Status: Abnormal   Collection Time: 06/29/20  6:34 AM  Result Value Ref Range   Glucose-Capillary 129 (H) 70 - 99 mg/dL    Comment: Glucose reference range applies only to samples taken after fasting for at least 8 hours.  Glucose, capillary     Status: Abnormal   Collection Time: 06/29/20  8:29 AM  Result Value Ref Range   Glucose-Capillary 127 (H) 70 - 99 mg/dL    Comment: Glucose reference  range applies only to samples taken after fasting for at least 8 hours.  Glucose, capillary     Status: Abnormal   Collection Time: 06/29/20 11:55 AM  Result Value Ref Range   Glucose-Capillary 132 (H) 70 - 99 mg/dL    Comment: Glucose reference range applies only to samples taken after fasting for at least 8 hours.  Glucose, capillary     Status: Abnormal   Collection Time: 06/29/20  4:23 PM  Result Value Ref Range   Glucose-Capillary 176 (H) 70 - 99 mg/dL    Comment: Glucose reference range applies only to samples taken after fasting for at least 8 hours.  Glucose, capillary     Status: Abnormal   Collection Time: 06/29/20  8:48 PM  Result Value Ref Range   Glucose-Capillary 213 (H) 70 - 99 mg/dL    Comment: Glucose reference range applies only to samples taken after fasting for at least 8 hours.  Glucose, capillary     Status: Abnormal   Collection Time: 06/30/20  6:43 AM  Result Value Ref Range   Glucose-Capillary 157 (H) 70 - 99 mg/dL    Comment: Glucose reference range applies only to samples taken after fasting for at least 8 hours.  Glucose, capillary     Status: Abnormal   Collection Time: 06/30/20 12:18 PM  Result Value Ref Range   Glucose-Capillary 151 (H) 70 - 99 mg/dL    Comment: Glucose reference range applies only to samples taken after fasting for at least 8 hours.  Glucose, capillary     Status: Abnormal   Collection Time: 06/30/20  5:04 PM  Result Value Ref Range   Glucose-Capillary 211 (H) 70 - 99 mg/dL    Comment: Glucose reference range applies only to samples taken after fasting for at least 8 hours.   No results found.     Medical Problem List and Plan: 1.  L BKA and impaired function secondary to LLE osteomyelitis with impaired gait- has Wound VAC on LLE  -patient may not shower until wound VAC is removed  -ELOS/Goals: 10-14 days- mod I to supervision 2.  Antithrombotics: -DVT/anticoagulation:  Pharmaceutical: Lovenox  -antiplatelet therapy: N/a 3.  Chronic pain/Pain Management: PDMP reviewed--PTA on Morphine ER 30 mg bid/Oxycodone 30 mg qid and Xanax 1 mg qid 4. Mood: LCSW to follow for evaluation and support.   -antipsychotic agents: N/A 5. Neuropsych: This patient is capable of making decisions on his own behalf. 6. Skin/Wound Care: Monitor wound for healing. Add protein supplement to promote healing.  7. Fluids/Electrolytes/Nutrition: Monitor I/O. Check lytes in am.  8. HTN: Monitor BP tid--continue lasix, hydralazine TID and lisinopril.  9. Acute on chronic anemia:  S/p 1 unit PRBC 8/24--> hgb 7.8 and stable. Resume iron supplement.  10. T2DM: Hgb A1c- 4.7 on Amaryl 4 mg po PTA. Will resume and titrate as indicated. Continue to monitor BS ac/hs and use SSI as needed. Will  d/c lantus tomorrow.   11. Diabetic peripheral neuropathy: On elavil 100 mg, cymbalta, gabapentin (900/900/1200) 12. ETOH abuse/B 12 deficiency: On thiamine and folic acid. Add vitamin B 12.  13. Hyponatremia/Pre-renal azotemia: Improving. Continue to follow with serial checks.     Ivan Anchors Love, PA-C   I have personally performed a face to face diagnostic evaluation of this patient and formulated the key components of the plan.  Additionally, I have personally reviewed laboratory data, imaging studies, as well as relevant notes and concur with the physician assistant's documentation above.   The patient's status has not changed from the original H&P.  Any changes in documentation from the acute care chart have been noted above.     Courtney Heys, MD 06/30/2020

## 2020-06-30 NOTE — Progress Notes (Signed)
Pt had an assisted fall;loss his balance when going back from recliner chair to bed.Vital signs stable;Pt said he's not in pain; Attending MD made aware with no new orders; Asst DON & Charge RN were made aware too.

## 2020-06-30 NOTE — Progress Notes (Signed)
Occupational Therapy Treatment Patient Details Name: Eugene Diaz MRN: [id number] DOB: May 13, 1966 Today's Date: 06/30/2020    History of present illness Eugene Diaz is an 54 y.o. male with medical history significant for left foot osteomyelitis and amputation, diabetes mellitus, hypertension, alcoholism.  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   OT comments  Pt. Was seen for skilled OT to maximize I and safety with ADLs and mobility. Pt. Worked on ADLs sitting EOB. Pt. Is planning on rehab prior to dc home.   Follow Up Recommendations  CIR    Equipment Recommendations  3 in 1 bedside commode;Wheelchair (measurements OT);Tub/shower seat    Recommendations for Other Services      Precautions / Restrictions Precautions Precautions: Fall Required Braces or Orthoses: Other Brace Other Brace: Limb protector Restrictions Weight Bearing Restrictions: Yes LLE Weight Bearing: Non weight bearing       Mobility Bed Mobility Overal bed mobility: Needs Assistance Bed Mobility: Supine to Sit     Supine to sit: Supervision     General bed mobility comments: Pt seated in recliner.  Transfers Overall transfer level: Needs assistance Equipment used: Rolling walker (2 wheeled)   Sit to Stand: Min assist;+2 safety/equipment Stand pivot transfers: Min assist;+2 physical assistance;+2 safety/equipment            Balance     Sitting balance-Leahy Scale: Good       Standing balance-Leahy Scale: Fair                             ADL either performed or assessed with clinical judgement   ADL   Eating/Feeding: Independent   Grooming: Set up   Upper Body Bathing: Set up;Supervision/ safety   Lower Body Bathing: Minimal assistance;Sitting/lateral leans   Upper Body Dressing : Set up;Sitting   Lower Body Dressing: Supervision/safety;Set up;Moderate assistance;Sitting/lateral leans   Toilet Transfer: Minimal  assistance;+2 for physical assistance;+2 for safety/equipment;Stand-pivot Toilet Transfer Details (indicate cue type and reason): VC's for RW management Toileting- Clothing Manipulation and Hygiene: Moderate assistance;Sitting/lateral lean       Functional mobility during ADLs: +2 for physical assistance;Rolling walker General ADL Comments: Pt. with good balance while siting EOB.      Vision   Vision Assessment?: No apparent visual deficits   Perception     Praxis      Cognition Arousal/Alertness: Awake/alert Behavior During Therapy: WFL for tasks assessed/performed Overall Cognitive Status: Within Functional Limits for tasks assessed                                          Exercises     Shoulder Instructions       General Comments      Pertinent Vitals/ Pain       Pain Assessment: 0-10 Pain Score: 6  Pain Location: L LE Pain Descriptors / Indicators: Throbbing Pain Intervention(s): Monitored during session;Patient requesting pain meds-RN notified  Home Living                                          Prior Functioning/Environment              Frequency  Min 2X/week  Progress Toward Goals  OT Goals(current goals can now be found in the care plan section)  Progress towards OT goals: Progressing toward goals  Acute Rehab OT Goals Patient Stated Goal: agreeable to rehab in hospital OT Goal Formulation: With patient Time For Goal Achievement: 07/03/20 Potential to Achieve Goals: Good ADL Goals Pt Will Perform Grooming: with set-up;with supervision;sitting Pt Will Perform Upper Body Bathing: with set-up;with supervision;sitting Pt Will Perform Lower Body Bathing: with set-up;with supervision;with min assist;sitting/lateral leans;bed level Pt Will Perform Upper Body Dressing: with set-up;with supervision;sitting Pt Will Perform Lower Body Dressing: with set-up;with supervision;with min assist;sitting/lateral  leans;bed level Pt Will Transfer to Toilet: with mod assist;stand pivot transfer Pt/caregiver will Perform Home Exercise Program: With Supervision;Both right and left upper extremity;With theraband  Plan Discharge plan remains appropriate    Co-evaluation                 AM-PAC OT "6 Clicks" Daily Activity     Outcome Measure   Help from another person eating meals?: None Help from another person taking care of personal grooming?: A Little Help from another person toileting, which includes using toliet, bedpan, or urinal?: A Lot Help from another person bathing (including washing, rinsing, drying)?: A Lot Help from another person to put on and taking off regular upper body clothing?: A Little Help from another person to put on and taking off regular lower body clothing?: A Lot 6 Click Score: 16    End of Session Equipment Utilized During Treatment: Gait belt;Rolling walker  OT Visit Diagnosis: Unsteadiness on feet (R26.81);Pain   Activity Tolerance Patient tolerated treatment well   Patient Left in chair;with call bell/phone within reach;with chair alarm set   Nurse Communication Patient requests pain meds        Time: 8841-6606 OT Time Calculation (min): 41 min  Charges: OT General Charges $OT Visit: 1 Visit OT Treatments $Self Care/Home Management : 38-52 mins  Reece Packer OT/L    Jadyn Brasher 06/30/2020, 11:20 AM

## 2020-06-30 NOTE — Progress Notes (Signed)
Courtney Heys, MD  Physician  Physical Medicine and Rehabilitation  PMR Pre-admission     Signed  Date of Service:  06/27/2020 2:17 PM      Related encounter: ED to Hosp-Admission (Current) from 06/21/2020 in [place]       Show:Clear all _0 Manual_1 Template_2 Copied  Added by: _3 Lind Covert, Runell Gess, CCC-SLP_4 Lovorn, Megan, MD_5 Michel Santee, PT  _6 Hover for details PMR Admission Coordinator Pre-Admission Assessment  Patient: Eugene Diaz is an 54 y.o., male MRN: [id number] DOB: 23-Apr-1966 Height: _7  (188 cm) Weight: 132 kg  Insurance Information HMO:     PPO:  Yes    PCP:      IPA:      80/20:      OTHER:  PRIMARY: [place] Medicaid [employer]      Policy#: [id number]      Subscriber: patient CM Name:       Phone#:      Fax#:  Pre-Cert#: [id number]      Employer:  Benefits: eligibility verified by Maryclare Bean Phone #: [phone]     Name:  Eff. Date: 04/30/20-12/31-21     Deduct: $0      Out of Pocket Max: $0      Life Max:  CIR: $0      SNF: $0 Outpatient:      Co-Pay: $3 Home Health: $0 co-pay; limited by medical necessity review      Co-Pay:  DME: $0 co-pay; limited by medical necessity review     Co-Pay:  Providers: Per Medicaid guidelines SECONDARY:       Policy#:      Phone#:   Development worker, community:       Phone#:   The Engineer, petroleum" for patients in Inpatient Rehabilitation Facilities with attached "Privacy Act [place] Records" was provided and verbally reviewed with: N/A  Emergency Contact Information         Contact Information    Name Relation Home Work Mobile   Krishiv, Sandler Mother [phone]     [facility] Sister   [phone]   [facility] Sister [phone]  [phone]      Current Medical History  Patient Admitting Diagnosis: s/p Left BKA History of Present Illness: Pt is a 54 y/o male with PMH of DM, anxiety, neuropathy (DM  and ETOH), and ETOH abuse admitted to [facility] on 8/22 with c/o increasing LLE redness, swelling, pain, and drainage.  Reports being treated with antibiotics per podiatrist but symptoms ongoing.  Reports multiple falls, increasing weakness.  On admission, pt met sepsis criteria with fever, tachycardia, and +source of infection with osteomyelitis.  He was placed on broad spectrum abx through 8/28.  MRI showed acute osteomyelitis of L ankle and hindfoot and abscess of soft tissues with septic arthritis.  Pt underwent I&D with podiatry on 8/23 and BKA per Dr. Sharol Given on 8/25.  Post op course ABLA with pt receiving 1 unit PRBCs on 8/24.  Therapy evaluations completed and pt was recommended for CIR.    Patient's medical record from [facility] has been reviewed by the rehabilitation admission coordinator and physician.  Past Medical History      Past Medical History:  Diagnosis Date  . Alcohol abuse   . Alcoholic peripheral neuropathy ([place])   . Anxiety   . Arthritis   . Asthma    followed by pcp  . B12 deficiency   . Depression   .  Diabetic neuropathy ([place])   . Edema of both lower extremities   . Essential tremor    neurologist-- dr patel--- due to alcohol abuse  . GERD (gastroesophageal reflux disease)   . History of cellulitis 2020   left lower leg;   recurrent 03-25-2020  . History of esophageal stricture    s/p  dilatation 02-13-2018  . History of osteomyelitis 2020   left great toe    . Hypercholesterolemia   . Hypertension    followed by pcp  (nuclear stress test 03-11-2014 low risk w/ no ischemia, ef 65%  . Normocytic anemia    followed by pcp   (03-18-2020 had transfusion's 02/ 2021)  . Peripheral vascular disease ([place])   . 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 Thrombocytopenia (HCC)    chronic  . Type 2 diabetes mellitus ([place])    followed by pcp---    (03-18-2020  pt stated does not check blood sugar)    Family History   family history includes Cancer in his father; Healthy in his brother and sister; Heart attack in his mother.  Prior Rehab/Hospitalizations Has the patient had prior rehab or hospitalizations prior to admission? Yes  Has the patient had major surgery during 100 days prior to admission? Yes             Current Medications  Current Facility-Administered Medications:  .  acetaminophen (TYLENOL) tablet 650 mg, 650 mg, Oral, Q6H PRN **OR** acetaminophen (TYLENOL) suppository 650 mg, 650 mg, Rectal, Q6H PRN, Persons, Bevely Palmer, PA .  albuterol (PROVENTIL) (2.5 MG/3ML) 0.083% nebulizer solution 3 mL, 3 mL, Inhalation, Q6H PRN, [name], Jessica U, DO, 3 mL at 06/25/20 0956 .  ALPRAZolam Duanne Moron) tablet 0.5 mg, 0.5 mg, Oral, QID PRN, Persons, Bevely Palmer, PA, 0.5 mg at 06/29/20 2219 .  amitriptyline (ELAVIL) tablet 100 mg, 100 mg, Oral, QHS, Persons, Bevely Palmer, PA, 100 mg at 06/29/20 2220 .  atorvastatin (LIPITOR) tablet 40 mg, 40 mg, Oral, QPM, Persons, Bevely Palmer, PA, 40 mg at 06/29/20 1733 .  docusate sodium (COLACE) capsule 100 mg, 100 mg, Oral, BID, Persons, Bevely Palmer, PA, 100 mg at 06/30/20 0845 .  DULoxetine (CYMBALTA) DR capsule 60 mg, 60 mg, Oral, Daily, Persons, Bevely Palmer, [place], 60 mg at 06/30/20 0845 .  enoxaparin (LOVENOX) injection 65 mg, 65 mg, Subcutaneous, Q24H, Persons, Bevely Palmer, PA, 65 mg at 06/30/20 0847 .  folic acid (FOLVITE) tablet 1 mg, 1 mg, Oral, Daily, Persons, Bevely Palmer, [place], 1 mg at 06/30/20 0845 .  furosemide (LASIX) tablet 20 mg, 20 mg, Oral, Daily, Persons, Bevely Palmer, PA, 20 mg at 06/30/20 0845 .  gabapentin (NEURONTIN) capsule 1,200 mg, 1,200 mg, Oral, QHS, [name], Jessica U, DO, 1,200 mg at 06/29/20 2221 .   gabapentin (NEURONTIN) capsule 900 mg, 900 mg, Oral, BID, Eulogio Bear U, DO, 900 mg at 06/30/20 4166 .  glycopyrrolate (ROBINUL) tablet 1 mg, 1 mg, Oral, BID, Persons, Bevely Palmer, PA, 1 mg at 06/30/20 0848 .  hydrALAZINE (APRESOLINE) tablet 25 mg, 25 mg, Oral, Q8H, [name], Jessica U, DO, 25 mg at 06/30/20 0714 .  insulin aspart (novoLOG) injection 0-15 Units, 0-15 Units, Subcutaneous, TID WC,  Persons, Bevely Palmer, [place], 3 Units at 06/30/20 [phone] .  insulin aspart (novoLOG) injection 0-5 Units, 0-5 Units, Subcutaneous, QHS, Persons, Bevely Palmer, [place], 2 Units at 06/29/20 2228 .  insulin glargine (LANTUS) injection 10 Units, 10 Units, Subcutaneous, Daily, Eulogio Bear U, DO, 10 Units at 06/30/20 0849 .  lisinopril (ZESTRIL) tablet 40 mg, 40 mg, Oral, QHS, [name], Jessica U, DO, 40 mg at 06/29/20 2223 .  metoCLOPramide (REGLAN) tablet 5-10 mg, 5-10 mg, Oral, Q8H PRN **OR** metoCLOPramide (REGLAN) injection 5-10 mg, 5-10 mg, Intravenous, Q8H PRN, Persons, Bevely Palmer, PA .  morphine (MS CONTIN) 12 hr tablet 30 mg, 30 mg, Oral, Q12H, Persons, Bevely Palmer, PA, 30 mg at 06/30/20 0850 .  multivitamin with minerals tablet 1 tablet, 1 tablet, Oral, Daily, Persons, Bevely Palmer, [place], 1 tablet at 06/30/20 0846 .  ondansetron (ZOFRAN) tablet 4 mg, 4 mg, Oral, Q6H PRN **OR** ondansetron (ZOFRAN) injection 4 mg, 4 mg, Intravenous, Q6H PRN, Persons, Bevely Palmer, PA .  oxyCODONE (Oxy IR/ROXICODONE) immediate release tablet 30 mg, 30 mg, Oral, Q4H PRN, Persons, Bevely Palmer, PA, 30 mg at 06/29/20 2218 .  pantoprazole (PROTONIX) EC tablet 40 mg, 40 mg, Oral, Daily, Persons, Bevely Palmer, [place], 40 mg at 06/30/20 0844 .  polyethylene glycol (MIRALAX / GLYCOLAX) packet 17 g, 17 g, Oral, Daily PRN, Persons, Bevely Palmer, PA .  primidone (MYSOLINE) tablet 50 mg, 50 mg, Oral, BID, Persons, Bevely Palmer, [place], 50 mg at 06/30/20 0848 .  thiamine tablet 100 mg, 100 mg, Oral, Daily, 100 mg at 06/30/20 0845 **OR** thiamine (B-1) injection 100 mg, 100 mg, Intravenous,  Daily, Persons, Bevely Palmer, PA  Patients Current Diet:     Diet Order                  Diet - low sodium heart healthy            Diet Carb Modified            Diet heart healthy/carb modified Room service appropriate? Yes; Fluid consistency: Thin  Diet effective now                  Precautions / Restrictions Precautions Precautions: Fall Other Brace: Limb protector Restrictions Weight Bearing Restrictions: Yes LLE Weight Bearing: Non weight bearing   Has the patient had 2 or more falls or a fall with injury in the past year? No  Prior Activity Level Limited Community (1-2x/wk): gets out of house ~2x/week  Prior Functional Level Self Care: Did the patient need help bathing, dressing, using the toilet or eating? Independent  Indoor Mobility: Did the patient need assistance with walking from room to room (with or without device)? Independent  Stairs: Did the patient need assistance with internal or external stairs (with or without device)? Independent  Functional Cognition: Did the patient need help planning regular tasks such as shopping or remembering to take medications? Needed some help  Home Assistive Devices / [place] Devices/Equipment: None Home Equipment: Walker - 2 wheels  Prior Device Use: Indicate devices/aids used by the patient prior to current illness, exacerbation or injury? crutches  Current Functional Level Cognition  Overall Cognitive Status: Impaired/Different from baseline Current Attention Level: Sustained Orientation Level: Oriented X4 Following Commands: Follows one step commands consistently, Follows one step commands with increased time General Comments: pt following multistep commands with no delay.    Extremity Assessment (includes Sensation/Coordination)  Upper Extremity Assessment: Defer to OT evaluation  Lower Extremity Assessment: LLE  deficits/detail, RLE deficits/detail RLE Deficits /  Details: noted area of warmth and errythema on mid shin, AROM limited knee flexion about 80 in supine, improved in sitting, strength at least 4/5 LLE Deficits / Details: L BKA with wound vac noted SLR and knee extension WFL, limited knee flexion with some edema present, did not test strength due to pain at least 3/5    ADLs  Overall ADL's : Needs assistance/impaired Eating/Feeding: Set up Grooming: Set up Upper Body Bathing: Set up, Supervision/ safety Lower Body Bathing: Set up, Moderate assistance, Cueing for safety, Cueing for sequencing, Sitting/lateral leans Upper Body Dressing : Set up, Sitting Lower Body Dressing: Supervision/safety, Set up, Moderate assistance, Sitting/lateral leans Toilet Transfer: +2 for physical assistance, Cueing for sequencing, Stand-pivot, RW Toilet Transfer Details (indicate cue type and reason): VC's for RW management Toileting- Clothing Manipulation and Hygiene: Moderate assistance, Sitting/lateral lean Functional mobility during ADLs: +2 for physical assistance, Rolling walker    Mobility  Overal bed mobility: Needs Assistance Bed Mobility: Supine to Sit Supine to sit: Supervision General bed mobility comments: Pt seated in recliner.    Transfers  Overall transfer level: Needs assistance Equipment used: Rolling walker (2 wheeled) Transfers: Sit to/from Stand Sit to Stand: Min assist Stand pivot transfers: Min assist General transfer comment: Min A +1 with cues for hand placement to rise into standing from recliner chair.    Ambulation / Gait / Stairs / Wheelchair Mobility  Ambulation/Gait Ambulation/Gait assistance: Herbalist (Feet): 25 Feet Assistive device: Rolling walker (2 wheeled) Gait Pattern/deviations: Step-to pattern, Trunk flexed General Gait Details: Hop to pattern with flexed posture. Cues for upper trunk control and RW safety.    Posture / Balance Dynamic Sitting Balance Sitting balance - Comments: sitting  EOB with S Balance Overall balance assessment: Needs assistance Sitting-balance support: Bilateral upper extremity supported Sitting balance-Leahy Scale: Fair Sitting balance - Comments: sitting EOB with S Standing balance support: Bilateral upper extremity supported Standing balance-Leahy Scale: Poor Standing balance comment: UE support in standing    Special needs/care consideration Wound Vac yes, LLE, Skin surgical incision: Left residual LE, Diabetic management yes and Designated visitor Jolee Ewing, sister; Younis Mathey, mother   Previous Home Environment (from acute therapy documentation) Living Arrangements: Alone Available Help at Discharge: Family Type of Home: Mobile home Home Layout: One level Home Access: Stairs to enter Entrance Stairs-Rails: None Entrance Stairs-Number of Steps: 1 Bathroom Shower/Tub: Chiropodist: Standard Bathroom Accessibility: Yes How Accessible: Accessible via walker [place]: No Additional Comments: Neice supposted to stay with him  Discharge Living Setting Plans for Discharge Living Setting: Patient's home Type of Home at Discharge: Mobile home Discharge Home Layout: One level Discharge Home Access: Stairs to enter Entrance Stairs-Rails: None Entrance Stairs-Number of Steps: 1 Discharge Bathroom Shower/Tub: Tub/shower unit Discharge Bathroom Toilet: Standard Discharge Bathroom Accessibility: Yes How Accessible: Accessible via walker Does the patient have any problems obtaining your medications?: Yes (Describe)  Social/Family/Support Systems Anticipated Caregiver: sisters: Conrad Zajkowski and [name] Anticipated Caregiver's Contact Information: Mickel Baas [phone] Discharge Plan Discussed with Primary Caregiver: Yes Is Caregiver In Agreement with Plan?: Yes Does Caregiver/Family have Issues with Lodging/Transportation while Pt is in Rehab?: No  Goals Patient/Family Goal for  Rehab: PT/OT short distance ambulation with RW supervision, mod I w/c level Expected length of stay: 12-14 days Pt/Family Agrees to Admission and willing to participate: Yes Program Orientation Provided & Reviewed with Pt/Caregiver Including Roles  & Responsibilities: Yes  Decrease  burden of Care through IP rehab admission: NA d/t anticipate pt meeting CIR goals  Possible need for SNF placement upon discharge: NA  Patient Condition: I have reviewed medical records from [facility], spoken with CM, and patient and family member. I met with patient at the bedside and discussed via phone for inpatient rehabilitation assessment.  Patient will benefit from ongoing PT and OT, can actively participate in 3 hours of therapy a day 5 days of the week, and can make measurable gains during the admission.  Patient will also benefit from the coordinated team approach during an Inpatient Acute Rehabilitation admission.  The patient will receive intensive therapy as well as Rehabilitation physician, nursing, social worker, and care management interventions.  Due to safety, skin/wound care, disease management, medication administration, pain management and patient education the patient requires 24 hour a day rehabilitation nursing.  The patient is currently min assist with mobility and basic ADLs.  Discharge setting and therapy post discharge at home with home health is anticipated.  Patient has agreed to participate in the Acute Inpatient Rehabilitation Program and will admit today.  Preadmission Screen Completed By: Gayland Curry, SLP, with updates by Courtney Heys, PT, DPT 06/30/2020 10:51 AM ______________________________________________________________________   Discussed status with Dr. Dagoberto Ligas on 06/30/20  at 10:51 AM  and received approval for admission today.  Admission Coordinator: Gayland Curry and brief updates by Courtney Heys, MD, DPT time 10:51 AM Sudie Grumbling 06/30/20     Assessment/Plan: Diagnosis: 1. Does the need for close, 24 hr/day Medical supervision in concert with the patient's rehab needs make it unreasonable for this patient to be served in a less intensive setting? Yes 2. 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 Due to bowel management, safety, skin/wound care, disease management, medication administration, pain management and patient education, does the patient require 24 hr/day rehab nursing? Yes 4. Does the patient require coordinated care of a physician, rehab nurse, PT, OT, and SLP to address physical and functional deficits in the context of the above medical diagnosis(es)? Yes Addressing deficits in the following areas: balance, endurance, locomotion, strength, transferring, bathing, dressing, feeding, grooming and toileting 5. Can the patient actively participate in an intensive therapy program of at least 3 hrs of therapy 5 days a week? Yes 6. The potential for patient to make measurable gains while on inpatient rehab is good 7. Anticipated functional outcomes upon discharge from inpatient rehab: modified independent PT, modified independent OT, n/a SLP 8. Estimated rehab length of stay to reach the above functional goals is: 12-14 days 9. Anticipated discharge destination: Home 10. Overall Rehab/Functional Prognosis: good   MD Signature:         Revision History                          Note Details  Jan Fireman, MD File Time 06/30/2020 10:52 AM  Author Type Physician Status Signed  Last Editor Courtney Heys, MD Service Physical Medicine and Rehabilitation

## 2020-07-01 ENCOUNTER — Other Ambulatory Visit: Payer: Self-pay | Admitting: Neurology

## 2020-07-01 ENCOUNTER — Inpatient Hospital Stay (HOSPITAL_COMMUNITY): Payer: Medicaid Other | Admitting: Physical Therapy

## 2020-07-01 ENCOUNTER — Inpatient Hospital Stay (HOSPITAL_COMMUNITY): Payer: Medicaid Other

## 2020-07-01 DIAGNOSIS — F1010 Alcohol abuse, uncomplicated: Secondary | ICD-10-CM | POA: Diagnosis present

## 2020-07-01 DIAGNOSIS — Z87891 Personal history of nicotine dependence: Secondary | ICD-10-CM | POA: Diagnosis not present

## 2020-07-01 DIAGNOSIS — G8929 Other chronic pain: Secondary | ICD-10-CM | POA: Diagnosis present

## 2020-07-01 DIAGNOSIS — E538 Deficiency of other specified B group vitamins: Secondary | ICD-10-CM | POA: Diagnosis present

## 2020-07-01 DIAGNOSIS — F419 Anxiety disorder, unspecified: Secondary | ICD-10-CM | POA: Diagnosis present

## 2020-07-01 DIAGNOSIS — E1151 Type 2 diabetes mellitus with diabetic peripheral angiopathy without gangrene: Secondary | ICD-10-CM | POA: Diagnosis present

## 2020-07-01 DIAGNOSIS — Z8249 Family history of ischemic heart disease and other diseases of the circulatory system: Secondary | ICD-10-CM | POA: Diagnosis not present

## 2020-07-01 DIAGNOSIS — G621 Alcoholic polyneuropathy: Secondary | ICD-10-CM | POA: Diagnosis present

## 2020-07-01 DIAGNOSIS — Z89512 Acquired absence of left leg below knee: Secondary | ICD-10-CM | POA: Diagnosis not present

## 2020-07-01 DIAGNOSIS — G546 Phantom limb syndrome with pain: Secondary | ICD-10-CM | POA: Diagnosis present

## 2020-07-01 DIAGNOSIS — E1142 Type 2 diabetes mellitus with diabetic polyneuropathy: Secondary | ICD-10-CM | POA: Diagnosis present

## 2020-07-01 DIAGNOSIS — Z4781 Encounter for orthopedic aftercare following surgical amputation: Secondary | ICD-10-CM | POA: Diagnosis present

## 2020-07-01 DIAGNOSIS — Z809 Family history of malignant neoplasm, unspecified: Secondary | ICD-10-CM | POA: Diagnosis not present

## 2020-07-01 DIAGNOSIS — K219 Gastro-esophageal reflux disease without esophagitis: Secondary | ICD-10-CM | POA: Diagnosis present

## 2020-07-01 DIAGNOSIS — M1990 Unspecified osteoarthritis, unspecified site: Secondary | ICD-10-CM | POA: Diagnosis present

## 2020-07-01 DIAGNOSIS — E871 Hypo-osmolality and hyponatremia: Secondary | ICD-10-CM | POA: Diagnosis present

## 2020-07-01 DIAGNOSIS — D62 Acute posthemorrhagic anemia: Secondary | ICD-10-CM | POA: Diagnosis present

## 2020-07-01 DIAGNOSIS — Z794 Long term (current) use of insulin: Secondary | ICD-10-CM | POA: Diagnosis not present

## 2020-07-01 DIAGNOSIS — Z79899 Other long term (current) drug therapy: Secondary | ICD-10-CM | POA: Diagnosis not present

## 2020-07-01 DIAGNOSIS — I10 Essential (primary) hypertension: Secondary | ICD-10-CM | POA: Diagnosis present

## 2020-07-01 DIAGNOSIS — G250 Essential tremor: Secondary | ICD-10-CM | POA: Diagnosis present

## 2020-07-01 DIAGNOSIS — E7800 Pure hypercholesterolemia, unspecified: Secondary | ICD-10-CM | POA: Diagnosis present

## 2020-07-01 DIAGNOSIS — D696 Thrombocytopenia, unspecified: Secondary | ICD-10-CM | POA: Diagnosis present

## 2020-07-01 DIAGNOSIS — J45909 Unspecified asthma, uncomplicated: Secondary | ICD-10-CM | POA: Diagnosis present

## 2020-07-01 LAB — COMPREHENSIVE METABOLIC PANEL
ALT: 11 U/L (ref 0–44)
AST: 16 U/L (ref 15–41)
Albumin: 2.7 g/dL — ABNORMAL LOW (ref 3.5–5.0)
Alkaline Phosphatase: 51 U/L (ref 38–126)
Anion gap: 8 (ref 5–15)
BUN: 15 mg/dL (ref 6–20)
CO2: 29 mmol/L (ref 22–32)
Calcium: 9.5 mg/dL (ref 8.9–10.3)
Chloride: 97 mmol/L — ABNORMAL LOW (ref 98–111)
Creatinine, Ser: 0.98 mg/dL (ref 0.61–1.24)
GFR calc Af Amer: >60 mL/min (ref >60)
GFR calc non Af Amer: >60 mL/min (ref >60)
Glucose, Bld: 111 mg/dL — ABNORMAL HIGH (ref 70–99)
Potassium: 4.2 mmol/L (ref 3.5–5.1)
Sodium: 134 mmol/L — ABNORMAL LOW (ref 135–145)
Total Bilirubin: 0.2 mg/dL — ABNORMAL LOW (ref 0.3–1.2)
Total Protein: 7.6 g/dL (ref 6.5–8.1)

## 2020-07-01 LAB — GLUCOSE, CAPILLARY
Glucose-Capillary: 146 mg/dL — ABNORMAL HIGH (ref 70–99)
Glucose-Capillary: 152 mg/dL — ABNORMAL HIGH (ref 70–99)
Glucose-Capillary: 60 mg/dL — ABNORMAL LOW (ref 70–99)
Glucose-Capillary: 111 mg/dL — ABNORMAL HIGH (ref 70–99)
Glucose-Capillary: 147 mg/dL — ABNORMAL HIGH (ref 70–99)

## 2020-07-01 LAB — CBC WITH DIFFERENTIAL/PLATELET
Abs Immature Granulocytes: 0.22 10*3/uL — ABNORMAL HIGH (ref 0.00–0.07)
Basophils Absolute: 0 10*3/uL (ref 0.0–0.1)
Basophils Relative: 1 %
Eosinophils Absolute: 0.3 10*3/uL (ref 0.0–0.5)
Eosinophils Relative: 5 %
HCT: 27.7 % — ABNORMAL LOW (ref 39.0–52.0)
Hemoglobin: 8.4 g/dL — ABNORMAL LOW (ref 13.0–17.0)
Immature Granulocytes: 4 %
Lymphocytes Relative: 22 %
Lymphs Abs: 1.3 10*3/uL (ref 0.7–4.0)
MCH: 25.9 pg — ABNORMAL LOW (ref 26.0–34.0)
MCHC: 30.3 g/dL (ref 30.0–36.0)
MCV: 85.5 fL (ref 80.0–100.0)
Monocytes Absolute: 0.4 10*3/uL (ref 0.1–1.0)
Monocytes Relative: 7 %
Neutro Abs: 3.6 10*3/uL (ref 1.7–7.7)
Neutrophils Relative %: 61 %
Platelets: 288 10*3/uL (ref 150–400)
RBC: 3.24 MIL/uL — ABNORMAL LOW (ref 4.22–5.81)
RDW: 15.7 % — ABNORMAL HIGH (ref 11.5–15.5)
WBC: 5.9 10*3/uL (ref 4.0–10.5)
nRBC: 0 % (ref 0.0–0.2)

## 2020-07-01 MED ORDER — BLOOD PRESSURE CONTROL BOOK
Freq: Once | Status: AC
  Filled 2020-07-01: qty 1

## 2020-07-01 MED ORDER — LIVING WELL WITH DIABETES BOOK
Freq: Once | Status: AC
  Filled 2020-07-01: qty 1

## 2020-07-01 NOTE — Progress Notes (Signed)
Physical Therapy Session Note  Patient Details  Name: Eugene Diaz MRN: [id number] Date of Birth: 03-14-1966  Today's Date: 07/01/2020 PT Individual Time: 1515-1630 PT Individual Time Calculation (min): 75 min   Short Term Goals: Week 1:  PT Short Term Goal 1 (Week 1): =LTG due to ELOS  Skilled Therapeutic Interventions/Progress Updates:    Pt received seated in w/c in room having just finished conversation with Education officer, museum. Pt emotional after discussing that it is the anniversary of his wife passing away last year. Provided emotional support and CSW to schedule patient with neuropsychology. Pt is at Supervision level for w/c mobility up to 150 ft with use BUE throughout session. Sit to stand with min A to RW throughout session. Demonstrated how to perform safe car transfer with use of RW. Pt able to perform car transfer with min A and RW. Demonstrated how to safely ascend one 3" step with B handrails ascending backwards. Pt able to ascend one x 3" step with B handrails and mod A for balance ascending backwards. Pt to bring in photo of his step at home so therapy can better simulate home environment. Pt also encouraged to have handrails built on his step at home. Squat pivot transfer w/c to/from mat table with min A. Reviewed how to don/doff limb guard and residual limb care including shrinker management and wear schedule. Upon return to pt's room NT checks BG and found to be low, provided pt with regular Sprite. Pt left seated in w/c in room with needs in reach, sister present at end of session.  Therapy Documentation Precautions:  Precautions Precautions: Fall Required Braces or Orthoses: Other Brace Other Brace: Limb protector Restrictions Weight Bearing Restrictions: Yes LLE Weight Bearing: Non weight bearing    Therapy/Group: Individual Therapy   Excell Seltzer, PT, DPT  07/01/2020, 5:17 PM

## 2020-07-01 NOTE — Progress Notes (Signed)
Inpatient Rehabilitation  Patient information reviewed and entered into eRehab system by Ares Tegtmeyer M. Melisse Caetano, M.A., CCC/SLP, PPS Coordinator.  Information including medical coding, functional ability and quality indicators will be reviewed and updated through discharge.    

## 2020-07-01 NOTE — Progress Notes (Signed)
Patient Details  Name: Eugene Diaz MRN: [id number] Date of Birth: [birth year]  Today's Date: 07/01/2020  Hospital Problems: Principal Problem:   Unilateral complete BKA, left, initial encounter [facility])  Past Medical History:  Past Medical History:  Diagnosis Date   Alcohol abuse    Alcoholic peripheral neuropathy ([place])    Anxiety    Arthritis    Asthma    followed by pcp   B12 deficiency    Depression    Diabetic neuropathy ([place])    Edema of both lower extremities    Essential tremor    neurologist-- dr patel--- due to alcohol abuse   GERD (gastroesophageal reflux disease)    History of cellulitis 2020   left lower leg;   recurrent 03-25-2020   History of esophageal stricture    s/p  dilatation 02-13-2018   History of osteomyelitis 2020   left great toe     Hypercholesterolemia    Hypertension    followed by pcp  (nuclear stress test 03-11-2014 low risk w/ no ischemia, ef 65%   Normocytic anemia    followed by pcp   (03-18-2020 had transfusion's 02/ 2021)   Peripheral vascular disease ([place])    Status post incision and drainage followed by Dr [name] and pcp   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   (03-17-2020  pt states most of incision is healed with exception an area that is still draining,  daily dressing change),  foot is red but not warm to the touch and has swelling but has improved)   Thrombocytopenia ([facility])    chronic   Type 2 diabetes mellitus ([place])    followed by pcp---    (03-18-2020  pt stated does not check blood sugar)   Past Surgical History:  Past Surgical History:  Procedure Laterality Date   AMPUTATION Left 10/16/2019   Procedure: Left partial second ray resection; placement of antibiotic beads;  Surgeon: Evelina Bucy, DPM;  [facility];  Service: Podiatry;  Laterality: Left;   AMPUTATION Left 11/13/2019   Procedure: Left Midfoot Amputation - Transmetatarsal vs. Lisfranc; Placement antibiotic beads;  Surgeon: Evelina Bucy, DPM;   [facility];  Service: Podiatry;  Laterality: Left;   AMPUTATION Left 12/21/2019   Procedure: Chopart Amputation left foot;  Surgeon: Evelina Bucy, DPM;  Location: [place];  Service: Podiatry;  Laterality: Left;   AMPUTATION Left 06/24/2020   Procedure: LEFT BELOW KNEE AMPUTATION;  Surgeon: Newt Minion, MD;  Location: [place];  Service: Orthopedics;  Laterality: Left;   AMPUTATION TOE Left 08/14/2019   Procedure: AMPUTATION GREAT TOE;  Surgeon: Evelina Bucy, DPM;  [facility];  Service: Podiatry;  Laterality: Left;   APPLICATION OF WOUND VAC Left 12/21/2019   Procedure: Application Of Wound Vac;  Surgeon: Evelina Bucy, DPM;  Location: [place];  Service: Podiatry;  Laterality: Left;   BIOPSY  02/13/2018   Procedure: BIOPSY;  Surgeon: Danie Binder, MD;  [facility];  Service: Endoscopy;;  transverse colon biopsy, gastric biopsy   COLONOSCOPY WITH PROPOFOL N/A 02/13/2018   Procedure: COLONOSCOPY WITH PROPOFOL;  Surgeon: Danie Binder, MD;  [facility];  Service: Endoscopy;  Laterality: N/A;  11:15am   ESOPHAGOGASTRODUODENOSCOPY (EGD) WITH PROPOFOL N/A 02/13/2018   Procedure: ESOPHAGOGASTRODUODENOSCOPY (EGD) WITH PROPOFOL;  Surgeon: Danie Binder, MD;  [facility];  Service: Endoscopy;  Laterality: N/A;   I & D EXTREMITY Left 07/16/2019   Procedure: Insicion  AND DEBRIDEMENT EXTREMITY;  Surgeon: Evelina Bucy, DPM;  Location: [place];  Service: Podiatry;  Laterality: Left;   I & D EXTREMITY Left 06/22/2020   Procedure: IRRIGATION AND DEBRIDEMENT EXTREMITY, left foot and ankle;  Surgeon: Evelina Bucy, DPM;  Location: [place];  Service: Podiatry;  Laterality: Left;   INCISION AND DRAINAGE OF WOUND Left 10/13/2019   Procedure: IRRIGATION AND DEBRIDEMENT WOUND OF LEFT FOOT AND FIRST METATARSAL RESECTION;  Surgeon: Evelina Bucy, DPM;  [facility];  Service: Podiatry;  Laterality: Left;   IRRIGATION AND DEBRIDEMENT FOOT Left 11/11/2019    Procedure: Left Foot Wound Irrigation and Debridement;  Surgeon: Evelina Bucy, DPM;  [facility];  Service: Podiatry;  Laterality: Left;   Left arm     Left arm repair (tendon and artery)   PERCUTANEOUS PINNING  07/16/2019   Procedure: Open Reduction Percutaneous Pinning Extremity;  Surgeon: Evelina Bucy, DPM;  Location: [place];  Service: Podiatry;;   PILONIDAL CYST EXCISION N/A 08/08/2014   Procedure: CYST EXCISION PILONIDAL EXTENSIVE;  Surgeon: James[place] So, MD;  [facility];  Service: General;  Laterality: N/A;   POLYPECTOMY  02/13/2018   Procedure: POLYPECTOMY;  Surgeon: Danie Binder, MD;  [facility];  Service: Endoscopy;;  transverse colon polyp hs, rectal polyps times 2   SAVORY DILATION N/A 02/13/2018   Procedure: SAVORY DILATION;  Surgeon: Danie Binder, MD;  [facility];  Service: Endoscopy;  Laterality: N/A;   WOUND DEBRIDEMENT Left 09/04/2019   Procedure: DEBRIDEMENT WOUND WITH COMPLEX  REPAIR OF DEHISCENCE;  Surgeon: Evelina Bucy, DPM;  Location: [place];  Service: Podiatry;  Laterality: Left;  Leave patient on stretcher   WOUND DEBRIDEMENT Left 10/16/2019   Procedure: Left foot wound debridement and closure;  Surgeon: Evelina Bucy, DPM;  [facility];  Service: Podiatry;  Laterality: Left;   WOUND DEBRIDEMENT Left 12/18/2019   Procedure: LEFT FOOT DEBRIDEMENT WITH PARTIAL INCISION OF INFECTED BONE;  Surgeon: Evelina Bucy, DPM;  Location: [place];  Service: Podiatry;  Laterality: Left;  LEFT FOOT DEBRIDEMENT WITH PARTIAL INCISION OF INFECTED BONE   Social History:  reports that he quit smoking about 35 years ago. His smoking use included cigarettes. He has a 62.50 pack-year smoking history. He has never used smokeless tobacco. He reports current alcohol use of about 50.0 standard drinks of alcohol per week. He reports previous drug use. Drug: Marijuana.  Family / Support Systems Marital Status:  Widow/Widower How Long?: 1 year ago. Married for 30+yrs prior to passing. Spouse/Significant Other: Widowed Children: no children Other Supports: sisters Anticipated Caregiver: Sister Mickel Baas intends to move into the home with him Ability/Limitations of Caregiver: None reported. Caregiver Availability: 24/7 Family Dynamics: Pt lives alone.  Social History Preferred language: English Religion: Christian Cultural Background: Pt worked on Hyd[employer] for 4-5 years prior to becoming disabled in 2018 due to health reasons Education: 8th grade Read: Yes Write: Yes Employment Status: Disabled Date Retired/Disabled/Unemployed: 2018 Guardian/Conservator: Denies   Abuse/Neglect Abuse/Neglect Assessment Can Be Completed: Yes Physical Abuse: Denies Verbal Abuse: Denies Sexual Abuse: Denies Exploitation of patient/patient's resources: Denies Self-Neglect: Denies  Emotional Status Pt's affect, behavior and adjustment status: Pt tearful during visit due to one year anniversary of wife's passing. Recent Psychosocial Issues: 1 yr anniversary of wife's passing. Psychiatric History: Denies Substance Abuse History: Pt admits to drinking excessively at one point, and has decreased to 4-5 beers per day.  Admits this could possibly have increased after wife's passing.  Patient / Family Perceptions, Expectations & Goals Pt/Family understanding of illness & functional limitations: pt has general understanding of care needs Premorbid pt/family roles/activities: Independent Anticipated changes in roles/activities/participation: Assistance with ADLs/IADLs  Community Resources Express Scripts: None Premorbid Home Care/DME Agencies: None Transportation available at discharge: Sister Careers information officer referrals recommended: Neuropsychology  Discharge Planning Living Arrangements: Alone, Other relatives Support Systems: Other relatives Type of Residence: Private residence Insurance Resources:  Multimedia programmer (specify) [place] Medicaid) Financial Resources: SSD Financial Screen Referred: No Living Expenses: Own Money Management: Patient Does the patient have any problems obtaining your medications?: No Care Coordinator Barriers to Discharge: Decreased caregiver support, Lack of/limited family support Care Coordinator Anticipated Follow Up Needs: HH/OP  Clinical Impression SW met with pt in room to introduce self, explain role, and discuss discharge process. Pt has no formal HCPOA paperwork. States his surrogate HCPOA would be his sister [name] who lives in [place] [phone]). DME: RW, shower chair, and elevated toilet seat.   Maui Britten A Kaian Fahs 07/01/2020, 4:00 PM

## 2020-07-01 NOTE — Evaluation (Signed)
Occupational Therapy Assessment and Plan  Patient Details  Name: Eugene Diaz MRN: [id number] Date of Birth: 1966/10/09  OT Diagnosis: abnormal posture, acute pain, muscle weakness (generalized), pain in joint and swelling of limb Rehab Potential: Rehab Potential (ACUTE ONLY): Good ELOS: 7-10   Today's Date: 07/01/2020 OT Individual Time: 7564-3329 OT Individual Time Calculation (min): 60 min     Hospital Problem: Principal Problem:   Unilateral complete BKA, left, initial encounter [facility])   Past Medical History:  Past Medical History:  Diagnosis Date  . Alcohol abuse   . Alcoholic peripheral neuropathy ([place])   . Anxiety   . Arthritis   . Asthma    followed by pcp  . B12 deficiency   . Depression   . Diabetic neuropathy ([place])   . Edema of both lower extremities   . Essential tremor    neurologist-- dr patel--- due to alcohol abuse  . GERD (gastroesophageal reflux disease)   . History of cellulitis 2020   left lower leg;   recurrent 03-25-2020  . History of esophageal stricture    s/p  dilatation 02-13-2018  . History of osteomyelitis 2020   left great toe    . Hypercholesterolemia   . Hypertension    followed by pcp  (nuclear stress test 03-11-2014 low risk w/ no ischemia, ef 65%  . Normocytic anemia    followed by pcp   (03-18-2020 had transfusion's 02/ 2021)  . Peripheral vascular disease ([place])   . 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 Thrombocytopenia ([facility])    chronic  . Type 2 diabetes mellitus ([place])    followed by pcp---    (03-18-2020  pt stated does not check blood sugar)   Past Surgical History:  Past Surgical History:  Procedure Laterality Date  . AMPUTATION Left 10/16/2019    Procedure: Left partial second ray resection; placement of antibiotic beads;  Surgeon: Evelina Bucy, DPM;  [facility];  Service: Podiatry;  Laterality: Left;  . AMPUTATION Left 11/13/2019   Procedure: Left Midfoot Amputation - Transmetatarsal vs. Lisfranc; Placement antibiotic beads;  Surgeon: Evelina Bucy, DPM;  [facility];  Service: Podiatry;  Laterality: Left;  . AMPUTATION Left 12/21/2019   Procedure: Chopart Amputation left foot;  Surgeon: Evelina Bucy, DPM;  Location: [place];  Service: Podiatry;  Laterality: Left;  . AMPUTATION Left 06/24/2020   Procedure: LEFT BELOW KNEE AMPUTATION;  Surgeon: Newt Minion, MD;  Location: [place];  Service: Orthopedics;  Laterality: Left;  . AMPUTATION TOE Left 08/14/2019   Procedure: AMPUTATION GREAT TOE;  Surgeon: Evelina Bucy, DPM;  [facility];  Service: Podiatry;  Laterality: Left;  . APPLICATION OF WOUND VAC Left 12/21/2019   Procedure: Application Of Wound Vac;  Surgeon: Evelina Bucy, DPM;  Location: [place];  Service: Podiatry;  Laterality: Left;  . BIOPSY  02/13/2018   Procedure: BIOPSY;  Surgeon: Danie Binder, MD;  [facility];  Service: Endoscopy;;  transverse colon biopsy, gastric biopsy  . COLONOSCOPY WITH PROPOFOL N/A 02/13/2018   Procedure: COLONOSCOPY WITH PROPOFOL;  Surgeon: Danie Binder, MD;  [facility];  Service: Endoscopy;  Laterality:  N/A;  11:15am  . ESOPHAGOGASTRODUODENOSCOPY (EGD) WITH PROPOFOL N/A 02/13/2018   Procedure: ESOPHAGOGASTRODUODENOSCOPY (EGD) WITH PROPOFOL;  Surgeon: Danie Binder, MD;  [facility];  Service: Endoscopy;  Laterality: N/A;  . I & D EXTREMITY Left 07/16/2019   Procedure: Insicion  AND DEBRIDEMENT EXTREMITY;  Surgeon: Evelina Bucy, DPM;  Location: [place];  Service: Podiatry;  Laterality: Left;  . I & D EXTREMITY Left 06/22/2020   Procedure: IRRIGATION AND DEBRIDEMENT EXTREMITY, left foot and ankle;  Surgeon: Evelina Bucy, DPM;   Location: [place];  Service: Podiatry;  Laterality: Left;  . INCISION AND DRAINAGE OF WOUND Left 10/13/2019   Procedure: IRRIGATION AND DEBRIDEMENT WOUND OF LEFT FOOT AND FIRST METATARSAL RESECTION;  Surgeon: Evelina Bucy, DPM;  [facility];  Service: Podiatry;  Laterality: Left;  . IRRIGATION AND DEBRIDEMENT FOOT Left 11/11/2019   Procedure: Left Foot Wound Irrigation and Debridement;  Surgeon: Evelina Bucy, DPM;  [facility];  Service: Podiatry;  Laterality: Left;  . Left arm     Left arm repair (tendon and artery)  . PERCUTANEOUS PINNING  07/16/2019   Procedure: Open Reduction Percutaneous Pinning Extremity;  Surgeon: Evelina Bucy, DPM;  Location: [place];  Service: Podiatry;;  . PILONIDAL CYST EXCISION N/A 08/08/2014   Procedure: CYST EXCISION PILONIDAL EXTENSIVE;  Surgeon: [place] So, MD;  [facility];  Service: General;  Laterality: N/A;  . POLYPECTOMY  02/13/2018   Procedure: POLYPECTOMY;  Surgeon: Danie Binder, MD;  [facility];  Service: Endoscopy;;  transverse colon polyp hs, rectal polyps times 2  . SAVORY DILATION N/A 02/13/2018   Procedure: SAVORY DILATION;  Surgeon: Danie Binder, MD;  [facility];  Service: Endoscopy;  Laterality: N/A;  . WOUND DEBRIDEMENT Left 09/04/2019   Procedure: DEBRIDEMENT WOUND WITH COMPLEX  REPAIR OF DEHISCENCE;  Surgeon: Evelina Bucy, DPM;  Location: [place];  Service: Podiatry;  Laterality: Left;  Leave patient on stretcher  . WOUND DEBRIDEMENT Left 10/16/2019   Procedure: Left foot wound debridement and closure;  Surgeon: Evelina Bucy, DPM;  [facility];  Service: Podiatry;  Laterality: Left;  . WOUND DEBRIDEMENT Left 12/18/2019   Procedure: LEFT FOOT DEBRIDEMENT WITH PARTIAL INCISION OF INFECTED BONE;  Surgeon: Evelina Bucy, DPM;  Location: [place];  Service: Podiatry;  Laterality: Left;  LEFT FOOT DEBRIDEMENT WITH PARTIAL INCISION OF INFECTED BONE    Assessment &  Plan Clinical Impression: Eugene Diaz is an 54 y.o. male with medical history significant for left foot osteomyelitis and amputation, diabetes mellitus, hypertension, alcoholism.  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  Patient currently requires min with basic self-care skills and IADL secondary to muscle weakness, decreased cardiorespiratoy endurance, delayed processing and decreased standing balance, decreased postural control and decreased balance strategies.  Prior to hospitalization, patient could complete BADL with independent .  Patient will benefit from skilled intervention to decrease level of assist with basic self-care skills, increase independence with basic self-care skills and increase level of independence with iADL prior to discharge home with care partner.  Anticipate patient will require intermittent supervision and follow up home health.  OT - End of Session Activity Tolerance: Tolerates 30+ min activity with multiple rests Endurance Deficit: Yes Endurance Deficit Description: frequent rest breaks during functional activity OT Assessment Rehab Potential (ACUTE ONLY): Good OT Barriers to Discharge: [place] home environment;Decreased  caregiver support;Weight bearing restrictions OT Patient demonstrates impairments in the following area(s): Balance;Edema;Endurance;Pain;Safety;Sensory;Skin Integrity OT Basic ADL's Functional Problem(s): Grooming;Bathing;Dressing;Toileting OT Transfers Functional Problem(s): Toilet;Tub/Shower OT Plan OT Intensity: Minimum of 1-2 x/day, 45 to 90 minutes OT Frequency: 5 out of 7 days OT Duration/Estimated Length of Stay: 7-10 OT Treatment/Interventions: Balance/vestibular training;Discharge planning;Pain management;Self Care/advanced ADL retraining;UE/LE Coordination activities;Therapeutic Activities;Visual/perceptual remediation/compensation;Therapeutic Exercise;Skin care/wound  managment;Patient/family education;Functional mobility training;Disease mangement/prevention;Community reintegration;DME/adaptive equipment instruction;Neuromuscular re-education;Psychosocial support;Splinting/orthotics;UE/LE Strength taining/ROM;Wheelchair propulsion/positioning OT Self Feeding Anticipated Outcome(s): MOD OT Basic Self-Care Anticipated Outcome(s): MOD I OT Toileting Anticipated Outcome(s): MOD I toilet, S shower OT Bathroom Transfers Anticipated Outcome(s): MODI toilet, S shower OT Recommendation Patient destination: Home Follow Up Recommendations: Home health OT Equipment Recommended: 3 in 1 bedside comode;Tub/shower seat;To be determined   OT Evaluation Precautions/Restrictions  Precautions Precautions: Fall Required Braces or Orthoses: Other Brace Other Brace: Limb protector Restrictions Weight Bearing Restrictions: Yes LLE Weight Bearing: Non weight bearing General Chart Reviewed: Yes Family/Caregiver Present: No Vital Signs  Pain Pain Assessment Pain Scale: 0-10 Pain Score: 3  Pain Type: Surgical pain Pain Location: Leg Pain Orientation: Left Pain Descriptors / Indicators: Constant Pain Frequency: Constant Pain Onset: On-going Patients Stated Pain Goal: 4 Pain Intervention(s): Repositioned Home Living/Prior Functioning Home Living Family/patient expects to be discharged to:: Private residence Living Arrangements: Alone Available Help at Discharge: Family, Available PRN/intermittently Type of Home: Mobile home Home Access: Stairs to enter Technical brewer of Steps: 1 Entrance Stairs-Rails: None (reports friend can install handrail) Home Layout: One level Bathroom Shower/Tub: Government social research officer Accessibility: Yes Additional Comments: niece to stay with him at d/c  Lives With: Alone Prior Function Level of Independence: Independent with gait, Independent with transfers, Requires assistive device for  independence  Able to Take Stairs?: Yes Driving: Yes Comments: used crutches at home Vision Baseline Vision/History: No visual deficits Vision Assessment?: No apparent visual deficits Perception  Perception: Within Functional Limits Praxis Praxis: Intact Cognition Overall Cognitive Status: Within Functional Limits for tasks assessed Arousal/Alertness: Awake/alert Orientation Level: Person;Situation;Place Person: Oriented Place: Oriented Situation: Oriented Year: 2021 Month: September Day of Week: Incorrect Memory: Appears intact Immediate Memory Recall: Sock;Blue;Bed Memory Recall Sock: Without Cue Memory Recall Blue: Without Cue Memory Recall Bed: With Cue Attention: Focused Focused Attention: Appears intact Awareness: Appears intact Problem Solving: Appears intact Safety/Judgment: Impaired Comments: slow process Sensation Sensation Light Touch: Impaired Detail Light Touch Impaired Details: Impaired RLE;Impaired LLE (neuropathy at baseline) Proprioception: Impaired Detail Proprioception Impaired Details: Impaired RLE;Impaired LLE Coordination Gross Motor Movements are Fluid and Coordinated: Yes Fine Motor Movements are Fluid and Coordinated: No Finger Nose Finger Test: [facility] Motor  Motor Motor: Within Functional Limits Motor - Skilled Clinical Observations: [facility]  Trunk/Postural Assessment  Cervical Assessment Cervical Assessment: Within Functional Limits Thoracic Assessment Thoracic Assessment: Within Functional Limits Lumbar Assessment Lumbar Assessment: Within Functional Limits Postural Control Postural Control: Deficits on evaluation  Balance Balance Balance Assessed: Yes Dynamic Sitting Balance Dynamic Sitting - Level of Assistance: 5: Stand by assistance Static Standing Balance Static Standing - Level of Assistance: 4: Min assist Extremity/Trunk Assessment RUE Assessment RUE Assessment: Within Functional Limits LUE Assessment LUE Assessment: Within  Functional Limits  Care Tool Care Tool Self Care Eating   Eating Assist Level: Independent    Oral Care    Oral Care Assist Level: Set up assist    Bathing   Body parts bathed by patient: Right arm;Left arm;Chest;Abdomen;Front perineal area;Right upper leg;Left upper leg;Face Body parts bathed by helper: Buttocks;Right lower leg  Body parts n/a: Left lower leg Assist Level: Minimal Assistance - Patient > 75%    Upper Body Dressing(including orthotics)   What is the patient wearing?: Pull over shirt   Assist Level: Set up assist    Lower Body Dressing (excluding footwear)   What is the patient wearing?: Pants Assist for lower body dressing: Moderate Assistance - Patient 50 - 74%    Putting on/Taking off footwear   What is the patient wearing?: Non-skid slipper socks Assist for footwear: Dependent - Patient 0%       Care Tool Toileting Toileting activity         Care Tool Bed Mobility Roll left and right activity   Roll left and right assist level: Minimal Assistance - Patient > 75%    Sit to lying activity   Sit to lying assist level: Minimal Assistance - Patient > 75%    Lying to sitting edge of bed activity   Lying to sitting edge of bed assist level: Minimal Assistance - Patient > 75%     Care Tool Transfers Sit to stand transfer   Sit to stand assist level: Minimal Assistance - Patient > 75%    Chair/bed transfer   Chair/bed transfer assist level: Minimal Assistance - Patient > 75%     Toilet transfer   Assist Level: Minimal Assistance - Patient > 75%     Care Tool Cognition Expression of Ideas and Wants Expression of Ideas and Wants: Without difficulty (complex and basic) - expresses complex messages without difficulty and with speech that is clear and easy to understand   Understanding Verbal and Non-Verbal Content Understanding Verbal and Non-Verbal Content: Understands (complex and basic) - clear comprehension without cues or repetitions    Memory/Recall Ability *first 3 days only      Refer to Care Plan for Long Term Goals  SHORT TERM GOAL WEEK 1 OT Short Term Goal 1 (Week 1): STG=LTG d/t ELOS  Recommendations for other services: None    Skilled Therapeutic Intervention 1:1. Pt received in w/c agreeable to OT after edu re OT role/purpose, CIR, ELOS and POC. Pt completes sink level ADL as stated below with up to MOD A for standing balance d/t lateral L lean with unilateral support on RW for advancing pants past hips. Pt demo overall delayed processing,however WFL for all other cognitive skills during session. Pt unable to bed forward to change sock, therefore OT educates on wide sock aide use. Pt able to use sock aide after demo to don non skid sock. Exited session with pt seated in w/c, exit alarm on and call light inr each  ADL ADL Eating: Independent Grooming: Setup Where Assessed-Grooming: Sitting at sink Upper Body Bathing: Setup Where Assessed-Upper Body Bathing: Sitting at sink Lower Body Bathing: Minimal assistance Where Assessed-Lower Body Bathing: Sitting at sink;Standing at sink Upper Body Dressing: Setup Where Assessed-Upper Body Dressing: Sitting at sink Lower Body Dressing: Moderate assistance Where Assessed-Lower Body Dressing: Sitting at sink;Standing at sink Toileting: Moderate assistance Toilet Transfer: Minimal assistance Toilet Transfer Method: Stand pivot Mobility  Bed Mobility Bed Mobility: Rolling Right;Rolling Left;Supine to Sit;Sit to Supine Rolling Right: Minimal Assistance - Patient > 75% Rolling Left: Minimal Assistance - Patient > 75% Supine to Sit: Minimal Assistance - Patient > 75% Sit to Supine: Minimal Assistance - Patient > 75% Transfers Sit to Stand: Minimal Assistance - Patient > 75% Stand to Sit: Minimal Assistance - Patient > 75%   Discharge Criteria: Patient will be discharged from OT  if patient refuses treatment 3 consecutive times without medical reason, if treatment  goals not met, if there is a change in medical status, if patient makes no progress towards goals or if patient is discharged from hospital.  The above assessment, treatment plan, treatment alternatives and goals were discussed and mutually agreed upon: by patient  Tonny Branch 07/01/2020, 10:12 AM

## 2020-07-01 NOTE — Evaluation (Signed)
Physical Therapy Assessment and Plan  Patient Details  Name: Eugene Diaz MRN: [id number] Date of Birth: Oct 10, 1966  PT Diagnosis: Abnormal posture, Abnormality of gait and Difficulty walking Rehab Potential: Excellent ELOS: 7-10 days   Today's Date: 07/01/2020 PT Individual Time: 0800-0900 PT Individual Time Calculation (min): 60 min    Hospital Problem: Principal Problem:   Unilateral complete BKA, left, initial encounter [facility])   Past Medical History:  Past Medical History:  Diagnosis Date  . Alcohol abuse   . Alcoholic peripheral neuropathy ([place])   . Anxiety   . Arthritis   . Asthma    followed by pcp  . B12 deficiency   . Depression   . Diabetic neuropathy ([place])   . Edema of both lower extremities   . Essential tremor    neurologist-- dr patel--- due to alcohol abuse  . GERD (gastroesophageal reflux disease)   . History of cellulitis 2020   left lower leg;   recurrent 03-25-2020  . History of esophageal stricture    s/p  dilatation 02-13-2018  . History of osteomyelitis 2020   left great toe    . Hypercholesterolemia   . Hypertension    followed by pcp  (nuclear stress test 03-11-2014 low risk w/ no ischemia, ef 65%  . Normocytic anemia    followed by pcp   (03-18-2020 had transfusion's 02/ 2021)  . Peripheral vascular disease ([place])   . 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 Thrombocytopenia (HCC)    chronic  . Type 2 diabetes mellitus ([place])    followed by pcp---    (03-18-2020  pt stated does not check blood sugar)   Past Surgical History:  Past Surgical History:  Procedure Laterality Date  . AMPUTATION Left 10/16/2019   Procedure: Left partial second ray resection; placement of antibiotic  beads;  Surgeon: Evelina Bucy, DPM;  Location: [facility];  Service: Podiatry;  Laterality: Left;  . AMPUTATION Left 11/13/2019   Procedure: Left Midfoot Amputation - Transmetatarsal vs. Lisfranc; Placement antibiotic beads;  Surgeon: Evelina Bucy, DPM;  Location: [facility];  Service: Podiatry;  Laterality: Left;  . AMPUTATION Left 12/21/2019   Procedure: Chopart Amputation left foot;  Surgeon: Evelina Bucy, DPM;  Location: [place];  Service: Podiatry;  Laterality: Left;  . AMPUTATION Left 06/24/2020   Procedure: LEFT BELOW KNEE AMPUTATION;  Surgeon: Newt Minion, MD;  Location: [place];  Service: Orthopedics;  Laterality: Left;  . AMPUTATION TOE Left 08/14/2019   Procedure: AMPUTATION GREAT TOE;  Surgeon: Evelina Bucy, DPM;  [facility];  Service: Podiatry;  Laterality: Left;  . APPLICATION OF WOUND VAC Left 12/21/2019   Procedure: Application Of Wound Vac;  Surgeon: Evelina Bucy, DPM;  Location: [place];  Service: Podiatry;  Laterality: Left;  . BIOPSY  02/13/2018   Procedure: BIOPSY;  Surgeon: Danie Binder, MD;  [facility];  Service: Endoscopy;;  transverse colon biopsy, gastric biopsy  . COLONOSCOPY WITH PROPOFOL N/A 02/13/2018   Procedure: COLONOSCOPY WITH PROPOFOL;  Surgeon: Danie Binder, MD;  [facility];  Service: Endoscopy;  Laterality: N/A;  11:15am  . ESOPHAGOGASTRODUODENOSCOPY (EGD) WITH PROPOFOL N/A  02/13/2018   Procedure: ESOPHAGOGASTRODUODENOSCOPY (EGD) WITH PROPOFOL;  Surgeon: Danie Binder, MD;  [facility];  Service: Endoscopy;  Laterality: N/A;  . I & D EXTREMITY Left 07/16/2019   Procedure: Insicion  AND DEBRIDEMENT EXTREMITY;  Surgeon: Evelina Bucy, DPM;  Location: [place];  Service: Podiatry;  Laterality: Left;  . I & D EXTREMITY Left 06/22/2020   Procedure: IRRIGATION AND DEBRIDEMENT EXTREMITY, left foot and ankle;  Surgeon: Evelina Bucy, DPM;  Location: [place];  Service: Podiatry;  Laterality: Left;  . INCISION AND  DRAINAGE OF WOUND Left 10/13/2019   Procedure: IRRIGATION AND DEBRIDEMENT WOUND OF LEFT FOOT AND FIRST METATARSAL RESECTION;  Surgeon: Evelina Bucy, DPM;  [facility];  Service: Podiatry;  Laterality: Left;  . IRRIGATION AND DEBRIDEMENT FOOT Left 11/11/2019   Procedure: Left Foot Wound Irrigation and Debridement;  Surgeon: Evelina Bucy, DPM;  [facility];  Service: Podiatry;  Laterality: Left;  . Left arm     Left arm repair (tendon and artery)  . PERCUTANEOUS PINNING  07/16/2019   Procedure: Open Reduction Percutaneous Pinning Extremity;  Surgeon: Evelina Bucy, DPM;  Location: [place];  Service: Podiatry;;  . PILONIDAL CYST EXCISION N/A 08/08/2014   Procedure: CYST EXCISION PILONIDAL EXTENSIVE;  Surgeon: Jamesetta So, MD;  [facility];  Service: General;  Laterality: N/A;  . POLYPECTOMY  02/13/2018   Procedure: POLYPECTOMY;  Surgeon: Danie Binder, MD;  [facility];  Service: Endoscopy;;  transverse colon polyp hs, rectal polyps times 2  . SAVORY DILATION N/A 02/13/2018   Procedure: SAVORY DILATION;  Surgeon: Danie Binder, MD;  [facility];  Service: Endoscopy;  Laterality: N/A;  . WOUND DEBRIDEMENT Left 09/04/2019   Procedure: DEBRIDEMENT WOUND WITH COMPLEX  REPAIR OF DEHISCENCE;  Surgeon: Evelina Bucy, DPM;  Location: [place];  Service: Podiatry;  Laterality: Left;  Leave patient on stretcher  . WOUND DEBRIDEMENT Left 10/16/2019   Procedure: Left foot wound debridement and closure;  Surgeon: Evelina Bucy, DPM;  [facility];  Service: Podiatry;  Laterality: Left;  . WOUND DEBRIDEMENT Left 12/18/2019   Procedure: LEFT FOOT DEBRIDEMENT WITH PARTIAL INCISION OF INFECTED BONE;  Surgeon: Evelina Bucy, DPM;  Location: [place];  Service: Podiatry;  Laterality: Left;  LEFT FOOT DEBRIDEMENT WITH PARTIAL INCISION OF INFECTED BONE    Assessment & Plan Clinical Impression:  Eugene Diaz is an 54 year old male with  history of T2DM, polysubstance abuse, tremors, chronic pain, peripheral neuropathy, subacute osteomyelitis left ankle and left foot who was admitted via ED on 06/21/2020 with increased drainage from left foot with redness and swelling.  He was started on broad-spectrum antibiotics and was found to have acute osteomyelitis left ankle and hindfoot with septic arthritis as well as abscess.  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 Sharol Given.  Postop completed IV antibiotic course on 08/28 and acute blood loss anemia treated with 1 unit PRBC.  Has been on CIWA protocol without signs of alcohol withdrawal.  Therapy ongoing and CIR recommended due to functional decline. Patient transferred to CIR on 06/30/2020 .   Patient currently requires min with mobility secondary to muscle weakness, decreased cardiorespiratoy endurance, delayed processing and decreased sitting balance, decreased standing balance, decreased postural control and decreased balance strategies.  Prior to hospitalization, patient was modified independent  with mobility and  lived with Alone in a Mobile home home.  Home access is 1Stairs to enter.  Patient will benefit from skilled PT intervention to maximize safe functional mobility, minimize fall risk and decrease caregiver burden for planned discharge home with intermittent assist.  Anticipate patient will benefit from follow up [facility] at discharge.  PT - End of Session Activity Tolerance: Tolerates 30+ min activity with multiple rests Endurance Deficit: Yes Endurance Deficit Description: frequent rest breaks during functional activity PT Assessment Rehab Potential (ACUTE/IP ONLY): Excellent PT Barriers to Discharge: Home environment access/layout;Weight bearing restrictions PT Patient demonstrates impairments in the following area(s): Balance;Endurance;Motor;Pain;Safety;Sensory PT Transfers Functional Problem(s): Bed Mobility;Bed to  Chair;Car;Furniture;Floor PT Locomotion Functional Problem(s): Ambulation;Wheelchair Mobility;Stairs PT Plan PT Intensity: Minimum of 1-2 x/day ,45 to 90 minutes PT Frequency: 5 out of 7 days PT Duration Estimated Length of Stay: 7-10 days PT Treatment/Interventions: Ambulation/gait training;Balance/vestibular training;Community reintegration;Discharge planning;DME/adaptive equipment instruction;Functional mobility training;Pain management;Patient/family education;Psychosocial support;Skin care/wound management;Splinting/orthotics;Stair training;Therapeutic Activities;Therapeutic Exercise;UE/LE Strength taining/ROM;UE/LE Coordination activities;Wheelchair propulsion/positioning PT Transfers Anticipated Outcome(s): mod I PT Locomotion Anticipated Outcome(s): mod I with LRAD PT Recommendation Recommendations for Other Services: Neuropsych consult Follow Up Recommendations: Home health PT Patient destination: Home Equipment Recommended: Wheelchair cushion (measurements);Wheelchair (measurements);Other (comment) (other TBD pending progress) Equipment Details: TBD pending progress; 20x18 w/c   PT Evaluation Precautions/Restrictions Precautions Precautions: Fall Required Braces or Orthoses: Other Brace Other Brace: Limb protector Restrictions Weight Bearing Restrictions: Yes LLE Weight Bearing: Non weight bearing Home Living/Prior Functioning Home Living Available Help at Discharge: Family;Available PRN/intermittently Type of Home: Mobile home Home Access: Stairs to enter Entrance Stairs-Number of Steps: 1 Entrance Stairs-Rails: None (reports friend can install handrail) Home Layout: One level Bathroom Shower/Tub: Chiropodist: Standard Bathroom Accessibility: Yes Additional Comments: niece to stay with him at d/c  Lives With: Alone Prior Function Level of Independence: Independent with gait;Independent with transfers;Requires assistive device for independence   Able to Take Stairs?: Yes Driving: Yes Comments: used crutches at home Vision/Perception  Perception Perception: Within Functional Limits Praxis Praxis: Intact  Cognition Overall Cognitive Status: Within Functional Limits for tasks assessed Arousal/Alertness: Awake/alert Orientation Level: Oriented X4 Attention: Focused Focused Attention: Appears intact Memory: Appears intact Immediate Memory Recall: Sock;Blue;Bed Memory Recall Sock: Without Cue Memory Recall Blue: Without Cue Memory Recall Bed: With Cue Awareness: Appears intact Problem Solving: Appears intact Safety/Judgment: Impaired Comments: slow process Sensation Sensation Light Touch: Impaired Detail Light Touch Impaired Details: Impaired RLE;Impaired LLE (neuropathy at baseline) Proprioception: Impaired Detail Proprioception Impaired Details: Impaired RLE;Impaired LLE Coordination Gross Motor Movements are Fluid and Coordinated: Yes Fine Motor Movements are Fluid and Coordinated: No Finger Nose Finger Test: [facility] Motor  Motor Motor: Within Functional Limits Motor - Skilled Clinical Observations: WFL  Trunk/Postural Assessment  Cervical Assessment Cervical Assessment: Within Functional Limits Thoracic Assessment Thoracic Assessment: Within Functional Limits Lumbar Assessment Lumbar Assessment: Within Functional Limits Postural Control Postural Control: Deficits on evaluation  Balance Balance Balance Assessed: Yes Dynamic Sitting Balance Dynamic Sitting - Level of Assistance: 5: Stand by assistance Static Standing Balance Static Standing - Level of Assistance: 4: Min assist Extremity Assessment  RUE Assessment RUE Assessment: Within Functional Limits LUE Assessment LUE Assessment: Within Functional Limits RLE Assessment RLE Assessment: Within Functional Limits General Strength Comments: 5/5 grossly LLE Assessment LLE Assessment: Exceptions to [facility] Passive Range of Motion (PROM) Comments: L  BKA General Strength Comments: not tested due to recent [employer] Bed Mobility Roll left and right activity   Roll left and right  assist level: Minimal Assistance - Patient > 75%    Sit to lying activity   Sit to lying assist level: Minimal Assistance - Patient > 75%    Lying to sitting edge of bed activity   Lying to sitting edge of bed assist level: Minimal Assistance - Patient > 75%     Care Tool Transfers Sit to stand transfer   Sit to stand assist level: Minimal Assistance - Patient > 75%    Chair/bed transfer   Chair/bed transfer assist level: Minimal Assistance - Patient > 75%     Toilet transfer   Assist Level: Minimal Assistance - Patient > 75%    Car transfer Car transfer activity did not occur: Safety/medical concerns        Care Tool Locomotion Ambulation Ambulation activity did not occur: Safety/medical concerns        Walk 10 feet activity Walk 10 feet activity did not occur: Safety/medical concerns       Walk 50 feet with 2 turns activity Walk 50 feet with 2 turns activity did not occur: Safety/medical concerns      Walk 150 feet activity Walk 150 feet activity did not occur: Safety/medical concerns      Walk 10 feet on uneven surfaces activity Walk 10 feet on uneven surfaces activity did not occur: Safety/medical concerns      Stairs Stair activity did not occur: Safety/medical concerns        Walk up/down 1 step activity Walk up/down 1 step or curb (drop down) activity did not occur: Safety/medical concerns     Walk up/down 4 steps activity did not occuR: Safety/medical concerns  Walk up/down 4 steps activity      Walk up/down 12 steps activity Walk up/down 12 steps activity did not occur: Safety/medical concerns      Pick up small objects from floor Pick up small object from the floor (from standing position) activity did not occur: Safety/medical concerns      Wheelchair Will patient use wheelchair at discharge?:  Yes Type of Wheelchair: Manual   Wheelchair assist level: Supervision/Verbal cueing Max wheelchair distance: 150'  Wheel 50 feet with 2 turns activity   Assist Level: Supervision/Verbal cueing  Wheel 150 feet activity   Assist Level: Supervision/Verbal cueing    Refer to Care Plan for Long Term Goals  SHORT TERM GOAL WEEK 1 PT Short Term Goal 1 (Week 1): =LTG due to ELOS  Recommendations for other services: Neuropsych  Skilled Therapeutic Intervention Evaluation completed (see details above and below) with education on PT POC and goals and individual treatment initiated with focus on functional mobility assessment, orientation to rehab unit and schedule, setting pt up with appropriate equipment to be used during rehab stay, and discussion of residual limb positioning. Pt received seated in bed having just finished breakfast, agreeable to PT session. Pt reports some pain in LLE, not rated. Bed mobility min A. Sit to stand with min A to RW. Stand pivot transfer bed to w/c with min A and RW. Assisted pt with donning TED hose on RLE for edema management. Manual w/c propulsion x 150 ft with use of BUE at Supervision level. Pt left seated in w/c in room with needs in reach at end of session.  Mobility Bed Mobility Bed Mobility: Rolling Right;Rolling Left;Supine to Sit;Sit to Supine Rolling Right: Minimal Assistance - Patient > 75% Rolling Left: Minimal Assistance - Patient > 75% Supine to Sit: Minimal Assistance - Patient > 75% Sit to Supine: Minimal  Assistance - Patient > 75% Transfers Transfers: Sit to Stand;Stand to Sit;Stand Pivot Transfers Sit to Stand: Minimal Assistance - Patient > 75% Stand to Sit: Minimal Assistance - Patient > 75% Stand Pivot Transfers: Minimal Assistance - Patient > 75% Stand Pivot Transfer Details: Verbal cues for safe use of DME/AE;Manual facilitation for weight shifting Transfer (Assistive device): Rolling walker Locomotion  Stairs / Additional  Locomotion Stairs: No Wheelchair Mobility Wheelchair Mobility: Yes Wheelchair Assistance: Chartered loss adjuster: Both upper extremities Wheelchair Parts Management: Needs assistance Distance: 150   Discharge Criteria: Patient will be discharged from PT if patient refuses treatment 3 consecutive times without medical reason, if treatment goals not met, if there is a change in medical status, if patient makes no progress towards goals or if patient is discharged from hospital.  The above assessment, treatment plan, treatment alternatives and goals were discussed and mutually agreed upon: by patient   Excell Seltzer, PT, DPT 07/01/2020, 9:59 AM

## 2020-07-01 NOTE — Progress Notes (Signed)
[place] PHYSICAL MEDICINE & REHABILITATION PROGRESS NOTE   Subjective/Complaints:   Pt reports blister R heel- and wondering when VAC will be removed- so can mobilize better and Can get shower- explained will come off Sunday- since got put on last Saturday night.   Will order TEDs for RLE due to edema.   Pain is better today than yesterday- no new BM, but will work on that today.   ROS:  Pt denies SOB, abd pain, CP, N/V/C/D, and vision changes   Objective:   No results found. Recent Labs    07/01/20 0743  WBC 5.9  HGB 8.4*  HCT 27.7*  PLT 288   Recent Labs    07/01/20 0743  NA 134*  K 4.2  CL 97*  CO2 29  GLUCOSE 111*  BUN 15  CREATININE 0.98  CALCIUM 9.5    Intake/Output Summary (Last 24 hours) at 07/01/2020 1703 Last data filed at 07/01/2020 1540 Gross per 24 hour  Intake 900 ml  Output 6125 ml  Net -5225 ml     Physical Exam: Vital Signs Blood pressure 138/76, pulse 88, temperature 100.1 F (37.8 C), temperature source Oral, resp. rate 16, height 6\' 2"  (1.88 m), weight 132 kg, SpO2 98 %.  Constitutional:  sitting up EOB, with PT in room, appropriate, slow to process, NAD HENT: conjugate gaze Cardiovascular: RRR- no M/R/G Pulmonary: CTA B/L- no W/R/R- good air movement Abdominal: Soft, NT, ND, (+)BS  Musculoskeletal:        General: Swelling (2+ RLE) present. RLE-      Cervical back: Normal range of motion and neck supple.     Comments: RLE with small healed abrasions. Scab top of 2nd digit and blister on heel Callused laceration plantar surface under great toe. Small dark ulcer on third toe and on heel. VAC on L BKA with limb guard in place UEs 5/5 except L finger abd which is not testable due to 5th digit contracture LEs- RLE- HF 4-/5, KE/KF 5-/5; DF and PF 5/5 LLE- at least 3/5- too painful to test against resistance  Skin: IV L forearm and R forearm- OK Little abrasions R calf and R knee \\wound  VAC on LLE  Neurological: Ox3    Comments:  Sensation to light touch intact except stocking AND glove pattern B/L  Psychiatric:    appropriate- slow to process    Assessment/Plan: 1. Functional deficits secondary to L BKA which require 3+ hours per day of interdisciplinary therapy in a comprehensive inpatient rehab setting.  Physiatrist is providing close team supervision and 24 hour management of active medical problems listed below.  Physiatrist and rehab team continue to assess barriers to discharge/monitor patient progress toward functional and medical goals  Care Tool:  Bathing    Body parts bathed by patient: Right arm, Left arm, Chest, Abdomen, Front perineal area, Right upper leg, Left upper leg, Face   Body parts bathed by helper: Buttocks, Right lower leg Body parts n/a: Left lower leg   Bathing assist Assist Level: Minimal Assistance - Patient > 75%     Upper Body Dressing/Undressing Upper body dressing   What is the patient wearing?: Pull over shirt    Upper body assist Assist Level: Set up assist    Lower Body Dressing/Undressing Lower body dressing      What is the patient wearing?: Pants     Lower body assist Assist for lower body dressing: Moderate Assistance - Patient 50 - 74%     Toileting Toileting  Toileting assist Assist for toileting: Independent with assistive device Assistive Device Comment: urinal   Transfers Chair/bed transfer  Transfers assist     Chair/bed transfer assist level: Minimal Assistance - Patient > 75%     Locomotion Ambulation   Ambulation assist   Ambulation activity did not occur: Safety/medical concerns          Walk 10 feet activity   Assist  Walk 10 feet activity did not occur: Safety/medical concerns        Walk 50 feet activity   Assist Walk 50 feet with 2 turns activity did not occur: Safety/medical concerns         Walk 150 feet activity   Assist Walk 150 feet activity did not occur: Safety/medical concerns          Walk 10 feet on uneven surface  activity   Assist Walk 10 feet on uneven surfaces activity did not occur: Safety/medical concerns         Wheelchair     Assist Will patient use wheelchair at discharge?: Yes Type of Wheelchair: Manual    Wheelchair assist level: Supervision/Verbal cueing Max wheelchair distance: 150'    Wheelchair 50 feet with 2 turns activity    Assist        Assist Level: Supervision/Verbal cueing   Wheelchair 150 feet activity     Assist      Assist Level: Supervision/Verbal cueing   Blood pressure 138/76, pulse 88, temperature 100.1 F (37.8 C), temperature source Oral, resp. rate 16, height 6\' 2"  (1.88 m), weight 132 kg, SpO2 98 %.  Medical Problem List and Plan: 1.  L BKA and impaired function secondary to LLE osteomyelitis with impaired gait- has Wound VAC on LLE             -patient may not shower until wound VAC is removed  9/1- wound VAC can come off Sunday morning- not off until then- explained to pT and pt.              -ELOS/Goals: 10-14 days- mod I to supervision 2.  Antithrombotics: -DVT/anticoagulation:  Pharmaceutical: Lovenox             -antiplatelet therapy: N/a 3. Chronic pain/Pain Management: PDMP reviewed--PTA on Morphine ER 30 mg bid/Oxycodone 30 mg qid and Xanax 1 mg qid  9/1- pt reports pain doing better this AM- con't regimen 4. Mood: LCSW to follow for evaluation and support.              -antipsychotic agents: N/A 5. Neuropsych: This patient is capable of making decisions on his own behalf. 6. Skin/Wound Care: Monitor wound for healing. Add protein supplement to promote healing.  7. Fluids/Electrolytes/Nutrition: Monitor I/O. Check lytes in am.  8. HTN: Monitor BP tid--continue lasix, hydralazine TID and lisinopril.  9. Acute on chronic anemia: S/p 1 unit PRBC 8/24--> hgb 7.8 and stable. Resume iron supplement.  10. T2DM: Hgb A1c- 4.7 on Amaryl 4 mg po PTA. Will resume and titrate as indicated. Continue  to monitor BS ac/hs and use SSI as needed. Will  d/c lantus tomorrow.   CBG (last 3)  Recent Labs    07/01/20 1143 07/01/20 1621 07/01/20 1648  GLUCAP 152* 60* 111*    9/1- 1 BG of 60- today- otherwise, well controlled- was before dinner- getting rid of Lantus today- if no better, tomorrow, will decrease Amaryl 11. Diabetic peripheral neuropathy: On elavil 100 mg, cymbalta, gabapentin (900/900/1200) 12. ETOH abuse/B 12 deficiency: On thiamine  and folic acid. Add vitamin B 12.  13. Hyponatremia/Pre-renal azotemia: Improving. Continue to follow with serial checks.   9/1- Cr <1 now- doing well    LOS: 1 days A FACE TO FACE EVALUATION WAS PERFORMED  Magnolia Mattila 07/01/2020, 5:03 PM

## 2020-07-02 ENCOUNTER — Inpatient Hospital Stay (HOSPITAL_COMMUNITY): Payer: Medicaid Other

## 2020-07-02 ENCOUNTER — Inpatient Hospital Stay (HOSPITAL_COMMUNITY): Payer: Medicaid Other | Admitting: Physical Therapy

## 2020-07-02 LAB — GLUCOSE, CAPILLARY
Glucose-Capillary: 91 mg/dL (ref 70–99)
Glucose-Capillary: 67 mg/dL — ABNORMAL LOW (ref 70–99)
Glucose-Capillary: 81 mg/dL (ref 70–99)
Glucose-Capillary: 130 mg/dL — ABNORMAL HIGH (ref 70–99)

## 2020-07-02 MED ORDER — SORBITOL 70 % SOLN
30.0000 mL | Freq: Once | Status: AC
  Administered 2020-07-02: 30 mL via ORAL
  Filled 2020-07-02: qty 30

## 2020-07-02 NOTE — Progress Notes (Signed)
Patient resting at interval throughout shift, states pain is manageable aware or ordered medication as needed,, Wound Vac to left lower extremity, sensitive to touch, surgical wound(s/p BKA), with  no drainage noted in cannister,Left limb guard in place, continue wound vac  settings per orders, refer to assessment data she as needed, Made as comfortable as possible, monitor and assisted

## 2020-07-02 NOTE — Progress Notes (Signed)
Occupational Therapy Session Note  Patient Details  Name: Eugene Diaz MRN: [id number] Date of Birth: 1966-01-26  Today's Date: 07/02/2020 OT Individual Time: 0900-1000 OT Individual Time Calculation (min): 60 min    Short Term Goals: Week 1:  OT Short Term Goal 1 (Week 1): STG=LTG d/t ELOS  Skilled Therapeutic Interventions/Progress Updates:    Pt resting in bed upon arrival and ready to get OOB and wash up/change clothing. Supine>sit EOB with supervision. Squat/scoot transfer to w/c with min A.  Pt completed UB bathing/dressing task with setup/supervisoin. Sit<>stand at sink for LB hygiene/bathing with min A and min A for standing balance. Pt donned limb protector with min verbal cues. Pt requested to use toilet and performed stand pivot transfer using grab bars with min A. Toileting with CGA during clothing management. Pt remained seated in w/c with all needs within reach and belt alarm activated.   Therapy Documentation Precautions:  Precautions Precautions: Fall Required Braces or Orthoses: Other Brace Other Brace: Limb protector Restrictions Weight Bearing Restrictions: Yes LLE Weight Bearing: Non weight bearing  Pain:  Pt c/o 5/10 pain in LLE (phantom); increased to 7/10 with activity   Therapy/Group: Individual Therapy  [name] 07/02/2020, 12:08 PM

## 2020-07-02 NOTE — Progress Notes (Signed)
Physical Therapy Session Note  Patient Details  Name: [name] MRN: [id number] Date of Birth: [birth year]  Today's Date: 07/02/2020 PT Individual Time: 1100-1200 PT Individual Time Calculation (min): 60 min   Short Term Goals: Week 1:  PT Short Term Goal 1 (Week 1): =LTG due to ELOS  Skilled Therapeutic Interventions/Progress Updates:    pt received in [facility] and agreeable to therapy,pt left in same position at end of session. Pt taken to gym total A for time management. Pt directed in stand pivot transfer to mat table with RW min A. Pt directed in x5 STS from EOM to RW at min A_CGA with Vc for hand placement and directed in standing for 45s each, 2 mins final rep at [facility] with VC for increased R knee extension and pt required extra time 2/2 fatigue and need of rest breaks. Pt directed in gait training with RW with hopping technique for 50', 70' min A-CGA with VC for R knee extension during stance before prepping for advancing hop, trunk extension, and increased support with BUE on RW. Final rep of gait 53' with RW pt demonstrated and education on swing technique with RLE instead of full hop for energy conservation and pt initially demonstrated good carry over and improved skill of gait training however pt reported he did not like this and felt he was "swinging too far forward" and returned to hopping technique with increased comfort at this time, continued to be CGA-min A for safety. Pt directed in seated BLE strengthening ex of marching and LAQ on RLE with 4# 2x20 each and leg lifts on L residual limb un weighted. Pt reported this increased pain at LLE and activity halted and pt returned to room at end of session, remaining in [facility], All needs in reach and in good condition. Call light in hand.  And safety belt set. Pt reported 8/10 pain at LLE and nursing made aware. Pt positioned for comfort at end of session.   Therapy Documentation Precautions:  Precautions Precautions: Fall Required Braces or  Orthoses: Other Brace Other Brace: Limb protector Restrictions Weight Bearing Restrictions: Yes LLE Weight Bearing: Non weight bearing Pain: Pain Assessment Pain Score: 8        Therapy/Group: Individual Therapy  [name] 07/02/2020, 2:57 PM

## 2020-07-02 NOTE — Progress Notes (Signed)
Physical Therapy Session Note  Patient Details  Name: Eugene Diaz MRN: [id number] Date of Birth: July 09, 1966  Today's Date: 07/02/2020 PT Individual Time: 6433-2951 PT Individual Time Calculation (min): 71 min   Short Term Goals: Week 1:  PT Short Term Goal 1 (Week 1): =LTG due to ELOS  Skilled Therapeutic Interventions/Progress Updates: Pt presents sitting in w/c and eager for therapy.  Pt states need to use BR and wheeled to BR w/ assist over threshhold.  Pt transferred sit to stand w/ min A and uses hand rail to turn to toilet.  Pt able to manage clothing w/ min A.  Pt performed all pericare and then sit to stand w/ min A and RW to transfer to w/c.  Pt voided 250 ml of urine in urinal and states BM although flushed before seen.  Spoke w/ NT and will chart.   Pt negotiated w/c out of room and through hallways including on/off elevators w/ supervision to outside, w/ occasional rest breaks, but able to tolerated > 150' per trial.  Pt performed multiple sit to stand transfers w/ min A and amb approx. 5' forward and back x 3-4 trials w/ min A and verbal cues for sequencing and walker management.  Pt then able to amb up to 20' w/ Min A w/ +2 for w/c management 2/2 wound vac.  Pt became somewhat teary when explaining hx of present injury 2/2 loss of wife.  Pt negotiated w/c on uneven surfaces outside before returning inside and negotiating elevator (backing in) and returned to room.  Chair alarm on and all needs in reach.       Therapy Documentation Precautions:  Precautions Precautions: Fall Required Braces or Orthoses: Other Brace Other Brace: Limb protector Restrictions Weight Bearing Restrictions: Yes LLE Weight Bearing: Non weight bearing General:   Vital Signs: Therapy Vitals Temp: 98.3 F (36.8 C) Pulse Rate: (!) 109 Resp: 16 BP: (!) 168/80 Patient Position (if appropriate): Sitting Oxygen Therapy SpO2: 98 % O2 Device: Room Air Pain: 0/10 pain, stiff. Pain Assessment Pain  Score: 8     Therapy/Group: Individual Therapy  [name] 07/02/2020, 2:29 PM

## 2020-07-02 NOTE — Progress Notes (Signed)
[place] PHYSICAL MEDICINE & REHABILITATION PROGRESS NOTE   Subjective/Complaints:   Pt reports he doesn't remember when VAC is to come off- explained will be Sunday.   Waiting currently on therapy Pain 5 to 5.5/10 this am- tolerable.   No BM in 3 days- which is pretty normal for him, but explained we want him to go at least 2x/week. Will order sorbitol for him.    ROS:  Pt denies SOB, abd pain, CP, N/V/C/D, and vision changes   Objective:   No results found. Recent Labs    07/01/20 0743  WBC 5.9  HGB 8.4*  HCT 27.7*  PLT 288   Recent Labs    07/01/20 0743  NA 134*  K 4.2  CL 97*  CO2 29  GLUCOSE 111*  BUN 15  CREATININE 0.98  CALCIUM 9.5    Intake/Output Summary (Last 24 hours) at 07/02/2020 0919 Last data filed at 07/02/2020 0801 Gross per 24 hour  Intake 840 ml  Output 4500 ml  Net -3660 ml     Physical Exam: Vital Signs Blood pressure (!) 148/84, pulse 73, temperature 98.2 F (36.8 C), resp. rate 16, height 6\' 2"  (1.88 m), weight 132 kg, SpO2 98 %.  Constitutional:  sitting up in bed- appropriate, watching TV, NAD HENT: conjugate gaze Cardiovascular: RRR Pulmonary: CTA B/L- no W/R/R- good air movement Abdominal: .soft, NT, somewhat distended; hypoactive BS Musculoskeletal:        General: Swelling (2+ RLE) present. RLE- no significant change     Cervical back: Normal range of motion and neck supple.     Comments: RLE with small healed abrasions. Scab top of 2nd digit and blister on heel Callused laceration plantar surface under great toe. Small dark ulcer on third toe and on heel. VAC on L BKA with limb guard in place UEs 5/5 except L finger abd which is not testable due to 5th digit contracture LEs- RLE- HF 4-/5, KE/KF 5-/5; DF and PF 5/5 LLE- at least 3/5- too painful to test against resistance  Skin: IV L forearm and R forearm- OK Little abrasions R calf and R knee \\wound  VAC on LLE - with limb guard in place Neurological: Ox3     Comments: Sensation to light touch intact except stocking AND glove pattern B/L  Psychiatric:   appropriate, quicker to respond today    Assessment/Plan: 1. Functional deficits secondary to L BKA which require 3+ hours per day of interdisciplinary therapy in a comprehensive inpatient rehab setting.  Physiatrist is providing close team supervision and 24 hour management of active medical problems listed below.  Physiatrist and rehab team continue to assess barriers to discharge/monitor patient progress toward functional and medical goals  Care Tool:  Bathing    Body parts bathed by patient: Right arm, Left arm, Chest, Abdomen, Front perineal area, Right upper leg, Left upper leg, Face   Body parts bathed by helper: Buttocks, Right lower leg Body parts n/a: Left lower leg   Bathing assist Assist Level: Minimal Assistance - Patient > 75%     Upper Body Dressing/Undressing Upper body dressing   What is the patient wearing?: Pull over shirt    Upper body assist Assist Level: Set up assist    Lower Body Dressing/Undressing Lower body dressing      What is the patient wearing?: Pants     Lower body assist Assist for lower body dressing: Moderate Assistance - Patient 50 - 74%     Toileting Toileting  Toileting assist Assist for toileting: Independent with assistive device Assistive Device Comment: urinal   Transfers Chair/bed transfer  Transfers assist     Chair/bed transfer assist level: Minimal Assistance - Patient > 75%     Locomotion Ambulation   Ambulation assist   Ambulation activity did not occur: Safety/medical concerns          Walk 10 feet activity   Assist  Walk 10 feet activity did not occur: Safety/medical concerns        Walk 50 feet activity   Assist Walk 50 feet with 2 turns activity did not occur: Safety/medical concerns         Walk 150 feet activity   Assist Walk 150 feet activity did not occur: Safety/medical  concerns         Walk 10 feet on uneven surface  activity   Assist Walk 10 feet on uneven surfaces activity did not occur: Safety/medical concerns         Wheelchair     Assist Will patient use wheelchair at discharge?: Yes Type of Wheelchair: Manual    Wheelchair assist level: Supervision/Verbal cueing Max wheelchair distance: 150'    Wheelchair 50 feet with 2 turns activity    Assist        Assist Level: Supervision/Verbal cueing   Wheelchair 150 feet activity     Assist      Assist Level: Supervision/Verbal cueing   Blood pressure (!) 148/84, pulse 73, temperature 98.2 F (36.8 C), resp. rate 16, height 6\' 2"  (1.88 m), weight 132 kg, SpO2 98 %.  Medical Problem List and Plan: 1.  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  Antithrombotics: -DVT/anticoagulation:  Pharmaceutical: Lovenox             -antiplatelet therapy: N/a 3. Chronic pain/Pain Management: PDMP reviewed--PTA on Morphine ER 30 mg bid/Oxycodone 30 mg qid and Xanax 1 mg qid  9/1- pt reports pain doing better this AM- con't regimen  9/2- pain ~5/10 this AM- tolerable- con't regimen 4. Mood: LCSW to follow for evaluation and support.              -antipsychotic agents: N/A 5. Neuropsych: This patient is capable of making decisions on his own behalf. 6. Skin/Wound Care: Monitor wound for healing. Add protein supplement to promote healing.  7. Fluids/Electrolytes/Nutrition: Monitor I/O. Check lytes in am.  8. HTN: Monitor BP tid--continue lasix, hydralazine TID and lisinopril.  9. Acute on chronic anemia: S/p 1 unit PRBC 8/24--> hgb 7.8 and stable. Resume iron supplement.  10. T2DM: Hgb A1c- 4.7 on Amaryl 4 mg po PTA. Will resume and  titrate as indicated. Continue to monitor BS ac/hs and use SSI as needed. Will  d/c lantus tomorrow.   CBG (last 3)  Recent Labs    07/01/20 1648 07/01/20 2045 07/02/20 0550  GLUCAP 111* 147* 91    9/1- 1 BG of 60- today- otherwise, well controlled- was before dinner- getting rid of Lantus today- if no better, tomorrow, will decrease Amaryl  9/2- BGs much better in last 24 hours- con't regimen 11. Diabetic peripheral neuropathy: On  elavil 100 mg, cymbalta, gabapentin (900/900/1200) 12. ETOH abuse/B 12 deficiency: On thiamine and folic acid. Add vitamin B 12.  13. Hyponatremia/Pre-renal azotemia: Improving. Continue to follow with serial checks.   9/1- Cr <1 now- doing well 14. Constipation  9/2- will give a dose of Sorbitol and hope pt able to go.     LOS: 2 days A FACE TO FACE EVALUATION WAS PERFORMED  Eugene Diaz 07/02/2020, 9:19 AM

## 2020-07-03 ENCOUNTER — Inpatient Hospital Stay (HOSPITAL_COMMUNITY): Payer: Medicaid Other

## 2020-07-03 LAB — GLUCOSE, CAPILLARY
Glucose-Capillary: 92 mg/dL (ref 70–99)
Glucose-Capillary: 95 mg/dL (ref 70–99)
Glucose-Capillary: 82 mg/dL (ref 70–99)
Glucose-Capillary: 96 mg/dL (ref 70–99)

## 2020-07-03 MED ORDER — HYDRALAZINE HCL 50 MG PO TABS
50.0000 mg | ORAL_TABLET | Freq: Three times a day (TID) | ORAL | Status: DC
  Administered 2020-07-03 – 2020-07-10 (×21): 50 mg via ORAL
  Filled 2020-07-03 (×21): qty 1

## 2020-07-03 NOTE — Progress Notes (Signed)
Occupational Therapy Session Note  Patient Details  Name: Eugene Diaz MRN: [id number] Date of Birth: November 21, 1965  Today's Date: 07/03/2020 OT Individual Time: 0700-0800 OT Individual Time Calculation (min): 60 min    Short Term Goals: Week 1:  OT Short Term Goal 1 (Week 1): STG=LTG d/t ELOS  Skilled Therapeutic Interventions/Progress Updates:    Pt resting in bed upon arrival and ready for therapy.  Bed mobiltiy and scoot tranfser to w/c with supervision.  Bathing/dressing with sit<>stand from w/c at sink with CGA for standing balance. Wound Vac message that blockage was present. Vac lined checked at site and along tubing. Discovered that tubing was pinned between bed and unit.  Constriction relieved and vac returned to normal operation.  RN and MD notified. Pt propelled w/c to tub room and discussed home bathroom setup.  Pt amb in/out of bathroom with RW-CGA. Practiced TTB transfers X 2-supervision with min verbal cues for safety.  Pt states he will purchased TTB from [employer].  Pt also states he will have grab bars installed. Pt returned to room and remained in w/c with all needs within reach and belt alarm activated.   Therapy Documentation Precautions:  Precautions Precautions: Fall Required Braces or Orthoses: Other Brace Other Brace: Limb protector Restrictions Weight Bearing Restrictions: Yes LLE Weight Bearing: Non weight bearing  Pain: Pt c/o 6/10 LLE pain; RN aware and repositioned   Other Treatments:     Therapy/Group: Individual Therapy  [name] 07/03/2020, 9:02 AM

## 2020-07-03 NOTE — Progress Notes (Signed)
[place] PHYSICAL MEDICINE & REHABILITATION PROGRESS NOTE   Subjective/Complaints:   Pt reports doing well- pain is "tolerable", but still there- constant.   Had a BM yesterday and one overnight- feeling good.     ROS:  Pt denies SOB, abd pain, CP, N/V/C/D, and vision changes   Objective:   No results found. Recent Labs    07/01/20 0743  WBC 5.9  HGB 8.4*  HCT 27.7*  PLT 288   Recent Labs    07/01/20 0743  NA 134*  K 4.2  CL 97*  CO2 29  GLUCOSE 111*  BUN 15  CREATININE 0.98  CALCIUM 9.5    Intake/Output Summary (Last 24 hours) at 07/03/2020 5852 Last data filed at 07/03/2020 0600 Gross per 24 hour  Intake 900 ml  Output 6401 ml  Net -5501 ml     Physical Exam: Vital Signs Blood pressure (!) 160/76, pulse 75, temperature 98.2 F (36.8 C), temperature source Oral, resp. rate 17, height 6\' 2"  (1.88 m), weight 132 kg, SpO2 100 %.  Constitutional: sitting in manual w/c, in hallway working with OT, NAD HENT: conjugate gaze Cardiovascular: RRR Pulmonary: CTA B/L- no W/R/R- good air movement Abdominal: Soft, NT, ND, (+)BS  Musculoskeletal:        General: Swelling (2+ RLE) present. RLE- slightly less swollen this AM     Cervical back: Normal range of motion and neck supple.     Comments: RLE with small healed abrasions. Scab top of 2nd digit and blister on heel Callused laceration plantar surface under great toe. Small dark ulcer on third toe and on heel. VAC on L BKA with limb guard in place UEs 5/5 except L finger abd which is not testable due to 5th digit contracture LEs- RLE- HF 4-/5, KE/KF 5-/5; DF and PF 5/5 LLE- at least 3/5- too painful to test against resistance  Skin: IV L forearm and R forearm- OK Little abrasions R calf and R knee \\wound  VAC on LLE - with limb guard in place- propped up on limb elevater Neurological: Ox3    Comments: Sensation to light touch intact except stocking AND glove pattern B/L  Psychiatric: slower to respond, but  appropriate-wondering if [facility]    Assessment/Plan: 1. Functional deficits secondary to L BKA which require 3+ hours per day of interdisciplinary therapy in a comprehensive inpatient rehab setting.  Physiatrist is providing close team supervision and 24 hour management of active medical problems listed below.  Physiatrist and rehab team continue to assess barriers to discharge/monitor patient progress toward functional and medical goals  Care Tool:  Bathing    Body parts bathed by patient: Right arm, Left arm, Chest, Abdomen, Front perineal area, Buttocks, Right upper leg, Left upper leg, Face   Body parts bathed by helper: Right lower leg Body parts n/a: Left lower leg   Bathing assist Assist Level: Minimal Assistance - Patient > 75%     Upper Body Dressing/Undressing Upper body dressing   What is the patient wearing?: Pull over shirt    Upper body assist Assist Level: Set up assist    Lower Body Dressing/Undressing Lower body dressing      What is the patient wearing?: Pants     Lower body assist Assist for lower body dressing: Moderate Assistance - Patient 50 - 74%     Toileting Toileting    Toileting assist Assist for toileting: Supervision/Verbal cueing Assistive Device Comment: urinal   Transfers Chair/bed transfer  Transfers assist  Chair/bed transfer assist level: Minimal Assistance - Patient > 75%     Locomotion Ambulation   Ambulation assist   Ambulation activity did not occur: Safety/medical concerns  Assist level: Minimal Assistance - Patient > 75% Assistive device: Walker-rolling Max distance: 20   Walk 10 feet activity   Assist  Walk 10 feet activity did not occur: Safety/medical concerns  Assist level: Minimal Assistance - Patient > 75% Assistive device: Walker-rolling   Walk 50 feet activity   Assist Walk 50 feet with 2 turns activity did not occur: Safety/medical concerns         Walk 150 feet activity   Assist  Walk 150 feet activity did not occur: Safety/medical concerns         Walk 10 feet on uneven surface  activity   Assist Walk 10 feet on uneven surfaces activity did not occur: Safety/medical concerns   Assist level: Minimal Assistance - Patient > 75% Assistive device: Aeronautical engineer Will patient use wheelchair at discharge?: Yes Type of Wheelchair: Manual    Wheelchair assist level: Supervision/Verbal cueing Max wheelchair distance: >150    Wheelchair 50 feet with 2 turns activity    Assist        Assist Level: Supervision/Verbal cueing   Wheelchair 150 feet activity     Assist      Assist Level: Supervision/Verbal cueing   Blood pressure (!) 160/76, pulse 75, temperature 98.2 F (36.8 C), temperature source Oral, resp. rate 17, height 6\' 2"  (1.88 m), weight 132 kg, SpO2 100 %.  Medical Problem List and Plan: 1.  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  Antithrombotics: -DVT/anticoagulation:  Pharmaceutical: Lovenox             -antiplatelet therapy: N/a 3. Chronic pain/Pain Management: PDMP reviewed--PTA on Morphine ER 30 mg bid/Oxycodone 30 mg qid and Xanax 1 mg qid  9/1- pt reports pain doing better this AM- con't regimen  9/2- pain ~5/10 this AM- tolerable- con't regimen 4. Mood: LCSW to follow for evaluation and support.              -antipsychotic agents: N/A 5. Neuropsych: This patient is capable of making decisions on his own behalf. 6. Skin/Wound Care: Monitor wound for healing. Add protein supplement to promote healing.  7. Fluids/Electrolytes/Nutrition: Monitor I/O. Check lytes in am.  8. HTN: Monitor BP tid--continue lasix, hydralazine TID and lisinopril.   9/3-  will increase Hydralazine to 50 mg TID since BP still elevated- Lisinopril to maxed out.  9. Acute on chronic anemia: S/p 1 unit PRBC 8/24--> hgb 7.8 and stable. Resume iron supplement.  10. T2DM: Hgb A1c- 4.7 on Amaryl 4 mg po PTA. Will resume and titrate as indicated. Continue to monitor BS ac/hs and use SSI as needed. Will  d/c lantus tomorrow.   CBG (last 3)  Recent Labs    07/02/20 1637 07/02/20 2110 07/03/20 0610  GLUCAP 81 130* 95    9/3- Bgs doing great- con't regimen 11. Diabetic peripheral neuropathy: On elavil 100 mg, cymbalta, gabapentin (900/900/1200) 12. ETOH abuse/B 12 deficiency:  On thiamine and folic acid. Add vitamin B 12.  13. Hyponatremia/Pre-renal azotemia: Improving. Continue to follow with serial checks.   9/1- Cr <1 now- doing well 14. Constipation  9/2- will give a dose of Sorbitol and hope pt able to go.   9/3- pt had 2 BMs- feeling good    LOS: 3 days A FACE TO FACE EVALUATION WAS PERFORMED  Eugene Diaz 07/03/2020, 9:22 AM

## 2020-07-03 NOTE — Progress Notes (Addendum)
Physical Therapy Session Note  Patient Details  Name: MARKIES MOWATT MRN: [id number] Date of Birth: [birth year]  Today's Date: 07/03/2020 PT Individual Time: 1100-1200 and [phone] PT Individual Time Calculation (min): 60 min and 60 min  Short Term Goals: Week 1:  PT Short Term Goal 1 (Week 1): =LTG due to ELOS Week 2:    Week 3:     Skilled Therapeutic Interventions/Progress Updates:    First Session: PAIN rates at 6.5, rest breaks and elevation of limb provided as needed.  Pt initially oob in wc.  wc propulsion 164ft mod I.  Pt manages brakes and legrests w/supervision.  wc propulsion x 150 mod I.  In parallel bars worked on the following therex: Hip abd x 15 Seated rest x 107min Hip extension x 15 Seated rest x 3 min minisquats x 8  Pt educated re;  puprose of preprosthetic exercise program, acewrapping, use of shrinkers, and process of fitting for prosthesis.  Pt c/o urgent need for BR.  Transported to BR STS to commode using grab bar then wall on R for balance.  Continent of urine, CGA for balance only.  Stand to sit w/cga.  Pt uses sink to wash hands independently from wc level.   wc propulsion >177ft mod I. SPT to mat w/RW and min assist for balance.  Sit to prone w/supervision. Prone therex: Hip extension 2 x20 Prone press ups x 10  Prone to sit w/supervision. Squat pivot mat to wc w/set up, verbal cues, and assist to stabilize wc only.  Pt mod I wc propulsion >122ft to room.     Second Session PAIN 5/10 residual limb w/limb in dependent position dueing session.  Rest breaks w/elevation provide as needed.   Pt mod I wc propulsion >131ft to gym.    Overhead press 7lbs 2x20 w/bilat UEs for strengthening to improve ability to utilize AD w/gait.  STS from wc mult times throughout session w/cga.  Gait 76ft w/RW and +2 cga to min assist, r knee mild buckling w/fatigue, second person assists w/Vac as well, cues to stay safely within walker/tends to step too  closely to front.  Standing balance via retrieveing horseshoes from chair placed to either side then tossing to target first at 89ft then at 14 ft w/min assist using RW.  Repeated x 2.  Reaching to L pt moves more cautiously/slowly due to perceived instability.  Repeated sts from wc x 5 for global functional strengthenring. Gait x 66ft w/RW, min assist for balance, cues for safety as above, assist to manage vac and wc.  Pt mod I wc propulsion >180ft to room. Pt left oob in wc w/alarm belt set and needs in reach  Improving with all aspects of functional mobility, endurance, strength, balance.   Therapy Documentation Precautions:  Precautions Precautions: Fall Required Braces or Orthoses: Other Brace Other Brace: Limb protector Restrictions Weight Bearing Restrictions: Yes LLE Weight Bearing: Non weight bearing   Therapy/Group: Individual Therapy  Callie Fielding, PT   Jerrilyn Cairo 07/03/2020, 12:05 PM

## 2020-07-03 NOTE — IPOC Note (Signed)
Overall Plan of Care [facility]) Patient Details Name: Eugene Diaz MRN: [id number] DOB: 21-Jun-1966  Admitting Diagnosis: Unilateral complete BKA, left, initial encounter [facility])  Hospital Problems: Principal Problem:   Unilateral complete BKA, left, initial encounter [facility])     Functional Problem List: Nursing Motor, Nutrition, Skin Integrity, Pain, Medication Management, Endurance  PT Balance, Endurance, Motor, Pain, Safety, Sensory  OT Balance, Edema, Endurance, Pain, Safety, Sensory, Skin Integrity  SLP    TR         Basic ADL's: OT Grooming, Bathing, Dressing, Toileting     Advanced  ADL's: OT       Transfers: PT Bed Mobility, Bed to Chair, Car, [employer], Futures trader, Metallurgist: PT Ambulation, Emergency planning/management officer, Stairs     Additional Impairments: OT    SLP        TR      Anticipated Outcomes Item Anticipated Outcome  Self Feeding MOD  Swallowing      Basic self-care  MOD I  Toileting  MOD I toilet, S shower   Bathroom Transfers MODI toilet, S shower  Bowel/Bladder  Pt will manage bowel and bladder with min assist  Transfers  mod I  Locomotion  mod I with LRAD  Communication     Cognition     Pain  Pt will manage pain at 3 or less on a scale of 0-10.  Safety/Judgment  Pt will remain free of falls with injury while in rehab with min assist   Therapy Plan: PT Intensity: Minimum of 1-2 x/day ,45 to 90 minutes PT Frequency: 5 out of 7 days PT Duration Estimated Length of Stay: 7-10 days OT Intensity: Minimum of 1-2 x/day, 45 to 90 minutes OT Frequency: 5 out of 7 days OT Duration/Estimated Length of Stay: 7-10     Due to the current state of emergency, patients may not be receiving their 3-hours of Medicare-mandated therapy.   Team Interventions: Nursing Interventions Patient/Family Education, Medication Management, Pain Management, Bowel Management, Skin Care/Wound Management, Discharge Planning  PT interventions  Ambulation/gait training, Balance/vestibular training, Community reintegration, Discharge planning, DME/adaptive equipment instruction, Functional mobility training, Pain management, Patient/family education, Psychosocial support, Skin care/wound management, Splinting/orthotics, Stair training, Therapeutic Activities, Therapeutic Exercise, UE/LE Strength taining/ROM, UE/LE Coordination activities, Wheelchair propulsion/positioning  OT Interventions Balance/vestibular training, Discharge planning, Pain management, Self Care/advanced ADL retraining, UE/LE Coordination activities, Therapeutic Activities, Visual/perceptual remediation/compensation, Therapeutic Exercise, Skin care/wound managment, Patient/family education, Functional mobility training, Disease mangement/prevention, Community reintegration, Engineer, drilling, Neuromuscular re-education, Psychosocial support, Splinting/orthotics, UE/LE Strength taining/ROM, Wheelchair propulsion/positioning  SLP Interventions    TR Interventions    SW/CM Interventions Discharge Planning, Psychosocial Support, Patient/Family Education   Barriers to Discharge MD  Medical stability, Wound care, Weight and Weight bearing restrictions  Nursing Wound Care    PT Home environment access/layout, Weight bearing restrictions    OT Inaccessible home environment, Decreased caregiver support, Weight bearing restrictions    SLP      SW Decreased caregiver support, Lack of/limited family support     Team Discharge Planning: Destination: PT-Home ,OT- Home , SLP-  Projected Follow-up: PT-Home health PT, OT-  Home health OT, SLP-  Projected Equipment Needs: PT-Wheelchair cushion (measurements), Wheelchair (measurements), Other (comment) (other TBD pending progress), OT- 3 in 1 bedside comode, Tub/shower seat, To be determined, SLP-  Equipment Details: PT-TBD pending progress; 20x18 w/c, OT-  Patient/family involved in discharge planning: PT- Patient,   OT-Patient, SLP-   MD ELOS: 7-10 days Medical  Rehab Prognosis:  Good Assessment: Pt is a 54 yr old male with hx of DM, HTN, and chronic pain, admitted for osteomyelitis of L foot- had L BKA- has wound VAC in place- until Sunday- CKD with AKI is improving- and Cr now <1- also had constipation- doing better.   Goals Mod I by d/c.     See Team Conference Notes for weekly updates to the plan of care

## 2020-07-04 LAB — GLUCOSE, CAPILLARY
Glucose-Capillary: 102 mg/dL — ABNORMAL HIGH (ref 70–99)
Glucose-Capillary: 62 mg/dL — ABNORMAL LOW (ref 70–99)
Glucose-Capillary: 109 mg/dL — ABNORMAL HIGH (ref 70–99)
Glucose-Capillary: 94 mg/dL (ref 70–99)
Glucose-Capillary: 219 mg/dL — ABNORMAL HIGH (ref 70–99)

## 2020-07-04 MED ORDER — GLIMEPIRIDE 2 MG PO TABS
2.0000 mg | ORAL_TABLET | Freq: Every day | ORAL | Status: DC
  Administered 2020-07-05 – 2020-07-10 (×6): 2 mg via ORAL
  Filled 2020-07-04 (×6): qty 1

## 2020-07-04 NOTE — Progress Notes (Signed)
[place] PHYSICAL MEDICINE & REHABILITATION PROGRESS NOTE   Subjective/Complaints:   Pt reports still has IV- wants to remove Sletp well VAC to come off Sunday.   BG 62 this afternoon- will lower meds.    ROS:  Pt denies SOB, abd pain, CP, N/V/C/D, and vision changes  Objective:   No results found. No results for input(s): WBC, HGB, HCT, PLT in the last 72 hours. No results for input(s): NA, K, CL, CO2, GLUCOSE, BUN, CREATININE, CALCIUM in the last 72 hours.  Intake/Output Summary (Last 24 hours) at 07/04/2020 1601 Last data filed at 07/04/2020 1247 Gross per 24 hour  Intake 660 ml  Output 4855 ml  Net -4195 ml     Physical Exam: Vital Signs Blood pressure (!) 148/71, pulse 90, temperature 98.1 F (36.7 C), resp. rate 16, height 6\' 2"  (1.88 m), weight 132 kg, SpO2 99 %.  Constitutional: in manual w/c, in room, appropriate, NAD HENT: conjugate gaze Cardiovascular: RRR Pulmonary: CTA B/L- no W/R/R- good air movement Abdominal: Soft, NT, ND, (+)BS   Musculoskeletal:        General: Swelling (2+ RLE) present. RLE- slightly less swollen this AM - stable    Cervical back: Normal range of motion and neck supple.     Comments: RLE with small healed abrasions. Scab top of 2nd digit and blister on heel Callused laceration plantar surface under great toe. Small dark ulcer on third toe and on heel. VAC on L BKA with limb guard in place- no change UEs 5/5 except L finger abd which is not testable due to 5th digit contracture LEs- RLE- HF 4-/5, KE/KF 5-/5; DF and PF 5/5 LLE- at least 3/5- too painful to test against resistance  Skin: IV L forearm and R forearm- OK Little abrasions R calf and R knee \\wound  VAC on LLE - with limb guard in place- propped up on limb elevater Neurological: Ox3    Comments: Sensation to light touch intact except stocking AND glove pattern B/L  Psychiatric: slower to respond, but appropriate-wondering if [facility]    Assessment/Plan: 1. Functional  deficits secondary to L BKA which require 3+ hours per day of interdisciplinary therapy in a comprehensive inpatient rehab setting.  Physiatrist is providing close team supervision and 24 hour management of active medical problems listed below.  Physiatrist and rehab team continue to assess barriers to discharge/monitor patient progress toward functional and medical goals  Care Tool:  Bathing    Body parts bathed by patient: Right arm, Left arm, Chest, Abdomen, Front perineal area, Buttocks, Right upper leg, Left upper leg, Face   Body parts bathed by helper: Right lower leg Body parts n/a: Left lower leg   Bathing assist Assist Level: Minimal Assistance - Patient > 75%     Upper Body Dressing/Undressing Upper body dressing   What is the patient wearing?: Pull over shirt    Upper body assist Assist Level: Set up assist    Lower Body Dressing/Undressing Lower body dressing      What is the patient wearing?: Pants     Lower body assist Assist for lower body dressing: Moderate Assistance - Patient 50 - 74%     Toileting Toileting    Toileting assist Assist for toileting: Supervision/Verbal cueing Assistive Device Comment: urinal   Transfers Chair/bed transfer  Transfers assist     Chair/bed transfer assist level: Minimal Assistance - Patient > 75%     Locomotion Ambulation   Ambulation assist   Ambulation activity did  not occur: Safety/medical concerns  Assist level: Minimal Assistance - Patient > 75% Assistive device: Walker-rolling Max distance: 20   Walk 10 feet activity   Assist  Walk 10 feet activity did not occur: Safety/medical concerns  Assist level: Minimal Assistance - Patient > 75% Assistive device: Walker-rolling   Walk 50 feet activity   Assist Walk 50 feet with 2 turns activity did not occur: Safety/medical concerns         Walk 150 feet activity   Assist Walk 150 feet activity did not occur: Safety/medical concerns          Walk 10 feet on uneven surface  activity   Assist Walk 10 feet on uneven surfaces activity did not occur: Safety/medical concerns   Assist level: Minimal Assistance - Patient > 75% Assistive device: Aeronautical engineer Will patient use wheelchair at discharge?: Yes Type of Wheelchair: Manual    Wheelchair assist level: Independent Max wheelchair distance: >150    Wheelchair 50 feet with 2 turns activity    Assist        Assist Level: Independent   Wheelchair 150 feet activity     Assist      Assist Level: Independent   Blood pressure (!) 148/71, pulse 90, temperature 98.1 F (36.7 C), resp. rate 16, height 6\' 2"  (1.88 m), weight 132 kg, SpO2 99 %.  Medical Problem List and Plan: 1.  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  Antithrombotics: -DVT/anticoagulation:  Pharmaceutical: Lovenox             -antiplatelet therapy: N/a 3. Chronic pain/Pain Management: PDMP reviewed--PTA on Morphine ER 30 mg bid/Oxycodone 30 mg qid and Xanax 1 mg qid  9/4- pain tolerable- con't meds 4. Mood: LCSW to follow for evaluation and support.              -antipsychotic agents: N/A 5. Neuropsych: This patient is capable of making decisions on his own behalf. 6. Skin/Wound Care: Monitor wound for healing. Add protein supplement to promote healing.  9/4- remove VAC tomorrow AM  7. Fluids/Electrolytes/Nutrition: Monitor I/O. Check lytes in am.  8. HTN: Monitor BP tid--continue lasix, hydralazine TID and lisinopril.   9/3- will increase Hydralazine to 50 mg TID since BP still elevated- Lisinopril to maxed out.  9. Acute on chronic anemia: S/p 1 unit PRBC 8/24--> hgb 7.8 and stable. Resume iron  supplement.  10. T2DM: Hgb A1c- 4.7 on Amaryl 4 mg po PTA. Will resume and titrate as indicated. Continue to monitor BS ac/hs and use SSI as needed. Will  d/c lantus tomorrow.   CBG (last 3)  Recent Labs    07/04/20 0614 07/04/20 1201 07/04/20 1245  GLUCAP 102* 62* 109*    9/4- Bgs great except BG x1 to 62- will decrease Amaryl 11. Diabetic peripheral neuropathy: On elavil 100 mg, cymbalta, gabapentin (900/900/1200) 12. ETOH abuse/B 12 deficiency: On thiamine and folic acid. Add vitamin B 12.  13. Hyponatremia/Pre-renal azotemia: Improving. Continue to follow with serial checks.   9/1- Cr <1 now- doing well 14. Constipation  9/2- will give  a dose of Sorbitol and hope pt able to go.   9/3- pt had 2 BMs- feeling good  9/4- con't on regular meds    LOS: 4 days A FACE TO FACE EVALUATION WAS PERFORMED  Marco Raper 07/04/2020, 4:01 PM

## 2020-07-04 NOTE — Progress Notes (Signed)
Hypoglycemic Event  CBG: 62  Treatment: 8 oz juice/soda  Symptoms: None  Follow-up CBG: Time:1245 CBG Result:109  Possible Reasons for Event: Unknown  Comments/MD notified:yes    Eugene Diaz  [name]

## 2020-07-05 ENCOUNTER — Inpatient Hospital Stay (HOSPITAL_COMMUNITY): Payer: Medicaid Other | Admitting: Occupational Therapy

## 2020-07-05 ENCOUNTER — Inpatient Hospital Stay (HOSPITAL_COMMUNITY): Payer: Medicaid Other

## 2020-07-05 ENCOUNTER — Encounter (HOSPITAL_COMMUNITY): Payer: Medicaid Other | Admitting: Occupational Therapy

## 2020-07-05 LAB — GLUCOSE, CAPILLARY
Glucose-Capillary: 172 mg/dL — ABNORMAL HIGH (ref 70–99)
Glucose-Capillary: 92 mg/dL (ref 70–99)
Glucose-Capillary: 206 mg/dL — ABNORMAL HIGH (ref 70–99)
Glucose-Capillary: 137 mg/dL — ABNORMAL HIGH (ref 70–99)

## 2020-07-05 NOTE — Progress Notes (Signed)
Physical Therapy Session Note  Patient Details  Name: STRATTON VILLWOCK MRN: [id number] Date of Birth: [birth year]  Today's Date: 07/05/2020 PT Individual Time: 1300-1347 PT Individual Time Calculation (min): 47 min   Short Term Goals: Week 1:  PT Short Term Goal 1 (Week 1): =LTG due to ELOS  Skilled Therapeutic Interventions/Progress Updates:  Pt seated in wc.  He denied pain, premedicated.  Pt wearing limb protector.  W/c propulsion throughout unit, independent.  VCs for managing legrests for transfer.  Stand pivot with RW w/c> mat table with min assist, R knee buckling during transfer and pt sitting suddenly. Pt unable to state what happened; " I have bad knees."  Squat pivot transfer mat> wc with stand by assistance, cues for hand placement to facilitate head/hips relationship.  Sit> stand in // with stand by assistance, with pt pulling up on bars due to width of shoulders/body habitus.  Therapeutic exercises performed with LEs to increase strength for functional mobility: prone: R/L isolated hip extension, 10 x 2 .  Standing in //:  10 x 1 R mini squats, L hip extension, L hip abduction. Seated- 10 x 1 w/c boosts/push ups using R foot on floor.  At end of session, pt seated in w/c with seat belt alarm set and needs at hand.  Wound vac plugged into wall.     Therapy Documentation Precautions:  Precautions Precautions: Fall Required Braces or Orthoses: Other Brace Other Brace: Limb protector Restrictions Weight Bearing Restrictions: Yes LLE Weight Bearing: Non weight bearing      Therapy/Group: Individual Therapy  Sundra Haddix 07/05/2020, 5:24 PM

## 2020-07-05 NOTE — Progress Notes (Signed)
Wound vac d/c per order, patient tolerated procedure well, staples intact except for one, MD notified no new order given. We continue to monitor.

## 2020-07-05 NOTE — Progress Notes (Signed)
Occupational Therapy Session Note  Patient Details  Name: Eugene Diaz MRN: [id number] Date of Birth: August 19, 1966  Today's Date: 07/05/2020 OT Group Time: 1115-1200 OT Group Time Calculation (min): 45 min  Skilled Therapeutic Interventions/Progress Updates:    Pt engaged in therapeutic w/c level dance group focusing on patient choice, UE/LE strengthening, salience, activity tolerance, and social participation. Pt was guided through various dance-based exercises involving UEs/LEs and trunk. All music was selected by group members. Emphasis placed on general strengthening and endurance. Pt was participative during group, interacted with others, requested a zz top song, and took rest/stretch breaks when needed. At end of session he self propelled back to his room. 15 minutes missed initially due to nursing care.     Therapy Documentation Precautions:  Precautions Precautions: Fall Required Braces or Orthoses: Other Brace Other Brace: Limb protector Restrictions Weight Bearing Restrictions: Yes LLE Weight Bearing: Non weight bearing Pain: no s/s pain during tx   ADL: ADL Eating: Independent Grooming: Setup Where Assessed-Grooming: Sitting at sink Upper Body Bathing: Setup Where Assessed-Upper Body Bathing: Sitting at sink Lower Body Bathing: Minimal assistance Where Assessed-Lower Body Bathing: Sitting at sink, Standing at sink Upper Body Dressing: Setup Where Assessed-Upper Body Dressing: Sitting at sink Lower Body Dressing: Moderate assistance Where Assessed-Lower Body Dressing: Sitting at sink, Standing at sink Toileting: Moderate assistance Toilet Transfer: Minimal assistance Toilet Transfer Method: Stand pivot      Therapy/Group: Group Therapy  Eugene Diaz 07/05/2020, 12:37 PM

## 2020-07-05 NOTE — Progress Notes (Signed)
Occupational Therapy Session Note  Patient Details  Name: Eugene Diaz MRN: [id number] Date of Birth: Dec 07, 1965  Today's Date: 07/05/2020 OT Individual Time: 2229-7989   &   2119-4174 OT Individual Time Calculation (min): 60 min   &   37 min   Short Term Goals: Week 1:  OT Short Term Goal 1 (Week 1): STG=LTG d/t ELOS  Skilled Therapeutic Interventions/Progress Updates:    AM session:   Patient seated in w/c.   He states that his pain is under control.  He is able to maneuver w/c in room to complete grooming and obtain items w/c level mod I.  UB dressing mod I.  Able to propel to/from therapy gym.   Completed UB ergometer 2 x 5 minutes at level 4.  Completed w/c push ups and core/trunk strengthening activities.  He remained seated in w/c at close of session.  Call bell and tray table in reach.     PM session:    Patient seated in w/c, denies pain at this time.  Able to manage urinal independently.  Reviewed skin inspection and provided long handled mirror - he demonstrates good understanding.  Able to propel w/c to and from therapy gym mod I.  Able to manage leg rests and arm rest for sit pivot transfer to/from therapy mat with CS - good body mechanics and safety.  Completed unsupported sitting core exercises.  Returned to room at close of session, he remained seated in w/c, seat belt alarm set and callbell/tray table in reach.      Therapy Documentation Precautions:  Precautions Precautions: Fall Required Braces or Orthoses: Other Brace Other Brace: Limb protector Restrictions Weight Bearing Restrictions: Yes LLE Weight Bearing: Non weight bearing   Therapy/Group: Individual Therapy  Carlos Levering 07/05/2020, 7:26 AM

## 2020-07-05 NOTE — Progress Notes (Signed)
[place] PHYSICAL MEDICINE & REHABILITATION PROGRESS NOTE   Subjective/Complaints:   Pt reports less pain - wants to go home when possible.   Ready to get VAC off-  When taken off, called to room to assess L BKA- Pics below.    ROS:  Pt denies SOB, abd pain, CP, N/V/C/D, and vision changes   Objective:   No results found. No results for input(s): WBC, HGB, HCT, PLT in the last 72 hours. No results for input(s): NA, K, CL, CO2, GLUCOSE, BUN, CREATININE, CALCIUM in the last 72 hours.  Intake/Output Summary (Last 24 hours) at 07/05/2020 1942 Last data filed at 07/05/2020 1715 Gross per 24 hour  Intake --  Output 1750 ml  Net -1750 ml     Physical Exam: Vital Signs Blood pressure (!) 155/76, pulse 79, temperature 98.7 F (37.1 C), resp. rate 18, height 6\' 2"  (1.88 m), weight 132 kg, SpO2 97 %.  Constitutional: in manual w/c,sitting up in room, NAD HENT: conjugate gaze Cardiovascular: RRR Pulmonary: CTA B/L- no W/R/R- good air movementt Abdominal: Soft, NT, ND, (+)BS    Musculoskeletal:        General: Swelling (2+ RLE) present. RLE- slightly less swollen this AM - stable    Cervical back: Normal range of motion and neck supple.     Comments: RLE with small healed abrasions. Scab top of 2nd digit and blister on heel Callused laceration plantar surface under great toe. Small dark ulcer on third toe and on heel. VAC on L BKA with limb guard in place- no change UEs 5/5 except L finger abd which is not testable due to 5th digit contracture LEs- RLE- HF 4-/5, KE/KF 5-/5; DF and PF 5/5 LLE- at least 3/5- too painful to test against resistance  Skin: IV L forearm and R forearm- OK Little abrasions R calf and R knee Removed wound VAC/via nursing ;limb guard and dressing was in place C/D/I- however pics are as below -Lateral aspect the staples coming off and bleeding a little and medial dog ear notable with some bleeding- skin/tissue friable- a little pink  Neurological:  Ox3    Comments: Sensation to light touch intact except stocking AND glove pattern B/L  Psychiatric: still slow to respond- ?HOH         Assessment/Plan: 1. Functional deficits secondary to L BKA which require 3+ hours per day of interdisciplinary therapy in a comprehensive inpatient rehab setting.  Physiatrist is providing close team supervision and 24 hour management of active medical problems listed below.  Physiatrist and rehab team continue to assess barriers to discharge/monitor patient progress toward functional and medical goals  Care Tool:  Bathing    Body parts bathed by patient: Right arm, Left arm, Chest, Abdomen, Front perineal area, Buttocks, Right upper leg, Left upper leg, Face   Body parts bathed by helper: Right lower leg Body parts n/a: Left lower leg   Bathing assist Assist Level: Minimal Assistance - Patient > 75%     Upper Body Dressing/Undressing Upper body dressing   What is the patient wearing?: Pull over shirt    Upper body assist Assist Level: Set up assist    Lower Body Dressing/Undressing Lower body dressing      What is the patient wearing?: Pants     Lower body assist Assist for lower body dressing: Moderate Assistance - Patient 50 - 74%     Toileting Toileting    Toileting assist Assist for toileting: Supervision/Verbal cueing Assistive Device Comment: urinal  Transfers Chair/bed transfer  Transfers assist     Chair/bed transfer assist level: Minimal Assistance - Patient > 75%     Locomotion Ambulation   Ambulation assist   Ambulation activity did not occur: Safety/medical concerns  Assist level: Minimal Assistance - Patient > 75% Assistive device: Walker-rolling Max distance: 20   Walk 10 feet activity   Assist  Walk 10 feet activity did not occur: Safety/medical concerns  Assist level: Minimal Assistance - Patient > 75% Assistive device: Walker-rolling   Walk 50 feet activity   Assist Walk 50 feet  with 2 turns activity did not occur: Safety/medical concerns         Walk 150 feet activity   Assist Walk 150 feet activity did not occur: Safety/medical concerns         Walk 10 feet on uneven surface  activity   Assist Walk 10 feet on uneven surfaces activity did not occur: Safety/medical concerns   Assist level: Minimal Assistance - Patient > 75% Assistive device: Aeronautical engineer Will patient use wheelchair at discharge?: Yes Type of Wheelchair: Manual    Wheelchair assist level: Independent Max wheelchair distance: 150    Wheelchair 50 feet with 2 turns activity    Assist        Assist Level: Independent   Wheelchair 150 feet activity     Assist      Assist Level: Independent   Blood pressure (!) 155/76, pulse 79, temperature 98.7 F (37.1 C), resp. rate 18, height 6\' 2"  (1.88 m), weight 132 kg, SpO2 97 %.  Medical Problem List and Plan: 1.  L BKA and impaired function secondary to LLE osteomyelitis with impaired gait- has Wound VAC on LLE             -patient may not shower until wound VAC is removed  9/2- wound VAC can come off Sunday morning- went over again with pt  9/5- staples coming out on lateral aspect- is bleeding slightly- is pink, friable tissue- needs to be assessed Monday!             -ELOS/Goals: 10-14 days- mod I to supervision 2.  Antithrombotics: -DVT/anticoagulation:  Pharmaceutical: Lovenox             -antiplatelet therapy: N/a 3. Chronic pain/Pain Management: PDMP reviewed--PTA on Morphine ER 30 mg bid/Oxycodone 30 mg qid and Xanax 1 mg qid  9/4- pain tolerable- con't meds 4. Mood: LCSW to follow for evaluation and support.              -antipsychotic agents: N/A 5. Neuropsych: This patient is capable of making decisions on his own behalf. 6. Skin/Wound Care: Monitor wound for healing. Add protein supplement to promote healing.  9/4- remove VAC tomorrow AM  9/5- ordered tegaderm and  dressing changes daily and prn  7. Fluids/Electrolytes/Nutrition: Monitor I/O. Check lytes in am.  8. HTN: Monitor BP tid--continue lasix, hydralazine TID and lisinopril.   9/4- will increase Hydralazine to 50 mg TID since BP still elevated- Lisinopril to maxed out.   9/5- slightly better- 976B sytolic- will con't for a few days 9. Acute on chronic anemia: S/p 1 unit PRBC 8/24--> hgb 7.8 and stable. Resume iron supplement.  10. T2DM: Hgb A1c- 4.7 on Amaryl 4 mg po PTA. Will resume and titrate as indicated. Continue to monitor BS ac/hs and use SSI as needed. Will  d/c lantus tomorrow.   CBG (last 3)  Recent Labs  07/05/20 0602 07/05/20 1239 07/05/20 1711  GLUCAP 172* 92 206*    9/4- Bgs great except BG x1 to 62- will decrease Amaryl  9/5- BGs not quite as good since decreased Amaryl, but at least not hypoglycemic 11. Diabetic peripheral neuropathy: On elavil 100 mg, cymbalta, gabapentin (900/900/1200) 12. ETOH abuse/B 12 deficiency: On thiamine and folic acid. Add vitamin B 12.  13. Hyponatremia/Pre-renal azotemia: Improving. Continue to follow with serial checks.   9/1- Cr <1 now- doing well 14. Constipation  9/2- will give a dose of Sorbitol and hope pt able to go.   9/3- pt had 2 BMs- feeling good  9/4- con't on regular meds    LOS: 5 days A FACE TO FACE EVALUATION WAS PERFORMED  Eugene Diaz 07/05/2020, 7:42 PM

## 2020-07-06 ENCOUNTER — Inpatient Hospital Stay (HOSPITAL_COMMUNITY): Payer: Medicaid Other | Admitting: Physical Therapy

## 2020-07-06 ENCOUNTER — Inpatient Hospital Stay (HOSPITAL_COMMUNITY): Payer: Medicaid Other | Admitting: Occupational Therapy

## 2020-07-06 LAB — BASIC METABOLIC PANEL
Anion gap: 8 (ref 5–15)
BUN: 17 mg/dL (ref 6–20)
CO2: 26 mmol/L (ref 22–32)
Calcium: 9.5 mg/dL (ref 8.9–10.3)
Chloride: 101 mmol/L (ref 98–111)
Creatinine, Ser: 1.07 mg/dL (ref 0.61–1.24)
GFR calc Af Amer: >60 mL/min (ref >60)
GFR calc non Af Amer: >60 mL/min (ref >60)
Glucose, Bld: 137 mg/dL — ABNORMAL HIGH (ref 70–99)
Potassium: 4.5 mmol/L (ref 3.5–5.1)
Sodium: 135 mmol/L (ref 135–145)

## 2020-07-06 LAB — CBC
HCT: 26.1 % — ABNORMAL LOW (ref 39.0–52.0)
Hemoglobin: 8 g/dL — ABNORMAL LOW (ref 13.0–17.0)
MCH: 26.3 pg (ref 26.0–34.0)
MCHC: 30.7 g/dL (ref 30.0–36.0)
MCV: 85.9 fL (ref 80.0–100.0)
Platelets: 199 10*3/uL (ref 150–400)
RBC: 3.04 MIL/uL — ABNORMAL LOW (ref 4.22–5.81)
RDW: 15.8 % — ABNORMAL HIGH (ref 11.5–15.5)
WBC: 4.4 10*3/uL (ref 4.0–10.5)
nRBC: 0 % (ref 0.0–0.2)

## 2020-07-06 LAB — GLUCOSE, CAPILLARY
Glucose-Capillary: 143 mg/dL — ABNORMAL HIGH (ref 70–99)
Glucose-Capillary: 81 mg/dL (ref 70–99)
Glucose-Capillary: 189 mg/dL — ABNORMAL HIGH (ref 70–99)
Glucose-Capillary: 158 mg/dL — ABNORMAL HIGH (ref 70–99)

## 2020-07-06 NOTE — Progress Notes (Signed)
[facility] PHYSICAL MEDICINE & REHABILITATION PROGRESS NOTE   Subjective/Complaints: No sig stump pain , good session with PT, no dressing change since yesterday  ROS:  Pt denies SOB, abd pain, CP, N/V/D   Objective:   No results found. Recent Labs    07/06/20 0642  WBC 4.4  HGB 8.0*  HCT 26.1*  PLT 199   Recent Labs    07/06/20 0642  NA 135  K 4.5  CL 101  CO2 26  GLUCOSE 137*  BUN 17  CREATININE 1.07  CALCIUM 9.5    Intake/Output Summary (Last 24 hours) at 07/06/2020 1128 Last data filed at 07/06/2020 0700 Gross per 24 hour  Intake 240 ml  Output 3200 ml  Net -2960 ml     Physical Exam: Vital Signs Blood pressure (!) 156/82, pulse 69, temperature 98.7 F (37.1 C), temperature source Oral, resp. rate 18, height 6\' 2"  (1.88 m), weight 132 kg, SpO2 99 %.   General: No acute distress Mood and affect are appropriate Heart: Regular rate and rhythm no rubs murmurs or extra sounds Lungs: Clear to auscultation, breathing unlabored, no rales or wheezes Abdomen: Positive bowel sounds, soft nontender to palpation, nondistended Extremities: No clubbing, cyanosis, or edema    UEs 5/5 except L finger abd which is not testable due to 5th digit contracture LEs- RLE- HF 4-/5, KE/KF 5-/5; DF and PF 5/5 LLE- at least 3/5- too painful to test against resistance  Skin: IV L forearm and R forearm- OK Little abrasions R calf and R knee Removed wound VAC/via nursing ;limb guard and dressing was in place C/D/I- however pics are as below -Lateral aspect the staples coming off and bleeding a little and medial dog ear notable with some bleeding- skin/tissue friable- a little pink  Neurological: Ox3    Comments: Sensation to light touch intact except stocking AND glove pattern B/L  Psychiatric: still slow to respond- ?HOH          Bottom two images 9/6 medial with scab sm amt sersang drainage on telfa   Assessment/Plan: 1. Functional deficits secondary to L BKA which  require 3+ hours per day of interdisciplinary therapy in a comprehensive inpatient rehab setting.  Physiatrist is providing close team supervision and 24 hour management of active medical problems listed below.  Physiatrist and rehab team continue to assess barriers to discharge/monitor patient progress toward functional and medical goals  Care Tool:  Bathing    Body parts bathed by patient: Right arm, Left arm, Chest, Abdomen, Front perineal area, Buttocks, Right upper leg, Left upper leg, Face   Body parts bathed by helper: Right lower leg Body parts n/a: Left lower leg   Bathing assist Assist Level: Minimal Assistance - Patient > 75%     Upper Body Dressing/Undressing Upper body dressing   What is the patient wearing?: Pull over shirt    Upper body assist Assist Level: Set up assist    Lower Body Dressing/Undressing Lower body dressing      What is the patient wearing?: Pants     Lower body assist Assist for lower body dressing: Moderate Assistance - Patient 50 - 74%     Toileting Toileting    Toileting assist Assist for toileting: Supervision/Verbal cueing Assistive Device Comment: urinal   Transfers Chair/bed transfer  Transfers assist     Chair/bed transfer assist level: Supervision/Verbal cueing     Locomotion Ambulation   Ambulation assist   Ambulation activity did not occur: Safety/medical concerns  Assist  level: Minimal Assistance - Patient > 75% Assistive device: Walker-rolling Max distance: 20   Walk 10 feet activity   Assist  Walk 10 feet activity did not occur: Safety/medical concerns  Assist level: Minimal Assistance - Patient > 75% Assistive device: Walker-rolling   Walk 50 feet activity   Assist Walk 50 feet with 2 turns activity did not occur: Safety/medical concerns         Walk 150 feet activity   Assist Walk 150 feet activity did not occur: Safety/medical concerns         Walk 10 feet on uneven surface    activity   Assist Walk 10 feet on uneven surfaces activity did not occur: Safety/medical concerns   Assist level: Minimal Assistance - Patient > 75% Assistive device: Aeronautical engineer Will patient use wheelchair at discharge?: Yes Type of Wheelchair: Manual    Wheelchair assist level: Independent Max wheelchair distance: 200'    Wheelchair 50 feet with 2 turns activity    Assist        Assist Level: Independent   Wheelchair 150 feet activity     Assist      Assist Level: Independent   Blood pressure (!) 156/82, pulse 69, temperature 98.7 F (37.1 C), temperature source Oral, resp. rate 18, height 6\' 2"  (1.88 m), weight 132 kg, SpO2 99 %.  Medical Problem List and Plan: 1.  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  Antithrombotics: -DVT/anticoagulation:  Pharmaceutical: Lovenox             -antiplatelet therapy: N/a 3. Chronic pain/Pain Management: PDMP reviewed--PTA on Morphine ER 30 mg bid/Oxycodone 30 mg qid and Xanax 1 mg qid  9/4- pain tolerable- con't meds 4. Mood: LCSW to follow for evaluation and support.              -antipsychotic agents: N/A 5. Neuropsych: This patient is capable of making decisions on his own behalf. 6. Skin/Wound Care: Monitor wound for healing. Add protein supplement to promote healing.  9/4- remove VAC tomorrow AM  9/5- ordered tegaderm and dressing changes daily and prn  7. Fluids/Electrolytes/Nutrition: Monitor I/O. Check lytes in am.  8. HTN: Monitor BP tid--continue lasix, hydralazine TID and lisinopril.   9/4- will increase Hydralazine to 50 mg TID since BP still elevated- Lisinopril to maxed out.   9/5- slightly better- 448J sytolic- will con't for a few days 9. Acute on chronic anemia: S/p 1 unit PRBC  8/24--> hgb 7.8 and stable.8.0 today                      10. T2DM: Hgb A1c- 4.7 on Amaryl 4 mg po PTA. Will resume and titrate as indicated. Continue to monitor BS ac/hs and use SSI as needed. Will  d/c lantus tomorrow.   CBG (last 3)  Recent Labs    07/05/20 1711 07/05/20 2102 07/06/20 0605  GLUCAP 206* 137* 143*   9/6 fair control  11. Diabetic peripheral neuropathy: On elavil 100 mg, cymbalta, gabapentin (900/900/1200) 12. ETOH abuse/B 12 deficiency: On thiamine and folic acid. Add vitamin B 12.  13. Hyponatremia/Pre-renal azotemia: Improving. Continue to  follow with serial checks.   9/1- Cr <1 now- doing well 14. Constipation  9/2- will give a dose of Sorbitol and hope pt able to go.   9/3- pt had 2 BMs- feeling good  9/4- con't on regular meds    LOS: 6 days A FACE TO [place] E Myking Sar 07/06/2020, 11:28 AM

## 2020-07-06 NOTE — Progress Notes (Signed)
Occupational Therapy Session Note  Patient Details  Name: Eugene Diaz MRN: [id number] Date of Birth: June 19, 1966  Today's Date: 07/06/2020 OT Individual Time: 0945-1030 and 1145 -1155 OT Individual Time Calculation (min): 45 min and 10 min   Short Term Goals: Week 1:  OT Short Term Goal 1 (Week 1): STG=LTG d/t ELOS      Skilled Therapeutic Interventions/Progress Updates:    Visit 1:  Pain: 5/10 - premedicated Pt received in wc and stated he was dressed and washed up for the day.  Pt agreeable to working on transfers with Rw as he will need to be mod I for home.  Pt self propelled to gym and without any cues, set up w/c, locked brakes, used RW to transfer to mat.  From mat worked on UE tricep and shoulder strengthening with Level 4 theraband,  Completed 20 reps of each exercise.  Pt talked frequently about his wife's passing and how he has really struggled the last year with that.    Pt's PT arrived for his next session.  Visit 2:  Pain: 5/10 - pt received in room with L limb unwrapped and pt waiting for nursing to rewrap his leg.  During that 10 min timeframe he worked with Level 4 band on upper back exercises of rows and lat pull downs.  20 x each exercise.  RNs arrived to room to wrap his leg.   Therapy Documentation Precautions:  Precautions Precautions: Fall Required Braces or Orthoses: Other Brace Other Brace: Limb protector Restrictions Weight Bearing Restrictions: Yes LLE Weight Bearing: Non weight bearing  Pain: Pain Assessment Pain Scale: 0-10 Pain Score: 5  Pain Type: Acute pain;Surgical pain Pain Location: Leg Pain Orientation: Left Pain Descriptors / Indicators: Aching Pain Frequency: Intermittent Pain Onset: On-going Patients Stated Pain Goal: 2 Pain Intervention(s): Medication (See eMAR) ADL: ADL Eating: Independent Grooming: Setup Where Assessed-Grooming: Sitting at sink Upper Body Bathing: Setup Where Assessed-Upper Body Bathing: Sitting at  sink Lower Body Bathing: Minimal assistance Where Assessed-Lower Body Bathing: Sitting at sink, Standing at sink Upper Body Dressing: Setup Where Assessed-Upper Body Dressing: Sitting at sink Lower Body Dressing: Moderate assistance Where Assessed-Lower Body Dressing: Sitting at sink, Standing at sink Toileting: Moderate assistance Toilet Transfer: Minimal assistance Toilet Transfer Method: Stand pivot  Therapy/Group: Individual Therapy  Jenn[place] 07/06/2020, 10:13 AM

## 2020-07-06 NOTE — Progress Notes (Signed)
Physical Therapy Session Note  Patient Details  Name: Eugene Diaz MRN: [id number] Date of Birth: Dec 14, 1965  Today's Date: 07/06/2020 PT Individual Time: 1030-1101 PT Individual Time Calculation (min): 31 min   Short Term Goals: Week 1:  PT Short Term Goal 1 (Week 1): =LTG due to ELOS  Skilled Therapeutic Interventions/Progress Updates:    pt received in gym on mat table with previous therapy session finishing and agreeable to therapy. Pt directed in transfer training with RW to/from various surfaces x5 for improved I with functional transfers and VC intermittently for technique, CGA. Pt directed in STS transfers from EOM to RW x5 supervision with pt demonstrating good safety awareness. Pt directed in gait training with RW with WC follow for 100' one very brief standing rest breaks at [facility] with VC for increased knee extension directly following hopping in order to promote improved stance phase safety before attempting to do next hop. Pt directed to return to chair, taken to room in [facility] at total assist for time management. Pt left in WC, All needs in reach and in good condition. Call light in hand.  Pt denied pain at end of session.   Therapy Documentation Precautions:  Precautions Precautions: Fall Required Braces or Orthoses: Other Brace Other Brace: Limb protector Restrictions Weight Bearing Restrictions: Yes LLE Weight Bearing: Non weight bearing    Therapy/Group: Individual Therapy  [name] 07/06/2020, 3:07 PM

## 2020-07-06 NOTE — Progress Notes (Signed)
Physical Therapy Session Note  Patient Details  Name: Eugene Diaz MRN: [id number] Date of Birth: 1966-01-02  Today's Date: 07/06/2020 PT Individual Time: 0800-0900; 1440-1530 PT Individual Time Calculation (min): 60 min and 50 min  Short Term Goals: Week 1:  PT Short Term Goal 1 (Week 1): =LTG due to ELOS  Skilled Therapeutic Interventions/Progress Updates:    Session 1: Pt received seated in bed, agreeable to PT session. Pt reports pain in residual limb, not rated and RN provides pain medication at beginning of session. Squat pivot transfer bed to w/c with Supervision. Assisted pt with donning TED hose and shoe on RLE. Pt reports urge to use the bathroom. Stand pivot transfer w/c to [facility] over toilet with use of grab bar at Supervision level. Pt is independent for clothing management and pericare. Manual w/c propulsion up to 200 ft with use of BUE at mod I level. Pt is independent for management of w/c parts including leg rests and armrests. Per pt report his friend can build him a ramp for his one STE prior to d/c. Reviewed safe w/c propulsion up/down ramp going forwards and backwards, pt at Supervision level for ramp navigation. Stand pivot transfer w/c to/from recliner in rehab apartment with Supervision. Reviewed safe transfer techniques to/from various types of furniture that pt has in his home with pt demonstrating good understanding and performance of safe transfers. Pt left seated in w/c in room with needs in reach at end of session.  Session 2: Pt received seated in w/c in room, agreeable to PT session. No complaints of pain. Pt at mod I level for w/c mobility. Squat pivot transfer w/c to/from mat table with Supervision. Sit to/from supine with Supervision on flat mat table. Provided pt with handout of HEP for BLE strengthening exercises: SLR, hip abd, glute sets, hip add squeeze, and modified bridges x 10-15 reps each to fatigue. Will review remainder of HEP next session. Pt left seated  in w/c in room with needs in reach at end of session.  Therapy Documentation Precautions:  Precautions Precautions: Fall Required Braces or Orthoses: Other Brace Other Brace: Limb protector Restrictions Weight Bearing Restrictions: Yes LLE Weight Bearing: Non weight bearing   Therapy/Group: Individual Therapy   Excell Seltzer, PT, DPT  07/06/2020, 9:18 AM

## 2020-07-06 NOTE — Progress Notes (Signed)
Physical Therapy Note  Patient Details  Name: Eugene Diaz MRN: [id number] Date of Birth: [birth year] Today's Date: 07/06/2020    Recommending the following equipment to increase functional independence with mobility: ~ Manual wheelchair with L amputee pad, size 20x20   Patient measurements:    Hip Width: 19"    Seat Depth (-2"): 22"    Back Height: 29"   W/c cushion: standard cushion 20x20     Excell Seltzer, PT, DPT  07/06/2020, 8:28 AM

## 2020-07-07 ENCOUNTER — Inpatient Hospital Stay (HOSPITAL_COMMUNITY): Payer: Medicaid Other | Admitting: Physical Therapy

## 2020-07-07 ENCOUNTER — Inpatient Hospital Stay (HOSPITAL_COMMUNITY): Payer: Medicaid Other

## 2020-07-07 LAB — GLUCOSE, CAPILLARY
Glucose-Capillary: 229 mg/dL — ABNORMAL HIGH (ref 70–99)
Glucose-Capillary: 155 mg/dL — ABNORMAL HIGH (ref 70–99)
Glucose-Capillary: 95 mg/dL (ref 70–99)
Glucose-Capillary: 127 mg/dL — ABNORMAL HIGH (ref 70–99)

## 2020-07-07 NOTE — Progress Notes (Signed)
Occupational Therapy Session Note  Patient Details  Name: Eugene Diaz MRN: [id number] Date of Birth: 11-29-1965  Today's Date: 07/07/2020 OT Individual Time: 1300-1345 OT Individual Time Calculation (min): 45 min    Short Term Goals: Week 1:  OT Short Term Goal 1 (Week 1): STG=LTG d/t ELOS  Skilled Therapeutic Interventions/Progress Updates:    Pt resting in w/c upon arrival.  OT intervention with focus on w/c mobility, functional transfers, sit<>stand, standing balance, and safety awareness to increase independence with BADLs. Pt propelled w/c to gym and performed squat pivot transfer to mat with supervision.  Pt supervision for w/c setup before and after transfers. Pt engaged in standing tasks using RUE and LUE with focus on reaching across midline and maintaining balance when reaching outside BOS. Pt completed activities with CGA. LOB X 1 with min A to correct. Pt issued walker and reacher bag.  Pt returned to room and remained seated in w/c with all needs within reach.   Therapy Documentation Precautions:  Precautions Precautions: Fall Required Braces or Orthoses: Other Brace Other Brace: Limb protector Restrictions Weight Bearing Restrictions: Yes LLE Weight Bearing: Non weight bearing  Pain: Pain Assessment Pain Scale: 0-10 Pain Score: 7, LLE Repositioned and RN aware   Therapy/Group: Individual Therapy  [name] 07/07/2020, 2:13 PM

## 2020-07-07 NOTE — Progress Notes (Signed)
Physical Therapy Session Note  Patient Details  Name: Eugene Diaz MRN: [id number] Date of Birth: 10-02-1966  Today's Date: 07/07/2020 PT Individual Time: 0900-1000; 1530-1600 PT Individual Time Calculation (min): 60 min and 30 min  Short Term Goals: Week 1:  PT Short Term Goal 1 (Week 1): =LTG due to ELOS  Skilled Therapeutic Interventions/Progress Updates:    Session 1: Pt received seated in w/c in room, agreeable to PT session. Pt reports 7/10 pain in L residual limb, declines intervention. Manual w/c propulsion up to 150 ft with use of BUE at mod I level. Pt is independent for management of w/c parts, requires min cueing for safe setup of w/c for transfer. Squat pivot transfer w/c to/from mat table with Supervision. Sit to supine/sidelying/prone independently on mat table. Reviewed remainder of HEP handout: sidelying L hip abd x 20 reps; prone hip extension and HS curls x 20 reps B; prone hip extensor stretch with press-up 5 x 1 min. Pt requires min cueing for correct exercise performance. Pt returns to sitting EOM independently. Seated LLE strengthening therex: marches, LAQ x 20 reps each. Pt is able to don and doff residual limb guard independently. Sit to stand with Supervision to RW. Static standing balance with intermittent UE support and min A performing ball toss against rebounder, 3 x 15 reps. Focus on decreased LE support against table in standing as well as safety awareness. Pt attempts to lunge for ball when it bounces out of reach, provided education and cueing to not attempt to catch falling ball and to maintain his balance and return to sitting with LOB. Pt demos good understanding of education regarding safety awareness. Pt becomes emotional and tearful at end of session regarding limb loss as well as his wife's recent passing. Provided emotional support and will provide information regarding limb loss support group. Pt left seated in w/c in room with needs in reach at end of  session.  Session 2: Pt received seated in w/c in room, agreeable to PT session. No complaints of pain. Pt is Supervision for toilet transfer, independent for clothing management and pericare. Manual w/c propulsion x 150 ft with use of BUE at mod I level. Demonstrated how to safely ascend/descend one curb step with RW. Pt able to navigate up/down 4" curb step with RW and min A with cues for safety and balance. Pt left seated in w/c in room with needs in reach at end of session.  Therapy Documentation Precautions:  Precautions Precautions: Fall Required Braces or Orthoses: Other Brace Other Brace: Limb protector Restrictions Weight Bearing Restrictions: Yes LLE Weight Bearing: Non weight bearing General:      Therapy/Group: Individual Therapy   Excell Seltzer, PT, DPT  07/07/2020, 10:42 AM

## 2020-07-07 NOTE — Progress Notes (Signed)
Patient ID: Eugene Diaz, male   DOB: 1966-03-28, 54 y.o.   MRN: [id number]  Sw met with pt in room to provide updates from team conference, and d/c date 9/10. Pt states his mother and sister Mickel Baas to pick him up. Preferred DME company is [employer]. Pt states he can have his mother pick up DME. No HHA preference.   SW sent HHPT/SN to [facility]. SW waiting on follow-up.   Loralee Pacas, MSW, [place] Office: [phone] Cell: [phone] Fax: [phone]

## 2020-07-07 NOTE — Progress Notes (Signed)
[facility] PHYSICAL MEDICINE & REHABILITATION PROGRESS NOTE   Subjective/Complaints:   Pt reports doing therapy and doing well - incision/L BKA looks better per pt.    ROS:   Pt denies SOB, abd pain, CP, N/V/C/D, and vision changes   Objective:   No results found. Recent Labs    07/06/20 0642  WBC 4.4  HGB 8.0*  HCT 26.1*  PLT 199   Recent Labs    07/06/20 0642  NA 135  K 4.5  CL 101  CO2 26  GLUCOSE 137*  BUN 17  CREATININE 1.07  CALCIUM 9.5    Intake/Output Summary (Last 24 hours) at 07/07/2020 0938 Last data filed at 07/07/2020 0816 Gross per 24 hour  Intake 720 ml  Output 3975 ml  Net -3255 ml     Physical Exam: Vital Signs Blood pressure (!) 157/80, pulse 75, temperature 97.6 F (36.4 C), resp. rate 18, height 6\' 2"  (1.88 m), weight 132 kg, SpO2 100 %.   General: up in manual w/c with OT, heading to gym, NAD Mood and affect are appropriate Heart: RRR Lungs: CTA B/L- no W/R/R- good air movement Abdomen: Soft, NT, ND, (+)BS  Extremities: No clubbing, cyanosis, or edema    UEs 5/5 except L finger abd which is not testable due to 5th digit contracture LEs- RLE- HF 4-/5, KE/KF 5-/5; DF and PF 5/5 LLE- at least 3/5- too painful to test against resistance  Skin: IV L forearm and R forearm- OK Little abrasions R calf and R knee Removed wound VAC/via nursing ;limb guard and dressing was in place C/D/I- however pics are as below -Lateral aspect the staples coming off and bleeding a little and medial dog ear notable with some bleeding- skin/tissue friable- a little pink  Neurological: Ox3    Comments: Sensation to light touch intact except stocking AND glove pattern B/L  Psychiatric: still slow to respond- ?HOH- no change         Bottom two images 9/6 medial with scab sm amt sersang drainage on telfa   Assessment/Plan: 1. Functional deficits secondary to L BKA which require 3+ hours per day of interdisciplinary therapy in a comprehensive  inpatient rehab setting.  Physiatrist is providing close team supervision and 24 hour management of active medical problems listed below.  Physiatrist and rehab team continue to assess barriers to discharge/monitor patient progress toward functional and medical goals  Care Tool:  Bathing    Body parts bathed by patient: Right arm, Left arm, Chest, Abdomen, Front perineal area, Buttocks, Right upper leg, Left upper leg, Face   Body parts bathed by helper: Right lower leg Body parts n/a: Left lower leg   Bathing assist Assist Level: Minimal Assistance - Patient > 75%     Upper Body Dressing/Undressing Upper body dressing   What is the patient wearing?: Pull over shirt    Upper body assist Assist Level: Set up assist    Lower Body Dressing/Undressing Lower body dressing      What is the patient wearing?: Pants     Lower body assist Assist for lower body dressing: Moderate Assistance - Patient 50 - 74%     Toileting Toileting    Toileting assist Assist for toileting: Supervision/Verbal cueing Assistive Device Comment: urinal   Transfers Chair/bed transfer  Transfers assist     Chair/bed transfer assist level: Supervision/Verbal cueing     Locomotion Ambulation   Ambulation assist   Ambulation activity did not occur: Safety/medical concerns  Assist level:  Minimal Assistance - Patient > 75% Assistive device: Walker-rolling Max distance: 20   Walk 10 feet activity   Assist  Walk 10 feet activity did not occur: Safety/medical concerns  Assist level: Minimal Assistance - Patient > 75% Assistive device: Walker-rolling   Walk 50 feet activity   Assist Walk 50 feet with 2 turns activity did not occur: Safety/medical concerns         Walk 150 feet activity   Assist Walk 150 feet activity did not occur: Safety/medical concerns         Walk 10 feet on uneven surface  activity   Assist Walk 10 feet on uneven surfaces activity did not occur:  Safety/medical concerns   Assist level: Minimal Assistance - Patient > 75% Assistive device: Aeronautical engineer Will patient use wheelchair at discharge?: Yes Type of Wheelchair: Manual    Wheelchair assist level: Independent Max wheelchair distance: 200'    Wheelchair 50 feet with 2 turns activity    Assist        Assist Level: Independent   Wheelchair 150 feet activity     Assist      Assist Level: Independent   Blood pressure (!) 157/80, pulse 75, temperature 97.6 F (36.4 C), resp. rate 18, height 6\' 2"  (1.88 m), weight 132 kg, SpO2 100 %.  Medical Problem List and Plan: 1.  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  Antithrombotics: -DVT/anticoagulation:  Pharmaceutical: Lovenox             -antiplatelet therapy: N/a 3. Chronic pain/Pain Management: PDMP reviewed--PTA on Morphine ER 30 mg bid/Oxycodone 30 mg qid and Xanax 1 mg qid  9/4- pain tolerable- con't meds 4. Mood: LCSW to follow for evaluation and support.              -antipsychotic agents: N/A 5. Neuropsych: This patient is capable of making decisions on his own behalf. 6. Skin/Wound Care: Monitor wound for healing. Add protein supplement to promote healing.  9/4- remove VAC tomorrow AM  9/5- ordered tegaderm and dressing changes daily and prn  7. Fluids/Electrolytes/Nutrition: Monitor I/O. Check lytes in am.  8. HTN: Monitor BP tid--continue lasix, hydralazine TID and lisinopril.   9/4- will increase Hydralazine to 50 mg TID since BP still elevated- Lisinopril to maxed out.   9/5- slightly better- 742V sytolic- will con't for a few days  9/7- looking better- con't regimen 9. Acute on chronic anemia: S/p 1 unit PRBC 8/24--> hgb 7.8 and stable.8.0  today                      10. T2DM: Hgb A1c- 4.7 on Amaryl 4 mg po PTA. Will resume and titrate as indicated. Continue to monitor BS ac/hs and use SSI as needed. Will  d/c lantus tomorrow.   CBG (last 3)  Recent Labs    07/06/20 1716 07/06/20 2040 07/07/20 0554  GLUCAP 189* 158* 229*   9/7- BGs a little elevated- had been doing great- will wait to change  11. Diabetic peripheral neuropathy: On elavil 100 mg, cymbalta, gabapentin (900/900/1200) 12. ETOH abuse/B  12 deficiency: On thiamine and folic acid. Add vitamin B 12.  13. Hyponatremia/Pre-renal azotemia: Improving. Continue to follow with serial checks.   9/1- Cr <1 now- doing well 14. Constipation  9/2- will give a dose of Sorbitol and hope pt able to go.   9/3- pt had 2 BMs- feeling good  9/4- con't on regular meds    LOS: 7 days A FACE TO FACE EVALUATION WAS PERFORMED  Jacquelynn Friend 07/07/2020, 9:38 AM

## 2020-07-07 NOTE — Patient Care Conference (Signed)
Inpatient RehabilitationTeam Conference and Plan of Care Update Date: 07/07/2020   Time: 11:00 AM    Patient Name: Eugene Diaz      Medical Record Number: [id number]  Date of Birth: 04-Mar-1966 Sex: Male         Room/Bed: 4W24C/4W24C-01 Payor Info: Payor: MEDICAID [place] / Plan: MEDICAID [place] ACCESS / Product Type: *No Product type* /    Admit Date/Time:  06/30/2020  5:58 PM  Primary Diagnosis:  Unilateral complete BKA, left, initial encounter [facility])  Hospital Problems: Principal Problem:   Unilateral complete BKA, left, initial encounter [facility])    Expected Discharge Date: Expected Discharge Date: 07/10/20  Team Members Present: Physician leading conference: Dr. Courtney Heys Care Coodinator Present: Dorthula Nettles, RN, BSN, CRRN;Loralee Pacas, [place] Nurse Present: Other (comment) Abigail Miyamoto, RN) PT Present: Excell Seltzer, PT OT Present: Roanna Epley, COTA;Jennifer Tamala Julian, OT PPS Coordinator present : Ileana Ladd, Burna Mortimer, SLP     Current Status/Progress Goal Weekly Team Focus  Bowel/Bladder   Continent x2; Last BM 9/6  Remain continent x2  Assess every shift and as needed   Swallow/Nutrition/ Hydration             ADL's   bating at sink with CGA; LB dressing withCGA; functional tranfsers-supervision; toileting-supervision with grab bars  mod I overall, supervision vision for bathing  safety awareness, BADL training, discharge planning   Mobility   Supervision for transfers with/without RW, mod I w/c mobility  mod I overall, short distance gait with RW, Supervision stairs (will likely d/c stair as pt reports he can have ramp built)  amputee edu, safety awareness, LE strengthening, d/c planning   Communication             Safety/Cognition/ Behavioral Observations            Pain   Pain 7/10 during shift; Pt given meds per MAR, repositioning  Pain <5/10; find nonpharm. interventions to cope with pain (example: deep breathing)  Assess every shift and as needed    Skin   Incision; scab to R heel  Prevent further breakdown to R heel  Assess every shift and as needed     Discharge Planning:  Pt to d/c to home with support from his sister Mickel Baas who will move into the home to help assist.   Team Discussion: Morphine has pain controlled. Continent of B/B. BKA now has Duda compression stocking on and patient tolerating well.  OT has Mod I goals. Pt has patient at supervision overall with 1 step, friend is building ramp. BP's are doing better. Sister is going home with the patient to assist at discharge. Patient on target to meet rehab goals: yes  *See Care Plan and progress notes for long and short-term goals.   Revisions to Treatment Plan:  None at this time.  Teaching Needs: Continue with family education  Current Barriers to Discharge: Decreased caregiver support, Wound care, Weight bearing restrictions and Medication compliance  Possible Resolutions to Barriers: Educate patient and family on wound care, continue current medication regimen for pain control.      Medical Summary Current Status: Wound looking better with VAC off- takes Oxy prn occ- on long acting Morphine- which works well- BGs all over the place- educated  Barriers to Discharge: Decreased family/caregiver support;Home enviroment access/layout;Weight;Weight bearing restrictions;Wound care;Other (comments)  Barriers to Discharge Comments: Wife death anniversary- worried he might go back to drinking when goes home. d/c 9/10 Possible Resolutions to [employer]: focus on wound care-  teaching pt how to do if possible-pain getting better- Supervision level- CGA-so far;  receptive to education-   Continued Need for Acute Rehabilitation Level of Care: The patient requires daily medical management by a physician with specialized training in physical medicine and rehabilitation for the following reasons: Direction of a multidisciplinary physical rehabilitation program to maximize  functional independence : Yes Medical management of patient stability for increased activity during participation in an intensive rehabilitation regime.: Yes Analysis of laboratory values and/or radiology reports with any subsequent need for medication adjustment and/or medical intervention. : Yes   I attest that I was present, lead the team conference, and concur with the assessment and plan of the team.   Cristi Loron 07/07/2020, 3:13 PM

## 2020-07-07 NOTE — Progress Notes (Signed)
Occupational Therapy Session Note  Patient Details  Name: Eugene Diaz MRN: [id number] Date of Birth: [birth year]  Today's Date: 07/07/2020 OT Individual Time: 0700-0800 OT Individual Time Calculation (min): 60 min    Short Term Goals: Week 1:  OT Short Term Goal 1 (Week 1): STG=LTG d/t ELOS  Skilled Therapeutic Interventions/Progress Updates:    Pt resting in bed upon arrival.  Pt requested to completed grooming and wash face at sink before leaving room. Squat pivot transfer to w/c with supervision.  Pt practiced simulated bed transfers. Pt states his box springs and mattress sit on floor at home. Pt able to perform scoot/squat transfer w/c<>simulated bed height with supervision. Pt performed sit<>stand X 5 from simulated height.  Pt also performed half squats with BUE support 3 X 10. Pt returned to room and transferred to bed with supervision. Pt remained in bed with all needs within reach and bed alarm activated.   Therapy Documentation Precautions:  Precautions Precautions: Fall Required Braces or Orthoses: Other Brace Other Brace: Limb protector Restrictions Weight Bearing Restrictions: Yes LLE Weight Bearing: Non weight bearing  Pain: Pain Assessment Pain Score: 6 LLE Meds admin prior to therapy; repositioned   Therapy/Group: Individual Therapy  [name] 07/07/2020, 9:04 AM

## 2020-07-08 ENCOUNTER — Inpatient Hospital Stay (HOSPITAL_COMMUNITY): Payer: Medicaid Other

## 2020-07-08 ENCOUNTER — Inpatient Hospital Stay (HOSPITAL_COMMUNITY): Payer: Medicaid Other | Admitting: Physical Therapy

## 2020-07-08 LAB — GLUCOSE, CAPILLARY
Glucose-Capillary: 154 mg/dL — ABNORMAL HIGH (ref 70–99)
Glucose-Capillary: 115 mg/dL — ABNORMAL HIGH (ref 70–99)
Glucose-Capillary: 88 mg/dL (ref 70–99)
Glucose-Capillary: 120 mg/dL — ABNORMAL HIGH (ref 70–99)

## 2020-07-08 NOTE — Progress Notes (Addendum)
Patient ID: PEGGY MONK, male   DOB: 02-Feb-1966, 54 y.o.   MRN: [id number]  SW waiting on follow-up from Tiffany/[facility] about referral. *[facility] declined referral  SW sent referral to Cory[facility] for HHPT/OT/SN referral and waiting on follow-up.  *referral declined  SW spoke with [facility] [phone]) to discuss referral. [facility] unable to accept referral.  SW spoke with Sarah/[facility] [phone]) and they do not service pt area.   SW sent referral to following: [facility]- declined [facility]-declined [facility]- declined; not in network [facility] sent DME order to [employer]. Reports they received order but do not have carry left amputee pads. SW cancelled order. DME order: w/c and TTB sent to [facility]. SW met with pt in room to inform on above about DME changes and challenges with HH. SW informed medical team about HHA, and requested HEP.   Loralee Pacas, MSW, [place] Office: [phone] Cell: [phone] Fax: [phone]

## 2020-07-08 NOTE — Progress Notes (Signed)
Occupational Therapy Session Note  Patient Details  Name: Eugene Diaz MRN: [id number] Date of Birth: February 20, 1966  Today's Date: 07/08/2020 OT Individual Time: 1300-1345 OT Individual Time Calculation (min): 45 min    Short Term Goals: Week 1:  OT Short Term Goal 1 (Week 1): STG=LTG d/t ELOS  Skilled Therapeutic Interventions/Progress Updates:    Pt received in w/c with 6/10 pain in his L residual limb, described as throbbing but pt reporting he is premedicated. Pt agreeable to OT session. Pt propelled w/c into the bathroom, self managing w/c propulsion over threshold with (S). Pt used urinal to void 400 cc urine with set up assist. Pt propelled w/c 250 ft with mod I, however wheezing with exertion, requiring several extended rest breaks. Pt transferred to the mat via lateral scoot with (S). He transitioned to prone on mat with (S). Edu provided re residual limb positioning for prosthesis prep. Pt completed 2x 10 prone push ups with focus on tricep activation to increase independence with ADL transfers. Pt also completed 1 set of 10 prone supermans to increase back extension and glute strength. Modified single leg bridges complete to increase glute strength needed for ADL transfers. Pt returned to his chair and propelled w/c back to his room. Pt was left sitting up with all needs met.   Therapy Documentation Precautions:  Precautions Precautions: Fall Required Braces or Orthoses: Other Brace Other Brace: Limb protector Restrictions Weight Bearing Restrictions: Yes LLE Weight Bearing: Non weight bearing  Therapy/Group: Individual Therapy  Curtis Sites 07/08/2020, 6:54 AM

## 2020-07-08 NOTE — Progress Notes (Signed)
Occupational Therapy Session Note  Patient Details  Name: Eugene Diaz MRN: [id number] Date of Birth: Mar 30, 1966  Today's Date: 07/08/2020 OT Individual Time: 0700-0800 OT Individual Time Calculation (min): 60 min    Short Term Goals: Week 1:  OT Short Term Goal 1 (Week 1): STG=LTG d/t ELOS  Skilled Therapeutic Interventions/Progress Updates:    OT intervention with focus on functional amb with RW, standing balance, sit<>stand, bathroom safety, bathing at shower level and dressing with sit<>stand from w/c at sink, and safety awareness to increase independence with BADLs. Pt amb with RW to bathroom to access shower-supervision. See Care Tool for assist levels. Standing at sink with supervision to pull pants over hips. Pt used grab bars in shower when standing to bathe buttocks. Pt requires more then a reasonable amount of time to complete tasks. No unsafe behaviors noted during session. Pt remained seated in bed with all needs within reach and bed alarm activated.   Therapy Documentation Precautions:  Precautions Precautions: Fall Required Braces or Orthoses: Other Brace Other Brace: Limb protector Restrictions Weight Bearing Restrictions: Yes LLE Weight Bearing: Non weight bearing Pain: Pt c/o 6/10 pain in LLE; RN aware and repositioned Therapy/Group: Individual Therapy  [name] 07/08/2020, 9:18 AM

## 2020-07-08 NOTE — Progress Notes (Signed)
Physical Therapy Session Note  Patient Details  Name: Eugene Diaz MRN: [id number] Date of Birth: 05-09-1966  Today's Date: 07/08/2020 PT Individual Time: 1515-1600 PT Individual Time Calculation (min): 45 min   Short Term Goals: Week 1:  PT Short Term Goal 1 (Week 1): =LTG due to ELOS  Skilled Therapeutic Interventions/Progress Updates:    Pt received seated in w/c in room, agreeable to PT session. Pt appears lethargic this PM and is falling asleep while trying to don his face mask. Per nursing pt received pain medication and muscle relaxer earlier this date that may be contributing to fatigue. Once pt gets moving he exhibits increased alertness. Manual w/c propulsion to/from therapy gym at mod I level. Squat pivot transfer w/c to/from mat table at mod I level. Sit to stand with Supervision to RW. Ambulation x 25 ft with RW at Supervision level. Pt demos good ability to utilize shoulder depression/elevation on RW vs "hopping" on RLE during gait. Trial gait with crutches as pt used these previously. Pt able to stand to crutches and take a few steps but becomes very unsafe and unbalance. Pt experiences near fall and requires max A to safely sit down in w/c. Pt determined to not be safe to use crutches at this time and he is in agreement. Pt reports his ramp may not be completed prior to d/c home Friday so discussed other methods for managing his few stairs to get inside. At this time recommending pt be boosted up in w/c up/down stairs, provided handout for safe method of navigating stairs in w/c. Pt left seated in w/c in room with needs in reach at end of session.  Therapy Documentation Precautions:  Precautions Precautions: Fall Required Braces or Orthoses: Other Brace Other Brace: Limb protector Restrictions Weight Bearing Restrictions: Yes LLE Weight Bearing: Non weight bearing   Therapy/Group: Individual Therapy   Excell Seltzer, PT, DPT  07/08/2020, 5:12 PM

## 2020-07-08 NOTE — Progress Notes (Signed)
[place] PHYSICAL MEDICINE & REHABILITATION PROGRESS NOTE   Subjective/Complaints:  Pt reports therapy is going well, and so is incision.   Per SW, likely will go home without H/H, so needs HEP.  TO d/c Friday- no issues  ROS:  Pt denies SOB, abd pain, CP, N/V/C/D, and vision changes   Objective:   No results found. Recent Labs    07/06/20 0642  WBC 4.4  HGB 8.0*  HCT 26.1*  PLT 199   Recent Labs    07/06/20 0642  NA 135  K 4.5  CL 101  CO2 26  GLUCOSE 137*  BUN 17  CREATININE 1.07  CALCIUM 9.5    Intake/Output Summary (Last 24 hours) at 07/08/2020 1915 Last data filed at 07/08/2020 1700 Gross per 24 hour  Intake 1320 ml  Output 3250 ml  Net -1930 ml     Physical Exam: Vital Signs Blood pressure (!) 141/68, pulse 86, temperature 98.5 F (36.9 C), resp. rate 14, height 6\' 2"  (1.88 m), weight 132 kg, SpO2 96 %.   General: sitting up in manual w/c in room, ap[propriate, NAD Mood and affect are appropriate Heart: RRR Lungs: CTA B/L- no W/R/R- good air movement Abdomen: Soft, NT, ND, (+)BS   Extremities: No clubbing, cyanosis, or edema  UEs 5/5 except L finger abd which is not testable due to 5th digit contracture LEs- RLE- HF 4-/5, KE/KF 5-/5; DF and PF 5/5 LLE- at least 4/5 Skin: IV L forearm and R forearm- OK Little abrasions R calf and R knee Removed wound VAC/via nursing ;limb guard and dressing was in place C/D/I- however pics are as below -Lateral aspect the staples coming off and bleeding a little and medial dog ear notable with some bleeding- skin/tissue friable- a little pink  Neurological: Ox3    Comments: Sensation to light touch intact except stocking AND glove pattern B/L  Psychiatric: still slow to respond- ?HOH- no change         Bottom two images 9/6 medial with scab sm amt sersang drainage on telfa   Assessment/Plan: 1. Functional deficits secondary to L BKA which require 3+ hours per day of interdisciplinary therapy in a  comprehensive inpatient rehab setting.  Physiatrist is providing close team supervision and 24 hour management of active medical problems listed below.  Physiatrist and rehab team continue to assess barriers to discharge/monitor patient progress toward functional and medical goals  Care Tool:  Bathing    Body parts bathed by patient: Right arm, Left arm, Chest, Abdomen, Front perineal area, Buttocks, Right upper leg, Left upper leg, Right lower leg   Body parts bathed by helper: Right lower leg Body parts n/a: Left lower leg   Bathing assist Assist Level: Supervision/Verbal cueing     Upper Body Dressing/Undressing Upper body dressing   What is the patient wearing?: Pull over shirt    Upper body assist Assist Level: Independent    Lower Body Dressing/Undressing Lower body dressing      What is the patient wearing?: Pants     Lower body assist Assist for lower body dressing: Supervision/Verbal cueing     Toileting Toileting    Toileting assist Assist for toileting: Supervision/Verbal cueing Assistive Device Comment: urinal   Transfers Chair/bed transfer  Transfers assist     Chair/bed transfer assist level: Independent with assistive device     Locomotion Ambulation   Ambulation assist   Ambulation activity did not occur: Safety/medical concerns  Assist level: Supervision/Verbal cueing Assistive device: Walker-rolling  Max distance: 25'   Walk 10 feet activity   Assist  Walk 10 feet activity did not occur: Safety/medical concerns  Assist level: Supervision/Verbal cueing Assistive device: Walker-rolling   Walk 50 feet activity   Assist Walk 50 feet with 2 turns activity did not occur: Safety/medical concerns         Walk 150 feet activity   Assist Walk 150 feet activity did not occur: Safety/medical concerns         Walk 10 feet on uneven surface  activity   Assist Walk 10 feet on uneven surfaces activity did not occur:  Safety/medical concerns   Assist level: Minimal Assistance - Patient > 75% Assistive device: Aeronautical engineer Will patient use wheelchair at discharge?: Yes Type of Wheelchair: Manual    Wheelchair assist level: Independent Max wheelchair distance: 150'    Wheelchair 50 feet with 2 turns activity    Assist        Assist Level: Independent   Wheelchair 150 feet activity     Assist      Assist Level: Independent   Blood pressure (!) 141/68, pulse 86, temperature 98.5 F (36.9 C), resp. rate 14, height 6\' 2"  (1.88 m), weight 132 kg, SpO2 96 %.  Medical Problem List and Plan: 1.  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  Antithrombotics: -DVT/anticoagulation:  Pharmaceutical: Lovenox             -antiplatelet therapy: N/a 3. Chronic pain/Pain Management: PDMP reviewed--PTA on Morphine ER 30 mg bid/Oxycodone 30 mg qid and Xanax 1 mg qid  9/4- pain tolerable- con't meds 4. Mood: LCSW to follow for evaluation and support.              -antipsychotic agents: N/A 5. Neuropsych: This patient is capable of making decisions on his own behalf. 6. Skin/Wound Care: Monitor wound for healing. Add protein supplement to promote healing.  9/4- remove VAC tomorrow AM  9/5- ordered tegaderm and dressing changes daily and prn  7. Fluids/Electrolytes/Nutrition: Monitor I/O. Check lytes in am.  8. HTN: Monitor BP tid--continue lasix, hydralazine TID and lisinopril.   9/4- will increase Hydralazine to 50 mg TID since BP still elevated- Lisinopril to maxed out.   9/5- slightly better- 379K sytolic- will con't for a few days  9/7- looking better- con't regimen  9/8- BP 140s/80s- con't regimen 9. Acute on chronic anemia: S/p 1 unit  PRBC 8/24--> hgb 7.8 and stable.8.0 today                      10. T2DM: Hgb A1c- 4.7 on Amaryl 4 mg po PTA. Will resume and titrate as indicated. Continue to monitor BS ac/hs and use SSI as needed. Will  d/c lantus tomorrow.   CBG (last 3)  Recent Labs    07/08/20 0539 07/08/20 1209 07/08/20 1652  GLUCAP 154* 115* 88   9/8- BGs great 88-150s today- con't regimen 11. Diabetic peripheral neuropathy: On elavil 100 mg, cymbalta, gabapentin (900/900/1200) 12. ETOH abuse/B 12 deficiency: On thiamine and folic acid. Add vitamin B 12.  13. Hyponatremia/Pre-renal  azotemia: Improving. Continue to follow with serial checks.   9/1- Cr <1 now- doing well 14. Constipation  9/2- will give a dose of Sorbitol and hope pt able to go.   9/3- pt had 2 BMs- feeling good  9/4- con't on regular meds    LOS: 8 days A FACE TO FACE EVALUATION WAS PERFORMED  Eugene Diaz 07/08/2020, 7:15 PM

## 2020-07-08 NOTE — Progress Notes (Signed)
Occupational Therapy Session Note  Patient Details  Name: ELENA COTHERN MRN: [id number] Date of Birth: 08/06/1966  Today's Date: 07/08/2020 OT Individual Time: 1100-1200 OT Individual Time Calculation (min): 60 min    Short Term Goals: Week 1:  OT Short Term Goal 1 (Week 1): STG=LTG d/t ELOS  Skilled Therapeutic Interventions/Progress Updates:    Pt resting in w/c upon arrival and "ready to get going." OT intervention with focus on w/c mobility, standing balance, kitchen safety, discharge planning, and safety awareness to increase independence with BADLs. Pt is supervision for w/c mobility in hallway, ADL apartment/kitchen, and room. Pt is supervision for w/c setup. Pt engaged in standing table activity to assemble/disassemble pipe tree. Pt also stood at table to to clean pvc pipes after donning gloves.  Pt required rest breaks X 3 during activity.  Pt RLE fatigued several times during activity but pt was able to self correct and straighten RLE before sitting back into w/c. Pt propelled w/c to ADL kitchen.  OTA educated pt on kitchen safety at w/c level.  Recommended w/c level in kitchen.  Pt currenlty receives meals on wheels and his microwave is on counter and easily accessed. Pt has tray to set on his lap when transporting meal. Pt returned to room and remained in w/c with NT present.   Therapy Documentation Precautions:  Precautions Precautions: Fall Required Braces or Orthoses: Other Brace Other Brace: Limb protector Restrictions Weight Bearing Restrictions: Yes LLE Weight Bearing: Non weight bearing  Pain: Pain Assessment Pain Scale: 0-10 Pain Score: 7  Pain Type: Acute pain;Surgical pain Pain Location: Leg Pain Orientation: Left Pain Descriptors / Indicators: Aching;Discomfort;Dull;Grimacing Pain Frequency: Constant Pain Onset: On-going Pain Intervention(s): Meds admin prior to therapy; repositioned  Therapy/Group: Individual Therapy  [name] 07/08/2020,  12:14 PM

## 2020-07-09 ENCOUNTER — Inpatient Hospital Stay (HOSPITAL_COMMUNITY): Payer: Medicaid Other | Admitting: Physical Therapy

## 2020-07-09 ENCOUNTER — Inpatient Hospital Stay (HOSPITAL_COMMUNITY): Payer: Medicaid Other

## 2020-07-09 LAB — GLUCOSE, CAPILLARY
Glucose-Capillary: 157 mg/dL — ABNORMAL HIGH (ref 70–99)
Glucose-Capillary: 96 mg/dL (ref 70–99)
Glucose-Capillary: 126 mg/dL — ABNORMAL HIGH (ref 70–99)
Glucose-Capillary: 81 mg/dL (ref 70–99)

## 2020-07-09 NOTE — Progress Notes (Signed)
Occupational Therapy Session Note  Patient Details  Name: ALAIN DESCHENE MRN: [id number] Date of Birth: 10-09-1966  Today's Date: 07/09/2020 OT Individual Time: 0700-0800 OT Individual Time Calculation (min): 60 min    Short Term Goals: Week 1:  OT Short Term Goal 1 (Week 1): STG=LTG d/t ELOS  Skilled Therapeutic Interventions/Progress Updates:    Pt resting in bed upon arrival and agreeable to therapy.  OT intervention with focus on bed mobility, functional transfers, sit<>stand, standing balance, activity tolerance, BADL training, and safety awareness to prepare for discharge home tomorrow. All tasks completed without physical assistance.  Pt attempted to don limb guard on RLE and required verbal cue to correct. Pt performed stand pivot transfer to [facility] in bathroom using grab bars at mod I level.  Pt also had difficulty placing L leg rest/amp pad on w/c. No unsafe behaviors noted. Mod verbal cues to lock w/c when stationary. Provided BUE HEP and pt return demonstrated proficiency in performing exercises. Pt remained in w/c with all needs within reach.   Therapy Documentation Precautions:  Precautions Precautions: Fall Required Braces or Orthoses: Other Brace Other Brace: Limb protector Restrictions Weight Bearing Restrictions: Yes LLE Weight Bearing: Non weight bearing Pain: Pain Assessment Pain Score: 7 , LLE Pain med admin prior to therapy   Therapy/Group: Individual Therapy  [name] 07/09/2020, 8:58 AM

## 2020-07-09 NOTE — Progress Notes (Signed)
Physical Therapy Discharge Summary  Patient Details  Name: Eugene Diaz MRN: [id number] Date of Birth: 03-26-1966  Today's Date: 07/09/2020 PT Individual Time: 1415-1530 PT Individual Time Calculation (min): 75 min    Patient has met 4 of 9 long term goals due to improved activity tolerance, improved balance, improved postural control, increased strength, increased range of motion, decreased pain.  Patient to discharge at a wheelchair level Supervision.   Patient's care partner is independent to provide the necessary physical assistance at discharge.  Reasons goals not met: pt has been very motivated and agreeable to therapy and shown progress toward all goals since admission however pt grossly supervision stand pivot transfers, car transfers, CGA with gait and assistance with stair use. PT has recommended ramp to enter home and has been educated on how to have family assist in [facility] up/down step into home, pt able to recall this at end of session and agreeable to all recommendations.   Recommendation:  Patient will benefit from ongoing skilled PT services in home health setting to continue to advance safe functional mobility, address ongoing impairments in ambulation, balance, transfers, safety, step use, and minimize fall risk.  Equipment: manual WC with break extenders, swing back arm rests, amputation leg rest on LLE and standard leg rest on RLE, anti-tippers  Reasons for discharge: discharge from hospital  Patient/family agrees with progress made and goals achieved: Yes   pt received in bed and agreeable to therapy. Pt directed in supine>sit EOB I without hospital bed functions; STS from EOB to RW at mod I, transfer to [facility] supervision with RW. Pt directed in [place] mobility to gym for 150' mod I. Pt directed in [facility] part mobility with pt able to take on/off B leg rests, remove and replace arm rests, lock and unlock breaks without cues. Pt directed in transfer to mat table from [facility] supervision, x10  STS from EOM to RW mod I. Sit>supine on mat and transferred into prone mod I  for hip extension strengthening 2x10 BLE; transferred to supine at mod I for BLE strengthening ex of 2x20 straight leg raise, hip abduction/adduction; transferred to sit EOM mod I. Pt directed in gait training with RW for 40' x2 at [facility] for safety with two turns. Pt directed in returning to [facility] from standing and directed in returning to room mod I in WC 150'. Pt's new personal WC in room and pt requested to transfer to new WC. Pt directed in transfer with RW to standing mod I and transferred to new WC at supervision. Pt also educated on new WC parts and setup with pt verbalizing understanding and demonstrating leg rests, breaks, and arm rest use properly. Pt left in WC, alarm belt set, All needs in reach and in good condition. Call light in hand.     PT Discharge Precautions/Restrictions Precautions Precautions: Fall Required Braces or Orthoses: Other Brace Other Brace: Limb protector Restrictions Weight Bearing Restrictions: Yes LLE Weight Bearing: Non weight bearing (LLE) Vital Signs Therapy Vitals Temp: 97.7 F (36.5 C) Pulse Rate: 79 Resp: 18 BP: 136/70 Patient Position (if appropriate): Sitting Oxygen Therapy SpO2: 100 % O2 Device: Room Air Pain Pain Assessment Pain Scale: 0-10 Pain Score: 1  Pain Location: Leg Pain Orientation: Left Pain Intervention(s): Rest;Repositioned Vision/Perception  Perception Perception: Within Functional Limits Praxis Praxis: Intact  Cognition Overall Cognitive Status: Within Functional Limits for tasks assessed Arousal/Alertness: Awake/alert Orientation Level: Oriented X4 Attention: Sustained;Focused Focused Attention: Appears intact Sustained Attention: Appears intact Memory: Appears intact Awareness:  Appears intact Problem Solving: Appears intact Safety/Judgment: Appears intact Comments: slow process and planning and intermittently  sequencing Sensation Sensation Light Touch: Impaired Detail Light Touch Impaired Details: Impaired RLE;Impaired LLE Hot/Cold: Appears Intact Proprioception: Impaired Detail Proprioception Impaired Details: Impaired RLE;Impaired LLE Coordination Gross Motor Movements are Fluid and Coordinated: Yes Fine Motor Movements are Fluid and Coordinated: Yes Motor  Motor Motor: Within Functional Limits Motor - Skilled Clinical Observations: WFL  Mobility Bed Mobility Bed Mobility: Rolling Right;Rolling Left;Supine to Sit;Sit to Supine Rolling Right: Independent Rolling Left: Independent Supine to Sit: Independent Sit to Supine: Independent Transfers Transfers: Sit to Stand;Stand to [employer];Lateral/Scoot Transfers Sit to Stand: Independent with assistive device Stand to Sit: Independent with assistive device Stand Pivot Transfers: Supervision/Verbal cueing Stand Pivot Transfer Details: Verbal cues for safe use of DME/AE;Manual facilitation for weight shifting;Verbal cues for precautions/safety Lateral/Scoot Transfers: Set up assist Transfer (Assistive device): Rolling walker Locomotion  Gait Ambulation: Yes Gait Assistance: Contact Guard/Touching assist Gait Distance (Feet): 40 Feet Assistive device: Rolling walker Gait Gait: Yes Gait Pattern: Impaired Gait Pattern: Decreased weight shift to left (hopping technique used on RLE and VC for tech and BUE support at walker) Gait velocity: decreased Stairs / Additional Locomotion Stairs: No Wheelchair Mobility Wheelchair Mobility: Yes Wheelchair Assistance: Chartered loss adjuster: Both upper extremities Wheelchair Parts Management: Supervision/cueing Distance: 150  Trunk/Postural Assessment  Cervical Assessment Cervical Assessment: Within Functional Limits Thoracic Assessment Thoracic Assessment: Within Functional Limits Lumbar Assessment Lumbar Assessment: Within Functional  Limits Postural Control Postural Control: Within Functional Limits  Balance Balance Balance Assessed: Yes Dynamic Sitting Balance Dynamic Sitting - Balance Support: During functional activity Dynamic Sitting - Level of Assistance: 6: Modified independent (Device/Increase time) Reach (Patient is able to reach ___ inches to right, left, forward, back): WFL Sitting balance - Comments: sitting EOB with S Static Standing Balance Static Standing - Balance Support: Bilateral upper extremity supported Static Standing - Level of Assistance: 5: Stand by assistance Extremity Assessment      RLE Assessment RLE Assessment: Within Functional Limits General Strength Comments: 5/5 grossly LLE Assessment LLE Assessment: Exceptions to [facility] Passive Range of Motion (PROM) Comments: L BKA Active Range of Motion (AROM) Comments: WFL at hip not assessed in knee with brace in place General Strength Comments: 5/5 hip flexion, hip adducation and abduction    [name] 07/09/2020, 3:41 PM

## 2020-07-09 NOTE — Progress Notes (Signed)
[place] PHYSICAL MEDICINE & REHABILITATION PROGRESS NOTE   Subjective/Complaints:  Pt reports therapy going well- thinks L BKA looking "great"- LBM yesterday AM.    ROS:  Pt denies SOB, abd pain, CP, N/V/C/D, and vision changes  Objective:   No results found. No results for input(s): WBC, HGB, HCT, PLT in the last 72 hours. No results for input(s): NA, K, CL, CO2, GLUCOSE, BUN, CREATININE, CALCIUM in the last 72 hours.  Intake/Output Summary (Last 24 hours) at 07/09/2020 0925 Last data filed at 07/09/2020 0500 Gross per 24 hour  Intake 840 ml  Output 2550 ml  Net -1710 ml     Physical Exam: Vital Signs Blood pressure (!) 156/75, pulse 85, temperature 98.5 F (36.9 C), resp. rate 18, height 6\' 2"  (1.88 m), weight 132 kg, SpO2 98 %.   General: sitting up in manual w/c in room, appropriate, NAD Mood and affect are appropriate- smiling, joking Heart: RRR Lungs: CTA B/L- no W/R/R- good air movement Abdomen: Soft, NT, ND, (+)BS  Extremities: No clubbing, cyanosis, or edema  UEs 5/5 except L finger abd which is not testable due to 5th digit contracture LEs- RLE- HF 4-/5, KE/KF 5-/5; DF and PF 5/5 LLE- at least 4/5 Skin: IV L forearm and R forearm- OK Little abrasions R calf and R knee Removed wound VAC/via nursing ;limb guard and dressing was in place C/D/I- however pics are as below -Lateral aspect the staples coming off and bleeding a little and medial dog ear notable with some bleeding- skin/tissue friable- a little pink -limb guard in place- less swelling  Neurological: Ox3    Comments: Sensation to light touch intact except stocking AND glove pattern B/L  Psychiatric: still slow to respond- ?HOH- no change         Bottom two images 9/6 medial with scab sm amt sersang drainage on telfa   Assessment/Plan: 1. Functional deficits secondary to L BKA which require 3+ hours per day of interdisciplinary therapy in a comprehensive inpatient rehab  setting.  Physiatrist is providing close team supervision and 24 hour management of active medical problems listed below.  Physiatrist and rehab team continue to assess barriers to discharge/monitor patient progress toward functional and medical goals  Care Tool:  Bathing    Body parts bathed by patient: Right arm, Left arm, Chest, Abdomen, Front perineal area, Buttocks, Right upper leg, Left upper leg, Right lower leg   Body parts bathed by helper: Right lower leg Body parts n/a: Left lower leg   Bathing assist Assist Level: Independent with assistive device     Upper Body Dressing/Undressing Upper body dressing   What is the patient wearing?: Pull over shirt    Upper body assist Assist Level: Independent    Lower Body Dressing/Undressing Lower body dressing      What is the patient wearing?: Underwear/pull up, Pants     Lower body assist Assist for lower body dressing: Independent with assitive device     Toileting Toileting    Toileting assist Assist for toileting: Independent with assistive device Assistive Device Comment: urinal   Transfers Chair/bed transfer  Transfers assist     Chair/bed transfer assist level: Independent with assistive device     Locomotion Ambulation   Ambulation assist   Ambulation activity did not occur: Safety/medical concerns  Assist level: Supervision/Verbal cueing Assistive device: Walker-rolling Max distance: 25'   Walk 10 feet activity   Assist  Walk 10 feet activity did not occur: Safety/medical concerns  Assist  level: Supervision/Verbal cueing Assistive device: Walker-rolling   Walk 50 feet activity   Assist Walk 50 feet with 2 turns activity did not occur: Safety/medical concerns         Walk 150 feet activity   Assist Walk 150 feet activity did not occur: Safety/medical concerns         Walk 10 feet on uneven surface  activity   Assist Walk 10 feet on uneven surfaces activity did not  occur: Safety/medical concerns   Assist level: Minimal Assistance - Patient > 75% Assistive device: Aeronautical engineer Will patient use wheelchair at discharge?: Yes Type of Wheelchair: Manual    Wheelchair assist level: Independent Max wheelchair distance: 150'    Wheelchair 50 feet with 2 turns activity    Assist        Assist Level: Independent   Wheelchair 150 feet activity     Assist      Assist Level: Independent   Blood pressure (!) 156/75, pulse 85, temperature 98.5 F (36.9 C), resp. rate 18, height 6\' 2"  (1.88 m), weight 132 kg, SpO2 98 %.  Medical Problem List and Plan: 1.  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  Antithrombotics: -DVT/anticoagulation:  Pharmaceutical: Lovenox             -antiplatelet therapy: N/a 3. Chronic pain/Pain Management: PDMP reviewed--PTA on Morphine ER 30 mg bid/Oxycodone 30 mg qid and Xanax 1 mg qid  9/4- pain tolerable- con't meds  9/9- will need to get from pain doctor after d/c.  4. Mood: LCSW to follow for evaluation and support.              -antipsychotic agents: N/A 5. Neuropsych: This patient is capable of making decisions on his own behalf. 6. Skin/Wound Care: Monitor wound for healing. Add protein supplement to promote healing.  9/4- remove VAC tomorrow AM  9/5- ordered tegaderm and dressing changes daily and prn  7. Fluids/Electrolytes/Nutrition: Monitor I/O. Check lytes in am.  8. HTN: Monitor BP tid--continue lasix, hydralazine TID and lisinopril.   9/4- will increase Hydralazine to 50 mg TID since BP still elevated- Lisinopril to maxed out.   9/5- slightly better- 237S sytolic- will con't for a few days  9/7- looking better- con't regimen  9/8- BP  140s/80s- con't regimen  9/9- BP 283T systolic-pt thinks stressed due to hospital? Will con't regimen 9. Acute on chronic anemia: S/p 1 unit PRBC 8/24--> hgb 7.8 and stable.8.0 today                       10. T2DM: Hgb A1c- 4.7 on Amaryl 4 mg po PTA. Will resume and titrate as indicated. Continue to monitor BS ac/hs and use SSI as needed. Will  d/c lantus tomorrow.   CBG (last 3)  Recent Labs    07/08/20 1652 07/08/20 2047 07/09/20 0541  GLUCAP 88 120* 157*   9/9- BG's great- con't regimen 11. Diabetic peripheral neuropathy: On elavil 100 mg, cymbalta, gabapentin (900/900/1200)  9/9- con't regimen- working well 12. ETOH abuse/B 12 deficiency: On thiamine and folic acid. Add  vitamin B 12.  13. Hyponatremia/Pre-renal azotemia: Improving. Continue to follow with serial checks.   9/1- Cr <1 now- doing well 14. Constipation  9/2- will give a dose of Sorbitol and hope pt able to go.   9/3- pt had 2 BMs- feeling good  9/4- con't on regular meds'  9/9- LBM yesterday morning- doing well    LOS: 9 days A FACE TO FACE EVALUATION WAS PERFORMED  Shamir Tuzzolino 07/09/2020, 9:25 AM

## 2020-07-09 NOTE — Progress Notes (Signed)
Patient ID: Eugene Diaz, male   DOB: 06-07-1966, 54 y.o.   MRN: [id number]  SW returned phone call to pt sister [name] [phone]) to discuss his discharge. She asked questions with regard to community resources such as transportation (I.e. Medicaid transport). She also stated concerns about pt getting into the home as she was not aware the ramp has been built. SW to check with pt again about this. No other questions/concerns reported.   SW met with pt in room to discuss ramp. Pt states he has not spoken to his friend. SW discussed how will he get into the home. Pt is not concerned about how he will get into his house. Pt states he will find a way. SW asked pt if he can speak with a friend about bumping him up backwards in w/c or ambulance transport to home. Pt aware SW will follow-up tomorrow to discuss further. SW waiting on updates from therapy if pt can physically bump himself up into the home or hop up steps to get into the home.   Loralee Pacas, MSW, [place] Office: [phone] Cell: [phone] Fax: [phone]

## 2020-07-09 NOTE — Progress Notes (Signed)
Occupational Therapy Discharge Summary  Patient Details  Name: Eugene Diaz MRN: [id number] Date of Birth: 11-19-1965  Patient has met 9 of 9 long term goals due to improved activity tolerance, improved balance and ability to compensate for deficits.  Pt made steady progress with BADLs and functional transfers during this admission.  Pt is independent with donning residual limb sock and limb guard. Pt completes bathing tasks and tub transfer with supervision. Pt completes toilet transfers and toileting with mod I. Pt issued BUE HEP and demonstrates proficiency in performing exercises. Pt states his sister will be staying with him for approx two weekes. Patient to discharge at overall Supervision to Mod I level.  Patient's care partner is independent to provide the necessary physical assistance at discharge.     Recommendation:  No further OT services needed at this time.  Equipment: No equipment provided pt owns BSC and tub seat  Reasons for discharge: treatment goals met  Patient/family agrees with progress made and goals achieved: Yes  OT Discharge Vision Baseline Vision/History: No visual deficits Patient Visual Report: No change from baseline Vision Assessment?: No apparent visual deficits Perception  Perception: Within Functional Limits Praxis Praxis: Intact Cognition Overall Cognitive Status: Within Functional Limits for tasks assessed Arousal/Alertness: Awake/alert Orientation Level: Oriented X4 Attention: Sustained;Focused Focused Attention: Appears intact Sustained Attention: Appears intact Memory: Appears intact Awareness: Appears intact Problem Solving: Appears intact Safety/Judgment: Appears intact Sensation Sensation Light Touch: Impaired Detail Light Touch Impaired Details: Impaired RLE;Impaired LLE Hot/Cold: Appears Intact Proprioception: Impaired Detail Proprioception Impaired Details: Impaired RLE;Impaired LLE Coordination Gross Motor Movements are  Fluid and Coordinated: Yes Fine Motor Movements are Fluid and Coordinated: Yes Finger Nose Finger Test: [facility] Motor  Motor Motor: Within Functional Limits    Trunk/Postural Assessment  Cervical Assessment Cervical Assessment: Within Functional Limits Thoracic Assessment Thoracic Assessment: Within Functional Limits Lumbar Assessment Lumbar Assessment: Within Functional Limits  Balance Dynamic Sitting Balance Dynamic Sitting - Balance Support: During functional activity Dynamic Sitting - Level of Assistance: 6: Modified independent (Device/Increase time) Extremity/Trunk Assessment RUE Assessment RUE Assessment: Within Functional Limits LUE Assessment LUE Assessment: Within Functional Limits   [name] 07/09/2020, 6:15 AM

## 2020-07-09 NOTE — Progress Notes (Signed)
Physical Therapy Session Note  Patient Details  Name: Eugene Diaz MRN: [id number] Date of Birth: 1966/03/03  Today's Date: 07/09/2020 PT Individual Time: 4818-5631 PT Individual Time Calculation (min): 60 min   Short Term Goals: Week 1:  PT Short Term Goal 1 (Week 1): =LTG due to ELOS Week 2:    Week 3:     Skilled Therapeutic Interventions/Progress Updates:    PAIN denies pain this am Pt initially sleeping in wc parked in shower stall of room.  Appears somewhat disoriented.  Pt requested to use urinal, performs independently and is continent.  wc propulsion >111ft including multiple turns mod I.  STS and gait x 52ft w/RW and close supervision, good ability to press and sway w/contolled impact RLE, knee maintained in approx 20degrees flexion but no buckling.   Armchair to wc SPT w/RW w/supervision.  Pt propells wc mod I to rehab apartment.  WC to/from bed w/RW and cues for set up/parts management and supervision using RW.  Bed mobility:  Performs all independently on standard bed. Supine to sit Independendtly  SPT bed to wc w/RW and supervision.  Pt reports bed set up at home is mattress and boxspring on floor.  Practiced STS from apartment couch to simulate height of bed.  Pt requires mod assist w/STS to/from this height.  Pt also reports that he has bedframe but it has not been assembled.  Agreed to contact friend  "Rush [employer]" to assemble for him.    Pt then practiced car transfer.  SPT wc to car w/cues and supervision using RW.  Reiterated to patient need to address bedframe and pt stated, "I think I will wait until I get home and see if it is going to be a problem.  Discussed w/patient risk of falls due to mod assist required w/transition, lack of safety w/STS from low height, risk of injury to residual limb w/falls, and distinct recommendation of this therapist to raise bed onto bedframe.  Pt agreed, but now states his mother and sister can do this for him and he would call  them after session.  Also reports he has not followed up w/"Bill" regarding ramp construction.  Agreed to followup and clarify this as well.   Pt propells wc to room mod I >284ft.  Pt left oob in wc w/alarm belt set and needs in reach Therapy Documentation Precautions:  Precautions Precautions: Fall Required Braces or Orthoses: Other Brace Other Brace: Limb protector Restrictions Weight Bearing Restrictions: Yes LLE Weight Bearing: Non weight bearing   Therapy/Group: Individual Therapy  Callie Fielding, [place] 07/09/2020, 10:53 AM

## 2020-07-09 NOTE — Progress Notes (Signed)
Inpatient Rehabilitation Care Coordinator  Discharge Note  The overall goal for the admission was met for:   Discharge location: Yes. D/c to home with support from sister Mickel Baas.   Length of Stay: Yes. 10 days.   Discharge activity level: Yes. Mod I at w/c level.   Home/community participation: Yes. Limited.   Services provided included: MD, RD, PT, OT, SLP, RN, CM, TR, Pharmacy, Neuropsych and SW  Financial Services: Medicaid  Follow-up services arranged: Home Health: Unable to obtain due to insurance or staffing. Pt aware there will continue to be follow-up on establishing [facility] provider.  and DME: [employer] for w/c and TTB  Comments (or additional information): contact pt [phone]  Patient/Family verbalized understanding of follow-up arrangements: Yes  Individual responsible for coordination of the follow-up plan: Pt to have assistance with coordinating care needs.   Confirmed correct DME delivered: Rana Snare 07/09/2020    Rana Snare

## 2020-07-10 LAB — GLUCOSE, CAPILLARY: Glucose-Capillary: 93 mg/dL (ref 70–99)

## 2020-07-10 MED ORDER — LISINOPRIL 40 MG PO TABS
40.0000 mg | ORAL_TABLET | Freq: Every day | ORAL | 0 refills | Status: DC
Start: 2020-07-10 — End: 2022-03-03

## 2020-07-10 MED ORDER — CYANOCOBALAMIN 100 MCG PO TABS: 100.0000 ug | ORAL_TABLET | Freq: Every day | ORAL | 0 refills | Status: DC

## 2020-07-10 MED ORDER — CYCLOBENZAPRINE HCL 5 MG PO TABS: 5.0000 mg | ORAL_TABLET | Freq: Three times a day (TID) | ORAL | 0 refills | Status: DC | PRN

## 2020-07-10 MED ORDER — THIAMINE HCL 100 MG PO TABS: 100.0000 mg | ORAL_TABLET | Freq: Every day | ORAL | 0 refills | Status: DC

## 2020-07-10 MED ORDER — HYDRALAZINE HCL 50 MG PO TABS: 50.0000 mg | ORAL_TABLET | Freq: Three times a day (TID) | ORAL | 0 refills | Status: DC

## 2020-07-10 MED ORDER — SENNOSIDES-DOCUSATE SODIUM 8.6-50 MG PO TABS: 2.0000 | ORAL_TABLET | Freq: Every day | ORAL | 0 refills | Status: DC

## 2020-07-10 MED ORDER — ADULT MULTIVITAMIN W/MINERALS CH: 1.0000 | ORAL_TABLET | Freq: Every day | ORAL | Status: DC

## 2020-07-10 MED ORDER — GLIMEPIRIDE 4 MG PO TABS
2.0000 mg | ORAL_TABLET | Freq: Every day | ORAL | 0 refills | Status: DC
Start: 2020-07-10 — End: 2021-04-02

## 2020-07-10 MED ORDER — FOLIC ACID 1 MG PO TABS: 1.0000 mg | ORAL_TABLET | Freq: Every day | ORAL | 0 refills | Status: DC

## 2020-07-10 NOTE — Discharge Instructions (Signed)
Inpatient Rehab Discharge Instructions  [place] Discharge date and time: 07/10/20   Activities/Precautions/ Functional Status: Activity: no lifting, driving, or strenuous exercise for till cleared by MD Diet: diabetic diet Wound Care: Wash with soap and water. Pat dry and apply shrinker daily. Contact Dr. Sharol Given  you develop any problems with your incision/wound--redness, swelling, increase in pain, drainage or if you develop fever or chills.    Functional status:  ___ No restrictions     ___ Walk up steps independently ___ 24/7 supervision/assistance   ___ Walk up steps with assistance _X__ Intermittent supervision/assistance  ___ Bathe/dress independently ___ Walk with walker     _X__ Bathe/dress with supervision  ___ Walk Independently    ___ Shower independently _X__ Walk with supervision     _X__ Shower with assistance _X__ No alcohol     ___ Return to work/school ________  Special Instructions:    My questions have been answered and I understand these instructions. I will adhere to these goals and the provided educational materials after my discharge from the hospital.  Patient/Caregiver Signature _______________________________ Date __________  Clinician Signature _______________________________________ Date __________  Please bring this form and your medication list with you to all your follow-up doctor's appointments.

## 2020-07-10 NOTE — Plan of Care (Signed)
  Problem: Consults Goal: RH LIMB LOSS PATIENT EDUCATION Description: Description: See Patient Education module for eduction specifics. Outcome: Completed/Met   Problem: RH BOWEL ELIMINATION Goal: RH STG MANAGE BOWEL WITH ASSISTANCE Description: STG Manage Bowel with Min Assistance. Outcome: Completed/Met   Problem: RH SKIN INTEGRITY Goal: RH STG SKIN FREE OF INFECTION/BREAKDOWN Description: No new breakdown with min assist/cues.  Outcome: Completed/Met Goal: RH STG ABLE TO PERFORM INCISION/WOUND CARE W/ASSISTANCE Description: STG Able To Perform Incision/Wound Care With Mod Assistance. Outcome: Completed/Met   Problem: RH PAIN MANAGEMENT Goal: RH STG PAIN MANAGED AT OR BELOW PT'S PAIN GOAL Description: <4 out of 10.  Outcome: Completed/Met   Problem: RH KNOWLEDGE DEFICIT LIMB LOSS Goal: RH STG INCREASE KNOWLEDGE OF SELF CARE AFTER LIMB LOSS Description: Pt will be able to verbalize and demonstrate proper care of residual limb including incision care, compression, and safety with min assist  Outcome: Completed/Met

## 2020-07-10 NOTE — Discharge Summary (Signed)
Physician Discharge Summary  Patient ID: Eugene Diaz MRN: [id number] DOB/AGE: 12/17/1965 54 y.o.  Admit date: 06/30/2020 Discharge date: 07/10/2020  Discharge Diagnoses:  Principal Problem:   Unilateral complete BKA, left, initial encounter [facility]) Active Problems:   Diabetes mellitus without complication ([place])   Q76 deficiency   Acute blood loss anemia   Discharged Condition: stable   Significant Diagnostic Studies: N/A   Labs:  Basic Metabolic Panel:  BMP Latest Ref Rng & Units 07/06/2020 07/01/2020 06/28/2020  Glucose 70 - 99 mg/dL 137(H) 111(H) 181(H)  BUN 6 - 20 mg/dL 17 15 17   Creatinine 0.61 - 1.24 mg/dL 1.07 0.98 0.94  Sodium 135 - 145 mmol/L 135 134(L) 134(L)  Potassium 3.5 - 5.1 mmol/L 4.5 4.2 4.4  Chloride 98 - 111 mmol/L 101 97(L) 101  CO2 22 - 32 mmol/L 26 29 25   Calcium 8.9 - 10.3 mg/dL 9.5 9.5 9.4    CBC: CBC Latest Ref Rng & Units 07/06/2020 07/01/2020 06/28/2020  WBC 4.0 - 10.5 K/uL 4.4 5.9 7.5  Hemoglobin 13.0 - 17.0 g/dL 8.0(L) 8.4(L) 7.8(L)  Hematocrit 39 - 52 % 26.1(L) 27.7(L) 25.4(L)  Platelets 150 - 400 K/uL 199 288 313    CBG: Recent Labs  Lab 07/09/20 0541 07/09/20 1128 07/09/20 1639 07/09/20 2110 07/10/20 0607  GLUCAP 157* 96 126* 81 93    Brief HPI:   Eugene Diaz is a 54 y.o. male with history of T2DM, polysubstance abuse, tremors, chronic pain, peripheral neuropathy.  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  He was started on broad-spectrum antibiotics and was found to have acute osteomyelitis left ankle and hindfoot for septic arthritis as well as abscess.  He was taken to the OR for I&D on 08/23 however due to extent of infection required left BKA on 08/25 by Dr. Sharol Given.  Postop completed IV antibiotic course on 08/28 and acute blood loss anemia treated with 1 units PRBCs.  He was maintained on CIWA protocol without signs of alcohol  withdrawal.  Therapy was ongoing and CIR was recommended due to functional decline.    Hospital Course: Eugene Diaz was admitted to rehab 06/30/2020 for inpatient therapies to consist of PT, ST and OT at least three hours five days a week. Past admission physiatrist, therapy team and rehab RN have worked together to provide customized collaborative inpatient rehab. RLE edema has been managed with use of TEDs for compression and wound VAC was removed on 09/05. Blood pressures were monitored on TID basis and has been stable on home regimen. His diabetes has been monitored with ac/hs CBG checks and SSI was use prn for tighter BS control. Amaryl was resumed and lantus discontinued. His BS have been tightly controlled and he has had hypoglycemic episodes therefore Amaryl was decreased to 2 mg daily. Pain is well controlled on home regimen as he is currently using oxycodone 2-3 X day on average in addition to flexeril and tylenol on prn basis. L-BKA incision is C/D/I with staples in place and his healing without s/s of infection. He has had improvement in endurance and mobility during his stay. He is currently independent at wheelchair level and supervision is recommended with ambulation.    Rehab course: During patient's stay in rehab weekly team conferences were held to monitor patient's progress, set goals and discuss barriers to discharge. At admission, patient required min assist with mobility and with ADL  tasks. He  has had improvement in activity tolerance, balance, postural control as well as ability to compensate for deficits.  He requires supervision with ADL tasks and is modified independent for toileting. He is independent for transfers and requires supervision with ambulation. Family education completed and HEP give to patient as follow up therapy not covered by medicaid.   Disposition:  Home  Diet: Diabetic diet.   Special Instructions: 1. Monitor BS ac/hs.  2. Repeat CBC in 1-2 weeks to  monitor for recovery.    Discharge Instructions    Ambulatory referral to Physical Medicine Rehab   Complete by: As directed      Allergies as of 07/10/2020   No Known Allergies     Medication List    STOP taking these medications   insulin aspart 100 UNIT/ML injection Commonly known as: novoLOG   insulin glargine 100 UNIT/ML injection Commonly known as: LANTUS     TAKE these medications   acetaminophen 325 MG tablet Commonly known as: TYLENOL Take 2 tablets (650 mg total) by mouth every 6 (six) hours as needed for mild pain (or Fever >/= 101).   albuterol 108 (90 Base) MCG/ACT inhaler Commonly known as: VENTOLIN HFA Inhale 2 puffs into the lungs every 6 (six) hours as needed for wheezing or shortness of breath.   ALPRAZolam 1 MG tablet Commonly known as: XANAX Take 0.5 tablets (0.5 mg total) by mouth 4 (four) times daily as needed for anxiety.   amitriptyline 100 MG tablet Commonly known as: ELAVIL Take 100 mg by mouth at bedtime.   atorvastatin 40 MG tablet Commonly known as: LIPITOR Take 40 mg by mouth every evening.   cyanocobalamin 100 MCG tablet Take 1 tablet (100 mcg total) by mouth daily.   cyclobenzaprine 5 MG tablet Commonly known as: FLEXERIL Take 1 tablet (5 mg total) by mouth 3 (three) times daily as needed for muscle spasms.   docusate sodium 100 MG capsule Commonly known as: COLACE Take 100 mg by mouth daily as needed for mild constipation.   DULoxetine 60 MG capsule Commonly known as: CYMBALTA Take 60 mg by mouth daily.   ferrous sulfate 325 (65 FE) MG tablet Take 1 tablet (325 mg total) by mouth daily.   folic acid 1 MG tablet Commonly known as: FOLVITE Take 1 tablet (1 mg total) by mouth daily.   furosemide 20 MG tablet Commonly known as: LASIX Take 20 mg by mouth daily as needed.   gabapentin 300 MG capsule Commonly known as: NEURONTIN TAKE 3 CAPSULES BY MOUTH IN THE MORNING, 3 CAPSULES AT NOON, AND 4 CAPSULES AT BEDTIME FOR  NERVE PAIN. What changed: additional instructions   glimepiride 4 MG tablet Commonly known as: AMARYL Take 0.5 tablets (2 mg total) by mouth daily.   glycopyrrolate 1 MG tablet Commonly known as: ROBINUL Take 1 mg by mouth 2 (two) times daily.   hydrALAZINE 50 MG tablet Commonly known as: APRESOLINE Take 1 tablet (50 mg total) by mouth every 8 (eight) hours. What changed:   medication strength  how much to take   hydrOXYzine 25 MG tablet Commonly known as: ATARAX/VISTARIL Take 25 mg by mouth 4 (four) times daily as needed.   lisinopril 40 MG tablet Commonly known as: ZESTRIL Take 1 tablet (40 mg total) by mouth at bedtime.   morphine 30 MG 12 hr tablet Commonly known as: MS CONTIN Take 30 mg by mouth every 12 (twelve) hours.   multivitamin with minerals Tabs tablet Take 1  tablet by mouth daily.   omeprazole 20 MG capsule Commonly known as: PRILOSEC TAKE 1 CAPSULE WITH BREAKFAST AND SUPPER. What changed: See the new instructions.   oxycodone 30 MG immediate release tablet Commonly known as: ROXICODONE Take 30 mg by mouth 4 (four) times daily as needed.   primidone 50 MG tablet Commonly known as: MYSOLINE TAKE (1) TABLET BY MOUTH TWICE DAILY. What changed: See the new instructions.   senna-docusate 8.6-50 MG tablet Commonly known as: Senokot-S Take 2 tablets by mouth at bedtime.   thiamine 100 MG tablet Take 1 tablet (100 mg total) by mouth daily.   Voltaren 1 % Gel Generic drug: diclofenac Sodium Apply 1 application topically daily as needed (pain).       Follow-up Information    Lovorn, Jinny Blossom, MD Follow up.   Specialty: Physical Medicine and Rehabilitation Why: Office will call you with follow up appointment Contact information: [address] [place] [address] [phone]        Newt Minion, MD. Call on 07/13/2020.   Specialty: Orthopedic Surgery Why: for post op appointmene/staple removal Contact information: [place] [address] [phone]        [name], PA-C. Call in 1 day(s).   Specialties: Physician Assistant, Internal Medicine Why: for post hospital follow up. Contact information: [place] [address] [phone]               Signed: Bary Leriche 07/12/2020, 10:30 PM

## 2020-07-10 NOTE — Progress Notes (Signed)
Patient discharged off of unit with all belongings. Discharge papers/instructions explained by physician assistant to family and patient. Patient and family have no further questions at time of discharge. No complications noted at this time.  [place]

## 2020-07-15 ENCOUNTER — Telehealth: Payer: Self-pay

## 2020-07-15 NOTE — Telephone Encounter (Signed)
Called patient two different times on two different days and no answer, was able to leave message the second day. No return call

## 2020-07-20 ENCOUNTER — Telehealth: Payer: Self-pay

## 2020-07-20 NOTE — Telephone Encounter (Signed)
SW received updated from Tiffany/[facility] that pt will be accepted for HHPT/OT/SN and there will be a delay of care starting next week due to his location being [facility]  SW called pt this morning to inform on above. SW encouraged pt to check his voice mail as EMR indicates clinic staff not being able to get in contact. Pt states he will check his voicemail.

## 2020-07-20 NOTE — Telephone Encounter (Signed)
SW received updates from Tiffany/[facility] that [facility] cannot provide nursing at this time, and only HHPT/OT.

## 2020-07-22 ENCOUNTER — Encounter: Payer: Self-pay | Admitting: Registered Nurse

## 2020-07-22 ENCOUNTER — Encounter: Payer: Medicaid Other | Attending: Registered Nurse | Admitting: Registered Nurse

## 2020-07-22 ENCOUNTER — Telehealth: Payer: Self-pay | Admitting: Orthopedic Surgery

## 2020-07-22 ENCOUNTER — Telehealth: Payer: Self-pay | Admitting: Radiology

## 2020-07-22 ENCOUNTER — Other Ambulatory Visit: Payer: Self-pay

## 2020-07-22 VITALS — BP 160/83 | HR 75 | Temp 98.7°F | Ht 74.0 in | Wt 288.0 lb

## 2020-07-22 DIAGNOSIS — S88112A Complete traumatic amputation at level between knee and ankle, left lower leg, initial encounter: Secondary | ICD-10-CM | POA: Diagnosis present

## 2020-07-22 DIAGNOSIS — I10 Essential (primary) hypertension: Secondary | ICD-10-CM

## 2020-07-22 NOTE — Telephone Encounter (Signed)
Pt has follow up next week.

## 2020-07-22 NOTE — Telephone Encounter (Signed)
I called and lm on vm for Eugene Diaz to advise she can call back with questions. We would also reach out to the pt to make an appt for post surgical follow up.

## 2020-07-22 NOTE — Progress Notes (Signed)
Subjective:    Patient ID: Eugene Diaz, male    DOB: 1966/09/25, 54 y.o.   MRN: [id number]  HPI: Eugene Diaz is a 54 y.o. male who is here for Hospital Follow Up of his Unilateral complete BKA, left, Diabetes Mellitus and Essential Hypertension. Eugene Diaz presented to the ED with complaints of increasing left leg redness, swelling, pain and yellowish drainage ongoing for several days to weeks. He was found to have acute osteomyelitis left ankle and hindfoot for septic arthritis as well as abscess. Orthopedic was Consulted.  DG Tibia/Fibula Left:  IMPRESSION: 1. Soft tissue edema throughout the lower leg without soft tissue gas. 2. Osteopenia in portions of the tibia, fibula, and posterior calcaneus. Apparent erosive change along the posterior tibia on the lateral view. Given the left foot partial amputation, these findings could be due to aggressive osteoporosis from lack of mobility. Osteomyelitis not excluded on this study. If there is clinical concern for osteomyelitis in the region of the ankle and hindfoot, MRI would be more sensitive and specific.  MR Tibia Fibula Left W WO Contrast:  IMPRESSION: 1. Extensive acute osteomyelitis centered at the left ankle and hindfoot, as described above. 2. Soft tissue ulceration at the anteromedial aspect of the ankle with a 6.3 cm deep soft tissue abscess and associated septic arthritis of the tibiotalar and subtalar joints. 3. Diffuse edema-like signal throughout the lower extremity musculature which may reflect myositis and/or denervation changes.  CT Head WO Contrast:  IMPRESSION: No acute CT finding.  Mild age related volume loss.   He underwent on 06/22/2020 with Dr [name] IRRIGATION AND DEBRIDEMENT EXTREMITY, left foot and ankle Left  On 06/24/2020 he underwent LEFT BELOW KNEE AMPUTATION by Dr Sharol Given.   Eugene Diaz was admitted to inpatient rehabilitation on 08/31/2021and discharged home on 07/10/2020. Home Health was  unable to be set up per Erlene Quan SW discharge note. He states his pain is in his left stump. He rates his pain 10. Also reports he has a good appetite.   This provider step out of the exam room to gather supplies, Eugene Diaz had removed his dressing from his left stump. When provider walked in the room, Eugene Diaz was leaning forward throwing old dressing away, bleeding was noted at the incision line, site was cleansed, bleeding stopped, staples intact. Dr. Ranell Patrick also looked at the incision and no bleeding noted. This provider placed a call to Dr Sharol Given office regarding the above, he has an appointment with Dr Sharol Given on 07/30/2020. Eugene Diaz is awre of the above and verbalizes understanding.   Eugene Diaz Morphine equivalent is 240.00MME. His Morphine and  Oxycodone is being prescribed by Collene Mares. He  is also prescribed Alprazolam by Collene Mares. We have discussed the black box warning of using opioids and benzodiazepines. I highlighted the dangers of using these drugs together and discussed the adverse events including respiratory suppression, overdose, cognitive impairment and importance of compliance with current regimen.   Pain Inventory Average Pain 10 Pain Right Now 10 My pain is sharp, tingling and aching  In the last 24 hours, has pain interfered with the following? General activity 10 Relation with others 8 Enjoyment of life 8 What TIME of day is your pain at its worst? morning , daytime, evening and night Sleep (in general) Good  Pain is worse with: walking, bending, inactivity, standing and some activites Pain improves with: therapy/exercise and medication Relief from Meds: 10  walk with assistance use a walker needs  help with transfers  I need assistance with the following:  meal prep, household duties and shopping  tingling trouble walking dizziness depression anxiety  TC  TC    Family History  Problem Relation Age of Onset  . Heart attack  Mother        Living, 16  . Cancer Father        Deceased  . Healthy Brother   . Healthy Sister   . Colon cancer Neg Hx   . Colon polyps Neg Hx    Social History   Socioeconomic History  . Marital status: Widowed    Spouse name: Not on file  . Number of children: 0  . Years of education: Not on file  . Highest education level: 8th grade  Occupational History  . 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  Tobacco Use  . Smoking status: Former Smoker    Packs/day: 2.50    Years: 25.00    Pack years: 62.50    Types: Cigarettes    Quit date: 08/05/1984    Years since quitting: 35.9  . Smokeless tobacco: Never Used  Vaping Use  . Vaping Use: Never used  Substance and Sexual Activity  . Alcohol use: Yes    Alcohol/week: 50.0 standard drinks    Types: 50 Standard drinks or equivalent per week    Comment: Drinks 6 beers, liquor 4-6oz (both daily) x 35 years  . Drug use: Not Currently    Types: Marijuana    Comment: 03-18-2020  per pt none since 11/ 2020  . Sexual activity: Yes    Birth control/protection: None  Other Topics Concern  . 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 Highest level of education:  8th grade   He lives with wife.  No children.   Left handed   Drinks soda, water-no coffee, no tea   Social Determinants of Health   Financial Resource Strain:   . Difficulty of Paying Living Expenses: Not on file  Food Insecurity:   . Worried About Charity fundraiser in the Last Year: Not on file  . Ran Out of Food in the Last Year: Not on file  Transportation Needs:   . Lack of Transportation (Medical): Not on file  . Lack of Transportation (Non-Medical): Not on file  Physical Activity:   . Days of Exercise per Week: Not on file  . Minutes of Exercise per Session: Not on file  Stress:   . Feeling of Stress : Not on file  Social Connections:   .  Frequency of Communication with Friends and Family: Not on file  . Frequency of Social Gatherings with Friends and Family: Not on file  . Attends Religious Services: Not on file  . Active Member of Clubs or Organizations: Not on file  . Attends Archivist Meetings: Not on file  . Marital Status: Not on file   Past Surgical History:  Procedure Laterality Date  . AMPUTATION Left 10/16/2019   Procedure: Left partial second ray resection; placement of antibiotic beads;  Surgeon: Evelina Bucy, DPM;  Location: [facility];  Service: Podiatry;  Laterality: Left;  . AMPUTATION Left 11/13/2019   Procedure: Left Midfoot Amputation - Transmetatarsal vs. Lisfranc; Placement antibiotic beads;  Surgeon: Evelina Bucy, DPM;  Location: [facility];  Service: Podiatry;  Laterality: Left;  . AMPUTATION Left 12/21/2019   Procedure: Chopart Amputation  left foot;  Surgeon: Evelina Bucy, DPM;  Location: [place];  Service: Podiatry;  Laterality: Left;  . AMPUTATION Left 06/24/2020   Procedure: LEFT BELOW KNEE AMPUTATION;  Surgeon: Newt Minion, MD;  Location: [place];  Service: Orthopedics;  Laterality: Left;  . AMPUTATION TOE Left 08/14/2019   Procedure: AMPUTATION GREAT TOE;  Surgeon: Evelina Bucy, DPM;  Location: [facility];  Service: Podiatry;  Laterality: Left;  . APPLICATION OF WOUND VAC Left 12/21/2019   Procedure: Application Of Wound Vac;  Surgeon: Evelina Bucy, DPM;  Location: [place];  Service: Podiatry;  Laterality: Left;  . BIOPSY  02/13/2018   Procedure: BIOPSY;  Surgeon: Danie Binder, MD;  [facility];  Service: Endoscopy;;  transverse colon biopsy, gastric biopsy  . COLONOSCOPY WITH PROPOFOL N/A 02/13/2018   Procedure: COLONOSCOPY WITH PROPOFOL;  Surgeon: Danie Binder, MD;  [facility];  Service: Endoscopy;  Laterality: N/A;  11:15am  . ESOPHAGOGASTRODUODENOSCOPY (EGD) WITH PROPOFOL N/A 02/13/2018   Procedure: ESOPHAGOGASTRODUODENOSCOPY (EGD) WITH PROPOFOL;   Surgeon: Danie Binder, MD;  [facility];  Service: Endoscopy;  Laterality: N/A;  . I & D EXTREMITY Left 07/16/2019   Procedure: Insicion  AND DEBRIDEMENT EXTREMITY;  Surgeon: Evelina Bucy, DPM;  Location: [place];  Service: Podiatry;  Laterality: Left;  . I & D EXTREMITY Left 06/22/2020   Procedure: IRRIGATION AND DEBRIDEMENT EXTREMITY, left foot and ankle;  Surgeon: Evelina Bucy, DPM;  Location: [place];  Service: Podiatry;  Laterality: Left;  . INCISION AND DRAINAGE OF WOUND Left 10/13/2019   Procedure: IRRIGATION AND DEBRIDEMENT WOUND OF LEFT FOOT AND FIRST METATARSAL RESECTION;  Surgeon: Evelina Bucy, DPM;  Location: [facility];  Service: Podiatry;  Laterality: Left;  . IRRIGATION AND DEBRIDEMENT FOOT Left 11/11/2019   Procedure: Left Foot Wound Irrigation and Debridement;  Surgeon: Evelina Bucy, DPM;  Location: [facility];  Service: Podiatry;  Laterality: Left;  . Left arm     Left arm repair (tendon and artery)  . PERCUTANEOUS PINNING  07/16/2019   Procedure: Open Reduction Percutaneous Pinning Extremity;  Surgeon: Evelina Bucy, DPM;  Location: [place];  Service: Podiatry;;  . PILONIDAL CYST EXCISION N/A 08/08/2014   Procedure: CYST EXCISION PILONIDAL EXTENSIVE;  Surgeon: Jamesetta So, MD;  Location: [facility];  Service: General;  Laterality: N/A;  . POLYPECTOMY  02/13/2018   Procedure: POLYPECTOMY;  Surgeon: Danie Binder, MD;  [facility];  Service: Endoscopy;;  transverse colon polyp hs, rectal polyps times 2  . SAVORY DILATION N/A 02/13/2018   Procedure: SAVORY DILATION;  Surgeon: Danie Binder, MD;  [facility];  Service: Endoscopy;  Laterality: N/A;  . WOUND DEBRIDEMENT Left 09/04/2019   Procedure: DEBRIDEMENT WOUND WITH COMPLEX  REPAIR OF DEHISCENCE;  Surgeon: Evelina Bucy, DPM;  Location: [place];  Service: Podiatry;  Laterality: Left;  Leave patient on stretcher  . WOUND DEBRIDEMENT Left 10/16/2019   Procedure: Left  foot wound debridement and closure;  Surgeon: Evelina Bucy, DPM;  Location[facility];  Service: Podiatry;  Laterality: Left;  . WOUND DEBRIDEMENT Left 12/18/2019   Procedure: LEFT FOOT DEBRIDEMENT WITH PARTIAL INCISION OF INFECTED BONE;  Surgeon: Evelina Bucy, DPM;  Location: [place];  Service: Podiatry;  Laterality: Left;  LEFT FOOT DEBRIDEMENT WITH PARTIAL INCISION OF INFECTED BONE   Past Medical History:  Diagnosis Date  . Alcohol abuse   .  Alcoholic peripheral neuropathy ([place])   . Anxiety   . Arthritis   . Asthma    followed by pcp  . B12 deficiency   . Depression   . Diabetic neuropathy ([place])   . Edema of both lower extremities   . Essential tremor    neurologist-- dr patel--- due to alcohol abuse  . GERD (gastroesophageal reflux disease)   . History of cellulitis 2020   left lower leg;   recurrent 03-25-2020  . History of esophageal stricture    s/p  dilatation 02-13-2018  . History of osteomyelitis 2020   left great toe    . Hypercholesterolemia   . Hypertension    followed by pcp  (nuclear stress test 03-11-2014 low risk w/ no ischemia, ef 65%  . Normocytic anemia    followed by pcp   (03-18-2020 had transfusion's 02/ 2021)  . Peripheral vascular disease ([place])   . 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 Thrombocytopenia ([facility])    chronic  . Type 2 diabetes mellitus ([place])    followed by pcp---    (03-18-2020  pt stated does not check blood sugar)   BP (!) 178/80   Pulse 80   Ht 6\' 2"  (1.88 m)   Wt 288 lb (130.6 kg)   SpO2 98%   BMI 36.98 kg/m   Opioid Risk Score:   Fall Risk Score:  `1  Depression screen PHQ 2/9  No flowsheet data found.  Review of Systems  Constitutional: Positive for  diaphoresis.  HENT: Negative.   Respiratory: Positive for wheezing.   Cardiovascular: Negative.   Gastrointestinal: Negative.   Endocrine:       High/low blood sugar   Genitourinary: Negative.   Musculoskeletal: Positive for gait problem.  Neurological: Positive for dizziness.       Tingling   Hematological: Negative.   Psychiatric/Behavioral: Positive for dysphoric mood. The patient is nervous/anxious.        Objective:   Physical Exam Constitutional:      Appearance: Normal appearance.  Cardiovascular:     Rate and Rhythm: Normal rate and regular rhythm.     Pulses: Normal pulses.     Heart sounds: Normal heart sounds.  Pulmonary:     Effort: Pulmonary effort is normal.     Breath sounds: Normal breath sounds.  Musculoskeletal:     Cervical back: Normal range of motion and neck supple.     Comments: Normal Muscle Bulk and Muscle Testing Reveals:  Upper Extremities: Full ROM and Muscle Strength on the Right 5/5 and Left 4/5 Lower Extremities: Right: Full ROM and Muscle Strength 5/5 Left Lower Extremity BKA: Wearing Limb Protector Arrived in wheelchair   Skin:    General: Skin is warm and dry.  Neurological:     Mental Status: He is oriented to person, place, and time.  Psychiatric:        Mood and Affect: Mood normal.        Behavior: Behavior normal.           Assessment & Plan:  1.Unilateral complete BKA: Left. He has a scheduled appointment with Dr Sharol Given on 07/30/2020. Continue to Monitor.  2. Diabetes Mellitus: Continue  current medication regimen. F/U with PCP. Continue to Monitor.  3. Essential Hypertension: Continue current medication regimen. F/U with PCP. Continue to Monitor.   20 minutes of face to face patient care time was spent during this visit. All questions were encouraged and answered.  F/U with Dr. Dagoberto Ligas

## 2020-07-22 NOTE — Patient Instructions (Signed)
Call Dr Sharol Given ( Surgeon ) Office to Schedule appointment.  [phone] [address]  [place], [place] [address]

## 2020-07-22 NOTE — Telephone Encounter (Signed)
NP Marcello Moores called to notify Dr. Sharol Given. That patient was released from rehab center to care of left his stump. NP Marcello Moores stated when she left room pt discarded bandages and ointment. She reapplied dressing and bandages. Patient stitches where in tack and bandages on. Phone number to NP Marcello Moores if any questions arise is [phone].

## 2020-07-22 NOTE — Telephone Encounter (Signed)
Zella Ball is calling because the patient has not followed up with our office since his surgery, and he is in there office and she has some questions for you if. Please call her back to discuss her questions.  [phone]

## 2020-07-22 NOTE — Telephone Encounter (Signed)
I called and made and appt for 07/30/20 at 1:45

## 2020-07-24 ENCOUNTER — Telehealth: Payer: Self-pay

## 2020-07-24 LAB — FUNGAL ORGANISM REFLEX

## 2020-07-24 LAB — FUNGUS CULTURE WITH STAIN

## 2020-07-24 LAB — FUNGUS CULTURE RESULT

## 2020-07-24 NOTE — Telephone Encounter (Signed)
A Representative from [facility] called: [facility] home health will start on Sunday 07/26/2020.

## 2020-07-28 ENCOUNTER — Other Ambulatory Visit: Payer: Self-pay | Admitting: Neurology

## 2020-07-28 ENCOUNTER — Other Ambulatory Visit: Payer: Self-pay | Admitting: Gastroenterology

## 2020-07-28 ENCOUNTER — Telehealth: Payer: Self-pay | Admitting: *Deleted

## 2020-07-28 NOTE — Telephone Encounter (Signed)
I agree [facility] should be added for these reasons!

## 2020-07-28 NOTE — Telephone Encounter (Signed)
Pilar Plate, HHPT, [facility] left a message reporting that they went out and saw the patient over the weekend.  She reports that patient is extremely depressed and is non-compliant with his depression medication along with B/P medication and blood sugar medication.  She reports his equipment and test strips are outdated.  HHPT believes it would be a great idea to add H[facility] to come out and see the patient for medication management and blood sugar management.  Dr. Dagoberto Ligas patient.  Dr. Dagoberto Ligas is out on [facility]

## 2020-07-29 ENCOUNTER — Telehealth: Payer: Self-pay | Admitting: *Deleted

## 2020-07-29 NOTE — Telephone Encounter (Signed)
Contacted [facility] and gave verbal order

## 2020-07-30 ENCOUNTER — Encounter: Payer: Self-pay | Admitting: Orthopedic Surgery

## 2020-07-30 ENCOUNTER — Ambulatory Visit (INDEPENDENT_AMBULATORY_CARE_PROVIDER_SITE_OTHER): Payer: Medicaid Other | Admitting: Physician Assistant

## 2020-07-30 ENCOUNTER — Other Ambulatory Visit: Payer: Self-pay

## 2020-07-30 VITALS — Ht 74.0 in | Wt 288.0 lb

## 2020-07-30 DIAGNOSIS — M869 Osteomyelitis, unspecified: Secondary | ICD-10-CM

## 2020-07-30 NOTE — Progress Notes (Signed)
Office Visit Note   Patient: Eugene Diaz           Date of Birth: 10-Jun-1966           MRN: [id number] Visit Date: 07/30/2020              Requested by: [name], [place],  [place] [address] PCP: [name], PA-C  Chief Complaint  Patient presents with  . 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  He has been wearing his shrinker and has no complaints.  He does have a little area of hyper granulation tissue which he says scabs over but then the scab comes off when he removed the sock  Assessment & Plan: Visit Diagnoses: No diagnosis found.  Plan: Patient was seen today directly by Dr. Sharol Given.  He has plans to go begin to have his prosthetic molded.  We will follow up in 2 weeks.  Follow-Up Instructions: No follow-ups on file.   Ortho Exam  Patient is alert, oriented, no adenopathy, well-dressed, normal affect, normal respiratory effort. Well-healed surgical incision swelling is well controlled no cellulitis.  No drainage no foul odor he does have some hyper granulation tissue on the periphery of the wound.  This was touched with a silver nitrate stick.  Patient was seen today by Dr. Sharol Given  Imaging: No results found. No images are attached to the encounter.  Labs: Lab Results  Component Value Date   HGBA1C 6.0 (H) 06/21/2020   HGBA1C 4.7 (L) 01/17/2020   HGBA1C 6.4 (H) 11/10/2019   ESRSEDRATE 120 (H) 06/21/2020   ESRSEDRATE 40 (H) 01/18/2020   ESRSEDRATE 97 (H) 11/10/2019   CRP 20.9 (H) 06/21/2020   CRP 14.1 (H) 01/19/2020   CRP 7.6 (H) 11/10/2019   REPTSTATUS 06/27/2020 FINAL 06/22/2020   GRAMSTAIN NO WBC SEEN NO ORGANISMS SEEN  06/22/2020   CULT  06/22/2020    RARE STAPHYLOCOCCUS AUREUS CRITICAL RESULT CALLED TO, READ BACK BY AND VERIFIED WITH: RN G.ANIRA AT 1914 ON 06/23/20 BY T.SAAD NO ANAEROBES  ISOLATED Performed at [facility], [place] [address], [place], [place] [address]    LABORGA STAPHYLOCOCCUS AUREUS 06/22/2020     Lab Results  Component Value Date   ALBUMIN 2.7 (L) 07/01/2020   ALBUMIN 2.0 (L) 06/24/2020   ALBUMIN 1.9 (L) 06/23/2020   PREALBUMIN 14.0 (L) 10/11/2019    Lab Results  Component Value Date   MG 1.9 06/24/2020   MG 1.7 06/23/2020   MG 1.9 06/21/2020   No results found for: [facility]  Lab Results  Component Value Date   PREALBUMIN 14.0 (L) 10/11/2019   CBC EXTENDED Latest Ref Rng & Units 07/06/2020 07/01/2020 06/28/2020  WBC 4.0 - 10.5 K/uL 4.4 5.9 7.5  RBC 4.22 - 5.81 MIL/uL 3.04(L) 3.24(L) 2.99(L)  HGB 13.0 - 17.0 g/dL 8.0(L) 8.4(L) 7.8(L)  HCT 39 - 52 % 26.1(L) 27.7(L) 25.4(L)  PLT 150 - 400 K/uL 199 288 313  NEUTROABS 1.7 - 7.7 K/uL - 3.6 -  LYMPHSABS 0.7 - 4.0 K/uL - 1.3 -     Body mass index is 36.98 kg/m.  Orders:  No orders of the defined types were placed in this encounter.  No orders of the defined types were placed in this encounter.    Procedures: No procedures  performed  Clinical Data: No additional findings.  ROS:  All other systems negative, except as noted in the HPI. Review of Systems  Objective: Vital Signs: Ht 6\' 2"  (1.88 m)   Wt 288 lb (130.6 kg)   BMI 36.98 kg/m   Specialty Comments:  No specialty comments available.  PMFS History: Patient Active Problem List   Diagnosis Date Noted  . Unilateral complete BKA, left, initial encounter ([place]) 06/30/2020  . Severe protein-calorie malnutrition ([place])   . Acute blood loss anemia 01/04/2020  . Asthma   . Depression with anxiety   . Osteomyelitis of ankle or foot, acute, left ([place]) 12/17/2019  . Hyperglycemia 12/17/2019  . Type 2 diabetes mellitus with foot ulcer ([place])   . Anemia, chronic disease 11/10/2019  . Acute osteomyelitis of left foot ([place]) 11/10/2019  . Marijuana use 11/10/2019  . Wound dehiscence 08/31/2019  . Osteomyelitis due to type 2  diabetes mellitus ([place]) 08/14/2019  . Acute osteomyelitis of toe of left foot ([place]) 08/14/2019  . Sepsis ([place]) 08/14/2019  . Hyponatremia 08/14/2019  . Normocytic anemia 08/14/2019  . B12 deficiency 08/14/2019  . Subacute osteomyelitis, left ankle and foot ([place])   . Cellulitis of toe, left   . Diabetic infection of left foot ([place])   . Toe fracture, left 07/13/2019  . Cellulitis 07/12/2019  . GERD (gastroesophageal reflux disease) 12/19/2018  . Hepatic steatosis 06/18/2018  . Gastritis, erosive   . Dysphagia, idiopathic 12/27/2017  . Rectal bleeding 12/27/2017  . Intention tremor 06/03/2016  . Left knee pain 12/04/2015  . Alcoholic peripheral neuropathy ([place]) 10/08/2014  . Diabetic neuropathy ([place]) 10/08/2014  . Chest pain, rule out acute myocardial infarction 03/10/2014  . Thrombocytopenia ([place]) 03/10/2014  . ETOH abuse 03/10/2014  . Chest pain 03/10/2014  . Diabetes mellitus without complication ([place])   . Hypertension associated with type 2 diabetes mellitus ([place])   . Hyperlipidemia associated with type 2 diabetes mellitus ([place])    Past Medical History:  Diagnosis Date  . Alcohol abuse   . Alcoholic peripheral neuropathy ([place])   . Anxiety   . Arthritis   . Asthma    followed by pcp  . B12 deficiency   . Depression   . Diabetic neuropathy ([place])   . Edema of both lower extremities   . Essential tremor    neurologist-- dr patel--- due to alcohol abuse  . GERD (gastroesophageal reflux disease)   . History of cellulitis 2020   left lower leg;   recurrent 03-25-2020  . History of esophageal stricture    s/p  dilatation 02-13-2018  . History of osteomyelitis 2020   left great toe    . Hypercholesterolemia   . Hypertension    followed by pcp  (nuclear stress test 03-11-2014 low risk w/ no ischemia, ef 65%  . Normocytic anemia    followed by pcp   (03-18-2020 had transfusion's 02/ 2021)  . Peripheral vascular disease ([place])   . 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 Thrombocytopenia (HCC)    chronic  . Type 2 diabetes mellitus ([place])    followed by pcp---    (03-18-2020  pt stated does not check blood sugar)  Family History  Problem Relation Age of Onset  . Heart attack Mother        Living, 22  . Cancer Father        Deceased  . Healthy Brother   . Healthy Sister   . Colon cancer Neg Hx   . Colon polyps Neg Hx     Past Surgical History:  Procedure Laterality Date  . AMPUTATION Left 10/16/2019   Procedure: Left partial second ray resection; placement of antibiotic beads;  Surgeon: Evelina Bucy, DPM;  [facility];  Service: Podiatry;  Laterality: Left;  . AMPUTATION Left 11/13/2019   Procedure: Left Midfoot Amputation - Transmetatarsal vs. Lisfranc; Placement antibiotic beads;  Surgeon: Evelina Bucy, DPM;  [facility];  Service: Podiatry;  Laterality: Left;  . AMPUTATION Left 12/21/2019   Procedure: Chopart Amputation left foot;  Surgeon: Evelina Bucy, DPM;  Location: [place];  Service: Podiatry;  Laterality: Left;  . AMPUTATION Left 06/24/2020   Procedure: LEFT BELOW KNEE AMPUTATION;  Surgeon: Newt Minion, MD;  Location: [place];  Service: Orthopedics;  Laterality: Left;  . AMPUTATION TOE Left 08/14/2019   Procedure: AMPUTATION GREAT TOE;  Surgeon: Evelina Bucy, DPM;  [facility];  Service: Podiatry;  Laterality: Left;  . APPLICATION OF WOUND VAC Left 12/21/2019   Procedure: Application Of Wound Vac;  Surgeon: Evelina Bucy, DPM;  Location: [place];  Service: Podiatry;  Laterality: Left;  . BIOPSY  02/13/2018   Procedure: BIOPSY;  Surgeon: Danie Binder, MD;  [facility];  Service: Endoscopy;;  transverse colon biopsy, gastric biopsy  .  COLONOSCOPY WITH PROPOFOL N/A 02/13/2018   Procedure: COLONOSCOPY WITH PROPOFOL;  Surgeon: Danie Binder, MD;  [facility];  Service: Endoscopy;  Laterality: N/A;  11:15am  . ESOPHAGOGASTRODUODENOSCOPY (EGD) WITH PROPOFOL N/A 02/13/2018   Procedure: ESOPHAGOGASTRODUODENOSCOPY (EGD) WITH PROPOFOL;  Surgeon: Danie Binder, MD;  [facility];  Service: Endoscopy;  Laterality: N/A;  . I & D EXTREMITY Left 07/16/2019   Procedure: Insicion  AND DEBRIDEMENT EXTREMITY;  Surgeon: Evelina Bucy, DPM;  Location: [place];  Service: Podiatry;  Laterality: Left;  . I & D EXTREMITY Left 06/22/2020   Procedure: IRRIGATION AND DEBRIDEMENT EXTREMITY, left foot and ankle;  Surgeon: Evelina Bucy, DPM;  Location: [facility];  Service: Podiatry;  Laterality: Left;  . INCISION AND DRAINAGE OF WOUND Left 10/13/2019   Procedure: IRRIGATION AND DEBRIDEMENT WOUND OF LEFT FOOT AND FIRST METATARSAL RESECTION;  Surgeon: Evelina Bucy, DPM;  [facility];  Service: Podiatry;  Laterality: Left;  . IRRIGATION AND DEBRIDEMENT FOOT Left 11/11/2019   Procedure: Left Foot Wound Irrigation and Debridement;  Surgeon: Evelina Bucy, DPM;  [facility];  Service: Podiatry;  Laterality: Left;  . Left arm     Left arm repair (tendon and artery)  . PERCUTANEOUS PINNING  07/16/2019   Procedure: Open Reduction Percutaneous Pinning Extremity;  Surgeon: Evelina Bucy, DPM;  Location: [place];  Service: Podiatry;;  . PILONIDAL CYST EXCISION N/A 08/08/2014   Procedure: CYST EXCISION PILONIDAL EXTENSIVE;  Surgeon: Jamesetta So, MD;  [facility];  Service: General;  Laterality: N/A;  . POLYPECTOMY  02/13/2018   Procedure: POLYPECTOMY;  Surgeon: Danie Binder, MD;  [facility];  Service: Endoscopy;;  transverse colon polyp hs, rectal polyps times 2  . SAVORY DILATION N/A 02/13/2018   Procedure: SAVORY DILATION;  Surgeon: Oneida Alar,  Marga Melnick, MD;  [facility];  Service: Endoscopy;   Laterality: N/A;  . WOUND DEBRIDEMENT Left 09/04/2019   Procedure: DEBRIDEMENT WOUND WITH COMPLEX  REPAIR OF DEHISCENCE;  Surgeon: Evelina Bucy, DPM;  Location: [place];  Service: Podiatry;  Laterality: Left;  Leave patient on stretcher  . WOUND DEBRIDEMENT Left 10/16/2019   Procedure: Left foot wound debridement and closure;  Surgeon: Evelina Bucy, DPM;  [facility];  Service: Podiatry;  Laterality: Left;  . WOUND DEBRIDEMENT Left 12/18/2019   Procedure: LEFT FOOT DEBRIDEMENT WITH PARTIAL INCISION OF INFECTED BONE;  Surgeon: Evelina Bucy, DPM;  Location: [place];  Service: Podiatry;  Laterality: Left;  LEFT FOOT DEBRIDEMENT WITH PARTIAL INCISION OF INFECTED BONE   Social History   Occupational History  . 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  Tobacco Use  . Smoking status: Former Smoker    Packs/day: 2.50    Years: 25.00    Pack years: 62.50    Types: Cigarettes    Quit date: 08/05/1984    Years since quitting: 36.0  . Smokeless tobacco: Never Used  Vaping Use  . Vaping Use: Never used  Substance and Sexual Activity  . Alcohol use: Yes    Alcohol/week: 50.0 standard drinks    Types: 50 Standard drinks or equivalent per week    Comment: Drinks 6 beers, liquor 4-6oz (both daily) x 35 years  . Drug use: Not Currently    Types: Marijuana    Comment: 03-18-2020  per pt none since 11/ 2020  . Sexual activity: Yes    Birth control/protection: None

## 2020-08-04 NOTE — Telephone Encounter (Signed)
Pam RN called from [facility] and reports that they were unable to reach Eugene Diaz and they need a new VO to do the eval.  I have called [facility].

## 2020-08-06 ENCOUNTER — Ambulatory Visit (INDEPENDENT_AMBULATORY_CARE_PROVIDER_SITE_OTHER): Payer: Medicaid Other | Admitting: Orthopedic Surgery

## 2020-08-06 ENCOUNTER — Encounter: Payer: Self-pay | Admitting: Orthopedic Surgery

## 2020-08-06 VITALS — Ht 74.0 in | Wt 288.0 lb

## 2020-08-06 DIAGNOSIS — Z89512 Acquired absence of left leg below knee: Secondary | ICD-10-CM

## 2020-08-06 MED ORDER — DOXYCYCLINE HYCLATE 100 MG PO TABS: 100.0000 mg | ORAL_TABLET | Freq: Two times a day (BID) | ORAL | 0 refills | Status: DC

## 2020-08-06 NOTE — Progress Notes (Signed)
Office Visit Note   Patient: Eugene Diaz           Date of Birth: November 20, 1965           MRN: [id number] Visit Date: 08/06/2020              Requested by: [name], [place],  [place] [address] PCP: [name], PA-C  Chief Complaint  Patient presents with  . Left Leg - Routine Post Op    06/24/20 left BKA       HPI: Patient is a 54 year old gentleman who presents about 5 weeks status post left transtibial amputation he states that he usually wears his shrinker but he states it stretched out and broken down. He complains of swelling brawny edema and some drainage denies any concerns has been working with [employer].  Assessment & Plan: Visit Diagnoses:  1. Left below-knee amputee [facility])     Plan: Patient was provided a prescription for [employer] for his stump shrinker a prosthesis K3 level ambulator.  Follow-Up Instructions: Return in about 2 weeks (around 08/20/2020).   Ortho Exam  Patient is alert, oriented, no adenopathy, well-dressed, normal affect, normal respiratory effort. Examination patient has brawny edema there is some mild wound dehiscence medially and laterally however after debridement there is healthy granulation tissue at the base with serosanguineous drainage no purulence no abscess no tenderness to palpation no signs of infection.  Patient is a new left transtibial  amputee.  Patient's current comorbidities are not expected to impact the ability to function with the prescribed prosthesis. Patient verbally communicates a strong desire to use a prosthesis. Patient currently requires mobility aids to ambulate without a prosthesis.  Expects not to use mobility aids with a new prosthesis.  Patient is a K3 level ambulator that spends a lot of time walking around on uneven terrain over obstacles, up and down stairs, and ambulates with a variable cadence.  Imaging: No results found. No images are attached to the encounter.   Labs: Lab Results  Component Value Date   HGBA1C 6.0 (H) 06/21/2020   HGBA1C 4.7 (L) 01/17/2020   HGBA1C 6.4 (H) 11/10/2019   ESRSEDRATE 120 (H) 06/21/2020   ESRSEDRATE 40 (H) 01/18/2020   ESRSEDRATE 97 (H) 11/10/2019   CRP 20.9 (H) 06/21/2020   CRP 14.1 (H) 01/19/2020   CRP 7.6 (H) 11/10/2019   REPTSTATUS 06/27/2020 FINAL 06/22/2020   GRAMSTAIN NO WBC SEEN NO ORGANISMS SEEN  06/22/2020   CULT  06/22/2020    RARE STAPHYLOCOCCUS AUREUS CRITICAL RESULT CALLED TO, READ BACK BY AND VERIFIED WITH: RN G.ANIRA AT 2542 ON 06/23/20 BY T.SAAD NO ANAEROBES ISOLATED Performed at [facility], [place] [address], [place],  [address]    LABORGA STAPHYLOCOCCUS AUREUS 06/22/2020     Lab Results  Component Value Date   ALBUMIN 2.7 (L) 07/01/2020   ALBUMIN 2.0 (L) 06/24/2020   ALBUMIN 1.9 (L) 06/23/2020   PREALBUMIN 14.0 (L) 10/11/2019    Lab Results  Component Value Date   MG 1.9 06/24/2020   MG 1.7 06/23/2020   MG 1.9 06/21/2020   No results found for: [facility]  Lab Results  Component Value Date   PREALBUMIN 14.0 (L) 10/11/2019   CBC EXTENDED Latest Ref Rng & Units 07/06/2020 07/01/2020 06/28/2020  WBC 4.0 - 10.5 K/uL 4.4 5.9 7.5  RBC 4.22 - 5.81 MIL/uL 3.04(L) 3.24(L) 2.99(L)  HGB 13.0 - 17.0 g/dL 8.0(L) 8.4(L) 7.8(L)  HCT 39 -  52 % 26.1(L) 27.7(L) 25.4(L)  PLT 150 - 400 K/uL 199 288 313  NEUTROABS 1.7 - 7.7 K/uL - 3.6 -  LYMPHSABS 0.7 - 4.0 K/uL - 1.3 -     Body mass index is 36.98 kg/m.  Orders:  No orders of the defined types were placed in this encounter.  Meds ordered this encounter  Medications  . doxycycline (VIBRA-TABS) 100 MG tablet    Sig: Take 1 tablet (100 mg total) by mouth 2 (two) times daily.    Dispense:  60 tablet    Refill:  0     Procedures: No procedures performed  Clinical Data: No additional findings.  ROS:  All other systems negative, except as noted in the HPI. Review of Systems  Objective: Vital Signs: Ht 6\' 2"  (1.88 m)    Wt 288 lb (130.6 kg)   BMI 36.98 kg/m   Specialty Comments:  No specialty comments available.  PMFS History: Patient Active Problem List   Diagnosis Date Noted  . Unilateral complete BKA, left, initial encounter ([place]) 06/30/2020  . Severe protein-calorie malnutrition ([place])   . Acute blood loss anemia 01/04/2020  . Asthma   . Depression with anxiety   . Osteomyelitis of ankle or foot, acute, left ([place]) 12/17/2019  . Hyperglycemia 12/17/2019  . Type 2 diabetes mellitus with foot ulcer ([place])   . Anemia, chronic disease 11/10/2019  . Acute osteomyelitis of left foot ([place]) 11/10/2019  . Marijuana use 11/10/2019  . Wound dehiscence 08/31/2019  . Osteomyelitis due to type 2 diabetes mellitus ([place]) 08/14/2019  . Acute osteomyelitis of toe of left foot ([place]) 08/14/2019  . Sepsis ([place]) 08/14/2019  . Hyponatremia 08/14/2019  . Normocytic anemia 08/14/2019  . B12 deficiency 08/14/2019  . Subacute osteomyelitis, left ankle and foot ([place])   . Cellulitis of toe, left   . Diabetic infection of left foot ([place])   . Toe fracture, left 07/13/2019  . Cellulitis 07/12/2019  . GERD (gastroesophageal reflux disease) 12/19/2018  . Hepatic steatosis 06/18/2018  . Gastritis, erosive   . Dysphagia, idiopathic 12/27/2017  . Rectal bleeding 12/27/2017  . Intention tremor 06/03/2016  . Left knee pain 12/04/2015  . Alcoholic peripheral neuropathy ([place]) 10/08/2014  . Diabetic neuropathy ([place]) 10/08/2014  . Chest pain, rule out acute myocardial infarction 03/10/2014  . Thrombocytopenia ([place]) 03/10/2014  . ETOH abuse 03/10/2014  . Chest pain 03/10/2014  . Diabetes mellitus without complication ([place])   . Hypertension associated with type 2 diabetes mellitus ([place])   . Hyperlipidemia associated with type 2 diabetes mellitus ([place])    Past Medical History:  Diagnosis Date  . Alcohol abuse   . Alcoholic peripheral neuropathy ([place])   . Anxiety   . Arthritis   . Asthma    followed by pcp  . B12  deficiency   . Depression   . Diabetic neuropathy ([place])   . Edema of both lower extremities   . Essential tremor    neurologist-- dr patel--- due to alcohol abuse  . GERD (gastroesophageal reflux disease)   . History of cellulitis 2020   left lower leg;   recurrent 03-25-2020  . History of esophageal stricture    s/p  dilatation 02-13-2018  . History of osteomyelitis 2020   left great toe    . Hypercholesterolemia   . Hypertension    followed by pcp  (nuclear stress test 03-11-2014 low risk w/ no ischemia, ef 65%  . Normocytic anemia    followed by pcp   (  03-18-2020 had transfusion's 02/ 2021)  . Peripheral vascular disease ([place])   . 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 Thrombocytopenia (HCC)    chronic  . Type 2 diabetes mellitus ([place])    followed by pcp---    (03-18-2020  pt stated does not check blood sugar)    Family History  Problem Relation Age of Onset  . Heart attack Mother        Living, 78  . Cancer Father        Deceased  . Healthy Brother   . Healthy Sister   . Colon cancer Neg Hx   . Colon polyps Neg Hx     Past Surgical History:  Procedure Laterality Date  . AMPUTATION Left 10/16/2019   Procedure: Left partial second ray resection; placement of antibiotic beads;  Surgeon: Evelina Bucy, DPM;  [facility];  Service: Podiatry;  Laterality: Left;  . AMPUTATION Left 11/13/2019   Procedure: Left Midfoot Amputation - Transmetatarsal vs. Lisfranc; Placement antibiotic beads;  Surgeon: Evelina Bucy, DPM;  [facility];  Service: Podiatry;  Laterality: Left;  . AMPUTATION Left 12/21/2019   Procedure: Chopart Amputation left foot;  Surgeon: Evelina Bucy, DPM;  Location: [facility];   Service: Podiatry;  Laterality: Left;  . AMPUTATION Left 06/24/2020   Procedure: LEFT BELOW KNEE AMPUTATION;  Surgeon: Newt Minion, MD;  Location: [place];  Service: Orthopedics;  Laterality: Left;  . AMPUTATION TOE Left 08/14/2019   Procedure: AMPUTATION GREAT TOE;  Surgeon: Evelina Bucy, DPM;  [facility];  Service: Podiatry;  Laterality: Left;  . APPLICATION OF WOUND VAC Left 12/21/2019   Procedure: Application Of Wound Vac;  Surgeon: Evelina Bucy, DPM;  Location: [place];  Service: Podiatry;  Laterality: Left;  . BIOPSY  02/13/2018   Procedure: BIOPSY;  Surgeon: Danie Binder, MD;  [facility];  Service: Endoscopy;;  transverse colon biopsy, gastric biopsy  . COLONOSCOPY WITH PROPOFOL N/A 02/13/2018   Procedure: COLONOSCOPY WITH PROPOFOL;  Surgeon: Danie Binder, MD;  [facility];  Service: Endoscopy;  Laterality: N/A;  11:15am  . ESOPHAGOGASTRODUODENOSCOPY (EGD) WITH PROPOFOL N/A 02/13/2018   Procedure: ESOPHAGOGASTRODUODENOSCOPY (EGD) WITH PROPOFOL;  Surgeon: Danie Binder, MD;  [facility];  Service: Endoscopy;  Laterality: N/A;  . I & D EXTREMITY Left 07/16/2019   Procedure: Insicion  AND DEBRIDEMENT EXTREMITY;  Surgeon: Evelina Bucy, DPM;  Location: [facility];  Service: Podiatry;  Laterality: Left;  . I & D EXTREMITY Left 06/22/2020   Procedure: IRRIGATION AND DEBRIDEMENT EXTREMITY, left foot and ankle;  Surgeon: Evelina Bucy, DPM;  Location: [facility];  Service: Podiatry;  Laterality: Left;  . INCISION AND DRAINAGE OF WOUND Left 10/13/2019   Procedure: IRRIGATION AND DEBRIDEMENT WOUND OF LEFT FOOT AND FIRST METATARSAL RESECTION;  Surgeon: Evelina Bucy, DPM;  [facility];  Service: Podiatry;  Laterality: Left;  . IRRIGATION AND DEBRIDEMENT FOOT Left 11/11/2019   Procedure: Left Foot Wound Irrigation and Debridement;  Surgeon: Evelina Bucy, DPM;  [facility];  Service: Podiatry;  Laterality: Left;  . Left arm     Left  arm  repair (tendon and artery)  . PERCUTANEOUS PINNING  07/16/2019   Procedure: Open Reduction Percutaneous Pinning Extremity;  Surgeon: Evelina Bucy, DPM;  Location: [place];  Service: Podiatry;;  . PILONIDAL CYST EXCISION N/A 08/08/2014   Procedure: CYST EXCISION PILONIDAL EXTENSIVE;  Surgeon: Jamesetta So, MD;  [facility];  Service: General;  Laterality: N/A;  . POLYPECTOMY  02/13/2018   Procedure: POLYPECTOMY;  Surgeon: Danie Binder, MD;  [facility];  Service: Endoscopy;;  transverse colon polyp hs, rectal polyps times 2  . SAVORY DILATION N/A 02/13/2018   Procedure: SAVORY DILATION;  Surgeon: Danie Binder, MD;  [facility];  Service: Endoscopy;  Laterality: N/A;  . WOUND DEBRIDEMENT Left 09/04/2019   Procedure: DEBRIDEMENT WOUND WITH COMPLEX  REPAIR OF DEHISCENCE;  Surgeon: Evelina Bucy, DPM;  Location: [facility];  Service: Podiatry;  Laterality: Left;  Leave patient on stretcher  . WOUND DEBRIDEMENT Left 10/16/2019   Procedure: Left foot wound debridement and closure;  Surgeon: Evelina Bucy, DPM;  [facility];  Service: Podiatry;  Laterality: Left;  . WOUND DEBRIDEMENT Left 12/18/2019   Procedure: LEFT FOOT DEBRIDEMENT WITH PARTIAL INCISION OF INFECTED BONE;  Surgeon: Evelina Bucy, DPM;  Location: [facility];  Service: Podiatry;  Laterality: Left;  LEFT FOOT DEBRIDEMENT WITH PARTIAL INCISION OF INFECTED BONE   Social History   Occupational History  . 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  Tobacco Use  . Smoking status: Former Smoker    Packs/day: 2.50    Years: 25.00    Pack years: 62.50    Types: Cigarettes    Quit date: 08/05/1984    Years since quitting: 36.0  . Smokeless tobacco: Never Used  Vaping Use  . Vaping Use: Never used  Substance and Sexual Activity  . Alcohol use: Yes    Alcohol/week: 50.0 standard drinks    Types: 50 Standard drinks or equivalent per week     Comment: Drinks 6 beers, liquor 4-6oz (both daily) x 35 years  . Drug use: Not Currently    Types: Marijuana    Comment: 03-18-2020  per pt none since 11/ 2020  . Sexual activity: Yes    Birth control/protection: None

## 2020-08-11 ENCOUNTER — Other Ambulatory Visit: Payer: Self-pay

## 2020-08-11 ENCOUNTER — Ambulatory Visit (INDEPENDENT_AMBULATORY_CARE_PROVIDER_SITE_OTHER): Payer: Medicaid Other | Admitting: Podiatry

## 2020-08-11 VITALS — Temp 97.7°F

## 2020-08-11 DIAGNOSIS — L97511 Non-pressure chronic ulcer of other part of right foot limited to breakdown of skin: Secondary | ICD-10-CM | POA: Diagnosis not present

## 2020-08-11 NOTE — Progress Notes (Signed)
  Subjective:  Patient ID: Eugene Diaz, male    DOB: 22-Sep-1966,  MRN: [id number]  Chief Complaint  Patient presents with  . Diabetic Ulcer    Follow up after BK Amputation. Pt states stump is healing well and denies any concerns. Denies fever/nausea/vomiting/chills.    54 y.o. male presents with the above complaint. History confirmed with patient.   Objective:  Physical Exam: warm, good capillary refill, no trophic changes or ulcerative lesions, normal DP and PT pulses and normal sensory exam. Left Foot: BKA site healing with small areas of incomplete blistering, serous drainage. No warmth, erythema, signs of acute infection Right Foot: Small ulcerations PIPJ 2nd toe, 4th toe, no warmth, erythema, signs of infection Assessment:   1. Ulcer of toe, right, limited to breakdown of skin [facility])    Plan:  Patient was evaluated and treated and all questions answered.  Ulcers right 2nd/4th toes -Dressed with abx oitnment and band-aid -Pt to do so daily -Would benefit from DM shoes  Hx of Left BKA -Defer care to Dr. Sharol Given -Dressed with abx ointment and Kling  No follow-ups on file.

## 2020-08-12 ENCOUNTER — Telehealth: Payer: Self-pay | Admitting: *Deleted

## 2020-08-12 NOTE — Telephone Encounter (Signed)
[facility] OT called to update on MR Eugene Diaz.  He is doing better with his BP--last was 163/79 and remained stable throughout the OT exercise. He is trying to be more compliant though he is still struggling.

## 2020-08-13 ENCOUNTER — Telehealth: Payer: Self-pay

## 2020-08-13 NOTE — Telephone Encounter (Signed)
Myriam Jacobson RN with [facility] called:   There have been multiple attempts to reach Mr. Heap & his secondary contact. However no reply. So, there will not be no PT this week.

## 2020-08-18 ENCOUNTER — Telehealth: Payer: Self-pay

## 2020-08-18 NOTE — Telephone Encounter (Signed)
Eugene Diaz has been discharged from [facility] today, for non compliance.  Eugene Diaz is not taking his medication as prescribed. Today his  blood pressure reading is 180/90. (No other details given).   Nurse  is aware Dr. Dagoberto Ligas is not in the office this week. However she will be informed. And to please call his PCP. To inform them of his current status.

## 2020-08-19 ENCOUNTER — Telehealth: Payer: Self-pay | Admitting: Podiatry

## 2020-08-19 NOTE — Telephone Encounter (Signed)
Pt left message @ 353 today wanting to get a call back regarding his appt time for tomorrow.   I returned call and left message that we did not have him scheduled to see Dr [name] until 11.16 but he is scheduled to see Dr Sharol Given tomorrow @ 100pm.

## 2020-08-20 ENCOUNTER — Encounter: Payer: Self-pay | Admitting: Orthopedic Surgery

## 2020-08-20 ENCOUNTER — Ambulatory Visit (INDEPENDENT_AMBULATORY_CARE_PROVIDER_SITE_OTHER): Payer: Medicaid Other | Admitting: Physician Assistant

## 2020-08-20 VITALS — Ht 74.0 in | Wt 288.0 lb

## 2020-08-20 DIAGNOSIS — M869 Osteomyelitis, unspecified: Secondary | ICD-10-CM

## 2020-08-20 DIAGNOSIS — E1169 Type 2 diabetes mellitus with other specified complication: Secondary | ICD-10-CM

## 2020-08-20 NOTE — Progress Notes (Signed)
Office Visit Note   Patient: Eugene Diaz           Date of Birth: 1966/03/22           MRN: [id number] Visit Date: 08/20/2020              Requested by: [name], [place],  [place] [address] PCP: [name], PA-C  Chief Complaint  Patient presents with  . 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  He is working with Museum/gallery curator.  He has an appointment upcoming.  He is not wearing his shrinker today because he states that his family is washing his shrinkers for him  Assessment & Plan: Visit Diagnoses: No diagnosis found.  Plan: I emphasized the importance of elevating his leg and wearing shrinkers.  He still has some significant swelling though his incision is overall well-healed.  Follow-up in 3 weeks.  Follow-Up Instructions: No follow-ups on file.   Ortho Exam  Patient is alert, oriented, no adenopathy, well-dressed, normal affect, normal respiratory effort. Focused examination demonstrates well healed surgical incision.  He does have quite a bit of swelling with some weeping.  No wound dehiscence no ascending cellulitis  Imaging: No results found. No images are attached to the encounter.  Labs: Lab Results  Component Value Date   HGBA1C 6.0 (H) 06/21/2020   HGBA1C 4.7 (L) 01/17/2020   HGBA1C 6.4 (H) 11/10/2019   ESRSEDRATE 120 (H) 06/21/2020   ESRSEDRATE 40 (H) 01/18/2020   ESRSEDRATE 97 (H) 11/10/2019   CRP 20.9 (H) 06/21/2020   CRP 14.1 (H) 01/19/2020   CRP 7.6 (H) 11/10/2019   REPTSTATUS 06/27/2020 FINAL 06/22/2020   GRAMSTAIN NO WBC SEEN NO ORGANISMS SEEN  06/22/2020   CULT  06/22/2020    RARE STAPHYLOCOCCUS AUREUS CRITICAL RESULT CALLED TO, READ BACK BY AND VERIFIED WITH: RN G.ANIRA AT 7741 ON 06/23/20 BY T.SAAD NO ANAEROBES ISOLATED Performed at [facility], [place] [address], [place], [place] [address]    LABORGA  STAPHYLOCOCCUS AUREUS 06/22/2020     Lab Results  Component Value Date   ALBUMIN 2.7 (L) 07/01/2020   ALBUMIN 2.0 (L) 06/24/2020   ALBUMIN 1.9 (L) 06/23/2020   PREALBUMIN 14.0 (L) 10/11/2019    Lab Results  Component Value Date   MG 1.9 06/24/2020   MG 1.7 06/23/2020   MG 1.9 06/21/2020   No results found for: [facility]  Lab Results  Component Value Date   PREALBUMIN 14.0 (L) 10/11/2019   CBC EXTENDED Latest Ref Rng & Units 07/06/2020 07/01/2020 06/28/2020  WBC 4.0 - 10.5 K/uL 4.4 5.9 7.5  RBC 4.22 - 5.81 MIL/uL 3.04(L) 3.24(L) 2.99(L)  HGB 13.0 - 17.0 g/dL 8.0(L) 8.4(L) 7.8(L)  HCT 39 - 52 % 26.1(L) 27.7(L) 25.4(L)  PLT 150 - 400 K/uL 199 288 313  NEUTROABS 1.7 - 7.7 K/uL - 3.6 -  LYMPHSABS 0.7 - 4.0 K/uL - 1.3 -     Body mass index is 36.98 kg/m.  Orders:  No orders of the defined types were placed in this encounter.  No orders of the defined types were placed in this encounter.    Procedures: No procedures performed  Clinical Data: No additional findings.  ROS:  All other systems negative, except as noted in the HPI. Review of Systems  Objective: Vital Signs: Ht 6'  2" (1.88 m)   Wt 288 lb (130.6 kg)   BMI 36.98 kg/m   Specialty Comments:  No specialty comments available.  PMFS History: Patient Active Problem List   Diagnosis Date Noted  . Unilateral complete BKA, left, initial encounter ([place]) 06/30/2020  . Severe protein-calorie malnutrition ([place])   . Acute blood loss anemia 01/04/2020  . Asthma   . Depression with anxiety   . Osteomyelitis of ankle or foot, acute, left ([place]) 12/17/2019  . Hyperglycemia 12/17/2019  . Type 2 diabetes mellitus with foot ulcer ([place])   . Anemia, chronic disease 11/10/2019  . Acute osteomyelitis of left foot ([place]) 11/10/2019  . Marijuana use 11/10/2019  . Wound dehiscence 08/31/2019  . Osteomyelitis due to type 2 diabetes mellitus ([place]) 08/14/2019  . Acute osteomyelitis of toe of left foot ([place]) 08/14/2019  .  Sepsis ([place]) 08/14/2019  . Hyponatremia 08/14/2019  . Normocytic anemia 08/14/2019  . B12 deficiency 08/14/2019  . Subacute osteomyelitis, left ankle and foot ([place])   . Cellulitis of toe, left   . Diabetic infection of left foot ([place])   . Toe fracture, left 07/13/2019  . Cellulitis 07/12/2019  . GERD (gastroesophageal reflux disease) 12/19/2018  . Hepatic steatosis 06/18/2018  . Gastritis, erosive   . Dysphagia, idiopathic 12/27/2017  . Rectal bleeding 12/27/2017  . Intention tremor 06/03/2016  . Left knee pain 12/04/2015  . Alcoholic peripheral neuropathy ([place]) 10/08/2014  . Diabetic neuropathy ([place]) 10/08/2014  . Chest pain, rule out acute myocardial infarction 03/10/2014  . Thrombocytopenia ([place]) 03/10/2014  . ETOH abuse 03/10/2014  . Chest pain 03/10/2014  . Diabetes mellitus without complication ([place])   . Hypertension associated with type 2 diabetes mellitus ([place])   . Hyperlipidemia associated with type 2 diabetes mellitus ([place])    Past Medical History:  Diagnosis Date  . Alcohol abuse   . Alcoholic peripheral neuropathy ([place])   . Anxiety   . Arthritis   . Asthma    followed by pcp  . B12 deficiency   . Depression   . Diabetic neuropathy ([place])   . Edema of both lower extremities   . Essential tremor    neurologist-- dr patel--- due to alcohol abuse  . GERD (gastroesophageal reflux disease)   . History of cellulitis 2020   left lower leg;   recurrent 03-25-2020  . History of esophageal stricture    s/p  dilatation 02-13-2018  . History of osteomyelitis 2020   left great toe    . Hypercholesterolemia   . Hypertension    followed by pcp  (nuclear stress test 03-11-2014 low risk w/ no ischemia, ef 65%  . Normocytic anemia    followed by pcp   (03-18-2020 had transfusion's 02/ 2021)  . Peripheral vascular disease ([place])   . 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 Thrombocytopenia ([facility])    chronic  . Type 2 diabetes mellitus ([place])    followed by pcp---    (03-18-2020  pt stated does not check blood sugar)    Family History  Problem Relation Age of Onset  . Heart attack Mother        Living, 57  . Cancer Father  Deceased  . Healthy Brother   . Healthy Sister   . Colon cancer Neg Hx   . Colon polyps Neg Hx     Past Surgical History:  Procedure Laterality Date  . AMPUTATION Left 10/16/2019   Procedure: Left partial second ray resection; placement of antibiotic beads;  Surgeon: Evelina Bucy, DPM;  Location: [facility];  Service: Podiatry;  Laterality: Left;  . AMPUTATION Left 11/13/2019   Procedure: Left Midfoot Amputation - Transmetatarsal vs. Lisfranc; Placement antibiotic beads;  Surgeon: Evelina Bucy, DPM;  Location: [facility];  Service: Podiatry;  Laterality: Left;  . AMPUTATION Left 12/21/2019   Procedure: Chopart Amputation left foot;  Surgeon: Evelina Bucy, DPM;  Location: [facility];  Service: Podiatry;  Laterality: Left;  . AMPUTATION Left 06/24/2020   Procedure: LEFT BELOW KNEE AMPUTATION;  Surgeon: Newt Minion, MD;  Location: [facility];  Service: Orthopedics;  Laterality: Left;  . AMPUTATION TOE Left 08/14/2019   Procedure: AMPUTATION GREAT TOE;  Surgeon: Evelina Bucy, DPM;  Location: [facility];  Service: Podiatry;  Laterality: Left;  . APPLICATION OF WOUND VAC Left 12/21/2019   Procedure: Application Of Wound Vac;  Surgeon: Evelina Bucy, DPM;  Location: [facility];  Service: Podiatry;  Laterality: Left;  . BIOPSY  02/13/2018   Procedure: BIOPSY;  Surgeon: Danie Binder, MD;  [facility];  Service: Endoscopy;;  transverse colon biopsy, gastric biopsy  . COLONOSCOPY WITH PROPOFOL N/A 02/13/2018   Procedure: COLONOSCOPY WITH PROPOFOL;  Surgeon: Danie Binder, MD;  [facility];  Service: Endoscopy;  Laterality: N/A;  11:15am  . ESOPHAGOGASTRODUODENOSCOPY (EGD) WITH PROPOFOL N/A 02/13/2018   Procedure: ESOPHAGOGASTRODUODENOSCOPY (EGD) WITH PROPOFOL;  Surgeon: Danie Binder, MD;  [facility];  Service: Endoscopy;  Laterality: N/A;  . I & D EXTREMITY Left 07/16/2019   Procedure: Insicion  AND DEBRIDEMENT EXTREMITY;  Surgeon: Evelina Bucy, DPM;  Location: [facility];  Service: Podiatry;  Laterality: Left;  . I & D EXTREMITY Left 06/22/2020   Procedure: IRRIGATION AND DEBRIDEMENT EXTREMITY, left foot and ankle;  Surgeon: Evelina Bucy, DPM;  Location: [facility];  Service: Podiatry;  Laterality: Left;  . INCISION AND DRAINAGE OF WOUND Left 10/13/2019   Procedure: IRRIGATION AND DEBRIDEMENT WOUND OF LEFT FOOT AND FIRST METATARSAL RESECTION;  Surgeon: Evelina Bucy, DPM;  Location: [facility];  Service: Podiatry;  Laterality: Left;  . IRRIGATION AND DEBRIDEMENT FOOT Left 11/11/2019   Procedure: Left Foot Wound Irrigation and Debridement;  Surgeon: Evelina Bucy, DPM;  [facility];  Service: Podiatry;  Laterality: Left;  . Left arm     Left arm repair (tendon and artery)  . PERCUTANEOUS PINNING  07/16/2019   Procedure: Open Reduction Percutaneous Pinning Extremity;  Surgeon: Evelina Bucy, DPM;  Location: [facility];  Service: Podiatry;;  . PILONIDAL CYST EXCISION N/A 08/08/2014   Procedure: CYST EXCISION PILONIDAL EXTENSIVE;  Surgeon: Jamesetta So, MD;  Location: [facility];  Service: General;  Laterality: N/A;  . POLYPECTOMY  02/13/2018   Procedure: POLYPECTOMY;  Surgeon: Danie Binder, MD;  [facility];  Service: Endoscopy;;  transverse colon polyp hs, rectal polyps times 2  . SAVORY DILATION N/A 02/13/2018   Procedure: SAVORY DILATION;  Surgeon: Danie Binder, MD;  [facility];  Service: Endoscopy;  Laterality: N/A;  . WOUND DEBRIDEMENT Left 09/04/2019   Procedure: DEBRIDEMENT WOUND WITH COMPLEX  REPAIR OF  DEHISCENCE;  Surgeon: Evelina Bucy, DPM;  Location: [facility];  Service: Podiatry;  Laterality: Left;  Leave patient on stretcher  . WOUND DEBRIDEMENT Left 10/16/2019   Procedure: Left foot wound debridement and closure;  Surgeon: Evelina Bucy, DPM;  [facility];  Service: Podiatry;  Laterality: Left;  . WOUND DEBRIDEMENT Left 12/18/2019   Procedure: LEFT FOOT DEBRIDEMENT WITH PARTIAL INCISION OF INFECTED BONE;  Surgeon: Evelina Bucy, DPM;  Location: [facility];  Service: Podiatry;  Laterality: Left;  LEFT FOOT DEBRIDEMENT WITH PARTIAL INCISION OF INFECTED BONE   Social History   Occupational History  . 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  Tobacco Use  . Smoking status: Former Smoker    Packs/day: 2.50    Years: 25.00    Pack years: 62.50    Types: Cigarettes    Quit date: 08/05/1984    Years since quitting: 36.0  . Smokeless tobacco: Never Used  Vaping Use  . Vaping Use: Never used  Substance and Sexual Activity  . Alcohol use: Yes    Alcohol/week: 50.0 standard drinks    Types: 50 Standard drinks or equivalent per week    Comment: Drinks 6 beers, liquor 4-6oz (both daily) x 35 years  . Drug use: Not Currently    Types: Marijuana    Comment: 03-18-2020  per pt none since 11/ 2020  . Sexual activity: Yes    Birth control/protection: None

## 2020-08-31 ENCOUNTER — Encounter: Payer: Medicaid Other | Attending: Registered Nurse | Admitting: Physical Medicine and Rehabilitation

## 2020-08-31 DIAGNOSIS — I10 Essential (primary) hypertension: Secondary | ICD-10-CM | POA: Insufficient documentation

## 2020-08-31 DIAGNOSIS — S88112A Complete traumatic amputation at level between knee and ankle, left lower leg, initial encounter: Secondary | ICD-10-CM | POA: Insufficient documentation

## 2020-09-05 ENCOUNTER — Other Ambulatory Visit: Payer: Self-pay | Admitting: Neurology

## 2020-09-13 ENCOUNTER — Encounter (HOSPITAL_COMMUNITY): Payer: Self-pay

## 2020-09-13 ENCOUNTER — Other Ambulatory Visit: Payer: Self-pay

## 2020-09-13 ENCOUNTER — Emergency Department (HOSPITAL_COMMUNITY): Payer: Medicaid Other

## 2020-09-13 ENCOUNTER — Emergency Department (HOSPITAL_COMMUNITY)
Admission: EM | Admit: 2020-09-13 | Discharge: 2020-09-13 | Disposition: A | Discharge status: Home / Self Care | Payer: Medicaid Other | Attending: Emergency Medicine | Admitting: Emergency Medicine

## 2020-09-13 DIAGNOSIS — E1165 Type 2 diabetes mellitus with hyperglycemia: Secondary | ICD-10-CM | POA: Insufficient documentation

## 2020-09-13 DIAGNOSIS — Z87891 Personal history of nicotine dependence: Secondary | ICD-10-CM | POA: Diagnosis not present

## 2020-09-13 DIAGNOSIS — J45909 Unspecified asthma, uncomplicated: Secondary | ICD-10-CM | POA: Insufficient documentation

## 2020-09-13 DIAGNOSIS — I10 Essential (primary) hypertension: Secondary | ICD-10-CM | POA: Diagnosis not present

## 2020-09-13 DIAGNOSIS — L03115 Cellulitis of right lower limb: Secondary | ICD-10-CM | POA: Diagnosis not present

## 2020-09-13 DIAGNOSIS — Z5189 Encounter for other specified aftercare: Secondary | ICD-10-CM

## 2020-09-13 DIAGNOSIS — L539 Erythematous condition, unspecified: Secondary | ICD-10-CM | POA: Diagnosis present

## 2020-09-13 DIAGNOSIS — Z4800 Encounter for change or removal of nonsurgical wound dressing: Secondary | ICD-10-CM | POA: Diagnosis not present

## 2020-09-13 LAB — CBG MONITORING, ED: Glucose-Capillary: 105 mg/dL — ABNORMAL HIGH (ref 70–99)

## 2020-09-13 MED ORDER — DOXYCYCLINE HYCLATE 100 MG PO CAPS: 100.0000 mg | ORAL_CAPSULE | Freq: Two times a day (BID) | ORAL | 0 refills | Status: AC

## 2020-09-13 MED ORDER — DOXYCYCLINE HYCLATE 100 MG PO TABS
100.0000 mg | ORAL_TABLET | Freq: Once | ORAL | Status: AC
  Administered 2020-09-13: 100 mg via ORAL
  Filled 2020-09-13: qty 1

## 2020-09-13 NOTE — ED Notes (Signed)
Pt has an appt for this wound on Tuesday per pt   He reports the wound has been present for about 3 weeks   There is a healing wound to pt  anterolateral eminence of R foot   There is slight redness to R lower leg as well as R foot - unknown if this is new or chronic  Per pt "my niece dragged me here"

## 2020-09-13 NOTE — ED Triage Notes (Signed)
Pt to er, pt states that he is here because his foot is "busted open" pt has a wound on his R foot, pt has a L bka.  Pt states that he has a hx of neuropathy.

## 2020-09-13 NOTE — ED Provider Notes (Signed)
Emergency Department Provider Note   I have reviewed the triage vital signs and the nursing notes.   HISTORY  Chief Complaint Wound Check   HPI Eugene Diaz is a 54 y.o. male with PMH of PAD, DM, osteomyelitis, and multiple wound debridment/amputations on the left presents the ED with wound to the right foot and redness in the RLE. Patient tells me that this has been present over the last several days. No rapid progression or fever. No chills. His family member saw the leg and insistent he be seen in the ED. He has an appointment with his podiatrist on Tuesday per patient report. No recent abx. No additional wounds. Patient with history of neuropathy so denies pain in the foot.   Past Medical History:  Diagnosis Date  . Alcohol abuse   . Alcoholic peripheral neuropathy ([place])   . Anxiety   . Arthritis   . Asthma    followed by pcp  . B12 deficiency   . Depression   . Diabetic neuropathy ([place])   . Edema of both lower extremities   . Essential tremor    neurologist-- dr patel--- due to alcohol abuse  . GERD (gastroesophageal reflux disease)   . History of cellulitis 2020   left lower leg;   recurrent 03-25-2020  . History of esophageal stricture    s/p  dilatation 02-13-2018  . History of osteomyelitis 2020   left great toe    . Hypercholesterolemia   . Hypertension    followed by pcp  (nuclear stress test 03-11-2014 low risk w/ no ischemia, ef 65%  . Normocytic anemia    followed by pcp   (03-18-2020 had transfusion's 02/ 2021)  . Peripheral vascular disease ([place])   . 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 Thrombocytopenia (HCC)    chronic  . Type 2 diabetes mellitus ([place])    followed by pcp---     (03-18-2020  pt stated does not check blood sugar)    Patient Active Problem List   Diagnosis Date Noted  . Unilateral complete BKA, left, initial encounter ([place]) 06/30/2020  . Severe protein-calorie malnutrition ([place])   . Acute blood loss anemia 01/04/2020  . Asthma   . Depression with anxiety   . Osteomyelitis of ankle or foot, acute, left ([place]) 12/17/2019  . Hyperglycemia 12/17/2019  . Type 2 diabetes mellitus with foot ulcer ([place])   . Anemia, chronic disease 11/10/2019  . Acute osteomyelitis of left foot ([place]) 11/10/2019  . Marijuana use 11/10/2019  . Wound dehiscence 08/31/2019  . Osteomyelitis due to type 2 diabetes mellitus ([place]) 08/14/2019  . Acute osteomyelitis of toe of left foot ([place]) 08/14/2019  . Sepsis ([place]) 08/14/2019  . Hyponatremia 08/14/2019  . Normocytic anemia 08/14/2019  . B12 deficiency 08/14/2019  . Subacute osteomyelitis, left ankle and foot ([place])   . Cellulitis of toe, left   . Diabetic infection of left foot ([place])   . Toe fracture, left 07/13/2019  . Cellulitis 07/12/2019  . GERD (gastroesophageal reflux disease) 12/19/2018  . Hepatic steatosis 06/18/2018  . Gastritis, erosive   . Dysphagia, idiopathic 12/27/2017  . Rectal bleeding 12/27/2017  . Intention tremor 06/03/2016  .  Left knee pain 12/04/2015  . Alcoholic peripheral neuropathy ([place]) 10/08/2014  . Diabetic neuropathy ([place]) 10/08/2014  . Chest pain, rule out acute myocardial infarction 03/10/2014  . Thrombocytopenia ([place]) 03/10/2014  . ETOH abuse 03/10/2014  . Chest pain 03/10/2014  . Diabetes mellitus without complication ([place])   . Hypertension associated with type 2 diabetes mellitus ([place])   . Hyperlipidemia associated with type 2 diabetes mellitus [facility])     Past Surgical History:  Procedure Laterality Date  . AMPUTATION Left 10/16/2019   Procedure: Left partial second ray resection; placement of antibiotic beads;  Surgeon: Evelina Bucy, DPM;  Location: [facility];  Service:  Podiatry;  Laterality: Left;  . AMPUTATION Left 11/13/2019   Procedure: Left Midfoot Amputation - Transmetatarsal vs. Lisfranc; Placement antibiotic beads;  Surgeon: Evelina Bucy, DPM;  Location: [facility];  Service: Podiatry;  Laterality: Left;  . AMPUTATION Left 12/21/2019   Procedure: Chopart Amputation left foot;  Surgeon: Evelina Bucy, DPM;  Location: [place];  Service: Podiatry;  Laterality: Left;  . AMPUTATION Left 06/24/2020   Procedure: LEFT BELOW KNEE AMPUTATION;  Surgeon: Newt Minion, MD;  Location: [place];  Service: Orthopedics;  Laterality: Left;  . AMPUTATION TOE Left 08/14/2019   Procedure: AMPUTATION GREAT TOE;  Surgeon: Evelina Bucy, DPM;  [facility];  Service: Podiatry;  Laterality: Left;  . APPLICATION OF WOUND VAC Left 12/21/2019   Procedure: Application Of Wound Vac;  Surgeon: Evelina Bucy, DPM;  Location: [place];  Service: Podiatry;  Laterality: Left;  . BIOPSY  02/13/2018   Procedure: BIOPSY;  Surgeon: Danie Binder, MD;  [facility];  Service: Endoscopy;;  transverse colon biopsy, gastric biopsy  . COLONOSCOPY WITH PROPOFOL N/A 02/13/2018   Procedure: COLONOSCOPY WITH PROPOFOL;  Surgeon: Danie Binder, MD;  [facility];  Service: Endoscopy;  Laterality: N/A;  11:15am  . ESOPHAGOGASTRODUODENOSCOPY (EGD) WITH PROPOFOL N/A 02/13/2018   Procedure: ESOPHAGOGASTRODUODENOSCOPY (EGD) WITH PROPOFOL;  Surgeon: Danie Binder, MD;  [facility];  Service: Endoscopy;  Laterality: N/A;  . I & D EXTREMITY Left 07/16/2019   Procedure: Insicion  AND DEBRIDEMENT EXTREMITY;  Surgeon: Evelina Bucy, DPM;  Location: [place];  Service: Podiatry;  Laterality: Left;  . I & D EXTREMITY Left 06/22/2020   Procedure: IRRIGATION AND DEBRIDEMENT EXTREMITY, left foot and ankle;  Surgeon: Evelina Bucy, DPM;  Location: [place];  Service: Podiatry;  Laterality: Left;  . INCISION AND DRAINAGE OF WOUND Left 10/13/2019   Procedure: IRRIGATION AND  DEBRIDEMENT WOUND OF LEFT FOOT AND FIRST METATARSAL RESECTION;  Surgeon: Evelina Bucy, DPM;  [facility];  Service: Podiatry;  Laterality: Left;  . IRRIGATION AND DEBRIDEMENT FOOT Left 11/11/2019   Procedure: Left Foot Wound Irrigation and Debridement;  Surgeon: Evelina Bucy, DPM;  [facility];  Service: Podiatry;  Laterality: Left;  . Left arm     Left arm repair (tendon and artery)  . PERCUTANEOUS PINNING  07/16/2019   Procedure: Open Reduction Percutaneous Pinning Extremity;  Surgeon: Evelina Bucy, DPM;  Location: [place];  Service: Podiatry;;  . PILONIDAL CYST EXCISION N/A 08/08/2014   Procedure: CYST EXCISION PILONIDAL EXTENSIVE;  Surgeon: Jamesetta So, MD;  [facility];  Service: General;  Laterality: N/A;  . POLYPECTOMY  02/13/2018   Procedure: POLYPECTOMY;  Surgeon: Danie Binder, MD;  [facility];  Service: Endoscopy;;  transverse colon polyp hs, rectal polyps times  2  . SAVORY DILATION N/A 02/13/2018   Procedure: SAVORY DILATION;  Surgeon: Danie Binder, MD;  [facility];  Service: Endoscopy;  Laterality: N/A;  . WOUND DEBRIDEMENT Left 09/04/2019   Procedure: DEBRIDEMENT WOUND WITH COMPLEX  REPAIR OF DEHISCENCE;  Surgeon: Evelina Bucy, DPM;  Location: [place];  Service: Podiatry;  Laterality: Left;  Leave patient on stretcher  . WOUND DEBRIDEMENT Left 10/16/2019   Procedure: Left foot wound debridement and closure;  Surgeon: Evelina Bucy, DPM;  [facility];  Service: Podiatry;  Laterality: Left;  . WOUND DEBRIDEMENT Left 12/18/2019   Procedure: LEFT FOOT DEBRIDEMENT WITH PARTIAL INCISION OF INFECTED BONE;  Surgeon: Evelina Bucy, DPM;  Location: [place];  Service: Podiatry;  Laterality: Left;  LEFT FOOT DEBRIDEMENT WITH PARTIAL INCISION OF INFECTED BONE    Allergies Patient has no known allergies.  Family History  Problem Relation Age of Onset  . Heart attack Mother        Living, 55  . Cancer  Father        Deceased  . Healthy Brother   . Healthy Sister   . Colon cancer Neg Hx   . Colon polyps Neg Hx     Social History Social History   Tobacco Use  . Smoking status: Former Smoker    Packs/day: 2.50    Years: 25.00    Pack years: 62.50    Types: Cigarettes    Quit date: 08/05/1984    Years since quitting: 36.1  . Smokeless tobacco: Never Used  Vaping Use  . Vaping Use: Never used  Substance Use Topics  . Alcohol use: Yes    Alcohol/week: 50.0 standard drinks    Types: 50 Standard drinks or equivalent per week    Comment: Drinks 6 beers, liquor 4-6oz (both daily) x 35 years  . Drug use: Not Currently    Types: Marijuana    Review of Systems  Constitutional: No fever/chills Eyes: No visual changes. ENT: No sore throat. Cardiovascular: Denies chest pain. Respiratory: Denies shortness of breath. Gastrointestinal: No abdominal pain.  No nausea, no vomiting.  No diarrhea.  No constipation. Genitourinary: Negative for dysuria. Musculoskeletal: Negative for back pain. Skin: Wound to the right foot and redness to the right leg.  Neurological: Negative for headaches, focal weakness or numbness.  10-point ROS otherwise negative.  ____________________________________________   PHYSICAL EXAM:  VITAL SIGNS: ED Triage Vitals  Enc Vitals Group     BP 09/13/20 1851 (!) 197/96     Pulse Rate 09/13/20 1851 72     Resp 09/13/20 1851 20     Temp 09/13/20 1851 98.8 F (37.1 C)     Temp Source 09/13/20 1851 Oral     SpO2 09/13/20 1851 98 %     Weight 09/13/20 1854 280 lb (127 kg)     Height 09/13/20 1854 6\' 2"  (1.88 m)    Constitutional: Alert and oriented. Well appearing and in no acute distress. Eyes: Conjunctivae are normal.  Head: Atraumatic. Nose: No congestion/rhinnorhea. Mouth/Throat: Mucous membranes are moist.   Neck: No stridor Cardiovascular: Normal rate, regular rhythm. Good peripheral circulation. Grossly normal heart sounds.   Respiratory:  Normal respiratory effort.  No retractions. Lungs CTAB. Gastrointestinal: Soft and nontender. No distention.  Musculoskeletal: Left bka noted. Normal RLE.  Neurologic:  Normal speech and language. Decreased sensation to the right foot and mid-calf.  Skin:  Skin is warm and dry. No cyanosis. No necrotic tissue.  Wound to the sole of the right foot at the 1st metatarsal. No surrounding cellulitis. No drainage. Would appears shallow 48mm deep. Erythema to the RLE from the ankle to the mid-calf. Mild warmth.    ____________________________________________   LABS (all labs ordered are listed, but only abnormal results are displayed)  Labs Reviewed  CBG MONITORING, ED - Abnormal; Notable for the following components:      Result Value   Glucose-Capillary 105 (*)    All other components within normal limits   ____________________________________________  RADIOLOGY  DG Foot Complete Right  Result Date: 09/13/2020 CLINICAL DATA:  Erythema, wound to bottom of right foot for 3 weeks EXAM: RIGHT FOOT COMPLETE - 3+ VIEW COMPARISON:  None. FINDINGS: Frontal, oblique, lateral views of the right foot are obtained. No fracture, subluxation, or dislocation. Moderate osteoarthritis greatest at the midfoot and first interphalangeal joint. Superior and inferior calcaneal spurs. There is diffuse soft tissue edema greatest throughout the midfoot and forefoot. IMPRESSION: 1. No acute or destructive bony lesion. 2. Diffuse soft tissue edema. 3. Osteoarthritis. Electronically Signed   By: Randa Ngo M.D.   On: 09/13/2020 22:07    ____________________________________________   PROCEDURES  Procedure(s) performed:   Procedures  None ____________________________________________   INITIAL IMPRESSION / ASSESSMENT AND PLAN / ED COURSE  Pertinent labs & imaging results that were available during my care of the patient were reviewed by me and considered in my medical decision making (see chart for  details).   Patient with history of prior amputation presents to the ED with redness to the right calf. Question cellulitis vs venous stasis dermatitis. Will cover with abx. Would to the right foot is overall well appearing but high risk. No bony lesions on x-ray. Doubt osteo. Patient has f/u with Podiatry in the coming week. Plan for abx pending that appointment. Discussed wound care and close PCP follow up plan.    ____________________________________________  FINAL CLINICAL IMPRESSION(S) / ED DIAGNOSES  Final diagnoses:  Visit for wound check  Cellulitis of right lower extremity     MEDICATIONS GIVEN DURING THIS VISIT:  Medications  doxycycline (VIBRA-TABS) tablet 100 mg (100 mg Oral Given 09/13/20 2250)     NEW OUTPATIENT MEDICATIONS STARTED DURING THIS VISIT:  Discharge Medication List as of 09/13/2020 10:47 PM    START taking these medications   Details  doxycycline (VIBRAMYCIN) 100 MG capsule Take 1 capsule (100 mg total) by mouth 2 (two) times daily for 7 days., Starting Sun 09/13/2020, Until Sun 09/20/2020, Normal        Note:  This document was prepared using Dragon voice recognition software and may include unintentional dictation errors.  Nanda Quinton, MD, [facility] Emergency Medicine    [place] Basista, Wonda [facility], MD 09/17/20 [phone]

## 2020-09-13 NOTE — Discharge Instructions (Signed)
You have been seen today in the Emergency Department (ED) for cellulitis, a superficial skin infection. Please take your antibiotics as prescribed for their ENTIRE prescribed duration.  Take Tylenol as needed for pain, but only as written on the box.  ° °Please follow up with your doctor or in the ED in 24-48 hours for recheck of your infection if you are not improving.  Call your doctor sooner or return to the ED if you develop worsening signs of infection such as: increased redness, increased pain, pus, fever, or other symptoms that concern you. ° °

## 2020-09-15 ENCOUNTER — Ambulatory Visit: Payer: Medicaid Other | Admitting: Podiatry

## 2020-09-18 ENCOUNTER — Ambulatory Visit (INDEPENDENT_AMBULATORY_CARE_PROVIDER_SITE_OTHER): Payer: Medicaid Other | Admitting: Podiatry

## 2020-09-18 ENCOUNTER — Other Ambulatory Visit: Payer: Self-pay

## 2020-09-18 DIAGNOSIS — L97511 Non-pressure chronic ulcer of other part of right foot limited to breakdown of skin: Secondary | ICD-10-CM | POA: Diagnosis not present

## 2020-09-18 DIAGNOSIS — R234 Changes in skin texture: Secondary | ICD-10-CM

## 2020-09-18 MED ORDER — SILVER SULFADIAZINE 1 % EX CREA: TOPICAL_CREAM | CUTANEOUS | 0 refills | Status: DC

## 2020-09-18 NOTE — Progress Notes (Signed)
  Subjective:  Patient ID: Eugene Diaz, male    DOB: November 23, 1965,  MRN: [id number]  No chief complaint on file.   54 y.o. male presents with the above complaint. History confirmed with patient. States the left foot is healing well, the right foot has a large fissure under the big toe area that does not cause pain  Objective:  Physical Exam: warm, good capillary refill, no trophic changes or ulcerative lesions, normal DP and PT pulses and decreased sensory exam. Left Foot: BKA amputation site almost fully healed Right Foot: right 1st MPJ large fissures, triangular in shape, without warmth, erythema, signs of infection   No images are attached to the encounter. Assessment:   1. Ulcer of right foot, limited to breakdown of skin ([place])   2. Skin fissure    Plan:  Patient was evaluated and treated and all questions answered.  Fissure Right foot -Gently debrided with 312 blade and tissue nipper -Silvadene and DSD applied -Dressing applied consisting of silvadene and mepilex border dressing. -Wound cleansed and debrided  Procedure: Selective Debridement of Wound Rationale: Removal of devitalized tissue from the wound to promote healing.  Pre-Debridement Wound Measurements: 3x1, 3x1, 2x1 cm  Post-Debridement Wound Measurements: same as pre-debridement. Type of Debridement: sharp selective Tissue Removed: Devitalized soft-tissue Dressing: Dry, sterile, compression dressing. Disposition: Patient tolerated procedure well. Patient to return in 1 week for follow-up.   Return in about 6 weeks (around 10/30/2020) for fissure f/u.

## 2020-11-06 IMAGING — DX DG FOOT COMPLETE 3+V*L*
3 series · 3 of 3 positions shown · non-contrast
Comparison: 11/13/2019

CLINICAL DATA: Infection.  Rule out osteo.

EXAM:
LEFT FOOT - COMPLETE 3+ VIEW

[foot ap]
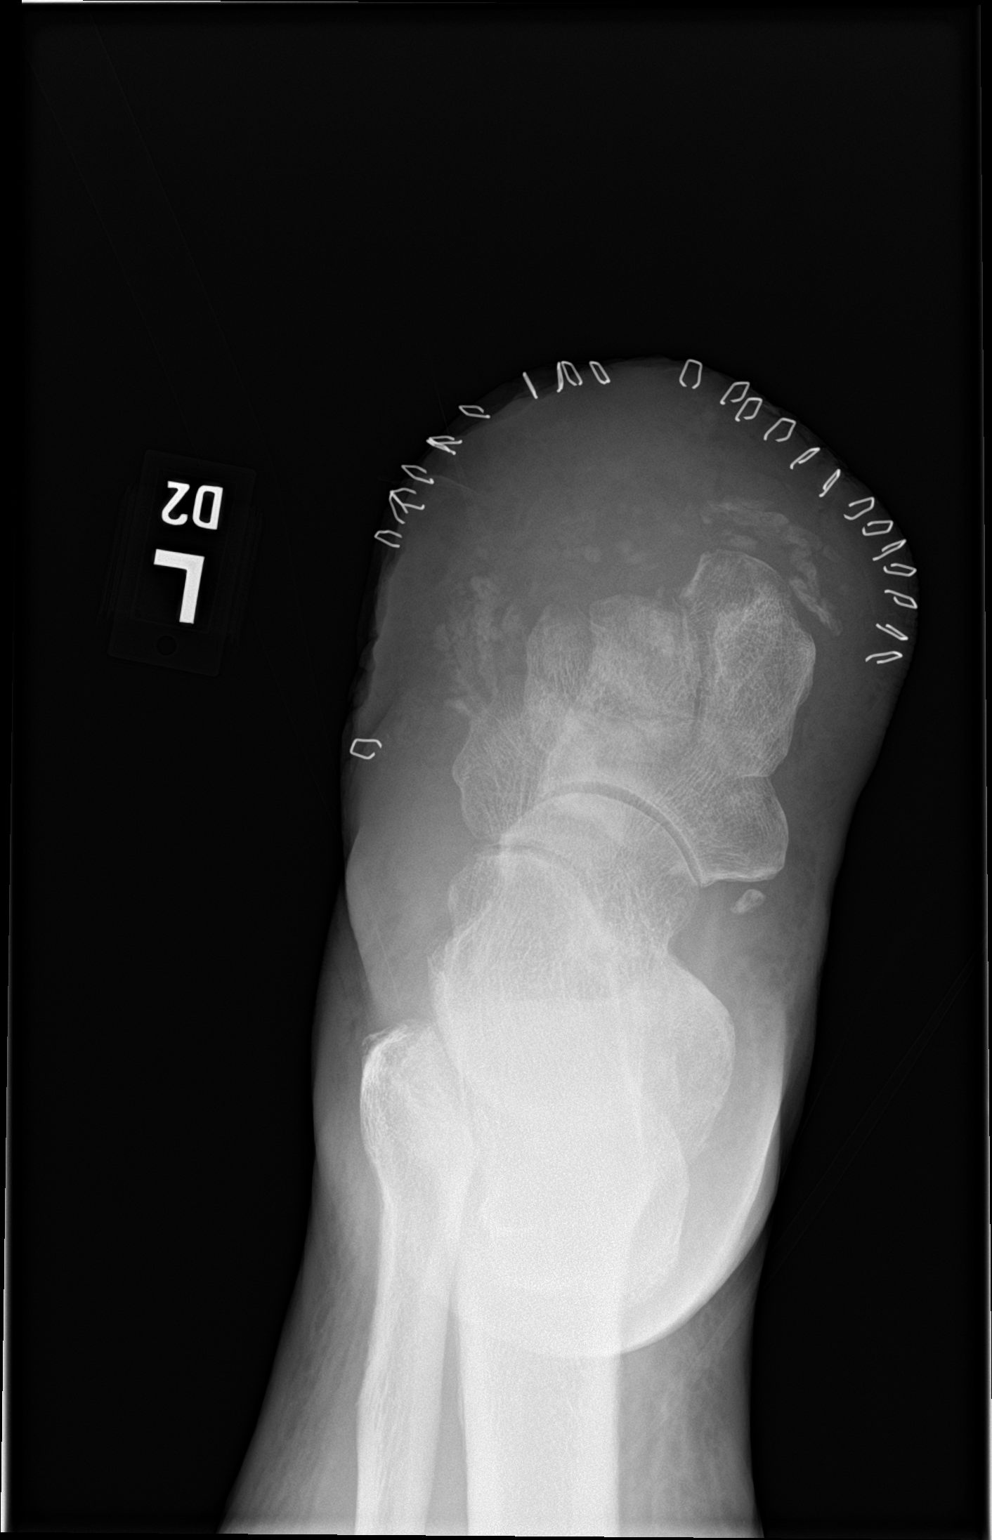

[foot obl]
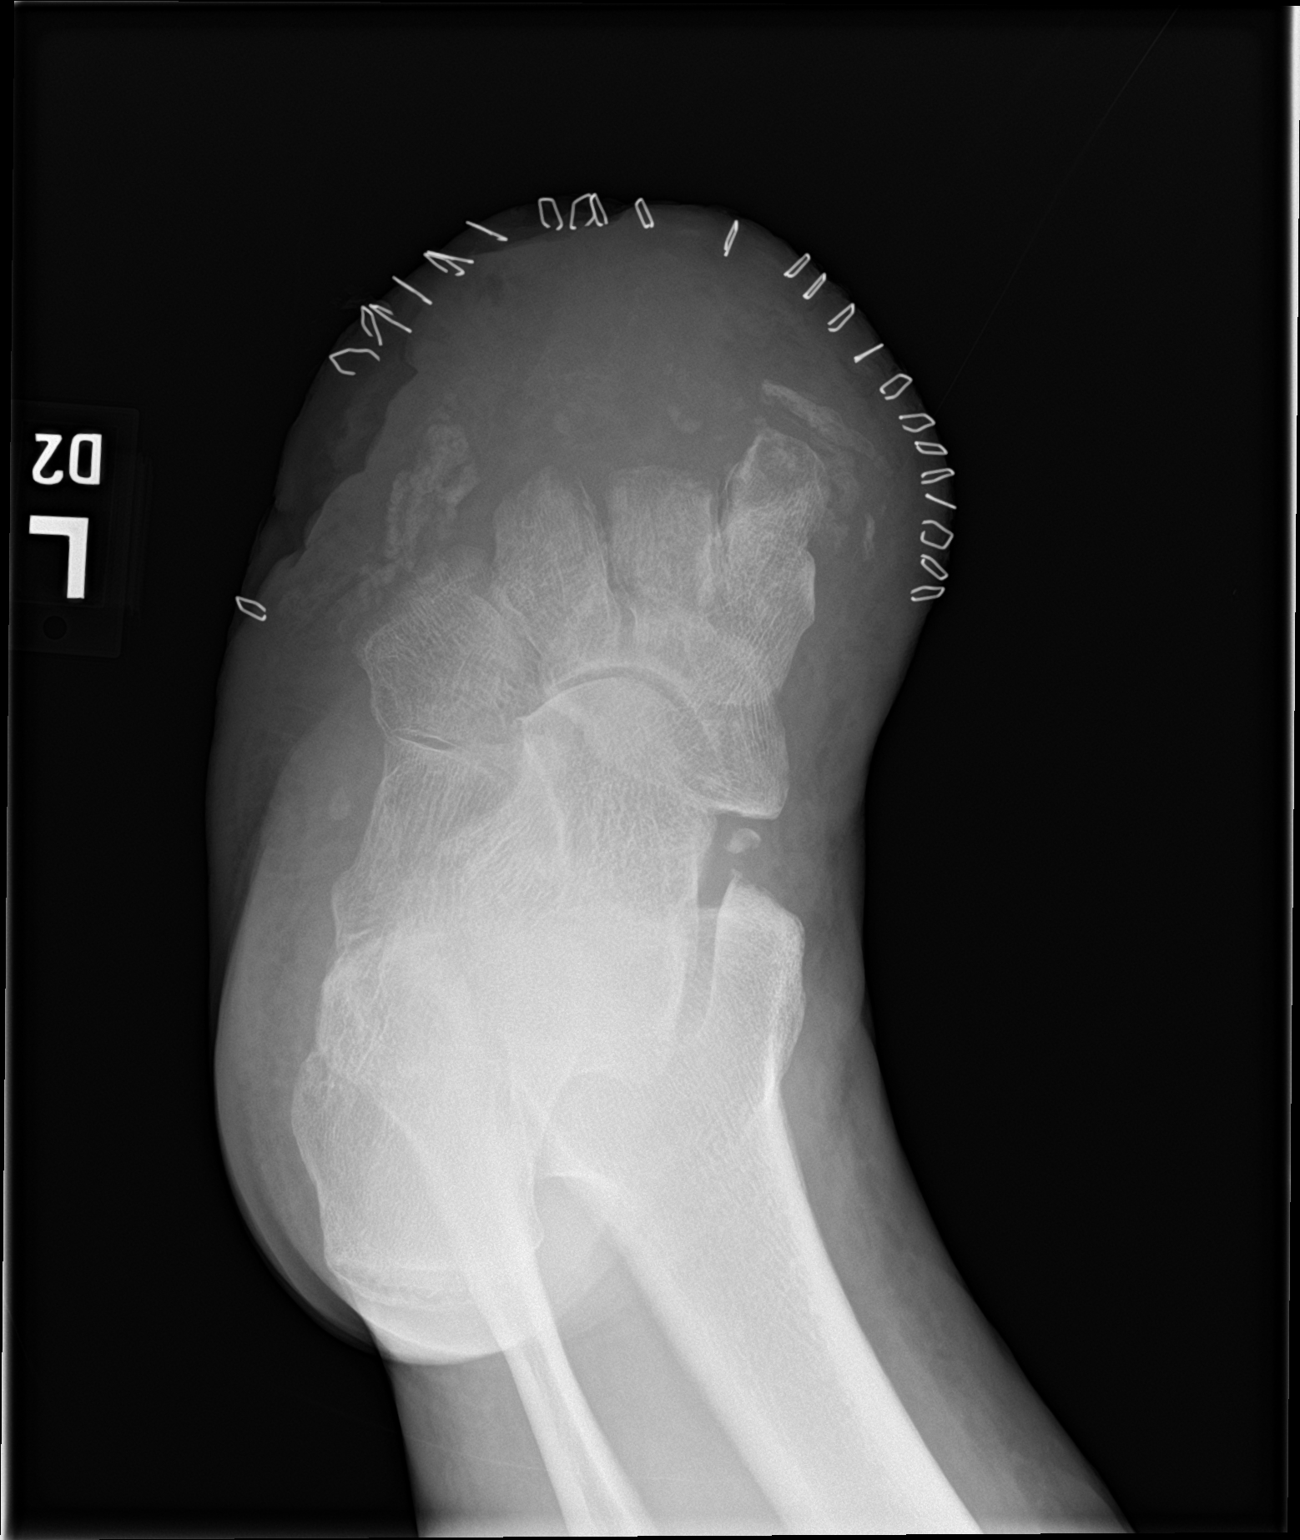

[foot lat]
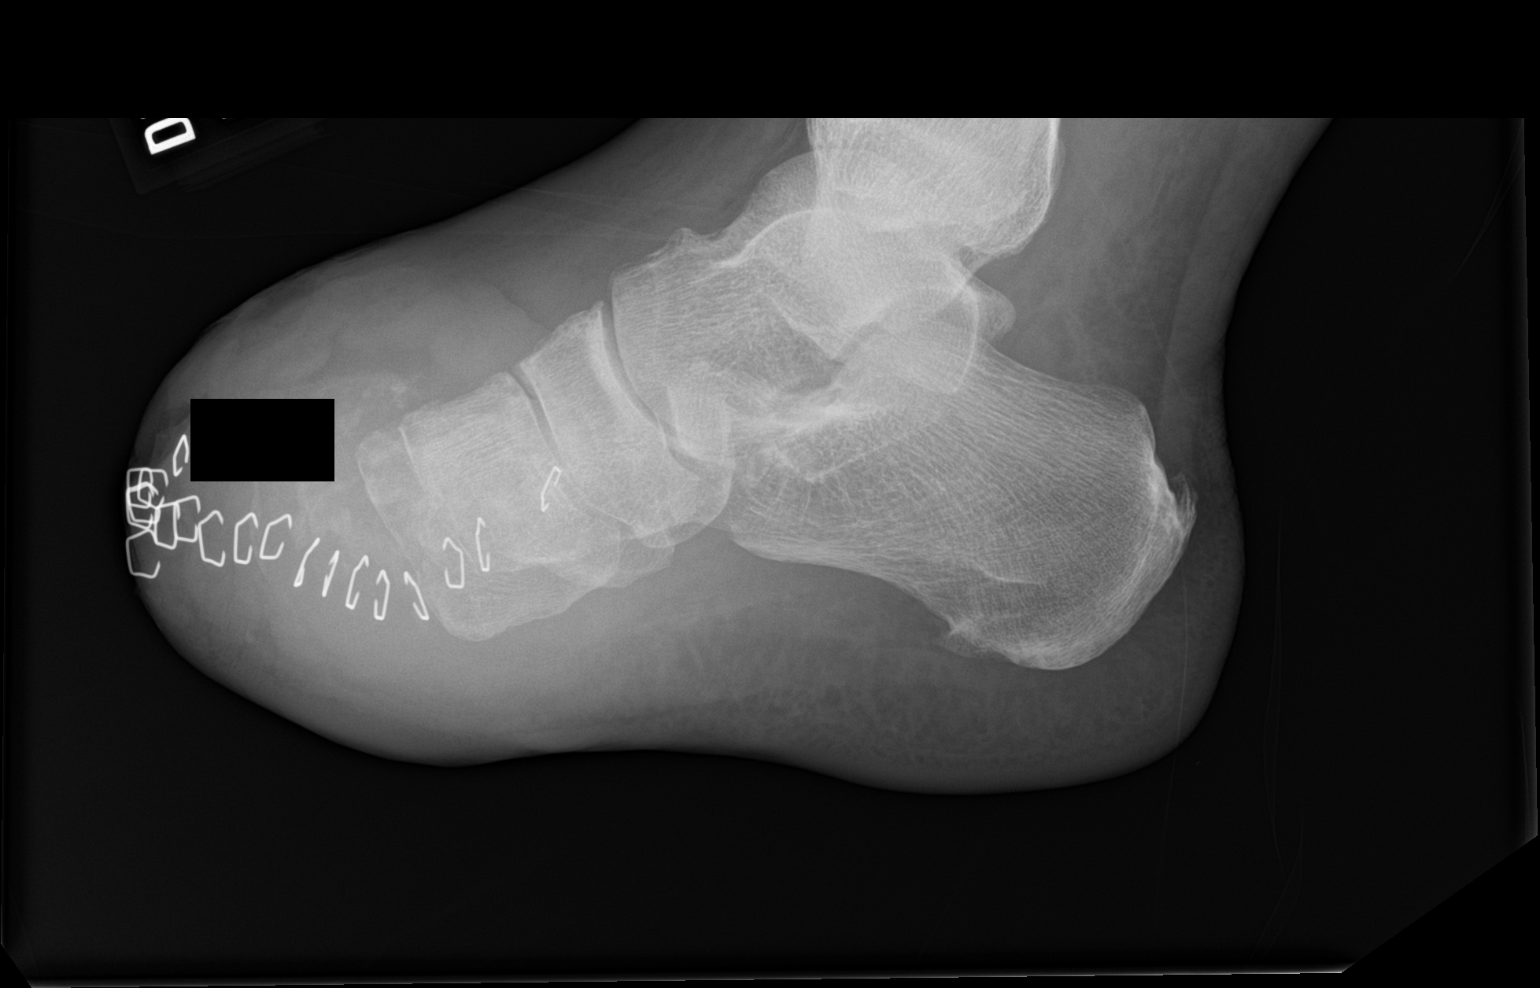

[3 of 3 positions shown; findings below may reference images not displayed]

FINDINGS: Midfoot amputation. Progression of extensive soft tissue swelling.
Cortical erosions are seen in the cuneiform bones suggesting
osteomyelitis. Possible erosive changes in the cuboid bone. Soft
tissue calcification is present which appears dystrophic.
IMPRESSION: Midfoot amputation. Progressive soft tissue swelling and cortical
erosion of the cuneiforms and possibly cuboid bones compatible with
osteomyelitis and soft tissue infection.

## 2020-11-10 ENCOUNTER — Other Ambulatory Visit: Payer: Self-pay

## 2020-11-10 ENCOUNTER — Ambulatory Visit (INDEPENDENT_AMBULATORY_CARE_PROVIDER_SITE_OTHER): Payer: Medicaid Other | Admitting: Podiatry

## 2020-11-10 DIAGNOSIS — R234 Changes in skin texture: Secondary | ICD-10-CM

## 2020-11-10 MED ORDER — SILVER SULFADIAZINE 1 % EX CREA
TOPICAL_CREAM | CUTANEOUS | 0 refills | Status: DC
Start: 2020-11-10 — End: 2021-04-02

## 2020-11-10 NOTE — Progress Notes (Signed)
  Subjective:  Patient ID: Eugene Diaz, male    DOB: 04-27-1966,  MRN: [id number]  Chief Complaint  Patient presents with  . FISSURE OF SKIN    Pt states concern with appearance of callous/fissure. Denies fever/chills/nausea/vomiting. Denies any drainage.    55 y.o. male presents with the above complaint. History confirmed with patient.  Concerned about the appearance of the fissures to the right foot.  Objective:  Physical Exam: warm, good capillary refill, no trophic changes or ulcerative lesions, normal DP and PT pulses and decreased sensory exam. Left Foot: BKA amputation site almost fully healed Right Foot: right 1st MPJ large fissures, with macerated hyperkeratotic tissue without open ulceration  No images are attached to the encounter. Assessment:   1. Skin fissure    Plan:  Patient was evaluated and treated and all questions answered.  Fissure Right foot -Gently debrided fissure no open ulceration noted.  Patient to apply Silvadene to the wound to prevent further hyperkeratosis formation.  He will benefit from diabetic shoes.   No follow-ups on file.

## 2020-12-08 ENCOUNTER — Ambulatory Visit: Payer: Medicaid Other | Admitting: Podiatry

## 2020-12-15 ENCOUNTER — Ambulatory Visit (INDEPENDENT_AMBULATORY_CARE_PROVIDER_SITE_OTHER): Payer: Medicaid Other | Admitting: Physician Assistant

## 2020-12-15 ENCOUNTER — Encounter: Payer: Self-pay | Admitting: Orthopedic Surgery

## 2020-12-15 DIAGNOSIS — Z89512 Acquired absence of left leg below knee: Secondary | ICD-10-CM

## 2020-12-15 NOTE — Progress Notes (Signed)
Office Visit Note   Patient: Eugene Diaz           Date of Birth: 1966/06/29           MRN: [id number] Visit Date: 12/15/2020              Requested by: [name], [place],  [place] [address] PCP: [name], PA-C  Chief Complaint  Patient presents with  . Left Knee - Follow-up      HPI: Patient presents in follow-up today status post left below-knee amputation.  He has no complaints.  He has been seeing [facility] about a month ago at which time they gave him shrinkers.  He has not followed up and is unsure where to go next Assessment & Plan: Visit Diagnoses: No diagnosis found.  Plan: Patient I believe is ready for molding.  I did provide the number for [facility] to him he will follow-up in a month  Follow-Up Instructions: No follow-ups on file.   Ortho Exam  Patient is alert, oriented, no adenopathy, well-dressed, normal affect, normal respiratory effort. Well-healed surgical incision.  No cellulitis no drainage no eschar swelling is very well controlled he is wearing several layers of shrinkers  Imaging: No results found. No images are attached to the encounter.  Labs: Lab Results  Component Value Date   HGBA1C 6.0 (H) 06/21/2020   HGBA1C 4.7 (L) 01/17/2020   HGBA1C 6.4 (H) 11/10/2019   ESRSEDRATE 120 (H) 06/21/2020   ESRSEDRATE 40 (H) 01/18/2020   ESRSEDRATE 97 (H) 11/10/2019   CRP 20.9 (H) 06/21/2020   CRP 14.1 (H) 01/19/2020   CRP 7.6 (H) 11/10/2019   REPTSTATUS 06/27/2020 FINAL 06/22/2020   GRAMSTAIN NO WBC SEEN NO ORGANISMS SEEN  06/22/2020   CULT  06/22/2020    RARE STAPHYLOCOCCUS AUREUS CRITICAL RESULT CALLED TO, READ BACK BY AND VERIFIED WITH: RN G.ANIRA AT 8921 ON 06/23/20 BY T.SAAD NO ANAEROBES ISOLATED Performed at [facility], [place] [address], [place], [place] [address]    LABORGA STAPHYLOCOCCUS AUREUS 06/22/2020     Lab Results  Component Value Date   ALBUMIN 2.7 (L) 07/01/2020   ALBUMIN  2.0 (L) 06/24/2020   ALBUMIN 1.9 (L) 06/23/2020   PREALBUMIN 14.0 (L) 10/11/2019    Lab Results  Component Value Date   MG 1.9 06/24/2020   MG 1.7 06/23/2020   MG 1.9 06/21/2020   No results found for: [facility]  Lab Results  Component Value Date   PREALBUMIN 14.0 (L) 10/11/2019   CBC EXTENDED Latest Ref Rng & Units 07/06/2020 07/01/2020 06/28/2020  WBC 4.0 - 10.5 K/uL 4.4 5.9 7.5  RBC 4.22 - 5.81 MIL/uL 3.04(L) 3.24(L) 2.99(L)  HGB 13.0 - 17.0 g/dL 8.0(L) 8.4(L) 7.8(L)  HCT 39.0 - 52.0 % 26.1(L) 27.7(L) 25.4(L)  PLT 150 - 400 K/uL 199 288 313  NEUTROABS 1.7 - 7.7 K/uL - 3.6 -  LYMPHSABS 0.7 - 4.0 K/uL - 1.3 -     There is no height or weight on file to calculate BMI.  Orders:  No orders of the defined types were placed in this encounter.  No orders of the defined types were placed in this encounter.    Procedures: No procedures performed  Clinical Data: No additional findings.  ROS:  All other systems negative, except as noted in the HPI. Review of Systems  Objective: Vital Signs: There were no vitals taken for this visit.  Specialty Comments:  No specialty  comments available.  PMFS History: Patient Active Problem List   Diagnosis Date Noted  . Unilateral complete BKA, left, initial encounter ([place]) 06/30/2020  . Severe protein-calorie malnutrition ([place])   . Acute blood loss anemia 01/04/2020  . Asthma   . Depression with anxiety   . Osteomyelitis of ankle or foot, acute, left ([place]) 12/17/2019  . Hyperglycemia 12/17/2019  . Type 2 diabetes mellitus with foot ulcer ([place])   . Anemia, chronic disease 11/10/2019  . Acute osteomyelitis of left foot ([place]) 11/10/2019  . Marijuana use 11/10/2019  . Wound dehiscence 08/31/2019  . Osteomyelitis due to type 2 diabetes mellitus ([place]) 08/14/2019  . Acute osteomyelitis of toe of left foot ([place]) 08/14/2019  . Sepsis ([place]) 08/14/2019  . Hyponatremia 08/14/2019  . Normocytic anemia 08/14/2019  . B12 deficiency  08/14/2019  . Subacute osteomyelitis, left ankle and foot ([place])   . Cellulitis of toe, left   . Diabetic infection of left foot ([place])   . Toe fracture, left 07/13/2019  . Cellulitis 07/12/2019  . GERD (gastroesophageal reflux disease) 12/19/2018  . Hepatic steatosis 06/18/2018  . Gastritis, erosive   . Dysphagia, idiopathic 12/27/2017  . Rectal bleeding 12/27/2017  . Intention tremor 06/03/2016  . Left knee pain 12/04/2015  . Alcoholic peripheral neuropathy ([place]) 10/08/2014  . Diabetic neuropathy ([place]) 10/08/2014  . Chest pain, rule out acute myocardial infarction 03/10/2014  . Thrombocytopenia ([place]) 03/10/2014  . ETOH abuse 03/10/2014  . Chest pain 03/10/2014  . Diabetes mellitus without complication ([place])   . Hypertension associated with type 2 diabetes mellitus ([place])   . Hyperlipidemia associated with type 2 diabetes mellitus ([place])    Past Medical History:  Diagnosis Date  . Alcohol abuse   . Alcoholic peripheral neuropathy ([place])   . Anxiety   . Arthritis   . Asthma    followed by pcp  . B12 deficiency   . Depression   . Diabetic neuropathy ([place])   . Edema of both lower extremities   . Essential tremor    neurologist-- dr patel--- due to alcohol abuse  . GERD (gastroesophageal reflux disease)   . History of cellulitis 2020   left lower leg;   recurrent 03-25-2020  . History of esophageal stricture    s/p  dilatation 02-13-2018  . History of osteomyelitis 2020   left great toe    . Hypercholesterolemia   . Hypertension    followed by pcp  (nuclear stress test 03-11-2014 low risk w/ no ischemia, ef 65%  . Normocytic anemia    followed by pcp   (03-18-2020 had transfusion's 02/ 2021)  . Peripheral vascular disease ([place])   . 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 Thrombocytopenia ([facility])    chronic  . Type 2 diabetes mellitus ([place])    followed by pcp---    (03-18-2020  pt stated does not check blood sugar)    Family History  Problem Relation Age of Onset  . Heart attack Mother        Living, 55  . Cancer Father        Deceased  . Healthy Brother   . Healthy Sister   . Colon cancer Neg  Hx   . Colon polyps Neg Hx     Past Surgical History:  Procedure Laterality Date  . AMPUTATION Left 10/16/2019   Procedure: Left partial second ray resection; placement of antibiotic beads;  Surgeon: Evelina Bucy, DPM;  Location: [facility];  Service: Podiatry;  Laterality: Left;  . AMPUTATION Left 11/13/2019   Procedure: Left Midfoot Amputation - Transmetatarsal vs. Lisfranc; Placement antibiotic beads;  Surgeon: Evelina Bucy, DPM;  Location: [facility];  Service: Podiatry;  Laterality: Left;  . AMPUTATION Left 12/21/2019   Procedure: Chopart Amputation left foot;  Surgeon: Evelina Bucy, DPM;  Location: [facility];  Service: Podiatry;  Laterality: Left;  . AMPUTATION Left 06/24/2020   Procedure: LEFT BELOW KNEE AMPUTATION;  Surgeon: Newt Minion, MD;  Location: [facility];  Service: Orthopedics;  Laterality: Left;  . AMPUTATION TOE Left 08/14/2019   Procedure: AMPUTATION GREAT TOE;  Surgeon: Evelina Bucy, DPM;  Location: [facility];  Service: Podiatry;  Laterality: Left;  . APPLICATION OF WOUND VAC Left 12/21/2019   Procedure: Application Of Wound Vac;  Surgeon: Evelina Bucy, DPM;  Location: [facility];  Service: Podiatry;  Laterality: Left;  . BIOPSY  02/13/2018   Procedure: BIOPSY;  Surgeon: Danie Binder, MD;  [facility];  Service: Endoscopy;;  transverse colon biopsy, gastric biopsy  . COLONOSCOPY WITH PROPOFOL N/A 02/13/2018   Procedure: COLONOSCOPY WITH PROPOFOL;  Surgeon: Danie Binder, MD;  [facility];  Service: Endoscopy;  Laterality: N/A;  11:15am  .  ESOPHAGOGASTRODUODENOSCOPY (EGD) WITH PROPOFOL N/A 02/13/2018   Procedure: ESOPHAGOGASTRODUODENOSCOPY (EGD) WITH PROPOFOL;  Surgeon: Danie Binder, MD;  [facility];  Service: Endoscopy;  Laterality: N/A;  . I & D EXTREMITY Left 07/16/2019   Procedure: Insicion  AND DEBRIDEMENT EXTREMITY;  Surgeon: Evelina Bucy, DPM;  Location: [facility];  Service: Podiatry;  Laterality: Left;  . I & D EXTREMITY Left 06/22/2020   Procedure: IRRIGATION AND DEBRIDEMENT EXTREMITY, left foot and ankle;  Surgeon: Evelina Bucy, DPM;  Location: [facility];  Service: Podiatry;  Laterality: Left;  . INCISION AND DRAINAGE OF WOUND Left 10/13/2019   Procedure: IRRIGATION AND DEBRIDEMENT WOUND OF LEFT FOOT AND FIRST METATARSAL RESECTION;  Surgeon: Evelina Bucy, DPM;  [facility];  Service: Podiatry;  Laterality: Left;  . IRRIGATION AND DEBRIDEMENT FOOT Left 11/11/2019   Procedure: Left Foot Wound Irrigation and Debridement;  Surgeon: Evelina Bucy, DPM;  [facility];  Service: Podiatry;  Laterality: Left;  . Left arm     Left arm repair (tendon and artery)  . PERCUTANEOUS PINNING  07/16/2019   Procedure: Open Reduction Percutaneous Pinning Extremity;  Surgeon: Evelina Bucy, DPM;  Location: [facility];  Service: Podiatry;;  . PILONIDAL CYST EXCISION N/A 08/08/2014   Procedure: CYST EXCISION PILONIDAL EXTENSIVE;  Surgeon: Jamesetta So, MD;  Location[facility];  Service: General;  Laterality: N/A;  . POLYPECTOMY  02/13/2018   Procedure: POLYPECTOMY;  Surgeon: Danie Binder, MD;  [facility];  Service: Endoscopy;;  transverse colon polyp hs, rectal polyps times 2  . SAVORY DILATION N/A 02/13/2018   Procedure: SAVORY DILATION;  Surgeon: Danie Binder, MD;  [facility];  Service: Endoscopy;  Laterality: N/A;  . WOUND DEBRIDEMENT Left 09/04/2019   Procedure: DEBRIDEMENT WOUND WITH COMPLEX  REPAIR OF DEHISCENCE;  Surgeon: Evelina Bucy, DPM;  Location: [facility];   Service: Podiatry;  Laterality:  Left;  Leave patient on stretcher  . WOUND DEBRIDEMENT Left 10/16/2019   Procedure: Left foot wound debridement and closure;  Surgeon: Evelina Bucy, DPM;  [facility];  Service: Podiatry;  Laterality: Left;  . WOUND DEBRIDEMENT Left 12/18/2019   Procedure: LEFT FOOT DEBRIDEMENT WITH PARTIAL INCISION OF INFECTED BONE;  Surgeon: Evelina Bucy, DPM;  Location: [facility];  Service: Podiatry;  Laterality: Left;  LEFT FOOT DEBRIDEMENT WITH PARTIAL INCISION OF INFECTED BONE   Social History   Occupational History  . 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  Tobacco Use  . Smoking status: Former Smoker    Packs/day: 2.50    Years: 25.00    Pack years: 62.50    Types: Cigarettes    Quit date: 08/05/1984    Years since quitting: 36.3  . Smokeless tobacco: Never Used  Vaping Use  . Vaping Use: Never used  Substance and Sexual Activity  . Alcohol use: Yes    Alcohol/week: 50.0 standard drinks    Types: 50 Standard drinks or equivalent per week    Comment: Drinks 6 beers, liquor 4-6oz (both daily) x 35 years  . Drug use: Not Currently    Types: Marijuana  . Sexual activity: Yes    Birth control/protection: None

## 2020-12-18 ENCOUNTER — Ambulatory Visit (INDEPENDENT_AMBULATORY_CARE_PROVIDER_SITE_OTHER): Payer: Medicaid Other | Admitting: Podiatry

## 2020-12-18 DIAGNOSIS — Z5329 Procedure and treatment not carried out because of patient's decision for other reasons: Secondary | ICD-10-CM

## 2020-12-18 NOTE — Progress Notes (Signed)
No show for appt. 

## 2020-12-21 IMAGING — DX DG FOOT COMPLETE 3+V*L*
3 series · 3 of 3 positions shown · non-contrast
Comparison: 01/17/2020

CLINICAL DATA: Drainage from surgical wound LEFT foot, on
antibiotics since foot surgery earlier this month question
osteomyelitis

EXAM:
LEFT FOOT - COMPLETE 3+ VIEW

[foot ap]
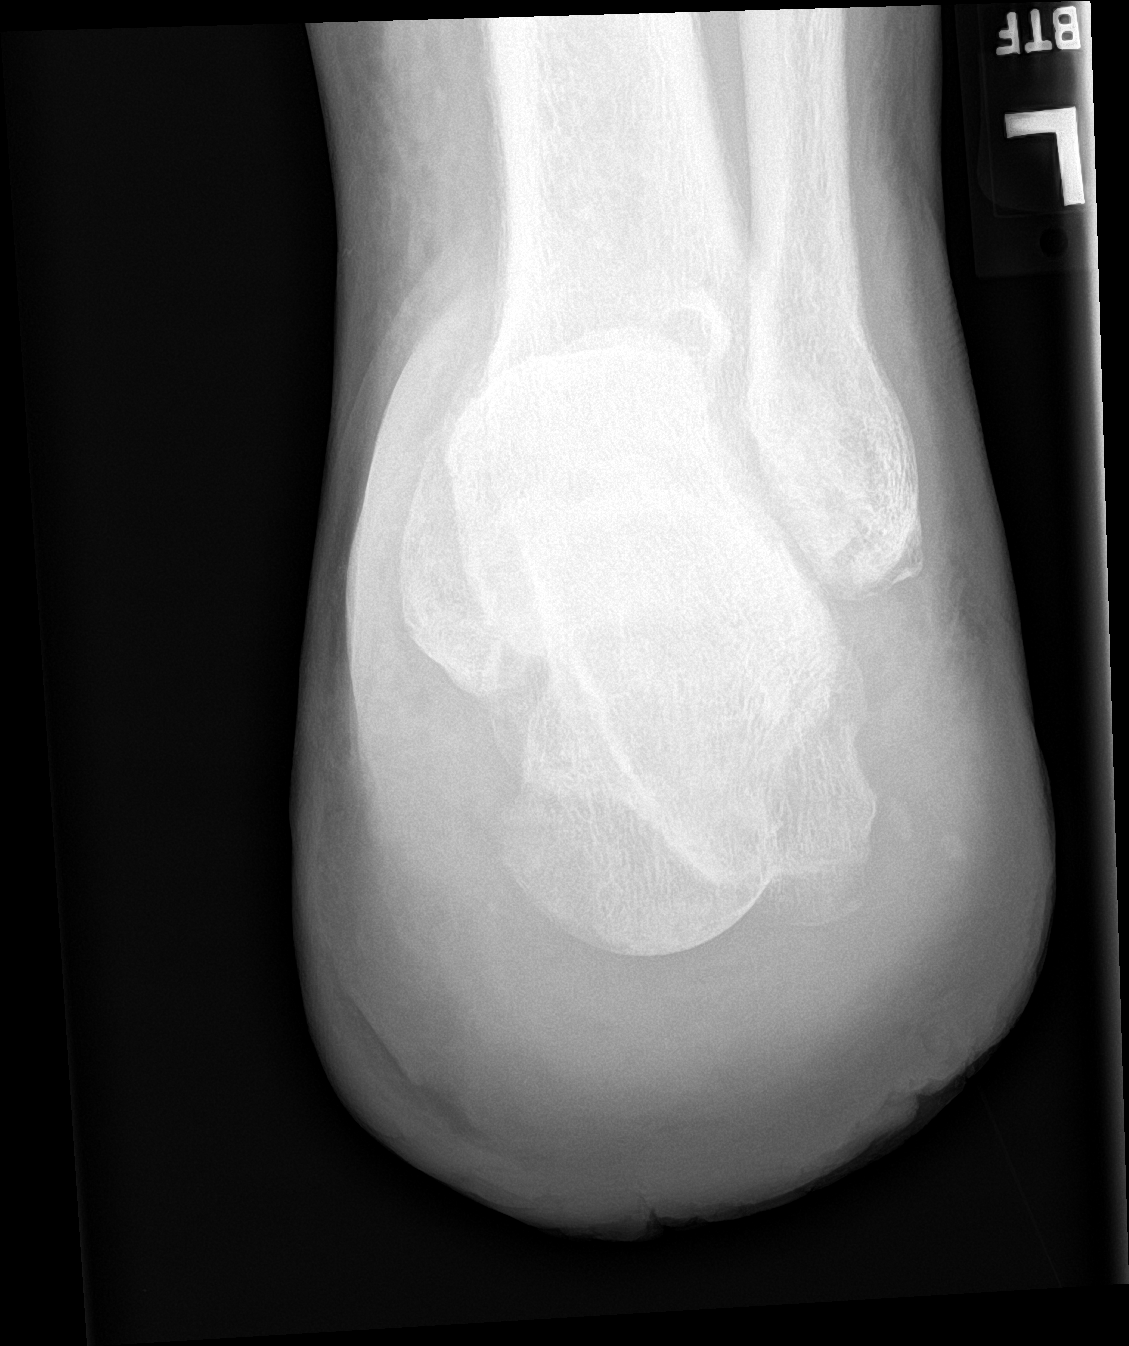

[foot obl]
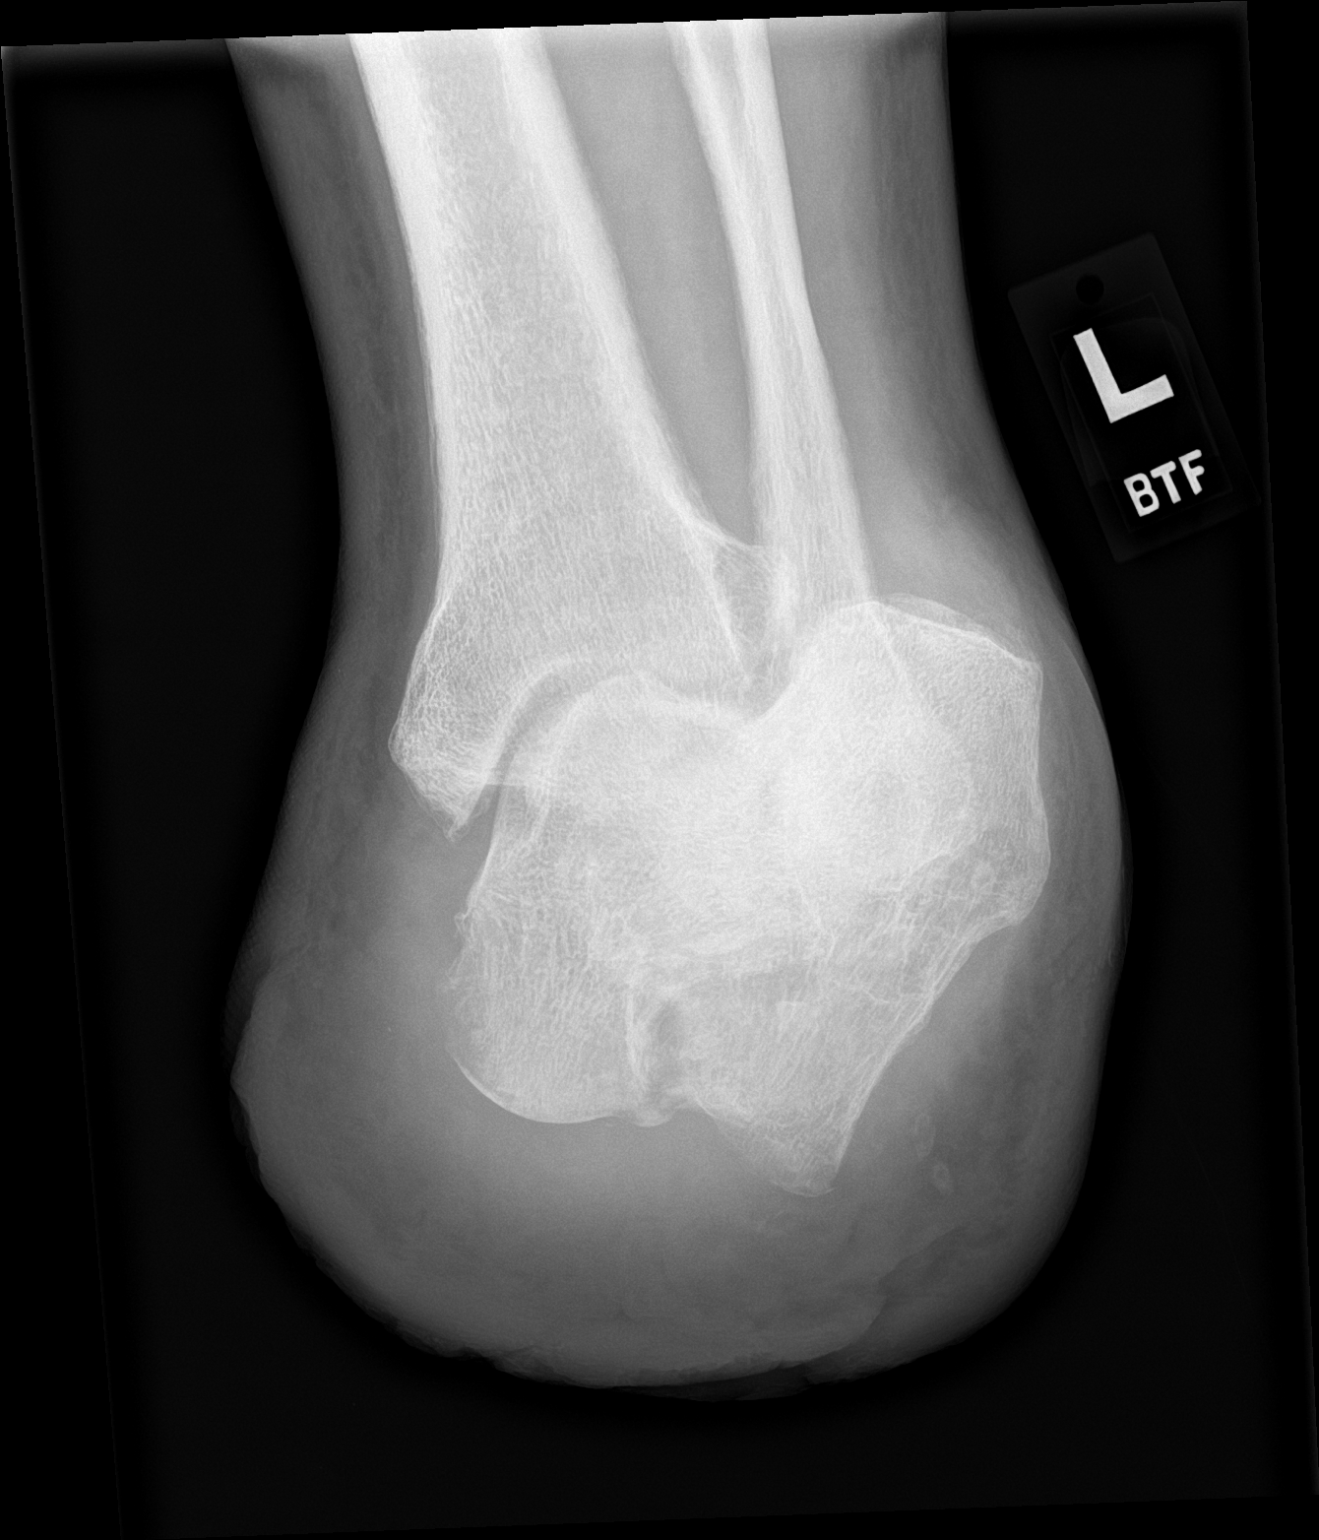

[foot lat]
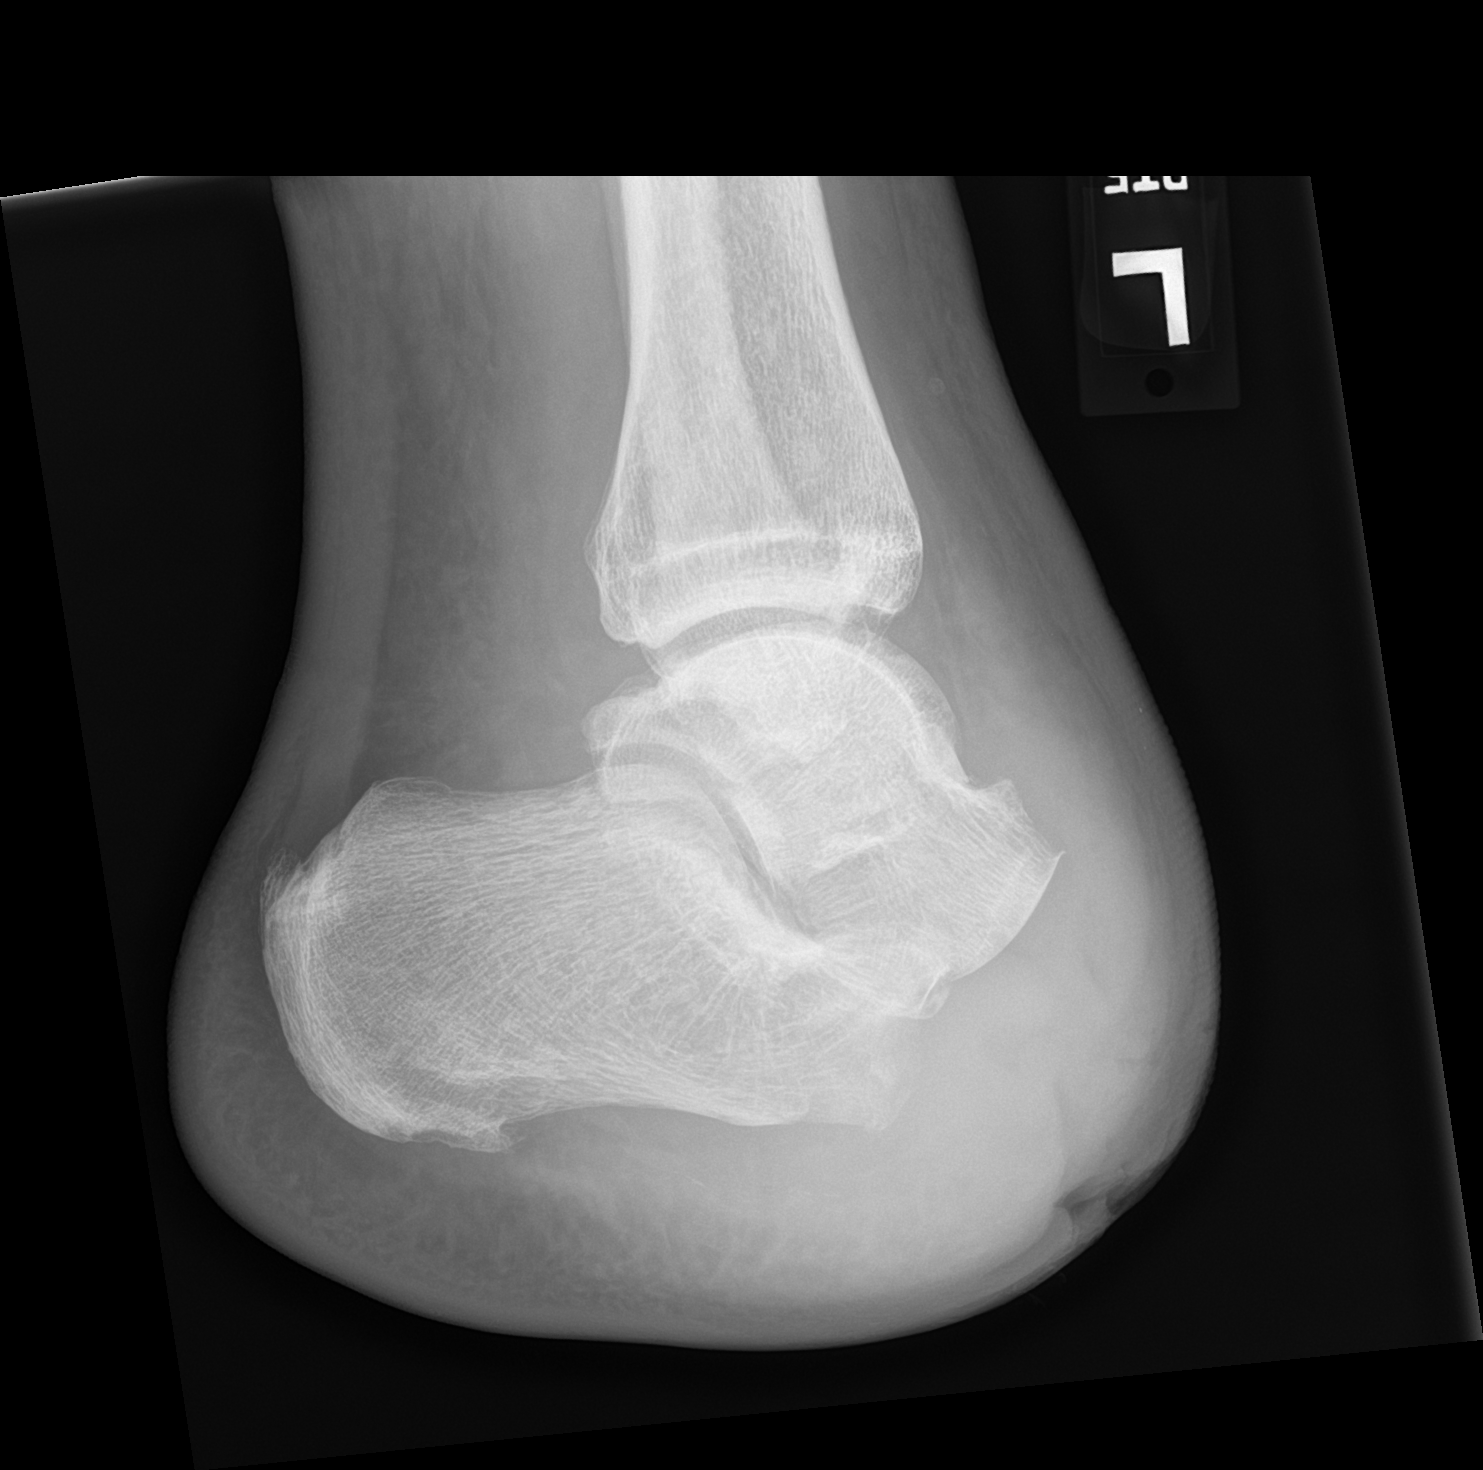

[3 of 3 positions shown; findings below may reference images not displayed]

FINDINGS: Interval removal of skin clips.

Prior midfoot amputation.

Soft tissue swelling at the amputation bed, increased.

Small amount of gas attenuation is seen at a superficial wound at
the amputation bed.

Bone destruction identified at the dorsal margin of the distal
calcaneus concerning for osteomyelitis.

On the lateral view, questionable bone destruction at the distal
calcaneus cannot exclude osteomyelitis at this site as well.

Small plantar and Achilles insertion calcaneal spurs.

No other focal osseous abnormalities.
IMPRESSION: Suspected bone destruction at the distal dorsal talus and at the
distal calcaneus concerning for osteomyelitis; this could be better
evaluated by MR.

## 2021-01-01 ENCOUNTER — Other Ambulatory Visit: Payer: Self-pay

## 2021-01-01 ENCOUNTER — Ambulatory Visit (INDEPENDENT_AMBULATORY_CARE_PROVIDER_SITE_OTHER): Payer: Medicaid Other | Admitting: Podiatry

## 2021-01-01 DIAGNOSIS — Z89512 Acquired absence of left leg below knee: Secondary | ICD-10-CM

## 2021-01-01 DIAGNOSIS — L97512 Non-pressure chronic ulcer of other part of right foot with fat layer exposed: Secondary | ICD-10-CM

## 2021-01-01 NOTE — Progress Notes (Signed)
  Subjective:  Patient ID: Eugene Diaz, male    DOB: [birth year],  MRN: [id number]  Chief Complaint  Patient presents with  . Follow-up    Pt states wound is not healing as he would like- still some discomfort- further evlauation     55 y.o. male presents with the above complaint. History confirmed with patient. Missed his last appointment. States that the wound has worsened and he is not happy with how it is healing. Has his sister come in every other day to help with the dressings.  Objective:  Physical Exam: warm, good capillary refill, normal DP and PT pulses and decreased sensory exam. Left Foot: BKA  Right Foot: Submet 1 ulceration with fully granular base no warmth erythema signs of infection. Measuring 2.5 x 1 Poor pedal hygiene  No images are attached to the encounter. Assessment:   1. Ulcer of right foot with fat layer exposed ([place])    Plan:  Patient was evaluated and treated and all questions answered.  Fissure Right foot -Wound debrided. The wound was significantly dirty with hair and debris in the wound. The wound base is fully granular however and he does not display signs of infection today.  -Surgical shoe attempted to be dispensed. Patient declined, ABN signed. -Attempted to rx silvadene patient states he has enough of it. -He has his sister coming every other day and she helps care for the wound. He is not homebound and does not qualify for complex wound care RN. He does not take care of this well however and I worry this will contribute to worsening.  Procedure: Excisional Debridement of Wound Indication: Removal of non-viable soft tissue from the wound to promote healing.  Anesthesia: none Pre-Debridement Wound Measurements: 2 cm x 1 cm x 0.2 cm  Post-Debridement Wound Measurements: 2.5 cm x 1 cm x 0.2 cm  Type of Debridement: Sharp Excisional Tissue Removed: Non-viable soft tissue Instrumentation: 15 blade and tissue nipper Depth of Debridement:  subcutaneous tissue. Technique: Sharp excisional debridement to bleeding, viable wound base.  Dressing: Dry, sterile, compression dressing. Disposition: Patient tolerated procedure well.    Return in about 3 weeks (around 01/22/2021) for Wound Care.

## 2021-01-12 ENCOUNTER — Ambulatory Visit: Payer: Medicaid Other | Admitting: Orthopedic Surgery

## 2021-01-21 ENCOUNTER — Ambulatory Visit (INDEPENDENT_AMBULATORY_CARE_PROVIDER_SITE_OTHER): Payer: Medicaid Other | Admitting: Physician Assistant

## 2021-01-21 ENCOUNTER — Encounter: Payer: Self-pay | Admitting: Orthopedic Surgery

## 2021-01-21 DIAGNOSIS — Z89512 Acquired absence of left leg below knee: Secondary | ICD-10-CM

## 2021-01-21 NOTE — Progress Notes (Signed)
Office Visit Note   Patient: Eugene Diaz           Date of Birth: 06-22-1966           MRN: [id number] Visit Date: 01/21/2021              Requested by: [name], [place],  [place] [address] PCP: [name], PA-C  Chief Complaint  Patient presents with  . 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  Overall he is doing well.  No complaints.  He has not had any physical therapy yet.  Assessment & Plan: Visit Diagnoses: No diagnosis found.  Plan: Told the patient to make sure he areas out his stump quite often.  Should use his shrinker and fold over the top of the liner.  We will put in a referral for physical therapy  Follow-Up Instructions: No follow-ups on file.   Ortho Exam  Patient is alert, oriented, no adenopathy, well-dressed, normal affect, normal respiratory effort. Examination left below-knee amputation stump when he removed his sleep he had quite a bit of sweaty drainage.  The end of the stump however looks good intact no ascending cellulitis no signs of infection well-healed surgical incision.  Swelling is well controlled  Imaging: No results found. No images are attached to the encounter.  Labs: Lab Results  Component Value Date   HGBA1C 6.0 (H) 06/21/2020   HGBA1C 4.7 (L) 01/17/2020   HGBA1C 6.4 (H) 11/10/2019   ESRSEDRATE 120 (H) 06/21/2020   ESRSEDRATE 40 (H) 01/18/2020   ESRSEDRATE 97 (H) 11/10/2019   CRP 20.9 (H) 06/21/2020   CRP 14.1 (H) 01/19/2020   CRP 7.6 (H) 11/10/2019   REPTSTATUS 06/27/2020 FINAL 06/22/2020   GRAMSTAIN NO WBC SEEN NO ORGANISMS SEEN  06/22/2020   CULT  06/22/2020    RARE STAPHYLOCOCCUS AUREUS CRITICAL RESULT CALLED TO, READ BACK BY AND VERIFIED WITH: RN G.ANIRA AT 3220 ON 06/23/20 BY T.SAAD NO ANAEROBES ISOLATED Performed at [facility], [place] [address], [place], [place] [address]    LABORGA STAPHYLOCOCCUS AUREUS  06/22/2020     Lab Results  Component Value Date   ALBUMIN 2.7 (L) 07/01/2020   ALBUMIN 2.0 (L) 06/24/2020   ALBUMIN 1.9 (L) 06/23/2020   PREALBUMIN 14.0 (L) 10/11/2019    Lab Results  Component Value Date   MG 1.9 06/24/2020   MG 1.7 06/23/2020   MG 1.9 06/21/2020   No results found for: [facility]  Lab Results  Component Value Date   PREALBUMIN 14.0 (L) 10/11/2019   CBC EXTENDED Latest Ref Rng & Units 07/06/2020 07/01/2020 06/28/2020  WBC 4.0 - 10.5 K/uL 4.4 5.9 7.5  RBC 4.22 - 5.81 MIL/uL 3.04(L) 3.24(L) 2.99(L)  HGB 13.0 - 17.0 g/dL 8.0(L) 8.4(L) 7.8(L)  HCT 39.0 - 52.0 % 26.1(L) 27.7(L) 25.4(L)  PLT 150 - 400 K/uL 199 288 313  NEUTROABS 1.7 - 7.7 K/uL - 3.6 -  LYMPHSABS 0.7 - 4.0 K/uL - 1.3 -     There is no height or weight on file to calculate BMI.  Orders:  No orders of the defined types were placed in this encounter.  No orders of the defined types were placed in this encounter.    Procedures: No procedures performed  Clinical Data: No additional findings.  ROS:  All other systems negative, except as noted in the HPI. Review of Systems  Objective: Vital Signs: There were no vitals taken for this visit.  Specialty Comments:  No specialty comments available.  PMFS History: Patient Active Problem List   Diagnosis Date Noted  . Unilateral complete BKA, left, initial encounter ([place]) 06/30/2020  . Severe protein-calorie malnutrition ([place])   . Acute blood loss anemia 01/04/2020  . Asthma   . Depression with anxiety   . Osteomyelitis of ankle or foot, acute, left ([place]) 12/17/2019  . Hyperglycemia 12/17/2019  . Type 2 diabetes mellitus with foot ulcer ([place])   . Anemia, chronic disease 11/10/2019  . Acute osteomyelitis of left foot ([place]) 11/10/2019  . Marijuana use 11/10/2019  . Wound dehiscence 08/31/2019  . Osteomyelitis due to type 2 diabetes mellitus ([place]) 08/14/2019  . Acute osteomyelitis of toe of left foot ([place]) 08/14/2019  . Sepsis ([place])  08/14/2019  . Hyponatremia 08/14/2019  . Normocytic anemia 08/14/2019  . B12 deficiency 08/14/2019  . Subacute osteomyelitis, left ankle and foot ([place])   . Cellulitis of toe, left   . Diabetic infection of left foot ([place])   . Toe fracture, left 07/13/2019  . Cellulitis 07/12/2019  . GERD (gastroesophageal reflux disease) 12/19/2018  . Hepatic steatosis 06/18/2018  . Gastritis, erosive   . Dysphagia, idiopathic 12/27/2017  . Rectal bleeding 12/27/2017  . Intention tremor 06/03/2016  . Left knee pain 12/04/2015  . Alcoholic peripheral neuropathy ([place]) 10/08/2014  . Diabetic neuropathy ([place]) 10/08/2014  . Chest pain, rule out acute myocardial infarction 03/10/2014  . Thrombocytopenia ([place]) 03/10/2014  . ETOH abuse 03/10/2014  . Chest pain 03/10/2014  . Diabetes mellitus without complication ([place])   . Hypertension associated with type 2 diabetes mellitus ([place])   . Hyperlipidemia associated with type 2 diabetes mellitus ([place])    Past Medical History:  Diagnosis Date  . Alcohol abuse   . Alcoholic peripheral neuropathy ([place])   . Anxiety   . Arthritis   . Asthma    followed by pcp  . B12 deficiency   . Depression   . Diabetic neuropathy ([place])   . Edema of both lower extremities   . Essential tremor    neurologist-- dr patel--- due to alcohol abuse  . GERD (gastroesophageal reflux disease)   . History of cellulitis 2020   left lower leg;   recurrent 03-25-2020  . History of esophageal stricture    s/p  dilatation 02-13-2018  . History of osteomyelitis 2020   left great toe    . Hypercholesterolemia   . Hypertension    followed by pcp  (nuclear stress test 03-11-2014 low risk w/ no ischemia, ef 65%  . Normocytic anemia    followed by pcp   (03-18-2020 had transfusion's 02/ 2021)  . Peripheral vascular disease ([place])   . 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 Thrombocytopenia (HCC)    chronic  . Type 2 diabetes mellitus ([place])    followed by pcp---    (03-18-2020  pt stated does not check blood sugar)    Family History  Problem Relation Age of Onset  . Heart attack Mother        Living, 53  . Cancer Father  Deceased  . Healthy Brother   . Healthy Sister   . Colon cancer Neg Hx   . Colon polyps Neg Hx     Past Surgical History:  Procedure Laterality Date  . AMPUTATION Left 10/16/2019   Procedure: Left partial second ray resection; placement of antibiotic beads;  Surgeon: Evelina Bucy, DPM;  [facility];  Service: Podiatry;  Laterality: Left;  . AMPUTATION Left 11/13/2019   Procedure: Left Midfoot Amputation - Transmetatarsal vs. Lisfranc; Placement antibiotic beads;  Surgeon: Evelina Bucy, DPM;  [facility];  Service: Podiatry;  Laterality: Left;  . AMPUTATION Left 12/21/2019   Procedure: Chopart Amputation left foot;  Surgeon: Evelina Bucy, DPM;  Location: [place];  Service: Podiatry;  Laterality: Left;  . AMPUTATION Left 06/24/2020   Procedure: LEFT BELOW KNEE AMPUTATION;  Surgeon: Newt Minion, MD;  Location: [place];  Service: Orthopedics;  Laterality: Left;  . AMPUTATION TOE Left 08/14/2019   Procedure: AMPUTATION GREAT TOE;  Surgeon: Evelina Bucy, DPM;  [facility];  Service: Podiatry;  Laterality: Left;  . APPLICATION OF WOUND VAC Left 12/21/2019   Procedure: Application Of Wound Vac;  Surgeon: Evelina Bucy, DPM;  Location: [place];  Service: Podiatry;  Laterality: Left;  . BIOPSY  02/13/2018   Procedure: BIOPSY;  Surgeon: Danie Binder, MD;  [facility];  Service: Endoscopy;;  transverse colon biopsy, gastric biopsy  . COLONOSCOPY WITH PROPOFOL N/A 02/13/2018   Procedure: COLONOSCOPY WITH PROPOFOL;  Surgeon: Danie Binder, MD;   [facility];  Service: Endoscopy;  Laterality: N/A;  11:15am  . ESOPHAGOGASTRODUODENOSCOPY (EGD) WITH PROPOFOL N/A 02/13/2018   Procedure: ESOPHAGOGASTRODUODENOSCOPY (EGD) WITH PROPOFOL;  Surgeon: Danie Binder, MD;  [facility];  Service: Endoscopy;  Laterality: N/A;  . I & D EXTREMITY Left 07/16/2019   Procedure: Insicion  AND DEBRIDEMENT EXTREMITY;  Surgeon: Evelina Bucy, DPM;  Location: [place];  Service: Podiatry;  Laterality: Left;  . I & D EXTREMITY Left 06/22/2020   Procedure: IRRIGATION AND DEBRIDEMENT EXTREMITY, left foot and ankle;  Surgeon: Evelina Bucy, DPM;  Location: [place];  Service: Podiatry;  Laterality: Left;  . INCISION AND DRAINAGE OF WOUND Left 10/13/2019   Procedure: IRRIGATION AND DEBRIDEMENT WOUND OF LEFT FOOT AND FIRST METATARSAL RESECTION;  Surgeon: Evelina Bucy, DPM;  [facility];  Service: Podiatry;  Laterality: Left;  . IRRIGATION AND DEBRIDEMENT FOOT Left 11/11/2019   Procedure: Left Foot Wound Irrigation and Debridement;  Surgeon: Evelina Bucy, DPM;  [facility];  Service: Podiatry;  Laterality: Left;  . Left arm     Left arm repair (tendon and artery)  . PERCUTANEOUS PINNING  07/16/2019   Procedure: Open Reduction Percutaneous Pinning Extremity;  Surgeon: Evelina Bucy, DPM;  Location: [place];  Service: Podiatry;;  . PILONIDAL CYST EXCISION N/A 08/08/2014   Procedure: CYST EXCISION PILONIDAL EXTENSIVE;  Surgeon: Jamesetta So, MD;  [facility];  Service: General;  Laterality: N/A;  . POLYPECTOMY  02/13/2018   Procedure: POLYPECTOMY;  Surgeon: Danie Binder, MD;  [facility];  Service: Endoscopy;;  transverse colon polyp hs, rectal polyps times 2  . SAVORY DILATION N/A 02/13/2018   Procedure: SAVORY DILATION;  Surgeon: Danie Binder, MD;  [facility];  Service: Endoscopy;  Laterality: N/A;  . WOUND DEBRIDEMENT Left 09/04/2019   Procedure: DEBRIDEMENT WOUND WITH COMPLEX  REPAIR OF  DEHISCENCE;  Surgeon: Evelina Bucy, DPM;  Location: [facility];  Service: Podiatry;  Laterality: Left;  Leave patient on stretcher  . WOUND DEBRIDEMENT Left 10/16/2019   Procedure: Left foot wound debridement and closure;  Surgeon: Evelina Bucy, DPM;  [facility];  Service: Podiatry;  Laterality: Left;  . WOUND DEBRIDEMENT Left 12/18/2019   Procedure: LEFT FOOT DEBRIDEMENT WITH PARTIAL INCISION OF INFECTED BONE;  Surgeon: Evelina Bucy, DPM;  Location: [place];  Service: Podiatry;  Laterality: Left;  LEFT FOOT DEBRIDEMENT WITH PARTIAL INCISION OF INFECTED BONE   Social History   Occupational History  . 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  Tobacco Use  . Smoking status: Former Smoker    Packs/day: 2.50    Years: 25.00    Pack years: 62.50    Types: Cigarettes    Quit date: 08/05/1984    Years since quitting: 36.4  . Smokeless tobacco: Never Used  Vaping Use  . Vaping Use: Never used  Substance and Sexual Activity  . Alcohol use: Yes    Alcohol/week: 50.0 standard drinks    Types: 50 Standard drinks or equivalent per week    Comment: Drinks 6 beers, liquor 4-6oz (both daily) x 35 years  . Drug use: Not Currently    Types: Marijuana  . Sexual activity: Yes    Birth control/protection: None

## 2021-01-22 ENCOUNTER — Ambulatory Visit: Payer: Medicaid Other | Admitting: Podiatry

## 2021-02-18 ENCOUNTER — Ambulatory Visit (INDEPENDENT_AMBULATORY_CARE_PROVIDER_SITE_OTHER): Payer: Medicaid Other | Admitting: Physician Assistant

## 2021-02-18 ENCOUNTER — Encounter: Payer: Self-pay | Admitting: Orthopedic Surgery

## 2021-02-18 DIAGNOSIS — Z89512 Acquired absence of left leg below knee: Secondary | ICD-10-CM

## 2021-02-18 NOTE — Progress Notes (Signed)
Office Visit Note   Patient: Eugene Diaz           Date of Birth: 05/09/1966           MRN: [id number] Visit Date: 02/18/2021              Requested by: [name], [place],  [place] [address] PCP: [name], PA-C  Chief Complaint  Patient presents with  . Left Knee - Follow-up      HPI: Since today he is status post left below-knee amputation.  Overall he is doing well but has been having some difficulty engaging his prosthetic.  He also has a thickened callus for which he is followed by [name]  Assessment & Plan: Visit Diagnoses: No diagnosis found.  Plan: He will follow-up with [facility] to address the problem with his prosthetic not fitting properly.  Also encouraged him to wear his shrinker to control some of the swelling on the right side I think he would benefit from compression stockings to get rid of some of his swelling.  He we will follow-up in 1 month With regards to the right foot he will continue to follow-up with [name].  He is also currently going to be picking up an antibiotic but he was unsure of the name Follow-Up Instructions: No follow-ups on file.   Ortho Exam  Patient is alert, oriented, no adenopathy, well-dressed, normal affect, normal respiratory effort. Left below-knee amputation stump has some mild to moderate soft tissue swelling but no ascending cellulitis well apposed wound edges he does have an abrasion where he scratched his leg while he was here in the office.  No signs of infection.  Right foot he has a second callus around an ulcer on the right first MTP.  He did ask if I could trim this up a little.  He also has brawny skin changes and venous stasis disease.  He does have a triphasic dorsalis pedis pulse and biphasic posterior tibial pulse.  Imaging: No results found. No images are attached to the encounter.  Labs: Lab Results  Component Value Date   HGBA1C 6.0 (H) 06/21/2020   HGBA1C  4.7 (L) 01/17/2020   HGBA1C 6.4 (H) 11/10/2019   ESRSEDRATE 120 (H) 06/21/2020   ESRSEDRATE 40 (H) 01/18/2020   ESRSEDRATE 97 (H) 11/10/2019   CRP 20.9 (H) 06/21/2020   CRP 14.1 (H) 01/19/2020   CRP 7.6 (H) 11/10/2019   REPTSTATUS 06/27/2020 FINAL 06/22/2020   GRAMSTAIN NO WBC SEEN NO ORGANISMS SEEN  06/22/2020   CULT  06/22/2020    RARE STAPHYLOCOCCUS AUREUS CRITICAL RESULT CALLED TO, READ BACK BY AND VERIFIED WITH: RN G.ANIRA AT 9233 ON 06/23/20 BY T.SAAD NO ANAEROBES ISOLATED Performed at [facility], [place] [address] [place], [place] [address]    LABORGA STAPHYLOCOCCUS AUREUS 06/22/2020     Lab Results  Component Value Date   ALBUMIN 2.7 (L) 07/01/2020   ALBUMIN 2.0 (L) 06/24/2020   ALBUMIN 1.9 (L) 06/23/2020   PREALBUMIN 14.0 (L) 10/11/2019    Lab Results  Component Value Date   MG 1.9 06/24/2020   MG 1.7 06/23/2020   MG 1.9 06/21/2020   No results found for: [facility]  Lab Results  Component Value Date   PREALBUMIN 14.0 (L) 10/11/2019   CBC EXTENDED Latest Ref Rng & Units 07/06/2020 07/01/2020 06/28/2020  WBC 4.0 - 10.5 K/uL 4.4 5.9 7.5  RBC 4.22 - 5.81 MIL/uL  3.04(L) 3.24(L) 2.99(L)  HGB 13.0 - 17.0 g/dL 8.0(L) 8.4(L) 7.8(L)  HCT 39.0 - 52.0 % 26.1(L) 27.7(L) 25.4(L)  PLT 150 - 400 K/uL 199 288 313  NEUTROABS 1.7 - 7.7 K/uL - 3.6 -  LYMPHSABS 0.7 - 4.0 K/uL - 1.3 -     There is no height or weight on file to calculate BMI.  Orders:  No orders of the defined types were placed in this encounter.  No orders of the defined types were placed in this encounter.    Procedures: No procedures performed  Clinical Data: No additional findings.  ROS:  All other systems negative, except as noted in the HPI. Review of Systems  Objective: Vital Signs: There were no vitals taken for this visit.  Specialty Comments:  No specialty comments available.  PMFS History: Patient Active Problem List   Diagnosis Date Noted  . Unilateral complete BKA, left,  initial encounter ([place]) 06/30/2020  . Severe protein-calorie malnutrition ([place])   . Acute blood loss anemia 01/04/2020  . Asthma   . Depression with anxiety   . Osteomyelitis of ankle or foot, acute, left ([place]) 12/17/2019  . Hyperglycemia 12/17/2019  . Type 2 diabetes mellitus with foot ulcer ([place])   . Anemia, chronic disease 11/10/2019  . Acute osteomyelitis of left foot ([place]) 11/10/2019  . Marijuana use 11/10/2019  . Wound dehiscence 08/31/2019  . Osteomyelitis due to type 2 diabetes mellitus ([place]) 08/14/2019  . Acute osteomyelitis of toe of left foot ([place]) 08/14/2019  . Sepsis ([place]) 08/14/2019  . Hyponatremia 08/14/2019  . Normocytic anemia 08/14/2019  . B12 deficiency 08/14/2019  . Subacute osteomyelitis, left ankle and foot ([place])   . Cellulitis of toe, left   . Diabetic infection of left foot ([place])   . Toe fracture, left 07/13/2019  . Cellulitis 07/12/2019  . GERD (gastroesophageal reflux disease) 12/19/2018  . Hepatic steatosis 06/18/2018  . Gastritis, erosive   . Dysphagia, idiopathic 12/27/2017  . Rectal bleeding 12/27/2017  . Intention tremor 06/03/2016  . Left knee pain 12/04/2015  . Alcoholic peripheral neuropathy ([place]) 10/08/2014  . Diabetic neuropathy ([place]) 10/08/2014  . Chest pain, rule out acute myocardial infarction 03/10/2014  . Thrombocytopenia ([place]) 03/10/2014  . ETOH abuse 03/10/2014  . Chest pain 03/10/2014  . Diabetes mellitus without complication ([place])   . Hypertension associated with type 2 diabetes mellitus ([place])   . Hyperlipidemia associated with type 2 diabetes mellitus ([place])    Past Medical History:  Diagnosis Date  . Alcohol abuse   . Alcoholic peripheral neuropathy ([place])   . Anxiety   . Arthritis   . Asthma    followed by pcp  . B12 deficiency   . Depression   . Diabetic neuropathy ([place])   . Edema of both lower extremities   . Essential tremor    neurologist-- dr patel--- due to alcohol abuse  . GERD (gastroesophageal reflux disease)    . History of cellulitis 2020   left lower leg;   recurrent 03-25-2020  . History of esophageal stricture    s/p  dilatation 02-13-2018  . History of osteomyelitis 2020   left great toe    . Hypercholesterolemia   . Hypertension    followed by pcp  (nuclear stress test 03-11-2014 low risk w/ no ischemia, ef 65%  . Normocytic anemia    followed by pcp   (03-18-2020 had transfusion's 02/ 2021)  . Peripheral vascular disease ([place])   . 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 Thrombocytopenia (HCC)    chronic  . Type 2 diabetes mellitus ([place])    followed by pcp---    (03-18-2020  pt stated does not check blood sugar)    Family History  Problem Relation Age of Onset  . Heart attack Mother        Living, 10  . Cancer Father        Deceased  . Healthy Brother   . Healthy Sister   . Colon cancer Neg Hx   . Colon polyps Neg Hx     Past Surgical History:  Procedure Laterality Date  . AMPUTATION Left 10/16/2019   Procedure: Left partial second ray resection; placement of antibiotic beads;  Surgeon: Evelina Bucy, DPM;  [facility];  Service: Podiatry;  Laterality: Left;  . AMPUTATION Left 11/13/2019   Procedure: Left Midfoot Amputation - Transmetatarsal vs. Lisfranc; Placement antibiotic beads;  Surgeon: Evelina Bucy, DPM;  [facility];  Service: Podiatry;  Laterality: Left;  . AMPUTATION Left 12/21/2019   Procedure: Chopart Amputation left foot;  Surgeon: Evelina Bucy, DPM;  Location: [place];  Service: Podiatry;  Laterality: Left;  . AMPUTATION Left 06/24/2020   Procedure: LEFT BELOW KNEE AMPUTATION;  Surgeon: Newt Minion, MD;  Location: [place];  Service: Orthopedics;  Laterality: Left;  . AMPUTATION  TOE Left 08/14/2019   Procedure: AMPUTATION GREAT TOE;  Surgeon: Evelina Bucy, DPM;  [facility];  Service: Podiatry;  Laterality: Left;  . APPLICATION OF WOUND VAC Left 12/21/2019   Procedure: Application Of Wound Vac;  Surgeon: Evelina Bucy, DPM;  Location: [place];  Service: Podiatry;  Laterality: Left;  . BIOPSY  02/13/2018   Procedure: BIOPSY;  Surgeon: Danie Binder, MD;  [facility];  Service: Endoscopy;;  transverse colon biopsy, gastric biopsy  . COLONOSCOPY WITH PROPOFOL N/A 02/13/2018   Procedure: COLONOSCOPY WITH PROPOFOL;  Surgeon: Danie Binder, MD;  [facility];  Service: Endoscopy;  Laterality: N/A;  11:15am  . ESOPHAGOGASTRODUODENOSCOPY (EGD) WITH PROPOFOL N/A 02/13/2018   Procedure: ESOPHAGOGASTRODUODENOSCOPY (EGD) WITH PROPOFOL;  Surgeon: Danie Binder, MD;  [facility];  Service: Endoscopy;  Laterality: N/A;  . I & D EXTREMITY Left 07/16/2019   Procedure: Insicion  AND DEBRIDEMENT EXTREMITY;  Surgeon: Evelina Bucy, DPM;  Location: [place];  Service: Podiatry;  Laterality: Left;  . I & D EXTREMITY Left 06/22/2020   Procedure: IRRIGATION AND DEBRIDEMENT EXTREMITY, left foot and ankle;  Surgeon: Evelina Bucy, DPM;  Location: [place];  Service: Podiatry;  Laterality: Left;  . INCISION AND DRAINAGE OF WOUND Left 10/13/2019   Procedure: IRRIGATION AND DEBRIDEMENT WOUND OF LEFT FOOT AND FIRST METATARSAL RESECTION;  Surgeon: Evelina Bucy, DPM;  [facility];  Service: Podiatry;  Laterality: Left;  . IRRIGATION AND DEBRIDEMENT FOOT Left 11/11/2019   Procedure: Left Foot Wound Irrigation and Debridement;  Surgeon: Evelina Bucy, DPM;  [facility];  Service: Podiatry;  Laterality: Left;  . Left arm     Left arm repair (tendon and artery)  . PERCUTANEOUS PINNING  07/16/2019   Procedure: Open Reduction Percutaneous Pinning Extremity;  Surgeon:  Evelina Bucy, DPM;  Location: [place];  Service: Podiatry;;  . PILONIDAL CYST  EXCISION N/A 08/08/2014   Procedure: CYST EXCISION PILONIDAL EXTENSIVE;  Surgeon: Jamesetta So, MD;  [facility];  Service: General;  Laterality: N/A;  . POLYPECTOMY  02/13/2018   Procedure: POLYPECTOMY;  Surgeon: Danie Binder, MD;  [facility];  Service: Endoscopy;;  transverse colon polyp hs, rectal polyps times 2  . SAVORY DILATION N/A 02/13/2018   Procedure: SAVORY DILATION;  Surgeon: Danie Binder, MD;  [facility];  Service: Endoscopy;  Laterality: N/A;  . WOUND DEBRIDEMENT Left 09/04/2019   Procedure: DEBRIDEMENT WOUND WITH COMPLEX  REPAIR OF DEHISCENCE;  Surgeon: Evelina Bucy, DPM;  Location: [place];  Service: Podiatry;  Laterality: Left;  Leave patient on stretcher  . WOUND DEBRIDEMENT Left 10/16/2019   Procedure: Left foot wound debridement and closure;  Surgeon: Evelina Bucy, DPM;  [facility];  Service: Podiatry;  Laterality: Left;  . WOUND DEBRIDEMENT Left 12/18/2019   Procedure: LEFT FOOT DEBRIDEMENT WITH PARTIAL INCISION OF INFECTED BONE;  Surgeon: Evelina Bucy, DPM;  Location: [place];  Service: Podiatry;  Laterality: Left;  LEFT FOOT DEBRIDEMENT WITH PARTIAL INCISION OF INFECTED BONE   Social History   Occupational History  . 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  Tobacco Use  . Smoking status: Former Smoker    Packs/day: 2.50    Years: 25.00    Pack years: 62.50    Types: Cigarettes    Quit date: 08/05/1984    Years since quitting: 36.5  . Smokeless tobacco: Never Used  Vaping Use  . Vaping Use: Never used  Substance and Sexual Activity  . Alcohol use: Yes    Alcohol/week: 50.0 standard drinks    Types: 50 Standard drinks or equivalent per week    Comment: Drinks 6 beers, liquor 4-6oz (both daily) x 35 years  . Drug use: Not Currently    Types: Marijuana  . Sexual activity: Yes    Birth control/protection: None

## 2021-02-23 ENCOUNTER — Ambulatory Visit: Payer: Medicaid Other | Admitting: Podiatry

## 2021-02-24 ENCOUNTER — Encounter (HOSPITAL_COMMUNITY): Payer: Self-pay | Admitting: *Deleted

## 2021-02-24 ENCOUNTER — Emergency Department (HOSPITAL_COMMUNITY): Payer: Medicaid Other

## 2021-02-24 ENCOUNTER — Other Ambulatory Visit: Payer: Self-pay

## 2021-02-24 ENCOUNTER — Emergency Department (HOSPITAL_COMMUNITY)
Admission: EM | Admit: 2021-02-24 | Discharge: 2021-02-24 | Disposition: A | Discharge status: Home / Self Care | Payer: Medicaid Other | Attending: Emergency Medicine | Admitting: Emergency Medicine

## 2021-02-24 DIAGNOSIS — E1140 Type 2 diabetes mellitus with diabetic neuropathy, unspecified: Secondary | ICD-10-CM | POA: Diagnosis not present

## 2021-02-24 DIAGNOSIS — E785 Hyperlipidemia, unspecified: Secondary | ICD-10-CM | POA: Insufficient documentation

## 2021-02-24 DIAGNOSIS — E1169 Type 2 diabetes mellitus with other specified complication: Secondary | ICD-10-CM | POA: Insufficient documentation

## 2021-02-24 DIAGNOSIS — Z79899 Other long term (current) drug therapy: Secondary | ICD-10-CM | POA: Insufficient documentation

## 2021-02-24 DIAGNOSIS — E11621 Type 2 diabetes mellitus with foot ulcer: Secondary | ICD-10-CM | POA: Diagnosis not present

## 2021-02-24 DIAGNOSIS — W08XXXA Fall from other furniture, initial encounter: Secondary | ICD-10-CM | POA: Diagnosis not present

## 2021-02-24 DIAGNOSIS — Z7984 Long term (current) use of oral hypoglycemic drugs: Secondary | ICD-10-CM | POA: Insufficient documentation

## 2021-02-24 DIAGNOSIS — J45909 Unspecified asthma, uncomplicated: Secondary | ICD-10-CM | POA: Insufficient documentation

## 2021-02-24 DIAGNOSIS — I10 Essential (primary) hypertension: Secondary | ICD-10-CM | POA: Insufficient documentation

## 2021-02-24 DIAGNOSIS — Z87891 Personal history of nicotine dependence: Secondary | ICD-10-CM | POA: Diagnosis not present

## 2021-02-24 DIAGNOSIS — Z89512 Acquired absence of left leg below knee: Secondary | ICD-10-CM | POA: Insufficient documentation

## 2021-02-24 DIAGNOSIS — L97429 Non-pressure chronic ulcer of left heel and midfoot with unspecified severity: Secondary | ICD-10-CM | POA: Insufficient documentation

## 2021-02-24 DIAGNOSIS — S2232XA Fracture of one rib, left side, initial encounter for closed fracture: Secondary | ICD-10-CM | POA: Diagnosis not present

## 2021-02-24 DIAGNOSIS — S299XXA Unspecified injury of thorax, initial encounter: Secondary | ICD-10-CM | POA: Diagnosis present

## 2021-02-24 NOTE — ED Provider Notes (Signed)
[facility] EMERGENCY DEPARTMENT Provider Note   CSN: [id number] Arrival date & time: 02/24/21  1408     History Chief Complaint  Patient presents with  . Fall    Eugene Diaz is a 55 y.o. male.  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  Patient states that several days ago he was trying to get up off the couch, lost his balance and fell onto one of his crutches.  He has had some pain in the left rib area and some pain on inspiration.  Denies any chest pain, abdominal pain, nausea or vomiting.  No shortness of breath.  He denies hitting his head, no loss of consciousness.  Not on blood thinners. Denies any hemoptysis    Past Medical History:  Diagnosis Date  . Alcohol abuse   . Alcoholic peripheral neuropathy ([place])   . Anxiety   . Arthritis   . Asthma    followed by pcp  . B12 deficiency   . Depression   . Diabetic neuropathy ([place])   . Edema of both lower extremities   . Essential tremor    neurologist-- dr patel--- due to alcohol abuse  . GERD (gastroesophageal reflux disease)   . History of cellulitis 2020   left lower leg;   recurrent 03-25-2020  . History of esophageal stricture    s/p  dilatation 02-13-2018  . History of osteomyelitis 2020   left great toe    . Hypercholesterolemia   . Hypertension    followed by pcp  (nuclear stress test 03-11-2014 low risk w/ no ischemia, ef 65%  . Normocytic anemia    followed by pcp   (03-18-2020 had transfusion's 02/ 2021)  . Peripheral vascular disease ([place])   . 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 Thrombocytopenia  (HCC)    chronic  . Type 2 diabetes mellitus ([place])    followed by pcp---    (03-18-2020  pt stated does not check blood sugar)    Patient Active Problem List   Diagnosis Date Noted  . Unilateral complete BKA, left, initial encounter ([place]) 06/30/2020  . Severe protein-calorie malnutrition ([place])   . Acute blood loss anemia 01/04/2020  . Asthma   . Depression with anxiety   . Osteomyelitis of ankle or foot, acute, left ([place]) 12/17/2019  . Hyperglycemia 12/17/2019  . Type 2 diabetes mellitus with foot ulcer ([place])   . Anemia, chronic disease 11/10/2019  . Acute osteomyelitis of left foot ([place]) 11/10/2019  . Marijuana use 11/10/2019  . Wound dehiscence 08/31/2019  . Osteomyelitis due to type 2 diabetes mellitus ([place]) 08/14/2019  . Acute osteomyelitis of toe of left foot ([place]) 08/14/2019  . Sepsis ([place]) 08/14/2019  . Hyponatremia 08/14/2019  . Normocytic anemia 08/14/2019  . B12 deficiency 08/14/2019  . Subacute osteomyelitis, left ankle and foot ([place])   . Cellulitis of toe, left   . Diabetic infection of left foot ([place])   . Toe fracture, left 07/13/2019  . Cellulitis 07/12/2019  . GERD (gastroesophageal reflux disease) 12/19/2018  . Hepatic steatosis 06/18/2018  .  Gastritis, erosive   . Dysphagia, idiopathic 12/27/2017  . Rectal bleeding 12/27/2017  . Intention tremor 06/03/2016  . Left knee pain 12/04/2015  . Alcoholic peripheral neuropathy ([place]) 10/08/2014  . Diabetic neuropathy ([place]) 10/08/2014  . Chest pain, rule out acute myocardial infarction 03/10/2014  . Thrombocytopenia ([place]) 03/10/2014  . ETOH abuse 03/10/2014  . Chest pain 03/10/2014  . Diabetes mellitus without complication ([place])   . Hypertension associated with type 2 diabetes mellitus ([place])   . Hyperlipidemia associated with type 2 diabetes mellitus [facility])     Past Surgical History:  Procedure Laterality Date  . AMPUTATION Left 10/16/2019   Procedure: Left partial second ray resection; placement of  antibiotic beads;  Surgeon: Evelina Bucy, DPM;  Location: [facility];  Service: Podiatry;  Laterality: Left;  . AMPUTATION Left 11/13/2019   Procedure: Left Midfoot Amputation - Transmetatarsal vs. Lisfranc; Placement antibiotic beads;  Surgeon: Evelina Bucy, DPM;  [facility]: [facility];  Service: Podiatry;  Laterality: Left;  . AMPUTATION Left 12/21/2019   Procedure: Chopart Amputation left foot;  Surgeon: Evelina Bucy, DPM;  Location: [place];  Service: Podiatry;  Laterality: Left;  . AMPUTATION Left 06/24/2020   Procedure: LEFT BELOW KNEE AMPUTATION;  Surgeon: Newt Minion, MD;  Location: [place];  Service: Orthopedics;  Laterality: Left;  . AMPUTATION TOE Left 08/14/2019   Procedure: AMPUTATION GREAT TOE;  Surgeon: Evelina Bucy, DPM;  [facility];  Service: Podiatry;  Laterality: Left;  . APPLICATION OF WOUND VAC Left 12/21/2019   Procedure: Application Of Wound Vac;  Surgeon: Evelina Bucy, DPM;  Location: [place];  Service: Podiatry;  Laterality: Left;  . BIOPSY  02/13/2018   Procedure: BIOPSY;  Surgeon: Danie Binder, MD;  [facility];  Service: Endoscopy;;  transverse colon biopsy, gastric biopsy  . COLONOSCOPY WITH PROPOFOL N/A 02/13/2018   Procedure: COLONOSCOPY WITH PROPOFOL;  Surgeon: Danie Binder, MD;  [facility];  Service: Endoscopy;  Laterality: N/A;  11:15am  . ESOPHAGOGASTRODUODENOSCOPY (EGD) WITH PROPOFOL N/A 02/13/2018   Procedure: ESOPHAGOGASTRODUODENOSCOPY (EGD) WITH PROPOFOL;  Surgeon: Danie Binder, MD;  [facility];  Service: Endoscopy;  Laterality: N/A;  . I & D EXTREMITY Left 07/16/2019   Procedure: Insicion  AND DEBRIDEMENT EXTREMITY;  Surgeon: Evelina Bucy, DPM;  Location: [place];  Service: Podiatry;  Laterality: Left;  . I & D EXTREMITY Left 06/22/2020   Procedure: IRRIGATION AND DEBRIDEMENT EXTREMITY, left foot and ankle;  Surgeon: Evelina Bucy, DPM;  Location: [place];  Service: Podiatry;  Laterality: Left;  .  INCISION AND DRAINAGE OF WOUND Left 10/13/2019   Procedure: IRRIGATION AND DEBRIDEMENT WOUND OF LEFT FOOT AND FIRST METATARSAL RESECTION;  Surgeon: Evelina Bucy, DPM;  [facility];  Service: Podiatry;  Laterality: Left;  . IRRIGATION AND DEBRIDEMENT FOOT Left 11/11/2019   Procedure: Left Foot Wound Irrigation and Debridement;  Surgeon: Evelina Bucy, DPM;  [facility];  Service: Podiatry;  Laterality: Left;  . Left arm     Left arm repair (tendon and artery)  . PERCUTANEOUS PINNING  07/16/2019   Procedure: Open Reduction Percutaneous Pinning Extremity;  Surgeon: Evelina Bucy, DPM;  Location: [place];  Service: Podiatry;;  . PILONIDAL CYST EXCISION N/A 08/08/2014   Procedure: CYST EXCISION PILONIDAL EXTENSIVE;  Surgeon: Jamesetta So, MD;  [facility];  Service: General;  Laterality: N/A;  . POLYPECTOMY  02/13/2018   Procedure: POLYPECTOMY;  Surgeon:  Fields, Marga Melnick, MD;  [facility];  Service: Endoscopy;;  transverse colon polyp hs, rectal polyps times 2  . SAVORY DILATION N/A 02/13/2018   Procedure: SAVORY DILATION;  Surgeon: Danie Binder, MD;  [facility];  Service: Endoscopy;  Laterality: N/A;  . WOUND DEBRIDEMENT Left 09/04/2019   Procedure: DEBRIDEMENT WOUND WITH COMPLEX  REPAIR OF DEHISCENCE;  Surgeon: Evelina Bucy, DPM;  Location: [place];  Service: Podiatry;  Laterality: Left;  Leave patient on stretcher  . WOUND DEBRIDEMENT Left 10/16/2019   Procedure: Left foot wound debridement and closure;  Surgeon: Evelina Bucy, DPM;  [facility];  Service: Podiatry;  Laterality: Left;  . WOUND DEBRIDEMENT Left 12/18/2019   Procedure: LEFT FOOT DEBRIDEMENT WITH PARTIAL INCISION OF INFECTED BONE;  Surgeon: Evelina Bucy, DPM;  Location: [place];  Service: Podiatry;  Laterality: Left;  LEFT FOOT DEBRIDEMENT WITH PARTIAL INCISION OF INFECTED BONE       Family History  Problem Relation Age of Onset  . Heart attack Mother         Living, 23  . Cancer Father        Deceased  . Healthy Brother   . Healthy Sister   . Colon cancer Neg Hx   . Colon polyps Neg Hx     Social History   Tobacco Use  . Smoking status: Former Smoker    Packs/day: 2.50    Years: 25.00    Pack years: 62.50    Types: Cigarettes    Quit date: 08/05/1984    Years since quitting: 36.5  . Smokeless tobacco: Never Used  Vaping Use  . Vaping Use: Never used  Substance Use Topics  . Alcohol use: Yes    Alcohol/week: 50.0 standard drinks    Types: 50 Standard drinks or equivalent per week    Comment: Drinks 6 beers, liquor 4-6oz (both daily) x 35 years  . Drug use: Not Currently    Types: Marijuana    Home Medications Prior to Admission medications   Medication Sig Start Date End Date Taking? Authorizing Provider  acetaminophen (TYLENOL) 325 MG tablet Take 2 tablets (650 mg total) by mouth every 6 (six) hours as needed for mild pain (or Fever >/= 101). 12/27/19   Arrien, [name], MD  albuterol (PROVENTIL HFA;VENTOLIN HFA) 108 (90 Base) MCG/ACT inhaler Inhale 2 puffs into the lungs every 6 (six) hours as needed for wheezing or shortness of breath. 10/31/18   Hayden Rasmussen, MD  ALPRAZolam Duanne Moron) 1 MG tablet Take 0.5 tablets (0.5 mg total) by mouth 4 (four) times daily as needed for anxiety. 01/21/20   Caren Griffins, MD  amitriptyline (ELAVIL) 100 MG tablet Take 100 mg by mouth at bedtime.    [provider]  atorvastatin (LIPITOR) 40 MG tablet Take 40 mg by mouth every evening.  12/17/15   [provider]  cyclobenzaprine (FLEXERIL) 5 MG tablet Take 1 tablet (5 mg total) by mouth 3 (three) times daily as needed for muscle spasms. 07/10/20   Love, Ivan Anchors, PA-C  diclofenac Sodium (VOLTAREN) 1 % GEL Apply 1 application topically daily as needed (pain).    [provider]  docusate sodium (COLACE) 100 MG capsule Take 100 mg by mouth daily as needed for mild constipation.    [provider]   doxycycline (VIBRA-TABS) 100 MG tablet Take 100 mg by mouth 2 (two) times daily. 09/14/20   [provider]  DULoxetine (CYMBALTA) 60 MG capsule Take 60 mg by mouth daily. 11/18/19   [provider]  ferrous sulfate 325 (65 FE) MG tablet Take 1 tablet (325 mg total) by mouth daily. Patient taking differently: Take 325 mg by mouth daily. 11/15/19 11/14/20  Shelly Coss, MD  folic acid (FOLVITE) 1 MG tablet Take 1 tablet (1 mg total) by mouth daily. 07/11/20   Love, Ivan Anchors, PA-C  furosemide (LASIX) 20 MG tablet Take 20 mg by mouth daily as needed. 03/18/20   [provider]  gabapentin (NEURONTIN) 300 MG capsule TAKE 3 CAPSULES BY MOUTH IN THE MORNING, 3 CAPSULES AT NOON, AND 4 CAPSULES AT BEDTIME FOR NERVE PAIN. 07/03/20   Narda Amber K, DO  glimepiride (AMARYL) 4 MG tablet Take 0.5 tablets (2 mg total) by mouth daily. 07/10/20   Love, Ivan Anchors, PA-C  glycopyrrolate (ROBINUL) 1 MG tablet Take 1 mg by mouth 2 (two) times daily.  12/17/15   [provider]  hydrALAZINE (APRESOLINE) 50 MG tablet Take 1 tablet (50 mg total) by mouth every 8 (eight) hours. 07/10/20   Love, Ivan Anchors, PA-C  hydrOXYzine (ATARAX/VISTARIL) 25 MG tablet Take 25 mg by mouth 4 (four) times daily as needed. 05/18/20   [provider]  lisinopril (ZESTRIL) 20 MG tablet Take 20 mg by mouth daily. 07/28/20   [provider]  lisinopril (ZESTRIL) 40 MG tablet Take 1 tablet (40 mg total) by mouth at bedtime. 07/10/20   Love, Ivan Anchors, PA-C  morphine (MS CONTIN) 30 MG 12 hr tablet Take 30 mg by mouth every 12 (twelve) hours.  05/21/20   [provider]  Multiple Vitamin (MULTIVITAMIN WITH MINERALS) TABS tablet Take 1 tablet by mouth daily. 07/11/20   Love, Ivan Anchors, PA-C  omeprazole (PRILOSEC) 20 MG capsule TAKE 1 CAPSULE WITH BREAKFAST AND SUPPER. 07/29/20   Mahala Menghini, PA-C  oxycodone (ROXICODONE) 30 MG immediate release tablet Take 30 mg by mouth 4 (four) times daily as  needed. 05/08/20   [provider]  primidone (MYSOLINE) 50 MG tablet TAKE (1) TABLET BY MOUTH TWICE DAILY. 07/03/20   Patel, Donika K, DO  senna-docusate (SENOKOT-S) 8.6-50 MG tablet Take 2 tablets by mouth at bedtime. 07/10/20   Love, Ivan Anchors, PA-C  silver sulfADIAZINE (SILVADENE) 1 % cream Apply pea-sized amount to skin fissure daily. 11/10/20   Evelina Bucy, DPM  thiamine 100 MG tablet Take 1 tablet (100 mg total) by mouth daily. 07/11/20   Love, Ivan Anchors, PA-C  vitamin B-12 100 MCG tablet Take 1 tablet (100 mcg total) by mouth daily. 07/11/20   Bary Leriche, PA-C    Allergies    Patient has no known allergies.  Review of Systems   Review of Systems  Respiratory: Negative for shortness of breath.   Cardiovascular: Positive for chest pain.  Skin: Negative for color change and wound.    Physical Exam Updated Vital Signs BP (!) 158/87 (BP Location: Left Arm)   Pulse 84   Temp 98.6 F (37 C) (Oral)   Resp 16   SpO2 96%   Physical Exam Vitals and nursing note reviewed.  Constitutional:      General: He is not in acute distress.    Appearance: He is well-developed. He is not ill-appearing, toxic-appearing or diaphoretic.  HENT:     Head: Normocephalic and atraumatic.  Eyes:     Conjunctiva/sclera: Conjunctivae normal.  Cardiovascular:     Rate and Rhythm: Normal rate and  regular rhythm.     Heart sounds: No murmur heard.   Pulmonary:     Effort: Pulmonary effort is normal. No respiratory distress.     Breath sounds: Normal breath sounds.  Abdominal:     Palpations: Abdomen is soft.     Tenderness: There is no abdominal tenderness.  Musculoskeletal:        General: Tenderness present. No deformity or signs of injury.     Cervical back: Neck supple.     Comments: Left lateral chest wall with some tenderness to the rib cage.  No visible deformities, skin tenting, no flail chest.  No visible bruising.  Abdomen is soft and nontender.  Skin:    General: Skin is  warm and dry.  Neurological:     General: No focal deficit present.     Mental Status: He is alert and oriented to person, place, and time.  Psychiatric:        Mood and Affect: Mood normal.        Behavior: Behavior normal.     ED Results / Procedures / Treatments   Labs (all labs ordered are listed, but only abnormal results are displayed) Labs Reviewed - No data to display  EKG None  Radiology DG Ribs Unilateral W/Chest Left  Result Date: 02/24/2021 CLINICAL DATA:  Status post fall tender left anterior mid to lower rib area. EXAM: LEFT RIBS AND CHEST - 3+ VIEW COMPARISON:  June 21, 2020. FINDINGS: Mildly displaced fracture of an anterolateral left lower rib. No visible pneumothorax or pleural effusions. Streaky left basilar opacities. Cardiomediastinal silhouette is within normal limits. IMPRESSION: 1. Mildly displaced fracture of an anterolateral left lower rib. No visible pneumothorax. 2. Streaky left basilar opacities, favor atelectasis. Electronically Signed   By: Margaretha Sheffield MD   On: 02/24/2021 15:13    Procedures Procedures   Medications Ordered in ED Medications - No data to display  ED Course  I have reviewed the triage vital signs and the nursing notes.  Pertinent labs & imaging results that were available during my care of the patient were reviewed by me and considered in my medical decision making (see chart for details).    MDM Rules/Calculators/A&P                          Pt with mildly displaced rib fracture on the left (8th/9th?) on x-ray. Tender to palpation in the area. Patient with good tidal volume, no hemoptysis, no decreased breath sounds and no pneumothorax on x-ray. Patient given definitive rib structures including use of incentive spirometer 10 times per hour to prevent pneumonia; use of pillow when coughing and taking NSAIDs along with pain medications. Pt is already on morphine and oxycodone at home.  Patient also advised to followup with  orthopedics if not improved in one week.  I have also discussed reasons to return immediately to the ER including difficulty breathing, hemoptysis.  Patient expresses understanding and agrees with plan. Case discussed with Dr. Eulis Foster who is agreeable to the above plan and disposition   Final Clinical Impression(s) / ED Diagnoses Final diagnoses:  Closed fracture of one rib of left side, initial encounter    Rx / DC Orders ED Discharge Orders    None       Lyndel Safe 02/24/21 1531    Daleen Bo, MD 02/25/21 1406

## 2021-02-24 NOTE — Discharge Instructions (Addendum)
X-ray today shows a left rib fracture.  Please use the incentive spirometer up to 10 times an hour to prevent pneumonia.  Please follow-up with your primary care doctor or the orthopedic doctor whose contact information I provided.  Please return to the ER if you have worsening shortness of breath, coughing up blood, or any other new or concerning symptoms.  Please take your home pain medicines for pain.

## 2021-02-24 NOTE — ED Triage Notes (Addendum)
Pt fell yesterday while trying to get up, pt with bilateral crutches and states one crutch got caught.  Denies hitting his head with fall.  C/o left rib pain since fall with deep breathing.

## 2021-02-24 NOTE — ED Notes (Signed)
Pt given IS.  Pt is able says he has used int he pass and understands when and how to use it.

## 2021-03-15 ENCOUNTER — Other Ambulatory Visit: Payer: Self-pay | Admitting: Neurology

## 2021-03-15 ENCOUNTER — Other Ambulatory Visit: Payer: Self-pay | Admitting: Physical Medicine and Rehabilitation

## 2021-03-27 ENCOUNTER — Emergency Department (HOSPITAL_COMMUNITY): Payer: Medicaid Other

## 2021-03-27 ENCOUNTER — Inpatient Hospital Stay (HOSPITAL_COMMUNITY)
Admission: EM | Admit: 2021-03-27 | Discharge: 2021-04-02 | DRG: 617 | Disposition: A | Discharge status: Home / Self Care | Payer: Medicaid Other | Attending: Student | Admitting: Student

## 2021-03-27 ENCOUNTER — Other Ambulatory Visit: Payer: Self-pay

## 2021-03-27 ENCOUNTER — Encounter (HOSPITAL_COMMUNITY): Payer: Self-pay | Admitting: *Deleted

## 2021-03-27 DIAGNOSIS — G621 Alcoholic polyneuropathy: Secondary | ICD-10-CM | POA: Diagnosis present

## 2021-03-27 DIAGNOSIS — M86171 Other acute osteomyelitis, right ankle and foot: Secondary | ICD-10-CM | POA: Diagnosis present

## 2021-03-27 DIAGNOSIS — F1010 Alcohol abuse, uncomplicated: Secondary | ICD-10-CM | POA: Diagnosis present

## 2021-03-27 DIAGNOSIS — R7881 Bacteremia: Secondary | ICD-10-CM | POA: Diagnosis not present

## 2021-03-27 DIAGNOSIS — Z87891 Personal history of nicotine dependence: Secondary | ICD-10-CM

## 2021-03-27 DIAGNOSIS — E11621 Type 2 diabetes mellitus with foot ulcer: Secondary | ICD-10-CM | POA: Diagnosis present

## 2021-03-27 DIAGNOSIS — L03115 Cellulitis of right lower limb: Secondary | ICD-10-CM | POA: Diagnosis present

## 2021-03-27 DIAGNOSIS — J45909 Unspecified asthma, uncomplicated: Secondary | ICD-10-CM | POA: Diagnosis present

## 2021-03-27 DIAGNOSIS — E871 Hypo-osmolality and hyponatremia: Secondary | ICD-10-CM | POA: Diagnosis present

## 2021-03-27 DIAGNOSIS — Z89512 Acquired absence of left leg below knee: Secondary | ICD-10-CM

## 2021-03-27 DIAGNOSIS — I10 Essential (primary) hypertension: Secondary | ICD-10-CM | POA: Diagnosis present

## 2021-03-27 DIAGNOSIS — Z9119 Patient's noncompliance with other medical treatment and regimen: Secondary | ICD-10-CM

## 2021-03-27 DIAGNOSIS — F1020 Alcohol dependence, uncomplicated: Secondary | ICD-10-CM | POA: Diagnosis present

## 2021-03-27 DIAGNOSIS — D696 Thrombocytopenia, unspecified: Secondary | ICD-10-CM | POA: Diagnosis present

## 2021-03-27 DIAGNOSIS — E1152 Type 2 diabetes mellitus with diabetic peripheral angiopathy with gangrene: Secondary | ICD-10-CM | POA: Diagnosis present

## 2021-03-27 DIAGNOSIS — Z20822 Contact with and (suspected) exposure to covid-19: Secondary | ICD-10-CM | POA: Diagnosis present

## 2021-03-27 DIAGNOSIS — L02611 Cutaneous abscess of right foot: Secondary | ICD-10-CM | POA: Diagnosis present

## 2021-03-27 DIAGNOSIS — E11628 Type 2 diabetes mellitus with other skin complications: Secondary | ICD-10-CM

## 2021-03-27 DIAGNOSIS — E1142 Type 2 diabetes mellitus with diabetic polyneuropathy: Secondary | ICD-10-CM | POA: Diagnosis present

## 2021-03-27 DIAGNOSIS — L97519 Non-pressure chronic ulcer of other part of right foot with unspecified severity: Secondary | ICD-10-CM | POA: Diagnosis present

## 2021-03-27 DIAGNOSIS — B9620 Unspecified Escherichia coli [E. coli] as the cause of diseases classified elsewhere: Secondary | ICD-10-CM | POA: Diagnosis present

## 2021-03-27 DIAGNOSIS — F32A Depression, unspecified: Secondary | ICD-10-CM | POA: Diagnosis present

## 2021-03-27 DIAGNOSIS — Z6837 Body mass index (BMI) 37.0-37.9, adult: Secondary | ICD-10-CM | POA: Diagnosis not present

## 2021-03-27 DIAGNOSIS — E1165 Type 2 diabetes mellitus with hyperglycemia: Secondary | ICD-10-CM | POA: Diagnosis present

## 2021-03-27 DIAGNOSIS — E1141 Type 2 diabetes mellitus with diabetic mononeuropathy: Secondary | ICD-10-CM | POA: Diagnosis not present

## 2021-03-27 DIAGNOSIS — L97415 Non-pressure chronic ulcer of right heel and midfoot with muscle involvement without evidence of necrosis: Secondary | ICD-10-CM | POA: Diagnosis not present

## 2021-03-27 DIAGNOSIS — M86179 Other acute osteomyelitis, unspecified ankle and foot: Secondary | ICD-10-CM

## 2021-03-27 DIAGNOSIS — Z8619 Personal history of other infectious and parasitic diseases: Secondary | ICD-10-CM

## 2021-03-27 DIAGNOSIS — E785 Hyperlipidemia, unspecified: Secondary | ICD-10-CM | POA: Diagnosis present

## 2021-03-27 DIAGNOSIS — M86671 Other chronic osteomyelitis, right ankle and foot: Secondary | ICD-10-CM | POA: Diagnosis not present

## 2021-03-27 DIAGNOSIS — E1169 Type 2 diabetes mellitus with other specified complication: Secondary | ICD-10-CM | POA: Diagnosis present

## 2021-03-27 DIAGNOSIS — K219 Gastro-esophageal reflux disease without esophagitis: Secondary | ICD-10-CM | POA: Diagnosis present

## 2021-03-27 DIAGNOSIS — Z7984 Long term (current) use of oral hypoglycemic drugs: Secondary | ICD-10-CM

## 2021-03-27 DIAGNOSIS — E7800 Pure hypercholesterolemia, unspecified: Secondary | ICD-10-CM | POA: Diagnosis present

## 2021-03-27 DIAGNOSIS — M869 Osteomyelitis, unspecified: Secondary | ICD-10-CM | POA: Diagnosis present

## 2021-03-27 DIAGNOSIS — B954 Other streptococcus as the cause of diseases classified elsewhere: Secondary | ICD-10-CM | POA: Diagnosis present

## 2021-03-27 DIAGNOSIS — F419 Anxiety disorder, unspecified: Secondary | ICD-10-CM | POA: Diagnosis present

## 2021-03-27 DIAGNOSIS — E538 Deficiency of other specified B group vitamins: Secondary | ICD-10-CM | POA: Diagnosis present

## 2021-03-27 DIAGNOSIS — L089 Local infection of the skin and subcutaneous tissue, unspecified: Secondary | ICD-10-CM

## 2021-03-27 DIAGNOSIS — M86161 Other acute osteomyelitis, right tibia and fibula: Secondary | ICD-10-CM | POA: Diagnosis not present

## 2021-03-27 DIAGNOSIS — E1140 Type 2 diabetes mellitus with diabetic neuropathy, unspecified: Secondary | ICD-10-CM | POA: Diagnosis present

## 2021-03-27 DIAGNOSIS — E119 Type 2 diabetes mellitus without complications: Secondary | ICD-10-CM

## 2021-03-27 DIAGNOSIS — Z9889 Other specified postprocedural states: Secondary | ICD-10-CM

## 2021-03-27 DIAGNOSIS — Z79899 Other long term (current) drug therapy: Secondary | ICD-10-CM

## 2021-03-27 DIAGNOSIS — Z8249 Family history of ischemic heart disease and other diseases of the circulatory system: Secondary | ICD-10-CM

## 2021-03-27 LAB — CBC WITH DIFFERENTIAL/PLATELET
Abs Immature Granulocytes: 0.07 10*3/uL (ref 0.00–0.07)
Basophils Absolute: 0 10*3/uL (ref 0.0–0.1)
Basophils Relative: 1 %
Eosinophils Absolute: 0.1 10*3/uL (ref 0.0–0.5)
Eosinophils Relative: 1 %
HCT: 38.1 % — ABNORMAL LOW (ref 39.0–52.0)
Hemoglobin: 12.4 g/dL — ABNORMAL LOW (ref 13.0–17.0)
Immature Granulocytes: 1 %
Lymphocytes Relative: 11 %
Lymphs Abs: 0.8 10*3/uL (ref 0.7–4.0)
MCH: 28.8 pg (ref 26.0–34.0)
MCHC: 32.5 g/dL (ref 30.0–36.0)
MCV: 88.6 fL (ref 80.0–100.0)
Monocytes Absolute: 0.4 10*3/uL (ref 0.1–1.0)
Monocytes Relative: 6 %
Neutro Abs: 6.2 10*3/uL (ref 1.7–7.7)
Neutrophils Relative %: 80 %
Platelets: 218 10*3/uL (ref 150–400)
RBC: 4.3 MIL/uL (ref 4.22–5.81)
RDW: 13.1 % (ref 11.5–15.5)
WBC: 7.6 10*3/uL (ref 4.0–10.5)
nRBC: 0 % (ref 0.0–0.2)

## 2021-03-27 LAB — C-REACTIVE PROTEIN: CRP: 19.7 mg/dL — ABNORMAL HIGH (ref <1.0)

## 2021-03-27 LAB — SEDIMENTATION RATE: Sed Rate: 108 mm/hr — ABNORMAL HIGH (ref 0–16)

## 2021-03-27 LAB — LACTIC ACID, PLASMA
Lactic Acid, Venous: 1.5 mmol/L (ref 0.5–1.9)
Lactic Acid, Venous: 0.8 mmol/L (ref 0.5–1.9)

## 2021-03-27 LAB — COMPREHENSIVE METABOLIC PANEL
ALT: 29 U/L (ref 0–44)
AST: 67 U/L — ABNORMAL HIGH (ref 15–41)
Albumin: 3.1 g/dL — ABNORMAL LOW (ref 3.5–5.0)
Alkaline Phosphatase: 93 U/L (ref 38–126)
Anion gap: 11 (ref 5–15)
BUN: 17 mg/dL (ref 6–20)
CO2: 24 mmol/L (ref 22–32)
Calcium: 8.7 mg/dL — ABNORMAL LOW (ref 8.9–10.3)
Chloride: 96 mmol/L — ABNORMAL LOW (ref 98–111)
Creatinine, Ser: 1.08 mg/dL (ref 0.61–1.24)
GFR, Estimated: >60 mL/min (ref >60)
Glucose, Bld: 176 mg/dL — ABNORMAL HIGH (ref 70–99)
Potassium: 3.6 mmol/L (ref 3.5–5.1)
Sodium: 131 mmol/L — ABNORMAL LOW (ref 135–145)
Total Bilirubin: 0.7 mg/dL (ref 0.3–1.2)
Total Protein: 8.6 g/dL — ABNORMAL HIGH (ref 6.5–8.1)

## 2021-03-27 LAB — RESP PANEL BY RT-PCR (FLU A&B, COVID) ARPGX2
Influenza A by PCR: NEGATIVE
Influenza B by PCR: NEGATIVE
SARS Coronavirus 2 by RT PCR: NEGATIVE

## 2021-03-27 LAB — PROTIME-INR
INR: 1.1 (ref 0.8–1.2)
Prothrombin Time: 14.1 seconds (ref 11.4–15.2)

## 2021-03-27 LAB — CBG MONITORING, ED: Glucose-Capillary: 169 mg/dL — ABNORMAL HIGH (ref 70–99)

## 2021-03-27 LAB — GLUCOSE, CAPILLARY: Glucose-Capillary: 146 mg/dL — ABNORMAL HIGH (ref 70–99)

## 2021-03-27 LAB — APTT: aPTT: 29 seconds (ref 24–36)

## 2021-03-27 MED ORDER — INSULIN ASPART 100 UNIT/ML IJ SOLN
0.0000 [IU] | Freq: Three times a day (TID) | INTRAMUSCULAR | Status: DC
  Administered 2021-03-28: 2 [IU] via SUBCUTANEOUS
  Administered 2021-03-28: 5 [IU] via SUBCUTANEOUS
  Administered 2021-03-28: 3 [IU] via SUBCUTANEOUS
  Administered 2021-03-29: 2 [IU] via SUBCUTANEOUS
  Administered 2021-03-29: 3 [IU] via SUBCUTANEOUS
  Administered 2021-03-29: 2 [IU] via SUBCUTANEOUS
  Administered 2021-03-30: 5 [IU] via SUBCUTANEOUS
  Administered 2021-03-30: 2 [IU] via SUBCUTANEOUS

## 2021-03-27 MED ORDER — SODIUM CHLORIDE 0.9 % IV SOLN: INTRAVENOUS | Status: AC

## 2021-03-27 MED ORDER — OXYCODONE HCL 5 MG PO TABS
5.0000 mg | ORAL_TABLET | ORAL | Status: DC | PRN
  Administered 2021-03-29 – 2021-03-31 (×4): 5 mg via ORAL
  Filled 2021-03-27 (×5): qty 1

## 2021-03-27 MED ORDER — ONDANSETRON HCL 4 MG/2ML IJ SOLN: 4.0000 mg | Freq: Four times a day (QID) | INTRAMUSCULAR | Status: DC | PRN

## 2021-03-27 MED ORDER — VANCOMYCIN HCL IN DEXTROSE 1-5 GM/200ML-% IV SOLN
1000.0000 mg | Freq: Once | INTRAVENOUS | Status: DC
  Filled 2021-03-27: qty 200

## 2021-03-27 MED ORDER — LISINOPRIL 40 MG PO TABS
40.0000 mg | ORAL_TABLET | Freq: Every day | ORAL | Status: DC
  Administered 2021-03-27 – 2021-04-01 (×6): 40 mg via ORAL
  Filled 2021-03-27 (×5): qty 1
  Filled 2021-03-27: qty 4

## 2021-03-27 MED ORDER — ATORVASTATIN CALCIUM 40 MG PO TABS
40.0000 mg | ORAL_TABLET | Freq: Every evening | ORAL | Status: DC
  Administered 2021-03-28 – 2021-03-31 (×4): 40 mg via ORAL
  Filled 2021-03-27 (×6): qty 1

## 2021-03-27 MED ORDER — THIAMINE HCL 100 MG PO TABS
100.0000 mg | ORAL_TABLET | Freq: Every day | ORAL | Status: DC
  Administered 2021-03-27 – 2021-04-02 (×6): 100 mg via ORAL
  Filled 2021-03-27 (×6): qty 1

## 2021-03-27 MED ORDER — HYDRALAZINE HCL 50 MG PO TABS
50.0000 mg | ORAL_TABLET | Freq: Three times a day (TID) | ORAL | Status: DC
  Administered 2021-03-27 – 2021-03-29 (×5): 50 mg via ORAL
  Filled 2021-03-27 (×2): qty 2
  Filled 2021-03-27 (×2): qty 1
  Filled 2021-03-27: qty 2

## 2021-03-27 MED ORDER — ONDANSETRON HCL 4 MG PO TABS: 4.0000 mg | ORAL_TABLET | Freq: Four times a day (QID) | ORAL | Status: DC | PRN

## 2021-03-27 MED ORDER — LORAZEPAM 2 MG/ML IJ SOLN
1.0000 mg | INTRAMUSCULAR | Status: AC | PRN
Start: 2021-03-27 — End: 2021-03-30

## 2021-03-27 MED ORDER — ACETAMINOPHEN 325 MG PO TABS
650.0000 mg | ORAL_TABLET | Freq: Four times a day (QID) | ORAL | Status: DC | PRN
  Filled 2021-03-27: qty 2

## 2021-03-27 MED ORDER — POLYETHYLENE GLYCOL 3350 17 G PO PACK: 17.0000 g | PACK | Freq: Every day | ORAL | Status: DC | PRN

## 2021-03-27 MED ORDER — THIAMINE HCL 100 MG/ML IJ SOLN: 100.0000 mg | Freq: Every day | INTRAMUSCULAR | Status: DC

## 2021-03-27 MED ORDER — VANCOMYCIN HCL 1250 MG/250ML IV SOLN
1250.0000 mg | Freq: Two times a day (BID) | INTRAVENOUS | Status: DC
  Administered 2021-03-28: 1250 mg via INTRAVENOUS
  Filled 2021-03-27: qty 250

## 2021-03-27 MED ORDER — SODIUM CHLORIDE 0.9 % IV SOLN
2.0000 g | Freq: Once | INTRAVENOUS | Status: AC
  Administered 2021-03-27: 2 g via INTRAVENOUS
  Filled 2021-03-27: qty 20

## 2021-03-27 MED ORDER — INSULIN ASPART 100 UNIT/ML IJ SOLN: 0.0000 [IU] | Freq: Every day | INTRAMUSCULAR | Status: DC

## 2021-03-27 MED ORDER — HEPARIN SODIUM (PORCINE) 5000 UNIT/ML IJ SOLN
5000.0000 [IU] | Freq: Three times a day (TID) | INTRAMUSCULAR | Status: DC
  Administered 2021-03-27 – 2021-04-02 (×13): 5000 [IU] via SUBCUTANEOUS
  Filled 2021-03-27 (×13): qty 1

## 2021-03-27 MED ORDER — ADULT MULTIVITAMIN W/MINERALS CH
1.0000 | ORAL_TABLET | Freq: Every day | ORAL | Status: DC
  Administered 2021-03-27 – 2021-04-02 (×6): 1 via ORAL
  Filled 2021-03-27 (×6): qty 1

## 2021-03-27 MED ORDER — ACETAMINOPHEN 650 MG RE SUPP: 650.0000 mg | Freq: Four times a day (QID) | RECTAL | Status: DC | PRN

## 2021-03-27 MED ORDER — SODIUM CHLORIDE 0.9 % IV SOLN
2.0000 g | INTRAVENOUS | Status: DC
  Administered 2021-03-28: 2 g via INTRAVENOUS
  Filled 2021-03-27: qty 20

## 2021-03-27 MED ORDER — VANCOMYCIN HCL 2000 MG/400ML IV SOLN
2000.0000 mg | Freq: Once | INTRAVENOUS | Status: AC
  Administered 2021-03-27: 2000 mg via INTRAVENOUS
  Filled 2021-03-27: qty 400

## 2021-03-27 MED ORDER — LORAZEPAM 1 MG PO TABS: 1.0000 mg | ORAL_TABLET | ORAL | Status: AC | PRN

## 2021-03-27 MED ORDER — FOLIC ACID 1 MG PO TABS
1.0000 mg | ORAL_TABLET | Freq: Every day | ORAL | Status: DC
  Administered 2021-03-27 – 2021-04-02 (×6): 1 mg via ORAL
  Filled 2021-03-27 (×6): qty 1

## 2021-03-27 NOTE — ED Triage Notes (Signed)
Pt with wound to right foot with drainage.  Right great toe is discolored and swollen. Pt with DM, pt has not checked his blood sugars in a long time.

## 2021-03-27 NOTE — Progress Notes (Signed)
Pharmacy Antibiotic Note  Eugene Diaz is a 55 y.o. male admitted on 03/27/2021 with cellulitis.  Pharmacy has been consulted for Vancomycin dosing. Some concern for osteomyelitis on xray  Plan:  Vancomycin 2000mg  IV loading dose, then 1250mg  IV every 12 hours.  Goal trough 15-20 mcg/mL. With possible osteomeylitis Also on ceftriaxone 2gm IV q24h F/U cxs and clinical progress Monitor V/S, labs and levels as indicated  Height: 6\' 2"  (188 cm) Weight: 131.5 kg (290 lb) IBW/kg (Calculated) : 82.2  Temp (24hrs), Avg:99.2 F (37.3 C), Min:99.2 F (37.3 C), Max:99.2 F (37.3 C)  Recent Labs  Lab 03/27/21 1805 03/27/21 1953  WBC 7.6  --   CREATININE 1.08  --   LATICACIDVEN 1.5 0.8    Estimated Creatinine Clearance: 110.1 mL/min (by C-G formula based on SCr of 1.08 mg/dL).    No Known Allergies  Antimicrobials this admission: Vancomycin 5/28 >>  Ceftriaxone 5/28 >>   Microbiology results:  BC5/28x: pending  MRSA PCR:   Thank you for allowing pharmacy to be a part of this patient's care.  Isac Sarna, BS [place], [place] Clinical Pharmacist Pager [phone] 03/27/2021 9:08 PM

## 2021-03-27 NOTE — ED Notes (Signed)
Report called to [address]

## 2021-03-27 NOTE — ED Provider Notes (Signed)
[facility] EMERGENCY DEPARTMENT Provider Note   CSN: [id number] Arrival date & time: 03/27/21  1555     History Chief Complaint  Patient presents with  . Wound Infection    Eugene Diaz is a 55 y.o. male.  HPI   This patient is a 55 year old male with a history of alcohol abuse and peripheral neuropathy secondary to his alcohol use.  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  He has peripheral vascular disease and states that for the last year he has had some poorly draining ulcerations to the bottom of his right foot, over the last week he has noted increasing swelling and drainage and over the last 2 days has noticed increasing swelling and purple discoloration of his first and second toes of the right foot.  He denies fevers or chills or nausea or vomiting but states he has no sensation in his legs.  His symptoms are progressive, severe, he presents to the hospital today with these complaints  Past Medical History:  Diagnosis Date  . Alcohol abuse   . Alcoholic peripheral neuropathy ([place])   . Anxiety   . Arthritis   . Asthma    followed by pcp  . B12 deficiency   . Depression   . Diabetic neuropathy ([place])   . Edema of both lower extremities   . Essential tremor    neurologist-- dr patel--- due to alcohol abuse  . GERD (gastroesophageal reflux disease)   . History of cellulitis 2020   left lower leg;   recurrent 03-25-2020  . History of esophageal stricture    s/p  dilatation 02-13-2018  . History of osteomyelitis 2020   left great toe    . Hypercholesterolemia   . Hypertension    followed by pcp  (nuclear stress test 03-11-2014 low risk w/ no ischemia, ef 65%  . Normocytic anemia    followed by pcp   (03-18-2020 had transfusion's 02/ 2021)  . Peripheral vascular disease ([place])   . 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 Thrombocytopenia (HCC)    chronic  . Type 2 diabetes mellitus ([place])    followed by pcp---    (03-18-2020  pt stated does not check blood sugar)    Patient Active Problem List   Diagnosis Date Noted  . Unilateral complete BKA, left, initial encounter ([place]) 06/30/2020  . Severe protein-calorie malnutrition ([place])   . Acute blood loss anemia 01/04/2020  . Asthma   . Depression with anxiety   . Osteomyelitis of ankle or foot, acute, left ([place]) 12/17/2019  . Hyperglycemia 12/17/2019  . Type 2 diabetes mellitus with foot ulcer ([place])   . Anemia, chronic disease 11/10/2019  . Acute osteomyelitis of left foot ([place]) 11/10/2019  . Marijuana use 11/10/2019  . Wound dehiscence 08/31/2019  . Osteomyelitis due to type 2 diabetes mellitus ([place]) 08/14/2019  . Acute osteomyelitis of toe of left foot ([place]) 08/14/2019  . Sepsis ([place]) 08/14/2019  . Hyponatremia 08/14/2019  . Normocytic anemia 08/14/2019  . B12 deficiency 08/14/2019  . Subacute osteomyelitis, left ankle and foot ([place])   . Cellulitis of toe,  left   . Diabetic infection of left foot ([place])   . Toe fracture, left 07/13/2019  . Cellulitis 07/12/2019  . GERD (gastroesophageal reflux disease) 12/19/2018  . Hepatic steatosis 06/18/2018  . Gastritis, erosive   . Dysphagia, idiopathic 12/27/2017  . Rectal bleeding 12/27/2017  . Intention tremor 06/03/2016  . Left knee pain 12/04/2015  . Alcoholic peripheral neuropathy ([place]) 10/08/2014  . Diabetic neuropathy ([place]) 10/08/2014  . Chest pain, rule out acute myocardial infarction 03/10/2014  . Thrombocytopenia ([place]) 03/10/2014  . ETOH abuse 03/10/2014  . Chest pain 03/10/2014  . Diabetes mellitus without complication ([place])   . Hypertension associated with type 2 diabetes mellitus ([place])   .  Hyperlipidemia associated with type 2 diabetes mellitus [facility])     Past Surgical History:  Procedure Laterality Date  . AMPUTATION Left 10/16/2019   Procedure: Left partial second ray resection; placement of antibiotic beads;  Surgeon: Evelina Bucy, DPM;  Location: [facility];  Service: Podiatry;  Laterality: Left;  . AMPUTATION Left 11/13/2019   Procedure: Left Midfoot Amputation - Transmetatarsal vs. Lisfranc; Placement antibiotic beads;  Surgeon: Evelina Bucy, DPM;  [facility];  Service: Podiatry;  Laterality: Left;  . AMPUTATION Left 12/21/2019   Procedure: Chopart Amputation left foot;  Surgeon: Evelina Bucy, DPM;  Location: [place];  Service: Podiatry;  Laterality: Left;  . AMPUTATION Left 06/24/2020   Procedure: LEFT BELOW KNEE AMPUTATION;  Surgeon: Newt Minion, MD;  Location: [place];  Service: Orthopedics;  Laterality: Left;  . AMPUTATION TOE Left 08/14/2019   Procedure: AMPUTATION GREAT TOE;  Surgeon: Evelina Bucy, DPM;  [facility];  Service: Podiatry;  Laterality: Left;  . APPLICATION OF WOUND VAC Left 12/21/2019   Procedure: Application Of Wound Vac;  Surgeon: Evelina Bucy, DPM;  Location: [place];  Service: Podiatry;  Laterality: Left;  . BIOPSY  02/13/2018   Procedure: BIOPSY;  Surgeon: Danie Binder, MD;  [facility];  Service: Endoscopy;;  transverse colon biopsy, gastric biopsy  . COLONOSCOPY WITH PROPOFOL N/A 02/13/2018   Procedure: COLONOSCOPY WITH PROPOFOL;  Surgeon: Danie Binder, MD;  [facility];  Service: Endoscopy;  Laterality: N/A;  11:15am  . ESOPHAGOGASTRODUODENOSCOPY (EGD) WITH PROPOFOL N/A 02/13/2018   Procedure: ESOPHAGOGASTRODUODENOSCOPY (EGD) WITH PROPOFOL;  Surgeon: Danie Binder, MD;  [facility];  Service: Endoscopy;  Laterality: N/A;  . I & D EXTREMITY Left 07/16/2019   Procedure: Insicion  AND DEBRIDEMENT EXTREMITY;  Surgeon: Evelina Bucy, DPM;  Location: [place];  Service: Podiatry;  Laterality:  Left;  . I & D EXTREMITY Left 06/22/2020   Procedure: IRRIGATION AND DEBRIDEMENT EXTREMITY, left foot and ankle;  Surgeon: Evelina Bucy, DPM;  Location: [place];  Service: Podiatry;  Laterality: Left;  . INCISION AND DRAINAGE OF WOUND Left 10/13/2019   Procedure: IRRIGATION AND DEBRIDEMENT WOUND OF LEFT FOOT AND FIRST METATARSAL RESECTION;  Surgeon: Evelina Bucy, DPM;  [facility];  Service: Podiatry;  Laterality: Left;  . IRRIGATION AND DEBRIDEMENT FOOT Left 11/11/2019   Procedure: Left Foot Wound Irrigation and Debridement;  Surgeon: Evelina Bucy, DPM;  [facility];  Service: Podiatry;  Laterality: Left;  . Left arm     Left arm repair (tendon and artery)  . PERCUTANEOUS PINNING  07/16/2019   Procedure: Open Reduction Percutaneous Pinning Extremity;  Surgeon: Evelina Bucy, DPM;  Location: [place];  Service: Podiatry;;  . PILONIDAL CYST EXCISION N/A  08/08/2014   Procedure: CYST EXCISION PILONIDAL EXTENSIVE;  Surgeon: Jamesetta So, MD;  [facility];  Service: General;  Laterality: N/A;  . POLYPECTOMY  02/13/2018   Procedure: POLYPECTOMY;  Surgeon: Danie Binder, MD;  [facility];  Service: Endoscopy;;  transverse colon polyp hs, rectal polyps times 2  . SAVORY DILATION N/A 02/13/2018   Procedure: SAVORY DILATION;  Surgeon: Danie Binder, MD;  [facility];  Service: Endoscopy;  Laterality: N/A;  . WOUND DEBRIDEMENT Left 09/04/2019   Procedure: DEBRIDEMENT WOUND WITH COMPLEX  REPAIR OF DEHISCENCE;  Surgeon: Evelina Bucy, DPM;  Location: [place];  Service: Podiatry;  Laterality: Left;  Leave patient on stretcher  . WOUND DEBRIDEMENT Left 10/16/2019   Procedure: Left foot wound debridement and closure;  Surgeon: Evelina Bucy, DPM;  [facility];  Service: Podiatry;  Laterality: Left;  . WOUND DEBRIDEMENT Left 12/18/2019   Procedure: LEFT FOOT DEBRIDEMENT WITH PARTIAL INCISION OF INFECTED BONE;  Surgeon: Evelina Bucy, DPM;  Location: [place];  Service: Podiatry;  Laterality: Left;  LEFT FOOT DEBRIDEMENT WITH PARTIAL INCISION OF INFECTED BONE       Family History  Problem Relation Age of Onset  . Heart attack Mother        Living, 57  . Cancer Father        Deceased  . Healthy Brother   . Healthy Sister   . Colon cancer Neg Hx   . Colon polyps Neg Hx     Social History   Tobacco Use  . Smoking status: Former Smoker    Packs/day: 2.50    Years: 25.00    Pack years: 62.50    Types: Cigarettes    Quit date: 08/05/1984    Years since quitting: 36.6  . Smokeless tobacco: Never Used  Vaping Use  . Vaping Use: Never used  Substance Use Topics  . Alcohol use: Yes    Alcohol/week: 50.0 standard drinks    Types: 50 Standard drinks or equivalent per week    Comment: Drinks 6 beers, liquor 4-6oz (both daily) x 35 years  . Drug use: Not Currently    Types: Marijuana    Home Medications Prior to Admission medications   Medication Sig Start Date End Date Taking? Authorizing Provider  diclofenac Sodium (VOLTAREN) 1 % GEL Apply 1 application topically daily as needed (pain).   Yes [provider]  lidocaine (XYLOCAINE) 5 % ointment Apply topically 2 (two) times daily as needed. 03/16/21  Yes [provider]  acetaminophen (TYLENOL) 325 MG tablet Take 2 tablets (650 mg total) by mouth every 6 (six) hours as needed for mild pain (or Fever >/= 101). 12/27/19   Arrien, [name], MD  albuterol (PROVENTIL HFA;VENTOLIN HFA) 108 (90 Base) MCG/ACT inhaler Inhale 2 puffs into the lungs every 6 (six) hours as needed for wheezing or shortness of breath. Patient not taking: Reported on 03/27/2021 10/31/18   Hayden Rasmussen, MD  ALPRAZolam Duanne Moron) 1 MG tablet Take 0.5 tablets (0.5 mg total) by mouth 4 (four) times daily as needed for anxiety. Patient not taking: Reported on 03/27/2021 01/21/20   Caren Griffins, MD  amitriptyline (ELAVIL) 100 MG tablet Take 100 mg by mouth at  bedtime. Patient not taking: Reported on 03/27/2021    [provider]  atorvastatin (LIPITOR) 40 MG tablet Take 40 mg by mouth every evening.  Patient not taking: Reported on 03/27/2021 12/17/15  [provider]  cyclobenzaprine (FLEXERIL) 5 MG tablet Take 1 tablet (5 mg total) by mouth 3 (three) times daily as needed for muscle spasms. Patient not taking: No sig reported 07/10/20   Love, Ivan Anchors, PA-C  docusate sodium (COLACE) 100 MG capsule Take 100 mg by mouth daily as needed for mild constipation. Patient not taking: Reported on 03/27/2021    [provider]  doxycycline (VIBRA-TABS) 100 MG tablet Take 100 mg by mouth 2 (two) times daily. Patient not taking: No sig reported 09/14/20   [provider]  DULoxetine (CYMBALTA) 60 MG capsule Take 60 mg by mouth daily. Patient not taking: Reported on 03/27/2021 11/18/19   [provider]  ferrous sulfate 325 (65 FE) MG tablet Take 1 tablet (325 mg total) by mouth daily. Patient not taking: Reported on 03/27/2021 11/15/19 11/14/20  Shelly Coss, MD  folic acid (FOLVITE) 1 MG tablet Take 1 tablet (1 mg total) by mouth daily. Patient not taking: No sig reported 07/11/20   Love, Ivan Anchors, PA-C  furosemide (LASIX) 20 MG tablet Take 20 mg by mouth daily as needed. Patient not taking: Reported on 03/27/2021 03/18/20   [provider]  gabapentin (NEURONTIN) 300 MG capsule TAKE 3 CAPSULES BY MOUTH IN THE MORNING, 3 CAPSULES AT NOON, AND 4 CAPSULES AT BEDTIME FOR NERVE PAIN. Patient not taking: No sig reported 07/03/20   Narda Amber K, DO  glimepiride (AMARYL) 4 MG tablet Take 0.5 tablets (2 mg total) by mouth daily. Patient not taking: No sig reported 07/10/20   Love, Ivan Anchors, PA-C  glycopyrrolate (ROBINUL) 1 MG tablet Take 1 mg by mouth 2 (two) times daily.  Patient not taking: Reported on 03/27/2021 12/17/15   [provider]  hydrALAZINE (APRESOLINE) 50 MG tablet Take 1 tablet (50 mg total) by  mouth every 8 (eight) hours. Patient not taking: No sig reported 07/10/20   Love, Ivan Anchors, PA-C  hydrOXYzine (ATARAX/VISTARIL) 25 MG tablet Take 25 mg by mouth 4 (four) times daily as needed. Patient not taking: Reported on 03/27/2021 05/18/20   [provider]  lisinopril (ZESTRIL) 20 MG tablet Take 20 mg by mouth daily. Patient not taking: No sig reported 07/28/20   [provider]  lisinopril (ZESTRIL) 40 MG tablet Take 1 tablet (40 mg total) by mouth at bedtime. Patient not taking: No sig reported 07/10/20   Love, Ivan Anchors, PA-C  morphine (MS CONTIN) 30 MG 12 hr tablet Take 30 mg by mouth every 12 (twelve) hours.  Patient not taking: Reported on 03/27/2021 05/21/20   [provider]  Multiple Vitamin (MULTIVITAMIN WITH MINERALS) TABS tablet Take 1 tablet by mouth daily. Patient not taking: No sig reported 07/11/20   Love, Ivan Anchors, PA-C  omeprazole (PRILOSEC) 20 MG capsule TAKE 1 CAPSULE WITH BREAKFAST AND SUPPER. Patient not taking: No sig reported 07/29/20   Mahala Menghini, PA-C  oxycodone (ROXICODONE) 30 MG immediate release tablet Take 30 mg by mouth 4 (four) times daily as needed. Patient not taking: Reported on 03/27/2021 05/08/20   [provider]  primidone (MYSOLINE) 50 MG tablet TAKE (1) TABLET BY MOUTH TWICE DAILY. Patient not taking: No sig reported 07/03/20   Narda Amber K, DO  senna-docusate (SENOKOT-S) 8.6-50 MG tablet Take 2 tablets by mouth at bedtime. Patient not taking: No sig reported 07/10/20   Love, Ivan Anchors, PA-C  silver sulfADIAZINE (SILVADENE) 1 % cream Apply pea-sized amount to skin fissure daily. Patient not taking: No sig reported  11/10/20   Evelina Bucy, DPM  thiamine 100 MG tablet Take 1 tablet (100 mg total) by mouth daily. Patient not taking: No sig reported 07/11/20   Love, Ivan Anchors, PA-C  vitamin B-12 100 MCG tablet Take 1 tablet (100 mcg total) by mouth daily. Patient not taking: No sig reported 07/11/20   Bary Leriche, PA-C     Allergies    Patient has no known allergies.  Review of Systems   Review of Systems  All other systems reviewed and are negative.   Physical Exam Updated Vital Signs BP (!) 175/85   Pulse 89   Temp 99.2 F (37.3 C) (Oral)   Resp 17   Ht 1.88 m (6\' 2" )   Wt 131.5 kg   SpO2 98%   BMI 37.23 kg/m   Physical Exam Vitals and nursing note reviewed.  Constitutional:      General: He is not in acute distress.    Appearance: He is well-developed.  HENT:     Head: Normocephalic and atraumatic.     Mouth/Throat:     Pharynx: No oropharyngeal exudate.  Eyes:     General: No scleral icterus.       Right eye: No discharge.        Left eye: No discharge.     Conjunctiva/sclera: Conjunctivae normal.     Pupils: Pupils are equal, round, and reactive to light.  Neck:     Thyroid: No thyromegaly.     Vascular: No JVD.  Cardiovascular:     Rate and Rhythm: Normal rate and regular rhythm.     Heart sounds: Normal heart sounds. No murmur heard. No friction rub. No gallop.   Pulmonary:     Effort: Pulmonary effort is normal. No respiratory distress.     Breath sounds: Normal breath sounds. No wheezing or rales.  Abdominal:     General: Bowel sounds are normal. There is no distension.     Palpations: Abdomen is soft. There is no mass.     Tenderness: There is no abdominal tenderness.  Musculoskeletal:        General: Deformity present. No tenderness. Normal range of motion.     Cervical back: Normal range of motion and neck supple.     Right lower leg: Edema present.     Comments: Left lower extremity with good healing stump on the below the knee amputation.  Right lower extremity with poor pulses, capillary refill is normal at the foot however the first second and part of the third toes are purplish, there is drainage from the deep ulceration on the bottom of the right foot, there is purulent material and foul smell.  Lymphadenopathy:     Cervical: No cervical adenopathy.   Skin:    General: Skin is warm and dry.     Findings: No erythema or rash.  Neurological:     General: No focal deficit present.     Mental Status: He is alert. Mental status is at baseline.     Coordination: Coordination normal.  Psychiatric:        Behavior: Behavior normal.     ED Results / Procedures / Treatments   Labs (all labs ordered are listed, but only abnormal results are displayed) Labs Reviewed  COMPREHENSIVE METABOLIC PANEL - Abnormal; Notable for the following components:      Result Value   Sodium 131 (*)    Chloride 96 (*)    Glucose, Bld 176 (*)  Calcium 8.7 (*)    Total Protein 8.6 (*)    Albumin 3.1 (*)    AST 67 (*)    All other components within normal limits  CBC WITH DIFFERENTIAL/PLATELET - Abnormal; Notable for the following components:   Hemoglobin 12.4 (*)    HCT 38.1 (*)    All other components within normal limits  CBG MONITORING, ED - Abnormal; Notable for the following components:   Glucose-Capillary 169 (*)    All other components within normal limits  RESP PANEL BY RT-PCR (FLU A&B, COVID) ARPGX2  CULTURE, BLOOD (ROUTINE X 2)  CULTURE, BLOOD (ROUTINE X 2)  URINE CULTURE  LACTIC ACID, PLASMA  PROTIME-INR  APTT  LACTIC ACID, PLASMA  URINALYSIS, ROUTINE W REFLEX MICROSCOPIC    EKG None  Radiology DG Chest Port 1 View  Result Date: 03/27/2021 CLINICAL DATA:  Known right foot drainage with first toe ulcer and possible sepsis, initial encounter EXAM: PORTABLE CHEST 1 VIEW COMPARISON:  02/24/2021 FINDINGS: The heart size and mediastinal contours are within normal limits. Both lungs are clear. The visualized skeletal structures are unremarkable. IMPRESSION: No active disease. Electronically Signed   By: Inez Catalina M.D.   On: 03/27/2021 17:45   DG Foot Complete Right  Result Date: 03/27/2021 CLINICAL DATA:  First toe ulcer EXAM: RIGHT FOOT COMPLETE - 3+ VIEW COMPARISON:  09/13/2021 FINDINGS: There is a soft tissue ulcer beneath the  head of the first metatarsal. Considerable subcutaneous air is noted consistent with localized infection. No acute fracture is seen. There is a lucent area in the medial aspect of the first metatarsal head which appears slightly larger than that seen on the prior exam. Possibility of osteomyelitis would deserve consideration. MRI would be more sensitive in this regard. No other bony abnormality is noted. IMPRESSION: Soft tissue ulcer with considerable subcutaneous air consistent with localized infection. There is an area of lucency in the medial aspect of the first metatarsal head which appears slightly larger than that seen on the prior exam. Underlying osteomyelitis would be difficult to exclude. MRI with the flow in this regard as clinically necessary. Electronically Signed   By: Inez Catalina M.D.   On: 03/27/2021 17:49    Procedures Procedures   Medications Ordered in ED Medications  0.9 %  sodium chloride infusion ( Intravenous New Bag/Given 03/27/21 1818)  vancomycin (VANCOREADY) IVPB 2000 mg/400 mL (2,000 mg Intravenous New Bag/Given 03/27/21 1905)  cefTRIAXone (ROCEPHIN) 2 g in sodium chloride 0.9 % 100 mL IVPB (2 g Intravenous New Bag/Given 03/27/21 1800)    ED Course  I have reviewed the triage vital signs and the nursing notes.  Pertinent labs & imaging results that were available during my care of the patient were reviewed by me and considered in my medical decision making (see chart for details).    MDM Rules/Calculators/A&P                          This patient is ill-appearing with regards to his foot however he is afebrile and not tachycardic.  He likely has infection and the need for further treatment and amputation of those toes especially if he does not get better with antibiotics.  We will obtain an x-ray of the foot to look for osteomyelitis, start antibiotics, the patient will need to be admitted to the hospital.  He is not currently on diabetic medications that he is aware  of.  On review of the medical record  he has been prescribed glimepiride, he has been prescribed antibiotics over time including doxycycline most recently in November 2021  Possible osteomyelitis - no fever, no leukocytosis D/w Dr. Clearence Ped - who will admit Abx given  Final Clinical Impression(s) / ED Diagnoses Final diagnoses:  Diabetic foot infection ([place])  Osteomyelitis of foot, acute [facility])    Rx / DC Orders ED Discharge Orders    None       Noemi Chapel, MD 03/27/21 1949

## 2021-03-27 NOTE — ED Notes (Signed)
Bulky dressing applied to right foot.

## 2021-03-27 NOTE — H&P (Signed)
TRH H&P    Patient Demographics:    Eugene Diaz, is a 55 y.o. male  MRN: [id number]  DOB - 1966-01-13  Admit Date - 03/27/2021  Referring MD/NP/PA: Sabra Heck  Outpatient Primary MD for the patient is [name], PA-C  Patient coming from: Home  Chief complaint- Toe swelling   HPI:    Eugene Diaz  is a 55 y.o. 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  Patient had a BKA approximately 1 year ago per his report.  Since then he has had an ulcer on the ball of his right foot.  He reports noncompliance with wound care or medications.  Over the last 2 weeks the ulcer gradually became worse, right great toe gradually became more swollen, right great toe turning blue.  Patient reports that over the last 3 days since also had malodorous discharge.  He denies purulence, but plenty of serosanguineous discharge.  He reports no pain as the part of his foot is chronically numb.  He reports that his mother told him he had a fever, but that he did not feel feverish.  Mother is in room and reports that she did not check his temperature.  He has had a decrease in appetite over the last 3 or 4 days.  He reports his last normal meal was probably 4 days ago.  He denies any nausea, vomiting, diarrhea, constipation.  Last normal bowel movement was yesterday.  He denies any change in urine output.  He has no other sores on his body.  Of note, patient reports that he has a history of alcohol abuse.  He reports that he stopped drinking 2 weeks ago.  He did find half a gallon of liquor in his house today, but did not drink it.  He reports no DTs or seizures.  Patient denies taking any  of the medications on his home medication list.  He reports that he has not seen a doctor in a year.  He reports that he is "just not a pill person."  We discussed management of his comorbidities, and risks associated when not managed properly.  Patient is getting around on crutches at home, and is still fairly functional.  We discussed that he will likely need amputation of a portion of his lower extremity during this hospitalization, and that amputations will continue if patient chooses not to manage his medical problems.  We discussed other potential risks associated with diabetes, peripheral vascular disease, and hypertension as well.  Patient reports understanding.  He reports that there is no financial barrier, or other barrier that the hospital can help him with, to taking his medications.  He just needs to decide to do it.  Patient does not smoke, does not use illicit drugs, is not vaccinated for COVID.  Patient is full code.  In the ED Temp 99.2,  heart rate 76-92, respiratory rate 17-22, blood pressure 175/85, satting at 98% on room air No leukocytosis with white blood cell count of 7.6, hemoglobin 12.4 Slight hyponatremia at 131, glucose 176 Albumin decreased at 3.1 Alk phos elevated as expected with ostia AST 67, ALT 29 Lactic acid pending Blood cultures pending Negative respiratory panel Chest x-ray shows no active disease X-ray foot shows localized infection with underlying osteo-, recommend MRI to follow-up    Review of systems:    In addition to the HPI above,  Subjective fever No Headache, No changes with Vision or hearing, No problems swallowing food or Liquids, No Chest pain, Cough or Shortness of Breath, No Abdominal pain, No Nausea or Vomiting, bowel movements are regular, No Blood in stool or Urine, No dysuria, No new joints pains-aches,  No new weakness, tingling, numbness in any extremity, No recent weight gain or loss, No polyuria, polydypsia or polyphagia, No  significant Mental Stressors.  All other systems reviewed and are negative.    Past History of the following :    Past Medical History:  Diagnosis Date  . Alcohol abuse   . Alcoholic peripheral neuropathy ([place])   . Anxiety   . Arthritis   . Asthma    followed by pcp  . B12 deficiency   . Depression   . Diabetic neuropathy ([place])   . Edema of both lower extremities   . Essential tremor    neurologist-- dr patel--- due to alcohol abuse  . GERD (gastroesophageal reflux disease)   . History of cellulitis 2020   left lower leg;   recurrent 03-25-2020  . History of esophageal stricture    s/p  dilatation 02-13-2018  . History of osteomyelitis 2020   left great toe    . Hypercholesterolemia   . Hypertension    followed by pcp  (nuclear stress test 03-11-2014 low risk w/ no ischemia, ef 65%  . Normocytic anemia    followed by pcp   (03-18-2020 had transfusion's 02/ 2021)  . Peripheral vascular disease ([place])   . 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 Thrombocytopenia (H[facility])    chronic  . Type 2 diabetes mellitus ([place])    followed by pcp---    (03-18-2020  pt stated does not check blood sugar)      Past Surgical History:  Procedure Laterality Date  . AMPUTATION Left 10/16/2019   Procedure: Left partial second ray resection; placement of antibiotic beads;  Surgeon: Evelina Bucy, DPM;  [facility];  Service: Podiatry;  Laterality: Left;  . AMPUTATION Left 11/13/2019   Procedure: Left Midfoot Amputation - Transmetatarsal vs. Lisfranc; Placement antibiotic beads;  Surgeon: Evelina Bucy, DPM;  [facility];  Service: Podiatry;  Laterality: Left;  . AMPUTATION Left 12/21/2019   Procedure: Chopart  Amputation left foot;  Surgeon: Evelina Bucy, DPM;  Location: [place];  Service: Podiatry;  Laterality: Left;  . AMPUTATION Left 06/24/2020   Procedure: LEFT BELOW KNEE AMPUTATION;  Surgeon: Newt Minion, MD;  Location: [place];  Service: Orthopedics;  Laterality: Left;  . AMPUTATION TOE Left 08/14/2019   Procedure: AMPUTATION GREAT TOE;  Surgeon: Evelina Bucy, DPM;  [facility];  Service: Podiatry;  Laterality: Left;  . APPLICATION OF WOUND VAC Left 12/21/2019   Procedure: Application Of Wound Vac;  Surgeon: Evelina Bucy, DPM;  Location: [facility];  Service: Podiatry;  Laterality: Left;  . BIOPSY  02/13/2018   Procedure: BIOPSY;  Surgeon: Danie Binder, MD;  [facility];  Service: Endoscopy;;  transverse colon biopsy, gastric biopsy  . COLONOSCOPY WITH PROPOFOL N/A 02/13/2018   Procedure: COLONOSCOPY WITH PROPOFOL;  Surgeon: Danie Binder, MD;  [facility];  Service: Endoscopy;  Laterality: N/A;  11:15am  . ESOPHAGOGASTRODUODENOSCOPY (EGD) WITH PROPOFOL N/A 02/13/2018   Procedure: ESOPHAGOGASTRODUODENOSCOPY (EGD) WITH PROPOFOL;  Surgeon: Danie Binder, MD;  [facility];  Service: Endoscopy;  Laterality: N/A;  . I & D EXTREMITY Left 07/16/2019   Procedure: Insicion  AND DEBRIDEMENT EXTREMITY;  Surgeon: Evelina Bucy, DPM;  Location: [facility];  Service: Podiatry;  Laterality: Left;  . I & D EXTREMITY Left 06/22/2020   Procedure: IRRIGATION AND DEBRIDEMENT EXTREMITY, left foot and ankle;  Surgeon: Evelina Bucy, DPM;  Location: [facility];  Service: Podiatry;  Laterality: Left;  . INCISION AND DRAINAGE OF WOUND Left 10/13/2019   Procedure: IRRIGATION AND DEBRIDEMENT WOUND OF LEFT FOOT AND FIRST METATARSAL RESECTION;  Surgeon: Evelina Bucy, DPM;  [facility];  Service: Podiatry;  Laterality: Left;  . IRRIGATION AND DEBRIDEMENT FOOT Left 11/11/2019   Procedure: Left Foot Wound Irrigation and Debridement;  Surgeon: Evelina Bucy, DPM;  [facility];  Service: Podiatry;  Laterality: Left;  . Left arm     Left arm repair (tendon and artery)  . PERCUTANEOUS PINNING  07/16/2019   Procedure: Open Reduction Percutaneous Pinning Extremity;  Surgeon: Evelina Bucy, DPM;  Location: [facility];  Service: Podiatry;;  . PILONIDAL CYST EXCISION N/A 08/08/2014   Procedure: CYST EXCISION PILONIDAL EXTENSIVE;  Surgeon: Jamesetta So, MD;  [facility];  Service: General;  Laterality: N/A;  . POLYPECTOMY  02/13/2018   Procedure: POLYPECTOMY;  Surgeon: Danie Binder, MD;  [facility];  Service: Endoscopy;;  transverse colon polyp hs, rectal polyps times 2  . SAVORY DILATION N/A 02/13/2018   Procedure: SAVORY DILATION;  Surgeon: Danie Binder, MD;  [facility];  Service: Endoscopy;  Laterality: N/A;  . WOUND DEBRIDEMENT Left 09/04/2019   Procedure: DEBRIDEMENT WOUND WITH COMPLEX  REPAIR OF DEHISCENCE;  Surgeon: Evelina Bucy, DPM;  Location: [facility];  Service: Podiatry;  Laterality: Left;  Leave patient on stretcher  . WOUND DEBRIDEMENT Left 10/16/2019   Procedure: Left foot wound debridement and closure;  Surgeon: Evelina Bucy, DPM;  [facility];  Service: Podiatry;  Laterality: Left;  . WOUND DEBRIDEMENT Left 12/18/2019   Procedure: LEFT FOOT DEBRIDEMENT WITH PARTIAL INCISION OF INFECTED BONE;  Surgeon: Evelina Bucy, DPM;  Location: [place];  Service: Podiatry;  Laterality: Left;  LEFT FOOT DEBRIDEMENT WITH PARTIAL INCISION OF INFECTED BONE      Social History:      Social History   Tobacco Use  . Smoking status: Former Smoker    Packs/day: 2.50    Years: 25.00    Pack years: 62.50    Types: Cigarettes    Quit date: 08/05/1984    Years since quitting: 36.6  . Smokeless tobacco: Never Used  Substance Use Topics  . Alcohol use: Yes    Alcohol/week: 50.0 standard drinks  Types: 50 Standard drinks or equivalent per week    Comment: Drinks 6 beers, liquor 4-6oz (both daily) x 35  years       Family History :     Family History  Problem Relation Age of Onset  . Heart attack Mother        Living, 53  . Cancer Father        Deceased  . Healthy Brother   . Healthy Sister   . Colon cancer Neg Hx   . Colon polyps Neg Hx       Home Medications:   Prior to Admission medications   Medication Sig Start Date End Date Taking? Authorizing Provider  diclofenac Sodium (VOLTAREN) 1 % GEL Apply 1 application topically daily as needed (pain).   Yes [provider]  lidocaine (XYLOCAINE) 5 % ointment Apply topically 2 (two) times daily as needed. 03/16/21  Yes [provider]  acetaminophen (TYLENOL) 325 MG tablet Take 2 tablets (650 mg total) by mouth every 6 (six) hours as needed for mild pain (or Fever >/= 101). 12/27/19   Arrien, [name], MD  albuterol (PROVENTIL HFA;VENTOLIN HFA) 108 (90 Base) MCG/ACT inhaler Inhale 2 puffs into the lungs every 6 (six) hours as needed for wheezing or shortness of breath. Patient not taking: Reported on 03/27/2021 10/31/18   Hayden Rasmussen, MD  ALPRAZolam Duanne Moron) 1 MG tablet Take 0.5 tablets (0.5 mg total) by mouth 4 (four) times daily as needed for anxiety. Patient not taking: Reported on 03/27/2021 01/21/20   Caren Griffins, MD  amitriptyline (ELAVIL) 100 MG tablet Take 100 mg by mouth at bedtime. Patient not taking: Reported on 03/27/2021    [provider]  atorvastatin (LIPITOR) 40 MG tablet Take 40 mg by mouth every evening.  Patient not taking: Reported on 03/27/2021 12/17/15   [provider]  cyclobenzaprine (FLEXERIL) 5 MG tablet Take 1 tablet (5 mg total) by mouth 3 (three) times daily as needed for muscle spasms. Patient not taking: No sig reported 07/10/20   Love, Ivan Anchors, PA-C  docusate sodium (COLACE) 100 MG capsule Take 100 mg by mouth daily as needed for mild constipation. Patient not taking: Reported on 03/27/2021    [provider]  doxycycline (VIBRA-TABS) 100 MG  tablet Take 100 mg by mouth 2 (two) times daily. Patient not taking: No sig reported 09/14/20   [provider]  DULoxetine (CYMBALTA) 60 MG capsule Take 60 mg by mouth daily. Patient not taking: Reported on 03/27/2021 11/18/19   [provider]  ferrous sulfate 325 (65 FE) MG tablet Take 1 tablet (325 mg total) by mouth daily. Patient not taking: Reported on 03/27/2021 11/15/19 11/14/20  Shelly Coss, MD  folic acid (FOLVITE) 1 MG tablet Take 1 tablet (1 mg total) by mouth daily. Patient not taking: No sig reported 07/11/20   Love, Ivan Anchors, PA-C  furosemide (LASIX) 20 MG tablet Take 20 mg by mouth daily as needed. Patient not taking: Reported on 03/27/2021 03/18/20   [provider]  gabapentin (NEURONTIN) 300 MG capsule TAKE 3 CAPSULES BY MOUTH IN THE MORNING, 3 CAPSULES AT NOON, AND 4 CAPSULES AT BEDTIME FOR NERVE PAIN. Patient not taking: No sig reported 07/03/20   Narda Amber K, DO  glimepiride (AMARYL) 4 MG tablet Take 0.5 tablets (2 mg total) by mouth daily. Patient not taking: No sig reported 07/10/20   Love, Ivan Anchors, PA-C  glycopyrrolate (ROBINUL) 1 MG tablet Take 1 mg by mouth  2 (two) times daily.  Patient not taking: Reported on 03/27/2021 12/17/15   [provider]  hydrALAZINE (APRESOLINE) 50 MG tablet Take 1 tablet (50 mg total) by mouth every 8 (eight) hours. Patient not taking: No sig reported 07/10/20   Love, Ivan Anchors, PA-C  hydrOXYzine (ATARAX/VISTARIL) 25 MG tablet Take 25 mg by mouth 4 (four) times daily as needed. Patient not taking: Reported on 03/27/2021 05/18/20   [provider]  lisinopril (ZESTRIL) 20 MG tablet Take 20 mg by mouth daily. Patient not taking: No sig reported 07/28/20   [provider]  lisinopril (ZESTRIL) 40 MG tablet Take 1 tablet (40 mg total) by mouth at bedtime. Patient not taking: No sig reported 07/10/20   Love, Ivan Anchors, PA-C  morphine (MS CONTIN) 30 MG 12 hr tablet Take 30 mg by mouth every 12  (twelve) hours.  Patient not taking: Reported on 03/27/2021 05/21/20   [provider]  Multiple Vitamin (MULTIVITAMIN WITH MINERALS) TABS tablet Take 1 tablet by mouth daily. Patient not taking: No sig reported 07/11/20   Love, Ivan Anchors, PA-C  omeprazole (PRILOSEC) 20 MG capsule TAKE 1 CAPSULE WITH BREAKFAST AND SUPPER. Patient not taking: No sig reported 07/29/20   Mahala Menghini, PA-C  oxycodone (ROXICODONE) 30 MG immediate release tablet Take 30 mg by mouth 4 (four) times daily as needed. Patient not taking: Reported on 03/27/2021 05/08/20   [provider]  primidone (MYSOLINE) 50 MG tablet TAKE (1) TABLET BY MOUTH TWICE DAILY. Patient not taking: No sig reported 07/03/20   Narda Amber K, DO  senna-docusate (SENOKOT-S) 8.6-50 MG tablet Take 2 tablets by mouth at bedtime. Patient not taking: No sig reported 07/10/20   Love, Ivan Anchors, PA-C  silver sulfADIAZINE (SILVADENE) 1 % cream Apply pea-sized amount to skin fissure daily. Patient not taking: No sig reported 11/10/20   Evelina Bucy, DPM  thiamine 100 MG tablet Take 1 tablet (100 mg total) by mouth daily. Patient not taking: No sig reported 07/11/20   Love, Ivan Anchors, PA-C  vitamin B-12 100 MCG tablet Take 1 tablet (100 mcg total) by mouth daily. Patient not taking: No sig reported 07/11/20   Love, Ivan Anchors, PA-C     Allergies:    No Known Allergies   Physical Exam:   Vitals  Blood pressure (!) 175/85, pulse 89, temperature 99.2 F (37.3 C), temperature source Oral, resp. rate 17, height 6' 2"  (1.88 m), weight 131.5 kg, SpO2 98 %.  1.  General: Patient laying supine in bed, disheveled, no acute distress  2. Psychiatric: Flat affect, behavior normal for situation, pleasant and cooperative with exam  3. Neurologic: Face is symmetric, speech and language are normal, moves all 4 extremities voluntarily, chronic numbness of right  forefoot, left arm resting tremor, no acute deficits on limited exam  4. HEENMT:   Head is atraumatic, normocephalic, pupils are reactive, neck is supple, trachea is midline, mucous membranes are moist  5. Respiratory : Lungs are clear to auscultation BL, without wheezes, rhonchi, or rales, cyanosis of right great toe, no clubbing  6. Cardiovascular : Heart rate is normal, rhythm is regular, no murmurs rubs or gallops, cyanosis of right great toe, no peripheral edema  7. Gastrointestinal:  Abdomen is soft, non distended and non tender to palpation, bowel sounds active  8. Skin:  Venous stasis changes of right lower extremity, ulcer on ball of right foot with granulation tissue, and malodorous, bloody discharge, cyanosis and blisters of right  great toe  9.Musculoskeletal:  Left BKA, no calf tenderness, edema of right great toe    Data Review:    CBC Recent Labs  Lab 03/27/21 1805  WBC 7.6  HGB 12.4*  HCT 38.1*  PLT 218  MCV 88.6  MCH 28.8  MCHC 32.5  RDW 13.1  LYMPHSABS 0.8  MONOABS 0.4  EOSABS 0.1  BASOSABS 0.0   ------------------------------------------------------------------------------------------------------------------  Results for orders placed or performed during the hospital encounter of 03/27/21 (from the past 48 hour(s))  CBG monitoring, ED     Status: Abnormal   Collection Time: 03/27/21  4:59 PM  Result Value Ref Range   Glucose-Capillary 169 (H) 70 - 99 mg/dL    Comment: Glucose reference range applies only to samples taken after fasting for at least 8 hours.  Lactic acid, plasma     Status: None   Collection Time: 03/27/21  6:05 PM  Result Value Ref Range   Lactic Acid, Venous 1.5 0.5 - 1.9 mmol/L    Comment: Performed at [facility], [address] [place], [place] [address]  Comprehensive metabolic panel     Status: Abnormal   Collection Time: 03/27/21  6:05 PM  Result Value Ref Range   Sodium 131 (L) 135 - 145 mmol/L   Potassium 3.6 3.5 - 5.1 mmol/L   Chloride 96 (L) 98 - 111 mmol/L   CO2 24 22 - 32 mmol/L   Glucose,  Bld 176 (H) 70 - 99 mg/dL    Comment: Glucose reference range applies only to samples taken after fasting for at least 8 hours.   BUN 17 6 - 20 mg/dL   Creatinine, Ser 1.08 0.61 - 1.24 mg/dL   Calcium 8.7 (L) 8.9 - 10.3 mg/dL   Total Protein 8.6 (H) 6.5 - 8.1 g/dL   Albumin 3.1 (L) 3.5 - 5.0 g/dL   AST 67 (H) 15 - 41 U/L   ALT 29 0 - 44 U/L   Alkaline Phosphatase 93 38 - 126 U/L   Total Bilirubin 0.7 0.3 - 1.2 mg/dL   GFR, Estimated >60 >60 mL/min    Comment: (NOTE) Calculated using the CKD-EPI Creatinine Equation (2021)    Anion gap 11 5 - 15    Comment: Performed at [facility], [address] [place], [place] [address]  CBC WITH DIFFERENTIAL     Status: Abnormal   Collection Time: 03/27/21  6:05 PM  Result Value Ref Range   WBC 7.6 4.0 - 10.5 K/uL   RBC 4.30 4.22 - 5.81 MIL/uL   Hemoglobin 12.4 (L) 13.0 - 17.0 g/dL   HCT 38.1 (L) 39.0 - 52.0 %   MCV 88.6 80.0 - 100.0 fL   MCH 28.8 26.0 - 34.0 pg   MCHC 32.5 30.0 - 36.0 g/dL   RDW 13.1 11.5 - 15.5 %   Platelets 218 150 - 400 K/uL   nRBC 0.0 0.0 - 0.2 %   Neutrophils Relative % 80 %   Neutro Abs 6.2 1.7 - 7.7 K/uL   Lymphocytes Relative 11 %   Lymphs Abs 0.8 0.7 - 4.0 K/uL   Monocytes Relative 6 %   Monocytes Absolute 0.4 0.1 - 1.0 K/uL   Eosinophils Relative 1 %   Eosinophils Absolute 0.1 0.0 - 0.5 K/uL   Basophils Relative 1 %   Basophils Absolute 0.0 0.0 - 0.1 K/uL   Immature Granulocytes 1 %   Abs Immature Granulocytes 0.07 0.00 - 0.07 K/uL    Comment: Performed at [facility], [address] [place], [place] [address]  Protime-INR     Status: None   Collection Time: 03/27/21  6:05 PM  Result Value Ref Range   Prothrombin Time 14.1 11.4 - 15.2 seconds   INR 1.1 0.8 - 1.2    Comment: (NOTE) INR goal varies based on device and disease states. Performed at [facility], [address] [place], [place] [address]   APTT     Status: None   Collection Time: 03/27/21  6:05 PM  Result Value Ref Range   aPTT 29 24  - 36 seconds    Comment: Performed at [facility], [address], [place], [place] [address]  Blood Culture (routine x 2)     Status: None (Preliminary result)   Collection Time: 03/27/21  6:05 PM   Specimen: Vein; Blood  Result Value Ref Range   Specimen Description RIGHT ANTECUBITAL    Special Requests      Blood Culture adequate volume BOTTLES DRAWN AEROBIC AND ANAEROBIC Performed at [facility], [address], [place],  [address]    Culture PENDING    Report Status PENDING   Resp Panel by RT-PCR (Flu A&B, Covid) Nasopharyngeal Swab     Status: None   Collection Time: 03/27/21  6:07 PM   Specimen: Nasopharyngeal Swab; Nasopharyngeal(NP) swabs in vial transport medium  Result Value Ref Range   SARS Coronavirus 2 by RT PCR NEGATIVE NEGATIVE    Comment: (NOTE) SARS-CoV-2 target nucleic acids are NOT DETECTED.  The SARS-CoV-2 RNA is generally detectable in upper respiratory specimens during the acute phase of infection. The lowest concentration of SARS-CoV-2 viral copies this assay can detect is 138 copies/mL. A negative result does not preclude SARS-Cov-2 infection and should not be used as the sole basis for treatment or other patient management decisions. A negative result may occur with  improper specimen collection/handling, submission of specimen other than nasopharyngeal swab, presence of viral mutation(s) within the areas targeted by this assay, and inadequate number of viral copies(<138 copies/mL). A negative result must be combined with clinical observations, patient history, and epidemiological information. The expected result is Negative.  Fact Sheet for Patients:  EntrepreneurPulse.com.au  Fact Sheet for Healthcare Providers:  IncredibleEmployment.be  This test is no t yet approved or cleared by the [place] FDA and  has been authorized for detection and/or diagnosis of SARS-CoV-2 by FDA under an Emergency Use  Authorization (EUA). This EUA will remain  in effect (meaning this test can be used) for the duration of the COVID-19 declaration under Section 564(b)(1) of the Act, 21 U.S.C.section 360bbb-3(b)(1), unless the authorization is terminated  or revoked sooner.       Influenza A by PCR NEGATIVE NEGATIVE   Influenza B by PCR NEGATIVE NEGATIVE    Comment: (NOTE) The Xpert Xpress SARS-CoV-2/FLU/RSV plus assay is intended as an aid in the diagnosis of influenza from Nasopharyngeal swab specimens and should not be used as a sole basis for treatment. Nasal washings and aspirates are unacceptable for Xpert Xpress SARS-CoV-2/FLU/RSV testing.  Fact Sheet for Patients: EntrepreneurPulse.com.au  Fact Sheet for Healthcare Providers: IncredibleEmployment.be  This test is not yet approved or cleared by the [place] FDA and has been authorized for detection and/or diagnosis of SARS-CoV-2 by FDA under an Emergency Use Authorization (EUA). This EUA will remain in effect (meaning this test can be used) for the duration of the COVID-19 declaration under Section 564(b)(1) of the Act, 21 U.S.C. section 360bbb-3(b)(1), unless the authorization is  terminated or revoked.  Performed at [facility], [address], [place], [place] [address]   Blood Culture (routine x 2)     Status: None (Preliminary result)   Collection Time: 03/27/21  6:20 PM   Specimen: Vein; Blood  Result Value Ref Range   Specimen Description BLOOD LEFT HAND    Special Requests      Blood Culture adequate volume BOTTLES DRAWN AEROBIC AND ANAEROBIC Performed at [facility], [address] [place], [place] [address]    Culture PENDING    Report Status PENDING   Lactic acid, plasma     Status: None   Collection Time: 03/27/21  7:53 PM  Result Value Ref Range   Lactic Acid, Venous 0.8 0.5 - 1.9 mmol/L    Comment: Performed at [facility], [address], [place], [place] [address]     Chemistries  Recent Labs  Lab 03/27/21 1805  NA 131*  K 3.6  CL 96*  CO2 24  GLUCOSE 176*  BUN 17  CREATININE 1.08  CALCIUM 8.7*  AST 67*  ALT 29  ALKPHOS 93  BILITOT 0.7   ------------------------------------------------------------------------------------------------------------------  ------------------------------------------------------------------------------------------------------------------ GFR: Estimated Creatinine Clearance: 110.1 mL/min (by C-G formula based on SCr of 1.08 mg/dL). Liver Function Tests: Recent Labs  Lab 03/27/21 1805  AST 67*  ALT 29  ALKPHOS 93  BILITOT 0.7  PROT 8.6*  ALBUMIN 3.1*   No results for input(s): LIPASE, AMYLASE in the last 168 hours. No results for input(s): AMMONIA in the last 168 hours. Coagulation Profile: Recent Labs  Lab 03/27/21 1805  INR 1.1   Cardiac Enzymes: No results for input(s): CKTOTAL, CKMB, CKMBINDEX, TROPONINI in the last 168 hours. BNP (last 3 results) No results for input(s): PROBNP in the last 8760 hours. HbA1C: No results for input(s): HGBA1C in the last 72 hours. CBG: Recent Labs  Lab 03/27/21 1659  GLUCAP 169*   Lipid Profile: No results for input(s): CHOL, HDL, LDLCALC, TRIG, CHOLHDL, LDLDIRECT in the last 72 hours. Thyroid Function Tests: No results for input(s): TSH, T4TOTAL, FREET4, T3FREE, THYROIDAB in the last 72 hours. Anemia Panel: No results for input(s): VITAMINB12, FOLATE, FERRITIN, TIBC, IRON, RETICCTPCT in the last 72 hours.  --------------------------------------------------------------------------------------------------------------- Urine analysis:    Component Value Date/Time   COLORURINE YELLOW 06/21/2020 1712   APPEARANCEUR CLEAR 06/21/2020 1712   LABSPEC 1.014 06/21/2020 1712   PHURINE 5.0 06/21/2020 1712   GLUCOSEU 150 (A) 06/21/2020 1712   HGBUR LARGE (A) 06/21/2020 1712   BILIRUBINUR NEGATIVE 06/21/2020 1712   KETONESUR NEGATIVE 06/21/2020 1712    PROTEINUR 100 (A) 06/21/2020 1712   UROBILINOGEN 4.0 (H) 10/09/2008 1114   NITRITE POSITIVE (A) 06/21/2020 1712   LEUKOCYTESUR NEGATIVE 06/21/2020 1712      Imaging Results:    DG Chest Port 1 View  Result Date: 03/27/2021 CLINICAL DATA:  Known right foot drainage with first toe ulcer and possible sepsis, initial encounter EXAM: PORTABLE CHEST 1 VIEW COMPARISON:  02/24/2021 FINDINGS: The heart size and mediastinal contours are within normal limits. Both lungs are clear. The visualized skeletal structures are unremarkable. IMPRESSION: No active disease. Electronically Signed   By: Inez Catalina M.D.   On: 03/27/2021 17:45   DG Foot Complete Right  Result Date: 03/27/2021 CLINICAL DATA:  First toe ulcer EXAM: RIGHT FOOT COMPLETE - 3+ VIEW COMPARISON:  09/13/2021 FINDINGS: There is a soft tissue ulcer beneath the head of the first metatarsal. Considerable subcutaneous air is noted consistent with localized infection. No acute  fracture is seen. There is a lucent area in the medial aspect of the first metatarsal head which appears slightly larger than that seen on the prior exam. Possibility of osteomyelitis would deserve consideration. MRI would be more sensitive in this regard. No other bony abnormality is noted. IMPRESSION: Soft tissue ulcer with considerable subcutaneous air consistent with localized infection. There is an area of lucency in the medial aspect of the first metatarsal head which appears slightly larger than that seen on the prior exam. Underlying osteomyelitis would be difficult to exclude. MRI with the flow in this regard as clinically necessary. Electronically Signed   By: Inez Catalina M.D.   On: 03/27/2021 17:49      Assessment & Plan:    Principal Problem:   Osteomyelitis due to type 2 diabetes mellitus ([place]) Active Problems:   Hyperlipidemia associated with type 2 diabetes mellitus ([facility])   ETOH abuse   Alcoholic peripheral neuropathy ([facility])   Diabetic neuropathy ([place])    Diabetic foot ulcer ([place])   1. Osteomyelitis 1. Xray shows concerns for osteo 2. 2/2 to #2 3. CRP and ESR pending 4. MRI right foot ordered for AM 5. Consult to Dr. Constance Haw - rec's [name], continue with antibiotics, possibly amputation in a couple of days 6. Rocephin and Vanc started in the ED - continue 7. Continue to monitor on med-sug 2. Diabetic foot ulcer 1. See plan above 2. Check Hgb A1C 3. Patient reports non compliance with medication 4. Monitors CBGs 5. Sliding scale coverage 3. Mild protein cal mal 1. Encourage nutrient dense food choices 4. EtOH Abuse 1. Recently quit drinking 2. Last drink 2 weeks ago 3. Start CIWA in case of lingering withdrawals (Left arm tremor on exam) 5. HLD 1. Continue statin 6. PVD 1. [name] in the AM 2. Encourage compliance with medications.    DVT Prophylaxis-   heparin - SCD  AM Labs Ordered, also please review Full Orders  Family Communication: Admission, patients condition and plan of care including tests being ordered have been discussed with the patient and mother who indicate understanding and agree with the plan and Code Status.  Code Status:  Full Admission status: Inpatient :The appropriate admission status for this patient is INPATIENT. Inpatient status is judged to be reasonable and necessary in order to provide the required intensity of service to ensure the patient's safety. The patient's presenting symptoms, physical exam findings, and initial radiographic and laboratory data in the context of their chronic comorbidities is felt to place them at high risk for further clinical deterioration. Furthermore, it is not anticipated that the patient will be medically stable for discharge from the hospital within 2 midnights of admission. The following factors support the admission status of inpatient.     The patient's presenting symptoms include swelling of right great toe The worrisome physical exam findings include malodorous  diabetic foot ulcer The initial radiographic and laboratory data are worrisome because of concern for osteomyelitis The chronic co-morbidities include PVD, HTN, DMII       * I certify that at the point of admission it is my clinical judgment that the patient will require inpatient hospital care spanning beyond 2 midnights from the point of admission due to high intensity of service, high risk for further deterioration and high frequency of surveillance required.*  Time spent in minutes : [place]

## 2021-03-27 NOTE — ED Notes (Signed)
Reminded pt I needed urine

## 2021-03-27 NOTE — ED Notes (Signed)
Attempted to call report. No answer.

## 2021-03-28 ENCOUNTER — Inpatient Hospital Stay (HOSPITAL_COMMUNITY): Payer: Medicaid Other

## 2021-03-28 ENCOUNTER — Encounter (HOSPITAL_COMMUNITY): Payer: Self-pay | Admitting: Family Medicine

## 2021-03-28 DIAGNOSIS — M869 Osteomyelitis, unspecified: Secondary | ICD-10-CM | POA: Diagnosis not present

## 2021-03-28 DIAGNOSIS — E1152 Type 2 diabetes mellitus with diabetic peripheral angiopathy with gangrene: Secondary | ICD-10-CM

## 2021-03-28 DIAGNOSIS — E1169 Type 2 diabetes mellitus with other specified complication: Secondary | ICD-10-CM | POA: Diagnosis not present

## 2021-03-28 LAB — BLOOD CULTURE ID PANEL (REFLEXED) - BCID2

## 2021-03-28 LAB — GLUCOSE, CAPILLARY
Glucose-Capillary: 136 mg/dL — ABNORMAL HIGH (ref 70–99)
Glucose-Capillary: 242 mg/dL — ABNORMAL HIGH (ref 70–99)
Glucose-Capillary: 159 mg/dL — ABNORMAL HIGH (ref 70–99)
Glucose-Capillary: 155 mg/dL — ABNORMAL HIGH (ref 70–99)

## 2021-03-28 LAB — HIV ANTIBODY (ROUTINE TESTING W REFLEX): HIV Screen 4th Generation wRfx: NONREACTIVE

## 2021-03-28 LAB — MAGNESIUM: Magnesium: 1.8 mg/dL (ref 1.7–2.4)

## 2021-03-28 MED ORDER — COVID-19 MRNA VACC (MODERNA) 100 MCG/0.5ML IM SUSP
0.5000 mL | Freq: Once | INTRAMUSCULAR | Status: AC
  Filled 2021-03-28: qty 0.5

## 2021-03-28 MED ORDER — AMLODIPINE BESYLATE 10 MG PO TABS
10.0000 mg | ORAL_TABLET | Freq: Every day | ORAL | Status: DC
  Administered 2021-03-28 – 2021-04-02 (×5): 10 mg via ORAL
  Filled 2021-03-28: qty 1
  Filled 2021-03-28: qty 2
  Filled 2021-03-28 (×3): qty 1

## 2021-03-28 MED ORDER — VANCOMYCIN HCL 2000 MG/400ML IV SOLN
2000.0000 mg | Freq: Two times a day (BID) | INTRAVENOUS | Status: AC
  Administered 2021-03-28 – 2021-03-29 (×2): 2000 mg via INTRAVENOUS
  Filled 2021-03-28 (×3): qty 400

## 2021-03-28 MED ORDER — ASPIRIN EC 81 MG PO TBEC
81.0000 mg | DELAYED_RELEASE_TABLET | Freq: Every day | ORAL | Status: DC
  Administered 2021-03-28 – 2021-04-02 (×5): 81 mg via ORAL
  Filled 2021-03-28 (×5): qty 1

## 2021-03-28 MED ORDER — SODIUM CHLORIDE 0.9 % IV SOLN: INTRAVENOUS | Status: AC

## 2021-03-28 NOTE — Progress Notes (Signed)
Pharmacy Antibiotic Note  Eugene Diaz is a 55 y.o. male admitted on 03/27/2021 with cellulitis./osteomyelitis  Pharmacy has been consulted for Vancomycin dosing. Some concern for osteomyelitis on xray  Plan: Increase Vancomycin 2000mg  IV every 12 hours.  AUC 489 Also on ceftriaxone 2gm IV q24h F/U cxs and clinical progress Monitor V/S, labs and levels as indicated  Height: 6\' 2"  (188 cm) Weight: 131.5 kg (290 lb) IBW/kg (Calculated) : 82.2  Temp (24hrs), Avg:98.8 F (37.1 C), Min:98.3 F (36.8 C), Max:99.2 F (37.3 C)  Recent Labs  Lab 03/27/21 1805 03/27/21 1953  WBC 7.6  --   CREATININE 1.08  --   LATICACIDVEN 1.5 0.8    Estimated Creatinine Clearance: 110.1 mL/min (by C-G formula based on SCr of 1.08 mg/dL).    No Known Allergies  Antimicrobials this admission: Vancomycin 5/28 >>  Ceftriaxone 5/28 >>   Microbiology results:  BC5/28x: pending  MRSA PCR:   Thank you for allowing pharmacy to be a part of this patient's care.  Isac Sarna, BS Pharm D, BCPS Clinical Pharmacist Pager [phone] 03/28/2021 9:20 AM

## 2021-03-28 NOTE — Consult Note (Signed)
[facility] Consult  Reason for Consult: First toe right osteomyelitis  Referring Physician:  Dr. Jamesetta Geralds MD   Chief Complaint    Wound Infection      HPI: Eugene Diaz is a 55 y.o. male with a history of DM, neuropathy and prior L BKA who has had a wound on his right foot for 3 + years he reports. He has known PVD per report normal ABIs noted in the past notes. He has been followed by [name] for a first metatarsal wound and the patient says his sister had been helping with the dressings but lives a distance from him. He says he has been doing his wound care. He last saw [name] in March. He says the area has been getting more swollen and draining more in the last few weeks. He reported some subjective fevers and has not been feeling well over the course of the last few days.   I was notified last night of possible osteomyelitis and asked for [name]. I was unaware of him being a patient of [name] until this AM.   Past Medical History:  Diagnosis Date  . Alcohol abuse   . Alcoholic peripheral neuropathy ([place])   . Anxiety   . Arthritis   . Asthma    followed by pcp  . B12 deficiency   . Depression   . Diabetic neuropathy ([place])   . Edema of both lower extremities   . Essential tremor    neurologist-- dr patel--- due to alcohol abuse  . GERD (gastroesophageal reflux disease)   . History of cellulitis 2020   left lower leg;   recurrent 03-25-2020  . History of esophageal stricture    s/p  dilatation 02-13-2018  . History of osteomyelitis 2020   left great toe    . Hypercholesterolemia   . Hypertension    followed by pcp  (nuclear stress test 03-11-2014 low risk w/ no ischemia, ef 65%  . Normocytic anemia    followed by pcp   (03-18-2020 had transfusion's 02/ 2021)  . Peripheral vascular disease ([place])   . 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 Thrombocytopenia (HCC)    chronic  . Type 2 diabetes mellitus ([place])    followed by pcp---    (03-18-2020  pt stated does not check blood sugar)    Past Surgical History:  Procedure Laterality Date  . AMPUTATION Left 10/16/2019   Procedure: Left partial second ray resection; placement of antibiotic beads;  Surgeon: Evelina Bucy, DPM;  [facility]: [facility];  Service: Podiatry;  Laterality: Left;  . AMPUTATION Left 11/13/2019   Procedure: Left Midfoot Amputation - Transmetatarsal vs. Lisfranc; Placement antibiotic beads;  Surgeon: Evelina Bucy, DPM;  [facility];  Service: Podiatry;  Laterality: Left;  . AMPUTATION Left 12/21/2019   Procedure: Chopart Amputation left foot;  Surgeon: Evelina Bucy, DPM;  Location: [place];  Service: Podiatry;  Laterality: Left;  . AMPUTATION Left 06/24/2020   Procedure: LEFT BELOW KNEE AMPUTATION;  Surgeon: Newt Minion, MD;  Location: [place];  Service: Orthopedics;  Laterality: Left;  . AMPUTATION TOE Left 08/14/2019   Procedure: AMPUTATION GREAT TOE;  Surgeon: Evelina Bucy, DPM;  [facility];  Service: Podiatry;  Laterality: Left;  . APPLICATION OF WOUND VAC Left 12/21/2019   Procedure: Application Of Wound Vac;  Surgeon: Evelina Bucy, DPM;  Location: [place];  Service: Podiatry;  Laterality: Left;  . BIOPSY  02/13/2018   Procedure: BIOPSY;  Surgeon: Danie Binder, MD;  [facility];  Service: Endoscopy;;  transverse colon biopsy, gastric biopsy  . COLONOSCOPY WITH PROPOFOL N/A 02/13/2018   Procedure: COLONOSCOPY WITH PROPOFOL;  Surgeon: Danie Binder, MD;  [facility];  Service: Endoscopy;  Laterality: N/A;  11:15am  . ESOPHAGOGASTRODUODENOSCOPY (EGD) WITH PROPOFOL N/A 02/13/2018   Procedure: ESOPHAGOGASTRODUODENOSCOPY (EGD) WITH PROPOFOL;  Surgeon:  Danie Binder, MD;  [facility];  Service: Endoscopy;  Laterality: N/A;  . I & D EXTREMITY Left 07/16/2019   Procedure: Insicion  AND DEBRIDEMENT EXTREMITY;  Surgeon: Evelina Bucy, DPM;  Location: [place];  Service: Podiatry;  Laterality: Left;  . I & D EXTREMITY Left 06/22/2020   Procedure: IRRIGATION AND DEBRIDEMENT EXTREMITY, left foot and ankle;  Surgeon: Evelina Bucy, DPM;  Location: [place];  Service: Podiatry;  Laterality: Left;  . INCISION AND DRAINAGE OF WOUND Left 10/13/2019   Procedure: IRRIGATION AND DEBRIDEMENT WOUND OF LEFT FOOT AND FIRST METATARSAL RESECTION;  Surgeon: Evelina Bucy, DPM;  [facility];  Service: Podiatry;  Laterality: Left;  . IRRIGATION AND DEBRIDEMENT FOOT Left 11/11/2019   Procedure: Left Foot Wound Irrigation and Debridement;  Surgeon: Evelina Bucy, DPM;  [facility];  Service: Podiatry;  Laterality: Left;  . Left arm     Left arm repair (tendon and artery)  . PERCUTANEOUS PINNING  07/16/2019   Procedure: Open Reduction Percutaneous Pinning Extremity;  Surgeon: Evelina Bucy, DPM;  Location: [place];  Service: Podiatry;;  . PILONIDAL CYST EXCISION N/A 08/08/2014   Procedure: CYST EXCISION PILONIDAL EXTENSIVE;  Surgeon: Jamesetta So, MD;  [facility];  Service: General;  Laterality: N/A;  . POLYPECTOMY  02/13/2018   Procedure: POLYPECTOMY;  Surgeon: Danie Binder, MD;  [facility];  Service: Endoscopy;;  transverse colon polyp hs, rectal polyps times 2  . SAVORY DILATION N/A 02/13/2018   Procedure: SAVORY DILATION;  Surgeon: Danie Binder, MD;  [facility];  Service: Endoscopy;  Laterality: N/A;  . WOUND DEBRIDEMENT Left 09/04/2019   Procedure: DEBRIDEMENT WOUND WITH COMPLEX  REPAIR OF DEHISCENCE;  Surgeon: Evelina Bucy, DPM;  Location: [place];  Service: Podiatry;  Laterality: Left;  Leave patient on stretcher  . WOUND DEBRIDEMENT Left 10/16/2019   Procedure: Left foot wound  debridement and closure;  Surgeon: Evelina Bucy, DPM;  [facility];  Service: Podiatry;  Laterality: Left;  . WOUND DEBRIDEMENT Left 12/18/2019   Procedure: LEFT FOOT DEBRIDEMENT WITH PARTIAL INCISION OF INFECTED BONE;  Surgeon: Evelina Bucy, DPM;  Location: [place];  Service: Podiatry;  Laterality: Left;  LEFT FOOT DEBRIDEMENT WITH PARTIAL INCISION OF INFECTED BONE    Family History  Problem Relation Age of Onset  . Heart attack Mother        Living, 51  . Cancer Father        Deceased  . Healthy Brother   . Healthy Sister   . Colon cancer Neg Hx   . Colon polyps Neg Hx     Social History   Tobacco Use  . Smoking status:  Former Smoker    Packs/day: 2.50    Years: 25.00    Pack years: 62.50    Types: Cigarettes    Quit date: 08/05/1984    Years since quitting: 36.6  . Smokeless tobacco: Never Used  Vaping Use  . Vaping Use: Never used  Substance Use Topics  . Alcohol use: Yes    Alcohol/week: 50.0 standard drinks    Types: 50 Standard drinks or equivalent per week    Comment: Drinks 6 beers, liquor 4-6oz (both daily) x 35 years  . Drug use: Not Currently    Types: Marijuana    Medications:  I have reviewed the patient's current medications. Prior to Admission:  Medications Prior to Admission  Medication Sig Dispense Refill Last Dose  . diclofenac Sodium (VOLTAREN) 1 % GEL Apply 1 application topically daily as needed (pain).   Past Month at Unknown time  . lidocaine (XYLOCAINE) 5 % ointment Apply topically 2 (two) times daily as needed.     Marland Kitchen acetaminophen (TYLENOL) 325 MG tablet Take 2 tablets (650 mg total) by mouth every 6 (six) hours as needed for mild pain (or Fever >/= 101). 20 tablet 0   . albuterol (PROVENTIL HFA;VENTOLIN HFA) 108 (90 Base) MCG/ACT inhaler Inhale 2 puffs into the lungs every 6 (six) hours as needed for wheezing or shortness of breath. (Patient not taking: Reported on 03/27/2021) 1 Inhaler 2 Not Taking at Unknown time  . ALPRAZolam  (XANAX) 1 MG tablet Take 0.5 tablets (0.5 mg total) by mouth 4 (four) times daily as needed for anxiety. (Patient not taking: Reported on 03/27/2021) 12 tablet 0 Not Taking at Unknown time  . amitriptyline (ELAVIL) 100 MG tablet Take 100 mg by mouth at bedtime. (Patient not taking: Reported on 03/27/2021)   Not Taking at Unknown time  . atorvastatin (LIPITOR) 40 MG tablet Take 40 mg by mouth every evening.  (Patient not taking: Reported on 03/27/2021)  10 Not Taking at Unknown time  . cyclobenzaprine (FLEXERIL) 5 MG tablet Take 1 tablet (5 mg total) by mouth 3 (three) times daily as needed for muscle spasms. (Patient not taking: No sig reported) 30 tablet 0 Not Taking at Unknown time  . docusate sodium (COLACE) 100 MG capsule Take 100 mg by mouth daily as needed for mild constipation. (Patient not taking: Reported on 03/27/2021)   Not Taking at Unknown time  . doxycycline (VIBRA-TABS) 100 MG tablet Take 100 mg by mouth 2 (two) times daily. (Patient not taking: No sig reported)   Not Taking at Unknown time  . DULoxetine (CYMBALTA) 60 MG capsule Take 60 mg by mouth daily. (Patient not taking: Reported on 03/27/2021)   Not Taking at Unknown time  . ferrous sulfate 325 (65 FE) MG tablet Take 1 tablet (325 mg total) by mouth daily. (Patient not taking: Reported on 03/27/2021) 30 tablet 1 Not Taking at Unknown time  . folic acid (FOLVITE) 1 MG tablet Take 1 tablet (1 mg total) by mouth daily. (Patient not taking: No sig reported) 30 tablet 0 Not Taking at Unknown time  . furosemide (LASIX) 20 MG tablet Take 20 mg by mouth daily as needed. (Patient not taking: Reported on 03/27/2021)   Not Taking at Unknown time  . gabapentin (NEURONTIN) 300 MG capsule TAKE 3 CAPSULES BY MOUTH IN THE MORNING, 3 CAPSULES AT NOON, AND 4 CAPSULES AT BEDTIME FOR NERVE PAIN. (Patient not taking: No sig reported) 300 capsule 1 Not Taking at Unknown time  . glimepiride (AMARYL)  4 MG tablet Take 0.5 tablets (2 mg total) by mouth daily.  (Patient not taking: No sig reported) 30 tablet 0 Not Taking at Unknown time  . glycopyrrolate (ROBINUL) 1 MG tablet Take 1 mg by mouth 2 (two) times daily.  (Patient not taking: Reported on 03/27/2021)  2 Not Taking at Unknown time  . hydrALAZINE (APRESOLINE) 50 MG tablet Take 1 tablet (50 mg total) by mouth every 8 (eight) hours. (Patient not taking: No sig reported) 90 tablet 0 Not Taking at Unknown time  . hydrOXYzine (ATARAX/VISTARIL) 25 MG tablet Take 25 mg by mouth 4 (four) times daily as needed. (Patient not taking: Reported on 03/27/2021)   Not Taking at Unknown time  . lisinopril (ZESTRIL) 20 MG tablet Take 20 mg by mouth daily. (Patient not taking: No sig reported)   Not Taking at Unknown time  . lisinopril (ZESTRIL) 40 MG tablet Take 1 tablet (40 mg total) by mouth at bedtime. (Patient not taking: No sig reported) 30 tablet 0 Not Taking at Unknown time  . morphine (MS CONTIN) 30 MG 12 hr tablet Take 30 mg by mouth every 12 (twelve) hours.  (Patient not taking: Reported on 03/27/2021)   Not Taking at Unknown time  . Multiple Vitamin (MULTIVITAMIN WITH MINERALS) TABS tablet Take 1 tablet by mouth daily. (Patient not taking: No sig reported)   Not Taking at Unknown time  . omeprazole (PRILOSEC) 20 MG capsule TAKE 1 CAPSULE WITH BREAKFAST AND SUPPER. (Patient not taking: No sig reported) 60 capsule 3 Not Taking at Unknown time  . oxycodone (ROXICODONE) 30 MG immediate release tablet Take 30 mg by mouth 4 (four) times daily as needed. (Patient not taking: Reported on 03/27/2021)   Not Taking at Unknown time  . primidone (MYSOLINE) 50 MG tablet TAKE (1) TABLET BY MOUTH TWICE DAILY. (Patient not taking: No sig reported) 60 tablet 1 Not Taking at Unknown time  . senna-docusate (SENOKOT-S) 8.6-50 MG tablet Take 2 tablets by mouth at bedtime. (Patient not taking: No sig reported) 60 tablet 0 Not Taking at Unknown time  . silver sulfADIAZINE (SILVADENE) 1 % cream Apply pea-sized amount to skin fissure  daily. (Patient not taking: No sig reported) 50 g 0 Not Taking at Unknown time  . thiamine 100 MG tablet Take 1 tablet (100 mg total) by mouth daily. (Patient not taking: No sig reported) 30 tablet 0 Not Taking at Unknown time  . vitamin B-12 100 MCG tablet Take 1 tablet (100 mcg total) by mouth daily. (Patient not taking: No sig reported) 30 tablet 0 Not Taking at Unknown time   Scheduled: . amLODipine  10 mg Oral Daily  . aspirin EC  81 mg Oral Daily  . atorvastatin  40 mg Oral QPM  . [START ON 03/29/2021] COVID-19 mRNA vaccine (Moderna)  0.5 mL Intramuscular ONCE-1600  . folic acid  1 mg Oral Daily  . heparin  5,000 Units Subcutaneous Q8H  . hydrALAZINE  50 mg Oral Q8H  . insulin aspart  0-15 Units Subcutaneous TID WC  . insulin aspart  0-5 Units Subcutaneous QHS  . lisinopril  40 mg Oral QHS  . multivitamin with minerals  1 tablet Oral Daily  . thiamine  100 mg Oral Daily   Or  . thiamine  100 mg Intravenous Daily   Continuous: . sodium chloride 150 mL/hr at 03/28/21 0529  . cefTRIAXone (ROCEPHIN)  IV    . vancomycin     IEP:PIRJJOACZYSAY **OR** acetaminophen, LORazepam **OR** LORazepam, ondansetron **  OR** ondansetron (ZOFRAN) IV, oxyCODONE, polyethylene glycol  No Known Allergies   ROS:  A comprehensive review of systems was negative except for: Constitutional: positive for anorexia, fevers and malaise Musculoskeletal: positive for right great toe swelling and drainage  Blood pressure (!) 181/75, pulse 72, temperature 99 F (37.2 C), temperature source Oral, resp. rate 19, height 6\' 2"  (1.88 m), weight 131.5 kg, SpO2 99 %. Physical Exam Vitals reviewed.  Constitutional:      Appearance: Normal appearance.  HENT:     Head: Normocephalic.     Nose: Nose normal.  Eyes:     Extraocular Movements: Extraocular movements intact.  Cardiovascular:     Rate and Rhythm: Normal rate.     Pulses:          Dorsalis pedis pulses are 1+ on the right side.     Comments: AT on the  right is stronger than DP  Pulmonary:     Effort: Pulmonary effort is normal.  Abdominal:     General: There is no distension.     Palpations: Abdomen is soft.  Musculoskeletal:     Comments: L BKA, R great toe with wet gangrenous changes into the toe and open plantar ucleration with drainage  Skin:    General: Skin is warm.  Neurological:     General: No focal deficit present.     Mental Status: He is alert and oriented to person, place, and time.  Psychiatric:        Mood and Affect: Mood normal.        Behavior: Behavior normal.        Thought Content: Thought content normal.        Judgment: Judgment normal.         Results: Results for orders placed or performed during the hospital encounter of 03/27/21 (from the past 48 hour(s))  CBG monitoring, ED     Status: Abnormal   Collection Time: 03/27/21  4:59 PM  Result Value Ref Range   Glucose-Capillary 169 (H) 70 - 99 mg/dL    Comment: Glucose reference range applies only to samples taken after fasting for at least 8 hours.  Lactic acid, plasma     Status: None   Collection Time: 03/27/21  6:05 PM  Result Value Ref Range   Lactic Acid, Venous 1.5 0.5 - 1.9 mmol/L    Comment: Performed at [facility], [address], [place], [place] [address]  Comprehensive metabolic panel     Status: Abnormal   Collection Time: 03/27/21  6:05 PM  Result Value Ref Range   Sodium 131 (L) 135 - 145 mmol/L   Potassium 3.6 3.5 - 5.1 mmol/L   Chloride 96 (L) 98 - 111 mmol/L   CO2 24 22 - 32 mmol/L   Glucose, Bld 176 (H) 70 - 99 mg/dL    Comment: Glucose reference range applies only to samples taken after fasting for at least 8 hours.   BUN 17 6 - 20 mg/dL   Creatinine, Ser 1.08 0.61 - 1.24 mg/dL   Calcium 8.7 (L) 8.9 - 10.3 mg/dL   Total Protein 8.6 (H) 6.5 - 8.1 g/dL   Albumin 3.1 (L) 3.5 - 5.0 g/dL   AST 67 (H) 15 - 41 U/L   ALT 29 0 - 44 U/L   Alkaline Phosphatase 93 38 - 126 U/L   Total Bilirubin 0.7 0.3 - 1.2 mg/dL   GFR,  Estimated >60 >60 mL/min    Comment: (NOTE) Calculated using  the CKD-EPI Creatinine Equation (2021)    Anion gap 11 5 - 15    Comment: Performed at [facility], [address], [place], [place] [address]  CBC WITH DIFFERENTIAL     Status: Abnormal   Collection Time: 03/27/21  6:05 PM  Result Value Ref Range   WBC 7.6 4.0 - 10.5 K/uL   RBC 4.30 4.22 - 5.81 MIL/uL   Hemoglobin 12.4 (L) 13.0 - 17.0 g/dL   HCT 38.1 (L) 39.0 - 52.0 %   MCV 88.6 80.0 - 100.0 fL   MCH 28.8 26.0 - 34.0 pg   MCHC 32.5 30.0 - 36.0 g/dL   RDW 13.1 11.5 - 15.5 %   Platelets 218 150 - 400 K/uL   nRBC 0.0 0.0 - 0.2 %   Neutrophils Relative % 80 %   Neutro Abs 6.2 1.7 - 7.7 K/uL   Lymphocytes Relative 11 %   Lymphs Abs 0.8 0.7 - 4.0 K/uL   Monocytes Relative 6 %   Monocytes Absolute 0.4 0.1 - 1.0 K/uL   Eosinophils Relative 1 %   Eosinophils Absolute 0.1 0.0 - 0.5 K/uL   Basophils Relative 1 %   Basophils Absolute 0.0 0.0 - 0.1 K/uL   Immature Granulocytes 1 %   Abs Immature Granulocytes 0.07 0.00 - 0.07 K/uL    Comment: Performed at [facility], [address], [place], [place] [address]  Protime-INR     Status: None   Collection Time: 03/27/21  6:05 PM  Result Value Ref Range   Prothrombin Time 14.1 11.4 - 15.2 seconds   INR 1.1 0.8 - 1.2    Comment: (NOTE) INR goal varies based on device and disease states. Performed at [facility], [address] [place], [place] [address]   APTT     Status: None   Collection Time: 03/27/21  6:05 PM  Result Value Ref Range   aPTT 29 24 - 36 seconds    Comment: Performed at [facility], [address], [place],  [address]  Blood Culture (routine x 2)     Status: None (Preliminary result)   Collection Time: 03/27/21  6:05 PM   Specimen: Right Antecubital; Blood  Result Value Ref Range   Specimen Description RIGHT ANTECUBITAL    Special Requests      Blood Culture adequate volume BOTTLES DRAWN AEROBIC AND ANAEROBIC   Culture  Setup Time      GRAM  POSITIVE COCCI Gram Stain Report Called to,Read Back By and Verified With: RACHEAL TATE,RN @1154  03/28/2021 KAY AEROBIC BOTTLE ONLY Performed at [facility], [address], [place],  [address]    Culture PENDING    Report Status PENDING   Resp Panel by RT-PCR (Flu A&B, Covid) Nasopharyngeal Swab     Status: None   Collection Time: 03/27/21  6:07 PM   Specimen: Nasopharyngeal Swab; Nasopharyngeal(NP) swabs in vial transport medium  Result Value Ref Range   SARS Coronavirus 2 by RT PCR NEGATIVE NEGATIVE    Comment: (NOTE) SARS-CoV-2 target nucleic acids are NOT DETECTED.  The SARS-CoV-2 RNA is generally detectable in upper respiratory specimens during the acute phase of infection. The lowest concentration of SARS-CoV-2 viral copies this assay can detect is 138 copies/mL. A negative result does not preclude SARS-Cov-2 infection and should not be used as the sole basis for treatment or other patient management decisions. A negative result may occur with  improper specimen collection/handling, submission of specimen other than nasopharyngeal swab, presence of viral mutation(s)  within the areas targeted by this assay, and inadequate number of viral copies(<138 copies/mL). A negative result must be combined with clinical observations, patient history, and epidemiological information. The expected result is Negative.  Fact Sheet for Patients:  EntrepreneurPulse.com.au  Fact Sheet for Healthcare Providers:  IncredibleEmployment.be  This test is no t yet approved or cleared by the [place] FDA and  has been authorized for detection and/or diagnosis of SARS-CoV-2 by FDA under an Emergency Use Authorization (EUA). This EUA will remain  in effect (meaning this test can be used) for the duration of the COVID-19 declaration under Section 564(b)(1) of the Act, 21 U.S.C.section 360bbb-3(b)(1), unless the authorization is terminated  or revoked  sooner.       Influenza A by PCR NEGATIVE NEGATIVE   Influenza B by PCR NEGATIVE NEGATIVE    Comment: (NOTE) The Xpert Xpress SARS-CoV-2/FLU/RSV plus assay is intended as an aid in the diagnosis of influenza from Nasopharyngeal swab specimens and should not be used as a sole basis for treatment. Nasal washings and aspirates are unacceptable for Xpert Xpress SARS-CoV-2/FLU/RSV testing.  Fact Sheet for Patients: EntrepreneurPulse.com.au  Fact Sheet for Healthcare Providers: IncredibleEmployment.be  This test is not yet approved or cleared by the [place] FDA and has been authorized for detection and/or diagnosis of SARS-CoV-2 by FDA under an Emergency Use Authorization (EUA). This EUA will remain in effect (meaning this test can be used) for the duration of the COVID-19 declaration under Section 564(b)(1) of the Act, 21 U.S.C. section 360bbb-3(b)(1), unless the authorization is terminated or revoked.  Performed at [facility], [address], [place], [place] [address]   Blood Culture (routine x 2)     Status: None (Preliminary result)   Collection Time: 03/27/21  6:20 PM   Specimen: Vein; Blood  Result Value Ref Range   Specimen Description BLOOD LEFT HAND    Special Requests      Blood Culture adequate volume BOTTLES DRAWN AEROBIC AND ANAEROBIC Performed at [facility], [address], [place], [place] [address]    Culture PENDING    Report Status PENDING   Lactic acid, plasma     Status: None   Collection Time: 03/27/21  7:53 PM  Result Value Ref Range   Lactic Acid, Venous 0.8 0.5 - 1.9 mmol/L    Comment: Performed at [facility], [address], [place], [place] [address]  C-reactive protein     Status: Abnormal   Collection Time: 03/27/21  7:53 PM  Result Value Ref Range   CRP 19.7 (H) <1.0 mg/dL    Comment: Performed at [facility], [address] [place], [place] [address]  Sedimentation rate     Status: Abnormal    Collection Time: 03/27/21  7:54 PM  Result Value Ref Range   Sed Rate 108 (H) 0 - 16 mm/hr    Comment: Performed at [facility], [address] [place], [place] [address]  Glucose, capillary     Status: Abnormal   Collection Time: 03/27/21  9:57 PM  Result Value Ref Range   Glucose-Capillary 146 (H) 70 - 99 mg/dL    Comment: Glucose reference range applies only to samples taken after fasting for at least 8 hours.  Magnesium     Status: None   Collection Time: 03/28/21  6:55 AM  Result Value Ref Range   Magnesium 1.8 1.7 - 2.4 mg/dL    Comment: Performed at [facility], [address], [place], [place] [address]  Glucose, capillary  Status: Abnormal   Collection Time: 03/28/21  7:24 AM  Result Value Ref Range   Glucose-Capillary 136 (H) 70 - 99 mg/dL    Comment: Glucose reference range applies only to samples taken after fasting for at least 8 hours.  Glucose, capillary     Status: Abnormal   Collection Time: 03/28/21 11:11 AM  Result Value Ref Range   Glucose-Capillary 242 (H) 70 - 99 mg/dL    Comment: Glucose reference range applies only to samples taken after fasting for at least 8 hours.    DG Chest Port 1 View  Result Date: 03/27/2021 CLINICAL DATA:  Known right foot drainage with first toe ulcer and possible sepsis, initial encounter EXAM: PORTABLE CHEST 1 VIEW COMPARISON:  02/24/2021 FINDINGS: The heart size and mediastinal contours are within normal limits. Both lungs are clear. The visualized skeletal structures are unremarkable. IMPRESSION: No active disease. Electronically Signed   By: Inez Catalina M.D.   On: 03/27/2021 17:45   DG Foot Complete Right  Result Date: 03/27/2021 CLINICAL DATA:  First toe ulcer EXAM: RIGHT FOOT COMPLETE - 3+ VIEW COMPARISON:  09/13/2021 FINDINGS: There is a soft tissue ulcer beneath the head of the first metatarsal. Considerable subcutaneous air is noted consistent with localized infection. No acute fracture is seen. There is a lucent area in  the medial aspect of the first metatarsal head which appears slightly larger than that seen on the prior exam. Possibility of osteomyelitis would deserve consideration. MRI would be more sensitive in this regard. No other bony abnormality is noted. IMPRESSION: Soft tissue ulcer with considerable subcutaneous air consistent with localized infection. There is an area of lucency in the medial aspect of the first metatarsal head which appears slightly larger than that seen on the prior exam. Underlying osteomyelitis would be difficult to exclude. MRI with the flow in this regard as clinically necessary. Electronically Signed   By: Inez Catalina M.D.   On: 03/27/2021 17:49     Assessment & Plan:  Eugene Diaz is a 56 y.o. 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 [name] ordered but not completed. I asked Dr. Jamesetta Geralds to reach out to [name] and [name] agrees to have the patient transferred.   -IV antibiotics  -[name] taking over care  -Wound redressed with saline dampened gauze on wound, dry gauze between toes and Kerlix   All questions were answered to the satisfaction of the patient.   Virl Cagey 03/28/2021, 12:42 PM

## 2021-03-28 NOTE — Progress Notes (Addendum)
PROGRESS NOTE    Patient: Eugene Diaz                            PCP: [name], PA-C                    DOB: 02-17-1966            DOA: 03/27/2021 ZOX:[id number]             DOS: 03/28/2021, 10:54 AM   LOS: 1 day   Date of Service: The patient was seen and examined on 03/28/2021  Subjective:   The patient was seen and examined this morning. Stable at this time. Still complaining of: Continuous right foot wound, draining Otherwise no issues overnight .  Brief Narrative:   Eugene Diaz  is a 55 y.o.m w h/o DM II, PVD, HTN, HLD,  left BKA, hypertension,history of osteomyelitis that resulted in left BKA (1 yr ago), GERD, alcohol abuse, neuropathy, and more presents the ED with a chief complaint of right great toe swelling.   also has had an ulcer on the ball of his right foot > 1 yr. Non healing.   Over the last 2 weeks the ulcer gradually became worse, right great toe gradually became more swollen, right great toe turning blue, malodorous, discharge,   He reports that he stopped drinking alcohol 2 weeks ago.  He did find half a gallon of liquor in his house today, but did not drink it.  He reports no DTs or seizures.    Patient does not smoke, does not use illicit drugs, is not vaccinated for COVID.  Patient is full code.  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 Wet gangrene-osteomyelitis right lower extremity/foot 1. Erythema edema right foot poor pulses, discoloration first second and third toe, foul malodorous discharge from deep ulcer on the bottom of the right foot 2. Xray shows concerns for osteo 3. CRP -19.7, lactic acid 0.8, and ESR elevated to 108 4. MRI right foot ordered >>> 5. [name] ordered 6. Consult to Dr. Constance Haw - rec's [name], continue with antibiotics, possibly amputation in a couple of days 7. Discussed with Dr. Constance Haw, and looks like [name] and his team has been taking care of this patient in the past... Looks like patient may need extensive debridement and amputation 8. Contacted [name] wanting to see the patient upon arrival to [facility]  9. For an [name]-patient may also need vascular consultation 10. Rocephin and Vanc started --- will be continued  2. Diabetic foot ulcer 1. Continue tighter control of blood sugars, wound care and treatment as above 2. Hgb A1C  (06/21/2020 A1c 6.0) 3. Patient reports non compliance with medication  4. Monitors CBGs daily CHS well, with SSI coverage 5. Determining insulin versus p.o. medication for future use at home 3. Mild protein cal mal 1. Encourage nutrient dense food choices 4. EtOH Abuse 1. Recently quit drinking 2. Last drink 2 weeks ago 3. Start CIWA in case of lingering withdrawals (Left arm tremor on exam) 5. HLD 1. Continue statin 2. Stable 6. PVD 1. [name] >>>>  2. Likely will need antiplatelet therapy including aspirin 3. May need vascular consult based on [name] findings  4. Encourage compliant with medication   Addendum:  Bacteremia   We were just notified by LAB that his blood cultures are growing Staph. He is currently stable. Not septic. On IV abx Vanc ID Dr. Baxter Flattery was consulted at [facility]    ----------------------------------------------------------------------------------------------------------------------------------------------- Nutritional status:  The patient's BMI is: Body mass index is 37.23 kg/m. I agree with the assessment and plan as outlined ---------------------------------------------------------------------------------------------------------------------------------------------------- Cultures; 03/27/2021 blood Cultures x 2 >> NGT Pending wound culture >>   Antimicrobials: 03/27/2021 IV antibiotic vancomycin/Rocephin >>   Consultants:  General surgery Dr. Constance Haw   ------------------------------------------------------------------------------------------------------------------------------------------------  DVT prophylaxis:  SCD/Compression stockings and Heparin SQ Code Status:   Code Status: Full Code  Family Communication: Discussed with patient The above findings and plan of care has been discussed with patient (and family)  in detail,  they expressed understanding and agreement of above. -Advance care planning has been discussed.   Admission status:   Status is: Inpatient  Remains inpatient appropriate because:Inpatient level of care appropriate due to severity of illness   Disposition: the patient is from: Home              Anticipated d/c is to: Home              Patient currently is not medically stable to d/c.  Needing aggressive treatment with IV fluids IV antibiotics for  osteomyelitis likely need surgical intervention in next 2 to 3 days   Difficult to place patient No      Level of care: Med-Surg   Procedures:   No admission procedures for hospital encounter.     Antimicrobials:  Anti-infectives (From admission, onward)   Start     Dose/Rate Route Frequency Ordered Stop   03/28/21 1900  cefTRIAXone (ROCEPHIN) 2 g in sodium chloride 0.9 % 100 mL IVPB        2 g 200 mL/hr over 30 Minutes Intravenous Every 24 hours  03/27/21 2129     03/28/21 1800  vancomycin (VANCOREADY) IVPB 2000 mg/400 mL        2,000 mg 200 mL/hr over 120 Minutes Intravenous Every 12 hours 03/28/21 0922     03/28/21 0600  vancomycin (VANCOREADY) IVPB 1250 mg/250 mL  Status:  Discontinued        1,250 mg 166.7 mL/hr over 90 Minutes Intravenous Every 12 hours 03/27/21 2118 03/28/21 0922   03/27/21 1800  vancomycin (VANCOREADY) IVPB 2000 mg/400 mL        2,000 mg 200 mL/hr over 120 Minutes Intravenous  Once 03/27/21 1737 03/27/21 2132   03/27/21 1730  vancomycin (VANCOCIN) IVPB 1000 mg/200 mL premix  Status:  Discontinued        1,000 mg 200 mL/hr over 60 Minutes Intravenous  Once 03/27/21 1720 03/27/21 1737   03/27/21 1730  cefTRIAXone (ROCEPHIN) 2 g in sodium chloride 0.9 % 100 mL IVPB        2 g 200 mL/hr over 30 Minutes Intravenous  Once 03/27/21 1720 03/27/21 2132  Medication:  . atorvastatin  40 mg Oral QPM  . [START ON 03/29/2021] COVID-19 mRNA vaccine (Moderna)  0.5 mL Intramuscular ONCE-1600  . folic acid  1 mg Oral Daily  . heparin  5,000 Units Subcutaneous Q8H  . hydrALAZINE  50 mg Oral Q8H  . insulin aspart  0-15 Units Subcutaneous TID WC  . insulin aspart  0-5 Units Subcutaneous QHS  . lisinopril  40 mg Oral QHS  . multivitamin with minerals  1 tablet Oral Daily  . thiamine  100 mg Oral Daily   Or  . thiamine  100 mg Intravenous Daily    acetaminophen **OR** acetaminophen, LORazepam **OR** LORazepam, ondansetron **OR** ondansetron (ZOFRAN) IV, oxyCODONE, polyethylene glycol   Objective:   Vitals:   03/27/21 2030 03/27/21 2115 03/27/21 2157 03/28/21 0500  BP:  (!) 174/82 (!) 177/78 (!) 181/75  Pulse: 72 74 84 72  Resp: 18 18 19    Temp:   98.3 F (36.8 C) 99 F (37.2 C)  TempSrc:    Oral  SpO2: 98% 100% 100% 99%  Weight:      Height:        Intake/Output Summary (Last 24 hours) at 03/28/2021 1054 Last data filed at 03/28/2021 0900 Gross per 24 hour  Intake 1599.59 ml  Output 1250 ml  Net  349.59 ml   Filed Weights   03/27/21 1656  Weight: 131.5 kg     Examination:   Physical Exam  Constitution:  Alert, cooperative, no distress,  Appears calm and comfortable  Psychiatric: Normal and stable mood and affect, cognition intact,   HEENT: Normocephalic, PERRL, otherwise with in Normal limits  Chest:Chest symmetric Cardio vascular:  S1/S2, RRR, No murmure, No Rubs or Gallops  pulmonary: Clear to auscultation bilaterally, respirations unlabored, negative wheezes / crackles Abdomen: Soft, non-tender, non-distended, bowel sounds,no masses, no organomegaly Muscular skeletal:  Left BKA right foot wound  Limited exam - in bed, able to move all 4 extremities,  Neuro: CNII-XII intact. , normal motor and sensation, reflexes intact  Extremities: No pitting edema lower extremities, +2 pulses, left BKA stump intact,  right lower extremity open wound foul-smelling draining from bottom of the foot, first second and third digit discoloration Skin: Dry, warm to touch, negative for any Rashes,  Wounds: per nursing documentation    ------------------------------------------------------------------------------------------------------------------------------------------    LABs:  CBC Latest Ref Rng & Units 03/27/2021 07/06/2020 07/01/2020  WBC 4.0 - 10.5 K/uL 7.6 4.4 5.9  Hemoglobin 13.0 - 17.0 g/dL 12.4(L) 8.0(L) 8.4(L)  Hematocrit 39.0 - 52.0 % 38.1(L) 26.1(L) 27.7(L)  Platelets 150 - 400 K/uL 218 199 288   CMP Latest Ref Rng & Units 03/27/2021 07/06/2020 07/01/2020  Glucose 70 - 99 mg/dL 176(H) 137(H) 111(H)  BUN 6 - 20 mg/dL 17 17 15   Creatinine 0.61 - 1.24 mg/dL 1.08 1.07 0.98  Sodium 135 - 145 mmol/L 131(L) 135 134(L)  Potassium 3.5 - 5.1 mmol/L 3.6 4.5 4.2  Chloride 98 - 111 mmol/L 96(L) 101 97(L)  CO2 22 - 32 mmol/L 24 26 29   Calcium 8.9 - 10.3 mg/dL 8.7(L) 9.5 9.5  Total Protein 6.5 - 8.1 g/dL 8.6(H) - 7.6  Total Bilirubin 0.3 - 1.2 mg/dL 0.7 - 0.2(L)  Alkaline Phos 38 - 126 U/L 93  - 51  AST 15 - 41 U/L 67(H) - 16  ALT 0 - 44 U/L 29 - 11       Micro Results Recent Results (from the past 240 hour(s))  Blood Culture (routine x 2)  Status: None (Preliminary result)   Collection Time: 03/27/21  6:05 PM   Specimen: Vein; Blood  Result Value Ref Range Status   Specimen Description RIGHT ANTECUBITAL  Final   Special Requests   Final    Blood Culture adequate volume BOTTLES DRAWN AEROBIC AND ANAEROBIC Performed at [facility], [address], [place], [place] [address]    Culture PENDING  Incomplete   Report Status PENDING  Incomplete  Resp Panel by RT-PCR (Flu A&B, Covid) Nasopharyngeal Swab     Status: None   Collection Time: 03/27/21  6:07 PM   Specimen: Nasopharyngeal Swab; Nasopharyngeal(NP) swabs in vial transport medium  Result Value Ref Range Status   SARS Coronavirus 2 by RT PCR NEGATIVE NEGATIVE Final    Comment: (NOTE) SARS-CoV-2 target nucleic acids are NOT DETECTED.  The SARS-CoV-2 RNA is generally detectable in upper respiratory specimens during the acute phase of infection. The lowest concentration of SARS-CoV-2 viral copies this assay can detect is 138 copies/mL. A negative result does not preclude SARS-Cov-2 infection and should not be used as the sole basis for treatment or other patient management decisions. A negative result may occur with  improper specimen collection/handling, submission of specimen other than nasopharyngeal swab, presence of viral mutation(s) within the areas targeted by this assay, and inadequate number of viral copies(<138 copies/mL). A negative result must be combined with clinical observations, patient history, and epidemiological information. The expected result is Negative.  Fact Sheet for Patients:  EntrepreneurPulse.com.au  Fact Sheet for Healthcare Providers:  IncredibleEmployment.be  This test is no t yet approved or cleared by the [place] FDA and  has been  authorized for detection and/or diagnosis of SARS-CoV-2 by FDA under an Emergency Use Authorization (EUA). This EUA will remain  in effect (meaning this test can be used) for the duration of the COVID-19 declaration under Section 564(b)(1) of the Act, 21 U.S.C.section 360bbb-3(b)(1), unless the authorization is terminated  or revoked sooner.       Influenza A by PCR NEGATIVE NEGATIVE Final   Influenza B by PCR NEGATIVE NEGATIVE Final    Comment: (NOTE) The Xpert Xpress SARS-CoV-2/FLU/RSV plus assay is intended as an aid in the diagnosis of influenza from Nasopharyngeal swab specimens and should not be used as a sole basis for treatment. Nasal washings and aspirates are unacceptable for Xpert Xpress SARS-CoV-2/FLU/RSV testing.  Fact Sheet for Patients: EntrepreneurPulse.com.au  Fact Sheet for Healthcare Providers: IncredibleEmployment.be  This test is not yet approved or cleared by the [place] FDA and has been authorized for detection and/or diagnosis of SARS-CoV-2 by FDA under an Emergency Use Authorization (EUA). This EUA will remain in effect (meaning this test can be used) for the duration of the COVID-19 declaration under Section 564(b)(1) of the Act, 21 U.S.C. section 360bbb-3(b)(1), unless the authorization is terminated or revoked.  Performed at [facility], [address] [place], [place] [address]   Blood Culture (routine x 2)     Status: None (Preliminary result)   Collection Time: 03/27/21  6:20 PM   Specimen: Vein; Blood  Result Value Ref Range Status   Specimen Description BLOOD LEFT HAND  Final   Special Requests   Final    Blood Culture adequate volume BOTTLES DRAWN AEROBIC AND ANAEROBIC Performed at [facility], [address], [place], [place] [address]    Culture PENDING  Incomplete   Report Status PENDING  Incomplete    Radiology Reports DG Chest Port 1 View  Result Date: 03/27/2021 CLINICAL  DATA:  Known  right foot drainage with first toe ulcer and possible sepsis, initial encounter EXAM: PORTABLE CHEST 1 VIEW COMPARISON:  02/24/2021 FINDINGS: The heart size and mediastinal contours are within normal limits. Both lungs are clear. The visualized skeletal structures are unremarkable. IMPRESSION: No active disease. Electronically Signed   By: Inez Catalina M.D.   On: 03/27/2021 17:45   DG Foot Complete Right  Result Date: 03/27/2021 CLINICAL DATA:  First toe ulcer EXAM: RIGHT FOOT COMPLETE - 3+ VIEW COMPARISON:  09/13/2021 FINDINGS: There is a soft tissue ulcer beneath the head of the first metatarsal. Considerable subcutaneous air is noted consistent with localized infection. No acute fracture is seen. There is a lucent area in the medial aspect of the first metatarsal head which appears slightly larger than that seen on the prior exam. Possibility of osteomyelitis would deserve consideration. MRI would be more sensitive in this regard. No other bony abnormality is noted. IMPRESSION: Soft tissue ulcer with considerable subcutaneous air consistent with localized infection. There is an area of lucency in the medial aspect of the first metatarsal head which appears slightly larger than that seen on the prior exam. Underlying osteomyelitis would be difficult to exclude. MRI with the flow in this regard as clinically necessary. Electronically Signed   By: Inez Catalina M.D.   On: 03/27/2021 17:49    SIGNED: Deatra James, MD, FHM. [facility],  Pager (please use [email or web address] to page/text) Please use Epic Secure Chat for non-urgent communication (7AM-7PM)  If 7PM-7AM, please contact night-coverage [email or web address], 03/28/2021, 10:54 AM

## 2021-03-29 ENCOUNTER — Inpatient Hospital Stay (HOSPITAL_COMMUNITY): Payer: Medicaid Other

## 2021-03-29 ENCOUNTER — Encounter (HOSPITAL_COMMUNITY)
Admission: EM | Disposition: A | Discharge status: Home / Self Care | Payer: Self-pay | Source: Home / Self Care | Attending: Student

## 2021-03-29 ENCOUNTER — Inpatient Hospital Stay (HOSPITAL_COMMUNITY): Payer: Medicaid Other | Admitting: Anesthesiology

## 2021-03-29 DIAGNOSIS — M86171 Other acute osteomyelitis, right ankle and foot: Secondary | ICD-10-CM

## 2021-03-29 DIAGNOSIS — F32A Depression, unspecified: Secondary | ICD-10-CM

## 2021-03-29 DIAGNOSIS — E1169 Type 2 diabetes mellitus with other specified complication: Principal | ICD-10-CM

## 2021-03-29 DIAGNOSIS — E11621 Type 2 diabetes mellitus with foot ulcer: Secondary | ICD-10-CM | POA: Diagnosis not present

## 2021-03-29 DIAGNOSIS — E1165 Type 2 diabetes mellitus with hyperglycemia: Secondary | ICD-10-CM

## 2021-03-29 DIAGNOSIS — M869 Osteomyelitis, unspecified: Secondary | ICD-10-CM | POA: Diagnosis not present

## 2021-03-29 DIAGNOSIS — F419 Anxiety disorder, unspecified: Secondary | ICD-10-CM

## 2021-03-29 DIAGNOSIS — R7881 Bacteremia: Secondary | ICD-10-CM

## 2021-03-29 DIAGNOSIS — I10 Essential (primary) hypertension: Secondary | ICD-10-CM

## 2021-03-29 DIAGNOSIS — M86161 Other acute osteomyelitis, right tibia and fibula: Secondary | ICD-10-CM

## 2021-03-29 DIAGNOSIS — G621 Alcoholic polyneuropathy: Secondary | ICD-10-CM | POA: Diagnosis not present

## 2021-03-29 DIAGNOSIS — E1152 Type 2 diabetes mellitus with diabetic peripheral angiopathy with gangrene: Secondary | ICD-10-CM | POA: Diagnosis not present

## 2021-03-29 HISTORY — PX: I & D EXTREMITY: SHX5045

## 2021-03-29 HISTORY — PX: AMPUTATION: SHX166

## 2021-03-29 LAB — GLUCOSE, CAPILLARY
Glucose-Capillary: 140 mg/dL — ABNORMAL HIGH (ref 70–99)
Glucose-Capillary: 151 mg/dL — ABNORMAL HIGH (ref 70–99)
Glucose-Capillary: 126 mg/dL — ABNORMAL HIGH (ref 70–99)
Glucose-Capillary: 128 mg/dL — ABNORMAL HIGH (ref 70–99)
Glucose-Capillary: 186 mg/dL — ABNORMAL HIGH (ref 70–99)

## 2021-03-29 LAB — CBC
HCT: 28.3 % — ABNORMAL LOW (ref 39.0–52.0)
HCT: 28.5 % — ABNORMAL LOW (ref 39.0–52.0)
Hemoglobin: 9.6 g/dL — ABNORMAL LOW (ref 13.0–17.0)
Hemoglobin: 9.7 g/dL — ABNORMAL LOW (ref 13.0–17.0)
MCH: 29.3 pg (ref 26.0–34.0)
MCH: 29.5 pg (ref 26.0–34.0)
MCHC: 33.9 g/dL (ref 30.0–36.0)
MCHC: 34 g/dL (ref 30.0–36.0)
MCV: 86.3 fL (ref 80.0–100.0)
MCV: 86.6 fL (ref 80.0–100.0)
Platelets: 232 10*3/uL (ref 150–400)
Platelets: 254 10*3/uL (ref 150–400)
RBC: 3.28 MIL/uL — ABNORMAL LOW (ref 4.22–5.81)
RBC: 3.29 MIL/uL — ABNORMAL LOW (ref 4.22–5.81)
RDW: 12.9 % (ref 11.5–15.5)
RDW: 12.9 % (ref 11.5–15.5)
WBC: 7.1 10*3/uL (ref 4.0–10.5)
WBC: 9 10*3/uL (ref 4.0–10.5)
nRBC: 0 % (ref 0.0–0.2)
nRBC: 0 % (ref 0.0–0.2)

## 2021-03-29 LAB — URINALYSIS, ROUTINE W REFLEX MICROSCOPIC
Bacteria, UA: NONE SEEN
Bilirubin Urine: NEGATIVE
Glucose, UA: NEGATIVE mg/dL
Ketones, ur: NEGATIVE mg/dL
Leukocytes,Ua: NEGATIVE
Nitrite: NEGATIVE
Protein, ur: NEGATIVE mg/dL
Specific Gravity, Urine: 1.01 (ref 1.005–1.030)
pH: 6 (ref 5.0–8.0)

## 2021-03-29 LAB — RENAL FUNCTION PANEL
Albumin: 2.2 g/dL — ABNORMAL LOW (ref 3.5–5.0)
Anion gap: 5 (ref 5–15)
BUN: 11 mg/dL (ref 6–20)
CO2: 22 mmol/L (ref 22–32)
Calcium: 8.3 mg/dL — ABNORMAL LOW (ref 8.9–10.3)
Chloride: 107 mmol/L (ref 98–111)
Creatinine, Ser: 0.75 mg/dL (ref 0.61–1.24)
GFR, Estimated: >60 mL/min (ref >60)
Glucose, Bld: 145 mg/dL — ABNORMAL HIGH (ref 70–99)
Phosphorus: 2.9 mg/dL (ref 2.5–4.6)
Potassium: 3.1 mmol/L — ABNORMAL LOW (ref 3.5–5.1)
Sodium: 134 mmol/L — ABNORMAL LOW (ref 135–145)

## 2021-03-29 LAB — COMPREHENSIVE METABOLIC PANEL
ALT: 30 U/L (ref 0–44)
AST: 49 U/L — ABNORMAL HIGH (ref 15–41)
Albumin: 2.1 g/dL — ABNORMAL LOW (ref 3.5–5.0)
Alkaline Phosphatase: 71 U/L (ref 38–126)
Anion gap: 9 (ref 5–15)
BUN: 13 mg/dL (ref 6–20)
CO2: 21 mmol/L — ABNORMAL LOW (ref 22–32)
Calcium: 8.1 mg/dL — ABNORMAL LOW (ref 8.9–10.3)
Chloride: 104 mmol/L (ref 98–111)
Creatinine, Ser: 0.85 mg/dL (ref 0.61–1.24)
GFR, Estimated: >60 mL/min (ref >60)
Glucose, Bld: 129 mg/dL — ABNORMAL HIGH (ref 70–99)
Potassium: 2.8 mmol/L — ABNORMAL LOW (ref 3.5–5.1)
Sodium: 134 mmol/L — ABNORMAL LOW (ref 135–145)
Total Bilirubin: 0.3 mg/dL (ref 0.3–1.2)
Total Protein: 6.1 g/dL — ABNORMAL LOW (ref 6.5–8.1)

## 2021-03-29 LAB — VANCOMYCIN, RANDOM: Vancomycin Rm: 23

## 2021-03-29 LAB — MAGNESIUM: Magnesium: 1.9 mg/dL (ref 1.7–2.4)

## 2021-03-29 SURGERY — AMPUTATION, FOOT, RAY
Anesthesia: General | Laterality: Right

## 2021-03-29 MED ORDER — VANCOMYCIN HCL 1000 MG IV SOLR
INTRAVENOUS | Status: DC | PRN
  Administered 2021-03-29: 1000 mg

## 2021-03-29 MED ORDER — FENTANYL CITRATE (PF) 250 MCG/5ML IJ SOLN
INTRAMUSCULAR | Status: AC
  Filled 2021-03-29: qty 5

## 2021-03-29 MED ORDER — SUCCINYLCHOLINE CHLORIDE 200 MG/10ML IV SOSY
PREFILLED_SYRINGE | INTRAVENOUS | Status: AC
  Filled 2021-03-29: qty 10

## 2021-03-29 MED ORDER — VANCOMYCIN HCL 1000 MG IV SOLR
INTRAVENOUS | Status: AC
  Filled 2021-03-29: qty 1000

## 2021-03-29 MED ORDER — VANCOMYCIN HCL 1250 MG/250ML IV SOLN
1250.0000 mg | Freq: Two times a day (BID) | INTRAVENOUS | Status: DC
  Administered 2021-03-29 – 2021-03-30 (×2): 1250 mg via INTRAVENOUS
  Filled 2021-03-29 (×3): qty 250

## 2021-03-29 MED ORDER — GADOBUTROL 1 MMOL/ML IV SOLN
10.0000 mL | Freq: Once | INTRAVENOUS | Status: AC | PRN
  Administered 2021-03-29: 10 mL via INTRAVENOUS

## 2021-03-29 MED ORDER — HYDRALAZINE HCL 50 MG PO TABS
75.0000 mg | ORAL_TABLET | Freq: Three times a day (TID) | ORAL | Status: DC
  Administered 2021-03-29 – 2021-04-01 (×9): 75 mg via ORAL
  Filled 2021-03-29 (×9): qty 1

## 2021-03-29 MED ORDER — ROCURONIUM BROMIDE 10 MG/ML (PF) SYRINGE
PREFILLED_SYRINGE | INTRAVENOUS | Status: DC | PRN
  Administered 2021-03-29: 60 mg via INTRAVENOUS

## 2021-03-29 MED ORDER — BUPIVACAINE HCL (PF) 0.5 % IJ SOLN
INTRAMUSCULAR | Status: DC | PRN
  Administered 2021-03-29: 10 mL

## 2021-03-29 MED ORDER — HYDRALAZINE HCL 25 MG PO TABS: 25.0000 mg | ORAL_TABLET | Freq: Four times a day (QID) | ORAL | Status: DC | PRN

## 2021-03-29 MED ORDER — SUGAMMADEX SODIUM 200 MG/2ML IV SOLN
INTRAVENOUS | Status: DC | PRN
  Administered 2021-03-29: 400 mg via INTRAVENOUS

## 2021-03-29 MED ORDER — LACTATED RINGERS IV SOLN: INTRAVENOUS | Status: DC | PRN

## 2021-03-29 MED ORDER — LIDOCAINE 2% (20 MG/ML) 5 ML SYRINGE
INTRAMUSCULAR | Status: AC
  Filled 2021-03-29: qty 5

## 2021-03-29 MED ORDER — VANCOMYCIN VARIABLE DOSE PER UNSTABLE RENAL FUNCTION (PHARMACIST DOSING): Status: DC

## 2021-03-29 MED ORDER — SUCCINYLCHOLINE CHLORIDE 200 MG/10ML IV SOSY
PREFILLED_SYRINGE | INTRAVENOUS | Status: DC | PRN
  Administered 2021-03-29: 120 mg via INTRAVENOUS

## 2021-03-29 MED ORDER — ROCURONIUM BROMIDE 10 MG/ML (PF) SYRINGE
PREFILLED_SYRINGE | INTRAVENOUS | Status: AC
  Filled 2021-03-29: qty 10

## 2021-03-29 MED ORDER — 0.9 % SODIUM CHLORIDE (POUR BTL) OPTIME
TOPICAL | Status: DC | PRN
  Administered 2021-03-29: 1000 mL

## 2021-03-29 MED ORDER — MIDAZOLAM HCL 2 MG/2ML IJ SOLN
INTRAMUSCULAR | Status: AC
  Filled 2021-03-29: qty 2

## 2021-03-29 MED ORDER — PROPOFOL 10 MG/ML IV BOLUS
INTRAVENOUS | Status: DC | PRN
  Administered 2021-03-29: 50 mg via INTRAVENOUS
  Administered 2021-03-29: 150 mg via INTRAVENOUS

## 2021-03-29 MED ORDER — HEMOSTATIC AGENTS (NO CHARGE) OPTIME
TOPICAL | Status: DC | PRN
  Administered 2021-03-29: 1

## 2021-03-29 MED ORDER — PROPOFOL 10 MG/ML IV BOLUS
INTRAVENOUS | Status: AC
  Filled 2021-03-29: qty 20

## 2021-03-29 MED ORDER — SODIUM CHLORIDE 0.9 % IV SOLN
2.0000 g | Freq: Three times a day (TID) | INTRAVENOUS | Status: DC
  Administered 2021-03-29 – 2021-03-31 (×7): 2 g via INTRAVENOUS
  Filled 2021-03-29 (×7): qty 2

## 2021-03-29 MED ORDER — FENTANYL CITRATE (PF) 250 MCG/5ML IJ SOLN
INTRAMUSCULAR | Status: DC | PRN
  Administered 2021-03-29: 50 ug via INTRAVENOUS
  Administered 2021-03-29: 100 ug via INTRAVENOUS

## 2021-03-29 MED ORDER — ONDANSETRON HCL 4 MG/2ML IJ SOLN
INTRAMUSCULAR | Status: DC | PRN
  Administered 2021-03-29: 4 mg via INTRAVENOUS

## 2021-03-29 MED ORDER — POTASSIUM CHLORIDE CRYS ER 20 MEQ PO TBCR
40.0000 meq | EXTENDED_RELEASE_TABLET | ORAL | Status: AC
  Administered 2021-03-29 – 2021-03-30 (×2): 40 meq via ORAL
  Filled 2021-03-29 (×2): qty 2

## 2021-03-29 MED ORDER — LIDOCAINE 2% (20 MG/ML) 5 ML SYRINGE
INTRAMUSCULAR | Status: DC | PRN
  Administered 2021-03-29: 100 mg via INTRAVENOUS

## 2021-03-29 MED ORDER — SODIUM CHLORIDE 0.9 % IR SOLN
Status: DC | PRN
  Administered 2021-03-29: 3000 mL

## 2021-03-29 MED ORDER — METRONIDAZOLE 500 MG/100ML IV SOLN
500.0000 mg | Freq: Three times a day (TID) | INTRAVENOUS | Status: DC
  Administered 2021-03-29 – 2021-03-31 (×7): 500 mg via INTRAVENOUS
  Filled 2021-03-29 (×7): qty 100

## 2021-03-29 MED ORDER — BUPIVACAINE HCL (PF) 0.5 % IJ SOLN
INTRAMUSCULAR | Status: AC
  Filled 2021-03-29: qty 30

## 2021-03-29 SURGICAL SUPPLY — 48 items
APL PRP STRL LF DISP 70% ISPRP (MISCELLANEOUS) ×1
BLADE LONG MED 31MMX9MM (MISCELLANEOUS) ×1
BLADE LONG MED 31X9 (MISCELLANEOUS) ×2 IMPLANT
BNDG CMPR 9X4 STRL LF SNTH (GAUZE/BANDAGES/DRESSINGS)
BNDG ELASTIC 4X5.8 VLCR STR LF (GAUZE/BANDAGES/DRESSINGS) ×3 IMPLANT
BNDG ESMARK 4X9 LF (GAUZE/BANDAGES/DRESSINGS) IMPLANT
BNDG GAUZE ELAST 4 BULKY (GAUZE/BANDAGES/DRESSINGS) ×3 IMPLANT
CHLORAPREP W/TINT 26 (MISCELLANEOUS) ×3 IMPLANT
COVER SURGICAL LIGHT HANDLE (MISCELLANEOUS) ×3 IMPLANT
COVER WAND RF STERILE (DRAPES) ×3 IMPLANT
CUFF TOURN SGL QUICK 18X4 (TOURNIQUET CUFF) IMPLANT
CUFF TOURN SGL QUICK 34 (TOURNIQUET CUFF)
CUFF TRNQT CYL 34X4.125X (TOURNIQUET CUFF) IMPLANT
DRAPE U-SHAPE 47X51 STRL (DRAPES) ×3 IMPLANT
DRSG EMULSION OIL 3X3 NADH (GAUZE/BANDAGES/DRESSINGS) ×3 IMPLANT
DRSG PAD ABDOMINAL 8X10 ST (GAUZE/BANDAGES/DRESSINGS) ×2 IMPLANT
ELECT CAUTERY BLADE 6.4 (BLADE) ×3 IMPLANT
ELECT REM PT RETURN 9FT ADLT (ELECTROSURGICAL) ×3
ELECTRODE REM PT RTRN 9FT ADLT (ELECTROSURGICAL) ×1 IMPLANT
GAUZE SPONGE 4X4 12PLY STRL (GAUZE/BANDAGES/DRESSINGS) ×3 IMPLANT
GLOVE BIO SURGEON STRL SZ7.5 (GLOVE) ×3 IMPLANT
GLOVE SRG 8 PF TXTR STRL LF DI (GLOVE) ×1 IMPLANT
GLOVE SURG UNDER POLY LF SZ8 (GLOVE) ×3
GOWN STRL REUS W/ TWL LRG LVL3 (GOWN DISPOSABLE) ×1 IMPLANT
GOWN STRL REUS W/ TWL XL LVL3 (GOWN DISPOSABLE) ×1 IMPLANT
GOWN STRL REUS W/TWL LRG LVL3 (GOWN DISPOSABLE) ×3
GOWN STRL REUS W/TWL XL LVL3 (GOWN DISPOSABLE) ×3
HEMOSTAT SURGICEL 2X4 FIBR (HEMOSTASIS) ×2 IMPLANT
KIT BASIN OR (CUSTOM PROCEDURE TRAY) ×3 IMPLANT
KIT TURNOVER KIT B (KITS) ×3 IMPLANT
MANIFOLD NEPTUNE II (INSTRUMENTS) ×3 IMPLANT
NDL BIOPSY JAMSHIDI 8X6 (NEEDLE) IMPLANT
NDL HYPO 25GX1X1/2 BEV (NEEDLE) ×1 IMPLANT
NEEDLE BIOPSY JAMSHIDI 8X6 (NEEDLE) IMPLANT
NEEDLE HYPO 25GX1X1/2 BEV (NEEDLE) ×3 IMPLANT
NS IRRIG 1000ML POUR BTL (IV SOLUTION) ×3 IMPLANT
PACK ORTHO EXTREMITY (CUSTOM PROCEDURE TRAY) ×3 IMPLANT
PAD ARMBOARD 7.5X6 YLW CONV (MISCELLANEOUS) ×6 IMPLANT
PROBE DEBRIDE SONICVAC MISONIX (TIP) IMPLANT
SET CYSTO W/LG BORE CLAMP LF (SET/KITS/TRAYS/PACK) ×3 IMPLANT
SOL PREP POV-IOD 4OZ 10% (MISCELLANEOUS) ×6 IMPLANT
SUT ETHILON 3 0 PS 1 (SUTURE) ×3 IMPLANT
SYR CONTROL 10ML LL (SYRINGE) ×3 IMPLANT
TOWEL GREEN STERILE (TOWEL DISPOSABLE) ×3 IMPLANT
TOWEL GREEN STERILE FF (TOWEL DISPOSABLE) ×3 IMPLANT
TUBE CONNECTING 12'X1/4 (SUCTIONS) ×1
TUBE CONNECTING 12X1/4 (SUCTIONS) ×2 IMPLANT
YANKAUER SUCT BULB TIP NO VENT (SUCTIONS) ×3 IMPLANT

## 2021-03-29 NOTE — Plan of Care (Signed)
  Problem: Education: Goal: Knowledge of General Education information will improve Description Including pain rating scale, medication(s)/side effects and non-pharmacologic comfort measures Outcome: Progressing   

## 2021-03-29 NOTE — Anesthesia Preprocedure Evaluation (Addendum)
Anesthesia Evaluation  Patient identified by MRN, date of birth, ID band Patient awake    Reviewed: Allergy & Precautions, NPO status , Patient's Chart, lab work & pertinent test results  Airway Mallampati: II  TM Distance: >3 FB Neck ROM: Full    Dental  (+) Dental Advisory Given, Missing, Chipped, Poor Dentition   Pulmonary asthma , former smoker,    Pulmonary exam normal breath sounds clear to auscultation       Cardiovascular hypertension, Pt. on medications + Peripheral Vascular Disease  Normal cardiovascular exam Rhythm:Regular Rate:Normal     Neuro/Psych PSYCHIATRIC DISORDERS Anxiety Depression Essential tremor  Neuromuscular disease    GI/Hepatic Neg liver ROS, PUD, GERD  Medicated,  Endo/Other  diabetes, Type 2, Oral Hypoglycemic AgentsObesity   Renal/GU negative Renal ROS     Musculoskeletal  (+) Arthritis , osteomyelitis and septic joint   Abdominal   Peds  Hematology  (+) Blood dyscrasia, anemia ,   Anesthesia Other Findings   Reproductive/Obstetrics                            Anesthesia Physical Anesthesia Plan  ASA: III and emergent  Anesthesia Plan: General   Post-op Pain Management:    Induction: Intravenous  PONV Risk Score and Plan: 2 and Midazolam, Dexamethasone and Ondansetron  Airway Management Planned: Oral ETT  Additional Equipment:   Intra-op Plan:   Post-operative Plan: Extubation in OR  Informed Consent: I have reviewed the patients History and Physical, chart, labs and discussed the procedure including the risks, benefits and alternatives for the proposed anesthesia with the patient or authorized representative who has indicated his/her understanding and acceptance.     Dental advisory given  Plan Discussed with: CRNA  Anesthesia Plan Comments:        Anesthesia Quick Evaluation

## 2021-03-29 NOTE — Consult Note (Signed)
[facility]ious Disease  Total days of antibiotics 3/vanco/cefepime/metro               Reason for Consult:wet gangrene to right great toe   Referring Physician: Cyndia Skeeters  Principal Problem:   Osteomyelitis due to type 2 diabetes mellitus ([place]) Active Problems:   Hyperlipidemia associated with type 2 diabetes mellitus (HCC)   ETOH abuse   Alcoholic peripheral neuropathy (HCC)   Diabetic neuropathy (HCC)   Diabetic foot ulcer ([place])   Diabetic wet gangrene of the foot ([place])    HPI: Eugene Diaz is a 55 y.o. male with T2DM with peripheral neuropathy, hx of MSSA & candida left foot osteo s/p L BKA, right great toe DFU that has continued over the last 6 months to evolve to wet gangrene. Hx of ETOH dependence with peripheral neuropathy. Grieving loss of wife 2 yrs ago. He was admitted to [facility] on 5/28 found to have wet gangrene/osteo of right great toe. Started empirically on vanco/cefepime/metronidazole. Consented for amputation. Now transferred to [facility] for surgery. Sed rate of 108; CRP 19.7, infectious work up showed 1 of 4 bottles with staph hominis.   Underwent MRI 5/30:  1. Large soft tissue wound along the plantar lateral aspect of the first MTP joint with severe cellulitis of the great toe extending into the dorsal aspect of the foot and soft tissue emphysema within the great toe. Abscess within the subcutaneous fat along the medial aspect of the first MTP joint measuring approximately 4.2 x 1 x 3.4 cm. Complex peripherally enhancing fluid collection in the flexor hallucis brevis muscle measuring 1.6 x 1 x 2.9 cm most consistent with an abscess. 2. Septic arthritis of the first MTP joint with osteomyelitis of the first metatarsal head, first proximal phalanx and medial hallux sesamoid. 3. Diffuse T2 hyperintense signal throughout the plantar musculature concerning for myositis.   Past Medical History:  Diagnosis Date  . Alcohol abuse   . Alcoholic peripheral  neuropathy ([place])   . Anxiety   . Arthritis   . Asthma    followed by pcp  . B12 deficiency   . Depression   . Diabetic neuropathy ([place])   . Edema of both lower extremities   . Essential tremor    neurologist-- dr patel--- due to alcohol abuse  . GERD (gastroesophageal reflux disease)   . History of cellulitis 2020   left lower leg;   recurrent 03-25-2020  . History of esophageal stricture    s/p  dilatation 02-13-2018  . History of osteomyelitis 2020   left great toe    . Hypercholesterolemia   . Hypertension    followed by pcp  (nuclear stress test 03-11-2014 low risk w/ no ischemia, ef 65%  . Normocytic anemia    followed by pcp   (03-18-2020 had transfusion's 02/ 2021)  . Peripheral vascular disease ([place])   . 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 Thrombocytopenia (HCC)    chronic  . Type 2 diabetes mellitus ([place])    followed by pcp---    (03-18-2020  pt stated does not check blood sugar)    Allergies: No Known Allergies  MEDICATIONS: . amLODipine  10 mg Oral Daily  . aspirin EC  81 mg Oral Daily  . atorvastatin  40 mg Oral QPM  . COVID-19 mRNA vaccine (Moderna)  0.5 mL Intramuscular ONCE-1600  . folic acid  1 mg Oral Daily  . heparin  5,000 Units Subcutaneous Q8H  . hydrALAZINE  75 mg Oral Q8H  . insulin aspart  0-15 Units Subcutaneous TID WC  . insulin aspart  0-5 Units Subcutaneous QHS  . lisinopril  40 mg Oral QHS  . multivitamin with minerals  1 tablet Oral Daily  . thiamine  100 mg Oral Daily   Or  . thiamine  100 mg Intravenous Daily  . vancomycin variable dose per unstable renal function (pharmacist dosing)   Does not apply See admin instructions    Social History   Tobacco Use  .  Smoking status: Former Smoker    Packs/day: 2.50    Years: 25.00    Pack years: 62.50    Types: Cigarettes    Quit date: 08/05/1984    Years since quitting: 36.6  . Smokeless tobacco: Never Used  Vaping Use  . Vaping Use: Never used  Substance Use Topics  . Alcohol use: Yes    Alcohol/week: 50.0 standard drinks    Types: 50 Standard drinks or equivalent per week    Comment: Drinks 6 beers, liquor 4-6oz (both daily) x 35 years  . Drug use: Not Currently    Types: Marijuana    Family History  Problem Relation Age of Onset  . Heart attack Mother        Living, 34  . Cancer Father        Deceased  . Healthy Brother   . Healthy Sister   . Colon cancer Neg Hx   . Colon polyps Neg Hx      Review of Systems  Constitutional: Negative for fever, chills, diaphoresis, activity change, appetite change, fatigue and unexpected weight change.  HENT: Negative for congestion, sore throat, rhinorrhea, sneezing, trouble swallowing and sinus pressure.  Eyes: Negative for photophobia and visual disturbance.  Respiratory: Negative for cough, chest tightness, shortness of breath, wheezing and stridor.  Cardiovascular: Negative for chest pain, palpitations and leg swelling.  Gastrointestinal: Negative for nausea, vomiting, abdominal pain, diarrhea, constipation, blood in stool, abdominal distention and anal bleeding.  Genitourinary: Negative for dysuria, hematuria, flank pain and difficulty urinating.  Musculoskeletal: Negative for myalgias, back pain, joint swelling, arthralgias and gait problem.  Skin: +right foot wound. Negative for color change, pallor, rash and wound.  Neurological: Negative for dizziness, tremors, weakness and light-headedness.  Hematological: Negative for adenopathy. Does not bruise/bleed easily.  Psychiatric/Behavioral: Negative for behavioral problems, confusion, sleep disturbance, dysphoric mood, decreased concentration and agitation.     OBJECTIVE: Temp:  [98 F  (36.7 C)-99.1 F (37.3 C)] 98 F (36.7 C) (05/30 0952) Pulse Rate:  [68-76] 68 (05/30 0952) Resp:  [16-17] 17 (05/30 0952) BP: (155-180)/(66-83) 167/76 (05/30 0952) SpO2:  [97 %-100 %] 99 % (05/30 9373) Physical Exam  Constitutional: He is oriented to person, place, and time. He appears well-developed and well-nourished. No distress.  HENT:  Mouth/Throat: Oropharynx is clear and moist. No oropharyngeal exudate.  Cardiovascular: Normal rate, regular rhythm and normal heart sounds. Exam reveals no gallop and no friction rub.  No murmur heard.  Pulmonary/Chest: Effort normal and breath sounds normal. No respiratory distress. He has no wheezes.  Abdominal: Soft. Bowel sounds are normal. He exhibits no distension. There is no tenderness.  Lymphadenopathy:  He has no cervical adenopathy.  Neurological: He is alert and oriented to person, place, and time.  Skin: wet grangrenous appearing right great toe, black/blue with bullae on medial aspect Psychiatric: He has a normal mood and affect. His behavior is normal.     LABS: Results for orders placed or performed during the hospital encounter of 03/27/21 (from the past 48 hour(s))  CBG monitoring, ED     Status: Abnormal   Collection Time: 03/27/21  4:59 PM  Result Value Ref Range   Glucose-Capillary 169 (H) 70 - 99 mg/dL    Comment: Glucose reference range applies only to samples taken after fasting for at least 8 hours.  Lactic acid, plasma     Status: None   Collection Time: 03/27/21  6:05 PM  Result Value Ref Range   Lactic Acid, Venous 1.5 0.5 - 1.9 mmol/L    Comment: Performed at [facility], [address], [place], [place] [address]  Comprehensive metabolic panel     Status: Abnormal   Collection Time: 03/27/21  6:05 PM  Result Value Ref Range   Sodium 131 (L) 135 - 145 mmol/L   Potassium 3.6 3.5 - 5.1 mmol/L   Chloride 96 (L) 98 - 111 mmol/L   CO2 24 22 - 32 mmol/L   Glucose, Bld 176 (H) 70 - 99 mg/dL    Comment: Glucose  reference range applies only to samples taken after fasting for at least 8 hours.   BUN 17 6 - 20 mg/dL   Creatinine, Ser 1.08 0.61 - 1.24 mg/dL   Calcium 8.7 (L) 8.9 - 10.3 mg/dL   Total Protein 8.6 (H) 6.5 - 8.1 g/dL   Albumin 3.1 (L) 3.5 - 5.0 g/dL   AST 67 (H) 15 - 41 U/L   ALT 29 0 - 44 U/L   Alkaline Phosphatase 93 38 - 126 U/L   Total Bilirubin 0.7 0.3 - 1.2 mg/dL   GFR, Estimated >60 >60 mL/min    Comment: (NOTE) Calculated using the CKD-EPI Creatinine Equation (2021)    Anion gap 11 5 - 15    Comment: Performed at [facility], [address], [place], [place] [address]  CBC WITH DIFFERENTIAL     Status: Abnormal   Collection Time: 03/27/21  6:05 PM  Result Value Ref Range   WBC 7.6 4.0 - 10.5 K/uL   RBC 4.30 4.22 - 5.81 MIL/uL   Hemoglobin 12.4 (L) 13.0 - 17.0 g/dL   HCT 38.1 (L) 39.0 - 52.0 %   MCV 88.6 80.0 - 100.0 fL   MCH 28.8 26.0 - 34.0 pg   MCHC 32.5 30.0 - 36.0 g/dL   RDW 13.1 11.5 - 15.5 %   Platelets 218 150 - 400 K/uL   nRBC 0.0 0.0 - 0.2 %   Neutrophils Relative % 80 %   Neutro Abs 6.2 1.7 - 7.7 K/uL   Lymphocytes Relative 11 %   Lymphs Abs 0.8 0.7 - 4.0 K/uL   Monocytes Relative 6 %   Monocytes Absolute 0.4 0.1 - 1.0 K/uL   Eosinophils Relative 1 %   Eosinophils Absolute 0.1 0.0 - 0.5 K/uL   Basophils Relative 1 %   Basophils Absolute 0.0 0.0 - 0.1 K/uL   Immature Granulocytes 1 %   Abs Immature Granulocytes 0.07 0.00 - 0.07 K/uL    Comment: Performed at [facility], [address] [place], [place] [address]  Protime-INR     Status: None   Collection Time: 03/27/21  6:05 PM  Result Value Ref Range   Prothrombin Time 14.1 11.4 - 15.2 seconds   INR 1.1 0.8 - 1.2    Comment: (NOTE) INR goal varies based on device and disease states. Performed at [facility], [address], [place], [place] [address]   APTT     Status: None   Collection Time: 03/27/21  6:05 PM  Result Value Ref Range   aPTT 29 24 - 36 seconds    Comment: Performed at [facility], [address], [place], [place] [address]  Blood Culture (routine x 2)     Status: Abnormal (Preliminary result)   Collection Time: 03/27/21  6:05 PM   Specimen: Right Antecubital; Blood  Result Value Ref Range   Specimen Description      RIGHT ANTECUBITAL Performed at [facility], [address], [place], [place] [address]    Special Requests      Blood Culture adequate volume BOTTLES DRAWN AEROBIC AND ANAEROBIC Performed at [facility], [address], [place], [place] [address]    Culture  Setup Time      GRAM POSITIVE COCCI Gram Stain Report Called to,Read Back By and Verified With: RACHEAL TATE,RN @1154  03/28/2021 KAY AEROBIC BOTTLE ONLY CRITICAL RESULT CALLED TO, READ BACK BY AND VERIFIED WITH: VICTORIA BASS RN @1627  03/28/21 EB ANAEROBIC BOTTLE ONLY GRAM POSITIVE COCCI Gram Stain Report Called to,Read Back By and Verified With: PREVIOUSLY CALLED @1154  03/28/2021 TO RACHEAL TATE,RN    Culture (A)     STAPHYLOCOCCUS HOMINIS THE SIGNIFICANCE OF ISOLATING THIS ORGANISM FROM A SINGLE SET OF BLOOD CULTURES WHEN MULTIPLE SETS ARE DRAWN IS UNCERTAIN. PLEASE NOTIFY THE MICROBIOLOGY DEPARTMENT WITHIN ONE WEEK IF SPECIATION AND SENSITIVITIES ARE REQUIRED. Performed at [facility], [place] [address], [place], [place] [address]    Report Status PENDING   HIV Antibody (routine testing w rflx)     Status: None   Collection Time: 03/27/21  6:05 PM  Result Value Ref Range   HIV Screen 4th Generation wRfx Non Reactive Non Reactive    Comment: Performed at [facility], [place] [address], [place], [place] [address]  Blood Culture ID Panel (Reflexed)     Status: Abnormal   Collection Time: 03/27/21  6:05 PM  Result Value Ref Range   Enterococcus faecalis NOT DETECTED NOT DETECTED   Enterococcus Faecium NOT DETECTED NOT DETECTED   Listeria monocytogenes NOT DETECTED NOT DETECTED   Staphylococcus species DETECTED (A) NOT DETECTED    Comment: CRITICAL RESULT CALLED TO, READ BACK  BY AND VERIFIED WITH: VICTORIA BASS RN @1627  03/28/21 EB    Staphylococcus aureus (BCID) NOT DETECTED NOT DETECTED   Staphylococcus epidermidis NOT DETECTED NOT DETECTED   Staphylococcus lugdunensis NOT DETECTED NOT DETECTED   Streptococcus species NOT DETECTED NOT DETECTED   Streptococcus agalactiae NOT DETECTED NOT DETECTED   Streptococcus pneumoniae NOT DETECTED NOT DETECTED   Streptococcus pyogenes NOT DETECTED NOT DETECTED   A.calcoaceticus-baumannii NOT DETECTED NOT DETECTED   Bacteroides fragilis NOT DETECTED NOT DETECTED   Enterobacterales NOT DETECTED NOT DETECTED   Enterobacter cloacae complex NOT DETECTED NOT DETECTED   Escherichia coli NOT DETECTED NOT DETECTED   Klebsiella aerogenes NOT DETECTED NOT DETECTED   Klebsiella oxytoca NOT DETECTED NOT DETECTED   Klebsiella pneumoniae NOT DETECTED NOT DETECTED   Proteus species NOT DETECTED NOT DETECTED   Salmonella species NOT DETECTED NOT DETECTED   Serratia marcescens NOT DETECTED NOT DETECTED   Haemophilus influenzae NOT DETECTED NOT  DETECTED   Neisseria meningitidis NOT DETECTED NOT DETECTED   Pseudomonas aeruginosa NOT DETECTED NOT DETECTED   Stenotrophomonas maltophilia NOT DETECTED NOT DETECTED   Candida albicans NOT DETECTED NOT DETECTED   Candida auris NOT DETECTED NOT DETECTED   Candida glabrata NOT DETECTED NOT DETECTED   Candida krusei NOT DETECTED NOT DETECTED   Candida parapsilosis NOT DETECTED NOT DETECTED   Candida tropicalis NOT DETECTED NOT DETECTED   Cryptococcus neoformans/gattii NOT DETECTED NOT DETECTED    Comment: Performed at [facility] [address] [place], [place] [address]  Resp Panel by RT-PCR (Flu A&B, Covid) Nasopharyngeal Swab     Status: None   Collection Time: 03/27/21  6:07 PM   Specimen: Nasopharyngeal Swab; Nasopharyngeal(NP) swabs in vial transport medium  Result Value Ref Range   SARS Coronavirus 2 by RT PCR NEGATIVE NEGATIVE    Comment: (NOTE) SARS-CoV-2 target nucleic  acids are NOT DETECTED.  The SARS-CoV-2 RNA is generally detectable in upper respiratory specimens during the acute phase of infection. The lowest concentration of SARS-CoV-2 viral copies this assay can detect is 138 copies/mL. A negative result does not preclude SARS-Cov-2 infection and should not be used as the sole basis for treatment or other patient management decisions. A negative result may occur with  improper specimen collection/handling, submission of specimen other than nasopharyngeal swab, presence of viral mutation(s) within the areas targeted by this assay, and inadequate number of viral copies(<138 copies/mL). A negative result must be combined with clinical observations, patient history, and epidemiological information. The expected result is Negative.  Fact Sheet for Patients:  EntrepreneurPulse.com.au  Fact Sheet for Healthcare Providers:  IncredibleEmployment.be  This test is no t yet approved or cleared by the [place] FDA and  has been authorized for detection and/or diagnosis of SARS-CoV-2 by FDA under an Emergency Use Authorization (EUA). This EUA will remain  in effect (meaning this test can be used) for the duration of the COVID-19 declaration under Section 564(b)(1) of the Act, 21 U.S.C.section 360bbb-3(b)(1), unless the authorization is terminated  or revoked sooner.       Influenza A by PCR NEGATIVE NEGATIVE   Influenza B by PCR NEGATIVE NEGATIVE    Comment: (NOTE) The Xpert Xpress SARS-CoV-2/FLU/RSV plus assay is intended as an aid in the diagnosis of influenza from Nasopharyngeal swab specimens and should not be used as a sole basis for treatment. Nasal washings and aspirates are unacceptable for Xpert Xpress SARS-CoV-2/FLU/RSV testing.  Fact Sheet for Patients: EntrepreneurPulse.com.au  Fact Sheet for Healthcare Providers: IncredibleEmployment.be  This test is not  yet approved or cleared by the [place] FDA and has been authorized for detection and/or diagnosis of SARS-CoV-2 by FDA under an Emergency Use Authorization (EUA). This EUA will remain in effect (meaning this test can be used) for the duration of the COVID-19 declaration under Section 564(b)(1) of the Act, 21 U.S.C. section 360bbb-3(b)(1), unless the authorization is terminated or revoked.  Performed at [facility], [address], [place],  [address]   Blood Culture (routine x 2)     Status: None (Preliminary result)   Collection Time: 03/27/21  6:20 PM   Specimen: BLOOD LEFT HAND  Result Value Ref Range   Specimen Description BLOOD LEFT HAND    Special Requests      Blood Culture adequate volume BOTTLES DRAWN AEROBIC AND ANAEROBIC   Culture      NO GROWTH 2 DAYS Performed at [facility], [address] [place], [place]  [address]    Report Status PENDING   Lactic acid, plasma     Status: None   Collection Time: 03/27/21  7:53 PM  Result Value Ref Range   Lactic Acid, Venous 0.8 0.5 - 1.9 mmol/L    Comment: Performed at [facility], [address], [place], [place] [address]  C-reactive protein     Status: Abnormal   Collection Time: 03/27/21  7:53 PM  Result Value Ref Range   CRP 19.7 (H) <1.0 mg/dL    Comment: Performed at [facility], [address] [place], [place] [address]  Sedimentation rate     Status: Abnormal   Collection Time: 03/27/21  7:54 PM  Result Value Ref Range   Sed Rate 108 (H) 0 - 16 mm/hr    Comment: Performed at [facility], [address], [place], [place] [address]  Glucose, capillary     Status: Abnormal   Collection Time: 03/27/21  9:57 PM  Result Value Ref Range   Glucose-Capillary 146 (H) 70 - 99 mg/dL    Comment: Glucose reference range applies only to samples taken after fasting for at least 8 hours.  Magnesium     Status: None   Collection Time: 03/28/21  6:55 AM  Result Value Ref Range   Magnesium 1.8 1.7 - 2.4 mg/dL     Comment: Performed at [facility], [address] [place],  [address]  Glucose, capillary     Status: Abnormal   Collection Time: 03/28/21  7:24 AM  Result Value Ref Range   Glucose-Capillary 136 (H) 70 - 99 mg/dL    Comment: Glucose reference range applies only to samples taken after fasting for at least 8 hours.  Glucose, capillary     Status: Abnormal   Collection Time: 03/28/21 11:11 AM  Result Value Ref Range   Glucose-Capillary 242 (H) 70 - 99 mg/dL    Comment: Glucose reference range applies only to samples taken after fasting for at least 8 hours.  Glucose, capillary     Status: Abnormal   Collection Time: 03/28/21  4:41 PM  Result Value Ref Range   Glucose-Capillary 159 (H) 70 - 99 mg/dL    Comment: Glucose reference range applies only to samples taken after fasting for at least 8 hours.  Glucose, capillary     Status: Abnormal   Collection Time: 03/28/21  9:36 PM  Result Value Ref Range   Glucose-Capillary 155 (H) 70 - 99 mg/dL    Comment: Glucose reference range applies only to samples taken after fasting for at least 8 hours.  Glucose, capillary     Status: Abnormal   Collection Time: 03/29/21  6:45 AM  Result Value Ref Range   Glucose-Capillary 140 (H) 70 - 99 mg/dL    Comment: Glucose reference range applies only to samples taken after fasting for at least 8 hours.  Glucose, capillary     Status: Abnormal   Collection Time: 03/29/21 11:24 AM  Result Value Ref Range   Glucose-Capillary 151 (H) 70 - 99 mg/dL    Comment: Glucose reference range applies only to samples taken after fasting for at least 8 hours.    MICRO:  IMAGING: MR FOOT RIGHT W WO CONTRAST  Result Date: 03/29/2021 CLINICAL DATA:  History of diabetes.  Right great toe swelling. EXAM: MRI OF THE RIGHT FOREFOOT WITHOUT AND WITH CONTRAST TECHNIQUE: Multiplanar, multisequence MR imaging of the right forefoot was performed before and after the administration of intravenous contrast. CONTRAST:  76mL  GADAVIST GADOBUTROL 1 MMOL/ML IV SOLN COMPARISON:  None. FINDINGS: Significant patient motion degrades image quality limiting evaluation. Bones/Joint/Cartilage Soft tissue wound along the plantar aspect of the first MTP joint. Large first MTP joint effusion with synovial enhancement. Bone marrow edema in the first metatarsal head, first proximal phalanx and medial hallux sesamoid. No acute fracture or dislocation. Normal alignment. 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 Mild osteoarthritis of the navicular-middle cuneiform joint. Ligaments Collateral ligaments are intact.  Lisfranc ligament is intact. Muscles and Tendons Flexor, peroneal and extensor compartment tendons are intact. Diffuse T2 hyperintense signal throughout the plantar musculature concerning for mild myositis. Complex peripherally enhancing fluid collection in the flexor hallucis brevis muscle measuring 1.6 x 1 x 2.9 cm most consistent with an abscess. Soft tissue Severe edema of the great toe extending into the dorsal forefoot consistent with cellulitis. Soft tissue emphysema within the great toe. Large soft tissue wound along the plantar lateral aspect of the first MTP joint. Complex fluid collection within the subcutaneous fat along the medial aspect of the first MTP joint measuring approximately 4.2 x 1 x 3.4 cm. IMPRESSION: 1. Large soft tissue wound along the plantar lateral aspect of the first MTP joint with severe cellulitis of the great toe extending into the dorsal aspect of the foot and soft tissue emphysema within the great toe. Abscess within the subcutaneous fat along the medial aspect of the first MTP joint measuring approximately 4.2 x 1 x 3.4 cm. Complex peripherally enhancing fluid collection in the flexor hallucis brevis muscle measuring 1.6 x 1 x 2.9 cm most consistent with an abscess. 2. Septic arthritis of the first MTP joint with osteomyelitis of the first metatarsal head, first proximal phalanx and medial hallux sesamoid.  3. Diffuse T2 hyperintense signal throughout the plantar musculature concerning for myositis. Electronically Signed   By: Kathreen Devoid   On: 03/29/2021 10:09   US ARTERIAL [name] (SCREENING LOWER EXTREMITY)  Result Date: 03/28/2021 CLINICAL DATA:  55 year old male with right toe ulceration. EXAM: NONINVASIVE PHYSIOLOGIC VASCULAR STUDY OF BILATERAL LOWER EXTREMITIES TECHNIQUE: Evaluation of both lower extremities were performed at rest, including calculation of ankle-brachial indices with single level Doppler, pressure and pulse volume recording. COMPARISON:  06/30/2015 FINDINGS: Right [name]:  0.98 Left [name]:  Not obtained. Right Lower Extremity: Monophasic waveforms at the level of the ankle. The posterior tibial artery is noncompressible. IMPRESSION: Normal right lower extremity ankle brachial index of 0.98, previously 1.06, however the posterior tibial artery is noncompressible and there are monophasic waveforms in the posterior tibial and dorsalis pedis arteries, suggestive of spuriously elevated [name]. Ruthann Cancer, MD [facility] Radiology Electronically Signed   By: Ruthann Cancer MD   On: 03/28/2021 13:31   DG Chest Port 1 View  Result Date: 03/27/2021 CLINICAL DATA:  Known right foot drainage with first toe ulcer and possible sepsis, initial encounter EXAM: PORTABLE CHEST 1 VIEW COMPARISON:  02/24/2021 FINDINGS: The heart size and mediastinal contours are within normal limits. Both lungs are clear. The visualized skeletal structures are unremarkable. IMPRESSION: No active disease. Electronically Signed   By: Inez Catalina M.D.   On: 03/27/2021 17:45   DG Foot Complete Right  Result Date: 03/27/2021 CLINICAL DATA:  First toe ulcer EXAM: RIGHT FOOT COMPLETE - 3+ VIEW COMPARISON:  09/13/2021 FINDINGS: There is a soft tissue ulcer beneath the head of the first metatarsal. Considerable subcutaneous air is noted consistent with localized infection. No acute  fracture is seen. There is  a lucent area in the medial aspect of the first metatarsal head which appears slightly larger than that seen on the prior exam. Possibility of osteomyelitis would deserve consideration. MRI would be more sensitive in this regard. No other bony abnormality is noted. IMPRESSION: Soft tissue ulcer with considerable subcutaneous air consistent with localized infection. There is an area of lucency in the medial aspect of the first metatarsal head which appears slightly larger than that seen on the prior exam. Underlying osteomyelitis would be difficult to exclude. MRI with the flow in this regard as clinically necessary. Electronically Signed   By: Inez Catalina M.D.   On: 03/27/2021 17:49    HISTORICAL MICRO/IMAGING  Assessment/Plan:  56yo M with osteomyelitis of right  first MTP/MTH and abscess along flexor hallucis brevis muscle - gangrenous changes requiring source control. Suspect he will need debridement with ray amputation.  - recommend to see if he should also get vascular studies to the right leg to see sufficient flow or needs any revascularization to have successful healing from 1st ray amputation. - continue on broad spectrum abtx  - will ask lab to get sensi on staph hominis- it is potentially contaminant though he has an active infection. Currently on vancomycin   - depending on source control, will dictate length of abtx therapy  Alcohol dependence = defer to primary team to watch for withdrawal symptoms  Grief, loss of wife = may benefit for counseling/ SSRI?  [facility] [phone]

## 2021-03-29 NOTE — Op Note (Signed)
  Patient Name: Eugene Diaz DOB: 20-Jan-1966  MRN: [id number]   Date of Surgery: 03/29/21  Surgeon: Dr. Hardie Pulley, DPM Assistants: non  Pre-operative Diagnosis:  Osteomyelitis, septic joint Post-operative Diagnosis:  Same Procedures:  1) incision and drainage of right foot abscess, complex  2) partial first ray resection right Pathology/Specimens: ID Type Source Tests Collected by Time Destination  1 : Right Great toe Tissue PATH Digit amputation SURGICAL PATHOLOGY Evelina Bucy, DPM 03/29/2021 1559   A :  Right Great Toe Swab Culture Tissue PATH Soft tissue AEROBIC/ANAEROBIC CULTURE W GRAM STAIN (SURGICAL/DEEP WOUND) Evelina Bucy, DPM 03/29/2021 1550   B : Right First Metatarsal Tissue Soft Tissue, Other AEROBIC/ANAEROBIC CULTURE W Lonell Grandchild STAIN (SURGICAL/DEEP WOUND) Evelina Bucy, DPM 03/29/2021 1604    Anesthesia: General Hemostasis: * No tourniquets in log * Estimated Blood Loss: 25 ml Materials: * No implants in log * Medications: 10 cc half percent Marcaine plain, 1 g vancomycin powder topical Complications: none  Indications for Procedure:  This is a 55 y.o. male with a severe right foot infection with signs of osteomyelitis and septic joint.  He presents for urgent incision and drainage all risk/terms of surgery discussed the patient no guarantees were given   Procedure in Detail: Patient was identified in pre-operative holding area. Formal consent was signed and the right lower extremity was marked. Patient was brought back to the operating room. Anesthesia was induced. The extremity was prepped and draped in the usual sterile fashion. Timeout was taken to confirm patient name, laterality, and procedure prior to incision.   Attention was then directed to the right foot.  The right great toe was black in appearance.  An incision was made at the medial side of the digit and there was absence of bleeding.  A deep cavity with purulence was noted and this was collected  for culture. A racquet shaped incision was made about the first metatarsophalangeal joint with care to excise the plantar first metatarsal ulcer.  Dissection was continued down to level of the first metatarsophalangeal joint capsule.  The capsule was incised and purulence was noted in the joint.  The great toe was disarticulated at the first metatarsophalangeal joint and sent for pathology.  The metatarsal head was freed and an osteotomy was performed proximal to the surgical neck.  The bone appeared healthy and viable at this level.  The excised portion was sent with the toe for pathology.  The wound was then irrigated and a rongeur was used to biopsy a portion of the first metatarsal for clean margin microbiology.  The wound was then further copiously irrigated with 3 L normal saline.  The deep closure was performed with 0 Vicryl.  The skin was loosely approximated with 2-0 nylon  Vancomycin powder was applied topically to the wound.  Surgicel was packed into the wound.  The foot was then dressed with Betadine soaked 4 x 4's, Kerlix, ABD pad, Ace bandage. Patient tolerated the procedure well.  Disposition: Following a period of post-operative monitoring, patient will be transferred back to the floor.  He will need return to the operating room for definitive closure.  He may benefit from further first metatarsal resection.

## 2021-03-29 NOTE — Progress Notes (Signed)
Pharmacy Antibiotic Note  Eugene Diaz is a 55 y.o. male admitted on 03/27/2021 with diabetic foot infection with abscess and osteomyelitis. Pharmacy has been consulted for Vancomycin and Cefepime dosing.  MRI on 5/30 showing osteomyelitis of the first metatarsal head.  General surgery following. Patient is currently on vancomycin 2000mg  IV q12 hours- obtaining vancomycin random prior to next dose to make sure dose is appropriate.   Plan: Obtain vancomycin random at 1700 tonight, adj vancomycin as necessary D/c ceftriaxone Begin cefepime 2g IV q8 hours Flagyl 500mg  IV q8 hours per MD F/U cxs and clinical progress Monitor V/S, labs and levels as indicated  Height: 6\' 2"  (188 cm) Weight: 131.5 kg (290 lb) IBW/kg (Calculated) : 82.2  Temp (24hrs), Avg:98.6 F (37 C), Min:98 F (36.7 C), Max:99.1 F (37.3 C)  Recent Labs  Lab 03/27/21 1805 03/27/21 1953  WBC 7.6  --   CREATININE 1.08  --   LATICACIDVEN 1.5 0.8    Estimated Creatinine Clearance: 110.1 mL/min (by C-G formula based on SCr of 1.08 mg/dL).    No Known Allergies  Antimicrobials this admission: Vancomycin 5/28 >>  Ceftriaxone 5/28 >> 5/30 Cefepime 5/30 >> Flagyl 5/30 >>   Microbiology results: 5/28 BCx: 1/4 bottles staph species 5/28 NCx: ngtd  Thank you for allowing pharmacy to be a part of this patient's care.  Dimple Nanas, PharmD PGY-1 Acute Care Pharmacy Resident 03/29/2021 10:51 AM

## 2021-03-29 NOTE — Anesthesia Postprocedure Evaluation (Signed)
Anesthesia Post Note  Patient: Eugene Diaz  Procedure(s) Performed: AMPUTATION RAY (Right ) IRRIGATION AND DEBRIDEMENT EXTREMITY (Right )     Patient location during evaluation: PACU Anesthesia Type: General Level of consciousness: awake and alert, awake and oriented Pain management: pain level controlled Vital Signs Assessment: post-procedure vital signs reviewed and stable Respiratory status: spontaneous breathing, nonlabored ventilation, respiratory function stable and patient connected to nasal cannula oxygen Cardiovascular status: blood pressure returned to baseline and stable Postop Assessment: no apparent nausea or vomiting Anesthetic complications: no   No complications documented.  Last Vitals:  Vitals:   03/29/21 1645 03/29/21 1658  BP: (!) 150/74 132/70  Pulse: 69 69  Resp: 17 17  Temp: (!) 36.4 C 36.9 C  SpO2: 100% 95%    Last Pain:  Vitals:   03/29/21 1630  TempSrc:   PainSc: 0-No pain                 Catalina Gravel

## 2021-03-29 NOTE — Consult Note (Signed)
Reason for Consult: OM/septic joint right foot Referring Physician: Issac Moure is an 55 y.o. male.  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 Patient has not followed up for this issue since 01/01/21. Patient was admitted to [facility] and transferred here. Wound has worsened over the last two weeks with the great toe darkening. Subjective fevers at home with decreased appetite over the past 3-4 days. Last ate at 8am today.  MRI performed today.  Past Medical History:  Diagnosis Date  . Alcohol abuse   . Alcoholic peripheral neuropathy ([place])   . Anxiety   . Arthritis   . Asthma    followed by pcp  . B12 deficiency   . Depression   . Diabetic neuropathy ([place])   . Edema of both lower extremities   . Essential tremor    neurologist-- dr patel--- due to alcohol abuse  . GERD (gastroesophageal reflux disease)   . History of cellulitis 2020   left lower leg;   recurrent 03-25-2020  . History of esophageal stricture    s/p  dilatation 02-13-2018  . History of osteomyelitis 2020   left great toe    . Hypercholesterolemia   . Hypertension    followed by pcp  (nuclear stress test 03-11-2014 low risk w/ no ischemia, ef 65%  . Normocytic anemia    followed by pcp   (03-18-2020 had transfusion's 02/ 2021)  . Peripheral vascular disease ([place])   . 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 Thrombocytopenia (H[facility])    chronic  . Type 2 diabetes mellitus ([place])    followed by pcp---    (03-18-2020  pt stated does not check blood sugar)    Past Surgical History:  Procedure Laterality Date  . AMPUTATION Left 10/16/2019   Procedure: Left partial second ray resection; placement of  antibiotic beads;  Surgeon: Evelina Bucy, DPM;  [facility];  Service: Podiatry;  Laterality: Left;  . AMPUTATION Left 11/13/2019   Procedure: Left Midfoot Amputation - Transmetatarsal vs. Lisfranc; Placement antibiotic beads;  Surgeon: Evelina Bucy, DPM;  [facility];  Service: Podiatry;  Laterality: Left;  . AMPUTATION Left 12/21/2019   Procedure: Chopart Amputation left foot;  Surgeon: Evelina Bucy, DPM;  Location: [place];  Service: Podiatry;  Laterality: Left;  . AMPUTATION Left 06/24/2020   Procedure: LEFT BELOW KNEE AMPUTATION;  Surgeon: Newt Minion, MD;  Location: [place];  Service: Orthopedics;  Laterality: Left;  . AMPUTATION TOE Left 08/14/2019   Procedure: AMPUTATION GREAT TOE;  Surgeon: Evelina Bucy, DPM;  [facility];  Service: Podiatry;  Laterality: Left;  . APPLICATION OF WOUND VAC Left 12/21/2019   Procedure: Application Of Wound Vac;  Surgeon: Evelina Bucy, DPM;  Location: [place];  Service: Podiatry;  Laterality: Left;  . BIOPSY  02/13/2018   Procedure: BIOPSY;  Surgeon: Danie Binder, MD;  [facility];  Service: Endoscopy;;  transverse colon biopsy, gastric biopsy  . COLONOSCOPY WITH PROPOFOL N/A 02/13/2018   Procedure: COLONOSCOPY WITH PROPOFOL;  Surgeon: Danie Binder, MD;  [facility];  Service: Endoscopy;  Laterality: N/A;  11:15am  . ESOPHAGOGASTRODUODENOSCOPY (EGD) WITH PROPOFOL N/A 02/13/2018   Procedure: ESOPHAGOGASTRODUODENOSCOPY (EGD) WITH PROPOFOL;  Surgeon: Danie Binder, MD;  [facility];  Service: Endoscopy;  Laterality: N/A;  . I & D EXTREMITY Left 07/16/2019   Procedure: Insicion  AND DEBRIDEMENT EXTREMITY;  Surgeon: Evelina Bucy, DPM;  Location: [place];  Service: Podiatry;  Laterality: Left;  . I & D EXTREMITY Left 06/22/2020   Procedure: IRRIGATION AND DEBRIDEMENT EXTREMITY, left foot and ankle;  Surgeon: Evelina Bucy, DPM;  Location: [place];  Service: Podiatry;  Laterality: Left;  .  INCISION AND DRAINAGE OF WOUND Left 10/13/2019   Procedure: IRRIGATION AND DEBRIDEMENT WOUND OF LEFT FOOT AND FIRST METATARSAL RESECTION;  Surgeon: Evelina Bucy, DPM;  [facility];  Service: Podiatry;  Laterality: Left;  . IRRIGATION AND DEBRIDEMENT FOOT Left 11/11/2019   Procedure: Left Foot Wound Irrigation and Debridement;  Surgeon: Evelina Bucy, DPM;  [facility];  Service: Podiatry;  Laterality: Left;  . Left arm     Left arm repair (tendon and artery)  . PERCUTANEOUS PINNING  07/16/2019   Procedure: Open Reduction Percutaneous Pinning Extremity;  Surgeon: Evelina Bucy, DPM;  Location: [place];  Service: Podiatry;;  . PILONIDAL CYST EXCISION N/A 08/08/2014   Procedure: CYST EXCISION PILONIDAL EXTENSIVE;  Surgeon: Jamesetta So, MD;  [facility];  Service: General;  Laterality: N/A;  . POLYPECTOMY  02/13/2018   Procedure: POLYPECTOMY;  Surgeon: Danie Binder, MD;  [facility];  Service: Endoscopy;;  transverse colon polyp hs, rectal polyps times 2  . SAVORY DILATION N/A 02/13/2018   Procedure: SAVORY DILATION;  Surgeon: Danie Binder, MD;  [facility];  Service: Endoscopy;  Laterality: N/A;  . WOUND DEBRIDEMENT Left 09/04/2019   Procedure: DEBRIDEMENT WOUND WITH COMPLEX  REPAIR OF DEHISCENCE;  Surgeon: Evelina Bucy, DPM;  Location: [place];  Service: Podiatry;  Laterality: Left;  Leave patient on stretcher  . WOUND DEBRIDEMENT Left 10/16/2019   Procedure: Left foot wound debridement and closure;  Surgeon: Evelina Bucy, DPM;  [facility];  Service: Podiatry;  Laterality: Left;  . WOUND DEBRIDEMENT Left 12/18/2019   Procedure: LEFT FOOT DEBRIDEMENT WITH PARTIAL INCISION OF INFECTED BONE;  Surgeon: Evelina Bucy, DPM;  Location: [place];  Service: Podiatry;  Laterality: Left;  LEFT FOOT DEBRIDEMENT WITH PARTIAL INCISION OF INFECTED BONE    Family History  Problem Relation Age of Onset  . Heart attack Mother         Living, 51  . Cancer Father        Deceased  . Healthy Brother   . Healthy Sister   . Colon cancer Neg Hx   . Colon polyps Neg Hx     Social History:  reports that he quit smoking about 36 years ago. His smoking use included cigarettes. He has a 62.50 pack-year smoking history. He has never used smokeless tobacco. He reports current alcohol use of about 50.0 standard drinks of alcohol per week. He reports previous drug use. Drug: Marijuana.  Allergies: No Known Allergies  Medications: I have reviewed the patient's current medications.  Results for orders placed or performed during the hospital encounter of 03/27/21 (from the past 48 hour(s))  CBG monitoring, ED     Status: Abnormal   Collection Time: 03/27/21  4:59 PM  Result Value Ref Range   Glucose-Capillary 169 (H) 70 -  99 mg/dL    Comment: Glucose reference range applies only to samples taken after fasting for at least 8 hours.  Lactic acid, plasma     Status: None   Collection Time: 03/27/21  6:05 PM  Result Value Ref Range   Lactic Acid, Venous 1.5 0.5 - 1.9 mmol/L    Comment: Performed at [facility], [address], [place], [place] [address]  Comprehensive metabolic panel     Status: Abnormal   Collection Time: 03/27/21  6:05 PM  Result Value Ref Range   Sodium 131 (L) 135 - 145 mmol/L   Potassium 3.6 3.5 - 5.1 mmol/L   Chloride 96 (L) 98 - 111 mmol/L   CO2 24 22 - 32 mmol/L   Glucose, Bld 176 (H) 70 - 99 mg/dL    Comment: Glucose reference range applies only to samples taken after fasting for at least 8 hours.   BUN 17 6 - 20 mg/dL   Creatinine, Ser 1.08 0.61 - 1.24 mg/dL   Calcium 8.7 (L) 8.9 - 10.3 mg/dL   Total Protein 8.6 (H) 6.5 - 8.1 g/dL   Albumin 3.1 (L) 3.5 - 5.0 g/dL   AST 67 (H) 15 - 41 U/L   ALT 29 0 - 44 U/L   Alkaline Phosphatase 93 38 - 126 U/L   Total Bilirubin 0.7 0.3 - 1.2 mg/dL   GFR, Estimated >60 >60 mL/min    Comment: (NOTE) Calculated using the CKD-EPI Creatinine Equation (2021)     Anion gap 11 5 - 15    Comment: Performed at [facility], [address] [place], [place] [address]  CBC WITH DIFFERENTIAL     Status: Abnormal   Collection Time: 03/27/21  6:05 PM  Result Value Ref Range   WBC 7.6 4.0 - 10.5 K/uL   RBC 4.30 4.22 - 5.81 MIL/uL   Hemoglobin 12.4 (L) 13.0 - 17.0 g/dL   HCT 38.1 (L) 39.0 - 52.0 %   MCV 88.6 80.0 - 100.0 fL   MCH 28.8 26.0 - 34.0 pg   MCHC 32.5 30.0 - 36.0 g/dL   RDW 13.1 11.5 - 15.5 %   Platelets 218 150 - 400 K/uL   nRBC 0.0 0.0 - 0.2 %   Neutrophils Relative % 80 %   Neutro Abs 6.2 1.7 - 7.7 K/uL   Lymphocytes Relative 11 %   Lymphs Abs 0.8 0.7 - 4.0 K/uL   Monocytes Relative 6 %   Monocytes Absolute 0.4 0.1 - 1.0 K/uL   Eosinophils Relative 1 %   Eosinophils Absolute 0.1 0.0 - 0.5 K/uL   Basophils Relative 1 %   Basophils Absolute 0.0 0.0 - 0.1 K/uL   Immature Granulocytes 1 %   Abs Immature Granulocytes 0.07 0.00 - 0.07 K/uL    Comment: Performed at [facility], [address], [place], [place] [address]  Protime-INR     Status: None   Collection Time: 03/27/21  6:05 PM  Result Value Ref Range   Prothrombin Time 14.1 11.4 - 15.2 seconds   INR 1.1 0.8 - 1.2    Comment: (NOTE) INR goal varies based on device and disease states. Performed at [facility], [address], [place], [place] [address]   APTT     Status: None   Collection Time: 03/27/21  6:05 PM  Result Value Ref Range   aPTT 29 24 - 36 seconds    Comment: Performed at [facility], [address] [place], [place] [address]  Blood  Culture (routine x 2)     Status: Abnormal (Preliminary result)   Collection Time: 03/27/21  6:05 PM   Specimen: Right Antecubital; Blood  Result Value Ref Range   Specimen Description      RIGHT ANTECUBITAL Performed at [facility], [address], [place], [place] [address]    Special Requests      Blood Culture adequate volume BOTTLES DRAWN AEROBIC AND ANAEROBIC Performed at [facility], [address] [place], [place]  [address]    Culture  Setup Time      GRAM POSITIVE COCCI Gram Stain Report Called to,Read Back By and Verified With: RACHEAL TATE,RN _0  03/28/2021 [facility] AEROBIC BOTTLE ONLY CRITICAL RESULT CALLED TO, READ BACK BY AND VERIFIED WITH: VICTORIA BASS RN _1  03/28/21 [facility] ANAEROBIC BOTTLE ONLY GRAM POSITIVE COCCI Gram Stain Report Called to,Read Back By and Verified With: PREVIOUSLY CALLED _2  03/28/2021 TO RACHEAL TATE,RN    Culture (A)     STAPHYLOCOCCUS HOMINIS THE SIGNIFICANCE OF ISOLATING THIS ORGANISM FROM A SINGLE SET OF BLOOD CULTURES WHEN MULTIPLE SETS ARE DRAWN IS UNCERTAIN. PLEASE NOTIFY THE MICROBIOLOGY DEPARTMENT WITHIN ONE WEEK IF SPECIATION AND SENSITIVITIES ARE REQUIRED. Performed at [facility], [place] [address] [place], [place] [address]    Report Status PENDING   HIV Antibody (routine testing w rflx)     Status: None   Collection Time: 03/27/21  6:05 PM  Result Value Ref Range   HIV Screen 4th Generation wRfx Non Reactive Non Reactive    Comment: Performed at [facility], [place] [address] [place], [place] [address]  Blood Culture ID Panel (Reflexed)     Status: Abnormal   Collection Time: 03/27/21  6:05 PM  Result Value Ref Range   Enterococcus faecalis NOT DETECTED NOT DETECTED   Enterococcus Faecium NOT DETECTED NOT DETECTED   Listeria monocytogenes NOT DETECTED NOT DETECTED   Staphylococcus species DETECTED (A) NOT DETECTED    Comment: CRITICAL RESULT CALLED TO, READ BACK BY AND VERIFIED WITH: VICTORIA BASS RN _3  03/28/21 EB    Staphylococcus aureus (BCID) NOT DETECTED NOT DETECTED   Staphylococcus epidermidis NOT DETECTED NOT DETECTED   Staphylococcus lugdunensis NOT DETECTED NOT DETECTED   Streptococcus species NOT DETECTED NOT DETECTED   Streptococcus agalactiae NOT DETECTED NOT DETECTED   Streptococcus pneumoniae NOT DETECTED NOT DETECTED   Streptococcus pyogenes NOT DETECTED NOT DETECTED   A.calcoaceticus-baumannii NOT DETECTED NOT DETECTED    Bacteroides fragilis NOT DETECTED NOT DETECTED   Enterobacterales NOT DETECTED NOT DETECTED   Enterobacter cloacae complex NOT DETECTED NOT DETECTED   Escherichia coli NOT DETECTED NOT DETECTED   Klebsiella aerogenes NOT DETECTED NOT DETECTED   Klebsiella oxytoca NOT DETECTED NOT DETECTED   Klebsiella pneumoniae NOT DETECTED NOT DETECTED   Proteus species NOT DETECTED NOT DETECTED   Salmonella species NOT DETECTED NOT DETECTED   Serratia marcescens NOT DETECTED NOT DETECTED   Haemophilus influenzae NOT DETECTED NOT DETECTED   Neisseria meningitidis NOT DETECTED NOT DETECTED   Pseudomonas aeruginosa NOT DETECTED NOT DETECTED   Stenotrophomonas maltophilia NOT DETECTED NOT DETECTED   Candida albicans NOT DETECTED NOT DETECTED   Candida auris NOT DETECTED NOT DETECTED   Candida glabrata NOT DETECTED NOT DETECTED   Candida krusei NOT DETECTED NOT DETECTED   Candida parapsilosis NOT DETECTED NOT DETECTED   Candida tropicalis NOT DETECTED NOT DETECTED   Cryptococcus neoformans/gattii NOT DETECTED NOT DETECTED    Comment: Performed at [facility], [address],  [place], [place] [address]  Resp Panel by RT-PCR (Flu A&B, Covid) Nasopharyngeal Swab     Status: None   Collection Time: 03/27/21  6:07 PM   Specimen: Nasopharyngeal Swab; Nasopharyngeal(NP) swabs in vial transport medium  Result Value Ref Range   SARS Coronavirus 2 by RT PCR NEGATIVE NEGATIVE    Comment: (NOTE) SARS-CoV-2 target nucleic acids are NOT DETECTED.  The SARS-CoV-2 RNA is generally detectable in upper respiratory specimens during the acute phase of infection. The lowest concentration of SARS-CoV-2 viral copies this assay can detect is 138 copies/mL. A negative result does not preclude SARS-Cov-2 infection and should not be used as the sole basis for treatment or other patient management decisions. A negative result may occur with  improper specimen collection/handling, submission of specimen other than  nasopharyngeal swab, presence of viral mutation(s) within the areas targeted by this assay, and inadequate number of viral copies(<138 copies/mL). A negative result must be combined with clinical observations, patient history, and epidemiological information. The expected result is Negative.  Fact Sheet for Patients:  EntrepreneurPulse.com.au  Fact Sheet for Healthcare Providers:  IncredibleEmployment.be  This test is no t yet approved or cleared by the [place] FDA and  has been authorized for detection and/or diagnosis of SARS-CoV-2 by FDA under an Emergency Use Authorization (EUA). This EUA will remain  in effect (meaning this test can be used) for the duration of the COVID-19 declaration under Section 564(b)(1) of the Act, 21 U.S.C.section 360bbb-3(b)(1), unless the authorization is terminated  or revoked sooner.       Influenza A by PCR NEGATIVE NEGATIVE   Influenza B by PCR NEGATIVE NEGATIVE    Comment: (NOTE) The Xpert Xpress SARS-CoV-2/FLU/RSV plus assay is intended as an aid in the diagnosis of influenza from Nasopharyngeal swab specimens and should not be used as a sole basis for treatment. Nasal washings and aspirates are unacceptable for Xpert Xpress SARS-CoV-2/FLU/RSV testing.  Fact Sheet for Patients: EntrepreneurPulse.com.au  Fact Sheet for Healthcare Providers: IncredibleEmployment.be  This test is not yet approved or cleared by the [place] FDA and has been authorized for detection and/or diagnosis of SARS-CoV-2 by FDA under an Emergency Use Authorization (EUA). This EUA will remain in effect (meaning this test can be used) for the duration of the COVID-19 declaration under Section 564(b)(1) of the Act, 21 U.S.C. section 360bbb-3(b)(1), unless the authorization is terminated or revoked.  Performed at [facility], [address] [place], [place] [address]   Blood Culture  (routine x 2)     Status: None (Preliminary result)   Collection Time: 03/27/21  6:20 PM   Specimen: BLOOD LEFT HAND  Result Value Ref Range   Specimen Description BLOOD LEFT HAND    Special Requests      Blood Culture adequate volume BOTTLES DRAWN AEROBIC AND ANAEROBIC   Culture      NO GROWTH 2 DAYS Performed at [facility], [address] [place], [place] [address]    Report Status PENDING   Lactic acid, plasma     Status: None   Collection Time: 03/27/21  7:53 PM  Result Value Ref Range   Lactic Acid, Venous 0.8 0.5 - 1.9 mmol/L    Comment: Performed at [facility], [address], [place], [place] [address]  C-reactive protein     Status: Abnormal   Collection Time: 03/27/21  7:53 PM  Result Value Ref Range   CRP 19.7 (H) <1.0 mg/dL    Comment: Performed at [facility], [address], [place], [place] [address]  Sedimentation rate     Status: Abnormal   Collection Time: 03/27/21  7:54 PM  Result Value Ref Range   Sed Rate 108 (H) 0 - 16 mm/hr    Comment: Performed at [facility], [address], [place], [place] [address]  Glucose, capillary     Status: Abnormal   Collection Time: 03/27/21  9:57 PM  Result Value Ref Range   Glucose-Capillary 146 (H) 70 - 99 mg/dL    Comment: Glucose reference range applies only to samples taken after fasting for at least 8 hours.  Magnesium     Status: None   Collection Time: 03/28/21  6:55 AM  Result Value Ref Range   Magnesium 1.8 1.7 - 2.4 mg/dL    Comment: Performed at [facility], [address], [place], [place] [address]  Glucose, capillary     Status: Abnormal   Collection Time: 03/28/21  7:24 AM  Result Value Ref Range   Glucose-Capillary 136 (H) 70 - 99 mg/dL    Comment: Glucose reference range applies only to samples taken after fasting for at least 8 hours.  Glucose, capillary     Status: Abnormal   Collection Time: 03/28/21 11:11 AM  Result Value Ref Range   Glucose-Capillary 242 (H) 70 - 99 mg/dL    Comment: Glucose  reference range applies only to samples taken after fasting for at least 8 hours.  Glucose, capillary     Status: Abnormal   Collection Time: 03/28/21  4:41 PM  Result Value Ref Range   Glucose-Capillary 159 (H) 70 - 99 mg/dL    Comment: Glucose reference range applies only to samples taken after fasting for at least 8 hours.  Glucose, capillary     Status: Abnormal   Collection Time: 03/28/21  9:36 PM  Result Value Ref Range   Glucose-Capillary 155 (H) 70 - 99 mg/dL    Comment: Glucose reference range applies only to samples taken after fasting for at least 8 hours.  Glucose, capillary     Status: Abnormal   Collection Time: 03/29/21  6:45 AM  Result Value Ref Range   Glucose-Capillary 140 (H) 70 - 99 mg/dL    Comment: Glucose reference range applies only to samples taken after fasting for at least 8 hours.  Glucose, capillary     Status: Abnormal   Collection Time: 03/29/21 11:24 AM  Result Value Ref Range   Glucose-Capillary 151 (H) 70 - 99 mg/dL    Comment: Glucose reference range applies only to samples taken after fasting for at least 8 hours.  Renal function panel     Status: Abnormal   Collection Time: 03/29/21  1:10 PM  Result Value Ref Range   Sodium 134 (L) 135 - 145 mmol/L   Potassium 3.1 (L) 3.5 - 5.1 mmol/L   Chloride 107 98 - 111 mmol/L   CO2 22 22 - 32 mmol/L   Glucose, Bld 145 (H) 70 - 99 mg/dL    Comment: Glucose reference range applies only to samples taken after fasting for at least 8 hours.   BUN 11 6 - 20 mg/dL   Creatinine, Ser 0.75 0.61 - 1.24 mg/dL   Calcium 8.3 (L) 8.9 - 10.3 mg/dL   Phosphorus 2.9 2.5 - 4.6 mg/dL   Albumin 2.2 (L) 3.5 - 5.0 g/dL   GFR, Estimated >60 >60 mL/min    Comment: (NOTE) Calculated using the CKD-EPI Creatinine Equation (2021)    Anion gap 5 5 -  15    Comment: Performed at [facility], [place] [address], [place], [place] [address]  Magnesium     Status: None   Collection Time: 03/29/21  1:10 PM  Result Value Ref Range    Magnesium 1.9 1.7 - 2.4 mg/dL    Comment: Performed at [place] [address] [place], [place] [address]  CBC     Status: Abnormal   Collection Time: 03/29/21  1:10 PM  Result Value Ref Range   WBC 9.0 4.0 - 10.5 K/uL   RBC 3.29 (L) 4.22 - 5.81 MIL/uL   Hemoglobin 9.7 (L) 13.0 - 17.0 g/dL   HCT 28.5 (L) 39.0 - 52.0 %   MCV 86.6 80.0 - 100.0 fL   MCH 29.5 26.0 - 34.0 pg   MCHC 34.0 30.0 - 36.0 g/dL   RDW 12.9 11.5 - 15.5 %   Platelets 254 150 - 400 K/uL   nRBC 0.0 0.0 - 0.2 %    Comment: Performed at [facility], [place] [address], [place], [place] [address]    MR FOOT RIGHT W WO CONTRAST  Result Date: 03/29/2021 CLINICAL DATA:  History of diabetes.  Right great toe swelling. EXAM: MRI OF THE RIGHT FOREFOOT WITHOUT AND WITH CONTRAST TECHNIQUE: Multiplanar, multisequence MR imaging of the right forefoot was performed before and after the administration of intravenous contrast. CONTRAST:  86m GADAVIST GADOBUTROL 1 MMOL/ML IV SOLN COMPARISON:  None. FINDINGS: Significant patient motion degrades image quality limiting evaluation. Bones/Joint/Cartilage Soft tissue wound along the plantar aspect of the first MTP joint. Large first MTP joint effusion with synovial enhancement. Bone marrow edema in the first metatarsal head, first proximal phalanx and medial hallux sesamoid. No acute fracture or dislocation. Normal alignment. 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 Mild osteoarthritis of the navicular-middle cuneiform joint. Ligaments Collateral ligaments are intact.  Lisfranc ligament is intact. Muscles and Tendons Flexor, peroneal and extensor compartment tendons are intact. Diffuse T2 hyperintense signal throughout the plantar musculature concerning for mild myositis. Complex peripherally enhancing fluid collection in the flexor hallucis brevis muscle measuring 1.6 x 1 x 2.9 cm most consistent with an abscess. Soft tissue Severe edema of the great toe extending into the dorsal  forefoot consistent with cellulitis. Soft tissue emphysema within the great toe. Large soft tissue wound along the plantar lateral aspect of the first MTP joint. Complex fluid collection within the subcutaneous fat along the medial aspect of the first MTP joint measuring approximately 4.2 x 1 x 3.4 cm. IMPRESSION: 1. Large soft tissue wound along the plantar lateral aspect of the first MTP joint with severe cellulitis of the great toe extending into the dorsal aspect of the foot and soft tissue emphysema within the great toe. Abscess within the subcutaneous fat along the medial aspect of the first MTP joint measuring approximately 4.2 x 1 x 3.4 cm. Complex peripherally enhancing fluid collection in the flexor hallucis brevis muscle measuring 1.6 x 1 x 2.9 cm most consistent with an abscess. 2. Septic arthritis of the first MTP joint with osteomyelitis of the first metatarsal head, first proximal phalanx and medial hallux sesamoid. 3. Diffuse T2 hyperintense signal throughout the plantar musculature concerning for myositis. Electronically Signed   By: HKathreen Devoid  On: 03/29/2021 10:09   [place] [name] (SCREENING LOWER EXTREMITY)  Result Date: 03/28/2021 CLINICAL DATA:  55year old male with right toe ulceration. EXAM: NONINVASIVE PHYSIOLOGIC VASCULAR STUDY OF BILATERAL LOWER EXTREMITIES TECHNIQUE: Evaluation of both  lower extremities were performed at rest, including calculation of ankle-brachial indices with single level Doppler, pressure and pulse volume recording. COMPARISON:  06/30/2015 FINDINGS: Right [name]:  0.98 Left [name]:  Not obtained. Right Lower Extremity: Monophasic waveforms at the level of the ankle. The posterior tibial artery is noncompressible. IMPRESSION: Normal right lower extremity ankle brachial index of 0.98, previously 1.06, however the posterior tibial artery is noncompressible and there are monophasic waveforms in the posterior tibial and dorsalis pedis arteries, suggestive of spuriously  elevated [name]. Ruthann Cancer, MD [facility] Electronically Signed   By: Ruthann Cancer MD   On: 03/28/2021 13:31   DG Chest Port 1 View  Result Date: 03/27/2021 CLINICAL DATA:  Known right foot drainage with first toe ulcer and possible sepsis, initial encounter EXAM: PORTABLE CHEST 1 VIEW COMPARISON:  02/24/2021 FINDINGS: The heart size and mediastinal contours are within normal limits. Both lungs are clear. The visualized skeletal structures are unremarkable. IMPRESSION: No active disease. Electronically Signed   By: Inez Catalina M.D.   On: 03/27/2021 17:45   DG Foot Complete Right  Result Date: 03/27/2021 CLINICAL DATA:  First toe ulcer EXAM: RIGHT FOOT COMPLETE - 3+ VIEW COMPARISON:  09/13/2021 FINDINGS: There is a soft tissue ulcer beneath the head of the first metatarsal. Considerable subcutaneous air is noted consistent with localized infection. No acute fracture is seen. There is a lucent area in the medial aspect of the first metatarsal head which appears slightly larger than that seen on the prior exam. Possibility of osteomyelitis would deserve consideration. MRI would be more sensitive in this regard. No other bony abnormality is noted. IMPRESSION: Soft tissue ulcer with considerable subcutaneous air consistent with localized infection. There is an area of lucency in the medial aspect of the first metatarsal head which appears slightly larger than that seen on the prior exam. Underlying osteomyelitis would be difficult to exclude. MRI with the flow in this regard as clinically necessary. Electronically Signed   By: Inez Catalina M.D.   On: 03/27/2021 17:49    ROS Blood pressure (!) 167/76, pulse 68, temperature 98 F (36.7 C), temperature source Oral, resp. rate 17, height _0  (1.88 m), weight 131.5 kg, SpO2 99 %.  Vitals:   03/29/21 0402 03/29/21 0952  BP: (!) 164/78 (!) 167/76  Pulse: 68 68  Resp: 16 17  Temp: 99 F (37.2 C)  98 F (36.7 C)  SpO2: 97% 99%    General AA&O x3. Normal mood and affect.  Vascular Right foot warm to touch. Right hallux black discoloration  Neurologic Epicritic sensation grossly absent.  Dermatologic (Wound) Right hallux gangrene with crepitus, fluctuance. Malodor. Ascending cellulitis. Plantar 1st MPJ ulceration with fibrogranular base.  Orthopedic: Motor intact RLE HX BKA left.   Assessment/Plan:  OM, Septic joint right 1st toe/metatarsal, with concern for gas gangrene. -Imaging: Studies independently reviewed -Labs reviewed. -Antibiotics: Continue Empiric abx. -WB Status: Heel WB right foot -Surgical Plan: OR today emergently for I and D, partial vs total 1st ray amputation -Continue NPO  Evelina Bucy 03/29/2021, 2:48 PM   Best available via secure chat for questions or concerns.

## 2021-03-29 NOTE — Progress Notes (Signed)
PROGRESS NOTE  Eugene Diaz IOM:[id number] DOB: 1966/07/19   PCP: Eugene Munch, PA-C  Patient is from: Home  DOA: 03/27/2021 LOS: 2  Chief complaints: Left foot/wound infection  Brief Narrative / Interim history: 55 year old M with PMH of NIDDM-2, PVD, left BKA, neuropathy, chronic right foot ulcer, alcohol abuse, anxiety and depression [facility] with right great toe swelling, discoloration, malodorous discharge and worsening right foot ulcer over the ball of his right foot, and admitted for right foot osteomyelitis with right foot diabetic wound infection.  Cultures obtained.  He was a started on ceftriaxone and vancomycin.  X-ray concerning for osteomyelitis in right great toe.  He was evaluated by general surgery, and transferred to [facility] for evaluation and management by podiatry.  MRI of right foot consistent with right foot cellulitis, abscess and osteomyelitis of the right great toe.  Antibiotics broadened to cefepime, Flagyl and vancomycin. [name] with podiatry notified, and recommended n.p.o. for surgical intervention this evening.  Subjective: Seen and examined earlier this morning.  No major events overnight or this morning.  Some pain in his right foot.  Otherwise doing better.  He has Ace wrap and bulky dressing over right foot.  He denies chest pain, dyspnea, GI or UTI symptoms but wants to go to bathroom.   Objective: Vitals:   03/29/21 0035 03/29/21 0103 03/29/21 0402 03/29/21 0952  BP: (!) 180/83 (!) 167/76 (!) 164/78 (!) 167/76  Pulse: 71 70 68 68  Resp: 16 16 16 17   Temp: 99.1 F (37.3 C)  99 F (37.2 C) 98 F (36.7 C)  TempSrc: Oral  Oral Oral  SpO2: 99% 97% 97% 99%  Weight:      Height:        Intake/Output Summary (Last 24 hours) at 03/29/2021 1137 Last data filed at 03/29/2021 0900 Gross per 24 hour  Intake 1060 ml  Output 1100 ml  Net -40 ml   Filed Weights   03/27/21 1656  Weight: 131.5 kg    Examination:  GENERAL: No  apparent distress.  Nontoxic. HEENT: MMM.  Vision and hearing grossly intact.  NECK: Supple.  No apparent JVD.  RESP:  No IWOB.  Fair aeration bilaterally. CVS:  RRR. Heart sounds normal.  ABD/GI/GU: BS+. Abd soft, NTND.  MSK/EXT: Left BKA.  Ace wrap and bulky dressing over right foot.  SKIN: no apparent skin lesion or wound NEURO: Awake, alert and oriented appropriately.  No apparent focal neuro deficit. PSYCH: Calm. Normal affect.   Procedures:  None  Microbiology summarized: 5/28-COVID-19 and influenza PCR nonreactive. 5/28-blood culture with staph species (not aureus) in 1 out of 2 bottles 5/30-blood cultures pending   Assessment & Plan: Right foot cellulitis/abscess/osteomyelitis/diabetic wound infection-MRI right foot concerning for cellulitis, abscess and osteomyelitis.  Inflammatory markers elevated.  [name] with noncompressible PT. -Continue vancomycin. -Change ceftriaxone to cefepime and Flagyl for broader coverage -Podiatry, [name] notified.  He will take patient to the OR this evening -N.p.o. except sips with meds  Positive blood culture: Blood culture from 528 with staph species but not aureus in 1 out of 2 bottles.  I suspect this to be contaminant.  Repeat blood culture pending -Covered with antibiotics as above -Follow repeat blood cultures   Uncontrolled NIDDM-2 with hyperglycemia, neuropathy and diabetic wound infection: A1c was 6.0% in 05/2020 Recent Labs  Lab 03/28/21 1111 03/28/21 1641 03/28/21 2136 03/29/21 0645 03/29/21 1124  GLUCAP 242* 159* 155* 140* 151*  -Recheck A1c -Continue SSI-moderate -Further adjustment  after surgery based on CBG -Continue statin, aspirin, Cymbalta and gabapentin  Uncontrolled hypertension: BP elevated.  Partly due to IV fluid. -Decrease IV fluid from 125 to 50 cc an hour -Continue home lisinopril, amlodipine -Increase hydralazine from 50 to 75 mg 3 times daily -Hydralazine 25 mg p.o. as needed  PAD-[name] normal at  0.98 but noncompressible PTA with monophasic waveforms in both PT and DP. 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  Heavy drinker before that.  No withdrawal symptoms. -Continue CIWA with as needed Ativan -Continue thiamine's  Anxiety and depression: Stable -Continue home medications   Morbid obesity Body mass index is 37.23 kg/m.  -Encourage lifestyle change to lose weight -Could benefit from GLP-1 inhibitors       DVT prophylaxis:  heparin injection 5,000 Units Start: 03/27/21 2200 SCDs Start: 03/27/21 2130  Code Status: Full code Family Communication: Patient and/or RN. Available if any question.  Level of care: Med-Surg Status is: Inpatient  Remains inpatient appropriate because:Ongoing diagnostic testing needed not appropriate for outpatient work up, IV treatments appropriate due to intensity of illness or inability to take PO and Inpatient level of care appropriate due to severity of illness   Dispo: The patient is from: Home              Anticipated d/c is to: Home              Patient currently is not medically stable to d/c.   Difficult to place patient No       Consultants:  Podiatry   Sch Meds:  Scheduled Meds: . amLODipine  10 mg Oral Daily  . aspirin EC  81 mg Oral Daily  . atorvastatin  40 mg Oral QPM  . COVID-19 mRNA vaccine (Moderna)  0.5 mL Intramuscular ONCE-1600  . folic acid  1 mg Oral Daily  . heparin  5,000 Units Subcutaneous Q8H  . hydrALAZINE  50 mg Oral Q8H  . insulin aspart  0-15 Units Subcutaneous TID WC  . insulin aspart  0-5 Units Subcutaneous QHS  . lisinopril  40 mg Oral QHS  . multivitamin with minerals  1 tablet Oral Daily  . thiamine  100 mg Oral Daily   Or  . thiamine  100 mg Intravenous Daily  . vancomycin variable dose per unstable renal  function (pharmacist dosing)   Does not apply See admin instructions   Continuous Infusions: . sodium chloride 125 mL/hr at 03/29/21 0100  . ceFEPime (MAXIPIME) IV    . metronidazole    . vancomycin 2,000 mg (03/29/21 0953)   PRN Meds:.acetaminophen **OR** acetaminophen, LORazepam **OR** LORazepam, ondansetron **OR** ondansetron (ZOFRAN) IV, oxyCODONE, polyethylene glycol  Antimicrobials: Anti-infectives (From admission, onward)   Start     Dose/Rate Route Frequency Ordered Stop   03/29/21 1145  ceFEPIme (MAXIPIME) 2 g in sodium chloride 0.9 % 100 mL IVPB        2 g 200 mL/hr over 30 Minutes Intravenous Every 8 hours 03/29/21 1050     03/29/21 1130  metroNIDAZOLE (FLAGYL) IVPB 500 mg        500 mg 100 mL/hr over 60 Minutes Intravenous Every 8 hours 03/29/21 1035     03/29/21 1101  vancomycin variable dose per unstable renal function (pharmacist dosing)         Does not apply See  admin instructions 03/29/21 1105     03/28/21 1900  cefTRIAXone (ROCEPHIN) 2 g in sodium chloride 0.9 % 100 mL IVPB  Status:  Discontinued        2 g 200 mL/hr over 30 Minutes Intravenous Every 24 hours 03/27/21 2129 03/29/21 1035   03/28/21 1800  vancomycin (VANCOREADY) IVPB 2000 mg/400 mL        2,000 mg 200 mL/hr over 120 Minutes Intravenous Every 12 hours 03/28/21 0922 03/29/21 1300   03/28/21 0600  vancomycin (VANCOREADY) IVPB 1250 mg/250 mL  Status:  Discontinued        1,250 mg 166.7 mL/hr over 90 Minutes Intravenous Every 12 hours 03/27/21 2118 03/28/21 0922   03/27/21 1800  vancomycin (VANCOREADY) IVPB 2000 mg/400 mL        2,000 mg 200 mL/hr over 120 Minutes Intravenous  Once 03/27/21 1737 03/27/21 2132   03/27/21 1730  vancomycin (VANCOCIN) IVPB 1000 mg/200 mL premix  Status:  Discontinued        1,000 mg 200 mL/hr over 60 Minutes Intravenous  Once 03/27/21 1720 03/27/21 1737   03/27/21 1730  cefTRIAXone (ROCEPHIN) 2 g in sodium chloride 0.9 % 100 mL IVPB        2 g 200 mL/hr over 30 Minutes  Intravenous  Once 03/27/21 1720 03/27/21 2132       I have personally reviewed the following labs and images: CBC: Recent Labs  Lab 03/27/21 1805  WBC 7.6  NEUTROABS 6.2  HGB 12.4*  HCT 38.1*  MCV 88.6  PLT 218   BMP &GFR Recent Labs  Lab 03/27/21 1805 03/28/21 0655  NA 131*  --   K 3.6  --   CL 96*  --   CO2 24  --   GLUCOSE 176*  --   BUN 17  --   CREATININE 1.08  --   CALCIUM 8.7*  --   MG  --  1.8   Estimated Creatinine Clearance: 110.1 mL/min (by C-G formula based on SCr of 1.08 mg/dL). Liver & Pancreas: Recent Labs  Lab 03/27/21 1805  AST 67*  ALT 29  ALKPHOS 93  BILITOT 0.7  PROT 8.6*  ALBUMIN 3.1*   No results for input(s): LIPASE, AMYLASE in the last 168 hours. No results for input(s): AMMONIA in the last 168 hours. Diabetic: No results for input(s): HGBA1C in the last 72 hours. Recent Labs  Lab 03/28/21 1111 03/28/21 1641 03/28/21 2136 03/29/21 0645 03/29/21 1124  GLUCAP 242* 159* 155* 140* 151*   Cardiac Enzymes: No results for input(s): CKTOTAL, CKMB, CKMBINDEX, TROPONINI in the last 168 hours. No results for input(s): PROBNP in the last 8760 hours. Coagulation Profile: Recent Labs  Lab 03/27/21 1805  INR 1.1   Thyroid Function Tests: No results for input(s): TSH, T4TOTAL, FREET4, T3FREE, THYROIDAB in the last 72 hours. Lipid Profile: No results for input(s): CHOL, HDL, LDLCALC, TRIG, CHOLHDL, LDLDIRECT in the last 72 hours. Anemia Panel: No results for input(s): VITAMINB12, FOLATE, FERRITIN, TIBC, IRON, RETICCTPCT in the last 72 hours. Urine analysis:    Component Value Date/Time   COLORURINE YELLOW 06/21/2020 1712   APPEARANCEUR CLEAR 06/21/2020 1712   LABSPEC 1.014 06/21/2020 1712   PHURINE 5.0 06/21/2020 1712   GLUCOSEU 150 (A) 06/21/2020 1712   HGBUR LARGE (A) 06/21/2020 1712   BILIRUBINUR NEGATIVE 06/21/2020 1712   KETONESUR NEGATIVE 06/21/2020 1712   PROTEINUR 100 (A) 06/21/2020 1712   UROBILINOGEN 4.0 (H)  10/09/2008 1114   NITRITE POSITIVE (A)  06/21/2020 1712   LEUKOCYTESUR NEGATIVE 06/21/2020 1712   Sepsis Labs: Invalid input(s): PROCALCITONIN, [place]  Microbiology: Recent Results (from the past 240 hour(s))  Blood Culture (routine x 2)     Status: None (Preliminary result)   Collection Time: 03/27/21  6:05 PM   Specimen: Right Antecubital; Blood  Result Value Ref Range Status   Specimen Description   Final    RIGHT ANTECUBITAL Performed at [facility], [address], [place], [place] [address]    Special Requests   Final    Blood Culture adequate volume BOTTLES DRAWN AEROBIC AND ANAEROBIC Performed at [facility], [address], [place], [place] [address]    Culture  Setup Time   Final    GRAM POSITIVE COCCI Gram Stain Report Called to,Read Back By and Verified With: RACHEAL TATE,RN @1154  03/28/2021 KAY AEROBIC BOTTLE ONLY CRITICAL RESULT CALLED TO, READ BACK BY AND VERIFIED WITH: VICTORIA BASS RN @1627  03/28/21 EB ANAEROBIC BOTTLE ONLY GRAM POSITIVE COCCI Gram Stain Report Called to,Read Back By and Verified With: PREVIOUSLY CALLED @1154  03/28/2021 TO RACHEAL TATE,RN Performed at [facility], [place] [address] [place], [place] [address]    Culture GRAM POSITIVE COCCI  Final   Report Status PENDING  Incomplete  Blood Culture ID Panel (Reflexed)     Status: Abnormal   Collection Time: 03/27/21  6:05 PM  Result Value Ref Range Status   Enterococcus faecalis NOT DETECTED NOT DETECTED Final   Enterococcus Faecium NOT DETECTED NOT DETECTED Final   Listeria monocytogenes NOT DETECTED NOT DETECTED Final   Staphylococcus species DETECTED (A) NOT DETECTED Final    Comment: CRITICAL RESULT CALLED TO, READ BACK BY AND VERIFIED WITH: VICTORIA BASS RN @1627  03/28/21 EB    Staphylococcus aureus (BCID) NOT DETECTED NOT DETECTED Final   Staphylococcus epidermidis NOT DETECTED NOT DETECTED Final   Staphylococcus lugdunensis NOT DETECTED NOT DETECTED Final   Streptococcus  species NOT DETECTED NOT DETECTED Final   Streptococcus agalactiae NOT DETECTED NOT DETECTED Final   Streptococcus pneumoniae NOT DETECTED NOT DETECTED Final   Streptococcus pyogenes NOT DETECTED NOT DETECTED Final   A.calcoaceticus-baumannii NOT DETECTED NOT DETECTED Final   Bacteroides fragilis NOT DETECTED NOT DETECTED Final   Enterobacterales NOT DETECTED NOT DETECTED Final   Enterobacter cloacae complex NOT DETECTED NOT DETECTED Final   Escherichia coli NOT DETECTED NOT DETECTED Final   Klebsiella aerogenes NOT DETECTED NOT DETECTED Final   Klebsiella oxytoca NOT DETECTED NOT DETECTED Final   Klebsiella pneumoniae NOT DETECTED NOT DETECTED Final   Proteus species NOT DETECTED NOT DETECTED Final   Salmonella species NOT DETECTED NOT DETECTED Final   Serratia marcescens NOT DETECTED NOT DETECTED Final   Haemophilus influenzae NOT DETECTED NOT DETECTED Final   Neisseria meningitidis NOT DETECTED NOT DETECTED Final   Pseudomonas aeruginosa NOT DETECTED NOT DETECTED Final   Stenotrophomonas maltophilia NOT DETECTED NOT DETECTED Final   Candida albicans NOT DETECTED NOT DETECTED Final   Candida auris NOT DETECTED NOT DETECTED Final   Candida glabrata NOT DETECTED NOT DETECTED Final   Candida krusei NOT DETECTED NOT DETECTED Final   Candida parapsilosis NOT DETECTED NOT DETECTED Final   Candida tropicalis NOT DETECTED NOT DETECTED Final   Cryptococcus neoformans/gattii NOT DETECTED NOT DETECTED Final    Comment: Performed at [facility], [address], [place], [place] [address]  Resp Panel by RT-PCR (Flu A&B, Covid) Nasopharyngeal Swab     Status: None   Collection Time: 03/27/21  6:07  PM   Specimen: Nasopharyngeal Swab; Nasopharyngeal(NP) swabs in vial transport medium  Result Value Ref Range Status   SARS Coronavirus 2 by RT PCR NEGATIVE NEGATIVE Final    Comment: (NOTE) SARS-CoV-2 target nucleic acids are NOT DETECTED.  The SARS-CoV-2 RNA is generally detectable in upper  respiratory specimens during the acute phase of infection. The lowest concentration of SARS-CoV-2 viral copies this assay can detect is 138 copies/mL. A negative result does not preclude SARS-Cov-2 infection and should not be used as the sole basis for treatment or other patient management decisions. A negative result may occur with  improper specimen collection/handling, submission of specimen other than nasopharyngeal swab, presence of viral mutation(s) within the areas targeted by this assay, and inadequate number of viral copies(<138 copies/mL). A negative result must be combined with clinical observations, patient history, and epidemiological information. The expected result is Negative.  Fact Sheet for Patients:  EntrepreneurPulse.com.au  Fact Sheet for Healthcare Providers:  IncredibleEmployment.be  This test is no t yet approved or cleared by the [place] FDA and  has been authorized for detection and/or diagnosis of SARS-CoV-2 by FDA under an Emergency Use Authorization (EUA). This EUA will remain  in effect (meaning this test can be used) for the duration of the COVID-19 declaration under Section 564(b)(1) of the Act, 21 U.S.C.section 360bbb-3(b)(1), unless the authorization is terminated  or revoked sooner.       Influenza A by PCR NEGATIVE NEGATIVE Final   Influenza B by PCR NEGATIVE NEGATIVE Final    Comment: (NOTE) The Xpert Xpress SARS-CoV-2/FLU/RSV plus assay is intended as an aid in the diagnosis of influenza from Nasopharyngeal swab specimens and should not be used as a sole basis for treatment. Nasal washings and aspirates are unacceptable for Xpert Xpress SARS-CoV-2/FLU/RSV testing.  Fact Sheet for Patients: EntrepreneurPulse.com.au  Fact Sheet for Healthcare Providers: IncredibleEmployment.be  This test is not yet approved or cleared by the [place] FDA and has been  authorized for detection and/or diagnosis of SARS-CoV-2 by FDA under an Emergency Use Authorization (EUA). This EUA will remain in effect (meaning this test can be used) for the duration of the COVID-19 declaration under Section 564(b)(1) of the Act, 21 U.S.C. section 360bbb-3(b)(1), unless the authorization is terminated or revoked.  Performed at [facility], [address] [place], [place] [address]   Blood Culture (routine x 2)     Status: None (Preliminary result)   Collection Time: 03/27/21  6:20 PM   Specimen: BLOOD LEFT HAND  Result Value Ref Range Status   Specimen Description BLOOD LEFT HAND  Final   Special Requests   Final    Blood Culture adequate volume BOTTLES DRAWN AEROBIC AND ANAEROBIC   Culture   Final    NO GROWTH 2 DAYS Performed at [facility], [address], [place], [place] [address]    Report Status PENDING  Incomplete    Radiology Studies: MR FOOT RIGHT W WO CONTRAST  Result Date: 03/29/2021 CLINICAL DATA:  History of diabetes.  Right great toe swelling. EXAM: MRI OF THE RIGHT FOREFOOT WITHOUT AND WITH CONTRAST TECHNIQUE: Multiplanar, multisequence MR imaging of the right forefoot was performed before and after the administration of intravenous contrast. CONTRAST:  45mL GADAVIST GADOBUTROL 1 MMOL/ML IV SOLN COMPARISON:  None. FINDINGS: Significant patient motion degrades image quality limiting evaluation. Bones/Joint/Cartilage Soft tissue wound along the plantar aspect of the first MTP joint. Large first MTP joint effusion with synovial enhancement. Bone marrow edema in the first metatarsal  head, first proximal phalanx and medial hallux sesamoid. No acute fracture or dislocation. Normal alignment. 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 Mild osteoarthritis of the navicular-middle cuneiform joint. Ligaments Collateral ligaments are intact.  Lisfranc ligament is intact. Muscles and Tendons Flexor, peroneal and extensor compartment tendons are intact. Diffuse T2  hyperintense signal throughout the plantar musculature concerning for mild myositis. Complex peripherally enhancing fluid collection in the flexor hallucis brevis muscle measuring 1.6 x 1 x 2.9 cm most consistent with an abscess. Soft tissue Severe edema of the great toe extending into the dorsal forefoot consistent with cellulitis. Soft tissue emphysema within the great toe. Large soft tissue wound along the plantar lateral aspect of the first MTP joint. Complex fluid collection within the subcutaneous fat along the medial aspect of the first MTP joint measuring approximately 4.2 x 1 x 3.4 cm. IMPRESSION: 1. Large soft tissue wound along the plantar lateral aspect of the first MTP joint with severe cellulitis of the great toe extending into the dorsal aspect of the foot and soft tissue emphysema within the great toe. Abscess within the subcutaneous fat along the medial aspect of the first MTP joint measuring approximately 4.2 x 1 x 3.4 cm. Complex peripherally enhancing fluid collection in the flexor hallucis brevis muscle measuring 1.6 x 1 x 2.9 cm most consistent with an abscess. 2. Septic arthritis of the first MTP joint with osteomyelitis of the first metatarsal head, first proximal phalanx and medial hallux sesamoid. 3. Diffuse T2 hyperintense signal throughout the plantar musculature concerning for myositis. Electronically Signed   By: Kathreen Devoid   On: 03/29/2021 10:09   US ARTERIAL [name] (SCREENING LOWER EXTREMITY)  Result Date: 03/28/2021 CLINICAL DATA:  55 year old male with right toe ulceration. EXAM: NONINVASIVE PHYSIOLOGIC VASCULAR STUDY OF BILATERAL LOWER EXTREMITIES TECHNIQUE: Evaluation of both lower extremities were performed at rest, including calculation of ankle-brachial indices with single level Doppler, pressure and pulse volume recording. COMPARISON:  06/30/2015 FINDINGS: Right [name]:  0.98 Left [name]:  Not obtained. Right Lower Extremity: Monophasic waveforms at the level of the ankle. The  posterior tibial artery is noncompressible. IMPRESSION: Normal right lower extremity ankle brachial index of 0.98, previously 1.06, however the posterior tibial artery is noncompressible and there are monophasic waveforms in the posterior tibial and dorsalis pedis arteries, suggestive of spuriously elevated [name]. Ruthann Cancer, MD [facility] Electronically Signed   By: Ruthann Cancer MD   On: 03/28/2021 13:31      Lanis Storlie T. Brent  If 7PM-7AM, please contact night-coverage [email or web address] 03/29/2021, 11:37 AM

## 2021-03-29 NOTE — Anesthesia Procedure Notes (Signed)
Procedure Name: Intubation Date/Time: 03/29/2021 3:34 PM Performed by: Valda Favia, CRNA Pre-anesthesia Checklist: Patient identified, Emergency Drugs available, Suction available and Patient being monitored Patient Re-evaluated:Patient Re-evaluated prior to induction Oxygen Delivery Method: Circle System Utilized Preoxygenation: Pre-oxygenation with 100% oxygen Induction Type: IV induction Ventilation: Mask ventilation without difficulty and Oral airway inserted - appropriate to patient size Laryngoscope Size: Mac and 4 Grade View: Grade I Tube type: Oral Tube size: 7.5 mm Number of attempts: 1 Airway Equipment and Method: Stylet and Oral airway Placement Confirmation: ETT inserted through vocal cords under direct vision,  positive ETCO2 and breath sounds checked- equal and bilateral Secured at: 24 cm Tube secured with: Tape Dental Injury: Teeth and Oropharynx as per pre-operative assessment

## 2021-03-29 NOTE — H&P (Signed)
Anesthesia H&P Update: History and Physical Exam reviewed; patient is OK for planned anesthetic and procedure. ? ?

## 2021-03-29 NOTE — Progress Notes (Signed)
Pharmacy Antibiotic Note  Eugene Diaz is a 54 y.o. male admitted on 03/27/2021 with diabetic foot infection with abscess and osteomyelitis. Pharmacy has been consulted for Vancomycin and Cefepime dosing.  MRI on 5/30 showing osteomyelitis of the first metatarsal head.  General surgery following. Patient is currently on vancomycin 2000mg  IV q12 hours.   Random vancomycin level slightly elevated at 23. Last dose given this am around 10am, true trough likely less than 20. When calculating for AUC dosing current dose is very elevated >700. Will dose adjust, give dose tonight for AUC dose in range.   Plan: Change vancomycin to 1250 q12 hours Goal AUC 400-550. Expected AUC: 468 SCr used: 0.75  Check true pk/tr at steady state later this week for AUC dosing  Begin cefepime 2g IV q8 hours Flagyl 500mg  IV q8 hours per MD F/U cxs and clinical progress Monitor V/S, labs and levels as indicated  Height: 6\' 2"  (188 cm) Weight: 131.5 kg (290 lb) IBW/kg (Calculated) : 82.2  Temp (24hrs), Avg:98.3 F (36.8 C), Min:97.5 F (36.4 C), Max:99.1 F (37.3 C)  Recent Labs  Lab 03/27/21 1805 03/27/21 1953 03/29/21 1310 03/29/21 1750  WBC 7.6  --  9.0  --   CREATININE 1.08  --  0.75  --   LATICACIDVEN 1.5 0.8  --   --   VANCORANDOM  --   --   --  23    Estimated Creatinine Clearance: 148.6 mL/min (by C-G formula based on SCr of 0.75 mg/dL).    No Known Allergies  Antimicrobials this admission: Vancomycin 5/28 >>  Ceftriaxone 5/28 >> 5/30 Cefepime 5/30 >> Flagyl 5/30 >>   Microbiology results: 5/28 BCx: 1/4 bottles staph species 5/28 NCx: ngtd  Thank you for allowing pharmacy to be a part of this patient's care.  Erin Hearing PharmD., BCPS Clinical Pharmacist 03/29/2021 7:38 PM

## 2021-03-29 NOTE — Transfer of Care (Signed)
Immediate Anesthesia Transfer of Care Note  Patient: Eugene Diaz  Procedure(s) Performed: AMPUTATION RAY (Right ) IRRIGATION AND DEBRIDEMENT EXTREMITY (Right )  Patient Location: PACU  Anesthesia Type:General  Level of Consciousness: awake, alert  and oriented  Airway & Oxygen Therapy: Patient Spontanous Breathing and Patient connected to nasal cannula oxygen  Post-op Assessment: Report given to RN and Post -op Vital signs reviewed and stable  Post vital signs: Reviewed and stable  Last Vitals:  Vitals Value Taken Time  BP 157/77 03/29/21 1631  Temp 36.4 C 03/29/21 1630  Pulse 73 03/29/21 1633  Resp 17 03/29/21 1633  SpO2 98 % 03/29/21 1633  Vitals shown include unvalidated device data.  Last Pain:  Vitals:   03/29/21 1100  TempSrc:   PainSc: 0-No pain         Complications: No complications documented.

## 2021-03-30 ENCOUNTER — Encounter (HOSPITAL_COMMUNITY): Payer: Self-pay | Admitting: Podiatry

## 2021-03-30 DIAGNOSIS — E1169 Type 2 diabetes mellitus with other specified complication: Secondary | ICD-10-CM | POA: Diagnosis not present

## 2021-03-30 DIAGNOSIS — E11621 Type 2 diabetes mellitus with foot ulcer: Secondary | ICD-10-CM

## 2021-03-30 DIAGNOSIS — G621 Alcoholic polyneuropathy: Secondary | ICD-10-CM | POA: Diagnosis not present

## 2021-03-30 DIAGNOSIS — L97415 Non-pressure chronic ulcer of right heel and midfoot with muscle involvement without evidence of necrosis: Secondary | ICD-10-CM

## 2021-03-30 DIAGNOSIS — E785 Hyperlipidemia, unspecified: Secondary | ICD-10-CM

## 2021-03-30 DIAGNOSIS — E1141 Type 2 diabetes mellitus with diabetic mononeuropathy: Secondary | ICD-10-CM | POA: Diagnosis not present

## 2021-03-30 DIAGNOSIS — E1152 Type 2 diabetes mellitus with diabetic peripheral angiopathy with gangrene: Secondary | ICD-10-CM

## 2021-03-30 DIAGNOSIS — F1010 Alcohol abuse, uncomplicated: Secondary | ICD-10-CM

## 2021-03-30 LAB — LIPID PANEL
Cholesterol: 84 mg/dL (ref 0–200)
HDL: <10 mg/dL — ABNORMAL LOW (ref >40)
Total CHOL/HDL Ratio: UNDETERMINED RATIO
Triglycerides: 182 mg/dL — ABNORMAL HIGH (ref <150)
VLDL: 36 mg/dL (ref 0–40)

## 2021-03-30 LAB — CBC
HCT: 26.1 % — ABNORMAL LOW (ref 39.0–52.0)
Hemoglobin: 8.5 g/dL — ABNORMAL LOW (ref 13.0–17.0)
MCH: 29 pg (ref 26.0–34.0)
MCHC: 32.6 g/dL (ref 30.0–36.0)
MCV: 89.1 fL (ref 80.0–100.0)
Platelets: 292 10*3/uL (ref 150–400)
RBC: 2.93 MIL/uL — ABNORMAL LOW (ref 4.22–5.81)
RDW: 13.1 % (ref 11.5–15.5)
WBC: 9.2 10*3/uL (ref 4.0–10.5)
nRBC: 0 % (ref 0.0–0.2)

## 2021-03-30 LAB — RENAL FUNCTION PANEL
Albumin: 2 g/dL — ABNORMAL LOW (ref 3.5–5.0)
Anion gap: 7 (ref 5–15)
BUN: 9 mg/dL (ref 6–20)
CO2: 19 mmol/L — ABNORMAL LOW (ref 22–32)
Calcium: 8.1 mg/dL — ABNORMAL LOW (ref 8.9–10.3)
Chloride: 108 mmol/L (ref 98–111)
Creatinine, Ser: 0.81 mg/dL (ref 0.61–1.24)
GFR, Estimated: >60 mL/min (ref >60)
Glucose, Bld: 131 mg/dL — ABNORMAL HIGH (ref 70–99)
Phosphorus: 3 mg/dL (ref 2.5–4.6)
Potassium: 3.5 mmol/L (ref 3.5–5.1)
Sodium: 134 mmol/L — ABNORMAL LOW (ref 135–145)

## 2021-03-30 LAB — CULTURE, BLOOD (ROUTINE X 2): Special Requests: ADEQUATE

## 2021-03-30 LAB — HEMOGLOBIN A1C
Hgb A1c MFr Bld: 6.2 % — ABNORMAL HIGH (ref 4.8–5.6)
Hgb A1c MFr Bld: 6.3 % — ABNORMAL HIGH (ref 4.8–5.6)
Mean Plasma Glucose: 131 mg/dL
Mean Plasma Glucose: 134 mg/dL

## 2021-03-30 LAB — MAGNESIUM: Magnesium: 1.8 mg/dL (ref 1.7–2.4)

## 2021-03-30 LAB — GLUCOSE, CAPILLARY
Glucose-Capillary: 126 mg/dL — ABNORMAL HIGH (ref 70–99)
Glucose-Capillary: 249 mg/dL — ABNORMAL HIGH (ref 70–99)
Glucose-Capillary: 179 mg/dL — ABNORMAL HIGH (ref 70–99)
Glucose-Capillary: 136 mg/dL — ABNORMAL HIGH (ref 70–99)

## 2021-03-30 MED ORDER — INSULIN ASPART 100 UNIT/ML IJ SOLN: 0.0000 [IU] | Freq: Three times a day (TID) | INTRAMUSCULAR | Status: DC

## 2021-03-30 MED ORDER — INSULIN ASPART 100 UNIT/ML IJ SOLN
0.0000 [IU] | Freq: Three times a day (TID) | INTRAMUSCULAR | Status: DC
  Administered 2021-03-30: 3 [IU] via SUBCUTANEOUS
  Administered 2021-03-31 – 2021-04-01 (×4): 2 [IU] via SUBCUTANEOUS
  Administered 2021-04-01: 5 [IU] via SUBCUTANEOUS
  Administered 2021-04-02: 3 [IU] via SUBCUTANEOUS
  Administered 2021-04-02: 2 [IU] via SUBCUTANEOUS

## 2021-03-30 MED ORDER — INSULIN ASPART 100 UNIT/ML IJ SOLN
4.0000 [IU] | Freq: Three times a day (TID) | INTRAMUSCULAR | Status: DC
  Administered 2021-03-30 – 2021-04-02 (×5): 4 [IU] via SUBCUTANEOUS

## 2021-03-30 MED ORDER — INSULIN ASPART 100 UNIT/ML IJ SOLN
0.0000 [IU] | Freq: Every day | INTRAMUSCULAR | Status: DC
  Administered 2021-03-31: 3 [IU] via SUBCUTANEOUS
  Administered 2021-04-01: 2 [IU] via SUBCUTANEOUS

## 2021-03-30 MED ORDER — INSULIN ASPART 100 UNIT/ML IJ SOLN: 4.0000 [IU] | Freq: Three times a day (TID) | INTRAMUSCULAR | Status: DC

## 2021-03-30 NOTE — Progress Notes (Signed)
Subjective: No new complaints   Antibiotics:  Anti-infectives (From admission, onward)   Start     Dose/Rate Route Frequency Ordered Stop   03/29/21 2200  vancomycin (VANCOREADY) IVPB 1250 mg/250 mL        1,250 mg 166.7 mL/hr over 90 Minutes Intravenous Every 12 hours 03/29/21 1939     03/29/21 1513  vancomycin (VANCOCIN) powder  Status:  Discontinued          As needed 03/29/21 1514 03/29/21 1624   03/29/21 1145  ceFEPIme (MAXIPIME) 2 g in sodium chloride 0.9 % 100 mL IVPB        2 g 200 mL/hr over 30 Minutes Intravenous Every 8 hours 03/29/21 1050     03/29/21 1130  metroNIDAZOLE (FLAGYL) IVPB 500 mg        500 mg 100 mL/hr over 60 Minutes Intravenous Every 8 hours 03/29/21 1035     03/29/21 1101  vancomycin variable dose per unstable renal function (pharmacist dosing)  Status:  Discontinued         Does not apply See admin instructions 03/29/21 1105 03/29/21 1941   03/28/21 1900  cefTRIAXone (ROCEPHIN) 2 g in sodium chloride 0.9 % 100 mL IVPB  Status:  Discontinued        2 g 200 mL/hr over 30 Minutes Intravenous Every 24 hours 03/27/21 2129 03/29/21 1035   03/28/21 1800  vancomycin (VANCOREADY) IVPB 2000 mg/400 mL        2,000 mg 200 mL/hr over 120 Minutes Intravenous Every 12 hours 03/28/21 0922 03/29/21 1153   03/28/21 0600  vancomycin (VANCOREADY) IVPB 1250 mg/250 mL  Status:  Discontinued        1,250 mg 166.7 mL/hr over 90 Minutes Intravenous Every 12 hours 03/27/21 2118 03/28/21 0922   03/27/21 1800  vancomycin (VANCOREADY) IVPB 2000 mg/400 mL        2,000 mg 200 mL/hr over 120 Minutes Intravenous  Once 03/27/21 1737 03/27/21 2132   03/27/21 1730  vancomycin (VANCOCIN) IVPB 1000 mg/200 mL premix  Status:  Discontinued        1,000 mg 200 mL/hr over 60 Minutes Intravenous  Once 03/27/21 1720 03/27/21 1737   03/27/21 1730  cefTRIAXone (ROCEPHIN) 2 g in sodium chloride 0.9 % 100 mL IVPB        2 g 200 mL/hr over 30 Minutes Intravenous  Once 03/27/21 1720  03/27/21 2132      Medications: Scheduled Meds: . amLODipine  10 mg Oral Daily  . aspirin EC  81 mg Oral Daily  . atorvastatin  40 mg Oral QPM  . folic acid  1 mg Oral Daily  . heparin  5,000 Units Subcutaneous Q8H  . hydrALAZINE  75 mg Oral Q8H  . insulin aspart  0-15 Units Subcutaneous TID WC  . insulin aspart  0-5 Units Subcutaneous QHS  . lisinopril  40 mg Oral QHS  . multivitamin with minerals  1 tablet Oral Daily  . thiamine  100 mg Oral Daily   Or  . thiamine  100 mg Intravenous Daily   Continuous Infusions: . ceFEPime (MAXIPIME) IV 2 g (03/30/21 1314)  . metronidazole 500 mg (03/30/21 1315)  . vancomycin 1,250 mg (03/30/21 0838)   PRN Meds:.acetaminophen **OR** acetaminophen, hydrALAZINE, LORazepam **OR** LORazepam, ondansetron **OR** ondansetron (ZOFRAN) IV, oxyCODONE, polyethylene glycol    Objective: Weight change:   Intake/Output Summary (Last 24 hours) at 03/30/2021 1608 Last data filed at 03/30/2021 1600 Gross per 24  hour  Intake 2158.19 ml  Output 3750 ml  Net -1591.81 ml   Blood pressure (!) 164/73, pulse 80, temperature 98.3 F (36.8 C), temperature source Oral, resp. rate 16, height 6\' 2"  (1.88 m), weight 131.5 kg, SpO2 99 %. Temp:  [97.5 F (36.4 C)-98.9 F (37.2 C)] 98.3 F (36.8 C) (05/31 0928) Pulse Rate:  [67-88] 80 (05/31 0928) Resp:  [15-18] 16 (05/31 0928) BP: (102-164)/(54-78) 164/73 (05/31 0928) SpO2:  [95 %-100 %] 99 % (05/31 0928)  Physical Exam: Physical Exam Constitutional:      Appearance: He is well-developed.  HENT:     Head: Normocephalic and atraumatic.  Eyes:     Conjunctiva/sclera: Conjunctivae normal.  Cardiovascular:     Rate and Rhythm: Normal rate and regular rhythm.  Pulmonary:     Effort: Pulmonary effort is normal. No respiratory distress.     Breath sounds: No wheezing.  Abdominal:     General: There is no distension.     Palpations: Abdomen is soft.  Musculoskeletal:     Cervical back: Normal range of  motion and neck supple.  Skin:    General: Skin is warm and dry.     Findings: No erythema or rash.  Neurological:     General: No focal deficit present.     Mental Status: He is alert and oriented to person, place, and time.  Psychiatric:        Mood and Affect: Mood normal.        Behavior: Behavior normal.        Thought Content: Thought content normal.        Judgment: Judgment normal.   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  Right foot is bandaged  CBC:    BMET Recent Labs    03/29/21 1310 03/30/21 0341  NA 134* 134*  K 3.1* 3.5  CL 107 108  CO2 22 19*  GLUCOSE 145* 131*  BUN 11 9  CREATININE 0.75 0.81  CALCIUM 8.3* 8.1*     Liver Panel  Recent Labs    03/27/21 1805 03/29/21 0210 03/29/21 1310 03/30/21 0341  PROT 8.6* 6.1*  --   --   ALBUMIN 3.1* 2.1* 2.2* 2.0*  AST 67* 49*  --   --   ALT 29 30  --   --   ALKPHOS 93 71  --   --   BILITOT 0.7 0.3  --   --        Sedimentation Rate Recent Labs    03/27/21 1954  ESRSEDRATE 108*   C-Reactive Protein Recent Labs    03/27/21 1953  CRP 19.7*    Micro Results: Recent Results (from the past 720 hour(s))  Blood Culture (routine x 2)     Status: Abnormal   Collection Time: 03/27/21  6:05 PM   Specimen: Right Antecubital; Blood  Result Value Ref Range Status   Specimen Description   Final    RIGHT ANTECUBITAL Performed at [facility], [address], [place], [place] [address]    Special Requests   Final    Blood Culture adequate volume BOTTLES DRAWN AEROBIC AND ANAEROBIC Performed at [facility], [address] [place], [place] [address]    Culture  Setup Time   Final    GRAM POSITIVE COCCI Gram Stain Report Called to,Read Back By and Verified With: RACHEAL TATE,RN @1154  03/28/2021 KAY AEROBIC BOTTLE ONLY CRITICAL RESULT CALLED TO, READ BACK BY AND VERIFIED WITH: VICTORIA BASS RN @1627  03/28/21  EB ANAEROBIC BOTTLE ONLY GRAM POSITIVE COCCI Gram Stain Report Called to,Read Back  By and Verified With: PREVIOUSLY CALLED @1154  03/28/2021 TO RACHEAL TATE,RN    Culture (A)  Final    STAPHYLOCOCCUS HOMINIS THE SIGNIFICANCE OF ISOLATING THIS ORGANISM FROM A SINGLE SET OF BLOOD CULTURES WHEN MULTIPLE SETS ARE DRAWN IS UNCERTAIN. PLEASE NOTIFY THE MICROBIOLOGY DEPARTMENT WITHIN ONE WEEK IF SPECIATION AND SENSITIVITIES ARE REQUIRED. Performed at [facility], [place] [address] [place], [place] [address]    Report Status 03/30/2021 FINAL  Final  Blood Culture ID Panel (Reflexed)     Status: Abnormal   Collection Time: 03/27/21  6:05 PM  Result Value Ref Range Status   Enterococcus faecalis NOT DETECTED NOT DETECTED Final   Enterococcus Faecium NOT DETECTED NOT DETECTED Final   Listeria monocytogenes NOT DETECTED NOT DETECTED Final   Staphylococcus species DETECTED (A) NOT DETECTED Final    Comment: CRITICAL RESULT CALLED TO, READ BACK BY AND VERIFIED WITH: VICTORIA BASS RN @1627  03/28/21 EB    Staphylococcus aureus (BCID) NOT DETECTED NOT DETECTED Final   Staphylococcus epidermidis NOT DETECTED NOT DETECTED Final   Staphylococcus lugdunensis NOT DETECTED NOT DETECTED Final   Streptococcus species NOT DETECTED NOT DETECTED Final   Streptococcus agalactiae NOT DETECTED NOT DETECTED Final   Streptococcus pneumoniae NOT DETECTED NOT DETECTED Final   Streptococcus pyogenes NOT DETECTED NOT DETECTED Final   A.calcoaceticus-baumannii NOT DETECTED NOT DETECTED Final   Bacteroides fragilis NOT DETECTED NOT DETECTED Final   Enterobacterales NOT DETECTED NOT DETECTED Final   Enterobacter cloacae complex NOT DETECTED NOT DETECTED Final   Escherichia coli NOT DETECTED NOT DETECTED Final   Klebsiella aerogenes NOT DETECTED NOT DETECTED Final   Klebsiella oxytoca NOT DETECTED NOT DETECTED Final   Klebsiella pneumoniae NOT DETECTED NOT DETECTED Final   Proteus species NOT DETECTED NOT DETECTED Final   Salmonella species NOT DETECTED NOT DETECTED Final   Serratia marcescens NOT  DETECTED NOT DETECTED Final   Haemophilus influenzae NOT DETECTED NOT DETECTED Final   Neisseria meningitidis NOT DETECTED NOT DETECTED Final   Pseudomonas aeruginosa NOT DETECTED NOT DETECTED Final   Stenotrophomonas maltophilia NOT DETECTED NOT DETECTED Final   Candida albicans NOT DETECTED NOT DETECTED Final   Candida auris NOT DETECTED NOT DETECTED Final   Candida glabrata NOT DETECTED NOT DETECTED Final   Candida krusei NOT DETECTED NOT DETECTED Final   Candida parapsilosis NOT DETECTED NOT DETECTED Final   Candida tropicalis NOT DETECTED NOT DETECTED Final   Cryptococcus neoformans/gattii NOT DETECTED NOT DETECTED Final    Comment: Performed at [facility], [address], [place], [place] [address]  Resp Panel by RT-PCR (Flu A&B, Covid) Nasopharyngeal Swab     Status: None   Collection Time: 03/27/21  6:07 PM   Specimen: Nasopharyngeal Swab; Nasopharyngeal(NP) swabs in vial transport medium  Result Value Ref Range Status   SARS Coronavirus 2 by RT PCR NEGATIVE NEGATIVE Final    Comment: (NOTE) SARS-CoV-2 target nucleic acids are NOT DETECTED.  The SARS-CoV-2 RNA is generally detectable in upper respiratory specimens during the acute phase of infection. The lowest concentration of SARS-CoV-2 viral copies this assay can detect is 138 copies/mL. A negative result does not preclude SARS-Cov-2 infection and should not be used as the sole basis for treatment or other patient management decisions. A negative result may occur with  improper specimen collection/handling, submission of specimen other than nasopharyngeal swab, presence of viral mutation(s) within the areas targeted by  this assay, and inadequate number of viral copies(<138 copies/mL). A negative result must be combined with clinical observations, patient history, and epidemiological information. The expected result is Negative.  Fact Sheet for Patients:  EntrepreneurPulse.com.au  Fact Sheet for  Healthcare Providers:  IncredibleEmployment.be  This test is no t yet approved or cleared by the [place] FDA and  has been authorized for detection and/or diagnosis of SARS-CoV-2 by FDA under an Emergency Use Authorization (EUA). This EUA will remain  in effect (meaning this test can be used) for the duration of the COVID-19 declaration under Section 564(b)(1) of the Act, 21 U.S.C.section 360bbb-3(b)(1), unless the authorization is terminated  or revoked sooner.       Influenza A by PCR NEGATIVE NEGATIVE Final   Influenza B by PCR NEGATIVE NEGATIVE Final    Comment: (NOTE) The Xpert Xpress SARS-CoV-2/FLU/RSV plus assay is intended as an aid in the diagnosis of influenza from Nasopharyngeal swab specimens and should not be used as a sole basis for treatment. Nasal washings and aspirates are unacceptable for Xpert Xpress SARS-CoV-2/FLU/RSV testing.  Fact Sheet for Patients: EntrepreneurPulse.com.au  Fact Sheet for Healthcare Providers: IncredibleEmployment.be  This test is not yet approved or cleared by the [place] FDA and has been authorized for detection and/or diagnosis of SARS-CoV-2 by FDA under an Emergency Use Authorization (EUA). This EUA will remain in effect (meaning this test can be used) for the duration of the COVID-19 declaration under Section 564(b)(1) of the Act, 21 U.S.C. section 360bbb-3(b)(1), unless the authorization is terminated or revoked.  Performed at [facility], [address], [place], [place] [address]   Blood Culture (routine x 2)     Status: None (Preliminary result)   Collection Time: 03/27/21  6:20 PM   Specimen: BLOOD LEFT HAND  Result Value Ref Range Status   Specimen Description BLOOD LEFT HAND  Final   Special Requests   Final    Blood Culture adequate volume BOTTLES DRAWN AEROBIC AND ANAEROBIC   Culture   Final    NO GROWTH 3 DAYS Performed at [facility], [address], [place], [place] [address]    Report Status PENDING  Incomplete  Culture, blood (Routine X 2) w Reflex to ID Panel     Status: None (Preliminary result)   Collection Time: 03/29/21  2:00 AM   Specimen: BLOOD RIGHT HAND  Result Value Ref Range Status   Specimen Description BLOOD RIGHT HAND  Final   Special Requests   Final    BOTTLES DRAWN AEROBIC AND ANAEROBIC Blood Culture adequate volume   Culture   Final    NO GROWTH 1 DAY Performed at [facility], [place] [address], [place], [place] [address]    Report Status PENDING  Incomplete  Culture, blood (Routine X 2) w Reflex to ID Panel     Status: None (Preliminary result)   Collection Time: 03/29/21  2:08 AM   Specimen: BLOOD LEFT HAND  Result Value Ref Range Status   Specimen Description BLOOD LEFT HAND  Final   Special Requests   Final    BOTTLES DRAWN AEROBIC AND ANAEROBIC Blood Culture adequate volume   Culture   Final    NO GROWTH 1 DAY Performed at [facility], [place] [address], [place], [place] [address]    Report Status PENDING  Incomplete  Aerobic/Anaerobic Culture w Gram Stain (surgical/deep wound)     Status: None (Preliminary result)   Collection Time: 03/29/21  3:50 PM  Specimen: PATH Soft tissue  Result Value Ref Range Status   Specimen Description TISSUE RIGHT TOE  Final   Special Requests RT GREAT TOE SWAB  Final   Gram Stain   Final    NO WBC SEEN MODERATE GRAM POSITIVE COCCI RARE GRAM NEGATIVE RODS    Culture   Final    ABUNDANT GRAM NEGATIVE RODS IDENTIFICATION AND SUSCEPTIBILITIES TO FOLLOW MODERATE STREPTOCOCCUS AGALACTIAE TESTING AGAINST S. AGALACTIAE NOT ROUTINELY PERFORMED DUE TO PREDICTABILITY OF AMP/PEN/VAN SUSCEPTIBILITY. Performed at [facility], [place] [address] [place], [place] [address]    Report Status PENDING  Incomplete  Aerobic/Anaerobic Culture w Gram Stain (surgical/deep wound)     Status: None (Preliminary result)   Collection Time: 03/29/21  4:04 PM   Specimen:  Soft Tissue, Other  Result Value Ref Range Status   Specimen Description TISSUE  Final   Special Requests RT 1ST METATARSAL  Final   Gram Stain NO WBC SEEN NO ORGANISMS SEEN   Final   Culture   Final    NO GROWTH < 24 HOURS Performed at [facility], [place] [address], [place], [place] [address]    Report Status PENDING  Incomplete    Studies/Results: MR FOOT RIGHT W WO CONTRAST  Result Date: 03/29/2021 CLINICAL DATA:  History of diabetes.  Right great toe swelling. EXAM: MRI OF THE RIGHT FOREFOOT WITHOUT AND WITH CONTRAST TECHNIQUE: Multiplanar, multisequence MR imaging of the right forefoot was performed before and after the administration of intravenous contrast. CONTRAST:  16mL GADAVIST GADOBUTROL 1 MMOL/ML IV SOLN COMPARISON:  None. FINDINGS: Significant patient motion degrades image quality limiting evaluation. Bones/Joint/Cartilage Soft tissue wound along the plantar aspect of the first MTP joint. Large first MTP joint effusion with synovial enhancement. Bone marrow edema in the first metatarsal head, first proximal phalanx and medial hallux sesamoid. No acute fracture or dislocation. Normal alignment. 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 Mild osteoarthritis of the navicular-middle cuneiform joint. Ligaments Collateral ligaments are intact.  Lisfranc ligament is intact. Muscles and Tendons Flexor, peroneal and extensor compartment tendons are intact. Diffuse T2 hyperintense signal throughout the plantar musculature concerning for mild myositis. Complex peripherally enhancing fluid collection in the flexor hallucis brevis muscle measuring 1.6 x 1 x 2.9 cm most consistent with an abscess. Soft tissue Severe edema of the great toe extending into the dorsal forefoot consistent with cellulitis. Soft tissue emphysema within the great toe. Large soft tissue wound along the plantar lateral aspect of the first MTP joint. Complex fluid collection within the subcutaneous fat along the medial  aspect of the first MTP joint measuring approximately 4.2 x 1 x 3.4 cm. IMPRESSION: 1. Large soft tissue wound along the plantar lateral aspect of the first MTP joint with severe cellulitis of the great toe extending into the dorsal aspect of the foot and soft tissue emphysema within the great toe. Abscess within the subcutaneous fat along the medial aspect of the first MTP joint measuring approximately 4.2 x 1 x 3.4 cm. Complex peripherally enhancing fluid collection in the flexor hallucis brevis muscle measuring 1.6 x 1 x 2.9 cm most consistent with an abscess. 2. Septic arthritis of the first MTP joint with osteomyelitis of the first metatarsal head, first proximal phalanx and medial hallux sesamoid. 3. Diffuse T2 hyperintense signal throughout the plantar musculature concerning for myositis. Electronically Signed   By: Kathreen Devoid   On: 03/29/2021 10:09   DG Foot 2 Views Right  Result Date: 03/29/2021 CLINICAL  DATA:  Status post amputation EXAM: RIGHT FOOT - 2 VIEW COMPARISON:  03/27/2021 FINDINGS: There has been interval transmetatarsal amputation of the first toe. No other amputation is seen. Irregularity in the soft tissues is noted related to the recent surgery. No other focal abnormality is noted. IMPRESSION: Interval first transmetatarsal amputation. Electronically Signed   By: Inez Catalina M.D.   On: 03/29/2021 17:46      Assessment/Plan:  INTERVAL HISTORY: Cultures from soft tissue right toe swab show moderate gram-positive cocci and rare gram-negative rods on gram stain with group B streptococcus and gram-negative rods isolated so far.     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 male  with extensive smoking history, alcohol abuse peripheral neuropathy, diabetes who is undergone left above-the-knee amputation, now with osteomyelitis involving his first metatarsal phalangeal tarsal head with an abscess along the flexor houses brevis muscle with gangrenous changes, status post surgery yesterday with I&D of foot abscess and partial first ray resection.  Intraoperative cultures are growing group B streptococci along with gram-negative   We will narrow to cefepime and metronidazole  Ideally I would like to send him home with parenteral antibiotics she claims to have done before but I am concerned about his heavy alcohol use and whether he would be able to be adherent to home IV antibiotics.  I spent greater than 35 minutes with the patient including greater than 50% of time in face to face counsel of the patient in review of his radiographs personally and in coordination of his care.      LOS: 3 days   Alcide Evener 03/30/2021, 4:08 PM

## 2021-03-30 NOTE — Progress Notes (Signed)
Subjective:  Patient ID: Eugene Diaz, male    DOB: 1965/12/24,  MRN: [id number]  Patient seen bedside. Denies pain in his foot. Does report pain in his tooth. Objective:   Vitals:   03/30/21 0540 03/30/21 0928  BP: (!) 102/54 (!) 164/73  Pulse: 88 80  Resp: 16 16  Temp: 98.8 F (37.1 C) 98.3 F (36.8 C)  SpO2: 100% 99%   Dressing c/d/i right foot. Remaining toes good color and warmth. No ascending cellulitis of the leg.  Recent Results (from the past 240 hour(s))  Blood Culture (routine x 2)     Status: Abnormal   Collection Time: 03/27/21  6:05 PM   Specimen: Right Antecubital; Blood  Result Value Ref Range Status   Specimen Description   Final    RIGHT ANTECUBITAL Performed at [facility], [address], [place], [place] [address]    Special Requests   Final    Blood Culture adequate volume BOTTLES DRAWN AEROBIC AND ANAEROBIC Performed at [facility], [address], [place], [place] [address]    Culture  Setup Time   Final    GRAM POSITIVE COCCI Gram Stain Report Called to,Read Back By and Verified With: RACHEAL TATE,RN @1154  03/28/2021 KAY AEROBIC BOTTLE ONLY CRITICAL RESULT CALLED TO, READ BACK BY AND VERIFIED WITH: VICTORIA BASS RN @1627  03/28/21 EB ANAEROBIC BOTTLE ONLY GRAM POSITIVE COCCI Gram Stain Report Called to,Read Back By and Verified With: PREVIOUSLY CALLED @1154  03/28/2021 TO RACHEAL TATE,RN    Culture (A)  Final    STAPHYLOCOCCUS HOMINIS THE SIGNIFICANCE OF ISOLATING THIS ORGANISM FROM A SINGLE SET OF BLOOD CULTURES WHEN MULTIPLE SETS ARE DRAWN IS UNCERTAIN. PLEASE NOTIFY THE MICROBIOLOGY DEPARTMENT WITHIN ONE WEEK IF SPECIATION AND SENSITIVITIES ARE REQUIRED. Performed at [facility], [place] [address] [place] [place], [place] [address]    Report Status 03/30/2021 FINAL  Final  Blood Culture ID Panel (Reflexed)     Status: Abnormal   Collection Time: 03/27/21  6:05 PM  Result Value Ref Range Status   Enterococcus faecalis NOT DETECTED NOT DETECTED  Final   Enterococcus Faecium NOT DETECTED NOT DETECTED Final   Listeria monocytogenes NOT DETECTED NOT DETECTED Final   Staphylococcus species DETECTED (A) NOT DETECTED Final    Comment: CRITICAL RESULT CALLED TO, READ BACK BY AND VERIFIED WITH: VICTORIA BASS RN @1627  03/28/21 EB    Staphylococcus aureus (BCID) NOT DETECTED NOT DETECTED Final   Staphylococcus epidermidis NOT DETECTED NOT DETECTED Final   Staphylococcus lugdunensis NOT DETECTED NOT DETECTED Final   Streptococcus species NOT DETECTED NOT DETECTED Final   Streptococcus agalactiae NOT DETECTED NOT DETECTED Final   Streptococcus pneumoniae NOT DETECTED NOT DETECTED Final   Streptococcus pyogenes NOT DETECTED NOT DETECTED Final   A.calcoaceticus-baumannii NOT DETECTED NOT DETECTED Final   Bacteroides fragilis NOT DETECTED NOT DETECTED Final   Enterobacterales NOT DETECTED NOT DETECTED Final   Enterobacter cloacae complex NOT DETECTED NOT DETECTED Final   Escherichia coli NOT DETECTED NOT DETECTED Final   Klebsiella aerogenes NOT DETECTED NOT DETECTED Final   Klebsiella oxytoca NOT DETECTED NOT DETECTED Final   Klebsiella pneumoniae NOT DETECTED NOT DETECTED Final   Proteus species NOT DETECTED NOT DETECTED Final   Salmonella species NOT DETECTED NOT DETECTED Final   Serratia marcescens NOT DETECTED NOT DETECTED Final   Haemophilus influenzae NOT DETECTED NOT DETECTED Final   Neisseria meningitidis NOT DETECTED NOT DETECTED Final   Pseudomonas aeruginosa NOT DETECTED NOT DETECTED Final   Stenotrophomonas maltophilia NOT  DETECTED NOT DETECTED Final   Candida albicans NOT DETECTED NOT DETECTED Final   Candida auris NOT DETECTED NOT DETECTED Final   Candida glabrata NOT DETECTED NOT DETECTED Final   Candida krusei NOT DETECTED NOT DETECTED Final   Candida parapsilosis NOT DETECTED NOT DETECTED Final   Candida tropicalis NOT DETECTED NOT DETECTED Final   Cryptococcus neoformans/gattii NOT DETECTED NOT DETECTED Final     Comment: Performed at [facility], [place] [address] [place], [place] [address]  Resp Panel by RT-PCR (Flu A&B, Covid) Nasopharyngeal Swab     Status: None   Collection Time: 03/27/21  6:07 PM   Specimen: Nasopharyngeal Swab; Nasopharyngeal(NP) swabs in vial transport medium  Result Value Ref Range Status   SARS Coronavirus 2 by RT PCR NEGATIVE NEGATIVE Final    Comment: (NOTE) SARS-CoV-2 target nucleic acids are NOT DETECTED.  The SARS-CoV-2 RNA is generally detectable in upper respiratory specimens during the acute phase of infection. The lowest concentration of SARS-CoV-2 viral copies this assay can detect is 138 copies/mL. A negative result does not preclude SARS-Cov-2 infection and should not be used as the sole basis for treatment or other patient management decisions. A negative result may occur with  improper specimen collection/handling, submission of specimen other than nasopharyngeal swab, presence of viral mutation(s) within the areas targeted by this assay, and inadequate number of viral copies(<138 copies/mL). A negative result must be combined with clinical observations, patient history, and epidemiological information. The expected result is Negative.  Fact Sheet for Patients:  EntrepreneurPulse.com.au  Fact Sheet for Healthcare Providers:  IncredibleEmployment.be  This test is no t yet approved or cleared by the [place] FDA and  has been authorized for detection and/or diagnosis of SARS-CoV-2 by FDA under an Emergency Use Authorization (EUA). This EUA will remain  in effect (meaning this test can be used) for the duration of the COVID-19 declaration under Section 564(b)(1) of the Act, 21 U.S.C.section 360bbb-3(b)(1), unless the authorization is terminated  or revoked sooner.       Influenza A by PCR NEGATIVE NEGATIVE Final   Influenza B by PCR NEGATIVE NEGATIVE Final    Comment: (NOTE) The Xpert Xpress  SARS-CoV-2/FLU/RSV plus assay is intended as an aid in the diagnosis of influenza from Nasopharyngeal swab specimens and should not be used as a sole basis for treatment. Nasal washings and aspirates are unacceptable for Xpert Xpress SARS-CoV-2/FLU/RSV testing.  Fact Sheet for Patients: EntrepreneurPulse.com.au  Fact Sheet for Healthcare Providers: IncredibleEmployment.be  This test is not yet approved or cleared by the [place] FDA and has been authorized for detection and/or diagnosis of SARS-CoV-2 by FDA under an Emergency Use Authorization (EUA). This EUA will remain in effect (meaning this test can be used) for the duration of the COVID-19 declaration under Section 564(b)(1) of the Act, 21 U.S.C. section 360bbb-3(b)(1), unless the authorization is terminated or revoked.  Performed at [facility], [address], [place], [place] [address]   Blood Culture (routine x 2)     Status: None (Preliminary result)   Collection Time: 03/27/21  6:20 PM   Specimen: BLOOD LEFT HAND  Result Value Ref Range Status   Specimen Description BLOOD LEFT HAND  Final   Special Requests   Final    Blood Culture adequate volume BOTTLES DRAWN AEROBIC AND ANAEROBIC   Culture   Final    NO GROWTH 3 DAYS Performed at [facility], [address] [place], [place] [address]    Report Status  PENDING  Incomplete  Culture, blood (Routine X 2) w Reflex to ID Panel     Status: None (Preliminary result)   Collection Time: 03/29/21  2:00 AM   Specimen: BLOOD RIGHT HAND  Result Value Ref Range Status   Specimen Description BLOOD RIGHT HAND  Final   Special Requests   Final    BOTTLES DRAWN AEROBIC AND ANAEROBIC Blood Culture adequate volume   Culture   Final    NO GROWTH 1 DAY Performed at [facility], [place] [address] [place], [place] [address]    Report Status PENDING  Incomplete  Culture, blood (Routine X 2) w Reflex to ID Panel     Status: None  (Preliminary result)   Collection Time: 03/29/21  2:08 AM   Specimen: BLOOD LEFT HAND  Result Value Ref Range Status   Specimen Description BLOOD LEFT HAND  Final   Special Requests   Final    BOTTLES DRAWN AEROBIC AND ANAEROBIC Blood Culture adequate volume   Culture   Final    NO GROWTH 1 DAY Performed at [facility], [place] [address] [place], [place] [address]    Report Status PENDING  Incomplete  Aerobic/Anaerobic Culture w Gram Stain (surgical/deep wound)     Status: None (Preliminary result)   Collection Time: 03/29/21  3:50 PM   Specimen: PATH Soft tissue  Result Value Ref Range Status   Specimen Description TISSUE RIGHT TOE  Final   Special Requests RT GREAT TOE SWAB  Final   Gram Stain   Final    NO WBC SEEN MODERATE GRAM POSITIVE COCCI RARE GRAM NEGATIVE RODS    Culture   Final    ABUNDANT GRAM NEGATIVE RODS IDENTIFICATION AND SUSCEPTIBILITIES TO FOLLOW MODERATE STREPTOCOCCUS AGALACTIAE TESTING AGAINST S. AGALACTIAE NOT ROUTINELY PERFORMED DUE TO PREDICTABILITY OF AMP/PEN/VAN SUSCEPTIBILITY. Performed at [facility], [place] [address], [place], [place] [address]    Report Status PENDING  Incomplete  Aerobic/Anaerobic Culture w Gram Stain (surgical/deep wound)     Status: None (Preliminary result)   Collection Time: 03/29/21  4:04 PM   Specimen: Soft Tissue, Other  Result Value Ref Range Status   Specimen Description TISSUE  Final   Special Requests RT 1ST METATARSAL  Final   Gram Stain NO WBC SEEN NO ORGANISMS SEEN   Final   Culture   Final    NO GROWTH < 24 HOURS Performed at [facility], [place] [address] [place], [place] [address]    Report Status PENDING  Incomplete    Assessment & Plan:  Patient was evaluated and treated and all questions answered.  Right foot Osteomyelitis -Dressing left intact today -Planned RTOR tomorrow for wound debridement and closure, possible further 1st metatarsal resection. -Abx per ID. -NPO after midnight  tonight -Will continue to follow.  Evelina Bucy, DPM  Accessible via secure chat for questions or concerns.

## 2021-03-30 NOTE — Progress Notes (Signed)
PROGRESS NOTE  Eugene Diaz FMB:[id number] DOB: 08-Nov-1965   PCP: [name], PA-C  Patient is from: Home  DOA: 03/27/2021 LOS: 3  Chief complaints: Left foot/wound infection  Brief Narrative / Interim history: 55 year old M with PMH of NIDDM-2, PVD, left BKA, neuropathy, chronic right foot ulcer, alcohol abuse, anxiety and depression [facility] with right great toe swelling, discoloration, malodorous discharge and worsening right foot ulcer over the ball of his right foot, and admitted for right foot osteomyelitis with right foot diabetic wound infection.  Cultures obtained.  He was a started on ceftriaxone and vancomycin.  X-ray concerning for osteomyelitis in right great toe.  He was evaluated by general surgery, and transferred to [facility] for evaluation and management by podiatry.  MRI of right foot consistent with right foot cellulitis, abscess and osteomyelitis of the right great toe.  Antibiotics broadened to cefepime, Flagyl and vancomycin.   Patient underwent I&D of right foot complex abscess and partial right first ray resection by [name] on 5/30.  Subjective: Seen and examined earlier this morning.  No major events overnight of this morning.  No complaints.  He denies pain in surgical foot.  Denies chest pain, dyspnea, GI or UTI symptoms.  Objective: Vitals:   03/29/21 2202 03/30/21 0450 03/30/21 0540 03/30/21 0928  BP: (!) 155/76 (!) 151/78 (!) 102/54 (!) 164/73  Pulse: 67 68 88 80  Resp: 16 18 16 16   Temp: 98.6 F (37 C) 98.9 F (37.2 C) 98.8 F (37.1 C) 98.3 F (36.8 C)  TempSrc: Oral Oral Oral Oral  SpO2: 99% 97% 100% 99%  Weight:      Height:        Intake/Output Summary (Last 24 hours) at 03/30/2021 1609 Last data filed at 03/30/2021 1600 Gross per 24 hour  Intake 2158.19 ml  Output 3750 ml  Net -1591.81 ml   Filed Weights   03/27/21 1656  Weight: 131.5 kg    Examination:  GENERAL: No apparent distress.  Nontoxic. HEENT: MMM.   Vision and hearing grossly intact.  NECK: Supple.  No apparent JVD.  RESP:  No IWOB.  Fair aeration bilaterally. CVS:  RRR. Heart sounds normal.  ABD/GI/GU: BS+. Abd soft, NTND.  MSK/EXT: Left BKA.  Ace wrap and dressing over right foot DCI. SKIN: Ace wrap and dressing over right foot. NEURO: Awake and alert. Oriented appropriately.  No apparent focal neuro deficit. PSYCH: Calm. Normal affect.  Procedures:  5/30-I&D of right foot complex abscess and partial right first ray resection by [name]   Microbiology summarized: 5/28-COVID-19 and influenza PCR nonreactive. 5/28-blood culture with staph species (not aureus) in 1 out of 2 bottles 5/30-blood cultures NGTD 5/30-surgical wound culture pending.    Assessment & Plan: Right foot cellulitis/abscess/osteomyelitis/diabetic wound infection-MRI right foot concerning for cellulitis, abscess and osteomyelitis.  Inflammatory markers elevated.  [name] with noncompressible PTA.  Culture data as above. S/p I&D and partial right first ray resection -Podiatry and infectious disease following -Continue vancomycin, cefepime and Flagyl -Plan for return to the OR in the next 2 to 3 days  Positive blood culture: Blood culture from 5/28 with staph species but not aureus in 1 out of 2 bottles which is likely contaminant.  Repeat blood culture NGTD -Antibiotics as above   Controlled NIDDM-2 with hyperglycemia, neuropathy and diabetic wound infection: A1c 6.3%. Recent Labs  Lab 03/29/21 1631 03/29/21 1806 03/29/21 2054 03/30/21 0743 03/30/21 1119  GLUCAP 126* 128* 186* 126* 249*  -Continue SSI-moderate -  Add NovoLog 4 units 3 times daily with meals -Continue statin, aspirin, Cymbalta and gabapentin  Uncontrolled hypertension: BP elevated but improved. -Discontinue IV fluid -Continue home lisinopril, amlodipine -Increased hydralazine from 50 to 75 mg 3 times daily on 5/30 -Hydralazine 25 mg p.o. as needed  PAD-[name] 0.98 but noncompressible PTA  with monophasic waveforms in both PT and DP.  Not a smoker. -Started on aspirin -Continue statin.  Left BKA due to osteomyelitis over a year ago-stable  Hyperlipidemia: Total cholesterol 84.  HDL <10.  -Continue statin  Alcohol abuse: Reportedly quit drinking about 2 weeks POA.  Heavy drinker before that.  No withdrawal symptoms. -Continue CIWA with as needed Ativan -Continue thiamine's  Anxiety and depression: Stable -Continue home medications   Morbid obesity Body mass index is 37.23 kg/m.  -Encourage lifestyle change to lose weight -Could benefit from GLP-1 inhibitors       DVT prophylaxis:  heparin injection 5,000 Units Start: 03/27/21 2200 SCDs Start: 03/27/21 2130  Code Status: Full code Family Communication: Patient and/or RN. Available if any question.  Level of care: Med-Surg Status is: Inpatient  Remains inpatient appropriate because:Ongoing diagnostic testing needed not appropriate for outpatient work up, IV treatments appropriate due to intensity of illness or inability to take PO and Inpatient level of care appropriate due to severity of illness   Dispo: The patient is from: Home              Anticipated d/c is to: Home              Patient currently is not medically stable to d/c.   Difficult to place patient No       Consultants:  Podiatry Infectious disease   Sch Meds:  Scheduled Meds: . amLODipine  10 mg Oral Daily  . aspirin EC  81 mg Oral Daily  . atorvastatin  40 mg Oral QPM  . folic acid  1 mg Oral Daily  . heparin  5,000 Units Subcutaneous Q8H  . hydrALAZINE  75 mg Oral Q8H  . insulin aspart  0-15 Units Subcutaneous TID WC  . insulin aspart  0-5 Units Subcutaneous QHS  . lisinopril  40 mg Oral QHS  . multivitamin with minerals  1 tablet Oral Daily  . thiamine  100 mg Oral Daily   Or  . thiamine  100 mg Intravenous Daily   Continuous Infusions: . ceFEPime (MAXIPIME) IV 2 g (03/30/21 1314)  . metronidazole 500 mg (03/30/21 1315)   . vancomycin 1,250 mg (03/30/21 0838)   PRN Meds:.acetaminophen **OR** acetaminophen, hydrALAZINE, LORazepam **OR** LORazepam, ondansetron **OR** ondansetron (ZOFRAN) IV, oxyCODONE, polyethylene glycol  Antimicrobials: Anti-infectives (From admission, onward)   Start     Dose/Rate Route Frequency Ordered Stop   03/29/21 2200  vancomycin (VANCOREADY) IVPB 1250 mg/250 mL        1,250 mg 166.7 mL/hr over 90 Minutes Intravenous Every 12 hours 03/29/21 1939     03/29/21 1513  vancomycin (VANCOCIN) powder  Status:  Discontinued          As needed 03/29/21 1514 03/29/21 1624   03/29/21 1145  ceFEPIme (MAXIPIME) 2 g in sodium chloride 0.9 % 100 mL IVPB        2 g 200 mL/hr over 30 Minutes Intravenous Every 8 hours 03/29/21 1050     03/29/21 1130  metroNIDAZOLE (FLAGYL) IVPB 500 mg        500 mg 100 mL/hr over 60 Minutes Intravenous Every 8 hours 03/29/21 1035  03/29/21 1101  vancomycin variable dose per unstable renal function (pharmacist dosing)  Status:  Discontinued         Does not apply See admin instructions 03/29/21 1105 03/29/21 1941   03/28/21 1900  cefTRIAXone (ROCEPHIN) 2 g in sodium chloride 0.9 % 100 mL IVPB  Status:  Discontinued        2 g 200 mL/hr over 30 Minutes Intravenous Every 24 hours 03/27/21 2129 03/29/21 1035   03/28/21 1800  vancomycin (VANCOREADY) IVPB 2000 mg/400 mL        2,000 mg 200 mL/hr over 120 Minutes Intravenous Every 12 hours 03/28/21 0922 03/29/21 1153   03/28/21 0600  vancomycin (VANCOREADY) IVPB 1250 mg/250 mL  Status:  Discontinued        1,250 mg 166.7 mL/hr over 90 Minutes Intravenous Every 12 hours 03/27/21 2118 03/28/21 0922   03/27/21 1800  vancomycin (VANCOREADY) IVPB 2000 mg/400 mL        2,000 mg 200 mL/hr over 120 Minutes Intravenous  Once 03/27/21 1737 03/27/21 2132   03/27/21 1730  vancomycin (VANCOCIN) IVPB 1000 mg/200 mL premix  Status:  Discontinued        1,000 mg 200 mL/hr over 60 Minutes Intravenous  Once 03/27/21 1720 03/27/21  1737   03/27/21 1730  cefTRIAXone (ROCEPHIN) 2 g in sodium chloride 0.9 % 100 mL IVPB        2 g 200 mL/hr over 30 Minutes Intravenous  Once 03/27/21 1720 03/27/21 2132       I have personally reviewed the following labs and images: CBC: Recent Labs  Lab 03/27/21 1805 03/29/21 0210 03/29/21 1310 03/30/21 0341  WBC 7.6 7.1 9.0 9.2  NEUTROABS 6.2  --   --   --   HGB 12.4* 9.6* 9.7* 8.5*  HCT 38.1* 28.3* 28.5* 26.1*  MCV 88.6 86.3 86.6 89.1  PLT 218 232 254 292   BMP &GFR Recent Labs  Lab 03/27/21 1805 03/28/21 0655 03/29/21 0210 03/29/21 1310 03/30/21 0341  NA 131*  --  134* 134* 134*  K 3.6  --  2.8* 3.1* 3.5  CL 96*  --  104 107 108  CO2 24  --  21* 22 19*  GLUCOSE 176*  --  129* 145* 131*  BUN 17  --  13 11 9   CREATININE 1.08  --  0.85 0.75 0.81  CALCIUM 8.7*  --  8.1* 8.3* 8.1*  MG  --  1.8  --  1.9 1.8  PHOS  --   --   --  2.9 3.0   Estimated Creatinine Clearance: 146.8 mL/min (by C-G formula based on SCr of 0.81 mg/dL). Liver & Pancreas: Recent Labs  Lab 03/27/21 1805 03/29/21 0210 03/29/21 1310 03/30/21 0341  AST 67* 49*  --   --   ALT 29 30  --   --   ALKPHOS 93 71  --   --   BILITOT 0.7 0.3  --   --   PROT 8.6* 6.1*  --   --   ALBUMIN 3.1* 2.1* 2.2* 2.0*   No results for input(s): LIPASE, AMYLASE in the last 168 hours. No results for input(s): AMMONIA in the last 168 hours. Diabetic: Recent Labs    03/28/21 0655 03/29/21 1310  HGBA1C 6.2* 6.3*   Recent Labs  Lab 03/29/21 1631 03/29/21 1806 03/29/21 2054 03/30/21 0743 03/30/21 1119  GLUCAP 126* 128* 186* 126* 249*   Cardiac Enzymes: No results for input(s): CKTOTAL, CKMB, CKMBINDEX, TROPONINI in the  last 168 hours. No results for input(s): PROBNP in the last 8760 hours. Coagulation Profile: Recent Labs  Lab 03/27/21 1805  INR 1.1   Thyroid Function Tests: No results for input(s): TSH, T4TOTAL, FREET4, T3FREE, THYROIDAB in the last 72 hours. Lipid Profile: Recent Labs     03/30/21 0341  CHOL 84  HDL <10*  LDLCALC NOT CALCULATED  TRIG 182*  CHOLHDL UNABLE TO CALCULATE.   Anemia Panel: No results for input(s): VITAMINB12, FOLATE, FERRITIN, TIBC, IRON, RETICCTPCT in the last 72 hours. Urine analysis:    Component Value Date/Time   COLORURINE STRAW (A) 03/29/2021 [place] 03/29/2021 1705   LABSPEC 1.010 03/29/2021 1705   PHURINE 6.0 03/29/2021 1705   GLUCOSEU NEGATIVE 03/29/2021 1705   HGBUR MODERATE (A) 03/29/2021 1705   BILIRUBINUR NEGATIVE 03/29/2021 [place] 03/29/2021 1705   PROTEINUR NEGATIVE 03/29/2021 1705   UROBILINOGEN 4.0 (H) 10/09/2008 1114   NITRITE NEGATIVE 03/29/2021 1705   LEUKOCYTESUR NEGATIVE 03/29/2021 1705   Sepsis Labs: Invalid input(s): PROCALCITONIN, [place]  Microbiology: Recent Results (from the past 240 hour(s))  Blood Culture (routine x 2)     Status: Abnormal   Collection Time: 03/27/21  6:05 PM   Specimen: Right Antecubital; Blood  Result Value Ref Range Status   Specimen Description   Final    RIGHT ANTECUBITAL Performed at [facility], [address], [place], [place] [address]    Special Requests   Final    Blood Culture adequate volume BOTTLES DRAWN AEROBIC AND ANAEROBIC Performed at [facility], [address] [place], [place] [address]    Culture  Setup Time   Final    GRAM POSITIVE COCCI Gram Stain Report Called to,Read Back By and Verified With: RACHEAL TATE,RN @1154  03/28/2021 KAY AEROBIC BOTTLE ONLY CRITICAL RESULT CALLED TO, READ BACK BY AND VERIFIED WITH: VICTORIA BASS RN @1627  03/28/21 EB ANAEROBIC BOTTLE ONLY GRAM POSITIVE COCCI Gram Stain Report Called to,Read Back By and Verified With: PREVIOUSLY CALLED @1154  03/28/2021 TO RACHEAL TATE,RN    Culture (A)  Final    STAPHYLOCOCCUS HOMINIS THE SIGNIFICANCE OF ISOLATING THIS ORGANISM FROM A SINGLE SET OF BLOOD CULTURES WHEN MULTIPLE SETS ARE DRAWN IS UNCERTAIN. PLEASE NOTIFY THE MICROBIOLOGY DEPARTMENT  WITHIN ONE WEEK IF SPECIATION AND SENSITIVITIES ARE REQUIRED. Performed at [facility], [place] [address], [place], [place] [address]    Report Status 03/30/2021 FINAL  Final  Blood Culture ID Panel (Reflexed)     Status: Abnormal   Collection Time: 03/27/21  6:05 PM  Result Value Ref Range Status   Enterococcus faecalis NOT DETECTED NOT DETECTED Final   Enterococcus Faecium NOT DETECTED NOT DETECTED Final   Listeria monocytogenes NOT DETECTED NOT DETECTED Final   Staphylococcus species DETECTED (A) NOT DETECTED Final    Comment: CRITICAL RESULT CALLED TO, READ BACK BY AND VERIFIED WITH: VICTORIA BASS RN @1627  03/28/21 EB    Staphylococcus aureus (BCID) NOT DETECTED NOT DETECTED Final   Staphylococcus epidermidis NOT DETECTED NOT DETECTED Final   Staphylococcus lugdunensis NOT DETECTED NOT DETECTED Final   Streptococcus species NOT DETECTED NOT DETECTED Final   Streptococcus agalactiae NOT DETECTED NOT DETECTED Final   Streptococcus pneumoniae NOT DETECTED NOT DETECTED Final   Streptococcus pyogenes NOT DETECTED NOT DETECTED Final   A.calcoaceticus-baumannii NOT DETECTED NOT DETECTED Final   Bacteroides fragilis NOT DETECTED NOT DETECTED Final   Enterobacterales NOT DETECTED NOT DETECTED Final   Enterobacter cloacae complex NOT DETECTED NOT DETECTED Final  Escherichia coli NOT DETECTED NOT DETECTED Final   Klebsiella aerogenes NOT DETECTED NOT DETECTED Final   Klebsiella oxytoca NOT DETECTED NOT DETECTED Final   Klebsiella pneumoniae NOT DETECTED NOT DETECTED Final   Proteus species NOT DETECTED NOT DETECTED Final   Salmonella species NOT DETECTED NOT DETECTED Final   Serratia marcescens NOT DETECTED NOT DETECTED Final   Haemophilus influenzae NOT DETECTED NOT DETECTED Final   Neisseria meningitidis NOT DETECTED NOT DETECTED Final   Pseudomonas aeruginosa NOT DETECTED NOT DETECTED Final   Stenotrophomonas maltophilia NOT DETECTED NOT DETECTED Final   Candida albicans NOT  DETECTED NOT DETECTED Final   Candida auris NOT DETECTED NOT DETECTED Final   Candida glabrata NOT DETECTED NOT DETECTED Final   Candida krusei NOT DETECTED NOT DETECTED Final   Candida parapsilosis NOT DETECTED NOT DETECTED Final   Candida tropicalis NOT DETECTED NOT DETECTED Final   Cryptococcus neoformans/gattii NOT DETECTED NOT DETECTED Final    Comment: Performed at [facility], [place] [address] [place],  [address]  Resp Panel by RT-PCR (Flu A&B, Covid) Nasopharyngeal Swab     Status: None   Collection Time: 03/27/21  6:07 PM   Specimen: Nasopharyngeal Swab; Nasopharyngeal(NP) swabs in vial transport medium  Result Value Ref Range Status   SARS Coronavirus 2 by RT PCR NEGATIVE NEGATIVE Final    Comment: (NOTE) SARS-CoV-2 target nucleic acids are NOT DETECTED.  The SARS-CoV-2 RNA is generally detectable in upper respiratory specimens during the acute phase of infection. The lowest concentration of SARS-CoV-2 viral copies this assay can detect is 138 copies/mL. A negative result does not preclude SARS-Cov-2 infection and should not be used as the sole basis for treatment or other patient management decisions. A negative result may occur with  improper specimen collection/handling, submission of specimen other than nasopharyngeal swab, presence of viral mutation(s) within the areas targeted by this assay, and inadequate number of viral copies(<138 copies/mL). A negative result must be combined with clinical observations, patient history, and epidemiological information. The expected result is Negative.  Fact Sheet for Patients:  EntrepreneurPulse.com.au  Fact Sheet for Healthcare Providers:  IncredibleEmployment.be  This test is no t yet approved or cleared by the [place] FDA and  has been authorized for detection and/or diagnosis of SARS-CoV-2 by FDA under an Emergency Use Authorization (EUA). This EUA will remain  in  effect (meaning this test can be used) for the duration of the COVID-19 declaration under Section 564(b)(1) of the Act, 21 U.S.C.section 360bbb-3(b)(1), unless the authorization is terminated  or revoked sooner.       Influenza A by PCR NEGATIVE NEGATIVE Final   Influenza B by PCR NEGATIVE NEGATIVE Final    Comment: (NOTE) The Xpert Xpress SARS-CoV-2/FLU/RSV plus assay is intended as an aid in the diagnosis of influenza from Nasopharyngeal swab specimens and should not be used as a sole basis for treatment. Nasal washings and aspirates are unacceptable for Xpert Xpress SARS-CoV-2/FLU/RSV testing.  Fact Sheet for Patients: EntrepreneurPulse.com.au  Fact Sheet for Healthcare Providers: IncredibleEmployment.be  This test is not yet approved or cleared by the [place] FDA and has been authorized for detection and/or diagnosis of SARS-CoV-2 by FDA under an Emergency Use Authorization (EUA). This EUA will remain in effect (meaning this test can be used) for the duration of the COVID-19 declaration under Section 564(b)(1) of the Act, 21 U.S.C. section 360bbb-3(b)(1), unless the authorization is terminated or revoked.  Performed at [facility], [address], [place], [place]  [address]   Blood Culture (routine x 2)     Status: None (Preliminary result)   Collection Time: 03/27/21  6:20 PM   Specimen: BLOOD LEFT HAND  Result Value Ref Range Status   Specimen Description BLOOD LEFT HAND  Final   Special Requests   Final    Blood Culture adequate volume BOTTLES DRAWN AEROBIC AND ANAEROBIC   Culture   Final    NO GROWTH 3 DAYS Performed at [facility], [address] [place], [place] [address]    Report Status PENDING  Incomplete  Culture, blood (Routine X 2) w Reflex to ID Panel     Status: None (Preliminary result)   Collection Time: 03/29/21  2:00 AM   Specimen: BLOOD RIGHT HAND  Result Value Ref Range Status   Specimen Description  BLOOD RIGHT HAND  Final   Special Requests   Final    BOTTLES DRAWN AEROBIC AND ANAEROBIC Blood Culture adequate volume   Culture   Final    NO GROWTH 1 DAY Performed at [facility], [place] [address], [place], [place] [address]    Report Status PENDING  Incomplete  Culture, blood (Routine X 2) w Reflex to ID Panel     Status: None (Preliminary result)   Collection Time: 03/29/21  2:08 AM   Specimen: BLOOD LEFT HAND  Result Value Ref Range Status   Specimen Description BLOOD LEFT HAND  Final   Special Requests   Final    BOTTLES DRAWN AEROBIC AND ANAEROBIC Blood Culture adequate volume   Culture   Final    NO GROWTH 1 DAY Performed at [facility], [place] [address] [place], [place] [address]    Report Status PENDING  Incomplete  Aerobic/Anaerobic Culture w Gram Stain (surgical/deep wound)     Status: None (Preliminary result)   Collection Time: 03/29/21  3:50 PM   Specimen: PATH Soft tissue  Result Value Ref Range Status   Specimen Description TISSUE RIGHT TOE  Final   Special Requests RT GREAT TOE SWAB  Final   Gram Stain   Final    NO WBC SEEN MODERATE GRAM POSITIVE COCCI RARE GRAM NEGATIVE RODS    Culture   Final    ABUNDANT GRAM NEGATIVE RODS IDENTIFICATION AND SUSCEPTIBILITIES TO FOLLOW MODERATE STREPTOCOCCUS AGALACTIAE TESTING AGAINST S. AGALACTIAE NOT ROUTINELY PERFORMED DUE TO PREDICTABILITY OF AMP/PEN/VAN SUSCEPTIBILITY. Performed at [facility], [place] [address], [place], [place] [address]    Report Status PENDING  Incomplete  Aerobic/Anaerobic Culture w Gram Stain (surgical/deep wound)     Status: None (Preliminary result)   Collection Time: 03/29/21  4:04 PM   Specimen: Soft Tissue, Other  Result Value Ref Range Status   Specimen Description TISSUE  Final   Special Requests RT 1ST METATARSAL  Final   Gram Stain NO WBC SEEN NO ORGANISMS SEEN   Final   Culture   Final    NO GROWTH < 24 HOURS Performed at [facility], [place] [address], [place], [place] [address]    Report Status PENDING  Incomplete    Radiology Studies: DG Foot 2 Views Right  Result Date: 03/29/2021 CLINICAL DATA:  Status post amputation EXAM: RIGHT FOOT - 2 VIEW COMPARISON:  03/27/2021 FINDINGS: There has been interval transmetatarsal amputation of the first toe. No other amputation is seen. Irregularity in the soft tissues is noted related to the recent surgery. No other focal abnormality is noted. IMPRESSION: Interval first transmetatarsal amputation.  Electronically Signed   By: Inez Catalina M.D.   On: 03/29/2021 17:46      Marisela Line T. [place]  If 7PM-7AM, please contact night-coverage [email or web address] 03/30/2021, 4:09 PM

## 2021-03-30 NOTE — H&P (View-Only) (Signed)
Subjective:  Patient ID: Eugene Diaz, male    DOB: [birth year],  MRN: [id number]  Patient seen bedside. Denies pain in his foot. Does report pain in his tooth. Objective:   Vitals:   03/30/21 0540 03/30/21 0928  BP: (!) 102/54 (!) 164/73  Pulse: 88 80  Resp: 16 16  Temp: 98.8 F (37.1 C) 98.3 F (36.8 C)  SpO2: 100% 99%   Dressing c/d/i right foot. Remaining toes good color and warmth. No ascending cellulitis of the leg.  Recent Results (from the past 240 hour(s))  Blood Culture (routine x 2)     Status: Abnormal   Collection Time: 03/27/21  6:05 PM   Specimen: Right Antecubital; Blood  Result Value Ref Range Status   Specimen Description   Final    RIGHT ANTECUBITAL Performed at [facility], [address] [place], [place] [address]    Special Requests   Final    Blood Culture adequate volume BOTTLES DRAWN AEROBIC AND ANAEROBIC Performed at [facility], [address], [place], [place] [address]    Culture  Setup Time   Final    GRAM POSITIVE COCCI Gram Stain Report Called to,Read Back By and Verified With: RACHEAL TATE,RN @1154  03/28/2021 KAY AEROBIC BOTTLE ONLY CRITICAL RESULT CALLED TO, READ BACK BY AND VERIFIED WITH: VICTORIA BASS RN @1627  03/28/21 EB ANAEROBIC BOTTLE ONLY GRAM POSITIVE COCCI Gram Stain Report Called to,Read Back By and Verified With: PREVIOUSLY CALLED @1154  03/28/2021 TO RACHEAL TATE,RN    Culture (A)  Final    STAPHYLOCOCCUS HOMINIS THE SIGNIFICANCE OF ISOLATING THIS ORGANISM FROM A SINGLE SET OF BLOOD CULTURES WHEN MULTIPLE SETS ARE DRAWN IS UNCERTAIN. PLEASE NOTIFY THE MICROBIOLOGY DEPARTMENT WITHIN ONE WEEK IF SPECIATION AND SENSITIVITIES ARE REQUIRED. Performed at [facility], [place] [address], [place], [place] [address]    Report Status 03/30/2021 FINAL  Final  Blood Culture ID Panel (Reflexed)     Status: Abnormal   Collection Time: 03/27/21  6:05 PM  Result Value Ref Range Status   Enterococcus faecalis NOT DETECTED NOT DETECTED  Final   Enterococcus Faecium NOT DETECTED NOT DETECTED Final   Listeria monocytogenes NOT DETECTED NOT DETECTED Final   Staphylococcus species DETECTED (A) NOT DETECTED Final    Comment: CRITICAL RESULT CALLED TO, READ BACK BY AND VERIFIED WITH: VICTORIA BASS RN @1627  03/28/21 EB    Staphylococcus aureus (BCID) NOT DETECTED NOT DETECTED Final   Staphylococcus epidermidis NOT DETECTED NOT DETECTED Final   Staphylococcus lugdunensis NOT DETECTED NOT DETECTED Final   Streptococcus species NOT DETECTED NOT DETECTED Final   Streptococcus agalactiae NOT DETECTED NOT DETECTED Final   Streptococcus pneumoniae NOT DETECTED NOT DETECTED Final   Streptococcus pyogenes NOT DETECTED NOT DETECTED Final   A.calcoaceticus-baumannii NOT DETECTED NOT DETECTED Final   Bacteroides fragilis NOT DETECTED NOT DETECTED Final   Enterobacterales NOT DETECTED NOT DETECTED Final   Enterobacter cloacae complex NOT DETECTED NOT DETECTED Final   Escherichia coli NOT DETECTED NOT DETECTED Final   Klebsiella aerogenes NOT DETECTED NOT DETECTED Final   Klebsiella oxytoca NOT DETECTED NOT DETECTED Final   Klebsiella pneumoniae NOT DETECTED NOT DETECTED Final   Proteus species NOT DETECTED NOT DETECTED Final   Salmonella species NOT DETECTED NOT DETECTED Final   Serratia marcescens NOT DETECTED NOT DETECTED Final   Haemophilus influenzae NOT DETECTED NOT DETECTED Final   Neisseria meningitidis NOT DETECTED NOT DETECTED Final   Pseudomonas aeruginosa NOT DETECTED NOT DETECTED Final   Stenotrophomonas maltophilia NOT  DETECTED NOT DETECTED Final   Candida albicans NOT DETECTED NOT DETECTED Final   Candida auris NOT DETECTED NOT DETECTED Final   Candida glabrata NOT DETECTED NOT DETECTED Final   Candida krusei NOT DETECTED NOT DETECTED Final   Candida parapsilosis NOT DETECTED NOT DETECTED Final   Candida tropicalis NOT DETECTED NOT DETECTED Final   Cryptococcus neoformans/gattii NOT DETECTED NOT DETECTED Final     Comment: Performed at [facility], [place] [address], [place], [place] [address]  Resp Panel by RT-PCR (Flu A&B, Covid) Nasopharyngeal Swab     Status: None   Collection Time: 03/27/21  6:07 PM   Specimen: Nasopharyngeal Swab; Nasopharyngeal(NP) swabs in vial transport medium  Result Value Ref Range Status   SARS Coronavirus 2 by RT PCR NEGATIVE NEGATIVE Final    Comment: (NOTE) SARS-CoV-2 target nucleic acids are NOT DETECTED.  The SARS-CoV-2 RNA is generally detectable in upper respiratory specimens during the acute phase of infection. The lowest concentration of SARS-CoV-2 viral copies this assay can detect is 138 copies/mL. A negative result does not preclude SARS-Cov-2 infection and should not be used as the sole basis for treatment or other patient management decisions. A negative result may occur with  improper specimen collection/handling, submission of specimen other than nasopharyngeal swab, presence of viral mutation(s) within the areas targeted by this assay, and inadequate number of viral copies(<138 copies/mL). A negative result must be combined with clinical observations, patient history, and epidemiological information. The expected result is Negative.  Fact Sheet for Patients:  EntrepreneurPulse.com.au  Fact Sheet for Healthcare Providers:  IncredibleEmployment.be  This test is no t yet approved or cleared by the [place] FDA and  has been authorized for detection and/or diagnosis of SARS-CoV-2 by FDA under an Emergency Use Authorization (EUA). This EUA will remain  in effect (meaning this test can be used) for the duration of the COVID-19 declaration under Section 564(b)(1) of the Act, 21 U.S.C.section 360bbb-3(b)(1), unless the authorization is terminated  or revoked sooner.       Influenza A by PCR NEGATIVE NEGATIVE Final   Influenza B by PCR NEGATIVE NEGATIVE Final    Comment: (NOTE) The Xpert Xpress  SARS-CoV-2/FLU/RSV plus assay is intended as an aid in the diagnosis of influenza from Nasopharyngeal swab specimens and should not be used as a sole basis for treatment. Nasal washings and aspirates are unacceptable for Xpert Xpress SARS-CoV-2/FLU/RSV testing.  Fact Sheet for Patients: EntrepreneurPulse.com.au  Fact Sheet for Healthcare Providers: IncredibleEmployment.be  This test is not yet approved or cleared by the [place] FDA and has been authorized for detection and/or diagnosis of SARS-CoV-2 by FDA under an Emergency Use Authorization (EUA). This EUA will remain in effect (meaning this test can be used) for the duration of the COVID-19 declaration under Section 564(b)(1) of the Act, 21 U.S.C. section 360bbb-3(b)(1), unless the authorization is terminated or revoked.  Performed at [facility], [address] [place], [place] [address]   Blood Culture (routine x 2)     Status: None (Preliminary result)   Collection Time: 03/27/21  6:20 PM   Specimen: BLOOD LEFT HAND  Result Value Ref Range Status   Specimen Description BLOOD LEFT HAND  Final   Special Requests   Final    Blood Culture adequate volume BOTTLES DRAWN AEROBIC AND ANAEROBIC   Culture   Final    NO GROWTH 3 DAYS Performed at [facility], [address] [place], [place] [address]    Report Status  PENDING  Incomplete  Culture, blood (Routine X 2) w Reflex to ID Panel     Status: None (Preliminary result)   Collection Time: 03/29/21  2:00 AM   Specimen: BLOOD RIGHT HAND  Result Value Ref Range Status   Specimen Description BLOOD RIGHT HAND  Final   Special Requests   Final    BOTTLES DRAWN AEROBIC AND ANAEROBIC Blood Culture adequate volume   Culture   Final    NO GROWTH 1 DAY Performed at [facility], [place] [address] [place], [place] [address]    Report Status PENDING  Incomplete  Culture, blood (Routine X 2) w Reflex to ID Panel     Status: None  (Preliminary result)   Collection Time: 03/29/21  2:08 AM   Specimen: BLOOD LEFT HAND  Result Value Ref Range Status   Specimen Description BLOOD LEFT HAND  Final   Special Requests   Final    BOTTLES DRAWN AEROBIC AND ANAEROBIC Blood Culture adequate volume   Culture   Final    NO GROWTH 1 DAY Performed at [facility], [place] [address] [place], [place] [address]    Report Status PENDING  Incomplete  Aerobic/Anaerobic Culture w Gram Stain (surgical/deep wound)     Status: None (Preliminary result)   Collection Time: 03/29/21  3:50 PM   Specimen: PATH Soft tissue  Result Value Ref Range Status   Specimen Description TISSUE RIGHT TOE  Final   Special Requests RT GREAT TOE SWAB  Final   Gram Stain   Final    NO WBC SEEN MODERATE GRAM POSITIVE COCCI RARE GRAM NEGATIVE RODS    Culture   Final    ABUNDANT GRAM NEGATIVE RODS IDENTIFICATION AND SUSCEPTIBILITIES TO FOLLOW MODERATE STREPTOCOCCUS AGALACTIAE TESTING AGAINST S. AGALACTIAE NOT ROUTINELY PERFORMED DUE TO PREDICTABILITY OF AMP/PEN/VAN SUSCEPTIBILITY. Performed at [facility], [place] [address] [place], [place] [address]    Report Status PENDING  Incomplete  Aerobic/Anaerobic Culture w Gram Stain (surgical/deep wound)     Status: None (Preliminary result)   Collection Time: 03/29/21  4:04 PM   Specimen: Soft Tissue, Other  Result Value Ref Range Status   Specimen Description TISSUE  Final   Special Requests RT 1ST METATARSAL  Final   Gram Stain NO WBC SEEN NO ORGANISMS SEEN   Final   Culture   Final    NO GROWTH < 24 HOURS Performed at [facility], [place] [address] [place], [place] [address]    Report Status PENDING  Incomplete    Assessment & Plan:  Patient was evaluated and treated and all questions answered.  Right foot Osteomyelitis -Dressing left intact today -Planned RTOR tomorrow for wound debridement and closure, possible further 1st metatarsal resection. -Abx per ID. -NPO after midnight  tonight -Will continue to follow.  Evelina Bucy, DPM  Accessible via secure chat for questions or concerns.

## 2021-03-31 DIAGNOSIS — E1169 Type 2 diabetes mellitus with other specified complication: Secondary | ICD-10-CM | POA: Diagnosis not present

## 2021-03-31 DIAGNOSIS — G621 Alcoholic polyneuropathy: Secondary | ICD-10-CM | POA: Diagnosis not present

## 2021-03-31 DIAGNOSIS — E11621 Type 2 diabetes mellitus with foot ulcer: Secondary | ICD-10-CM | POA: Diagnosis not present

## 2021-03-31 DIAGNOSIS — E1141 Type 2 diabetes mellitus with diabetic mononeuropathy: Secondary | ICD-10-CM | POA: Diagnosis not present

## 2021-03-31 LAB — GLUCOSE, CAPILLARY
Glucose-Capillary: 129 mg/dL — ABNORMAL HIGH (ref 70–99)
Glucose-Capillary: 139 mg/dL — ABNORMAL HIGH (ref 70–99)
Glucose-Capillary: 133 mg/dL — ABNORMAL HIGH (ref 70–99)
Glucose-Capillary: 275 mg/dL — ABNORMAL HIGH (ref 70–99)

## 2021-03-31 LAB — COMPREHENSIVE METABOLIC PANEL
ALT: 19 U/L (ref 0–44)
AST: 23 U/L (ref 15–41)
Albumin: 2.2 g/dL — ABNORMAL LOW (ref 3.5–5.0)
Alkaline Phosphatase: 66 U/L (ref 38–126)
Anion gap: 6 (ref 5–15)
BUN: 6 mg/dL (ref 6–20)
CO2: 25 mmol/L (ref 22–32)
Calcium: 8.6 mg/dL — ABNORMAL LOW (ref 8.9–10.3)
Chloride: 106 mmol/L (ref 98–111)
Creatinine, Ser: 0.92 mg/dL (ref 0.61–1.24)
GFR, Estimated: >60 mL/min (ref >60)
Glucose, Bld: 134 mg/dL — ABNORMAL HIGH (ref 70–99)
Potassium: 4.1 mmol/L (ref 3.5–5.1)
Sodium: 137 mmol/L (ref 135–145)
Total Bilirubin: 0.3 mg/dL (ref 0.3–1.2)
Total Protein: 6.5 g/dL (ref 6.5–8.1)

## 2021-03-31 LAB — SURGICAL PCR SCREEN
MRSA, PCR: NEGATIVE
Staphylococcus aureus: POSITIVE — AB

## 2021-03-31 LAB — CBC
HCT: 26.7 % — ABNORMAL LOW (ref 39.0–52.0)
Hemoglobin: 8.9 g/dL — ABNORMAL LOW (ref 13.0–17.0)
MCH: 29.3 pg (ref 26.0–34.0)
MCHC: 33.3 g/dL (ref 30.0–36.0)
MCV: 87.8 fL (ref 80.0–100.0)
Platelets: 314 10*3/uL (ref 150–400)
RBC: 3.04 MIL/uL — ABNORMAL LOW (ref 4.22–5.81)
RDW: 13 % (ref 11.5–15.5)
WBC: 7.8 10*3/uL (ref 4.0–10.5)
nRBC: 0 % (ref 0.0–0.2)

## 2021-03-31 LAB — URINE CULTURE: Culture: NO GROWTH

## 2021-03-31 LAB — MAGNESIUM: Magnesium: 2 mg/dL (ref 1.7–2.4)

## 2021-03-31 LAB — SURGICAL PATHOLOGY

## 2021-03-31 LAB — PHOSPHORUS: Phosphorus: 2.8 mg/dL (ref 2.5–4.6)

## 2021-03-31 MED ORDER — ALPRAZOLAM 0.5 MG PO TABS
0.5000 mg | ORAL_TABLET | Freq: Three times a day (TID) | ORAL | Status: DC | PRN
  Administered 2021-03-31: 0.5 mg via ORAL
  Filled 2021-03-31: qty 1

## 2021-03-31 MED ORDER — TRAZODONE HCL 50 MG PO TABS
25.0000 mg | ORAL_TABLET | Freq: Every evening | ORAL | Status: DC | PRN
  Administered 2021-03-31: 25 mg via ORAL
  Filled 2021-03-31: qty 1

## 2021-03-31 MED ORDER — CHLORHEXIDINE GLUCONATE CLOTH 2 % EX PADS
6.0000 | MEDICATED_PAD | Freq: Every day | CUTANEOUS | Status: DC
  Administered 2021-04-01 – 2021-04-02 (×2): 6 via TOPICAL

## 2021-03-31 MED ORDER — METRONIDAZOLE 500 MG PO TABS
500.0000 mg | ORAL_TABLET | Freq: Two times a day (BID) | ORAL | Status: DC
  Administered 2021-03-31 – 2021-04-02 (×3): 500 mg via ORAL
  Filled 2021-03-31 (×3): qty 1

## 2021-03-31 MED ORDER — HYDROCHLOROTHIAZIDE 12.5 MG PO CAPS
12.5000 mg | ORAL_CAPSULE | Freq: Every day | ORAL | Status: DC
  Administered 2021-03-31: 12.5 mg via ORAL
  Filled 2021-03-31: qty 1

## 2021-03-31 MED ORDER — CEFAZOLIN SODIUM-DEXTROSE 2-4 GM/100ML-% IV SOLN
2.0000 g | Freq: Three times a day (TID) | INTRAVENOUS | Status: DC
  Administered 2021-03-31 – 2021-04-02 (×6): 2 g via INTRAVENOUS
  Filled 2021-03-31 (×9): qty 100

## 2021-03-31 MED ORDER — MUPIROCIN 2 % EX OINT
1.0000 "application " | TOPICAL_OINTMENT | Freq: Two times a day (BID) | CUTANEOUS | Status: DC
  Administered 2021-03-31 – 2021-04-02 (×4): 1 via NASAL
  Filled 2021-03-31: qty 22

## 2021-03-31 NOTE — Progress Notes (Signed)
PHARMACY CONSULT NOTE FOR:  OUTPATIENT  PARENTERAL ANTIBIOTIC THERAPY (OPAT)  Indication: Osteomyelitis  Regimen: Cefazolin 2 gm IV q 8 hours + Metronidazole 500 mg PO Q 12 hours  End date: 05/12/21  IV antibiotic discharge orders are pended. To discharging provider:  please sign these orders via discharge navigator,  Select New Orders & click on the button choice - Manage This Unsigned Work.     Thank you for allowing pharmacy to be a part of this patient's care.  Jimmy Footman, PharmD, BCPS, BCIDP Infectious Diseases Clinical Pharmacist Phone: [phone] 03/31/2021, 1:21 PM

## 2021-03-31 NOTE — Plan of Care (Signed)
Due to OR staffing issues will have to push patient's surgery until tomorrow. Allow him to eat and then resume NPO tomorrow as of 10am.   Please contact with questions or concerns.  Evelina Bucy, DPM

## 2021-03-31 NOTE — Progress Notes (Signed)
Subjective: No new complaints   Antibiotics:  Anti-infectives (From admission, onward)   Start     Dose/Rate Route Frequency Ordered Stop   03/29/21 2200  vancomycin (VANCOREADY) IVPB 1250 mg/250 mL  Status:  Discontinued        1,250 mg 166.7 mL/hr over 90 Minutes Intravenous Every 12 hours 03/29/21 1939 03/30/21 1615   03/29/21 1513  vancomycin (VANCOCIN) powder  Status:  Discontinued          As needed 03/29/21 1514 03/29/21 1624   03/29/21 1145  ceFEPIme (MAXIPIME) 2 g in sodium chloride 0.9 % 100 mL IVPB  Status:  Discontinued        2 g 200 mL/hr over 30 Minutes Intravenous Every 8 hours 03/29/21 1050 03/31/21 1225   03/29/21 1130  metroNIDAZOLE (FLAGYL) IVPB 500 mg        500 mg 100 mL/hr over 60 Minutes Intravenous Every 8 hours 03/29/21 1035     03/29/21 1101  vancomycin variable dose per unstable renal function (pharmacist dosing)  Status:  Discontinued         Does not apply See admin instructions 03/29/21 1105 03/29/21 1941   03/28/21 1900  cefTRIAXone (ROCEPHIN) 2 g in sodium chloride 0.9 % 100 mL IVPB  Status:  Discontinued        2 g 200 mL/hr over 30 Minutes Intravenous Every 24 hours 03/27/21 2129 03/29/21 1035   03/28/21 1800  vancomycin (VANCOREADY) IVPB 2000 mg/400 mL        2,000 mg 200 mL/hr over 120 Minutes Intravenous Every 12 hours 03/28/21 0922 03/29/21 1153   03/28/21 0600  vancomycin (VANCOREADY) IVPB 1250 mg/250 mL  Status:  Discontinued        1,250 mg 166.7 mL/hr over 90 Minutes Intravenous Every 12 hours 03/27/21 2118 03/28/21 0922   03/27/21 1800  vancomycin (VANCOREADY) IVPB 2000 mg/400 mL        2,000 mg 200 mL/hr over 120 Minutes Intravenous  Once 03/27/21 1737 03/27/21 2132   03/27/21 1730  vancomycin (VANCOCIN) IVPB 1000 mg/200 mL premix  Status:  Discontinued        1,000 mg 200 mL/hr over 60 Minutes Intravenous  Once 03/27/21 1720 03/27/21 1737   03/27/21 1730  cefTRIAXone (ROCEPHIN) 2 g in sodium chloride 0.9 % 100 mL IVPB         2 g 200 mL/hr over 30 Minutes Intravenous  Once 03/27/21 1720 03/27/21 2132      Medications: Scheduled Meds: . amLODipine  10 mg Oral Daily  . aspirin EC  81 mg Oral Daily  . atorvastatin  40 mg Oral QPM  . folic acid  1 mg Oral Daily  . heparin  5,000 Units Subcutaneous Q8H  . hydrALAZINE  75 mg Oral Q8H  . hydrochlorothiazide  12.5 mg Oral Daily  . insulin aspart  0-15 Units Subcutaneous TID WC  . insulin aspart  0-5 Units Subcutaneous QHS  . insulin aspart  4 Units Subcutaneous TID WC  . lisinopril  40 mg Oral QHS  . multivitamin with minerals  1 tablet Oral Daily  . thiamine  100 mg Oral Daily   Or  . thiamine  100 mg Intravenous Daily   Continuous Infusions: . metronidazole 500 mg (03/31/21 0341)   PRN Meds:.acetaminophen **OR** acetaminophen, hydrALAZINE, ondansetron **OR** ondansetron (ZOFRAN) IV, oxyCODONE, polyethylene glycol, traZODone    Objective: Weight change:   Intake/Output Summary (Last 24 hours) at 03/31/2021 1225 Last  data filed at 03/31/2021 1026 Gross per 24 hour  Intake 540 ml  Output 4400 ml  Net -3860 ml   Blood pressure (!) 156/80, pulse 67, temperature 98.3 F (36.8 C), resp. rate 17, height 6\' 2"  (1.88 m), weight 131.5 kg, SpO2 98 %. Temp:  [98 F (36.7 C)-98.4 F (36.9 C)] 98.3 F (36.8 C) (06/01 1000) Pulse Rate:  [64-71] 67 (06/01 1000) Resp:  [17-20] 17 (06/01 1000) BP: (151-162)/(72-81) 156/80 (06/01 1000) SpO2:  [96 %-99 %] 98 % (06/01 1000)  Physical Exam: Physical Exam Constitutional:      Appearance: He is well-developed.  HENT:     Head: Normocephalic and atraumatic.  Eyes:     Conjunctiva/sclera: Conjunctivae normal.  Cardiovascular:     Rate and Rhythm: Normal rate and regular rhythm.  Pulmonary:     Effort: Pulmonary effort is normal. No respiratory distress.     Breath sounds: No wheezing.  Abdominal:     General: There is no distension.     Palpations: Abdomen is soft.  Musculoskeletal:     Cervical back:  Normal range of motion and neck supple.  Skin:    General: Skin is warm and dry.     Findings: No erythema or rash.  Neurological:     General: No focal deficit present.     Mental Status: He is alert and oriented to person, place, and time.  Psychiatric:        Mood and Affect: Mood normal.        Behavior: Behavior normal.        Thought Content: Thought content normal.        Judgment: Judgment normal.   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       Sedimentation Rate No results for input(s): ESRSEDRATE in the last 72 hours. C-Reactive Protein No results for input(s): CRP in the last 72 hours.  Micro Results: Recent Results (from the past 720 hour(s))  Blood Culture (routine x 2)     Status: Abnormal   Collection Time: 03/27/21  6:05 PM   Specimen: Right Antecubital; Blood  Result Value Ref Range Status   Specimen Description   Final    RIGHT ANTECUBITAL Performed at [facility], [address], [place], [place] [address]    Special Requests   Final    Blood Culture adequate volume BOTTLES DRAWN AEROBIC AND ANAEROBIC Performed at [facility], [address], [place], [place] [address]    Culture  Setup Time   Final    GRAM POSITIVE COCCI Gram Stain Report Called to,Read Back By and Verified With: RACHEAL TATE,RN @1154  03/28/2021 KAY AEROBIC BOTTLE ONLY  CRITICAL RESULT CALLED TO, READ BACK BY AND VERIFIED WITH: VICTORIA BASS RN @1627  03/28/21 EB ANAEROBIC  BOTTLE ONLY GRAM POSITIVE COCCI Gram Stain Report Called to,Read Back By and Verified With: PREVIOUSLY CALLED @1154  03/28/2021 TO RACHEAL TATE,RN    Culture (A)  Final    STAPHYLOCOCCUS HOMINIS THE SIGNIFICANCE OF ISOLATING THIS ORGANISM FROM A SINGLE SET OF BLOOD CULTURES WHEN MULTIPLE SETS ARE DRAWN IS UNCERTAIN. PLEASE NOTIFY THE MICROBIOLOGY DEPARTMENT WITHIN ONE WEEK IF SPECIATION AND SENSITIVITIES ARE REQUIRED. Performed at [facility], [place] [address] [place], [place] [address]    Report Status 03/30/2021 FINAL  Final  Blood Culture ID Panel (Reflexed)     Status: Abnormal   Collection Time: 03/27/21  6:05 PM  Result Value Ref Range Status   Enterococcus faecalis NOT DETECTED NOT DETECTED Final   Enterococcus Faecium NOT DETECTED NOT DETECTED Final   Listeria monocytogenes NOT DETECTED NOT DETECTED Final   Staphylococcus species DETECTED (A) NOT DETECTED Final    Comment: CRITICAL RESULT CALLED TO, READ BACK BY AND VERIFIED WITH: VICTORIA BASS RN @1627  03/28/21 EB    Staphylococcus aureus (BCID) NOT DETECTED NOT DETECTED Final   Staphylococcus epidermidis NOT DETECTED NOT DETECTED Final   Staphylococcus lugdunensis NOT DETECTED NOT DETECTED Final   Streptococcus species NOT DETECTED NOT DETECTED Final   Streptococcus agalactiae NOT DETECTED NOT DETECTED Final   Streptococcus pneumoniae NOT DETECTED NOT DETECTED Final   Streptococcus pyogenes NOT DETECTED NOT DETECTED Final   A.calcoaceticus-baumannii NOT DETECTED NOT DETECTED Final   Bacteroides fragilis NOT DETECTED NOT DETECTED Final   Enterobacterales NOT DETECTED NOT DETECTED Final   Enterobacter cloacae complex NOT DETECTED NOT DETECTED Final   Escherichia coli NOT DETECTED NOT DETECTED Final   Klebsiella aerogenes NOT DETECTED NOT DETECTED Final   Klebsiella oxytoca NOT DETECTED NOT DETECTED Final   Klebsiella pneumoniae NOT DETECTED NOT DETECTED Final   Proteus species NOT DETECTED NOT DETECTED Final    Salmonella species NOT DETECTED NOT DETECTED Final   Serratia marcescens NOT DETECTED NOT DETECTED Final   Haemophilus influenzae NOT DETECTED NOT DETECTED Final   Neisseria meningitidis NOT DETECTED NOT DETECTED Final   Pseudomonas aeruginosa NOT DETECTED NOT DETECTED Final   Stenotrophomonas maltophilia NOT DETECTED NOT DETECTED Final   Candida albicans NOT DETECTED NOT DETECTED Final   Candida auris NOT DETECTED NOT DETECTED Final   Candida glabrata NOT DETECTED NOT DETECTED Final   Candida krusei NOT DETECTED NOT DETECTED Final   Candida parapsilosis NOT DETECTED NOT DETECTED Final   Candida tropicalis NOT DETECTED NOT DETECTED Final   Cryptococcus neoformans/gattii NOT DETECTED NOT DETECTED Final    Comment: Performed at [facility], [address], [place], [place] [address]  Resp Panel by RT-PCR (Flu A&B, Covid) Nasopharyngeal Swab     Status: None   Collection Time: 03/27/21  6:07 PM   Specimen: Nasopharyngeal Swab; Nasopharyngeal(NP) swabs in vial transport medium  Result Value Ref Range Status   SARS Coronavirus 2 by RT PCR NEGATIVE NEGATIVE Final    Comment: (NOTE) SARS-CoV-2 target nucleic acids are NOT DETECTED.  The SARS-CoV-2 RNA is generally detectable in upper respiratory specimens during the acute phase of infection. The lowest concentration of SARS-CoV-2 viral copies this assay can detect is 138 copies/mL. A negative result does not preclude SARS-Cov-2 infection and should not be used as the sole basis for treatment or other patient management decisions. A negative result may occur with  improper specimen collection/handling, submission  of specimen other than nasopharyngeal swab, presence of viral mutation(s) within the areas targeted by this assay, and inadequate number of viral copies(<138 copies/mL). A negative result must be combined with clinical observations, patient history, and epidemiological information. The expected result is Negative.  Fact  Sheet for Patients:  EntrepreneurPulse.com.au  Fact Sheet for Healthcare Providers:  IncredibleEmployment.be  This test is no t yet approved or cleared by the [place] FDA and  has been authorized for detection and/or diagnosis of SARS-CoV-2 by FDA under an Emergency Use Authorization (EUA). This EUA will remain  in effect (meaning this test can be used) for the duration of the COVID-19 declaration under Section 564(b)(1) of the Act, 21 U.S.C.section 360bbb-3(b)(1), unless the authorization is terminated  or revoked sooner.       Influenza A by PCR NEGATIVE NEGATIVE Final   Influenza B by PCR NEGATIVE NEGATIVE Final    Comment: (NOTE) The Xpert Xpress SARS-CoV-2/FLU/RSV plus assay is intended as an aid in the diagnosis of influenza from Nasopharyngeal swab specimens and should not be used as a sole basis for treatment. Nasal washings and aspirates are unacceptable for Xpert Xpress SARS-CoV-2/FLU/RSV testing.  Fact Sheet for Patients: EntrepreneurPulse.com.au  Fact Sheet for Healthcare Providers: IncredibleEmployment.be  This test is not yet approved or cleared by the [place] FDA and has been authorized for detection and/or diagnosis of SARS-CoV-2 by FDA under an Emergency Use Authorization (EUA). This EUA will remain in effect (meaning this test can be used) for the duration of the COVID-19 declaration under Section 564(b)(1) of the Act, 21 U.S.C. section 360bbb-3(b)(1), unless the authorization is terminated or revoked.  Performed at [facility], [address], [place], [place] [address]   Blood Culture (routine x 2)     Status: None (Preliminary result)   Collection Time: 03/27/21  6:20 PM   Specimen: BLOOD LEFT HAND  Result Value Ref Range Status   Specimen Description BLOOD LEFT HAND  Final   Special Requests   Final    Blood Culture adequate volume BOTTLES DRAWN AEROBIC AND ANAEROBIC    Culture   Final    NO GROWTH 4 DAYS Performed at [facility], [address] [place], [place] [address]    Report Status PENDING  Incomplete  Culture, blood (Routine X 2) w Reflex to ID Panel     Status: None (Preliminary result)   Collection Time: 03/29/21  2:00 AM   Specimen: BLOOD RIGHT HAND  Result Value Ref Range Status   Specimen Description BLOOD RIGHT HAND  Final   Special Requests   Final    BOTTLES DRAWN AEROBIC AND ANAEROBIC Blood Culture adequate volume   Culture   Final    NO GROWTH 2 DAYS Performed at [facility], [place] [address] [place], [place] [address]    Report Status PENDING  Incomplete  Culture, blood (Routine X 2) w Reflex to ID Panel     Status: None (Preliminary result)   Collection Time: 03/29/21  2:08 AM   Specimen: BLOOD LEFT HAND  Result Value Ref Range Status   Specimen Description BLOOD LEFT HAND  Final   Special Requests   Final    BOTTLES DRAWN AEROBIC AND ANAEROBIC Blood Culture adequate volume   Culture   Final    NO GROWTH 2 DAYS Performed at [facility], [place] [address] [place], [place] [address]    Report Status PENDING  Incomplete  Aerobic/Anaerobic Culture w Gram Stain (surgical/deep wound)  Status: None (Preliminary result)   Collection Time: 03/29/21  3:50 PM   Specimen: PATH Soft tissue  Result Value Ref Range Status   Specimen Description TISSUE RIGHT TOE  Final   Special Requests RT GREAT TOE SWAB  Final   Gram Stain   Final    NO WBC SEEN MODERATE GRAM POSITIVE COCCI RARE GRAM NEGATIVE RODS    Culture   Final    ABUNDANT ESCHERICHIA COLI MODERATE STREPTOCOCCUS AGALACTIAE TESTING AGAINST S. AGALACTIAE NOT ROUTINELY PERFORMED DUE TO PREDICTABILITY OF AMP/PEN/VAN SUSCEPTIBILITY. Performed at [facility], [place] [address] [place], [place] [address]    Report Status PENDING  Incomplete   Organism ID, Bacteria ESCHERICHIA COLI  Final      Susceptibility   Escherichia coli - MIC*    AMPICILLIN <=2  SENSITIVE Sensitive     CEFAZOLIN <=4 SENSITIVE Sensitive     CEFEPIME <=0.12 SENSITIVE Sensitive     CEFTAZIDIME <=1 SENSITIVE Sensitive     CEFTRIAXONE <=0.25 SENSITIVE Sensitive     CIPROFLOXACIN <=0.25 SENSITIVE Sensitive     GENTAMICIN <=1 SENSITIVE Sensitive     IMIPENEM <=0.25 SENSITIVE Sensitive     TRIMETH/SULFA <=20 SENSITIVE Sensitive     AMPICILLIN/SULBACTAM <=2 SENSITIVE Sensitive     PIP/TAZO <=4 SENSITIVE Sensitive     * ABUNDANT ESCHERICHIA COLI  Aerobic/Anaerobic Culture w Gram Stain (surgical/deep wound)     Status: None (Preliminary result)   Collection Time: 03/29/21  4:04 PM   Specimen: Soft Tissue, Other  Result Value Ref Range Status   Specimen Description TISSUE  Final   Special Requests RT 1ST METATARSAL  Final   Gram Stain NO WBC SEEN NO ORGANISMS SEEN   Final   Culture   Final    NO GROWTH < 24 HOURS Performed at [facility], [address] [place], [place] [address]    Report Status PENDING  Incomplete  Urine culture     Status: None   Collection Time: 03/29/21  6:09 PM   Specimen: Urine, Clean Catch  Result Value Ref Range Status   Specimen Description URINE, CLEAN CATCH  Final   Special Requests NONE  Final   Culture   Final    NO GROWTH Performed at [facility], [place] [address] [place], [place] [address]    Report Status 03/31/2021 FINAL  Final    Studies/Results: DG Foot 2 Views Right  Result Date: 03/29/2021 CLINICAL DATA:  Status post amputation EXAM: RIGHT FOOT - 2 VIEW COMPARISON:  03/27/2021 FINDINGS: There has been interval transmetatarsal amputation of the first toe. No other amputation is seen. Irregularity in the soft tissues is noted related to the recent surgery. No other focal abnormality is noted. IMPRESSION: Interval first transmetatarsal amputation. Electronically Signed   By: Inez Catalina M.D.   On: 03/29/2021 17:46      Assessment/Plan:  INTERVAL HISTORY: Cultures have yielded fairly sensitive E. 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 male with extensive smoking history, alcohol abuse peripheral neuropathy, diabetes who is undergone left above-the-knee amputation, now with osteomyelitis involving his first  metatarsal phalangeal tarsal head with an abscess along the flexor houses brevis muscle with gangrenous changes, status post surgery yesterday with I&D of foot abscess and partial first ray resection.  Polymicrobial osteomyelitis:  We will narrow to cefazolin and metronidazole  If he can be responsible with IV antibiotics at home we can discharge him on cefazolin plus or minus the metronidazole (concern about if he starts drinking again and gets very sick from this  Going back to the operating room today or tomorrow.  Alcoholism with alcohol induced neuropathy: He was drinking nearly a gallon of hard liquor per day.  He says he now though is quit and is confident that he can stay sober after being discharged in the hospital.  I spent greater than 35 minutes with the patient including greater than 50% of time in face to face counsel of the patient and reviewing his radiographs personally his laboratory microbiological data and in coordination of his care..    LOS: 4 days   Alcide Evener 03/31/2021, 12:25 PM

## 2021-03-31 NOTE — Progress Notes (Signed)
PROGRESS NOTE  Eugene Diaz IRS:[id number] DOB: 28-Jul-1966   PCP: [name], PA-C  Patient is from: Home  DOA: 03/27/2021 LOS: 4  Chief complaints: Left foot/wound infection  Brief Narrative / Interim history: 55 year old M with PMH of NIDDM-2, PVD, left BKA, neuropathy, chronic right foot ulcer, alcohol abuse, anxiety and depression [facility] with right great toe swelling, discoloration, malodorous discharge and worsening right foot ulcer over the ball of his right foot, and admitted for right foot osteomyelitis with right foot diabetic wound infection.  Cultures obtained.  He was a started on ceftriaxone and vancomycin.  X-ray concerning for osteomyelitis in right great toe.  He was evaluated by general surgery, and transferred to [facility] for evaluation and management by podiatry.  MRI of right foot consistent with right foot cellulitis, abscess and osteomyelitis of the right great toe.  Antibiotics broadened to cefepime, Flagyl and vancomycin.   Patient underwent I&D of right foot complex abscess and partial right first ray resection by [name] on 5/30.  Surgical culture with E. coli and moderate Streptococcus agalactiae.  Antibiotics de-escalated to Ancef and Flagyl.  Patient to return to the OR for further I&D on 6/1.  Subjective: Seen and examined earlier this morning.  No major events overnight of this morning.  No complaints.  Surgical site pain fairly controlled.  He is a little bit anxious about everything.  He says he takes Xanax at home for anxiety which I confirmed through narcotic database  Objective: Vitals:   03/30/21 1823 03/30/21 2115 03/31/21 0349 03/31/21 1000  BP: (!) 151/72 (!) 162/76 (!) 160/81 (!) 156/80  Pulse: 71 64 71 67  Resp: 17 20 18 17   Temp: 98.4 F (36.9 C) 98.1 F (36.7 C) 98 F (36.7 C) 98.3 F (36.8 C)  TempSrc: Oral Oral Oral   SpO2: 99% 98% 96% 98%  Weight:      Height:        Intake/Output Summary (Last 24 hours) at  03/31/2021 1452 Last data filed at 03/31/2021 1300 Gross per 24 hour  Intake 240 ml  Output 4750 ml  Net -4510 ml   Filed Weights   03/27/21 1656  Weight: 131.5 kg    Examination:  GENERAL: No apparent distress.  Nontoxic. HEENT: MMM.  Vision and hearing grossly intact.  NECK: Supple.  No apparent JVD.  RESP: On RA.  No IWOB.  Fair aeration bilaterally. CVS:  RRR. Heart sounds normal.  ABD/GI/GU: BS+. Abd soft, NTND.  MSK/EXT: Left BKA.  Ace wrap and dressing over right foot DCI. SKIN: Ace wrap and dressing over right foot. NEURO: Sleepy but wakes to voice easily.  Oriented appropriately.  No apparent focal neuro deficit. PSYCH: Calm. Normal affect.   Procedures:  5/30-I&D of right foot complex abscess and partial right first ray resection by [name]   Microbiology summarized: 5/28-COVID-19 and influenza PCR nonreactive. 5/28-blood culture with staph species (not aureus) in 1 out of 2 bottles 5/30-blood cultures NGTD 5/30-surgical wound culture with pansensitive E. coli and moderate Streptococcus agalactiae   Assessment & Plan: Right foot cellulitis/abscess/osteomyelitis/diabetic wound infection-MRI right foot concerning for cellulitis, abscess and osteomyelitis.  Inflammatory markers elevated.  [name] with noncompressible PTA.  Culture data as above. S/p I&D and partial right first ray resection -Podiatry and infectious disease following -Vancomycin, cefepime and Flagyl>> IV Ancef and Flagyl -Plan for return to the OR today. -PT/OT eval after surgery  Positive blood culture: Blood culture from 5/28 with staph species  but not aureus in 1 out of 2 bottles which is likely contaminant.  Repeat blood culture NGTD -Antibiotics as above   Controlled NIDDM-2 with hyperglycemia, neuropathy and diabetic wound infection: A1c 6.3%. Recent Labs  Lab 03/30/21 1119 03/30/21 1647 03/30/21 2113 03/31/21 0649 03/31/21 1144  GLUCAP 249* 179* 136* 129* 139*  -Continue  SSI-moderate -Continue NovoLog 4 units 3 times daily with meals -Continue statin, aspirin, Cymbalta and gabapentin  Uncontrolled hypertension: BP elevated but improved. -Continue home lisinopril, amlodipine -Continue hydralazine 75 mg 3 times daily -Add low-dose HCTZ -Hydralazine 25 mg p.o. as needed  PAD-[name] 0.98 but noncompressible PTA with monophasic waveforms in both PT and DP.  Not a smoker. -Aspirin and statin  Left BKA due to osteomyelitis over a year ago-stable -Supportive care  Hyperlipidemia: Total cholesterol 84.  HDL <10.  -Continue statin  Alcohol abuse: Reportedly quit drinking about 2 weeks POA.  Heavy drinker before that.  No withdrawal symptoms. -Continue CIWA with as needed Ativan -Continue thiamine's  Anxiety and depression: Stable -Resume home Xanax at 0.5 mg 3 times daily as needed.  Narcotic database reviewed.   Morbid obesity Body mass index is 37.23 kg/m.  -Encourage lifestyle change to lose weight -Could benefit from GLP-1 inhibitors       DVT prophylaxis:  heparin injection 5,000 Units Start: 03/27/21 2200 SCDs Start: 03/27/21 2130  Code Status: Full code Family Communication: Patient and/or RN. Available if any question.  Level of care: Med-Surg Status is: Inpatient  Remains inpatient appropriate because:Ongoing diagnostic testing needed not appropriate for outpatient work up, IV treatments appropriate due to intensity of illness or inability to take PO and Inpatient level of care appropriate due to severity of illness   Dispo: The patient is from: Home              Anticipated d/c is to: Home              Patient currently is not medically stable to d/c.   Difficult to place patient No       Consultants:  Podiatry Infectious disease   Sch Meds:  Scheduled Meds: . amLODipine  10 mg Oral Daily  . aspirin EC  81 mg Oral Daily  . atorvastatin  40 mg Oral QPM  . folic acid  1 mg Oral Daily  . heparin  5,000 Units Subcutaneous  Q8H  . hydrALAZINE  75 mg Oral Q8H  . hydrochlorothiazide  12.5 mg Oral Daily  . insulin aspart  0-15 Units Subcutaneous TID WC  . insulin aspart  0-5 Units Subcutaneous QHS  . insulin aspart  4 Units Subcutaneous TID WC  . lisinopril  40 mg Oral QHS  . metroNIDAZOLE  500 mg Oral Q12H  . multivitamin with minerals  1 tablet Oral Daily  . thiamine  100 mg Oral Daily   Or  . thiamine  100 mg Intravenous Daily   Continuous Infusions: .  ceFAZolin (ANCEF) IV     PRN Meds:.acetaminophen **OR** acetaminophen, ALPRAZolam, hydrALAZINE, ondansetron **OR** ondansetron (ZOFRAN) IV, oxyCODONE, polyethylene glycol, traZODone  Antimicrobials: Anti-infectives (From admission, onward)   Start     Dose/Rate Route Frequency Ordered Stop   03/31/21 2200  metroNIDAZOLE (FLAGYL) tablet 500 mg        500 mg Oral Every 12 hours 03/31/21 1324     03/31/21 2000  ceFAZolin (ANCEF) IVPB 2g/100 mL premix        2 g 200 mL/hr over 30 Minutes Intravenous Every 8 hours  03/31/21 1319     03/29/21 2200  vancomycin (VANCOREADY) IVPB 1250 mg/250 mL  Status:  Discontinued        1,250 mg 166.7 mL/hr over 90 Minutes Intravenous Every 12 hours 03/29/21 1939 03/30/21 1615   03/29/21 1513  vancomycin (VANCOCIN) powder  Status:  Discontinued          As needed 03/29/21 1514 03/29/21 1624   03/29/21 1145  ceFEPIme (MAXIPIME) 2 g in sodium chloride 0.9 % 100 mL IVPB  Status:  Discontinued        2 g 200 mL/hr over 30 Minutes Intravenous Every 8 hours 03/29/21 1050 03/31/21 1225   03/29/21 1130  metroNIDAZOLE (FLAGYL) IVPB 500 mg  Status:  Discontinued        500 mg 100 mL/hr over 60 Minutes Intravenous Every 8 hours 03/29/21 1035 03/31/21 1324   03/29/21 1101  vancomycin variable dose per unstable renal function (pharmacist dosing)  Status:  Discontinued         Does not apply See admin instructions 03/29/21 1105 03/29/21 1941   03/28/21 1900  cefTRIAXone (ROCEPHIN) 2 g in sodium chloride 0.9 % 100 mL IVPB  Status:   Discontinued        2 g 200 mL/hr over 30 Minutes Intravenous Every 24 hours 03/27/21 2129 03/29/21 1035   03/28/21 1800  vancomycin (VANCOREADY) IVPB 2000 mg/400 mL        2,000 mg 200 mL/hr over 120 Minutes Intravenous Every 12 hours 03/28/21 0922 03/29/21 1153   03/28/21 0600  vancomycin (VANCOREADY) IVPB 1250 mg/250 mL  Status:  Discontinued        1,250 mg 166.7 mL/hr over 90 Minutes Intravenous Every 12 hours 03/27/21 2118 03/28/21 0922   03/27/21 1800  vancomycin (VANCOREADY) IVPB 2000 mg/400 mL        2,000 mg 200 mL/hr over 120 Minutes Intravenous  Once 03/27/21 1737 03/27/21 2132   03/27/21 1730  vancomycin (VANCOCIN) IVPB 1000 mg/200 mL premix  Status:  Discontinued        1,000 mg 200 mL/hr over 60 Minutes Intravenous  Once 03/27/21 1720 03/27/21 1737   03/27/21 1730  cefTRIAXone (ROCEPHIN) 2 g in sodium chloride 0.9 % 100 mL IVPB        2 g 200 mL/hr over 30 Minutes Intravenous  Once 03/27/21 1720 03/27/21 2132       I have personally reviewed the following labs and images: CBC: Recent Labs  Lab 03/27/21 1805 03/29/21 0210 03/29/21 1310 03/30/21 0341 03/31/21 0328  WBC 7.6 7.1 9.0 9.2 7.8  NEUTROABS 6.2  --   --   --   --   HGB 12.4* 9.6* 9.7* 8.5* 8.9*  HCT 38.1* 28.3* 28.5* 26.1* 26.7*  MCV 88.6 86.3 86.6 89.1 87.8  PLT 218 232 254 292 314   BMP &GFR Recent Labs  Lab 03/27/21 1805 03/28/21 0655 03/29/21 0210 03/29/21 1310 03/30/21 0341 03/31/21 0328  NA 131*  --  134* 134* 134* 137  K 3.6  --  2.8* 3.1* 3.5 4.1  CL 96*  --  104 107 108 106  CO2 24  --  21* 22 19* 25  GLUCOSE 176*  --  129* 145* 131* 134*  BUN 17  --  13 11 9 6   CREATININE 1.08  --  0.85 0.75 0.81 0.92  CALCIUM 8.7*  --  8.1* 8.3* 8.1* 8.6*  MG  --  1.8  --  1.9 1.8 2.0  PHOS  --   --   --  2.9 3.0 2.8   Estimated Creatinine Clearance: 129.2 mL/min (by C-G formula based on SCr of 0.92 mg/dL). Liver & Pancreas: Recent Labs  Lab 03/27/21 1805 03/29/21 0210 03/29/21 1310  03/30/21 0341 03/31/21 0328  AST 67* 49*  --   --  23  ALT 29 30  --   --  19  ALKPHOS 93 71  --   --  66  BILITOT 0.7 0.3  --   --  0.3  PROT 8.6* 6.1*  --   --  6.5  ALBUMIN 3.1* 2.1* 2.2* 2.0* 2.2*   No results for input(s): LIPASE, AMYLASE in the last 168 hours. No results for input(s): AMMONIA in the last 168 hours. Diabetic: Recent Labs    03/29/21 1310  HGBA1C 6.3*   Recent Labs  Lab 03/30/21 1119 03/30/21 1647 03/30/21 2113 03/31/21 0649 03/31/21 1144  GLUCAP 249* 179* 136* 129* 139*   Cardiac Enzymes: No results for input(s): CKTOTAL, CKMB, CKMBINDEX, TROPONINI in the last 168 hours. No results for input(s): PROBNP in the last 8760 hours. Coagulation Profile: Recent Labs  Lab 03/27/21 1805  INR 1.1   Thyroid Function Tests: No results for input(s): TSH, T4TOTAL, FREET4, T3FREE, THYROIDAB in the last 72 hours. Lipid Profile: Recent Labs    03/30/21 0341  CHOL 84  HDL <10*  LDLCALC NOT CALCULATED  TRIG 182*  CHOLHDL UNABLE TO CALCULATE.   Anemia Panel: No results for input(s): VITAMINB12, FOLATE, FERRITIN, TIBC, IRON, RETICCTPCT in the last 72 hours. Urine analysis:    Component Value Date/Time   COLORURINE STRAW (A) 03/29/2021 [place] 03/29/2021 1705   LABSPEC 1.010 03/29/2021 1705   PHURINE 6.0 03/29/2021 1705   GLUCOSEU NEGATIVE 03/29/2021 1705   HGBUR MODERATE (A) 03/29/2021 1705   BILIRUBINUR NEGATIVE 03/29/2021 [place] 03/29/2021 1705   PROTEINUR NEGATIVE 03/29/2021 1705   UROBILINOGEN 4.0 (H) 10/09/2008 1114   NITRITE NEGATIVE 03/29/2021 1705   LEUKOCYTESUR NEGATIVE 03/29/2021 1705   Sepsis Labs: Invalid input(s): PROCALCITONIN, [place]  Microbiology: Recent Results (from the past 240 hour(s))  Blood Culture (routine x 2)     Status: Abnormal   Collection Time: 03/27/21  6:05 PM   Specimen: Right Antecubital; Blood  Result Value Ref Range Status   Specimen Description   Final    RIGHT  ANTECUBITAL Performed at [facility], [address], [place], [place] [address]    Special Requests   Final    Blood Culture adequate volume BOTTLES DRAWN AEROBIC AND ANAEROBIC Performed at [facility], [address] [place], [place] [address]    Culture  Setup Time   Final    GRAM POSITIVE COCCI Gram Stain Report Called to,Read Back By and Verified With: RACHEAL TATE,RN @1154  03/28/2021 [facility] AEROBIC BOTTLE ONLY CRITICAL RESULT CALLED TO, READ BACK BY AND VERIFIED WITH: VICTORIA BASS RN @1627  03/28/21 EB ANAEROBIC BOTTLE ONLY GRAM POSITIVE COCCI Gram Stain Report Called to,Read Back By and Verified With: PREVIOUSLY CALLED @1154  03/28/2021 TO RACHEAL TATE,RN    Culture (A)  Final    STAPHYLOCOCCUS HOMINIS THE SIGNIFICANCE OF ISOLATING THIS ORGANISM FROM A SINGLE SET OF BLOOD CULTURES WHEN MULTIPLE SETS ARE DRAWN IS UNCERTAIN. PLEASE NOTIFY THE MICROBIOLOGY DEPARTMENT WITHIN ONE WEEK IF SPECIATION AND SENSITIVITIES ARE REQUIRED. Performed at [facility], [place] [address] [place], [place] [address]    Report Status 03/30/2021 FINAL  Final  Blood Culture ID Panel (Reflexed)     Status: Abnormal  Collection Time: 03/27/21  6:05 PM  Result Value Ref Range Status   Enterococcus faecalis NOT DETECTED NOT DETECTED Final   Enterococcus Faecium NOT DETECTED NOT DETECTED Final   Listeria monocytogenes NOT DETECTED NOT DETECTED Final   Staphylococcus species DETECTED (A) NOT DETECTED Final    Comment: CRITICAL RESULT CALLED TO, READ BACK BY AND VERIFIED WITH: VICTORIA BASS RN @1627  03/28/21 EB    Staphylococcus aureus (BCID) NOT DETECTED NOT DETECTED Final   Staphylococcus epidermidis NOT DETECTED NOT DETECTED Final   Staphylococcus lugdunensis NOT DETECTED NOT DETECTED Final   Streptococcus species NOT DETECTED NOT DETECTED Final   Streptococcus agalactiae NOT DETECTED NOT DETECTED Final   Streptococcus pneumoniae NOT DETECTED NOT DETECTED Final   Streptococcus pyogenes NOT DETECTED  NOT DETECTED Final   A.calcoaceticus-baumannii NOT DETECTED NOT DETECTED Final   Bacteroides fragilis NOT DETECTED NOT DETECTED Final   Enterobacterales NOT DETECTED NOT DETECTED Final   Enterobacter cloacae complex NOT DETECTED NOT DETECTED Final   Escherichia coli NOT DETECTED NOT DETECTED Final   Klebsiella aerogenes NOT DETECTED NOT DETECTED Final   Klebsiella oxytoca NOT DETECTED NOT DETECTED Final   Klebsiella pneumoniae NOT DETECTED NOT DETECTED Final   Proteus species NOT DETECTED NOT DETECTED Final   Salmonella species NOT DETECTED NOT DETECTED Final   Serratia marcescens NOT DETECTED NOT DETECTED Final   Haemophilus influenzae NOT DETECTED NOT DETECTED Final   Neisseria meningitidis NOT DETECTED NOT DETECTED Final   Pseudomonas aeruginosa NOT DETECTED NOT DETECTED Final   Stenotrophomonas maltophilia NOT DETECTED NOT DETECTED Final   Candida albicans NOT DETECTED NOT DETECTED Final   Candida auris NOT DETECTED NOT DETECTED Final   Candida glabrata NOT DETECTED NOT DETECTED Final   Candida krusei NOT DETECTED NOT DETECTED Final   Candida parapsilosis NOT DETECTED NOT DETECTED Final   Candida tropicalis NOT DETECTED NOT DETECTED Final   Cryptococcus neoformans/gattii NOT DETECTED NOT DETECTED Final    Comment: Performed at [facility], [address], [place], [place] [address]  Resp Panel by RT-PCR (Flu A&B, Covid) Nasopharyngeal Swab     Status: None   Collection Time: 03/27/21  6:07 PM   Specimen: Nasopharyngeal Swab; Nasopharyngeal(NP) swabs in vial transport medium  Result Value Ref Range Status   SARS Coronavirus 2 by RT PCR NEGATIVE NEGATIVE Final    Comment: (NOTE) SARS-CoV-2 target nucleic acids are NOT DETECTED.  The SARS-CoV-2 RNA is generally detectable in upper respiratory specimens during the acute phase of infection. The lowest concentration of SARS-CoV-2 viral copies this assay can detect is 138 copies/mL. A negative result does not preclude  SARS-Cov-2 infection and should not be used as the sole basis for treatment or other patient management decisions. A negative result may occur with  improper specimen collection/handling, submission of specimen other than nasopharyngeal swab, presence of viral mutation(s) within the areas targeted by this assay, and inadequate number of viral copies(<138 copies/mL). A negative result must be combined with clinical observations, patient history, and epidemiological information. The expected result is Negative.  Fact Sheet for Patients:  EntrepreneurPulse.com.au  Fact Sheet for Healthcare Providers:  IncredibleEmployment.be  This test is no t yet approved or cleared by the [place] FDA and  has been authorized for detection and/or diagnosis of SARS-CoV-2 by FDA under an Emergency Use Authorization (EUA). This EUA will remain  in effect (meaning this test can be used) for the duration of the COVID-19 declaration under Section 564(b)(1) of the Act, 21 U.S.C.section 360bbb-3(b)(1),  unless the authorization is terminated  or revoked sooner.       Influenza A by PCR NEGATIVE NEGATIVE Final   Influenza B by PCR NEGATIVE NEGATIVE Final    Comment: (NOTE) The Xpert Xpress SARS-CoV-2/FLU/RSV plus assay is intended as an aid in the diagnosis of influenza from Nasopharyngeal swab specimens and should not be used as a sole basis for treatment. Nasal washings and aspirates are unacceptable for Xpert Xpress SARS-CoV-2/FLU/RSV testing.  Fact Sheet for Patients: EntrepreneurPulse.com.au  Fact Sheet for Healthcare Providers: IncredibleEmployment.be  This test is not yet approved or cleared by the [place] FDA and has been authorized for detection and/or diagnosis of SARS-CoV-2 by FDA under an Emergency Use Authorization (EUA). This EUA will remain in effect (meaning this test can be used) for the duration of  the COVID-19 declaration under Section 564(b)(1) of the Act, 21 U.S.C. section 360bbb-3(b)(1), unless the authorization is terminated or revoked.  Performed at [facility], [address], [place], [place] [address]   Blood Culture (routine x 2)     Status: None (Preliminary result)   Collection Time: 03/27/21  6:20 PM   Specimen: BLOOD LEFT HAND  Result Value Ref Range Status   Specimen Description BLOOD LEFT HAND  Final   Special Requests   Final    Blood Culture adequate volume BOTTLES DRAWN AEROBIC AND ANAEROBIC   Culture   Final    NO GROWTH 4 DAYS Performed at [facility], [address] [place], [place] [address]    Report Status PENDING  Incomplete  Culture, blood (Routine X 2) w Reflex to ID Panel     Status: None (Preliminary result)   Collection Time: 03/29/21  2:00 AM   Specimen: BLOOD RIGHT HAND  Result Value Ref Range Status   Specimen Description BLOOD RIGHT HAND  Final   Special Requests   Final    BOTTLES DRAWN AEROBIC AND ANAEROBIC Blood Culture adequate volume   Culture   Final    NO GROWTH 2 DAYS Performed at [facility], [place] [address] [place], [place] [address]    Report Status PENDING  Incomplete  Culture, blood (Routine X 2) w Reflex to ID Panel     Status: None (Preliminary result)   Collection Time: 03/29/21  2:08 AM   Specimen: BLOOD LEFT HAND  Result Value Ref Range Status   Specimen Description BLOOD LEFT HAND  Final   Special Requests   Final    BOTTLES DRAWN AEROBIC AND ANAEROBIC Blood Culture adequate volume   Culture   Final    NO GROWTH 2 DAYS Performed at [facility], [place] [address] [place], [place] [address]    Report Status PENDING  Incomplete  Aerobic/Anaerobic Culture w Gram Stain (surgical/deep wound)     Status: None (Preliminary result)   Collection Time: 03/29/21  3:50 PM   Specimen: PATH Soft tissue  Result Value Ref Range Status   Specimen Description TISSUE RIGHT TOE  Final   Special Requests RT GREAT TOE  SWAB  Final   Gram Stain   Final    NO WBC SEEN MODERATE GRAM POSITIVE COCCI RARE GRAM NEGATIVE RODS    Culture   Final    ABUNDANT ESCHERICHIA COLI MODERATE STREPTOCOCCUS AGALACTIAE TESTING AGAINST S. AGALACTIAE NOT ROUTINELY PERFORMED DUE TO PREDICTABILITY OF AMP/PEN/VAN SUSCEPTIBILITY. HOLDING FOR POSSIBLE ANAEROBE Performed at [facility], [place] [address] [place], [place] [address]    Report Status PENDING  Incomplete  Organism ID, Bacteria ESCHERICHIA COLI  Final      Susceptibility   Escherichia coli - MIC*    AMPICILLIN <=2 SENSITIVE Sensitive     CEFAZOLIN <=4 SENSITIVE Sensitive     CEFEPIME <=0.12 SENSITIVE Sensitive     CEFTAZIDIME <=1 SENSITIVE Sensitive     CEFTRIAXONE <=0.25 SENSITIVE Sensitive     CIPROFLOXACIN <=0.25 SENSITIVE Sensitive     GENTAMICIN <=1 SENSITIVE Sensitive     IMIPENEM <=0.25 SENSITIVE Sensitive     TRIMETH/SULFA <=20 SENSITIVE Sensitive     AMPICILLIN/SULBACTAM <=2 SENSITIVE Sensitive     PIP/TAZO <=4 SENSITIVE Sensitive     * ABUNDANT ESCHERICHIA COLI  Aerobic/Anaerobic Culture w Gram Stain (surgical/deep wound)     Status: None (Preliminary result)   Collection Time: 03/29/21  4:04 PM   Specimen: Soft Tissue, Other  Result Value Ref Range Status   Specimen Description TISSUE  Final   Special Requests RT 1ST METATARSAL  Final   Gram Stain NO WBC SEEN NO ORGANISMS SEEN   Final   Culture   Final    NO GROWTH 2 DAYS NO ANAEROBES ISOLATED; CULTURE IN PROGRESS FOR 5 DAYS Performed at [facility], [address] [place], [place] [address]    Report Status PENDING  Incomplete  Urine culture     Status: None   Collection Time: 03/29/21  6:09 PM   Specimen: Urine, Clean Catch  Result Value Ref Range Status   Specimen Description URINE, CLEAN CATCH  Final   Special Requests NONE  Final   Culture   Final    NO GROWTH Performed at [facility], [place] [address], [place], [place] [address]    Report Status 03/31/2021  FINAL  Final    Radiology Studies: No results found.    Chester Romero T. Daguao  If 7PM-7AM, please contact night-coverage [email or web address] 03/31/2021, 2:52 PM

## 2021-03-31 NOTE — TOC Initial Note (Signed)
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 Antibiotics de-escalated to Ancef and Flagyl.  Patient to return to the OR for further I&D on 6/1. Per notes, he was drinking nearly a gallon of hard liquor per day.  He says he now quit and is confident that he can stay sober after being discharged in the hospital.  Barriers to discharge and home health include payor source and ETOH abuse. Establishing home health services is unlikely.     Expected Discharge Plan: Home/Self Care Barriers to Discharge: Continued Medical Work up,No [facility] will accept this patient   Patient Goals and CMS Choice        Expected Discharge Plan and Services Expected Discharge Plan: Home/Self Care       Living arrangements for the past 2 months: Single Family Home                                      Prior Living Arrangements/Services Living arrangements for the past 2 months: Single Family Home Lives with:: Self                   Activities of Daily Living Home Assistive Devices/Equipment: Crutches ADL Screening (condition at time of admission) Patient's cognitive ability adequate to safely complete daily activities?: Yes Is the patient deaf or have difficulty hearing?: No Does the patient have difficulty seeing, even when wearing glasses/contacts?: No Does the patient have difficulty concentrating, remembering, or making decisions?: No Patient able to express need for assistance with ADLs?: Yes Does the patient have difficulty dressing or bathing?: No Independently performs ADLs?: Yes (appropriate for developmental age) Does the patient  have difficulty walking or climbing stairs?: Yes Weakness of Legs: Right Weakness of Arms/Hands: None  Permission Sought/Granted                  Emotional Assessment              Admission diagnosis:  Osteomyelitis ([place]) [M86.9] Osteomyelitis of foot, acute ([place]) [M86.179] Diabetic foot infection ([place]) [Z56.387, L08.9] Patient Active Problem List   Diagnosis Date Noted  . Diabetic wet gangrene of the foot ([place])   . Osteomyelitis ([place]) 03/27/2021  . Unilateral complete BKA, left, initial encounter ([place]) 06/30/2020  . Severe protein-calorie malnutrition ([place])   . Acute blood loss anemia 01/04/2020  . Asthma   . Depression with anxiety   . Hyperglycemia 12/17/2019  . Diabetic foot ulcer ([place])   . Anemia, chronic disease 11/10/2019  . Marijuana use 11/10/2019  . Wound dehiscence 08/31/2019  . 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 Sepsis ([place]) 08/14/2019  . Hyponatremia 08/14/2019  . Normocytic anemia 08/14/2019  . B12 deficiency 08/14/2019  . Subacute osteomyelitis, left ankle and foot ([place])   . Cellulitis of toe, left   . Cellulitis 07/12/2019  . GERD (gastroesophageal reflux disease) 12/19/2018  . Hepatic steatosis 06/18/2018  . Gastritis, erosive   . Dysphagia, idiopathic 12/27/2017  . Rectal bleeding 12/27/2017  . Intention tremor  06/03/2016  . Left knee pain 12/04/2015  . 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 Diabetic neuropathy ([place]) 10/08/2014  . Chest pain, rule out acute myocardial infarction 03/10/2014  . Thrombocytopenia ([place]) 03/10/2014  . ETOH abuse 03/10/2014  . Chest pain 03/10/2014  . Diabetes mellitus without complication ([place])   . Hypertension associated with type 2 diabetes mellitus ([place])   . Hyperlipidemia associated with type 2 diabetes mellitus ([place])    PCP:  [name], PA-C Pharmacy:   [facility] [address] [place]  [address] Phone: [phone] Fax: [phone]     Social Determinants of Health (SDOH) Interventions    Readmission Risk Interventions Readmission Risk Prevention Plan 06/25/2020  Transportation Screening Complete  Medication Review ([place]) Complete  PCP or Specialist appointment within 3-5 days of discharge Complete  HRI or Home Care Consult Complete  SW Recovery Care/Counseling Consult Complete  Palliative Care Screening Complete  Skilled Nursing Facility Complete  Some recent data might be hidden

## 2021-03-31 NOTE — Plan of Care (Signed)
  Problem: Clinical Measurements: Goal: Will remain free from infection Outcome: Progressing   Problem: Clinical Measurements: Goal: Diagnostic test results will improve Outcome: Progressing   Problem: Clinical Measurements: Goal: Respiratory complications will improve Outcome: Progressing   Problem: Pain Managment: Goal: General experience of comfort will improve Outcome: Progressing   Problem: Safety: Goal: Ability to remain free from injury will improve Outcome: Progressing   Problem: Skin Integrity: Goal: Risk for impaired skin integrity will decrease Outcome: Progressing

## 2021-04-01 ENCOUNTER — Inpatient Hospital Stay (HOSPITAL_COMMUNITY): Payer: Medicaid Other | Admitting: Anesthesiology

## 2021-04-01 ENCOUNTER — Inpatient Hospital Stay: Payer: Self-pay

## 2021-04-01 ENCOUNTER — Encounter (HOSPITAL_COMMUNITY): Payer: Self-pay | Admitting: Family Medicine

## 2021-04-01 ENCOUNTER — Encounter (HOSPITAL_COMMUNITY)
Admission: EM | Disposition: A | Discharge status: Home / Self Care | Payer: Self-pay | Source: Home / Self Care | Attending: Student

## 2021-04-01 DIAGNOSIS — E11621 Type 2 diabetes mellitus with foot ulcer: Secondary | ICD-10-CM | POA: Diagnosis not present

## 2021-04-01 DIAGNOSIS — E1141 Type 2 diabetes mellitus with diabetic mononeuropathy: Secondary | ICD-10-CM | POA: Diagnosis not present

## 2021-04-01 DIAGNOSIS — E1169 Type 2 diabetes mellitus with other specified complication: Secondary | ICD-10-CM | POA: Diagnosis not present

## 2021-04-01 DIAGNOSIS — G621 Alcoholic polyneuropathy: Secondary | ICD-10-CM | POA: Diagnosis not present

## 2021-04-01 DIAGNOSIS — M86671 Other chronic osteomyelitis, right ankle and foot: Secondary | ICD-10-CM

## 2021-04-01 HISTORY — PX: WOUND DEBRIDEMENT: SHX247

## 2021-04-01 LAB — GLUCOSE, CAPILLARY
Glucose-Capillary: 145 mg/dL — ABNORMAL HIGH (ref 70–99)
Glucose-Capillary: 203 mg/dL — ABNORMAL HIGH (ref 70–99)
Glucose-Capillary: 139 mg/dL — ABNORMAL HIGH (ref 70–99)
Glucose-Capillary: 161 mg/dL — ABNORMAL HIGH (ref 70–99)
Glucose-Capillary: 227 mg/dL — ABNORMAL HIGH (ref 70–99)

## 2021-04-01 LAB — CBC
HCT: 27.5 % — ABNORMAL LOW (ref 39.0–52.0)
Hemoglobin: 9.2 g/dL — ABNORMAL LOW (ref 13.0–17.0)
MCH: 29.1 pg (ref 26.0–34.0)
MCHC: 33.5 g/dL (ref 30.0–36.0)
MCV: 87 fL (ref 80.0–100.0)
Platelets: 376 10*3/uL (ref 150–400)
RBC: 3.16 MIL/uL — ABNORMAL LOW (ref 4.22–5.81)
RDW: 12.9 % (ref 11.5–15.5)
WBC: 7.7 10*3/uL (ref 4.0–10.5)
nRBC: 0 % (ref 0.0–0.2)

## 2021-04-01 LAB — CULTURE, BLOOD (ROUTINE X 2)
Culture: NO GROWTH
Special Requests: ADEQUATE

## 2021-04-01 SURGERY — DEBRIDEMENT, WOUND
Anesthesia: Monitor Anesthesia Care | Site: Foot | Laterality: Right

## 2021-04-01 MED ORDER — ACETAMINOPHEN 500 MG PO TABS
1000.0000 mg | ORAL_TABLET | Freq: Once | ORAL | Status: AC
  Administered 2021-04-01: 1000 mg via ORAL
  Filled 2021-04-01: qty 2

## 2021-04-01 MED ORDER — OXYCODONE HCL 5 MG PO TABS: 5.0000 mg | ORAL_TABLET | Freq: Once | ORAL | Status: DC | PRN

## 2021-04-01 MED ORDER — VANCOMYCIN HCL 1000 MG IV SOLR
INTRAVENOUS | Status: AC
  Filled 2021-04-01: qty 1000

## 2021-04-01 MED ORDER — MIDAZOLAM HCL 2 MG/2ML IJ SOLN
INTRAMUSCULAR | Status: AC
  Filled 2021-04-01: qty 2

## 2021-04-01 MED ORDER — BUPIVACAINE HCL (PF) 0.5 % IJ SOLN
INTRAMUSCULAR | Status: AC
  Filled 2021-04-01: qty 30

## 2021-04-01 MED ORDER — MIDAZOLAM HCL 5 MG/5ML IJ SOLN
INTRAMUSCULAR | Status: DC | PRN
  Administered 2021-04-01: 2 mg via INTRAVENOUS

## 2021-04-01 MED ORDER — PROPOFOL 10 MG/ML IV BOLUS
INTRAVENOUS | Status: DC | PRN
  Administered 2021-04-01: 140 mg via INTRAVENOUS

## 2021-04-01 MED ORDER — ORAL CARE MOUTH RINSE: 15.0000 mL | Freq: Once | OROMUCOSAL | Status: AC

## 2021-04-01 MED ORDER — FENTANYL CITRATE (PF) 250 MCG/5ML IJ SOLN
INTRAMUSCULAR | Status: AC
  Filled 2021-04-01: qty 5

## 2021-04-01 MED ORDER — HYDROMORPHONE HCL 1 MG/ML IJ SOLN: 0.2500 mg | INTRAMUSCULAR | Status: DC | PRN

## 2021-04-01 MED ORDER — CHLORHEXIDINE GLUCONATE 0.12 % MT SOLN: 15.0000 mL | Freq: Once | OROMUCOSAL | Status: AC

## 2021-04-01 MED ORDER — ONDANSETRON HCL 4 MG/2ML IJ SOLN: 4.0000 mg | Freq: Once | INTRAMUSCULAR | Status: DC | PRN

## 2021-04-01 MED ORDER — LACTATED RINGERS IV SOLN: INTRAVENOUS | Status: DC

## 2021-04-01 MED ORDER — 0.9 % SODIUM CHLORIDE (POUR BTL) OPTIME
TOPICAL | Status: DC | PRN
  Administered 2021-04-01: 1000 mL

## 2021-04-01 MED ORDER — FENTANYL CITRATE (PF) 100 MCG/2ML IJ SOLN
INTRAMUSCULAR | Status: DC | PRN
  Administered 2021-04-01 (×2): 50 ug via INTRAVENOUS

## 2021-04-01 MED ORDER — SUCCINYLCHOLINE CHLORIDE 200 MG/10ML IV SOSY
PREFILLED_SYRINGE | INTRAVENOUS | Status: DC | PRN
  Administered 2021-04-01: 120 mg via INTRAVENOUS

## 2021-04-01 MED ORDER — HYDRALAZINE HCL 50 MG PO TABS
100.0000 mg | ORAL_TABLET | Freq: Three times a day (TID) | ORAL | Status: DC
  Administered 2021-04-01 – 2021-04-02 (×3): 100 mg via ORAL
  Filled 2021-04-01 (×3): qty 2

## 2021-04-01 MED ORDER — BUPIVACAINE HCL (PF) 0.5 % IJ SOLN
INTRAMUSCULAR | Status: DC | PRN
  Administered 2021-04-01: 10 mL

## 2021-04-01 MED ORDER — HYDROCHLOROTHIAZIDE 25 MG PO TABS
25.0000 mg | ORAL_TABLET | Freq: Every day | ORAL | Status: DC
  Administered 2021-04-02: 25 mg via ORAL
  Filled 2021-04-01: qty 1

## 2021-04-01 MED ORDER — LIDOCAINE 2% (20 MG/ML) 5 ML SYRINGE
INTRAMUSCULAR | Status: DC | PRN
  Administered 2021-04-01: 100 mg via INTRAVENOUS

## 2021-04-01 MED ORDER — ONDANSETRON HCL 4 MG/2ML IJ SOLN
INTRAMUSCULAR | Status: DC | PRN
  Administered 2021-04-01: 4 mg via INTRAVENOUS

## 2021-04-01 MED ORDER — SODIUM CHLORIDE 0.9 % IR SOLN
Status: DC | PRN
  Administered 2021-04-01: 3000 mL

## 2021-04-01 MED ORDER — CHLORHEXIDINE GLUCONATE 0.12 % MT SOLN
OROMUCOSAL | Status: AC
  Administered 2021-04-01: 15 mL via OROMUCOSAL
  Filled 2021-04-01: qty 15

## 2021-04-01 MED ORDER — SUCCINYLCHOLINE CHLORIDE 200 MG/10ML IV SOSY
PREFILLED_SYRINGE | INTRAVENOUS | Status: AC
  Filled 2021-04-01: qty 10

## 2021-04-01 MED ORDER — SODIUM CHLORIDE 0.9 % IV SOLN
INTRAVENOUS | Status: DC | PRN
  Administered 2021-04-01: 250 mL via INTRAVENOUS
  Administered 2021-04-01: 500 mL via INTRAVENOUS
  Administered 2021-04-02: 250 mL via INTRAVENOUS

## 2021-04-01 MED ORDER — LIDOCAINE 2% (20 MG/ML) 5 ML SYRINGE
INTRAMUSCULAR | Status: AC
  Filled 2021-04-01: qty 10

## 2021-04-01 MED ORDER — OXYCODONE HCL 5 MG/5ML PO SOLN: 5.0000 mg | Freq: Once | ORAL | Status: DC | PRN

## 2021-04-01 SURGICAL SUPPLY — 49 items
APL PRP STRL LF DISP 70% ISPRP (MISCELLANEOUS) ×1
BNDG CMPR 9X4 STRL LF SNTH (GAUZE/BANDAGES/DRESSINGS)
BNDG ELASTIC 4X5.8 VLCR STR LF (GAUZE/BANDAGES/DRESSINGS) ×2 IMPLANT
BNDG ESMARK 4X9 LF (GAUZE/BANDAGES/DRESSINGS) IMPLANT
BNDG GAUZE ELAST 4 BULKY (GAUZE/BANDAGES/DRESSINGS) ×2 IMPLANT
CHLORAPREP W/TINT 26 (MISCELLANEOUS) ×3 IMPLANT
COVER SURGICAL LIGHT HANDLE (MISCELLANEOUS) ×3 IMPLANT
COVER WAND RF STERILE (DRAPES) ×3 IMPLANT
CUFF TOURN SGL QUICK 18X4 (TOURNIQUET CUFF) IMPLANT
CUFF TOURN SGL QUICK 34 (TOURNIQUET CUFF)
CUFF TRNQT CYL 34X4.125X (TOURNIQUET CUFF) IMPLANT
DRAPE U-SHAPE 47X51 STRL (DRAPES) ×3 IMPLANT
ELECT CAUTERY BLADE 6.4 (BLADE) ×3 IMPLANT
ELECT REM PT RETURN 9FT ADLT (ELECTROSURGICAL) ×3
ELECTRODE REM PT RTRN 9FT ADLT (ELECTROSURGICAL) ×1 IMPLANT
GAUZE SPONGE 4X4 12PLY STRL (GAUZE/BANDAGES/DRESSINGS) IMPLANT
GAUZE SPONGE 4X4 12PLY STRL LF (GAUZE/BANDAGES/DRESSINGS) ×2 IMPLANT
GAUZE XEROFORM 5X9 LF (GAUZE/BANDAGES/DRESSINGS) ×2 IMPLANT
GLOVE BIO SURGEON STRL SZ7.5 (GLOVE) ×3 IMPLANT
GLOVE SRG 8 PF TXTR STRL LF DI (GLOVE) ×1 IMPLANT
GLOVE SURG UNDER POLY LF SZ8 (GLOVE) ×3
GOWN STRL REUS W/ TWL LRG LVL3 (GOWN DISPOSABLE) ×1 IMPLANT
GOWN STRL REUS W/ TWL XL LVL3 (GOWN DISPOSABLE) ×1 IMPLANT
GOWN STRL REUS W/TWL LRG LVL3 (GOWN DISPOSABLE) ×3
GOWN STRL REUS W/TWL XL LVL3 (GOWN DISPOSABLE) ×3
JET LAVAGE IRRISEPT WOUND (IRRIGATION / IRRIGATOR) ×3
KIT BASIN OR (CUSTOM PROCEDURE TRAY) ×3 IMPLANT
KIT TURNOVER KIT B (KITS) ×3 IMPLANT
LAVAGE JET IRRISEPT WOUND (IRRIGATION / IRRIGATOR) IMPLANT
MANIFOLD NEPTUNE II (INSTRUMENTS) ×3 IMPLANT
NDL BIOPSY JAMSHIDI 8X6 (NEEDLE) IMPLANT
NDL HYPO 25GX1X1/2 BEV (NEEDLE) IMPLANT
NEEDLE BIOPSY JAMSHIDI 8X6 (NEEDLE) IMPLANT
NEEDLE HYPO 25GX1X1/2 BEV (NEEDLE) IMPLANT
NS IRRIG 1000ML POUR BTL (IV SOLUTION) ×3 IMPLANT
PACK ORTHO EXTREMITY (CUSTOM PROCEDURE TRAY) ×3 IMPLANT
PAD ARMBOARD 7.5X6 YLW CONV (MISCELLANEOUS) ×6 IMPLANT
PROBE DEBRIDE SONICVAC MISONIX (TIP) IMPLANT
SET CYSTO W/LG BORE CLAMP LF (SET/KITS/TRAYS/PACK) ×3 IMPLANT
SOL PREP POV-IOD 4OZ 10% (MISCELLANEOUS) ×6 IMPLANT
STAPLER VISISTAT 35W (STAPLE) ×2 IMPLANT
SUT ETHILON 2 0 FS 18 (SUTURE) ×8 IMPLANT
SUT MNCRL AB 3-0 PS2 27 (SUTURE) ×2 IMPLANT
SYR CONTROL 10ML LL (SYRINGE) IMPLANT
TOWEL GREEN STERILE (TOWEL DISPOSABLE) ×3 IMPLANT
TOWEL GREEN STERILE FF (TOWEL DISPOSABLE) ×3 IMPLANT
TUBE CONNECTING 12'X1/4 (SUCTIONS) ×1
TUBE CONNECTING 12X1/4 (SUCTIONS) ×2 IMPLANT
YANKAUER SUCT BULB TIP NO VENT (SUCTIONS) ×3 IMPLANT

## 2021-04-01 NOTE — Progress Notes (Signed)
PROGRESS NOTE  Eugene Diaz YKD:[id number] DOB: 1966/09/28   PCP: [name], PA-C  Patient is from: Home  DOA: 03/27/2021 LOS: 5  Chief complaints: Left foot/wound infection  Brief Narrative / Interim history: 55 year old M with PMH of NIDDM-2, PVD, left BKA, neuropathy, chronic right foot ulcer, alcohol abuse, anxiety and depression presented to [facility] with right great toe swelling, discoloration, malodorous discharge and worsening right foot ulcer over the ball of his right foot, and admitted for right foot osteomyelitis with right foot diabetic wound infection.  Cultures obtained.  He was a started on ceftriaxone and vancomycin.  X-ray concerning for osteomyelitis in right great toe.  He was evaluated by general surgery, and transferred to [facility] for evaluation and management by podiatry.  MRI of right foot consistent with right foot cellulitis, abscess and osteomyelitis of the right great toe.  Antibiotics broadened to cefepime, Flagyl and vancomycin.   Patient underwent I&D of right foot complex abscess and partial right first ray resection by [name] on 5/30.  Surgical culture with E. coli and moderate Streptococcus agalactiae.  Antibiotics de-escalated to Ancef and Flagyl.  Patient to return to the OR for further I&D on 6/2  Subjective: Seen and examined in the morning. No major events overnight or this morning. Very eager to go home but understands that he has to go back to OR.   Objective: Vitals:   03/31/21 1722 03/31/21 2113 04/01/21 0525 04/01/21 0903  BP: (!) 169/83 (!) 141/76 (!) 173/87 (!) 171/75  Pulse: 76 78 72 90  Resp: 18 18 16 17   Temp: 98.4 F (36.9 C) 98 F (36.7 C) 98.2 F (36.8 C) 98.2 F (36.8 C)  TempSrc:   Oral   SpO2: 98% 96% 98% 98%  Weight:      Height:        Intake/Output Summary (Last 24 hours) at 04/01/2021 1249 Last data filed at 04/01/2021 1244 Gross per 24 hour  Intake 678.18 ml  Output 2051 ml  Net -1372.82 ml   Filed Weights    03/27/21 1656  Weight: 131.5 kg    Examination:  GENERAL: No acute distress.  Appears well.  HEENT: MMM.  Vision and hearing grossly intact.  NECK: Supple.  No apparent JVD.  RESP:  No IWOB. Good air movement bilaterally. CVS:  RRR. Heart sounds normal.  ABD/GI/GU: Bowel sounds present. Soft. Non tender.  MSK/EXT:  Left BKA. Dressing over right foot DCI SKIN: dressing over left foot DCI NEURO: Awake, alert and oriented appropriately.  No apparent focal neuro deficit. PSYCH: Calm. Normal affect.    Procedures:  5/30-I&D of right foot complex abscess and partial right first ray resection by [name]   Microbiology summarized: 5/28-COVID-19 and influenza PCR nonreactive. 5/28-blood culture with staph species (not aureus) in 1 out of 2 bottles 5/30-blood cultures NGTD 5/30-surgical wound culture with pansensitive E. coli and moderate Streptococcus agalactiae   Assessment & Plan: Right foot cellulitis/abscess/osteomyelitis/diabetic wound infection-MRI right foot concerning for cellulitis, abscess and osteomyelitis.  Inflammatory markers elevated.  [name] with noncompressible PTA.  Culture data as above. S/p I&D and partial right first ray resection -Podiatry and infectious disease following -Vancomycin, cefepime and Flagyl>> IV Ancef and PO Flagyl -Plan for return to the OR today. -PT/OT eval after surgery  Positive blood culture: Blood culture from 5/28 with staph species but not aureus in 1 out of 2 bottles which is likely contaminant.  Repeat blood culture NGTD -Antibiotics as above   Controlled  NIDDM-2 with hyperglycemia, neuropathy and diabetic wound infection: A1c 6.3%. Recent Labs  Lab 03/31/21 1144 03/31/21 1626 03/31/21 2112 04/01/21 0638 04/01/21 1148  GLUCAP 139* 133* 275* 145* 203*  -Continue SSI-moderate -Continue NovoLog 4 units 3 times daily with meals -Continue statin, aspirin, Cymbalta and gabapentin  Uncontrolled hypertension: BP elevated but  improved. -Continue home lisinopril, amlodipine -Increase hydralazine to 100 mg 3 times daily -Increase HCT to 25 mg daily -Hydralazine 25 mg p.o. as needed  PAD-[name] 0.98 but noncompressible PTA with monophasic waveforms in both PT and DP.  Not a smoker. -Aspirin and statin  Left BKA due to osteomyelitis over a year ago-stable -Supportive care  Hyperlipidemia: Total cholesterol 84.  HDL <10.  -Continue statin  Alcohol abuse: Reportedly quit drinking about 2 weeks POA.  Heavy drinker before that.  No withdrawal symptoms. -Continue CIWA with as needed Ativan -Continue thiamine's  Anxiety and depression: Stable -Resume home Xanax at 0.5 mg 3 times daily as needed.  Narcotic database reviewed.   Morbid obesity Body mass index is 37.23 kg/m.  -Encourage lifestyle change to lose weight -Could benefit from GLP-1 inhibitors       DVT prophylaxis:  heparin injection 5,000 Units Start: 03/27/21 2200 SCDs Start: 03/27/21 2130  Code Status: Full code Family Communication: Patient and/or RN. Available if any question.  Level of care: Med-Surg Status is: Inpatient  Remains inpatient appropriate because:Ongoing diagnostic testing needed not appropriate for outpatient work up, IV treatments appropriate due to intensity of illness or inability to take PO and Inpatient level of care appropriate due to severity of illness   Dispo: The patient is from: Home              Anticipated d/c is to: Home              Patient currently is not medically stable to d/c.   Difficult to place patient No       Consultants:  Podiatry Infectious disease   Sch Meds:  Scheduled Meds: . amLODipine  10 mg Oral Daily  . aspirin EC  81 mg Oral Daily  . atorvastatin  40 mg Oral QPM  . Chlorhexidine Gluconate Cloth  6 each Topical Q0600  . folic acid  1 mg Oral Daily  . heparin  5,000 Units Subcutaneous Q8H  . hydrALAZINE  100 mg Oral Q8H  . hydrochlorothiazide  25 mg Oral Daily  . insulin  aspart  0-15 Units Subcutaneous TID WC  . insulin aspart  0-5 Units Subcutaneous QHS  . insulin aspart  4 Units Subcutaneous TID WC  . lisinopril  40 mg Oral QHS  . metroNIDAZOLE  500 mg Oral Q12H  . multivitamin with minerals  1 tablet Oral Daily  . mupirocin ointment  1 application Nasal BID  . thiamine  100 mg Oral Daily   Continuous Infusions: . sodium chloride 500 mL (04/01/21 0529)  .  ceFAZolin (ANCEF) IV 2 g (04/01/21 0530)   PRN Meds:.sodium chloride, acetaminophen **OR** acetaminophen, ALPRAZolam, hydrALAZINE, ondansetron **OR** ondansetron (ZOFRAN) IV, oxyCODONE, polyethylene glycol, traZODone  Antimicrobials: Anti-infectives (From admission, onward)   Start     Dose/Rate Route Frequency Ordered Stop   03/31/21 2200  metroNIDAZOLE (FLAGYL) tablet 500 mg        500 mg Oral Every 12 hours 03/31/21 1324     03/31/21 2000  ceFAZolin (ANCEF) IVPB 2g/100 mL premix        2 g 200 mL/hr over 30 Minutes Intravenous Every 8 hours  03/31/21 1319     03/29/21 2200  vancomycin (VANCOREADY) IVPB 1250 mg/250 mL  Status:  Discontinued        1,250 mg 166.7 mL/hr over 90 Minutes Intravenous Every 12 hours 03/29/21 1939 03/30/21 1615   03/29/21 1513  vancomycin (VANCOCIN) powder  Status:  Discontinued          As needed 03/29/21 1514 03/29/21 1624   03/29/21 1145  ceFEPIme (MAXIPIME) 2 g in sodium chloride 0.9 % 100 mL IVPB  Status:  Discontinued        2 g 200 mL/hr over 30 Minutes Intravenous Every 8 hours 03/29/21 1050 03/31/21 1225   03/29/21 1130  metroNIDAZOLE (FLAGYL) IVPB 500 mg  Status:  Discontinued        500 mg 100 mL/hr over 60 Minutes Intravenous Every 8 hours 03/29/21 1035 03/31/21 1324   03/29/21 1101  vancomycin variable dose per unstable renal function (pharmacist dosing)  Status:  Discontinued         Does not apply See admin instructions 03/29/21 1105 03/29/21 1941   03/28/21 1900  cefTRIAXone (ROCEPHIN) 2 g in sodium chloride 0.9 % 100 mL IVPB  Status:  Discontinued         2 g 200 mL/hr over 30 Minutes Intravenous Every 24 hours 03/27/21 2129 03/29/21 1035   03/28/21 1800  vancomycin (VANCOREADY) IVPB 2000 mg/400 mL        2,000 mg 200 mL/hr over 120 Minutes Intravenous Every 12 hours 03/28/21 0922 03/29/21 1153   03/28/21 0600  vancomycin (VANCOREADY) IVPB 1250 mg/250 mL  Status:  Discontinued        1,250 mg 166.7 mL/hr over 90 Minutes Intravenous Every 12 hours 03/27/21 2118 03/28/21 0922   03/27/21 1800  vancomycin (VANCOREADY) IVPB 2000 mg/400 mL        2,000 mg 200 mL/hr over 120 Minutes Intravenous  Once 03/27/21 1737 03/27/21 2132   03/27/21 1730  vancomycin (VANCOCIN) IVPB 1000 mg/200 mL premix  Status:  Discontinued        1,000 mg 200 mL/hr over 60 Minutes Intravenous  Once 03/27/21 1720 03/27/21 1737   03/27/21 1730  cefTRIAXone (ROCEPHIN) 2 g in sodium chloride 0.9 % 100 mL IVPB        2 g 200 mL/hr over 30 Minutes Intravenous  Once 03/27/21 1720 03/27/21 2132       I have personally reviewed the following labs and images: CBC: Recent Labs  Lab 03/27/21 1805 03/29/21 0210 03/29/21 1310 03/30/21 0341 03/31/21 0328 04/01/21 0310  WBC 7.6 7.1 9.0 9.2 7.8 7.7  NEUTROABS 6.2  --   --   --   --   --   HGB 12.4* 9.6* 9.7* 8.5* 8.9* 9.2*  HCT 38.1* 28.3* 28.5* 26.1* 26.7* 27.5*  MCV 88.6 86.3 86.6 89.1 87.8 87.0  PLT 218 232 254 292 314 376   BMP &GFR Recent Labs  Lab 03/27/21 1805 03/28/21 0655 03/29/21 0210 03/29/21 1310 03/30/21 0341 03/31/21 0328  NA 131*  --  134* 134* 134* 137  K 3.6  --  2.8* 3.1* 3.5 4.1  CL 96*  --  104 107 108 106  CO2 24  --  21* 22 19* 25  GLUCOSE 176*  --  129* 145* 131* 134*  BUN 17  --  13 11 9 6   CREATININE 1.08  --  0.85 0.75 0.81 0.92  CALCIUM 8.7*  --  8.1* 8.3* 8.1* 8.6*  MG  --  1.8  --  1.9 1.8 2.0  PHOS  --   --   --  2.9 3.0 2.8   Estimated Creatinine Clearance: 129.2 mL/min (by C-G formula based on SCr of 0.92 mg/dL). Liver & Pancreas: Recent Labs  Lab 03/27/21 1805  03/29/21 0210 03/29/21 1310 03/30/21 0341 03/31/21 0328  AST 67* 49*  --   --  23  ALT 29 30  --   --  19  ALKPHOS 93 71  --   --  66  BILITOT 0.7 0.3  --   --  0.3  PROT 8.6* 6.1*  --   --  6.5  ALBUMIN 3.1* 2.1* 2.2* 2.0* 2.2*   No results for input(s): LIPASE, AMYLASE in the last 168 hours. No results for input(s): AMMONIA in the last 168 hours. Diabetic: Recent Labs    03/29/21 1310  HGBA1C 6.3*   Recent Labs  Lab 03/31/21 1144 03/31/21 1626 03/31/21 2112 04/01/21 0638 04/01/21 1148  GLUCAP 139* 133* 275* 145* 203*   Cardiac Enzymes: No results for input(s): CKTOTAL, CKMB, CKMBINDEX, TROPONINI in the last 168 hours. No results for input(s): PROBNP in the last 8760 hours. Coagulation Profile: Recent Labs  Lab 03/27/21 1805  INR 1.1   Thyroid Function Tests: No results for input(s): TSH, T4TOTAL, FREET4, T3FREE, THYROIDAB in the last 72 hours. Lipid Profile: Recent Labs    03/30/21 0341  CHOL 84  HDL <10*  LDLCALC NOT CALCULATED  TRIG 182*  CHOLHDL UNABLE TO CALCULATE.   Anemia Panel: No results for input(s): VITAMINB12, FOLATE, FERRITIN, TIBC, IRON, RETICCTPCT in the last 72 hours. Urine analysis:    Component Value Date/Time   COLORURINE STRAW (A) 03/29/2021 [place] 03/29/2021 1705   LABSPEC 1.010 03/29/2021 1705   PHURINE 6.0 03/29/2021 1705   GLUCOSEU NEGATIVE 03/29/2021 1705   HGBUR MODERATE (A) 03/29/2021 1705   BILIRUBINUR NEGATIVE 03/29/2021 [place] 03/29/2021 1705   PROTEINUR NEGATIVE 03/29/2021 1705   UROBILINOGEN 4.0 (H) 10/09/2008 1114   NITRITE NEGATIVE 03/29/2021 1705   LEUKOCYTESUR NEGATIVE 03/29/2021 1705   Sepsis Labs: Invalid input(s): PROCALCITONIN, Imogene  Microbiology: Recent Results (from the past 240 hour(s))  Blood Culture (routine x 2)     Status: Abnormal   Collection Time: 03/27/21  6:05 PM   Specimen: Right Antecubital; Blood  Result Value Ref Range Status   Specimen  Description   Final    RIGHT ANTECUBITAL Performed at [facility], [address] [place], [place] [address]    Special Requests   Final    Blood Culture adequate volume BOTTLES DRAWN AEROBIC AND ANAEROBIC Performed at [facility], [address] [place], [place] [address]    Culture  Setup Time   Final    GRAM POSITIVE COCCI Gram Stain Report Called to,Read Back By and Verified With: RACHEAL TATE,RN @1154  03/28/2021 [facility] AEROBIC BOTTLE ONLY CRITICAL RESULT CALLED TO, READ BACK BY AND VERIFIED WITH: VICTORIA BASS RN @1627  03/28/21 EB ANAEROBIC BOTTLE ONLY GRAM POSITIVE COCCI Gram Stain Report Called to,Read Back By and Verified With: PREVIOUSLY CALLED @1154  03/28/2021 TO RACHEAL TATE,RN    Culture (A)  Final    STAPHYLOCOCCUS HOMINIS THE SIGNIFICANCE OF ISOLATING THIS ORGANISM FROM A SINGLE SET OF BLOOD CULTURES WHEN MULTIPLE SETS ARE DRAWN IS UNCERTAIN. PLEASE NOTIFY THE MICROBIOLOGY DEPARTMENT WITHIN ONE WEEK IF SPECIATION AND SENSITIVITIES ARE REQUIRED. Performed at [facility], [place] [address] [place], [place] [address]    Report Status 03/30/2021 FINAL  Final  Blood Culture ID Panel (Reflexed)     Status: Abnormal   Collection Time: 03/27/21  6:05 PM  Result Value Ref Range Status   Enterococcus faecalis NOT DETECTED NOT DETECTED Final   Enterococcus Faecium NOT DETECTED NOT DETECTED Final   Listeria monocytogenes NOT DETECTED NOT DETECTED Final   Staphylococcus species DETECTED (A) NOT DETECTED Final    Comment: CRITICAL RESULT CALLED TO, READ BACK BY AND VERIFIED WITH: VICTORIA BASS RN @1627  03/28/21 EB    Staphylococcus aureus (BCID) NOT DETECTED NOT DETECTED Final   Staphylococcus epidermidis NOT DETECTED NOT DETECTED Final   Staphylococcus lugdunensis NOT DETECTED NOT DETECTED Final   Streptococcus species NOT DETECTED NOT DETECTED Final   Streptococcus agalactiae NOT DETECTED NOT DETECTED Final   Streptococcus pneumoniae NOT DETECTED NOT DETECTED Final    Streptococcus pyogenes NOT DETECTED NOT DETECTED Final   A.calcoaceticus-baumannii NOT DETECTED NOT DETECTED Final   Bacteroides fragilis NOT DETECTED NOT DETECTED Final   Enterobacterales NOT DETECTED NOT DETECTED Final   Enterobacter cloacae complex NOT DETECTED NOT DETECTED Final   Escherichia coli NOT DETECTED NOT DETECTED Final   Klebsiella aerogenes NOT DETECTED NOT DETECTED Final   Klebsiella oxytoca NOT DETECTED NOT DETECTED Final   Klebsiella pneumoniae NOT DETECTED NOT DETECTED Final   Proteus species NOT DETECTED NOT DETECTED Final   Salmonella species NOT DETECTED NOT DETECTED Final   Serratia marcescens NOT DETECTED NOT DETECTED Final   Haemophilus influenzae NOT DETECTED NOT DETECTED Final   Neisseria meningitidis NOT DETECTED NOT DETECTED Final   Pseudomonas aeruginosa NOT DETECTED NOT DETECTED Final   Stenotrophomonas maltophilia NOT DETECTED NOT DETECTED Final   Candida albicans NOT DETECTED NOT DETECTED Final   Candida auris NOT DETECTED NOT DETECTED Final   Candida glabrata NOT DETECTED NOT DETECTED Final   Candida krusei NOT DETECTED NOT DETECTED Final   Candida parapsilosis NOT DETECTED NOT DETECTED Final   Candida tropicalis NOT DETECTED NOT DETECTED Final   Cryptococcus neoformans/gattii NOT DETECTED NOT DETECTED Final    Comment: Performed at [facility], [address], [place], [place] [address]  Resp Panel by RT-PCR (Flu A&B, Covid) Nasopharyngeal Swab     Status: None   Collection Time: 03/27/21  6:07 PM   Specimen: Nasopharyngeal Swab; Nasopharyngeal(NP) swabs in vial transport medium  Result Value Ref Range Status   SARS Coronavirus 2 by RT PCR NEGATIVE NEGATIVE Final    Comment: (NOTE) SARS-CoV-2 target nucleic acids are NOT DETECTED.  The SARS-CoV-2 RNA is generally detectable in upper respiratory specimens during the acute phase of infection. The lowest concentration of SARS-CoV-2 viral copies this assay can detect is 138 copies/mL. A  negative result does not preclude SARS-Cov-2 infection and should not be used as the sole basis for treatment or other patient management decisions. A negative result may occur with  improper specimen collection/handling, submission of specimen other than nasopharyngeal swab, presence of viral mutation(s) within the areas targeted by this assay, and inadequate number of viral copies(<138 copies/mL). A negative result must be combined with clinical observations, patient history, and epidemiological information. The expected result is Negative.  Fact Sheet for Patients:  EntrepreneurPulse.com.au  Fact Sheet for Healthcare Providers:  IncredibleEmployment.be  This test is no t yet approved or cleared by the [place] FDA and  has been authorized for detection and/or diagnosis of SARS-CoV-2 by FDA under an Emergency Use Authorization (EUA). This EUA will remain  in effect (meaning this test can be used) for  the duration of the COVID-19 declaration under Section 564(b)(1) of the Act, 21 U.S.C.section 360bbb-3(b)(1), unless the authorization is terminated  or revoked sooner.       Influenza A by PCR NEGATIVE NEGATIVE Final   Influenza B by PCR NEGATIVE NEGATIVE Final    Comment: (NOTE) The Xpert Xpress SARS-CoV-2/FLU/RSV plus assay is intended as an aid in the diagnosis of influenza from Nasopharyngeal swab specimens and should not be used as a sole basis for treatment. Nasal washings and aspirates are unacceptable for Xpert Xpress SARS-CoV-2/FLU/RSV testing.  Fact Sheet for Patients: EntrepreneurPulse.com.au  Fact Sheet for Healthcare Providers: IncredibleEmployment.be  This test is not yet approved or cleared by the [place] FDA and has been authorized for detection and/or diagnosis of SARS-CoV-2 by FDA under an Emergency Use Authorization (EUA). This EUA will remain in effect (meaning this test can  be used) for the duration of the COVID-19 declaration under Section 564(b)(1) of the Act, 21 U.S.C. section 360bbb-3(b)(1), unless the authorization is terminated or revoked.  Performed at [facility], [address] [place], [place] [address]   Blood Culture (routine x 2)     Status: None   Collection Time: 03/27/21  6:20 PM   Specimen: BLOOD LEFT HAND  Result Value Ref Range Status   Specimen Description BLOOD LEFT HAND  Final   Special Requests   Final    Blood Culture adequate volume BOTTLES DRAWN AEROBIC AND ANAEROBIC   Culture   Final    NO GROWTH 5 DAYS Performed at [facility], [address] [place], [place] [address]    Report Status 04/01/2021 FINAL  Final  Culture, blood (Routine X 2) w Reflex to ID Panel     Status: None (Preliminary result)   Collection Time: 03/29/21  2:00 AM   Specimen: BLOOD RIGHT HAND  Result Value Ref Range Status   Specimen Description BLOOD RIGHT HAND  Final   Special Requests   Final    BOTTLES DRAWN AEROBIC AND ANAEROBIC Blood Culture adequate volume   Culture   Final    NO GROWTH 2 DAYS Performed at [facility], [place] [address] [place], [place] [address]    Report Status PENDING  Incomplete  Culture, blood (Routine X 2) w Reflex to ID Panel     Status: None (Preliminary result)   Collection Time: 03/29/21  2:08 AM   Specimen: BLOOD LEFT HAND  Result Value Ref Range Status   Specimen Description BLOOD LEFT HAND  Final   Special Requests   Final    BOTTLES DRAWN AEROBIC AND ANAEROBIC Blood Culture adequate volume   Culture   Final    NO GROWTH 2 DAYS Performed at [facility], [place] [address] [place], [place] [address]    Report Status PENDING  Incomplete  Aerobic/Anaerobic Culture w Gram Stain (surgical/deep wound)     Status: None (Preliminary result)   Collection Time: 03/29/21  3:50 PM   Specimen: PATH Soft tissue  Result Value Ref Range Status   Specimen Description TISSUE RIGHT TOE  Final   Special Requests RT  GREAT TOE SWAB  Final   Gram Stain   Final    NO WBC SEEN MODERATE GRAM POSITIVE COCCI RARE GRAM NEGATIVE RODS    Culture   Final    ABUNDANT ESCHERICHIA COLI MODERATE STREPTOCOCCUS AGALACTIAE TESTING AGAINST S. AGALACTIAE NOT ROUTINELY PERFORMED DUE TO PREDICTABILITY OF AMP/PEN/VAN SUSCEPTIBILITY. HOLDING FOR POSSIBLE ANAEROBE Performed at [facility], [address] [place], [place] [address]    Report Status PENDING  Incomplete   Organism ID, Bacteria ESCHERICHIA COLI  Final      Susceptibility   Escherichia coli - MIC*    AMPICILLIN <=2 SENSITIVE Sensitive     CEFAZOLIN <=4 SENSITIVE Sensitive     CEFEPIME <=0.12 SENSITIVE Sensitive     CEFTAZIDIME <=1 SENSITIVE Sensitive     CEFTRIAXONE <=0.25 SENSITIVE Sensitive     CIPROFLOXACIN <=0.25 SENSITIVE Sensitive     GENTAMICIN <=1 SENSITIVE Sensitive     IMIPENEM <=0.25 SENSITIVE Sensitive     TRIMETH/SULFA <=20 SENSITIVE Sensitive     AMPICILLIN/SULBACTAM <=2 SENSITIVE Sensitive     PIP/TAZO <=4 SENSITIVE Sensitive     * ABUNDANT ESCHERICHIA COLI  Aerobic/Anaerobic Culture w Gram Stain (surgical/deep wound)     Status: None (Preliminary result)   Collection Time: 03/29/21  4:04 PM   Specimen: Soft Tissue, Other  Result Value Ref Range Status   Specimen Description TISSUE  Final   Special Requests RT 1ST METATARSAL  Final   Gram Stain NO WBC SEEN NO ORGANISMS SEEN   Final   Culture   Final    NO GROWTH 3 DAYS NO ANAEROBES ISOLATED; CULTURE IN PROGRESS FOR 5 DAYS Performed at [facility], [address], [place], [place] [address]    Report Status PENDING  Incomplete  Urine culture     Status: None   Collection Time: 03/29/21  6:09 PM   Specimen: Urine, Clean Catch  Result Value Ref Range Status   Specimen Description URINE, CLEAN CATCH  Final   Special Requests NONE  Final   Culture   Final    NO GROWTH Performed at [facility], [place] [address] [place], [place] [address]    Report Status  03/31/2021 FINAL  Final  Surgical pcr screen     Status: Abnormal   Collection Time: 03/31/21  2:54 PM   Specimen: Nasal Mucosa; Nasal Swab  Result Value Ref Range Status   MRSA, PCR NEGATIVE NEGATIVE Final   Staphylococcus aureus POSITIVE (A) NEGATIVE Final    Comment: (NOTE) The Xpert SA Assay (FDA approved for NASAL specimens in patients 86 years of age and older), is one component of a comprehensive surveillance program. It is not intended to diagnose infection nor to guide or monitor treatment. Performed at [facility], [place] [address], [place], [place] [address]     Radiology Studies: [place] EKG SITE RITE  Result Date: 04/01/2021 If [facility] image not attached, placement could not be confirmed due to current cardiac rhythm.     Kaneshia Cater T. Welaka  If 7PM-7AM, please contact night-coverage [email or web address] 04/01/2021, 12:49 PM

## 2021-04-01 NOTE — Interval H&P Note (Signed)
History and Physical Interval Note:  04/01/2021 4:24 PM  Eugene Diaz  has presented today for surgery, with the diagnosis of Osteomyelitis, ulcer right foot.  The various methods of treatment have been discussed with the patient and family. After consideration of risks, benefits and other options for treatment, the patient has consented to  Procedure(s): Right foot wound debridement and irrigation, possible closure, possible first metatarsal resection (Right) as a surgical intervention.  The patient's history has been reviewed, patient examined, no change in status, stable for surgery.  I have reviewed the patient's chart and labs.  Questions were answered to the patient's satisfaction.     Evelina Bucy

## 2021-04-01 NOTE — Transfer of Care (Signed)
Immediate Anesthesia Transfer of Care Note  Patient: Eugene Diaz  Procedure(s) Performed: Right foot wound debridement and irrigation and closure, bone biopsy (Right Foot)  Patient Location: PACU  Anesthesia Type:General  Level of Consciousness: drowsy and patient cooperative  Airway & Oxygen Therapy: Patient Spontanous Breathing and Patient connected to nasal cannula oxygen  Post-op Assessment: Report given to RN, Post -op Vital signs reviewed and stable and Patient moving all extremities  Post vital signs: Reviewed and stable  Last Vitals:  Vitals Value Taken Time  BP 162/82 04/01/21 1726  Temp    Pulse 72 04/01/21 1726  Resp 12 04/01/21 1726  SpO2 100 % 04/01/21 1726  Vitals shown include unvalidated device data.  Last Pain:  Vitals:   04/01/21 0525  TempSrc: Oral  PainSc:       Patients Stated Pain Goal: 0 (91/02/89 0228)  Complications: No complications documented.

## 2021-04-01 NOTE — Op Note (Addendum)
  Patient Name: Eugene Diaz DOB: [birth year]  MRN: [id number]   Date of Surgery: 04/01/21  Surgeon: Dr. Hardie Pulley, DPM Assistants: None  Pre-operative Diagnosis:  Diabetic ulcer right foot with osteomyelitis Post-operative Diagnosis:  * No Diagnosis Codes entered * Procedures:  1) Right foot wound debridement and closure  2) Bone biopsy Pathology/Specimens: ID Type Source Tests Collected by Time Destination  A : right foot first metatarsal bone Tissue Soft Tissue, Other FUNGUS CULTURE WITH STAIN, AEROBIC/ANAEROBIC CULTURE W GRAM STAIN (SURGICAL/DEEP WOUND), FUNGUS STAIN, ACID FAST CULTURE WITH REFLEXED SENSITIVITIES (MYCOBACTERIA), ACID FAST SMEAR (AFB, MYCOBACTERIA) Evelina Bucy, DPM 04/01/2021 1652    Anesthesia: MAC Hemostasis: * No tourniquets in log * Estimated Blood Loss: 10 ml Materials: * No implants in log * Medications: 1g vancomycin powder, 10 ccs 0.5% marcaine plain Complications: none  Indications for Procedure:  This is a 55 y.o. 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 He presents today for delayed closure of the wound   Procedure in Detail: Patient was identified in pre-operative holding area. Formal consent was signed and the right lower extremity was marked. Patient was brought back to the operating room. Anesthesia was induced. The extremity was prepped and draped in the usual sterile fashion. Timeout was taken to confirm patient name, laterality, and procedure prior to incision.   Attention was then directed to the right foot. There was a wound measuring 8*4*4.5. The wound was irrigated with 1L of normal saline via pulse lavage. The wound was then debrided of non-viable soft tissue with a rongeur to bleeding viable wound edges. A bone biopsy of the 1st metatarsal was taken with a rongeur. The bone was then rasped smoth. The wound was irrigated for another 1L of NS.  The skin edges were then undermined to  create flaps for closure. The flaps were sutured with 3-0 monocryl to cover the bone. The wound was then further closed in layers with 3-0 monocryl and 3-0 nylon  The foot was then dressed with xeroform, 4x4, kerlix, and ACE bandage. Patient tolerated the procedure well.  Disposition: Following a period of post-operative monitoring, patient will be transferred to the floor. He will be ok for D/c tomorrow. I do hope that this will heal however given the extent of ST loss the skin edges are under tension and he may have delayed healing as a result of this. He may benefit from conversion to TMA should the wound fail to heal.

## 2021-04-01 NOTE — Anesthesia Postprocedure Evaluation (Signed)
Anesthesia Post Note  Patient: Eugene Diaz  Procedure(s) Performed: Right foot wound debridement and irrigation and closure, bone biopsy (Right Foot)     Patient location during evaluation: PACU Anesthesia Type: General Level of consciousness: awake and alert Pain management: pain level controlled Vital Signs Assessment: post-procedure vital signs reviewed and stable Respiratory status: spontaneous breathing, nonlabored ventilation, respiratory function stable and patient connected to nasal cannula oxygen Cardiovascular status: blood pressure returned to baseline and stable Postop Assessment: no apparent nausea or vomiting Anesthetic complications: no   No complications documented.  Last Vitals:  Vitals:   04/01/21 1811 04/01/21 1845  BP: (!) 149/79 (!) 153/73  Pulse: 65 61  Resp: 19 17  Temp: 36.8 C 36.8 C  SpO2: 99% 99%    Last Pain:  Vitals:   04/01/21 1931  TempSrc:   PainSc: 0-No pain                 Catalina Gravel

## 2021-04-01 NOTE — Anesthesia Procedure Notes (Signed)
Procedure Name: Intubation Date/Time: 04/01/2021 4:40 PM Performed by: Moshe Salisbury, CRNA Pre-anesthesia Checklist: Patient identified, Emergency Drugs available, Suction available and Patient being monitored Patient Re-evaluated:Patient Re-evaluated prior to induction Oxygen Delivery Method: Circle System Utilized Preoxygenation: Pre-oxygenation with 100% oxygen Induction Type: IV induction Ventilation: Mask ventilation without difficulty Laryngoscope Size: Mac and 4 Grade View: Grade II Tube type: Oral Tube size: 8.0 mm Number of attempts: 1 Airway Equipment and Method: Stylet Placement Confirmation: ETT inserted through vocal cords under direct vision,  positive ETCO2 and breath sounds checked- equal and bilateral Secured at: 22 cm Tube secured with: Tape Dental Injury: Teeth and Oropharynx as per pre-operative assessment

## 2021-04-01 NOTE — Anesthesia Preprocedure Evaluation (Addendum)
Anesthesia Evaluation  Patient identified by MRN, date of birth, ID band Patient awake    Reviewed: Allergy & Precautions, NPO status , Patient's Chart, lab work & pertinent test results  Airway Mallampati: II  TM Distance: >3 FB Neck ROM: Full    Dental  (+) Dental Advisory Given, Chipped, Poor Dentition, Missing   Pulmonary asthma , former smoker,    Pulmonary exam normal breath sounds clear to auscultation       Cardiovascular hypertension (poorly controlled), Pt. on medications + Peripheral Vascular Disease and +CHF (grade 2 diastolic dysfunction)  Normal cardiovascular exam Rhythm:Regular Rate:Normal  Echo 2015: Somewhat limited views. Mild LVH with LVEF 55-60%, grade 2  diastolic dysfunction. Mild MAC with trivial mtiral  regurgitation. Mildly sclerotic aortic valve. Mildly  reduced RV contraction. Unable to assess PASP. No  pericardial effusion.    Normal stress test 2015   Neuro/Psych PSYCHIATRIC DISORDERS Anxiety Depression  Neuromuscular disease (alcoholic peripheral neuropathy )    GI/Hepatic PUD, GERD  ,(+)     substance abuse (EtOH abuse)  alcohol use,   Endo/Other  diabetes, Well Controlled, Type 2, Oral Hypoglycemic AgentsObesity BMI 37 a1c 6.3  Renal/GU negative Renal ROS  negative genitourinary   Musculoskeletal  (+) Arthritis , Osteoarthritis,  Right foot osteo, ulcer   Abdominal   Peds  Hematology  (+) Blood dyscrasia, anemia , hct 27.5   Anesthesia Other Findings   Reproductive/Obstetrics negative OB ROS                                                             Anesthesia Evaluation  Patient identified by MRN, date of birth, ID band Patient awake    Reviewed: Allergy & Precautions, NPO status , Patient's Chart, lab work & pertinent test results  Airway Mallampati: II  TM Distance: >3 FB Neck ROM: Full    Dental  (+) Dental Advisory Given,  Missing, Chipped, Poor Dentition   Pulmonary asthma , former smoker,    Pulmonary exam normal breath sounds clear to auscultation       Cardiovascular hypertension, Pt. on medications + Peripheral Vascular Disease  Normal cardiovascular exam Rhythm:Regular Rate:Normal     Neuro/Psych PSYCHIATRIC DISORDERS Anxiety Depression Essential tremor  Neuromuscular disease    GI/Hepatic Neg liver ROS, PUD, GERD  Medicated,  Endo/Other  diabetes, Type 2, Oral Hypoglycemic AgentsObesity   Renal/GU negative Renal ROS     Musculoskeletal  (+) Arthritis , osteomyelitis and septic joint   Abdominal   Peds  Hematology  (+) Blood dyscrasia, anemia ,   Anesthesia Other Findings   Reproductive/Obstetrics                            Anesthesia Physical Anesthesia Plan  ASA: III and emergent  Anesthesia Plan: General   Post-op Pain Management:    Induction: Intravenous  PONV Risk Score and Plan: 2 and Midazolam, Dexamethasone and Ondansetron  Airway Management Planned: Oral ETT  Additional Equipment:   Intra-op Plan:   Post-operative Plan: Extubation in OR  Informed Consent: I have reviewed the patients History and Physical, chart, labs and discussed the procedure including the risks, benefits and alternatives for the proposed anesthesia with the patient or authorized representative who has indicated his/her understanding  and acceptance.     Dental advisory given  Plan Discussed with: CRNA  Anesthesia Plan Comments:        Anesthesia Quick Evaluation  Anesthesia Physical Anesthesia Plan  ASA: III  Anesthesia Plan: MAC   Post-op Pain Management:    Induction:   PONV Risk Score and Plan: 1 and TIVA, Propofol infusion and Treatment may vary due to age or medical condition  Airway Management Planned: Natural Airway and Simple Face Mask  Additional Equipment: None  Intra-op Plan:   Post-operative Plan:   Informed  Consent: I have reviewed the patients History and Physical, chart, labs and discussed the procedure including the risks, benefits and alternatives for the proposed anesthesia with the patient or authorized representative who has indicated his/her understanding and acceptance.     Dental advisory given  Plan Discussed with: CRNA  Anesthesia Plan Comments:        Anesthesia Quick Evaluation                                  Anesthesia Evaluation  Patient identified by MRN, date of birth, ID band Patient awake    Reviewed: Allergy & Precautions, NPO status , Patient's Chart, lab work & pertinent test results  Airway Mallampati: II  TM Distance: >3 FB Neck ROM: Full    Dental  (+) Dental Advisory Given, Missing, Chipped, Poor Dentition   Pulmonary asthma , former smoker,    Pulmonary exam normal breath sounds clear to auscultation       Cardiovascular hypertension, Pt. on medications + Peripheral Vascular Disease  Normal cardiovascular exam Rhythm:Regular Rate:Normal     Neuro/Psych PSYCHIATRIC DISORDERS Anxiety Depression Essential tremor  Neuromuscular disease    GI/Hepatic Neg liver ROS, PUD, GERD  Medicated,  Endo/Other  diabetes, Type 2, Oral Hypoglycemic AgentsObesity   Renal/GU negative Renal ROS     Musculoskeletal  (+) Arthritis , osteomyelitis and septic joint   Abdominal   Peds  Hematology  (+) Blood dyscrasia, anemia ,   Anesthesia Other Findings   Reproductive/Obstetrics                            Anesthesia Physical Anesthesia Plan  ASA: III and emergent  Anesthesia Plan: General   Post-op Pain Management:    Induction: Intravenous  PONV Risk Score and Plan: 2 and Midazolam, Dexamethasone and Ondansetron  Airway Management Planned: Oral ETT  Additional Equipment:   Intra-op Plan:   Post-operative Plan: Extubation in OR  Informed Consent: I have reviewed the patients History and Physical,  chart, labs and discussed the procedure including the risks, benefits and alternatives for the proposed anesthesia with the patient or authorized representative who has indicated his/her understanding and acceptance.     Dental advisory given  Plan Discussed with: CRNA  Anesthesia Plan Comments:        Anesthesia Quick Evaluation

## 2021-04-01 NOTE — Progress Notes (Addendum)
Subjective: No new complaints   Antibiotics:  Anti-infectives (From admission, onward)   Start     Dose/Rate Route Frequency Ordered Stop   03/31/21 2200  metroNIDAZOLE (FLAGYL) tablet 500 mg        500 mg Oral Every 12 hours 03/31/21 1324     03/31/21 2000  ceFAZolin (ANCEF) IVPB 2g/100 mL premix        2 g 200 mL/hr over 30 Minutes Intravenous Every 8 hours 03/31/21 1319     03/29/21 2200  vancomycin (VANCOREADY) IVPB 1250 mg/250 mL  Status:  Discontinued        1,250 mg 166.7 mL/hr over 90 Minutes Intravenous Every 12 hours 03/29/21 1939 03/30/21 1615   03/29/21 1513  vancomycin (VANCOCIN) powder  Status:  Discontinued          As needed 03/29/21 1514 03/29/21 1624   03/29/21 1145  ceFEPIme (MAXIPIME) 2 g in sodium chloride 0.9 % 100 mL IVPB  Status:  Discontinued        2 g 200 mL/hr over 30 Minutes Intravenous Every 8 hours 03/29/21 1050 03/31/21 1225   03/29/21 1130  metroNIDAZOLE (FLAGYL) IVPB 500 mg  Status:  Discontinued        500 mg 100 mL/hr over 60 Minutes Intravenous Every 8 hours 03/29/21 1035 03/31/21 1324   03/29/21 1101  vancomycin variable dose per unstable renal function (pharmacist dosing)  Status:  Discontinued         Does not apply See admin instructions 03/29/21 1105 03/29/21 1941   03/28/21 1900  cefTRIAXone (ROCEPHIN) 2 g in sodium chloride 0.9 % 100 mL IVPB  Status:  Discontinued        2 g 200 mL/hr over 30 Minutes Intravenous Every 24 hours 03/27/21 2129 03/29/21 1035   03/28/21 1800  vancomycin (VANCOREADY) IVPB 2000 mg/400 mL        2,000 mg 200 mL/hr over 120 Minutes Intravenous Every 12 hours 03/28/21 0922 03/29/21 1153   03/28/21 0600  vancomycin (VANCOREADY) IVPB 1250 mg/250 mL  Status:  Discontinued        1,250 mg 166.7 mL/hr over 90 Minutes Intravenous Every 12 hours 03/27/21 2118 03/28/21 0922   03/27/21 1800  vancomycin (VANCOREADY) IVPB 2000 mg/400 mL        2,000 mg 200 mL/hr over 120 Minutes Intravenous  Once 03/27/21 1737  03/27/21 2132   03/27/21 1730  vancomycin (VANCOCIN) IVPB 1000 mg/200 mL premix  Status:  Discontinued        1,000 mg 200 mL/hr over 60 Minutes Intravenous  Once 03/27/21 1720 03/27/21 1737   03/27/21 1730  cefTRIAXone (ROCEPHIN) 2 g in sodium chloride 0.9 % 100 mL IVPB        2 g 200 mL/hr over 30 Minutes Intravenous  Once 03/27/21 1720 03/27/21 2132      Medications: Scheduled Meds: . amLODipine  10 mg Oral Daily  . aspirin EC  81 mg Oral Daily  . atorvastatin  40 mg Oral QPM  . Chlorhexidine Gluconate Cloth  6 each Topical Q0600  . folic acid  1 mg Oral Daily  . heparin  5,000 Units Subcutaneous Q8H  . hydrALAZINE  100 mg Oral Q8H  . hydrochlorothiazide  25 mg Oral Daily  . insulin aspart  0-15 Units Subcutaneous TID WC  . insulin aspart  0-5 Units Subcutaneous QHS  . insulin aspart  4 Units Subcutaneous TID WC  . lisinopril  40 mg  Oral QHS  . metroNIDAZOLE  500 mg Oral Q12H  . multivitamin with minerals  1 tablet Oral Daily  . mupirocin ointment  1 application Nasal BID  . thiamine  100 mg Oral Daily   Continuous Infusions: . sodium chloride 500 mL (04/01/21 0529)  .  ceFAZolin (ANCEF) IV 2 g (04/01/21 0530)   PRN Meds:.sodium chloride, acetaminophen **OR** acetaminophen, ALPRAZolam, hydrALAZINE, ondansetron **OR** ondansetron (ZOFRAN) IV, oxyCODONE, polyethylene glycol, traZODone    Objective: Weight change:   Intake/Output Summary (Last 24 hours) at 04/01/2021 1228 Last data filed at 04/01/2021 1100 Gross per 24 hour  Intake 678.18 ml  Output 2051 ml  Net -1372.82 ml   Blood pressure (!) 171/75, pulse 90, temperature 98.2 F (36.8 C), resp. rate 17, height 6\' 2"  (1.88 m), weight 131.5 kg, SpO2 98 %. Temp:  [98 F (36.7 C)-98.4 F (36.9 C)] 98.2 F (36.8 C) (06/02 0903) Pulse Rate:  [72-90] 90 (06/02 0903) Resp:  [16-18] 17 (06/02 0903) BP: (141-173)/(75-87) 171/75 (06/02 0903) SpO2:  [96 %-98 %] 98 % (06/02 0903)  Physical Exam: Physical  Exam Constitutional:      Appearance: He is well-developed.  HENT:     Head: Normocephalic and atraumatic.  Eyes:     Conjunctiva/sclera: Conjunctivae normal.  Cardiovascular:     Rate and Rhythm: Normal rate and regular rhythm.  Pulmonary:     Effort: Pulmonary effort is normal. No respiratory distress.     Breath sounds: No wheezing.  Abdominal:     General: There is no distension.     Palpations: Abdomen is soft.  Musculoskeletal:     Cervical back: Normal range of motion and neck supple.  Skin:    General: Skin is warm and dry.     Findings: No erythema or rash.  Neurological:     General: No focal deficit present.     Mental Status: He is alert and oriented to person, place, and time.  Psychiatric:        Mood and Affect: Mood normal.        Behavior: Behavior normal.        Thought Content: Thought content normal.        Judgment: Judgment normal.   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 C-Reactive Protein No results for input(s): CRP in the last 72 hours.  Micro Results: Recent Results (from the past 720 hour(s))  Blood Culture (routine x 2)     Status: Abnormal   Collection Time: 03/27/21  6:05 PM   Specimen: Right Antecubital; Blood  Result Value Ref Range Status   Specimen Description   Final    RIGHT ANTECUBITAL Performed at [facility], [address] [place], [place] [address]    Special Requests   Final  Blood Culture adequate volume BOTTLES DRAWN AEROBIC AND ANAEROBIC Performed  at [facility], [address], [place], [place] [address]    Culture  Setup Time   Final    GRAM POSITIVE COCCI Gram Stain Report Called to,Read Back By and Verified With: RACHEAL TATE,RN @1154  03/28/2021 KAY AEROBIC BOTTLE ONLY CRITICAL RESULT CALLED TO, READ BACK BY AND VERIFIED WITH: VICTORIA BASS RN @1627  03/28/21 EB ANAEROBIC BOTTLE ONLY GRAM POSITIVE COCCI Gram Stain Report Called to,Read Back By and Verified With: PREVIOUSLY CALLED @1154  03/28/2021 TO RACHEAL TATE,RN    Culture (A)  Final    STAPHYLOCOCCUS HOMINIS THE SIGNIFICANCE OF ISOLATING THIS ORGANISM FROM A SINGLE SET OF BLOOD CULTURES WHEN MULTIPLE SETS ARE DRAWN IS UNCERTAIN. PLEASE NOTIFY THE MICROBIOLOGY DEPARTMENT WITHIN ONE WEEK IF SPECIATION AND SENSITIVITIES ARE REQUIRED. Performed at [facility], [place] [address], [place], [place] [address]    Report Status 03/30/2021 FINAL  Final  Blood Culture ID Panel (Reflexed)     Status: Abnormal   Collection Time: 03/27/21  6:05 PM  Result Value Ref Range Status   Enterococcus faecalis NOT DETECTED NOT DETECTED Final   Enterococcus Faecium NOT DETECTED NOT DETECTED Final   Listeria monocytogenes NOT DETECTED NOT DETECTED Final   Staphylococcus species DETECTED (A) NOT DETECTED Final    Comment: CRITICAL RESULT CALLED TO, READ BACK BY AND VERIFIED WITH: VICTORIA BASS RN @1627  03/28/21 EB    Staphylococcus aureus (BCID) NOT DETECTED NOT DETECTED Final   Staphylococcus epidermidis NOT DETECTED NOT DETECTED Final   Staphylococcus lugdunensis NOT DETECTED NOT DETECTED Final   Streptococcus species NOT DETECTED NOT DETECTED Final   Streptococcus agalactiae NOT DETECTED NOT DETECTED Final   Streptococcus pneumoniae NOT DETECTED NOT DETECTED Final   Streptococcus pyogenes NOT DETECTED NOT DETECTED Final   A.calcoaceticus-baumannii NOT DETECTED NOT DETECTED Final   Bacteroides fragilis NOT DETECTED NOT DETECTED Final   Enterobacterales NOT DETECTED NOT DETECTED Final    Enterobacter cloacae complex NOT DETECTED NOT DETECTED Final   Escherichia coli NOT DETECTED NOT DETECTED Final   Klebsiella aerogenes NOT DETECTED NOT DETECTED Final   Klebsiella oxytoca NOT DETECTED NOT DETECTED Final   Klebsiella pneumoniae NOT DETECTED NOT DETECTED Final   Proteus species NOT DETECTED NOT DETECTED Final   Salmonella species NOT DETECTED NOT DETECTED Final   Serratia marcescens NOT DETECTED NOT DETECTED Final   Haemophilus influenzae NOT DETECTED NOT DETECTED Final   Neisseria meningitidis NOT DETECTED NOT DETECTED Final   Pseudomonas aeruginosa NOT DETECTED NOT DETECTED Final   Stenotrophomonas maltophilia NOT DETECTED NOT DETECTED Final   Candida albicans NOT DETECTED NOT DETECTED Final   Candida auris NOT DETECTED NOT DETECTED Final   Candida glabrata NOT DETECTED NOT DETECTED Final   Candida krusei NOT DETECTED NOT DETECTED Final   Candida parapsilosis NOT DETECTED NOT DETECTED Final   Candida tropicalis NOT DETECTED NOT DETECTED Final   Cryptococcus neoformans/gattii NOT DETECTED NOT DETECTED Final    Comment: Performed at [facility], [address], [place], [place] [address]  Resp Panel by RT-PCR (Flu A&B, Covid) Nasopharyngeal Swab     Status: None   Collection Time: 03/27/21  6:07 PM   Specimen: Nasopharyngeal Swab; Nasopharyngeal(NP) swabs in vial transport medium  Result Value Ref Range Status   SARS Coronavirus 2 by RT PCR NEGATIVE NEGATIVE Final    Comment: (NOTE) SARS-CoV-2 target nucleic acids are NOT DETECTED.  The SARS-CoV-2 RNA is generally detectable in upper respiratory specimens during  the acute phase of infection. The lowest concentration of SARS-CoV-2 viral copies this assay can detect is 138 copies/mL. A negative result does not preclude SARS-Cov-2 infection and should not be used as the sole basis for treatment or other patient management decisions. A negative result may occur with  improper specimen collection/handling,  submission of specimen other than nasopharyngeal swab, presence of viral mutation(s) within the areas targeted by this assay, and inadequate number of viral copies(<138 copies/mL). A negative result must be combined with clinical observations, patient history, and epidemiological information. The expected result is Negative.  Fact Sheet for Patients:  EntrepreneurPulse.com.au  Fact Sheet for Healthcare Providers:  IncredibleEmployment.be  This test is no t yet approved or cleared by the [place] FDA and  has been authorized for detection and/or diagnosis of SARS-CoV-2 by FDA under an Emergency Use Authorization (EUA). This EUA will remain  in effect (meaning this test can be used) for the duration of the COVID-19 declaration under Section 564(b)(1) of the Act, 21 U.S.C.section 360bbb-3(b)(1), unless the authorization is terminated  or revoked sooner.       Influenza A by PCR NEGATIVE NEGATIVE Final   Influenza B by PCR NEGATIVE NEGATIVE Final    Comment: (NOTE) The Xpert Xpress SARS-CoV-2/FLU/RSV plus assay is intended as an aid in the diagnosis of influenza from Nasopharyngeal swab specimens and should not be used as a sole basis for treatment. Nasal washings and aspirates are unacceptable for Xpert Xpress SARS-CoV-2/FLU/RSV testing.  Fact Sheet for Patients: EntrepreneurPulse.com.au  Fact Sheet for Healthcare Providers: IncredibleEmployment.be  This test is not yet approved or cleared by the [place] FDA and has been authorized for detection and/or diagnosis of SARS-CoV-2 by FDA under an Emergency Use Authorization (EUA). This EUA will remain in effect (meaning this test can be used) for the duration of the COVID-19 declaration under Section 564(b)(1) of the Act, 21 U.S.C. section 360bbb-3(b)(1), unless the authorization is terminated or revoked.  Performed at [facility], [address], [place], [place] [address]   Blood Culture (routine x 2)     Status: None   Collection Time: 03/27/21  6:20 PM   Specimen: BLOOD LEFT HAND  Result Value Ref Range Status   Specimen Description BLOOD LEFT HAND  Final   Special Requests   Final    Blood Culture adequate volume BOTTLES DRAWN AEROBIC AND ANAEROBIC   Culture   Final    NO GROWTH 5 DAYS Performed at [facility], [address], [place], [place] [address]    Report Status 04/01/2021 FINAL  Final  Culture, blood (Routine X 2) w Reflex to ID Panel     Status: None (Preliminary result)   Collection Time: 03/29/21  2:00 AM   Specimen: BLOOD RIGHT HAND  Result Value Ref Range Status   Specimen Description BLOOD RIGHT HAND  Final   Special Requests   Final    BOTTLES DRAWN AEROBIC AND ANAEROBIC Blood Culture adequate volume   Culture   Final    NO GROWTH 2 DAYS Performed at [facility], [place] [address], [place], [place] [address]    Report Status PENDING  Incomplete  Culture, blood (Routine X 2) w Reflex to ID Panel     Status: None (Preliminary result)   Collection Time: 03/29/21  2:08 AM   Specimen: BLOOD LEFT HAND  Result Value Ref Range Status   Specimen Description BLOOD LEFT HAND  Final   Special Requests   Final  BOTTLES DRAWN AEROBIC AND ANAEROBIC Blood Culture adequate volume   Culture   Final    NO GROWTH 2 DAYS Performed at [facility], [place] [address] [place], [place] [address]    Report Status PENDING  Incomplete  Aerobic/Anaerobic Culture w Gram Stain (surgical/deep wound)     Status: None (Preliminary result)   Collection Time: 03/29/21  3:50 PM   Specimen: PATH Soft tissue  Result Value Ref Range Status   Specimen Description TISSUE RIGHT TOE  Final   Special Requests RT GREAT TOE SWAB  Final   Gram Stain   Final    NO WBC SEEN MODERATE GRAM POSITIVE COCCI RARE GRAM NEGATIVE RODS    Culture   Final    ABUNDANT ESCHERICHIA COLI MODERATE STREPTOCOCCUS AGALACTIAE TESTING AGAINST S.  AGALACTIAE NOT ROUTINELY PERFORMED DUE TO PREDICTABILITY OF AMP/PEN/VAN SUSCEPTIBILITY. HOLDING FOR POSSIBLE ANAEROBE Performed at [facility], [place] [address], [place], [place] [address]    Report Status PENDING  Incomplete   Organism ID, Bacteria ESCHERICHIA COLI  Final      Susceptibility   Escherichia coli - MIC*    AMPICILLIN <=2 SENSITIVE Sensitive     CEFAZOLIN <=4 SENSITIVE Sensitive     CEFEPIME <=0.12 SENSITIVE Sensitive     CEFTAZIDIME <=1 SENSITIVE Sensitive     CEFTRIAXONE <=0.25 SENSITIVE Sensitive     CIPROFLOXACIN <=0.25 SENSITIVE Sensitive     GENTAMICIN <=1 SENSITIVE Sensitive     IMIPENEM <=0.25 SENSITIVE Sensitive     TRIMETH/SULFA <=20 SENSITIVE Sensitive     AMPICILLIN/SULBACTAM <=2 SENSITIVE Sensitive     PIP/TAZO <=4 SENSITIVE Sensitive     * ABUNDANT ESCHERICHIA COLI  Aerobic/Anaerobic Culture w Gram Stain (surgical/deep wound)     Status: None (Preliminary result)   Collection Time: 03/29/21  4:04 PM   Specimen: Soft Tissue, Other  Result Value Ref Range Status   Specimen Description TISSUE  Final   Special Requests RT 1ST METATARSAL  Final   Gram Stain NO WBC SEEN NO ORGANISMS SEEN   Final   Culture   Final    NO GROWTH 3 DAYS NO ANAEROBES ISOLATED; CULTURE IN PROGRESS FOR 5 DAYS Performed at [facility], [address] [place], [place] [address]    Report Status PENDING  Incomplete  Urine culture     Status: None   Collection Time: 03/29/21  6:09 PM   Specimen: Urine, Clean Catch  Result Value Ref Range Status   Specimen Description URINE, CLEAN CATCH  Final   Special Requests NONE  Final   Culture   Final    NO GROWTH Performed at [facility], [place] [address], [place], [place] [address]    Report Status 03/31/2021 FINAL  Final  Surgical pcr screen     Status: Abnormal   Collection Time: 03/31/21  2:54 PM   Specimen: Nasal Mucosa; Nasal Swab  Result Value Ref Range Status   MRSA, PCR NEGATIVE NEGATIVE Final    Staphylococcus aureus POSITIVE (A) NEGATIVE Final    Comment: (NOTE) The Xpert SA Assay (FDA approved for NASAL specimens in patients 51 years of age and older), is one component of a comprehensive surveillance program. It is not intended to diagnose infection nor to guide or monitor treatment. Performed at [facility], [place] [address], [place],  [address]     Studies/Results: [place] EKG SITE RITE  Result Date: 04/01/2021 If [facility] image not attached, placement could  not be confirmed due to current cardiac rhythm.     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 male with extensive smoking history, alcohol abuse peripheral neuropathy, diabetes who is undergone left above-the-knee amputation, now with osteomyelitis involving his first metatarsal phalangeal tarsal head with an abscess along the flexor houses brevis muscle with gangrenous changes, status post surgery yesterday with I&D of foot abscess and partial first ray resection.  Polymicrobial osteomyelitis:  Narrowed to cefazolin and metronidazole  If he can be responsible with IV antibiotics at home we can discharge him on cefazolin plus or minus the metronidazole (concern about if he starts drinking again and gets very sick from this  Going back to the operating room today   Alcoholism with alcohol induced neuropathy: He was drinking nearly a gallon of hard liquor per day.  He continues to maintain that he will stay sober  is quit and is confident that he can stay sober after being discharged in the hospital. We are going to use BID  metronidazole dose just in case he does relpase  I will treat him for 6 weeks with followup with [place].   I spent more than 35 minutes with the patient including greater than 50% of time in face to face counseling of the patient personally reviewing radiographs, long with pertinent laboratory microbiological data review of medical records and in coordination of his care.    DERRYL UHER has an appointment on 04/14/2021 at 1045 AM with [name] at  [facility] Infectious Disease is located in the [facility] at  [place] in [place].  [address], which is located to the left of the elevators.  Phone: [phone]  Fax: [phone]  [email or web address]  He should arrive 15-30 minutes prior to his appointment.   LOS: 5 days   Alcide Evener 04/01/2021, 12:28 PM

## 2021-04-02 ENCOUNTER — Encounter (HOSPITAL_COMMUNITY): Payer: Self-pay | Admitting: Podiatry

## 2021-04-02 DIAGNOSIS — G621 Alcoholic polyneuropathy: Secondary | ICD-10-CM | POA: Diagnosis not present

## 2021-04-02 DIAGNOSIS — E11621 Type 2 diabetes mellitus with foot ulcer: Secondary | ICD-10-CM | POA: Diagnosis not present

## 2021-04-02 DIAGNOSIS — E1141 Type 2 diabetes mellitus with diabetic mononeuropathy: Secondary | ICD-10-CM | POA: Diagnosis not present

## 2021-04-02 DIAGNOSIS — E1152 Type 2 diabetes mellitus with diabetic peripheral angiopathy with gangrene: Secondary | ICD-10-CM | POA: Diagnosis not present

## 2021-04-02 DIAGNOSIS — E1169 Type 2 diabetes mellitus with other specified complication: Secondary | ICD-10-CM | POA: Diagnosis not present

## 2021-04-02 LAB — RENAL FUNCTION PANEL
Albumin: 2.4 g/dL — ABNORMAL LOW (ref 3.5–5.0)
Anion gap: 5 (ref 5–15)
BUN: 6 mg/dL (ref 6–20)
CO2: 26 mmol/L (ref 22–32)
Calcium: 8.7 mg/dL — ABNORMAL LOW (ref 8.9–10.3)
Chloride: 105 mmol/L (ref 98–111)
Creatinine, Ser: 0.8 mg/dL (ref 0.61–1.24)
GFR, Estimated: >60 mL/min (ref >60)
Glucose, Bld: 143 mg/dL — ABNORMAL HIGH (ref 70–99)
Phosphorus: 3.2 mg/dL (ref 2.5–4.6)
Potassium: 3.5 mmol/L (ref 3.5–5.1)
Sodium: 136 mmol/L (ref 135–145)

## 2021-04-02 LAB — CBC
HCT: 26.5 % — ABNORMAL LOW (ref 39.0–52.0)
Hemoglobin: 8.9 g/dL — ABNORMAL LOW (ref 13.0–17.0)
MCH: 28.9 pg (ref 26.0–34.0)
MCHC: 33.6 g/dL (ref 30.0–36.0)
MCV: 86 fL (ref 80.0–100.0)
Platelets: 350 10*3/uL (ref 150–400)
RBC: 3.08 MIL/uL — ABNORMAL LOW (ref 4.22–5.81)
RDW: 12.9 % (ref 11.5–15.5)
WBC: 7.4 10*3/uL (ref 4.0–10.5)
nRBC: 0 % (ref 0.0–0.2)

## 2021-04-02 LAB — AEROBIC/ANAEROBIC CULTURE W GRAM STAIN (SURGICAL/DEEP WOUND): Gram Stain: NONE SEEN

## 2021-04-02 LAB — MAGNESIUM: Magnesium: 1.9 mg/dL (ref 1.7–2.4)

## 2021-04-02 LAB — GLUCOSE, CAPILLARY
Glucose-Capillary: 134 mg/dL — ABNORMAL HIGH (ref 70–99)
Glucose-Capillary: 164 mg/dL — ABNORMAL HIGH (ref 70–99)

## 2021-04-02 MED ORDER — GLIPIZIDE 10 MG PO TABS: 10.0000 mg | ORAL_TABLET | Freq: Two times a day (BID) | ORAL | 11 refills | Status: DC

## 2021-04-02 MED ORDER — HEPARIN SOD (PORK) LOCK FLUSH 100 UNIT/ML IV SOLN
250.0000 [IU] | INTRAVENOUS | Status: AC | PRN
  Administered 2021-04-02: 250 [IU]
  Filled 2021-04-02: qty 2.5

## 2021-04-02 MED ORDER — METFORMIN HCL 500 MG PO TABS: 500.0000 mg | ORAL_TABLET | Freq: Two times a day (BID) | ORAL | 1 refills | Status: DC

## 2021-04-02 MED ORDER — ASPIRIN 81 MG PO TBEC: 81.0000 mg | DELAYED_RELEASE_TABLET | Freq: Every day | ORAL | 11 refills | Status: DC

## 2021-04-02 MED ORDER — CEFAZOLIN IV (FOR PTA / DISCHARGE USE ONLY): 2.0000 g | Freq: Three times a day (TID) | INTRAVENOUS | 0 refills | Status: DC

## 2021-04-02 MED ORDER — GLIPIZIDE 5 MG PO TABS: 5.0000 mg | ORAL_TABLET | Freq: Two times a day (BID) | ORAL | 1 refills | Status: DC

## 2021-04-02 MED ORDER — AMLODIPINE BESYLATE 10 MG PO TABS: 10.0000 mg | ORAL_TABLET | Freq: Every day | ORAL | 1 refills | Status: DC

## 2021-04-02 MED ORDER — SODIUM CHLORIDE 0.9% FLUSH: 10.0000 mL | Freq: Two times a day (BID) | INTRAVENOUS | Status: DC

## 2021-04-02 MED ORDER — SODIUM CHLORIDE 0.9% FLUSH: 10.0000 mL | INTRAVENOUS | Status: DC | PRN

## 2021-04-02 MED ORDER — METRONIDAZOLE 500 MG PO TABS: 500.0000 mg | ORAL_TABLET | Freq: Two times a day (BID) | ORAL | 0 refills | Status: DC

## 2021-04-02 NOTE — Evaluation (Signed)
Occupational Therapy Evaluation Patient Details Name: Eugene Diaz MRN: [id number] DOB: November 27, 1965 Today's Date: 04/02/2021    History of Present Illness 55 y.o. male, presents the ED 5/28 with a chief complaint of right great toe swelling. R foot x-ray reveals localized infection with underlying osteomyletis. s/p 5/30 drainage of R foot abcess and partial 1st ray resection. Returned to OR 6/2 for R foot wound debridement and closure.   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 Patient currently functioning below baseline demonstrating observed ADLs and functional transfers with Min A grossly. Patient also limited by deficits listed below including decreased safety awareness, decreased standing balance, pain in RLE, and deceased STM and would benefit from continued acute OT services in prep for safe d/c home. Patient declining use of RW despite education on increased BOS compared to crutches and risk of nerve injury in axilla. Education provided on safety with ADL transfers and proper use of crutches. Patient would benefit from continued education.     Follow Up Recommendations  Home health OT;Supervision/Assistance - 24 hour    Equipment Recommendations  None recommended by OT    Recommendations for Other Services       Precautions / Restrictions Precautions Precautions: Fall Restrictions Weight Bearing Restrictions: No RLE Weight Bearing: Weight bearing as tolerated      Mobility Bed Mobility               General bed mobility comments: Seated in recliner upon entry.    Transfers Overall transfer level: Needs assistance Equipment used: Crutches Transfers: Sit to/from  Stand;Lateral/Scoot Transfers Sit to Stand: Min assist        Lateral/Scoot Transfers: Supervision General transfer comment: Min A initially for steadying in standing with use of crutches. Cues for WBAT through R heel. Patient reports having bariatric crutches at home.    Balance Overall balance assessment: Needs assistance Sitting-balance support: Feet supported;No upper extremity supported;Feet unsupported Sitting balance-Leahy Scale: Good     Standing balance support: Bilateral upper extremity supported Standing balance-Leahy Scale: Poor                             ADL either performed or assessed with clinical judgement   ADL Overall ADL's : Needs assistance/impaired Eating/Feeding: Set up;Sitting   Grooming: Set up;Sitting                                       Vision Patient Visual Report: No change from baseline Vision Assessment?: No apparent visual deficits     Perception     Praxis      Pertinent Vitals/Pain Pain Assessment: Faces Faces Pain Scale: Hurts even more Pain Location: R foot Pain Descriptors / Indicators: Grimacing;Guarding Pain Intervention(s): Limited activity within patient's tolerance;Monitored during session;Repositioned     Hand Dominance Left   Extremity/Trunk Assessment Upper Extremity Assessment Upper Extremity Assessment: Defer to OT evaluation   Lower Extremity Assessment Lower Extremity Assessment: RLE deficits/detail RLE Deficits / Details: R great toe amputation, ankle ROM limited by swelling, ROM and strength in knee and hip WFL LLE Deficits / Details:  L BKA, hip and knee ROM WFL, strength grossly 3+/5       Communication Communication Communication: No difficulties   Cognition Arousal/Alertness: Awake/alert Behavior During Therapy: WFL for tasks assessed/performed Overall Cognitive Status: Impaired/Different from baseline Area of Impairment: Safety/judgement;Awareness;Problem solving;Memory                      Memory: Decreased short-term memory   Safety/Judgement: Decreased awareness of safety;Decreased awareness of deficits Awareness: Emergent Problem Solving: Slow processing;Difficulty sequencing;Requires verbal cues;Requires tactile cues General Comments: Demonstrates decreased STM reporting having lost crutches during hospital transfer. At time of OT eval, patient declining crutches from ortho tech stating that he has crutches at home. Decreased safety awareness and decreased knowledge of current deficits.   General Comments  VSS on RA    Exercises Exercises: General Lower Extremity General Exercises - Lower Extremity Ankle Circles/Pumps: AROM;Right;20 reps;Seated   Shoulder Instructions      Home Living Family/patient expects to be discharged to:: Private residence Living Arrangements: Alone Available Help at Discharge: Family;Available PRN/intermittently Type of Home: Mobile home Home Access: Stairs to enter Entrance Stairs-Number of Steps: 1 Entrance Stairs-Rails: None Home Layout: One level     Bathroom Shower/Tub: Teacher, early years/pre: Standard Bathroom Accessibility: Yes   Home Equipment: Wheelchair - manual;Bedside commode;Other (comment) (shower stool)          Prior Functioning/Environment Level of Independence: Independent with assistive device(s)        Comments: used crutches at home        OT Problem List: Impaired balance (sitting and/or standing);Decreased safety awareness;Decreased knowledge of use of DME or AE;Pain      OT Treatment/Interventions: Self-care/ADL training;Therapeutic exercise;Energy conservation;DME and/or AE instruction;Therapeutic activities;Patient/family education;Balance training;Cognitive remediation/compensation    OT Goals(Current goals can be found in the care plan section) Acute Rehab OT Goals Patient Stated Goal: stop drinking OT Goal Formulation: With patient Time For Goal  Achievement: 04/16/21 Potential to Achieve Goals: Good ADL Goals Pt Will Perform Grooming: with modified independence;sitting (at wheelchair level) Pt Will Perform Upper Body Dressing: with modified independence;sitting Pt Will Perform Lower Body Dressing: with modified independence;with adaptive equipment;sitting/lateral leans;sit to/from stand Pt Will Transfer to Toilet: with modified independence;ambulating (with use of crutches) Pt Will Perform Toileting - Clothing Manipulation and hygiene: with modified independence;sitting/lateral leans Pt Will Perform Tub/Shower Transfer: Tub transfer;shower seat (with use of crutches) Additional ADL Goal #1: Patient will score <8/28 on SBT indicating imrpoved cognition.  OT Frequency: Min 2X/week   Barriers to D/C: Decreased caregiver support  Lives alone       Co-evaluation              AM-PAC OT "6 Clicks" Daily Activity     Outcome Measure Help from another person eating meals?: None Help from another person taking care of personal grooming?: A Little Help from another person toileting, which includes using toliet, bedpan, or urinal?: A Lot Help from another person bathing (including washing, rinsing, drying)?: A Little Help from another person to put on and taking off regular upper body clothing?: A Little Help from another person to put on and taking off regular lower body clothing?: A Lot 6 Click Score: 17   End of Session Equipment Utilized During Treatment: Gait belt;Other (comment) (crutches) Nurse Communication: Mobility status  Activity Tolerance: Patient tolerated treatment well Patient left: in chair;with call bell/phone within reach;with chair alarm set  OT Visit Diagnosis: Unsteadiness on feet (R26.81);Muscle weakness (generalized) (M62.81);Pain  Pain - Right/Left: Right Pain - part of body: Ankle and joints of foot                Time: 1145-1201 OT Time Calculation (min): 16 min Charges:  OT General Charges $OT  Visit: 1 Visit OT Evaluation $OT Eval Moderate Complexity: 1 Mod  Envy Meno H. OTR/L Supplemental OT, Department of rehab services [phone]  Nichael Ehly R H. 04/02/2021, 12:18 PM

## 2021-04-02 NOTE — Progress Notes (Signed)
Subjective: No new complaints   Antibiotics:  Anti-infectives (From admission, onward)   Start     Dose/Rate Route Frequency Ordered Stop   04/02/21 0000  ceFAZolin (ANCEF) IVPB        2 g Intravenous Every 8 hours 04/02/21 1108 05/14/21 2359   04/02/21 0000  metroNIDAZOLE (FLAGYL) 500 MG tablet        500 mg Oral 2 times daily 04/02/21 1108 05/12/21 2359   03/31/21 2200  metroNIDAZOLE (FLAGYL) tablet 500 mg        500 mg Oral Every 12 hours 03/31/21 1324     03/31/21 2000  ceFAZolin (ANCEF) IVPB 2g/100 mL premix        2 g 200 mL/hr over 30 Minutes Intravenous Every 8 hours 03/31/21 1319     03/29/21 2200  vancomycin (VANCOREADY) IVPB 1250 mg/250 mL  Status:  Discontinued        1,250 mg 166.7 mL/hr over 90 Minutes Intravenous Every 12 hours 03/29/21 1939 03/30/21 1615   03/29/21 1513  vancomycin (VANCOCIN) powder  Status:  Discontinued          As needed 03/29/21 1514 03/29/21 1624   03/29/21 1145  ceFEPIme (MAXIPIME) 2 g in sodium chloride 0.9 % 100 mL IVPB  Status:  Discontinued        2 g 200 mL/hr over 30 Minutes Intravenous Every 8 hours 03/29/21 1050 03/31/21 1225   03/29/21 1130  metroNIDAZOLE (FLAGYL) IVPB 500 mg  Status:  Discontinued        500 mg 100 mL/hr over 60 Minutes Intravenous Every 8 hours 03/29/21 1035 03/31/21 1324   03/29/21 1101  vancomycin variable dose per unstable renal function (pharmacist dosing)  Status:  Discontinued         Does not apply See admin instructions 03/29/21 1105 03/29/21 1941   03/28/21 1900  cefTRIAXone (ROCEPHIN) 2 g in sodium chloride 0.9 % 100 mL IVPB  Status:  Discontinued        2 g 200 mL/hr over 30 Minutes Intravenous Every 24 hours 03/27/21 2129 03/29/21 1035   03/28/21 1800  vancomycin (VANCOREADY) IVPB 2000 mg/400 mL        2,000 mg 200 mL/hr over 120 Minutes Intravenous Every 12 hours 03/28/21 0922 03/29/21 1153   03/28/21 0600  vancomycin (VANCOREADY) IVPB 1250 mg/250 mL  Status:  Discontinued        1,250  mg 166.7 mL/hr over 90 Minutes Intravenous Every 12 hours 03/27/21 2118 03/28/21 0922   03/27/21 1800  vancomycin (VANCOREADY) IVPB 2000 mg/400 mL        2,000 mg 200 mL/hr over 120 Minutes Intravenous  Once 03/27/21 1737 03/27/21 2132   03/27/21 1730  vancomycin (VANCOCIN) IVPB 1000 mg/200 mL premix  Status:  Discontinued        1,000 mg 200 mL/hr over 60 Minutes Intravenous  Once 03/27/21 1720 03/27/21 1737   03/27/21 1730  cefTRIAXone (ROCEPHIN) 2 g in sodium chloride 0.9 % 100 mL IVPB        2 g 200 mL/hr over 30 Minutes Intravenous  Once 03/27/21 1720 03/27/21 2132      Medications: Scheduled Meds: . amLODipine  10 mg Oral Daily  . aspirin EC  81 mg Oral Daily  . atorvastatin  40 mg Oral QPM  . Chlorhexidine Gluconate Cloth  6 each Topical Q0600  . folic acid  1 mg Oral Daily  . heparin  5,000 Units Subcutaneous  Q8H  . hydrALAZINE  100 mg Oral Q8H  . hydrochlorothiazide  25 mg Oral Daily  . insulin aspart  0-15 Units Subcutaneous TID WC  . insulin aspart  0-5 Units Subcutaneous QHS  . insulin aspart  4 Units Subcutaneous TID WC  . lisinopril  40 mg Oral QHS  . metroNIDAZOLE  500 mg Oral Q12H  . multivitamin with minerals  1 tablet Oral Daily  . mupirocin ointment  1 application Nasal BID  . thiamine  100 mg Oral Daily   Continuous Infusions: . sodium chloride Stopped (04/02/21 0731)  .  ceFAZolin (ANCEF) IV Stopped (04/02/21 0731)   PRN Meds:.sodium chloride, acetaminophen **OR** acetaminophen, ALPRAZolam, hydrALAZINE, ondansetron **OR** ondansetron (ZOFRAN) IV, oxyCODONE, polyethylene glycol, traZODone    Objective: Weight change:   Intake/Output Summary (Last 24 hours) at 04/02/2021 1133 Last data filed at 04/02/2021 1000 Gross per 24 hour  Intake 943.66 ml  Output 3050 ml  Net -2106.34 ml   Blood pressure (!) 155/89, pulse 72, temperature 98.5 F (36.9 C), resp. rate 17, height 6\' 2"  (1.88 m), weight 131.5 kg, SpO2 97 %. Temp:  [97.5 F (36.4 C)-98.7 F (37.1  C)] 98.5 F (36.9 C) (06/03 0843) Pulse Rate:  [61-74] 72 (06/03 0843) Resp:  [12-19] 17 (06/03 0843) BP: (149-164)/(63-89) 155/89 (06/03 0843) SpO2:  [96 %-100 %] 97 % (06/03 0843) Weight:  [131.5 kg] 131.5 kg (06/02 1528)  Physical Exam: Physical Exam Constitutional:      Appearance: He is well-developed.  HENT:     Head: Normocephalic and atraumatic.  Eyes:     Conjunctiva/sclera: Conjunctivae normal.  Cardiovascular:     Rate and Rhythm: Normal rate and regular rhythm.  Pulmonary:     Effort: Pulmonary effort is normal. No respiratory distress.     Breath sounds: No wheezing.  Abdominal:     General: There is no distension.     Palpations: Abdomen is soft.  Musculoskeletal:     Cervical back: Normal range of motion and neck supple.  Skin:    General: Skin is warm and dry.     Findings: No erythema or rash.  Neurological:     General: No focal deficit present.     Mental Status: He is alert and oriented to person, place, and time.  Psychiatric:        Mood and Affect: Mood normal.        Behavior: Behavior normal.        Thought Content: Thought content normal.        Judgment: Judgment normal.   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 C-Reactive Protein No results for input(s): CRP in the last 72 hours.  Micro Results: Recent Results (from the past 720 hour(s))  Blood Culture (routine x 2)  Status: Abnormal   Collection Time: 03/27/21  6:05 PM   Specimen: Right Antecubital; Blood  Result  Value Ref Range Status   Specimen Description   Final    RIGHT ANTECUBITAL Performed at [facility], [address] [place], [place] [address]    Special Requests   Final    Blood Culture adequate volume BOTTLES DRAWN AEROBIC AND ANAEROBIC Performed at [facility], [address] [place], [place] [address]    Culture  Setup Time   Final    GRAM POSITIVE COCCI Gram Stain Report Called to,Read Back By and Verified With: RACHEAL TATE,RN @1154  03/28/2021 KAY AEROBIC BOTTLE ONLY CRITICAL RESULT CALLED TO, READ BACK BY AND VERIFIED WITH: VICTORIA BASS RN @1627  03/28/21 EB ANAEROBIC BOTTLE ONLY GRAM POSITIVE COCCI Gram Stain Report Called to,Read Back By and Verified With: PREVIOUSLY CALLED @1154  03/28/2021 TO RACHEAL TATE,RN    Culture (A)  Final    STAPHYLOCOCCUS HOMINIS THE SIGNIFICANCE OF ISOLATING THIS ORGANISM FROM A SINGLE SET OF BLOOD CULTURES WHEN MULTIPLE SETS ARE DRAWN IS UNCERTAIN. PLEASE NOTIFY THE MICROBIOLOGY DEPARTMENT WITHIN ONE WEEK IF SPECIATION AND SENSITIVITIES ARE REQUIRED. Performed at [facility], [place] [address], [place], [place] [address]    Report Status 03/30/2021 FINAL  Final  Blood Culture ID Panel (Reflexed)     Status: Abnormal   Collection Time: 03/27/21  6:05 PM  Result Value Ref Range Status   Enterococcus faecalis NOT DETECTED NOT DETECTED Final   Enterococcus Faecium NOT DETECTED NOT DETECTED Final   Listeria monocytogenes NOT DETECTED NOT DETECTED Final   Staphylococcus species DETECTED (A) NOT DETECTED Final    Comment: CRITICAL RESULT CALLED TO, READ BACK BY AND VERIFIED WITH: VICTORIA BASS RN @1627  03/28/21 EB    Staphylococcus aureus (BCID) NOT DETECTED NOT DETECTED Final   Staphylococcus epidermidis NOT DETECTED NOT DETECTED Final   Staphylococcus lugdunensis NOT DETECTED NOT DETECTED Final   Streptococcus species NOT DETECTED NOT DETECTED Final   Streptococcus agalactiae NOT DETECTED NOT DETECTED Final   Streptococcus pneumoniae NOT  DETECTED NOT DETECTED Final   Streptococcus pyogenes NOT DETECTED NOT DETECTED Final   A.calcoaceticus-baumannii NOT DETECTED NOT DETECTED Final   Bacteroides fragilis NOT DETECTED NOT DETECTED Final   Enterobacterales NOT DETECTED NOT DETECTED Final   Enterobacter cloacae complex NOT DETECTED NOT DETECTED Final   Escherichia coli NOT DETECTED NOT DETECTED Final   Klebsiella aerogenes NOT DETECTED NOT DETECTED Final   Klebsiella oxytoca NOT DETECTED NOT DETECTED Final   Klebsiella pneumoniae NOT DETECTED NOT DETECTED Final   Proteus species NOT DETECTED NOT DETECTED Final   Salmonella species NOT DETECTED NOT DETECTED Final   Serratia marcescens NOT DETECTED NOT DETECTED Final   Haemophilus influenzae NOT DETECTED NOT DETECTED Final   Neisseria meningitidis NOT DETECTED NOT DETECTED Final   Pseudomonas aeruginosa NOT DETECTED NOT DETECTED Final   Stenotrophomonas maltophilia NOT DETECTED NOT DETECTED Final   Candida albicans NOT DETECTED NOT DETECTED Final   Candida auris NOT DETECTED NOT DETECTED Final   Candida glabrata NOT DETECTED NOT DETECTED Final   Candida krusei NOT DETECTED NOT DETECTED Final   Candida parapsilosis NOT DETECTED NOT DETECTED Final   Candida tropicalis NOT DETECTED NOT DETECTED Final   Cryptococcus neoformans/gattii NOT DETECTED NOT DETECTED Final    Comment: Performed at [facility], [address], [place], [place] [address]  Resp Panel by RT-PCR (Flu A&B, Covid) Nasopharyngeal Swab     Status: None   Collection Time:  03/27/21  6:07 PM   Specimen: Nasopharyngeal Swab; Nasopharyngeal(NP) swabs in vial transport medium  Result Value Ref Range Status   SARS Coronavirus 2 by RT PCR NEGATIVE NEGATIVE Final    Comment: (NOTE) SARS-CoV-2 target nucleic acids are NOT DETECTED.  The SARS-CoV-2 RNA is generally detectable in upper respiratory specimens during the acute phase of infection. The lowest concentration of SARS-CoV-2 viral copies this assay can  detect is 138 copies/mL. A negative result does not preclude SARS-Cov-2 infection and should not be used as the sole basis for treatment or other patient management decisions. A negative result may occur with  improper specimen collection/handling, submission of specimen other than nasopharyngeal swab, presence of viral mutation(s) within the areas targeted by this assay, and inadequate number of viral copies(<138 copies/mL). A negative result must be combined with clinical observations, patient history, and epidemiological information. The expected result is Negative.  Fact Sheet for Patients:  EntrepreneurPulse.com.au  Fact Sheet for Healthcare Providers:  IncredibleEmployment.be  This test is no t yet approved or cleared by the [place] FDA and  has been authorized for detection and/or diagnosis of SARS-CoV-2 by FDA under an Emergency Use Authorization (EUA). This EUA will remain  in effect (meaning this test can be used) for the duration of the COVID-19 declaration under Section 564(b)(1) of the Act, 21 U.S.C.section 360bbb-3(b)(1), unless the authorization is terminated  or revoked sooner.       Influenza A by PCR NEGATIVE NEGATIVE Final   Influenza B by PCR NEGATIVE NEGATIVE Final    Comment: (NOTE) The Xpert Xpress SARS-CoV-2/FLU/RSV plus assay is intended as an aid in the diagnosis of influenza from Nasopharyngeal swab specimens and should not be used as a sole basis for treatment. Nasal washings and aspirates are unacceptable for Xpert Xpress SARS-CoV-2/FLU/RSV testing.  Fact Sheet for Patients: EntrepreneurPulse.com.au  Fact Sheet for Healthcare Providers: IncredibleEmployment.be  This test is not yet approved or cleared by the [place] FDA and has been authorized for detection and/or diagnosis of SARS-CoV-2 by FDA under an Emergency Use Authorization (EUA). This EUA will remain in  effect (meaning this test can be used) for the duration of the COVID-19 declaration under Section 564(b)(1) of the Act, 21 U.S.C. section 360bbb-3(b)(1), unless the authorization is terminated or revoked.  Performed at [facility], [address], [place], [place] [address]   Blood Culture (routine x 2)     Status: None   Collection Time: 03/27/21  6:20 PM   Specimen: BLOOD LEFT HAND  Result Value Ref Range Status   Specimen Description BLOOD LEFT HAND  Final   Special Requests   Final    Blood Culture adequate volume BOTTLES DRAWN AEROBIC AND ANAEROBIC   Culture   Final    NO GROWTH 5 DAYS Performed at [facility], [address] [place], [place] [address]    Report Status 04/01/2021 FINAL  Final  Culture, blood (Routine X 2) w Reflex to ID Panel     Status: None (Preliminary result)   Collection Time: 03/29/21  2:00 AM   Specimen: BLOOD RIGHT HAND  Result Value Ref Range Status   Specimen Description BLOOD RIGHT HAND  Final   Special Requests   Final    BOTTLES DRAWN AEROBIC AND ANAEROBIC Blood Culture adequate volume   Culture   Final    NO GROWTH 4 DAYS Performed at [facility], [place] [address] [place], [place] [address]    Report Status PENDING  Incomplete  Culture, blood (Routine X 2) w Reflex to ID Panel     Status: None (Preliminary result)   Collection Time: 03/29/21  2:08 AM   Specimen: BLOOD LEFT HAND  Result Value Ref Range Status   Specimen Description BLOOD LEFT HAND  Final   Special Requests   Final    BOTTLES DRAWN AEROBIC AND ANAEROBIC Blood Culture adequate volume   Culture   Final    NO GROWTH 4 DAYS Performed at [facility], [address], [place], [place] [address]    Report Status PENDING  Incomplete  Aerobic/Anaerobic Culture w Gram Stain (surgical/deep wound)     Status: None   Collection Time: 03/29/21  3:50 PM   Specimen: PATH Soft tissue  Result Value Ref Range Status   Specimen Description TISSUE RIGHT TOE  Final   Special  Requests RT GREAT TOE SWAB  Final   Gram Stain   Final    NO WBC SEEN MODERATE GRAM POSITIVE COCCI RARE GRAM NEGATIVE RODS    Culture   Final    ABUNDANT ESCHERICHIA COLI MODERATE STREPTOCOCCUS AGALACTIAE TESTING AGAINST S. AGALACTIAE NOT ROUTINELY PERFORMED DUE TO PREDICTABILITY OF AMP/PEN/VAN SUSCEPTIBILITY. MODERATE PREVOTELLA DISIENS BETA LACTAMASE POSITIVE Performed at [facility], [place] [address] [place], [place] [address]    Report Status 04/02/2021 FINAL  Final   Organism ID, Bacteria ESCHERICHIA COLI  Final      Susceptibility   Escherichia coli - MIC*    AMPICILLIN <=2 SENSITIVE Sensitive     CEFAZOLIN <=4 SENSITIVE Sensitive     CEFEPIME <=0.12 SENSITIVE Sensitive     CEFTAZIDIME <=1 SENSITIVE Sensitive     CEFTRIAXONE <=0.25 SENSITIVE Sensitive     CIPROFLOXACIN <=0.25 SENSITIVE Sensitive     GENTAMICIN <=1 SENSITIVE Sensitive     IMIPENEM <=0.25 SENSITIVE Sensitive     TRIMETH/SULFA <=20 SENSITIVE Sensitive     AMPICILLIN/SULBACTAM <=2 SENSITIVE Sensitive     PIP/TAZO <=4 SENSITIVE Sensitive     * ABUNDANT ESCHERICHIA COLI  Aerobic/Anaerobic Culture w Gram Stain (surgical/deep wound)     Status: None (Preliminary result)   Collection Time: 03/29/21  4:04 PM   Specimen: Soft Tissue, Other  Result Value Ref Range Status   Specimen Description TISSUE  Final   Special Requests RT 1ST METATARSAL  Final   Gram Stain NO WBC SEEN NO ORGANISMS SEEN   Final   Culture   Final    NO GROWTH 4 DAYS NO ANAEROBES ISOLATED; CULTURE IN PROGRESS FOR 5 DAYS Performed at [facility], [address] [place], [place] [address]    Report Status PENDING  Incomplete  Urine culture     Status: None   Collection Time: 03/29/21  6:09 PM   Specimen: Urine, Clean Catch  Result Value Ref Range Status   Specimen Description URINE, CLEAN CATCH  Final   Special Requests NONE  Final   Culture   Final    NO GROWTH Performed at [facility], [place] [address],  [place], [place] [address]    Report Status 03/31/2021 FINAL  Final  Surgical pcr screen     Status: Abnormal   Collection Time: 03/31/21  2:54 PM   Specimen: Nasal Mucosa; Nasal Swab  Result Value Ref Range Status   MRSA, PCR NEGATIVE NEGATIVE Final   Staphylococcus aureus POSITIVE (A) NEGATIVE Final    Comment: (NOTE) The Xpert SA Assay (FDA approved for NASAL specimens in patients 22 years of  age and older), is one component of a comprehensive surveillance program. It is not intended to diagnose infection nor to guide or monitor treatment. Performed at [facility], [place] [address] [place], [place] [address]     Studies/Results: [place] EKG SITE RITE  Result Date: 04/01/2021 If [employer] image not attached, placement could not be confirmed due to current cardiac rhythm.     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 male with extensive smoking history, alcohol abuse peripheral neuropathy, diabetes who is undergone left above-the-knee amputation, now with osteomyelitis involving his first metatarsal phalangeal tarsal head with an abscess along the flexor houses brevis muscle with gangrenous changes, status post surgery yesterday with I&D of foot abscess and partial first ray resection.  Now had further debridement as well as wound closure and bone biopsy of the first metatarsal.  Polymicrobial osteomyelitis:  Narrowed to cefazolin and metronidazole   Alcoholism with alcohol induced neuropathy: He was drinking nearly a gallon of hard  liquor per day.  He continues to maintain that he will stay sober  is quit and is confident that he can stay sober after being discharged in the hospital. We are going to use BID metronidazole dose just in case he does relpase  I will treat him for 6 weeks with followup with [place].  I spent more than 35 minutes with the patient including greater than 50% of time in face to face counseling of the patient personally reviewing radiographs, long with pertinent laboratory microbiological data review of medical records and in coordination of his care.    Eugene Diaz has an appointment on 04/14/2021 at 1045 AM with [name] at  [facility] is located in the [facility] at  [place] in [place].  [address], which is located to the left of the elevators.  Phone: [phone]  Fax: [phone]  [email or web address]  He should arrive 15-30 minutes prior to his appointment.  I have given him my card with the appointment time and date on it.  I will sign off for now please call with further questions or if the patient ends up needing further surgical intervention and new cultures are obtained.     LOS: 6 days   Alcide Evener 04/02/2021, 11:33 AM

## 2021-04-02 NOTE — Progress Notes (Signed)
Peripherally Inserted Central Catheter Placement  The IV Nurse has discussed with the patient and/or persons authorized to consent for the patient, the purpose of this procedure and the potential benefits and risks involved with this procedure.  The benefits include less needle sticks, lab draws from the catheter, and the patient may be discharged home with the catheter. Risks include, but not limited to, infection, bleeding, blood clot (thrombus formation), and puncture of an artery; nerve damage and irregular heartbeat and possibility to perform a PICC exchange if needed/ordered by physician.  Alternatives to this procedure were also discussed.  Bard Power PICC patient education guide, fact sheet on infection prevention and patient information card has been provided to patient /or left at bedside.    PICC Placement Documentation  PICC Single Lumen 04/02/21 Right Brachial 41 cm 0 cm (Active)  Indication for Insertion or Continuance of Line Home intravenous therapies (PICC only) 04/02/21 1400  Exposed Catheter (cm) 0 cm 04/02/21 1400  Site Assessment Clean;Dry;Intact 04/02/21 1400  Line Status Flushed;Blood return noted 04/02/21 1400  Dressing Type Transparent 04/02/21 1400  Dressing Status Clean;Dry;Intact 04/02/21 1400  Antimicrobial disc in place? Yes 04/02/21 1400  Dressing Change Due 04/09/21 04/02/21 1400       Jule Economy Horton 04/02/2021, 2:12 PM

## 2021-04-02 NOTE — Discharge Summary (Signed)
Physician Discharge Summary  Eugene Diaz TGY:[id number] DOB: 07-20-1966 DOA: 03/27/2021  PCP: [name], PA-C  Admit date: 03/27/2021 Discharge date: 04/02/2021  Admitted From: Home Disposition: Home  Recommendations for Outpatient Follow-up:  1. Follow ups as below. 2. ID and podiatry to arrange outpatient follow-up 3. CBC with differential and BMP weekly 4. CRP and ESR biweekly 5. Please follow up on the following pending results: Surgical pathology and culture  Home Health: PT/OT/RN Equipment/Devices: Crutches  Discharge Condition: Stable CODE STATUS: Full code   Follow-up Information    [name], PA-C. Schedule an appointment as soon as possible for a visit in 1 week(s).   Specialties: Physician Assistant, Internal Medicine Contact information: [place] [address] [phone]                Hospital Course: 55 year old M with PMH of NIDDM-2, PVD, left BKA, neuropathy, chronic right foot ulcer, alcohol abuse, anxiety and depression presented [facility] with right great toe swelling, discoloration, malodorous discharge and worsening right foot ulcer over the ball of his right foot, and admitted for right foot osteomyelitis with right foot diabetic wound infection.  Cultures obtained.  He was a started on ceftriaxone and vancomycin.  X-ray concerning for osteomyelitis in right great toe.  He was evaluated by general surgery, and transferred to [facility] for evaluation and management by podiatry.  MRI of right foot consistent with right foot cellulitis, abscess and osteomyelitis of the right great toe.  Antibiotics broadened to cefepime, Flagyl and vancomycin.   Patient underwent I&D of right foot complex abscess and partial right first ray resection by [name] on 5/30.  Surgical culture with E. coli and moderate Streptococcus agalactiae.  Antibiotics de-escalated to Ancef and Flagyl.  Patient to returned to the OR for  further I&D on 6/2.  ID recommended antibiotics for total of 6 weeks, through 05/12/2021.  PICC line placed.  ID to arrange outpatient follow-up.    Patient has been started on metformin and low-dose glipizide for his diabetes.  See individual problem list below for more on hospital course.  Discharge Diagnoses:  Right foot cellulitis/abscess/osteomyelitis/diabetic wound infection-MRI right foot concerning for cellulitis, abscess and osteomyelitis.  Inflammatory markers elevated.  [name] with noncompressible PTA.  Culture data as above. S/p I&D and partial right first ray resection -Podiatry and infectious disease following -Vancomycin, cefepime and Flagyl>> IV Ancef and PO Flagyl through 05/12/2021 -Confident about not drinking alcohol.  Says he has been sober for 2 weeks prior to admission. -Home health PT/OT/RN -ID and podiatry to arrange outpatient follow-up.  Positive blood culture: Blood culture from 5/28 with staph species but not aureus in 1 out of 2 bottles which is likely contaminant.  Repeat blood culture NGTD -Antibiotics as above   Controlled NIDDM-2 with hyperglycemia, neuropathy and diabetic wound infection: A1c 6.3%. No results for input(s): HGBA1C in the last 72 hours. Recent Labs  Lab 04/01/21 1500 04/01/21 1728 04/01/21 2134 04/02/21 0716 04/02/21 1124  GLUCAP 139* 161* 227* 134* 164*  -Discharged on metformin and glipizide -Atorvastatin  Uncontrolled hypertension: BP elevated but improved. -Discharged on lisinopril, amlodipine and hydralazine  PAD-[name] 0.98 but noncompressible PTA with monophasic waveforms in both PT and DP.  Not a smoker. 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  Uses prosthesis.  Hyperlipidemia: Total cholesterol 84.  HDL <10.  -Continue statin  Alcohol abuse: Reportedly quit drinking about 2 weeks POA.  Heavy drinker before that.  No withdrawal symptoms. -Multivitamins  Anxiety and depression:  Stable -Continue home medications   Morbid obesity Body mass index is 37.23 kg/m.  -Encourage lifestyle change to lose weight.          Discharge Exam: Vitals:   04/02/21 0455 04/02/21 0843  BP: (!) 151/63 (!) 155/89  Pulse: 67 72  Resp: 17 17  Temp: 98.4 F (36.9 C) 98.5 F (36.9 C)  SpO2: 96% 97%    GENERAL: No apparent distress.  Nontoxic. HEENT: MMM.  Vision and hearing grossly intact.  NECK: Supple.  No apparent JVD.  RESP: On RA.  No IWOB.  Fair aeration bilaterally. CVS:  RRR. Heart sounds normal.  ABD/GI/GU: Bowel sounds present. Soft. Non tender.  MSK/EXT: Left BKA.  Dressing over right foot DCI. SKIN: no apparent skin lesion or wound NEURO: Awake, alert and oriented appropriately.  No apparent focal neuro deficit. PSYCH: Calm. Normal affect.  Discharge Instructions  Discharge Instructions    [facility] pharmacist to adjust dose for Vancomycin, Aminoglycosides and other anti-infective therapies as requested by physician.   Complete by: As directed    [facility] to provide Cath Flo 40m   Complete by: As directed    Administer for PICC line occlusion and as ordered by physician for other access device issues.   Anaphylaxis Kit: Provided to treat any anaphylactic reaction to the medication being provided to the patient if First Dose or when requested by physician   Complete by: As directed    Epinephrine 14mml vial / amp: Administer 0.7m26m0.7ml34mubcutaneously once for moderate to severe anaphylaxis, nurse to call physician and pharmacy when reaction occurs and call 911 if needed for immediate care   Diphenhydramine 50mg58mIV vial: Administer 25-50mg 67mM PRN for first dose reaction, rash, itching, mild reaction, nurse to call physician and pharmacy when reaction occurs   Sodium Chloride 0.9% NS 500ml I42mdminister if needed for hypovolemic blood pressure drop or as ordered by physician after call to physician with anaphylactic  reaction   Call MD for:  difficulty breathing, headache or visual disturbances   Complete by: As directed    Call MD for:  extreme fatigue   Complete by: As directed    Call MD for:  persistant dizziness or light-headedness   Complete by: As directed    Call MD for:  persistant nausea and vomiting   Complete by: As directed    Call MD for:  redness, tenderness, or signs of infection (pain, swelling, redness, odor or green/yellow discharge around incision site)   Complete by: As directed    Call MD for:  severe uncontrolled pain   Complete by: As directed    Call MD for:  temperature >100.4   Complete by: As directed    Change dressing on IV access line weekly and PRN   Complete by: As directed    Diet - low sodium heart healthy   Complete by: As directed    Diet Carb Modified   Complete by: As directed    Discharge instructions   Complete by: As directed    It has been a pleasure taking care of you!  You were hospitalized with right foot wound infection.  You have been treated surgically medically.  We are discharging you on more antibiotics to complete treatment course for the next 6 weeks.  We have also started you on new medication for your diabetes and added some blood pressure medications  as well.  Please review your new medication list and the directions on your medications before you take them.  It is very important that you quit drinking alcohol while you are antibiotics for your foot infection.  Please follow-up with your primary care doctor in 1 to 2 weeks.  The infectious disease expert will arrange outpatient follow-up.    Take care,   Flush IV access with Sodium Chloride 0.9% and Heparin 10 units/ml or 100 units/ml   Complete by: As directed    Home infusion instructions - [facility]   Complete by: As directed    Instructions: Flush IV access with Sodium Chloride 0.9% and Heparin 10units/ml or 100units/ml   Change dressing on IV access line: Weekly and PRN    Instructions Cath Flo 95m: Administer for PICC Line occlusion and as ordered by physician for other access device   [facility] pharmacist to adjust dose for: Vancomycin, Aminoglycosides and other anti-infective therapies as requested by physician   Increase activity slowly   Complete by: As directed    Method of administration may be changed at the discretion of home infusion pharmacist based upon assessment of the patient and/or caregiver's ability to self-administer the medication ordered   Complete by: As directed    No wound care   Complete by: As directed      Allergies as of 04/02/2021   No Known Allergies     Medication List    STOP taking these medications   albuterol 108 (90 Base) MCG/ACT inhaler Commonly known as: VENTOLIN HFA   amitriptyline 100 MG tablet Commonly known as: ELAVIL   cyanocobalamin 100 MCG tablet   cyclobenzaprine 5 MG tablet Commonly known as: FLEXERIL   docusate sodium 100 MG capsule Commonly known as: COLACE   doxycycline 100 MG tablet Commonly known as: VIBRA-TABS   DULoxetine 60 MG capsule Commonly known as: CYMBALTA   furosemide 20 MG tablet Commonly known as: LASIX   glimepiride 4 MG tablet Commonly known as: AMARYL   glycopyrrolate 1 MG tablet Commonly known as: ROBINUL   hydrOXYzine 25 MG tablet Commonly known as: ATARAX/VISTARIL   morphine 30 MG 12 hr tablet Commonly known as: MS CONTIN   omeprazole 20 MG capsule Commonly known as: PRILOSEC   oxycodone 30 MG immediate release tablet Commonly known as: ROXICODONE   primidone 50 MG tablet Commonly known as: MYSOLINE   senna-docusate 8.6-50 MG tablet Commonly known as: Senokot-S   silver sulfADIAZINE 1 % cream Commonly known as: Silvadene     TAKE these medications   acetaminophen 325 MG tablet Commonly known as: TYLENOL Take 2 tablets (650 mg total) by mouth every 6 (six) hours as needed for mild pain (or Fever >/= 101).   ALPRAZolam 1 MG  tablet Commonly known as: XANAX Take 0.5 tablets (0.5 mg total) by mouth 4 (four) times daily as needed for anxiety.   amLODipine 10 MG tablet Commonly known as: NORVASC Take 1 tablet (10 mg total) by mouth daily. Start taking on: April 03, 2021   aspirin 81 MG EC tablet Take 1 tablet (81 mg total) by mouth daily. Swallow whole. Start taking on: April 03, 2021   atorvastatin 40 MG tablet Commonly known as: LIPITOR Take 40 mg by mouth every evening.   ceFAZolin  IVPB Commonly known as: ANCEF Inject 2 g into the vein every 8 (eight) hours. Indication:  Osteomyelitis  First Dose: Yes Last Day of Therapy:  05/12/21 Labs - Once weekly:  CBC/D and BMP, Labs - Every other week:  ESR and CRP Method of administration: IV Push Method of administration may be changed at the discretion of home infusion pharmacist based upon assessment of the patient and/or caregiver's ability to self-administer the medication ordered.   diclofenac Sodium 1 % Gel Commonly known as: VOLTAREN Apply 1 application topically daily as needed (pain).   ferrous sulfate 325 (65 FE) MG tablet Take 1 tablet (325 mg total) by mouth daily.   folic acid 1 MG tablet Commonly known as: FOLVITE Take 1 tablet (1 mg total) by mouth daily.   gabapentin 300 MG capsule Commonly known as: NEURONTIN TAKE 3 CAPSULES BY MOUTH IN THE MORNING, 3 CAPSULES AT NOON, AND 4 CAPSULES AT BEDTIME FOR NERVE PAIN.   glipiZIDE 5 MG tablet Commonly known as: GLUCOTROL Take 1 tablet (5 mg total) by mouth 2 (two) times daily.   hydrALAZINE 50 MG tablet Commonly known as: APRESOLINE Take 1 tablet (50 mg total) by mouth every 8 (eight) hours.   lidocaine 5 % ointment Commonly known as: XYLOCAINE Apply topically 2 (two) times daily as needed.   lisinopril 40 MG tablet Commonly known as: ZESTRIL Take 1 tablet (40 mg total) by mouth at bedtime. What changed: Another medication with the same name was removed. Continue taking this medication,  and follow the directions you see here.   metFORMIN 500 MG tablet Commonly known as: Glucophage Take 1 tablet (500 mg total) by mouth 2 (two) times daily with a meal.   metroNIDAZOLE 500 MG tablet Commonly known as: FLAGYL Take 1 tablet (500 mg total) by mouth 2 (two) times daily. Treat through 05/12/21   multivitamin with minerals Tabs tablet Take 1 tablet by mouth daily.   thiamine 100 MG tablet Take 1 tablet (100 mg total) by mouth daily.            Durable Medical Equipment  (From admission, onward)         Start     Ordered   04/02/21 1139  For home use only DME Crutches  Once        04/02/21 1139           Discharge Care Instructions  (From admission, onward)         Start     Ordered   04/02/21 0000  Change dressing on IV access line weekly and PRN  (Home infusion instructions - [facility] )        04/02/21 1108          Consultations:  Podiatry  Infectious disease  Procedures/Studies: 5/30 and 6/2-I&D of right foot complex abscess and partial right first ray resection by [name]    MR FOOT RIGHT W WO CONTRAST  Result Date: 03/29/2021 CLINICAL DATA:  History of diabetes.  Right great toe swelling. EXAM: MRI OF THE RIGHT FOREFOOT WITHOUT AND WITH CONTRAST TECHNIQUE: Multiplanar, multisequence MR imaging of the right forefoot was performed before and after the administration of intravenous contrast. CONTRAST:  17m GADAVIST GADOBUTROL 1 MMOL/ML IV SOLN COMPARISON:  None. FINDINGS: Significant patient motion degrades image quality limiting evaluation. Bones/Joint/Cartilage Soft tissue wound along the plantar aspect of the first MTP joint. Large first MTP joint effusion with synovial enhancement. Bone marrow edema in the first metatarsal head, first proximal phalanx and medial hallux sesamoid. No acute fracture or dislocation. Normal alignment. 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 Mild osteoarthritis of the navicular-middle cuneiform  joint. Ligaments Collateral ligaments are  intact.  Lisfranc ligament is intact. Muscles and Tendons Flexor, peroneal and extensor compartment tendons are intact. Diffuse T2 hyperintense signal throughout the plantar musculature concerning for mild myositis. Complex peripherally enhancing fluid collection in the flexor hallucis brevis muscle measuring 1.6 x 1 x 2.9 cm most consistent with an abscess. Soft tissue Severe edema of the great toe extending into the dorsal forefoot consistent with cellulitis. Soft tissue emphysema within the great toe. Large soft tissue wound along the plantar lateral aspect of the first MTP joint. Complex fluid collection within the subcutaneous fat along the medial aspect of the first MTP joint measuring approximately 4.2 x 1 x 3.4 cm. IMPRESSION: 1. Large soft tissue wound along the plantar lateral aspect of the first MTP joint with severe cellulitis of the great toe extending into the dorsal aspect of the foot and soft tissue emphysema within the great toe. Abscess within the subcutaneous fat along the medial aspect of the first MTP joint measuring approximately 4.2 x 1 x 3.4 cm. Complex peripherally enhancing fluid collection in the flexor hallucis brevis muscle measuring 1.6 x 1 x 2.9 cm most consistent with an abscess. 2. Septic arthritis of the first MTP joint with osteomyelitis of the first metatarsal head, first proximal phalanx and medial hallux sesamoid. 3. Diffuse T2 hyperintense signal throughout the plantar musculature concerning for myositis. Electronically Signed   By: Kathreen Devoid   On: 03/29/2021 10:09   US ARTERIAL [name] (SCREENING LOWER EXTREMITY)  Result Date: 03/28/2021 CLINICAL DATA:  55 year old male with right toe ulceration. EXAM: NONINVASIVE PHYSIOLOGIC VASCULAR STUDY OF BILATERAL LOWER EXTREMITIES TECHNIQUE: Evaluation of both lower extremities were performed at rest, including calculation of ankle-brachial indices with single level Doppler, pressure and  pulse volume recording. COMPARISON:  06/30/2015 FINDINGS: Right [name]:  0.98 Left [name]:  Not obtained. Right Lower Extremity: Monophasic waveforms at the level of the ankle. The posterior tibial artery is noncompressible. IMPRESSION: Normal right lower extremity ankle brachial index of 0.98, previously 1.06, however the posterior tibial artery is noncompressible and there are monophasic waveforms in the posterior tibial and dorsalis pedis arteries, suggestive of spuriously elevated [name]. Ruthann Cancer, MD [facility] Electronically Signed   By: Ruthann Cancer MD   On: 03/28/2021 13:31   DG Chest Port 1 View  Result Date: 03/27/2021 CLINICAL DATA:  Known right foot drainage with first toe ulcer and possible sepsis, initial encounter EXAM: PORTABLE CHEST 1 VIEW COMPARISON:  02/24/2021 FINDINGS: The heart size and mediastinal contours are within normal limits. Both lungs are clear. The visualized skeletal structures are unremarkable. IMPRESSION: No active disease. Electronically Signed   By: Inez Catalina M.D.   On: 03/27/2021 17:45   DG Foot 2 Views Right  Result Date: 03/29/2021 CLINICAL DATA:  Status post amputation EXAM: RIGHT FOOT - 2 VIEW COMPARISON:  03/27/2021 FINDINGS: There has been interval transmetatarsal amputation of the first toe. No other amputation is seen. Irregularity in the soft tissues is noted related to the recent surgery. No other focal abnormality is noted. IMPRESSION: Interval first transmetatarsal amputation. Electronically Signed   By: Inez Catalina M.D.   On: 03/29/2021 17:46   DG Foot Complete Right  Result Date: 03/27/2021 CLINICAL DATA:  First toe ulcer EXAM: RIGHT FOOT COMPLETE - 3+ VIEW COMPARISON:  09/13/2021 FINDINGS: There is a soft tissue ulcer beneath the head of the first metatarsal. Considerable subcutaneous air is noted consistent with localized infection. No acute fracture is seen. There is a  lucent area in the  medial aspect of the first metatarsal head which appears slightly larger than that seen on the prior exam. Possibility of osteomyelitis would deserve consideration. MRI would be more sensitive in this regard. No other bony abnormality is noted. IMPRESSION: Soft tissue ulcer with considerable subcutaneous air consistent with localized infection. There is an area of lucency in the medial aspect of the first metatarsal head which appears slightly larger than that seen on the prior exam. Underlying osteomyelitis would be difficult to exclude. MRI with the flow in this regard as clinically necessary. Electronically Signed   By: Inez Catalina M.D.   On: 03/27/2021 17:49   [place] EKG SITE RITE  Result Date: 04/01/2021 If Site Rite image not attached, placement could not be confirmed due to current cardiac rhythm.       The results of significant diagnostics from this hospitalization (including imaging, microbiology, ancillary and laboratory) are listed below for reference.     Microbiology: Recent Results (from the past 240 hour(s))  Blood Culture (routine x 2)     Status: Abnormal   Collection Time: 03/27/21  6:05 PM   Specimen: Right Antecubital; Blood  Result Value Ref Range Status   Specimen Description   Final    RIGHT ANTECUBITAL Performed at [facility], [address], [place], [place] [address]    Special Requests   Final    Blood Culture adequate volume BOTTLES DRAWN AEROBIC AND ANAEROBIC Performed at [facility], [address], [place], [place] [address]    Culture  Setup Time   Final    GRAM POSITIVE COCCI Gram Stain Report Called to,Read Back By and Verified With: RACHEAL TATE,RN $RemoveBeforeD'@1154'[id number]$  03/28/2021 KAY AEROBIC BOTTLE ONLY CRITICAL RESULT CALLED TO, READ BACK BY AND VERIFIED WITH: VICTORIA BASS RN $Remov'@1627'[email or web address]$  03/28/21 EB ANAEROBIC BOTTLE ONLY GRAM POSITIVE COCCI Gram Stain Report Called to,Read Back By and Verified With: PREVIOUSLY CALLED $RemoveBeforeDEI'@1154'[id number]$  03/28/2021 TO RACHEAL TATE,RN    Culture (A)   Final    STAPHYLOCOCCUS HOMINIS THE SIGNIFICANCE OF ISOLATING THIS ORGANISM FROM A SINGLE SET OF BLOOD CULTURES WHEN MULTIPLE SETS ARE DRAWN IS UNCERTAIN. PLEASE NOTIFY THE MICROBIOLOGY DEPARTMENT WITHIN ONE WEEK IF SPECIATION AND SENSITIVITIES ARE REQUIRED. Performed at [facility], [place] [address], [place], [place] [address]    Report Status 03/30/2021 FINAL  Final  Blood Culture ID Panel (Reflexed)     Status: Abnormal   Collection Time: 03/27/21  6:05 PM  Result Value Ref Range Status   Enterococcus faecalis NOT DETECTED NOT DETECTED Final   Enterococcus Faecium NOT DETECTED NOT DETECTED Final   Listeria monocytogenes NOT DETECTED NOT DETECTED Final   Staphylococcus species DETECTED (A) NOT DETECTED Final    Comment: CRITICAL RESULT CALLED TO, READ BACK BY AND VERIFIED WITH: VICTORIA BASS RN $Remov'@1627'[email or web address]$  03/28/21 EB    Staphylococcus aureus (BCID) NOT DETECTED NOT DETECTED Final   Staphylococcus epidermidis NOT DETECTED NOT DETECTED Final   Staphylococcus lugdunensis NOT DETECTED NOT DETECTED Final   Streptococcus species NOT DETECTED NOT DETECTED Final   Streptococcus agalactiae NOT DETECTED NOT DETECTED Final   Streptococcus pneumoniae NOT DETECTED NOT DETECTED Final   Streptococcus pyogenes NOT DETECTED NOT DETECTED Final   A.calcoaceticus-baumannii NOT DETECTED NOT DETECTED Final   Bacteroides fragilis NOT DETECTED NOT DETECTED Final   Enterobacterales NOT DETECTED NOT DETECTED Final   Enterobacter cloacae complex NOT DETECTED NOT DETECTED Final   Escherichia coli NOT DETECTED NOT DETECTED Final   Klebsiella aerogenes NOT DETECTED  NOT DETECTED Final   Klebsiella oxytoca NOT DETECTED NOT DETECTED Final   Klebsiella pneumoniae NOT DETECTED NOT DETECTED Final   Proteus species NOT DETECTED NOT DETECTED Final   Salmonella species NOT DETECTED NOT DETECTED Final   Serratia marcescens NOT DETECTED NOT DETECTED Final   Haemophilus influenzae NOT DETECTED NOT DETECTED Final    Neisseria meningitidis NOT DETECTED NOT DETECTED Final   Pseudomonas aeruginosa NOT DETECTED NOT DETECTED Final   Stenotrophomonas maltophilia NOT DETECTED NOT DETECTED Final   Candida albicans NOT DETECTED NOT DETECTED Final   Candida auris NOT DETECTED NOT DETECTED Final   Candida glabrata NOT DETECTED NOT DETECTED Final   Candida krusei NOT DETECTED NOT DETECTED Final   Candida parapsilosis NOT DETECTED NOT DETECTED Final   Candida tropicalis NOT DETECTED NOT DETECTED Final   Cryptococcus neoformans/gattii NOT DETECTED NOT DETECTED Final    Comment: Performed at [facility], [place] [address], [place], [place] [address]  Resp Panel by RT-PCR (Flu A&B, Covid) Nasopharyngeal Swab     Status: None   Collection Time: 03/27/21  6:07 PM   Specimen: Nasopharyngeal Swab; Nasopharyngeal(NP) swabs in vial transport medium  Result Value Ref Range Status   SARS Coronavirus 2 by RT PCR NEGATIVE NEGATIVE Final    Comment: (NOTE) SARS-CoV-2 target nucleic acids are NOT DETECTED.  The SARS-CoV-2 RNA is generally detectable in upper respiratory specimens during the acute phase of infection. The lowest concentration of SARS-CoV-2 viral copies this assay can detect is 138 copies/mL. A negative result does not preclude SARS-Cov-2 infection and should not be used as the sole basis for treatment or other patient management decisions. A negative result may occur with  improper specimen collection/handling, submission of specimen other than nasopharyngeal swab, presence of viral mutation(s) within the areas targeted by this assay, and inadequate number of viral copies(<138 copies/mL). A negative result must be combined with clinical observations, patient history, and epidemiological information. The expected result is Negative.  Fact Sheet for Patients:  EntrepreneurPulse.com.au  Fact Sheet for Healthcare Providers:  IncredibleEmployment.be  This test is no t  yet approved or cleared by the [place] FDA and  has been authorized for detection and/or diagnosis of SARS-CoV-2 by FDA under an Emergency Use Authorization (EUA). This EUA will remain  in effect (meaning this test can be used) for the duration of the COVID-19 declaration under Section 564(b)(1) of the Act, 21 U.S.C.section 360bbb-3(b)(1), unless the authorization is terminated  or revoked sooner.       Influenza A by PCR NEGATIVE NEGATIVE Final   Influenza B by PCR NEGATIVE NEGATIVE Final    Comment: (NOTE) The Xpert Xpress SARS-CoV-2/FLU/RSV plus assay is intended as an aid in the diagnosis of influenza from Nasopharyngeal swab specimens and should not be used as a sole basis for treatment. Nasal washings and aspirates are unacceptable for Xpert Xpress SARS-CoV-2/FLU/RSV testing.  Fact Sheet for Patients: EntrepreneurPulse.com.au  Fact Sheet for Healthcare Providers: [email or web address]  This test is not yet approved or cleared by the [place] FDA and has been authorized for detection and/or diagnosis of SARS-CoV-2 by FDA under an Emergency Use Authorization (EUA). This EUA will remain in effect (meaning this test can be used) for the duration of the COVID-19 declaration under Section 564(b)(1) of the Act, 21 U.S.C. section 360bbb-3(b)(1), unless the authorization is terminated or revoked.  Performed at [facility], [address], [place],  [address]   Blood Culture (routine x 2)     Status:  None   Collection Time: 03/27/21  6:20 PM   Specimen: BLOOD LEFT HAND  Result Value Ref Range Status   Specimen Description BLOOD LEFT HAND  Final   Special Requests   Final    Blood Culture adequate volume BOTTLES DRAWN AEROBIC AND ANAEROBIC   Culture   Final    NO GROWTH 5 DAYS Performed at [facility], [address], [place], [place] [address]    Report Status 04/01/2021 FINAL  Final  Culture, blood (Routine X 2) w  Reflex to ID Panel     Status: None (Preliminary result)   Collection Time: 03/29/21  2:00 AM   Specimen: BLOOD RIGHT HAND  Result Value Ref Range Status   Specimen Description BLOOD RIGHT HAND  Final   Special Requests   Final    BOTTLES DRAWN AEROBIC AND ANAEROBIC Blood Culture adequate volume   Culture   Final    NO GROWTH 4 DAYS Performed at [facility], [place] [address] [place], [place] [address]    Report Status PENDING  Incomplete  Culture, blood (Routine X 2) w Reflex to ID Panel     Status: None (Preliminary result)   Collection Time: 03/29/21  2:08 AM   Specimen: BLOOD LEFT HAND  Result Value Ref Range Status   Specimen Description BLOOD LEFT HAND  Final   Special Requests   Final    BOTTLES DRAWN AEROBIC AND ANAEROBIC Blood Culture adequate volume   Culture   Final    NO GROWTH 4 DAYS Performed at [facility], [place] [address], [place], [place] [address]    Report Status PENDING  Incomplete  Aerobic/Anaerobic Culture w Gram Stain (surgical/deep wound)     Status: None   Collection Time: 03/29/21  3:50 PM   Specimen: PATH Soft tissue  Result Value Ref Range Status   Specimen Description TISSUE RIGHT TOE  Final   Special Requests RT GREAT TOE SWAB  Final   Gram Stain   Final    NO WBC SEEN MODERATE GRAM POSITIVE COCCI RARE GRAM NEGATIVE RODS    Culture   Final    ABUNDANT ESCHERICHIA COLI MODERATE STREPTOCOCCUS AGALACTIAE TESTING AGAINST S. AGALACTIAE NOT ROUTINELY PERFORMED DUE TO PREDICTABILITY OF AMP/PEN/VAN SUSCEPTIBILITY. MODERATE PREVOTELLA DISIENS BETA LACTAMASE POSITIVE Performed at [facility], [place] [address] [place],  [address]    Report Status 04/02/2021 FINAL  Final   Organism ID, Bacteria ESCHERICHIA COLI  Final      Susceptibility   Escherichia coli - MIC*    AMPICILLIN <=2 SENSITIVE Sensitive     CEFAZOLIN <=4 SENSITIVE Sensitive     CEFEPIME <=0.12 SENSITIVE Sensitive     CEFTAZIDIME <=1 SENSITIVE Sensitive      CEFTRIAXONE <=0.25 SENSITIVE Sensitive     CIPROFLOXACIN <=0.25 SENSITIVE Sensitive     GENTAMICIN <=1 SENSITIVE Sensitive     IMIPENEM <=0.25 SENSITIVE Sensitive     TRIMETH/SULFA <=20 SENSITIVE Sensitive     AMPICILLIN/SULBACTAM <=2 SENSITIVE Sensitive     PIP/TAZO <=4 SENSITIVE Sensitive     * ABUNDANT ESCHERICHIA COLI  Aerobic/Anaerobic Culture w Gram Stain (surgical/deep wound)     Status: None (Preliminary result)   Collection Time: 03/29/21  4:04 PM   Specimen: Soft Tissue, Other  Result Value Ref Range Status   Specimen Description TISSUE  Final   Special Requests RT 1ST METATARSAL  Final   Gram Stain NO WBC SEEN NO ORGANISMS SEEN   Final  Culture   Final    NO GROWTH 4 DAYS NO ANAEROBES ISOLATED; CULTURE IN PROGRESS FOR 5 DAYS Performed at [facility], [place] [address], [place], [place] [address]    Report Status PENDING  Incomplete  Urine culture     Status: None   Collection Time: 03/29/21  6:09 PM   Specimen: Urine, Clean Catch  Result Value Ref Range Status   Specimen Description URINE, CLEAN CATCH  Final   Special Requests NONE  Final   Culture   Final    NO GROWTH Performed at [facility], [place] [address] [place], [place] [address]    Report Status 03/31/2021 FINAL  Final  Surgical pcr screen     Status: Abnormal   Collection Time: 03/31/21  2:54 PM   Specimen: Nasal Mucosa; Nasal Swab  Result Value Ref Range Status   MRSA, PCR NEGATIVE NEGATIVE Final   Staphylococcus aureus POSITIVE (A) NEGATIVE Final    Comment: (NOTE) The Xpert SA Assay (FDA approved for NASAL specimens in patients 68 years of age and older), is one component of a comprehensive surveillance program. It is not intended to diagnose infection nor to guide or monitor treatment. Performed at [facility], [place] [address] [place], [place] [address]   Aerobic/Anaerobic Culture w Gram Stain (surgical/deep wound)     Status: None (Preliminary result)   Collection Time:  04/01/21  4:52 PM   Specimen: Soft Tissue, Other  Result Value Ref Range Status   Specimen Description BONE RIGHT FOOT  Final   Special Requests 1ST METATARSAL  Final   Gram Stain   Final    NO WBC SEEN NO ORGANISMS SEEN Performed at [facility], [place] [address] [place], [place] [address]    Culture PENDING  Incomplete   Report Status PENDING  Incomplete     Labs:  CBC: Recent Labs  Lab 03/27/21 1805 03/29/21 0210 03/29/21 1310 03/30/21 0341 03/31/21 0328 04/01/21 0310 04/02/21 0414  WBC 7.6   < > 9.0 9.2 7.8 7.7 7.4  NEUTROABS 6.2  --   --   --   --   --   --   HGB 12.4*   < > 9.7* 8.5* 8.9* 9.2* 8.9*  HCT 38.1*   < > 28.5* 26.1* 26.7* 27.5* 26.5*  MCV 88.6   < > 86.6 89.1 87.8 87.0 86.0  PLT 218   < > 254 292 314 376 350   < > = values in this interval not displayed.   BMP &GFR Recent Labs  Lab 03/28/21 0655 03/29/21 0210 03/29/21 1310 03/30/21 0341 03/31/21 0328 04/02/21 0414  NA  --  134* 134* 134* 137 136  K  --  2.8* 3.1* 3.5 4.1 3.5  CL  --  104 107 108 106 105  CO2  --  21* 22 19* 25 26  GLUCOSE  --  129* 145* 131* 134* 143*  BUN  --  13 11 9 6 6   CREATININE  --  0.85 0.75 0.81 0.92 0.80  CALCIUM  --  8.1* 8.3* 8.1* 8.6* 8.7*  MG 1.8  --  1.9 1.8 2.0 1.9  PHOS  --   --  2.9 3.0 2.8 3.2   Estimated Creatinine Clearance: 148.6 mL/min (by C-G formula based on SCr of 0.8 mg/dL). Liver & Pancreas: Recent Labs  Lab 03/27/21 1805 03/29/21 0210 03/29/21 1310 03/30/21 0341 03/31/21 0328 04/02/21 0414  AST 67* 49*  --   --  23  --   ALT 29 30  --   --  19  --   ALKPHOS 93 71  --   --  66  --   BILITOT 0.7 0.3  --   --  0.3  --   PROT 8.6* 6.1*  --   --  6.5  --   ALBUMIN 3.1* 2.1* 2.2* 2.0* 2.2* 2.4*   No results for input(s): LIPASE, AMYLASE in the last 168 hours. No results for input(s): AMMONIA in the last 168 hours. Diabetic: No results for input(s): HGBA1C in the last 72 hours. Recent Labs  Lab 04/01/21 1500 04/01/21 1728  04/01/21 2134 04/02/21 0716 04/02/21 1124  GLUCAP 139* 161* 227* 134* 164*   Cardiac Enzymes: No results for input(s): CKTOTAL, CKMB, CKMBINDEX, TROPONINI in the last 168 hours. No results for input(s): PROBNP in the last 8760 hours. Coagulation Profile: Recent Labs  Lab 03/27/21 1805  INR 1.1   Thyroid Function Tests: No results for input(s): TSH, T4TOTAL, FREET4, T3FREE, THYROIDAB in the last 72 hours. Lipid Profile: No results for input(s): CHOL, HDL, LDLCALC, TRIG, CHOLHDL, LDLDIRECT in the last 72 hours. Anemia Panel: No results for input(s): VITAMINB12, FOLATE, FERRITIN, TIBC, IRON, RETICCTPCT in the last 72 hours. Urine analysis:    Component Value Date/Time   COLORURINE STRAW (A) 03/29/2021 [place] 03/29/2021 1705   LABSPEC 1.010 03/29/2021 1705   PHURINE 6.0 03/29/2021 1705   GLUCOSEU NEGATIVE 03/29/2021 1705   HGBUR MODERATE (A) 03/29/2021 1705   BILIRUBINUR NEGATIVE 03/29/2021 [place] 03/29/2021 1705   PROTEINUR NEGATIVE 03/29/2021 1705   UROBILINOGEN 4.0 (H) 10/09/2008 1114   NITRITE NEGATIVE 03/29/2021 1705   LEUKOCYTESUR NEGATIVE 03/29/2021 1705   Sepsis Labs: Invalid input(s): PROCALCITONIN, LACTICIDVEN   Time coordinating discharge: 45 minutes  SIGNED:  Mercy Riding, MD  [facility] 04/02/2021, 10:34 PM  If 7PM-7AM, please contact night-coverage [email or web address]

## 2021-04-02 NOTE — TOC Progression Note (Signed)
Transition of Care [facility]) - Progression Note    Patient Details  Name: Eugene Diaz MRN: [id number] Date of Birth: Aug 23, 1966  Transition of Care [facility]) CM/SW Contact  Verdell Carmine, RN Phone Number: 04/02/2021, 11:40 AM  Clinical Narrative:    Discharge orders noted RN will get ortho tech to apply and size crutches. Pam from [employer] notified of IV infusion ancef. Will get [facility] to provide RN for infusion. Will attempt to establish PT OT HH. Patient to be DC after [employer] does teaching this afternoon.   Expected Discharge Plan: Home/Self Care Barriers to Discharge: Continued Medical Work up,No [facility] will accept this patient Expected Discharge Plan and Services Expected Discharge Plan: Home/Self Care       Living arrangements for the past 2 months: Single Family Home Expected Discharge Date: 04/02/21                                     Social Determinants of Health (SDOH) Interventions    Readmission Risk Interventions Readmission Risk Prevention Plan 06/25/2020  Transportation Screening Complete  Medication Review [employer]) Complete  PCP or Specialist appointment within 3-5 days of discharge Complete  HRI or Home Care Consult Complete  SW Recovery Care/Counseling Consult Complete  Palliative Care Screening Complete  Skilled Nursing Facility Complete  Some recent data might be hidden

## 2021-04-02 NOTE — Evaluation (Signed)
Physical Therapy Evaluation Patient Details Name: Eugene Diaz MRN: [id number] DOB: [birth year] Today's Date: 04/02/2021   History of Present Illness  55 y.o. male, presents the ED 5/28 with a chief complaint of right great toe swelling. R foot x-ray reveals localized infection with underlying osteomyletis. s/p 5/30 drainage of R foot abcess and partial 1st ray resection. Returned to OR 6/2 for R foot wound debridement and closure.   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 Pt reports independence in household ambulation using crutches and with bathing and dressing. Pt's sister orders food online and has it delivered. Pt reports driving to appointments. Pt is currently limited in safe mobility by decreased safety awareness, decreased knowledge of DME use, R foot pain and decreased balance and endurance. Pt is currently min A for sit>stand transfers in RW however refuses to advance mobility due to "I don't feel safe with walker." Pt prefers to use crutches and self reports hanging on his axilla to provide balance. Educated on nerve damage with utilizing crutches in that way. Pt able to perform lateral scoot transfer to drop arm recliner on R. PT advised scoot transfers to his wheelchair and use of wheelchair for mobility until his foot heals. PT recommending crutches at discharge as he lost his somewhere in transport from [facility]. Pt will also benefit from HHPT to improve balance and transfers. PT will continue to follow acutely.     Follow Up Recommendations Home health PT;Supervision - Intermittent    Equipment Recommendations  Crutches    Recommendations for Other Services OT consult     Precautions / Restrictions Precautions Precautions:  Fall Restrictions Weight Bearing Restrictions: No RLE Weight Bearing: Weight bearing as tolerated      Mobility  Bed Mobility               General bed mobility comments: sitting EoB on entry    Transfers Overall transfer level: Needs assistance Equipment used: Rolling walker (2 wheeled);None Transfers: Sit to/from Stand;Lateral/Scoot Transfers Sit to Stand: Min assist        Lateral/Scoot Transfers: Supervision General transfer comment: min A for steadying with power up and steadying with RW, vc for weightbearing only through heel,despite maximal education and cuing pt refuses to take steps due "not feeling safe", supervision for lateral scoot to drop arm recliner on R  Ambulation/Gait             General Gait Details: defer until wound healing advances     Balance Overall balance assessment: Needs assistance Sitting-balance support: Feet supported;No upper extremity supported;Feet unsupported Sitting balance-Leahy Scale: Good     Standing balance support: Bilateral upper extremity supported Standing balance-Leahy Scale: Poor                               Pertinent Vitals/Pain Pain Assessment: Faces Faces Pain Scale: Hurts even more Pain Location: R foot Pain Descriptors / Indicators: Grimacing;Guarding Pain Intervention(s): Limited activity within patient's tolerance;Monitored during session;Repositioned    Home Living Family/patient expects to be discharged to:: Private residence Living Arrangements: Alone Available Help at Discharge: Family;Available PRN/intermittently Type of Home: Mobile home Home Access: Stairs to enter Entrance Stairs-Rails: None Entrance Stairs-Number of Steps: 1 Home Layout: One level Home  Equipment: Wheelchair - manual      Prior Function Level of Independence: Independent with assistive device(s)         Comments: used crutches at home     Hand Dominance   Dominant Hand: Left     Extremity/Trunk Assessment   Upper Extremity Assessment Upper Extremity Assessment: Defer to OT evaluation    Lower Extremity Assessment Lower Extremity Assessment: RLE deficits/detail;LLE deficits/detail RLE Deficits / Details: R great toe amputation, ankle ROM limited by swelling, ROM and strength in knee and hip WFL LLE Deficits / Details: L BKA, hip and knee ROM WFL, strength grossly 3+/5       Communication   Communication: No difficulties  Cognition Arousal/Alertness: Awake/alert Behavior During Therapy: WFL for tasks assessed/performed Overall Cognitive Status: Impaired/Different from baseline Area of Impairment: Safety/judgement;Awareness;Problem solving                         Safety/Judgement: Decreased awareness of safety;Decreased awareness of deficits Awareness: Emergent Problem Solving: Slow processing;Difficulty sequencing;Requires verbal cues;Requires tactile cues General Comments: pt with decreased safety awareness, despite training with RW and maximal education on nerve damage with hanging on crutches pt refuses to attempt RW usage for transfers      General Comments General comments (skin integrity, edema, etc.): R foot dressing is clean dry and intact, VSS on RA    Exercises General Exercises - Lower Extremity Ankle Circles/Pumps: AROM;Right;20 reps;Seated   Assessment/Plan    PT Assessment Patient needs continued PT services  PT Problem List Decreased activity tolerance;Decreased balance;Decreased mobility;Decreased safety awareness;Decreased knowledge of use of DME;Decreased cognition;Pain;Decreased skin integrity       PT Treatment Interventions DME instruction;Gait training;Functional mobility training;Therapeutic activities;Therapeutic exercise;Balance training;Cognitive remediation;Patient/family education    PT Goals (Current goals can be found in the Care Plan section)  Acute Rehab PT Goals Patient Stated Goal: stop drinking PT Goal  Formulation: With patient Time For Goal Achievement: 04/16/21 Potential to Achieve Goals: Fair    Frequency Min 3X/week    AM-PAC PT "6 Clicks" Mobility  Outcome Measure Help needed turning from your back to your side while in a flat bed without using bedrails?: None Help needed moving from lying on your back to sitting on the side of a flat bed without using bedrails?: None Help needed moving to and from a bed to a chair (including a wheelchair)?: A Little Help needed standing up from a chair using your arms (e.g., wheelchair or bedside chair)?: A Little Help needed to walk in hospital room?: A Lot Help needed climbing 3-5 steps with a railing? : Total 6 Click Score: 17    End of Session Equipment Utilized During Treatment: Gait belt Activity Tolerance: Patient tolerated treatment well Patient left: in chair;with call bell/phone within reach;with chair alarm set Nurse Communication: Mobility status PT Visit Diagnosis: Unsteadiness on feet (R26.81);Other abnormalities of gait and mobility (R26.89);Muscle weakness (generalized) (M62.81);Difficulty in walking, not elsewhere classified (R26.2);Pain Pain - Right/Left: Right Pain - part of body: Ankle and joints of foot    Time: 0911-0950 PT Time Calculation (min) (ACUTE ONLY): 39 min   Charges:   PT Evaluation $PT Eval Moderate Complexity: 1 Mod PT Treatments $Therapeutic Activity: 23-37 mins        Tmya Wigington B. Migdalia Dk PT, DPT Acute Rehabilitation Services Pager [phone] Office [phone]   [place] 04/02/2021, 10:24 AM

## 2021-04-03 LAB — CULTURE, BLOOD (ROUTINE X 2)
Culture: NO GROWTH
Culture: NO GROWTH
Special Requests: ADEQUATE
Special Requests: ADEQUATE

## 2021-04-03 LAB — ACID FAST SMEAR (AFB, MYCOBACTERIA): Acid Fast Smear: NEGATIVE

## 2021-04-04 LAB — AEROBIC/ANAEROBIC CULTURE W GRAM STAIN (SURGICAL/DEEP WOUND)
Culture: NO GROWTH
Gram Stain: NONE SEEN

## 2021-04-07 LAB — AEROBIC/ANAEROBIC CULTURE W GRAM STAIN (SURGICAL/DEEP WOUND)
Culture: NO GROWTH
Gram Stain: NONE SEEN

## 2021-04-09 ENCOUNTER — Other Ambulatory Visit: Payer: Self-pay

## 2021-04-09 ENCOUNTER — Ambulatory Visit (INDEPENDENT_AMBULATORY_CARE_PROVIDER_SITE_OTHER): Payer: Medicaid Other | Admitting: Podiatry

## 2021-04-09 DIAGNOSIS — L97514 Non-pressure chronic ulcer of other part of right foot with necrosis of bone: Secondary | ICD-10-CM

## 2021-04-09 NOTE — Progress Notes (Signed)
  Subjective:  Patient ID: Eugene Diaz, male    DOB: [birth year],  MRN: [id number]  No chief complaint on file.  DOS: 04/01/21 Procedures:             1) incision and drainage of right foot abscess, complex             2) partial first ray resection right  DOS: 03/29/21 Procedures:             1) Right foot wound debridement and closure             2) Bone biopsy  55 y.o. male presents with the above complaint. History confirmed with patient.   Objective:  Physical Exam: no tenderness at the surgical site, local edema noted, and calf supple, nontender. Incision: no dehiscence, no significant erythema, slow healing, slight clear drainage present  Assessment:   1. 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  Post-operative State -Dressing applied consisting of betadine, sterile gauze, kerlix, and ACE bandage -WBAT in CAM boot -04/01/21 wound culture negative -XRs needed at follow-up: none  Return in about 1 week (around 04/16/2021) for Post-Op (No XRs).

## 2021-04-14 ENCOUNTER — Telehealth: Payer: Self-pay

## 2021-04-14 ENCOUNTER — Ambulatory Visit: Payer: Medicaid Other | Admitting: Infectious Disease

## 2021-04-14 NOTE — Telephone Encounter (Signed)
Tried to contact patient to find out if he was going to make his appointment. Sister was who answered the call, she stated he wasn't home. RN requested that they call back to reschedule appointment. Provided office number to reschedule.   Prescious Hurless Lorita Officer, RN

## 2021-04-15 ENCOUNTER — Telehealth: Payer: Self-pay

## 2021-04-15 ENCOUNTER — Ambulatory Visit: Payer: Medicaid Other | Admitting: Infectious Disease

## 2021-04-15 NOTE — Telephone Encounter (Signed)
Called patient to get appointment rescheduled for next week, no voicemail set up

## 2021-04-16 ENCOUNTER — Ambulatory Visit: Payer: Medicaid Other | Admitting: Podiatry

## 2021-04-19 ENCOUNTER — Telehealth: Payer: Self-pay

## 2021-04-19 NOTE — Telephone Encounter (Signed)
Called patient to reschedule Hospital follow, patients Mother will call back to reschedule this appointment.

## 2021-04-19 NOTE — Telephone Encounter (Signed)
-----   Message from Beryle Flock, RN sent at 04/15/2021  9:34 AM EDT ----- Regarding: reschedule This patient won't be able to make it today. Could you reach out and schedule him with any provider sometime next week as a HSFU sometime 2pm or after?   Thank you!! Jinny Blossom

## 2021-04-30 ENCOUNTER — Telehealth: Payer: Self-pay | Admitting: Podiatry

## 2021-04-30 LAB — FUNGAL ORGANISM REFLEX

## 2021-04-30 LAB — FUNGUS CULTURE WITH STAIN

## 2021-04-30 LAB — FUNGUS CULTURE RESULT

## 2021-04-30 MED ORDER — SILVER SULFADIAZINE 1 % EX CREA: TOPICAL_CREAM | CUTANEOUS | 0 refills | Status: DC

## 2021-04-30 NOTE — Telephone Encounter (Signed)
Patient is requesting antibiotic cream for foot. Please advise

## 2021-04-30 NOTE — Addendum Note (Signed)
Addended by: Hardie Pulley on: 04/30/2021 05:33 PM   Modules accepted: Orders

## 2021-05-04 ENCOUNTER — Telehealth: Payer: Self-pay | Admitting: *Deleted

## 2021-05-04 ENCOUNTER — Ambulatory Visit: Payer: Medicaid Other | Admitting: Podiatry

## 2021-05-04 NOTE — Telephone Encounter (Signed)
Received call from [facility], Tourist information centre manager at [facility].  She got a report from patient's homehealth nurse that there are 30 syringes of ancef in the patients refrigerator (last shipment 04/30/21, 21 doses per [facility]). Per nurse, the patient's sister is the one administering the medication and "hasn't done some of the night time doses."  Nurse also reports PICC is out a little, <4cm. Flushed well and with good blood draw back for labs today. Patient is scheduled to follow up with Dr Tommy Medal 05/05/21 3:00. Landis Gandy, RN

## 2021-05-05 ENCOUNTER — Other Ambulatory Visit: Payer: Self-pay

## 2021-05-05 ENCOUNTER — Ambulatory Visit (INDEPENDENT_AMBULATORY_CARE_PROVIDER_SITE_OTHER): Payer: Medicaid Other | Admitting: Infectious Disease

## 2021-05-05 VITALS — BP 180/79 | HR 72 | Resp 16 | Ht 74.0 in | Wt 290.0 lb

## 2021-05-05 DIAGNOSIS — E1152 Type 2 diabetes mellitus with diabetic peripheral angiopathy with gangrene: Secondary | ICD-10-CM | POA: Diagnosis not present

## 2021-05-05 DIAGNOSIS — M869 Osteomyelitis, unspecified: Secondary | ICD-10-CM

## 2021-05-05 DIAGNOSIS — E1141 Type 2 diabetes mellitus with diabetic mononeuropathy: Secondary | ICD-10-CM

## 2021-05-05 DIAGNOSIS — M86272 Subacute osteomyelitis, left ankle and foot: Secondary | ICD-10-CM

## 2021-05-05 DIAGNOSIS — D696 Thrombocytopenia, unspecified: Secondary | ICD-10-CM

## 2021-05-05 DIAGNOSIS — F1010 Alcohol abuse, uncomplicated: Secondary | ICD-10-CM

## 2021-05-05 DIAGNOSIS — S88112A Complete traumatic amputation at level between knee and ankle, left lower leg, initial encounter: Secondary | ICD-10-CM | POA: Diagnosis not present

## 2021-05-05 DIAGNOSIS — K760 Fatty (change of) liver, not elsewhere classified: Secondary | ICD-10-CM

## 2021-05-05 DIAGNOSIS — G621 Alcoholic polyneuropathy: Secondary | ICD-10-CM | POA: Diagnosis not present

## 2021-05-05 DIAGNOSIS — E1169 Type 2 diabetes mellitus with other specified complication: Secondary | ICD-10-CM | POA: Diagnosis not present

## 2021-05-05 MED ORDER — AMOXICILLIN-POT CLAVULANATE 875-125 MG PO TABS: 1.0000 | ORAL_TABLET | Freq: Two times a day (BID) | ORAL | 3 refills | Status: DC

## 2021-05-05 NOTE — Progress Notes (Signed)
Subjective:  Chief complaint: followup for osteomyelitis   Patient ID: Eugene Diaz, male    DOB: 1966-03-21, 55 y.o.   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  He had additional further debridement as well with bone biopsy of the first metatarsal and wound closure.  He grew E. Coli group B streptococcus as well as beta-lactamase producing Prevotella.  He was narrowed to intravenous cefazolin with oral metronidazole.  He was counseled not to drink alcohol while on the metronidazole and he claims he is not taking the metronidazole.  We had report from his home health company that the cefazolin which his sister's been giving him is only been being given twice daily.  The PICC line was also coming out according to home health aides.  He was scheduled for visit today and came to see me.  He came without any protective bandages on his feet nor socks or shoes.  He says he did this because he did not have time to put anything on his foot before his friend could bring him to the clinic.  He still has some drainage from the wound which I took a picture of today he does not have much in the way of pain though he does have peripheral neuropathy.  He has seen podiatry in early June and scheduled to see Dr [name] in a few weeks time.    Past Medical History:  Diagnosis Date   Alcohol abuse    Alcoholic peripheral neuropathy ([place])    Anxiety    Arthritis    Asthma    followed by pcp   B12 deficiency    Depression    Diabetic neuropathy ([place])    Edema of both lower extremities    Essential tremor     neurologist-- dr patel--- due to alcohol abuse   GERD (gastroesophageal reflux disease)    History of cellulitis 2020   left lower leg;   recurrent 03-25-2020   History of esophageal stricture    s/p  dilatation 02-13-2018   History of osteomyelitis 2020   left great toe     Hypercholesterolemia    Hypertension    followed by pcp  (nuclear stress test 03-11-2014 low risk w/ no ischemia, ef 65%   Normocytic anemia    followed by pcp   (03-18-2020 had transfusion's 02/ 2021)   Peripheral vascular disease ([place])    Status post incision and drainage followed by Dr [name] and pcp   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   (03-17-2020  pt states most of incision is healed with exception an area that is still draining,  daily dressing change),  foot is red but not warm to the touch and has swelling but has improved)   Thrombocytopenia ([place])    chronic   Type 2 diabetes mellitus ([place])    followed by pcp---    (03-18-2020  pt stated does not check blood sugar)    Past Surgical History:  Procedure Laterality Date   AMPUTATION Left 10/16/2019   Procedure: Left partial second ray resection; placement of antibiotic beads;  Surgeon: Evelina Bucy, DPM;  Location: [facility];  Service: Podiatry;  Laterality: Left;   AMPUTATION Left 11/13/2019   Procedure: Left Midfoot Amputation - Transmetatarsal vs. Lisfranc; Placement antibiotic beads;  Surgeon: Evelina Bucy, DPM;  [facility]: [facility];  Service: Podiatry;  Laterality: Left;   AMPUTATION Left 12/21/2019   Procedure: Chopart Amputation left foot;  Surgeon: Evelina Bucy, DPM;  Location: [place];  Service: Podiatry;  Laterality: Left;   AMPUTATION Left 06/24/2020   Procedure: LEFT BELOW KNEE AMPUTATION;  Surgeon: Newt Minion, MD;  Location: [place] [facility];  Service: Orthopedics;  Laterality: Left;   AMPUTATION Right 03/29/2021   Procedure: AMPUTATION RAY;  Surgeon: Evelina Bucy, DPM;  Location: [place];  Service: Podiatry;  Laterality: Right;    AMPUTATION TOE Left 08/14/2019   Procedure: AMPUTATION GREAT TOE;  Surgeon: Evelina Bucy, DPM;  [facility];  Service: Podiatry;  Laterality: Left;   APPLICATION OF WOUND VAC Left 12/21/2019   Procedure: Application Of Wound Vac;  Surgeon: Evelina Bucy, DPM;  Location: [place];  Service: Podiatry;  Laterality: Left;   BIOPSY  02/13/2018   Procedure: BIOPSY;  Surgeon: Danie Binder, MD;  [facility];  Service: Endoscopy;;  transverse colon biopsy, gastric biopsy   COLONOSCOPY WITH PROPOFOL N/A 02/13/2018   Procedure: COLONOSCOPY WITH PROPOFOL;  Surgeon: Danie Binder, MD;  [facility];  Service: Endoscopy;  Laterality: N/A;  11:15am   ESOPHAGOGASTRODUODENOSCOPY (EGD) WITH PROPOFOL N/A 02/13/2018   Procedure: ESOPHAGOGASTRODUODENOSCOPY (EGD) WITH PROPOFOL;  Surgeon: Danie Binder, MD;  [facility];  Service: Endoscopy;  Laterality: N/A;   I & D EXTREMITY Left 07/16/2019   Procedure: Insicion  AND DEBRIDEMENT EXTREMITY;  Surgeon: Evelina Bucy, DPM;  Location: [facility];  Service: Podiatry;  Laterality: Left;   I & D EXTREMITY Left 06/22/2020   Procedure: IRRIGATION AND DEBRIDEMENT EXTREMITY, left foot and ankle;  Surgeon: Evelina Bucy, DPM;  Location: [place];  Service: Podiatry;  Laterality: Left;   I & D EXTREMITY Right 03/29/2021   Procedure: IRRIGATION AND DEBRIDEMENT EXTREMITY;  Surgeon: Evelina Bucy, DPM;  Location: [facility];  Service: Podiatry;  Laterality: Right;   INCISION AND DRAINAGE OF WOUND Left 10/13/2019   Procedure: IRRIGATION AND DEBRIDEMENT WOUND OF LEFT FOOT AND FIRST METATARSAL RESECTION;  Surgeon: Evelina Bucy, DPM;  [facility];  Service: Podiatry;  Laterality: Left;   IRRIGATION AND DEBRIDEMENT FOOT Left 11/11/2019   Procedure: Left Foot Wound Irrigation and Debridement;  Surgeon: Evelina Bucy, DPM;  [facility];  Service: Podiatry;  Laterality: Left;   Left arm     Left arm repair (tendon and artery)    PERCUTANEOUS PINNING  07/16/2019   Procedure: Open Reduction Percutaneous Pinning Extremity;  Surgeon: Evelina Bucy, DPM;  Location: [facility];  Service: Podiatry;;   PILONIDAL CYST EXCISION N/A 08/08/2014   Procedure: CYST EXCISION PILONIDAL EXTENSIVE;  Surgeon: Jamesetta So, MD;  [facility];  Service: General;  Laterality: N/A;   POLYPECTOMY  02/13/2018   Procedure: POLYPECTOMY;  Surgeon: Danie Binder, MD;  [facility];  Service: Endoscopy;;  transverse colon polyp hs, rectal polyps times 2   SAVORY DILATION N/A 02/13/2018   Procedure: SAVORY DILATION;  Surgeon: Danie Binder, MD;  [facility];  Service: Endoscopy;  Laterality: N/A;   WOUND DEBRIDEMENT Left 09/04/2019   Procedure: DEBRIDEMENT WOUND WITH COMPLEX  REPAIR OF DEHISCENCE;  Surgeon: Evelina Bucy,  DPM;  Location: [place];  Service: Podiatry;  Laterality: Left;  Leave patient on stretcher   WOUND DEBRIDEMENT Left 10/16/2019   Procedure: Left foot wound debridement and closure;  Surgeon: Evelina Bucy, DPM;  [facility];  Service: Podiatry;  Laterality: Left;   WOUND DEBRIDEMENT Left 12/18/2019   Procedure: LEFT FOOT DEBRIDEMENT WITH PARTIAL INCISION OF INFECTED BONE;  Surgeon: Evelina Bucy, DPM;  Location: [place];  Service: Podiatry;  Laterality: Left;  LEFT FOOT DEBRIDEMENT WITH PARTIAL INCISION OF INFECTED BONE   WOUND DEBRIDEMENT Right 04/01/2021   Procedure: Right foot wound debridement and irrigation and closure, bone biopsy;  Surgeon: Evelina Bucy, DPM;  Location: [facility];  Service: Podiatry;  Laterality: Right;    Family History  Problem Relation Age of Onset   Heart attack Mother        Living, 61   Cancer Father        Deceased   Healthy Brother    Healthy Sister    Colon cancer Neg Hx    Colon polyps Neg Hx       Social History   Socioeconomic History   Marital status: Widowed    Spouse name: Not on file   Number of children: 0   Years of  education: Not on file   Highest education level: 8th grade  Occupational History   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  Tobacco Use   Smoking status: Former    Packs/day: 2.50    Years: 25.00    Pack years: 62.50    Types: Cigarettes    Quit date: 08/05/1984    Years since quitting: 36.7   Smokeless tobacco: Never  Vaping Use   Vaping Use: Never used  Substance and Sexual Activity   Alcohol use: Yes    Alcohol/week: 50.0 standard drinks    Types: 50 Standard drinks or equivalent per week    Comment: Drinks 6 beers, liquor 4-6oz (both daily) x 35 years   Drug use: Not Currently    Types: Marijuana   Sexual activity: Yes    Birth control/protection: None  Other Topics Concern   Not on file  Social History Narrative   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 Highest level of education:  8th grade   He lives with wife.  No children.   Left handed   Drinks soda, water-no coffee, no tea   Social Determinants of Health   Financial Resource Strain: Not on file  Food Insecurity: Not on file  Transportation Needs: Not on file  Physical Activity: Not on file  Stress: Not on file  Social Connections: Not on file    No Known Allergies   Current Outpatient Medications:    amoxicillin-clavulanate (AUGMENTIN) 875-125 MG tablet, Take 1 tablet by mouth 2 (two) times daily., Disp: 60 tablet, Rfl: 3   acetaminophen (TYLENOL) 325 MG tablet, Take 2 tablets (650 mg total) by mouth every 6 (six) hours as needed for mild pain (or Fever >/= 101). (Patient not taking: Reported on 05/05/2021), Disp: 20 tablet, Rfl: 0   ALPRAZolam (XANAX) 1 MG tablet, Take 0.5 tablets (0.5 mg total) by mouth 4 (four) times daily as needed for anxiety. (Patient not taking: No sig reported), Disp: 12 tablet, Rfl: 0   amLODipine (NORVASC) 10 MG tablet, Take 1 tablet (10 mg total) by mouth daily. (Patient not taking: Reported on 05/05/2021),  Disp: 90 tablet,  Rfl: 1   aspirin EC 81 MG EC tablet, Take 1 tablet (81 mg total) by mouth daily. Swallow whole. (Patient not taking: Reported on 05/05/2021), Disp: 30 tablet, Rfl: 11   atorvastatin (LIPITOR) 40 MG tablet, Take 40 mg by mouth every evening.  (Patient not taking: No sig reported), Disp: , Rfl: 10   diclofenac Sodium (VOLTAREN) 1 % GEL, Apply 1 application topically daily as needed (pain). (Patient not taking: Reported on 05/05/2021), Disp: , Rfl:    ferrous sulfate 325 (65 FE) MG tablet, Take 1 tablet (325 mg total) by mouth daily. (Patient not taking: Reported on 03/27/2021), Disp: 30 tablet, Rfl: 1   folic acid (FOLVITE) 1 MG tablet, Take 1 tablet (1 mg total) by mouth daily. (Patient not taking: No sig reported), Disp: 30 tablet, Rfl: 0   gabapentin (NEURONTIN) 300 MG capsule, TAKE 3 CAPSULES BY MOUTH IN THE MORNING, 3 CAPSULES AT NOON, AND 4 CAPSULES AT BEDTIME FOR NERVE PAIN. (Patient not taking: No sig reported), Disp: 300 capsule, Rfl: 1   glipiZIDE (GLUCOTROL) 5 MG tablet, Take 1 tablet (5 mg total) by mouth 2 (two) times daily. (Patient not taking: Reported on 05/05/2021), Disp: 180 tablet, Rfl: 1   hydrALAZINE (APRESOLINE) 50 MG tablet, Take 1 tablet (50 mg total) by mouth every 8 (eight) hours. (Patient not taking: No sig reported), Disp: 90 tablet, Rfl: 0   lidocaine (XYLOCAINE) 5 % ointment, Apply topically 2 (two) times daily as needed., Disp: , Rfl:    lisinopril (ZESTRIL) 40 MG tablet, Take 1 tablet (40 mg total) by mouth at bedtime. (Patient not taking: No sig reported), Disp: 30 tablet, Rfl: 0   metFORMIN (GLUCOPHAGE) 500 MG tablet, Take 1 tablet (500 mg total) by mouth 2 (two) times daily with a meal. (Patient not taking: Reported on 05/05/2021), Disp: 180 tablet, Rfl: 1   Multiple Vitamin (MULTIVITAMIN WITH MINERALS) TABS tablet, Take 1 tablet by mouth daily. (Patient not taking: No sig reported), Disp: , Rfl:    silver sulfADIAZINE (SILVADENE) 1 % cream, Apply pea-sized  amount to wound daily. (Patient not taking: Reported on 05/05/2021), Disp: 50 g, Rfl: 0   thiamine 100 MG tablet, Take 1 tablet (100 mg total) by mouth daily. (Patient not taking: No sig reported), Disp: 30 tablet, Rfl: 0   Review of Systems  Constitutional:  Negative for chills and fever.  HENT:  Negative for congestion, drooling, ear discharge, ear pain, facial swelling, rhinorrhea and sore throat.   Eyes:  Negative for photophobia.  Respiratory:  Negative for cough, shortness of breath and wheezing.   Cardiovascular:  Negative for chest pain, palpitations and leg swelling.  Gastrointestinal:  Negative for abdominal pain, blood in stool, constipation, diarrhea, nausea and vomiting.  Genitourinary:  Negative for dysuria, flank pain and hematuria.  Musculoskeletal:  Negative for back pain and myalgias.  Skin:  Positive for wound. Negative for rash.  Neurological:  Negative for dizziness, weakness and headaches.  Hematological:  Does not bruise/bleed easily.  Psychiatric/Behavioral:  Negative for agitation, behavioral problems, confusion, decreased concentration, dysphoric mood, sleep disturbance and suicidal ideas.       Objective:   Physical Exam Constitutional:      Appearance: He is well-developed.  HENT:     Head: Normocephalic and atraumatic.  Eyes:     Extraocular Movements: Extraocular movements intact.     Conjunctiva/sclera: Conjunctivae normal.  Cardiovascular:     Rate and Rhythm: Normal rate and regular rhythm.  Pulmonary:  Effort: Pulmonary effort is normal. No respiratory distress.     Breath sounds: No wheezing.  Abdominal:     General: There is no distension.     Palpations: Abdomen is soft.  Musculoskeletal:        General: Normal range of motion.     Cervical back: Normal range of motion and neck supple.  Skin:    General: Skin is warm.     Coloration: Skin is not pale.     Findings: No erythema or rash.  Neurological:     General: No focal deficit  present.     Mental Status: He is alert and oriented to person, place, and time.  Psychiatric:        Mood and Affect: Mood normal.        Behavior: Behavior normal.        Thought Content: Thought content normal.        Judgment: Judgment normal.    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  We have remove the PICC line given that I do not think he is doing the best job of having IV antibiotics administered at home.  I will place him Augmentin which should cover these microbes that he had isolated previously and I given him a month supply with a refill.  We will see him in follow-up and he should be seeing podiatry in the interim as well.  Is foot was also dressed with a bandage by our nursing staff.  Diabetes mellitus has not been well controlled.  Alcoholism he claims to be not drinking alcohol at all and does appear sober during this visit.  Hypertension poorly controlled.  I spent more than 40 minutes with the patient including face to face counseling of the patient personally reviewing radiographs, along with pertinent laboratory microbiological data review of medical records and in coordination of his care.

## 2021-05-06 NOTE — Progress Notes (Addendum)
Per verbal order from Dr Tommy Medal, 41 cm Single Lumen Peripherally Inserted Central Catheter removed from right arm, tip intact. No sutures present. RN confirmed length per chart. Dressing was clean and dry. Petroleum dressing applied. Pt advised no heavy lifting with this arm, leave dressing for 24 hours and call the office or seek emergent care if dressing becomes soaked with blood, swelling, or sharp pain presents. Patient was dozing off during visit, but his friend Eugene Diaz verbalized understanding and agreement.  Patient's questions answered to their satisfaction. Patient tolerated procedure well, RN walked patient to check out. Pharmacy notified. Patient came to clinic with a bare foot and his wound uncovered. RN applied dressing, asked patient and his friend Eugene Diaz to have patient's sister Eugene Diaz come same day and cleanse the wound, replace dressing with a new dressing per his podiatrist's instructions.  Landis Gandy, RN

## 2021-05-07 ENCOUNTER — Telehealth: Payer: Self-pay

## 2021-05-07 NOTE — Telephone Encounter (Signed)
Received call from Many with [facility] wanting to make sure patient attended yesterday's appointment. Advised her that patient came and PICC was removed and he was started on oral antibiotics.   Beryle Flock, RN

## 2021-05-11 ENCOUNTER — Ambulatory Visit (INDEPENDENT_AMBULATORY_CARE_PROVIDER_SITE_OTHER): Payer: Medicaid Other | Admitting: Podiatry

## 2021-05-11 ENCOUNTER — Other Ambulatory Visit: Payer: Self-pay

## 2021-05-11 DIAGNOSIS — L97514 Non-pressure chronic ulcer of other part of right foot with necrosis of bone: Secondary | ICD-10-CM

## 2021-05-11 NOTE — Progress Notes (Signed)
  Subjective:  Patient ID: Eugene Diaz, male    DOB: 1966-04-28,  MRN: [id number]  Chief Complaint  Patient presents with   Wound Check    Right foot wound recheck. Pt states he thinks there may be some improvement he is just experiencing a lot of drainage.     DOS: 04/01/21 Procedures:             1) incision and drainage of right foot abscess, complex             2) partial first ray resection right  DOS: 03/29/21 Procedures:             1) Right foot wound debridement and closure             2) Bone biopsy  55 y.o. male presents with the above complaint. History confirmed with patient. Presents today barefoot without wearing any shoe on his foot.  Objective:  Physical Exam: no tenderness at the surgical site, local edema noted, and calf supple, nontender. Incision: dehiscence measuring 2.5*2; fibrogranular base, mod ss drainage. No warmth, no erythema, no SOI.  Assessment:   1. 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  Post-operative State -Sutures and staples removed. -Wound cleansed. No debridement today. -Dressing applied consisting of saline wet to dry with sterile gauze, kerlix, and ACE bandage. -Offered HHC. Patient declined. -WBAT in CAM boot. Discussed the importance of wearing his boot to prevent further wound healing issues.  Return in about 2 weeks (around 05/25/2021) for Wound Care.

## 2021-05-17 LAB — ACID FAST CULTURE WITH REFLEXED SENSITIVITIES (MYCOBACTERIA): Acid Fast Culture: NEGATIVE

## 2021-05-25 ENCOUNTER — Other Ambulatory Visit: Payer: Self-pay

## 2021-05-25 ENCOUNTER — Ambulatory Visit (INDEPENDENT_AMBULATORY_CARE_PROVIDER_SITE_OTHER): Payer: Medicaid Other | Admitting: Podiatry

## 2021-05-25 VITALS — Temp 98.3°F

## 2021-05-25 DIAGNOSIS — Z9889 Other specified postprocedural states: Secondary | ICD-10-CM

## 2021-05-25 DIAGNOSIS — L97514 Non-pressure chronic ulcer of other part of right foot with necrosis of bone: Secondary | ICD-10-CM

## 2021-05-25 DIAGNOSIS — L97512 Non-pressure chronic ulcer of other part of right foot with fat layer exposed: Secondary | ICD-10-CM

## 2021-05-28 NOTE — Progress Notes (Signed)
  Subjective:  Patient ID: Eugene Diaz, male    DOB: 12-11-1965,  MRN: [id number]  Chief Complaint  Patient presents with   Foot Ulcer    Patient denies nausea, vomiting, fever, odor and chills. Drainage is noted to the old dressing. Patient did not voice any concerns today.     DOS: 04/01/21 Procedures:             1) incision and drainage of right foot abscess, complex             2) partial first ray resection right  DOS: 03/29/21 Procedures:             1) Right foot wound debridement and closure             2) Bone biopsy  55 y.o. male presents with the above complaint. History confirmed with patient. Wearing his surgical shoe today.   Objective:  Physical Exam: no tenderness at the surgical site, local edema noted, and calf supple, nontender. Incision: dehiscence measuring 2.5x1.5; fibrogranular base, mod ss drainage. No warmth, no erythema, no SOI. Wound is rather dirty with hair and debris in the wound before cleansing/debridement.  Assessment:   1. 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 Ulcer of right foot with fat layer exposed ([place])   3. Post-operative state     Plan:  Patient was evaluated and treated and all questions answered.  Post-operative State -Wound cleansed and debrided -Dressing applied consisting of saline WTD -Offload ulcer with surgical shoe -Refer to wound care center for assistance in healing this wound.  Procedure: Selective Debridement of Wound Rationale: Removal of devitalized tissue from the wound to promote healing.  Pre-Debridement Wound Measurements: 2.5 cm x 1.5 cm x 0.3 cm  Post-Debridement Wound Measurements: same as pre-debridement. Type of Debridement: sharp selective Instrumentation: dermal curette, tissue nipper Tissue Removed: Devitalized soft-tissue Dressing: Dry, sterile, compression dressing. Disposition: Patient tolerated procedure well.   No follow-ups on file.

## 2021-06-08 ENCOUNTER — Telehealth: Payer: Self-pay | Admitting: Podiatry

## 2021-06-08 NOTE — Telephone Encounter (Signed)
[facility] provider called and wanted to know why the patient was referred there when he has Bone Erosion and confirmed osteo because they don't have HBO. Please call asap

## 2021-06-08 NOTE — Telephone Encounter (Signed)
Attempted to call back wound center. No answer; VM was left. Will reattempt

## 2021-06-09 ENCOUNTER — Encounter: Payer: Self-pay | Admitting: Infectious Disease

## 2021-06-09 ENCOUNTER — Other Ambulatory Visit: Payer: Self-pay

## 2021-06-09 ENCOUNTER — Ambulatory Visit (INDEPENDENT_AMBULATORY_CARE_PROVIDER_SITE_OTHER): Payer: Medicare Other | Admitting: Infectious Disease

## 2021-06-09 VITALS — BP 184/99 | HR 61 | Temp 98.1°F | Resp 16 | Ht 74.0 in | Wt 290.0 lb

## 2021-06-09 DIAGNOSIS — E1169 Type 2 diabetes mellitus with other specified complication: Secondary | ICD-10-CM

## 2021-06-09 DIAGNOSIS — F418 Other specified anxiety disorders: Secondary | ICD-10-CM

## 2021-06-09 DIAGNOSIS — G621 Alcoholic polyneuropathy: Secondary | ICD-10-CM | POA: Diagnosis not present

## 2021-06-09 DIAGNOSIS — M86272 Subacute osteomyelitis, left ankle and foot: Secondary | ICD-10-CM

## 2021-06-09 DIAGNOSIS — F322 Major depressive disorder, single episode, severe without psychotic features: Secondary | ICD-10-CM

## 2021-06-09 DIAGNOSIS — M869 Osteomyelitis, unspecified: Secondary | ICD-10-CM | POA: Diagnosis not present

## 2021-06-09 DIAGNOSIS — R45851 Suicidal ideations: Secondary | ICD-10-CM

## 2021-06-09 DIAGNOSIS — S88112A Complete traumatic amputation at level between knee and ankle, left lower leg, initial encounter: Secondary | ICD-10-CM | POA: Diagnosis not present

## 2021-06-09 HISTORY — DX: Suicidal ideations: R45.851

## 2021-06-09 HISTORY — DX: Major depressive disorder, single episode, severe without psychotic features: F32.2

## 2021-06-09 MED ORDER — AMOXICILLIN-POT CLAVULANATE 875-125 MG PO TABS: 1.0000 | ORAL_TABLET | Freq: Two times a day (BID) | ORAL | 3 refills | Status: DC

## 2021-06-09 NOTE — Progress Notes (Signed)
Subjective:  Chief complaint: I do not like taking pills I feel like giving up and he is severely depressed   Patient ID: Eugene Diaz, male    DOB: [birth year], 55 y.o.   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  He had additional further debridement as well with bone biopsy of the first metatarsal and wound closure.  He grew E. Coli group B streptococcus as well as beta-lactamase producing Prevotella.  He was narrowed to intravenous cefazolin with oral metronidazole.  He was counseled not to drink alcohol while on the metronidazole and he claims he is not taking the metronidazole.  We had report from his home health company that the cefazolin which his sister's been giving him is only been being given twice daily.   He was scheduled for visit today and came to see me last visit.   He came without any protective bandages on his feet nor socks or shoes.  He said he did this because he did not have time to put anything on his foot before his friend could bring him to the clinic.  He still has some drainage from the wound when I saw him.  We had discontinued his IV therapy and I placed him on Augmentin.  Since then he has seen [name] twice.  Notably last time but a prior saw him when he came in he was wearing a a boot but the wound was contaminated hair and debris that was found by M on exam.  He had performed debridement in the office.  The patient apparently had stopped taking his Augmentin and is not taking any of his oral medications whatsoever.  He was telling us that he does not like  taking pills.  It turns out though that it is not an issue of him not being able to take pills in terms of swallowing them but that he is suffering from severe depression.  He was accompanied by his friend and he and his friend recounted how he has "never been himself since he lost his wife 3 years ago.  In addition to severe depression related to the loss of his wife he did suffer from severe alcoholism which caused his neuropathy.  He has now stopped drinking.  He does still have neuropathy and he already required a below the knee amputation on the left side which also worsened his depression.  He apparently does not like to have much help from others but clearly needs help from his sister and also his friend came to accompany him to clinic today.  He is clearly profoundly depressed and has at times passive suicidal ideation but says he does not believe in the idea of committing suicide.        Past Medical History:  Diagnosis Date   Alcohol abuse    Alcoholic peripheral neuropathy (HCC)    Anxiety    Arthritis    Asthma    followed by pcp   B12 deficiency    Depression    Diabetic neuropathy ([place])    Edema of both lower extremities    Essential tremor    neurologist--  dr patel--- due to alcohol abuse   GERD (gastroesophageal reflux disease)    History of cellulitis 2020   left lower leg;   recurrent 03-25-2020   History of esophageal stricture    s/p  dilatation 02-13-2018   History of osteomyelitis 2020   left great toe     Hypercholesterolemia    Hypertension    followed by pcp  (nuclear stress test 03-11-2014 low risk w/ no ischemia, ef 65%   Normocytic anemia    followed by pcp   (03-18-2020 had transfusion's 02/ 2021)   Peripheral vascular disease ([place])    Status post incision and drainage followed by Dr [name] and pcp   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   (03-17-2020  pt states most of incision is healed with exception an area that is still draining,   daily dressing change),  foot is red but not warm to the touch and has swelling but has improved)   Thrombocytopenia ([place])    chronic   Type 2 diabetes mellitus ([place])    followed by pcp---    (03-18-2020  pt stated does not check blood sugar)    Past Surgical History:  Procedure Laterality Date   AMPUTATION Left 10/16/2019   Procedure: Left partial second ray resection; placement of antibiotic beads;  Surgeon: Evelina Bucy, DPM;  Location: [facility];  Service: Podiatry;  Laterality: Left;   AMPUTATION Left 11/13/2019   Procedure: Left Midfoot Amputation - Transmetatarsal vs. Lisfranc; Placement antibiotic beads;  Surgeon: Evelina Bucy, DPM;  Location: [facility];  Service: Podiatry;  Laterality: Left;   AMPUTATION Left 12/21/2019   Procedure: Chopart Amputation left foot;  Surgeon: Evelina Bucy, DPM;  Location: [place];  Service: Podiatry;  Laterality: Left;   AMPUTATION Left 06/24/2020   Procedure: LEFT BELOW KNEE AMPUTATION;  Surgeon: Newt Minion, MD;  Location: [place];  Service: Orthopedics;  Laterality: Left;   AMPUTATION Right 03/29/2021   Procedure: AMPUTATION RAY;  Surgeon: Evelina Bucy, DPM;  Location: [place];  Service: Podiatry;  Laterality: Right;   AMPUTATION TOE Left 08/14/2019   Procedure: AMPUTATION GREAT TOE;  Surgeon: Evelina Bucy, DPM;  [facility];  Service: Podiatry;  Laterality: Left;   APPLICATION OF WOUND VAC Left 12/21/2019   Procedure: Application Of Wound Vac;  Surgeon: Evelina Bucy, DPM;  Location: [place];  Service: Podiatry;  Laterality: Left;   BIOPSY  02/13/2018   Procedure: BIOPSY;  Surgeon: Danie Binder, MD;  [facility];  Service: Endoscopy;;  transverse colon biopsy, gastric biopsy   COLONOSCOPY WITH PROPOFOL N/A 02/13/2018   Procedure: COLONOSCOPY WITH PROPOFOL;  Surgeon: Danie Binder, MD;  [facility];  Service: Endoscopy;  Laterality: N/A;  11:15am   ESOPHAGOGASTRODUODENOSCOPY (EGD) WITH PROPOFOL N/A 02/13/2018    Procedure: ESOPHAGOGASTRODUODENOSCOPY (EGD) WITH PROPOFOL;  Surgeon: Danie Binder, MD;  [facility];  Service: Endoscopy;  Laterality: N/A;   I & D EXTREMITY Left 07/16/2019   Procedure: Insicion  AND DEBRIDEMENT EXTREMITY;  Surgeon: Evelina Bucy, DPM;  Location: [place];  Service: Podiatry;  Laterality: Left;   I & D EXTREMITY Left 06/22/2020   Procedure: IRRIGATION AND DEBRIDEMENT EXTREMITY, left foot and ankle;  Surgeon: Evelina Bucy, DPM;  Location: [place];  Service: Podiatry;  Laterality: Left;   I & D EXTREMITY Right 03/29/2021   Procedure: IRRIGATION AND DEBRIDEMENT EXTREMITY;  Surgeon: Evelina Bucy, DPM;  Location: [facility];  Service: Podiatry;  Laterality: Right;   INCISION AND DRAINAGE OF WOUND Left 10/13/2019   Procedure: IRRIGATION AND DEBRIDEMENT WOUND OF LEFT FOOT AND FIRST METATARSAL RESECTION;  Surgeon: Evelina Bucy, DPM;  [facility];  Service: Podiatry;  Laterality: Left;   IRRIGATION AND DEBRIDEMENT FOOT Left 11/11/2019   Procedure: Left Foot Wound Irrigation and Debridement;  Surgeon: Evelina Bucy, DPM;  [facility];  Service: Podiatry;  Laterality: Left;   Left arm     Left arm repair (tendon and artery)   PERCUTANEOUS PINNING  07/16/2019   Procedure: Open Reduction Percutaneous Pinning Extremity;  Surgeon: Evelina Bucy, DPM;  Location: [place];  Service: Podiatry;;   PILONIDAL CYST EXCISION N/A 08/08/2014   Procedure: CYST EXCISION PILONIDAL EXTENSIVE;  Surgeon: Jamesetta So, MD;  [facility];  Service: General;  Laterality: N/A;   POLYPECTOMY  02/13/2018   Procedure: POLYPECTOMY;  Surgeon: Danie Binder, MD;  [facility];  Service: Endoscopy;;  transverse colon polyp hs, rectal polyps times 2   SAVORY DILATION N/A 02/13/2018   Procedure: SAVORY DILATION;  Surgeon: Danie Binder, MD;  [facility];  Service: Endoscopy;  Laterality: N/A;   WOUND DEBRIDEMENT Left 09/04/2019   Procedure: DEBRIDEMENT WOUND  WITH COMPLEX  REPAIR OF DEHISCENCE;  Surgeon: Evelina Bucy, DPM;  Location: [place];  Service: Podiatry;  Laterality: Left;  Leave patient on stretcher   WOUND DEBRIDEMENT Left 10/16/2019   Procedure: Left foot wound debridement and closure;  Surgeon: Evelina Bucy, DPM;  [facility];  Service: Podiatry;  Laterality: Left;   WOUND DEBRIDEMENT Left 12/18/2019   Procedure: LEFT FOOT DEBRIDEMENT WITH PARTIAL INCISION OF INFECTED BONE;  Surgeon: Evelina Bucy, DPM;  Location: [place];  Service: Podiatry;  Laterality: Left;  LEFT FOOT DEBRIDEMENT WITH PARTIAL INCISION OF INFECTED BONE   WOUND DEBRIDEMENT Right 04/01/2021   Procedure: Right foot wound debridement and irrigation and closure, bone biopsy;  Surgeon: Evelina Bucy, DPM;  Location: [place];  Service: Podiatry;  Laterality: Right;    Family History  Problem Relation Age of Onset   Heart attack Mother        Living, 87   Cancer Father        Deceased   Healthy Brother    Healthy Sister    Colon cancer Neg Hx    Colon polyps Neg Hx       Social History   Socioeconomic History   Marital status: Widowed    Spouse name: Not on file   Number of children: 0   Years of education: Not on file   Highest education level: 8th grade  Occupational History   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  Tobacco Use   Smoking status: Former    Packs/day: 2.50    Years: 25.00    Pack years: 62.50    Types: Cigarettes    Quit date: 08/05/1984    Years since quitting: 36.8   Smokeless tobacco: Never  Vaping Use   Vaping Use: Never used  Substance and Sexual Activity   Alcohol use: Yes    Alcohol/week: 50.0 standard drinks    Types: 50 Standard drinks or equivalent per week    Comment: Drinks 6 beers, liquor 4-6oz (both daily) x 35 years   Drug use: Not Currently    Types: Marijuana   Sexual activity: Yes  Birth control/protection: None  Other Topics Concern   Not on  file  Social History Narrative   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 Highest level of education:  8th grade   He lives with wife.  No children.   Left handed   Drinks soda, water-no coffee, no tea   Social Determinants of Health   Financial Resource Strain: Not on file  Food Insecurity: Not on file  Transportation Needs: Not on file  Physical Activity: Not on file  Stress: Not on file  Social Connections: Not on file    No Known Allergies   Current Outpatient Medications:    acetaminophen (TYLENOL) 325 MG tablet, Take 2 tablets (650 mg total) by mouth every 6 (six) hours as needed for mild pain (or Fever >/= 101)., Disp: 20 tablet, Rfl: 0   ALPRAZolam (XANAX) 1 MG tablet, Take 0.5 tablets (0.5 mg total) by mouth 4 (four) times daily as needed for anxiety. (Patient not taking: No sig reported), Disp: 12 tablet, Rfl: 0   amLODipine (NORVASC) 10 MG tablet, Take 1 tablet (10 mg total) by mouth daily. (Patient not taking: Reported on 06/09/2021), Disp: 90 tablet, Rfl: 1   amoxicillin-clavulanate (AUGMENTIN) 875-125 MG tablet, Take 1 tablet by mouth 2 (two) times daily. (Patient not taking: Reported on 06/09/2021), Disp: 60 tablet, Rfl: 3   aspirin EC 81 MG EC tablet, Take 1 tablet (81 mg total) by mouth daily. Swallow whole. (Patient not taking: No sig reported), Disp: 30 tablet, Rfl: 11   atorvastatin (LIPITOR) 40 MG tablet, Take 40 mg by mouth every evening.  (Patient not taking: No sig reported), Disp: , Rfl: 10   diclofenac Sodium (VOLTAREN) 1 % GEL, Apply 1 application topically daily as needed (pain). (Patient not taking: Reported on 05/05/2021), Disp: , Rfl:    ferrous sulfate 325 (65 FE) MG tablet, Take 1 tablet (325 mg total) by mouth daily. (Patient not taking: Reported on 03/27/2021), Disp: 30 tablet, Rfl: 1   folic acid (FOLVITE) 1 MG tablet, Take 1 tablet (1 mg total) by mouth daily. (Patient not taking: No sig reported), Disp: 30 tablet, Rfl:  0   gabapentin (NEURONTIN) 300 MG capsule, TAKE 3 CAPSULES BY MOUTH IN THE MORNING, 3 CAPSULES AT NOON, AND 4 CAPSULES AT BEDTIME FOR NERVE PAIN. (Patient not taking: No sig reported), Disp: 300 capsule, Rfl: 1   glipiZIDE (GLUCOTROL) 5 MG tablet, Take 1 tablet (5 mg total) by mouth 2 (two) times daily. (Patient not taking: No sig reported), Disp: 180 tablet, Rfl: 1   hydrALAZINE (APRESOLINE) 50 MG tablet, Take 1 tablet (50 mg total) by mouth every 8 (eight) hours. (Patient not taking: No sig reported), Disp: 90 tablet, Rfl: 0   lidocaine (XYLOCAINE) 5 % ointment, Apply topically 2 (two) times daily as needed. (Patient not taking: Reported on 06/09/2021), Disp: , Rfl:    lisinopril (ZESTRIL) 40 MG tablet, Take 1 tablet (40 mg total) by mouth at bedtime. (Patient not taking: Reported on 06/09/2021), Disp: 30 tablet, Rfl: 0   metFORMIN (GLUCOPHAGE) 500 MG tablet, Take 1 tablet (500 mg total) by mouth 2 (two) times daily with a meal. (Patient not taking: No sig reported), Disp: 180 tablet, Rfl: 1   Multiple Vitamin (MULTIVITAMIN WITH MINERALS) TABS tablet, Take 1 tablet by mouth daily. (Patient not taking: No sig reported), Disp: , Rfl:    silver sulfADIAZINE (SILVADENE) 1 % cream, Apply pea-sized amount to wound daily. (Patient not taking: No  sig reported), Disp: 50 g, Rfl: 0   thiamine 100 MG tablet, Take 1 tablet (100 mg total) by mouth daily. (Patient not taking: No sig reported), Disp: 30 tablet, Rfl: 0   Review of Systems  Constitutional:  Negative for activity change, appetite change, chills, diaphoresis, fatigue, fever and unexpected weight change.  HENT:  Negative for congestion, rhinorrhea, sinus pressure, sneezing, sore throat and trouble swallowing.   Eyes:  Negative for photophobia and visual disturbance.  Respiratory:  Negative for cough, chest tightness, shortness of breath, wheezing and stridor.   Cardiovascular:  Negative for chest pain, palpitations and leg swelling.  Gastrointestinal:   Negative for abdominal distention, abdominal pain, anal bleeding, blood in stool, constipation, diarrhea, nausea and vomiting.  Genitourinary:  Negative for difficulty urinating, dysuria, flank pain and hematuria.  Musculoskeletal:  Negative for arthralgias, back pain, gait problem, joint swelling and myalgias.  Skin:  Positive for wound. Negative for color change, pallor and rash.  Neurological:  Negative for dizziness, tremors, weakness and light-headedness.  Hematological:  Negative for adenopathy. Does not bruise/bleed easily.  Psychiatric/Behavioral:  Positive for dysphoric mood and suicidal ideas. Negative for agitation, behavioral problems, confusion, decreased concentration, self-injury and sleep disturbance. The patient is nervous/anxious.       Objective:   Physical Exam Constitutional:      Appearance: He is well-developed.  HENT:     Head: Normocephalic and atraumatic.  Eyes:     General:        Right eye: No discharge.        Left eye: No discharge.     Conjunctiva/sclera: Conjunctivae normal.  Cardiovascular:     Rate and Rhythm: Normal rate and regular rhythm.  Pulmonary:     Effort: Pulmonary effort is normal. No respiratory distress.     Breath sounds: No wheezing.  Abdominal:     General: There is no distension.     Palpations: Abdomen is soft.  Musculoskeletal:        General: No tenderness. Normal range of motion.     Cervical back: Normal range of motion and neck supple.  Skin:    General: Skin is warm and dry.     Coloration: Skin is not pale.     Findings: No erythema or rash.  Neurological:     General: No focal deficit present.     Mental Status: He is alert and oriented to person, place, and time.  Psychiatric:        Attention and Perception: Attention normal.        Mood and Affect: Mood is depressed. Affect is tearful.        Speech: Speech normal.        Behavior: Behavior normal. Behavior is cooperative.        Thought Content: Thought  content normal.        Cognition and Memory: Cognition normal.        Judgment: Judgment normal.    Left BKA site 05/05/2021:      Right foot 05/05/2021:      Right foot June 09, 2021:         Assessment & Plan:  Severe depression with passive suicidal ideation: He is going to be seeing his primary care physician on Monday and have encouraged him to talk extensively to PCP and to engage in therapy.  If his depression continues to drive him to be nonadherent to medical therapy he is in danger of losing his life and certainly in  danger of needing further surgery to troll the infection in his left foot. He is assured me that he would not commit suicide and is contracted for safety.  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 later this week.  I have also emphasized the utmost importance of his protecting the wound with dressing and wearing an appropriate boot or shoe to protect this site (he again came in without shoes or dressing on the foot)   Diabetes mellitus has been poorly controlled as he is not taking medications for it either  Alcoholism he claims to now been clean since he was in the hospital  Hypertension poorly controlled  Alcoholism he claims to be not drinking alcohol at all and does appear sober during this visit.  I spent 44 minutes with the patient including face to face counseling of the patient and his friend regarding his severe depression providing supportive therapy counseling him and trying to convince him to engage in care and to also take medications and to want to take care of himself,  reviewing  medical records before and during the visit and in coordination of his care.

## 2021-06-11 ENCOUNTER — Other Ambulatory Visit: Payer: Self-pay

## 2021-06-11 ENCOUNTER — Ambulatory Visit (INDEPENDENT_AMBULATORY_CARE_PROVIDER_SITE_OTHER): Payer: Medicare Other | Admitting: Podiatry

## 2021-06-11 DIAGNOSIS — L97514 Non-pressure chronic ulcer of other part of right foot with necrosis of bone: Secondary | ICD-10-CM

## 2021-06-11 MED ORDER — SILVER SULFADIAZINE 1 % EX CREA: TOPICAL_CREAM | CUTANEOUS | 0 refills | Status: DC

## 2021-06-11 NOTE — Progress Notes (Signed)
  Subjective:  Patient ID: Eugene Diaz, male    DOB: 12-18-1965,  MRN: [id number]  Chief Complaint  Patient presents with   Foot Ulcer    Follow up right foot ulcer. Pt came in office for recheck wound. Wound was not covered when pt came in office. There is some clear drainage coming from wound. Pt has no questions or concerns.    DOS: 04/01/21 Procedures:             1) incision and drainage of right foot abscess, complex             2) partial first ray resection right  DOS: 03/29/21 Procedures:             1) Right foot wound debridement and closure             2) Bone biopsy  55 y.o. male presents with the above complaint. History confirmed with patient. Came into the office barefoot  Objective:  Physical Exam: no tenderness at the surgical site, local edema noted, and calf supple, nontender. Incision: dehiscence measuring 2*1; granular base, light ss drainage. No warmth, no erythema, no SOI.    Assessment:   1. 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  Post-operative State -Wound cleansed and debrided -Dressing applied consisting of saline WTD -Offload ulcer with surgical shoe -He has not gone to the wound care center. As the wound is improving we can hold off on keeping that appt.  Procedure: Selective Debridement of Wound Rationale: Removal of devitalized tissue from the wound to promote healing.  Pre-Debridement Wound Measurements: 2 cm x 1 cm x 0.2 cm  Post-Debridement Wound Measurements: same as pre-debridement. Type of Debridement: sharp selective Instrumentation: dermal curette, tissue nipper Tissue Removed: Devitalized soft-tissue Dressing: Dry, sterile, compression dressing. Disposition: Patient tolerated procedure well.   No follow-ups on file.

## 2021-06-11 NOTE — Telephone Encounter (Signed)
Patient seen today will forego wound care center and continue to see him

## 2021-06-14 ENCOUNTER — Telehealth: Payer: Self-pay | Admitting: Podiatry

## 2021-06-14 NOTE — Telephone Encounter (Signed)
Eugene Diaz from [facility] wound center called stating they don't offer HBO services. They only offer basic wound care management. Please advise.

## 2021-07-06 ENCOUNTER — Other Ambulatory Visit: Payer: Self-pay

## 2021-07-06 ENCOUNTER — Ambulatory Visit (INDEPENDENT_AMBULATORY_CARE_PROVIDER_SITE_OTHER): Payer: Medicare Other | Admitting: Podiatry

## 2021-07-06 DIAGNOSIS — L97514 Non-pressure chronic ulcer of other part of right foot with necrosis of bone: Secondary | ICD-10-CM

## 2021-07-06 NOTE — Progress Notes (Signed)
  Subjective:  Patient ID: Eugene Diaz, male    DOB: 16-Feb-1966,  MRN: [id number]  Chief Complaint  Patient presents with   Diabetic Ulcer    Follow up right foot ulcer. Pt states no questions ort concerns. Wound is currently dry with scabs present.    DOS: 04/01/21 Procedures:             1) incision and drainage of right foot abscess, complex             2) partial first ray resection right  DOS: 03/29/21 Procedures:             1) Right foot wound debridement and closure             2) Bone biopsy  55 y.o. male presents with the above complaint. History confirmed with patient. Wearing a shoe today. Did have a witnessed fall in the waiting area denies physical injury. Objective:  Physical Exam: no tenderness at the surgical site, local edema noted, and calf supple, nontender. Foot with crusted dirt, small puncture wound plantar foot with surrounding granular base, no warmth, erythema, SOI. Incision: 1cm fissure remaining with granular base, light ss drainage. No warmth, no erythema, no SOI.    Assessment:   1. 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  Post-operative State -Wound cleansed and minimally debrided -He does appear to have a small puncture wound plantar and proximal to the surgical site. We discussed importance of wearing shoes to prevent problems.  -Wound dressed with SSD and DSD. Patient to dress as such daily. -Advised to present promptly should wound worsen with worsening drainage, warmth, redness  Return in about 4 weeks (around 08/03/2021) for Wound Care.

## 2021-07-23 ENCOUNTER — Ambulatory Visit (INDEPENDENT_AMBULATORY_CARE_PROVIDER_SITE_OTHER): Payer: Medicare Other | Admitting: Infectious Disease

## 2021-07-23 ENCOUNTER — Other Ambulatory Visit: Payer: Self-pay

## 2021-07-23 ENCOUNTER — Encounter: Payer: Self-pay | Admitting: Infectious Disease

## 2021-07-23 VITALS — BP 167/93 | HR 83 | Temp 98.1°F

## 2021-07-23 DIAGNOSIS — M86272 Subacute osteomyelitis, left ankle and foot: Secondary | ICD-10-CM

## 2021-07-23 DIAGNOSIS — F418 Other specified anxiety disorders: Secondary | ICD-10-CM | POA: Diagnosis not present

## 2021-07-23 DIAGNOSIS — E1169 Type 2 diabetes mellitus with other specified complication: Secondary | ICD-10-CM

## 2021-07-23 DIAGNOSIS — G621 Alcoholic polyneuropathy: Secondary | ICD-10-CM

## 2021-07-23 DIAGNOSIS — M869 Osteomyelitis, unspecified: Secondary | ICD-10-CM

## 2021-07-23 DIAGNOSIS — F1010 Alcohol abuse, uncomplicated: Secondary | ICD-10-CM | POA: Diagnosis not present

## 2021-07-23 MED ORDER — AMOXICILLIN-POT CLAVULANATE 875-125 MG PO TABS: 1.0000 | ORAL_TABLET | Freq: Two times a day (BID) | ORAL | 3 refills | Status: DC

## 2021-07-23 NOTE — Progress Notes (Signed)
Subjective:    Chief complaint: Follow-up for osteomyelitis   Patient ID: Eugene Diaz, male    DOB: 23-Feb-1966, 55 y.o.   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  He had additional further debridement as well with bone biopsy of the first metatarsal and wound closure.  He grew E. Coli group B streptococcus as well as beta-lactamase producing Prevotella.  He was narrowed to intravenous cefazolin with oral metronidazole.  He was counseled not to drink alcohol while on the metronidazole and he claims he is not taking the metronidazole.  We had report from his home health company that the cefazolin which his sister's been giving him is only been being given twice daily.   Several visits ago we had discontinued his IV therapy and I placed him on Augmentin.  Since then he has seen [name] twice.   The patient apparently had stopped taking his Augmentin and is not taking any of his oral medications whatsoever.  At last visit he was suffering from significant depressive symptoms with passive suicidal ideation and was not taking medications.  Since I last saw him he is [facility] improved emotionally. He has stayed completely away from alcohol x 3 weeks.  His mood is much improved.  Occasionally he does get sad because he again remembers the loss of his wife who he misses so much.  He is continue to follow-up closely with [name]        Past Medical History:  Diagnosis Date   Alcohol abuse    Alcoholic peripheral neuropathy ([place])    Anxiety    Arthritis    Asthma    followed by pcp    B12 deficiency    Depression    Diabetic neuropathy ([place])    Edema of both lower extremities    Essential tremor    neurologist-- dr patel--- due to alcohol abuse   GERD (gastroesophageal reflux disease)    History of cellulitis 2020   left lower leg;   recurrent 03-25-2020   History of esophageal stricture    s/p  dilatation 02-13-2018   History of osteomyelitis 2020   left great toe     Hypercholesterolemia    Hypertension    followed by pcp  (nuclear stress test 03-11-2014 low risk w/ no ischemia, ef 65%   Normocytic anemia    followed by pcp   (03-18-2020 had transfusion's 02/ 2021)   Peripheral vascular disease ([place])    Severe depression ([place]) 06/09/2021   Status post incision and drainage followed by Dr [name] and pcp   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   (03-17-2020  pt states most of incision is healed with exception an area that is still draining,  daily dressing change),  foot is red but not warm to the touch and has swelling but has improved)   Suicidal ideation 06/09/2021   Thrombocytopenia ([place])    chronic   Type 2 diabetes mellitus ([place])    followed by pcp---    (03-18-2020  pt stated does not check blood sugar)    Past Surgical History:  Procedure Laterality Date   AMPUTATION  Left 10/16/2019   Procedure: Left partial second ray resection; placement of antibiotic beads;  Surgeon: Evelina Bucy, DPM;  Location: [facility];  Service: Podiatry;  Laterality: Left;   AMPUTATION Left 11/13/2019   Procedure: Left Midfoot Amputation - Transmetatarsal vs. Lisfranc; Placement antibiotic beads;  Surgeon: Evelina Bucy, DPM;  Location: [facility];  Service: Podiatry;  Laterality: Left;   AMPUTATION Left 12/21/2019   Procedure: Chopart Amputation left foot;  Surgeon: Evelina Bucy, DPM;  Location: [place];  Service: Podiatry;  Laterality: Left;   AMPUTATION Left 06/24/2020   Procedure: LEFT BELOW KNEE AMPUTATION;  Surgeon: Newt Minion, MD;  Location: [place];  Service:  Orthopedics;  Laterality: Left;   AMPUTATION Right 03/29/2021   Procedure: AMPUTATION RAY;  Surgeon: Evelina Bucy, DPM;  Location: [place];  Service: Podiatry;  Laterality: Right;   AMPUTATION TOE Left 08/14/2019   Procedure: AMPUTATION GREAT TOE;  Surgeon: Evelina Bucy, DPM;  [facility];  Service: Podiatry;  Laterality: Left;   APPLICATION OF WOUND VAC Left 12/21/2019   Procedure: Application Of Wound Vac;  Surgeon: Evelina Bucy, DPM;  Location: [place];  Service: Podiatry;  Laterality: Left;   BIOPSY  02/13/2018   Procedure: BIOPSY;  Surgeon: Danie Binder, MD;  [facility];  Service: Endoscopy;;  transverse colon biopsy, gastric biopsy   COLONOSCOPY WITH PROPOFOL N/A 02/13/2018   Procedure: COLONOSCOPY WITH PROPOFOL;  Surgeon: Danie Binder, MD;  [facility];  Service: Endoscopy;  Laterality: N/A;  11:15am   ESOPHAGOGASTRODUODENOSCOPY (EGD) WITH PROPOFOL N/A 02/13/2018   Procedure: ESOPHAGOGASTRODUODENOSCOPY (EGD) WITH PROPOFOL;  Surgeon: Danie Binder, MD;  [facility];  Service: Endoscopy;  Laterality: N/A;   I & D EXTREMITY Left 07/16/2019   Procedure: Insicion  AND DEBRIDEMENT EXTREMITY;  Surgeon: Evelina Bucy, DPM;  Location: [place];  Service: Podiatry;  Laterality: Left;   I & D EXTREMITY Left 06/22/2020   Procedure: IRRIGATION AND DEBRIDEMENT EXTREMITY, left foot and ankle;  Surgeon: Evelina Bucy, DPM;  Location: [place];  Service: Podiatry;  Laterality: Left;   I & D EXTREMITY Right 03/29/2021   Procedure: IRRIGATION AND DEBRIDEMENT EXTREMITY;  Surgeon: Evelina Bucy, DPM;  Location: [place];  Service: Podiatry;  Laterality: Right;   INCISION AND DRAINAGE OF WOUND Left 10/13/2019   Procedure: IRRIGATION AND DEBRIDEMENT WOUND OF LEFT FOOT AND FIRST METATARSAL RESECTION;  Surgeon: Evelina Bucy, DPM;  [facility];  Service: Podiatry;  Laterality: Left;   IRRIGATION AND DEBRIDEMENT FOOT Left 11/11/2019   Procedure: Left Foot  Wound Irrigation and Debridement;  Surgeon: Evelina Bucy, DPM;  [facility];  Service: Podiatry;  Laterality: Left;   Left arm     Left arm repair (tendon and artery)   PERCUTANEOUS PINNING  07/16/2019   Procedure: Open Reduction Percutaneous Pinning Extremity;  Surgeon: Evelina Bucy, DPM;  Location: [place];  Service: Podiatry;;   PILONIDAL CYST EXCISION N/A 08/08/2014   Procedure: CYST EXCISION PILONIDAL EXTENSIVE;  Surgeon: Jamesetta So, MD;  [facility];  Service: General;  Laterality: N/A;   POLYPECTOMY  02/13/2018   Procedure: POLYPECTOMY;  Surgeon: Danie Binder, MD;  [facility];  Service: Endoscopy;;  transverse colon polyp hs, rectal polyps times 2   SAVORY DILATION N/A 02/13/2018   Procedure: SAVORY DILATION;  Surgeon: Danie Binder, MD;  [facility];  Service: Endoscopy;  Laterality:  N/A;   WOUND DEBRIDEMENT Left 09/04/2019   Procedure: DEBRIDEMENT WOUND WITH COMPLEX  REPAIR OF DEHISCENCE;  Surgeon: Evelina Bucy, DPM;  Location: [place];  Service: Podiatry;  Laterality: Left;  Leave patient on stretcher   WOUND DEBRIDEMENT Left 10/16/2019   Procedure: Left foot wound debridement and closure;  Surgeon: Evelina Bucy, DPM;  [facility];  Service: Podiatry;  Laterality: Left;   WOUND DEBRIDEMENT Left 12/18/2019   Procedure: LEFT FOOT DEBRIDEMENT WITH PARTIAL INCISION OF INFECTED BONE;  Surgeon: Evelina Bucy, DPM;  Location: [place];  Service: Podiatry;  Laterality: Left;  LEFT FOOT DEBRIDEMENT WITH PARTIAL INCISION OF INFECTED BONE   WOUND DEBRIDEMENT Right 04/01/2021   Procedure: Right foot wound debridement and irrigation and closure, bone biopsy;  Surgeon: Evelina Bucy, DPM;  Location: [place];  Service: Podiatry;  Laterality: Right;    Family History  Problem Relation Age of Onset   Heart attack Mother        Living, 28   Cancer Father        Deceased   Healthy Brother    Healthy Sister    Colon cancer  Neg Hx    Colon polyps Neg Hx       Social History   Socioeconomic History   Marital status: Widowed    Spouse name: Not on file   Number of children: 0   Years of education: Not on file   Highest education level: 8th grade  Occupational History   Occupation: Manufact[employer]    Comment: Assembling RV's and Horse Trailers.  Tobacco Use   Smoking status: Former    Packs/day: 2.50    Years: 25.00    Pack years: 62.50    Types: Cigarettes    Quit date: 08/05/1984    Years since quitting: 36.9   Smokeless tobacco: Never  Vaping Use   Vaping Use: Never used  Substance and Sexual Activity   Alcohol use: Yes    Alcohol/week: 50.0 standard drinks    Types: 50 Standard drinks or equivalent per week    Comment: Drinks 6 beers, liquor 4-6oz (both daily) x 35 years   Drug use: Not Currently    Types: Marijuana   Sexual activity: Yes    Birth control/protection: None  Other Topics Concern   Not on file  Social History Narrative   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 Highest level of education:  8th grade   He lives with wife.  No children.   Left handed   Drinks soda, water-no coffee, no tea   Social Determinants of Health   Financial Resource Strain: Not on file  Food Insecurity: Not on file  Transportation Needs: Not on file  Physical Activity: Not on file  Stress: Not on file  Social Connections: Not on file    No Known Allergies   Current Outpatient Medications:    amoxicillin-clavulanate (AUGMENTIN) 875-125 MG tablet, Take 1 tablet by mouth 2 (two) times daily., Disp: 60 tablet, Rfl: 3   acetaminophen (TYLENOL) 325 MG tablet, Take 2 tablets (650 mg total) by mouth every 6 (six) hours as needed for mild pain (or Fever >/= 101). (Patient not taking: Reported on 07/23/2021), Disp: 20 tablet, Rfl: 0   ALPRAZolam (XANAX) 1 MG tablet, Take 0.5 tablets (0.5 mg total) by mouth 4 (four) times daily as needed for anxiety. (Patient not  taking: No sig reported), Disp: 12 tablet, Rfl: 0  amLODipine (NORVASC) 10 MG tablet, Take 1 tablet (10 mg total) by mouth daily. (Patient not taking: No sig reported), Disp: 90 tablet, Rfl: 1   aspirin EC 81 MG EC tablet, Take 1 tablet (81 mg total) by mouth daily. Swallow whole. (Patient not taking: No sig reported), Disp: 30 tablet, Rfl: 11   atorvastatin (LIPITOR) 40 MG tablet, Take 40 mg by mouth every evening.  (Patient not taking: No sig reported), Disp: , Rfl: 10   diclofenac Sodium (VOLTAREN) 1 % GEL, Apply 1 application topically daily as needed (pain). (Patient not taking: No sig reported), Disp: , Rfl:    ferrous sulfate 325 (65 FE) MG tablet, Take 1 tablet (325 mg total) by mouth daily. (Patient not taking: Reported on 03/27/2021), Disp: 30 tablet, Rfl: 1   folic acid (FOLVITE) 1 MG tablet, Take 1 tablet (1 mg total) by mouth daily. (Patient not taking: No sig reported), Disp: 30 tablet, Rfl: 0   gabapentin (NEURONTIN) 300 MG capsule, TAKE 3 CAPSULES BY MOUTH IN THE MORNING, 3 CAPSULES AT NOON, AND 4 CAPSULES AT BEDTIME FOR NERVE PAIN. (Patient not taking: No sig reported), Disp: 300 capsule, Rfl: 1   glipiZIDE (GLUCOTROL) 5 MG tablet, Take 1 tablet (5 mg total) by mouth 2 (two) times daily. (Patient not taking: No sig reported), Disp: 180 tablet, Rfl: 1   hydrALAZINE (APRESOLINE) 50 MG tablet, Take 1 tablet (50 mg total) by mouth every 8 (eight) hours. (Patient not taking: No sig reported), Disp: 90 tablet, Rfl: 0   lidocaine (XYLOCAINE) 5 % ointment, Apply topically 2 (two) times daily as needed. (Patient not taking: No sig reported), Disp: , Rfl:    lisinopril (ZESTRIL) 40 MG tablet, Take 1 tablet (40 mg total) by mouth at bedtime. (Patient not taking: No sig reported), Disp: 30 tablet, Rfl: 0   metFORMIN (GLUCOPHAGE) 500 MG tablet, Take 1 tablet (500 mg total) by mouth 2 (two) times daily with a meal. (Patient not taking: No sig reported), Disp: 180 tablet, Rfl: 1   Multiple Vitamin  (MULTIVITAMIN WITH MINERALS) TABS tablet, Take 1 tablet by mouth daily. (Patient not taking: No sig reported), Disp: , Rfl:    silver sulfADIAZINE (SILVADENE) 1 % cream, Apply pea-sized amount to wound daily. (Patient not taking: Reported on 07/23/2021), Disp: 50 g, Rfl: 0   thiamine 100 MG tablet, Take 1 tablet (100 mg total) by mouth daily. (Patient not taking: No sig reported), Disp: 30 tablet, Rfl: 0   Review of Systems  Constitutional:  Negative for activity change, appetite change, chills, diaphoresis, fatigue, fever and unexpected weight change.  HENT:  Negative for congestion, rhinorrhea, sinus pressure, sneezing, sore throat and trouble swallowing.   Eyes:  Negative for photophobia and visual disturbance.  Respiratory:  Negative for cough, chest tightness, shortness of breath, wheezing and stridor.   Cardiovascular:  Negative for chest pain, palpitations and leg swelling.  Gastrointestinal:  Negative for abdominal distention, abdominal pain, anal bleeding, blood in stool, constipation, diarrhea, nausea and vomiting.  Genitourinary:  Negative for difficulty urinating, dysuria, flank pain and hematuria.  Musculoskeletal:  Negative for arthralgias, back pain, gait problem, joint swelling and myalgias.  Skin:  Positive for wound. Negative for color change, pallor and rash.  Neurological:  Negative for dizziness, tremors, weakness and light-headedness.  Hematological:  Negative for adenopathy. Does not bruise/bleed easily.  Psychiatric/Behavioral:  Positive for dysphoric mood. Negative for agitation, behavioral problems, confusion, decreased concentration and sleep disturbance. The patient is nervous/anxious.  Objective:   Physical Exam Constitutional:      Appearance: He is well-developed.  HENT:     Head: Normocephalic and atraumatic.  Eyes:     Conjunctiva/sclera: Conjunctivae normal.  Cardiovascular:     Rate and Rhythm: Normal rate and regular rhythm.  Pulmonary:      Effort: Pulmonary effort is normal. No respiratory distress.     Breath sounds: No wheezing.  Abdominal:     General: There is no distension.     Palpations: Abdomen is soft.  Musculoskeletal:        General: No tenderness. Normal range of motion.     Cervical back: Normal range of motion and neck supple.  Skin:    General: Skin is warm and dry.     Coloration: Skin is not pale.     Findings: No erythema or rash.  Neurological:     General: No focal deficit present.     Mental Status: He is alert and oriented to person, place, and time.  Psychiatric:        Mood and Affect: Mood normal.        Behavior: Behavior normal.        Thought Content: Thought content normal.        Judgment: Judgment normal.    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  Will set CBC with differential and CMP  I will continue his Augmentin and he will come back in 1 month's time we will continue to follow closely [name]  Diabetes mellitus: Needs to get better control of this still.  Alcoholism: He has been without any alcohol for at least 3 weeks I commended him on his dramatic step that he has taken.    Severe depression with prior suicidal ideation this is dramatically improved as well now.

## 2021-07-24 LAB — COMPLETE METABOLIC PANEL WITH GFR
AG Ratio: 1.2 (calc) (ref 1.0–2.5)
ALT: 14 U/L (ref 9–46)
AST: 16 U/L (ref 10–35)
Albumin: 4 g/dL (ref 3.6–5.1)
Alkaline phosphatase (APISO): 80 U/L (ref 35–144)
BUN: 9 mg/dL (ref 7–25)
CO2: 28 mmol/L (ref 20–32)
Calcium: 9.4 mg/dL (ref 8.6–10.3)
Chloride: 105 mmol/L (ref 98–110)
Creat: 0.99 mg/dL (ref 0.70–1.30)
Globulin: 3.3 g/dL (calc) (ref 1.9–3.7)
Glucose, Bld: 149 mg/dL — ABNORMAL HIGH (ref 65–99)
Potassium: 4.1 mmol/L (ref 3.5–5.3)
Sodium: 141 mmol/L (ref 135–146)
Total Bilirubin: 0.5 mg/dL (ref 0.2–1.2)
Total Protein: 7.3 g/dL (ref 6.1–8.1)
eGFR: 89 mL/min/{1.73_m2} (ref >=60)

## 2021-07-24 LAB — CBC WITH DIFFERENTIAL/PLATELET
Absolute Monocytes: 167 cells/uL — ABNORMAL LOW (ref 200–950)
Basophils Absolute: 48 cells/uL (ref 0–200)
Basophils Relative: 1.1 %
Eosinophils Absolute: 436 cells/uL (ref 15–500)
Eosinophils Relative: 9.9 %
HCT: 38.2 % — ABNORMAL LOW (ref 38.5–50.0)
Hemoglobin: 12.6 g/dL — ABNORMAL LOW (ref 13.2–17.1)
Lymphs Abs: 1439 cells/uL (ref 850–3900)
MCH: 29.5 pg (ref 27.0–33.0)
MCHC: 33 g/dL (ref 32.0–36.0)
MCV: 89.5 fL (ref 80.0–100.0)
MPV: 10 fL (ref 7.5–12.5)
Monocytes Relative: 3.8 %
Neutro Abs: 2310 cells/uL (ref 1500–7800)
Neutrophils Relative %: 52.5 %
Platelets: 200 10*3/uL (ref 140–400)
RBC: 4.27 10*6/uL (ref 4.20–5.80)
RDW: 13.8 % (ref 11.0–15.0)
Total Lymphocyte: 32.7 %
WBC: 4.4 10*3/uL (ref 3.8–10.8)

## 2021-07-24 LAB — SEDIMENTATION RATE: Sed Rate: 38 mm/h — ABNORMAL HIGH (ref 0–20)

## 2021-07-24 LAB — C-REACTIVE PROTEIN: CRP: 19.8 mg/L — ABNORMAL HIGH (ref <8.0)

## 2021-07-29 NOTE — Telephone Encounter (Signed)
error 

## 2021-08-06 ENCOUNTER — Other Ambulatory Visit: Payer: Self-pay

## 2021-08-06 ENCOUNTER — Ambulatory Visit (INDEPENDENT_AMBULATORY_CARE_PROVIDER_SITE_OTHER): Payer: Medicare Other | Admitting: Podiatry

## 2021-08-06 DIAGNOSIS — L97512 Non-pressure chronic ulcer of other part of right foot with fat layer exposed: Secondary | ICD-10-CM

## 2021-08-06 DIAGNOSIS — L97511 Non-pressure chronic ulcer of other part of right foot limited to breakdown of skin: Secondary | ICD-10-CM | POA: Diagnosis not present

## 2021-08-06 NOTE — Progress Notes (Signed)
  Subjective:  Patient ID: Eugene Diaz, male    DOB: 12-17-1965,  MRN: [id number]  No chief complaint on file.  DOS: 04/01/21 Procedures:             1) incision and drainage of right foot abscess, complex             2) partial first ray resection right  DOS: 03/29/21 Procedures:             1) Right foot wound debridement and closure             2) Bone biopsy  55 y.o. male presents with the above complaint. History confirmed with patient.  Objective:  Physical Exam: no tenderness at the surgical site, local edema noted, and calf supple, nontender. Foot with crusted dirt, small healed puncture wound plantar foot. 2nd toe PIPJ ulceration with hammertoe, no warmth, erythema, SOI. Incision: 0.5 cm fissure remaining with granular base, light ss drainage. No warmth, no erythema, no SOI.    Assessment:   1. Ulcer of right foot, limited to breakdown of skin ([place])   2. Ulcer of right foot with fat layer exposed ([place])     Plan:  Patient was evaluated and treated and all questions answered.  Post-operative State -Wound cleansed and minimally debrided. New ulceration 2dn  -The puncture site appears healed without recurrence. -Wounds dressed with SSD and band-aid. Patient to dress as such daily. -Discussed the importance of dressing the wound daily. He did not have a dressing on today -Advised to present promptly should wound worsen with worsening drainage, warmth, redness  Return in about 4 weeks (around 09/03/2021).

## 2021-08-18 ENCOUNTER — Ambulatory Visit (INDEPENDENT_AMBULATORY_CARE_PROVIDER_SITE_OTHER): Payer: Medicare Other | Admitting: Infectious Disease

## 2021-08-18 ENCOUNTER — Other Ambulatory Visit: Payer: Self-pay

## 2021-08-18 ENCOUNTER — Encounter: Payer: Self-pay | Admitting: Infectious Disease

## 2021-08-18 VITALS — BP 147/76 | HR 94 | Temp 98.2°F | Ht 74.0 in | Wt 255.0 lb

## 2021-08-18 DIAGNOSIS — M869 Osteomyelitis, unspecified: Secondary | ICD-10-CM

## 2021-08-18 DIAGNOSIS — E1169 Type 2 diabetes mellitus with other specified complication: Secondary | ICD-10-CM

## 2021-08-18 DIAGNOSIS — L089 Local infection of the skin and subcutaneous tissue, unspecified: Secondary | ICD-10-CM

## 2021-08-18 DIAGNOSIS — S88112A Complete traumatic amputation at level between knee and ankle, left lower leg, initial encounter: Secondary | ICD-10-CM | POA: Diagnosis not present

## 2021-08-18 DIAGNOSIS — E11628 Type 2 diabetes mellitus with other skin complications: Secondary | ICD-10-CM | POA: Diagnosis not present

## 2021-08-18 DIAGNOSIS — G621 Alcoholic polyneuropathy: Secondary | ICD-10-CM

## 2021-08-18 HISTORY — DX: Osteomyelitis, unspecified: M86.9

## 2021-08-18 NOTE — Progress Notes (Signed)
Subjective:    Chief complaint: Follow-up for osteomyelitis and diabetic foot ulcer   Patient ID: Eugene Diaz, male    DOB: 05-30-1966, 55 y.o.   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  He had additional further debridement as well with bone biopsy of the first metatarsal and wound closure.  He grew E. Coli group B streptococcus as well as beta-lactamase producing Prevotella.  He was narrowed to intravenous cefazolin with oral metronidazole.  He was counseled not to drink alcohol while on the metronidazole and he claims he is not taking the metronidazole.  We had report from his home health company that the cefazolin which his sister's been giving him is only been being given twice daily.   Several visits ago we had discontinued his IV therapy and I placed him on Augmentin.  Since then he has seen [name] twice.  Several visits ago he had stopped taking his Augmentin but we are able to convince him to start this again.  At that time he was suffering from significant depressive symptoms including passive suicidal ideation.   In the interim he had improved dramatically from a emotional standpoint.  He had stayed away from alcohol altogether for 3 weeks.  He was taking his Augmentin and has seen [name] in follow-up who is happy with how his wound appears.  His inflammatory markers have also improved in the interim.          Past Medical History:  Diagnosis Date   Alcohol abuse    Alcoholic peripheral neuropathy ([place])    Anxiety    Arthritis    Asthma    followed by pcp    B12 deficiency    Depression    Diabetic neuropathy (Eugene Diaz)    Edema of both lower extremities    Essential tremor    neurologist-- dr patel--- due to alcohol abuse   GERD (gastroesophageal reflux disease)    History of cellulitis 2020   left lower leg;   recurrent 03-25-2020   History of esophageal stricture    s/p  dilatation 02-13-2018   History of osteomyelitis 2020   left great toe     Hypercholesterolemia    Hypertension    followed by pcp  (nuclear stress test 03-11-2014 low risk w/ no ischemia, ef 65%   Normocytic anemia    followed by pcp   (03-18-2020 had transfusion's 02/ 2021)   Osteomyelitis of great toe of right foot (Eugene Diaz) 08/18/2021   Peripheral vascular disease (Eugene Diaz)    Severe depression (Eugene Diaz) 06/09/2021   Status post incision and drainage followed by Dr [name] and pcp   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   (03-17-2020  pt states most of incision is healed with exception an area that is still draining,  daily dressing change),  foot is red but not warm to the touch and has swelling but has improved)   Suicidal ideation 06/09/2021   Thrombocytopenia ([place])    chronic   Type 2 diabetes mellitus (Eugene Diaz)    followed by pcp---    (03-18-2020  pt stated does not check blood sugar)  Past Surgical History:  Procedure Laterality Date   AMPUTATION Left 10/16/2019   Procedure: Left partial second ray resection; placement of antibiotic beads;  Surgeon: Eugene Diaz, DPM;  Location: [facility];  Service: Podiatry;  Laterality: Left;   AMPUTATION Left 11/13/2019   Procedure: Left Midfoot Amputation - Transmetatarsal vs. Lisfranc; Placement antibiotic beads;  Surgeon: Eugene Diaz, DPM;  Location: [facility];  Service: Podiatry;  Laterality: Left;   AMPUTATION Left 12/21/2019   Procedure: Chopart Amputation left foot;  Surgeon: Eugene Diaz, DPM;  Location: [place];  Service: Podiatry;  Laterality: Left;   AMPUTATION Left 06/24/2020   Procedure: LEFT BELOW KNEE AMPUTATION;   Surgeon: Eugene Minion, MD;  Location: [place];  Service: Orthopedics;  Laterality: Left;   AMPUTATION Right 03/29/2021   Procedure: AMPUTATION RAY;  Surgeon: Eugene Diaz, DPM;  Location: [place];  Service: Podiatry;  Laterality: Right;   AMPUTATION TOE Left 08/14/2019   Procedure: AMPUTATION GREAT TOE;  Surgeon: Eugene Diaz, DPM;  [facility]: [facility];  Service: Podiatry;  Laterality: Left;   APPLICATION OF WOUND VAC Left 12/21/2019   Procedure: Application Of Wound Vac;  Surgeon: Eugene Diaz, DPM;  Location: [place];  Service: Podiatry;  Laterality: Left;   BIOPSY  02/13/2018   Procedure: BIOPSY;  Surgeon: Eugene Binder, MD;  [facility];  Service: Endoscopy;;  transverse colon biopsy, gastric biopsy   COLONOSCOPY WITH PROPOFOL N/A 02/13/2018   Procedure: COLONOSCOPY WITH PROPOFOL;  Surgeon: Eugene Binder, MD;  [facility];  Service: Endoscopy;  Laterality: N/A;  11:15am   ESOPHAGOGASTRODUODENOSCOPY (EGD) WITH PROPOFOL N/A 02/13/2018   Procedure: ESOPHAGOGASTRODUODENOSCOPY (EGD) WITH PROPOFOL;  Surgeon: Eugene Binder, MD;  [facility];  Service: Endoscopy;  Laterality: N/A;   I & D EXTREMITY Left 07/16/2019   Procedure: Insicion  AND DEBRIDEMENT EXTREMITY;  Surgeon: Eugene Diaz, DPM;  Location: [place];  Service: Podiatry;  Laterality: Left;   I & D EXTREMITY Left 06/22/2020   Procedure: IRRIGATION AND DEBRIDEMENT EXTREMITY, left foot and ankle;  Surgeon: Eugene Diaz, DPM;  Location: [place];  Service: Podiatry;  Laterality: Left;   I & D EXTREMITY Right 03/29/2021   Procedure: IRRIGATION AND DEBRIDEMENT EXTREMITY;  Surgeon: Eugene Diaz, DPM;  Location: [place];  Service: Podiatry;  Laterality: Right;   INCISION AND DRAINAGE OF WOUND Left 10/13/2019   Procedure: IRRIGATION AND DEBRIDEMENT WOUND OF LEFT FOOT AND FIRST METATARSAL RESECTION;  Surgeon: Eugene Diaz, DPM;  [facility]: [facility];  Service: Podiatry;  Laterality: Left;   IRRIGATION AND  DEBRIDEMENT FOOT Left 11/11/2019   Procedure: Left Foot Wound Irrigation and Debridement;  Surgeon: Eugene Diaz, DPM;  [facility]: [facility];  Service: Podiatry;  Laterality: Left;   Left arm     Left arm repair (tendon and artery)   PERCUTANEOUS PINNING  07/16/2019   Procedure: Open Reduction Percutaneous Pinning Extremity;  Surgeon: Eugene Diaz, DPM;  Location: [place];  Service: Podiatry;;   PILONIDAL CYST EXCISION N/A 08/08/2014   Procedure: CYST EXCISION PILONIDAL EXTENSIVE;  Surgeon: Jamesetta So, MD;  [facility]: [facility];  Service: General;  Laterality: N/A;   POLYPECTOMY  02/13/2018   Procedure: POLYPECTOMY;  Surgeon: Eugene Binder, MD;  [facility];  Service: Endoscopy;;  transverse colon polyp hs, rectal polyps times 2   SAVORY DILATION N/A 02/13/2018   Procedure: SAVORY DILATION;  Surgeon: Eugene Binder, MD;  [facility];  Service: Endoscopy;  Laterality: N/A;   WOUND DEBRIDEMENT Left 09/04/2019   Procedure: DEBRIDEMENT WOUND WITH COMPLEX  REPAIR OF DEHISCENCE;  Surgeon: Eugene Diaz, DPM;  Location: [place];  Service: Podiatry;  Laterality: Left;  Leave patient on stretcher   WOUND DEBRIDEMENT Left 10/16/2019   Procedure: Left foot wound debridement and closure;  Surgeon: Eugene Diaz, DPM;  [facility];  Service: Podiatry;  Laterality: Left;   WOUND DEBRIDEMENT Left 12/18/2019   Procedure: LEFT FOOT DEBRIDEMENT WITH PARTIAL INCISION OF INFECTED BONE;  Surgeon: Eugene Diaz, DPM;  Location: [place];  Service: Podiatry;  Laterality: Left;  LEFT FOOT DEBRIDEMENT WITH PARTIAL INCISION OF INFECTED BONE   WOUND DEBRIDEMENT Right 04/01/2021   Procedure: Right foot wound debridement and irrigation and closure, bone biopsy;  Surgeon: Eugene Diaz, DPM;  Location: [place];  Service: Podiatry;  Laterality: Right;    Family History  Problem Relation Age of Onset   Heart attack Mother        Living, 32   Cancer Father        Deceased    Healthy Brother    Healthy Sister    Colon cancer Neg Hx    Colon polyps Neg Hx       Social History   Socioeconomic History   Marital status: Widowed    Spouse name: Not on file   Number of children: 0   Years of education: Not on file   Highest education level: 8th grade  Occupational History   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  Tobacco Use   Smoking status: Former    Packs/day: 2.50    Years: 25.00    Pack years: 62.50    Types: Cigarettes    Quit date: 08/05/1984    Years since quitting: 37.0   Smokeless tobacco: Never  Vaping Use   Vaping Use: Never used  Substance and Sexual Activity   Alcohol use: Yes    Alcohol/week: 50.0 standard drinks    Types: 50 Standard drinks or equivalent per week    Comment: Drinks 6 beers, liquor 4-6oz (both daily) x 35 years   Drug use: Yes    Types: Marijuana   Sexual activity: Yes    Birth control/protection: None  Other Topics Concern   Not on file  Social History Narrative   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 Highest level of education:  8th grade   He lives with wife.  No children.   Left handed   Drinks soda, water-no coffee, no tea   Social Determinants of Health   Financial Resource Strain: Not on file  Food Insecurity: Not on file  Transportation Needs: Not on file  Physical Activity: Not on file  Stress: Not on file  Social Connections: Not on file    No Known Allergies   Current Outpatient Medications:    ALPRAZolam (XANAX) 1 MG tablet, Take 0.5 tablets (0.5 mg total) by mouth 4 (four) times daily as needed for anxiety., Disp: 12 tablet, Rfl: 0   morphine (MS CONTIN) 30 MG 12 hr tablet, Take 30 mg by mouth 2 (two) times daily as needed., Disp: , Rfl:    oxycodone (ROXICODONE) 30 MG immediate release tablet, Take 30 mg by mouth 4 (four) times daily as needed., Disp: , Rfl:    acetaminophen (TYLENOL) 325 MG tablet, Take 2  tablets (650  mg total) by mouth every 6 (six) hours as needed for mild pain (or Fever >/= 101). (Patient not taking: Reported on 07/23/2021), Disp: 20 tablet, Rfl: 0   amLODipine (NORVASC) 10 MG tablet, Take 1 tablet (10 mg total) by mouth daily. (Patient not taking: No sig reported), Disp: 90 tablet, Rfl: 1   amoxicillin-clavulanate (AUGMENTIN) 875-125 MG tablet, Take 1 tablet by mouth 2 (two) times daily., Disp: 60 tablet, Rfl: 3   aspirin EC 81 MG EC tablet, Take 1 tablet (81 mg total) by mouth daily. Swallow whole. (Patient not taking: No sig reported), Disp: 30 tablet, Rfl: 11   atorvastatin (LIPITOR) 40 MG tablet, Take 40 mg by mouth every evening.  (Patient not taking: No sig reported), Disp: , Rfl: 10   diclofenac Sodium (VOLTAREN) 1 % GEL, Apply 1 application topically daily as needed (pain). (Patient not taking: No sig reported), Disp: , Rfl:    ferrous sulfate 325 (65 FE) MG tablet, Take 1 tablet (325 mg total) by mouth daily. (Patient not taking: Reported on 03/27/2021), Disp: 30 tablet, Rfl: 1   folic acid (FOLVITE) 1 MG tablet, Take 1 tablet (1 mg total) by mouth daily. (Patient not taking: No sig reported), Disp: 30 tablet, Rfl: 0   gabapentin (NEURONTIN) 300 MG capsule, TAKE 3 CAPSULES BY MOUTH IN THE MORNING, 3 CAPSULES AT NOON, AND 4 CAPSULES AT BEDTIME FOR NERVE PAIN. (Patient not taking: No sig reported), Disp: 300 capsule, Rfl: 1   glipiZIDE (GLUCOTROL) 5 MG tablet, Take 1 tablet (5 mg total) by mouth 2 (two) times daily. (Patient not taking: No sig reported), Disp: 180 tablet, Rfl: 1   hydrALAZINE (APRESOLINE) 50 MG tablet, Take 1 tablet (50 mg total) by mouth every 8 (eight) hours. (Patient not taking: No sig reported), Disp: 90 tablet, Rfl: 0   lidocaine (XYLOCAINE) 5 % ointment, Apply topically 2 (two) times daily as needed. (Patient not taking: No sig reported), Disp: , Rfl:    lisinopril (ZESTRIL) 40 MG tablet, Take 1 tablet (40 mg total) by mouth at bedtime. (Patient not taking:  No sig reported), Disp: 30 tablet, Rfl: 0   metFORMIN (GLUCOPHAGE) 500 MG tablet, Take 1 tablet (500 mg total) by mouth 2 (two) times daily with a meal. (Patient not taking: No sig reported), Disp: 180 tablet, Rfl: 1   Multiple Vitamin (MULTIVITAMIN WITH MINERALS) TABS tablet, Take 1 tablet by mouth daily. (Patient not taking: No sig reported), Disp: , Rfl:    silver sulfADIAZINE (SILVADENE) 1 % cream, Apply pea-sized amount to wound daily. (Patient not taking: Reported on 07/23/2021), Disp: 50 g, Rfl: 0   thiamine 100 MG tablet, Take 1 tablet (100 mg total) by mouth daily. (Patient not taking: No sig reported), Disp: 30 tablet, Rfl: 0   Review of Systems  Constitutional:  Negative for activity change, appetite change, chills, diaphoresis, fatigue, fever and unexpected weight change.  HENT:  Negative for congestion, rhinorrhea, sinus pressure, sneezing, sore throat and trouble swallowing.   Eyes:  Negative for photophobia and visual disturbance.  Respiratory:  Negative for cough, chest tightness, shortness of breath, wheezing and stridor.   Cardiovascular:  Negative for chest pain, palpitations and leg swelling.  Gastrointestinal:  Negative for abdominal distention, abdominal pain, anal bleeding, blood in stool, constipation, diarrhea, nausea and vomiting.  Genitourinary:  Negative for difficulty urinating, dysuria, flank pain and hematuria.  Musculoskeletal:  Negative for arthralgias, back pain, gait problem, joint swelling and myalgias.  Skin:  Positive for wound. Negative  for color change, pallor and rash.  Neurological:  Negative for dizziness, tremors, weakness and light-headedness.  Hematological:  Negative for adenopathy. Does not bruise/bleed easily.  Psychiatric/Behavioral:  Positive for dysphoric mood. Negative for agitation, behavioral problems, confusion, decreased concentration and sleep disturbance.       Objective:   Physical Exam Constitutional:      Appearance: He is  well-developed.  HENT:     Head: Normocephalic and atraumatic.  Eyes:     Conjunctiva/sclera: Conjunctivae normal.  Cardiovascular:     Rate and Rhythm: Normal rate and regular rhythm.  Pulmonary:     Effort: Pulmonary effort is normal. No respiratory distress.     Breath sounds: No wheezing.  Abdominal:     General: There is no distension.     Palpations: Abdomen is soft.  Musculoskeletal:        General: No tenderness. Normal range of motion.     Cervical back: Normal range of motion and neck supple.  Skin:    General: Skin is warm and dry.     Coloration: Skin is not pale.     Findings: No rash.  Neurological:     General: No focal deficit present.     Mental Status: He is alert and oriented to person, place, and time.  Psychiatric:        Mood and Affect: Mood normal.        Behavior: Behavior normal.        Thought Content: Thought content normal.        Judgment: Judgment normal.    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  If all of these labs are encouraging I will stop his Augmentin and see him in 1 month's time.  If they are still not altogether sufficiently encouraging I will continue his antibiotics and see him again next month  Calls him: He is drinking 1 or 2 beers occasionally so is not completely sober but he is not drinking nearly as heavily as he was before.  Severe depression with suicidal ideation this is improved dramatically over time and he seems to have made use of a support system and those who genuinely love and care for him    Severe depression with prior suicidal ideation this is dramatically improved as well now.

## 2021-08-19 ENCOUNTER — Telehealth: Payer: Self-pay

## 2021-08-19 LAB — COMPLETE METABOLIC PANEL WITH GFR
AG Ratio: 1.4 (calc) (ref 1.0–2.5)
ALT: 18 U/L (ref 9–46)
AST: 12 U/L (ref 10–35)
Albumin: 3.9 g/dL (ref 3.6–5.1)
Alkaline phosphatase (APISO): 84 U/L (ref 35–144)
BUN: 14 mg/dL (ref 7–25)
CO2: 25 mmol/L (ref 20–32)
Calcium: 9.6 mg/dL (ref 8.6–10.3)
Chloride: 110 mmol/L (ref 98–110)
Creat: 1.01 mg/dL (ref 0.70–1.30)
Globulin: 2.8 g/dL (calc) (ref 1.9–3.7)
Glucose, Bld: 139 mg/dL — ABNORMAL HIGH (ref 65–99)
Potassium: 3.5 mmol/L (ref 3.5–5.3)
Sodium: 143 mmol/L (ref 135–146)
Total Bilirubin: 0.6 mg/dL (ref 0.2–1.2)
Total Protein: 6.7 g/dL (ref 6.1–8.1)
eGFR: 87 mL/min/{1.73_m2} (ref >=60)

## 2021-08-19 LAB — CBC WITH DIFFERENTIAL/PLATELET
Absolute Monocytes: 336 cells/uL (ref 200–950)
Basophils Absolute: 37 cells/uL (ref 0–200)
Basophils Relative: 0.5 %
Eosinophils Absolute: 307 cells/uL (ref 15–500)
Eosinophils Relative: 4.2 %
HCT: 32.8 % — ABNORMAL LOW (ref 38.5–50.0)
Hemoglobin: 11 g/dL — ABNORMAL LOW (ref 13.2–17.1)
Lymphs Abs: 2665 cells/uL (ref 850–3900)
MCH: 30.2 pg (ref 27.0–33.0)
MCHC: 33.5 g/dL (ref 32.0–36.0)
MCV: 90.1 fL (ref 80.0–100.0)
MPV: 10.3 fL (ref 7.5–12.5)
Monocytes Relative: 4.6 %
Neutro Abs: 3957 cells/uL (ref 1500–7800)
Neutrophils Relative %: 54.2 %
Platelets: 149 10*3/uL (ref 140–400)
RBC: 3.64 10*6/uL — ABNORMAL LOW (ref 4.20–5.80)
RDW: 13.8 % (ref 11.0–15.0)
Total Lymphocyte: 36.5 %
WBC: 7.3 10*3/uL (ref 3.8–10.8)

## 2021-08-19 LAB — SEDIMENTATION RATE: Sed Rate: 11 mm/h (ref 0–20)

## 2021-08-19 LAB — C-REACTIVE PROTEIN: CRP: 4.2 mg/L (ref <8.0)

## 2021-08-19 NOTE — Telephone Encounter (Signed)
Spoke with patient sister (home number listed) patient is to return my call from her phone as his does not have any minutes.    [place], CMA

## 2021-08-19 NOTE — Telephone Encounter (Signed)
-----   Message from Truman Hayward, MD sent at 08/19/2021  9:03 AM EDT ----- Eugene Diaz can stop his augmentin and I will see him in November off antibiotics ----- Message ----- From: Cheyenne Adas Lab Results In Sent: 08/18/2021  11:05 PM EDT To: Truman Hayward, MD

## 2021-08-23 NOTE — Telephone Encounter (Signed)
Spoke with patient's sister, asked that she have the patient call the office at his earliest convenience.   Beryle Flock, RN

## 2021-08-24 NOTE — Telephone Encounter (Signed)
Attempted to call patient again - no VM box set up to leave a message.

## 2021-08-25 NOTE — Telephone Encounter (Signed)
Left VM asking patient to return my call.   Myrtle Barnhard P Anquinette Pierro, CMA  

## 2021-08-26 NOTE — Telephone Encounter (Signed)
Called patient, no answer and unable to leave voicemail.   Beryle Flock, RN

## 2021-08-27 NOTE — Telephone Encounter (Signed)
Staff contacted patient again, we have left several voicemail for the past week and have not been able to reach patient.  Eugene Diaz

## 2021-08-31 ENCOUNTER — Ambulatory Visit: Payer: Medicare Other | Admitting: Podiatry

## 2021-09-03 ENCOUNTER — Ambulatory Visit (INDEPENDENT_AMBULATORY_CARE_PROVIDER_SITE_OTHER): Payer: Medicare Other | Admitting: Podiatry

## 2021-09-03 ENCOUNTER — Other Ambulatory Visit: Payer: Self-pay

## 2021-09-03 DIAGNOSIS — L97512 Non-pressure chronic ulcer of other part of right foot with fat layer exposed: Secondary | ICD-10-CM

## 2021-09-03 DIAGNOSIS — L97511 Non-pressure chronic ulcer of other part of right foot limited to breakdown of skin: Secondary | ICD-10-CM | POA: Diagnosis not present

## 2021-09-03 DIAGNOSIS — R234 Changes in skin texture: Secondary | ICD-10-CM

## 2021-09-03 NOTE — Progress Notes (Signed)
  Subjective:  Patient ID: Eugene Diaz, male    DOB: [birth year],  MRN: [id number]  Chief Complaint  Patient presents with   WOUND     WOUND CARE     DOS: 04/01/21 Procedures:             1) incision and drainage of right foot abscess, complex             2) partial first ray resection right  DOS: 03/29/21 Procedures:             1) Right foot wound debridement and closure             2) Bone biopsy  55 y.o. male presents with the above complaint. History confirmed with patient. Presents today without a dressing to the right foot - with dirt and debris to the plantar aspect of the right foot.  Objective:  Physical Exam: no tenderness at the surgical site, local edema noted, and calf supple, nontender. Foot with crusted dirt, Small 2nd toe PIPJ ulceration with hammertoe, no warmth, erythema, SOI. Incision: 1.5 cm fissure remaining with granular base, light ss drainage. No warmth, no erythema, no SOI.    Assessment:   1. Ulcer of right foot, limited to breakdown of skin ([place])   2. Ulcer of right foot with fat layer exposed ([place])   3. Skin fissure      Plan:  Patient was evaluated and treated and all questions answered.  Post-operative State -Discussed the importance of daily dressing changes, not walking barefoot. Both wounds minimally debrided today. Dressed with silvadene and DSD. Patient instructed to do so daily. -Advised to present promptly should wound worsen with worsening drainage, warmth, redness  Return in about 3 weeks (around 09/24/2021) for Wound Care.

## 2021-09-17 ENCOUNTER — Ambulatory Visit (INDEPENDENT_AMBULATORY_CARE_PROVIDER_SITE_OTHER): Payer: Medicare Other | Admitting: Infectious Disease

## 2021-09-17 ENCOUNTER — Other Ambulatory Visit: Payer: Self-pay

## 2021-09-17 VITALS — BP 194/92 | HR 75 | Temp 98.6°F | Wt 263.0 lb

## 2021-09-17 DIAGNOSIS — F1010 Alcohol abuse, uncomplicated: Secondary | ICD-10-CM

## 2021-09-17 DIAGNOSIS — R45851 Suicidal ideations: Secondary | ICD-10-CM

## 2021-09-17 DIAGNOSIS — F322 Major depressive disorder, single episode, severe without psychotic features: Secondary | ICD-10-CM | POA: Diagnosis not present

## 2021-09-17 DIAGNOSIS — L089 Local infection of the skin and subcutaneous tissue, unspecified: Secondary | ICD-10-CM | POA: Diagnosis not present

## 2021-09-17 DIAGNOSIS — M86172 Other acute osteomyelitis, left ankle and foot: Secondary | ICD-10-CM | POA: Diagnosis present

## 2021-09-17 DIAGNOSIS — E11628 Type 2 diabetes mellitus with other skin complications: Secondary | ICD-10-CM | POA: Diagnosis not present

## 2021-09-17 NOTE — Progress Notes (Signed)
Subjective:    Chief complaint: Follow-up for osteomyelitis and diabetic foot ulcer   Patient ID: Eugene Diaz, male    DOB: Oct 31, 1966, 55 y.o.   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  He had additional further debridement as well with bone biopsy of the first metatarsal and wound closure.  He grew E. Coli group B streptococcus as well as beta-lactamase producing Prevotella.  He was narrowed to intravenous cefazolin with oral metronidazole.  He was counseled not to drink alcohol while on the metronidazole and he claims he is not taking the metronidazole.  We had report from his home health company that the cefazolin which his sister's been giving him is only been being given twice daily.   Several visits ago we had discontinued his IV therapy and I placed him on Augmentin.  Since then he has seen [name] twice.  Several visits ago he had stopped taking his Augmentin but we are able to convince him to start this again.  At that time he was suffering from significant depressive symptoms including passive suicidal ideation.   In the interim he had improved dramatically from a emotional standpoint.  He had stayed away from alcohol altogether for 3 weeks.  He was taking his Augmentin and has seen [name] in follow-up who is happy with how his wound appears.  His inflammatory markers have also improved in the interim.   My last visit it was  my intention that he stopped the Augmentin--but he himself is not sure if he is stopped it.  He said he had filled several prescriptions and he does  not know if Augmentin is one of them.         Past Medical History:  Diagnosis Date   Alcohol abuse    Alcoholic peripheral neuropathy ([place])    Anxiety    Arthritis    Asthma    followed by pcp   B12 deficiency    Depression    Diabetic neuropathy ([place])    Edema of both lower extremities    Essential tremor    neurologist-- dr patel--- due to alcohol abuse   GERD (gastroesophageal reflux disease)    History of cellulitis 2020   left lower leg;   recurrent 03-25-2020   History of esophageal stricture    s/p  dilatation 02-13-2018   History of osteomyelitis 2020   left great toe     Hypercholesterolemia    Hypertension    followed by pcp  (nuclear stress test 03-11-2014 low risk w/ no ischemia, ef 65%   Normocytic anemia    followed by pcp   (03-18-2020 had transfusion's 02/ 2021)   Osteomyelitis of great toe of right foot ([place]) 08/18/2021   Peripheral vascular disease ([place])    Severe depression ([place]) 06/09/2021   Status post incision and drainage followed by Dr [name] and pcp   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   (03-17-2020  pt states most of incision is healed with exception an area that is still draining,  daily dressing change),  foot is red but not warm to the touch and has swelling  but has improved)   Suicidal ideation 06/09/2021   Thrombocytopenia ([place])    chronic   Type 2 diabetes mellitus ([place])    followed by pcp---    (03-18-2020  pt stated does not check blood sugar)    Past Surgical History:  Procedure Laterality Date   AMPUTATION Left 10/16/2019   Procedure: Left partial second ray resection; placement of antibiotic beads;  Surgeon: Evelina Bucy, DPM;  [facility]: [facility];  Service: Podiatry;  Laterality: Left;   AMPUTATION Left 11/13/2019   Procedure: Left Midfoot Amputation - Transmetatarsal vs. Lisfranc; Placement antibiotic beads;  Surgeon: Evelina Bucy, DPM;  Location: [facility];  Service: Podiatry;  Laterality: Left;   AMPUTATION Left  12/21/2019   Procedure: Chopart Amputation left foot;  Surgeon: Evelina Bucy, DPM;  Location: [place];  Service: Podiatry;  Laterality: Left;   AMPUTATION Left 06/24/2020   Procedure: LEFT BELOW KNEE AMPUTATION;  Surgeon: Newt Minion, MD;  Location: [place];  Service: Orthopedics;  Laterality: Left;   AMPUTATION Right 03/29/2021   Procedure: AMPUTATION RAY;  Surgeon: Evelina Bucy, DPM;  Location: [place];  Service: Podiatry;  Laterality: Right;   AMPUTATION TOE Left 08/14/2019   Procedure: AMPUTATION GREAT TOE;  Surgeon: Evelina Bucy, DPM;  [facility];  Service: Podiatry;  Laterality: Left;   APPLICATION OF WOUND VAC Left 12/21/2019   Procedure: Application Of Wound Vac;  Surgeon: Evelina Bucy, DPM;  Location: [place];  Service: Podiatry;  Laterality: Left;   BIOPSY  02/13/2018   Procedure: BIOPSY;  Surgeon: Danie Binder, MD;  [facility];  Service: Endoscopy;;  transverse colon biopsy, gastric biopsy   COLONOSCOPY WITH PROPOFOL N/A 02/13/2018   Procedure: COLONOSCOPY WITH PROPOFOL;  Surgeon: Danie Binder, MD;  [facility];  Service: Endoscopy;  Laterality: N/A;  11:15am   ESOPHAGOGASTRODUODENOSCOPY (EGD) WITH PROPOFOL N/A 02/13/2018   Procedure: ESOPHAGOGASTRODUODENOSCOPY (EGD) WITH PROPOFOL;  Surgeon: Danie Binder, MD;  [facility];  Service: Endoscopy;  Laterality: N/A;   I & D EXTREMITY Left 07/16/2019   Procedure: Insicion  AND DEBRIDEMENT EXTREMITY;  Surgeon: Evelina Bucy, DPM;  Location: [place];  Service: Podiatry;  Laterality: Left;   I & D EXTREMITY Left 06/22/2020   Procedure: IRRIGATION AND DEBRIDEMENT EXTREMITY, left foot and ankle;  Surgeon: Evelina Bucy, DPM;  Location: [place];  Service: Podiatry;  Laterality: Left;   I & D EXTREMITY Right 03/29/2021   Procedure: IRRIGATION AND DEBRIDEMENT EXTREMITY;  Surgeon: Evelina Bucy, DPM;  Location: [place];  Service: Podiatry;  Laterality: Right;   INCISION AND DRAINAGE OF WOUND  Left 10/13/2019   Procedure: IRRIGATION AND DEBRIDEMENT WOUND OF LEFT FOOT AND FIRST METATARSAL RESECTION;  Surgeon: Evelina Bucy, DPM;  [facility];  Service: Podiatry;  Laterality: Left;   IRRIGATION AND DEBRIDEMENT FOOT Left 11/11/2019   Procedure: Left Foot Wound Irrigation and Debridement;  Surgeon: Evelina Bucy, DPM;  [facility];  Service: Podiatry;  Laterality: Left;   Left arm     Left arm repair (tendon and artery)   PERCUTANEOUS PINNING  07/16/2019   Procedure: Open Reduction Percutaneous Pinning Extremity;  Surgeon: Evelina Bucy, DPM;  Location: [place];  Service: Podiatry;;   PILONIDAL CYST EXCISION N/A 08/08/2014   Procedure: CYST EXCISION PILONIDAL EXTENSIVE;  Surgeon: Jamesetta So, MD;  [facility];  Service: General;  Laterality: N/A;   POLYPECTOMY  02/13/2018  Procedure: POLYPECTOMY;  Surgeon: Danie Binder, MD;  [facility];  Service: Endoscopy;;  transverse colon polyp hs, rectal polyps times 2   SAVORY DILATION N/A 02/13/2018   Procedure: SAVORY DILATION;  Surgeon: Danie Binder, MD;  [facility];  Service: Endoscopy;  Laterality: N/A;   WOUND DEBRIDEMENT Left 09/04/2019   Procedure: DEBRIDEMENT WOUND WITH COMPLEX  REPAIR OF DEHISCENCE;  Surgeon: Evelina Bucy, DPM;  Location: [place];  Service: Podiatry;  Laterality: Left;  Leave patient on stretcher   WOUND DEBRIDEMENT Left 10/16/2019   Procedure: Left foot wound debridement and closure;  Surgeon: Evelina Bucy, DPM;  [facility];  Service: Podiatry;  Laterality: Left;   WOUND DEBRIDEMENT Left 12/18/2019   Procedure: LEFT FOOT DEBRIDEMENT WITH PARTIAL INCISION OF INFECTED BONE;  Surgeon: Evelina Bucy, DPM;  Location: [place];  Service: Podiatry;  Laterality: Left;  LEFT FOOT DEBRIDEMENT WITH PARTIAL INCISION OF INFECTED BONE   WOUND DEBRIDEMENT Right 04/01/2021   Procedure: Right foot wound debridement and irrigation and closure, bone biopsy;   Surgeon: Evelina Bucy, DPM;  Location: [place];  Service: Podiatry;  Laterality: Right;    Family History  Problem Relation Age of Onset   Heart attack Mother        Living, 75   Cancer Father        Deceased   Healthy Brother    Healthy Sister    Colon cancer Neg Hx    Colon polyps Neg Hx       Social History   Socioeconomic History   Marital status: Widowed    Spouse name: Not on file   Number of children: 0   Years of education: Not on file   Highest education level: 8th grade  Occupational History   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  Tobacco Use   Smoking status: Former    Packs/day: 2.50    Years: 25.00    Pack years: 62.50    Types: Cigarettes    Quit date: 08/05/1984    Years since quitting: 37.1   Smokeless tobacco: Never  Vaping Use   Vaping Use: Never used  Substance and Sexual Activity   Alcohol use: Yes    Alcohol/week: 50.0 standard drinks    Types: 50 Standard drinks or equivalent per week    Comment: Drinks 6 beers, liquor 4-6oz (both daily) x 35 years   Drug use: Yes    Types: Marijuana   Sexual activity: Yes    Birth control/protection: None  Other Topics Concern   Not on file  Social History Narrative   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 Highest level of education:  8th grade   He lives with wife.  No children.   Left handed   Drinks soda, water-no coffee, no tea   Social Determinants of Health   Financial Resource Strain: Not on file  Food Insecurity: Not on file  Transportation Needs: Not on file  Physical Activity: Not on file  Stress: Not on file  Social Connections: Not on file    No Known Allergies   Current Outpatient Medications:    ALPRAZolam (XANAX) 1 MG tablet, Take 0.5 tablets (0.5 mg total) by mouth 4 (four) times daily as needed for anxiety., Disp: 12 tablet, Rfl: 0   morphine (MS CONTIN) 30 MG 12 hr tablet, Take 30 mg by mouth 2  (two)  times daily as needed., Disp: , Rfl:    oxycodone (ROXICODONE) 30 MG immediate release tablet, Take 30 mg by mouth 4 (four) times daily as needed., Disp: , Rfl:    acetaminophen (TYLENOL) 325 MG tablet, Take 2 tablets (650 mg total) by mouth every 6 (six) hours as needed for mild pain (or Fever >/= 101). (Patient not taking: Reported on 07/23/2021), Disp: 20 tablet, Rfl: 0   amLODipine (NORVASC) 10 MG tablet, Take 1 tablet (10 mg total) by mouth daily. (Patient not taking: No sig reported), Disp: 90 tablet, Rfl: 1   amoxicillin-clavulanate (AUGMENTIN) 875-125 MG tablet, Take 1 tablet by mouth 2 (two) times daily., Disp: 60 tablet, Rfl: 3   aspirin EC 81 MG EC tablet, Take 1 tablet (81 mg total) by mouth daily. Swallow whole. (Patient not taking: No sig reported), Disp: 30 tablet, Rfl: 11   atorvastatin (LIPITOR) 40 MG tablet, Take 40 mg by mouth every evening.  (Patient not taking: No sig reported), Disp: , Rfl: 10   diclofenac Sodium (VOLTAREN) 1 % GEL, Apply 1 application topically daily as needed (pain). (Patient not taking: No sig reported), Disp: , Rfl:    ferrous sulfate 325 (65 FE) MG tablet, Take 1 tablet (325 mg total) by mouth daily. (Patient not taking: Reported on 03/27/2021), Disp: 30 tablet, Rfl: 1   folic acid (FOLVITE) 1 MG tablet, Take 1 tablet (1 mg total) by mouth daily. (Patient not taking: No sig reported), Disp: 30 tablet, Rfl: 0   gabapentin (NEURONTIN) 300 MG capsule, TAKE 3 CAPSULES BY MOUTH IN THE MORNING, 3 CAPSULES AT NOON, AND 4 CAPSULES AT BEDTIME FOR NERVE PAIN. (Patient not taking: No sig reported), Disp: 300 capsule, Rfl: 1   glipiZIDE (GLUCOTROL) 5 MG tablet, Take 1 tablet (5 mg total) by mouth 2 (two) times daily. (Patient not taking: No sig reported), Disp: 180 tablet, Rfl: 1   hydrALAZINE (APRESOLINE) 50 MG tablet, Take 1 tablet (50 mg total) by mouth every 8 (eight) hours. (Patient not taking: No sig reported), Disp: 90 tablet, Rfl: 0   lidocaine (XYLOCAINE) 5 %  ointment, Apply topically 2 (two) times daily as needed. (Patient not taking: No sig reported), Disp: , Rfl:    lisinopril (ZESTRIL) 40 MG tablet, Take 1 tablet (40 mg total) by mouth at bedtime. (Patient not taking: No sig reported), Disp: 30 tablet, Rfl: 0   metFORMIN (GLUCOPHAGE) 500 MG tablet, Take 1 tablet (500 mg total) by mouth 2 (two) times daily with a meal. (Patient not taking: No sig reported), Disp: 180 tablet, Rfl: 1   Multiple Vitamin (MULTIVITAMIN WITH MINERALS) TABS tablet, Take 1 tablet by mouth daily. (Patient not taking: No sig reported), Disp: , Rfl:    silver sulfADIAZINE (SILVADENE) 1 % cream, Apply pea-sized amount to wound daily. (Patient not taking: Reported on 07/23/2021), Disp: 50 g, Rfl: 0   thiamine 100 MG tablet, Take 1 tablet (100 mg total) by mouth daily. (Patient not taking: No sig reported), Disp: 30 tablet, Rfl: 0   Review of Systems  Constitutional:  Negative for activity change, appetite change, chills, diaphoresis, fatigue, fever and unexpected weight change.  HENT:  Negative for congestion, rhinorrhea, sinus pressure, sneezing, sore throat and trouble swallowing.   Eyes:  Negative for photophobia and visual disturbance.  Respiratory:  Negative for cough, chest tightness, shortness of breath, wheezing and stridor.   Cardiovascular:  Negative for chest pain, palpitations and leg swelling.  Gastrointestinal:  Negative for abdominal distention, abdominal pain,  anal bleeding, blood in stool, constipation, diarrhea, nausea and vomiting.  Genitourinary:  Negative for difficulty urinating, dysuria, flank pain and hematuria.  Musculoskeletal:  Negative for arthralgias, back pain, gait problem, joint swelling and myalgias.  Skin:  Positive for wound. Negative for color change, pallor and rash.  Neurological:  Negative for dizziness, tremors, weakness and light-headedness.  Hematological:  Negative for adenopathy. Does not bruise/bleed easily.  Psychiatric/Behavioral:   Negative for agitation, behavioral problems, confusion, decreased concentration, dysphoric mood and sleep disturbance.       Objective:   Physical Exam Constitutional:      Appearance: He is well-developed.  HENT:     Head: Normocephalic and atraumatic.  Eyes:     Conjunctiva/sclera: Conjunctivae normal.  Cardiovascular:     Rate and Rhythm: Normal rate and regular rhythm.  Pulmonary:     Effort: Pulmonary effort is normal. No respiratory distress.     Breath sounds: No wheezing.  Abdominal:     General: There is no distension.     Palpations: Abdomen is soft.  Musculoskeletal:        General: Normal range of motion.     Cervical back: Normal range of motion and neck supple.  Skin:    General: Skin is warm and dry.     Coloration: Skin is not pale.  Neurological:     General: No focal deficit present.     Mental Status: He is alert and oriented to person, place, and time.  Psychiatric:        Mood and Affect: Mood normal.        Behavior: Behavior normal.        Thought Content: Thought content normal.        Judgment: Judgment normal.    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  I will see him again in 1 month's time this time off antibiotic   He will continue to follow closely podiatry  Alcoholism he is continue to maintain sobriety he is worried about his ability to stay off alcohol long-term and about the dangers of staying off alcohol I told the main dangers of not drinking in the context of stopping alcohol abruptly or in the first 3 days of alcohol  cessation and not now where he is.  Certainly will be at risk over the holidays for relapse  Severe depression with prior suicidal ideation: Much of this was driven by his grief at the loss of his wife.  He will need to be really mindful over the holidays since this can precipitate some of his grief.

## 2021-09-17 NOTE — Patient Instructions (Signed)
When you go home look for Amoxicillin/Clavunic acid (Augmentin) and if you have been still taking this please STOP this

## 2021-09-18 LAB — BASIC METABOLIC PANEL WITH GFR
BUN: 14 mg/dL (ref 7–25)
CO2: 27 mmol/L (ref 20–32)
Calcium: 9.4 mg/dL (ref 8.6–10.3)
Chloride: 106 mmol/L (ref 98–110)
Creat: 0.98 mg/dL (ref 0.70–1.30)
Glucose, Bld: 147 mg/dL — ABNORMAL HIGH (ref 65–99)
Potassium: 3.7 mmol/L (ref 3.5–5.3)
Sodium: 140 mmol/L (ref 135–146)
eGFR: 91 mL/min/{1.73_m2} (ref >=60)

## 2021-09-18 LAB — CBC WITH DIFFERENTIAL/PLATELET
Absolute Monocytes: 456 cells/uL (ref 200–950)
Basophils Absolute: 61 cells/uL (ref 0–200)
Basophils Relative: 0.8 %
Eosinophils Absolute: 220 cells/uL (ref 15–500)
Eosinophils Relative: 2.9 %
HCT: 36.7 % — ABNORMAL LOW (ref 38.5–50.0)
Hemoglobin: 12.3 g/dL — ABNORMAL LOW (ref 13.2–17.1)
Lymphs Abs: 2888 cells/uL (ref 850–3900)
MCH: 30.1 pg (ref 27.0–33.0)
MCHC: 33.5 g/dL (ref 32.0–36.0)
MCV: 89.7 fL (ref 80.0–100.0)
MPV: 10.4 fL (ref 7.5–12.5)
Monocytes Relative: 6 %
Neutro Abs: 3975 cells/uL (ref 1500–7800)
Neutrophils Relative %: 52.3 %
Platelets: 197 10*3/uL (ref 140–400)
RBC: 4.09 10*6/uL — ABNORMAL LOW (ref 4.20–5.80)
RDW: 13.4 % (ref 11.0–15.0)
Total Lymphocyte: 38 %
WBC: 7.6 10*3/uL (ref 3.8–10.8)

## 2021-09-18 LAB — C-REACTIVE PROTEIN: CRP: 1.7 mg/L (ref <8.0)

## 2021-09-18 LAB — SEDIMENTATION RATE: Sed Rate: 9 mm/h (ref 0–20)

## 2021-09-28 ENCOUNTER — Ambulatory Visit: Payer: Medicare Other | Admitting: Podiatry

## 2021-10-08 ENCOUNTER — Ambulatory Visit (INDEPENDENT_AMBULATORY_CARE_PROVIDER_SITE_OTHER): Payer: Medicare Other | Admitting: Podiatry

## 2021-10-08 ENCOUNTER — Other Ambulatory Visit: Payer: Self-pay

## 2021-10-08 DIAGNOSIS — L97511 Non-pressure chronic ulcer of other part of right foot limited to breakdown of skin: Secondary | ICD-10-CM

## 2021-10-08 DIAGNOSIS — R234 Changes in skin texture: Secondary | ICD-10-CM | POA: Diagnosis not present

## 2021-10-08 NOTE — Progress Notes (Signed)
  Subjective:  Patient ID: Eugene Diaz, male    DOB: [birth year],  MRN: [id number]  Chief Complaint  Patient presents with   Foot Ulcer     [facility] wound care right foot    DOS: 04/01/21 Procedures:             1) incision and drainage of right foot abscess, complex             2) partial first ray resection right  DOS: 03/29/21 Procedures:             1) Right foot wound debridement and closure             2) Bone biopsy  55 y.o. male presents with the above complaint. History confirmed with patient. Wearing a sneaker today and his prosthesis on Left leg. Using crutches. Objective:  Physical Exam: no tenderness at the surgical site, local edema noted, and calf supple, nontender. Foot with crusted dirt, Small 2nd toe PIPJ HPK with hammertoe, no open ulceration, no warmth, erythema, SOI. Incision: Wound fully healed with mild HPK No warmth, no erythema, no SOI.    Assessment:   1. Ulcer of right foot, limited to breakdown of skin ([place])   2. Skin fissure       Plan:  Patient was evaluated and treated and all questions answered.  Hx of ulcer right 1st met / 2nd toe area -Minimal debridement of HPK. No open wound noted. Mild scab/abrasions from scraping his foot but appear almost fully healed. He does have some dorsal 2nd PIPJ callus. We discussed the importance of watching this area. I am concerned that he may have further issues as he does not fully care for his foot as he should and does not always wear shoegear. For this reason will check in 2 months to ensure no other issues have arisen and his ulcers remain healed.   Return in about 2 months (around 12/09/2021) for Wound Check.

## 2021-10-13 ENCOUNTER — Ambulatory Visit: Payer: Medicare Other | Admitting: Infectious Disease

## 2021-10-26 ENCOUNTER — Ambulatory Visit (INDEPENDENT_AMBULATORY_CARE_PROVIDER_SITE_OTHER): Payer: Medicare Other | Admitting: Internal Medicine

## 2021-10-26 ENCOUNTER — Other Ambulatory Visit: Payer: Self-pay

## 2021-10-26 ENCOUNTER — Encounter: Payer: Self-pay | Admitting: Internal Medicine

## 2021-10-26 VITALS — BP 172/89 | HR 97 | Temp 98.5°F

## 2021-10-26 DIAGNOSIS — F1010 Alcohol abuse, uncomplicated: Secondary | ICD-10-CM | POA: Diagnosis not present

## 2021-10-26 DIAGNOSIS — M869 Osteomyelitis, unspecified: Secondary | ICD-10-CM

## 2021-10-26 DIAGNOSIS — E43 Unspecified severe protein-calorie malnutrition: Secondary | ICD-10-CM

## 2021-10-26 DIAGNOSIS — E1169 Type 2 diabetes mellitus with other specified complication: Secondary | ICD-10-CM | POA: Diagnosis present

## 2021-10-27 ENCOUNTER — Encounter: Payer: Self-pay | Admitting: Internal Medicine

## 2021-10-27 LAB — SEDIMENTATION RATE: Sed Rate: 28 mm/h — ABNORMAL HIGH (ref 0–20)

## 2021-10-27 LAB — C-REACTIVE PROTEIN: CRP: 41.6 mg/L — ABNORMAL HIGH (ref <8.0)

## 2021-10-27 NOTE — Assessment & Plan Note (Signed)
At this time, no overt concerns for ongoing infection clinically now off of antibiotics.  Inflammatory markers checked for comparison but no indication for treatment at this time and can continue to monitor off of antibiotics.  He can return as needed.

## 2021-10-27 NOTE — Progress Notes (Signed)
° °  Subjective:    Patient ID: Eugene Diaz, male    DOB: 08/06/1966, 55 y.o.   MRN: [id number]  HPI Here for follow up of right foot osteomyelitis. He has a history of smoking and alcohol abuse with peripheral neuropathy and left BKA and recently with right first metatarsal osteomyelitis s/p first ray partial amputation.  This became infected and required further debridement and polymicrobial in culture.  He was treated with 6 weeks of IV antibiotics and followed by oral amoxicillin/clavulanate until he was seen in November.  He comes in now one month off of antibiotics.  He denies any specific new complaints, no rash, no diarrhea.  No concerns with his right foot.  See detailed note from [name] 09/17/21   Review of Systems  Constitutional:  Negative for chills, fatigue and fever.  Gastrointestinal:  Negative for diarrhea and nausea.  Skin:  Negative for rash.      Objective:   Physical Exam Eyes:     General: No scleral icterus. Pulmonary:     Effort: Pulmonary effort is normal.  Musculoskeletal:     Comments: Right foot at 1st metatarsal area closed, no drainage, no swelling, no erythema.   Picture in media  Neurological:     Mental Status: He is alert.    SH: no alcohol or tobacco now      Assessment & Plan:

## 2021-10-27 NOTE — Assessment & Plan Note (Signed)
Encourage good protein for wound healing.

## 2021-10-27 NOTE — Assessment & Plan Note (Signed)
He has been alcohol free and I encouraged continued efforts at remaining off alcohol

## 2021-11-08 ENCOUNTER — Other Ambulatory Visit: Payer: Self-pay

## 2021-11-08 ENCOUNTER — Emergency Department (HOSPITAL_COMMUNITY): Payer: Medicare Other

## 2021-11-08 ENCOUNTER — Encounter (HOSPITAL_COMMUNITY): Payer: Self-pay

## 2021-11-08 ENCOUNTER — Emergency Department (HOSPITAL_COMMUNITY)
Admission: EM | Admit: 2021-11-08 | Discharge: 2021-11-08 | Disposition: A | Discharge status: Home / Self Care | Payer: Medicare Other | Attending: Emergency Medicine | Admitting: Emergency Medicine

## 2021-11-08 DIAGNOSIS — Z7982 Long term (current) use of aspirin: Secondary | ICD-10-CM | POA: Diagnosis not present

## 2021-11-08 DIAGNOSIS — E119 Type 2 diabetes mellitus without complications: Secondary | ICD-10-CM | POA: Diagnosis not present

## 2021-11-08 DIAGNOSIS — Z79899 Other long term (current) drug therapy: Secondary | ICD-10-CM | POA: Insufficient documentation

## 2021-11-08 DIAGNOSIS — Z7984 Long term (current) use of oral hypoglycemic drugs: Secondary | ICD-10-CM | POA: Diagnosis not present

## 2021-11-08 DIAGNOSIS — M25561 Pain in right knee: Secondary | ICD-10-CM

## 2021-11-08 NOTE — ED Provider Notes (Signed)
[facility] EMERGENCY DEPARTMENT Provider Note   CSN: [id number] Arrival date & time: 11/08/21  0920     History  Chief Complaint  Patient presents with   Knee Pain    Eugene Diaz is a 56 y.o. male.  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  Patient states that started about 3 days ago,  he states he has pain while he was try to walk up his ramp, he is unsure if he hit it or if he twisted it, patient states pain has remained consistent, worse with movements does not improve much with rest, states pain remains mainly on his right knee, he has no associate paresthesia or weakness moving down his leg, no calf tenderness, no leg swelling, he denies any chest pain or shortness of breath, no fevers or chills.  He states he is not taking anything for pain medication, is never had this in the past.  After reviewing patient's chart patient unfortunately had osteomyelitis of the right great toe he was on vancomycin for 6 weeks time transition to Augmentin and complete the course about 1 month ago, is currently not on any antibiotics at this time followed by infectious disease and has been cleared by them.  Home Medications Prior to Admission medications   Medication Sig Start Date End Date Taking? Authorizing Provider  acetaminophen (TYLENOL) 325 MG tablet Take 2 tablets (650 mg total) by mouth every 6 (six) hours as needed for mild pain (or Fever >/= 101). Patient not taking: Reported on 07/23/2021 12/27/19   Arrien, [name], MD  ALPRAZolam Duanne Moron) 1 MG tablet Take 0.5 tablets (0.5 mg total) by mouth 4 (four) times daily as needed for anxiety. Patient not taking: Reported on 10/26/2021 01/21/20   Caren Griffins, MD  amLODipine (NORVASC) 10 MG tablet Take 1 tablet (10 mg total) by mouth  daily. Patient not taking: Reported on 06/09/2021 04/03/21   Mercy Riding, MD  amoxicillin-clavulanate (AUGMENTIN) 875-125 MG tablet Take 1 tablet by mouth 2 (two) times daily. Patient not taking: Reported on 10/26/2021 07/23/21   Tommy Medal, Lavell Islam, MD  aspirin EC 81 MG EC tablet Take 1 tablet (81 mg total) by mouth daily. Swallow whole. Patient not taking: Reported on 05/05/2021 04/03/21   Mercy Riding, MD  atorvastatin (LIPITOR) 40 MG tablet Take 40 mg by mouth every evening.  Patient not taking: Reported on 03/27/2021 12/17/15   [provider]  diclofenac Sodium (VOLTAREN) 1 % GEL Apply 1 application topically daily as needed (pain). Patient not taking: Reported on 05/05/2021    [provider]  ferrous sulfate 325 (65 FE) MG tablet Take 1 tablet (325 mg total) by mouth daily. Patient not taking: Reported on 03/27/2021 11/15/19 11/14/20  Shelly Coss, MD  folic acid (FOLVITE) 1 MG tablet Take 1 tablet (1 mg total) by mouth daily. Patient not taking: Reported on 03/27/2021 07/11/20   Love, Ivan Anchors, PA-C  gabapentin (NEURONTIN) 300 MG capsule TAKE 3 CAPSULES BY MOUTH IN THE MORNING, 3 CAPSULES AT NOON, AND 4 CAPSULES AT BEDTIME FOR NERVE PAIN. Patient not taking: Reported on 03/27/2021 07/03/20   Narda Amber K, DO  glipiZIDE (GLUCOTROL) 5 MG tablet Take 1 tablet (5 mg total) by mouth 2 (two) times daily. Patient not taking: No sig reported 04/02/21 09/29/21  Mercy Riding, MD  hydrALAZINE (APRESOLINE) 50 MG tablet Take 1 tablet (50 mg total) by mouth every 8 (eight) hours. Patient not taking: Reported on 03/27/2021 07/10/20   Love, Ivan Anchors, PA-C  lidocaine (XYLOCAINE) 5 % ointment Apply topically 2 (two) times daily as needed. Patient not taking: Reported on 06/09/2021 03/16/21   [provider]  lisinopril (ZESTRIL) 40 MG tablet Take 1 tablet (40 mg total) by mouth at bedtime. Patient not taking: Reported on 06/09/2021 07/10/20   Love, Ivan Anchors, PA-C  metFORMIN (GLUCOPHAGE) 500  MG tablet Take 1 tablet (500 mg total) by mouth 2 (two) times daily with a meal. Patient not taking: No sig reported 04/02/21 09/29/21  Mercy Riding, MD  morphine (MS CONTIN) 30 MG 12 hr tablet Take 30 mg by mouth 2 (two) times daily as needed. Patient not taking: Reported on 10/26/2021 08/16/21   [provider]  Multiple Vitamin (MULTIVITAMIN WITH MINERALS) TABS tablet Take 1 tablet by mouth daily. Patient not taking: Reported on 03/27/2021 07/11/20   Love, Ivan Anchors, PA-C  oxycodone (ROXICODONE) 30 MG immediate release tablet Take 30 mg by mouth 4 (four) times daily as needed. Patient not taking: Reported on 10/26/2021 08/16/21   [provider]  silver sulfADIAZINE (SILVADENE) 1 % cream Apply pea-sized amount to wound daily. Patient not taking: Reported on 07/23/2021 06/11/21   Evelina Bucy, DPM  thiamine 100 MG tablet Take 1 tablet (100 mg total) by mouth daily. Patient not taking: Reported on 03/27/2021 07/11/20   Bary Leriche, PA-C      Allergies    Patient has no known allergies.    Review of Systems   Review of Systems  Constitutional:  Negative for chills and fever.  Respiratory:  Negative for shortness of breath.   Cardiovascular:  Negative for chest pain.  Gastrointestinal:  Negative for abdominal pain.  Musculoskeletal:        Right knee pain.  Neurological:  Negative for headaches.   Physical Exam Updated Vital Signs BP 121/90    Pulse 77    Temp 98.1 F (36.7 C) (Oral)    Resp 18    Ht 6\' 2"  (1.88 m)    Wt 127 kg    SpO2 100%    BMI 35.95 kg/m  Physical Exam Vitals and nursing note reviewed.  Constitutional:      General: He is not in acute distress.    Appearance: He is not ill-appearing.  HENT:     Head: Normocephalic and atraumatic.     Nose: No congestion.  Eyes:     Conjunctiva/sclera: Conjunctivae normal.  Cardiovascular:     Rate and Rhythm: Normal rate and regular rhythm.     Pulses: Normal pulses.     Heart sounds: No murmur  heard.   No friction rub. No gallop.  Pulmonary:     Effort: No respiratory distress.     Breath sounds: No wheezing, rhonchi or rales.  Musculoskeletal:     Comments: Noted left leg amputation below the knee, right leg was visualized and slight erythema over the right patella but there is no edema or other gross abnormalities present, skin was fully intact, it was not warm to the touch no fluctuance or induration present, no joint laxity present in the knee, tenderness is mainly along the patella on the proximal end, he has full range of motion, 5 5 strength in the ankle knee and hip.  Neurovascular is fully intact, there is  no calf tenderness no palpable cords.  Skin:    General: Skin is warm and dry.  Neurological:     Mental Status: He is alert.  Psychiatric:        Mood and Affect: Mood normal.    ED Results / Procedures / Treatments   Labs (all labs ordered are listed, but only abnormal results are displayed) Labs Reviewed - No data to display  EKG None  Radiology DG Knee Complete 4 Views Right  Result Date: 11/08/2021 CLINICAL DATA:  Acute right knee pain. EXAM: RIGHT KNEE - COMPLETE 4+ VIEW COMPARISON:  None. FINDINGS: No evidence of fracture, dislocation, or joint effusion. No evidence of arthropathy or other focal bone abnormality. Soft tissues are unremarkable. IMPRESSION: Negative. Electronically Signed   By: Marijo Conception M.D.   On: 11/08/2021 10:04    Procedures Procedures    Medications Ordered in ED Medications - No data to display  ED Course/ Medical Decision Making/ A&P                           Medical Decision Making  This patient presents to the ED for concern of right knee pain, this involves an extensive number of treatment options, and is a complaint that carries with it a high risk of complications and morbidity.  The differential diagnosis includes fracture, septic arthritis, bursitis    Additional history obtained:  Additional history obtained  from electronic medical record External records from outside source obtained and reviewed including previous infectious disease note   Co morbidities that complicate the patient evaluation  Diabetes  Social Determinants of Health:  N/A    Lab Tests:  I Ordered, and personally interpreted labs.  The pertinent results include: N/A   Imaging Studies ordered:  I ordered imaging studies including DG of right knee I independently visualized and interpreted imaging which showed imaging negative for acute findings I agree with the radiologist interpretation     Rule out I have low suspicion for septic arthritis as patient denies IV drug use, skin exam was performed no  edematous, warm joints noted on exam, no new heart murmur heard on exam.  Low suspicion for fracture or dislocation as x-ray does not feel any significant findings. low suspicion for  tendon damage as area was palpated no gross defects noted, they had full range of motion as well as 5/5 strength.  Low suspicion for compartment syndrome as area was palpated it was soft to the touch, neurovascular fully intact.  I have low suspicion for PE or DVT as there is no unilateral leg swelling, no palpable cords, vital signs reassuring, nontachypneic, nonhypoxic, nontachycardic.     Dispostion and problem list  After consideration of the diagnostic results and the patients response to treatment, I feel that the patent would benefit from   Knee pain-I suspect patient might have an inflamed patella bursitis will recommend anti-inflammatories, compresses, follow-up with PCP as needed, given strict return precautions..             Final Clinical Impression(s) / ED Diagnoses Final diagnoses:  Acute pain of right knee    Rx / DC Orders ED Discharge Orders     None         Marcello Fennel, PA-C 11/08/21 1019    Davonna Belling, MD 11/09/21 1504

## 2021-11-08 NOTE — ED Triage Notes (Signed)
Complaining of right knee pain x3 days. Denies injury, no falls.

## 2021-11-08 NOTE — Discharge Instructions (Signed)
You have been seen here for knee pain. I recommend taking over-the-counter pain medications like ibuprofen and/or Tylenol every 6 as needed.  Please follow dosage and on the back of bottle.  I also recommend applying  ice to the area this will help decrease stiffness and pain.    Please follow-up with your PCP in 2 weeks time if symptoms not fully resolved  Come back to the emergency department if you develop chest pain, shortness of breath, severe abdominal pain, uncontrolled nausea, vomiting, diarrhea.

## 2021-11-29 ENCOUNTER — Encounter (HOSPITAL_COMMUNITY): Payer: Self-pay | Admitting: Emergency Medicine

## 2021-11-29 ENCOUNTER — Emergency Department (HOSPITAL_COMMUNITY): Payer: Medicare Other

## 2021-11-29 ENCOUNTER — Emergency Department (HOSPITAL_COMMUNITY)
Admission: EM | Admit: 2021-11-29 | Discharge: 2021-11-29 | Disposition: A | Discharge status: Home / Self Care | Payer: Medicare Other | Attending: Emergency Medicine | Admitting: Emergency Medicine

## 2021-11-29 DIAGNOSIS — Z7982 Long term (current) use of aspirin: Secondary | ICD-10-CM | POA: Diagnosis not present

## 2021-11-29 DIAGNOSIS — Z79899 Other long term (current) drug therapy: Secondary | ICD-10-CM | POA: Insufficient documentation

## 2021-11-29 DIAGNOSIS — L089 Local infection of the skin and subcutaneous tissue, unspecified: Secondary | ICD-10-CM | POA: Diagnosis present

## 2021-11-29 DIAGNOSIS — I96 Gangrene, not elsewhere classified: Secondary | ICD-10-CM

## 2021-11-29 DIAGNOSIS — M869 Osteomyelitis, unspecified: Secondary | ICD-10-CM

## 2021-11-29 DIAGNOSIS — E1152 Type 2 diabetes mellitus with diabetic peripheral angiopathy with gangrene: Secondary | ICD-10-CM | POA: Insufficient documentation

## 2021-11-29 DIAGNOSIS — E1169 Type 2 diabetes mellitus with other specified complication: Secondary | ICD-10-CM | POA: Diagnosis not present

## 2021-11-29 DIAGNOSIS — Z7984 Long term (current) use of oral hypoglycemic drugs: Secondary | ICD-10-CM | POA: Insufficient documentation

## 2021-11-29 LAB — CBC WITH DIFFERENTIAL/PLATELET
Abs Immature Granulocytes: 0.06 10*3/uL (ref 0.00–0.07)
Basophils Absolute: 0.1 10*3/uL (ref 0.0–0.1)
Basophils Relative: 1 %
Eosinophils Absolute: 0.2 10*3/uL (ref 0.0–0.5)
Eosinophils Relative: 3 %
HCT: 30.6 % — ABNORMAL LOW (ref 39.0–52.0)
Hemoglobin: 10.3 g/dL — ABNORMAL LOW (ref 13.0–17.0)
Immature Granulocytes: 1 %
Lymphocytes Relative: 27 %
Lymphs Abs: 1.8 10*3/uL (ref 0.7–4.0)
MCH: 29.6 pg (ref 26.0–34.0)
MCHC: 33.7 g/dL (ref 30.0–36.0)
MCV: 87.9 fL (ref 80.0–100.0)
Monocytes Absolute: 0.4 10*3/uL (ref 0.1–1.0)
Monocytes Relative: 5 %
Neutro Abs: 4.3 10*3/uL (ref 1.7–7.7)
Neutrophils Relative %: 63 %
Platelets: 353 10*3/uL (ref 150–400)
RBC: 3.48 MIL/uL — ABNORMAL LOW (ref 4.22–5.81)
RDW: 13.5 % (ref 11.5–15.5)
WBC: 6.9 10*3/uL (ref 4.0–10.5)
nRBC: 0 % (ref 0.0–0.2)

## 2021-11-29 LAB — COMPREHENSIVE METABOLIC PANEL
ALT: 9 U/L (ref 0–44)
AST: 14 U/L — ABNORMAL LOW (ref 15–41)
Albumin: 3 g/dL — ABNORMAL LOW (ref 3.5–5.0)
Alkaline Phosphatase: 65 U/L (ref 38–126)
Anion gap: 8 (ref 5–15)
BUN: 13 mg/dL (ref 6–20)
CO2: 27 mmol/L (ref 22–32)
Calcium: 9.4 mg/dL (ref 8.9–10.3)
Chloride: 100 mmol/L (ref 98–111)
Creatinine, Ser: 0.91 mg/dL (ref 0.61–1.24)
GFR, Estimated: >60 mL/min (ref >60)
Glucose, Bld: 141 mg/dL — ABNORMAL HIGH (ref 70–99)
Potassium: 3.9 mmol/L (ref 3.5–5.1)
Sodium: 135 mmol/L (ref 135–145)
Total Bilirubin: 0.6 mg/dL (ref 0.3–1.2)
Total Protein: 8.3 g/dL — ABNORMAL HIGH (ref 6.5–8.1)

## 2021-11-29 LAB — LACTIC ACID, PLASMA: Lactic Acid, Venous: 0.8 mmol/L (ref 0.5–1.9)

## 2021-11-29 MED ORDER — AMOXICILLIN-POT CLAVULANATE 875-125 MG PO TABS
1.0000 | ORAL_TABLET | Freq: Once | ORAL | Status: AC
  Administered 2021-11-29: 1 via ORAL
  Filled 2021-11-29: qty 1

## 2021-11-29 MED ORDER — AMOXICILLIN-POT CLAVULANATE 875-125 MG PO TABS: 1.0000 | ORAL_TABLET | Freq: Two times a day (BID) | ORAL | 0 refills | Status: DC

## 2021-11-29 NOTE — ED Triage Notes (Signed)
Patient with diabetes here with complaint of right middle toe wound and pain that he states he first noticed approximately 5 days ago. Patient alert, oriented, and in no apparent distress at this time.

## 2021-11-29 NOTE — ED Provider Notes (Signed)
[facility] EMERGENCY DEPARTMENT Provider Note   CSN: [id number] Arrival date & time: 11/29/21  1017     History  Chief Complaint  Patient presents with   Toe Injury    Eugene Diaz is a 56 y.o. male.  HPI     Pt presents to the ED with 2 weeks of 3rd toe, Right foot infection. States he noted the color of his toe changing as well as some discharge after hitting his toe while walking. This has been progressively worsening for about 2 weeks. Today he states that the toe is black. He denies pain but states that his toe "feels different". He reports a hx of amputation of his L leg and R big toe due to diabetes. Pt is not currently taking medication for diabetes or monitoring his blood sugars. He follows with podiatry but has not seem them for evaluation of this. Denies fever, chills, headache or severe pain.  Records indicate that patient has been seeing podiatry for the last several months.  He is status post multiple amputation.  He is also seen infectious disease doctors for his osteomyelitis.  Home Medications Prior to Admission medications   Medication Sig Start Date End Date Taking? Authorizing Provider  amoxicillin-clavulanate (AUGMENTIN) 875-125 MG tablet Take 1 tablet by mouth every 12 (twelve) hours. 11/29/21  Yes Varney Biles, MD  acetaminophen (TYLENOL) 325 MG tablet Take 2 tablets (650 mg total) by mouth every 6 (six) hours as needed for mild pain (or Fever >/= 101). Patient not taking: Reported on 07/23/2021 12/27/19   Arrien, [name], MD  ALPRAZolam Duanne Moron) 1 MG tablet Take 0.5 tablets (0.5 mg total) by mouth 4 (four) times daily as needed for anxiety. Patient not taking: Reported on 10/26/2021 01/21/20   Caren Griffins, MD  amLODipine (NORVASC) 10 MG tablet Take 1 tablet (10 mg total) by mouth daily. Patient not taking: Reported on 06/09/2021 04/03/21   Mercy Riding, MD  aspirin EC 81 MG EC tablet Take 1 tablet (81 mg total) by mouth daily.  Swallow whole. Patient not taking: Reported on 05/05/2021 04/03/21   Mercy Riding, MD  atorvastatin (LIPITOR) 40 MG tablet Take 40 mg by mouth every evening.  Patient not taking: Reported on 03/27/2021 12/17/15   [provider]  diclofenac Sodium (VOLTAREN) 1 % GEL Apply 1 application topically daily as needed (pain). Patient not taking: Reported on 05/05/2021    [provider]  ferrous sulfate 325 (65 FE) MG tablet Take 1 tablet (325 mg total) by mouth daily. Patient not taking: Reported on 03/27/2021 11/15/19 11/14/20  Shelly Coss, MD  folic acid (FOLVITE) 1 MG tablet Take 1 tablet (1 mg total) by mouth daily. Patient not taking: Reported on 03/27/2021 07/11/20   Love, Ivan Anchors, PA-C  gabapentin (NEURONTIN) 300 MG capsule TAKE 3 CAPSULES BY MOUTH IN THE MORNING, 3 CAPSULES AT NOON, AND 4 CAPSULES AT BEDTIME FOR NERVE PAIN. Patient not taking: Reported on 03/27/2021 07/03/20   Narda Amber K, DO  glipiZIDE (GLUCOTROL) 5 MG tablet Take 1 tablet (5 mg total) by mouth 2 (two) times daily. Patient not taking: No sig reported 04/02/21 09/29/21  Mercy Riding, MD  hydrALAZINE (APRESOLINE) 50 MG tablet Take 1 tablet (50 mg total) by mouth every 8 (eight) hours. Patient not taking: Reported on 03/27/2021 07/10/20   Love, Ivan Anchors, PA-C  lidocaine (XYLOCAINE) 5 % ointment Apply topically 2 (two) times daily as needed. Patient not taking: Reported on  06/09/2021 03/16/21   [provider]  lisinopril (ZESTRIL) 40 MG tablet Take 1 tablet (40 mg total) by mouth at bedtime. Patient not taking: Reported on 06/09/2021 07/10/20   Love, Ivan Anchors, PA-C  metFORMIN (GLUCOPHAGE) 500 MG tablet Take 1 tablet (500 mg total) by mouth 2 (two) times daily with a meal. Patient not taking: No sig reported 04/02/21 09/29/21  Mercy Riding, MD  morphine (MS CONTIN) 30 MG 12 hr tablet Take 30 mg by mouth 2 (two) times daily as needed. Patient not taking: Reported on 10/26/2021 08/16/21   [provider]   Multiple Vitamin (MULTIVITAMIN WITH MINERALS) TABS tablet Take 1 tablet by mouth daily. Patient not taking: Reported on 03/27/2021 07/11/20   Love, Ivan Anchors, PA-C  oxycodone (ROXICODONE) 30 MG immediate release tablet Take 30 mg by mouth 4 (four) times daily as needed. Patient not taking: Reported on 10/26/2021 08/16/21   [provider]  silver sulfADIAZINE (SILVADENE) 1 % cream Apply pea-sized amount to wound daily. Patient not taking: Reported on 07/23/2021 06/11/21   Evelina Bucy, DPM  thiamine 100 MG tablet Take 1 tablet (100 mg total) by mouth daily. Patient not taking: Reported on 03/27/2021 07/11/20   Bary Leriche, PA-C      Allergies    Patient has no known allergies.    Review of Systems   Review of Systems  Physical Exam Updated Vital Signs BP (!) 162/87 (BP Location: Left Arm)    Pulse 74    Temp 98.9 F (37.2 C) (Oral)    Resp 17    SpO2 100%  Physical Exam Vitals and nursing note reviewed.  Constitutional:      Appearance: He is well-developed.  HENT:     Head: Atraumatic.  Cardiovascular:     Rate and Rhythm: Normal rate.  Pulmonary:     Effort: Pulmonary effort is normal.  Musculoskeletal:     Cervical back: Neck supple.  Skin:    General: Skin is warm.     Findings: No erythema.     Comments: Gangrenous 3rd toe, R foot, no warmth or erythema over the right foot. 2+ dorsalis pedis  Neurological:     Mental Status: He is alert and oriented to person, place, and time.           ED Results / Procedures / Treatments   Labs (all labs ordered are listed, but only abnormal results are displayed) Labs Reviewed  COMPREHENSIVE METABOLIC PANEL - Abnormal; Notable for the following components:      Result Value   Glucose, Bld 141 (*)    Total Protein 8.3 (*)    Albumin 3.0 (*)    AST 14 (*)    All other components within normal limits  CBC WITH DIFFERENTIAL/PLATELET - Abnormal; Notable for the following components:   RBC 3.48 (*)     Hemoglobin 10.3 (*)    HCT 30.6 (*)    All other components within normal limits  CULTURE, BLOOD (ROUTINE X 2)  CULTURE, BLOOD (ROUTINE X 2)  LACTIC ACID, PLASMA    EKG None  Radiology DG Foot Complete Right  Result Date: 11/29/2021 CLINICAL DATA:  Distal third toe wound with exposed bone. EXAM: RIGHT FOOT COMPLETE - 3+ VIEW COMPARISON:  Right foot x-rays dated Mar 29, 2021. FINDINGS: New soft tissue ulceration of the tip of the third toe with underlying bony destruction of the third distal phalanx. Diffuse soft tissue swelling of the second and third toes.  Prior first ray amputation. No acute fracture or dislocation. Bone mineralization is normal. IMPRESSION: 1. New soft tissue ulceration of the tip of the third toe with underlying bony destruction of the third distal phalanx, consistent with osteomyelitis. Electronically Signed   By: Titus Dubin M.D.   On: 11/29/2021 12:42    Procedures .Critical Care Performed by: Varney Biles, MD Authorized by: Varney Biles, MD   Critical care provider statement:    Critical care time (minutes):  30   Critical care was necessary to treat or prevent imminent or life-threatening deterioration of the following conditions: Gangrene.   Critical care was time spent personally by me on the following activities:  Development of treatment plan with patient or surrogate, discussions with consultants, evaluation of patient's response to treatment, examination of patient, ordering and review of laboratory studies, ordering and review of radiographic studies, ordering and performing treatments and interventions, pulse oximetry, re-evaluation of patient's condition and review of old charts      Medications Ordered in ED Medications  amoxicillin-clavulanate (AUGMENTIN) 875-125 MG per tablet 1 tablet (1 tablet Oral Given 11/29/21 1424)    ED Course/ Medical Decision Making/ A&P                           Medical Decision Making Amount and/or  Complexity of Data Reviewed Labs: ordered.  Risk Prescription drug management.   This patient presents to the ED with chief complaint(s) of toe pain with pertinent past medical history of significant vasculopathy status post multiple amputations which further complicates the presenting complaint. The complaint involves an extensive differential diagnosis and treatment options and also carries with it a high risk of complications and morbidity.   Patient is essentially gangrenous toe, no evidence of surrounding cellulitis and he has 2+ dorsalis pedis pulse.  No significant pain.  Clinically not septic.  The differential diagnosis included gangrenous toe leading to bacteremia and sepsis, localized gangrenous infection, osteomyelitis.  The initial plan is to follow-up on the x-ray that was ordered, and reviewed the labs. The x-ray, which was reviewed independently, confirms the concerns for osteomyelitis given the bony erosion over the distal third toe.  Additional Test Considered: MRI of the toe, however when x-ray was reviewed, there appears to be signs of osteomyelitis.  Furthermore, patient's treatment most likely will be amputation, in which case confirming osteo with MRI becomes unnecessary.  We considered admission to the hospital as well, however patient is not clinically septic.  He however does need close follow-up , as there is potential for this to become septic.  So we will call infectious disease team and podiatry service to ensure patient gets appropriate care and follow-up.  Additional history obtained: Records reviewed previous admission documents and Primary Care Documents  Reassessment and review: Lab Tests ordered and personally interpreted: CBC, metabolic profile, lactic acid, which are all reassuring from emergency perspective.   Imaging Studies ordered: I independently visualized and interpreted the following imaging X-ray of the toe   which showed bony erosion over  the distal tip of the third toe. I agree with the radiologist interpretation  Critical Interventions:  Deemed that patient is not septic, consulted infectious disease to ensure that outpatient management is appropriate, spoke with podiatry service to ensure patient gets close follow-up.  ID team confirmed that the treatment is going to be surgical.  Podiatry team will see the patient this week, they requested that we start him on Augmentin.  Consultations Obtained: I requested consultation with the consultant -see above .  Complexity of problems addressed: Patients presentation is most consistent with  acute presentation with potential threat to life or bodily function During patient's assessment  Disposition: After consideration of the diagnostic results and the patients response to treatment,  I feel that the patent would benefit from discharge with strict ER return precautions and close follow-up .    Reassessment: Spoke with [name], infectious disease.  She we will schedule the patient to be seen this week. Final Clinical Impression(s) / ED Diagnoses Final diagnoses:  Toe gangrene ([place])  Toe osteomyelitis ([place])    Rx / DC Orders ED Discharge Orders          Ordered    amoxicillin-clavulanate (AUGMENTIN) 875-125 MG tablet  Every 12 hours        11/29/21 [place], Anuradha Chabot, MD 11/30/21 [phone]

## 2021-11-29 NOTE — Progress Notes (Signed)
Podiatry  Discussed case with Dr. Kathrynn Humble.  Patient is deemed to have stable dry gangrene of the toe with no other constitutional symptoms from his evaluation request outpatient treatment since there is no other significant findings.  I advised PO Augmentin and  Betadine and dry dressing to the right third toe with follow-up in office to schedule outpatient procedure however if toe worsens patient should return to the ER.   Dr. Landis Martins On Call provider  [facility] [phone] office [phone] cell Available via secure chat

## 2021-11-29 NOTE — ED Provider Triage Note (Signed)
Emergency Medicine Provider Triage Evaluation Note  Eugene Diaz , a 56 y.o. male  was evaluated in triage.  Pt complains of right foot infection.  Review of Systems  Positive: Right foot infection Negative: injury  Physical Exam  BP 128/70    Pulse 84    Temp 98.9 F (37.2 C) (Oral)    Resp 17    SpO2 100%  Gen:   Awake, no distress   Resp:  Normal effort  MSK:   Moves extremities without difficulty  Other:       Medical Decision Making  Medically screening exam initiated at 11:21 AM.  Appropriate orders placed.  Eugene Diaz was informed that the remainder of the evaluation will be completed by another provider, this initial triage assessment does not replace that evaluation, and the importance of remaining in the ED until their evaluation is complete.     Tacy Learn, PA-C 11/29/21 1122

## 2021-11-29 NOTE — Discharge Instructions (Signed)
You have a gangrenous toe that will require amputation  Likely, the infection is localized to the toe itself.  We will be starting you on antibiotics and have contacted podiatry who will schedule you for an outpatient amputation.  We recommend that you apply Betadine to your wound twice a day.  If you start noticing increased redness and swelling to your foot, please return to the ER.

## 2021-11-30 ENCOUNTER — Ambulatory Visit: Payer: Medicare Other | Admitting: Podiatry

## 2021-12-03 ENCOUNTER — Inpatient Hospital Stay (HOSPITAL_COMMUNITY)
Admission: EM | Admit: 2021-12-03 | Discharge: 2021-12-07 | DRG: 240 | Disposition: A | Discharge status: Home Health | Payer: Medicare Other | Attending: Internal Medicine | Admitting: Internal Medicine

## 2021-12-03 ENCOUNTER — Inpatient Hospital Stay (HOSPITAL_COMMUNITY): Payer: Medicare Other

## 2021-12-03 ENCOUNTER — Other Ambulatory Visit: Payer: Self-pay

## 2021-12-03 ENCOUNTER — Encounter (HOSPITAL_COMMUNITY): Payer: Self-pay | Admitting: *Deleted

## 2021-12-03 ENCOUNTER — Ambulatory Visit (INDEPENDENT_AMBULATORY_CARE_PROVIDER_SITE_OTHER): Payer: Medicare Other

## 2021-12-03 ENCOUNTER — Ambulatory Visit (INDEPENDENT_AMBULATORY_CARE_PROVIDER_SITE_OTHER): Payer: Medicare Other | Admitting: Podiatry

## 2021-12-03 ENCOUNTER — Ambulatory Visit: Payer: Medicare Other | Admitting: Podiatry

## 2021-12-03 DIAGNOSIS — E876 Hypokalemia: Secondary | ICD-10-CM | POA: Diagnosis present

## 2021-12-03 DIAGNOSIS — Z89512 Acquired absence of left leg below knee: Secondary | ICD-10-CM

## 2021-12-03 DIAGNOSIS — Z9151 Personal history of suicidal behavior: Secondary | ICD-10-CM

## 2021-12-03 DIAGNOSIS — E1152 Type 2 diabetes mellitus with diabetic peripheral angiopathy with gangrene: Secondary | ICD-10-CM | POA: Diagnosis present

## 2021-12-03 DIAGNOSIS — Z89411 Acquired absence of right great toe: Secondary | ICD-10-CM | POA: Diagnosis not present

## 2021-12-03 DIAGNOSIS — Z8249 Family history of ischemic heart disease and other diseases of the circulatory system: Secondary | ICD-10-CM | POA: Diagnosis not present

## 2021-12-03 DIAGNOSIS — I96 Gangrene, not elsewhere classified: Secondary | ICD-10-CM

## 2021-12-03 DIAGNOSIS — Z87891 Personal history of nicotine dependence: Secondary | ICD-10-CM | POA: Diagnosis not present

## 2021-12-03 DIAGNOSIS — M86171 Other acute osteomyelitis, right ankle and foot: Secondary | ICD-10-CM | POA: Diagnosis present

## 2021-12-03 DIAGNOSIS — L97516 Non-pressure chronic ulcer of other part of right foot with bone involvement without evidence of necrosis: Secondary | ICD-10-CM | POA: Diagnosis present

## 2021-12-03 DIAGNOSIS — Z6835 Body mass index (BMI) 35.0-35.9, adult: Secondary | ICD-10-CM | POA: Diagnosis not present

## 2021-12-03 DIAGNOSIS — E11628 Type 2 diabetes mellitus with other skin complications: Secondary | ICD-10-CM | POA: Diagnosis present

## 2021-12-03 DIAGNOSIS — L03031 Cellulitis of right toe: Secondary | ICD-10-CM | POA: Diagnosis present

## 2021-12-03 DIAGNOSIS — L97511 Non-pressure chronic ulcer of other part of right foot limited to breakdown of skin: Secondary | ICD-10-CM | POA: Diagnosis not present

## 2021-12-03 DIAGNOSIS — F419 Anxiety disorder, unspecified: Secondary | ICD-10-CM | POA: Diagnosis present

## 2021-12-03 DIAGNOSIS — K219 Gastro-esophageal reflux disease without esophagitis: Secondary | ICD-10-CM | POA: Diagnosis present

## 2021-12-03 DIAGNOSIS — Z79899 Other long term (current) drug therapy: Secondary | ICD-10-CM | POA: Diagnosis not present

## 2021-12-03 DIAGNOSIS — M869 Osteomyelitis, unspecified: Secondary | ICD-10-CM

## 2021-12-03 DIAGNOSIS — E1142 Type 2 diabetes mellitus with diabetic polyneuropathy: Secondary | ICD-10-CM | POA: Diagnosis present

## 2021-12-03 DIAGNOSIS — E1159 Type 2 diabetes mellitus with other circulatory complications: Secondary | ICD-10-CM | POA: Diagnosis not present

## 2021-12-03 DIAGNOSIS — E1169 Type 2 diabetes mellitus with other specified complication: Secondary | ICD-10-CM | POA: Diagnosis present

## 2021-12-03 DIAGNOSIS — F329 Major depressive disorder, single episode, unspecified: Secondary | ICD-10-CM | POA: Diagnosis present

## 2021-12-03 DIAGNOSIS — Z01810 Encounter for preprocedural cardiovascular examination: Secondary | ICD-10-CM | POA: Diagnosis not present

## 2021-12-03 DIAGNOSIS — E7800 Pure hypercholesterolemia, unspecified: Secondary | ICD-10-CM | POA: Diagnosis present

## 2021-12-03 DIAGNOSIS — G621 Alcoholic polyneuropathy: Secondary | ICD-10-CM | POA: Diagnosis present

## 2021-12-03 DIAGNOSIS — E1140 Type 2 diabetes mellitus with diabetic neuropathy, unspecified: Secondary | ICD-10-CM | POA: Diagnosis present

## 2021-12-03 DIAGNOSIS — D638 Anemia in other chronic diseases classified elsewhere: Secondary | ICD-10-CM | POA: Diagnosis present

## 2021-12-03 DIAGNOSIS — L089 Local infection of the skin and subcutaneous tissue, unspecified: Secondary | ICD-10-CM | POA: Insufficient documentation

## 2021-12-03 DIAGNOSIS — E1141 Type 2 diabetes mellitus with diabetic mononeuropathy: Secondary | ICD-10-CM | POA: Diagnosis not present

## 2021-12-03 DIAGNOSIS — J45909 Unspecified asthma, uncomplicated: Secondary | ICD-10-CM | POA: Diagnosis present

## 2021-12-03 DIAGNOSIS — E119 Type 2 diabetes mellitus without complications: Secondary | ICD-10-CM

## 2021-12-03 DIAGNOSIS — E785 Hyperlipidemia, unspecified: Secondary | ICD-10-CM | POA: Diagnosis not present

## 2021-12-03 DIAGNOSIS — Z9889 Other specified postprocedural states: Secondary | ICD-10-CM

## 2021-12-03 DIAGNOSIS — M1990 Unspecified osteoarthritis, unspecified site: Secondary | ICD-10-CM | POA: Diagnosis present

## 2021-12-03 DIAGNOSIS — E43 Unspecified severe protein-calorie malnutrition: Secondary | ICD-10-CM

## 2021-12-03 DIAGNOSIS — I10 Essential (primary) hypertension: Secondary | ICD-10-CM | POA: Diagnosis present

## 2021-12-03 DIAGNOSIS — I70261 Atherosclerosis of native arteries of extremities with gangrene, right leg: Secondary | ICD-10-CM | POA: Diagnosis present

## 2021-12-03 DIAGNOSIS — I152 Hypertension secondary to endocrine disorders: Secondary | ICD-10-CM | POA: Diagnosis not present

## 2021-12-03 DIAGNOSIS — E538 Deficiency of other specified B group vitamins: Secondary | ICD-10-CM | POA: Diagnosis present

## 2021-12-03 DIAGNOSIS — Z20822 Contact with and (suspected) exposure to covid-19: Secondary | ICD-10-CM | POA: Diagnosis present

## 2021-12-03 DIAGNOSIS — G8929 Other chronic pain: Secondary | ICD-10-CM | POA: Diagnosis present

## 2021-12-03 DIAGNOSIS — M24574 Contracture, right foot: Secondary | ICD-10-CM | POA: Diagnosis not present

## 2021-12-03 DIAGNOSIS — R45851 Suicidal ideations: Secondary | ICD-10-CM | POA: Diagnosis not present

## 2021-12-03 LAB — COMPREHENSIVE METABOLIC PANEL
ALT: 18 U/L (ref 0–44)
AST: 29 U/L (ref 15–41)
Albumin: 3.5 g/dL (ref 3.5–5.0)
Alkaline Phosphatase: 78 U/L (ref 38–126)
Anion gap: 6 (ref 5–15)
BUN: 10 mg/dL (ref 6–20)
CO2: 22 mmol/L (ref 22–32)
Calcium: 9.1 mg/dL (ref 8.9–10.3)
Chloride: 106 mmol/L (ref 98–111)
Creatinine, Ser: 0.93 mg/dL (ref 0.61–1.24)
GFR, Estimated: >60 mL/min (ref >60)
Glucose, Bld: 152 mg/dL — ABNORMAL HIGH (ref 70–99)
Potassium: 3.4 mmol/L — ABNORMAL LOW (ref 3.5–5.1)
Sodium: 134 mmol/L — ABNORMAL LOW (ref 135–145)
Total Bilirubin: 0.4 mg/dL (ref 0.3–1.2)
Total Protein: 8.8 g/dL — ABNORMAL HIGH (ref 6.5–8.1)

## 2021-12-03 LAB — CBC WITH DIFFERENTIAL/PLATELET
Abs Immature Granulocytes: 0.1 10*3/uL — ABNORMAL HIGH (ref 0.00–0.07)
Basophils Absolute: 0.1 10*3/uL (ref 0.0–0.1)
Basophils Relative: 1 %
Eosinophils Absolute: 0.3 10*3/uL (ref 0.0–0.5)
Eosinophils Relative: 4 %
HCT: 35.3 % — ABNORMAL LOW (ref 39.0–52.0)
Hemoglobin: 11.8 g/dL — ABNORMAL LOW (ref 13.0–17.0)
Immature Granulocytes: 1 %
Lymphocytes Relative: 35 %
Lymphs Abs: 3.2 10*3/uL (ref 0.7–4.0)
MCH: 29.4 pg (ref 26.0–34.0)
MCHC: 33.4 g/dL (ref 30.0–36.0)
MCV: 87.8 fL (ref 80.0–100.0)
Monocytes Absolute: 0.3 10*3/uL (ref 0.1–1.0)
Monocytes Relative: 3 %
Neutro Abs: 5.1 10*3/uL (ref 1.7–7.7)
Neutrophils Relative %: 56 %
Platelets: 427 10*3/uL — ABNORMAL HIGH (ref 150–400)
RBC: 4.02 MIL/uL — ABNORMAL LOW (ref 4.22–5.81)
RDW: 13.4 % (ref 11.5–15.5)
WBC: 9.2 10*3/uL (ref 4.0–10.5)
nRBC: 0 % (ref 0.0–0.2)

## 2021-12-03 LAB — LACTIC ACID, PLASMA
Lactic Acid, Venous: 1.3 mmol/L (ref 0.5–1.9)
Lactic Acid, Venous: 1 mmol/L (ref 0.5–1.9)

## 2021-12-03 LAB — RESP PANEL BY RT-PCR (FLU A&B, COVID) ARPGX2
Influenza A by PCR: NEGATIVE
Influenza B by PCR: NEGATIVE
SARS Coronavirus 2 by RT PCR: NEGATIVE

## 2021-12-03 LAB — CBG MONITORING, ED: Glucose-Capillary: 135 mg/dL — ABNORMAL HIGH (ref 70–99)

## 2021-12-03 MED ORDER — SODIUM CHLORIDE 0.9 % IV SOLN: 250.0000 mL | INTRAVENOUS | Status: DC | PRN

## 2021-12-03 MED ORDER — VANCOMYCIN HCL 1500 MG/300ML IV SOLN
1500.0000 mg | Freq: Two times a day (BID) | INTRAVENOUS | Status: DC
  Administered 2021-12-04 – 2021-12-06 (×5): 1500 mg via INTRAVENOUS
  Filled 2021-12-03 (×7): qty 300

## 2021-12-03 MED ORDER — SODIUM CHLORIDE 0.9% FLUSH
3.0000 mL | Freq: Two times a day (BID) | INTRAVENOUS | Status: DC
  Administered 2021-12-03 – 2021-12-07 (×5): 3 mL via INTRAVENOUS

## 2021-12-03 MED ORDER — AMLODIPINE BESYLATE 10 MG PO TABS
10.0000 mg | ORAL_TABLET | Freq: Every day | ORAL | Status: DC
Start: 2021-12-03 — End: 2021-12-07
  Administered 2021-12-03 – 2021-12-07 (×4): 10 mg via ORAL
  Filled 2021-12-03 (×2): qty 1
  Filled 2021-12-03: qty 2
  Filled 2021-12-03 (×3): qty 1

## 2021-12-03 MED ORDER — INSULIN ASPART 100 UNIT/ML IJ SOLN
0.0000 [IU] | Freq: Three times a day (TID) | INTRAMUSCULAR | Status: DC
  Administered 2021-12-04 – 2021-12-05 (×2): 2 [IU] via SUBCUTANEOUS
  Administered 2021-12-06: 3 [IU] via SUBCUTANEOUS

## 2021-12-03 MED ORDER — ALPRAZOLAM 0.5 MG PO TABS
0.5000 mg | ORAL_TABLET | Freq: Four times a day (QID) | ORAL | Status: DC | PRN
  Administered 2021-12-04 – 2021-12-05 (×2): 0.5 mg via ORAL
  Filled 2021-12-03 (×2): qty 1

## 2021-12-03 MED ORDER — SODIUM CHLORIDE 0.9% FLUSH: 3.0000 mL | INTRAVENOUS | Status: DC | PRN

## 2021-12-03 MED ORDER — SODIUM CHLORIDE 0.9 % IV SOLN
2.0000 g | Freq: Three times a day (TID) | INTRAVENOUS | Status: DC
  Administered 2021-12-03 – 2021-12-06 (×8): 2 g via INTRAVENOUS
  Filled 2021-12-03 (×9): qty 2

## 2021-12-03 MED ORDER — MORPHINE SULFATE (PF) 2 MG/ML IV SOLN
2.0000 mg | INTRAVENOUS | Status: DC | PRN
  Administered 2021-12-04 – 2021-12-07 (×8): 2 mg via INTRAVENOUS
  Filled 2021-12-03 (×8): qty 1

## 2021-12-03 MED ORDER — MORPHINE SULFATE ER 15 MG PO TBCR
30.0000 mg | EXTENDED_RELEASE_TABLET | Freq: Two times a day (BID) | ORAL | Status: DC
Start: 2021-12-03 — End: 2021-12-07
  Administered 2021-12-03 – 2021-12-07 (×8): 30 mg via ORAL
  Filled 2021-12-03 (×6): qty 2
  Filled 2021-12-03: qty 1
  Filled 2021-12-03: qty 2

## 2021-12-03 MED ORDER — ATORVASTATIN CALCIUM 40 MG PO TABS
40.0000 mg | ORAL_TABLET | Freq: Every evening | ORAL | Status: DC
  Administered 2021-12-03 – 2021-12-06 (×4): 40 mg via ORAL
  Filled 2021-12-03 (×4): qty 1

## 2021-12-03 MED ORDER — LISINOPRIL 20 MG PO TABS
40.0000 mg | ORAL_TABLET | Freq: Every day | ORAL | Status: DC
  Administered 2021-12-04 – 2021-12-07 (×4): 40 mg via ORAL
  Filled 2021-12-03 (×5): qty 2

## 2021-12-03 MED ORDER — VANCOMYCIN HCL 10 G IV SOLR
2500.0000 mg | Freq: Once | INTRAVENOUS | Status: AC
  Administered 2021-12-03: 2500 mg via INTRAVENOUS
  Filled 2021-12-03: qty 2500

## 2021-12-03 MED ORDER — GADOBUTROL 1 MMOL/ML IV SOLN
10.0000 mL | Freq: Once | INTRAVENOUS | Status: AC | PRN
  Administered 2021-12-03: 10 mL via INTRAVENOUS

## 2021-12-03 MED ORDER — INSULIN ASPART 100 UNIT/ML IJ SOLN
0.0000 [IU] | Freq: Every day | INTRAMUSCULAR | Status: DC
  Administered 2021-12-06: 2 [IU] via SUBCUTANEOUS

## 2021-12-03 MED ORDER — HEPARIN SODIUM (PORCINE) 5000 UNIT/ML IJ SOLN
5000.0000 [IU] | Freq: Once | INTRAMUSCULAR | Status: AC
  Administered 2021-12-03: 5000 [IU] via SUBCUTANEOUS
  Filled 2021-12-03: qty 1

## 2021-12-03 NOTE — Assessment & Plan Note (Signed)
-  stable, trend CBC

## 2021-12-03 NOTE — H&P (Signed)
Patient: Eugene Diaz 56 y.o. male MRN: [id number]  Today 12/03/21 is hospital day 0 after presentation to ED on 12/03/2021  3:23 PM  RECORD REVIEW AND HOSPITAL COURSE: 56 year old male with past medical history of diabetes, previous foot/toe amputations including left BKA, right first toe.  Presents with gangrenous right third toe.  Reports has been painful for some time, "looking pretty bad" and he went to podiatry outpatient today for wound check, after having been treated in the ED last week and noting worsening of the toe.  Podiatry advised he go to the ED, likely would need admission for IV antibiotics and possible surgical amputation of the third digit.  MRI, ABIs ordered.  ED, mild hypertension up to 846 systolic, 97 diastolic, patient not on home medications.  White cell count within normal limits, mild low hemoglobin of 11.8, mild low sodium 134, mild low potassium 3.4, glucose controlled at 152, blood cultures obtained,  Procedures and Significant Results:  X-ray right foot 12/03/2021 concerning for osteomyelitis MRI right foot ordered 12/03/2021: Pending  Consultants:  Podiatry    SUBJECTIVE:  Patient seen and examined in ED, reports feeling okay other than some pain in the right foot but this is controlled.  He is nervous about possible amputation, would like to preserve the leg if at all possible of course given that he has already had a left BKA.     ASSESSMENT & PLAN  Gangrene right third toe associated with type 2 diabetes mellitus ([place]), likely osteomyelitis -[name] and MRI currently pending -Podiatry consulted, likely 2 OR in a.m. pending MRI results, patient n.p.o. after midnight -Based on previous culture results, continue vancomycin and cefepime  Hypertension associated with type 2 diabetes mellitus ([place]) - Restarted home medications: Amlodipine 10 mg daily, lisinopril 40 mg daily, hydralazine 10 mg 3 times daily  Hyperlipidemia associated with type 2 diabetes  mellitus ([place]) - Restarted home atorvastatin  GERD (gastroesophageal reflux disease) -Currently well controlled, consider starting PPI/H2 blocker  -PRN Rx while inpatient  Type 2 diabetes mellitus ([place]) -SSI while inpatient  Anemia, chronic disease -stable, trend CBC    VTE Ppx: heparin today pending OR tomorrow CODE STATUS   Code Status: Full Code Admitted from: home Expected Dispo: possibly home but may need SNF/Rehab or at least [facility] on discharge pending possible surgery Barriers to discharge: Patient remains inpatient appropriate receiving IV antibiotics, pending further work-up with MRI/[name] and podiatry consultation likely will be going to the OR  Family communication: none at this time, offered to call for patient               Past Medical History:  Diagnosis Date   Alcohol abuse    Alcoholic peripheral neuropathy ([place])    Anxiety    Arthritis    Asthma    followed by pcp   B12 deficiency    Depression    Diabetic neuropathy ([place])    Edema of both lower extremities    Essential tremor    neurologist-- dr patel--- due to alcohol abuse   GERD (gastroesophageal reflux disease)    History of cellulitis 2020   left lower leg;   recurrent 03-25-2020   History of esophageal stricture    s/p  dilatation 02-13-2018   History of osteomyelitis 2020   left great toe     Hypercholesterolemia    Hypertension    followed by pcp  (nuclear stress test 03-11-2014 low risk w/ no ischemia, ef 65%   Normocytic  anemia    followed by pcp   (03-18-2020 had transfusion's 02/ 2021)   Osteomyelitis of great toe of right foot ([place]) 08/18/2021   Peripheral vascular disease ([place])    Severe depression ([place]) 06/09/2021   Status post incision and drainage followed by Dr [name] and pcp   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   (03-17-2020  pt states most of incision is healed with exception an area that is still draining,  daily dressing change),  foot is red but not warm to  the touch and has swelling but has improved)   Suicidal ideation 06/09/2021   Thrombocytopenia ([place])    chronic   Type 2 diabetes mellitus ([place])    followed by pcp---    (03-18-2020  pt stated does not check blood sugar)    Past Surgical History:  Procedure Laterality Date   AMPUTATION Left 10/16/2019   Procedure: Left partial second ray resection; placement of antibiotic beads;  Surgeon: Evelina Bucy, DPM;  [facility]: [facility];  Service: Podiatry;  Laterality: Left;   AMPUTATION Left 11/13/2019   Procedure: Left Midfoot Amputation - Transmetatarsal vs. Lisfranc; Placement antibiotic beads;  Surgeon: Evelina Bucy, DPM;  [facility];  Service: Podiatry;  Laterality: Left;   AMPUTATION Left 12/21/2019   Procedure: Chopart Amputation left foot;  Surgeon: Evelina Bucy, DPM;  Location: [facility];  Service: Podiatry;  Laterality: Left;   AMPUTATION Left 06/24/2020   Procedure: LEFT BELOW KNEE AMPUTATION;  Surgeon: Newt Minion, MD;  Location: [facility];  Service: Orthopedics;  Laterality: Left;   AMPUTATION Right 03/29/2021   Procedure: AMPUTATION RAY;  Surgeon: Evelina Bucy, DPM;  Location: [facility];  Service: Podiatry;  Laterality: Right;   AMPUTATION TOE Left 08/14/2019   Procedure: AMPUTATION GREAT TOE;  Surgeon: Evelina Bucy, DPM;  [facility];  Service: Podiatry;  Laterality: Left;   APPLICATION OF WOUND VAC Left 12/21/2019   Procedure: Application Of Wound Vac;  Surgeon: Evelina Bucy, DPM;  Location: [facility];  Service: Podiatry;  Laterality: Left;   BIOPSY  02/13/2018   Procedure: BIOPSY;  Surgeon: Danie Binder, MD;  [facility];  Service: Endoscopy;;  transverse colon biopsy, gastric biopsy   COLONOSCOPY WITH PROPOFOL N/A 02/13/2018   Procedure: COLONOSCOPY WITH PROPOFOL;  Surgeon: Danie Binder, MD;  [facility];  Service: Endoscopy;  Laterality: N/A;  11:15am   ESOPHAGOGASTRODUODENOSCOPY (EGD) WITH PROPOFOL N/A 02/13/2018   Procedure:  ESOPHAGOGASTRODUODENOSCOPY (EGD) WITH PROPOFOL;  Surgeon: Danie Binder, MD;  [facility];  Service: Endoscopy;  Laterality: N/A;   I & D EXTREMITY Left 07/16/2019   Procedure: Insicion  AND DEBRIDEMENT EXTREMITY;  Surgeon: Evelina Bucy, DPM;  Location: [facility];  Service: Podiatry;  Laterality: Left;   I & D EXTREMITY Left 06/22/2020   Procedure: IRRIGATION AND DEBRIDEMENT EXTREMITY, left foot and ankle;  Surgeon: Evelina Bucy, DPM;  Location: [facility];  Service: Podiatry;  Laterality: Left;   I & D EXTREMITY Right 03/29/2021   Procedure: IRRIGATION AND DEBRIDEMENT EXTREMITY;  Surgeon: Evelina Bucy, DPM;  Location: [facility];  Service: Podiatry;  Laterality: Right;   INCISION AND DRAINAGE OF WOUND Left 10/13/2019   Procedure: IRRIGATION AND DEBRIDEMENT WOUND OF LEFT FOOT AND FIRST METATARSAL RESECTION;  Surgeon: Evelina Bucy, DPM;  [facility];  Service: Podiatry;  Laterality: Left;   IRRIGATION AND DEBRIDEMENT FOOT Left 11/11/2019   Procedure: Left Foot Wound  Irrigation and Debridement;  Surgeon: Evelina Bucy, DPM;  [facility];  Service: Podiatry;  Laterality: Left;   Left arm     Left arm repair (tendon and artery)   PERCUTANEOUS PINNING  07/16/2019   Procedure: Open Reduction Percutaneous Pinning Extremity;  Surgeon: Evelina Bucy, DPM;  Location: [facility];  Service: Podiatry;;   PILONIDAL CYST EXCISION N/A 08/08/2014   Procedure: CYST EXCISION PILONIDAL EXTENSIVE;  Surgeon: Jamesetta So, MD;  [facility];  Service: General;  Laterality: N/A;   POLYPECTOMY  02/13/2018   Procedure: POLYPECTOMY;  Surgeon: Danie Binder, MD;  [facility];  Service: Endoscopy;;  transverse colon polyp hs, rectal polyps times 2   SAVORY DILATION N/A 02/13/2018   Procedure: SAVORY DILATION;  Surgeon: Danie Binder, MD;  [facility];  Service: Endoscopy;  Laterality: N/A;   WOUND DEBRIDEMENT Left 09/04/2019   Procedure: DEBRIDEMENT WOUND WITH COMPLEX   REPAIR OF DEHISCENCE;  Surgeon: Evelina Bucy, DPM;  Location: [facility];  Service: Podiatry;  Laterality: Left;  Leave patient on stretcher   WOUND DEBRIDEMENT Left 10/16/2019   Procedure: Left foot wound debridement and closure;  Surgeon: Evelina Bucy, DPM;  [facility];  Service: Podiatry;  Laterality: Left;   WOUND DEBRIDEMENT Left 12/18/2019   Procedure: LEFT FOOT DEBRIDEMENT WITH PARTIAL INCISION OF INFECTED BONE;  Surgeon: Evelina Bucy, DPM;  Location: [facility];  Service: Podiatry;  Laterality: Left;  LEFT FOOT DEBRIDEMENT WITH PARTIAL INCISION OF INFECTED BONE   WOUND DEBRIDEMENT Right 04/01/2021   Procedure: Right foot wound debridement and irrigation and closure, bone biopsy;  Surgeon: Evelina Bucy, DPM;  Location: [facility];  Service: Podiatry;  Laterality: Right;    Family History  Problem Relation Age of Onset   Heart attack Mother        Living, 52   Cancer Father        Deceased   Healthy Brother    Healthy Sister    Colon cancer Neg Hx    Colon polyps Neg Hx    Social History:  reports that he quit smoking about 37 years ago. His smoking use included cigarettes. He has a 62.50 pack-year smoking history. He has never used smokeless tobacco. He reports that he does not currently use alcohol after a past usage of about 50.0 standard drinks per week. He reports that he does not currently use drugs after having used the following drugs: Marijuana.  Allergies: No Known Allergies  (Not in a hospital admission)   Results for orders placed or performed during the hospital encounter of 12/03/21 (from the past 48 hour(s))  Comprehensive metabolic panel     Status: Abnormal   Collection Time: 12/03/21  3:58 PM  Result Value Ref Range   Sodium 134 (L) 135 - 145 mmol/L   Potassium 3.4 (L) 3.5 - 5.1 mmol/L   Chloride 106 98 - 111 mmol/L   CO2 22 22 - 32 mmol/L   Glucose, Bld 152 (H) 70 - 99 mg/dL    Comment: Glucose reference range applies only to  samples taken after fasting for at least 8 hours.   BUN 10 6 - 20 mg/dL   Creatinine, Ser 0.93 0.61 - 1.24 mg/dL   Calcium 9.1 8.9 - 10.3 mg/dL   Total Protein 8.8 (H) 6.5 - 8.1 g/dL   Albumin 3.5 3.5 - 5.0 g/dL   AST 29 15 - 41 U/L  ALT 18 0 - 44 U/L   Alkaline Phosphatase 78 38 - 126 U/L   Total Bilirubin 0.4 0.3 - 1.2 mg/dL   GFR, Estimated >60 >60 mL/min    Comment: (NOTE) Calculated using the CKD-EPI Creatinine Equation (2021)    Anion gap 6 5 - 15    Comment: Performed at [facility] [address] [place], [place] [address]  CBC with Differential     Status: Abnormal   Collection Time: 12/03/21  3:58 PM  Result Value Ref Range   WBC 9.2 4.0 - 10.5 K/uL   RBC 4.02 (L) 4.22 - 5.81 MIL/uL   Hemoglobin 11.8 (L) 13.0 - 17.0 g/dL   HCT 35.3 (L) 39.0 - 52.0 %   MCV 87.8 80.0 - 100.0 fL   MCH 29.4 26.0 - 34.0 pg   MCHC 33.4 30.0 - 36.0 g/dL   RDW 13.4 11.5 - 15.5 %   Platelets 427 (H) 150 - 400 K/uL   nRBC 0.0 0.0 - 0.2 %   Neutrophils Relative % 56 %   Neutro Abs 5.1 1.7 - 7.7 K/uL   Lymphocytes Relative 35 %   Lymphs Abs 3.2 0.7 - 4.0 K/uL   Monocytes Relative 3 %   Monocytes Absolute 0.3 0.1 - 1.0 K/uL   Eosinophils Relative 4 %   Eosinophils Absolute 0.3 0.0 - 0.5 K/uL   Basophils Relative 1 %   Basophils Absolute 0.1 0.0 - 0.1 K/uL   Immature Granulocytes 1 %   Abs Immature Granulocytes 0.10 (H) 0.00 - 0.07 K/uL    Comment: Performed at [facility] [address], [place], [place] [address]  Lactic acid, plasma     Status: None   Collection Time: 12/03/21  3:58 PM  Result Value Ref Range   Lactic Acid, Venous 1.3 0.5 - 1.9 mmol/L    Comment: Performed at [facility] [address], [place], [place] [address]  Lactic acid, plasma     Status: None   Collection Time: 12/03/21  5:33 PM  Result Value Ref Range   Lactic Acid, Venous 1.0 0.5 - 1.9 mmol/L    Comment: Performed at [facility] [address], [place], [place] [address]   Resp Panel by RT-PCR (Flu A&B, Covid) Nasopharyngeal Swab     Status: None   Collection Time: 12/03/21  6:25 PM   Specimen: Nasopharyngeal Swab; Nasopharyngeal(NP) swabs in vial transport medium  Result Value Ref Range   SARS Coronavirus 2 by RT PCR NEGATIVE NEGATIVE    Comment: (NOTE) SARS-CoV-2 target nucleic acids are NOT DETECTED.  The SARS-CoV-2 RNA is generally detectable in upper respiratory specimens during the acute phase of infection. The lowest concentration of SARS-CoV-2 viral copies this assay can detect is 138 copies/mL. A negative result does not preclude SARS-Cov-2 infection and should not be used as the sole basis for treatment or other patient management decisions. A negative result may occur with  improper specimen collection/handling, submission of specimen other than nasopharyngeal swab, presence of viral mutation(s) within the areas targeted by this assay, and inadequate number of viral copies(<138 copies/mL). A negative result must be combined with clinical observations, patient history, and epidemiological information. The expected result is Negative.  Fact Sheet for Patients:  EntrepreneurPulse.com.au  Fact Sheet for Healthcare Providers:  IncredibleEmployment.be  This test is no t yet approved or cleared by the [place] FDA and  has been authorized for detection and/or diagnosis of SARS-CoV-2 by  FDA under an Emergency Use Authorization (EUA). This EUA will remain  in effect (meaning this test can be used) for the duration of the COVID-19 declaration under Section 564(b)(1) of the Act, 21 U.S.C.section 360bbb-3(b)(1), unless the authorization is terminated  or revoked sooner.       Influenza A by PCR NEGATIVE NEGATIVE   Influenza B by PCR NEGATIVE NEGATIVE    Comment: (NOTE) The Xpert Xpress SARS-CoV-2/FLU/RSV plus assay is intended as an aid in the diagnosis of influenza from Nasopharyngeal swab specimens  and should not be used as a sole basis for treatment. Nasal washings and aspirates are unacceptable for Xpert Xpress SARS-CoV-2/FLU/RSV testing.  Fact Sheet for Patients: EntrepreneurPulse.com.au  Fact Sheet for Healthcare Providers: IncredibleEmployment.be  This test is not yet approved or cleared by the [place] FDA and has been authorized for detection and/or diagnosis of SARS-CoV-2 by FDA under an Emergency Use Authorization (EUA). This EUA will remain in effect (meaning this test can be used) for the duration of the COVID-19 declaration under Section 564(b)(1) of the Act, 21 U.S.C. section 360bbb-3(b)(1), unless the authorization is terminated or revoked.  Performed at [facility], [place] [address], [place], [place] [address]    DG Foot Complete Right  Result Date: 12/03/2021 Please see detailed radiograph report in office note.   Review of Systems  Constitutional:  Negative for activity change, appetite change, chills, fatigue and fever.  HENT:  Negative for trouble swallowing.   Respiratory:  Negative for apnea, cough, chest tightness and shortness of breath.   Cardiovascular:  Negative for chest pain and leg swelling.  Gastrointestinal:  Negative for abdominal distention and abdominal pain.  Genitourinary:  Negative for difficulty urinating and frequency.  Musculoskeletal:  Positive for arthralgias (R toe pain).  Neurological:  Negative for dizziness, tremors, syncope and weakness.  Psychiatric/Behavioral:  The patient is not nervous/anxious.    Blood pressure (!) 156/86, pulse 64, temperature 98.9 F (37.2 C), temperature source Oral, resp. rate 11, height 6\' 2"  (1.88 m), weight 127 kg, SpO2 99 %. Physical Exam Constitutional:      General: He is not in acute distress.    Appearance: Normal appearance.  HENT:     Head: Normocephalic and atraumatic.  Eyes:     Extraocular Movements: Extraocular movements intact.      Conjunctiva/sclera: Conjunctivae normal.  Cardiovascular:     Rate and Rhythm: Normal rate and regular rhythm.  Pulmonary:     Effort: Pulmonary effort is normal.     Breath sounds: Normal breath sounds.  Abdominal:     General: Abdomen is flat.     Palpations: Abdomen is soft.  Musculoskeletal:     Cervical back: Normal range of motion and neck supple.     Comments: L BKA, R s/p toe amputations to 1st toe, 3rd toe and distal foot bandaged but see photos below  Skin:    General: Skin is warm and dry.  Neurological:     General: No focal deficit present.     Mental Status: He is alert and oriented to person, place, and time.  Psychiatric:        Mood and Affect: Mood normal.        Behavior: Behavior normal.        Thought Content: Thought content normal.        Judgment: Judgment normal.           Emeterio Reeve, DO 12/03/2021, 9:13 PM

## 2021-12-03 NOTE — Assessment & Plan Note (Signed)
-  [name] and MRI currently pending -Podiatry consulted, likely 2 OR in a.m. pending MRI results, patient n.p.o. after midnight -Based on previous culture results, continue vancomycin and cefepime

## 2021-12-03 NOTE — ED Triage Notes (Signed)
The pt is c/o a black ens of his 3rd toe  he has been having trouble with it for several days  he was seen in a doctors office today

## 2021-12-03 NOTE — Assessment & Plan Note (Signed)
-   Restarted home atorvastatin

## 2021-12-03 NOTE — ED Notes (Signed)
Patient transported to MRI 

## 2021-12-03 NOTE — Assessment & Plan Note (Addendum)
-   Restarted home medications: Amlodipine 10 mg daily, lisinopril 40 mg daily, hydralazine 10 mg 3 times daily

## 2021-12-03 NOTE — Progress Notes (Addendum)
Pharmacy Antibiotic Note  Eugene Diaz is a 56 y.o. male admitted on 12/03/2021 with  osteomyelitis .  Pharmacy has been consulted for vancomycin dosing.  Scr 0.93 Weight 127 kg Previous wound culture: e coli/prevotella and strep agalactiae  Plan: Vancomycin 2500 mg LD x1 followed by vancomycin 1500mg  q12hr (eAUC 525) Plan to obtain steady state Cefepime 2gm q8hr Will monitor for acute changes in renal function and adjust as needed F/u cultures results and de-escalate as appropriate  Height: 6\' 2"  (188 cm) Weight: 127 kg (279 lb 15.8 oz) IBW/kg (Calculated) : 82.2  Temp (24hrs), Avg:98.9 F (37.2 C), Min:98.9 F (37.2 C), Max:98.9 F (37.2 C)  Recent Labs  Lab 11/29/21 1049 12/03/21 1558  WBC 6.9 9.2  CREATININE 0.91 0.93  LATICACIDVEN 0.8 1.3    Estimated Creatinine Clearance: 125.6 mL/min (by C-G formula based on SCr of 0.93 mg/dL).    No Known Allergies  Thank you for allowing pharmacy to be a part of this patients care.  Donnald Garre, PharmD Clinical Pharmacist  Please check AMION for all [place] numbers After 10:00 PM, call [facility] [phone]

## 2021-12-03 NOTE — ED Provider Triage Note (Signed)
Emergency Medicine Provider Triage Evaluation Note  Eugene Diaz , a 56 y.o. male  was evaluated in triage.  Pt complains of left third toe pain.  He was evaluated with podiatrist, Dr. [name] today who completed an x-ray in the office that noted osteomyelitis, Dr. [name] recommended the patient come into the ED for further evaluation with labs, MRI imaging, and recommended admission to the hospital for IV antibiotics.  Patient has a history of diabetes and takes his medication as prescribed however notes that he does not check his blood sugars as he should at home.  Denies fever, chills, redness, foot pain.   Review of Systems  Positive: As per HPI above. Negative: Fever, chills  Physical Exam  BP (!) 160/97 (BP Location: Left Arm)    Pulse 76    Temp 98.9 F (37.2 C) (Oral)    Resp 16    Ht 6\' 2"  (1.88 m)    Wt 127 kg    SpO2 99%    BMI 35.95 kg/m  Gen:   Awake, no distress   Resp:  Normal effort  MSK:   Moves extremities without difficulty  Other:  See pictures below.       Medical Decision Making  Medically screening exam initiated at 3:50 PM.  Appropriate orders placed.  Eugene Diaz was informed that the remainder of the evaluation will be completed by another provider, this initial triage assessment does not replace that evaluation, and the importance of remaining in the ED until their evaluation is complete.   Eugene Diaz A, PA-C 12/03/21 1553

## 2021-12-03 NOTE — Assessment & Plan Note (Signed)
-  Currently well controlled, consider starting PPI/H2 blocker  -PRN Rx while inpatient

## 2021-12-03 NOTE — Assessment & Plan Note (Signed)
-  SSI while inpatient

## 2021-12-03 NOTE — Progress Notes (Signed)
Subjective:  Patient ID: Eugene Diaz, male    DOB: 1965/11/16,  MRN: [id number]  Chief Complaint  Patient presents with   Wound Check    Wound check     56 y.o. male presents with the above complaint.  Patient presents with a new complaint of right third digit foot infection.  Patient states that he went to the emergency room last week he was sent and discharged home as there was no concern.  He states that it is gotten worse there is more necrotic patch to it.  He wanted to get it evaluated.  He has not seen anyone else prior to seeing me.  He denies any other acute complaints.  He is known to [name].   Review of Systems: Negative except as noted in the HPI. Denies N/V/F/Ch.  Past Medical History:  Diagnosis Date   Alcohol abuse    Alcoholic peripheral neuropathy ([place])    Anxiety    Arthritis    Asthma    followed by pcp   B12 deficiency    Depression    Diabetic neuropathy ([place])    Edema of both lower extremities    Essential tremor    neurologist-- dr Angeligue Bowne--- due to alcohol abuse   GERD (gastroesophageal reflux disease)    History of cellulitis 2020   left lower leg;   recurrent 03-25-2020   History of esophageal stricture    s/p  dilatation 02-13-2018   History of osteomyelitis 2020   left great toe     Hypercholesterolemia    Hypertension    followed by pcp  (nuclear stress test 03-11-2014 low risk w/ no ischemia, ef 65%   Normocytic anemia    followed by pcp   (03-18-2020 had transfusion's 02/ 2021)   Osteomyelitis of great toe of right foot ([place]) 08/18/2021   Peripheral vascular disease ([place])    Severe depression ([place]) 06/09/2021   Status post incision and drainage followed by Dr [name] and pcp   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 (Patient not taking: Reported on 07/23/2021), Disp: 20 tablet, Rfl: 0   ALPRAZolam (XANAX) 1 MG tablet, Take 0.5 tablets (0.5 mg total) by mouth 4 (four) times daily as needed for anxiety. (Patient not taking: Reported on 10/26/2021), Disp: 12 tablet, Rfl: 0   amLODipine (NORVASC) 10 MG tablet, Take 1 tablet (10 mg total) by mouth daily. (Patient not taking: Reported on 06/09/2021), Disp: 90 tablet, Rfl: 1   amoxicillin-clavulanate (AUGMENTIN) 875-125 MG tablet, Take 1 tablet by mouth every 12 (twelve) hours., Disp: 14 tablet, Rfl: 0   aspirin EC 81 MG EC tablet, Take 1 tablet (81 mg total) by mouth daily. Swallow whole. (Patient not taking: Reported on 05/05/2021), Disp: 30 tablet, Rfl: 11   atorvastatin (LIPITOR) 40 MG tablet, Take 40 mg by mouth every evening.  (  Patient not taking: Reported on 03/27/2021), Disp: , Rfl: 10   diclofenac Sodium (VOLTAREN) 1 % GEL, Apply 1 application topically daily as needed (pain). (Patient not taking: Reported on 05/05/2021), Disp: , Rfl:    ferrous sulfate 325 (65 FE) MG tablet, Take 1 tablet (325 mg total) by mouth daily. (Patient not taking: Reported on 03/27/2021), Disp: 30 tablet, Rfl: 1   folic acid (FOLVITE) 1 MG tablet, Take 1 tablet (1 mg total) by mouth daily. (Patient not taking: Reported on 03/27/2021), Disp: 30 tablet, Rfl: 0   gabapentin (NEURONTIN) 300 MG capsule, TAKE 3 CAPSULES BY MOUTH IN THE MORNING, 3 CAPSULES AT NOON, AND 4 CAPSULES AT BEDTIME FOR NERVE PAIN. (Patient not taking: Reported on 03/27/2021), Disp: 300 capsule, Rfl: 1   glipiZIDE (GLUCOTROL) 5 MG tablet, Take 1 tablet (5 mg total) by mouth 2 (two) times daily.  (Patient not taking: No sig reported), Disp: 180 tablet, Rfl: 1   hydrALAZINE (APRESOLINE) 50 MG tablet, Take 1 tablet (50 mg total) by mouth every 8 (eight) hours. (Patient not taking: Reported on 03/27/2021), Disp: 90 tablet, Rfl: 0   lidocaine (XYLOCAINE) 5 % ointment, Apply topically 2 (two) times daily as needed. (Patient not taking: Reported on 06/09/2021), Disp: , Rfl:    lisinopril (ZESTRIL) 40 MG tablet, Take 1 tablet (40 mg total) by mouth at bedtime. (Patient not taking: Reported on 06/09/2021), Disp: 30 tablet, Rfl: 0   metFORMIN (GLUCOPHAGE) 500 MG tablet, Take 1 tablet (500 mg total) by mouth 2 (two) times daily with a meal. (Patient not taking: No sig reported), Disp: 180 tablet, Rfl: 1   morphine (MS CONTIN) 30 MG 12 hr tablet, Take 30 mg by mouth 2 (two) times daily as needed. (Patient not taking: Reported on 10/26/2021), Disp: , Rfl:    Multiple Vitamin (MULTIVITAMIN WITH MINERALS) TABS tablet, Take 1 tablet by mouth daily. (Patient not taking: Reported on 03/27/2021), Disp: , Rfl:    oxycodone (ROXICODONE) 30 MG immediate release tablet, Take 30 mg by mouth 4 (four) times daily as needed. (Patient not taking: Reported on 10/26/2021), Disp: , Rfl:    silver sulfADIAZINE (SILVADENE) 1 % cream, Apply pea-sized amount to wound daily. (Patient not taking: Reported on 07/23/2021), Disp: 50 g, Rfl: 0   thiamine 100 MG tablet, Take 1 tablet (100 mg total) by mouth daily. (Patient not taking: Reported on 03/27/2021), Disp: 30 tablet, Rfl: 0  Social History   Tobacco Use  Smoking Status Former   Packs/day: 2.50   Years: 25.00   Pack years: 62.50   Types: Cigarettes   Quit date: 08/05/1984   Years since quitting: 37.3  Smokeless Tobacco Never    No Known Allergies Objective:  There were no vitals filed for this visit. There is no height or weight on file to calculate BMI. Constitutional Well developed. Well nourished.  Vascular Dorsalis pedis pulses nonpalpable palpable.  BKA left  side Posterior tibial pulses nonpalpable palpable BKA left side Capillary refill diminished to to the right side No cyanosis or clubbing noted. Pedal hair not present  Neurologic Normal speech. Oriented to person, place, and time. Epicritic sensation to light touch grossly present BKA on the left side  Dermatologic Nails well groomed and normal in appearance. No open wounds. No skin lesions.  Orthopedic: Right third digit infection probing down to deep tissue.  Erythema noted up to the MPJ joint.  Malodor present.  Superficial purulent drainage noted.   Radiographs: 3 views of skeletally mature the  right foot: Possible cortical irregularity noted at the distal phalanx of the third digit.  These findings consistent with osteomyelitis.  No gas infection noted.  No other bony abnormalities identified.  Previous hallux amputation noted Assessment:   1. Ulcer of right foot, limited to breakdown of skin [facility])    Plan:  Patient was evaluated and treated and all questions answered.  Right third digit diabetic foot infection with underlying possible osteomyelitis with erythema -All questions and concerns were discussed with the patient in extensive detail -Given the radiographic finding as well as clinical finding I believe patient would benefit from admission to the hospital for IV antibiotics and possible surgical amputation of the third digit. -He will need an MRI of the right foot to assess for osteomyelitis. -We will also need ABIs PVRs to assess vascular flow to the lower extremity.  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 -Local wound care with Betadine wet-to-dry dressing -Case discussed with on-call physician -He is at high risk for developing wound complication and amputation with possible loss of foot or limb.   No follow-ups on file.

## 2021-12-03 NOTE — ED Provider Notes (Signed)
[facility] EMERGENCY DEPARTMENT Provider Note   CSN: [id number] Arrival date & time: 12/03/21  1522     History  Chief Complaint  Patient presents with   Toe Pain    Eugene Diaz is a 56 y.o. male.  Pt is a 56 yo male with pmh of diabetes with previous foot and toe amputations presenting for gangrenous right third digit. Patient denies pain. Denies swelling, warmth, or fevers.  Xray completed on 1/30 concerning for osteomyelitis .  The history is provided by the patient. No language interpreter was used.  Toe Pain Pertinent negatives include no chest pain, no abdominal pain and no shortness of breath.      Home Medications Prior to Admission medications   Medication Sig Start Date End Date Taking? Authorizing Provider  acetaminophen (TYLENOL) 325 MG tablet Take 2 tablets (650 mg total) by mouth every 6 (six) hours as needed for mild pain (or Fever >/= 101). Patient not taking: Reported on 07/23/2021 12/27/19   Arrien, [name], MD  ALPRAZolam Duanne Moron) 1 MG tablet Take 0.5 tablets (0.5 mg total) by mouth 4 (four) times daily as needed for anxiety. Patient not taking: Reported on 10/26/2021 01/21/20   Caren Griffins, MD  amLODipine (NORVASC) 10 MG tablet Take 1 tablet (10 mg total) by mouth daily. Patient not taking: Reported on 06/09/2021 04/03/21   Mercy Riding, MD  amoxicillin-clavulanate (AUGMENTIN) 875-125 MG tablet Take 1 tablet by mouth every 12 (twelve) hours. 11/29/21   Varney Biles, MD  aspirin EC 81 MG EC tablet Take 1 tablet (81 mg total) by mouth daily. Swallow whole. Patient not taking: Reported on 05/05/2021 04/03/21   Mercy Riding, MD  atorvastatin (LIPITOR) 40 MG tablet Take 40 mg by mouth every evening.  Patient not taking: Reported on 03/27/2021 12/17/15   [provider]  diclofenac Sodium (VOLTAREN) 1 % GEL Apply 1 application topically daily as needed (pain). Patient not taking: Reported on 05/05/2021    [provider]   ferrous sulfate 325 (65 FE) MG tablet Take 1 tablet (325 mg total) by mouth daily. Patient not taking: Reported on 03/27/2021 11/15/19 11/14/20  Shelly Coss, MD  folic acid (FOLVITE) 1 MG tablet Take 1 tablet (1 mg total) by mouth daily. Patient not taking: Reported on 03/27/2021 07/11/20   Love, Ivan Anchors, PA-C  gabapentin (NEURONTIN) 300 MG capsule TAKE 3 CAPSULES BY MOUTH IN THE MORNING, 3 CAPSULES AT NOON, AND 4 CAPSULES AT BEDTIME FOR NERVE PAIN. Patient not taking: Reported on 03/27/2021 07/03/20   Narda Amber K, DO  glipiZIDE (GLUCOTROL) 5 MG tablet Take 1 tablet (5 mg total) by mouth 2 (two) times daily. Patient not taking: No sig reported 04/02/21 09/29/21  Mercy Riding, MD  hydrALAZINE (APRESOLINE) 50 MG tablet Take 1 tablet (50 mg total) by mouth every 8 (eight) hours. Patient not taking: Reported on 03/27/2021 07/10/20   Love, Ivan Anchors, PA-C  lidocaine (XYLOCAINE) 5 % ointment Apply topically 2 (two) times daily as needed. Patient not taking: Reported on 06/09/2021 03/16/21   [provider]  lisinopril (ZESTRIL) 40 MG tablet Take 1 tablet (40 mg total) by mouth at bedtime. Patient not taking: Reported on 06/09/2021 07/10/20   Love, Ivan Anchors, PA-C  metFORMIN (GLUCOPHAGE) 500 MG tablet Take 1 tablet (500 mg total) by mouth 2 (two) times daily with a meal. Patient not taking: No sig reported 04/02/21 09/29/21  Mercy Riding, MD  morphine (MS CONTIN) 30  MG 12 hr tablet Take 30 mg by mouth 2 (two) times daily as needed. Patient not taking: Reported on 10/26/2021 08/16/21   [provider]  Multiple Vitamin (MULTIVITAMIN WITH MINERALS) TABS tablet Take 1 tablet by mouth daily. Patient not taking: Reported on 03/27/2021 07/11/20   Love, Ivan Anchors, PA-C  oxycodone (ROXICODONE) 30 MG immediate release tablet Take 30 mg by mouth 4 (four) times daily as needed. Patient not taking: Reported on 10/26/2021 08/16/21   [provider]  silver sulfADIAZINE (SILVADENE) 1 % cream Apply  pea-sized amount to wound daily. Patient not taking: Reported on 07/23/2021 06/11/21   Evelina Bucy, DPM  thiamine 100 MG tablet Take 1 tablet (100 mg total) by mouth daily. Patient not taking: Reported on 03/27/2021 07/11/20   Bary Leriche, PA-C      Allergies    Patient has no known allergies.    Review of Systems   Review of Systems  Constitutional:  Negative for chills and fever.  HENT:  Negative for ear pain and sore throat.   Eyes:  Negative for pain and visual disturbance.  Respiratory:  Negative for cough and shortness of breath.   Cardiovascular:  Negative for chest pain and palpitations.  Gastrointestinal:  Negative for abdominal pain and vomiting.  Genitourinary:  Negative for dysuria and hematuria.  Musculoskeletal:  Negative for arthralgias and back pain.  Skin:  Positive for wound. Negative for color change and rash.  Neurological:  Negative for seizures and syncope.  All other systems reviewed and are negative.  Physical Exam Updated Vital Signs BP (!) 160/97 (BP Location: Left Arm)    Pulse 76    Temp 98.9 F (37.2 C) (Oral)    Resp 16    Ht 6\' 2"  (1.88 m)    Wt 127 kg    SpO2 99%    BMI 35.95 kg/m  Physical Exam Vitals and nursing note reviewed.  Constitutional:      General: He is not in acute distress.    Appearance: He is well-developed.  HENT:     Head: Normocephalic and atraumatic.  Eyes:     Conjunctiva/sclera: Conjunctivae normal.  Cardiovascular:     Rate and Rhythm: Normal rate and regular rhythm.     Heart sounds: No murmur heard. Pulmonary:     Effort: Pulmonary effort is normal. No respiratory distress.     Breath sounds: Normal breath sounds.  Abdominal:     Palpations: Abdomen is soft.     Tenderness: There is no abdominal tenderness.  Musculoskeletal:        General: No swelling.     Cervical back: Neck supple.  Feet:     Comments: Gangrene present on right foot third digit. Evidence of amputation of right great toe and second digit.  Amputation of left leg present. Skin:    General: Skin is warm and dry.     Capillary Refill: Capillary refill takes less than 2 seconds.  Neurological:     Mental Status: He is alert.  Psychiatric:        Mood and Affect: Mood normal.    ED Results / Procedures / Treatments   Labs (all labs ordered are listed, but only abnormal results are displayed) Labs Reviewed  COMPREHENSIVE METABOLIC PANEL - Abnormal; Notable for the following components:      Result Value   Sodium 134 (*)    Potassium 3.4 (*)    Glucose, Bld 152 (*)    Total Protein  8.8 (*)    All other components within normal limits  CBC WITH DIFFERENTIAL/PLATELET - Abnormal; Notable for the following components:   RBC 4.02 (*)    Hemoglobin 11.8 (*)    HCT 35.3 (*)    Platelets 427 (*)    Abs Immature Granulocytes 0.10 (*)    All other components within normal limits  CULTURE, BLOOD (ROUTINE X 2)  CULTURE, BLOOD (ROUTINE X 2)  LACTIC ACID, PLASMA  LACTIC ACID, PLASMA    EKG None  Radiology DG Foot Complete Right  Result Date: 12/03/2021 Please see detailed radiograph report in office note.   Procedures Procedures    Medications Ordered in ED Medications - No data to display  ED Course/ Medical Decision Making/ A&P                           Medical Decision Making Amount and/or Complexity of Data Reviewed Labs: ordered. Radiology: ordered. ECG/medicine tests: ordered.  Risk Prescription drug management. Decision regarding hospitalization.   4:56 PM 56 yo male with pmh of diabetes with previous foot and toe amputations presenting for gangrenous right fourth digit.   -Xray on 1/30 demonstrates osteomyalitis.  -Pt sent in by podiatry for admission and possibly amputation tomorrow.  -MRI ordered -ABIs ordered -Vancomycin started and cefepime for cultures positive for e coli and resistant prevotella in his deep wound culture in the past.   I spoke with on call podiatrist [name] who  requests hospitalist admission.         Final Clinical Impression(s) / ED Diagnoses Final diagnoses:  Osteomyelitis of right foot, unspecified type ([place])  Gangrene [facility])    Rx / DC Orders ED Discharge Orders     None         Lianne Cure, DO 19/41/74 [id number]

## 2021-12-04 ENCOUNTER — Encounter (HOSPITAL_COMMUNITY)
Admission: EM | Disposition: A | Discharge status: Home Health | Payer: Self-pay | Source: Home / Self Care | Attending: Internal Medicine

## 2021-12-04 ENCOUNTER — Encounter (HOSPITAL_COMMUNITY): Payer: Self-pay | Admitting: Osteopathic Medicine

## 2021-12-04 ENCOUNTER — Encounter (HOSPITAL_COMMUNITY): Payer: Medicare Other

## 2021-12-04 ENCOUNTER — Inpatient Hospital Stay (HOSPITAL_COMMUNITY): Payer: Medicare Other

## 2021-12-04 ENCOUNTER — Inpatient Hospital Stay (HOSPITAL_COMMUNITY): Payer: Medicare Other | Admitting: Certified Registered Nurse Anesthetist

## 2021-12-04 ENCOUNTER — Other Ambulatory Visit: Payer: Self-pay

## 2021-12-04 DIAGNOSIS — I96 Gangrene, not elsewhere classified: Secondary | ICD-10-CM

## 2021-12-04 DIAGNOSIS — M86171 Other acute osteomyelitis, right ankle and foot: Secondary | ICD-10-CM

## 2021-12-04 DIAGNOSIS — M24574 Contracture, right foot: Secondary | ICD-10-CM

## 2021-12-04 DIAGNOSIS — E1152 Type 2 diabetes mellitus with diabetic peripheral angiopathy with gangrene: Secondary | ICD-10-CM | POA: Diagnosis not present

## 2021-12-04 HISTORY — PX: TENDON LENGTHENING: SHX6786

## 2021-12-04 HISTORY — PX: TRANSMETATARSAL AMPUTATION: SHX6197

## 2021-12-04 LAB — HEMOGLOBIN A1C
Hgb A1c MFr Bld: 5.8 % — ABNORMAL HIGH (ref 4.8–5.6)
Mean Plasma Glucose: 119.76 mg/dL

## 2021-12-04 LAB — BASIC METABOLIC PANEL
Anion gap: 9 (ref 5–15)
BUN: 9 mg/dL (ref 6–20)
CO2: 21 mmol/L — ABNORMAL LOW (ref 22–32)
Calcium: 8.7 mg/dL — ABNORMAL LOW (ref 8.9–10.3)
Chloride: 105 mmol/L (ref 98–111)
Creatinine, Ser: 0.83 mg/dL (ref 0.61–1.24)
GFR, Estimated: >60 mL/min (ref >60)
Glucose, Bld: 121 mg/dL — ABNORMAL HIGH (ref 70–99)
Potassium: 3.4 mmol/L — ABNORMAL LOW (ref 3.5–5.1)
Sodium: 135 mmol/L (ref 135–145)

## 2021-12-04 LAB — CBC
HCT: 30.7 % — ABNORMAL LOW (ref 39.0–52.0)
Hemoglobin: 10.3 g/dL — ABNORMAL LOW (ref 13.0–17.0)
MCH: 29.8 pg (ref 26.0–34.0)
MCHC: 33.6 g/dL (ref 30.0–36.0)
MCV: 88.7 fL (ref 80.0–100.0)
Platelets: 352 10*3/uL (ref 150–400)
RBC: 3.46 MIL/uL — ABNORMAL LOW (ref 4.22–5.81)
RDW: 13.8 % (ref 11.5–15.5)
WBC: 9.4 10*3/uL (ref 4.0–10.5)
nRBC: 0 % (ref 0.0–0.2)

## 2021-12-04 LAB — GLUCOSE, CAPILLARY
Glucose-Capillary: 112 mg/dL — ABNORMAL HIGH (ref 70–99)
Glucose-Capillary: 104 mg/dL — ABNORMAL HIGH (ref 70–99)
Glucose-Capillary: 130 mg/dL — ABNORMAL HIGH (ref 70–99)
Glucose-Capillary: 139 mg/dL — ABNORMAL HIGH (ref 70–99)

## 2021-12-04 LAB — SURGICAL PCR SCREEN
MRSA, PCR: POSITIVE — AB
Staphylococcus aureus: POSITIVE — AB

## 2021-12-04 LAB — CULTURE, BLOOD (ROUTINE X 2)
Culture: NO GROWTH
Special Requests: ADEQUATE

## 2021-12-04 LAB — MAGNESIUM: Magnesium: 1.7 mg/dL (ref 1.7–2.4)

## 2021-12-04 SURGERY — AMPUTATION, FOOT, TRANSMETATARSAL
Anesthesia: Monitor Anesthesia Care | Site: Toe | Laterality: Right

## 2021-12-04 MED ORDER — ACETAMINOPHEN 325 MG PO TABS
650.0000 mg | ORAL_TABLET | Freq: Four times a day (QID) | ORAL | Status: DC | PRN
  Administered 2021-12-04: 650 mg via ORAL
  Filled 2021-12-04: qty 2

## 2021-12-04 MED ORDER — LIDOCAINE HCL (PF) 1 % IJ SOLN
INTRAMUSCULAR | Status: DC | PRN
  Administered 2021-12-04 (×2): 10 mL via SUBCUTANEOUS

## 2021-12-04 MED ORDER — FENTANYL CITRATE (PF) 250 MCG/5ML IJ SOLN
INTRAMUSCULAR | Status: DC | PRN
  Administered 2021-12-04: 50 ug via INTRAVENOUS

## 2021-12-04 MED ORDER — 0.9 % SODIUM CHLORIDE (POUR BTL) OPTIME
TOPICAL | Status: DC | PRN
Start: 2021-12-04 — End: 2021-12-04
  Administered 2021-12-04: 1000 mL

## 2021-12-04 MED ORDER — MIDAZOLAM HCL 2 MG/2ML IJ SOLN
INTRAMUSCULAR | Status: AC
  Filled 2021-12-04: qty 2

## 2021-12-04 MED ORDER — MIDAZOLAM HCL 2 MG/2ML IJ SOLN
INTRAMUSCULAR | Status: DC | PRN
Start: 2021-12-04 — End: 2021-12-04
  Administered 2021-12-04: 2 mg via INTRAVENOUS

## 2021-12-04 MED ORDER — LIDOCAINE HCL (PF) 1 % IJ SOLN
INTRAMUSCULAR | Status: AC
  Filled 2021-12-04: qty 30

## 2021-12-04 MED ORDER — MUPIROCIN 2 % EX OINT
1.0000 "application " | TOPICAL_OINTMENT | Freq: Two times a day (BID) | CUTANEOUS | Status: DC
  Administered 2021-12-04 – 2021-12-07 (×6): 1 via NASAL
  Filled 2021-12-04 (×3): qty 22

## 2021-12-04 MED ORDER — BUPIVACAINE HCL (PF) 0.25 % IJ SOLN
INTRAMUSCULAR | Status: AC
  Filled 2021-12-04: qty 30

## 2021-12-04 MED ORDER — ONDANSETRON HCL 4 MG/2ML IJ SOLN
INTRAMUSCULAR | Status: DC | PRN
Start: 2021-12-04 — End: 2021-12-04
  Administered 2021-12-04: 4 mg via INTRAVENOUS

## 2021-12-04 MED ORDER — BUPIVACAINE HCL 0.25 % IJ SOLN
INTRAMUSCULAR | Status: DC | PRN
  Administered 2021-12-04 (×2): 10 mL

## 2021-12-04 MED ORDER — PROPOFOL 500 MG/50ML IV EMUL
INTRAVENOUS | Status: DC | PRN
  Administered 2021-12-04: 100 ug/kg/min via INTRAVENOUS

## 2021-12-04 MED ORDER — FENTANYL CITRATE (PF) 250 MCG/5ML IJ SOLN
INTRAMUSCULAR | Status: AC
  Filled 2021-12-04: qty 5

## 2021-12-04 MED ORDER — POTASSIUM CHLORIDE CRYS ER 20 MEQ PO TBCR
40.0000 meq | EXTENDED_RELEASE_TABLET | Freq: Two times a day (BID) | ORAL | Status: AC
  Administered 2021-12-04 (×2): 40 meq via ORAL
  Filled 2021-12-04 (×2): qty 2

## 2021-12-04 MED ORDER — ORAL CARE MOUTH RINSE: 15.0000 mL | Freq: Once | OROMUCOSAL | Status: AC

## 2021-12-04 MED ORDER — CHLORHEXIDINE GLUCONATE 0.12 % MT SOLN: 15.0000 mL | Freq: Once | OROMUCOSAL | Status: AC

## 2021-12-04 MED ORDER — LACTATED RINGERS IV SOLN: INTRAVENOUS | Status: DC

## 2021-12-04 MED ORDER — CHLORHEXIDINE GLUCONATE CLOTH 2 % EX PADS
6.0000 | MEDICATED_PAD | Freq: Every day | CUTANEOUS | Status: DC
  Administered 2021-12-04 – 2021-12-07 (×4): 6 via TOPICAL

## 2021-12-04 MED ORDER — CHLORHEXIDINE GLUCONATE 0.12 % MT SOLN
OROMUCOSAL | Status: AC
  Administered 2021-12-04: 15 mL via OROMUCOSAL
  Filled 2021-12-04: qty 15

## 2021-12-04 MED ORDER — PROPOFOL 10 MG/ML IV BOLUS
INTRAVENOUS | Status: AC
  Filled 2021-12-04: qty 20

## 2021-12-04 SURGICAL SUPPLY — 42 items
BAG COUNTER SPONGE SURGICOUNT (BAG) ×3 IMPLANT
BAG SPNG CNTER NS LX DISP (BAG) ×2
BLADE AVERAGE 25X9 (BLADE) ×1 IMPLANT
BLADE SAW SGTL NAR THIN XSHT (BLADE) ×3 IMPLANT
BLADE SURG 10 STRL SS (BLADE) ×3 IMPLANT
BNDG CMPR 9X4 STRL LF SNTH (GAUZE/BANDAGES/DRESSINGS) ×2
BNDG CMPR STD VLCR NS LF 5.8X4 (GAUZE/BANDAGES/DRESSINGS) ×2
BNDG COHESIVE 4X5 TAN ST LF (GAUZE/BANDAGES/DRESSINGS) ×1 IMPLANT
BNDG COHESIVE 4X5 TAN STRL (GAUZE/BANDAGES/DRESSINGS) ×3 IMPLANT
BNDG ELASTIC 4X5.8 VLCR NS LF (GAUZE/BANDAGES/DRESSINGS) ×1 IMPLANT
BNDG ELASTIC 4X5.8 VLCR STR LF (GAUZE/BANDAGES/DRESSINGS) ×3 IMPLANT
BNDG ESMARK 4X9 LF (GAUZE/BANDAGES/DRESSINGS) ×3 IMPLANT
BNDG GAUZE ELAST 4 BULKY (GAUZE/BANDAGES/DRESSINGS) ×3 IMPLANT
COVER SURGICAL LIGHT HANDLE (MISCELLANEOUS) ×3 IMPLANT
CUFF TOURN SGL QUICK 18X4 (TOURNIQUET CUFF) ×3 IMPLANT
CUFF TOURN SGL QUICK 24 (TOURNIQUET CUFF) ×3
CUFF TRNQT CYL 24X4X16.5-23 (TOURNIQUET CUFF) ×2 IMPLANT
DRSG PAD ABDOMINAL 8X10 ST (GAUZE/BANDAGES/DRESSINGS) ×3 IMPLANT
DURAPREP 26ML APPLICATOR (WOUND CARE) ×3 IMPLANT
GAUZE SPONGE 4X4 12PLY STRL (GAUZE/BANDAGES/DRESSINGS) ×1 IMPLANT
GAUZE SPONGE 4X4 12PLY STRL LF (GAUZE/BANDAGES/DRESSINGS) ×3 IMPLANT
GAUZE XEROFORM 5X9 LF (GAUZE/BANDAGES/DRESSINGS) ×1 IMPLANT
GLOVE SURG ENC MOIS LTX SZ6.5 (GLOVE) ×3 IMPLANT
GLOVE SURG UNDER LTX SZ6.5 (GLOVE) ×3 IMPLANT
GOWN STRL REUS W/ TWL LRG LVL3 (GOWN DISPOSABLE) ×4 IMPLANT
GOWN STRL REUS W/TWL LRG LVL3 (GOWN DISPOSABLE) ×6
KIT BASIN OR (CUSTOM PROCEDURE TRAY) ×3 IMPLANT
KIT TURNOVER KIT B (KITS) ×3 IMPLANT
NDL HYPO 25GX1X1/2 BEV (NEEDLE) ×2 IMPLANT
NEEDLE HYPO 25GX1X1/2 BEV (NEEDLE) ×3 IMPLANT
NS IRRIG 1000ML POUR BTL (IV SOLUTION) ×3 IMPLANT
PACK ORTHO EXTREMITY (CUSTOM PROCEDURE TRAY) ×3 IMPLANT
PAD ABD 7.5X8 STRL (GAUZE/BANDAGES/DRESSINGS) ×1 IMPLANT
PAD ARMBOARD 7.5X6 YLW CONV (MISCELLANEOUS) ×6 IMPLANT
SUT PROLENE 3 0 PS 2 (SUTURE) ×7 IMPLANT
SUT VIC AB 2-0 SH 27 (SUTURE) ×3
SUT VIC AB 2-0 SH 27XBRD (SUTURE) ×2 IMPLANT
SWAB COLLECTION DEVICE MRSA (MISCELLANEOUS) ×1 IMPLANT
SWAB CULTURE ESWAB REG 1ML (MISCELLANEOUS) ×1 IMPLANT
SYR 20ML ECCENTRIC (SYRINGE) ×2 IMPLANT
SYR CONTROL 10ML LL (SYRINGE) ×3 IMPLANT
TUBE CONNECTING 12X1/4 (SUCTIONS) ×3 IMPLANT

## 2021-12-04 NOTE — ED Notes (Signed)
Attempted to call report the Nurse will call back for report.

## 2021-12-04 NOTE — Anesthesia Postprocedure Evaluation (Signed)
Anesthesia Post Note  Patient: Eugene Diaz  Procedure(s) Performed: TRANSMETATARSAL AMPUTATION (Right: Toe) TENDON LENGTHENING (Right)     Patient location during evaluation: PACU Anesthesia Type: MAC Level of consciousness: awake and alert Pain management: pain level controlled Vital Signs Assessment: post-procedure vital signs reviewed and stable Respiratory status: spontaneous breathing, nonlabored ventilation, respiratory function stable and patient connected to nasal cannula oxygen Cardiovascular status: stable and blood pressure returned to baseline Postop Assessment: no apparent nausea or vomiting Anesthetic complications: no   No notable events documented.  Last Vitals:  Vitals:   12/04/21 1355 12/04/21 1410  BP: (!) 145/74   Pulse: 62   Resp: 16   Temp:  36.5 C  SpO2: 99%     Last Pain:  Vitals:   12/04/21 1410  TempSrc:   PainSc: 0-No pain        RLE Motor Response: Purposeful movement (12/04/21 1410) RLE Sensation: Decreased (12/04/21 1410)      Effie Berkshire

## 2021-12-04 NOTE — Progress Notes (Signed)
PROGRESS NOTE    Eugene Diaz  VEL:[id number] DOB: [birth year] DOA: 12/03/2021 PCP: Ginger Organ   Brief Narrative:  56 year old male with past medical history of diabetes, previous foot/toe amputations including left BKA, right first toe.  Presents with gangrenous right third toe.  Reports has been painful for some time, "looking pretty bad" and he went to podiatry outpatient today for wound check, after having been treated in the ED last week and noting worsening of the toe.  Podiatry advised he go to the ED, likely would need admission for IV antibiotics and possible surgical amputation of the third digit.  MRI, ABIs ordered.    ED course: Mild hypertension up to 258 systolic, 97 diastolic, patient not on home medications.  White cell count within normal limits, mild low hemoglobin of 11.8, mild low sodium 134, mild low potassium 3.4, glucose controlled at 152, blood cultures obtained, x-ray right foot shows concerning for osteomyelitis.  MRI right foot shows osteomyelitis of right third toe.  Podiatrist consulted.  Patient started on IV cefepime and vancomycin.  [facility] hospitalist consulted for admission for further evaluation and management.  Assessment & Plan:  Gangrene right third toe associated with type 2 diabetes mellitus  Osteomyelitis: -Reviewed MRI which showed right third great toe osteomyelitis. -Continue IV cefepime and vancomycin.  Blood culture is obtained and is pending.  He is afebrile, no leukocytosis.  Lactic acid: WNL, no signs of sepsis on admission. -Podiatrist consulted.  Patient is n.p.o. for procedure today-transmetatarsal amputation right foot with possible Achilles lengthening. -ABIs still pending-May need vascular surgery evaluation based on [name] results. -Continue as needed pain medications   Hypertension associated with type 2 diabetes mellitus (HCC) -Continue amlodipine 10 mg daily, lisinopril 40 mg daily, hydralazine 10 mg 3 times daily -Monitor  blood pressure closely   Hyperlipidemia associated with type 2 diabetes mellitus ([place]) -Continue atorvastatin   Type 2 diabetes mellitus ([place]) -Well-controlled.  A1c 5.8%.  Continue sliding scale insulin and monitor blood sugar closely   Anemia, chronic disease -stable.  Monitor.  Hypokalemia: Replenished.  Repeat BMP tomorrow a.m.  Check magnesium level.     DVT prophylaxis: SCD Code Status: Full code Family Communication:  None present at bedside.  Plan of care discussed with patient in length and he verbalized understanding and agreed with it. Disposition Plan: To be determined  Consultants:  Podiatrist  Procedures:  None  Antimicrobials:  Cefepime Vancomycin  Status is: Inpatient    Subjective: Patient seen and examined.  Sitting comfortably on the bed.  He denies any pain, fever, chills.  Awaiting for the surgery.  Objective: Vitals:   12/04/21 0545 12/04/21 0615 12/04/21 0630 12/04/21 0828  BP: (!) 120/58 139/82 (!) 144/71 (!) 148/93  Pulse: (!) 58 60 (!) 58 (!) 58  Resp: 17  17 15   Temp:      TempSrc:      SpO2: 97% 99% 99% 100%  Weight:      Height:        Intake/Output Summary (Last 24 hours) at 12/04/2021 1119 Last data filed at 12/04/2021 5277 Gross per 24 hour  Intake 1003.12 ml  Output 900 ml  Net 103.12 ml   Filed Weights   12/03/21 1547  Weight: 127 kg    Examination:  General exam: Appears calm and comfortable, on room air, communicating well Respiratory system: Clear to auscultation. Respiratory effort normal. Cardiovascular system: S1 & S2 heard, RRR. No JVD, murmurs, rubs, gallops or clicks. No pedal  edema. Gastrointestinal system: Abdomen is nondistended, soft and nontender. No organomegaly or masses felt. Normal bowel sounds heard. Central nervous system: Alert and oriented. No focal neurological deficits. Extremities: Left BKA   Psychiatry: Judgement and insight appear normal. Mood & affect appropriate.    Data Reviewed: I  have personally reviewed following labs and imaging studies  CBC: Recent Labs  Lab 11/29/21 1049 12/03/21 1558 12/04/21 0301  WBC 6.9 9.2 9.4  NEUTROABS 4.3 5.1  --   HGB 10.3* 11.8* 10.3*  HCT 30.6* 35.3* 30.7*  MCV 87.9 87.8 88.7  PLT 353 427* 008   Basic Metabolic Panel: Recent Labs  Lab 11/29/21 1049 12/03/21 1558 12/04/21 0301  NA 135 134* 135  K 3.9 3.4* 3.4*  CL 100 106 105  CO2 27 22 21*  GLUCOSE 141* 152* 121*  BUN 13 10 9   CREATININE 0.91 0.93 0.83  CALCIUM 9.4 9.1 8.7*   GFR: Estimated Creatinine Clearance: 140.7 mL/min (by C-G formula based on SCr of 0.83 mg/dL). Liver Function Tests: Recent Labs  Lab 11/29/21 1049 12/03/21 1558  AST 14* 29  ALT 9 18  ALKPHOS 65 78  BILITOT 0.6 0.4  PROT 8.3* 8.8*  ALBUMIN 3.0* 3.5   No results for input(s): LIPASE, AMYLASE in the last 168 hours. No results for input(s): AMMONIA in the last 168 hours. Coagulation Profile: No results for input(s): INR, PROTIME in the last 168 hours. Cardiac Enzymes: No results for input(s): CKTOTAL, CKMB, CKMBINDEX, TROPONINI in the last 168 hours. BNP (last 3 results) No results for input(s): PROBNP in the last 8760 hours. HbA1C: Recent Labs    12/04/21 0301  HGBA1C 5.8*   CBG: Recent Labs  Lab 12/03/21 2352 12/04/21 0904  GLUCAP 135* 112*   Lipid Profile: No results for input(s): CHOL, HDL, LDLCALC, TRIG, CHOLHDL, LDLDIRECT in the last 72 hours. Thyroid Function Tests: No results for input(s): TSH, T4TOTAL, FREET4, T3FREE, THYROIDAB in the last 72 hours. Anemia Panel: No results for input(s): VITAMINB12, FOLATE, FERRITIN, TIBC, IRON, RETICCTPCT in the last 72 hours. Sepsis Labs: Recent Labs  Lab 11/29/21 1049 12/03/21 1558 12/03/21 1733  LATICACIDVEN 0.8 1.3 1.0    Recent Results (from the past 240 hour(s))  Blood Cultures x 2 sites     Status: None   Collection Time: 11/29/21 11:24 AM   Specimen: BLOOD LEFT HAND  Result Value Ref Range Status    Specimen Description BLOOD LEFT HAND  Final   Special Requests   Final    BOTTLES DRAWN AEROBIC AND ANAEROBIC Blood Culture adequate volume   Culture   Final    NO GROWTH 5 DAYS Performed at [facility], [place] [address] [place], [place] [address]    Report Status 12/04/2021 FINAL  Final  Culture, blood (routine x 2)     Status: None (Preliminary result)   Collection Time: 12/03/21  3:58 PM   Specimen: BLOOD  Result Value Ref Range Status   Specimen Description BLOOD SITE NOT SPECIFIED  Final   Special Requests   Final    BOTTLES DRAWN AEROBIC AND ANAEROBIC Blood Culture adequate volume   Culture   Final    NO GROWTH < 24 HOURS Performed at [facility], [place] [address], [place], [place] [address]    Report Status PENDING  Incomplete  Culture, blood (routine x 2)     Status: None (Preliminary result)   Collection Time: 12/03/21  5:25 PM   Specimen: BLOOD  Result Value  Ref Range Status   Specimen Description BLOOD RIGHT ANTECUBITAL  Final   Special Requests   Final    BOTTLES DRAWN AEROBIC AND ANAEROBIC Blood Culture results may not be optimal due to an inadequate volume of blood received in culture bottles   Culture   Final    NO GROWTH < 24 HOURS Performed at [facility] [address], [place], [place] [address]    Report Status PENDING  Incomplete  Resp Panel by RT-PCR (Flu A&B, Covid) Nasopharyngeal Swab     Status: None   Collection Time: 12/03/21  6:25 PM   Specimen: Nasopharyngeal Swab; Nasopharyngeal(NP) swabs in vial transport medium  Result Value Ref Range Status   SARS Coronavirus 2 by RT PCR NEGATIVE NEGATIVE Final    Comment: (NOTE) SARS-CoV-2 target nucleic acids are NOT DETECTED.  The SARS-CoV-2 RNA is generally detectable in upper respiratory specimens during the acute phase of infection. The lowest concentration of SARS-CoV-2 viral copies this assay can detect is 138 copies/mL. A negative result does not preclude SARS-Cov-2 infection and  should not be used as the sole basis for treatment or other patient management decisions. A negative result may occur with  improper specimen collection/handling, submission of specimen other than nasopharyngeal swab, presence of viral mutation(s) within the areas targeted by this assay, and inadequate number of viral copies(<138 copies/mL). A negative result must be combined with clinical observations, patient history, and epidemiological information. The expected result is Negative.  Fact Sheet for Patients:  EntrepreneurPulse.com.au  Fact Sheet for Healthcare Providers:  IncredibleEmployment.be  This test is no t yet approved or cleared by the [place] FDA and  has been authorized for detection and/or diagnosis of SARS-CoV-2 by FDA under an Emergency Use Authorization (EUA). This EUA will remain  in effect (meaning this test can be used) for the duration of the COVID-19 declaration under Section 564(b)(1) of the Act, 21 U.S.C.section 360bbb-3(b)(1), unless the authorization is terminated  or revoked sooner.       Influenza A by PCR NEGATIVE NEGATIVE Final   Influenza B by PCR NEGATIVE NEGATIVE Final    Comment: (NOTE) The Xpert Xpress SARS-CoV-2/FLU/RSV plus assay is intended as an aid in the diagnosis of influenza from Nasopharyngeal swab specimens and should not be used as a sole basis for treatment. Nasal washings and aspirates are unacceptable for Xpert Xpress SARS-CoV-2/FLU/RSV testing.  Fact Sheet for Patients: EntrepreneurPulse.com.au  Fact Sheet for Healthcare Providers: IncredibleEmployment.be  This test is not yet approved or cleared by the [place] FDA and has been authorized for detection and/or diagnosis of SARS-CoV-2 by FDA under an Emergency Use Authorization (EUA). This EUA will remain in effect (meaning this test can be used) for the duration of the COVID-19 declaration  under Section 564(b)(1) of the Act, 21 U.S.C. section 360bbb-3(b)(1), unless the authorization is terminated or revoked.  Performed at [facility], [place] [address] [place], [place] [address]       Radiology Studies: MR FOOT RIGHT W WO CONTRAST  Result Date: 12/03/2021 CLINICAL DATA:  Gangrene of the right third toe. EXAM: MRI OF THE RIGHT FOREFOOT WITHOUT AND WITH CONTRAST TECHNIQUE: Multiplanar, multisequence MR imaging of the right forefoot was performed before and after the administration of intravenous contrast. CONTRAST:  33mL GADAVIST GADOBUTROL 1 MMOL/ML IV SOLN COMPARISON:  Radiography same day FINDINGS: Study is degraded by patient motion. There has been distant amputation of the great toe at the proximal metatarsal level. No complicating feature  seen in that location. Second toe appears negative for osteomyelitis. There are some degenerative changes at the metatarsal phalangeal joint. Third toe shows abnormal edema and enhancement of the phalangeal consistent with osteomyelitis. No abnormal signal seen affecting the metatarsal head or more proximal metatarsal. Fourth and fifth toes are negative. No evidence of soft tissue fluid collection. IMPRESSION: Abnormal edema and enhancement of the phalangeal of the third toe consistent with osteomyelitis. No abnormal signal seen affecting the third toe metatarsal. See above for full discussion. Electronically Signed   By: Nelson Chimes M.D.   On: 12/03/2021 21:59   DG Foot Complete Right  Result Date: 12/03/2021 Please see detailed radiograph report in office note.   Scheduled Meds:  amLODipine  10 mg Oral Daily   atorvastatin  40 mg Oral QPM   insulin aspart  0-15 Units Subcutaneous TID WC   insulin aspart  0-5 Units Subcutaneous QHS   lisinopril  40 mg Oral Daily   morphine  30 mg Oral Q12H   sodium chloride flush  3 mL Intravenous Q12H   Continuous Infusions:  sodium chloride     ceFEPime (MAXIPIME) IV Stopped (12/04/21 0305)    vancomycin Stopped (12/04/21 0758)     LOS: 1 day   Time spent: 35 minutes   Michell Kader Loann Quill, MD [facility]  If 7PM-7AM, please contact night-coverage [email or web address] 12/04/2021, 11:19 AM

## 2021-12-04 NOTE — Op Note (Signed)
DATE: 12-04-21  SURGEON: Landis Martins, DPM  PREOPERATIVE DIAGNOSIS: Previous Right hallux toe amp, 3rd toe gangrene, osteomyelitis, cellulitis, PAD, DFI with equinus  POSTOPERATIVE DIAGNOSIS: Same  PROCEDURE PERFORMED:Right TMA and TAL   HEMOSTASIS:  Right  ankle tourniquet  ESTIMATED BLOOD LOSS:  Minimal  ANESTHESIA:  General with local 20cc of 1:1 mixture 1% lidocaine and 0.5% marcaine plain, pre and post   SPECIMENS: Wound swab- micro, Gross specimen, Right partial foot- Path  COMPLICATIONS:  None.  INDICATIONS FOR PROCEDURE:  This patient is a pleasant 56 y.o. diabetic Male who has been seen in for infected 3rd toe with tissue necrosis with history of gangrene and osteomyelitis on the right foot.  Patient opt for amputation. Risks and complications include but are not limited to infection, recurrence of symptoms, pain, numbness, wound dehiscence, delayed healing, as well as need for future surgery/further amputation.  No guarantees were given or applied.  All questions were answered to the patients satisfaction, and the patient has consented to the above procedure.  All preoperative labs and H&P, medical clearances have been obtained and NPO status has been confirmed.  PROCEDURE IN DETAIL: After suitable general anesthesia, the patient's Right lower extremity, with a tourniquet cuff in place, was prepped and draped in the usual sterile fashion. First, the outline of the amputation flap was made on the dorsum. It is of note 3rd toe had gangrene and previous 1st toe amputation. The outline was marked out, starting on the medial side, mid lateral forefoot, and curved dorsally to the mid lateral part of the lateral border of the foot. On the plantar surface, the plantar flap outline,On both borders of the foot, it joined the dorsal outline. Next, the tourniquet cuff was then elevated to 250 mmHg after exsanguination of the extremity with the help of Esmarch. First, the skin on the dorsal  aspect was cut and the flap was fashioned. The extensor tendons were exposed. Any large veins encountered were clipped and ligated. The dorsal nerves were isolated as best as possible. However, they seemed to be very thin and atrophic looking. These were put under stretch and sectioned so that they retracted proximally. The dorsal flap was fashioned out after cutting the extensor tendons. The dissection was carried out to the middle of the metatarsal level. The first metatarsal was small and deformed. Next, the dissection was carried out onto the plantar aspect, and the plantar flap was similarly fashioned and here also the flexor tendon was sectioned and allowed to retract proximally. Here also the plantar nerve and vessels were identified, and the plantar vessels clipped and ligated while the nerves were put under stretch and sectioned. Next, the bony cuts were made using sagittal saw, and these were made approximately at the mid level of the metatarsals and in a cascade fashion, transecting all of the  2-5 metatarsal bones. The 1st metatarsal has been already resected from previous hallux amputation.  Next, the amputated forefoot was then removed. Next, the cut ends of the bone were rasped smooth. Following this, the wound was thoroughly irrigated and then swabbed for culture. Next, the tourniquet was released, and bleeding points controlled with electrocautery. After full hemostasis had been obtained, the wound was again thoroughly irrigated and then the dorsal plantar flap was further tailored so that the plantar flap was lying in a dorsal direction. The subcutaneous tissue was closed with interrupted sutures using #2-0 Vicryl and then finally the skin with interrupted sutures using a combination of #3-0 prolene, 0-prolene  and staples.   The partial foot was then assessed there was some slight equinus noted to have been the right leg was gently rotated to review the posterior heel where using a 15 blade a  percutaneous triple hemisection tendon lengthening was performed.  The foot was then dorsiflexed and 90 degrees of flexion was achieved and then the small stab wounds were irrigated and sutured closed with 3-0 prolene.  A Xeroform and a bulky foot dressing were applied, and the patient awakened and transferred to the recovery room in a stable condition.  DISPO: Patient to return to medical floor. Continue IV Abx to await intraoperative cultures for guidance on antibiotic regimen hopefully patient will be able to go home with oral antibiotics for soft tissue coverage for 2 weeks.  Also recommend follow-up ABIs to see if there is any need for vascular consult while inpatient.  Podiatry to follow.  Landis Martins, DPM

## 2021-12-04 NOTE — Brief Op Note (Signed)
12/04/2021  1:42 PM  PATIENT:  Eugene Diaz  56 y.o. male  PRE-OPERATIVE DIAGNOSIS:  Gangrene  POST-OPERATIVE DIAGNOSIS:  Gangrene  PROCEDURE:  Procedure(s): TRANSMETATARSAL AMPUTATION (Right) TENDON LENGTHENING (Right)  SURGEON:  Surgeon(s) and Role:    * Courteney Alderete, DPM - Primary  PHYSICIAN ASSISTANT:   ASSISTANTS: none   ANESTHESIA:   MAC  EBL:  50 ml   BLOOD ADMINISTERED:none  DRAINS: none   LOCAL MEDICATIONS USED:  MARCAINE    and BUPIVICAINE   SPECIMEN:  Source of Specimen:  Right partial foot to pathology and deep wound culture from amputation stump to micro  DISPOSITION OF SPECIMEN:  PATHOLOGY  COUNTS:  YES  TOURNIQUET:   Total Tourniquet Time Documented: Calf (Right) - 51 minutes Total: Calf (Right) - 51 minutes   DICTATION: .Note written in EPIC  PLAN OF CARE:  Transfer back to medical floor   PATIENT DISPOSITION:  PACU - hemodynamically stable.   Delay start of Pharmacological VTE agent (>24hrs) due to surgical blood loss or risk of bleeding: no

## 2021-12-04 NOTE — ED Notes (Signed)
Called floor for purple man/report.  Will notify this nurse back in 5 minutes.

## 2021-12-04 NOTE — Anesthesia Preprocedure Evaluation (Addendum)
Anesthesia Evaluation  Patient identified by MRN, date of birth, ID band Patient awake    Reviewed: Allergy & Precautions, NPO status , Patient's Chart, lab work & pertinent test results  Airway Mallampati: II  TM Distance: >3 FB Neck ROM: Full    Dental  (+) Poor Dentition, Missing, Loose, Chipped   Pulmonary asthma , former smoker,    breath sounds clear to auscultation       Cardiovascular hypertension, + Peripheral Vascular Disease   Rhythm:Regular Rate:Normal     Neuro/Psych PSYCHIATRIC DISORDERS Anxiety Depression  Neuromuscular disease    GI/Hepatic Neg liver ROS, PUD, GERD  ,  Endo/Other  diabetes  Renal/GU negative Renal ROS     Musculoskeletal  (+) Arthritis ,   Abdominal Normal abdominal exam  (+)   Peds  Hematology   Anesthesia Other Findings   Reproductive/Obstetrics                            Anesthesia Physical Anesthesia Plan  ASA: 3 and emergent  Anesthesia Plan: MAC   Post-op Pain Management:    Induction: Intravenous  PONV Risk Score and Plan: Propofol infusion and Ondansetron  Airway Management Planned:   Additional Equipment: None  Intra-op Plan:   Post-operative Plan:   Informed Consent: I have reviewed the patients History and Physical, chart, labs and discussed the procedure including the risks, benefits and alternatives for the proposed anesthesia with the patient or authorized representative who has indicated his/her understanding and acceptance.       Plan Discussed with: CRNA  Anesthesia Plan Comments:        Anesthesia Quick Evaluation

## 2021-12-04 NOTE — Transfer of Care (Signed)
Immediate Anesthesia Transfer of Care Note  Patient: Eugene Diaz  Procedure(s) Performed: TRANSMETATARSAL AMPUTATION (Right: Toe) TENDON LENGTHENING (Right)  Patient Location: PACU  Anesthesia Type:MAC  Level of Consciousness: awake, alert  and oriented  Airway & Oxygen Therapy: Patient Spontanous Breathing  Post-op Assessment: Report given to RN and Post -op Vital signs reviewed and stable  Post vital signs: Reviewed and stable  Last Vitals:  Vitals Value Taken Time  BP    Temp    Pulse 65 12/04/21 1340  Resp 9 12/04/21 1340  SpO2 100 % 12/04/21 1340  Vitals shown include unvalidated device data.  Last Pain:  Vitals:   12/04/21 1159  TempSrc: Oral  PainSc:          Complications: No notable events documented.

## 2021-12-04 NOTE — Consult Note (Addendum)
Podiatry Consult Note  To: Dr. Campbell Stall Reason for consult: Gangrene From: Dr. Landis Martins  HPI: Eugene Diaz is a 56 y.o. male patient who seen at bedside for evaluation of right foot pain. Patient complains of progressive changes to the right third toe was seen yesterday in office by Dr. [name] and was sent to the emergency room for antibiotics, MRI, vascular work-up, and surgery.  Per patient report she states that this has been going on for a really long time and was noted to come on Monday but the hospital did not have room to admit him at the time.  Patient denies any other pedal complaints.  Patient Active Problem List   Diagnosis Date Noted   Gangrene right third toe associated with type 2 diabetes mellitus ([place]), likely osteomyelitis 12/03/2021   Type 2 diabetes mellitus ([place]) 12/03/2021   Osteomyelitis of right foot (HCC)    Gangrene ([place])    Osteomyelitis of great toe of right foot ([place]) 08/18/2021   Severe depression ([place]) 06/09/2021   Suicidal ideation 06/09/2021   Diabetic wet gangrene of the foot ([place])    Unilateral complete BKA, left, initial encounter ([place]) 06/30/2020   Acute blood loss anemia 01/04/2020   Asthma    Depression with anxiety    Hyperglycemia 12/17/2019   Diabetic foot ulcer (HCC)    Anemia, chronic disease 11/10/2019   Marijuana use 11/10/2019   Wound dehiscence 08/31/2019   Hyponatremia 08/14/2019   Normocytic anemia 08/14/2019   B12 deficiency 08/14/2019   Subacute osteomyelitis, left ankle and foot (HCC)    GERD (gastroesophageal reflux disease) 12/19/2018   Hepatic steatosis 06/18/2018   Gastritis, erosive    Dysphagia, idiopathic 12/27/2017   Rectal bleeding 12/27/2017   Intention tremor 06/03/2016   Left knee pain 36/14/4315   Alcoholic peripheral neuropathy ([place]) 10/08/2014   Diabetic neuropathy ([place]) 10/08/2014   Chest pain, rule out acute myocardial infarction 03/10/2014   Thrombocytopenia ([place]) 03/10/2014   ETOH abuse  03/10/2014   Diabetes mellitus without complication ([place])    Hypertension associated with type 2 diabetes mellitus ([place])    Hyperlipidemia associated with type 2 diabetes mellitus ([place])     No current facility-administered medications on file prior to encounter.   Current Outpatient Medications on File Prior to Encounter  Medication Sig Dispense Refill   ALPRAZolam (XANAX) 1 MG tablet Take 0.5 tablets (0.5 mg total) by mouth 4 (four) times daily as needed for anxiety. 12 tablet 0   diclofenac Sodium (VOLTAREN) 1 % GEL Apply 2 g topically daily as needed (pain).     folic acid (FOLVITE) 1 MG tablet Take 1 tablet (1 mg total) by mouth daily. 30 tablet 0   lidocaine (XYLOCAINE) 5 % ointment Apply 1 application topically 2 (two) times daily as needed for mild pain.     lisinopril (ZESTRIL) 40 MG tablet Take 1 tablet (40 mg total) by mouth at bedtime. (Patient taking differently: Take 40 mg by mouth daily.) 30 tablet 0   metFORMIN (GLUCOPHAGE) 500 MG tablet Take 1 tablet (500 mg total) by mouth 2 (two) times daily with a meal. 180 tablet 1   morphine (MS CONTIN) 30 MG 12 hr tablet Take 30 mg by mouth 2 (two) times daily as needed for pain.     oxycodone (ROXICODONE) 30 MG immediate release tablet Take 30 mg by mouth 4 (four) times daily as needed for pain.     silver sulfADIAZINE (SILVADENE) 1 % cream Apply pea-sized amount to  wound daily. (Patient taking differently: 1 application daily. Apply pea-sized amount to wound daily.) 50 g 0   acetaminophen (TYLENOL) 325 MG tablet Take 2 tablets (650 mg total) by mouth every 6 (six) hours as needed for mild pain (or Fever >/= 101). (Patient not taking: Reported on 07/23/2021) 20 tablet 0   amLODipine (NORVASC) 10 MG tablet Take 1 tablet (10 mg total) by mouth daily. (Patient not taking: Reported on 06/09/2021) 90 tablet 1   amoxicillin-clavulanate (AUGMENTIN) 875-125 MG tablet Take 1 tablet by mouth every 12 (twelve) hours. (Patient not taking: Reported on  12/03/2021) 14 tablet 0   aspirin EC 81 MG EC tablet Take 1 tablet (81 mg total) by mouth daily. Swallow whole. (Patient not taking: Reported on 05/05/2021) 30 tablet 11   atorvastatin (LIPITOR) 40 MG tablet Take 40 mg by mouth every evening.  (Patient not taking: Reported on 03/27/2021)  10   ferrous sulfate 325 (65 FE) MG tablet Take 1 tablet (325 mg total) by mouth daily. (Patient not taking: Reported on 03/27/2021) 30 tablet 1   gabapentin (NEURONTIN) 300 MG capsule TAKE 3 CAPSULES BY MOUTH IN THE MORNING, 3 CAPSULES AT NOON, AND 4 CAPSULES AT BEDTIME FOR NERVE PAIN. (Patient not taking: Reported on 12/03/2021) 300 capsule 1   glipiZIDE (GLUCOTROL) 5 MG tablet Take 1 tablet (5 mg total) by mouth 2 (two) times daily. (Patient not taking: Reported on 05/05/2021) 180 tablet 1   hydrALAZINE (APRESOLINE) 50 MG tablet Take 1 tablet (50 mg total) by mouth every 8 (eight) hours. (Patient not taking: Reported on 03/27/2021) 90 tablet 0    No Known Allergies  Past Surgical History:  Procedure Laterality Date   AMPUTATION Left 10/16/2019   Procedure: Left partial second ray resection; placement of antibiotic beads;  Surgeon: Evelina Bucy, DPM;  [facility]: [facility];  Service: Podiatry;  Laterality: Left;   AMPUTATION Left 11/13/2019   Procedure: Left Midfoot Amputation - Transmetatarsal vs. Lisfranc; Placement antibiotic beads;  Surgeon: Evelina Bucy, DPM;  Location[facility];  Service: Podiatry;  Laterality: Left;   AMPUTATION Left 12/21/2019   Procedure: [place] Amputation left foot;  Surgeon: Evelina Bucy, DPM;  Location: [place];  Service: Podiatry;  Laterality: Left;   AMPUTATION Left 06/24/2020   Procedure: LEFT BELOW KNEE AMPUTATION;  Surgeon: Newt Minion, MD;  Location: [place];  Service: Orthopedics;  Laterality: Left;   AMPUTATION Right 03/29/2021   Procedure: AMPUTATION RAY;  Surgeon: Evelina Bucy, DPM;  Location: [place];  Service: Podiatry;  Laterality: Right;   AMPUTATION TOE Left 08/14/2019    Procedure: AMPUTATION GREAT TOE;  Surgeon: Evelina Bucy, DPM;  [facility];  Service: Podiatry;  Laterality: Left;   APPLICATION OF WOUND VAC Left 12/21/2019   Procedure: Application Of Wound Vac;  Surgeon: Evelina Bucy, DPM;  Location: [place];  Service: Podiatry;  Laterality: Left;   BIOPSY  02/13/2018   Procedure: BIOPSY;  Surgeon: Danie Binder, MD;  [facility];  Service: Endoscopy;;  transverse colon biopsy, gastric biopsy   COLONOSCOPY WITH PROPOFOL N/A 02/13/2018   Procedure: COLONOSCOPY WITH PROPOFOL;  Surgeon: Danie Binder, MD;  [facility];  Service: Endoscopy;  Laterality: N/A;  11:15am   ESOPHAGOGASTRODUODENOSCOPY (EGD) WITH PROPOFOL N/A 02/13/2018   Procedure: ESOPHAGOGASTRODUODENOSCOPY (EGD) WITH PROPOFOL;  Surgeon: Danie Binder, MD;  [facility];  Service: Endoscopy;  Laterality: N/A;   I & D EXTREMITY Left 07/16/2019  Procedure: Insicion  AND DEBRIDEMENT EXTREMITY;  Surgeon: Evelina Bucy, DPM;  Location: [place];  Service: Podiatry;  Laterality: Left;   I & D EXTREMITY Left 06/22/2020   Procedure: IRRIGATION AND DEBRIDEMENT EXTREMITY, left foot and ankle;  Surgeon: Evelina Bucy, DPM;  Location: [place];  Service: Podiatry;  Laterality: Left;   I & D EXTREMITY Right 03/29/2021   Procedure: IRRIGATION AND DEBRIDEMENT EXTREMITY;  Surgeon: Evelina Bucy, DPM;  Location: [place];  Service: Podiatry;  Laterality: Right;   INCISION AND DRAINAGE OF WOUND Left 10/13/2019   Procedure: IRRIGATION AND DEBRIDEMENT WOUND OF LEFT FOOT AND FIRST METATARSAL RESECTION;  Surgeon: Evelina Bucy, DPM;  [facility];  Service: Podiatry;  Laterality: Left;   IRRIGATION AND DEBRIDEMENT FOOT Left 11/11/2019   Procedure: Left Foot Wound Irrigation and Debridement;  Surgeon: Evelina Bucy, DPM;  [facility];  Service: Podiatry;  Laterality: Left;   Left arm     Left arm repair (tendon and artery)   PERCUTANEOUS PINNING  07/16/2019    Procedure: Open Reduction Percutaneous Pinning Extremity;  Surgeon: Evelina Bucy, DPM;  Location: [place];  Service: Podiatry;;   PILONIDAL CYST EXCISION N/A 08/08/2014   Procedure: CYST EXCISION PILONIDAL EXTENSIVE;  Surgeon: Jamesetta So, MD;  [facility];  Service: General;  Laterality: N/A;   POLYPECTOMY  02/13/2018   Procedure: POLYPECTOMY;  Surgeon: Danie Binder, MD;  [facility];  Service: Endoscopy;;  transverse colon polyp hs, rectal polyps times 2   SAVORY DILATION N/A 02/13/2018   Procedure: SAVORY DILATION;  Surgeon: Danie Binder, MD;  [facility];  Service: Endoscopy;  Laterality: N/A;   WOUND DEBRIDEMENT Left 09/04/2019   Procedure: DEBRIDEMENT WOUND WITH COMPLEX  REPAIR OF DEHISCENCE;  Surgeon: Evelina Bucy, DPM;  Location: [place];  Service: Podiatry;  Laterality: Left;  Leave patient on stretcher   WOUND DEBRIDEMENT Left 10/16/2019   Procedure: Left foot wound debridement and closure;  Surgeon: Evelina Bucy, DPM;  [facility];  Service: Podiatry;  Laterality: Left;   WOUND DEBRIDEMENT Left 12/18/2019   Procedure: LEFT FOOT DEBRIDEMENT WITH PARTIAL INCISION OF INFECTED BONE;  Surgeon: Evelina Bucy, DPM;  Location: [place];  Service: Podiatry;  Laterality: Left;  LEFT FOOT DEBRIDEMENT WITH PARTIAL INCISION OF INFECTED BONE   WOUND DEBRIDEMENT Right 04/01/2021   Procedure: Right foot wound debridement and irrigation and closure, bone biopsy;  Surgeon: Evelina Bucy, DPM;  Location: [place];  Service: Podiatry;  Laterality: Right;    Family History  Problem Relation Age of Onset   Heart attack Mother        Living, 53   Cancer Father        Deceased   Healthy Brother    Healthy Sister    Colon cancer Neg Hx    Colon polyps Neg Hx     Social History   Socioeconomic History   Marital status: Widowed    Spouse name: Not on file   Number of children: 0   Years of education: Not on file   Highest education  level: 8th grade  Occupational History   Occupation: Manufact[employer]    Comment: Assembling RV's and Horse Trailers.  Tobacco Use   Smoking status: Former    Packs/day: 2.50    Years: 25.00    Pack years: 62.50    Types: Cigarettes    Quit date: 08/05/1984  Years since quitting: 37.3   Smokeless tobacco: Never  Vaping Use   Vaping Use: Never used  Substance and Sexual Activity   Alcohol use: Not Currently    Alcohol/week: 50.0 standard drinks    Types: 50 Standard drinks or equivalent per week    Comment: Drinks 6 beers, liquor 4-6oz (both daily) x 35 years   Drug use: Not Currently    Types: Marijuana   Sexual activity: Yes    Birth control/protection: None  Other Topics Concern   Not on file  Social History Narrative   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 Highest level of education:  8th grade   He lives with wife.  No children.   Left handed   Drinks soda, water-no coffee, no tea   Social Determinants of Health   Financial Resource Strain: Not on file  Food Insecurity: Not on file  Transportation Needs: Not on file  Physical Activity: Not on file  Stress: Not on file  Social Connections: Not on file  Intimate Partner Violence: Not on file     Objective:  Today's Vitals   12/04/21 0545 12/04/21 0615 12/04/21 0630 12/04/21 0828  BP: (!) 120/58 139/82 (!) 144/71 (!) 148/93  Pulse: (!) 58 60 (!) 58 (!) 58  Resp: 17  17 15   Temp:      TempSrc:      SpO2: 97% 99% 99% 100%  Weight:      Height:      PainSc:       Body mass index is 35.95 kg/m.   General: Alert and oriented x3 in no acute distress  Dermatology: There is dry gangrene to the distal tuft of the right third toe.  Patient surgical scar well-healed at previous hallux amputation on the right.  Vascular: Dorsalis pedis and posterior tibial pedal pulse difficult to palpate.  Neurology: Protective sensation absent on the right.  Musculoskeletal: BKA left,  status post prior right hallux amputation.  Results Xrays  Right foot IMPRESSION: 1. New soft tissue ulceration of the tip of the third toe with underlying bony destruction of the third distal phalanx, consistent with osteomyelitis.  MRI IMPRESSION: Abnormal edema and enhancement of the phalangeal of the third toe consistent with osteomyelitis. No abnormal signal seen affecting the third toe metatarsal. See above for full discussion.     Results for orders placed or performed during the hospital encounter of 12/03/21  Resp Panel by RT-PCR (Flu A&B, Covid) Nasopharyngeal Swab     Status: None   Collection Time: 12/03/21  6:25 PM   Specimen: Nasopharyngeal Swab; Nasopharyngeal(NP) swabs in vial transport medium  Result Value Ref Range Status   SARS Coronavirus 2 by RT PCR NEGATIVE NEGATIVE Final    Comment: (NOTE) SARS-CoV-2 target nucleic acids are NOT DETECTED.  The SARS-CoV-2 RNA is generally detectable in upper respiratory specimens during the acute phase of infection. The lowest concentration of SARS-CoV-2 viral copies this assay can detect is 138 copies/mL. A negative result does not preclude SARS-Cov-2 infection and should not be used as the sole basis for treatment or other patient management decisions. A negative result may occur with  improper specimen collection/handling, submission of specimen other than nasopharyngeal swab, presence of viral mutation(s) within the areas targeted by this assay, and inadequate number of viral copies(<138 copies/mL). A negative result must be combined with clinical observations, patient history, and epidemiological information. The expected result is Negative.  Fact Sheet for Patients:  EntrepreneurPulse.com.au  Fact Sheet for Healthcare Providers:  IncredibleEmployment.be  This test is no t yet approved or cleared by the [place] FDA and  has been authorized for detection and/or diagnosis  of SARS-CoV-2 by FDA under an Emergency Use Authorization (EUA). This EUA will remain  in effect (meaning this test can be used) for the duration of the COVID-19 declaration under Section 564(b)(1) of the Act, 21 U.S.C.section 360bbb-3(b)(1), unless the authorization is terminated  or revoked sooner.       Influenza A by PCR NEGATIVE NEGATIVE Final   Influenza B by PCR NEGATIVE NEGATIVE Final    Comment: (NOTE) The Xpert Xpress SARS-CoV-2/FLU/RSV plus assay is intended as an aid in the diagnosis of influenza from Nasopharyngeal swab specimens and should not be used as a sole basis for treatment. Nasal washings and aspirates are unacceptable for Xpert Xpress SARS-CoV-2/FLU/RSV testing.  Fact Sheet for Patients: EntrepreneurPulse.com.au  Fact Sheet for Healthcare Providers: IncredibleEmployment.be  This test is not yet approved or cleared by the [place] FDA and has been authorized for detection and/or diagnosis of SARS-CoV-2 by FDA under an Emergency Use Authorization (EUA). This EUA will remain in effect (meaning this test can be used) for the duration of the COVID-19 declaration under Section 564(b)(1) of the Act, 21 U.S.C. section 360bbb-3(b)(1), unless the authorization is terminated or revoked.  Performed at [facility], [place] [address] [place], [place] [address]      Assessment and Plan: Problem List Items Addressed This Visit       Musculoskeletal and Integument   Osteomyelitis of right foot ([place]) - Primary   Relevant Medications   vancomycin (VANCOREADY) IVPB 1500 mg/300 mL   ceFEPIme (MAXIPIME) 2 g in sodium chloride 0.9 % 100 mL IVPB     Other   Gangrene (HCC)     -Complete examination performed -Xrays reviewed -Discussed treatment options for gangrene and osteomyelitis -Patient agreeable for surgical management.  Consent to be obtained for transmetatarsal amputation right foot with possible Achilles  lengthening. Pre and Post op course explained. Risks, benefits, alternatives explained. No guarantees given or implied.  -Patient is n.p.o. for OR today -Recommend rest and elevation for pain and edema control -Recommend continue with medical management and IV antibiotics -Awaiting ABIs; to follow up this result postsurgery and if there is acute concerns patient will need vascular evaluation -Patient to be weightbearing for transfers only -Consult appreciated -Podiatry to follow   Dr. Landis Martins, [place] [facility] [place] [phone] office [phone] cell Available via secure chat  Time spent with patient for exam and coordination of care: 36 mins

## 2021-12-04 NOTE — Progress Notes (Signed)
Orthopedic Tech Progress Note Patient Details:  Eugene Diaz 11-21-1965 [id number]   Ortho Devices Type of Ortho Device: CAM walker Ortho Device/Splint Location: at bedside, for RLE Ortho Device/Splint Interventions: Ordered   Post Interventions Instructions Provided: Adjustment of device, Care of device  Vikram Tillett [name] 12/04/2021, 5:59 PM

## 2021-12-05 ENCOUNTER — Encounter (HOSPITAL_COMMUNITY): Payer: Self-pay | Admitting: Sports Medicine

## 2021-12-05 DIAGNOSIS — E1152 Type 2 diabetes mellitus with diabetic peripheral angiopathy with gangrene: Secondary | ICD-10-CM | POA: Diagnosis not present

## 2021-12-05 DIAGNOSIS — R45851 Suicidal ideations: Secondary | ICD-10-CM

## 2021-12-05 LAB — BASIC METABOLIC PANEL
Anion gap: 7 (ref 5–15)
BUN: 13 mg/dL (ref 6–20)
CO2: 21 mmol/L — ABNORMAL LOW (ref 22–32)
Calcium: 9 mg/dL (ref 8.9–10.3)
Chloride: 108 mmol/L (ref 98–111)
Creatinine, Ser: 0.89 mg/dL (ref 0.61–1.24)
GFR, Estimated: >60 mL/min (ref >60)
Glucose, Bld: 115 mg/dL — ABNORMAL HIGH (ref 70–99)
Potassium: 4.4 mmol/L (ref 3.5–5.1)
Sodium: 136 mmol/L (ref 135–145)

## 2021-12-05 LAB — GLUCOSE, CAPILLARY
Glucose-Capillary: 106 mg/dL — ABNORMAL HIGH (ref 70–99)
Glucose-Capillary: 132 mg/dL — ABNORMAL HIGH (ref 70–99)
Glucose-Capillary: 109 mg/dL — ABNORMAL HIGH (ref 70–99)
Glucose-Capillary: 140 mg/dL — ABNORMAL HIGH (ref 70–99)

## 2021-12-05 LAB — CBC
HCT: 29.2 % — ABNORMAL LOW (ref 39.0–52.0)
Hemoglobin: 9.6 g/dL — ABNORMAL LOW (ref 13.0–17.0)
MCH: 29.1 pg (ref 26.0–34.0)
MCHC: 32.9 g/dL (ref 30.0–36.0)
MCV: 88.5 fL (ref 80.0–100.0)
Platelets: 289 10*3/uL (ref 150–400)
RBC: 3.3 MIL/uL — ABNORMAL LOW (ref 4.22–5.81)
RDW: 13.9 % (ref 11.5–15.5)
WBC: 8.7 10*3/uL (ref 4.0–10.5)
nRBC: 0 % (ref 0.0–0.2)

## 2021-12-05 MED ORDER — MIRTAZAPINE 15 MG PO TABS
7.5000 mg | ORAL_TABLET | Freq: Every day | ORAL | Status: DC
  Administered 2021-12-05 – 2021-12-06 (×2): 7.5 mg via ORAL
  Filled 2021-12-05 (×2): qty 1

## 2021-12-05 NOTE — Progress Notes (Signed)
PROGRESS NOTE    Eugene Diaz  HWT:[id number] DOB: [birth year] DOA: 12/03/2021 PCP: Ginger Organ   Brief Narrative:  56 year old male with past medical history of diabetes, previous foot/toe amputations including left BKA, right first toe.  Presents with gangrenous right third toe.  Reports has been painful for some time, "looking pretty bad" and he went to podiatry outpatient today for wound check, after having been treated in the ED last week and noting worsening of the toe.  Podiatry advised he go to the ED, likely would need admission for IV antibiotics and possible surgical amputation of the third digit.  MRI, ABIs ordered.    ED course: Mild hypertension up to 917 systolic, 97 diastolic, patient not on home medications.  White cell count within normal limits, mild low hemoglobin of 11.8, mild low sodium 134, mild low potassium 3.4, glucose controlled at 152, blood cultures obtained, x-ray right foot shows concerning for osteomyelitis.  MRI right foot shows osteomyelitis of right third toe.  Podiatrist consulted.  Patient started on IV cefepime and vancomycin.  [facility] hospital[facility] consulted for admission for further evaluation and management.  Assessment & Plan:  Gangrene right third toe associated with type 2 diabetes mellitus  Osteomyelitis: -S/p transmetatarsal amputation by podiatry on 12/04/2021.  POD 1 -Continue IV cefepime and vancomycin.  -ABIs still pending-May need vascular surgery evaluation based on [name] results. -Continue as needed pain medications -Awaiting Intra-Op cultures.  Appreciate podiatry help -PT recommended home health PT -Seen by podiatrist recommended likely home tomorrow on p.o. antibiotics for 2 weeks.   Hypertension associated with type 2 diabetes mellitus ([name]) -Stable.  Continue amlodipine 10 mg daily, lisinopril 40 mg daily, hydralazine 10 mg 3 times daily -Monitor blood pressure closely   Hyperlipidemia associated with type 2 diabetes mellitus  ([name]) -Continue atorvastatin   Type 2 diabetes mellitus ([name]) -Well-controlled.  A1c 5.8%.  Continue sliding scale insulin and monitor blood sugar closely   Anemia, chronic disease -Drop in hemoglobin from 10.3-9.6 likely secondary to recent surgery. -No indication of transfusion at this time.  Continue to monitor.  Hypokalemia: Resolved  Major depression: -Evaluated by psychiatry.  No active suicidal ideation.  Patient is depressed since his wife passed away 3 years ago. -Psychiatry recommended Remeron nightly and chaplain consult-appreciate psychiatry help. -Needs close follow-up with psychiatry outpatient  DVT prophylaxis: SCD Code Status: Full code Family Communication:  None present at bedside.  Plan of care discussed with patient in length and he verbalized understanding and agreed with it. Disposition Plan: Home with home health services  Consultants:  Podiatrist  Procedures:  Transmetatarsal amputation  Antimicrobials:  Cefepime Vancomycin  Status is: Inpatient    Subjective: Patient seen and examined.  Sitting comfortably on the bed.  Psychiatrist at the bedside.  Patient reports that he could not sleep last night.  He feels sad.  Denies any severe pain.  No fever overnight.    Objective: Vitals:   12/04/21 2128 12/05/21 0513 12/05/21 0747 12/05/21 1354  BP: 121/88 126/63 (!) 143/70 132/76  Pulse: 71 69 69 85  Resp:  16 18 18   Temp: 98.4 F (36.9 C) 98.7 F (37.1 C) 98.4 F (36.9 C) 98.7 F (37.1 C)  TempSrc: Oral Oral Oral Oral  SpO2: 98% 99% 99% 99%  Weight:      Height:        Intake/Output Summary (Last 24 hours) at 12/05/2021 1418 Last data filed at 12/05/2021 0748 Gross per 24 hour  Intake --  Output 550 ml  Net -550 ml    Filed Weights   12/03/21 1547 12/04/21 1204  Weight: 127 kg 127 kg    Examination:  General exam: Appears calm and comfortable, on room air, communicating well Respiratory system: Clear to auscultation. Respiratory  effort normal. Cardiovascular system: S1 & S2 heard, RRR. No JVD, murmurs, rubs, gallops or clicks. No pedal edema. Gastrointestinal system: Abdomen is nondistended, soft and nontender. No organomegaly or masses felt. Normal bowel sounds heard. Central nervous system: Alert and oriented. No focal neurological deficits. Extremities: Left BKA   Psychiatry: Judgement and insight appear normal. Mood & affect appropriate.    Data Reviewed: I have personally reviewed following labs and imaging studies  CBC: Recent Labs  Lab 11/29/21 1049 12/03/21 1558 12/04/21 0301 12/05/21 0311  WBC 6.9 9.2 9.4 8.7  NEUTROABS 4.3 5.1  --   --   HGB 10.3* 11.8* 10.3* 9.6*  HCT 30.6* 35.3* 30.7* 29.2*  MCV 87.9 87.8 88.7 88.5  PLT 353 427* 352 416    Basic Metabolic Panel: Recent Labs  Lab 11/29/21 1049 12/03/21 1558 12/04/21 0301 12/04/21 1520 12/05/21 0311  NA 135 134* 135  --  136  K 3.9 3.4* 3.4*  --  4.4  CL 100 106 105  --  108  CO2 27 22 21*  --  21*  GLUCOSE 141* 152* 121*  --  115*  BUN 13 10 9   --  13  CREATININE 0.91 0.93 0.83  --  0.89  CALCIUM 9.4 9.1 8.7*  --  9.0  MG  --   --   --  1.7  --     GFR: Estimated Creatinine Clearance: 131.2 mL/min (by C-G formula based on SCr of 0.89 mg/dL). Liver Function Tests: Recent Labs  Lab 11/29/21 1049 12/03/21 1558  AST 14* 29  ALT 9 18  ALKPHOS 65 78  BILITOT 0.6 0.4  PROT 8.3* 8.8*  ALBUMIN 3.0* 3.5    No results for input(s): LIPASE, AMYLASE in the last 168 hours. No results for input(s): AMMONIA in the last 168 hours. Coagulation Profile: No results for input(s): INR, PROTIME in the last 168 hours. Cardiac Enzymes: No results for input(s): CKTOTAL, CKMB, CKMBINDEX, TROPONINI in the last 168 hours. BNP (last 3 results) No results for input(s): PROBNP in the last 8760 hours. HbA1C: Recent Labs    12/04/21 0301  HGBA1C 5.8*    CBG: Recent Labs  Lab 12/04/21 1347 12/04/21 1651 12/04/21 2128 12/05/21 0742  12/05/21 1238  GLUCAP 104* 130* 139* 106* 132*    Lipid Profile: No results for input(s): CHOL, HDL, LDLCALC, TRIG, CHOLHDL, LDLDIRECT in the last 72 hours. Thyroid Function Tests: No results for input(s): TSH, T4TOTAL, FREET4, T3FREE, THYROIDAB in the last 72 hours. Anemia Panel: No results for input(s): VITAMINB12, FOLATE, FERRITIN, TIBC, IRON, RETICCTPCT in the last 72 hours. Sepsis Labs: Recent Labs  Lab 11/29/21 1049 12/03/21 1558 12/03/21 1733  LATICACIDVEN 0.8 1.3 1.0     Recent Results (from the past 240 hour(s))  Blood Cultures x 2 sites     Status: None   Collection Time: 11/29/21 11:24 AM   Specimen: BLOOD LEFT HAND  Result Value Ref Range Status   Specimen Description BLOOD LEFT HAND  Final   Special Requests   Final    BOTTLES DRAWN AEROBIC AND ANAEROBIC Blood Culture adequate volume   Culture   Final    NO GROWTH 5 DAYS Performed at [facility], [address] [place], [place] [address]    Report Status 12/04/2021 FINAL  Final  Culture, blood (routine x 2)     Status: None (Preliminary result)   Collection Time: 12/03/21  3:58 PM   Specimen: BLOOD  Result Value Ref Range Status   Specimen Description BLOOD SITE NOT SPECIFIED  Final   Special Requests   Final    BOTTLES DRAWN AEROBIC AND ANAEROBIC Blood Culture adequate volume   Culture   Final    NO GROWTH 2 DAYS Performed at [facility], [address] [place], [place] [address]    Report Status PENDING  Incomplete  Culture, blood (routine x 2)     Status: None (Preliminary result)   Collection Time: 12/03/21  5:25 PM   Specimen: BLOOD  Result Value Ref Range Status   Specimen Description BLOOD RIGHT ANTECUBITAL  Final   Special Requests   Final    BOTTLES DRAWN AEROBIC AND ANAEROBIC Blood Culture results may not be optimal due to an inadequate volume of blood received in culture bottles   Culture   Final    NO GROWTH 2 DAYS Performed at [facility], [place] [address]  [place], [place] [address]    Report Status PENDING  Incomplete  Resp Panel by RT-PCR (Flu A&B, Covid) Nasopharyngeal Swab     Status: None   Collection Time: 12/03/21  6:25 PM   Specimen: Nasopharyngeal Swab; Nasopharyngeal(NP) swabs in vial transport medium  Result Value Ref Range Status   SARS Coronavirus 2 by RT PCR NEGATIVE NEGATIVE Final    Comment: (NOTE) SARS-CoV-2 target nucleic acids are NOT DETECTED.  The SARS-CoV-2 RNA is generally detectable in upper respiratory specimens during the acute phase of infection. The lowest concentration of SARS-CoV-2 viral copies this assay can detect is 138 copies/mL. A negative result does not preclude SARS-Cov-2 infection and should not be used as the sole basis for treatment or other patient management decisions. A negative result may occur with  improper specimen collection/handling, submission of specimen other than nasopharyngeal swab, presence of viral mutation(s) within the areas targeted by this assay, and inadequate number of viral copies(<138 copies/mL). A negative result must be combined with clinical observations, patient history, and epidemiological information. The expected result is Negative.  Fact Sheet for Patients:  EntrepreneurPulse.com.au  Fact Sheet for Healthcare Providers:  IncredibleEmployment.be  This test is no t yet approved or cleared by the [place] FDA and  has been authorized for detection and/or diagnosis of SARS-CoV-2 by FDA under an Emergency Use Authorization (EUA). This EUA will remain  in effect (meaning this test can be used) for the duration of the COVID-19 declaration under Section 564(b)(1) of the Act, 21 U.S.C.section 360bbb-3(b)(1), unless the authorization is terminated  or revoked sooner.       Influenza A by PCR NEGATIVE NEGATIVE Final   Influenza B by PCR NEGATIVE NEGATIVE Final    Comment: (NOTE) The Xpert Xpress SARS-CoV-2/FLU/RSV plus assay is  intended as an aid in the diagnosis of influenza from Nasopharyngeal swab specimens and should not be used as a sole basis for treatment. Nasal washings and aspirates are unacceptable for Xpert Xpress SARS-CoV-2/FLU/RSV testing.  Fact Sheet for Patients: EntrepreneurPulse.com.au  Fact Sheet for Healthcare Providers: IncredibleEmployment.be  This test is not yet approved or cleared by the [place] FDA and has been authorized for detection and/or diagnosis of SARS-CoV-2 by FDA under an Emergency Use Authorization (EUA). This EUA will remain in effect (  meaning this test can be used) for the duration of the COVID-19 declaration under Section 564(b)(1) of the Act, 21 U.S.C. section 360bbb-3(b)(1), unless the authorization is terminated or revoked.  Performed at [facility], [place] [address], [place], [place] [address]   Surgical PCR screen     Status: Abnormal   Collection Time: 12/04/21 11:33 AM   Specimen: Nasal Mucosa; Nasal Swab  Result Value Ref Range Status   MRSA, PCR POSITIVE (A) NEGATIVE Final    Comment: RESULT CALLED TO, READ BACK BY AND VERIFIED WITH: RN Ray Church [phone] AT 1448 BY CM    Staphylococcus aureus POSITIVE (A) NEGATIVE Final    Comment: (NOTE) The Xpert SA Assay (FDA approved for NASAL specimens in patients 84 years of age and older), is one component of a comprehensive surveillance program. It is not intended to diagnose infection nor to guide or monitor treatment. Performed at [facility], [place] [address], [place], [place] [address]   Aerobic/Anaerobic Culture w Gram Stain (surgical/deep wound)     Status: None (Preliminary result)   Collection Time: 12/04/21 12:54 PM   Specimen: PATH Other; Tissue  Result Value Ref Range Status   Specimen Description FOOT  Final   Special Requests RIGHT FOOT AMPUTATION STUMP SPEC A  Final   Gram Stain NO WBC SEEN NO ORGANISMS SEEN   Final   Culture   Final    NO  GROWTH < 24 HOURS Performed at [facility], [place] [address] [place], [place] [address]    Report Status PENDING  Incomplete       Radiology Studies: MR FOOT RIGHT W WO CONTRAST  Result Date: 12/03/2021 CLINICAL DATA:  Gangrene of the right third toe. EXAM: MRI OF THE RIGHT FOREFOOT WITHOUT AND WITH CONTRAST TECHNIQUE: Multiplanar, multisequence MR imaging of the right forefoot was performed before and after the administration of intravenous contrast. CONTRAST:  57mL GADAVIST GADOBUTROL 1 MMOL/ML IV SOLN COMPARISON:  Radiography same day FINDINGS: Study is degraded by patient motion. There has been distant amputation of the great toe at the proximal metatarsal level. No complicating feature seen in that location. Second toe appears negative for osteomyelitis. There are some degenerative changes at the metatarsal phalangeal joint. Third toe shows abnormal edema and enhancement of the phalangeal consistent with osteomyelitis. No abnormal signal seen affecting the metatarsal head or more proximal metatarsal. Fourth and fifth toes are negative. No evidence of soft tissue fluid collection. IMPRESSION: Abnormal edema and enhancement of the phalangeal of the third toe consistent with osteomyelitis. No abnormal signal seen affecting the third toe metatarsal. See above for full discussion. Electronically Signed   By: Nelson Chimes M.D.   On: 12/03/2021 21:59   DG Foot 2 Views Right  Result Date: 12/04/2021 CLINICAL DATA:  Postoperative right foot surgery for gangrene of the right third toe. EXAM: RIGHT FOOT - 2 VIEW COMPARISON:  12/03/2021 FINDINGS: Interval postoperative changes with amputation of the right forefoot at the proximal/mid metatarsal level. Bone resection margins appear intact. Skin clips and soft tissue gas are likely related to recent surgery. Degenerative changes are noted in the interphalangeal joints. Small calcaneal spurs. IMPRESSION: Interval postoperative amputation of the right  forefoot at the mid metatarsal level. Soft tissue gas is likely postoperative. Electronically Signed   By: Lucienne Capers M.D.   On: 12/04/2021 19:07   DG Foot Complete Right  Result Date: 12/03/2021 Please see detailed radiograph report in office note.   Scheduled  Meds:  amLODipine  10 mg Oral Daily   atorvastatin  40 mg Oral QPM   Chlorhexidine Gluconate Cloth  6 each Topical Daily   insulin aspart  0-15 Units Subcutaneous TID WC   insulin aspart  0-5 Units Subcutaneous QHS   lisinopril  40 mg Oral Daily   morphine  30 mg Oral Q12H   mupirocin ointment  1 application Nasal BID   sodium chloride flush  3 mL Intravenous Q12H   Continuous Infusions:  sodium chloride     ceFEPime (MAXIPIME) IV 2 g (12/05/21 1011)   vancomycin 1,500 mg (12/05/21 0548)     LOS: 2 days   Time spent: 35 minutes   Juli Odom Loann Quill, MD [facility]  If 7PM-7AM, please contact night-coverage [email or web address] 12/05/2021, 2:18 PM

## 2021-12-05 NOTE — Progress Notes (Signed)
Patient stated he thinks about hurting himself and he has "lots of guns". Patient stated he has been depressed since his wife passed away three (3) years ago. NP made aware. Order for psych consult made. Will continue to monitor patient.

## 2021-12-05 NOTE — Consult Note (Signed)
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 for at least 8 hours.   BUN 13 6 - 20 mg/dL   Creatinine, Ser 0.89 0.61 - 1.24 mg/dL   Calcium 9.0 8.9 - 10.3 mg/dL   GFR, Estimated >60 >60 mL/min    Comment: (NOTE) Calculated using the CKD-EPI Creatinine Equation (2021)    Anion gap 7 5 - 15    Comment: Performed at [facility] [address] [place], [place] [address]  Glucose, capillary     Status: Abnormal   Collection Time: 12/05/21  7:42 AM  Result Value Ref Range   Glucose-Capillary 106 (H) 70 - 99 mg/dL    Comment: Glucose reference range applies only to samples taken after  fasting for at least 8 hours.  Glucose, capillary     Status: Abnormal   Collection Time: 12/05/21 12:38 PM  Result Value Ref Range   Glucose-Capillary 132 (H) 70 - 99 mg/dL    Comment: Glucose reference range applies only to samples taken after fasting for at least 8 hours.    Current Facility-Administered Medications  Medication Dose Route Frequency Provider Last Rate Last Admin   0.9 %  sodium chloride infusion  250 mL Intravenous PRN Emeterio Reeve, DO       acetaminophen (TYLENOL) tablet 650 mg  650 mg Oral Q6H PRN Pahwani, Rinka R, MD   650 mg at 12/04/21 1710   ALPRAZolam (XANAX) tablet 0.5 mg  0.5 mg Oral QID PRN Emeterio Reeve, DO   0.5 mg at 12/04/21 1439   amLODipine (NORVASC) tablet 10 mg  10 mg Oral Daily Emeterio Reeve, DO   10 mg at 12/05/21 1013   atorvastatin (LIPITOR) tablet 40 mg  40 mg Oral QPM Emeterio Reeve, DO   40 mg at 12/04/21 1904   ceFEPIme (MAXIPIME) 2 g in sodium chloride 0.9 % 100 mL IVPB  2 g Intravenous Q8H Emeterio Reeve, DO 200 mL/hr at 12/05/21 1011 2 g at 12/05/21 1011   Chlorhexidine Gluconate Cloth 2 % PADS 6 each  6 each Topical Daily Darliss Cheney, MD   6 each at 12/05/21 1338   insulin aspart (novoLOG) injection 0-15 Units  0-15 Units Subcutaneous TID WC Emeterio Reeve, DO   2 Units at 12/05/21 1337   insulin aspart (novoLOG) injection 0-5 Units  0-5 Units Subcutaneous QHS Emeterio Reeve, DO       lisinopril (ZESTRIL) tablet 40 mg  40 mg Oral Daily Emeterio Reeve, DO   40 mg at 12/05/21 1013   mirtazapine (REMERON) tablet 7.5 mg  7.5 mg Oral QHS Ival Bible, MD       morphine (MS CONTIN) 12 hr tablet 30 mg  30 mg Oral Q12H Emeterio Reeve, DO   30 mg at 12/05/21 1013   morphine (PF) 2 MG/ML injection 2 mg  2 mg Intravenous Q2H PRN Emeterio Reeve, DO   2 mg at 12/05/21 1013   mupirocin ointment (BACTROBAN) 2 % 1 application  1 application Nasal BID Emeterio Reeve, DO   1 application at 15/17/61 1017    sodium chloride flush (NS) 0.9 % injection 3 mL  3 mL Intravenous Q12H Emeterio Reeve, DO   3 mL at 12/04/21 2201   sodium chloride flush (NS) 0.9 % injection 3 mL  3 mL Intravenous PRN Emeterio Reeve, DO       vancomycin Alcus Dad) IVPB 1500 mg/300 mL  1,500 mg Intravenous Q12H Emeterio Reeve, DO 150 mL/hr at 12/05/21 0548 1,500 mg at 12/05/21 0548    Musculoskeletal: Strength & Muscle Tone:  did not formally assess; patient laying in bed Gait & Station:  did not formally assess; patient laying in bed Patient leans: N/A            Psychiatric Specialty Exam:  Presentation  General Appearance: Appropriate for Environment; Casual (mildly disheveled hair)  Eye Contact:Good  Speech:Clear and Coherent; Normal Rate  Speech Volume:Normal  Handedness:No data recorded  Mood and Affect  Mood:Depressed  Affect:Appropriate; Constricted; Tearful (patient frequently tearful throughout assesment)   Thought Process  Thought Processes:Coherent; Goal Directed; Linear  Descriptions of Associations:Intact  Orientation:Full (Time, Place and Person)  Thought Content:WDL; Logical  History of Schizophrenia/Schizoaffective disorder:No data recorded Duration of Psychotic Symptoms:No data  recorded Hallucinations:Hallucinations: None  Ideas of Reference:None  Suicidal Thoughts:Suicidal Thoughts: No  Homicidal Thoughts:Homicidal Thoughts: No   Sensorium  Memory:Immediate Good; Recent Good; Remote Good  Judgment:Fair  Insight:Fair   Executive Functions  Concentration:No data recorded Attention Span:Good  [place] of Knowledge:Good  Language:Good   Psychomotor Activity  Psychomotor Activity:Psychomotor Activity: Normal   Assets  Assets:Communication Skills; Desire for Improvement; Resilience; Housing   Sleep  Sleep:Sleep: Poor   Physical Exam: Physical Exam Constitutional:      Appearance: Normal appearance. He is normal weight.   HENT:     Head: Normocephalic and atraumatic.  Eyes:     Extraocular Movements: Extraocular movements intact.  Pulmonary:     Effort: Pulmonary effort is normal.  Musculoskeletal:     Comments: L BKA R foot bandaged from recent surgery  Neurological:     General: No focal deficit present.     Mental Status: He is alert.   Review of Systems  Musculoskeletal:        +pain from surgery  Psychiatric/Behavioral:  Positive for depression. Negative for hallucinations, substance abuse and suicidal ideas. The patient is nervous/anxious and has insomnia.   Blood pressure 132/76, pulse 85, temperature 98.7 F (37.1 C), temperature source Oral, resp. rate 18, height 6' 2.02" (1.88 m), weight 127 kg, SpO2 99 %. Body mass index is 35.93 kg/m.  Treatment Plan Summary: SUTTER AHLGREN is a 56 y.o. male patient with no reported psychiatric history who is admitted with gangrene, osteomyelitis. 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 Per chart review, patietn reported thoughts of wanting to harm himself and reported that he owns lots of guns. Psychiatry was consulted for SI.  Patient reports feeling depressed as well as high anxiety. He reports associated depressive sx of decreased appetite associated with weight loss of ~20 lbs ,poor sleep, anhedonia, hopelessness. Patient had reported that he had not eaten in ~ 4 days prior to coming to the hospital d/t decreased appetite.He is amenable to trial of remeron for mood , anxiety, sleep, decreased appetite. Patient is also agreeable to chaplain consult.  Recommendations -start remeron 7.5 mg qhs  -consult to chaplain -patient would benefit from outpatient therapy and medication management upon discharge if he is agreeable -patient denies SI today; however, he does have some risk factors for suicide-see below-may need to do safety planning prior to d/c/contact collateral  given patient owns multiple guns -continue PRN xanax as ordered--ultimately  patient would benefit from tapering this medication; however, that would be most appropriately managed by outpatient provider as benzodiazepine tapers can be a lengthy process.   The patient demonstrates the following risk factors for suicide: Chronic risk factors for suicide include: previous suicide attempts (x1 as a teenager), medical illness diabetes, multiple amputations r/t diabetes, and chronic pain. Acute risk factors for suicide include: social withdrawal/isolation, loss (financial, interpersonal, professional), and minimal family support . Protective factors for this patient include: responsibility to others (children, family).   Psychiatry to follow up tomorrow  Recommendations communicated to Dr. Doristine Bosworth via epic secure chat.     Disposition: per primary  Ival Bible, MD 12/05/2021 2:43 PM

## 2021-12-05 NOTE — Plan of Care (Signed)

## 2021-12-05 NOTE — Progress Notes (Signed)
Physical Therapy Treatment Patient Details Name: Eugene Diaz MRN: [id number] DOB: [birth year] Today's Date: 12/05/2021   History of Present Illness The pt is a 56 yo male presenting originally 1/30 then again 2/3 with gangrene of R third toe. Nos s/p R transmet amputation with Achilles tendon lengthening on 2/4. PMH includes: L BKA, DM II, alcohol abuse, anxiety, depression, HTN, PVD, and R great toe amputation.    PT Comments    The pt was seen for mobility progression with CAM boot to follow MD orders. Ortho tech present at start of session to assist with fit of CAM walker boot, more details in her note. Now that he is able to wt bear through RLE for transfers, the pt complete x8 sit-stand transfers with progressively lower EOB and improved from minA to minG with reps. He was then able to complete pivot transfer to low chair and safely complete sit-stand from low recliner surface with minG only. Lengthy discussion regarding mobility in the home and use of DME to improve safety and facilitate healing. Pt verbalizes understanding. Will follow up tomorrow to work on progression of pt mobility and safe mobility training on crutches.    Recommendations for follow up therapy are one component of a multi-disciplinary discharge planning process, led by the attending physician.  Recommendations may be updated based on patient status, additional functional criteria and insurance authorization.  Follow Up Recommendations  Home health PT     Assistance Recommended at Discharge Intermittent Supervision/Assistance  Patient can return home with the following A little help with walking and/or transfers;Assistance with cooking/housework;Assist for transportation;Help with stairs or ramp for entrance   Equipment Recommendations  None recommended by PT    Recommendations for Other Services       Precautions / Restrictions Precautions Precautions: Fall Restrictions Weight Bearing Restrictions:  Yes RLE Weight Bearing: Weight bearing as tolerated Other Position/Activity Restrictions: WBAT for transfers only in CAM boot     Mobility  Bed Mobility Overal bed mobility: Modified Independent Bed Mobility: Supine to Sit     Supine to sit: Min assist     General bed mobility comments: pt transitioning from supine to sitting EOB and donning L prosthetic while PT out of room talking to orthotech    Transfers Overall transfer level: Needs assistance Equipment used: Rolling walker (2 wheels) Transfers: Sit to/from Stand, Bed to chair/wheelchair/BSC Sit to Stand: Min assist, Min guard   Step pivot transfers: Min guard       General transfer comment: pt able to complete x8 sit-stand from progressively lower height and progressed from minA to minG with continued reps. eventually able tocomplete from low recliner with minG as well using RW. Pt tried crutches once, but required min-modA to complete stand.    Ambulation/Gait Ambulation/Gait assistance: Min guard Gait Distance (Feet): 2 Feet Assistive device: Rolling walker (2 wheels)         General Gait Details: pivotal steps to recliner, pt currently limited to transfers only per MD         Balance Overall balance assessment: Needs assistance Sitting-balance support: No upper extremity supported, Feet unsupported Sitting balance-Leahy Scale: Good Sitting balance - Comments: able to don L prosthetic without LOB   Standing balance support: Bilateral upper extremity supported, Reliant on assistive device for balance Standing balance-Leahy Scale: Poor Standing balance comment: dependent on BUE support for mobility  Cognition Arousal/Alertness: Awake/alert Behavior During Therapy: WFL for tasks assessed/performed Overall Cognitive Status: Impaired/Different from baseline Area of Impairment: Safety/judgement                         Safety/Judgement: Decreased awareness  of safety, Decreased awareness of deficits     General Comments: pt needing cues for safety, then agreeable to PT suggestions        Exercises      General Comments General comments (skin integrity, edema, etc.): VSS on RA. orthotech present to assist with fitting boot.      Pertinent Vitals/Pain Pain Assessment Pain Assessment: Faces Faces Pain Scale: Hurts even more Pain Location: R foot Pain Descriptors / Indicators: Discomfort Pain Intervention(s): Monitored during session, Repositioned    Home Living Family/patient expects to be discharged to:: Private residence Living Arrangements: Alone Available Help at Discharge: Family;Available PRN/intermittently Type of Home: Mobile home Home Access: Stairs to enter;Ramped entrance Entrance Stairs-Rails: None Entrance Stairs-Number of Steps: 1   Home Layout: One level Home Equipment: Conservation officer, nature (2 wheels);Crutches;BSC/3in1;Wheelchair - manual Additional Comments: pt uses crutches    Prior Function            PT Goals (current goals can now be found in the care plan section) Acute Rehab PT Goals Patient Stated Goal: return home not to go to rehab PT Goal Formulation: With patient Time For Goal Achievement: 12/19/21 Potential to Achieve Goals: Good Progress towards PT goals: Progressing toward goals    Frequency    Min 3X/week      PT Plan Current plan remains appropriate       AM-PAC PT "6 Clicks" Mobility   Outcome Measure  Help needed turning from your back to your side while in a flat bed without using bedrails?: A Little Help needed moving from lying on your back to sitting on the side of a flat bed without using bedrails?: A Little Help needed moving to and from a bed to a chair (including a wheelchair)?: A Little Help needed standing up from a chair using your arms (e.g., wheelchair or bedside chair)?: A Little Help needed to walk in hospital room?: A Little Help needed climbing 3-5 steps with a  railing? : A Lot 6 Click Score: 17    End of Session Equipment Utilized During Treatment: Gait belt;Other (comment) (CAM boot RLE) Activity Tolerance: Patient tolerated treatment well Patient left: in bed;with call bell/phone within reach;with bed alarm set Nurse Communication: Mobility status (called ortho tech for larger boot) PT Visit Diagnosis: Other abnormalities of gait and mobility (R26.89);Muscle weakness (generalized) (M62.81);Unsteadiness on feet (R26.81);Pain Pain - Right/Left: Right Pain - part of body: Ankle and joints of foot     Time: 1423-9532 PT Time Calculation (min) (ACUTE ONLY): 36 min  Charges:  $Therapeutic Exercise: 8-22 mins $Therapeutic Activity: 8-22 mins                     West Carbo, PT, DPT   Acute Rehabilitation Department Pager #: [phone]    Sandra Cockayne 12/05/2021, 12:39 PM

## 2021-12-05 NOTE — Evaluation (Signed)
Physical Therapy Evaluation Patient Details Name: Eugene Diaz MRN: [id number] DOB: [birth year] Today's Date: 12/05/2021  History of Present Illness  The pt is a 56 yo male presenting originally 1/30 then again 2/3 with gangrene of R third toe. Nos s/p R transmet amputation with Achilles tendon lengthening on 2/4. PMH includes: L BKA, DM II, alcohol abuse, anxiety, depression, HTN, PVD, and R great toe amputation.   Clinical Impression  Pt in bed upon arrival of PT, agreeable to evaluation at this time. Prior to admission the pt was mobilizing with use of crutches for distances in the home, but also used WC and a rolling office chair at times for mobility in the home. The pt now presents with limitations in functional mobility, strength, power, activity tolerance, and stability due to above dx, and will continue to benefit from skilled PT to address these deficits. Today's session was limited by CAM boot present not appropriately fitting the pt's RLE upon my arrival, the pt was agreeable to attempt sit-stand transfer with NWB RLE and use of LLE prosthetic, but was unable to achieve more than small hip clearance despite significant assist. The pt was thoroughly educated on WB precautions, limitations to transfer with NWB after d/c home and discussing how to complete IADLs safely with his limited social support. I plan to further assess OOB mobility in second visit this afternoon after delivery of larger boot. At this time, the pt is adamant about return home and I do anticipate better mobility when allowed WB through RLE. Will update recommendations as needed.         Recommendations for follow up therapy are one component of a multi-disciplinary discharge planning process, led by the attending physician.  Recommendations may be updated based on patient status, additional functional criteria and insurance authorization.  Follow Up Recommendations Home health PT    Assistance Recommended at Discharge  Intermittent Supervision/Assistance  Patient can return home with the following  A little help with walking and/or transfers;Assistance with cooking/housework;Assist for transportation;Help with stairs or ramp for entrance    Equipment Recommendations None recommended by PT  Recommendations for Other Services       Functional Status Assessment Patient has had a recent decline in their functional status and demonstrates the ability to make significant improvements in function in a reasonable and predictable amount of time.     Precautions / Restrictions Precautions Precautions: Fall Restrictions Weight Bearing Restrictions: Yes RLE Weight Bearing: Weight bearing as tolerated Other Position/Activity Restrictions: WBAT for transfers only in CAM boot      Mobility  Bed Mobility Overal bed mobility: Needs Assistance Bed Mobility: Supine to Sit     Supine to sit: Min assist     General bed mobility comments: minA to RLE to assist with clearing foot from bed. pt using bed rails to complete trunk movement    Transfers Overall transfer level: Needs assistance Equipment used: Rolling walker (2 wheels) Transfers: Sit to/from Stand Sit to Stand: Total assist           General transfer comment: pt unable to complete more than slight hip clearance from bed despite bed elevated and maxA. further mobility deferred as pt unable to move with RLE NWB and CAM boot that is present does not fit    Ambulation/Gait               General Gait Details: deferred as pt unable to move with RLE NWB and CAM boot that is present does not  fit      Balance Overall balance assessment: Needs assistance Sitting-balance support: No upper extremity supported, Feet unsupported Sitting balance-Leahy Scale: Fair Sitting balance - Comments: single LOB while doffing prosthetic   Standing balance support: Bilateral upper extremity supported, Reliant on assistive device for balance Standing  balance-Leahy Scale: Poor                               Pertinent Vitals/Pain Pain Assessment Pain Assessment: Faces Faces Pain Scale: Hurts a little bit Pain Location: R foot Pain Descriptors / Indicators: Discomfort Pain Intervention(s): Limited activity within patient's tolerance, Monitored during session, Repositioned, Premedicated before session    Home Living Family/patient expects to be discharged to:: Private residence Living Arrangements: Alone Available Help at Discharge: Family;Available PRN/intermittently Type of Home: Mobile home Home Access: Stairs to enter;Ramped entrance Entrance Stairs-Rails: None Entrance Stairs-Number of Steps: 1   Home Layout: One level Home Equipment: Conservation officer, nature (2 wheels);Crutches;BSC/3in1;Wheelchair - manual Additional Comments: pt uses crutches    Prior Function Prior Level of Function : Independent/Modified Independent             Mobility Comments: pt reports use of crutches with no falls. no longer driving. pt sleeps on couch and mostly uses office chair as knee scooter for mobility in the kitchen ADLs Comments: pt reports independence with ADLs, sister in [place] orders groceries to his house     Hand Dominance   Dominant Hand: Left    Extremity/Trunk Assessment   Upper Extremity Assessment Upper Extremity Assessment: Overall WFL for tasks assessed    Lower Extremity Assessment Lower Extremity Assessment: RLE deficits/detail;LLE deficits/detail RLE Deficits / Details: treated as NWB due to lack of fitting CAM boot RLE Sensation: decreased light touch LLE Deficits / Details: chronic BKA, able to don prosthetic without assist LLE Sensation: WNL LLE Coordination: WNL    Cervical / Trunk Assessment Cervical / Trunk Assessment: Normal  Communication   Communication: No difficulties  Cognition Arousal/Alertness: Awake/alert Behavior During Therapy: WFL for tasks assessed/performed Overall Cognitive  Status: Impaired/Different from baseline Area of Impairment: Safety/judgement                         Safety/Judgement: Decreased awareness of safety, Decreased awareness of deficits     General Comments: pt needing cues for safety, then agreeable to PT suggestions        General Comments General comments (skin integrity, edema, etc.): VSS on RA    Exercises     Assessment/Plan    PT Assessment Patient needs continued PT services  PT Problem List Decreased strength;Decreased range of motion;Decreased activity tolerance;Decreased balance;Decreased mobility;Decreased safety awareness       PT Treatment Interventions DME instruction;Gait training;Stair training;Functional mobility training;Therapeutic activities;Therapeutic exercise;Balance training;Patient/family education    PT Goals (Current goals can be found in the Care Plan section)  Acute Rehab PT Goals Patient Stated Goal: return home not to go to rehab PT Goal Formulation: With patient Time For Goal Achievement: 12/19/21 Potential to Achieve Goals: Good    Frequency Min 3X/week        AM-PAC PT "6 Clicks" Mobility  Outcome Measure Help needed turning from your back to your side while in a flat bed without using bedrails?: A Little Help needed moving from lying on your back to sitting on the side of a flat bed without using bedrails?: A Little Help needed moving to and  from a bed to a chair (including a wheelchair)?: Total Help needed standing up from a chair using your arms (e.g., wheelchair or bedside chair)?: Total Help needed to walk in hospital room?: Total Help needed climbing 3-5 steps with a railing? : Total 6 Click Score: 10    End of Session Equipment Utilized During Treatment: Gait belt;Other (comment) (CAM boot RLE) Activity Tolerance: Patient tolerated treatment well Patient left: in bed;with call bell/phone within reach;with bed alarm set Nurse Communication: Mobility status (called  ortho tech for larger boot) PT Visit Diagnosis: Other abnormalities of gait and mobility (R26.89);Muscle weakness (generalized) (M62.81);Unsteadiness on feet (R26.81);Pain Pain - Right/Left: Right Pain - part of body: Ankle and joints of foot    Time: 5643-3295 PT Time Calculation (min) (ACUTE ONLY): 30 min   Charges:   PT Evaluation $PT Eval Moderate Complexity: 1 Mod PT Treatments $Therapeutic Activity: 8-22 mins        West Carbo, PT, DPT   Acute Rehabilitation Department Pager #: [phone]  Sandra Cockayne 12/05/2021, 11:36 AM

## 2021-12-05 NOTE — Progress Notes (Signed)
Orthopedic Tech Progress Note Patient Details:  Eugene Diaz [birth year] [id number]  Received call from [place], PT re: CAM boot not fully closing over forefoot causing his PT session to be limited. She kindly asked for a bigger boot to brought up to try. I brought up a size large CAM boot and compared it for size and also tried on the medium again. Both times the distal Velcro closure would not fully close, leaving a 1/2" gap due to the bulky dressing. Ultimately we decided to stay with the size medium boot, but left out the plastic guard that lays over the top of the foot. The boot is able to be secured with the Velcro straps without it being too tight at the forefoot as I was still able to fit one finger between the strap and bandage. Katie agreed this seemed like the best option and the changes to leave the plastic guard off the boot until the dressing is less bulky would not pose any safety risk or loss of functionality. Pt did not report any pain or discomfort caused by this change.  Ortho Devices Type of Ortho Device: CAM walker Ortho Device/Splint Location: RLE Ortho Device/Splint Interventions: Ordered, Application, Adjustment   Post Interventions Patient Tolerated: Well Instructions Provided: Care of device, Adjustment of device  [name] [name] 12/05/2021, 12:08 PM

## 2021-12-05 NOTE — Progress Notes (Signed)
Subjective: Eugene Diaz is a 56 y.o. male patient seen at bedside, resting comfortably in no acute distress s/p day #1 Right right transmetatarsal amputation with percutaneous tendo Achilles lengthening. Patient admits some pain at the surgical site at the back of the leg Achilles, denies calf pain, denies headache, chest pain, shortness of breath, nausea, vomitting, denies loss of appetite, denies problems with voiding. No other issues noted.   Patient Active Problem List   Diagnosis Date Noted   Gangrene right third toe associated with type 2 diabetes mellitus ([place]), likely osteomyelitis 12/03/2021   Type 2 diabetes mellitus ([place]) 12/03/2021   Osteomyelitis of right foot ([place])    Gangrene ([place])    Osteomyelitis of great toe of right foot ([place]) 08/18/2021   Severe depression ([place]) 06/09/2021   Suicidal ideation 06/09/2021   Diabetic wet gangrene of the foot ([place])    Unilateral complete BKA, left, initial encounter ([place]) 06/30/2020   Acute blood loss anemia 01/04/2020   Asthma    Depression with anxiety    Hyperglycemia 12/17/2019   Diabetic foot ulcer (HCC)    Anemia, chronic disease 11/10/2019   Marijuana use 11/10/2019   Wound dehiscence 08/31/2019   Hyponatremia 08/14/2019   Normocytic anemia 08/14/2019   B12 deficiency 08/14/2019   Subacute osteomyelitis, left ankle and foot (HCC)    GERD (gastroesophageal reflux disease) 12/19/2018   Hepatic steatosis 06/18/2018   Gastritis, erosive    Dysphagia, idiopathic 12/27/2017   Rectal bleeding 12/27/2017   Intention tremor 06/03/2016   Left knee pain 25/42/7062   Alcoholic peripheral neuropathy ([place]) 10/08/2014   Diabetic neuropathy ([place]) 10/08/2014   Chest pain, rule out acute myocardial infarction 03/10/2014   Thrombocytopenia ([place]) 03/10/2014   ETOH abuse 03/10/2014   Diabetes mellitus without complication ([place])    Hypertension associated with type 2 diabetes mellitus ([place])    Hyperlipidemia associated with type 2  diabetes mellitus ([place])      Current Facility-Administered Medications:    0.9 %  sodium chloride infusion, 250 mL, Intravenous, PRN, Emeterio Reeve, DO   acetaminophen (TYLENOL) tablet 650 mg, 650 mg, Oral, Q6H PRN, Pahwani, Rinka R, MD, 650 mg at 12/04/21 1710   ALPRAZolam (XANAX) tablet 0.5 mg, 0.5 mg, Oral, QID PRN, Emeterio Reeve, DO, 0.5 mg at 12/04/21 1439   amLODipine (NORVASC) tablet 10 mg, 10 mg, Oral, Daily, Emeterio Reeve, DO, 10 mg at 12/05/21 1013   atorvastatin (LIPITOR) tablet 40 mg, 40 mg, Oral, QPM, Emeterio Reeve, DO, 40 mg at 12/04/21 1904   ceFEPIme (MAXIPIME) 2 g in sodium chloride 0.9 % 100 mL IVPB, 2 g, Intravenous, Q8H, Emeterio Reeve, DO, Last Rate: 200 mL/hr at 12/05/21 1011, 2 g at 12/05/21 1011   Chlorhexidine Gluconate Cloth 2 % PADS 6 each, 6 each, Topical, Daily, Pahwani, Einar Grad, MD, 6 each at 12/04/21 1711   insulin aspart (novoLOG) injection 0-15 Units, 0-15 Units, Subcutaneous, TID WC, Emeterio Reeve, DO, 2 Units at 12/04/21 1713   insulin aspart (novoLOG) injection 0-5 Units, 0-5 Units, Subcutaneous, QHS, Alexander, Natalie, DO   lisinopril (ZESTRIL) tablet 40 mg, 40 mg, Oral, Daily, Emeterio Reeve, DO, 40 mg at 12/05/21 1013   morphine (MS CONTIN) 12 hr tablet 30 mg, 30 mg, Oral, Q12H, Emeterio Reeve, DO, 30 mg at 12/05/21 1013   morphine (PF) 2 MG/ML injection 2 mg, 2 mg, Intravenous, Q2H PRN, Emeterio Reeve, DO, 2 mg at 12/05/21 1013   mupirocin ointment (BACTROBAN) 2 % 1 application, 1 application, Nasal, BID, Sheppard Coil,  Natalie, DO, 1 application at 26/94/85 1017   sodium chloride flush (NS) 0.9 % injection 3 mL, 3 mL, Intravenous, Q12H, Emeterio Reeve, DO, 3 mL at 12/04/21 2201   sodium chloride flush (NS) 0.9 % injection 3 mL, 3 mL, Intravenous, PRN, Emeterio Reeve, DO   [COMPLETED] vancomycin (VANCOCIN) 2,500 mg in sodium chloride 0.9 % 500 mL IVPB, 2,500 mg, Intravenous, Once, Stopped at 12/03/21 2311  **FOLLOWED BY** vancomycin (VANCOREADY) IVPB 1500 mg/300 mL, 1,500 mg, Intravenous, Q12H, Emeterio Reeve, DO, Last Rate: 150 mL/hr at 12/05/21 0548, 1,500 mg at 12/05/21 0548  No Known Allergies   Objective: Today's Vitals   12/05/21 0151 12/05/21 0221 12/05/21 0513 12/05/21 0747  BP:   126/63 (!) 143/70  Pulse:   69 69  Resp:   16 18  Temp:   98.7 F (37.1 C) 98.4 F (36.9 C)  TempSrc:   Oral Oral  SpO2:   99% 99%  Weight:      Height:      PainSc: 8  3       General: No acute distress  Right Lower extremity: Dressing to Right foot clean, dry, intact. No strikethrough noted, Upon removal of dressings sutures and staples intact with no dehiscence. Decreased erythema, decreased edema, mild bloody drainage. No other acute signs of infection. No calf pain. Range of motion excluding surgical site within normal limits with no pain or crepitation.         Results for orders placed or performed during the hospital encounter of 12/03/21  Culture, blood (routine x 2)     Status: None (Preliminary result)   Collection Time: 12/03/21  3:58 PM   Specimen: BLOOD  Result Value Ref Range Status   Specimen Description BLOOD SITE NOT SPECIFIED  Final   Special Requests   Final    BOTTLES DRAWN AEROBIC AND ANAEROBIC Blood Culture adequate volume   Culture   Final    NO GROWTH < 24 HOURS Performed at [facility], [place] [address], [place], [place] [address]    Report Status PENDING  Incomplete  Culture, blood (routine x 2)     Status: None (Preliminary result)   Collection Time: 12/03/21  5:25 PM   Specimen: BLOOD  Result Value Ref Range Status   Specimen Description BLOOD RIGHT ANTECUBITAL  Final   Special Requests   Final    BOTTLES DRAWN AEROBIC AND ANAEROBIC Blood Culture results may not be optimal due to an inadequate volume of blood received in culture bottles   Culture   Final    NO GROWTH < 24 HOURS Performed at [facility], [place] [address] [place],  [place] [address]    Report Status PENDING  Incomplete  Resp Panel by RT-PCR (Flu A&B, Covid) Nasopharyngeal Swab     Status: None   Collection Time: 12/03/21  6:25 PM   Specimen: Nasopharyngeal Swab; Nasopharyngeal(NP) swabs in vial transport medium  Result Value Ref Range Status   SARS Coronavirus 2 by RT PCR NEGATIVE NEGATIVE Final    Comment: (NOTE) SARS-CoV-2 target nucleic acids are NOT DETECTED.  The SARS-CoV-2 RNA is generally detectable in upper respiratory specimens during the acute phase of infection. The lowest concentration of SARS-CoV-2 viral copies this assay can detect is 138 copies/mL. A negative result does not preclude SARS-Cov-2 infection and should not be used as the sole basis for treatment or other patient management decisions. A negative result may occur with  improper specimen collection/handling, submission of  specimen other than nasopharyngeal swab, presence of viral mutation(s) within the areas targeted by this assay, and inadequate number of viral copies(<138 copies/mL). A negative result must be combined with clinical observations, patient history, and epidemiological information. The expected result is Negative.  Fact Sheet for Patients:  EntrepreneurPulse.com.au  Fact Sheet for Healthcare Providers:  IncredibleEmployment.be  This test is no t yet approved or cleared by the [place] FDA and  has been authorized for detection and/or diagnosis of SARS-CoV-2 by FDA under an Emergency Use Authorization (EUA). This EUA will remain  in effect (meaning this test can be used) for the duration of the COVID-19 declaration under Section 564(b)(1) of the Act, 21 U.S.C.section 360bbb-3(b)(1), unless the authorization is terminated  or revoked sooner.       Influenza A by PCR NEGATIVE NEGATIVE Final   Influenza B by PCR NEGATIVE NEGATIVE Final    Comment: (NOTE) The Xpert Xpress SARS-CoV-2/FLU/RSV plus assay is intended as an  aid in the diagnosis of influenza from Nasopharyngeal swab specimens and should not be used as a sole basis for treatment. Nasal washings and aspirates are unacceptable for Xpert Xpress SARS-CoV-2/FLU/RSV testing.  Fact Sheet for Patients: EntrepreneurPulse.com.au  Fact Sheet for Healthcare Providers: IncredibleEmployment.be  This test is not yet approved or cleared by the [place] FDA and has been authorized for detection and/or diagnosis of SARS-CoV-2 by FDA under an Emergency Use Authorization (EUA). This EUA will remain in effect (meaning this test can be used) for the duration of the COVID-19 declaration under Section 564(b)(1) of the Act, 21 U.S.C. section 360bbb-3(b)(1), unless the authorization is terminated or revoked.  Performed at [facility], [place] [address] [place], [place] [address]   Surgical PCR screen     Status: Abnormal   Collection Time: 12/04/21 11:33 AM   Specimen: Nasal Mucosa; Nasal Swab  Result Value Ref Range Status   MRSA, PCR POSITIVE (A) NEGATIVE Final    Comment: RESULT CALLED TO, READ BACK BY AND VERIFIED WITH: RN Ray Church [phone] AT 1448 BY CM    Staphylococcus aureus POSITIVE (A) NEGATIVE Final    Comment: (NOTE) The Xpert SA Assay (FDA approved for NASAL specimens in patients 68 years of age and older), is one component of a comprehensive surveillance program. It is not intended to diagnose infection nor to guide or monitor treatment. Performed at [facility], [place] [address] [place], [place] [address]       Post Op Xray, Right foot: Consistent with postoperative status  Assessment and Plan:  Problem List Items Addressed This Visit       Endocrine   * (Principal) Gangrene right third toe associated with type 2 diabetes mellitus ([place]), likely osteomyelitis (Chronic)    -[name] and MRI currently pending -Podiatry consulted, likely 2 OR in a.m. pending MRI results, patient n.p.o. after  midnight -Based on previous culture results, continue vancomycin and cefepime      Relevant Medications   lisinopril (ZESTRIL) tablet 40 mg   atorvastatin (LIPITOR) tablet 40 mg   insulin aspart (novoLOG) injection 0-15 Units   insulin aspart (novoLOG) injection 0-5 Units     Musculoskeletal and Integument   Osteomyelitis of right foot (HCC) - Primary   Relevant Medications   vancomycin (VANCOREADY) IVPB 1500 mg/300 mL   ceFEPIme (MAXIPIME) 2 g in sodium chloride 0.9 % 100 mL IVPB   mupirocin ointment (BACTROBAN) 2 % 1 application     Other   Gangrene ([place])  Other Visit Diagnoses     S/P foot surgery, right       Relevant Orders   DG Foot 2 Views Right (Completed)       -Patient seen and evaluated at bedside -Chart reviewed -Dressing change preformed; applied dry dressing to right foot -Advised patient to make sure to keep dressing clean, dry, and intact to Right foot allowing nursing to change/reinforce dressings as ordered if there is strike through  -Awaiting intra-op cultures; continue with IV antibiotics until pre-limb cultures have resulted  -Weightbearing as tolerated  with cam boot on the right, prosthetic on the left and crutch assistive device; recommend PT to work with patient prior to discharge -Continue with rest and elevation to assist with pain and edema control  -Recommend to follow-up ABIs likely patient can follow-up if abnormal with vascular outpatient -Podiatry will continue to follow closely  -Anticipated discharge plan of care: To home likely tomorrow with oral antibiotics for 2 weeks for soft tissue protection.  Patient will follow up in office for dressing changes weekly with [name] who he normally sees; patient to keep dressings clean dry and intact. Office will contact patient for follow up appointment after discharge.  Landis Martins, DPM [facility] [phone] office [phone] cell Available via secure chat

## 2021-12-06 ENCOUNTER — Inpatient Hospital Stay (HOSPITAL_COMMUNITY): Payer: Medicare Other

## 2021-12-06 DIAGNOSIS — D638 Anemia in other chronic diseases classified elsewhere: Secondary | ICD-10-CM | POA: Diagnosis not present

## 2021-12-06 DIAGNOSIS — I96 Gangrene, not elsewhere classified: Secondary | ICD-10-CM

## 2021-12-06 DIAGNOSIS — E1169 Type 2 diabetes mellitus with other specified complication: Secondary | ICD-10-CM | POA: Diagnosis not present

## 2021-12-06 DIAGNOSIS — E1152 Type 2 diabetes mellitus with diabetic peripheral angiopathy with gangrene: Secondary | ICD-10-CM | POA: Diagnosis not present

## 2021-12-06 DIAGNOSIS — Z01810 Encounter for preprocedural cardiovascular examination: Secondary | ICD-10-CM

## 2021-12-06 LAB — GLUCOSE, CAPILLARY
Glucose-Capillary: 111 mg/dL — ABNORMAL HIGH (ref 70–99)
Glucose-Capillary: 100 mg/dL — ABNORMAL HIGH (ref 70–99)
Glucose-Capillary: 117 mg/dL — ABNORMAL HIGH (ref 70–99)
Glucose-Capillary: 172 mg/dL — ABNORMAL HIGH (ref 70–99)
Glucose-Capillary: 204 mg/dL — ABNORMAL HIGH (ref 70–99)

## 2021-12-06 LAB — CBC
HCT: 30.1 % — ABNORMAL LOW (ref 39.0–52.0)
Hemoglobin: 9.8 g/dL — ABNORMAL LOW (ref 13.0–17.0)
MCH: 28.8 pg (ref 26.0–34.0)
MCHC: 32.6 g/dL (ref 30.0–36.0)
MCV: 88.5 fL (ref 80.0–100.0)
Platelets: 281 10*3/uL (ref 150–400)
RBC: 3.4 MIL/uL — ABNORMAL LOW (ref 4.22–5.81)
RDW: 14.1 % (ref 11.5–15.5)
WBC: 7.4 10*3/uL (ref 4.0–10.5)
nRBC: 0 % (ref 0.0–0.2)

## 2021-12-06 MED ORDER — SULFAMETHOXAZOLE-TRIMETHOPRIM 800-160 MG PO TABS
1.0000 | ORAL_TABLET | Freq: Two times a day (BID) | ORAL | Status: DC
  Administered 2021-12-06 – 2021-12-07 (×3): 1 via ORAL
  Filled 2021-12-06 (×3): qty 1

## 2021-12-06 MED ORDER — ENOXAPARIN SODIUM 40 MG/0.4ML IJ SOSY
40.0000 mg | PREFILLED_SYRINGE | INTRAMUSCULAR | Status: DC
  Administered 2021-12-06: 40 mg via SUBCUTANEOUS
  Filled 2021-12-06: qty 0.4

## 2021-12-06 NOTE — Plan of Care (Signed)

## 2021-12-06 NOTE — Progress Notes (Signed)
°   12/06/21 1115  Clinical Encounter Type  Visited With Patient  Visit Type Spiritual support  Referral From Nurse  Consult/Referral To Chaplain  Spiritual Encounters  Spiritual Needs Prayer;Emotional   Chaplain spent some time with patient who was experiencing loss of mobility which was coupled with the death of his wife of 8 years. Based on details shared, the patient is not hopeless but is experiencing the companion, his career, his independence, and blames God for his current health status. Chaplain provided spiritual support through prayer and supportive feedback and patient thanked me for coming.

## 2021-12-06 NOTE — Progress Notes (Signed)
[name] has been completed.   Results can be found under chart review under CV PROC. 12/06/2021 1:40 PM Mahasin Riviere RVT, RDMS

## 2021-12-06 NOTE — Progress Notes (Signed)
Physical Therapy Treatment Patient Details Name: Eugene Diaz MRN: [id number] DOB: 1966/04/13 Today's Date: 12/06/2021   History of Present Illness The pt is a 56 yo male presenting originally 1/30 then again 2/3 with gangrene of R third toe. Nos s/p R transmet amputation with Achilles tendon lengthening on 2/4. PMH includes: L BKA, DM II, alcohol abuse, anxiety, depression, HTN, PVD, and R great toe amputation.    PT Comments    Pt was able to perform transfers and ambulation with gross min guard assist and crutches for support. Pt eager to progress ambulation distance, however limited to functional distances at this time. Will continue to follow and progress as able per POC.     Recommendations for follow up therapy are one component of a multi-disciplinary discharge planning process, led by the attending physician.  Recommendations may be updated based on patient status, additional functional criteria and insurance authorization.  Follow Up Recommendations  Home health PT     Assistance Recommended at Discharge Intermittent Supervision/Assistance  Patient can return home with the following A little help with walking and/or transfers;Assistance with cooking/housework;Assist for transportation;Help with stairs or ramp for entrance   Equipment Recommendations  None recommended by PT    Recommendations for Other Services       Precautions / Restrictions Precautions Precautions: Fall Precaution Comments: L BKA Restrictions Weight Bearing Restrictions: Yes RLE Weight Bearing: Weight bearing as tolerated Other Position/Activity Restrictions: WBAT in CAM boot     Mobility  Bed Mobility Overal bed mobility: Modified Independent Bed Mobility: Supine to Sit, Sit to Supine           General bed mobility comments: No assist required for transition to/from supine. Pt reports he sleeps on the couch at home.    Transfers Overall transfer level: Needs assistance Equipment used:  Crutches Transfers: Sit to/from Stand Sit to Stand: Min guard           General transfer comment: Pt powered up to full stand from low bed height and low recliner height. Increased time but no assist required. Wide BOS required, and pt with heavy lean on crutches once in place.    Ambulation/Gait Ambulation/Gait assistance: Min guard Gait Distance (Feet): 25 Feet (x2) Assistive device: Crutches Gait Pattern/deviations: Step-through pattern, Decreased stride length, Wide base of support Gait velocity: Decreased Gait velocity interpretation: <1.31 ft/sec, indicative of household ambulator   General Gait Details: Pt ambulating around room - limited distance to functional distance for him at home with crutches (to/from bathroom). Pt antalgic as he reports he is not used to wearing his prosthetic, however he did not have any overt LOB. Pt moving slow but deliberately. Heavy lean on crutches under his arms despite cues for improved posture.   Stairs Stairs:  (Deferred stairs as pt has a ramp at home.)           Wheelchair Mobility    Modified Rankin (Stroke Patients Only)       Balance Overall balance assessment: Needs assistance Sitting-balance support: No upper extremity supported, Feet unsupported Sitting balance-Leahy Scale: Good Sitting balance - Comments: able to don L prosthetic and R boot without LOB   Standing balance support: Bilateral upper extremity supported, Reliant on assistive device for balance Standing balance-Leahy Scale: Poor Standing balance comment: dependent on BUE support for mobility                            Cognition Arousal/Alertness: Awake/alert  Behavior During Therapy: WFL for tasks assessed/performed Overall Cognitive Status: Impaired/Different from baseline Area of Impairment: Safety/judgement                         Safety/Judgement: Decreased awareness of safety, Decreased awareness of deficits     General  Comments: pt needing cues for safety, then agreeable to PT suggestions        Exercises General Exercises - Lower Extremity Ankle Circles/Pumps: 10 reps Long Arc Quad: 20 reps    General Comments        Pertinent Vitals/Pain Pain Assessment Pain Assessment: Faces Faces Pain Scale: Hurts a little bit Pain Location: R foot Pain Descriptors / Indicators: Discomfort Pain Intervention(s): Limited activity within patient's tolerance, Monitored during session, Repositioned    Home Living Family/patient expects to be discharged to:: Private residence Living Arrangements: Alone Available Help at Discharge: Family;Available PRN/intermittently Type of Home: Mobile home Home Access: Stairs to enter;Ramped entrance Entrance Stairs-Rails: None Entrance Stairs-Number of Steps: 1   Home Layout: One level Home Equipment: Conservation officer, nature (2 wheels);Crutches;BSC/3in1;Wheelchair - manual Additional Comments: pt uses crutches    Prior Function            PT Goals (current goals can now be found in the care plan section) Acute Rehab PT Goals Patient Stated Goal: return home not to go to rehab PT Goal Formulation: With patient Time For Goal Achievement: 12/19/21 Potential to Achieve Goals: Good Progress towards PT goals: Progressing toward goals    Frequency    Min 3X/week      PT Plan Current plan remains appropriate    Co-evaluation              AM-PAC PT "6 Clicks" Mobility   Outcome Measure  Help needed turning from your back to your side while in a flat bed without using bedrails?: A Little Help needed moving from lying on your back to sitting on the side of a flat bed without using bedrails?: A Little Help needed moving to and from a bed to a chair (including a wheelchair)?: A Little Help needed standing up from a chair using your arms (e.g., wheelchair or bedside chair)?: A Little Help needed to walk in hospital room?: A Little Help needed climbing 3-5 steps  with a railing? : A Lot 6 Click Score: 17    End of Session Equipment Utilized During Treatment: Gait belt;Other (comment) (CAM boot RLE) Activity Tolerance: Patient tolerated treatment well Patient left: in bed;with call bell/phone within reach;with bed alarm set Nurse Communication: Mobility status PT Visit Diagnosis: Other abnormalities of gait and mobility (R26.89);Muscle weakness (generalized) (M62.81);Unsteadiness on feet (R26.81);Pain Pain - Right/Left: Right Pain - part of body: Ankle and joints of foot     Time: 6160-7371 PT Time Calculation (min) (ACUTE ONLY): 36 min  Charges:  $Gait Training: 23-37 mins                     Rolinda Roan, PT, DPT Acute Rehabilitation Services Pager: [phone] Office: [phone]     Thelma Comp 12/06/2021, 4:03 PM
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
## 2021-12-06 NOTE — Progress Notes (Signed)
[facility] PROGRESS NOTE    Progress Note  Eugene Diaz  DSK:[id number] DOB: [birth year] DOA: 12/03/2021 PCP: [name], PA-C     Brief Narrative:   Eugene Diaz is an 56 y.o. male past medical history of diabetes mellitus, previous toe amputation including a left below the knee and right first toe comes in with gangrenous right third toe MRI of the foot showed right third toe osteomyelitis podiatry was consulted started on IV Vanco and cefepime he is status post transmetatarsal amputation by podiatrist on 12/04/2021.     Assessment/Plan:   Gangrene right third toe associated with type 2 diabetes mellitus ([place]), likely osteomyelitis Status post transmetatarsal amputation by podiatrist on 12/04/2021. Currently on IV vancomycin and cefepime. I have ordered ABIs today Continue narcotics for pain. Awaiting intraoperative culture. Physical therapy evaluated the patient, he will go home with PT OT. We will need to go home on oral antibiotics CBC shows no differential no leukocytosis hemoglobin is stable at 9.8.  Hypertension associated with type 2 diabetes mellitus (HCC) Blood pressure stable, continue amlodipine lisinopril and hydralazine. I have reviewed his blood pressure is currently stable.  Hyperlipidemia associated with diabetes mellitus type 2: Continue statins.  Diabetes mellitus type 2: A1c of 5.8. Continue sliding scale insulin.  Anemia of chronic disease: His hemoglobin has remained stable after surgery.  Hypokalemia: Replete orally with now resolved.  Major depression: Evaluated by psychiatry no suicidal ideations. Psychiatry recommended Remeron.   DVT prophylaxis: lovenox Family Communication:none Status is: Inpatient Remains inpatient appropriate because: Awaiting ABIs.      Code Status:     Code Status Orders  (From admission, onward)           Start     Ordered   12/03/21 1903  Full code  Continuous        12/03/21 1903            Code Status History     Date Active Date Inactive Code Status Order ID Comments User Context   03/27/2021 2129 04/02/2021 2235 Full Code [id number]  Rolla Plate, DO ED   06/30/2020 1802 07/10/2020 1602 Full Code [id number]  Flora Lipps Inpatient   06/21/2020 1857 06/30/2020 1759 Full Code [id number]  Bethena Roys, MD ED   01/17/2020 2310 01/21/2020 2141 Full Code [id number]  Oswald Hillock, MD Inpatient   01/04/2020 1915 01/07/2020 1622 Full Code [id number]  Lenore Cordia, MD Inpatient   12/17/2019 2111 12/27/2019 2328 Full Code [id number]  Bernadette Hoit, DO ED   11/10/2019 1853 11/15/2019 2050 Full Code [id number]  Paticia Stack, MD ED   10/11/2019 2002 10/18/2019 1841 Full Code [id number]  Etta Quill, DO Inpatient   08/13/2019 1848 08/17/2019 1836 Full Code [id number]  Orene Desanctis, DO Inpatient   07/12/2019 2312 07/18/2019 1558 Full Code [id number]  Shela Leff, MD Inpatient   03/10/2014 1901 03/11/2014 1940 Full Code [id number]  Radene Gunning, NP Inpatient         IV Access:   Peripheral IV   Procedures and diagnostic studies:   DG Foot 2 Views Right  Result Date: 12/04/2021 CLINICAL DATA:  Postoperative right foot surgery for gangrene of the right third toe. EXAM: RIGHT FOOT - 2 VIEW COMPARISON:  12/03/2021 FINDINGS: Interval postoperative changes with amputation of the right forefoot at the proximal/mid metatarsal level. Bone resection margins appear intact. Skin clips and soft tissue gas are likely related to recent surgery. Degenerative changes are  noted in the interphalangeal joints. Small calcaneal spurs. IMPRESSION: Interval postoperative amputation of the right forefoot at the mid metatarsal level. Soft tissue gas is likely postoperative. Electronically Signed   By: Lucienne Capers M.D.   On: 12/04/2021 19:07     Medical Consultants:   None.   Subjective:    Eugene Diaz no complaints pain control.  Objective:    Vitals:    12/05/21 1354 12/05/21 2035 12/06/21 0700 12/06/21 0957  BP: 132/76 138/76 118/68 123/77  Pulse: 85 76 62 79  Resp: 18 19 18 16   Temp: 98.7 F (37.1 C) 98.7 F (37.1 C) 98 F (36.7 C)   TempSrc: Oral  Oral   SpO2: 99% 99% 96% 98%  Weight:      Height:       SpO2: 98 %   Intake/Output Summary (Last 24 hours) at 12/06/2021 1151 Last data filed at 12/06/2021 0333 Gross per 24 hour  Intake 1120 ml  Output 2520 ml  Net -1400 ml   Filed Weights   12/03/21 1547 12/04/21 1204  Weight: 127 kg 127 kg    Exam: General exam: In no acute distress. Respiratory system: Good air movement and clear to auscultation. Cardiovascular system: S1 & S2 heard, RRR. No JVD. Gastrointestinal system: Abdomen is nondistended, soft and nontender.  Extremities: Left below the knee amputation, dressing  on the right. Skin: No rashes, lesions or ulcers Psychiatry: Judgement and insight appear normal. Mood & affect appropriate.    Data Reviewed:    Labs: Basic Metabolic Panel: Recent Labs  Lab 12/03/21 1558 12/04/21 0301 12/04/21 1520 12/05/21 0311  NA 134* 135  --  136  K 3.4* 3.4*  --  4.4  CL 106 105  --  108  CO2 22 21*  --  21*  GLUCOSE 152* 121*  --  115*  BUN 10 9  --  13  CREATININE 0.93 0.83  --  0.89  CALCIUM 9.1 8.7*  --  9.0  MG  --   --  1.7  --    GFR Estimated Creatinine Clearance: 131.2 mL/min (by C-G formula based on SCr of 0.89 mg/dL). Liver Function Tests: Recent Labs  Lab 12/03/21 1558  AST 29  ALT 18  ALKPHOS 78  BILITOT 0.4  PROT 8.8*  ALBUMIN 3.5   No results for input(s): LIPASE, AMYLASE in the last 168 hours. No results for input(s): AMMONIA in the last 168 hours. Coagulation profile No results for input(s): INR, PROTIME in the last 168 hours. COVID-19 Labs  No results for input(s): DDIMER, FERRITIN, LDH, CRP in the last 72 hours.  Lab Results  Component Value Date   SARSCOV2NAA NEGATIVE 12/03/2021   SARSCOV2NAA NEGATIVE 03/27/2021   [place]  NEGATIVE 06/21/2020   [place] NEGATIVE 04/21/2020    CBC: Recent Labs  Lab 12/03/21 1558 12/04/21 0301 12/05/21 0311 12/06/21 0332  WBC 9.2 9.4 8.7 7.4  NEUTROABS 5.1  --   --   --   HGB 11.8* 10.3* 9.6* 9.8*  HCT 35.3* 30.7* 29.2* 30.1*  MCV 87.8 88.7 88.5 88.5  PLT 427* 352 289 281   Cardiac Enzymes: No results for input(s): CKTOTAL, CKMB, CKMBINDEX, TROPONINI in the last 168 hours. BNP (last 3 results) No results for input(s): PROBNP in the last 8760 hours. CBG: Recent Labs  Lab 12/05/21 1238 12/05/21 1637 12/05/21 2032 12/06/21 0805 12/06/21 1118  GLUCAP 132* 109* 140* 100* 117*   D-Dimer: No results for input(s): DDIMER in the last  72 hours. Hgb A1c: Recent Labs    12/04/21 0301  HGBA1C 5.8*   Lipid Profile: No results for input(s): CHOL, HDL, LDLCALC, TRIG, CHOLHDL, LDLDIRECT in the last 72 hours. Thyroid function studies: No results for input(s): TSH, T4TOTAL, T3FREE, THYROIDAB in the last 72 hours.  Invalid input(s): FREET3 Anemia work up: No results for input(s): VITAMINB12, FOLATE, FERRITIN, TIBC, IRON, RETICCTPCT in the last 72 hours. Sepsis Labs: Recent Labs  Lab 12/03/21 1558 12/03/21 1733 12/04/21 0301 12/05/21 0311 12/06/21 0332  WBC 9.2  --  9.4 8.7 7.4  LATICACIDVEN 1.3 1.0  --   --   --    Microbiology Recent Results (from the past 240 hour(s))  Blood Cultures x 2 sites     Status: None   Collection Time: 11/29/21 11:24 AM   Specimen: BLOOD LEFT HAND  Result Value Ref Range Status   Specimen Description BLOOD LEFT HAND  Final   Special Requests   Final    BOTTLES DRAWN AEROBIC AND ANAEROBIC Blood Culture adequate volume   Culture   Final    NO GROWTH 5 DAYS Performed at [facility], [address], [place], [place] [address]    Report Status 12/04/2021 FINAL  Final  Culture, blood (routine x 2)     Status: None (Preliminary result)   Collection Time: 12/03/21  3:58 PM   Specimen: BLOOD  Result Value Ref Range  Status   Specimen Description BLOOD SITE NOT SPECIFIED  Final   Special Requests   Final    BOTTLES DRAWN AEROBIC AND ANAEROBIC Blood Culture adequate volume   Culture   Final    NO GROWTH 3 DAYS Performed at [facility], [place] [address], [place], [place] [address]    Report Status PENDING  Incomplete  Culture, blood (routine x 2)     Status: None (Preliminary result)   Collection Time: 12/03/21  5:25 PM   Specimen: BLOOD  Result Value Ref Range Status   Specimen Description BLOOD RIGHT ANTECUBITAL  Final   Special Requests   Final    BOTTLES DRAWN AEROBIC AND ANAEROBIC Blood Culture results may not be optimal due to an inadequate volume of blood received in culture bottles   Culture   Final    NO GROWTH 3 DAYS Performed at [facility], [place] [address] [place], [place] [address]    Report Status PENDING  Incomplete  Resp Panel by RT-PCR (Flu A&B, Covid) Nasopharyngeal Swab     Status: None   Collection Time: 12/03/21  6:25 PM   Specimen: Nasopharyngeal Swab; Nasopharyngeal(NP) swabs in vial transport medium  Result Value Ref Range Status   SARS Coronavirus 2 by RT PCR NEGATIVE NEGATIVE Final    Comment: (NOTE) SARS-CoV-2 target nucleic acids are NOT DETECTED.  The SARS-CoV-2 RNA is generally detectable in upper respiratory specimens during the acute phase of infection. The lowest concentration of SARS-CoV-2 viral copies this assay can detect is 138 copies/mL. A negative result does not preclude SARS-Cov-2 infection and should not be used as the sole basis for treatment or other patient management decisions. A negative result may occur with  improper specimen collection/handling, submission of specimen other than nasopharyngeal swab, presence of viral mutation(s) within the areas targeted by this assay, and inadequate number of viral copies(<138 copies/mL). A negative result must be combined with clinical observations, patient history, and  epidemiological information. The expected result is Negative.  Fact Sheet for Patients:  EntrepreneurPulse.com.au  Fact  Sheet for Healthcare Providers:  IncredibleEmployment.be  This test is no t yet approved or cleared by the [place] FDA and  has been authorized for detection and/or diagnosis of SARS-CoV-2 by FDA under an Emergency Use Authorization (EUA). This EUA will remain  in effect (meaning this test can be used) for the duration of the COVID-19 declaration under Section 564(b)(1) of the Act, 21 U.S.C.section 360bbb-3(b)(1), unless the authorization is terminated  or revoked sooner.       Influenza A by PCR NEGATIVE NEGATIVE Final   Influenza B by PCR NEGATIVE NEGATIVE Final    Comment: (NOTE) The Xpert Xpress SARS-CoV-2/FLU/RSV plus assay is intended as an aid in the diagnosis of influenza from Nasopharyngeal swab specimens and should not be used as a sole basis for treatment. Nasal washings and aspirates are unacceptable for Xpert Xpress SARS-CoV-2/FLU/RSV testing.  Fact Sheet for Patients: EntrepreneurPulse.com.au  Fact Sheet for Healthcare Providers: IncredibleEmployment.be  This test is not yet approved or cleared by the [place] FDA and has been authorized for detection and/or diagnosis of SARS-CoV-2 by FDA under an Emergency Use Authorization (EUA). This EUA will remain in effect (meaning this test can be used) for the duration of the COVID-19 declaration under Section 564(b)(1) of the Act, 21 U.S.C. section 360bbb-3(b)(1), unless the authorization is terminated or revoked.  Performed at [facility], [place] [address], [place], [place] [address]   Surgical PCR screen     Status: Abnormal   Collection Time: 12/04/21 11:33 AM   Specimen: Nasal Mucosa; Nasal Swab  Result Value Ref Range Status   MRSA, PCR POSITIVE (A) NEGATIVE Final    Comment: RESULT CALLED TO, READ BACK  BY AND VERIFIED WITH: RN Ray Church [phone] AT 1448 BY CM    Staphylococcus aureus POSITIVE (A) NEGATIVE Final    Comment: (NOTE) The Xpert SA Assay (FDA approved for NASAL specimens in patients 37 years of age and older), is one component of a comprehensive surveillance program. It is not intended to diagnose infection nor to guide or monitor treatment. Performed at [facility], [place] [address] [place], [place] [address]   Aerobic/Anaerobic Culture w Gram Stain (surgical/deep wound)     Status: None (Preliminary result)   Collection Time: 12/04/21 12:54 PM   Specimen: PATH Other; Tissue  Result Value Ref Range Status   Specimen Description FOOT  Final   Special Requests RIGHT FOOT AMPUTATION STUMP SPEC A  Final   Gram Stain NO WBC SEEN NO ORGANISMS SEEN   Final   Culture   Final    NO GROWTH < 24 HOURS Performed at [facility], [place] [address] [place], [place] [address]    Report Status PENDING  Incomplete     Medications:    amLODipine  10 mg Oral Daily   atorvastatin  40 mg Oral QPM   Chlorhexidine Gluconate Cloth  6 each Topical Daily   insulin aspart  0-15 Units Subcutaneous TID WC   insulin aspart  0-5 Units Subcutaneous QHS   lisinopril  40 mg Oral Daily   mirtazapine  7.5 mg Oral QHS   morphine  30 mg Oral Q12H   mupirocin ointment  1 application Nasal BID   sodium chloride flush  3 mL Intravenous Q12H   Continuous Infusions:  sodium chloride     ceFEPime (MAXIPIME) IV 2 g (12/06/21 0121)   vancomycin 1,500 mg (12/06/21 0559)      LOS: 3 days  Charlynne Cousins  [facility]  12/06/2021, 11:51 AM

## 2021-12-06 NOTE — Consult Note (Signed)
[facility] Face-to-Face Psychiatry Consult follow up  Reason for Consult:  "SI" Referring Physician:  Neila Gear, NP Patient Identification: Eugene Diaz MRN:  [id number] Principal Diagnosis: Gangrene associated with type 2 diabetes mellitus ([place]) Diagnosis:  Principal Problem:   Gangrene right third toe associated with type 2 diabetes mellitus ([place]), likely osteomyelitis Active Problems:   Hypertension associated with type 2 diabetes mellitus ([place])   Hyperlipidemia associated with type 2 diabetes mellitus ([place])   Diabetic neuropathy ([place])   GERD (gastroesophageal reflux disease)   Anemia, chronic disease   Type 2 diabetes mellitus ([place])   Total Time spent with patient: 45 minutes  HPI  Assessment: Eugene Diaz is a 56 y.o. male patient with no reported psychiatric history who is admitted with gangrene, osteomyelitis. Patient s/p metatarsal amputation on 12/04/21. Per chart review, patient reported thoughts of wanting to harm himself and reported that he owns lots of guns. Psychiatry was consulted for SI. Patient reports feeling depressed as well as high anxiety. He reports associated depressive sx of decreased appetite associated with weight loss of ~20 lbs ,poor sleep, anhedonia, hopelessness. Patient had reported that he had not eaten in ~ 4 days prior to coming to the hospital d/t decreased appetite.He is amenable to trial of remeron for mood , anxiety, sleep, decreased appetite. Patient is also agreeable to chaplain consult.  12/06/21- Patient reports improved sleep.  Still endorses depression and anxiety and still grieving about his wife's death.  Denies SI, HI, AVH.  Contracts for safety.  Safety planning done with patient.  Remeron helping with sleep. Discussed voluntary removal of guns which pt was not interested in. Would be able to call 911 if he developed worsening suicidal thoughts.  Consider chaplain consult. Recommend outpatient follow-up with psychiatry for medication  management and therapy.    Recommendations -Continue remeron 7.5 mg qhs  - chaplain consult -patient would benefit from outpatient therapy and medication management upon discharge if he is agreeable -patient denies SI today; contracts for safety.  Safety planning done with patient.  -continue PRN xanax as ordered--ultimately patient would benefit from tapering this medication; however, that would be most appropriately managed by outpatient provider as benzodiazepine tapers can be a lengthy process.   Thank you for this psych consult.  Psychiatry will sign off.  Recommendations communicated to Dr.Feliz Olevia Bowens via epic secure chat.    Disposition: per primary  Patient report 12/06/21- Pt recalls meeting Dr. Serafina Mitchell, felt like the meeting went well. Discussed his depressive sx; pt becomes tearful about his wife passing away (suddenly - he found pt on the couch) and current health struggles. Feels overwhelmed. Had been together for 32 years. His health started declining after he passed; feels like he is being punished by God. He has 2 stepkids and 6 grandkids. He sees his stepkids occasionally. They live nearby in [place]. Last saw them 3-4 months ago. Has been feeling depressed since his wife passed. Lives by himself - has some neighbors but not many close friends (all of his friends work and wife was hub of social life). Patient responded well to mirtazapine for sleep (first time he had slept in a couple of weeks). Having some ongoing pain at site of amputation but much less than prior to surgery. Able to take care of himself at home to an extent, uses a wheelchair and crutches. Generally future oriented throughout exam although has some difficulty thinking about how he could care for himself. Has no active SI but chronic passive  SI since the death of his wife. No HI, AH/VH. Pt is quite religious but not engaged in a community of faith at present (didn't go to church when he was drinking because he felt it  made him a hypocrite). Hopes his wife is proud of him (from [place]) for quitting drinking. Would like to go to church but has transportation difficulties and is generally unwilling to ask for help. When asked about guns at home, pt responds "who don't". Does not believe in using guns for violence, has them for self defense (appears to be cultural belief). Guns are stored in a safe (locked up bc stepson had stolen guns from him in the past). Discussed voluntary removal of guns which pt was not interested in. Would be able to call 911 if he developed worsening suicidal thoughts. States "I love myself" and "I am ready to go and try to start life again". Discussed that mirtazapien helping with sleep good early sign for efficacy and followup options.   Modified SA risk factors: on antidepressant, insomnia (independent risk factor) responding to remeron.   Past Psychiatric History: Previous Medication Trials: xanax Previous Psychiatric Hospitalizations: denies Previous Suicide Attempts: yes, states that he cut his arm when he was a teenager as a SA; describes cuts as superficial, did not go to the hospital; did not receive psychiatric or medical treatment History of Violence: no Outpatient psychiatrist: no  Social History: Marital Status: widower Children: 2 step kids-reports occasional contact  Housing Status: alone; reports that he is [place] in the woods  Easy access to gun: "I got a couple"  Substance Use (with emphasis over the last 12 months) Recreational Drugs: denies Use of Alcohol: "I quit" patient reports being sober for ~7-8 months Rehab History: denies   Family Psychiatric History: denies   HPI per primary:   56 year old male with past medical history of diabetes, previous foot/toe amputations including left BKA, right first toe.  Presents with gangrenous right third toe.  Reports has been painful for some time, "looking pretty bad" and he went to podiatry outpatient today for wound  check, after having been treated in the ED last week and noting worsening of the toe.  Podiatry advised he go to the ED, likely would need admission for IV antibiotics and possible surgical amputation of the third digit.  MRI, ABIs ordered.     ED course: Mild hypertension up to 947 systolic, 97 diastolic, patient not on home medications.  White cell count within normal limits, mild low hemoglobin of 11.8, mild low sodium 134, mild low potassium 3.4, glucose controlled at 152, blood cultures obtained, x-ray right foot shows concerning for osteomyelitis.  MRI right foot shows osteomyelitis of right third toe.  Podiatrist consulted.  Patient started on IV cefepime and vancomycin.  [facility] hospitalist consulted for admission for further evaluation and management  Past Psychiatric History: none reported  Risk to Self:  no Risk to Others:  no Prior Inpatient Therapy:  no Prior Outpatient Therapy:  no Past Medical History:  Past Medical History:  Diagnosis Date   Alcohol abuse    Alcoholic peripheral neuropathy ([place])    Anxiety    Arthritis    Asthma    followed by pcp   B12 deficiency    Depression    Diabetic neuropathy ([place])    Edema of both lower extremities    Essential tremor    neurologist-- dr patel--- due to alcohol abuse   GERD (gastroesophageal reflux disease)    History  of cellulitis 2020   left lower leg;   recurrent 03-25-2020   History of esophageal stricture    s/p  dilatation 02-13-2018   History of osteomyelitis 2020   left great toe     Hypercholesterolemia    Hypertension    followed by pcp  (nuclear stress test 03-11-2014 low risk w/ no ischemia, ef 65%   Normocytic anemia    followed by pcp   (03-18-2020 had transfusion's 02/ 2021)   Osteomyelitis of great toe of right foot ([place]) 08/18/2021   Peripheral vascular disease ([place])    Severe depression ([place]) 06/09/2021   Status post incision and drainage followed by Dr [name] and pcp   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   (03-17-2020  pt states most of incision is healed with exception an area that is still draining,  daily dressing change),  foot is red but not warm to the touch and has swelling but has improved)   Suicidal ideation 06/09/2021   Thrombocytopenia ([place])    chronic   Type 2 diabetes mellitus ([place])    followed by pcp---    (03-18-2020  pt stated does not check blood sugar)    Past Surgical History:  Procedure Laterality Date   AMPUTATION Left 10/16/2019   Procedure: Left partial second ray resection; placement of antibiotic beads;  Surgeon: Evelina Bucy, DPM;  [facility]: [facility];  Service: Podiatry;  Laterality: Left;   AMPUTATION Left 11/13/2019   Procedure: Left Midfoot Amputation - Transmetatarsal vs. Lisfranc; Placement antibiotic beads;  Surgeon: Evelina Bucy, DPM;  [facility];  Service: Podiatry;  Laterality: Left;   AMPUTATION Left 12/21/2019   Procedure: Chopart Amputation left foot;  Surgeon: Evelina Bucy, DPM;  Location: [place];  Service: Podiatry;  Laterality: Left;   AMPUTATION Left 06/24/2020   Procedure: LEFT BELOW KNEE AMPUTATION;  Surgeon: Newt Minion, MD;  Location: [place];  Service: Orthopedics;  Laterality: Left;   AMPUTATION Right 03/29/2021   Procedure: AMPUTATION RAY;  Surgeon: Evelina Bucy, DPM;  Location: [place];  Service: Podiatry;  Laterality: Right;   AMPUTATION TOE Left 08/14/2019   Procedure: AMPUTATION GREAT TOE;  Surgeon: Evelina Bucy, DPM;  [facility];  Service: Podiatry;  Laterality: Left;   APPLICATION OF WOUND VAC Left 12/21/2019   Procedure: Application Of Wound Vac;  Surgeon: Evelina Bucy, DPM;  Location: [place];  Service: Podiatry;  Laterality: Left;   BIOPSY  02/13/2018   Procedure: BIOPSY;  Surgeon: Danie Binder, MD;  [facility];  Service: Endoscopy;;  transverse colon biopsy, gastric biopsy   COLONOSCOPY WITH PROPOFOL N/A 02/13/2018   Procedure: COLONOSCOPY WITH PROPOFOL;  Surgeon: Danie Binder,  MD;  [facility];  Service: Endoscopy;  Laterality: N/A;  11:15am   ESOPHAGOGASTRODUODENOSCOPY (EGD) WITH PROPOFOL N/A 02/13/2018   Procedure: ESOPHAGOGASTRODUODENOSCOPY (EGD) WITH PROPOFOL;  Surgeon: Danie Binder, MD;  [facility];  Service: Endoscopy;  Laterality: N/A;   I & D EXTREMITY Left 07/16/2019   Procedure: Insicion  AND DEBRIDEMENT EXTREMITY;  Surgeon: Evelina Bucy, DPM;  Location: [place];  Service: Podiatry;  Laterality: Left;   I & D EXTREMITY Left 06/22/2020   Procedure: IRRIGATION AND DEBRIDEMENT EXTREMITY, left foot and ankle;  Surgeon: Evelina Bucy, DPM;  Location: [place];  Service: Podiatry;  Laterality: Left;   I & D EXTREMITY Right 03/29/2021   Procedure: IRRIGATION AND DEBRIDEMENT EXTREMITY;  Surgeon: [name],  Christian Mate, DPM;  Location: [place];  Service: Podiatry;  Laterality: Right;   INCISION AND DRAINAGE OF WOUND Left 10/13/2019   Procedure: IRRIGATION AND DEBRIDEMENT WOUND OF LEFT FOOT AND FIRST METATARSAL RESECTION;  Surgeon: Evelina Bucy, DPM;  [facility];  Service: Podiatry;  Laterality: Left;   IRRIGATION AND DEBRIDEMENT FOOT Left 11/11/2019   Procedure: Left Foot Wound Irrigation and Debridement;  Surgeon: Evelina Bucy, DPM;  [facility];  Service: Podiatry;  Laterality: Left;   Left arm     Left arm repair (tendon and artery)   PERCUTANEOUS PINNING  07/16/2019   Procedure: Open Reduction Percutaneous Pinning Extremity;  Surgeon: Evelina Bucy, DPM;  Location: [place];  Service: Podiatry;;   PILONIDAL CYST EXCISION N/A 08/08/2014   Procedure: CYST EXCISION PILONIDAL EXTENSIVE;  Surgeon: Jamesetta So, MD;  [facility];  Service: General;  Laterality: N/A;   POLYPECTOMY  02/13/2018   Procedure: POLYPECTOMY;  Surgeon: Danie Binder, MD;  [facility];  Service: Endoscopy;;  transverse colon polyp hs, rectal polyps times 2   SAVORY DILATION N/A 02/13/2018   Procedure: SAVORY DILATION;  Surgeon: Danie Binder, MD;  [facility];  Service: Endoscopy;  Laterality: N/A;   TENDON LENGTHENING Right 12/04/2021   Procedure: TENDON LENGTHENING;  Surgeon: Landis Martins, DPM;  Location: [place];  Service: Podiatry;  Laterality: Right;   TRANSMETATARSAL AMPUTATION Right 12/04/2021   Procedure: TRANSMETATARSAL AMPUTATION;  Surgeon: Landis Martins, DPM;  Location: [place];  Service: Podiatry;  Laterality: Right;   WOUND DEBRIDEMENT Left 09/04/2019   Procedure: DEBRIDEMENT WOUND WITH COMPLEX  REPAIR OF DEHISCENCE;  Surgeon: Evelina Bucy, DPM;  Location: [place];  Service: Podiatry;  Laterality: Left;  Leave patient on stretcher   WOUND DEBRIDEMENT Left 10/16/2019   Procedure: Left foot wound debridement and closure;  Surgeon: Evelina Bucy, DPM;  [facility];  Service: Podiatry;  Laterality: Left;   WOUND DEBRIDEMENT Left 12/18/2019   Procedure: LEFT FOOT DEBRIDEMENT WITH PARTIAL INCISION OF INFECTED BONE;  Surgeon: Evelina Bucy, DPM;  Location: [place];  Service: Podiatry;  Laterality: Left;  LEFT FOOT DEBRIDEMENT WITH PARTIAL INCISION OF INFECTED BONE   WOUND DEBRIDEMENT Right 04/01/2021   Procedure: Right foot wound debridement and irrigation and closure, bone biopsy;  Surgeon: Evelina Bucy, DPM;  Location: [place];  Service: Podiatry;  Laterality: Right;   Family History:  Family History  Problem Relation Age of Onset   Heart attack Mother        Living, 14   Cancer Father        Deceased   Healthy Brother    Healthy Sister    Colon cancer Neg Hx    Colon polyps Neg Hx    Family Psychiatric  History:denies Social History:  Social History   Substance and Sexual Activity  Alcohol Use Not Currently   Alcohol/week: 50.0 standard drinks   Types: 50 Standard drinks or equivalent per week   Comment: Drinks 6 beers, liquor 4-6oz (both daily) x 35 years     Social History   Substance and Sexual Activity  Drug Use Not Currently   Types: Marijuana    Social  History   Socioeconomic History   Marital status: Widowed    Spouse name: Not on file   Number of children: 0   Years of education: Not on file   Highest education level: 8th grade  Occupational  History   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  Tobacco Use   Smoking status: Former    Packs/day: 2.50    Years: 25.00    Pack years: 62.50    Types: Cigarettes    Quit date: 08/05/1984    Years since quitting: 37.3   Smokeless tobacco: Never  Vaping Use   Vaping Use: Never used  Substance and Sexual Activity   Alcohol use: Not Currently    Alcohol/week: 50.0 standard drinks    Types: 50 Standard drinks or equivalent per week    Comment: Drinks 6 beers, liquor 4-6oz (both daily) x 35 years   Drug use: Not Currently    Types: Marijuana   Sexual activity: Yes    Birth control/protection: None  Other Topics Concern   Not on file  Social History Narrative   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 Highest level of education:  8th grade   He lives with wife.  No children.   Left handed   Drinks soda, water-no coffee, no tea   Social Determinants of Health   Financial Resource Strain: Not on file  Food Insecurity: Not on file  Transportation Needs: Not on file  Physical Activity: Not on file  Stress: Not on file  Social Connections: Not on file   Additional Social History:    Allergies:  No Known Allergies  Labs:  Results for orders placed or performed during the hospital encounter of 12/03/21 (from the past 48 hour(s))  Glucose, capillary     Status: Abnormal   Collection Time: 12/04/21  4:51 PM  Result Value Ref Range   Glucose-Capillary 130 (H) 70 - 99 mg/dL    Comment: Glucose reference range applies only to samples taken after fasting for at least 8 hours.  Glucose, capillary     Status: Abnormal   Collection Time: 12/04/21  9:28 PM  Result Value Ref Range   Glucose-Capillary 139 (H) 70 - 99  mg/dL    Comment: Glucose reference range applies only to samples taken after fasting for at least 8 hours.  CBC     Status: Abnormal   Collection Time: 12/05/21  3:11 AM  Result Value Ref Range   WBC 8.7 4.0 - 10.5 K/uL   RBC 3.30 (L) 4.22 - 5.81 MIL/uL   Hemoglobin 9.6 (L) 13.0 - 17.0 g/dL   HCT 29.2 (L) 39.0 - 52.0 %   MCV 88.5 80.0 - 100.0 fL   MCH 29.1 26.0 - 34.0 pg   MCHC 32.9 30.0 - 36.0 g/dL   RDW 13.9 11.5 - 15.5 %   Platelets 289 150 - 400 K/uL   nRBC 0.0 0.0 - 0.2 %    Comment: Performed at [facility], [place] [address], [place], [place] [address]  Basic metabolic panel     Status: Abnormal   Collection Time: 12/05/21  3:11 AM  Result Value Ref Range   Sodium 136 135 - 145 mmol/L   Potassium 4.4 3.5 - 5.1 mmol/L    Comment: DELTA CHECK NOTED NO VISIBLE HEMOLYSIS    Chloride 108 98 - 111 mmol/L   CO2 21 (L) 22 - 32 mmol/L   Glucose, Bld 115 (H) 70 - 99 mg/dL    Comment: Glucose reference range applies only to samples taken after fasting for at least 8 hours.   BUN 13 6 - 20 mg/dL   Creatinine, Ser 0.89 0.61 - 1.24 mg/dL  Calcium 9.0 8.9 - 10.3 mg/dL   GFR, Estimated >60 >60 mL/min    Comment: (NOTE) Calculated using the CKD-EPI Creatinine Equation (2021)    Anion gap 7 5 - 15    Comment: Performed at [facility], [place] [address], [place], [place] [address]  Glucose, capillary     Status: Abnormal   Collection Time: 12/05/21  7:42 AM  Result Value Ref Range   Glucose-Capillary 106 (H) 70 - 99 mg/dL    Comment: Glucose reference range applies only to samples taken after fasting for at least 8 hours.  Glucose, capillary     Status: Abnormal   Collection Time: 12/05/21 12:38 PM  Result Value Ref Range   Glucose-Capillary 132 (H) 70 - 99 mg/dL    Comment: Glucose reference range applies only to samples taken after fasting for at least 8 hours.  Glucose, capillary     Status: Abnormal   Collection Time: 12/05/21  4:37 PM  Result Value Ref Range    Glucose-Capillary 109 (H) 70 - 99 mg/dL    Comment: Glucose reference range applies only to samples taken after fasting for at least 8 hours.  Glucose, capillary     Status: Abnormal   Collection Time: 12/05/21  8:32 PM  Result Value Ref Range   Glucose-Capillary 140 (H) 70 - 99 mg/dL    Comment: Glucose reference range applies only to samples taken after fasting for at least 8 hours.  CBC     Status: Abnormal   Collection Time: 12/06/21  3:32 AM  Result Value Ref Range   WBC 7.4 4.0 - 10.5 K/uL   RBC 3.40 (L) 4.22 - 5.81 MIL/uL   Hemoglobin 9.8 (L) 13.0 - 17.0 g/dL   HCT 30.1 (L) 39.0 - 52.0 %   MCV 88.5 80.0 - 100.0 fL   MCH 28.8 26.0 - 34.0 pg   MCHC 32.6 30.0 - 36.0 g/dL   RDW 14.1 11.5 - 15.5 %   Platelets 281 150 - 400 K/uL   nRBC 0.0 0.0 - 0.2 %    Comment: Performed at [facility], [place] [address], [place], [place] [address]  Glucose, capillary     Status: Abnormal   Collection Time: 12/06/21  8:05 AM  Result Value Ref Range   Glucose-Capillary 100 (H) 70 - 99 mg/dL    Comment: Glucose reference range applies only to samples taken after fasting for at least 8 hours.  Glucose, capillary     Status: Abnormal   Collection Time: 12/06/21 11:18 AM  Result Value Ref Range   Glucose-Capillary 117 (H) 70 - 99 mg/dL    Comment: Glucose reference range applies only to samples taken after fasting for at least 8 hours.  Glucose, capillary     Status: Abnormal   Collection Time: 12/06/21  4:12 PM  Result Value Ref Range   Glucose-Capillary 172 (H) 70 - 99 mg/dL    Comment: Glucose reference range applies only to samples taken after fasting for at least 8 hours.    Current Facility-Administered Medications  Medication Dose Route Frequency Provider Last Rate Last Admin   0.9 %  sodium chloride infusion  250 mL Intravenous PRN Emeterio Reeve, DO       acetaminophen (TYLENOL) tablet 650 mg  650 mg Oral Q6H PRN Pahwani, Rinka R, MD   650 mg at 12/04/21 1710   ALPRAZolam  (XANAX) tablet 0.5 mg  0.5 mg Oral QID PRN Emeterio Reeve, DO  0.5 mg at 12/05/21 2153   amLODipine (NORVASC) tablet 10 mg  10 mg Oral Daily Emeterio Reeve, DO   10 mg at 12/05/21 1013   atorvastatin (LIPITOR) tablet 40 mg  40 mg Oral QPM Emeterio Reeve, DO   40 mg at 12/05/21 1730   Chlorhexidine Gluconate Cloth 2 % PADS 6 each  6 each Topical Daily Darliss Cheney, MD   6 each at 12/06/21 1000   enoxaparin (LOVENOX) injection 40 mg  40 mg Subcutaneous Q24H Charlynne Cousins, MD   40 mg at 12/06/21 1428   insulin aspart (novoLOG) injection 0-15 Units  0-15 Units Subcutaneous TID WC Emeterio Reeve, DO   2 Units at 12/05/21 1337   insulin aspart (novoLOG) injection 0-5 Units  0-5 Units Subcutaneous QHS Emeterio Reeve, DO       lisinopril (ZESTRIL) tablet 40 mg  40 mg Oral Daily Emeterio Reeve, DO   40 mg at 12/06/21 1000   mirtazapine (REMERON) tablet 7.5 mg  7.5 mg Oral QHS Ival Bible, MD   7.5 mg at 12/05/21 2153   morphine (MS CONTIN) 12 hr tablet 30 mg  30 mg Oral Q12H Emeterio Reeve, DO   30 mg at 12/06/21 8938   morphine (PF) 2 MG/ML injection 2 mg  2 mg Intravenous Q2H PRN Emeterio Reeve, DO   2 mg at 12/06/21 1017   mupirocin ointment (BACTROBAN) 2 % 1 application  1 application Nasal BID Emeterio Reeve, DO   1 application at 51/02/58 1000   sodium chloride flush (NS) 0.9 % injection 3 mL  3 mL Intravenous Q12H Emeterio Reeve, DO   3 mL at 12/05/21 2154   sodium chloride flush (NS) 0.9 % injection 3 mL  3 mL Intravenous PRN Emeterio Reeve, DO       sulfamethoxazole-trimethoprim (BACTRIM DS) 800-160 MG per tablet 1 tablet  1 tablet Oral Q12H Charlynne Cousins, MD   1 tablet at 12/06/21 1217    Musculoskeletal: Strength & Muscle Tone:  did not formally assess; patient laying in bed [place]:  did not formally assess; patient laying in bed Patient leans: N/A            Psychiatric Specialty Exam:  Presentation   General Appearance: Appropriate for Environment; Casual  Eye Contact:Good  Speech:Clear and Coherent; Normal Rate  Speech Volume:Normal  Handedness:No data recorded  Mood and Affect  Mood:Depressed; Anxious  Affect:Appropriate; Constricted; Tearful   Thought Process  Thought Processes:Coherent; Goal Directed  Descriptions of Associations:Intact  Orientation:Full (Time, Place and Person)  Thought Content:Rumination  History of Schizophrenia/Schizoaffective disorder:No data recorded Duration of Psychotic Symptoms:No data recorded Hallucinations:Hallucinations: None  Ideas of Reference:None  Suicidal Thoughts:Suicidal Thoughts: No  Homicidal Thoughts:Homicidal Thoughts: No   Sensorium  Memory:Immediate Good; Recent Good; Remote Good  Judgment:Fair  Insight:Fair   Executive Functions  Concentration:Good Attention Span:Good  [place] of Knowledge:Good  Language:Good   Psychomotor Activity  Psychomotor Activity:Psychomotor Activity: Normal   Assets  Assets:Communication Skills; Desire for Improvement; Resilience; Housing   Sleep  Sleep:Sleep: Good   Physical Exam: Physical Exam Constitutional:      Appearance: Normal appearance. He is normal weight.  HENT:     Head: Normocephalic and atraumatic.  Eyes:     Extraocular Movements: Extraocular movements intact.  Pulmonary:     Effort: Pulmonary effort is normal.  Musculoskeletal:     Comments: L BKA R foot bandaged from recent surgery  Neurological:     General: No  focal deficit present.     Mental Status: He is alert.   Review of Systems  Psychiatric/Behavioral:  Positive for depression. Negative for hallucinations, substance abuse and suicidal ideas. The patient is nervous/anxious. The patient does not have insomnia.   Blood pressure 138/81, pulse 70, temperature 98 F (36.7 C), temperature source Oral, resp. rate 19, height 6' 2.02" (1.88 m), weight 127 kg, SpO2 98 %. Body  mass index is 35.93 kg/m.  Treatment Plan Summary: Armando Reichert, MD 12/06/2021 4:38 PM

## 2021-12-07 ENCOUNTER — Other Ambulatory Visit (HOSPITAL_COMMUNITY): Payer: Self-pay

## 2021-12-07 DIAGNOSIS — E1141 Type 2 diabetes mellitus with diabetic mononeuropathy: Secondary | ICD-10-CM | POA: Diagnosis not present

## 2021-12-07 DIAGNOSIS — D638 Anemia in other chronic diseases classified elsewhere: Secondary | ICD-10-CM | POA: Diagnosis not present

## 2021-12-07 DIAGNOSIS — M869 Osteomyelitis, unspecified: Secondary | ICD-10-CM | POA: Diagnosis not present

## 2021-12-07 DIAGNOSIS — E1152 Type 2 diabetes mellitus with diabetic peripheral angiopathy with gangrene: Secondary | ICD-10-CM | POA: Diagnosis not present

## 2021-12-07 LAB — GLUCOSE, CAPILLARY
Glucose-Capillary: 108 mg/dL — ABNORMAL HIGH (ref 70–99)
Glucose-Capillary: 110 mg/dL — ABNORMAL HIGH (ref 70–99)

## 2021-12-07 LAB — SURGICAL PATHOLOGY

## 2021-12-07 MED ORDER — MIRTAZAPINE 7.5 MG PO TABS
7.5000 mg | ORAL_TABLET | Freq: Every day | ORAL | 3 refills | Status: DC
  Filled 2021-12-07: qty 30, 30d supply, fill #0

## 2021-12-07 MED ORDER — SULFAMETHOXAZOLE-TRIMETHOPRIM 800-160 MG PO TABS
1.0000 | ORAL_TABLET | Freq: Two times a day (BID) | ORAL | 0 refills | Status: AC
  Filled 2021-12-07: qty 28, 14d supply, fill #0

## 2021-12-07 NOTE — TOC Transition Note (Signed)
Transition of Care [facility]) - CM/SW Discharge Note   Patient Details  Name: Eugene Diaz MRN: [id number] Date of Birth: 1966/08/28  Transition of Care 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 RN, patient, and patient's family notified of DC. From home alone. States support family who will assist if needed. Orders noted for home health services, PT/OT. Pt agreeable. Choice offered. Pt without preference. Referral made with [facility] and accepted. Pt without DME needs. No Rx med concerns. [facility] to deliver Rx meds to bedside prior to d/c. Family to provide transportation home.    RNCM will sign off for now as intervention is no longer needed. Please consult [place] again if new needs arise.    Final next level of care: Home w Home Health Services Barriers to Discharge: No Barriers Identified   Patient Goals and CMS Choice     Choice offered to / list presented to : Patient  Discharge Placement                       Discharge Plan and Services                          HH Arranged: PT, OT [facility] Agency: [facility] Date [place]: 12/07/21 Time [place]: 1107 Representative spoke with at [place]: [place] ([place]) Interventions     Readmission Risk Interventions Readmission Risk Prevention Plan 06/25/2020  Transportation Screening Complete  Medication Review Press photographer) Complete  PCP or Specialist appointment within 3-5 days of discharge Complete  HRI or Home Care Consult Complete  SW Recovery Care/Counseling Consult Complete  Palliative Care Screening Complete  Skilled Nursing Facility  Complete  Some recent data might be hidden

## 2021-12-07 NOTE — Discharge Summary (Signed)
Physician Discharge Summary  Eugene Diaz FUX:[id number] DOB: Sep 14, 1966 DOA: 12/03/2021  PCP: [name], PA-C  Admit date: 12/03/2021 Discharge date: 12/07/2021  Admitted From: Home Disposition:  Home  Recommendations for Outpatient Follow-up:  Follow up with PCP in 1-2 weeks Please obtain BMP/CBC in one week   Home Health:Yes Equipment/Devices:None  Discharge Condition:Stable CODE STATUS:Full Diet recommendation: Heart Healthy  Brief/Interim Summary: 56 y.o. male past medical history of diabetes mellitus, previous toe amputation including a left below the knee and right first toe comes in with gangrenous right third toe MRI of the foot showed right third toe osteomyelitis podiatry was consulted started on IV Vanco and cefepime he is status post transmetatarsal amputation by podiatrist on 12/04/2021.  Discharge Diagnoses:  Principal Problem:   Gangrene right third toe associated with type 2 diabetes mellitus ([place]), likely osteomyelitis Active Problems:   Hypertension associated with type 2 diabetes mellitus ([place])   Hyperlipidemia associated with type 2 diabetes mellitus (HCC)   Diabetic neuropathy (HCC)   GERD (gastroesophageal reflux disease)   Anemia, chronic disease   Type 2 diabetes mellitus (HCC)  Gangrene of right third toe associated with diabetes mellitus: Podiatrist was consulted he status post transmetatarsal amputation on 12/04/2021. On admission he was started on IV Vanco and cefepime. [name] was done that showed no obstruction. Intraoperative cultures remain negative. He was sent home on Bactrim for 2 weeks he will follow-up with podiatrist in 1 week. Physical therapy evaluated the patient and recommended home health PT.  Essential hypertension: No change made to his medication.  Hyperlipidemia/diabetes mellitus type 2: Continue statins no changes made to his insulin regimen he will follow-up with PCP in 2 weeks titrate as tolerated.  Anemia of chronic  disease: Hemoglobin remained relatively stable.  Hypokalemia: Replete orally now resolved.  Major depressive disorder: Evaluated by psych no suicidal ideation they recommended Remeron.  Discharge Instructions  Discharge Instructions     Diet - low sodium heart healthy   Complete by: As directed    Discharge wound care:   Complete by: As directed    Will need to follow-up with podiatry as an outpatient for dressing changes.   Increase activity slowly   Complete by: As directed       Allergies as of 12/07/2021   No Known Allergies      Medication List     STOP taking these medications    amoxicillin-clavulanate 875-125 MG tablet Commonly known as: AUGMENTIN       TAKE these medications    acetaminophen 325 MG tablet Commonly known as: TYLENOL Take 2 tablets (650 mg total) by mouth every 6 (six) hours as needed for mild pain (or Fever >/= 101).   ALPRAZolam 1 MG tablet Commonly known as: XANAX Take 0.5 tablets (0.5 mg total) by mouth 4 (four) times daily as needed for anxiety.   amLODipine 10 MG tablet Commonly known as: NORVASC Take 1 tablet (10 mg total) by mouth daily.   aspirin 81 MG EC tablet Take 1 tablet (81 mg total) by mouth daily. Swallow whole.   atorvastatin 40 MG tablet Commonly known as: LIPITOR Take 40 mg by mouth every evening.   diclofenac Sodium 1 % Gel Commonly known as: VOLTAREN Apply 2 g topically daily as needed (pain).   ferrous sulfate 325 (65 FE) MG tablet Take 1 tablet (325 mg total) by mouth daily.   folic acid 1 MG tablet Commonly known as: FOLVITE Take 1 tablet (1 mg total) by mouth  daily.   gabapentin 300 MG capsule Commonly known as: NEURONTIN TAKE 3 CAPSULES BY MOUTH IN THE MORNING, 3 CAPSULES AT NOON, AND 4 CAPSULES AT BEDTIME FOR NERVE PAIN.   glipiZIDE 5 MG tablet Commonly known as: GLUCOTROL Take 1 tablet (5 mg total) by mouth 2 (two) times daily.   hydrALAZINE 50 MG tablet Commonly known as:  APRESOLINE Take 1 tablet (50 mg total) by mouth every 8 (eight) hours.   lidocaine 5 % ointment Commonly known as: XYLOCAINE Apply 1 application topically 2 (two) times daily as needed for mild pain.   lisinopril 40 MG tablet Commonly known as: ZESTRIL Take 1 tablet (40 mg total) by mouth at bedtime. What changed: when to take this   metFORMIN 500 MG tablet Commonly known as: Glucophage Take 1 tablet (500 mg total) by mouth 2 (two) times daily with a meal.   morphine 30 MG 12 hr tablet Commonly known as: MS CONTIN Take 30 mg by mouth 2 (two) times daily as needed for pain.   oxycodone 30 MG immediate release tablet Commonly known as: ROXICODONE Take 30 mg by mouth 4 (four) times daily as needed for pain.   silver sulfADIAZINE 1 % cream Commonly known as: Silvadene Apply pea-sized amount to wound daily. What changed:  how much to take when to take this   sulfamethoxazole-trimethoprim 800-160 MG tablet Commonly known as: BACTRIM DS Take 1 tablet by mouth every 12 (twelve) hours for 14 days.               Discharge Care Instructions  (From admission, onward)           Start     Ordered   12/07/21 0000  Discharge wound care:       Comments: Will need to follow-up with podiatry as an outpatient for dressing changes.   12/07/21 1058            No Known Allergies  Consultations: Podiatrist   Procedures/Studies: MR FOOT RIGHT W WO CONTRAST  Result Date: 12/03/2021 CLINICAL DATA:  Gangrene of the right third toe. EXAM: MRI OF THE RIGHT FOREFOOT WITHOUT AND WITH CONTRAST TECHNIQUE: Multiplanar, multisequence MR imaging of the right forefoot was performed before and after the administration of intravenous contrast. CONTRAST:  6mL GADAVIST GADOBUTROL 1 MMOL/ML IV SOLN COMPARISON:  Radiography same day FINDINGS: Study is degraded by patient motion. There has been distant amputation of the great toe at the proximal metatarsal level. No complicating feature seen  in that location. Second toe appears negative for osteomyelitis. There are some degenerative changes at the metatarsal phalangeal joint. Third toe shows abnormal edema and enhancement of the phalangeal consistent with osteomyelitis. No abnormal signal seen affecting the metatarsal head or more proximal metatarsal. Fourth and fifth toes are negative. No evidence of soft tissue fluid collection. IMPRESSION: Abnormal edema and enhancement of the phalangeal of the third toe consistent with osteomyelitis. No abnormal signal seen affecting the third toe metatarsal. See above for full discussion. Electronically Signed   By: Nelson Chimes M.D.   On: 12/03/2021 21:59   DG Knee Complete 4 Views Right  Result Date: 11/08/2021 CLINICAL DATA:  Acute right knee pain. EXAM: RIGHT KNEE - COMPLETE 4+ VIEW COMPARISON:  None. FINDINGS: No evidence of fracture, dislocation, or joint effusion. No evidence of arthropathy or other focal bone abnormality. Soft tissues are unremarkable. IMPRESSION: Negative. Electronically Signed   By: Marijo Conception M.D.   On: 11/08/2021 10:04   DG Foot  2 Views Right  Result Date: 12/04/2021 CLINICAL DATA:  Postoperative right foot surgery for gangrene of the right third toe. EXAM: RIGHT FOOT - 2 VIEW COMPARISON:  12/03/2021 FINDINGS: Interval postoperative changes with amputation of the right forefoot at the proximal/mid metatarsal level. Bone resection margins appear intact. Skin clips and soft tissue gas are likely related to recent surgery. Degenerative changes are noted in the interphalangeal joints. Small calcaneal spurs. IMPRESSION: Interval postoperative amputation of the right forefoot at the mid metatarsal level. Soft tissue gas is likely postoperative. Electronically Signed   By: Lucienne Capers M.D.   On: 12/04/2021 19:07   DG Foot Complete Right  Result Date: 12/03/2021 Please see detailed radiograph report in office note.  DG Foot Complete Right  Result Date: 11/29/2021 CLINICAL  DATA:  Distal third toe wound with exposed bone. EXAM: RIGHT FOOT COMPLETE - 3+ VIEW COMPARISON:  Right foot x-rays dated Mar 29, 2021. FINDINGS: New soft tissue ulceration of the tip of the third toe with underlying bony destruction of the third distal phalanx. Diffuse soft tissue swelling of the second and third toes. Prior first ray amputation. No acute fracture or dislocation. Bone mineralization is normal. IMPRESSION: 1. New soft tissue ulceration of the tip of the third toe with underlying bony destruction of the third distal phalanx, consistent with osteomyelitis. Electronically Signed   By: Titus Dubin M.D.   On: 11/29/2021 12:42   VAS [place] [name] WITH/WO TBI  Result Date: 12/06/2021  LOWER EXTREMITY DOPPLER STUDY Patient Name:  VIVAAN HELSETH  Date of Exam:   12/06/2021 Medical Rec #: [id number]        Accession #:    [id number] Date of Birth: Jan 02, 1966        Patient Gender: M Patient Age:   56 years Exam Location:  [facility] Procedure:      VAS [place] [name] WITH/WO TBI Referring Phys: Emeterio Reeve --------------------------------------------------------------------------------  Indications: RLE gangrene to toes - post op TMA. High Risk         Hypertension, hyperlipidemia, Diabetes, past history of Factors:          smoking. Other Factors: Left BKA.  Comparison Study: Previous exam 07/13/2019 RT 1.20 LT 1.02 Performing Technologist: Hill, Jody RVT, RDMS  Examination Guidelines: A complete evaluation includes at minimum, Doppler waveform signals and systolic blood pressure reading at the level of bilateral brachial, anterior tibial, and posterior tibial arteries, when vessel segments are accessible. Bilateral testing is considered an integral part of a complete examination. Photoelectric Plethysmograph (PPG) waveforms and toe systolic pressure readings are included as required and additional duplex testing as needed. Limited examinations for reoccurring indications may be performed as noted.  [name]  Findings: +--------+------------------+-----+---------+--------+  Right    Rt Pressure (mmHg) Index Waveform  Comment   +--------+------------------+-----+---------+--------+  Brachial 140                      triphasic           +--------+------------------+-----+---------+--------+ +--------+------------------+-----+---------+-------+  Left     Lt Pressure (mmHg) Index Waveform  Comment  +--------+------------------+-----+---------+-------+  Brachial 133                      triphasic          +--------+------------------+-----+---------+-------+  PTA      160                1.14  triphasic          +--------+------------------+-----+---------+-------+  DP       148                1.06  triphasic          +--------+------------------+-----+---------+-------+ +-------+-----------+-----------+------------+------------+  [name]/TBI Today's [name] Today's TBI Previous [name] Previous TBI  +-------+-----------+-----------+------------+------------+  Right   1.14        TMA                                    +-------+-----------+-----------+------------+------------+  Left    LT BKA                                             +-------+-----------+-----------+------------+------------+  Summary: Right: Resting right ankle-brachial index is within normal range. No evidence of significant right lower extremity arterial disease.  *See table(s) above for measurements and observations.  Electronically signed by Orlie Pollen on 12/06/2021 at 7:54:34 PM.    Final     Subjective: No complaints  Discharge Exam: Vitals:   12/07/21 0517 12/07/21 0831  BP: 133/70 126/71  Pulse: 65 80  Resp: 18 20  Temp: 97.6 F (36.4 C) 98.2 F (36.8 C)  SpO2: 99% 100%   Vitals:   12/06/21 1318 12/06/21 2028 12/07/21 0517 12/07/21 0831  BP: 138/81 127/69 133/70 126/71  Pulse: 70 81 65 80  Resp: 19 18 18 20   Temp: 98 F (36.7 C) 98.5 F (36.9 C) 97.6 F (36.4 C) 98.2 F (36.8 C)  TempSrc: Oral   Oral  SpO2: 98% 99% 99% 100%  Weight:       Height:        General: Pt is alert, awake, not in acute distress Cardiovascular: RRR, S1/S2 +, no rubs, no gallops Respiratory: CTA bilaterally, no wheezing, no rhonchi Abdominal: Soft, NT, ND, bowel sounds + Extremities: no edema, no cyanosis    The results of significant diagnostics from this hospitalization (including imaging, microbiology, ancillary and laboratory) are listed below for reference.     Microbiology: Recent Results (from the past 240 hour(s))  Blood Cultures x 2 sites     Status: None   Collection Time: 11/29/21 11:24 AM   Specimen: BLOOD LEFT HAND  Result Value Ref Range Status   Specimen Description BLOOD LEFT HAND  Final   Special Requests   Final    BOTTLES DRAWN AEROBIC AND ANAEROBIC Blood Culture adequate volume   Culture   Final    NO GROWTH 5 DAYS Performed at [facility], [address] [place], [place] [address]    Report Status 12/04/2021 FINAL  Final  Culture, blood (routine x 2)     Status: None (Preliminary result)   Collection Time: 12/03/21  3:58 PM   Specimen: BLOOD  Result Value Ref Range Status   Specimen Description BLOOD SITE NOT SPECIFIED  Final   Special Requests   Final    BOTTLES DRAWN AEROBIC AND ANAEROBIC Blood Culture adequate volume   Culture   Final    NO GROWTH 4 DAYS Performed at [facility], [place] [address], [place], [place] [address]    Report Status PENDING  Incomplete  Culture, blood (routine x 2)     Status: None (Preliminary result)   Collection Time: 12/03/21  5:25 PM   Specimen: BLOOD  Result  Value Ref Range Status   Specimen Description BLOOD RIGHT ANTECUBITAL  Final   Special Requests   Final    BOTTLES DRAWN AEROBIC AND ANAEROBIC Blood Culture results may not be optimal due to an inadequate volume of blood received in culture bottles   Culture   Final    NO GROWTH 4 DAYS Performed at [place] [address], [place], [place] [address]    Report Status PENDING  Incomplete   Resp Panel by RT-PCR (Flu A&B, Covid) Nasopharyngeal Swab     Status: None   Collection Time: 12/03/21  6:25 PM   Specimen: Nasopharyngeal Swab; Nasopharyngeal(NP) swabs in vial transport medium  Result Value Ref Range Status   SARS Coronavirus 2 by RT PCR NEGATIVE NEGATIVE Final    Comment: (NOTE) SARS-CoV-2 target nucleic acids are NOT DETECTED.  The SARS-CoV-2 RNA is generally detectable in upper respiratory specimens during the acute phase of infection. The lowest concentration of SARS-CoV-2 viral copies this assay can detect is 138 copies/mL. A negative result does not preclude SARS-Cov-2 infection and should not be used as the sole basis for treatment or other patient management decisions. A negative result may occur with  improper specimen collection/handling, submission of specimen other than nasopharyngeal swab, presence of viral mutation(s) within the areas targeted by this assay, and inadequate number of viral copies(<138 copies/mL). A negative result must be combined with clinical observations, patient history, and epidemiological information. The expected result is Negative.  Fact Sheet for Patients:  EntrepreneurPulse.com.au  Fact Sheet for Healthcare Providers:  IncredibleEmployment.be  This test is no t yet approved or cleared by the [place] FDA and  has been authorized for detection and/or diagnosis of SARS-CoV-2 by FDA under an Emergency Use Authorization (EUA). This EUA will remain  in effect (meaning this test can be used) for the duration of the COVID-19 declaration under Section 564(b)(1) of the Act, 21 U.S.C.section 360bbb-3(b)(1), unless the authorization is terminated  or revoked sooner.       Influenza A by PCR NEGATIVE NEGATIVE Final   Influenza B by PCR NEGATIVE NEGATIVE Final    Comment: (NOTE) The Xpert Xpress SARS-CoV-2/FLU/RSV plus assay is intended as an aid in the diagnosis of influenza from  Nasopharyngeal swab specimens and should not be used as a sole basis for treatment. Nasal washings and aspirates are unacceptable for Xpert Xpress SARS-CoV-2/FLU/RSV testing.  Fact Sheet for Patients: EntrepreneurPulse.com.au  Fact Sheet for Healthcare Providers: IncredibleEmployment.be  This test is not yet approved or cleared by the [place] FDA and has been authorized for detection and/or diagnosis of SARS-CoV-2 by FDA under an Emergency Use Authorization (EUA). This EUA will remain in effect (meaning this test can be used) for the duration of the COVID-19 declaration under Section 564(b)(1) of the Act, 21 U.S.C. section 360bbb-3(b)(1), unless the authorization is terminated or revoked.  Performed at [facility], [place] [address] [place], [place] [address]   Surgical PCR screen     Status: Abnormal   Collection Time: 12/04/21 11:33 AM   Specimen: Nasal Mucosa; Nasal Swab  Result Value Ref Range Status   MRSA, PCR POSITIVE (A) NEGATIVE Final    Comment: RESULT CALLED TO, READ BACK BY AND VERIFIED WITH: RN Ray Church [phone] AT 1448 BY CM    Staphylococcus aureus POSITIVE (A) NEGATIVE Final    Comment: (NOTE) The Xpert SA Assay (FDA approved for NASAL specimens in patients 82 years of age and older), is one component  of a comprehensive surveillance program. It is not intended to diagnose infection nor to guide or monitor treatment. Performed at [facility], [place] [address], [place], [place] [address]   Aerobic/Anaerobic Culture w Gram Stain (surgical/deep wound)     Status: None (Preliminary result)   Collection Time: 12/04/21 12:54 PM   Specimen: PATH Other; Tissue  Result Value Ref Range Status   Specimen Description FOOT  Final   Special Requests RIGHT FOOT AMPUTATION STUMP SPEC A  Final   Gram Stain NO WBC SEEN NO ORGANISMS SEEN   Final   Culture   Final    NO GROWTH 2 DAYS NO ANAEROBES ISOLATED; CULTURE IN PROGRESS  FOR 5 DAYS Performed at [facility], [place] [address] [place], [place] [address]    Report Status PENDING  Incomplete     Labs: BNP (last 3 results) No results for input(s): BNP in the last 8760 hours. Basic Metabolic Panel: Recent Labs  Lab 12/03/21 1558 12/04/21 0301 12/04/21 1520 12/05/21 0311  NA 134* 135  --  136  K 3.4* 3.4*  --  4.4  CL 106 105  --  108  CO2 22 21*  --  21*  GLUCOSE 152* 121*  --  115*  BUN 10 9  --  13  CREATININE 0.93 0.83  --  0.89  CALCIUM 9.1 8.7*  --  9.0  MG  --   --  1.7  --    Liver Function Tests: Recent Labs  Lab 12/03/21 1558  AST 29  ALT 18  ALKPHOS 78  BILITOT 0.4  PROT 8.8*  ALBUMIN 3.5   No results for input(s): LIPASE, AMYLASE in the last 168 hours. No results for input(s): AMMONIA in the last 168 hours. CBC: Recent Labs  Lab 12/03/21 1558 12/04/21 0301 12/05/21 0311 12/06/21 0332  WBC 9.2 9.4 8.7 7.4  NEUTROABS 5.1  --   --   --   HGB 11.8* 10.3* 9.6* 9.8*  HCT 35.3* 30.7* 29.2* 30.1*  MCV 87.8 88.7 88.5 88.5  PLT 427* 352 289 281   Cardiac Enzymes: No results for input(s): CKTOTAL, CKMB, CKMBINDEX, TROPONINI in the last 168 hours. BNP: Invalid input(s): POCBNP CBG: Recent Labs  Lab 12/06/21 0805 12/06/21 1118 12/06/21 1612 12/06/21 2026 12/07/21 0744  GLUCAP 100* 117* 172* 204* 108*   D-Dimer No results for input(s): DDIMER in the last 72 hours. Hgb A1c No results for input(s): HGBA1C in the last 72 hours. Lipid Profile No results for input(s): CHOL, HDL, LDLCALC, TRIG, CHOLHDL, LDLDIRECT in the last 72 hours. Thyroid function studies No results for input(s): TSH, T4TOTAL, T3FREE, THYROIDAB in the last 72 hours.  Invalid input(s): FREET3 Anemia work up No results for input(s): VITAMINB12, FOLATE, FERRITIN, TIBC, IRON, RETICCTPCT in the last 72 hours. Urinalysis    Component Value Date/Time   COLORURINE STRAW (A) 03/29/2021 [place] 03/29/2021 1705   LABSPEC 1.010  03/29/2021 1705   PHURINE 6.0 03/29/2021 1705   GLUCOSEU NEGATIVE 03/29/2021 1705   HGBUR MODERATE (A) 03/29/2021 1705   [place] 03/29/2021 [place] 03/29/2021 1705   PROTEINUR NEGATIVE 03/29/2021 1705   UROBILINOGEN 4.0 (H) 10/09/2008 1114   NITRITE NEGATIVE 03/29/2021 1705   LEUKOCYTESUR NEGATIVE 03/29/2021 1705   Sepsis Labs Invalid input(s): PROCALCITONIN,  WBC,  LACTICIDVEN Microbiology Recent Results (from the past 240 hour(s))  Blood Cultures x 2 sites     Status: None   Collection  Time: 11/29/21 11:24 AM   Specimen: BLOOD LEFT HAND  Result Value Ref Range Status   Specimen Description BLOOD LEFT HAND  Final   Special Requests   Final    BOTTLES DRAWN AEROBIC AND ANAEROBIC Blood Culture adequate volume   Culture   Final    NO GROWTH 5 DAYS Performed at [facility], [address], [place], [place] [address]    Report Status 12/04/2021 FINAL  Final  Culture, blood (routine x 2)     Status: None (Preliminary result)   Collection Time: 12/03/21  3:58 PM   Specimen: BLOOD  Result Value Ref Range Status   Specimen Description BLOOD SITE NOT SPECIFIED  Final   Special Requests   Final    BOTTLES DRAWN AEROBIC AND ANAEROBIC Blood Culture adequate volume   Culture   Final    NO GROWTH 4 DAYS Performed at [facility], [place] [address] [place], [place] [address]    Report Status PENDING  Incomplete  Culture, blood (routine x 2)     Status: None (Preliminary result)   Collection Time: 12/03/21  5:25 PM   Specimen: BLOOD  Result Value Ref Range Status   Specimen Description BLOOD RIGHT ANTECUBITAL  Final   Special Requests   Final    BOTTLES DRAWN AEROBIC AND ANAEROBIC Blood Culture results may not be optimal due to an inadequate volume of blood received in culture bottles   Culture   Final    NO GROWTH 4 DAYS Performed at [facility], [place] [address] [place], [place] [address]    Report Status PENDING  Incomplete  Resp  Panel by RT-PCR (Flu A&B, Covid) Nasopharyngeal Swab     Status: None   Collection Time: 12/03/21  6:25 PM   Specimen: Nasopharyngeal Swab; Nasopharyngeal(NP) swabs in vial transport medium  Result Value Ref Range Status   SARS Coronavirus 2 by RT PCR NEGATIVE NEGATIVE Final    Comment: (NOTE) SARS-CoV-2 target nucleic acids are NOT DETECTED.  The SARS-CoV-2 RNA is generally detectable in upper respiratory specimens during the acute phase of infection. The lowest concentration of SARS-CoV-2 viral copies this assay can detect is 138 copies/mL. A negative result does not preclude SARS-Cov-2 infection and should not be used as the sole basis for treatment or other patient management decisions. A negative result may occur with  improper specimen collection/handling, submission of specimen other than nasopharyngeal swab, presence of viral mutation(s) within the areas targeted by this assay, and inadequate number of viral copies(<138 copies/mL). A negative result must be combined with clinical observations, patient history, and epidemiological information. The expected result is Negative.  Fact Sheet for Patients:  EntrepreneurPulse.com.au  Fact Sheet for Healthcare Providers:  IncredibleEmployment.be  This test is no t yet approved or cleared by the [place] FDA and  has been authorized for detection and/or diagnosis of SARS-CoV-2 by FDA under an Emergency Use Authorization (EUA). This EUA will remain  in effect (meaning this test can be used) for the duration of the COVID-19 declaration under Section 564(b)(1) of the Act, 21 U.S.C.section 360bbb-3(b)(1), unless the authorization is terminated  or revoked sooner.       Influenza A by PCR NEGATIVE NEGATIVE Final   Influenza B by PCR NEGATIVE NEGATIVE Final    Comment: (NOTE) The Xpert Xpress SARS-CoV-2/FLU/RSV plus assay is intended as an aid in the diagnosis of influenza from Nasopharyngeal  swab specimens and should not be used as a sole basis  for treatment. Nasal washings and aspirates are unacceptable for Xpert Xpress SARS-CoV-2/FLU/RSV testing.  Fact Sheet for Patients: EntrepreneurPulse.com.au  Fact Sheet for Healthcare Providers: IncredibleEmployment.be  This test is not yet approved or cleared by the [place] FDA and has been authorized for detection and/or diagnosis of SARS-CoV-2 by FDA under an Emergency Use Authorization (EUA). This EUA will remain in effect (meaning this test can be used) for the duration of the COVID-19 declaration under Section 564(b)(1) of the Act, 21 U.S.C. section 360bbb-3(b)(1), unless the authorization is terminated or revoked.  Performed at [facility], [place] [address] [place], [place] [address]   Surgical PCR screen     Status: Abnormal   Collection Time: 12/04/21 11:33 AM   Specimen: Nasal Mucosa; Nasal Swab  Result Value Ref Range Status   MRSA, PCR POSITIVE (A) NEGATIVE Final    Comment: RESULT CALLED TO, READ BACK BY AND VERIFIED WITH: RN Ray Church [phone] AT 1448 BY CM    Staphylococcus aureus POSITIVE (A) NEGATIVE Final    Comment: (NOTE) The Xpert SA Assay (FDA approved for NASAL specimens in patients 33 years of age and older), is one component of a comprehensive surveillance program. It is not intended to diagnose infection nor to guide or monitor treatment. Performed at [facility], [place] [address], [place], [place] [address]   Aerobic/Anaerobic Culture w Gram Stain (surgical/deep wound)     Status: None (Preliminary result)   Collection Time: 12/04/21 12:54 PM   Specimen: PATH Other; Tissue  Result Value Ref Range Status   Specimen Description FOOT  Final   Special Requests RIGHT FOOT AMPUTATION STUMP SPEC A  Final   Gram Stain NO WBC SEEN NO ORGANISMS SEEN   Final   Culture   Final    NO GROWTH 2 DAYS NO ANAEROBES ISOLATED; CULTURE IN PROGRESS FOR 5  DAYS Performed at [facility], [place] [address], [place], [place] [address]    Report Status PENDING  Incomplete   SIGNED:   Charlynne Cousins, MD  [facility] 12/07/2021, 10:58 AM Pager   If 7PM-7AM, please contact night-coverage [email or web address] Password [email or web address]

## 2021-12-08 LAB — CULTURE, BLOOD (ROUTINE X 2)
Culture: NO GROWTH
Culture: NO GROWTH
Special Requests: ADEQUATE

## 2021-12-09 LAB — AEROBIC/ANAEROBIC CULTURE W GRAM STAIN (SURGICAL/DEEP WOUND)
Culture: NORMAL
Gram Stain: NONE SEEN

## 2021-12-14 ENCOUNTER — Other Ambulatory Visit: Payer: Self-pay

## 2021-12-14 ENCOUNTER — Encounter: Payer: Self-pay | Admitting: Podiatry

## 2021-12-14 ENCOUNTER — Ambulatory Visit (INDEPENDENT_AMBULATORY_CARE_PROVIDER_SITE_OTHER): Payer: Medicare Other | Admitting: Podiatry

## 2021-12-14 ENCOUNTER — Telehealth: Payer: Self-pay | Admitting: *Deleted

## 2021-12-14 VITALS — Temp 97.3°F

## 2021-12-14 DIAGNOSIS — Z9889 Other specified postprocedural states: Secondary | ICD-10-CM

## 2021-12-14 DIAGNOSIS — L97511 Non-pressure chronic ulcer of other part of right foot limited to breakdown of skin: Secondary | ICD-10-CM

## 2021-12-14 DIAGNOSIS — M869 Osteomyelitis, unspecified: Secondary | ICD-10-CM

## 2021-12-14 NOTE — Telephone Encounter (Signed)
Ben ,PT w/ [facility] is calling because he did a home visit yesterday and found the patient to be noncompliant in the home. The patient reported that he is taking no med's at all,lack of caregivers in the home. He did notify the PCP as well.  He is concerned about the foot, macerated, look likes he got the foot wet. Patient has an appointment in office today and encouraged him to keep that appointment.  He is recommending therapy treatment 2 weeks , 3 times, 1 week 4. He is also recommending a Education officer, museum, in home nurse for dressing changes if provider chooses. Please advise.

## 2021-12-14 NOTE — Progress Notes (Signed)
°  Subjective:  Patient ID: Eugene Diaz, male    DOB: 1966/02/18,  MRN: [id number]  Chief Complaint  Patient presents with   Routine Post Op    I had surgery on the right foot and I am doing ok and it does drain and ready to change     DOS: 12/04/21 Procedure: Transmetatarsal amputation right, Tendoachilles lengthening ([name])  56 y.o. male presents with the above complaint. History confirmed with patient. Has not changed the dressing as directed. Good spirits today. Ambulating with crutches and his boot.   Of note HHC reported to [place] he has not been compliant with his medications at home.  Objective:  Physical Exam: no tenderness at the surgical site, no edema noted, and calf supple, nontender. Incision: healing well, no dehiscence, no significant erythema, significant SS drainage noted on dressing, minimal active serous drainage.   Assessment:   1. Ulcer of right foot, limited to breakdown of skin ([place])   2. Post-operative state   3. Osteomyelitis of third toe of right foot ([place])     Plan:  Patient was evaluated and treated and all questions answered.  Post-operative State -Wound thoroughly cleansed, healing reasonably well. -Dressed with betadine WTD. No suture or staple removal at this time. -Discussed the importance of compliance with medications and restrictions. Specifically mentioned I am concerned with excessive weightbearing he can stress the incision and the Achilles tendon leading to rupture. -Offered wheelchair, states he has one. Encouraged him to use this but he states he does not really go anywhere so he doesn't have much use for it. I remain concerned he is a fall risk in addition to the extra pressure from weightbearing. States he will try to use the wheelchair. -I am concerned about his overall health and attitude and ability to be compliant. He is upbeat today and states "he is ready this thing healed." He states he will be more compliant with his  medications including the antibiotic. I asked him if there was anything additionally we could do for him today and he said no. He should be having more assistance with HHC hopefully including a Education officer, museum.   -XRs needed at follow-up: none   Return in about 1 week (around 12/21/2021).

## 2021-12-14 NOTE — Telephone Encounter (Signed)
Can we approve these orders - please let me know if they need a written order for these things

## 2021-12-15 ENCOUNTER — Telehealth: Payer: Self-pay | Admitting: *Deleted

## 2021-12-15 NOTE — Telephone Encounter (Signed)
Eugene Diaz requesting any new home health orders for patient since he was seen 1 day ago. Please advise.

## 2021-12-15 NOTE — Telephone Encounter (Signed)
Approve order

## 2021-12-15 NOTE — Telephone Encounter (Signed)
Selinda Eon, OT w/ [facility] completed a home health OT evaluation and is requesting a verbal order for focusing and improving independence, and safety in home w/ADL/IADL around the home. She would also like a once a week for 4 weeks order.Please advise

## 2021-12-16 NOTE — Telephone Encounter (Signed)
Called and notified Eugene Diaz that they order has been approved by physician.

## 2021-12-16 NOTE — Telephone Encounter (Signed)
Called and spoke w/ Suezanne Jacquet, PT with [facility], giving verbal approval per physician, verbalized understanding.

## 2021-12-17 ENCOUNTER — Telehealth: Payer: Self-pay | Admitting: Podiatry

## 2021-12-17 NOTE — Telephone Encounter (Signed)
Order was approved by Dr [name] , sent to [facility], spoke with Selinda Eon and faxed new orders 12/16/21

## 2021-12-17 NOTE — Telephone Encounter (Signed)
They got the regular home health orders. They need dressing change orders

## 2021-12-17 NOTE — Telephone Encounter (Signed)
Received call from Kulm @ [facility] and she is needing dressing change orders for this pt. He was a Dr [name]. She stated she had left message earlier this week for nurse

## 2021-12-21 ENCOUNTER — Other Ambulatory Visit: Payer: Self-pay | Admitting: Gastroenterology

## 2021-12-21 ENCOUNTER — Ambulatory Visit: Payer: Medicare Other | Admitting: Podiatry

## 2021-12-21 NOTE — Telephone Encounter (Signed)
Faxed orders to [facility] 12/21/21,attn.Inez Catalina

## 2021-12-22 ENCOUNTER — Ambulatory Visit (INDEPENDENT_AMBULATORY_CARE_PROVIDER_SITE_OTHER): Payer: Medicare Other | Admitting: Podiatry

## 2021-12-22 ENCOUNTER — Other Ambulatory Visit: Payer: Self-pay

## 2021-12-22 DIAGNOSIS — Z9889 Other specified postprocedural states: Secondary | ICD-10-CM

## 2021-12-22 DIAGNOSIS — L97511 Non-pressure chronic ulcer of other part of right foot limited to breakdown of skin: Secondary | ICD-10-CM

## 2021-12-23 ENCOUNTER — Telehealth: Payer: Self-pay | Admitting: *Deleted
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
## 2021-12-24 NOTE — Progress Notes (Signed)
°  Subjective:  Patient ID: Eugene Diaz, male    DOB: 11-Nov-1965,  MRN: [id number]  Chief Complaint  Patient presents with   Wound Check    DOS: 12/04/21 Procedure: Transmetatarsal amputation right, Tendoachilles lengthening ([name])  56 y.o. male presents with the above complaint. History confirmed with patient. Has not changed the dressing as directed.  He is doing okay.  No pain.  He is ambulating with crutches and boot.  He has been putting weight on the foot.  Of note HHC reported to [place] he has not been compliant with his medications at home.  Objective:  Physical Exam: no tenderness at the surgical site, no edema noted, and calf supple, nontender. Incision: healing well, no dehiscence, no significant erythema, significant SS drainage noted on dressing, minimal active serous drainage.   Assessment:   1. Ulcer of right foot, limited to breakdown of skin ([place])   2. Post-operative state      Plan:  Patient was evaluated and treated and all questions answered.  Post-operative State -Wound thoroughly cleansed, healing reasonably well. -Dressed with betadine WTD. No suture or staple removal at this time. -Discussed the importance of compliance with medications and restrictions. Specifically mentioned I am concerned with excessive weightbearing he can stress the incision and the Achilles tendon leading to rupture. -I discussed with him to try to maintain as much nonweightbearing status as possible however does not seem very compliant with that.  He states that he is doing well.  Overall healing okay. -  -XRs needed at follow-up: none   No follow-ups on file.

## 2022-01-03 LAB — FUNGUS CULTURE WITH STAIN

## 2022-01-03 LAB — FUNGAL ORGANISM REFLEX

## 2022-01-03 LAB — FUNGUS CULTURE RESULT

## 2022-01-05 ENCOUNTER — Ambulatory Visit (INDEPENDENT_AMBULATORY_CARE_PROVIDER_SITE_OTHER): Payer: Medicare Other | Admitting: Podiatry

## 2022-01-05 ENCOUNTER — Other Ambulatory Visit: Payer: Self-pay

## 2022-01-05 DIAGNOSIS — L97511 Non-pressure chronic ulcer of other part of right foot limited to breakdown of skin: Secondary | ICD-10-CM

## 2022-01-05 DIAGNOSIS — Z9889 Other specified postprocedural states: Secondary | ICD-10-CM

## 2022-01-05 NOTE — Progress Notes (Signed)
?  Subjective:  ?Patient ID: Eugene Diaz, male    DOB: 06/23/1966,  MRN: [id number] ? ?Chief Complaint  ?Patient presents with  ? Routine Post Op  ?  POV# 2 12/04/21  ? ? ?DOS: 12/04/21 ?Procedure: Transmetatarsal amputation right, Tendoachilles lengthening ([name]) ? ?56 y.o. male presents with the above complaint. History confirmed with patient. Has not changed the dressing as directed.  He is doing okay.  No pain.  He is ambulating with crutches and boot.  He has been putting weight on the foot. ? ?Of note HHC reported to [place] he has not been compliant with his medications at home. ? ?Objective:  ?Physical Exam: no tenderness at the surgical site, no edema noted, and calf supple, nontender. ?Incision: healing well, no dehiscence, no significant erythema, no dressing drainage noted.  Skin is completely reepithelialized.  No signs of dehiscence noted after removal of the staples and sutures ? ?Assessment:  ? ?1. Ulcer of right foot, limited to breakdown of skin ([place])   ?2. Post-operative state   ? ? ? ? ?Plan:  ?Patient was evaluated and treated and all questions answered. ? ?Post-operative State ?-Wound thoroughly cleansed, healing reasonably well. ?-Patient can transition to washing his foot as wound is still staying reepithelialized.  Sutures and staples were removed without any complication ?-Discussed the importance of compliance with medications and restrictions. Specifically mentioned I am concerned with excessive weightbearing he can stress the incision and the Achilles tendon leading to rupture. ?-I discussed with him to try to maintain as much nonweightbearing status as possible however does not seem very compliant with that.  He states that he is doing well.  Overall healing okay. ? ?-XRs needed at follow-up: none ? ? ?No follow-ups on file.  ? ?

## 2022-02-02 ENCOUNTER — Ambulatory Visit (INDEPENDENT_AMBULATORY_CARE_PROVIDER_SITE_OTHER): Payer: Medicare Other | Admitting: Podiatry

## 2022-02-02 DIAGNOSIS — L97511 Non-pressure chronic ulcer of other part of right foot limited to breakdown of skin: Secondary | ICD-10-CM

## 2022-02-02 DIAGNOSIS — Z9889 Other specified postprocedural states: Secondary | ICD-10-CM

## 2022-02-10 NOTE — Progress Notes (Signed)
?  Subjective:  ?Patient ID: Eugene Diaz, male    DOB: [birth year],  MRN: [id number] ? ?No chief complaint on file. ? ? ?DOS: 12/04/21 ?Procedure: Transmetatarsal amputation right, Tendoachilles lengthening ([name]) ? ?56 y.o. male presents with the above complaint. History confirmed with patient. Has not changed the dressing as directed.  He is doing okay.  No pain.  He is returned to regular shoes and doing well. ? ?Of note HHC reported to [place] he has not been compliant with his medications at home. ? ?Objective:  ?Physical Exam: no tenderness at the surgical site, no edema noted, and calf supple, nontender. ?Incision: healing well, no dehiscence, no significant erythema, no dressing drainage noted.  Skin is completely reepithelialized.  No signs of dehiscence noted after removal of the staples and sutures ? ?Assessment:  ? ?1. Ulcer of right foot, limited to breakdown of skin ([place])   ?2. Post-operative state   ? ? ? ? ? ?Plan:  ?Patient was evaluated and treated and all questions answered. ? ?Post-operative State ?-Wound thoroughly cleansed, healing reasonably well. ?-Clinically the wound has completely reepithelialized.  I discussed with him he can return to regular shoes without any restrictions.  If any foot and ankle issues arise in future of asked him to come back and see me.  He states understanding. ? ?-XRs needed at follow-up: none ? ? ?No follow-ups on file.  ? ?

## 2022-03-01 ENCOUNTER — Encounter (HOSPITAL_COMMUNITY): Payer: Self-pay | Admitting: Emergency Medicine

## 2022-03-01 ENCOUNTER — Other Ambulatory Visit: Payer: Self-pay

## 2022-03-01 ENCOUNTER — Emergency Department (HOSPITAL_COMMUNITY): Payer: Medicare HMO

## 2022-03-01 ENCOUNTER — Observation Stay (HOSPITAL_COMMUNITY)
Admission: EM | Admit: 2022-03-01 | Discharge: 2022-03-03 | Disposition: A | Discharge status: Home / Self Care | Payer: Medicare HMO | Attending: Internal Medicine | Admitting: Internal Medicine

## 2022-03-01 DIAGNOSIS — E1140 Type 2 diabetes mellitus with diabetic neuropathy, unspecified: Secondary | ICD-10-CM | POA: Insufficient documentation

## 2022-03-01 DIAGNOSIS — R0789 Other chest pain: Principal | ICD-10-CM | POA: Insufficient documentation

## 2022-03-01 DIAGNOSIS — F1010 Alcohol abuse, uncomplicated: Secondary | ICD-10-CM | POA: Insufficient documentation

## 2022-03-01 DIAGNOSIS — Z87891 Personal history of nicotine dependence: Secondary | ICD-10-CM | POA: Insufficient documentation

## 2022-03-01 DIAGNOSIS — J45909 Unspecified asthma, uncomplicated: Secondary | ICD-10-CM | POA: Diagnosis not present

## 2022-03-01 DIAGNOSIS — R0689 Other abnormalities of breathing: Secondary | ICD-10-CM | POA: Diagnosis not present

## 2022-03-01 DIAGNOSIS — R079 Chest pain, unspecified: Secondary | ICD-10-CM | POA: Diagnosis not present

## 2022-03-01 DIAGNOSIS — E785 Hyperlipidemia, unspecified: Secondary | ICD-10-CM | POA: Diagnosis not present

## 2022-03-01 DIAGNOSIS — Z7982 Long term (current) use of aspirin: Secondary | ICD-10-CM | POA: Diagnosis not present

## 2022-03-01 DIAGNOSIS — E1142 Type 2 diabetes mellitus with diabetic polyneuropathy: Secondary | ICD-10-CM | POA: Diagnosis present

## 2022-03-01 DIAGNOSIS — I2510 Atherosclerotic heart disease of native coronary artery without angina pectoris: Secondary | ICD-10-CM | POA: Insufficient documentation

## 2022-03-01 DIAGNOSIS — Z89612 Acquired absence of left leg above knee: Secondary | ICD-10-CM | POA: Diagnosis not present

## 2022-03-01 DIAGNOSIS — Z79899 Other long term (current) drug therapy: Secondary | ICD-10-CM | POA: Diagnosis not present

## 2022-03-01 DIAGNOSIS — Z720 Tobacco use: Secondary | ICD-10-CM

## 2022-03-01 DIAGNOSIS — I10 Essential (primary) hypertension: Secondary | ICD-10-CM | POA: Diagnosis not present

## 2022-03-01 DIAGNOSIS — E1169 Type 2 diabetes mellitus with other specified complication: Secondary | ICD-10-CM | POA: Diagnosis present

## 2022-03-01 DIAGNOSIS — Z7984 Long term (current) use of oral hypoglycemic drugs: Secondary | ICD-10-CM | POA: Diagnosis not present

## 2022-03-01 DIAGNOSIS — F1090 Alcohol use, unspecified, uncomplicated: Secondary | ICD-10-CM

## 2022-03-01 DIAGNOSIS — I213 ST elevation (STEMI) myocardial infarction of unspecified site: Secondary | ICD-10-CM | POA: Diagnosis not present

## 2022-03-01 MED ORDER — HYDROMORPHONE HCL 1 MG/ML IJ SOLN
1.0000 mg | Freq: Once | INTRAMUSCULAR | Status: AC
  Administered 2022-03-02: 1 mg via INTRAVENOUS
  Filled 2022-03-01: qty 1

## 2022-03-01 NOTE — ED Triage Notes (Signed)
Pt BIB [facility] from home, c/o sudden onset chest pain and shortness of breath. Given '6mg'$  morphine and '324mg'$  asa pta. Reports pain 6/10. ?

## 2022-03-01 NOTE — ED Provider Notes (Signed)
?[facility] DEPT ?[facility] Emergency Department ?Provider Note ?MRN:  [id number]  ?Arrival date & time: 03/02/22    ? ?Chief Complaint   ?Chest Pain ?  ?History of Present Illness   ?Eugene Diaz is a 56 y.o. year-old male with a history of hypertension, diabetes presenting to the ED with chief complaint of chest pain. ? ?Sudden onset chest pain 3 and half hours ago, left side of the chest.  Described as a pressure.  Denies nausea or vomiting, no shortness of breath, no dizziness or diaphoresis.  Pain currently 7 out of 10. ? ?Review of Systems  ?A thorough review of systems was obtained and all systems are negative except as noted in the HPI and PMH.  ? ?Patient's Health History   ? ?Past Medical History:  ?Diagnosis Date  ? Alcohol abuse   ? Alcoholic peripheral neuropathy ([place])   ? Anxiety   ? Arthritis   ? Asthma   ? followed by pcp  ? B12 deficiency   ? Depression   ? Diabetic neuropathy ([place])   ? Edema of both lower extremities   ? Essential tremor   ? neurologist-- dr patel--- due to alcohol abuse  ? GERD (gastroesophageal reflux disease)   ? History of cellulitis 2020  ? left lower leg;   recurrent 03-25-2020  ? History of esophageal stricture   ? s/p  dilatation 02-13-2018  ? History of osteomyelitis 2020  ? left great toe    ? Hypercholesterolemia   ? Hypertension   ? followed by pcp  (nuclear stress test 03-11-2014 low risk w/ no ischemia, ef 65%  ? Normocytic anemia   ? followed by pcp   (03-18-2020 had transfusion's 02/ 2021)  ? Osteomyelitis of great toe of right foot ([place]) 08/18/2021  ? Peripheral vascular disease ([place])   ? Severe depression ([place]) 06/09/2021  ? Status post incision and drainage followed by Dr [name] and pcp  ? 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 Suicidal ideation  06/09/2021  ? Thrombocytopenia ([place])   ? chronic  ? Type 2 diabetes mellitus ([place])   ? followed by pcp---    (03-18-2020  pt stated does not check blood sugar)  ?  ?Past Surgical History:  ?Procedure Laterality Date  ? AMPUTATION Left 10/16/2019  ? Procedure: Left partial second ray resection; placement of antibiotic beads;  Surgeon: Evelina Bucy, DPM;  Location: [facility];  Service: Podiatry;  Laterality: Left;  ? AMPUTATION Left 11/13/2019  ? Procedure: Left Midfoot Amputation - Transmetatarsal vs. Lisfranc; Placement antibiotic beads;  Surgeon: Evelina Bucy, DPM;  Location: [facility];  Service: Podiatry;  Laterality: Left;  ? AMPUTATION Left 12/21/2019  ? Procedure: Chopart Amputation left foot;  Surgeon: Evelina Bucy, DPM;  Location: [facility];  Service: Podiatry;  Laterality: Left;  ? AMPUTATION Left 06/24/2020  ? Procedure: LEFT BELOW KNEE AMPUTATION;  Surgeon: Newt Minion, MD;  Location: [facility];  Service: Orthopedics;  Laterality: Left;  ? AMPUTATION Right 03/29/2021  ? Procedure: AMPUTATION RAY;  Surgeon: Evelina Bucy, DPM;  Location: [place];  Service: Podiatry;  Laterality: Right;  ? AMPUTATION TOE Left 08/14/2019  ? Procedure: AMPUTATION GREAT TOE;  Surgeon: Evelina Bucy, DPM;  Location: [facility];  Service: Podiatry;  Laterality: Left;  ? APPLICATION OF WOUND VAC Left 12/21/2019  ? Procedure: Application Of Wound Vac;  Surgeon: Evelina Bucy, DPM;  Location: [facility];  Service: Podiatry;  Laterality: Left;  ? BIOPSY  02/13/2018  ? Procedure: BIOPSY;  Surgeon: Danie Binder, MD;  [facility];  Service: Endoscopy;;  transverse colon biopsy, gastric biopsy  ? COLONOSCOPY WITH PROPOFOL N/A 02/13/2018  ? Procedure: COLONOSCOPY WITH PROPOFOL;  Surgeon: Danie Binder, MD;  [facility];  Service: Endoscopy;  Laterality: N/A;  11:15am  ? ESOPHAGOGASTRODUODENOSCOPY (EGD) WITH PROPOFOL N/A 02/13/2018  ? Procedure: ESOPHAGOGASTRODUODENOSCOPY (EGD) WITH PROPOFOL;  Surgeon: Danie Binder,  MD;  [facility];  Service: Endoscopy;  Laterality: N/A;  ? I & D EXTREMITY Left 07/16/2019  ? Procedure: Insicion  AND DEBRIDEMENT EXTREMITY;  Surgeon: Evelina Bucy, DPM;  Location: [place];  Service: Podiatry;  Laterality: Left;  ? I & D EXTREMITY Left 06/22/2020  ? Procedure: IRRIGATION AND DEBRIDEMENT EXTREMITY, left foot and ankle;  Surgeon: Evelina Bucy, DPM;  Location: [place];  Service: Podiatry;  Laterality: Left;  ? I & D EXTREMITY Right 03/29/2021  ? Procedure: IRRIGATION AND DEBRIDEMENT EXTREMITY;  Surgeon: Evelina Bucy, DPM;  Location: [place];  Service: Podiatry;  Laterality: Right;  ? INCISION AND DRAINAGE OF WOUND Left 10/13/2019  ? Procedure: IRRIGATION AND DEBRIDEMENT WOUND OF LEFT FOOT AND FIRST METATARSAL RESECTION;  Surgeon: Evelina Bucy, DPM;  [facility];  Service: Podiatry;  Laterality: Left;  ? IRRIGATION AND DEBRIDEMENT FOOT Left 11/11/2019  ? Procedure: Left Foot Wound Irrigation and Debridement;  Surgeon: Evelina Bucy, DPM;  Location[facility];  Service: Podiatry;  Laterality: Left;  ? Left arm    ? Left arm repair (tendon and artery)  ? PERCUTANEOUS PINNING  07/16/2019  ? Procedure: Open Reduction Percutaneous Pinning Extremity;  Surgeon: Evelina Bucy, DPM;  Location: [place];  Service: Podiatry;;  ? PILONIDAL CYST EXCISION N/A 08/08/2014  ? Procedure: CYST EXCISION PILONIDAL EXTENSIVE;  Surgeon: Jamesetta So, MD;  Location: [facility];  Service: General;  Laterality: N/A;  ? POLYPECTOMY  02/13/2018  ? Procedure: POLYPECTOMY;  Surgeon: Danie Binder, MD;  [facility];  Service: Endoscopy;;  transverse colon polyp hs, rectal polyps times 2  ? SAVORY DILATION N/A 02/13/2018  ? Procedure: SAVORY DILATION;  Surgeon: Danie Binder, MD;  [facility];  Service: Endoscopy;  Laterality: N/A;  ? TENDON LENGTHENING Right 12/04/2021  ? Procedure: TENDON LENGTHENING;  Surgeon: Landis Martins, DPM;  Location: [facility];  Service: Podiatry;  Laterality:  Right;  ? TRANSMETATARSAL AMPUTATION Right 12/04/2021  ? Procedure: TRANSMETATARSAL AMPUTATION;  Surgeon: Landis Martins, DPM;  Location: [place];  Service: Podiatry;  Laterality: Right;  ? WOUND DEBRIDEMENT Left 09/04/2019  ? Procedure: DEBRIDEMENT WOUND WITH COMPLEX  REPAIR OF DEHISCENCE;  Surgeon: Evelina Bucy, DPM;  Location: [place];  Service: Podiatry;  Laterality: Left;  Leave patient on stretcher  ? WOUND DEBRIDEMENT Left 10/16/2019  ? Procedure: Left foot wound debridement and closure;  Surgeon: Evelina Bucy, DPM;  [facility];  Service: Podiatry;  Laterality: Left;  ? WOUND DEBRIDEMENT Left 12/18/2019  ? Procedure: LEFT FOOT DEBRIDEMENT WITH PARTIAL INCISION OF INFECTED BONE;  Surgeon: Evelina Bucy, DPM;  Location: [place];  Service: Podiatry;  Laterality: Left;  LEFT FOOT DEBRIDEMENT WITH PARTIAL INCISION OF INFECTED BONE  ?  WOUND DEBRIDEMENT Right 04/01/2021  ? Procedure: Right foot wound debridement and irrigation and closure, bone biopsy;  Surgeon: Evelina Bucy, DPM;  Location: [facility] [place];  Service: Podiatry;  Laterality: Right;  ?  ?Family History  ?Problem Relation Age of Onset  ? Heart attack Mother   ?     Living, 74  ? Cancer Father   ?     Deceased  ? Healthy Brother   ? Healthy Sister   ? Colon cancer Neg Hx   ? Colon polyps Neg Hx   ?  ?Social History  ? ?Socioeconomic History  ? Marital status: Widowed  ?  Spouse name: Not on file  ? Number of children: 0  ? Years of education: Not on file  ? Highest education level: 8th grade  ?Occupational History  ? Occupation: [employer]  ?  Comment: Assembling RV's and Horse Trailers.  ?Tobacco Use  ? Smoking status: Former  ?  Packs/day: 2.50  ?  Years: 25.00  ?  Pack years: 62.50  ?  Types: Cigarettes  ?  Quit date: 08/05/1984  ?  Years since quitting: 37.5  ? Smokeless tobacco: Never  ?Vaping Use  ? Vaping Use: Never used  ?Substance and Sexual Activity  ? Alcohol use: Not Currently  ?  Alcohol/week: 50.0 standard drinks  ?   Types: 50 Standard drinks or equivalent per week  ?  Comment: Drinks 6 beers, liquor 4-6oz (both daily) x 35 years  ? Drug use: Not Currently  ?  Types: Marijuana  ? Sexual activity: Yes  ?  Birth control/pr

## 2022-03-02 ENCOUNTER — Observation Stay (HOSPITAL_COMMUNITY): Payer: Medicare HMO

## 2022-03-02 ENCOUNTER — Encounter (HOSPITAL_COMMUNITY): Payer: Self-pay | Admitting: Internal Medicine

## 2022-03-02 DIAGNOSIS — E119 Type 2 diabetes mellitus without complications: Secondary | ICD-10-CM

## 2022-03-02 DIAGNOSIS — Z720 Tobacco use: Secondary | ICD-10-CM | POA: Diagnosis not present

## 2022-03-02 DIAGNOSIS — F1010 Alcohol abuse, uncomplicated: Secondary | ICD-10-CM

## 2022-03-02 DIAGNOSIS — E785 Hyperlipidemia, unspecified: Secondary | ICD-10-CM

## 2022-03-02 DIAGNOSIS — E1169 Type 2 diabetes mellitus with other specified complication: Secondary | ICD-10-CM | POA: Diagnosis not present

## 2022-03-02 DIAGNOSIS — I10 Essential (primary) hypertension: Secondary | ICD-10-CM

## 2022-03-02 DIAGNOSIS — R079 Chest pain, unspecified: Secondary | ICD-10-CM

## 2022-03-02 DIAGNOSIS — R072 Precordial pain: Secondary | ICD-10-CM | POA: Diagnosis not present

## 2022-03-02 DIAGNOSIS — R0789 Other chest pain: Secondary | ICD-10-CM | POA: Diagnosis not present

## 2022-03-02 LAB — RAPID URINE DRUG SCREEN, HOSP PERFORMED
Amphetamines: NOT DETECTED
Barbiturates: NOT DETECTED
Benzodiazepines: NOT DETECTED
Cocaine: NOT DETECTED
Opiates: POSITIVE — AB
Tetrahydrocannabinol: POSITIVE — AB

## 2022-03-02 LAB — CBC
HCT: 46.3 % (ref 39.0–52.0)
Hemoglobin: 16.3 g/dL (ref 13.0–17.0)
MCH: 31.2 pg (ref 26.0–34.0)
MCHC: 35.2 g/dL (ref 30.0–36.0)
MCV: 88.5 fL (ref 80.0–100.0)
Platelets: 178 10*3/uL (ref 150–400)
RBC: 5.23 MIL/uL (ref 4.22–5.81)
RDW: 14.2 % (ref 11.5–15.5)
WBC: 7.1 10*3/uL (ref 4.0–10.5)
nRBC: 0 % (ref 0.0–0.2)

## 2022-03-02 LAB — LIPID PANEL
Cholesterol: 128 mg/dL (ref 0–200)
HDL: 39 mg/dL — ABNORMAL LOW (ref >40)
LDL Cholesterol: 75 mg/dL (ref 0–99)
Total CHOL/HDL Ratio: 3.3 RATIO
Triglycerides: 68 mg/dL (ref <150)
VLDL: 14 mg/dL (ref 0–40)

## 2022-03-02 LAB — BASIC METABOLIC PANEL
Anion gap: 10 (ref 5–15)
BUN: 9 mg/dL (ref 6–20)
CO2: 20 mmol/L — ABNORMAL LOW (ref 22–32)
Calcium: 8.8 mg/dL — ABNORMAL LOW (ref 8.9–10.3)
Chloride: 113 mmol/L — ABNORMAL HIGH (ref 98–111)
Creatinine, Ser: 0.77 mg/dL (ref 0.61–1.24)
GFR, Estimated: >60 mL/min (ref >60)
Glucose, Bld: 128 mg/dL — ABNORMAL HIGH (ref 70–99)
Potassium: 3.6 mmol/L (ref 3.5–5.1)
Sodium: 143 mmol/L (ref 135–145)

## 2022-03-02 LAB — HEMOGLOBIN A1C
Hgb A1c MFr Bld: 5.7 % — ABNORMAL HIGH (ref 4.8–5.6)
Mean Plasma Glucose: 116.89 mg/dL

## 2022-03-02 LAB — CBG MONITORING, ED
Glucose-Capillary: 112 mg/dL — ABNORMAL HIGH (ref 70–99)
Glucose-Capillary: 102 mg/dL — ABNORMAL HIGH (ref 70–99)
Glucose-Capillary: 134 mg/dL — ABNORMAL HIGH (ref 70–99)

## 2022-03-02 LAB — TSH: TSH: 0.57 u[IU]/mL (ref 0.350–4.500)

## 2022-03-02 LAB — TROPONIN I (HIGH SENSITIVITY)
Troponin I (High Sensitivity): 7 ng/L (ref <18)
Troponin I (High Sensitivity): 7 ng/L (ref <18)

## 2022-03-02 MED ORDER — ENOXAPARIN SODIUM 40 MG/0.4ML IJ SOSY
40.0000 mg | PREFILLED_SYRINGE | INTRAMUSCULAR | Status: DC
  Administered 2022-03-02 – 2022-03-03 (×2): 40 mg via SUBCUTANEOUS
  Filled 2022-03-02 (×2): qty 0.4

## 2022-03-02 MED ORDER — NITROGLYCERIN 0.4 MG SL SUBL
SUBLINGUAL_TABLET | SUBLINGUAL | Status: AC
  Filled 2022-03-02: qty 2

## 2022-03-02 MED ORDER — IOHEXOL 350 MG/ML SOLN
95.0000 mL | Freq: Once | INTRAVENOUS | Status: AC | PRN
  Administered 2022-03-02: 95 mL via INTRAVENOUS

## 2022-03-02 MED ORDER — ACETAMINOPHEN 650 MG RE SUPP: 650.0000 mg | Freq: Four times a day (QID) | RECTAL | Status: DC | PRN

## 2022-03-02 MED ORDER — METOPROLOL TARTRATE 5 MG/5ML IV SOLN
INTRAVENOUS | Status: AC
  Filled 2022-03-02: qty 5

## 2022-03-02 MED ORDER — ASPIRIN EC 81 MG PO TBEC
81.0000 mg | DELAYED_RELEASE_TABLET | Freq: Every day | ORAL | Status: DC
  Administered 2022-03-02 – 2022-03-03 (×2): 81 mg via ORAL
  Filled 2022-03-02 (×2): qty 1

## 2022-03-02 MED ORDER — HYDRALAZINE HCL 20 MG/ML IJ SOLN
10.0000 mg | INTRAMUSCULAR | Status: DC | PRN
Start: 2022-03-02 — End: 2022-03-03

## 2022-03-02 MED ORDER — CARVEDILOL 3.125 MG PO TABS
6.2500 mg | ORAL_TABLET | Freq: Two times a day (BID) | ORAL | Status: DC
  Administered 2022-03-02 – 2022-03-03 (×2): 6.25 mg via ORAL
  Filled 2022-03-02 (×2): qty 2

## 2022-03-02 MED ORDER — ACETAMINOPHEN 325 MG PO TABS
650.0000 mg | ORAL_TABLET | Freq: Four times a day (QID) | ORAL | Status: DC | PRN
  Administered 2022-03-02: 650 mg via ORAL
  Filled 2022-03-02: qty 2

## 2022-03-02 MED ORDER — INSULIN ASPART 100 UNIT/ML IJ SOLN
0.0000 [IU] | INTRAMUSCULAR | Status: DC
  Administered 2022-03-03: 3 [IU] via SUBCUTANEOUS
  Administered 2022-03-03: 2 [IU] via SUBCUTANEOUS

## 2022-03-02 MED ORDER — IRBESARTAN 150 MG PO TABS
150.0000 mg | ORAL_TABLET | Freq: Every day | ORAL | Status: DC
  Administered 2022-03-02 – 2022-03-03 (×2): 150 mg via ORAL
  Filled 2022-03-02 (×2): qty 1

## 2022-03-02 MED ORDER — NITROGLYCERIN 0.4 MG SL SUBL: 0.4000 mg | SUBLINGUAL_TABLET | SUBLINGUAL | Status: DC | PRN

## 2022-03-02 NOTE — Hospital Course (Addendum)
56 year old male, lives alone, independent and ambulates with the help of his left lower extremity prosthesis, medical history significant for HTN, type II DM with peripheral neuropathy, ongoing tobacco use, s/p right forefoot amputation, s/p left AKA, multiple other medical problems, presented to the ED on 03/01/2022 with complaints of chest pain.  Cardiology consulted, s/p coronary CTA, mild, nonobstructive disease.  Chest pain felt to be nonischemic. ?

## 2022-03-02 NOTE — Assessment & Plan Note (Addendum)
It appears that patient has not been taking any of his meds at home. ?Cardiology has initiated him on HCTZ and valsartan and recommend discharging on the same simple regimen and patient has been counseled regarding compliance with all meds. ?Blood pressure is better. ?

## 2022-03-02 NOTE — ED Notes (Signed)
CBG 112. Glucometer not crossing results over. ?

## 2022-03-02 NOTE — Assessment & Plan Note (Addendum)
A1c 5.8 on 2/4, well controlled ?Significant peripheral neuropathy. ?Has not been taking any of his prescription meds at home.  Despite this his diabetes is well controlled. ?

## 2022-03-02 NOTE — Progress Notes (Signed)
?PROGRESS NOTE ?  ?Eugene Diaz  YIF:[id number]    DOB: 06-26-1966    DOA: 03/01/2022 ? ?PCP: [name], PA-C (Inactive)  ? ?I have briefly reviewed patients previous medical records in [facility]. ? ?Chief Complaint  ?Patient presents with  ? Chest Pain  ? ? ?Hospital Course:  ?56 year old male, lives alone, independent and ambulates with the help of his left lower extremity prosthesis, medical history significant for HTN, type II DM with peripheral neuropathy, ongoing tobacco use, s/p right forefoot amputation, s/p left AKA, multiple other medical problems, presented to the ED on 03/01/2022 with complaints of chest pain.  Cardiology consulted and input pending. ? ?Assessment and Plan: ?* Chest pain- w atypical and typical features ?Multiple CAD risk factors: DM with peripheral neuropathy, multiple amputations, HTN, family history (mother with history of MI in her 31s), tobacco use disorder ?Typical and atypical features. ?High-sensitivity troponins x2 negative.  Chest x-ray negative. ?Still with some precordial chest pain, some of it is reproducible to palpation. ?Continue aspirin and as needed sublingual NTG. ?Cardiology consulted, awaiting input regarding need for further ischemic evaluation. ? ?Type 2 diabetes mellitus with peripheral neuropathy ([place]) ?A1c 5.8 on 2/4, well controlled ?Significant peripheral neuropathy. ?Has not been taking any of his prescription meds at home.  Despite this his diabetes is well controlled. ?SSI. ? ?HTN (hypertension) ?It appears that patient has not been taking any of his meds at home. ?Resume lisinopril at 20 mg daily.  Monitor. ? ?Hyperlipidemia associated with type 2 diabetes mellitus ([place]) ?Not on meds PTA. ? ?Tobacco abuse ?Cessation counseled ? ? ?Body mass index is 35.95 kg/m?. ? ? ?DVT prophylaxis: enoxaparin (LOVENOX) injection 40 mg Start: 03/02/22 1400   ?  Code Status: Full Code:  ?Family Communication: None at bedside. ?Disposition:  ?Status is:  Observation ?The patient remains OBS appropriate and will d/c before 2 midnights. ?  ? ?Consultants:   ?Cardiology-pending ? ?Procedures:   ? ? ?Antimicrobials:   ? ? ? ?Subjective:  ?Seen this morning in ED.  No prior history of heart disease or chest pains.  Was sitting in chair watching TV at around 7 PM on 5/2 when developed sudden onset of precordial sharp/pressing chest pain without radiation, severe, associated with some dyspnea but no diaphoresis.?  Somewhat worse with deep inspiration.  Denies history of any long distance travel, asymmetric leg swelling or pain, cough, chest wall trauma or lifting or moving anything heavy.  Has occasional heartburn but indicates that this pain is this similar to that.  Currently with mild 3-4/10 intermittent chest pain. ? ?Objective:  ? ?Vitals:  ? 03/02/22 0615 03/02/22 0630 03/02/22 0645 03/02/22 0700  ?BP: (!) 163/95 (!) 158/115 (!) 141/81 (!) 170/100  ?Pulse: 85 82 86 85  ?Resp: '11 17 17 18  '$ ?SpO2: 97% 97% 96% 97%  ?Weight:      ?Height:      ? ? ?General exam: Middle-age male, moderately built and nourished, lying comfortably propped up in bed without distress.  Has a beard. ?Respiratory system: Clear to auscultation. Respiratory effort normal.  Somewhat reproducible precordial chest wall pain. ?Cardiovascular system: S1 & S2 heard, RRR. No JVD, murmurs, rubs, gallops or clicks. No pedal edema.  Telemetry personally reviewed: Sinus rhythm. ?Gastrointestinal system: Abdomen is nondistended, soft and nontender. No organomegaly or masses felt. Normal bowel sounds heard. ?Central nervous system: Alert and oriented. No focal neurological deficits. ?Extremities: Symmetric 5 x 5 power.  Left BKA with healed stump.  Right transmetatarsal amputation with healed stump.  Hypersensitivity to touch of right foot and leg. ?Skin: No rashes, lesions or ulcers ?Psychiatry: Judgement and insight appear normal. Mood & affect appropriate.  ? ?Data Reviewed:   ?I have personally reviewed  following labs and imaging studies ? ?CBC: ?Recent Labs  ?Lab 03/02/22 ?3295  ?WBC 7.1  ?HGB 16.3  ?HCT 46.3  ?MCV 88.5  ?PLT 178  ? ?Basic Metabolic Panel: ?Recent Labs  ?Lab 03/02/22 ?1884  ?NA 143  ?K 3.6  ?CL 113*  ?CO2 20*  ?GLUCOSE 128*  ?BUN 9  ?CREATININE 0.77  ?CALCIUM 8.8*  ? ?Liver Function Tests: ?No results for input(s): AST, ALT, ALKPHOS, BILITOT, PROT, ALBUMIN in the last 168 hours. ?CBG: ?No results for input(s): GLUCAP in the last 168 hours. ?Microbiology Studies:  ?No results found for this or any previous visit (from the past 240 hour(s)). ? ?Radiology Studies:  ?DG Chest Port 1 View ? ?Result Date: 03/01/2022 ?CLINICAL DATA:  Chest pain EXAM: PORTABLE CHEST 1 VIEW COMPARISON:  03/27/2021 FINDINGS: The heart size and mediastinal contours are within normal limits. Both lungs are clear. The visualized skeletal structures are unremarkable. IMPRESSION: No active disease. Electronically Signed   By: Inez Catalina M.D.   On: 03/01/2022 23:59   ? ?Scheduled Meds:  ? ? aspirin EC  81 mg Oral Daily  ? enoxaparin (LOVENOX) injection  40 mg Subcutaneous Q24H  ? insulin aspart  0-9 Units Subcutaneous Q4H  ? ? ?Continuous Infusions:  ? ? ? LOS: 0 days  ? ?Vernell Leep, MD,  ?FACP, [facility], [facility], [facility] (Care Management Physician Certified) ?[facility] Advisor ?[place] ? ?To contact the attending provider between 7A-7P or the covering provider during after hours 7P-7A, please log into the web site [email or web address] and access using universal  password for that web site. If you do not have the password, please call the hospital operator. ? ?03/02/2022, 8:03 AM   ?

## 2022-03-02 NOTE — Assessment & Plan Note (Signed)
Cessation counseled ?

## 2022-03-02 NOTE — Assessment & Plan Note (Addendum)
Multiple CAD risk factors: DM with peripheral neuropathy, multiple amputations, HTN, family history (mother with history of MI in her 77s), tobacco use disorder ?Typical and atypical features. ?High-sensitivity troponins x2 negative.  Chest x-ray negative. ?Partially reproducible to palpation. ?Treated with aspirin and as needed sublingual NTG. ?Cardiology consultation and signoff appreciated.  Coronary CTA showed mild, nonobstructive disease.  Patient was counseled extensively regarding management of his risk factors.  Cardiology recommended against starting aspirin due to concern for gastritis.  Also no statins in the context of ongoing alcohol use disorder but may benefit if he quits alcohol.  Cardiology cleared him for discharge. ?

## 2022-03-02 NOTE — Consult Note (Addendum)
?Cardiology Consultation:  ? ?Patient ID: Eugene Diaz ?MRN: [id number]; DOB: 1966-10-11 ? ?Admit date: 03/01/2022 ?Date of Consult: 03/02/2022 ? ?PCP:  [name], PA-C (Inactive) ?  ?[facility] Providers ?Cardiologist:  Remotely seen Dr Carlyle Dolly ? ? ?Patient Profile:  ? ?Eugene Diaz is a 56 y.o. male with a hx of type 2 DM, HTN, HLD, obesity, ETOH abuse, former tobacco use, depression, osteomyelitis with gangrene s/p left BKA and right transmetatarsal amputation 12/04/21, who is being seen 03/02/2022 for the evaluation of chest pain at the request of Dr Raliegh Ip. ? ?History of Present Illness:  ? ?Mr. Mckelvie with above PMH presented to ER with chest pain. He was last hospitlaized in 2/20213 and underwent transmetatarsal amputation on 12/04/2021 due to right third toe osteomyelitis with gangrene. Patient reports started having chest pain while watching TV last night. It's located at anterior chest wall with sharp quality, no radiating. Pain is not triggered by exertion nor relieved by resting. Pain is better now but remains since he arrived at ED. He denied any SOB, nausea, syncope, lege edema. Patient has not been taking care of himself, takes no medications for him chronic conditions. He smokes cigars daily, drinks "a lot" alcohol. His wife died 3 years ago and he feels depressed.  ? ?He remotely seen by Dr Carlyle Dolly IN 2015 for chest pain associated with diaphoresis and weakness, underwent Lexiscan myovue on 03/11/14 which was negative for ischemia and  a ow risk study. LV systolic function was normal. Echo from 03/11/14 showed EF 55-60%, grade II DD, trivial MR. Mild reduced RV systolic function.  ? ?Admission diagnostics showed unremarkable BMP and CBC. Hs top negative x2. EKG with sinus rhythm 98 bpm, old anterolateral infarct. He is hypertensive with BP up to 178/95 at ED, otherwise HD stable.  He is admitted to hospital medicine service and cardiology is consulted for chest pain today.  ? ? ?Past  Medical History:  ?Diagnosis Date  ? Alcohol abuse   ? Alcoholic peripheral neuropathy ([place])   ? Anxiety   ? Arthritis   ? Asthma   ? followed by pcp  ? B12 deficiency   ? Depression   ? Diabetic neuropathy ([place])   ? Edema of both lower extremities   ? Essential tremor   ? neurologist-- dr patel--- due to alcohol abuse  ? GERD (gastroesophageal reflux disease)   ? History of cellulitis 2020  ? left lower leg;   recurrent 03-25-2020  ? History of esophageal stricture   ? s/p  dilatation 02-13-2018  ? History of osteomyelitis 2020  ? left great toe    ? Hypercholesterolemia   ? Hypertension   ? followed by pcp  (nuclear stress test 03-11-2014 low risk w/ no ischemia, ef 65%  ? Normocytic anemia   ? followed by pcp   (03-18-2020 had transfusion's 02/ 2021)  ? Osteomyelitis of great toe of right foot ([place]) 08/18/2021  ? Peripheral vascular disease ([place])   ? Severe depression ([place]) 06/09/2021  ? Status post incision and drainage followed by Dr [name] and pcp  ? 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 Suicidal ideation 06/09/2021  ? Thrombocytopenia ([place])   ? chronic  ? Type 2 diabetes mellitus ([place])   ?  followed by pcp---    (03-18-2020  pt stated does not check blood sugar)  ? ? ?Past Surgical History:  ?Procedure Laterality Date  ? AMPUTATION Left 10/16/2019  ? Procedure: Left partial second ray resection; placement of antibiotic beads;  Surgeon: Evelina Bucy, DPM;  [facility];  Service: Podiatry;  Laterality: Left;  ? AMPUTATION Left 11/13/2019  ? Procedure: Left Midfoot Amputation - Transmetatarsal vs. Lisfranc; Placement antibiotic beads;  Surgeon: Evelina Bucy, DPM;  [facility];  Service: Podiatry;  Laterality: Left;  ? AMPUTATION Left 12/21/2019  ? Procedure: Chopart Amputation left foot;  Surgeon:  Evelina Bucy, DPM;  Location: [place];  Service: Podiatry;  Laterality: Left;  ? AMPUTATION Left 06/24/2020  ? Procedure: LEFT BELOW KNEE AMPUTATION;  Surgeon: Newt Minion, MD;  Location: [place];  Service: Orthopedics;  Laterality: Left;  ? AMPUTATION Right 03/29/2021  ? Procedure: AMPUTATION RAY;  Surgeon: Evelina Bucy, DPM;  Location: [place];  Service: Podiatry;  Laterality: Right;  ? AMPUTATION TOE Left 08/14/2019  ? Procedure: AMPUTATION GREAT TOE;  Surgeon: Evelina Bucy, DPM;  [facility];  Service: Podiatry;  Laterality: Left;  ? APPLICATION OF WOUND VAC Left 12/21/2019  ? Procedure: Application Of Wound Vac;  Surgeon: Evelina Bucy, DPM;  Location: [place];  Service: Podiatry;  Laterality: Left;  ? BIOPSY  02/13/2018  ? Procedure: BIOPSY;  Surgeon: Danie Binder, MD;  [facility];  Service: Endoscopy;;  transverse colon biopsy, gastric biopsy  ? COLONOSCOPY WITH PROPOFOL N/A 02/13/2018  ? Procedure: COLONOSCOPY WITH PROPOFOL;  Surgeon: Danie Binder, MD;  [facility];  Service: Endoscopy;  Laterality: N/A;  11:15am  ? ESOPHAGOGASTRODUODENOSCOPY (EGD) WITH PROPOFOL N/A 02/13/2018  ? Procedure: ESOPHAGOGASTRODUODENOSCOPY (EGD) WITH PROPOFOL;  Surgeon: Danie Binder, MD;  [facility];  Service: Endoscopy;  Laterality: N/A;  ? I & D EXTREMITY Left 07/16/2019  ? Procedure: Insicion  AND DEBRIDEMENT EXTREMITY;  Surgeon: Evelina Bucy, DPM;  Location: [place];  Service: Podiatry;  Laterality: Left;  ? I & D EXTREMITY Left 06/22/2020  ? Procedure: IRRIGATION AND DEBRIDEMENT EXTREMITY, left foot and ankle;  Surgeon: Evelina Bucy, DPM;  Location: [place];  Service: Podiatry;  Laterality: Left;  ? I & D EXTREMITY Right 03/29/2021  ? Procedure: IRRIGATION AND DEBRIDEMENT EXTREMITY;  Surgeon: Evelina Bucy, DPM;  Location: [place];  Service: Podiatry;  Laterality: Right;  ? INCISION AND DRAINAGE OF WOUND Left 10/13/2019  ? Procedure: IRRIGATION AND DEBRIDEMENT WOUND OF  LEFT FOOT AND FIRST METATARSAL RESECTION;  Surgeon: Evelina Bucy, DPM;  [facility];  Service: Podiatry;  Laterality: Left;  ? IRRIGATION AND DEBRIDEMENT FOOT Left 11/11/2019  ? Procedure: Left Foot Wound Irrigation and Debridement;  Surgeon: Evelina Bucy, DPM;  [facility];  Service: Podiatry;  Laterality: Left;  ? Left arm    ? Left arm repair (tendon and artery)  ? PERCUTANEOUS PINNING  07/16/2019  ? Procedure: Open Reduction Percutaneous Pinning Extremity;  Surgeon: Evelina Bucy, DPM;  Location: [place];  Service: Podiatry;;  ? PILONIDAL CYST EXCISION N/A 08/08/2014  ? Procedure: CYST EXCISION PILONIDAL EXTENSIVE;  Surgeon: Jamesetta So, MD;  [facility];  Service: General;  Laterality: N/A;  ? POLYPECTOMY  02/13/2018  ? Procedure: POLYPECTOMY;  Surgeon: Danie Binder, MD;  [facility];  Service: Endoscopy;;  transverse colon polyp hs, rectal polyps times  2  ? SAVORY DILATION N/A 02/13/2018  ? Procedure: SAVORY DILATION;  Surgeon: Danie Binder, MD;  [facility];  Service: Endoscopy;  Laterality: N/A;  ? TENDON LENGTHENING Right 12/04/2021  ? Procedure: TENDON LENGTHENING;  Surgeon: Landis Martins, DPM;  Location: [place];  Service: Podiatry;  Laterality: Right;  ? TRANSMETATARSAL AMPUTATION Right 12/04/2021  ? Procedure: TRANSMETATARSAL AMPUTATION;  Surgeon: Landis Martins, DPM;  Location: [place];  Service: Podiatry;  Laterality: Right;  ? WOUND DEBRIDEMENT Left 09/04/2019  ? Procedure: DEBRIDEMENT WOUND WITH COMPLEX  REPAIR OF DEHISCENCE;  Surgeon: Evelina Bucy, DPM;  Location: [place];  Service: Podiatry;  Laterality: Left;  Leave patient on stretcher  ? WOUND DEBRIDEMENT Left 10/16/2019  ? Procedure: Left foot wound debridement and closure;  Surgeon: Evelina Bucy, DPM;  [facility];  Service: Podiatry;  Laterality: Left;  ? WOUND DEBRIDEMENT Left 12/18/2019  ? Procedure: LEFT FOOT DEBRIDEMENT WITH PARTIAL INCISION OF INFECTED BONE;   Surgeon: Evelina Bucy, DPM;  Location: [place];  Service: Podiatry;  Laterality: Left;  LEFT FOOT DEBRIDEMENT WITH PARTIAL INCISION OF INFECTED BONE  ? WOUND DEBRIDEMENT Right 04/01/2021  ? Procedur

## 2022-03-02 NOTE — H&P (Signed)
?History and Physical  ? ? ?[name] AVW:[id number] DOB: 10-Feb-1966 DOA: 03/01/2022 ? ?PCP: [name], PA-C (Inactive)  ?Patient coming from: Home. ? ?Chief Complaint: Chest pain. ? ?HPI: Eugene Diaz is a 56 y.o. male with history of hypertension, diabetes mellitus, depression, tobacco abuse who was recently admitted 2 months ago for transmetatarsal amputation of the right foot for osteomyelitis has prior history of left BKA presents to the ER because of chest pain.  Patient started developing chest pain last night when watching TV.  Chest pain is mostly left anterior chest wall nonradiating pressure-like has no relation to exertion no associated shortness of breath diaphoresis abdominal pain nausea or vomiting. ? ?Patient states he has not been taking his medications for the last 2 weeks as he ran out of all of them. ? ?ED Course: On the way to the ER patient was given aspirin patient's chest pain improved but still persists.  Chest x-ray cardiac markers and EKG were unremarkable.  Patient has risk factors for ACS including diabetes tobacco abuse and hypertension. ? ?Review of Systems: As per HPI, rest all negative. ? ? ?Past Medical History:  ?Diagnosis Date  ? Alcohol abuse   ? Alcoholic peripheral neuropathy ([place])   ? Anxiety   ? Arthritis   ? Asthma   ? followed by pcp  ? B12 deficiency   ? Depression   ? Diabetic neuropathy ([place])   ? Edema of both lower extremities   ? Essential tremor   ? neurologist-- dr patel--- due to alcohol abuse  ? GERD (gastroesophageal reflux disease)   ? History of cellulitis 2020  ? left lower leg;   recurrent 03-25-2020  ? History of esophageal stricture   ? s/p  dilatation 02-13-2018  ? History of osteomyelitis 2020  ? left great toe    ? Hypercholesterolemia   ? Hypertension   ? followed by pcp  (nuclear stress test 03-11-2014 low risk w/ no ischemia, ef 65%  ? Normocytic anemia   ? followed by pcp   (03-18-2020 had transfusion's 02/ 2021)  ? Osteomyelitis of great  toe of right foot ([place]) 08/18/2021  ? Peripheral vascular disease ([place])   ? Severe depression ([place]) 06/09/2021  ? Status post incision and drainage followed by Dr [name] and pcp  ? 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 Suicidal ideation 06/09/2021  ? Thrombocytopenia ([place])   ? chronic  ? Type 2 diabetes mellitus ([place])   ? followed by pcp---    (03-18-2020  pt stated does not check blood sugar)  ? ? ?Past Surgical History:  ?Procedure Laterality Date  ? AMPUTATION Left 10/16/2019  ? Procedure: Left partial second ray resection; placement of antibiotic beads;  Surgeon: Evelina Bucy, DPM;  [facility];  Service: Podiatry;  Laterality: Left;  ? AMPUTATION Left 11/13/2019  ? Procedure: Left Midfoot Amputation - Transmetatarsal vs. Lisfranc; Placement antibiotic beads;  Surgeon: Evelina Bucy, DPM;  [facility];  Service: Podiatry;  Laterality: Left;  ? AMPUTATION Left 12/21/2019  ? Procedure: Chopart Amputation left foot;  Surgeon: Evelina Bucy, DPM;  Location: [place];  Service: Podiatry;  Laterality: Left;  ? AMPUTATION Left 06/24/2020  ? Procedure: LEFT BELOW KNEE AMPUTATION;  Surgeon: Sharol Given,  Illene Regulus, MD;  Location: [place];  Service: Orthopedics;  Laterality: Left;  ? AMPUTATION Right 03/29/2021  ? Procedure: AMPUTATION RAY;  Surgeon: Evelina Bucy, DPM;  Location: [place];  Service: Podiatry;  Laterality: Right;  ? AMPUTATION TOE Left 08/14/2019  ? Procedure: AMPUTATION GREAT TOE;  Surgeon: Evelina Bucy, DPM;  [facility];  Service: Podiatry;  Laterality: Left;  ? APPLICATION OF WOUND VAC Left 12/21/2019  ? Procedure: Application Of Wound Vac;  Surgeon: Evelina Bucy, DPM;  Location: [place];  Service: Podiatry;  Laterality: Left;  ? BIOPSY  02/13/2018  ? Procedure: BIOPSY;  Surgeon: Danie Binder, MD;  [facility];  Service: Endoscopy;;  transverse colon biopsy, gastric biopsy  ? COLONOSCOPY WITH PROPOFOL N/A 02/13/2018  ? Procedure: COLONOSCOPY WITH PROPOFOL;  Surgeon: Danie Binder, MD;  [facility];  Service: Endoscopy;  Laterality: N/A;  11:15am  ? ESOPHAGOGASTRODUODENOSCOPY (EGD) WITH PROPOFOL N/A 02/13/2018  ? Procedure: ESOPHAGOGASTRODUODENOSCOPY (EGD) WITH PROPOFOL;  Surgeon: Danie Binder, MD;  [facility];  Service: Endoscopy;  Laterality: N/A;  ? I & D EXTREMITY Left 07/16/2019  ? Procedure: Insicion  AND DEBRIDEMENT EXTREMITY;  Surgeon: Evelina Bucy, DPM;  Location: [place];  Service: Podiatry;  Laterality: Left;  ? I & D EXTREMITY Left 06/22/2020  ? Procedure: IRRIGATION AND DEBRIDEMENT EXTREMITY, left foot and ankle;  Surgeon: Evelina Bucy, DPM;  Location: [place];  Service: Podiatry;  Laterality: Left;  ? I & D EXTREMITY Right 03/29/2021  ? Procedure: IRRIGATION AND DEBRIDEMENT EXTREMITY;  Surgeon: Evelina Bucy, DPM;  Location: [place];  Service: Podiatry;  Laterality: Right;  ? INCISION AND DRAINAGE OF WOUND Left 10/13/2019  ? Procedure: IRRIGATION AND DEBRIDEMENT WOUND OF LEFT FOOT AND FIRST METATARSAL RESECTION;  Surgeon: Evelina Bucy, DPM;  [facility];  Service: Podiatry;  Laterality: Left;  ? IRRIGATION AND DEBRIDEMENT FOOT Left 11/11/2019  ? Procedure: Left Foot Wound Irrigation and Debridement;  Surgeon: Evelina Bucy, DPM;  [facility];  Service: Podiatry;  Laterality: Left;  ? Left arm    ? Left arm repair (tendon and artery)  ? PERCUTANEOUS PINNING  07/16/2019  ? Procedure: Open Reduction Percutaneous Pinning Extremity;  Surgeon: Evelina Bucy, DPM;  Location: [place];  Service: Podiatry;;  ? PILONIDAL CYST EXCISION N/A 08/08/2014  ? Procedure: CYST EXCISION PILONIDAL EXTENSIVE;  Surgeon: Jamesetta So, MD;  [facility];  Service: General;  Laterality: N/A;  ? POLYPECTOMY  02/13/2018  ? Procedure: POLYPECTOMY;   Surgeon: Danie Binder, MD;  [facility];  Service: Endoscopy;;  transverse colon polyp hs, rectal polyps times 2  ? SAVORY DILATION N/A 02/13/2018  ? Procedure: SAVORY DILATION;  Surgeon: Danie Binder, MD;  [facility];  Service: Endoscopy;  Laterality: N/A;  ? TENDON LENGTHENING Right 12/04/2021  ? Procedure: TENDON LENGTHENING;  Surgeon: Landis Martins, DPM;  Location: [place];  Service: Podiatry;  Laterality: Right;  ? TRANSMETATARSAL AMPUTATION Right 12/04/2021  ? Procedure: TRANSMETATARSAL AMPUTATION;  Surgeon: Landis Martins, DPM;  Location: [place];  Service: Podiatry;  Laterality: Right;  ? WOUND DEBRIDEMENT Left 09/04/2019  ? Procedure: DEBRIDEMENT WOUND WITH COMPLEX  REPAIR OF DEHISCENCE;  Surgeon: Evelina Bucy, DPM;  Location: [place];  Service: Podiatry;  Laterality: Left;  Leave patient on stretcher  ? WOUND DEBRIDEMENT Left 10/16/2019  ? Procedure: Left foot wound debridement and  closure;  Surgeon: Evelina Bucy, DPM;  [facility];  Service: Podiatry;  Laterality: Left;  ? WOUND DEBRIDEMENT Left 12/18/2019  ? Procedure: LEFT FOOT DEBRIDEMENT WITH PARTIAL INCISION OF INFECTED BONE;  Surgeon: Evelina Bucy, DPM;  Location: [place];  Service: Podiatry;  Laterality: Left;  LEFT FOOT DEBRIDEMENT WITH PARTIAL INCISION OF INFECTED BONE  ? WOUND DEBRIDEMENT Right 04/01/2021  ? Procedure: Right foot wound debridement and irrigation and closure, bone biopsy;  Surgeon: Evelina Bucy, DPM;  Location: [place];  Service: Podiatry;  Laterality: Right;  ? ? ? reports that he quit smoking about 37 years ago. His smoking use included cigarettes. He has a 62.50 pack-year smoking history. He has never used smokeless tobacco. He reports that he does not currently use alcohol after a past usage of about 50.0 standard drinks per week. He reports that he does not currently use drugs after having used the following drugs: Marijuana. ? ?No Known Allergies ? ?Family History   ?Problem Relation Age of Onset  ? Heart attack Mother   ?     Living, 74  ? Cancer Father   ?     Deceased  ? Healthy Brother   ? Healthy Sister   ? Colon cancer Neg Hx   ? Colon polyps Neg Hx   ? ? ?Prior

## 2022-03-02 NOTE — Assessment & Plan Note (Signed)
Not on meds PTA. ?

## 2022-03-03 ENCOUNTER — Observation Stay (HOSPITAL_BASED_OUTPATIENT_CLINIC_OR_DEPARTMENT_OTHER): Payer: Medicare HMO

## 2022-03-03 DIAGNOSIS — E785 Hyperlipidemia, unspecified: Secondary | ICD-10-CM | POA: Diagnosis not present

## 2022-03-03 DIAGNOSIS — F1010 Alcohol abuse, uncomplicated: Secondary | ICD-10-CM | POA: Diagnosis not present

## 2022-03-03 DIAGNOSIS — R079 Chest pain, unspecified: Secondary | ICD-10-CM

## 2022-03-03 DIAGNOSIS — R072 Precordial pain: Secondary | ICD-10-CM | POA: Diagnosis not present

## 2022-03-03 DIAGNOSIS — R0789 Other chest pain: Secondary | ICD-10-CM | POA: Diagnosis not present

## 2022-03-03 DIAGNOSIS — E1169 Type 2 diabetes mellitus with other specified complication: Secondary | ICD-10-CM | POA: Diagnosis not present

## 2022-03-03 DIAGNOSIS — F1090 Alcohol use, unspecified, uncomplicated: Secondary | ICD-10-CM

## 2022-03-03 DIAGNOSIS — Z720 Tobacco use: Secondary | ICD-10-CM | POA: Diagnosis not present

## 2022-03-03 DIAGNOSIS — I10 Essential (primary) hypertension: Secondary | ICD-10-CM | POA: Diagnosis not present

## 2022-03-03 LAB — CBG MONITORING, ED
Glucose-Capillary: 113 mg/dL — ABNORMAL HIGH (ref 70–99)
Glucose-Capillary: 247 mg/dL — ABNORMAL HIGH (ref 70–99)
Glucose-Capillary: 154 mg/dL — ABNORMAL HIGH (ref 70–99)

## 2022-03-03 LAB — ECHOCARDIOGRAM COMPLETE
AR max vel: 3.33 cm2
AV Area VTI: 3.66 cm2
AV Area mean vel: 3.36 cm2
AV Mean grad: 3 mmHg
AV Peak grad: 7 mmHg
Ao pk vel: 1.32 m/s
Area-P 1/2: 2.47 cm2
Height: 74 in
MV VTI: 3.6 cm2
S' Lateral: 3.3 cm
Weight: 4480 oz

## 2022-03-03 MED ORDER — ADULT MULTIVITAMIN W/MINERALS CH
1.0000 | ORAL_TABLET | Freq: Every day | ORAL | Status: DC
  Administered 2022-03-03: 1 via ORAL
  Filled 2022-03-03: qty 1

## 2022-03-03 MED ORDER — FOLIC ACID 1 MG PO TABS
1.0000 mg | ORAL_TABLET | Freq: Every day | ORAL | Status: DC
  Administered 2022-03-03: 1 mg via ORAL
  Filled 2022-03-03: qty 1

## 2022-03-03 MED ORDER — DICLOFENAC SODIUM 1 % EX GEL
4.0000 g | Freq: Three times a day (TID) | CUTANEOUS | Status: DC
  Administered 2022-03-03 (×2): 4 g via TOPICAL
  Filled 2022-03-03: qty 100

## 2022-03-03 MED ORDER — IRBESARTAN 150 MG PO TABS: 150.0000 mg | ORAL_TABLET | Freq: Every day | ORAL | 1 refills | Status: DC

## 2022-03-03 MED ORDER — THIAMINE HCL 100 MG/ML IJ SOLN: 100.0000 mg | Freq: Every day | INTRAMUSCULAR | Status: DC

## 2022-03-03 MED ORDER — HYDROCHLOROTHIAZIDE 12.5 MG PO TABS: 12.5000 mg | ORAL_TABLET | Freq: Every day | ORAL | 1 refills | Status: DC

## 2022-03-03 MED ORDER — DICLOFENAC SODIUM 1 % EX GEL: 4.0000 g | Freq: Four times a day (QID) | CUTANEOUS | 0 refills | Status: DC | PRN

## 2022-03-03 MED ORDER — HYDROCHLOROTHIAZIDE 12.5 MG PO TABS
12.5000 mg | ORAL_TABLET | Freq: Every day | ORAL | Status: DC
Start: 2022-03-03 — End: 2022-03-03
  Administered 2022-03-03: 12.5 mg via ORAL
  Filled 2022-03-03: qty 1

## 2022-03-03 MED ORDER — LORAZEPAM 2 MG/ML IJ SOLN: 1.0000 mg | INTRAMUSCULAR | Status: DC | PRN

## 2022-03-03 MED ORDER — FOLIC ACID 1 MG PO TABS: 1.0000 mg | ORAL_TABLET | Freq: Every day | ORAL | 0 refills | Status: AC

## 2022-03-03 MED ORDER — LORAZEPAM 1 MG PO TABS: 1.0000 mg | ORAL_TABLET | ORAL | Status: DC | PRN

## 2022-03-03 MED ORDER — PERFLUTREN LIPID MICROSPHERE
1.0000 mL | INTRAVENOUS | Status: AC | PRN
  Administered 2022-03-03: 4 mL via INTRAVENOUS
  Filled 2022-03-03: qty 10

## 2022-03-03 MED ORDER — THIAMINE HCL 100 MG PO TABS
100.0000 mg | ORAL_TABLET | Freq: Every day | ORAL | Status: DC
  Administered 2022-03-03: 100 mg via ORAL
  Filled 2022-03-03: qty 1

## 2022-03-03 MED ORDER — ADULT MULTIVITAMIN W/MINERALS CH
1.0000 | ORAL_TABLET | Freq: Every day | ORAL | Status: DC
Start: 2022-03-04 — End: 2022-08-29

## 2022-03-03 MED ORDER — ACETAMINOPHEN 325 MG PO TABS: 650.0000 mg | ORAL_TABLET | Freq: Four times a day (QID) | ORAL | Status: DC | PRN

## 2022-03-03 MED ORDER — THIAMINE HCL 100 MG PO TABS: 100.0000 mg | ORAL_TABLET | Freq: Every day | ORAL | 0 refills | Status: AC

## 2022-03-03 NOTE — Discharge Instructions (Signed)

## 2022-03-03 NOTE — Assessment & Plan Note (Signed)
Patient reports drinking 1/5 of liquor daily and the last time was on the night of admission. ?No withdrawal features. ?Continue multivitamins, thiamine and folate ?Counseled extensively regarding moderation and eventual cessation. ?TOC consulted for substance abuse counseling and outpatient resources.  He reportedly is struggled a lot since the death of his wife 3 years ago but now wishes to take better care of himself. ?

## 2022-03-03 NOTE — Discharge Summary (Signed)
Physician Discharge Summary  ?Eugene Diaz JHE:[id number] DOB: 18-Oct-1966 ? ?PCP: [name], PA-C (Inactive) ? ?Admitted from: Home ?Discharged to: Home ? ?Admit date: 03/01/2022 ?Discharge date: 03/03/2022 ? ?Recommendations for Outpatient Follow-up:  ? ? Follow-up Information   ? ? [name], PA-C. Schedule an appointment as soon as possible for a visit in 1 week(s).   ?Specialties: Physician Assistant, Internal Medicine ?Why: To be seen with repeat labs (CBC & CMP). ?Contact information: ?[address] ?[address] ?[place] [address] ?[phone] ? ? ?  ?  ? ?  ?  ? ?  ? ? ?Home Health: None ?  ? ?Equipment/Devices: None ?  ? ?Discharge Condition: Improved and stable. ?  Code Status: Full Code ?Diet recommendation:  ?Discharge Diet Orders (From admission, onward)  ? ?  Start     Ordered  ? 03/03/22 0000  Diet - low sodium heart healthy       ? 03/03/22 1111  ? ?  ?  ? ?  ?  ? ?Discharge Diagnoses:  ?Principal Problem: ?  Chest pain- w atypical and typical features ?Active Problems: ?  Type 2 diabetes mellitus with peripheral neuropathy ([place]) ?  HTN (hypertension) ?  Hyperlipidemia associated with type 2 diabetes mellitus ([place]) ?  Tobacco abuse ?  Alcohol use disorder ? ? ?Brief Hospital Course: ?56 year old male, lives alone, independent and ambulates with the help of his left lower extremity prosthesis, medical history significant for HTN, type II DM with peripheral neuropathy, ongoing tobacco use, s/p right forefoot amputation, s/p left AKA, multiple other medical problems, presented to the ED on 03/01/2022 with complaints of chest pain.  Cardiology consulted, s/p coronary CTA, mild, nonobstructive disease.  Chest pain felt to be nonischemic. ? ?Assessment and Plan: ?* Chest pain- w atypical and typical features ?Multiple CAD risk factors: DM with peripheral neuropathy, multiple amputations, HTN, family history (mother with history of MI in her 108s), tobacco use disorder ?Typical and atypical  features. ?High-sensitivity troponins x2 negative.  Chest x-ray negative. ?Partially reproducible to palpation. ?Treated with aspirin and as needed sublingual NTG. ?Cardiology consultation and signoff appreciated.  Coronary CTA showed mild, nonobstructive disease.  Patient was counseled extensively regarding management of his risk factors.  Cardiology recommended against starting aspirin due to concern for gastritis.  Also no statins in the context of ongoing alcohol use disorder but may benefit if he quits alcohol.  Cardiology cleared him for discharge. ? ?Type 2 diabetes mellitus with peripheral neuropathy ([place]) ?A1c 5.8 on 2/4, well controlled ?Significant peripheral neuropathy. ?Has not been taking any of his prescription meds at home.  Despite this his diabetes is well controlled. ? ?HTN (hypertension) ?It appears that patient has not been taking any of his meds at home. ?Cardiology has initiated him on HCTZ and valsartan and recommend discharging on the same simple regimen and patient has been counseled regarding compliance with all meds. ?Blood pressure is better. ? ?Hyperlipidemia associated with type 2 diabetes mellitus ([place]) ?Not on meds PTA. ? ?Tobacco abuse ?Cessation counseled ? ?Alcohol use disorder ?Patient reports drinking 1/5 of liquor daily and the last time was on the night of admission. ?No withdrawal features. ?Continue multivitamins, thiamine and folate ?Counseled extensively regarding moderation and eventual cessation. ?TOC consulted for substance abuse counseling and outpatient resources.  He reportedly is struggled a lot since the death of his wife 3 years ago but now wishes to take better care of himself. ? ? ?Body mass index is 35.95  kg/m?. ? ? ?Consultations: ?Cardiology ? ?Procedures: ?None ? ?Discharge Instructions ?Discharge Instructions   ? ? Call MD for:  difficulty breathing, headache or visual disturbances   Complete by: As directed ?  ? Call MD for:  extreme fatigue   Complete by:  As directed ?  ? Call MD for:  persistant dizziness or light-headedness   Complete by: As directed ?  ? Call MD for:  severe uncontrolled pain   Complete by: As directed ?  ? Diet - low sodium heart healthy   Complete by: As directed ?  ? Increase activity slowly   Complete by: As directed ?  ? ?  ? ?  ?Medication List  ?  ? ?STOP taking these medications   ? ?ALPRAZolam 1 MG tablet ?Commonly known as: XANAX ?  ?amLODipine 10 MG tablet ?Commonly known as: NORVASC ?  ?aspirin 81 MG EC tablet ?  ?ferrous sulfate 325 (65 FE) MG tablet ?  ?gabapentin 300 MG capsule ?Commonly known as: NEURONTIN ?  ?glipiZIDE 5 MG tablet ?Commonly known as: GLUCOTROL ?  ?hydrALAZINE 50 MG tablet ?Commonly known as: APRESOLINE ?  ?lisinopril 40 MG tablet ?Commonly known as: ZESTRIL ?  ?metFORMIN 500 MG tablet ?Commonly known as: Glucophage ?  ?mirtazapine 7.5 MG tablet ?Commonly known as: REMERON ?  ?morphine 30 MG 12 hr tablet ?Commonly known as: MS CONTIN ?  ?oxycodone 30 MG immediate release tablet ?Commonly known as: ROXICODONE ?  ?silver sulfADIAZINE 1 % cream ?Commonly known as: Silvadene ?  ? ?  ? ?TAKE these medications   ? ?acetaminophen 325 MG tablet ?Commonly known as: TYLENOL ?Take 2 tablets (650 mg total) by mouth every 6 (six) hours as needed for mild pain. ?What changed: reasons to take this ?  ?diclofenac Sodium 1 % Gel ?Commonly known as: VOLTAREN ?Apply 4 g topically 4 (four) times daily as needed (Apply to painful area over left upper front chest.). ?  ?folic acid 1 MG tablet ?Commonly known as: FOLVITE ?Take 1 tablet (1 mg total) by mouth daily. ?  ?hydrochlorothiazide 12.5 MG tablet ?Commonly known as: HYDRODIURIL ?Take 1 tablet (12.5 mg total) by mouth daily. ?Start taking on: Mar 04, 2022 ?  ?irbesartan 150 MG tablet ?Commonly known as: AVAPRO ?Take 1 tablet (150 mg total) by mouth daily. ?Start taking on: Mar 04, 2022 ?  ?multivitamin with minerals Tabs tablet ?Take 1 tablet by mouth daily. ?Start taking on: Mar 04, 2022 ?  ?thiamine 100 MG tablet ?Take 1 tablet (100 mg total) by mouth daily. ?Start taking on: Mar 04, 2022 ?  ? ?  ? ?No Known Allergies ?Procedures/Studies: ?CT CORONARY MORPH W/CTA COR W/SCORE W/CA W/CM &/OR WO/CM ? ?Addendum Date: 03/02/2022   ?ADDENDUM REPORT: 03/02/2022 14:03 CLINICAL DATA:  Chest pain EXAM: Cardiac CTA MEDICATIONS: Sub lingual nitro. '4mg'$  x 2 TECHNIQUE: The patient was scanned on a Siemens 854 slice scanner. Gantry rotation speed was 250 msecs. Collimation was 0.6 mm. A 100 kV prospective scan was triggered in the ascending thoracic aorta at 35-75% of the R-R interval. Average HR during the scan was 60 bpm. The 3D data set was interpreted on a dedicated work station using MPR, MIP and VRT modes. A total of 80cc of contrast was used. FINDINGS: Non-cardiac: See separate report from [facility]. Pulmonary veins drain normally to the left atrium. No LA appendage thrombus. Calcium Score: 94 Agatston units. Coronary Arteries: Right dominant with no anomalies LM: No plaque or stenosis. LAD system:  Mixed plaque ostial LAD, mild (1-24%) stenosis. Large D1 with no significant disease. Circumflex system: Mixed plaque mid LCx, mild (1-24%) stenosis. RCA system: Mixed plaque proximal and mid RCA, mild (1-24%) stenosis. IMPRESSION: 1. Coronary artery calcium score 94 Agatston units. This places the patient in the 74th percentile for age and gender, suggesting intermediate risk for future cardiac events. 2.  Nonobstructive mild CAD. Dalton Mclean Electronically Signed   By: Loralie Champagne M.D.   On: 03/02/2022 14:03  ? ?Result Date: 03/02/2022 ?EXAM: OVER-READ INTERPRETATION  CT CHEST The following report is an over-read performed by radiologist Dr. Zetta Bills of [facility], [place] on 03/02/2022. This over-read does not include interpretation of cardiac or coronary anatomy or pathology. The coronary calcium score/coronary CTAw interpretation by the cardiologist is attached. COMPARISON:  None  available aside from prior chest radiographs. FINDINGS: Vascular: See dedicated cardiovascular imaging report for details regarding cardiovascular structures. Mediastinum/Nodes: No acute process or adenopathy

## 2022-03-03 NOTE — Progress Notes (Signed)
?  Echocardiogram ?2D Echocardiogram has been performed. ? ?Eugene Diaz ?03/03/2022, 8:16 AM ?

## 2022-03-03 NOTE — Progress Notes (Signed)
? ?Progress Note ? ?Patient Name: Eugene Diaz ?Date of Encounter: 03/03/2022 ? ?[facility] Cardiologist: None  ? ?Subjective  ? ?Feeling OK.  Still has some chest discomfort ? ?Inpatient Medications  ?  ?Scheduled Meds: ? aspirin EC  81 mg Oral Daily  ? carvedilol  6.25 mg Oral BID WC  ? diclofenac Sodium  4 g Topical TID AC & HS  ? enoxaparin (LOVENOX) injection  40 mg Subcutaneous C78L  ? folic acid  1 mg Oral Daily  ? insulin aspart  0-9 Units Subcutaneous Q4H  ? irbesartan  150 mg Oral Daily  ? multivitamin with minerals  1 tablet Oral Daily  ? thiamine  100 mg Oral Daily  ? Or  ? thiamine  100 mg Intravenous Daily  ? ?Continuous Infusions: ? ?PRN Meds: ?acetaminophen **OR** acetaminophen, hydrALAZINE, LORazepam **OR** LORazepam, nitroGLYCERIN, perflutren lipid microspheres (DEFINITY) IV suspension  ? ?Vital Signs  ?  ?Vitals:  ? 03/03/22 0415 03/03/22 0512 03/03/22 0900 03/03/22 0939  ?BP: (!) 174/84 (!) 149/69 (!) 159/72 (!) 145/69  ?Pulse: 64 (!) 59 96 84  ?Resp: 13 18 (!) 21 15  ?Temp:      ?TempSrc:      ?SpO2: 97% 95% 97% 95%  ?Weight:      ?Height:      ? ? ?Intake/Output Summary (Last 24 hours) at 03/03/2022 1009 ?Last data filed at 03/03/2022 0422 ?Gross per 24 hour  ?Intake --  ?Output 325 ml  ?Net -325 ml  ? ? ?  03/02/2022  ?  5:45 AM 12/04/2021  ? 12:04 PM 12/03/2021  ?  3:47 PM  ?Last 3 Weights  ?Weight (lbs) 280 lb 279 lb 15.8 oz 279 lb 15.8 oz  ?Weight (kg) 127.007 kg 127 kg 127 kg  ?   ? ?Telemetry  ?  ?Sinus rhythm.  PVCs - Personally Reviewed ? ?ECG  ?  ?N/a - Personally Reviewed ? ?Physical Exam  ? ?VS:  BP (!) 145/69 (BP Location: Left Arm)   Pulse 84   Temp 97.7 ?F (36.5 ?C) (Oral)   Resp 15   Ht '6\' 2"'$  (1.88 m)   Wt 127 kg   SpO2 95%   BMI 35.95 kg/m?  , BMI Body mass index is 35.95 kg/m?. ?GENERAL:  Well appearing ?HEENT: Pupils equal round and reactive, fundi not visualized, oral mucosa unremarkable ?NECK:  No jugular venous distention, waveform within normal limits, carotid upstroke  brisk and symmetric, no bruits, no thyromegaly ?LUNGS:  Clear to auscultation bilaterally ?HEART:  RRR.  PMI not displaced or sustained,S1 and S2 within normal limits, no S3, no S4, no clicks, no rubs, no murmurs ?ABD:  Flat, positive bowel sounds normal in frequency in pitch, no bruits, no rebound, no guarding, no midline pulsatile mass, no hepatomegaly, no splenomegaly ?EXT:  R TMA.  L BKA. ?SKIN:  No rashes no nodules ?NEURO:  Cranial nerves II through XII grossly intact, motor grossly intact throughout ?PSYCH:  Cognitively intact, oriented to person place and time ? ? ?Labs  ?  ?High Sensitivity Troponin:   ?Recent Labs  ?Lab 03/02/22 ?3810 03/02/22 ?0345  ?TROPONINIHS 7 7  ?   ?Chemistry ?Recent Labs  ?Lab 03/02/22 ?1751  ?NA 143  ?K 3.6  ?CL 113*  ?CO2 20*  ?GLUCOSE 128*  ?BUN 9  ?CREATININE 0.77  ?CALCIUM 8.8*  ?GFRNONAA >60  ?ANIONGAP 10  ?  ?Lipids  ?Recent Labs  ?Lab 03/02/22 ?0957  ?CHOL 128  ?TRIG 68  ?  HDL 39*  ?[place] 75  ?CHOLHDL 3.3  ?  ?Hematology ?Recent Labs  ?Lab 03/02/22 ?1833  ?WBC 7.1  ?RBC 5.23  ?HGB 16.3  ?HCT 46.3  ?MCV 88.5  ?MCH 31.2  ?MCHC 35.2  ?RDW 14.2  ?PLT 178  ? ?Thyroid  ?Recent Labs  ?Lab 03/02/22 ?0957  ?TSH 0.570  ?  ?BNPNo results for input(s): BNP, PROBNP in the last 168 hours.  ?DDimer No results for input(s): DDIMER in the last 168 hours.  ? ?Radiology  ?  ?CT CORONARY MORPH W/CTA COR W/SCORE W/CA W/CM &/OR WO/CM ? ?Addendum Date: 03/02/2022   ?ADDENDUM REPORT: 03/02/2022 14:03 CLINICAL DATA:  Chest pain EXAM: Cardiac CTA MEDICATIONS: Sub lingual nitro. '4mg'$  x 2 TECHNIQUE: The patient was scanned on a Siemens 582 slice scanner. Gantry rotation speed was 250 msecs. Collimation was 0.6 mm. A 100 kV prospective scan was triggered in the ascending thoracic aorta at 35-75% of the R-R interval. Average HR during the scan was 60 bpm. The 3D data set was interpreted on a dedicated work station using MPR, MIP and VRT modes. A total of 80cc of contrast was used. FINDINGS: Non-cardiac:  See separate report from [facility]. Pulmonary veins drain normally to the left atrium. No LA appendage thrombus. Calcium Score: 94 Agatston units. Coronary Arteries: Right dominant with no anomalies LM: No plaque or stenosis. LAD system: Mixed plaque ostial LAD, mild (1-24%) stenosis. Large D1 with no significant disease. Circumflex system: Mixed plaque mid LCx, mild (1-24%) stenosis. RCA system: Mixed plaque proximal and mid RCA, mild (1-24%) stenosis. IMPRESSION: 1. Coronary artery calcium score 94 Agatston units. This places the patient in the 74th percentile for age and gender, suggesting intermediate risk for future cardiac events. 2.  Nonobstructive mild CAD. Dalton Mclean Electronically Signed   By: Loralie Champagne M.D.   On: 03/02/2022 14:03  ? ?Result Date: 03/02/2022 ?EXAM: OVER-READ INTERPRETATION  CT CHEST The following report is an over-read performed by radiologist Dr. Zetta Bills of [facility], [place] on 03/02/2022. This over-read does not include interpretation of cardiac or coronary anatomy or pathology. The coronary calcium score/coronary CTAw interpretation by the cardiologist is attached. COMPARISON:  None available aside from prior chest radiographs. FINDINGS: Vascular: See dedicated cardiovascular imaging report for details regarding cardiovascular structures. Mediastinum/Nodes: No acute process or adenopathy in the visualized portions of the mediastinum. Lungs/Pleura: Visualized airways are patent. Visualized lungs are clear. Upper Abdomen: Incidental imaging of upper abdominal contents shows no acute process. Only the very upper most portion of the abdomen is imaged on the current study. Musculoskeletal: Signs of healed rib fractures in the bilateral chest. No acute or destructive bone finding with spinal degenerative changes. IMPRESSION: No acute or significant extracardiac findings. Electronically Signed: By: Zetta Bills M.D. On: 03/02/2022 12:58  ? ?DG Chest Port 1  View ? ?Result Date: 03/01/2022 ?CLINICAL DATA:  Chest pain EXAM: PORTABLE CHEST 1 VIEW COMPARISON:  03/27/2021 FINDINGS: The heart size and mediastinal contours are within normal limits. Both lungs are clear. The visualized skeletal structures are unremarkable. IMPRESSION: No active disease. Electronically Signed   By: Inez Catalina M.D.   On: 03/01/2022 23:59  ? ?ECHOCARDIOGRAM COMPLETE ? ?Result Date: 03/03/2022 ?   ECHOCARDIOGRAM REPORT   Patient Name:   Eugene Diaz Date of Exam: 03/03/2022 Medical Rec #:  [id number]       Height:       74.0 in Accession #:    [id number]  Weight:       280.0 lb Date of Birth:  Dec 23, 1965       BSA:          2.508 m? Patient Age:    71 years        BP:           149/69 mmHg Patient Gender: M               HR:           59 bpm. Exam Location:  Inpatient Procedure: 2D Echo, Cardiac Doppler, Color Doppler and Intracardiac            Opacification Agent Indications:    Chest pain  History:        Patient has prior history of Echocardiogram examinations, most                 recent 03/11/2014. Risk Factors:Hypertension, Diabetes,                 Dyslipidemia and Current Smoker. ETOH abuse.  Sonographer:    Clayton Lefort RDCS (AE) Referring Phys: [place]  Sonographer Comments: Technically difficult study due to poor echo windows. IMPRESSIONS  1. Apex is hypokinetic. Left ventricular ejection fraction, by estimation, is 50 to 55%. The left ventricle has low normal function. The left ventricle demonstrates regional wall motion abnormalities (see scoring diagram/findings for description). There  is moderate left ventricular hypertrophy. Left ventricular diastolic parameters are consistent with Grade II diastolic dysfunction (pseudonormalization).  2. Right ventricular systolic function is normal. The right ventricular size is normal.  3. The mitral valve was not well visualized. No evidence of mitral valve regurgitation.  4. There is mild calcification of the aortic valve.  Aortic valve regurgitation is not visualized.  5. The inferior vena cava is normal in size with greater than 50% respiratory variability, suggesting right atrial pressure of 3 mmHg. Comparison(s): No prior Music therapist

## 2022-03-08 ENCOUNTER — Ambulatory Visit (INDEPENDENT_AMBULATORY_CARE_PROVIDER_SITE_OTHER): Payer: Medicare HMO | Admitting: Internal Medicine

## 2022-03-08 ENCOUNTER — Encounter: Payer: Self-pay | Admitting: Internal Medicine

## 2022-03-08 DIAGNOSIS — I10 Essential (primary) hypertension: Secondary | ICD-10-CM

## 2022-03-08 DIAGNOSIS — Z87891 Personal history of nicotine dependence: Secondary | ICD-10-CM | POA: Diagnosis not present

## 2022-03-08 DIAGNOSIS — F418 Other specified anxiety disorders: Secondary | ICD-10-CM

## 2022-03-08 DIAGNOSIS — R072 Precordial pain: Secondary | ICD-10-CM

## 2022-03-08 NOTE — Patient Instructions (Signed)
Thank you, [name] for allowing [place] to provide your care today. Today we discussed: ? ?Trip to the ED and Hospitalization for chest pain: After reviewing the documents from your recent hospitalization, it is reassuring to see that you are not having a heart attack at that time.  They did see evidence that you may have had a heart attack in the past on your EKG.  They also noted a lot of calcification of the blood vessels around the heart.  Given your risk factors of high blood pressure, high cholesterol, type 2 diabetes you are at risk for having a heart attack in the future.  I am glad that when he felt the chest pain you contacted emergency services since this can be very serious.  Likely this time your chest pain may have been due to heartburn or muscle pain. ? ?I recommend that you take your Lipitor for your high cholesterol and your metformin for your diabetes.  Seems you are also on blood pressure medications, though your blood pressure does look good today 117/68 despite not taking medications.  I will check in with your doctor's office to verify which medicines you are currently on and use the pillbox that we have provided to keep track of morning and evening medications. ? ?I will call your office to ask if you can have an earlier appointment with your primary care physician.  You can let them know that you were recently in the hospital in [place] and would like to be seen sooner. ? ?My Chart Access: ?[email or web address] ? ?Please follow-up with our clinic as needed.  ? ?Please make sure to arrive 15 minutes prior to your next appointment. If you arrive late, you may be asked to reschedule.  ?  ?We look forward to seeing you next time. Please call our clinic at [phone] if you have any questions or concerns. The best time to call is Monday-Friday from 9am-4pm, but there is someone available 24/7. If after hours or the weekend, call the main hospital  number and ask for the Internal Medicine Resident On-Call. If you need medication refills, please notify your pharmacy one week in advance and they will send [place] a request. ?  ?Thank you for letting us take part in your care. Wishing you the best! ? ?Wayland Denis, MD ?03/08/2022, 10:26 AM ?IM Resident, PGY-1 ? ?

## 2022-03-08 NOTE — Assessment & Plan Note (Signed)
PHQ-9 22.  Patient endorses depressed mood following the death of his wife and now living alone [place] far from other friends.  He endorses sister-in-law as one of his best friends who watches out for him. Patient reports he has discussed his symptoms with his PCP in the past.  Encouraged patient to continue to discuss this with his primary care physician. ?

## 2022-03-08 NOTE — Assessment & Plan Note (Addendum)
Patient presents for hospital follow-up appointment.  He presented to the ED on 5/02 for chest pain with atypical and typical features.  Patient was seated in his recliner watching TV when he had sudden onset left anterior chest wall pain, nonradiating, pressure-like, no relation to exertion, no associated shortness of breath, diaphoresis, abdominal pain, nausea or vomiting. Patient treated with aspirin, EKG, chest x-ray, cardiac markers unremarkable.  Patient was admitted for ACS rule out due to risk factors including diabetes, tobacco abuse, hypertension.  Cardiology was consulted.  Coronary CTA obtained and showed mild, nonobstructive disease.  Echo showed hypokinetic apex, EF 50-55%, moderate LVH, grade 2 diastolic dysfunction.  Chest pain was partially reproducible to palpation.  Patient given Voltaren gel at discharge.  Was not prescribed aspirin at discharge due to concern for gastritis. ? ?Today patient reports he feels well, no further episodes of chest pain.  He has been using Voltaren gel on his chest and has had no further symptoms.  Patient follows with Mr. Collene Mares and Dr. Milderd Meager for his primary care.  Patient reports he has "a bucket" of medications at home but he does not take these.  Patient reports Lipitor is 1 of these medications.  Discussed with patient that there is evidence on his EKG that he may have had heart attack in the past.  Patient is motivated to "get himself straight".  Discussed importance of taking his medications for his high blood pressure, high cholesterol, type 2 diabetes noted to prevent cardiovascular events in the future.  Patient's sister-in-law who is present reports that patient has a follow-up appointment with his PCP in July.  Discussed patient may want to call their office and let them know that he was recently hospitalized and needs an earlier appointment to discuss his medications.  Discussed mimics of chest pain and their management.  Reassured patient  that calling the ambulance was the right thing to do for his chest pain to rule out serious cardiovascular events. Patient was provided with a pillbox and medication bag today to make managing his medications easier. ?Plan: ?-Follow-up with PCP ?-Take medications to manage risk factors to prevent cardiovascular events in the future ?-Provided with educational information on chest pain ?

## 2022-03-08 NOTE — Progress Notes (Signed)
?CC: ED follow up ? ?HPI: ? ?[name] is a 56 y.o. male with a past medical history stated below and presents today for ED follow up. Please see problem based assessment and plan for additional details. ? ?Past Medical History:  ?Diagnosis Date  ? Alcohol abuse   ? Alcoholic peripheral neuropathy ([place])   ? Anxiety   ? Arthritis   ? Asthma   ? followed by pcp  ? B12 deficiency   ? Depression   ? Diabetic neuropathy ([place])   ? Edema of both lower extremities   ? Essential tremor   ? neurologist-- dr patel--- due to alcohol abuse  ? GERD (gastroesophageal reflux disease)   ? History of cellulitis 2020  ? left lower leg;   recurrent 03-25-2020  ? History of esophageal stricture   ? s/p  dilatation 02-13-2018  ? History of osteomyelitis 2020  ? left great toe    ? Hypercholesterolemia   ? Hypertension   ? followed by pcp  (nuclear stress test 03-11-2014 low risk w/ no ischemia, ef 65%  ? Normocytic anemia   ? followed by pcp   (03-18-2020 had transfusion's 02/ 2021)  ? Osteomyelitis of great toe of right foot ([place]) 08/18/2021  ? Peripheral vascular disease ([place])   ? Severe depression ([place]) 06/09/2021  ? Status post incision and drainage followed by Dr [name] and pcp  ? 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 Suicidal ideation 06/09/2021  ? Thrombocytopenia ([place])   ? chronic  ? Type 2 diabetes mellitus ([place])   ? followed by pcp---    (03-18-2020  pt stated does not check blood sugar)  ? ? ?Current Outpatient Medications on File Prior to Visit  ?Medication Sig Dispense Refill  ? acetaminophen (TYLENOL) 325 MG tablet Take 2 tablets (650 mg total) by mouth every 6 (six) hours as needed for mild pain.    ? diclofenac Sodium (VOLTAREN) 1 % GEL Apply 4 g topically 4 (four) times daily as needed (Apply to painful area  over left upper front chest.). 50 g 0  ? folic acid (FOLVITE) 1 MG tablet Take 1 tablet (1 mg total) by mouth daily. 30 tablet 0  ? hydrochlorothiazide (HYDRODIURIL) 12.5 MG tablet Take 1 tablet (12.5 mg total) by mouth daily. 30 tablet 1  ? irbesartan (AVAPRO) 150 MG tablet Take 1 tablet (150 mg total) by mouth daily. 30 tablet 1  ? Multiple Vitamin (MULTIVITAMIN WITH MINERALS) TABS tablet Take 1 tablet by mouth daily.    ? thiamine 100 MG tablet Take 1 tablet (100 mg total) by mouth daily. 30 tablet 0  ? ?No current facility-administered medications on file prior to visit.  ? ? ?Family History  ?Problem Relation Age of Onset  ? Heart attack Mother   ?     Living, 74  ? Cancer Father   ?     Deceased  ? Healthy Brother   ? Healthy Sister   ? Colon cancer Neg Hx   ? Colon polyps Neg Hx   ? ?Social Hx: Patient lives alone in [place].  His sister-in-law brings him to his medical appointments.  His wife passed away several years ago. ?Social History  ? ?Tobacco Use  ? Smoking status:  Former  ?  Packs/day: 2.50  ?  Years: 25.00  ?  Pack years: 62.50  ?  Types: Cigarettes  ?  Quit date: 08/05/1984  ?  Years since quitting: 37.6  ? Smokeless tobacco: Never  ?Vaping Use  ? Vaping Use: Never used  ?Substance Use Topics  ? Alcohol use: Not Currently  ?  Alcohol/week: 50.0 standard drinks  ?  Types: 50 Standard drinks or equivalent per week  ?  Comment: Drinks 6 beers, liquor 4-6oz (both daily) x 35 years  ? Drug use: Not Currently  ?  Types: Marijuana  ? ?Review of Systems: ?ROS negative except for what is noted on the assessment and plan. ? ?Vitals:  ? 03/08/22 0906  ?BP: 117/68  ?Pulse: 96  ?Temp: 98 ?F (36.7 ?C)  ?TempSrc: Oral  ?SpO2: 100%  ?Weight: 238 lb 3.2 oz (108 kg)  ?Height: '6\' 2"'$  (1.88 m)  ? ? ? ?Physical Exam: ?General: Well appearing elderly caucasian male, NAD ?HENT: normocephalic, atraumatic, bearded ?EYES: conjunctiva non-erythematous, no scleral icterus ?CV: regular rate, normal rhythm, no  murmurs, rubs, gallops. Trace pitting edema bilateral lower extremities. ?Pulmonary: normal work of breathing on RA, lungs clear to auscultation, no rales, wheezes, rhonchi ?Abdominal: non-distended, soft, non-tender to palpation, normal BS ?Skin: Warm and dry, no rashes or lesions on exposed surfaces ?Neurological: ?MS: awake, alert and oriented x3, normal speech and fund of knowledge ?Motor: moves all extremities antigravity ?Psych: normal affect ? ? ? ?Assessment & Plan:  ? ?See Encounters Tab for problem based charting. ? ?Patient discussed with Dr. Philipp Ovens ? ?Wayland Denis, M.D. ?[facility] Internal Medicine, PGY-1 ?Pager: [phone] ?Date 03/08/2022 Time 10:26 AM ? ?

## 2022-03-08 NOTE — Assessment & Plan Note (Signed)
Blood pressure today 117/68.  Patient reports he has blood pressure medication prescribed by his PCP however he has not been taking this.  He is unsure which medications he is on.  Encourage patient to follow-up with his PCP regarding his medication regimen, though currently his blood pressures are well controlled despite not raking antihypertensives. Patient may not need daily antihypertensives at this time.  ?

## 2022-03-09 NOTE — Progress Notes (Signed)
Internal Medicine Clinic Attending ? ?Case discussed with Dr. Zinoviev  At the time of the visit.  We reviewed the resident?s history and exam and pertinent patient test results.  I agree with the assessment, diagnosis, and plan of care documented in the resident?s note.  ?

## 2022-04-06 ENCOUNTER — Encounter: Payer: Self-pay | Admitting: Dietician

## 2022-05-11 DIAGNOSIS — E1140 Type 2 diabetes mellitus with diabetic neuropathy, unspecified: Secondary | ICD-10-CM | POA: Diagnosis not present

## 2022-05-11 DIAGNOSIS — G894 Chronic pain syndrome: Secondary | ICD-10-CM | POA: Diagnosis not present

## 2022-05-11 DIAGNOSIS — M1991 Primary osteoarthritis, unspecified site: Secondary | ICD-10-CM | POA: Diagnosis not present

## 2022-05-11 DIAGNOSIS — Z89421 Acquired absence of other right toe(s): Secondary | ICD-10-CM | POA: Diagnosis not present

## 2022-05-11 DIAGNOSIS — G546 Phantom limb syndrome with pain: Secondary | ICD-10-CM | POA: Diagnosis not present

## 2022-05-11 DIAGNOSIS — Z89512 Acquired absence of left leg below knee: Secondary | ICD-10-CM | POA: Diagnosis not present

## 2022-05-20 ENCOUNTER — Emergency Department (HOSPITAL_COMMUNITY): Payer: Medicare Other

## 2022-05-20 ENCOUNTER — Inpatient Hospital Stay (HOSPITAL_COMMUNITY)
Admission: EM | Admit: 2022-05-20 | Discharge: 2022-05-24 | DRG: 638 | Disposition: A | Discharge status: Home Health | Payer: Medicare Other | Attending: Family Medicine | Admitting: Family Medicine

## 2022-05-20 ENCOUNTER — Encounter (HOSPITAL_COMMUNITY): Payer: Self-pay | Admitting: Emergency Medicine

## 2022-05-20 DIAGNOSIS — E11621 Type 2 diabetes mellitus with foot ulcer: Secondary | ICD-10-CM | POA: Diagnosis not present

## 2022-05-20 DIAGNOSIS — E7800 Pure hypercholesterolemia, unspecified: Secondary | ICD-10-CM | POA: Diagnosis present

## 2022-05-20 DIAGNOSIS — R29818 Other symptoms and signs involving the nervous system: Secondary | ICD-10-CM | POA: Diagnosis not present

## 2022-05-20 DIAGNOSIS — E1142 Type 2 diabetes mellitus with diabetic polyneuropathy: Secondary | ICD-10-CM | POA: Diagnosis present

## 2022-05-20 DIAGNOSIS — W450XXA Nail entering through skin, initial encounter: Secondary | ICD-10-CM

## 2022-05-20 DIAGNOSIS — F1010 Alcohol abuse, uncomplicated: Secondary | ICD-10-CM | POA: Diagnosis present

## 2022-05-20 DIAGNOSIS — E538 Deficiency of other specified B group vitamins: Secondary | ICD-10-CM | POA: Diagnosis present

## 2022-05-20 DIAGNOSIS — T148XXA Other injury of unspecified body region, initial encounter: Secondary | ICD-10-CM | POA: Diagnosis not present

## 2022-05-20 DIAGNOSIS — K219 Gastro-esophageal reflux disease without esophagitis: Secondary | ICD-10-CM | POA: Diagnosis present

## 2022-05-20 DIAGNOSIS — M7989 Other specified soft tissue disorders: Secondary | ICD-10-CM | POA: Diagnosis not present

## 2022-05-20 DIAGNOSIS — L089 Local infection of the skin and subcutaneous tissue, unspecified: Principal | ICD-10-CM | POA: Diagnosis present

## 2022-05-20 DIAGNOSIS — D638 Anemia in other chronic diseases classified elsewhere: Secondary | ICD-10-CM | POA: Diagnosis present

## 2022-05-20 DIAGNOSIS — L98499 Non-pressure chronic ulcer of skin of other sites with unspecified severity: Secondary | ICD-10-CM | POA: Diagnosis not present

## 2022-05-20 DIAGNOSIS — E119 Type 2 diabetes mellitus without complications: Secondary | ICD-10-CM

## 2022-05-20 DIAGNOSIS — L03115 Cellulitis of right lower limb: Secondary | ICD-10-CM | POA: Diagnosis present

## 2022-05-20 DIAGNOSIS — I739 Peripheral vascular disease, unspecified: Secondary | ICD-10-CM | POA: Diagnosis not present

## 2022-05-20 DIAGNOSIS — J3489 Other specified disorders of nose and nasal sinuses: Secondary | ICD-10-CM | POA: Diagnosis not present

## 2022-05-20 DIAGNOSIS — E1141 Type 2 diabetes mellitus with diabetic mononeuropathy: Secondary | ICD-10-CM | POA: Diagnosis not present

## 2022-05-20 DIAGNOSIS — M79671 Pain in right foot: Secondary | ICD-10-CM | POA: Diagnosis not present

## 2022-05-20 DIAGNOSIS — E785 Hyperlipidemia, unspecified: Secondary | ICD-10-CM | POA: Diagnosis not present

## 2022-05-20 DIAGNOSIS — M79661 Pain in right lower leg: Secondary | ICD-10-CM | POA: Diagnosis not present

## 2022-05-20 DIAGNOSIS — E1140 Type 2 diabetes mellitus with diabetic neuropathy, unspecified: Secondary | ICD-10-CM | POA: Diagnosis present

## 2022-05-20 DIAGNOSIS — Z89512 Acquired absence of left leg below knee: Secondary | ICD-10-CM | POA: Diagnosis not present

## 2022-05-20 DIAGNOSIS — L97519 Non-pressure chronic ulcer of other part of right foot with unspecified severity: Secondary | ICD-10-CM | POA: Diagnosis not present

## 2022-05-20 DIAGNOSIS — A419 Sepsis, unspecified organism: Secondary | ICD-10-CM | POA: Diagnosis not present

## 2022-05-20 DIAGNOSIS — M1990 Unspecified osteoarthritis, unspecified site: Secondary | ICD-10-CM | POA: Diagnosis present

## 2022-05-20 DIAGNOSIS — E1169 Type 2 diabetes mellitus with other specified complication: Secondary | ICD-10-CM | POA: Diagnosis present

## 2022-05-20 DIAGNOSIS — L97509 Non-pressure chronic ulcer of other part of unspecified foot with unspecified severity: Secondary | ICD-10-CM | POA: Diagnosis not present

## 2022-05-20 DIAGNOSIS — Z794 Long term (current) use of insulin: Secondary | ICD-10-CM | POA: Diagnosis not present

## 2022-05-20 DIAGNOSIS — F32A Depression, unspecified: Secondary | ICD-10-CM | POA: Diagnosis present

## 2022-05-20 DIAGNOSIS — Z91138 Patient's unintentional underdosing of medication regimen for other reason: Secondary | ICD-10-CM

## 2022-05-20 DIAGNOSIS — T465X6A Underdosing of other antihypertensive drugs, initial encounter: Secondary | ICD-10-CM | POA: Diagnosis present

## 2022-05-20 DIAGNOSIS — I10 Essential (primary) hypertension: Secondary | ICD-10-CM | POA: Diagnosis present

## 2022-05-20 DIAGNOSIS — E1151 Type 2 diabetes mellitus with diabetic peripheral angiopathy without gangrene: Secondary | ICD-10-CM | POA: Diagnosis present

## 2022-05-20 DIAGNOSIS — T502X6A Underdosing of carbonic-anhydrase inhibitors, benzothiadiazides and other diuretics, initial encounter: Secondary | ICD-10-CM | POA: Diagnosis present

## 2022-05-20 DIAGNOSIS — G621 Alcoholic polyneuropathy: Secondary | ICD-10-CM | POA: Diagnosis present

## 2022-05-20 DIAGNOSIS — T464X6A Underdosing of angiotensin-converting-enzyme inhibitors, initial encounter: Secondary | ICD-10-CM | POA: Diagnosis present

## 2022-05-20 DIAGNOSIS — G894 Chronic pain syndrome: Secondary | ICD-10-CM | POA: Diagnosis present

## 2022-05-20 DIAGNOSIS — M609 Myositis, unspecified: Secondary | ICD-10-CM | POA: Diagnosis not present

## 2022-05-20 DIAGNOSIS — Z8249 Family history of ischemic heart disease and other diseases of the circulatory system: Secondary | ICD-10-CM

## 2022-05-20 DIAGNOSIS — Z79899 Other long term (current) drug therapy: Secondary | ICD-10-CM

## 2022-05-20 DIAGNOSIS — Z89431 Acquired absence of right foot: Secondary | ICD-10-CM

## 2022-05-20 DIAGNOSIS — F419 Anxiety disorder, unspecified: Secondary | ICD-10-CM | POA: Diagnosis present

## 2022-05-20 DIAGNOSIS — E0849 Diabetes mellitus due to underlying condition with other diabetic neurological complication: Secondary | ICD-10-CM | POA: Diagnosis not present

## 2022-05-20 DIAGNOSIS — E669 Obesity, unspecified: Secondary | ICD-10-CM | POA: Diagnosis present

## 2022-05-20 DIAGNOSIS — S91331A Puncture wound without foreign body, right foot, initial encounter: Secondary | ICD-10-CM | POA: Diagnosis present

## 2022-05-20 DIAGNOSIS — S91301A Unspecified open wound, right foot, initial encounter: Secondary | ICD-10-CM | POA: Diagnosis not present

## 2022-05-20 DIAGNOSIS — L97516 Non-pressure chronic ulcer of other part of right foot with bone involvement without evidence of necrosis: Secondary | ICD-10-CM | POA: Diagnosis present

## 2022-05-20 DIAGNOSIS — Z7984 Long term (current) use of oral hypoglycemic drugs: Secondary | ICD-10-CM | POA: Diagnosis not present

## 2022-05-20 DIAGNOSIS — R29898 Other symptoms and signs involving the musculoskeletal system: Secondary | ICD-10-CM | POA: Diagnosis present

## 2022-05-20 DIAGNOSIS — E11628 Type 2 diabetes mellitus with other skin complications: Principal | ICD-10-CM | POA: Diagnosis present

## 2022-05-20 DIAGNOSIS — F17200 Nicotine dependence, unspecified, uncomplicated: Secondary | ICD-10-CM | POA: Diagnosis present

## 2022-05-20 DIAGNOSIS — Z791 Long term (current) use of non-steroidal anti-inflammatories (NSAID): Secondary | ICD-10-CM

## 2022-05-20 DIAGNOSIS — E1111 Type 2 diabetes mellitus with ketoacidosis with coma: Secondary | ICD-10-CM | POA: Diagnosis not present

## 2022-05-20 DIAGNOSIS — Z6834 Body mass index (BMI) 34.0-34.9, adult: Secondary | ICD-10-CM

## 2022-05-20 DIAGNOSIS — Z9889 Other specified postprocedural states: Secondary | ICD-10-CM | POA: Diagnosis not present

## 2022-05-20 DIAGNOSIS — M86371 Chronic multifocal osteomyelitis, right ankle and foot: Secondary | ICD-10-CM | POA: Diagnosis not present

## 2022-05-20 DIAGNOSIS — M869 Osteomyelitis, unspecified: Secondary | ICD-10-CM | POA: Diagnosis not present

## 2022-05-20 DIAGNOSIS — R9431 Abnormal electrocardiogram [ECG] [EKG]: Secondary | ICD-10-CM | POA: Diagnosis not present

## 2022-05-20 LAB — CBC WITH DIFFERENTIAL/PLATELET
Abs Immature Granulocytes: 0.04 10*3/uL (ref 0.00–0.07)
Basophils Absolute: 0.1 10*3/uL (ref 0.0–0.1)
Basophils Relative: 1 %
Eosinophils Absolute: 0.9 10*3/uL — ABNORMAL HIGH (ref 0.0–0.5)
Eosinophils Relative: 11 %
HCT: 41.3 % (ref 39.0–52.0)
Hemoglobin: 13.8 g/dL (ref 13.0–17.0)
Immature Granulocytes: 1 %
Lymphocytes Relative: 29 %
Lymphs Abs: 2.2 10*3/uL (ref 0.7–4.0)
MCH: 31.8 pg (ref 26.0–34.0)
MCHC: 33.4 g/dL (ref 30.0–36.0)
MCV: 95.2 fL (ref 80.0–100.0)
Monocytes Absolute: 0.4 10*3/uL (ref 0.1–1.0)
Monocytes Relative: 5 %
Neutro Abs: 4.2 10*3/uL (ref 1.7–7.7)
Neutrophils Relative %: 53 %
Platelets: 161 10*3/uL (ref 150–400)
RBC: 4.34 MIL/uL (ref 4.22–5.81)
RDW: 15.4 % (ref 11.5–15.5)
WBC: 7.8 10*3/uL (ref 4.0–10.5)
nRBC: 0 % (ref 0.0–0.2)

## 2022-05-20 LAB — APTT: aPTT: 32 seconds (ref 24–36)

## 2022-05-20 LAB — COMPREHENSIVE METABOLIC PANEL
ALT: 12 U/L (ref 0–44)
AST: 24 U/L (ref 15–41)
Albumin: 3.1 g/dL — ABNORMAL LOW (ref 3.5–5.0)
Alkaline Phosphatase: 78 U/L (ref 38–126)
Anion gap: 6 (ref 5–15)
BUN: 26 mg/dL — ABNORMAL HIGH (ref 6–20)
CO2: 25 mmol/L (ref 22–32)
Calcium: 8.7 mg/dL — ABNORMAL LOW (ref 8.9–10.3)
Chloride: 111 mmol/L (ref 98–111)
Creatinine, Ser: 0.96 mg/dL (ref 0.61–1.24)
GFR, Estimated: >60 mL/min (ref >60)
Glucose, Bld: 105 mg/dL — ABNORMAL HIGH (ref 70–99)
Potassium: 3.7 mmol/L (ref 3.5–5.1)
Sodium: 142 mmol/L (ref 135–145)
Total Bilirubin: 0.6 mg/dL (ref 0.3–1.2)
Total Protein: 6.6 g/dL (ref 6.5–8.1)

## 2022-05-20 LAB — HEMOGLOBIN A1C
Hgb A1c MFr Bld: 5.1 % (ref 4.8–5.6)
Mean Plasma Glucose: 99.67 mg/dL

## 2022-05-20 LAB — C-REACTIVE PROTEIN: CRP: 2.3 mg/dL — ABNORMAL HIGH (ref <1.0)

## 2022-05-20 LAB — PROTIME-INR
INR: 1.1 (ref 0.8–1.2)
Prothrombin Time: 13.6 seconds (ref 11.4–15.2)

## 2022-05-20 LAB — SEDIMENTATION RATE: Sed Rate: 16 mm/hr (ref 0–16)

## 2022-05-20 LAB — GLUCOSE, CAPILLARY: Glucose-Capillary: 115 mg/dL — ABNORMAL HIGH (ref 70–99)

## 2022-05-20 LAB — LACTIC ACID, PLASMA
Lactic Acid, Venous: 1 mmol/L (ref 0.5–1.9)
Lactic Acid, Venous: 1.1 mmol/L (ref 0.5–1.9)

## 2022-05-20 LAB — PREALBUMIN: Prealbumin: 14 mg/dL — ABNORMAL LOW (ref 18–38)

## 2022-05-20 MED ORDER — LACTATED RINGERS IV SOLN: INTRAVENOUS | Status: DC

## 2022-05-20 MED ORDER — LORAZEPAM 2 MG/ML IJ SOLN: 1.0000 mg | INTRAMUSCULAR | Status: AC | PRN

## 2022-05-20 MED ORDER — ALPRAZOLAM 1 MG PO TABS: 1.0000 mg | ORAL_TABLET | Freq: Three times a day (TID) | ORAL | Status: DC | PRN

## 2022-05-20 MED ORDER — INSULIN ASPART 100 UNIT/ML IJ SOLN
0.0000 [IU] | Freq: Every day | INTRAMUSCULAR | Status: DC
  Administered 2022-05-21: 2 [IU] via SUBCUTANEOUS

## 2022-05-20 MED ORDER — SODIUM CHLORIDE 0.9 % IV SOLN
2.0000 g | INTRAVENOUS | Status: DC
  Administered 2022-05-20: 2 g via INTRAVENOUS
  Filled 2022-05-20: qty 20

## 2022-05-20 MED ORDER — VANCOMYCIN HCL 1250 MG/250ML IV SOLN
1250.0000 mg | Freq: Once | INTRAVENOUS | Status: AC
  Administered 2022-05-20: 1250 mg via INTRAVENOUS
  Filled 2022-05-20: qty 250

## 2022-05-20 MED ORDER — INSULIN ASPART 100 UNIT/ML IJ SOLN
0.0000 [IU] | Freq: Three times a day (TID) | INTRAMUSCULAR | Status: DC
  Administered 2022-05-21: 2 [IU] via SUBCUTANEOUS
  Administered 2022-05-21 – 2022-05-22 (×2): 3 [IU] via SUBCUTANEOUS
  Administered 2022-05-22: 2 [IU] via SUBCUTANEOUS
  Administered 2022-05-23: 5 [IU] via SUBCUTANEOUS
  Administered 2022-05-23: 1 [IU] via SUBCUTANEOUS
  Administered 2022-05-24: 5 [IU] via SUBCUTANEOUS

## 2022-05-20 MED ORDER — ADULT MULTIVITAMIN W/MINERALS CH
1.0000 | ORAL_TABLET | Freq: Every day | ORAL | Status: DC
  Administered 2022-05-20 – 2022-05-24 (×5): 1 via ORAL
  Filled 2022-05-20 (×5): qty 1

## 2022-05-20 MED ORDER — VANCOMYCIN HCL 1250 MG/250ML IV SOLN
1250.0000 mg | Freq: Two times a day (BID) | INTRAVENOUS | Status: DC
  Administered 2022-05-21 – 2022-05-23 (×5): 1250 mg via INTRAVENOUS
  Filled 2022-05-20 (×5): qty 250

## 2022-05-20 MED ORDER — METRONIDAZOLE 500 MG PO TABS
500.0000 mg | ORAL_TABLET | Freq: Two times a day (BID) | ORAL | Status: DC
  Administered 2022-05-20 – 2022-05-23 (×6): 500 mg via ORAL
  Filled 2022-05-20 (×6): qty 1

## 2022-05-20 MED ORDER — FOLIC ACID 1 MG PO TABS
1.0000 mg | ORAL_TABLET | Freq: Every day | ORAL | Status: DC
  Administered 2022-05-20 – 2022-05-24 (×5): 1 mg via ORAL
  Filled 2022-05-20 (×5): qty 1

## 2022-05-20 MED ORDER — GADOBUTROL 1 MMOL/ML IV SOLN
10.0000 mL | Freq: Once | INTRAVENOUS | Status: AC | PRN
  Administered 2022-05-20: 10 mL via INTRAVENOUS

## 2022-05-20 MED ORDER — ACETAMINOPHEN 325 MG PO TABS
650.0000 mg | ORAL_TABLET | Freq: Four times a day (QID) | ORAL | Status: DC | PRN
Start: 2022-05-20 — End: 2022-05-24

## 2022-05-20 MED ORDER — OXYCODONE HCL 5 MG PO TABS
30.0000 mg | ORAL_TABLET | Freq: Four times a day (QID) | ORAL | Status: DC | PRN
  Administered 2022-05-21 – 2022-05-24 (×4): 30 mg via ORAL
  Filled 2022-05-20 (×6): qty 6

## 2022-05-20 MED ORDER — THIAMINE HCL 100 MG PO TABS
100.0000 mg | ORAL_TABLET | Freq: Every day | ORAL | Status: DC
  Administered 2022-05-20 – 2022-05-24 (×5): 100 mg via ORAL
  Filled 2022-05-20 (×5): qty 1

## 2022-05-20 MED ORDER — VANCOMYCIN HCL 1250 MG/250ML IV SOLN
1250.0000 mg | Freq: Once | INTRAVENOUS | Status: AC
  Administered 2022-05-20: 1250 mg via INTRAVENOUS
  Filled 2022-05-20 (×2): qty 250

## 2022-05-20 MED ORDER — SODIUM CHLORIDE 0.9 % IV SOLN
2.0000 g | Freq: Three times a day (TID) | INTRAVENOUS | Status: DC
  Administered 2022-05-20 – 2022-05-23 (×9): 2 g via INTRAVENOUS
  Filled 2022-05-20 (×9): qty 12.5

## 2022-05-20 MED ORDER — ENOXAPARIN SODIUM 40 MG/0.4ML IJ SOSY
40.0000 mg | PREFILLED_SYRINGE | INTRAMUSCULAR | Status: DC
  Administered 2022-05-20 – 2022-05-23 (×4): 40 mg via SUBCUTANEOUS
  Filled 2022-05-20 (×4): qty 0.4

## 2022-05-20 MED ORDER — LORAZEPAM 1 MG PO TABS: 1.0000 mg | ORAL_TABLET | ORAL | Status: AC | PRN

## 2022-05-20 MED ORDER — MORPHINE SULFATE ER 30 MG PO TBCR
30.0000 mg | EXTENDED_RELEASE_TABLET | Freq: Two times a day (BID) | ORAL | Status: DC
  Administered 2022-05-20 – 2022-05-24 (×8): 30 mg via ORAL
  Filled 2022-05-20 (×8): qty 1

## 2022-05-20 NOTE — ED Triage Notes (Signed)
RT foot got hit by nail in the beginning of June.  Area has wound, redness, swelling and pain to RT lower leg and foot.

## 2022-05-20 NOTE — H&P (Signed)
TRH H&P   Patient Demographics:    Eugene Diaz, is a 56 y.o. male  MRN: [id number]   DOB - 1966/08/03  Admit Date - 05/20/2022  Outpatient Primary MD for the patient is Redmond School, MD  Referring MD/NP/PA: Dr Sabra Heck  Outpatient Specialists: Gilberto Better Dr Sharol Given, podiatry Dr [name]    Patient coming from: home  Chief Complaint  Patient presents with   Foot Injury      HPI:    Eugene Diaz  is a 56 y.o. 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 -Patient presents today secondary to left foot wound, he is status post right TMA by [name] May of this year, sister-in-law provides most of the history, he is noncompliant with his medications, decreased his alcohol consumption significantly, no smoking, noncompliant with medication,. -Patient reports 1 month ago he got his foot caught on a nail that was sticking out of his porch, does not recall pain given his severe neuropathy, reports intermittent redness, and swelling, but this has worsened recently, family had to convince the patient to come to ED given worsening flexion of his feet, he denies nausea, vomiting, fever, chills or pain. -In ED he was afebrile, no leukocytosis, lactic acid within normal limit, but his foot was noted to be significantly warm swollen and erythematous, with wound in the medial side of his foot, [facility] hospitalist consulted to admit    Review of systems:    A full 10 point Review of Systems was done, except as stated above, all other Review of Systems were negative.   With Past History of the following :    Past Medical History:  Diagnosis Date   Alcohol abuse    Alcoholic peripheral neuropathy ([place])    Anxiety    Arthritis     Asthma    followed by pcp   B12 deficiency    Depression    Diabetic neuropathy ([place])    Edema of both lower extremities    Essential tremor    neurologist-- dr patel--- due to alcohol abuse   GERD (gastroesophageal reflux disease)    History of cellulitis 2020   left lower leg;   recurrent 03-25-2020   History of esophageal stricture    s/p  dilatation 02-13-2018   History of osteomyelitis 2020   left great toe     Hypercholesterolemia    Hypertension    followed by pcp  (nuclear stress test 03-11-2014 low risk w/ no ischemia, ef 65%   Normocytic anemia    followed by pcp   (03-18-2020 had transfusion's 02/ 2021)   Osteomyelitis of great toe of right foot ([place]) 08/18/2021   Peripheral vascular disease ([place])    Severe depression ([place]) 06/09/2021   Status post incision and drainage  followed by Dr [name] and pcp   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   (03-17-2020  pt states most of incision is healed with exception an area that is still draining,  daily dressing change),  foot is red but not warm to the touch and has swelling but has improved)   Suicidal ideation 06/09/2021   Thrombocytopenia ([place])    chronic   Type 2 diabetes mellitus ([place])    followed by pcp---    (03-18-2020  pt stated does not check blood sugar)      Past Surgical History:  Procedure Laterality Date   AMPUTATION Left 10/16/2019   Procedure: Left partial second ray resection; placement of antibiotic beads;  Surgeon: Evelina Bucy, DPM;  [facility]: [facility];  Service: Podiatry;  Laterality: Left;   AMPUTATION Left 11/13/2019   Procedure: Left Midfoot Amputation - Transmetatarsal vs. Lisfranc; Placement antibiotic beads;  Surgeon: Evelina Bucy, DPM;  [facility];  Service: Podiatry;  Laterality: Left;   AMPUTATION Left 12/21/2019   Procedure: Chopart Amputation left foot;  Surgeon: Evelina Bucy, DPM;  Location: [place];  Service: Podiatry;  Laterality: Left;   AMPUTATION Left 06/24/2020    Procedure: LEFT BELOW KNEE AMPUTATION;  Surgeon: Newt Minion, MD;  Location: [place];  Service: Orthopedics;  Laterality: Left;   AMPUTATION Right 03/29/2021   Procedure: AMPUTATION RAY;  Surgeon: Evelina Bucy, DPM;  Location: [place];  Service: Podiatry;  Laterality: Right;   AMPUTATION TOE Left 08/14/2019   Procedure: AMPUTATION GREAT TOE;  Surgeon: Evelina Bucy, DPM;  [facility];  Service: Podiatry;  Laterality: Left;   APPLICATION OF WOUND VAC Left 12/21/2019   Procedure: Application Of Wound Vac;  Surgeon: Evelina Bucy, DPM;  Location: [place];  Service: Podiatry;  Laterality: Left;   BIOPSY  02/13/2018   Procedure: BIOPSY;  Surgeon: Danie Binder, MD;  [facility];  Service: Endoscopy;;  transverse colon biopsy, gastric biopsy   COLONOSCOPY WITH PROPOFOL N/A 02/13/2018   Procedure: COLONOSCOPY WITH PROPOFOL;  Surgeon: Danie Binder, MD;  [facility];  Service: Endoscopy;  Laterality: N/A;  11:15am   ESOPHAGOGASTRODUODENOSCOPY (EGD) WITH PROPOFOL N/A 02/13/2018   Procedure: ESOPHAGOGASTRODUODENOSCOPY (EGD) WITH PROPOFOL;  Surgeon: Danie Binder, MD;  [facility];  Service: Endoscopy;  Laterality: N/A;   I & D EXTREMITY Left 07/16/2019   Procedure: Insicion  AND DEBRIDEMENT EXTREMITY;  Surgeon: Evelina Bucy, DPM;  Location: [place];  Service: Podiatry;  Laterality: Left;   I & D EXTREMITY Left 06/22/2020   Procedure: IRRIGATION AND DEBRIDEMENT EXTREMITY, left foot and ankle;  Surgeon: Evelina Bucy, DPM;  Location: [place];  Service: Podiatry;  Laterality: Left;   I & D EXTREMITY Right 03/29/2021   Procedure: IRRIGATION AND DEBRIDEMENT EXTREMITY;  Surgeon: Evelina Bucy, DPM;  Location: [place];  Service: Podiatry;  Laterality: Right;   INCISION AND DRAINAGE OF WOUND Left 10/13/2019   Procedure: IRRIGATION AND DEBRIDEMENT WOUND OF LEFT FOOT AND FIRST METATARSAL RESECTION;  Surgeon: Evelina Bucy, DPM;  [facility];  Service:  Podiatry;  Laterality: Left;   IRRIGATION AND DEBRIDEMENT FOOT Left 11/11/2019   Procedure: Left Foot Wound Irrigation and Debridement;  Surgeon: Evelina Bucy, DPM;  [facility];  Service: Podiatry;  Laterality: Left;   Left arm     Left arm repair (tendon and artery)   PERCUTANEOUS PINNING  07/16/2019   Procedure: Open  Reduction Percutaneous Pinning Extremity;  Surgeon: Evelina Bucy, DPM;  Location: [place];  Service: Podiatry;;   PILONIDAL CYST EXCISION N/A 08/08/2014   Procedure: CYST EXCISION PILONIDAL EXTENSIVE;  Surgeon: Jamesetta So, MD;  [facility];  Service: General;  Laterality: N/A;   POLYPECTOMY  02/13/2018   Procedure: POLYPECTOMY;  Surgeon: Danie Binder, MD;  [facility];  Service: Endoscopy;;  transverse colon polyp hs, rectal polyps times 2   SAVORY DILATION N/A 02/13/2018   Procedure: SAVORY DILATION;  Surgeon: Danie Binder, MD;  [facility];  Service: Endoscopy;  Laterality: N/A;   TENDON LENGTHENING Right 12/04/2021   Procedure: TENDON LENGTHENING;  Surgeon: Landis Martins, DPM;  Location: [place];  Service: Podiatry;  Laterality: Right;   TRANSMETATARSAL AMPUTATION Right 12/04/2021   Procedure: TRANSMETATARSAL AMPUTATION;  Surgeon: Landis Martins, DPM;  Location: [place];  Service: Podiatry;  Laterality: Right;   WOUND DEBRIDEMENT Left 09/04/2019   Procedure: DEBRIDEMENT WOUND WITH COMPLEX  REPAIR OF DEHISCENCE;  Surgeon: Evelina Bucy, DPM;  Location: [place];  Service: Podiatry;  Laterality: Left;  Leave patient on stretcher   WOUND DEBRIDEMENT Left 10/16/2019   Procedure: Left foot wound debridement and closure;  Surgeon: Evelina Bucy, DPM;  [facility];  Service: Podiatry;  Laterality: Left;   WOUND DEBRIDEMENT Left 12/18/2019   Procedure: LEFT FOOT DEBRIDEMENT WITH PARTIAL INCISION OF INFECTED BONE;  Surgeon: Evelina Bucy, DPM;  Location: [place];  Service: Podiatry;  Laterality: Left;  LEFT FOOT  DEBRIDEMENT WITH PARTIAL INCISION OF INFECTED BONE   WOUND DEBRIDEMENT Right 04/01/2021   Procedure: Right foot wound debridement and irrigation and closure, bone biopsy;  Surgeon: Evelina Bucy, DPM;  Location: [place];  Service: Podiatry;  Laterality: Right;      Social History:     Social History   Tobacco Use   Smoking status: Former    Packs/day: 2.50    Years: 25.00    Total pack years: 62.50    Types: Cigarettes    Quit date: 08/05/1984    Years since quitting: 37.8   Smokeless tobacco: Never  Substance Use Topics   Alcohol use: Not Currently    Alcohol/week: 50.0 standard drinks of alcohol    Types: 50 Standard drinks or equivalent per week    Comment: Drinks 6 beers, liquor 4-6oz (both daily) x 35 years       Family History :     Family History  Problem Relation Age of Onset   Heart attack Mother        Living, 57   Cancer Father        Deceased   Healthy Brother    Healthy Sister    Colon cancer Neg Hx    Colon polyps Neg Hx       Home Medications:   Prior to Admission medications   Medication Sig Start Date End Date Taking? Authorizing Provider  acetaminophen (TYLENOL) 325 MG tablet Take 2 tablets (650 mg total) by mouth every 6 (six) hours as needed for mild pain. 03/03/22  Yes Hongalgi, Lenis Dickinson, MD  albuterol (VENTOLIN HFA) 108 (90 Base) MCG/ACT inhaler Inhale 1-2 puffs into the lungs every 4 (four) hours as needed for wheezing or shortness of breath. 05/11/22  Yes [provider]  ALPRAZolam Duanne Moron) 1 MG tablet Take 1 mg by mouth 4 (four) times daily. 05/14/22  Yes [provider]  amLODipine (NORVASC) 10 MG  tablet Take 10 mg by mouth daily. 05/11/22  Yes [provider]  diclofenac Sodium (VOLTAREN) 1 % GEL Apply 4 g topically 4 (four) times daily as needed (Apply to painful area over left upper front chest.). 03/03/22  Yes Hongalgi, Lenis Dickinson, MD  folic acid (FOLVITE) 1 MG tablet Take 1 tablet (1 mg total) by mouth daily. 03/03/22   Yes Hongalgi, Lenis Dickinson, MD  metFORMIN (GLUCOPHAGE) 500 MG tablet Take 500 mg by mouth 2 (two) times daily. 05/11/22  Yes [provider]  morphine (MS CONTIN) 30 MG 12 hr tablet Take 30 mg by mouth every 12 (twelve) hours. 05/14/22  Yes [provider]  Multiple Vitamin (MULTIVITAMIN WITH MINERALS) TABS tablet Take 1 tablet by mouth daily. 03/04/22  Yes Hongalgi, Lenis Dickinson, MD  oxycodone (ROXICODONE) 30 MG immediate release tablet Take 30 mg by mouth 4 (four) times daily as needed. 05/14/22  Yes [provider]  thiamine 100 MG tablet Take 1 tablet (100 mg total) by mouth daily. 03/04/22  Yes Hongalgi, Lenis Dickinson, MD  hydrochlorothiazide (HYDRODIURIL) 12.5 MG tablet Take 1 tablet (12.5 mg total) by mouth daily. Patient not taking: Reported on 05/20/2022 03/04/22   Modena Jansky, MD  irbesartan (AVAPRO) 150 MG tablet Take 1 tablet (150 mg total) by mouth daily. Patient not taking: Reported on 05/20/2022 03/04/22   Modena Jansky, MD  lisinopril (ZESTRIL) 40 MG tablet Take 40 mg by mouth daily. Patient not taking: Reported on 05/20/2022 05/11/22   [provider]     Allergies:    No Known Allergies   Physical Exam:   Vitals  Blood pressure 127/79, pulse 84, temperature 98 F (36.7 C), temperature source Oral, resp. rate 18, height '6\' 2"'$  (1.88 m), weight 120.2 kg, SpO2 98 %.   1. General developed male, laying in bed, no apparent distress  2. Normal affect and insight, Not Suicidal or Homicidal, Awake Alert, Oriented X 3.  3. No F.N deficits, ALL C.Nerves Intact, Strength 5/5 all 4 extremities, Sensation intact all 4 extremities, Plantars down going.  4. Ears and Eyes appear Normal, Conjunctivae clear, PERRLA. Moist Oral Mucosa.  5. Supple Neck, No JVD, No cervical lymphadenopathy appriciated, No Carotid Bruits.  6. Symmetrical Chest wall movement, Good air movement bilaterally, CTAB.  7. RRR, No Gallops, Rubs or Murmurs, No Parasternal Heave.  8. Positive  Bowel Sounds, Abdomen Soft, No tenderness, No organomegaly appriciated,No rebound -guarding or rigidity.  9.  No Cyanosis, right foot wound, with discharge, and right lower extremity erythema, please see picture below  10. Good muscle tone, left BKA, right TMA  11. No Palpable Lymph Nodes in Neck or Axillae       Data Review:    CBC Recent Labs  Lab 05/20/22 1556  WBC 7.8  HGB 13.8  HCT 41.3  PLT 161  MCV 95.2  MCH 31.8  MCHC 33.4  RDW 15.4  LYMPHSABS 2.2  MONOABS 0.4  EOSABS 0.9*  BASOSABS 0.1   ------------------------------------------------------------------------------------------------------------------  Chemistries  Recent Labs  Lab 05/20/22 1556  NA 142  K 3.7  CL 111  CO2 25  GLUCOSE 105*  BUN 26*  CREATININE 0.96  CALCIUM 8.7*  AST 24  ALT 12  ALKPHOS 78  BILITOT 0.6   ------------------------------------------------------------------------------------------------------------------ estimated creatinine clearance is 117 mL/min (by C-G formula based on SCr of 0.96 mg/dL). ------------------------------------------------------------------------------------------------------------------ No results for input(s): "TSH", "T4TOTAL", "T3FREE", "THYROIDAB" in the last 72 hours.  Invalid input(s): "FREET3"  Coagulation profile  Recent Labs  Lab 05/20/22 1556  INR 1.1   ------------------------------------------------------------------------------------------------------------------- No results for input(s): "DDIMER" in the last 72 hours. -------------------------------------------------------------------------------------------------------------------  Cardiac Enzymes No results for input(s): "CKMB", "TROPONINI", "MYOGLOBIN" in the last 168 hours.  Invalid input(s): "CK" ------------------------------------------------------------------------------------------------------------------    Component Value Date/Time   BNP 131.2 (H) 07/17/2019 0315      ---------------------------------------------------------------------------------------------------------------  Urinalysis    Component Value Date/Time   COLORURINE STRAW (A) 03/29/2021 1705   APPEARANCEUR CLEAR 03/29/2021 1705   LABSPEC 1.010 03/29/2021 1705   PHURINE 6.0 03/29/2021 1705   GLUCOSEU NEGATIVE 03/29/2021 1705   HGBUR MODERATE (A) 03/29/2021 1705   [place] 03/29/2021 1705   KETONESUR NEGATIVE 03/29/2021 1705   PROTEINUR NEGATIVE 03/29/2021 1705   UROBILINOGEN 4.0 (H) 10/09/2008 1114   NITRITE NEGATIVE 03/29/2021 1705   LEUKOCYTESUR NEGATIVE 03/29/2021 1705    ----------------------------------------------------------------------------------------------------------------   Imaging Results:    DG Tibia/Fibula Right  Result Date: 05/20/2022 CLINICAL DATA:  Right lower leg pain and swelling. EXAM: RIGHT TIBIA AND FIBULA - 2 VIEW COMPARISON:  None Available. FINDINGS: There is no evidence of fracture or other focal bone lesions. Soft tissues are unremarkable. IMPRESSION: Negative. Electronically Signed   By: Marijo Conception M.D.   On: 05/20/2022 16:27   DG Ankle Complete Right  Result Date: 05/20/2022 CLINICAL DATA:  Right foot pain and swelling. EXAM: RIGHT ANKLE - COMPLETE 3+ VIEW COMPARISON:  None Available. FINDINGS: There is no evidence of fracture, dislocation, or joint effusion. There is no evidence of arthropathy or other focal bone abnormality. Soft tissues are unremarkable. IMPRESSION: Negative. Electronically Signed   By: Marijo Conception M.D.   On: 05/20/2022 16:26   DG Foot Complete Right  Result Date: 05/20/2022 CLINICAL DATA:  Right foot wound. EXAM: RIGHT FOOT COMPLETE - 3+ VIEW COMPARISON:  December 04, 2021. FINDINGS: Status post surgical amputation at the level of the proximal metatarsals. No definite lytic destruction is seen to suggest osteomyelitis. Large soft tissue ulceration is seen anterior to the residual first metatarsal.  IMPRESSION: Large soft tissue ulceration is noted. No definite evidence of osteomyelitis is noted. Postsurgical changes as described above. Electronically Signed   By: Marijo Conception M.D.   On: 05/20/2022 16:25   DG Chest Port 1 View  Result Date: 05/20/2022 CLINICAL DATA:  Sepsis. EXAM: PORTABLE CHEST 1 VIEW COMPARISON:  Mar 01, 2022. FINDINGS: The heart size and mediastinal contours are within normal limits. Both lungs are clear. The visualized skeletal structures are unremarkable. IMPRESSION: No active disease. Electronically Signed   By: Marijo Conception M.D.   On: 05/20/2022 16:23    My personal review of EKG: Rhythm NSR, Rate  72 /min, QTc 440 ,   Assessment & Plan:    Principal Problem:   Wound infection Active Problems:   HTN (hypertension)   Hyperlipidemia associated with type 2 diabetes mellitus (HCC)   ETOH abuse   Alcoholic peripheral neuropathy ([facility])   Diabetic neuropathy ([facility])   GERD (gastroesophageal reflux disease)   Anemia, chronic disease   Type 2 diabetes mellitus ([place])  Infected diabetic ulcer of the right foot -Blood cultures were sent -X-ray with no evidence of osteomyelitis, will proceed with MRI of the right foot to rule out any osteomyelitis -Started on broad-spectrum antibiotic, given he had a nail injury we will proceed with cefepime to cover for Pseudomonas infection, giving recurrent hospitalization in the past we will keep on vancomycin as well, continue with Flagyl. -Depends on MRI  finding, he may need further work-up.  Type 2 diabetes mellitus with peripheral neuropathy (HCC) -He is on metformin, will hold and will keep an insulin sliding scale. -We will check A1c.   HTN (hypertension) -Compliant with his home medications, not taking any, his blood pressures currently acceptable, so we will start back on home meds when blood pressure started to increase  Hyperlipidemia associated with type 2 diabetes mellitus ([place]) On any statins prior to arrival    Tobacco abuse He was counseled   Alcohol use disorder Currently drinking half fifth of liquor over 1 week which is significant action from his previous . -Continue with thiamine and folic acid . -We will monitor closely for withdrawals, will keep on CIWA protocol  Chronic pain syndrome -On significant dose of narcotics, this was reviewed via PMP portal, and was resumed   Body mass index is 34.02 kg/m.    DVT Prophylaxis  Lovenox - SCDs  AM Labs Ordered, also please review Full Orders  Family Communication: Admission, patients condition and plan of care including tests being ordered have been discussed with the patient and sister in law who indicate understanding and agree with the plan and Code Status.  Code Status Full  Likely DC to  home  Condition GUARDED    Consults called: none    Admission status: inpatient    Time spent in minutes : 70 minutes   Phillips Climes M.D on 05/20/2022 at 5:49 PM   [facility] - Office  [phone]

## 2022-05-20 NOTE — Progress Notes (Signed)
PHARMACY ANTIBIOTIC CONSULT NOTE   Eugene Diaz a 56 y.o. male admitted on 7/21 with a wound infection .  Pharmacy has been consulted for vancomycin/cefepime dosing.   7/21: Scr 0.96, LA 1.0,  WBC 7.8  Vital Signs Stable   Estimated Creatinine Clearance: 117 mL/min (by C-G formula based on SCr of 0.96 mg/dL).  Plan: START Cefepime 2g IV Q8H GIVE Vancomycin 2,500 mg (ordered as 1,250 mg x 2) mg IV x1 (Wt used: 120.2 kg) THEN Vancomycin 1,250 mg IV Q12H (Scr used: 0.96, Vd used: 0.5, eAUC: 482) Monitor renal function, clinical status, de-escalation, C/S, levels as indicated   Allergies:  No Known Allergies  Filed Weights   05/20/22 1508  Weight: 120.2 kg (265 lb)       Latest Ref Rng & Units 05/20/2022    3:56 PM 03/02/2022   12:49 AM 12/06/2021    3:32 AM  CBC  WBC 4.0 - 10.5 K/uL 7.8  7.1  7.4   Hemoglobin 13.0 - 17.0 g/dL 13.8  16.3  9.8   Hematocrit 39.0 - 52.0 % 41.3  46.3  30.1   Platelets 150 - 400 K/uL 161  178  281     Antibiotics Given (last 72 hours)     Date/Time Action Medication Dose Rate   05/20/22 1618 New Bag/Given   cefTRIAXone (ROCEPHIN) 2 g in sodium chloride 0.9 % 100 mL IVPB 2 g 200 mL/hr       Antimicrobials this admission: Vancomycin 7/21>> Cefepime 7/21>>  Microbiology results: pending  Thank you for allowing pharmacy to be a part of this patient's care.  Adria Dill, PharmD PGY-2 Infectious Diseases Resident  05/20/2022 6:02 PM

## 2022-05-20 NOTE — Progress Notes (Signed)
Elink following sepsis bundle. °

## 2022-05-20 NOTE — ED Provider Notes (Signed)
[facility] EMERGENCY DEPARTMENT Provider Note   CSN: [id number] Arrival date & time: 05/20/22  1459     History {Add pertinent medical, surgical, social history, OB history to HPI:1} Chief Complaint  Patient presents with   Foot Injury    Eugene Diaz is a 56 y.o. male.   Foot Injury      This patient is a 56 year old male, he has a known history of hypertension on hydrochlorothiazide and irbesartan, the patient reports he is also a diabetic, his family doctor is Dr. 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  He reports about 1 month ago he got his foot caught on a nail that was sticking out of his porch, he does not recall a lot of pain as he has severe neuropathy of the feet and has essentially insensate feet.  Since that time he was doing okay with some intermittent redness and swelling which seems to come and go but over the last few days the redness has worsened the pain is worse and the swelling is worsened and they are now looking much worse than before.  His sister-in-law is the primary historian as the patient is not very forthcoming with information and had not wanted to come to the hospital today coming only because she made him.  He does smoke about 1 pack of cigars a day.  Home Medications Prior to Admission medications   Medication Sig Start Date End Date Taking? Authorizing Provider  acetaminophen (TYLENOL) 325 MG tablet Take 2 tablets (650 mg total) by mouth every 6 (six) hours as needed for mild pain. 03/03/22   Hongalgi, Lenis Dickinson, MD  diclofenac Sodium (VOLTAREN) 1 % GEL Apply 4 g topically 4 (four) times daily as needed (Apply to painful area over left upper front chest.). 03/03/22   Hongalgi, Lenis Dickinson, MD  folic acid (FOLVITE) 1 MG tablet Take 1 tablet (1 mg total) by mouth daily. 03/03/22    Hongalgi, Lenis Dickinson, MD  hydrochlorothiazide (HYDRODIURIL) 12.5 MG tablet Take 1 tablet (12.5 mg total) by mouth daily. 03/04/22   Hongalgi, Lenis Dickinson, MD  irbesartan (AVAPRO) 150 MG tablet Take 1 tablet (150 mg total) by mouth daily. 03/04/22   Hongalgi, Lenis Dickinson, MD  Multiple Vitamin (MULTIVITAMIN WITH MINERALS) TABS tablet Take 1 tablet by mouth daily. 03/04/22   Hongalgi, Lenis Dickinson, MD  thiamine 100 MG tablet Take 1 tablet (100 mg total) by mouth daily. 03/04/22   Hongalgi, Lenis Dickinson, MD      Allergies    Patient has no known allergies.    Review of Systems   Review of Systems  All other systems reviewed and are negative.   Physical Exam Updated Vital Signs BP 127/79 (BP Location: Right Arm)   Pulse 84   Temp 98 F (36.7 C) (Oral)   Resp 18   Ht 1.88 m ('6\' 2"'$ )   Wt 120.2 kg   SpO2 98%   BMI 34.02 kg/m  Physical Exam Vitals and nursing note reviewed.  Constitutional:      General: He is not in acute distress.    Appearance: He is well-developed.  HENT:     Head: Normocephalic and atraumatic.     Mouth/Throat:     Pharynx: No oropharyngeal exudate.  Eyes:     General: No scleral  icterus.       Right eye: No discharge.        Left eye: No discharge.     Conjunctiva/sclera: Conjunctivae normal.     Pupils: Pupils are equal, round, and reactive to light.  Neck:     Thyroid: No thyromegaly.     Vascular: No JVD.  Cardiovascular:     Rate and Rhythm: Normal rate and regular rhythm.     Heart sounds: Normal heart sounds. No murmur heard.    No friction rub. No gallop.     Comments: No tachycardia, no murmurs, normal pulses at the radial arteries bilaterally Pulmonary:     Effort: Pulmonary effort is normal. No respiratory distress.     Breath sounds: Normal breath sounds. No wheezing or rales.  Abdominal:     General: Bowel sounds are normal. There is no distension.     Palpations: Abdomen is soft. There is no mass.     Tenderness: There is no abdominal tenderness.   Musculoskeletal:        General: No tenderness. Normal range of motion.     Cervical back: Normal range of motion and neck supple.     Right lower leg: Edema present.     Comments: There is some tenderness and swelling of the right lower extremity, there is edema pitting all the way to the knee, warmth redness and heat.  The left lower extremity with an amputation looks at baseline without issues.  Lymphadenopathy:     Cervical: No cervical adenopathy.  Skin:    General: Skin is warm and dry.     Findings: Erythema and rash present.  Neurological:     Mental Status: He is alert.     Coordination: Coordination normal.     Comments: Generally sleepy in appearance  Psychiatric:        Behavior: Behavior normal.     ED Results / Procedures / Treatments   Labs (all labs ordered are listed, but only abnormal results are displayed) Labs Reviewed  RESP PANEL BY RT-PCR (FLU A&B, COVID) ARPGX2  CULTURE, BLOOD (ROUTINE X 2)  CULTURE, BLOOD (ROUTINE X 2)  URINE CULTURE  LACTIC ACID, PLASMA  LACTIC ACID, PLASMA  COMPREHENSIVE METABOLIC PANEL  CBC WITH DIFFERENTIAL/PLATELET  PROTIME-INR  APTT  URINALYSIS, ROUTINE W REFLEX MICROSCOPIC    EKG None  Radiology No results found.  Procedures Procedures  {Document cardiac monitor, telemetry assessment procedure when appropriate:1}  Medications Ordered in ED Medications  lactated ringers infusion (has no administration in time range)  cefTRIAXone (ROCEPHIN) 2 g in sodium chloride 0.9 % 100 mL IVPB (has no administration in time range)    ED Course/ Medical Decision Making/ A&P                           Medical Decision Making Amount and/or Complexity of Data Reviewed Labs: ordered. Radiology: ordered. ECG/medicine tests: ordered.  Risk Prescription drug management.   This patient presents to the ED for concern of increasing redness pain and swelling of his leg, this involves an extensive number of treatment options, and  is a complaint that carries with it a high risk of complications and morbidity.  The differential diagnosis includes sepsis, significant cellulitis, osteomyelitis, doubt septic joint given minimal pain with range of motion but given his neuropathy it may be difficult to tell   Co morbidities that complicate the patient evaluation  Diabetes hypertension ongoing smoking   Additional  history obtained:  Additional history obtained from electronic medical record as well as the sister-in-law External records from outside source obtained and reviewed including prior podiatry office notes from Dr. [name] last seen in February 2023   Lab Tests:  I Ordered, and personally interpreted labs.  The pertinent results include:  ***   Imaging Studies ordered:  I ordered imaging studies including ***  I independently visualized and interpreted imaging which showed *** I agree with the radiologist interpretation   Cardiac Monitoring: / EKG:  The patient was maintained on a cardiac monitor.  I personally viewed and interpreted the cardiac monitored which showed an underlying rhythm of: ***   Consultations Obtained:  I requested consultation with the ***,  and discussed lab and imaging findings as well as pertinent plan - they recommend: ***   Problem List / ED Course / Critical interventions / Medication management  *** I ordered medication including ***  for ***  Reevaluation of the patient after these medicines showed that the patient {resolved/improved/worsened:23923::"improved"} I have reviewed the patients home medicines and have made adjustments as needed   Social Determinants of Health:  ***   Test / Admission - Considered:  ***   {Document critical care time when appropriate:1} {Document review of labs and clinical decision tools ie heart score, Chads2Vasc2 etc:1}  {Document your independent review of radiology images, and any outside records:1} {Document your discussion  with family members, caretakers, and with consultants:1} {Document social determinants of health affecting pt's care:1} {Document your decision making why or why not admission, treatments were needed:1} Final Clinical Impression(s) / ED Diagnoses Final diagnoses:  None    Rx / DC Orders ED Discharge Orders     None

## 2022-05-21 ENCOUNTER — Other Ambulatory Visit: Payer: Self-pay

## 2022-05-21 DIAGNOSIS — D638 Anemia in other chronic diseases classified elsewhere: Secondary | ICD-10-CM

## 2022-05-21 DIAGNOSIS — I10 Essential (primary) hypertension: Secondary | ICD-10-CM

## 2022-05-21 DIAGNOSIS — T148XXA Other injury of unspecified body region, initial encounter: Secondary | ICD-10-CM | POA: Diagnosis not present

## 2022-05-21 DIAGNOSIS — E1141 Type 2 diabetes mellitus with diabetic mononeuropathy: Secondary | ICD-10-CM | POA: Diagnosis not present

## 2022-05-21 DIAGNOSIS — E785 Hyperlipidemia, unspecified: Secondary | ICD-10-CM

## 2022-05-21 DIAGNOSIS — K219 Gastro-esophageal reflux disease without esophagitis: Secondary | ICD-10-CM | POA: Diagnosis not present

## 2022-05-21 LAB — BASIC METABOLIC PANEL
Anion gap: 5 (ref 5–15)
BUN: 16 mg/dL (ref 6–20)
CO2: 23 mmol/L (ref 22–32)
Calcium: 8.3 mg/dL — ABNORMAL LOW (ref 8.9–10.3)
Chloride: 113 mmol/L — ABNORMAL HIGH (ref 98–111)
Creatinine, Ser: 0.63 mg/dL (ref 0.61–1.24)
GFR, Estimated: >60 mL/min (ref >60)
Glucose, Bld: 99 mg/dL (ref 70–99)
Potassium: 3.9 mmol/L (ref 3.5–5.1)
Sodium: 141 mmol/L (ref 135–145)

## 2022-05-21 LAB — GLUCOSE, CAPILLARY
Glucose-Capillary: 107 mg/dL — ABNORMAL HIGH (ref 70–99)
Glucose-Capillary: 140 mg/dL — ABNORMAL HIGH (ref 70–99)
Glucose-Capillary: 164 mg/dL — ABNORMAL HIGH (ref 70–99)
Glucose-Capillary: 204 mg/dL — ABNORMAL HIGH (ref 70–99)

## 2022-05-21 LAB — CBC
HCT: 41.5 % (ref 39.0–52.0)
Hemoglobin: 13.8 g/dL (ref 13.0–17.0)
MCH: 31.9 pg (ref 26.0–34.0)
MCHC: 33.3 g/dL (ref 30.0–36.0)
MCV: 96.1 fL (ref 80.0–100.0)
Platelets: 141 10*3/uL — ABNORMAL LOW (ref 150–400)
RBC: 4.32 MIL/uL (ref 4.22–5.81)
RDW: 15.4 % (ref 11.5–15.5)
WBC: 5.9 10*3/uL (ref 4.0–10.5)
nRBC: 0 % (ref 0.0–0.2)

## 2022-05-21 LAB — HIV ANTIBODY (ROUTINE TESTING W REFLEX): HIV Screen 4th Generation wRfx: NONREACTIVE

## 2022-05-21 MED ORDER — ENSURE ENLIVE PO LIQD: 237.0000 mL | Freq: Two times a day (BID) | ORAL | Status: DC

## 2022-05-21 MED ORDER — JUVEN PO PACK
1.0000 | PACK | Freq: Two times a day (BID) | ORAL | Status: DC
  Administered 2022-05-22 – 2022-05-24 (×5): 1 via ORAL
  Filled 2022-05-21 (×5): qty 1

## 2022-05-21 MED ORDER — CHLORDIAZEPOXIDE HCL 5 MG PO CAPS: 10.0000 mg | ORAL_CAPSULE | Freq: Three times a day (TID) | ORAL | Status: DC

## 2022-05-21 MED ORDER — ENSURE ENLIVE PO LIQD
237.0000 mL | ORAL | Status: DC
  Administered 2022-05-22 – 2022-05-23 (×2): 237 mL via ORAL

## 2022-05-21 MED ORDER — PROSOURCE PLUS PO LIQD
30.0000 mL | Freq: Two times a day (BID) | ORAL | Status: DC
  Administered 2022-05-22 – 2022-05-24 (×5): 30 mL via ORAL
  Filled 2022-05-21 (×5): qty 30

## 2022-05-21 MED ORDER — CHLORDIAZEPOXIDE HCL 5 MG PO CAPS
5.0000 mg | ORAL_CAPSULE | Freq: Three times a day (TID) | ORAL | Status: DC
  Administered 2022-05-21 – 2022-05-24 (×9): 5 mg via ORAL
  Filled 2022-05-21 (×9): qty 1

## 2022-05-21 MED ORDER — LACTATED RINGERS IV SOLN: INTRAVENOUS | Status: DC

## 2022-05-21 NOTE — Progress Notes (Signed)
PROGRESS NOTE   Eugene Diaz  NUU:[id number] DOB: [birth year] DOA: 05/20/2022 PCP: Redmond School, MD   Chief Complaint  Patient presents with   Foot Injury   Level of care: Telemetry  Brief Admission History:  57 y.o. 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 -Patient presents today secondary to left foot wound, he is status post right TMA by [name] May of this year, sister-in-law provides most of the history, he is noncompliant with his medications, decreased his alcohol consumption significantly, no smoking, noncompliant with medication,. -Patient reports 1 month ago he got his foot caught on a nail that was sticking out of his porch, does not recall pain given his severe neuropathy, reports intermittent redness, and swelling, but this has worsened recently, family had to convince the patient to come to ED given worsening flexion of his feet, he denies nausea, vomiting, fever, chills or pain. -In ED he was afebrile, no leukocytosis, lactic acid within normal limit, but his foot was noted to be significantly warm swollen and erythematous, with wound in the medial side of his foot, [facility] hospitalist consulted to admit     Assessment and Plan: Osteomyelitis Right stump /Cellulitis Right stump/ Infected diabetic ulcer of the right foot -Blood cultures were sent - no growth to date -X-ray with no evidence of osteomyelitis, MRI is suggesting osteomyelitis -Started on broad-spectrum antibiotics, given he had a nail injury we will proceed with cefepime to cover for Pseudomonas infection, giving recurrent hospitalization in the past we will keep on vancomycin as well, continue with Flagyl.   Type 2 diabetes mellitus with peripheral neuropathy (HCC) -He is on metformin, will hold and will keep an insulin sliding  scale. -We will check A1c.  CBG (last 3)  Recent Labs    05/21/22 0757 05/21/22 1154 05/21/22 1605  GLUCAP 107* 140* 164*    HTN (hypertension) -Compliant with his home medications, not taking any, his blood pressures currently acceptable, so we will start back on home meds when blood pressure started to increase   Hyperlipidemia associated with type 2 diabetes mellitus ([place]) Not on statins prior to arrival   Tobacco abuse He was counseled   Alcohol use disorder Currently drinking half fifth of liquor over 1 week which is significant action from his previous . -Continue with thiamine and folic acid . -We will monitor closely for withdrawals, will keep on CIWA protocol   Chronic pain syndrome -On significant dose of narcotics, this was reviewed via PMP portal, and was resumed   Body mass index is 34.02 kg/m.     DVT Prophylaxis  Lovenox - SCDs  Code Status: Full  Family Communication:  Disposition: Status is: Inpatient Remains inpatient appropriate because: IV antibiotics required    Consultants:   Procedures:   Antimicrobials:    Subjective: Pt reports that he is unsure how to approach treatment of osteomyelitis, he had done just about everything in the past, longterm IV abx, surgery, etc.  Objective: Vitals:   05/21/22 0536 05/21/22 0809 05/21/22 0842 05/21/22 1607  BP: 138/77 137/83 (!) 154/84 124/72  Pulse: 76  80 66  Resp: '18 19 19 20  '$ Temp: 98 F (36.7 C) 98.8 F (37.1 C)  98.4 F (36.9 C)  TempSrc:  Oral  Oral  SpO2: 95% 95% 95% 97%  Weight:      Height:  Intake/Output Summary (Last 24 hours) at 05/21/2022 1654 Last data filed at 05/21/2022 1010 Gross per 24 hour  Intake 2995 ml  Output 2650 ml  Net 345 ml   Filed Weights   05/20/22 1508 05/20/22 2020  Weight: 120.2 kg 119.3 kg   Examination:  General exam: Appears calm and comfortable  Respiratory system: Clear to auscultation. Respiratory effort normal. Cardiovascular system:  normal S1 & S2 heard. No JVD, murmurs, rubs, gallops or clicks. No pedal edema. Gastrointestinal system: Abdomen is nondistended, soft and nontender. No organomegaly or masses felt. Normal bowel sounds heard. Central nervous system: Alert and oriented. No focal neurological deficits. Extremities: cellulitis right foot stump. Skin: No rashes, lesions or ulcers. Psychiatry: Judgement and insight appear normal. Mood & affect appropriate.   Data Reviewed: I have personally reviewed following labs and imaging studies  CBC: Recent Labs  Lab 05/20/22 1556 05/21/22 0537  WBC 7.8 5.9  NEUTROABS 4.2  --   HGB 13.8 13.8  HCT 41.3 41.5  MCV 95.2 96.1  PLT 161 141*    Basic Metabolic Panel: Recent Labs  Lab 05/20/22 1556 05/21/22 0537  NA 142 141  K 3.7 3.9  CL 111 113*  CO2 25 23  GLUCOSE 105* 99  BUN 26* 16  CREATININE 0.96 0.63  CALCIUM 8.7* 8.3*    CBG: Recent Labs  Lab 05/20/22 2115 05/21/22 0757 05/21/22 1154 05/21/22 1605  GLUCAP 115* 107* 140* 164*    Recent Results (from the past 240 hour(s))  Blood Culture (routine x 2)     Status: None (Preliminary result)   Collection Time: 05/20/22  3:56 PM   Specimen: Right Antecubital; Blood  Result Value Ref Range Status   Specimen Description   Final    RIGHT ANTECUBITAL BOTTLES DRAWN AEROBIC AND ANAEROBIC   Special Requests   Final    Blood Culture results may not be optimal due to an excessive volume of blood received in culture bottles   Culture   Final    NO GROWTH < 12 HOURS Performed at [facility], [address] [place], [place] [address]    Report Status PENDING  Incomplete  Blood Culture (routine x 2)     Status: None (Preliminary result)   Collection Time: 05/20/22  3:56 PM   Specimen: BLOOD LEFT FOREARM  Result Value Ref Range Status   Specimen Description   Final    BLOOD LEFT FOREARM BOTTLES DRAWN AEROBIC AND ANAEROBIC   Special Requests Blood Culture adequate volume  Final   Culture   Final     NO GROWTH < 12 HOURS Performed at [facility], [address] [place], [place] [address]    Report Status PENDING  Incomplete     Radiology Studies: MR FOOT RIGHT W WO CONTRAST  Result Date: 05/20/2022 CLINICAL DATA:  Diabetic.  Foot swelling.  Osteomyelitis suspected. EXAM: MRI OF THE RIGHT FOREFOOT WITHOUT AND WITH CONTRAST TECHNIQUE: Multiplanar, multisequence MR imaging of the right forefoot was performed before and after the administration of intravenous contrast. CONTRAST:  65m GADAVIST GADOBUTROL 1 MMOL/ML IV SOLN COMPARISON:  right ankle and foot; right foot radiographs 12/04/2021; MRI right forefoot radiographs 05/20/2022 FINDINGS: Bones/Joint/Cartilage Postsurgical changes are again seen of amputation of the first through fifth rays at the level of the proximal to mid metatarsal shafts. Given the recent surgery 12/2021 there is expected lack of cortex at the distal aspect of the each second through fifth metatarsal amputation site limiting evaluation for new cortical erosion  and osteomyelitis. Nevertheless, there is marrow edema within multiple metatarsals concerning for acute osteomyelitis. This marrow edema is greatest within the third and fourth metatarsals wear involves the majority of the metatarsal shafts (coronal series 10 images 20 through 23). There is also moderate to high-grade marrow edema throughout the majority of the remaining fifth metatarsal shaft (coronal series 10 images 22 and 23 and axial series 7, images 18 through 23). There is also trace marrow edema at the distal aspect of the more chronic first metatarsal amputation stump (coronal series 10 image 19). Given the edema and swelling of the soft tissues around the first through fifth distal amputation stumps, this marrow edema remains concerning for osteomyelitis. Ligaments The Lisfranc ligament complex appears intact. Muscles and Tendons There is mild-to-moderate diffuse edema within the extensor digitorum brevis and the  abductor hallucis longus and flexor digitorum brevis muscles, nonspecific myositis. Soft tissues There is moderate medial and lateral ankle and dorsal foot subcutaneous fat soft tissue swelling and edema. There is more mild edema and swelling just distal to the first through fifth metatarsal amputation stumps. There is ulceration within the soft tissues at the plantar medial aspect of the postsurgical forefoot, distal and medial to the first metatarsal amputation site (coronal series 10, image 25; axial series 7, image 8, and sagittal series 8, image 25). IMPRESSION: 1. There is a new soft tissue ulcer within the medial forefoot just distal plantar coronal and medial to the first metatarsal amputation stump. There is edema and swelling of the ankle and dorsal midfoot soft tissues extending anterior to the first through fifth amputation stumps. 2. As described above, there is marrow edema within the third and fourth greater than fifth remaining metatarsal shafts and very mild marrow edema within the distal aspect of the remaining first and second metatarsal shafts. These findings remain concerning for osteomyelitis involving these regions of the metatarsals, again greatest within the third through fifth metatarsal shafts. Electronically Signed   By: Yvonne Kendall M.D.   On: 05/20/2022 19:31   DG Tibia/Fibula Right  Result Date: 05/20/2022 CLINICAL DATA:  Right lower leg pain and swelling. EXAM: RIGHT TIBIA AND FIBULA - 2 VIEW COMPARISON:  None Available. FINDINGS: There is no evidence of fracture or other focal bone lesions. Soft tissues are unremarkable. IMPRESSION: Negative. Electronically Signed   By: Marijo Conception M.D.   On: 05/20/2022 16:27   DG Ankle Complete Right  Result Date: 05/20/2022 CLINICAL DATA:  Right foot pain and swelling. EXAM: RIGHT ANKLE - COMPLETE 3+ VIEW COMPARISON:  None Available. FINDINGS: There is no evidence of fracture, dislocation, or joint effusion. There is no evidence of  arthropathy or other focal bone abnormality. Soft tissues are unremarkable. IMPRESSION: Negative. Electronically Signed   By: Marijo Conception M.D.   On: 05/20/2022 16:26   DG Foot Complete Right  Result Date: 05/20/2022 CLINICAL DATA:  Right foot wound. EXAM: RIGHT FOOT COMPLETE - 3+ VIEW COMPARISON:  December 04, 2021. FINDINGS: Status post surgical amputation at the level of the proximal metatarsals. No definite lytic destruction is seen to suggest osteomyelitis. Large soft tissue ulceration is seen anterior to the residual first metatarsal. IMPRESSION: Large soft tissue ulceration is noted. No definite evidence of osteomyelitis is noted. Postsurgical changes as described above. Electronically Signed   By: Marijo Conception M.D.   On: 05/20/2022 16:25   DG Chest Port 1 View  Result Date: 05/20/2022 CLINICAL DATA:  Sepsis. EXAM: PORTABLE CHEST 1 VIEW COMPARISON:  Mar 01, 2022. FINDINGS: The heart size and mediastinal contours are within normal limits. Both lungs are clear. The visualized skeletal structures are unremarkable. IMPRESSION: No active disease. Electronically Signed   By: Marijo Conception M.D.   On: 05/20/2022 16:23    Scheduled Meds:  [START ON 05/22/2022] (feeding supplement) PROSource Plus  30 mL Oral BID BM   enoxaparin (LOVENOX) injection  40 mg Subcutaneous Q24H   [START ON 05/22/2022] feeding supplement  237 mL Oral Q68T   folic acid  1 mg Oral Daily   insulin aspart  0-15 Units Subcutaneous TID WC   insulin aspart  0-5 Units Subcutaneous QHS   metroNIDAZOLE  500 mg Oral Q12H   morphine  30 mg Oral Q12H   multivitamin with minerals  1 tablet Oral Daily   [START ON 05/22/2022] nutrition supplement (JUVEN)  1 packet Oral BID BM   thiamine  100 mg Oral Daily   Continuous Infusions:  ceFEPime (MAXIPIME) IV 2 g (05/21/22 1232)   lactated ringers 65 mL/hr at 05/21/22 0824   vancomycin 1,250 mg (05/21/22 0855)    LOS: 1 day   Time spent: 36 mins  Edyn Qazi Wynetta Emery, MD How to  contact the [facility] Attending or Consulting provider [place] or covering provider during after hours [place], for this patient?  Check the care team in [facility] and look for a) attending/consulting [facility] provider listed and b) the [facility] team listed Log into [email or web address] and use [place]'s universal password to access. If you do not have the password, please contact the hospital operator. Locate the [facility] provider you are looking for under [facility] and page to a number that you can be directly reached. If you still have difficulty reaching the provider, please page the [facility] (Director on Call) for the Hospitalists listed on amion for assistance.  05/21/2022, 4:54 PM

## 2022-05-21 NOTE — Progress Notes (Signed)
R foot wound care performed. Pt is s/p TMA (RIGHT) surgery in MAY 2023. Wound to medial aspect of RT foot Redness  and edema extends up to  lower R leg. This RN applied xeroform, gauze, and bulky dressing. Foot elevated. Eugene Corona Edd Fabian

## 2022-05-21 NOTE — Progress Notes (Signed)
Initial Nutrition Assessment  DOCUMENTATION CODES:   Not applicable  INTERVENTION:  Liberalize diet from a heart healthy/carb modified to a regular diet to provide widest variety of menu options to enhance nutritional adequacy   Ensure Enlive po once daily, each supplement provides 350 kcal and 20 grams of protein.  1 packet Juven BID, each packet provides 95 calories, 2.5 grams of protein (collagen), and 9.8 grams of carbohydrate (3 grams sugar); + vitamins and minerals to support wound healing  30 ml ProSource Plus BID, each supplement provides 100 kcals and 15 grams protein.   MVI with minerals daily  NUTRITION DIAGNOSIS:   Increased nutrient needs related to wound healing as evidenced by estimated needs  GOAL:   Patient will meet greater than or equal to 90% of their needs  MONITOR:   PO intake, Supplement acceptance, Diet advancement, Labs, Weight trends  REASON FOR ASSESSMENT:   Consult Wound healing  ASSESSMENT:   Pt admitted with R foot wound. 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  Spoke with pt via phone call to room. He reports having a good appetite and eating 100% of his meals during admission. At home he reports having a good appetite and denies changes to his PO intake, although he endorses wt loss within the last year which he attributes to living alone and having lost his wife of 30 years about 4 years ago.   Pt reports a wt loss of about 60 lbs within the last year.  Reviewed wt history. It appears he has had a 9.3% wt loss since July 2022, which is not clinically significant for time frame.   Reviewed labs and pt's blood sugar and A1c appear to be well controlled at this time. He would benefit from a liberalized diet as well as nutrition supplements to encourage optimal nutritional intake to support wound healing. Pt is agreeable to supplements  suggested.   Medications: folvite, SSI 0-15 units qhs (not given), MVI, thiamine IV drips: abx, LR '@65ml'$ /hr   Labs: HgbA1c 5.1% (7/21), CBG's 107-140 x24 hours  NUTRITION - FOCUSED PHYSICAL EXAM: RD working remotely. Deferred to follow up.   Diet Order:   Diet Order             Diet regular Room service appropriate? Yes; Fluid consistency: Thin  Diet effective now                   EDUCATION NEEDS:   Education needs have been addressed  Skin:  Skin Assessment: Skin Integrity Issues: Skin Integrity Issues:: Other (Comment) Other: non-pressure wound R foot  Last BM:  PTA  Height:   Ht Readings from Last 1 Encounters:  05/20/22 '6\' 2"'$  (1.88 m)    Weight:   Wt Readings from Last 1 Encounters:  05/20/22 119.3 kg    Ideal Body Weight:  69.2 kg  BMI:  Body mass index is 33.77 kg/m.  Estimated Nutritional Needs:   Kcal:  2000-2200  Protein:  100-115g  Fluid:  >/=2L  Clayborne Dana, RDN, LDN Clinical Nutrition

## 2022-05-21 NOTE — Progress Notes (Signed)
PT complains of right hand weakness. Grip strength is moderate. Pt reports that it has been on going for a few days and forgot to mention it in the ED. MD notified at bedside.

## 2022-05-21 NOTE — Inpatient Diabetes Management (Signed)
Inpatient Diabetes Program Recommendations  AACE/ADA: New Consensus Statement on Inpatient Glycemic Control (2015)  Target Ranges:  Prepandial:   less than 140 mg/dL      Peak postprandial:   less than 180 mg/dL (1-2 hours)      Critically ill patients:  140 - 180 mg/dL   Lab Results  Component Value Date   GLUCAP 107 (H) 05/21/2022   HGBA1C 5.1 05/20/2022    Review of Glycemic Control  Diabetes history: DM2 Outpatient Diabetes medications: metformin 500 mg BID Current orders for Inpatient glycemic control: Novolog 0-15 units TID with meals and 0-5 HS  HgbA1C - 5.1%  Inpatient Diabetes Program Recommendations:    Agree with orders.  Will continue to follow glucose trends.   Thank you. Lorenda Peck, RD, LDN, CDE Inpatient Diabetes Coordinator [phone]

## 2022-05-21 NOTE — Hospital Course (Signed)
56 y.o. 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 -Patient presents today secondary to left foot wound, he is status post right TMA by [name] May of this year, sister-in-law provides most of the history, he is noncompliant with his medications, decreased his alcohol consumption significantly, no smoking, noncompliant with medication,. -Patient reports 1 month ago he got his foot caught on a nail that was sticking out of his porch, does not recall pain given his severe neuropathy, reports intermittent redness, and swelling, but this has worsened recently, family had to convince the patient to come to ED given worsening flexion of his feet, he denies nausea, vomiting, fever, chills or pain. -In ED he was afebrile, no leukocytosis, lactic acid within normal limit, but his foot was noted to be significantly warm swollen and erythematous, with wound in the medial side of his foot, [facility] hospitalist consulted to admit

## 2022-05-22 ENCOUNTER — Inpatient Hospital Stay (HOSPITAL_COMMUNITY): Payer: Medicare Other

## 2022-05-22 DIAGNOSIS — M869 Osteomyelitis, unspecified: Secondary | ICD-10-CM

## 2022-05-22 DIAGNOSIS — D638 Anemia in other chronic diseases classified elsewhere: Secondary | ICD-10-CM | POA: Diagnosis not present

## 2022-05-22 DIAGNOSIS — E1141 Type 2 diabetes mellitus with diabetic mononeuropathy: Secondary | ICD-10-CM | POA: Diagnosis not present

## 2022-05-22 DIAGNOSIS — K219 Gastro-esophageal reflux disease without esophagitis: Secondary | ICD-10-CM | POA: Diagnosis not present

## 2022-05-22 DIAGNOSIS — T148XXA Other injury of unspecified body region, initial encounter: Secondary | ICD-10-CM | POA: Diagnosis not present

## 2022-05-22 DIAGNOSIS — R29898 Other symptoms and signs involving the musculoskeletal system: Secondary | ICD-10-CM | POA: Diagnosis present

## 2022-05-22 LAB — BASIC METABOLIC PANEL
Anion gap: 4 — ABNORMAL LOW (ref 5–15)
BUN: 8 mg/dL (ref 6–20)
CO2: 25 mmol/L (ref 22–32)
Calcium: 8.8 mg/dL — ABNORMAL LOW (ref 8.9–10.3)
Chloride: 108 mmol/L (ref 98–111)
Creatinine, Ser: 0.62 mg/dL (ref 0.61–1.24)
GFR, Estimated: >60 mL/min (ref >60)
Glucose, Bld: 105 mg/dL — ABNORMAL HIGH (ref 70–99)
Potassium: 3.6 mmol/L (ref 3.5–5.1)
Sodium: 137 mmol/L (ref 135–145)

## 2022-05-22 LAB — GLUCOSE, CAPILLARY
Glucose-Capillary: 109 mg/dL — ABNORMAL HIGH (ref 70–99)
Glucose-Capillary: 162 mg/dL — ABNORMAL HIGH (ref 70–99)
Glucose-Capillary: 138 mg/dL — ABNORMAL HIGH (ref 70–99)
Glucose-Capillary: 159 mg/dL — ABNORMAL HIGH (ref 70–99)

## 2022-05-22 LAB — CBC
HCT: 39.8 % (ref 39.0–52.0)
Hemoglobin: 13.2 g/dL (ref 13.0–17.0)
MCH: 31.7 pg (ref 26.0–34.0)
MCHC: 33.2 g/dL (ref 30.0–36.0)
MCV: 95.7 fL (ref 80.0–100.0)
Platelets: 144 10*3/uL — ABNORMAL LOW (ref 150–400)
RBC: 4.16 MIL/uL — ABNORMAL LOW (ref 4.22–5.81)
RDW: 15.2 % (ref 11.5–15.5)
WBC: 5.3 10*3/uL (ref 4.0–10.5)
nRBC: 0 % (ref 0.0–0.2)

## 2022-05-22 LAB — MAGNESIUM: Magnesium: 1.6 mg/dL — ABNORMAL LOW (ref 1.7–2.4)

## 2022-05-22 MED ORDER — NICOTINE 21 MG/24HR TD PT24
21.0000 mg | MEDICATED_PATCH | Freq: Every day | TRANSDERMAL | Status: DC
  Administered 2022-05-22: 21 mg via TRANSDERMAL
  Filled 2022-05-22 (×2): qty 1

## 2022-05-22 NOTE — Evaluation (Signed)
Physical Therapy Evaluation Patient Details Name: Eugene Diaz MRN: [id number] DOB: [birth year] Today's Date: 05/22/2022  History of Present Illness  Eugene Diaz  is a 56 y.o. 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  -Patient presents today secondary to left foot wound, he is status post right TMA by [name] May of this year, sister-in-law provides most of the history, he is noncompliant with his medications, decreased his alcohol consumption significantly, no smoking, noncompliant with medication,.  -Patient reports 1 month ago he got his foot caught on a nail that was sticking out of his porch, does not recall pain given his severe neuropathy, reports intermittent redness, and swelling, but this has worsened recently, family had to convince the patient to come to ED given worsening flexion of his feet, he denies nausea, vomiting, fever, chills or pain.  -In ED he was afebrile, no leukocytosis, lactic acid within normal limit, but his foot was noted to be significantly warm swollen and erythematous, with wound in the medial side of his foot, [facility] hospitalist consulted to admit    Clinical Impression  Patient sitting up in bed on therapist arrival. He states he is upset his necklace with  his wife's ashes has been lost since yesterday but otherwise pleasant and agreeable to therapy.  Supine to sit with SBA; takes extra time and uses upper extremities to assist with pulling up to sit on the edge of bed.  Patient performs squat pivot transfer using heel of Right foot to weight bear and pivot to chair; uses upper extremities on bed and arm of the chair for balance and to assist with turning to sit in the chair.  Therapist assisted patient with elevating his legs; left him in chair at end of session with nurse call button in reach.   Notified nursing of mobility status.  Patient will benefit from continued skilled therapy services during the duration of this hospital stay and the next recommended venue of care to address deficits and promote return to optimal function.        Recommendations for follow up therapy are one component of a multi-disciplinary discharge planning process, led by the attending physician.  Recommendations may be updated based on patient status, additional functional criteria and insurance authorization.  Follow Up Recommendations Home health PT      Assistance Recommended at Discharge Intermittent Supervision/Assistance  Patient can return home with the following  A little help with walking and/or transfers;A little help with bathing/dressing/bathroom;Help with stairs or ramp for entrance    Equipment Recommendations None recommended by PT  Recommendations for Other Services       Functional Status Assessment Patient has had a recent decline in their functional status and demonstrates the ability to make significant improvements in function in a reasonable and predictable amount of time.     Precautions / Restrictions Precautions Precautions: Fall Restrictions Weight Bearing Restrictions: No      Mobility  Bed Mobility Overal bed mobility: Modified Independent             General bed mobility comments: takes extra time to come to edge of bed    Transfers Overall transfer level: Needs assistance   Transfers: Bed to chair/wheelchair/BSC       Squat pivot transfers: Min guard     General transfer comment: takes extra time; uses hands on chair arm  and hospital bed to assist    Ambulation/Gait               General Gait Details: did not attempt gait today  Stairs            Wheelchair Mobility    Modified Rankin (Stroke Patients Only)       Balance Overall balance assessment: Needs assistance   Sitting balance-Leahy Scale: Good Sitting balance -  Comments: good sitting balance on edge of bed                                     Pertinent Vitals/Pain Pain Assessment Pain Assessment: 0-10 Pain Score: 6  Pain Location: Right foot and ankle Pain Intervention(s): Limited activity within patient's tolerance, Monitored during session    Home Living Family/patient expects to be discharged to:: Private residence Living Arrangements: Alone Available Help at Discharge: Family;Available PRN/intermittently Type of Home: Mobile home Home Access: Ramped entrance       Home Layout: One level Home Equipment: Conservation officer, nature (2 wheels);Crutches;BSC/3in1;Wheelchair - manual;Cane - single point Additional Comments: has a cane in room    Prior Function Prior Level of Function : Independent/Modified Independent               ADLs Comments: pt reports independence with ADLs, sister in [place] orders groceries to his house     Hand Dominance   Dominant Hand: Left    Extremity/Trunk Assessment        Lower Extremity Assessment Lower Extremity Assessment: RLE deficits/detail;LLE deficits/detail RLE Deficits / Details: pain LLE Deficits / Details: L BKA    Cervical / Trunk Assessment Cervical / Trunk Assessment: Normal  Communication   Communication: No difficulties  Cognition Arousal/Alertness: Awake/alert Behavior During Therapy: WFL for tasks assessed/performed Overall Cognitive Status: Within Functional Limits for tasks assessed                                          General Comments General comments (skin integrity, edema, etc.): right foot bandaged    Exercises     Assessment/Plan    PT Assessment Patient needs continued PT services  PT Problem List Decreased strength;Decreased activity tolerance;Decreased safety awareness;Decreased skin integrity;Decreased balance;Decreased mobility       PT Treatment Interventions Balance training;DME instruction;Gait training;Neuromuscular  re-education;Functional mobility training;Patient/family education;Therapeutic activities;Therapeutic exercise    PT Goals (Current goals can be found in the Care Plan section)  Acute Rehab PT Goals Patient Stated Goal: return home PT Goal Formulation: With patient Time For Goal Achievement: 06/05/22 Potential to Achieve Goals: Good    Frequency Min 2X/week     Co-evaluation               AM-PAC PT "6 Clicks" Mobility  Outcome Measure Help needed turning from your back to your side while in a flat bed without using bedrails?: None Help needed moving from lying on your back to sitting on the side of a flat bed without using bedrails?: None Help needed moving to and from a bed to a chair (including a wheelchair)?: A Little Help needed standing up from a chair using your arms (e.g., wheelchair or bedside chair)?: A Little Help needed to walk in hospital room?: A Little Help needed climbing 3-5 steps with a railing? : A Lot 6  Click Score: 19    End of Session   Activity Tolerance: Patient tolerated treatment well;Patient limited by pain Patient left: in chair;with call bell/phone within reach Nurse Communication: Mobility status PT Visit Diagnosis: Unsteadiness on feet (R26.81);Other abnormalities of gait and mobility (R26.89);Muscle weakness (generalized) (M62.81)    Time: 0404-5913 PT Time Calculation (min) (ACUTE ONLY): 17 min   Charges:   PT Evaluation $PT Eval Low Complexity: 1 Low PT Treatments $Therapeutic Activity: 8-22 mins        11:15 AM, 05/22/22 Chanti Golubski Small Yaris Ferrell MPT  physical therapy [place] [phone]

## 2022-05-22 NOTE — Progress Notes (Signed)
Pt vaping while this RN administers cefepime.He was informed that vaping is not allowed in hospital. This RN offered a nicotine patch instead. He agrees. This RN reached out to Dr. Darrell Jewel and requested nicotine patch. Awaiting further orders. Joan Flores, RN

## 2022-05-22 NOTE — Assessment & Plan Note (Signed)
--   check MRI brain to rule out CVA, says present for past week per patient

## 2022-05-22 NOTE — Consult Note (Signed)
Reason for Consult: Osteomyelitis and cellulitis, right foot Referring Physician: Dr. Loyce Dys is an 56 y.o. male.  HPI: Patient is a 56 year old white male with multiple medical problems who has had multiple lower extremity surgeries due to diabetes mellitus and peripheral vascular disease.  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  He had a right transmetatarsal amputation and February of this year by podiatry.  He presented to the emergency room with increasing swelling as approximately 4 weeks ago he stepped on a nail.  He does not recall any pain given his diabetic neuropathy.  It was intermittently becoming red and swollen.  He presented to [facility] for further evaluation and treatment.  An MRI of the right foot reveals extensive osteomyelitis involving multiple metatarsals of the right foot.  He also has an open ulceration distantly over the second metatarsal region.  He was started on IV antibiotics.  Past Medical History:  Diagnosis Date   Alcohol abuse    Alcoholic peripheral neuropathy ([place])    Anxiety    Arthritis    Asthma    followed by pcp   B12 deficiency    Depression    Diabetic neuropathy ([place])    Edema of both lower extremities    Essential tremor    neurologist-- dr patel--- due to alcohol abuse   GERD (gastroesophageal reflux disease)    History of cellulitis 2020   left lower leg;   recurrent 03-25-2020   History of esophageal stricture    s/p  dilatation 02-13-2018   History of osteomyelitis 2020   left great toe     Hypercholesterolemia    Hypertension    followed by pcp  (nuclear stress test 03-11-2014 low risk w/ no ischemia, ef 65%   Normocytic anemia    followed by pcp   (03-18-2020 had transfusion's 02/ 2021)   Osteomyelitis of great toe of right foot ([place]) 08/18/2021   Peripheral vascular disease ([place])    Severe depression ([place]) 06/09/2021   Status post incision and drainage followed by Dr  [name] and pcp   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   (03-17-2020  pt states most of incision is healed with exception an area that is still draining,  daily dressing change),  foot is red but not warm to the touch and has swelling but has improved)   Suicidal ideation 06/09/2021   Thrombocytopenia ([place])    chronic   Type 2 diabetes mellitus ([place])    followed by pcp---    (03-18-2020  pt stated does not check blood sugar)    Past Surgical History:  Procedure Laterality Date   AMPUTATION Left 10/16/2019   Procedure: Left partial second ray resection; placement of antibiotic beads;  Surgeon: Evelina Bucy, DPM;  [facility];  Service: Podiatry;  Laterality: Left;   AMPUTATION Left 11/13/2019   Procedure: Left Midfoot Amputation - Transmetatarsal vs. Lisfranc; Placement antibiotic beads;  Surgeon: Evelina Bucy, DPM;  [facility];  Service: Podiatry;  Laterality: Left;   AMPUTATION Left 12/21/2019   Procedure: Chopart Amputation left foot;  Surgeon: Evelina Bucy, DPM;  Location: [place];  Service: Podiatry;  Laterality: Left;   AMPUTATION Left 06/24/2020   Procedure: LEFT BELOW KNEE AMPUTATION;  Surgeon: Newt Minion, MD;  Location: [place];  Service: Orthopedics;  Laterality: Left;   AMPUTATION Right 03/29/2021   Procedure: AMPUTATION RAY;  Surgeon: Evelina Bucy, DPM;  Location: [place];  Service: Podiatry;  Laterality: Right;   AMPUTATION TOE Left 08/14/2019   Procedure: AMPUTATION GREAT TOE;  Surgeon: Evelina Bucy, DPM;  [facility];  Service: Podiatry;  Laterality: Left;   APPLICATION OF WOUND VAC Left 12/21/2019   Procedure: Application Of Wound Vac;  Surgeon: Evelina Bucy, DPM;  Location: [place];  Service: Podiatry;  Laterality: Left;   BIOPSY  02/13/2018   Procedure: BIOPSY;  Surgeon: Danie Binder, MD;  [facility];  Service: Endoscopy;;  transverse colon biopsy, gastric biopsy   COLONOSCOPY WITH PROPOFOL N/A 02/13/2018   Procedure:  COLONOSCOPY WITH PROPOFOL;  Surgeon: Danie Binder, MD;  [facility];  Service: Endoscopy;  Laterality: N/A;  11:15am   ESOPHAGOGASTRODUODENOSCOPY (EGD) WITH PROPOFOL N/A 02/13/2018   Procedure: ESOPHAGOGASTRODUODENOSCOPY (EGD) WITH PROPOFOL;  Surgeon: Danie Binder, MD;  [facility];  Service: Endoscopy;  Laterality: N/A;   I & D EXTREMITY Left 07/16/2019   Procedure: Insicion  AND DEBRIDEMENT EXTREMITY;  Surgeon: Evelina Bucy, DPM;  Location: [place];  Service: Podiatry;  Laterality: Left;   I & D EXTREMITY Left 06/22/2020   Procedure: IRRIGATION AND DEBRIDEMENT EXTREMITY, left foot and ankle;  Surgeon: Evelina Bucy, DPM;  Location: [place];  Service: Podiatry;  Laterality: Left;   I & D EXTREMITY Right 03/29/2021   Procedure: IRRIGATION AND DEBRIDEMENT EXTREMITY;  Surgeon: Evelina Bucy, DPM;  Location: [place];  Service: Podiatry;  Laterality: Right;   INCISION AND DRAINAGE OF WOUND Left 10/13/2019   Procedure: IRRIGATION AND DEBRIDEMENT WOUND OF LEFT FOOT AND FIRST METATARSAL RESECTION;  Surgeon: Evelina Bucy, DPM;  [facility];  Service: Podiatry;  Laterality: Left;   IRRIGATION AND DEBRIDEMENT FOOT Left 11/11/2019   Procedure: Left Foot Wound Irrigation and Debridement;  Surgeon: Evelina Bucy, DPM;  [facility];  Service: Podiatry;  Laterality: Left;   Left arm     Left arm repair (tendon and artery)   PERCUTANEOUS PINNING  07/16/2019   Procedure: Open Reduction Percutaneous Pinning Extremity;  Surgeon: Evelina Bucy, DPM;  Location: [place];  Service: Podiatry;;   PILONIDAL CYST EXCISION N/A 08/08/2014   Procedure: CYST EXCISION PILONIDAL EXTENSIVE;  Surgeon: Jamesetta So, MD;  [facility];  Service: General;  Laterality: N/A;   POLYPECTOMY  02/13/2018   Procedure: POLYPECTOMY;  Surgeon: Danie Binder, MD;  [facility];  Service: Endoscopy;;  transverse colon polyp hs, rectal polyps times 2   SAVORY DILATION N/A 02/13/2018    Procedure: SAVORY DILATION;  Surgeon: Danie Binder, MD;  [facility];  Service: Endoscopy;  Laterality: N/A;   TENDON LENGTHENING Right 12/04/2021   Procedure: TENDON LENGTHENING;  Surgeon: Landis Martins, DPM;  Location: [place];  Service: Podiatry;  Laterality: Right;   TRANSMETATARSAL AMPUTATION Right 12/04/2021   Procedure: TRANSMETATARSAL AMPUTATION;  Surgeon: Landis Martins, DPM;  Location: [place];  Service: Podiatry;  Laterality: Right;   WOUND DEBRIDEMENT Left 09/04/2019   Procedure: DEBRIDEMENT WOUND WITH COMPLEX  REPAIR OF DEHISCENCE;  Surgeon: Evelina Bucy, DPM;  Location: [place];  Service: Podiatry;  Laterality: Left;  Leave patient on stretcher   WOUND DEBRIDEMENT Left 10/16/2019   Procedure: Left foot wound debridement and closure;  Surgeon: Evelina Bucy, DPM;  [facility];  Service: Podiatry;  Laterality: Left;   WOUND DEBRIDEMENT Left 12/18/2019   Procedure: LEFT FOOT DEBRIDEMENT WITH  PARTIAL INCISION OF INFECTED BONE;  Surgeon: Evelina Bucy, DPM;  Location: [place];  Service: Podiatry;  Laterality: Left;  LEFT FOOT DEBRIDEMENT WITH PARTIAL INCISION OF INFECTED BONE   WOUND DEBRIDEMENT Right 04/01/2021   Procedure: Right foot wound debridement and irrigation and closure, bone biopsy;  Surgeon: Evelina Bucy, DPM;  Location: [place];  Service: Podiatry;  Laterality: Right;    Family History  Problem Relation Age of Onset   Heart attack Mother        Living, 84   Cancer Father        Deceased   Healthy Brother    Healthy Sister    Colon cancer Neg Hx    Colon polyps Neg Hx     Social History:  reports that he quit smoking about 37 years ago. His smoking use included cigarettes. He has a 62.50 pack-year smoking history. He has never used smokeless tobacco. He reports that he does not currently use alcohol after a past usage of about 50.0 standard drinks of alcohol per week. He reports that he does not currently use drugs after  having used the following drugs: Marijuana.  Allergies: No Known Allergies  Medications: I have reviewed the patient's current medications. Prior to Admission:  Medications Prior to Admission  Medication Sig Dispense Refill Last Dose   acetaminophen (TYLENOL) 325 MG tablet Take 2 tablets (650 mg total) by mouth every 6 (six) hours as needed for mild pain.   05/20/2022   albuterol (VENTOLIN HFA) 108 (90 Base) MCG/ACT inhaler Inhale 1-2 puffs into the lungs every 4 (four) hours as needed for wheezing or shortness of breath.   05/20/2022   ALPRAZolam (XANAX) 1 MG tablet Take 1 mg by mouth 4 (four) times daily.   05/20/2022   amLODipine (NORVASC) 10 MG tablet Take 10 mg by mouth daily.   05/20/2022   diclofenac Sodium (VOLTAREN) 1 % GEL Apply 4 g topically 4 (four) times daily as needed (Apply to painful area over left upper front chest.). 50 g 0 Past Month   folic acid (FOLVITE) 1 MG tablet Take 1 tablet (1 mg total) by mouth daily. 30 tablet 0 Past Month   metFORMIN (GLUCOPHAGE) 500 MG tablet Take 500 mg by mouth 2 (two) times daily.   05/20/2022   morphine (MS CONTIN) 30 MG 12 hr tablet Take 30 mg by mouth every 12 (twelve) hours.   05/20/2022   Multiple Vitamin (MULTIVITAMIN WITH MINERALS) TABS tablet Take 1 tablet by mouth daily.   Past Month   oxycodone (ROXICODONE) 30 MG immediate release tablet Take 30 mg by mouth 4 (four) times daily as needed.   05/20/2022   thiamine 100 MG tablet Take 1 tablet (100 mg total) by mouth daily. 30 tablet 0 05/20/2022   hydrochlorothiazide (HYDRODIURIL) 12.5 MG tablet Take 1 tablet (12.5 mg total) by mouth daily. (Patient not taking: Reported on 05/20/2022) 30 tablet 1 Not Taking   irbesartan (AVAPRO) 150 MG tablet Take 1 tablet (150 mg total) by mouth daily. (Patient not taking: Reported on 05/20/2022) 30 tablet 1 Not Taking   lisinopril (ZESTRIL) 40 MG tablet Take 40 mg by mouth daily. (Patient not taking: Reported on 05/20/2022)   Not Taking    Results for orders  placed or performed during the hospital encounter of 05/20/22 (from the past 48 hour(s))  Lactic acid, plasma     Status: None   Collection Time: 05/20/22  3:56 PM  Result Value Ref Range  Lactic Acid, Venous 1.0 0.5 - 1.9 mmol/L    Comment: Performed at [facility], [address] [place], [place] [address]  Comprehensive metabolic panel     Status: Abnormal   Collection Time: 05/20/22  3:56 PM  Result Value Ref Range   Sodium 142 135 - 145 mmol/L   Potassium 3.7 3.5 - 5.1 mmol/L   Chloride 111 98 - 111 mmol/L   CO2 25 22 - 32 mmol/L   Glucose, Bld 105 (H) 70 - 99 mg/dL    Comment: Glucose reference range applies only to samples taken after fasting for at least 8 hours.   BUN 26 (H) 6 - 20 mg/dL   Creatinine, Ser 0.96 0.61 - 1.24 mg/dL   Calcium 8.7 (L) 8.9 - 10.3 mg/dL   Total Protein 6.6 6.5 - 8.1 g/dL   Albumin 3.1 (L) 3.5 - 5.0 g/dL   AST 24 15 - 41 U/L   ALT 12 0 - 44 U/L   Alkaline Phosphatase 78 38 - 126 U/L   Total Bilirubin 0.6 0.3 - 1.2 mg/dL   GFR, Estimated >60 >60 mL/min    Comment: (NOTE) Calculated using the CKD-EPI Creatinine Equation (2021)    Anion gap 6 5 - 15    Comment: Performed at [facility], [address] [place], [place] [address]  CBC with Differential     Status: Abnormal   Collection Time: 05/20/22  3:56 PM  Result Value Ref Range   WBC 7.8 4.0 - 10.5 K/uL   RBC 4.34 4.22 - 5.81 MIL/uL   Hemoglobin 13.8 13.0 - 17.0 g/dL   HCT 41.3 39.0 - 52.0 %   MCV 95.2 80.0 - 100.0 fL   MCH 31.8 26.0 - 34.0 pg   MCHC 33.4 30.0 - 36.0 g/dL   RDW 15.4 11.5 - 15.5 %   Platelets 161 150 - 400 K/uL   nRBC 0.0 0.0 - 0.2 %   Neutrophils Relative % 53 %   Neutro Abs 4.2 1.7 - 7.7 K/uL   Lymphocytes Relative 29 %   Lymphs Abs 2.2 0.7 - 4.0 K/uL   Monocytes Relative 5 %   Monocytes Absolute 0.4 0.1 - 1.0 K/uL   Eosinophils Relative 11 %   Eosinophils Absolute 0.9 (H) 0.0 - 0.5 K/uL   Basophils Relative 1 %   Basophils Absolute 0.1 0.0 - 0.1 K/uL    Immature Granulocytes 1 %   Abs Immature Granulocytes 0.04 0.00 - 0.07 K/uL    Comment: Performed at [facility], [address], [place], [place] [address]  Protime-INR     Status: None   Collection Time: 05/20/22  3:56 PM  Result Value Ref Range   Prothrombin Time 13.6 11.4 - 15.2 seconds   INR 1.1 0.8 - 1.2    Comment: (NOTE) INR goal varies based on device and disease states. Performed at [facility], [address] [place], [place] [address]   APTT     Status: None   Collection Time: 05/20/22  3:56 PM  Result Value Ref Range   aPTT 32 24 - 36 seconds    Comment: Performed at [facility], [address], [place],  [address]  Blood Culture (routine x 2)     Status: None (Preliminary result)   Collection Time: 05/20/22  3:56 PM   Specimen: Right Antecubital; Blood  Result Value Ref Range   Specimen Description      RIGHT ANTECUBITAL BOTTLES DRAWN AEROBIC AND ANAEROBIC   Special  Requests      Blood Culture results may not be optimal due to an excessive volume of blood received in culture bottles   Culture      NO GROWTH 2 DAYS Performed at [facility], [address], [place], [place] [address]    Report Status PENDING   Blood Culture (routine x 2)     Status: None (Preliminary result)   Collection Time: 05/20/22  3:56 PM   Specimen: BLOOD LEFT FOREARM  Result Value Ref Range   Specimen Description      BLOOD LEFT FOREARM BOTTLES DRAWN AEROBIC AND ANAEROBIC   Special Requests Blood Culture adequate volume    Culture      NO GROWTH 2 DAYS Performed at [facility], [address] [place], [place] [address]    Report Status PENDING   Hemoglobin A1c     Status: None   Collection Time: 05/20/22  3:56 PM  Result Value Ref Range   Hgb A1c MFr Bld 5.1 4.8 - 5.6 %    Comment: (NOTE) Pre diabetes:          5.7%-6.4%  Diabetes:              >6.4%  Glycemic control for   <7.0% adults with diabetes    Mean Plasma Glucose 99.67 mg/dL    Comment: Performed at [facility] [address] [place], [place] [address]  Lactic acid, plasma     Status: None   Collection Time: 05/20/22  6:44 PM  Result Value Ref Range   Lactic Acid, Venous 1.1 0.5 - 1.9 mmol/L    Comment: Performed at [facility], [address], [place], [place] [address]  Sedimentation rate     Status: None   Collection Time: 05/20/22  6:44 PM  Result Value Ref Range   Sed Rate 16 0 - 16 mm/hr    Comment: Performed at [facility], [address] [place], [place] [address]  C-reactive protein     Status: Abnormal   Collection Time: 05/20/22  6:44 PM  Result Value Ref Range   CRP 2.3 (H) <1.0 mg/dL    Comment: Performed at [facility], [place] [address], [place],  [address]  Prealbumin     Status: Abnormal   Collection Time: 05/20/22  6:44 PM  Result Value Ref Range   Prealbumin 14 (L) 18 - 38 mg/dL    Comment: Performed at [facility] [address] [place], [place] [address]  Glucose, capillary     Status: Abnormal   Collection Time: 05/20/22  9:15 PM  Result Value Ref Range   Glucose-Capillary 115 (H) 70 - 99 mg/dL    Comment: Glucose reference range applies only to samples taken after fasting for at least 8 hours.  Basic metabolic panel     Status: Abnormal   Collection Time: 05/21/22  5:37 AM  Result Value Ref Range   Sodium 141 135 - 145 mmol/L   Potassium 3.9 3.5 - 5.1 mmol/L   Chloride 113 (H) 98 - 111 mmol/L   CO2 23 22 - 32 mmol/L   Glucose, Bld 99 70 - 99 mg/dL    Comment: Glucose reference range applies only to samples taken after fasting for at least 8 hours.   BUN 16 6 - 20 mg/dL   Creatinine, Ser 0.63 0.61 - 1.24 mg/dL   Calcium 8.3 (L) 8.9 - 10.3 mg/dL   GFR, Estimated >60 >60 mL/min  Comment: (NOTE) Calculated using the CKD-EPI Creatinine Equation (2021)    Anion gap 5 5 - 15    Comment: Performed at [facility], [address], [place], [place] [address]  CBC     Status: Abnormal   Collection Time: 05/21/22  5:37 AM  Result  Value Ref Range   WBC 5.9 4.0 - 10.5 K/uL   RBC 4.32 4.22 - 5.81 MIL/uL   Hemoglobin 13.8 13.0 - 17.0 g/dL   HCT 41.5 39.0 - 52.0 %   MCV 96.1 80.0 - 100.0 fL   MCH 31.9 26.0 - 34.0 pg   MCHC 33.3 30.0 - 36.0 g/dL   RDW 15.4 11.5 - 15.5 %   Platelets 141 (L) 150 - 400 K/uL   nRBC 0.0 0.0 - 0.2 %    Comment: Performed at [facility], [address] [place], [place] [address]  HIV Antibody (routine testing w rflx)     Status: None   Collection Time: 05/21/22  5:37 AM  Result Value Ref Range   HIV Screen 4th Generation wRfx Non Reactive Non Reactive    Comment: Performed at [facility], [place] [address], [place], [place] [address]  Glucose, capillary     Status: Abnormal   Collection Time: 05/21/22  7:57 AM  Result Value Ref Range   Glucose-Capillary 107 (H) 70 - 99 mg/dL    Comment: Glucose reference range applies only to samples taken after fasting for at least 8 hours.  Glucose, capillary     Status: Abnormal   Collection Time: 05/21/22 11:54 AM  Result Value Ref Range   Glucose-Capillary 140 (H) 70 - 99 mg/dL    Comment: Glucose reference range applies only to samples taken after fasting for at least 8 hours.  Glucose, capillary     Status: Abnormal   Collection Time: 05/21/22  4:05 PM  Result Value Ref Range   Glucose-Capillary 164 (H) 70 - 99 mg/dL    Comment: Glucose reference range applies only to samples taken after fasting for at least 8 hours.   Comment 1 Notify RN    Comment 2 Document in Chart   Glucose, capillary     Status: Abnormal   Collection Time: 05/21/22  8:30 PM  Result Value Ref Range   Glucose-Capillary 204 (H) 70 - 99 mg/dL    Comment: Glucose reference range applies only to samples taken after fasting for at least 8 hours.  CBC     Status: Abnormal   Collection Time: 05/22/22  6:30 AM  Result Value Ref Range   WBC 5.3 4.0 - 10.5 K/uL   RBC 4.16 (L) 4.22 - 5.81 MIL/uL   Hemoglobin 13.2 13.0 - 17.0 g/dL   HCT 39.8 39.0 - 52.0 %   MCV 95.7 80.0  - 100.0 fL   MCH 31.7 26.0 - 34.0 pg   MCHC 33.2 30.0 - 36.0 g/dL   RDW 15.2 11.5 - 15.5 %   Platelets 144 (L) 150 - 400 K/uL   nRBC 0.0 0.0 - 0.2 %    Comment: Performed at [facility], [address] [place], [place] [address]    MR FOOT RIGHT W WO CONTRAST  Result Date: 05/20/2022 CLINICAL DATA:  Diabetic.  Foot swelling.  Osteomyelitis suspected. EXAM: MRI OF THE RIGHT FOREFOOT WITHOUT AND WITH CONTRAST TECHNIQUE: Multiplanar, multisequence MR imaging of the right forefoot was performed before and after the administration of intravenous contrast. CONTRAST:  22m GADAVIST GADOBUTROL 1 MMOL/ML IV SOLN COMPARISON:  right ankle and foot; right foot radiographs 12/04/2021; MRI right forefoot radiographs 05/20/2022 FINDINGS: Bones/Joint/Cartilage Postsurgical changes are again seen of amputation of the first through fifth rays at the level of the proximal to mid metatarsal shafts. Given the recent surgery 12/2021 there is expected lack of cortex at the distal aspect of the each second through fifth metatarsal amputation site limiting evaluation for new cortical erosion and osteomyelitis. Nevertheless, there is marrow edema within multiple metatarsals concerning for acute osteomyelitis. This marrow edema is greatest within the third and fourth metatarsals wear involves the majority of the metatarsal shafts (coronal series 10 images 20 through 23). There is also moderate to high-grade marrow edema throughout the majority of the remaining fifth metatarsal shaft (coronal series 10 images 22 and 23 and axial series 7, images 18 through 23). There is also trace marrow edema at the distal aspect of the more chronic first metatarsal amputation stump (coronal series 10 image 19). Given the edema and swelling of the soft tissues around the first through fifth distal amputation stumps, this marrow edema remains concerning for osteomyelitis. Ligaments The Lisfranc ligament complex appears intact. Muscles and Tendons  There is mild-to-moderate diffuse edema within the extensor digitorum brevis and the abductor hallucis longus and flexor digitorum brevis muscles, nonspecific myositis. Soft tissues There is moderate medial and lateral ankle and dorsal foot subcutaneous fat soft tissue swelling and edema. There is more mild edema and swelling just distal to the first through fifth metatarsal amputation stumps. There is ulceration within the soft tissues at the plantar medial aspect of the postsurgical forefoot, distal and medial to the first metatarsal amputation site (coronal series 10, image 25; axial series 7, image 8, and sagittal series 8, image 25). IMPRESSION: 1. There is a new soft tissue ulcer within the medial forefoot just distal plantar coronal and medial to the first metatarsal amputation stump. There is edema and swelling of the ankle and dorsal midfoot soft tissues extending anterior to the first through fifth amputation stumps. 2. As described above, there is marrow edema within the third and fourth greater than fifth remaining metatarsal shafts and very mild marrow edema within the distal aspect of the remaining first and second metatarsal shafts. These findings remain concerning for osteomyelitis involving these regions of the metatarsals, again greatest within the third through fifth metatarsal shafts. Electronically Signed   By: Yvonne Kendall M.D.   On: 05/20/2022 19:31   DG Tibia/Fibula Right  Result Date: 05/20/2022 CLINICAL DATA:  Right lower leg pain and swelling. EXAM: RIGHT TIBIA AND FIBULA - 2 VIEW COMPARISON:  None Available. FINDINGS: There is no evidence of fracture or other focal bone lesions. Soft tissues are unremarkable. IMPRESSION: Negative. Electronically Signed   By: Marijo Conception M.D.   On: 05/20/2022 16:27   DG Ankle Complete Right  Result Date: 05/20/2022 CLINICAL DATA:  Right foot pain and swelling. EXAM: RIGHT ANKLE - COMPLETE 3+ VIEW COMPARISON:  None Available. FINDINGS: There is  no evidence of fracture, dislocation, or joint effusion. There is no evidence of arthropathy or other focal bone abnormality. Soft tissues are unremarkable. IMPRESSION: Negative. Electronically Signed   By: Marijo Conception M.D.   On: 05/20/2022 16:26   DG Foot Complete Right  Result Date: 05/20/2022 CLINICAL DATA:  Right foot wound. EXAM: RIGHT FOOT COMPLETE - 3+ VIEW COMPARISON:  December 04, 2021. FINDINGS: Status post surgical amputation at the level of the proximal metatarsals. No definite lytic destruction is seen to suggest osteomyelitis.  Large soft tissue ulceration is seen anterior to the residual first metatarsal. IMPRESSION: Large soft tissue ulceration is noted. No definite evidence of osteomyelitis is noted. Postsurgical changes as described above. Electronically Signed   By: Marijo Conception M.D.   On: 05/20/2022 16:25   DG Chest Port 1 View  Result Date: 05/20/2022 CLINICAL DATA:  Sepsis. EXAM: PORTABLE CHEST 1 VIEW COMPARISON:  Mar 01, 2022. FINDINGS: The heart size and mediastinal contours are within normal limits. Both lungs are clear. The visualized skeletal structures are unremarkable. IMPRESSION: No active disease. Electronically Signed   By: Marijo Conception M.D.   On: 05/20/2022 16:23    ROS:  Pertinent items are noted in HPI.  Blood pressure 123/66, pulse 62, temperature 98.1 F (36.7 C), resp. rate 18, height '6\' 2"'$  (1.88 m), weight 119.3 kg, SpO2 95 %. Physical Exam: Pleasant well-developed well-nourished white male no acute distress Head is normocephalic, atraumatic Lungs clear to auscultation with equal breath sounds bilaterally Heart examination reveals a regular rate and rhythm without S3, S4, murmurs Extremity: Left BKA.  Right foot with open granulating wound over the second metatarsal with a healed surgical scar present.  Pretibial erythema and edema are noted halfway up the leg.  Difficult to palpate dorsalis pedis and posterior tibial pulses due to edema.  MRI  results reviewed  Assessment/Plan: Impression: Osteomyelitis involving multiple metatarsal bones of the right foot.  Status post transmetatarsal amputation of the right foot by podiatry in [place]. Plan: I had extensive discussion with the patient concerning these findings.  He has had a PICC line and IV antibiotics in the past.  He would like to start this prior to any further surgical intervention.  He will follow-up with podiatry as an outpatient.  I did tell him that he may need further surgical intervention should the IV antibiotics do not work.  He understands this.  No need for acute surgical intervention at this time.  Aviva Signs 05/22/2022, 7:42 AM

## 2022-05-22 NOTE — Plan of Care (Signed)
  Problem: Acute Rehab PT Goals(only PT should resolve) Goal: Pt Will Go Supine/Side To Sit Outcome: Progressing Flowsheets (Taken 05/22/2022 1116) Pt will go Supine/Side to Sit: Independently Goal: Patient Will Transfer Sit To/From Stand Outcome: Progressing Flowsheets (Taken 05/22/2022 1116) Patient will transfer sit to/from stand: with supervision Goal: Pt Will Transfer Bed To Chair/Chair To Bed Outcome: Progressing Flowsheets (Taken 05/22/2022 1116) Pt will Transfer Bed to Chair/Chair to Bed: with supervision Goal: Pt Will Ambulate Outcome: Progressing Flowsheets (Taken 05/22/2022 1116) Pt will Ambulate:  25 feet  with min guard assist  with least restrictive assistive device

## 2022-05-22 NOTE — Progress Notes (Addendum)
PROGRESS NOTE   Eugene Diaz  EXB:[id number] DOB: [birth year] DOA: 05/20/2022 PCP: Redmond School, MD   Chief Complaint  Patient presents with   Foot Injury   Level of care: Telemetry  Brief Admission History:  56 y.o. 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 -Patient presents today secondary to left foot wound, he is status post right TMA by Dr. March Diaz May of this year, sister-in-law provides most of the history, he is noncompliant with his medications, decreased his alcohol consumption significantly, no smoking, noncompliant with medication,. -Patient reports 1 month ago he got his foot caught on a nail that was sticking out of his porch, does not recall pain given his severe neuropathy, reports intermittent redness, and swelling, but this has worsened recently, family had to convince the patient to come to ED given worsening flexion of his feet, he denies nausea, vomiting, fever, chills or pain. -In ED he was afebrile, no leukocytosis, lactic acid within normal limit, but his foot was noted to be significantly warm swollen and erythematous, with wound in the medial side of his foot, [facility] hospitalist consulted to admit     Assessment and Plan: Osteomyelitis Right stump /Cellulitis Right stump/ Infected diabetic ulcer of the right foot Osteomyelitis of transmetarsal bones right foot  -Blood cultures were sent - no growth to date x 2 days -X-ray with no evidence of osteomyelitis, MRI is suggesting osteomyelitis -Started on broad-spectrum antibiotics, given he had a nail injury we will proceed with cefepime to cover for Pseudomonas infection, giving recurrent hospitalization in the past we will keep on vancomycin as well, continue with Flagyl. -Pt discussed with surgeon, pt would like to try long course of IV  antibiotics with PICC line before would consider surgery.  -Will ask ID service to see on 7/24 for recommendations.  -Hopefully PICC line can be placed on 7/24 if blood cultures negative x 3 days   Type 2 diabetes mellitus with peripheral neuropathy ([place]) -He is on metformin, will hold and will keep an insulin sliding scale. - A1c pending  CBG (last 3)  Recent Labs    05/22/22 0739 05/22/22 1141 05/22/22 1713  GLUCAP 109* 162* 138*    HTN (hypertension) -Compliant with his home medications, not taking any, his blood pressures currently acceptable, so we will start back on home meds when blood pressure started to increase   Hyperlipidemia associated with type 2 diabetes mellitus Not on statins prior to arrival   Tobacco abuse He was counseled and will offer nicotine patch   Alcohol use disorder Currently drinking half fifth of liquor over 1 week which is significant action from his previous . -Continue with thiamine and folic acid . -We will monitor closely for withdrawals, will keep on CIWA protocol -librium 5 mg TID ordered    Chronic pain syndrome -On significant dose of narcotics, this was reviewed via PMP portal, and was resumed   Body mass index is 34.02 kg/m.     DVT Prophylaxis  Lovenox - SCDs  Code Status: Full  Family Communication:  Disposition: Status is: Inpatient Remains inpatient appropriate because: IV antibiotics required    Consultants:  Surgery ID service  Procedures:   Antimicrobials:    Subjective: Pt reports no symptoms of alcohol withdrawal at this time.    Objective: Vitals:   05/21/22 2033 05/22/22 0201 05/22/22 0532 05/22/22 1436  BP: 129/78 140/68 123/66 135/68  Pulse: 61 (!) 59 62 66  Resp: '20 17 18 20  '$ Temp: 97.9 F (36.6 C) 97.8 F (36.6 C) 98.1 F (36.7 C) 98.2 F (36.8 C)  TempSrc: Oral Oral  Oral  SpO2: 98% 96% 95% 97%  Weight:      Height:        Intake/Output Summary (Last 24 hours) at 05/22/2022 1856 Last data  filed at 05/22/2022 1700 Gross per 24 hour  Intake 1942.22 ml  Output 3675 ml  Net -1732.78 ml   Filed Weights   05/20/22 1508 05/20/22 2020  Weight: 120.2 kg 119.3 kg   Examination:  General exam: Appears calm and comfortable  Respiratory system: Clear to auscultation. Respiratory effort normal. Cardiovascular system: normal S1 & S2 heard. No JVD, murmurs, rubs, gallops or clicks. No pedal edema. Gastrointestinal system: Abdomen is nondistended, soft and nontender. No organomegaly or masses felt. Normal bowel sounds heard. Central nervous system: Alert and oriented. No focal neurological deficits. Extremities: cellulitis right foot stump. Skin: No rashes, lesions or ulcers. Psychiatry: Judgement and insight appear normal. Mood & affect appropriate.   Data Reviewed: I have personally reviewed following labs and imaging studies  CBC: Recent Labs  Lab 05/20/22 1556 05/21/22 0537 05/22/22 0630  WBC 7.8 5.9 5.3  NEUTROABS 4.2  --   --   HGB 13.8 13.8 13.2  HCT 41.3 41.5 39.8  MCV 95.2 96.1 95.7  PLT 161 141* 144*    Basic Metabolic Panel: Recent Labs  Lab 05/20/22 1556 05/21/22 0537 05/22/22 0630  NA 142 141 137  K 3.7 3.9 3.6  CL 111 113* 108  CO2 '25 23 25  '$ GLUCOSE 105* 99 105*  BUN 26* 16 8  CREATININE 0.96 0.63 0.62  CALCIUM 8.7* 8.3* 8.8*  MG  --   --  1.6*    CBG: Recent Labs  Lab 05/21/22 1605 05/21/22 2030 05/22/22 0739 05/22/22 1141 05/22/22 1713  GLUCAP 164* 204* 109* 162* 138*    Recent Results (from the past 240 hour(s))  Blood Culture (routine x 2)     Status: None (Preliminary result)   Collection Time: 05/20/22  3:56 PM   Specimen: Right Antecubital; Blood  Result Value Ref Range Status   Specimen Description   Final    RIGHT ANTECUBITAL BOTTLES DRAWN AEROBIC AND ANAEROBIC   Special Requests   Final    Blood Culture results may not be optimal due to an excessive volume of blood received in culture bottles   Culture   Final    NO  GROWTH 2 DAYS Performed at [facility], [address], [place], [place] [address]    Report Status PENDING  Incomplete  Blood Culture (routine x 2)     Status: None (Preliminary result)   Collection Time: 05/20/22  3:56 PM   Specimen: BLOOD LEFT FOREARM  Result Value Ref Range Status   Specimen Description   Final    BLOOD LEFT FOREARM BOTTLES DRAWN AEROBIC AND ANAEROBIC   Special Requests Blood Culture adequate volume  Final   Culture   Final    NO GROWTH 2 DAYS Performed at [facility], [address], [place], [place] [address]    Report Status PENDING  Incomplete     Radiology Studies: No results found.  Scheduled Meds:  (feeding supplement) PROSource Plus  30 mL Oral BID BM   chlordiazePOXIDE  5 mg Oral TID   enoxaparin (LOVENOX) injection  40 mg Subcutaneous Q24H  feeding supplement  237 mL Oral Q28M   folic acid  1 mg Oral Daily   insulin aspart  0-15 Units Subcutaneous TID WC   insulin aspart  0-5 Units Subcutaneous QHS   metroNIDAZOLE  500 mg Oral Q12H   morphine  30 mg Oral Q12H   multivitamin with minerals  1 tablet Oral Daily   nicotine  21 mg Transdermal Daily   nutrition supplement (JUVEN)  1 packet Oral BID BM   thiamine  100 mg Oral Daily   Continuous Infusions:  ceFEPime (MAXIPIME) IV 2 g (05/22/22 1813)   lactated ringers 65 mL/hr at 05/22/22 0538   vancomycin 1,250 mg (05/22/22 0818)    LOS: 2 days   Time spent: 35 mins  Nikelle Malatesta Wynetta Emery, MD How to contact the [facility] Attending or Consulting provider [place] or covering provider during after hours [place], for this patient?  Check the care team in [facility] and look for a) attending/consulting [facility] provider listed and b) the [facility] team listed Log into [email or web address] and use [place]'s universal password to access. If you do not have the password, please contact the hospital operator. Locate the [facility] provider you are looking for under [facility] and page to a number that you can be directly reached. If  you still have difficulty reaching the provider, please page the [facility] (Director on Call) for the Hospitalists listed on amion for assistance.  05/22/2022, 6:56 PM

## 2022-05-23 ENCOUNTER — Encounter (HOSPITAL_COMMUNITY): Payer: Self-pay | Admitting: Internal Medicine

## 2022-05-23 ENCOUNTER — Inpatient Hospital Stay (HOSPITAL_COMMUNITY): Payer: Medicare Other

## 2022-05-23 ENCOUNTER — Inpatient Hospital Stay: Payer: Self-pay

## 2022-05-23 DIAGNOSIS — F1010 Alcohol abuse, uncomplicated: Secondary | ICD-10-CM | POA: Diagnosis not present

## 2022-05-23 DIAGNOSIS — E1142 Type 2 diabetes mellitus with diabetic polyneuropathy: Secondary | ICD-10-CM

## 2022-05-23 DIAGNOSIS — D638 Anemia in other chronic diseases classified elsewhere: Secondary | ICD-10-CM | POA: Diagnosis not present

## 2022-05-23 DIAGNOSIS — K219 Gastro-esophageal reflux disease without esophagitis: Secondary | ICD-10-CM | POA: Diagnosis not present

## 2022-05-23 DIAGNOSIS — E1169 Type 2 diabetes mellitus with other specified complication: Secondary | ICD-10-CM | POA: Diagnosis not present

## 2022-05-23 DIAGNOSIS — M86371 Chronic multifocal osteomyelitis, right ankle and foot: Secondary | ICD-10-CM

## 2022-05-23 DIAGNOSIS — E1141 Type 2 diabetes mellitus with diabetic mononeuropathy: Secondary | ICD-10-CM | POA: Diagnosis not present

## 2022-05-23 DIAGNOSIS — T148XXA Other injury of unspecified body region, initial encounter: Secondary | ICD-10-CM | POA: Diagnosis not present

## 2022-05-23 HISTORY — DX: Chronic multifocal osteomyelitis, right ankle and foot: M86.371

## 2022-05-23 LAB — BASIC METABOLIC PANEL
Anion gap: 3 — ABNORMAL LOW (ref 5–15)
BUN: 11 mg/dL (ref 6–20)
CO2: 27 mmol/L (ref 22–32)
Calcium: 8.9 mg/dL (ref 8.9–10.3)
Chloride: 109 mmol/L (ref 98–111)
Creatinine, Ser: 0.62 mg/dL (ref 0.61–1.24)
GFR, Estimated: >60 mL/min (ref >60)
Glucose, Bld: 115 mg/dL — ABNORMAL HIGH (ref 70–99)
Potassium: 3.9 mmol/L (ref 3.5–5.1)
Sodium: 139 mmol/L (ref 135–145)

## 2022-05-23 LAB — GLUCOSE, CAPILLARY
Glucose-Capillary: 113 mg/dL — ABNORMAL HIGH (ref 70–99)
Glucose-Capillary: 221 mg/dL — ABNORMAL HIGH (ref 70–99)
Glucose-Capillary: 134 mg/dL — ABNORMAL HIGH (ref 70–99)
Glucose-Capillary: 143 mg/dL — ABNORMAL HIGH (ref 70–99)

## 2022-05-23 MED ORDER — SODIUM CHLORIDE 0.9 % IV SOLN
1.0000 g | INTRAVENOUS | Status: DC
  Administered 2022-05-23: 1000 mg via INTRAVENOUS
  Filled 2022-05-23 (×2): qty 1

## 2022-05-23 MED ORDER — DOXYCYCLINE HYCLATE 100 MG PO TABS
100.0000 mg | ORAL_TABLET | Freq: Two times a day (BID) | ORAL | Status: DC
  Administered 2022-05-23 – 2022-05-24 (×3): 100 mg via ORAL
  Filled 2022-05-23 (×3): qty 1

## 2022-05-23 NOTE — Progress Notes (Signed)
Patient has necklace with his wife's ashes in his possession at this time

## 2022-05-23 NOTE — Consult Note (Addendum)
Virtual Visit via Video Note  I connected with Eugene Diaz on [email or web address] @ at  by a video enabled telemedicine application and verified that I am speaking with the correct person using two identifiers.  Location: Patient: Eugene Diaz [id number] Provider: Home Residence   I discussed the limitations of evaluation and management by telemedicine and the availability of in person appointments. The patient expressed understanding and agreed to proceed.  History of Present Illness:            Date of Admission:  05/20/2022          Reason for Consult: Osteomyelitis involving multiple bones in the right foot    Referring Provider: Irwin Brakeman, MD   Assessment:  Skin myelitis involving multiple bones in the right foot with evidence of osteomyelitis in the third fourth and fifth remaining metatarsal shafts as well as some mild changes in the distal aspect of the remaining first and second metatarsal shafts Alcoholic neuropathy Type 2 diabetes mellitus  Plan:  I think ultimately he will need more proximal surgery to affect cure but in the interim I am willing to give him empiric antibiotics and I would choose IV ertapenem once daily along with doxycycline twice daily orally x6 weeks Ensure ESR, CRP checked   Diagnosis: Osteomyelitis of right foot  Culture Result: None  No Known Allergies  OPAT Orders Discharge antibiotics to be given via PICC line Discharge antibiotics: Ertapenem 1 g IV daily along with doxycycline 100 mg twice daily  Duration: 6 weeks End Date: 06/30/2022  [facility] Care Per Protocol:  Home health RN for IV administration and teaching; PICC line care and labs.    Labs weekly while on IV antibiotics: _x_ CBC with differential  _x_ CMP _x_ CRP _x_ ESR   x__ Please pull PIC at completion of IV antibiotics __ Please leave PIC in place until doctor has seen patient or been notified  Fax weekly labs to [phone]  Clinic Follow Up Appt:   OHN Diaz has an appointment on 06/20/2022 at 230PM with [name] at  [facility] which is located in the [facility] at  [address] in [place].  [address], which is located to the left of the elevators.  Phone: [phone]  Fax: [phone]  [email or web address]    Principal Problem:   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) Active Problems:   Hyperlipidemia associated with type 2 diabetes mellitus (HCC)   ETOH abuse   Alcoholic peripheral neuropathy (HCC)   Diabetic neuropathy (HCC)   GERD (gastroesophageal reflux disease)   Anemia, chronic disease   Type 2 diabetes mellitus (HCC)   HTN (hypertension)   Wound infection   LUE weakness   Scheduled Meds:  (feeding supplement) PROSource Plus  30 mL Oral BID BM   chlordiazePOXIDE  5 mg Oral TID   enoxaparin (LOVENOX) injection  40 mg Subcutaneous Q24H   feeding supplement  237 mL Oral T03T   folic acid  1 mg Oral Daily   insulin aspart  0-15 Units Subcutaneous TID WC   insulin aspart  0-5 Units Subcutaneous QHS   metroNIDAZOLE  500 mg Oral Q12H   morphine  30 mg Oral Q12H   multivitamin with minerals  1 tablet Oral Daily   nicotine  21 mg Transdermal Daily   nutrition supplement (JUVEN)  1 packet Oral BID BM   thiamine  100 mg Oral Daily   Continuous  Infusions:  ceFEPime (MAXIPIME) IV 2 g (05/23/22 0953)   lactated ringers 65 mL/hr at 05/23/22 0157   vancomycin 1,250 mg (05/23/22 0743)   PRN Meds:.acetaminophen, LORazepam **OR** LORazepam, oxycodone  HPI: Eugene Diaz is a 56 y.o. male alcoholic neuropathy diabetes mellitus and osteomyelitis who is well-known to me from my having seen him as an inpatient and followed him in our clinic.  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  He had additional further debridement as well with bone biopsy of the first metatarsal and wound closure.  He grew E. Coli group B streptococcus as well as beta-lactamase producing Prevotella.  He was narrowed to intravenous cefazolin with oral metronidazole and discharged home with this with his sister helping him with antibiotics.  We switched him over the IV therapy to Augmentin and he was followed by [name] by podiatry.  He had some significant problems with depression and passive suicidal ideation and related to the loss of his wife many years ago.  He improved significantly emotionally and actually had reduced his alcohol intake drastically.  He is not completely clean but his intake is dramatically less than in the past when he drank on a daily basis.  Followed him closely and saw him last in November 2022.  He came off his antibiotics and he came back and was seen by my partner [name] and seem to have no evidence of active infection at that point in time.  Unfortunately interim he developed worsening gangrenous changes involving the third toe and underwent transmetatarsal amputation with [name] on December 04, 2021.  Apparently approxi a month ago his foot got caught on a nail that was sticking out of his porch.  He developed intermittent redness and swelling that worsened recently and was convinced by family come to the emergency department with worsening flexion of his feet.  His foot was warm and swollen and erythematous.  MRI was performed at [facility] which showed a new soft tissue ulcer in the medial forefoot distal to the coronal and medial to the first metatarsal amputation stump.  There is edema and swelling of the ankle and dorsal midfoot and there is marrow edema within the third and fourth and remaining fifth metatarsal shafts as well as  some mild edema in the distal aspect the remaining first and second metatarsal shafts all concerning for osteomyelitis.  The patient is being was seen by surgery do not see indication for surgery at this point in time.  I am nonetheless concerned that he will not build to cure infection of these bones without more proximal amputation.  In the interim certainly I am wiling to give him IV antibiotics to buy him more time.  We do not have any cultures to go off besides his historical ones.  I think an empiric regimen of once daily IV ertapenem 1 g along with twice daily oral doxycycline to be a reasonable regimen.  He has taken IV antibiotics at home before when his sister was helping him and his alcohol consumption has been reduced dramatically.    I spent 84 minutes with the patient including than 50% of the time in face to face counseling of the patient video feed regarding his bone infection  his prior treatment regimen his alcohol consumption personally reviewing prior the foot along with review of medical records in preparation for the visit and during the visit and in coordination of his care.      Review of Systems: Review of Systems  Constitutional:  Positive for weight loss. Negative for chills, fever and malaise/fatigue.  HENT:  Negative for congestion and sore throat.   Eyes:  Negative for blurred vision and photophobia.  Respiratory:  Negative for cough, shortness of breath and wheezing.   Cardiovascular:  Negative for chest pain, palpitations and leg swelling.  Gastrointestinal:  Negative for abdominal pain, blood in stool, constipation, diarrhea, heartburn, melena, nausea and vomiting.  Genitourinary:  Negative for dysuria, flank pain and hematuria.  Musculoskeletal:  Negative for back pain, falls, joint pain and myalgias.  Skin:  Negative for itching and rash.  Neurological:  Positive for weakness. Negative for dizziness, focal weakness, loss of consciousness and  headaches.  Endo/Heme/Allergies:  Does not bruise/bleed easily.  Psychiatric/Behavioral:  Negative for depression and suicidal ideas. The patient does not have insomnia.     Past Medical History:  Diagnosis Date   Alcohol abuse    Alcoholic peripheral neuropathy ([place])    Anxiety    Arthritis    Asthma    followed by pcp   B12 deficiency    Chronic multifocal osteomyelitis of right foot ([place]) 05/23/2022   Depression    Diabetic neuropathy ([place])    Edema of both lower extremities    Essential tremor    neurologist-- dr patel--- due to alcohol abuse   GERD (gastroesophageal reflux disease)    History of cellulitis 2020   left lower leg;   recurrent 03-25-2020   History of esophageal stricture    s/p  dilatation 02-13-2018   History of osteomyelitis 2020   left great toe     Hypercholesterolemia    Hypertension    followed by pcp  (nuclear stress test 03-11-2014 low risk w/ no ischemia, ef 65%   Normocytic anemia    followed by pcp   (03-18-2020 had transfusion's 02/ 2021)   Osteomyelitis of great toe of right foot ([place]) 08/18/2021   Peripheral vascular disease ([place])    Severe depression ([place]) 06/09/2021   Status post incision and drainage followed by Dr [name] and pcp   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   (03-17-2020  pt states most of incision is healed with exception an area that is still draining,  daily dressing change),  foot is red but not warm to the touch and has swelling but has improved)   Suicidal ideation 06/09/2021   Thrombocytopenia ([place])    chronic   Type 2 diabetes mellitus ([place])    followed by pcp---    (03-18-2020  pt stated does not check blood sugar)    Social History   Tobacco Use   Smoking status: Former    Packs/day: 2.50    Years: 25.00    Total pack years: 62.50    Types: Cigarettes    Quit date: 08/05/1984    Years since quitting: 37.8   Smokeless tobacco: Never  Vaping Use   Vaping Use: Never used  Substance Use Topics   Alcohol  use: Not Currently    Alcohol/week: 50.0 standard drinks of alcohol    Types: 50 Standard drinks or equivalent per week    Comment: Drinks 6 beers, liquor 4-6oz (both daily) x 35 years   Drug use: Not Currently  Types: Marijuana    Family History  Problem Relation Age of Onset   Heart attack Mother        Living, 70   Cancer Father        Deceased   Healthy Brother    Healthy Sister    Colon cancer Neg Hx    Colon polyps Neg Hx    No Known Allergies  OBJECTIVE: Blood pressure (!) 152/82, pulse 65, temperature 97.9 F (36.6 C), temperature source Oral, resp. rate 18, height 6' 2"  (1.88 m), weight 119.3 kg, SpO2 93 %.  Physical Exam Constitutional:      Appearance: He is well-developed.  HENT:     Head: Normocephalic and atraumatic.  Eyes:     Conjunctiva/sclera: Conjunctivae normal.  Cardiovascular:     Rate and Rhythm: Normal rate and regular rhythm.  Pulmonary:     Effort: Pulmonary effort is normal. No respiratory distress.     Breath sounds: No wheezing.  Abdominal:     General: There is no distension.  Musculoskeletal:     Cervical back: Normal range of motion and neck supple.  Skin:    General: Skin is warm and dry.     Coloration: Skin is not pale.     Findings: No erythema or rash.  Neurological:     General: No focal deficit present.     Mental Status: He is alert and oriented to person, place, and time.  Psychiatric:        Mood and Affect: Mood normal.        Behavior: Behavior normal.        Thought Content: Thought content normal.        Judgment: Judgment normal.    Right foot bandaged   Lab Results Lab Results  Component Value Date   WBC 5.3 05/22/2022   HGB 13.2 05/22/2022   HCT 39.8 05/22/2022   MCV 95.7 05/22/2022   PLT 144 (L) 05/22/2022    Lab Results  Component Value Date   CREATININE 0.62 05/23/2022   BUN 11 05/23/2022   NA 139 05/23/2022   K 3.9 05/23/2022   CL 109 05/23/2022   CO2 27 05/23/2022    Lab Results   Component Value Date   ALT 12 05/20/2022   AST 24 05/20/2022   ALKPHOS 78 05/20/2022   BILITOT 0.6 05/20/2022     Microbiology: Recent Results (from the past 240 hour(s))  Blood Culture (routine x 2)     Status: None (Preliminary result)   Collection Time: 05/20/22  3:56 PM   Specimen: Right Antecubital; Blood  Result Value Ref Range Status   Specimen Description   Final    RIGHT ANTECUBITAL BOTTLES DRAWN AEROBIC AND ANAEROBIC   Special Requests   Final    Blood Culture results may not be optimal due to an excessive volume of blood received in culture bottles   Culture   Final    NO GROWTH 3 DAYS Performed at [facility], [address] [place], [place] [address]    Report Status PENDING  Incomplete  Blood Culture (routine x 2)     Status: None (Preliminary result)   Collection Time: 05/20/22  3:56 PM   Specimen: BLOOD LEFT FOREARM  Result Value Ref Range Status   Specimen Description   Final    BLOOD LEFT FOREARM BOTTLES DRAWN AEROBIC AND ANAEROBIC   Special Requests Blood Culture adequate volume  Final   Culture   Final    NO  GROWTH 3 DAYS Performed at [facility], [address] [place], [place] [address]    Report Status PENDING  Incomplete    Alcide Evener, [place] [facility] [place] [facility] [phone] pager  05/23/2022, 2:06 PM

## 2022-05-23 NOTE — Progress Notes (Signed)
PHARMACY ANTIBIOTIC CONSULT NOTE   Eugene Diaz a 56 y.o. male admitted on 7/21 with a wound infection .  Pharmacy has been consulted for vancomycin/cefepime dosing. MRI concerning for osteomyelitis involving these regions of the metatarsals, again greatest within the third through fifth metatarsal shafts. Afebrile. ID to consult today. Vancomycin trough level today.  Estimated Creatinine Clearance: 139.8 mL/min (by C-G formula based on SCr of 0.62 mg/dL).  Plan: Continue Cefepime 2gm IV q8h Continue Vancomycin  1,250 mg IV Q12H  Monitor renal function, clinical status, de-escalation, C/S, levels as indicated   Allergies:  No Known Allergies  Filed Weights   05/20/22 1508 05/20/22 2020  Weight: 120.2 kg (265 lb) 119.3 kg (263 lb 0.1 oz)       Latest Ref Rng & Units 05/22/2022    6:30 AM 05/21/2022    5:37 AM 05/20/2022    3:56 PM  CBC  WBC 4.0 - 10.5 K/uL 5.3  5.9  7.8   Hemoglobin 13.0 - 17.0 g/dL 13.2  13.8  13.8   Hematocrit 39.0 - 52.0 % 39.8  41.5  41.3   Platelets 150 - 400 K/uL 144  141  161     Antibiotics Given (last 72 hours)     Date/Time Action Medication Dose Rate   05/20/22 1618 New Bag/Given   cefTRIAXone (ROCEPHIN) 2 g in sodium chloride 0.9 % 100 mL IVPB 2 g 200 mL/hr   05/20/22 1850 New Bag/Given   vancomycin (VANCOREADY) IVPB 1250 mg/250 mL 1,250 mg 166.7 mL/hr   05/20/22 1851 New Bag/Given   ceFEPIme (MAXIPIME) 2 g in sodium chloride 0.9 % 100 mL IVPB 2 g 200 mL/hr   05/20/22 2209 Given   metroNIDAZOLE (FLAGYL) tablet 500 mg 500 mg    05/20/22 2209 New Bag/Given   vancomycin (VANCOREADY) IVPB 1250 mg/250 mL 1,250 mg 166.7 mL/hr   05/21/22 0215 New Bag/Given   ceFEPIme (MAXIPIME) 2 g in sodium chloride 0.9 % 100 mL IVPB 2 g 200 mL/hr   05/21/22 0843 Given   metroNIDAZOLE (FLAGYL) tablet 500 mg 500 mg    05/21/22 0855 New Bag/Given   vancomycin (VANCOREADY) IVPB 1250 mg/250 mL 1,250 mg 166.7 mL/hr   05/21/22 1232 New Bag/Given   ceFEPIme  (MAXIPIME) 2 g in sodium chloride 0.9 % 100 mL IVPB 2 g 200 mL/hr   05/21/22 1745 New Bag/Given   ceFEPIme (MAXIPIME) 2 g in sodium chloride 0.9 % 100 mL IVPB 2 g 200 mL/hr   05/21/22 2040 New Bag/Given   vancomycin (VANCOREADY) IVPB 1250 mg/250 mL 1,250 mg 166.7 mL/hr   05/21/22 2049 Given   metroNIDAZOLE (FLAGYL) tablet 500 mg 500 mg    05/22/22 0148 New Bag/Given   ceFEPIme (MAXIPIME) 2 g in sodium chloride 0.9 % 100 mL IVPB 2 g 200 mL/hr   05/22/22 0818 New Bag/Given   vancomycin (VANCOREADY) IVPB 1250 mg/250 mL 1,250 mg 166.7 mL/hr   05/22/22 0905 Given   metroNIDAZOLE (FLAGYL) tablet 500 mg 500 mg    05/22/22 1031 New Bag/Given   ceFEPIme (MAXIPIME) 2 g in sodium chloride 0.9 % 100 mL IVPB 2 g 200 mL/hr   05/22/22 1813 New Bag/Given   ceFEPIme (MAXIPIME) 2 g in sodium chloride 0.9 % 100 mL IVPB 2 g 200 mL/hr   05/22/22 2106 New Bag/Given  [other medication infusing]   vancomycin (VANCOREADY) IVPB 1250 mg/250 mL 1,250 mg 166.7 mL/hr   05/22/22 2230 Given   metroNIDAZOLE (FLAGYL) tablet 500 mg 500 mg  05/23/22 0110 New Bag/Given   ceFEPIme (MAXIPIME) 2 g in sodium chloride 0.9 % 100 mL IVPB 2 g 200 mL/hr   05/23/22 0743 New Bag/Given   vancomycin (VANCOREADY) IVPB 1250 mg/250 mL 1,250 mg 166.7 mL/hr       Antimicrobials this admission: Vancomycin 7/21>> Cefepime 7/21>> Flagyl PO 7/21>>  Microbiology results: 7/21 [place]: ngtd  Thank you for allowing pharmacy to be a part of this patient's care.  Isac Sarna, BS Pharm D, BCPS Clinical Pharmacist 05/23/2022 8:52 AM

## 2022-05-23 NOTE — Progress Notes (Signed)
PHARMACY CONSULT NOTE FOR:  OUTPATIENT  PARENTERAL ANTIBIOTIC THERAPY (OPAT)  Indication: Osteomyelitis Regimen: Ertapenem 1gm IV q24h End date: 06/30/2022  IV antibiotic discharge orders are pended. To discharging provider:  please sign these orders via discharge navigator,  Select New Orders & click on the button choice - Manage This Unsigned Work.     Thank you for allowing pharmacy to be a part of this patient's care.  Isac Sarna, BS Pharm D, BCPS Clinical Pharmacist 05/23/2022, 3:11 PM

## 2022-05-23 NOTE — Progress Notes (Signed)
Wound care provided to right foot per order. Patient tolerated well, no complaints of pain.

## 2022-05-23 NOTE — Plan of Care (Signed)

## 2022-05-23 NOTE — TOC Initial Note (Signed)
Transition of Care [facility]) - Initial/Assessment Note    Patient Details  Name: Eugene Diaz MRN: [id number] Date of Birth: Feb 22, 1966  Transition of Care [facility]) CM/SW Contact:    Salome Arnt, [place] Phone Number: 05/23/2022, 12:57 PM  Clinical Narrative:  Pt admitted with infected diabetic ulcer of right foot. Assessment completed due to high risk readmission score. Pt reports he has several friends living with him. He is independent with ADLs and has transportation to appointments. IV antibiotics recommended at d/c. LCSW discussed with pt who reports he was on IV antibiotics at home last year. He states he will have someone willing to assist him at home. Discussed recommendation for HHPT and pt agreeable. Per Pam with [facility] can provide RN for IV antibiotics. LCSW contacted [place] with [facility] who accepts pt for HHPT. He is aware order is for Education officer, museum as well. Per MD, pt has had wounds for long period of time and should be able to manage wound care.  LCSW received consult for medication assistance and substance use. Pt has UHC Medicare and Medicaid and does not qualify for further assistance. He reports he was drinking 12-18 beers daily and he has cut back to "almost nothing." Pt does not feel that alcohol use is a problem for him at this time. TOC will continue to follow to assist with d/c planning.                  Expected Discharge Plan: [place] Barriers to Discharge: Continued Medical Work up   Patient Goals and CMS Choice Patient states their goals for this hospitalization and ongoing recovery are:: return home   Choice offered to / list presented to : Patient  Expected Discharge Plan and Services Expected Discharge Plan: [place] In-house Referral: Clinical Social Work   Post Acute Care Choice: [place] arrangements for the past 2 months: [place] Arranged: RN,  PT [place] Agency: [place], [place] Date [place]: 05/23/22 Time [place]: [place] Representative spoke with at [place] [facility]mings) and Jeannene Patella [place])  Prior Living Arrangements/Services Living arrangements for the past 2 months: [place] with:: Friends Patient language and need for interpreter reviewed:: Yes Do you feel safe going back to the place where you live?: Yes          Current home services: DME (wheelchair, cane, crutches, walker) Criminal Activity/Legal Involvement Pertinent to Current Situation/Hospitalization: No - Comment as needed  Activities of Daily Living Home Assistive Devices/Equipment: Cane (specify quad or straight), Prosthesis, Wheelchair, Environmental consultant (specify type) ADL Screening (condition at time of admission) Patient's cognitive ability adequate to safely complete daily activities?: Yes Is the patient deaf or have difficulty hearing?: No Does the patient have difficulty seeing, even when wearing glasses/contacts?: Yes Does the patient have difficulty concentrating, remembering, or making decisions?: No Patient able to express need for assistance with ADLs?: Yes Does the patient have difficulty dressing or bathing?: No Independently performs ADLs?: Yes (appropriate for developmental age) Does the patient have difficulty walking or climbing stairs?: Yes Weakness of Legs: Both Weakness of Arms/Hands: None  Permission Sought/Granted                  Emotional Assessment     Affect (typically  observed): Appropriate Orientation: : Oriented to Self, Oriented to Place, Oriented to  Time, Oriented to Situation Alcohol / Substance Use: Alcohol Use Psych Involvement: No (comment)  Admission diagnosis:  Wound infection [T14.8XXA, L08.9] Patient Active Problem List   Diagnosis Date Noted   LUE weakness 05/22/2022   Wound infection 05/20/2022   Alcohol use disorder 03/03/2022   Chest pain- w atypical and  typical features 03/02/2022   Tobacco abuse 03/02/2022   HTN (hypertension) 03/02/2022   Gangrene right third toe associated with type 2 diabetes mellitus ([place]), likely osteomyelitis 12/03/2021   Type 2 diabetes mellitus ([place]) 12/03/2021   Osteomyelitis of right foot ([place])    Gangrene ([place])    Osteomyelitis of great toe of right foot ([place]) 08/18/2021   Severe depression ([place]) 06/09/2021   Suicidal ideation 06/09/2021   Diabetic wet gangrene of the foot ([place])    Unilateral complete BKA, left, initial encounter ([place]) 06/30/2020   Acute blood loss anemia 01/04/2020   Asthma    Depression with anxiety    Hyperglycemia 12/17/2019   Diabetic foot ulcer (HCC)    Anemia, chronic disease 11/10/2019   Marijuana use 11/10/2019   Wound dehiscence 08/31/2019   Hyponatremia 08/14/2019   Normocytic anemia 08/14/2019   B12 deficiency 08/14/2019   Subacute osteomyelitis, left ankle and foot (HCC)    GERD (gastroesophageal reflux disease) 12/19/2018   Hepatic steatosis 06/18/2018   Gastritis, erosive    Dysphagia, idiopathic 12/27/2017   Rectal bleeding 12/27/2017   Intention tremor 06/03/2016   Left knee pain 06/13/4817   Alcoholic peripheral neuropathy ([place]) 10/08/2014   Diabetic neuropathy ([place]) 10/08/2014   Chest pain, rule out acute myocardial infarction 03/10/2014   Thrombocytopenia ([place]) 03/10/2014   ETOH abuse 03/10/2014   Type 2 diabetes mellitus with peripheral neuropathy ([place])    Hyperlipidemia associated with type 2 diabetes mellitus ([place])    PCP:  Redmond School, MD Pharmacy:   [facility], [place] [address] [place] [id number] Phone: [phone] Fax: [phone]     Social Determinants of Health (SDOH) Interventions    Readmission Risk Interventions    05/23/2022   12:50 PM 06/25/2020    2:52 PM  Readmission Risk Prevention Plan  Transportation Screening Complete Complete  HRI or Home Care Consult Complete    Social Work Consult for [place] Planning/Counseling Complete   Palliative Care Screening Not Applicable   Medication Review Press photographer) Complete Complete  PCP or Specialist appointment within 3-5 days of discharge  Complete  HRI or Home Care Consult  Complete  SW Recovery Care/Counseling Consult  Complete  Palliative Care Screening  Complete  Skilled Nursing Facility  Complete

## 2022-05-23 NOTE — Progress Notes (Signed)
OT Cancellation Note  Patient Details Name: DEONDREA MARKOS MRN: [id number] DOB: 03-23-1966   Cancelled Treatment:    Reason Eval/Treat Not Completed: OT screened, no needs identified, will sign off. Pt screened for OT needs this am. Pt reporting he has been through this before and does not feel that he needs any OT assistance. Pt verbalizing appropriate techniques for ADLs at home using his DME. OT will sign off at this time.     Guadelupe Sabin, OTR/L  [phone] 05/23/2022, 9:55 AM

## 2022-05-23 NOTE — Progress Notes (Signed)
PROGRESS NOTE   ELIM ECONOMOU  QVZ:[id number] DOB: 06-Aug-1966 DOA: 05/20/2022 PCP: Redmond School, MD   Chief Complaint  Patient presents with   Foot Injury   Level of care: Med-Surg  Brief Admission History:  56 y.o. 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 -Patient presents today secondary to left foot wound, he is status post right TMA by [name] May of this year, sister-in-law provides most of the history, he is noncompliant with his medications, decreased his alcohol consumption significantly, no smoking, noncompliant with medication,. -Patient reports 1 month ago he got his foot caught on a nail that was sticking out of his porch, does not recall pain given his severe neuropathy, reports intermittent redness, and swelling, but this has worsened recently, family had to convince the patient to come to ED given worsening flexion of his feet, he denies nausea, vomiting, fever, chills or pain. -In ED he was afebrile, no leukocytosis, lactic acid within normal limit, but his foot was noted to be significantly warm swollen and erythematous, with wound in the medial side of his foot, [facility] hospitalist consulted to admit     Assessment and Plan: Osteomyelitis Right stump /Cellulitis Right stump/ Infected diabetic ulcer of the right foot Osteomyelitis of transmetarsal bones right foot  -Blood cultures were sent - no growth to date x 2 days -X-ray with no evidence of osteomyelitis, MRI is suggesting osteomyelitis -Started on broad-spectrum antibiotics, given he had a nail injury we will proceed with cefepime to cover for Pseudomonas infection, giving recurrent hospitalization in the past we will keep on vancomycin as well, continue with Flagyl. -Pt discussed with surgeon, pt would like to try long course of IV  antibiotics with PICC line before would consider surgery.  -Will ask ID service to see on 7/24 for recommendations.:  OPAT orders placed.  Invanz and oral doxycycline  -Hopefully PICC line can be placed 7/25   Type 2 diabetes mellitus with peripheral neuropathy ([place]) -He is on metformin, will hold and will keep an insulin sliding scale. - A1c pending  CBG (last 3)  Recent Labs    05/23/22 0707 05/23/22 1109 05/23/22 1551  GLUCAP 113* 221* 134*    HTN (hypertension) -Compliant with his home medications, not taking any, his blood pressures currently acceptable, so we will start back on home meds when blood pressure started to increase   Hyperlipidemia associated with type 2 diabetes mellitus Check fasting lipids in AM   Tobacco abuse He was counseled and will offer nicotine patch   Alcohol use disorder Currently drinking half fifth of liquor over 1 week which is significant action from his previous . -Continue with thiamine and folic acid . -We will monitor closely for withdrawals, will keep on CIWA protocol -librium 5 mg TID ordered    Chronic pain syndrome -On significant dose of narcotics, this was reviewed via PMP portal, and was resumed   Body mass index is 34.02 kg/m.     DVT Prophylaxis  Lovenox - SCDs  Code Status: Full  Family Communication:  Disposition: Status is: Inpatient Remains inpatient appropriate because: IV antibiotics required    Consultants:  Surgery ID service  Procedures:   Antimicrobials:    Subjective: Pt reported that he would like PICC line and home IV antibiotics.     Objective: Vitals:   05/22/22 1436 05/22/22 2018 05/23/22 3295  05/23/22 1449  BP: 135/68 (!) 154/77 (!) 152/82 (!) 133/92  Pulse: 66 66 65 66  Resp: '20 18 18 20  '$ Temp: 98.2 F (36.8 C) 98.2 F (36.8 C) 97.9 F (36.6 C) 98.2 F (36.8 C)  TempSrc: Oral Oral Oral Oral  SpO2: 97% 98% 93% 98%  Weight:      Height:        Intake/Output Summary (Last 24 hours) at  05/23/2022 1636 Last data filed at 05/23/2022 1500 Gross per 24 hour  Intake 2774.1 ml  Output 3600 ml  Net -825.9 ml   Filed Weights   05/20/22 1508 05/20/22 2020  Weight: 120.2 kg 119.3 kg   Examination:  General exam: Appears calm and comfortable  Respiratory system: Clear to auscultation. Respiratory effort normal. Cardiovascular system: normal S1 & S2 heard. No JVD, murmurs, rubs, gallops or clicks. No pedal edema. Gastrointestinal system: Abdomen is nondistended, soft and nontender. No organomegaly or masses felt. Normal bowel sounds heard. Central nervous system: Alert and oriented. No focal neurological deficits. Extremities: cellulitis right foot stump erythema slightly improved. Skin: No rashes, lesions or ulcers. Psychiatry: Judgement and insight appear normal. Mood & affect appropriate.   Data Reviewed: I have personally reviewed following labs and imaging studies  CBC: Recent Labs  Lab 05/20/22 1556 05/21/22 0537 05/22/22 0630  WBC 7.8 5.9 5.3  NEUTROABS 4.2  --   --   HGB 13.8 13.8 13.2  HCT 41.3 41.5 39.8  MCV 95.2 96.1 95.7  PLT 161 141* 144*    Basic Metabolic Panel: Recent Labs  Lab 05/20/22 1556 05/21/22 0537 05/22/22 0630 05/23/22 0605  NA 142 141 137 139  K 3.7 3.9 3.6 3.9  CL 111 113* 108 109  CO2 '25 23 25 27  '$ GLUCOSE 105* 99 105* 115*  BUN 26* '16 8 11  '$ CREATININE 0.96 0.63 0.62 0.62  CALCIUM 8.7* 8.3* 8.8* 8.9  MG  --   --  1.6*  --     CBG: Recent Labs  Lab 05/22/22 1713 05/22/22 2103 05/23/22 0707 05/23/22 1109 05/23/22 1551  GLUCAP 138* 159* 113* 221* 134*    Recent Results (from the past 240 hour(s))  Blood Culture (routine x 2)     Status: None (Preliminary result)   Collection Time: 05/20/22  3:56 PM   Specimen: Right Antecubital; Blood  Result Value Ref Range Status   Specimen Description   Final    RIGHT ANTECUBITAL BOTTLES DRAWN AEROBIC AND ANAEROBIC   Special Requests   Final    Blood Culture results may not be  optimal due to an excessive volume of blood received in culture bottles   Culture   Final    NO GROWTH 3 DAYS Performed at [facility], [address] [place], [place] [address]    Report Status PENDING  Incomplete  Blood Culture (routine x 2)     Status: None (Preliminary result)   Collection Time: 05/20/22  3:56 PM   Specimen: BLOOD LEFT FOREARM  Result Value Ref Range Status   Specimen Description   Final    BLOOD LEFT FOREARM BOTTLES DRAWN AEROBIC AND ANAEROBIC   Special Requests Blood Culture adequate volume  Final   Culture   Final    NO GROWTH 3 DAYS Performed at [facility], [address] [place], [place] [address]    Report Status PENDING  Incomplete     Radiology Studies: [place] EKG SITE RITE  Result Date: 05/23/2022 If Site Rite image not attached,  placement could not be confirmed due to current cardiac rhythm.  MR BRAIN WO CONTRAST  Result Date: 05/23/2022 CLINICAL DATA:  Neuro deficit, acute, stroke suspected EXAM: MRI HEAD WITHOUT CONTRAST TECHNIQUE: Multiplanar, multiecho pulse sequences of the brain and surrounding structures were obtained without intravenous contrast. COMPARISON:  CT head 06/21/2020. FINDINGS: Brain: No acute infarction, hemorrhage, hydrocephalus, extra-axial collection or mass lesion. Moderate scattered T2/FLAIR hyperintensities the white matter, nonspecific but compatible with chronic microvascular ischemic disease. Vascular: Major arterial flow voids are maintained at the skull base. Skull and upper cervical spine: Choose 1 Sinuses/Orbits: Mild paranasal sinus mucosal thickening. No acute orbital findings. Other: No mastoid effusions. IMPRESSION: 1. No evidence of acute intracranial abnormality. 2. Moderate presumed chronic microvascular ischemic disease. Electronically Signed   By: Margaretha Sheffield M.D.   On: 05/23/2022 09:59   US ARTERIAL [name] (SCREENING LOWER EXTREMITY)  Result Date: 05/23/2022 CLINICAL DATA:  Peripheral vascular disease, history  of prior left lower extremity below the knee amputation EXAM: NONINVASIVE PHYSIOLOGIC VASCULAR STUDY OF BILATERAL LOWER EXTREMITIES TECHNIQUE: Evaluation of both lower extremities were performed at rest, including calculation of ankle-brachial indices with single level Doppler, pressure and pulse volume recording. COMPARISON:  None Available. FINDINGS: Right [name]:  1.2 Left [name]:  Left below the knee amputation Right Lower Extremity:  Normal arterial waveforms at the ankle. Left Lower Extremity:  Left below the knee amputation 1.0-1.4 Normal IMPRESSION: Normal resting right ankle-brachial index. Electronically Signed   By: Jacqulynn Cadet M.D.   On: 05/23/2022 07:37    Scheduled Meds:  (feeding supplement) PROSource Plus  30 mL Oral BID BM   chlordiazePOXIDE  5 mg Oral TID   doxycycline  100 mg Oral Q12H   enoxaparin (LOVENOX) injection  40 mg Subcutaneous Q24H   feeding supplement  237 mL Oral I45Y   folic acid  1 mg Oral Daily   insulin aspart  0-15 Units Subcutaneous TID WC   insulin aspart  0-5 Units Subcutaneous QHS   morphine  30 mg Oral Q12H   multivitamin with minerals  1 tablet Oral Daily   nicotine  21 mg Transdermal Daily   nutrition supplement (JUVEN)  1 packet Oral BID BM   thiamine  100 mg Oral Daily   Continuous Infusions:  ertapenem     lactated ringers 65 mL/hr at 05/23/22 0157    LOS: 3 days   Time spent: 35 mins  Dhaval Woo Wynetta Emery, MD How to contact the [facility] Attending or Consulting provider [place] or covering provider during after hours [place], for this patient?  Check the care team in [facility] and look for a) attending/consulting TRH provider listed and b) the [facility] team listed Log into [email or web address] and use [place]'s universal password to access. If you do not have the password, please contact the hospital operator. Locate the [facility] provider you are looking for under [facility] and page to a number that you can be directly reached. If you still have difficulty  reaching the provider, please page the [facility] (Director on Call) for the Hospitalists listed on amion for assistance.  05/23/2022, 4:36 PM

## 2022-05-23 NOTE — Inpatient Diabetes Management (Signed)
Inpatient Diabetes Program Recommendations  AACE/ADA: New Consensus Statement on Inpatient Glycemic Control  Target Ranges:  Prepandial:   less than 140 mg/dL      Peak postprandial:   less than 180 mg/dL (1-2 hours)      Critically ill patients:  140 - 180 mg/dL     Latest Reference Range & Units 05/23/22 07:07 05/23/22 11:09  Glucose-Capillary 70 - 99 mg/dL 113 (H) 221 (H)    Latest Reference Range & Units 03/02/22 09:57 05/20/22 15:56  Hemoglobin A1C 4.8 - 5.6 % 5.7 (H) 5.1   Review of Glycemic Control  Diabetes history: DM2 Outpatient Diabetes medications: Metformin 500 mg BID Current orders for Inpatient glycemic control: Novolog 0-15 units TID with meals, Novolog 0-5 units QHS  Inpatient Diabetes Program Recommendations:    Diet: IF appropriate for patient, please consider discontinuing Regular diet and ordering Carb Modified diet.  NOTE: Noted consult for diabetes coordinator per lower extremity wound order set. Chart reviewed. Agree with current orders for inpatient DM control. Will sign off consult. Please reconsult if needed.  Thanks, Barnie Alderman, RN, MSN, [place] Diabetes Coordinator Inpatient Diabetes Program [phone] (Team Pager from 8am to [place])

## 2022-05-23 NOTE — Progress Notes (Signed)
Discussed with Dr. Wynetta Emery.  Patient to get PICC line today.  We will follow-up with his podiatrist.  We will sign off.  Please call me if I can be of further assistance.

## 2022-05-23 NOTE — Progress Notes (Signed)
Spoke with primary RN about PICC placement and made aware PICC will be placed on 05/24/22.

## 2022-05-24 DIAGNOSIS — F1010 Alcohol abuse, uncomplicated: Secondary | ICD-10-CM

## 2022-05-24 DIAGNOSIS — Z794 Long term (current) use of insulin: Secondary | ICD-10-CM

## 2022-05-24 DIAGNOSIS — E0849 Diabetes mellitus due to underlying condition with other diabetic neurological complication: Secondary | ICD-10-CM | POA: Diagnosis not present

## 2022-05-24 DIAGNOSIS — D638 Anemia in other chronic diseases classified elsewhere: Secondary | ICD-10-CM | POA: Diagnosis not present

## 2022-05-24 DIAGNOSIS — E1111 Type 2 diabetes mellitus with ketoacidosis with coma: Secondary | ICD-10-CM

## 2022-05-24 DIAGNOSIS — M86371 Chronic multifocal osteomyelitis, right ankle and foot: Secondary | ICD-10-CM

## 2022-05-24 DIAGNOSIS — R29898 Other symptoms and signs involving the musculoskeletal system: Secondary | ICD-10-CM

## 2022-05-24 DIAGNOSIS — G621 Alcoholic polyneuropathy: Secondary | ICD-10-CM | POA: Diagnosis not present

## 2022-05-24 LAB — LIPID PANEL
Cholesterol: 112 mg/dL (ref 0–200)
HDL: 25 mg/dL — ABNORMAL LOW (ref >40)
LDL Cholesterol: 70 mg/dL (ref 0–99)
Total CHOL/HDL Ratio: 4.5 RATIO
Triglycerides: 84 mg/dL (ref <150)
VLDL: 17 mg/dL (ref 0–40)

## 2022-05-24 LAB — GLUCOSE, CAPILLARY
Glucose-Capillary: 115 mg/dL — ABNORMAL HIGH (ref 70–99)
Glucose-Capillary: 218 mg/dL — ABNORMAL HIGH (ref 70–99)

## 2022-05-24 MED ORDER — ERTAPENEM IV (FOR PTA / DISCHARGE USE ONLY): 1.0000 g | INTRAVENOUS | 0 refills | Status: DC

## 2022-05-24 MED ORDER — SODIUM CHLORIDE 0.9% FLUSH
10.0000 mL | Freq: Two times a day (BID) | INTRAVENOUS | Status: DC
  Administered 2022-05-24: 10 mL

## 2022-05-24 MED ORDER — DOXYCYCLINE HYCLATE 100 MG PO TABS: 100.0000 mg | ORAL_TABLET | Freq: Two times a day (BID) | ORAL | 0 refills | Status: DC

## 2022-05-24 MED ORDER — CHLORHEXIDINE GLUCONATE CLOTH 2 % EX PADS
6.0000 | MEDICATED_PAD | Freq: Every day | CUTANEOUS | Status: DC
  Administered 2022-05-24: 6 via TOPICAL

## 2022-05-24 MED ORDER — SODIUM CHLORIDE 0.9% FLUSH: 10.0000 mL | INTRAVENOUS | Status: DC | PRN

## 2022-05-24 NOTE — Discharge Instructions (Addendum)
PLEASE FOLLOW UP WITH PODIATRIST IN 2 WEEKS PLEASE FOLLOW UP WITH DR. VAN DAM ON 06/20/22 AS SCHEDULED    IMPORTANT INFORMATION: PAY CLOSE ATTENTION   PHYSICIAN DISCHARGE INSTRUCTIONS  Follow with Primary care provider  Eugene School, Eugene Diaz  and other consultants as instructed by your Hospitalist Physician  [place] IF SYMPTOMS COME BACK, WORSEN OR NEW PROBLEM DEVELOPS   Please note: You were cared for by a hospitalist during your hospital stay. Every effort will be made to forward records to your primary care provider.  You can request that your primary care provider send for your hospital records if they have not received them.  Once you are discharged, your primary care physician will handle any further medical issues. Please note that NO REFILLS for any discharge medications will be authorized once you are discharged, as it is imperative that you return to your primary care physician (or establish a relationship with a primary care physician if you do not have one) for your post hospital discharge needs so that they can reassess your need for medications and monitor your lab values.  Please get a complete blood count and chemistry panel checked by your Primary Eugene Diaz at your next visit, and again as instructed by your Primary Eugene Diaz.  Get Medicines reviewed and adjusted: Please take all your medications with you for your next visit with your Primary Eugene Diaz  Laboratory/radiological data: Please request your Primary Eugene Diaz to go over all hospital tests and procedure/radiological results at the follow up, please ask your primary care provider to get all Hospital records sent to his/her office.  In some cases, they will be blood work, cultures and biopsy results pending at the time of your discharge. Please request that your primary care provider follow up on these results.  If you are diabetic, please bring your blood sugar readings with you to your follow up appointment  with primary care.    Please call and make your follow up appointments as soon as possible.    Also Note the following: If you experience worsening of your admission symptoms, develop shortness of breath, life threatening emergency, suicidal or homicidal thoughts you must seek medical attention immediately by calling 911 or calling your Eugene Diaz immediately  if symptoms less severe.  You must read complete instructions/literature along with all the possible adverse reactions/side effects for all the Medicines you take and that have been prescribed to you. Take any new Medicines after you have completely understood and accpet all the possible adverse reactions/side effects.   Do not drive when taking Pain medications or sleeping medications (Benzodiazepines)  Do not take more than prescribed Pain, Sleep and Anxiety Medications. It is not advisable to combine anxiety,sleep and pain medications without talking with your primary care practitioner  Special Instructions: If you have smoked or chewed Tobacco  in the last 2 yrs please stop smoking, stop any regular Alcohol  and or any Recreational drug use.  Wear Seat belts while driving.  Do not drive if taking any narcotic, mind altering or controlled substances or recreational drugs or alcohol.

## 2022-05-24 NOTE — Progress Notes (Signed)
Peripherally Inserted Central Catheter Placement  The IV Nurse has discussed with the patient and/or persons authorized to consent for the patient, the purpose of this procedure and the potential benefits and risks involved with this procedure.  The benefits include less needle sticks, lab draws from the catheter, and the patient may be discharged home with the catheter. Risks include, but not limited to, infection, bleeding, blood clot (thrombus formation), and puncture of an artery; nerve damage and irregular heartbeat and possibility to perform a PICC exchange if needed/ordered by physician.  Alternatives to this procedure were also discussed.  Bard Power PICC patient education guide, fact sheet on infection prevention and patient information card has been provided to patient /or left at bedside.    PICC Placement Documentation  PICC Single Lumen 05/24/22 Right Brachial 42 cm 0 cm (Active)  Indication for Insertion or Continuance of Line Home intravenous therapies (PICC only) 05/24/22 0900  Exposed Catheter (cm) 0 cm 05/24/22 0900  Site Assessment Clean, Dry, Intact 05/24/22 0900  Line Status Flushed;Saline locked;Blood return noted 05/24/22 0900  Dressing Type Securing device;Transparent 05/24/22 0900  Dressing Status Allergy to antimicrobial disc;Clean, Dry, Intact 05/24/22 0900  Safety Lock Not Applicable 18/84/16 6063  Line Care Connections checked and tightened 05/24/22 0900  Dressing Intervention New dressing 05/24/22 0900  Dressing Change Due 05/31/22 05/24/22 0900       Holley Bouche Saxon 05/24/2022, 9:35 AM

## 2022-05-24 NOTE — Care Management Important Message (Signed)
Important Message  Patient Details  Name: Eugene Diaz MRN: [id number] Date of Birth: 1965/11/03   Medicare Important Message Given:  N/A - LOS <3 / Initial given by admissions     Tommy Medal 05/24/2022, 9:11 AM

## 2022-05-24 NOTE — Discharge Summary (Signed)
Physician Discharge Summary  Eugene Diaz UXN:[id number] DOB: [birth year] DOA: 05/20/2022  PCP: Redmond School, MD Podiatrist: Dr. [name] ID: [name]   Admit date: 05/20/2022 Discharge date: 05/24/2022  Admitted From:  HOME  Disposition: [facility]  Recommendations for Outpatient Follow-up:  Follow up with Dr. [name], podiatrist in 2 weeks Follow up with  [name] on 06/20/22 as scheduled Please obtain BMP/CBC in one week  Home Health:  RN   Discharge Condition: STABLE   CODE STATUS: FULL DIET: carb modified   Brief Hospitalization Summary: Please see all hospital notes, images, labs for full details of the hospitalization. 56 y.o. 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 -Patient presents today secondary to left foot wound, he is status post right TMA by [name] May of this year, sister-in-law provides most of the history, he is noncompliant with his medications, decreased his alcohol consumption significantly, no smoking, noncompliant with medication,. -Patient reports 1 month ago he got his foot caught on a nail that was sticking out of his porch, does not recall pain given his severe neuropathy, reports intermittent redness, and swelling, but this has worsened recently, family had to convince the patient to come to ED given worsening flexion of his feet, he denies nausea, vomiting, fever, chills or pain. -In ED he was afebrile, no leukocytosis, lactic acid within normal limit, but his foot was noted to be significantly warm swollen and erythematous, with wound in the medial side of his foot, [facility] consulted to admit   Hospital course by problem list   Osteomyelitis Right stump /Cellulitis Right stump/ Infected diabetic ulcer of the right foot Osteomyelitis of transmetarsal bones  right foot  -Blood cultures were sent - no growth to date  -X-ray with no evidence of osteomyelitis, MRI is suggesting osteomyelitis -Started on broad-spectrum antibiotics, given he had a nail injury we treated with cefepime to cover for Pseudomonas infection, giving recurrent hospitalization in the past we initially treated with vancomycin, Flagyl. -Pt discussed with surgeon, pt would like to try long course of IV antibiotics with PICC line before would consider surgery.   Pt would like to follow up with podiatrist Dr. [name].  -Will ask ID service to see on 7/24 for recommendations.:  OPAT orders placed.  Invanz and oral doxycycline  -PICC line can be placed 7/25  Home health for IV antibiotics arranged.   Type 2 diabetes mellitus with peripheral neuropathy  -resume home treatments CBG (last 3)  Recent Labs    05/23/22 2210 05/24/22 0717 05/24/22 1115  GLUCAP 143* 115* 218*    HTN (hypertension) -resume amlodipine daily    Hyperlipidemia associated with type 2 diabetes mellitus LDL 70, TC 112, HDL 25, Trig 84   Tobacco abuse He was counseled and will offer nicotine patch   Alcohol use disorder Currently drinking half fifth of liquor over 1 week which is significant action from his previous . -Continue with thiamine and folic acid . -We monitored closely for withdrawals, on CIWA protocol -librium 5 mg TID ordered but no active withdrawal seen in hospital -Pt strongly advised to return to drinking alcohol, he verbalized understanding.    Chronic pain syndrome -On significant dose of narcotics, this was reviewed via PMP portal, and was resumed   Body mass index is 34.02 kg/m.  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   Complete by: As directed    [facility] to provide Cath Flo 100m   Complete by: As directed    Administer for PICC line occlusion and as ordered by physician for other access device issues.   Anaphylaxis Kit: Provided to treat any anaphylactic reaction to the medication being provided to the patient if First Dose or when requested by physician   Complete by: As directed    Epinephrine 1565mml vial / amp: Administer 0.65m72m0.65ml62mubcutaneously once for moderate to severe anaphylaxis, nurse to call physician and pharmacy when reaction occurs and call 911 if needed for immediate care   Diphenhydramine 50mg28mIV vial: Administer 25-50mg 72mM PRN for first dose reaction, rash, itching, mild reaction, nurse to call physician and pharmacy when reaction occurs   Sodium Chloride 0.9% NS 500ml I41mdminister if needed for hypovolemic blood pressure drop or as ordered by physician after call to physician with anaphylactic reaction   Change dressing on IV access line weekly and PRN   Complete by: As directed    Flush IV access with Sodium Chloride 0.9% and Heparin 10 units/ml or 100 units/ml   Complete by: As directed    Home infusion instructions - [facility]   Complete by: As directed    Instructions: Flush IV access with Sodium Chloride 0.9% and Heparin 10units/ml or 100units/ml   Change dressing on IV access line: Weekly and PRN   Instructions Cath Flo 2mg: Ad61mister for PICC Line occlusion and as ordered by physician for other access device   [facility] pharmacist to adjust dose for: Vancomycin, Aminoglycosides and other anti-infective  therapies as requested by physician   Method of administration may be changed at the discretion of home infusion pharmacist based upon assessment of the patient and/or caregiver's ability to self-administer the medication ordered   Complete by: As directed       Allergies as of 05/24/2022   No Known Allergies      Medication List     STOP taking these medications    hydrochlorothiazide 12.5 MG tablet Commonly known as: HYDRODIURIL   irbesartan 150 MG tablet Commonly known as: AVAPRO   lisinopril 40 MG tablet Commonly known as: ZESTRIL       TAKE these medications    acetaminophen 325 MG tablet Commonly known as: TYLENOL Take 2 tablets (650 mg total) by mouth every 6 (six) hours as needed for mild pain.   albuterol 108 (90 Base) MCG/ACT inhaler Commonly known as: VENTOLIN HFA Inhale 1-2 puffs into the lungs every 4 (four) hours as needed for wheezing or shortness of breath.   ALPRAZolam 1 MG tablet Commonly known as: XANAX Take 1 mg by mouth 4 (four) times daily.   amLODipine 10 MG tablet Commonly known as: NORVASC Take 10 mg by mouth daily.   diclofenac Sodium 1 % Gel Commonly known as: VOLTAREN Apply 4 g topically 4 (four) times daily as needed (  Apply to painful area over left upper front chest.).   doxycycline 100 MG tablet Commonly known as: VIBRA-TABS Take 1 tablet (100 mg total) by mouth every 12 (twelve) hours.   ertapenem  IVPB Commonly known as: INVANZ Inject 1 g into the vein daily. Indication:  Osteomyelitis First Dose: No Last Day of Therapy:  06/20/22 Labs - Once weekly:  CBC/D and BMP, Labs - Every other week:  ESR and CRP Method of administration: Mini-Bag Plus / Gravity Method of administration may be changed at the discretion of home infusion pharmacist based upon assessment of the patient and/or caregiver's ability to self-administer the medication ordered.   folic acid 1 MG tablet Commonly known as: FOLVITE Take 1 tablet (1 mg total)  by mouth daily.   metFORMIN 500 MG tablet Commonly known as: GLUCOPHAGE Take 500 mg by mouth 2 (two) times daily.   morphine 30 MG 12 hr tablet Commonly known as: MS CONTIN Take 30 mg by mouth every 12 (twelve) hours.   multivitamin with minerals Tabs tablet Take 1 tablet by mouth daily.   oxycodone 30 MG immediate release tablet Commonly known as: ROXICODONE Take 30 mg by mouth 4 (four) times daily as needed.   thiamine 100 MG tablet Take 1 tablet (100 mg total) by mouth daily.               Discharge Care Instructions  (From admission, onward)           Start     Ordered   05/24/22 0000  Change dressing on IV access line weekly and PRN  (Home infusion instructions - [facility] )        05/24/22 1107            Follow-up Information     [facility] Follow up.   Why: For IV antibiotics/RN.        [facility] Follow up.   Specialty: Home Health Services Why: Will contact you to schedule home health PT visits. Contact information: [place] [address] [phone]         [facility]. Go on 06/20/2022.   Specialty: Infectious Diseases Why: Hospital Follow Up with [name] Contact information: [place], [place] [id number] [place]        [name], DPM. Schedule an appointment as soon as possible for a visit in 2 week(s).   Specialty: Podiatry Why: Hospital Follow Up Contact information: [address] [address] [phone]                No Known Allergies Allergies as of 05/24/2022   No Known Allergies      Medication List     STOP taking these medications    hydrochlorothiazide 12.5 MG tablet Commonly known as: HYDRODIURIL   irbesartan 150 MG tablet Commonly known as: AVAPRO   lisinopril 40 MG tablet Commonly known as: ZESTRIL       TAKE these  medications    acetaminophen 325 MG tablet Commonly known as: TYLENOL Take 2 tablets (650 mg total) by mouth every 6 (six) hours as needed for mild pain.   albuterol 108 (90 Base) MCG/ACT inhaler Commonly known as: VENTOLIN HFA Inhale 1-2 puffs into the lungs every 4 (four) hours as needed for wheezing or shortness of breath.   ALPRAZolam 1 MG tablet Commonly known as: XANAX Take 1 mg  by mouth 4 (four) times daily.   amLODipine 10 MG tablet Commonly known as: NORVASC Take 10 mg by mouth daily.   diclofenac Sodium 1 % Gel Commonly known as: VOLTAREN Apply 4 g topically 4 (four) times daily as needed (Apply to painful area over left upper front chest.).   doxycycline 100 MG tablet Commonly known as: VIBRA-TABS Take 1 tablet (100 mg total) by mouth every 12 (twelve) hours.   ertapenem  IVPB Commonly known as: INVANZ Inject 1 g into the vein daily. Indication:  Osteomyelitis First Dose: No Last Day of Therapy:  06/20/22 Labs - Once weekly:  CBC/D and BMP, Labs - Every other week:  ESR and CRP Method of administration: Mini-Bag Plus / Gravity Method of administration may be changed at the discretion of home infusion pharmacist based upon assessment of the patient and/or caregiver's ability to self-administer the medication ordered.   folic acid 1 MG tablet Commonly known as: FOLVITE Take 1 tablet (1 mg total) by mouth daily.   metFORMIN 500 MG tablet Commonly known as: GLUCOPHAGE Take 500 mg by mouth 2 (two) times daily.   morphine 30 MG 12 hr tablet Commonly known as: MS CONTIN Take 30 mg by mouth every 12 (twelve) hours.   multivitamin with minerals Tabs tablet Take 1 tablet by mouth daily.   oxycodone 30 MG immediate release tablet Commonly known as: ROXICODONE Take 30 mg by mouth 4 (four) times daily as needed.   thiamine 100 MG tablet Take 1 tablet (100 mg total) by mouth daily.               Discharge Care Instructions  (From admission, onward)            Start     Ordered   05/24/22 0000  Change dressing on IV access line weekly and PRN  (Home infusion instructions - [facility] )        05/24/22 1107            Procedures/Studies: [place] EKG SITE RITE  Result Date: 05/23/2022 If Site Rite image not attached, placement could not be confirmed due to current cardiac rhythm.  MR BRAIN WO CONTRAST  Result Date: 05/23/2022 CLINICAL DATA:  Neuro deficit, acute, stroke suspected EXAM: MRI HEAD WITHOUT CONTRAST TECHNIQUE: Multiplanar, multiecho pulse sequences of the brain and surrounding structures were obtained without intravenous contrast. COMPARISON:  CT head 06/21/2020. FINDINGS: Brain: No acute infarction, hemorrhage, hydrocephalus, extra-axial collection or mass lesion. Moderate scattered T2/FLAIR hyperintensities the white matter, nonspecific but compatible with chronic microvascular ischemic disease. Vascular: Major arterial flow voids are maintained at the skull base. Skull and upper cervical spine: Choose 1 Sinuses/Orbits: Mild paranasal sinus mucosal thickening. No acute orbital findings. Other: No mastoid effusions. IMPRESSION: 1. No evidence of acute intracranial abnormality. 2. Moderate presumed chronic microvascular ischemic disease. Electronically Signed   By: Margaretha Sheffield M.D.   On: 05/23/2022 09:59   US ARTERIAL [name] (SCREENING LOWER EXTREMITY)  Result Date: 05/23/2022 CLINICAL DATA:  Peripheral vascular disease, history of prior left lower extremity below the knee amputation EXAM: NONINVASIVE PHYSIOLOGIC VASCULAR STUDY OF BILATERAL LOWER EXTREMITIES TECHNIQUE: Evaluation of both lower extremities were performed at rest, including calculation of ankle-brachial indices with single level Doppler, pressure and pulse volume recording. COMPARISON:  None Available. FINDINGS: Right [name]:  1.2 Left [name]:  Left below the knee amputation Right Lower Extremity:  Normal arterial waveforms at the ankle. Left Lower Extremity:   Left below the knee amputation  1.0-1.4 Normal IMPRESSION: Normal resting right ankle-brachial index. Electronically Signed   By: Jacqulynn Cadet M.D.   On: 05/23/2022 07:37   MR FOOT RIGHT W WO CONTRAST  Result Date: 05/20/2022 CLINICAL DATA:  Diabetic.  Foot swelling.  Osteomyelitis suspected. EXAM: MRI OF THE RIGHT FOREFOOT WITHOUT AND WITH CONTRAST TECHNIQUE: Multiplanar, multisequence MR imaging of the right forefoot was performed before and after the administration of intravenous contrast. CONTRAST:  29m GADAVIST GADOBUTROL 1 MMOL/ML IV SOLN COMPARISON:  right ankle and foot; right foot radiographs 12/04/2021; MRI right forefoot radiographs 05/20/2022 FINDINGS: Bones/Joint/Cartilage Postsurgical changes are again seen of amputation of the first through fifth rays at the level of the proximal to mid metatarsal shafts. Given the recent surgery 12/2021 there is expected lack of cortex at the distal aspect of the each second through fifth metatarsal amputation site limiting evaluation for new cortical erosion and osteomyelitis. Nevertheless, there is marrow edema within multiple metatarsals concerning for acute osteomyelitis. This marrow edema is greatest within the third and fourth metatarsals wear involves the majority of the metatarsal shafts (coronal series 10 images 20 through 23). There is also moderate to high-grade marrow edema throughout the majority of the remaining fifth metatarsal shaft (coronal series 10 images 22 and 23 and axial series 7, images 18 through 23). There is also trace marrow edema at the distal aspect of the more chronic first metatarsal amputation stump (coronal series 10 image 19). Given the edema and swelling of the soft tissues around the first through fifth distal amputation stumps, this marrow edema remains concerning for osteomyelitis. Ligaments The Lisfranc ligament complex appears intact. Muscles and Tendons There is mild-to-moderate diffuse edema within the extensor  digitorum brevis and the abductor hallucis longus and flexor digitorum brevis muscles, nonspecific myositis. Soft tissues There is moderate medial and lateral ankle and dorsal foot subcutaneous fat soft tissue swelling and edema. There is more mild edema and swelling just distal to the first through fifth metatarsal amputation stumps. There is ulceration within the soft tissues at the plantar medial aspect of the postsurgical forefoot, distal and medial to the first metatarsal amputation site (coronal series 10, image 25; axial series 7, image 8, and sagittal series 8, image 25). IMPRESSION: 1. There is a new soft tissue ulcer within the medial forefoot just distal plantar coronal and medial to the first metatarsal amputation stump. There is edema and swelling of the ankle and dorsal midfoot soft tissues extending anterior to the first through fifth amputation stumps. 2. As described above, there is marrow edema within the third and fourth greater than fifth remaining metatarsal shafts and very mild marrow edema within the distal aspect of the remaining first and second metatarsal shafts. These findings remain concerning for osteomyelitis involving these regions of the metatarsals, again greatest within the third through fifth metatarsal shafts. Electronically Signed   By: RYvonne KendallM.D.   On: 05/20/2022 19:31   DG Tibia/Fibula Right  Result Date: 05/20/2022 CLINICAL DATA:  Right lower leg pain and swelling. EXAM: RIGHT TIBIA AND FIBULA - 2 VIEW COMPARISON:  None Available. FINDINGS: There is no evidence of fracture or other focal bone lesions. Soft tissues are unremarkable. IMPRESSION: Negative. Electronically Signed   By: JMarijo ConceptionM.D.   On: 05/20/2022 16:27   DG Ankle Complete Right  Result Date: 05/20/2022 CLINICAL DATA:  Right foot pain and swelling. EXAM: RIGHT ANKLE - COMPLETE 3+ VIEW COMPARISON:  None Available. FINDINGS: There is no evidence of fracture, dislocation, or  joint effusion.  There is no evidence of arthropathy or other focal bone abnormality. Soft tissues are unremarkable. IMPRESSION: Negative. Electronically Signed   By: Marijo Conception M.D.   On: 05/20/2022 16:26   DG Foot Complete Right  Result Date: 05/20/2022 CLINICAL DATA:  Right foot wound. EXAM: RIGHT FOOT COMPLETE - 3+ VIEW COMPARISON:  December 04, 2021. FINDINGS: Status post surgical amputation at the level of the proximal metatarsals. No definite lytic destruction is seen to suggest osteomyelitis. Large soft tissue ulceration is seen anterior to the residual first metatarsal. IMPRESSION: Large soft tissue ulceration is noted. No definite evidence of osteomyelitis is noted. Postsurgical changes as described above. Electronically Signed   By: Marijo Conception M.D.   On: 05/20/2022 16:25   DG Chest Port 1 View  Result Date: 05/20/2022 CLINICAL DATA:  Sepsis. EXAM: PORTABLE CHEST 1 VIEW COMPARISON:  Mar 01, 2022. FINDINGS: The heart size and mediastinal contours are within normal limits. Both lungs are clear. The visualized skeletal structures are unremarkable. IMPRESSION: No active disease. Electronically Signed   By: Marijo Conception M.D.   On: 05/20/2022 16:23     Subjective: Pt reports no specific complaints, remains agreeable to do home IV antibiotics and PICC line  Discharge Exam: Vitals:   05/23/22 2133 05/24/22 0433  BP: (!) 151/88 (!) 158/76  Pulse: 62 61  Resp: 17 17  Temp: 98.2 F (36.8 C) 98 F (36.7 C)  SpO2: 99% 98%   Vitals:   05/23/22 0349 05/23/22 1449 05/23/22 2133 05/24/22 0433  BP: (!) 152/82 (!) 133/92 (!) 151/88 (!) 158/76  Pulse: 65 66 62 61  Resp: _0 Temp: 97.9 F (36.6 C) 98.2 F (36.8 C) 98.2 F (36.8 C) 98 F (36.7 C)  TempSrc: Oral Oral Oral Oral  SpO2: 93% 98% 99% 98%  Weight:      Height:       General: Pt is alert, awake, not in acute distress Cardiovascular: normal S1/S2 +, no rubs, no gallops Respiratory: CTA bilaterally, no wheezing, no  rhonchi Abdominal: Soft, NT, ND, bowel sounds + Extremities: bandages clean, dry, intact    The results of significant diagnostics from this hospitalization (including imaging, microbiology, ancillary and laboratory) are listed below for reference.     Microbiology: Recent Results (from the past 240 hour(s))  Blood Culture (routine x 2)     Status: None (Preliminary result)   Collection Time: 05/20/22  3:56 PM   Specimen: Right Antecubital; Blood  Result Value Ref Range Status   Specimen Description   Final    RIGHT ANTECUBITAL BOTTLES DRAWN AEROBIC AND ANAEROBIC   Special Requests   Final    Blood Culture results may not be optimal due to an excessive volume of blood received in culture bottles   Culture   Final    NO GROWTH 4 DAYS Performed at [facility], [address] [place], [place] [address]    Report Status PENDING  Incomplete  Blood Culture (routine x 2)     Status: None (Preliminary result)   Collection Time: 05/20/22  3:56 PM   Specimen: BLOOD LEFT FOREARM  Result Value Ref Range Status   Specimen Description   Final    BLOOD LEFT FOREARM BOTTLES DRAWN AEROBIC AND ANAEROBIC   Special Requests Blood Culture adequate volume  Final   Culture   Final    NO GROWTH 4 DAYS Performed at [facility], [address] [place], [place] [address]  Report Status PENDING  Incomplete     Labs: BNP (last 3 results) No results for input(s): "BNP" in the last 8760 hours. Basic Metabolic Panel: Recent Labs  Lab 05/20/22 1556 05/21/22 0537 05/22/22 0630 05/23/22 0605  NA 142 141 137 139  K 3.7 3.9 3.6 3.9  CL 111 113* 108 109  CO2 _0 GLUCOSE 105* 99 105* 115*  BUN 26* _1 CREATININE 0.96 0.63 0.62 0.62  CALCIUM 8.7* 8.3* 8.8* 8.9  MG  --   --  1.6*  --    Liver Function Tests: Recent Labs  Lab 05/20/22 1556  AST 24  ALT 12  ALKPHOS 78  BILITOT 0.6  PROT 6.6  ALBUMIN 3.1*   No results for input(s): "LIPASE", "AMYLASE" in the last 168  hours. No results for input(s): "AMMONIA" in the last 168 hours. CBC: Recent Labs  Lab 05/20/22 1556 05/21/22 0537 05/22/22 0630  WBC 7.8 5.9 5.3  NEUTROABS 4.2  --   --   HGB 13.8 13.8 13.2  HCT 41.3 41.5 39.8  MCV 95.2 96.1 95.7  PLT 161 141* 144*   Cardiac Enzymes: No results for input(s): "CKTOTAL", "CKMB", "CKMBINDEX", "TROPONINI" in the last 168 hours. BNP: Invalid input(s): "POCBNP" CBG: Recent Labs  Lab 05/23/22 0707 05/23/22 1109 05/23/22 1551 05/23/22 2210 05/24/22 0717  GLUCAP 113* 221* 134* 143* 115*   D-Dimer No results for input(s): "DDIMER" in the last 72 hours. Hgb A1c No results for input(s): "HGBA1C" in the last 72 hours. Lipid Profile Recent Labs    05/24/22 0534  CHOL 112  HDL 25*  LDLCALC 70  TRIG 84  CHOLHDL 4.5   Thyroid function studies No results for input(s): "TSH", "T4TOTAL", "T3FREE", "THYROIDAB" in the last 72 hours.  Invalid input(s): "FREET3" Anemia work up No results for input(s): "VITAMINB12", "FOLATE", "FERRITIN", "TIBC", "IRON", "RETICCTPCT" in the last 72 hours. Urinalysis    Component Value Date/Time   COLORURINE STRAW (A) 03/29/2021 1705   APPEARANCEUR CLEAR 03/29/2021 1705   LABSPEC 1.010 03/29/2021 1705   PHURINE 6.0 03/29/2021 1705   GLUCOSEU NEGATIVE 03/29/2021 1705   HGBUR MODERATE (A) 03/29/2021 1705   BILIRUBINUR NEGATIVE 03/29/2021 1705   KETONESUR NEGATIVE 03/29/2021 1705   PROTEINUR NEGATIVE 03/29/2021 1705   UROBILINOGEN 4.0 (H) 10/09/2008 1114   NITRITE NEGATIVE 03/29/2021 1705   LEUKOCYTESUR NEGATIVE 03/29/2021 1705   Sepsis Labs Recent Labs  Lab 05/20/22 1556 05/21/22 0537 05/22/22 0630  WBC 7.8 5.9 5.3   Microbiology Recent Results (from the past 240 hour(s))  Blood Culture (routine x 2)     Status: None (Preliminary result)   Collection Time: 05/20/22  3:56 PM   Specimen: Right Antecubital; Blood  Result Value Ref Range Status   Specimen Description   Final    RIGHT ANTECUBITAL  BOTTLES DRAWN AEROBIC AND ANAEROBIC   Special Requests   Final    Blood Culture results may not be optimal due to an excessive volume of blood received in culture bottles   Culture   Final    NO GROWTH 4 DAYS Performed at [facility], [address] [place], [place] [address]    Report Status PENDING  Incomplete  Blood Culture (routine x 2)     Status: None (Preliminary result)   Collection Time: 05/20/22  3:56 PM   Specimen: BLOOD LEFT FOREARM  Result Value Ref Range Status   Specimen Description   Final    BLOOD LEFT FOREARM BOTTLES  DRAWN AEROBIC AND ANAEROBIC   Special Requests Blood Culture adequate volume  Final   Culture   Final    NO GROWTH 4 DAYS Performed at [facility], [address] [place], [place] [address]    Report Status PENDING  Incomplete   Time coordinating discharge: 37 mins  SIGNED:  Irwin Brakeman, MD  [facility] 05/24/2022, 11:10 AM How to contact the [facility] Attending or Consulting provider [place] or covering provider during after hours [place], for this patient?  Check the care team in [facility] and look for a) attending/consulting [facility] provider listed and b) the [facility] team listed Log into [email or web address] and use [place]'s universal password to access. If you do not have the password, please contact the hospital operator. Locate the [facility] provider you are looking for under [facility] and page to a number that you can be directly reached. If you still have difficulty reaching the provider, please page the [facility] (Director on Call) for the Hospitalists listed on amion for assistance.

## 2022-05-25 DIAGNOSIS — M86371 Chronic multifocal osteomyelitis, right ankle and foot: Secondary | ICD-10-CM | POA: Diagnosis not present

## 2022-05-25 LAB — CULTURE, BLOOD (ROUTINE X 2)
Culture: NO GROWTH
Culture: NO GROWTH
Special Requests: ADEQUATE

## 2022-05-26 DIAGNOSIS — T8743 Infection of amputation stump, right lower extremity: Secondary | ICD-10-CM | POA: Diagnosis not present

## 2022-05-26 DIAGNOSIS — Z89512 Acquired absence of left leg below knee: Secondary | ICD-10-CM | POA: Diagnosis not present

## 2022-05-26 DIAGNOSIS — Z9181 History of falling: Secondary | ICD-10-CM | POA: Diagnosis not present

## 2022-05-26 DIAGNOSIS — G894 Chronic pain syndrome: Secondary | ICD-10-CM | POA: Diagnosis not present

## 2022-05-26 DIAGNOSIS — D696 Thrombocytopenia, unspecified: Secondary | ICD-10-CM | POA: Diagnosis not present

## 2022-05-26 DIAGNOSIS — L89893 Pressure ulcer of other site, stage 3: Secondary | ICD-10-CM | POA: Diagnosis not present

## 2022-05-26 DIAGNOSIS — Z87891 Personal history of nicotine dependence: Secondary | ICD-10-CM | POA: Diagnosis not present

## 2022-05-26 DIAGNOSIS — Z792 Long term (current) use of antibiotics: Secondary | ICD-10-CM | POA: Diagnosis not present

## 2022-05-26 DIAGNOSIS — J45909 Unspecified asthma, uncomplicated: Secondary | ICD-10-CM | POA: Diagnosis not present

## 2022-05-26 DIAGNOSIS — E1169 Type 2 diabetes mellitus with other specified complication: Secondary | ICD-10-CM | POA: Diagnosis not present

## 2022-05-26 DIAGNOSIS — M86371 Chronic multifocal osteomyelitis, right ankle and foot: Secondary | ICD-10-CM | POA: Diagnosis not present

## 2022-05-26 DIAGNOSIS — B965 Pseudomonas (aeruginosa) (mallei) (pseudomallei) as the cause of diseases classified elsewhere: Secondary | ICD-10-CM | POA: Diagnosis not present

## 2022-05-26 DIAGNOSIS — F32A Depression, unspecified: Secondary | ICD-10-CM | POA: Diagnosis not present

## 2022-05-26 DIAGNOSIS — E1142 Type 2 diabetes mellitus with diabetic polyneuropathy: Secondary | ICD-10-CM | POA: Diagnosis not present

## 2022-05-26 DIAGNOSIS — I10 Essential (primary) hypertension: Secondary | ICD-10-CM | POA: Diagnosis not present

## 2022-05-26 DIAGNOSIS — Z7984 Long term (current) use of oral hypoglycemic drugs: Secondary | ICD-10-CM | POA: Diagnosis not present

## 2022-05-26 DIAGNOSIS — E7800 Pure hypercholesterolemia, unspecified: Secondary | ICD-10-CM | POA: Diagnosis not present

## 2022-05-26 DIAGNOSIS — E1151 Type 2 diabetes mellitus with diabetic peripheral angiopathy without gangrene: Secondary | ICD-10-CM | POA: Diagnosis not present

## 2022-05-26 DIAGNOSIS — K219 Gastro-esophageal reflux disease without esophagitis: Secondary | ICD-10-CM | POA: Diagnosis not present

## 2022-05-26 DIAGNOSIS — E1159 Type 2 diabetes mellitus with other circulatory complications: Secondary | ICD-10-CM | POA: Diagnosis not present

## 2022-05-26 DIAGNOSIS — E538 Deficiency of other specified B group vitamins: Secondary | ICD-10-CM | POA: Diagnosis not present

## 2022-05-26 DIAGNOSIS — Z89431 Acquired absence of right foot: Secondary | ICD-10-CM | POA: Diagnosis not present

## 2022-06-01 DIAGNOSIS — I10 Essential (primary) hypertension: Secondary | ICD-10-CM | POA: Diagnosis not present

## 2022-06-01 DIAGNOSIS — E7800 Pure hypercholesterolemia, unspecified: Secondary | ICD-10-CM | POA: Diagnosis not present

## 2022-06-01 DIAGNOSIS — K219 Gastro-esophageal reflux disease without esophagitis: Secondary | ICD-10-CM | POA: Diagnosis not present

## 2022-06-01 DIAGNOSIS — E538 Deficiency of other specified B group vitamins: Secondary | ICD-10-CM | POA: Diagnosis not present

## 2022-06-01 DIAGNOSIS — E1159 Type 2 diabetes mellitus with other circulatory complications: Secondary | ICD-10-CM | POA: Diagnosis not present

## 2022-06-01 DIAGNOSIS — E1142 Type 2 diabetes mellitus with diabetic polyneuropathy: Secondary | ICD-10-CM | POA: Diagnosis not present

## 2022-06-01 DIAGNOSIS — F32A Depression, unspecified: Secondary | ICD-10-CM | POA: Diagnosis not present

## 2022-06-01 DIAGNOSIS — L89893 Pressure ulcer of other site, stage 3: Secondary | ICD-10-CM | POA: Diagnosis not present

## 2022-06-01 DIAGNOSIS — Z89431 Acquired absence of right foot: Secondary | ICD-10-CM | POA: Diagnosis not present

## 2022-06-01 DIAGNOSIS — Z7984 Long term (current) use of oral hypoglycemic drugs: Secondary | ICD-10-CM | POA: Diagnosis not present

## 2022-06-01 DIAGNOSIS — Z89512 Acquired absence of left leg below knee: Secondary | ICD-10-CM | POA: Diagnosis not present

## 2022-06-01 DIAGNOSIS — D696 Thrombocytopenia, unspecified: Secondary | ICD-10-CM | POA: Diagnosis not present

## 2022-06-01 DIAGNOSIS — Z792 Long term (current) use of antibiotics: Secondary | ICD-10-CM | POA: Diagnosis not present

## 2022-06-01 DIAGNOSIS — G894 Chronic pain syndrome: Secondary | ICD-10-CM | POA: Diagnosis not present

## 2022-06-01 DIAGNOSIS — T8743 Infection of amputation stump, right lower extremity: Secondary | ICD-10-CM | POA: Diagnosis not present

## 2022-06-01 DIAGNOSIS — B965 Pseudomonas (aeruginosa) (mallei) (pseudomallei) as the cause of diseases classified elsewhere: Secondary | ICD-10-CM | POA: Diagnosis not present

## 2022-06-01 DIAGNOSIS — Z87891 Personal history of nicotine dependence: Secondary | ICD-10-CM | POA: Diagnosis not present

## 2022-06-01 DIAGNOSIS — M86371 Chronic multifocal osteomyelitis, right ankle and foot: Secondary | ICD-10-CM | POA: Diagnosis not present

## 2022-06-01 DIAGNOSIS — J45909 Unspecified asthma, uncomplicated: Secondary | ICD-10-CM | POA: Diagnosis not present

## 2022-06-01 DIAGNOSIS — E1169 Type 2 diabetes mellitus with other specified complication: Secondary | ICD-10-CM | POA: Diagnosis not present

## 2022-06-01 DIAGNOSIS — E1151 Type 2 diabetes mellitus with diabetic peripheral angiopathy without gangrene: Secondary | ICD-10-CM | POA: Diagnosis not present

## 2022-06-01 DIAGNOSIS — Z9181 History of falling: Secondary | ICD-10-CM | POA: Diagnosis not present

## 2022-06-02 DIAGNOSIS — M86371 Chronic multifocal osteomyelitis, right ankle and foot: Secondary | ICD-10-CM | POA: Diagnosis not present

## 2022-06-02 DIAGNOSIS — E1169 Type 2 diabetes mellitus with other specified complication: Secondary | ICD-10-CM | POA: Diagnosis not present

## 2022-06-02 DIAGNOSIS — B965 Pseudomonas (aeruginosa) (mallei) (pseudomallei) as the cause of diseases classified elsewhere: Secondary | ICD-10-CM | POA: Diagnosis not present

## 2022-06-02 DIAGNOSIS — T8743 Infection of amputation stump, right lower extremity: Secondary | ICD-10-CM | POA: Diagnosis not present

## 2022-06-03 DIAGNOSIS — Z7984 Long term (current) use of oral hypoglycemic drugs: Secondary | ICD-10-CM | POA: Diagnosis not present

## 2022-06-03 DIAGNOSIS — E1151 Type 2 diabetes mellitus with diabetic peripheral angiopathy without gangrene: Secondary | ICD-10-CM | POA: Diagnosis not present

## 2022-06-03 DIAGNOSIS — G894 Chronic pain syndrome: Secondary | ICD-10-CM | POA: Diagnosis not present

## 2022-06-03 DIAGNOSIS — Z89431 Acquired absence of right foot: Secondary | ICD-10-CM | POA: Diagnosis not present

## 2022-06-03 DIAGNOSIS — B965 Pseudomonas (aeruginosa) (mallei) (pseudomallei) as the cause of diseases classified elsewhere: Secondary | ICD-10-CM | POA: Diagnosis not present

## 2022-06-03 DIAGNOSIS — I10 Essential (primary) hypertension: Secondary | ICD-10-CM | POA: Diagnosis not present

## 2022-06-03 DIAGNOSIS — E7800 Pure hypercholesterolemia, unspecified: Secondary | ICD-10-CM | POA: Diagnosis not present

## 2022-06-03 DIAGNOSIS — J45909 Unspecified asthma, uncomplicated: Secondary | ICD-10-CM | POA: Diagnosis not present

## 2022-06-03 DIAGNOSIS — F32A Depression, unspecified: Secondary | ICD-10-CM | POA: Diagnosis not present

## 2022-06-03 DIAGNOSIS — Z89512 Acquired absence of left leg below knee: Secondary | ICD-10-CM | POA: Diagnosis not present

## 2022-06-03 DIAGNOSIS — K219 Gastro-esophageal reflux disease without esophagitis: Secondary | ICD-10-CM | POA: Diagnosis not present

## 2022-06-03 DIAGNOSIS — L89893 Pressure ulcer of other site, stage 3: Secondary | ICD-10-CM | POA: Diagnosis not present

## 2022-06-03 DIAGNOSIS — E538 Deficiency of other specified B group vitamins: Secondary | ICD-10-CM | POA: Diagnosis not present

## 2022-06-03 DIAGNOSIS — E1169 Type 2 diabetes mellitus with other specified complication: Secondary | ICD-10-CM | POA: Diagnosis not present

## 2022-06-03 DIAGNOSIS — E1142 Type 2 diabetes mellitus with diabetic polyneuropathy: Secondary | ICD-10-CM | POA: Diagnosis not present

## 2022-06-03 DIAGNOSIS — Z792 Long term (current) use of antibiotics: Secondary | ICD-10-CM | POA: Diagnosis not present

## 2022-06-03 DIAGNOSIS — D696 Thrombocytopenia, unspecified: Secondary | ICD-10-CM | POA: Diagnosis not present

## 2022-06-03 DIAGNOSIS — Z9181 History of falling: Secondary | ICD-10-CM | POA: Diagnosis not present

## 2022-06-03 DIAGNOSIS — T8743 Infection of amputation stump, right lower extremity: Secondary | ICD-10-CM | POA: Diagnosis not present

## 2022-06-03 DIAGNOSIS — M86371 Chronic multifocal osteomyelitis, right ankle and foot: Secondary | ICD-10-CM | POA: Diagnosis not present

## 2022-06-03 DIAGNOSIS — Z87891 Personal history of nicotine dependence: Secondary | ICD-10-CM | POA: Diagnosis not present

## 2022-06-03 DIAGNOSIS — E1159 Type 2 diabetes mellitus with other circulatory complications: Secondary | ICD-10-CM | POA: Diagnosis not present

## 2022-06-06 ENCOUNTER — Emergency Department (HOSPITAL_COMMUNITY)
Admission: EM | Admit: 2022-06-06 | Discharge: 2022-06-07 | Disposition: A | Discharge status: Home / Self Care | Payer: Medicare Other | Attending: Emergency Medicine | Admitting: Emergency Medicine

## 2022-06-06 ENCOUNTER — Encounter (HOSPITAL_COMMUNITY): Payer: Self-pay

## 2022-06-06 ENCOUNTER — Encounter: Payer: Self-pay | Admitting: Infectious Disease

## 2022-06-06 ENCOUNTER — Emergency Department (HOSPITAL_COMMUNITY): Payer: Medicare Other

## 2022-06-06 DIAGNOSIS — E1151 Type 2 diabetes mellitus with diabetic peripheral angiopathy without gangrene: Secondary | ICD-10-CM | POA: Diagnosis not present

## 2022-06-06 DIAGNOSIS — R55 Syncope and collapse: Secondary | ICD-10-CM | POA: Diagnosis not present

## 2022-06-06 DIAGNOSIS — S79911A Unspecified injury of right hip, initial encounter: Secondary | ICD-10-CM | POA: Diagnosis not present

## 2022-06-06 DIAGNOSIS — R404 Transient alteration of awareness: Secondary | ICD-10-CM | POA: Diagnosis not present

## 2022-06-06 DIAGNOSIS — Z79899 Other long term (current) drug therapy: Secondary | ICD-10-CM | POA: Diagnosis not present

## 2022-06-06 DIAGNOSIS — G546 Phantom limb syndrome with pain: Secondary | ICD-10-CM | POA: Diagnosis not present

## 2022-06-06 DIAGNOSIS — R0902 Hypoxemia: Secondary | ICD-10-CM

## 2022-06-06 DIAGNOSIS — Z7984 Long term (current) use of oral hypoglycemic drugs: Secondary | ICD-10-CM | POA: Diagnosis not present

## 2022-06-06 DIAGNOSIS — M25551 Pain in right hip: Secondary | ICD-10-CM | POA: Insufficient documentation

## 2022-06-06 DIAGNOSIS — E7800 Pure hypercholesterolemia, unspecified: Secondary | ICD-10-CM | POA: Diagnosis not present

## 2022-06-06 DIAGNOSIS — Z89512 Acquired absence of left leg below knee: Secondary | ICD-10-CM | POA: Insufficient documentation

## 2022-06-06 DIAGNOSIS — K219 Gastro-esophageal reflux disease without esophagitis: Secondary | ICD-10-CM | POA: Diagnosis not present

## 2022-06-06 DIAGNOSIS — F32A Depression, unspecified: Secondary | ICD-10-CM | POA: Diagnosis not present

## 2022-06-06 DIAGNOSIS — R0689 Other abnormalities of breathing: Secondary | ICD-10-CM | POA: Diagnosis not present

## 2022-06-06 DIAGNOSIS — J45909 Unspecified asthma, uncomplicated: Secondary | ICD-10-CM | POA: Diagnosis not present

## 2022-06-06 DIAGNOSIS — E538 Deficiency of other specified B group vitamins: Secondary | ICD-10-CM | POA: Diagnosis not present

## 2022-06-06 DIAGNOSIS — D696 Thrombocytopenia, unspecified: Secondary | ICD-10-CM | POA: Diagnosis not present

## 2022-06-06 DIAGNOSIS — R4189 Other symptoms and signs involving cognitive functions and awareness: Secondary | ICD-10-CM

## 2022-06-06 DIAGNOSIS — E1140 Type 2 diabetes mellitus with diabetic neuropathy, unspecified: Secondary | ICD-10-CM | POA: Diagnosis not present

## 2022-06-06 DIAGNOSIS — E119 Type 2 diabetes mellitus without complications: Secondary | ICD-10-CM | POA: Insufficient documentation

## 2022-06-06 DIAGNOSIS — M160 Bilateral primary osteoarthritis of hip: Secondary | ICD-10-CM | POA: Insufficient documentation

## 2022-06-06 DIAGNOSIS — R4020 Unspecified coma: Secondary | ICD-10-CM | POA: Diagnosis not present

## 2022-06-06 DIAGNOSIS — E1142 Type 2 diabetes mellitus with diabetic polyneuropathy: Secondary | ICD-10-CM | POA: Diagnosis not present

## 2022-06-06 DIAGNOSIS — Z87891 Personal history of nicotine dependence: Secondary | ICD-10-CM | POA: Diagnosis not present

## 2022-06-06 DIAGNOSIS — L89893 Pressure ulcer of other site, stage 3: Secondary | ICD-10-CM | POA: Diagnosis not present

## 2022-06-06 DIAGNOSIS — T8743 Infection of amputation stump, right lower extremity: Secondary | ICD-10-CM | POA: Diagnosis not present

## 2022-06-06 DIAGNOSIS — I10 Essential (primary) hypertension: Secondary | ICD-10-CM | POA: Insufficient documentation

## 2022-06-06 DIAGNOSIS — Z792 Long term (current) use of antibiotics: Secondary | ICD-10-CM | POA: Diagnosis not present

## 2022-06-06 DIAGNOSIS — Z89421 Acquired absence of other right toe(s): Secondary | ICD-10-CM | POA: Diagnosis not present

## 2022-06-06 DIAGNOSIS — Z9181 History of falling: Secondary | ICD-10-CM | POA: Diagnosis not present

## 2022-06-06 DIAGNOSIS — E1159 Type 2 diabetes mellitus with other circulatory complications: Secondary | ICD-10-CM | POA: Diagnosis not present

## 2022-06-06 DIAGNOSIS — S3993XA Unspecified injury of pelvis, initial encounter: Secondary | ICD-10-CM | POA: Diagnosis not present

## 2022-06-06 DIAGNOSIS — B965 Pseudomonas (aeruginosa) (mallei) (pseudomallei) as the cause of diseases classified elsewhere: Secondary | ICD-10-CM | POA: Diagnosis not present

## 2022-06-06 DIAGNOSIS — G894 Chronic pain syndrome: Secondary | ICD-10-CM | POA: Diagnosis not present

## 2022-06-06 DIAGNOSIS — Z89431 Acquired absence of right foot: Secondary | ICD-10-CM | POA: Diagnosis not present

## 2022-06-06 DIAGNOSIS — M86371 Chronic multifocal osteomyelitis, right ankle and foot: Secondary | ICD-10-CM | POA: Diagnosis not present

## 2022-06-06 DIAGNOSIS — E1169 Type 2 diabetes mellitus with other specified complication: Secondary | ICD-10-CM | POA: Diagnosis not present

## 2022-06-06 LAB — BLOOD GAS, VENOUS
Acid-Base Excess: 0.5 mmol/L (ref 0.0–2.0)
Bicarbonate: 25.4 mmol/L (ref 20.0–28.0)
Drawn by: 6643
FIO2: 32 %
O2 Saturation: 95.6 %
Patient temperature: 36.6
pCO2, Ven: 40 mmHg — ABNORMAL LOW (ref 44–60)
pH, Ven: 7.41 (ref 7.25–7.43)
pO2, Ven: 68 mmHg — ABNORMAL HIGH (ref 32–45)

## 2022-06-06 LAB — COMPREHENSIVE METABOLIC PANEL
ALT: 20 U/L (ref 0–44)
AST: 25 U/L (ref 15–41)
Albumin: 3.1 g/dL — ABNORMAL LOW (ref 3.5–5.0)
Alkaline Phosphatase: 85 U/L (ref 38–126)
Anion gap: 5 (ref 5–15)
BUN: 12 mg/dL (ref 6–20)
CO2: 22 mmol/L (ref 22–32)
Calcium: 8.2 mg/dL — ABNORMAL LOW (ref 8.9–10.3)
Chloride: 111 mmol/L (ref 98–111)
Creatinine, Ser: 0.87 mg/dL (ref 0.61–1.24)
GFR, Estimated: >60 mL/min (ref >60)
Glucose, Bld: 129 mg/dL — ABNORMAL HIGH (ref 70–99)
Potassium: 3.4 mmol/L — ABNORMAL LOW (ref 3.5–5.1)
Sodium: 138 mmol/L (ref 135–145)
Total Bilirubin: 0.7 mg/dL (ref 0.3–1.2)
Total Protein: 6.5 g/dL (ref 6.5–8.1)

## 2022-06-06 LAB — ETHANOL: Alcohol, Ethyl (B): <10 mg/dL (ref <10)

## 2022-06-06 LAB — RAPID URINE DRUG SCREEN, HOSP PERFORMED
Amphetamines: NOT DETECTED
Barbiturates: NOT DETECTED
Benzodiazepines: NOT DETECTED
Cocaine: NOT DETECTED
Opiates: NOT DETECTED
Tetrahydrocannabinol: POSITIVE — AB

## 2022-06-06 LAB — CBC WITH DIFFERENTIAL/PLATELET
Abs Immature Granulocytes: 0.04 10*3/uL (ref 0.00–0.07)
Basophils Absolute: 0.1 10*3/uL (ref 0.0–0.1)
Basophils Relative: 1 %
Eosinophils Absolute: 0.7 10*3/uL — ABNORMAL HIGH (ref 0.0–0.5)
Eosinophils Relative: 8 %
HCT: 42.9 % (ref 39.0–52.0)
Hemoglobin: 14.5 g/dL (ref 13.0–17.0)
Immature Granulocytes: 1 %
Lymphocytes Relative: 36 %
Lymphs Abs: 3.1 10*3/uL (ref 0.7–4.0)
MCH: 32.2 pg (ref 26.0–34.0)
MCHC: 33.8 g/dL (ref 30.0–36.0)
MCV: 95.1 fL (ref 80.0–100.0)
Monocytes Absolute: 0.4 10*3/uL (ref 0.1–1.0)
Monocytes Relative: 4 %
Neutro Abs: 4.3 10*3/uL (ref 1.7–7.7)
Neutrophils Relative %: 50 %
Platelets: 199 10*3/uL (ref 150–400)
RBC: 4.51 MIL/uL (ref 4.22–5.81)
RDW: 15 % (ref 11.5–15.5)
WBC: 8.5 10*3/uL (ref 4.0–10.5)
nRBC: 0 % (ref 0.0–0.2)

## 2022-06-06 LAB — CBG MONITORING, ED: Glucose-Capillary: 134 mg/dL — ABNORMAL HIGH (ref 70–99)

## 2022-06-06 NOTE — ED Notes (Signed)
Pt stripped and wanded by security Phone and revolver placed in security lock up

## 2022-06-06 NOTE — ED Provider Notes (Signed)
[facility] Provider Note   CSN: [id number] Arrival date & time: 06/06/22  2129     History  Chief Complaint  Patient presents with   Loss of Consciousness    Eugene Diaz is a 56 y.o. male.  HPI    56 y.o. 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  Patient was just admitted on 7-25 for osteomyelitis and is on IV antibiotics via PICC line.  Patient brought into the emergency room by EMS for being unresponsive.  Level 5 caveat for unresponsive patient  Per EMS, patient was found laying down on his porch.  There was concern for narcotic overdose, therefore he received 2 mg of Narcan and according to them, patient became slightly more responsive.  Patient lives with his roommates per mother, who we contacted.  She states that earlier today the patient was taken to his PCP visit and was acting appropriately.   She states that patient has pain medicine prescription and previously was heavy drinker.   Home Medications Prior to Admission medications   Medication Sig Start Date End Date Taking? Authorizing Provider  acetaminophen (TYLENOL) 325 MG tablet Take 2 tablets (650 mg total) by mouth every 6 (six) hours as needed for mild pain. 03/03/22   Hongalgi, Lenis Dickinson, MD  albuterol (VENTOLIN HFA) 108 (90 Base) MCG/ACT inhaler Inhale 1-2 puffs into the lungs every 4 (four) hours as needed for wheezing or shortness of breath. 05/11/22   [provider]  ALPRAZolam Duanne Moron) 1 MG tablet Take 1 mg by mouth 4 (four) times daily. 05/14/22   [provider]  amLODipine (NORVASC) 10 MG tablet Take 10 mg by mouth daily. 05/11/22   [provider]  diclofenac Sodium (VOLTAREN) 1 % GEL Apply 4 g topically 4 (four) times daily as needed (Apply to painful area over left upper front  chest.). 03/03/22   Hongalgi, Lenis Dickinson, MD  doxycycline (VIBRA-TABS) 100 MG tablet Take 1 tablet (100 mg total) by mouth every 12 (twelve) hours. 05/24/22 06/30/22  Johnson, Clanford L, MD  ertapenem (INVANZ) IVPB Inject 1 g into the vein daily. Indication:  Osteomyelitis First Dose: No Last Day of Therapy:  06/20/22 Labs - Once weekly:  CBC/D and BMP, Labs - Every other week:  ESR and CRP Method of administration: Mini-Bag Plus / Gravity Method of administration may be changed at the discretion of home infusion pharmacist based upon assessment of the patient and/or caregiver's ability to self-administer the medication ordered. 05/24/22 07/01/22  Johnson, Clanford L, MD  folic acid (FOLVITE) 1 MG tablet Take 1 tablet (1 mg total) by mouth daily. 03/03/22   Hongalgi, Lenis Dickinson, MD  metFORMIN (GLUCOPHAGE) 500 MG tablet Take 500 mg by mouth 2 (two) times daily. 05/11/22   [provider]  morphine (MS CONTIN) 30 MG 12 hr tablet Take 30 mg by mouth every 12 (twelve) hours. 05/14/22   [provider]  Multiple Vitamin (MULTIVITAMIN WITH MINERALS) TABS tablet Take 1 tablet by mouth daily. 03/04/22   Hongalgi, Lenis Dickinson, MD  oxycodone (ROXICODONE) 30 MG immediate release tablet Take 30 mg by mouth 4 (four) times daily as needed. 05/14/22   [provider]  thiamine 100 MG tablet Take 1 tablet (100 mg total) by mouth daily. 03/04/22   Hongalgi, Lenis Dickinson, MD      Allergies  Patient has no known allergies.    Review of Systems   Review of Systems  Physical Exam Updated Vital Signs BP 131/84   Pulse 72   Temp 97.9 F (36.6 C) (Oral)   Resp 14   Ht 6' 2" (1.88 m)   Wt 119.3 kg   SpO2 93%   BMI 33.77 kg/m  Physical Exam Vitals and nursing note reviewed.  Constitutional:      Appearance: He is well-developed.     Comments: Obtunded  HENT:     Head: Atraumatic.  Eyes:     Comments: Pupils are 5 mm and equal  Cardiovascular:     Rate and Rhythm: Normal rate.  Pulmonary:      Effort: Pulmonary effort is normal.  Musculoskeletal:     Cervical back: Neck supple.  Skin:    General: Skin is warm.  Neurological:     Mental Status: He is disoriented.     Comments: Response to sternal rub only     ED Results / Procedures / Treatments   Labs (all labs ordered are listed, but only abnormal results are displayed) Labs Reviewed  CBC WITH DIFFERENTIAL/PLATELET - Abnormal; Notable for the following components:      Result Value   Eosinophils Absolute 0.7 (*)    All other components within normal limits  COMPREHENSIVE METABOLIC PANEL - Abnormal; Notable for the following components:   Potassium 3.4 (*)    Glucose, Bld 129 (*)    Calcium 8.2 (*)    Albumin 3.1 (*)    All other components within normal limits  BLOOD GAS, VENOUS - Abnormal; Notable for the following components:   pCO2, Ven 40 (*)    pO2, Ven 68 (*)    All other components within normal limits  CBG MONITORING, ED - Abnormal; Notable for the following components:   Glucose-Capillary 134 (*)    All other components within normal limits  ETHANOL  RAPID URINE DRUG SCREEN, HOSP PERFORMED    EKG EKG Interpretation  Date/Time:  Monday June 06 2022 21:42:12 EDT Ventricular Rate:  84 PR Interval:  191 QRS Duration: 106 QT Interval:  380 QTC Calculation: 450 R Axis:   -55 Text Interpretation: Sinus rhythm Atrial premature complex Inferior infarct, old Anterior infarct, old No acute changes Confirmed by Varney Biles [phone]) on 06/06/2022 11:39:59 PM  Radiology CT Head Wo Contrast  Result Date: 06/06/2022 CLINICAL DATA:  Mental status change, unknown cause EXAM: CT HEAD WITHOUT CONTRAST TECHNIQUE: Contiguous axial images were obtained from the base of the skull through the vertex without intravenous contrast. RADIATION DOSE REDUCTION: This exam was performed according to the departmental dose-optimization program which includes automated exposure control, adjustment of the mA and/or kV according to  patient size and/or use of iterative reconstruction technique. COMPARISON:  CT head 06/21/2020 BRAIN: BRAIN Stable cerebral ventricle sizes are concordant with the degree of cerebral volume loss. patchy and confluent areas of decreased attenuation are noted throughout the deep and periventricular white matter of the cerebral hemispheres bilaterally, compatible with chronic microvascular ischemic disease. No evidence of large-territorial acute infarction. No parenchymal hemorrhage. No mass lesion. No extra-axial collection. No mass effect or midline shift. No hydrocephalus. Basilar cisterns are patent. Vascular: No hyperdense vessel. Skull: No acute fracture or focal lesion. Sinuses/Orbits: Paranasal sinuses and mastoid air cells are clear. The orbits are unremarkable. Other: None. IMPRESSION: No acute intracranial abnormality. Electronically Signed   By: Iven Finn M.D.   On: 06/06/2022 22:57  Procedures .Critical Care  Performed by: Varney Biles, MD Authorized by: Varney Biles, MD   Critical care provider statement:    Critical care time (minutes):  36   Critical care was necessary to treat or prevent imminent or life-threatening deterioration of the following conditions:  CNS failure or compromise and toxidrome   Critical care was time spent personally by me on the following activities:  Development of treatment plan with patient or surrogate, discussions with consultants, evaluation of patient's response to treatment, examination of patient, ordering and review of laboratory studies, ordering and review of radiographic studies, ordering and performing treatments and interventions, pulse oximetry, re-evaluation of patient's condition and review of old charts     Medications Ordered in ED Medications - No data to display  ED Course/ Medical Decision Making/ A&P                           Medical Decision Making Amount and/or Complexity of Data Reviewed Labs: ordered. Radiology:  ordered.   This patient presents to the ED with chief complaint(s) of unresponsiveness with pertinent past medical history of heavy narcotic use, alcohol abuse, lower extremity amputation and current osteomyelitis which further complicates the presenting complaint. The complaint involves an extensive differential diagnosis and also carries with it a high risk of complications and morbidity.    Patient response to sternal rub.  He appears to be moving all 4 extremities and responding to noxious stimuli.  Patient is protecting airway.  Unable to give any meaningful history.  Clinically appears that patient was likely is under the influence of drugs.   The differential diagnosis includes toxic encephalopathy secondary to drug use, metabolic encephalopathy, brain bleed,  The initial plan is to get CT scan of the brain.  We will get urine drug screen, ethanol level and basic blood work.  We will also get blood gas to ensure patient does not have hypercapnia.  Patient has been placed on end-tidal CO2, and we have his bed at 30 degrees to reduce risk of aspiration  Additional history obtained: Additional history obtained from family Records reviewed previous admission documents I have also reviewed patient's narcotic database.  Patient is taking Xanax, oxycodone 30 mg and morphine sulfate extended release 30 mg.  Independent labs interpretation:  The following labs were independently interpreted: Blood gases reassuring.  Ethanol level is normal.  Metabolic profile is also reassuring.  Independent visualization of imaging: - I independently visualized the following imaging with scope of interpretation limited to determining acute life threatening conditions related to emergency care: CT scan of the brain, which revealed no evidence of brain bleed  Treatment and Reassessment: Patient reassessed at 11:30 PM.  He is more responsive than he was.  He did respond to verbal stimuli, but not able to provide  meaningful history.  Current suspicion is that patient has toxic encephalopathy.  He will be observed in the ER for couple more hours by incoming team.  If patient does not improve, we will have to admit him to the hospital.    Final Clinical Impression(s) / ED Diagnoses Final diagnoses:  Unresponsive episode  Hypoxia    Rx / DC Orders ED Discharge Orders     None         Varney Biles, MD 06/06/22 [phone]

## 2022-06-06 NOTE — Discharge Instructions (Addendum)
Continue medications as previously prescribed.  Return to the emergency department if you experience any additional problems.

## 2022-06-06 NOTE — ED Notes (Signed)
Patient transported to CT 

## 2022-06-06 NOTE — ED Triage Notes (Signed)
Pt arrived via [facility], was found unresponsive on front porch and given 2 narcan in route to hospital. Pt not alert at this time, awaken due to painful stimuli only. VS stable

## 2022-06-07 ENCOUNTER — Encounter (HOSPITAL_BASED_OUTPATIENT_CLINIC_OR_DEPARTMENT_OTHER): Payer: Self-pay | Admitting: Cardiovascular Disease

## 2022-06-07 ENCOUNTER — Ambulatory Visit (INDEPENDENT_AMBULATORY_CARE_PROVIDER_SITE_OTHER): Payer: Medicare Other | Admitting: Cardiovascular Disease

## 2022-06-07 ENCOUNTER — Emergency Department (HOSPITAL_COMMUNITY): Payer: Medicare Other

## 2022-06-07 ENCOUNTER — Ambulatory Visit (INDEPENDENT_AMBULATORY_CARE_PROVIDER_SITE_OTHER): Payer: Medicare Other

## 2022-06-07 VITALS — BP 124/62 | HR 61 | Ht 74.0 in | Wt 245.2 lb

## 2022-06-07 DIAGNOSIS — S3993XA Unspecified injury of pelvis, initial encounter: Secondary | ICD-10-CM | POA: Diagnosis not present

## 2022-06-07 DIAGNOSIS — S79911A Unspecified injury of right hip, initial encounter: Secondary | ICD-10-CM | POA: Diagnosis not present

## 2022-06-07 DIAGNOSIS — R55 Syncope and collapse: Secondary | ICD-10-CM

## 2022-06-07 DIAGNOSIS — M160 Bilateral primary osteoarthritis of hip: Secondary | ICD-10-CM | POA: Diagnosis not present

## 2022-06-07 DIAGNOSIS — E785 Hyperlipidemia, unspecified: Secondary | ICD-10-CM

## 2022-06-07 DIAGNOSIS — I2510 Atherosclerotic heart disease of native coronary artery without angina pectoris: Secondary | ICD-10-CM

## 2022-06-07 DIAGNOSIS — Z720 Tobacco use: Secondary | ICD-10-CM | POA: Diagnosis not present

## 2022-06-07 DIAGNOSIS — M25551 Pain in right hip: Secondary | ICD-10-CM | POA: Diagnosis not present

## 2022-06-07 DIAGNOSIS — Z5181 Encounter for therapeutic drug level monitoring: Secondary | ICD-10-CM

## 2022-06-07 HISTORY — DX: Atherosclerotic heart disease of native coronary artery without angina pectoris: I25.10

## 2022-06-07 HISTORY — DX: Syncope and collapse: R55

## 2022-06-07 MED ORDER — ROSUVASTATIN CALCIUM 5 MG PO TABS: 5.0000 mg | ORAL_TABLET | Freq: Every day | ORAL | 3 refills | Status: DC

## 2022-06-07 MED ORDER — OXYCODONE-ACETAMINOPHEN 5-325 MG PO TABS
2.0000 | ORAL_TABLET | Freq: Once | ORAL | Status: AC
  Administered 2022-06-07: 2 via ORAL
  Filled 2022-06-07: qty 2

## 2022-06-07 NOTE — Assessment & Plan Note (Signed)
Mild nonobstructive disease on coronary CTA.  He has no symptoms.  Will start aspirin 81 mg.  Will also start rosuvastatin 5 mg.  Repeat lipids and a CMP in 2 to 3 months.

## 2022-06-07 NOTE — Patient Instructions (Signed)
Medication Instructions:  START ROSUVASTATIN 5 MG DAILY   START ASPIRIN 81 MG DAILY   *If you need a refill on your cardiac medications before your next appointment, please call your pharmacy*  Lab Work: FASTING LP/CMET IN 2-3 MONTHS   If you have labs (blood work) drawn today and your tests are completely normal, you will receive your results only by: [place] (if you have MyChart) OR A paper copy in the mail If you have any lab test that is abnormal or we need to change your treatment, we will call you to review the results.  Testing/Procedures: 14 DAY ZIO MONITOR   Follow-Up: At [facility], you and your health needs are our priority.  As part of our continuing mission to provide you with exceptional heart care, we have created designated Provider Care Teams.  These Care Teams include your primary Cardiologist (physician) and Advanced Practice Providers (APPs -  Physician Assistants and Nurse Practitioners) who all work together to provide you with the care you need, when you need it.  We recommend signing up for the patient portal called "MyChart".  Sign up information is provided on this After Visit Summary.  MyChart is used to connect with patients for Virtual Visits (Telemedicine).  Patients are able to view lab/test results, encounter notes, upcoming appointments, etc.  Non-urgent messages can be sent to your provider as well.   To learn more about what you can do with MyChart, go to [email or web address].    Your next appointment:   2-3 month(s)  The format for your next appointment:   In Person  Provider:   [name], MD   Other Instructions  ZIO XT- Long Term Monitor Instructions  Your physician has requested you wear a ZIO patch monitor for 14 days.  This is a single patch monitor. [employer] supplies one patch monitor per enrollment. Additional stickers are not available. Please do not apply patch if you will be having a Nuclear Stress Test,   Echocardiogram, Cardiac CT, MRI, or Chest Xray during the period you would be wearing the  monitor. The patch cannot be worn during these tests. You cannot remove and re-apply the  ZIO XT patch monitor.  Your ZIO patch monitor will be mailed 3 day USPS to your address on file. It may take 3-5 days  to receive your monitor after you have been enrolled.  Once you have received your monitor, please review the enclosed instructions. Your monitor  has already been registered assigning a specific monitor serial # to you.  Billing and Patient Assistance Program Information  We have supplied [employer] with any of your insurance information on file for billing purposes. [employer] offers a sliding scale Patient Assistance Program for patients that do not have  insurance, or whose insurance does not completely cover the cost of the ZIO monitor.  You must apply for the Patient Assistance Program to qualify for this discounted rate.  To apply, please call [employer] at [phone], select option 4, select option 2, ask to apply for  Patient Assistance Program. Theodore Demark will ask your household income, and how many people  are in your household. They will quote your out-of-pocket cost based on that information.  [employer] will also be able to set up a 75-month interest-free payment plan if needed.  Applying the monitor   Shave hair from upper left chest.  Hold abrader disc by orange tab. Rub abrader in 40 strokes over the upper left chest as  indicated in your monitor  instructions.  Clean area with 4 enclosed alcohol pads. Let dry.  Apply patch as indicated in monitor instructions. Patch will be placed under collarbone on left  side of chest with arrow pointing upward.  Rub patch adhesive wings for 2 minutes. Remove white label marked "1". Remove the white  label marked "2". Rub patch adhesive wings for 2 additional minutes.  While looking in a mirror, press and release button in center of patch. A small  green light will  flash 3-4 times. This will be your only indicator that the monitor has been turned on.  Do not shower for the first 24 hours. You may shower after the first 24 hours.  Press the button if you feel a symptom. You will hear a small click. Record Date, Time and  Symptom in the Patient Logbook.  When you are ready to remove the patch, follow instructions on the last 2 pages of Patient  Logbook. Stick patch monitor onto the last page of Patient Logbook.  Place Patient Logbook in the blue and white box. Use locking tab on box and tape box closed  securely. The blue and white box has prepaid postage on it. Please place it in the mailbox as  soon as possible. Your physician should have your test results approximately 7 days after the  monitor has been mailed back to [facility].  Call [facility] at [phone] if you have questions regarding  your ZIO XT patch monitor. Call them immediately if you see an orange light blinking on your  monitor.  If your monitor falls off in less than 4 days, contact our Monitor department at [phone].  If your monitor becomes loose or falls off after 4 days call [employer] at [phone] for  suggestions on securing your monitor

## 2022-06-07 NOTE — ED Notes (Signed)
Patient transported to X-ray 

## 2022-06-07 NOTE — ED Notes (Signed)
Patient wakens to voice, says that he was eating roast outside and that's all he remembers before passing out. Says that hip is now hurting 9/10. MD notified

## 2022-06-07 NOTE — ED Notes (Signed)
Pt resting, respiratory rate even and unlabored. Pt still only awakened by painful stimuli

## 2022-06-07 NOTE — ED Notes (Signed)
Patient back from  X-ray 

## 2022-06-07 NOTE — Progress Notes (Signed)
Cardiology Office Note:    Date:  06/07/2022   ID:  DEANDREW HOECKER, DOB 01-17-1966, MRN [id number]  PCP:  [facility], [place] Providers Cardiologist:  [name], MD     Referring MD: No ref. provider found   No chief complaint on file.   History of Present Illness:    Eugene Diaz is a 56 y.o. male with a hx of diabetes, hypertension, hyperlipidemia, tobacco abuse, alcohol abuse, PAD s/p left BKA and right transmetatarsal amputation here for follow up. He was seen in the hospital 02/2022 with chest pain. Coronary CTA revealed mild disease in the LAD, LCX, and RCA. He was seen in the ED yesterday due to an unresponsive episode. He was found down on his porch and there was some concern for narcotic overdose, however in the ED he did not have any opioids on his tox screen. He was positive for THC. He was negative for alcohol. Head CT was unremarkable other than chronic microvascular disease.   Today, he is accompanied by his sister-in-law. We reviewed his hospitalization yesterday. At that time, he believes that his sugar levels dropped. He was sitting on the porch all day long. He generally only eats about once daily, which he believes could have contributed to his episode yesterday. He reports that his friend saw him slumped over and brought him some crackers. He states that it felt like a part of the cracker became stuck in his throat. His friend who was with him reported that he had difficulty speaking and did not seem aware of his surroundings. He denies having seizures before but recounts that his chest was "feeling weird" during his episode. He states he had one alcoholic drink last night, and that he used to drink a lot but has significantly cut back since the onset of some of his diseases. Ever since he left the hospital, he says he has not been able to use his right arm very well. His right arm feels weak, and he has difficulty ambulating his fingers. He  notes being unable to open a bottle of water.  He reports he has had trouble with that arm since he had cellulitis in his right foot. In the last week, he has not had any lightheadedness, although he has in the past while standing up, which he believes is a result of his blood pressure dropping. He is getting exercise through physical therapy on his right leg, which is in a boot. He spends time walking around the yard for exercise, and states that he sometimes has to stop to sit down, especially with his right leg injury. Currently he smokes about 1 ppd and two cigars. He restarted smoking about 6 months ago, most likely due to boredom. He had last quit smoking when he was 56 yo. He is willing to try to work on quitting. He denies any shortness of breath, or peripheral edema. No headaches, orthopnea, or PND.   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[place]) 05/23/2022   Depression    Diabetic neuropathy ([place])    Edema of both lower extremities    Essential tremor    neurologist-- dr patel--- due to alcohol abuse  GERD (gastroesophageal reflux disease)    History of cellulitis 2020   left lower leg;   recurrent 03-25-2020   History of esophageal stricture    s/p  dilatation 02-13-2018   History of osteomyelitis 2020   left great toe     Hypercholesterolemia    Hypertension    followed by pcp  (nuclear stress test 03-11-2014 low risk w/ no ischemia, ef 65%   Normocytic anemia    followed by pcp   (03-18-2020 had transfusion's 02/ 2021)   Osteomyelitis of great toe of right foot ([place]) 08/18/2021   Peripheral vascular disease ([place])    Severe depression ([place]) 06/09/2021   Status post incision and drainage followed by Dr [name] and pcp   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    (03-17-2020  pt states most of incision is healed with exception an area that is still draining,  daily dressing change),  foot is red but not warm to the touch and has swelling but has improved)   Suicidal ideation 06/09/2021   Syncope and collapse 06/07/2022   Thrombocytopenia ([place])    chronic   Type 2 diabetes mellitus ([place])    followed by pcp---    (03-18-2020  pt stated does not check blood sugar)    Past Surgical History:  Procedure Laterality Date   AMPUTATION Left 10/16/2019   Procedure: Left partial second ray resection; placement of antibiotic beads;  Surgeon: Evelina Bucy, DPM;  [facility];  Service: Podiatry;  Laterality: Left;   AMPUTATION Left 11/13/2019   Procedure: Left Midfoot Amputation - Transmetatarsal vs. Lisfranc; Placement antibiotic beads;  Surgeon: Evelina Bucy, DPM;  [facility];  Service: Podiatry;  Laterality: Left;   AMPUTATION Left 12/21/2019   Procedure: Chopart Amputation left foot;  Surgeon: Evelina Bucy, DPM;  Location: [place];  Service: Podiatry;  Laterality: Left;   AMPUTATION Left 06/24/2020   Procedure: LEFT BELOW KNEE AMPUTATION;  Surgeon: Newt Minion, MD;  Location: [place];  Service: Orthopedics;  Laterality: Left;   AMPUTATION Right 03/29/2021   Procedure: AMPUTATION RAY;  Surgeon: Evelina Bucy, DPM;  Location: [place];  Service: Podiatry;  Laterality: Right;   AMPUTATION TOE Left 08/14/2019   Procedure: AMPUTATION GREAT TOE;  Surgeon: Evelina Bucy, DPM;  [facility];  Service: Podiatry;  Laterality: Left;   APPLICATION OF WOUND VAC Left 12/21/2019   Procedure: Application Of Wound Vac;  Surgeon: Evelina Bucy, DPM;  Location: [place];  Service: Podiatry;  Laterality: Left;   BIOPSY  02/13/2018   Procedure: BIOPSY;  Surgeon: Danie Binder, MD;  [facility];  Service: Endoscopy;;  transverse colon biopsy, gastric biopsy   COLONOSCOPY WITH PROPOFOL N/A 02/13/2018   Procedure: COLONOSCOPY WITH PROPOFOL;   Surgeon: Danie Binder, MD;  [facility];  Service: Endoscopy;  Laterality: N/A;  11:15am   ESOPHAGOGASTRODUODENOSCOPY (EGD) WITH PROPOFOL N/A 02/13/2018   Procedure: ESOPHAGOGASTRODUODENOSCOPY (EGD) WITH PROPOFOL;  Surgeon: Danie Binder, MD;  [facility];  Service: Endoscopy;  Laterality: N/A;   I & D EXTREMITY Left 07/16/2019   Procedure: Insicion  AND DEBRIDEMENT EXTREMITY;  Surgeon: Evelina Bucy, DPM;  Location: [place];  Service: Podiatry;  Laterality: Left;   I & D EXTREMITY Left 06/22/2020   Procedure: IRRIGATION AND DEBRIDEMENT EXTREMITY, left foot and ankle;  Surgeon: Evelina Bucy, DPM;  Location: [place];  Service: Podiatry;  Laterality: Left;   I &  D EXTREMITY Right 03/29/2021   Procedure: IRRIGATION AND DEBRIDEMENT EXTREMITY;  Surgeon: Evelina Bucy, DPM;  Location: [place];  Service: Podiatry;  Laterality: Right;   INCISION AND DRAINAGE OF WOUND Left 10/13/2019   Procedure: IRRIGATION AND DEBRIDEMENT WOUND OF LEFT FOOT AND FIRST METATARSAL RESECTION;  Surgeon: Evelina Bucy, DPM;  [facility];  Service: Podiatry;  Laterality: Left;   IRRIGATION AND DEBRIDEMENT FOOT Left 11/11/2019   Procedure: Left Foot Wound Irrigation and Debridement;  Surgeon: Evelina Bucy, DPM;  [facility];  Service: Podiatry;  Laterality: Left;   Left arm     Left arm repair (tendon and artery)   PERCUTANEOUS PINNING  07/16/2019   Procedure: Open Reduction Percutaneous Pinning Extremity;  Surgeon: Evelina Bucy, DPM;  Location: [place];  Service: Podiatry;;   PILONIDAL CYST EXCISION N/A 08/08/2014   Procedure: CYST EXCISION PILONIDAL EXTENSIVE;  Surgeon: Jamesetta So, MD;  [facility];  Service: General;  Laterality: N/A;   POLYPECTOMY  02/13/2018   Procedure: POLYPECTOMY;  Surgeon: Danie Binder, MD;  [facility];  Service: Endoscopy;;  transverse colon polyp hs, rectal polyps times 2   SAVORY DILATION N/A 02/13/2018   Procedure: SAVORY  DILATION;  Surgeon: Danie Binder, MD;  [facility];  Service: Endoscopy;  Laterality: N/A;   TENDON LENGTHENING Right 12/04/2021   Procedure: TENDON LENGTHENING;  Surgeon: Landis Martins, DPM;  Location: [place];  Service: Podiatry;  Laterality: Right;   TRANSMETATARSAL AMPUTATION Right 12/04/2021   Procedure: TRANSMETATARSAL AMPUTATION;  Surgeon: Landis Martins, DPM;  Location: [place];  Service: Podiatry;  Laterality: Right;   WOUND DEBRIDEMENT Left 09/04/2019   Procedure: DEBRIDEMENT WOUND WITH COMPLEX  REPAIR OF DEHISCENCE;  Surgeon: Evelina Bucy, DPM;  Location: [place];  Service: Podiatry;  Laterality: Left;  Leave patient on stretcher   WOUND DEBRIDEMENT Left 10/16/2019   Procedure: Left foot wound debridement and closure;  Surgeon: Evelina Bucy, DPM;  [facility];  Service: Podiatry;  Laterality: Left;   WOUND DEBRIDEMENT Left 12/18/2019   Procedure: LEFT FOOT DEBRIDEMENT WITH PARTIAL INCISION OF INFECTED BONE;  Surgeon: Evelina Bucy, DPM;  Location: [place];  Service: Podiatry;  Laterality: Left;  LEFT FOOT DEBRIDEMENT WITH PARTIAL INCISION OF INFECTED BONE   WOUND DEBRIDEMENT Right 04/01/2021   Procedure: Right foot wound debridement and irrigation and closure, bone biopsy;  Surgeon: Evelina Bucy, DPM;  Location: [place];  Service: Podiatry;  Laterality: Right;    Current Medications: Current Meds  Medication Sig   acetaminophen (TYLENOL) 325 MG tablet Take 2 tablets (650 mg total) by mouth every 6 (six) hours as needed for mild pain.   albuterol (VENTOLIN HFA) 108 (90 Base) MCG/ACT inhaler Inhale 1-2 puffs into the lungs every 4 (four) hours as needed for wheezing or shortness of breath.   ALPRAZolam (XANAX) 1 MG tablet Take 1 mg by mouth 4 (four) times daily.   amLODipine (NORVASC) 10 MG tablet Take 10 mg by mouth daily.   aspirin EC 81 MG tablet Take 81 mg by mouth daily. Swallow whole.   diclofenac Sodium (VOLTAREN) 1 % GEL Apply 4 g  topically 4 (four) times daily as needed (Apply to painful area over left upper front chest.).   doxycycline (VIBRA-TABS) 100 MG tablet Take 1 tablet (100 mg total) by mouth every 12 (twelve) hours.   ertapenem (INVANZ) IVPB Inject 1 g into the vein daily. Indication:  Osteomyelitis First Dose: No Last Day of Therapy:  06/20/22 Labs - Once weekly:  CBC/D and BMP, Labs - Every other week:  ESR and CRP Method of administration: Mini-Bag Plus / Gravity Method of administration may be changed at the discretion of home infusion pharmacist based upon assessment of the patient and/or caregiver's ability to self-administer the medication ordered.   folic acid (FOLVITE) 1 MG tablet Take 1 tablet (1 mg total) by mouth daily.   metFORMIN (GLUCOPHAGE) 500 MG tablet Take 500 mg by mouth 2 (two) times daily.   morphine (MS CONTIN) 30 MG 12 hr tablet Take 30 mg by mouth every 12 (twelve) hours.   Multiple Vitamin (MULTIVITAMIN WITH MINERALS) TABS tablet Take 1 tablet by mouth daily.   oxycodone (ROXICODONE) 30 MG immediate release tablet Take 30 mg by mouth 4 (four) times daily as needed.   rosuvastatin (CRESTOR) 5 MG tablet Take 1 tablet (5 mg total) by mouth daily.   thiamine 100 MG tablet Take 1 tablet (100 mg total) by mouth daily.     Allergies:   Patient has no known allergies.   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  Tobacco Use   Smoking status: Former    Packs/day: 2.50    Years: 25.00    Total pack years: 62.50    Types: Cigarettes    Quit date: 08/05/1984    Years since quitting: 37.8   Smokeless tobacco: Never  Vaping Use   Vaping Use: Never used  Substance and Sexual Activity   Alcohol use: Not Currently    Alcohol/week: 50.0  standard drinks of alcohol    Types: 50 Standard drinks or equivalent per week    Comment: Drinks 6 beers, liquor 4-6oz (both daily) x 35 years   Drug use: Not Currently    Types: Marijuana   Sexual activity: Yes    Birth control/protection: None  Other Topics Concern   Not on file  Social History Narrative   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 Highest level of education:  8th grade   He lives with wife.  No children.   Left handed   Drinks soda, water-no coffee, no tea   Social Determinants of Health   Financial Resource Strain: Not on file  Food Insecurity: Not on file  Transportation Needs: Not on file  Physical Activity: Not on file  Stress: Not on file  Social Connections: Not on file     Family History: The patient's family history includes Cancer in his father; Healthy in his brother and sister; Heart attack in his mother. There is no history of Colon cancer or Colon polyps.  ROS:   Please see the history of present illness.    (+) Near syncope (+) Chest discomfort  (+) Right arm/hand weakness  All other systems reviewed and are negative.  EKGs/Labs/Other Studies Reviewed:    The following studies were reviewed today:  Echo 03/03/2022: 1. Apex is hypokinetic. Left ventricular ejection fraction, by  estimation, is 50 to 55%. The left ventricle has low normal function.  There is moderate left ventricular hypertrophy. Left ventricular diastolic  parameters are consistent with Grade II  diastolic dysfunction (pseudonormalization).  2. Right ventricular systolic function is normal. The right ventricular  size is normal.   3. The mitral valve was not well visualized. No evidence of mitral valve  regurgitation.   4. There is mild calcification of the aortic valve. Aortic valve  regurgitation is not visualized.   5. The inferior vena cava is normal in size with greater than 50%  respiratory variability, suggesting right atrial  pressure of 3 mmHg.    CT Coronary Morph 03/02/2022: FINDINGS: Non-cardiac: See separate report from [facility].   Pulmonary veins drain normally to the left atrium. No LA appendage thrombus.   Calcium Score: 94 Agatston units.   Coronary Arteries: Right dominant with no anomalies   LM: No plaque or stenosis.   LAD system: Mixed plaque ostial LAD, mild (1-24%) stenosis. Large D1 with no significant disease.   Circumflex system: Mixed plaque mid LCx, mild (1-24%) stenosis.   RCA system: Mixed plaque proximal and mid RCA, mild (1-24%) stenosis.   IMPRESSION: 1. Coronary artery calcium score 94 Agatston units. This places the patient in the 74th percentile for age and gender, suggesting intermediate risk for future cardiac events.   2.  Nonobstructive mild CAD.     EKG: EKG is personally reviewed.  06/07/22: EKG was not ordered.    Recent Labs: 03/02/2022: TSH 0.570 05/22/2022: Magnesium 1.6 06/06/2022: ALT 20; BUN 12; Creatinine, Ser 0.87; Hemoglobin 14.5; Platelets 199; Potassium 3.4; Sodium 138  Recent Lipid Panel    Component Value Date/Time   CHOL 112 05/24/2022 0534   TRIG 84 05/24/2022 0534   HDL 25 (L) 05/24/2022 0534   CHOLHDL 4.5 05/24/2022 0534   VLDL 17 05/24/2022 0534   LDLCALC 70 05/24/2022 0534     Risk Assessment/Calculations:           Physical Exam:    VS:  BP 124/62 (BP Location: Left Arm, Patient Position: Sitting, Cuff Size: Normal)   Pulse 61   Ht 6' 2" (1.88 m)   Wt 245 lb 3.2 oz (111.2 kg)   SpO2 97%   BMI 31.48 kg/m     Wt Readings from Last 3 Encounters:  06/07/22 245 lb 3.2 oz (111.2 kg)  06/06/22 263 lb 0.1 oz (119.3 kg)  05/20/22 263 lb 0.1 oz (119.3 kg)    VS:  BP 124/62 (BP Location: Left Arm, Patient Position: Sitting, Cuff Size: Normal)   Pulse 61   Ht 6' 2" (1.88 m)   Wt 245 lb 3.2 oz (111.2 kg)   SpO2 97%   BMI 31.48 kg/m  , BMI Body mass index is 31.48 kg/m. GENERAL:  Well appearing.  No acute distress.   HEENT: Pupils equal round and reactive, fundi not visualized, oral mucosa unremarkable NECK:  No jugular venous distention, waveform within normal limits, carotid upstroke brisk and symmetric, no bruits, no thyromegaly LUNGS:  Clear to auscultation bilaterally HEART:  RRR.  PMI not displaced or sustained,S1 and S2 within normal limits, no S3, no S4, no clicks, no rubs, no murmurs ABD:  Flat, positive bowel sounds normal in frequency in pitch, no bruits, no rebound, no guarding, no midline pulsatile mass, no hepatomegaly, no splenomegaly EXT:  Left BKA s/p prosthesis, Right LE boot in place SKIN:  No rashes no nodules NEURO:  Cranial nerves II through XII grossly intact, motor grossly intact throughout PSYCH:  Cognitively intact, oriented to person place and time    ASSESSMENT:    1. Near syncope   2. Syncope and collapse  3. Tobacco abuse   4. CAD in native artery   5. Hyperlipidemia LDL goal <70   6. Therapeutic drug monitoring    PLAN:    In order of problems listed above:  Syncope and collapse He had an episode of near syncope while sitting on the porch.  He didn't actually pass out.  His systolic function is normal and he has minimal coronary disease.  It is unlikely that his caused his symptoms.  We will get a 14 day monitor to assess.  Labs were unremarkable.  He does have an EtOH history but isn't drinking heavily and had  A drink within 24 hours of the episode, so EtOH related seizure is unlikely.   Tobacco abuse He is currently smoking about a pack a day.  He thinks that this is mostly due to boredom.  Recommended that he consider patches or gum.  He is only been smoking for 6 months and thinks that he can quit on his own.  Also recommended that he work on getting a new hobby to occupy his time instead.  CAD in native artery Mild nonobstructive disease on coronary CTA.  He has no symptoms.  Will start aspirin 81 mg.  Will also start rosuvastatin 5 mg.  Repeat lipids and a  CMP in 2 to 3 months.     Follow up:  FU with Tiffany C. Oval Linsey, MD, [facility] in 2-3 months  Medication Adjustments/Labs and Tests Ordered: Current medicines are reviewed at length with the patient today.  Concerns regarding medicines are outlined above.  Orders Placed This Encounter  Procedures   Lipid panel   Comprehensive metabolic panel   LONG TERM MONITOR (3-14 DAYS)   Meds ordered this encounter  Medications   rosuvastatin (CRESTOR) 5 MG tablet    Sig: Take 1 tablet (5 mg total) by mouth daily.    Dispense:  90 tablet    Refill:  3    Patient Instructions  Medication Instructions:  START ROSUVASTATIN 5 MG DAILY   START ASPIRIN 81 MG DAILY   *If you need a refill on your cardiac medications before your next appointment, please call your pharmacy*  Lab Work: FASTING LP/CMET IN 2-3 MONTHS   If you have labs (blood work) drawn today and your tests are completely normal, you will receive your results only by: [place] (if you have MyChart) OR A paper copy in the mail If you have any lab test that is abnormal or we need to change your treatment, we will call you to review the results.  Testing/Procedures: 14 DAY ZIO MONITOR   Follow-Up: At [facility], you and your health needs are our priority.  As part of our continuing mission to provide you with exceptional heart care, we have created designated Provider Care Teams.  These Care Teams include your primary Cardiologist (physician) and Advanced Practice Providers (APPs -  Physician Assistants and Nurse Practitioners) who all work together to provide you with the care you need, when you need it.  We recommend signing up for the patient portal called "MyChart".  Sign up information is provided on this After Visit Summary.  MyChart is used to connect with patients for Virtual Visits (Telemedicine).  Patients are able to view lab/test results, encounter notes, upcoming appointments, etc.  Non-urgent messages can be  sent to your provider as well.   To learn more about what you can do with MyChart, go to [email or web address].    Your next appointment:   2-3  month(s)  The format for your next appointment:   In Person  Provider:   [name], MD   Other Instructions  Bryn Gulling- Long Term Monitor Instructions  Your physician has requested you wear a ZIO patch monitor for 14 days.  This is a single patch monitor. [employer] supplies one patch monitor per enrollment. Additional stickers are not available. Please do not apply patch if you will be having a Nuclear Stress Test,  Echocardiogram, Cardiac CT, MRI, or Chest Xray during the period you would be wearing the  monitor. The patch cannot be worn during these tests. You cannot remove and re-apply the  ZIO XT patch monitor.  Your ZIO patch monitor will be mailed 3 day USPS to your address on file. It may take 3-5 days  to receive your monitor after you have been enrolled.  Once you have received your monitor, please review the enclosed instructions. Your monitor  has already been registered assigning a specific monitor serial # to you.  Billing and Patient Assistance Program Information  We have supplied [employer] with any of your insurance information on file for billing purposes. [employer] offers a sliding scale Patient Assistance Program for patients that do not have  insurance, or whose insurance does not completely cover the cost of the ZIO monitor.  You must apply for the Patient Assistance Program to qualify for this discounted rate.  To apply, please call [employer] at [phone], select option 4, select option 2, ask to apply for  Patient Assistance Program. Theodore Demark will ask your household income, and how many people  are in your household. They will quote your out-of-pocket cost based on that information.  [employer] will also be able to set up a 63-month interest-free payment plan if needed.  Applying the monitor   Shave hair from upper  left chest.  Hold abrader disc by orange tab. Rub abrader in 40 strokes over the upper left chest as  indicated in your monitor instructions.  Clean area with 4 enclosed alcohol pads. Let dry.  Apply patch as indicated in monitor instructions. Patch will be placed under collarbone on left  side of chest with arrow pointing upward.  Rub patch adhesive wings for 2 minutes. Remove white label marked "1". Remove the white  label marked "2". Rub patch adhesive wings for 2 additional minutes.  While looking in a mirror, press and release button in center of patch. A small green light will  flash 3-4 times. This will be your only indicator that the monitor has been turned on.  Do not shower for the first 24 hours. You may shower after the first 24 hours.  Press the button if you feel a symptom. You will hear a small click. Record Date, Time and  Symptom in the Patient Logbook.  When you are ready to remove the patch, follow instructions on the last 2 pages of Patient  Logbook. Stick patch monitor onto the last page of Patient Logbook.  Place Patient Logbook in the blue and white box. Use locking tab on box and tape box closed  securely. The blue and white box has prepaid postage on it. Please place it in the mailbox as  soon as possible. Your physician should have your test results approximately 7 days after the  monitor has been [facility]  Call [facility] [phone] you have questions regarding  your ZIO XT patch monitor. Call them immediately if you see an orange light blinking  on your  monitor.  If your monitor falls off in less than 4 days, contact our Monitor department at [phone].  If your monitor becomes loose or falls off after 4 days call [employer] at [phone] for  suggestions on securing your monitor       I,Breanna Adamick,acting as a scribe for [name], MD.,have documented all relevant documentation on the behalf of  [name], MD,as directed by  [name], MD while in the presence of [name], MD.   I, Burgess Oval Linsey, MD have reviewed all documentation for this visit.  The documentation of the exam, diagnosis, procedures, and orders on 06/07/2022 are all accurate and complete.   Signed, [name], MD  06/07/2022 9:34 AM    [place]

## 2022-06-07 NOTE — ED Notes (Signed)
Patient transported to CT 

## 2022-06-07 NOTE — Assessment & Plan Note (Signed)
He had an episode of near syncope while sitting on the porch.  He didn't actually pass out.  His systolic function is normal and he has minimal coronary disease.  It is unlikely that his caused his symptoms.  We will get a 14 day monitor to assess.  Labs were unremarkable.  He does have an EtOH history but isn't drinking heavily and had  A drink within 24 hours of the episode, so EtOH related seizure is unlikely.

## 2022-06-07 NOTE — Assessment & Plan Note (Signed)
He is currently smoking about a pack a day.  He thinks that this is mostly due to boredom.  Recommended that he consider patches or gum.  He is only been smoking for 6 months and thinks that he can quit on his own.  Also recommended that he work on getting a new hobby to occupy his time instead.

## 2022-06-07 NOTE — ED Provider Notes (Signed)
  Physical Exam  BP (!) 147/81   Pulse 65   Temp 98 F (36.7 C)   Resp 18   Ht '6\' 2"'$  (1.88 m)   Wt 119.3 kg   SpO2 92%   BMI 33.77 kg/m   Physical Exam Vitals and nursing note reviewed.  Constitutional:      General: He is not in acute distress.    Appearance: He is well-developed. He is not diaphoretic.  HENT:     Head: Normocephalic and atraumatic.  Cardiovascular:     Rate and Rhythm: Normal rate and regular rhythm.     Heart sounds: No murmur heard.    No friction rub.  Pulmonary:     Effort: Pulmonary effort is normal. No respiratory distress.     Breath sounds: Normal breath sounds. No wheezing or rales.  Abdominal:     General: Bowel sounds are normal. There is no distension.     Palpations: Abdomen is soft.     Tenderness: There is no abdominal tenderness.  Musculoskeletal:        General: Normal range of motion.     Cervical back: Normal range of motion and neck supple.  Skin:    General: Skin is warm and dry.  Neurological:     Mental Status: He is alert and oriented to person, place, and time.     Coordination: Coordination normal.     Procedures  Procedures  ED Course / MDM  Care assumed from Dr. Kathrynn Humble at shift change.  Patient arrived here after some sort of unresponsive episode, the etiology of which I am uncertain.  Patient does take large quantities of opioids at home, but his tox screen here was negative.  Care was signed out to me awaiting metabolism and reassessment.  Patient has been observed here overnight and is now awake and speaking and back to his baseline.  He was complaining of some pain in his right hip when he woke up.  An x-ray was obtained showing concern for possible acetabular fracture.  This was followed up with a CT scan which did not confirm this.  At this point, I feel as though patient can safely be discharged.  I suspect symptoms related to pain medication ingested at home.  There is no evidence for stroke in his work-up or  alternate diagnosis.       Veryl Speak, MD 06/07/22 [phone]

## 2022-06-08 ENCOUNTER — Ambulatory Visit (INDEPENDENT_AMBULATORY_CARE_PROVIDER_SITE_OTHER): Payer: Medicare Other | Admitting: Podiatry

## 2022-06-08 DIAGNOSIS — Z794 Long term (current) use of insulin: Secondary | ICD-10-CM

## 2022-06-08 DIAGNOSIS — E1111 Type 2 diabetes mellitus with ketoacidosis with coma: Secondary | ICD-10-CM | POA: Diagnosis not present

## 2022-06-08 DIAGNOSIS — T148XXA Other injury of unspecified body region, initial encounter: Secondary | ICD-10-CM | POA: Diagnosis not present

## 2022-06-08 DIAGNOSIS — L089 Local infection of the skin and subcutaneous tissue, unspecified: Secondary | ICD-10-CM | POA: Diagnosis not present

## 2022-06-08 DIAGNOSIS — L97512 Non-pressure chronic ulcer of other part of right foot with fat layer exposed: Secondary | ICD-10-CM

## 2022-06-08 NOTE — Progress Notes (Signed)
Subjective:  Patient ID: Eugene Diaz, male    DOB: 12/10/1965,  MRN: [id number]  Chief Complaint  Patient presents with   Wound Check    56 y.o. male presents for wound care.  Patient presents with right transmetatarsal amputation stump wound.  He states that it came out of nowhere.  His wound was doing good.  Now it started with some pressure and is hurting/open up the wound.  He wanted get it checked out he has not seen anyone else prior to seeing me.  He denies any other acute complaints.   Review of Systems: Negative except as noted in the HPI. Denies N/V/F/Ch.  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) 05/23/2022   Depression    Diabetic neuropathy (HCC)    Edema of both lower extremities    Essential tremor    neurologist-- dr patel--- due to alcohol abuse   GERD (gastroesophageal reflux disease)    History of cellulitis 2020   left lower leg;   recurrent 03-25-2020   History of esophageal stricture    s/p  dilatation 02-13-2018   History of osteomyelitis 2020   left great toe     Hypercholesterolemia    Hypertension    followed by pcp  (nuclear stress test 03-11-2014 low risk w/ no ischemia, ef 65%   Normocytic anemia    followed by pcp   (03-18-2020 had transfusion's 02/ 2021)   Osteomyelitis of great toe of right foot (HCC) 08/18/2021   Peripheral vascular disease (HCC)    Severe depression (HCC) 06/09/2021   Status post incision and drainage followed by Dr Price and pcp   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   (03-17-2020  pt states most of incision is healed with exception an area that is still draining,  daily dressing change),  foot is red but not warm to the touch and has swelling but has improved)   Suicidal ideation 06/09/2021   Syncope  and collapse 06/07/2022   Thrombocytopenia (HCC)    chronic   Type 2 diabetes mellitus (HCC)    followed by pcp---    (03-18-2020  pt stated does not check blood sugar)    Current Outpatient Medications:    acetaminophen (TYLENOL) 325 MG tablet, Take 2 tablets (650 mg total) by mouth every 6 (six) hours as needed for mild pain., Disp: , Rfl:    albuterol (VENTOLIN HFA) 108 (90 Base) MCG/ACT inhaler, Inhale 1-2 puffs into the lungs every 4 (four) hours as needed for wheezing or shortness of breath., Disp: , Rfl:    ALPRAZolam (XANAX) 1 MG tablet, Take 1 mg by mouth 4 (four) times daily., Disp: , Rfl:    amLODipine (NORVASC) 10 MG tablet, Take 10 mg by mouth daily., Disp: , Rfl:    aspirin EC 81 MG tablet, Take 81 mg by mouth daily. Swallow whole., Disp: , Rfl:    diclofenac Sodium (VOLTAREN) 1 % GEL, Apply 4 g topically 4 (four) times daily as needed (Apply to painful area over left upper front chest.)., Disp: 50 g, Rfl: 0   doxycycline (VIBRA-TABS) 100 MG tablet, Take 1 tablet (100 mg total) by mouth every 12 (twelve) hours., Disp: 74 tablet, Rfl: 0     ertapenem (INVANZ) IVPB, Inject 1 g into the vein daily. Indication:  Osteomyelitis First Dose: No Last Day of Therapy:  06/20/22 Labs - Once weekly:  CBC/D and BMP, Labs - Every other week:  ESR and CRP Method of administration: Mini-Bag Plus / Gravity Method of administration may be changed at the discretion of home infusion pharmacist based upon assessment of the patient and/or caregiver's ability to self-administer the medication ordered., Disp: 38 Units, Rfl: 0   folic acid (FOLVITE) 1 MG tablet, Take 1 tablet (1 mg total) by mouth daily., Disp: 30 tablet, Rfl: 0   metFORMIN (GLUCOPHAGE) 500 MG tablet, Take 500 mg by mouth 2 (two) times daily., Disp: , Rfl:    morphine (MS CONTIN) 30 MG 12 hr tablet, Take 30 mg by mouth every 12 (twelve) hours., Disp: , Rfl:    Multiple Vitamin (MULTIVITAMIN WITH MINERALS) TABS tablet, Take 1 tablet by mouth  daily., Disp: , Rfl:    oxycodone (ROXICODONE) 30 MG immediate release tablet, Take 30 mg by mouth 4 (four) times daily as needed., Disp: , Rfl:    rosuvastatin (CRESTOR) 5 MG tablet, Take 1 tablet (5 mg total) by mouth daily., Disp: 90 tablet, Rfl: 3   thiamine 100 MG tablet, Take 1 tablet (100 mg total) by mouth daily., Disp: 30 tablet, Rfl: 0  Social History   Tobacco Use  Smoking Status Former   Packs/day: 2.50   Years: 25.00   Total pack years: 62.50   Types: Cigarettes   Quit date: 08/05/1984   Years since quitting: 37.8  Smokeless Tobacco Never    No Known Allergies Objective:  There were no vitals filed for this visit. There is no height or weight on file to calculate BMI. Constitutional Well developed. Well nourished.  Vascular Dorsalis pedis pulses palpable bilaterally. Posterior tibial pulses palpable bilaterally. Capillary refill normal to all digits.  No cyanosis or clubbing noted. Pedal hair growth normal.  Neurologic Normal speech. Oriented to person, place, and time. Protective sensation absent  Dermatologic Wound Location: Right transmetatarsal amputation site stump wound with fat layer exposed.  No purulent drainage noted does not probe down to bone. Wound Base: Mixed Granular/Fibrotic Peri-wound: Calloused Exudate: Scant/small amount Serosanguinous exudate Wound Measurements: -See above  Orthopedic: No pain to palpation either foot.   Radiographs: None Assessment:   1. Ulcer of right foot with fat layer exposed ([place])   2. Type 2 diabetes mellitus with ketoacidosis and coma, with long-term current use of insulin ([place])    Plan:  Patient was evaluated and treated and all questions answered.  Ulcer right transmetatarsal amputation stump wound with fat layer exposed -Debridement as below. -Dressed with Betadine wet-to-dry, DSD. -Continue off-loading with cam boot  Procedure: Excisional Debridement of Wound Tool: Sharp chisel blade/tissue  nipper Rationale: Removal of non-viable soft tissue from the wound to promote healing.  Anesthesia: none Pre-Debridement Wound Measurements: 1 cm x 0.5 cm x 0.4 cm  Post-Debridement Wound Measurements: 1.2 cm x 0.6 cm x 0.4 cm  Type of Debridement: Sharp Excisional Tissue Removed: Non-viable soft tissue Blood loss: Minimal (<50cc) Depth of Debridement: subcutaneous tissue. Technique: Sharp excisional debridement to bleeding, viable wound base.  Wound Progress: This is my initial evaluation of continue monitor the progression of the wound Site healing conversation 7 Dressing: Dry, sterile, compression dressing. Disposition: Patient tolerated procedure well. Patient to return in 1 week for follow-up.  No follow-ups on file.

## 2022-06-13 ENCOUNTER — Telehealth (HOSPITAL_BASED_OUTPATIENT_CLINIC_OR_DEPARTMENT_OTHER): Payer: Self-pay

## 2022-06-13 ENCOUNTER — Encounter: Payer: Self-pay | Admitting: Infectious Disease

## 2022-06-13 DIAGNOSIS — L89893 Pressure ulcer of other site, stage 3: Secondary | ICD-10-CM | POA: Diagnosis not present

## 2022-06-13 DIAGNOSIS — E1142 Type 2 diabetes mellitus with diabetic polyneuropathy: Secondary | ICD-10-CM | POA: Diagnosis not present

## 2022-06-13 DIAGNOSIS — D696 Thrombocytopenia, unspecified: Secondary | ICD-10-CM | POA: Diagnosis not present

## 2022-06-13 DIAGNOSIS — Z792 Long term (current) use of antibiotics: Secondary | ICD-10-CM | POA: Diagnosis not present

## 2022-06-13 DIAGNOSIS — F32A Depression, unspecified: Secondary | ICD-10-CM | POA: Diagnosis not present

## 2022-06-13 DIAGNOSIS — E1151 Type 2 diabetes mellitus with diabetic peripheral angiopathy without gangrene: Secondary | ICD-10-CM | POA: Diagnosis not present

## 2022-06-13 DIAGNOSIS — Z9181 History of falling: Secondary | ICD-10-CM | POA: Diagnosis not present

## 2022-06-13 DIAGNOSIS — E1159 Type 2 diabetes mellitus with other circulatory complications: Secondary | ICD-10-CM | POA: Diagnosis not present

## 2022-06-13 DIAGNOSIS — E1169 Type 2 diabetes mellitus with other specified complication: Secondary | ICD-10-CM | POA: Diagnosis not present

## 2022-06-13 DIAGNOSIS — B965 Pseudomonas (aeruginosa) (mallei) (pseudomallei) as the cause of diseases classified elsewhere: Secondary | ICD-10-CM | POA: Diagnosis not present

## 2022-06-13 DIAGNOSIS — K219 Gastro-esophageal reflux disease without esophagitis: Secondary | ICD-10-CM | POA: Diagnosis not present

## 2022-06-13 DIAGNOSIS — I10 Essential (primary) hypertension: Secondary | ICD-10-CM | POA: Diagnosis not present

## 2022-06-13 DIAGNOSIS — Z89512 Acquired absence of left leg below knee: Secondary | ICD-10-CM | POA: Diagnosis not present

## 2022-06-13 DIAGNOSIS — G894 Chronic pain syndrome: Secondary | ICD-10-CM | POA: Diagnosis not present

## 2022-06-13 DIAGNOSIS — E538 Deficiency of other specified B group vitamins: Secondary | ICD-10-CM | POA: Diagnosis not present

## 2022-06-13 DIAGNOSIS — Z87891 Personal history of nicotine dependence: Secondary | ICD-10-CM | POA: Diagnosis not present

## 2022-06-13 DIAGNOSIS — T8743 Infection of amputation stump, right lower extremity: Secondary | ICD-10-CM | POA: Diagnosis not present

## 2022-06-13 DIAGNOSIS — J45909 Unspecified asthma, uncomplicated: Secondary | ICD-10-CM | POA: Diagnosis not present

## 2022-06-13 DIAGNOSIS — Z7984 Long term (current) use of oral hypoglycemic drugs: Secondary | ICD-10-CM | POA: Diagnosis not present

## 2022-06-13 DIAGNOSIS — M86371 Chronic multifocal osteomyelitis, right ankle and foot: Secondary | ICD-10-CM | POA: Diagnosis not present

## 2022-06-13 DIAGNOSIS — Z89431 Acquired absence of right foot: Secondary | ICD-10-CM | POA: Diagnosis not present

## 2022-06-13 DIAGNOSIS — E7800 Pure hypercholesterolemia, unspecified: Secondary | ICD-10-CM | POA: Diagnosis not present

## 2022-06-13 NOTE — Telephone Encounter (Signed)
Received email from [employer]:  Hello,  This email is to notify you that the patient listed below had an issue where the patch started flashing rapidly. We have placed an order for a replacement device to be overnighted to the patient and placed a hold on the billing, so the patient is not charged for the first device. If you have any questions, please respond to this email, or call us at [phone].   Patient initials: B.B  Patient ID: [id number]  SN: [id number]  Ticket: [id number]   Account: [facility]    Thank you,  Lester Kinsman Customer Care

## 2022-06-18 DIAGNOSIS — R55 Syncope and collapse: Secondary | ICD-10-CM | POA: Diagnosis not present

## 2022-06-19 DIAGNOSIS — M8638 Chronic multifocal osteomyelitis, other site: Secondary | ICD-10-CM | POA: Diagnosis not present

## 2022-06-19 DIAGNOSIS — Z452 Encounter for adjustment and management of vascular access device: Secondary | ICD-10-CM | POA: Diagnosis not present

## 2022-06-19 DIAGNOSIS — M86371 Chronic multifocal osteomyelitis, right ankle and foot: Secondary | ICD-10-CM | POA: Diagnosis not present

## 2022-06-20 ENCOUNTER — Other Ambulatory Visit: Payer: Self-pay

## 2022-06-20 ENCOUNTER — Ambulatory Visit (INDEPENDENT_AMBULATORY_CARE_PROVIDER_SITE_OTHER): Payer: Medicare Other | Admitting: Infectious Disease

## 2022-06-20 ENCOUNTER — Encounter: Payer: Self-pay | Admitting: Infectious Disease

## 2022-06-20 VITALS — BP 148/81 | HR 64 | Resp 16 | Ht 74.0 in | Wt 245.0 lb

## 2022-06-20 DIAGNOSIS — F1010 Alcohol abuse, uncomplicated: Secondary | ICD-10-CM

## 2022-06-20 DIAGNOSIS — G621 Alcoholic polyneuropathy: Secondary | ICD-10-CM

## 2022-06-20 DIAGNOSIS — M86371 Chronic multifocal osteomyelitis, right ankle and foot: Secondary | ICD-10-CM

## 2022-06-20 DIAGNOSIS — E1169 Type 2 diabetes mellitus with other specified complication: Secondary | ICD-10-CM

## 2022-06-20 DIAGNOSIS — Z794 Long term (current) use of insulin: Secondary | ICD-10-CM

## 2022-06-20 DIAGNOSIS — E1142 Type 2 diabetes mellitus with diabetic polyneuropathy: Secondary | ICD-10-CM | POA: Diagnosis not present

## 2022-06-20 DIAGNOSIS — L089 Local infection of the skin and subcutaneous tissue, unspecified: Secondary | ICD-10-CM

## 2022-06-20 DIAGNOSIS — R55 Syncope and collapse: Secondary | ICD-10-CM | POA: Diagnosis not present

## 2022-06-20 HISTORY — DX: Syncope and collapse: R55

## 2022-06-20 MED ORDER — CEFDINIR 300 MG PO CAPS: 300.0000 mg | ORAL_CAPSULE | Freq: Two times a day (BID) | ORAL | 5 refills | Status: DC

## 2022-06-20 MED ORDER — DOXYCYCLINE HYCLATE 100 MG PO TABS: 100.0000 mg | ORAL_TABLET | Freq: Two times a day (BID) | ORAL | 5 refills | Status: DC

## 2022-06-20 NOTE — Progress Notes (Signed)
Subjective:  Chief complaint follow-up for diabetic foot ulcer with osteomyelitis  Patient ID: Eugene Diaz, male    DOB: 04-14-1966, 56 y.o.   MRN: [id number]  HPI  Eugene Diaz is a 56 y.o. male alcoholic neuropathy diabetes mellitus and osteomyelitis who is well-known to me from my having seen him as an inpatient and followed him in our clinic.  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  He had additional further debridement as well with bone biopsy of the first metatarsal and wound closure.  He grew E. Coli group B streptococcus as well as beta-lactamase producing Prevotella.  He was narrowed to intravenous cefazolin with oral metronidazole and discharged home with this with his sister helping him with antibiotics.  We switched him over the IV therapy to Augmentin and he was followed by [name] by podiatry.  He had some significant problems with depression and passive suicidal ideation and related to the loss of his wife many years ago.  He improved significantly emotionally and actually had reduced his alcohol intake drastically.  He is not completely clean but his intake is dramatically less than in the past when he drank on a daily basis.  Followed him closely and saw him last in November 2022.   He came off his antibiotics and he came back and was seen by my partner [name] and seem to have no evidence of active infection at that point in time.   Unfortunately interim he developed worsening gangrenous changes involving the third toe and underwent transmetatarsal amputation with [name] on December 04, 2021.  Apparently approxi a month ago his foot got caught on a nail that was sticking out of his  porch.  He developed intermittent redness and swelling that worsened recently and was convinced by family come to the emergency department with worsening flexion of his feet.  His foot was warm and swollen and erythematous.  MRI was performed at [facility] which showed a new soft tissue ulcer in the medial forefoot distal to the coronal and medial to the first metatarsal amputation stump.  There is edema and swelling of the ankle and dorsal midfoot and there is marrow edema within the third and fourth and remaining fifth metatarsal shafts as well as some mild edema in the distal aspect the remaining first and second metatarsal shafts all concerning for osteomyelitis.   Patient was seen by general surgery at [facility] he did not want to intervene surgically.  They wanted to try antibiotics.  I was very skeptical that antibiotics were going to get [place] very far but I i ertapenem with doxycycline June 30, 2022.  In the interim he was hospitalized with an episode of unresponsiveness that was thought to be due to too many sedating medications from home though he is seen [name] with cardiology who seems to think the patient had a near syncopal event rather than a drug related overdose.  Patient has had a monitor on since then.  He is also seen Dr. [name] for his foot but it seems that Dr. [name] is not aware (based on notes) of the patient's recent hospital admission or MRI.  His PICC line remains in place he has been dressing his wound  but using a too sticky adhesive.    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[place]) 05/23/2022   Depression    Diabetic neuropathy ([place])    Edema of both lower extremities    Essential tremor    neurologist-- dr patel--- due to alcohol abuse    GERD (gastroesophageal reflux disease)    History of cellulitis 2020   left lower leg;   recurrent 03-25-2020   History of esophageal stricture    s/p  dilatation 02-13-2018   History of osteomyelitis 2020   left great toe     Hypercholesterolemia    Hypertension    followed by pcp  (nuclear stress test 03-11-2014 low risk w/ no ischemia, ef 65%   Normocytic anemia    followed by pcp   (03-18-2020 had transfusion's 02/ 2021)   Osteomyelitis of great toe of right foot ([place]) 08/18/2021   Peripheral vascular disease ([place])    Severe depression ([place]) 06/09/2021   Status post incision and drainage followed by Dr [name] and pcp   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   (03-17-2020  pt states most of incision is healed with exception an area that is still draining,  daily dressing change),  foot is red but not warm to the touch and has swelling but has improved)   Suicidal ideation 06/09/2021   Syncope and collapse 06/07/2022   Thrombocytopenia ([place])    chronic   Type 2 diabetes mellitus ([place])    followed by pcp---    (03-18-2020  pt stated does not check blood sugar)    Past Surgical History:  Procedure Laterality Date   AMPUTATION Left 10/16/2019   Procedure: Left partial second ray resection; placement of antibiotic beads;  Surgeon: Evelina Bucy, DPM;  [facility]: [facility];  Service: Podiatry;  Laterality: Left;   AMPUTATION Left 11/13/2019   Procedure: Left Midfoot Amputation - Transmetatarsal vs. Lisfranc; Placement antibiotic beads;  Surgeon: Evelina Bucy, DPM;  [facility];  Service: Podiatry;  Laterality: Left;   AMPUTATION Left 12/21/2019   Procedure: Chopart Amputation left foot;  Surgeon: Evelina Bucy, DPM;  Location: [place];  Service: Podiatry;  Laterality: Left;   AMPUTATION Left 06/24/2020   Procedure: LEFT BELOW KNEE AMPUTATION;  Surgeon: Newt Minion, MD;  Location: [place];  Service: Orthopedics;  Laterality: Left;   AMPUTATION Right 03/29/2021   Procedure:  AMPUTATION RAY;  Surgeon: Evelina Bucy, DPM;  Location: [place];  Service: Podiatry;  Laterality: Right;   AMPUTATION TOE Left 08/14/2019   Procedure: AMPUTATION GREAT TOE;  Surgeon: Evelina Bucy, DPM;  [facility];  Service: Podiatry;  Laterality: Left;   APPLICATION OF WOUND VAC Left 12/21/2019   Procedure: Application Of Wound Vac;  Surgeon: Evelina Bucy, DPM;  Location: [place];  Service: Podiatry;  Laterality: Left;   BIOPSY  02/13/2018   Procedure: BIOPSY;  Surgeon: Danie Binder, MD;  [facility];  Service: Endoscopy;;  transverse colon biopsy, gastric biopsy   COLONOSCOPY WITH PROPOFOL N/A 02/13/2018   Procedure: COLONOSCOPY WITH PROPOFOL;  Surgeon: Danie Binder, MD;  [facility];  Service: Endoscopy;  Laterality: N/A;  11:15am   ESOPHAGOGASTRODUODENOSCOPY (  EGD) WITH PROPOFOL N/A 02/13/2018   Procedure: ESOPHAGOGASTRODUODENOSCOPY (EGD) WITH PROPOFOL;  Surgeon: Danie Binder, MD;  [facility];  Service: Endoscopy;  Laterality: N/A;   I & D EXTREMITY Left 07/16/2019   Procedure: Insicion  AND DEBRIDEMENT EXTREMITY;  Surgeon: Evelina Bucy, DPM;  Location: [place];  Service: Podiatry;  Laterality: Left;   I & D EXTREMITY Left 06/22/2020   Procedure: IRRIGATION AND DEBRIDEMENT EXTREMITY, left foot and ankle;  Surgeon: Evelina Bucy, DPM;  Location: [place];  Service: Podiatry;  Laterality: Left;   I & D EXTREMITY Right 03/29/2021   Procedure: IRRIGATION AND DEBRIDEMENT EXTREMITY;  Surgeon: Evelina Bucy, DPM;  Location: [place];  Service: Podiatry;  Laterality: Right;   INCISION AND DRAINAGE OF WOUND Left 10/13/2019   Procedure: IRRIGATION AND DEBRIDEMENT WOUND OF LEFT FOOT AND FIRST METATARSAL RESECTION;  Surgeon: Evelina Bucy, DPM;  [facility];  Service: Podiatry;  Laterality: Left;   IRRIGATION AND DEBRIDEMENT FOOT Left 11/11/2019   Procedure: Left Foot Wound Irrigation and Debridement;  Surgeon: Evelina Bucy, DPM;  [facility];  Service: Podiatry;  Laterality: Left;   Left arm     Left arm repair (tendon and artery)   PERCUTANEOUS PINNING  07/16/2019   Procedure: Open Reduction Percutaneous Pinning Extremity;  Surgeon: Evelina Bucy, DPM;  Location: [place];  Service: Podiatry;;   PILONIDAL CYST EXCISION N/A 08/08/2014   Procedure: CYST EXCISION PILONIDAL EXTENSIVE;  Surgeon: Jamesetta So, MD;  [facility];  Service: General;  Laterality: N/A;   POLYPECTOMY  02/13/2018   Procedure: POLYPECTOMY;  Surgeon: Danie Binder, MD;  [facility];  Service: Endoscopy;;  transverse colon polyp hs, rectal polyps times 2   SAVORY DILATION N/A 02/13/2018   Procedure: SAVORY DILATION;  Surgeon: Danie Binder, MD;  [facility];  Service: Endoscopy;  Laterality: N/A;   TENDON LENGTHENING Right 12/04/2021   Procedure: TENDON LENGTHENING;  Surgeon: Landis Martins, DPM;  Location: [place];  Service: Podiatry;  Laterality: Right;   TRANSMETATARSAL AMPUTATION Right 12/04/2021   Procedure: TRANSMETATARSAL AMPUTATION;  Surgeon: Landis Martins, DPM;  Location: [place];  Service: Podiatry;  Laterality: Right;   WOUND DEBRIDEMENT Left 09/04/2019   Procedure: DEBRIDEMENT WOUND WITH COMPLEX  REPAIR OF DEHISCENCE;  Surgeon: Evelina Bucy, DPM;  Location: [place];  Service: Podiatry;  Laterality: Left;  Leave patient on stretcher   WOUND DEBRIDEMENT Left 10/16/2019   Procedure: Left foot wound debridement and closure;  Surgeon: Evelina Bucy, DPM;  [facility];  Service: Podiatry;  Laterality: Left;   WOUND DEBRIDEMENT Left 12/18/2019   Procedure: LEFT FOOT DEBRIDEMENT WITH PARTIAL INCISION OF INFECTED BONE;  Surgeon: Evelina Bucy, DPM;  Location: [place];  Service: Podiatry;  Laterality: Left;  LEFT FOOT DEBRIDEMENT WITH PARTIAL INCISION OF INFECTED BONE   WOUND DEBRIDEMENT Right 04/01/2021   Procedure: Right foot wound debridement and irrigation and closure, bone biopsy;  Surgeon:  Evelina Bucy, DPM;  Location: 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  Tobacco Use   Smoking status: Former    Packs/day: 2.50    Years: 25.00    Total pack years: 62.50    Types: Cigarettes    Quit date: 08/05/1984    Years since quitting: 37.8   Smokeless tobacco: Never  Vaping Use   Vaping Use: Never used  Substance and Sexual Activity   Alcohol use: Not Currently    Alcohol/week: 50.0 standard drinks of alcohol    Types: 50 Standard drinks or equivalent per week    Comment: Drinks 6 beers, liquor 4-6oz (both daily) x 35 years   Drug use: Not Currently    Types: Marijuana   Sexual activity: Yes    Birth control/protection: None  Other Topics Concern   Not on file  Social History Narrative   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 Highest level of education:  8th grade   He lives with wife.  No children.   Left handed   Drinks soda, water-no coffee, no tea   Social Determinants of Health   Financial Resource Strain: Not on file  Food Insecurity: Not on file  Transportation Needs: Not on file  Physical Activity: Not on file  Stress: Not on file  Social Connections: Not on file    No Known Allergies   Current Outpatient Medications:    acetaminophen (TYLENOL) 325 MG tablet, Take 2 tablets (650 mg total) by mouth every 6 (six) hours as needed for mild pain., Disp: , Rfl:    albuterol (VENTOLIN HFA) 108 (90 Base)  MCG/ACT inhaler, Inhale 1-2 puffs into the lungs every 4 (four) hours as needed for wheezing or shortness of breath., Disp: , Rfl:    ALPRAZolam (XANAX) 1 MG tablet, Take 1 mg by mouth 4 (four) times daily., Disp: , Rfl:    amLODipine (NORVASC) 10 MG tablet, Take 10 mg by mouth daily., Disp: , Rfl:    aspirin EC 81 MG tablet, Take 81 mg by mouth daily. Swallow whole., Disp: , Rfl:    diclofenac Sodium (VOLTAREN) 1 % GEL, Apply 4 g topically 4 (four) times daily as needed (Apply to painful area over left upper front chest.)., Disp: 50 g, Rfl: 0   doxycycline (VIBRA-TABS) 100 MG tablet, Take 1 tablet (100 mg total) by mouth every 12 (twelve) hours., Disp: 74 tablet, Rfl: 0   ertapenem (INVANZ) IVPB, Inject 1 g into the vein daily. Indication:  Osteomyelitis First Dose: No Last Day of Therapy:  06/20/22 Labs - Once weekly:  CBC/D and BMP, Labs - Every other week:  ESR and CRP Method of administration: Mini-Bag Plus / Gravity Method of administration may be changed at the discretion of home infusion pharmacist based upon assessment of the patient and/or caregiver's ability to self-administer the medication ordered., Disp: 38 Units, Rfl: 0   folic acid (FOLVITE) 1 MG tablet, Take 1 tablet (1 mg total) by mouth daily., Disp: 30 tablet, Rfl: 0   metFORMIN (GLUCOPHAGE) 500 MG tablet, Take 500 mg by mouth 2 (two) times daily., Disp: , Rfl:    morphine (MS CONTIN) 30 MG 12 hr tablet, Take 30 mg by mouth every  12 (twelve) hours., Disp: , Rfl:    Multiple Vitamin (MULTIVITAMIN WITH MINERALS) TABS tablet, Take 1 tablet by mouth daily., Disp: , Rfl:    oxycodone (ROXICODONE) 30 MG immediate release tablet, Take 30 mg by mouth 4 (four) times daily as needed., Disp: , Rfl:    rosuvastatin (CRESTOR) 5 MG tablet, Take 1 tablet (5 mg total) by mouth daily., Disp: 90 tablet, Rfl: 3   thiamine 100 MG tablet, Take 1 tablet (100 mg total) by mouth daily., Disp: 30 tablet, Rfl: 0   Review of Systems  Constitutional:   Negative for activity change, appetite change, chills, diaphoresis, fatigue, fever and unexpected weight change.  HENT:  Negative for congestion, rhinorrhea, sinus pressure, sneezing, sore throat and trouble swallowing.   Eyes:  Negative for photophobia and visual disturbance.  Respiratory:  Negative for cough, chest tightness, shortness of breath, wheezing and stridor.   Cardiovascular:  Negative for chest pain, palpitations and leg swelling.  Gastrointestinal:  Negative for abdominal distention, abdominal pain, anal bleeding, blood in stool, constipation, diarrhea, nausea and vomiting.  Genitourinary:  Negative for difficulty urinating, dysuria, flank pain and hematuria.  Musculoskeletal:  Negative for arthralgias, back pain, gait problem, joint swelling and myalgias.  Skin:  Positive for wound. Negative for color change, pallor and rash.  Neurological:  Negative for dizziness, tremors, weakness and light-headedness.  Hematological:  Negative for adenopathy. Does not bruise/bleed easily.  Psychiatric/Behavioral:  Negative for agitation, behavioral problems, confusion, decreased concentration, dysphoric mood and sleep disturbance.        Objective:   Physical Exam Constitutional:      Appearance: He is well-developed.  HENT:     Head: Normocephalic and atraumatic.  Eyes:     Conjunctiva/sclera: Conjunctivae normal.  Cardiovascular:     Rate and Rhythm: Normal rate and regular rhythm.  Pulmonary:     Effort: Pulmonary effort is normal. No respiratory distress.     Breath sounds: No wheezing.  Abdominal:     General: There is no distension.     Palpations: Abdomen is soft.  Musculoskeletal:        General: No tenderness. Normal range of motion.     Cervical back: Normal range of motion and neck supple.  Skin:    General: Skin is warm and dry.     Coloration: Skin is not pale.     Findings: No erythema or rash.  Neurological:     General: No focal deficit present.     Mental  Status: He is alert and oriented to person, place, and time.  Psychiatric:        Mood and Affect: Mood normal.        Behavior: Behavior normal.        Thought Content: Thought content normal.        Judgment: Judgment normal.     PICC 06/20/2022:     Right lower extremity 06/20/2022:         Assessment & Plan:   Diabetic foot ulcer patient with alcoholic neuropathy with osteomyelitis involving the third fourth and fifth metatarsal shafts as well as some mild involvement of the first and second metatarsal shafts.  Note he would need amputation of the sites to affect cure  He will be seeing Dr. [name] again with podiatry.  In the meantime we will have him complete IV ertapenem on 31 August along with doxycycline.  After he completes the IV ertapenem we will switch over to cefdinir that he can continue  to take with doxycycline and have scheduled him to see me for follow-up in September.  I spent 41  minutes with the patient including than 50% of the time in face to face counseling of the patient regarding his osteomyelitis, personally reviewing home health labs along with review of medical records in preparation for the visit and during the visit and in coordination of his care.

## 2022-06-26 DIAGNOSIS — M86371 Chronic multifocal osteomyelitis, right ankle and foot: Secondary | ICD-10-CM | POA: Diagnosis not present

## 2022-07-06 ENCOUNTER — Ambulatory Visit (INDEPENDENT_AMBULATORY_CARE_PROVIDER_SITE_OTHER): Payer: Medicare Other | Admitting: Podiatry

## 2022-07-06 DIAGNOSIS — E1111 Type 2 diabetes mellitus with ketoacidosis with coma: Secondary | ICD-10-CM

## 2022-07-06 DIAGNOSIS — L97512 Non-pressure chronic ulcer of other part of right foot with fat layer exposed: Secondary | ICD-10-CM | POA: Diagnosis not present

## 2022-07-06 DIAGNOSIS — Z794 Long term (current) use of insulin: Secondary | ICD-10-CM

## 2022-07-06 NOTE — Progress Notes (Signed)
Subjective:  Patient ID: Eugene Diaz, male    DOB: 27-Dec-1965,  MRN: [id number]  Chief Complaint  Patient presents with   Foot Ulcer    56 y.o. male presents for wound care.  Patient presents with follow-up to right transmetatarsal amputation stump.  He states he has been doing Betadine wet-to-dry dressing.  Denies any other acute complaints.   Review of Systems: Negative except as noted in the HPI. Denies N/V/F/Ch.  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[place]) 05/23/2022   Depression    Diabetic neuropathy ([place])    Edema of both lower extremities    Essential tremor    neurologist-- dr Alethia Melendrez--- due to alcohol abuse   GERD (gastroesophageal reflux disease)    History of cellulitis 2020   left lower leg;   recurrent 03-25-2020   History of esophageal stricture    s/p  dilatation 02-13-2018   History of osteomyelitis 2020   left great toe     Hypercholesterolemia    Hypertension    followed by pcp  (nuclear stress test 03-11-2014 low risk w/ no ischemia, ef 65%   Normocytic anemia    followed by pcp   (03-18-2020 had transfusion's 02/ 2021)   Osteomyelitis of great toe of right foot ([place]) 08/18/2021   Peripheral vascular disease ([place])    Severe depression ([place]) 06/09/2021   Status post incision and drainage followed by Dr [name] and pcp   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   (03-17-2020  pt states most of incision is healed with exception an area that is still draining,  daily dressing change),  foot is red but not warm to the touch and has swelling but has improved)   Suicidal ideation 06/09/2021   Syncope and collapse 06/07/2022   Syncope, near 06/20/2022   Thrombocytopenia ([place])    chronic   Type 2 diabetes mellitus ([place])    followed by pcp---     (03-18-2020  pt stated does not check blood sugar)    Current Outpatient Medications:    acetaminophen (TYLENOL) 325 MG tablet, Take 2 tablets (650 mg total) by mouth every 6 (six) hours as needed for mild pain., Disp: , Rfl:    albuterol (VENTOLIN HFA) 108 (90 Base) MCG/ACT inhaler, Inhale 1-2 puffs into the lungs every 4 (four) hours as needed for wheezing or shortness of breath., Disp: , Rfl:    ALPRAZolam (XANAX) 1 MG tablet, Take 1 mg by mouth 4 (four) times daily., Disp: , Rfl:    amLODipine (NORVASC) 10 MG tablet, Take 10 mg by mouth daily., Disp: , Rfl:    aspirin EC 81 MG tablet, Take 81 mg by mouth daily. Swallow whole., Disp: , Rfl:    cefdinir (OMNICEF) 300 MG capsule, Take 1 capsule (300 mg total) by mouth 2 (two) times daily. Start this antibiotic after you have finished your IV antibiotics, Disp: 60 capsule, Rfl: 5   diclofenac Sodium (VOLTAREN) 1 % GEL, Apply 4 g topically 4 (four) times daily as needed (Apply to painful area over left upper front chest.)., Disp: 50 g, Rfl: 0   doxycycline (VIBRA-TABS) 100 MG tablet, Take 1 tablet (100 mg total) by mouth 2 (two) times daily. Continue  doxycycline after you have finished your IV ceftriaxone, Disp: 60 tablet, Rfl: 5   ertapenem (INVANZ) 1 g injection, , Disp: , Rfl:    folic acid (FOLVITE) 1 MG tablet, Take 1 tablet (1 mg total) by mouth daily., Disp: 30 tablet, Rfl: 0   metFORMIN (GLUCOPHAGE) 500 MG tablet, Take 500 mg by mouth 2 (two) times daily., Disp: , Rfl:    morphine (MS CONTIN) 30 MG 12 hr tablet, Take 30 mg by mouth every 12 (twelve) hours., Disp: , Rfl:    Multiple Vitamin (MULTIVITAMIN WITH MINERALS) TABS tablet, Take 1 tablet by mouth daily., Disp: , Rfl:    oxycodone (ROXICODONE) 30 MG immediate release tablet, Take 30 mg by mouth 4 (four) times daily as needed., Disp: , Rfl:    rosuvastatin (CRESTOR) 5 MG tablet, Take 1 tablet (5 mg total) by mouth daily., Disp: 90 tablet, Rfl: 3   thiamine 100 MG tablet, Take 1 tablet  (100 mg total) by mouth daily., Disp: 30 tablet, Rfl: 0  Social History   Tobacco Use  Smoking Status Former   Packs/day: 2.50   Years: 25.00   Total pack years: 62.50   Types: Cigarettes   Quit date: 08/05/1984   Years since quitting: 37.9  Smokeless Tobacco Never    No Known Allergies Objective:  There were no vitals filed for this visit. There is no height or weight on file to calculate BMI. Constitutional Well developed. Well nourished.  Vascular Dorsalis pedis pulses palpable bilaterally. Posterior tibial pulses palpable bilaterally. Capillary refill normal to all digits.  No cyanosis or clubbing noted. Pedal hair growth normal.  Neurologic Normal speech. Oriented to person, place, and time. Protective sensation absent  Dermatologic Wound Location: Right transmetatarsal amputation site stump wound with fat layer exposed.  No purulent drainage noted does not probe down to bone. Wound Base: Mixed Granular/Fibrotic Peri-wound: Calloused Exudate: Scant/small amount Serosanguinous exudate Wound Measurements: -See above  Orthopedic: No pain to palpation either foot.   Radiographs: None Assessment:   1. Ulcer of right foot with fat layer exposed ([place])   2. Type 2 diabetes mellitus with ketoacidosis and coma, with long-term current use of insulin ([place])     Plan:  Patient was evaluated and treated and all questions answered.  Ulcer right transmetatarsal amputation stump wound with fat layer exposed -Debridement as below. -Dressed with Betadine wet-to-dry, DSD. -Continue off-loading with cam boot -At this time and clinically not concerning for infection as the wound is not probing down to bone appears to be very superficial without any tunneling.  No correlation with third fourth and fifth metatarsal bone per previous MRI. -He will benefit from negative pressure wound therapy.  I will work on setting that up.  Procedure: Excisional Debridement of Wound~stagnant Tool:  Sharp chisel blade/tissue nipper Rationale: Removal of non-viable soft tissue from the wound to promote healing.  Anesthesia: none Pre-Debridement Wound Measurements: 1 cm x 0.5 cm x 0.4 cm  Post-Debridement Wound Measurements: 1.2 cm x 0.6 cm x 0.4 cm  Type of Debridement: Sharp Excisional Tissue Removed: Non-viable soft tissue Blood loss: Minimal (<50cc) Depth of Debridement: subcutaneous tissue. Technique: Sharp excisional debridement to bleeding, viable wound base.  Wound Progress: Appears to be stagnant but not deep Site healing conversation 7 Dressing: Dry, sterile, compression dressing. Disposition: Patient tolerated procedure well. Patient to return in 1 week for follow-up.  No follow-ups on file.

## 2022-07-08 DIAGNOSIS — R55 Syncope and collapse: Secondary | ICD-10-CM | POA: Diagnosis not present

## 2022-07-12 DIAGNOSIS — T8189XA Other complications of procedures, not elsewhere classified, initial encounter: Secondary | ICD-10-CM | POA: Diagnosis not present

## 2022-07-20 ENCOUNTER — Other Ambulatory Visit: Payer: Self-pay

## 2022-07-20 ENCOUNTER — Encounter: Payer: Self-pay | Admitting: Infectious Disease

## 2022-07-20 ENCOUNTER — Ambulatory Visit (INDEPENDENT_AMBULATORY_CARE_PROVIDER_SITE_OTHER): Payer: Medicare Other | Admitting: Infectious Disease

## 2022-07-20 VITALS — BP 150/85 | HR 70 | Temp 98.5°F | Ht 74.0 in | Wt 238.0 lb

## 2022-07-20 DIAGNOSIS — E11628 Type 2 diabetes mellitus with other skin complications: Secondary | ICD-10-CM

## 2022-07-20 DIAGNOSIS — G621 Alcoholic polyneuropathy: Secondary | ICD-10-CM

## 2022-07-20 DIAGNOSIS — L089 Local infection of the skin and subcutaneous tissue, unspecified: Secondary | ICD-10-CM

## 2022-07-20 DIAGNOSIS — M86172 Other acute osteomyelitis, left ankle and foot: Secondary | ICD-10-CM

## 2022-07-20 NOTE — Progress Notes (Signed)
Subjective:  Chief complaint: Follow-up for foot infection and patient with alcoholic neuropathy and osteomyelitis    Patient ID: Eugene Diaz, male    DOB: 30-Dec-1965, 56 y.o.   MRN: [id number]  HPI  Eugene Diaz is a 56 y.o. male alcoholic neuropathy diabetes mellitus and osteomyelitis who is well-known to me from my having seen him as an inpatient and followed him in our clinic.  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  He had additional further debridement as well with bone biopsy of the first metatarsal and wound closure.  He grew E. Coli group B streptococcus as well as beta-lactamase producing Prevotella.  He was narrowed to intravenous cefazolin with oral metronidazole and discharged home with this with his sister helping him with antibiotics.  We switched him over the IV therapy to Augmentin and he was followed by [name] by podiatry.  He had some significant problems with depression and passive suicidal ideation and related to the loss of his wife many years ago.  He improved significantly emotionally and actually had reduced his alcohol intake drastically.  He is not completely clean but his intake is dramatically less than in the past when he drank on a daily basis.  Followed him closely and saw him last in November 2022.   He came off his antibiotics and he came back and was seen by my partner [name] and seem to have no evidence of active infection at that point in time.   Unfortunately interim he developed worsening gangrenous changes involving the third toe and underwent transmetatarsal amputation with [name] on December 04, 2021.  Apparently approxi a month ago his foot got caught on a nail  that was sticking out of his porch.  He developed intermittent redness and swelling that worsened recently and was convinced by family come to the emergency department with worsening flexion of his feet.  His foot was warm and swollen and erythematous.  MRI was performed at [facility] which showed a new soft tissue ulcer in the medial forefoot distal to the coronal and medial to the first metatarsal amputation stump.  There is edema and swelling of the ankle and dorsal midfoot and there is marrow edema within the third and fourth and remaining fifth metatarsal shafts as well as some mild edema in the distal aspect the remaining first and second metatarsal shafts all concerning for osteomyelitis.   Patient was seen by general surgery at [facility] he did not want to intervene surgically.  They wanted to try antibiotics.  I was very skeptical that antibiotics were going to get [place] very far but I i ertapenem with doxycycline June 30, 2022.  In the interim he was hospitalized with an episode of unresponsiveness that was thought to be due to too many sedating medications from home though he is seen [name] with cardiology who seems to think the patient had a near syncopal event rather than a drug related overdose.  Patient has had a monitor on since then.  He is also seen Dr. [name] for his foot but it seems that Dr. [name] is not aware (based on his notes prior to my last visiit  the patient's recent hospital admission or MRI.  He has completed IV antibiotics and is currently on oral doxycycline and cefdinir.  His wound is continue to improve and he is continue to follow with Dr. [name].  I need to communicate with Dr. [name] about what he thinks about the MRI findings I am remembering that he is not convinced these are areas of osteomyelitis.     Past Medical History:  Diagnosis Date   Alcohol abuse    Alcoholic peripheral neuropathy (HCC)    Anxiety    Arthritis    Asthma     followed by pcp   B12 deficiency    CAD in native artery 06/07/2022   Chronic multifocal osteomyelitis of right foot ([place]) 05/23/2022   Depression    Diabetic neuropathy ([place])    Edema of both lower extremities    Essential tremor    neurologist-- dr patel--- due to alcohol abuse   GERD (gastroesophageal reflux disease)    History of cellulitis 2020   left lower leg;   recurrent 03-25-2020   History of esophageal stricture    s/p  dilatation 02-13-2018   History of osteomyelitis 2020   left great toe     Hypercholesterolemia    Hypertension    followed by pcp  (nuclear stress test 03-11-2014 low risk w/ no ischemia, ef 65%   Normocytic anemia    followed by pcp   (03-18-2020 had transfusion's 02/ 2021)   Osteomyelitis of great toe of right foot ([place]) 08/18/2021   Peripheral vascular disease ([place])    Severe depression ([place]) 06/09/2021   Status post incision and drainage followed by Dr [name] and pcp   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   (03-17-2020  pt states most of incision is healed with exception an area that is still draining,  daily dressing change),  foot is red but not warm to the touch and has swelling but has improved)   Suicidal ideation 06/09/2021   Syncope and collapse 06/07/2022   Syncope, near 06/20/2022   Thrombocytopenia ([place])    chronic   Type 2 diabetes mellitus ([place])    followed by pcp---    (03-18-2020  pt stated does not check blood sugar)    Past Surgical History:  Procedure Laterality Date   AMPUTATION Left 10/16/2019   Procedure: Left partial second ray resection; placement of antibiotic beads;  Surgeon: Evelina Bucy, DPM;  Location: [facility];  Service: Podiatry;  Laterality: Left;   AMPUTATION Left 11/13/2019   Procedure: Left Midfoot Amputation - Transmetatarsal vs. Lisfranc; Placement antibiotic beads;  Surgeon: Evelina Bucy, DPM;  Location: [facility];  Service: Podiatry;  Laterality: Left;   AMPUTATION Left 12/21/2019   Procedure: Chopart  Amputation left foot;  Surgeon: Evelina Bucy, DPM;  Location: [place];  Service: Podiatry;  Laterality: Left;   AMPUTATION Left 06/24/2020   Procedure: LEFT BELOW KNEE AMPUTATION;  Surgeon: Newt Minion, MD;  Location: [place];  Service: Orthopedics;  Laterality: Left;   AMPUTATION Right 03/29/2021   Procedure: AMPUTATION RAY;  Surgeon: Evelina Bucy, DPM;  Location: [place];  Service: Podiatry;  Laterality: Right;   AMPUTATION TOE Left 08/14/2019   Procedure: AMPUTATION GREAT TOE;  Surgeon: Evelina Bucy, DPM;  [facility];  Service: Podiatry;  Laterality: Left;   APPLICATION OF WOUND VAC Left 12/21/2019   Procedure: Application Of Wound Vac;  Surgeon: Evelina Bucy, DPM;  Location: [place];  Service: Podiatry;  Laterality: Left;   BIOPSY  02/13/2018  Procedure: BIOPSY;  Surgeon: Danie Binder, MD;  [facility];  Service: Endoscopy;;  transverse colon biopsy, gastric biopsy   COLONOSCOPY WITH PROPOFOL N/A 02/13/2018   Procedure: COLONOSCOPY WITH PROPOFOL;  Surgeon: Danie Binder, MD;  [facility];  Service: Endoscopy;  Laterality: N/A;  11:15am   ESOPHAGOGASTRODUODENOSCOPY (EGD) WITH PROPOFOL N/A 02/13/2018   Procedure: ESOPHAGOGASTRODUODENOSCOPY (EGD) WITH PROPOFOL;  Surgeon: Danie Binder, MD;  [facility];  Service: Endoscopy;  Laterality: N/A;   I & D EXTREMITY Left 07/16/2019   Procedure: Insicion  AND DEBRIDEMENT EXTREMITY;  Surgeon: Evelina Bucy, DPM;  Location: [place];  Service: Podiatry;  Laterality: Left;   I & D EXTREMITY Left 06/22/2020   Procedure: IRRIGATION AND DEBRIDEMENT EXTREMITY, left foot and ankle;  Surgeon: Evelina Bucy, DPM;  Location: [place];  Service: Podiatry;  Laterality: Left;   I & D EXTREMITY Right 03/29/2021   Procedure: IRRIGATION AND DEBRIDEMENT EXTREMITY;  Surgeon: Evelina Bucy, DPM;  Location: [place];  Service: Podiatry;  Laterality: Right;   INCISION AND DRAINAGE OF WOUND Left 10/13/2019   Procedure:  IRRIGATION AND DEBRIDEMENT WOUND OF LEFT FOOT AND FIRST METATARSAL RESECTION;  Surgeon: Evelina Bucy, DPM;  [facility];  Service: Podiatry;  Laterality: Left;   IRRIGATION AND DEBRIDEMENT FOOT Left 11/11/2019   Procedure: Left Foot Wound Irrigation and Debridement;  Surgeon: Evelina Bucy, DPM;  [facility];  Service: Podiatry;  Laterality: Left;   Left arm     Left arm repair (tendon and artery)   PERCUTANEOUS PINNING  07/16/2019   Procedure: Open Reduction Percutaneous Pinning Extremity;  Surgeon: Evelina Bucy, DPM;  Location: [place];  Service: Podiatry;;   PILONIDAL CYST EXCISION N/A 08/08/2014   Procedure: CYST EXCISION PILONIDAL EXTENSIVE;  Surgeon: Jamesetta So, MD;  [facility];  Service: General;  Laterality: N/A;   POLYPECTOMY  02/13/2018   Procedure: POLYPECTOMY;  Surgeon: Danie Binder, MD;  [facility];  Service: Endoscopy;;  transverse colon polyp hs, rectal polyps times 2   SAVORY DILATION N/A 02/13/2018   Procedure: SAVORY DILATION;  Surgeon: Danie Binder, MD;  [facility];  Service: Endoscopy;  Laterality: N/A;   TENDON LENGTHENING Right 12/04/2021   Procedure: TENDON LENGTHENING;  Surgeon: Landis Martins, DPM;  Location: [place];  Service: Podiatry;  Laterality: Right;   TRANSMETATARSAL AMPUTATION Right 12/04/2021   Procedure: TRANSMETATARSAL AMPUTATION;  Surgeon: Landis Martins, DPM;  Location: [place];  Service: Podiatry;  Laterality: Right;   WOUND DEBRIDEMENT Left 09/04/2019   Procedure: DEBRIDEMENT WOUND WITH COMPLEX  REPAIR OF DEHISCENCE;  Surgeon: Evelina Bucy, DPM;  Location: [place];  Service: Podiatry;  Laterality: Left;  Leave patient on stretcher   WOUND DEBRIDEMENT Left 10/16/2019   Procedure: Left foot wound debridement and closure;  Surgeon: Evelina Bucy, DPM;  [facility];  Service: Podiatry;  Laterality: Left;   WOUND DEBRIDEMENT Left 12/18/2019   Procedure: LEFT FOOT DEBRIDEMENT WITH  PARTIAL INCISION OF INFECTED BONE;  Surgeon: Evelina Bucy, DPM;  Location: [place];  Service: Podiatry;  Laterality: Left;  LEFT FOOT DEBRIDEMENT WITH PARTIAL INCISION OF INFECTED BONE   WOUND DEBRIDEMENT Right 04/01/2021   Procedure: Right foot wound debridement and irrigation and closure, bone biopsy;  Surgeon: Evelina Bucy, DPM;  Location: [place];  Service: Podiatry;  Laterality: Right;    Family History  Problem Relation Age  of Onset   Heart attack Mother        Living, 65   Cancer Father        Deceased   Healthy Brother    Healthy Sister    Colon cancer Neg Hx    Colon polyps Neg Hx       Social History   Socioeconomic History   Marital status: Widowed    Spouse name: Not on file   Number of children: 0   Years of education: Not on file   Highest education level: 8th grade  Occupational History   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  Tobacco Use   Smoking status: Former    Packs/day: 2.50    Years: 25.00    Total pack years: 62.50    Types: Cigarettes    Quit date: 08/05/1984    Years since quitting: 37.9   Smokeless tobacco: Never  Vaping Use   Vaping Use: Never used  Substance and Sexual Activity   Alcohol use: Not Currently    Alcohol/week: 50.0 standard drinks of alcohol    Types: 50 Standard drinks or equivalent per week    Comment: Drinks 6 beers, liquor 4-6oz (both daily) x 35 years   Drug use: Not Currently    Types: Marijuana   Sexual activity: Yes    Birth control/protection: None  Other Topics Concern   Not on file  Social History Narrative   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 Highest level of education:  8th grade   He lives with wife.  No children.   Left handed   Drinks soda, water-no coffee, no tea   Social Determinants of Health   Financial Resource Strain: Not on file  Food Insecurity: Not on file  Transportation Needs: Not on file  Physical Activity:  Not on file  Stress: Not on file  Social Connections: Not on file    No Known Allergies   Current Outpatient Medications:    acetaminophen (TYLENOL) 325 MG tablet, Take 2 tablets (650 mg total) by mouth every 6 (six) hours as needed for mild pain., Disp: , Rfl:    albuterol (VENTOLIN HFA) 108 (90 Base) MCG/ACT inhaler, Inhale 1-2 puffs into the lungs every 4 (four) hours as needed for wheezing or shortness of breath., Disp: , Rfl:    ALPRAZolam (XANAX) 1 MG tablet, Take 1 mg by mouth 4 (four) times daily., Disp: , Rfl:    amLODipine (NORVASC) 10 MG tablet, Take 10 mg by mouth daily., Disp: , Rfl:    aspirin EC 81 MG tablet, Take 81 mg by mouth daily. Swallow whole., Disp: , Rfl:    cefdinir (OMNICEF) 300 MG capsule, Take 1 capsule (300 mg total) by mouth 2 (two) times daily. Start this antibiotic after you have finished your IV antibiotics, Disp: 60 capsule, Rfl: 5   diclofenac Sodium (VOLTAREN) 1 % GEL, Apply 4 g topically 4 (four) times daily as needed (Apply to painful area over left upper front chest.)., Disp: 50 g, Rfl: 0   doxycycline (VIBRA-TABS) 100 MG tablet, Take 1 tablet (100 mg total) by mouth 2 (two) times daily. Continue doxycycline after you have finished your IV ceftriaxone, Disp: 60 tablet, Rfl: 5   ertapenem (INVANZ) 1 g injection, , Disp: , Rfl:    folic acid (FOLVITE) 1 MG tablet, Take 1 tablet (1 mg total) by mouth daily., Disp: 30 tablet, Rfl: 0   metFORMIN (GLUCOPHAGE)  500 MG tablet, Take 500 mg by mouth 2 (two) times daily., Disp: , Rfl:    morphine (MS CONTIN) 30 MG 12 hr tablet, Take 30 mg by mouth every 12 (twelve) hours., Disp: , Rfl:    Multiple Vitamin (MULTIVITAMIN WITH MINERALS) TABS tablet, Take 1 tablet by mouth daily., Disp: , Rfl:    oxycodone (ROXICODONE) 30 MG immediate release tablet, Take 30 mg by mouth 4 (four) times daily as needed., Disp: , Rfl:    rosuvastatin (CRESTOR) 5 MG tablet, Take 1 tablet (5 mg total) by mouth daily., Disp: 90 tablet, Rfl:  3   thiamine 100 MG tablet, Take 1 tablet (100 mg total) by mouth daily., Disp: 30 tablet, Rfl: 0   Review of Systems  Constitutional:  Negative for activity change, appetite change, chills, diaphoresis, fatigue, fever and unexpected weight change.  HENT:  Negative for congestion, rhinorrhea, sinus pressure, sneezing, sore throat and trouble swallowing.   Eyes:  Negative for photophobia and visual disturbance.  Respiratory:  Negative for cough, chest tightness, shortness of breath, wheezing and stridor.   Cardiovascular:  Negative for chest pain, palpitations and leg swelling.  Gastrointestinal:  Negative for abdominal distention, abdominal pain, anal bleeding, blood in stool, constipation, diarrhea, nausea and vomiting.  Genitourinary:  Negative for difficulty urinating, dysuria, flank pain and hematuria.  Musculoskeletal:  Negative for arthralgias, back pain, gait problem, joint swelling and myalgias.  Skin:  Positive for wound. Negative for color change, pallor and rash.  Neurological:  Negative for dizziness, tremors, weakness and light-headedness.  Hematological:  Negative for adenopathy. Does not bruise/bleed easily.  Psychiatric/Behavioral:  Negative for agitation, behavioral problems, confusion, decreased concentration, dysphoric mood and sleep disturbance.        Objective:   Physical Exam Constitutional:      Appearance: He is well-developed.  HENT:     Head: Normocephalic and atraumatic.  Eyes:     Conjunctiva/sclera: Conjunctivae normal.  Cardiovascular:     Rate and Rhythm: Normal rate and regular rhythm.  Pulmonary:     Effort: Pulmonary effort is normal. No respiratory distress.     Breath sounds: No wheezing.  Abdominal:     General: There is no distension.     Palpations: Abdomen is soft.  Musculoskeletal:        General: No tenderness. Normal range of motion.     Cervical back: Normal range of motion and neck supple.  Skin:    General: Skin is warm and dry.      Coloration: Skin is not pale.     Findings: No erythema or rash.  Neurological:     General: No focal deficit present.     Mental Status: He is alert and oriented to person, place, and time.  Psychiatric:        Mood and Affect: Mood normal.        Behavior: Behavior normal.        Thought Content: Thought content normal.        Judgment: Judgment normal.     Right lower extremity 06/20/2022:    Right foot 07/20/2022:        Assessment & Plan:   Osteomyelitis involving right foot:  The area he had his most recent amputation is healing.  The concern is the area in the third through fifth metatarsals that lit up on MRI when he was hospitalized at [facility].  If these areas are indeed fact that he will surely need a more proximal amputation.  We will plan on checking inflammatory markers today and I will reach out to Dr. [name].  If if he feels that the findings are less likely to be due to osteomyelitis and inflammatory markers are reassuring I will have Khian stop his antibiotics and observe him off antibiotics.  We will induced neuropathy: He is reduced his alcohol intake dramatically sometimes goes several days is 0 alcohol and drinks much much less than he did in the past.  Unfortunately the neuropathy is not reversible.

## 2022-07-21 ENCOUNTER — Telehealth: Payer: Self-pay

## 2022-07-21 LAB — CBC WITH DIFFERENTIAL/PLATELET
Absolute Monocytes: 420 cells/uL (ref 200–950)
Basophils Absolute: 63 cells/uL (ref 0–200)
Basophils Relative: 0.9 %
Eosinophils Absolute: 882 cells/uL — ABNORMAL HIGH (ref 15–500)
Eosinophils Relative: 12.6 %
HCT: 48.3 % (ref 38.5–50.0)
Hemoglobin: 16.4 g/dL (ref 13.2–17.1)
Lymphs Abs: 1939 cells/uL (ref 850–3900)
MCH: 31.6 pg (ref 27.0–33.0)
MCHC: 34 g/dL (ref 32.0–36.0)
MCV: 93.1 fL (ref 80.0–100.0)
MPV: 10.5 fL (ref 7.5–12.5)
Monocytes Relative: 6 %
Neutro Abs: 3696 cells/uL (ref 1500–7800)
Neutrophils Relative %: 52.8 %
Platelets: 199 10*3/uL (ref 140–400)
RBC: 5.19 10*6/uL (ref 4.20–5.80)
RDW: 12.3 % (ref 11.0–15.0)
Total Lymphocyte: 27.7 %
WBC: 7 10*3/uL (ref 3.8–10.8)

## 2022-07-21 LAB — COMPLETE METABOLIC PANEL WITH GFR
AG Ratio: 1.1 (calc) (ref 1.0–2.5)
ALT: 9 U/L (ref 9–46)
AST: 11 U/L (ref 10–35)
Albumin: 3.9 g/dL (ref 3.6–5.1)
Alkaline phosphatase (APISO): 105 U/L (ref 35–144)
BUN: 11 mg/dL (ref 7–25)
CO2: 26 mmol/L (ref 20–32)
Calcium: 9.3 mg/dL (ref 8.6–10.3)
Chloride: 104 mmol/L (ref 98–110)
Creat: 0.75 mg/dL (ref 0.70–1.30)
Globulin: 3.6 g/dL (calc) (ref 1.9–3.7)
Glucose, Bld: 185 mg/dL — ABNORMAL HIGH (ref 65–99)
Potassium: 4.3 mmol/L (ref 3.5–5.3)
Sodium: 137 mmol/L (ref 135–146)
Total Bilirubin: 0.4 mg/dL (ref 0.2–1.2)
Total Protein: 7.5 g/dL (ref 6.1–8.1)
eGFR: 105 mL/min/{1.73_m2} (ref >=60)

## 2022-07-21 LAB — SEDIMENTATION RATE: Sed Rate: 9 mm/h (ref 0–20)

## 2022-07-21 LAB — C-REACTIVE PROTEIN: CRP: 23.8 mg/L — ABNORMAL HIGH (ref <8.0)

## 2022-07-21 NOTE — Telephone Encounter (Signed)
Spoke with Eugene Diaz, advised him that [name] would like for him to stop his antibiotics (doxy and cefdinir) as Dr. [name] would like to perform a bone biopsy.   He has not yet taken his antibiotics today, advised him to hold them until he is advised by our office that he can resume. He has follow up with Dr. [name] 9/29. Patient verbalized understanding and has no further questions.   Beryle Flock, RN

## 2022-07-21 NOTE — Telephone Encounter (Signed)
-----   Message from Truman Hayward, MD sent at 07/21/2022  9:41 AM EDT ----- Regarding: FW: Can we have Jager stop his antibiotics  . I spoke w Dr [name] and he is planning on doing a bone biopsy where he has the ulcer  in a few weeks and i would like Nimai off the antibiotics to improve yield on bone biopsy. Dr [name] does not think the other areas that lit up on mri 3-5 metatarsal remnants are bone infection and I also therefore want to observe [place]man OFF a abx ----- Message ----- From: Cheyenne Adas Lab Results In Sent: 07/20/2022  11:06 PM EDT To: Truman Hayward, MD

## 2022-07-29 ENCOUNTER — Ambulatory Visit (INDEPENDENT_AMBULATORY_CARE_PROVIDER_SITE_OTHER): Payer: Medicare Other | Admitting: Podiatry

## 2022-07-29 DIAGNOSIS — L97512 Non-pressure chronic ulcer of other part of right foot with fat layer exposed: Secondary | ICD-10-CM | POA: Diagnosis not present

## 2022-07-29 NOTE — Progress Notes (Unsigned)
Subjective:  Patient ID: Eugene Diaz, male    DOB: [birth year],  MRN: [id number]  Chief Complaint  Patient presents with   Foot Ulcer    56 y.o. male presents for wound care.  Patient presents with follow-up to right transmetatarsal amputation stump.  He states he has been doing Betadine wet-to-dry dressing.  Denies any other acute complaints.   Review of Systems: Negative except as noted in the HPI. Denies N/V/F/Ch.  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[place]) 05/23/2022   Depression    Diabetic neuropathy ([place])    Edema of both lower extremities    Essential tremor    neurologist-- dr Talea Manges--- due to alcohol abuse   GERD (gastroesophageal reflux disease)    History of cellulitis 2020   left lower leg;   recurrent 03-25-2020   History of esophageal stricture    s/p  dilatation 02-13-2018   History of osteomyelitis 2020   left great toe     Hypercholesterolemia    Hypertension    followed by pcp  (nuclear stress test 03-11-2014 low risk w/ no ischemia, ef 65%   Normocytic anemia    followed by pcp   (03-18-2020 had transfusion's 02/ 2021)   Osteomyelitis of great toe of right foot ([place]) 08/18/2021   Peripheral vascular disease ([place])    Severe depression ([place]) 06/09/2021   Status post incision and drainage followed by Dr [name] and pcp   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   (03-17-2020  pt states most of incision is healed with exception an area that is still draining,  daily dressing change),  foot is red but not warm to the touch and has swelling but has improved)   Suicidal ideation 06/09/2021   Syncope and collapse 06/07/2022   Syncope, near 06/20/2022   Thrombocytopenia ([place])    chronic   Type 2 diabetes mellitus ([place])    followed by pcp---     (03-18-2020  pt stated does not check blood sugar)    Current Outpatient Medications:    acetaminophen (TYLENOL) 325 MG tablet, Take 2 tablets (650 mg total) by mouth every 6 (six) hours as needed for mild pain., Disp: , Rfl:    albuterol (VENTOLIN HFA) 108 (90 Base) MCG/ACT inhaler, Inhale 1-2 puffs into the lungs every 4 (four) hours as needed for wheezing or shortness of breath., Disp: , Rfl:    ALPRAZolam (XANAX) 1 MG tablet, Take 1 mg by mouth 4 (four) times daily., Disp: , Rfl:    amLODipine (NORVASC) 10 MG tablet, Take 10 mg by mouth daily., Disp: , Rfl:    aspirin EC 81 MG tablet, Take 81 mg by mouth daily. Swallow whole., Disp: , Rfl:    cefdinir (OMNICEF) 300 MG capsule, Take 1 capsule (300 mg total) by mouth 2 (two) times daily. Start this antibiotic after you have finished your IV antibiotics (Patient not taking: Reported on 07/20/2022), Disp: 60 capsule, Rfl: 5   diclofenac Sodium (VOLTAREN) 1 % GEL, Apply 4 g topically 4 (four) times daily as needed (Apply to painful area over left upper front chest.)., Disp: 50 g, Rfl: 0   doxycycline (VIBRA-TABS) 100 MG tablet, Take 1 tablet (100 mg total) by  mouth 2 (two) times daily. Continue doxycycline after you have finished your IV ceftriaxone, Disp: 60 tablet, Rfl: 5   ertapenem (INVANZ) 1 g injection, , Disp: , Rfl:    folic acid (FOLVITE) 1 MG tablet, Take 1 tablet (1 mg total) by mouth daily., Disp: 30 tablet, Rfl: 0   metFORMIN (GLUCOPHAGE) 500 MG tablet, Take 500 mg by mouth 2 (two) times daily., Disp: , Rfl:    morphine (MS CONTIN) 30 MG 12 hr tablet, Take 30 mg by mouth every 12 (twelve) hours., Disp: , Rfl:    Multiple Vitamin (MULTIVITAMIN WITH MINERALS) TABS tablet, Take 1 tablet by mouth daily. (Patient not taking: Reported on 07/20/2022), Disp: , Rfl:    oxycodone (ROXICODONE) 30 MG immediate release tablet, Take 30 mg by mouth 4 (four) times daily as needed., Disp: , Rfl:    rosuvastatin (CRESTOR) 5 MG tablet, Take 1 tablet (5 mg  total) by mouth daily., Disp: 90 tablet, Rfl: 3   thiamine 100 MG tablet, Take 1 tablet (100 mg total) by mouth daily., Disp: 30 tablet, Rfl: 0  Social History   Tobacco Use  Smoking Status Some Days   Packs/day: 2.50   Years: 25.00   Total pack years: 62.50   Types: Cigarettes, Cigars   Last attempt to quit: 08/05/1984   Years since quitting: 38.0  Smokeless Tobacco Never  Tobacco Comments   Quit smoking cigarettes; now smokes cigars, wants to quit    No Known Allergies Objective:  There were no vitals filed for this visit. There is no height or weight on file to calculate BMI. Constitutional Well developed. Well nourished.  Vascular Dorsalis pedis pulses palpable bilaterally. Posterior tibial pulses palpable bilaterally. Capillary refill normal to all digits.  No cyanosis or clubbing noted. Pedal hair growth normal.  Neurologic Normal speech. Oriented to person, place, and time. Protective sensation absent  Dermatologic Wound Location: Right transmetatarsal amputation site stump wound with fat layer exposed.  No purulent drainage noted does not probe down to bone. Wound Base: Mixed Granular/Fibrotic Peri-wound: Calloused Exudate: Scant/small amount Serosanguinous exudate Wound Measurements: -See above  Orthopedic: No pain to palpation either foot.   Radiographs: None Assessment:   No diagnosis found.   Plan:  Patient was evaluated and treated and all questions answered.  Ulcer right transmetatarsal amputation stump wound with fat layer exposed -Debridement as below. -Dressed with Betadine wet-to-dry, DSD. -Continue off-loading with cam boot -At this time and clinically not concerning for infection as the wound is not probing down to bone appears to be very superficial without any tunneling.  No correlation with third fourth and fifth metatarsal bone per previous MRI. -We will attempt to do wound VAC therapy as the wound is responsive and needed to local wound  care.  Once again there is no clinical correlation that probes down to bone. -Wound VAC was applied and set at 125 mmHg pressure.  He will do the wound VAC change himself  Procedure: Excisional Debridement of Wound~stagnant Tool: Sharp chisel blade/tissue nipper Rationale: Removal of non-viable soft tissue from the wound to promote healing.  Anesthesia: none Pre-Debridement Wound Measurements: 1 cm x 0.5 cm x 0.4 cm  Post-Debridement Wound Measurements: 1.2 cm x 0.6 cm x 0.4 cm  Type of Debridement: Sharp Excisional Tissue Removed: Non-viable soft tissue Blood loss: Minimal (<50cc) Depth of Debridement: subcutaneous tissue. Technique: Sharp excisional debridement to bleeding, viable wound base.  Wound Progress: Appears to be stagnant but not deep Site healing conversation 7  Dressing: Dry, sterile, compression dressing. Disposition: Patient tolerated procedure well. Patient to return in 1 week for follow-up.  No follow-ups on file.

## 2022-08-15 DIAGNOSIS — G546 Phantom limb syndrome with pain: Secondary | ICD-10-CM | POA: Diagnosis not present

## 2022-08-15 DIAGNOSIS — M150 Primary generalized (osteo)arthritis: Secondary | ICD-10-CM | POA: Diagnosis not present

## 2022-08-15 DIAGNOSIS — Z89512 Acquired absence of left leg below knee: Secondary | ICD-10-CM | POA: Diagnosis not present

## 2022-08-15 DIAGNOSIS — E1140 Type 2 diabetes mellitus with diabetic neuropathy, unspecified: Secondary | ICD-10-CM | POA: Diagnosis not present

## 2022-08-15 DIAGNOSIS — I10 Essential (primary) hypertension: Secondary | ICD-10-CM | POA: Diagnosis not present

## 2022-08-15 DIAGNOSIS — Z89421 Acquired absence of other right toe(s): Secondary | ICD-10-CM | POA: Diagnosis not present

## 2022-08-15 DIAGNOSIS — M86371 Chronic multifocal osteomyelitis, right ankle and foot: Secondary | ICD-10-CM | POA: Diagnosis not present

## 2022-08-29 ENCOUNTER — Ambulatory Visit (INDEPENDENT_AMBULATORY_CARE_PROVIDER_SITE_OTHER): Payer: Medicare Other | Admitting: Infectious Disease

## 2022-08-29 ENCOUNTER — Other Ambulatory Visit: Payer: Self-pay

## 2022-08-29 ENCOUNTER — Encounter: Payer: Self-pay | Admitting: Infectious Disease

## 2022-08-29 VITALS — BP 156/85 | HR 97 | Resp 16 | Ht 74.0 in | Wt 233.8 lb

## 2022-08-29 DIAGNOSIS — L089 Local infection of the skin and subcutaneous tissue, unspecified: Secondary | ICD-10-CM | POA: Diagnosis not present

## 2022-08-29 DIAGNOSIS — E11628 Type 2 diabetes mellitus with other skin complications: Secondary | ICD-10-CM

## 2022-08-29 DIAGNOSIS — Z7185 Encounter for immunization safety counseling: Secondary | ICD-10-CM | POA: Diagnosis not present

## 2022-08-29 DIAGNOSIS — G621 Alcoholic polyneuropathy: Secondary | ICD-10-CM | POA: Diagnosis not present

## 2022-08-29 DIAGNOSIS — M86172 Other acute osteomyelitis, left ankle and foot: Secondary | ICD-10-CM | POA: Diagnosis not present

## 2022-08-29 HISTORY — DX: Encounter for immunization safety counseling: Z71.85

## 2022-08-29 NOTE — Progress Notes (Signed)
Subjective:  Chief complaint: Follow-up for chronic wound infection with history of osteomyelitis in the context of alcoholic neuropathy   Patient ID: Eugene Diaz, male    DOB: 1966-04-13, 56 y.o.   MRN: [id number]  HPI  Eugene Diaz is a 56 y.o. male alcoholic neuropathy diabetes mellitus and osteomyelitis who is well-known to me from my having seen him as an inpatient and followed him in our clinic.  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  He had additional further debridement as well with bone biopsy of the first metatarsal and wound closure.  He grew Eugene Diaz group B streptococcus as well as beta-lactamase producing Prevotella.  He was narrowed to intravenous cefazolin with oral metronidazole and discharged home with this with his sister helping him with antibiotics.  We switched him over the IV therapy to Augmentin and he was followed by Eugene Diaz by podiatry.  He had some significant problems with depression and passive suicidal ideation and related to the loss of his wife many years ago.  He improved significantly emotionally and actually had reduced his alcohol intake drastically.  He is not completely clean but his intake is dramatically less than in the past when he drank on a daily basis.  Followed him closely and saw him last in November 2022.   He came off his antibiotics and he came back and was seen by my partner Eugene Diaz and seem to have no evidence of active infection at that point in time.   Unfortunately interim he developed worsening gangrenous changes involving the third toe and underwent transmetatarsal amputation with Eugene Diaz on December 04, 2021.  Apparently approxi a month ago his foot  got caught on a nail that was sticking out of his porch.  He developed intermittent redness and swelling that worsened recently and was convinced by family come to the emergency department with worsening flexion of his feet.  His foot was warm and swollen and erythematous.  MRI was performed at [facility] which showed a new soft tissue ulcer in the medial forefoot distal to the coronal and medial to the first metatarsal amputation stump.  There is edema and swelling of the ankle and dorsal midfoot and there is marrow edema within the third and fourth and remaining fifth metatarsal shafts as well as some mild edema in the distal aspect the remaining first and second metatarsal shafts all concerning for osteomyelitis.   Patient was seen by general surgery at [facility] he did not want to intervene surgically.  They wanted to try antibiotics.  I was very skeptical that antibiotics were going to get [place] very far but I i ertapenem with doxycycline June 30, 2022.  In the interim he was hospitalized with an episode of unresponsiveness that was thought to be due to too many sedating medications from home though he is seen Eugene Diaz with cardiology who seems to think the patient had a near syncopal event rather than a drug related overdose.  Patient has had a monitor on since then.  He is also seen Eugene Diaz for his foot but it seems that Eugene Diaz is not aware (based on his notes prior to my last visiit  the patient's recent hospital admission  or MRI.  He has completed IV antibiotics and is currently on oral doxycycline and cefdinir.  His wound is continue to improve and he is continue to follow with Eugene Diaz.  Dr Eugene Diaz did not feel that the MRI findings correlated with exam and that he was skeptical of osteomyelitis.   I had planned on having the patient stop his antibiotics after my last visit provided his inflammatory markers were normal which they were.  Unfortunate that we continue to  take the antibiotics in the interim.  I think now is the time where we can stop them and observe him off antibiotics in the next 2 months.       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[place]) 05/23/2022   Depression    Diabetic neuropathy ([place])    Edema of both lower extremities    Essential tremor    neurologist-- dr patel--- due to alcohol abuse   GERD (gastroesophageal reflux disease)    History of cellulitis 2020   left lower leg;   recurrent 03-25-2020   History of esophageal stricture    s/p  dilatation 02-13-2018   History of osteomyelitis 2020   left great toe     Hypercholesterolemia    Hypertension    followed by pcp  (nuclear stress test 03-11-2014 low risk w/ no ischemia, ef 65%   Normocytic anemia    followed by pcp   (03-18-2020 had transfusion's 02/ 2021)   Osteomyelitis of great toe of right foot ([place]) 08/18/2021   Peripheral vascular disease ([place])    Severe depression ([place]) 06/09/2021   Status post incision and drainage followed by Dr Eugene Diaz and pcp   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   (03-17-2020  pt states most of incision is healed with exception an area that is still draining,  daily dressing change),  foot is red but not warm to the touch and has swelling but has improved)   Suicidal ideation 06/09/2021   Syncope and collapse 06/07/2022   Syncope, near 06/20/2022   Thrombocytopenia ([place])    chronic   Type 2 diabetes mellitus ([place])    followed by pcp---    (03-18-2020  pt stated does not check blood sugar)    Past Surgical History:  Procedure Laterality Date   AMPUTATION Left 10/16/2019   Procedure: Left partial second ray resection; placement of antibiotic beads;  Surgeon: Evelina Bucy, DPM;  Location: [facility];  Service: Podiatry;   Laterality: Left;   AMPUTATION Left 11/13/2019   Procedure: Left Midfoot Amputation - Transmetatarsal vs. Lisfranc; Placement antibiotic beads;  Surgeon: Evelina Bucy, DPM;  Location: [facility];  Service: Podiatry;  Laterality: Left;   AMPUTATION Left 12/21/2019   Procedure: Chopart Amputation left foot;  Surgeon: Evelina Bucy, DPM;  Location: [place];  Service: Podiatry;  Laterality: Left;   AMPUTATION Left 06/24/2020   Procedure: LEFT BELOW KNEE AMPUTATION;  Surgeon: Newt Minion, MD;  Location: [place];  Service: Orthopedics;  Laterality: Left;   AMPUTATION Right 03/29/2021   Procedure: AMPUTATION RAY;  Surgeon: Evelina Bucy, DPM;  Location: [place];  Service: Podiatry;  Laterality: Right;   AMPUTATION TOE Left 08/14/2019   Procedure: AMPUTATION  GREAT TOE;  Surgeon: Evelina Bucy, DPM;  [facility];  Service: Podiatry;  Laterality: Left;   APPLICATION OF WOUND VAC Left 12/21/2019   Procedure: Application Of Wound Vac;  Surgeon: Evelina Bucy, DPM;  Location: [place];  Service: Podiatry;  Laterality: Left;   BIOPSY  02/13/2018   Procedure: BIOPSY;  Surgeon: Danie Binder, MD;  [facility];  Service: Endoscopy;;  transverse colon biopsy, gastric biopsy   COLONOSCOPY WITH PROPOFOL N/A 02/13/2018   Procedure: COLONOSCOPY WITH PROPOFOL;  Surgeon: Danie Binder, MD;  [facility];  Service: Endoscopy;  Laterality: N/A;  11:15am   ESOPHAGOGASTRODUODENOSCOPY (EGD) WITH PROPOFOL N/A 02/13/2018   Procedure: ESOPHAGOGASTRODUODENOSCOPY (EGD) WITH PROPOFOL;  Surgeon: Danie Binder, MD;  [facility];  Service: Endoscopy;  Laterality: N/A;   I & D EXTREMITY Left 07/16/2019   Procedure: Insicion  AND DEBRIDEMENT EXTREMITY;  Surgeon: Evelina Bucy, DPM;  Location: [place];  Service: Podiatry;  Laterality: Left;   I & D EXTREMITY Left 06/22/2020   Procedure: IRRIGATION AND DEBRIDEMENT EXTREMITY, left foot and ankle;  Surgeon: Evelina Bucy, DPM;  Location: [place];  Service: Podiatry;  Laterality: Left;   I & D EXTREMITY Right 03/29/2021   Procedure: IRRIGATION AND DEBRIDEMENT EXTREMITY;  Surgeon: Evelina Bucy, DPM;  Location: [place];  Service: Podiatry;  Laterality: Right;   INCISION AND DRAINAGE OF WOUND Left 10/13/2019   Procedure: IRRIGATION AND DEBRIDEMENT WOUND OF LEFT FOOT AND FIRST METATARSAL RESECTION;  Surgeon: Evelina Bucy, DPM;  [facility];  Service: Podiatry;  Laterality: Left;   IRRIGATION AND DEBRIDEMENT FOOT Left 11/11/2019   Procedure: Left Foot Wound Irrigation and Debridement;  Surgeon: Evelina Bucy, DPM;  [facility];  Service: Podiatry;  Laterality: Left;   Left arm     Left arm repair (tendon and artery)   PERCUTANEOUS PINNING  07/16/2019   Procedure: Open Reduction Percutaneous Pinning Extremity;  Surgeon: Evelina Bucy, DPM;  Location: [place];  Service: Podiatry;;   PILONIDAL CYST EXCISION N/A 08/08/2014   Procedure: CYST EXCISION PILONIDAL EXTENSIVE;  Surgeon: Jamesetta So, MD;  [facility];  Service: General;  Laterality: N/A;   POLYPECTOMY  02/13/2018   Procedure: POLYPECTOMY;  Surgeon: Danie Binder, MD;  [facility];  Service: Endoscopy;;  transverse colon polyp hs, rectal polyps times 2   SAVORY DILATION N/A 02/13/2018   Procedure: SAVORY DILATION;  Surgeon: Danie Binder, MD;  [facility];  Service: Endoscopy;  Laterality: N/A;   TENDON LENGTHENING Right 12/04/2021   Procedure: TENDON LENGTHENING;  Surgeon: Landis Martins, DPM;  Location: [place];  Service: Podiatry;  Laterality: Right;   TRANSMETATARSAL AMPUTATION Right 12/04/2021   Procedure: TRANSMETATARSAL AMPUTATION;  Surgeon: Landis Martins, DPM;  Location: [place];  Service: Podiatry;  Laterality: Right;   WOUND DEBRIDEMENT Left 09/04/2019   Procedure: DEBRIDEMENT WOUND WITH COMPLEX  REPAIR OF DEHISCENCE;  Surgeon: Evelina Bucy, DPM;  Location: [place];  Service: Podiatry;  Laterality: Left;   Leave patient on stretcher   WOUND DEBRIDEMENT Left 10/16/2019   Procedure: Left foot wound debridement and closure;  Surgeon: Evelina Bucy, DPM;  [facility];  Service: Podiatry;  Laterality: Left;   WOUND DEBRIDEMENT Left 12/18/2019   Procedure: LEFT FOOT DEBRIDEMENT WITH PARTIAL INCISION OF INFECTED BONE;  Surgeon: Evelina Bucy, DPM;  Location: [place];  Service: Podiatry;  Laterality: Left;  LEFT FOOT DEBRIDEMENT WITH PARTIAL INCISION OF INFECTED BONE   WOUND DEBRIDEMENT Right 04/01/2021   Procedure: Right foot wound debridement and irrigation and closure, bone biopsy;  Surgeon: Evelina Bucy, DPM;  Location: [place];  Service: Podiatry;  Laterality: Right;    Family History  Problem Relation Age of Onset   Heart attack Mother        Living, 32   Cancer Father        Deceased   Healthy Brother    Healthy Sister    Colon cancer Neg Hx    Colon polyps Neg Hx       Social History   Socioeconomic History   Marital status: Widowed    Spouse name: Not on file   Number of children: 0   Years of education: Not on file   Highest education level: 8th grade  Occupational History   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  Tobacco Use   Smoking status: Some Days    Packs/day: 2.50    Years: 25.00    Total pack years: 62.50    Types: Cigarettes, Cigars    Last attempt to quit: 08/05/1984    Years since quitting: 38.0   Smokeless tobacco: Never   Tobacco comments:    Quit smoking cigarettes; now smokes cigars, wants to quit  Vaping Use   Vaping Use: Never used  Substance and Sexual Activity   Alcohol use: Yes    Alcohol/week: 50.0 standard drinks of alcohol    Types: 50 Standard drinks or equivalent per week    Comment: Drinks 6 beers, liquor 4-6oz (both daily) x 35 years; now states he "drinks a few beers and let it go"   Drug use: Not Currently    Types: Marijuana   Sexual activity: Yes    Birth control/protection: None  Other  Topics Concern   Not on file  Social History Narrative   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 Highest level of education:  8th grade   He lives with wife.  No children.   Left handed   Drinks soda, water-no coffee, no tea   Social Determinants of Health   Financial Resource Strain: Not on file  Food Insecurity: Not on file  Transportation Needs: Not on file  Physical Activity: Not on file  Stress: Not on file  Social Connections: Not on file    No Known Allergies   Current Outpatient Medications:    acetaminophen (TYLENOL) 325 MG tablet, Take 2 tablets (650 mg total) by mouth every 6 (six) hours as needed for mild pain., Disp: , Rfl:    albuterol (VENTOLIN HFA) 108 (90 Base) MCG/ACT inhaler, Inhale 1-2 puffs into the lungs every 4 (four) hours as needed for wheezing or shortness of breath., Disp: , Rfl:    ALPRAZolam (XANAX) 1 MG tablet, Take 1 mg by mouth 4 (four) times daily., Disp: , Rfl:    amLODipine (NORVASC) 10 MG tablet, Take 10 mg by mouth daily., Disp: , Rfl:    aspirin EC 81 MG tablet, Take 81 mg by mouth daily. Swallow whole., Disp: , Rfl:    cefdinir (OMNICEF) 300 MG capsule, Take 1 capsule (300 mg total) by mouth 2 (two) times daily. Start this antibiotic after you have finished your IV antibiotics (Patient not taking: Reported on 07/20/2022), Disp: 60 capsule, Rfl: 5   diclofenac Sodium (VOLTAREN) 1 % GEL, Apply 4 g  topically 4 (four) times daily as needed (Apply to painful area over left upper front chest.)., Disp: 50 g, Rfl: 0   doxycycline (VIBRA-TABS) 100 MG tablet, Take 1 tablet (100 mg total) by mouth 2 (two) times daily. Continue doxycycline after you have finished your IV ceftriaxone, Disp: 60 tablet, Rfl: 5   ertapenem (INVANZ) 1 g injection, , Disp: , Rfl:    folic acid (FOLVITE) 1 MG tablet, Take 1 tablet (1 mg total) by mouth daily., Disp: 30 tablet, Rfl: 0   metFORMIN (GLUCOPHAGE) 500 MG tablet, Take 500 mg by mouth 2  (two) times daily., Disp: , Rfl:    morphine (MS CONTIN) 30 MG 12 hr tablet, Take 30 mg by mouth every 12 (twelve) hours., Disp: , Rfl:    Multiple Vitamin (MULTIVITAMIN WITH MINERALS) TABS tablet, Take 1 tablet by mouth daily. (Patient not taking: Reported on 07/20/2022), Disp: , Rfl:    oxycodone (ROXICODONE) 30 MG immediate release tablet, Take 30 mg by mouth 4 (four) times daily as needed., Disp: , Rfl:    rosuvastatin (CRESTOR) 5 MG tablet, Take 1 tablet (5 mg total) by mouth daily., Disp: 90 tablet, Rfl: 3   thiamine 100 MG tablet, Take 1 tablet (100 mg total) by mouth daily., Disp: 30 tablet, Rfl: 0   Review of Systems  Constitutional:  Negative for activity change, appetite change, chills, diaphoresis, fatigue, fever and unexpected weight change.  HENT:  Negative for congestion, rhinorrhea, sinus pressure, sneezing, sore throat and trouble swallowing.   Eyes:  Negative for photophobia and visual disturbance.  Respiratory:  Negative for cough, chest tightness, shortness of breath, wheezing and stridor.   Cardiovascular:  Negative for chest pain, palpitations and leg swelling.  Gastrointestinal:  Negative for abdominal distention, abdominal pain, anal bleeding, blood in stool, constipation, diarrhea, nausea and vomiting.  Genitourinary:  Negative for difficulty urinating, dysuria, flank pain and hematuria.  Musculoskeletal:  Negative for arthralgias, back pain, gait problem, joint swelling and myalgias.  Skin:  Negative for color change, pallor, rash and wound.  Neurological:  Negative for dizziness, tremors, weakness and light-headedness.  Hematological:  Negative for adenopathy. Does not bruise/bleed easily.  Psychiatric/Behavioral:  Negative for agitation, behavioral problems, confusion, decreased concentration, dysphoric mood and sleep disturbance.        Objective:   Physical Exam Constitutional:      Appearance: He is well-developed.  HENT:     Head: Normocephalic and  atraumatic.  Eyes:     Conjunctiva/sclera: Conjunctivae normal.  Cardiovascular:     Rate and Rhythm: Normal rate and regular rhythm.  Pulmonary:     Effort: Pulmonary effort is normal. No respiratory distress.     Breath sounds: No wheezing.  Abdominal:     General: There is no distension.     Palpations: Abdomen is soft.  Musculoskeletal:        General: No tenderness. Normal range of motion.     Cervical back: Normal range of motion and neck supple.  Skin:    General: Skin is warm and dry.     Coloration: Skin is not pale.     Findings: No erythema or rash.  Neurological:     General: No focal deficit present.     Mental Status: He is alert and oriented to person, place, and time.  Psychiatric:        Mood and Affect: Mood normal.        Behavior: Behavior normal.  Thought Content: Thought content normal.        Judgment: Judgment normal.     Right lower extremity 06/20/2022:    Right foot 07/20/2022:    Foot 08/29/2022:       Assessment & Plan:  Osteomyelitis involving right foot:  Area of amputation is healing well.  There was concern about third through fifth metatarsals on MRI.  Inflammatory markers are reassuring  I will observe him off of antibiotics have him come back in 2 months time unless there is something acute that comes up in the interim.  I would like to make sure he does follow-up with Eugene Diaz closely.  Vaccine counseling recommended COVID-19 and flu shots but he declined.

## 2022-08-31 DIAGNOSIS — Z5181 Encounter for therapeutic drug level monitoring: Secondary | ICD-10-CM | POA: Diagnosis not present

## 2022-08-31 DIAGNOSIS — E785 Hyperlipidemia, unspecified: Secondary | ICD-10-CM | POA: Diagnosis not present

## 2022-09-01 LAB — COMPREHENSIVE METABOLIC PANEL
ALT: 16 IU/L (ref 0–44)
AST: 25 IU/L (ref 0–40)
Albumin/Globulin Ratio: 1.4 (ref 1.2–2.2)
Albumin: 4.4 g/dL (ref 3.8–4.9)
Alkaline Phosphatase: 126 IU/L — ABNORMAL HIGH (ref 44–121)
BUN/Creatinine Ratio: 12 (ref 9–20)
BUN: 11 mg/dL (ref 6–24)
Bilirubin Total: 0.8 mg/dL (ref 0.0–1.2)
CO2: 21 mmol/L (ref 20–29)
Calcium: 9.7 mg/dL (ref 8.7–10.2)
Chloride: 104 mmol/L (ref 96–106)
Creatinine, Ser: 0.95 mg/dL (ref 0.76–1.27)
Globulin, Total: 3.2 g/dL (ref 1.5–4.5)
Glucose: 123 mg/dL — ABNORMAL HIGH (ref 70–99)
Potassium: 5.2 mmol/L (ref 3.5–5.2)
Sodium: 139 mmol/L (ref 134–144)
Total Protein: 7.6 g/dL (ref 6.0–8.5)
eGFR: 93 mL/min/{1.73_m2} (ref >59)

## 2022-09-01 LAB — LIPID PANEL
Chol/HDL Ratio: 4.5 ratio (ref 0.0–5.0)
Cholesterol, Total: 140 mg/dL (ref 100–199)
HDL: 31 mg/dL — ABNORMAL LOW (ref >39)
LDL Chol Calc (NIH): 85 mg/dL (ref 0–99)
Triglycerides: 134 mg/dL (ref 0–149)
VLDL Cholesterol Cal: 24 mg/dL (ref 5–40)

## 2022-09-05 ENCOUNTER — Telehealth (HOSPITAL_BASED_OUTPATIENT_CLINIC_OR_DEPARTMENT_OTHER): Payer: Self-pay

## 2022-09-05 DIAGNOSIS — E785 Hyperlipidemia, unspecified: Secondary | ICD-10-CM

## 2022-09-05 MED ORDER — ROSUVASTATIN CALCIUM 10 MG PO TABS: 10.0000 mg | ORAL_TABLET | Freq: Every day | ORAL | 3 refills | Status: DC

## 2022-09-05 NOTE — Telephone Encounter (Addendum)
Results called to patient who verbalizes understanding! Labs ordered and mailed, prescriptions updated and sent to pharmacy on file .     ----- Message from Loel Dubonnet, NP sent at 09/05/2022  4:05 PM EST ----- Normal kidneys, electrolytes.  Alkaline phosphatase just mildly elevated 126.  Recommend avoidance of fried foods.  Other liver enzymes are normal.  LDL (bad cholesterol) not at goal of less than 70.  Recommend increase rosuvastatin to 10 mg daily with repeat fasting lipid panel and LFTs in 3 months.

## 2022-09-07 ENCOUNTER — Ambulatory Visit (INDEPENDENT_AMBULATORY_CARE_PROVIDER_SITE_OTHER): Payer: Medicare Other | Admitting: Cardiovascular Disease

## 2022-09-07 ENCOUNTER — Encounter (HOSPITAL_BASED_OUTPATIENT_CLINIC_OR_DEPARTMENT_OTHER): Payer: Self-pay | Admitting: Cardiovascular Disease

## 2022-09-07 VITALS — BP 132/72 | HR 91 | Ht 74.0 in | Wt 234.6 lb

## 2022-09-07 DIAGNOSIS — I10 Essential (primary) hypertension: Secondary | ICD-10-CM

## 2022-09-07 DIAGNOSIS — I2510 Atherosclerotic heart disease of native coronary artery without angina pectoris: Secondary | ICD-10-CM | POA: Diagnosis not present

## 2022-09-07 DIAGNOSIS — E1152 Type 2 diabetes mellitus with diabetic peripheral angiopathy with gangrene: Secondary | ICD-10-CM

## 2022-09-07 MED ORDER — LISINOPRIL 10 MG PO TABS: 10.0000 mg | ORAL_TABLET | Freq: Every day | ORAL | 3 refills | Status: DC

## 2022-09-07 NOTE — Progress Notes (Signed)
Cardiology Office Note:    Date:  09/07/2022   ID:  Eugene Diaz, DOB [birth year], MRN [id number]  PCP:  [facility], [place] Providers Cardiologist:  [name], MD     Referring MD: [facility], MD   No chief complaint on file.   History of Present Illness:    Eugene Diaz is a 56 y.o. male with a hx of diabetes, hypertension, hyperlipidemia, tobacco abuse, alcohol abuse, PAD s/p left BKA and right transmetatarsal amputation here for follow up. He was seen in the hospital 02/2022 with chest pain. Coronary CTA revealed mild disease in the LAD, LCX, and RCA. He was seen in the ED yesterday due to an unresponsive episode. He was found down on his porch and there was some concern for narcotic overdose, however in the ED he did not have any opioids on his tox screen. He was positive for THC. He was negative for alcohol. Head CT was unremarkable other than chronic microvascular disease.   At his last visit he was still recovering from his hospitalization.  He had an episode of near syncope.  He wore a 14-day monitor that showed runs of SVT.  He was also doing physical therapy for his right leg.  He had restarted smoking.  He came for fasting lipids which showed that his LDL was not at goal and it was recommended that he increase rosuvastatin.  [name] reports no recent episodes of near syncope.  Overall he has been feeling better. Regarding his leg and foot, the patient had some toes amputated and has a large ulcer that is slowly healing. He underwent wound vac therapy for a couple of weeks under Dr. Serita Grit care. Dr. [name] has been debriding the wound and performing dressings. The patient is currently not taking any antibiotics and is scheduled to see Dr. [name] again in December.  The patient does not check his blood pressures at home. Today's blood pressure reading was slightly elevated at 176/160 systolic. The patient has a history of diabetes and was  previously on Lisinopril 40 mg.   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[place]) 05/23/2022   Depression    Diabetic neuropathy ([place])    Edema of both lower extremities    Essential tremor    neurologist-- dr patel--- due to alcohol abuse   GERD (gastroesophageal reflux disease)    History of cellulitis 2020   left lower leg;   recurrent 03-25-2020   History of esophageal stricture    s/p  dilatation 02-13-2018   History of osteomyelitis 2020   left great toe     Hypercholesterolemia    Hypertension    followed by pcp  (nuclear stress test 03-11-2014 low risk w/ no ischemia, ef 65%   Normocytic anemia    followed by pcp   (03-18-2020 had transfusion's 02/ 2021)   Osteomyelitis of great toe of right foot ([place]) 08/18/2021   Peripheral vascular disease ([place])    Severe depression ([place]) 06/09/2021   Status post incision and drainage followed by Dr [name] and pcp   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   (03-17-2020  pt states most of incision is healed with exception an  area that is still draining,  daily dressing change),  foot is red but not warm to the touch and has swelling but has improved)   Suicidal ideation 06/09/2021   Syncope and collapse 06/07/2022   Syncope, near 06/20/2022   Thrombocytopenia ([place])    chronic   Type 2 diabetes mellitus ([place])    followed by pcp---    (03-18-2020  pt stated does not check blood sugar)   Vaccine counseling 08/29/2022    Past Surgical History:  Procedure Laterality Date   AMPUTATION Left 10/16/2019   Procedure: Left partial second ray resection; placement of antibiotic beads;  Surgeon: Evelina Bucy, DPM;  [facility];  Service: Podiatry;  Laterality: Left;   AMPUTATION Left 11/13/2019   Procedure: Left Midfoot Amputation  - Transmetatarsal vs. Lisfranc; Placement antibiotic beads;  Surgeon: Evelina Bucy, DPM;  [facility];  Service: Podiatry;  Laterality: Left;   AMPUTATION Left 12/21/2019   Procedure: Chopart Amputation left foot;  Surgeon: Evelina Bucy, DPM;  Location: [place];  Service: Podiatry;  Laterality: Left;   AMPUTATION Left 06/24/2020   Procedure: LEFT BELOW KNEE AMPUTATION;  Surgeon: Newt Minion, MD;  Location: [place];  Service: Orthopedics;  Laterality: Left;   AMPUTATION Right 03/29/2021   Procedure: AMPUTATION RAY;  Surgeon: Evelina Bucy, DPM;  Location: [place];  Service: Podiatry;  Laterality: Right;   AMPUTATION TOE Left 08/14/2019   Procedure: AMPUTATION GREAT TOE;  Surgeon: Evelina Bucy, DPM;  [facility];  Service: Podiatry;  Laterality: Left;   APPLICATION OF WOUND VAC Left 12/21/2019   Procedure: Application Of Wound Vac;  Surgeon: Evelina Bucy, DPM;  Location: [place];  Service: Podiatry;  Laterality: Left;   BIOPSY  02/13/2018   Procedure: BIOPSY;  Surgeon: Danie Binder, MD;  [facility];  Service: Endoscopy;;  transverse colon biopsy, gastric biopsy   COLONOSCOPY WITH PROPOFOL N/A 02/13/2018   Procedure: COLONOSCOPY WITH PROPOFOL;  Surgeon: Danie Binder, MD;  [facility];  Service: Endoscopy;  Laterality: N/A;  11:15am   ESOPHAGOGASTRODUODENOSCOPY (EGD) WITH PROPOFOL N/A 02/13/2018   Procedure: ESOPHAGOGASTRODUODENOSCOPY (EGD) WITH PROPOFOL;  Surgeon: Danie Binder, MD;  [facility];  Service: Endoscopy;  Laterality: N/A;   I & D EXTREMITY Left 07/16/2019   Procedure: Insicion  AND DEBRIDEMENT EXTREMITY;  Surgeon: Evelina Bucy, DPM;  Location: [place];  Service: Podiatry;  Laterality: Left;   I & D EXTREMITY Left 06/22/2020   Procedure: IRRIGATION AND DEBRIDEMENT EXTREMITY, left foot and ankle;  Surgeon: Evelina Bucy, DPM;  Location: [place];  Service: Podiatry;  Laterality: Left;   I & D EXTREMITY Right 03/29/2021    Procedure: IRRIGATION AND DEBRIDEMENT EXTREMITY;  Surgeon: Evelina Bucy, DPM;  Location: [place];  Service: Podiatry;  Laterality: Right;   INCISION AND DRAINAGE OF WOUND Left 10/13/2019   Procedure: IRRIGATION AND DEBRIDEMENT WOUND OF LEFT FOOT AND FIRST METATARSAL RESECTION;  Surgeon: Evelina Bucy, DPM;  [facility];  Service: Podiatry;  Laterality: Left;   IRRIGATION AND DEBRIDEMENT FOOT Left 11/11/2019   Procedure: Left Foot Wound Irrigation and Debridement;  Surgeon: Evelina Bucy, DPM;  [facility];  Service: Podiatry;  Laterality: Left;   Left arm     Left arm repair (tendon and artery)   PERCUTANEOUS PINNING  07/16/2019   Procedure: Open Reduction Percutaneous Pinning Extremity;  Surgeon: Evelina Bucy, DPM;  Location: [place];  Service: Podiatry;;   PILONIDAL CYST EXCISION N/A 08/08/2014   Procedure: CYST EXCISION PILONIDAL EXTENSIVE;  Surgeon: Jamesetta So, MD;  [facility];  Service: General;  Laterality: N/A;   POLYPECTOMY  02/13/2018   Procedure: POLYPECTOMY;  Surgeon: Danie Binder, MD;  [facility];  Service: Endoscopy;;  transverse colon polyp hs, rectal polyps times 2   SAVORY DILATION N/A 02/13/2018   Procedure: SAVORY DILATION;  Surgeon: Danie Binder, MD;  [facility];  Service: Endoscopy;  Laterality: N/A;   TENDON LENGTHENING Right 12/04/2021   Procedure: TENDON LENGTHENING;  Surgeon: Landis Martins, DPM;  Location: [place];  Service: Podiatry;  Laterality: Right;   TRANSMETATARSAL AMPUTATION Right 12/04/2021   Procedure: TRANSMETATARSAL AMPUTATION;  Surgeon: Landis Martins, DPM;  Location: [place];  Service: Podiatry;  Laterality: Right;   WOUND DEBRIDEMENT Left 09/04/2019   Procedure: DEBRIDEMENT WOUND WITH COMPLEX  REPAIR OF DEHISCENCE;  Surgeon: Evelina Bucy, DPM;  Location: [place];  Service: Podiatry;  Laterality: Left;  Leave patient on stretcher   WOUND DEBRIDEMENT Left 10/16/2019   Procedure: Left  foot wound debridement and closure;  Surgeon: Evelina Bucy, DPM;  [facility];  Service: Podiatry;  Laterality: Left;   WOUND DEBRIDEMENT Left 12/18/2019   Procedure: LEFT FOOT DEBRIDEMENT WITH PARTIAL INCISION OF INFECTED BONE;  Surgeon: Evelina Bucy, DPM;  Location: [place];  Service: Podiatry;  Laterality: Left;  LEFT FOOT DEBRIDEMENT WITH PARTIAL INCISION OF INFECTED BONE   WOUND DEBRIDEMENT Right 04/01/2021   Procedure: Right foot wound debridement and irrigation and closure, bone biopsy;  Surgeon: Evelina Bucy, DPM;  Location: [place];  Service: Podiatry;  Laterality: Right;    Current Medications: Current Meds  Medication Sig   acetaminophen (TYLENOL) 325 MG tablet Take 2 tablets (650 mg total) by mouth every 6 (six) hours as needed for mild pain.   albuterol (VENTOLIN HFA) 108 (90 Base) MCG/ACT inhaler Inhale 1-2 puffs into the lungs every 4 (four) hours as needed for wheezing or shortness of breath.   ALPRAZolam (XANAX) 1 MG tablet Take 1 mg by mouth 4 (four) times daily.   amLODipine (NORVASC) 10 MG tablet Take 10 mg by mouth daily.   aspirin EC 81 MG tablet Take 81 mg by mouth daily. Swallow whole.   diclofenac Sodium (VOLTAREN) 1 % GEL Apply 4 g topically 4 (four) times daily as needed (Apply to painful area over left upper front chest.).   folic acid (FOLVITE) 1 MG tablet Take 1 tablet (1 mg total) by mouth daily.   lisinopril (ZESTRIL) 10 MG tablet Take 1 tablet (10 mg total) by mouth daily.   metFORMIN (GLUCOPHAGE) 500 MG tablet Take 500 mg by mouth 2 (two) times daily.   morphine (MS CONTIN) 30 MG 12 hr tablet Take 30 mg by mouth every 12 (twelve) hours.   oxycodone (ROXICODONE) 30 MG immediate release tablet Take 30 mg by mouth 4 (four) times daily as needed.   rosuvastatin (CRESTOR) 10 MG tablet Take 1 tablet (10 mg total) by mouth daily.   thiamine 100 MG tablet Take 1 tablet (100 mg total) by mouth daily.     Allergies:   Patient has no known allergies.    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  Tobacco Use   Smoking status: Some Days    Packs/day: 2.50    Years: 25.00    Total pack years: 62.50    Types: Cigarettes, Cigars    Last attempt to quit: 08/05/1984    Years since quitting: 38.1   Smokeless tobacco: Never   Tobacco comments:    Quit smoking cigarettes; now smokes cigars, wants to quit  Vaping Use   Vaping Use: Never used  Substance and Sexual Activity   Alcohol use: Yes    Alcohol/week: 50.0 standard drinks of alcohol    Types: 50 Standard drinks or equivalent per week    Comment: Drinks 6 beers, liquor 4-6oz (both daily) x 35 years; now states he "drinks a few beers and let it go"   Drug use: Not Currently    Types: Marijuana   Sexual activity: Yes    Birth control/protection: None  Other Topics Concern   Not on file  Social History Narrative   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 Highest level of education:  8th grade   He lives with wife.  No children.   Left handed   Drinks soda, water-no coffee, no tea   Social Determinants of Health   Financial Resource Strain: Not on file  Food Insecurity: Not on file  Transportation Needs: Not on file  Physical Activity: Not on file  Stress: Not on file  Social Connections: Not on file     Family History: The patient's family history includes Cancer in his father; Healthy in his brother and sister; Heart attack in his mother. There is no history of Colon cancer or Colon polyps.  ROS:   Please see the history of present illness.    (+) Near syncope (+) Chest discomfort  (+) Right arm/hand weakness  All other systems reviewed and are negative.  EKGs/Labs/Other Studies  Reviewed:    The following studies were reviewed today:  12 Day Zio Monitor 07/2022:   Quality: Fair.  Baseline artifact. Predominant rhythm: sinus rhythm      Average heart rate: 77 bpm Max heart rate: 127 bpm Min heart rate: 49 bpm Pauses >2.5 seconds: none   6 beats NSVT 10 runs of SVT, up to 9 beats at a time    Echo 03/03/2022: 1. Apex is hypokinetic. Left ventricular ejection fraction, by  estimation, is 50 to 55%. The left ventricle has low normal function.  There is moderate left ventricular hypertrophy. Left ventricular diastolic  parameters are consistent with Grade II  diastolic dysfunction (pseudonormalization).   2. Right ventricular systolic function is normal. The right ventricular  size is normal.   3. The mitral valve was not well visualized. No evidence of mitral valve  regurgitation.   4. There is mild calcification of the aortic valve. Aortic valve  regurgitation is not visualized.   5. The inferior vena cava is normal in size with greater than 50%  respiratory variability, suggesting right atrial pressure of 3 mmHg.    CT Coronary Morph 03/02/2022: FINDINGS: Non-cardiac: See separate report from [facility].   Pulmonary veins drain normally to the left atrium. No LA appendage thrombus.   Calcium Score: 94 Agatston units.   Coronary Arteries: Right dominant with no anomalies   LM: No plaque or stenosis.   LAD system: Mixed plaque ostial LAD, mild (1-24%) stenosis. Large D1 with no  significant disease.   Circumflex system: Mixed plaque mid LCx, mild (1-24%) stenosis.   RCA system: Mixed plaque proximal and mid RCA, mild (1-24%) stenosis.   IMPRESSION: 1. Coronary artery calcium score 94 Agatston units. This places the patient in the 74th percentile for age and gender, suggesting intermediate risk for future cardiac events.   2.  Nonobstructive mild CAD.     EKG: EKG is personally reviewed.  06/07/22: EKG was not ordered.    Recent  Labs: 03/02/2022: TSH 0.570 05/22/2022: Magnesium 1.6 07/20/2022: Hemoglobin 16.4; Platelets 199 08/31/2022: ALT 16; BUN 11; Creatinine, Ser 0.95; Potassium 5.2; Sodium 139  Recent Lipid Panel    Component Value Date/Time   CHOL 140 08/31/2022 1052   TRIG 134 08/31/2022 1052   HDL 31 (L) 08/31/2022 1052   CHOLHDL 4.5 08/31/2022 1052   CHOLHDL 4.5 05/24/2022 0534   VLDL 17 05/24/2022 0534   LDLCALC 85 08/31/2022 1052     Risk Assessment/Calculations:           Physical Exam:    VS:  BP 132/72 (BP Location: Left Arm, Patient Position: Sitting, Cuff Size: Normal)   Pulse 91   Ht '6\' 2"'$  (1.88 m)   Wt 234 lb 9.6 oz (106.4 kg)   SpO2 96%   BMI 30.12 kg/m     Wt Readings from Last 3 Encounters:  09/07/22 234 lb 9.6 oz (106.4 kg)  08/29/22 233 lb 12.8 oz (106.1 kg)  07/20/22 238 lb (108 kg)    VS:  BP 132/72 (BP Location: Left Arm, Patient Position: Sitting, Cuff Size: Normal)   Pulse 91   Ht '6\' 2"'$  (1.88 m)   Wt 234 lb 9.6 oz (106.4 kg)   SpO2 96%   BMI 30.12 kg/m  , BMI Body mass index is 30.12 kg/m. GENERAL:  Well appearing.  No acute distress.  HEENT: Pupils equal round and reactive, fundi not visualized, oral mucosa unremarkable NECK:  No jugular venous distention, waveform within normal limits, carotid upstroke brisk and symmetric, no bruits, no thyromegaly LUNGS:  Clear to auscultation bilaterally HEART:  RRR.  PMI not displaced or sustained,S1 and S2 within normal limits, no S3, no S4, no clicks, no rubs, no murmurs ABD:  Flat, positive bowel sounds normal in frequency in pitch, no bruits, no rebound, no guarding, no midline pulsatile mass, no hepatomegaly, no splenomegaly EXT:  Left BKA s/p prosthesis, Right LE boot in place SKIN:  No rashes no nodules NEURO:  Cranial nerves II through XII grossly intact, motor grossly intact throughout PSYCH:  Cognitively intact, oriented to person place and time    ASSESSMENT:    1. CAD in native artery   2. Primary  hypertension   3. Gangrene right third toe associated with type 2 diabetes mellitus ([place]), likely osteomyelitis     PLAN:    In order of problems listed above:  CAD in native artery Nonobstructive CAD on coronary CTA.  He has no ischemia.  Lipids were not at goal but rosuvastatin was recently increased.  His LDL goal is less than 70.  Continue aspirin.  HTN (hypertension) Blood pressure slightly above goal.  Lisinopril was discontinued in the hospital.  He was previously on 40 mg daily.  We will resume at 10 mg daily.  Continue amlodipine.  Gangrene right third toe associated with type 2 diabetes mellitus ([place]), likely osteomyelitis He is no longer on antibiotics and has been cleared by infectious disease.  He for reports that he still has  an ulcer in the plantar surface of his right foot.  He last saw podiatry at the end of September and they were requested 1 week follow-up.  However he thinks he has been released from their care as well.  Recommend that he follow-up with Dr. [name] to ensure that the wound continues to heal adequately.  This is especially important given his left BKA.  ABIs have been normal.     Follow up:  FU with Jaece Ducharme C. Oval Linsey, MD, [facility] in 2-3 months  Medication Adjustments/Labs and Tests Ordered: Current medicines are reviewed at length with the patient today.  Concerns regarding medicines are outlined above.  No orders of the defined types were placed in this encounter.  Meds ordered this encounter  Medications   lisinopril (ZESTRIL) 10 MG tablet    Sig: Take 1 tablet (10 mg total) by mouth daily.    Dispense:  90 tablet    Refill:  3    Patient Instructions  Medication Instructions:  START- Lisinopril 10 mg by mouth daily  *If you need a refill on your cardiac medications before your next appointment, please call your pharmacy*   Lab Work: None Ordered  If you have labs (blood work) drawn today and your tests are completely normal, you will  receive your results only by: [place] (if you have MyChart) OR A paper copy in the mail If you have any lab test that is abnormal or we need to change your treatment, we will call you to review the results.   Testing/Procedures: None Ordered   Follow-Up: At [facility], you and your health needs are our priority.  As part of our continuing mission to provide you with exceptional heart care, we have created designated Provider Care Teams.  These Care Teams include your primary Cardiologist (physician) and Advanced Practice Providers (APPs -  Physician Assistants and Nurse Practitioners) who all work together to provide you with the care you need, when you need it.  We recommend signing up for the patient portal called "MyChart".  Sign up information is provided on this After Visit Summary.  MyChart is used to connect with patients for Virtual Visits (Telemedicine).  Patients are able to view lab/test results, encounter notes, upcoming appointments, etc.  Non-urgent messages can be sent to your provider as well.   To learn more about what you can do with MyChart, go to [email or web address].    Your next appointment:   6 month(s)  The format for your next appointment:   In Person  Provider:   [name], MD    Other Instructions 1 Month with Laurann Montana or Elder Cyphers   CALL Dr [name] Office to schedule Appointment.          Signed, [name], MD  09/07/2022 6:23 PM    [place]

## 2022-09-07 NOTE — Assessment & Plan Note (Signed)
Blood pressure slightly above goal.  Lisinopril was discontinued in the hospital.  He was previously on 40 mg daily.  We will resume at 10 mg daily.  Continue amlodipine.

## 2022-09-07 NOTE — Assessment & Plan Note (Signed)
Nonobstructive CAD on coronary CTA.  He has no ischemia.  Lipids were not at goal but rosuvastatin was recently increased.  His LDL goal is less than 70.  Continue aspirin.

## 2022-09-07 NOTE — Patient Instructions (Addendum)
Medication Instructions:  START- Lisinopril 10 mg by mouth daily  *If you need a refill on your cardiac medications before your next appointment, please call your pharmacy*   Lab Work: None Ordered  If you have labs (blood work) drawn today and your tests are completely normal, you will receive your results only by: [place] (if you have MyChart) OR A paper copy in the mail If you have any lab test that is abnormal or we need to change your treatment, we will call you to review the results.   Testing/Procedures: None Ordered   Follow-Up: At [facility], you and your health needs are our priority.  As part of our continuing mission to provide you with exceptional heart care, we have created designated Provider Care Teams.  These Care Teams include your primary Cardiologist (physician) and Advanced Practice Providers (APPs -  Physician Assistants and Nurse Practitioners) who all work together to provide you with the care you need, when you need it.  We recommend signing up for the patient portal called "MyChart".  Sign up information is provided on this After Visit Summary.  MyChart is used to connect with patients for Virtual Visits (Telemedicine).  Patients are able to view lab/test results, encounter notes, upcoming appointments, etc.  Non-urgent messages can be sent to your provider as well.   To learn more about what you can do with MyChart, go to [email or web address].    Your next appointment:   6 month(s)  The format for your next appointment:   In Person  Provider:   [name], MD    Other Instructions 1 Month with Laurann Montana or Elder Cyphers   CALL Dr [name] Office to schedule Appointment.

## 2022-09-07 NOTE — Assessment & Plan Note (Addendum)
He is no longer on antibiotics and has been cleared by infectious disease.  He for reports that he still has an ulcer in the plantar surface of his right foot.  He last saw podiatry at the end of September and they were requested 1 week follow-up.  However he thinks he has been released from their care as well.  Recommend that he follow-up with Dr. [name] to ensure that the wound continues to heal adequately.  This is especially important given his left BKA.  ABIs have been normal.

## 2022-09-08 ENCOUNTER — Telehealth: Payer: Self-pay | Admitting: *Deleted

## 2022-09-08 ENCOUNTER — Ambulatory Visit (INDEPENDENT_AMBULATORY_CARE_PROVIDER_SITE_OTHER): Payer: Medicare Other | Admitting: Podiatry

## 2022-09-08 DIAGNOSIS — L97512 Non-pressure chronic ulcer of other part of right foot with fat layer exposed: Secondary | ICD-10-CM | POA: Diagnosis not present

## 2022-09-08 DIAGNOSIS — Z794 Long term (current) use of insulin: Secondary | ICD-10-CM

## 2022-09-08 DIAGNOSIS — E118 Type 2 diabetes mellitus with unspecified complications: Secondary | ICD-10-CM | POA: Diagnosis not present

## 2022-09-08 DIAGNOSIS — E1111 Type 2 diabetes mellitus with ketoacidosis with coma: Secondary | ICD-10-CM

## 2022-09-08 NOTE — Telephone Encounter (Signed)
-----   Message from [name], DPM sent at 09/08/2022  3:59 PM EST ----- Regarding: Wound VAC supple eyes Hi Eugene Diaz,   Can you send this patient black sponge supplies he never got up

## 2022-09-08 NOTE — Progress Notes (Signed)
Subjective:  Patient ID: Eugene Diaz, male    DOB: 1966-10-19,  MRN: [id number]  Chief Complaint  Patient presents with   Foot Ulcer    56 y.o. male presents for wound care.  Patient presents with follow-up to right transmetatarsal amputation stump.  He states he ran out of VAC sponge so he went back to regular dressings.  Denies any other acute complaints   Review of Systems: Negative except as noted in the HPI. Denies N/V/F/Ch.  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[place]) 05/23/2022   Depression    Diabetic neuropathy ([place])    Edema of both lower extremities    Essential tremor    neurologist-- dr [place] Kuang--- due to alcohol abuse   GERD (gastroesophageal reflux disease)    History of cellulitis 2020   left lower leg;   recurrent 03-25-2020   History of esophageal stricture    s/p  dilatation 02-13-2018   History of osteomyelitis 2020   left great toe     Hypercholesterolemia    Hypertension    followed by pcp  (nuclear stress test 03-11-2014 low risk w/ no ischemia, ef 65%   Normocytic anemia    followed by pcp   (03-18-2020 had transfusion's 02/ 2021)   Osteomyelitis of great toe of right foot ([place]) 08/18/2021   Peripheral vascular disease ([place])    Severe depression ([place]) 06/09/2021   Status post incision and drainage followed by Dr [name] and pcp   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   (03-17-2020  pt states most of incision is healed with exception an area that is still draining,  daily dressing change),  foot is red but not warm to the touch and has swelling but has improved)   Suicidal ideation 06/09/2021   Syncope and collapse 06/07/2022   Syncope, near 06/20/2022   Thrombocytopenia ([place])    chronic   Type 2 diabetes mellitus ([place])    followed by  pcp---    (03-18-2020  pt stated does not check blood sugar)   Vaccine counseling 08/29/2022    Current Outpatient Medications:    acetaminophen (TYLENOL) 325 MG tablet, Take 2 tablets (650 mg total) by mouth every 6 (six) hours as needed for mild pain., Disp: , Rfl:    albuterol (VENTOLIN HFA) 108 (90 Base) MCG/ACT inhaler, Inhale 1-2 puffs into the lungs every 4 (four) hours as needed for wheezing or shortness of breath., Disp: , Rfl:    ALPRAZolam (XANAX) 1 MG tablet, Take 1 mg by mouth 4 (four) times daily., Disp: , Rfl:    amLODipine (NORVASC) 10 MG tablet, Take 10 mg by mouth daily., Disp: , Rfl:    aspirin EC 81 MG tablet, Take 81 mg by mouth daily. Swallow whole., Disp: , Rfl:    diclofenac Sodium (VOLTAREN) 1 % GEL, Apply 4 g topically 4 (four) times daily as needed (Apply to painful area over left upper front chest.)., Disp: 50 g, Rfl: 0   folic acid (FOLVITE) 1 MG tablet, Take 1 tablet (1 mg total) by mouth daily., Disp: 30 tablet, Rfl: 0   lisinopril (ZESTRIL) 10 MG tablet, Take 1 tablet (10 mg total) by mouth daily., Disp: 90 tablet, Rfl: 3  metFORMIN (GLUCOPHAGE) 500 MG tablet, Take 500 mg by mouth 2 (two) times daily., Disp: , Rfl:    morphine (MS CONTIN) 30 MG 12 hr tablet, Take 30 mg by mouth every 12 (twelve) hours., Disp: , Rfl:    oxycodone (ROXICODONE) 30 MG immediate release tablet, Take 30 mg by mouth 4 (four) times daily as needed., Disp: , Rfl:    rosuvastatin (CRESTOR) 10 MG tablet, Take 1 tablet (10 mg total) by mouth daily., Disp: 90 tablet, Rfl: 3   thiamine 100 MG tablet, Take 1 tablet (100 mg total) by mouth daily., Disp: 30 tablet, Rfl: 0  Social History   Tobacco Use  Smoking Status Some Days   Packs/day: 2.50   Years: 25.00   Total pack years: 62.50   Types: Cigarettes, Cigars   Last attempt to quit: 08/05/1984   Years since quitting: 38.1  Smokeless Tobacco Never  Tobacco Comments   Quit smoking cigarettes; now smokes cigars, wants to quit    No  Known Allergies Objective:  There were no vitals filed for this visit. There is no height or weight on file to calculate BMI. Constitutional Well developed. Well nourished.  Vascular Dorsalis pedis pulses palpable bilaterally. Posterior tibial pulses palpable bilaterally. Capillary refill normal to all digits.  No cyanosis or clubbing noted. Pedal hair growth normal.  Neurologic Normal speech. Oriented to person, place, and time. Protective sensation absent  Dermatologic Wound Location: Right transmetatarsal amputation site stump wound with fat layer exposed.  No purulent drainage noted does not probe down to bone. Wound Base: Mixed Granular/Fibrotic Peri-wound: Calloused Exudate: Scant/small amount Serosanguinous exudate Wound Measurements: -See above  Orthopedic: No pain to palpation either foot.   Radiographs: None Assessment:   1. Ulcer of right foot with fat layer exposed ([place])   2. Type 2 diabetes mellitus with ketoacidosis and coma, with long-term current use of insulin ([place])      Plan:  Patient was evaluated and treated and all questions answered.  Ulcer right transmetatarsal amputation stump wound with fat layer exposed -Debridement as below. -Continue dressed with Betadine wet-to-dry, DSD. -Continue off-loading with cam boot -At this time and clinically not concerning for infection as the wound is not probing down to bone appears to be very superficial without any tunneling.  No correlation with third fourth and fifth metatarsal bone per previous MRI. -Patient ran out of wound VAC and was unable to change the dressing.  I will reach out to the wound VAC representative to get further dressing  Procedure: Excisional Debridement of Wound~stagnant Tool: Sharp chisel blade/tissue nipper Rationale: Removal of non-viable soft tissue from the wound to promote healing.  Anesthesia: none Pre-Debridement Wound Measurements: 1 cm x 0.5 cm x 0.4 cm  Post-Debridement Wound  Measurements: 1.2 cm x 0.6 cm x 0.4 cm  Type of Debridement: Sharp Excisional Tissue Removed: Non-viable soft tissue Blood loss: Minimal (<50cc) Depth of Debridement: subcutaneous tissue. Technique: Sharp excisional debridement to bleeding, viable wound base.  Wound Progress: Also about the same patient ran out of wound VAC was unable to change his dressing Site healing conversation 7 Dressing: Dry, sterile, compression dressing. Disposition: Patient tolerated procedure well. Patient to return in 1 week for follow-up.  Return in about 1 week (around 09/15/2022).   Ulcer about the same ran out of black sponge.  Follow-up with time to go for nursing changes

## 2022-09-08 NOTE — Telephone Encounter (Signed)
Faxed wound care supplies to [facility] 09/08/22.

## 2022-09-12 DIAGNOSIS — G546 Phantom limb syndrome with pain: Secondary | ICD-10-CM | POA: Diagnosis not present

## 2022-09-12 DIAGNOSIS — M150 Primary generalized (osteo)arthritis: Secondary | ICD-10-CM | POA: Diagnosis not present

## 2022-09-12 DIAGNOSIS — E118 Type 2 diabetes mellitus with unspecified complications: Secondary | ICD-10-CM | POA: Diagnosis not present

## 2022-09-12 DIAGNOSIS — I10 Essential (primary) hypertension: Secondary | ICD-10-CM | POA: Diagnosis not present

## 2022-09-12 DIAGNOSIS — E1140 Type 2 diabetes mellitus with diabetic neuropathy, unspecified: Secondary | ICD-10-CM | POA: Diagnosis not present

## 2022-09-12 DIAGNOSIS — Z89512 Acquired absence of left leg below knee: Secondary | ICD-10-CM | POA: Diagnosis not present

## 2022-09-14 DIAGNOSIS — E1140 Type 2 diabetes mellitus with diabetic neuropathy, unspecified: Secondary | ICD-10-CM | POA: Diagnosis not present

## 2022-09-15 ENCOUNTER — Other Ambulatory Visit: Payer: Medicare Other

## 2022-09-28 ENCOUNTER — Ambulatory Visit (INDEPENDENT_AMBULATORY_CARE_PROVIDER_SITE_OTHER): Payer: Medicare Other | Admitting: Podiatry

## 2022-09-28 DIAGNOSIS — L97512 Non-pressure chronic ulcer of other part of right foot with fat layer exposed: Secondary | ICD-10-CM

## 2022-09-28 NOTE — Progress Notes (Signed)
Patient was seen at the clinic for dressing change on the right foot. Patient denies any fever, nausea, vomiting, chills or chest pain at this time. Dr. Sherryle Lis assessed patient ulcer. New orders were obtained to apply iodosorb to the wound followed by [facility]. Iodosorb, Kerracel dressing was applied followed by gauze wrap and secured with ace wrap. Patient tolerated procedure.   Patient will follow up in 2 weeks with Dr. [name].

## 2022-10-07 ENCOUNTER — Ambulatory Visit (HOSPITAL_BASED_OUTPATIENT_CLINIC_OR_DEPARTMENT_OTHER): Payer: Medicare Other | Admitting: Family

## 2022-10-12 ENCOUNTER — Ambulatory Visit: Payer: Medicare Other

## 2022-10-12 ENCOUNTER — Encounter (HOSPITAL_COMMUNITY): Payer: Self-pay | Admitting: Emergency Medicine

## 2022-10-12 ENCOUNTER — Emergency Department (HOSPITAL_COMMUNITY): Payer: Medicare Other

## 2022-10-12 ENCOUNTER — Inpatient Hospital Stay (HOSPITAL_COMMUNITY)
Admission: EM | Admit: 2022-10-12 | Discharge: 2022-10-14 | DRG: 638 | Disposition: A | Discharge status: Home / Self Care | Payer: Medicare Other | Attending: Internal Medicine | Admitting: Internal Medicine

## 2022-10-12 DIAGNOSIS — Z79899 Other long term (current) drug therapy: Secondary | ICD-10-CM

## 2022-10-12 DIAGNOSIS — E7800 Pure hypercholesterolemia, unspecified: Secondary | ICD-10-CM | POA: Diagnosis not present

## 2022-10-12 DIAGNOSIS — Z79891 Long term (current) use of opiate analgesic: Secondary | ICD-10-CM

## 2022-10-12 DIAGNOSIS — Z809 Family history of malignant neoplasm, unspecified: Secondary | ICD-10-CM

## 2022-10-12 DIAGNOSIS — L97512 Non-pressure chronic ulcer of other part of right foot with fat layer exposed: Secondary | ICD-10-CM | POA: Diagnosis not present

## 2022-10-12 DIAGNOSIS — E11621 Type 2 diabetes mellitus with foot ulcer: Secondary | ICD-10-CM | POA: Diagnosis not present

## 2022-10-12 DIAGNOSIS — F17210 Nicotine dependence, cigarettes, uncomplicated: Secondary | ICD-10-CM | POA: Diagnosis present

## 2022-10-12 DIAGNOSIS — E11628 Type 2 diabetes mellitus with other skin complications: Principal | ICD-10-CM | POA: Diagnosis present

## 2022-10-12 DIAGNOSIS — E1140 Type 2 diabetes mellitus with diabetic neuropathy, unspecified: Secondary | ICD-10-CM | POA: Diagnosis present

## 2022-10-12 DIAGNOSIS — L97519 Non-pressure chronic ulcer of other part of right foot with unspecified severity: Secondary | ICD-10-CM | POA: Diagnosis not present

## 2022-10-12 DIAGNOSIS — K219 Gastro-esophageal reflux disease without esophagitis: Secondary | ICD-10-CM | POA: Diagnosis not present

## 2022-10-12 DIAGNOSIS — L03115 Cellulitis of right lower limb: Secondary | ICD-10-CM | POA: Diagnosis present

## 2022-10-12 DIAGNOSIS — J45909 Unspecified asthma, uncomplicated: Secondary | ICD-10-CM | POA: Diagnosis present

## 2022-10-12 DIAGNOSIS — E1151 Type 2 diabetes mellitus with diabetic peripheral angiopathy without gangrene: Secondary | ICD-10-CM | POA: Diagnosis not present

## 2022-10-12 DIAGNOSIS — E669 Obesity, unspecified: Secondary | ICD-10-CM | POA: Diagnosis present

## 2022-10-12 DIAGNOSIS — L089 Local infection of the skin and subcutaneous tissue, unspecified: Secondary | ICD-10-CM | POA: Diagnosis not present

## 2022-10-12 DIAGNOSIS — Z872 Personal history of diseases of the skin and subcutaneous tissue: Secondary | ICD-10-CM | POA: Diagnosis not present

## 2022-10-12 DIAGNOSIS — F1010 Alcohol abuse, uncomplicated: Secondary | ICD-10-CM | POA: Diagnosis present

## 2022-10-12 DIAGNOSIS — Z89431 Acquired absence of right foot: Secondary | ICD-10-CM

## 2022-10-12 DIAGNOSIS — Z8249 Family history of ischemic heart disease and other diseases of the circulatory system: Secondary | ICD-10-CM

## 2022-10-12 DIAGNOSIS — I2510 Atherosclerotic heart disease of native coronary artery without angina pectoris: Secondary | ICD-10-CM | POA: Diagnosis present

## 2022-10-12 DIAGNOSIS — I10 Essential (primary) hypertension: Secondary | ICD-10-CM | POA: Diagnosis not present

## 2022-10-12 DIAGNOSIS — G250 Essential tremor: Secondary | ICD-10-CM | POA: Diagnosis not present

## 2022-10-12 DIAGNOSIS — G8929 Other chronic pain: Secondary | ICD-10-CM | POA: Diagnosis present

## 2022-10-12 DIAGNOSIS — I70229 Atherosclerosis of native arteries of extremities with rest pain, unspecified extremity: Secondary | ICD-10-CM | POA: Diagnosis not present

## 2022-10-12 DIAGNOSIS — E782 Mixed hyperlipidemia: Secondary | ICD-10-CM | POA: Diagnosis not present

## 2022-10-12 DIAGNOSIS — F411 Generalized anxiety disorder: Secondary | ICD-10-CM | POA: Diagnosis present

## 2022-10-12 DIAGNOSIS — E119 Type 2 diabetes mellitus without complications: Secondary | ICD-10-CM | POA: Diagnosis not present

## 2022-10-12 DIAGNOSIS — Z7982 Long term (current) use of aspirin: Secondary | ICD-10-CM

## 2022-10-12 DIAGNOSIS — E785 Hyperlipidemia, unspecified: Secondary | ICD-10-CM | POA: Diagnosis present

## 2022-10-12 DIAGNOSIS — Z7984 Long term (current) use of oral hypoglycemic drugs: Secondary | ICD-10-CM | POA: Diagnosis not present

## 2022-10-12 DIAGNOSIS — S91301A Unspecified open wound, right foot, initial encounter: Secondary | ICD-10-CM | POA: Diagnosis not present

## 2022-10-12 LAB — BASIC METABOLIC PANEL
Anion gap: 10 (ref 5–15)
BUN: 9 mg/dL (ref 6–20)
CO2: 21 mmol/L — ABNORMAL LOW (ref 22–32)
Calcium: 9.2 mg/dL (ref 8.9–10.3)
Chloride: 106 mmol/L (ref 98–111)
Creatinine, Ser: 0.71 mg/dL (ref 0.61–1.24)
GFR, Estimated: >60 mL/min (ref >60)
Glucose, Bld: 114 mg/dL — ABNORMAL HIGH (ref 70–99)
Potassium: 4.2 mmol/L (ref 3.5–5.1)
Sodium: 137 mmol/L (ref 135–145)

## 2022-10-12 LAB — CBC WITH DIFFERENTIAL/PLATELET
Abs Immature Granulocytes: 0.05 10*3/uL (ref 0.00–0.07)
Basophils Absolute: 0.1 10*3/uL (ref 0.0–0.1)
Basophils Relative: 1 %
Eosinophils Absolute: 0.6 10*3/uL — ABNORMAL HIGH (ref 0.0–0.5)
Eosinophils Relative: 6 %
HCT: 48.9 % (ref 39.0–52.0)
Hemoglobin: 16.5 g/dL (ref 13.0–17.0)
Immature Granulocytes: 1 %
Lymphocytes Relative: 33 %
Lymphs Abs: 3.1 10*3/uL (ref 0.7–4.0)
MCH: 31.2 pg (ref 26.0–34.0)
MCHC: 33.7 g/dL (ref 30.0–36.0)
MCV: 92.4 fL (ref 80.0–100.0)
Monocytes Absolute: 0.4 10*3/uL (ref 0.1–1.0)
Monocytes Relative: 4 %
Neutro Abs: 5.3 10*3/uL (ref 1.7–7.7)
Neutrophils Relative %: 55 %
Platelets: 208 10*3/uL (ref 150–400)
RBC: 5.29 MIL/uL (ref 4.22–5.81)
RDW: 15 % (ref 11.5–15.5)
WBC: 9.6 10*3/uL (ref 4.0–10.5)
nRBC: 0 % (ref 0.0–0.2)

## 2022-10-12 LAB — LACTIC ACID, PLASMA
Lactic Acid, Venous: 0.7 mmol/L (ref 0.5–1.9)
Lactic Acid, Venous: 1.5 mmol/L (ref 0.5–1.9)

## 2022-10-12 LAB — C-REACTIVE PROTEIN: CRP: 1.1 mg/dL — ABNORMAL HIGH (ref <1.0)

## 2022-10-12 LAB — MAGNESIUM: Magnesium: 1.9 mg/dL (ref 1.7–2.4)

## 2022-10-12 MED ORDER — NALOXONE HCL 0.4 MG/ML IJ SOLN: 0.4000 mg | INTRAMUSCULAR | Status: DC | PRN

## 2022-10-12 MED ORDER — METRONIDAZOLE 500 MG/100ML IV SOLN
500.0000 mg | Freq: Two times a day (BID) | INTRAVENOUS | Status: DC
  Administered 2022-10-13 – 2022-10-14 (×3): 500 mg via INTRAVENOUS
  Filled 2022-10-12 (×3): qty 100

## 2022-10-12 MED ORDER — FENTANYL CITRATE PF 50 MCG/ML IJ SOSY
50.0000 ug | PREFILLED_SYRINGE | INTRAMUSCULAR | Status: DC | PRN
  Administered 2022-10-13: 50 ug via INTRAVENOUS
  Filled 2022-10-12: qty 1

## 2022-10-12 MED ORDER — ACETAMINOPHEN 650 MG RE SUPP: 650.0000 mg | Freq: Four times a day (QID) | RECTAL | Status: DC | PRN

## 2022-10-12 MED ORDER — ACETAMINOPHEN 325 MG PO TABS: 650.0000 mg | ORAL_TABLET | Freq: Four times a day (QID) | ORAL | Status: DC | PRN

## 2022-10-12 MED ORDER — LORAZEPAM 2 MG/ML IJ SOLN: 1.0000 mg | INTRAMUSCULAR | Status: DC | PRN

## 2022-10-12 MED ORDER — ONDANSETRON HCL 4 MG/2ML IJ SOLN: 4.0000 mg | Freq: Four times a day (QID) | INTRAMUSCULAR | Status: DC | PRN

## 2022-10-12 MED ORDER — VANCOMYCIN HCL 10 G IV SOLR
2500.0000 mg | Freq: Once | INTRAVENOUS | Status: AC
  Administered 2022-10-13: 2500 mg via INTRAVENOUS
  Filled 2022-10-12: qty 2500

## 2022-10-12 MED ORDER — METRONIDAZOLE 500 MG/100ML IV SOLN
500.0000 mg | Freq: Once | INTRAVENOUS | Status: AC
  Administered 2022-10-12: 500 mg via INTRAVENOUS
  Filled 2022-10-12: qty 100

## 2022-10-12 MED ORDER — THIAMINE HCL 100 MG/ML IJ SOLN
100.0000 mg | INTRAMUSCULAR | Status: DC
  Administered 2022-10-13 (×2): 100 mg via INTRAVENOUS
  Filled 2022-10-12 (×2): qty 2

## 2022-10-12 MED ORDER — VANCOMYCIN HCL 1750 MG/350ML IV SOLN
1750.0000 mg | Freq: Two times a day (BID) | INTRAVENOUS | Status: DC
  Filled 2022-10-12: qty 350

## 2022-10-12 MED ORDER — OXYCODONE-ACETAMINOPHEN 5-325 MG PO TABS
1.0000 | ORAL_TABLET | Freq: Once | ORAL | Status: AC
  Administered 2022-10-12: 1 via ORAL
  Filled 2022-10-12: qty 1

## 2022-10-12 MED ORDER — FOLIC ACID 1 MG PO TABS
1.0000 mg | ORAL_TABLET | Freq: Every day | ORAL | Status: DC
  Administered 2022-10-13 – 2022-10-14 (×2): 1 mg via ORAL
  Filled 2022-10-12 (×2): qty 1

## 2022-10-12 MED ORDER — LORAZEPAM 1 MG PO TABS: 1.0000 mg | ORAL_TABLET | ORAL | Status: DC | PRN

## 2022-10-12 MED ORDER — INSULIN ASPART 100 UNIT/ML IJ SOLN
0.0000 [IU] | Freq: Four times a day (QID) | INTRAMUSCULAR | Status: DC
  Administered 2022-10-13: 5 [IU] via SUBCUTANEOUS
  Administered 2022-10-13: 1 [IU] via SUBCUTANEOUS
  Administered 2022-10-14: 2 [IU] via SUBCUTANEOUS

## 2022-10-12 MED ORDER — SODIUM CHLORIDE 0.9 % IV SOLN
2.0000 g | Freq: Three times a day (TID) | INTRAVENOUS | Status: DC
  Administered 2022-10-13 – 2022-10-14 (×5): 2 g via INTRAVENOUS
  Filled 2022-10-12 (×5): qty 12.5

## 2022-10-12 MED ORDER — ADULT MULTIVITAMIN W/MINERALS CH
1.0000 | ORAL_TABLET | Freq: Every day | ORAL | Status: DC
  Administered 2022-10-13 – 2022-10-14 (×2): 1 via ORAL
  Filled 2022-10-12 (×2): qty 1

## 2022-10-12 NOTE — ED Provider Notes (Signed)
[facility] EMERGENCY DEPARTMENT Provider Note   CSN: [id number] Arrival date & time: 10/12/22  1529     History  Chief Complaint  Patient presents with   Wound Infection    Eugene Diaz is a 56 y.o. male with a past medical history significant for diabetes, osteomyelitis of right foot status post transmetatarsal amputation in May by [name], left BKA, depression, alcohol abuse, medication noncompliance, diabetic neuropathy presenting to the emergency room for evaluation of right foot infection.  Patient had R toe amputation back in February.  Patient states he has had worsening pain, erythema, swelling to his R lower leg for the last few weeks.  He went to follow-up with podiatry for dressing change when he was noted to have worsening erythema and swelling around wound site.  He was referred to ED for IV antibiotics.  No fever, chills, chest pain, shortness of breath, bowel changes, urinary symptoms.  HPI    Past Medical History:  Diagnosis Date   Alcohol abuse    Alcoholic peripheral neuropathy ([place])    Anxiety    Arthritis    Asthma    followed by pcp   B12 deficiency    CAD in native artery 06/07/2022   Chronic multifocal osteomyelitis of right foot ([place]) 05/23/2022   Depression    Diabetic neuropathy ([place])    Edema of both lower extremities    Essential tremor    neurologist-- dr patel--- due to alcohol abuse   GERD (gastroesophageal reflux disease)    History of cellulitis 2020   left lower leg;   recurrent 03-25-2020   History of esophageal stricture    s/p  dilatation 02-13-2018   History of osteomyelitis 2020   left great toe     Hypercholesterolemia    Hypertension    followed by pcp  (nuclear stress test 03-11-2014 low risk w/ no ischemia, ef 65%   Normocytic anemia    followed by pcp   (03-18-2020 had transfusion's 02/ 2021)   Osteomyelitis of great toe of right foot ([place]) 08/18/2021   Peripheral vascular disease ([place])    Severe  depression ([place]) 06/09/2021   Status post incision and drainage followed by Dr [name] and pcp   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   (03-17-2020  pt states most of incision is healed with exception an area that is still draining,  daily dressing change),  foot is red but not warm to the touch and has swelling but has improved)   Suicidal ideation 06/09/2021   Syncope and collapse 06/07/2022   Syncope, near 06/20/2022   Thrombocytopenia ([place])    chronic   Type 2 diabetes mellitus ([place])    followed by pcp---    (03-18-2020  pt stated does not check blood sugar)   Vaccine counseling 08/29/2022   Past Surgical History:  Procedure Laterality Date   AMPUTATION Left 10/16/2019   Procedure: Left partial second ray resection; placement of antibiotic beads;  Surgeon: Evelina Bucy, DPM;  [facility]: [facility];  Service: Podiatry;  Laterality: Left;   AMPUTATION Left 11/13/2019   Procedure: Left Midfoot Amputation - Transmetatarsal vs. Lisfranc; Placement antibiotic beads;  Surgeon: Evelina Bucy, DPM;  [facility];  Service: Podiatry;  Laterality: Left;   AMPUTATION Left 12/21/2019   Procedure: Chopart Amputation left foot;  Surgeon: Evelina Bucy, DPM;  Location: [facility];  Service: Podiatry;  Laterality: Left;   AMPUTATION Left 06/24/2020   Procedure: LEFT BELOW KNEE AMPUTATION;  Surgeon: Newt Minion, MD;  Location: [facility];  Service: Orthopedics;  Laterality: Left;   AMPUTATION Right 03/29/2021   Procedure: AMPUTATION RAY;  Surgeon: Evelina Bucy, DPM;  Location: [place];  Service: Podiatry;  Laterality: Right;   AMPUTATION TOE Left 08/14/2019   Procedure: AMPUTATION GREAT TOE;  Surgeon: Evelina Bucy, DPM;  [facility];  Service: Podiatry;  Laterality: Left;   APPLICATION OF WOUND VAC Left 12/21/2019   Procedure: Application Of Wound Vac;  Surgeon: Evelina Bucy, DPM;  Location: [facility];  Service: Podiatry;  Laterality: Left;   BIOPSY  02/13/2018   Procedure: BIOPSY;  Surgeon:  Danie Binder, MD;  [facility];  Service: Endoscopy;;  transverse colon biopsy, gastric biopsy   COLONOSCOPY WITH PROPOFOL N/A 02/13/2018   Procedure: COLONOSCOPY WITH PROPOFOL;  Surgeon: Danie Binder, MD;  [facility];  Service: Endoscopy;  Laterality: N/A;  11:15am   ESOPHAGOGASTRODUODENOSCOPY (EGD) WITH PROPOFOL N/A 02/13/2018   Procedure: ESOPHAGOGASTRODUODENOSCOPY (EGD) WITH PROPOFOL;  Surgeon: Danie Binder, MD;  [facility];  Service: Endoscopy;  Laterality: N/A;   I & D EXTREMITY Left 07/16/2019   Procedure: Insicion  AND DEBRIDEMENT EXTREMITY;  Surgeon: Evelina Bucy, DPM;  Location: [facility];  Service: Podiatry;  Laterality: Left;   I & D EXTREMITY Left 06/22/2020   Procedure: IRRIGATION AND DEBRIDEMENT EXTREMITY, left foot and ankle;  Surgeon: Evelina Bucy, DPM;  Location: [facility];  Service: Podiatry;  Laterality: Left;   I & D EXTREMITY Right 03/29/2021   Procedure: IRRIGATION AND DEBRIDEMENT EXTREMITY;  Surgeon: Evelina Bucy, DPM;  Location: [facility];  Service: Podiatry;  Laterality: Right;   INCISION AND DRAINAGE OF WOUND Left 10/13/2019   Procedure: IRRIGATION AND DEBRIDEMENT WOUND OF LEFT FOOT AND FIRST METATARSAL RESECTION;  Surgeon: Evelina Bucy, DPM;  [facility];  Service: Podiatry;  Laterality: Left;   IRRIGATION AND DEBRIDEMENT FOOT Left 11/11/2019   Procedure: Left Foot Wound Irrigation and Debridement;  Surgeon: Evelina Bucy, DPM;  [facility];  Service: Podiatry;  Laterality: Left;   Left arm     Left arm repair (tendon and artery)   PERCUTANEOUS PINNING  07/16/2019   Procedure: Open Reduction Percutaneous Pinning Extremity;  Surgeon: Evelina Bucy, DPM;  Location: [facility];  Service: Podiatry;;   PILONIDAL CYST EXCISION N/A 08/08/2014   Procedure: CYST EXCISION PILONIDAL EXTENSIVE;  Surgeon: Jamesetta So, MD;  [facility];  Service: General;  Laterality: N/A;   POLYPECTOMY  02/13/2018   Procedure:  POLYPECTOMY;  Surgeon: Danie Binder, MD;  [facility];  Service: Endoscopy;;  transverse colon polyp hs, rectal polyps times 2   SAVORY DILATION N/A 02/13/2018   Procedure: SAVORY DILATION;  Surgeon: Danie Binder, MD;  [facility];  Service: Endoscopy;  Laterality: N/A;   TENDON LENGTHENING Right 12/04/2021   Procedure: TENDON LENGTHENING;  Surgeon: Landis Martins, DPM;  Location: [facility];  Service: Podiatry;  Laterality: Right;   TRANSMETATARSAL AMPUTATION Right 12/04/2021   Procedure: TRANSMETATARSAL AMPUTATION;  Surgeon: Landis Martins, DPM;  Location: [facility];  Service: Podiatry;  Laterality: Right;   WOUND DEBRIDEMENT Left 09/04/2019   Procedure: DEBRIDEMENT WOUND WITH COMPLEX  REPAIR OF DEHISCENCE;  Surgeon: Evelina Bucy, DPM;  Location: [facility];  Service: Podiatry;  Laterality: Left;  Leave patient on stretcher   WOUND DEBRIDEMENT Left 10/16/2019   Procedure: Left foot wound  debridement and closure;  Surgeon: Evelina Bucy, DPM;  [facility];  Service: Podiatry;  Laterality: Left;   WOUND DEBRIDEMENT Left 12/18/2019   Procedure: LEFT FOOT DEBRIDEMENT WITH PARTIAL INCISION OF INFECTED BONE;  Surgeon: Evelina Bucy, DPM;  Location: [facility];  Service: Podiatry;  Laterality: Left;  LEFT FOOT DEBRIDEMENT WITH PARTIAL INCISION OF INFECTED BONE   WOUND DEBRIDEMENT Right 04/01/2021   Procedure: Right foot wound debridement and irrigation and closure, bone biopsy;  Surgeon: Evelina Bucy, DPM;  Location: [place] [facility];  Service: Podiatry;  Laterality: Right;     Home Medications Prior to Admission medications   Medication Sig Start Date End Date Taking? Authorizing Provider  acetaminophen (TYLENOL) 325 MG tablet Take 2 tablets (650 mg total) by mouth every 6 (six) hours as needed for mild pain. 03/03/22   Hongalgi, Lenis Dickinson, MD  albuterol (VENTOLIN HFA) 108 (90 Base) MCG/ACT inhaler Inhale 1-2 puffs into the lungs every 4 (four) hours as needed for  wheezing or shortness of breath. 05/11/22   [provider]  ALPRAZolam Duanne Moron) 1 MG tablet Take 1 mg by mouth 4 (four) times daily. 05/14/22   [provider]  amLODipine (NORVASC) 10 MG tablet Take 10 mg by mouth daily. 05/11/22   [provider]  aspirin EC 81 MG tablet Take 81 mg by mouth daily. Swallow whole.    [provider]  diclofenac Sodium (VOLTAREN) 1 % GEL Apply 4 g topically 4 (four) times daily as needed (Apply to painful area over left upper front chest.). 03/03/22   Hongalgi, Lenis Dickinson, MD  folic acid (FOLVITE) 1 MG tablet Take 1 tablet (1 mg total) by mouth daily. 03/03/22   Hongalgi, Lenis Dickinson, MD  lisinopril (ZESTRIL) 10 MG tablet Take 1 tablet (10 mg total) by mouth daily. 09/07/22 09/02/23  [name], MD  metFORMIN (GLUCOPHAGE) 500 MG tablet Take 500 mg by mouth 2 (two) times daily. 05/11/22   [provider]  morphine (MS CONTIN) 30 MG 12 hr tablet Take 30 mg by mouth every 12 (twelve) hours. 05/14/22   [provider]  oxycodone (ROXICODONE) 30 MG immediate release tablet Take 30 mg by mouth 4 (four) times daily as needed. 05/14/22   [provider]  rosuvastatin (CRESTOR) 10 MG tablet Take 1 tablet (10 mg total) by mouth daily. 09/05/22   Loel Dubonnet, NP  thiamine 100 MG tablet Take 1 tablet (100 mg total) by mouth daily. 03/04/22   Hongalgi, Lenis Dickinson, MD      Allergies    Patient has no known allergies.    Review of Systems   Review of Systems  Physical Exam Updated Vital Signs BP (!) 140/82 (BP Location: Right Arm)   Pulse 82   Temp 98.6 F (37 C) (Oral)   Resp 18   Ht '6\' 2"'$  (1.88 m)   Wt 117.9 kg   SpO2 98%   BMI 33.38 kg/m  Physical Exam Vitals and nursing note reviewed.  Constitutional:      Appearance: Normal appearance.  HENT:     Head: Normocephalic and atraumatic.     Mouth/Throat:     Mouth: Mucous membranes are moist.  Eyes:     General: No scleral icterus. Cardiovascular:     Rate  and Rhythm: Normal rate and regular rhythm.     Pulses: Normal pulses.     Heart sounds: Normal heart sounds.  Pulmonary:     Effort: Pulmonary effort is normal.  Breath sounds: Normal breath sounds.  Abdominal:     General: Abdomen is flat.     Palpations: Abdomen is soft.     Tenderness: There is no abdominal tenderness.  Musculoskeletal:        General: No deformity.  Skin:    General: Skin is warm.     Findings: No rash.     Comments: Swelling, erythema, infected wound on distal amputated R foot.  Neurological:     General: No focal deficit present.     Mental Status: He is alert.  Psychiatric:        Mood and Affect: Mood normal.     ED Results / Procedures / Treatments   Labs (all labs ordered are listed, but only abnormal results are displayed) Labs Reviewed  CBC WITH DIFFERENTIAL/PLATELET - Abnormal; Notable for the following components:      Result Value   Eosinophils Absolute 0.6 (*)    All other components within normal limits  BASIC METABOLIC PANEL - Abnormal; Notable for the following components:   CO2 21 (*)    Glucose, Bld 114 (*)    All other components within normal limits  C-REACTIVE PROTEIN - Abnormal; Notable for the following components:   CRP 1.1 (*)    All other components within normal limits  CULTURE, BLOOD (ROUTINE X 2)  CULTURE, BLOOD (ROUTINE X 2)  LACTIC ACID, PLASMA  LACTIC ACID, PLASMA  MAGNESIUM  SEDIMENTATION RATE  HEPATIC FUNCTION PANEL  BASIC METABOLIC PANEL  CBC  MAGNESIUM  PROTIME-INR    EKG None  Radiology DG Foot Complete Right  Result Date: 10/12/2022 CLINICAL DATA:  Infection. Right foot wound. EXAM: RIGHT FOOT COMPLETE - 3+ VIEW COMPARISON:  Most recent radiograph 05/20/2022 FINDINGS: Previous transmetatarsal amputation of all 5 rays. Heterotopic calcification of the PTA shin site. No erosive change or bony destruction. Skin and soft tissue irregularity about the stump both medial and laterally. Linear density in  the medial foot about the first ray may be related to overlying dressing or soft tissue calcification. No radiopaque foreign body. No soft tissue gas. IMPRESSION: 1. Soft tissue irregularity about the stump of the foot consistent with wound. No radiographic findings of osteomyelitis. 2. Previous transmetatarsal amputation of all 5 rays. Electronically Signed   By: Keith Rake M.D.   On: 10/12/2022 17:19    Procedures Procedures    Medications Ordered in ED Medications  metroNIDAZOLE (FLAGYL) IVPB 500 mg (500 mg Intravenous New Bag/Given 10/12/22 2257)  ceFEPIme (MAXIPIME) 2 g in sodium chloride 0.9 % 100 mL IVPB (has no administration in time range)  vancomycin (VANCOCIN) 2,500 mg in sodium chloride 0.9 % 500 mL IVPB (has no administration in time range)  vancomycin (VANCOREADY) IVPB 1750 mg/350 mL (has no administration in time range)  metroNIDAZOLE (FLAGYL) IVPB 500 mg (has no administration in time range)  acetaminophen (TYLENOL) tablet 650 mg (has no administration in time range)    Or  acetaminophen (TYLENOL) suppository 650 mg (has no administration in time range)  naloxone (NARCAN) injection 0.4 mg (has no administration in time range)  fentaNYL (SUBLIMAZE) injection 50 mcg (has no administration in time range)  ondansetron (ZOFRAN) injection 4 mg (has no administration in time range)  oxyCODONE-acetaminophen (PERCOCET/ROXICET) 5-325 MG per tablet 1 tablet (1 tablet Oral Given 10/12/22 2253)    ED Course/ Medical Decision Making/ A&P  Medical Decision Making Amount and/or Complexity of Data Reviewed Labs: ordered. Radiology: ordered.  Risk Prescription drug management. Decision regarding hospitalization.   This patient presents to the ED for right foot wound infection, this involves an extensive number of treatment options, and is a complaint that carries with a high risk of complications and morbidity.  The differential diagnosis includes  cellulitis, osteomyelitis, soft tissue infection, sepsis, infectious etiology.  This is not an exhaustive list.  Comorbidities that complicate the patient evaluation See HPI  Social determinants of health NA  Additional history obtained: External records from outside source obtained and reviewed including: Chart review including previous notes, labs, imaging.  Cardiac monitoring/EKG: The patient was maintained on a cardiac monitor.  I personally reviewed and interpreted the cardiac monitor which showed an underlying rhythm of: Sinus rhythm.  Lab tests: I ordered and personally interpreted labs.  The pertinent results include: WBC unremarkable. Hbg unremarkable. Platelets unremarkable. No electrolyte abnormalities noted. BUN, creatinine unremarkable.   Imaging studies: I ordered imaging studies. I personally reviewed, interpreted imaging and agree with the radiologist's interpretations. Findings include: X-rays showed no evidence of osteomyelitis of the right foot.  MRI foot pending.  Problem list/ ED course/ Critical interventions/ Medical management: HPI: See above Vital signs within normal range and stable throughout visit. Laboratory/imaging studies significant for: See above. On physical examination, patient is afebrile and appears in no acute distress.  Wound on the distal right first thumb looks swollen, erythematous and infected.  Laboratory study with no leukocytosis or anemia.  No abnormal electrolyte noted.  X-ray showed no evidence of osteomyelitis of the right foot.  MRI R foot pending.  Cannot rule out osteomyelitis of the right foot at this point.  Based on patient's clinical presentations and laboratory/imaging studies I suspect wound infection versus osteomyelitis versus cellulitis.  I ordered broad-spectrum antibiotics including vancomycin, ceftriaxone, Flagyl.  I ordered Percocet for pain.  Admission plan discussed with patient. I have reviewed the patient home  medicines and have made adjustments as needed.  Consultations obtained: I requested consultation with podiatry Dr. Loel Lofty, and discussed lab and imaging findings as well as pertinent plan.  He recommended broad-spectrum antibiotics, [name] study.  He will round the patient in the morning. I requested consultation with Dr. Velia Meyer, and discussed lab and imaging findings as well as pertinent plan.  They recommended admission to the hospital.  Disposition Patient is admitted to the hospital for further evaluation and management. This chart was dictated using voice recognition software.  Despite best efforts to proofread,  errors can occur which can change the documentation meaning.          Final Clinical Impression(s) / ED Diagnoses Final diagnoses:  Right foot infection    Rx / DC Orders ED Discharge Orders     None         Rex Kras, [place] 10/13/22 1023    Godfrey Pick, MD 10/18/22 [phone]

## 2022-10-12 NOTE — Progress Notes (Signed)
Pharmacy Antibiotic Note  Eugene Diaz is a 56 y.o. male admitted on 10/12/2022 with  wound infection .  Pharmacy has been consulted for Cefepime and Vancomycin dosing.  WBC 9.6, Tmax 98.7, LA 0.7 SCr 0.71  Pt received metronidazole '500mg'$  IV x1 at 22:57 on 10/12/22 in the ED  Plan: Initiate Cefepime 2g IV q8h  Initiate loading dose of Vancomycin '2500mg'$  IV x 1, followed by  Vancomycin '1750mg'$  IV q12h (eAUC ~516)    > Goal AUC 400-550    > Check vancomycin levels at steady state Monitor daily CBC, temp, SCr, and for clinical signs of improvement  F/u cultures and de-escalate antibiotics as able    Height: '6\' 2"'$  (188 cm) Weight: 117.9 kg (260 lb) IBW/kg (Calculated) : 82.2  Temp (24hrs), Avg:98.4 F (36.9 C), Min:98.1 F (36.7 C), Max:98.7 F (37.1 C)  Recent Labs  Lab 10/12/22 1641  WBC 9.6  CREATININE 0.71  LATICACIDVEN 0.7    Estimated Creatinine Clearance: 139.1 mL/min (by C-G formula based on SCr of 0.71 mg/dL).    No Known Allergies  Antimicrobials this admission: Metronidazole x1 12/13 Cefepime 12/13 >>  Vancomycin 12/13 >>   Dose adjustments this admission: N/A  Microbiology results: 12/13 BCx: pending  Thank you for allowing pharmacy to be a part of this patient's care.  Luisa Hart, PharmD, BCPS Clinical Pharmacist 10/12/2022 11:01 PM   Please refer to [facility] for pharmacy phone number

## 2022-10-12 NOTE — ED Triage Notes (Signed)
[facility]x for right foot wound. Pt had toes amputated early this year and has been having dressing changes since. Wound is red and warm to touch per doc notes.

## 2022-10-12 NOTE — ED Provider Triage Note (Signed)
Emergency Medicine Provider Triage Evaluation Note  Eugene Diaz , a 56 y.o. male  was evaluated in triage.  Pt complains of Wound infection to the right foot.  History of amputation on this foot.  He went to follow-up with podiatry for dressing change when he was noted to have worsening erythema and wound.  He was referred to ED for IV antibiotics.  He denies fever, chills, or other complaints\.  Review of Systems  Positive: As above Negative: As above  Physical Exam  BP 134/85 (BP Location: Right Arm)   Pulse 82   Temp 98.1 F (36.7 C)   Resp 16   Ht '6\' 2"'$  (1.88 m)   Wt 117.9 kg   SpO2 97%   BMI 33.38 kg/m  Gen:   Awake, no distress   Resp:  Normal effort  MSK:   Moves extremities without difficulty  Other:  Pictures are under the media tab.  Attached below.  1 is labeled left foot.  This is a typo and both are of the right foot.      Medical Decision Making  Medically screening exam initiated at 4:30 PM.  Appropriate orders placed.  KOY LAMP was informed that the remainder of the evaluation will be completed by another provider, this initial triage assessment does not replace that evaluation, and the importance of remaining in the ED until their evaluation is complete.     Evlyn Courier, PA-C 10/12/22 1642

## 2022-10-12 NOTE — Progress Notes (Signed)
Patient was seen in office today dressing change of the right foot. Patient denies any fever chills, nausea or vomiting. Dr.Patel was video called during the visit to address the appearance of the wound. The wound was red and warm to the touch. Dr. [name] advised the patient to go to the ED for IV antibiotics at this time. Patient voiced understanding. Iodosorb  was applied to a non stick dressing and wrapped with gauze and was secured with an ace wrap. Patient tolerated the procedure well.  Patient will follow up in 1 week with Dr.Patel

## 2022-10-12 NOTE — H&P (Incomplete)
History and Physical      Eugene Diaz NIO:[id number] DOB: [birth year] DOA: 10/12/2022  PCP: Redmond School, MD *** Patient coming from: home ***  I have personally briefly reviewed patient's old medical records in [place]  Chief Complaint: ***  HPI: Eugene Diaz is a 56 y.o. male with medical history significant for *** who is admitted to [facility] on 10/12/2022 with *** after presenting from home*** to [facility] ED complaining of ***.    ***       ***   ED Course:  Vital signs in the ED were notable for the following: ***  Labs were notable for the following: ***  Per my interpretation, EKG in ED demonstrated the following:  ***  Imaging and additional notable ED work-up: ***  While in the ED, the following were administered: ***  Subsequently, the patient was admitted  ***  ***red    Review of Systems: As per HPI otherwise 10 point review of systems negative.   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[place]) 05/23/2022   Depression    Diabetic neuropathy ([place])    Edema of both lower extremities    Essential tremor    neurologist-- dr patel--- due to alcohol abuse   GERD (gastroesophageal reflux disease)    History of cellulitis 2020   left lower leg;   recurrent 03-25-2020   History of esophageal stricture    s/p  dilatation 02-13-2018   History of osteomyelitis 2020   left great toe     Hypercholesterolemia    Hypertension    followed by pcp  (nuclear stress test 03-11-2014 low risk w/ no ischemia, ef 65%   Normocytic anemia    followed by pcp   (03-18-2020 had transfusion's 02/ 2021)   Osteomyelitis of great toe of right foot ([place]) 08/18/2021   Peripheral vascular disease ([place])    Severe depression ([place]) 06/09/2021    Status post incision and drainage followed by Dr [name] and pcp   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   (03-17-2020  pt states most of incision is healed with exception an area that is still draining,  daily dressing change),  foot is red but not warm to the touch and has swelling but has improved)   Suicidal ideation 06/09/2021   Syncope and collapse 06/07/2022   Syncope, near 06/20/2022   Thrombocytopenia ([place])    chronic   Type 2 diabetes mellitus ([place])    followed by pcp---    (03-18-2020  pt stated does not check blood sugar)   Vaccine counseling 08/29/2022    Past Surgical History:  Procedure Laterality Date   AMPUTATION Left 10/16/2019   Procedure: Left partial second ray resection; placement of antibiotic beads;  Surgeon: Evelina Bucy, DPM;  [facility];  Service: Podiatry;  Laterality: Left;   AMPUTATION Left 11/13/2019   Procedure: Left Midfoot Amputation - Transmetatarsal vs. Lisfranc; Placement antibiotic beads;  Surgeon: Evelina Bucy, DPM;  [facility];  Service: Podiatry;  Laterality: Left;   AMPUTATION Left 12/21/2019   Procedure: Chopart Amputation left foot;  Surgeon: Evelina Bucy, DPM;  Location: [place];  Service: Podiatry;  Laterality: Left;  AMPUTATION Left 06/24/2020   Procedure: LEFT BELOW KNEE AMPUTATION;  Surgeon: Newt Minion, MD;  Location: [place];  Service: Orthopedics;  Laterality: Left;   AMPUTATION Right 03/29/2021   Procedure: AMPUTATION RAY;  Surgeon: Evelina Bucy, DPM;  Location: [place];  Service: Podiatry;  Laterality: Right;   AMPUTATION TOE Left 08/14/2019   Procedure: AMPUTATION GREAT TOE;  Surgeon: Evelina Bucy, DPM;  [facility];  Service: Podiatry;  Laterality: Left;   APPLICATION OF WOUND VAC Left 12/21/2019   Procedure: Application Of Wound Vac;  Surgeon: Evelina Bucy, DPM;  Location: [place];  Service: Podiatry;  Laterality: Left;   BIOPSY  02/13/2018   Procedure: BIOPSY;  Surgeon: Danie Binder, MD;   [facility];  Service: Endoscopy;;  transverse colon biopsy, gastric biopsy   COLONOSCOPY WITH PROPOFOL N/A 02/13/2018   Procedure: COLONOSCOPY WITH PROPOFOL;  Surgeon: Danie Binder, MD;  [facility];  Service: Endoscopy;  Laterality: N/A;  11:15am   ESOPHAGOGASTRODUODENOSCOPY (EGD) WITH PROPOFOL N/A 02/13/2018   Procedure: ESOPHAGOGASTRODUODENOSCOPY (EGD) WITH PROPOFOL;  Surgeon: Danie Binder, MD;  [facility];  Service: Endoscopy;  Laterality: N/A;   I & D EXTREMITY Left 07/16/2019   Procedure: Insicion  AND DEBRIDEMENT EXTREMITY;  Surgeon: Evelina Bucy, DPM;  Location: [place];  Service: Podiatry;  Laterality: Left;   I & D EXTREMITY Left 06/22/2020   Procedure: IRRIGATION AND DEBRIDEMENT EXTREMITY, left foot and ankle;  Surgeon: Evelina Bucy, DPM;  Location: [place];  Service: Podiatry;  Laterality: Left;   I & D EXTREMITY Right 03/29/2021   Procedure: IRRIGATION AND DEBRIDEMENT EXTREMITY;  Surgeon: Evelina Bucy, DPM;  Location: [place];  Service: Podiatry;  Laterality: Right;   INCISION AND DRAINAGE OF WOUND Left 10/13/2019   Procedure: IRRIGATION AND DEBRIDEMENT WOUND OF LEFT FOOT AND FIRST METATARSAL RESECTION;  Surgeon: Evelina Bucy, DPM;  [facility];  Service: Podiatry;  Laterality: Left;   IRRIGATION AND DEBRIDEMENT FOOT Left 11/11/2019   Procedure: Left Foot Wound Irrigation and Debridement;  Surgeon: Evelina Bucy, DPM;  [facility];  Service: Podiatry;  Laterality: Left;   Left arm     Left arm repair (tendon and artery)   PERCUTANEOUS PINNING  07/16/2019   Procedure: Open Reduction Percutaneous Pinning Extremity;  Surgeon: Evelina Bucy, DPM;  Location: [place];  Service: Podiatry;;   PILONIDAL CYST EXCISION N/A 08/08/2014   Procedure: CYST EXCISION PILONIDAL EXTENSIVE;  Surgeon: Jamesetta So, MD;  [facility];  Service: General;  Laterality: N/A;   POLYPECTOMY  02/13/2018   Procedure: POLYPECTOMY;  Surgeon: Danie Binder, MD;  [facility];  Service: Endoscopy;;  transverse colon polyp hs, rectal polyps times 2   SAVORY DILATION N/A 02/13/2018   Procedure: SAVORY DILATION;  Surgeon: Danie Binder, MD;  [facility];  Service: Endoscopy;  Laterality: N/A;   TENDON LENGTHENING Right 12/04/2021   Procedure: TENDON LENGTHENING;  Surgeon: Landis Martins, DPM;  Location: [place];  Service: Podiatry;  Laterality: Right;   TRANSMETATARSAL AMPUTATION Right 12/04/2021   Procedure: TRANSMETATARSAL AMPUTATION;  Surgeon: Landis Martins, DPM;  Location: [place];  Service: Podiatry;  Laterality: Right;   WOUND DEBRIDEMENT Left 09/04/2019   Procedure: DEBRIDEMENT WOUND WITH COMPLEX  REPAIR OF DEHISCENCE;  Surgeon: Evelina Bucy, DPM;  Location: [place];  Service: Podiatry;  Laterality: Left;  Leave patient on stretcher  WOUND DEBRIDEMENT Left 10/16/2019   Procedure: Left foot wound debridement and closure;  Surgeon: Evelina Bucy, DPM;  [facility];  Service: Podiatry;  Laterality: Left;   WOUND DEBRIDEMENT Left 12/18/2019   Procedure: LEFT FOOT DEBRIDEMENT WITH PARTIAL INCISION OF INFECTED BONE;  Surgeon: Evelina Bucy, DPM;  Location: [place];  Service: Podiatry;  Laterality: Left;  LEFT FOOT DEBRIDEMENT WITH PARTIAL INCISION OF INFECTED BONE   WOUND DEBRIDEMENT Right 04/01/2021   Procedure: Right foot wound debridement and irrigation and closure, bone biopsy;  Surgeon: Evelina Bucy, DPM;  Location: [place];  Service: Podiatry;  Laterality: Right;    Social History:  reports that he has been smoking cigarettes and cigars. He has a 62.50 pack-year smoking history. He has never used smokeless tobacco. He reports current alcohol use of about 50.0 standard drinks of alcohol per week. He reports that he does not currently use drugs after having used the following drugs: Marijuana.   No Known Allergies  Family History  Problem Relation Age of Onset   Heart attack Mother         Living, 24   Cancer Father        Deceased   Healthy Brother    Healthy Sister    Colon cancer Neg Hx    Colon polyps Neg Hx     Family history reviewed and not pertinent ***   Prior to Admission medications   Medication Sig Start Date End Date Taking? Authorizing Provider  acetaminophen (TYLENOL) 325 MG tablet Take 2 tablets (650 mg total) by mouth every 6 (six) hours as needed for mild pain. 03/03/22   Hongalgi, Lenis Dickinson, MD  albuterol (VENTOLIN HFA) 108 (90 Base) MCG/ACT inhaler Inhale 1-2 puffs into the lungs every 4 (four) hours as needed for wheezing or shortness of breath. 05/11/22   [provider]  ALPRAZolam Duanne Moron) 1 MG tablet Take 1 mg by mouth 4 (four) times daily. 05/14/22   [provider]  amLODipine (NORVASC) 10 MG tablet Take 10 mg by mouth daily. 05/11/22   [provider]  aspirin EC 81 MG tablet Take 81 mg by mouth daily. Swallow whole.    [provider]  diclofenac Sodium (VOLTAREN) 1 % GEL Apply 4 g topically 4 (four) times daily as needed (Apply to painful area over left upper front chest.). 03/03/22   Hongalgi, Lenis Dickinson, MD  folic acid (FOLVITE) 1 MG tablet Take 1 tablet (1 mg total) by mouth daily. 03/03/22   Hongalgi, Lenis Dickinson, MD  lisinopril (ZESTRIL) 10 MG tablet Take 1 tablet (10 mg total) by mouth daily. 09/07/22 09/02/23  [name], MD  metFORMIN (GLUCOPHAGE) 500 MG tablet Take 500 mg by mouth 2 (two) times daily. 05/11/22   [provider]  morphine (MS CONTIN) 30 MG 12 hr tablet Take 30 mg by mouth every 12 (twelve) hours. 05/14/22   [provider]  oxycodone (ROXICODONE) 30 MG immediate release tablet Take 30 mg by mouth 4 (four) times daily as needed. 05/14/22   [provider]  rosuvastatin (CRESTOR) 10 MG tablet Take 1 tablet (10 mg total) by mouth daily. 09/05/22   Loel Dubonnet, NP  thiamine 100 MG tablet Take 1 tablet (100 mg total) by mouth daily. 03/04/22   Modena Jansky, MD      Objective    Physical Exam: Vitals:   10/12/22 1558 10/12/22 1728 10/12/22 1821 10/12/22 2301  BP:  139/76 (!) 145/76 (!) 140/82  Pulse:  77 80 82  Resp:  '16 16 18  '$ Temp:  98.7 F (37.1 C)  98.6 F (37 C)  TempSrc:    Oral  SpO2:  98% 97% 98%  Weight: 117.9 kg     Height: '6\' 2"'$  (1.88 m)       General: appears to be stated age; alert, oriented Skin: warm, dry, no rash Head:  AT/[place] Mouth:  Oral mucosa membranes appear moist, normal dentition Neck: supple; trachea midline Heart:  RRR; did not appreciate any M/R/G Lungs: CTAB, did not appreciate any wheezes, rales, or rhonchi Abdomen: + BS; soft, ND, NT Vascular: 2+ pedal pulses b/l; 2+ radial pulses b/l Extremities: no peripheral edema, no muscle wasting Neuro: strength and sensation intact in upper and lower extremities b/l ***   *** Neuro: 5/5 strength of the proximal and distal flexors and extensors of the upper and lower extremities bilaterally; sensation intact in upper and lower extremities b/l; cranial nerves II through XII grossly intact; no pronator drift; no evidence suggestive of slurred speech, dysarthria, or facial droop; Normal muscle tone. No tremors.  *** Neuro: In the setting of the patient's current mental status and associated inability to follow instructions, unable to perform full neurologic exam at this time.  As such, assessment of strength, sensation, and cranial nerves is limited at this time. Patient noted to spontaneously move all 4 extremities. No tremors.  ***    Labs on Admission: I have personally reviewed following labs and imaging studies  CBC: Recent Labs  Lab 10/12/22 1641  WBC 9.6  NEUTROABS 5.3  HGB 16.5  HCT 48.9  MCV 92.4  PLT 831   Basic Metabolic Panel: Recent Labs  Lab 10/12/22 1641 10/12/22 2225  NA 137  --   K 4.2  --   CL 106  --   CO2 21*  --   GLUCOSE 114*  --   BUN 9  --   CREATININE 0.71  --   CALCIUM 9.2  --   MG  --  1.9   GFR: Estimated  Creatinine Clearance: 139.1 mL/min (by C-G formula based on SCr of 0.71 mg/dL). Liver Function Tests: No results for input(s): "AST", "ALT", "ALKPHOS", "BILITOT", "PROT", "ALBUMIN" in the last 168 hours. No results for input(s): "LIPASE", "AMYLASE" in the last 168 hours. No results for input(s): "AMMONIA" in the last 168 hours. Coagulation Profile: No results for input(s): "INR", "PROTIME" in the last 168 hours. Cardiac Enzymes: No results for input(s): "CKTOTAL", "CKMB", "CKMBINDEX", "TROPONINI" in the last 168 hours. BNP (last 3 results) No results for input(s): "PROBNP" in the last 8760 hours. HbA1C: No results for input(s): "HGBA1C" in the last 72 hours. CBG: No results for input(s): "GLUCAP" in the last 168 hours. Lipid Profile: No results for input(s): "CHOL", "HDL", "LDLCALC", "TRIG", "CHOLHDL", "LDLDIRECT" in the last 72 hours. Thyroid Function Tests: No results for input(s): "TSH", "T4TOTAL", "FREET4", "T3FREE", "THYROIDAB" in the last 72 hours. Anemia Panel: No results for input(s): "VITAMINB12", "FOLATE", "FERRITIN", "TIBC", "IRON", "RETICCTPCT" in the last 72 hours. Urine analysis:    Component Value Date/Time   COLORURINE STRAW (A) 03/29/2021 [place] 03/29/2021 1705   LABSPEC 1.010 03/29/2021 1705   PHURINE 6.0 03/29/2021 1705   GLUCOSEU NEGATIVE 03/29/2021 1705   HGBUR MODERATE (A) 03/29/2021 1705   BILIRUBINUR NEGATIVE 03/29/2021 [place] 03/29/2021 1705   PROTEINUR NEGATIVE 03/29/2021 1705   UROBILINOGEN 4.0 (H) 10/09/2008 1114   NITRITE NEGATIVE 03/29/2021 1705  LEUKOCYTESUR NEGATIVE 03/29/2021 1705    Radiological Exams on Admission: DG Foot Complete Right  Result Date: 10/12/2022 CLINICAL DATA:  Infection. Right foot wound. EXAM: RIGHT FOOT COMPLETE - 3+ VIEW COMPARISON:  Most recent radiograph 05/20/2022 FINDINGS: Previous transmetatarsal amputation of all 5 rays. Heterotopic calcification of the PTA shin site. No  erosive change or bony destruction. Skin and soft tissue irregularity about the stump both medial and laterally. Linear density in the medial foot about the first ray may be related to overlying dressing or soft tissue calcification. No radiopaque foreign body. No soft tissue gas. IMPRESSION: 1. Soft tissue irregularity about the stump of the foot consistent with wound. No radiographic findings of osteomyelitis. 2. Previous transmetatarsal amputation of all 5 rays. Electronically Signed   By: Keith Rake M.D.   On: 10/12/2022 17:19      Assessment/Plan   Principal Problem:   Cellulitis of right foot   ***       ***            ***             ***            ***            ***            ***             ***           ***           ***   ***  DVT prophylaxis: SCD's ***  Code Status: Full code*** Family Communication: none*** Disposition Plan: Per Rounding Team Consults called: none***;  Admission status: ***     I SPENT GREATER THAN 75 *** MINUTES IN CLINICAL CARE TIME/MEDICAL DECISION-MAKING IN COMPLETING THIS ADMISSION.      [place] DO [facility]  From [place]   10/12/2022, 11:17 PM   ***

## 2022-10-13 ENCOUNTER — Inpatient Hospital Stay (HOSPITAL_COMMUNITY): Payer: Medicare Other

## 2022-10-13 ENCOUNTER — Other Ambulatory Visit: Payer: Self-pay

## 2022-10-13 DIAGNOSIS — L97512 Non-pressure chronic ulcer of other part of right foot with fat layer exposed: Secondary | ICD-10-CM

## 2022-10-13 DIAGNOSIS — F411 Generalized anxiety disorder: Secondary | ICD-10-CM | POA: Diagnosis present

## 2022-10-13 DIAGNOSIS — L03115 Cellulitis of right lower limb: Secondary | ICD-10-CM

## 2022-10-13 DIAGNOSIS — I70229 Atherosclerosis of native arteries of extremities with rest pain, unspecified extremity: Secondary | ICD-10-CM

## 2022-10-13 LAB — CBC
HCT: 47.4 % (ref 39.0–52.0)
Hemoglobin: 15.8 g/dL (ref 13.0–17.0)
MCH: 31.5 pg (ref 26.0–34.0)
MCHC: 33.3 g/dL (ref 30.0–36.0)
MCV: 94.4 fL (ref 80.0–100.0)
Platelets: 188 10*3/uL (ref 150–400)
RBC: 5.02 MIL/uL (ref 4.22–5.81)
RDW: 14.9 % (ref 11.5–15.5)
WBC: 6.8 10*3/uL (ref 4.0–10.5)
nRBC: 0 % (ref 0.0–0.2)

## 2022-10-13 LAB — GLUCOSE, CAPILLARY
Glucose-Capillary: 114 mg/dL — ABNORMAL HIGH (ref 70–99)
Glucose-Capillary: 139 mg/dL — ABNORMAL HIGH (ref 70–99)
Glucose-Capillary: 197 mg/dL — ABNORMAL HIGH (ref 70–99)
Glucose-Capillary: 251 mg/dL — ABNORMAL HIGH (ref 70–99)

## 2022-10-13 LAB — SEDIMENTATION RATE: Sed Rate: 11 mm/hr (ref 0–16)

## 2022-10-13 LAB — BASIC METABOLIC PANEL
Anion gap: 9 (ref 5–15)
BUN: 9 mg/dL (ref 6–20)
CO2: 24 mmol/L (ref 22–32)
Calcium: 9.1 mg/dL (ref 8.9–10.3)
Chloride: 104 mmol/L (ref 98–111)
Creatinine, Ser: 0.8 mg/dL (ref 0.61–1.24)
GFR, Estimated: >60 mL/min (ref >60)
Glucose, Bld: 112 mg/dL — ABNORMAL HIGH (ref 70–99)
Potassium: 4.2 mmol/L (ref 3.5–5.1)
Sodium: 137 mmol/L (ref 135–145)

## 2022-10-13 LAB — HEPATIC FUNCTION PANEL
ALT: 9 U/L (ref 0–44)
AST: 11 U/L — ABNORMAL LOW (ref 15–41)
Albumin: 3.1 g/dL — ABNORMAL LOW (ref 3.5–5.0)
Alkaline Phosphatase: 76 U/L (ref 38–126)
Bilirubin, Direct: 0.1 mg/dL (ref 0.0–0.2)
Indirect Bilirubin: 0.4 mg/dL (ref 0.3–0.9)
Total Bilirubin: 0.5 mg/dL (ref 0.3–1.2)
Total Protein: 6.6 g/dL (ref 6.5–8.1)

## 2022-10-13 LAB — CBG MONITORING, ED: Glucose-Capillary: 98 mg/dL (ref 70–99)

## 2022-10-13 LAB — MAGNESIUM: Magnesium: 1.8 mg/dL (ref 1.7–2.4)

## 2022-10-13 LAB — HEMOGLOBIN A1C
Hgb A1c MFr Bld: 6.1 % — ABNORMAL HIGH (ref 4.8–5.6)
Mean Plasma Glucose: 128 mg/dL

## 2022-10-13 LAB — PROTIME-INR
INR: 1 (ref 0.8–1.2)
Prothrombin Time: 13 seconds (ref 11.4–15.2)

## 2022-10-13 LAB — MRSA NEXT GEN BY PCR, NASAL: MRSA by PCR Next Gen: NOT DETECTED

## 2022-10-13 MED ORDER — VANCOMYCIN HCL IN DEXTROSE 1-5 GM/200ML-% IV SOLN
1000.0000 mg | Freq: Three times a day (TID) | INTRAVENOUS | Status: DC
  Administered 2022-10-13 – 2022-10-14 (×4): 1000 mg via INTRAVENOUS
  Filled 2022-10-13 (×6): qty 200

## 2022-10-13 NOTE — Consult Note (Addendum)
PODIATRY CONSULTATION  NAME CLARANCE Diaz MRN [id number] DOB Apr 21, 1966 DOA 10/12/2022   Reason for consult:  Chief Complaint  Patient presents with   Wound Infection    Attending/Consulting physician: Dr. Starla Link  History of present illness: 56 y.o. male with PMHx significant for type 2 diabetes mellitus complicated by recurrent wounds on the bilateral lower extremities, status post transmetatarsal amputation of the right foot in February 2023, s/p left BKA, chronic alcohol abuse, essential hypertension, hyperlipidemia who presented with right foot redness swelling and pain. The patient reports 1 week of progressive discomfort associate with the lateral aspect of his right foot associated with increased swelling, erythema, increased warmth, as well as some serous, nonpurulent drainage.  This is in the context of recurrent diabetic lower extremity wounds, status post transmetatarsal imitation of the right foot in February 2023.  Was seen in clinic yesterday by LPN and concern for worsening erythema, was referred to ED for IV abx and MRI. States the redness and pain in the right foot have decreased significantly since admission. Has been doing daily dressing changes to right foot.   Past Medical History:  Diagnosis Date   Alcohol abuse    Alcoholic peripheral neuropathy ([place])    Anxiety    Arthritis    Asthma    followed by pcp   B12 deficiency    CAD in native artery 06/07/2022   Chronic multifocal osteomyelitis of right foot ([place]) 05/23/2022   Depression    Diabetic neuropathy ([place]ug[place])    Edema of both lower extremities    Essential tremor    neurologist-- dr patel--- due to alcohol abuse   GERD (gastroesophageal reflux disease)    History of cellulitis 2020   left lower leg;   recurrent 03-25-2020   History of esophageal stricture    s/p  dilatation 02-13-2018   History of osteomyelitis 2020   left great toe     Hypercholesterolemia    Hypertension    followed by pcp  (nuclear  stress test 03-11-2014 low risk w/ no ischemia, ef 65%   Normocytic anemia    followed by pcp   (03-18-2020 had transfusion's 02/ 2021)   Osteomyelitis of great toe of right foot ([place]) 08/18/2021   Peripheral vascular disease ([place])    Severe depression ([place]) 06/09/2021   Status post incision and drainage followed by Dr [name] and pcp   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   (03-17-2020  pt states most of incision is healed with exception an area that is still draining,  daily dressing change),  foot is red but not warm to the touch and has swelling but has improved)   Suicidal ideation 06/09/2021   Syncope and collapse 06/07/2022   Syncope, near 06/20/2022   Thrombocytopenia ([place])    chronic   Type 2 diabetes mellitus ([place])    followed by pcp---    (03-18-2020  pt stated does not check blood sugar)   Vaccine counseling 08/29/2022       Latest Ref Rng & Units 10/13/2022    4:15 AM 10/12/2022    4:41 PM 07/20/2022    2:54 PM  CBC  WBC 4.0 - 10.5 K/uL 6.8  9.6  7.0   Hemoglobin 13.0 - 17.0 g/dL 15.8  16.5  16.4   Hematocrit 39.0 - 52.0 % 47.4  48.9  48.3   Platelets 150 - 400 K/uL 188  208  199        Latest Ref Rng & Units  10/12/2022    4:41 PM 08/31/2022   10:52 AM 07/20/2022    2:54 PM  BMP  Glucose 70 - 99 mg/dL 114  123  185   BUN 6 - 20 mg/dL '9  11  11   '$ Creatinine 0.61 - 1.24 mg/dL 0.71  0.95  0.75   BUN/Creat Ratio 9 - 20  12  SEE NOTE:   Sodium 135 - 145 mmol/L 137  139  137   Potassium 3.5 - 5.1 mmol/L 4.2  5.2  4.3   Chloride 98 - 111 mmol/L 106  104  104   CO2 22 - 32 mmol/L '21  21  26   '$ Calcium 8.9 - 10.3 mg/dL 9.2  9.7  9.3       Physical Exam: Lower Extremity Exam Vasc: R - PT palpable, DP palpable.    Derm: R - Superificial ucleration with dry granular wound bed medial and lateral aspect of right foot TMA site. Hyperkeratotic tissue surrounding the ulcer. Decreased erythema from prior pictures, nearly resolved.  No evidence spreading infection drainage.  Edema.        L - s/p BKA  MSK:  R - S/p TMA  L - s/p L bka  Neuro: R - Gross sensation absent at amputation site    ASSESSMENT/PLAN OF CARE 56 y.o. male with PMHx significant for  type 2 diabetes mellitus complicated by recurrent wounds on the bilateral lower extremities, status post transmetatarsal amputation of the right foot in February 2023, s/p left BKA, chronic alcohol abuse, essential hypertension, hyperlipidemia  with Cellulitis of the right foot TMA site. Now improving s/p IV abx.   - No surgical plans, no evidence of deep infection OM or abscess. Improving clinically on IV abx.  - Continue IV abx broad x 24 hrs then [facility] for DC on PO abx x 10 days, recommend doxycycline 100 mg BID x 10 days - Anticoagulation: per primary - Wound care: betadine wet to dry dressing to right foot TMA ulceration medial and lateral amp site covered with kerlix, ace wrap. - WB status: WBAT to RLE - Will continue to follow   Thank you for the consult.  Please contact me directly with any questions or concerns.           Everitt Amber, DPM [facility]    [address], [place] [address]                Office [phone]  Fax [phone]

## 2022-10-13 NOTE — Progress Notes (Signed)
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  On presentation, x-ray of the right foot showed soft tissue swelling with concerns for potential cellulitis with no evidence of osteomyelitis, fracture or evidence of subcutaneous gas.  ED provider spoke to podiatry on-call who recommended IV antibiotics.  Assessment & Plan:   Infected diabetic right foot wound -MRI of right foot without contrast showed no evidence of osteomyelitis; showed nonspecific muscular edema consistent with myositis.  Currently on broad-spectrum antibiotics.  Podiatry evaluation pending.  Will put him on a diet. -Right [name] was normal.  Diabetes mellitus type 2 -Continue CBGs with SSI  Essential hypertension -Blood pressure stable.  Continue monitoring.  Antihypertensives on hold.  Hyperlipidemia -Resume statin  Alcohol abuse -Continue CIWA protocol along with folic acid, multivitamin and thiamine.  TOC consult.  Obesity -Outpatient follow-up   DVT prophylaxis: SCDs on right Code Status: Full Family Communication: None at bedside Disposition Plan: Status is: Inpatient Remains inpatient appropriate because: Of severity of illness  Consultants: Podiatry  Procedures: None  Antimicrobials: Cefepime, Flagyl and vancomycin from 10/12/2022 onwards   Subjective: Patient seen and examined at bedside.  No fever, vomiting, chest pain reported.  Complains of right foot pain.  Objective: Vitals:   10/13/22 0500 10/13/22 0600 10/13/22 0838 10/13/22 0915  BP: 126/75 137/77  125/84   Pulse: 64 69  71  Resp: '14 18  20  '$ Temp:   98.2 F (36.8 C)   TempSrc:   Oral   SpO2: 97% 97%  96%  Weight:      Height:        Intake/Output Summary (Last 24 hours) at 10/13/2022 1041 Last data filed at 10/13/2022 0657 Gross per 24 hour  Intake 800.31 ml  Output --  Net 800.31 ml   Filed Weights   10/12/22 1558  Weight: 117.9 kg    Examination:  General exam: Appears calm and comfortable.  Looks chronically ill and deconditioned.  On room air. Respiratory system: Bilateral decreased breath sounds at bases with some scattered crackles Cardiovascular system: S1 & S2 heard, Rate controlled Gastrointestinal system: Abdomen is nondistended, soft and nontender. Normal bowel sounds heard. Extremities: Left BKA present.  Right transmetatarsal amputation with increased warmth, tenderness, swelling in the lateral aspect of right foot. Central nervous system: Alert and oriented.  Slow to respond.  Poor historian.  No focal neurological deficits. Moving extremities Skin: No ecchymosis/lesions Psychiatry: Flat affect; no signs of agitation..     Data Reviewed: I have personally reviewed following labs and imaging studies  CBC: Recent Labs  Lab 10/12/22 1641 10/13/22 0415  WBC 9.6 6.8  NEUTROABS 5.3  --   HGB 16.5 15.8  HCT 48.9 47.4  MCV 92.4 94.4  PLT 208 026   Basic Metabolic Panel: Recent Labs  Lab 10/12/22 1641 10/12/22 2225  NA 137  --   K 4.2  --   CL 106  --   CO2 21*  --   GLUCOSE 114*  --   BUN 9  --   CREATININE 0.71  --   CALCIUM 9.2  --   MG  --  1.9  GFR: Estimated Creatinine Clearance: 139.1 mL/min (by C-G formula based on SCr of 0.71 mg/dL). Liver Function Tests: No results for input(s): "AST", "ALT", "ALKPHOS", "BILITOT", "PROT", "ALBUMIN" in the last 168 hours. No results for input(s): "LIPASE", "AMYLASE" in the last 168 hours. No results for input(s): "AMMONIA" in the last 168 hours. Coagulation Profile: Recent Labs  Lab 10/13/22 0415   INR 1.0   Cardiac Enzymes: No results for input(s): "CKTOTAL", "CKMB", "CKMBINDEX", "TROPONINI" in the last 168 hours. BNP (last 3 results) No results for input(s): "PROBNP" in the last 8760 hours. HbA1C: No results for input(s): "HGBA1C" in the last 72 hours. CBG: Recent Labs  Lab 10/13/22 0534  GLUCAP 98   Lipid Profile: No results for input(s): "CHOL", "HDL", "LDLCALC", "TRIG", "CHOLHDL", "LDLDIRECT" in the last 72 hours. Thyroid Function Tests: No results for input(s): "TSH", "T4TOTAL", "FREET4", "T3FREE", "THYROIDAB" in the last 72 hours. Anemia Panel: No results for input(s): "VITAMINB12", "FOLATE", "FERRITIN", "TIBC", "IRON", "RETICCTPCT" in the last 72 hours. Sepsis Labs: Recent Labs  Lab 10/12/22 1641 10/12/22 2225  LATICACIDVEN 0.7 1.5    No results found for this or any previous visit (from the past 240 hour(s)).       Radiology Studies: VAS [place] [name] WITH/WO TBI  Result Date: 10/13/2022  LOWER EXTREMITY DOPPLER STUDY Patient Name:  Eugene Diaz  Date of Exam:   10/13/2022 Medical Rec #: [id number]        Accession #:    [id number] Date of Birth: Jun 18, 1966        Patient Gender: M Patient Age:   36 years Exam Location:  [facility] Procedure:      VAS [place] [name] WITH/WO TBI Referring Phys: [facility] --------------------------------------------------------------------------------  Indications: Rest pain, ulceration, and RLE gangrene to toes - post op TMA. left              BKA and right transmetatarsal amputation High Risk         Hypertension, hyperlipidemia, Diabetes, coronary artery Factors:          disease.  Comparison Study: 12/06/21 prior Performing Technologist: [facility] RVS  Examination Guidelines: A complete evaluation includes at minimum, Doppler waveform signals and systolic blood pressure reading at the level of bilateral brachial, anterior tibial, and posterior tibial arteries, when vessel segments are accessible. Bilateral testing is considered an  integral part of a complete examination. Photoelectric Plethysmograph (PPG) waveforms and toe systolic pressure readings are included as required and additional duplex testing as needed. Limited examinations for reoccurring indications may be performed as noted.  [name] Findings: +---------+------------------+-----+---------+--------+ Right    Rt Pressure (mmHg)IndexWaveform Comment  +---------+------------------+-----+---------+--------+ Brachial 146                    triphasic         +---------+------------------+-----+---------+--------+ PTA      184               1.26 triphasic         +---------+------------------+-----+---------+--------+ DP       159               1.09 triphasic         +---------+------------------+-----+---------+--------+ Great Toe                                TMA      +---------+------------------+-----+---------+--------+ +--------+------------------+-----+--------+-------+ Left    Lt Pressure (mmHg)IndexWaveformComment +--------+------------------+-----+--------+-------+ [id number]                                    +--------+------------------+-----+--------+-------+  PTA                                    BKA     +--------+------------------+-----+--------+-------+ DP                                     BKA     +--------+------------------+-----+--------+-------+ +-------+-----------+-----------+------------+------------+ [name]/TBIToday's ABIToday's TBIPrevious ABIPrevious TBI +-------+-----------+-----------+------------+------------+ Right  1.26       TMA                                 +-------+-----------+-----------+------------+------------+ Left   BKA                                            +-------+-----------+-----------+------------+------------+  Summary: Right: Resting right ankle-brachial index is within normal range. Left: BKA. *See table(s) above for measurements and observations.     Preliminary     MR FOOT RIGHT WO CONTRAST  Result Date: 10/13/2022 CLINICAL DATA:  Worsening erythema at right foot wound status post transmetatarsal amputation 12/04/2021. Osteomyelitis suspected. Diabetes. EXAM: MRI OF THE RIGHT FOREFOOT WITHOUT CONTRAST TECHNIQUE: Multiplanar, multisequence MR imaging of the right forefoot was performed. No intravenous contrast was administered. COMPARISON:  Radiographs 10/12/2022 and 05/20/2022.  MRI 05/20/2022. FINDINGS: Despite efforts by the technologist and patient, mild to moderate motion artifact is present on today's exam and could not be eliminated. This reduces exam sensitivity and specificity. Bones/Joint/Cartilage Unchanged appearance of the remaining metatarsal status post transmetatarsal amputation. No progressive cortical destruction or T1 signal abnormality identified. Previously demonstrated mild T2 hyperintensity within the distal shafts of the 3rd through 5th metatarsals has improved. Contrast was not administered for today's examination. The visualized tarsal bones appear normal. The alignment is normal at the Lisfranc joint. No significant joint effusion. Ligaments The Lisfranc ligament is intact. Muscles and Tendons Nonspecific muscular edema consistent with myositis. No significant tenosynovitis. Soft tissues Chronic soft tissue ulceration along the plantar aspect of the remaining 1st metatarsal is similar to prior examination. No underlying focal fluid collection is identified. Generalized subcutaneous edema in the dorsum of the foot has improved from the prior study. IMPRESSION: 1. Similar appearance of the right forefoot status post transmetatarsal amputation. Previously demonstrated mild marrow T2 hyperintensity within the metatarsal shafts has improved, and there is no T1 marrow abnormality or progressive cortical destruction to suggest osteomyelitis. 2. Interval improvement in nonspecific subcutaneous edema in the dorsum of the foot. No focal fluid collection  identified to suggest abscess. 3. Nonspecific muscular edema consistent with myositis. Electronically Signed   By: Richardean Sale M.D.   On: 10/13/2022 08:30   DG Foot Complete Right  Result Date: 10/12/2022 CLINICAL DATA:  Infection. Right foot wound. EXAM: RIGHT FOOT COMPLETE - 3+ VIEW COMPARISON:  Most recent radiograph 05/20/2022 FINDINGS: Previous transmetatarsal amputation of all 5 rays. Heterotopic calcification of the PTA shin site. No erosive change or bony destruction. Skin and soft tissue irregularity about the stump both medial and laterally. Linear density in the medial foot about the first ray may be related to overlying dressing or soft tissue calcification. No radiopaque foreign body. No soft tissue gas. IMPRESSION: 1. Soft tissue irregularity about  the stump of the foot consistent with wound. No radiographic findings of osteomyelitis. 2. Previous transmetatarsal amputation of all 5 rays. Electronically Signed   By: Keith Rake M.D.   On: 10/12/2022 17:19        Scheduled Meds:  folic acid  1 mg Oral Daily   insulin aspart  0-9 Units Subcutaneous Q6H   multivitamin with minerals  1 tablet Oral Daily   thiamine (VITAMIN B1) injection  100 mg Intravenous Q24H   Continuous Infusions:  ceFEPime (MAXIPIME) IV Stopped (10/13/22 0657)   metronidazole Stopped (10/13/22 1035)   vancomycin 1,000 mg (10/13/22 1035)          Aline August, [facility] 10/13/2022, 10:41 AM

## 2022-10-13 NOTE — Progress Notes (Signed)
[name] has been completed.   Preliminary results in CV Proc.   [facility] 10/13/2022 9:39 AM

## 2022-10-13 NOTE — ED Notes (Signed)
ED TO INPATIENT HANDOFF REPORT  ED Nurse Name and Phone #: Luetta Nutting [phone]  S Name/Age/Gender Robynn Pane 56 y.o. male Room/Bed: 040C/040C  Code Status   Code Status: Full Code  Home/SNF/Other Home Patient oriented to: self, place, time, and situation Is this baseline? Yes   Triage Complete: Triage complete  Chief Complaint Cellulitis of right foot [O17.510]  Triage Note Pt sent [facility] for IV abx for right foot wound. Pt had toes amputated early this year and has been having dressing changes since. Wound is red and warm to touch per doc notes.   Allergies No Known Allergies  Level of Care/Admitting Diagnosis ED Disposition     ED Disposition  Admit   Condition  --   Comment  Hospital Area: [place] [[id number]]  Level of Care: Med-Surg [16]  May admit patient to [facility] or [facility] if equivalent level of care is available:: No  Covid Evaluation: Asymptomatic - no recent exposure (last 10 days) testing not required  Diagnosis: Cellulitis of right foot [258527]  Admitting Physician: Rhetta Mura [[id number]]  Attending Physician: Rhetta Mura [[id number]]  Certification:: I certify this patient will need inpatient services for at least 2 midnights  Estimated Length of Stay: 2          B Medical/Surgery History Past Medical History:  Diagnosis Date   Alcohol abuse    Alcoholic peripheral neuropathy ([place])    Anxiety    Arthritis    Asthma    followed by pcp   B12 deficiency    CAD in native artery 06/07/2022   Chronic multifocal osteomyelitis of right foot ([place]) 05/23/2022   Depression    Diabetic neuropathy ([place])    Edema of both lower extremities    Essential tremor    neurologist-- dr patel--- due to alcohol abuse   GERD (gastroesophageal reflux disease)    History of cellulitis 2020   left lower leg;   recurrent 03-25-2020   History of esophageal stricture    s/p  dilatation 02-13-2018   History of  osteomyelitis 2020   left great toe     Hypercholesterolemia    Hypertension    followed by pcp  (nuclear stress test 03-11-2014 low risk w/ no ischemia, ef 65%   Normocytic anemia    followed by pcp   (03-18-2020 had transfusion's 02/ 2021)   Osteomyelitis of great toe of right foot ([place]) 08/18/2021   Peripheral vascular disease ([place])    Severe depression ([place]) 06/09/2021   Status post incision and drainage followed by Dr [name] and pcp   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   (03-17-2020  pt states most of incision is healed with exception an area that is still draining,  daily dressing change),  foot is red but not warm to the touch and has swelling but has improved)   Suicidal ideation 06/09/2021   Syncope and collapse 06/07/2022   Syncope, near 06/20/2022   Thrombocytopenia ([place])    chronic   Type 2 diabetes mellitus ([place])    followed by pcp---    (03-18-2020  pt stated does not check blood sugar)   Vaccine counseling 08/29/2022   Past Surgical History:  Procedure Laterality Date   AMPUTATION Left 10/16/2019   Procedure: Left partial second ray resection; placement of antibiotic beads;  Surgeon: Evelina Bucy, DPM;  [facility];  Service: Podiatry;  Laterality: Left;   AMPUTATION Left 11/13/2019   Procedure:  Left Midfoot Amputation - Transmetatarsal vs. Lisfranc; Placement antibiotic beads;  Surgeon: Evelina Bucy, DPM;  [facility];  Service: Podiatry;  Laterality: Left;   AMPUTATION Left 12/21/2019   Procedure: Chopart Amputation left foot;  Surgeon: Evelina Bucy, DPM;  Location: [place];  Service: Podiatry;  Laterality: Left;   AMPUTATION Left 06/24/2020   Procedure: LEFT BELOW KNEE AMPUTATION;  Surgeon: Newt Minion, MD;  Location: [facility];  Service: Orthopedics;  Laterality: Left;   AMPUTATION Right 03/29/2021   Procedure: AMPUTATION RAY;  Surgeon: Evelina Bucy, DPM;  Location: [place];  Service: Podiatry;  Laterality: Right;   AMPUTATION TOE Left  08/14/2019   Procedure: AMPUTATION GREAT TOE;  Surgeon: Evelina Bucy, DPM;  [facility];  Service: Podiatry;  Laterality: Left;   APPLICATION OF WOUND VAC Left 12/21/2019   Procedure: Application Of Wound Vac;  Surgeon: Evelina Bucy, DPM;  Location: [place];  Service: Podiatry;  Laterality: Left;   BIOPSY  02/13/2018   Procedure: BIOPSY;  Surgeon: Danie Binder, MD;  [facility];  Service: Endoscopy;;  transverse colon biopsy, gastric biopsy   COLONOSCOPY WITH PROPOFOL N/A 02/13/2018   Procedure: COLONOSCOPY WITH PROPOFOL;  Surgeon: Danie Binder, MD;  [facility];  Service: Endoscopy;  Laterality: N/A;  11:15am   ESOPHAGOGASTRODUODENOSCOPY (EGD) WITH PROPOFOL N/A 02/13/2018   Procedure: ESOPHAGOGASTRODUODENOSCOPY (EGD) WITH PROPOFOL;  Surgeon: Danie Binder, MD;  [facility];  Service: Endoscopy;  Laterality: N/A;   I & D EXTREMITY Left 07/16/2019   Procedure: Insicion  AND DEBRIDEMENT EXTREMITY;  Surgeon: Evelina Bucy, DPM;  Location: [facility];  Service: Podiatry;  Laterality: Left;   I & D EXTREMITY Left 06/22/2020   Procedure: IRRIGATION AND DEBRIDEMENT EXTREMITY, left foot and ankle;  Surgeon: Evelina Bucy, DPM;  Location: [facility];  Service: Podiatry;  Laterality: Left;   I & D EXTREMITY Right 03/29/2021   Procedure: IRRIGATION AND DEBRIDEMENT EXTREMITY;  Surgeon: Evelina Bucy, DPM;  Location: [place];  Service: Podiatry;  Laterality: Right;   INCISION AND DRAINAGE OF WOUND Left 10/13/2019   Procedure: IRRIGATION AND DEBRIDEMENT WOUND OF LEFT FOOT AND FIRST METATARSAL RESECTION;  Surgeon: Evelina Bucy, DPM;  [facility];  Service: Podiatry;  Laterality: Left;   IRRIGATION AND DEBRIDEMENT FOOT Left 11/11/2019   Procedure: Left Foot Wound Irrigation and Debridement;  Surgeon: Evelina Bucy, DPM;  [facility];  Service: Podiatry;  Laterality: Left;   Left arm     Left arm repair (tendon and artery)   PERCUTANEOUS PINNING   07/16/2019   Procedure: Open Reduction Percutaneous Pinning Extremity;  Surgeon: Evelina Bucy, DPM;  Location: [place];  Service: Podiatry;;   PILONIDAL CYST EXCISION N/A 08/08/2014   Procedure: CYST EXCISION PILONIDAL EXTENSIVE;  Surgeon: Jamesetta So, MD;  [facility];  Service: General;  Laterality: N/A;   POLYPECTOMY  02/13/2018   Procedure: POLYPECTOMY;  Surgeon: Danie Binder, MD;  [facility];  Service: Endoscopy;;  transverse colon polyp hs, rectal polyps times 2   SAVORY DILATION N/A 02/13/2018   Procedure: SAVORY DILATION;  Surgeon: Danie Binder, MD;  [facility];  Service: Endoscopy;  Laterality: N/A;   TENDON LENGTHENING Right 12/04/2021   Procedure: TENDON LENGTHENING;  Surgeon: Landis Martins, DPM;  Location: [place];  Service: Podiatry;  Laterality: Right;   TRANSMETATARSAL AMPUTATION Right 12/04/2021   Procedure: TRANSMETATARSAL AMPUTATION;  Surgeon: Landis Martins, DPM;  Location: [place];  Service: Podiatry;  Laterality: Right;   WOUND DEBRIDEMENT Left 09/04/2019   Procedure: DEBRIDEMENT WOUND WITH COMPLEX  REPAIR OF DEHISCENCE;  Surgeon: Evelina Bucy, DPM;  Location: [place];  Service: Podiatry;  Laterality: Left;  Leave patient on stretcher   WOUND DEBRIDEMENT Left 10/16/2019   Procedure: Left foot wound debridement and closure;  Surgeon: Evelina Bucy, DPM;  [facility];  Service: Podiatry;  Laterality: Left;   WOUND DEBRIDEMENT Left 12/18/2019   Procedure: LEFT FOOT DEBRIDEMENT WITH PARTIAL INCISION OF INFECTED BONE;  Surgeon: Evelina Bucy, DPM;  Location: [place];  Service: Podiatry;  Laterality: Left;  LEFT FOOT DEBRIDEMENT WITH PARTIAL INCISION OF INFECTED BONE   WOUND DEBRIDEMENT Right 04/01/2021   Procedure: Right foot wound debridement and irrigation and closure, bone biopsy;  Surgeon: Evelina Bucy, DPM;  Location: [place];  Service: Podiatry;  Laterality: Right;     A IV Location/Drains/Wounds Patient  Lines/Drains/Airways Status     Active Line/Drains/Airways     Name Placement date Placement time Site Days   Peripheral IV 10/12/22 Left;Posterior Hand 10/12/22  2230  Hand  1   Incision (Closed) 06/24/20 Leg Left 06/24/20  1827  -- 841   Incision (Closed) 03/29/21 Foot Right 03/29/21  1618  -- 563   Incision (Closed) 04/01/21 Foot Right 04/01/21  1700  -- 560   Incision (Closed) 12/04/21 Foot Right 12/04/21  1324  -- 313   Wound / Incision (Open or Dehisced) 03/27/21 Diabetic ulcer Foot Right 1.5x3x0.5 ulcer noted; yellow and black in color with serous-sang. drainage, with strong odor. 03/27/21  0000  Foot  565   Wound / Incision (Open or Dehisced) 05/20/22 Non-pressure wound Foot Anterior;Right Open area to right foot amputation measuring 2.5cm X 2cm X.8cm 05/20/22  2020  Foot  146            Intake/Output Last 24 hours  Intake/Output Summary (Last 24 hours) at 10/13/2022 1130 Last data filed at 10/13/2022 0657 Gross per 24 hour  Intake 800.31 ml  Output --  Net 800.31 ml    Labs/Imaging Results for orders placed or performed during the hospital encounter of 10/12/22 (from the past 48 hour(s))  CBC with Differential     Status: Abnormal   Collection Time: 10/12/22  4:41 PM  Result Value Ref Range   WBC 9.6 4.0 - 10.5 K/uL   RBC 5.29 4.22 - 5.81 MIL/uL   Hemoglobin 16.5 13.0 - 17.0 g/dL   HCT 48.9 39.0 - 52.0 %   MCV 92.4 80.0 - 100.0 fL   MCH 31.2 26.0 - 34.0 pg   MCHC 33.7 30.0 - 36.0 g/dL   RDW 15.0 11.5 - 15.5 %   Platelets 208 150 - 400 K/uL   nRBC 0.0 0.0 - 0.2 %   Neutrophils Relative % 55 %   Neutro Abs 5.3 1.7 - 7.7 K/uL   Lymphocytes Relative 33 %   Lymphs Abs 3.1 0.7 - 4.0 K/uL   Monocytes Relative 4 %   Monocytes Absolute 0.4 0.1 - 1.0 K/uL   Eosinophils Relative 6 %   Eosinophils Absolute 0.6 (H) 0.0 - 0.5 K/uL   Basophils Relative 1 %   Basophils Absolute 0.1 0.0 - 0.1 K/uL   Immature Granulocytes 1 %   Abs Immature Granulocytes 0.05 0.00 - 0.07  K/uL    Comment: Performed at [facility], [address],  [place], [place] [address]  Basic metabolic panel     Status: Abnormal   Collection Time: 10/12/22  4:41 PM  Result Value Ref Range   Sodium 137 135 - 145 mmol/L   Potassium 4.2 3.5 - 5.1 mmol/L   Chloride 106 98 - 111 mmol/L   CO2 21 (L) 22 - 32 mmol/L   Glucose, Bld 114 (H) 70 - 99 mg/dL    Comment: Glucose reference range applies only to samples taken after fasting for at least 8 hours.   BUN 9 6 - 20 mg/dL   Creatinine, Ser 0.71 0.61 - 1.24 mg/dL   Calcium 9.2 8.9 - 10.3 mg/dL   GFR, Estimated >60 >60 mL/min    Comment: (NOTE) Calculated using the CKD-EPI Creatinine Equation (2021)    Anion gap 10 5 - 15    Comment: Performed at [facility] [address] [place], [place] [address]  Lactic acid, plasma     Status: None   Collection Time: 10/12/22  4:41 PM  Result Value Ref Range   Lactic Acid, Venous 0.7 0.5 - 1.9 mmol/L    Comment: Performed at [facility] [address], [place], [place] [address]  Lactic acid, plasma     Status: None   Collection Time: 10/12/22 10:25 PM  Result Value Ref Range   Lactic Acid, Venous 1.5 0.5 - 1.9 mmol/L    Comment: Performed at [facility] [address] [place], [place] [address]  C-reactive protein     Status: Abnormal   Collection Time: 10/12/22 10:25 PM  Result Value Ref Range   CRP 1.1 (H) <1.0 mg/dL    Comment: Performed at [facility], [place] [address], [place], [place] [address]  Sedimentation rate     Status: None   Collection Time: 10/12/22 10:25 PM  Result Value Ref Range   Sed Rate 11 0 - 16 mm/hr    Comment: Performed at [facility] [address], [place], [place] [address]  Magnesium     Status: None   Collection Time: 10/12/22 10:25 PM  Result Value Ref Range   Magnesium 1.9 1.7 - 2.4 mg/dL    Comment: Performed at [facility], [place] [address], [place] [address]  CBC     Status: None   Collection  Time: 10/13/22  4:15 AM  Result Value Ref Range   WBC 6.8 4.0 - 10.5 K/uL   RBC 5.02 4.22 - 5.81 MIL/uL   Hemoglobin 15.8 13.0 - 17.0 g/dL   HCT 47.4 39.0 - 52.0 %   MCV 94.4 80.0 - 100.0 fL   MCH 31.5 26.0 - 34.0 pg   MCHC 33.3 30.0 - 36.0 g/dL   RDW 14.9 11.5 - 15.5 %   Platelets 188 150 - 400 K/uL   nRBC 0.0 0.0 - 0.2 %    Comment: Performed at [facility], [place] [address], [place], [place] [address]  Protime-INR     Status: None   Collection Time: 10/13/22  4:15 AM  Result Value Ref Range   Prothrombin Time 13.0 11.4 - 15.2 seconds   INR 1.0 0.8 - 1.2    Comment: (NOTE) INR goal varies based on device and disease states. Performed at [facility], [place] [address], [place], [place] [address]   CBG monitoring, ED     Status: None   Collection Time: 10/13/22  5:34 AM  Result Value Ref  Range   Glucose-Capillary 98 70 - 99 mg/dL    Comment: Glucose reference range applies only to samples taken after fasting for at least 8 hours.   VAS [place] [name] WITH/WO TBI  Result Date: 10/13/2022  LOWER EXTREMITY DOPPLER STUDY Patient Name:  ANTIONIO NEGRON  Date of Exam:   10/13/2022 Medical Rec #: [id number]        Accession #:    [id number] Date of Birth: 1966-03-13        Patient Gender: M Patient Age:   9 years Exam Location:  [facility] Procedure:      VAS [place] [name] WITH/WO TBI Referring Phys: KEN LE --------------------------------------------------------------------------------  Indications: Rest pain, ulceration, and RLE gangrene to toes - post op TMA. left              BKA and right transmetatarsal amputation High Risk         Hypertension, hyperlipidemia, Diabetes, coronary artery Factors:          disease.  Comparison Study: 12/06/21 prior Performing Technologist: Archie Patten RVS  Examination Guidelines: A complete evaluation includes at minimum, Doppler waveform signals and systolic blood pressure reading at the level of bilateral brachial, anterior tibial, and posterior  tibial arteries, when vessel segments are accessible. Bilateral testing is considered an integral part of a complete examination. Photoelectric Plethysmograph (PPG) waveforms and toe systolic pressure readings are included as required and additional duplex testing as needed. Limited examinations for reoccurring indications may be performed as noted.  [name] Findings: +---------+------------------+-----+---------+--------+ Right    Rt Pressure (mmHg)IndexWaveform Comment  +---------+------------------+-----+---------+--------+ Brachial 146                    triphasic         +---------+------------------+-----+---------+--------+ PTA      184               1.26 triphasic         +---------+------------------+-----+---------+--------+ DP       159               1.09 triphasic         +---------+------------------+-----+---------+--------+ Great Toe                                TMA      +---------+------------------+-----+---------+--------+ +--------+------------------+-----+--------+-------+ Left    Lt Pressure (mmHg)IndexWaveformComment +--------+------------------+-----+--------+-------+ [id number]                                    +--------+------------------+-----+--------+-------+ PTA                                    BKA     +--------+------------------+-----+--------+-------+ DP                                     BKA     +--------+------------------+-----+--------+-------+ +-------+-----------+-----------+------------+------------+ [name]/TBIToday's ABIToday's TBIPrevious ABIPrevious TBI +-------+-----------+-----------+------------+------------+ Right  1.26       TMA                                 +-------+-----------+-----------+------------+------------+ Left   BKA                                            +-------+-----------+-----------+------------+------------+  Summary: Right: Resting right ankle-brachial index is within normal  range. Left: BKA. *See table(s) above for measurements and observations.     Preliminary    MR FOOT RIGHT WO CONTRAST  Result Date: 10/13/2022 CLINICAL DATA:  Worsening erythema at right foot wound status post transmetatarsal amputation 12/04/2021. Osteomyelitis suspected. Diabetes. EXAM: MRI OF THE RIGHT FOREFOOT WITHOUT CONTRAST TECHNIQUE: Multiplanar, multisequence MR imaging of the right forefoot was performed. No intravenous contrast was administered. COMPARISON:  Radiographs 10/12/2022 and 05/20/2022.  MRI 05/20/2022. FINDINGS: Despite efforts by the technologist and patient, mild to moderate motion artifact is present on today's exam and could not be eliminated. This reduces exam sensitivity and specificity. Bones/Joint/Cartilage Unchanged appearance of the remaining metatarsal status post transmetatarsal amputation. No progressive cortical destruction or T1 signal abnormality identified. Previously demonstrated mild T2 hyperintensity within the distal shafts of the 3rd through 5th metatarsals has improved. Contrast was not administered for today's examination. The visualized tarsal bones appear normal. The alignment is normal at the Lisfranc joint. No significant joint effusion. Ligaments The Lisfranc ligament is intact. Muscles and Tendons Nonspecific muscular edema consistent with myositis. No significant tenosynovitis. Soft tissues Chronic soft tissue ulceration along the plantar aspect of the remaining 1st metatarsal is similar to prior examination. No underlying focal fluid collection is identified. Generalized subcutaneous edema in the dorsum of the foot has improved from the prior study. IMPRESSION: 1. Similar appearance of the right forefoot status post transmetatarsal amputation. Previously demonstrated mild marrow T2 hyperintensity within the metatarsal shafts has improved, and there is no T1 marrow abnormality or progressive cortical destruction to suggest osteomyelitis. 2. Interval  improvement in nonspecific subcutaneous edema in the dorsum of the foot. No focal fluid collection identified to suggest abscess. 3. Nonspecific muscular edema consistent with myositis. Electronically Signed   By: Richardean Sale M.D.   On: 10/13/2022 08:30   DG Foot Complete Right  Result Date: 10/12/2022 CLINICAL DATA:  Infection. Right foot wound. EXAM: RIGHT FOOT COMPLETE - 3+ VIEW COMPARISON:  Most recent radiograph 05/20/2022 FINDINGS: Previous transmetatarsal amputation of all 5 rays. Heterotopic calcification of the PTA shin site. No erosive change or bony destruction. Skin and soft tissue irregularity about the stump both medial and laterally. Linear density in the medial foot about the first ray may be related to overlying dressing or soft tissue calcification. No radiopaque foreign body. No soft tissue gas. IMPRESSION: 1. Soft tissue irregularity about the stump of the foot consistent with wound. No radiographic findings of osteomyelitis. 2. Previous transmetatarsal amputation of all 5 rays. Electronically Signed   By: Keith Rake M.D.   On: 10/12/2022 17:19    Pending Labs Unresulted Labs (From admission, onward)     Start     Ordered   10/13/22 6767  Basic metabolic panel  Once,   STAT        10/13/22 0903   10/13/22 0903  Hepatic function panel  Once,   STAT        10/13/22 0903   10/13/22 0903  Magnesium  Once,   STAT        10/13/22 0903   10/13/22 2094  Basic metabolic panel  Daily,   R      10/12/22 2305   10/13/22 0500  CBC  Daily,   R      10/12/22 2305   10/12/22 2325  Hemoglobin A1c  Add-on,   AD       Comments: To assess prior glycemic control  10/12/22 2325   10/12/22 1628  Blood culture (routine x 2)  BLOOD CULTURE X 2,   R      10/12/22 1628            Vitals/Pain Today's Vitals   10/13/22 0600 10/13/22 0730 10/13/22 0838 10/13/22 0915  BP: 137/77   125/84  Pulse: 69   71  Resp: 18   20  Temp:   98.2 F (36.8 C)   TempSrc:   Oral   SpO2: 97%    96%  Weight:      Height:      PainSc:  7       Isolation Precautions No active isolations  Medications Medications  ceFEPIme (MAXIPIME) 2 g in sodium chloride 0.9 % 100 mL IVPB (0 g Intravenous Stopped 10/13/22 0657)  metroNIDAZOLE (FLAGYL) IVPB 500 mg (0 mg Intravenous Stopped 10/13/22 1035)  acetaminophen (TYLENOL) tablet 650 mg (has no administration in time range)    Or  acetaminophen (TYLENOL) suppository 650 mg (has no administration in time range)  naloxone (NARCAN) injection 0.4 mg (has no administration in time range)  fentaNYL (SUBLIMAZE) injection 50 mcg (50 mcg Intravenous Given 10/13/22 0418)  ondansetron (ZOFRAN) injection 4 mg (has no administration in time range)  insulin aspart (novoLOG) injection 0-9 Units ( Subcutaneous Not Given 10/13/22 0536)  LORazepam (ATIVAN) tablet 1-4 mg (has no administration in time range)    Or  LORazepam (ATIVAN) injection 1-4 mg (has no administration in time range)  folic acid (FOLVITE) tablet 1 mg (1 mg Oral Given 10/13/22 1035)  multivitamin with minerals tablet 1 tablet (1 tablet Oral Given 10/13/22 1035)  thiamine (VITAMIN B1) injection 100 mg (100 mg Intravenous Given 10/13/22 0004)  vancomycin (VANCOCIN) IVPB 1000 mg/200 mL premix (1,000 mg Intravenous New Bag/Given 10/13/22 1035)  metroNIDAZOLE (FLAGYL) IVPB 500 mg (0 mg Intravenous Stopped 10/13/22 0003)  oxyCODONE-acetaminophen (PERCOCET/ROXICET) 5-325 MG per tablet 1 tablet (1 tablet Oral Given 10/12/22 2253)  vancomycin (VANCOCIN) 2,500 mg in sodium chloride 0.9 % 500 mL IVPB (0 mg Intravenous Stopped 10/13/22 0248)    Mobility walks with device Low fall risk   Focused Assessments    R Recommendations: See Admitting Provider Note  Report given to:   Additional Notes:

## 2022-10-14 DIAGNOSIS — E1140 Type 2 diabetes mellitus with diabetic neuropathy, unspecified: Secondary | ICD-10-CM | POA: Diagnosis not present

## 2022-10-14 LAB — CBC
HCT: 47.8 % (ref 39.0–52.0)
Hemoglobin: 16.1 g/dL (ref 13.0–17.0)
MCH: 31.4 pg (ref 26.0–34.0)
MCHC: 33.7 g/dL (ref 30.0–36.0)
MCV: 93.4 fL (ref 80.0–100.0)
Platelets: 163 10*3/uL (ref 150–400)
RBC: 5.12 MIL/uL (ref 4.22–5.81)
RDW: 14.6 % (ref 11.5–15.5)
WBC: 7 10*3/uL (ref 4.0–10.5)
nRBC: 0 % (ref 0.0–0.2)

## 2022-10-14 LAB — BASIC METABOLIC PANEL
Anion gap: 9 (ref 5–15)
BUN: 11 mg/dL (ref 6–20)
CO2: 25 mmol/L (ref 22–32)
Calcium: 9 mg/dL (ref 8.9–10.3)
Chloride: 105 mmol/L (ref 98–111)
Creatinine, Ser: 0.86 mg/dL (ref 0.61–1.24)
GFR, Estimated: >60 mL/min (ref >60)
Glucose, Bld: 126 mg/dL — ABNORMAL HIGH (ref 70–99)
Potassium: 4 mmol/L (ref 3.5–5.1)
Sodium: 139 mmol/L (ref 135–145)

## 2022-10-14 LAB — GLUCOSE, CAPILLARY
Glucose-Capillary: 112 mg/dL — ABNORMAL HIGH (ref 70–99)
Glucose-Capillary: 182 mg/dL — ABNORMAL HIGH (ref 70–99)

## 2022-10-14 MED ORDER — AMLODIPINE BESYLATE 10 MG PO TABS: 10.0000 mg | ORAL_TABLET | Freq: Every day | ORAL | 0 refills | Status: DC

## 2022-10-14 MED ORDER — ADULT MULTIVITAMIN W/MINERALS CH: 1.0000 | ORAL_TABLET | Freq: Every day | ORAL | Status: DC

## 2022-10-14 MED ORDER — DOXYCYCLINE HYCLATE 100 MG PO TABS: 100.0000 mg | ORAL_TABLET | Freq: Two times a day (BID) | ORAL | 0 refills | Status: DC

## 2022-10-14 NOTE — Care Management Important Message (Signed)
Important Message  Patient Details  Name: Eugene Diaz MRN: [id number] Date of Birth: 12/06/1965   Medicare Important Message Given:  Yes     [name] 10/14/2022, 12:37 PM

## 2022-10-14 NOTE — Plan of Care (Signed)
  Problem: Coping: Goal: Ability to adjust to condition or change in health will improve Outcome: Progressing   Problem: Health Behavior/Discharge Planning: Goal: Ability to manage health-related needs will improve Outcome: Progressing   Problem: Nutritional: Goal: Progress toward achieving an optimal weight will improve Outcome: Progressing

## 2022-10-14 NOTE — Discharge Summary (Signed)
Physician Discharge Summary  Eugene Diaz QZR:[id number] DOB: 1966-06-28 DOA: 10/12/2022  PCP: Redmond School, MD  Admit date: 10/12/2022 Discharge date: 10/14/2022  Admitted From: Home Disposition: Home  Recommendations for Outpatient Follow-up:  Follow up with PCP in 1 week with repeat CBC/BMP Outpatient follow-up with podiatry Follow up in ED if symptoms worsen or new appear   Home Health: No Equipment/Devices: None  Discharge Condition: Stable CODE STATUS: Full Diet recommendation: Heart healthy/carb modified  Brief/Interim Summary: 56 year old male with history of diabetes mellitus type 2 complicated by regarding wounds on bilateral lower extremities, 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  On presentation, x-ray of the right foot showed soft tissue swelling with concerns for potential cellulitis with no evidence of osteomyelitis, fracture or evidence of subcutaneous gas.  ED provider spoke to podiatry on-call who recommended IV antibiotics.  During hospitalization, his condition has improved.  MRI was negative for osteomyelitis.  Podiatry recommended no surgical intervention and cleared the patient for discharge on oral doxycycline for 10 days with outpatient follow-up with podiatry.  He will be discharged home today.    Discharge Diagnoses:   Infected diabetic right foot wound -MRI of right foot without contrast showed no evidence of osteomyelitis; showed nonspecific muscular edema consistent with myositis.  Currently on broad-spectrum antibiotics.  -Right [name] was normal.  -Podiatry recommended no surgical intervention and cleared the patient for discharge on oral doxycycline for 10 days with outpatient follow-up with podiatry.  He will be discharged home today.  Patient for surgical  intervention  Diabetes mellitus type 2 -Continue home regimen.  Carb modified diet.  Essential hypertension -Blood pressure stable.  Resume home regimen  Hyperlipidemia -Resume statin   Alcohol abuse -Continue folic acid, multivitamin thiamine on discharge.  Patient needs to abstain from alcohol use.  Obesity -Outpatient follow-up  Chronic pain Anxiety -Continue home regimen.  Outpatient follow-up with PCP/pain management  Discharge Instructions  Discharge Instructions     Diet - low sodium heart healthy   Complete by: As directed    Diet Carb Modified   Complete by: As directed    Discharge wound care:   Complete by: As directed    As per podiatry recommendations   Increase activity slowly   Complete by: As directed       Allergies as of 10/14/2022   No Known Allergies      Medication List     STOP taking these medications    cefdinir 300 MG capsule Commonly known as: OMNICEF       TAKE these medications    acetaminophen 325 MG tablet Commonly known as: TYLENOL Take 2 tablets (650 mg total) by mouth every 6 (six) hours as needed for mild pain.   albuterol 108 (90 Base) MCG/ACT inhaler Commonly known as: VENTOLIN HFA Inhale 1-2 puffs into the lungs every 4 (four) hours as needed for wheezing or shortness of breath.   ALPRAZolam 1 MG tablet Commonly known as: XANAX Take 1 mg by mouth 4 (four) times daily.   amitriptyline 100 MG tablet Commonly known as: ELAVIL Take 100 mg by mouth at bedtime.   amLODipine 10 MG tablet Commonly known as: NORVASC Take 1 tablet (10 mg total) by mouth daily.   diclofenac Sodium 1 % Gel Commonly known as: VOLTAREN Apply 4 g topically 4 (four) times daily as needed (Apply  to painful area over left upper front chest.).   doxycycline 100 MG tablet Commonly known as: VIBRA-TABS Take 1 tablet (100 mg total) by mouth 2 (two) times daily.   folic acid 1 MG tablet Commonly known as: FOLVITE Take 1 tablet (1 mg  total) by mouth daily.   lisinopril 10 MG tablet Commonly known as: ZESTRIL Take 1 tablet (10 mg total) by mouth daily.   metFORMIN 500 MG tablet Commonly known as: GLUCOPHAGE Take 500 mg by mouth 2 (two) times daily.   morphine 30 MG 12 hr tablet Commonly known as: MS CONTIN Take 30 mg by mouth every 12 (twelve) hours.   multivitamin with minerals Tabs tablet Take 1 tablet by mouth daily.   oxycodone 30 MG immediate release tablet Commonly known as: ROXICODONE Take 30 mg by mouth 3 (three) times daily.   rosuvastatin 10 MG tablet Commonly known as: CRESTOR Take 1 tablet (10 mg total) by mouth daily.   thiamine 100 MG tablet Commonly known as: VITAMIN B1 Take 1 tablet (100 mg total) by mouth daily.               Discharge Care Instructions  (From admission, onward)           Start     Ordered   10/14/22 0000  Discharge wound care:       Comments: As per podiatry recommendations   10/14/22 0949            Follow-up Information     Redmond School, MD. Schedule an appointment as soon as possible for a visit in 1 week(s).   Specialty: Internal Medicine Contact information: [address] [place] [address] [phone]         [name], DPM. Schedule an appointment as soon as possible for a visit in 1 week(s).   Specialty: Podiatry Contact information: [place] [address] [phone]                No Known Allergies  Consultations: Podiatry   Procedures/Studies: VAS [place] [name] WITH/WO TBI  Result Date: 10/13/2022  LOWER EXTREMITY DOPPLER STUDY Patient Name:  Eugene Diaz  Date of Exam:   10/13/2022 Medical Rec #: [id number]        Accession #:    [id number] Date of Birth: May 20, 1966        Patient Gender: M Patient Age:   78 years Exam Location:  [facility] Procedure:      VAS [place] [name] WITH/WO TBI Referring Phys: KEN LE  --------------------------------------------------------------------------------  Indications: Rest pain, ulceration, and RLE gangrene to toes - post op TMA. left              BKA and right transmetatarsal amputation High Risk         Hypertension, hyperlipidemia, Diabetes, coronary artery Factors:          disease.  Comparison Study: 12/06/21 prior Performing Technologist: Archie Patten RVS  Examination Guidelines: A complete evaluation includes at minimum, Doppler waveform signals and systolic blood pressure reading at the level of bilateral brachial, anterior tibial, and posterior tibial arteries, when vessel segments are accessible. Bilateral testing is considered an integral part of a complete examination. Photoelectric Plethysmograph (PPG) waveforms and toe systolic pressure readings are included as required and additional duplex testing as needed. Limited examinations for reoccurring indications may be performed as noted.  [name] Findings: +---------+------------------+-----+---------+--------+ Right    Rt Pressure (mmHg)IndexWaveform Comment  +---------+------------------+-----+---------+--------+ Brachial 146  triphasic         +---------+------------------+-----+---------+--------+ PTA      184               1.26 triphasic         +---------+------------------+-----+---------+--------+ DP       159               1.09 triphasic         +---------+------------------+-----+---------+--------+ Great Toe                                TMA      +---------+------------------+-----+---------+--------+ +--------+------------------+-----+--------+-------+ Left    Lt Pressure (mmHg)IndexWaveformComment +--------+------------------+-----+--------+-------+ [id number]                                    +--------+------------------+-----+--------+-------+ PTA                                    BKA     +--------+------------------+-----+--------+-------+ DP                                      BKA     +--------+------------------+-----+--------+-------+ +-------+-----------+-----------+------------+------------+ [name]/TBIToday's ABIToday's TBIPrevious ABIPrevious TBI +-------+-----------+-----------+------------+------------+ Right  1.26       TMA                                 +-------+-----------+-----------+------------+------------+ Left   BKA                                            +-------+-----------+-----------+------------+------------+  Summary: Right: Resting right ankle-brachial index is within normal range. Left: BKA. *See table(s) above for measurements and observations.  Electronically signed by Monica Martinez MD on 10/13/2022 at 12:34:04 PM.    Final    MR FOOT RIGHT WO CONTRAST  Result Date: 10/13/2022 CLINICAL DATA:  Worsening erythema at right foot wound status post transmetatarsal amputation 12/04/2021. Osteomyelitis suspected. Diabetes. EXAM: MRI OF THE RIGHT FOREFOOT WITHOUT CONTRAST TECHNIQUE: Multiplanar, multisequence MR imaging of the right forefoot was performed. No intravenous contrast was administered. COMPARISON:  Radiographs 10/12/2022 and 05/20/2022.  MRI 05/20/2022. FINDINGS: Despite efforts by the technologist and patient, mild to moderate motion artifact is present on today's exam and could not be eliminated. This reduces exam sensitivity and specificity. Bones/Joint/Cartilage Unchanged appearance of the remaining metatarsal status post transmetatarsal amputation. No progressive cortical destruction or T1 signal abnormality identified. Previously demonstrated mild T2 hyperintensity within the distal shafts of the 3rd through 5th metatarsals has improved. Contrast was not administered for today's examination. The visualized tarsal bones appear normal. The alignment is normal at the Lisfranc joint. No significant joint effusion. Ligaments The Lisfranc ligament is intact. Muscles and Tendons Nonspecific  muscular edema consistent with myositis. No significant tenosynovitis. Soft tissues Chronic soft tissue ulceration along the plantar aspect of the remaining 1st metatarsal is similar to prior examination. No underlying focal fluid collection is identified. Generalized subcutaneous edema in the dorsum of the foot has improved from the prior study. IMPRESSION: 1. Similar appearance of  the right forefoot status post transmetatarsal amputation. Previously demonstrated mild marrow T2 hyperintensity within the metatarsal shafts has improved, and there is no T1 marrow abnormality or progressive cortical destruction to suggest osteomyelitis. 2. Interval improvement in nonspecific subcutaneous edema in the dorsum of the foot. No focal fluid collection identified to suggest abscess. 3. Nonspecific muscular edema consistent with myositis. Electronically Signed   By: Richardean Sale M.D.   On: 10/13/2022 08:30   DG Foot Complete Right  Result Date: 10/12/2022 CLINICAL DATA:  Infection. Right foot wound. EXAM: RIGHT FOOT COMPLETE - 3+ VIEW COMPARISON:  Most recent radiograph 05/20/2022 FINDINGS: Previous transmetatarsal amputation of all 5 rays. Heterotopic calcification of the PTA shin site. No erosive change or bony destruction. Skin and soft tissue irregularity about the stump both medial and laterally. Linear density in the medial foot about the first ray may be related to overlying dressing or soft tissue calcification. No radiopaque foreign body. No soft tissue gas. IMPRESSION: 1. Soft tissue irregularity about the stump of the foot consistent with wound. No radiographic findings of osteomyelitis. 2. Previous transmetatarsal amputation of all 5 rays. Electronically Signed   By: Keith Rake M.D.   On: 10/12/2022 17:19      Subjective: Patient seen and examined at bedside at his mother and feels okay to go home today.  Discharge Exam: Vitals:   10/13/22 2056 10/14/22 0810  BP: 131/62 (!) 123/92  Pulse:  70 74  Resp:    Temp: 98.6 F (37 C) 98 F (36.7 C)  SpO2: 93% 97%    General: Pt is alert, awake, not in acute distress.  Currently on room air.  Looks chronically ill and deconditioned but slow to respond. Cardiovascular: rate controlled, S1/S2 + Respiratory: bilateral decreased breath sounds at bases Abdominal: Soft, NT, ND, bowel sounds + Extremities: Left BKA present; right metatarsal amputation with improving erythema.    The results of significant diagnostics from this hospitalization (including imaging, microbiology, ancillary and laboratory) are listed below for reference.     Microbiology: Recent Results (from the past 240 hour(s))  Blood culture (routine x 2)     Status: None (Preliminary result)   Collection Time: 10/12/22  4:41 PM   Specimen: BLOOD RIGHT FOREARM  Result Value Ref Range Status   Specimen Description BLOOD RIGHT FOREARM  Final   Special Requests   Final    BOTTLES DRAWN AEROBIC AND ANAEROBIC Blood Culture adequate volume   Culture   Final    NO GROWTH 2 DAYS Performed at [facility], [address], [place], [place] [address]    Report Status PENDING  Incomplete  Blood culture (routine x 2)     Status: None (Preliminary result)   Collection Time: 10/12/22 10:25 PM   Specimen: BLOOD RIGHT HAND  Result Value Ref Range Status   Specimen Description BLOOD RIGHT HAND  Final   Special Requests   Final    BOTTLES DRAWN AEROBIC AND ANAEROBIC Blood Culture results may not be optimal due to an inadequate volume of blood received in culture bottles   Culture   Final    NO GROWTH 2 DAYS Performed at [facility], [place] [address], [place], [place] [address]    Report Status PENDING  Incomplete  MRSA Next Gen by PCR, Nasal     Status: None   Collection Time: 10/13/22  4:10 PM   Specimen: Nasal Mucosa; Nasal Swab  Result Value Ref Range Status   MRSA by  PCR Next Gen NOT DETECTED NOT DETECTED Final    Comment: (NOTE) The GeneXpert MRSA Assay  (FDA approved for NASAL specimens only), is one component of a comprehensive MRSA colonization surveillance program. It is not intended to diagnose MRSA infection nor to guide or monitor treatment for MRSA infections. Test performance is not FDA approved in patients less than 62 years old. Performed at [facility], [place] [address] [place], [place] [address]      Labs: BNP (last 3 results) No results for input(s): "BNP" in the last 8760 hours. Basic Metabolic Panel: Recent Labs  Lab 10/12/22 1641 10/12/22 2225 10/13/22 1243 10/14/22 0409  NA 137  --  137 139  K 4.2  --  4.2 4.0  CL 106  --  104 105  CO2 21*  --  24 25  GLUCOSE 114*  --  112* 126*  BUN 9  --  9 11  CREATININE 0.71  --  0.80 0.86  CALCIUM 9.2  --  9.1 9.0  MG  --  1.9 1.8  --    Liver Function Tests: Recent Labs  Lab 10/13/22 1243  AST 11*  ALT 9  ALKPHOS 76  BILITOT 0.5  PROT 6.6  ALBUMIN 3.1*   No results for input(s): "LIPASE", "AMYLASE" in the last 168 hours. No results for input(s): "AMMONIA" in the last 168 hours. CBC: Recent Labs  Lab 10/12/22 1641 10/13/22 0415 10/14/22 0409  WBC 9.6 6.8 7.0  NEUTROABS 5.3  --   --   HGB 16.5 15.8 16.1  HCT 48.9 47.4 47.8  MCV 92.4 94.4 93.4  PLT 208 188 163   Cardiac Enzymes: No results for input(s): "CKTOTAL", "CKMB", "CKMBINDEX", "TROPONINI" in the last 168 hours. BNP: Invalid input(s): "POCBNP" CBG: Recent Labs  Lab 10/13/22 1235 10/13/22 1616 10/13/22 1950 10/13/22 2348 10/14/22 0544  GLUCAP 114* 251* 197* 139* 112*   D-Dimer No results for input(s): "DDIMER" in the last 72 hours. Hgb A1c Recent Labs    10/12/22 1641  HGBA1C 6.1*   Lipid Profile No results for input(s): "CHOL", "HDL", "LDLCALC", "TRIG", "CHOLHDL", "LDLDIRECT" in the last 72 hours. Thyroid function studies No results for input(s): "TSH", "T4TOTAL", "T3FREE", "THYROIDAB" in the last 72 hours.  Invalid input(s): "FREET3" Anemia work up No results for  input(s): "VITAMINB12", "FOLATE", "FERRITIN", "TIBC", "IRON", "RETICCTPCT" in the last 72 hours. Urinalysis    Component Value Date/Time   COLORURINE STRAW (A) 03/29/2021 1705   APPEARANCEUR CLEAR 03/29/2021 1705   LABSPEC 1.010 03/29/2021 1705   PHURINE 6.0 03/29/2021 1705   GLUCOSEU NEGATIVE 03/29/2021 1705   HGBUR MODERATE (A) 03/29/2021 1705   BILIRUBINUR NEGATIVE 03/29/2021 1705   KETONESUR NEGATIVE 03/29/2021 1705   PROTEINUR NEGATIVE 03/29/2021 1705   UROBILINOGEN 4.0 (H) 10/09/2008 1114   NITRITE NEGATIVE 03/29/2021 1705   LEUKOCYTESUR NEGATIVE 03/29/2021 1705   Sepsis Labs Recent Labs  Lab 10/12/22 1641 10/13/22 0415 10/14/22 0409  WBC 9.6 6.8 7.0   Microbiology Recent Results (from the past 240 hour(s))  Blood culture (routine x 2)     Status: None (Preliminary result)   Collection Time: 10/12/22  4:41 PM   Specimen: BLOOD RIGHT FOREARM  Result Value Ref Range Status   Specimen Description BLOOD RIGHT FOREARM  Final   Special Requests   Final    BOTTLES DRAWN AEROBIC AND ANAEROBIC Blood Culture adequate volume   Culture   Final    NO GROWTH 2 DAYS Performed at [facility], [address], [place], [place] [address]    Report Status PENDING  Incomplete  Blood culture (routine x 2)     Status: None (Preliminary result)   Collection Time: 10/12/22 10:25 PM   Specimen: BLOOD RIGHT HAND  Result Value Ref Range Status   Specimen Description BLOOD RIGHT HAND  Final   Special Requests   Final    BOTTLES DRAWN AEROBIC AND ANAEROBIC Blood Culture results may not be optimal due to an inadequate volume of blood received in culture bottles   Culture   Final    NO GROWTH 2 DAYS Performed at [facility], [place] [address], [place], [place] [address]    Report Status PENDING  Incomplete  MRSA Next Gen by PCR, Nasal     Status: None   Collection Time: 10/13/22  4:10 PM   Specimen: Nasal Mucosa; Nasal Swab  Result Value Ref Range Status   MRSA by PCR Next Gen  NOT DETECTED NOT DETECTED Final    Comment: (NOTE) The GeneXpert MRSA Assay (FDA approved for NASAL specimens only), is one component of a comprehensive MRSA colonization surveillance program. It is not intended to diagnose MRSA infection nor to guide or monitor treatment for MRSA infections. Test performance is not FDA approved in patients less than 56 years old. Performed at [facility], [place] [address] [place], [place] [address]      Time coordinating discharge: 35 minutes  SIGNED:   Aline August, MD  [facility] 10/14/2022, 10:24 AM

## 2022-10-17 LAB — CULTURE, BLOOD (ROUTINE X 2)
Culture: NO GROWTH
Culture: NO GROWTH
Special Requests: ADEQUATE

## 2022-10-18 DIAGNOSIS — E1165 Type 2 diabetes mellitus with hyperglycemia: Secondary | ICD-10-CM | POA: Diagnosis not present

## 2022-10-18 DIAGNOSIS — I10 Essential (primary) hypertension: Secondary | ICD-10-CM | POA: Diagnosis not present

## 2022-10-18 DIAGNOSIS — G629 Polyneuropathy, unspecified: Secondary | ICD-10-CM | POA: Diagnosis not present

## 2022-10-19 ENCOUNTER — Encounter: Payer: Self-pay | Admitting: Infectious Disease

## 2022-10-19 ENCOUNTER — Other Ambulatory Visit: Payer: Self-pay

## 2022-10-19 ENCOUNTER — Ambulatory Visit (INDEPENDENT_AMBULATORY_CARE_PROVIDER_SITE_OTHER): Payer: Medicare Other | Admitting: Infectious Disease

## 2022-10-19 VITALS — BP 150/78 | HR 88 | Temp 98.0°F | Ht 74.0 in | Wt 245.0 lb

## 2022-10-19 DIAGNOSIS — R413 Other amnesia: Secondary | ICD-10-CM | POA: Diagnosis not present

## 2022-10-19 DIAGNOSIS — M86271 Subacute osteomyelitis, right ankle and foot: Secondary | ICD-10-CM

## 2022-10-19 DIAGNOSIS — F1020 Alcohol dependence, uncomplicated: Secondary | ICD-10-CM

## 2022-10-19 DIAGNOSIS — Z7185 Encounter for immunization safety counseling: Secondary | ICD-10-CM

## 2022-10-19 DIAGNOSIS — L03116 Cellulitis of left lower limb: Secondary | ICD-10-CM

## 2022-10-19 DIAGNOSIS — G629 Polyneuropathy, unspecified: Secondary | ICD-10-CM | POA: Diagnosis not present

## 2022-10-19 DIAGNOSIS — M86172 Other acute osteomyelitis, left ankle and foot: Secondary | ICD-10-CM

## 2022-10-19 DIAGNOSIS — L03115 Cellulitis of right lower limb: Secondary | ICD-10-CM

## 2022-10-19 MED ORDER — AMOXICILLIN-POT CLAVULANATE 875-125 MG PO TABS: 1.0000 | ORAL_TABLET | Freq: Two times a day (BID) | ORAL | 5 refills | Status: DC

## 2022-10-19 MED ORDER — DOXYCYCLINE HYCLATE 100 MG PO TABS: 100.0000 mg | ORAL_TABLET | Freq: Two times a day (BID) | ORAL | 5 refills | Status: DC

## 2022-10-19 NOTE — Progress Notes (Signed)
Subjective:  Chief complaint: Follow-up for chronic wound infection with history of osteomyelitis in the context of alcoholic neuropathy   Patient ID: Eugene Diaz, male    DOB: 1966/09/27, 56 y.o.   MRN: [id number]  HPI  Eugene Diaz is a 56 y.o. male alcoholic neuropathy diabetes mellitus and osteomyelitis who is well-known to me from my having seen him as an inpatient and followed him in our clinic.  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  He had additional further debridement as well with bone biopsy of the first metatarsal and wound closure.  He grew Eugene Diaz group B streptococcus as well as beta-lactamase producing Eugene Diaz.  He was narrowed to intravenous cefazolin with oral metronidazole and discharged home with this with his sister helping him with antibiotics.  We switched him over the IV therapy to Augmentin and he was followed by Dr. March Diaz by podiatry.  He had some significant problems with depression and passive suicidal ideation and related to the loss of his wife many years ago.  He improved significantly emotionally and actually had reduced his alcohol intake drastically.  He is not completely clean but his intake is dramatically less than in the past when he drank on a daily basis.  Followed him closely and saw him last in November 2022.   He came off his antibiotics and he came back and was seen by my partner Dr. Linus Diaz and seem to have no evidence of active infection at that point in time.   Unfortunately interim he developed worsening gangrenous changes involving the third toe and underwent transmetatarsal amputation with Dr. Cannon Diaz on December 04, 2021.  Apparently approxi a month ago his foot  got caught on a nail that was sticking out of his porch.  He developed intermittent redness and swelling that worsened recently and was convinced by family come to the emergency department with worsening flexion of his feet.  His foot was warm and swollen and erythematous.  MRI was performed at [facility] which showed a new soft tissue ulcer in the medial forefoot distal to the coronal and medial to the first metatarsal amputation stump.  There is edema and swelling of the ankle and dorsal midfoot and there is marrow edema within the third and fourth and remaining fifth metatarsal shafts as well as some mild edema in the distal aspect the remaining first and second metatarsal shafts all concerning for osteomyelitis.   Patient was seen by general surgery at [facility] he did not want to intervene surgically.  They wanted to try antibiotics.  I was very skeptical that antibiotics were going to get [place] very far but I i ertapenem with doxycycline June 30, 2022.  In the interim he was hospitalized with an episode of unresponsiveness that was thought to be due to too many sedating medications from home though he is seen Eugene Diaz with cardiology who seems to think the patient had a near syncopal event rather than a drug related overdose.  Patient has had a monitor on since then.  He is also seen Eugene Diaz for his foot but it seems that Eugene Diaz is not aware (based on his notes prior to my last visiit  the patient's recent hospital admission  or MRI.  He has completed IV antibiotics and was on oral doxycycline and cefdinir.  His wound  continued to improve and he is continue to follow with Eugene Diaz.  Dr Posey Diaz did not feel that the MRI findings correlated with exam and that he was skeptical of osteomyelitis.   Had seen Eugene Diaz in September I thought we were stopping antibiotics but turns out his pharmacy was still sending cefdinir and doxycycline at last visit in November I again thought we had  conclusively stopped both antibiotics but I again see that they are both being filled Eugene Diaz is not sure whether he is actually taking them or getting them but certainly the dispense history shows that Eugene Diaz is feeling them every month.  In the interim was hospitalized in December 13 with foot swelling increased pain erythema and warmth and worsening serous drainage from his foot wounds.  He had presented to the podiatry clinic and after they examined him they directed him to come to the emergency department for management of infection and initiation of IV antibiotics.   Was started vancomycin cefepime and Flagyl in the ER.   MRI of the right foot was performed which showed onyx soft tissue and ulceration with mild marrow T2 hypertensive in the metatarsal shafts 3-5th  that had improved in the right forefoot and no T1 abnormality or progressive cortical destruction seen with improvement in subcutaneous edema on dorsum of the foot.   Dietary saw him and felt no indication for surgical intervention and that this was "only cellulitis.  Patient was sent home on doxycycline.  He continues to have drainage from the 2 ulcers and the wounds appear worse to me than when I last examined him.  [name] cannot really remember much about why he was hospitalized and says his memory is getting worse.  Has developed an ulceration on his left ankle where of the foot brace seems to be rubbing up against the skin.      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[place]) 05/23/2022   Depression    Diabetic neuropathy ([place])    Edema of both lower extremities    Essential tremor    neurologist-- dr patel--- due to alcohol abuse   GERD (gastroesophageal reflux disease)    History of cellulitis 2020    left lower leg;   recurrent 03-25-2020   History of esophageal stricture    s/p  dilatation 02-13-2018   History of osteomyelitis 2020   left great toe     Hypercholesterolemia    Hypertension    followed by pcp  (nuclear stress test 03-11-2014 low risk w/ no ischemia, ef 65%   Normocytic anemia    followed by pcp   (03-18-2020 had transfusion's 02/ 2021)   Osteomyelitis of great toe of right foot ([place]) 08/18/2021   Peripheral vascular disease ([place])    Severe depression ([place]) 06/09/2021   Status post incision and drainage followed by Dr Eugene Diaz and pcp   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   (03-17-2020  pt states most of incision is healed with exception an area that is still draining,  daily dressing change),  foot is red but not warm to the touch and has swelling  but has improved)   Suicidal ideation 06/09/2021   Syncope and collapse 06/07/2022   Syncope, near 06/20/2022   Thrombocytopenia ([place])    chronic   Type 2 diabetes mellitus ([place])    followed by pcp---    (03-18-2020  pt stated does not check blood sugar)   Vaccine counseling 08/29/2022    Past Surgical History:  Procedure Laterality Date   AMPUTATION Left 10/16/2019   Procedure: Left partial second ray resection; placement of antibiotic beads;  Surgeon: Evelina Bucy, DPM;  Location: [facility];  Service: Podiatry;  Laterality: Left;   AMPUTATION Left 11/13/2019   Procedure: Left Midfoot Amputation - Transmetatarsal vs. Lisfranc; Placement antibiotic beads;  Surgeon: Evelina Bucy, DPM;  Location: [facility];  Service: Podiatry;  Laterality: Left;   AMPUTATION Left 12/21/2019   Procedure: Chopart Amputation left foot;  Surgeon: Evelina Bucy, DPM;  Location: [place];  Service: Podiatry;  Laterality: Left;   AMPUTATION Left 06/24/2020   Procedure: LEFT BELOW KNEE AMPUTATION;  Surgeon: Newt Minion, MD;  Location: [place];  Service: Orthopedics;  Laterality: Left;   AMPUTATION Right 03/29/2021   Procedure: AMPUTATION RAY;   Surgeon: Evelina Bucy, DPM;  Location: [place];  Service: Podiatry;  Laterality: Right;   AMPUTATION TOE Left 08/14/2019   Procedure: AMPUTATION GREAT TOE;  Surgeon: Evelina Bucy, DPM;  [facility]: [facility];  Service: Podiatry;  Laterality: Left;   APPLICATION OF WOUND VAC Left 12/21/2019   Procedure: Application Of Wound Vac;  Surgeon: Evelina Bucy, DPM;  Location: [place];  Service: Podiatry;  Laterality: Left;   BIOPSY  02/13/2018   Procedure: BIOPSY;  Surgeon: Danie Binder, MD;  [facility];  Service: Endoscopy;;  transverse colon biopsy, gastric biopsy   COLONOSCOPY WITH PROPOFOL N/A 02/13/2018   Procedure: COLONOSCOPY WITH PROPOFOL;  Surgeon: Danie Binder, MD;  [facility];  Service: Endoscopy;  Laterality: N/A;  11:15am   ESOPHAGOGASTRODUODENOSCOPY (EGD) WITH PROPOFOL N/A 02/13/2018   Procedure: ESOPHAGOGASTRODUODENOSCOPY (EGD) WITH PROPOFOL;  Surgeon: Danie Binder, MD;  [facility];  Service: Endoscopy;  Laterality: N/A;   I & D EXTREMITY Left 07/16/2019   Procedure: Insicion  AND DEBRIDEMENT EXTREMITY;  Surgeon: Evelina Bucy, DPM;  Location: [place];  Service: Podiatry;  Laterality: Left;   I & D EXTREMITY Left 06/22/2020   Procedure: IRRIGATION AND DEBRIDEMENT EXTREMITY, left foot and ankle;  Surgeon: Evelina Bucy, DPM;  Location: [place];  Service: Podiatry;  Laterality: Left;   I & D EXTREMITY Right 03/29/2021   Procedure: IRRIGATION AND DEBRIDEMENT EXTREMITY;  Surgeon: Evelina Bucy, DPM;  Location: [place];  Service: Podiatry;  Laterality: Right;   INCISION AND DRAINAGE OF WOUND Left 10/13/2019   Procedure: IRRIGATION AND DEBRIDEMENT WOUND OF LEFT FOOT AND FIRST METATARSAL RESECTION;  Surgeon: Evelina Bucy, DPM;  [facility];  Service: Podiatry;  Laterality: Left;   IRRIGATION AND DEBRIDEMENT FOOT Left 11/11/2019   Procedure: Left Foot Wound Irrigation and Debridement;  Surgeon: Evelina Bucy, DPM;  [facility];   Service: Podiatry;  Laterality: Left;   Left arm     Left arm repair (tendon and artery)   PERCUTANEOUS PINNING  07/16/2019   Procedure: Open Reduction Percutaneous Pinning Extremity;  Surgeon: Evelina Bucy, DPM;  Location: [place];  Service: Podiatry;;   PILONIDAL CYST EXCISION N/A 08/08/2014   Procedure: CYST EXCISION PILONIDAL EXTENSIVE;  Surgeon: Jamesetta So,  MD;  [facility]: [facility];  Service: General;  Laterality: N/A;   POLYPECTOMY  02/13/2018   Procedure: POLYPECTOMY;  Surgeon: Danie Binder, MD;  [facility];  Service: Endoscopy;;  transverse colon polyp hs, rectal polyps times 2   SAVORY DILATION N/A 02/13/2018   Procedure: SAVORY DILATION;  Surgeon: Danie Binder, MD;  [facility];  Service: Endoscopy;  Laterality: N/A;   TENDON LENGTHENING Right 12/04/2021   Procedure: TENDON LENGTHENING;  Surgeon: Landis Martins, DPM;  Location: [place];  Service: Podiatry;  Laterality: Right;   TRANSMETATARSAL AMPUTATION Right 12/04/2021   Procedure: TRANSMETATARSAL AMPUTATION;  Surgeon: Landis Martins, DPM;  Location: [place];  Service: Podiatry;  Laterality: Right;   WOUND DEBRIDEMENT Left 09/04/2019   Procedure: DEBRIDEMENT WOUND WITH COMPLEX  REPAIR OF DEHISCENCE;  Surgeon: Evelina Bucy, DPM;  Location: [place];  Service: Podiatry;  Laterality: Left;  Leave patient on stretcher   WOUND DEBRIDEMENT Left 10/16/2019   Procedure: Left foot wound debridement and closure;  Surgeon: Evelina Bucy, DPM;  [facility];  Service: Podiatry;  Laterality: Left;   WOUND DEBRIDEMENT Left 12/18/2019   Procedure: LEFT FOOT DEBRIDEMENT WITH PARTIAL INCISION OF INFECTED BONE;  Surgeon: Evelina Bucy, DPM;  Location: [place];  Service: Podiatry;  Laterality: Left;  LEFT FOOT DEBRIDEMENT WITH PARTIAL INCISION OF INFECTED BONE   WOUND DEBRIDEMENT Right 04/01/2021   Procedure: Right foot wound debridement and irrigation and closure, bone biopsy;  Surgeon: Evelina Bucy, DPM;  Location: [place];  Service: Podiatry;  Laterality: Right;    Family History  Problem Relation Age of Onset   Heart attack Mother        Living, 83   Cancer Father        Deceased   Healthy Brother    Healthy Sister    Colon cancer Neg Hx    Colon polyps Neg Hx       Social History   Socioeconomic History   Marital status: Widowed    Spouse name: Not on file   Number of children: 0   Years of education: Not on file   Highest education level: 8th grade  Occupational History   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  Tobacco Use   Smoking status: Some Days    Packs/day: 2.50    Years: 25.00    Total pack years: 62.50    Types: Cigarettes, Cigars    Last attempt to quit: 08/05/1984    Years since quitting: 38.2   Smokeless tobacco: Never   Tobacco comments:    Quit smoking cigarettes; now smokes cigars, wants to quit  Vaping Use   Vaping Use: Never used  Substance and Sexual Activity   Alcohol use: Yes    Alcohol/week: 50.0 standard drinks of alcohol    Types: 50 Standard drinks or equivalent per week    Comment: Drinks 6 beers, liquor 4-6oz (both daily) x 35 years; now states he "drinks a few beers and let it go"   Drug use: Not Currently    Types: Marijuana   Sexual activity: Yes    Birth control/protection: None  Other Topics Concern   Not on file  Social History Narrative   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 Highest level of education:  8th grade   He lives with wife.  No children.   Left handed   Drinks soda,  water-no coffee, no tea   Social Determinants of Health   Financial Resource Strain: Not on file  Food Insecurity: No Food Insecurity (10/13/2022)   Hunger Vital Sign    Worried About Running Out of Food in the Last Year: Never true    Ran Out of Food in the Last Year: Never true  Transportation Needs: No Transportation Needs (10/13/2022)   PRAPARE -  Hydrologist (Medical): No    Lack of Transportation (Non-Medical): No  Physical Activity: Not on file  Stress: Not on file  Social Connections: Not on file    No Known Allergies   Current Outpatient Medications:    albuterol (VENTOLIN HFA) 108 (90 Base) MCG/ACT inhaler, Inhale 1-2 puffs into the lungs every 4 (four) hours as needed for wheezing or shortness of breath., Disp: , Rfl:    ALPRAZolam (XANAX) 1 MG tablet, Take 1 mg by mouth 4 (four) times daily., Disp: , Rfl:    amitriptyline (ELAVIL) 100 MG tablet, Take 100 mg by mouth at bedtime., Disp: , Rfl:    amLODipine (NORVASC) 10 MG tablet, Take 1 tablet (10 mg total) by mouth daily., Disp: 30 tablet, Rfl: 0   diclofenac Sodium (VOLTAREN) 1 % GEL, Apply 4 g topically 4 (four) times daily as needed (Apply to painful area over left upper front chest.)., Disp: 50 g, Rfl: 0   doxycycline (VIBRA-TABS) 100 MG tablet, Take 1 tablet (100 mg total) by mouth 2 (two) times daily., Disp: 20 tablet, Rfl: 0   folic acid (FOLVITE) 1 MG tablet, Take 1 tablet (1 mg total) by mouth daily., Disp: 30 tablet, Rfl: 0   lisinopril (ZESTRIL) 10 MG tablet, Take 1 tablet (10 mg total) by mouth daily., Disp: 90 tablet, Rfl: 3   metFORMIN (GLUCOPHAGE) 500 MG tablet, Take 500 mg by mouth 2 (two) times daily., Disp: , Rfl:    morphine (MS CONTIN) 30 MG 12 hr tablet, Take 30 mg by mouth every 12 (twelve) hours., Disp: , Rfl:    Multiple Vitamin (MULTIVITAMIN WITH MINERALS) TABS tablet, Take 1 tablet by mouth daily., Disp: , Rfl:    oxycodone (ROXICODONE) 30 MG immediate release tablet, Take 30 mg by mouth 3 (three) times daily., Disp: , Rfl:    rosuvastatin (CRESTOR) 10 MG tablet, Take 1 tablet (10 mg total) by mouth daily., Disp: 90 tablet, Rfl: 3   thiamine 100 MG tablet, Take 1 tablet (100 mg total) by mouth daily., Disp: 30 tablet, Rfl: 0   acetaminophen (TYLENOL) 325 MG tablet, Take 2 tablets (650 mg total) by mouth every 6 (six)  hours as needed for mild pain. (Patient not taking: Reported on 10/19/2022), Disp: , Rfl:    Review of Systems  Unable to perform ROS: Other       Objective:   Physical Exam Constitutional:      Appearance: He is well-developed.  HENT:     Head: Normocephalic and atraumatic.  Eyes:     Conjunctiva/sclera: Conjunctivae normal.  Cardiovascular:     Rate and Rhythm: Normal rate and regular rhythm.  Pulmonary:     Effort: Pulmonary effort is normal. No respiratory distress.     Breath sounds: No wheezing.  Abdominal:     General: There is no distension.     Palpations: Abdomen is soft.  Musculoskeletal:        General: No tenderness. Normal range of motion.     Cervical back: Normal range of motion and neck  supple.  Skin:    General: Skin is warm and dry.     Coloration: Skin is not pale.     Findings: No erythema or rash.  Neurological:     General: No focal deficit present.     Mental Status: He is alert and oriented to person, place, and time.  Psychiatric:        Mood and Affect: Mood normal.        Behavior: Behavior normal.        Thought Content: Thought content normal.        Cognition and Memory: He exhibits impaired recent memory and impaired remote memory.        Judgment: Judgment normal.     Right lower extremity 06/20/2022:    Right foot 07/20/2022:    Foot 08/29/2022:     10/19/2022:        New ulcer right ankle:    Assessment & Plan:   Osteomyelitis involving the right foot:  Despite the fact the MRI looks better I still am very concerned that he has infection in the bones in his feet and that that is the reason why he is having these recurrent episodes of "cellulitis.  It is quite disconcerting that he would be admitted to the hospital with progressive infection even if it is the soft tissue in the face of cefdinir and doxycycline if he is actually getting those antibiotics.   Repeat a sed rate CRP CMP with GFR and CBC with  differential I am going to send in some Augmentin to his pharmacy along with doxycycline and asked that they stop the cefdinir I have scheduled him to see me for follow-up in January.  Memory problems: ? Related to chronic alcohol abuse  Neuropathy from etoh abuse:stable  I spent 44 minutes with the patient including than 50% of the time in face to face counseling of the patient re his osteomyelitis personally reviewing MRI of the foot along with review of medical records in preparation for the visit and during the visit and in coordination of his care.

## 2022-10-19 NOTE — Addendum Note (Signed)
Addended by: Caffie Pinto on: 10/19/2022 03:31 PM   Modules accepted: Orders

## 2022-10-19 NOTE — Addendum Note (Signed)
Addended by: Truman Hayward on: 10/19/2022 03:29 PM   Modules accepted: Orders

## 2022-10-20 LAB — CBC WITH DIFFERENTIAL/PLATELET
Absolute Monocytes: 319 {cells}/uL (ref 200–950)
Basophils Absolute: 68 {cells}/uL (ref 0–200)
Basophils Relative: 0.9 %
Eosinophils Absolute: 1041 {cells}/uL — ABNORMAL HIGH (ref 15–500)
Eosinophils Relative: 13.7 %
HCT: 47.1 % (ref 38.5–50.0)
Hemoglobin: 16 g/dL (ref 13.2–17.1)
Lymphs Abs: 1376 {cells}/uL (ref 850–3900)
MCH: 31.3 pg (ref 27.0–33.0)
MCHC: 34 g/dL (ref 32.0–36.0)
MCV: 92.2 fL (ref 80.0–100.0)
MPV: 11.2 fL (ref 7.5–12.5)
Monocytes Relative: 4.2 %
Neutro Abs: 4796 {cells}/uL (ref 1500–7800)
Neutrophils Relative %: 63.1 %
Platelets: 151 Thousand/uL (ref 140–400)
RBC: 5.11 Million/uL (ref 4.20–5.80)
RDW: 13 % (ref 11.0–15.0)
Total Lymphocyte: 18.1 %
WBC: 7.6 Thousand/uL (ref 3.8–10.8)

## 2022-10-20 LAB — COMPLETE METABOLIC PANEL WITHOUT GFR
AG Ratio: 1.1 (calc) (ref 1.0–2.5)
ALT: 9 U/L (ref 9–46)
AST: 11 U/L (ref 10–35)
Albumin: 3.6 g/dL (ref 3.6–5.1)
Alkaline phosphatase (APISO): 91 U/L (ref 35–144)
BUN/Creatinine Ratio: 16 (calc) (ref 6–22)
BUN: 10 mg/dL (ref 7–25)
CO2: 26 mmol/L (ref 20–32)
Calcium: 8.8 mg/dL (ref 8.6–10.3)
Chloride: 104 mmol/L (ref 98–110)
Creat: 0.63 mg/dL — ABNORMAL LOW (ref 0.70–1.30)
Globulin: 3.2 g/dL (ref 1.9–3.7)
Glucose, Bld: 151 mg/dL — ABNORMAL HIGH (ref 65–99)
Potassium: 4.2 mmol/L (ref 3.5–5.3)
Sodium: 139 mmol/L (ref 135–146)
Total Bilirubin: 0.6 mg/dL (ref 0.2–1.2)
Total Protein: 6.8 g/dL (ref 6.1–8.1)
eGFR: 111 mL/min/1.73m2

## 2022-10-20 LAB — C-REACTIVE PROTEIN: CRP: 38.3 mg/L — ABNORMAL HIGH (ref <8.0)

## 2022-10-20 LAB — SEDIMENTATION RATE: Sed Rate: 19 mm/h (ref 0–20)

## 2022-10-21 ENCOUNTER — Ambulatory Visit: Payer: Medicare Other | Admitting: Podiatry

## 2022-11-02 ENCOUNTER — Ambulatory Visit (INDEPENDENT_AMBULATORY_CARE_PROVIDER_SITE_OTHER): Payer: Medicare Other | Admitting: Podiatry

## 2022-11-02 VITALS — BP 128/70

## 2022-11-02 DIAGNOSIS — Z794 Long term (current) use of insulin: Secondary | ICD-10-CM

## 2022-11-02 DIAGNOSIS — L97512 Non-pressure chronic ulcer of other part of right foot with fat layer exposed: Secondary | ICD-10-CM

## 2022-11-02 DIAGNOSIS — E1111 Type 2 diabetes mellitus with ketoacidosis with coma: Secondary | ICD-10-CM

## 2022-11-02 NOTE — Progress Notes (Signed)
Subjective:  Patient ID: Eugene Diaz, male    DOB: 1965/12/28,  MRN: [id number]  Chief Complaint  Patient presents with   Foot Ulcer    57 y.o. male presents for wound care.  Patient presents with follow-up to right transmetatarsal amputation stump.  He states he ran out of VAC sponge so he went back to regular dressings.  Denies any other acute complaints   Review of Systems: Negative except as noted in the HPI. Denies N/V/F/Ch.  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[place]) 05/23/2022   Depression    Diabetic neuropathy ([place])    Edema of both lower extremities    Essential tremor    neurologist-- dr Burhanuddin Kohlmann--- due to alcohol abuse   GERD (gastroesophageal reflux disease)    History of cellulitis 2020   left lower leg;   recurrent 03-25-2020   History of esophageal stricture    s/p  dilatation 02-13-2018   History of osteomyelitis 2020   left great toe     Hypercholesterolemia    Hypertension    followed by pcp  (nuclear stress test 03-11-2014 low risk w/ no ischemia, ef 65%   Normocytic anemia    followed by pcp   (03-18-2020 had transfusion's 02/ 2021)   Osteomyelitis of great toe of right foot ([place]) 08/18/2021   Peripheral vascular disease ([place])    Severe depression ([place]) 06/09/2021   Status post incision and drainage followed by Dr [name] and pcp   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   (03-17-2020  pt states most of incision is healed with exception an area that is still draining,  daily dressing change),  foot is red but not warm to the touch and has swelling but has improved)   Suicidal ideation 06/09/2021   Syncope and collapse 06/07/2022   Syncope, near 06/20/2022   Thrombocytopenia ([place])    chronic   Type 2 diabetes mellitus ([place])    followed by  pcp---    (03-18-2020  pt stated does not check blood sugar)   Vaccine counseling 08/29/2022    Current Outpatient Medications:    acetaminophen (TYLENOL) 325 MG tablet, Take 2 tablets (650 mg total) by mouth every 6 (six) hours as needed for mild pain. (Patient not taking: Reported on 10/19/2022), Disp: , Rfl:    albuterol (VENTOLIN HFA) 108 (90 Base) MCG/ACT inhaler, Inhale 1-2 puffs into the lungs every 4 (four) hours as needed for wheezing or shortness of breath., Disp: , Rfl:    ALPRAZolam (XANAX) 1 MG tablet, Take 1 mg by mouth 4 (four) times daily., Disp: , Rfl:    amitriptyline (ELAVIL) 100 MG tablet, Take 100 mg by mouth at bedtime., Disp: , Rfl:    amLODipine (NORVASC) 10 MG tablet, Take 1 tablet (10 mg total) by mouth daily., Disp: 30 tablet, Rfl: 0   amoxicillin-clavulanate (AUGMENTIN) 875-125 MG tablet, Take 1 tablet by mouth 2 (two) times daily., Disp: 60 tablet, Rfl: 5   diclofenac Sodium (VOLTAREN) 1 % GEL, Apply 4 g topically 4 (four) times daily as needed (Apply to painful area over left upper front chest.)., Disp: 50 g, Rfl: 0   doxycycline (VIBRA-TABS) 100 MG tablet, Take 1 tablet (100 mg total)  by mouth 2 (two) times daily., Disp: 60 tablet, Rfl: 5   folic acid (FOLVITE) 1 MG tablet, Take 1 tablet (1 mg total) by mouth daily., Disp: 30 tablet, Rfl: 0   lisinopril (ZESTRIL) 10 MG tablet, Take 1 tablet (10 mg total) by mouth daily., Disp: 90 tablet, Rfl: 3   metFORMIN (GLUCOPHAGE) 500 MG tablet, Take 500 mg by mouth 2 (two) times daily., Disp: , Rfl:    morphine (MS CONTIN) 30 MG 12 hr tablet, Take 30 mg by mouth every 12 (twelve) hours., Disp: , Rfl:    Multiple Vitamin (MULTIVITAMIN WITH MINERALS) TABS tablet, Take 1 tablet by mouth daily., Disp: , Rfl:    oxycodone (ROXICODONE) 30 MG immediate release tablet, Take 30 mg by mouth 3 (three) times daily., Disp: , Rfl:    rosuvastatin (CRESTOR) 10 MG tablet, Take 1 tablet (10 mg total) by mouth daily., Disp: 90 tablet, Rfl: 3    thiamine 100 MG tablet, Take 1 tablet (100 mg total) by mouth daily., Disp: 30 tablet, Rfl: 0  Social History   Tobacco Use  Smoking Status Some Days   Packs/day: 2.50   Years: 25.00   Total pack years: 62.50   Types: Cigarettes, Cigars   Last attempt to quit: 08/05/1984   Years since quitting: 38.2  Smokeless Tobacco Never  Tobacco Comments   Quit smoking cigarettes; now smokes cigars, wants to quit    No Known Allergies Objective:   Vitals:   11/02/22 1437  BP: 128/70   There is no height or weight on file to calculate BMI. Constitutional Well developed. Well nourished.  Vascular Dorsalis pedis pulses palpable bilaterally. Posterior tibial pulses palpable bilaterally. Capillary refill normal to all digits.  No cyanosis or clubbing noted. Pedal hair growth normal.  Neurologic Normal speech. Oriented to person, place, and time. Protective sensation absent  Dermatologic Wound Location: Right transmetatarsal amputation site stump wound with fat layer exposed.  No purulent drainage noted does not probe down to bone. Wound Base: Mixed Granular/Fibrotic Peri-wound: Calloused Exudate: Scant/small amount Serosanguinous exudate Wound Measurements: -See above  Orthopedic: No pain to palpation either foot.   Radiographs: None Assessment:   1. Ulcer of right foot with fat layer exposed ([place])   2. Type 2 diabetes mellitus with ketoacidosis and coma, with long-term current use of insulin ([place])       Plan:  Patient was evaluated and treated and all questions answered.  Ulcer right transmetatarsal amputation stump wound with fat layer exposed -Debridement as below. -Continue dressed with Betadine wet-to-dry, DSD. -Continue off-loading with cam boot -At this time and clinically not concerning for infection as the wound is not probing down to bone appears to be very superficial without any tunneling.  No correlation with third fourth and fifth metatarsal bone per previous  MRI. -Will try to do blast X.  Working on approval for blast X  Procedure: Excisional Debridement of Wound~stagnant Tool: Sharp chisel blade/tissue nipper Rationale: Removal of non-viable soft tissue from the wound to promote healing.  Anesthesia: none Pre-Debridement Wound Measurements: 1 cm x 0.5 cm x 0.4 cm  Post-Debridement Wound Measurements: 1.2 cm x 0.6 cm x 0.4 cm  Type of Debridement: Sharp Excisional Tissue Removed: Non-viable soft tissue Blood loss: Minimal (<50cc) Depth of Debridement: subcutaneous tissue. Technique: Sharp excisional debridement to bleeding, viable wound base.  Wound Progress: Also about the same patient ran out of wound VAC was unable to change his dressing Site healing conversation 7 Dressing: Dry, sterile,  compression dressing. Disposition: Patient tolerated procedure well. Patient to return in 1 week for follow-up.  No follow-ups on file.   Ulcer about the same ran out of black sponge.  Follow-up with time to go for nursing changes

## 2022-11-08 ENCOUNTER — Encounter (HOSPITAL_BASED_OUTPATIENT_CLINIC_OR_DEPARTMENT_OTHER): Payer: Self-pay | Admitting: Family

## 2022-11-08 ENCOUNTER — Ambulatory Visit (INDEPENDENT_AMBULATORY_CARE_PROVIDER_SITE_OTHER): Payer: 59 | Admitting: Family

## 2022-11-08 VITALS — BP 122/72 | HR 80 | Ht 74.0 in | Wt 232.2 lb

## 2022-11-08 DIAGNOSIS — I10 Essential (primary) hypertension: Secondary | ICD-10-CM | POA: Diagnosis not present

## 2022-11-08 DIAGNOSIS — E785 Hyperlipidemia, unspecified: Secondary | ICD-10-CM

## 2022-11-08 DIAGNOSIS — I25118 Atherosclerotic heart disease of native coronary artery with other forms of angina pectoris: Secondary | ICD-10-CM | POA: Diagnosis not present

## 2022-11-08 MED ORDER — AMLODIPINE BESYLATE 10 MG PO TABS: 10.0000 mg | ORAL_TABLET | Freq: Every day | ORAL | 3 refills | Status: DC

## 2022-11-08 MED ORDER — LISINOPRIL 10 MG PO TABS: 10.0000 mg | ORAL_TABLET | Freq: Every day | ORAL | 3 refills | Status: DC

## 2022-11-08 MED ORDER — ROSUVASTATIN CALCIUM 10 MG PO TABS: 10.0000 mg | ORAL_TABLET | Freq: Every day | ORAL | 3 refills | Status: DC

## 2022-11-08 NOTE — Patient Instructions (Signed)
Medication Instructions:  Continue your current medications.   *If you need a refill on your cardiac medications before your next appointment, please call your pharmacy*   Lab Work/Testing/Procedures: None ordered today.    Follow-Up: At [facility], you and your health needs are our priority.  As part of our continuing mission to provide you with exceptional heart care, we have created designated Provider Care Teams.  These Care Teams include your primary Cardiologist (physician) and Advanced Practice Providers (APPs -  Physician Assistants and Nurse Practitioners) who all work together to provide you with the care you need, when you need it.  We recommend signing up for the patient portal called "MyChart".  Sign up information is provided on this After Visit Summary.  MyChart is used to connect with patients for Virtual Visits (Telemedicine).  Patients are able to view lab/test results, encounter notes, upcoming appointments, etc.  Non-urgent messages can be sent to your provider as well.   To learn more about what you can do with MyChart, go to [email or web address].    Your next appointment:   As scheduled with Dr. Oval Linsey  Other Instructions  Heart Healthy Diet Recommendations: A low-salt diet is recommended. Meats should be grilled, baked, or boiled. Avoid fried foods. Focus on lean protein sources like fish or chicken with vegetables and fruits. The American Heart Association is a Microbiologist!  American Heart Association Diet and Lifeystyle Recommendations   Exercise recommendations: The American Heart Association recommends 150 minutes of moderate intensity exercise weekly. Try 30 minutes of moderate intensity exercise 4-5 times per week. This could include walking, jogging, or swimming.  Important Information About Sugar

## 2022-11-08 NOTE — Progress Notes (Signed)
Office Visit    Patient Name: Eugene Diaz Date of Encounter: 11/08/2022  PCP:  [facility], [place]  Cardiologist:  [name], MD  Advanced Practice Provider:  No care team member to display Electrophysiologist:  None      Chief Complaint    [name] is a 57 y.o. male presents today for hypertension   Past Medical History    Past Medical History:  Diagnosis Date   Alcohol abuse    Alcoholic peripheral neuropathy ([place])    Anxiety    Arthritis    Asthma    followed by pcp   B12 deficiency    CAD in native artery 06/07/2022   Chronic multifocal osteomyelitis of right foot ([place]) 05/23/2022   Depression    Diabetic neuropathy ([place])    Edema of both lower extremities    Essential tremor    neurologist-- dr patel--- due to alcohol abuse   GERD (gastroesophageal reflux disease)    History of cellulitis 2020   left lower leg;   recurrent 03-25-2020   History of esophageal stricture    s/p  dilatation 02-13-2018   History of osteomyelitis 2020   left great toe     Hypercholesterolemia    Hypertension    followed by pcp  (nuclear stress test 03-11-2014 low risk w/ no ischemia, ef 65%   Normocytic anemia    followed by pcp   (03-18-2020 had transfusion's 02/ 2021)   Osteomyelitis of great toe of right foot ([place]) 08/18/2021   Peripheral vascular disease ([place])    Severe depression ([place]) 06/09/2021   Status post incision and drainage followed by Dr [name] and pcp   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   (03-17-2020  pt states most of incision is healed with exception an area that is still draining,  daily dressing change),  foot is red but not warm to the touch and has swelling but has improved)   Suicidal ideation 06/09/2021   Syncope and collapse 06/07/2022   Syncope, near 06/20/2022   Thrombocytopenia ([place])    chronic   Type 2 diabetes mellitus ([place])    followed by pcp---    (03-18-2020  pt stated does not check  blood sugar)   Vaccine counseling 08/29/2022   Past Surgical History:  Procedure Laterality Date   AMPUTATION Left 10/16/2019   Procedure: Left partial second ray resection; placement of antibiotic beads;  Surgeon: Evelina Bucy, DPM;  [facility]: [facility];  Service: Podiatry;  Laterality: Left;   AMPUTATION Left 11/13/2019   Procedure: Left Midfoot Amputation - Transmetatarsal vs. Lisfranc; Placement antibiotic beads;  Surgeon: Evelina Bucy, DPM;  [facility];  Service: Podiatry;  Laterality: Left;   AMPUTATION Left 12/21/2019   Procedure: Chopart Amputation left foot;  Surgeon: Evelina Bucy, DPM;  Location: [place];  Service: Podiatry;  Laterality: Left;   AMPUTATION Left 06/24/2020   Procedure: LEFT BELOW KNEE AMPUTATION;  Surgeon: Newt Minion, MD;  Location: [place];  Service: Orthopedics;  Laterality: Left;   AMPUTATION Right 03/29/2021   Procedure: AMPUTATION RAY;  Surgeon: Evelina Bucy, DPM;  Location: [place];  Service: Podiatry;  Laterality: Right;   AMPUTATION TOE Left 08/14/2019   Procedure: AMPUTATION GREAT TOE;  Surgeon: Evelina Bucy, DPM;  [facility];  Service: Podiatry;  Laterality: Left;   APPLICATION OF WOUND VAC Left 12/21/2019   Procedure: Application Of Wound Vac;  Surgeon: [name],  Christian Mate, DPM;  Location: [place];  Service: Podiatry;  Laterality: Left;   BIOPSY  02/13/2018   Procedure: BIOPSY;  Surgeon: Danie Binder, MD;  [facility];  Service: Endoscopy;;  transverse colon biopsy, gastric biopsy   COLONOSCOPY WITH PROPOFOL N/A 02/13/2018   Procedure: COLONOSCOPY WITH PROPOFOL;  Surgeon: Danie Binder, MD;  [facility];  Service: Endoscopy;  Laterality: N/A;  11:15am   ESOPHAGOGASTRODUODENOSCOPY (EGD) WITH PROPOFOL N/A 02/13/2018   Procedure: ESOPHAGOGASTRODUODENOSCOPY (EGD) WITH PROPOFOL;  Surgeon: Danie Binder, MD;  [facility];  Service: Endoscopy;  Laterality: N/A;   I & D EXTREMITY Left 07/16/2019    Procedure: Insicion  AND DEBRIDEMENT EXTREMITY;  Surgeon: Evelina Bucy, DPM;  Location: [place];  Service: Podiatry;  Laterality: Left;   I & D EXTREMITY Left 06/22/2020   Procedure: IRRIGATION AND DEBRIDEMENT EXTREMITY, left foot and ankle;  Surgeon: Evelina Bucy, DPM;  Location: [place];  Service: Podiatry;  Laterality: Left;   I & D EXTREMITY Right 03/29/2021   Procedure: IRRIGATION AND DEBRIDEMENT EXTREMITY;  Surgeon: Evelina Bucy, DPM;  Location: [place];  Service: Podiatry;  Laterality: Right;   INCISION AND DRAINAGE OF WOUND Left 10/13/2019   Procedure: IRRIGATION AND DEBRIDEMENT WOUND OF LEFT FOOT AND FIRST METATARSAL RESECTION;  Surgeon: Evelina Bucy, DPM;  [facility];  Service: Podiatry;  Laterality: Left;   IRRIGATION AND DEBRIDEMENT FOOT Left 11/11/2019   Procedure: Left Foot Wound Irrigation and Debridement;  Surgeon: Evelina Bucy, DPM;  [facility];  Service: Podiatry;  Laterality: Left;   Left arm     Left arm repair (tendon and artery)   PERCUTANEOUS PINNING  07/16/2019   Procedure: Open Reduction Percutaneous Pinning Extremity;  Surgeon: Evelina Bucy, DPM;  Location: [place];  Service: Podiatry;;   PILONIDAL CYST EXCISION N/A 08/08/2014   Procedure: CYST EXCISION PILONIDAL EXTENSIVE;  Surgeon: Jamesetta So, MD;  [facility];  Service: General;  Laterality: N/A;   POLYPECTOMY  02/13/2018   Procedure: POLYPECTOMY;  Surgeon: Danie Binder, MD;  [facility];  Service: Endoscopy;;  transverse colon polyp hs, rectal polyps times 2   SAVORY DILATION N/A 02/13/2018   Procedure: SAVORY DILATION;  Surgeon: Danie Binder, MD;  [facility];  Service: Endoscopy;  Laterality: N/A;   TENDON LENGTHENING Right 12/04/2021   Procedure: TENDON LENGTHENING;  Surgeon: Landis Martins, DPM;  Location: [place];  Service: Podiatry;  Laterality: Right;   TRANSMETATARSAL AMPUTATION Right 12/04/2021   Procedure: TRANSMETATARSAL AMPUTATION;  Surgeon: Landis Martins, DPM;  Location: [place];  Service: Podiatry;  Laterality: Right;   WOUND DEBRIDEMENT Left 09/04/2019   Procedure: DEBRIDEMENT WOUND WITH COMPLEX  REPAIR OF DEHISCENCE;  Surgeon: Evelina Bucy, DPM;  Location: [place];  Service: Podiatry;  Laterality: Left;  Leave patient on stretcher   WOUND DEBRIDEMENT Left 10/16/2019   Procedure: Left foot wound debridement and closure;  Surgeon: Evelina Bucy, DPM;  [facility];  Service: Podiatry;  Laterality: Left;   WOUND DEBRIDEMENT Left 12/18/2019   Procedure: LEFT FOOT DEBRIDEMENT WITH PARTIAL INCISION OF INFECTED BONE;  Surgeon: Evelina Bucy, DPM;  Location: [place];  Service: Podiatry;  Laterality: Left;  LEFT FOOT DEBRIDEMENT WITH PARTIAL INCISION OF INFECTED BONE   WOUND DEBRIDEMENT Right 04/01/2021   Procedure: Right foot wound debridement and irrigation and closure, bone biopsy;  Surgeon: Evelina Bucy,  DPM;  Location: [place];  Service: Podiatry;  Laterality: Right;    Allergies  No Known Allergies  History of Present Illness    PAUBLO WARSHAWSKY is a 57 y.o. male with a hx of DM2, hypertension, hyperlipidemia, tobacco use, alcohol abuse, PAD s/p left BKA and right transmetatarsal amputation, CAD last seen 09/07/2022.  Coronary CTA 02/2022 mild disease in the LAD, LCx, RCA.  Prior 14-day monitor ordered for near-syncope showed runs of SVT.  Lasting 09/07/2022.  Blood pressure was not at goal.  Lisinopril was resumed at  10 mg daily.  Admitted 12/13-12/15/23 with infected diabetic right foot wound treated with ABX. Right [name] normal. Podiatry recommended non surgical intervention.   He presents today for follow up. Endorses feeling well.  Reports no shortness of breath nor dyspnea on exertion. Reports no chest pain, pressure, or tightness. No edema, orthopnea, PND. Reports no palpitations.  Does have BP cuff at home but has not been checking.   EKGs/Labs/Other Studies Reviewed:   The following studies were  reviewed today:  12 Day Zio Monitor 07/2022:   Quality: Fair.  Baseline artifact. Predominant rhythm: sinus rhythm      Average heart rate: 77 bpm Max heart rate: 127 bpm Min heart rate: 49 bpm Pauses >2.5 seconds: none   6 beats NSVT 10 runs of SVT, up to 9 beats at a time     Echo 03/03/2022: 1. Apex is hypokinetic. Left ventricular ejection fraction, by  estimation, is 50 to 55%. The left ventricle has low normal function.  There is moderate left ventricular hypertrophy. Left ventricular diastolic  parameters are consistent with Grade II  diastolic dysfunction (pseudonormalization).   2. Right ventricular systolic function is normal. The right ventricular  size is normal.   3. The mitral valve was not well visualized. No evidence of mitral valve  regurgitation.   4. There is mild calcification of the aortic valve. Aortic valve  regurgitation is not visualized.   5. The inferior vena cava is normal in size with greater than 50%  respiratory variability, suggesting right atrial pressure of 3 mmHg.     CT Coronary Morph 03/02/2022: FINDINGS: Non-cardiac: See separate report from [facility].   Pulmonary veins drain normally to the left atrium. No LA appendage thrombus.   Calcium Score: 94 Agatston units.   Coronary Arteries: Right dominant with no anomalies   LM: No plaque or stenosis.   LAD system: Mixed plaque ostial LAD, mild (1-24%) stenosis. Large D1 with no significant disease.   Circumflex system: Mixed plaque mid LCx, mild (1-24%) stenosis.   RCA system: Mixed plaque proximal and mid RCA, mild (1-24%) stenosis.   IMPRESSION: 1. Coronary artery calcium score 94 Agatston units. This places the patient in the 74th percentile for age and gender, suggesting intermediate risk for future cardiac events.   2.  Nonobstructive mild CAD.       EKG:  EKG is not ordered today.   Recent Labs: 03/02/2022: TSH 0.570 10/13/2022: Magnesium 1.8 10/19/2022: ALT  9; BUN 10; Creat 0.63; Hemoglobin 16.0; Platelets 151; Potassium 4.2; Sodium 139  Recent Lipid Panel    Component Value Date/Time   CHOL 140 08/31/2022 1052   TRIG 134 08/31/2022 1052   HDL 31 (L) 08/31/2022 1052   CHOLHDL 4.5 08/31/2022 1052   CHOLHDL 4.5 05/24/2022 0534   VLDL 17 05/24/2022 0534   LDLCALC 85 08/31/2022 1052     Home Medications   Current Meds  Medication Sig  acetaminophen (TYLENOL) 325 MG tablet Take 2 tablets (650 mg total) by mouth every 6 (six) hours as needed for mild pain.   albuterol (VENTOLIN HFA) 108 (90 Base) MCG/ACT inhaler Inhale 1-2 puffs into the lungs every 4 (four) hours as needed for wheezing or shortness of breath.   ALPRAZolam (XANAX) 1 MG tablet Take 1 mg by mouth 4 (four) times daily.   amitriptyline (ELAVIL) 100 MG tablet Take 100 mg by mouth at bedtime.   amLODipine (NORVASC) 10 MG tablet Take 1 tablet (10 mg total) by mouth daily.   amoxicillin-clavulanate (AUGMENTIN) 875-125 MG tablet Take 1 tablet by mouth 2 (two) times daily.   diclofenac Sodium (VOLTAREN) 1 % GEL Apply 4 g topically 4 (four) times daily as needed (Apply to painful area over left upper front chest.).   doxycycline (VIBRA-TABS) 100 MG tablet Take 1 tablet (100 mg total) by mouth 2 (two) times daily.   folic acid (FOLVITE) 1 MG tablet Take 1 tablet (1 mg total) by mouth daily.   lisinopril (ZESTRIL) 10 MG tablet Take 1 tablet (10 mg total) by mouth daily.   metFORMIN (GLUCOPHAGE) 500 MG tablet Take 500 mg by mouth 2 (two) times daily.   morphine (MS CONTIN) 30 MG 12 hr tablet Take 30 mg by mouth every 12 (twelve) hours.   Multiple Vitamin (MULTIVITAMIN WITH MINERALS) TABS tablet Take 1 tablet by mouth daily.   oxycodone (ROXICODONE) 30 MG immediate release tablet Take 30 mg by mouth 3 (three) times daily.   rosuvastatin (CRESTOR) 10 MG tablet Take 1 tablet (10 mg total) by mouth daily.   thiamine 100 MG tablet Take 1 tablet (100 mg total) by mouth daily.     Review of  Systems      All other systems reviewed and are otherwise negative except as noted above.  Physical Exam    VS:  BP 122/72   Pulse 80   Ht '6\' 2"'$  (1.88 m)   Wt 232 lb 3.2 oz (105.3 kg)   BMI 29.81 kg/m  , BMI Body mass index is 29.81 kg/m.  Wt Readings from Last 3 Encounters:  11/08/22 232 lb 3.2 oz (105.3 kg)  10/19/22 245 lb (111.1 kg)  10/14/22 229 lb 0.9 oz (103.9 kg)     GEN: Well nourished, well developed, in no acute distress. HEENT: normal. Neck: Supple, no JVD, carotid bruits, or masses. Cardiac: RRR, no murmurs, rubs, or gallops. No clubbing, cyanosis, edema.  Radials/PT 2+ and equal bilaterally.  Respiratory:  Respirations regular and unlabored, clear to auscultation bilaterally. GI: Soft, nontender, nondistended. MS: No deformity or atrophy. Skin: Warm and dry, no rash. Neuro:  Strength and sensation are intact. Psych: Normal affect.  Assessment & Plan    CAD / HLD - Stable with no anginal symptoms. No indication for ischemic evaluation.  04/2022 LDL 70. GDMT Rosuvastatin. Heart healthy diet and regular cardiovascular exercise encouraged.    HTN - BP well controlled. Continue current antihypertensive regimen.    DM2  - Follows with PCP.          Disposition: Follow up  as scheduled  with [name], MD or APP.  Signed, Loel Dubonnet, NP 11/08/2022, 2:55 PM [place]

## 2022-11-21 ENCOUNTER — Ambulatory Visit (INDEPENDENT_AMBULATORY_CARE_PROVIDER_SITE_OTHER): Payer: 59 | Admitting: Infectious Disease

## 2022-11-21 ENCOUNTER — Encounter: Payer: Self-pay | Admitting: Infectious Disease

## 2022-11-21 ENCOUNTER — Other Ambulatory Visit: Payer: Self-pay

## 2022-11-21 VITALS — BP 144/86 | HR 96 | Temp 98.6°F | Wt 229.0 lb

## 2022-11-21 DIAGNOSIS — Z7185 Encounter for immunization safety counseling: Secondary | ICD-10-CM | POA: Diagnosis not present

## 2022-11-21 DIAGNOSIS — M86172 Other acute osteomyelitis, left ankle and foot: Secondary | ICD-10-CM

## 2022-11-21 DIAGNOSIS — G621 Alcoholic polyneuropathy: Secondary | ICD-10-CM | POA: Diagnosis not present

## 2022-11-21 MED ORDER — DOXYCYCLINE HYCLATE 100 MG PO TABS: 100.0000 mg | ORAL_TABLET | Freq: Two times a day (BID) | ORAL | 5 refills | Status: DC

## 2022-11-21 MED ORDER — AMOXICILLIN-POT CLAVULANATE 875-125 MG PO TABS: 1.0000 | ORAL_TABLET | Freq: Two times a day (BID) | ORAL | 5 refills | Status: DC

## 2022-11-21 NOTE — Progress Notes (Signed)
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   MRN: [id number]  HPI  Eugene Diaz is a 57 y.o. male alcoholic neuropathy diabetes mellitus and osteomyelitis who is well-known to me from my having seen him as an inpatient and followed him in our clinic.  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  He had additional further debridement as well with bone biopsy of the first metatarsal and wound closure.  He grew E. Coli group B streptococcus as well as beta-lactamase producing Prevotella.  He was narrowed to intravenous cefazolin with oral metronidazole and discharged home with this with his sister helping him with antibiotics.  We switched him over the IV therapy to Augmentin and he was followed by [name] by podiatry.  He had some significant problems with depression and passive suicidal ideation and related to the loss of his wife many years ago.  He improved significantly emotionally and actually had reduced his alcohol intake drastically.  He is not completely clean but his intake is dramatically less than in the past when he drank on a daily basis.  Followed him closely and saw him last in November 2022.   He came off his antibiotics and he came back and was seen by my partner [name] and seem to have no evidence of active infection at that point in time.   Unfortunately interim he developed worsening gangrenous changes involving the third toe and underwent transmetatarsal amputation with [name] on December 04, 2021.  Apparently approxi a month ago his foot got caught on a nail that was sticking out of his porch.  He  developed intermittent redness and swelling that worsened recently and was convinced by family come to the emergency department with worsening flexion of his feet.  His foot was warm and swollen and erythematous.  MRI was performed at [facility] which showed a new soft tissue ulcer in the medial forefoot distal to the coronal and medial to the first metatarsal amputation stump.  There is edema and swelling of the ankle and dorsal midfoot and there is marrow edema within the third and fourth and remaining fifth metatarsal shafts as well as some mild edema in the distal aspect the remaining first and second metatarsal shafts all concerning for osteomyelitis.   Patient was seen by general surgery at [facility] he did not want to intervene surgically.  They wanted to try antibiotics.  I was very skeptical that antibiotics were going to get [place] very far but I i ertapenem with doxycycline June 30, 2022.  In the interim he was hospitalized with an episode of unresponsiveness that was thought to be due to too many sedating medications from home though he is seen [name] with cardiology who seems to think the patient had a near syncopal event rather than a drug related overdose.  Patient has had a monitor on since then.  He is also seen Dr. [name] for his foot but it seems that Dr. [name] is not aware (based on his notes prior to my last visiit  the patient's recent hospital admission or MRI.  He has completed IV antibiotics and  was on oral doxycycline and cefdinir.  His wound  continued to improve and he is continue to follow with Dr. [name].  Dr [name] did not feel that the MRI findings correlated with exam and that he was skeptical of osteomyelitis.   Had seen [name] in September I thought we were stopping antibiotics but turns out his pharmacy was still sending cefdinir and doxycycline at last visit in November I again thought we had conclusively stopped both antibiotics but I again see that they  are both being filled [name] is not sure whether he is actually taking them or getting them but certainly the dispense history shows that [name] is feeling them every month.  In the interim was hospitalized in December 13 with foot swelling increased pain erythema and warmth and worsening serous drainage from his foot wounds.  He had presented to the podiatry clinic and after they examined him they directed him to come to the emergency department for management of infection and initiation of IV antibiotics.   Was started vancomycin cefepime and Flagyl in the ER.   MRI of the right foot was performed which showed onyx soft tissue and ulceration with mild marrow T2 hypertensive in the metatarsal shafts 3-5th  that had improved in the right forefoot and no T1 abnormality or progressive cortical destruction seen with improvement in subcutaneous edema on dorsum of the foot.   Dietary saw him and felt no indication for surgical intervention and that this was "only cellulitis.  Patient was sent home on doxycycline.  He continues to have drainage from the 2 ulcers and the wounds appear worse to me than when I last examined him.  Billycould really remember much about why he was hospitalized and says his memory is getting worse.  Has developed an ulceration on his left ankle where of the foot brace seems to be rubbing up against the skin.  We changed him over to Augmentin with doxycycline in place of the cefdinir since he had had cellulitis in the face of cefdinir and doxycycline.      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[place]) 05/23/2022   Depression    Diabetic neuropathy ([place])    Edema of both lower extremities    Essential tremor    neurologist-- dr patel--- due to alcohol  abuse   GERD (gastroesophageal reflux disease)    History of cellulitis 2020   left lower leg;   recurrent 03-25-2020   History of esophageal stricture    s/p  dilatation 02-13-2018   History of osteomyelitis 2020   left great toe     Hypercholesterolemia    Hypertension    followed by pcp  (nuclear stress test 03-11-2014 low risk w/ no ischemia, ef 65%   Normocytic anemia    followed by pcp   (03-18-2020 had transfusion's 02/ 2021)   Osteomyelitis of great toe of right foot ([place]) 08/18/2021   Peripheral vascular disease ([place])    Severe depression ([place]) 06/09/2021   Status post incision and drainage followed by Dr [name] and pcp   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   (03-17-2020  pt states most of incision is healed with exception an area that is still draining,  daily dressing change),  foot is red but not warm to the touch and has swelling but has improved)   Suicidal ideation 06/09/2021   Syncope and collapse 06/07/2022   Syncope, near 06/20/2022   Thrombocytopenia ([place])    chronic   Type 2 diabetes mellitus ([place])    followed by pcp---    (03-18-2020  pt stated does not check blood sugar)   Vaccine counseling 08/29/2022    Past Surgical History:  Procedure Laterality Date   AMPUTATION Left 10/16/2019   Procedure: Left partial second ray resection; placement of antibiotic beads;  Surgeon: Evelina Bucy, DPM;  Location: [facility];  Service: Podiatry;  Laterality: Left;   AMPUTATION Left 11/13/2019   Procedure: Left Midfoot Amputation - Transmetatarsal vs. Lisfranc; Placement antibiotic beads;  Surgeon: Evelina Bucy, DPM;  Location: [facility];  Service: Podiatry;  Laterality: Left;   AMPUTATION Left 12/21/2019   Procedure: Chopart Amputation left foot;  Surgeon: Evelina Bucy, DPM;  Location: [place];  Service: Podiatry;  Laterality: Left;   AMPUTATION Left 06/24/2020   Procedure: LEFT BELOW KNEE AMPUTATION;  Surgeon: Newt Minion, MD;  Location: [place];  Service: Orthopedics;   Laterality: Left;   AMPUTATION Right 03/29/2021   Procedure: AMPUTATION RAY;  Surgeon: Evelina Bucy, DPM;  Location: [place];  Service: Podiatry;  Laterality: Right;   AMPUTATION TOE Left 08/14/2019   Procedure: AMPUTATION GREAT TOE;  Surgeon: Evelina Bucy, DPM;  [facility];  Service: Podiatry;  Laterality: Left;   APPLICATION OF WOUND VAC Left 12/21/2019   Procedure: Application Of Wound Vac;  Surgeon: Evelina Bucy, DPM;  Location: [employer];  Service: Podiatry;  Laterality: Left;   BIOPSY  02/13/2018   Procedure: BIOPSY;  Surgeon: Danie Binder, MD;  [facility];  Service: Endoscopy;;  transverse colon biopsy, gastric biopsy   COLONOSCOPY WITH PROPOFOL N/A 02/13/2018   Procedure: COLONOSCOPY WITH PROPOFOL;  Surgeon: Danie Binder, MD;  [facility];  Service: Endoscopy;  Laterality: N/A;  11:15am   ESOPHAGOGASTRODUODENOSCOPY (EGD) WITH PROPOFOL N/A 02/13/2018   Procedure: ESOPHAGOGASTRODUODENOSCOPY (EGD) WITH PROPOFOL;  Surgeon: Danie Binder, MD;  [facility];  Service: Endoscopy;  Laterality: N/A;   I & D EXTREMITY Left 07/16/2019   Procedure: Insicion  AND DEBRIDEMENT EXTREMITY;  Surgeon: Evelina Bucy, DPM;  Location: [place];  Service: Podiatry;  Laterality: Left;   I & D EXTREMITY Left 06/22/2020   Procedure: IRRIGATION AND DEBRIDEMENT EXTREMITY, left foot and ankle;  Surgeon: Evelina Bucy, DPM;  Location: [facility] [place];  Service: Podiatry;  Laterality: Left;   I & D EXTREMITY Right 03/29/2021   Procedure: IRRIGATION AND DEBRIDEMENT EXTREMITY;  Surgeon: Evelina Bucy, DPM;  Location: [place];  Service: Podiatry;  Laterality: Right;   INCISION AND DRAINAGE OF WOUND Left 10/13/2019   Procedure: IRRIGATION AND DEBRIDEMENT WOUND OF LEFT FOOT AND FIRST METATARSAL RESECTION;  Surgeon: Evelina Bucy, DPM;  [facility]: [facility];  Service: Podiatry;  Laterality: Left;   IRRIGATION AND DEBRIDEMENT FOOT Left 11/11/2019   Procedure: Left Foot Wound  Irrigation and Debridement;  Surgeon: Evelina Bucy, DPM;  [facility]: [facility];  Service: Podiatry;  Laterality: Left;   Left arm     Left arm repair (tendon and artery)   PERCUTANEOUS PINNING  07/16/2019   Procedure: Open Reduction Percutaneous Pinning Extremity;  Surgeon: Evelina Bucy, DPM;  Location: [place];  Service: Podiatry;;   PILONIDAL  CYST EXCISION N/A 08/08/2014   Procedure: CYST EXCISION PILONIDAL EXTENSIVE;  Surgeon: Jamesetta So, MD;  [facility];  Service: General;  Laterality: N/A;   POLYPECTOMY  02/13/2018   Procedure: POLYPECTOMY;  Surgeon: Danie Binder, MD;  [facility];  Service: Endoscopy;;  transverse colon polyp hs, rectal polyps times 2   SAVORY DILATION N/A 02/13/2018   Procedure: SAVORY DILATION;  Surgeon: Danie Binder, MD;  [facility];  Service: Endoscopy;  Laterality: N/A;   TENDON LENGTHENING Right 12/04/2021   Procedure: TENDON LENGTHENING;  Surgeon: Landis Martins, DPM;  Location: [place];  Service: Podiatry;  Laterality: Right;   TRANSMETATARSAL AMPUTATION Right 12/04/2021   Procedure: TRANSMETATARSAL AMPUTATION;  Surgeon: Landis Martins, DPM;  Location: [place];  Service: Podiatry;  Laterality: Right;   WOUND DEBRIDEMENT Left 09/04/2019   Procedure: DEBRIDEMENT WOUND WITH COMPLEX  REPAIR OF DEHISCENCE;  Surgeon: Evelina Bucy, DPM;  Location: [place];  Service: Podiatry;  Laterality: Left;  Leave patient on stretcher   WOUND DEBRIDEMENT Left 10/16/2019   Procedure: Left foot wound debridement and closure;  Surgeon: Evelina Bucy, DPM;  Location[facility];  Service: Podiatry;  Laterality: Left;   WOUND DEBRIDEMENT Left 12/18/2019   Procedure: LEFT FOOT DEBRIDEMENT WITH PARTIAL INCISION OF INFECTED BONE;  Surgeon: Evelina Bucy, DPM;  Location: [place];  Service: Podiatry;  Laterality: Left;  LEFT FOOT DEBRIDEMENT WITH PARTIAL INCISION OF INFECTED BONE   WOUND DEBRIDEMENT Right 04/01/2021   Procedure: Right foot  wound debridement and irrigation and closure, bone biopsy;  Surgeon: Evelina Bucy, DPM;  Location: [place];  Service: Podiatry;  Laterality: Right;    Family History  Problem Relation Age of Onset   Heart attack Mother        Living, 20   Cancer Father        Deceased   Healthy Brother    Healthy Sister    Colon cancer Neg Hx    Colon polyps Neg Hx       Social History   Socioeconomic History   Marital status: Widowed    Spouse name: Not on file   Number of children: 0   Years of education: Not on file   Highest education level: 8th grade  Occupational History   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  Tobacco Use   Smoking status: Some Days    Packs/day: 2.50    Years: 25.00    Total pack years: 62.50    Types: Cigarettes, Cigars    Last attempt to quit: 08/05/1984    Years since quitting: 38.3   Smokeless tobacco: Never   Tobacco comments:    Quit smoking cigarettes; now smokes cigars, wants to quit  Vaping Use   Vaping Use: Never used  Substance and Sexual Activity   Alcohol use: Yes    Alcohol/week: 50.0 standard drinks of alcohol    Types: 50 Standard drinks or equivalent per week    Comment: Drinks 6 beers, liquor 4-6oz (both daily) x 35 years; now states he "drinks a few beers and let it go"   Drug use: Not Currently    Types: Marijuana   Sexual activity: Yes    Birth control/protection: None  Other Topics Concern   Not on file  Social History Narrative   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 Highest level of education:  8th grade  He lives with wife.  No children.   Left handed   Drinks soda, water-no coffee, no tea   Social Determinants of Health   Financial Resource Strain: Not on file  Food Insecurity: No Food Insecurity (10/13/2022)   Hunger Vital Sign    Worried About Running Out of Food in the Last Year: Never true    Ran Out of Food in the Last Year: Never true   Transportation Needs: No Transportation Needs (10/13/2022)   PRAPARE - Hydrologist (Medical): No    Lack of Transportation (Non-Medical): No  Physical Activity: Not on file  Stress: Not on file  Social Connections: Not on file    No Known Allergies   Current Outpatient Medications:    acetaminophen (TYLENOL) 325 MG tablet, Take 2 tablets (650 mg total) by mouth every 6 (six) hours as needed for mild pain., Disp: , Rfl:    albuterol (VENTOLIN HFA) 108 (90 Base) MCG/ACT inhaler, Inhale 1-2 puffs into the lungs every 4 (four) hours as needed for wheezing or shortness of breath., Disp: , Rfl:    ALPRAZolam (XANAX) 1 MG tablet, Take 1 mg by mouth 4 (four) times daily., Disp: , Rfl:    amitriptyline (ELAVIL) 100 MG tablet, Take 100 mg by mouth at bedtime., Disp: , Rfl:    amLODipine (NORVASC) 10 MG tablet, Take 1 tablet (10 mg total) by mouth daily., Disp: 90 tablet, Rfl: 3   amoxicillin-clavulanate (AUGMENTIN) 875-125 MG tablet, Take 1 tablet by mouth 2 (two) times daily., Disp: 60 tablet, Rfl: 5   diclofenac Sodium (VOLTAREN) 1 % GEL, Apply 4 g topically 4 (four) times daily as needed (Apply to painful area over left upper front chest.)., Disp: 50 g, Rfl: 0   doxycycline (VIBRA-TABS) 100 MG tablet, Take 1 tablet (100 mg total) by mouth 2 (two) times daily., Disp: 60 tablet, Rfl: 5   folic acid (FOLVITE) 1 MG tablet, Take 1 tablet (1 mg total) by mouth daily., Disp: 30 tablet, Rfl: 0   lisinopril (ZESTRIL) 10 MG tablet, Take 1 tablet (10 mg total) by mouth daily., Disp: 90 tablet, Rfl: 3   metFORMIN (GLUCOPHAGE) 500 MG tablet, Take 500 mg by mouth 2 (two) times daily., Disp: , Rfl:    morphine (MS CONTIN) 30 MG 12 hr tablet, Take 30 mg by mouth every 12 (twelve) hours., Disp: , Rfl:    Multiple Vitamin (MULTIVITAMIN WITH MINERALS) TABS tablet, Take 1 tablet by mouth daily., Disp: , Rfl:    oxycodone (ROXICODONE) 30 MG immediate release tablet, Take 30 mg by mouth 3  (three) times daily., Disp: , Rfl:    rosuvastatin (CRESTOR) 10 MG tablet, Take 1 tablet (10 mg total) by mouth daily., Disp: 90 tablet, Rfl: 3   thiamine 100 MG tablet, Take 1 tablet (100 mg total) by mouth daily., Disp: 30 tablet, Rfl: 0   Review of Systems  Unable to perform ROS: Other  Constitutional:  Negative for activity change, appetite change, chills, diaphoresis, fatigue, fever and unexpected weight change.  HENT:  Negative for congestion, rhinorrhea, sinus pressure, sneezing, sore throat and trouble swallowing.   Eyes:  Negative for photophobia and visual disturbance.  Respiratory:  Negative for cough, chest tightness, shortness of breath, wheezing and stridor.   Cardiovascular:  Negative for chest pain, palpitations and leg swelling.  Gastrointestinal:  Negative for abdominal distention, abdominal pain, anal bleeding, blood in stool, constipation, diarrhea, nausea and vomiting.  Genitourinary:  Negative for difficulty  urinating, dysuria, flank pain and hematuria.  Musculoskeletal:  Negative for arthralgias, back pain, gait problem, joint swelling and myalgias.  Skin:  Positive for wound. Negative for color change, pallor and rash.  Neurological:  Negative for dizziness, tremors, weakness and light-headedness.  Hematological:  Negative for adenopathy. Does not bruise/bleed easily.  Psychiatric/Behavioral:  Negative for agitation, behavioral problems, confusion, decreased concentration, dysphoric mood and sleep disturbance.        Objective:   Physical Exam Constitutional:      Appearance: He is well-developed.  HENT:     Head: Normocephalic and atraumatic.  Eyes:     Conjunctiva/sclera: Conjunctivae normal.  Cardiovascular:     Rate and Rhythm: Normal rate and regular rhythm.  Pulmonary:     Effort: Pulmonary effort is normal. No respiratory distress.     Breath sounds: No wheezing.  Abdominal:     General: There is no distension.     Palpations: Abdomen is soft.   Musculoskeletal:        General: No tenderness. Normal range of motion.     Cervical back: Normal range of motion and neck supple.  Skin:    General: Skin is warm and dry.     Coloration: Skin is not pale.     Findings: No erythema or rash.  Neurological:     General: No focal deficit present.     Mental Status: He is alert and oriented to person, place, and time.  Psychiatric:        Mood and Affect: Mood normal.        Behavior: Behavior normal.        Thought Content: Thought content normal.        Judgment: Judgment normal.     Right lower extremity 06/20/2022:    Right foot 07/20/2022:    Foot 08/29/2022:     10/19/2022:    Right foot 11/23/2022:          New ulcer right ankle:    Assessment & Plan:  Osteomyelitis involving the right foot   Despite the fact the MOST recent  MRI looks better I still am very concerned that he has infection in the bones in his feet and that that is the reason why he is having these recurrent episodes of "cellulitis.  It is quite disconcerting that he would be admitted to the hospital with progressive infection even if it is the soft tissue in the face of cefdinir and doxycycline if he is actually getting those antibiotics.  Recheck inflammatory markers today and I will continue him on Augmentin and doxycycline and see him back in several months time.  He should also be following up podiatry clearly.    Vaccine counseling: recommended flu and COVID 19 vaccines but he declined.

## 2022-11-22 LAB — CBC WITH DIFFERENTIAL/PLATELET
Absolute Monocytes: 308 cells/uL (ref 200–950)
Basophils Absolute: 79 cells/uL (ref 0–200)
Basophils Relative: 0.9 %
Eosinophils Absolute: 449 cells/uL (ref 15–500)
Eosinophils Relative: 5.1 %
HCT: 51.5 % — ABNORMAL HIGH (ref 38.5–50.0)
Hemoglobin: 17.9 g/dL — ABNORMAL HIGH (ref 13.2–17.1)
Lymphs Abs: 2543 cells/uL (ref 850–3900)
MCH: 32.5 pg (ref 27.0–33.0)
MCHC: 34.8 g/dL (ref 32.0–36.0)
MCV: 93.5 fL (ref 80.0–100.0)
MPV: 10.3 fL (ref 7.5–12.5)
Monocytes Relative: 3.5 %
Neutro Abs: 5421 cells/uL (ref 1500–7800)
Neutrophils Relative %: 61.6 %
Platelets: 225 10*3/uL (ref 140–400)
RBC: 5.51 10*6/uL (ref 4.20–5.80)
RDW: 13.2 % (ref 11.0–15.0)
Total Lymphocyte: 28.9 %
WBC: 8.8 10*3/uL (ref 3.8–10.8)

## 2022-11-22 LAB — C-REACTIVE PROTEIN: CRP: 22.1 mg/L — ABNORMAL HIGH (ref <8.0)

## 2022-11-22 LAB — BASIC METABOLIC PANEL WITH GFR
BUN: 8 mg/dL (ref 7–25)
CO2: 23 mmol/L (ref 20–32)
Calcium: 9.2 mg/dL (ref 8.6–10.3)
Chloride: 106 mmol/L (ref 98–110)
Creat: 0.8 mg/dL (ref 0.70–1.30)
Glucose, Bld: 165 mg/dL — ABNORMAL HIGH (ref 65–99)
Potassium: 4.1 mmol/L (ref 3.5–5.3)
Sodium: 139 mmol/L (ref 135–146)
eGFR: 103 mL/min/{1.73_m2} (ref >=60)

## 2022-11-22 LAB — SEDIMENTATION RATE: Sed Rate: 6 mm/h (ref 0–20)

## 2022-11-30 ENCOUNTER — Ambulatory Visit (INDEPENDENT_AMBULATORY_CARE_PROVIDER_SITE_OTHER): Payer: 59 | Admitting: Podiatry

## 2022-11-30 DIAGNOSIS — L97512 Non-pressure chronic ulcer of other part of right foot with fat layer exposed: Secondary | ICD-10-CM

## 2022-11-30 DIAGNOSIS — E1111 Type 2 diabetes mellitus with ketoacidosis with coma: Secondary | ICD-10-CM

## 2022-11-30 DIAGNOSIS — Z794 Long term (current) use of insulin: Secondary | ICD-10-CM | POA: Diagnosis not present

## 2022-11-30 NOTE — Progress Notes (Signed)
Subjective:  Patient ID: Eugene Diaz, male    DOB: 1966-10-03,  MRN: [id number]  Chief Complaint  Patient presents with   Foot Ulcer    Right foot ulcer, some drainage and pain, TX: cam boot, Augmentin, Doxycyline     57 y.o. male presents for wound care.  Patient presents with follow-up to right transmetatarsal amputation stump.  He states he ran out of VAC sponge so he went back to regular dressings.  Denies any other acute complaints   Review of Systems: Negative except as noted in the HPI. Denies N/V/F/Ch.  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[place]) 05/23/2022   Depression    Diabetic neuropathy ([place])    Edema of both lower extremities    Essential tremor    neurologist-- dr Elmer Merwin--- due to alcohol abuse   GERD (gastroesophageal reflux disease)    History of cellulitis 2020   left lower leg;   recurrent 03-25-2020   History of esophageal stricture    s/p  dilatation 02-13-2018   History of osteomyelitis 2020   left great toe     Hypercholesterolemia    Hypertension    followed by pcp  (nuclear stress test 03-11-2014 low risk w/ no ischemia, ef 65%   Normocytic anemia    followed by pcp   (03-18-2020 had transfusion's 02/ 2021)   Osteomyelitis of great toe of right foot ([place]) 08/18/2021   Peripheral vascular disease ([place])    Severe depression ([place]) 06/09/2021   Status post incision and drainage followed by Dr [name] and pcp   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   (03-17-2020  pt states most of incision is healed with exception an area that is still draining,  daily dressing change),  foot is red but not warm to the touch and has swelling but has improved)   Suicidal ideation 06/09/2021   Syncope and collapse 06/07/2022   Syncope, near 06/20/2022    Thrombocytopenia ([place])    chronic   Type 2 diabetes mellitus ([place])    followed by pcp---    (03-18-2020  pt stated does not check blood sugar)   Vaccine counseling 08/29/2022    Current Outpatient Medications:    acetaminophen (TYLENOL) 325 MG tablet, Take 2 tablets (650 mg total) by mouth every 6 (six) hours as needed for mild pain., Disp: , Rfl:    albuterol (VENTOLIN HFA) 108 (90 Base) MCG/ACT inhaler, Inhale 1-2 puffs into the lungs every 4 (four) hours as needed for wheezing or shortness of breath., Disp: , Rfl:    ALPRAZolam (XANAX) 1 MG tablet, Take 1 mg by mouth 4 (four) times daily., Disp: , Rfl:    amitriptyline (ELAVIL) 100 MG tablet, Take 100 mg by mouth at bedtime., Disp: , Rfl:    amLODipine (NORVASC) 10 MG tablet, Take 1 tablet (10 mg total) by mouth daily., Disp: 90 tablet, Rfl: 3   amoxicillin-clavulanate (AUGMENTIN) 875-125 MG tablet, Take 1 tablet by mouth 2 (two) times daily., Disp: 60 tablet, Rfl: 5   diclofenac Sodium (VOLTAREN) 1 % GEL, Apply 4 g topically 4 (four) times daily as needed (Apply to painful area over left upper front chest.)., Disp: 50 g, Rfl: 0   doxycycline (  VIBRA-TABS) 100 MG tablet, Take 1 tablet (100 mg total) by mouth 2 (two) times daily., Disp: 60 tablet, Rfl: 5   folic acid (FOLVITE) 1 MG tablet, Take 1 tablet (1 mg total) by mouth daily., Disp: 30 tablet, Rfl: 0   lisinopril (ZESTRIL) 10 MG tablet, Take 1 tablet (10 mg total) by mouth daily., Disp: 90 tablet, Rfl: 3   metFORMIN (GLUCOPHAGE) 500 MG tablet, Take 500 mg by mouth 2 (two) times daily., Disp: , Rfl:    morphine (MS CONTIN) 30 MG 12 hr tablet, Take 30 mg by mouth every 12 (twelve) hours., Disp: , Rfl:    Multiple Vitamin (MULTIVITAMIN WITH MINERALS) TABS tablet, Take 1 tablet by mouth daily., Disp: , Rfl:    oxycodone (ROXICODONE) 30 MG immediate release tablet, Take 30 mg by mouth 3 (three) times daily., Disp: , Rfl:    rosuvastatin (CRESTOR) 10 MG tablet, Take 1 tablet (10 mg total) by  mouth daily., Disp: 90 tablet, Rfl: 3   thiamine 100 MG tablet, Take 1 tablet (100 mg total) by mouth daily., Disp: 30 tablet, Rfl: 0  Social History   Tobacco Use  Smoking Status Some Days   Packs/day: 2.50   Years: 25.00   Total pack years: 62.50   Types: Cigarettes, Cigars   Last attempt to quit: 08/05/1984   Years since quitting: 38.3  Smokeless Tobacco Never  Tobacco Comments   Quit smoking cigarettes; now smokes cigars, wants to quit    No Known Allergies Objective:   There were no vitals filed for this visit.  There is no height or weight on file to calculate BMI. Constitutional Well developed. Well nourished.  Vascular Dorsalis pedis pulses palpable bilaterally. Posterior tibial pulses palpable bilaterally. Capillary refill normal to all digits.  No cyanosis or clubbing noted. Pedal hair growth normal.  Neurologic Normal speech. Oriented to person, place, and time. Protective sensation absent  Dermatologic Wound Location: Right transmetatarsal amputation site stump wound with fat layer exposed.  No purulent drainage noted does not probe down to bone. Wound Base: Mixed Granular/Fibrotic Peri-wound: Calloused Exudate: Scant/small amount Serosanguinous exudate Wound Measurements: -See above  Orthopedic: No pain to palpation either foot.   Radiographs: None Assessment:   1. Ulcer of right foot with fat layer exposed ([place])   2. Type 2 diabetes mellitus with ketoacidosis and coma, with long-term current use of insulin ([place])        Plan:  Patient was evaluated and treated and all questions answered.  Ulcer right transmetatarsal amputation stump wound with fat layer exposed -Debridement as below. -Continue dressed with Betadine wet-to-dry, DSD. -Continue off-loading with cam boot -At this time and clinically not concerning for infection as the wound is not probing down to bone appears to be very superficial without any tunneling.  No correlation with third  fourth and fifth metatarsal bone per previous MRI. -Continue blast X treatment. -I will discuss referral to the wound care center during next time  Procedure: Excisional Debridement of Wound~stagnant Tool: Sharp chisel blade/tissue nipper Rationale: Removal of non-viable soft tissue from the wound to promote healing.  Anesthesia: none Pre-Debridement Wound Measurements: 1 cm x 0.5 cm x 0.4 cm  Post-Debridement Wound Measurements: 1.2 cm x 0.6 cm x 0.4 cm  Type of Debridement: Sharp Excisional Tissue Removed: Non-viable soft tissue Blood loss: Minimal (<50cc) Depth of Debridement: subcutaneous tissue. Technique: Sharp excisional debridement to bleeding, viable wound base.  Wound Progress: Also about the same patient ran out of wound VAC  was unable to change his dressing Site healing conversation 7 Dressing: Dry, sterile, compression dressing. Disposition: Patient tolerated procedure well. Patient to return in 1 week for follow-up.  No follow-ups on file.

## 2022-12-08 DIAGNOSIS — I10 Essential (primary) hypertension: Secondary | ICD-10-CM | POA: Diagnosis not present

## 2022-12-08 DIAGNOSIS — E1165 Type 2 diabetes mellitus with hyperglycemia: Secondary | ICD-10-CM | POA: Diagnosis not present

## 2022-12-08 DIAGNOSIS — M86371 Chronic multifocal osteomyelitis, right ankle and foot: Secondary | ICD-10-CM | POA: Diagnosis not present

## 2022-12-08 DIAGNOSIS — I96 Gangrene, not elsewhere classified: Secondary | ICD-10-CM | POA: Diagnosis not present

## 2022-12-08 DIAGNOSIS — G546 Phantom limb syndrome with pain: Secondary | ICD-10-CM | POA: Diagnosis not present

## 2022-12-08 DIAGNOSIS — Z89421 Acquired absence of other right toe(s): Secondary | ICD-10-CM | POA: Diagnosis not present

## 2022-12-08 DIAGNOSIS — M150 Primary generalized (osteo)arthritis: Secondary | ICD-10-CM | POA: Diagnosis not present

## 2022-12-08 DIAGNOSIS — Z89512 Acquired absence of left leg below knee: Secondary | ICD-10-CM | POA: Diagnosis not present

## 2022-12-08 DIAGNOSIS — G894 Chronic pain syndrome: Secondary | ICD-10-CM | POA: Diagnosis not present

## 2022-12-28 ENCOUNTER — Telehealth: Payer: Self-pay | Admitting: *Deleted

## 2022-12-28 ENCOUNTER — Ambulatory Visit (INDEPENDENT_AMBULATORY_CARE_PROVIDER_SITE_OTHER): Payer: Medicare HMO | Admitting: Podiatry

## 2022-12-28 DIAGNOSIS — L97512 Non-pressure chronic ulcer of other part of right foot with fat layer exposed: Secondary | ICD-10-CM

## 2022-12-28 DIAGNOSIS — E1111 Type 2 diabetes mellitus with ketoacidosis with coma: Secondary | ICD-10-CM

## 2022-12-28 DIAGNOSIS — Z794 Long term (current) use of insulin: Secondary | ICD-10-CM

## 2022-12-28 NOTE — Progress Notes (Signed)
Subjective:  Patient ID: Eugene Diaz, male    DOB: April 23, 1966,  MRN: [id number]  Chief Complaint  Patient presents with   Foot Ulcer    57 y.o. male presents for wound care.  Patient presents with follow-up to right transmetatarsal amputation stump.  He states is doing okay.  Denies any other acute complaints wound is about the same.  He would like to go to the wound care center   Review of Systems: Negative except as noted in the HPI. Denies N/V/F/Ch.  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[place]) 05/23/2022   Depression    Diabetic neuropathy ([place])    Edema of both lower extremities    Essential tremor    neurologist-- dr Zenaida Tesar--- due to alcohol abuse   GERD (gastroesophageal reflux disease)    History of cellulitis 2020   left lower leg;   recurrent 03-25-2020   History of esophageal stricture    s/p  dilatation 02-13-2018   History of osteomyelitis 2020   left great toe     Hypercholesterolemia    Hypertension    followed by pcp  (nuclear stress test 03-11-2014 low risk w/ no ischemia, ef 65%   Normocytic anemia    followed by pcp   (03-18-2020 had transfusion's 02/ 2021)   Osteomyelitis of great toe of right foot ([place]) 08/18/2021   Peripheral vascular disease ([place])    Severe depression ([place]) 06/09/2021   Status post incision and drainage followed by Dr [name] and pcp   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   (03-17-2020  pt states most of incision is healed with exception an area that is still draining,  daily dressing change),  foot is red but not warm to the touch and has swelling but has improved)   Suicidal ideation 06/09/2021   Syncope and collapse 06/07/2022   Syncope, near 06/20/2022   Thrombocytopenia ([place])    chronic   Type 2 diabetes mellitus  ([place])    followed by pcp---    (03-18-2020  pt stated does not check blood sugar)   Vaccine counseling 08/29/2022    Current Outpatient Medications:    acetaminophen (TYLENOL) 325 MG tablet, Take 2 tablets (650 mg total) by mouth every 6 (six) hours as needed for mild pain., Disp: , Rfl:    albuterol (VENTOLIN HFA) 108 (90 Base) MCG/ACT inhaler, Inhale 1-2 puffs into the lungs every 4 (four) hours as needed for wheezing or shortness of breath., Disp: , Rfl:    ALPRAZolam (XANAX) 1 MG tablet, Take 1 mg by mouth 4 (four) times daily., Disp: , Rfl:    amitriptyline (ELAVIL) 100 MG tablet, Take 100 mg by mouth at bedtime., Disp: , Rfl:    amLODipine (NORVASC) 10 MG tablet, Take 1 tablet (10 mg total) by mouth daily., Disp: 90 tablet, Rfl: 3   amoxicillin-clavulanate (AUGMENTIN) 875-125 MG tablet, Take 1 tablet by mouth 2 (two) times daily., Disp: 60 tablet, Rfl: 5   diclofenac Sodium (VOLTAREN) 1 % GEL, Apply 4 g topically 4 (four) times daily as needed (Apply to painful area over left upper front chest.)., Disp: 50 g, Rfl: 0   doxycycline (VIBRA-TABS) 100 MG tablet, Take 1 tablet (100 mg total)  by mouth 2 (two) times daily., Disp: 60 tablet, Rfl: 5   folic acid (FOLVITE) 1 MG tablet, Take 1 tablet (1 mg total) by mouth daily., Disp: 30 tablet, Rfl: 0   lisinopril (ZESTRIL) 10 MG tablet, Take 1 tablet (10 mg total) by mouth daily., Disp: 90 tablet, Rfl: 3   metFORMIN (GLUCOPHAGE) 500 MG tablet, Take 500 mg by mouth 2 (two) times daily., Disp: , Rfl:    morphine (MS CONTIN) 30 MG 12 hr tablet, Take 30 mg by mouth every 12 (twelve) hours., Disp: , Rfl:    Multiple Vitamin (MULTIVITAMIN WITH MINERALS) TABS tablet, Take 1 tablet by mouth daily., Disp: , Rfl:    oxycodone (ROXICODONE) 30 MG immediate release tablet, Take 30 mg by mouth 3 (three) times daily., Disp: , Rfl:    rosuvastatin (CRESTOR) 10 MG tablet, Take 1 tablet (10 mg total) by mouth daily., Disp: 90 tablet, Rfl: 3   thiamine 100 MG tablet,  Take 1 tablet (100 mg total) by mouth daily., Disp: 30 tablet, Rfl: 0  Social History   Tobacco Use  Smoking Status Some Days   Packs/day: 2.50   Years: 25.00   Total pack years: 62.50   Types: Cigarettes, Cigars   Last attempt to quit: 08/05/1984   Years since quitting: 38.4  Smokeless Tobacco Never  Tobacco Comments   Quit smoking cigarettes; now smokes cigars, wants to quit    No Known Allergies Objective:   There were no vitals filed for this visit.  There is no height or weight on file to calculate BMI. Constitutional Well developed. Well nourished.  Vascular Dorsalis pedis pulses palpable bilaterally. Posterior tibial pulses palpable bilaterally. Capillary refill normal to all digits.  No cyanosis or clubbing noted. Pedal hair growth normal.  Neurologic Normal speech. Oriented to person, place, and time. Protective sensation absent  Dermatologic Wound Location: Right transmetatarsal amputation site stump wound with fat layer exposed.  No purulent drainage noted does not probe down to bone. Wound Base: Mixed Granular/Fibrotic Peri-wound: Calloused Exudate: Scant/small amount Serosanguinous exudate Wound Measurements: -See above  Orthopedic: No pain to palpation either foot.   Radiographs: None Assessment:   No diagnosis found.      Plan:  Patient was evaluated and treated and all questions answered.  Ulcer right transmetatarsal amputation stump wound with fat layer exposed -Debridement as below. -Continue dressed with Betadine wet-to-dry, DSD. -Continue off-loading with cam boot -At this time and clinically not concerning for infection as the wound is not probing down to bone appears to be very superficial without any tunneling.  No correlation with third fourth and fifth metatarsal bone per previous MRI. -Continue blast X treatment. -Referral to the wound care center was placed  Procedure: Excisional Debridement of Wound~stagnant Tool: Sharp chisel  blade/tissue nipper Rationale: Removal of non-viable soft tissue from the wound to promote healing.  Anesthesia: none Pre-Debridement Wound Measurements: 1 cm x 0.5 cm x 0.4 cm  Post-Debridement Wound Measurements: 1.2 cm x 0.6 cm x 0.4 cm  Type of Debridement: Sharp Excisional Tissue Removed: Non-viable soft tissue Blood loss: Minimal (<50cc) Depth of Debridement: subcutaneous tissue. Technique: Sharp excisional debridement to bleeding, viable wound base.  Wound Progress: Also about the same patient ran out of wound VAC was unable to change his dressing Site healing conversation 7 Dressing: Dry, sterile, compression dressing. Disposition: Patient tolerated procedure well. Patient to return in 1 week for follow-up.  No follow-ups on file.

## 2022-12-28 NOTE — Telephone Encounter (Signed)
Referral faxed to [facility]-12/28/22

## 2022-12-28 NOTE — Telephone Encounter (Signed)
Referral refaxed to [facility], confirmation received-12/28/22

## 2022-12-28 NOTE — Telephone Encounter (Signed)
-----   Message from [name], DPM sent at 12/28/2022  2:32 PM EST ----- Regarding: Referral to the wound care center Hi Denetra Formoso,   Can you refer this patient to the wound care center.  My order and note is in

## 2023-01-09 ENCOUNTER — Encounter: Payer: Self-pay | Admitting: *Deleted

## 2023-01-10 DIAGNOSIS — E1165 Type 2 diabetes mellitus with hyperglycemia: Secondary | ICD-10-CM | POA: Diagnosis not present

## 2023-01-10 DIAGNOSIS — M86371 Chronic multifocal osteomyelitis, right ankle and foot: Secondary | ICD-10-CM | POA: Diagnosis not present

## 2023-01-10 DIAGNOSIS — E1152 Type 2 diabetes mellitus with diabetic peripheral angiopathy with gangrene: Secondary | ICD-10-CM | POA: Diagnosis not present

## 2023-01-10 DIAGNOSIS — E1140 Type 2 diabetes mellitus with diabetic neuropathy, unspecified: Secondary | ICD-10-CM | POA: Diagnosis not present

## 2023-01-10 DIAGNOSIS — R2681 Unsteadiness on feet: Secondary | ICD-10-CM | POA: Diagnosis not present

## 2023-01-10 DIAGNOSIS — G894 Chronic pain syndrome: Secondary | ICD-10-CM | POA: Diagnosis not present

## 2023-01-10 DIAGNOSIS — Z89512 Acquired absence of left leg below knee: Secondary | ICD-10-CM | POA: Diagnosis not present

## 2023-01-10 DIAGNOSIS — E1159 Type 2 diabetes mellitus with other circulatory complications: Secondary | ICD-10-CM | POA: Diagnosis not present

## 2023-01-10 DIAGNOSIS — Z89421 Acquired absence of other right toe(s): Secondary | ICD-10-CM | POA: Diagnosis not present

## 2023-01-10 DIAGNOSIS — I10 Essential (primary) hypertension: Secondary | ICD-10-CM | POA: Diagnosis not present

## 2023-01-17 ENCOUNTER — Ambulatory Visit: Payer: 59 | Admitting: Physician Assistant

## 2023-01-19 ENCOUNTER — Ambulatory Visit (INDEPENDENT_AMBULATORY_CARE_PROVIDER_SITE_OTHER): Payer: 59 | Admitting: Podiatry

## 2023-01-19 DIAGNOSIS — E1111 Type 2 diabetes mellitus with ketoacidosis with coma: Secondary | ICD-10-CM

## 2023-01-19 DIAGNOSIS — Z794 Long term (current) use of insulin: Secondary | ICD-10-CM | POA: Diagnosis not present

## 2023-01-19 DIAGNOSIS — L97512 Non-pressure chronic ulcer of other part of right foot with fat layer exposed: Secondary | ICD-10-CM | POA: Diagnosis not present

## 2023-01-19 NOTE — Progress Notes (Signed)
Subjective:  Patient ID: Eugene Diaz, male    DOB: 28-Aug-1966,  MRN: [id number]  Chief Complaint  Patient presents with   Foot Ulcer    57 y.o. male presents for wound care.  Patient presents with follow-up to right transmetatarsal amputation stump.  He states is doing okay.  Denies any other acute complaints wound is about the same.  He states is scheduled go to the wound care center up in 2 weeks.  Review of Systems: Negative except as noted in the HPI. Denies N/V/F/Ch.  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[place]) 05/23/2022   Depression    Diabetic neuropathy ([place])    Edema of both lower extremities    Essential tremor    neurologist-- dr Yarimar Lavis--- due to alcohol abuse   GERD (gastroesophageal reflux disease)    History of cellulitis 2020   left lower leg;   recurrent 03-25-2020   History of esophageal stricture    s/p  dilatation 02-13-2018   History of osteomyelitis 2020   left great toe     Hypercholesterolemia    Hypertension    followed by pcp  (nuclear stress test 03-11-2014 low risk w/ no ischemia, ef 65%   Normocytic anemia    followed by pcp   (03-18-2020 had transfusion's 02/ 2021)   Osteomyelitis of great toe of right foot ([place]) 08/18/2021   Peripheral vascular disease ([place])    Severe depression ([place]) 06/09/2021   Status post incision and drainage followed by Dr [name] and pcp   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   (03-17-2020  pt states most of incision is healed with exception an area that is still draining,  daily dressing change),  foot is red but not warm to the touch and has swelling but has improved)   Suicidal ideation 06/09/2021   Syncope and collapse 06/07/2022   Syncope, near 06/20/2022   Thrombocytopenia ([place])    chronic   Type 2  diabetes mellitus ([place])    followed by pcp---    (03-18-2020  pt stated does not check blood sugar)   Vaccine counseling 08/29/2022    Current Outpatient Medications:    acetaminophen (TYLENOL) 325 MG tablet, Take 2 tablets (650 mg total) by mouth every 6 (six) hours as needed for mild pain., Disp: , Rfl:    albuterol (VENTOLIN HFA) 108 (90 Base) MCG/ACT inhaler, Inhale 1-2 puffs into the lungs every 4 (four) hours as needed for wheezing or shortness of breath., Disp: , Rfl:    ALPRAZolam (XANAX) 1 MG tablet, Take 1 mg by mouth 4 (four) times daily., Disp: , Rfl:    amitriptyline (ELAVIL) 100 MG tablet, Take 100 mg by mouth at bedtime., Disp: , Rfl:    amLODipine (NORVASC) 10 MG tablet, Take 1 tablet (10 mg total) by mouth daily., Disp: 90 tablet, Rfl: 3   amoxicillin-clavulanate (AUGMENTIN) 875-125 MG tablet, Take 1 tablet by mouth 2 (two) times daily., Disp: 60 tablet, Rfl: 5   diclofenac Sodium (VOLTAREN) 1 % GEL, Apply 4 g topically 4 (four) times daily as needed (Apply to painful area over left upper front chest.)., Disp: 50 g, Rfl: 0   doxycycline (VIBRA-TABS) 100 MG tablet, Take 1 tablet (  100 mg total) by mouth 2 (two) times daily., Disp: 60 tablet, Rfl: 5   folic acid (FOLVITE) 1 MG tablet, Take 1 tablet (1 mg total) by mouth daily., Disp: 30 tablet, Rfl: 0   lisinopril (ZESTRIL) 10 MG tablet, Take 1 tablet (10 mg total) by mouth daily., Disp: 90 tablet, Rfl: 3   metFORMIN (GLUCOPHAGE) 500 MG tablet, Take 500 mg by mouth 2 (two) times daily., Disp: , Rfl:    morphine (MS CONTIN) 30 MG 12 hr tablet, Take 30 mg by mouth every 12 (twelve) hours., Disp: , Rfl:    Multiple Vitamin (MULTIVITAMIN WITH MINERALS) TABS tablet, Take 1 tablet by mouth daily., Disp: , Rfl:    oxycodone (ROXICODONE) 30 MG immediate release tablet, Take 30 mg by mouth 3 (three) times daily., Disp: , Rfl:    rosuvastatin (CRESTOR) 10 MG tablet, Take 1 tablet (10 mg total) by mouth daily., Disp: 90 tablet, Rfl: 3    thiamine 100 MG tablet, Take 1 tablet (100 mg total) by mouth daily., Disp: 30 tablet, Rfl: 0  Social History   Tobacco Use  Smoking Status Some Days   Packs/day: 2.50   Years: 25.00   Additional pack years: 0.00   Total pack years: 62.50   Types: Cigarettes, Cigars   Last attempt to quit: 08/05/1984   Years since quitting: 38.5  Smokeless Tobacco Never  Tobacco Comments   Quit smoking cigarettes; now smokes cigars, wants to quit    No Known Allergies Objective:   There were no vitals filed for this visit.  There is no height or weight on file to calculate BMI. Constitutional Well developed. Well nourished.  Vascular Dorsalis pedis pulses palpable bilaterally. Posterior tibial pulses palpable bilaterally. Capillary refill normal to all digits.  No cyanosis or clubbing noted. Pedal hair growth normal.  Neurologic Normal speech. Oriented to person, place, and time. Protective sensation absent  Dermatologic Wound Location: Right transmetatarsal amputation site stump wound with fat layer exposed.  No purulent drainage noted does not probe down to bone. Wound Base: Mixed Granular/Fibrotic Peri-wound: Calloused Exudate: Scant/small amount Serosanguinous exudate Wound Measurements: -See above  Orthopedic: No pain to palpation either foot.   Radiographs: None Assessment:   No diagnosis found.      Plan:  Patient was evaluated and treated and all questions answered.  Ulcer right transmetatarsal amputation stump wound with fat layer exposed -Debridement as below. -Continue dressed with Betadine wet-to-dry, DSD. -Continue off-loading with cam boot -At this time and clinically not concerning for infection as the wound is not probing down to bone appears to be very superficial without any tunneling.  No correlation with third fourth and fifth metatarsal bone per previous MRI.  -Patient has a wound care center appointment in 2 weeks for advanced wound care  Procedure:  Excisional Debridement of Wound~stagnant Tool: Sharp chisel blade/tissue nipper Rationale: Removal of non-viable soft tissue from the wound to promote healing.  Anesthesia: none Pre-Debridement Wound Measurements: 1 cm x 0.5 cm x 0.4 cm  Post-Debridement Wound Measurements: 1.2 cm x 0.6 cm x 0.4 cm  Type of Debridement: Sharp Excisional Tissue Removed: Non-viable soft tissue Blood loss: Minimal (<50cc) Depth of Debridement: subcutaneous tissue. Technique: Sharp excisional debridement to bleeding, viable wound base.  Wound Progress: Wound measurements are stagnant has not decreased Site healing conversation 7 Dressing: Dry, sterile, compression dressing. Disposition: Patient tolerated procedure well. Patient to return in 1 week for follow-up.  No follow-ups on file.

## 2023-02-08 DIAGNOSIS — I10 Essential (primary) hypertension: Secondary | ICD-10-CM | POA: Diagnosis not present

## 2023-02-08 DIAGNOSIS — E1152 Type 2 diabetes mellitus with diabetic peripheral angiopathy with gangrene: Secondary | ICD-10-CM | POA: Diagnosis not present

## 2023-02-08 DIAGNOSIS — M150 Primary generalized (osteo)arthritis: Secondary | ICD-10-CM | POA: Diagnosis not present

## 2023-02-08 DIAGNOSIS — E1140 Type 2 diabetes mellitus with diabetic neuropathy, unspecified: Secondary | ICD-10-CM | POA: Diagnosis not present

## 2023-02-08 DIAGNOSIS — G894 Chronic pain syndrome: Secondary | ICD-10-CM | POA: Diagnosis not present

## 2023-02-08 DIAGNOSIS — E1169 Type 2 diabetes mellitus with other specified complication: Secondary | ICD-10-CM | POA: Diagnosis not present

## 2023-02-08 DIAGNOSIS — E1159 Type 2 diabetes mellitus with other circulatory complications: Secondary | ICD-10-CM | POA: Diagnosis not present

## 2023-02-08 DIAGNOSIS — G629 Polyneuropathy, unspecified: Secondary | ICD-10-CM | POA: Diagnosis not present

## 2023-02-08 DIAGNOSIS — F419 Anxiety disorder, unspecified: Secondary | ICD-10-CM | POA: Diagnosis not present

## 2023-02-14 ENCOUNTER — Ambulatory Visit: Payer: Self-pay | Admitting: Physician Assistant

## 2023-02-15 ENCOUNTER — Other Ambulatory Visit (HOSPITAL_BASED_OUTPATIENT_CLINIC_OR_DEPARTMENT_OTHER): Payer: Self-pay | Admitting: *Deleted

## 2023-02-15 DIAGNOSIS — I10 Essential (primary) hypertension: Secondary | ICD-10-CM

## 2023-02-15 DIAGNOSIS — I25118 Atherosclerotic heart disease of native coronary artery with other forms of angina pectoris: Secondary | ICD-10-CM

## 2023-02-15 DIAGNOSIS — E785 Hyperlipidemia, unspecified: Secondary | ICD-10-CM

## 2023-02-15 MED ORDER — ROSUVASTATIN CALCIUM 10 MG PO TABS: 10.0000 mg | ORAL_TABLET | Freq: Every day | ORAL | 3 refills | Status: DC

## 2023-02-15 MED ORDER — AMLODIPINE BESYLATE 10 MG PO TABS: 10.0000 mg | ORAL_TABLET | Freq: Every day | ORAL | 3 refills | Status: DC

## 2023-02-15 NOTE — Telephone Encounter (Signed)
Received request from [facility] for refill in Amlodipine 10 mg daily and Rosuvastatin 10 mg daily Rx sent as requested

## 2023-02-16 ENCOUNTER — Ambulatory Visit (INDEPENDENT_AMBULATORY_CARE_PROVIDER_SITE_OTHER): Payer: Medicare HMO | Admitting: Podiatry

## 2023-02-16 DIAGNOSIS — E1111 Type 2 diabetes mellitus with ketoacidosis with coma: Secondary | ICD-10-CM | POA: Diagnosis not present

## 2023-02-16 DIAGNOSIS — Z794 Long term (current) use of insulin: Secondary | ICD-10-CM

## 2023-02-16 DIAGNOSIS — L97512 Non-pressure chronic ulcer of other part of right foot with fat layer exposed: Secondary | ICD-10-CM | POA: Diagnosis not present

## 2023-02-16 MED ORDER — DOXYCYCLINE HYCLATE 100 MG PO TABS: 100.0000 mg | ORAL_TABLET | Freq: Two times a day (BID) | ORAL | 0 refills | Status: AC

## 2023-02-16 MED ORDER — LISINOPRIL 10 MG PO TABS: 10.0000 mg | ORAL_TABLET | Freq: Every day | ORAL | 3 refills | Status: DC

## 2023-02-16 NOTE — Telephone Encounter (Signed)
Received request for Lisinopril to be sent, Rx sent as requested

## 2023-02-16 NOTE — Addendum Note (Signed)
Addended by: Regis Bill B on: 02/16/2023 04:29 PM   Modules accepted: Orders

## 2023-02-20 ENCOUNTER — Ambulatory Visit (INDEPENDENT_AMBULATORY_CARE_PROVIDER_SITE_OTHER): Payer: Medicare HMO | Admitting: Infectious Disease

## 2023-02-20 ENCOUNTER — Encounter: Payer: Self-pay | Admitting: Infectious Disease

## 2023-02-20 ENCOUNTER — Other Ambulatory Visit: Payer: Self-pay

## 2023-02-20 VITALS — BP 144/88 | HR 85 | Temp 97.4°F

## 2023-02-20 DIAGNOSIS — I10 Essential (primary) hypertension: Secondary | ICD-10-CM

## 2023-02-20 DIAGNOSIS — E11628 Type 2 diabetes mellitus with other skin complications: Secondary | ICD-10-CM | POA: Diagnosis not present

## 2023-02-20 DIAGNOSIS — E1152 Type 2 diabetes mellitus with diabetic peripheral angiopathy with gangrene: Secondary | ICD-10-CM

## 2023-02-20 DIAGNOSIS — L089 Local infection of the skin and subcutaneous tissue, unspecified: Secondary | ICD-10-CM | POA: Diagnosis not present

## 2023-02-20 DIAGNOSIS — M86371 Chronic multifocal osteomyelitis, right ankle and foot: Secondary | ICD-10-CM | POA: Diagnosis not present

## 2023-02-20 DIAGNOSIS — E1142 Type 2 diabetes mellitus with diabetic polyneuropathy: Secondary | ICD-10-CM

## 2023-02-20 DIAGNOSIS — E538 Deficiency of other specified B group vitamins: Secondary | ICD-10-CM | POA: Diagnosis not present

## 2023-02-20 NOTE — Progress Notes (Signed)
Subjective:  Chief complaint: followup for osteomyelitis in context of neuropathy   Patient ID: Eugene Diaz, male    DOB: [birth year], 57 y.o.   MRN: [id number]  HPI  Eugene Diaz is a 57y.o. male alcoholic neuropathy diabetes mellitus and osteomyelitis who is well-known to me from my having seen him as an inpatient and followed him in our clinic.  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  He had additional further debridement as well with bone biopsy of the first metatarsal and wound closure.  He grew E. Coli group B streptococcus as well as beta-lactamase producing Prevotella.  He was narrowed to intravenous cefazolin with oral metronidazole and discharged home with this with his sister helping him with antibiotics.  We switched him over the IV therapy to Augmentin and he was followed by Dr. Samuella Cota by podiatry.  He had some significant problems with depression and passive suicidal ideation and related to the loss of his wife many years ago.  He improved significantly emotionally and actually had reduced his alcohol intake drastically.  He is not completely clean but his intake is dramatically less than in the past when he drank on a daily basis.  Followed him closely and saw him last in November 2022.   He came off his antibiotics and he came back and was seen by my partner Dr. Luciana Axe and seem to have no evidence of active infection at that point in time.   Unfortunately interim he developed worsening gangrenous changes involving the third toe and underwent transmetatarsal amputation with Dr. Marylene Land on December 04, 2021.  Apparently approxi a month ago his foot got caught on a nail that was sticking out of his  porch.  He developed intermittent redness and swelling that worsened recently and was convinced by family come to the emergency department with worsening flexion of his feet.  His foot was warm and swollen and erythematous.  MRI was performed at [facility] which showed a new soft tissue ulcer in the medial forefoot distal to the coronal and medial to the first metatarsal amputation stump.  There is edema and swelling of the ankle and dorsal midfoot and there is marrow edema within the third and fourth and remaining fifth metatarsal shafts as well as some mild edema in the distal aspect the remaining first and second metatarsal shafts all concerning for osteomyelitis.   Patient was seen by general surgery at [facility] he did not want to intervene surgically.  They wanted to try antibiotics.  I was very skeptical that antibiotics were going to get [place] very far but I i ertapenem with doxycycline June 30, 2022.  In the interim he was hospitalized with an episode of unresponsiveness that was thought to be due to too many sedating medications from home though he is seen Chilton Si with cardiology who seems to think the patient had a near syncopal event rather than a drug related overdose.  Patient has had a monitor on since then.  He is also seen Dr. Allena Katz for his foot but it seems that Dr. Allena Katz is not aware (based on his notes prior to my last visiit  the patient's recent hospital admission or MRI.  He has completed IV antibiotics and  was on oral doxycycline and cefdinir.  His wound  continued to improve and he is continue to follow with Dr. Allena Katz.  Dr Allena Katz did not feel that the MRI findings correlated with exam and that he was skeptical of osteomyelitis.   Had seen Haysi in September I thought we were stopping antibiotics but turns out his pharmacy was still sending cefdinir and doxycycline at last visit in November I again thought we had conclusively stopped both antibiotics but I again  see that they are both being filled Alexandra is not sure whether he is actually taking them or getting them but certainly the dispense history shows that [name] is feeling them every month.  In the interim was hospitalized in December 13 with foot swelling increased pain erythema and warmth and worsening serous drainage from his foot wounds.  He had presented to the podiatry clinic and after they examined him they directed him to come to the emergency department for management of infection and initiation of IV antibiotics.   Was started vancomycin cefepime and Flagyl in the ER.   MRI of the right foot was performed which showed onyx soft tissue and ulceration with mild marrow T2 hypertensive in the metatarsal shafts 3-5th  that had improved in the right forefoot and no T1 abnormality or progressive cortical destruction seen with improvement in subcutaneous edema on dorsum of the foot.   Podiatry saw him and felt no indication for surgical intervention and that this was "only cellulitis.  Patient was sent home on doxycycline.  He continued to have drainage from the 2 ulcers and the wounds appear worse to me than when I last examined him.  Eugene Diaz really remember much about why he was hospitalized and says his memory is getting worse.  Has developed an ulceration on his left ankle where of the foot brace seems to be rubbing up against the skin.  We changed him over to Augmentin with doxycycline in place of the cefdinir since he had had cellulitis in the face of cefdinir and doxycycline.  Has remained on antibiotics since then.  He has been following closely with Dr. Allena Katz who has not found any evidence of deep infection.  He has had further debridement.  Wound still bleeds a fair amount.  He attributes this to partly being due to the dressing and the fact that he is having to walk on his 1 remaining foot still and that he applies pressure despite being in the boot.       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) 05/23/2022   Depression    Diabetic neuropathy (HCC)    Edema of both lower extremities    Essential tremor    neurologist-- dr patel--- due to alcohol abuse   GERD (gastroesophageal reflux disease)    History of cellulitis 2020   left lower leg;   recurrent 03-25-2020   History of esophageal stricture    s/p  dilatation 02-13-2018   History of osteomyelitis 2020   left great toe     Hypercholesterolemia    Hypertension    followed by pcp  (nuclear stress test 03-11-2014 low risk w/ no ischemia, ef 65%   Normocytic anemia  followed by pcp   (03-18-2020 had transfusion's 02/ 2021)   Osteomyelitis of great toe of right foot (HCC) 08/18/2021   Peripheral vascular disease (HCC)    Severe depression (HCC) 06/09/2021   Status post incision and drainage followed by Dr Samuella Cota and pcp   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   (03-17-2020  pt states most of incision is healed with exception an area that is still draining,  daily dressing change),  foot is red but not warm to the touch and has swelling but has improved)   Suicidal ideation 06/09/2021   Syncope and collapse 06/07/2022   Syncope, near 06/20/2022   Thrombocytopenia (HCC)    chronic   Type 2 diabetes mellitus (HCC)    followed by pcp---    (03-18-2020  pt stated does not check blood sugar)   Vaccine counseling 08/29/2022    Past Surgical History:  Procedure Laterality Date   AMPUTATION Left 10/16/2019   Procedure: Left partial second ray resection; placement of antibiotic beads;  Surgeon: Park Liter, DPM;  Location: [facility];  Service: Podiatry;  Laterality: Left;   AMPUTATION Left 11/13/2019   Procedure: Left Midfoot Amputation - Transmetatarsal vs. Lisfranc; Placement  antibiotic beads;  Surgeon: Park Liter, DPM;  Location: [facility];  Service: Podiatry;  Laterality: Left;   AMPUTATION Left 12/21/2019   Procedure: Chopart Amputation left foot;  Surgeon: Park Liter, DPM;  [facility]: [facility];  Service: Podiatry;  Laterality: Left;   AMPUTATION Left 06/24/2020   Procedure: LEFT BELOW KNEE AMPUTATION;  Surgeon: Nadara Mustard, MD;  Location: [facility];  Service: Orthopedics;  Laterality: Left;   AMPUTATION Right 03/29/2021   Procedure: AMPUTATION RAY;  Surgeon: Park Liter, DPM;  Location: [facility];  Service: Podiatry;  Laterality: Right;   AMPUTATION TOE Left 08/14/2019   Procedure: AMPUTATION GREAT TOE;  Surgeon: Park Liter, DPM;  [facility]: [facility];  Service: Podiatry;  Laterality: Left;   APPLICATION OF WOUND VAC Left 12/21/2019   Procedure: Application Of Wound Vac;  Surgeon: Park Liter, DPM;  [facility]: [facility];  Service: Podiatry;  Laterality: Left;   BIOPSY  02/13/2018   Procedure: BIOPSY;  Surgeon: West Bali, MD;  [facility];  Service: Endoscopy;;  transverse colon biopsy, gastric biopsy   COLONOSCOPY WITH PROPOFOL N/A 02/13/2018   Procedure: COLONOSCOPY WITH PROPOFOL;  Surgeon: West Bali, MD;  [facility];  Service: Endoscopy;  Laterality: N/A;  11:15am   ESOPHAGOGASTRODUODENOSCOPY (EGD) WITH PROPOFOL N/A 02/13/2018   Procedure: ESOPHAGOGASTRODUODENOSCOPY (EGD) WITH PROPOFOL;  Surgeon: West Bali, MD;  [facility];  Service: Endoscopy;  Laterality: N/A;   I & D EXTREMITY Left 07/16/2019   Procedure: Insicion  AND DEBRIDEMENT EXTREMITY;  Surgeon: Park Liter, DPM;  [facility];  Service: Podiatry;  Laterality: Left;   I & D EXTREMITY Left 06/22/2020   Procedure: IRRIGATION AND DEBRIDEMENT EXTREMITY, left foot and ankle;  Surgeon: Park Liter, DPM;  [facility];  Service: Podiatry;  Laterality: Left;   I & D EXTREMITY Right 03/29/2021   Procedure: IRRIGATION AND DEBRIDEMENT  EXTREMITY;  Surgeon: Park Liter, DPM;  [facility];  Service: Podiatry;  Laterality: Right;   INCISION AND DRAINAGE OF WOUND Left 10/13/2019   Procedure: IRRIGATION AND DEBRIDEMENT WOUND OF LEFT FOOT AND FIRST METATARSAL RESECTION;  Surgeon: Park Liter, DPM;  [facility];  Service: Podiatry;  Laterality: Left;  IRRIGATION AND DEBRIDEMENT FOOT Left 11/11/2019   Procedure: Left Foot Wound Irrigation and Debridement;  Surgeon: Park Liter, DPM;  [facility]: [facility];  Service: Podiatry;  Laterality: Left;   Left arm     Left arm repair (tendon and artery)   PERCUTANEOUS PINNING  07/16/2019   Procedure: Open Reduction Percutaneous Pinning Extremity;  Surgeon: Park Liter, DPM;  [facility];  Service: Podiatry;;   PILONIDAL CYST EXCISION N/A 08/08/2014   Procedure: CYST EXCISION PILONIDAL EXTENSIVE;  Surgeon: Dalia Heading, MD;  [facility]: [facility];  Service: General;  Laterality: N/A;   POLYPECTOMY  02/13/2018   Procedure: POLYPECTOMY;  Surgeon: West Bali, MD;  [facility];  Service: Endoscopy;;  transverse colon polyp hs, rectal polyps times 2   SAVORY DILATION N/A 02/13/2018   Procedure: SAVORY DILATION;  Surgeon: West Bali, MD;  [facility];  Service: Endoscopy;  Laterality: N/A;   TENDON LENGTHENING Right 12/04/2021   Procedure: TENDON LENGTHENING;  Surgeon: Asencion Islam, DPM;  [facility];  Service: Podiatry;  Laterality: Right;   TRANSMETATARSAL AMPUTATION Right 12/04/2021   Procedure: TRANSMETATARSAL AMPUTATION;  Surgeon: Asencion Islam, DPM;  [facility];  Service: Podiatry;  Laterality: Right;   WOUND DEBRIDEMENT Left 09/04/2019   Procedure: DEBRIDEMENT WOUND WITH COMPLEX  REPAIR OF DEHISCENCE;  Surgeon: Park Liter, DPM;  Location: [facility];  Service: Podiatry;  Laterality: Left;  Leave patient on stretcher   WOUND DEBRIDEMENT Left 10/16/2019   Procedure: Left foot wound debridement and closure;   Surgeon: Park Liter, DPM;  Location: [facility];  Service: Podiatry;  Laterality: Left;   WOUND DEBRIDEMENT Left 12/18/2019   Procedure: LEFT FOOT DEBRIDEMENT WITH PARTIAL INCISION OF INFECTED BONE;  Surgeon: Park Liter, DPM;  [facility];  Service: Podiatry;  Laterality: Left;  LEFT FOOT DEBRIDEMENT WITH PARTIAL INCISION OF INFECTED BONE   WOUND DEBRIDEMENT Right 04/01/2021   Procedure: Right foot wound debridement and irrigation and closure, bone biopsy;  Surgeon: Park Liter, DPM;  [facility];  Service: Podiatry;  Laterality: Right;    Family History  Problem Relation Age of Onset   Heart attack Mother        Living, 55   Cancer Father        Deceased   Healthy Brother    Healthy Sister    Colon cancer Neg Hx    Colon polyps Neg Hx       Social History   Socioeconomic History   Marital status: Widowed    Spouse name: Not on file   Number of children: 0   Years of education: Not on file   Highest education level: 8th grade  Occupational History   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  Tobacco Use   Smoking status: Some Days    Packs/day: 2.50    Years: 25.00    Additional pack years: 0.00    Total pack years: 62.50    Types: Cigarettes, Cigars    Last attempt to quit: 08/05/1984    Years since quitting: 38.5   Smokeless tobacco: Never   Tobacco comments:    Quit smoking cigarettes; now smokes cigars, wants to quit  Vaping Use   Vaping Use: Never used  Substance and Sexual Activity   Alcohol use: Yes    Alcohol/week: 50.0 standard drinks of alcohol    Types: 50 Standard drinks or equivalent per  week    Comment: Drinks 6 beers, liquor 4-6oz (both daily) x 35 years; now states he "drinks a few beers and let it go"   Drug use: Not Currently    Types: Marijuana   Sexual activity: Yes    Birth control/protection: None  Other Topics Concern   Not on file  Social History Narrative   USED TO WORK at [employer]	[place] trail[place]NOW ON DISABILITY. Highest level of education:  8th grade   He lives with wife.  No children.   Left handed   Drinks soda, water-no coffee, no tea   Social Determinants of Health   Financial Resource Strain: Not on file  Food Insecurity: No Food Insecurity (10/13/2022)   Hunger Vital Sign    Worried About Running Out of Food in the Last Year: Never true    Ran Out of Food in the Last Year: Never true  Transportation Needs: No Transportation Needs (10/13/2022)   PRAPARE - Administrator, Civil Service (Medical): No    Lack of Transportation (Non-Medical): No  Physical Activity: Not on file  Stress: Not on file  Social Connections: Not on file    No Known Allergies   Current Outpatient Medications:    acetaminophen (TYLENOL) 325 MG tablet, Take 2 tablets (650 mg total) by mouth every 6 (six) hours as needed for mild pain., Disp: , Rfl:    albuterol (VENTOLIN HFA) 108 (90 Base) MCG/ACT inhaler, Inhale 1-2 puffs into the lungs every 4 (four) hours as needed for wheezing or shortness of breath., Disp: , Rfl:    ALPRAZolam (XANAX) 1 MG tablet, Take 1 mg by mouth 4 (four) times daily., Disp: , Rfl:    amitriptyline (ELAVIL) 100 MG tablet, Take 100 mg by mouth at bedtime., Disp: , Rfl:    amLODipine (NORVASC) 10 MG tablet, Take 1 tablet (10 mg total) by mouth daily., Disp: 90 tablet, Rfl: 3   amoxicillin-clavulanate (AUGMENTIN) 875-125 MG tablet, Take 1 tablet by mouth 2 (two) times daily., Disp: 60 tablet, Rfl: 5   canagliflozin (INVOKANA) 300 MG TABS tablet, , Disp: , Rfl:    cefdinir (OMNICEF) 300 MG capsule, Take 300 mg by mouth 2 (two) times daily., Disp: , Rfl:    diclofenac Sodium (VOLTAREN) 1 % GEL, Apply 4 g topically 4 (four) times daily as needed (Apply to painful area over left upper front chest.)., Disp: 50 g, Rfl: 0   doxycycline (VIBRA-TABS) 100 MG tablet, Take 1 tablet (100 mg total) by mouth 2 (two) times daily., Disp: 60  tablet, Rfl: 5   doxycycline (VIBRA-TABS) 100 MG tablet, Take 1 tablet (100 mg total) by mouth 2 (two) times daily., Disp: 60 tablet, Rfl: 0   folic acid (FOLVITE) 1 MG tablet, Take 1 tablet (1 mg total) by mouth daily., Disp: 30 tablet, Rfl: 0   gabapentin (NEURONTIN) 300 MG capsule, Take by mouth., Disp: , Rfl:    lisinopril (ZESTRIL) 10 MG tablet, Take 1 tablet (10 mg total) by mouth daily., Disp: 90 tablet, Rfl: 3   metFORMIN (GLUCOPHAGE) 500 MG tablet, Take 500 mg by mouth 2 (two) times daily., Disp: , Rfl:    morphine (MS CONTIN) 30 MG 12 hr tablet, Take 30 mg by mouth every 12 (twelve) hours., Disp: , Rfl:    Multiple Vitamin (MULTIVITAMIN WITH MINERALS) TABS tablet, Take 1 tablet by mouth daily., Disp: , Rfl:    oxycodone (ROXICODONE) 30 MG immediate release tablet, Take 30 mg by  mouth 3 (three) times daily., Disp: , Rfl:    rosuvastatin (CRESTOR) 10 MG tablet, Take 1 tablet (10 mg total) by mouth daily., Disp: 90 tablet, Rfl: 3   thiamine 100 MG tablet, Take 1 tablet (100 mg total) by mouth daily., Disp: 30 tablet, Rfl: 0   Review of Systems  Constitutional:  Negative for activity change, appetite change, chills, diaphoresis, fatigue, fever and unexpected weight change.  HENT:  Negative for congestion, rhinorrhea, sinus pressure, sneezing, sore throat and trouble swallowing.   Eyes:  Negative for photophobia and visual disturbance.  Respiratory:  Negative for cough, chest tightness, shortness of breath, wheezing and stridor.   Cardiovascular:  Negative for chest pain, palpitations and leg swelling.  Gastrointestinal:  Negative for abdominal distention, abdominal pain, anal bleeding, blood in stool, constipation, diarrhea, nausea and vomiting.  Genitourinary:  Negative for difficulty urinating, dysuria, flank pain and hematuria.  Musculoskeletal:  Negative for arthralgias, back pain, gait problem, joint swelling and myalgias.  Skin:  Positive for wound. Negative for color change, pallor  and rash.  Neurological:  Negative for dizziness, tremors, weakness, light-headedness and headaches.  Hematological:  Negative for adenopathy. Does not bruise/bleed easily.  Psychiatric/Behavioral:  Negative for agitation, behavioral problems, confusion, decreased concentration, dysphoric mood, sleep disturbance and suicidal ideas.        Objective:   Physical Exam Constitutional:      Appearance: He is well-developed.  HENT:     Head: Normocephalic and atraumatic.  Eyes:     Conjunctiva/sclera: Conjunctivae normal.  Cardiovascular:     Rate and Rhythm: Normal rate and regular rhythm.  Pulmonary:     Effort: Pulmonary effort is normal. No respiratory distress.     Breath sounds: No wheezing.  Abdominal:     General: There is no distension.     Palpations: Abdomen is soft.  Musculoskeletal:        General: No tenderness. Normal range of motion.     Cervical back: Normal range of motion and neck supple.  Skin:    General: Skin is warm and dry.     Coloration: Skin is not pale.     Findings: No erythema or rash.  Neurological:     General: No focal deficit present.     Mental Status: He is alert and oriented to person, place, and time.  Psychiatric:        Mood and Affect: Mood normal.        Behavior: Behavior normal.        Thought Content: Thought content normal.        Judgment: Judgment normal.     Right lower extremity 06/20/2022:    Right foot 07/20/2022:    Foot 08/29/2022:     10/19/2022:    Right foot 11/23/2022:            Right foot 02/20/2023:       Assessment & Plan:  Osteomyelitis involving the right foot Check a sed rate CRP CBC and BMP with GFR.  I would actually like to trial him off antibiotics provide of these labs are reassuring   I have personally spent 32 minutes involved in face-to-face and non-face-to-face activities for this patient on the day of the visit. Professional time spent includes the following activities:  Preparing to see the patient (review of tests), Obtaining and/or reviewing separately obtained history (admission/discharge record), Performing a medically appropriate examination and/or evaluation , Ordering medications/tests/procedures, referring and communicating with other health care professionals, Documenting clinical  information in the EMR, Independently interpreting results (not separately reported), Communicating results to the patient/family/caregiver, Counseling and educating the patient/family/caregiver and Care coordination (not separately reported).

## 2023-02-21 ENCOUNTER — Telehealth: Payer: Self-pay

## 2023-02-21 LAB — COMPLETE METABOLIC PANEL WITH GFR
AG Ratio: 0.7 (calc) — ABNORMAL LOW (ref 1.0–2.5)
ALT: 6 U/L — ABNORMAL LOW (ref 9–46)
AST: 11 U/L (ref 10–35)
Albumin: 3.3 g/dL — ABNORMAL LOW (ref 3.6–5.1)
Alkaline phosphatase (APISO): 91 U/L (ref 35–144)
BUN: 11 mg/dL (ref 7–25)
CO2: 25 mmol/L (ref 20–32)
Calcium: 8.9 mg/dL (ref 8.6–10.3)
Chloride: 104 mmol/L (ref 98–110)
Creat: 0.78 mg/dL (ref 0.70–1.30)
Globulin: 4.9 g/dL (calc) — ABNORMAL HIGH (ref 1.9–3.7)
Glucose, Bld: 148 mg/dL — ABNORMAL HIGH (ref 65–99)
Potassium: 4.2 mmol/L (ref 3.5–5.3)
Sodium: 137 mmol/L (ref 135–146)
Total Bilirubin: 0.6 mg/dL (ref 0.2–1.2)
Total Protein: 8.2 g/dL — ABNORMAL HIGH (ref 6.1–8.1)
eGFR: 103 mL/min/{1.73_m2} (ref >=60)

## 2023-02-21 LAB — CBC WITH DIFFERENTIAL/PLATELET
Absolute Monocytes: 377 cells/uL (ref 200–950)
Basophils Absolute: 49 cells/uL (ref 0–200)
Basophils Relative: 0.6 %
Eosinophils Absolute: 164 cells/uL (ref 15–500)
Eosinophils Relative: 2 %
HCT: 39.7 % (ref 38.5–50.0)
Hemoglobin: 13.4 g/dL (ref 13.2–17.1)
Lymphs Abs: 1911 cells/uL (ref 850–3900)
MCH: 30.2 pg (ref 27.0–33.0)
MCHC: 33.8 g/dL (ref 32.0–36.0)
MCV: 89.6 fL (ref 80.0–100.0)
MPV: 10 fL (ref 7.5–12.5)
Monocytes Relative: 4.6 %
Neutro Abs: 5699 cells/uL (ref 1500–7800)
Neutrophils Relative %: 69.5 %
Platelets: 291 10*3/uL (ref 140–400)
RBC: 4.43 10*6/uL (ref 4.20–5.80)
RDW: 12.7 % (ref 11.0–15.0)
Total Lymphocyte: 23.3 %
WBC: 8.2 10*3/uL (ref 3.8–10.8)

## 2023-02-21 LAB — C-REACTIVE PROTEIN: CRP: 50.4 mg/L — ABNORMAL HIGH (ref <8.0)

## 2023-02-21 LAB — SEDIMENTATION RATE: Sed Rate: 106 mm/h — ABNORMAL HIGH (ref 0–20)

## 2023-02-21 NOTE — Telephone Encounter (Signed)
-----   Message from Randall Hiss, MD sent at 02/21/2023  1:55 PM EDT ----- Regarding: FW: His labs are headed in the wrong direction. Can we have him start back on augmentin and doxycycline and I think we're going to need another mri ----- Message ----- From: Interface, [facility] Lab Results In Sent: 02/21/2023  12:06 AM EDT To: Randall Hiss, MD

## 2023-02-21 NOTE — Telephone Encounter (Signed)
I attempted reach Eugene Diaz to relay lab results. Patient did not answer. LVM for him to call back.  [name] T Pricilla Loveless

## 2023-02-21 NOTE — Progress Notes (Signed)
Subjective:  Patient ID: Eugene Diaz, male    DOB: [birth year],  MRN: [id number]  Chief Complaint  Patient presents with   Foot Ulcer    Right foot ulcer follow up     57 y.o. male presents for wound care.  Patient presents with follow-up to right transmetatarsal amputation stump.  He states is doing okay.  Denies any other acute complaints wound is about the same.  He states he is missed his wound care center appointment.  He will try to get back in Review of Systems: Negative except as noted in the HPI. Denies N/V/F/Ch.  Past Medical History:  Diagnosis Date   Alcohol abuse    Alcoholic peripheral neuropathy    Anxiety    Arthritis    Asthma    followed by pcp   B12 deficiency    CAD in native artery 06/07/2022   Chronic multifocal osteomyelitis of right foot 05/23/2022   Depression    Diabetic neuropathy    Edema of both lower extremities    Essential tremor    neurologist-- dr Jamaris Theard--- due to alcohol abuse   GERD (gastroesophageal reflux disease)    History of cellulitis 2020   left lower leg;   recurrent 03-25-2020   History of esophageal stricture    s/p  dilatation 02-13-2018   History of osteomyelitis 2020   left great toe     Hypercholesterolemia    Hypertension    followed by pcp  (nuclear stress test 03-11-2014 low risk w/ no ischemia, ef 65%   Normocytic anemia    followed by pcp   (03-18-2020 had transfusion's 02/ 2021)   Osteomyelitis of great toe of right foot 08/18/2021   Peripheral vascular disease    Severe depression 06/09/2021   Status post incision and drainage followed by Dr Samuella Cota and pcp   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   (03-17-2020  pt states most of incision is healed with exception an area that is still draining,  daily dressing change),  foot is red but not warm to the touch and has swelling but has improved)   Suicidal ideation 06/09/2021   Syncope and collapse 06/07/2022   Syncope, near 06/20/2022   Thrombocytopenia    chronic    Type 2 diabetes mellitus    followed by pcp---    (03-18-2020  pt stated does not check blood sugar)   Vaccine counseling 08/29/2022    Current Outpatient Medications:    canagliflozin (INVOKANA) 300 MG TABS tablet, , Disp: , Rfl:    cefdinir (OMNICEF) 300 MG capsule, Take 300 mg by mouth 2 (two) times daily., Disp: , Rfl:    doxycycline (VIBRA-TABS) 100 MG tablet, Take 1 tablet (100 mg total) by mouth 2 (two) times daily. (Patient not taking: Reported on 02/20/2023), Disp: 60 tablet, Rfl: 0   gabapentin (NEURONTIN) 300 MG capsule, Take by mouth., Disp: , Rfl:    acetaminophen (TYLENOL) 325 MG tablet, Take 2 tablets (650 mg total) by mouth every 6 (six) hours as needed for mild pain., Disp: , Rfl:    albuterol (VENTOLIN HFA) 108 (90 Base) MCG/ACT inhaler, Inhale 1-2 puffs into the lungs every 4 (four) hours as needed for wheezing or shortness of breath., Disp: , Rfl:    ALPRAZolam (XANAX) 1 MG tablet, Take 1 mg by mouth 4 (four) times daily., Disp: , Rfl:    amitriptyline (ELAVIL) 100 MG tablet, Take 100 mg by mouth at bedtime., Disp: , Rfl:    amLODipine (  NORVASC) 10 MG tablet, Take 1 tablet (10 mg total) by mouth daily., Disp: 90 tablet, Rfl: 3   amoxicillin-clavulanate (AUGMENTIN) 875-125 MG tablet, Take 1 tablet by mouth 2 (two) times daily., Disp: 60 tablet, Rfl: 5   diclofenac Sodium (VOLTAREN) 1 % GEL, Apply 4 g topically 4 (four) times daily as needed (Apply to painful area over left upper front chest.)., Disp: 50 g, Rfl: 0   doxycycline (VIBRA-TABS) 100 MG tablet, Take 1 tablet (100 mg total) by mouth 2 (two) times daily., Disp: 60 tablet, Rfl: 5   folic acid (FOLVITE) 1 MG tablet, Take 1 tablet (1 mg total) by mouth daily., Disp: 30 tablet, Rfl: 0   lisinopril (ZESTRIL) 10 MG tablet, Take 1 tablet (10 mg total) by mouth daily., Disp: 90 tablet, Rfl: 3   metFORMIN (GLUCOPHAGE) 500 MG tablet, Take 500 mg by mouth 2 (two) times daily., Disp: , Rfl:    morphine (MS CONTIN) 30 MG 12 hr  tablet, Take 30 mg by mouth every 12 (twelve) hours., Disp: , Rfl:    Multiple Vitamin (MULTIVITAMIN WITH MINERALS) TABS tablet, Take 1 tablet by mouth daily., Disp: , Rfl:    oxycodone (ROXICODONE) 30 MG immediate release tablet, Take 30 mg by mouth 3 (three) times daily., Disp: , Rfl:    rosuvastatin (CRESTOR) 10 MG tablet, Take 1 tablet (10 mg total) by mouth daily., Disp: 90 tablet, Rfl: 3   thiamine 100 MG tablet, Take 1 tablet (100 mg total) by mouth daily., Disp: 30 tablet, Rfl: 0  Social History   Tobacco Use  Smoking Status Some Days   Packs/day: 2.50   Years: 25.00   Additional pack years: 0.00   Total pack years: 62.50   Types: Cigarettes, Cigars   Last attempt to quit: 08/05/1984   Years since quitting: 38.5  Smokeless Tobacco Never  Tobacco Comments   Quit smoking cigarettes; now smokes cigars, wants to quit    No Known Allergies Objective:   There were no vitals filed for this visit.  There is no height or weight on file to calculate BMI. Constitutional Well developed. Well nourished.  Vascular Dorsalis pedis pulses palpable bilaterally. Posterior tibial pulses palpable bilaterally. Capillary refill normal to all digits.  No cyanosis or clubbing noted. Pedal hair growth normal.  Neurologic Normal speech. Oriented to person, place, and time. Protective sensation absent  Dermatologic Wound Location: Right transmetatarsal amputation site stump wound with fat layer exposed.  No purulent drainage noted does not probe down to bone. Wound Base: Mixed Granular/Fibrotic Peri-wound: Calloused Exudate: Scant/small amount Serosanguinous exudate Wound Measurements: -See above  Orthopedic: No pain to palpation either foot.   Radiographs: None Assessment:   No diagnosis found.      Plan:  Patient was evaluated and treated and all questions answered.  Ulcer right transmetatarsal amputation stump wound with fat layer exposed -Debridement as below. -Continue  dressed with Betadine wet-to-dry, DSD. -Continue off-loading with cam boot -At this time and clinically not concerning for infection as the wound is not probing down to bone appears to be very superficial without any tunneling.  No correlation with third fourth and fifth metatarsal bone per previous MRI. -Encouraged him to follow-up with his wound care center appointment  Procedure: Excisional Debridement of Wound~stagnant Tool: Sharp chisel blade/tissue nipper Rationale: Removal of non-viable soft tissue from the wound to promote healing.  Anesthesia: none Pre-Debridement Wound Measurements: 1 cm x 0.5 cm x 0.4 cm  Post-Debridement Wound Measurements: 1.2 cm x  0.6 cm x 0.4 cm  Type of Debridement: Sharp Excisional Tissue Removed: Non-viable soft tissue Blood loss: Minimal (<50cc) Depth of Debridement: subcutaneous tissue. Technique: Sharp excisional debridement to bleeding, viable wound base.  Wound Progress: Wound measurements are stagnant has not decreased Site healing conversation 7 Dressing: Dry, sterile, compression dressing. Disposition: Patient tolerated procedure well. Patient to return in 1 week for follow-up.  No follow-ups on file.

## 2023-02-22 NOTE — Telephone Encounter (Signed)
Called patient regarding results. Understands he will need to resume antibiotics. Will call pharmacy to get medication refilled. Will need MRI ordered by provider. Will forward message to Md. Juanita Laster, RMA

## 2023-02-27 ENCOUNTER — Other Ambulatory Visit: Payer: Self-pay | Admitting: Infectious Disease

## 2023-02-27 DIAGNOSIS — M86671 Other chronic osteomyelitis, right ankle and foot: Secondary | ICD-10-CM

## 2023-03-08 DIAGNOSIS — F419 Anxiety disorder, unspecified: Secondary | ICD-10-CM | POA: Diagnosis not present

## 2023-03-08 DIAGNOSIS — E663 Overweight: Secondary | ICD-10-CM | POA: Diagnosis not present

## 2023-03-08 DIAGNOSIS — G629 Polyneuropathy, unspecified: Secondary | ICD-10-CM | POA: Diagnosis not present

## 2023-03-08 DIAGNOSIS — E1159 Type 2 diabetes mellitus with other circulatory complications: Secondary | ICD-10-CM | POA: Diagnosis not present

## 2023-03-08 DIAGNOSIS — M150 Primary generalized (osteo)arthritis: Secondary | ICD-10-CM | POA: Diagnosis not present

## 2023-03-08 DIAGNOSIS — I10 Essential (primary) hypertension: Secondary | ICD-10-CM | POA: Diagnosis not present

## 2023-03-08 DIAGNOSIS — E1140 Type 2 diabetes mellitus with diabetic neuropathy, unspecified: Secondary | ICD-10-CM | POA: Diagnosis not present

## 2023-03-08 DIAGNOSIS — Z6827 Body mass index (BMI) 27.0-27.9, adult: Secondary | ICD-10-CM | POA: Diagnosis not present

## 2023-03-08 DIAGNOSIS — E1169 Type 2 diabetes mellitus with other specified complication: Secondary | ICD-10-CM | POA: Diagnosis not present

## 2023-03-08 DIAGNOSIS — G894 Chronic pain syndrome: Secondary | ICD-10-CM | POA: Diagnosis not present

## 2023-03-08 DIAGNOSIS — E1152 Type 2 diabetes mellitus with diabetic peripheral angiopathy with gangrene: Secondary | ICD-10-CM | POA: Diagnosis not present

## 2023-03-08 DIAGNOSIS — M86371 Chronic multifocal osteomyelitis, right ankle and foot: Secondary | ICD-10-CM | POA: Diagnosis not present

## 2023-03-10 ENCOUNTER — Ambulatory Visit (INDEPENDENT_AMBULATORY_CARE_PROVIDER_SITE_OTHER): Payer: No Typology Code available for payment source | Admitting: Podiatry

## 2023-03-10 DIAGNOSIS — Z89512 Acquired absence of left leg below knee: Secondary | ICD-10-CM

## 2023-03-10 DIAGNOSIS — L97512 Non-pressure chronic ulcer of other part of right foot with fat layer exposed: Secondary | ICD-10-CM | POA: Diagnosis not present

## 2023-03-10 DIAGNOSIS — Z794 Long term (current) use of insulin: Secondary | ICD-10-CM

## 2023-03-10 DIAGNOSIS — E1111 Type 2 diabetes mellitus with ketoacidosis with coma: Secondary | ICD-10-CM

## 2023-03-10 DIAGNOSIS — R2689 Other abnormalities of gait and mobility: Secondary | ICD-10-CM

## 2023-03-10 NOTE — Progress Notes (Signed)
Subjective:  Patient ID: Eugene Diaz, male    DOB: [birth year],  MRN: [id number]  No chief complaint on file.   57 y.o. male presents for wound care.  Patient presents with follow-up to right transmetatarsal amputation stump.  He states is doing okay.  Denies any other acute complaints wound is about the same.  He states he is missed his wound care center appointment.  He will try to get back in Review of Systems: Negative except as noted in the HPI. Denies N/V/F/Ch.  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) 05/23/2022   Depression    Diabetic neuropathy (HCC)    Edema of both lower extremities    Essential tremor    neurologist-- dr Tamber Burtch--- due to alcohol abuse   GERD (gastroesophageal reflux disease)    History of cellulitis 2020   left lower leg;   recurrent 03-25-2020   History of esophageal stricture    s/p  dilatation 02-13-2018   History of osteomyelitis 2020   left great toe     Hypercholesterolemia    Hypertension    followed by pcp  (nuclear stress test 03-11-2014 low risk w/ no ischemia, ef 65%   Normocytic anemia    followed by pcp   (03-18-2020 had transfusion's 02/ 2021)   Osteomyelitis of great toe of right foot (HCC) 08/18/2021   Peripheral vascular disease (HCC)    Severe depression (HCC) 06/09/2021   Status post incision and drainage followed by Dr Samuella Cota and pcp   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   (03-17-2020  pt states most of incision is healed with exception an area that is still draining,  daily dressing change),  foot is red but not warm to the touch and has swelling but has improved)   Suicidal ideation 06/09/2021   Syncope and collapse 06/07/2022   Syncope, near 06/20/2022   Thrombocytopenia (HCC)    chronic   Type 2 diabetes  mellitus (HCC)    followed by pcp---    (03-18-2020  pt stated does not check blood sugar)   Vaccine counseling 08/29/2022    Current Outpatient Medications:    acetaminophen (TYLENOL) 325 MG tablet, Take 2 tablets (650 mg total) by mouth every 6 (six) hours as needed for mild pain., Disp: , Rfl:    albuterol (VENTOLIN HFA) 108 (90 Base) MCG/ACT inhaler, Inhale 1-2 puffs into the lungs every 4 (four) hours as needed for wheezing or shortness of breath., Disp: , Rfl:    ALPRAZolam (XANAX) 1 MG tablet, Take 1 mg by mouth 4 (four) times daily., Disp: , Rfl:    amitriptyline (ELAVIL) 100 MG tablet, Take 100 mg by mouth at bedtime., Disp: , Rfl:    amLODipine (NORVASC) 10 MG tablet, Take 1 tablet (10 mg total) by mouth daily., Disp: 90 tablet, Rfl: 3   amoxicillin-clavulanate (AUGMENTIN) 875-125 MG tablet, Take 1 tablet by mouth 2 (two) times daily., Disp: 60 tablet, Rfl: 5   canagliflozin (INVOKANA) 300 MG TABS tablet, , Disp: , Rfl:    cefdinir (OMNICEF) 300 MG capsule, Take 300 mg by mouth 2 (two) times daily., Disp: , Rfl:    diclofenac Sodium (VOLTAREN) 1 % GEL, Apply 4 g topically  4 (four) times daily as needed (Apply to painful area over left upper front chest.)., Disp: 50 g, Rfl: 0   doxycycline (VIBRA-TABS) 100 MG tablet, Take 1 tablet (100 mg total) by mouth 2 (two) times daily., Disp: 60 tablet, Rfl: 5   folic acid (FOLVITE) 1 MG tablet, Take 1 tablet (1 mg total) by mouth daily., Disp: 30 tablet, Rfl: 0   gabapentin (NEURONTIN) 300 MG capsule, Take by mouth., Disp: , Rfl:    lisinopril (ZESTRIL) 10 MG tablet, Take 1 tablet (10 mg total) by mouth daily., Disp: 90 tablet, Rfl: 3   metFORMIN (GLUCOPHAGE) 500 MG tablet, Take 500 mg by mouth 2 (two) times daily., Disp: , Rfl:    morphine (MS CONTIN) 30 MG 12 hr tablet, Take 30 mg by mouth every 12 (twelve) hours., Disp: , Rfl:    Multiple Vitamin (MULTIVITAMIN WITH MINERALS) TABS tablet, Take 1 tablet by mouth daily., Disp: , Rfl:     oxycodone (ROXICODONE) 30 MG immediate release tablet, Take 30 mg by mouth 3 (three) times daily., Disp: , Rfl:    rosuvastatin (CRESTOR) 20 MG tablet, Take 1 tablet (20 mg total) by mouth daily., Disp: 90 tablet, Rfl: 3   thiamine 100 MG tablet, Take 1 tablet (100 mg total) by mouth daily., Disp: 30 tablet, Rfl: 0  Social History   Tobacco Use  Smoking Status Some Days   Packs/day: 2.50   Years: 25.00   Additional pack years: 0.00   Total pack years: 62.50   Types: Cigarettes, Cigars   Last attempt to quit: 08/05/1984   Years since quitting: 38.6  Smokeless Tobacco Never  Tobacco Comments   Quit smoking cigarettes; now smokes cigars, wants to quit    No Known Allergies Objective:   There were no vitals filed for this visit.  There is no height or weight on file to calculate BMI. Constitutional Well developed. Well nourished.  Vascular Dorsalis pedis pulses palpable bilaterally. Posterior tibial pulses palpable bilaterally. Capillary refill normal to all digits.  No cyanosis or clubbing noted. Pedal hair growth normal.  Neurologic Normal speech. Oriented to person, place, and time. Protective sensation absent  Dermatologic Wound Location: Right transmetatarsal amputation site stump wound with fat layer exposed.  No purulent drainage noted does not probe down to bone. Wound Base: Mixed Granular/Fibrotic Peri-wound: Calloused Exudate: Scant/small amount Serosanguinous exudate Wound Measurements: -See above  Orthopedic: No pain to palpation either foot.   Radiographs: None Assessment:   1. Ulcer of right foot with fat layer exposed (HCC)   2. Type 2 diabetes mellitus with ketoacidosis and coma, with long-term current use of insulin (HCC)   3. Antalgic gait   4. History of below knee amputation, left (HCC)         Plan:  Patient was evaluated and treated and all questions answered.  Ulcer right transmetatarsal amputation stump wound with fat layer  exposed -Debridement as below. -Continue dressed with Betadine wet-to-dry, DSD. -Continue off-loading with cam boot -At this time and clinically not concerning for infection as the wound is not probing down to bone appears to be very superficial without any tunneling.  No correlation with third fourth and fifth metatarsal bone per previous MRI. 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  Procedure: Excisional Debridement of Wound~stagnant Tool: Sharp chisel blade/tissue nipper Rationale: Removal of non-viable soft tissue from  the wound to promote healing.  Anesthesia: none Pre-Debridement Wound Measurements: 1 cm x 0.5 cm x 0.4 cm  Post-Debridement Wound Measurements: 1.2 cm x 0.6 cm x 0.4 cm  Type of Debridement: Sharp Excisional Tissue Removed: Non-viable soft tissue Blood loss: Minimal (<50cc) Depth of Debridement: subcutaneous tissue. Technique: Sharp excisional debridement to bleeding, viable wound base.  Wound Progress: Wound measurements are stagnant has not decreased Site healing conversation 7 Dressing: Dry, sterile, compression dressing. Disposition: Patient tolerated procedure well. Patient to return in 1 week for follow-up.  No follow-ups on file.

## 2023-03-20 ENCOUNTER — Ambulatory Visit (HOSPITAL_BASED_OUTPATIENT_CLINIC_OR_DEPARTMENT_OTHER): Payer: No Typology Code available for payment source | Admitting: Cardiovascular Disease

## 2023-03-20 ENCOUNTER — Encounter (HOSPITAL_BASED_OUTPATIENT_CLINIC_OR_DEPARTMENT_OTHER): Payer: Self-pay | Admitting: Cardiovascular Disease

## 2023-03-20 VITALS — BP 118/72 | HR 85 | Ht 74.0 in | Wt 240.0 lb

## 2023-03-20 DIAGNOSIS — Z7984 Long term (current) use of oral hypoglycemic drugs: Secondary | ICD-10-CM

## 2023-03-20 DIAGNOSIS — E785 Hyperlipidemia, unspecified: Secondary | ICD-10-CM | POA: Diagnosis not present

## 2023-03-20 DIAGNOSIS — I25118 Atherosclerotic heart disease of native coronary artery with other forms of angina pectoris: Secondary | ICD-10-CM

## 2023-03-20 DIAGNOSIS — I2510 Atherosclerotic heart disease of native coronary artery without angina pectoris: Secondary | ICD-10-CM | POA: Diagnosis not present

## 2023-03-20 DIAGNOSIS — I10 Essential (primary) hypertension: Secondary | ICD-10-CM

## 2023-03-20 DIAGNOSIS — E11621 Type 2 diabetes mellitus with foot ulcer: Secondary | ICD-10-CM

## 2023-03-20 DIAGNOSIS — Z5181 Encounter for therapeutic drug level monitoring: Secondary | ICD-10-CM

## 2023-03-20 DIAGNOSIS — L97415 Non-pressure chronic ulcer of right heel and midfoot with muscle involvement without evidence of necrosis: Secondary | ICD-10-CM

## 2023-03-20 MED ORDER — ROSUVASTATIN CALCIUM 20 MG PO TABS
20.0000 mg | ORAL_TABLET | Freq: Every day | ORAL | 3 refills | Status: DC
Start: 2023-03-20 — End: 2024-01-26

## 2023-03-20 NOTE — Patient Instructions (Signed)
Medication Instructions:  INCREASE YOUR ROSUVASTATIN TO 20 MG DAILY   *If you need a refill on your cardiac medications before your next appointment, please call your pharmacy*  Lab Work: FASTING LP/CMET 2-3 MONTHS   If you have labs (blood work) drawn today and your tests are completely normal, you will receive your results only by: MyChart Message (if you have MyChart) OR A paper copy in the mail If you have any lab test that is abnormal or we need to change your treatment, we will call you to review the results.  Testing/Procedures: NONE  Follow-Up: At [facility], you and your health needs are our priority.  As part of our continuing mission to provide you with exceptional heart care, we have created designated Provider Care Teams.  These Care Teams include your primary Cardiologist (physician) and Advanced Practice Providers (APPs -  Physician Assistants and Nurse Practitioners) who all work together to provide you with the care you need, when you need it.  We recommend signing up for the patient portal called "MyChart".  Sign up information is provided on this After Visit Summary.  MyChart is used to connect with patients for Virtual Visits (Telemedicine).  Patients are able to view lab/test results, encounter notes, upcoming appointments, etc.  Non-urgent messages can be sent to your provider as well.   To learn more about what you can do with MyChart, go to [email or web address].    Your next appointment:   6 month(s)  Provider:   Chilton Si, MD    Other Instructions  REALLY NEED TO TRY TO QUIT SMOKING

## 2023-03-20 NOTE — Progress Notes (Signed)
Cardiology Office Note:    Date:  03/20/2023   ID:  Eugene Diaz, DOB 10-24-1966, MRN [id number]  PCP:  Elfredia Nevins, MD   [facility] Providers Cardiologist:  Chilton Si, MD     Referring MD: Elfredia Nevins, MD   No chief complaint on file.   History of Present Illness:    Eugene Diaz is a 57 y.o. male with a hx of diabetes, hypertension, hyperlipidemia, tobacco abuse, alcohol abuse, PAD s/p left BKA and right transmetatarsal amputation here for follow up. He was seen in the hospital 02/2022 with chest pain. Coronary CTA revealed mild disease in the LAD, LCX, and RCA. He was seen in the ED yesterday due to an unresponsive episode. He was found down on his porch and there was some concern for narcotic overdose, however in the ED he did not have any opioids on his tox screen. He was positive for THC. He was negative for alcohol. Head CT was unremarkable other than chronic microvascular disease.   Eugene Diaz had an episode of near syncope.  He wore a 14-day monitor that showed runs of SVT.  He was also doing physical therapy for his right leg.  He had restarted smoking.  He came for fasting lipids which showed that his LDL was not at goal and it was recommended that he increase rosuvastatin.  This was 08/2022 he was feeling well.  He was healing from toe amputation and had an ulcer that was healing slowly.  He is using a wound VAC and seeing Dr. Allena Katz for debridement and dressings.  Blood pressure is uncontrolled and lisinopril was resumed.  He was admitted 09/2022 for an infected diabetic wound in the foot.  Right ABIs were normal.  Podiatry recommended on surgical interventions.  He saw Gillian Shields, NP 10/2022 and was feeling well.  Eugene Diaz reports ongoing issues with a significant wound on his foot, describing it as having "a big hole" and expressing frustration over its lack of improvement despite applying medication. He stopped antibiotics but was instructed  to resume them.  He is scheduled for an MRI later in the week to further assess the condition.  He states that the foot pain significantly limits his mobility and ability to exercise. He also mentions using an inhaler for breathing issues, which provides minimal relief.  Eugene Diaz discusses his smoking habits, currently smoking just over a pack a day. He recalls quitting once for 30 years through sheer willpower but has since resumed. He expresses a reluctant interest in quitting again, acknowledging the negative impact of smoking on his wound healing, though he prefers to quit without aids like patches.  He shares that his limited mobility and pain prevent him from engaging in preferred activities like fishing, and he dislikes watching TV, feeling frustrated by his reduced activity levels due to his health issues. Eugene Diaz also touches on his social isolation and challenges with meal provision, mentioning past use of Meals on Wheels. His dietary intake appears affected, as indicated by his weight loss, which he attributes to living alone and not having a wife.   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) 05/23/2022   Depression    Diabetic  neuropathy (HCC)    Edema of both lower extremities    Essential tremor    neurologist-- dr patel--- due to alcohol abuse   GERD (gastroesophageal reflux disease)    History of cellulitis 2020   left lower leg;   recurrent 03-25-2020   History of esophageal stricture    s/p  dilatation 02-13-2018   History of osteomyelitis 2020   left great toe     Hypercholesterolemia    Hypertension    followed by pcp  (nuclear stress test 03-11-2014 low risk w/ no ischemia, ef 65%   Normocytic anemia    followed by pcp   (03-18-2020 had transfusion's  02/ 2021)   Osteomyelitis of great toe of right foot (HCC) 08/18/2021   Peripheral vascular disease (HCC)    Severe depression (HCC) 06/09/2021   Status post incision and drainage followed by Dr Samuella Cota and pcp   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   (03-17-2020  pt states most of incision is healed with exception an area that is still draining,  daily dressing change),  foot is red but not warm to the touch and has swelling but has improved)   Suicidal ideation 06/09/2021   Syncope and collapse 06/07/2022   Syncope, near 06/20/2022   Thrombocytopenia (HCC)    chronic   Type 2 diabetes mellitus (HCC)    followed by pcp---    (03-18-2020  pt stated does not check blood sugar)   Vaccine counseling 08/29/2022    Past Surgical History:  Procedure Laterality Date   AMPUTATION Left 10/16/2019   Procedure: Left partial second ray resection; placement of antibiotic beads;  Surgeon: Park Liter, DPM;  Location: [facility];  Service: Podiatry;  Laterality: Left;   AMPUTATION Left 11/13/2019   Procedure: Left Midfoot Amputation - Transmetatarsal vs. Lisfranc; Placement antibiotic beads;  Surgeon: Park Liter, DPM;  Location: [facility];  Service: Podiatry;  Laterality: Left;   AMPUTATION Left 12/21/2019   Procedure: Chopart Amputation left foot;  Surgeon: Park Liter, DPM;  [facility]: [facility];  Service: Podiatry;  Laterality: Left;   AMPUTATION Left 06/24/2020   Procedure: LEFT BELOW KNEE AMPUTATION;  Surgeon: Nadara Mustard, MD;  Location: [facility] OR;  Service: Orthopedics;  Laterality: Left;   AMPUTATION Right 03/29/2021   Procedure: AMPUTATION RAY;  Surgeon: Park Liter, DPM;  Location: [facility];  Service: Podiatry;  Laterality: Right;   AMPUTATION TOE Left 08/14/2019   Procedure: AMPUTATION GREAT TOE;  Surgeon: Park Liter, DPM;  [facility]: [facility];  Service: Podiatry;  Laterality: Left;   APPLICATION OF WOUND VAC Left 12/21/2019   Procedure: Application Of Wound Vac;  Surgeon: Park Liter, DPM;  [facility];  Service: Podiatry;  Laterality: Left;   BIOPSY  02/13/2018   Procedure: BIOPSY;  Surgeon: West Bali, MD;  [facility];  Service: Endoscopy;;  transverse colon biopsy, gastric biopsy   COLONOSCOPY WITH PROPOFOL N/A 02/13/2018   Procedure: COLONOSCOPY WITH PROPOFOL;  Surgeon: West Bali, MD;  [facility];  Service: Endoscopy;  Laterality: N/A;  11:15am   ESOPHAGOGASTRODUODENOSCOPY (EGD) WITH PROPOFOL N/A 02/13/2018   Procedure: ESOPHAGOGASTRODUODENOSCOPY (EGD) WITH PROPOFOL;  Surgeon: West Bali, MD;  [facility];  Service: Endoscopy;  Laterality: N/A;   I & D EXTREMITY Left 07/16/2019   Procedure: Insicion  AND DEBRIDEMENT EXTREMITY;  Surgeon: Park Liter, DPM;  [facility];  Service: Podiatry;  Laterality: Left;  I & D EXTREMITY Left 06/22/2020   Procedure: IRRIGATION AND DEBRIDEMENT EXTREMITY, left foot and ankle;  Surgeon: Park Liter, DPM;  [facility];  Service: Podiatry;  Laterality: Left;   I & D EXTREMITY Right 03/29/2021   Procedure: IRRIGATION AND DEBRIDEMENT EXTREMITY;  Surgeon: Park Liter, DPM;  [facility];  Service: Podiatry;  Laterality: Right;   INCISION AND DRAINAGE OF WOUND Left 10/13/2019   Procedure: IRRIGATION AND DEBRIDEMENT WOUND OF LEFT FOOT AND FIRST METATARSAL RESECTION;  Surgeon: Park Liter, DPM;  [facility];  Service: Podiatry;  Laterality: Left;   IRRIGATION AND DEBRIDEMENT FOOT Left 11/11/2019   Procedure: Left Foot Wound Irrigation and Debridement;  Surgeon: Park Liter, DPM;  [facility]: [facility];  Service: Podiatry;  Laterality: Left;   Left arm     Left arm repair (tendon and artery)   PERCUTANEOUS PINNING  07/16/2019   Procedure: Open Reduction Percutaneous Pinning Extremity;  Surgeon: Park Liter, DPM;  [facility];  Service: Podiatry;;   PILONIDAL CYST EXCISION N/A 08/08/2014   Procedure: CYST EXCISION PILONIDAL EXTENSIVE;  Surgeon: Dalia Heading, MD;  [facility]: [facility];  Service: General;  Laterality: N/A;   POLYPECTOMY  02/13/2018   Procedure: POLYPECTOMY;  Surgeon: West Bali, MD;  [facility];  Service: Endoscopy;;  transverse colon polyp hs, rectal polyps times 2   SAVORY DILATION N/A 02/13/2018   Procedure: SAVORY DILATION;  Surgeon: West Bali, MD;  [facility];  Service: Endoscopy;  Laterality: N/A;   TENDON LENGTHENING Right 12/04/2021   Procedure: TENDON LENGTHENING;  Surgeon: Asencion Islam, DPM;  [facility];  Service: Podiatry;  Laterality: Right;   TRANSMETATARSAL AMPUTATION Right 12/04/2021   Procedure: TRANSMETATARSAL AMPUTATION;  Surgeon: Asencion Islam, DPM;  [facility];  Service: Podiatry;  Laterality: Right;   WOUND DEBRIDEMENT Left 09/04/2019   Procedure: DEBRIDEMENT WOUND WITH COMPLEX  REPAIR OF DEHISCENCE;  Surgeon: Park Liter, DPM;  Location: [facility];  Service: Podiatry;  Laterality: Left;  Leave patient on stretcher   WOUND DEBRIDEMENT Left 10/16/2019   Procedure: Left foot wound debridement and closure;  Surgeon: Park Liter, DPM;  Location: [facility];  Service: Podiatry;  Laterality: Left;   WOUND DEBRIDEMENT Left 12/18/2019   Procedure: LEFT FOOT DEBRIDEMENT WITH PARTIAL INCISION OF INFECTED BONE;  Surgeon: Park Liter, DPM;  [facility];  Service: Podiatry;  Laterality: Left;  LEFT FOOT DEBRIDEMENT WITH PARTIAL INCISION OF INFECTED BONE   WOUND DEBRIDEMENT Right 04/01/2021   Procedure: Right foot wound debridement and irrigation and closure, bone biopsy;  Surgeon: Park Liter, DPM;  [facility];  Service: Podiatry;  Laterality: Right;    Current Medications: Current Meds  Medication Sig   acetaminophen (TYLENOL) 325 MG tablet Take 2 tablets (650 mg total) by mouth every 6 (six) hours as needed for mild pain.   albuterol (VENTOLIN HFA) 108 (90 Base) MCG/ACT inhaler Inhale 1-2 puffs into the lungs every 4 (four) hours as  needed for wheezing or shortness of breath.   ALPRAZolam (XANAX) 1 MG tablet Take 1 mg by mouth 4 (four) times daily.   amitriptyline (ELAVIL) 100 MG tablet Take 100 mg by mouth at bedtime.   amLODipine (NORVASC) 10 MG tablet Take 1 tablet (10 mg total) by mouth daily.   amoxicillin-clavulanate (AUGMENTIN) 875-125 MG tablet Take 1 tablet by mouth 2 (two) times daily.   canagliflozin (INVOKANA) 300  MG TABS tablet    cefdinir (OMNICEF) 300 MG capsule Take 300 mg by mouth 2 (two) times daily.   diclofenac Sodium (VOLTAREN) 1 % GEL Apply 4 g topically 4 (four) times daily as needed (Apply to painful area over left upper front chest.).   doxycycline (VIBRA-TABS) 100 MG tablet Take 1 tablet (100 mg total) by mouth 2 (two) times daily.   folic acid (FOLVITE) 1 MG tablet Take 1 tablet (1 mg total) by mouth daily.   gabapentin (NEURONTIN) 300 MG capsule Take by mouth.   lisinopril (ZESTRIL) 10 MG tablet Take 1 tablet (10 mg total) by mouth daily.   metFORMIN (GLUCOPHAGE) 500 MG tablet Take 500 mg by mouth 2 (two) times daily.   morphine (MS CONTIN) 30 MG 12 hr tablet Take 30 mg by mouth every 12 (twelve) hours.   Multiple Vitamin (MULTIVITAMIN WITH MINERALS) TABS tablet Take 1 tablet by mouth daily.   oxycodone (ROXICODONE) 30 MG immediate release tablet Take 30 mg by mouth 3 (three) times daily.   thiamine 100 MG tablet Take 1 tablet (100 mg total) by mouth daily.   [DISCONTINUED] rosuvastatin (CRESTOR) 10 MG tablet Take 1 tablet (10 mg total) by mouth daily.     Allergies:   Patient has no known allergies.   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  Tobacco Use   Smoking status: Some Days    Packs/day: 2.50    Years: 25.00    Additional pack  years: 0.00    Total pack years: 62.50    Types: Cigarettes, Cigars    Last attempt to quit: 08/05/1984    Years since quitting: 38.6   Smokeless tobacco: Never   Tobacco comments:    Quit smoking cigarettes; now smokes cigars, wants to quit  Vaping Use   Vaping Use: Never used  Substance and Sexual Activity   Alcohol use: Yes    Alcohol/week: 50.0 standard drinks of alcohol    Types: 50 Standard drinks or equivalent per week    Comment: Drinks 6 beers, liquor 4-6oz (both daily) x 35 years; now states he "drinks a few beers and let it go"   Drug use: Not Currently    Types: Marijuana   Sexual activity: Yes    Birth control/protection: None  Other Topics Concern   Not on file  Social History Narrative   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 Highest level of education:  8th grade   He lives with wife.  No children.   Left handed   Drinks soda, water-no coffee, no tea   Social Determinants of Health   Financial Resource Strain: Not on file  Food Insecurity: No Food Insecurity (10/13/2022)   Hunger Vital Sign    Worried About Running Out of Food in the Last Year: Never true    Ran Out of Food in the Last Year: Never true  Transportation Needs: No Transportation Needs (10/13/2022)   PRAPARE - Administrator, Civil Service (Medical): No    Lack of Transportation (Non-Medical): No  Physical Activity: Not on file  Stress: Not on file  Social Connections: Not on file     Family History:  The patient's family history includes Cancer in his father; Healthy in his brother and sister; Heart attack in his mother. There is no history of Colon cancer or Colon polyps.  ROS:   Please see the history of present illness.    (+) Near syncope (+) Chest discomfort  (+) Right arm/hand weakness  All other systems reviewed and are negative.  EKGs/Labs/Other Studies Reviewed:    The following studies were reviewed today:  12 Day Zio  Monitor 07/2022:   Quality: Fair.  Baseline artifact. Predominant rhythm: sinus rhythm      Average heart rate: 77 bpm Max heart rate: 127 bpm Min heart rate: 49 bpm Pauses >2.5 seconds: none   6 beats NSVT 10 runs of SVT, up to 9 beats at a time    Echo 03/03/2022: 1. Apex is hypokinetic. Left ventricular ejection fraction, by  estimation, is 50 to 55%. The left ventricle has low normal function.  There is moderate left ventricular hypertrophy. Left ventricular diastolic  parameters are consistent with Grade II  diastolic dysfunction (pseudonormalization).   2. Right ventricular systolic function is normal. The right ventricular  size is normal.   3. The mitral valve was not well visualized. No evidence of mitral valve  regurgitation.   4. There is mild calcification of the aortic valve. Aortic valve  regurgitation is not visualized.   5. The inferior vena cava is normal in size with greater than 50%  respiratory variability, suggesting right atrial pressure of 3 mmHg.    CT Coronary Morph 03/02/2022: FINDINGS: Non-cardiac: See separate report from [facility].   Pulmonary veins drain normally to the left atrium. No LA appendage thrombus.   Calcium Score: 94 Agatston units.   Coronary Arteries: Right dominant with no anomalies   LM: No plaque or stenosis.   LAD system: Mixed plaque ostial LAD, mild (1-24%) stenosis. Large D1 with no significant disease.   Circumflex system: Mixed plaque mid LCx, mild (1-24%) stenosis.   RCA system: Mixed plaque proximal and mid RCA, mild (1-24%) stenosis.   IMPRESSION: 1. Coronary artery calcium score 94 Agatston units. This places the patient in the 74th percentile for age and gender, suggesting intermediate risk for future cardiac events.   2.  Nonobstructive mild CAD.     EKG: EKG is personally reviewed.  06/07/22: EKG was not ordered.    Recent Labs: 10/13/2022: Magnesium 1.8 02/20/2023: ALT 6; BUN 11; Creat 0.78;  Hemoglobin 13.4; Platelets 291; Potassium 4.2; Sodium 137  Recent Lipid Panel    Component Value Date/Time   CHOL 140 08/31/2022 1052   TRIG 134 08/31/2022 1052   HDL 31 (L) 08/31/2022 1052   CHOLHDL 4.5 08/31/2022 1052   CHOLHDL 4.5 05/24/2022 0534   VLDL 17 05/24/2022 0534   LDLCALC 85 08/31/2022 1052     Risk Assessment/Calculations:           Physical Exam:    VS:  BP 118/72 (BP Location: Right Arm, Patient Position: Sitting, Cuff Size: Normal)   Pulse 85   Ht 6\' 2"  (1.88 m)   Wt 240 lb (108.9 kg)   SpO2 98%   BMI 30.81 kg/m     Wt Readings from Last 3 Encounters:  03/20/23 240 lb (108.9 kg)  11/21/22 229 lb (103.9 kg)  11/08/22 232 lb 3.2 oz (105.3 kg)    VS:  BP 118/72 (BP Location: Right Arm, Patient Position: Sitting, Cuff Size: Normal)   Pulse 85   Ht 6\' 2"  (1.88 m)  Wt 240 lb (108.9 kg)   SpO2 98%   BMI 30.81 kg/m  , BMI Body mass index is 30.81 kg/m. GENERAL:  Well appearing.  No acute distress.  HEENT: Pupils equal round and reactive, fundi not visualized, oral mucosa unremarkable NECK:  No jugular venous distention, waveform within normal limits, carotid upstroke brisk and symmetric, no bruits, no thyromegaly LUNGS:  Clear to auscultation bilaterally HEART:  RRR.  PMI not displaced or sustained,S1 and S2 within normal limits, no S3, no S4, no clicks, no rubs, no murmurs ABD:  Flat, positive bowel sounds normal in frequency in pitch, no bruits, no rebound, no guarding, no midline pulsatile mass, no hepatomegaly, no splenomegaly EXT:  Left BKA s/p prosthesis, Right LE boot in place SKIN:  No rashes no nodules NEURO:  Cranial nerves II through XII grossly intact, motor grossly intact throughout PSYCH:  Cognitively intact, oriented to person place and time    ASSESSMENT:    1. CAD in native artery   2. Hyperlipidemia LDL goal <70   3. Coronary artery disease of native artery of native heart with stable angina pectoris (HCC)   4. Essential  hypertension   5. Diabetic ulcer of right midfoot associated with type 2 diabetes mellitus, with muscle involvement without evidence of necrosis (HCC)   6. Therapeutic drug monitoring      PLAN:    # CAD: # Hyperlipidemia: Non-obstructive disease on coronary CT-A.  He has no ischemic symptoms.   - LDL at 83, target is under 70. - Increase rosuvastatin (Crestor) dosage to 20 mg. - Recheck lipid panel in a few months with fasting labs.  # Chronic non-healing foot ulcer: - Patient reports continued pain and lack of improvement despite using prescribed medication. - Continue current wound care regimen. - Encourage patient to quit smoking to improve wound healing. - Scheduled MRI for May 24 as recommended by infectious disease specialists.  Antibiotic use: - Patient reports conflicting instructions from different doctors. - Confirm with infectious disease specialists whether to continue or discontinue antibiotics.  # COPD:  # Dyspnea: - Patient continues to use inhaler with some relief. - Monitor breathing and adjust treatment as needed.  # Tobacco use: - Patient currently smokes over a pack a day. - Encourage patient to quit smoking, emphasizing the benefits for wound healing and overall health. - Discuss potential cessation methods, respecting patient's preference for quitting cold [place].  # Hypertension: - Blood pressure well-controlled on amlodipine and lisinopril. - Continue current medications.  # Social support and nutrition: - Patient reports lack of appetite and social support. - Explore options for meal assistance, such as Meals on Wheels.  He is not interested at this time.  - Encourage patient to seek social support and engage in activities they enjoy.  Follow-up: - Schedule follow-up appointment to monitor progress and reevaluate treatment plans. - Provide directions for [place] office for lab work.    Follow up:  FU with Shermeka Rutt C. Duke Salvia, MD, [facility] in  6 months  Medication Adjustments/Labs and Tests Ordered: Current medicines are reviewed at length with the patient today.  Concerns regarding medicines are outlined above.  Orders Placed This Encounter  Procedures   Lipid panel   Comprehensive metabolic panel   Meds ordered this encounter  Medications   rosuvastatin (CRESTOR) 20 MG tablet    Sig: Take 1 tablet (20 mg total) by mouth daily.    Dispense:  90 tablet    Refill:  3    INCREASED  DOSE    Patient Instructions  Medication Instructions:  INCREASE YOUR ROSUVASTATIN TO 20 MG DAILY   *If you need a refill on your cardiac medications before your next appointment, please call your pharmacy*  Lab Work: FASTING LP/CMET 2-3 MONTHS   If you have labs (blood work) drawn today and your tests are completely normal, you will receive your results only by: MyChart Message (if you have MyChart) OR A paper copy in the mail If you have any lab test that is abnormal or we need to change your treatment, we will call you to review the results.  Testing/Procedures: NONE  Follow-Up: At [facility], you and your health needs are our priority.  As part of our continuing mission to provide you with exceptional heart care, we have created designated Provider Care Teams.  These Care Teams include your primary Cardiologist (physician) and Advanced Practice Providers (APPs -  Physician Assistants and Nurse Practitioners) who all work together to provide you with the care you need, when you need it.  We recommend signing up for the patient portal called "MyChart".  Sign up information is provided on this After Visit Summary.  MyChart is used to connect with patients for Virtual Visits (Telemedicine).  Patients are able to view lab/test results, encounter notes, upcoming appointments, etc.  Non-urgent messages can be sent to your provider as well.   To learn more about what you can do with MyChart, go to [email or web address].    Your  next appointment:   6 month(s)  Provider:   Chilton Si, MD    Other Instructions  REALLY NEED TO TRY TO QUIT SMOKING      Signed, Chilton Si, MD  03/20/2023 10:09 AM    [facility]

## 2023-03-21 ENCOUNTER — Telehealth: Payer: Self-pay | Admitting: *Deleted

## 2023-03-21 NOTE — Telephone Encounter (Signed)
Faxed order to [facility] in [place] for an Stage [employer] supply stores in [place] do not carry, patient may have to pay out of pocket,hard to get approved thru insurance. Patient has been updated.

## 2023-03-21 NOTE — Telephone Encounter (Signed)
-----   Message from Candelaria Stagers, DPM sent at 03/21/2023  1:11 PM EDT ----- Regarding: Electric wheelchair Hi Audrick Lamoureaux,   Can you order electric wheelchair for this patient.  I have placed the order.  Thank you

## 2023-03-24 ENCOUNTER — Ambulatory Visit (HOSPITAL_COMMUNITY): Payer: No Typology Code available for payment source

## 2023-04-03 ENCOUNTER — Ambulatory Visit (HOSPITAL_BASED_OUTPATIENT_CLINIC_OR_DEPARTMENT_OTHER): Payer: Medicare HMO | Admitting: General Surgery

## 2023-04-07 DIAGNOSIS — M150 Primary generalized (osteo)arthritis: Secondary | ICD-10-CM | POA: Diagnosis not present

## 2023-04-07 DIAGNOSIS — F419 Anxiety disorder, unspecified: Secondary | ICD-10-CM | POA: Diagnosis not present

## 2023-04-07 DIAGNOSIS — I10 Essential (primary) hypertension: Secondary | ICD-10-CM | POA: Diagnosis not present

## 2023-04-07 DIAGNOSIS — E1140 Type 2 diabetes mellitus with diabetic neuropathy, unspecified: Secondary | ICD-10-CM | POA: Diagnosis not present

## 2023-04-07 DIAGNOSIS — E1159 Type 2 diabetes mellitus with other circulatory complications: Secondary | ICD-10-CM | POA: Diagnosis not present

## 2023-04-07 DIAGNOSIS — E1165 Type 2 diabetes mellitus with hyperglycemia: Secondary | ICD-10-CM | POA: Diagnosis not present

## 2023-04-07 DIAGNOSIS — G629 Polyneuropathy, unspecified: Secondary | ICD-10-CM | POA: Diagnosis not present

## 2023-04-07 DIAGNOSIS — M86371 Chronic multifocal osteomyelitis, right ankle and foot: Secondary | ICD-10-CM | POA: Diagnosis not present

## 2023-04-07 DIAGNOSIS — G894 Chronic pain syndrome: Secondary | ICD-10-CM | POA: Diagnosis not present

## 2023-04-19 ENCOUNTER — Ambulatory Visit (INDEPENDENT_AMBULATORY_CARE_PROVIDER_SITE_OTHER): Payer: Medicare HMO | Admitting: Podiatry

## 2023-04-19 DIAGNOSIS — Z794 Long term (current) use of insulin: Secondary | ICD-10-CM

## 2023-04-19 DIAGNOSIS — L97512 Non-pressure chronic ulcer of other part of right foot with fat layer exposed: Secondary | ICD-10-CM | POA: Diagnosis not present

## 2023-04-19 DIAGNOSIS — E1111 Type 2 diabetes mellitus with ketoacidosis with coma: Secondary | ICD-10-CM

## 2023-04-19 NOTE — Progress Notes (Signed)
Subjective:  Patient ID: Eugene Diaz, male    DOB: September 29, 1966,  MRN: [id number]  Chief Complaint  Patient presents with   Foot Ulcer     Right foot ulcer f/u    57 y.o. male presents for wound care.  Patient presents with follow-up to right transmetatarsal amputation stump.  He states is doing okay.  Denies any other acute complaints wound is about the same.  He states he is missed his wound care center appointment.  He will try to get back in Review of Systems: Negative except as noted in the HPI. Denies N/V/F/Ch.  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) 05/23/2022   Depression    Diabetic neuropathy (HCC)    Edema of both lower extremities    Essential tremor    neurologist-- dr Yoltzin Barg--- due to alcohol abuse   GERD (gastroesophageal reflux disease)    History of cellulitis 2020   left lower leg;   recurrent 03-25-2020   History of esophageal stricture    s/p  dilatation 02-13-2018   History of osteomyelitis 2020   left great toe     Hypercholesterolemia    Hypertension    followed by pcp  (nuclear stress test 03-11-2014 low risk w/ no ischemia, ef 65%   Normocytic anemia    followed by pcp   (03-18-2020 had transfusion's 02/ 2021)   Osteomyelitis of great toe of right foot (HCC) 08/18/2021   Peripheral vascular disease (HCC)    Severe depression (HCC) 06/09/2021   Status post incision and drainage followed by Dr Samuella Cota and pcp   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   (03-17-2020  pt states most of incision is healed with exception an area that is still draining,  daily dressing change),  foot is red but not warm to the touch and has swelling but has improved)   Suicidal ideation 06/09/2021   Syncope and collapse 06/07/2022   Syncope, near 06/20/2022    Thrombocytopenia (HCC)    chronic   Type 2 diabetes mellitus (HCC)    followed by pcp---    (03-18-2020  pt stated does not check blood sugar)   Vaccine counseling 08/29/2022    Current Outpatient Medications:    acetaminophen (TYLENOL) 325 MG tablet, Take 2 tablets (650 mg total) by mouth every 6 (six) hours as needed for mild pain., Disp: , Rfl:    albuterol (VENTOLIN HFA) 108 (90 Base) MCG/ACT inhaler, Inhale 1-2 puffs into the lungs every 4 (four) hours as needed for wheezing or shortness of breath., Disp: , Rfl:    ALPRAZolam (XANAX) 1 MG tablet, Take 1 mg by mouth 4 (four) times daily., Disp: , Rfl:    amitriptyline (ELAVIL) 100 MG tablet, Take 100 mg by mouth at bedtime., Disp: , Rfl:    amLODipine (NORVASC) 10 MG tablet, Take 1 tablet (10 mg total) by mouth daily., Disp: 90 tablet, Rfl: 3   amoxicillin-clavulanate (AUGMENTIN) 875-125 MG tablet, Take 1 tablet by mouth 2 (two) times daily., Disp: 60 tablet, Rfl: 5   canagliflozin (INVOKANA) 300 MG TABS tablet, , Disp: , Rfl:    cefdinir (OMNICEF) 300 MG capsule, Take 300 mg by mouth 2 (two) times daily., Disp: ,  Rfl:    diclofenac Sodium (VOLTAREN) 1 % GEL, Apply 4 g topically 4 (four) times daily as needed (Apply to painful area over left upper front chest.)., Disp: 50 g, Rfl: 0   doxycycline (VIBRA-TABS) 100 MG tablet, Take 1 tablet (100 mg total) by mouth 2 (two) times daily., Disp: 60 tablet, Rfl: 5   folic acid (FOLVITE) 1 MG tablet, Take 1 tablet (1 mg total) by mouth daily., Disp: 30 tablet, Rfl: 0   gabapentin (NEURONTIN) 300 MG capsule, Take by mouth., Disp: , Rfl:    lisinopril (ZESTRIL) 10 MG tablet, Take 1 tablet (10 mg total) by mouth daily., Disp: 90 tablet, Rfl: 3   metFORMIN (GLUCOPHAGE) 500 MG tablet, Take 500 mg by mouth 2 (two) times daily., Disp: , Rfl:    morphine (MS CONTIN) 30 MG 12 hr tablet, Take 30 mg by mouth every 12 (twelve) hours., Disp: , Rfl:    Multiple Vitamin (MULTIVITAMIN WITH MINERALS) TABS tablet,  Take 1 tablet by mouth daily., Disp: , Rfl:    oxycodone (ROXICODONE) 30 MG immediate release tablet, Take 30 mg by mouth 3 (three) times daily., Disp: , Rfl:    rosuvastatin (CRESTOR) 20 MG tablet, Take 1 tablet (20 mg total) by mouth daily., Disp: 90 tablet, Rfl: 3   thiamine 100 MG tablet, Take 1 tablet (100 mg total) by mouth daily., Disp: 30 tablet, Rfl: 0  Social History   Tobacco Use  Smoking Status Some Days   Packs/day: 2.50   Years: 25.00   Additional pack years: 0.00   Total pack years: 62.50   Types: Cigarettes, Cigars   Last attempt to quit: 08/05/1984   Years since quitting: 38.7  Smokeless Tobacco Never  Tobacco Comments   Quit smoking cigarettes; now smokes cigars, wants to quit    No Known Allergies Objective:   There were no vitals filed for this visit.  There is no height or weight on file to calculate BMI. Constitutional Well developed. Well nourished.  Vascular Dorsalis pedis pulses palpable bilaterally. Posterior tibial pulses palpable bilaterally. Capillary refill normal to all digits.  No cyanosis or clubbing noted. Pedal hair growth normal.  Neurologic Normal speech. Oriented to person, place, and time. Protective sensation absent  Dermatologic Wound Location: Right transmetatarsal amputation site stump wound with fat layer exposed.  No purulent drainage noted does not probe down to bone. Wound Base: Mixed Granular/Fibrotic Peri-wound: Calloused Exudate: Scant/small amount Serosanguinous exudate Wound Measurements: -See above  Orthopedic: No pain to palpation either foot.   Radiographs: None Assessment:   1. Ulcer of right foot with fat layer exposed (HCC)   2. Type 2 diabetes mellitus with ketoacidosis and coma, with long-term current use of insulin (HCC)         Plan:  Patient was evaluated and treated and all questions answered.  Ulcer right transmetatarsal amputation stump wound with fat layer exposed -Debridement as  below. -Continue dressed with Betadine wet-to-dry, DSD. -Continue off-loading with cam boot -At this time and clinically not concerning for infection as the wound is not probing down to bone appears to be very superficial without any tunneling.  No correlation with third fourth and fifth metatarsal bone per previous MRI. 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  Procedure: Excisional Debridement of Wound~stagnant Tool: Sharp chisel blade/tissue nipper Rationale: Removal of non-viable soft tissue from the  wound to promote healing.  Anesthesia: none Pre-Debridement Wound Measurements: 1 cm x 0.5 cm x 0.4 cm  Post-Debridement Wound Measurements: 1.2 cm x 0.6 cm x 0.4 cm  Type of Debridement: Sharp Excisional Tissue Removed: Non-viable soft tissue Blood loss: Minimal (<50cc) Depth of Debridement: subcutaneous tissue. Technique: Sharp excisional debridement to bleeding, viable wound base.  Wound Progress: Wound measurements are stagnant has not decreased Site healing conversation 7 Dressing: Dry, sterile, compression dressing. Disposition: Patient tolerated procedure well. Patient to return in 1 week for follow-up.  No follow-ups on file.

## 2023-04-20 ENCOUNTER — Ambulatory Visit (HOSPITAL_COMMUNITY)
Admission: RE | Admit: 2023-04-20 | Discharge: 2023-04-20 | Disposition: A | Discharge status: Home / Self Care | Payer: Medicare HMO | Source: Ambulatory Visit | Attending: Infectious Disease | Admitting: Infectious Disease

## 2023-04-20 DIAGNOSIS — M86671 Other chronic osteomyelitis, right ankle and foot: Secondary | ICD-10-CM | POA: Diagnosis not present

## 2023-04-20 DIAGNOSIS — M7661 Achilles tendinitis, right leg: Secondary | ICD-10-CM | POA: Diagnosis not present

## 2023-04-20 MED ORDER — GADOBUTROL 1 MMOL/ML IV SOLN
10.0000 mL | Freq: Once | INTRAVENOUS | Status: AC | PRN
  Administered 2023-04-20: 10 mL via INTRAVENOUS

## 2023-04-23 NOTE — Progress Notes (Unsigned)
Subjective:  Chief complaint: followup for foot wounds, ? Osteomyelitis in context of alcoholic neuropathy    Patient ID: Eugene Diaz, male    DOB: [birth year], 57 y.o.   MRN: [id number]  HPI  Eugene Diaz is a 57y.o. male alcoholic neuropathy diabetes mellitus and osteomyelitis who is well-known to me from my having seen him as an inpatient and followed him in our clinic.  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  He had additional further debridement as well with bone biopsy of the first metatarsal and wound closure.  He grew E. Coli group B streptococcus as well as beta-lactamase producing Prevotella.  He was narrowed to intravenous cefazolin with oral metronidazole and discharged home with this with his sister helping him with antibiotics.  We switched him over the IV therapy to Augmentin and he was followed by Dr. Samuella Cota by podiatry.  He had some significant problems with depression and passive suicidal ideation and related to the loss of his wife many years ago.  He improved significantly emotionally and actually had reduced his alcohol intake drastically.  He is not completely clean but his intake is dramatically less than in the past when he drank on a daily basis.  Followed him closely and saw him last in November 2022.   He came off his antibiotics and he came back and was seen by my partner Dr. Luciana Axe and seem to have no evidence of active infection at that point in time.   Unfortunately interim he developed worsening gangrenous changes involving the third toe and underwent transmetatarsal amputation with Dr. Marylene Land on December 04, 2021.  Then his foot got caught on a nail that was sticking out of his  porch.  He developed intermittent redness and swelling that worsened recently and was convinced by family come to the emergency department with worsening flexion of his feet.  His foot was warm and swollen and erythematous.  MRI was performed at [facility] which showed a new soft tissue ulcer in the medial forefoot distal to the coronal and medial to the first metatarsal amputation stump.  There is edema and swelling of the ankle and dorsal midfoot and there is marrow edema within the third and fourth and remaining fifth metatarsal shafts as well as some mild edema in the distal aspect the remaining first and second metatarsal shafts all concerning for osteomyelitis.   Patient was seen by general surgery at [facility] he did not want to intervene surgically.  They wanted to try antibiotics.  I was very skeptical that antibiotics were going to get [place] very far but I i ertapenem with doxycycline June 30, 2022.  In the interim he was hospitalized with an episode of unresponsiveness that was thought to be due to too many sedating medications from home though he is seen Chilton Si with cardiology who seems to think the patient had a near syncopal event rather than a drug related overdose.  Patient has had a monitor on since then.  He is also seen Dr. Allena Katz for his foot but it seems that Dr. Allena Katz is not aware (based on his notes prior to my last visiit  the patient's recent hospital admission or MRI.  He has completed IV antibiotics  and was on oral doxycycline and cefdinir.  His wound  continued to improve and he is continue to follow with Dr. Allena Katz.  Dr Allena Katz did not feel that the MRI findings correlated with exam and that he was skeptical of osteomyelitis.   Had seen Eugene Diaz in September 2023 I thought we were stopping antibiotics but turns out his pharmacy was still sending cefdinir and doxycycline at last visit in November I again thought we had conclusively stopped both antibiotics but I  again see that they are both being filled Jw is not sure whether he is actually taking them or getting them but certainly the dispense history shows that [name] is feeling them every month.  In the interim was hospitalized in December 13 with foot swelling increased pain erythema and warmth and worsening serous drainage from his foot wounds.  He had presented to the podiatry clinic and after they examined him they directed him to come to the emergency department for management of infection and initiation of IV antibiotics.   Was started vancomycin cefepime and Flagyl in the ER.   MRI of the right foot was performed which showed onyx soft tissue and ulceration with mild marrow T2 hypertensive in the metatarsal shafts 3-5th  that had improved in the right forefoot and no T1 abnormality or progressive cortical destruction seen with improvement in subcutaneous edema on dorsum of the foot.   Podiatry saw him and felt no indication for surgical intervention and that this was "only cellulitis.  Patient was sent home on doxycycline.  He continued to have drainage from the 2 ulcers and the wounds appear worse to me than when I last examined him.  Eugene Diaz reallycould not  remember much about why he was hospitalized and says his memory is getting worse.  Has developed an ulceration on his left ankle where of the foot brace seems to be rubbing up against the skin.  We changed him over to Augmentin with doxycycline in place of the cefdinir since he had had cellulitis in the face of cefdinir and doxycycline.  Has remained on antibiotics since then.  He has been following closely with Dr. Allena Katz who has not found any evidence of deep infection.  He has had further debridement.  At last visit we had planned on having him stop his antibiotics and observe off of them.  Over his inflammatory markers were disconcerting we had given him a call to resume his doxycycline and Augmentin.  He appeared  to have filled them on late April but not since then.  I also had ordered an MRI in late April but it was not scheduled until June 20 and is still not read given radiology called to see if they can expedite the read.  In the interim his primary care physician has been prescribing him ciprofloxacin with clindamycin which is an antibiotic combination I would like to avoid due to risk of C. difficile colitis    He has continued to see Dr Allena Katz who has peformed further debridements, most recently on 04/19/23.        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) 05/23/2022   Depression    Diabetic neuropathy (HCC)    Edema of  both lower extremities    Essential tremor    neurologist-- dr patel--- due to alcohol abuse   GERD (gastroesophageal reflux disease)    History of cellulitis 2020   left lower leg;   recurrent 03-25-2020   History of esophageal stricture    s/p  dilatation 02-13-2018   History of osteomyelitis 2020   left great toe     Hypercholesterolemia    Hypertension    followed by pcp  (nuclear stress test 03-11-2014 low risk w/ no ischemia, ef 65%   Normocytic anemia    followed by pcp   (03-18-2020 had transfusion's 02/ 2021)   Osteomyelitis of great toe of right foot (HCC) 08/18/2021   Peripheral vascular disease (HCC)    Severe depression (HCC) 06/09/2021   Status post incision and drainage followed by Dr Samuella Cota and pcp   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   (03-17-2020  pt states most of incision is healed with exception an area that is still draining,  daily dressing change),  foot is red but not warm to the touch and has swelling but has improved)   Suicidal ideation 06/09/2021   Syncope and collapse 06/07/2022   Syncope, near 06/20/2022    Thrombocytopenia (HCC)    chronic   Type 2 diabetes mellitus (HCC)    followed by pcp---    (03-18-2020  pt stated does not check blood sugar)   Vaccine counseling 08/29/2022    Past Surgical History:  Procedure Laterality Date   AMPUTATION Left 10/16/2019   Procedure: Left partial second ray resection; placement of antibiotic beads;  Surgeon: Park Liter, DPM;  Location: [facility];  Service: Podiatry;  Laterality: Left;   AMPUTATION Left 11/13/2019   Procedure: Left Midfoot Amputation - Transmetatarsal vs. Lisfranc; Placement antibiotic beads;  Surgeon: Park Liter, DPM;  Location: [facility];  Service: Podiatry;  Laterality: Left;   AMPUTATION Left 12/21/2019   Procedure: Chopart Amputation left foot;  Surgeon: Park Liter, DPM;  Location: [facility];  Service: Podiatry;  Laterality: Left;   AMPUTATION Left 06/24/2020   Procedure: LEFT BELOW KNEE AMPUTATION;  Surgeon: Nadara Mustard, MD;  Location[facility];  Service: Orthopedics;  Laterality: Left;   AMPUTATION Right 03/29/2021   Procedure: AMPUTATION RAY;  Surgeon: Park Liter, DPM;  Location: [facility];  Service: Podiatry;  Laterality: Right;   AMPUTATION TOE Left 08/14/2019   Procedure: AMPUTATION GREAT TOE;  Surgeon: Park Liter, DPM;  Location: [facility];  Service: Podiatry;  Laterality: Left;   APPLICATION OF WOUND VAC Left 12/21/2019   Procedure: Application Of Wound Vac;  Surgeon: Park Liter, DPM;  Location: [facility];  Service: Podiatry;  Laterality: Left;   BIOPSY  02/13/2018   Procedure: BIOPSY;  Surgeon: West Bali, MD;  [facility]: [facility];  Service: Endoscopy;;  transverse colon biopsy, gastric biopsy   COLONOSCOPY WITH PROPOFOL N/A 02/13/2018   Procedure: COLONOSCOPY WITH PROPOFOL;  Surgeon: West Bali, MD;  [facility]: [facility];  Service: Endoscopy;  Laterality: N/A;  11:15am   ESOPHAGOGASTRODUODENOSCOPY (EGD) WITH PROPOFOL N/A 02/13/2018   Procedure: ESOPHAGOGASTRODUODENOSCOPY (EGD) WITH PROPOFOL;   Surgeon: West Bali, MD;  [facility]: [facility];  Service: Endoscopy;  Laterality: N/A;   I & D EXTREMITY Left 07/16/2019   Procedure: Insicion  AND DEBRIDEMENT EXTREMITY;  Surgeon: Park Liter, DPM;  Location: [facility];  Service: Podiatry;  Laterality: Left;   I & D EXTREMITY Left 06/22/2020  Procedure: IRRIGATION AND DEBRIDEMENT EXTREMITY, left foot and ankle;  Surgeon: Park Liter, DPM;  Location: [facility];  Service: Podiatry;  Laterality: Left;   I & D EXTREMITY Right 03/29/2021   Procedure: IRRIGATION AND DEBRIDEMENT EXTREMITY;  Surgeon: Park Liter, DPM;  [facility]: [facility];  Service: Podiatry;  Laterality: Right;   INCISION AND DRAINAGE OF WOUND Left 10/13/2019   Procedure: IRRIGATION AND DEBRIDEMENT WOUND OF LEFT FOOT AND FIRST METATARSAL RESECTION;  Surgeon: Park Liter, DPM;  Location: [facility];  Service: Podiatry;  Laterality: Left;   IRRIGATION AND DEBRIDEMENT FOOT Left 11/11/2019   Procedure: Left Foot Wound Irrigation and Debridement;  Surgeon: Park Liter, DPM;  Location: [facility];  Service: Podiatry;  Laterality: Left;   Left arm     Left arm repair (tendon and artery)   PERCUTANEOUS PINNING  07/16/2019   Procedure: Open Reduction Percutaneous Pinning Extremity;  Surgeon: Park Liter, DPM;  [facility];  Service: Podiatry;;   PILONIDAL CYST EXCISION N/A 08/08/2014   Procedure: CYST EXCISION PILONIDAL EXTENSIVE;  Surgeon: Dalia Heading, MD;  Location: [facility];  Service: General;  Laterality: N/A;   POLYPECTOMY  02/13/2018   Procedure: POLYPECTOMY;  Surgeon: West Bali, MD;  [facility];  Service: Endoscopy;;  transverse colon polyp hs, rectal polyps times 2   SAVORY DILATION N/A 02/13/2018   Procedure: SAVORY DILATION;  Surgeon: West Bali, MD;  [facility];  Service: Endoscopy;  Laterality: N/A;   TENDON LENGTHENING Right 12/04/2021   Procedure: TENDON LENGTHENING;  Surgeon: Asencion Islam, DPM;  Location: [facility];  Service:  Podiatry;  Laterality: Right;   TRANSMETATARSAL AMPUTATION Right 12/04/2021   Procedure: TRANSMETATARSAL AMPUTATION;  Surgeon: Asencion Islam, DPM;  Location[facility];  Service: Podiatry;  Laterality: Right;   WOUND DEBRIDEMENT Left 09/04/2019   Procedure: DEBRIDEMENT WOUND WITH COMPLEX  REPAIR OF DEHISCENCE;  Surgeon: Park Liter, DPM;  Location: [facility] [place];  Service: Podiatry;  Laterality: Left;  Leave patient on stretcher   WOUND DEBRIDEMENT Left 10/16/2019   Procedure: Left foot wound debridement and closure;  Surgeon: Park Liter, DPM;  Location: [facility];  Service: Podiatry;  Laterality: Left;   WOUND DEBRIDEMENT Left 12/18/2019   Procedure: LEFT FOOT DEBRIDEMENT WITH PARTIAL INCISION OF INFECTED BONE;  Surgeon: Park Liter, DPM;  Location: [facility];  Service: Podiatry;  Laterality: Left;  LEFT FOOT DEBRIDEMENT WITH PARTIAL INCISION OF INFECTED BONE   WOUND DEBRIDEMENT Right 04/01/2021   Procedure: Right foot wound debridement and irrigation and closure, bone biopsy;  Surgeon: Park Liter, DPM;  Location: [facility];  Service: Podiatry;  Laterality: Right;    Family History  Problem Relation Age of Onset   Heart attack Mother        Living, 33   Cancer Father        Deceased   Healthy Brother    Healthy Sister    Colon cancer Neg Hx    Colon polyps Neg Hx       Social History   Socioeconomic History   Marital status: Widowed    Spouse name: Not on file   Number of children: 0   Years of education: Not on file   Highest education level: 8th grade  Occupational History   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  Tobacco Use   Smoking status: Some Days    Packs/day: 2.50  Years: 25.00    Additional pack years: 0.00    Total pack years: 62.50    Types: Cigarettes, Cigars    Last attempt to quit: 08/05/1984    Years since quitting: 38.7   Smokeless tobacco: Never   Tobacco comments:    Quit smoking cigarettes; now  smokes cigars, wants to quit  Vaping Use   Vaping Use: Never used  Substance and Sexual Activity   Alcohol use: Yes    Alcohol/week: 50.0 standard drinks of alcohol    Types: 50 Standard drinks or equivalent per week    Comment: Drinks 6 beers, liquor 4-6oz (both daily) x 35 years; now states he "drinks a few beers and let it go"   Drug use: Not Currently    Types: Marijuana   Sexual activity: Yes    Birth control/protection: None  Other Topics Concern   Not on file  Social History Narrative   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 Highest level of education:  8th grade   He lives with wife.  No children.   Left handed   Drinks soda, water-no coffee, no tea   Social Determinants of Health   Financial Resource Strain: Not on file  Food Insecurity: No Food Insecurity (10/13/2022)   Hunger Vital Sign    Worried About Running Out of Food in the Last Year: Never true    Ran Out of Food in the Last Year: Never true  Transportation Needs: No Transportation Needs (10/13/2022)   PRAPARE - Administrator, Civil Service (Medical): No    Lack of Transportation (Non-Medical): No  Physical Activity: Not on file  Stress: Not on file  Social Connections: Not on file    No Known Allergies   Current Outpatient Medications:    acetaminophen (TYLENOL) 325 MG tablet, Take 2 tablets (650 mg total) by mouth every 6 (six) hours as needed for mild pain., Disp: , Rfl:    albuterol (VENTOLIN HFA) 108 (90 Base) MCG/ACT inhaler, Inhale 1-2 puffs into the lungs every 4 (four) hours as needed for wheezing or shortness of breath., Disp: , Rfl:    ALPRAZolam (XANAX) 1 MG tablet, Take 1 mg by mouth 4 (four) times daily., Disp: , Rfl:    amitriptyline (ELAVIL) 100 MG tablet, Take 100 mg by mouth at bedtime., Disp: , Rfl:    amLODipine (NORVASC) 10 MG tablet, Take 1 tablet (10 mg total) by mouth daily., Disp: 90 tablet, Rfl: 3   amoxicillin-clavulanate  (AUGMENTIN) 875-125 MG tablet, Take 1 tablet by mouth 2 (two) times daily., Disp: 60 tablet, Rfl: 5   canagliflozin (INVOKANA) 300 MG TABS tablet, , Disp: , Rfl:    cefdinir (OMNICEF) 300 MG capsule, Take 300 mg by mouth 2 (two) times daily., Disp: , Rfl:    diclofenac Sodium (VOLTAREN) 1 % GEL, Apply 4 g topically 4 (four) times daily as needed (Apply to painful area over left upper front chest.)., Disp: 50 g, Rfl: 0   doxycycline (VIBRA-TABS) 100 MG tablet, Take 1 tablet (100 mg total) by mouth 2 (two) times daily., Disp: 60 tablet, Rfl: 5   folic acid (FOLVITE) 1 MG tablet, Take 1 tablet (1 mg total) by mouth daily., Disp: 30 tablet, Rfl: 0   gabapentin (NEURONTIN) 300 MG capsule, Take by mouth., Disp: , Rfl:    lisinopril (ZESTRIL) 10 MG tablet, Take 1 tablet (10 mg total) by mouth daily., Disp: 90 tablet, Rfl: 3   metFORMIN (  GLUCOPHAGE) 500 MG tablet, Take 500 mg by mouth 2 (two) times daily., Disp: , Rfl:    morphine (MS CONTIN) 30 MG 12 hr tablet, Take 30 mg by mouth every 12 (twelve) hours., Disp: , Rfl:    Multiple Vitamin (MULTIVITAMIN WITH MINERALS) TABS tablet, Take 1 tablet by mouth daily., Disp: , Rfl:    oxycodone (ROXICODONE) 30 MG immediate release tablet, Take 30 mg by mouth 3 (three) times daily., Disp: , Rfl:    rosuvastatin (CRESTOR) 20 MG tablet, Take 1 tablet (20 mg total) by mouth daily., Disp: 90 tablet, Rfl: 3   thiamine 100 MG tablet, Take 1 tablet (100 mg total) by mouth daily., Disp: 30 tablet, Rfl: 0   Review of Systems     Objective:   Physical Exam  Right lower extremity 06/20/2022:    Right foot 07/20/2022:    Foot 08/29/2022:     10/19/2022:    Right foot 11/23/2022:            Right foot 02/20/2023:      April 24, 2023:    Assessment & Plan:   ?  Osteomyelitis involving right foot:  I have asked radiology to expedite the read on the films.  If the films do show osteomyelitis I need him to go back and see Dr. Allena Katz for  consideration of bone biopsy for culture off antibiotics.  I will check a sed rate and CRP CBC differential CMP with GFR in the interim   I have personally spent 42 minutes involved in face-to-face and non-face-to-face activities for this patient on the day of the visit. Professional time spent includes the following activities: Preparing to see the patient (review of tests), Obtaining and/or reviewing separately obtained history (admission/discharge record), Performing a medically appropriate examination and/or evaluation , Ordering medications/tests/procedures, referring and communicating with other health care professionals, Documenting clinical information in the EMR, Independently interpreting results (not separately reported), Communicating results to the patient/family/caregiver, Counseling and educating the patient/family/caregiver and Care coordination (not separately reported).    ADDENDUM: MRI read and report as follows:  MPRESSION: 1. Chronic soft tissue ulceration plantar to the remaining 1st metatarsal with increased surrounding soft tissue enhancement consistent with cellulitis. There is a small abscess along the plantar aspect of the 1st tarsometatarsal joint. 2. Recurrent osteomyelitis involving the remaining 1st metatarsal and the medial cuneiform. 3. Mildly increased marrow T2 hyperintensity distally in the remaining 2nd metatarsal without definite progressive cortical destruction or suspicious marrow enhancement. 4. Non insertional Achilles tendinosis.  I have forwarded MRI report to Dr. Allena Katz  I will have him change to augmentin and doxycyline until Dr. Allena Katz can evaluate him for surgery

## 2023-04-24 ENCOUNTER — Encounter: Payer: Self-pay | Admitting: Infectious Disease

## 2023-04-24 ENCOUNTER — Other Ambulatory Visit: Payer: Self-pay | Admitting: Infectious Disease

## 2023-04-24 ENCOUNTER — Ambulatory Visit (INDEPENDENT_AMBULATORY_CARE_PROVIDER_SITE_OTHER): Payer: Medicare HMO | Admitting: Infectious Disease

## 2023-04-24 ENCOUNTER — Other Ambulatory Visit: Payer: Self-pay

## 2023-04-24 VITALS — BP 137/76 | HR 75 | Temp 97.1°F

## 2023-04-24 DIAGNOSIS — F1090 Alcohol use, unspecified, uncomplicated: Secondary | ICD-10-CM

## 2023-04-24 DIAGNOSIS — E1142 Type 2 diabetes mellitus with diabetic polyneuropathy: Secondary | ICD-10-CM

## 2023-04-24 DIAGNOSIS — E1152 Type 2 diabetes mellitus with diabetic peripheral angiopathy with gangrene: Secondary | ICD-10-CM

## 2023-04-24 DIAGNOSIS — M86172 Other acute osteomyelitis, left ankle and foot: Secondary | ICD-10-CM

## 2023-04-24 DIAGNOSIS — E1151 Type 2 diabetes mellitus with diabetic peripheral angiopathy without gangrene: Secondary | ICD-10-CM

## 2023-04-24 DIAGNOSIS — E1169 Type 2 diabetes mellitus with other specified complication: Secondary | ICD-10-CM

## 2023-04-24 DIAGNOSIS — Z794 Long term (current) use of insulin: Secondary | ICD-10-CM | POA: Diagnosis not present

## 2023-04-24 DIAGNOSIS — M86371 Chronic multifocal osteomyelitis, right ankle and foot: Secondary | ICD-10-CM | POA: Diagnosis not present

## 2023-04-24 DIAGNOSIS — G621 Alcoholic polyneuropathy: Secondary | ICD-10-CM

## 2023-04-24 LAB — CBC WITH DIFFERENTIAL/PLATELET
Basophils Absolute: 83 cells/uL (ref 0–200)
Hemoglobin: 17 g/dL (ref 13.2–17.1)
MCH: 32.5 pg (ref 27.0–33.0)
MCHC: 34.1 g/dL (ref 32.0–36.0)
MCV: 95.2 fL (ref 80.0–100.0)
MPV: 9.8 fL (ref 7.5–12.5)
Platelets: 230 10*3/uL (ref 140–400)
WBC: 6.9 10*3/uL (ref 3.8–10.8)

## 2023-04-24 MED ORDER — DOXYCYCLINE HYCLATE 100 MG PO TABS: 100.0000 mg | ORAL_TABLET | Freq: Two times a day (BID) | ORAL | 5 refills | Status: DC

## 2023-04-24 MED ORDER — AMOXICILLIN-POT CLAVULANATE 875-125 MG PO TABS: 1.0000 | ORAL_TABLET | Freq: Two times a day (BID) | ORAL | 5 refills | Status: DC

## 2023-04-24 NOTE — Patient Instructions (Signed)
Stop antibiotics  I will followup on MRI read and IF I feel you need antibiotics then I would like to be the one (or one of my partners would) to pick them

## 2023-04-25 LAB — CBC WITH DIFFERENTIAL/PLATELET
Absolute Monocytes: 311 cells/uL (ref 200–950)
Basophils Relative: 1.2 %
Eosinophils Absolute: 511 cells/uL — ABNORMAL HIGH (ref 15–500)
Eosinophils Relative: 7.4 %
HCT: 49.8 % (ref 38.5–50.0)
Lymphs Abs: 2691 cells/uL (ref 850–3900)
Monocytes Relative: 4.5 %
Neutro Abs: 3305 cells/uL (ref 1500–7800)
Neutrophils Relative %: 47.9 %
RBC: 5.23 10*6/uL (ref 4.20–5.80)
RDW: 14.7 % (ref 11.0–15.0)
Total Lymphocyte: 39 %

## 2023-04-25 LAB — COMPLETE METABOLIC PANEL WITH GFR
AG Ratio: 1.1 (calc) (ref 1.0–2.5)
ALT: 10 U/L (ref 9–46)
AST: 17 U/L (ref 10–35)
Albumin: 4 g/dL (ref 3.6–5.1)
Alkaline phosphatase (APISO): 91 U/L (ref 35–144)
BUN: 11 mg/dL (ref 7–25)
CO2: 22 mmol/L (ref 20–32)
Calcium: 9.3 mg/dL (ref 8.6–10.3)
Chloride: 105 mmol/L (ref 98–110)
Creat: 0.82 mg/dL (ref 0.70–1.30)
Globulin: 3.6 g/dL (calc) (ref 1.9–3.7)
Glucose, Bld: 102 mg/dL — ABNORMAL HIGH (ref 65–99)
Potassium: 4.3 mmol/L (ref 3.5–5.3)
Sodium: 138 mmol/L (ref 135–146)
Total Bilirubin: 0.4 mg/dL (ref 0.2–1.2)
Total Protein: 7.6 g/dL (ref 6.1–8.1)
eGFR: 102 mL/min/{1.73_m2} (ref >=60)

## 2023-04-25 LAB — SEDIMENTATION RATE: Sed Rate: 9 mm/h (ref 0–20)

## 2023-04-25 LAB — C-REACTIVE PROTEIN: CRP: 5.6 mg/L (ref <8.0)

## 2023-04-26 ENCOUNTER — Other Ambulatory Visit (INDEPENDENT_AMBULATORY_CARE_PROVIDER_SITE_OTHER): Payer: Medicare HMO | Admitting: Podiatry

## 2023-04-26 NOTE — Progress Notes (Signed)
   Based on his outpatient MRI findings given that there is finding of abscess with concern for worsening osteomyelitis I encouraged the patient to go to the emergency room to be admitted for IV antibiotics and surgical intervention.  Patient states understanding and he will go to the emergency room as soon as possible.      Nicholes Rough D.P.M.

## 2023-04-26 NOTE — Progress Notes (Addendum)
Based on his outpatient MRI findings given that there is finding of abscess with concern for worsening osteomyelitis I encouraged the patient to go to the emergency room to be admitted for IV antibiotics and surgical intervention.  Patient states understanding and he will go to the emergency room as soon as possible.

## 2023-04-27 ENCOUNTER — Inpatient Hospital Stay (HOSPITAL_COMMUNITY)
Admission: EM | Admit: 2023-04-27 | Discharge: 2023-05-01 | DRG: 617 | Disposition: A | Discharge status: Home Health | Payer: Medicare HMO | Source: Ambulatory Visit | Attending: Internal Medicine | Admitting: Internal Medicine

## 2023-04-27 ENCOUNTER — Other Ambulatory Visit: Payer: Self-pay

## 2023-04-27 ENCOUNTER — Encounter (HOSPITAL_COMMUNITY): Payer: Self-pay

## 2023-04-27 DIAGNOSIS — J45909 Unspecified asthma, uncomplicated: Secondary | ICD-10-CM | POA: Diagnosis not present

## 2023-04-27 DIAGNOSIS — Z79899 Other long term (current) drug therapy: Secondary | ICD-10-CM

## 2023-04-27 DIAGNOSIS — L97519 Non-pressure chronic ulcer of other part of right foot with unspecified severity: Secondary | ICD-10-CM | POA: Diagnosis present

## 2023-04-27 DIAGNOSIS — E7800 Pure hypercholesterolemia, unspecified: Secondary | ICD-10-CM | POA: Diagnosis present

## 2023-04-27 DIAGNOSIS — I493 Ventricular premature depolarization: Secondary | ICD-10-CM | POA: Diagnosis present

## 2023-04-27 DIAGNOSIS — E538 Deficiency of other specified B group vitamins: Secondary | ICD-10-CM | POA: Diagnosis present

## 2023-04-27 DIAGNOSIS — M7731 Calcaneal spur, right foot: Secondary | ICD-10-CM | POA: Diagnosis not present

## 2023-04-27 DIAGNOSIS — I10 Essential (primary) hypertension: Secondary | ICD-10-CM | POA: Diagnosis not present

## 2023-04-27 DIAGNOSIS — E1151 Type 2 diabetes mellitus with diabetic peripheral angiopathy without gangrene: Secondary | ICD-10-CM | POA: Diagnosis present

## 2023-04-27 DIAGNOSIS — M898X8 Other specified disorders of bone, other site: Secondary | ICD-10-CM | POA: Diagnosis not present

## 2023-04-27 DIAGNOSIS — I2510 Atherosclerotic heart disease of native coronary artery without angina pectoris: Secondary | ICD-10-CM | POA: Diagnosis not present

## 2023-04-27 DIAGNOSIS — F419 Anxiety disorder, unspecified: Secondary | ICD-10-CM | POA: Diagnosis present

## 2023-04-27 DIAGNOSIS — G250 Essential tremor: Secondary | ICD-10-CM | POA: Diagnosis present

## 2023-04-27 DIAGNOSIS — L97512 Non-pressure chronic ulcer of other part of right foot with fat layer exposed: Secondary | ICD-10-CM | POA: Diagnosis not present

## 2023-04-27 DIAGNOSIS — D696 Thrombocytopenia, unspecified: Secondary | ICD-10-CM | POA: Diagnosis not present

## 2023-04-27 DIAGNOSIS — Z89512 Acquired absence of left leg below knee: Secondary | ICD-10-CM | POA: Diagnosis not present

## 2023-04-27 DIAGNOSIS — F32A Depression, unspecified: Secondary | ICD-10-CM | POA: Diagnosis not present

## 2023-04-27 DIAGNOSIS — R21 Rash and other nonspecific skin eruption: Secondary | ICD-10-CM | POA: Diagnosis present

## 2023-04-27 DIAGNOSIS — M1990 Unspecified osteoarthritis, unspecified site: Secondary | ICD-10-CM | POA: Diagnosis present

## 2023-04-27 DIAGNOSIS — M86671 Other chronic osteomyelitis, right ankle and foot: Secondary | ICD-10-CM | POA: Diagnosis not present

## 2023-04-27 DIAGNOSIS — F17210 Nicotine dependence, cigarettes, uncomplicated: Secondary | ICD-10-CM | POA: Diagnosis present

## 2023-04-27 DIAGNOSIS — M86171 Other acute osteomyelitis, right ankle and foot: Secondary | ICD-10-CM | POA: Diagnosis not present

## 2023-04-27 DIAGNOSIS — M869 Osteomyelitis, unspecified: Secondary | ICD-10-CM | POA: Diagnosis not present

## 2023-04-27 DIAGNOSIS — S98911A Complete traumatic amputation of right foot, level unspecified, initial encounter: Secondary | ICD-10-CM | POA: Diagnosis not present

## 2023-04-27 DIAGNOSIS — E1152 Type 2 diabetes mellitus with diabetic peripheral angiopathy with gangrene: Secondary | ICD-10-CM | POA: Diagnosis not present

## 2023-04-27 DIAGNOSIS — Z9889 Other specified postprocedural states: Secondary | ICD-10-CM | POA: Diagnosis not present

## 2023-04-27 DIAGNOSIS — I96 Gangrene, not elsewhere classified: Secondary | ICD-10-CM | POA: Diagnosis not present

## 2023-04-27 DIAGNOSIS — Z79891 Long term (current) use of opiate analgesic: Secondary | ICD-10-CM

## 2023-04-27 DIAGNOSIS — E1142 Type 2 diabetes mellitus with diabetic polyneuropathy: Secondary | ICD-10-CM | POA: Diagnosis present

## 2023-04-27 DIAGNOSIS — L089 Local infection of the skin and subcutaneous tissue, unspecified: Secondary | ICD-10-CM | POA: Diagnosis present

## 2023-04-27 DIAGNOSIS — E1169 Type 2 diabetes mellitus with other specified complication: Principal | ICD-10-CM | POA: Diagnosis present

## 2023-04-27 DIAGNOSIS — F1010 Alcohol abuse, uncomplicated: Secondary | ICD-10-CM | POA: Diagnosis present

## 2023-04-27 DIAGNOSIS — G621 Alcoholic polyneuropathy: Secondary | ICD-10-CM | POA: Diagnosis not present

## 2023-04-27 DIAGNOSIS — Z7984 Long term (current) use of oral hypoglycemic drugs: Secondary | ICD-10-CM | POA: Diagnosis not present

## 2023-04-27 DIAGNOSIS — E11621 Type 2 diabetes mellitus with foot ulcer: Secondary | ICD-10-CM | POA: Diagnosis present

## 2023-04-27 DIAGNOSIS — K219 Gastro-esophageal reflux disease without esophagitis: Secondary | ICD-10-CM | POA: Diagnosis present

## 2023-04-27 DIAGNOSIS — M86371 Chronic multifocal osteomyelitis, right ankle and foot: Secondary | ICD-10-CM | POA: Diagnosis present

## 2023-04-27 DIAGNOSIS — E119 Type 2 diabetes mellitus without complications: Secondary | ICD-10-CM

## 2023-04-27 DIAGNOSIS — Z8249 Family history of ischemic heart disease and other diseases of the circulatory system: Secondary | ICD-10-CM | POA: Diagnosis not present

## 2023-04-27 DIAGNOSIS — L03115 Cellulitis of right lower limb: Secondary | ICD-10-CM | POA: Diagnosis not present

## 2023-04-27 LAB — CBC WITH DIFFERENTIAL/PLATELET
Abs Immature Granulocytes: 0.04 10*3/uL (ref 0.00–0.07)
Basophils Absolute: 0.1 10*3/uL (ref 0.0–0.1)
Basophils Relative: 1 %
Eosinophils Absolute: 0.6 10*3/uL — ABNORMAL HIGH (ref 0.0–0.5)
Eosinophils Relative: 7 %
HCT: 52 % (ref 39.0–52.0)
Hemoglobin: 17.6 g/dL — ABNORMAL HIGH (ref 13.0–17.0)
Immature Granulocytes: 1 %
Lymphocytes Relative: 36 %
Lymphs Abs: 3.1 10*3/uL (ref 0.7–4.0)
MCH: 33.1 pg (ref 26.0–34.0)
MCHC: 33.8 g/dL (ref 30.0–36.0)
MCV: 97.7 fL (ref 80.0–100.0)
Monocytes Absolute: 0.4 10*3/uL (ref 0.1–1.0)
Monocytes Relative: 5 %
Neutro Abs: 4.4 10*3/uL (ref 1.7–7.7)
Neutrophils Relative %: 50 %
Platelets: 237 10*3/uL (ref 150–400)
RBC: 5.32 MIL/uL (ref 4.22–5.81)
RDW: 15.8 % — ABNORMAL HIGH (ref 11.5–15.5)
WBC: 8.6 10*3/uL (ref 4.0–10.5)
nRBC: 0 % (ref 0.0–0.2)

## 2023-04-27 LAB — COMPREHENSIVE METABOLIC PANEL
ALT: 10 U/L (ref 0–44)
AST: 14 U/L — ABNORMAL LOW (ref 15–41)
Albumin: 3.7 g/dL (ref 3.5–5.0)
Alkaline Phosphatase: 91 U/L (ref 38–126)
Anion gap: 9 (ref 5–15)
BUN: 13 mg/dL (ref 6–20)
CO2: 23 mmol/L (ref 22–32)
Calcium: 9.2 mg/dL (ref 8.9–10.3)
Chloride: 101 mmol/L (ref 98–111)
Creatinine, Ser: 1.1 mg/dL (ref 0.61–1.24)
GFR, Estimated: >60 mL/min (ref >60)
Glucose, Bld: 121 mg/dL — ABNORMAL HIGH (ref 70–99)
Potassium: 4.4 mmol/L (ref 3.5–5.1)
Sodium: 133 mmol/L — ABNORMAL LOW (ref 135–145)
Total Bilirubin: 0.6 mg/dL (ref 0.3–1.2)
Total Protein: 7.7 g/dL (ref 6.5–8.1)

## 2023-04-27 LAB — GLUCOSE, CAPILLARY
Glucose-Capillary: 104 mg/dL — ABNORMAL HIGH (ref 70–99)
Glucose-Capillary: 144 mg/dL — ABNORMAL HIGH (ref 70–99)
Glucose-Capillary: 147 mg/dL — ABNORMAL HIGH (ref 70–99)

## 2023-04-27 MED ORDER — LISINOPRIL 10 MG PO TABS: 10.0000 mg | ORAL_TABLET | Freq: Every day | ORAL | Status: DC

## 2023-04-27 MED ORDER — MORPHINE SULFATE ER 15 MG PO TBCR
30.0000 mg | EXTENDED_RELEASE_TABLET | Freq: Two times a day (BID) | ORAL | Status: DC
  Administered 2023-04-27 – 2023-05-01 (×8): 30 mg via ORAL
  Filled 2023-04-27 (×8): qty 2

## 2023-04-27 MED ORDER — BISACODYL 5 MG PO TBEC: 5.0000 mg | DELAYED_RELEASE_TABLET | Freq: Every day | ORAL | Status: DC | PRN

## 2023-04-27 MED ORDER — ALPRAZOLAM 0.5 MG PO TABS
1.0000 mg | ORAL_TABLET | Freq: Four times a day (QID) | ORAL | Status: DC
  Administered 2023-04-27 – 2023-05-01 (×10): 1 mg via ORAL
  Filled 2023-04-27: qty 2
  Filled 2023-04-27 (×2): qty 4
  Filled 2023-04-27 (×4): qty 2
  Filled 2023-04-27: qty 4
  Filled 2023-04-27: qty 2
  Filled 2023-04-27: qty 4

## 2023-04-27 MED ORDER — FOLIC ACID 1 MG PO TABS
1.0000 mg | ORAL_TABLET | Freq: Every day | ORAL | Status: DC
  Administered 2023-04-27 – 2023-05-01 (×5): 1 mg via ORAL
  Filled 2023-04-27 (×5): qty 1

## 2023-04-27 MED ORDER — AMLODIPINE BESYLATE 5 MG PO TABS: 10.0000 mg | ORAL_TABLET | Freq: Every day | ORAL | Status: DC

## 2023-04-27 MED ORDER — ALBUTEROL SULFATE (2.5 MG/3ML) 0.083% IN NEBU: 2.5000 mg | INHALATION_SOLUTION | RESPIRATORY_TRACT | Status: DC | PRN

## 2023-04-27 MED ORDER — THIAMINE MONONITRATE 100 MG PO TABS
100.0000 mg | ORAL_TABLET | Freq: Every day | ORAL | Status: DC
  Administered 2023-04-27 – 2023-05-01 (×5): 100 mg via ORAL
  Filled 2023-04-27 (×7): qty 1

## 2023-04-27 MED ORDER — ACETAMINOPHEN 325 MG PO TABS: 650.0000 mg | ORAL_TABLET | Freq: Four times a day (QID) | ORAL | Status: DC | PRN

## 2023-04-27 MED ORDER — ENOXAPARIN SODIUM 60 MG/0.6ML IJ SOSY
55.0000 mg | PREFILLED_SYRINGE | INTRAMUSCULAR | Status: DC
  Administered 2023-04-27: 55 mg via SUBCUTANEOUS
  Filled 2023-04-27: qty 0.6

## 2023-04-27 MED ORDER — ALBUTEROL SULFATE HFA 108 (90 BASE) MCG/ACT IN AERS: 1.0000 | INHALATION_SPRAY | RESPIRATORY_TRACT | Status: DC | PRN

## 2023-04-27 MED ORDER — SENNOSIDES-DOCUSATE SODIUM 8.6-50 MG PO TABS: 1.0000 | ORAL_TABLET | Freq: Every evening | ORAL | Status: DC | PRN

## 2023-04-27 MED ORDER — AMITRIPTYLINE HCL 50 MG PO TABS
100.0000 mg | ORAL_TABLET | Freq: Every day | ORAL | Status: DC
  Administered 2023-04-27 – 2023-04-30 (×3): 100 mg via ORAL
  Filled 2023-04-27 (×2): qty 2
  Filled 2023-04-27: qty 4

## 2023-04-27 MED ORDER — ROSUVASTATIN CALCIUM 20 MG PO TABS
20.0000 mg | ORAL_TABLET | Freq: Every day | ORAL | Status: DC
  Administered 2023-04-27 – 2023-05-01 (×5): 20 mg via ORAL
  Filled 2023-04-27 (×5): qty 1

## 2023-04-27 MED ORDER — OXYCODONE HCL 5 MG PO TABS
30.0000 mg | ORAL_TABLET | Freq: Four times a day (QID) | ORAL | Status: DC | PRN
  Administered 2023-04-27 – 2023-04-30 (×6): 30 mg via ORAL
  Filled 2023-04-27 (×6): qty 6

## 2023-04-27 MED ORDER — INSULIN ASPART 100 UNIT/ML IJ SOLN
0.0000 [IU] | Freq: Three times a day (TID) | INTRAMUSCULAR | Status: DC
  Administered 2023-04-27 – 2023-04-30 (×3): 2 [IU] via SUBCUTANEOUS
  Administered 2023-04-30 (×2): 3 [IU] via SUBCUTANEOUS

## 2023-04-27 MED ORDER — GABAPENTIN 300 MG PO CAPS: 300.0000 mg | ORAL_CAPSULE | Freq: Three times a day (TID) | ORAL | Status: DC

## 2023-04-27 NOTE — Consult Note (Signed)
 PODIATRY CONSULTATION  NAME Eugene Diaz MRN [id number] DOB 11/18/1965 DOA 04/27/2023   Reason for consult: Abscess/osteomyelitis RT foot Chief Complaint  Patient presents with   Foot Problem    Consulting physician: Dr. Ping Zhang MD, [facility]  History of present illness: 57 y.o. male chronic right foot diabetic ulcer with recurrent osteomyelitis, left BKA admitted for worsening infection right foot.  MRI results evidence of recurrent osteomyelitis to the right foot amputation site.  ED Course: Afebrile with no tachycardia nonhypotensive.  WBC 8.6, MRI reviewed that showed first metatarsal surrounding soft tissue cellulitis and small abscess along the plantar aspect of the first tarsometatarsal joint and recurrent osteomyelitis involving the remaining first metatarsal and medial cuneiform.   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) 05/23/2022   Depression    Diabetic neuropathy (HCC)    Edema of both lower extremities    Essential tremor    neurologist-- dr patel--- due to alcohol abuse   GERD (gastroesophageal reflux disease)    History of cellulitis 2020   left lower leg;   recurrent 03-25-2020   History of esophageal stricture    s/p  dilatation 02-13-2018   History of osteomyelitis 2020   left great toe     Hypercholesterolemia    Hypertension    followed by pcp  (nuclear stress test 03-11-2014 low risk w/ no ischemia, ef 65%   Normocytic anemia    followed by pcp   (03-18-2020 had transfusion's 02/ 2021)   Osteomyelitis of great toe of right foot (HCC) 08/18/2021   Peripheral vascular disease (HCC)    Severe depression (HCC) 06/09/2021   Status post incision and drainage followed by Dr Price and pcp   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   (03-17-2020  pt states most of incision is healed with exception an area that is still draining,  daily dressing change),  foot is red but not warm to the touch and has swelling but has improved)   Suicidal ideation 06/09/2021   Syncope and collapse 06/07/2022   Syncope, near 06/20/2022   Thrombocytopenia (HCC)    chronic   Type 2 diabetes mellitus (HCC)    followed by pcp---    (03-18-2020  pt stated does not check blood sugar)   Vaccine counseling 08/29/2022       Latest Ref Rng & Units 04/27/2023   11:39 AM 04/24/2023    2:37 AM 02/20/2023    2:46 AM  CBC  WBC 4.0 - 10.5 K/uL 8.6  6.9  8.2   Hemoglobin 13.0 - 17.0 g/dL 17.6  17.0  13.4   Hematocrit 39.0 - 52.0 % 52.0  49.8  39.7   Platelets 150 - 400 K/uL 237  230  291        Latest Ref Rng & Units 04/27/2023   11:39 AM 04/24/2023    2:37 AM 02/20/2023    2:46 AM  BMP  Glucose 70 - 99 mg/dL 121  102  148   BUN 6 - 20 mg/dL 13  11  11   Creatinine 0.61 - 1.24 mg/dL 1.10  0.82  0.78   BUN/Creat Ratio 6 - 22 (calc)  SEE NOTE:  SEE   NOTE:   Sodium 135 - 145 mmol/L 133  138  137   Potassium 3.5 - 5.1 mmol/L 4.4  4.3  4.2   Chloride 98 - 111 mmol/L 101  105  104   CO2 22 - 32 mmol/L 23  22  25   Calcium 8.9 - 10.3 mg/dL 9.2  9.3  8.9     RT foot   Physical Exam: General: The patient is alert and oriented x3 in no acute distress.   Dermatology: Skin is warm, dry and supple bilateral lower extremities.  Ulcer noted medial aspect of the distal amputation stump RLE.  Please see above noted photo  Vascular: VAS US [name] W/WO TBI 10/13/2022 [name] Findings:  +---------+------------------+-----+---------+--------+  Right   Rt Pressure (mmHg)IndexWaveform Comment   +---------+------------------+-----+---------+--------+  Brachial 146                    triphasic          +---------+------------------+-----+---------+--------+  PTA     184               1.26 triphasic           +---------+------------------+-----+---------+--------+  DP      159               1.09 triphasic          +---------+------------------+-----+---------+--------+  Great Toe                                TMA       +---------+------------------+-----+---------+--------+   +--------+------------------+-----+--------+-------+  Left   Lt Pressure (mmHg)IndexWaveformComment  +--------+------------------+-----+--------+-------+  Brachial141                                    +--------+------------------+-----+--------+-------+  PTA                                   BKA      +--------+------------------+-----+--------+-------+  DP                                    BKA      +--------+------------------+-----+--------+-------+   +-------+-----------+-----------+------------+------------+  [name]/TBIToday's ABIToday's TBIPrevious ABIPrevious TBI  +-------+-----------+-----------+------------+------------+  Right 1.26       TMA                                  +-------+-----------+-----------+------------+------------+  Left  BKA                                             +-------+-----------+-----------+------------+------------+  Summary:  Right: Resting right ankle-brachial index is within normal range.  Left: BKA.   Neurological: Light touch and protective threshold absent  Musculoskeletal Exam: BKA LLE.  TMA RLE.  MR FOOT RIGHT W WO CONTRAST 04/20/2023 Soft tissues Chronic soft tissue ulceration plantar to the remaining 1st metatarsal with increased surrounding soft tissue enhancement. There is a small peripherally enhancing fluid collection along the plantar aspect of the 1st tarsometatarsal joint, measuring 1.3 x 1.2 cm on   image 31/9. No other focal fluid collections are identified.   IMPRESSION: 1. Chronic soft tissue ulceration plantar to the remaining 1st metatarsal with increased surrounding soft tissue  enhancement consistent with cellulitis. There is a small abscess along the plantar aspect of the 1st tarsometatarsal joint. 2. Recurrent osteomyelitis involving the remaining 1st metatarsal and the medial cuneiform. 3. Mildly increased marrow T2 hyperintensity distally in the remaining 2nd metatarsal without definite progressive cortical destruction or suspicious marrow enhancement. 4. Non insertional Achilles tendinosis.  ASSESSMENT/PLAN OF CARE 1.  Diabetic foot ulcer with underlying residual osteomyelitis RT foot 2.  BKA LLE  -Patient seen at bedside.  MRI reviewed -Given the location of the recurrent osteomyelitis patient is high risk for BKA RLE.  We will attempt limb salvage in order to preserve the limb.  Discussed with the patient the need for surgery.  He is amenable to this plan -Surgery will consist of more proximal foot amputation with bone biopsy right foot. -Preoperative orders placed. NPO after midnight.  Tentatively plan for tomorrow, 04/28/2023, afternoon -Podiatry will continue to follow     Thank you for the consult.  Please contact me directly via secure chat with any questions or concerns.     Beaux Wedemeyer M. Madailein Londo, DPM [facility]  Dr. Hien Cunliffe M. Haadi Santellan, DPM    [address]                                        [place], [place] [address]                Office [phone]  Fax [phone]     

## 2023-04-27 NOTE — Plan of Care (Signed)
  Problem: Education: Goal: Knowledge of General Education information will improve Description: Including pain rating scale, medication(s)/side effects and non-pharmacologic comfort measures Outcome: Progressing   Problem: Clinical Measurements: Goal: Will remain free from infection Outcome: Progressing   Problem: Activity: Goal: Risk for activity intolerance will decrease Outcome: Progressing   Problem: Skin Integrity: Goal: Risk for impaired skin integrity will decrease Outcome: Progressing   Problem: Activity: Goal: Risk for activity intolerance will decrease Outcome: Progressing   Problem: Elimination: Goal: Will not experience complications related to bowel motility Outcome: Progressing   Problem: Skin Integrity: Goal: Risk for impaired skin integrity will decrease Outcome: Progressing

## 2023-04-27 NOTE — ED Notes (Signed)
L prosthetic leg. R foot ace wrap and boot.

## 2023-04-27 NOTE — ED Provider Notes (Signed)
[place] EMERGENCY DEPARTMENT AT [facility] Provider Note   CSN: [id number] Arrival date & time: 04/27/23  1112     History  Chief Complaint  Patient presents with   Foot Problem    Eugene Diaz is a 56 y.o. male.  HPI Patient with osteomyelitis of right foot/stump.  Sent in for admission by podiatry.  Had outpatient MRI that showed recurrence of osteomyelitis with abscess.  Has been followed by both podiatry, Dr. Allena Katz, and infectious ease, Dr. Algis Liming.  Currently on Augmentin and doxycycline.   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) 05/23/2022   Depression    Diabetic neuropathy (HCC)    Edema of both lower extremities    Essential tremor    neurologist-- dr patel--- due to alcohol abuse   GERD (gastroesophageal reflux disease)    History of cellulitis 2020   left lower leg;   recurrent 03-25-2020   History of esophageal stricture    s/p  dilatation 02-13-2018   History of osteomyelitis 2020   left great toe     Hypercholesterolemia    Hypertension    followed by pcp  (nuclear stress test 03-11-2014 low risk w/ no ischemia, ef 65%   Normocytic anemia    followed by pcp   (03-18-2020 had transfusion's 02/ 2021)   Osteomyelitis of great toe of right foot (HCC) 08/18/2021   Peripheral vascular disease (HCC)    Severe depression (HCC) 06/09/2021   Status post incision and drainage followed by Dr Samuella Cota and pcp   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 Authorizing Provider  acetaminophen (TYLENOL) 325 MG tablet Take 2 tablets (650 mg total) by mouth every 6 (six) hours as needed for mild pain. 03/03/22   Hongalgi, Maximino Greenland, MD  albuterol (VENTOLIN HFA) 108 (90 Base) MCG/ACT inhaler Inhale 1-2 puffs into the lungs every 4 (four) hours as needed for wheezing or shortness of breath. 05/11/22   [provider]  ALPRAZolam Prudy Feeler) 1 MG tablet Take 1 mg by mouth 4 (four) times daily. 05/14/22   [provider]  amitriptyline (ELAVIL) 100 MG tablet Take 100 mg by mouth at bedtime. 09/15/22   [provider]  amLODipine (NORVASC) 10 MG tablet Take 1 tablet (10 mg total) by mouth daily. 02/15/23   Alver Sorrow, NP  amoxicillin-clavulanate (AUGMENTIN) 875-125 MG tablet Take 1 tablet by mouth 2 (two) times daily. 04/24/23   Randall Hiss, MD  canagliflozin [facility]) 300 MG TABS tablet  11/16/15   [provider]  diclofenac Sodium (VOLTAREN)  1 % GEL Apply 4 g topically 4 (four) times daily as needed (Apply to painful area over left upper front chest.). 03/03/22   Hongalgi, Maximino Greenland, MD  doxycycline (VIBRA-TABS) 100 MG tablet Take 1 tablet (100 mg total) by mouth 2 (two) times daily. 04/24/23   Randall Hiss, MD  folic acid (FOLVITE) 1 MG tablet Take 1 tablet (1 mg total) by mouth daily. 03/03/22   Hongalgi, Maximino Greenland, MD  gabapentin (NEURONTIN) 300 MG capsule Take by mouth. 08/17/16   [provider]  lisinopril (ZESTRIL) 10 MG tablet Take 1 tablet (10 mg total) by mouth daily. 02/16/23 02/11/24  Chilton Si, MD  metFORMIN (GLUCOPHAGE) 500 MG tablet Take 500 mg by mouth 2 (two) times daily. 05/11/22   [provider]  morphine  (MS CONTIN) 30 MG 12 hr tablet Take 30 mg by mouth every 12 (twelve) hours. 05/14/22   [provider]  Multiple Vitamin (MULTIVITAMIN WITH MINERALS) TABS tablet Take 1 tablet by mouth daily. 10/14/22   Glade Lloyd, MD  oxycodone (ROXICODONE) 30 MG immediate release tablet Take 30 mg by mouth 3 (three) times daily. 05/14/22   [provider]  rosuvastatin (CRESTOR) 20 MG tablet Take 1 tablet (20 mg total) by mouth daily. 03/20/23   Chilton Si, MD  thiamine 100 MG tablet Take 1 tablet (100 mg total) by mouth daily. 03/04/22   Hongalgi, Maximino Greenland, MD      Allergies    Patient has no known allergies.    Review of Systems   Review of Systems  Physical Exam Updated Vital Signs BP 106/67 (BP Location: Right Arm)   Pulse 89   Temp 97.7 F (36.5 C) (Oral)   Resp 16   Ht 6\' 2"  (1.88 m)   Wt 108.9 kg   SpO2 96%   BMI 30.82 kg/m  Physical Exam Vitals and nursing note reviewed.  Cardiovascular:     Rate and Rhythm: Regular rhythm.  Musculoskeletal:     Comments: Previous left below the knee amputation.  Previous right midfoot amputation currently in dressing.  Neurological:     Mental Status: He is alert.    Picture from 3 days ago ED Results / Procedures / Treatments   Labs (all labs ordered are listed, but only abnormal results are displayed) Labs Reviewed  CBC WITH DIFFERENTIAL/PLATELET - Abnormal; Notable for the following components:      Result Value   Hemoglobin 17.6 (*)    RDW 15.8 (*)    Eosinophils Absolute 0.6 (*)    All other components within normal limits  COMPREHENSIVE METABOLIC PANEL    EKG None  Radiology No results found.  Procedures Procedures    Medications Ordered in ED Medications - No data to display  ED Course/ Medical Decision Making/ A&P                             Medical Decision Making Amount and/or Complexity of Data Reviewed Labs: ordered.   Patient with osteomyelitis with abscess of right foot.  Sent in by  podiatry for admission.  Discussed with Dr. Logan Bores, who is covering for Dr. Allena Katz.  No plans for or today but he will see in consult with potential OR tomorrow.  I reviewed podiatry note and infectious disease note.  Infectious disease request to be the one to pick antibiotics.  Currently on Augmentin and doxycycline.  Does not appear septic at this time.  CBC and CMP sent.  Will discuss with hospitalist for admission.        Final Clinical Impression(s) / ED Diagnoses Final diagnoses:  Osteomyelitis of right foot, unspecified type [facility])    Rx / DC Orders ED Discharge Orders     None         Benjiman Core, MD 04/27/23 1231

## 2023-04-27 NOTE — H&P (View-Only) (Signed)
PODIATRY CONSULTATION  NAME Eugene Diaz MRN [id number] DOB [birth year] DOA 04/27/2023   Reason for consult: Abscess/osteomyelitis RT foot Chief Complaint  Patient presents with   Foot Problem    Consulting physician: Dr. Mikey College MD, [facility]  History of present illness: 57 y.o. male chronic right foot diabetic ulcer with recurrent osteomyelitis, left BKA admitted for worsening infection right foot.  MRI results evidence of recurrent osteomyelitis to the right foot amputation site.  ED Course: Afebrile with no tachycardia nonhypotensive.  WBC 8.6, MRI reviewed that showed first metatarsal surrounding soft tissue cellulitis and small abscess along the plantar aspect of the first tarsometatarsal joint and recurrent osteomyelitis involving the remaining first metatarsal and medial cuneiform.   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) 05/23/2022   Depression    Diabetic neuropathy (HCC)    Edema of both lower extremities    Essential tremor    neurologist-- dr patel--- due to alcohol abuse   GERD (gastroesophageal reflux disease)    History of cellulitis 2020   left lower leg;   recurrent 03-25-2020   History of esophageal stricture    s/p  dilatation 02-13-2018   History of osteomyelitis 2020   left great toe     Hypercholesterolemia    Hypertension    followed by pcp  (nuclear stress test 03-11-2014 low risk w/ no ischemia, ef 65%   Normocytic anemia    followed by pcp   (03-18-2020 had transfusion's 02/ 2021)   Osteomyelitis of great toe of right foot (HCC) 08/18/2021   Peripheral vascular disease (HCC)    Severe depression (HCC) 06/09/2021   Status post incision and drainage followed by Dr Samuella Cota and pcp   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   (03-17-2020  pt states most of incision is healed with exception an area that is still draining,  daily dressing change),  foot is red but not warm to the touch and has swelling but has improved)   Suicidal ideation 06/09/2021   Syncope and collapse 06/07/2022   Syncope, near 06/20/2022   Thrombocytopenia (HCC)    chronic   Type 2 diabetes mellitus (HCC)    followed by pcp---    (03-18-2020  pt stated does not check blood sugar)   Vaccine counseling 08/29/2022       Latest Ref Rng & Units 04/27/2023   11:39 AM 04/24/2023    2:37 AM 02/20/2023    2:46 AM  CBC  WBC 4.0 - 10.5 K/uL 8.6  6.9  8.2   Hemoglobin 13.0 - 17.0 g/dL 40.9  81.1  91.4   Hematocrit 39.0 - 52.0 % 52.0  49.8  39.7   Platelets 150 - 400 K/uL 237  230  291        Latest Ref Rng & Units 04/27/2023   11:39 AM 04/24/2023    2:37 AM 02/20/2023    2:46 AM  BMP  Glucose 70 - 99 mg/dL 782  956  213   BUN 6 - 20 mg/dL 13  11  11    Creatinine 0.61 - 1.24 mg/dL 0.86  5.78  4.69   BUN/Creat Ratio 6 - 22 (calc)  SEE NOTE:  SEE  NOTE:   Sodium 135 - 145 mmol/L 133  138  137   Potassium 3.5 - 5.1 mmol/L 4.4  4.3  4.2   Chloride 98 - 111 mmol/L 101  105  104   CO2 22 - 32 mmol/L 23  22  25    Calcium 8.9 - 10.3 mg/dL 9.2  9.3  8.9     RT foot   Physical Exam: General: The patient is alert and oriented x3 in no acute distress.   Dermatology: Skin is warm, dry and supple bilateral lower extremities.  Ulcer noted medial aspect of the distal amputation stump RLE.  Please see above noted photo  Vascular: VAS [place] [name] W/WO TBI 10/13/2022 [name] Findings:  +---------+------------------+-----+---------+--------+  Right   Rt Pressure (mmHg)IndexWaveform Comment   +---------+------------------+-----+---------+--------+  Brachial 146                    triphasic          +---------+------------------+-----+---------+--------+  PTA     184               1.26 triphasic           +---------+------------------+-----+---------+--------+  DP      159               1.09 triphasic          +---------+------------------+-----+---------+--------+  Great Toe                                TMA       +---------+------------------+-----+---------+--------+   +--------+------------------+-----+--------+-------+  Left   Lt Pressure (mmHg)IndexWaveformComment  +--------+------------------+-----+--------+-------+  [id number]                                    +--------+------------------+-----+--------+-------+  PTA                                   BKA      +--------+------------------+-----+--------+-------+  DP                                    BKA      +--------+------------------+-----+--------+-------+   +-------+-----------+-----------+------------+------------+  [name]/TBIToday's ABIToday's TBIPrevious ABIPrevious TBI  +-------+-----------+-----------+------------+------------+  Right 1.26       TMA                                  +-------+-----------+-----------+------------+------------+  Left  BKA                                             +-------+-----------+-----------+------------+------------+  Summary:  Right: Resting right ankle-brachial index is within normal range.  Left: BKA.   Neurological: Light touch and protective threshold absent  Musculoskeletal Exam: BKA LLE.  TMA RLE.  MR FOOT RIGHT W WO CONTRAST 04/20/2023 Soft tissues Chronic soft tissue ulceration plantar to the remaining 1st metatarsal with increased surrounding soft tissue enhancement. There is a small peripherally enhancing fluid collection along the plantar aspect of the 1st tarsometatarsal joint, measuring 1.3 x 1.2 cm on  image 31/9. No other focal fluid collections are identified.   IMPRESSION: 1. Chronic soft tissue ulceration plantar to the remaining 1st metatarsal with increased surrounding soft tissue  enhancement consistent with cellulitis. There is a small abscess along the plantar aspect of the 1st tarsometatarsal joint. 2. Recurrent osteomyelitis involving the remaining 1st metatarsal and the medial cuneiform. 3. Mildly increased marrow T2 hyperintensity distally in the remaining 2nd metatarsal without definite progressive cortical destruction or suspicious marrow enhancement. 4. Non insertional Achilles tendinosis.  ASSESSMENT/PLAN OF CARE 1.  Diabetic foot ulcer with underlying residual osteomyelitis RT foot 2.  BKA LLE  -Patient seen at bedside.  MRI reviewed -Given the location of the recurrent osteomyelitis patient is high risk for BKA RLE.  We will attempt limb salvage in order to preserve the limb.  Discussed with the patient the need for surgery.  He is amenable to this plan -Surgery will consist of more proximal foot amputation with bone biopsy right foot. -Preoperative orders placed. NPO after midnight.  Tentatively plan for tomorrow, 04/28/2023, afternoon -Podiatry will continue to follow     Thank you for the consult.  Please contact me directly via secure chat with any questions or concerns.     Felecia Shelling, DPM [facility]  Dr. Felecia Shelling, DPM    [address] [place], [place] [address]                Office [phone]  Fax [phone]

## 2023-04-27 NOTE — Plan of Care (Signed)
  Problem: Education: Goal: Ability to describe self-care measures that may prevent or decrease complications (Diabetes Survival Skills Education) will improve Outcome: Progressing Goal: Individualized Educational Video(s) Outcome: Progressing   Problem: Coping: Goal: Ability to adjust to condition or change in health will improve Outcome: Progressing   Problem: Health Behavior/Discharge Planning: Goal: Ability to identify and utilize available resources and services will improve Outcome: Progressing Goal: Ability to manage health-related needs will improve Outcome: Progressing   Problem: Nutritional: Goal: Maintenance of adequate nutrition will improve Outcome: Progressing Goal: Progress toward achieving an optimal weight will improve Outcome: Progressing   Problem: Activity: Goal: Risk for activity intolerance will decrease Outcome: Progressing   Problem: Nutrition: Goal: Adequate nutrition will be maintained Outcome: Progressing   Problem: Coping: Goal: Level of anxiety will decrease Outcome: Progressing

## 2023-04-27 NOTE — ED Notes (Signed)
ED TO INPATIENT HANDOFF REPORT  ED Nurse Name and Phone #: Vernona Rieger  [phone]  S Name/Age/Gender Eugene Diaz 57 y.o. male Room/Bed: H018C/H018C  Code Status   Code Status: Full Code  Home/SNF/Other Home Patient oriented to: self, place, time, and situation Is this baseline? Yes   Triage Complete: Triage complete  Chief Complaint Osteomyelitis [facility]) [M86.9]  Triage Note Patient presents from home stating that Dr. Daiva Eves told him to come to the hospital today because he wants to do surgery on his foot related to osteomyelitis.    Allergies No Known Allergies  Level of Care/Admitting Diagnosis ED Disposition     ED Disposition  Admit   Condition  --   Comment  Hospital Area: [facility] [[id number]]  Level of Care: Med-Surg [16]  May admit patient to [facility] or [facility] if equivalent level of care is available:: No  Covid Evaluation: Asymptomatic - no recent exposure (last 10 days) testing not required  Diagnosis: Osteomyelitis [facility]) [[id number]]  Admitting Physician: Emeline General [[id number]]  Attending Physician: Emeline General [[id number]]  Certification:: I certify this patient will need inpatient services for at least 2 midnights  Estimated Length of Stay: 4          B Medical/Surgery History 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) 05/23/2022   Depression    Diabetic neuropathy (HCC)    Edema of both lower extremities    Essential tremor    neurologist-- dr patel--- due to alcohol abuse   GERD (gastroesophageal reflux disease)    History of cellulitis 2020   left lower leg;   recurrent 03-25-2020   History of esophageal stricture    s/p  dilatation 02-13-2018   History of osteomyelitis 2020   left great toe      Hypercholesterolemia    Hypertension    followed by pcp  (nuclear stress test 03-11-2014 low risk w/ no ischemia, ef 65%   Normocytic anemia    followed by pcp   (03-18-2020 had transfusion's 02/ 2021)   Osteomyelitis of great toe of right foot (HCC) 08/18/2021   Peripheral vascular disease (HCC)    Severe depression (HCC) 06/09/2021   Status post incision and drainage followed by Dr Samuella Cota and pcp   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   (03-17-2020  pt states most of incision is healed with exception an area that is still draining,  daily dressing change),  foot is red but not warm to the touch and has swelling but has improved)   Suicidal ideation 06/09/2021   Syncope and collapse 06/07/2022   Syncope, near 06/20/2022   Thrombocytopenia (HCC)    chronic   Type 2 diabetes mellitus (HCC)    followed by pcp---    (03-18-2020  pt stated does not check blood sugar)   Vaccine counseling 08/29/2022   Past Surgical History:  Procedure Laterality Date   AMPUTATION Left 10/16/2019   Procedure: Left partial second ray resection; placement of antibiotic beads;  Surgeon: Park Liter, DPM;  Location: [facility];  Service: Podiatry;  Laterality: Left;   AMPUTATION Left 11/13/2019   Procedure: Left Midfoot Amputation - Transmetatarsal vs. Lisfranc; Placement antibiotic beads;  Surgeon: Park Liter, DPM;  Location: [facility];  Service: Podiatry;  Laterality: Left;   AMPUTATION Left 12/21/2019   Procedure: Chopart Amputation left foot;  Surgeon: Park Liter, DPM;  [facility]: [facility];  Service: Podiatry;  Laterality: Left;   AMPUTATION Left 06/24/2020   Procedure: LEFT BELOW KNEE AMPUTATION;  Surgeon: Nadara Mustard, MD;  Location: [facility];  Service: Orthopedics;  Laterality: Left;   AMPUTATION Right 03/29/2021   Procedure: AMPUTATION RAY;  Surgeon: Park Liter, DPM;  Location: [facility];  Service: Podiatry;  Laterality: Right;   AMPUTATION TOE Left 08/14/2019   Procedure: AMPUTATION GREAT TOE;   Surgeon: Park Liter, DPM;  Location: [facility];  Service: Podiatry;  Laterality: Left;   APPLICATION OF WOUND VAC Left 12/21/2019   Procedure: Application Of Wound Vac;  Surgeon: Park Liter, DPM;  [facility]: [facility];  Service: Podiatry;  Laterality: Left;   BIOPSY  02/13/2018   Procedure: BIOPSY;  Surgeon: West Bali, MD;  [facility];  Service: Endoscopy;;  transverse colon biopsy, gastric biopsy   COLONOSCOPY WITH PROPOFOL N/A 02/13/2018   Procedure: COLONOSCOPY WITH PROPOFOL;  Surgeon: West Bali, MD;  [facility];  Service: Endoscopy;  Laterality: N/A;  11:15am   ESOPHAGOGASTRODUODENOSCOPY (EGD) WITH PROPOFOL N/A 02/13/2018   Procedure: ESOPHAGOGASTRODUODENOSCOPY (EGD) WITH PROPOFOL;  Surgeon: West Bali, MD;  [facility];  Service: Endoscopy;  Laterality: N/A;   I & D EXTREMITY Left 07/16/2019   Procedure: Insicion  AND DEBRIDEMENT EXTREMITY;  Surgeon: Park Liter, DPM;  [facility];  Service: Podiatry;  Laterality: Left;   I & D EXTREMITY Left 06/22/2020   Procedure: IRRIGATION AND DEBRIDEMENT EXTREMITY, left foot and ankle;  Surgeon: Park Liter, DPM;  [facility];  Service: Podiatry;  Laterality: Left;   I & D EXTREMITY Right 03/29/2021   Procedure: IRRIGATION AND DEBRIDEMENT EXTREMITY;  Surgeon: Park Liter, DPM;  [facility];  Service: Podiatry;  Laterality: Right;   INCISION AND DRAINAGE OF WOUND Left 10/13/2019   Procedure: IRRIGATION AND DEBRIDEMENT WOUND OF LEFT FOOT AND FIRST METATARSAL RESECTION;  Surgeon: Park Liter, DPM;  [facility]: [facility];  Service: Podiatry;  Laterality: Left;   IRRIGATION AND DEBRIDEMENT FOOT Left 11/11/2019   Procedure: Left Foot Wound Irrigation and Debridement;  Surgeon: Park Liter, DPM;  Location: [facility];  Service: Podiatry;  Laterality: Left;   Left arm     Left arm repair (tendon and artery)   PERCUTANEOUS PINNING  07/16/2019   Procedure: Open Reduction Percutaneous  Pinning Extremity;  Surgeon: Park Liter, DPM;  [facility];  Service: Podiatry;;   PILONIDAL CYST EXCISION N/A 08/08/2014   Procedure: CYST EXCISION PILONIDAL EXTENSIVE;  Surgeon: Dalia Heading, MD;  [facility]: [facility];  Service: General;  Laterality: N/A;   POLYPECTOMY  02/13/2018   Procedure: POLYPECTOMY;  Surgeon: West Bali, MD;  [facility];  Service: Endoscopy;;  transverse colon polyp hs, rectal polyps times 2   SAVORY DILATION N/A 02/13/2018   Procedure: SAVORY DILATION;  Surgeon: West Bali, MD;  [facility];  Service: Endoscopy;  Laterality: N/A;   TENDON LENGTHENING Right 12/04/2021   Procedure: TENDON LENGTHENING;  Surgeon: Asencion Islam, DPM;  [facility];  Service: Podiatry;  Laterality: Right;   TRANSMETATARSAL AMPUTATION Right 12/04/2021   Procedure: TRANSMETATARSAL AMPUTATION;  Surgeon: Asencion Islam, DPM;  [facility];  Service:  Podiatry;  Laterality: Right;   WOUND DEBRIDEMENT Left 09/04/2019   Procedure: DEBRIDEMENT WOUND WITH COMPLEX  REPAIR OF DEHISCENCE;  Surgeon: Park Liter, DPM;  Location: [facility];  Service: Podiatry;  Laterality: Left;  Leave patient on stretcher   WOUND DEBRIDEMENT Left 10/16/2019   Procedure: Left foot wound debridement and closure;  Surgeon: Park Liter, DPM;  Location: [facility];  Service: Podiatry;  Laterality: Left;   WOUND DEBRIDEMENT Left 12/18/2019   Procedure: LEFT FOOT DEBRIDEMENT WITH PARTIAL INCISION OF INFECTED BONE;  Surgeon: Park Liter, DPM;  [facility];  Service: Podiatry;  Laterality: Left;  LEFT FOOT DEBRIDEMENT WITH PARTIAL INCISION OF INFECTED BONE   WOUND DEBRIDEMENT Right 04/01/2021   Procedure: Right foot wound debridement and irrigation and closure, bone biopsy;  Surgeon: Park Liter, DPM;  [facility];  Service: Podiatry;  Laterality: Right;     A IV Location/Drains/Wounds Patient Lines/Drains/Airways Status     Active  Line/Drains/Airways     Name Placement date Placement time Site Days   Peripheral IV 04/27/23 20 G Anterior;Right;Upper Arm 04/27/23  1200  Arm  less than 1   Wound / Incision (Open or Dehisced) 05/20/22 Non-pressure wound Foot Anterior;Right Open area to right foot amputation measuring 2.5cm X 2cm X.8cm 05/20/22  2020  Foot  342            Intake/Output Last 24 hours No intake or output data in the 24 hours ending 04/27/23 1259  Labs/Imaging Results for orders placed or performed during the hospital encounter of 04/27/23 (from the past 48 hour(s))  CBC with Differential     Status: Abnormal   Collection Time: 04/27/23 11:39 AM  Result Value Ref Range   WBC 8.6 4.0 - 10.5 K/uL   RBC 5.32 4.22 - 5.81 MIL/uL   Hemoglobin 17.6 (H) 13.0 - 17.0 g/dL   HCT 42.7 06.2 - 37.6 %   MCV 97.7 80.0 - 100.0 fL   MCH 33.1 26.0 - 34.0 pg   MCHC 33.8 30.0 - 36.0 g/dL   RDW 28.3 (H) 15.1 - 76.1 %   Platelets 237 150 - 400 K/uL   nRBC 0.0 0.0 - 0.2 %   Neutrophils Relative % 50 %   Neutro Abs 4.4 1.7 - 7.7 K/uL   Lymphocytes Relative 36 %   Lymphs Abs 3.1 0.7 - 4.0 K/uL   Monocytes Relative 5 %   Monocytes Absolute 0.4 0.1 - 1.0 K/uL   Eosinophils Relative 7 %   Eosinophils Absolute 0.6 (H) 0.0 - 0.5 K/uL   Basophils Relative 1 %   Basophils Absolute 0.1 0.0 - 0.1 K/uL   Immature Granulocytes 1 %   Abs Immature Granulocytes 0.04 0.00 - 0.07 K/uL    Comment: Performed at [facility], [address], [place], [place] [address]   No results found.  Pending Labs Unresulted Labs (From admission, onward)     Start     Ordered   04/27/23 1250  Hemoglobin A1c  Once,   R       Comments: To assess prior glycemic control    04/27/23 1250   04/27/23 1139  Comprehensive metabolic panel  Once,   STAT        04/27/23 1138            Vitals/Pain Today's Vitals   04/27/23 1127 04/27/23 1130 04/27/23 1131 04/27/23 1258  BP: 106/67   111/71  Pulse: 89  85  Resp: 16   18  Temp:   97.7 F (36.5 C)  98 F (36.7 C)  TempSrc:  Oral  Oral  SpO2: 96%   96%  Weight:   108.9 kg   Height:   6\' 2"  (1.88 m)   PainSc: 6        Isolation Precautions No active isolations  Medications Medications  acetaminophen (TYLENOL) tablet 650 mg (has no administration in time range)  morphine (MS CONTIN) 12 hr tablet 30 mg (has no administration in time range)  oxyCODONE (Oxy IR/ROXICODONE) immediate release tablet 30 mg (has no administration in time range)  amLODipine (NORVASC) tablet 10 mg (has no administration in time range)  lisinopril (ZESTRIL) tablet 10 mg (has no administration in time range)  rosuvastatin (CRESTOR) tablet 20 mg (has no administration in time range)  ALPRAZolam (XANAX) tablet 1 mg (has no administration in time range)  amitriptyline (ELAVIL) tablet 100 mg (has no administration in time range)  folic acid (FOLVITE) tablet 1 mg (has no administration in time range)  gabapentin (NEURONTIN) capsule 300 mg (has no administration in time range)  thiamine (VITAMIN B1) tablet 100 mg (has no administration in time range)  albuterol (VENTOLIN HFA) 108 (90 Base) MCG/ACT inhaler 1-2 puff (has no administration in time range)  enoxaparin (LOVENOX) injection 55 mg (has no administration in time range)  senna-docusate (Senokot-S) tablet 1 tablet (has no administration in time range)  bisacodyl (DULCOLAX) EC tablet 5 mg (has no administration in time range)  insulin aspart (novoLOG) injection 0-15 Units (has no administration in time range)    Mobility walks     Focused Assessments Cardiac Assessment Handoff:    Lab Results  Component Value Date   TROPONINI <0.03 10/31/2018   Lab Results  Component Value Date   DDIMER 0.29 03/11/2014   Does the Patient currently have chest pain? No    R Recommendations: See Admitting Provider Note  Report given to:   Additional Notes: L prosthetic leg. R ft ace wrap with boot.

## 2023-04-27 NOTE — Progress Notes (Signed)
Case discussed with oncall ID attending Dr. Judie Petit. Thedore Mins, who recommend as long as no systemic infection, we hold off ABX before podiatry procedure.

## 2023-04-27 NOTE — H&P (Signed)
History and Physical    Eugene Diaz JXB:[id number] DOB: 1965/11/19 DOA: 04/27/2023  PCP: Elfredia Nevins, MD (Confirm with patient/family/NH records and if not entered, this has to be entered at [facility] point of entry) Patient coming from: Home  I have personally briefly reviewed patient's old medical records in [facility]  Chief Complaint: Right foot infection  HPI: Eugene Diaz is a 57 y.o. male with medical history significant of chronic right foot diabetic ulcer infection, IIDM, HTN, frequent PVCs, HLD, diabetic neuropathy, anxiety/depression, left BKA, sent from infection disease office for evaluation of worsening of right foot infection.  Patient had right transmetatarsal amputation with a chronic open ulcer on the medial side of the stump and was found to be infected recently.  Has been following with infectious disease and podiatry for the chronic infection, and was started on p.o. antibiotics doxycycline and Augmentin last week and underwent MRI 3 days ago.  Yesterday, patient was called about MRI results showed evidence of recurrent osteomyelitis and instructed to come to ED. patient reported severe pain associated with the right foot stump and ulcer for which he takes alternate oxycodone and p.o. morphine.  He denies any fever or chills at home.  ED Course: Afebrile with no tachycardia nonhypotensive.  WBC 8.6, MRI reviewed that showed first metatarsal surrounding soft tissue cellulitis and small abscess along the plantar aspect of the first tarsometatarsal joint and recurrent osteomyelitis involving the remaining first metatarsal and medial cuneiform.  Review of Systems: As per HPI otherwise 14 point review of systems negative.    Past Medical History:  Diagnosis Date   Alcohol abuse    Alcoholic peripheral neuropathy (HCC)    Anxiety    Arthritis    Asthma    followed by pcp   B12 deficiency    CAD in native artery 06/07/2022   Chronic multifocal osteomyelitis of  right foot (HCC) 05/23/2022   Depression    Diabetic neuropathy (HCC)    Edema of both lower extremities    Essential tremor    neurologist-- dr patel--- due to alcohol abuse   GERD (gastroesophageal reflux disease)    History of cellulitis 2020   left lower leg;   recurrent 03-25-2020   History of esophageal stricture    s/p  dilatation 02-13-2018   History of osteomyelitis 2020   left great toe     Hypercholesterolemia    Hypertension    followed by pcp  (nuclear stress test 03-11-2014 low risk w/ no ischemia, ef 65%   Normocytic anemia    followed by pcp   (03-18-2020 had transfusion's 02/ 2021)   Osteomyelitis of great toe of right foot (HCC) 08/18/2021   Peripheral vascular disease (HCC)    Severe depression (HCC) 06/09/2021   Status post incision and drainage followed by Dr Samuella Cota and pcp   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   (03-17-2020  pt states most of incision is healed with exception an area that is still draining,  daily dressing change),  foot is red but not warm to the touch and has swelling but has improved)   Suicidal ideation 06/09/2021   Syncope and collapse 06/07/2022   Syncope, near 06/20/2022   Thrombocytopenia (HCC)    chronic   Type 2 diabetes mellitus (HCC)    followed by pcp---    (03-18-2020  pt stated does not check blood sugar)   Vaccine counseling 08/29/2022    Past Surgical History:  Procedure Laterality Date  AMPUTATION Left 10/16/2019   Procedure: Left partial second ray resection; placement of antibiotic beads;  Surgeon: Park Liter, DPM;  Location: [facility];  Service: Podiatry;  Laterality: Left;   AMPUTATION Left 11/13/2019   Procedure: Left Midfoot Amputation - Transmetatarsal vs. Lisfranc; Placement antibiotic beads;  Surgeon: Park Liter, DPM;  [facility]: [facility];  Service: Podiatry;  Laterality: Left;   AMPUTATION Left 12/21/2019   Procedure: Chopart Amputation left foot;  Surgeon: Park Liter, DPM;  [facility];   Service: Podiatry;  Laterality: Left;   AMPUTATION Left 06/24/2020   Procedure: LEFT BELOW KNEE AMPUTATION;  Surgeon: Nadara Mustard, MD;  Location: [facility] OR;  Service: Orthopedics;  Laterality: Left;   AMPUTATION Right 03/29/2021   Procedure: AMPUTATION RAY;  Surgeon: Park Liter, DPM;  [facility]: [facility];  Service: Podiatry;  Laterality: Right;   AMPUTATION TOE Left 08/14/2019   Procedure: AMPUTATION GREAT TOE;  Surgeon: Park Liter, DPM;  [facility];  Service: Podiatry;  Laterality: Left;   APPLICATION OF WOUND VAC Left 12/21/2019   Procedure: Application Of Wound Vac;  Surgeon: Park Liter, DPM;  [facility];  Service: Podiatry;  Laterality: Left;   BIOPSY  02/13/2018   Procedure: BIOPSY;  Surgeon: West Bali, MD;  [facility];  Service: Endoscopy;;  transverse colon biopsy, gastric biopsy   COLONOSCOPY WITH PROPOFOL N/A 02/13/2018   Procedure: COLONOSCOPY WITH PROPOFOL;  Surgeon: West Bali, MD;  [facility];  Service: Endoscopy;  Laterality: N/A;  11:15am   ESOPHAGOGASTRODUODENOSCOPY (EGD) WITH PROPOFOL N/A 02/13/2018   Procedure: ESOPHAGOGASTRODUODENOSCOPY (EGD) WITH PROPOFOL;  Surgeon: West Bali, MD;  [facility];  Service: Endoscopy;  Laterality: N/A;   I & D EXTREMITY Left 07/16/2019   Procedure: Insicion  AND DEBRIDEMENT EXTREMITY;  Surgeon: Park Liter, DPM;  [facility];  Service: Podiatry;  Laterality: Left;   I & D EXTREMITY Left 06/22/2020   Procedure: IRRIGATION AND DEBRIDEMENT EXTREMITY, left foot and ankle;  Surgeon: Park Liter, DPM;  [facility];  Service: Podiatry;  Laterality: Left;   I & D EXTREMITY Right 03/29/2021   Procedure: IRRIGATION AND DEBRIDEMENT EXTREMITY;  Surgeon: Park Liter, DPM;  [facility];  Service: Podiatry;  Laterality: Right;   INCISION AND DRAINAGE OF WOUND Left 10/13/2019   Procedure: IRRIGATION AND DEBRIDEMENT WOUND OF LEFT FOOT AND FIRST METATARSAL RESECTION;   Surgeon: Park Liter, DPM;  [facility];  Service: Podiatry;  Laterality: Left;   IRRIGATION AND DEBRIDEMENT FOOT Left 11/11/2019   Procedure: Left Foot Wound Irrigation and Debridement;  Surgeon: Park Liter, DPM;  [facility];  Service: Podiatry;  Laterality: Left;   Left arm     Left arm repair (tendon and artery)   PERCUTANEOUS PINNING  07/16/2019   Procedure: Open Reduction Percutaneous Pinning Extremity;  Surgeon: Park Liter, DPM;  [facility];  Service: Podiatry;;   PILONIDAL CYST EXCISION N/A 08/08/2014   Procedure: CYST EXCISION PILONIDAL EXTENSIVE;  Surgeon: Dalia Heading, MD;  [facility];  Service: General;  Laterality: N/A;   POLYPECTOMY  02/13/2018   Procedure: POLYPECTOMY;  Surgeon: West Bali, MD;  [facility];  Service: Endoscopy;;  transverse colon polyp hs, rectal polyps times 2   SAVORY DILATION N/A 02/13/2018   Procedure: SAVORY DILATION;  Surgeon: West Bali, MD;  [facility];  Service: Endoscopy;  Laterality: N/A;   TENDON LENGTHENING Right 12/04/2021   Procedure: TENDON LENGTHENING;  Surgeon: Asencion Islam, DPM;  [facility];  Service: Podiatry;  Laterality: Right;   TRANSMETATARSAL AMPUTATION Right 12/04/2021   Procedure: TRANSMETATARSAL AMPUTATION;  Surgeon: Asencion Islam, DPM;  [facility];  Service: Podiatry;  Laterality: Right;   WOUND DEBRIDEMENT Left 09/04/2019   Procedure: DEBRIDEMENT WOUND WITH COMPLEX  REPAIR OF DEHISCENCE;  Surgeon: Park Liter, DPM;  Location[facility] ;  Service: Podiatry;  Laterality: Left;  Leave patient on stretcher   WOUND DEBRIDEMENT Left 10/16/2019   Procedure: Left foot wound debridement and closure;  Surgeon: Park Liter, DPM;  [facility];  Service: Podiatry;  Laterality: Left;   WOUND DEBRIDEMENT Left 12/18/2019   Procedure: LEFT FOOT DEBRIDEMENT WITH PARTIAL INCISION OF INFECTED BONE;  Surgeon: Park Liter, DPM;  [facility];  Service: Podiatry;  Laterality: Left;  LEFT FOOT DEBRIDEMENT WITH PARTIAL INCISION OF INFECTED BONE   WOUND DEBRIDEMENT Right 04/01/2021   Procedure: Right foot wound debridement and irrigation and closure, bone biopsy;  Surgeon: Park Liter, DPM;  [facility];  Service: Podiatry;  Laterality: Right;     reports that he has been smoking cigarettes and cigars. He has a 62.50 pack-year smoking history. He has never used smokeless tobacco. He reports current alcohol use of about 50.0 standard drinks of alcohol per week. He reports that he does not currently use drugs after having used the following drugs: Marijuana.  No Known Allergies  Family History  Problem Relation Age of Onset   Heart attack Mother        Living, 63   Cancer Father        Deceased   Healthy Brother    Healthy Sister    Colon cancer Neg Hx    Colon polyps Neg Hx      Prior to Admission medications   Medication Sig Start Date End Date Taking? Authorizing Provider  acetaminophen (TYLENOL) 325 MG tablet Take 2 tablets (650 mg total) by mouth every 6 (six) hours as needed for mild pain. 03/03/22   Hongalgi, Maximino Greenland, MD  albuterol (VENTOLIN HFA) 108 (90 Base) MCG/ACT inhaler Inhale 1-2 puffs into the lungs every 4 (four) hours as needed for wheezing or shortness of breath. 05/11/22   [provider]  ALPRAZolam Prudy Feeler) 1 MG tablet Take 1 mg by mouth 4 (four) times daily. 05/14/22   [provider]  amitriptyline (ELAVIL) 100 MG tablet Take 100 mg by mouth at bedtime. 09/15/22   [provider]  amLODipine (NORVASC) 10 MG tablet Take 1 tablet (10 mg total) by mouth daily. 02/15/23   Alver Sorrow, NP  amoxicillin-clavulanate (AUGMENTIN) 875-125 MG tablet Take 1 tablet by mouth 2 (two) times daily. 04/24/23   Randall Hiss, MD  canagliflozin [facility]) 300 MG TABS tablet  11/16/15   [provider]  diclofenac Sodium (VOLTAREN) 1 % GEL Apply 4 g topically 4 (four) times  daily as needed (Apply to painful area over left upper front chest.). 03/03/22   Hongalgi, Maximino Greenland, MD  doxycycline (VIBRA-TABS) 100 MG tablet Take 1 tablet (100 mg total) by mouth 2 (two) times daily. 04/24/23   Randall Hiss, MD  folic acid (FOLVITE) 1 MG tablet Take 1 tablet (1 mg total) by mouth daily. 03/03/22   Hongalgi, Maximino Greenland, MD  gabapentin (NEURONTIN) 300 MG capsule Take by mouth. 08/17/16  [provider]  lisinopril (ZESTRIL) 10 MG tablet Take 1 tablet (10 mg total) by mouth daily. 02/16/23 02/11/24  Chilton Si, MD  metFORMIN (GLUCOPHAGE) 500 MG tablet Take 500 mg by mouth 2 (two) times daily. 05/11/22   [provider]  morphine (MS CONTIN) 30 MG 12 hr tablet Take 30 mg by mouth every 12 (twelve) hours. 05/14/22   [provider]  Multiple Vitamin (MULTIVITAMIN WITH MINERALS) TABS tablet Take 1 tablet by mouth daily. 10/14/22   Glade Lloyd, MD  oxycodone (ROXICODONE) 30 MG immediate release tablet Take 30 mg by mouth 3 (three) times daily. 05/14/22   [provider]  rosuvastatin (CRESTOR) 20 MG tablet Take 1 tablet (20 mg total) by mouth daily. 03/20/23   Chilton Si, MD  thiamine 100 MG tablet Take 1 tablet (100 mg total) by mouth daily. 03/04/22   Elease Etienne, MD    Physical Exam: Vitals:   04/27/23 1127 04/27/23 1130 04/27/23 1131  BP: 106/67    Pulse: 89    Resp: 16    Temp:  97.7 F (36.5 C)   TempSrc:  Oral   SpO2: 96%    Weight:   108.9 kg  Height:   6\' 2"  (1.88 m)    Constitutional: NAD, calm, comfortable Vitals:   04/27/23 1127 04/27/23 1130 04/27/23 1131  BP: 106/67    Pulse: 89    Resp: 16    Temp:  97.7 F (36.5 C)   TempSrc:  Oral   SpO2: 96%    Weight:   108.9 kg  Height:   6\' 2"  (1.88 m)   Eyes: PERRL, lids and conjunctivae normal ENMT: Mucous membranes are moist. Posterior pharynx clear of any exudate or lesions.Normal dentition.  Neck: normal, supple, no masses, no thyromegaly Respiratory:  clear to auscultation bilaterally, no wheezing, no crackles. Normal respiratory effort. No accessory muscle use.  Cardiovascular: Regular rate and rhythm, no murmurs / rubs / gallops. No extremity edema. 2+ pedal pulses. No carotid bruits.  Abdomen: no tenderness, no masses palpated. No hepatosplenomegaly. Bowel sounds positive.  Musculoskeletal: no clubbing / cyanosis. No joint deformity upper and lower extremities. Good ROM, no contractures. Normal muscle tone.  Skin: Ulcer on the medial side of the right.  With purulent discharge and severe tenderness Neurologic: CN 2-12 grossly intact. Sensation intact, DTR normal. Strength 5/5 in all 4.  Psychiatric: Normal judgment and insight. Alert and oriented x 3. Normal mood.     Labs on Admission: I have personally reviewed following labs and imaging studies  CBC: Recent Labs  Lab 04/24/23 0237 04/27/23 1139  WBC 6.9 8.6  NEUTROABS 3,305 4.4  HGB 17.0 17.6*  HCT 49.8 52.0  MCV 95.2 97.7  PLT 230 237   Basic Metabolic Panel: Recent Labs  Lab 04/24/23 0237  NA 138  K 4.3  CL 105  CO2 22  GLUCOSE 102*  BUN 11  CREATININE 0.82  CALCIUM 9.3   GFR: Estimated Creatinine Clearance: 129 mL/min (by C-G formula based on SCr of 0.82 mg/dL). Liver Function Tests: Recent Labs  Lab 04/24/23 0237  AST 17  ALT 10  BILITOT 0.4  PROT 7.6   No results for input(s): "LIPASE", "AMYLASE" in the last 168 hours. No results for input(s): "AMMONIA" in the last 168 hours. Coagulation Profile: No results for input(s): "INR", "PROTIME" in the last 168 hours. Cardiac Enzymes: No results for input(s): "CKTOTAL", "CKMB", "CKMBINDEX", "TROPONINI" in the last 168 hours. BNP (last 3 results)  No results for input(s): "PROBNP" in the last 8760 hours. HbA1C: No results for input(s): "HGBA1C" in the last 72 hours. CBG: No results for input(s): "GLUCAP" in the last 168 hours. Lipid Profile: No results for input(s): "CHOL", "HDL", "LDLCALC", "TRIG",  "CHOLHDL", "LDLDIRECT" in the last 72 hours. Thyroid Function Tests: No results for input(s): "TSH", "T4TOTAL", "FREET4", "T3FREE", "THYROIDAB" in the last 72 hours. Anemia Panel: No results for input(s): "VITAMINB12", "FOLATE", "FERRITIN", "TIBC", "IRON", "RETICCTPCT" in the last 72 hours. Urine analysis:    Component Value Date/Time   COLORURINE STRAW (A) 03/29/2021 1705   APPEARANCEUR CLEAR 03/29/2021 1705   LABSPEC 1.010 03/29/2021 1705   PHURINE 6.0 03/29/2021 1705   GLUCOSEU NEGATIVE 03/29/2021 1705   HGBUR MODERATE (A) 03/29/2021 1705   BILIRUBINUR NEGATIVE 03/29/2021 1705   KETONESUR NEGATIVE 03/29/2021 1705   PROTEINUR NEGATIVE 03/29/2021 1705   UROBILINOGEN 4.0 (H) 10/09/2008 1114   NITRITE NEGATIVE 03/29/2021 1705   LEUKOCYTESUR NEGATIVE 03/29/2021 1705    Radiological Exams on Admission: No results found.  EKG: None  Assessment/Plan Active Problems:   Type 2 diabetes mellitus with peripheral neuropathy (HCC)   Osteomyelitis of great toe of right foot (HCC)   Type 2 diabetes mellitus (HCC)   Wound infection   Osteomyelitis (HCC)  (please populate well all problems here in Problem List. (For example, if patient is on BP meds at home and you resume or decide to hold them, it is a problem that needs to be her. Same for CAD, COPD, HLD and so on)  Right foot diabetic ulcer infection and abscess and recurrent osteomyelitis -Failed outpatient management -Infection disease was consulted the ED, who is to decide IV antibiotic regimen -Podiatry consulted and plan to take patient to the OR for I&D and biopsy tomorrow, n.p.o. after midnight -Pain control with home regimen p.o. morphine and oxycodone -Normal [name] study 6 months ago  IIDM -SSI for now -Hold off home PO BM meds  HTN -BP borderline low -Resume home BP meds in AM  Hx of frequent PVCs -No acute concerns, continue Metoprolol  DM neuropathy -Continue   Anxiety/depression -No acute concern, continue  amitriptyline and as needed Xanax  DVT prophylaxis: Lovenox Code Status: Full code Family Communication: Wife at bedside Disposition Plan: Sick patient, with foot infection failed outpatient management, now requiring IV ABX and inpatient podiatry procedure, expect more than 2 midnight hospital stay Consults called: Podiatry and ID Admission status: Medsurg admit   Emeline General MD [facility] Pager [phone]  04/27/2023, 12:49 PM

## 2023-04-27 NOTE — TOC Initial Note (Addendum)
Transition of Care [facility]) - Initial/Assessment Note    Patient Details  Name: Eugene Diaz MRN: [id number] Date of Birth: October 15, 1966  [facility] CM/SW Contact:    Epifanio Lesches, RN Phone Number: 04/27/2023, 7:30 PM  Clinical Narrative:                 Pt with recurrent diabetic foot ulcer /osteomyelitis. Hx of L BKA. From home alone. States has sister in Social worker and friend support. PTA independent with ADL's. DME: ramp, W/C,cane, crutches and RW.  Plan: surgery tomorrow, ? foot amputation with bone biopsy right foot.   Pt states agreeable to home health services. Doesn't won't SNF placement if needed @ d/c.  TOC team following for needs.  Expected Discharge Plan: Home w Home Health Services (vs SNF)     Patient Goals and CMS Choice     Choice offered to / list presented to : Patient      Expected Discharge Plan and Services   Discharge Planning Services: CM Consult                                          Prior Living Arrangements/Services   Lives with:: Self Patient language and need for interpreter reviewed:: Yes Do you feel safe going back to the place where you live?: Yes      Need for Family Participation in Patient Care: Yes (Comment) Care giver support system in place?: Yes (comment)   Criminal Activity/Legal Involvement Pertinent to Current Situation/Hospitalization: No - Comment as needed  Activities of Daily Living   ADL Screening (condition at time of admission) Patient's cognitive ability adequate to safely complete daily activities?: Yes Is the patient deaf or have difficulty hearing?: No Does the patient have difficulty seeing, even when wearing glasses/contacts?: No Does the patient have difficulty concentrating, remembering, or making decisions?: No Patient able to express need for assistance with ADLs?: Yes Does the patient have difficulty dressing or bathing?: No Independently performs ADLs?: Yes (appropriate for  developmental age) Does the patient have difficulty walking or climbing stairs?: Yes Weakness of Legs: Right Weakness of Arms/Hands: None  Permission Sought/Granted                  Emotional Assessment   Attitude/Demeanor/Rapport: Engaged Affect (typically observed): Accepting Orientation: : Oriented to Self, Oriented to Place, Oriented to  Time, Oriented to Situation Alcohol / Substance Use: Tobacco Use (cigars 10 a day x 1 yr) Psych Involvement: No (comment)  Admission diagnosis:  Osteomyelitis (HCC) [M86.9] Osteomyelitis of right foot, unspecified type (HCC) [M86.9] Patient Active Problem List   Diagnosis Date Noted   Osteomyelitis (HCC) 04/27/2023   GAD (generalized anxiety disorder) 10/13/2022   Cellulitis of foot, right 10/12/2022   Vaccine counseling 08/29/2022   Syncope, near 06/20/2022   Syncope and collapse 06/07/2022   CAD in native artery 06/07/2022   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) 05/23/2022   LUE weakness 05/22/2022   Wound infection 05/20/2022   Alcohol use disorder 03/03/2022   Chest pain- w atypical and typical features 03/02/2022   Tobacco abuse 03/02/2022   Essential hypertension 03/02/2022   Gangrene right third toe associated with type 2 diabetes mellitus (HCC), likely osteomyelitis 12/03/2021   Type 2 diabetes mellitus (HCC) 12/03/2021   Right foot infection    Gangrene (HCC)    Osteomyelitis of great toe  of right foot (HCC) 08/18/2021   Severe depression (HCC) 06/09/2021   Suicidal ideation 06/09/2021   Diabetic wet gangrene of the foot (HCC)    Unilateral complete BKA, left, 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   Acute blood loss anemia 01/04/2020   Asthma    Depression with anxiety    Hyperglycemia 12/17/2019   Diabetic foot ulcer (HCC)    Anemia, chronic disease 11/10/2019   Marijuana use 11/10/2019   Wound dehiscence 08/31/2019   Hyponatremia 08/14/2019   Normocytic anemia 08/14/2019   B12 deficiency 08/14/2019    Subacute osteomyelitis, left ankle and foot (HCC)    GERD (gastroesophageal reflux disease) 12/19/2018   Hepatic steatosis 06/18/2018   Gastritis, erosive    Dysphagia, idiopathic 12/27/2017   Rectal bleeding 12/27/2017   Intention tremor 06/03/2016   Left knee pain 12/04/2015   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   Diabetic neuropathy (HCC) 10/08/2014   Chest pain, rule out acute myocardial infarction 03/10/2014   Thrombocytopenia (HCC) 03/10/2014   Chronic alcohol abuse 03/10/2014   Type 2 diabetes mellitus with peripheral neuropathy (HCC)    HLD (hyperlipidemia)    PCP:  Elfredia Nevins, MD Pharmacy:   [facility] - [place], [place] - [phone] [place] [address] Phone: [phone] Fax: [phone]  [facility] - [place], [place] - [address] [place] [address] Phone: [phone] Fax: [phone]     Social Determinants of Health (SDOH) Social History: SDOH Screenings   Food Insecurity: No Food Insecurity (10/13/2022)  Housing: Low Risk  (10/13/2022)  Transportation Needs: No Transportation Needs (10/13/2022)  Utilities: Not At Risk (10/13/2022)  Depression (PHQ2-9): Low Risk  (04/24/2023)  Tobacco Use: High Risk (04/27/2023)   SDOH Interventions:     Readmission Risk Interventions    05/23/2022   12:50 PM  Readmission Risk Prevention Plan  Transportation Screening Complete  HRI or Home Care Consult Complete  Social Work Consult for Recovery Care Planning/Counseling Complete  Palliative Care Screening Not Applicable  Medication Review Oceanographer) Complete

## 2023-04-27 NOTE — ED Triage Notes (Signed)
Patient presents from home stating that Dr. Daiva Eves told him to come to the hospital today because he wants to do surgery on his foot related to osteomyelitis.

## 2023-04-28 ENCOUNTER — Encounter (HOSPITAL_COMMUNITY): Payer: Self-pay | Admitting: *Deleted

## 2023-04-28 ENCOUNTER — Inpatient Hospital Stay (HOSPITAL_COMMUNITY): Payer: Medicare HMO | Admitting: Registered Nurse

## 2023-04-28 ENCOUNTER — Other Ambulatory Visit: Payer: Self-pay

## 2023-04-28 ENCOUNTER — Inpatient Hospital Stay (HOSPITAL_COMMUNITY): Payer: Medicare HMO

## 2023-04-28 ENCOUNTER — Encounter (HOSPITAL_COMMUNITY): Payer: Self-pay | Admitting: Internal Medicine

## 2023-04-28 ENCOUNTER — Encounter (HOSPITAL_COMMUNITY)
Admission: EM | Disposition: A | Discharge status: Home Health | Payer: Self-pay | Source: Ambulatory Visit | Attending: Family Medicine

## 2023-04-28 DIAGNOSIS — E1169 Type 2 diabetes mellitus with other specified complication: Secondary | ICD-10-CM

## 2023-04-28 DIAGNOSIS — L97512 Non-pressure chronic ulcer of other part of right foot with fat layer exposed: Secondary | ICD-10-CM

## 2023-04-28 DIAGNOSIS — S98911A Complete traumatic amputation of right foot, level unspecified, initial encounter: Secondary | ICD-10-CM

## 2023-04-28 DIAGNOSIS — M86671 Other chronic osteomyelitis, right ankle and foot: Secondary | ICD-10-CM

## 2023-04-28 DIAGNOSIS — M86171 Other acute osteomyelitis, right ankle and foot: Secondary | ICD-10-CM

## 2023-04-28 DIAGNOSIS — I96 Gangrene, not elsewhere classified: Secondary | ICD-10-CM

## 2023-04-28 DIAGNOSIS — I2510 Atherosclerotic heart disease of native coronary artery without angina pectoris: Secondary | ICD-10-CM

## 2023-04-28 DIAGNOSIS — Z7984 Long term (current) use of oral hypoglycemic drugs: Secondary | ICD-10-CM

## 2023-04-28 DIAGNOSIS — E1152 Type 2 diabetes mellitus with diabetic peripheral angiopathy with gangrene: Secondary | ICD-10-CM

## 2023-04-28 HISTORY — PX: BONE BIOPSY: SHX375

## 2023-04-28 HISTORY — PX: AMPUTATION: SHX166

## 2023-04-28 LAB — GLUCOSE, CAPILLARY
Glucose-Capillary: 148 mg/dL — ABNORMAL HIGH (ref 70–99)
Glucose-Capillary: 120 mg/dL — ABNORMAL HIGH (ref 70–99)
Glucose-Capillary: 96 mg/dL (ref 70–99)
Glucose-Capillary: 97 mg/dL (ref 70–99)
Glucose-Capillary: 89 mg/dL (ref 70–99)
Glucose-Capillary: 138 mg/dL — ABNORMAL HIGH (ref 70–99)

## 2023-04-28 LAB — SURGICAL PCR SCREEN
MRSA, PCR: POSITIVE — AB
Staphylococcus aureus: POSITIVE — AB

## 2023-04-28 LAB — HEMOGLOBIN A1C
Hgb A1c MFr Bld: 5.6 % (ref 4.8–5.6)
Mean Plasma Glucose: 114 mg/dL

## 2023-04-28 LAB — AEROBIC/ANAEROBIC CULTURE W GRAM STAIN (SURGICAL/DEEP WOUND)

## 2023-04-28 SURGERY — AMPUTATION, FOOT, PARTIAL
Anesthesia: Monitor Anesthesia Care | Site: Foot | Laterality: Right

## 2023-04-28 MED ORDER — LIDOCAINE HCL (PF) 1 % IJ SOLN
INTRAMUSCULAR | Status: DC | PRN
  Administered 2023-04-28: 20 mL

## 2023-04-28 MED ORDER — LACTATED RINGERS IV SOLN: INTRAVENOUS | Status: DC

## 2023-04-28 MED ORDER — HYDROMORPHONE HCL 1 MG/ML IJ SOLN
0.2500 mg | INTRAMUSCULAR | Status: DC | PRN
  Administered 2023-04-28: 0.5 mg via INTRAVENOUS

## 2023-04-28 MED ORDER — CEFAZOLIN SODIUM-DEXTROSE 2-4 GM/100ML-% IV SOLN
INTRAVENOUS | Status: AC
  Filled 2023-04-28: qty 100

## 2023-04-28 MED ORDER — KETAMINE HCL 50 MG/5ML IJ SOSY
PREFILLED_SYRINGE | INTRAMUSCULAR | Status: AC
  Filled 2023-04-28: qty 5

## 2023-04-28 MED ORDER — MIDAZOLAM HCL 2 MG/2ML IJ SOLN
INTRAMUSCULAR | Status: DC | PRN
  Administered 2023-04-28: 2 mg via INTRAVENOUS

## 2023-04-28 MED ORDER — MIDAZOLAM HCL 2 MG/2ML IJ SOLN
INTRAMUSCULAR | Status: AC
  Filled 2023-04-28: qty 2

## 2023-04-28 MED ORDER — CHLORHEXIDINE GLUCONATE 0.12 % MT SOLN: 15.0000 mL | Freq: Once | OROMUCOSAL | Status: AC

## 2023-04-28 MED ORDER — AMISULPRIDE (ANTIEMETIC) 5 MG/2ML IV SOLN: 10.0000 mg | Freq: Once | INTRAVENOUS | Status: DC | PRN

## 2023-04-28 MED ORDER — CHLORHEXIDINE GLUCONATE CLOTH 2 % EX PADS
6.0000 | MEDICATED_PAD | Freq: Every day | CUTANEOUS | Status: DC
  Administered 2023-04-28: 6 via TOPICAL

## 2023-04-28 MED ORDER — HYDROMORPHONE HCL 1 MG/ML IJ SOLN
INTRAMUSCULAR | Status: AC
  Filled 2023-04-28: qty 1

## 2023-04-28 MED ORDER — LIDOCAINE HCL 2 % IJ SOLN
INTRAMUSCULAR | Status: AC
  Filled 2023-04-28: qty 20

## 2023-04-28 MED ORDER — MEPERIDINE HCL 25 MG/ML IJ SOLN: 6.2500 mg | INTRAMUSCULAR | Status: DC | PRN

## 2023-04-28 MED ORDER — LIDOCAINE HCL (PF) 1 % IJ SOLN
INTRAMUSCULAR | Status: AC
  Filled 2023-04-28: qty 30

## 2023-04-28 MED ORDER — SODIUM CHLORIDE 0.9 % IV SOLN
2.0000 g | INTRAVENOUS | Status: DC
  Administered 2023-04-28: 2 g via INTRAVENOUS
  Filled 2023-04-28: qty 20

## 2023-04-28 MED ORDER — FENTANYL CITRATE (PF) 100 MCG/2ML IJ SOLN
INTRAMUSCULAR | Status: AC
  Filled 2023-04-28: qty 2

## 2023-04-28 MED ORDER — PROPOFOL 500 MG/50ML IV EMUL
INTRAVENOUS | Status: DC | PRN
  Administered 2023-04-28: 100 ug/kg/min via INTRAVENOUS
  Administered 2023-04-28: 40 mg via INTRAVENOUS

## 2023-04-28 MED ORDER — ORAL CARE MOUTH RINSE: 15.0000 mL | Freq: Once | OROMUCOSAL | Status: AC

## 2023-04-28 MED ORDER — BUPIVACAINE HCL (PF) 0.5 % IJ SOLN
INTRAMUSCULAR | Status: AC
  Filled 2023-04-28: qty 30

## 2023-04-28 MED ORDER — 0.9 % SODIUM CHLORIDE (POUR BTL) OPTIME
TOPICAL | Status: DC | PRN
  Administered 2023-04-28: 1000 mL

## 2023-04-28 MED ORDER — ONDANSETRON HCL 4 MG/2ML IJ SOLN
INTRAMUSCULAR | Status: DC | PRN
  Administered 2023-04-28: 4 mg via INTRAVENOUS

## 2023-04-28 MED ORDER — KETAMINE HCL 10 MG/ML IJ SOLN
INTRAMUSCULAR | Status: DC | PRN
  Administered 2023-04-28: 25 mg via INTRAVENOUS

## 2023-04-28 MED ORDER — PROMETHAZINE HCL 25 MG/ML IJ SOLN: 6.2500 mg | INTRAMUSCULAR | Status: DC | PRN

## 2023-04-28 MED ORDER — FENTANYL CITRATE (PF) 100 MCG/2ML IJ SOLN
INTRAMUSCULAR | Status: DC | PRN
  Administered 2023-04-28 (×2): 25 ug via INTRAVENOUS
  Administered 2023-04-28: 50 ug via INTRAVENOUS

## 2023-04-28 MED ORDER — VANCOMYCIN HCL IN DEXTROSE 1-5 GM/200ML-% IV SOLN
1000.0000 mg | Freq: Two times a day (BID) | INTRAVENOUS | Status: DC
  Administered 2023-04-29 – 2023-04-30 (×3): 1000 mg via INTRAVENOUS
  Filled 2023-04-28 (×3): qty 200

## 2023-04-28 MED ORDER — CHLORHEXIDINE GLUCONATE 0.12 % MT SOLN
OROMUCOSAL | Status: AC
  Administered 2023-04-28: 15 mL via OROMUCOSAL
  Filled 2023-04-28: qty 15

## 2023-04-28 MED ORDER — VANCOMYCIN HCL 1500 MG/300ML IV SOLN
1500.0000 mg | Freq: Once | INTRAVENOUS | Status: AC
  Administered 2023-04-28: 1750 mg via INTRAVENOUS
  Filled 2023-04-28: qty 300

## 2023-04-28 SURGICAL SUPPLY — 55 items
APL PRP STRL LF DISP 70% ISPRP (MISCELLANEOUS)
APL SKNCLS STERI-STRIP NONHPOA (GAUZE/BANDAGES/DRESSINGS)
BAG COUNTER SPONGE SURGICOUNT (BAG) ×1 IMPLANT
BAG SPNG CNTER NS LX DISP (BAG) ×1
BENZOIN TINCTURE PRP APPL 2/3 (GAUZE/BANDAGES/DRESSINGS) ×1 IMPLANT
BLADE AVERAGE 25X9 (BLADE) IMPLANT
BLADE OSCILLATING /SAGITTAL (BLADE) ×1 IMPLANT
BLADE SURG 15 STRL LF DISP TIS (BLADE) IMPLANT
BLADE SURG 15 STRL SS (BLADE)
BNDG CMPR 5X4 KNIT ELC UNQ LF (GAUZE/BANDAGES/DRESSINGS) ×1
BNDG ELASTIC 4INX 5YD STR LF (GAUZE/BANDAGES/DRESSINGS) IMPLANT
BNDG ELASTIC 4X5.8 VLCR STR LF (GAUZE/BANDAGES/DRESSINGS) IMPLANT
BNDG GAUZE DERMACEA FLUFF 4 (GAUZE/BANDAGES/DRESSINGS) ×1 IMPLANT
BNDG GZE DERMACEA 4 6PLY (GAUZE/BANDAGES/DRESSINGS) ×1
CHLORAPREP W/TINT 26 (MISCELLANEOUS) IMPLANT
COVER SURGICAL LIGHT HANDLE (MISCELLANEOUS) ×1 IMPLANT
CUFF TOURN SGL QUICK 18X4 (TOURNIQUET CUFF) ×1 IMPLANT
CUFF TOURN SGL QUICK 24 (TOURNIQUET CUFF) ×1
CUFF TOURN SGL QUICK 34 (TOURNIQUET CUFF)
CUFF TRNQT CYL 24X4X16.5-23 (TOURNIQUET CUFF) IMPLANT
CUFF TRNQT CYL 34X4X40X1 (TOURNIQUET CUFF) IMPLANT
DRSG ADAPTIC 3X8 NADH LF (GAUZE/BANDAGES/DRESSINGS) IMPLANT
DRSG EMULSION OIL 3X3 NADH (GAUZE/BANDAGES/DRESSINGS) IMPLANT
ELECT REM PT RETURN 9FT ADLT (ELECTROSURGICAL) ×1
ELECTRODE REM PT RTRN 9FT ADLT (ELECTROSURGICAL) IMPLANT
GAUZE PACKING IODOFORM 1/4X15 (PACKING) ×1 IMPLANT
GAUZE PAD ABD 8X10 STRL (GAUZE/BANDAGES/DRESSINGS) ×1 IMPLANT
GAUZE SPONGE 4X4 12PLY STRL (GAUZE/BANDAGES/DRESSINGS) ×1 IMPLANT
GAUZE XEROFORM 1X8 LF (GAUZE/BANDAGES/DRESSINGS) ×1 IMPLANT
GLOVE BIO SURGEON STRL SZ8 (GLOVE) ×1 IMPLANT
GLOVE BIOGEL PI IND STRL 8 (GLOVE) ×1 IMPLANT
GOWN STRL REUS W/ TWL LRG LVL3 (GOWN DISPOSABLE) ×2 IMPLANT
GOWN STRL REUS W/TWL LRG LVL3 (GOWN DISPOSABLE) ×2
KIT BASIN OR (CUSTOM PROCEDURE TRAY) ×1 IMPLANT
KIT TURNOVER KIT B (KITS) ×1 IMPLANT
NDL PRECISIONGLIDE 27X1.5 (NEEDLE) ×1 IMPLANT
NEEDLE PRECISIONGLIDE 27X1.5 (NEEDLE) IMPLANT
NS IRRIG 1000ML POUR BTL (IV SOLUTION) ×1 IMPLANT
PACK ORTHO EXTREMITY (CUSTOM PROCEDURE TRAY) ×1 IMPLANT
PAD ARMBOARD 7.5X6 YLW CONV (MISCELLANEOUS) ×2 IMPLANT
PAD CAST 4YDX4 CTTN HI CHSV (CAST SUPPLIES) IMPLANT
PADDING CAST COTTON 4X4 STRL (CAST SUPPLIES)
SOL PREP POV-IOD 4OZ 10% (MISCELLANEOUS) ×1 IMPLANT
STAPLER VISISTAT 35W (STAPLE) IMPLANT
STRIP CLOSURE SKIN 1/2X4 (GAUZE/BANDAGES/DRESSINGS) ×1 IMPLANT
SUT ETHILON 2 0 PSLX (SUTURE) IMPLANT
SUT MON AB 5-0 PS2 18 (SUTURE) IMPLANT
SUT PROLENE 4 0 PS 2 18 (SUTURE) IMPLANT
SUT VIC AB 3-0 PS2 18 (SUTURE)
SUT VIC AB 3-0 PS2 18XBRD (SUTURE) IMPLANT
SUT VICRYL 4-0 PS2 18IN ABS (SUTURE) ×1 IMPLANT
SYR CONTROL 10ML LL (SYRINGE) ×2 IMPLANT
TOWEL GREEN STERILE (TOWEL DISPOSABLE) ×1 IMPLANT
TUBE CONNECTING 12X1/4 (SUCTIONS) ×1 IMPLANT
YANKAUER SUCT BULB TIP NO VENT (SUCTIONS) IMPLANT

## 2023-04-28 NOTE — Consult Note (Addendum)
[facility]    Date of Admission:  04/27/2023   Total days of inpatient antibiotics 0        Reason for Consult: Osteomyelitis    Active Problems:   Type 2 diabetes mellitus with peripheral neuropathy (HCC)   Osteomyelitis of great toe of right foot (HCC)   Type 2 diabetes mellitus (HCC)   Wound infection   Osteomyelitis [facility])   Assessment: 57 year old male admitted with: #Diabetic right foot osteomyelitis #Status post TMA and TAL on 2//23 with negative cultures - Patient is followed by infectious disease and podiatry.  MRI as below showed recurrent osteomyelitis possible small abscess.  Transition to Doxy and Augmentin.  Last dose was day prior to admission. - MRI of the right foot showed cellulitis around first metatarsal/plantar aspect, small abscess at the first tarsometatarsal joint, recurrent osteomyelitis in the remaining first metatarsal and medial cuneiform. - On arrival vital stable, on exam no signs of surrounding cellulitis. -Seen by podiatry.  Plan on line salvage.  OR today with proximal foot amputation and bone biopsy referral.  Recommendations: -Following OR start vancomycin and Unasyn - Follow-up or cultures  #Left lower extremity rash - Patient states that since around his sleeve site, looks like fungal rash - Start nystatin powder Microbiology:   Antibiotics:  Admitted prior to hospitalization  Cultures: Blood  Urine  Other   HPI: Eugene Diaz is a 57 y.o. 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  Patient is followed by infectious disease and podiatry for foot wound.  Underwent right TMA and TAL on 2/4//23 with Dr. Marylene Land.  Or cultures at that time with no growth.  States he has been on and off antibiotics for quite a while.  Seen by infectious disease Dr. Algis Liming on  6/24.  He was changed over to Doxy Augmentin from clindamycin and Cipro at that time.  MRI showed cellulitis around the first metatarsal, small abscess along plantar aspect of first tarsometatarsal joint.  Recurrent osteomyelitis and first metatarsal and medial cuneiform.  Podiatry recommended admission for IV antibiotics and surgical intervention.  On arrival to the ED vitals are stable.  Recommending holding antibiotics as no signs of skin soft tissue infection presently on exam.  Podiatry engaged and planning to attempt limb salvage plan on OR today.  Review of Systems: Review of Systems  All other systems reviewed and are negative.      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) 05/23/2022   Depression    Diabetic neuropathy (HCC)    Edema of both lower extremities    Essential tremor    neurologist-- dr patel--- due to alcohol abuse   GERD (gastroesophageal reflux disease)    History of cellulitis 2020   left lower leg;   recurrent 03-25-2020   History of esophageal stricture    s/p  dilatation 02-13-2018   History of osteomyelitis 2020   left great toe     Hypercholesterolemia    Hypertension    followed by pcp  (nuclear stress test 03-11-2014 low risk w/ no ischemia,  ef 65%   Normocytic anemia    followed by pcp   (03-18-2020 had transfusion's 02/ 2021)   Osteomyelitis of great toe of right foot (HCC) 08/18/2021   Peripheral vascular disease (HCC)    Severe depression (HCC) 06/09/2021   Status post incision and drainage followed by Dr Samuella Cota and pcp   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   (03-17-2020  pt states most of incision is healed with exception an area that is still draining,  daily dressing change),  foot is red but not warm to the touch and has  swelling but has improved)   Suicidal ideation 06/09/2021   Syncope and collapse 06/07/2022   Syncope, near 06/20/2022   Thrombocytopenia (HCC)    chronic   Type 2 diabetes mellitus (HCC)    followed by pcp---    (03-18-2020  pt stated does not check blood sugar)   Vaccine counseling 08/29/2022    Social History   Tobacco Use   Smoking status: Some Days    Packs/day: 2.50    Years: 25.00    Additional pack years: 0.00    Total pack years: 62.50    Types: Cigarettes, Cigars    Last attempt to quit: 08/05/1984    Years since quitting: 38.7   Smokeless tobacco: Never   Tobacco comments:    Quit smoking cigarettes; now smokes cigars, wants to quit  Vaping Use   Vaping Use: Never used  Substance Use Topics   Alcohol use: Yes    Alcohol/week: 50.0 standard drinks of alcohol    Types: 50 Standard drinks or equivalent per week    Comment: Drinks 6 beers, liquor 4-6oz (both daily) x 35 years; now states he "drinks a few beers and let it go"   Drug use: Not Currently    Types: Marijuana    Family History  Problem Relation Age of Onset   Heart attack Mother        Living, 45   Cancer Father        Deceased   Healthy Brother    Healthy Sister    Colon cancer Neg Hx    Colon polyps Neg Hx    Scheduled Meds:  ALPRAZolam  1 mg Oral QID   amitriptyline  100 mg Oral QHS   Chlorhexidine Gluconate Cloth  6 each Topical Q0600   folic acid  1 mg Oral Daily   insulin aspart  0-15 Units Subcutaneous TID WC   morphine  30 mg Oral Q12H   rosuvastatin  20 mg Oral Daily   thiamine  100 mg Oral Daily   Continuous Infusions: PRN Meds:.acetaminophen, albuterol, bisacodyl, oxycodone, senna-docusate No Known Allergies  OBJECTIVE: Blood pressure 103/64, pulse 71, temperature (!) 97.5 F (36.4 C), temperature source Oral, resp. rate 18, height 6\' 2"  (1.88 m), weight 108.9 kg, SpO2 94 %.  Physical Exam Constitutional:      General: He is not in acute distress.    Appearance: He is normal  weight. He is not toxic-appearing.  HENT:     Head: Normocephalic and atraumatic.     Right Ear: External ear normal.     Left Ear: External ear normal.     Nose: No congestion or rhinorrhea.     Mouth/Throat:     Mouth: Mucous membranes are moist.     Pharynx: Oropharynx is clear.  Eyes:     Extraocular Movements: Extraocular movements intact.     Conjunctiva/sclera: Conjunctivae normal.     Pupils:  Pupils are equal, round, and reactive to light.  Cardiovascular:     Rate and Rhythm: Normal rate and regular rhythm.     Heart sounds: No murmur heard.    No friction rub. No gallop.  Pulmonary:     Effort: Pulmonary effort is normal.     Breath sounds: Normal breath sounds.  Abdominal:     General: Abdomen is flat. Bowel sounds are normal.     Palpations: Abdomen is soft.  Musculoskeletal:        General: No swelling.     Cervical back: Normal range of motion and neck supple.     Comments: Foot wound without signs of surrounding cellulitis  Skin:    General: Skin is warm and dry.  Neurological:     General: No focal deficit present.     Mental Status: He is oriented to person, place, and time.  Psychiatric:        Mood and Affect: Mood normal.     Lab Results Lab Results  Component Value Date   WBC 8.6 04/27/2023   HGB 17.6 (H) 04/27/2023   HCT 52.0 04/27/2023   MCV 97.7 04/27/2023   PLT 237 04/27/2023    Lab Results  Component Value Date   CREATININE 1.10 04/27/2023   BUN 13 04/27/2023   NA 133 (L) 04/27/2023   K 4.4 04/27/2023   CL 101 04/27/2023   CO2 23 04/27/2023    Lab Results  Component Value Date   ALT 10 04/27/2023   AST 14 (L) 04/27/2023   ALKPHOS 91 04/27/2023   BILITOT 0.6 04/27/2023       Danelle Earthly, MD [facility] 04/28/2023, 11:37 AM    I have personally spent 82 minutes involved in face-to-face and non-face-to-face activities for this patient on the day of the visit. Professional  time spent includes the following activities: Preparing to see the patient (review of tests), Obtaining and/or reviewing separately obtained history (admission/discharge record), Performing a medically appropriate examination and/or evaluation , Ordering medications/tests/procedures, referring and communicating with other health care professionals, Documenting clinical information in the EMR, Independently interpreting results (not separately reported), Communicating results to the patient/family/caregiver, Counseling and educating the patient/family/caregiver and Care coordination (not separately reported).

## 2023-04-28 NOTE — Progress Notes (Signed)
Pharmacy Antibiotic Note  Eugene Diaz is a 57 y.o. male admitted on 04/27/2023 with R-DFI/osteo. Planning OR today for I&D and cultures.  Pharmacy has been consulted for Vancomycin + Unasyn dosing post-op.  SCr 1.1, hx L-BKA, Vancomycin 1500 mg IV x 1 dose given at 1530. Will be less aggressive with initial Vanc dosing, noted BL SCr 0.6-0.8.  Plan: - Start Vancomycin 1g IV every 12 hours (eAUC 493, Vd 0.5, SCr 1.1) - Start Rocephin 2g IV every 24 hours - Will continue to follow renal function, culture results, LOT, and antibiotic de-escalation plans   Height: 6\' 2"  (188 cm) Weight: 108.9 kg (240 lb 1.3 oz) IBW/kg (Calculated) : 82.2  Temp (24hrs), Avg:97.9 F (36.6 C), Min:97.5 F (36.4 C), Max:98.2 F (36.8 C)  Recent Labs  Lab 04/24/23 0237 04/27/23 1139  WBC 6.9 8.6  CREATININE 0.82 1.10    Estimated Creatinine Clearance: 96.2 mL/min (by C-G formula based on SCr of 1.1 mg/dL).    No Known Allergies  Thank you for allowing pharmacy to be a part of this patient's care.  Georgina Pillion, PharmD, BCPS, BCIDP Infectious Diseases Clinical Pharmacist 04/28/2023 3:52 PM   **Pharmacist phone directory can now be found on [email or web address] ([email or web address]).  Listed under [facility].

## 2023-04-28 NOTE — Brief Op Note (Signed)
04/28/2023  4:30 PM  PATIENT:  Eugene Diaz  57 y.o. male  PRE-OPERATIVE DIAGNOSIS:  osteomyelitis  POST-OPERATIVE DIAGNOSIS:  osteomyelitis  PROCEDURE:  Procedure(s): RIGHT PARTIAL FOOT AMPUTATION (Right) BONE BIOPSY (Right)  SURGEON:  Surgeon(s) and Role:    Felecia Shelling, DPM - Primary  PHYSICIAN ASSISTANT:   ASSISTANTS: none   ANESTHESIA:   local and MAC  EBL:  20 mL   BLOOD ADMINISTERED:none  DRAINS: none   LOCAL MEDICATIONS USED:  MARCAINE   , LIDOCAINE , and Amount: 20 ml  SPECIMEN: Bone biopsy RT medial cuneiform sent for BOTH culture and path  DISPOSITION OF SPECIMEN:  PATHOLOGY  COUNTS:  YES  TOURNIQUET:   Total Tourniquet Time Documented: Calf (Right) - 51 minutes Total: Calf (Right) - 51 minutes   DICTATION: .Reubin Milan Dictation  PLAN OF CARE: Admit to inpatient   PATIENT DISPOSITION:  PACU - hemodynamically stable.   Delay start of Pharmacological VTE agent (>24hrs) due to surgical blood loss or risk of bleeding: no  Felecia Shelling, DPM [facility]  Dr. Felecia Shelling, DPM    [address] [place], [place] [address]                Office [phone]  Fax [phone]

## 2023-04-28 NOTE — Anesthesia Preprocedure Evaluation (Addendum)
Anesthesia Evaluation  Patient identified by MRN, date of birth, ID band Patient awake    Reviewed: Allergy & Precautions, NPO status , Patient's Chart, lab work & pertinent test results  Airway Mallampati: II  TM Distance: >3 FB Neck ROM: Full    Dental  (+) Poor Dentition, Missing, Loose, Chipped   Pulmonary asthma , Current Smoker and Patient abstained from smoking., former smoker   breath sounds clear to auscultation       Cardiovascular hypertension, + CAD and + Peripheral Vascular Disease   Rhythm:Regular Rate:Normal  Echo 02/2022  1. Apex is hypokinetic. Left ventricular ejection fraction, by estimation, is 50 to 55%. The left ventricle has low normal function. There is moderate left ventricular hypertrophy. Left ventricular diastolic parameters are consistent with Grade II diastolic dysfunction (pseudonormalization).   2. Right ventricular systolic function is normal. The right ventricular size is normal.   3. The mitral valve was not well visualized. No evidence of mitral valve regurgitation.   4. There is mild calcification of the aortic valve. Aortic valve regurgitation is not visualized.   5. The inferior vena cava is normal in size with greater than 50% respiratory variability, suggesting right atrial pressure of 3 mmHg.   Comparison(s): No prior Echocardiogram.     Neuro/Psych  PSYCHIATRIC DISORDERS Anxiety Depression     Neuromuscular disease    GI/Hepatic Neg liver ROS, PUD,GERD  ,,  Endo/Other  diabetes    Renal/GU negative Renal ROS     Musculoskeletal  (+) Arthritis ,    Abdominal Normal abdominal exam  (+)   Peds  Hematology   Anesthesia Other Findings   Reproductive/Obstetrics                              Anesthesia Physical Anesthesia Plan  ASA: 3  Anesthesia Plan: MAC   Post-op Pain Management: Ofirmev IV (intra-op)*   Induction: Intravenous  PONV Risk Score  and Plan: 2 and Ondansetron, Treatment may vary due to age or medical condition, Propofol infusion and TIVA  Airway Management Planned:   Additional Equipment: None  Intra-op Plan:   Post-operative Plan:   Informed Consent: I have reviewed the patients History and Physical, chart, labs and discussed the procedure including the risks, benefits and alternatives for the proposed anesthesia with the patient or authorized representative who has indicated his/her understanding and acceptance.     Dental advisory given  Plan Discussed with: CRNA  Anesthesia Plan Comments:          Anesthesia Quick Evaluation

## 2023-04-28 NOTE — Transfer of Care (Signed)
Immediate Anesthesia Transfer of Care Note  Patient: [name]  Procedure(s) Performed: RIGHT PARTIAL FOOT AMPUTATION (Right: Foot) BONE BIOPSY (Right: Foot)  Patient Location: PACU  Anesthesia Type:MAC  Level of Consciousness: awake  Airway & Oxygen Therapy: Patient Spontanous Breathing  Post-op Assessment: Report given to RN and Post -op Vital signs reviewed and stable  Post vital signs: Reviewed and stable  Last Vitals:  Vitals Value Taken Time  BP 109/69 04/28/23 1628  Temp    Pulse 68 04/28/23 1629  Resp 13 04/28/23 1629  SpO2 91 % 04/28/23 1629  Vitals shown include unvalidated device data.  Last Pain:  Vitals:   04/28/23 1314  TempSrc:   PainSc: 7          Complications: No notable events documented.

## 2023-04-28 NOTE — Interval H&P Note (Signed)
History and Physical Interval Note:  04/28/2023 2:17 PM  Eugene Diaz  has presented today for surgery, with the diagnosis of osteomyelitis.  The various methods of treatment have been discussed with the patient and family. After consideration of risks, benefits and other options for treatment, the patient has consented to  Procedure(s): RIGHT PARTIAL FOOT AMPUTATION (Right) BONE BIOPSY (Right) as a surgical intervention.  The patient's history has been reviewed, patient examined, no change in status, stable for surgery.  I have reviewed the patient's chart and labs.  Questions were answered to the patient's satisfaction.     Felecia Shelling

## 2023-04-28 NOTE — Op Note (Signed)
OPERATIVE REPORT Patient name: Eugene Diaz MRN: [id number] DOB: 1966-04-13  DOS: 04/28/23  Preop Dx: Residual osteomyelitis right foot Postop Dx: same  Procedure:  1.  Tendo Achilles lengthening right 2.  Osteomyelitis right foot  Surgeon: Felecia Shelling DPM  Anesthesia: Monitored anesthesia care with 50-50 mixture of 2% lidocaine plain with 0.5% Marcaine plain totaling 20 mL infiltrated in the patient's right ankle  Hemostasis: Calf tourniquet inflated to a pressure of after esmarch exsanguination   EBL: 50 mL Materials: None Injectables: None Pathology: Medial cuneiform sent for both culture and pathology  Condition: The patient tolerated the procedure and anesthesia well. No complications noted or reported   Justification for procedure: The patient is a 57 y.o. male who presents today for surgical correction of recurrent osteomyelitis to the right foot. The patient was told benefits as well as possible side effects of the surgery. The patient consented for surgical correction. The patient consent form was reviewed. All patient questions were answered. No guarantees were expressed or implied. The patient and the surgeon both signed the patient consent form with the witness present and placed in the patient's chart.   Procedure in Detail: The patient was brought to the operating room, placed in the operating table in the supine position at which time an aseptic scrub and drape were performed about the patient's respective lower extremity after anesthesia was induced as described above. Attention was then directed to the surgical area where procedure number one commenced.  Procedure #1: Tendoachilles lengthening right 3 small percutaneous incisions were planned and made along the posterior aspect of the Achilles tendon beginning approximately 3 cm proximal to the insertion.  The most proximal and distal incisions began at the midline of the Achilles tendon and extended  medially while the central incision began in the midline and extended laterally, essentially creating an accordion type lengthening of the Achilles.  Primary closure achieved using 4-0 Prolene  Procedure #2: Chopart amputation right foot A fishmouth type incision was planned and made around the right midfoot.  Incision was carried directly down to the level of bone and joint around the talonavicular and calcaneocuboid using a #10 scalpel.  The foot distal to the talonavicular and calcaneocuboid joint was distracted and disarticulated at this level.  Clean margins identified and noted.  The remaining soft tissue of the foot.  Healthy and viable.  Copious irrigation was utilized in preparation for primary closure.  Any exposed tendons were distracted distally and cut as far proximal as could be visualized.  Any vessels or lumens were also identified and either cauterized or tied.  Primary closure was achieved using combination of stainless steel skin staples and 2-0 nylon suture.  Dry sterile compressive dressings were then applied to all previously mentioned incision sites about the patient's lower extremity. The tourniquet which was used for hemostasis was deflated.  The patient was then transferred from the operating room to the recovery room having tolerated the procedure and anesthesia well. All vital signs are stable. After a brief stay in the recovery room the patient was readmitted to inpatient room with postoperative orders placed.    IMPRESSION: Clean margins.  Based on intraoperative evaluation and MRI, suspect surgical cure of the residual osteomyelitis within the foot.  After closure of the amputation site, I did decide to harvest a portion of the medial cuneiform of the amputated foot where a portion of the medial cuneiform was harvested and 2 separate specimens were sent for both pathology  and culture.  I do not suspect that the patient will require long-term IV antibiotics since the residual  osteomyelitis was completely excised.  Felecia Shelling, DPM [facility]  Dr. Felecia Shelling, DPM    [address] [place], [place] [address]                Office [phone]  Fax [phone]

## 2023-04-28 NOTE — Progress Notes (Signed)
PROGRESS NOTE   LIGHT FLORIAN  NWG:[id number] DOB: 11/22/1965 DOA: 04/27/2023 PCP: Elfredia Nevins, MD   Date of Service: the patient was seen and examined on 04/28/2023  Brief Narrative:  No notes on file   Assessment and Plan:  Right foot diabetic ulcer infection and abscess Recurrent osteomyelitis Unfortunately failed antibiotic management as an outpatient.  MRI foot suggestive of first metatarsal cellulitis and small abscess along plantar aspect of tarsometatarsal joint, recurrent osteomyelitis in the first metatarsal -ID consulted on admission-Dr. M. Singh, recommend as long as no systemic infection, we hold off ABX before podiatry procedure. -Podiatry following-patient is high risk for BKA right lower extremity surgery for proximal foot amputation with bone biopsy later this afternoon -Pain control with home regimen p.o. morphine and oxycodone -Holding Lovenox prior to surgery, patient did receive 1 dose yesterday at 2 PM  IIDM CBGs 148, 147 -Continue SSI for now -CBGs 4 times daily with meals and at bedtime -Hold home diabetic medications-metformin   HTN BP 103/64, blood pressures have been soft on admission -Hold amlodipine and lisinopril - Restart as able   Hx of frequent PVCs -Continue Metoprolol   DM neuropathy -Continue amitriptyline   Anxiety/depression -Continue amitriptyline and Xanax  Hyperlipidemia -Continue Crestor  Subjective:  No acute concerns.   Physical Exam:  Vitals:   04/27/23 1338 04/27/23 1932 04/28/23 0532 04/28/23 0741  BP: 107/68 105/71 116/75 103/64  Pulse: 79 75 70 71  Resp: 19 18 16 18   Temp: 97.7 F (36.5 C) 98.2 F (36.8 C) 98.1 F (36.7 C) (!) 97.5 F (36.4 C)  TempSrc: Oral  Oral Oral  SpO2: 97% 92% 100% 94%  Weight:      Height:        General: Alert, no acute distress Cardio: well perfused Pulm: normal work of breathing Abdomen: Bowel sounds normal. Abdomen soft and non-tender.  Extremities: left BKA, right  foot in dressing  Neuro: Cranial nerves grossly intact  Data Reviewed:  I have personally reviewed and interpreted labs, imaging.   CBC: Recent Labs  Lab 04/24/23 0237 04/27/23 1139  WBC 6.9 8.6  NEUTROABS 3,305 4.4  HGB 17.0 17.6*  HCT 49.8 52.0  MCV 95.2 97.7  PLT 230 237   Basic Metabolic Panel: Recent Labs  Lab 04/24/23 0237 04/27/23 1139  NA 138 133*  K 4.3 4.4  CL 105 101  CO2 22 23  GLUCOSE 102* 121*  BUN 11 13  CREATININE 0.82 1.10  CALCIUM 9.3 9.2   GFR: Estimated Creatinine Clearance: 96.2 mL/min (by C-G formula based on SCr of 1.1 mg/dL). Liver Function Tests: Recent Labs  Lab 04/24/23 0237 04/27/23 1139  AST 17 14*  ALT 10 10  ALKPHOS  --  91  BILITOT 0.4 0.6  PROT 7.6 7.7  ALBUMIN  --  3.7    Coagulation Profile: No results for input(s): "INR", "PROTIME" in the last 168 hours.   Code Status:  Full code.  Code status decision has been confirmed with: patient  Family Communication:     Severity of Illness:  The appropriate patient status for this patient is INPATIENT. Inpatient status is judged to be reasonable and necessary in order to provide the required intensity of service to ensure the patient's safety. The patient's presenting symptoms, physical exam findings, and initial radiographic and laboratory data in the context of their chronic comorbidities is felt to place them at high risk for further clinical deterioration. Furthermore, it is not anticipated that the patient will  be medically stable for discharge from the hospital within 2 midnights of admission.   * I certify that at the point of admission it is my clinical judgment that the patient will require inpatient hospital care spanning beyond 2 midnights from the point of admission due to high intensity of service, high risk for further deterioration and high frequency of surveillance required.*   Author:  Rolm Gala MD  04/28/2023 7:48 AM

## 2023-04-28 NOTE — Plan of Care (Signed)
  Problem: Education: Goal: Ability to describe self-care measures that may prevent or decrease complications (Diabetes Survival Skills Education) will improve Outcome: Progressing   Problem: Coping: Goal: Ability to adjust to condition or change in health will improve Outcome: Progressing   Problem: Fluid Volume: Goal: Ability to maintain a balanced intake and output will improve Outcome: Progressing   

## 2023-04-29 DIAGNOSIS — M869 Osteomyelitis, unspecified: Secondary | ICD-10-CM | POA: Diagnosis not present

## 2023-04-29 LAB — AEROBIC/ANAEROBIC CULTURE W GRAM STAIN (SURGICAL/DEEP WOUND): Culture: NO GROWTH

## 2023-04-29 LAB — BASIC METABOLIC PANEL
Anion gap: 7 (ref 5–15)
BUN: 16 mg/dL (ref 6–20)
CO2: 25 mmol/L (ref 22–32)
Calcium: 9.1 mg/dL (ref 8.9–10.3)
Chloride: 104 mmol/L (ref 98–111)
Creatinine, Ser: 0.98 mg/dL (ref 0.61–1.24)
GFR, Estimated: >60 mL/min (ref >60)
Glucose, Bld: 133 mg/dL — ABNORMAL HIGH (ref 70–99)
Potassium: 4.4 mmol/L (ref 3.5–5.1)
Sodium: 136 mmol/L (ref 135–145)

## 2023-04-29 LAB — CBC
HCT: 46.3 % (ref 39.0–52.0)
Hemoglobin: 15.1 g/dL (ref 13.0–17.0)
MCH: 32 pg (ref 26.0–34.0)
MCHC: 32.6 g/dL (ref 30.0–36.0)
MCV: 98.1 fL (ref 80.0–100.0)
Platelets: 183 10*3/uL (ref 150–400)
RBC: 4.72 MIL/uL (ref 4.22–5.81)
RDW: 15.4 % (ref 11.5–15.5)
WBC: 7.6 10*3/uL (ref 4.0–10.5)
nRBC: 0 % (ref 0.0–0.2)

## 2023-04-29 LAB — GLUCOSE, CAPILLARY
Glucose-Capillary: 150 mg/dL — ABNORMAL HIGH (ref 70–99)
Glucose-Capillary: 119 mg/dL — ABNORMAL HIGH (ref 70–99)
Glucose-Capillary: 119 mg/dL — ABNORMAL HIGH (ref 70–99)
Glucose-Capillary: 128 mg/dL — ABNORMAL HIGH (ref 70–99)

## 2023-04-29 MED ORDER — HYDROMORPHONE HCL 1 MG/ML IJ SOLN: 0.5000 mg | INTRAMUSCULAR | Status: DC | PRN

## 2023-04-29 MED ORDER — AMLODIPINE BESYLATE 10 MG PO TABS
10.0000 mg | ORAL_TABLET | Freq: Every day | ORAL | Status: DC
  Administered 2023-04-29 – 2023-05-01 (×3): 10 mg via ORAL
  Filled 2023-04-29 (×3): qty 1

## 2023-04-29 MED ORDER — HYDROMORPHONE HCL 1 MG/ML IJ SOLN
INTRAMUSCULAR | Status: AC
  Filled 2023-04-29: qty 0.5

## 2023-04-29 MED ORDER — SODIUM CHLORIDE 0.9 % IV SOLN: INTRAVENOUS | Status: DC | PRN

## 2023-04-29 MED ORDER — ENOXAPARIN SODIUM 40 MG/0.4ML IJ SOSY
40.0000 mg | PREFILLED_SYRINGE | INTRAMUSCULAR | Status: DC
  Administered 2023-04-29 – 2023-04-30 (×2): 40 mg via SUBCUTANEOUS
  Filled 2023-04-29 (×2): qty 0.4

## 2023-04-29 MED ORDER — SODIUM CHLORIDE 0.9 % IV SOLN
3.0000 g | Freq: Four times a day (QID) | INTRAVENOUS | Status: DC
  Administered 2023-04-29 – 2023-04-30 (×4): 3 g via INTRAVENOUS
  Filled 2023-04-29 (×4): qty 8

## 2023-04-29 MED ORDER — NALOXONE HCL 0.4 MG/ML IJ SOLN: 0.4000 mg | INTRAMUSCULAR | Status: DC | PRN

## 2023-04-29 NOTE — Progress Notes (Signed)
   04/29/23 0729  Pain Assessment  Pain Scale 0-10  Pain Score 9  Pain Type Surgical pain  Pain Location Leg  Pain Orientation Right  Pain Descriptors / Indicators Aching;Sore  Pain Frequency Constant  Pain Onset On-going  Pain Intervention(s) MD notified (Comment) (Dr. Allena Katz via Secure Chat)  Provider Notification  Provider Name/Title Dr. Patel/ Attending  Date Provider Notified 04/29/23  Time Provider Notified [phone]  Method of Notification  (Secure Chat)  Notification Reason Requested by patient/family (Pain not controlled)

## 2023-04-29 NOTE — Progress Notes (Signed)
Pharmacy Antibiotic Note  Eugene Diaz is a 57 y.o. male admitted on 04/27/2023 with R-DFI/osteo. Planning OR today for I&D and cultures.  Pharmacy has been consulted for Vancomycin + Unasyn dosing. Previously on ceftriaxone - last dose 6/28 PM.  SCr down to 0.98, hx L-BKA, noted BL SCr 0.6-0.8.  Plan: Unasyn 3g IV q6h Vancomycin 1g IV q12h (eAUC 439, Vd 0.5, SCr 0.98) Monitor clinical progress, c/s, renal function F/u de-escalation plan/LOT, vancomycin levels as indicated   Height: 6\' 2"  (188 cm) Weight: 108.9 kg (240 lb 1.3 oz) IBW/kg (Calculated) : 82.2  Temp (24hrs), Avg:98.1 F (36.7 C), Min:97.7 F (36.5 C), Max:98.7 F (37.1 C)  Recent Labs  Lab 04/24/23 0237 04/27/23 1139 04/29/23 0306  WBC 6.9 8.6 7.6  CREATININE 0.82 1.10 0.98     Estimated Creatinine Clearance: 108 mL/min (by C-G formula based on SCr of 0.98 mg/dL).    No Known Allergies  Leia Alf, PharmD, BCPS Please check AMION for all [facility] contact numbers Clinical Pharmacist 04/29/2023 8:24 AM

## 2023-04-29 NOTE — Progress Notes (Signed)
PROGRESS NOTE   Eugene Diaz  ZOX:[id number] DOB: 01/01/1966 DOA: 04/27/2023 PCP: Elfredia Nevins, MD   Date of Service: the patient was seen and examined on 04/29/2023  Brief Narrative:   Eugene Diaz is a 57 y.o. male with medical history significant of chronic right foot diabetic ulcer infection, IIDM, HTN, frequent PVCs, HLD, diabetic neuropathy, anxiety/depression, left BKA, sent from infection disease office for evaluation of worsening of right foot infection.   Assessment and Plan:  Right foot diabetic ulcer infection and abscess Recurrent osteomyelitis Failed antibiotic management as an outpatient.  MRI foot suggestive of first metatarsal cellulitis and small abscess along plantar aspect of tarsometatarsal joint, recurrent osteomyelitis in the first metatarsal.  Postop day 1 partial amputation of right foot -ID consulted: Start vancomycin and ceftriaxone, follow-up wound cultures -Podiatry signed off-anticipate surgical cure of the osteomyelitis, d/c 1 week oral abx, leave clean dry and intact until follow-up in the office one week from Monday, 05/08/2023.  -Pain control: Tylenol, home p.o. morphine and oxycodone, Dilaudid -Lovenox for DVT prophylaxis given hemoglobin is stable at 15 this morning -PT OT  Diabetes mellitus CBGs 150 -Continue SSI for now -CBGs 4 times daily with meals and at bedtime -Hold home diabetic medications-metformin   HTN BP 143/82.  Initially both antihypertensives held on admission due to soft blood pressures -Restart amlodipine -Hold and lisinopril   Hx of frequent PVCs -Continue Metoprolol   DM neuropathy -Continue amitriptyline   Anxiety/depression -Continue amitriptyline and Xanax  Hyperlipidemia -Continue Crestor  Subjective:  Doing well feeling sleepy. No acute concerns.   Physical Exam:  Vitals:   04/28/23 1941 04/29/23 0013 04/29/23 0414 04/29/23 0754  BP: 118/72 122/73 126/74 (!) 143/82  Pulse: 73 81 86 98  Resp: 18 18   18   Temp: 97.8 F (36.6 C) 97.8 F (36.6 C)  98.1 F (36.7 C)  TempSrc: Oral Oral  Oral  SpO2: 94% 93% 94% 94%  Weight:      Height:        General: Alert, no acute distress Cardio: well perfused Pulm: normal work of breathing Abdomen: Bowel sounds normal. Abdomen soft and non-tender.  Extremities: left BKA, right foot in dressing  Neuro: Cranial nerves grossly intact  Data Reviewed:  I have personally reviewed and interpreted labs, imaging.   CBC: Recent Labs  Lab 04/24/23 0237 04/27/23 1139 04/29/23 0306  WBC 6.9 8.6 7.6  NEUTROABS 3,305 4.4  --   HGB 17.0 17.6* 15.1  HCT 49.8 52.0 46.3  MCV 95.2 97.7 98.1  PLT 230 237 183    Basic Metabolic Panel: Recent Labs  Lab 04/24/23 0237 04/27/23 1139 04/29/23 0306  NA 138 133* 136  K 4.3 4.4 4.4  CL 105 101 104  CO2 22 23 25   GLUCOSE 102* 121* 133*  BUN 11 13 16   CREATININE 0.82 1.10 0.98  CALCIUM 9.3 9.2 9.1    GFR: Estimated Creatinine Clearance: 108 mL/min (by C-G formula based on SCr of 0.98 mg/dL). Liver Function Tests: Recent Labs  Lab 04/24/23 0237 04/27/23 1139  AST 17 14*  ALT 10 10  ALKPHOS  --  91  BILITOT 0.4 0.6  PROT 7.6 7.7  ALBUMIN  --  3.7     Coagulation Profile: No results for input(s): "INR", "PROTIME" in the last 168 hours.   Code Status:  Full code.  Code status decision has been confirmed with: patient  Family Communication:     Severity of Illness:  The appropriate patient  status for this patient is INPATIENT. Inpatient status is judged to be reasonable and necessary in order to provide the required intensity of service to ensure the patient's safety. The patient's presenting symptoms, physical exam findings, and initial radiographic and laboratory data in the context of their chronic comorbidities is felt to place them at high risk for further clinical deterioration. Furthermore, it is not anticipated that the patient will be medically stable for discharge from the  hospital within 2 midnights of admission.   * I certify that at the point of admission it is my clinical judgment that the patient will require inpatient hospital care spanning beyond 2 midnights from the point of admission due to high intensity of service, high risk for further deterioration and high frequency of surveillance required.*   Author:  Rolm Gala MD  04/29/2023 7:58 AM

## 2023-04-29 NOTE — Evaluation (Signed)
Physical Therapy Evaluation Patient Details Name: Eugene Diaz MRN: [id number] DOB: [birth year] Today's Date: 04/29/2023  History of Present Illness  Pt is a 57 y.o. M who presents 04/27/2023 with worsening infection of right foot, now s/p partial foot amputation and tendo achilles tendon lengthening 6/28. Significant PMH: left BKA, diabetic neuropathy, PVD, depression, type 2 diabetes mellitus.  Clinical Impression  PTA, pt lives alone in a ramped entrance mobile home and is independent. Pt pleasant and agreeable to participate; appears to have good pain control. Education provided regarding weightbearing precautions/restrictions. LLE prosthetic donned for session. Pt requiring min assist for stand pivot transfers from bed to chair. Able to offload R foot completely throughout with min cueing. Will benefit from further transfer training prior to d/c. Recommend HH safety evaluation at d/c.     Recommendations for follow up therapy are one component of a multi-disciplinary discharge planning process, led by the attending physician.  Recommendations may be updated based on patient status, additional functional criteria and insurance authorization.  Follow Up Recommendations       Assistance Recommended at Discharge PRN  Patient can return home with the following  A little help with walking and/or transfers;A little help with bathing/dressing/bathroom;Assistance with cooking/housework;Assist for transportation;Help with stairs or ramp for entrance    Equipment Recommendations None recommended by PT (pt equipped)  Recommendations for Other Services       Functional Status Assessment Patient has had a recent decline in their functional status and demonstrates the ability to make significant improvements in function in a reasonable and predictable amount of time.     Precautions / Restrictions Precautions Precautions: Fall;Other (comment) Precaution Comments: prior L  BKA Restrictions Weight Bearing Restrictions: Yes LLE Weight Bearing: Partial weight bearing LLE Partial Weight Bearing Percentage or Pounds: 50 Other Position/Activity Restrictions: 50% WB for transfers only      Mobility  Bed Mobility Overal bed mobility: Needs Assistance Bed Mobility: Supine to Sit     Supine to sit: Supervision     General bed mobility comments: Supervision for safety    Transfers Overall transfer level: Needs assistance Equipment used: Rolling walker (2 wheels) Transfers: Sit to/from Stand, Bed to chair/wheelchair/BSC Sit to Stand: Min assist Stand pivot transfers: Min assist         General transfer comment: Cues for placing RLE anteriorly to prevent WB, minA to boost up and pivot towards left to chair. Keeping R foot off ground throughout, good use of upper body for transfer    Ambulation/Gait                  Stairs            Wheelchair Mobility    Modified Rankin (Stroke Patients Only)       Balance Overall balance assessment: Needs assistance Sitting-balance support: Feet supported Sitting balance-Leahy Scale: Good     Standing balance support: Bilateral upper extremity supported Standing balance-Leahy Scale: Poor Standing balance comment: reliant on RW due to weightbearing precautions                             Pertinent Vitals/Pain Pain Assessment Pain Assessment: Faces Faces Pain Scale: Hurts a little bit Pain Location: incisional Pain Descriptors / Indicators: Operative site guarding Pain Intervention(s): Monitored during session, Premedicated before session    Home Living Family/patient expects to be discharged to:: Private residence Living Arrangements: Alone Available Help at Discharge: Family;Available PRN/intermittently Type of  Home: Mobile home Home Access: Ramped entrance       Home Layout: One level Home Equipment: Agricultural consultant (2 wheels);Crutches;BSC/3in1;Wheelchair -  manual;Cane - single point;Other (comment) (knee scooter)      Prior Function Prior Level of Function : Independent/Modified Independent                     Hand Dominance   Dominant Hand: Left    Extremity/Trunk Assessment   Upper Extremity Assessment Upper Extremity Assessment: Defer to OT evaluation    Lower Extremity Assessment Lower Extremity Assessment: RLE deficits/detail;LLE deficits/detail RLE Deficits / Details: s/p partial amputation, hip/knee WFL LLE Deficits / Details: prior BKA, hip/knee WFL    Cervical / Trunk Assessment Cervical / Trunk Assessment: Normal  Communication   Communication: No difficulties  Cognition Arousal/Alertness: Awake/alert Behavior During Therapy: WFL for tasks assessed/performed Overall Cognitive Status: Impaired/Different from baseline Area of Impairment: Following commands, Awareness, Problem solving                       Following Commands: Follows multi-step commands inconsistently   Awareness: Emergent Problem Solving: Slow processing, Difficulty sequencing, Requires verbal cues, Requires tactile cues General Comments: Sleepy but pleasant        General Comments      Exercises     Assessment/Plan    PT Assessment Patient needs continued PT services  PT Problem List Decreased strength;Decreased activity tolerance;Decreased balance;Decreased mobility;Pain       PT Treatment Interventions DME instruction;Gait training;Therapeutic activities;Functional mobility training;Therapeutic exercise;Balance training;Patient/family education    PT Goals (Current goals can be found in the Care Plan section)  Acute Rehab PT Goals Patient Stated Goal: agreeable to out of bed mobility PT Goal Formulation: With patient Time For Goal Achievement: 05/13/23 Potential to Achieve Goals: Good    Frequency Min 3X/week     Co-evaluation               AM-PAC PT "6 Clicks" Mobility  Outcome Measure Help needed  turning from your back to your side while in a flat bed without using bedrails?: None Help needed moving from lying on your back to sitting on the side of a flat bed without using bedrails?: A Little Help needed moving to and from a bed to a chair (including a wheelchair)?: A Little Help needed standing up from a chair using your arms (e.g., wheelchair or bedside chair)?: A Little Help needed to walk in hospital room?: A Little Help needed climbing 3-5 steps with a railing? : Total 6 Click Score: 17    End of Session Equipment Utilized During Treatment: Gait belt Activity Tolerance: Patient tolerated treatment well Patient left: in chair;with call bell/phone within reach;with chair alarm set Nurse Communication: Mobility status PT Visit Diagnosis: Unsteadiness on feet (R26.81);Other abnormalities of gait and mobility (R26.89);Pain Pain - Right/Left: Right Pain - part of body: Ankle and joints of foot    Time: 1610-9604 PT Time Calculation (min) (ACUTE ONLY): 22 min   Charges:   PT Evaluation $PT Eval Low Complexity: 1 Low          Lillia Pauls, PT, DPT Acute Rehabilitation Services Office [phone]   Norval Morton 04/29/2023, 3:08 PM

## 2023-04-29 NOTE — Evaluation (Signed)
Occupational Therapy Evaluation Patient Details Name: Eugene Diaz MRN: [id number] DOB: 1966/05/11 Today's Date: 04/29/2023   History of Present Illness Pt is a 57 y.o. M who presents 04/27/2023 with worsening infection of right foot, now s/p partial foot amputation and tendo achilles tendon lengthening 6/28. Significant PMH: left BKA, diabetic neuropathy, PVD, depression, type 2 diabetes mellitus.   Clinical Impression   Pt admitted for above dx, PTA patient lived alone and reports ambulating with SPC or crutches and being independent/ mod I for bADLs. Pt currently presenting with impaired balance and decreased activity tolerance due to RLE WB restrictions. Pt Min guard for STS but needing Mod A to correct LOB, he can don/doff his prosthetic and complete seated bADLs with Mod I. Pt would benefit from continued acute skilled OT services to address deficits and assess safety with tub transfer, recommend tub bench regardless to reduce risk of fall. Patient would benefit from post acute Home OT services to help maximize functional independence in natural environment      Recommendations for follow up therapy are one component of a multi-disciplinary discharge planning process, led by the attending physician.  Recommendations may be updated based on patient status, additional functional criteria and insurance authorization.   Assistance Recommended at Discharge Set up Supervision/Assistance  Patient can return home with the following A little help with walking and/or transfers;Assistance with cooking/housework;Assist for transportation    Functional Status Assessment  Patient has had a recent decline in their functional status and demonstrates the ability to make significant improvements in function in a reasonable and predictable amount of time.  Equipment Recommendations  Tub/shower bench    Recommendations for Other Services       Precautions / Restrictions Precautions Precautions:  Fall;Other (comment) Precaution Comments: prior L BKA Restrictions Weight Bearing Restrictions: Yes RLE Weight Bearing: Partial weight bearing RLE Partial Weight Bearing Percentage or Pounds: 50 LLE Weight Bearing: Partial weight bearing LLE Partial Weight Bearing Percentage or Pounds: 50 Other Position/Activity Restrictions: 50% WB for transfers only      Mobility Bed Mobility Overal bed mobility: Needs Assistance Bed Mobility: Sit to Supine       Sit to supine: Supervision   General bed mobility comments: pt received sitting in recliner    Transfers Overall transfer level: Needs assistance Equipment used: Rolling walker (2 wheels) Transfers: Sit to/from Stand, Bed to chair/wheelchair/BSC Sit to Stand: Min guard Stand pivot transfers: Min assist         General transfer comment: Pt had a lateral LOB upon standing, needed Mod A to recover      Balance Overall balance assessment: Needs assistance Sitting-balance support: Feet supported Sitting balance-Leahy Scale: Good     Standing balance support: Bilateral upper extremity supported Standing balance-Leahy Scale: Poor Standing balance comment: reliant on RW                           ADL either performed or assessed with clinical judgement   ADL Overall ADL's : Needs assistance/impaired Eating/Feeding: Independent;Sitting   Grooming: Sitting;Set up   Upper Body Bathing: Sitting;Independent   Lower Body Bathing: Sitting/lateral leans;Set up   Upper Body Dressing : Sitting;Independent   Lower Body Dressing: Sitting/lateral leans;Modified independent   Toilet Transfer: Min guard;Rolling walker (2 wheels);Stand-pivot;BSC/3in1   Toileting- Architect and Hygiene: Sitting/lateral lean;Minimal assistance       Functional mobility during ADLs: Min guard;Rolling walker (2 wheels) (pivoted) General ADL Comments: Pt limited to  transfers only due to limited WB s/p procedure       Pertinent Vitals/Pain Pain Assessment Pain Assessment: Faces Faces Pain Scale: Hurts a little bit Pain Location: incisional Pain Descriptors / Indicators: Operative site guarding, Discomfort Pain Intervention(s): Monitored during session, Repositioned     Hand Dominance Left   Extremity/Trunk Assessment Upper Extremity Assessment Upper Extremity Assessment: Overall WFL for tasks assessed   Lower Extremity Assessment Lower Extremity Assessment: RLE deficits/detail;LLE deficits/detail RLE Deficits / Details: s/p partial amputation, hip/knee WFL LLE Deficits / Details: prior BKA, hip/knee WFL   Cervical / Trunk Assessment Cervical / Trunk Assessment: Normal   Communication Communication Communication: No difficulties   Cognition Arousal/Alertness: Awake/alert Behavior During Therapy: WFL for tasks assessed/performed Overall Cognitive Status: Within Functional Limits for tasks assessed                                       General Comments  VSS on RA     Home Living Family/patient expects to be discharged to:: Private residence Living Arrangements: Alone Available Help at Discharge: Family;Available PRN/intermittently Type of Home: Mobile home Home Access: Ramped entrance     Home Layout: One level     Bathroom Shower/Tub: Chief Strategy Officer: Standard Bathroom Accessibility: Yes   Home Equipment: Agricultural consultant (2 wheels);Crutches;BSC/3in1;Wheelchair - manual;Cane - single point;Other (comment) (knee scooter)          Prior Functioning/Environment Prior Level of Function : Independent/Modified Independent             Mobility Comments: Mod I using SPC and crutches ADLs Comments: ind        OT Problem List: Impaired balance (sitting and/or standing)      OT Treatment/Interventions: Self-care/ADL training;Patient/family education;Therapeutic activities;Balance training    OT Goals(Current goals can be found  in the care plan section) Acute Rehab OT Goals Patient Stated Goal: To go home OT Goal Formulation: With patient Time For Goal Achievement: 05/13/23 Potential to Achieve Goals: Good ADL Goals Pt Will Transfer to Toilet: ambulating;with supervision Pt Will Perform Tub/Shower Transfer: with supervision Additional ADL Goal #2: Pt will verbalize/demonstrate understanding of RLE WB precautions without any cueing  OT Frequency: Min 2X/week    AM-PAC OT "6 Clicks" Daily Activity     Outcome Measure Help from another person eating meals?: None Help from another person taking care of personal grooming?: A Little Help from another person toileting, which includes using toliet, bedpan, or urinal?: A Little Help from another person bathing (including washing, rinsing, drying)?: A Little Help from another person to put on and taking off regular upper body clothing?: None Help from another person to put on and taking off regular lower body clothing?: None 6 Click Score: 21   End of Session Equipment Utilized During Treatment: Rolling walker (2 wheels);Other (comment) (prosthetic) Nurse Communication: Mobility status  Activity Tolerance: Patient tolerated treatment well Patient left: in bed;with call bell/phone within reach;with bed alarm set  OT Visit Diagnosis: Unsteadiness on feet (R26.81);Other abnormalities of gait and mobility (R26.89)                Time: 1610-9604 OT Time Calculation (min): 16 min Charges:  OT General Charges $OT Visit: 1 Visit OT Evaluation $OT Eval Low Complexity: 1 Low  04/29/2023  AB, OTR/L  Acute Rehabilitation Services  Office: [phone]   Tristan Schroeder 04/29/2023, 4:52 PM

## 2023-04-29 NOTE — Progress Notes (Signed)
   Subjective: 1 Day Post-Op Procedure(s) (LRB): RIGHT PARTIAL FOOT AMPUTATION (Right) BONE BIOPSY (Right) DOS:   Objective: Vital signs in last 24 hours: Temp:  [97.7 F (36.5 C)-98.7 F (37.1 C)] 98.1 F (36.7 C) (06/29 0754) Pulse Rate:  [63-98] 98 (06/29 0754) Resp:  [10-21] 18 (06/29 0754) BP: (101-143)/(59-93) 143/82 (06/29 0754) SpO2:  [92 %-96 %] 94 % (06/29 0754)  Recent Labs    04/27/23 1139 04/29/23 0306  HGB 17.6* 15.1   Recent Labs    04/27/23 1139 04/29/23 0306  WBC 8.6 7.6  RBC 5.32 4.72  HCT 52.0 46.3  PLT 237 183   Recent Labs    04/27/23 1139 04/29/23 0306  NA 133* 136  K 4.4 4.4  CL 101 104  CO2 23 25  BUN 13 16  CREATININE 1.10 0.98  GLUCOSE 121* 133*  CALCIUM 9.2 9.1   No results for input(s): "LABPT", "INR" in the last 72 hours.  Physical Exam: Incisions well coapted.  No active bleeding.  Staples intact.  No erythema or edema noted to the foot.  Overall well-healing surgical amputation site  Assessment/Plan: 1 Day Post-Op Procedure(s) (LRB): RIGHT PARTIAL FOOT AMPUTATION (Right) BONE BIOPSY (Right) [place] Amputation RT DOS: 04/28/2023  -Dressings changed.  Leave clean dry and intact until follow-up in the office one week from Monday, 05/08/2023.  -LT BKA.  Cannot expect NWB to RLE.  Minimal  WBAT for transition purposes and bathroom privileges only. -Anticipate surgical cure of the osteomyelitis. No need for outpatient PICC or IV abx. Recommend DC 1 week oral abx -Podiatry will sign off   Felecia Shelling 04/29/2023, 12:02 PM  Felecia Shelling, DPM [facility]  Dr. Felecia Shelling, DPM    [address] [place], [place] [address]                Office [phone]  Fax [phone]

## 2023-04-30 ENCOUNTER — Encounter (HOSPITAL_COMMUNITY): Payer: Self-pay | Admitting: Podiatry

## 2023-04-30 DIAGNOSIS — M869 Osteomyelitis, unspecified: Secondary | ICD-10-CM | POA: Diagnosis not present

## 2023-04-30 LAB — CBC
HCT: 45.8 % (ref 39.0–52.0)
Hemoglobin: 14.9 g/dL (ref 13.0–17.0)
MCH: 31.8 pg (ref 26.0–34.0)
MCHC: 32.5 g/dL (ref 30.0–36.0)
MCV: 97.9 fL (ref 80.0–100.0)
Platelets: 179 10*3/uL (ref 150–400)
RBC: 4.68 MIL/uL (ref 4.22–5.81)
RDW: 15.2 % (ref 11.5–15.5)
WBC: 6.5 10*3/uL (ref 4.0–10.5)
nRBC: 0 % (ref 0.0–0.2)

## 2023-04-30 LAB — GLUCOSE, CAPILLARY
Glucose-Capillary: 152 mg/dL — ABNORMAL HIGH (ref 70–99)
Glucose-Capillary: 151 mg/dL — ABNORMAL HIGH (ref 70–99)
Glucose-Capillary: 146 mg/dL — ABNORMAL HIGH (ref 70–99)
Glucose-Capillary: 161 mg/dL — ABNORMAL HIGH (ref 70–99)

## 2023-04-30 LAB — CULTURE, BLOOD (ROUTINE X 2)
Culture: NO GROWTH
Culture: NO GROWTH

## 2023-04-30 MED ORDER — DOXYCYCLINE HYCLATE 100 MG PO TABS
100.0000 mg | ORAL_TABLET | Freq: Two times a day (BID) | ORAL | Status: DC
  Administered 2023-04-30 – 2023-05-01 (×3): 100 mg via ORAL
  Filled 2023-04-30 (×3): qty 1

## 2023-04-30 MED ORDER — AMOXICILLIN-POT CLAVULANATE 875-125 MG PO TABS
1.0000 | ORAL_TABLET | Freq: Two times a day (BID) | ORAL | Status: DC
  Administered 2023-04-30 – 2023-05-01 (×3): 1 via ORAL
  Filled 2023-04-30 (×3): qty 1

## 2023-04-30 NOTE — Progress Notes (Signed)
[facility]  Date of Admission:  04/27/2023   Total days of inpatient antibiotics 3  Active Problems:   Type 2 diabetes mellitus with peripheral neuropathy (HCC)   Osteomyelitis of great toe of right foot (HCC)   Type 2 diabetes mellitus (HCC)   Wound infection   Osteomyelitis [facility])          Assessment: 57 year old male admitted with: #Diabetic right foot osteomyelitis #Status post TMA and TAL on 2//23 with negative cultures - Patient is followed by infectious disease and podiatry.  MRI as below showed recurrent osteomyelitis possible small abscess.  Transition to Doxy and Augmentin.  Last dose was day prior to admission. - MRI of the right foot showed cellulitis around first metatarsal/plantar aspect, small abscess at the first tarsometatarsal joint, recurrent osteomyelitis in the remaining first metatarsal and medial cuneiform. - On arrival vital stable, on exam no signs of surrounding cellulitis. -Seen by podiatry. Taken to OR on 6/26 for Right Tendoachilles lengthening, Chopart amputation. Communicated with Dr.evans and he noted source achieved.   Recommendations: -D/C vanc and unasyn -Start doxy and augmenting x 1 week form OR for any residual cellulitis  -F/U with ID on 7/10(Greg Calone)  #Left lower extremity rash -Nystatin powder   #DM -A1C 5.6. Glycemic control for optimal wound healing. -management per primary  ID will sign off  Microbiology:   Antibiotics: Unasyn 6/29- Ctx 6/28 Vancomycin 6/28 Cultures: Blood 6/28 NG    SUBJECTIVE: Sitting at edge of chair Interval: febrile overnight, wbc 6.5 k  Review of Systems: Review of Systems  All other systems reviewed and are negative.    Scheduled Meds:  ALPRAZolam  1 mg Oral QID   amitriptyline  100 mg Oral QHS   amLODipine  10 mg Oral Daily   amoxicillin-clavulanate  1 tablet Oral Q12H   doxycycline  100 mg Oral Q12H   enoxaparin (LOVENOX) injection  40 mg  Subcutaneous Q24H   folic acid  1 mg Oral Daily   insulin aspart  0-15 Units Subcutaneous TID WC   morphine  30 mg Oral Q12H   rosuvastatin  20 mg Oral Daily   thiamine  100 mg Oral Daily   Continuous Infusions:  sodium chloride Stopped (04/30/23 1209)   PRN Meds:.sodium chloride, acetaminophen, albuterol, bisacodyl, HYDROmorphone (DILAUDID) injection, naLOXone (NARCAN)  injection, oxycodone, senna-docusate No Known Allergies  OBJECTIVE: Vitals:   04/29/23 1433 04/29/23 2015 04/30/23 0430 04/30/23 0736  BP: 115/86 112/70 122/72 126/70  Pulse: 91 80 79 81  Resp: 18 18 16 16   Temp: 98.4 F (36.9 C) 98.3 F (36.8 C) 98.3 F (36.8 C) 98.7 F (37.1 C)  TempSrc: Oral Oral Oral Oral  SpO2: 94% 96% 92% 92%  Weight:      Height:       Body mass index is 30.82 kg/m.  Physical Exam Constitutional:      General: He is not in acute distress.    Appearance: He is normal weight. He is not toxic-appearing.  HENT:     Head: Normocephalic and atraumatic.     Right Ear: External ear normal.     Left Ear: External ear normal.     Nose: No congestion or rhinorrhea.     Mouth/Throat:     Mouth: Mucous membranes are moist.     Pharynx: Oropharynx is clear.  Eyes:     Extraocular Movements: Extraocular movements intact.     Conjunctiva/sclera: Conjunctivae normal.  Pupils: Pupils are equal, round, and reactive to light.  Cardiovascular:     Rate and Rhythm: Normal rate and regular rhythm.     Heart sounds: No murmur heard.    No friction rub. No gallop.  Pulmonary:     Effort: Pulmonary effort is normal.     Breath sounds: Normal breath sounds.  Abdominal:     General: Abdomen is flat. Bowel sounds are normal.     Palpations: Abdomen is soft.  Musculoskeletal:        General: No swelling.     Cervical back: Normal range of motion and neck supple.     Comments: Right foot bandaged  Skin:    General: Skin is warm and dry.  Neurological:     General: No focal deficit present.      Mental Status: He is oriented to person, place, and time.  Psychiatric:        Mood and Affect: Mood normal.       Lab Results Lab Results  Component Value Date   WBC 6.5 04/30/2023   HGB 14.9 04/30/2023   HCT 45.8 04/30/2023   MCV 97.9 04/30/2023   PLT 179 04/30/2023    Lab Results  Component Value Date   CREATININE 0.98 04/29/2023   BUN 16 04/29/2023   NA 136 04/29/2023   K 4.4 04/29/2023   CL 104 04/29/2023   CO2 25 04/29/2023    Lab Results  Component Value Date   ALT 10 04/27/2023   AST 14 (L) 04/27/2023   ALKPHOS 91 04/27/2023   BILITOT 0.6 04/27/2023        Danelle Earthly, MD [facility] 04/30/2023, 3:52 PM   I have personally spent 55 minutes involved in face-to-face and non-face-to-face activities for this patient on the day of the visit. Professional time spent includes the following activities: Preparing to see the patient (review of tests), Obtaining and/or reviewing separately obtained history (admission/discharge record), Performing a medically appropriate examination and/or evaluation , Ordering medications/tests/procedures, referring and communicating with other health care professionals, Documenting clinical information in the EMR, Independently interpreting results (not separately reported), Communicating results to the patient/family/caregiver, Counseling and educating the patient/family/caregiver and Care coordination (not separately reported).

## 2023-04-30 NOTE — Progress Notes (Signed)
Patient was very drowsy and sleepy last night when night shift started but arousable.  Patient would be talking to you and just fall back to sleep.  Patient did not receive any further medications to add to his drowsiness and he did not complain of any pain or discomfort.    He is more alert and conversational this morning.

## 2023-04-30 NOTE — Progress Notes (Addendum)
PROGRESS NOTE   [name]  NWG:[id number] DOB: 1965-11-10 DOA: 04/27/2023 PCP: Elfredia Nevins, MD   Date of Service: the patient was seen and examined on 04/30/2023  Brief Narrative:   Eugene Diaz is a 57 y.o. male with medical history significant of chronic right foot diabetic ulcer infection, IIDM, HTN, frequent PVCs, HLD, diabetic neuropathy, anxiety/depression, left BKA, sent from infection disease office for evaluation of worsening of right foot infection.   Assessment and Plan:  Right foot diabetic ulcer infection and abscess Recurrent osteomyelitis Failed antibiotic management as an outpatient.  MRI foot suggestive of first metatarsal cellulitis and small abscess along plantar aspect of tarsometatarsal joint, recurrent osteomyelitis in the first metatarsal.  Postop day 2 partial amputation of right foot -Podiatry signed off-anticipate surgical cure of the osteomyelitis, d/c 1 week oral abx, leave clean dry and intact until follow-up in the office one week from Monday, 05/08/2023.  -ID recommending PO Augmentin and doxycycline for 1 week  -Pain control: Tylenol, home p.o. morphine and oxycodone, Dilaudid -Lovenox for DVT prophylaxis  -PT OT recommending home health services -Likely d/c 7/1  Diabetes mellitus CBGs 152, stable -Continue SSI for now -CBGs 4 times daily with meals and at bedtime -Hold home diabetic medications-metformin   HTN BP 126/70 -Continue amlodipine -Hold and lisinopril   Hx of frequent PVCs -Continue Metoprolol   DM neuropathy -Continue amitriptyline   Anxiety/depression -Continue amitriptyline and Xanax  Hyperlipidemia -Continue Crestor  Subjective:  Doing well. Feels ok to go home tomorrow.   Physical Exam:  Vitals:   04/29/23 1433 04/29/23 2015 04/30/23 0430 04/30/23 0736  BP: 115/86 112/70 122/72 126/70  Pulse: 91 80 79 81  Resp: 18 18 16 16   Temp: 98.4 F (36.9 C) 98.3 F (36.8 C) 98.3 F (36.8 C) 98.7 F (37.1 C)   TempSrc: Oral Oral Oral Oral  SpO2: 94% 96% 92% 92%  Weight:      Height:        General: Alert, no acute distress, pleasant Cardio: well perfused Pulm: normal work of breathing Extremities: left BKA, right foot in dressing, clean and dry  Neuro: Cranial nerves grossly intact  Data Reviewed:  I have personally reviewed and interpreted labs, imaging.   CBC: Recent Labs  Lab 04/24/23 0237 04/27/23 1139 04/29/23 0306 04/30/23 0427  WBC 6.9 8.6 7.6 6.5  NEUTROABS 3,305 4.4  --   --   HGB 17.0 17.6* 15.1 14.9  HCT 49.8 52.0 46.3 45.8  MCV 95.2 97.7 98.1 97.9  PLT 230 237 183 179    Basic Metabolic Panel: Recent Labs  Lab 04/24/23 0237 04/27/23 1139 04/29/23 0306  NA 138 133* 136  K 4.3 4.4 4.4  CL 105 101 104  CO2 22 23 25   GLUCOSE 102* 121* 133*  BUN 11 13 16   CREATININE 0.82 1.10 0.98  CALCIUM 9.3 9.2 9.1    GFR: Estimated Creatinine Clearance: 108 mL/min (by C-G formula based on SCr of 0.98 mg/dL). Liver Function Tests: Recent Labs  Lab 04/24/23 0237 04/27/23 1139  AST 17 14*  ALT 10 10  ALKPHOS  --  91  BILITOT 0.4 0.6  PROT 7.6 7.7  ALBUMIN  --  3.7     Coagulation Profile: No results for input(s): "INR", "PROTIME" in the last 168 hours.   Code Status:  Full code.  Code status decision has been confirmed with: patient  Family Communication:     Severity of Illness:  The appropriate patient status for  this patient is INPATIENT. Inpatient status is judged to be reasonable and necessary in order to provide the required intensity of service to ensure the patient's safety. The patient's presenting symptoms, physical exam findings, and initial radiographic and laboratory data in the context of their chronic comorbidities is felt to place them at high risk for further clinical deterioration. Furthermore, it is not anticipated that the patient will be medically stable for discharge from the hospital within 2 midnights of admission.   * I certify  that at the point of admission it is my clinical judgment that the patient will require inpatient hospital care spanning beyond 2 midnights from the point of admission due to high intensity of service, high risk for further deterioration and high frequency of surveillance required.*   Author:  Rolm Gala MD  04/30/2023 9:43 AM

## 2023-05-01 ENCOUNTER — Other Ambulatory Visit (HOSPITAL_COMMUNITY): Payer: Self-pay

## 2023-05-01 DIAGNOSIS — E119 Type 2 diabetes mellitus without complications: Secondary | ICD-10-CM | POA: Diagnosis not present

## 2023-05-01 DIAGNOSIS — M869 Osteomyelitis, unspecified: Secondary | ICD-10-CM | POA: Diagnosis not present

## 2023-05-01 LAB — GLUCOSE, CAPILLARY: Glucose-Capillary: 115 mg/dL — ABNORMAL HIGH (ref 70–99)

## 2023-05-01 LAB — BASIC METABOLIC PANEL
Anion gap: 7 (ref 5–15)
BUN: 13 mg/dL (ref 6–20)
CO2: 26 mmol/L (ref 22–32)
Calcium: 8.8 mg/dL — ABNORMAL LOW (ref 8.9–10.3)
Chloride: 99 mmol/L (ref 98–111)
Creatinine, Ser: 0.93 mg/dL (ref 0.61–1.24)
GFR, Estimated: >60 mL/min (ref >60)
Glucose, Bld: 118 mg/dL — ABNORMAL HIGH (ref 70–99)
Potassium: 3.9 mmol/L (ref 3.5–5.1)
Sodium: 132 mmol/L — ABNORMAL LOW (ref 135–145)

## 2023-05-01 LAB — CBC
HCT: 42.8 % (ref 39.0–52.0)
Hemoglobin: 14.2 g/dL (ref 13.0–17.0)
MCH: 32.6 pg (ref 26.0–34.0)
MCHC: 33.2 g/dL (ref 30.0–36.0)
MCV: 98.2 fL (ref 80.0–100.0)
Platelets: 176 10*3/uL (ref 150–400)
RBC: 4.36 MIL/uL (ref 4.22–5.81)
RDW: 14.6 % (ref 11.5–15.5)
WBC: 5.9 10*3/uL (ref 4.0–10.5)
nRBC: 0 % (ref 0.0–0.2)

## 2023-05-01 LAB — CULTURE, BLOOD (ROUTINE X 2): Special Requests: ADEQUATE

## 2023-05-01 LAB — AEROBIC/ANAEROBIC CULTURE W GRAM STAIN (SURGICAL/DEEP WOUND): Gram Stain: NONE SEEN

## 2023-05-01 MED ORDER — SENNOSIDES-DOCUSATE SODIUM 8.6-50 MG PO TABS: 1.0000 | ORAL_TABLET | Freq: Every evening | ORAL | Status: DC | PRN

## 2023-05-01 MED ORDER — AMOXICILLIN-POT CLAVULANATE 875-125 MG PO TABS
1.0000 | ORAL_TABLET | Freq: Two times a day (BID) | ORAL | 0 refills | Status: DC
  Filled 2023-05-01 (×2): qty 14, 7d supply, fill #0

## 2023-05-01 MED ORDER — DOXYCYCLINE HYCLATE 100 MG PO TABS
100.0000 mg | ORAL_TABLET | Freq: Two times a day (BID) | ORAL | 0 refills | Status: DC
  Filled 2023-05-01 (×2): qty 14, 7d supply, fill #0

## 2023-05-01 NOTE — Progress Notes (Signed)
Physical Therapy Treatment Patient Details Name: Eugene Diaz MRN: [id number] DOB: 11/08/1965 Today's Date: 05/01/2023   History of Present Illness Pt is a 57 y.o. M who presents 04/27/2023 with worsening infection of right foot, now s/p partial foot amputation and tendo achilles tendon lengthening 6/28. Significant PMH: left BKA, diabetic neuropathy, PVD, depression, type 2 diabetes mellitus.    PT Comments    Pt eager to get home. Able to don prosthetic and walking boot independently. Education on weightbearing restrictions on R LE, pt able to make pivot to wheelchair with min guard, and propel wheelchair independently. Pt reports he would like a motorized wheelchair, advised to talk to his PCP. Encouraged to lean on his family for some support, despite his pride to be able to successfully heal R foot and minimize need for further amputation. D/c plans remain appropriate. Pt to discharge today.    Recommendations for follow up therapy are one component of a multi-disciplinary discharge planning process, led by the attending physician.  Recommendations may be updated based on patient status, additional functional criteria and insurance authorization.     Assistance Recommended at Discharge PRN  Patient can return home with the following A little help with walking and/or transfers;A little help with bathing/dressing/bathroom;Assistance with cooking/housework;Assist for transportation;Help with stairs or ramp for entrance   Equipment Recommendations  None recommended by PT (pt equipped)       Precautions / Restrictions Precautions Precautions: Fall;Other (comment) Precaution Comments: prior L BKA Restrictions Weight Bearing Restrictions: Yes RLE Weight Bearing: Partial weight bearing RLE Partial Weight Bearing Percentage or Pounds: 50 LLE Weight Bearing: Weight bearing as tolerated Other Position/Activity Restrictions: with prosthetic donned     Mobility  Bed Mobility Overal bed  mobility: Needs Assistance Bed Mobility: Supine to Sit     Supine to sit: Modified independent (Device/Increase time)     General bed mobility comments: use of bed rail to pull to EoB    Transfers Overall transfer level: Needs assistance Equipment used: None         Squat pivot transfers: Min guard     General transfer comment: on initial attempt tries to come up to standing with >50% weight through R LE, educated on squat pivot transfer with decrease weight throudh RLE using heel of R foot as a balance pivot point only, pt min guard for safety    Ambulation/Gait               General Gait Details: deferred           Corporate treasurer Wheelchair mobility: Yes Wheelchair propulsion: Left lower extremity, Both upper extremities Wheelchair parts: Independent Distance: 61 Wheelchair Assistance Details (indicate cue type and reason): pt indepedent with parts and propulsion     Balance Overall balance assessment: Needs assistance Sitting-balance support: Feet supported Sitting balance-Leahy Scale: Good         Standing balance comment: did not come to standing                            Cognition Arousal/Alertness: Awake/alert Behavior During Therapy: WFL for tasks assessed/performed Overall Cognitive Status: Impaired/Different from baseline Area of Impairment: Following commands, Awareness, Problem solving                       Following Commands: Follows multi-step commands inconsistently   Awareness: Emergent Problem Solving: Slow processing, Difficulty sequencing, Requires verbal cues, Requires tactile cues  General Comments: sleepy, closing eyes occasionally when PT providing education, encouraged pt to ask for help so that he can prioritize R LE healing           General Comments General comments (skin integrity, edema, etc.): Pt donned walking boot on R in bed and able to don prosthetic on L seated  EoB, no assistance needed. VSS on RA, discussed car transfers      Pertinent Vitals/Pain Pain Assessment Pain Assessment: Faces Faces Pain Scale: Hurts little more Pain Location: incisional Pain Descriptors / Indicators: Operative site guarding Pain Intervention(s): Limited activity within patient's tolerance, Monitored during session, Repositioned, RN gave pain meds during session     PT Goals (current goals can now be found in the care plan section) Acute Rehab PT Goals Patient Stated Goal: agreeable to out of bed mobility PT Goal Formulation: With patient Time For Goal Achievement: 05/13/23 Potential to Achieve Goals: Good Progress towards PT goals: Progressing toward goals    Frequency    Min 3X/week      PT Plan Current plan remains appropriate       AM-PAC PT "6 Clicks" Mobility   Outcome Measure  Help needed turning from your back to your side while in a flat bed without using bedrails?: None Help needed moving from lying on your back to sitting on the side of a flat bed without using bedrails?: A Little Help needed moving to and from a bed to a chair (including a wheelchair)?: A Little Help needed standing up from a chair using your arms (e.g., wheelchair or bedside chair)?: A Little Help needed to walk in hospital room?: A Little Help needed climbing 3-5 steps with a railing? : Total 6 Click Score: 17    End of Session Equipment Utilized During Treatment: Gait belt Activity Tolerance: Patient tolerated treatment well Patient left: with call bell/phone within reach (in wheelchair with OT in gym) Nurse Communication: Mobility status PT Visit Diagnosis: Unsteadiness on feet (R26.81);Other abnormalities of gait and mobility (R26.89);Pain Pain - Right/Left: Right Pain - part of body: Ankle and joints of foot     Time: 1610-9604 PT Time Calculation (min) (ACUTE ONLY): 29 min  Charges:  $Therapeutic Activity: 8-22 mins $Wheel Chair Management: 8-22  mins                     Samhitha Rosen B. Beverely Risen PT, DPT Acute Rehabilitation Services Please use secure chat or  Call Office [phone]    [name] [facility] 05/01/2023, 10:23 AM

## 2023-05-01 NOTE — Plan of Care (Signed)
  Problem: Coping: Goal: Ability to adjust to condition or change in health will improve Outcome: Progressing   Problem: Skin Integrity: Goal: Risk for impaired skin integrity will decrease Outcome: Progressing   Problem: Education: Goal: Knowledge of General Education information will improve Description: Including pain rating scale, medication(s)/side effects and non-pharmacologic comfort measures Outcome: Progressing   Problem: Activity: Goal: Risk for activity intolerance will decrease Outcome: Progressing   Problem: Nutrition: Goal: Adequate nutrition will be maintained Outcome: Progressing   Problem: Coping: Goal: Level of anxiety will decrease Outcome: Progressing   Problem: Pain Managment: Goal: General experience of comfort will improve Outcome: Progressing   Problem: Safety: Goal: Ability to remain free from injury will improve Outcome: Progressing   Problem: Education: Goal: Knowledge of General Education information will improve Description: Including pain rating scale, medication(s)/side effects and non-pharmacologic comfort measures Outcome: Progressing

## 2023-05-01 NOTE — Plan of Care (Signed)

## 2023-05-01 NOTE — Care Management Important Message (Signed)
Important Message  Patient Details  Name: YONATHAN DEMKE MRN: [id number] Date of Birth: 04-12-1966   Medicare Important Message Given:  No     Sherilyn Banker 05/01/2023, 1:46 PM

## 2023-05-01 NOTE — Discharge Summary (Signed)
Physician Discharge Summary  BEUFORD Diaz UEA:[id number] DOB: 09-21-1966 DOA: 04/27/2023  PCP: Elfredia Nevins, MD  Admit date: 04/27/2023 Discharge date: 05/01/2023  Admitted From: home Discharge disposition: home   Recommendations for Outpatient Follow-Up:   Finish course of abx Home health Podiatry follow up 1 week  Discharge Diagnosis:   Active Problems:   Type 2 diabetes mellitus with peripheral neuropathy (HCC)   Osteomyelitis of great toe of right foot (HCC)   Type 2 diabetes mellitus (HCC)   Wound infection   Osteomyelitis (HCC)    Discharge Condition: Improved.  Diet recommendation: Carbohydrate-modified.   Wound care: None.  Code status: Full.   History of Present Illness:   Eugene Diaz is a 57 y.o. male with medical history significant of chronic right foot diabetic ulcer infection, IIDM, HTN, frequent PVCs, HLD, diabetic neuropathy, anxiety/depression, left BKA, sent from infection disease office for evaluation of worsening of right foot infection.   Patient had right transmetatarsal amputation with a chronic open ulcer on the medial side of the stump and was found to be infected recently.  Has been following with infectious disease and podiatry for the chronic infection, and was started on p.o. antibiotics doxycycline and Augmentin last week and underwent MRI 3 days ago.  Yesterday, patient was called about MRI results showed evidence of recurrent osteomyelitis and instructed to come to ED. patient reported severe pain associated with the right foot stump and ulcer for which he takes alternate oxycodone and p.o. morphine.  He denies any fever or chills at home.   ED Course: Afebrile with no tachycardia nonhypotensive.  WBC 8.6, MRI reviewed that showed first metatarsal surrounding soft tissue cellulitis and small abscess along the plantar aspect of the first tarsometatarsal joint and recurrent osteomyelitis involving the remaining first metatarsal  and medial cuneiform.   Hospital Course by Problem:   Right foot diabetic ulcer infection and abscess Recurrent osteomyelitis Failed antibiotic management as an outpatient.  MRI foot suggestive of first metatarsal cellulitis and small abscess along plantar aspect of tarsometatarsal joint, recurrent osteomyelitis in the first metatarsal.  Postop day 2 partial amputation of right foot -Podiatry signed off-anticipate surgical cure of the osteomyelitis, d/c 1 week oral abx, leave clean dry and intact until follow-up in the office one week from Monday, 05/08/2023.  -ID recommending PO Augmentin and doxycycline for 1 week  -Pain control: Tylenol, home p.o. morphine and oxycodone, Dilaudid -PT OT recommending home health services- orders placed -Likely d/c 7/1   Diabetes mellitus CBGs 152, stable -Continue SSI for now -CBGs 4 times daily with meals and at bedtime -Hold home diabetic medications-metformin   HTN BP 126/70 -Continue amlodipine -Hold and lisinopril    Hx of frequent PVCs -Continue Metoprolol   DM neuropathy -Continue amitriptyline   Anxiety/depression -Continue amitriptyline and Xanax   Hyperlipidemia -Continue Crestor    Medical Consultants:   ID podiatry   Discharge Exam:   Vitals:   05/01/23 0426 05/01/23 0808  BP: 117/71 113/69  Pulse: 66 70  Resp: 17 18  Temp: 98.4 F (36.9 C) 98.7 F (37.1 C)  SpO2: 92% 94%   Vitals:   04/30/23 1635 04/30/23 2016 05/01/23 0426 05/01/23 0808  BP: 119/66 127/69 117/71 113/69  Pulse: 72 76 66 70  Resp: 18 17 17 18   Temp: 99 F (37.2 C) 98.5 F (36.9 C) 98.4 F (36.9 C) 98.7 F (37.1 C)  TempSrc: Oral Oral  Oral  SpO2: 91% 92% 92% 94%  Weight:      Height:        General exam: Appears calm and comfortable.    The results of significant diagnostics from this hospitalization (including imaging, microbiology, ancillary and laboratory) are listed below for reference.     Procedures and Diagnostic  Studies:   DG Foot Complete Right  Result Date: 04/28/2023 CLINICAL DATA:  Postoperative state EXAM: RIGHT FOOT COMPLETE - 3+ VIEW COMPARISON:  10/12/2022 FINDINGS: There is interval removal of metatarsal stumps, distal row of tarsals and navicular. No focal lytic lesions are seen in visualized bony structures. There are pockets of air in the soft tissues from recent surgery. Skin staples are noted along the anterior aspect of the stump. IMPRESSION: There is interval removal of metatarsal stumps, navicular and distal row tarsals. There are pockets of air in the soft tissues from recent surgery. No focal lytic lesions are seen in remaining bony structures. Plantar spur is seen in calcaneus. Electronically Signed   By: Ernie Avena M.D.   On: 04/28/2023 19:04     Labs:   Basic Metabolic Panel: Recent Labs  Lab 04/27/23 1139 04/29/23 0306 05/01/23 0500  NA 133* 136 132*  K 4.4 4.4 3.9  CL 101 104 99  CO2 23 25 26   GLUCOSE 121* 133* 118*  BUN 13 16 13   CREATININE 1.10 0.98 0.93  CALCIUM 9.2 9.1 8.8*   GFR Estimated Creatinine Clearance: 113.8 mL/min (by C-G formula based on SCr of 0.93 mg/dL). Liver Function Tests: Recent Labs  Lab 04/27/23 1139  AST 14*  ALT 10  ALKPHOS 91  BILITOT 0.6  PROT 7.7  ALBUMIN 3.7   No results for input(s): "LIPASE", "AMYLASE" in the last 168 hours. No results for input(s): "AMMONIA" in the last 168 hours. Coagulation profile No results for input(s): "INR", "PROTIME" in the last 168 hours.  CBC: Recent Labs  Lab 04/27/23 1139 04/29/23 0306 04/30/23 0427 05/01/23 0500  WBC 8.6 7.6 6.5 5.9  NEUTROABS 4.4  --   --   --   HGB 17.6* 15.1 14.9 14.2  HCT 52.0 46.3 45.8 42.8  MCV 97.7 98.1 97.9 98.2  PLT 237 183 179 176   Cardiac Enzymes: No results for input(s): "CKTOTAL", "CKMB", "CKMBINDEX", "TROPONINI" in the last 168 hours. BNP: Invalid input(s): "POCBNP" CBG: Recent Labs  Lab 04/30/23 0734 04/30/23 1105 04/30/23 1617  04/30/23 2017 05/01/23 0705  GLUCAP 152* 151* 146* 161* 115*   D-Dimer No results for input(s): "DDIMER" in the last 72 hours. Hgb A1c No results for input(s): "HGBA1C" in the last 72 hours. Lipid Profile No results for input(s): "CHOL", "HDL", "LDLCALC", "TRIG", "CHOLHDL", "LDLDIRECT" in the last 72 hours. Thyroid function studies No results for input(s): "TSH", "T4TOTAL", "T3FREE", "THYROIDAB" in the last 72 hours.  Invalid input(s): "FREET3" Anemia work up No results for input(s): "VITAMINB12", "FOLATE", "FERRITIN", "TIBC", "IRON", "RETICCTPCT" in the last 72 hours. Microbiology Recent Results (from the past 240 hour(s))  Surgical pcr screen     Status: Abnormal   Collection Time: 04/28/23  5:40 AM   Specimen: Nasal Mucosa; Nasal Swab  Result Value Ref Range Status   MRSA, PCR POSITIVE (A) NEGATIVE Final   Staphylococcus aureus POSITIVE (A) NEGATIVE Final    Comment: (NOTE) The Xpert SA Assay (FDA approved for NASAL specimens in patients 35 years of age and older), is one component of a comprehensive surveillance program. It is not intended to diagnose infection nor to guide or monitor treatment. Performed at [facility], [address], [place], [place] [address]   Culture, blood (Routine X 2) w Reflex to ID Panel     Status: None (Preliminary result)   Collection Time: 04/28/23 10:43 AM   Specimen: BLOOD RIGHT FOREARM  Result Value Ref Range Status   Specimen Description BLOOD RIGHT FOREARM  Final   Special Requests   Final    BOTTLES DRAWN AEROBIC AND ANAEROBIC Blood Culture adequate volume   Culture   Final    NO GROWTH 2 DAYS Performed at [facility], [address] [place], [place] [address]    Report Status PENDING  Incomplete  Culture, blood (Routine X 2) w Reflex to ID Panel     Status: None (Preliminary result)   Collection Time: 04/28/23 10:43 AM   Specimen: BLOOD  Result Value Ref Range Status   Specimen Description BLOOD LEFT ANTECUBITAL   Final   Special Requests   Final    BOTTLES DRAWN AEROBIC AND ANAEROBIC Blood Culture adequate volume   Culture   Final    NO GROWTH 2 DAYS Performed at [facility], [address], [place], [place] [address]    Report Status PENDING  Incomplete  Aerobic/Anaerobic Culture w Gram Stain (surgical/deep wound)     Status: None (Preliminary result)   Collection Time: 04/28/23  3:49 PM   Specimen: PATH Bone biopsy; Tissue  Result Value Ref Range Status   Specimen Description BONE  Final   Special Requests RIGHT MEDIAL CHOPART  Final   Gram Stain NO WBC SEEN NO ORGANISMS SEEN   Final   Culture   Final    NO GROWTH 2 DAYS NO ANAEROBES ISOLATED; CULTURE IN PROGRESS FOR 5 DAYS Performed at [facility], [address], [place], [place] [address]    Report Status PENDING  Incomplete     Discharge Instructions:   Discharge Instructions     Diet Carb Modified   Complete by: As directed    Increase activity slowly   Complete by: As directed       Allergies as of 05/01/2023   No Known Allergies      Medication List     STOP taking these medications    lisinopril 10 MG tablet Commonly known as: ZESTRIL       TAKE these medications    acetaminophen 325 MG tablet Commonly known as: TYLENOL Take 2 tablets (650 mg total) by mouth every 6 (six) hours as needed for mild pain.   albuterol 108 (90 Base) MCG/ACT inhaler Commonly known as: VENTOLIN HFA Inhale 1-2 puffs into the lungs every 4 (four) hours as needed for wheezing or shortness of breath.   ALPRAZolam 1 MG tablet Commonly known as: XANAX Take 1 mg by mouth 4 (four) times daily.   amitriptyline 100 MG tablet Commonly known as: ELAVIL Take 100 mg by mouth at bedtime.   amLODipine 10 MG tablet Commonly known as: NORVASC Take 1 tablet (10 mg total) by mouth daily.   amoxicillin-clavulanate 875-125 MG tablet Commonly known as: AUGMENTIN Take 1 tablet by mouth every 12 (twelve) hours. What changed:  when to take this   diclofenac Sodium 1 % Gel Commonly known as: VOLTAREN Apply 4 g topically 4 (four) times daily as needed (Apply to painful area over left upper front chest.).   doxycycline 100 MG tablet Commonly known as: VIBRA-TABS Take 1 tablet (100 mg total) by mouth every 12 (twelve) hours. What changed: when to take  this   folic acid 1 MG tablet Commonly known as: FOLVITE Take 1 tablet (1 mg total) by mouth daily.   metFORMIN 500 MG tablet Commonly known as: GLUCOPHAGE Take 500 mg by mouth 2 (two) times daily.   morphine 30 MG 12 hr tablet Commonly known as: MS CONTIN Take 30 mg by mouth every 12 (twelve) hours.   multivitamin with minerals Tabs tablet Take 1 tablet by mouth daily.   oxycodone 30 MG immediate release tablet Commonly known as: ROXICODONE Take 30 mg by mouth 3 (three) times daily.   rosuvastatin 20 MG tablet Commonly known as: CRESTOR Take 1 tablet (20 mg total) by mouth daily.   senna-docusate 8.6-50 MG tablet Commonly known as: Senokot-S Take 1 tablet by mouth at bedtime as needed for mild constipation.   thiamine 100 MG tablet Commonly known as: VITAMIN B1 Take 1 tablet (100 mg total) by mouth daily.          Time coordinating discharge: 45 min  Signed:  Joseph Art DO  [facility] 05/01/2023, 8:13 AM

## 2023-05-01 NOTE — Progress Notes (Signed)
7/1 Patient discharged home before IMM Letter was given. The IMM Letter will be mailed to the address on file.

## 2023-05-01 NOTE — Progress Notes (Signed)
Discharge instructions given. Patient verbalized understanding and all questions were answered.  ?

## 2023-05-01 NOTE — Anesthesia Postprocedure Evaluation (Signed)
Anesthesia Post Note  Patient: Eugene Diaz  Procedure(s) Performed: RIGHT PARTIAL FOOT AMPUTATION (Right: Foot) BONE BIOPSY (Right: Foot)     Patient location during evaluation: PACU Anesthesia Type: MAC Level of consciousness: sedated and patient cooperative Pain management: pain level controlled Vital Signs Assessment: post-procedure vital signs reviewed and stable Respiratory status: spontaneous breathing Cardiovascular status: stable Anesthetic complications: no   No notable events documented.  Last Vitals:  Vitals:   04/30/23 2016 05/01/23 0426  BP: 127/69 117/71  Pulse: 76 66  Resp: 17 17  Temp: 36.9 C 36.9 C  SpO2: 92% 92%    Last Pain:  Vitals:   05/01/23 0426  TempSrc:   PainSc: 0-No pain                 Lewie Loron

## 2023-05-01 NOTE — Progress Notes (Signed)
Occupational Therapy Treatment Patient Details Name: Eugene Diaz MRN: [id number] DOB: 13-Aug-1966 Today's Date: 05/01/2023   History of present illness Pt is a 57 y.o. M who presents 04/27/2023 with worsening infection of right foot, now s/p partial foot amputation and tendo achilles tendon lengthening 6/28. Significant PMH: left BKA, diabetic neuropathy, PVD, depression, type 2 diabetes mellitus.   OT comments  Pt continuing to progress in OT sessions, Pt demonstrating good understanding of WB precautions from PT session. Pt completing tub transfer with Min guard assist with cues for hand placement and use of wall support to maintain balance during low sit transfer and posterior transfer onto tub seat, pt verbalized he is comfortable performing transfer this way. Reinforced hand placement during STS using crutches to reduce risk of fall, pt verbalized understanding. OT to continue to progress pt as able. DC plans remain appropriate for [facility]   Recommendations for follow up therapy are one component of a multi-disciplinary discharge planning process, led by the attending physician.  Recommendations may be updated based on patient status, additional functional criteria and insurance authorization.    Assistance Recommended at Discharge Set up Supervision/Assistance  Patient can return home with the following  A little help with walking and/or transfers;Assistance with cooking/housework;Assist for transportation   Equipment Recommendations  Tub/shower bench    Recommendations for Other Services      Precautions / Restrictions Precautions Precautions: Fall;Other (comment) Precaution Comments: prior L BKA Restrictions Weight Bearing Restrictions: Yes RLE Weight Bearing: Partial weight bearing RLE Partial Weight Bearing Percentage or Pounds: 50 LLE Weight Bearing: Weight bearing as tolerated Other Position/Activity Restrictions: with prosthetic donned       Mobility Bed Mobility                General bed mobility comments: pt rec'd from PT in rehab gym and left sitting EOB with tray in front    Transfers Overall transfer level: Needs assistance   Transfers: Bed to chair/wheelchair/BSC, Sit to/from Stand Sit to Stand: Min guard Stand pivot transfers: Min guard         General transfer comment: OT cueing patient to lock w/c brakes and reposition to allow better support while reaching for bedrail during transfer. STS from edge of tub & w/c min guard     Balance Overall balance assessment: Needs assistance Sitting-balance support: Feet supported Sitting balance-Leahy Scale: Good     Standing balance support: Bilateral upper extremity supported Standing balance-Leahy Scale: Poor Standing balance comment: Reliant on ext suppport                           ADL either performed or assessed with clinical judgement   ADL                                   Tub/ Shower Transfer: Insurance risk surveyor Details (indicate cue type and reason): + crutches, modified strategy to complete tub trasnfer. Educated pt on the use of wall support to maintain balance, Pt with preference of sitting on edge of tub then performing posterior transfer to tub seat        Extremity/Trunk Assessment              Vision       Perception     Praxis      Cognition Arousal/Alertness: Awake/alert Behavior During Therapy: [facility] for tasks assessed/performed Overall  Cognitive Status: Impaired/Different from baseline Area of Impairment: Problem solving                             Problem Solving: Slow processing          Exercises      Shoulder Instructions       General Comments Pt boot and LLE prosthetic donned  throughout session    Pertinent Vitals/ Pain       Pain Assessment Pain Assessment: Faces Faces Pain Scale: Hurts little more Pain Location: incisional Pain Descriptors / Indicators: Operative site  guarding Pain Intervention(s): Monitored during session, Repositioned, Premedicated before session  Home Living                                          Prior Functioning/Environment              Frequency  Min 2X/week        Progress Toward Goals  OT Goals(current goals can now be found in the care plan section)  Progress towards OT goals: Progressing toward goals  Acute Rehab OT Goals OT Goal Formulation: With patient Time For Goal Achievement: 05/13/23 Potential to Achieve Goals: Good  Plan Discharge plan remains appropriate;Frequency remains appropriate    Co-evaluation                 AM-PAC OT "6 Clicks" Daily Activity     Outcome Measure   Help from another person eating meals?: None Help from another person taking care of personal grooming?: A Little Help from another person toileting, which includes using toliet, bedpan, or urinal?: A Little Help from another person bathing (including washing, rinsing, drying)?: A Little Help from another person to put on and taking off regular upper body clothing?: None Help from another person to put on and taking off regular lower body clothing?: None 6 Click Score: 21    End of Session Equipment Utilized During Treatment: Gait belt;Other (comment) (crutches + w/c)  OT Visit Diagnosis: Unsteadiness on feet (R26.81);Other abnormalities of gait and mobility (R26.89)   Activity Tolerance Patient tolerated treatment well   Patient Left in bed;with call bell/phone within reach   Nurse Communication Mobility status        Time: 4098-1191 OT Time Calculation (min): 17 min  Charges: OT General Charges $OT Visit: 1 Visit OT Treatments $Therapeutic Activity: 8-22 mins  05/01/2023  AB, OTR/L  Acute Rehabilitation Services  Office: [phone]   Tristan Schroeder 05/01/2023, 10:25 AM

## 2023-05-01 NOTE — TOC Transition Note (Signed)
Transition of Care [facility]) - CM/SW Discharge Note   Patient Details  Name: Eugene Diaz MRN: [id number] Date of Birth: 03/28/1966  Transition of Care [facility]) CM/SW Contact:  Epifanio Lesches, RN Phone Number: [phone] 05/01/2023, 9:55 AM   Clinical Narrative:    Patient will DC to: home Anticipated DC date: 05/01/2023 Family notified: yes Transport by: car        - s/p partial foot amputation and tendo achilles tendon lengthening 6/28, hx: left BKA   Per MD patient ready for DC today after clearance from therapy. Pt from home alone. States has  family / friend support if needed. RN, patient, and patient's family aware of DC. Orders noted for home health services. Pt agreeable. Pt without provider preference. Referral made with [facility] and accepted , start of care within 48 hours post discharge. Pt without DME needs.  Post hospital f/u noted on AVS. Pt without RXmed concerns. Family to provide transportation to home.   RNCM will sign off for now as intervention is no longer needed. Please consult [place] again if new needs arise.      Barriers to Discharge: No Barriers Identified   Patient Goals and CMS Choice   Choice offered to / list presented to : Patient  Discharge Placement                         Discharge Plan and Services Additional resources added to the After Visit Summary for     Discharge Planning Services: CM Consult                      HH Arranged: PT, Social Work [facility] Agency: [facility] Date [facility] Agency Contacted: 05/01/23 Time HH Agency Contacted: [phone] Representative spoke with at [facility] Agency: Tresa Endo  Social Determinants of Health (SDOH) Interventions SDOH Screenings   Food Insecurity: No Food Insecurity (10/13/2022)  Housing: Low Risk  (10/13/2022)  Transportation Needs: No Transportation Needs (10/13/2022)  Utilities: Not At Risk (10/13/2022)  Depression (PHQ2-9): Low Risk  (04/24/2023)  Tobacco Use: High Risk  (04/30/2023)     Readmission Risk Interventions    05/23/2022   12:50 PM  Readmission Risk Prevention Plan  Transportation Screening Complete  HRI or Home Care Consult Complete  Social Work Consult for Recovery Care Planning/Counseling Complete  Palliative Care Screening Not Applicable  Medication Review Ocean[employer]) Complete

## 2023-05-02 LAB — SURGICAL PATHOLOGY

## 2023-05-02 LAB — CULTURE, BLOOD (ROUTINE X 2)

## 2023-05-03 ENCOUNTER — Telehealth: Payer: Self-pay | Admitting: Podiatry

## 2023-05-03 LAB — CULTURE, BLOOD (ROUTINE X 2): Special Requests: ADEQUATE

## 2023-05-03 NOTE — Telephone Encounter (Signed)
Received voicemail from pts emergency contact stating pt had surgery to have rest of foot amputated last week and the only appt pt is scheduled for is 8.6 and she thought he should be seen before that.  I returned call and pt is scheduled to see Dr Logan Bores 7.8 at 1115am.

## 2023-05-08 ENCOUNTER — Ambulatory Visit (INDEPENDENT_AMBULATORY_CARE_PROVIDER_SITE_OTHER): Payer: Medicare HMO | Admitting: Podiatry

## 2023-05-08 DIAGNOSIS — L97512 Non-pressure chronic ulcer of other part of right foot with fat layer exposed: Secondary | ICD-10-CM

## 2023-05-08 MED ORDER — AMOXICILLIN-POT CLAVULANATE 875-125 MG PO TABS: 1.0000 | ORAL_TABLET | Freq: Two times a day (BID) | ORAL | 0 refills | Status: DC

## 2023-05-10 ENCOUNTER — Ambulatory Visit: Payer: Medicare HMO | Admitting: Family

## 2023-05-10 DIAGNOSIS — E1159 Type 2 diabetes mellitus with other circulatory complications: Secondary | ICD-10-CM | POA: Diagnosis not present

## 2023-05-10 DIAGNOSIS — E1165 Type 2 diabetes mellitus with hyperglycemia: Secondary | ICD-10-CM | POA: Diagnosis not present

## 2023-05-10 DIAGNOSIS — E1152 Type 2 diabetes mellitus with diabetic peripheral angiopathy with gangrene: Secondary | ICD-10-CM | POA: Diagnosis not present

## 2023-05-10 DIAGNOSIS — G894 Chronic pain syndrome: Secondary | ICD-10-CM | POA: Diagnosis not present

## 2023-05-10 DIAGNOSIS — Z0001 Encounter for general adult medical examination with abnormal findings: Secondary | ICD-10-CM | POA: Diagnosis not present

## 2023-05-10 DIAGNOSIS — I10 Essential (primary) hypertension: Secondary | ICD-10-CM | POA: Diagnosis not present

## 2023-05-10 DIAGNOSIS — Z6827 Body mass index (BMI) 27.0-27.9, adult: Secondary | ICD-10-CM | POA: Diagnosis not present

## 2023-05-10 DIAGNOSIS — Z1331 Encounter for screening for depression: Secondary | ICD-10-CM | POA: Diagnosis not present

## 2023-05-10 DIAGNOSIS — M150 Primary generalized (osteo)arthritis: Secondary | ICD-10-CM | POA: Diagnosis not present

## 2023-05-10 NOTE — Progress Notes (Deleted)
Subjective:    Patient ID: Eugene Diaz, male    DOB: April 22, 1966, 57 y.o.   MRN: [id number]  No chief complaint on file.   HPI:  Eugene Diaz is a 57 y.o. male with diabetes complicated by right foot osteomyelitis s/p  TMA and TAL on 2/23 and recently admitted with MRI showing cellulitis and small abscess at the first tarsometatarsal joint s/p right foot partial amputation on 04/28/23 with source control achieved. Last seen by Dr. Thedore Mins on 04/30/27 on POD #2 with recommendations for 1 week of Augmen and Doxycycline for any residual cellulitis. Here today for follow up.    No Known Allergies    Outpatient Medications Prior to Visit  Medication Sig Dispense Refill   acetaminophen (TYLENOL) 325 MG tablet Take 2 tablets (650 mg total) by mouth every 6 (six) hours as needed for mild pain.     albuterol (VENTOLIN HFA) 108 (90 Base) MCG/ACT inhaler Inhale 1-2 puffs into the lungs every 4 (four) hours as needed for wheezing or shortness of breath.     ALPRAZolam (XANAX) 1 MG tablet Take 1 mg by mouth 4 (four) times daily.     amitriptyline (ELAVIL) 100 MG tablet Take 100 mg by mouth at bedtime.     amLODipine (NORVASC) 10 MG tablet Take 1 tablet (10 mg total) by mouth daily. 90 tablet 3   amoxicillin-clavulanate (AUGMENTIN) 875-125 MG tablet Take 1 tablet by mouth every 12 (twelve) hours. 28 tablet 0   diclofenac Sodium (VOLTAREN) 1 % GEL Apply 4 g topically 4 (four) times daily as needed (Apply to painful area over left upper front chest.). 50 g 0   doxycycline (VIBRA-TABS) 100 MG tablet Take 1 tablet (100 mg total) by mouth every 12 (twelve) hours. 14 tablet 0   folic acid (FOLVITE) 1 MG tablet Take 1 tablet (1 mg total) by mouth daily. 30 tablet 0   metFORMIN (GLUCOPHAGE) 500 MG tablet Take 500 mg by mouth 2 (two) times daily.     morphine (MS CONTIN) 30 MG 12 hr tablet Take 30 mg by mouth every 12 (twelve) hours.     Multiple Vitamin (MULTIVITAMIN WITH MINERALS) TABS tablet Take 1  tablet by mouth daily.     oxycodone (ROXICODONE) 30 MG immediate release tablet Take 30 mg by mouth 3 (three) times daily.     rosuvastatin (CRESTOR) 20 MG tablet Take 1 tablet (20 mg total) by mouth daily. 90 tablet 3   senna-docusate (SENOKOT-S) 8.6-50 MG tablet Take 1 tablet by mouth at bedtime as needed for mild constipation.     thiamine 100 MG tablet Take 1 tablet (100 mg total) by mouth daily. 30 tablet 0   No facility-administered medications prior to visit.     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) 05/23/2022   Depression    Diabetic neuropathy (HCC)    Edema of both lower extremities    Essential tremor    neurologist-- dr patel--- due to alcohol abuse   GERD (gastroesophageal reflux disease)    History of cellulitis 2020   left lower leg;   recurrent 03-25-2020   History of esophageal stricture    s/p  dilatation 02-13-2018  History of osteomyelitis 2020   left great toe     Hypercholesterolemia    Hypertension    followed by pcp  (nuclear stress test 03-11-2014 low risk w/ no ischemia, ef 65%   Normocytic anemia    followed by pcp   (03-18-2020 had transfusion's 02/ 2021)   Osteomyelitis of great toe of right foot (HCC) 08/18/2021   Peripheral vascular disease (HCC)    Severe depression (HCC) 06/09/2021   Status post incision and drainage followed by Dr Samuella Cota and pcp   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   (03-17-2020  pt states most of incision is healed with exception an area that is still draining,  daily dressing change),  foot is red but not warm to the touch and has swelling but has improved)   Suicidal ideation 06/09/2021   Syncope and collapse 06/07/2022   Syncope, near 06/20/2022   Thrombocytopenia (HCC)    chronic   Type 2 diabetes  mellitus (HCC)    followed by pcp---    (03-18-2020  pt stated does not check blood sugar)   Vaccine counseling 08/29/2022     Past Surgical History:  Procedure Laterality Date   AMPUTATION Left 10/16/2019   Procedure: Left partial second ray resection; placement of antibiotic beads;  Surgeon: Park Liter, DPM;  [facility]: [facility];  Service: Podiatry;  Laterality: Left;   AMPUTATION Left 11/13/2019   Procedure: Left Midfoot Amputation - Transmetatarsal vs. Lisfranc; Placement antibiotic beads;  Surgeon: Park Liter, DPM;  [facility];  Service: Podiatry;  Laterality: Left;   AMPUTATION Left 12/21/2019   Procedure: Chopart Amputation left foot;  Surgeon: Park Liter, DPM;  [facility];  Service: Podiatry;  Laterality: Left;   AMPUTATION Left 06/24/2020   Procedure: LEFT BELOW KNEE AMPUTATION;  Surgeon: Nadara Mustard, MD;  [facility];  Service: Orthopedics;  Laterality: Left;   AMPUTATION Right 03/29/2021   Procedure: AMPUTATION RAY;  Surgeon: Park Liter, DPM;  [facility];  Service: Podiatry;  Laterality: Right;   AMPUTATION Right 04/28/2023   Procedure: RIGHT PARTIAL FOOT AMPUTATION;  Surgeon: Felecia Shelling, DPM;  [facility];  Service: Podiatry;  Laterality: Right;   AMPUTATION TOE Left 08/14/2019   Procedure: AMPUTATION GREAT TOE;  Surgeon: Park Liter, DPM;  [facility];  Service: Podiatry;  Laterality: Left;   APPLICATION OF WOUND VAC Left 12/21/2019   Procedure: Application Of Wound Vac;  Surgeon: Park Liter, DPM;  [facility];  Service: Podiatry;  Laterality: Left;   BIOPSY  02/13/2018   Procedure: BIOPSY;  Surgeon: West Bali, MD;  [facility];  Service: Endoscopy;;  transverse colon biopsy, gastric biopsy   BONE BIOPSY Right 04/28/2023   Procedure: BONE BIOPSY;  Surgeon: Felecia Shelling, DPM;  [facility];  Service: Podiatry;  Laterality: Right;   COLONOSCOPY WITH PROPOFOL N/A 02/13/2018   Procedure:  COLONOSCOPY WITH PROPOFOL;  Surgeon: West Bali, MD;  [facility];  Service: Endoscopy;  Laterality: N/A;  11:15am   ESOPHAGOGASTRODUODENOSCOPY (EGD) WITH PROPOFOL N/A 02/13/2018   Procedure: ESOPHAGOGASTRODUODENOSCOPY (EGD) WITH PROPOFOL;  Surgeon: West Bali, MD;  [facility];  Service: Endoscopy;  Laterality: N/A;   I & D EXTREMITY Left 07/16/2019   Procedure: Insicion  AND DEBRIDEMENT EXTREMITY;  Surgeon: Park Liter, DPM;  [facility];  Service: Podiatry;  Laterality: Left;   I & D EXTREMITY Left 06/22/2020  Procedure: IRRIGATION AND DEBRIDEMENT EXTREMITY, left foot and ankle;  Surgeon: Park Liter, DPM;  [facility];  Service: Podiatry;  Laterality: Left;   I & D EXTREMITY Right 03/29/2021   Procedure: IRRIGATION AND DEBRIDEMENT EXTREMITY;  Surgeon: Park Liter, DPM;  [facility];  Service: Podiatry;  Laterality: Right;   INCISION AND DRAINAGE OF WOUND Left 10/13/2019   Procedure: IRRIGATION AND DEBRIDEMENT WOUND OF LEFT FOOT AND FIRST METATARSAL RESECTION;  Surgeon: Park Liter, DPM;  [facility];  Service: Podiatry;  Laterality: Left;   IRRIGATION AND DEBRIDEMENT FOOT Left 11/11/2019   Procedure: Left Foot Wound Irrigation and Debridement;  Surgeon: Park Liter, DPM;  [facility];  Service: Podiatry;  Laterality: Left;   Left arm     Left arm repair (tendon and artery)   PERCUTANEOUS PINNING  07/16/2019   Procedure: Open Reduction Percutaneous Pinning Extremity;  Surgeon: Park Liter, DPM;  [facility];  Service: Podiatry;;   PILONIDAL CYST EXCISION N/A 08/08/2014   Procedure: CYST EXCISION PILONIDAL EXTENSIVE;  Surgeon: Dalia Heading, MD;  [facility];  Service: General;  Laterality: N/A;   POLYPECTOMY  02/13/2018   Procedure: POLYPECTOMY;  Surgeon: West Bali, MD;  [facility];  Service: Endoscopy;;  transverse colon polyp hs, rectal polyps times 2   SAVORY DILATION N/A 02/13/2018    Procedure: SAVORY DILATION;  Surgeon: West Bali, MD;  [facility];  Service: Endoscopy;  Laterality: N/A;   TENDON LENGTHENING Right 12/04/2021   Procedure: TENDON LENGTHENING;  Surgeon: Asencion Islam, DPM;  [facility];  Service: Podiatry;  Laterality: Right;   TRANSMETATARSAL AMPUTATION Right 12/04/2021   Procedure: TRANSMETATARSAL AMPUTATION;  Surgeon: Asencion Islam, DPM;  [facility];  Service: Podiatry;  Laterality: Right;   WOUND DEBRIDEMENT Left 09/04/2019   Procedure: DEBRIDEMENT WOUND WITH COMPLEX  REPAIR OF DEHISCENCE;  Surgeon: Park Liter, DPM;  [facility];  Service: Podiatry;  Laterality: Left;  Leave patient on stretcher   WOUND DEBRIDEMENT Left 10/16/2019   Procedure: Left foot wound debridement and closure;  Surgeon: Park Liter, DPM;  [facility];  Service: Podiatry;  Laterality: Left;   WOUND DEBRIDEMENT Left 12/18/2019   Procedure: LEFT FOOT DEBRIDEMENT WITH PARTIAL INCISION OF INFECTED BONE;  Surgeon: Park Liter, DPM;  [facility];  Service: Podiatry;  Laterality: Left;  LEFT FOOT DEBRIDEMENT WITH PARTIAL INCISION OF INFECTED BONE   WOUND DEBRIDEMENT Right 04/01/2021   Procedure: Right foot wound debridement and irrigation and closure, bone biopsy;  Surgeon: Park Liter, DPM;  [facility];  Service: Podiatry;  Laterality: Right;       Review of Systems    Objective:    There were no vitals taken for this visit. Nursing note and vital signs reviewed.  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) 05/23/2022   LUE weakness 05/22/2022   Wound infection 05/20/2022   Alcohol use disorder 03/03/2022   Chest pain- w atypical and typical features 03/02/2022   Tobacco abuse 03/02/2022   Essential hypertension 03/02/2022   Gangrene right third toe associated with type 2 diabetes mellitus (HCC), likely osteomyelitis 12/03/2021   Type 2 diabetes mellitus (HCC) 12/03/2021   Right foot infection    Gangrene (HCC)    Osteomyelitis of great toe of right foot (HCC) 08/18/2021   Severe depression (HCC) 06/09/2021   Suicidal ideation 06/09/2021   Diabetic wet gangrene of the foot (HCC)    Unilateral complete BKA, left, 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   Acute blood loss anemia 01/04/2020   Asthma    Depression with anxiety    Hyperglycemia 12/17/2019   Diabetic foot ulcer (HCC)    Anemia, chronic disease 11/10/2019   Marijuana use 11/10/2019   Wound dehiscence 08/31/2019   Hyponatremia 08/14/2019   Normocytic anemia 08/14/2019   B12 deficiency 08/14/2019   Subacute osteomyelitis, left ankle and foot (HCC)    GERD (gastroesophageal reflux disease) 12/19/2018   Hepatic steatosis 06/18/2018   Gastritis, erosive    Dysphagia, idiopathic 12/27/2017   Rectal bleeding 12/27/2017   Intention tremor 06/03/2016   Left knee pain 12/04/2015   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   Diabetic neuropathy (HCC) 10/08/2014   Chest pain, rule out acute myocardial infarction 03/10/2014   Thrombocytopenia (HCC) 03/10/2014   Chronic alcohol abuse 03/10/2014   Type 2 diabetes mellitus with peripheral neuropathy (HCC)    HLD (hyperlipidemia)      Problem List Items Addressed This Visit   None    I am having [name] maintain his acetaminophen, folic acid, thiamine,  diclofenac Sodium, albuterol, ALPRAZolam, metFORMIN, morphine, oxycodone, amitriptyline, multivitamin with minerals, amLODipine, rosuvastatin, doxycycline, senna-docusate, and amoxicillin-clavulanate.   No orders of the defined types were placed in this encounter.    Follow-up: No follow-ups on file.   Marcos Eke, MSN, FNP-C Nurse Practitioner [facility] Main number: [phone]

## 2023-05-14 NOTE — Progress Notes (Signed)
Chief Complaint  Patient presents with   Routine Post Op    Patient came in today for right partial foot amputation/ dos 6.28.2024, rate of pain 6 out of 10, A1c- 5.6 BG- not taking TX: Cam boot, Doxycyline,     Subjective:  Patient presents today status post tendo Achilles lengthening with Chopart amputation right foot inpatient.  DOS: 04/28/2023.  Patient doing well.  No pain.  Dressings are intact since discharge from the hospital.  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) 05/23/2022   Depression    Diabetic neuropathy (HCC)    Edema of both lower extremities    Essential tremor    neurologist-- dr patel--- due to alcohol abuse   GERD (gastroesophageal reflux disease)    History of cellulitis 2020   left lower leg;   recurrent 03-25-2020   History of esophageal stricture    s/p  dilatation 02-13-2018   History of osteomyelitis 2020   left great toe     Hypercholesterolemia    Hypertension    followed by pcp  (nuclear stress test 03-11-2014 low risk w/ no ischemia, ef 65%   Normocytic anemia    followed by pcp   (03-18-2020 had transfusion's 02/ 2021)   Osteomyelitis of great toe of right foot (HCC) 08/18/2021   Peripheral vascular disease (HCC)    Severe depression (HCC) 06/09/2021   Status post incision and drainage followed by Dr Samuella Cota and pcp   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   (03-17-2020  pt states most of incision is healed with exception an area that is still draining,  daily dressing change),  foot is red but not warm to the touch and has swelling but has improved)   Suicidal ideation 06/09/2021   Syncope and collapse 06/07/2022   Syncope, near 06/20/2022   Thrombocytopenia (HCC)    chronic   Type 2 diabetes mellitus (HCC)    followed by  pcp---    (03-18-2020  pt stated does not check blood sugar)   Vaccine counseling 08/29/2022    Past Surgical History:  Procedure Laterality Date   AMPUTATION Left 10/16/2019   Procedure: Left partial second ray resection; placement of antibiotic beads;  Surgeon: Park Liter, DPM;  [facility];  Service: Podiatry;  Laterality: Left;   AMPUTATION Left 11/13/2019   Procedure: Left Midfoot Amputation - Transmetatarsal vs. Lisfranc; Placement antibiotic beads;  Surgeon: Park Liter, DPM;  [facility];  Service: Podiatry;  Laterality: Left;   AMPUTATION Left 12/21/2019   Procedure: Chopart Amputation left foot;  Surgeon: Park Liter, DPM;  [facility];  Service: Podiatry;  Laterality: Left;   AMPUTATION Left 06/24/2020   Procedure: LEFT BELOW KNEE AMPUTATION;  Surgeon: Nadara Mustard, MD;  Location[facility];  Service: Orthopedics;  Laterality: Left;   AMPUTATION Right 03/29/2021   Procedure: AMPUTATION RAY;  Surgeon: Park Liter, DPM;  [facility];  Service: Podiatry;  Laterality: Right;   AMPUTATION Right 04/28/2023   Procedure: RIGHT PARTIAL FOOT AMPUTATION;  Surgeon: Felecia Shelling, DPM;  [facility];  Service: Podiatry;  Laterality: Right;   AMPUTATION TOE Left 08/14/2019   Procedure: AMPUTATION GREAT TOE;  Surgeon: Park Liter, DPM;  [facility];  Service: Podiatry;  Laterality: Left;   APPLICATION OF WOUND VAC Left 12/21/2019   Procedure: Application Of Wound Vac;  Surgeon: Park Liter, DPM;  [facility];  Service: Podiatry;  Laterality: Left;   BIOPSY  02/13/2018   Procedure: BIOPSY;  Surgeon: West Bali, MD;  [facility];  Service: Endoscopy;;  transverse colon biopsy, gastric biopsy   BONE BIOPSY Right 04/28/2023   Procedure: BONE BIOPSY;  Surgeon: Felecia Shelling, DPM;  [facility];  Service: Podiatry;  Laterality: Right;   COLONOSCOPY WITH PROPOFOL N/A 02/13/2018   Procedure: COLONOSCOPY WITH PROPOFOL;  Surgeon:  West Bali, MD;  [facility];  Service: Endoscopy;  Laterality: N/A;  11:15am   ESOPHAGOGASTRODUODENOSCOPY (EGD) WITH PROPOFOL N/A 02/13/2018   Procedure: ESOPHAGOGASTRODUODENOSCOPY (EGD) WITH PROPOFOL;  Surgeon: West Bali, MD;  [facility];  Service: Endoscopy;  Laterality: N/A;   I & D EXTREMITY Left 07/16/2019   Procedure: Insicion  AND DEBRIDEMENT EXTREMITY;  Surgeon: Park Liter, DPM;  [facility];  Service: Podiatry;  Laterality: Left;   I & D EXTREMITY Left 06/22/2020   Procedure: IRRIGATION AND DEBRIDEMENT EXTREMITY, left foot and ankle;  Surgeon: Park Liter, DPM;  [facility];  Service: Podiatry;  Laterality: Left;   I & D EXTREMITY Right 03/29/2021   Procedure: IRRIGATION AND DEBRIDEMENT EXTREMITY;  Surgeon: Park Liter, DPM;  [facility];  Service: Podiatry;  Laterality: Right;   INCISION AND DRAINAGE OF WOUND Left 10/13/2019   Procedure: IRRIGATION AND DEBRIDEMENT WOUND OF LEFT FOOT AND FIRST METATARSAL RESECTION;  Surgeon: Park Liter, DPM;  [facility];  Service: Podiatry;  Laterality: Left;   IRRIGATION AND DEBRIDEMENT FOOT Left 11/11/2019   Procedure: Left Foot Wound Irrigation and Debridement;  Surgeon: Park Liter, DPM;  [facility];  Service: Podiatry;  Laterality: Left;   Left arm     Left arm repair (tendon and artery)   PERCUTANEOUS PINNING  07/16/2019   Procedure: Open Reduction Percutaneous Pinning Extremity;  Surgeon: Park Liter, DPM;  [facility];  Service: Podiatry;;   PILONIDAL CYST EXCISION N/A 08/08/2014   Procedure: CYST EXCISION PILONIDAL EXTENSIVE;  Surgeon: Dalia Heading, MD;  [facility];  Service: General;  Laterality: N/A;   POLYPECTOMY  02/13/2018   Procedure: POLYPECTOMY;  Surgeon: West Bali, MD;  [facility];  Service: Endoscopy;;  transverse colon polyp hs, rectal polyps times 2   SAVORY DILATION N/A 02/13/2018   Procedure: SAVORY DILATION;   Surgeon: West Bali, MD;  [facility];  Service: Endoscopy;  Laterality: N/A;   TENDON LENGTHENING Right 12/04/2021   Procedure: TENDON LENGTHENING;  Surgeon: Asencion Islam, DPM;  [facility];  Service: Podiatry;  Laterality: Right;   TRANSMETATARSAL AMPUTATION Right 12/04/2021   Procedure: TRANSMETATARSAL AMPUTATION;  Surgeon: Asencion Islam, DPM;  [facility];  Service: Podiatry;  Laterality: Right;   WOUND DEBRIDEMENT Left 09/04/2019   Procedure: DEBRIDEMENT WOUND WITH COMPLEX  REPAIR OF DEHISCENCE;  Surgeon: Park Liter, DPM;  Location: [facility];  Service: Podiatry;  Laterality: Left;  Leave patient on stretcher   WOUND DEBRIDEMENT Left 10/16/2019   Procedure: Left foot wound debridement and closure;  Surgeon: Park Liter, DPM;  [facility];  Service: Podiatry;  Laterality: Left;   WOUND DEBRIDEMENT Left 12/18/2019  Procedure: LEFT FOOT DEBRIDEMENT WITH PARTIAL INCISION OF INFECTED BONE;  Surgeon: Park Liter, DPM;  [facility];  Service: Podiatry;  Laterality: Left;  LEFT FOOT DEBRIDEMENT WITH PARTIAL INCISION OF INFECTED BONE   WOUND DEBRIDEMENT Right 04/01/2021   Procedure: Right foot wound debridement and irrigation and closure, bone biopsy;  Surgeon: Park Liter, DPM;  [facility];  Service: Podiatry;  Laterality: Right;    No Known Allergies  Objective/Physical Exam Neurovascular status intact.  Incision well coapted with sutures and staples intact.  Overall stable amputation site.  There is some slight maceration noted but no dehiscence of the ulcer  Radiographic Exam inpatient postop 04/28/2023:  IMPRESSION: There is interval removal of metatarsal stumps, navicular and distal row tarsals. There are pockets of air in the soft tissues from recent surgery. No focal lytic lesions are seen in remaining bony structures. Plantar spur is seen in calcaneus.  Assessment: 1. s/p Chopart amputation w/ TAL RLE. DOS:  04/28/2023   Plan of Care:  -Patient was evaluated.  -Dressings changed.  Leave clean dry and intact -Continue strict NWB -Continue oral antibiotics.  Refill provided Augmentin 875/125 mg -Return to clinic 1 week  Felecia Shelling, DPM [facility]  Dr. Felecia Shelling, DPM    [address] [place], [place] [address]                Office [phone]  Fax [phone]

## 2023-05-24 ENCOUNTER — Ambulatory Visit (INDEPENDENT_AMBULATORY_CARE_PROVIDER_SITE_OTHER): Payer: Medicare HMO | Admitting: Podiatry

## 2023-05-24 ENCOUNTER — Encounter: Payer: Self-pay | Admitting: Podiatry

## 2023-05-24 VITALS — BP 132/76 | HR 84 | Ht 74.0 in

## 2023-05-24 DIAGNOSIS — L97514 Non-pressure chronic ulcer of other part of right foot with necrosis of bone: Secondary | ICD-10-CM

## 2023-05-24 NOTE — Progress Notes (Signed)
No chief complaint on file.   Subjective:  Patient presents today status post tendo Achilles lengthening with Chopart amputation right foot inpatient.  DOS: 04/28/2023. PSxHx BKA LLE. Patient states that the amputation site is not doing very well.  Dressings are intact with drainage strikethrough.  Moderate malodor.  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) 05/23/2022   Depression    Diabetic neuropathy (HCC)    Edema of both lower extremities    Essential tremor    neurologist-- dr patel--- due to alcohol abuse   GERD (gastroesophageal reflux disease)    History of cellulitis 2020   left lower leg;   recurrent 03-25-2020   History of esophageal stricture    s/p  dilatation 02-13-2018   History of osteomyelitis 2020   left great toe     Hypercholesterolemia    Hypertension    followed by pcp  (nuclear stress test 03-11-2014 low risk w/ no ischemia, ef 65%   Normocytic anemia    followed by pcp   (03-18-2020 had transfusion's 02/ 2021)   Osteomyelitis of great toe of right foot (HCC) 08/18/2021   Peripheral vascular disease (HCC)    Severe depression (HCC) 06/09/2021   Status post incision and drainage followed by Dr Samuella Cota and pcp   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   (03-17-2020  pt states most of incision is healed with exception an area that is still draining,  daily dressing change),  foot is red but not warm to the touch and has swelling but has improved)   Suicidal ideation 06/09/2021   Syncope and collapse 06/07/2022   Syncope, near 06/20/2022   Thrombocytopenia (HCC)    chronic   Type 2 diabetes mellitus (HCC)    followed by pcp---    (03-18-2020  pt stated does not check blood sugar)   Vaccine counseling  08/29/2022    Past Surgical History:  Procedure Laterality Date   AMPUTATION Left 10/16/2019   Procedure: Left partial second ray resection; placement of antibiotic beads;  Surgeon: Park Liter, DPM;  Location: [facility];  Service: Podiatry;  Laterality: Left;   AMPUTATION Left 11/13/2019   Procedure: Left Midfoot Amputation - Transmetatarsal vs. Lisfranc; Placement antibiotic beads;  Surgeon: Park Liter, DPM;  Location: [facility];  Service: Podiatry;  Laterality: Left;   AMPUTATION Left 12/21/2019   Procedure: Chopart Amputation left foot;  Surgeon: Park Liter, DPM;  [facility]: [facility];  Service: Podiatry;  Laterality: Left;   AMPUTATION Left 06/24/2020   Procedure: LEFT BELOW KNEE AMPUTATION;  Surgeon: Nadara Mustard, MD;  Location: [facility];  Service: Orthopedics;  Laterality: Left;   AMPUTATION Right 03/29/2021   Procedure: AMPUTATION RAY;  Surgeon: Park Liter, DPM;  Location: [facility];  Service: Podiatry;  Laterality: Right;   AMPUTATION Right 04/28/2023   Procedure: RIGHT PARTIAL FOOT AMPUTATION;  Surgeon: Felecia Shelling, DPM;  Location: [facility];  Service: Podiatry;  Laterality: Right;   AMPUTATION TOE Left 08/14/2019   Procedure: AMPUTATION GREAT TOE;  Surgeon: Park Liter, DPM;  Location: [facility];  Service: Podiatry;  Laterality: Left;   APPLICATION OF WOUND VAC  Left 12/21/2019   Procedure: Application Of Wound Vac;  Surgeon: Park Liter, DPM;  Location: [facility];  Service: Podiatry;  Laterality: Left;   BIOPSY  02/13/2018   Procedure: BIOPSY;  Surgeon: West Bali, MD;  [facility];  Service: Endoscopy;;  transverse colon biopsy, gastric biopsy   BONE BIOPSY Right 04/28/2023   Procedure: BONE BIOPSY;  Surgeon: Felecia Shelling, DPM;  Location: [facility];  Service: Podiatry;  Laterality: Right;   COLONOSCOPY WITH PROPOFOL N/A 02/13/2018   Procedure: COLONOSCOPY WITH PROPOFOL;  Surgeon: West Bali, MD;  [facility]: [facility];  Service: Endoscopy;  Laterality: N/A;   11:15am   ESOPHAGOGASTRODUODENOSCOPY (EGD) WITH PROPOFOL N/A 02/13/2018   Procedure: ESOPHAGOGASTRODUODENOSCOPY (EGD) WITH PROPOFOL;  Surgeon: West Bali, MD;  [facility]: [facility];  Service: Endoscopy;  Laterality: N/A;   I & D EXTREMITY Left 07/16/2019   Procedure: Insicion  AND DEBRIDEMENT EXTREMITY;  Surgeon: Park Liter, DPM;  [facility];  Service: Podiatry;  Laterality: Left;   I & D EXTREMITY Left 06/22/2020   Procedure: IRRIGATION AND DEBRIDEMENT EXTREMITY, left foot and ankle;  Surgeon: Park Liter, DPM;  [facility];  Service: Podiatry;  Laterality: Left;   I & D EXTREMITY Right 03/29/2021   Procedure: IRRIGATION AND DEBRIDEMENT EXTREMITY;  Surgeon: Park Liter, DPM;  [facility];  Service: Podiatry;  Laterality: Right;   INCISION AND DRAINAGE OF WOUND Left 10/13/2019   Procedure: IRRIGATION AND DEBRIDEMENT WOUND OF LEFT FOOT AND FIRST METATARSAL RESECTION;  Surgeon: Park Liter, DPM;  Location: [facility];  Service: Podiatry;  Laterality: Left;   IRRIGATION AND DEBRIDEMENT FOOT Left 11/11/2019   Procedure: Left Foot Wound Irrigation and Debridement;  Surgeon: Park Liter, DPM;  Location: [facility];  Service: Podiatry;  Laterality: Left;   Left arm     Left arm repair (tendon and artery)   PERCUTANEOUS PINNING  07/16/2019   Procedure: Open Reduction Percutaneous Pinning Extremity;  Surgeon: Park Liter, DPM;  [facility];  Service: Podiatry;;   PILONIDAL CYST EXCISION N/A 08/08/2014   Procedure: CYST EXCISION PILONIDAL EXTENSIVE;  Surgeon: Dalia Heading, MD;  Location: [facility];  Service: General;  Laterality: N/A;   POLYPECTOMY  02/13/2018   Procedure: POLYPECTOMY;  Surgeon: West Bali, MD;  [facility];  Service: Endoscopy;;  transverse colon polyp hs, rectal polyps times 2   SAVORY DILATION N/A 02/13/2018   Procedure: SAVORY DILATION;  Surgeon: West Bali, MD;  [facility]: [facility];  Service: Endoscopy;  Laterality:  N/A;   TENDON LENGTHENING Right 12/04/2021   Procedure: TENDON LENGTHENING;  Surgeon: Asencion Islam, DPM;  [facility];  Service: Podiatry;  Laterality: Right;   TRANSMETATARSAL AMPUTATION Right 12/04/2021   Procedure: TRANSMETATARSAL AMPUTATION;  Surgeon: Asencion Islam, DPM;  [facility];  Service: Podiatry;  Laterality: Right;   WOUND DEBRIDEMENT Left 09/04/2019   Procedure: DEBRIDEMENT WOUND WITH COMPLEX  REPAIR OF DEHISCENCE;  Surgeon: Park Liter, DPM;  Location: [facility];  Service: Podiatry;  Laterality: Left;  Leave patient on stretcher   WOUND DEBRIDEMENT Left 10/16/2019   Procedure: Left foot wound debridement and closure;  Surgeon: Park Liter, DPM;  Location: [facility];  Service: Podiatry;  Laterality: Left;   WOUND DEBRIDEMENT Left 12/18/2019   Procedure: LEFT FOOT DEBRIDEMENT WITH PARTIAL INCISION OF INFECTED BONE;  Surgeon: Park Liter, DPM;  [facility]: [facility];  Service: Podiatry;  Laterality: Left;  LEFT FOOT DEBRIDEMENT WITH PARTIAL INCISION OF INFECTED BONE   WOUND DEBRIDEMENT Right 04/01/2021   Procedure: Right foot wound debridement and irrigation and closure, bone biopsy;  Surgeon: Park Liter, DPM;  [facility]: [facility];  Service: Podiatry;  Laterality: Right;    No Known Allergies   RT foot 05/24/2023  Objective/Physical Exam Neurovascular status intact.  Since last visit there is been significant breakdown of the amputation site.  Heavy maceration with necrotic debris noted.  Complete dehiscence with serous drainage and moderate malodor.  Radiographic Exam inpatient postop 04/28/2023:  IMPRESSION: There is interval removal of metatarsal stumps, navicular and distal row tarsals. There are pockets of air in the soft tissues from recent surgery. No focal lytic lesions are seen in remaining bony structures. Plantar spur is seen in calcaneus.  Assessment: 1. s/p Chopart amputation w/ TAL RLE. DOS: 04/28/2023 2.  Soft tissue breakdown of  the amputation stump  Plan of Care:  -Patient was evaluated.  - Unfortunately there appears to be complete breakdown of the amputation stump to the right lower extremity.  Patient understands this last surgery was an attempt at limb salvage so he has a foot to transition since he has BKA to the left lower extremity.  Unfortunately the soft tissue around the amputation stump is failed with residual infection -Continue Betadine daily -Recommend the patient go to the emergency department for evaluation.  Recommend consulting orthopedics, Dr. Lajoyce Corners, for likely BKA RLE.  Patient is aware.  Dr. Lajoyce Corners did his left below-knee amputation about 4 years ago -Return to clinic as needed   Felecia Shelling, DPM [facility]  Dr. Felecia Shelling, DPM    [address] [place], [place] [address]                Office [phone]  Fax [phone]

## 2023-05-29 ENCOUNTER — Other Ambulatory Visit: Payer: Self-pay

## 2023-05-29 ENCOUNTER — Emergency Department (HOSPITAL_COMMUNITY): Payer: Medicare HMO

## 2023-05-29 ENCOUNTER — Inpatient Hospital Stay (HOSPITAL_COMMUNITY)
Admission: EM | Admit: 2023-05-29 | Discharge: 2023-06-09 | DRG: 854 | Payer: Medicare HMO | Attending: Internal Medicine | Admitting: Internal Medicine

## 2023-05-29 ENCOUNTER — Encounter (HOSPITAL_COMMUNITY): Payer: Self-pay

## 2023-05-29 DIAGNOSIS — Z89512 Acquired absence of left leg below knee: Secondary | ICD-10-CM | POA: Diagnosis not present

## 2023-05-29 DIAGNOSIS — E119 Type 2 diabetes mellitus without complications: Secondary | ICD-10-CM

## 2023-05-29 DIAGNOSIS — T8781 Dehiscence of amputation stump: Secondary | ICD-10-CM | POA: Diagnosis present

## 2023-05-29 DIAGNOSIS — Z4781 Encounter for orthopedic aftercare following surgical amputation: Secondary | ICD-10-CM | POA: Diagnosis not present

## 2023-05-29 DIAGNOSIS — Z6830 Body mass index (BMI) 30.0-30.9, adult: Secondary | ICD-10-CM

## 2023-05-29 DIAGNOSIS — G546 Phantom limb syndrome with pain: Secondary | ICD-10-CM | POA: Diagnosis not present

## 2023-05-29 DIAGNOSIS — J45909 Unspecified asthma, uncomplicated: Secondary | ICD-10-CM | POA: Diagnosis present

## 2023-05-29 DIAGNOSIS — L97913 Non-pressure chronic ulcer of unspecified part of right lower leg with necrosis of muscle: Secondary | ICD-10-CM | POA: Diagnosis not present

## 2023-05-29 DIAGNOSIS — R739 Hyperglycemia, unspecified: Secondary | ICD-10-CM | POA: Diagnosis not present

## 2023-05-29 DIAGNOSIS — I10 Essential (primary) hypertension: Secondary | ICD-10-CM | POA: Diagnosis present

## 2023-05-29 DIAGNOSIS — Z9889 Other specified postprocedural states: Secondary | ICD-10-CM

## 2023-05-29 DIAGNOSIS — B964 Proteus (mirabilis) (morganii) as the cause of diseases classified elsewhere: Secondary | ICD-10-CM | POA: Diagnosis present

## 2023-05-29 DIAGNOSIS — G621 Alcoholic polyneuropathy: Secondary | ICD-10-CM | POA: Diagnosis present

## 2023-05-29 DIAGNOSIS — F32A Depression, unspecified: Secondary | ICD-10-CM | POA: Diagnosis present

## 2023-05-29 DIAGNOSIS — I25810 Atherosclerosis of coronary artery bypass graft(s) without angina pectoris: Secondary | ICD-10-CM | POA: Diagnosis not present

## 2023-05-29 DIAGNOSIS — G8929 Other chronic pain: Secondary | ICD-10-CM | POA: Diagnosis present

## 2023-05-29 DIAGNOSIS — E1152 Type 2 diabetes mellitus with diabetic peripheral angiopathy with gangrene: Secondary | ICD-10-CM | POA: Diagnosis not present

## 2023-05-29 DIAGNOSIS — G250 Essential tremor: Secondary | ICD-10-CM | POA: Diagnosis present

## 2023-05-29 DIAGNOSIS — F17210 Nicotine dependence, cigarettes, uncomplicated: Secondary | ICD-10-CM | POA: Diagnosis not present

## 2023-05-29 DIAGNOSIS — E1151 Type 2 diabetes mellitus with diabetic peripheral angiopathy without gangrene: Secondary | ICD-10-CM | POA: Diagnosis present

## 2023-05-29 DIAGNOSIS — R7881 Bacteremia: Secondary | ICD-10-CM | POA: Diagnosis not present

## 2023-05-29 DIAGNOSIS — D696 Thrombocytopenia, unspecified: Secondary | ICD-10-CM | POA: Diagnosis present

## 2023-05-29 DIAGNOSIS — E669 Obesity, unspecified: Secondary | ICD-10-CM | POA: Diagnosis present

## 2023-05-29 DIAGNOSIS — A4181 Sepsis due to Enterococcus: Secondary | ICD-10-CM | POA: Diagnosis not present

## 2023-05-29 DIAGNOSIS — G4710 Hypersomnia, unspecified: Secondary | ICD-10-CM | POA: Diagnosis present

## 2023-05-29 DIAGNOSIS — Z8249 Family history of ischemic heart disease and other diseases of the circulatory system: Secondary | ICD-10-CM

## 2023-05-29 DIAGNOSIS — I959 Hypotension, unspecified: Secondary | ICD-10-CM | POA: Diagnosis not present

## 2023-05-29 DIAGNOSIS — K219 Gastro-esophageal reflux disease without esophagitis: Secondary | ICD-10-CM | POA: Diagnosis not present

## 2023-05-29 DIAGNOSIS — M86371 Chronic multifocal osteomyelitis, right ankle and foot: Secondary | ICD-10-CM | POA: Diagnosis present

## 2023-05-29 DIAGNOSIS — E1142 Type 2 diabetes mellitus with diabetic polyneuropathy: Secondary | ICD-10-CM | POA: Diagnosis present

## 2023-05-29 DIAGNOSIS — M86171 Other acute osteomyelitis, right ankle and foot: Secondary | ICD-10-CM | POA: Diagnosis not present

## 2023-05-29 DIAGNOSIS — E7800 Pure hypercholesterolemia, unspecified: Secondary | ICD-10-CM | POA: Diagnosis present

## 2023-05-29 DIAGNOSIS — R600 Localized edema: Secondary | ICD-10-CM | POA: Diagnosis not present

## 2023-05-29 DIAGNOSIS — Z602 Problems related to living alone: Secondary | ICD-10-CM | POA: Diagnosis present

## 2023-05-29 DIAGNOSIS — G894 Chronic pain syndrome: Secondary | ICD-10-CM

## 2023-05-29 DIAGNOSIS — M1990 Unspecified osteoarthritis, unspecified site: Secondary | ICD-10-CM | POA: Diagnosis present

## 2023-05-29 DIAGNOSIS — Z555 Less than a high school diploma: Secondary | ICD-10-CM

## 2023-05-29 DIAGNOSIS — M869 Osteomyelitis, unspecified: Secondary | ICD-10-CM | POA: Diagnosis not present

## 2023-05-29 DIAGNOSIS — E538 Deficiency of other specified B group vitamins: Secondary | ICD-10-CM | POA: Diagnosis present

## 2023-05-29 DIAGNOSIS — F1010 Alcohol abuse, uncomplicated: Secondary | ICD-10-CM | POA: Diagnosis present

## 2023-05-29 DIAGNOSIS — L02611 Cutaneous abscess of right foot: Secondary | ICD-10-CM | POA: Diagnosis present

## 2023-05-29 DIAGNOSIS — Y835 Amputation of limb(s) as the cause of abnormal reaction of the patient, or of later complication, without mention of misadventure at the time of the procedure: Secondary | ICD-10-CM | POA: Diagnosis present

## 2023-05-29 DIAGNOSIS — F17290 Nicotine dependence, other tobacco product, uncomplicated: Secondary | ICD-10-CM | POA: Diagnosis present

## 2023-05-29 DIAGNOSIS — Z9151 Personal history of suicidal behavior: Secondary | ICD-10-CM

## 2023-05-29 DIAGNOSIS — F411 Generalized anxiety disorder: Secondary | ICD-10-CM | POA: Diagnosis present

## 2023-05-29 DIAGNOSIS — M86271 Subacute osteomyelitis, right ankle and foot: Secondary | ICD-10-CM | POA: Diagnosis not present

## 2023-05-29 DIAGNOSIS — M86179 Other acute osteomyelitis, unspecified ankle and foot: Secondary | ICD-10-CM

## 2023-05-29 DIAGNOSIS — K5901 Slow transit constipation: Secondary | ICD-10-CM | POA: Diagnosis not present

## 2023-05-29 DIAGNOSIS — R52 Pain, unspecified: Secondary | ICD-10-CM | POA: Diagnosis not present

## 2023-05-29 DIAGNOSIS — E1169 Type 2 diabetes mellitus with other specified complication: Secondary | ICD-10-CM | POA: Diagnosis present

## 2023-05-29 DIAGNOSIS — F322 Major depressive disorder, single episode, severe without psychotic features: Secondary | ICD-10-CM | POA: Diagnosis not present

## 2023-05-29 DIAGNOSIS — Z7984 Long term (current) use of oral hypoglycemic drugs: Secondary | ICD-10-CM

## 2023-05-29 DIAGNOSIS — E785 Hyperlipidemia, unspecified: Secondary | ICD-10-CM | POA: Diagnosis present

## 2023-05-29 DIAGNOSIS — E871 Hypo-osmolality and hyponatremia: Secondary | ICD-10-CM | POA: Diagnosis present

## 2023-05-29 DIAGNOSIS — Z79891 Long term (current) use of opiate analgesic: Secondary | ICD-10-CM

## 2023-05-29 DIAGNOSIS — Z809 Family history of malignant neoplasm, unspecified: Secondary | ICD-10-CM

## 2023-05-29 DIAGNOSIS — G548 Other nerve root and plexus disorders: Secondary | ICD-10-CM | POA: Diagnosis not present

## 2023-05-29 DIAGNOSIS — Z86010 Personal history of colonic polyps: Secondary | ICD-10-CM

## 2023-05-29 DIAGNOSIS — Z79899 Other long term (current) drug therapy: Secondary | ICD-10-CM | POA: Diagnosis not present

## 2023-05-29 DIAGNOSIS — Z794 Long term (current) use of insulin: Secondary | ICD-10-CM | POA: Diagnosis not present

## 2023-05-29 DIAGNOSIS — F419 Anxiety disorder, unspecified: Secondary | ICD-10-CM | POA: Diagnosis present

## 2023-05-29 DIAGNOSIS — Z89511 Acquired absence of right leg below knee: Secondary | ICD-10-CM | POA: Diagnosis not present

## 2023-05-29 DIAGNOSIS — I2510 Atherosclerotic heart disease of native coronary artery without angina pectoris: Secondary | ICD-10-CM | POA: Diagnosis present

## 2023-05-29 DIAGNOSIS — M7989 Other specified soft tissue disorders: Secondary | ICD-10-CM | POA: Diagnosis not present

## 2023-05-29 DIAGNOSIS — E1159 Type 2 diabetes mellitus with other circulatory complications: Secondary | ICD-10-CM | POA: Diagnosis not present

## 2023-05-29 LAB — CBG MONITORING, ED: Glucose-Capillary: 122 mg/dL — ABNORMAL HIGH (ref 70–99)

## 2023-05-29 LAB — BASIC METABOLIC PANEL
Anion gap: 12 (ref 5–15)
BUN: 9 mg/dL (ref 6–20)
CO2: 22 mmol/L (ref 22–32)
Calcium: 8.7 mg/dL — ABNORMAL LOW (ref 8.9–10.3)
Chloride: 99 mmol/L (ref 98–111)
Creatinine, Ser: 0.83 mg/dL (ref 0.61–1.24)
GFR, Estimated: >60 mL/min (ref >60)
Glucose, Bld: 166 mg/dL — ABNORMAL HIGH (ref 70–99)
Potassium: 3.8 mmol/L (ref 3.5–5.1)
Sodium: 133 mmol/L — ABNORMAL LOW (ref 135–145)

## 2023-05-29 LAB — CBC WITH DIFFERENTIAL/PLATELET
Abs Immature Granulocytes: 0.08 10*3/uL — ABNORMAL HIGH (ref 0.00–0.07)
Basophils Absolute: 0.1 10*3/uL (ref 0.0–0.1)
Basophils Relative: 1 %
Eosinophils Absolute: 0.5 10*3/uL (ref 0.0–0.5)
Eosinophils Relative: 4 %
HCT: 44.3 % (ref 39.0–52.0)
Hemoglobin: 14.8 g/dL (ref 13.0–17.0)
Immature Granulocytes: 1 %
Lymphocytes Relative: 25 %
Lymphs Abs: 2.5 10*3/uL (ref 0.7–4.0)
MCH: 31.3 pg (ref 26.0–34.0)
MCHC: 33.4 g/dL (ref 30.0–36.0)
MCV: 93.7 fL (ref 80.0–100.0)
Monocytes Absolute: 0.4 10*3/uL (ref 0.1–1.0)
Monocytes Relative: 4 %
Neutro Abs: 6.6 10*3/uL (ref 1.7–7.7)
Neutrophils Relative %: 65 %
Platelets: 434 10*3/uL — ABNORMAL HIGH (ref 150–400)
RBC: 4.73 MIL/uL (ref 4.22–5.81)
RDW: 13.1 % (ref 11.5–15.5)
WBC: 10.2 10*3/uL (ref 4.0–10.5)
nRBC: 0 % (ref 0.0–0.2)

## 2023-05-29 LAB — I-STAT CG4 LACTIC ACID, ED
Lactic Acid, Venous: 1.5 mmol/L (ref 0.5–1.9)
Lactic Acid, Venous: 1.5 mmol/L (ref 0.5–1.9)

## 2023-05-29 MED ORDER — VANCOMYCIN HCL 1250 MG/250ML IV SOLN
1250.0000 mg | Freq: Two times a day (BID) | INTRAVENOUS | Status: DC
  Administered 2023-05-30 – 2023-05-31 (×3): 1250 mg via INTRAVENOUS
  Filled 2023-05-29 (×4): qty 250

## 2023-05-29 MED ORDER — METRONIDAZOLE 500 MG PO TABS
500.0000 mg | ORAL_TABLET | Freq: Two times a day (BID) | ORAL | Status: DC
  Administered 2023-05-29 – 2023-05-31 (×4): 500 mg via ORAL
  Filled 2023-05-29 (×4): qty 1

## 2023-05-29 MED ORDER — ONDANSETRON HCL 4 MG/2ML IJ SOLN: 4.0000 mg | Freq: Four times a day (QID) | INTRAMUSCULAR | Status: DC | PRN

## 2023-05-29 MED ORDER — ALPRAZOLAM 0.5 MG PO TABS
1.0000 mg | ORAL_TABLET | Freq: Four times a day (QID) | ORAL | Status: DC | PRN
  Administered 2023-06-03 – 2023-06-08 (×6): 1 mg via ORAL
  Filled 2023-05-29 (×6): qty 2

## 2023-05-29 MED ORDER — PIPERACILLIN-TAZOBACTAM 3.375 G IVPB 30 MIN
3.3750 g | Freq: Once | INTRAVENOUS | Status: DC
  Filled 2023-05-29: qty 50

## 2023-05-29 MED ORDER — OXYCODONE HCL 5 MG PO TABS
30.0000 mg | ORAL_TABLET | Freq: Three times a day (TID) | ORAL | Status: DC | PRN
  Administered 2023-05-30 – 2023-06-09 (×11): 30 mg via ORAL
  Filled 2023-05-29 (×13): qty 6

## 2023-05-29 MED ORDER — VANCOMYCIN HCL 10 G IV SOLR
2500.0000 mg | Freq: Once | INTRAVENOUS | Status: AC
  Administered 2023-05-29: 2500 mg via INTRAVENOUS
  Filled 2023-05-29: qty 2500

## 2023-05-29 MED ORDER — MORPHINE SULFATE ER 15 MG PO TBCR
30.0000 mg | EXTENDED_RELEASE_TABLET | Freq: Two times a day (BID) | ORAL | Status: DC
  Administered 2023-05-29 – 2023-06-05 (×13): 30 mg via ORAL
  Filled 2023-05-29 (×13): qty 2

## 2023-05-29 MED ORDER — SODIUM CHLORIDE 0.9 % IV SOLN
2.0000 g | INTRAVENOUS | Status: DC
  Administered 2023-05-29 – 2023-05-30 (×2): 2 g via INTRAVENOUS
  Filled 2023-05-29 (×2): qty 20

## 2023-05-29 MED ORDER — ONDANSETRON HCL 4 MG PO TABS: 4.0000 mg | ORAL_TABLET | Freq: Four times a day (QID) | ORAL | Status: DC | PRN

## 2023-05-29 MED ORDER — ROSUVASTATIN CALCIUM 20 MG PO TABS
20.0000 mg | ORAL_TABLET | Freq: Every day | ORAL | Status: DC
  Administered 2023-05-30 – 2023-06-09 (×10): 20 mg via ORAL
  Filled 2023-05-29 (×10): qty 1

## 2023-05-29 MED ORDER — ACETAMINOPHEN 650 MG RE SUPP: 650.0000 mg | Freq: Four times a day (QID) | RECTAL | Status: DC | PRN

## 2023-05-29 MED ORDER — AMITRIPTYLINE HCL 50 MG PO TABS
100.0000 mg | ORAL_TABLET | Freq: Every day | ORAL | Status: DC
  Administered 2023-05-29 – 2023-06-04 (×7): 100 mg via ORAL
  Filled 2023-05-29 (×7): qty 2

## 2023-05-29 MED ORDER — SENNOSIDES-DOCUSATE SODIUM 8.6-50 MG PO TABS
1.0000 | ORAL_TABLET | Freq: Every evening | ORAL | Status: DC | PRN
  Administered 2023-06-03: 1 via ORAL
  Filled 2023-05-29: qty 1

## 2023-05-29 MED ORDER — ACETAMINOPHEN 325 MG PO TABS
650.0000 mg | ORAL_TABLET | Freq: Four times a day (QID) | ORAL | Status: DC | PRN
  Administered 2023-05-29 – 2023-06-08 (×6): 650 mg via ORAL
  Filled 2023-05-29 (×6): qty 2

## 2023-05-29 MED ORDER — HYDROMORPHONE HCL 1 MG/ML IJ SOLN
1.0000 mg | INTRAMUSCULAR | Status: DC | PRN
  Administered 2023-05-30: 1 mg via INTRAVENOUS
  Filled 2023-05-29: qty 1

## 2023-05-29 MED ORDER — AMLODIPINE BESYLATE 10 MG PO TABS
10.0000 mg | ORAL_TABLET | Freq: Every day | ORAL | Status: DC
  Administered 2023-05-30 – 2023-06-09 (×10): 10 mg via ORAL
  Filled 2023-05-29 (×10): qty 1

## 2023-05-29 MED ORDER — INSULIN ASPART 100 UNIT/ML IJ SOLN
0.0000 [IU] | INTRAMUSCULAR | Status: DC
  Administered 2023-05-29: 2 [IU] via SUBCUTANEOUS
  Administered 2023-05-30 – 2023-06-01 (×7): 1 [IU] via SUBCUTANEOUS
  Administered 2023-06-01: 2 [IU] via SUBCUTANEOUS
  Administered 2023-06-02 – 2023-06-03 (×4): 1 [IU] via SUBCUTANEOUS
  Administered 2023-06-03: 2 [IU] via SUBCUTANEOUS
  Administered 2023-06-04 (×3): 1 [IU] via SUBCUTANEOUS
  Administered 2023-06-04: 2 [IU] via SUBCUTANEOUS
  Administered 2023-06-05 (×2): 1 [IU] via SUBCUTANEOUS
  Administered 2023-06-05: 2 [IU] via SUBCUTANEOUS
  Administered 2023-06-05: 1 [IU] via SUBCUTANEOUS
  Administered 2023-06-06: 2 [IU] via SUBCUTANEOUS
  Administered 2023-06-06 (×4): 1 [IU] via SUBCUTANEOUS
  Administered 2023-06-06: 2 [IU] via SUBCUTANEOUS
  Administered 2023-06-07: 1 [IU] via SUBCUTANEOUS
  Administered 2023-06-07: 3 [IU] via SUBCUTANEOUS
  Administered 2023-06-07: 2 [IU] via SUBCUTANEOUS
  Administered 2023-06-07 (×2): 1 [IU] via SUBCUTANEOUS
  Administered 2023-06-08 (×2): 2 [IU] via SUBCUTANEOUS
  Administered 2023-06-09: 1 [IU] via SUBCUTANEOUS
  Administered 2023-06-09: 2 [IU] via SUBCUTANEOUS

## 2023-05-29 NOTE — Progress Notes (Signed)
Pharmacy Antibiotic Note  Eugene Diaz is a 57 y.o. male admitted on 05/29/2023 with  suspected diabetic foot infection w/ high risk for MRSA .  History of polymicrobial infection and BKA. Pharmacy has been consulted for Vancomycin dosing. Pt presented w/ worsening pain/drainage from prior RLE surgical site, fever and chills. X-ray of RLE sig for osteomyelitis. Febrile. WBC 10.2. Scr 0.83. LA 1.5.  Plan: Vancomycin 2500 mg IV LD x1, then vancomycin 1250 mg IV every 12 hours (eAUC 468.4, Css max 33.2, Css min 11.9) Ceftriaxone 2 g IV every 24 hours Monitor daily Scr, CBC, temp, and for clinical signs of improvement  F/u cultures and de-escalate antibiotics as indicated  Weight: 108.9 kg (240 lb)  Temp (24hrs), Avg:99.8 F (37.7 C), Min:98.8 F (37.1 C), Max:100.7 F (38.2 C)  Recent Labs  Lab 05/29/23 1601 05/29/23 1625  WBC 10.2  --   CREATININE 0.83  --   LATICACIDVEN  --  1.5    Estimated Creatinine Clearance: 127.5 mL/min (by C-G formula based on SCr of 0.83 mg/dL).    No Known Allergies  Antimicrobials this admission: Vancomycin 7/29 >>  Ceftriaxone 7/29 >>   Microbiology results: 7/29 BCx: ip  Thank you for allowing pharmacy to be a part of this patient's care.  Laqueta Jean PharmD Candidate 05/29/2023 8:28 PM

## 2023-05-29 NOTE — ED Notes (Signed)
ED TO INPATIENT HANDOFF REPORT  ED Nurse Name and Phone #: Delfin Edis [phone]  S Name/Age/Gender Eugene Diaz 57 y.o. male Room/Bed: H014C/H014C  Code Status   Code Status: Full Code  Home/SNF/Other Home Patient oriented to: self, place, time, and situation Is this baseline? Yes   Triage Complete: Triage complete  Chief Complaint Osteomyelitis of right foot [facility]) [M86.9]  Triage Note Pt arrives with c/o right foot pain and wound. [facility] and they recommended for pt evaluation for a right BKA. Pt denies fevers. Pt endorses drainage. Pt has previous left BKA.    Allergies No Known Allergies  Level of Care/Admitting Diagnosis ED Disposition     ED Disposition  Admit   Condition  --   Comment  Hospital Area: [facility] [[id number]]  Level of Care: Med-Surg [16]  May admit patient to [facility] or [facility] if equivalent level of care is available:: No  Covid Evaluation: Asymptomatic - no recent exposure (last 10 days) testing not required  Diagnosis: Osteomyelitis of right foot [facility]) [[id number]]  Admitting Physician: Briscoe Deutscher [[id number]]  Attending Physician: Briscoe Deutscher [[id number]]  Certification:: I certify this patient will need inpatient services for at least 2 midnights  Estimated Length of Stay: 3          B Medical/Surgery History 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) 05/23/2022   Depression    Diabetic neuropathy (HCC)    Edema of both lower extremities    Essential tremor    neurologist-- dr patel--- due to alcohol abuse   GERD (gastroesophageal reflux disease)    History of cellulitis 2020   left lower leg;   recurrent 03-25-2020   History of esophageal stricture    s/p   dilatation 02-13-2018   History of osteomyelitis 2020   left great toe     Hypercholesterolemia    Hypertension    followed by pcp  (nuclear stress test 03-11-2014 low risk w/ no ischemia, ef 65%   Normocytic anemia    followed by pcp   (03-18-2020 had transfusion's 02/ 2021)   Osteomyelitis of great toe of right foot (HCC) 08/18/2021   Peripheral vascular disease (HCC)    Severe depression (HCC) 06/09/2021   Status post incision and drainage followed by Dr Samuella Cota and pcp   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   (03-17-2020  pt states most of incision is healed with exception an area that is still draining,  daily dressing change),  foot is red but not warm to the touch and has swelling but has improved)   Suicidal ideation 06/09/2021   Syncope and collapse 06/07/2022   Syncope, near 06/20/2022   Thrombocytopenia (HCC)    chronic   Type 2 diabetes mellitus (HCC)    followed by pcp---    (03-18-2020  pt stated does not check blood sugar)   Vaccine counseling 08/29/2022   Past Surgical History:  Procedure Laterality Date   AMPUTATION Left 10/16/2019   Procedure: Left partial second ray resection; placement of antibiotic beads;  Surgeon: Park Liter, DPM;  [facility]: [facility];  Service: Podiatry;  Laterality: Left;   AMPUTATION Left 11/13/2019  Procedure: Left Midfoot Amputation - Transmetatarsal vs. Lisfranc; Placement antibiotic beads;  Surgeon: Park Liter, DPM;  [facility]: [facility];  Service: Podiatry;  Laterality: Left;   AMPUTATION Left 12/21/2019   Procedure: Chopart Amputation left foot;  Surgeon: Park Liter, DPM;  [facility];  Service: Podiatry;  Laterality: Left;   AMPUTATION Left 06/24/2020   Procedure: LEFT BELOW KNEE AMPUTATION;  Surgeon: Nadara Mustard, MD;  Location: [facility];  Service: Orthopedics;  Laterality: Left;   AMPUTATION Right 03/29/2021   Procedure: AMPUTATION RAY;  Surgeon: Park Liter, DPM;  [facility];  Service: Podiatry;  Laterality:  Right;   AMPUTATION Right 04/28/2023   Procedure: RIGHT PARTIAL FOOT AMPUTATION;  Surgeon: Felecia Shelling, DPM;  [facility];  Service: Podiatry;  Laterality: Right;   AMPUTATION TOE Left 08/14/2019   Procedure: AMPUTATION GREAT TOE;  Surgeon: Park Liter, DPM;  [facility]: [facility];  Service: Podiatry;  Laterality: Left;   APPLICATION OF WOUND VAC Left 12/21/2019   Procedure: Application Of Wound Vac;  Surgeon: Park Liter, DPM;  [facility];  Service: Podiatry;  Laterality: Left;   BIOPSY  02/13/2018   Procedure: BIOPSY;  Surgeon: West Bali, MD;  [facility];  Service: Endoscopy;;  transverse colon biopsy, gastric biopsy   BONE BIOPSY Right 04/28/2023   Procedure: BONE BIOPSY;  Surgeon: Felecia Shelling, DPM;  [facility];  Service: Podiatry;  Laterality: Right;   COLONOSCOPY WITH PROPOFOL N/A 02/13/2018   Procedure: COLONOSCOPY WITH PROPOFOL;  Surgeon: West Bali, MD;  [facility];  Service: Endoscopy;  Laterality: N/A;  11:15am   ESOPHAGOGASTRODUODENOSCOPY (EGD) WITH PROPOFOL N/A 02/13/2018   Procedure: ESOPHAGOGASTRODUODENOSCOPY (EGD) WITH PROPOFOL;  Surgeon: West Bali, MD;  [facility];  Service: Endoscopy;  Laterality: N/A;   I & D EXTREMITY Left 07/16/2019   Procedure: Insicion  AND DEBRIDEMENT EXTREMITY;  Surgeon: Park Liter, DPM;  [facility];  Service: Podiatry;  Laterality: Left;   I & D EXTREMITY Left 06/22/2020   Procedure: IRRIGATION AND DEBRIDEMENT EXTREMITY, left foot and ankle;  Surgeon: Park Liter, DPM;  [facility];  Service: Podiatry;  Laterality: Left;   I & D EXTREMITY Right 03/29/2021   Procedure: IRRIGATION AND DEBRIDEMENT EXTREMITY;  Surgeon: Park Liter, DPM;  [facility];  Service: Podiatry;  Laterality: Right;   INCISION AND DRAINAGE OF WOUND Left 10/13/2019   Procedure: IRRIGATION AND DEBRIDEMENT WOUND OF LEFT FOOT AND FIRST METATARSAL RESECTION;  Surgeon: Park Liter,  DPM;  [facility];  Service: Podiatry;  Laterality: Left;   IRRIGATION AND DEBRIDEMENT FOOT Left 11/11/2019   Procedure: Left Foot Wound Irrigation and Debridement;  Surgeon: Park Liter, DPM;  [facility];  Service: Podiatry;  Laterality: Left;   Left arm     Left arm repair (tendon and artery)   PERCUTANEOUS PINNING  07/16/2019   Procedure: Open Reduction Percutaneous Pinning Extremity;  Surgeon: Park Liter, DPM;  [facility];  Service: Podiatry;;   PILONIDAL CYST EXCISION N/A 08/08/2014   Procedure: CYST EXCISION PILONIDAL EXTENSIVE;  Surgeon: Dalia Heading, MD;  [facility]: [facility];  Service: General;  Laterality: N/A;   POLYPECTOMY  02/13/2018   Procedure: POLYPECTOMY;  Surgeon: West Bali, MD;  [facility];  Service: Endoscopy;;  transverse colon polyp hs, rectal polyps times 2   SAVORY DILATION N/A 02/13/2018   Procedure:  SAVORY DILATION;  Surgeon: West Bali, MD;  [facility];  Service: Endoscopy;  Laterality: N/A;   TENDON LENGTHENING Right 12/04/2021   Procedure: TENDON LENGTHENING;  Surgeon: Asencion Islam, DPM;  [facility];  Service: Podiatry;  Laterality: Right;   TRANSMETATARSAL AMPUTATION Right 12/04/2021   Procedure: TRANSMETATARSAL AMPUTATION;  Surgeon: Asencion Islam, DPM;  [facility];  Service: Podiatry;  Laterality: Right;   WOUND DEBRIDEMENT Left 09/04/2019   Procedure: DEBRIDEMENT WOUND WITH COMPLEX  REPAIR OF DEHISCENCE;  Surgeon: Park Liter, DPM;  Location: [facility];  Service: Podiatry;  Laterality: Left;  Leave patient on stretcher   WOUND DEBRIDEMENT Left 10/16/2019   Procedure: Left foot wound debridement and closure;  Surgeon: Park Liter, DPM;  [facility];  Service: Podiatry;  Laterality: Left;   WOUND DEBRIDEMENT Left 12/18/2019   Procedure: LEFT FOOT DEBRIDEMENT WITH PARTIAL INCISION OF INFECTED BONE;  Surgeon: Park Liter, DPM;  [facility];  Service: Podiatry;   Laterality: Left;  LEFT FOOT DEBRIDEMENT WITH PARTIAL INCISION OF INFECTED BONE   WOUND DEBRIDEMENT Right 04/01/2021   Procedure: Right foot wound debridement and irrigation and closure, bone biopsy;  Surgeon: Park Liter, DPM;  [facility];  Service: Podiatry;  Laterality: Right;     A IV Location/Drains/Wounds Patient Lines/Drains/Airways Status     Active Line/Drains/Airways     Name Placement date Placement time Site Days   Peripheral IV 05/29/23 20 G Right;Posterior Forearm 05/29/23  2020  Forearm  less than 1   Wound / Incision (Open or Dehisced) 05/20/22 Non-pressure wound Foot Anterior;Right Open area to right foot amputation measuring 2.5cm X 2cm X.8cm 05/20/22  2020  Foot  374            Intake/Output Last 24 hours No intake or output data in the 24 hours ending 05/29/23 2108  Labs/Imaging Results for orders placed or performed during the hospital encounter of 05/29/23 (from the past 48 hour(s))  CBC with Differential     Status: Abnormal   Collection Time: 05/29/23  4:01 PM  Result Value Ref Range   WBC 10.2 4.0 - 10.5 K/uL   RBC 4.73 4.22 - 5.81 MIL/uL   Hemoglobin 14.8 13.0 - 17.0 g/dL   HCT 16.1 09.6 - 04.5 %   MCV 93.7 80.0 - 100.0 fL   MCH 31.3 26.0 - 34.0 pg   MCHC 33.4 30.0 - 36.0 g/dL   RDW 40.9 81.1 - 91.4 %   Platelets 434 (H) 150 - 400 K/uL   nRBC 0.0 0.0 - 0.2 %   Neutrophils Relative % 65 %   Neutro Abs 6.6 1.7 - 7.7 K/uL   Lymphocytes Relative 25 %   Lymphs Abs 2.5 0.7 - 4.0 K/uL   Monocytes Relative 4 %   Monocytes Absolute 0.4 0.1 - 1.0 K/uL   Eosinophils Relative 4 %   Eosinophils Absolute 0.5 0.0 - 0.5 K/uL   Basophils Relative 1 %   Basophils Absolute 0.1 0.0 - 0.1 K/uL   Immature Granulocytes 1 %   Abs Immature Granulocytes 0.08 (H) 0.00 - 0.07 K/uL    Comment: Performed at [facility], [address], [place], [place] [address]  Basic metabolic panel     Status: Abnormal   Collection Time: 05/29/23  4:01 PM  Result  Value Ref Range   Sodium 133 (L) 135 - 145 mmol/L   Potassium 3.8 3.5 - 5.1 mmol/L  Chloride 99 98 - 111 mmol/L   CO2 22 22 - 32 mmol/L   Glucose, Bld 166 (H) 70 - 99 mg/dL    Comment: Glucose reference range applies only to samples taken after fasting for at least 8 hours.   BUN 9 6 - 20 mg/dL   Creatinine, Ser 1.61 0.61 - 1.24 mg/dL   Calcium 8.7 (L) 8.9 - 10.3 mg/dL   GFR, Estimated >09 >60 mL/min    Comment: (NOTE) Calculated using the CKD-EPI Creatinine Equation (2021)    Anion gap 12 5 - 15    Comment: Performed at [facility], [address], [place], [place] [address]  I-Stat Lactic Acid     Status: None   Collection Time: 05/29/23  4:25 PM  Result Value Ref Range   Lactic Acid, Venous 1.5 0.5 - 1.9 mmol/L  I-Stat Lactic Acid     Status: None   Collection Time: 05/29/23  7:54 PM  Result Value Ref Range   Lactic Acid, Venous 1.5 0.5 - 1.9 mmol/L  CBG monitoring, ED     Status: Abnormal   Collection Time: 05/29/23  8:23 PM  Result Value Ref Range   Glucose-Capillary 122 (H) 70 - 99 mg/dL    Comment: Glucose reference range applies only to samples taken after fasting for at least 8 hours.   DG Tibia/Fibula Right  Result Date: 05/29/2023 CLINICAL DATA:  Right hindfoot amputation, drainage from surgical wound EXAM: RIGHT TIBIA AND FIBULA - 2 VIEW COMPARISON:  04/28/2023 FINDINGS: Frontal and lateral views of the right tibia and fibula are obtained. Postsurgical changes are seen from amputation at the level of the right hindfoot. Resolution of subcutaneous gas at the surgical site. There is increased soft tissue swelling at the amputation margins. Skin staples are again identified. Cortical irregularity along the distal margin of the calcaneus has developed since prior exam, and developing osteomyelitis is a concern. The right tibia and fibula are unremarkable. The knee and ankle are in anatomic alignment. There is diffuse subcutaneous edema throughout the right lower leg.  IMPRESSION: 1. Findings concerning for developing osteomyelitis of the distal margin of the calcaneus along the prior hindfoot amputation site. 2. Diffuse soft tissue swelling throughout the right lower leg and foot. Electronically Signed   By: Sharlet Salina M.D.   On: 05/29/2023 17:56    Pending Labs Unresulted Labs (From admission, onward)     Start     Ordered   05/30/23 0500  HIV Antibody (routine testing w rflx)  (HIV Antibody (Routine testing w reflex) panel)  Tomorrow morning,   R        05/29/23 1953   05/30/23 0500  Basic metabolic panel  Daily,   R      05/29/23 1953   05/30/23 0500  CBC  Daily,   R      05/29/23 1953   05/29/23 1943  Culture, blood (Routine X 2) w Reflex to ID Panel  BLOOD CULTURE X 2,   R      05/29/23 1942            Vitals/Pain Today's Vitals   05/29/23 1551 05/29/23 1556 05/29/23 1938  BP: (!) 146/82  133/66  Pulse: (!) 110  (!) 102  Resp: 16  16  Temp: 98.8 F (37.1 C)  (!) 100.7 F (38.2 C)  TempSrc: Oral  Oral  SpO2: 98%  94%  Weight:  108.9 kg   PainSc:  3  6  Isolation Precautions No active isolations  Medications Medications  morphine (MS CONTIN) 12 hr tablet 30 mg (has no administration in time range)  oxyCODONE (Oxy IR/ROXICODONE) immediate release tablet 30 mg (has no administration in time range)  amLODipine (NORVASC) tablet 10 mg (has no administration in time range)  rosuvastatin (CRESTOR) tablet 20 mg (has no administration in time range)  ALPRAZolam (XANAX) tablet 1 mg (has no administration in time range)  amitriptyline (ELAVIL) tablet 100 mg (has no administration in time range)  insulin aspart (novoLOG) injection 0-6 Units ( Subcutaneous Not Given 05/29/23 2023)  cefTRIAXone (ROCEPHIN) 2 g in sodium chloride 0.9 % 100 mL IVPB (0 g Intravenous Stopped 05/29/23 2107)  metroNIDAZOLE (FLAGYL) tablet 500 mg (500 mg Oral Given 05/29/23 2019)  acetaminophen (TYLENOL) tablet 650 mg (has no administration in time range)    Or   acetaminophen (TYLENOL) suppository 650 mg (has no administration in time range)  senna-docusate (Senokot-S) tablet 1 tablet (has no administration in time range)  ondansetron (ZOFRAN) tablet 4 mg (has no administration in time range)    Or  ondansetron (ZOFRAN) injection 4 mg (has no administration in time range)  HYDROmorphone (DILAUDID) injection 1 mg (has no administration in time range)  vancomycin (VANCOCIN) 2,500 mg in sodium chloride 0.9 % 500 mL IVPB (has no administration in time range)  vancomycin (VANCOREADY) IVPB 1250 mg/250 mL (has no administration in time range)    Mobility walks with device     Focused Assessments Skin Assessment    R Recommendations: See Admitting Provider Note  Report given to:   Additional Notes:

## 2023-05-29 NOTE — ED Provider Triage Note (Signed)
Emergency Medicine Provider Triage Evaluation Note  Eugene Diaz , a 57 y.o. male  was evaluated in triage.  Pt complains of right lower leg wound.  Patient has history of below the knee amputations and states that he sees [facility].  Patient went to go see his podiatrist today as he had a partial amputation done on his right lower leg which his foot was removed however the wound is draining and not healing right.  Patient states he is on antibiotics and denies any fevers but states that he is having purulent drainage.  Patient went to his podiatrist and was referred to ED for further workup.  Review of Systems  Positive: See HPI Negative: See HPI  Physical Exam  BP (!) 146/82 (BP Location: Right Arm)   Pulse (!) 110   Temp 98.8 F (37.1 C) (Oral)   Resp 16   Wt 108.9 kg   SpO2 98%   BMI 30.81 kg/m  Gen:   Awake, no distress   Resp:  Normal effort  MSK:   Moves extremities without difficulty  Other:  Right lower leg wound appears to be soaking the [place] with a purulent substance, malodorous smell  Medical Decision Making  Medically screening exam initiated at 3:59 PM.  Appropriate orders placed.  Eugene Diaz was informed that the remainder of the evaluation will be completed by another provider, this initial triage assessment does not replace that evaluation, and the importance of remaining in the ED until their evaluation is complete.  Workup initiated, patient stable at this time   Eugene Diaz 05/29/23 1600

## 2023-05-29 NOTE — ED Provider Notes (Signed)
[place] EMERGENCY DEPARTMENT AT [facility] Provider Note   CSN: [id number] Arrival date & time: 05/29/23  1529     History  Chief Complaint  Patient presents with   Foot Wound    Eugene Diaz is a 57 y.o. male.  57 year old male presents with worsening pain and drainage from his prior right lower extremity surgical site.  He notes subjective fever and chills.  He is currently being followed by podiatrist.  He saw that provider today and was noted to have increased purulent drainage and concern for possible osteomyelitis.  Was sent to the ER for further evaluation.  Has been seen by Dr. Lajoyce Corners in the past and has had an amputation on the left lower extremity.       Home Medications Prior to Admission medications   Medication Sig Start Date End Date Taking? Authorizing Provider  acetaminophen (TYLENOL) 325 MG tablet Take 2 tablets (650 mg total) by mouth every 6 (six) hours as needed for mild pain. 03/03/22   Hongalgi, Maximino Greenland, MD  albuterol (VENTOLIN HFA) 108 (90 Base) MCG/ACT inhaler Inhale 1-2 puffs into the lungs every 4 (four) hours as needed for wheezing or shortness of breath. 05/11/22   [provider]  ALPRAZolam Prudy Feeler) 1 MG tablet Take 1 mg by mouth 4 (four) times daily. 05/14/22   [provider]  amitriptyline (ELAVIL) 100 MG tablet Take 100 mg by mouth at bedtime. 09/15/22   [provider]  amLODipine (NORVASC) 10 MG tablet Take 1 tablet (10 mg total) by mouth daily. 02/15/23   Alver Sorrow, NP  amoxicillin-clavulanate (AUGMENTIN) 875-125 MG tablet Take 1 tablet by mouth every 12 (twelve) hours. 05/08/23   Felecia Shelling, DPM  diclofenac Sodium (VOLTAREN) 1 % GEL Apply 4 g topically 4 (four) times daily as needed (Apply to painful area over left upper front chest.). 03/03/22   Hongalgi, Maximino Greenland, MD  doxycycline (VIBRA-TABS) 100 MG tablet Take 1 tablet (100 mg total) by mouth every 12 (twelve) hours. 05/01/23   Joseph Art, DO   folic acid (FOLVITE) 1 MG tablet Take 1 tablet (1 mg total) by mouth daily. 03/03/22   Hongalgi, Maximino Greenland, MD  metFORMIN (GLUCOPHAGE) 500 MG tablet Take 500 mg by mouth 2 (two) times daily. 05/11/22   [provider]  morphine (MS CONTIN) 30 MG 12 hr tablet Take 30 mg by mouth every 12 (twelve) hours. 05/14/22   [provider]  Multiple Vitamin (MULTIVITAMIN WITH MINERALS) TABS tablet Take 1 tablet by mouth daily. 10/14/22   Glade Lloyd, MD  oxycodone (ROXICODONE) 30 MG immediate release tablet Take 30 mg by mouth 3 (three) times daily. 05/14/22   [provider]  rosuvastatin (CRESTOR) 20 MG tablet Take 1 tablet (20 mg total) by mouth daily. 03/20/23   Chilton Si, MD  senna-docusate (SENOKOT-S) 8.6-50 MG tablet Take 1 tablet by mouth at bedtime as needed for mild constipation. 05/01/23   Joseph Art, DO  thiamine 100 MG tablet Take 1 tablet (100 mg total) by mouth daily. 03/04/22   Hongalgi, Maximino Greenland, MD      Allergies    Patient has no known allergies.    Review of Systems   Review of Systems  All other systems reviewed and are negative.   Physical Exam Updated Vital Signs BP (!) 146/82 (BP Location: Right Arm)   Pulse (!) 110   Temp 98.8 F (37.1 C) (Oral)   Resp 16  Wt 108.9 kg   SpO2 98%   BMI 30.81 kg/m  Physical Exam Vitals and nursing note reviewed.  Constitutional:      General: He is not in acute distress.    Appearance: Normal appearance. He is well-developed. He is not toxic-appearing.  HENT:     Head: Normocephalic and atraumatic.  Eyes:     General: Lids are normal.     Conjunctiva/sclera: Conjunctivae normal.     Pupils: Pupils are equal, round, and reactive to light.  Neck:     Thyroid: No thyroid mass.     Trachea: No tracheal deviation.  Cardiovascular:     Rate and Rhythm: Normal rate and regular rhythm.     Heart sounds: Normal heart sounds. No murmur heard.    No gallop.  Pulmonary:     Effort: Pulmonary effort is  normal. No respiratory distress.     Breath sounds: Normal breath sounds. No stridor. No decreased breath sounds, wheezing, rhonchi or rales.  Abdominal:     General: There is no distension.     Palpations: Abdomen is soft.     Tenderness: There is no abdominal tenderness. There is no rebound.  Musculoskeletal:        General: No tenderness. Normal range of motion.     Cervical back: Normal range of motion and neck supple.       Legs:  Skin:    General: Skin is warm and dry.     Findings: No abrasion or rash.  Neurological:     Mental Status: He is alert and oriented to person, place, and time. Mental status is at baseline.     GCS: GCS eye subscore is 4. GCS verbal subscore is 5. GCS motor subscore is 6.     Cranial Nerves: Cranial nerves are intact. No cranial nerve deficit.     Sensory: No sensory deficit.     Motor: Motor function is intact.  Psychiatric:        Attention and Perception: Attention normal.        Speech: Speech normal.        Behavior: Behavior normal.     ED Results / Procedures / Treatments   Labs (all labs ordered are listed, but only abnormal results are displayed) Labs Reviewed  CBC WITH DIFFERENTIAL/PLATELET - Abnormal; Notable for the following components:      Result Value   Platelets 434 (*)    Abs Immature Granulocytes 0.08 (*)    All other components within normal limits  BASIC METABOLIC PANEL - Abnormal; Notable for the following components:   Sodium 133 (*)    Glucose, Bld 166 (*)    Calcium 8.7 (*)    All other components within normal limits  I-STAT CG4 LACTIC ACID, ED  I-STAT CG4 LACTIC ACID, ED    EKG None  Radiology DG Tibia/Fibula Right  Result Date: 05/29/2023 CLINICAL DATA:  Right hindfoot amputation, drainage from surgical wound EXAM: RIGHT TIBIA AND FIBULA - 2 VIEW COMPARISON:  04/28/2023 FINDINGS: Frontal and lateral views of the right tibia and fibula are obtained. Postsurgical changes are seen from amputation at the  level of the right hindfoot. Resolution of subcutaneous gas at the surgical site. There is increased soft tissue swelling at the amputation margins. Skin staples are again identified. Cortical irregularity along the distal margin of the calcaneus has developed since prior exam, and developing osteomyelitis is a concern. The right tibia and fibula are unremarkable. The knee and ankle are  in anatomic alignment. There is diffuse subcutaneous edema throughout the right lower leg. IMPRESSION: 1. Findings concerning for developing osteomyelitis of the distal margin of the calcaneus along the prior hindfoot amputation site. 2. Diffuse soft tissue swelling throughout the right lower leg and foot. Electronically Signed   By: Sharlet Salina M.D.   On: 05/29/2023 17:56    Procedures Procedures    Medications Ordered in ED Medications  piperacillin-tazobactam (ZOSYN) IVPB 3.375 g (has no administration in time range)    ED Course/ Medical Decision Making/ A&P                             Medical Decision Making Risk Prescription drug management.   Patient's x-ray of his right lower extremity consistent with osteomyelitis.  Started on IV Zosyn.  Will consult hospitalist for admission.  Evidence of sepsis at this time        Final Clinical Impression(s) / ED Diagnoses Final diagnoses:  None    Rx / DC Orders ED Discharge Orders     None         Lorre Nick, MD 05/29/23 [phone]

## 2023-05-29 NOTE — H&P (Signed)
History and Physical    Eugene Diaz VWU:[id number] DOB: Jul 19, 1966 DOA: 05/29/2023  PCP: Elfredia Nevins, MD   Patient coming from: Home   Chief Complaint: Right foot surgical site pain and drainage   HPI: Eugene Diaz is a 57 y.o. male with medical history significant for hypertension, type 2 diabetes mellitus, CAD, depression, anxiety, left BKA in 2021, and right foot osteomyelitis status post TMA on 04/28/2023 who presents with worsening pain and drainage from the right foot surgical site.  Patient was seen by ID last month after his partial right foot amputation and was discharged with 1 week of doxycycline and Augmentin.  He was seen by podiatry in follow-up on 05/08/2023 and his course of Augmentin was extended.  Despite this, he has developed dehiscence of the surgical wound with purulent drainage and foul odor.  When he saw podiatry in the clinic on 05/24/2023, he was advised to present to the emergency department for likely admission and BKA.  He has gone on to develop fevers and chills at home, prompting his presentation today.  ED Course: Upon arrival to the ED, patient is found to be febrile to 38.2 with mild tachycardia and stable blood pressure.  Labs are most notable for normal WBC and normal lactic acid.  Plain radiographs of the right lower extremity demonstrate diffuse soft tissue swelling throughout the lower leg and foot, as well as findings concerning for osteomyelitis involving the calcaneus.  Review of Systems:  All other systems reviewed and apart from HPI, are negative.  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) 05/23/2022   Depression    Diabetic neuropathy (HCC)    Edema of both lower extremities    Essential tremor    neurologist-- dr patel---  due to alcohol abuse   GERD (gastroesophageal reflux disease)    History of cellulitis 2020   left lower leg;   recurrent 03-25-2020   History of esophageal stricture    s/p  dilatation 02-13-2018   History of osteomyelitis 2020   left great toe     Hypercholesterolemia    Hypertension    followed by pcp  (nuclear stress test 03-11-2014 low risk w/ no ischemia, ef 65%   Normocytic anemia    followed by pcp   (03-18-2020 had transfusion's 02/ 2021)   Osteomyelitis of great toe of right foot (HCC) 08/18/2021   Peripheral vascular disease (HCC)    Severe depression (HCC) 06/09/2021   Status post incision and drainage followed by Dr Samuella Cota and pcp   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   (03-17-2020  pt states most of incision is healed with exception an area that is still draining,  daily dressing change),  foot is red but not warm to the touch and has swelling but has improved)   Suicidal ideation 06/09/2021   Syncope and collapse 06/07/2022   Syncope, near 06/20/2022   Thrombocytopenia (HCC)    chronic   Type 2 diabetes mellitus (HCC)    followed by pcp---    (03-18-2020  pt stated does not check blood sugar)   Vaccine counseling 08/29/2022    Past Surgical History:  Procedure Laterality Date   AMPUTATION Left 10/16/2019   Procedure:  Left partial second ray resection; placement of antibiotic beads;  Surgeon: Park Liter, DPM;  Location: [facility];  Service: Podiatry;  Laterality: Left;   AMPUTATION Left 11/13/2019   Procedure: Left Midfoot Amputation - Transmetatarsal vs. Lisfranc; Placement antibiotic beads;  Surgeon: Park Liter, DPM;  Location: [facility];  Service: Podiatry;  Laterality: Left;   AMPUTATION Left 12/21/2019   Procedure: Chopart Amputation left foot;  Surgeon: Park Liter, DPM;  Location: [facility];  Service: Podiatry;  Laterality: Left;   AMPUTATION Left 06/24/2020   Procedure: LEFT BELOW KNEE AMPUTATION;  Surgeon: Nadara Mustard, MD;  Location: [facility];   Service: Orthopedics;  Laterality: Left;   AMPUTATION Right 03/29/2021   Procedure: AMPUTATION RAY;  Surgeon: Park Liter, DPM;  Location: [facility];  Service: Podiatry;  Laterality: Right;   AMPUTATION Right 04/28/2023   Procedure: RIGHT PARTIAL FOOT AMPUTATION;  Surgeon: Felecia Shelling, DPM;  Location: [facility];  Service: Podiatry;  Laterality: Right;   AMPUTATION TOE Left 08/14/2019   Procedure: AMPUTATION GREAT TOE;  Surgeon: Park Liter, DPM;  Location: [facility];  Service: Podiatry;  Laterality: Left;   APPLICATION OF WOUND VAC Left 12/21/2019   Procedure: Application Of Wound Vac;  Surgeon: Park Liter, DPM;  [facility]: [facility];  Service: Podiatry;  Laterality: Left;   BIOPSY  02/13/2018   Procedure: BIOPSY;  Surgeon: West Bali, MD;  [facility]: [facility];  Service: Endoscopy;;  transverse colon biopsy, gastric biopsy   BONE BIOPSY Right 04/28/2023   Procedure: BONE BIOPSY;  Surgeon: Felecia Shelling, DPM;  Location: [facility];  Service: Podiatry;  Laterality: Right;   COLONOSCOPY WITH PROPOFOL N/A 02/13/2018   Procedure: COLONOSCOPY WITH PROPOFOL;  Surgeon: West Bali, MD;  [facility]: [facility];  Service: Endoscopy;  Laterality: N/A;  11:15am   ESOPHAGOGASTRODUODENOSCOPY (EGD) WITH PROPOFOL N/A 02/13/2018   Procedure: ESOPHAGOGASTRODUODENOSCOPY (EGD) WITH PROPOFOL;  Surgeon: West Bali, MD;  [facility];  Service: Endoscopy;  Laterality: N/A;   I & D EXTREMITY Left 07/16/2019   Procedure: Insicion  AND DEBRIDEMENT EXTREMITY;  Surgeon: Park Liter, DPM;  [facility];  Service: Podiatry;  Laterality: Left;   I & D EXTREMITY Left 06/22/2020   Procedure: IRRIGATION AND DEBRIDEMENT EXTREMITY, left foot and ankle;  Surgeon: Park Liter, DPM;  [facility];  Service: Podiatry;  Laterality: Left;   I & D EXTREMITY Right 03/29/2021   Procedure: IRRIGATION AND DEBRIDEMENT EXTREMITY;  Surgeon: Park Liter, DPM;  [facility];  Service: Podiatry;   Laterality: Right;   INCISION AND DRAINAGE OF WOUND Left 10/13/2019   Procedure: IRRIGATION AND DEBRIDEMENT WOUND OF LEFT FOOT AND FIRST METATARSAL RESECTION;  Surgeon: Park Liter, DPM;  Location: [facility];  Service: Podiatry;  Laterality: Left;   IRRIGATION AND DEBRIDEMENT FOOT Left 11/11/2019   Procedure: Left Foot Wound Irrigation and Debridement;  Surgeon: Park Liter, DPM;  Location: [facility];  Service: Podiatry;  Laterality: Left;   Left arm     Left arm repair (tendon and artery)   PERCUTANEOUS PINNING  07/16/2019   Procedure: Open Reduction Percutaneous Pinning Extremity;  Surgeon: Park Liter, DPM;  [facility];  Service: Podiatry;;   PILONIDAL CYST EXCISION N/A 08/08/2014   Procedure: CYST EXCISION PILONIDAL EXTENSIVE;  Surgeon: Dalia Heading, MD;  Location: [facility];  Service: General;  Laterality: N/A;   POLYPECTOMY  02/13/2018   Procedure: POLYPECTOMY;  Surgeon: West Bali, MD;  [facility];  Service: Endoscopy;;  transverse colon polyp hs, rectal polyps times 2   SAVORY DILATION N/A 02/13/2018   Procedure: SAVORY DILATION;  Surgeon: West Bali, MD;  [facility];  Service: Endoscopy;  Laterality: N/A;   TENDON LENGTHENING Right 12/04/2021   Procedure: TENDON LENGTHENING;  Surgeon: Asencion Islam, DPM;  [facility];  Service: Podiatry;  Laterality: Right;   TRANSMETATARSAL AMPUTATION Right 12/04/2021   Procedure: TRANSMETATARSAL AMPUTATION;  Surgeon: Asencion Islam, DPM;  [facility];  Service: Podiatry;  Laterality: Right;   WOUND DEBRIDEMENT Left 09/04/2019   Procedure: DEBRIDEMENT WOUND WITH COMPLEX  REPAIR OF DEHISCENCE;  Surgeon: Park Liter, DPM;  Location: [facility];  Service: Podiatry;  Laterality: Left;  Leave patient on stretcher   WOUND DEBRIDEMENT Left 10/16/2019   Procedure: Left foot wound debridement and closure;  Surgeon: Park Liter, DPM;  Location: [facility];  Service: Podiatry;  Laterality:  Left;   WOUND DEBRIDEMENT Left 12/18/2019   Procedure: LEFT FOOT DEBRIDEMENT WITH PARTIAL INCISION OF INFECTED BONE;  Surgeon: Park Liter, DPM;  [facility]: [facility];  Service: Podiatry;  Laterality: Left;  LEFT FOOT DEBRIDEMENT WITH PARTIAL INCISION OF INFECTED BONE   WOUND DEBRIDEMENT Right 04/01/2021   Procedure: Right foot wound debridement and irrigation and closure, bone biopsy;  Surgeon: Park Liter, DPM;  [facility]: [facility];  Service: Podiatry;  Laterality: Right;    Social History:   reports that he has been smoking cigars. He has never used smokeless tobacco. He reports current alcohol use of about 50.0 standard drinks of alcohol per week. He reports that he does not currently use drugs after having used the following drugs: Marijuana.  No Known Allergies  Family History  Problem Relation Age of Onset   Heart attack Mother        Living, 4   Cancer Father        Deceased   Healthy Brother    Healthy Sister    Colon cancer Neg Hx    Colon polyps Neg Hx      Prior to Admission medications   Medication Sig Start Date End Date Taking? Authorizing Provider  acetaminophen (TYLENOL) 325 MG tablet Take 2 tablets (650 mg total) by mouth every 6 (six) hours as needed for mild pain. 03/03/22   Hongalgi, Maximino Greenland, MD  albuterol (VENTOLIN HFA) 108 (90 Base) MCG/ACT inhaler Inhale 1-2 puffs into the lungs every 4 (four) hours as needed for wheezing or shortness of breath. 05/11/22   [provider]  ALPRAZolam Prudy Feeler) 1 MG tablet Take 1 mg by mouth 4 (four) times daily. 05/14/22   [provider]  amitriptyline (ELAVIL) 100 MG tablet Take 100 mg by mouth at bedtime. 09/15/22   [provider]  amLODipine (NORVASC) 10 MG tablet Take 1 tablet (10 mg total) by mouth daily. 02/15/23   Alver Sorrow, NP  amoxicillin-clavulanate (AUGMENTIN) 875-125 MG tablet Take 1 tablet by mouth every 12 (twelve) hours. 05/08/23   Felecia Shelling, DPM  diclofenac Sodium (VOLTAREN) 1 %  GEL Apply 4 g topically 4 (four) times daily as needed (Apply to painful area over left upper front chest.). 03/03/22   Hongalgi, Maximino Greenland, MD  doxycycline (VIBRA-TABS) 100 MG tablet Take 1 tablet (100 mg total) by mouth every 12 (twelve) hours. 05/01/23   Joseph Art, DO  folic acid (FOLVITE) 1 MG tablet Take 1  tablet (1 mg total) by mouth daily. 03/03/22   Hongalgi, Maximino Greenland, MD  metFORMIN (GLUCOPHAGE) 500 MG tablet Take 500 mg by mouth 2 (two) times daily. 05/11/22   [provider]  morphine (MS CONTIN) 30 MG 12 hr tablet Take 30 mg by mouth every 12 (twelve) hours. 05/14/22   [provider]  Multiple Vitamin (MULTIVITAMIN WITH MINERALS) TABS tablet Take 1 tablet by mouth daily. 10/14/22   Glade Lloyd, MD  oxycodone (ROXICODONE) 30 MG immediate release tablet Take 30 mg by mouth 3 (three) times daily. 05/14/22   [provider]  rosuvastatin (CRESTOR) 20 MG tablet Take 1 tablet (20 mg total) by mouth daily. 03/20/23   Chilton Si, MD  senna-docusate (SENOKOT-S) 8.6-50 MG tablet Take 1 tablet by mouth at bedtime as needed for mild constipation. 05/01/23   Joseph Art, DO  thiamine 100 MG tablet Take 1 tablet (100 mg total) by mouth daily. 03/04/22   Elease Etienne, MD    Physical Exam: Vitals:   05/29/23 1551 05/29/23 1556 05/29/23 1938  BP: (!) 146/82  133/66  Pulse: (!) 110  (!) 102  Resp: 16  16  Temp: 98.8 F (37.1 C)  (!) 100.7 F (38.2 C)  TempSrc: Oral  Oral  SpO2: 98%  94%  Weight:  108.9 kg     Constitutional: NAD, calm  Eyes: PERTLA, lids and conjunctivae normal ENMT: Mucous membranes are moist. Posterior pharynx clear of any exudate or lesions.   Neck: supple, no masses  Respiratory: no wheezing, no crackles. No accessory muscle use.  Cardiovascular: S1 & S2 heard, regular rate and rhythm. No JVD. Abdomen: No distension, no tenderness, soft. Bowel sounds active.  Musculoskeletal: no clubbing / cyanosis. S/p left BKA and right TMA.    Skin: Dehiscence of right foot surgical wound with drainage and foul odor. Warm, dry, well-perfused. Neurologic: CN 2-12 grossly intact. Moving all extremities. Alert and oriented.  Psychiatric: Pleasant. Cooperative.    Labs and Imaging on Admission: I have personally reviewed following labs and imaging studies  CBC: Recent Labs  Lab 05/29/23 1601  WBC 10.2  NEUTROABS 6.6  HGB 14.8  HCT 44.3  MCV 93.7  PLT 434*   Basic Metabolic Panel: Recent Labs  Lab 05/29/23 1601  NA 133*  K 3.8  CL 99  CO2 22  GLUCOSE 166*  BUN 9  CREATININE 0.83  CALCIUM 8.7*   GFR: Estimated Creatinine Clearance: 127.5 mL/min (by C-G formula based on SCr of 0.83 mg/dL). Liver Function Tests: No results for input(s): "AST", "ALT", "ALKPHOS", "BILITOT", "PROT", "ALBUMIN" in the last 168 hours. No results for input(s): "LIPASE", "AMYLASE" in the last 168 hours. No results for input(s): "AMMONIA" in the last 168 hours. Coagulation Profile: No results for input(s): "INR", "PROTIME" in the last 168 hours. Cardiac Enzymes: No results for input(s): "CKTOTAL", "CKMB", "CKMBINDEX", "TROPONINI" in the last 168 hours. BNP (last 3 results) No results for input(s): "PROBNP" in the last 8760 hours. HbA1C: No results for input(s): "HGBA1C" in the last 72 hours. CBG: No results for input(s): "GLUCAP" in the last 168 hours. Lipid Profile: No results for input(s): "CHOL", "HDL", "LDLCALC", "TRIG", "CHOLHDL", "LDLDIRECT" in the last 72 hours. Thyroid Function Tests: No results for input(s): "TSH", "T4TOTAL", "FREET4", "T3FREE", "THYROIDAB" in the last 72 hours. Anemia Panel: No results for input(s): "VITAMINB12", "FOLATE", "FERRITIN", "TIBC", "IRON", "RETICCTPCT" in the last 72 hours. Urine analysis:    Component Value Date/Time   COLORURINE STRAW (A) 03/29/2021  1705   APPEARANCEUR CLEAR 03/29/2021 1705   LABSPEC 1.010 03/29/2021 1705   PHURINE 6.0 03/29/2021 1705   GLUCOSEU NEGATIVE 03/29/2021 1705    HGBUR MODERATE (A) 03/29/2021 1705   BILIRUBINUR NEGATIVE 03/29/2021 1705   KETONESUR NEGATIVE 03/29/2021 1705   PROTEINUR NEGATIVE 03/29/2021 1705   UROBILINOGEN 4.0 (H) 10/09/2008 1114   NITRITE NEGATIVE 03/29/2021 1705   LEUKOCYTESUR NEGATIVE 03/29/2021 1705   Sepsis Labs: @LABRCNTIP (procalcitonin:4,lacticidven:4) )No results found for this or any previous visit (from the past 240 hour(s)).   Radiological Exams on Admission: DG Tibia/Fibula Right  Result Date: 05/29/2023 CLINICAL DATA:  Right hindfoot amputation, drainage from surgical wound EXAM: RIGHT TIBIA AND FIBULA - 2 VIEW COMPARISON:  04/28/2023 FINDINGS: Frontal and lateral views of the right tibia and fibula are obtained. Postsurgical changes are seen from amputation at the level of the right hindfoot. Resolution of subcutaneous gas at the surgical site. There is increased soft tissue swelling at the amputation margins. Skin staples are again identified. Cortical irregularity along the distal margin of the calcaneus has developed since prior exam, and developing osteomyelitis is a concern. The right tibia and fibula are unremarkable. The knee and ankle are in anatomic alignment. There is diffuse subcutaneous edema throughout the right lower leg. IMPRESSION: 1. Findings concerning for developing osteomyelitis of the distal margin of the calcaneus along the prior hindfoot amputation site. 2. Diffuse soft tissue swelling throughout the right lower leg and foot. Electronically Signed   By: Sharlet Salina M.D.   On: 05/29/2023 17:56    Assessment and Plan   1. Right foot osteomyelitis  - Culture blood, treat with Rocephin, vancomycin, and Flagyl, consult Dr. Lajoyce Corners in am    2. Type II DM  - A1c was 5.6% in May 2024  - Check CBGs and use low-intensity SSI for now    3. Hypertension  - Continue Norvasc   4. HLD  - Continue Crestor    5. Depression, anxiety  - Continue amitriptyline and Xanax    6. Chronic pain  -  Prescription database reviewed, plan to continue home regimen with long-acting morphine and Oxy IR    DVT prophylaxis: SCD  Code Status: Full  Level of Care: Level of care: Med-Surg Family Communication: None present  Disposition Plan:  Patient is from: Home   Anticipated d/c is to: TBD Anticipated d/c date is: 06/01/23  Patient currently: Pending treatment of infection, orthopedic surgery consultation  Consults called: None  Admission status: Inpatient     Briscoe Deutscher, MD [facility]  05/29/2023, 7:55 PM

## 2023-05-29 NOTE — ED Triage Notes (Signed)
Pt arrives with c/o right foot pain and wound. Pt has been seeing [facility] and they recommended for pt evaluation for a right BKA. Pt denies fevers. Pt endorses drainage. Pt has previous left BKA.

## 2023-05-30 DIAGNOSIS — I2510 Atherosclerotic heart disease of native coronary artery without angina pectoris: Secondary | ICD-10-CM

## 2023-05-30 DIAGNOSIS — E669 Obesity, unspecified: Secondary | ICD-10-CM | POA: Insufficient documentation

## 2023-05-30 DIAGNOSIS — M869 Osteomyelitis, unspecified: Secondary | ICD-10-CM | POA: Diagnosis not present

## 2023-05-30 DIAGNOSIS — F411 Generalized anxiety disorder: Secondary | ICD-10-CM | POA: Diagnosis not present

## 2023-05-30 DIAGNOSIS — M86179 Other acute osteomyelitis, unspecified ankle and foot: Secondary | ICD-10-CM

## 2023-05-30 DIAGNOSIS — M86171 Other acute osteomyelitis, right ankle and foot: Secondary | ICD-10-CM | POA: Diagnosis not present

## 2023-05-30 LAB — GLUCOSE, CAPILLARY
Glucose-Capillary: 135 mg/dL — ABNORMAL HIGH (ref 70–99)
Glucose-Capillary: 92 mg/dL (ref 70–99)
Glucose-Capillary: 96 mg/dL (ref 70–99)
Glucose-Capillary: 137 mg/dL — ABNORMAL HIGH (ref 70–99)
Glucose-Capillary: 196 mg/dL — ABNORMAL HIGH (ref 70–99)
Glucose-Capillary: 167 mg/dL — ABNORMAL HIGH (ref 70–99)

## 2023-05-30 MED ORDER — ADULT MULTIVITAMIN W/MINERALS CH
1.0000 | ORAL_TABLET | Freq: Every day | ORAL | Status: DC
  Administered 2023-05-30 – 2023-06-09 (×10): 1 via ORAL
  Filled 2023-05-30 (×10): qty 1

## 2023-05-30 MED ORDER — ENSURE ENLIVE PO LIQD
237.0000 mL | Freq: Two times a day (BID) | ORAL | Status: DC
  Administered 2023-05-30 – 2023-06-09 (×19): 237 mL via ORAL

## 2023-05-30 MED ORDER — CHLORHEXIDINE GLUCONATE CLOTH 2 % EX PADS
6.0000 | MEDICATED_PAD | Freq: Every day | CUTANEOUS | Status: AC
  Administered 2023-05-30 – 2023-06-03 (×5): 6 via TOPICAL

## 2023-05-30 MED ORDER — JUVEN PO PACK
1.0000 | PACK | Freq: Two times a day (BID) | ORAL | Status: DC
  Administered 2023-05-30 – 2023-06-09 (×13): 1 via ORAL
  Filled 2023-05-30 (×16): qty 1

## 2023-05-30 MED ORDER — MUPIROCIN 2 % EX OINT
1.0000 | TOPICAL_OINTMENT | Freq: Two times a day (BID) | CUTANEOUS | Status: AC
  Administered 2023-05-30 – 2023-06-02 (×9): 1 via NASAL
  Filled 2023-05-30 (×4): qty 22

## 2023-05-30 NOTE — Assessment & Plan Note (Signed)
Presented with tachycardia, fever.  Found to have bacteremia with enterococcus and proteus. - Consult ID - Continue vancomycin, Rocephin and Flagyl - Consult Podiatry and Ortho for amputation

## 2023-05-30 NOTE — Progress Notes (Signed)
Initial Nutrition Assessment  DOCUMENTATION CODES:   Not applicable  INTERVENTION:  Liberalize diet to Carb Modified due to increased needs Multivitamin w/ minerals daily 1 packet Juven BID, each packet provides 95 calories, 2.5 grams of protein (collagen), and 9.8 grams of carbohydrate (3 grams sugar); also contains 7 grams of L-arginine and L-glutamine, 300 mg vitamin C, 15 mg vitamin E, 1.2 mcg vitamin B-12, 9.5 mg zinc, 200 mg calcium, and 1.5 g  Calcium Beta-hydroxy-Beta-methylbutyrate to support wound healing Ensure Enlive po BID, each supplement provides 350 kcal and 20 grams of protein. Double protein with all meals  NUTRITION DIAGNOSIS:   Increased nutrient needs related to wound healing as evidenced by estimated needs.  GOAL:   Patient will meet greater than or equal to 90% of their needs  MONITOR:   PO intake, Supplement acceptance, Labs, Weight trends, Skin  REASON FOR ASSESSMENT:   Consult Wound healing  ASSESSMENT:   57 y.o. male presented to the ED with worsening pain and drainage from R foot surgical site. S/p TMA on 04/28/23. PMH includes T2DM, L BKA, HLD, CAD, HTN, GERD, EtOH abuse, anxiety, and depression. Pt admitted with R foot osteomyelitis.   Met with pt in room. Pt reports that his appetite is good now, eating well. Reports that at home he only eats 1 meal per day, as he lives alone. Denies any nausea or vomiting. Discussed the importance of proper nutrition for wound healing. Pt agreeable to try oral nutrition supplements.   Per weight history, pt weight is up 4.7 kg since January 2024.   Medications reviewed and include: NovoLog SSI, Flagyl, IV antibiotics  Labs reviewed. CBG: 92-205 x 24 hrs   NUTRITION - FOCUSED PHYSICAL EXAM:  Deferred to follow-up due to pt receiving phone call.  Diet Order:   Diet Order             Diet Carb Modified Fluid consistency: Thin; Room service appropriate? Yes  Diet effective now                    EDUCATION NEEDS:   Education needs have been addressed  Skin:  Skin Assessment: Reviewed RN Assessment  Last BM:  Unknown  Height:   Ht Readings from Last 1 Encounters:  05/29/23 6' 2.02" (1.88 m)    Weight:   Wt Readings from Last 1 Encounters:  05/29/23 108.6 kg    Ideal Body Weight:  86.4 kg  BMI:  Body mass index is 30.73 kg/m.  Estimated Nutritional Needs:  Kcal:  2200-2400 Protein:  110-130 grams Fluid:  >/= 2 L   Kirby Crigler RD, LDN Clinical Dietitian See [facility] for contact information.

## 2023-05-30 NOTE — Assessment & Plan Note (Signed)
-  Continue home Xanax ?

## 2023-05-30 NOTE — Assessment & Plan Note (Signed)
-   Continue home morphine

## 2023-05-30 NOTE — Consult Note (Addendum)
I have seen and examined the patient. I have personally reviewed the clinical findings, laboratory findings, microbiological data and imaging studies. The assessment and treatment plan was discussed with the Nurse Practitioner Jeanine Luz. I agree with her/his recommendations except following additions/corrections.  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  Patient was discharged on doxycycline and Augmentin for a week post OR last admission.  He followed up with podiatry on 7/8 and a course of Augmentin was extended however he developed wound dehiscence with purulent drainage.  He was then advised to go to ED on follow-up on 7/24 for possible need for BKA.   Febrile Labs/imaging and microbiologic data reviewed  Continue current abtx Vancomycin, ceftriaxone and meronidazole Repeat 2 sets of blood cultures tomorrow TTE ordered  Podiatry and orthopedics have been consulted. May need Vascular surgery consult as well given h/o PAD and defer to surgical team  Following cultures for de-escalation of abtx    Eugene Fraction, MD Infectious Disease Physician [facility] [address] [place], [place] [address] Phone: [phone]  Fax: [phone]  I have personally spent 80 minutes involved in face-to-face and non-face-to-face activities for this patient on the day of the visit. Professional time spent includes the following activities: Preparing to see the patient (review of tests), Obtaining and/or reviewing separately obtained history (admission/discharge record), Performing a medically appropriate examination and/or evaluation , Ordering  medications/tests/procedures, referring and communicating with other health care professionals, Documenting clinical information in the EMR, Independently interpreting results (not separately reported), Communicating results to the patient/family/caregiver, Counseling and educating the patient/family/caregiver and Care coordination (not separately reported).    [facility]    Date of Admission:  05/29/2023     Total days of antibiotics 2               Reason for Consult: Enterococcus Bacteremia  Referring Provider: [employer] Primary Care Provider: Elfredia Nevins, MD   ASSESSMENT:  Eugene Diaz is a 57 y/o caucasian male with diabetes complicated by right diabetic foot ulcer s/p recent Chopart amputation admitted with increasing drainage of his surgical site and failure of the soft tissue with new osteomyelitis. Blood cultures are positive for Enterococcus faecalis and Proteus species with source of infection being his right amputation site. Awaiting evaluation by Podiatry/Orthopedics with anticipated need for right below knee amputation. Repeat blood cultures to ensure clearance of bacteremia.  Previous diagnosis of periperhal vascular disease and may need Vascular assessment. Counseled on importance of continued blood sugar control and tobacco cessation to reduce risk of further complicated healing and increased risk of infection. Continue current dose of vancomycin and ceftriaxone. Remaining medical and supportive care per Internal Medicine.   PLAN:  Continue current dose of vancomycin and ceftriaxone + Metronodazole Repeat blood cultures for clearance of bacteremia. Await Podiatry/Orthopedic evaluation for surgical intervention.  Remaining medical and supportive care per Internal Medicine.    I have personally spent 28  minutes involved in face-to-face and non-face-to-face activities for this patient on the day of the visit. Professional time spent  includes the following activities: Preparing to see the patient (review of tests), Obtaining and/or reviewing separately obtained history (admission/discharge record), Performing a medically  appropriate examination and/or evaluation , Ordering medications/tests/procedures, referring and communicating with other health care professionals, Documenting clinical information in the EMR, Independently interpreting results (not separately reported), Communicating results to the patient/family/caregiver, Counseling and educating the patient/family/caregiver and Care coordination (not separately reported).   Principal Problem:   Osteomyelitis of right foot (HCC) Active Problems:   Type 2 diabetes mellitus with peripheral neuropathy (HCC)   HLD (hyperlipidemia)   Severe depression (HCC)   Essential hypertension   CAD in native artery   GAD (generalized anxiety disorder)   Chronic pain   Obesity (BMI 30-39.9)    amitriptyline  100 mg Oral QHS   amLODipine  10 mg Oral Daily   Chlorhexidine Gluconate Cloth  6 each Topical Q0600   insulin aspart  0-6 Units Subcutaneous Q4H   metroNIDAZOLE  500 mg Oral Q12H   morphine  30 mg Oral Q12H   mupirocin ointment  1 Application Nasal BID   rosuvastatin  20 mg Oral Daily     HPI: Eugene Diaz is a 57 y.o. male    Mr. Eugene Diaz was recently hospitalized from 04/27/23 to 05/01/23 for right foot diabetic ulcer infection and abscess with recurrent osteomyelitis. MRI showed recurrent osteomyelitis involving the remaining 1st metatarsal and the medial cuneiform; and small abscess along the plantar aspect of the 1st tarsometatarsal joint. Podiatry performed Chotpart amputation of right foot with clear margins. Surgical specimens sent for culture were without growth. Last seen by Dr. Thedore Diaz on 04/30/23 with recommendations for doxycycline and Augmentin for 1 week for residual cellulitis with outpatient follow up for which he did not show.   Seen by Dr. Logan Diaz on 05/24/23  with drainage and malodor reportedly taking Augmentin and noted to have failure of the soft tissue around the amputation stump with recommendation for Orthopedic consult with Dr. Lajoyce Diaz for right lower extremity BKA. Now admitted with draining wound and found to have low grade fever of 100.7 F on admission. Started on broad spectrum coverage with vancomycin, ceftriaxone and metronidazole. Blood cultures are positive for Enterococcus faecalis and Proteus species. X-rays right tibia/fibula with findings concerning for developing osteomyelitis of the distal margin of the calcaneous.   Mr. Winkeler has been taking antibiotics as prescribed with no adverse side effects. Currently with right foot pain that has been ongoing his his last surgery. No fevers or chills. Currently lives alone.   Review of Systems: Review of Systems  Constitutional:  Negative for chills, fever and weight loss.  Respiratory:  Negative for cough, shortness of breath and wheezing.   Cardiovascular:  Negative for chest pain and leg swelling.  Gastrointestinal:  Negative for abdominal pain, constipation, diarrhea, nausea and vomiting.  Musculoskeletal:        Positive for right foot pain.   Skin:  Negative for rash.     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) 05/23/2022   Depression    Diabetic neuropathy (HCC)    Edema of both lower extremities    Essential tremor    neurologist-- dr patel--- due to alcohol abuse   GERD (gastroesophageal reflux disease)    History of cellulitis 2020   left lower leg;   recurrent 03-25-2020   History of esophageal  stricture    s/p  dilatation 02-13-2018   History of osteomyelitis 2020   left great toe     Hypercholesterolemia    Hypertension    followed by pcp  (nuclear  stress test 03-11-2014 low risk w/ no ischemia, ef 65%   Normocytic anemia    followed by pcp   (03-18-2020 had transfusion's 02/ 2021)   Osteomyelitis of great toe of right foot (HCC) 08/18/2021   Peripheral vascular disease (HCC)    Severe depression (HCC) 06/09/2021   Status post incision and drainage followed by Dr Samuella Cota and pcp   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   (03-17-2020  pt states most of incision is healed with exception an area that is still draining,  daily dressing change),  foot is red but not warm to the touch and has swelling but has improved)   Suicidal ideation 06/09/2021   Syncope and collapse 06/07/2022   Syncope, near 06/20/2022   Thrombocytopenia (HCC)    chronic   Type 2 diabetes mellitus (HCC)    followed by pcp---    (03-18-2020  pt stated does not check blood sugar)   Vaccine counseling 08/29/2022   Past Surgical History:  Procedure Laterality Date   AMPUTATION Left 10/16/2019   Procedure: Left partial second ray resection; placement of antibiotic beads;  Surgeon: Park Liter, DPM;  Location: [facility];  Service: Podiatry;  Laterality: Left;   AMPUTATION Left 11/13/2019   Procedure: Left Midfoot Amputation - Transmetatarsal vs. Lisfranc; Placement antibiotic beads;  Surgeon: Park Liter, DPM;  Location: [facility];  Service: Podiatry;  Laterality: Left;   AMPUTATION Left 12/21/2019   Procedure: Chopart Amputation left foot;  Surgeon: Park Liter, DPM;  [facility];  Service: Podiatry;  Laterality: Left;   AMPUTATION Left 06/24/2020   Procedure: LEFT BELOW KNEE AMPUTATION;  Surgeon: Nadara Mustard, MD;  Location: [facility] OR;  Service: Orthopedics;  Laterality: Left;   AMPUTATION Right 03/29/2021   Procedure: AMPUTATION RAY;  Surgeon: Park Liter, DPM;  [facility]: [facility];  Service: Podiatry;  Laterality: Right;   AMPUTATION Right 04/28/2023   Procedure: RIGHT PARTIAL FOOT AMPUTATION;  Surgeon: Felecia Shelling, DPM;  [facility];  Service:  Podiatry;  Laterality: Right;   AMPUTATION TOE Left 08/14/2019   Procedure: AMPUTATION GREAT TOE;  Surgeon: Park Liter, DPM;  [facility]: [facility];  Service: Podiatry;  Laterality: Left;   APPLICATION OF WOUND VAC Left 12/21/2019   Procedure: Application Of Wound Vac;  Surgeon: Park Liter, DPM;  [facility];  Service: Podiatry;  Laterality: Left;   BIOPSY  02/13/2018   Procedure: BIOPSY;  Surgeon: West Bali, MD;  [facility];  Service: Endoscopy;;  transverse colon biopsy, gastric biopsy   BONE BIOPSY Right 04/28/2023   Procedure: BONE BIOPSY;  Surgeon: Felecia Shelling, DPM;  [facility];  Service: Podiatry;  Laterality: Right;   COLONOSCOPY WITH PROPOFOL N/A 02/13/2018   Procedure: COLONOSCOPY WITH PROPOFOL;  Surgeon: West Bali, MD;  [facility];  Service: Endoscopy;  Laterality: N/A;  11:15am   ESOPHAGOGASTRODUODENOSCOPY (EGD) WITH PROPOFOL N/A 02/13/2018   Procedure: ESOPHAGOGASTRODUODENOSCOPY (EGD) WITH PROPOFOL;  Surgeon: West Bali, MD;  [facility];  Service: Endoscopy;  Laterality: N/A;   I & D EXTREMITY Left 07/16/2019   Procedure: Insicion  AND DEBRIDEMENT EXTREMITY;  Surgeon: Park Liter, DPM;  [facility];  Service: Podiatry;  Laterality: Left;  I & D EXTREMITY Left 06/22/2020   Procedure: IRRIGATION AND DEBRIDEMENT EXTREMITY, left foot and ankle;  Surgeon: Park Liter, DPM;  [facility];  Service: Podiatry;  Laterality: Left;   I & D EXTREMITY Right 03/29/2021   Procedure: IRRIGATION AND DEBRIDEMENT EXTREMITY;  Surgeon: Park Liter, DPM;  [facility];  Service: Podiatry;  Laterality: Right;   INCISION AND DRAINAGE OF WOUND Left 10/13/2019   Procedure: IRRIGATION AND DEBRIDEMENT WOUND OF LEFT FOOT AND FIRST METATARSAL RESECTION;  Surgeon: Park Liter, DPM;  [facility];  Service: Podiatry;  Laterality: Left;   IRRIGATION AND DEBRIDEMENT FOOT Left 11/11/2019   Procedure: Left Foot Wound  Irrigation and Debridement;  Surgeon: Park Liter, DPM;  [facility]: [facility];  Service: Podiatry;  Laterality: Left;   Left arm     Left arm repair (tendon and artery)   PERCUTANEOUS PINNING  07/16/2019   Procedure: Open Reduction Percutaneous Pinning Extremity;  Surgeon: Park Liter, DPM;  [facility];  Service: Podiatry;;   PILONIDAL CYST EXCISION N/A 08/08/2014   Procedure: CYST EXCISION PILONIDAL EXTENSIVE;  Surgeon: Dalia Heading, MD;  [facility]: [facility];  Service: General;  Laterality: N/A;   POLYPECTOMY  02/13/2018   Procedure: POLYPECTOMY;  Surgeon: West Bali, MD;  [facility];  Service: Endoscopy;;  transverse colon polyp hs, rectal polyps times 2   SAVORY DILATION N/A 02/13/2018   Procedure: SAVORY DILATION;  Surgeon: West Bali, MD;  [facility];  Service: Endoscopy;  Laterality: N/A;   TENDON LENGTHENING Right 12/04/2021   Procedure: TENDON LENGTHENING;  Surgeon: Asencion Islam, DPM;  [facility];  Service: Podiatry;  Laterality: Right;   TRANSMETATARSAL AMPUTATION Right 12/04/2021   Procedure: TRANSMETATARSAL AMPUTATION;  Surgeon: Asencion Islam, DPM;  [facility];  Service: Podiatry;  Laterality: Right;   WOUND DEBRIDEMENT Left 09/04/2019   Procedure: DEBRIDEMENT WOUND WITH COMPLEX  REPAIR OF DEHISCENCE;  Surgeon: Park Liter, DPM;  Location: [facility];  Service: Podiatry;  Laterality: Left;  Leave patient on stretcher   WOUND DEBRIDEMENT Left 10/16/2019   Procedure: Left foot wound debridement and closure;  Surgeon: Park Liter, DPM;  [facility]: [facility];  Service: Podiatry;  Laterality: Left;   WOUND DEBRIDEMENT Left 12/18/2019   Procedure: LEFT FOOT DEBRIDEMENT WITH PARTIAL INCISION OF INFECTED BONE;  Surgeon: Park Liter, DPM;  [facility];  Service: Podiatry;  Laterality: Left;  LEFT FOOT DEBRIDEMENT WITH PARTIAL INCISION OF INFECTED BONE   WOUND DEBRIDEMENT Right 04/01/2021   Procedure: Right foot  wound debridement and irrigation and closure, bone biopsy;  Surgeon: Park Liter, DPM;  [facility];  Service: Podiatry;  Laterality: Right;     Social History   Tobacco Use   Smoking status: Every Day    Types: Cigars   Smokeless tobacco: Never   Tobacco comments:    Quit smoking cigarettes; now smokes cigars, wants to quit  Vaping Use   Vaping status: Never Used  Substance Use Topics   Alcohol use: Yes    Alcohol/week: 50.0 standard drinks of alcohol    Types: 50 Standard drinks or equivalent per week    Comment: Drinks 6 beers, liquor 4-6oz (both daily) x 35 years; now states he "drinks a few beers and let it go"   Drug use: Not Currently    Types: Marijuana    Family History  Problem Relation Age of Onset  Heart attack Mother        Living, 103   Cancer Father        Deceased   Healthy Brother    Healthy Sister    Colon cancer Neg Hx    Colon polyps Neg Hx     No Known Allergies  OBJECTIVE: Blood pressure 136/78, pulse 66, temperature 97.7 F (36.5 C), temperature source Oral, resp. rate 18, height 6' 2.02" (1.88 m), weight 108.6 kg, SpO2 98%.  Physical Exam Constitutional:      General: He is not in acute distress.    Appearance: He is well-developed.  Cardiovascular:     Rate and Rhythm: Normal rate and regular rhythm.     Heart sounds: Normal heart sounds.  Pulmonary:     Effort: Pulmonary effort is normal.     Breath sounds: Normal breath sounds.  Musculoskeletal:     Comments: Left BKA; Right foot wrapped in dressing.   Skin:    General: Skin is warm and dry.  Neurological:     Mental Status: He is alert.  Psychiatric:        Mood and Affect: Mood normal.     Lab Results Lab Results  Component Value Date   WBC 10.6 (H) 05/30/2023   HGB 13.0 05/30/2023   HCT 39.2 05/30/2023   MCV 91.8 05/30/2023   PLT 357 05/30/2023    Lab Results  Component Value Date   CREATININE 0.80 05/30/2023   BUN 10 05/30/2023   NA 136 05/30/2023   K  3.6 05/30/2023   CL 104 05/30/2023   CO2 22 05/30/2023    Lab Results  Component Value Date   ALT 10 04/27/2023   AST 14 (L) 04/27/2023   ALKPHOS 91 04/27/2023   BILITOT 0.6 04/27/2023     Microbiology: Recent Results (from the past 240 hour(s))  Culture, blood (Routine X 2) w Reflex to ID Panel     Status: None (Preliminary result)   Collection Time: 05/29/23  7:48 PM   Specimen: BLOOD  Result Value Ref Range Status   Specimen Description BLOOD SITE NOT SPECIFIED  Final   Special Requests   Final    BOTTLES DRAWN AEROBIC AND ANAEROBIC Blood Culture results may not be optimal due to an excessive volume of blood received in culture bottles   Culture  Setup Time   Final    GRAM NEGATIVE RODS GRAM POSITIVE COCCI IN CLUSTERS ANAEROBIC BOTTLE ONLY CRITICAL RESULT CALLED TO, READ BACK BY AND VERIFIED WITH: PHARMD E.SINCLAIR AT 1206 ON 05/30/2023 BY T.SAAD. Performed at [facility], [address] [place], [place] [address]    Culture Romie Minus NEGATIVE RODS [facility] POSITIVE COCCI   Final   Report Status PENDING  Incomplete  Blood Culture ID Panel (Reflexed)     Status: Abnormal   Collection Time: 05/29/23  7:48 PM  Result Value Ref Range Status   Enterococcus faecalis DETECTED (A) NOT DETECTED Final    Comment: CRITICAL RESULT CALLED TO, READ BACK BY AND VERIFIED WITH: PHARMD E.SINCLAIR AT 1206 ON 05/30/2023 BY T.SAAD.    Enterococcus Faecium NOT DETECTED NOT DETECTED Final   Listeria monocytogenes NOT DETECTED NOT DETECTED Final   Staphylococcus species NOT DETECTED NOT DETECTED Final   Staphylococcus aureus (BCID) NOT DETECTED NOT DETECTED Final   Staphylococcus epidermidis NOT DETECTED NOT DETECTED Final   Staphylococcus lugdunensis NOT DETECTED NOT DETECTED Final   Streptococcus species NOT DETECTED NOT DETECTED Final   Streptococcus agalactiae NOT  DETECTED NOT DETECTED Final   Streptococcus pneumoniae NOT DETECTED NOT DETECTED Final   Streptococcus pyogenes NOT DETECTED  NOT DETECTED Final   A.calcoaceticus-baumannii NOT DETECTED NOT DETECTED Final   Bacteroides fragilis NOT DETECTED NOT DETECTED Final   Enterobacterales DETECTED (A) NOT DETECTED Final    Comment: Enterobacterales represent a large order of gram negative bacteria, not a single organism. CRITICAL RESULT CALLED TO, READ BACK BY AND VERIFIED WITH: PHARMD E.SINCLAIR AT 1206 ON 05/30/2023 BY T.SAAD.    Enterobacter cloacae complex NOT DETECTED NOT DETECTED Final   Escherichia coli NOT DETECTED NOT DETECTED Final   Klebsiella aerogenes NOT DETECTED NOT DETECTED Final   Klebsiella oxytoca NOT DETECTED NOT DETECTED Final   Klebsiella pneumoniae NOT DETECTED NOT DETECTED Final   Proteus species DETECTED (A) NOT DETECTED Final    Comment: CRITICAL RESULT CALLED TO, READ BACK BY AND VERIFIED WITH: PHARMD E.SINCLAIR AT 1206 ON 05/30/2023 BY T.SAAD.    Salmonella species NOT DETECTED NOT DETECTED Final   Serratia marcescens NOT DETECTED NOT DETECTED Final   Haemophilus influenzae NOT DETECTED NOT DETECTED Final   Neisseria meningitidis NOT DETECTED NOT DETECTED Final   Pseudomonas aeruginosa NOT DETECTED NOT DETECTED Final   Stenotrophomonas maltophilia NOT DETECTED NOT DETECTED Final   Candida albicans NOT DETECTED NOT DETECTED Final   Candida auris NOT DETECTED NOT DETECTED Final   Candida glabrata NOT DETECTED NOT DETECTED Final   Candida krusei NOT DETECTED NOT DETECTED Final   Candida parapsilosis NOT DETECTED NOT DETECTED Final   Candida tropicalis NOT DETECTED NOT DETECTED Final   Cryptococcus neoformans/gattii NOT DETECTED NOT DETECTED Final   CTX-M ESBL NOT DETECTED NOT DETECTED Final   Carbapenem resistance IMP NOT DETECTED NOT DETECTED Final   Carbapenem resistance KPC NOT DETECTED NOT DETECTED Final   Carbapenem resistance NDM NOT DETECTED NOT DETECTED Final   Carbapenem resist OXA 48 LIKE NOT DETECTED NOT DETECTED Final   Vancomycin resistance NOT DETECTED NOT DETECTED Final    Carbapenem resistance VIM NOT DETECTED NOT DETECTED Final    Comment: Performed at [facility], [address], [place], [place] [address]  Culture, blood (Routine X 2) w Reflex to ID Panel     Status: None (Preliminary result)   Collection Time: 05/29/23  8:04 PM   Specimen: BLOOD  Result Value Ref Range Status   Specimen Description BLOOD SITE NOT SPECIFIED  Final   Special Requests   Final    BOTTLES DRAWN AEROBIC AND ANAEROBIC Blood Culture adequate volume   Culture   Final    NO GROWTH < 12 HOURS Performed at [facility], [address] [place], [place] [address]    Report Status PENDING  Incomplete  Surgical PCR screen     Status: Abnormal   Collection Time: 05/29/23 10:55 PM   Specimen: Nasal Mucosa; Nasal Swab  Result Value Ref Range Status   MRSA, PCR POSITIVE (A) NEGATIVE Final    Comment: RESULT CALLED TO, READ BACK BY AND VERIFIED WITH: S LYN RN 05/30/23 @ 0106 BY AB    Staphylococcus aureus POSITIVE (A) NEGATIVE Final    Comment: (NOTE) The Xpert SA Assay (FDA approved for NASAL specimens in patients 59 years of age and older), is one component of a comprehensive surveillance program. It is not intended to diagnose infection nor to guide or monitor treatment. Performed at [facility], [address], [place], [place] [address]    Imaging DG Tibia/Fibula Right  Result Date: 05/29/2023 CLINICAL DATA:  Right hindfoot amputation, drainage from surgical wound EXAM: RIGHT TIBIA AND FIBULA - 2 VIEW COMPARISON:  04/28/2023 FINDINGS: Frontal and lateral views of the right tibia and fibula are obtained. Postsurgical changes are seen from amputation at the level of the right hindfoot. Resolution of subcutaneous gas at the surgical site. There is increased soft tissue swelling at the amputation margins. Skin staples are again identified. Cortical irregularity along the distal margin of the calcaneus has developed since prior exam, and developing osteomyelitis  is a concern. The right tibia and fibula are unremarkable. The knee and ankle are in anatomic alignment. There is diffuse subcutaneous edema throughout the right lower leg. IMPRESSION: 1. Findings concerning for developing osteomyelitis of the distal margin of the calcaneus along the prior hindfoot amputation site. 2. Diffuse soft tissue swelling throughout the right lower leg and foot. Electronically Signed   By: Sharlet Salina M.D.   On: 05/29/2023 17:56     Marcos Eke, NP [facility]  05/30/2023  12:30 PM

## 2023-05-30 NOTE — Progress Notes (Signed)
  Progress Note   Patient: Eugene Diaz UJW:[id number] DOB: May 22, 1966 DOA: 05/29/2023     1 DOS: the patient was seen and examined on 05/30/2023        Brief hospital course: Mr. Norsworthy is a 57 y.o. M with obesity, DM, HTN, left BKA and recent right Chopart amputation who presented with fever, chills and drainage/dehiscence of his recent surgical right foot wound.     Assessment and Plan: * Sepsis due to enterococcus and Proteus bacteremia due to osteomyelitis of the right foot [facility]) Presented with tachycardia, fever.  Found to have bacteremia with enterococcus and proteus. - Consult ID - Continue vancomycin, Rocephin and Flagyl - Consult Podiatry and Ortho for amputation   Acute osteomyelitis of calcaneum [facility]) Discussed with Ortho today, they recommend Podiatry consult for evaluation first. [name] last Dec normal. - Consult Podiatry  Obesity (BMI 30-39.9) BMI 30.7  Chronic pain - Continue home morphine  GAD (generalized anxiety disorder) - Continue Xanax  CAD in native artery - Continue Crestor  Essential hypertension BP controlled - Continue amlodipine  HLD (hyperlipidemia) - Continue Crestor  Type 2 diabetes mellitus with peripheral neuropathy (HCC) Glucose controlled - Continue aspart - Continue amitriptyline for diabetes related neuropathy - Hold metformin          Subjective: Patient is feeling somewhat better since starting fluids and antibiotics.  He has no confusion, chest pain, respiratory symptoms.  No nursing concerns.     Physical Exam: BP 136/78   Pulse 66   Temp 97.7 F (36.5 C) (Oral)   Resp 18   Ht 6' 2.02" (1.88 m)   Wt 108.6 kg   SpO2 98%   BMI 30.73 kg/m   Adult male, lying in bed, appears weak and tired RRR, no murmurs, no peripheral edema Respiratory normal, lungs clear without rales or wheezes Abdomen soft without tenderness palpation Status post left BKA The right leg is red and tender, there is a bandage over  his recent amputation stump Attention normal, affect appropriate, judgment and insight appear normal    Data Reviewed: Discussed with orthopedics, Earney Hamburg, also Dr. Logan Bores and Ralene Cork from podiatry.  Also discussed with infectious disease. Basic metabolic panel shows normal renal function and electrolytes CBC normal Blood culture with Proteus and Enterococcus    Family Communication: None present    Disposition: Status is: Inpatient         Author: Alberteen Sam, MD 05/30/2023 3:53 PM  For on call review [email or web address].

## 2023-05-30 NOTE — Assessment & Plan Note (Signed)
Continue Crestor 

## 2023-05-30 NOTE — Assessment & Plan Note (Signed)
Glucose well controlled - Continue aspart - Continue amitriptyline for diabetes related neuropathy - Hold metformin

## 2023-05-30 NOTE — Assessment & Plan Note (Signed)
BMI 30.7 ?

## 2023-05-30 NOTE — Assessment & Plan Note (Signed)
BP controlled. Continue amlodipine.

## 2023-05-30 NOTE — Assessment & Plan Note (Signed)
Discussed with Ortho today, they recommend Podiatry consult for evaluation first. [name] last Dec normal. - Consult Podiatry

## 2023-05-30 NOTE — Progress Notes (Signed)
PHARMACY - PHYSICIAN COMMUNICATION CRITICAL VALUE ALERT - BLOOD CULTURE IDENTIFICATION (BCID)  Eugene Diaz is an 57 y.o. male who presented to [facility] on 05/29/2023 with a chief complaint of right foot pain and drainage.   Assessment:   57 year old male admitted with right foot pain and drainage from TMA site. TMA performed 6/28. Now with proteus and e faecalis in 1/2 blood cultures   Name of physician (or Provider) Contacted: [place]  ID team   Current antibiotics: Vanc/ceftriaxone   Changes to prescribed antibiotics recommended:  None at the moment . Patient is well covered   Results for orders placed or performed during the hospital encounter of 05/29/23  Blood Culture ID Panel (Reflexed) (Collected: 05/29/2023  7:48 PM)  Result Value Ref Range   Enterococcus faecalis DETECTED (A) NOT DETECTED   Enterococcus Faecium NOT DETECTED NOT DETECTED   Listeria monocytogenes NOT DETECTED NOT DETECTED   Staphylococcus species NOT DETECTED NOT DETECTED   Staphylococcus aureus (BCID) NOT DETECTED NOT DETECTED   Staphylococcus epidermidis NOT DETECTED NOT DETECTED   Staphylococcus lugdunensis NOT DETECTED NOT DETECTED   Streptococcus species NOT DETECTED NOT DETECTED   Streptococcus agalactiae NOT DETECTED NOT DETECTED   Streptococcus pneumoniae NOT DETECTED NOT DETECTED   Streptococcus pyogenes NOT DETECTED NOT DETECTED   A.calcoaceticus-baumannii NOT DETECTED NOT DETECTED   Bacteroides fragilis NOT DETECTED NOT DETECTED   Enterobacterales DETECTED (A) NOT DETECTED   Enterobacter cloacae complex NOT DETECTED NOT DETECTED   Escherichia coli NOT DETECTED NOT DETECTED   Klebsiella aerogenes NOT DETECTED NOT DETECTED   Klebsiella oxytoca NOT DETECTED NOT DETECTED   Klebsiella pneumoniae NOT DETECTED NOT DETECTED   Proteus species DETECTED (A) NOT DETECTED   Salmonella species NOT DETECTED NOT DETECTED   Serratia marcescens NOT DETECTED NOT DETECTED   Haemophilus influenzae NOT  DETECTED NOT DETECTED   Neisseria meningitidis NOT DETECTED NOT DETECTED   Pseudomonas aeruginosa NOT DETECTED NOT DETECTED   Stenotrophomonas maltophilia NOT DETECTED NOT DETECTED   Candida albicans NOT DETECTED NOT DETECTED   Candida auris NOT DETECTED NOT DETECTED   Candida glabrata NOT DETECTED NOT DETECTED   Candida krusei NOT DETECTED NOT DETECTED   Candida parapsilosis NOT DETECTED NOT DETECTED   Candida tropicalis NOT DETECTED NOT DETECTED   Cryptococcus neoformans/gattii NOT DETECTED NOT DETECTED   CTX-M ESBL NOT DETECTED NOT DETECTED   Carbapenem resistance IMP NOT DETECTED NOT DETECTED   Carbapenem resistance KPC NOT DETECTED NOT DETECTED   Carbapenem resistance NDM NOT DETECTED NOT DETECTED   Carbapenem resist OXA 48 LIKE NOT DETECTED NOT DETECTED   Vancomycin resistance NOT DETECTED NOT DETECTED   Carbapenem resistance VIM NOT DETECTED NOT DETECTED    Sharin Mons, PharmD, BCPS, BCIDP Infectious Diseases Clinical Pharmacist Phone: [phone] 05/30/2023  1:14 PM

## 2023-05-30 NOTE — TOC Initial Note (Signed)
Transition of Care (TOC) - Initial/Assessment Note   Patient from home alone but states he has assistance if needed.   He has crutches , bedside commode and prosthetic leg.   At last discharge [facility] was arranged. When [facility] called to schedule admission visit patient declined.   Patient told NCM that he did not feel he needed HHPT at that time.   NCM will await treatment plan and PT evaluations .  Patient Details  Name: Eugene Diaz MRN: [id number] Date of Birth: 04-29-1966  Transition of Care [facility]) CM/SW Contact:    Kingsley Plan, RN Phone Number: 05/30/2023, 10:57 AM  Clinical Narrative:                   Expected Discharge Plan:  (await PT eval) Barriers to Discharge: Continued Medical Work up (IV ABX)   Patient Goals and CMS Choice Patient states their goals for this hospitalization and ongoing recovery are:: to return to home          Expected Discharge Plan and Services   Discharge Planning Services: CM Consult   Living arrangements for the past 2 months: Single Family Home                 DME Arranged:  (await PT eval and plan of care)         HH Arranged:  (await PT eval and plan of care)          Prior Living Arrangements/Services Living arrangements for the past 2 months: Single Family Home Lives with:: Self Patient language and need for interpreter reviewed:: Yes Do you feel safe going back to the place where you live?: Yes      Need for Family Participation in Patient Care: Yes (Comment) Care giver support system in place?: Yes (comment) Current home services: DME Criminal Activity/Legal Involvement Pertinent to Current Situation/Hospitalization: No - Comment as needed  Activities of Daily Living Home Assistive Devices/Equipment: Cane (specify quad or straight) ADL Screening (condition at time of admission) Patient's cognitive ability adequate to safely complete daily activities?: Yes Is the patient deaf or have  difficulty hearing?: No Does the patient have difficulty seeing, even when wearing glasses/contacts?: Yes Does the patient have difficulty concentrating, remembering, or making decisions?: No Patient able to express need for assistance with ADLs?: Yes Does the patient have difficulty dressing or bathing?: No Independently performs ADLs?: Yes (appropriate for developmental age) Does the patient have difficulty walking or climbing stairs?: No Weakness of Legs: None Weakness of Arms/Hands: None  Permission Sought/Granted   Permission granted to share information with : No              Emotional Assessment Appearance:: Appears stated age     Orientation: : Oriented to Self, Oriented to Place, Oriented to  Time, Oriented to Situation Alcohol / Substance Use: Not Applicable Psych Involvement: No (comment)  Admission diagnosis:  Osteomyelitis of right foot (HCC) [M86.9] Osteomyelitis, unspecified site, unspecified type (HCC) [M86.9] Patient Active Problem List   Diagnosis Date Noted   Obesity (BMI 30-39.9) 05/30/2023   Osteomyelitis of right foot (HCC) 05/29/2023   Chronic pain 05/29/2023   Osteomyelitis (HCC) 04/27/2023   GAD (generalized anxiety disorder) 10/13/2022   Cellulitis of foot, right 10/12/2022   Vaccine counseling 08/29/2022   Syncope, near 06/20/2022   Syncope and collapse 06/07/2022   CAD in native artery 06/07/2022   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) 05/23/2022   LUE weakness 05/22/2022  Wound infection 05/20/2022   Alcohol use disorder 03/03/2022   Chest pain- w atypical and typical features 03/02/2022   Tobacco abuse 03/02/2022   Essential hypertension 03/02/2022   Gangrene right third toe associated with type 2 diabetes mellitus (HCC), likely osteomyelitis 12/03/2021   Type 2 diabetes mellitus (HCC) 12/03/2021   Right foot infection    Gangrene (HCC)    Osteomyelitis of great toe of right foot (HCC) 08/18/2021   Severe depression  (HCC) 06/09/2021   Suicidal ideation 06/09/2021   Diabetic wet gangrene of the foot (HCC)    Unilateral complete BKA, left, 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   Acute blood loss anemia 01/04/2020   Asthma    Depression with anxiety    Hyperglycemia 12/17/2019   Diabetic foot ulcer (HCC)    Anemia, chronic disease 11/10/2019   Marijuana use 11/10/2019   Wound dehiscence 08/31/2019   Hyponatremia 08/14/2019   Normocytic anemia 08/14/2019   B12 deficiency 08/14/2019   Subacute osteomyelitis, left ankle and foot (HCC)    GERD (gastroesophageal reflux disease) 12/19/2018   Hepatic steatosis 06/18/2018   Gastritis, erosive    Dysphagia, idiopathic 12/27/2017   Rectal bleeding 12/27/2017   Intention tremor 06/03/2016   Left knee pain 12/04/2015   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   Diabetic neuropathy (HCC) 10/08/2014   Chest pain, rule out acute myocardial infarction 03/10/2014   Chronic alcohol abuse 03/10/2014   Type 2 diabetes mellitus with peripheral neuropathy (HCC)    HLD (hyperlipidemia)    PCP:  Elfredia Nevins, MD Pharmacy:   [address] [facility] - [place], [place] - [phone] [place] [address] Phone: [phone] Fax: [phone]     Social Determinants of Health (SDOH) Social History: SDOH Screenings   Food Insecurity: No Food Insecurity (05/29/2023)  Housing: Patient Declined (05/29/2023)  Transportation Needs: No Transportation Needs (05/29/2023)  Utilities: Not At Risk (05/29/2023)  Depression (PHQ2-9): Low Risk  (04/24/2023)  Tobacco Use: High Risk (05/29/2023)   SDOH Interventions:     Readmission Risk Interventions    05/23/2022   12:50 PM  Readmission Risk Prevention Plan  Transportation Screening Complete  HRI or Home Care Consult Complete  Social Work Consult for Recovery Care Planning/Counseling Complete  Palliative Care Screening Not Applicable  Medication Review [employer]) Complete

## 2023-05-30 NOTE — Hospital Course (Signed)
Mr. Ede is a 57 y.o. M with obesity, DM, HTN, left BKA and recent right Chopart amputation who presented with fever, chills and drainage/dehiscence of his recent surgical right foot wound.

## 2023-05-30 NOTE — Assessment & Plan Note (Signed)
-   Continue Xanax 

## 2023-05-31 ENCOUNTER — Inpatient Hospital Stay (HOSPITAL_COMMUNITY): Payer: Medicare HMO

## 2023-05-31 DIAGNOSIS — R7881 Bacteremia: Secondary | ICD-10-CM

## 2023-05-31 DIAGNOSIS — B964 Proteus (mirabilis) (morganii) as the cause of diseases classified elsewhere: Secondary | ICD-10-CM

## 2023-05-31 DIAGNOSIS — M86171 Other acute osteomyelitis, right ankle and foot: Secondary | ICD-10-CM | POA: Diagnosis not present

## 2023-05-31 DIAGNOSIS — M869 Osteomyelitis, unspecified: Secondary | ICD-10-CM | POA: Diagnosis not present

## 2023-05-31 DIAGNOSIS — M86271 Subacute osteomyelitis, right ankle and foot: Secondary | ICD-10-CM | POA: Diagnosis not present

## 2023-05-31 DIAGNOSIS — I2510 Atherosclerotic heart disease of native coronary artery without angina pectoris: Secondary | ICD-10-CM | POA: Diagnosis not present

## 2023-05-31 DIAGNOSIS — F411 Generalized anxiety disorder: Secondary | ICD-10-CM | POA: Diagnosis not present

## 2023-05-31 DIAGNOSIS — Z79899 Other long term (current) drug therapy: Secondary | ICD-10-CM

## 2023-05-31 LAB — GLUCOSE, CAPILLARY
Glucose-Capillary: 110 mg/dL — ABNORMAL HIGH (ref 70–99)
Glucose-Capillary: 113 mg/dL — ABNORMAL HIGH (ref 70–99)
Glucose-Capillary: 141 mg/dL — ABNORMAL HIGH (ref 70–99)
Glucose-Capillary: 169 mg/dL — ABNORMAL HIGH (ref 70–99)
Glucose-Capillary: 169 mg/dL — ABNORMAL HIGH (ref 70–99)
Glucose-Capillary: 177 mg/dL — ABNORMAL HIGH (ref 70–99)

## 2023-05-31 MED ORDER — SODIUM CHLORIDE 0.9 % IV SOLN
3.0000 g | Freq: Four times a day (QID) | INTRAVENOUS | Status: DC
  Administered 2023-05-31 – 2023-06-09 (×35): 3 g via INTRAVENOUS
  Filled 2023-05-31 (×36): qty 8

## 2023-05-31 MED ORDER — POLYETHYLENE GLYCOL 3350 17 G PO PACK
17.0000 g | PACK | Freq: Every day | ORAL | Status: DC
  Administered 2023-05-31: 17 g via ORAL
  Filled 2023-05-31 (×6): qty 1

## 2023-05-31 MED ORDER — SENNOSIDES-DOCUSATE SODIUM 8.6-50 MG PO TABS
1.0000 | ORAL_TABLET | Freq: Every day | ORAL | Status: DC
  Administered 2023-05-31 – 2023-06-07 (×5): 1 via ORAL
  Filled 2023-05-31 (×9): qty 1

## 2023-05-31 NOTE — Progress Notes (Signed)
[facility] Infectious Diseases Follow Up Note  Patient Identification: Patient Name: Eugene Diaz MRN: [id number] Admit Date: 05/29/2023  3:45 PM Age: 57 y.o.Today's Date: 05/31/2023  Reason for Visit: Bacteremia, osteomyelitis  Principal Problem:   Sepsis due to enterococcus and Proteus bacteremia due to osteomyelitis of the right foot [facility]) Active Problems:   Type 2 diabetes mellitus with peripheral neuropathy (HCC)   HLD (hyperlipidemia)   Essential hypertension   CAD in native artery   GAD (generalized anxiety disorder)   Chronic pain   Obesity (BMI 30-39.9)   Acute osteomyelitis of calcaneum (HCC)   Current Antibiotics Vancomycin ceftriaxone and metronidazole 7/29 Total days of antibiotics day 3  Lines/Hardwares:   Interval Events:Fever since resolved Labs remarkable for no leukocytosis Seen by podiatry, recommended orthopedics consult for BKA  Assessment 57 year old male with history of HTN, type II DM, CAD, PVD, anxiety/depression, 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  Patient was discharged on doxycycline and Augmentin for a week post OR last admission.  He followed up with podiatry on 7/8 and a course of Augmentin was extended however he developed wound dehiscence with purulent drainage.  He was then advised to go to ED on follow-up on 7/24 for possible need for BKA.   # Wound dehiscence of rt chopart amputation/osteomyelitis of calcaneus/abscess # Polymicrobial bacteremia 2/2 above (Proteus mirabilis, E faecalis and proteus identified by [facility] ID, strep mitis/oralis and Proteus mirabilis isolated in blood cx) TTE 7/31 moderate thickening of the  aortic valve.   Recommendations DC Vancomycin, will switch IV  abtx to Unasyn only  Fu blood cx data to decide if TEE needed  Orthopedics consult is pending  Monitor CBC and BMP on abtx   Rest of the management as per the primary team. Thank you for the consult. Please page with pertinent questions or concerns.  ______________________________________________________________________ Subjective patient seen and examined at the bedside. No complaints.   Vitals BP 110/65 (BP Location: Left Arm)   Pulse 78   Temp 98.2 F (36.8 C) (Oral)   Resp 17   Ht 6' 2.02" (1.88 m)   Wt 108.6 kg   SpO2 97%   BMI 30.73 kg/m     Physical Exam Constitutional: Adult male sitting in the bed and having lunch    Comments:   Cardiovascular:     Rate and Rhythm: Normal rate and regular rhythm.     Heart sounds:   Pulmonary:     Effort: Pulmonary effort is normal.     Comments:   Abdominal:     Palpations:    Tenderness: Nondistended  Musculoskeletal:        General: No swelling or tenderness.  Left foot is bandaged C/C/I  Skin:    Comments: No rashes     Neurological:     General: Awake, alert and oriented, following commands  Psychiatric:        Mood and Affect: Mood normal.   Pertinent Microbiology Results for orders placed or performed during the hospital encounter of 05/29/23  Culture, blood (Routine X 2) w Reflex to ID Panel     Status: Abnormal (Preliminary result)   Collection Time: 05/29/23  7:48 PM   Specimen: BLOOD  Result Value Ref Range Status   Specimen Description BLOOD SITE NOT SPECIFIED  Final  Special Requests   Final    BOTTLES DRAWN AEROBIC AND ANAEROBIC Blood Culture results may not be optimal due to an excessive volume of blood received in culture bottles   Culture  Setup Time   Final    GRAM NEGATIVE RODS GRAM POSITIVE COCCI IN CLUSTERS ANAEROBIC BOTTLE ONLY CRITICAL RESULT CALLED TO, READ BACK BY AND VERIFIED WITH: PHARMD E.SINCLAIR AT 1206 ON 05/30/2023 BY T.SAAD.    Culture (A)  Final    PROTEUS  MIRABILIS GRAM POSITIVE COCCI IDENTIFICATION AND SUSCEPTIBILITIES TO FOLLOW Performed at [facility], [address] [place], [place] [address]    Report Status PENDING  Incomplete  Blood Culture ID Panel (Reflexed)     Status: Abnormal   Collection Time: 05/29/23  7:48 PM  Result Value Ref Range Status   Enterococcus faecalis DETECTED (A) NOT DETECTED Final    Comment: CRITICAL RESULT CALLED TO, READ BACK BY AND VERIFIED WITH: PHARMD E.SINCLAIR AT 1206 ON 05/30/2023 BY T.SAAD.    Enterococcus Faecium NOT DETECTED NOT DETECTED Final   Listeria monocytogenes NOT DETECTED NOT DETECTED Final   Staphylococcus species NOT DETECTED NOT DETECTED Final   Staphylococcus aureus (BCID) NOT DETECTED NOT DETECTED Final   Staphylococcus epidermidis NOT DETECTED NOT DETECTED Final   Staphylococcus lugdunensis NOT DETECTED NOT DETECTED Final   Streptococcus species NOT DETECTED NOT DETECTED Final   Streptococcus agalactiae NOT DETECTED NOT DETECTED Final   Streptococcus pneumoniae NOT DETECTED NOT DETECTED Final   Streptococcus pyogenes NOT DETECTED NOT DETECTED Final   A.calcoaceticus-baumannii NOT DETECTED NOT DETECTED Final   Bacteroides fragilis NOT DETECTED NOT DETECTED Final   Enterobacterales DETECTED (A) NOT DETECTED Final    Comment: Enterobacterales represent a large order of gram negative bacteria, not a single organism. CRITICAL RESULT CALLED TO, READ BACK BY AND VERIFIED WITH: PHARMD E.SINCLAIR AT 1206 ON 05/30/2023 BY T.SAAD.    Enterobacter cloacae complex NOT DETECTED NOT DETECTED Final   Escherichia coli NOT DETECTED NOT DETECTED Final   Klebsiella aerogenes NOT DETECTED NOT DETECTED Final   Klebsiella oxytoca NOT DETECTED NOT DETECTED Final   Klebsiella pneumoniae NOT DETECTED NOT DETECTED Final   Proteus species DETECTED (A) NOT DETECTED Final    Comment: CRITICAL RESULT CALLED TO, READ BACK BY AND VERIFIED WITH: PHARMD E.SINCLAIR AT 1206 ON 05/30/2023 BY T.SAAD.     Salmonella species NOT DETECTED NOT DETECTED Final   Serratia marcescens NOT DETECTED NOT DETECTED Final   Haemophilus influenzae NOT DETECTED NOT DETECTED Final   Neisseria meningitidis NOT DETECTED NOT DETECTED Final   Pseudomonas aeruginosa NOT DETECTED NOT DETECTED Final   Stenotrophomonas maltophilia NOT DETECTED NOT DETECTED Final   Candida albicans NOT DETECTED NOT DETECTED Final   Candida auris NOT DETECTED NOT DETECTED Final   Candida glabrata NOT DETECTED NOT DETECTED Final   Candida krusei NOT DETECTED NOT DETECTED Final   Candida parapsilosis NOT DETECTED NOT DETECTED Final   Candida tropicalis NOT DETECTED NOT DETECTED Final   Cryptococcus neoformans/gattii NOT DETECTED NOT DETECTED Final   CTX-M ESBL NOT DETECTED NOT DETECTED Final   Carbapenem resistance IMP NOT DETECTED NOT DETECTED Final   Carbapenem resistance KPC NOT DETECTED NOT DETECTED Final   Carbapenem resistance NDM NOT DETECTED NOT DETECTED Final   Carbapenem resist OXA 48 LIKE NOT DETECTED NOT DETECTED Final   Vancomycin resistance NOT DETECTED NOT DETECTED Final   Carbapenem resistance VIM NOT DETECTED NOT DETECTED Final    Comment: Performed at [facility],  [address], [place], [place] [address]  Culture, blood (Routine X 2) w Reflex to ID Panel     Status: Abnormal (Preliminary result)   Collection Time: 05/29/23  8:04 PM   Specimen: BLOOD  Result Value Ref Range Status   Specimen Description BLOOD SITE NOT SPECIFIED  Final   Special Requests   Final    BOTTLES DRAWN AEROBIC AND ANAEROBIC Blood Culture adequate volume   Culture  Setup Time   Final    GRAM POSITIVE COCCI IN CHAINS ANAEROBIC BOTTLE ONLY CRITICAL VALUE NOTED.  VALUE IS CONSISTENT WITH PREVIOUSLY REPORTED AND CALLED VALUE. GRAM NEGATIVE RODS AEROBIC BOTTLE ONLY VALUE IS CONSISTENT WITH PREVIOUSLY REPORT AND CALLED VALUE    Culture (A)  Final    STREPTOCOCCUS MITIS/ORALIS THE SIGNIFICANCE OF ISOLATING THIS ORGANISM FROM A  SINGLE SET OF BLOOD CULTURES WHEN MULTIPLE SETS ARE DRAWN IS UNCERTAIN. PLEASE NOTIFY THE MICROBIOLOGY DEPARTMENT WITHIN ONE WEEK IF SPECIATION AND SENSITIVITIES ARE REQUIRED. Performed at [facility], [address] [place], [place] [address]    Report Status PENDING  Incomplete  Surgical PCR screen     Status: Abnormal   Collection Time: 05/29/23 10:55 PM   Specimen: Nasal Mucosa; Nasal Swab  Result Value Ref Range Status   MRSA, PCR POSITIVE (A) NEGATIVE Final    Comment: RESULT CALLED TO, READ BACK BY AND VERIFIED WITH: S LYN RN 05/30/23 @ 0106 BY AB    Staphylococcus aureus POSITIVE (A) NEGATIVE Final    Comment: (NOTE) The Xpert SA Assay (FDA approved for NASAL specimens in patients 82 years of age and older), is one component of a comprehensive surveillance program. It is not intended to diagnose infection nor to guide or monitor treatment. Performed at [facility], [address], [place], [place] [address]   Culture, blood (Routine X 2) w Reflex to ID Panel     Status: None (Preliminary result)   Collection Time: 05/31/23  6:17 AM   Specimen: BLOOD  Result Value Ref Range Status   Specimen Description BLOOD LEFT ANTECUBITAL  Final   Special Requests   Final    BOTTLES DRAWN AEROBIC AND ANAEROBIC Blood Culture adequate volume   Culture   Final    NO GROWTH <12 HOURS Performed at [facility], [address], [place], [place] [address]    Report Status PENDING  Incomplete  Culture, blood (Routine X 2) w Reflex to ID Panel     Status: None (Preliminary result)   Collection Time: 05/31/23  6:17 AM   Specimen: BLOOD LEFT HAND  Result Value Ref Range Status   Specimen Description BLOOD LEFT HAND  Final   Special Requests   Final    BOTTLES DRAWN AEROBIC AND ANAEROBIC Blood Culture adequate volume   Culture   Final    NO GROWTH <12 HOURS Performed at [facility], [address] [place], [place] [address]    Report Status PENDING  Incomplete    Pertinent Lab.    Latest Ref Rng & Units 05/31/2023    6:17 AM 05/30/2023   12:14 AM 05/29/2023    4:01 PM  CBC  WBC 4.0 - 10.5 K/uL 5.8  10.6  10.2   Hemoglobin 13.0 - 17.0 g/dL 69.6  29.5  28.4   Hematocrit 39.0 - 52.0 % 38.1  39.2  44.3   Platelets 150 - 400 K/uL 246  357  434       Latest  Ref Rng & Units 05/31/2023    6:17 AM 05/30/2023   12:14 AM 05/29/2023    4:01 PM  CMP  Glucose 70 - 99 mg/dL 97  782  956   BUN 6 - 20 mg/dL 12  10  9    Creatinine 0.61 - 1.24 mg/dL 2.13  0.86  5.78   Sodium 135 - 145 mmol/L 133  136  133   Potassium 3.5 - 5.1 mmol/L 4.1  3.6  3.8   Chloride 98 - 111 mmol/L 103  104  99   CO2 22 - 32 mmol/L 20  22  22    Calcium 8.9 - 10.3 mg/dL 8.5  8.2  8.7    Pertinent Imaging today Plain films and CT images have been personally visualized and interpreted; radiology reports have been reviewed. Decision making incorporated into the Impression  ECHOCARDIOGRAM COMPLETE  Result Date: 05/31/2023    ECHOCARDIOGRAM REPORT   Patient Name:   Eugene Diaz Date of Exam: 05/31/2023 Medical Rec #:  [id number]       Height:       74.0 in Accession #:    [id number]      Weight:       239.4 lb Date of Birth:  February 22, 1966       BSA:          2.346 m Patient Age:    57 years        BP:           110/65 mmHg Patient Gender: M               HR:           82 bpm. Exam Location:  Inpatient Procedure: 2D Echo, Cardiac Doppler and Color Doppler Indications:    Bacteremia  History:        Patient has prior history of Echocardiogram examinations, most                 recent 03/03/2022. CAD, PAD, Signs/Symptoms:Fever and Edema; Risk                 Factors:Diabetes, Dyslipidemia, Hypertension, Current Smoker and                 alcohol abuse, cellulitis. Sepsis, recent surgery.  Sonographer:    Wallie Char Referring Phys: [id number] [facility]  Sonographer Comments: Technically difficult study due to poor echo windows. Image acquisition challenging due to respiratory motion.  IMPRESSIONS  1. Left ventricular ejection fraction, by estimation, is 60 to 65%. The left ventricle has normal function. The left ventricle has no regional wall motion abnormalities. Left ventricular diastolic parameters are indeterminate.  2. Right ventricular systolic function is normal. The right ventricular size is normal.  3. The mitral valve is normal in structure. No evidence of mitral valve regurgitation. No evidence of mitral stenosis.  4. The aortic valve is normal in structure. There is moderate calcification of the aortic valve. There is moderate thickening of the aortic valve. Aortic valve regurgitation is not visualized. Aortic valve sclerosis is present, with no evidence of aortic valve stenosis.  5. The inferior vena cava is normal in size with greater than 50% respiratory variability, suggesting right atrial pressure of 3 mmHg. Conclusion(s)/Recommendation(s): No evidence of valvular vegetations on this transthoracic echocardiogram. Consider a transesophageal echocardiogram to exclude infective endocarditis if clinically indicated. FINDINGS  Left Ventricle: Left ventricular ejection fraction, by estimation, is 60 to 65%. The left ventricle has normal function. The  left ventricle has no regional wall motion abnormalities. The left ventricular internal cavity size was normal in size. There is  no left ventricular hypertrophy. Left ventricular diastolic parameters are indeterminate. Right Ventricle: The right ventricular size is normal. No increase in right ventricular wall thickness. Right ventricular systolic function is normal. Left Atrium: Left atrial size was normal in size. Right Atrium: Right atrial size was normal in size. Pericardium: There is no evidence of pericardial effusion. Mitral Valve: The mitral valve is normal in structure. No evidence of mitral valve regurgitation. No evidence of mitral valve stenosis. MV peak gradient, 3.4 mmHg. The mean mitral valve gradient is 2.0 mmHg. Tricuspid  Valve: The tricuspid valve is normal in structure. Tricuspid valve regurgitation is not demonstrated. No evidence of tricuspid stenosis. Aortic Valve: The aortic valve is normal in structure. There is moderate calcification of the aortic valve. There is moderate thickening of the aortic valve. Aortic valve regurgitation is not visualized. Aortic valve sclerosis is present, with no evidence of aortic valve stenosis. Aortic valve mean gradient measures 5.5 mmHg. Aortic valve peak gradient measures 10.4 mmHg. Aortic valve area, by VTI measures 4.09 cm. Pulmonic Valve: The pulmonic valve was normal in structure. Pulmonic valve regurgitation is not visualized. No evidence of pulmonic stenosis. Aorta: The aortic root is normal in size and structure. Venous: The inferior vena cava is normal in size with greater than 50% respiratory variability, suggesting right atrial pressure of 3 mmHg. IAS/Shunts: No atrial level shunt detected by color flow Doppler.  LEFT VENTRICLE PLAX 2D LVIDd:         4.80 cm      Diastology LVIDs:         3.10 cm      LV e' medial:    8.90 cm/s LV PW:         1.10 cm      LV E/e' medial:  11.3 LV IVS:        1.10 cm      LV e' lateral:   10.80 cm/s LVOT diam:     2.60 cm      LV E/e' lateral: 9.4 LV SV:         131 LV SV Index:   56 LVOT Area:     5.31 cm  LV Volumes (MOD) LV vol d, MOD A2C: 120.0 ml LV vol d, MOD A4C: 122.0 ml LV vol s, MOD A2C: 44.9 ml LV vol s, MOD A4C: 46.3 ml LV SV MOD A2C:     75.1 ml LV SV MOD A4C:     122.0 ml LV SV MOD BP:      77.9 ml RIGHT VENTRICLE             IVC RV Basal diam:  3.30 cm     IVC diam: 1.90 cm RV S prime:     13.70 cm/s TAPSE (M-mode): 1.8 cm LEFT ATRIUM             Index        RIGHT ATRIUM           Index LA diam:        3.90 cm 1.66 cm/m   RA Area:     15.80 cm LA Vol (A2C):   39.1 ml 16.67 ml/m  RA Volume:   41.60 ml  17.73 ml/m LA Vol (A4C):   47.8 ml 20.37 ml/m LA Biplane Vol: 46.5 ml 19.82 ml/m  AORTIC VALVE AV Area (Vmax):  3.97 cm AV  Area (Vmean):   3.90 cm AV Area (VTI):     4.09 cm AV Vmax:           161.00 cm/s AV Vmean:          112.500 cm/s AV VTI:            0.322 m AV Peak Grad:      10.4 mmHg AV Mean Grad:      5.5 mmHg LVOT Vmax:         120.50 cm/s LVOT Vmean:        82.600 cm/s LVOT VTI:          0.248 m LVOT/AV VTI ratio: 0.77  AORTA Ao Root diam: 3.60 cm Ao Asc diam:  3.90 cm MITRAL VALVE                TRICUSPID VALVE MV Area (PHT): 3.53 cm     TR Peak grad:   12.1 mmHg MV Area VTI:   5.03 cm     TR Vmax:        174.00 cm/s MV Peak grad:  3.4 mmHg MV Mean grad:  2.0 mmHg     SHUNTS MV Vmax:       0.92 m/s     Systemic VTI:  0.25 m MV Vmean:      63.4 cm/s    Systemic Diam: 2.60 cm MV Decel Time: 215 msec MV E velocity: 101.00 cm/s MV A velocity: 82.10 cm/s MV E/A ratio:  1.23 Donato Schultz MD Electronically signed by Donato Schultz MD Signature Date/Time: 05/31/2023/11:32:14 AM    Final      I have personally spent 50  minutes involved in face-to-face and non-face-to-face activities for this patient on the day of the visit. Professional time spent includes the following activities: Preparing to see the patient (review of tests), Obtaining and/or reviewing separately obtained history (admission/discharge record), Performing a medically appropriate examination and/or evaluation , Ordering medications/tests/procedures, referring and communicating with other health care professionals, Documenting clinical information in the EMR, Independently interpreting results (not separately reported), Communicating results to the patient/family/caregiver, Counseling and educating the patient/family/caregiver and Care coordination (not separately reported).   Plan d/w requesting provider as well as ID pharm D  Note: This document was prepared using dragon voice recognition software and may include unintentional dictation errors.   Electronically signed by:   Odette Fraction, MD Infectious Disease Physician [facility]  for Infectious Disease Pager: [phone]

## 2023-05-31 NOTE — Plan of Care (Signed)

## 2023-05-31 NOTE — Progress Notes (Signed)
  Progress Note   Patient: Eugene Diaz UJW:[id number] DOB: [birth year] DOA: 05/29/2023     2 DOS: the patient was seen and examined on 05/31/2023       Brief hospital course: Mr. Viscardi is a 57 y.o. M with obesity, DM, HTN, left BKA and recent right Chopart amputation who presented with fever, chills and drainage/dehiscence of his recent surgical right foot wound.     Assessment and Plan: * Sepsis due to enterococcus and Proteus bacteremia due to osteomyelitis of the right foot [facility]) Presented with tachycardia, fever.  Found to have bacteremia with enterococcus and proteus.  ID consulted. See below re: ABIs - Continue vancomycin, Rocephin, Flagyl - Follow-up TTE - Appreciate ID recommendations - Discussed with podiatry again today, they recommend amputation and will contact orthopedics    Acute osteomyelitis of calcaneum (HCC) [name] normal last Dec, suspect this is a small vessel ulcer or neuropathic.  On high-dose opiates at home, for chronic pain, and remembers waking up intraoperatively several years ago when he had his left BKA. - See above  Obesity (BMI 30-39.9) BMI 30.7  Chronic pain - Continue home morphine  GAD (generalized anxiety disorder) - Continue Xanax  CAD in native artery - Continue Crestor  Essential hypertension BP controlled - Continue amlodipine  HLD (hyperlipidemia) - Continue Crestor  Type 2 diabetes mellitus with peripheral neuropathy (HCC) Glucose well controlled - Continue aspart - Continue amitriptyline for diabetes related neuropathy - Hold metformin          Subjective: Patient doing well, no fever, no confusion, no restlessness.  Podiatry will call orthopedics this morning.  ID were consulted yesterday and are planning a TTE.     Physical Exam: BP 110/65 (BP Location: Left Arm)   Pulse 78   Temp 98.2 F (36.8 C) (Oral)   Resp 17   Ht 6' 2.02" (1.88 m)   Wt 108.6 kg   SpO2 97%   BMI 30.73 kg/m   Adult male,  lying in bed, watching television eating breakfast RRR, soft systolic murmur, no peripheral edema, no JVD Respiratory normal, lungs clear without rales or wheezes Abdomen soft no tenderness palpation or guarding Attention normal, affect appropriate, judgment insight appear normal      Data Reviewed: Discussed with podiatry CBC shows hemoglobin and white count are normal Basic metabolic panel unremarkable  Family Communication: None    Disposition: Status is: Inpatient The patient presented with osteomyelitis of the calcaneus and dehiscence of his recent Chopart's amputation incision  Podiatry evaluated the patient and recommended amputation.  Subsequently found to have proteus bacteremia, ID consulted.  He will need continued IV antibiotics, TTE, and finalization of antibiotic duration/selection.  In addition he will need amputation, likely SNF rehab at discharge        Author: Alberteen Sam, MD 05/31/2023 8:14 AM  For on call review [email or web address].

## 2023-05-31 NOTE — Consult Note (Signed)
ORTHOPAEDIC CONSULTATION  REQUESTING PHYSICIAN: Alberteen Sam, *  Chief Complaint: Purulent drainage right midfoot amputation.  HPI: Eugene Diaz is a 57 y.o. male who presents with left below-knee amputation with purulent drainage from the right midfoot amputation.  Patient has undergone surgical intervention for foot salvage on the right.  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) 05/23/2022   Depression    Diabetic neuropathy (HCC)    Edema of both lower extremities    Essential tremor    neurologist-- dr patel--- due to alcohol abuse   GERD (gastroesophageal reflux disease)    History of cellulitis 2020   left lower leg;   recurrent 03-25-2020   History of esophageal stricture    s/p  dilatation 02-13-2018   History of osteomyelitis 2020   left great toe     Hypercholesterolemia    Hypertension    followed by pcp  (nuclear stress test 03-11-2014 low risk w/ no ischemia, ef 65%   Normocytic anemia    followed by pcp   (03-18-2020 had transfusion's 02/ 2021)   Osteomyelitis of great toe of right foot (HCC) 08/18/2021   Peripheral vascular disease (HCC)    Severe depression (HCC) 06/09/2021   Status post incision and drainage followed by Dr Samuella Cota and pcp   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   (03-17-2020  pt states most of incision is healed with exception an area that is still draining,  daily dressing change),  foot is red but not warm to the touch and has swelling but has improved)   Suicidal ideation 06/09/2021   Syncope and collapse 06/07/2022   Syncope, near 06/20/2022   Thrombocytopenia (HCC)    chronic   Type 2 diabetes mellitus (HCC)    followed by pcp---    (03-18-2020  pt stated does not check blood sugar)   Vaccine counseling  08/29/2022   Past Surgical History:  Procedure Laterality Date   AMPUTATION Left 10/16/2019   Procedure: Left partial second ray resection; placement of antibiotic beads;  Surgeon: Park Liter, DPM;  Location: [facility];  Service: Podiatry;  Laterality: Left;   AMPUTATION Left 11/13/2019   Procedure: Left Midfoot Amputation - Transmetatarsal vs. Lisfranc; Placement antibiotic beads;  Surgeon: Park Liter, DPM;  Location: [facility];  Service: Podiatry;  Laterality: Left;   AMPUTATION Left 12/21/2019   Procedure: Chopart Amputation left foot;  Surgeon: Park Liter, DPM;  [facility];  Service: Podiatry;  Laterality: Left;   AMPUTATION Left 06/24/2020   Procedure: LEFT BELOW KNEE AMPUTATION;  Surgeon: Nadara Mustard, MD;  Location: [facility] OR;  Service: Orthopedics;  Laterality: Left;   AMPUTATION Right 03/29/2021   Procedure: AMPUTATION RAY;  Surgeon: Park Liter, DPM;  [facility]: [facility];  Service: Podiatry;  Laterality: Right;   AMPUTATION Right 04/28/2023   Procedure: RIGHT PARTIAL FOOT AMPUTATION;  Surgeon: Felecia Shelling, DPM;  [facility]: [facility];  Service: Podiatry;  Laterality: Right;   AMPUTATION TOE Left 08/14/2019   Procedure: AMPUTATION GREAT TOE;  Surgeon: Park Liter, DPM;  [facility]: [facility];  Service: Podiatry;  Laterality: Left;   APPLICATION OF WOUND VAC Left 12/21/2019   Procedure: Application  Of Wound Vac;  Surgeon: Park Liter, DPM;  Location: [facility] OR;  Service: Podiatry;  Laterality: Left;   BIOPSY  02/13/2018   Procedure: BIOPSY;  Surgeon: West Bali, MD;  [facility];  Service: Endoscopy;;  transverse colon biopsy, gastric biopsy   BONE BIOPSY Right 04/28/2023   Procedure: BONE BIOPSY;  Surgeon: Felecia Shelling, DPM;  [facility];  Service: Podiatry;  Laterality: Right;   COLONOSCOPY WITH PROPOFOL N/A 02/13/2018   Procedure: COLONOSCOPY WITH PROPOFOL;  Surgeon: West Bali, MD;  [facility];  Service: Endoscopy;  Laterality: N/A;   11:15am   ESOPHAGOGASTRODUODENOSCOPY (EGD) WITH PROPOFOL N/A 02/13/2018   Procedure: ESOPHAGOGASTRODUODENOSCOPY (EGD) WITH PROPOFOL;  Surgeon: West Bali, MD;  [facility];  Service: Endoscopy;  Laterality: N/A;   I & D EXTREMITY Left 07/16/2019   Procedure: Insicion  AND DEBRIDEMENT EXTREMITY;  Surgeon: Park Liter, DPM;  [facility];  Service: Podiatry;  Laterality: Left;   I & D EXTREMITY Left 06/22/2020   Procedure: IRRIGATION AND DEBRIDEMENT EXTREMITY, left foot and ankle;  Surgeon: Park Liter, DPM;  [facility];  Service: Podiatry;  Laterality: Left;   I & D EXTREMITY Right 03/29/2021   Procedure: IRRIGATION AND DEBRIDEMENT EXTREMITY;  Surgeon: Park Liter, DPM;  [facility];  Service: Podiatry;  Laterality: Right;   INCISION AND DRAINAGE OF WOUND Left 10/13/2019   Procedure: IRRIGATION AND DEBRIDEMENT WOUND OF LEFT FOOT AND FIRST METATARSAL RESECTION;  Surgeon: Park Liter, DPM;  [facility]: [facility];  Service: Podiatry;  Laterality: Left;   IRRIGATION AND DEBRIDEMENT FOOT Left 11/11/2019   Procedure: Left Foot Wound Irrigation and Debridement;  Surgeon: Park Liter, DPM;  Location: [facility];  Service: Podiatry;  Laterality: Left;   Left arm     Left arm repair (tendon and artery)   PERCUTANEOUS PINNING  07/16/2019   Procedure: Open Reduction Percutaneous Pinning Extremity;  Surgeon: Park Liter, DPM;  [facility];  Service: Podiatry;;   PILONIDAL CYST EXCISION N/A 08/08/2014   Procedure: CYST EXCISION PILONIDAL EXTENSIVE;  Surgeon: Dalia Heading, MD;  [facility]: [facility];  Service: General;  Laterality: N/A;   POLYPECTOMY  02/13/2018   Procedure: POLYPECTOMY;  Surgeon: West Bali, MD;  [facility];  Service: Endoscopy;;  transverse colon polyp hs, rectal polyps times 2   SAVORY DILATION N/A 02/13/2018   Procedure: SAVORY DILATION;  Surgeon: West Bali, MD;  [facility];  Service: Endoscopy;  Laterality:  N/A;   TENDON LENGTHENING Right 12/04/2021   Procedure: TENDON LENGTHENING;  Surgeon: Asencion Islam, DPM;  [facility];  Service: Podiatry;  Laterality: Right;   TRANSMETATARSAL AMPUTATION Right 12/04/2021   Procedure: TRANSMETATARSAL AMPUTATION;  Surgeon: Asencion Islam, DPM;  [facility];  Service: Podiatry;  Laterality: Right;   WOUND DEBRIDEMENT Left 09/04/2019   Procedure: DEBRIDEMENT WOUND WITH COMPLEX  REPAIR OF DEHISCENCE;  Surgeon: Park Liter, DPM;  Location: [facility];  Service: Podiatry;  Laterality: Left;  Leave patient on stretcher   WOUND DEBRIDEMENT Left 10/16/2019   Procedure: Left foot wound debridement and closure;  Surgeon: Park Liter, DPM;  Location: [facility];  Service: Podiatry;  Laterality: Left;   WOUND DEBRIDEMENT Left 12/18/2019   Procedure: LEFT FOOT DEBRIDEMENT WITH PARTIAL INCISION OF INFECTED BONE;  Surgeon: Park Liter, DPM;  [facility];  Service: Podiatry;  Laterality: Left;  LEFT FOOT DEBRIDEMENT WITH PARTIAL INCISION OF INFECTED BONE   WOUND DEBRIDEMENT Right 04/01/2021   Procedure: Right foot wound debridement and irrigation and closure, bone biopsy;  Surgeon: Park Liter, DPM;  [facility];  Service: Podiatry;  Laterality: Right;   Social History   Socioeconomic History   Marital status: Widowed    Spouse name: Not on file   Number of children: 0   Years of education: Not on file   Highest education level: 8th grade  Occupational History   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  Tobacco Use   Smoking status: Every Day    Types: Cigars   Smokeless tobacco: Never   Tobacco comments:    Quit smoking cigarettes; now smokes cigars, wants to quit  Vaping Use   Vaping status: Never Used  Substance and Sexual Activity   Alcohol use: Yes    Alcohol/week: 50.0 standard drinks of alcohol    Types: 50 Standard drinks or equivalent per week    Comment: Drinks 6 beers, liquor  4-6oz (both daily) x 35 years; now states he "drinks a few beers and let it go"   Drug use: Not Currently    Types: Marijuana   Sexual activity: Yes    Birth control/protection: None  Other Topics Concern   Not on file  Social History Narrative   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 Highest level of education:  8th grade   He lives with wife.  No children.   Left handed   Drinks soda, water-no coffee, no tea   Social Determinants of Health   Financial Resource Strain: Not on file  Food Insecurity: No Food Insecurity (05/29/2023)   Hunger Vital Sign    Worried About Running Out of Food in the Last Year: Never true    Ran Out of Food in the Last Year: Never true  Transportation Needs: No Transportation Needs (05/29/2023)   PRAPARE - Administrator, Civil Service (Medical): No    Lack of Transportation (Non-Medical): No  Physical Activity: Not on file  Stress: Not on file  Social Connections: Not on file   Family History  Problem Relation Age of Onset   Heart attack Mother        Living, 52   Cancer Father        Deceased   Healthy Brother    Healthy Sister    Colon cancer Neg Hx    Colon polyps Neg Hx    - negative except otherwise stated in the family history section No Known Allergies Prior to Admission medications   Medication Sig Start Date End Date Taking? Authorizing Provider  acetaminophen (TYLENOL) 325 MG tablet Take 2 tablets (650 mg total) by mouth every 6 (six) hours as needed for mild pain. 03/03/22  Yes Hongalgi, Maximino Greenland, MD  albuterol (VENTOLIN HFA) 108 (90 Base) MCG/ACT inhaler Inhale 1-2 puffs into the lungs every 4 (four) hours as needed for wheezing or shortness of breath. 05/11/22  Yes [provider]  ALPRAZolam Prudy Feeler) 1 MG tablet Take 1 mg by mouth 4 (four) times daily. 05/14/22  Yes [provider]  amitriptyline (ELAVIL) 100 MG tablet Take 100 mg by mouth at bedtime. 09/15/22  Yes  [provider]  amLODipine (NORVASC) 10 MG tablet Take 1 tablet (10 mg total) by mouth daily. 02/15/23  Yes Alver Sorrow, NP  diclofenac Sodium (VOLTAREN) 1 % GEL  Apply 4 g topically 4 (four) times daily as needed (Apply to painful area over left upper front chest.). 03/03/22  Yes Hongalgi, Maximino Greenland, MD  folic acid (FOLVITE) 1 MG tablet Take 1 tablet (1 mg total) by mouth daily. 03/03/22  Yes Hongalgi, Maximino Greenland, MD  metFORMIN (GLUCOPHAGE) 500 MG tablet Take 500 mg by mouth 2 (two) times daily. 05/11/22  Yes [provider]  morphine (MS CONTIN) 30 MG 12 hr tablet Take 30 mg by mouth every 12 (twelve) hours. 05/14/22  Yes [provider]  Multiple Vitamin (MULTIVITAMIN WITH MINERALS) TABS tablet Take 1 tablet by mouth daily. 10/14/22  Yes Glade Lloyd, MD  oxycodone (ROXICODONE) 30 MG immediate release tablet Take 30 mg by mouth 3 (three) times daily. 05/14/22  Yes [provider]  rosuvastatin (CRESTOR) 20 MG tablet Take 1 tablet (20 mg total) by mouth daily. 03/20/23  Yes Chilton Si, MD  senna-docusate (SENOKOT-S) 8.6-50 MG tablet Take 1 tablet by mouth at bedtime as needed for mild constipation. 05/01/23  Yes Marlin Canary U, DO  thiamine 100 MG tablet Take 1 tablet (100 mg total) by mouth daily. 03/04/22  Yes Hongalgi, Maximino Greenland, MD  amoxicillin-clavulanate (AUGMENTIN) 875-125 MG tablet Take 1 tablet by mouth every 12 (twelve) hours. Patient not taking: Reported on 05/29/2023 05/08/23   Felecia Shelling, DPM  doxycycline (VIBRA-TABS) 100 MG tablet Take 1 tablet (100 mg total) by mouth every 12 (twelve) hours. Patient not taking: Reported on 05/29/2023 05/01/23   Joseph Art, DO   ECHOCARDIOGRAM COMPLETE  Result Date: 05/31/2023    ECHOCARDIOGRAM REPORT   Patient Name:   Eugene Diaz Date of Exam: 05/31/2023 Medical Rec #:  [id number]       Height:       74.0 in Accession #:    [id number]      Weight:       239.4 lb Date of Birth:  06-20-1966       BSA:           2.346 m Patient Age:    57 years        BP:           110/65 mmHg Patient Gender: M               HR:           82 bpm. Exam Location:  Inpatient Procedure: 2D Echo, Cardiac Doppler and Color Doppler Indications:    Bacteremia  History:        Patient has prior history of Echocardiogram examinations, most                 recent 03/03/2022. CAD, PAD, Signs/Symptoms:Fever and Edema; Risk                 Factors:Diabetes, Dyslipidemia, Hypertension, Current Smoker and                 alcohol abuse, cellulitis. Sepsis, recent surgery.  Sonographer:    Wallie Char Referring Phys: [id number] [facility]  Sonographer Comments: Technically difficult study due to poor echo windows. Image acquisition challenging due to respiratory motion. IMPRESSIONS  1. Left ventricular ejection fraction, by estimation, is 60 to 65%. The left ventricle has normal function. The left ventricle has no regional wall motion abnormalities. Left ventricular diastolic parameters are indeterminate.  2. Right ventricular systolic function is normal. The right ventricular size is normal.  3. The mitral valve is normal in structure. No  evidence of mitral valve regurgitation. No evidence of mitral stenosis.  4. The aortic valve is normal in structure. There is moderate calcification of the aortic valve. There is moderate thickening of the aortic valve. Aortic valve regurgitation is not visualized. Aortic valve sclerosis is present, with no evidence of aortic valve stenosis.  5. The inferior vena cava is normal in size with greater than 50% respiratory variability, suggesting right atrial pressure of 3 mmHg. Conclusion(s)/Recommendation(s): No evidence of valvular vegetations on this transthoracic echocardiogram. Consider a transesophageal echocardiogram to exclude infective endocarditis if clinically indicated. FINDINGS  Left Ventricle: Left ventricular ejection fraction, by estimation, is 60 to 65%. The left ventricle has normal function. The left  ventricle has no regional wall motion abnormalities. The left ventricular internal cavity size was normal in size. There is  no left ventricular hypertrophy. Left ventricular diastolic parameters are indeterminate. Right Ventricle: The right ventricular size is normal. No increase in right ventricular wall thickness. Right ventricular systolic function is normal. Left Atrium: Left atrial size was normal in size. Right Atrium: Right atrial size was normal in size. Pericardium: There is no evidence of pericardial effusion. Mitral Valve: The mitral valve is normal in structure. No evidence of mitral valve regurgitation. No evidence of mitral valve stenosis. MV peak gradient, 3.4 mmHg. The mean mitral valve gradient is 2.0 mmHg. Tricuspid Valve: The tricuspid valve is normal in structure. Tricuspid valve regurgitation is not demonstrated. No evidence of tricuspid stenosis. Aortic Valve: The aortic valve is normal in structure. There is moderate calcification of the aortic valve. There is moderate thickening of the aortic valve. Aortic valve regurgitation is not visualized. Aortic valve sclerosis is present, with no evidence of aortic valve stenosis. Aortic valve mean gradient measures 5.5 mmHg. Aortic valve peak gradient measures 10.4 mmHg. Aortic valve area, by VTI measures 4.09 cm. Pulmonic Valve: The pulmonic valve was normal in structure. Pulmonic valve regurgitation is not visualized. No evidence of pulmonic stenosis. Aorta: The aortic root is normal in size and structure. Venous: The inferior vena cava is normal in size with greater than 50% respiratory variability, suggesting right atrial pressure of 3 mmHg. IAS/Shunts: No atrial level shunt detected by color flow Doppler.  LEFT VENTRICLE PLAX 2D LVIDd:         4.80 cm      Diastology LVIDs:         3.10 cm      LV e' medial:    8.90 cm/s LV PW:         1.10 cm      LV E/e' medial:  11.3 LV IVS:        1.10 cm      LV e' lateral:   10.80 cm/s LVOT diam:     2.60  cm      LV E/e' lateral: 9.4 LV SV:         131 LV SV Index:   56 LVOT Area:     5.31 cm  LV Volumes (MOD) LV vol d, MOD A2C: 120.0 ml LV vol d, MOD A4C: 122.0 ml LV vol s, MOD A2C: 44.9 ml LV vol s, MOD A4C: 46.3 ml LV SV MOD A2C:     75.1 ml LV SV MOD A4C:     122.0 ml LV SV MOD BP:      77.9 ml RIGHT VENTRICLE             IVC RV Basal diam:  3.30 cm  IVC diam: 1.90 cm RV S prime:     13.70 cm/s TAPSE (M-mode): 1.8 cm LEFT ATRIUM             Index        RIGHT ATRIUM           Index LA diam:        3.90 cm 1.66 cm/m   RA Area:     15.80 cm LA Vol (A2C):   39.1 ml 16.67 ml/m  RA Volume:   41.60 ml  17.73 ml/m LA Vol (A4C):   47.8 ml 20.37 ml/m LA Biplane Vol: 46.5 ml 19.82 ml/m  AORTIC VALVE AV Area (Vmax):    3.97 cm AV Area (Vmean):   3.90 cm AV Area (VTI):     4.09 cm AV Vmax:           161.00 cm/s AV Vmean:          112.500 cm/s AV VTI:            0.322 m AV Peak Grad:      10.4 mmHg AV Mean Grad:      5.5 mmHg LVOT Vmax:         120.50 cm/s LVOT Vmean:        82.600 cm/s LVOT VTI:          0.248 m LVOT/AV VTI ratio: 0.77  AORTA Ao Root diam: 3.60 cm Ao Asc diam:  3.90 cm MITRAL VALVE                TRICUSPID VALVE MV Area (PHT): 3.53 cm     TR Peak grad:   12.1 mmHg MV Area VTI:   5.03 cm     TR Vmax:        174.00 cm/s MV Peak grad:  3.4 mmHg MV Mean grad:  2.0 mmHg     SHUNTS MV Vmax:       0.92 m/s     Systemic VTI:  0.25 m MV Vmean:      63.4 cm/s    Systemic Diam: 2.60 cm MV Decel Time: 215 msec MV E velocity: 101.00 cm/s MV A velocity: 82.10 cm/s MV E/A ratio:  1.23 Donato Schultz MD Electronically signed by Donato Schultz MD Signature Date/Time: 05/31/2023/11:32:14 AM    Final    DG Tibia/Fibula Right  Result Date: 05/29/2023 CLINICAL DATA:  Right hindfoot amputation, drainage from surgical wound EXAM: RIGHT TIBIA AND FIBULA - 2 VIEW COMPARISON:  04/28/2023 FINDINGS: Frontal and lateral views of the right tibia and fibula are obtained. Postsurgical changes are seen from amputation at the  level of the right hindfoot. Resolution of subcutaneous gas at the surgical site. There is increased soft tissue swelling at the amputation margins. Skin staples are again identified. Cortical irregularity along the distal margin of the calcaneus has developed since prior exam, and developing osteomyelitis is a concern. The right tibia and fibula are unremarkable. The knee and ankle are in anatomic alignment. There is diffuse subcutaneous edema throughout the right lower leg. IMPRESSION: 1. Findings concerning for developing osteomyelitis of the distal margin of the calcaneus along the prior hindfoot amputation site. 2. Diffuse soft tissue swelling throughout the right lower leg and foot. Electronically Signed   By: Sharlet Salina M.D.   On: 05/29/2023 17:56   - pertinent xrays, CT, MRI studies were reviewed and independently interpreted  Positive ROS: All other systems have been reviewed and were otherwise negative with the exception of  those mentioned in the HPI and as above.  Physical Exam: General: Alert, no acute distress Psychiatric: Patient is competent for consent with normal mood and affect Lymphatic: No axillary or cervical lymphadenopathy Cardiovascular: No pedal edema Respiratory: No cyanosis, no use of accessory musculature GI: No organomegaly, abdomen is soft and non-tender    Images:  @ENCIMAGES @  Labs:  Lab Results  Component Value Date   HGBA1C 5.6 04/27/2023   HGBA1C 6.1 (H) 10/12/2022   HGBA1C 5.1 05/20/2022   ESRSEDRATE 9 04/24/2023   ESRSEDRATE 106 (H) 02/20/2023   ESRSEDRATE 6 11/21/2022   CRP 5.6 04/24/2023   CRP 50.4 (H) 02/20/2023   CRP 22.1 (H) 11/21/2022   REPTSTATUS PENDING 05/31/2023   REPTSTATUS PENDING 05/31/2023   GRAMSTAIN NO WBC SEEN NO ORGANISMS SEEN  04/28/2023   CULT  05/31/2023    NO GROWTH <12 HOURS Performed at [facility], [address] [place], [place] [address]    CULT  05/31/2023    NO GROWTH <12 HOURS Performed at [facility], [address] [place], [place] [address]    [facility] ESCHERICHIA COLI 03/29/2021    Lab Results  Component Value Date   ALBUMIN 3.7 04/27/2023   ALBUMIN 3.1 (L) 10/13/2022   ALBUMIN 4.4 08/31/2022   PREALBUMIN 14 (L) 05/20/2022   PREALBUMIN 14.0 (L) 10/11/2019        Latest Ref Rng & Units 05/31/2023    6:17 AM 05/30/2023   12:14 AM 05/29/2023    4:01 PM  CBC EXTENDED  WBC 4.0 - 10.5 K/uL 5.8  10.6  10.2   RBC 4.22 - 5.81 MIL/uL 4.18  4.27  4.73   Hemoglobin 13.0 - 17.0 g/dL 82.9  56.2  13.0   HCT 39.0 - 52.0 % 38.1  39.2  44.3   Platelets 150 - 400 K/uL 246  357  434   NEUT# 1.7 - 7.7 K/uL   6.6   Lymph# 0.7 - 4.0 K/uL   2.5     Neurologic: Patient does not have protective sensation bilateral lower extremities.   MUSCULOSKELETAL:   Skin: Examination there is purulent drainage from the right midfoot amputation laterally.  There is induration of the skin.  Review of the MRI scan shows osteomyelitis involving the forefoot.  There is also edema at the insertion of the Achilles.  White cell count 5.8 with a hemoglobin of 12.7.  Assessment: Assessment: Osteomyelitis and abscess right midfoot.  Plan: Recommended proceeding with a transtibial amputation.  Patient states he understands risk and benefits and would like to proceed with surgery.  The hospital does not have operating room time available on Friday would anticipate surgery on Saturday.  Thank you for the consult and the opportunity to see Mr. Johnathin Reinisch, MD [facility] [phone] 4:19 PM

## 2023-05-31 NOTE — H&P (View-Only) (Signed)
ORTHOPAEDIC CONSULTATION  REQUESTING PHYSICIAN: Alberteen Sam, *  Chief Complaint: Purulent drainage right midfoot amputation.  HPI: Eugene Diaz is a 57 y.o. male who presents with left below-knee amputation with purulent drainage from the right midfoot amputation.  Patient has undergone surgical intervention for foot salvage on the right.  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) 05/23/2022   Depression    Diabetic neuropathy (HCC)    Edema of both lower extremities    Essential tremor    neurologist-- dr patel--- due to alcohol abuse   GERD (gastroesophageal reflux disease)    History of cellulitis 2020   left lower leg;   recurrent 03-25-2020   History of esophageal stricture    s/p  dilatation 02-13-2018   History of osteomyelitis 2020   left great toe     Hypercholesterolemia    Hypertension    followed by pcp  (nuclear stress test 03-11-2014 low risk w/ no ischemia, ef 65%   Normocytic anemia    followed by pcp   (03-18-2020 had transfusion's 02/ 2021)   Osteomyelitis of great toe of right foot (HCC) 08/18/2021   Peripheral vascular disease (HCC)    Severe depression (HCC) 06/09/2021   Status post incision and drainage followed by Dr Samuella Cota and pcp   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   (03-17-2020  pt states most of incision is healed with exception an area that is still draining,  daily dressing change),  foot is red but not warm to the touch and has swelling but has improved)   Suicidal ideation 06/09/2021   Syncope and collapse 06/07/2022   Syncope, near 06/20/2022   Thrombocytopenia (HCC)    chronic   Type 2 diabetes mellitus (HCC)    followed by pcp---    (03-18-2020  pt stated does not check blood sugar)   Vaccine counseling  08/29/2022   Past Surgical History:  Procedure Laterality Date   AMPUTATION Left 10/16/2019   Procedure: Left partial second ray resection; placement of antibiotic beads;  Surgeon: Park Liter, DPM;  Location: [facility];  Service: Podiatry;  Laterality: Left;   AMPUTATION Left 11/13/2019   Procedure: Left Midfoot Amputation - Transmetatarsal vs. Lisfranc; Placement antibiotic beads;  Surgeon: Park Liter, DPM;  Location: [facility];  Service: Podiatry;  Laterality: Left;   AMPUTATION Left 12/21/2019   Procedure: Chopart Amputation left foot;  Surgeon: Park Liter, DPM;  [facility];  Service: Podiatry;  Laterality: Left;   AMPUTATION Left 06/24/2020   Procedure: LEFT BELOW KNEE AMPUTATION;  Surgeon: Nadara Mustard, MD;  Location: [facility] OR;  Service: Orthopedics;  Laterality: Left;   AMPUTATION Right 03/29/2021   Procedure: AMPUTATION RAY;  Surgeon: Park Liter, DPM;  [facility]: [facility];  Service: Podiatry;  Laterality: Right;   AMPUTATION Right 04/28/2023   Procedure: RIGHT PARTIAL FOOT AMPUTATION;  Surgeon: Felecia Shelling, DPM;  [facility]: [facility];  Service: Podiatry;  Laterality: Right;   AMPUTATION TOE Left 08/14/2019   Procedure: AMPUTATION GREAT TOE;  Surgeon: Park Liter, DPM;  Location: [facility];  Service: Podiatry;  Laterality: Left;   APPLICATION OF WOUND VAC Left 12/21/2019   Procedure: Application  Of Wound Vac;  Surgeon: Park Liter, DPM;  Location: [facility] OR;  Service: Podiatry;  Laterality: Left;   BIOPSY  02/13/2018   Procedure: BIOPSY;  Surgeon: West Bali, MD;  [facility];  Service: Endoscopy;;  transverse colon biopsy, gastric biopsy   BONE BIOPSY Right 04/28/2023   Procedure: BONE BIOPSY;  Surgeon: Felecia Shelling, DPM;  [facility];  Service: Podiatry;  Laterality: Right;   COLONOSCOPY WITH PROPOFOL N/A 02/13/2018   Procedure: COLONOSCOPY WITH PROPOFOL;  Surgeon: West Bali, MD;  [facility];  Service: Endoscopy;  Laterality: N/A;   11:15am   ESOPHAGOGASTRODUODENOSCOPY (EGD) WITH PROPOFOL N/A 02/13/2018   Procedure: ESOPHAGOGASTRODUODENOSCOPY (EGD) WITH PROPOFOL;  Surgeon: West Bali, MD;  [facility];  Service: Endoscopy;  Laterality: N/A;   I & D EXTREMITY Left 07/16/2019   Procedure: Insicion  AND DEBRIDEMENT EXTREMITY;  Surgeon: Park Liter, DPM;  [facility];  Service: Podiatry;  Laterality: Left;   I & D EXTREMITY Left 06/22/2020   Procedure: IRRIGATION AND DEBRIDEMENT EXTREMITY, left foot and ankle;  Surgeon: Park Liter, DPM;  [facility];  Service: Podiatry;  Laterality: Left;   I & D EXTREMITY Right 03/29/2021   Procedure: IRRIGATION AND DEBRIDEMENT EXTREMITY;  Surgeon: Park Liter, DPM;  [facility];  Service: Podiatry;  Laterality: Right;   INCISION AND DRAINAGE OF WOUND Left 10/13/2019   Procedure: IRRIGATION AND DEBRIDEMENT WOUND OF LEFT FOOT AND FIRST METATARSAL RESECTION;  Surgeon: Park Liter, DPM;  [facility]: [facility];  Service: Podiatry;  Laterality: Left;   IRRIGATION AND DEBRIDEMENT FOOT Left 11/11/2019   Procedure: Left Foot Wound Irrigation and Debridement;  Surgeon: Park Liter, DPM;  Location: [facility];  Service: Podiatry;  Laterality: Left;   Left arm     Left arm repair (tendon and artery)   PERCUTANEOUS PINNING  07/16/2019   Procedure: Open Reduction Percutaneous Pinning Extremity;  Surgeon: Park Liter, DPM;  [facility];  Service: Podiatry;;   PILONIDAL CYST EXCISION N/A 08/08/2014   Procedure: CYST EXCISION PILONIDAL EXTENSIVE;  Surgeon: Dalia Heading, MD;  [facility]: [facility];  Service: General;  Laterality: N/A;   POLYPECTOMY  02/13/2018   Procedure: POLYPECTOMY;  Surgeon: West Bali, MD;  [facility];  Service: Endoscopy;;  transverse colon polyp hs, rectal polyps times 2   SAVORY DILATION N/A 02/13/2018   Procedure: SAVORY DILATION;  Surgeon: West Bali, MD;  [facility];  Service: Endoscopy;  Laterality:  N/A;   TENDON LENGTHENING Right 12/04/2021   Procedure: TENDON LENGTHENING;  Surgeon: Asencion Islam, DPM;  [facility];  Service: Podiatry;  Laterality: Right;   TRANSMETATARSAL AMPUTATION Right 12/04/2021   Procedure: TRANSMETATARSAL AMPUTATION;  Surgeon: Asencion Islam, DPM;  [facility];  Service: Podiatry;  Laterality: Right;   WOUND DEBRIDEMENT Left 09/04/2019   Procedure: DEBRIDEMENT WOUND WITH COMPLEX  REPAIR OF DEHISCENCE;  Surgeon: Park Liter, DPM;  Location: [facility];  Service: Podiatry;  Laterality: Left;  Leave patient on stretcher   WOUND DEBRIDEMENT Left 10/16/2019   Procedure: Left foot wound debridement and closure;  Surgeon: Park Liter, DPM;  Location: [facility];  Service: Podiatry;  Laterality: Left;   WOUND DEBRIDEMENT Left 12/18/2019   Procedure: LEFT FOOT DEBRIDEMENT WITH PARTIAL INCISION OF INFECTED BONE;  Surgeon: Park Liter, DPM;  [facility];  Service: Podiatry;  Laterality: Left;  LEFT FOOT DEBRIDEMENT WITH PARTIAL INCISION OF INFECTED BONE   WOUND DEBRIDEMENT Right 04/01/2021   Procedure: Right foot wound debridement and irrigation and closure, bone biopsy;  Surgeon: Park Liter, DPM;  [facility];  Service: Podiatry;  Laterality: Right;   Social History   Socioeconomic History   Marital status: Widowed    Spouse name: Not on file   Number of children: 0   Years of education: Not on file   Highest education level: 8th grade  Occupational History   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  Tobacco Use   Smoking status: Every Day    Types: Cigars   Smokeless tobacco: Never   Tobacco comments:    Quit smoking cigarettes; now smokes cigars, wants to quit  Vaping Use   Vaping status: Never Used  Substance and Sexual Activity   Alcohol use: Yes    Alcohol/week: 50.0 standard drinks of alcohol    Types: 50 Standard drinks or equivalent per week    Comment: Drinks 6 beers, liquor  4-6oz (both daily) x 35 years; now states he "drinks a few beers and let it go"   Drug use: Not Currently    Types: Marijuana   Sexual activity: Yes    Birth control/protection: None  Other Topics Concern   Not on file  Social History Narrative   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 Highest level of education:  8th grade   He lives with wife.  No children.   Left handed   Drinks soda, water-no coffee, no tea   Social Determinants of Health   Financial Resource Strain: Not on file  Food Insecurity: No Food Insecurity (05/29/2023)   Hunger Vital Sign    Worried About Running Out of Food in the Last Year: Never true    Ran Out of Food in the Last Year: Never true  Transportation Needs: No Transportation Needs (05/29/2023)   PRAPARE - Administrator, Civil Service (Medical): No    Lack of Transportation (Non-Medical): No  Physical Activity: Not on file  Stress: Not on file  Social Connections: Not on file   Family History  Problem Relation Age of Onset   Heart attack Mother        Living, 52   Cancer Father        Deceased   Healthy Brother    Healthy Sister    Colon cancer Neg Hx    Colon polyps Neg Hx    - negative except otherwise stated in the family history section No Known Allergies Prior to Admission medications   Medication Sig Start Date End Date Taking? Authorizing Provider  acetaminophen (TYLENOL) 325 MG tablet Take 2 tablets (650 mg total) by mouth every 6 (six) hours as needed for mild pain. 03/03/22  Yes Hongalgi, Maximino Greenland, MD  albuterol (VENTOLIN HFA) 108 (90 Base) MCG/ACT inhaler Inhale 1-2 puffs into the lungs every 4 (four) hours as needed for wheezing or shortness of breath. 05/11/22  Yes [provider]  ALPRAZolam Prudy Feeler) 1 MG tablet Take 1 mg by mouth 4 (four) times daily. 05/14/22  Yes [provider]  amitriptyline (ELAVIL) 100 MG tablet Take 100 mg by mouth at bedtime. 09/15/22  Yes  [provider]  amLODipine (NORVASC) 10 MG tablet Take 1 tablet (10 mg total) by mouth daily. 02/15/23  Yes Alver Sorrow, NP  diclofenac Sodium (VOLTAREN) 1 % GEL  Apply 4 g topically 4 (four) times daily as needed (Apply to painful area over left upper front chest.). 03/03/22  Yes Hongalgi, Maximino Greenland, MD  folic acid (FOLVITE) 1 MG tablet Take 1 tablet (1 mg total) by mouth daily. 03/03/22  Yes Hongalgi, Maximino Greenland, MD  metFORMIN (GLUCOPHAGE) 500 MG tablet Take 500 mg by mouth 2 (two) times daily. 05/11/22  Yes [provider]  morphine (MS CONTIN) 30 MG 12 hr tablet Take 30 mg by mouth every 12 (twelve) hours. 05/14/22  Yes [provider]  Multiple Vitamin (MULTIVITAMIN WITH MINERALS) TABS tablet Take 1 tablet by mouth daily. 10/14/22  Yes Glade Lloyd, MD  oxycodone (ROXICODONE) 30 MG immediate release tablet Take 30 mg by mouth 3 (three) times daily. 05/14/22  Yes [provider]  rosuvastatin (CRESTOR) 20 MG tablet Take 1 tablet (20 mg total) by mouth daily. 03/20/23  Yes Chilton Si, MD  senna-docusate (SENOKOT-S) 8.6-50 MG tablet Take 1 tablet by mouth at bedtime as needed for mild constipation. 05/01/23  Yes Marlin Canary U, DO  thiamine 100 MG tablet Take 1 tablet (100 mg total) by mouth daily. 03/04/22  Yes Hongalgi, Maximino Greenland, MD  amoxicillin-clavulanate (AUGMENTIN) 875-125 MG tablet Take 1 tablet by mouth every 12 (twelve) hours. Patient not taking: Reported on 05/29/2023 05/08/23   Felecia Shelling, DPM  doxycycline (VIBRA-TABS) 100 MG tablet Take 1 tablet (100 mg total) by mouth every 12 (twelve) hours. Patient not taking: Reported on 05/29/2023 05/01/23   Joseph Art, DO   ECHOCARDIOGRAM COMPLETE  Result Date: 05/31/2023    ECHOCARDIOGRAM REPORT   Patient Name:   Eugene Diaz Date of Exam: 05/31/2023 Medical Rec #:  [id number]       Height:       74.0 in Accession #:    [id number]      Weight:       239.4 lb Date of Birth:  06-20-1966       BSA:           2.346 m Patient Age:    57 years        BP:           110/65 mmHg Patient Gender: M               HR:           82 bpm. Exam Location:  Inpatient Procedure: 2D Echo, Cardiac Doppler and Color Doppler Indications:    Bacteremia  History:        Patient has prior history of Echocardiogram examinations, most                 recent 03/03/2022. CAD, PAD, Signs/Symptoms:Fever and Edema; Risk                 Factors:Diabetes, Dyslipidemia, Hypertension, Current Smoker and                 alcohol abuse, cellulitis. Sepsis, recent surgery.  Sonographer:    Wallie Char Referring Phys: [id number] [facility]  Sonographer Comments: Technically difficult study due to poor echo windows. Image acquisition challenging due to respiratory motion. IMPRESSIONS  1. Left ventricular ejection fraction, by estimation, is 60 to 65%. The left ventricle has normal function. The left ventricle has no regional wall motion abnormalities. Left ventricular diastolic parameters are indeterminate.  2. Right ventricular systolic function is normal. The right ventricular size is normal.  3. The mitral valve is normal in structure. No  evidence of mitral valve regurgitation. No evidence of mitral stenosis.  4. The aortic valve is normal in structure. There is moderate calcification of the aortic valve. There is moderate thickening of the aortic valve. Aortic valve regurgitation is not visualized. Aortic valve sclerosis is present, with no evidence of aortic valve stenosis.  5. The inferior vena cava is normal in size with greater than 50% respiratory variability, suggesting right atrial pressure of 3 mmHg. Conclusion(s)/Recommendation(s): No evidence of valvular vegetations on this transthoracic echocardiogram. Consider a transesophageal echocardiogram to exclude infective endocarditis if clinically indicated. FINDINGS  Left Ventricle: Left ventricular ejection fraction, by estimation, is 60 to 65%. The left ventricle has normal function. The left  ventricle has no regional wall motion abnormalities. The left ventricular internal cavity size was normal in size. There is  no left ventricular hypertrophy. Left ventricular diastolic parameters are indeterminate. Right Ventricle: The right ventricular size is normal. No increase in right ventricular wall thickness. Right ventricular systolic function is normal. Left Atrium: Left atrial size was normal in size. Right Atrium: Right atrial size was normal in size. Pericardium: There is no evidence of pericardial effusion. Mitral Valve: The mitral valve is normal in structure. No evidence of mitral valve regurgitation. No evidence of mitral valve stenosis. MV peak gradient, 3.4 mmHg. The mean mitral valve gradient is 2.0 mmHg. Tricuspid Valve: The tricuspid valve is normal in structure. Tricuspid valve regurgitation is not demonstrated. No evidence of tricuspid stenosis. Aortic Valve: The aortic valve is normal in structure. There is moderate calcification of the aortic valve. There is moderate thickening of the aortic valve. Aortic valve regurgitation is not visualized. Aortic valve sclerosis is present, with no evidence of aortic valve stenosis. Aortic valve mean gradient measures 5.5 mmHg. Aortic valve peak gradient measures 10.4 mmHg. Aortic valve area, by VTI measures 4.09 cm. Pulmonic Valve: The pulmonic valve was normal in structure. Pulmonic valve regurgitation is not visualized. No evidence of pulmonic stenosis. Aorta: The aortic root is normal in size and structure. Venous: The inferior vena cava is normal in size with greater than 50% respiratory variability, suggesting right atrial pressure of 3 mmHg. IAS/Shunts: No atrial level shunt detected by color flow Doppler.  LEFT VENTRICLE PLAX 2D LVIDd:         4.80 cm      Diastology LVIDs:         3.10 cm      LV e' medial:    8.90 cm/s LV PW:         1.10 cm      LV E/e' medial:  11.3 LV IVS:        1.10 cm      LV e' lateral:   10.80 cm/s LVOT diam:     2.60  cm      LV E/e' lateral: 9.4 LV SV:         131 LV SV Index:   56 LVOT Area:     5.31 cm  LV Volumes (MOD) LV vol d, MOD A2C: 120.0 ml LV vol d, MOD A4C: 122.0 ml LV vol s, MOD A2C: 44.9 ml LV vol s, MOD A4C: 46.3 ml LV SV MOD A2C:     75.1 ml LV SV MOD A4C:     122.0 ml LV SV MOD BP:      77.9 ml RIGHT VENTRICLE             IVC RV Basal diam:  3.30 cm  IVC diam: 1.90 cm RV S prime:     13.70 cm/s TAPSE (M-mode): 1.8 cm LEFT ATRIUM             Index        RIGHT ATRIUM           Index LA diam:        3.90 cm 1.66 cm/m   RA Area:     15.80 cm LA Vol (A2C):   39.1 ml 16.67 ml/m  RA Volume:   41.60 ml  17.73 ml/m LA Vol (A4C):   47.8 ml 20.37 ml/m LA Biplane Vol: 46.5 ml 19.82 ml/m  AORTIC VALVE AV Area (Vmax):    3.97 cm AV Area (Vmean):   3.90 cm AV Area (VTI):     4.09 cm AV Vmax:           161.00 cm/s AV Vmean:          112.500 cm/s AV VTI:            0.322 m AV Peak Grad:      10.4 mmHg AV Mean Grad:      5.5 mmHg LVOT Vmax:         120.50 cm/s LVOT Vmean:        82.600 cm/s LVOT VTI:          0.248 m LVOT/AV VTI ratio: 0.77  AORTA Ao Root diam: 3.60 cm Ao Asc diam:  3.90 cm MITRAL VALVE                TRICUSPID VALVE MV Area (PHT): 3.53 cm     TR Peak grad:   12.1 mmHg MV Area VTI:   5.03 cm     TR Vmax:        174.00 cm/s MV Peak grad:  3.4 mmHg MV Mean grad:  2.0 mmHg     SHUNTS MV Vmax:       0.92 m/s     Systemic VTI:  0.25 m MV Vmean:      63.4 cm/s    Systemic Diam: 2.60 cm MV Decel Time: 215 msec MV E velocity: 101.00 cm/s MV A velocity: 82.10 cm/s MV E/A ratio:  1.23 Donato Schultz MD Electronically signed by Donato Schultz MD Signature Date/Time: 05/31/2023/11:32:14 AM    Final    DG Tibia/Fibula Right  Result Date: 05/29/2023 CLINICAL DATA:  Right hindfoot amputation, drainage from surgical wound EXAM: RIGHT TIBIA AND FIBULA - 2 VIEW COMPARISON:  04/28/2023 FINDINGS: Frontal and lateral views of the right tibia and fibula are obtained. Postsurgical changes are seen from amputation at the  level of the right hindfoot. Resolution of subcutaneous gas at the surgical site. There is increased soft tissue swelling at the amputation margins. Skin staples are again identified. Cortical irregularity along the distal margin of the calcaneus has developed since prior exam, and developing osteomyelitis is a concern. The right tibia and fibula are unremarkable. The knee and ankle are in anatomic alignment. There is diffuse subcutaneous edema throughout the right lower leg. IMPRESSION: 1. Findings concerning for developing osteomyelitis of the distal margin of the calcaneus along the prior hindfoot amputation site. 2. Diffuse soft tissue swelling throughout the right lower leg and foot. Electronically Signed   By: Sharlet Salina M.D.   On: 05/29/2023 17:56   - pertinent xrays, CT, MRI studies were reviewed and independently interpreted  Positive ROS: All other systems have been reviewed and were otherwise negative with the exception of  those mentioned in the HPI and as above.  Physical Exam: General: Alert, no acute distress Psychiatric: Patient is competent for consent with normal mood and affect Lymphatic: No axillary or cervical lymphadenopathy Cardiovascular: No pedal edema Respiratory: No cyanosis, no use of accessory musculature GI: No organomegaly, abdomen is soft and non-tender    Images:  @ENCIMAGES @  Labs:  Lab Results  Component Value Date   HGBA1C 5.6 04/27/2023   HGBA1C 6.1 (H) 10/12/2022   HGBA1C 5.1 05/20/2022   ESRSEDRATE 9 04/24/2023   ESRSEDRATE 106 (H) 02/20/2023   ESRSEDRATE 6 11/21/2022   CRP 5.6 04/24/2023   CRP 50.4 (H) 02/20/2023   CRP 22.1 (H) 11/21/2022   REPTSTATUS PENDING 05/31/2023   REPTSTATUS PENDING 05/31/2023   GRAMSTAIN NO WBC SEEN NO ORGANISMS SEEN  04/28/2023   CULT  05/31/2023    NO GROWTH <12 HOURS Performed at [facility], [address] [place], [place] [address]    CULT  05/31/2023    NO GROWTH <12 HOURS Performed at [facility], [address] [place], [place] [address]    [facility] ESCHERICHIA COLI 03/29/2021    Lab Results  Component Value Date   ALBUMIN 3.7 04/27/2023   ALBUMIN 3.1 (L) 10/13/2022   ALBUMIN 4.4 08/31/2022   PREALBUMIN 14 (L) 05/20/2022   PREALBUMIN 14.0 (L) 10/11/2019        Latest Ref Rng & Units 05/31/2023    6:17 AM 05/30/2023   12:14 AM 05/29/2023    4:01 PM  CBC EXTENDED  WBC 4.0 - 10.5 K/uL 5.8  10.6  10.2   RBC 4.22 - 5.81 MIL/uL 4.18  4.27  4.73   Hemoglobin 13.0 - 17.0 g/dL 82.9  56.2  13.0   HCT 39.0 - 52.0 % 38.1  39.2  44.3   Platelets 150 - 400 K/uL 246  357  434   NEUT# 1.7 - 7.7 K/uL   6.6   Lymph# 0.7 - 4.0 K/uL   2.5     Neurologic: Patient does not have protective sensation bilateral lower extremities.   MUSCULOSKELETAL:   Skin: Examination there is purulent drainage from the right midfoot amputation laterally.  There is induration of the skin.  Review of the MRI scan shows osteomyelitis involving the forefoot.  There is also edema at the insertion of the Achilles.  White cell count 5.8 with a hemoglobin of 12.7.  Assessment: Assessment: Osteomyelitis and abscess right midfoot.  Plan: Recommended proceeding with a transtibial amputation.  Patient states he understands risk and benefits and would like to proceed with surgery.  The hospital does not have operating room time available on Friday would anticipate surgery on Saturday.  Thank you for the consult and the opportunity to see Mr. Johnathin Reinisch, MD [facility] [phone] 4:19 PM

## 2023-05-31 NOTE — Consult Note (Signed)
Subjective:  Patient ID: Eugene Diaz, male    DOB: 08/05/1966,  MRN: [id number]  Patient with past medical history of type 2 DM, CAD, depression, anxiety and left BKA in 2021 seen at beside today for concern of worsening right foot infection. He is known to our practice and recently underwent salvage procedure with Dr. Logan Bores that had failed. He was advised last week to come to the emergency room as he would need below knee amputation.    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) 05/23/2022   Depression    Diabetic neuropathy (HCC)    Edema of both lower extremities    Essential tremor    neurologist-- dr patel--- due to alcohol abuse   GERD (gastroesophageal reflux disease)    History of cellulitis 2020   left lower leg;   recurrent 03-25-2020   History of esophageal stricture    s/p  dilatation 02-13-2018   History of osteomyelitis 2020   left great toe     Hypercholesterolemia    Hypertension    followed by pcp  (nuclear stress test 03-11-2014 low risk w/ no ischemia, ef 65%   Normocytic anemia    followed by pcp   (03-18-2020 had transfusion's 02/ 2021)   Osteomyelitis of great toe of right foot (HCC) 08/18/2021   Peripheral vascular disease (HCC)    Severe depression (HCC) 06/09/2021   Status post incision and drainage followed by Dr Samuella Cota and pcp   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   (03-17-2020  pt states most of incision is healed with exception an area that is still draining,  daily dressing change),  foot is red but not warm to the touch and has swelling but has improved)   Suicidal ideation 06/09/2021   Syncope and collapse 06/07/2022   Syncope, near 06/20/2022   Thrombocytopenia (HCC)    chronic   Type 2 diabetes mellitus (HCC)    followed by  pcp---    (03-18-2020  pt stated does not check blood sugar)   Vaccine counseling 08/29/2022     Past Surgical History:  Procedure Laterality Date   AMPUTATION Left 10/16/2019   Procedure: Left partial second ray resection; placement of antibiotic beads;  Surgeon: Park Liter, DPM;  Location: [facility];  Service: Podiatry;  Laterality: Left;   AMPUTATION Left 11/13/2019   Procedure: Left Midfoot Amputation - Transmetatarsal vs. Lisfranc; Placement antibiotic beads;  Surgeon: Park Liter, DPM;  Location: [facility];  Service: Podiatry;  Laterality: Left;   AMPUTATION Left 12/21/2019   Procedure: Chopart Amputation left foot;  Surgeon: Park Liter, DPM;  Location: [facility];  Service: Podiatry;  Laterality: Left;   AMPUTATION Left 06/24/2020   Procedure: LEFT BELOW KNEE AMPUTATION;  Surgeon: Nadara Mustard, MD;  Location: [facility];  Service: Orthopedics;  Laterality: Left;   AMPUTATION Right 03/29/2021   Procedure: AMPUTATION RAY;  Surgeon: Park Liter, DPM;  Location: [facility];  Service: Podiatry;  Laterality: Right;   AMPUTATION Right 04/28/2023   Procedure: RIGHT PARTIAL FOOT AMPUTATION;  Surgeon: Felecia Shelling, DPM;  Location: [facility];  Service: Podiatry;  Laterality: Right;   AMPUTATION TOE Left 08/14/2019   Procedure: AMPUTATION GREAT  TOE;  Surgeon: Park Liter, DPM;  Location: [facility];  Service: Podiatry;  Laterality: Left;   APPLICATION OF WOUND VAC Left 12/21/2019   Procedure: Application Of Wound Vac;  Surgeon: Park Liter, DPM;  [facility]: [facility];  Service: Podiatry;  Laterality: Left;   BIOPSY  02/13/2018   Procedure: BIOPSY;  Surgeon: West Bali, MD;  Location: [facility];  Service: Endoscopy;;  transverse colon biopsy, gastric biopsy   BONE BIOPSY Right 04/28/2023   Procedure: BONE BIOPSY;  Surgeon: Felecia Shelling, DPM;  Location: [facility];  Service: Podiatry;  Laterality: Right;   COLONOSCOPY WITH PROPOFOL N/A 02/13/2018   Procedure: COLONOSCOPY WITH PROPOFOL;  Surgeon:  West Bali, MD;  [facility]: [facility];  Service: Endoscopy;  Laterality: N/A;  11:15am   ESOPHAGOGASTRODUODENOSCOPY (EGD) WITH PROPOFOL N/A 02/13/2018   Procedure: ESOPHAGOGASTRODUODENOSCOPY (EGD) WITH PROPOFOL;  Surgeon: West Bali, MD;  [facility]: [facility];  Service: Endoscopy;  Laterality: N/A;   I & D EXTREMITY Left 07/16/2019   Procedure: Insicion  AND DEBRIDEMENT EXTREMITY;  Surgeon: Park Liter, DPM;  [facility]: [facility];  Service: Podiatry;  Laterality: Left;   I & D EXTREMITY Left 06/22/2020   Procedure: IRRIGATION AND DEBRIDEMENT EXTREMITY, left foot and ankle;  Surgeon: Park Liter, DPM;  [facility]: [facility];  Service: Podiatry;  Laterality: Left;   I & D EXTREMITY Right 03/29/2021   Procedure: IRRIGATION AND DEBRIDEMENT EXTREMITY;  Surgeon: Park Liter, DPM;  [facility]: [facility];  Service: Podiatry;  Laterality: Right;   INCISION AND DRAINAGE OF WOUND Left 10/13/2019   Procedure: IRRIGATION AND DEBRIDEMENT WOUND OF LEFT FOOT AND FIRST METATARSAL RESECTION;  Surgeon: Park Liter, DPM;  Location: [facility];  Service: Podiatry;  Laterality: Left;   IRRIGATION AND DEBRIDEMENT FOOT Left 11/11/2019   Procedure: Left Foot Wound Irrigation and Debridement;  Surgeon: Park Liter, DPM;  Location: [facility];  Service: Podiatry;  Laterality: Left;   Left arm     Left arm repair (tendon and artery)   PERCUTANEOUS PINNING  07/16/2019   Procedure: Open Reduction Percutaneous Pinning Extremity;  Surgeon: Park Liter, DPM;  [facility];  Service: Podiatry;;   PILONIDAL CYST EXCISION N/A 08/08/2014   Procedure: CYST EXCISION PILONIDAL EXTENSIVE;  Surgeon: Dalia Heading, MD;  Location: [facility];  Service: General;  Laterality: N/A;   POLYPECTOMY  02/13/2018   Procedure: POLYPECTOMY;  Surgeon: West Bali, MD;  [facility];  Service: Endoscopy;;  transverse colon polyp hs, rectal polyps times 2   SAVORY DILATION N/A 02/13/2018   Procedure: SAVORY DILATION;   Surgeon: West Bali, MD;  [facility]: [facility];  Service: Endoscopy;  Laterality: N/A;   TENDON LENGTHENING Right 12/04/2021   Procedure: TENDON LENGTHENING;  Surgeon: Asencion Islam, DPM;  [facility];  Service: Podiatry;  Laterality: Right;   TRANSMETATARSAL AMPUTATION Right 12/04/2021   Procedure: TRANSMETATARSAL AMPUTATION;  Surgeon: Asencion Islam, DPM;  [facility];  Service: Podiatry;  Laterality: Right;   WOUND DEBRIDEMENT Left 09/04/2019   Procedure: DEBRIDEMENT WOUND WITH COMPLEX  REPAIR OF DEHISCENCE;  Surgeon: Park Liter, DPM;  Location: [facility];  Service: Podiatry;  Laterality: Left;  Leave patient on stretcher   WOUND DEBRIDEMENT Left 10/16/2019   Procedure: Left foot wound debridement and closure;  Surgeon: Park Liter, DPM;  Location: [facility];  Service: Podiatry;  Laterality: Left;   WOUND DEBRIDEMENT Left 12/18/2019  Procedure: LEFT FOOT DEBRIDEMENT WITH PARTIAL INCISION OF INFECTED BONE;  Surgeon: Park Liter, DPM;  [facility]: [facility];  Service: Podiatry;  Laterality: Left;  LEFT FOOT DEBRIDEMENT WITH PARTIAL INCISION OF INFECTED BONE   WOUND DEBRIDEMENT Right 04/01/2021   Procedure: Right foot wound debridement and irrigation and closure, bone biopsy;  Surgeon: Park Liter, DPM;  Location: [facility];  Service: Podiatry;  Laterality: Right;       Latest Ref Rng & Units 05/31/2023    6:17 AM 05/30/2023   12:14 AM 05/29/2023    4:01 PM  CBC  WBC 4.0 - 10.5 K/uL 5.8  10.6  10.2   Hemoglobin 13.0 - 17.0 g/dL 78.2  95.6  21.3   Hematocrit 39.0 - 52.0 % 38.1  39.2  44.3   Platelets 150 - 400 K/uL 246  357  434        Latest Ref Rng & Units 05/31/2023    6:17 AM 05/30/2023   12:14 AM 05/29/2023    4:01 PM  BMP  Glucose 70 - 99 mg/dL 97  086  578   BUN 6 - 20 mg/dL 12  10  9    Creatinine 0.61 - 1.24 mg/dL 4.69  6.29  5.28   Sodium 135 - 145 mmol/L 133  136  133   Potassium 3.5 - 5.1 mmol/L 4.1  3.6  3.8   Chloride 98 - 111 mmol/L  103  104  99   CO2 22 - 32 mmol/L 20  22  22    Calcium 8.9 - 10.3 mg/dL 8.5  8.2  8.7      Objective:   Vitals:   05/31/23 0339 05/31/23 0812  BP: 124/73 110/65  Pulse: 72 78  Resp: 17 17  Temp: 97.9 F (36.6 C) 98.2 F (36.8 C)  SpO2: 99% 97%    General:AA&O x 3. Normal mood and affect   Vascular: DP and PT pulses palpable on right. Brisk capillary refill to all digits. Pedal hair present   Neruological. Epicritic sensation grossly intact.   Derm: Right foot stump with maceration and necrotic debris and dehiscence of the wound with significant drainage and malodor.   MSK: MMT 5/5 in dorsiflexion, plantar flexion, inversion and eversion. Normal joint ROM without pain or crepitus.       Assessment & Plan:  Patient was evaluated and treated and all questions answered.  DX: s/p Chopart amputation with TAL right leg with wound dehiscence and infection and calcaneal osteomyelitis  Antibiotics: Per primary  Discussed with patient diagnosis and treatment options.  At this time patient would be best served with below knee amputation as infection has spread too far for salvage.  Patient to be seen by orthopedics to discuss plan for BKA.  Patient in agreement with plan and all questions answered.  Podiatry to sign off at this time.   Louann Sjogren, DPM  Accessible via secure chat for questions or concerns.

## 2023-06-01 DIAGNOSIS — I10 Essential (primary) hypertension: Secondary | ICD-10-CM | POA: Diagnosis not present

## 2023-06-01 DIAGNOSIS — R7881 Bacteremia: Secondary | ICD-10-CM | POA: Diagnosis not present

## 2023-06-01 DIAGNOSIS — M86171 Other acute osteomyelitis, right ankle and foot: Secondary | ICD-10-CM | POA: Diagnosis not present

## 2023-06-01 LAB — GLUCOSE, CAPILLARY
Glucose-Capillary: 127 mg/dL — ABNORMAL HIGH (ref 70–99)
Glucose-Capillary: 140 mg/dL — ABNORMAL HIGH (ref 70–99)
Glucose-Capillary: 145 mg/dL — ABNORMAL HIGH (ref 70–99)
Glucose-Capillary: 151 mg/dL — ABNORMAL HIGH (ref 70–99)
Glucose-Capillary: 176 mg/dL — ABNORMAL HIGH (ref 70–99)
Glucose-Capillary: 192 mg/dL — ABNORMAL HIGH (ref 70–99)
Glucose-Capillary: 193 mg/dL — ABNORMAL HIGH (ref 70–99)

## 2023-06-01 MED ORDER — TRANEXAMIC ACID 1000 MG/10ML IV SOLN
2000.0000 mg | INTRAVENOUS | Status: DC
  Filled 2023-06-01: qty 20

## 2023-06-01 NOTE — Progress Notes (Signed)
[facility]  PROGRESS NOTE  Eugene Diaz WUJ:[id number] DOB: 11/09/1965 DOA: 05/29/2023 PCP: Elfredia Nevins, MD   Brief HPI:   57 y.o. M with obesity, DM, HTN, left BKA and recent right Chopart amputation who presented with fever, chills and drainage/dehiscence of his recent surgical right foot wound     Assessment/Plan:    Sepsis due to enterococcus and Proteus bacteremia due to osteomyelitis of the right foot (HCC) Presented with tachycardia, fever.  Found to have bacteremia with enterococcus and proteus.  ID consulted. -Started on vancomycin, Rocephin and Flagyl -ID consulted; changed antibiotics to Unasyn only -TTE obtained shows thickening of the aortic valve -Follow blood culture results to decide regarding TEE, ID following   Acute osteomyelitis of calcaneum (HCC) [name] normal last Dec, suspect this is a small vessel ulcer or neuropathic. Orthopedics has been consulted; will undergoing potation on Friday   Obesity (BMI 30-39.9) BMI 30.7   Chronic pain - Continue home morphine   GAD (generalized anxiety disorder) - Continue Xanax   CAD in native artery - Continue Crestor   Essential hypertension BP controlled - Continue amlodipine   HLD (hyperlipidemia) - Continue Crestor   Type 2 diabetes mellitus with peripheral neuropathy (HCC) Glucose well controlled - Continue aspart - Continue amitriptyline for diabetes related neuropathy - Hold metformin    Medications     amitriptyline  100 mg Oral QHS   amLODipine  10 mg Oral Daily   Chlorhexidine Gluconate Cloth  6 each Topical Q0600   feeding supplement  237 mL Oral BID BM   insulin aspart  0-6 Units Subcutaneous Q4H   morphine  30 mg Oral Q12H   multivitamin with minerals  1 tablet Oral Daily   mupirocin ointment  1 Application Nasal BID   nutrition supplement (JUVEN)  1 packet Oral BID BM   polyethylene glycol  17 g Oral Daily   rosuvastatin  20 mg Oral Daily   senna-docusate  1 tablet Oral QHS      Data Reviewed:   CBG:  Recent Labs  Lab 05/31/23 1739 05/31/23 2036 06/01/23 0041 06/01/23 0420 06/01/23 0828  GLUCAP 177* 141* 176* 140* 145*    SpO2: 97 %    Vitals:   05/31/23 1738 05/31/23 2037 06/01/23 0546 06/01/23 0827  BP: 131/68 126/68 125/75 135/79  Pulse: 66 72 67 66  Resp: 17 18 18 17   Temp: 98.7 F (37.1 C) 98 F (36.7 C) 97.8 F (36.6 C) 97.8 F (36.6 C)  TempSrc: Oral Oral Oral Oral  SpO2: 97% 96% 95% 97%  Weight:      Height:          Data Reviewed:  Basic Metabolic Panel: Recent Labs  Lab 05/29/23 1601 05/30/23 0014 05/31/23 0617 06/01/23 0444  NA 133* 136 133* 134*  K 3.8 3.6 4.1 4.2  CL 99 104 103 100  CO2 22 22 20* 24  GLUCOSE 166* 199* 97 132*  BUN 9 10 12  22*  CREATININE 0.83 0.80 0.74 0.88  CALCIUM 8.7* 8.2* 8.5* 8.7*    CBC: Recent Labs  Lab 05/29/23 1601 05/30/23 0014 05/31/23 0617 06/01/23 0444  WBC 10.2 10.6* 5.8 6.4  NEUTROABS 6.6  --   --   --   HGB 14.8 13.0 12.7* 13.1  HCT 44.3 39.2 38.1* 39.4  MCV 93.7 91.8 91.1 93.4  PLT 434* 357 246 289    LFT No results for input(s): "AST", "ALT", "ALKPHOS", "BILITOT", "PROT", "ALBUMIN" in the last 168 hours.  Antibiotics: Anti-infectives (From admission, onward)    Start     Dose/Rate Route Frequency Ordered Stop   05/31/23 1800  Ampicillin-Sulbactam (UNASYN) 3 g in sodium chloride 0.9 % 100 mL IVPB        3 g 200 mL/hr over 30 Minutes Intravenous Every 6 hours 05/31/23 1224     05/30/23 1000  vancomycin (VANCOREADY) IVPB 1250 mg/250 mL  Status:  Discontinued        1,250 mg 166.7 mL/hr over 90 Minutes Intravenous Every 12 hours 05/29/23 2047 05/31/23 1224   05/29/23 2100  vancomycin (VANCOCIN) 2,500 mg in sodium chloride 0.9 % 500 mL IVPB        2,500 mg 262.5 mL/hr over 120 Minutes Intravenous  Once 05/29/23 2047 05/29/23 2335   05/29/23 2000  cefTRIAXone (ROCEPHIN) 2 g in sodium chloride 0.9 % 100 mL IVPB  Status:  Discontinued        2 g 200 mL/hr  over 30 Minutes Intravenous Every 24 hours 05/29/23 1953 05/31/23 1224   05/29/23 2000  metroNIDAZOLE (FLAGYL) tablet 500 mg  Status:  Discontinued        500 mg Oral Every 12 hours 05/29/23 1953 05/31/23 1224   05/29/23 1930  piperacillin-tazobactam (ZOSYN) IVPB 3.375 g  Status:  Discontinued        3.375 g 100 mL/hr over 30 Minutes Intravenous  Once 05/29/23 1929 05/29/23 1953        DVT prophylaxis: SCDs  Code Status: Full code  Family Communication: No family at bedside   CONSULTS orthopedics, infectious disease   Subjective   Denies any complaints   Objective    Physical Examination:   General-appears in no acute distress Heart-S1-S2, regular, no murmur auscultated Lungs-clear to auscultation bilaterally, no wheezing or crackles auscultated Abdomen-soft, nontender, no organomegaly Extremities-left BKA Neuro-alert, oriented x3, no focal deficit noted   Status is: Inpatient:             [name]   [facility] If 7PM-7AM, please contact night-coverage at [email or web address], Office  [phone]   06/01/2023, 9:21 AM  LOS: 3 days

## 2023-06-01 NOTE — Plan of Care (Signed)

## 2023-06-01 NOTE — Care Management Important Message (Signed)
Important Message  Patient Details  Name: Eugene Diaz MRN: [id number] Date of Birth: [birth year]   Medicare Important Message Given:  Yes     Sherilyn Banker 06/01/2023, 1:48 PM

## 2023-06-02 DIAGNOSIS — I10 Essential (primary) hypertension: Secondary | ICD-10-CM | POA: Diagnosis not present

## 2023-06-02 DIAGNOSIS — M86171 Other acute osteomyelitis, right ankle and foot: Secondary | ICD-10-CM | POA: Diagnosis not present

## 2023-06-02 DIAGNOSIS — R7881 Bacteremia: Secondary | ICD-10-CM | POA: Diagnosis not present

## 2023-06-02 LAB — GLUCOSE, CAPILLARY
Glucose-Capillary: 108 mg/dL — ABNORMAL HIGH (ref 70–99)
Glucose-Capillary: 122 mg/dL — ABNORMAL HIGH (ref 70–99)
Glucose-Capillary: 129 mg/dL — ABNORMAL HIGH (ref 70–99)
Glucose-Capillary: 122 mg/dL — ABNORMAL HIGH (ref 70–99)
Glucose-Capillary: 146 mg/dL — ABNORMAL HIGH (ref 70–99)
Glucose-Capillary: 188 mg/dL — ABNORMAL HIGH (ref 70–99)

## 2023-06-02 MED ORDER — VANCOMYCIN HCL 1250 MG/250ML IV SOLN
1250.0000 mg | Freq: Two times a day (BID) | INTRAVENOUS | Status: DC
  Administered 2023-06-02 – 2023-06-09 (×14): 1250 mg via INTRAVENOUS
  Filled 2023-06-02 (×20): qty 250

## 2023-06-02 NOTE — Progress Notes (Signed)
[facility]  PROGRESS NOTE  MATTEO BANKE ION:[id number] DOB: 05-Sep-1966 DOA: 05/29/2023 PCP: Elfredia Nevins, MD   Brief HPI:   57 y.o. M with obesity, DM, HTN, left BKA and recent right Chopart amputation who presented with fever, chills and drainage/dehiscence of his recent surgical right foot wound     Assessment/Plan:    Sepsis due to enterococcus and Proteus bacteremia due to osteomyelitis of the right foot (HCC) Presented with tachycardia, fever.  Found to have bacteremia with enterococcus and proteus.  ID consulted. -Started on vancomycin, Rocephin and Flagyl -ID consulted; changed antibiotics to Unasyn only -TTE obtained shows thickening of the aortic valve -Follow blood culture results to decide regarding TEE, ID following   Acute osteomyelitis of calcaneum (HCC) [name] normal last Dec, suspect this is a small vessel ulcer or neuropathic. Orthopedics has been consulted; surgery postponed for today.   Plan for amputation tomorrow   Obesity (BMI 30-39.9) BMI 30.7   Chronic pain - Continue home morphine   GAD (generalized anxiety disorder) - Continue Xanax   CAD in native artery - Continue Crestor   Essential hypertension BP controlled - Continue amlodipine   HLD (hyperlipidemia) - Continue Crestor   Type 2 diabetes mellitus with peripheral neuropathy (HCC) Glucose well controlled - Continue aspart - Continue amitriptyline for diabetes related neuropathy - Hold metformin    Medications     amitriptyline  100 mg Oral QHS   amLODipine  10 mg Oral Daily   Chlorhexidine Gluconate Cloth  6 each Topical Q0600   feeding supplement  237 mL Oral BID BM   insulin aspart  0-6 Units Subcutaneous Q4H   morphine  30 mg Oral Q12H   multivitamin with minerals  1 tablet Oral Daily   mupirocin ointment  1 Application Nasal BID   nutrition supplement (JUVEN)  1 packet Oral BID BM   polyethylene glycol  17 g Oral Daily   rosuvastatin  20 mg Oral Daily    senna-docusate  1 tablet Oral QHS   tranexamic acid (CYKLOKAPRON) 2,000 mg in sodium chloride 0.9 % 50 mL Topical Application  2,000 mg Topical To OR     Data Reviewed:   CBG:  Recent Labs  Lab 06/01/23 2021 06/01/23 2318 06/02/23 0353 06/02/23 0440 06/02/23 0850  GLUCAP 192* 151* 108* 122* 129*    SpO2: 99 %    Vitals:   06/01/23 1446 06/01/23 2025 06/02/23 0438 06/02/23 0849  BP: (!) 141/88 126/67 109/74 135/74  Pulse: 65 72 60 64  Resp: 18 18 18 17   Temp: 97.8 F (36.6 C) 97.9 F (36.6 C) 97.6 F (36.4 C) 97.6 F (36.4 C)  TempSrc: Oral Oral Oral Oral  SpO2: 98% 97% 98% 99%  Weight:      Height:          Data Reviewed:  Basic Metabolic Panel: Recent Labs  Lab 05/29/23 1601 05/30/23 0014 05/31/23 0617 06/01/23 0444  NA 133* 136 133* 134*  K 3.8 3.6 4.1 4.2  CL 99 104 103 100  CO2 22 22 20* 24  GLUCOSE 166* 199* 97 132*  BUN 9 10 12  22*  CREATININE 0.83 0.80 0.74 0.88  CALCIUM 8.7* 8.2* 8.5* 8.7*    CBC: Recent Labs  Lab 05/29/23 1601 05/30/23 0014 05/31/23 0617 06/01/23 0444  WBC 10.2 10.6* 5.8 6.4  NEUTROABS 6.6  --   --   --   HGB 14.8 13.0 12.7* 13.1  HCT 44.3 39.2 38.1* 39.4  MCV 93.7  91.8 91.1 93.4  PLT 434* 357 246 289    LFT No results for input(s): "AST", "ALT", "ALKPHOS", "BILITOT", "PROT", "ALBUMIN" in the last 168 hours.   Antibiotics: Anti-infectives (From admission, onward)    Start     Dose/Rate Route Frequency Ordered Stop   05/31/23 1800  Ampicillin-Sulbactam (UNASYN) 3 g in sodium chloride 0.9 % 100 mL IVPB        3 g 200 mL/hr over 30 Minutes Intravenous Every 6 hours 05/31/23 1224     05/30/23 1000  vancomycin (VANCOREADY) IVPB 1250 mg/250 mL  Status:  Discontinued        1,250 mg 166.7 mL/hr over 90 Minutes Intravenous Every 12 hours 05/29/23 2047 05/31/23 1224   05/29/23 2100  vancomycin (VANCOCIN) 2,500 mg in sodium chloride 0.9 % 500 mL IVPB        2,500 mg 262.5 mL/hr over 120 Minutes Intravenous  Once  05/29/23 2047 05/29/23 2335   05/29/23 2000  cefTRIAXone (ROCEPHIN) 2 g in sodium chloride 0.9 % 100 mL IVPB  Status:  Discontinued        2 g 200 mL/hr over 30 Minutes Intravenous Every 24 hours 05/29/23 1953 05/31/23 1224   05/29/23 2000  metroNIDAZOLE (FLAGYL) tablet 500 mg  Status:  Discontinued        500 mg Oral Every 12 hours 05/29/23 1953 05/31/23 1224   05/29/23 1930  piperacillin-tazobactam (ZOSYN) IVPB 3.375 g  Status:  Discontinued        3.375 g 100 mL/hr over 30 Minutes Intravenous  Once 05/29/23 1929 05/29/23 1953        DVT prophylaxis: SCDs  Code Status: Full code  Family Communication: No family at bedside   CONSULTS orthopedics, infectious disease   Subjective   Surgery got canceled today, plan for surgery tomorrow.  Denies any complaints.   Objective    Physical Examination:  General-appears in no acute distress Heart-S1-S2, regular, no murmur auscultated Lungs-clear to auscultation bilaterally, no wheezing or crackles auscultated Abdomen-soft, nontender, no organomegaly Extremities-status post left BKA, right foot in dressing Neuro-alert, oriented x3, no focal deficit noted   Status is: Inpatient:             [name]   [facility] If 7PM-7AM, please contact night-coverage at [email or web address], Office  [phone]   06/02/2023, 9:49 AM  LOS: 4 days

## 2023-06-02 NOTE — Progress Notes (Signed)
[facility] Infectious Diseases Follow Up Note  Patient Identification: Patient Name: Eugene Diaz MRN: [id number] Admit Date: 05/29/2023  3:45 PM Age: 57 y.o.Today's Date: 06/02/2023  Reason for Visit: Bacteremia, osteomyelitis  Principal Problem:   Sepsis due to enterococcus and Proteus bacteremia due to osteomyelitis of the right foot [facility]) Active Problems:   Type 2 diabetes mellitus with peripheral neuropathy (HCC)   HLD (hyperlipidemia)   Essential hypertension   CAD in native artery   GAD (generalized anxiety disorder)   Chronic pain   Obesity (BMI 30-39.9)   Acute osteomyelitis of calcaneum (HCC)   Bacteremia   Medication management   Current Antibiotics Unasyn 7/31-c Total days of antibiotics day 5  Lines/Hardwares:   Interval Events: Remains afebrile Labs today Right BKA planned for tomorrow   Assessment 57 year old male with history of HTN, type II DM, CAD, PVD, anxiety/depression, 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  Patient was discharged on doxycycline and Augmentin for a week post OR last admission.  He followed up with podiatry on 7/8 and a course of Augmentin was extended however he developed wound dehiscence with purulent drainage.  He was then advised to go to ED on follow-up on 7/24 for possible need for BKA.   # Wound dehiscence at rt chopart amputation/osteomyelitis of calcaneus/abscess # Polymicrobial bacteremia 2/2 above (Proteus mirabilis, E faecalis and proteus identified by [facility] ID, strep mitis/oralis, E faecalis, Proteus mirabilis isolated in blood cx) Repeat blood culture 7/31 no growth in 2 days TTE 7/31 moderate thickening of the  aortic valve.  Unlikely endocarditis and will not get  TEE  Recommendations Vancomycin added back as strep mitis/oralis R to penicillin and ceftriaxone, continue Unasyn  Planned for RT BKA tomorrow  Following   Rest of the management as per the primary team. Thank you for the consult. Please page with pertinent questions or concerns.  ______________________________________________________________________ Subjective patient seen and examined at the bedside. No complaints.   Vitals BP 135/74 (BP Location: Left Arm)   Pulse 64   Temp 97.6 F (36.4 C) (Oral)   Resp 17   Ht 6' 2.02" (1.88 m)   Wt 108.6 kg   SpO2 99%   BMI 30.73 kg/m     Physical Exam Constitutional: Adult male sitting in the bed    Comments:   Cardiovascular:     Rate and Rhythm: Normal rate and regular rhythm.     Heart sounds:   Pulmonary:     Effort: Pulmonary effort is normal.     Comments:   Abdominal:     Palpations:    Tenderness: Nondistended  Musculoskeletal:        General: No swelling or tenderness.  Left foot is bandaged C/C/I  Skin:    Comments: No rashes  Neurological:     General: Awake, alert and oriented, following commands  Psychiatric:        Mood and Affect: Mood normal.   Pertinent Microbiology Results for orders placed or performed during the hospital encounter of 05/29/23  Culture, blood (Routine X 2) w Reflex to ID Panel     Status: Abnormal (Preliminary result)   Collection Time: 05/29/23  7:48 PM   Specimen: BLOOD  Result Value Ref Range Status   Specimen Description BLOOD SITE NOT SPECIFIED  Final  Special Requests   Final    BOTTLES DRAWN AEROBIC AND ANAEROBIC Blood Culture results may not be optimal due to an excessive volume of blood received in culture bottles   Culture  Setup Time   Final    GRAM NEGATIVE RODS GRAM POSITIVE COCCI IN CLUSTERS IN BOTH AEROBIC AND ANAEROBIC BOTTLES CRITICAL RESULT CALLED TO, READ BACK BY AND VERIFIED WITH: PHARMD E.SINCLAIR AT 1206 ON 05/30/2023 BY T.SAAD.    Culture (A)   Final    PROTEUS MIRABILIS ENTEROCOCCUS FAECALIS SUSCEPTIBILITIES TO FOLLOW Performed at [facility], [address], [place], [place] [address]    Report Status PENDING  Incomplete   Organism ID, Bacteria PROTEUS MIRABILIS  Final      Susceptibility   Proteus mirabilis - MIC*    AMPICILLIN <=2 SENSITIVE Sensitive     CEFEPIME <=0.12 SENSITIVE Sensitive     CEFTAZIDIME <=1 SENSITIVE Sensitive     CEFTRIAXONE <=0.25 SENSITIVE Sensitive     CIPROFLOXACIN <=0.25 SENSITIVE Sensitive     GENTAMICIN <=1 SENSITIVE Sensitive     IMIPENEM 2 SENSITIVE Sensitive     TRIMETH/SULFA <=20 SENSITIVE Sensitive     AMPICILLIN/SULBACTAM <=2 SENSITIVE Sensitive     PIP/TAZO <=4 SENSITIVE Sensitive     * PROTEUS MIRABILIS  Blood Culture ID Panel (Reflexed)     Status: Abnormal   Collection Time: 05/29/23  7:48 PM  Result Value Ref Range Status   Enterococcus faecalis DETECTED (A) NOT DETECTED Final    Comment: CRITICAL RESULT CALLED TO, READ BACK BY AND VERIFIED WITH: PHARMD E.SINCLAIR AT 1206 ON 05/30/2023 BY T.SAAD.    Enterococcus Faecium NOT DETECTED NOT DETECTED Final   Listeria monocytogenes NOT DETECTED NOT DETECTED Final   Staphylococcus species NOT DETECTED NOT DETECTED Final   Staphylococcus aureus (BCID) NOT DETECTED NOT DETECTED Final   Staphylococcus epidermidis NOT DETECTED NOT DETECTED Final   Staphylococcus lugdunensis NOT DETECTED NOT DETECTED Final   Streptococcus species NOT DETECTED NOT DETECTED Final   Streptococcus agalactiae NOT DETECTED NOT DETECTED Final   Streptococcus pneumoniae NOT DETECTED NOT DETECTED Final   Streptococcus pyogenes NOT DETECTED NOT DETECTED Final   A.calcoaceticus-baumannii NOT DETECTED NOT DETECTED Final   Bacteroides fragilis NOT DETECTED NOT DETECTED Final   Enterobacterales DETECTED (A) NOT DETECTED Final    Comment: Enterobacterales represent a large order of gram negative bacteria, not a single organism. CRITICAL RESULT CALLED TO, READ  BACK BY AND VERIFIED WITH: PHARMD E.SINCLAIR AT 1206 ON 05/30/2023 BY T.SAAD.    Enterobacter cloacae complex NOT DETECTED NOT DETECTED Final   Escherichia coli NOT DETECTED NOT DETECTED Final   Klebsiella aerogenes NOT DETECTED NOT DETECTED Final   Klebsiella oxytoca NOT DETECTED NOT DETECTED Final   Klebsiella pneumoniae NOT DETECTED NOT DETECTED Final   Proteus species DETECTED (A) NOT DETECTED Final    Comment: CRITICAL RESULT CALLED TO, READ BACK BY AND VERIFIED WITH: PHARMD E.SINCLAIR AT 1206 ON 05/30/2023 BY T.SAAD.    Salmonella species NOT DETECTED NOT DETECTED Final   Serratia marcescens NOT DETECTED NOT DETECTED Final   Haemophilus influenzae NOT DETECTED NOT DETECTED Final   Neisseria meningitidis NOT DETECTED NOT DETECTED Final   Pseudomonas aeruginosa NOT DETECTED NOT DETECTED Final   Stenotrophomonas maltophilia NOT DETECTED NOT DETECTED Final   Candida albicans NOT DETECTED NOT DETECTED Final   Candida auris NOT DETECTED NOT DETECTED Final   Candida glabrata NOT DETECTED NOT DETECTED Final   Candida krusei NOT DETECTED NOT DETECTED Final  Candida parapsilosis NOT DETECTED NOT DETECTED Final   Candida tropicalis NOT DETECTED NOT DETECTED Final   Cryptococcus neoformans/gattii NOT DETECTED NOT DETECTED Final   CTX-M ESBL NOT DETECTED NOT DETECTED Final   Carbapenem resistance IMP NOT DETECTED NOT DETECTED Final   Carbapenem resistance KPC NOT DETECTED NOT DETECTED Final   Carbapenem resistance NDM NOT DETECTED NOT DETECTED Final   Carbapenem resist OXA 48 LIKE NOT DETECTED NOT DETECTED Final   Vancomycin resistance NOT DETECTED NOT DETECTED Final   Carbapenem resistance VIM NOT DETECTED NOT DETECTED Final    Comment: Performed at [facility], [address], [place], [place] [address]  Culture, blood (Routine X 2) w Reflex to ID Panel     Status: Abnormal   Collection Time: 05/29/23  8:04 PM   Specimen: BLOOD  Result Value Ref Range Status   Specimen  Description BLOOD SITE NOT SPECIFIED  Final   Special Requests   Final    BOTTLES DRAWN AEROBIC AND ANAEROBIC Blood Culture adequate volume   Culture  Setup Time   Final    GRAM POSITIVE COCCI IN CHAINS ANAEROBIC BOTTLE ONLY CRITICAL VALUE NOTED.  VALUE IS CONSISTENT WITH PREVIOUSLY REPORTED AND CALLED VALUE. GRAM NEGATIVE RODS AEROBIC BOTTLE ONLY VALUE IS CONSISTENT WITH PREVIOUSLY REPORT AND CALLED VALUE    Culture (A)  Final    STREPTOCOCCUS MITIS/ORALIS PROTEUS MIRABILIS SUSCEPTIBILITIES PERFORMED ON PREVIOUS CULTURE WITHIN THE LAST 5 DAYS. Performed at [facility], [address], [place], [place] [address]    Report Status 06/02/2023 FINAL  Final   Organism ID, Bacteria STREPTOCOCCUS MITIS/ORALIS  Final      Susceptibility   Streptococcus mitis/oralis - MIC*    TETRACYCLINE >=16 RESISTANT Resistant     VANCOMYCIN 0.5 SENSITIVE Sensitive     CLINDAMYCIN >=1 RESISTANT Resistant     PENICILLIN Value in next row Resistant      RESISTANT4    CEFTRIAXONE Value in next row Resistant      RESISTANT4    * STREPTOCOCCUS MITIS/ORALIS  Surgical PCR screen     Status: Abnormal   Collection Time: 05/29/23 10:55 PM   Specimen: Nasal Mucosa; Nasal Swab  Result Value Ref Range Status   MRSA, PCR POSITIVE (A) NEGATIVE Final    Comment: RESULT CALLED TO, READ BACK BY AND VERIFIED WITH: S LYN RN 05/30/23 @ 0106 BY AB    Staphylococcus aureus POSITIVE (A) NEGATIVE Final    Comment: (NOTE) The Xpert SA Assay (FDA approved for NASAL specimens in patients 42 years of age and older), is one component of a comprehensive surveillance program. It is not intended to diagnose infection nor to guide or monitor treatment. Performed at [facility], [address], [place], [place] [address]   Culture, blood (Routine X 2) w Reflex to ID Panel     Status: None (Preliminary result)   Collection Time: 05/31/23  6:17 AM   Specimen: BLOOD  Result Value Ref Range Status   Specimen  Description BLOOD LEFT ANTECUBITAL  Final   Special Requests   Final    BOTTLES DRAWN AEROBIC AND ANAEROBIC Blood Culture adequate volume   Culture   Final    NO GROWTH 2 DAYS Performed at [facility], [address] [place], [place] [address]    Report Status PENDING  Incomplete  Culture, blood (Routine X 2) w Reflex to ID Panel     Status: None (Preliminary result)   Collection Time:  05/31/23  6:17 AM   Specimen: BLOOD LEFT HAND  Result Value Ref Range Status   Specimen Description BLOOD LEFT HAND  Final   Special Requests   Final    BOTTLES DRAWN AEROBIC AND ANAEROBIC Blood Culture adequate volume   Culture   Final    NO GROWTH 2 DAYS Performed at [facility], [address] [place], [place] [address]    Report Status PENDING  Incomplete   Pertinent Lab.    Latest Ref Rng & Units 06/01/2023    4:44 AM 05/31/2023    6:17 AM 05/30/2023   12:14 AM  CBC  WBC 4.0 - 10.5 K/uL 6.4  5.8  10.6   Hemoglobin 13.0 - 17.0 g/dL 60.4  54.0  98.1   Hematocrit 39.0 - 52.0 % 39.4  38.1  39.2   Platelets 150 - 400 K/uL 289  246  357       Latest Ref Rng & Units 06/01/2023    4:44 AM 05/31/2023    6:17 AM 05/30/2023   12:14 AM  CMP  Glucose 70 - 99 mg/dL 191  97  478   BUN 6 - 20 mg/dL 22  12  10    Creatinine 0.61 - 1.24 mg/dL 2.95  6.21  3.08   Sodium 135 - 145 mmol/L 134  133  136   Potassium 3.5 - 5.1 mmol/L 4.2  4.1  3.6   Chloride 98 - 111 mmol/L 100  103  104   CO2 22 - 32 mmol/L 24  20  22    Calcium 8.9 - 10.3 mg/dL 8.7  8.5  8.2    Pertinent Imaging today Plain films and CT images have been personally visualized and interpreted; radiology reports have been reviewed. Decision making incorporated into the Impression  No results found.   I have personally spent 50  minutes involved in face-to-face and non-face-to-face activities for this patient on the day of the visit. Professional time spent includes the following activities: Preparing to see the patient (review of  tests), Obtaining and/or reviewing separately obtained history (admission/discharge record), Performing a medically appropriate examination and/or evaluation , Ordering medications/tests/procedures, referring and communicating with other health care professionals, Documenting clinical information in the EMR, Independently interpreting results (not separately reported), Communicating results to the patient/family/caregiver, Counseling and educating the patient/family/caregiver and Care coordination (not separately reported).   Plan d/w requesting provider as well as ID pharm D  Note: This document was prepared using dragon voice recognition software and may include unintentional dictation errors.   Electronically signed by:   Odette Fraction, MD Infectious Disease Physician [facility] Pager: [phone]

## 2023-06-02 NOTE — Progress Notes (Signed)
Pharmacy Antibiotic Note  Eugene Diaz is a 57 y.o. male admitted on 05/29/2023 with bacteremia/wound infection.  Pharmacy has been consulted to add Vancomycin, cont Unasyn - for resistant strep mitis/oralis.   Will not re-load but will plan to give first 2 doses closer together, now and at the 2200 scheduled time for q12h dosing.   Plan: - Resume Vancomycin 1250 mg IV every 12 hours (eAUC 496, Vd 0.5, SCr 0.88) - Continue Unasyn 3g IV every 6 hours - Possible BKA 8/3 - Will continue to follow renal function, culture results, LOT, and antibiotic de-escalation plans   Height: 6' 2.02" (188 cm) Weight: 108.6 kg (239 lb 6.7 oz) IBW/kg (Calculated) : 82.24  Temp (24hrs), Avg:97.7 F (36.5 C), Min:97.6 F (36.4 C), Max:97.9 F (36.6 C)  Recent Labs  Lab 05/29/23 1601 05/29/23 1625 05/29/23 1954 05/30/23 0014 05/31/23 0617 06/01/23 0444  WBC 10.2  --   --  10.6* 5.8 6.4  CREATININE 0.83  --   --  0.80 0.74 0.88  LATICACIDVEN  --  1.5 1.5  --   --   --     Estimated Creatinine Clearance: 120.1 mL/min (by C-G formula based on SCr of 0.88 mg/dL).    No Known Allergies  Antimicrobials this admission: Vancomycin 7/29 >> 7/31; restart 8/2 Ceftriaxone 7/29 >> 7/30 Flagyl 7/29 >> 7/31 Unasyn 7/31 >>  Dose adjustments this admission: N/a  Microbiology results: 7/29 BCx >> Proteus in both sets (pan-sensitive) + E faecalis (sensi pending) in one set and strep mitis/oralis (mostly R except S-Vanc) in the other set  Thank you for allowing pharmacy to be a part of this patient's care.  Georgina Pillion, PharmD, BCPS, BCIDP Infectious Diseases Clinical Pharmacist 06/02/2023 12:25 PM   **Pharmacist phone directory can now be found on [email or web address] ([email or web address]).  Listed under [facility].

## 2023-06-03 ENCOUNTER — Encounter (HOSPITAL_COMMUNITY): Payer: Self-pay | Admitting: Family Medicine

## 2023-06-03 ENCOUNTER — Inpatient Hospital Stay (HOSPITAL_COMMUNITY): Payer: Medicare HMO | Admitting: Anesthesiology

## 2023-06-03 ENCOUNTER — Other Ambulatory Visit: Payer: Self-pay

## 2023-06-03 ENCOUNTER — Encounter (HOSPITAL_COMMUNITY)
Admission: EM | Disposition: A | Discharge status: Other Acute Inpatient Hospital | Payer: Self-pay | Source: Home / Self Care | Attending: Family Medicine

## 2023-06-03 DIAGNOSIS — Z79899 Other long term (current) drug therapy: Secondary | ICD-10-CM

## 2023-06-03 DIAGNOSIS — M869 Osteomyelitis, unspecified: Secondary | ICD-10-CM | POA: Diagnosis not present

## 2023-06-03 DIAGNOSIS — M86271 Subacute osteomyelitis, right ankle and foot: Secondary | ICD-10-CM | POA: Diagnosis not present

## 2023-06-03 DIAGNOSIS — R7881 Bacteremia: Secondary | ICD-10-CM | POA: Diagnosis not present

## 2023-06-03 DIAGNOSIS — E1152 Type 2 diabetes mellitus with diabetic peripheral angiopathy with gangrene: Secondary | ICD-10-CM

## 2023-06-03 DIAGNOSIS — M86171 Other acute osteomyelitis, right ankle and foot: Secondary | ICD-10-CM | POA: Diagnosis not present

## 2023-06-03 DIAGNOSIS — I2510 Atherosclerotic heart disease of native coronary artery without angina pectoris: Secondary | ICD-10-CM

## 2023-06-03 DIAGNOSIS — E1169 Type 2 diabetes mellitus with other specified complication: Secondary | ICD-10-CM | POA: Diagnosis not present

## 2023-06-03 DIAGNOSIS — I10 Essential (primary) hypertension: Secondary | ICD-10-CM | POA: Diagnosis not present

## 2023-06-03 HISTORY — PX: AMPUTATION: SHX166

## 2023-06-03 HISTORY — PX: APPLICATION OF WOUND VAC: SHX5189

## 2023-06-03 LAB — GLUCOSE, CAPILLARY
Glucose-Capillary: 111 mg/dL — ABNORMAL HIGH (ref 70–99)
Glucose-Capillary: 115 mg/dL — ABNORMAL HIGH (ref 70–99)
Glucose-Capillary: 119 mg/dL — ABNORMAL HIGH (ref 70–99)
Glucose-Capillary: 124 mg/dL — ABNORMAL HIGH (ref 70–99)
Glucose-Capillary: 124 mg/dL — ABNORMAL HIGH (ref 70–99)
Glucose-Capillary: 172 mg/dL — ABNORMAL HIGH (ref 70–99)
Glucose-Capillary: 180 mg/dL — ABNORMAL HIGH (ref 70–99)
Glucose-Capillary: 215 mg/dL — ABNORMAL HIGH (ref 70–99)

## 2023-06-03 SURGERY — AMPUTATION BELOW KNEE
Anesthesia: Monitor Anesthesia Care | Site: Knee | Laterality: Right

## 2023-06-03 MED ORDER — ONDANSETRON HCL 4 MG/2ML IJ SOLN
INTRAMUSCULAR | Status: DC | PRN
  Administered 2023-06-03: 4 mg via INTRAVENOUS

## 2023-06-03 MED ORDER — ACETAMINOPHEN 160 MG/5ML PO SOLN: 1000.0000 mg | Freq: Once | ORAL | Status: DC | PRN

## 2023-06-03 MED ORDER — POVIDONE-IODINE 10 % EX SWAB: 2.0000 | Freq: Once | CUTANEOUS | Status: DC

## 2023-06-03 MED ORDER — METOPROLOL TARTRATE 5 MG/5ML IV SOLN: 2.0000 mg | INTRAVENOUS | Status: DC | PRN

## 2023-06-03 MED ORDER — MIDAZOLAM HCL 2 MG/2ML IJ SOLN: 1.0000 mg | Freq: Once | INTRAMUSCULAR | Status: AC

## 2023-06-03 MED ORDER — ALUM & MAG HYDROXIDE-SIMETH 200-200-20 MG/5ML PO SUSP: 15.0000 mL | ORAL | Status: DC | PRN

## 2023-06-03 MED ORDER — PROPOFOL 10 MG/ML IV BOLUS
INTRAVENOUS | Status: AC
  Filled 2023-06-03: qty 20

## 2023-06-03 MED ORDER — LIDOCAINE-EPINEPHRINE (PF) 1.5 %-1:200000 IJ SOLN
INTRAMUSCULAR | Status: DC | PRN
  Administered 2023-06-03: 10 mL via PERINEURAL
  Administered 2023-06-03: 5 mL via PERINEURAL

## 2023-06-03 MED ORDER — CHLORHEXIDINE GLUCONATE 4 % EX SOLN: 60.0000 mL | Freq: Once | CUTANEOUS | Status: DC

## 2023-06-03 MED ORDER — BUPIVACAINE-EPINEPHRINE (PF) 0.5% -1:200000 IJ SOLN
INTRAMUSCULAR | Status: DC | PRN
  Administered 2023-06-03: 15 mL via PERINEURAL
  Administered 2023-06-03: 25 mL via PERINEURAL

## 2023-06-03 MED ORDER — MIDAZOLAM HCL 2 MG/2ML IJ SOLN
INTRAMUSCULAR | Status: AC
  Administered 2023-06-03: 1 mg via INTRAVENOUS
  Filled 2023-06-03: qty 2

## 2023-06-03 MED ORDER — BUPIVACAINE HCL (PF) 0.5 % IJ SOLN
INTRAMUSCULAR | Status: AC
  Filled 2023-06-03: qty 20

## 2023-06-03 MED ORDER — LACTATED RINGERS IV SOLN: INTRAVENOUS | Status: DC

## 2023-06-03 MED ORDER — PROPOFOL 500 MG/50ML IV EMUL
INTRAVENOUS | Status: DC | PRN
  Administered 2023-06-03: 50 ug/kg/min via INTRAVENOUS

## 2023-06-03 MED ORDER — VANCOMYCIN HCL 1500 MG/300ML IV SOLN: 1500.0000 mg | INTRAVENOUS | Status: DC

## 2023-06-03 MED ORDER — ZINC SULFATE 220 (50 ZN) MG PO CAPS
220.0000 mg | ORAL_CAPSULE | Freq: Every day | ORAL | Status: DC
  Administered 2023-06-03 – 2023-06-09 (×7): 220 mg via ORAL
  Filled 2023-06-03 (×7): qty 1

## 2023-06-03 MED ORDER — FENTANYL CITRATE (PF) 100 MCG/2ML IJ SOLN: 25.0000 ug | INTRAMUSCULAR | Status: DC | PRN

## 2023-06-03 MED ORDER — INSULIN ASPART 100 UNIT/ML IJ SOLN: 0.0000 [IU] | INTRAMUSCULAR | Status: DC | PRN

## 2023-06-03 MED ORDER — POLYETHYLENE GLYCOL 3350 17 G PO PACK: 17.0000 g | PACK | Freq: Every day | ORAL | Status: DC | PRN

## 2023-06-03 MED ORDER — OXYCODONE HCL 5 MG PO TABS
5.0000 mg | ORAL_TABLET | ORAL | Status: DC | PRN
  Administered 2023-06-07: 10 mg via ORAL

## 2023-06-03 MED ORDER — MAGNESIUM SULFATE 2 GM/50ML IV SOLN: 2.0000 g | Freq: Every day | INTRAVENOUS | Status: DC | PRN

## 2023-06-03 MED ORDER — POTASSIUM CHLORIDE CRYS ER 20 MEQ PO TBCR: 20.0000 meq | EXTENDED_RELEASE_TABLET | Freq: Every day | ORAL | Status: DC | PRN

## 2023-06-03 MED ORDER — OXYCODONE HCL 5 MG PO TABS: 5.0000 mg | ORAL_TABLET | Freq: Once | ORAL | Status: DC | PRN

## 2023-06-03 MED ORDER — PHENOL 1.4 % MT LIQD: 1.0000 | OROMUCOSAL | Status: DC | PRN

## 2023-06-03 MED ORDER — CHLORHEXIDINE GLUCONATE 0.12 % MT SOLN: 15.0000 mL | Freq: Once | OROMUCOSAL | Status: DC

## 2023-06-03 MED ORDER — PANTOPRAZOLE SODIUM 40 MG PO TBEC
40.0000 mg | DELAYED_RELEASE_TABLET | Freq: Every day | ORAL | Status: DC
  Administered 2023-06-03 – 2023-06-09 (×7): 40 mg via ORAL
  Filled 2023-06-03 (×7): qty 1

## 2023-06-03 MED ORDER — OXYCODONE HCL 5 MG/5ML PO SOLN: 5.0000 mg | Freq: Once | ORAL | Status: DC | PRN

## 2023-06-03 MED ORDER — GUAIFENESIN-DM 100-10 MG/5ML PO SYRP: 15.0000 mL | ORAL_SOLUTION | ORAL | Status: DC | PRN

## 2023-06-03 MED ORDER — VITAMIN C 500 MG PO TABS
1000.0000 mg | ORAL_TABLET | Freq: Every day | ORAL | Status: DC
  Administered 2023-06-03 – 2023-06-09 (×7): 1000 mg via ORAL
  Filled 2023-06-03 (×7): qty 2

## 2023-06-03 MED ORDER — ACETAMINOPHEN 325 MG PO TABS: 325.0000 mg | ORAL_TABLET | Freq: Four times a day (QID) | ORAL | Status: DC | PRN

## 2023-06-03 MED ORDER — LABETALOL HCL 5 MG/ML IV SOLN: 10.0000 mg | INTRAVENOUS | Status: DC | PRN

## 2023-06-03 MED ORDER — CEFAZOLIN SODIUM-DEXTROSE 2-4 GM/100ML-% IV SOLN
2.0000 g | INTRAVENOUS | Status: AC
  Administered 2023-06-03: 2 g via INTRAVENOUS

## 2023-06-03 MED ORDER — PROPOFOL 10 MG/ML IV BOLUS
INTRAVENOUS | Status: DC | PRN
  Administered 2023-06-03: 20 mg via INTRAVENOUS

## 2023-06-03 MED ORDER — 0.9 % SODIUM CHLORIDE (POUR BTL) OPTIME
TOPICAL | Status: DC | PRN
  Administered 2023-06-03: 1000 mL

## 2023-06-03 MED ORDER — SODIUM CHLORIDE 0.9 % IV SOLN: INTRAVENOUS | Status: DC

## 2023-06-03 MED ORDER — TRANEXAMIC ACID-NACL 1000-0.7 MG/100ML-% IV SOLN
1000.0000 mg | INTRAVENOUS | Status: AC
  Administered 2023-06-03: 1000 mg via INTRAVENOUS

## 2023-06-03 MED ORDER — TRANEXAMIC ACID-NACL 1000-0.7 MG/100ML-% IV SOLN
INTRAVENOUS | Status: AC
  Filled 2023-06-03: qty 100

## 2023-06-03 MED ORDER — ONDANSETRON HCL 4 MG/2ML IJ SOLN: 4.0000 mg | Freq: Four times a day (QID) | INTRAMUSCULAR | Status: DC | PRN

## 2023-06-03 MED ORDER — OXYCODONE HCL 5 MG PO TABS
10.0000 mg | ORAL_TABLET | ORAL | Status: DC | PRN
  Administered 2023-06-07: 15 mg via ORAL
  Filled 2023-06-03: qty 2
  Filled 2023-06-03: qty 3

## 2023-06-03 MED ORDER — FENTANYL CITRATE (PF) 100 MCG/2ML IJ SOLN
INTRAMUSCULAR | Status: AC
  Administered 2023-06-03: 50 ug via INTRAVENOUS
  Filled 2023-06-03: qty 2

## 2023-06-03 MED ORDER — FENTANYL CITRATE (PF) 100 MCG/2ML IJ SOLN: 50.0000 ug | Freq: Once | INTRAMUSCULAR | Status: AC

## 2023-06-03 MED ORDER — HYDRALAZINE HCL 20 MG/ML IJ SOLN: 5.0000 mg | INTRAMUSCULAR | Status: DC | PRN

## 2023-06-03 MED ORDER — CEFAZOLIN SODIUM-DEXTROSE 2-4 GM/100ML-% IV SOLN: 2.0000 g | Freq: Three times a day (TID) | INTRAVENOUS | Status: DC

## 2023-06-03 MED ORDER — DOCUSATE SODIUM 100 MG PO CAPS
100.0000 mg | ORAL_CAPSULE | Freq: Every day | ORAL | Status: DC
  Administered 2023-06-04 – 2023-06-09 (×5): 100 mg via ORAL
  Filled 2023-06-03 (×6): qty 1

## 2023-06-03 MED ORDER — HYDROMORPHONE HCL 1 MG/ML IJ SOLN
0.5000 mg | INTRAMUSCULAR | Status: DC | PRN
  Administered 2023-06-06: 1 mg via INTRAVENOUS
  Filled 2023-06-03: qty 1

## 2023-06-03 MED ORDER — BISACODYL 5 MG PO TBEC: 5.0000 mg | DELAYED_RELEASE_TABLET | Freq: Every day | ORAL | Status: DC | PRN

## 2023-06-03 MED ORDER — ORAL CARE MOUTH RINSE: 15.0000 mL | Freq: Once | OROMUCOSAL | Status: DC

## 2023-06-03 MED ORDER — JUVEN PO PACK
1.0000 | PACK | Freq: Two times a day (BID) | ORAL | Status: DC
  Administered 2023-06-04 – 2023-06-06 (×6): 1 via ORAL
  Filled 2023-06-03 (×3): qty 1

## 2023-06-03 MED ORDER — CHLORHEXIDINE GLUCONATE 0.12 % MT SOLN
OROMUCOSAL | Status: AC
  Filled 2023-06-03: qty 15

## 2023-06-03 MED ORDER — MAGNESIUM CITRATE PO SOLN: 1.0000 | Freq: Once | ORAL | Status: DC | PRN

## 2023-06-03 MED ORDER — ACETAMINOPHEN 10 MG/ML IV SOLN: 1000.0000 mg | Freq: Once | INTRAVENOUS | Status: DC | PRN

## 2023-06-03 MED ORDER — LIDOCAINE 2% (20 MG/ML) 5 ML SYRINGE
INTRAMUSCULAR | Status: DC | PRN
  Administered 2023-06-03: 40 mg via INTRAVENOUS

## 2023-06-03 MED ORDER — ACETAMINOPHEN 500 MG PO TABS: 1000.0000 mg | ORAL_TABLET | Freq: Once | ORAL | Status: DC | PRN

## 2023-06-03 MED ORDER — CEFAZOLIN SODIUM-DEXTROSE 2-4 GM/100ML-% IV SOLN
INTRAVENOUS | Status: AC
  Filled 2023-06-03: qty 100

## 2023-06-03 SURGICAL SUPPLY — 39 items
BAG COUNTER SPONGE SURGICOUNT (BAG) IMPLANT
BAG SPNG CNTER NS LX DISP (BAG)
BLADE SAW RECIP 87.9 MT (BLADE) ×1 IMPLANT
BLADE SURG 21 STRL SS (BLADE) ×1 IMPLANT
BNDG CMPR 5X6 CHSV STRCH STRL (GAUZE/BANDAGES/DRESSINGS)
BNDG COHESIVE 6X5 TAN ST LF (GAUZE/BANDAGES/DRESSINGS) IMPLANT
CANISTER WOUND CARE 500ML ATS (WOUND CARE) ×1 IMPLANT
COVER SURGICAL LIGHT HANDLE (MISCELLANEOUS) ×1 IMPLANT
CUFF TOURN SGL QUICK 34 (TOURNIQUET CUFF) ×1
CUFF TRNQT CYL 34X4.125X (TOURNIQUET CUFF) ×1 IMPLANT
DRAPE INCISE IOBAN 66X45 STRL (DRAPES) ×1 IMPLANT
DRAPE U-SHAPE 47X51 STRL (DRAPES) ×1 IMPLANT
DRESSING PREVENA PLUS CUSTOM (GAUZE/BANDAGES/DRESSINGS) ×1 IMPLANT
DRSG PREVENA PLUS CUSTOM (GAUZE/BANDAGES/DRESSINGS) ×1
DURAPREP 26ML APPLICATOR (WOUND CARE) ×1 IMPLANT
ELECT REM PT RETURN 9FT ADLT (ELECTROSURGICAL) ×1
ELECTRODE REM PT RTRN 9FT ADLT (ELECTROSURGICAL) ×1 IMPLANT
GLOVE BIOGEL PI IND STRL 9 (GLOVE) ×1 IMPLANT
GLOVE SURG ORTHO 9.0 STRL STRW (GLOVE) ×1 IMPLANT
GOWN STRL REUS W/ TWL XL LVL3 (GOWN DISPOSABLE) ×2 IMPLANT
GOWN STRL REUS W/TWL XL LVL3 (GOWN DISPOSABLE) ×2
GRAFT SKIN WND MICRO 38 (Tissue) IMPLANT
KIT BASIN OR (CUSTOM PROCEDURE TRAY) ×1 IMPLANT
KIT TURNOVER KIT B (KITS) ×1 IMPLANT
MANIFOLD NEPTUNE II (INSTRUMENTS) ×1 IMPLANT
NS IRRIG 1000ML POUR BTL (IV SOLUTION) ×1 IMPLANT
PACK ORTHO EXTREMITY (CUSTOM PROCEDURE TRAY) ×1 IMPLANT
PAD ARMBOARD 7.5X6 YLW CONV (MISCELLANEOUS) ×1 IMPLANT
PREVENA RESTOR ARTHOFORM 46X30 (CANNISTER) ×1 IMPLANT
SPONGE T-LAP 18X18 ~~LOC~~+RFID (SPONGE) IMPLANT
STAPLER VISISTAT 35W (STAPLE) IMPLANT
STOCKINETTE IMPERVIOUS LG (DRAPES) ×1 IMPLANT
SUT ETHILON 2 0 PSLX (SUTURE) IMPLANT
SUT SILK 2 0 (SUTURE) ×1
SUT SILK 2-0 18XBRD TIE 12 (SUTURE) ×1 IMPLANT
SUT VIC AB 1 CTX 27 (SUTURE) ×2 IMPLANT
TOWEL GREEN STERILE (TOWEL DISPOSABLE) ×1 IMPLANT
TUBE CONNECTING 12X1/4 (SUCTIONS) ×1 IMPLANT
YANKAUER SUCT BULB TIP NO VENT (SUCTIONS) ×1 IMPLANT

## 2023-06-03 NOTE — Progress Notes (Signed)
[facility]  PROGRESS NOTE  Eugene Diaz:[id number] DOB: 1965-11-14 DOA: 05/29/2023 PCP: Elfredia Nevins, MD   Brief HPI:   57 y.o. M with obesity, DM, HTN, left BKA and recent right Chopart amputation who presented with fever, chills and drainage/dehiscence of his recent surgical right foot wound     Assessment/Plan:   Sepsis due to enterococcus and Proteus bacteremia due to osteomyelitis of the right foot (HCC) Presented with tachycardia, fever.  Found to have bacteremia with enterococcus and proteus.  ID consulted. -Started on vancomycin, Rocephin and Flagyl -ID consulted; changed antibiotics to Unasyn only -TTE obtained shows thickening of the aortic valve -Follow blood culture results to decide regarding TEE, ID following -Blood culture x 2 are negative to date   Acute osteomyelitis of calcaneum (HCC) [name] normal last Dec, suspect this is a small vessel ulcer or neuropathic. Orthopedics has been consulted; surgery postponed for today.   S/p right bka   Obesity (BMI 30-39.9) BMI 30.7   Chronic pain - Continue home morphine   GAD (generalized anxiety disorder) - Continue Xanax   CAD in native artery - Continue Crestor   Essential hypertension BP controlled - Continue amlodipine   HLD (hyperlipidemia) - Continue Crestor   Type 2 diabetes mellitus with peripheral neuropathy (HCC) Glucose well controlled - Continue aspart - Continue amitriptyline for diabetes related neuropathy - Hold metformin    Medications     amitriptyline  100 mg Oral QHS   amLODipine  10 mg Oral Daily   vitamin C  1,000 mg Oral Daily   [START ON 06/04/2023] docusate sodium  100 mg Oral Daily   feeding supplement  237 mL Oral BID BM   insulin aspart  0-6 Units Subcutaneous Q4H   morphine  30 mg Oral Q12H   multivitamin with minerals  1 tablet Oral Daily   mupirocin ointment  1 Application Nasal BID   nutrition supplement (JUVEN)  1 packet Oral BID BM   nutrition  supplement (JUVEN)  1 packet Oral BID BM   pantoprazole  40 mg Oral Daily   polyethylene glycol  17 g Oral Daily   rosuvastatin  20 mg Oral Daily   senna-docusate  1 tablet Oral QHS   zinc sulfate  220 mg Oral Daily     Data Reviewed:   CBG:  Recent Labs  Lab 06/02/23 2346 06/03/23 0413 06/03/23 0750 06/03/23 1020 06/03/23 1150  GLUCAP 119* 115* 124* 124* 111*    SpO2: 96 %    Vitals:   06/03/23 1018 06/03/23 1030 06/03/23 1055 06/03/23 1100  BP: 113/66 117/84 115/62 101/63  Pulse: 79 82 70 72  Resp: 13 13 15 18   Temp: (!) 97.5 F (36.4 C) 97.7 F (36.5 C) 97.7 F (36.5 C) 97.7 F (36.5 C)  TempSrc:   Oral Oral  SpO2: 93% 94% 93% 96%  Weight:      Height:          Data Reviewed:  Basic Metabolic Panel: Recent Labs  Lab 05/29/23 1601 05/30/23 0014 05/31/23 0617 06/01/23 0444  NA 133* 136 133* 134*  K 3.8 3.6 4.1 4.2  CL 99 104 103 100  CO2 22 22 20* 24  GLUCOSE 166* 199* 97 132*  BUN 9 10 12  22*  CREATININE 0.83 0.80 0.74 0.88  CALCIUM 8.7* 8.2* 8.5* 8.7*    CBC: Recent Labs  Lab 05/29/23 1601 05/30/23 0014 05/31/23 0617 06/01/23 0444  WBC 10.2 10.6* 5.8 6.4  NEUTROABS 6.6  --   --   --  HGB 14.8 13.0 12.7* 13.1  HCT 44.3 39.2 38.1* 39.4  MCV 93.7 91.8 91.1 93.4  PLT 434* 357 246 289    LFT No results for input(s): "AST", "ALT", "ALKPHOS", "BILITOT", "PROT", "ALBUMIN" in the last 168 hours.   Antibiotics: Anti-infectives (From admission, onward)    Start     Dose/Rate Route Frequency Ordered Stop   06/03/23 1200  ceFAZolin (ANCEF) IVPB 2g/100 mL premix  Status:  Discontinued        2 g 200 mL/hr over 30 Minutes Intravenous Every 8 hours 06/03/23 1105 06/03/23 1211   06/03/23 0800  ceFAZolin (ANCEF) IVPB 2g/100 mL premix        2 g 200 mL/hr over 30 Minutes Intravenous On call to O.R. 06/03/23 9604 06/03/23 0925   06/03/23 0749  ceFAZolin (ANCEF) 2-4 GM/100ML-% IVPB       Note to Pharmacy: Aquilla Hacker M: cabinet override       06/03/23 0749 06/03/23 0931   06/03/23 0715  vancomycin (VANCOREADY) IVPB 1500 mg/300 mL  Status:  Discontinued        1,500 mg 150 mL/hr over 120 Minutes Intravenous On call to O.R. 06/03/23 0620 06/03/23 0638   06/02/23 1315  vancomycin (VANCOREADY) IVPB 1250 mg/250 mL        1,250 mg 166.7 mL/hr over 90 Minutes Intravenous 2 times daily 06/02/23 1222     05/31/23 1800  Ampicillin-Sulbactam (UNASYN) 3 g in sodium chloride 0.9 % 100 mL IVPB        3 g 200 mL/hr over 30 Minutes Intravenous Every 6 hours 05/31/23 1224     05/30/23 1000  vancomycin (VANCOREADY) IVPB 1250 mg/250 mL  Status:  Discontinued        1,250 mg 166.7 mL/hr over 90 Minutes Intravenous Every 12 hours 05/29/23 2047 05/31/23 1224   05/29/23 2100  vancomycin (VANCOCIN) 2,500 mg in sodium chloride 0.9 % 500 mL IVPB        2,500 mg 262.5 mL/hr over 120 Minutes Intravenous  Once 05/29/23 2047 05/29/23 2335   05/29/23 2000  cefTRIAXone (ROCEPHIN) 2 g in sodium chloride 0.9 % 100 mL IVPB  Status:  Discontinued        2 g 200 mL/hr over 30 Minutes Intravenous Every 24 hours 05/29/23 1953 05/31/23 1224   05/29/23 2000  metroNIDAZOLE (FLAGYL) tablet 500 mg  Status:  Discontinued        500 mg Oral Every 12 hours 05/29/23 1953 05/31/23 1224   05/29/23 1930  piperacillin-tazobactam (ZOSYN) IVPB 3.375 g  Status:  Discontinued        3.375 g 100 mL/hr over 30 Minutes Intravenous  Once 05/29/23 1929 05/29/23 1953        DVT prophylaxis: SCDs  Code Status: Full code  Family Communication: No family at bedside   CONSULTS orthopedics, infectious disease   Subjective   Came back from surgery.  Denies any complaints.   Objective    Physical Examination:  General-appears in no acute distress Heart-S1-S2, regular, no murmur auscultated Lungs-clear to auscultation bilaterally, no wheezing or crackles auscultated Abdomen-soft, nontender, no organomegaly Extremities-s/p right bka Neuro-alert, oriented x3, no focal  deficit noted   Status is: Inpatient:             [name]   [facility] If 7PM-7AM, please contact night-coverage at [email or web address], Office  [phone]   06/03/2023, 2:53 PM  LOS: 5 days

## 2023-06-03 NOTE — Anesthesia Procedure Notes (Signed)
Anesthesia Regional Block: Femoral nerve block   Pre-Anesthetic Checklist: , timeout performed,  Correct Patient, Correct Site, Correct Laterality,  Correct Procedure, Correct Position, site marked,  Risks and benefits discussed,  Surgical consent,  Pre-op evaluation,  At surgeon's request and post-op pain management  Laterality: Right and Lower  Prep: chloraprep       Needles:  Injection technique: Single-shot      Needle Length: 9cm  Needle Gauge: 22     Additional Needles: Arrow StimuQuik ECHO Echogenic Stimulating PNB Needle  Procedures:,,,, ultrasound used (permanent image in chart),,    Narrative:  Start time: 06/03/2023 8:36 AM End time: 06/03/2023 8:40 AM Injection made incrementally with aspirations every 5 mL.  Performed by: Personally  Anesthesiologist: Val Eagle, MD

## 2023-06-03 NOTE — Plan of Care (Signed)
  Problem: Coping: Goal: Ability to adjust to condition or change in health will improve Outcome: Progressing   Problem: Health Behavior/Discharge Planning: Goal: Ability to manage health-related needs will improve Outcome: Progressing   Problem: Metabolic: Goal: Ability to maintain appropriate glucose levels will improve Outcome: Progressing   Problem: Nutritional: Goal: Maintenance of adequate nutrition will improve Outcome: Progressing Goal: Progress toward achieving an optimal weight will improve Outcome: Progressing   Problem: Skin Integrity: Goal: Risk for impaired skin integrity will decrease Outcome: Progressing   Problem: Tissue Perfusion: Goal: Adequacy of tissue perfusion will improve Outcome: Progressing   Problem: Education: Goal: Knowledge of General Education information will improve Description: Including pain rating scale, medication(s)/side effects and non-pharmacologic comfort measures Outcome: Progressing   Problem: Health Behavior/Discharge Planning: Goal: Ability to manage health-related needs will improve Outcome: Progressing   Problem: Clinical Measurements: Goal: Ability to maintain clinical measurements within normal limits will improve Outcome: Progressing Goal: Will remain free from infection Outcome: Progressing Goal: Diagnostic test results will improve Outcome: Progressing Goal: Cardiovascular complication will be avoided Outcome: Progressing   Problem: Activity: Goal: Risk for activity intolerance will decrease Outcome: Progressing   Problem: Nutrition: Goal: Adequate nutrition will be maintained Outcome: Progressing   Problem: Coping: Goal: Level of anxiety will decrease Outcome: Progressing   Problem: Pain Managment: Goal: General experience of comfort will improve Outcome: Progressing   Problem: Safety: Goal: Ability to remain free from injury will improve Outcome: Progressing   Problem: Skin Integrity: Goal: Risk  for impaired skin integrity will decrease Outcome: Progressing

## 2023-06-03 NOTE — Anesthesia Procedure Notes (Signed)
Anesthesia Regional Block: Popliteal block   Pre-Anesthetic Checklist: , timeout performed,  Correct Patient, Correct Site, Correct Laterality,  Correct Procedure, Correct Position, site marked,  Risks and benefits discussed,  Surgical consent,  Pre-op evaluation,  At surgeon's request and post-op pain management  Laterality: Right and Lower  Prep: chloraprep       Needles:  Injection technique: Single-shot      Needle Length: 9cm  Needle Gauge: 22     Additional Needles: Arrow StimuQuik ECHO Echogenic Stimulating PNB Needle  Procedures:,,,, ultrasound used (permanent image in chart),,    Narrative:  Start time: 06/03/2023 8:41 AM End time: 06/03/2023 8:46 AM Injection made incrementally with aspirations every 5 mL.  Performed by: Personally  Anesthesiologist: Val Eagle, MD

## 2023-06-03 NOTE — Interval H&P Note (Signed)
History and Physical Interval Note:  06/03/2023 8:04 AM  Eugene Diaz  has presented today for surgery, with the diagnosis of Osteomyelitis/Abscess Right Foot.  The various methods of treatment have been discussed with the patient and family. After consideration of risks, benefits and other options for treatment, the patient has consented to  Procedure(s): RIGHT BELOW KNEE AMPUTATION (Right) as a surgical intervention.  The patient's history has been reviewed, patient examined, no change in status, stable for surgery.  I have reviewed the patient's chart and labs.  Questions were answered to the patient's satisfaction.     Nadara Mustard

## 2023-06-03 NOTE — Transfer of Care (Signed)
Immediate Anesthesia Transfer of Care Note  Patient: [name]  Procedure(s) Performed: RIGHT BELOW KNEE AMPUTATION (Right: Knee) APPLICATION OF WOUND VAC (Right: Knee)  Patient Location: PACU  Anesthesia Type:MAC and Regional  Level of Consciousness: awake, alert , and oriented  Airway & Oxygen Therapy: Patient Spontanous Breathing  Post-op Assessment: Report given to RN and Post -op Vital signs reviewed and stable  Post vital signs: Reviewed and stable  Last Vitals:  Vitals Value Taken Time  BP 113/66 06/03/23 1018  Temp 36.4 C 06/03/23 1018  Pulse 77 06/03/23 1021  Resp 12 06/03/23 1021  SpO2 93 % 06/03/23 1021  Vitals shown include unfiled device data.  Last Pain:  Vitals:   06/03/23 0750  TempSrc: Oral  PainSc:          Complications: No notable events documented.

## 2023-06-03 NOTE — Anesthesia Preprocedure Evaluation (Signed)
Anesthesia Evaluation  Patient identified by MRN, date of birth, ID band Patient awake    Reviewed: Allergy & Precautions, NPO status , Patient's Chart, lab work & pertinent test results  History of Anesthesia Complications Negative for: history of anesthetic complications  Airway Mallampati: III  TM Distance: >3 FB Neck ROM: Full    Dental  (+) Dental Advisory Given   Pulmonary asthma , Current Smoker and Patient abstained from smoking.   breath sounds clear to auscultation       Cardiovascular hypertension, + CAD and + Peripheral Vascular Disease   Rhythm:Regular     Neuro/Psych  PSYCHIATRIC DISORDERS Anxiety Depression     Neuromuscular disease    GI/Hepatic PUD,GERD  ,,  Endo/Other  diabetes    Renal/GU      Musculoskeletal  (+) Arthritis ,    Abdominal   Peds  Hematology  (+) Blood dyscrasia, anemia   Anesthesia Other Findings   Reproductive/Obstetrics                             Anesthesia Physical Anesthesia Plan  ASA: 3  Anesthesia Plan: MAC and Regional   Post-op Pain Management: Regional block*   Induction: Intravenous  PONV Risk Score and Plan: 0 and Propofol infusion and Treatment may vary due to age or medical condition  Airway Management Planned: Nasal Cannula, Natural Airway and Simple Face Mask  Additional Equipment: None  Intra-op Plan:   Post-operative Plan:   Informed Consent: I have reviewed the patients History and Physical, chart, labs and discussed the procedure including the risks, benefits and alternatives for the proposed anesthesia with the patient or authorized representative who has indicated his/her understanding and acceptance.     Dental advisory given  Plan Discussed with: CRNA  Anesthesia Plan Comments:        Anesthesia Quick Evaluation

## 2023-06-03 NOTE — Progress Notes (Addendum)
ID Brief note   Results for orders placed or performed during the hospital encounter of 05/29/23  Culture, blood (Routine X 2) w Reflex to ID Panel     Status: Abnormal   Collection Time: 05/29/23  7:48 PM   Specimen: BLOOD  Result Value Ref Range Status   Specimen Description BLOOD SITE NOT SPECIFIED  Final   Special Requests   Final    BOTTLES DRAWN AEROBIC AND ANAEROBIC Blood Culture results may not be optimal due to an excessive volume of blood received in culture bottles   Culture  Setup Time   Final    GRAM NEGATIVE RODS GRAM POSITIVE COCCI IN CLUSTERS IN BOTH AEROBIC AND ANAEROBIC BOTTLES CRITICAL RESULT CALLED TO, READ BACK BY AND VERIFIED WITH: PHARMD E.SINCLAIR AT 1206 ON 05/30/2023 BY T.SAAD. Performed at [facility], [address], [place], [place] [address]    Culture PROTEUS MIRABILIS ENTEROCOCCUS FAECALIS  (A)  Final   Report Status 06/03/2023 FINAL  Final   Organism ID, Bacteria PROTEUS MIRABILIS  Final   Organism ID, Bacteria ENTEROCOCCUS FAECALIS  Final      Susceptibility   Enterococcus faecalis - MIC*    AMPICILLIN <=2 SENSITIVE Sensitive     VANCOMYCIN 1 SENSITIVE Sensitive     GENTAMICIN SYNERGY RESISTANT Resistant     * ENTEROCOCCUS FAECALIS   Proteus mirabilis - MIC*    AMPICILLIN <=2 SENSITIVE Sensitive     CEFEPIME <=0.12 SENSITIVE Sensitive     CEFTAZIDIME <=1 SENSITIVE Sensitive     CEFTRIAXONE <=0.25 SENSITIVE Sensitive     CIPROFLOXACIN <=0.25 SENSITIVE Sensitive     GENTAMICIN <=1 SENSITIVE Sensitive     IMIPENEM 2 SENSITIVE Sensitive     TRIMETH/SULFA <=20 SENSITIVE Sensitive     AMPICILLIN/SULBACTAM <=2 SENSITIVE Sensitive     PIP/TAZO <=4 SENSITIVE Sensitive     * PROTEUS MIRABILIS  Blood Culture ID Panel (Reflexed)     Status: Abnormal   Collection Time: 05/29/23  7:48 PM  Result Value Ref Range Status   Enterococcus faecalis DETECTED (A) NOT DETECTED Final    Comment: CRITICAL RESULT CALLED TO, READ BACK BY AND VERIFIED  WITH: PHARMD E.SINCLAIR AT 1206 ON 05/30/2023 BY T.SAAD.    Enterococcus Faecium NOT DETECTED NOT DETECTED Final   Listeria monocytogenes NOT DETECTED NOT DETECTED Final   Staphylococcus species NOT DETECTED NOT DETECTED Final   Staphylococcus aureus (BCID) NOT DETECTED NOT DETECTED Final   Staphylococcus epidermidis NOT DETECTED NOT DETECTED Final   Staphylococcus lugdunensis NOT DETECTED NOT DETECTED Final   Streptococcus species NOT DETECTED NOT DETECTED Final   Streptococcus agalactiae NOT DETECTED NOT DETECTED Final   Streptococcus pneumoniae NOT DETECTED NOT DETECTED Final   Streptococcus pyogenes NOT DETECTED NOT DETECTED Final   A.calcoaceticus-baumannii NOT DETECTED NOT DETECTED Final   Bacteroides fragilis NOT DETECTED NOT DETECTED Final   Enterobacterales DETECTED (A) NOT DETECTED Final    Comment: Enterobacterales represent a large order of gram negative bacteria, not a single organism. CRITICAL RESULT CALLED TO, READ BACK BY AND VERIFIED WITH: PHARMD E.SINCLAIR AT 1206 ON 05/30/2023 BY T.SAAD.    Enterobacter cloacae complex NOT DETECTED NOT DETECTED Final   Escherichia coli NOT DETECTED NOT DETECTED Final   Klebsiella aerogenes NOT DETECTED NOT DETECTED Final   Klebsiella oxytoca NOT DETECTED NOT DETECTED Final   Klebsiella pneumoniae NOT DETECTED NOT DETECTED Final   Proteus species DETECTED (A) NOT DETECTED Final    Comment: CRITICAL RESULT CALLED TO, READ BACK  BY AND VERIFIED WITH: PHARMD E.SINCLAIR AT 1206 ON 05/30/2023 BY T.SAAD.    Salmonella species NOT DETECTED NOT DETECTED Final   Serratia marcescens NOT DETECTED NOT DETECTED Final   Haemophilus influenzae NOT DETECTED NOT DETECTED Final   Neisseria meningitidis NOT DETECTED NOT DETECTED Final   Pseudomonas aeruginosa NOT DETECTED NOT DETECTED Final   Stenotrophomonas maltophilia NOT DETECTED NOT DETECTED Final   Candida albicans NOT DETECTED NOT DETECTED Final   Candida auris NOT DETECTED NOT DETECTED Final    Candida glabrata NOT DETECTED NOT DETECTED Final   Candida krusei NOT DETECTED NOT DETECTED Final   Candida parapsilosis NOT DETECTED NOT DETECTED Final   Candida tropicalis NOT DETECTED NOT DETECTED Final   Cryptococcus neoformans/gattii NOT DETECTED NOT DETECTED Final   CTX-M ESBL NOT DETECTED NOT DETECTED Final   Carbapenem resistance IMP NOT DETECTED NOT DETECTED Final   Carbapenem resistance KPC NOT DETECTED NOT DETECTED Final   Carbapenem resistance NDM NOT DETECTED NOT DETECTED Final   Carbapenem resist OXA 48 LIKE NOT DETECTED NOT DETECTED Final   Vancomycin resistance NOT DETECTED NOT DETECTED Final   Carbapenem resistance VIM NOT DETECTED NOT DETECTED Final    Comment: Performed at [facility], [address] [place], [place] [address]  Culture, blood (Routine X 2) w Reflex to ID Panel     Status: Abnormal   Collection Time: 05/29/23  8:04 PM   Specimen: BLOOD  Result Value Ref Range Status   Specimen Description BLOOD SITE NOT SPECIFIED  Final   Special Requests   Final    BOTTLES DRAWN AEROBIC AND ANAEROBIC Blood Culture adequate volume   Culture  Setup Time   Final    GRAM POSITIVE COCCI IN CHAINS ANAEROBIC BOTTLE ONLY CRITICAL VALUE NOTED.  VALUE IS CONSISTENT WITH PREVIOUSLY REPORTED AND CALLED VALUE. GRAM NEGATIVE RODS AEROBIC BOTTLE ONLY VALUE IS CONSISTENT WITH PREVIOUSLY REPORT AND CALLED VALUE    Culture (A)  Final    STREPTOCOCCUS MITIS/ORALIS PROTEUS MIRABILIS SUSCEPTIBILITIES PERFORMED ON PREVIOUS CULTURE WITHIN THE LAST 5 DAYS. Performed at [facility], [address] [place], [place] [address]    Report Status 06/02/2023 FINAL  Final   Organism ID, Bacteria STREPTOCOCCUS MITIS/ORALIS  Final      Susceptibility   Streptococcus mitis/oralis - MIC*    TETRACYCLINE >=16 RESISTANT Resistant     VANCOMYCIN 0.5 SENSITIVE Sensitive     CLINDAMYCIN >=1 RESISTANT Resistant     PENICILLIN Value in next row Resistant      RESISTANT4     CEFTRIAXONE Value in next row Resistant      RESISTANT4    * STREPTOCOCCUS MITIS/ORALIS  Surgical PCR screen     Status: Abnormal   Collection Time: 05/29/23 10:55 PM   Specimen: Nasal Mucosa; Nasal Swab  Result Value Ref Range Status   MRSA, PCR POSITIVE (A) NEGATIVE Final    Comment: RESULT CALLED TO, READ BACK BY AND VERIFIED WITH: S LYN RN 05/30/23 @ 0106 BY AB    Staphylococcus aureus POSITIVE (A) NEGATIVE Final    Comment: (NOTE) The Xpert SA Assay (FDA approved for NASAL specimens in patients 31 years of age and older), is one component of a comprehensive surveillance program. It is not intended to diagnose infection nor to guide or monitor treatment. Performed at [facility], [address], [place], [place] [address]   Culture, blood (Routine X 2) w Reflex to ID Panel     Status: None (  Preliminary result)   Collection Time: 05/31/23  6:17 AM   Specimen: BLOOD  Result Value Ref Range Status   Specimen Description BLOOD LEFT ANTECUBITAL  Final   Special Requests   Final    BOTTLES DRAWN AEROBIC AND ANAEROBIC Blood Culture adequate volume   Culture   Final    NO GROWTH 3 DAYS Performed at [facility], [address], [place], [place] [address]    Report Status PENDING  Incomplete  Culture, blood (Routine X 2) w Reflex to ID Panel     Status: None (Preliminary result)   Collection Time: 05/31/23  6:17 AM   Specimen: BLOOD LEFT HAND  Result Value Ref Range Status   Specimen Description BLOOD LEFT HAND  Final   Special Requests   Final    BOTTLES DRAWN AEROBIC AND ANAEROBIC Blood Culture adequate volume   Culture   Final    NO GROWTH 3 DAYS Performed at [facility], [address], [place], [place] [address]    Report Status PENDING  Incomplete    Plan for Rt BKA today   Continue Vancomycin and Unasyn inpatient   On amitryptyline 100mg  po HS, low risk for serotonin syndrome, can consider holding while on linezolid.   Can switch to Linezolid  600mg  po bid through 8/15 ( strep mitis/oralis)  and amoxicillin 1 g po tid ( proteus and enterococcus) through 8/10 for treatment of secondary polymicrobial bacteremia when discharged  ID will so, please recall if needed   Odette Fraction, MD Infectious Disease Physician [facility] [address] [place], [place] [address] Phone: [phone]  Fax: [phone]

## 2023-06-03 NOTE — Op Note (Signed)
06/03/2023  10:14 AM  PATIENT:  Eugene Diaz    PRE-OPERATIVE DIAGNOSIS:  Osteomyelitis/Abscess Right Foot  POST-OPERATIVE DIAGNOSIS:  Same  PROCEDURE:  RIGHT BELOW KNEE AMPUTATION, APPLICATION OF WOUND VAC Application of Kerecis micro graft 38 cm and Kerecis sheet 7 x 10 cm. Application of Prevena customizable and Prevena arthroform wound VAC dressings Application of Vive Wear stump shrinker and the Hanger limb protector  SURGEON:  Nadara Mustard, MD  ANESTHESIA:   General  PREOPERATIVE INDICATIONS:  [name] is a  57 y.o. male with a diagnosis of Osteomyelitis/Abscess Right Foot who failed conservative measures and elected for surgical management.    The risks benefits and alternatives were discussed with the patient preoperatively including but not limited to the risks of infection, bleeding, nerve injury, cardiopulmonary complications, the need for revision surgery, among others, and the patient was willing to proceed.  OPERATIVE IMPLANTS:   Implant Name Type Inv. Item Serial No. Manufacturer Lot No. LRB No. Used Action  GRAFT SKIN WND MICRO 38 - [id number] Tissue GRAFT SKIN WND MICRO 38  [employer] [id number] Right 1 Implanted     OPERATIVE FINDINGS: muscle with good color, calcified arteries  OPERATIVE PROCEDURE: Patient was brought to the operating room after undergoing a regional anesthetic.  After adequate levels anesthesia were obtained a thigh tourniquet was placed and the lower extremity was prepped using DuraPrep draped into a sterile field. The foot was draped out of the sterile field with impervious stockinette.  A timeout was called and the tourniquet inflated.  A transverse skin incision was made 12 cm distal to the tibial tubercle, the incision curved proximally, and a large posterior flap was created.  The tibia was transected just proximal to the skin incision and beveled anteriorly.  The fibula was transected just proximal to the tibial incision.  The  sciatic nerve was pulled cut and allowed to retract.  The vascular bundles were suture ligated with 2-0 silk.  The tourniquet was deflated and hemostasis obtained.      The Kerecis micro powder 38 cm was applied to the open wound that has a 200 cm surface area.  The deep and superficial fascial layers were closed using #1 Vicryl.  The skin was closed using staples.    The Prevena customizable dressing was applied this was overwrapped with the arthroform sponge.  [name] was used to secure the sponges and the circumferential compression was secured to the skin with Dermatac.  This was connected to the wound VAC pump and had a good suction fit this was covered with a stump shrinker and a limb protector.  Patient was taken to the PACU in stable condition.   DISCHARGE PLANNING:  Antibiotic duration: 24-hour antibiotics  Weightbearing: Nonweightbearing on the operative extremity  Pain medication: Opioid pathway  Dressing care/ Wound VAC: Continue wound VAC with the Prevena plus pump at discharge for 1 week  Ambulatory devices: Walker or kneeling scooter  Discharge to: Discharge planning based on recommendations per physical therapy  Follow-up: In the office 1 week after discharge.

## 2023-06-04 DIAGNOSIS — M86171 Other acute osteomyelitis, right ankle and foot: Secondary | ICD-10-CM | POA: Diagnosis not present

## 2023-06-04 DIAGNOSIS — I10 Essential (primary) hypertension: Secondary | ICD-10-CM | POA: Diagnosis not present

## 2023-06-04 DIAGNOSIS — R7881 Bacteremia: Secondary | ICD-10-CM | POA: Diagnosis not present

## 2023-06-04 LAB — BASIC METABOLIC PANEL
Anion gap: 8 (ref 5–15)
BUN: 21 mg/dL — ABNORMAL HIGH (ref 6–20)
CO2: 21 mmol/L — ABNORMAL LOW (ref 22–32)
Calcium: 8.6 mg/dL — ABNORMAL LOW (ref 8.9–10.3)
Chloride: 104 mmol/L (ref 98–111)
Creatinine, Ser: 0.82 mg/dL (ref 0.61–1.24)
GFR, Estimated: >60 mL/min (ref >60)
Glucose, Bld: 157 mg/dL — ABNORMAL HIGH (ref 70–99)
Potassium: 4.1 mmol/L (ref 3.5–5.1)
Sodium: 133 mmol/L — ABNORMAL LOW (ref 135–145)

## 2023-06-04 LAB — GLUCOSE, CAPILLARY
Glucose-Capillary: 167 mg/dL — ABNORMAL HIGH (ref 70–99)
Glucose-Capillary: 151 mg/dL — ABNORMAL HIGH (ref 70–99)
Glucose-Capillary: 188 mg/dL — ABNORMAL HIGH (ref 70–99)
Glucose-Capillary: 218 mg/dL — ABNORMAL HIGH (ref 70–99)

## 2023-06-04 NOTE — Plan of Care (Signed)
  Problem: Coping: Goal: Ability to adjust to condition or change in health will improve Outcome: Progressing   Problem: Health Behavior/Discharge Planning: Goal: Ability to manage health-related needs will improve Outcome: Progressing   Problem: Metabolic: Goal: Ability to maintain appropriate glucose levels will improve Outcome: Progressing   Problem: Nutritional: Goal: Maintenance of adequate nutrition will improve Outcome: Progressing   Problem: Skin Integrity: Goal: Risk for impaired skin integrity will decrease Outcome: Progressing   Problem: Tissue Perfusion: Goal: Adequacy of tissue perfusion will improve Outcome: Progressing   Problem: Education: Goal: Knowledge of General Education information will improve Description: Including pain rating scale, medication(s)/side effects and non-pharmacologic comfort measures Outcome: Progressing   Problem: Health Behavior/Discharge Planning: Goal: Ability to manage health-related needs will improve Outcome: Progressing   Problem: Clinical Measurements: Goal: Ability to maintain clinical measurements within normal limits will improve Outcome: Progressing Goal: Will remain free from infection Outcome: Progressing Goal: Diagnostic test results will improve Outcome: Progressing   Problem: Activity: Goal: Risk for activity intolerance will decrease Outcome: Progressing   Problem: Nutrition: Goal: Adequate nutrition will be maintained Outcome: Progressing   Problem: Coping: Goal: Level of anxiety will decrease Outcome: Progressing   Problem: Elimination: Goal: Will not experience complications related to bowel motility Outcome: Progressing Goal: Will not experience complications related to urinary retention Outcome: Progressing   Problem: Pain Managment: Goal: General experience of comfort will improve Outcome: Progressing   Problem: Safety: Goal: Ability to remain free from injury will improve Outcome:  Progressing   Problem: Skin Integrity: Goal: Risk for impaired skin integrity will decrease Outcome: Progressing   Problem: Activity: Goal: Ability to perform//tolerate increased activity and mobilize with assistive devices will improve Outcome: Progressing   Problem: Clinical Measurements: Goal: Postoperative complications will be avoided or minimized Outcome: Progressing   Problem: Self-Care: Goal: Ability to meet self-care needs will improve Outcome: Progressing   Problem: Self-Concept: Goal: Ability to maintain and perform role responsibilities to the fullest extent possible will improve Outcome: Progressing   Problem: Pain Management: Goal: Pain level will decrease with appropriate interventions Outcome: Progressing   Problem: Skin Integrity: Goal: Demonstration of wound healing without infection will improve Outcome: Progressing

## 2023-06-04 NOTE — Progress Notes (Signed)
[facility]  PROGRESS NOTE  Eugene Diaz:[id number] DOB: Jul 21, 1966 DOA: 05/29/2023 PCP: Elfredia Nevins, MD   Brief HPI:   57 y.o. M with obesity, DM, HTN, left BKA and recent right Chopart amputation who presented with fever, chills and drainage/dehiscence of his recent surgical right foot wound     Assessment/Plan:   Sepsis due to enterococcus and Proteus bacteremia due to osteomyelitis of the right foot (HCC) Presented with tachycardia, fever.  Found to have bacteremia with enterococcus and proteus.  ID consulted. -Started on vancomycin, Rocephin and Flagyl -ID consulted; changed antibiotics to Unasyn only -TTE obtained shows thickening of the aortic valve -Follow blood culture results to decide regarding TEE, ID following -Blood culture x 2 are negative to date   Acute osteomyelitis of calcaneum (HCC), s/p right BKA [name] normal last Dec, suspect this is a small vessel ulcer or neuropathic. Orthopedics has been consulted; surgery postponed for today.   S/p right bka -Continue amitriptyline for phantom pain -Continue morphine 30 mg p.o. every 12 hours   Obesity (BMI 30-39.9) BMI 30.7   Chronic pain - Continue home morphine   GAD (generalized anxiety disorder) - Continue Xanax   CAD in native artery - Continue Crestor   Essential hypertension BP controlled - Continue amlodipine   HLD (hyperlipidemia) - Continue Crestor   Type 2 diabetes mellitus with peripheral neuropathy (HCC) Glucose well controlled - Continue aspart - Continue amitriptyline for diabetes related neuropathy - Hold metformin    Medications     amitriptyline  100 mg Oral QHS   amLODipine  10 mg Oral Daily   vitamin C  1,000 mg Oral Daily   docusate sodium  100 mg Oral Daily   feeding supplement  237 mL Oral BID BM   insulin aspart  0-6 Units Subcutaneous Q4H   morphine  30 mg Oral Q12H   multivitamin with minerals  1 tablet Oral Daily   nutrition supplement (JUVEN)  1  packet Oral BID BM   nutrition supplement (JUVEN)  1 packet Oral BID BM   pantoprazole  40 mg Oral Daily   polyethylene glycol  17 g Oral Daily   rosuvastatin  20 mg Oral Daily   senna-docusate  1 tablet Oral QHS   zinc sulfate  220 mg Oral Daily     Data Reviewed:   CBG:  Recent Labs  Lab 06/03/23 1558 06/03/23 2100 06/03/23 2349 06/04/23 0321 06/04/23 0916  GLUCAP 180* 215* 172* 167* 151*    SpO2: 99 %    Vitals:   06/03/23 1536 06/03/23 1947 06/04/23 0522 06/04/23 0929  BP: (!) 138/95 122/72 124/66 (!) 149/84  Pulse: (!) 103 (!) 107 91 85  Resp: 18 20 16 17   Temp: 98.6 F (37 C) 99 F (37.2 C) 99.6 F (37.6 C) 98.2 F (36.8 C)  TempSrc: Oral Oral Oral Oral  SpO2: 95% 97% 99% 99%  Weight:      Height:          Data Reviewed:  Basic Metabolic Panel: Recent Labs  Lab 05/29/23 1601 05/30/23 0014 05/31/23 0617 06/01/23 0444 06/04/23 0757  NA 133* 136 133* 134* 133*  K 3.8 3.6 4.1 4.2 4.1  CL 99 104 103 100 104  CO2 22 22 20* 24 21*  GLUCOSE 166* 199* 97 132* 157*  BUN 9 10 12  22* 21*  CREATININE 0.83 0.80 0.74 0.88 0.82  CALCIUM 8.7* 8.2* 8.5* 8.7* 8.6*    CBC: Recent Labs  Lab 05/29/23  1601 05/30/23 0014 05/31/23 0617 06/01/23 0444  WBC 10.2 10.6* 5.8 6.4  NEUTROABS 6.6  --   --   --   HGB 14.8 13.0 12.7* 13.1  HCT 44.3 39.2 38.1* 39.4  MCV 93.7 91.8 91.1 93.4  PLT 434* 357 246 289    LFT No results for input(s): "AST", "ALT", "ALKPHOS", "BILITOT", "PROT", "ALBUMIN" in the last 168 hours.   Antibiotics: Anti-infectives (From admission, onward)    Start     Dose/Rate Route Frequency Ordered Stop   06/03/23 1200  ceFAZolin (ANCEF) IVPB 2g/100 mL premix  Status:  Discontinued        2 g 200 mL/hr over 30 Minutes Intravenous Every 8 hours 06/03/23 1105 06/03/23 1211   06/03/23 0800  ceFAZolin (ANCEF) IVPB 2g/100 mL premix        2 g 200 mL/hr over 30 Minutes Intravenous On call to O.R. 06/03/23 0981 06/03/23 0925   06/03/23 0749   ceFAZolin (ANCEF) 2-4 GM/100ML-% IVPB       Note to Pharmacy: Crissie Sickles: cabinet override      06/03/23 0749 06/03/23 0931   06/03/23 0715  vancomycin (VANCOREADY) IVPB 1500 mg/300 mL  Status:  Discontinued        1,500 mg 150 mL/hr over 120 Minutes Intravenous On call to O.R. 06/03/23 0620 06/03/23 0638   06/02/23 1315  vancomycin (VANCOREADY) IVPB 1250 mg/250 mL        1,250 mg 166.7 mL/hr over 90 Minutes Intravenous 2 times daily 06/02/23 1222     05/31/23 1800  Ampicillin-Sulbactam (UNASYN) 3 g in sodium chloride 0.9 % 100 mL IVPB        3 g 200 mL/hr over 30 Minutes Intravenous Every 6 hours 05/31/23 1224     05/30/23 1000  vancomycin (VANCOREADY) IVPB 1250 mg/250 mL  Status:  Discontinued        1,250 mg 166.7 mL/hr over 90 Minutes Intravenous Every 12 hours 05/29/23 2047 05/31/23 1224   05/29/23 2100  vancomycin (VANCOCIN) 2,500 mg in sodium chloride 0.9 % 500 mL IVPB        2,500 mg 262.5 mL/hr over 120 Minutes Intravenous  Once 05/29/23 2047 05/29/23 2335   05/29/23 2000  cefTRIAXone (ROCEPHIN) 2 g in sodium chloride 0.9 % 100 mL IVPB  Status:  Discontinued        2 g 200 mL/hr over 30 Minutes Intravenous Every 24 hours 05/29/23 1953 05/31/23 1224   05/29/23 2000  metroNIDAZOLE (FLAGYL) tablet 500 mg  Status:  Discontinued        500 mg Oral Every 12 hours 05/29/23 1953 05/31/23 1224   05/29/23 1930  piperacillin-tazobactam (ZOSYN) IVPB 3.375 g  Status:  Discontinued        3.375 g 100 mL/hr over 30 Minutes Intravenous  Once 05/29/23 1929 05/29/23 1953        DVT prophylaxis: SCDs  Code Status: Full code  Family Communication: No family at bedside   CONSULTS orthopedics, infectious disease   Subjective   Complains of phantom pain   Objective    Physical Examination:   General-appears in no acute distress Heart-S1-S2, regular, no murmur auscultated Lungs-clear to auscultation bilaterally, no wheezing or crackles auscultated Abdomen-soft, nontender,  no organomegaly Extremities-s/p right BKA Neuro-alert, oriented x3, no focal deficit noted  Status is: Inpatient:         Eugene Diaz   [facility] If 7PM-7AM, please contact night-coverage at [email or web address], Office  [phone]   06/04/2023,  10:29 AM  LOS: 6 days

## 2023-06-04 NOTE — Evaluation (Signed)
Physical Therapy Evaluation Patient Details Name: Eugene Diaz MRN: [id number] DOB: [birth year] Today's Date: 06/04/2023  History of Present Illness  Pt is a 57 y.o. M who presents 7/29 with R foot pain and wound, no s/p R BKA on 8/3. PMH includes: L BKA, DM II, CAD, depression, anxiety, alcohol abuse, GERD, and PVD.   Clinical Impression  Pt in bed upon arrival of PT, agreeable to evaluation at this time. Prior to admission the pt was independent with use of crutches for mobility, living alone, but with family nearby who could assist as needed after d/c. The pt is hopeful he can return to full independence and living alone. The pt was limited this session by pain in RLE with transition to sitting EOB. He required increased time and use of bed rails to achieve sitting EOB, and was then unable to tolerate for >5 min due to pain. The pt does have his L prosthetic with him, may be able to progress mobility to the point where he can d/c home with daughter, however, at this time recommending intensive therapies after d/c to facilitate greatest possible wound healing and pt safety and independence with mobility.      If plan is discharge home, recommend the following: A lot of help with walking and/or transfers;A lot of help with bathing/dressing/bathroom;Assistance with cooking/housework;Assist for transportation;Help with stairs or ramp for entrance   Can travel by private vehicle        Equipment Recommendations None recommended by PT  Recommendations for Other Services  Rehab consult    Functional Status Assessment Patient has had a recent decline in their functional status and demonstrates the ability to make significant improvements in function in a reasonable and predictable amount of time.     Precautions / Restrictions Precautions Precautions: Fall Precaution Comments: chronic L BKA, has prosthetic, new R BKA Restrictions Weight Bearing Restrictions: No Other Position/Activity  Restrictions: RLE limb protector      Mobility  Bed Mobility Overal bed mobility: Needs Assistance Bed Mobility: Supine to Sit, Sit to Supine     Supine to sit: Min guard, HOB elevated Sit to supine: Min guard, HOB elevated   General bed mobility comments: use of HOB and rails    Transfers                   General transfer comment: deferred due to pt pain in sitting, unable to maintain >5 min sitting EOB      Balance Overall balance assessment: Needs assistance Sitting-balance support: Single extremity supported, Feet unsupported Sitting balance-Leahy Scale: Fair Sitting balance - Comments: minA to minG with UE support, pt with posterior lean when letting go with UE Postural control: Posterior lean                                   Pertinent Vitals/Pain Pain Assessment Pain Assessment: Faces Faces Pain Scale: Hurts even more Pain Location: RLE incision Pain Descriptors / Indicators: Discomfort, Grimacing, Sharp Pain Intervention(s): Limited activity within patient's tolerance, Monitored during session, Repositioned    Home Living Family/patient expects to be discharged to:: Private residence Living Arrangements: Alone Available Help at Discharge: Family;Available PRN/intermittently Type of Home: Mobile home Home Access: Ramped entrance       Home Layout: One level Home Equipment: Agricultural consultant (2 wheels);Crutches;BSC/3in1;Wheelchair - manual;Cane - single point;Other (comment);Shower seat Additional Comments: may go to daughter's house, has 4-5 steps to  enter, one level, tub shower    Prior Function Prior Level of Function : Independent/Modified Independent             Mobility Comments: working on power WC, using crutches ADLs Comments: independent, not driving, reports not eating much     Hand Dominance   Dominant Hand: Left    Extremity/Trunk Assessment   Upper Extremity Assessment Upper Extremity Assessment: Overall WFL  for tasks assessed    Lower Extremity Assessment Lower Extremity Assessment: RLE deficits/detail;LLE deficits/detail RLE Deficits / Details: limited hip flexion, suspect due to pain. limited active movement at hip and minimal activation of quad for knee extension RLE: Unable to fully assess due to pain RLE Sensation: WNL RLE Coordination: WNL LLE Deficits / Details: chronic L BKA, full ROM, able to maintain against mod pressure    Cervical / Trunk Assessment Cervical / Trunk Assessment: Normal  Communication   Communication: No difficulties  Cognition Arousal/Alertness: Awake/alert Behavior During Therapy: WFL for tasks assessed/performed Overall Cognitive Status: Within Functional Limits for tasks assessed                                          General Comments General comments (skin integrity, edema, etc.): VSS, limb protector adjusted on RLE    Exercises Amputee Exercises Quad Sets: AROM, Both, 10 reps, Supine Hip ABduction/ADduction: Left, 5 reps, Sidelying Hip Flexion/Marching: AROM, AAROM, Both, 5 reps, Supine   Assessment/Plan    PT Assessment Patient needs continued PT services  PT Problem List Decreased strength;Decreased range of motion;Decreased activity tolerance;Decreased balance;Decreased mobility       PT Treatment Interventions DME instruction;Gait training;Functional mobility training;Therapeutic activities;Therapeutic exercise;Balance training;Patient/family education    PT Goals (Current goals can be found in the Care Plan section)  Acute Rehab PT Goals Patient Stated Goal: to return to independence PT Goal Formulation: With patient Time For Goal Achievement: 06/18/23 Potential to Achieve Goals: Good    Frequency Min 1X/week        AM-PAC PT "6 Clicks" Mobility  Outcome Measure Help needed turning from your back to your side while in a flat bed without using bedrails?: A Little Help needed moving from lying on your back to  sitting on the side of a flat bed without using bedrails?: A Little Help needed moving to and from a bed to a chair (including a wheelchair)?: A Lot Help needed standing up from a chair using your arms (e.g., wheelchair or bedside chair)?: A Lot Help needed to walk in hospital room?: Total Help needed climbing 3-5 steps with a railing? : Total 6 Click Score: 12    End of Session   Activity Tolerance: Patient limited by pain Patient left: in bed;with call bell/phone within reach;with bed alarm set Nurse Communication: Mobility status PT Visit Diagnosis: Other abnormalities of gait and mobility (R26.89);Unsteadiness on feet (R26.81);Muscle weakness (generalized) (M62.81);Pain Pain - Right/Left: Right Pain - part of body: Leg    Time: 1478-2956 PT Time Calculation (min) (ACUTE ONLY): 34 min   Charges:   PT Evaluation $PT Eval Low Complexity: 1 Low PT Treatments $Therapeutic Activity: 8-22 mins PT General Charges $$ ACUTE PT VISIT: 1 Visit         Vickki Muff, PT, DPT   Acute Rehabilitation Department Office [phone] Secure Chat Communication Preferred  Ronnie Derby 06/04/2023, 4:37 PM

## 2023-06-04 NOTE — Plan of Care (Signed)

## 2023-06-05 ENCOUNTER — Encounter (HOSPITAL_COMMUNITY): Payer: Self-pay | Admitting: Orthopedic Surgery

## 2023-06-05 DIAGNOSIS — M86171 Other acute osteomyelitis, right ankle and foot: Secondary | ICD-10-CM | POA: Diagnosis not present

## 2023-06-05 DIAGNOSIS — I10 Essential (primary) hypertension: Secondary | ICD-10-CM | POA: Diagnosis not present

## 2023-06-05 DIAGNOSIS — R7881 Bacteremia: Secondary | ICD-10-CM | POA: Diagnosis not present

## 2023-06-05 LAB — GLUCOSE, CAPILLARY
Glucose-Capillary: 136 mg/dL — ABNORMAL HIGH (ref 70–99)
Glucose-Capillary: 140 mg/dL — ABNORMAL HIGH (ref 70–99)
Glucose-Capillary: 152 mg/dL — ABNORMAL HIGH (ref 70–99)
Glucose-Capillary: 179 mg/dL — ABNORMAL HIGH (ref 70–99)
Glucose-Capillary: 191 mg/dL — ABNORMAL HIGH (ref 70–99)
Glucose-Capillary: 232 mg/dL — ABNORMAL HIGH (ref 70–99)

## 2023-06-05 LAB — VANCOMYCIN, TROUGH: Vancomycin Tr: 11 ug/mL — ABNORMAL LOW (ref 15–20)

## 2023-06-05 MED ORDER — ENOXAPARIN SODIUM 40 MG/0.4ML IJ SOSY
40.0000 mg | PREFILLED_SYRINGE | INTRAMUSCULAR | Status: DC
  Administered 2023-06-05 – 2023-06-09 (×5): 40 mg via SUBCUTANEOUS
  Filled 2023-06-05 (×5): qty 0.4

## 2023-06-05 MED ORDER — MORPHINE SULFATE ER 15 MG PO TBCR
15.0000 mg | EXTENDED_RELEASE_TABLET | Freq: Two times a day (BID) | ORAL | Status: DC
  Administered 2023-06-05 – 2023-06-09 (×8): 15 mg via ORAL
  Filled 2023-06-05 (×8): qty 1

## 2023-06-05 MED ORDER — AMITRIPTYLINE HCL 50 MG PO TABS
100.0000 mg | ORAL_TABLET | Freq: Every day | ORAL | Status: DC
  Administered 2023-06-05 – 2023-06-08 (×4): 100 mg via ORAL
  Filled 2023-06-05 (×4): qty 2

## 2023-06-05 NOTE — Progress Notes (Signed)
[facility]  PROGRESS NOTE  ZAM ENT UJW:[id number] DOB: 15-Mar-1966 DOA: 05/29/2023 PCP: Elfredia Nevins, MD   Brief HPI:   57 y.o. M with obesity, DM, HTN, left BKA and recent right Chopart amputation who presented with fever, chills and drainage/dehiscence of his recent surgical right foot wound     Assessment/Plan:   Sepsis due to enterococcus and Proteus bacteremia due to osteomyelitis of the right foot (HCC) Presented with tachycardia, fever.  Found to have bacteremia with enterococcus and proteus.  ID consulted. -Started on vancomycin, Rocephin and Flagyl -ID consulted; changed antibiotics to Unasyn only -TTE obtained shows thickening of the aortic valve -Follow blood culture results to decide regarding TEE, ID following -Blood culture x 2 are negative to date -ID recommends  can switch to Linezolid 600mg  po bid through 8/15 ( strep mitis/oralis) and amoxicillin 1 g po tid ( proteus and enterococcus) through 8/10 for treatment of secondary polymicrobial bacteremia when discharged    Acute osteomyelitis of calcaneum (HCC), s/p right BKA [name] normal last Dec, suspect this is a small vessel ulcer or neuropathic. Orthopedics has been consulted; surgery postponed for today.   S/p right bka -Continue morphine 30 mg p.o. every 12 hours - continue Amitryptiline 100 mg po q hs for pain On amitryptyline 100mg  po HS, low risk for serotonin syndrome, can consider holding while on linezolid at discharge  Hypersomnolence -Currently on MS Contin 30 mg p.o. twice daily -Cut down MS Contin to 15 mg p.o. twice daily -Continue oxycodone as needed   Obesity (BMI 30-39.9) BMI 30.7   Chronic pain - Continue home morphine   GAD (generalized anxiety disorder) - Continue Xanax   CAD in native artery - Continue Crestor   Essential hypertension BP controlled - Continue amlodipine   HLD (hyperlipidemia) - Continue Crestor   Type 2 diabetes mellitus with peripheral neuropathy  (HCC) Glucose well controlled - Continue aspart - Continue amitriptyline for diabetes related neuropathy - Hold metformin    Medications     amLODipine  10 mg Oral Daily   vitamin C  1,000 mg Oral Daily   docusate sodium  100 mg Oral Daily   feeding supplement  237 mL Oral BID BM   insulin aspart  0-6 Units Subcutaneous Q4H   morphine  30 mg Oral Q12H   multivitamin with minerals  1 tablet Oral Daily   nutrition supplement (JUVEN)  1 packet Oral BID BM   nutrition supplement (JUVEN)  1 packet Oral BID BM   pantoprazole  40 mg Oral Daily   polyethylene glycol  17 g Oral Daily   rosuvastatin  20 mg Oral Daily   senna-docusate  1 tablet Oral QHS   zinc sulfate  220 mg Oral Daily     Data Reviewed:   CBG:  Recent Labs  Lab 06/04/23 1446 06/04/23 2012 06/05/23 0047 06/05/23 0349 06/05/23 0820  GLUCAP 188* 218* 140* 136* 191*    SpO2: 97 %    Vitals:   06/04/23 1601 06/04/23 2147 06/05/23 0349 06/05/23 0818  BP: (!) 146/87 (!) 153/78 (!) 153/90 132/75  Pulse: (!) 103 100 94 98  Resp: 17 18 17 17   Temp: 99.2 F (37.3 C) (!) 100.4 F (38 C) 98.6 F (37 C) 98.1 F (36.7 C)  TempSrc: Oral Oral Oral Oral  SpO2: 99% 97% 99% 97%  Weight:      Height:          Data Reviewed:  Basic Metabolic Panel: Recent Labs  Lab 05/29/23 1601 05/30/23 0014 05/31/23 0617 06/01/23 0444 06/04/23 0757  NA 133* 136 133* 134* 133*  K 3.8 3.6 4.1 4.2 4.1  CL 99 104 103 100 104  CO2 22 22 20* 24 21*  GLUCOSE 166* 199* 97 132* 157*  BUN 9 10 12  22* 21*  CREATININE 0.83 0.80 0.74 0.88 0.82  CALCIUM 8.7* 8.2* 8.5* 8.7* 8.6*    CBC: Recent Labs  Lab 05/29/23 1601 05/30/23 0014 05/31/23 0617 06/01/23 0444  WBC 10.2 10.6* 5.8 6.4  NEUTROABS 6.6  --   --   --   HGB 14.8 13.0 12.7* 13.1  HCT 44.3 39.2 38.1* 39.4  MCV 93.7 91.8 91.1 93.4  PLT 434* 357 246 289    LFT No results for input(s): "AST", "ALT", "ALKPHOS", "BILITOT", "PROT", "ALBUMIN" in the last 168  hours.   Antibiotics: Anti-infectives (From admission, onward)    Start     Dose/Rate Route Frequency Ordered Stop   06/03/23 1200  ceFAZolin (ANCEF) IVPB 2g/100 mL premix  Status:  Discontinued        2 g 200 mL/hr over 30 Minutes Intravenous Every 8 hours 06/03/23 1105 06/03/23 1211   06/03/23 0800  ceFAZolin (ANCEF) IVPB 2g/100 mL premix        2 g 200 mL/hr over 30 Minutes Intravenous On call to O.R. 06/03/23 1610 06/03/23 0925   06/03/23 0749  ceFAZolin (ANCEF) 2-4 GM/100ML-% IVPB       Note to Pharmacy: Aquilla Hacker M: cabinet override      06/03/23 0749 06/03/23 0931   06/03/23 0715  vancomycin (VANCOREADY) IVPB 1500 mg/300 mL  Status:  Discontinued        1,500 mg 150 mL/hr over 120 Minutes Intravenous On call to O.R. 06/03/23 9604 06/03/23 0638   06/02/23 1315  vancomycin (VANCOREADY) IVPB 1250 mg/250 mL        1,250 mg 166.7 mL/hr over 90 Minutes Intravenous 2 times daily 06/02/23 1222     05/31/23 1800  Ampicillin-Sulbactam (UNASYN) 3 g in sodium chloride 0.9 % 100 mL IVPB        3 g 200 mL/hr over 30 Minutes Intravenous Every 6 hours 05/31/23 1224     05/30/23 1000  vancomycin (VANCOREADY) IVPB 1250 mg/250 mL  Status:  Discontinued        1,250 mg 166.7 mL/hr over 90 Minutes Intravenous Every 12 hours 05/29/23 2047 05/31/23 1224   05/29/23 2100  vancomycin (VANCOCIN) 2,500 mg in sodium chloride 0.9 % 500 mL IVPB        2,500 mg 262.5 mL/hr over 120 Minutes Intravenous  Once 05/29/23 2047 05/29/23 2335   05/29/23 2000  cefTRIAXone (ROCEPHIN) 2 g in sodium chloride 0.9 % 100 mL IVPB  Status:  Discontinued        2 g 200 mL/hr over 30 Minutes Intravenous Every 24 hours 05/29/23 1953 05/31/23 1224   05/29/23 2000  metroNIDAZOLE (FLAGYL) tablet 500 mg  Status:  Discontinued        500 mg Oral Every 12 hours 05/29/23 1953 05/31/23 1224   05/29/23 1930  piperacillin-tazobactam (ZOSYN) IVPB 3.375 g  Status:  Discontinued        3.375 g 100 mL/hr over 30 Minutes Intravenous   Once 05/29/23 1929 05/29/23 1953        DVT prophylaxis: SCDs  Code Status: Full code  Family Communication: No family at bedside   CONSULTS orthopedics, infectious disease   Subjective   Patient very somnolent  this morning   Objective    Physical Examination:  General-appears in no acute distress Heart-S1-S2, regular, no murmur auscultated Lungs-clear to auscultation bilaterally, no wheezing or crackles auscultated Abdomen-soft, nontender, no organomegaly Extremities-s/p right BKA Neuro-alert, oriented x3, no focal deficit noted   Status is: Inpatient:         [name]   [facility] If 7PM-7AM, please contact night-coverage at [email or web address], Office  [phone]   06/05/2023, 9:32 AM  LOS: 7 days

## 2023-06-05 NOTE — Progress Notes (Signed)
Inpatient Rehab Admissions Coordinator:   Pt initial therapy session limited by pain yesterday.  Will see how he does today and f/u this afternoon.   Estill Dooms, PT, DPT Admissions Coordinator [phone] 06/05/23  11:03 AM

## 2023-06-05 NOTE — Progress Notes (Signed)
Inpatient Rehab Admissions Coordinator:   Unable to reach pt via phone.  Will continue efforts.    Estill Dooms, PT, DPT Admissions Coordinator [phone] 06/05/23  4:10 PM

## 2023-06-05 NOTE — Evaluation (Signed)
Occupational Therapy Evaluation Patient Details Name: Eugene Diaz MRN: [id number] DOB: 06-02-1966 Today's Date: 06/05/2023   History of Present Illness Pt is a 57 y.o. M who presents 7/29 with R foot pain and wound, no s/p R BKA on 8/3. PMH includes: L BKA, DM II, CAD, depression, anxiety, alcohol abuse, GERD, and PVD.   Clinical Impression   Prior to this admission, patient living alone, and independent with crutches. Patient endorses his family would help as needed. Currently, patient presenting with R leg pain, decreased activity tolerance, and need for increased assist to complete all aspects of care. Patient min guard for bed mobility, but max A of 2 to come to standing from elevated bed surface. Patient mod to max A for ADL management. OT recommending intensive therapies greater than three hours given patient's prior level of function, family support, and his overall motivation to return to his independence. OT will continue to follow acutely.      Recommendations for follow up therapy are one component of a multi-disciplinary discharge planning process, led by the attending physician.  Recommendations may be updated based on patient status, additional functional criteria and insurance authorization.   Assistance Recommended at Discharge Intermittent Supervision/Assistance  Patient can return home with the following Two people to help with walking and/or transfers;A lot of help with bathing/dressing/bathroom;Assist for transportation;Help with stairs or ramp for entrance    Functional Status Assessment  Patient has had a recent decline in their functional status and demonstrates the ability to make significant improvements in function in a reasonable and predictable amount of time.  Equipment Recommendations  Other (comment) (defer to next venue)    Recommendations for Other Services       Precautions / Restrictions Precautions Precautions: Fall Precaution Comments: chronic L  BKA, has prosthetic, new R BKA Restrictions Weight Bearing Restrictions: Yes RLE Weight Bearing: Non weight bearing Other Position/Activity Restrictions: RLE limb protector      Mobility Bed Mobility Overal bed mobility: Needs Assistance Bed Mobility: Supine to Sit, Sit to Supine     Supine to sit: Min guard, HOB elevated Sit to supine: Min guard, HOB elevated   General bed mobility comments: use of HOB and rails    Transfers Overall transfer level: Needs assistance Equipment used: Rolling walker (2 wheels) Transfers: Sit to/from Stand, Bed to chair/wheelchair/BSC Sit to Stand: Max assist, +2 physical assistance, From elevated surface          Lateral/Scoot Transfers: Min assist General transfer comment: maxA of 2 with RW and significantly elevated bed, unable to tolerate for more than 5 seconds. lateral scoot along EOB with minA      Balance Overall balance assessment: Needs assistance Sitting-balance support: Single extremity supported, Feet unsupported Sitting balance-Leahy Scale: Fair Sitting balance - Comments: minA to minG with UE support, pt with posterior lean when letting go with UE Postural control: Posterior lean Standing balance support: Bilateral upper extremity supported, During functional activity Standing balance-Leahy Scale: Poor Standing balance comment: dependent on maxA of 2 and BUE support                           ADL either performed or assessed with clinical judgement   ADL Overall ADL's : Needs assistance/impaired Eating/Feeding: Set up;Sitting   Grooming: Set up;Sitting   Upper Body Bathing: Min guard;Sitting   Lower Body Bathing: Maximal assistance;Sit to/from stand;Sitting/lateral leans   Upper Body Dressing : Set up;Sitting   Lower Body  Dressing: Maximal assistance;Sit to/from stand;Sitting/lateral leans   Toilet Transfer: Maximal assistance;+2 for safety/equipment;+2 for physical assistance   Toileting- Clothing  Manipulation and Hygiene: Maximal assistance;Sitting/lateral lean;Sit to/from stand Toileting - Clothing Manipulation Details (indicate cue type and reason): for peri care     Functional mobility during ADLs: Maximal assistance;+2 for physical assistance;+2 for safety/equipment;Cueing for safety;Cueing for sequencing;Rolling walker (2 wheels) General ADL Comments: Patient presenting with R leg pain, decreased activity tolerance, and need for increased assist to complete all aspects of care.     Vision Baseline Vision/History: 1 Wears glasses Ability to See in Adequate Light: 0 Adequate Patient Visual Report: No change from baseline Vision Assessment?: No apparent visual deficits     Perception Perception Perception Tested?: No   Praxis Praxis Praxis tested?: Not tested    Pertinent Vitals/Pain Pain Assessment Pain Assessment: Faces Faces Pain Scale: Hurts whole lot Pain Location: RLE incision Pain Descriptors / Indicators: Discomfort, Grimacing, Sharp Pain Intervention(s): Limited activity within patient's tolerance, Monitored during session, Repositioned     Hand Dominance Left   Extremity/Trunk Assessment Upper Extremity Assessment Upper Extremity Assessment: Overall WFL for tasks assessed   Lower Extremity Assessment Lower Extremity Assessment: Defer to PT evaluation   Cervical / Trunk Assessment Cervical / Trunk Assessment: Normal   Communication Communication Communication: No difficulties   Cognition Arousal/Alertness: Awake/alert Behavior During Therapy: WFL for tasks assessed/performed Overall Cognitive Status: Within Functional Limits for tasks assessed                                       General Comments  VSS on RA    Exercises     Shoulder Instructions      Home Living Family/patient expects to be discharged to:: Private residence Living Arrangements: Alone Available Help at Discharge: Family;Available PRN/intermittently Type  of Home: Mobile home Home Access: Ramped entrance     Home Layout: One level     Bathroom Shower/Tub: Chief Strategy Officer: Standard Bathroom Accessibility: Yes   Home Equipment: Agricultural consultant (2 wheels);Crutches;BSC/3in1;Wheelchair - manual;Cane - single point;Other (comment);Shower seat   Additional Comments: may go to daughter's house, has 4-5 steps to enter, one level, tub shower      Prior Functioning/Environment Prior Level of Function : Independent/Modified Independent             Mobility Comments: working on power WC, using crutches ADLs Comments: independent, not driving, reports not eating much        OT Problem List: Decreased strength;Decreased range of motion;Impaired balance (sitting and/or standing);Decreased activity tolerance;Decreased coordination;Decreased safety awareness;Decreased knowledge of use of DME or AE;Pain      OT Treatment/Interventions: Self-care/ADL training;Therapeutic exercise;Energy conservation;DME and/or AE instruction;Manual therapy;Therapeutic activities;Balance training;Patient/family education    OT Goals(Current goals can be found in the care plan section) Acute Rehab OT Goals Patient Stated Goal: to get better so i can be independent again OT Goal Formulation: With patient Time For Goal Achievement: 06/19/23 Potential to Achieve Goals: Good ADL Goals Pt Will Perform Lower Body Bathing: with min assist;sit to/from stand;sitting/lateral leans Pt Will Perform Lower Body Dressing: with min assist;sit to/from stand;sitting/lateral leans Pt Will Transfer to Toilet: with mod assist;stand pivot transfer;bedside commode Pt Will Perform Toileting - Clothing Manipulation and hygiene: with mod assist;sitting/lateral leans;sit to/from stand Additional ADL Goal #1: Patient will be able to complete functional acitivty in standing for 1-2 minutes to increase  overall activity tolerance in preparation for ADL and IADL tasks.  OT  Frequency: Min 2X/week    Co-evaluation   Reason for Co-Treatment: For patient/therapist safety;To address functional/ADL transfers PT goals addressed during session: Mobility/safety with mobility;Balance;Proper use of DME;Strengthening/ROM        AM-PAC OT "6 Clicks" Daily Activity     Outcome Measure Help from another person eating meals?: None Help from another person taking care of personal grooming?: None Help from another person toileting, which includes using toliet, bedpan, or urinal?: A Lot Help from another person bathing (including washing, rinsing, drying)?: A Lot Help from another person to put on and taking off regular upper body clothing?: A Little Help from another person to put on and taking off regular lower body clothing?: A Lot 6 Click Score: 17   End of Session Equipment Utilized During Treatment: Gait belt;Rolling walker (2 wheels) Nurse Communication: Mobility status  Activity Tolerance: Patient limited by fatigue;Patient limited by pain Patient left: in bed;with call bell/phone within reach;with bed alarm set;with nursing/sitter in room  OT Visit Diagnosis: Unsteadiness on feet (R26.81);Other abnormalities of gait and mobility (R26.89);Muscle weakness (generalized) (M62.81);Pain Pain - Right/Left: Right Pain - part of body: Leg                Time: 4098-1191 OT Time Calculation (min): 41 min Charges:  OT General Charges $OT Visit: 1 Visit OT Evaluation $OT Eval Moderate Complexity: 1 Mod  Pollyann Glen E. Esabella Stockinger, OTR/L Acute Rehabilitation Services [phone]   Cherlyn Cushing 06/05/2023, 3:53 PM

## 2023-06-05 NOTE — Anesthesia Postprocedure Evaluation (Signed)
Anesthesia Post Note  Patient: Eugene Diaz  Procedure(s) Performed: RIGHT BELOW KNEE AMPUTATION (Right: Knee) APPLICATION OF WOUND VAC (Right: Knee)     Patient location during evaluation: PACU Anesthesia Type: Regional and MAC Level of consciousness: awake and alert Pain management: pain level controlled Vital Signs Assessment: post-procedure vital signs reviewed and stable Respiratory status: spontaneous breathing, nonlabored ventilation, respiratory function stable and patient connected to nasal cannula oxygen Cardiovascular status: stable and blood pressure returned to baseline Postop Assessment: no apparent nausea or vomiting Anesthetic complications: no   No notable events documented.  Last Vitals:  Vitals:   06/05/23 0818 06/05/23 1316  BP: 132/75 133/63  Pulse: 98 95  Resp: 17   Temp: 36.7 C 37 C  SpO2: 97% 98%    Last Pain:  Vitals:   06/05/23 1313  TempSrc:   PainSc: 9                  Elesia Pemberton

## 2023-06-05 NOTE — Progress Notes (Signed)
Physical Therapy Treatment Patient Details Name: Eugene Diaz MRN: [id number] DOB: 02-02-1966 Today's Date: 06/05/2023   History of Present Illness Pt is a 57 y.o. M who presents 7/29 with R foot pain and wound, no s/p R BKA on 8/3. PMH includes: L BKA, DM II, CAD, depression, anxiety, alcohol abuse, GERD, and PVD.    PT Comments  The pt was agreeable to session, able to make good progress with mobility and progressed to standing with maxA of 2 and RW. He was able to don L prosthetic without assistance, but required significant assist and elevated bed to achieve short stand. Limited by pain in RLE and poor strength and endurance in LLE. The pt is very motivated to return to full independence, continue to recommend intensive therapies to allow for best recovery of strength in LLE and safety with mobility.     If plan is discharge home, recommend the following: A lot of help with walking and/or transfers;A lot of help with bathing/dressing/bathroom;Assistance with cooking/housework;Assist for transportation;Help with stairs or ramp for entrance   Can travel by private vehicle        Equipment Recommendations  None recommended by PT    Recommendations for Other Services       Precautions / Restrictions Precautions Precautions: Fall Precaution Comments: chronic L BKA, has prosthetic, new R BKA Restrictions Weight Bearing Restrictions: Yes RLE Weight Bearing: Non weight bearing Other Position/Activity Restrictions: RLE limb protector     Mobility  Bed Mobility Overal bed mobility: Needs Assistance Bed Mobility: Supine to Sit, Sit to Supine     Supine to sit: Min guard, HOB elevated Sit to supine: Min guard, HOB elevated   General bed mobility comments: use of HOB and rails    Transfers Overall transfer level: Needs assistance Equipment used: Rolling walker (2 wheels) Transfers: Sit to/from Stand, Bed to chair/wheelchair/BSC Sit to Stand: Max assist, +2 physical assistance,  From elevated surface          Lateral/Scoot Transfers: Min assist General transfer comment: maxA of 2 with RW and significantly elevated bed, unable to tolerate for more than 5 seconds. lateral scoot along EOB with minA            Balance Overall balance assessment: Needs assistance Sitting-balance support: Single extremity supported, Feet unsupported Sitting balance-Leahy Scale: Fair Sitting balance - Comments: minA to minG with UE support, pt with posterior lean when letting go with UE Postural control: Posterior lean Standing balance support: Bilateral upper extremity supported, During functional activity Standing balance-Leahy Scale: Poor Standing balance comment: dependent on maxA of 2 and BUE support                            Cognition Arousal/Alertness: Awake/alert Behavior During Therapy: WFL for tasks assessed/performed Overall Cognitive Status: Within Functional Limits for tasks assessed                                             General Comments General comments (skin integrity, edema, etc.): VSS on RA      Pertinent Vitals/Pain Pain Assessment Pain Assessment: Faces Faces Pain Scale: Hurts whole lot Pain Location: RLE incision Pain Descriptors / Indicators: Discomfort, Grimacing, Sharp Pain Intervention(s): Limited activity within patient's tolerance, Monitored during session, Repositioned, Patient requesting pain meds-RN notified     PT Goals (current  goals can now be found in the care plan section) Acute Rehab PT Goals Patient Stated Goal: to return to independence PT Goal Formulation: With patient Time For Goal Achievement: 06/18/23 Potential to Achieve Goals: Good Progress towards PT goals: Progressing toward goals    Frequency    Min 1X/week      PT Plan Current plan remains appropriate    Co-evaluation PT/OT/SLP Co-Evaluation/Treatment: Yes Reason for Co-Treatment: For patient/therapist safety;To  address functional/ADL transfers PT goals addressed during session: Mobility/safety with mobility;Balance;Proper use of DME;Strengthening/ROM        AM-PAC PT "6 Clicks" Mobility   Outcome Measure  Help needed turning from your back to your side while in a flat bed without using bedrails?: A Little Help needed moving from lying on your back to sitting on the side of a flat bed without using bedrails?: A Little Help needed moving to and from a bed to a chair (including a wheelchair)?: A Lot Help needed standing up from a chair using your arms (e.g., wheelchair or bedside chair)?: A Lot Help needed to walk in hospital room?: Total Help needed climbing 3-5 steps with a railing? : Total 6 Click Score: 12    End of Session Equipment Utilized During Treatment: Gait belt Activity Tolerance: Patient limited by pain Patient left: in bed;with call bell/phone within reach;with bed alarm set Nurse Communication: Mobility status PT Visit Diagnosis: Other abnormalities of gait and mobility (R26.89);Unsteadiness on feet (R26.81);Muscle weakness (generalized) (M62.81);Pain Pain - Right/Left: Right Pain - part of body: Leg     Time: 1610-9604 PT Time Calculation (min) (ACUTE ONLY): 41 min  Charges:    $Therapeutic Exercise: 8-22 mins $Therapeutic Activity: 8-22 mins PT General Charges $$ ACUTE PT VISIT: 1 Visit                     Vickki Muff, PT, DPT   Acute Rehabilitation Department Office [phone] Secure Chat Communication Preferred   Ronnie Derby 06/05/2023, 2:06 PM

## 2023-06-05 NOTE — Progress Notes (Signed)
Patient ID: Eugene Diaz, male   DOB: Mar 26, 1966, 57 y.o.   MRN: [id number] Patient is status post transtibial amputation.  There is no drainage in the wound VAC canister there is a good suction fit.  Patient may benefit from inpatient rehab prior to discharge.

## 2023-06-06 DIAGNOSIS — R52 Pain, unspecified: Secondary | ICD-10-CM

## 2023-06-06 DIAGNOSIS — R7881 Bacteremia: Secondary | ICD-10-CM | POA: Diagnosis not present

## 2023-06-06 DIAGNOSIS — Z89511 Acquired absence of right leg below knee: Secondary | ICD-10-CM

## 2023-06-06 DIAGNOSIS — M869 Osteomyelitis, unspecified: Secondary | ICD-10-CM | POA: Diagnosis not present

## 2023-06-06 DIAGNOSIS — M86171 Other acute osteomyelitis, right ankle and foot: Secondary | ICD-10-CM | POA: Diagnosis not present

## 2023-06-06 DIAGNOSIS — I25810 Atherosclerosis of coronary artery bypass graft(s) without angina pectoris: Secondary | ICD-10-CM | POA: Diagnosis not present

## 2023-06-06 DIAGNOSIS — I10 Essential (primary) hypertension: Secondary | ICD-10-CM | POA: Diagnosis not present

## 2023-06-06 DIAGNOSIS — Z6830 Body mass index (BMI) 30.0-30.9, adult: Secondary | ICD-10-CM

## 2023-06-06 DIAGNOSIS — G548 Other nerve root and plexus disorders: Secondary | ICD-10-CM

## 2023-06-06 DIAGNOSIS — E1142 Type 2 diabetes mellitus with diabetic polyneuropathy: Secondary | ICD-10-CM | POA: Diagnosis not present

## 2023-06-06 DIAGNOSIS — E669 Obesity, unspecified: Secondary | ICD-10-CM | POA: Diagnosis not present

## 2023-06-06 LAB — GLUCOSE, CAPILLARY
Glucose-Capillary: 168 mg/dL — ABNORMAL HIGH (ref 70–99)
Glucose-Capillary: 178 mg/dL — ABNORMAL HIGH (ref 70–99)
Glucose-Capillary: 179 mg/dL — ABNORMAL HIGH (ref 70–99)
Glucose-Capillary: 183 mg/dL — ABNORMAL HIGH (ref 70–99)
Glucose-Capillary: 197 mg/dL — ABNORMAL HIGH (ref 70–99)
Glucose-Capillary: 215 mg/dL — ABNORMAL HIGH (ref 70–99)
Glucose-Capillary: 220 mg/dL — ABNORMAL HIGH (ref 70–99)

## 2023-06-06 LAB — SURGICAL PATHOLOGY

## 2023-06-06 NOTE — Progress Notes (Signed)
[facility]  PROGRESS NOTE  Eugene Diaz:[id number] DOB: January 04, 1966 DOA: 05/29/2023 PCP: Elfredia Nevins, MD   Brief HPI:   57 y.o. M with obesity, DM, HTN, left BKA and recent right [place] amputation who presented with fever, chills and drainage/dehiscence of his recent surgical right foot wound     Assessment/Plan:   Sepsis due to enterococcus and Proteus bacteremia due to osteomyelitis of the right foot (HCC) Presented with tachycardia, fever.  Found to have bacteremia with enterococcus and proteus.  ID consulted. -Started on vancomycin, Rocephin and Flagyl -ID consulted; changed antibiotics to Unasyn only -TTE obtained shows thickening of the aortic valve -Follow blood culture results to decide regarding TEE, ID following -Blood culture x 2 are negative to date -ID recommends  can switch to Linezolid 600mg  po bid through 8/15 ( strep mitis/oralis) and amoxicillin 1 g po tid ( proteus and enterococcus) through 8/10 for treatment of secondary polymicrobial bacteremia when discharged    Acute osteomyelitis of calcaneum (HCC), s/p right BKA [name] normal last Dec, suspect this is a small vessel ulcer or neuropathic. Orthopedics has been consulted; surgery postponed for today.   S/p right bka -Continue morphine 30 mg p.o. every 12 hours - continue Amitryptiline 100 mg po q hs for pain On amitryptyline 100mg  po HS, low risk for serotonin syndrome, can consider holding while on linezolid at discharge  Hypersomnolence -significantly improved after reducing MS contin to 15 mg po bid -Initially started  MS Contin 30 mg p.o. twice daily post op -changed  MS Contin to 15 mg p.o. twice daily due to somnolence -Continue oxycodone as needed   Obesity (BMI 30-39.9) BMI 30.7   Chronic pain - Continue home morphine   GAD (generalized anxiety disorder) - Continue Xanax   CAD in native artery - Continue Crestor   Essential hypertension BP controlled - Continue amlodipine    HLD (hyperlipidemia) - Continue Crestor   Type 2 diabetes mellitus with peripheral neuropathy (HCC) Glucose well controlled - Continue aspart - Continue amitriptyline for diabetes related neuropathy - Hold metformin    Medications     amitriptyline  100 mg Oral QHS   amLODipine  10 mg Oral Daily   vitamin C  1,000 mg Oral Daily   docusate sodium  100 mg Oral Daily   enoxaparin (LOVENOX) injection  40 mg Subcutaneous Q24H   feeding supplement  237 mL Oral BID BM   insulin aspart  0-6 Units Subcutaneous Q4H   morphine  15 mg Oral Q12H   multivitamin with minerals  1 tablet Oral Daily   nutrition supplement (JUVEN)  1 packet Oral BID BM   nutrition supplement (JUVEN)  1 packet Oral BID BM   pantoprazole  40 mg Oral Daily   polyethylene glycol  17 g Oral Daily   rosuvastatin  20 mg Oral Daily   senna-docusate  1 tablet Oral QHS   zinc sulfate  220 mg Oral Daily     Data Reviewed:   CBG:  Recent Labs  Lab 06/05/23 1639 06/05/23 2100 06/06/23 0003 06/06/23 0435 06/06/23 0736  GLUCAP 232* 152* 183* 168* 179*    SpO2: 95 %    Vitals:   06/05/23 1445 06/05/23 2100 06/06/23 0434 06/06/23 0738  BP: 103/60 (!) 145/78 (!) 142/79 119/70  Pulse: 97 85 85 97  Resp: 17 18 18 17   Temp: 98.2 F (36.8 C) 97.6 F (36.4 C) 97.8 F (36.6 C) 98.6 F (37 C)  TempSrc: Oral Oral Oral  Oral  SpO2: 98% 98% 94% 95%  Weight:      Height:          Data Reviewed:  Basic Metabolic Panel: Recent Labs  Lab 05/31/23 0617 06/01/23 0444 06/04/23 0757  NA 133* 134* 133*  K 4.1 4.2 4.1  CL 103 100 104  CO2 20* 24 21*  GLUCOSE 97 132* 157*  BUN 12 22* 21*  CREATININE 0.74 0.88 0.82  CALCIUM 8.5* 8.7* 8.6*    CBC: Recent Labs  Lab 05/31/23 0617 06/01/23 0444  WBC 5.8 6.4  HGB 12.7* 13.1  HCT 38.1* 39.4  MCV 91.1 93.4  PLT 246 289    LFT No results for input(s): "AST", "ALT", "ALKPHOS", "BILITOT", "PROT", "ALBUMIN" in the last 168 hours.    Antibiotics: Anti-infectives (From admission, onward)    Start     Dose/Rate Route Frequency Ordered Stop   06/03/23 1200  ceFAZolin (ANCEF) IVPB 2g/100 mL premix  Status:  Discontinued        2 g 200 mL/hr over 30 Minutes Intravenous Every 8 hours 06/03/23 1105 06/03/23 1211   06/03/23 0800  ceFAZolin (ANCEF) IVPB 2g/100 mL premix        2 g 200 mL/hr over 30 Minutes Intravenous On call to O.R. 06/03/23 1610 06/03/23 0925   06/03/23 0749  ceFAZolin (ANCEF) 2-4 GM/100ML-% IVPB       Note to Pharmacy: Aquilla Hacker M: cabinet override      06/03/23 0749 06/03/23 0931   06/03/23 0715  vancomycin (VANCOREADY) IVPB 1500 mg/300 mL  Status:  Discontinued        1,500 mg 150 mL/hr over 120 Minutes Intravenous On call to O.R. 06/03/23 0620 06/03/23 0638   06/02/23 1315  vancomycin (VANCOREADY) IVPB 1250 mg/250 mL        1,250 mg 166.7 mL/hr over 90 Minutes Intravenous 2 times daily 06/02/23 1222     05/31/23 1800  Ampicillin-Sulbactam (UNASYN) 3 g in sodium chloride 0.9 % 100 mL IVPB        3 g 200 mL/hr over 30 Minutes Intravenous Every 6 hours 05/31/23 1224     05/30/23 1000  vancomycin (VANCOREADY) IVPB 1250 mg/250 mL  Status:  Discontinued        1,250 mg 166.7 mL/hr over 90 Minutes Intravenous Every 12 hours 05/29/23 2047 05/31/23 1224   05/29/23 2100  vancomycin (VANCOCIN) 2,500 mg in sodium chloride 0.9 % 500 mL IVPB        2,500 mg 262.5 mL/hr over 120 Minutes Intravenous  Once 05/29/23 2047 05/29/23 2335   05/29/23 2000  cefTRIAXone (ROCEPHIN) 2 g in sodium chloride 0.9 % 100 mL IVPB  Status:  Discontinued        2 g 200 mL/hr over 30 Minutes Intravenous Every 24 hours 05/29/23 1953 05/31/23 1224   05/29/23 2000  metroNIDAZOLE (FLAGYL) tablet 500 mg  Status:  Discontinued        500 mg Oral Every 12 hours 05/29/23 1953 05/31/23 1224   05/29/23 1930  piperacillin-tazobactam (ZOSYN) IVPB 3.375 g  Status:  Discontinued        3.375 g 100 mL/hr over 30 Minutes Intravenous  Once  05/29/23 1929 05/29/23 1953        DVT prophylaxis: SCDs  Code Status: Full code  Family Communication: No family at bedside   CONSULTS orthopedics, infectious disease   Subjective   Hypersomnolence has improved after cutting down MS contin. C/o phantom foot pain bilaterally   Objective  Physical Examination:  General-appears in no acute distress Heart-S1-S2, regular, no murmur auscultated Lungs-clear to auscultation bilaterally, no wheezing or crackles auscultated Abdomen-soft, nontender, no organomegaly Extremities-s/p right BKA Neuro-alert, oriented x3, no focal deficit noted   Status is: Inpatient:         [name]   [facility] If 7PM-7AM, please contact night-coverage at [email or web address], Office  [phone]   06/06/2023, 9:32 AM  LOS: 8 days

## 2023-06-06 NOTE — Progress Notes (Signed)
Pharmacy Antibiotic Note  Eugene Diaz is a 57 y.o. male admitted on 05/29/2023 with E.faecalis / P.mirabilis / S.mitis/oralis bacteremia and osteomyelitis.  Pharmacy has been consulted for vancomycin dosing.  Vanc peak 32, trough 11 >> AUC 495, at goal.  Plan: This patient's current antibiotics will be continued without adjustments.  Height: 6' 2.02" (188 cm) Weight: 108.6 kg (239 lb 6.7 oz) IBW/kg (Calculated) : 82.24  Temp (24hrs), Avg:98.2 F (36.8 C), Min:97.6 F (36.4 C), Max:98.6 F (37 C)  Recent Labs  Lab 05/31/23 0617 06/01/23 0444 06/04/23 0757 06/05/23 2132 06/05/23 2332  WBC 5.8 6.4  --   --   --   CREATININE 0.74 0.88 0.82  --   --   VANCOTROUGH  --   --   --  11*  --   VANCOPEAK  --   --   --   --  32    Estimated Creatinine Clearance: 128.9 mL/min (by C-G formula based on SCr of 0.82 mg/dL).    No Known Allergies  Antimicrobials this admission: Rocephin 7/29 >>7/31 Flagyl 7/29 >>7/31 Unasyn 7/31>> Vanc 7/29 >>7/31, 8/2>>  Microbiology results: 7/29 Bcx 1/2: Enterococcus, Proteus (Both Amp-S) 7/29 Bcx 1/2 S. Oralis (S- only vanc) 7/31 Bcx NGTD x 5d   Thank you for allowing pharmacy to be a part of this patient's care.  Vernard Gambles, PharmD, BCPS  06/06/2023 12:56 AM

## 2023-06-06 NOTE — Consult Note (Addendum)
Physical Medicine and Rehabilitation Consult Reason for Consult: Rehab Referring Physician: Dr. Sharl Ma   HPI: Eugene Diaz is a 57 y.o. male with past medical history of diabetes mellitus type 2, hypertension, CAD, depression, left BKA 2021, alcohol abuse, right foot osteomyelitis s/p TMA on 04/28/2023.  Patient was followed by ID after his right foot amputation and was treated with doxycycline and Augmentin.  He developed wound dehiscence and purulent drainage.  When patient was seen by podiatry 05/24/2023 he was advised to go to the ER.  He later developed fevers and presented to the ER on 7/29.  Patient was treated for sepsis due to Enterococcus and Proteus bacteremia related to osteomyelitis of his right foot.  He was followed by ID with adjustments to his antibiotics.  Patient was continued on amitriptyline for polyneuropathy due to diabetes mellitus. He was seen by Dr. Eual Fines and had right below the knee amputation with application of wound VAC on 06/03/2023.  Patient's MS Contin was decreased from 30 mg twice a day to 15 mg twice a day due to somnolence.  PDMP review indicates he takes morphine sulfate ER 30 mg twice a day at home.  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  He also has pain in his distal residual limb.   He is on IV antibiotics per ID recommendation with linezolid through 8/15 and amoxicillin through 8/10.  Patient was seen by physical therapy and occupational and found to have significant functional deficits.  Patient lives in a one-story home with ramp entrance.  He plans to stay with his daughter initially who can provide 24-hour assistance.  She lives in a [place] home with 3-4 steps to enter.  Review of Systems  Constitutional:  Negative for chills and fever.  HENT:  Negative for congestion.   Respiratory:  Negative for shortness of breath.   Cardiovascular:  Negative for chest pain.  Gastrointestinal:  Negative for abdominal pain, constipation,  diarrhea, nausea and vomiting.  Genitourinary: Negative.   Musculoskeletal:  Positive for joint pain.  Neurological:  Positive for weakness.   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) 05/23/2022   Depression    Diabetic neuropathy (HCC)    Edema of both lower extremities    Essential tremor    neurologist-- dr patel--- due to alcohol abuse   GERD (gastroesophageal reflux disease)    History of cellulitis 2020   left lower leg;   recurrent 03-25-2020   History of esophageal stricture    s/p  dilatation 02-13-2018   History of osteomyelitis 2020   left great toe     Hypercholesterolemia    Hypertension    followed by pcp  (nuclear stress test 03-11-2014 low risk w/ no ischemia, ef 65%   Normocytic anemia    followed by pcp   (03-18-2020 had transfusion's 02/ 2021)   Osteomyelitis of great toe of right foot (HCC) 08/18/2021   Peripheral vascular disease (HCC)    Severe depression (HCC) 06/09/2021   Status post incision and drainage followed by Dr Samuella Cota and pcp   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   (03-17-2020  pt states most of incision is healed with exception an  area that is still draining,  daily dressing change),  foot is red but not warm to the touch and has swelling but has improved)   Suicidal ideation 06/09/2021   Syncope and collapse 06/07/2022   Syncope, near 06/20/2022   Thrombocytopenia (HCC)    chronic   Type 2 diabetes mellitus (HCC)    followed by pcp---    (03-18-2020  pt stated does not check blood sugar)   Vaccine counseling 08/29/2022   Past Surgical History:  Procedure Laterality Date   AMPUTATION Left 10/16/2019   Procedure: Left partial second ray resection; placement of antibiotic beads;  Surgeon: Park Liter, DPM;  Location: [facility];  Service: Podiatry;  Laterality: Left;   AMPUTATION Left 11/13/2019   Procedure: Left Midfoot Amputation - Transmetatarsal vs. Lisfranc; Placement antibiotic beads;  Surgeon: Park Liter, DPM;  Location: [facility];  Service: Podiatry;  Laterality: Left;   AMPUTATION Left 12/21/2019   Procedure: Chopart Amputation left foot;  Surgeon: Park Liter, DPM;  Location: [facility] OR;  Service: Podiatry;  Laterality: Left;   AMPUTATION Left 06/24/2020   Procedure: LEFT BELOW KNEE AMPUTATION;  Surgeon: Nadara Mustard, MD;  Location: [facility] OR;  Service: Orthopedics;  Laterality: Left;   AMPUTATION Right 03/29/2021   Procedure: AMPUTATION RAY;  Surgeon: Park Liter, DPM;  Location: [facility] OR;  Service: Podiatry;  Laterality: Right;   AMPUTATION Right 04/28/2023   Procedure: RIGHT PARTIAL FOOT AMPUTATION;  Surgeon: Felecia Shelling, DPM;  Location: [facility] OR;  Service: Podiatry;  Laterality: Right;   AMPUTATION Right 06/03/2023   Procedure: RIGHT BELOW KNEE AMPUTATION;  Surgeon: Nadara Mustard, MD;  Location: [facility] OR;  Service: Orthopedics;  Laterality: Right;   AMPUTATION TOE Left 08/14/2019   Procedure: AMPUTATION GREAT TOE;  Surgeon: Park Liter, DPM;  Location: [facility];  Service: Podiatry;  Laterality: Left;   APPLICATION OF WOUND VAC Left 12/21/2019   Procedure: Application Of Wound Vac;  Surgeon: Park Liter, DPM;  [facility]: [facility];  Service: Podiatry;  Laterality: Left;   APPLICATION OF WOUND VAC Right 06/03/2023   Procedure: APPLICATION OF WOUND VAC;  Surgeon: Nadara Mustard, MD;  Location: [facility];  Service: Orthopedics;  Laterality: Right;   BIOPSY  02/13/2018   Procedure: BIOPSY;  Surgeon: West Bali, MD;  Location: [facility];  Service: Endoscopy;;  transverse colon biopsy, gastric biopsy   BONE BIOPSY Right 04/28/2023   Procedure: BONE BIOPSY;  Surgeon: Felecia Shelling, DPM;  Location: [facility];  Service: Podiatry;  Laterality: Right;   COLONOSCOPY WITH PROPOFOL N/A 02/13/2018   Procedure:  COLONOSCOPY WITH PROPOFOL;  Surgeon: West Bali, MD;  [facility]: [facility];  Service: Endoscopy;  Laterality: N/A;  11:15am   ESOPHAGOGASTRODUODENOSCOPY (EGD) WITH PROPOFOL N/A 02/13/2018   Procedure: ESOPHAGOGASTRODUODENOSCOPY (EGD) WITH PROPOFOL;  Surgeon: West Bali, MD;  [facility]: [facility];  Service: Endoscopy;  Laterality: N/A;   I & D EXTREMITY Left 07/16/2019   Procedure: Insicion  AND DEBRIDEMENT EXTREMITY;  Surgeon: Park Liter, DPM;  [facility]: [facility];  Service: Podiatry;  Laterality: Left;   I & D EXTREMITY Left 06/22/2020   Procedure: IRRIGATION AND DEBRIDEMENT EXTREMITY, left foot and ankle;  Surgeon: Park Liter, DPM;  [facility]: [facility];  Service: Podiatry;  Laterality: Left;   I & D EXTREMITY Right 03/29/2021   Procedure: IRRIGATION AND DEBRIDEMENT EXTREMITY;  Surgeon: Park Liter, DPM;  [facility]: [facility];  Service: Podiatry;  Laterality: Right;   INCISION AND DRAINAGE OF WOUND Left 10/13/2019   Procedure: IRRIGATION AND DEBRIDEMENT WOUND OF LEFT FOOT AND FIRST METATARSAL RESECTION;  Surgeon: Park Liter, DPM;  Location: [facility];  Service: Podiatry;  Laterality: Left;   IRRIGATION AND DEBRIDEMENT FOOT Left 11/11/2019   Procedure: Left Foot Wound Irrigation and Debridement;  Surgeon: Park Liter, DPM;  Location: [facility];  Service: Podiatry;  Laterality: Left;   Left arm     Left arm repair (tendon and artery)   PERCUTANEOUS PINNING  07/16/2019   Procedure: Open Reduction Percutaneous Pinning Extremity;  Surgeon: Park Liter, DPM;  [facility]: [facility];  Service: Podiatry;;   PILONIDAL CYST EXCISION N/A 08/08/2014   Procedure: CYST EXCISION PILONIDAL EXTENSIVE;  Surgeon: Dalia Heading, MD;  Location: [facility];  Service: General;  Laterality: N/A;   POLYPECTOMY  02/13/2018   Procedure: POLYPECTOMY;  Surgeon: West Bali, MD;  [facility];  Service: Endoscopy;;  transverse colon polyp hs, rectal polyps times 2   SAVORY DILATION N/A 02/13/2018    Procedure: SAVORY DILATION;  Surgeon: West Bali, MD;  [facility]: [facility];  Service: Endoscopy;  Laterality: N/A;   TENDON LENGTHENING Right 12/04/2021   Procedure: TENDON LENGTHENING;  Surgeon: Asencion Islam, DPM;  [facility]: [facility];  Service: Podiatry;  Laterality: Right;   TRANSMETATARSAL AMPUTATION Right 12/04/2021   Procedure: TRANSMETATARSAL AMPUTATION;  Surgeon: Asencion Islam, DPM;  [facility]: [facility];  Service: Podiatry;  Laterality: Right;   WOUND DEBRIDEMENT Left 09/04/2019   Procedure: DEBRIDEMENT WOUND WITH COMPLEX  REPAIR OF DEHISCENCE;  Surgeon: Park Liter, DPM;  Location: [facility];  Service: Podiatry;  Laterality: Left;  Leave patient on stretcher   WOUND DEBRIDEMENT Left 10/16/2019   Procedure: Left foot wound debridement and closure;  Surgeon: Park Liter, DPM;  Location: [facility];  Service: Podiatry;  Laterality: Left;   WOUND DEBRIDEMENT Left 12/18/2019   Procedure: LEFT FOOT DEBRIDEMENT WITH PARTIAL INCISION OF INFECTED BONE;  Surgeon: Park Liter, DPM;  [facility]: [facility];  Service: Podiatry;  Laterality: Left;  LEFT FOOT DEBRIDEMENT WITH PARTIAL INCISION OF INFECTED BONE   WOUND DEBRIDEMENT Right 04/01/2021   Procedure: Right foot wound debridement and irrigation and closure, bone biopsy;  Surgeon: Park Liter, DPM;  [facility]: [facility];  Service: Podiatry;  Laterality: Right;   Family History  Problem Relation Age of Onset   Heart attack Mother        Living, 4   Cancer Father        Deceased   Healthy Brother    Healthy Sister    Colon cancer Neg Hx    Colon polyps Neg Hx    Social History:  reports that he has been smoking cigars. He has never used smokeless tobacco. He reports current alcohol use of about 50.0 standard drinks of alcohol per week. He reports that he does not currently use drugs after having used the following drugs: Marijuana. Allergies: No Known Allergies Medications Prior to Admission  Medication Sig Dispense  Refill   acetaminophen (TYLENOL) 325 MG tablet Take 2 tablets (650 mg total) by mouth every 6 (six) hours as needed for mild pain.     albuterol (VENTOLIN HFA) 108 (90 Base) MCG/ACT inhaler Inhale 1-2 puffs into the lungs every 4 (four) hours as needed for wheezing or shortness of breath.     ALPRAZolam (XANAX) 1 MG tablet Take 1 mg by mouth 4 (four)  times daily.     amitriptyline (ELAVIL) 100 MG tablet Take 100 mg by mouth at bedtime.     amLODipine (NORVASC) 10 MG tablet Take 1 tablet (10 mg total) by mouth daily. 90 tablet 3   diclofenac Sodium (VOLTAREN) 1 % GEL Apply 4 g topically 4 (four) times daily as needed (Apply to painful area over left upper front chest.). 50 g 0   folic acid (FOLVITE) 1 MG tablet Take 1 tablet (1 mg total) by mouth daily. 30 tablet 0   metFORMIN (GLUCOPHAGE) 500 MG tablet Take 500 mg by mouth 2 (two) times daily.     morphine (MS CONTIN) 30 MG 12 hr tablet Take 30 mg by mouth every 12 (twelve) hours.     Multiple Vitamin (MULTIVITAMIN WITH MINERALS) TABS tablet Take 1 tablet by mouth daily.     oxycodone (ROXICODONE) 30 MG immediate release tablet Take 30 mg by mouth 3 (three) times daily.     rosuvastatin (CRESTOR) 20 MG tablet Take 1 tablet (20 mg total) by mouth daily. 90 tablet 3   senna-docusate (SENOKOT-S) 8.6-50 MG tablet Take 1 tablet by mouth at bedtime as needed for mild constipation.     thiamine 100 MG tablet Take 1 tablet (100 mg total) by mouth daily. 30 tablet 0   amoxicillin-clavulanate (AUGMENTIN) 875-125 MG tablet Take 1 tablet by mouth every 12 (twelve) hours. (Patient not taking: Reported on 05/29/2023) 28 tablet 0   doxycycline (VIBRA-TABS) 100 MG tablet Take 1 tablet (100 mg total) by mouth every 12 (twelve) hours. (Patient not taking: Reported on 05/29/2023) 14 tablet 0    Home: Home Living Family/patient expects to be discharged to:: Private residence Living Arrangements: Alone Available Help at Discharge: Family, Available  PRN/intermittently Type of Home: Mobile home Home Access: Ramped entrance Home Layout: One level Bathroom Shower/Tub: Engineer, manufacturing systems: Standard Bathroom Accessibility: Yes Home Equipment: Agricultural consultant (2 wheels), Crutches, BSC/3in1, Wheelchair - manual, [employer] - single point, Other (comment), Shower seat Additional Comments: may go to daughter's house, has 4-5 steps to enter, one level, tub shower  Functional History: Prior Function Prior Level of Function : Independent/Modified Independent Mobility Comments: working on power WC, using crutches ADLs Comments: independent, not driving, reports not eating much Functional Status:  Mobility: Bed Mobility Overal bed mobility: Needs Assistance Bed Mobility: Supine to Sit, Sit to Supine Supine to sit: Min guard, HOB elevated Sit to supine: Min guard, HOB elevated General bed mobility comments: use of HOB and rails Transfers Overall transfer level: Needs assistance Equipment used: Rolling walker (2 wheels) Transfers: Sit to/from Stand, Bed to chair/wheelchair/BSC Sit to Stand: Max assist, +2 physical assistance, From elevated surface Bed to/from chair/wheelchair/BSC transfer type:: Lateral/scoot transfer  Lateral/Scoot Transfers: Min assist General transfer comment: maxA of 2 with RW and significantly elevated bed, unable to tolerate for more than 5 seconds. lateral scoot along EOB with minA      ADL: ADL Overall ADL's : Needs assistance/impaired Eating/Feeding: Set up, Sitting Grooming: Set up, Sitting Upper Body Bathing: Min guard, Sitting Lower Body Bathing: Maximal assistance, Sit to/from stand, Sitting/lateral leans Upper Body Dressing : Set up, Sitting Lower Body Dressing: Maximal assistance, Sit to/from stand, Sitting/lateral leans Toilet Transfer: Maximal assistance, +2 for safety/equipment, +2 for physical assistance Toileting- Clothing Manipulation and Hygiene: Maximal assistance, Sitting/lateral lean, Sit  to/from stand Toileting - Clothing Manipulation Details (indicate cue type and reason): for peri care Functional mobility during ADLs: Maximal assistance, +2 for physical assistance, +2 for  safety/equipment, Cueing for safety, Cueing for sequencing, Rolling walker (2 wheels) General ADL Comments: Patient presenting with R leg pain, decreased activity tolerance, and need for increased assist to complete all aspects of care.  Cognition: Cognition Overall Cognitive Status: Within Functional Limits for tasks assessed Orientation Level: Oriented X4 Cognition Arousal/Alertness: Awake/alert Behavior During Therapy: WFL for tasks assessed/performed Overall Cognitive Status: Within Functional Limits for tasks assessed  Blood pressure 119/70, pulse 97, temperature 98.6 F (37 C), temperature source Oral, resp. rate 17, height 6' 2.02" (1.88 m), weight 108.6 kg, SpO2 95%. Physical Exam   General: Alert and oriented x 3, No apparent distress HEENT: Head is normocephalic, atraumatic, PERRLA, EOMI, sclera anicteric, oral mucosa pink and moist, dentition decreased Heart: Reg rate and rhythm. No murmurs rubs or gallops Chest: CTA bilaterally without wheezes, rales, or rhonchi; no distress Abdomen: Soft, non-tender, non-distended, bowel sounds positive. Extremities: Left BKA Right BKA with limb protector and wound VAC in place.  No drainage noted in the VAC canister Psych: Pt's affect is appropriate. Pt is cooperative, pleasant Skin: Clean and intact without signs of breakdown Neuro: Follows commands, cranial nerves II through XII grossly intact, normal speech and language Strength 5 out of 5 in bilateral upper extremities Strength 5 out of 5 in left hip flexion knee extension Able to lift right lower extremity to gravity Sensory exam normal for light touch and pain in all 4 limbs. No limb ataxia or cerebellar signs. No abnormal tone appreciated.  Musculoskeletal: No abnormal tone noted.  No joint  swelling noted  Left BKA prosthetic at bedside  Results for orders placed or performed during the hospital encounter of 05/29/23 (from the past 24 hour(s))  Glucose, capillary     Status: Abnormal   Collection Time: 06/05/23  4:39 PM  Result Value Ref Range   Glucose-Capillary 232 (H) 70 - 99 mg/dL   Comment 1 Notify RN   Glucose, capillary     Status: Abnormal   Collection Time: 06/05/23  9:00 PM  Result Value Ref Range   Glucose-Capillary 152 (H) 70 - 99 mg/dL  Vancomycin, trough     Status: Abnormal   Collection Time: 06/05/23  9:32 PM  Result Value Ref Range   Vancomycin Tr 11 (L) 15 - 20 ug/mL  Vancomycin, peak     Status: None   Collection Time: 06/05/23 11:32 PM  Result Value Ref Range   Vancomycin Pk 32 30 - 40 ug/mL  Glucose, capillary     Status: Abnormal   Collection Time: 06/06/23 12:03 AM  Result Value Ref Range   Glucose-Capillary 183 (H) 70 - 99 mg/dL  Glucose, capillary     Status: Abnormal   Collection Time: 06/06/23  4:35 AM  Result Value Ref Range   Glucose-Capillary 168 (H) 70 - 99 mg/dL  Glucose, capillary     Status: Abnormal   Collection Time: 06/06/23  7:36 AM  Result Value Ref Range   Glucose-Capillary 179 (H) 70 - 99 mg/dL  Glucose, capillary     Status: Abnormal   Collection Time: 06/06/23 12:18 PM  Result Value Ref Range   Glucose-Capillary 197 (H) 70 - 99 mg/dL   No results found.  Assessment/Plan: Diagnosis: Right below the knee amputation due to osteomyelitis Does the need for close, 24 hr/day medical supervision in concert with the patient's rehab needs make it unreasonable for this patient to be served in a less intensive setting? Yes Co-Morbidities requiring supervision/potential complications:  -Hypersomnolence, obesity, GAD, CAD, essential  hypertension, diabetes mellitus type 2 with polyneuropathy, prior left BKA, chronic pain, phantom pain Due to bladder management, bowel management, safety, skin/wound care, disease management,  medication administration, pain management, and patient education, does the patient require 24 hr/day rehab nursing? Yes Does the patient require coordinated care of a physician, rehab nurse, therapy disciplines of PT and OT to address physical and functional deficits in the context of the above medical diagnosis(es)? Yes Addressing deficits in the following areas: balance, endurance, locomotion, strength, transferring, bowel/bladder control, bathing, dressing, feeding, grooming, toileting, and psychosocial support Can the patient actively participate in an intensive therapy program of at least 3 hrs of therapy per day at least 5 days per week? Yes The potential for patient to make measurable gains while on inpatient rehab is excellent Anticipated functional outcomes upon discharge from inpatient rehab are modified independent and supervision  with PT, modified independent and supervision with OT, n/a with SLP. Estimated rehab length of stay to reach the above functional goals is: 12-14 days Anticipated discharge destination: Home Overall Rehab/Functional Prognosis: good  POST ACUTE RECOMMENDATIONS: This patient's condition is appropriate for continued rehabilitative care in the following setting: CIR Patient has agreed to participate in recommended program. Yes Note that insurance prior authorization may be required for reimbursement for recommended care.  Comment: Mr. Klimas has right BKA in addition to prior left BKA.  He also has comorbidities of obesity, chronic pain, anxiety, CAD, diabetes mellitus with peripheral neuropathy.  I think you be a very good candidate for CIR.   MEDICAL RECOMMENDATIONS: Consider gabapentin 100 mg 3 times daily to help with phantom pain.  Could tolerate up as tolerated.   I have personally performed a face to face diagnostic evaluation of this patient. Additionally, I have examined the patient's medical record including any pertinent labs and radiographic  images. If the physician assistant has documented in this note, I have reviewed and edited or otherwise concur with the physician assistant's documentation.  Thanks,  Fanny Dance, MD 06/06/2023

## 2023-06-06 NOTE — Progress Notes (Signed)
Nutrition Follow-up  DOCUMENTATION CODES:   Not applicable  INTERVENTION:   Continue 1 packet Juven BID, each packet provides 95 calories, 2.5 grams of protein (collagen), and 9.8 grams of carbohydrate (3 grams sugar); also contains 7 grams of L-arginine and L-glutamine, 300 mg vitamin C, 15 mg vitamin E, 1.2 mcg vitamin B-12, 9.5 mg zinc, 200 mg calcium, and 1.5 g  Calcium Beta-hydroxy-Beta-methylbutyrate to support wound healing   Continue Ensure Plus High Protein po BID, each supplement provides 350 kcal and 20 grams of protein.   NUTRITION DIAGNOSIS:   Increased nutrient needs related to wound healing as evidenced by estimated needs. -ongoing   GOAL:   Patient will meet greater than or equal to 90% of their needs -progressing   MONITOR:   PO intake, Supplement acceptance, Labs, Weight trends, Skin  REASON FOR ASSESSMENT:   Consult Wound healing  ASSESSMENT:   57 y.o. male presented to the ED with worsening pain and drainage from R foot surgical site. S/p TMA on 04/28/23. PMH includes T2DM, L BKA, HLD, CAD, HTN, GERD, EtOH abuse, anxiety, and depression. Pt admitted with R foot osteomyelitis.  MD speaking with patient at time of visit. RD reviewed chart.  Patient is POD 3 R BKA + wound vac  He has been eating 100% of all his meals. His weight is stable. Per MAR, it appears he is consuming all ordered ONS.   He plans to dc home to his house or his daughter's house.   Labs: CBG 197, Na 133 Meds: unasyn, vitamin C, colace, ensure BID, insulin, MVI, juven BID, miralax, crestor, senokot, vancoready, zinc sulfate   Wt: admit 239#, current 239#  I/O's:  +1.6 L    Diet Order:   Diet Order             Diet Carb Modified Fluid consistency: Thin; Room service appropriate? Yes  Diet effective now                   EDUCATION NEEDS:   Education needs have been addressed  Skin:  Skin Assessment: Reviewed RN Assessment  Last BM:  8/5  Height:   Ht Readings  from Last 1 Encounters:  06/03/23 6' 2.02" (1.88 m)    Weight:   Wt Readings from Last 1 Encounters:  06/03/23 108.6 kg    Ideal Body Weight:  86.4 kg  BMI:  Body mass index is 30.73 kg/m.  Estimated Nutritional Needs:   Kcal:  2200-2400  Protein:  110-130 grams  Fluid:  >/= 2 L   Leodis Rains, RDN, LDN  Clinical Nutrition

## 2023-06-06 NOTE — Plan of Care (Signed)
  Problem: Education: Goal: Knowledge of General Education information will improve Description Including pain rating scale, medication(s)/side effects and non-pharmacologic comfort measures Outcome: Progressing   Problem: Nutrition: Goal: Adequate nutrition will be maintained Outcome: Progressing   Problem: Elimination: Goal: Will not experience complications related to bowel motility Outcome: Progressing   Problem: Pain Managment: Goal: General experience of comfort will improve Outcome: Progressing

## 2023-06-06 NOTE — Progress Notes (Signed)
Inpatient Rehab Coordinator Note:  I spoke with patient over the phone to discuss CIR recommendations and goals/expectations of CIR stay.  We reviewed 3 hrs/day of therapy, physician follow up, and average length of stay 2 weeks (dependent upon progress) with goals of supervision.  Pt reports he plans to d/c to his daughters home after rehab if he's not able to d/c to his own home at mod I level.  We discussed need for prior authorization and I've asked our PMR MD to weigh in to assist with our case.  I will open insurance once they have a chance to document.   Estill Dooms, PT, DPT Admissions Coordinator [phone] 06/06/23  12:03 PM

## 2023-06-07 DIAGNOSIS — M869 Osteomyelitis, unspecified: Secondary | ICD-10-CM | POA: Diagnosis not present

## 2023-06-07 LAB — GLUCOSE, CAPILLARY
Glucose-Capillary: 160 mg/dL — ABNORMAL HIGH (ref 70–99)
Glucose-Capillary: 163 mg/dL — ABNORMAL HIGH (ref 70–99)
Glucose-Capillary: 143 mg/dL — ABNORMAL HIGH (ref 70–99)
Glucose-Capillary: 260 mg/dL — ABNORMAL HIGH (ref 70–99)
Glucose-Capillary: 247 mg/dL — ABNORMAL HIGH (ref 70–99)

## 2023-06-07 NOTE — PMR Pre-admission (Signed)
PMR Admission Coordinator Pre-Admission Assessment  Patient: Eugene Diaz is an 57 y.o., male MRN: [id number] DOB: 09-16-1966 Height: 6' 2.02" (188 cm) Weight: 108.6 kg              Insurance Information HMO: yes    PPO:      PCP:      IPA:      80/20:      OTHER:  PRIMARY: [employer]      Policy#: [id number]       Subscriber: pt CM Name: Livia Snellen      Phone#: [phone]     Fax#: [phone] Pre-Cert#: [id number] auth for CIR via fax from [place] with updates due to her at fax listed above on 8/15      Employer:  Benefits:  Phone #: [phone]     Name:  Eff. Date: 04/01/23     Deduct: $240 (met)      Out of Pocket Max: [phone] (met [phone])      Life Max: n/a  CIR: $2080/admit      SNF: 20 full days Outpatient: 80%     Co-Pay: 20% Home Health: 100%      Co-Pay:  DME: 80%     Co-Pay: 20% Providers:  SECONDARY: Medicaid [place]      Policy#: [id number]      Phone#:   Financial Counselor:       Phone#:   The "Data Collection Information Summary" for patients in Inpatient Rehabilitation Facilities with attached "Privacy Act Statement-Health Care Records" was provided and verbally reviewed with: Patient and Family  Emergency Contact Information Contact Information     Name Relation Home Work [place] Relative   [phone]      Other Contacts     Name Relation Home Work Mobile   Luke Mother [phone]     JAIVEER, TRABERT   [phone]   [facility] Sister [phone]  [phone]      Current Medical History  Patient Admitting Diagnosis: R BKA  History of Present Illness: Pt is a 57 y/o male with PMH of L BKA, DM, CAD, depression, anxiety, ETOH use, and PVD admitted to [facility] on 7/29 with R foot pain and draining wound.  He was sent to the ER from his podiatrist office.  Labs notable for some mild hyponatremia, but otherwise normal.  Plain radiographs of RLE concerning for osteomyelitis.  ID consulted and recommended  vancomycin, rocephin, and flagyl as well as TTE.  TTE showed thickening of aortic valve.  Repeat blood cultures negative.  Podiatry and orthopedics consulted.  Podiatry with nothing to add.  Dr. Lajoyce Corners recommend BKA which pt underwent on 8/3.  Hospital course sepsis due to enterococcus and proteus bacteremia, abx narrowed to unasyn and vancomycin.  ID recommended that pt can be switched to liezolid through 8/15 and amoxicillin through 8/10 when ready for d/c.  Pt with wound vac per ortho.  Pain management per hospitalist, decreased ms contin from 30 mg BID to 15 mg BID.  Therapy evaluations completed and pt was recommended for CIR.       Patient's medical record from [facility] has been reviewed by the rehabilitation admission coordinator and physician.  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) 05/23/2022   Depression    Diabetic neuropathy (HCC)    Edema of both lower extremities    Essential tremor    neurologist-- dr patel--- due to alcohol abuse   GERD (gastroesophageal reflux disease)    History of cellulitis 2020   left lower leg;   recurrent 03-25-2020   History of esophageal stricture    s/p  dilatation 02-13-2018   History of osteomyelitis 2020   left great toe     Hypercholesterolemia    Hypertension    followed by pcp  (nuclear stress test 03-11-2014 low risk w/ no ischemia, ef 65%   Normocytic anemia    followed by pcp   (03-18-2020 had transfusion's 02/ 2021)   Osteomyelitis of great toe of right foot (HCC) 08/18/2021   Peripheral vascular disease (HCC)    Severe depression (HCC) 06/09/2021   Status post incision and drainage followed by Dr Samuella Cota and pcp   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 Yes  Family History  family history includes Cancer in his father; Healthy in his brother and sister; Heart attack in his mother.   Current Medications   Current Facility-Administered Medications:    acetaminophen (TYLENOL) tablet 650 mg, 650 mg, Oral, Q6H PRN, 650 mg at 06/08/23 2052 **OR** acetaminophen (TYLENOL) suppository 650 mg, 650 mg, Rectal, Q6H PRN, Nadara Mustard, MD   acetaminophen (TYLENOL) tablet 325-650 mg, 325-650 mg, Oral, Q6H PRN, Nadara Mustard, MD   ALPRAZolam Prudy Feeler) tablet 1 mg, 1 mg, Oral, QID PRN, Nadara Mustard, MD, 1 mg at 06/08/23 2053   alum & mag hydroxide-simeth (MAALOX/MYLANTA) 200-200-20 MG/5ML suspension 15-30 mL, 15-30 mL, Oral, Q2H PRN, Nadara Mustard, MD   amitriptyline (ELAVIL) tablet 100 mg, 100 mg, Oral, QHS, Sharl Ma, Gagan S, MD, 100 mg at 06/08/23 2052   amLODipine (NORVASC) tablet 10 mg, 10 mg, Oral, Daily, Nadara Mustard, MD, 10 mg at 06/09/23 0916   Ampicillin-Sulbactam (UNASYN) 3 g in sodium chloride 0.9 % 100 mL IVPB, 3 g, Intravenous, Q6H, Nadara Mustard, MD, Last Rate: 200 mL/hr at 06/09/23 0532, 3 g at 06/09/23 0532   ascorbic acid (VITAMIN C) tablet 1,000 mg, 1,000 mg, Oral, Daily, Nadara Mustard, MD, 1,000 mg at 06/09/23 0916   bisacodyl (DULCOLAX) EC tablet 5 mg, 5 mg, Oral, Daily PRN, Nadara Mustard, MD   docusate sodium (COLACE) capsule 100 mg, 100 mg, Oral, Daily, Aldean Baker V, MD, 100 mg at 06/09/23 0916   enoxaparin (LOVENOX) injection 40  mg, 40 mg, Subcutaneous, Q24H, Sharl Ma, Gagan S, MD, 40 mg at 06/08/23 1302   feeding supplement (ENSURE ENLIVE / ENSURE PLUS) liquid 237 mL, 237 mL, Oral, BID BM, Nadara Mustard, MD, 237 mL at 06/09/23 0918   guaiFENesin-dextromethorphan (ROBITUSSIN DM) 100-10 MG/5ML  syrup 15 mL, 15 mL, Oral, Q4H PRN, Nadara Mustard, MD   hydrALAZINE (APRESOLINE) injection 5 mg, 5 mg, Intravenous, Q20 Min PRN, Nadara Mustard, MD   HYDROmorphone (DILAUDID) injection 0.5-1 mg, 0.5-1 mg, Intravenous, Q4H PRN, Nadara Mustard, MD, 1 mg at 06/06/23 2322   insulin aspart (novoLOG) injection 0-6 Units, 0-6 Units, Subcutaneous, Q4H, Nadara Mustard, MD, 1 Units at 06/09/23 0900   labetalol (NORMODYNE) injection 10 mg, 10 mg, Intravenous, Q10 min PRN, Nadara Mustard, MD   magnesium citrate solution 1 Bottle, 1 Bottle, Oral, Once PRN, Nadara Mustard, MD   magnesium sulfate IVPB 2 g 50 mL, 2 g, Intravenous, Daily PRN, Nadara Mustard, MD   metoprolol tartrate (LOPRESSOR) injection 2-5 mg, 2-5 mg, Intravenous, Q2H PRN, Nadara Mustard, MD   morphine (MS CONTIN) 12 hr tablet 15 mg, 15 mg, Oral, Q12H, Sharl Ma, Sarina Ill, MD, 15 mg at 06/09/23 6045   multivitamin with minerals tablet 1 tablet, 1 tablet, Oral, Daily, Nadara Mustard, MD, 1 tablet at 06/09/23 4098   nutrition supplement (JUVEN) (JUVEN) powder packet 1 packet, 1 packet, Oral, BID BM, Nadara Mustard, MD, 1 packet at 06/09/23 0918   ondansetron (ZOFRAN) tablet 4 mg, 4 mg, Oral, Q6H PRN **OR** ondansetron (ZOFRAN) injection 4 mg, 4 mg, Intravenous, Q6H PRN, Nadara Mustard, MD   ondansetron [facility]) injection 4 mg, 4 mg, Intravenous, Q6H PRN, Nadara Mustard, MD   oxyCODONE (Oxy IR/ROXICODONE) immediate release tablet 10-15 mg, 10-15 mg, Oral, Q4H PRN, Nadara Mustard, MD, 15 mg at 06/07/23 2311   oxyCODONE (Oxy IR/ROXICODONE) immediate release tablet 30 mg, 30 mg, Oral, Q8H PRN, Nadara Mustard, MD, 30 mg at 06/08/23 2051   oxyCODONE (Oxy IR/ROXICODONE) immediate release tablet 5-10  mg, 5-10 mg, Oral, Q4H PRN, Nadara Mustard, MD, 10 mg at 06/07/23 1718   pantoprazole (PROTONIX) EC tablet 40 mg, 40 mg, Oral, Daily, Nadara Mustard, MD, 40 mg at 06/09/23 0916   phenol (CHLORASEPTIC) mouth spray 1 spray, 1 spray, Mouth/Throat, PRN, Nadara Mustard, MD   polyethylene glycol (MIRALAX / GLYCOLAX) packet 17 g, 17 g, Oral, Daily, Nadara Mustard, MD, 17 g at 05/31/23 1130   polyethylene glycol (MIRALAX / GLYCOLAX) packet 17 g, 17 g, Oral, Daily PRN, Nadara Mustard, MD   potassium chloride SA (KLOR-CON M) CR tablet 20-40 mEq, 20-40 mEq, Oral, Daily PRN, Nadara Mustard, MD   rosuvastatin (CRESTOR) tablet 20 mg, 20 mg, Oral, Daily, Nadara Mustard, MD, 20 mg at 06/09/23 1191   senna-docusate (Senokot-S) tablet 1 tablet, 1 tablet, Oral, QHS PRN, Nadara Mustard, MD, 1 tablet at 06/03/23 2235   senna-docusate (Senokot-S) tablet 1 tablet, 1 tablet, Oral, QHS, Nadara Mustard, MD, 1 tablet at 06/07/23 2053   vancomycin (VANCOREADY) IVPB 1250 mg/250 mL, 1,250 mg, Intravenous, BID, Nadara Mustard, MD, Last Rate: 166.7 mL/hr at 06/09/23 0924, 1,250 mg at 06/09/23 4782   zinc sulfate capsule 220 mg, 220 mg, Oral, Daily, Nadara Mustard, MD, 220 mg at 06/09/23 9562  Patients Current Diet:  Diet Order             Diet - low sodium heart healthy           Diet Carb Modified           Diet Carb Modified Fluid consistency: Thin; Room service appropriate? Yes  Diet effective now  Precautions / Restrictions Precautions Precautions: Fall Precaution Comments: chronic L BKA, has prosthetic, new R BKA Restrictions Weight Bearing Restrictions: Yes RLE Weight Bearing: Non weight bearing Other Position/Activity Restrictions: RLE limb protector   Has the patient had 2 or more falls or a fall with injury in the past year?Yes  Prior Activity Level Household: household ambulatory with crutches and prosthesis on LLE.  Prior Functional Level Prior Function Prior Level of  Function : Independent/Modified Independent Mobility Comments: working on power WC, using crutches ADLs Comments: independent, not driving, reports not eating much  Self Care: Did the patient need help bathing, dressing, using the toilet or eating?  Independent  Indoor Mobility: Did the patient need assistance with walking from room to room (with or without device)? Independent  Stairs: Did the patient need assistance with internal or external stairs (with or without device)? Independent  Functional Cognition: Did the patient need help planning regular tasks such as shopping or remembering to take medications? Independent  Patient Information Are you of Hispanic, Latino/a,or Spanish origin?: A. No, not of Hispanic, Latino/a, or Spanish origin What is your race?: A. White Do you need or want an interpreter to communicate with a doctor or health care staff?: 0. No  Patient's Response To:  Health Literacy and Transportation Is the patient able to respond to health literacy and transportation needs?: Yes Health Literacy - How often do you need to have someone help you when you read instructions, pamphlets, or other written material from your doctor or pharmacy?: Never In the past 12 months, has lack of transportation kept you from medical appointments or from getting medications?: No In the past 12 months, has lack of transportation kept you from meetings, work, or from getting things needed for daily living?: No  Home Assistive Devices / Equipment Home Assistive Devices/Equipment: Medical laboratory scientific officer (specify quad or straight) Home Equipment: Agricultural consultant (2 wheels), Crutches, BSC/3in1, Wheelchair - manual, Cane - single point, Other (comment), Shower seat  Prior Device Use: Indicate devices/aids used by the patient prior to current illness, exacerbation or injury? Orthotics/Prosthetics and crutches  Current Functional Level Cognition  Overall Cognitive Status: Within Functional Limits for tasks  assessed Orientation Level: Oriented X4 General Comments: pt with poor safety awareness but anticipate this is baseline personality.    Extremity Assessment (includes Sensation/Coordination)  Upper Extremity Assessment: Overall WFL for tasks assessed  Lower Extremity Assessment: Defer to PT evaluation RLE Deficits / Details: limited hip flexion, suspect due to pain. limited active movement at hip and minimal activation of quad for knee extension RLE: Unable to fully assess due to pain RLE Sensation: WNL RLE Coordination: WNL LLE Deficits / Details: chronic L BKA, full ROM, able to maintain against mod pressure    ADLs  Overall ADL's : Needs assistance/impaired Eating/Feeding: Set up, Sitting Grooming: Set up, Sitting Upper Body Bathing: Set up, Bed level Lower Body Bathing: Set up, Sitting/lateral leans, Bed level Upper Body Dressing : Set up, Sitting Lower Body Dressing: Maximal assistance, Sit to/from stand, Sitting/lateral leans Toilet Transfer: Moderate assistance, +2 for safety/equipment, BSC/3in1, Rolling walker (2 wheels) Toileting- Clothing Manipulation and Hygiene: Maximal assistance, Sitting/lateral lean, Sit to/from stand Toileting - Clothing Manipulation Details (indicate cue type and reason): for peri care Functional mobility during ADLs: Maximal assistance, +2 for physical assistance, +2 for safety/equipment, Cueing for safety, Cueing for sequencing, Rolling walker (2 wheels) General ADL Comments: Pt able to perform bathing prior to session in bed with set up. Pt completed UB/LB dressing with set  up. Pt able to perform transfer to recliner with mod A, increased time using RW.    Mobility  Overal bed mobility: Modified Independent Bed Mobility: Supine to Sit, Sit to Supine Supine to sit: Supervision, HOB elevated, Used rails Sit to supine: Supervision, HOB elevated, Used rails General bed mobility comments: mod I    Transfers  Overall transfer level: Needs  assistance Equipment used: Rolling walker (2 wheels) Transfers: Sit to/from Stand, Bed to chair/wheelchair/BSC Sit to Stand: Min assist, +2 safety/equipment, From elevated surface Bed to/from chair/wheelchair/BSC transfer type:: Stand pivot Stand pivot transfers: Mod assist  Lateral/Scoot Transfers: Min assist General transfer comment: Pt able to perform STS with min A from elevated surface, transferred to recliner with use of RW, mod A x2 due to unsteadiness and safety, needs further training on t/f to recliner    Ambulation / Gait / Stairs / Engineer, drilling / Balance Dynamic Sitting Balance Sitting balance - Comments: Pt able to lean L/R as needed, able to don/doff RLE limb protector, and L prosthetic with set up Balance Overall balance assessment: Needs assistance Sitting-balance support: Feet supported Sitting balance-Leahy Scale: Good Sitting balance - Comments: Pt able to lean L/R as needed, able to don/doff RLE limb protector, and L prosthetic with set up Postural control: Posterior lean Standing balance support: During functional activity, Reliant on assistive device for balance Standing balance-Leahy Scale: Poor Standing balance comment: reliant on crutches or RW for support    Special needs/care consideration Continuous Drip IV  vancomycin and unasyn, Wound Vac yes RLE, Skin surgical incision to R residual limb, and Diabetic management yes     Previous Home Environment (from acute therapy documentation) Living Arrangements: Alone Available Help at Discharge: Family, Available PRN/intermittently Type of Home: Mobile home Home Layout: One level Home Access: Ramped entrance Bathroom Shower/Tub: Engineer, manufacturing systems: Standard Bathroom Accessibility: Yes Home Care Services: No Additional Comments: may go to daughter's house, has 4-5 steps to enter, one level, tub shower  Discharge Living Setting Plans for Discharge Living Setting: Lives with  (comment) (daughter) Type of Home at Discharge: Mobile home Discharge Home Layout: One level Discharge Home Access: Stairs to enter Entrance Stairs-Number of Steps: 4-5 Discharge Bathroom Shower/Tub: Tub/shower unit Discharge Bathroom Toilet: Standard Discharge Bathroom Accessibility: Yes How Accessible: Accessible via wheelchair Does the patient have any problems obtaining your medications?: No  Social/Family/Support Systems Anticipated Caregiver: Pt's plan is to d/c to daughters home ([place]) if he does not reach mod I level.  If he is mod I or intermittent mod I, his friend Earley Abide can check on him and she is a CNA [phone]) Anticipated Caregiver's Contact Information: see above Ability/Limitations of Caregiver: supervision only Caregiver Availability: 24/7 Discharge Plan Discussed with Primary Caregiver: Yes Is Caregiver In Agreement with Plan?: Yes Does Caregiver/Family have Issues with Lodging/Transportation while Pt is in Rehab?: No   Goals Patient/Family Goal for Rehab: PT/OT mod I w/c level, SLP n/a Expected length of stay: 9-12 days Additional Information: Discharge plan: if able to reach mod I level can d/c home, otherwise will plan to stay with his daughter at discharge who does not work. Pt/Family Agrees to Admission and willing to participate: Yes Program Orientation Provided & Reviewed with Pt/Caregiver Including Roles  & Responsibilities: Yes  Barriers to Discharge: Insurance for SNF coverage   Decrease burden of Care through IP rehab admission: n/a   Possible need for SNF placement upon discharge: Not anticipated.  Pt  with mod I goals, but able to stay with family at discharge if he does not reach mod I level.    Patient Condition: This patient's condition remains as documented in the consult dated 06/06/23, in which the Rehabilitation Physician determined and documented that the patient's condition is appropriate for intensive rehabilitative care in an  inpatient rehabilitation facility. Will admit to inpatient rehab today.  Preadmission Screen Completed By:  Stephania Fragmin, PT, DPT 06/09/2023 12:19 PM ______________________________________________________________________   Discussed status with Dr. Natale Lay on 06/09/23 at 12:19 PM  and received approval for admission today.  Admission Coordinator:  Stephania Fragmin, PT, DPT time 12:19 PM Dorna Bloom 06/09/23

## 2023-06-07 NOTE — Progress Notes (Signed)
Inpatient Rehab Admissions Coordinator:   Awaiting determination from [facility] regarding CIR prior auth request.  Will follow.   Estill Dooms, PT, DPT Admissions Coordinator [phone] 06/07/23  10:49 AM

## 2023-06-07 NOTE — Progress Notes (Signed)
Physical Therapy Treatment Patient Details Name: Eugene Diaz MRN: [id number] DOB: Aug 15, 1966 Today's Date: 06/07/2023   History of Present Illness Pt is a 57 y.o. M who presents 7/29 with R foot pain and wound, no s/p R BKA on 8/3. PMH includes: L BKA, DM II, CAD, depression, anxiety, alcohol abuse, GERD, and PVD.    PT Comments  Patient resting in bed and agreeable to therapy visit with min encouragement. Pt completed repeated sit<>stands with Stedy for safety and improved UE support with stedy cross bar. Pt completed 6x sit<>stand with Min +2 for safety. Pt Lt LE fatiguing and declined OOB transfer following repeated stands. On return to supine pt completed Rt LE exercises for hip and quad strengthening. Pt was able to don Rt LE limb protector and Lt prosthesis with supervision only. He has poor safety awareness repeatedly requesting to transfer using bil axillary crutches despite new Rt AKA and education on high fall risk with decreased UE support of crutches. Will continue to education and progress mobility as able.     If plan is discharge home, recommend the following: A lot of help with walking and/or transfers;A lot of help with bathing/dressing/bathroom;Assistance with cooking/housework;Assist for transportation;Help with stairs or ramp for entrance   Can travel by private vehicle        Equipment Recommendations       Recommendations for Other Services Rehab consult     Precautions / Restrictions Precautions Precautions: Fall Precaution Comments: chronic L BKA, has prosthetic, new R BKA Restrictions Weight Bearing Restrictions: Yes RLE Weight Bearing: Non weight bearing Other Position/Activity Restrictions: RLE limb protector     Mobility  Bed Mobility Overal bed mobility: Needs Assistance Bed Mobility: Supine to Sit, Sit to Supine     Supine to sit: Supervision, HOB elevated, Used rails Sit to supine: Supervision, HOB elevated, Used rails   General bed mobility  comments: sup for safety, pt determined to complete without assist    Transfers Overall transfer level: Needs assistance Equipment used:  Antony Salmon) Transfers: Sit to/from Stand Sit to Stand: Via Financial trader, Min assist, +2 safety/equipment Antony Salmon)           General transfer comment: Stedy utilized for sit<>stand with pt Lt prosthtic in place and limb protector on Rt LE. EOB significcantly elevated for positioning with stedy platform and for pt height. Min +2 assist provided for rise from EOB to stand on platform. Pt completed 6 reps and c/o slight Lt knee pain due to muscle fatigue.    Ambulation/Gait                   Stairs             Wheelchair Mobility     Tilt Bed    Modified Rankin (Stroke Patients Only)       Balance Overall balance assessment: Needs assistance Sitting-balance support: Single extremity supported, Feet unsupported Sitting balance-Leahy Scale: Fair Sitting balance - Comments: minG with UE support Postural control: Posterior lean Standing balance support: Bilateral upper extremity supported, During functional activity, Reliant on assistive device for balance Standing balance-Leahy Scale: Poor Standing balance comment: reliant on external support                            Cognition Arousal: Alert Behavior During Therapy: WFL for tasks assessed/performed Overall Cognitive Status: Within Functional Limits for tasks assessed  General Comments: pt with poor safety awareness but anticipate this is baseline personality.        Exercises Amputee Exercises Quad Sets: AROM, 10 reps, Supine, Right, Limitations Quad Sets Limitations: SAQ Gluteal Sets: AROM, 10 reps, Supine, Right Hip ABduction/ADduction: AROM, 10 reps, Supine, Right    General Comments        Pertinent Vitals/Pain Pain Assessment Pain Assessment: Faces Faces Pain Scale: Hurts even more Pain Location: Lt  knee; RLE incision Pain Descriptors / Indicators: Discomfort, Grimacing, Sharp Pain Intervention(s): Limited activity within patient's tolerance, Monitored during session, Repositioned    Home Living                          Prior Function            PT Goals (current goals can now be found in the care plan section) Acute Rehab PT Goals PT Goal Formulation: With patient Time For Goal Achievement: 06/18/23 Potential to Achieve Goals: Good Progress towards PT goals: Progressing toward goals    Frequency    Min 1X/week      PT Plan      Co-evaluation              AM-PAC PT "6 Clicks" Mobility   Outcome Measure  Help needed turning from your back to your side while in a flat bed without using bedrails?: None Help needed moving from lying on your back to sitting on the side of a flat bed without using bedrails?: A Little Help needed moving to and from a bed to a chair (including a wheelchair)?: A Lot Help needed standing up from a chair using your arms (e.g., wheelchair or bedside chair)?: A Lot Help needed to walk in hospital room?: Total Help needed climbing 3-5 steps with a railing? : Total 6 Click Score: 13    End of Session Equipment Utilized During Treatment: Gait belt Activity Tolerance: Patient tolerated treatment well Patient left: in bed;with call bell/phone within reach;with bed alarm set Nurse Communication: Mobility status;Patient requests pain meds PT Visit Diagnosis: Other abnormalities of gait and mobility (R26.89);Unsteadiness on feet (R26.81);Muscle weakness (generalized) (M62.81);Pain Pain - Right/Left: Right Pain - part of body: Leg     Time: 1500-1535 PT Time Calculation (min) (ACUTE ONLY): 35 min  Charges:    $Therapeutic Exercise: 8-22 mins $Therapeutic Activity: 8-22 mins PT General Charges $$ ACUTE PT VISIT: 1 Visit                     Wynn Maudlin, DPT Acute Rehabilitation Services Office [phone]  06/07/23  5:11 PM

## 2023-06-07 NOTE — Progress Notes (Signed)
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  He was started on broad-spectrum IV antibiotics.  He was subsequently found to have Proteus and Enterococcus bacteremia.  ID was consulted.  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 Lajoyce Corners.  Currently on IV Unasyn and vancomycin.  ID recommended that patient can be switched to linezolid through 06/15/2023 and amoxicillin through 06/10/2023 when ready for discharge.   Assessment & Plan:   Sepsis: Present on admission Enterococcus and Proteus bacteremia Acute right calcaneal osteomyelitis -Repeat blood cultures have been negative so far.  2D echo showed thickening of aortic valve.  No need for TEE.  Currently afebrile and hemodynamically stable.   -Currently on IV Unasyn and vancomycin as per ID.  ID recommended that patient can be switched to linezolid through 06/15/2023 and amoxicillin through 06/10/2023 when ready for discharge. -underwent right BKA on 06/03/2023 by Dr. Lajoyce Corners.  Wound and wound VAC care and pain management as per orthopedics recommendations. -PT recommended CIR placement.  CIR following.  Currently medically stable for discharge to CIR  Hypersomnolence -Much improved after MS Contin dose was decreased from 30 mg twice a day to 15 mg twice a day.  Chronic pain with opiate dependence -MS Contin plan as above.  Continue as needed oxycodone.  Continue bowel regimen  GAD  -Continue as needed Xanax  Essential hypertension CAD in native artery Hyperlipidemia -Blood pressure currently controlled.  Continue amlodipine and rosuvastatin  Diabetes mellitus type 2 with peripheral neuropathy -Carb modified diet.  Continue CBGs with SSI.   Continue amitriptyline  Hyponatremia -No recent labs.  Obesity -Outpatient follow-up  DVT prophylaxis: Lovenox Code Status: Full Family Communication: None at bedside Disposition Plan: Status is: Inpatient Remains inpatient appropriate because: Of severity of illness.  Need for CIR placement.  Currently medically stable for discharge to CIR.  Consultants: Orthopedics/ID/podiatry  Procedures: As above  Antimicrobials:  Anti-infectives (From admission, onward)    Start     Dose/Rate Route Frequency Ordered Stop   06/03/23 1200  ceFAZolin (ANCEF) IVPB 2g/100 mL premix  Status:  Discontinued        2 g 200 mL/hr over 30 Minutes Intravenous Every 8 hours 06/03/23 1105 06/03/23 1211   06/03/23 0800  ceFAZolin (ANCEF) IVPB 2g/100 mL premix        2 g 200 mL/hr over 30 Minutes Intravenous On call to O.R. 06/03/23 8841 06/03/23 0925   06/03/23 0749  ceFAZolin (ANCEF) 2-4 GM/100ML-% IVPB       Note to Pharmacy: Aquilla Hacker M: cabinet override      06/03/23 0749 06/03/23 0931   06/03/23 0715  vancomycin (VANCOREADY) IVPB 1500 mg/300 mL  Status:  Discontinued        1,500 mg 150 mL/hr over 120 Minutes Intravenous On call to O.R. 06/03/23 6606 06/03/23 0638   06/02/23 1315  vancomycin (VANCOREADY) IVPB 1250 mg/250 mL        1,250 mg 166.7 mL/hr over 90 Minutes Intravenous 2 times daily 06/02/23 1222     05/31/23 1800  Ampicillin-Sulbactam (UNASYN) 3 g in sodium chloride 0.9 % 100 mL IVPB        3 g 200 mL/hr over 30 Minutes Intravenous Every 6 hours 05/31/23 1224  05/30/23 1000  vancomycin (VANCOREADY) IVPB 1250 mg/250 mL  Status:  Discontinued        1,250 mg 166.7 mL/hr over 90 Minutes Intravenous Every 12 hours 05/29/23 2047 05/31/23 1224   05/29/23 2100  vancomycin (VANCOCIN) 2,500 mg in sodium chloride 0.9 % 500 mL IVPB        2,500 mg 262.5 mL/hr over 120 Minutes Intravenous  Once 05/29/23 2047 05/29/23 2335   05/29/23 2000  cefTRIAXone (ROCEPHIN) 2 g in sodium chloride  0.9 % 100 mL IVPB  Status:  Discontinued        2 g 200 mL/hr over 30 Minutes Intravenous Every 24 hours 05/29/23 1953 05/31/23 1224   05/29/23 2000  metroNIDAZOLE (FLAGYL) tablet 500 mg  Status:  Discontinued        500 mg Oral Every 12 hours 05/29/23 1953 05/31/23 1224   05/29/23 1930  piperacillin-tazobactam (ZOSYN) IVPB 3.375 g  Status:  Discontinued        3.375 g 100 mL/hr over 30 Minutes Intravenous  Once 05/29/23 1929 05/29/23 1953        Subjective: Patient seen and examined at bedside.  Denies overnight fever, nausea, vomiting.  Had bowel movement yesterday.  Complains of intermittent lower extremity pain.  Objective: Vitals:   06/06/23 1622 06/06/23 1958 06/07/23 0339 06/07/23 0749  BP: 134/83 115/61 113/70 103/66  Pulse: 84 83 71 78  Resp: 17 17 17 16   Temp: 98.1 F (36.7 C) 97.9 F (36.6 C) 97.8 F (36.6 C) 98.9 F (37.2 C)  TempSrc: Oral Oral Oral Oral  SpO2: 97% 100% 98% 100%  Weight:      Height:        Intake/Output Summary (Last 24 hours) at 06/07/2023 1009 Last data filed at 06/06/2023 1958 Gross per 24 hour  Intake 240 ml  Output 400 ml  Net -160 ml   Filed Weights   05/29/23 1556 05/29/23 2357 06/03/23 0801  Weight: 108.9 kg 108.6 kg 108.6 kg    Examination:  General exam: Appears calm and comfortable.  On room air.  Looks chronically ill and deconditioned. Respiratory system: Bilateral decreased breath sounds at bases with some scattered crackles Cardiovascular system: S1 & S2 heard, Rate controlled Gastrointestinal system: Abdomen is obese, nondistended, soft and nontender. Normal bowel sounds heard. Extremities: Left BKA; right BKA with dressing and drain present Central nervous system: Alert and oriented. No focal neurological deficits. Moving extremities Skin: No rashes, lesions or ulcers Psychiatry: Affect is mostly flat.  Not agitated.   Data Reviewed: I have personally reviewed following labs and imaging studies  CBC: Recent Labs   Lab 06/01/23 0444  WBC 6.4  HGB 13.1  HCT 39.4  MCV 93.4  PLT 289   Basic Metabolic Panel: Recent Labs  Lab 06/01/23 0444 06/04/23 0757  NA 134* 133*  K 4.2 4.1  CL 100 104  CO2 24 21*  GLUCOSE 132* 157*  BUN 22* 21*  CREATININE 0.88 0.82  CALCIUM 8.7* 8.6*   GFR: Estimated Creatinine Clearance: 128.9 mL/min (by C-G formula based on SCr of 0.82 mg/dL). Liver Function Tests: No results for input(s): "AST", "ALT", "ALKPHOS", "BILITOT", "PROT", "ALBUMIN" in the last 168 hours. No results for input(s): "LIPASE", "AMYLASE" in the last 168 hours. No results for input(s): "AMMONIA" in the last 168 hours. Coagulation Profile: No results for input(s): "INR", "PROTIME" in the last 168 hours. Cardiac Enzymes: No results for input(s): "CKTOTAL", "CKMB", "CKMBINDEX", "TROPONINI" in the last 168 hours. BNP (last 3  results) No results for input(s): "PROBNP" in the last 8760 hours. HbA1C: No results for input(s): "HGBA1C" in the last 72 hours. CBG: Recent Labs  Lab 06/06/23 1623 06/06/23 1959 06/06/23 2313 06/07/23 0339 06/07/23 0839  GLUCAP 215* 220* 178* 160* 163*   Lipid Profile: No results for input(s): "CHOL", "HDL", "LDLCALC", "TRIG", "CHOLHDL", "LDLDIRECT" in the last 72 hours. Thyroid Function Tests: No results for input(s): "TSH", "T4TOTAL", "FREET4", "T3FREE", "THYROIDAB" in the last 72 hours. Anemia Panel: No results for input(s): "VITAMINB12", "FOLATE", "FERRITIN", "TIBC", "IRON", "RETICCTPCT" in the last 72 hours. Sepsis Labs: No results for input(s): "PROCALCITON", "LATICACIDVEN" in the last 168 hours.  Recent Results (from the past 240 hour(s))  Culture, blood (Routine X 2) w Reflex to ID Panel     Status: Abnormal   Collection Time: 05/29/23  7:48 PM   Specimen: BLOOD  Result Value Ref Range Status   Specimen Description BLOOD SITE NOT SPECIFIED  Final   Special Requests   Final    BOTTLES DRAWN AEROBIC AND ANAEROBIC Blood Culture results may not be  optimal due to an excessive volume of blood received in culture bottles   Culture  Setup Time   Final    GRAM NEGATIVE RODS GRAM POSITIVE COCCI IN CLUSTERS IN BOTH AEROBIC AND ANAEROBIC BOTTLES CRITICAL RESULT CALLED TO, READ BACK BY AND VERIFIED WITH: PHARMD E.SINCLAIR AT 1206 ON 05/30/2023 BY T.SAAD. Performed at [facility], [address] [place], [place] [address]    Culture PROTEUS MIRABILIS ENTEROCOCCUS FAECALIS  (A)  Final   Report Status 06/03/2023 FINAL  Final   Organism ID, Bacteria PROTEUS MIRABILIS  Final   Organism ID, Bacteria ENTEROCOCCUS FAECALIS  Final      Susceptibility   Enterococcus faecalis - MIC*    AMPICILLIN <=2 SENSITIVE Sensitive     VANCOMYCIN 1 SENSITIVE Sensitive     GENTAMICIN SYNERGY RESISTANT Resistant     * ENTEROCOCCUS FAECALIS   Proteus mirabilis - MIC*    AMPICILLIN <=2 SENSITIVE Sensitive     CEFEPIME <=0.12 SENSITIVE Sensitive     CEFTAZIDIME <=1 SENSITIVE Sensitive     CEFTRIAXONE <=0.25 SENSITIVE Sensitive     CIPROFLOXACIN <=0.25 SENSITIVE Sensitive     GENTAMICIN <=1 SENSITIVE Sensitive     IMIPENEM 2 SENSITIVE Sensitive     TRIMETH/SULFA <=20 SENSITIVE Sensitive     AMPICILLIN/SULBACTAM <=2 SENSITIVE Sensitive     PIP/TAZO <=4 SENSITIVE Sensitive     * PROTEUS MIRABILIS  Blood Culture ID Panel (Reflexed)     Status: Abnormal   Collection Time: 05/29/23  7:48 PM  Result Value Ref Range Status   Enterococcus faecalis DETECTED (A) NOT DETECTED Final    Comment: CRITICAL RESULT CALLED TO, READ BACK BY AND VERIFIED WITH: PHARMD E.SINCLAIR AT 1206 ON 05/30/2023 BY T.SAAD.    Enterococcus Faecium NOT DETECTED NOT DETECTED Final   Listeria monocytogenes NOT DETECTED NOT DETECTED Final   Staphylococcus species NOT DETECTED NOT DETECTED Final   Staphylococcus aureus (BCID) NOT DETECTED NOT DETECTED Final   Staphylococcus epidermidis NOT DETECTED NOT DETECTED Final   Staphylococcus lugdunensis NOT DETECTED NOT DETECTED Final    Streptococcus species NOT DETECTED NOT DETECTED Final   Streptococcus agalactiae NOT DETECTED NOT DETECTED Final   Streptococcus pneumoniae NOT DETECTED NOT DETECTED Final   Streptococcus pyogenes NOT DETECTED NOT DETECTED Final   A.calcoaceticus-baumannii NOT DETECTED NOT DETECTED Final   Bacteroides fragilis NOT DETECTED NOT DETECTED Final   Enterobacterales DETECTED (A) NOT DETECTED  Final    Comment: Enterobacterales represent a large order of gram negative bacteria, not a single organism. CRITICAL RESULT CALLED TO, READ BACK BY AND VERIFIED WITH: PHARMD E.SINCLAIR AT 1206 ON 05/30/2023 BY T.SAAD.    Enterobacter cloacae complex NOT DETECTED NOT DETECTED Final   Escherichia coli NOT DETECTED NOT DETECTED Final   Klebsiella aerogenes NOT DETECTED NOT DETECTED Final   Klebsiella oxytoca NOT DETECTED NOT DETECTED Final   Klebsiella pneumoniae NOT DETECTED NOT DETECTED Final   Proteus species DETECTED (A) NOT DETECTED Final    Comment: CRITICAL RESULT CALLED TO, READ BACK BY AND VERIFIED WITH: PHARMD E.SINCLAIR AT 1206 ON 05/30/2023 BY T.SAAD.    Salmonella species NOT DETECTED NOT DETECTED Final   Serratia marcescens NOT DETECTED NOT DETECTED Final   Haemophilus influenzae NOT DETECTED NOT DETECTED Final   Neisseria meningitidis NOT DETECTED NOT DETECTED Final   Pseudomonas aeruginosa NOT DETECTED NOT DETECTED Final   Stenotrophomonas maltophilia NOT DETECTED NOT DETECTED Final   Candida albicans NOT DETECTED NOT DETECTED Final   Candida auris NOT DETECTED NOT DETECTED Final   Candida glabrata NOT DETECTED NOT DETECTED Final   Candida krusei NOT DETECTED NOT DETECTED Final   Candida parapsilosis NOT DETECTED NOT DETECTED Final   Candida tropicalis NOT DETECTED NOT DETECTED Final   Cryptococcus neoformans/gattii NOT DETECTED NOT DETECTED Final   CTX-M ESBL NOT DETECTED NOT DETECTED Final   Carbapenem resistance IMP NOT DETECTED NOT DETECTED Final   Carbapenem resistance KPC NOT  DETECTED NOT DETECTED Final   Carbapenem resistance NDM NOT DETECTED NOT DETECTED Final   Carbapenem resist OXA 48 LIKE NOT DETECTED NOT DETECTED Final   Vancomycin resistance NOT DETECTED NOT DETECTED Final   Carbapenem resistance VIM NOT DETECTED NOT DETECTED Final    Comment: Performed at [facility], [address], [place], [place] [address]  Culture, blood (Routine X 2) w Reflex to ID Panel     Status: Abnormal   Collection Time: 05/29/23  8:04 PM   Specimen: BLOOD  Result Value Ref Range Status   Specimen Description BLOOD SITE NOT SPECIFIED  Final   Special Requests   Final    BOTTLES DRAWN AEROBIC AND ANAEROBIC Blood Culture adequate volume   Culture  Setup Time   Final    GRAM POSITIVE COCCI IN CHAINS ANAEROBIC BOTTLE ONLY CRITICAL VALUE NOTED.  VALUE IS CONSISTENT WITH PREVIOUSLY REPORTED AND CALLED VALUE. GRAM NEGATIVE RODS AEROBIC BOTTLE ONLY VALUE IS CONSISTENT WITH PREVIOUSLY REPORT AND CALLED VALUE    Culture (A)  Final    STREPTOCOCCUS MITIS/ORALIS PROTEUS MIRABILIS SUSCEPTIBILITIES PERFORMED ON PREVIOUS CULTURE WITHIN THE LAST 5 DAYS. Performed at [facility], [address], [place], [place] [address]    Report Status 06/02/2023 FINAL  Final   Organism ID, Bacteria STREPTOCOCCUS MITIS/ORALIS  Final      Susceptibility   Streptococcus mitis/oralis - MIC*    TETRACYCLINE >=16 RESISTANT Resistant     VANCOMYCIN 0.5 SENSITIVE Sensitive     CLINDAMYCIN >=1 RESISTANT Resistant     PENICILLIN Value in next row Resistant      RESISTANT4    CEFTRIAXONE Value in next row Resistant      RESISTANT4    * STREPTOCOCCUS MITIS/ORALIS  Surgical PCR screen     Status: Abnormal   Collection Time: 05/29/23 10:55 PM   Specimen: Nasal Mucosa; Nasal Swab  Result Value Ref Range Status   MRSA, PCR POSITIVE (A) NEGATIVE Final  Comment: RESULT CALLED TO, READ BACK BY AND VERIFIED WITH: S LYN RN 05/30/23 @ 0106 BY AB    Staphylococcus aureus POSITIVE (A) NEGATIVE  Final    Comment: (NOTE) The Xpert SA Assay (FDA approved for NASAL specimens in patients 24 years of age and older), is one component of a comprehensive surveillance program. It is not intended to diagnose infection nor to guide or monitor treatment. Performed at [facility], [address], [place], [place] [address]   Culture, blood (Routine X 2) w Reflex to ID Panel     Status: None   Collection Time: 05/31/23  6:17 AM   Specimen: BLOOD  Result Value Ref Range Status   Specimen Description BLOOD LEFT ANTECUBITAL  Final   Special Requests   Final    BOTTLES DRAWN AEROBIC AND ANAEROBIC Blood Culture adequate volume   Culture   Final    NO GROWTH 5 DAYS Performed at [facility], [address] [place], [place] [address]    Report Status 06/05/2023 FINAL  Final  Culture, blood (Routine X 2) w Reflex to ID Panel     Status: None   Collection Time: 05/31/23  6:17 AM   Specimen: BLOOD LEFT HAND  Result Value Ref Range Status   Specimen Description BLOOD LEFT HAND  Final   Special Requests   Final    BOTTLES DRAWN AEROBIC AND ANAEROBIC Blood Culture adequate volume   Culture   Final    NO GROWTH 5 DAYS Performed at [facility], [address] [place], [place] [address]    Report Status 06/05/2023 FINAL  Final         Radiology Studies: No results found.      Scheduled Meds:  amitriptyline  100 mg Oral QHS   amLODipine  10 mg Oral Daily   vitamin C  1,000 mg Oral Daily   docusate sodium  100 mg Oral Daily   enoxaparin (LOVENOX) injection  40 mg Subcutaneous Q24H   feeding supplement  237 mL Oral BID BM   insulin aspart  0-6 Units Subcutaneous Q4H   morphine  15 mg Oral Q12H   multivitamin with minerals  1 tablet Oral Daily   nutrition supplement (JUVEN)  1 packet Oral BID BM   pantoprazole  40 mg Oral Daily   polyethylene glycol  17 g Oral Daily   rosuvastatin  20 mg Oral Daily   senna-docusate  1 tablet Oral QHS   zinc sulfate  220 mg  Oral Daily   Continuous Infusions:  sodium chloride 10 mL/hr at 06/06/23 0825   ampicillin-sulbactam (UNASYN) IV 3 g (06/07/23 0543)   magnesium sulfate bolus IVPB     vancomycin 1,250 mg (06/07/23 0944)          Glade Lloyd, MD [facility] 06/07/2023, 10:09 AM

## 2023-06-08 DIAGNOSIS — M869 Osteomyelitis, unspecified: Secondary | ICD-10-CM | POA: Diagnosis not present

## 2023-06-08 LAB — GLUCOSE, CAPILLARY
Glucose-Capillary: 107 mg/dL — ABNORMAL HIGH (ref 70–99)
Glucose-Capillary: 121 mg/dL — ABNORMAL HIGH (ref 70–99)
Glucose-Capillary: 200 mg/dL — ABNORMAL HIGH (ref 70–99)
Glucose-Capillary: 206 mg/dL — ABNORMAL HIGH (ref 70–99)
Glucose-Capillary: 208 mg/dL — ABNORMAL HIGH (ref 70–99)
Glucose-Capillary: 209 mg/dL — ABNORMAL HIGH (ref 70–99)

## 2023-06-08 NOTE — Progress Notes (Signed)
PROGRESS NOTE    Eugene Diaz  UJW:[id number] DOB: 1966/05/10 DOA: 05/29/2023 PCP: Elfredia Nevins, MD   Brief Narrative:  57 year old male with history of obesity, diabetes mellitus type 2, hypertension, left BKA and recent right [place] amputation presented with fever, chills and drainage/dehiscence of his recent right foot surgical wound.  He was started on broad-spectrum IV antibiotics.  He was subsequently found to have Proteus and Enterococcus bacteremia.  ID was consulted.  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 Lajoyce Corners.  Currently on IV Unasyn and vancomycin.  ID recommended that patient can be switched to linezolid through 06/15/2023 and amoxicillin through 06/10/2023 when ready for discharge.   Assessment & Plan:   Sepsis: Present on admission Enterococcus and Proteus bacteremia Acute right calcaneal osteomyelitis -Repeat blood cultures have been negative so far.  2D echo showed thickening of aortic valve.  No need for TEE.  Currently afebrile and hemodynamically stable.   -Currently on IV Unasyn and vancomycin as per ID.  ID recommended that patient can be switched to linezolid through 06/15/2023 and amoxicillin through 06/10/2023 when ready for discharge. -underwent right BKA on 06/03/2023 by Dr. Lajoyce Corners.  Wound and wound VAC care and pain management as per orthopedics recommendations. -PT recommended CIR placement.  CIR following.  Currently medically stable for discharge to CIR  Hypersomnolence -Much improved after MS Contin dose was decreased from 30 mg twice a day to 15 mg twice a day.  Chronic pain with opiate dependence -MS Contin plan as above.  Continue as needed oxycodone.  Continue bowel regimen  GAD  -Continue as needed Xanax  Essential hypertension CAD in native artery Hyperlipidemia -Blood pressure currently controlled.  Continue amlodipine and rosuvastatin  Diabetes mellitus type 2 with peripheral neuropathy -Carb modified diet.  Continue CBGs with SSI.   Continue amitriptyline  Hyponatremia -No recent labs.  Obesity -Outpatient follow-up  DVT prophylaxis: Lovenox Code Status: Full Family Communication: None at bedside Disposition Plan: Status is: Inpatient Remains inpatient appropriate because: Of severity of illness.  Need for CIR placement.  Currently medically stable for discharge to CIR.  Consultants: Orthopedics/ID/podiatry  Procedures: As above  Antimicrobials:  Anti-infectives (From admission, onward)    Start     Dose/Rate Route Frequency Ordered Stop   06/03/23 1200  ceFAZolin (ANCEF) IVPB 2g/100 mL premix  Status:  Discontinued        2 g 200 mL/hr over 30 Minutes Intravenous Every 8 hours 06/03/23 1105 06/03/23 1211   06/03/23 0800  ceFAZolin (ANCEF) IVPB 2g/100 mL premix        2 g 200 mL/hr over 30 Minutes Intravenous On call to O.R. 06/03/23 5621 06/03/23 0925   06/03/23 0749  ceFAZolin (ANCEF) 2-4 GM/100ML-% IVPB       Note to Pharmacy: Aquilla Hacker M: cabinet override      06/03/23 0749 06/03/23 0931   06/03/23 0715  vancomycin (VANCOREADY) IVPB 1500 mg/300 mL  Status:  Discontinued        1,500 mg 150 mL/hr over 120 Minutes Intravenous On call to O.R. 06/03/23 3086 06/03/23 0638   06/02/23 1315  vancomycin (VANCOREADY) IVPB 1250 mg/250 mL        1,250 mg 166.7 mL/hr over 90 Minutes Intravenous 2 times daily 06/02/23 1222     05/31/23 1800  Ampicillin-Sulbactam (UNASYN) 3 g in sodium chloride 0.9 % 100 mL IVPB        3 g 200 mL/hr over 30 Minutes Intravenous Every 6 hours 05/31/23 1224  05/30/23 1000  vancomycin (VANCOREADY) IVPB 1250 mg/250 mL  Status:  Discontinued        1,250 mg 166.7 mL/hr over 90 Minutes Intravenous Every 12 hours 05/29/23 2047 05/31/23 1224   05/29/23 2100  vancomycin (VANCOCIN) 2,500 mg in sodium chloride 0.9 % 500 mL IVPB        2,500 mg 262.5 mL/hr over 120 Minutes Intravenous  Once 05/29/23 2047 05/29/23 2335   05/29/23 2000  cefTRIAXone (ROCEPHIN) 2 g in sodium chloride  0.9 % 100 mL IVPB  Status:  Discontinued        2 g 200 mL/hr over 30 Minutes Intravenous Every 24 hours 05/29/23 1953 05/31/23 1224   05/29/23 2000  metroNIDAZOLE (FLAGYL) tablet 500 mg  Status:  Discontinued        500 mg Oral Every 12 hours 05/29/23 1953 05/31/23 1224   05/29/23 1930  piperacillin-tazobactam (ZOSYN) IVPB 3.375 g  Status:  Discontinued        3.375 g 100 mL/hr over 30 Minutes Intravenous  Once 05/29/23 1929 05/29/23 1953        Subjective: Patient seen and examined at bedside.  No chest pain, fever, vomiting reported. Objective: Vitals:   06/07/23 1555 06/07/23 2031 06/08/23 0459 06/08/23 0815  BP: 123/67 121/60 118/64 114/68  Pulse: 80 80 62 73  Resp: 16 18 18 18   Temp: 98.6 F (37 C) 98.5 F (36.9 C) 97.8 F (36.6 C) 97.8 F (36.6 C)  TempSrc: Oral Oral Oral Oral  SpO2: 98% 97% 95% 98%  Weight:      Height:        Intake/Output Summary (Last 24 hours) at 06/08/2023 0826 Last data filed at 06/08/2023 0816 Gross per 24 hour  Intake 3474.16 ml  Output 1400 ml  Net 2074.16 ml   Filed Weights   05/29/23 1556 05/29/23 2357 06/03/23 0801  Weight: 108.9 kg 108.6 kg 108.6 kg    Examination:  General: Currently on room air.  No distress.  Chronically ill and deconditioned looking.  Flat affect.  Slow to respond. respiratory: Decreased breath sounds at bases bilaterally with some crackles CVS: Currently rate controlled; S1-S2 heard  abdominal: Soft, obese, nontender, slightly distended, no organomegaly; bowel sounds are heard  extremities: Left BKA present.  Right BKA with dressing and drain present    Data Reviewed: I have personally reviewed following labs and imaging studies  CBC: No results for input(s): "WBC", "NEUTROABS", "HGB", "HCT", "MCV", "PLT" in the last 168 hours.  Basic Metabolic Panel: Recent Labs  Lab 06/04/23 0757  NA 133*  K 4.1  CL 104  CO2 21*  GLUCOSE 157*  BUN 21*  CREATININE 0.82  CALCIUM 8.6*   GFR: Estimated  Creatinine Clearance: 128.9 mL/min (by C-G formula based on SCr of 0.82 mg/dL). Liver Function Tests: No results for input(s): "AST", "ALT", "ALKPHOS", "BILITOT", "PROT", "ALBUMIN" in the last 168 hours. No results for input(s): "LIPASE", "AMYLASE" in the last 168 hours. No results for input(s): "AMMONIA" in the last 168 hours. Coagulation Profile: No results for input(s): "INR", "PROTIME" in the last 168 hours. Cardiac Enzymes: No results for input(s): "CKTOTAL", "CKMB", "CKMBINDEX", "TROPONINI" in the last 168 hours. BNP (last 3 results) No results for input(s): "PROBNP" in the last 8760 hours. HbA1C: No results for input(s): "HGBA1C" in the last 72 hours. CBG: Recent Labs  Lab 06/07/23 1706 06/07/23 2033 06/08/23 0011 06/08/23 0408 06/08/23 0811  GLUCAP 260* 247* 206* 121* 209*   Lipid Profile: No results  for input(s): "CHOL", "HDL", "LDLCALC", "TRIG", "CHOLHDL", "LDLDIRECT" in the last 72 hours. Thyroid Function Tests: No results for input(s): "TSH", "T4TOTAL", "FREET4", "T3FREE", "THYROIDAB" in the last 72 hours. Anemia Panel: No results for input(s): "VITAMINB12", "FOLATE", "FERRITIN", "TIBC", "IRON", "RETICCTPCT" in the last 72 hours. Sepsis Labs: No results for input(s): "PROCALCITON", "LATICACIDVEN" in the last 168 hours.  Recent Results (from the past 240 hour(s))  Culture, blood (Routine X 2) w Reflex to ID Panel     Status: Abnormal   Collection Time: 05/29/23  7:48 PM   Specimen: BLOOD  Result Value Ref Range Status   Specimen Description BLOOD SITE NOT SPECIFIED  Final   Special Requests   Final    BOTTLES DRAWN AEROBIC AND ANAEROBIC Blood Culture results may not be optimal due to an excessive volume of blood received in culture bottles   Culture  Setup Time   Final    GRAM NEGATIVE RODS GRAM POSITIVE COCCI IN CLUSTERS IN BOTH AEROBIC AND ANAEROBIC BOTTLES CRITICAL RESULT CALLED TO, READ BACK BY AND VERIFIED WITH: PHARMD E.SINCLAIR AT 1206 ON 05/30/2023 BY  T.SAAD. Performed at [facility], [address], [place], [place] [address]    Culture PROTEUS MIRABILIS ENTEROCOCCUS FAECALIS  (A)  Final   Report Status 06/03/2023 FINAL  Final   Organism ID, Bacteria PROTEUS MIRABILIS  Final   Organism ID, Bacteria ENTEROCOCCUS FAECALIS  Final      Susceptibility   Enterococcus faecalis - MIC*    AMPICILLIN <=2 SENSITIVE Sensitive     VANCOMYCIN 1 SENSITIVE Sensitive     GENTAMICIN SYNERGY RESISTANT Resistant     * ENTEROCOCCUS FAECALIS   Proteus mirabilis - MIC*    AMPICILLIN <=2 SENSITIVE Sensitive     CEFEPIME <=0.12 SENSITIVE Sensitive     CEFTAZIDIME <=1 SENSITIVE Sensitive     CEFTRIAXONE <=0.25 SENSITIVE Sensitive     CIPROFLOXACIN <=0.25 SENSITIVE Sensitive     GENTAMICIN <=1 SENSITIVE Sensitive     IMIPENEM 2 SENSITIVE Sensitive     TRIMETH/SULFA <=20 SENSITIVE Sensitive     AMPICILLIN/SULBACTAM <=2 SENSITIVE Sensitive     PIP/TAZO <=4 SENSITIVE Sensitive     * PROTEUS MIRABILIS  Blood Culture ID Panel (Reflexed)     Status: Abnormal   Collection Time: 05/29/23  7:48 PM  Result Value Ref Range Status   Enterococcus faecalis DETECTED (A) NOT DETECTED Final    Comment: CRITICAL RESULT CALLED TO, READ BACK BY AND VERIFIED WITH: PHARMD E.SINCLAIR AT 1206 ON 05/30/2023 BY T.SAAD.    Enterococcus Faecium NOT DETECTED NOT DETECTED Final   Listeria monocytogenes NOT DETECTED NOT DETECTED Final   Staphylococcus species NOT DETECTED NOT DETECTED Final   Staphylococcus aureus (BCID) NOT DETECTED NOT DETECTED Final   Staphylococcus epidermidis NOT DETECTED NOT DETECTED Final   Staphylococcus lugdunensis NOT DETECTED NOT DETECTED Final   Streptococcus species NOT DETECTED NOT DETECTED Final   Streptococcus agalactiae NOT DETECTED NOT DETECTED Final   Streptococcus pneumoniae NOT DETECTED NOT DETECTED Final   Streptococcus pyogenes NOT DETECTED NOT DETECTED Final   A.calcoaceticus-baumannii NOT DETECTED NOT DETECTED Final    Bacteroides fragilis NOT DETECTED NOT DETECTED Final   Enterobacterales DETECTED (A) NOT DETECTED Final    Comment: Enterobacterales represent a large order of gram negative bacteria, not a single organism. CRITICAL RESULT CALLED TO, READ BACK BY AND VERIFIED WITH: PHARMD E.SINCLAIR AT 1206 ON 05/30/2023 BY T.SAAD.    Enterobacter cloacae complex NOT DETECTED NOT DETECTED Final   Escherichia  coli NOT DETECTED NOT DETECTED Final   Klebsiella aerogenes NOT DETECTED NOT DETECTED Final   Klebsiella oxytoca NOT DETECTED NOT DETECTED Final   Klebsiella pneumoniae NOT DETECTED NOT DETECTED Final   Proteus species DETECTED (A) NOT DETECTED Final    Comment: CRITICAL RESULT CALLED TO, READ BACK BY AND VERIFIED WITH: PHARMD E.SINCLAIR AT 1206 ON 05/30/2023 BY T.SAAD.    Salmonella species NOT DETECTED NOT DETECTED Final   Serratia marcescens NOT DETECTED NOT DETECTED Final   Haemophilus influenzae NOT DETECTED NOT DETECTED Final   Neisseria meningitidis NOT DETECTED NOT DETECTED Final   Pseudomonas aeruginosa NOT DETECTED NOT DETECTED Final   Stenotrophomonas maltophilia NOT DETECTED NOT DETECTED Final   Candida albicans NOT DETECTED NOT DETECTED Final   Candida auris NOT DETECTED NOT DETECTED Final   Candida glabrata NOT DETECTED NOT DETECTED Final   Candida krusei NOT DETECTED NOT DETECTED Final   Candida parapsilosis NOT DETECTED NOT DETECTED Final   Candida tropicalis NOT DETECTED NOT DETECTED Final   Cryptococcus neoformans/gattii NOT DETECTED NOT DETECTED Final   CTX-M ESBL NOT DETECTED NOT DETECTED Final   Carbapenem resistance IMP NOT DETECTED NOT DETECTED Final   Carbapenem resistance KPC NOT DETECTED NOT DETECTED Final   Carbapenem resistance NDM NOT DETECTED NOT DETECTED Final   Carbapenem resist OXA 48 LIKE NOT DETECTED NOT DETECTED Final   Vancomycin resistance NOT DETECTED NOT DETECTED Final   Carbapenem resistance VIM NOT DETECTED NOT DETECTED Final    Comment: Performed at  [facility], [address] [place], [place] [address]  Culture, blood (Routine X 2) w Reflex to ID Panel     Status: Abnormal   Collection Time: 05/29/23  8:04 PM   Specimen: BLOOD  Result Value Ref Range Status   Specimen Description BLOOD SITE NOT SPECIFIED  Final   Special Requests   Final    BOTTLES DRAWN AEROBIC AND ANAEROBIC Blood Culture adequate volume   Culture  Setup Time   Final    GRAM POSITIVE COCCI IN CHAINS ANAEROBIC BOTTLE ONLY CRITICAL VALUE NOTED.  VALUE IS CONSISTENT WITH PREVIOUSLY REPORTED AND CALLED VALUE. GRAM NEGATIVE RODS AEROBIC BOTTLE ONLY VALUE IS CONSISTENT WITH PREVIOUSLY REPORT AND CALLED VALUE    Culture (A)  Final    STREPTOCOCCUS MITIS/ORALIS PROTEUS MIRABILIS SUSCEPTIBILITIES PERFORMED ON PREVIOUS CULTURE WITHIN THE LAST 5 DAYS. Performed at [facility], [address], [place], [place] [address]    Report Status 06/02/2023 FINAL  Final   Organism ID, Bacteria STREPTOCOCCUS MITIS/ORALIS  Final      Susceptibility   Streptococcus mitis/oralis - MIC*    TETRACYCLINE >=16 RESISTANT Resistant     VANCOMYCIN 0.5 SENSITIVE Sensitive     CLINDAMYCIN >=1 RESISTANT Resistant     PENICILLIN Value in next row Resistant      RESISTANT4    CEFTRIAXONE Value in next row Resistant      RESISTANT4    * STREPTOCOCCUS MITIS/ORALIS  Surgical PCR screen     Status: Abnormal   Collection Time: 05/29/23 10:55 PM   Specimen: Nasal Mucosa; Nasal Swab  Result Value Ref Range Status   MRSA, PCR POSITIVE (A) NEGATIVE Final    Comment: RESULT CALLED TO, READ BACK BY AND VERIFIED WITH: S LYN RN 05/30/23 @ 0106 BY AB    Staphylococcus aureus POSITIVE (A) NEGATIVE Final    Comment: (NOTE) The Xpert SA Assay (FDA approved for NASAL specimens in patients 81 years of age and older), is  one component of a comprehensive surveillance program. It is not intended to diagnose infection nor to guide or monitor treatment. Performed at [facility],  [address] [place], [place] [address]   Culture, blood (Routine X 2) w Reflex to ID Panel     Status: None   Collection Time: 05/31/23  6:17 AM   Specimen: BLOOD  Result Value Ref Range Status   Specimen Description BLOOD LEFT ANTECUBITAL  Final   Special Requests   Final    BOTTLES DRAWN AEROBIC AND ANAEROBIC Blood Culture adequate volume   Culture   Final    NO GROWTH 5 DAYS Performed at [facility], [address], [place], [place] [address]    Report Status 06/05/2023 FINAL  Final  Culture, blood (Routine X 2) w Reflex to ID Panel     Status: None   Collection Time: 05/31/23  6:17 AM   Specimen: BLOOD LEFT HAND  Result Value Ref Range Status   Specimen Description BLOOD LEFT HAND  Final   Special Requests   Final    BOTTLES DRAWN AEROBIC AND ANAEROBIC Blood Culture adequate volume   Culture   Final    NO GROWTH 5 DAYS Performed at [facility], [address] [place], [place] [address]    Report Status 06/05/2023 FINAL  Final         Radiology Studies: No results found.      Scheduled Meds:  amitriptyline  100 mg Oral QHS   amLODipine  10 mg Oral Daily   vitamin C  1,000 mg Oral Daily   docusate sodium  100 mg Oral Daily   enoxaparin (LOVENOX) injection  40 mg Subcutaneous Q24H   feeding supplement  237 mL Oral BID BM   insulin aspart  0-6 Units Subcutaneous Q4H   morphine  15 mg Oral Q12H   multivitamin with minerals  1 tablet Oral Daily   nutrition supplement (JUVEN)  1 packet Oral BID BM   pantoprazole  40 mg Oral Daily   polyethylene glycol  17 g Oral Daily   rosuvastatin  20 mg Oral Daily   senna-docusate  1 tablet Oral QHS   zinc sulfate  220 mg Oral Daily   Continuous Infusions:  ampicillin-sulbactam (UNASYN) IV 3 g (06/08/23 8657)   magnesium sulfate bolus IVPB     vancomycin 1,250 mg (06/07/23 2358)          Glade Lloyd, MD [facility] 06/08/2023, 8:26 AM

## 2023-06-08 NOTE — Progress Notes (Signed)
Inpatient Rehab Admissions Coordinator:   Continue to await determination from [facility].  Will follow.   Estill Dooms, PT, DPT Admissions Coordinator [phone] 06/08/23  10:45 AM

## 2023-06-08 NOTE — Plan of Care (Signed)
  Problem: Coping: Goal: Ability to adjust to condition or change in health will improve Outcome: Progressing   Problem: Fluid Volume: Goal: Ability to maintain a balanced intake and output will improve Outcome: Progressing   Problem: Nutritional: Goal: Maintenance of adequate nutrition will improve Outcome: Progressing   Problem: Health Behavior/Discharge Planning: Goal: Ability to manage health-related needs will improve Outcome: Progressing

## 2023-06-08 NOTE — Progress Notes (Signed)
Occupational Therapy Treatment Patient Details Name: Eugene Diaz MRN: [id number] DOB: 31-Jul-1966 Today's Date: 06/08/2023   History of present illness Pt is a 57 y.o. M who presents 7/29 with R foot pain and wound, no s/p R BKA on 8/3. PMH includes: L BKA, DM II, CAD, depression, anxiety, alcohol abuse, GERD, and PVD.   OT comments  Pt in good spirits, motivated to participate. Prior to session Pt able to complete bathing at bed level with set up. Pt able to complete sit to stand with min A from elevated surface using RW, poor overall balance, reliant on RW. Pt was able to transfer to recliner with increased time, mod A for support for safety due to new R BKA. Pt would benefit greatly from continued skilled therapy to improve ability to transfer and further instruct on compensatory strategies for ADLs, DC plan still appropriate.      If plan is discharge home, recommend the following:  Two people to help with walking and/or transfers;A lot of help with bathing/dressing/bathroom;Assist for transportation;Help with stairs or ramp for entrance   Equipment Recommendations  Other (comment)    Recommendations for Other Services      Precautions / Restrictions Precautions Precautions: Fall Precaution Comments: chronic L BKA, has prosthetic, new R BKA Restrictions Weight Bearing Restrictions: Yes RLE Weight Bearing: Non weight bearing Other Position/Activity Restrictions: RLE limb protector       Mobility Bed Mobility Overal bed mobility: Modified Independent             General bed mobility comments: mod I    Transfers Overall transfer level: Needs assistance Equipment used: Rolling walker (2 wheels) Transfers: Sit to/from Stand, Bed to chair/wheelchair/BSC Sit to Stand: Min assist, +2 safety/equipment, From elevated surface Stand pivot transfers: Mod assist         General transfer comment: Pt able to perform STS with min A from elevated surface, transferred to  recliner with use of RW, mod A x2 due to unsteadiness and safety, needs further training on t/f to recliner     Balance Overall balance assessment: Needs assistance Sitting-balance support: Feet supported Sitting balance-Leahy Scale: Good Sitting balance - Comments: Pt able to lean L/R as needed, able to don/doff RLE limb protector, and L prosthetic with set up   Standing balance support: During functional activity, Reliant on assistive device for balance Standing balance-Leahy Scale: Poor Standing balance comment: reliant on crutches or RW for support                           ADL either performed or assessed with clinical judgement   ADL Overall ADL's : Needs assistance/impaired Eating/Feeding: Set up;Sitting   Grooming: Set up;Sitting   Upper Body Bathing: Set up;Bed level   Lower Body Bathing: Set up;Sitting/lateral leans;Bed level   Upper Body Dressing : Set up;Sitting       Toilet Transfer: Moderate assistance;+2 for safety/equipment;BSC/3in1;Rolling walker (2 wheels)             General ADL Comments: Pt able to perform bathing prior to session in bed with set up. Pt completed UB/LB dressing with set up. Pt able to perform transfer to recliner with mod A, increased time using RW.    Extremity/Trunk Assessment Upper Extremity Assessment Upper Extremity Assessment: Overall WFL for tasks assessed            Vision       Perception     Praxis  Cognition Arousal: Alert Behavior During Therapy: WFL for tasks assessed/performed Overall Cognitive Status: Within Functional Limits for tasks assessed                                          Exercises      Shoulder Instructions       General Comments      Pertinent Vitals/ Pain       Pain Assessment Pain Assessment: No/denies pain  Home Living                                          Prior Functioning/Environment              Frequency  Min  2X/week        Progress Toward Goals  OT Goals(current goals can now be found in the care plan section)  Progress towards OT goals: Progressing toward goals  Acute Rehab OT Goals Patient Stated Goal: to improve mobility OT Goal Formulation: With patient Time For Goal Achievement: 06/19/23 Potential to Achieve Goals: Good ADL Goals Pt Will Perform Lower Body Bathing: with min assist;sit to/from stand;sitting/lateral leans Pt Will Perform Lower Body Dressing: with min assist;sit to/from stand;sitting/lateral leans Pt Will Transfer to Toilet: with mod assist;stand pivot transfer;bedside commode Pt Will Perform Toileting - Clothing Manipulation and hygiene: with mod assist;sitting/lateral leans;sit to/from stand Additional ADL Goal #1: Patient will be able to complete functional acitivty in standing for 1-2 minutes to increase overall activity tolerance in preparation for ADL and IADL tasks.  Plan      Co-evaluation                 AM-PAC OT "6 Clicks" Daily Activity     Outcome Measure   Help from another person eating meals?: None Help from another person taking care of personal grooming?: None Help from another person toileting, which includes using toliet, bedpan, or urinal?: A Lot Help from another person bathing (including washing, rinsing, drying)?: A Lot Help from another person to put on and taking off regular upper body clothing?: A Little Help from another person to put on and taking off regular lower body clothing?: A Lot 6 Click Score: 17    End of Session Equipment Utilized During Treatment: Gait belt;Rolling walker (2 wheels)  OT Visit Diagnosis: Unsteadiness on feet (R26.81);Other abnormalities of gait and mobility (R26.89);Muscle weakness (generalized) (M62.81);Pain Pain - Right/Left: Right Pain - part of body: Leg   Activity Tolerance Patient tolerated treatment well   Patient Left in chair;with call bell/phone within reach;with chair alarm set    Nurse Communication Mobility status        Time: 1650-1710 OT Time Calculation (min): 20 min  Charges: OT General Charges $OT Visit: 1 Visit OT Treatments $Therapeutic Activity: 8-22 mins  [address] OTR/L   Alexis Goodell 06/08/2023, 5:29 PM

## 2023-06-09 ENCOUNTER — Inpatient Hospital Stay (HOSPITAL_COMMUNITY)
Admission: RE | Admit: 2023-06-09 | Discharge: 2023-06-16 | DRG: 560 | Disposition: A | Discharge status: Home / Self Care | Payer: Medicare HMO | Source: Intra-hospital | Attending: Physical Medicine and Rehabilitation | Admitting: Physical Medicine and Rehabilitation

## 2023-06-09 ENCOUNTER — Encounter (HOSPITAL_COMMUNITY): Payer: Self-pay | Admitting: Physical Medicine and Rehabilitation

## 2023-06-09 DIAGNOSIS — Z89511 Acquired absence of right leg below knee: Secondary | ICD-10-CM | POA: Diagnosis not present

## 2023-06-09 DIAGNOSIS — R739 Hyperglycemia, unspecified: Secondary | ICD-10-CM | POA: Diagnosis not present

## 2023-06-09 DIAGNOSIS — G4700 Insomnia, unspecified: Secondary | ICD-10-CM | POA: Diagnosis present

## 2023-06-09 DIAGNOSIS — K219 Gastro-esophageal reflux disease without esophagitis: Secondary | ICD-10-CM | POA: Diagnosis present

## 2023-06-09 DIAGNOSIS — I959 Hypotension, unspecified: Secondary | ICD-10-CM | POA: Diagnosis not present

## 2023-06-09 DIAGNOSIS — G250 Essential tremor: Secondary | ICD-10-CM | POA: Diagnosis present

## 2023-06-09 DIAGNOSIS — Z4781 Encounter for orthopedic aftercare following surgical amputation: Secondary | ICD-10-CM | POA: Diagnosis not present

## 2023-06-09 DIAGNOSIS — E1151 Type 2 diabetes mellitus with diabetic peripheral angiopathy without gangrene: Secondary | ICD-10-CM | POA: Diagnosis present

## 2023-06-09 DIAGNOSIS — Z79891 Long term (current) use of opiate analgesic: Secondary | ICD-10-CM | POA: Diagnosis not present

## 2023-06-09 DIAGNOSIS — Z7984 Long term (current) use of oral hypoglycemic drugs: Secondary | ICD-10-CM | POA: Diagnosis not present

## 2023-06-09 DIAGNOSIS — F419 Anxiety disorder, unspecified: Secondary | ICD-10-CM | POA: Diagnosis present

## 2023-06-09 DIAGNOSIS — F17290 Nicotine dependence, other tobacco product, uncomplicated: Secondary | ICD-10-CM | POA: Diagnosis present

## 2023-06-09 DIAGNOSIS — B954 Other streptococcus as the cause of diseases classified elsewhere: Secondary | ICD-10-CM | POA: Diagnosis present

## 2023-06-09 DIAGNOSIS — F32A Depression, unspecified: Secondary | ICD-10-CM | POA: Diagnosis present

## 2023-06-09 DIAGNOSIS — E871 Hypo-osmolality and hyponatremia: Secondary | ICD-10-CM | POA: Diagnosis present

## 2023-06-09 DIAGNOSIS — Z79899 Other long term (current) drug therapy: Secondary | ICD-10-CM

## 2023-06-09 DIAGNOSIS — E1169 Type 2 diabetes mellitus with other specified complication: Secondary | ICD-10-CM | POA: Diagnosis present

## 2023-06-09 DIAGNOSIS — E1142 Type 2 diabetes mellitus with diabetic polyneuropathy: Secondary | ICD-10-CM | POA: Diagnosis not present

## 2023-06-09 DIAGNOSIS — I2510 Atherosclerotic heart disease of native coronary artery without angina pectoris: Secondary | ICD-10-CM | POA: Diagnosis present

## 2023-06-09 DIAGNOSIS — G546 Phantom limb syndrome with pain: Secondary | ICD-10-CM | POA: Diagnosis present

## 2023-06-09 DIAGNOSIS — J45909 Unspecified asthma, uncomplicated: Secondary | ICD-10-CM | POA: Diagnosis present

## 2023-06-09 DIAGNOSIS — G8929 Other chronic pain: Secondary | ICD-10-CM | POA: Diagnosis present

## 2023-06-09 DIAGNOSIS — I10 Essential (primary) hypertension: Secondary | ICD-10-CM | POA: Diagnosis present

## 2023-06-09 DIAGNOSIS — K5901 Slow transit constipation: Secondary | ICD-10-CM | POA: Diagnosis not present

## 2023-06-09 DIAGNOSIS — G548 Other nerve root and plexus disorders: Secondary | ICD-10-CM

## 2023-06-09 DIAGNOSIS — I25810 Atherosclerosis of coronary artery bypass graft(s) without angina pectoris: Secondary | ICD-10-CM

## 2023-06-09 DIAGNOSIS — E7800 Pure hypercholesterolemia, unspecified: Secondary | ICD-10-CM | POA: Diagnosis present

## 2023-06-09 DIAGNOSIS — M869 Osteomyelitis, unspecified: Secondary | ICD-10-CM | POA: Diagnosis present

## 2023-06-09 DIAGNOSIS — Z8249 Family history of ischemic heart disease and other diseases of the circulatory system: Secondary | ICD-10-CM

## 2023-06-09 DIAGNOSIS — Z89512 Acquired absence of left leg below knee: Secondary | ICD-10-CM | POA: Diagnosis not present

## 2023-06-09 DIAGNOSIS — B964 Proteus (mirabilis) (morganii) as the cause of diseases classified elsewhere: Secondary | ICD-10-CM | POA: Diagnosis present

## 2023-06-09 DIAGNOSIS — B952 Enterococcus as the cause of diseases classified elsewhere: Secondary | ICD-10-CM | POA: Diagnosis present

## 2023-06-09 LAB — GLUCOSE, CAPILLARY
Glucose-Capillary: 101 mg/dL — ABNORMAL HIGH (ref 70–99)
Glucose-Capillary: 112 mg/dL — ABNORMAL HIGH (ref 70–99)
Glucose-Capillary: 121 mg/dL — ABNORMAL HIGH (ref 70–99)
Glucose-Capillary: 168 mg/dL — ABNORMAL HIGH (ref 70–99)
Glucose-Capillary: 174 mg/dL — ABNORMAL HIGH (ref 70–99)
Glucose-Capillary: 221 mg/dL — ABNORMAL HIGH (ref 70–99)

## 2023-06-09 MED ORDER — AMOXICILLIN 250 MG PO CAPS
1000.0000 mg | ORAL_CAPSULE | Freq: Three times a day (TID) | ORAL | Status: AC
  Administered 2023-06-09 – 2023-06-11 (×6): 1000 mg via ORAL
  Filled 2023-06-09 (×6): qty 4

## 2023-06-09 MED ORDER — POLYETHYLENE GLYCOL 3350 17 G PO PACK: 17.0000 g | PACK | Freq: Every day | ORAL | Status: DC | PRN

## 2023-06-09 MED ORDER — ALUM & MAG HYDROXIDE-SIMETH 200-200-20 MG/5ML PO SUSP: 30.0000 mL | ORAL | Status: DC | PRN

## 2023-06-09 MED ORDER — ENOXAPARIN SODIUM 40 MG/0.4ML IJ SOSY: 40.0000 mg | PREFILLED_SYRINGE | INTRAMUSCULAR | Status: DC

## 2023-06-09 MED ORDER — ALPRAZOLAM 1 MG PO TABS: 1.0000 mg | ORAL_TABLET | Freq: Four times a day (QID) | ORAL | Status: AC | PRN

## 2023-06-09 MED ORDER — LINEZOLID 600 MG PO TABS
600.0000 mg | ORAL_TABLET | Freq: Two times a day (BID) | ORAL | Status: AC
  Administered 2023-06-09 – 2023-06-16 (×14): 600 mg via ORAL
  Filled 2023-06-09 (×14): qty 1

## 2023-06-09 MED ORDER — ONDANSETRON HCL 4 MG PO TABS: 4.0000 mg | ORAL_TABLET | Freq: Four times a day (QID) | ORAL | Status: DC | PRN

## 2023-06-09 MED ORDER — DIPHENHYDRAMINE HCL 25 MG PO CAPS: 25.0000 mg | ORAL_CAPSULE | Freq: Four times a day (QID) | ORAL | Status: DC | PRN

## 2023-06-09 MED ORDER — VITAMIN C 500 MG PO TABS
1000.0000 mg | ORAL_TABLET | Freq: Every day | ORAL | Status: DC
  Administered 2023-06-10 – 2023-06-16 (×7): 1000 mg via ORAL
  Filled 2023-06-09 (×7): qty 2

## 2023-06-09 MED ORDER — SENNOSIDES-DOCUSATE SODIUM 8.6-50 MG PO TABS: 1.0000 | ORAL_TABLET | Freq: Two times a day (BID) | ORAL | Status: DC

## 2023-06-09 MED ORDER — JUVEN PO PACK
1.0000 | PACK | Freq: Two times a day (BID) | ORAL | Status: DC
  Administered 2023-06-10 – 2023-06-16 (×9): 1 via ORAL
  Filled 2023-06-09 (×9): qty 1

## 2023-06-09 MED ORDER — PANTOPRAZOLE SODIUM 40 MG PO TBEC
40.0000 mg | DELAYED_RELEASE_TABLET | Freq: Every day | ORAL | Status: DC
  Administered 2023-06-10 – 2023-06-16 (×7): 40 mg via ORAL
  Filled 2023-06-09 (×7): qty 1

## 2023-06-09 MED ORDER — METHOCARBAMOL 500 MG PO TABS
500.0000 mg | ORAL_TABLET | Freq: Four times a day (QID) | ORAL | Status: DC | PRN
  Administered 2023-06-14 – 2023-06-16 (×4): 500 mg via ORAL
  Filled 2023-06-09 (×4): qty 1

## 2023-06-09 MED ORDER — GABAPENTIN 100 MG PO CAPS
100.0000 mg | ORAL_CAPSULE | Freq: Three times a day (TID) | ORAL | Status: DC
  Administered 2023-06-09 – 2023-06-16 (×20): 100 mg via ORAL
  Filled 2023-06-09 (×20): qty 1

## 2023-06-09 MED ORDER — ZINC SULFATE 220 (50 ZN) MG PO CAPS: 220.0000 mg | ORAL_CAPSULE | Freq: Every day | ORAL | Status: DC

## 2023-06-09 MED ORDER — AMOXICILLIN 500 MG PO TABS: 1000.0000 mg | ORAL_TABLET | Freq: Three times a day (TID) | ORAL | Status: DC

## 2023-06-09 MED ORDER — INSULIN ASPART 100 UNIT/ML IJ SOLN: 0.0000 [IU] | INTRAMUSCULAR | Status: DC

## 2023-06-09 MED ORDER — MORPHINE SULFATE ER 15 MG PO TBCR: 15.0000 mg | EXTENDED_RELEASE_TABLET | Freq: Two times a day (BID) | ORAL | Status: AC

## 2023-06-09 MED ORDER — DOCUSATE SODIUM 100 MG PO CAPS
100.0000 mg | ORAL_CAPSULE | Freq: Every day | ORAL | Status: DC
  Administered 2023-06-10 – 2023-06-16 (×7): 100 mg via ORAL
  Filled 2023-06-09 (×6): qty 1

## 2023-06-09 MED ORDER — OXYCODONE HCL 30 MG PO TABS: 30.0000 mg | ORAL_TABLET | Freq: Three times a day (TID) | ORAL | Status: AC | PRN

## 2023-06-09 MED ORDER — ACETAMINOPHEN 325 MG PO TABS
325.0000 mg | ORAL_TABLET | ORAL | Status: DC | PRN
  Administered 2023-06-10 – 2023-06-16 (×5): 650 mg via ORAL
  Filled 2023-06-09 (×5): qty 2

## 2023-06-09 MED ORDER — FLEET ENEMA 7-19 GM/118ML RE ENEM: 1.0000 | ENEMA | Freq: Once | RECTAL | Status: DC | PRN

## 2023-06-09 MED ORDER — ALPRAZOLAM 0.5 MG PO TABS
1.0000 mg | ORAL_TABLET | Freq: Four times a day (QID) | ORAL | Status: DC | PRN
  Administered 2023-06-09 – 2023-06-16 (×6): 1 mg via ORAL
  Filled 2023-06-09 (×6): qty 2

## 2023-06-09 MED ORDER — INSULIN ASPART 100 UNIT/ML IJ SOLN
0.0000 [IU] | Freq: Three times a day (TID) | INTRAMUSCULAR | Status: DC
  Administered 2023-06-09: 1 [IU] via SUBCUTANEOUS
  Administered 2023-06-10: 2 [IU] via SUBCUTANEOUS
  Administered 2023-06-10 – 2023-06-11 (×4): 1 [IU] via SUBCUTANEOUS

## 2023-06-09 MED ORDER — MORPHINE SULFATE ER 15 MG PO TBCR
15.0000 mg | EXTENDED_RELEASE_TABLET | Freq: Two times a day (BID) | ORAL | Status: DC
  Administered 2023-06-09 – 2023-06-16 (×14): 15 mg via ORAL
  Filled 2023-06-09 (×14): qty 1

## 2023-06-09 MED ORDER — LINEZOLID 600 MG PO TABS: 600.0000 mg | ORAL_TABLET | Freq: Two times a day (BID) | ORAL | Status: DC

## 2023-06-09 MED ORDER — AMLODIPINE BESYLATE 10 MG PO TABS
10.0000 mg | ORAL_TABLET | Freq: Every day | ORAL | Status: DC
  Administered 2023-06-10 – 2023-06-15 (×6): 10 mg via ORAL
  Filled 2023-06-09 (×6): qty 1

## 2023-06-09 MED ORDER — ROSUVASTATIN CALCIUM 20 MG PO TABS
20.0000 mg | ORAL_TABLET | Freq: Every day | ORAL | Status: DC
  Administered 2023-06-10 – 2023-06-16 (×7): 20 mg via ORAL
  Filled 2023-06-09 (×7): qty 1

## 2023-06-09 MED ORDER — GUAIFENESIN-DM 100-10 MG/5ML PO SYRP: 10.0000 mL | ORAL_SOLUTION | Freq: Four times a day (QID) | ORAL | Status: DC | PRN

## 2023-06-09 MED ORDER — ZINC SULFATE 220 (50 ZN) MG PO CAPS
220.0000 mg | ORAL_CAPSULE | Freq: Every day | ORAL | Status: AC
  Administered 2023-06-10 – 2023-06-16 (×7): 220 mg via ORAL
  Filled 2023-06-09 (×7): qty 1

## 2023-06-09 MED ORDER — SORBITOL 70 % SOLN: 30.0000 mL | Freq: Every day | Status: DC | PRN

## 2023-06-09 MED ORDER — POLYETHYLENE GLYCOL 3350 17 G PO PACK: 17.0000 g | PACK | Freq: Every day | ORAL | Status: DC

## 2023-06-09 MED ORDER — ENOXAPARIN SODIUM 40 MG/0.4ML IJ SOSY
40.0000 mg | PREFILLED_SYRINGE | INTRAMUSCULAR | Status: DC
  Administered 2023-06-10 – 2023-06-15 (×6): 40 mg via SUBCUTANEOUS
  Filled 2023-06-09 (×6): qty 0.4

## 2023-06-09 MED ORDER — OXYCODONE HCL 5 MG PO TABS
5.0000 mg | ORAL_TABLET | ORAL | Status: DC | PRN
  Administered 2023-06-09 – 2023-06-15 (×17): 10 mg via ORAL
  Filled 2023-06-09 (×18): qty 2

## 2023-06-09 MED ORDER — INSULIN ASPART 100 UNIT/ML IJ SOLN: 0.0000 [IU] | Freq: Three times a day (TID) | INTRAMUSCULAR | Status: DC

## 2023-06-09 MED ORDER — ONDANSETRON HCL 4 MG/2ML IJ SOLN: 4.0000 mg | Freq: Four times a day (QID) | INTRAMUSCULAR | Status: DC | PRN

## 2023-06-09 MED ORDER — ADULT MULTIVITAMIN W/MINERALS CH
1.0000 | ORAL_TABLET | Freq: Every day | ORAL | Status: DC
  Administered 2023-06-10 – 2023-06-16 (×7): 1 via ORAL
  Filled 2023-06-09 (×7): qty 1

## 2023-06-09 MED ORDER — SENNOSIDES-DOCUSATE SODIUM 8.6-50 MG PO TABS
1.0000 | ORAL_TABLET | Freq: Every day | ORAL | Status: DC
  Administered 2023-06-14 – 2023-06-15 (×2): 1 via ORAL
  Filled 2023-06-09 (×6): qty 1

## 2023-06-09 MED ORDER — ASCORBIC ACID 1000 MG PO TABS: 1000.0000 mg | ORAL_TABLET | Freq: Every day | ORAL | Status: DC

## 2023-06-09 NOTE — Consult Note (Signed)
   [facility] Inpatient Consult   06/09/2023  Eugene Diaz 1966/05/06 [id number]  [facility] [[facility]]  Accountable Care Organization [ACO] Patient: [facility] insurance  Primary Care Provider:  Elfredia Nevins, MD  with [facility] is listed  Patient screened for less than 30 days readmission hospitalizations with noted high risk score for unplanned readmission risk with a 10 day length of stay and  to assess for potential [facility]  [[facility]] Care Management service needs for post hospital transition for care coordination.  Review of patient's electronic medical record reveals patient is transitioning today.  Patient is currently for an inpatient rehabilitation transition and getting ready on rounds   Plan:  Continue to follow progress periodically and disposition to assess for post hospital community care coordination/management needs from CIR.    Referral request for community care coordination: pending disposition needs from CIR  Of note, [facility] Care Management/Population Health does not replace or interfere with any arrangements made by the Inpatient Transition of Care team.  For questions contact:   Charlesetta Shanks, RN BSN CCM [facility]  [phone] business mobile phone Toll free office [phone]  *Concierge Line  [phone] Fax number: [phone] [email or web address]

## 2023-06-09 NOTE — Progress Notes (Signed)
Fanny Dance, MD  Physician Physical Medicine and Rehabilitation   PMR Pre-admission     Signed   Date of Service: 06/07/2023 11:03 AM  Related encounter: ED to Hosp-Admission (Current) from 05/29/2023 in [facility]  SURGICAL   Signed     Expand All Collapse All  PMR Admission Coordinator Pre-Admission Assessment   Patient: Eugene Diaz is an 57 y.o., male MRN: [id number] DOB: 03-24-1966 Height: 6' 2.02" (188 cm) Weight: 108.6 kg                                                                                                                                                  Insurance Information HMO: yes    PPO:      PCP:      IPA:      80/20:      OTHER:  PRIMARY: [employer]      Policy#: [id number]       Subscriber: pt CM Name: Livia Snellen      Phone#: [phone] ext [phone]     Fax#: [phone] Pre-Cert#: [id number] auth for CIR via fax from [place] with updates due to her at fax listed above on 8/15      Employer:  Benefits:  Phone #: [phone]     Name:  Eff. Date: 04/01/23     Deduct: $240 (met)      Out of Pocket Max: [phone] (met [phone])      Life Max: n/a  CIR: $2080/admit      SNF: 20 full days Outpatient: 80%     Co-Pay: 20% Home Health: 100%      Co-Pay:  DME: 80%     Co-Pay: 20% Providers:  SECONDARY: Medicaid [place]      Policy#: [id number]      Phone#:    Financial Counselor:       Phone#:    The "Data Collection Information Summary" for patients in Inpatient Rehabilitation Facilities with attached "Privacy Act Statement-Health Care Records" was provided and verbally reviewed with: Patient and Family   Emergency Contact Information Contact Information       Name Relation Home Work [place] Relative     [phone]         Other Contacts       Name Relation Home Work Mobile    [place] Mother [phone]        CARLOSMANUEL, PRIDDY     [phone]    [place] Oats Sister [phone]   [phone]          Current Medical History  Patient Admitting Diagnosis: R BKA   History of Present Illness: Pt is a 57 y/o male with PMH of L BKA, DM, CAD, depression, anxiety, ETOH use, and PVD admitted to [facility] on 7/29 with R foot pain and draining  wound.  He was sent to the ER from his podiatrist office.  Labs notable for some mild hyponatremia, but otherwise normal.  Plain radiographs of RLE concerning for osteomyelitis.  ID consulted and recommended vancomycin, rocephin, and flagyl as well as TTE.  TTE showed thickening of aortic valve.  Repeat blood cultures negative.  Podiatry and orthopedics consulted.  Podiatry with nothing to add.  Dr. Lajoyce Corners recommend BKA which pt underwent on 8/3.  Hospital course sepsis due to enterococcus and proteus bacteremia, abx narrowed to unasyn and vancomycin.  ID recommended that pt can be switched to liezolid through 8/15 and amoxicillin through 8/10 when ready for d/c.  Pt with wound vac per ortho.  Pain management per hospitalist, decreased ms contin from 30 mg BID to 15 mg BID.  Therapy evaluations completed and pt was recommended for CIR.    Patient's medical record from [facility] has been reviewed by the rehabilitation admission coordinator and physician.   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) 05/23/2022   Depression     Diabetic neuropathy (HCC)     Edema of both lower extremities     Essential tremor      neurologist-- dr patel--- due to alcohol abuse   GERD (gastroesophageal reflux disease)     History of cellulitis 2020    left lower leg;   recurrent 03-25-2020   History of esophageal stricture      s/p  dilatation 02-13-2018   History of osteomyelitis 2020    left great toe      Hypercholesterolemia     Hypertension      followed by pcp  (nuclear stress test 03-11-2014 low risk w/ no ischemia, ef 65%   Normocytic anemia      followed by pcp   (03-18-2020 had transfusion's 02/ 2021)   Osteomyelitis of great toe of right foot (HCC) 08/18/2021   Peripheral vascular disease (HCC)     Severe depression (HCC) 06/09/2021   Status post incision and drainage followed by Dr Samuella Cota and pcp    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 Yes   Family History  family history includes Cancer in his father; Healthy in his brother and sister; Heart attack in his mother.     Current Medications   Current Medications    Current Facility-Administered Medications:    acetaminophen (TYLENOL) tablet 650 mg, 650 mg, Oral, Q6H PRN, 650 mg at 06/08/23 2052 **OR**  acetaminophen (TYLENOL) suppository 650 mg, 650 mg, Rectal, Q6H PRN, Nadara Mustard, MD   acetaminophen (TYLENOL) tablet 325-650 mg, 325-650 mg, Oral, Q6H PRN, Nadara Mustard, MD   ALPRAZolam Prudy Feeler) tablet 1 mg, 1 mg, Oral, QID PRN, Nadara Mustard, MD, 1 mg at 06/08/23 2053   alum & mag hydroxide-simeth (MAALOX/MYLANTA) 200-200-20 MG/5ML suspension 15-30 mL, 15-30 mL, Oral, Q2H PRN, Nadara Mustard, MD   amitriptyline (ELAVIL) tablet 100 mg, 100 mg, Oral, QHS, Sharl Ma, Gagan S, MD, 100 mg at 06/08/23 2052   amLODipine (NORVASC) tablet 10 mg, 10 mg, Oral, Daily, Nadara Mustard, MD, 10 mg at  06/09/23 0916   Ampicillin-Sulbactam (UNASYN) 3 g in sodium chloride 0.9 % 100 mL IVPB, 3 g, Intravenous, Q6H, Nadara Mustard, MD, Last Rate: 200 mL/hr at 06/09/23 0532, 3 g at 06/09/23 0532   ascorbic acid (VITAMIN C) tablet 1,000 mg, 1,000 mg, Oral, Daily, Nadara Mustard, MD, 1,000 mg at 06/09/23 0916   bisacodyl (DULCOLAX) EC tablet 5 mg, 5 mg, Oral, Daily PRN, Nadara Mustard, MD   docusate sodium (COLACE) capsule 100 mg, 100 mg, Oral, Daily, Nadara Mustard, MD, 100 mg at 06/09/23 0916   enoxaparin (LOVENOX) injection 40 mg, 40 mg, Subcutaneous, Q24H, Lama, Gagan S, MD, 40 mg at 06/08/23 1302   feeding supplement (ENSURE ENLIVE / ENSURE PLUS) liquid 237 mL, 237 mL, Oral, BID BM, Nadara Mustard, MD, 237 mL at 06/09/23 0918   guaiFENesin-dextromethorphan (ROBITUSSIN DM) 100-10 MG/5ML syrup 15 mL, 15 mL, Oral, Q4H PRN, Nadara Mustard, MD   hydrALAZINE (APRESOLINE) injection 5 mg, 5 mg, Intravenous, Q20 Min PRN, Nadara Mustard, MD   HYDROmorphone (DILAUDID) injection 0.5-1 mg, 0.5-1 mg, Intravenous, Q4H PRN, Nadara Mustard, MD, 1 mg at 06/06/23 2322   insulin aspart (novoLOG) injection 0-6 Units, 0-6 Units, Subcutaneous, Q4H, Nadara Mustard, MD, 1 Units at 06/09/23 0900   labetalol (NORMODYNE) injection 10 mg, 10 mg, Intravenous, Q10 min PRN, Nadara Mustard, MD   magnesium citrate solution 1 Bottle, 1 Bottle, Oral, Once PRN, Nadara Mustard, MD   magnesium sulfate IVPB 2 g 50 mL, 2 g, Intravenous, Daily PRN, Nadara Mustard, MD   metoprolol tartrate (LOPRESSOR) injection 2-5 mg, 2-5 mg, Intravenous, Q2H PRN, Nadara Mustard, MD   morphine (MS CONTIN) 12 hr tablet 15 mg, 15 mg, Oral, Q12H, Sharl Ma, Sarina Ill, MD, 15 mg at 06/09/23 1610   multivitamin with minerals tablet 1 tablet, 1 tablet, Oral, Daily, Nadara Mustard, MD, 1 tablet at 06/09/23 9604   nutrition supplement (JUVEN) (JUVEN) powder packet 1 packet, 1 packet, Oral, BID BM, Nadara Mustard, MD, 1 packet at 06/09/23 0918   ondansetron (ZOFRAN) tablet  4 mg, 4 mg, Oral, Q6H PRN **OR** ondansetron (ZOFRAN) injection 4 mg, 4 mg, Intravenous, Q6H PRN, Nadara Mustard, MD   ondansetron [facility]) injection 4 mg, 4 mg, Intravenous, Q6H PRN, Nadara Mustard, MD   oxyCODONE (Oxy IR/ROXICODONE) immediate release tablet 10-15 mg, 10-15 mg, Oral, Q4H PRN, Nadara Mustard, MD, 15 mg at 06/07/23 2311   oxyCODONE (Oxy IR/ROXICODONE) immediate release tablet 30 mg, 30 mg, Oral, Q8H PRN, Nadara Mustard, MD, 30 mg at 06/08/23 2051   oxyCODONE (Oxy IR/ROXICODONE) immediate release tablet 5-10 mg, 5-10 mg, Oral, Q4H PRN, Nadara Mustard, MD, 10 mg at 06/07/23 1718   pantoprazole (PROTONIX) EC tablet 40 mg, 40 mg, Oral, Daily, Nadara Mustard, MD, 40  mg at 06/09/23 0916   phenol (CHLORASEPTIC) mouth spray 1 spray, 1 spray, Mouth/Throat, PRN, Nadara Mustard, MD   polyethylene glycol (MIRALAX / GLYCOLAX) packet 17 g, 17 g, Oral, Daily, Nadara Mustard, MD, 17 g at 05/31/23 1130   polyethylene glycol (MIRALAX / GLYCOLAX) packet 17 g, 17 g, Oral, Daily PRN, Nadara Mustard, MD   potassium chloride SA (KLOR-CON M) CR tablet 20-40 mEq, 20-40 mEq, Oral, Daily PRN, Nadara Mustard, MD   rosuvastatin (CRESTOR) tablet 20 mg, 20 mg, Oral, Daily, Nadara Mustard, MD, 20 mg at 06/09/23 5638   senna-docusate (Senokot-S) tablet 1 tablet, 1 tablet, Oral, QHS PRN, Nadara Mustard, MD, 1 tablet at 06/03/23 2235   senna-docusate (Senokot-S) tablet 1 tablet, 1 tablet, Oral, QHS, Nadara Mustard, MD, 1 tablet at 06/07/23 2053   vancomycin (VANCOREADY) IVPB 1250 mg/250 mL, 1,250 mg, Intravenous, BID, Nadara Mustard, MD, Last Rate: 166.7 mL/hr at 06/09/23 0924, 1,250 mg at 06/09/23 7564   zinc sulfate capsule 220 mg, 220 mg, Oral, Daily, Nadara Mustard, MD, 220 mg at 06/09/23 3329     Patients Current Diet:  Diet Order                  Diet - low sodium heart healthy             Diet Carb Modified             Diet Carb Modified Fluid consistency: Thin; Room service appropriate? Yes  Diet  effective now                         Precautions / Restrictions Precautions Precautions: Fall Precaution Comments: chronic L BKA, has prosthetic, new R BKA Restrictions Weight Bearing Restrictions: Yes RLE Weight Bearing: Non weight bearing Other Position/Activity Restrictions: RLE limb protector    Has the patient had 2 or more falls or a fall with injury in the past year?Yes   Prior Activity Level Household: household ambulatory with crutches and prosthesis on LLE.   Prior Functional Level Prior Function Prior Level of Function : Independent/Modified Independent Mobility Comments: working on power WC, using crutches ADLs Comments: independent, not driving, reports not eating much   Self Care: Did the patient need help bathing, dressing, using the toilet or eating?  Independent   Indoor Mobility: Did the patient need assistance with walking from room to room (with or without device)? Independent   Stairs: Did the patient need assistance with internal or external stairs (with or without device)? Independent   Functional Cognition: Did the patient need help planning regular tasks such as shopping or remembering to take medications? Independent   Patient Information Are you of Hispanic, Latino/a,or Spanish origin?: A. No, not of Hispanic, Latino/a, or Spanish origin What is your race?: A. White Do you need or want an interpreter to communicate with a doctor or health care staff?: 0. No   Patient's Response To:  Health Literacy and Transportation Is the patient able to respond to health literacy and transportation needs?: Yes Health Literacy - How often do you need to have someone help you when you read instructions, pamphlets, or other written material from your doctor or pharmacy?: Never In the past 12 months, has lack of transportation kept you from medical appointments or from getting medications?: No In the past 12 months, has lack of transportation kept you from  meetings, work, or from getting things needed for  daily living?: No   Home Assistive Devices / Equipment Home Assistive Devices/Equipment: Medical laboratory scientific officer (specify quad or straight) Home Equipment: Agricultural consultant (2 wheels), Crutches, BSC/3in1, Wheelchair - manual, Cane - single point, Other (comment), Shower seat   Prior Device Use: Indicate devices/aids used by the patient prior to current illness, exacerbation or injury? Orthotics/Prosthetics and crutches   Current Functional Level Cognition   Overall Cognitive Status: Within Functional Limits for tasks assessed Orientation Level: Oriented X4 General Comments: pt with poor safety awareness but anticipate this is baseline personality.    Extremity Assessment (includes Sensation/Coordination)   Upper Extremity Assessment: Overall WFL for tasks assessed  Lower Extremity Assessment: Defer to PT evaluation RLE Deficits / Details: limited hip flexion, suspect due to pain. limited active movement at hip and minimal activation of quad for knee extension RLE: Unable to fully assess due to pain RLE Sensation: WNL RLE Coordination: WNL LLE Deficits / Details: chronic L BKA, full ROM, able to maintain against mod pressure     ADLs   Overall ADL's : Needs assistance/impaired Eating/Feeding: Set up, Sitting Grooming: Set up, Sitting Upper Body Bathing: Set up, Bed level Lower Body Bathing: Set up, Sitting/lateral leans, Bed level Upper Body Dressing : Set up, Sitting Lower Body Dressing: Maximal assistance, Sit to/from stand, Sitting/lateral leans Toilet Transfer: Moderate assistance, +2 for safety/equipment, BSC/3in1, Rolling walker (2 wheels) Toileting- Clothing Manipulation and Hygiene: Maximal assistance, Sitting/lateral lean, Sit to/from stand Toileting - Clothing Manipulation Details (indicate cue type and reason): for peri care Functional mobility during ADLs: Maximal assistance, +2 for physical assistance, +2 for safety/equipment, Cueing for  safety, Cueing for sequencing, Rolling walker (2 wheels) General ADL Comments: Pt able to perform bathing prior to session in bed with set up. Pt completed UB/LB dressing with set up. Pt able to perform transfer to recliner with mod A, increased time using RW.     Mobility   Overal bed mobility: Modified Independent Bed Mobility: Supine to Sit, Sit to Supine Supine to sit: Supervision, HOB elevated, Used rails Sit to supine: Supervision, HOB elevated, Used rails General bed mobility comments: mod I     Transfers   Overall transfer level: Needs assistance Equipment used: Rolling walker (2 wheels) Transfers: Sit to/from Stand, Bed to chair/wheelchair/BSC Sit to Stand: Min assist, +2 safety/equipment, From elevated surface Bed to/from chair/wheelchair/BSC transfer type:: Stand pivot Stand pivot transfers: Mod assist  Lateral/Scoot Transfers: Min assist General transfer comment: Pt able to perform STS with min A from elevated surface, transferred to recliner with use of RW, mod A x2 due to unsteadiness and safety, needs further training on t/f to recliner     Ambulation / Gait / Stairs / Clinical biochemist / Balance Dynamic Sitting Balance Sitting balance - Comments: Pt able to lean L/R as needed, able to don/doff RLE limb protector, and L prosthetic with set up Balance Overall balance assessment: Needs assistance Sitting-balance support: Feet supported Sitting balance-Leahy Scale: Good Sitting balance - Comments: Pt able to lean L/R as needed, able to don/doff RLE limb protector, and L prosthetic with set up Postural control: Posterior lean Standing balance support: During functional activity, Reliant on assistive device for balance Standing balance-Leahy Scale: Poor Standing balance comment: reliant on crutches or RW for support     Special needs/care consideration Continuous Drip IV  vancomycin and unasyn, Wound Vac yes RLE, Skin surgical incision to R residual  limb, and Diabetic management yes  Previous Home Environment (from acute therapy documentation) Living Arrangements: Alone Available Help at Discharge: Family, Available PRN/intermittently Type of Home: Mobile home Home Layout: One level Home Access: Ramped entrance Bathroom Shower/Tub: Engineer, manufacturing systems: Standard Bathroom Accessibility: Yes Home Care Services: No Additional Comments: may go to daughter's house, has 4-5 steps to enter, one level, tub shower   Discharge Living Setting Plans for Discharge Living Setting: Lives with (comment) (daughter) Type of Home at Discharge: Mobile home Discharge Home Layout: One level Discharge Home Access: Stairs to enter Entrance Stairs-Number of Steps: 4-5 Discharge Bathroom Shower/Tub: Tub/shower unit Discharge Bathroom Toilet: Standard Discharge Bathroom Accessibility: Yes How Accessible: Accessible via wheelchair Does the patient have any problems obtaining your medications?: No   Social/Family/Support Systems Anticipated Caregiver: Pt's plan is to d/c to daughters home ([place]) if he does not reach mod I level.  If he is mod I or intermittent mod I, his friend Earley Abide can check on him and she is a CNA [phone]) Anticipated Caregiver's Contact Information: see above Ability/Limitations of Caregiver: supervision only Caregiver Availability: 24/7 Discharge Plan Discussed with Primary Caregiver: Yes Is Caregiver In Agreement with Plan?: Yes Does Caregiver/Family have Issues with Lodging/Transportation while Pt is in Rehab?: No     Goals Patient/Family Goal for Rehab: PT/OT mod I w/c level, SLP n/a Expected length of stay: 9-12 days Additional Information: Discharge plan: if able to reach mod I level can d/c home, otherwise will plan to stay with his daughter at discharge who does not work. Pt/Family Agrees to Admission and willing to participate: Yes Program Orientation Provided & Reviewed with Pt/Caregiver  Including Roles  & Responsibilities: Yes  Barriers to Discharge: Insurance for SNF coverage     Decrease burden of Care through IP rehab admission: n/a     Possible need for SNF placement upon discharge: Not anticipated.  Pt with mod I goals, but able to stay with family at discharge if he does not reach mod I level.      Patient Condition: This patient's condition remains as documented in the consult dated 06/06/23, in which the Rehabilitation Physician determined and documented that the patient's condition is appropriate for intensive rehabilitative care in an inpatient rehabilitation facility. Will admit to inpatient rehab today.   Preadmission Screen Completed By:  Stephania Fragmin, PT, DPT 06/09/2023 12:19 PM ______________________________________________________________________   Discussed status with Dr. Natale Lay on 06/09/23 at 12:19 PM  and received approval for admission today.   Admission Coordinator:  Stephania Fragmin, PT, DPT time 12:19 PM Dorna Bloom 06/09/23              Revision History

## 2023-06-09 NOTE — H&P (Signed)
Physical Medicine and Rehabilitation Admission H&P     CC: Functional deficits secondary to right BKA   HPI: Eugene Diaz is a 57 year old male who presented to the ED on 05/29/2023. He is status post right tendo Achilles lengthening with Chopart amputation of the right foot by Dr. Logan Bores on 04/28/2023 on Augmentin and followed by ID as outpatient. At follow-up on 7/24, there appeared to be complete breakdown of the amputation stump to the right lower extremity. He was instructed to proceed to the ED for further management and consultation to Dr. Lajoyce Corners. His history is significant for DM s/p left BKA in 2021. Plain radiographs of the right lower extremity demonstrated diffuse soft tissue swelling throughout the lower leg and foot, as well as findings concerning for osteomyelitis involving the calcaneus. Multiple antibiotics started and blood cultures obtained. ID consulted. TTE ordered. Podiatry consulted. Per Dr. Ralene Cork, at this time patient would be best served with below knee amputation as infection has spread too far for salvage. Orthopedics consulted for osteomyelitits and abscess of the right foot. Dr. Lajoyce Corners recommended proceeding with right transtibial amputation and this was performed on 06/03/2023. ID recommendations include: Continue Vancomycin and Unasyn inpatient. On amitryptyline 100mg  po HS, low risk for serotonin syndrome, can consider holding while on linezolid. Can switch to Linezolid 600mg  po bid through 8/15 ( strep mitis/oralis)  and amoxicillin 1 g po tid ( proteus and enterococcus) through 8/10 for treatment of secondary polymicrobial bacteremia when discharged. Tolerating diet. OT/PT evals done. Pt reports b/l phantom pain and pain at surgical incision. PDMP review indicates he takes morphine sulfate ER 30 mg twice a day and oxycodone 30mg  PRN at home in additional to alprazolam.  The patient requires inpatient medicine and rehabilitation evaluations and services for ongoing dysfunction  secondary to right BKA and prior left BKA.      Review of Systems  Constitutional:  Negative for fever.  HENT:  Negative for congestion.   Eyes:  Negative for blurred vision.  Respiratory:  Negative for cough.   Cardiovascular:  Negative for chest pain.  Gastrointestinal:  Negative for abdominal pain, nausea and vomiting.       Last BM yesterday  Genitourinary:  Negative for dysuria.  Musculoskeletal:  Positive for joint pain.       Has left prosthesis in room. Appears he may have lost weight/muscle bulk since last fitted.  Skin:  Negative for rash.  Neurological:  Negative for sensory change, speech change and focal weakness.        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) 05/23/2022   Depression     Diabetic neuropathy (HCC)     Edema of both lower extremities     Essential tremor      neurologist-- dr patel--- due to alcohol abuse   GERD (gastroesophageal reflux disease)     History of cellulitis 2020    left lower leg;   recurrent 03-25-2020   History of esophageal stricture      s/p  dilatation 02-13-2018   History of osteomyelitis 2020    left great toe     Hypercholesterolemia     Hypertension  followed by pcp  (nuclear stress test 03-11-2014 low risk w/ no ischemia, ef 65%   Normocytic anemia      followed by pcp   (03-18-2020 had transfusion's 02/ 2021)   Osteomyelitis of great toe of right foot (HCC) 08/18/2021   Peripheral vascular disease (HCC)     Severe depression (HCC) 06/09/2021   Status post incision and drainage followed by Dr Samuella Cota and pcp    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   (03-17-2020  pt states most of incision is healed with exception an area that is still draining,  daily dressing change),  foot is red but not  warm to the touch and has swelling but has improved)   Suicidal ideation 06/09/2021   Syncope and collapse 06/07/2022   Syncope, near 06/20/2022   Thrombocytopenia (HCC)      chronic   Type 2 diabetes mellitus (HCC)      followed by pcp---    (03-18-2020  pt stated does not check blood sugar)   Vaccine counseling 08/29/2022             Past Surgical History:  Procedure Laterality Date   AMPUTATION Left 10/16/2019    Procedure: Left partial second ray resection; placement of antibiotic beads;  Surgeon: Park Liter, DPM;  Location: [facility];  Service: Podiatry;  Laterality: Left;   AMPUTATION Left 11/13/2019    Procedure: Left Midfoot Amputation - Transmetatarsal vs. Lisfranc; Placement antibiotic beads;  Surgeon: Park Liter, DPM;  Location: [facility];  Service: Podiatry;  Laterality: Left;   AMPUTATION Left 12/21/2019    Procedure: Chopart Amputation left foot;  Surgeon: Park Liter, DPM;  Location: [facility];  Service: Podiatry;  Laterality: Left;   AMPUTATION Left 06/24/2020    Procedure: LEFT BELOW KNEE AMPUTATION;  Surgeon: Nadara Mustard, MD;  Location: [facility] OR;  Service: Orthopedics;  Laterality: Left;   AMPUTATION Right 03/29/2021    Procedure: AMPUTATION RAY;  Surgeon: Park Liter, DPM;  Location: [facility];  Service: Podiatry;  Laterality: Right;   AMPUTATION Right 04/28/2023    Procedure: RIGHT PARTIAL FOOT AMPUTATION;  Surgeon: Felecia Shelling, DPM;  Location: [facility];  Service: Podiatry;  Laterality: Right;   AMPUTATION Right 06/03/2023    Procedure: RIGHT BELOW KNEE AMPUTATION;  Surgeon: Nadara Mustard, MD;  Location: [facility] OR;  Service: Orthopedics;  Laterality: Right;   AMPUTATION TOE Left 08/14/2019    Procedure: AMPUTATION GREAT TOE;  Surgeon: Park Liter, DPM;  Location: [facility];  Service: Podiatry;  Laterality: Left;   APPLICATION OF WOUND VAC Left 12/21/2019    Procedure: Application Of Wound Vac;  Surgeon: Park Liter, DPM;  [facility]: [facility];  Service: Podiatry;   Laterality: Left;   APPLICATION OF WOUND VAC Right 06/03/2023    Procedure: APPLICATION OF WOUND VAC;  Surgeon: Nadara Mustard, MD;  Location: [facility];  Service: Orthopedics;  Laterality: Right;   BIOPSY   02/13/2018    Procedure: BIOPSY;  Surgeon: West Bali, MD;  [facility]: [facility];  Service: Endoscopy;;  transverse colon biopsy, gastric biopsy   BONE BIOPSY Right 04/28/2023    Procedure: BONE BIOPSY;  Surgeon: Felecia Shelling, DPM;  Location: [facility];  Service: Podiatry;  Laterality: Right;   COLONOSCOPY WITH PROPOFOL N/A 02/13/2018    Procedure: COLONOSCOPY WITH PROPOFOL;  Surgeon: West Bali, MD;  [facility]: [facility];  Service: Endoscopy;  Laterality: N/A;  11:15am   ESOPHAGOGASTRODUODENOSCOPY (EGD)  WITH PROPOFOL N/A 02/13/2018    Procedure: ESOPHAGOGASTRODUODENOSCOPY (EGD) WITH PROPOFOL;  Surgeon: West Bali, MD;  [facility]: [facility];  Service: Endoscopy;  Laterality: N/A;   I & D EXTREMITY Left 07/16/2019    Procedure: Insicion  AND DEBRIDEMENT EXTREMITY;  Surgeon: Park Liter, DPM;  [facility]: [facility];  Service: Podiatry;  Laterality: Left;   I & D EXTREMITY Left 06/22/2020    Procedure: IRRIGATION AND DEBRIDEMENT EXTREMITY, left foot and ankle;  Surgeon: Park Liter, DPM;  [facility]: [facility];  Service: Podiatry;  Laterality: Left;   I & D EXTREMITY Right 03/29/2021    Procedure: IRRIGATION AND DEBRIDEMENT EXTREMITY;  Surgeon: Park Liter, DPM;  [facility]: [facility];  Service: Podiatry;  Laterality: Right;   INCISION AND DRAINAGE OF WOUND Left 10/13/2019    Procedure: IRRIGATION AND DEBRIDEMENT WOUND OF LEFT FOOT AND FIRST METATARSAL RESECTION;  Surgeon: Park Liter, DPM;  Location: [facility];  Service: Podiatry;  Laterality: Left;   IRRIGATION AND DEBRIDEMENT FOOT Left 11/11/2019    Procedure: Left Foot Wound Irrigation and Debridement;  Surgeon: Park Liter, DPM;  Location: [facility];  Service: Podiatry;  Laterality: Left;   Left arm        Left arm repair (tendon  and artery)   PERCUTANEOUS PINNING   07/16/2019    Procedure: Open Reduction Percutaneous Pinning Extremity;  Surgeon: Park Liter, DPM;  [facility]: [facility];  Service: Podiatry;;   PILONIDAL CYST EXCISION N/A 08/08/2014    Procedure: CYST EXCISION PILONIDAL EXTENSIVE;  Surgeon: Dalia Heading, MD;  Location: [facility];  Service: General;  Laterality: N/A;   POLYPECTOMY   02/13/2018    Procedure: POLYPECTOMY;  Surgeon: West Bali, MD;  [facility];  Service: Endoscopy;;  transverse colon polyp hs, rectal polyps times 2   SAVORY DILATION N/A 02/13/2018    Procedure: SAVORY DILATION;  Surgeon: West Bali, MD;  [facility]: [facility];  Service: Endoscopy;  Laterality: N/A;   TENDON LENGTHENING Right 12/04/2021    Procedure: TENDON LENGTHENING;  Surgeon: Asencion Islam, DPM;  [facility];  Service: Podiatry;  Laterality: Right;   TRANSMETATARSAL AMPUTATION Right 12/04/2021    Procedure: TRANSMETATARSAL AMPUTATION;  Surgeon: Asencion Islam, DPM;  [facility];  Service: Podiatry;  Laterality: Right;   WOUND DEBRIDEMENT Left 09/04/2019    Procedure: DEBRIDEMENT WOUND WITH COMPLEX  REPAIR OF DEHISCENCE;  Surgeon: Park Liter, DPM;  Location: [facility];  Service: Podiatry;  Laterality: Left;  Leave patient on stretcher   WOUND DEBRIDEMENT Left 10/16/2019    Procedure: Left foot wound debridement and closure;  Surgeon: Park Liter, DPM;  Location: [facility];  Service: Podiatry;  Laterality: Left;   WOUND DEBRIDEMENT Left 12/18/2019    Procedure: LEFT FOOT DEBRIDEMENT WITH PARTIAL INCISION OF INFECTED BONE;  Surgeon: Park Liter, DPM;  [facility]: [facility];  Service: Podiatry;  Laterality: Left;  LEFT FOOT DEBRIDEMENT WITH PARTIAL INCISION OF INFECTED BONE   WOUND DEBRIDEMENT Right 04/01/2021    Procedure: Right foot wound debridement and irrigation and closure, bone biopsy;  Surgeon: Park Liter, DPM;  Location: [facility];  Service: Podiatry;  Laterality: Right;              Family History  Problem Relation Age of Onset   Heart attack Mother          Living, 73   Cancer Father  Deceased   Healthy Brother     Healthy Sister     Colon cancer Neg Hx     Colon polyps Neg Hx          Social History:  reports that he has been smoking cigars. He has never used smokeless tobacco. He reports current alcohol use of about 50.0 standard drinks of alcohol per week. He reports that he does not currently use drugs after having used the following drugs: Marijuana. Allergies:  Allergies  No Known Allergies         Medications Prior to Admission  Medication Sig Dispense Refill   acetaminophen (TYLENOL) 325 MG tablet Take 2 tablets (650 mg total) by mouth every 6 (six) hours as needed for mild pain.       albuterol (VENTOLIN HFA) 108 (90 Base) MCG/ACT inhaler Inhale 1-2 puffs into the lungs every 4 (four) hours as needed for wheezing or shortness of breath.       ALPRAZolam (XANAX) 1 MG tablet Take 1 mg by mouth 4 (four) times daily.       amitriptyline (ELAVIL) 100 MG tablet Take 100 mg by mouth at bedtime.       amLODipine (NORVASC) 10 MG tablet Take 1 tablet (10 mg total) by mouth daily. 90 tablet 3   diclofenac Sodium (VOLTAREN) 1 % GEL Apply 4 g topically 4 (four) times daily as needed (Apply to painful area over left upper front chest.). 50 g 0   folic acid (FOLVITE) 1 MG tablet Take 1 tablet (1 mg total) by mouth daily. 30 tablet 0   metFORMIN (GLUCOPHAGE) 500 MG tablet Take 500 mg by mouth 2 (two) times daily.       morphine (MS CONTIN) 30 MG 12 hr tablet Take 30 mg by mouth every 12 (twelve) hours.       Multiple Vitamin (MULTIVITAMIN WITH MINERALS) TABS tablet Take 1 tablet by mouth daily.       oxycodone (ROXICODONE) 30 MG immediate release tablet Take 30 mg by mouth 3 (three) times daily.       rosuvastatin (CRESTOR) 20 MG tablet Take 1 tablet (20 mg total) by mouth daily. 90 tablet 3   thiamine 100 MG tablet Take 1 tablet (100 mg  total) by mouth daily. 30 tablet 0   [DISCONTINUED] senna-docusate (SENOKOT-S) 8.6-50 MG tablet Take 1 tablet by mouth at bedtime as needed for mild constipation.       amoxicillin-clavulanate (AUGMENTIN) 875-125 MG tablet Take 1 tablet by mouth every 12 (twelve) hours. (Patient not taking: Reported on 05/29/2023) 28 tablet 0   doxycycline (VIBRA-TABS) 100 MG tablet Take 1 tablet (100 mg total) by mouth every 12 (twelve) hours. (Patient not taking: Reported on 05/29/2023) 14 tablet 0              Home: Home Living Family/patient expects to be discharged to:: Private residence Living Arrangements: Alone Available Help at Discharge: Family, Available PRN/intermittently Type of Home: Mobile home Home Access: Ramped entrance Home Layout: One level Bathroom Shower/Tub: Engineer, manufacturing systems: Standard Bathroom Accessibility: Yes Home Equipment: Agricultural consultant (2 wheels), Crutches, BSC/3in1, Wheelchair - manual, Cane - single point, Other (comment), Shower seat Additional Comments: may go to daughter's house, has 4-5 steps to enter, one level, tub shower   Functional History: Prior Function Prior Level of Function : Independent/Modified Independent Mobility Comments: working on power WC, using crutches ADLs Comments: independent, not driving, reports not eating much   Functional Status:  Mobility:  Bed Mobility Overal bed mobility: Modified Independent Bed Mobility: Supine to Sit, Sit to Supine Supine to sit: Supervision, HOB elevated, Used rails Sit to supine: Supervision, HOB elevated, Used rails General bed mobility comments: mod I Transfers Overall transfer level: Needs assistance Equipment used: Rolling walker (2 wheels) Transfers: Sit to/from Stand, Bed to chair/wheelchair/BSC Sit to Stand: Min assist, +2 safety/equipment, From elevated surface Bed to/from chair/wheelchair/BSC transfer type:: Stand pivot Stand pivot transfers: Mod assist  Lateral/Scoot Transfers:  Min assist General transfer comment: Pt able to perform STS with min A from elevated surface, transferred to recliner with use of RW, mod A x2 due to unsteadiness and safety, needs further training on t/f to recliner   ADL: ADL Overall ADL's : Needs assistance/impaired Eating/Feeding: Set up, Sitting Grooming: Set up, Sitting Upper Body Bathing: Set up, Bed level Lower Body Bathing: Set up, Sitting/lateral leans, Bed level Upper Body Dressing : Set up, Sitting Lower Body Dressing: Maximal assistance, Sit to/from stand, Sitting/lateral leans Toilet Transfer: Moderate assistance, +2 for safety/equipment, BSC/3in1, Rolling walker (2 wheels) Toileting- Clothing Manipulation and Hygiene: Maximal assistance, Sitting/lateral lean, Sit to/from stand Toileting - Clothing Manipulation Details (indicate cue type and reason): for peri care Functional mobility during ADLs: Maximal assistance, +2 for physical assistance, +2 for safety/equipment, Cueing for safety, Cueing for sequencing, Rolling walker (2 wheels) General ADL Comments: Pt able to perform bathing prior to session in bed with set up. Pt completed UB/LB dressing with set up. Pt able to perform transfer to recliner with mod A, increased time using RW.   Cognition: Cognition Overall Cognitive Status: Within Functional Limits for tasks assessed Orientation Level: Oriented X4 Cognition Arousal: Alert Behavior During Therapy: WFL for tasks assessed/performed Overall Cognitive Status: Within Functional Limits for tasks assessed General Comments: pt with poor safety awareness but anticipate this is baseline personality.   Physical Exam: Blood pressure 137/79, pulse 74, temperature 98.2 F (36.8 C), temperature source Oral, resp. rate 17, height 6' 2.02" (1.88 m), weight 108.6 kg, SpO2 97%.   General: Alert and oriented x 3, No apparent distress HEENT: Head is normocephalic, atraumatic, PERRLA, EOMI, sclera anicteric, oral mucosa pink and  moist, dentition decreased Heart: Reg rate and rhythm. No murmurs rubs or gallops Chest: CTA bilaterally without wheezes, rales, or rhonchi; no distress Abdomen: Soft, non-tender, non-distended, bowel sounds positive. Extremities: Left BKA- wearing BKA- loose fit at socket Right BKA- Prevena removed. Incision well approximated. Dry dressing, shrinker sock and limb protector placed.  Psych: Pt's affect is appropriate. Pt is cooperative, very pleasant Skin: Clean and intact without signs of breakdown Neuro: Follows commands, cranial nerves II through XII grossly intact, normal speech and language Strength 5 out of 5 in bilateral upper extremities Strength 5 out of 5 in left hip flexion knee extension Able to lift right lower extremity to gravity Sensory exam normal for light touch and pain in all 4 limbs. No limb ataxia or cerebellar signs. No abnormal tone appreciated.  Musculoskeletal: No abnormal tone noted.  No joint swelling noted         Lab Results Last 48 Hours        Results for orders placed or performed during the hospital encounter of 05/29/23 (from the past 48 hour(s))  Glucose, capillary     Status: Abnormal    Collection Time: 06/07/23 12:31 PM  Result Value Ref Range    Glucose-Capillary 143 (H) 70 - 99 mg/dL      Comment: Glucose reference range applies only to  samples taken after fasting for at least 8 hours.    Comment 1 Notify RN    Glucose, capillary     Status: Abnormal    Collection Time: 06/07/23  5:06 PM  Result Value Ref Range    Glucose-Capillary 260 (H) 70 - 99 mg/dL      Comment: Glucose reference range applies only to samples taken after fasting for at least 8 hours.    Comment 1 Notify RN    Glucose, capillary     Status: Abnormal    Collection Time: 06/07/23  8:33 PM  Result Value Ref Range    Glucose-Capillary 247 (H) 70 - 99 mg/dL      Comment: Glucose reference range applies only to samples taken after fasting for at least 8 hours.  Glucose,  capillary     Status: Abnormal    Collection Time: 06/08/23 12:11 AM  Result Value Ref Range    Glucose-Capillary 206 (H) 70 - 99 mg/dL      Comment: Glucose reference range applies only to samples taken after fasting for at least 8 hours.  Glucose, capillary     Status: Abnormal    Collection Time: 06/08/23  4:08 AM  Result Value Ref Range    Glucose-Capillary 121 (H) 70 - 99 mg/dL      Comment: Glucose reference range applies only to samples taken after fasting for at least 8 hours.  Glucose, capillary     Status: Abnormal    Collection Time: 06/08/23  8:11 AM  Result Value Ref Range    Glucose-Capillary 209 (H) 70 - 99 mg/dL      Comment: Glucose reference range applies only to samples taken after fasting for at least 8 hours.  Glucose, capillary     Status: Abnormal    Collection Time: 06/08/23 12:05 PM  Result Value Ref Range    Glucose-Capillary 107 (H) 70 - 99 mg/dL      Comment: Glucose reference range applies only to samples taken after fasting for at least 8 hours.  Glucose, capillary     Status: Abnormal    Collection Time: 06/08/23  4:10 PM  Result Value Ref Range    Glucose-Capillary 200 (H) 70 - 99 mg/dL      Comment: Glucose reference range applies only to samples taken after fasting for at least 8 hours.  Glucose, capillary     Status: Abnormal    Collection Time: 06/08/23  8:26 PM  Result Value Ref Range    Glucose-Capillary 208 (H) 70 - 99 mg/dL      Comment: Glucose reference range applies only to samples taken after fasting for at least 8 hours.  Glucose, capillary     Status: Abnormal    Collection Time: 06/09/23 12:28 AM  Result Value Ref Range    Glucose-Capillary 101 (H) 70 - 99 mg/dL      Comment: Glucose reference range applies only to samples taken after fasting for at least 8 hours.  Glucose, capillary     Status: Abnormal    Collection Time: 06/09/23  5:15 AM  Result Value Ref Range    Glucose-Capillary 121 (H) 70 - 99 mg/dL      Comment: Glucose  reference range applies only to samples taken after fasting for at least 8 hours.  Glucose, capillary     Status: Abnormal    Collection Time: 06/09/23  8:22 AM  Result Value Ref Range    Glucose-Capillary 168 (H) 70 - 99 mg/dL  Comment: Glucose reference range applies only to samples taken after fasting for at least 8 hours.  Glucose, capillary     Status: Abnormal    Collection Time: 06/09/23 11:59 AM  Result Value Ref Range    Glucose-Capillary 112 (H) 70 - 99 mg/dL      Comment: Glucose reference range applies only to samples taken after fasting for at least 8 hours.      Imaging Results (Last 48 hours)  No results found.         Blood pressure 137/79, pulse 74, temperature 98.2 F (36.8 C), temperature source Oral, resp. rate 17, height 6' 2.02" (1.88 m), weight 108.6 kg, SpO2 97%.   Medical Problem List and Plan: 1. 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  Antithrombotics: -DVT/anticoagulation:  Pharmaceutical: Lovenox             -antiplatelet therapy: none   3. Pain Management: history of chronic pain (morphine sulfate ER 30 mg tabs #60 filled 05/11/2023, oxycodone 30 mg #120 filled 05/10/2023 Dr. Elfredia Nevins -Tylenol as needed             -continue MS contin 15 mg BID             -Will start gabapentin 100mg  TID for phantom pain              -Amitriptyline on hold due to use of linezolid     4. Mood/Behavior/Sleep: History of anxiety and depression; LCSW to evaluate and provide emotional support             -stop amitriptyline today as starting linezolid -continue Xanax as needed (Xanax 1 mg tabs #120 filled  05/10/2023)             -antipsychotic agents: n/a   5. Neuropsych/cognition: This patient is capable of making decisions on his own behalf.   6. Skin/Wound Care: Routine skin care checks             - Prevena wound VAC was removed 8/9 monitor incision   7. Fluids/Electrolytes/Nutrition: Routine Is and Os and follow-up chemistries             -continue vitamin C, MVI, Juven, zinc, Ensure   8: Hypertension: monitor TID and prn             -continue amlodipine 10 mg -Controlled   9: Hyperlipidemia: continue statin   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: CBGs QID; A1c = 5.6 on 04/27/2023 (home metformin 500 mg BID)             -continue SSI   11: GERD: continue Protonix   12: Left foot osteomyelitis: Per ID>>"Continue Vancomycin and Unasyn inpatient  (will discontinue at CIR admission) On amitryptyline 100mg  po HS, low risk for serotonin syndrome, can consider holding while on linezolid>>done  "Can switch to Linezolid 600mg  po bid through 8/15 ( strep mitis/oralis)  and amoxicillin  1 g po tid ( proteus and enterococcus) through 8/10 for treatment of secondary polymicrobial bacteremia when discharged"   13: Hyponatremia; mild: follow-up BMP   14. CAD in native artery. Denies CP         Milinda Antis, PA-C 06/09/2023   I have personally performed a face to face diagnostic evaluation of this patient and formulated the key components of the plan.  Additionally, I have personally reviewed laboratory data, imaging studies, as well as relevant notes and concur with the physician assistant's documentation above.   The patient's status has not changed from the original H&P.  Any changes in documentation from the acute care chart have been noted above.   Fanny Dance, MD, [place] [facility]

## 2023-06-09 NOTE — Progress Notes (Addendum)
Patient arrived on unit escorted by NT x2. Vitals WNL. L FA PIV flushing and drawing back blood. Skin assessment completed x2 RN skin intact.

## 2023-06-09 NOTE — Progress Notes (Signed)
Inpatient Rehab Admissions Coordinator:    I have insurance approval and a bed available for pt to admit to CIR today. Dr. Hanley Ben in agreement.  Will let pt/family and TOC team know.   Estill Dooms, PT, DPT Admissions Coordinator [phone] 06/09/23  10:01 AM

## 2023-06-09 NOTE — Progress Notes (Signed)
Physical Therapy Treatment Patient Details Name: Eugene Diaz MRN: [id number] DOB: Feb 11, 1966 Today's Date: 06/09/2023   History of Present Illness Pt is a 57 y.o. M who presents 7/29 with R foot pain and wound, no s/p R BKA on 8/3. PMH includes: L BKA, DM II, CAD, depression, anxiety, alcohol abuse, GERD, and PVD.    PT Comments  Pt resting in bed and agreeable to therapy, reports he is considering benefits of AIR vs going home. Educated on benefits of rehab throughout session. Pt Mod Ind with bed mobility and donning limb protector on Rt and prosthesis on Lt. Pt completed sit<>stand with min assist from elevated EOB and able to use UE's on RW to support self with Lt stepping. +2 min assist for safe stand from lower recliner surface and pt able to advance to gait training today with RW; limited in distance by Lt knee pain and fatigue. Will continue to progress pt as able and recommend intense rehab follow up >3 hours/day.    If plan is discharge home, recommend the following: A lot of help with walking and/or transfers;A lot of help with bathing/dressing/bathroom;Assistance with cooking/housework;Assist for transportation;Help with stairs or ramp for entrance   Can travel by private vehicle        Equipment Recommendations  None recommended by PT    Recommendations for Other Services Rehab consult     Precautions / Restrictions Precautions Precautions: Fall Precaution Comments: chronic L BKA, has prosthetic, new R BKA Restrictions Weight Bearing Restrictions: Yes RLE Weight Bearing: Non weight bearing Other Position/Activity Restrictions: RLE limb protector     Mobility  Bed Mobility Overal bed mobility: Modified Independent             General bed mobility comments: mod I    Transfers Overall transfer level: Needs assistance Equipment used: Rolling walker (2 wheels) Transfers: Sit to/from Stand, Bed to chair/wheelchair/BSC Sit to Stand: Min assist, +2  safety/equipment, From elevated surface Stand pivot transfers: Min assist         General transfer comment: Pt able to perform STS with min A from elevated surface, transferred to recliner with use of RW and min assist. Min +2 for rise from lower surface of recliner.    Ambulation/Gait Ambulation/Gait assistance: +2 safety/equipment, Min assist Gait Distance (Feet): 8 Feet Assistive device: Rolling walker (2 wheels) Gait Pattern/deviations: Step-to pattern, Decreased stride length (hop pattern on Lt prosthetic) Gait velocity: decr     General Gait Details: pt able with impressive use of UE's to support weight during gait with hop to pattern on Lt LE prosthesis. Pt amb ~8' and fatigued quickly, reporting some Lt knee pain.   Stairs             Wheelchair Mobility     Tilt Bed    Modified Rankin (Stroke Patients Only)       Balance Overall balance assessment: Needs assistance Sitting-balance support: Feet supported Sitting balance-Leahy Scale: Good Sitting balance - Comments: Pt able to lean L/R as needed, able to don/doff RLE limb protector, and L prosthetic with set up   Standing balance support: During functional activity, Reliant on assistive device for balance Standing balance-Leahy Scale: Poor Standing balance comment: reliant on crutches or RW for support                            Cognition Arousal: Alert Behavior During Therapy: WFL for tasks assessed/performed Overall Cognitive Status: Within Functional  Limits for tasks assessed                                          Exercises      General Comments        Pertinent Vitals/Pain Pain Assessment Pain Assessment: No/denies pain    Home Living                          Prior Function            PT Goals (current goals can now be found in the care plan section) Acute Rehab PT Goals Patient Stated Goal: to return to independence PT Goal Formulation:  With patient Time For Goal Achievement: 06/18/23 Potential to Achieve Goals: Good Progress towards PT goals: Progressing toward goals    Frequency    Min 1X/week      PT Plan Current plan remains appropriate    Co-evaluation              AM-PAC PT "6 Clicks" Mobility   Outcome Measure  Help needed turning from your back to your side while in a flat bed without using bedrails?: None Help needed moving from lying on your back to sitting on the side of a flat bed without using bedrails?: A Little Help needed moving to and from a bed to a chair (including a wheelchair)?: A Little Help needed standing up from a chair using your arms (e.g., wheelchair or bedside chair)?: A Little Help needed to walk in hospital room?: A Lot Help needed climbing 3-5 steps with a railing? : Total 6 Click Score: 16    End of Session Equipment Utilized During Treatment: Gait belt Activity Tolerance: Patient tolerated treatment well Patient left: in chair;with call bell/phone within reach;with chair alarm set Nurse Communication: Mobility status;Patient requests pain meds PT Visit Diagnosis: Other abnormalities of gait and mobility (R26.89);Unsteadiness on feet (R26.81);Muscle weakness (generalized) (M62.81);Pain Pain - Right/Left: Right Pain - part of body: Leg     Time: 6644-0347 PT Time Calculation (min) (ACUTE ONLY): 36 min  Charges:    $Gait Training: 8-22 mins $Therapeutic Activity: 8-22 mins PT General Charges $$ ACUTE PT VISIT: 1 Visit                     Wynn Maudlin, DPT Acute Rehabilitation Services Office [phone]  06/09/23 2:34 PM

## 2023-06-09 NOTE — Discharge Summary (Signed)
Physician Discharge Summary  Eugene Diaz NWG:[id number] DOB: 07/12/1966 DOA: 05/29/2023  PCP: Elfredia Nevins, MD  Admit date: 05/29/2023 Discharge date: 06/09/2023  Admitted From: Home Disposition: CIR  Recommendations for Outpatient Follow-up:  Follow up with CIR provider at earliest convenience Outpatient follow-up with orthopedic/Dr. Lajoyce Corners.  Wound/wound VAC care as per Dr. Audrie Lia recommendations.    Home Health: No Equipment/Devices: None  Discharge Condition: Stable CODE STATUS: Full Diet recommendation: Heart healthy/carb modified  Brief/Interim Summary: 57 year old male with history of obesity, diabetes mellitus type 2, hypertension, left BKA and recent right [place]op[place] amputation presented with fever, chills and drainage/dehiscence of his recent right foot surgical wound. He was started on broad-spectrum IV antibiotics. He was subsequently found to have Proteus and Enterococcus bacteremia. ID was consulted. 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 Lajoyce Corners. Currently on IV Unasyn and vancomycin. ID recommended that patient can be switched to linezolid through 06/15/2023 and amoxicillin through 06/10/2023 when ready for discharge.  He is currently medically stable for discharge.  He will be discharged to CIR once bed is available.  Discharge Diagnoses:   Sepsis: Present on admission Enterococcus and Proteus bacteremia Acute right calcaneal osteomyelitis -Repeat blood cultures have been negative so far.  2D echo showed thickening of aortic valve.  No need for TEE.  Currently afebrile and hemodynamically stable.   -Currently on IV Unasyn and vancomycin as per ID.  ID recommended that patient can be switched to linezolid through 06/15/2023 and amoxicillin through 06/10/2023 when ready for discharge. -underwent right BKA on 06/03/2023 by Dr. Lajoyce Corners.  Wound and wound VAC care and pain management as per orthopedics recommendations. -PT recommended CIR placement.  CIR following.  Currently  medically stable for discharge to CIR -He will be discharged to CIR once bed is available.   Hypersomnolence -Much improved after MS Contin dose was decreased from 30 mg twice a day to 15 mg twice a day.   Chronic pain with opiate dependence -MS Contin plan as above.  Continue as needed oxycodone.  Continue bowel regimen   GAD  -Continue as needed Xanax   Essential hypertension CAD in native artery Hyperlipidemia -Blood pressure currently controlled.  Continue amlodipine and rosuvastatin   Diabetes mellitus type 2 with peripheral neuropathy -Carb modified diet.  Resume metformin.  Continue amitriptyline   Hyponatremia -No recent labs.  Monitor intermittently as an outpatient.   Obesity -Outpatient follow-up  Discharge Instructions  Discharge Instructions     Diet - low sodium heart healthy   Complete by: As directed    Diet Carb Modified   Complete by: As directed    Discharge wound care:   Complete by: As directed    Wound care as per orthopedics recommendations      Allergies as of 06/09/2023   No Known Allergies      Medication List     STOP taking these medications    amoxicillin-clavulanate 875-125 MG tablet Commonly known as: AUGMENTIN   doxycycline 100 MG tablet Commonly known as: VIBRA-TABS       TAKE these medications    acetaminophen 325 MG tablet Commonly known as: TYLENOL Take 2 tablets (650 mg total) by mouth every 6 (six) hours as needed for mild pain.   albuterol 108 (90 Base) MCG/ACT inhaler Commonly known as: VENTOLIN HFA Inhale 1-2 puffs into the lungs every 4 (four) hours as needed for wheezing or shortness of breath.   ALPRAZolam 1 MG tablet Commonly known as: XANAX Take 1 tablet (1 mg  total) by mouth 4 (four) times daily as needed for anxiety or sleep. What changed:  when to take this reasons to take this   amitriptyline 100 MG tablet Commonly known as: ELAVIL Take 100 mg by mouth at bedtime.   amLODipine 10 MG  tablet Commonly known as: NORVASC Take 1 tablet (10 mg total) by mouth daily.   amoxicillin 500 MG tablet Commonly known as: AMOXIL Take 2 tablets (1,000 mg total) by mouth 3 (three) times daily for 2 days. Till 06/10/2023   ascorbic acid 1000 MG tablet Commonly known as: VITAMIN C Take 1 tablet (1,000 mg total) by mouth daily. Start taking on: June 10, 2023   diclofenac Sodium 1 % Gel Commonly known as: VOLTAREN Apply 4 g topically 4 (four) times daily as needed (Apply to painful area over left upper front chest.).   folic acid 1 MG tablet Commonly known as: FOLVITE Take 1 tablet (1 mg total) by mouth daily.   linezolid 600 MG tablet Commonly known as: ZYVOX Take 1 tablet (600 mg total) by mouth 2 (two) times daily for 7 days. Till 06/15/2023   metFORMIN 500 MG tablet Commonly known as: GLUCOPHAGE Take 500 mg by mouth 2 (two) times daily.   morphine 15 MG 12 hr tablet Commonly known as: MS CONTIN Take 1 tablet (15 mg total) by mouth every 12 (twelve) hours. What changed:  medication strength how much to take   multivitamin with minerals Tabs tablet Take 1 tablet by mouth daily.   oxycodone 30 MG immediate release tablet Commonly known as: ROXICODONE Take 1 tablet (30 mg total) by mouth every 8 (eight) hours as needed for severe pain. What changed:  when to take this reasons to take this   polyethylene glycol 17 g packet Commonly known as: MIRALAX / GLYCOLAX Take 17 g by mouth daily. Start taking on: June 10, 2023   rosuvastatin 20 MG tablet Commonly known as: CRESTOR Take 1 tablet (20 mg total) by mouth daily.   senna-docusate 8.6-50 MG tablet Commonly known as: Senokot-S Take 1 tablet by mouth 2 (two) times daily. What changed:  when to take this reasons to take this   thiamine 100 MG tablet Commonly known as: VITAMIN B1 Take 1 tablet (100 mg total) by mouth daily.   zinc sulfate 220 (50 Zn) MG capsule Take 1 capsule (220 mg total) by mouth  daily. Start taking on: June 10, 2023               Discharge Care Instructions  (From admission, onward)           Start     Ordered   06/09/23 0000  Discharge wound care:       Comments: Wound care as per orthopedics recommendations   06/09/23 1039              Follow-up Information     Nadara Mustard, MD Follow up in 1 week(s).   Specialty: Orthopedic Surgery Contact information: [address] [place] [address] [phone]                No Known Allergies  Consultations: Orthopedics/ID/podiatry    Procedures/Studies: PERIPHERAL VASCULAR CATHETERIZATION  Result Date: 06/05/2023 See surgical note for result.  ECHOCARDIOGRAM COMPLETE  Result Date: 05/31/2023    ECHOCARDIOGRAM REPORT   Patient Name:   MERDITH LUCIBELLO Date of Exam: 05/31/2023 Medical Rec #:  [id number]       Height:  74.0 in Accession #:    [id number]      Weight:       239.4 lb Date of Birth:  01-10-1966       BSA:          2.346 m Patient Age:    57 years        BP:           110/65 mmHg Patient Gender: M               HR:           82 bpm. Exam Location:  Inpatient Procedure: 2D Echo, Cardiac Doppler and Color Doppler Indications:    Bacteremia  History:        Patient has prior history of Echocardiogram examinations, most                 recent 03/03/2022. CAD, PAD, Signs/Symptoms:Fever and Edema; Risk                 Factors:Diabetes, Dyslipidemia, Hypertension, Current Smoker and                 alcohol abuse, cellulitis. Sepsis, recent surgery.  Sonographer:    Wallie Char Referring Phys: [id number] [facility]  Sonographer Comments: Technically difficult study due to poor echo windows. Image acquisition challenging due to respiratory motion. IMPRESSIONS  1. Left ventricular ejection fraction, by estimation, is 60 to 65%. The left ventricle has normal function. The left ventricle has no regional wall motion abnormalities. Left ventricular diastolic parameters are  indeterminate.  2. Right ventricular systolic function is normal. The right ventricular size is normal.  3. The mitral valve is normal in structure. No evidence of mitral valve regurgitation. No evidence of mitral stenosis.  4. The aortic valve is normal in structure. There is moderate calcification of the aortic valve. There is moderate thickening of the aortic valve. Aortic valve regurgitation is not visualized. Aortic valve sclerosis is present, with no evidence of aortic valve stenosis.  5. The inferior vena cava is normal in size with greater than 50% respiratory variability, suggesting right atrial pressure of 3 mmHg. Conclusion(s)/Recommendation(s): No evidence of valvular vegetations on this transthoracic echocardiogram. Consider a transesophageal echocardiogram to exclude infective endocarditis if clinically indicated. FINDINGS  Left Ventricle: Left ventricular ejection fraction, by estimation, is 60 to 65%. The left ventricle has normal function. The left ventricle has no regional wall motion abnormalities. The left ventricular internal cavity size was normal in size. There is  no left ventricular hypertrophy. Left ventricular diastolic parameters are indeterminate. Right Ventricle: The right ventricular size is normal. No increase in right ventricular wall thickness. Right ventricular systolic function is normal. Left Atrium: Left atrial size was normal in size. Right Atrium: Right atrial size was normal in size. Pericardium: There is no evidence of pericardial effusion. Mitral Valve: The mitral valve is normal in structure. No evidence of mitral valve regurgitation. No evidence of mitral valve stenosis. MV peak gradient, 3.4 mmHg. The mean mitral valve gradient is 2.0 mmHg. Tricuspid Valve: The tricuspid valve is normal in structure. Tricuspid valve regurgitation is not demonstrated. No evidence of tricuspid stenosis. Aortic Valve: The aortic valve is normal in structure. There is moderate calcification  of the aortic valve. There is moderate thickening of the aortic valve. Aortic valve regurgitation is not visualized. Aortic valve sclerosis is present, with no evidence of aortic valve stenosis. Aortic valve mean gradient measures 5.5 mmHg. Aortic valve peak gradient measures 10.4  mmHg. Aortic valve area, by VTI measures 4.09 cm. Pulmonic Valve: The pulmonic valve was normal in structure. Pulmonic valve regurgitation is not visualized. No evidence of pulmonic stenosis. Aorta: The aortic root is normal in size and structure. Venous: The inferior vena cava is normal in size with greater than 50% respiratory variability, suggesting right atrial pressure of 3 mmHg. IAS/Shunts: No atrial level shunt detected by color flow Doppler.  LEFT VENTRICLE PLAX 2D LVIDd:         4.80 cm      Diastology LVIDs:         3.10 cm      LV e' medial:    8.90 cm/s LV PW:         1.10 cm      LV E/e' medial:  11.3 LV IVS:        1.10 cm      LV e' lateral:   10.80 cm/s LVOT diam:     2.60 cm      LV E/e' lateral: 9.4 LV SV:         131 LV SV Index:   56 LVOT Area:     5.31 cm  LV Volumes (MOD) LV vol d, MOD A2C: 120.0 ml LV vol d, MOD A4C: 122.0 ml LV vol s, MOD A2C: 44.9 ml LV vol s, MOD A4C: 46.3 ml LV SV MOD A2C:     75.1 ml LV SV MOD A4C:     122.0 ml LV SV MOD BP:      77.9 ml RIGHT VENTRICLE             IVC RV Basal diam:  3.30 cm     IVC diam: 1.90 cm RV S prime:     13.70 cm/s TAPSE (M-mode): 1.8 cm LEFT ATRIUM             Index        RIGHT ATRIUM           Index LA diam:        3.90 cm 1.66 cm/m   RA Area:     15.80 cm LA Vol (A2C):   39.1 ml 16.67 ml/m  RA Volume:   41.60 ml  17.73 ml/m LA Vol (A4C):   47.8 ml 20.37 ml/m LA Biplane Vol: 46.5 ml 19.82 ml/m  AORTIC VALVE AV Area (Vmax):    3.97 cm AV Area (Vmean):   3.90 cm AV Area (VTI):     4.09 cm AV Vmax:           161.00 cm/s AV Vmean:          112.500 cm/s AV VTI:            0.322 m AV Peak Grad:      10.4 mmHg AV Mean Grad:      5.5 mmHg LVOT Vmax:         120.50  cm/s LVOT Vmean:        82.600 cm/s LVOT VTI:          0.248 m LVOT/AV VTI ratio: 0.77  AORTA Ao Root diam: 3.60 cm Ao Asc diam:  3.90 cm MITRAL VALVE                TRICUSPID VALVE MV Area (PHT): 3.53 cm     TR Peak grad:   12.1 mmHg MV Area VTI:   5.03 cm     TR Vmax:  174.00 cm/s MV Peak grad:  3.4 mmHg MV Mean grad:  2.0 mmHg     SHUNTS MV Vmax:       0.92 m/s     Systemic VTI:  0.25 m MV Vmean:      63.4 cm/s    Systemic Diam: 2.60 cm MV Decel Time: 215 msec MV E velocity: 101.00 cm/s MV A velocity: 82.10 cm/s MV E/A ratio:  1.23 Donato Schultz MD Electronically signed by Donato Schultz MD Signature Date/Time: 05/31/2023/11:32:14 AM    Final    DG Tibia/Fibula Right  Result Date: 05/29/2023 CLINICAL DATA:  Right hindfoot amputation, drainage from surgical wound EXAM: RIGHT TIBIA AND FIBULA - 2 VIEW COMPARISON:  04/28/2023 FINDINGS: Frontal and lateral views of the right tibia and fibula are obtained. Postsurgical changes are seen from amputation at the level of the right hindfoot. Resolution of subcutaneous gas at the surgical site. There is increased soft tissue swelling at the amputation margins. Skin staples are again identified. Cortical irregularity along the distal margin of the calcaneus has developed since prior exam, and developing osteomyelitis is a concern. The right tibia and fibula are unremarkable. The knee and ankle are in anatomic alignment. There is diffuse subcutaneous edema throughout the right lower leg. IMPRESSION: 1. Findings concerning for developing osteomyelitis of the distal margin of the calcaneus along the prior hindfoot amputation site. 2. Diffuse soft tissue swelling throughout the right lower leg and foot. Electronically Signed   By: Sharlet Salina M.D.   On: 05/29/2023 17:56      Subjective: Patient seen and examined at bedside.  Denies worsening shortness breath, fever or vomiting.  Still complains of intermittent lower extremity pain.   Discharge Exam: Vitals:    06/09/23 0517 06/09/23 0824  BP: 131/78 137/79  Pulse: 76 74  Resp:  17  Temp: 98.1 F (36.7 C) 98.2 F (36.8 C)  SpO2: 98% 97%    General: No acute distress.  On room air currently.  Chronically ill and deconditioned looking.  Flat affect.  Slow to respond. respiratory: Bilateral decreased breath sounds at bases with scattered crackles CVS: S1 and S2 are heard; rate mostly controlled abdominal: Soft, obese, nontender, distended slightly; no organomegaly; normal bowel sounds heard.   Extremities: Has right BKA with dressing and wound VAC.  Left BKA    The results of significant diagnostics from this hospitalization (including imaging, microbiology, ancillary and laboratory) are listed below for reference.     Microbiology: Recent Results (from the past 240 hour(s))  Culture, blood (Routine X 2) w Reflex to ID Panel     Status: None   Collection Time: 05/31/23  6:17 AM   Specimen: BLOOD  Result Value Ref Range Status   Specimen Description BLOOD LEFT ANTECUBITAL  Final   Special Requests   Final    BOTTLES DRAWN AEROBIC AND ANAEROBIC Blood Culture adequate volume   Culture   Final    NO GROWTH 5 DAYS Performed at [facility], [address], [place], [place] [address]    Report Status 06/05/2023 FINAL  Final  Culture, blood (Routine X 2) w Reflex to ID Panel     Status: None   Collection Time: 05/31/23  6:17 AM   Specimen: BLOOD LEFT HAND  Result Value Ref Range Status   Specimen Description BLOOD LEFT HAND  Final   Special Requests   Final    BOTTLES DRAWN AEROBIC AND ANAEROBIC Blood Culture adequate volume   Culture  Final    NO GROWTH 5 DAYS Performed at [facility], [address], [place], [place] [address]    Report Status 06/05/2023 FINAL  Final     Labs: BNP (last 3 results) No results for input(s): "BNP" in the last 8760 hours. Basic Metabolic Panel: Recent Labs  Lab 06/04/23 0757  NA 133*  K 4.1  CL 104  CO2 21*  GLUCOSE 157*  BUN  21*  CREATININE 0.82  CALCIUM 8.6*   Liver Function Tests: No results for input(s): "AST", "ALT", "ALKPHOS", "BILITOT", "PROT", "ALBUMIN" in the last 168 hours. No results for input(s): "LIPASE", "AMYLASE" in the last 168 hours. No results for input(s): "AMMONIA" in the last 168 hours. CBC: No results for input(s): "WBC", "NEUTROABS", "HGB", "HCT", "MCV", "PLT" in the last 168 hours. Cardiac Enzymes: No results for input(s): "CKTOTAL", "CKMB", "CKMBINDEX", "TROPONINI" in the last 168 hours. BNP: Invalid input(s): "POCBNP" CBG: Recent Labs  Lab 06/08/23 1610 06/08/23 2026 06/09/23 0028 06/09/23 0515 06/09/23 0822  GLUCAP 200* 208* 101* 121* 168*   D-Dimer No results for input(s): "DDIMER" in the last 72 hours. Hgb A1c No results for input(s): "HGBA1C" in the last 72 hours. Lipid Profile No results for input(s): "CHOL", "HDL", "LDLCALC", "TRIG", "CHOLHDL", "LDLDIRECT" in the last 72 hours. Thyroid function studies No results for input(s): "TSH", "T4TOTAL", "T3FREE", "THYROIDAB" in the last 72 hours.  Invalid input(s): "FREET3" Anemia work up No results for input(s): "VITAMINB12", "FOLATE", "FERRITIN", "TIBC", "IRON", "RETICCTPCT" in the last 72 hours. Urinalysis    Component Value Date/Time   COLORURINE STRAW (A) 03/29/2021 1705   APPEARANCEUR CLEAR 03/29/2021 1705   LABSPEC 1.010 03/29/2021 1705   PHURINE 6.0 03/29/2021 1705   GLUCOSEU NEGATIVE 03/29/2021 1705   HGBUR MODERATE (A) 03/29/2021 1705   BILIRUBINUR NEGATIVE 03/29/2021 1705   KETONESUR NEGATIVE 03/29/2021 1705   PROTEINUR NEGATIVE 03/29/2021 1705   UROBILINOGEN 4.0 (H) 10/09/2008 1114   NITRITE NEGATIVE 03/29/2021 1705   LEUKOCYTESUR NEGATIVE 03/29/2021 1705   Sepsis Labs No results for input(s): "WBC" in the last 168 hours.  Invalid input(s): "PROCALCITONIN", "LACTICIDVEN" Microbiology Recent Results (from the past 240 hour(s))  Culture, blood (Routine X 2) w Reflex to ID Panel     Status: None    Collection Time: 05/31/23  6:17 AM   Specimen: BLOOD  Result Value Ref Range Status   Specimen Description BLOOD LEFT ANTECUBITAL  Final   Special Requests   Final    BOTTLES DRAWN AEROBIC AND ANAEROBIC Blood Culture adequate volume   Culture   Final    NO GROWTH 5 DAYS Performed at [facility], [address] [place], [place] [address]    Report Status 06/05/2023 FINAL  Final  Culture, blood (Routine X 2) w Reflex to ID Panel     Status: None   Collection Time: 05/31/23  6:17 AM   Specimen: BLOOD LEFT HAND  Result Value Ref Range Status   Specimen Description BLOOD LEFT HAND  Final   Special Requests   Final    BOTTLES DRAWN AEROBIC AND ANAEROBIC Blood Culture adequate volume   Culture   Final    NO GROWTH 5 DAYS Performed at [facility], [address], [place], [place] [address]    Report Status 06/05/2023 FINAL  Final     Time coordinating discharge: 35 minutes  SIGNED:   Glade Lloyd, MD  [facility] 06/09/2023, 10:39 AM

## 2023-06-09 NOTE — Progress Notes (Signed)
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  He was started on broad-spectrum IV antibiotics.  He was subsequently found to have Proteus and Enterococcus bacteremia.  ID was consulted.  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 Lajoyce Corners.  Currently on IV Unasyn and vancomycin.  ID recommended that patient can be switched to linezolid through 06/15/2023 and amoxicillin through 06/10/2023 when ready for discharge.   Assessment & Plan:   Sepsis: Present on admission Enterococcus and Proteus bacteremia Acute right calcaneal osteomyelitis -Repeat blood cultures have been negative so far.  2D echo showed thickening of aortic valve.  No need for TEE.  Currently afebrile and hemodynamically stable.   -Currently on IV Unasyn and vancomycin as per ID.  ID recommended that patient can be switched to linezolid through 06/15/2023 and amoxicillin through 06/10/2023 when ready for discharge. -underwent right BKA on 06/03/2023 by Dr. Lajoyce Corners.  Wound and wound VAC care and pain management as per orthopedics recommendations. -PT recommended CIR placement.  CIR following.  Currently medically stable for discharge to CIR  Hypersomnolence -Much improved after MS Contin dose was decreased from 30 mg twice a day to 15 mg twice a day.  Chronic pain with opiate dependence -MS Contin plan as above.  Continue as needed oxycodone.  Continue bowel regimen  GAD  -Continue as needed Xanax  Essential hypertension CAD in native artery Hyperlipidemia -Blood pressure currently controlled.  Continue amlodipine and rosuvastatin  Diabetes mellitus type 2 with peripheral neuropathy -Carb modified diet.  Continue CBGs with SSI.   Continue amitriptyline  Hyponatremia -No recent labs.  Obesity -Outpatient follow-up  DVT prophylaxis: Lovenox Code Status: Full Family Communication: None at bedside Disposition Plan: Status is: Inpatient Remains inpatient appropriate because: Of severity of illness.  Need for CIR placement.  Currently medically stable for discharge to CIR.  Consultants: Orthopedics/ID/podiatry  Procedures: As above  Antimicrobials:  Anti-infectives (From admission, onward)    Start     Dose/Rate Route Frequency Ordered Stop   06/03/23 1200  ceFAZolin (ANCEF) IVPB 2g/100 mL premix  Status:  Discontinued        2 g 200 mL/hr over 30 Minutes Intravenous Every 8 hours 06/03/23 1105 06/03/23 1211   06/03/23 0800  ceFAZolin (ANCEF) IVPB 2g/100 mL premix        2 g 200 mL/hr over 30 Minutes Intravenous On call to O.R. 06/03/23 5621 06/03/23 0925   06/03/23 0749  ceFAZolin (ANCEF) 2-4 GM/100ML-% IVPB       Note to Pharmacy: Eugene Hacker M: cabinet override      06/03/23 0749 06/03/23 0931   06/03/23 0715  vancomycin (VANCOREADY) IVPB 1500 mg/300 mL  Status:  Discontinued        1,500 mg 150 mL/hr over 120 Minutes Intravenous On call to O.R. 06/03/23 3086 06/03/23 0638   06/02/23 1315  vancomycin (VANCOREADY) IVPB 1250 mg/250 mL        1,250 mg 166.7 mL/hr over 90 Minutes Intravenous 2 times daily 06/02/23 1222     05/31/23 1800  Ampicillin-Sulbactam (UNASYN) 3 g in sodium chloride 0.9 % 100 mL IVPB        3 g 200 mL/hr over 30 Minutes Intravenous Every 6 hours 05/31/23 1224  05/30/23 1000  vancomycin (VANCOREADY) IVPB 1250 mg/250 mL  Status:  Discontinued        1,250 mg 166.7 mL/hr over 90 Minutes Intravenous Every 12 hours 05/29/23 2047 05/31/23 1224   05/29/23 2100  vancomycin (VANCOCIN) 2,500 mg in sodium chloride 0.9 % 500 mL IVPB        2,500 mg 262.5 mL/hr over 120 Minutes Intravenous  Once 05/29/23 2047 05/29/23 2335   05/29/23 2000  cefTRIAXone (ROCEPHIN) 2 g in sodium chloride  0.9 % 100 mL IVPB  Status:  Discontinued        2 g 200 mL/hr over 30 Minutes Intravenous Every 24 hours 05/29/23 1953 05/31/23 1224   05/29/23 2000  metroNIDAZOLE (FLAGYL) tablet 500 mg  Status:  Discontinued        500 mg Oral Every 12 hours 05/29/23 1953 05/31/23 1224   05/29/23 1930  piperacillin-tazobactam (ZOSYN) IVPB 3.375 g  Status:  Discontinued        3.375 g 100 mL/hr over 30 Minutes Intravenous  Once 05/29/23 1929 05/29/23 1953        Subjective: Patient seen and examined at bedside.  Denies worsening shortness breath, fever or vomiting.  Still complains of intermittent lower extremity pain. Objective: Vitals:   06/08/23 0815 06/08/23 1608 06/08/23 2028 06/09/23 0517  BP: 114/68 116/64 127/75 131/78  Pulse: 73 73 66 76  Resp: 18 18 17    Temp: 97.8 F (36.6 C)  98 F (36.7 C) 98.1 F (36.7 C)  TempSrc: Oral  Oral Oral  SpO2: 98% 99% 98% 98%  Weight:      Height:        Intake/Output Summary (Last 24 hours) at 06/09/2023 0745 Last data filed at 06/09/2023 0532 Gross per 24 hour  Intake 1130 ml  Output 2900 ml  Net -1770 ml   Filed Weights   05/29/23 1556 05/29/23 2357 06/03/23 0801  Weight: 108.9 kg 108.6 kg 108.6 kg    Examination:  General: No acute distress.  On room air currently.  Chronically ill and deconditioned looking.  Flat affect.  Slow to respond. respiratory: Bilateral decreased breath sounds at bases with scattered crackles CVS: S1 and S2 are heard; rate mostly controlled abdominal: Soft, obese, nontender, distended slightly; no organomegaly; normal bowel sounds heard.   Extremities: Has right BKA with dressing and wound VAC.  Left BKA    Data Reviewed: I have personally reviewed following labs and imaging studies  CBC: No results for input(s): "WBC", "NEUTROABS", "HGB", "HCT", "MCV", "PLT" in the last 168 hours.  Basic Metabolic Panel: Recent Labs  Lab 06/04/23 0757  NA 133*  K 4.1  CL 104  CO2 21*  GLUCOSE 157*  BUN 21*   CREATININE 0.82  CALCIUM 8.6*   GFR: Estimated Creatinine Clearance: 128.9 mL/min (by C-G formula based on SCr of 0.82 mg/dL). Liver Function Tests: No results for input(s): "AST", "ALT", "ALKPHOS", "BILITOT", "PROT", "ALBUMIN" in the last 168 hours. No results for input(s): "LIPASE", "AMYLASE" in the last 168 hours. No results for input(s): "AMMONIA" in the last 168 hours. Coagulation Profile: No results for input(s): "INR", "PROTIME" in the last 168 hours. Cardiac Enzymes: No results for input(s): "CKTOTAL", "CKMB", "CKMBINDEX", "TROPONINI" in the last 168 hours. BNP (last 3 results) No results for input(s): "PROBNP" in the last 8760 hours. HbA1C: No results for input(s): "HGBA1C" in the last 72 hours. CBG: Recent Labs  Lab 06/08/23 1205 06/08/23 1610 06/08/23 2026 06/09/23 0028 06/09/23 0515  GLUCAP 107*  200* 208* 101* 121*   Lipid Profile: No results for input(s): "CHOL", "HDL", "LDLCALC", "TRIG", "CHOLHDL", "LDLDIRECT" in the last 72 hours. Thyroid Function Tests: No results for input(s): "TSH", "T4TOTAL", "FREET4", "T3FREE", "THYROIDAB" in the last 72 hours. Anemia Panel: No results for input(s): "VITAMINB12", "FOLATE", "FERRITIN", "TIBC", "IRON", "RETICCTPCT" in the last 72 hours. Sepsis Labs: No results for input(s): "PROCALCITON", "LATICACIDVEN" in the last 168 hours.  Recent Results (from the past 240 hour(s))  Culture, blood (Routine X 2) w Reflex to ID Panel     Status: None   Collection Time: 05/31/23  6:17 AM   Specimen: BLOOD  Result Value Ref Range Status   Specimen Description BLOOD LEFT ANTECUBITAL  Final   Special Requests   Final    BOTTLES DRAWN AEROBIC AND ANAEROBIC Blood Culture adequate volume   Culture   Final    NO GROWTH 5 DAYS Performed at [facility], [address] [place], [place] [address]    Report Status 06/05/2023 FINAL  Final  Culture, blood (Routine X 2) w Reflex to ID Panel     Status: None   Collection Time: 05/31/23   6:17 AM   Specimen: BLOOD LEFT HAND  Result Value Ref Range Status   Specimen Description BLOOD LEFT HAND  Final   Special Requests   Final    BOTTLES DRAWN AEROBIC AND ANAEROBIC Blood Culture adequate volume   Culture   Final    NO GROWTH 5 DAYS Performed at [facility], [address] [place], [place] [address]    Report Status 06/05/2023 FINAL  Final         Radiology Studies: No results found.      Scheduled Meds:  amitriptyline  100 mg Oral QHS   amLODipine  10 mg Oral Daily   vitamin C  1,000 mg Oral Daily   docusate sodium  100 mg Oral Daily   enoxaparin (LOVENOX) injection  40 mg Subcutaneous Q24H   feeding supplement  237 mL Oral BID BM   insulin aspart  0-6 Units Subcutaneous Q4H   morphine  15 mg Oral Q12H   multivitamin with minerals  1 tablet Oral Daily   nutrition supplement (JUVEN)  1 packet Oral BID BM   pantoprazole  40 mg Oral Daily   polyethylene glycol  17 g Oral Daily   rosuvastatin  20 mg Oral Daily   senna-docusate  1 tablet Oral QHS   zinc sulfate  220 mg Oral Daily   Continuous Infusions:  ampicillin-sulbactam (UNASYN) IV 3 g (06/09/23 0532)   magnesium sulfate bolus IVPB     vancomycin 1,250 mg (06/08/23 2147)          Glade Lloyd, MD [facility] 06/09/2023, 7:45 AM

## 2023-06-09 NOTE — H&P (Signed)
Physical Medicine and Rehabilitation Admission H&P   CC: Functional deficits secondary to right BKA  HPI: Eugene Diaz is a 57 year old male who presented to the ED on 05/29/2023. He is status post right tendo Achilles lengthening with Chopart amputation of the right foot by Dr. Logan Bores on 04/28/2023 on Augmentin and followed by ID as outpatient. At follow-up on 7/24, there appeared to be complete breakdown of the amputation stump to the right lower extremity. He was instructed to proceed to the ED for further management and consultation to Dr. Lajoyce Corners. His history is significant for DM s/p left BKA in 2021. Plain radiographs of the right lower extremity demonstrated diffuse soft tissue swelling throughout the lower leg and foot, as well as findings concerning for osteomyelitis involving the calcaneus. Multiple antibiotics started and blood cultures obtained. ID consulted. TTE ordered. Podiatry consulted. Per Dr. Ralene Cork, at this time patient would be best served with below knee amputation as infection has spread too far for salvage. Orthopedics consulted for osteomyelitits and abscess of the right foot. Dr. Lajoyce Corners recommended proceeding with right transtibial amputation and this was performed on 06/03/2023. ID recommendations include: Continue Vancomycin and Unasyn inpatient. On amitryptyline 100mg  po HS, low risk for serotonin syndrome, can consider holding while on linezolid. Can switch to Linezolid 600mg  po bid through 8/15 ( strep mitis/oralis)  and amoxicillin 1 g po tid ( proteus and enterococcus) through 8/10 for treatment of secondary polymicrobial bacteremia when discharged. Tolerating diet. OT/PT evals done. Pt reports b/l phantom pain and pain at surgical incision. PDMP review indicates he takes morphine sulfate ER 30 mg twice a day and oxycodone 30mg  PRN at home in additional to alprazolam.  The patient requires inpatient medicine and rehabilitation evaluations and services for ongoing dysfunction secondary  to right BKA and prior left BKA.    Review of Systems  Constitutional:  Negative for fever.  HENT:  Negative for congestion.   Eyes:  Negative for blurred vision.  Respiratory:  Negative for cough.   Cardiovascular:  Negative for chest pain.  Gastrointestinal:  Negative for abdominal pain, nausea and vomiting.       Last BM yesterday  Genitourinary:  Negative for dysuria.  Musculoskeletal:  Positive for joint pain.       Has left prosthesis in room. Appears he may have lost weight/muscle bulk since last fitted.  Skin:  Negative for rash.  Neurological:  Negative for sensory change, speech change and focal weakness.   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) 05/23/2022   Depression    Diabetic neuropathy (HCC)    Edema of both lower extremities    Essential tremor    neurologist-- dr patel--- due to alcohol abuse   GERD (gastroesophageal reflux disease)    History of cellulitis 2020   left lower leg;   recurrent 03-25-2020   History of esophageal stricture    s/p  dilatation 02-13-2018   History of osteomyelitis 2020   left great toe     Hypercholesterolemia    Hypertension    followed by pcp  (nuclear stress test 03-11-2014 low risk w/ no ischemia, ef 65%   Normocytic anemia    followed by pcp   (03-18-2020  had transfusion's 02/ 2021)   Osteomyelitis of great toe of right foot (HCC) 08/18/2021   Peripheral vascular disease (HCC)    Severe depression (HCC) 06/09/2021   Status post incision and drainage followed by Dr Samuella Cota and pcp   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   (03-17-2020  pt states most of incision is healed with exception an area that is still draining,  daily dressing change),  foot is red but not warm to the touch and has swelling but has  improved)   Suicidal ideation 06/09/2021   Syncope and collapse 06/07/2022   Syncope, near 06/20/2022   Thrombocytopenia (HCC)    chronic   Type 2 diabetes mellitus (HCC)    followed by pcp---    (03-18-2020  pt stated does not check blood sugar)   Vaccine counseling 08/29/2022   Past Surgical History:  Procedure Laterality Date   AMPUTATION Left 10/16/2019   Procedure: Left partial second ray resection; placement of antibiotic beads;  Surgeon: Park Liter, DPM;  Location: [facility];  Service: Podiatry;  Laterality: Left;   AMPUTATION Left 11/13/2019   Procedure: Left Midfoot Amputation - Transmetatarsal vs. Lisfranc; Placement antibiotic beads;  Surgeon: Park Liter, DPM;  Location: [facility];  Service: Podiatry;  Laterality: Left;   AMPUTATION Left 12/21/2019   Procedure: Chopart Amputation left foot;  Surgeon: Park Liter, DPM;  Location: [facility];  Service: Podiatry;  Laterality: Left;   AMPUTATION Left 06/24/2020   Procedure: LEFT BELOW KNEE AMPUTATION;  Surgeon: Nadara Mustard, MD;  Location: [facility] OR;  Service: Orthopedics;  Laterality: Left;   AMPUTATION Right 03/29/2021   Procedure: AMPUTATION RAY;  Surgeon: Park Liter, DPM;  Location: [facility];  Service: Podiatry;  Laterality: Right;   AMPUTATION Right 04/28/2023   Procedure: RIGHT PARTIAL FOOT AMPUTATION;  Surgeon: Felecia Shelling, DPM;  Location: [facility];  Service: Podiatry;  Laterality: Right;   AMPUTATION Right 06/03/2023   Procedure: RIGHT BELOW KNEE AMPUTATION;  Surgeon: Nadara Mustard, MD;  Location: [facility] OR;  Service: Orthopedics;  Laterality: Right;   AMPUTATION TOE Left 08/14/2019   Procedure: AMPUTATION GREAT TOE;  Surgeon: Park Liter, DPM;  Location: [facility];  Service: Podiatry;  Laterality: Left;   APPLICATION OF WOUND VAC Left 12/21/2019   Procedure: Application Of Wound Vac;  Surgeon: Park Liter, DPM;  [facility]: [facility];  Service: Podiatry;  Laterality: Left;   APPLICATION OF WOUND VAC Right 06/03/2023   Procedure:  APPLICATION OF WOUND VAC;  Surgeon: Nadara Mustard, MD;  [facility]: [facility];  Service: Orthopedics;  Laterality: Right;   BIOPSY  02/13/2018   Procedure: BIOPSY;  Surgeon: West Bali, MD;  [facility];  Service: Endoscopy;;  transverse colon biopsy, gastric biopsy   BONE BIOPSY Right 04/28/2023   Procedure: BONE BIOPSY;  Surgeon: Felecia Shelling, DPM;  [facility]: [facility];  Service: Podiatry;  Laterality: Right;   COLONOSCOPY WITH PROPOFOL N/A 02/13/2018   Procedure: COLONOSCOPY WITH PROPOFOL;  Surgeon: West Bali, MD;  [facility]: [facility];  Service: Endoscopy;  Laterality: N/A;  11:15am   ESOPHAGOGASTRODUODENOSCOPY (EGD) WITH PROPOFOL N/A 02/13/2018   Procedure: ESOPHAGOGASTRODUODENOSCOPY (EGD) WITH PROPOFOL;  Surgeon: West Bali, MD;  [facility];  Service: Endoscopy;  Laterality: N/A;   I & D EXTREMITY Left 07/16/2019   Procedure: Insicion  AND DEBRIDEMENT EXTREMITY;  Surgeon: Park Liter, DPM;  [facility]: [facility];  Service: Podiatry;  Laterality: Left;  I & D EXTREMITY Left 06/22/2020   Procedure: IRRIGATION AND DEBRIDEMENT EXTREMITY, left foot and ankle;  Surgeon: Park Liter, DPM;  [facility];  Service: Podiatry;  Laterality: Left;   I & D EXTREMITY Right 03/29/2021   Procedure: IRRIGATION AND DEBRIDEMENT EXTREMITY;  Surgeon: Park Liter, DPM;  [facility];  Service: Podiatry;  Laterality: Right;   INCISION AND DRAINAGE OF WOUND Left 10/13/2019   Procedure: IRRIGATION AND DEBRIDEMENT WOUND OF LEFT FOOT AND FIRST METATARSAL RESECTION;  Surgeon: Park Liter, DPM;  [facility]: [facility];  Service: Podiatry;  Laterality: Left;   IRRIGATION AND DEBRIDEMENT FOOT Left 11/11/2019   Procedure: Left Foot Wound Irrigation and Debridement;  Surgeon: Park Liter, DPM;  Location: [facility];  Service: Podiatry;  Laterality: Left;   Left arm     Left arm repair (tendon and artery)   PERCUTANEOUS PINNING  07/16/2019   Procedure: Open Reduction Percutaneous  Pinning Extremity;  Surgeon: Park Liter, DPM;  [facility];  Service: Podiatry;;   PILONIDAL CYST EXCISION N/A 08/08/2014   Procedure: CYST EXCISION PILONIDAL EXTENSIVE;  Surgeon: Dalia Heading, MD;  Location: [facility];  Service: General;  Laterality: N/A;   POLYPECTOMY  02/13/2018   Procedure: POLYPECTOMY;  Surgeon: West Bali, MD;  [facility];  Service: Endoscopy;;  transverse colon polyp hs, rectal polyps times 2   SAVORY DILATION N/A 02/13/2018   Procedure: SAVORY DILATION;  Surgeon: West Bali, MD;  [facility];  Service: Endoscopy;  Laterality: N/A;   TENDON LENGTHENING Right 12/04/2021   Procedure: TENDON LENGTHENING;  Surgeon: Asencion Islam, DPM;  [facility];  Service: Podiatry;  Laterality: Right;   TRANSMETATARSAL AMPUTATION Right 12/04/2021   Procedure: TRANSMETATARSAL AMPUTATION;  Surgeon: Asencion Islam, DPM;  [facility];  Service: Podiatry;  Laterality: Right;   WOUND DEBRIDEMENT Left 09/04/2019   Procedure: DEBRIDEMENT WOUND WITH COMPLEX  REPAIR OF DEHISCENCE;  Surgeon: Park Liter, DPM;  Location: [facility];  Service: Podiatry;  Laterality: Left;  Leave patient on stretcher   WOUND DEBRIDEMENT Left 10/16/2019   Procedure: Left foot wound debridement and closure;  Surgeon: Park Liter, DPM;  Location: [facility];  Service: Podiatry;  Laterality: Left;   WOUND DEBRIDEMENT Left 12/18/2019   Procedure: LEFT FOOT DEBRIDEMENT WITH PARTIAL INCISION OF INFECTED BONE;  Surgeon: Park Liter, DPM;  [facility];  Service: Podiatry;  Laterality: Left;  LEFT FOOT DEBRIDEMENT WITH PARTIAL INCISION OF INFECTED BONE   WOUND DEBRIDEMENT Right 04/01/2021   Procedure: Right foot wound debridement and irrigation and closure, bone biopsy;  Surgeon: Park Liter, DPM;  [facility]: [facility];  Service: Podiatry;  Laterality: Right;   Family History  Problem Relation Age of Onset   Heart attack Mother        Living, 65    Cancer Father        Deceased   Healthy Brother    Healthy Sister    Colon cancer Neg Hx    Colon polyps Neg Hx    Social History:  reports that he has been smoking cigars. He has never used smokeless tobacco. He reports current alcohol use of about 50.0 standard drinks of alcohol per week. He reports that he does not currently use drugs after having used the following drugs: Marijuana. Allergies: No Known Allergies Medications Prior to Admission  Medication Sig Dispense Refill   acetaminophen (TYLENOL) 325 MG tablet Take  2 tablets (650 mg total) by mouth every 6 (six) hours as needed for mild pain.     albuterol (VENTOLIN HFA) 108 (90 Base) MCG/ACT inhaler Inhale 1-2 puffs into the lungs every 4 (four) hours as needed for wheezing or shortness of breath.     ALPRAZolam (XANAX) 1 MG tablet Take 1 mg by mouth 4 (four) times daily.     amitriptyline (ELAVIL) 100 MG tablet Take 100 mg by mouth at bedtime.     amLODipine (NORVASC) 10 MG tablet Take 1 tablet (10 mg total) by mouth daily. 90 tablet 3   diclofenac Sodium (VOLTAREN) 1 % GEL Apply 4 g topically 4 (four) times daily as needed (Apply to painful area over left upper front chest.). 50 g 0   folic acid (FOLVITE) 1 MG tablet Take 1 tablet (1 mg total) by mouth daily. 30 tablet 0   metFORMIN (GLUCOPHAGE) 500 MG tablet Take 500 mg by mouth 2 (two) times daily.     morphine (MS CONTIN) 30 MG 12 hr tablet Take 30 mg by mouth every 12 (twelve) hours.     Multiple Vitamin (MULTIVITAMIN WITH MINERALS) TABS tablet Take 1 tablet by mouth daily.     oxycodone (ROXICODONE) 30 MG immediate release tablet Take 30 mg by mouth 3 (three) times daily.     rosuvastatin (CRESTOR) 20 MG tablet Take 1 tablet (20 mg total) by mouth daily. 90 tablet 3   thiamine 100 MG tablet Take 1 tablet (100 mg total) by mouth daily. 30 tablet 0   [DISCONTINUED] senna-docusate (SENOKOT-S) 8.6-50 MG tablet Take 1 tablet by mouth at bedtime as needed for mild constipation.      amoxicillin-clavulanate (AUGMENTIN) 875-125 MG tablet Take 1 tablet by mouth every 12 (twelve) hours. (Patient not taking: Reported on 05/29/2023) 28 tablet 0   doxycycline (VIBRA-TABS) 100 MG tablet Take 1 tablet (100 mg total) by mouth every 12 (twelve) hours. (Patient not taking: Reported on 05/29/2023) 14 tablet 0      Home: Home Living Family/patient expects to be discharged to:: Private residence Living Arrangements: Alone Available Help at Discharge: Family, Available PRN/intermittently Type of Home: Mobile home Home Access: Ramped entrance Home Layout: One level Bathroom Shower/Tub: Engineer, manufacturing systems: Standard Bathroom Accessibility: Yes Home Equipment: Agricultural consultant (2 wheels), Crutches, BSC/3in1, Wheelchair - manual, [employer] - single point, Other (comment), Shower seat Additional Comments: may go to daughter's house, has 4-5 steps to enter, one level, tub shower   Functional History: Prior Function Prior Level of Function : Independent/Modified Independent Mobility Comments: working on power WC, using crutches ADLs Comments: independent, not driving, reports not eating much  Functional Status:  Mobility: Bed Mobility Overal bed mobility: Modified Independent Bed Mobility: Supine to Sit, Sit to Supine Supine to sit: Supervision, HOB elevated, Used rails Sit to supine: Supervision, HOB elevated, Used rails General bed mobility comments: mod I Transfers Overall transfer level: Needs assistance Equipment used: Rolling walker (2 wheels) Transfers: Sit to/from Stand, Bed to chair/wheelchair/BSC Sit to Stand: Min assist, +2 safety/equipment, From elevated surface Bed to/from chair/wheelchair/BSC transfer type:: Stand pivot Stand pivot transfers: Mod assist  Lateral/Scoot Transfers: Min assist General transfer comment: Pt able to perform STS with min A from elevated surface, transferred to recliner with use of RW, mod A x2 due to unsteadiness and safety, needs  further training on t/f to recliner      ADL: ADL Overall ADL's : Needs assistance/impaired Eating/Feeding: Set up, Sitting Grooming: Set up, Sitting Upper  Body Bathing: Set up, Bed level Lower Body Bathing: Set up, Sitting/lateral leans, Bed level Upper Body Dressing : Set up, Sitting Lower Body Dressing: Maximal assistance, Sit to/from stand, Sitting/lateral leans Toilet Transfer: Moderate assistance, +2 for safety/equipment, BSC/3in1, Rolling walker (2 wheels) Toileting- Clothing Manipulation and Hygiene: Maximal assistance, Sitting/lateral lean, Sit to/from stand Toileting - Clothing Manipulation Details (indicate cue type and reason): for peri care Functional mobility during ADLs: Maximal assistance, +2 for physical assistance, +2 for safety/equipment, Cueing for safety, Cueing for sequencing, Rolling walker (2 wheels) General ADL Comments: Pt able to perform bathing prior to session in bed with set up. Pt completed UB/LB dressing with set up. Pt able to perform transfer to recliner with mod A, increased time using RW.  Cognition: Cognition Overall Cognitive Status: Within Functional Limits for tasks assessed Orientation Level: Oriented X4 Cognition Arousal: Alert Behavior During Therapy: WFL for tasks assessed/performed Overall Cognitive Status: Within Functional Limits for tasks assessed General Comments: pt with poor safety awareness but anticipate this is baseline personality.  Physical Exam: Blood pressure 137/79, pulse 74, temperature 98.2 F (36.8 C), temperature source Oral, resp. rate 17, height 6' 2.02" (1.88 m), weight 108.6 kg, SpO2 97%.  General: Alert and oriented x 3, No apparent distress HEENT: Head is normocephalic, atraumatic, PERRLA, EOMI, sclera anicteric, oral mucosa pink and moist, dentition decreased Heart: Reg rate and rhythm. No murmurs rubs or gallops Chest: CTA bilaterally without wheezes, rales, or rhonchi; no distress Abdomen: Soft, non-tender,  non-distended, bowel sounds positive. Extremities: Left BKA- wearing BKA- loose fit at socket Right BKA- Prevena removed. Incision well approximated. Dry dressing, shrinker sock and limb protector placed.  Psych: Pt's affect is appropriate. Pt is cooperative, very pleasant Skin: Clean and intact without signs of breakdown Neuro: Follows commands, cranial nerves II through XII grossly intact, normal speech and language Strength 5 out of 5 in bilateral upper extremities Strength 5 out of 5 in left hip flexion knee extension Able to lift right lower extremity to gravity Sensory exam normal for light touch and pain in all 4 limbs. No limb ataxia or cerebellar signs. No abnormal tone appreciated.  Musculoskeletal: No abnormal tone noted.  No joint swelling noted     Results for orders placed or performed during the hospital encounter of 05/29/23 (from the past 48 hour(s))  Glucose, capillary     Status: Abnormal   Collection Time: 06/07/23 12:31 PM  Result Value Ref Range   Glucose-Capillary 143 (H) 70 - 99 mg/dL    Comment: Glucose reference range applies only to samples taken after fasting for at least 8 hours.   Comment 1 Notify RN   Glucose, capillary     Status: Abnormal   Collection Time: 06/07/23  5:06 PM  Result Value Ref Range   Glucose-Capillary 260 (H) 70 - 99 mg/dL    Comment: Glucose reference range applies only to samples taken after fasting for at least 8 hours.   Comment 1 Notify RN   Glucose, capillary     Status: Abnormal   Collection Time: 06/07/23  8:33 PM  Result Value Ref Range   Glucose-Capillary 247 (H) 70 - 99 mg/dL    Comment: Glucose reference range applies only to samples taken after fasting for at least 8 hours.  Glucose, capillary     Status: Abnormal   Collection Time: 06/08/23 12:11 AM  Result Value Ref Range   Glucose-Capillary 206 (H) 70 - 99 mg/dL    Comment: Glucose reference range applies  only to samples taken after fasting for at least 8 hours.   Glucose, capillary     Status: Abnormal   Collection Time: 06/08/23  4:08 AM  Result Value Ref Range   Glucose-Capillary 121 (H) 70 - 99 mg/dL    Comment: Glucose reference range applies only to samples taken after fasting for at least 8 hours.  Glucose, capillary     Status: Abnormal   Collection Time: 06/08/23  8:11 AM  Result Value Ref Range   Glucose-Capillary 209 (H) 70 - 99 mg/dL    Comment: Glucose reference range applies only to samples taken after fasting for at least 8 hours.  Glucose, capillary     Status: Abnormal   Collection Time: 06/08/23 12:05 PM  Result Value Ref Range   Glucose-Capillary 107 (H) 70 - 99 mg/dL    Comment: Glucose reference range applies only to samples taken after fasting for at least 8 hours.  Glucose, capillary     Status: Abnormal   Collection Time: 06/08/23  4:10 PM  Result Value Ref Range   Glucose-Capillary 200 (H) 70 - 99 mg/dL    Comment: Glucose reference range applies only to samples taken after fasting for at least 8 hours.  Glucose, capillary     Status: Abnormal   Collection Time: 06/08/23  8:26 PM  Result Value Ref Range   Glucose-Capillary 208 (H) 70 - 99 mg/dL    Comment: Glucose reference range applies only to samples taken after fasting for at least 8 hours.  Glucose, capillary     Status: Abnormal   Collection Time: 06/09/23 12:28 AM  Result Value Ref Range   Glucose-Capillary 101 (H) 70 - 99 mg/dL    Comment: Glucose reference range applies only to samples taken after fasting for at least 8 hours.  Glucose, capillary     Status: Abnormal   Collection Time: 06/09/23  5:15 AM  Result Value Ref Range   Glucose-Capillary 121 (H) 70 - 99 mg/dL    Comment: Glucose reference range applies only to samples taken after fasting for at least 8 hours.  Glucose, capillary     Status: Abnormal   Collection Time: 06/09/23  8:22 AM  Result Value Ref Range   Glucose-Capillary 168 (H) 70 - 99 mg/dL    Comment: Glucose reference range applies  only to samples taken after fasting for at least 8 hours.  Glucose, capillary     Status: Abnormal   Collection Time: 06/09/23 11:59 AM  Result Value Ref Range   Glucose-Capillary 112 (H) 70 - 99 mg/dL    Comment: Glucose reference range applies only to samples taken after fasting for at least 8 hours.   No results found.    Blood pressure 137/79, pulse 74, temperature 98.2 F (36.8 C), temperature source Oral, resp. rate 17, height 6' 2.02" (1.88 m), weight 108.6 kg, SpO2 97%.  Medical Problem List and Plan: 1. 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  Antithrombotics: -DVT/anticoagulation:  Pharmaceutical: Lovenox  -antiplatelet therapy: none  3. Pain Management: history of chronic pain (morphine sulfate ER 30 mg tabs #60 filled 05/11/2023, oxycodone 30 mg #120 filled 05/10/2023 Dr. Elfredia Nevins -  Tylenol as needed  -continue MS contin 15 mg BID  -Will start gabapentin 100mg  TID for phantom pain   -Amitriptyline on hold due to use of linezolid   4. Mood/Behavior/Sleep: History of anxiety and depression; LCSW to evaluate and provide emotional support  -stop amitriptyline today as starting linezolid -continue Xanax as needed (Xanax 1 mg tabs #120 filled 05/10/2023)  -antipsychotic agents: n/a  5. Neuropsych/cognition: This patient is capable of making decisions on his own behalf.  6. Skin/Wound Care: Routine skin care checks  - Prevena wound VAC was removed 8/9 monitor incision   7. Fluids/Electrolytes/Nutrition: Routine Is and Os and follow-up chemistries  -continue vitamin C, MVI, Juven, zinc, Ensure  8: Hypertension: monitor TID and prn  -continue amlodipine 10  mg -Controlled  9: Hyperlipidemia: continue statin  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: CBGs QID; A1c = 5.6 on 04/27/2023 (home metformin 500 mg BID)  -continue SSI  11: GERD: continue Protonix  12: Left foot osteomyelitis: Per ID>>"Continue Vancomycin and Unasyn inpatient  (will discontinue at CIR admission) On amitryptyline 100mg  po HS, low risk for serotonin syndrome, can consider holding while on linezolid>>done  "Can switch to Linezolid 600mg  po bid through 8/15 ( strep mitis/oralis)  and amoxicillin 1 g po tid ( proteus and enterococcus) through 8/10 for treatment of secondary polymicrobial bacteremia when discharged"  13: Hyponatremia; mild: follow-up BMP  14. CAD in native artery. Denies CP     Milinda Antis, PA-C 06/09/2023  I have personally performed a face to face diagnostic evaluation of this patient and formulated the key components of the plan.  Additionally, I have personally reviewed laboratory data, imaging studies, as well as relevant notes and concur with the physician assistant's documentation above.  The patient's status has not changed from the original H&P.  Any changes in documentation from the acute care chart have been noted above.  Fanny Dance, MD, [place] [facility]

## 2023-06-09 NOTE — Progress Notes (Signed)
Pharmacy Antibiotic Note  Eugene Diaz is a 57 y.o. male admitted on 05/29/2023 with E.faecalis / P.mirabilis / S.mitis/oralis bacteremia and osteomyelitis.  Pharmacy has been consulted for vancomycin dosing.  Vanc peak 32, trough 11 >> AUC 495, at goal.  Plan: This patient's current antibiotics will be continued without adjustments. EOT per ID is 8/15 for Linezolid/Vancomycin and 8/10 for Amoxil/Unasyn.  IV while inpatient and plan for PO prior to D/C.   Height: 6' 2.02" (188 cm) Weight: 108.6 kg (239 lb 6.7 oz) IBW/kg (Calculated) : 82.24  Temp (24hrs), Avg:98.1 F (36.7 C), Min:98 F (36.7 C), Max:98.2 F (36.8 C)  Recent Labs  Lab 06/04/23 0757 06/05/23 2132 06/05/23 2332  CREATININE 0.82  --   --   VANCOTROUGH  --  11*  --   VANCOPEAK  --   --  32    Estimated Creatinine Clearance: 128.9 mL/min (by C-G formula based on SCr of 0.82 mg/dL).    No Known Allergies  Antimicrobials this admission: Rocephin 7/29 >>7/31 Flagyl 7/29 >>7/31 Unasyn 7/31>> Vanc 7/29 >>7/31, 8/2>>  Microbiology results: 7/29 Bcx 1/2: Enterococcus, Proteus (Both Amp-S) 7/29 Bcx 1/2 S. Oralis (S- only vanc) 7/31 Bcx NGTD x 5d   Thank you for allowing pharmacy to be a part of this patient's care.  Estill Batten, PharmD, BCCCP  06/09/2023 8:38 AM

## 2023-06-09 NOTE — Progress Notes (Signed)
Inpatient Rehabilitation Admission Medication Review by a Pharmacist  A complete drug regimen review was completed for this patient to identify any potential clinically significant medication issues.  High Risk Drug Classes Is patient taking? Indication by Medication  Antipsychotic No   Anticoagulant Yes Lovenox - DVT px  Antibiotic Yes Amoxicillin until 8/10/Linezolid until 8/15 - bacteremia/osteo  Opioid Yes Oxycodone/morphine - pain  Antiplatelet No   Hypoglycemics/insulin No   Vasoactive Medication Yes Amlodipine - HTN  Chemotherapy No   Other Yes Robaxin - spasms Vitamin C/zinc - wounds Protonix - GERD Xanax - anxiety Crestor - HLD     Type of Medication Issue Identified Description of Issue Recommendation(s)  Drug Interaction(s) (clinically significant)     Duplicate Therapy     Allergy     No Medication Administration End Date     Incorrect Dose     Additional Drug Therapy Needed     Significant med changes from prior encounter (inform family/care partners about these prior to discharge).    Other  Folic acid, metformin, thiamine, Voltaren gel Resume in CIR or at discharge if needed    Clinically significant medication issues were identified that warrant physician communication and completion of prescribed/recommended actions by midnight of the next day:  No  Name of provider notified for urgent issues identified:   Provider Method of Notification:     Pharmacist comments:   Time spent performing this drug regimen review (minutes):  20  Ulyses Southward, PharmD, BCIDP, AAHIVP, CPP Infectious Disease Pharmacist

## 2023-06-10 ENCOUNTER — Other Ambulatory Visit: Payer: Self-pay

## 2023-06-10 DIAGNOSIS — R739 Hyperglycemia, unspecified: Secondary | ICD-10-CM

## 2023-06-10 LAB — GLUCOSE, CAPILLARY
Glucose-Capillary: 120 mg/dL — ABNORMAL HIGH (ref 70–99)
Glucose-Capillary: 107 mg/dL — ABNORMAL HIGH (ref 70–99)
Glucose-Capillary: 154 mg/dL — ABNORMAL HIGH (ref 70–99)
Glucose-Capillary: 201 mg/dL — ABNORMAL HIGH (ref 70–99)

## 2023-06-10 MED ORDER — MELATONIN 5 MG PO TABS
5.0000 mg | ORAL_TABLET | Freq: Every day | ORAL | Status: DC
  Administered 2023-06-10 – 2023-06-15 (×6): 5 mg via ORAL
  Filled 2023-06-10 (×6): qty 1

## 2023-06-10 NOTE — Progress Notes (Signed)
Occupational Therapy Session Note  Patient Details  Name: Eugene Diaz MRN: [id number] Date of Birth: [birth year]  Today's Date: 06/10/2023 OT Individual Time: 0100-0215 OT Individual Time Calculation (min): 75 min    Short Term Goals: Week 1:  OT Short Term Goal 1 (Week 1): Short term goals equal  LTG's  Skilled Therapeutic Interventions/Progress Updates:  Self Care/advanced ADL retraining;Therapeutic Exercise;UE/LE Coordination activities;UE/LE Strength taining/ROM;Balance/vestibular training;DME/adaptive equipment instruction   Patient seen this day for skilled OT, pt seated at w/c LOF at the time of arrival. Patient able to roll himself to the gym with close S. Patient able to complete sit to stands incorporating the bars of the walking frame 5x with rest breaks as needed, the pt required 2 rest breaks. The pt went on to complete UB exercise using a 3lb dumb bell for  bicep curls, shld flexion, horizontal abduction, and elbow extension 2 sets of 10 with rest breaks as needed, the pt required 3 rest breaks.  The pt was able to roll himself greater than 200 ft and then enter the transitional living quarters to practice tub/shower transfers with emphasis on safety using the grab bars for additional balance.  The pt was able to transfer from the w/c to the tub bench using the grab bars with CGA to close S.  At the end of treatment, the pt was able to transport himself back to his room using the w/c with close S.  The pt remained at w/c LOF with his footrest in place, his call light and bedside table were both within reach and all additional needs were addressed.   Therapy Documentation Precautions:  Precautions Precautions: Fall Precaution Comments: chronic L BKA, has prosthetic, new R BKA Restrictions Weight Bearing Restrictions: Yes RLE Weight Bearing: Non weight bearing Other Position/Activity Restrictions: RLE limb protector General: General Chart Reviewed: Yes Family/Caregiver  Present: No Vital Signs: Therapy Vitals Temp: 98.6 F (37 C) Pulse Rate: 77 Resp: 18 BP: 122/76 Patient Position (if appropriate): Sitting Oxygen Therapy SpO2: 97 % O2 Device: Room Air Patient Activity (if Appropriate): In bed Pain: Pain Assessment Pain Score: 8  (R leg) Pain Type: Surgical pain Pain Location: Leg Pain Orientation: Right Pain Onset: On-going ADL: ADL Equipment Provided:  (w/c) Eating: Set up Where Assessed-Eating: Edge of bed Grooming: Setup Where Assessed-Grooming: Edge of bed Upper Body Bathing: Setup Where Assessed-Upper Body Bathing: Edge of bed Lower Body Bathing: Setup (using grab bars for additional balance) Where Assessed-Lower Body Bathing: Edge of bed Upper Body Dressing: Setup Where Assessed-Upper Body Dressing: Edge of bed Lower Body Dressing: Setup Where Assessed-Lower Body Dressing: Edge of bed Toileting: Minimal assistance, Moderate assistance Where Assessed-Toileting: Other (Comment) (based on functional status during bathing of LB) Toilet Transfer: Contact guard, Minimal assistance (based on functional transfer from bed LOF to w/c) Toilet Transfer Method: Stand pivot Acupuncturist: Gaffer: Modified independent Tub/Shower Transfer Method: Stand pivot (based on transfer from bed LOF to w/c) Tub/Shower Equipment: Shower seat with back (patient indicated that he has a shower chair with a back and no grab bars.) Astronomer: Information systems manager with back Vision Baseline Vision/History: 1 Wears glasses (readers) Patient Visual Report: No change from baseline Vision Assessment?: No apparent visual deficits Perception  Perception: Within Functional Limits Praxis Praxis: [facility] Balance Balance Balance Assessed:  (Patient able to sit upright unsupported  Simultaneous filing. User may not have seen previous data.) Static Sitting Balance Static Sitting - Balance Support: Feet supported Static  Sitting -  Level of Assistance: 6: Modified independent (Device/Increase time) Dynamic Sitting Balance Dynamic Sitting - Balance Support: During functional activity Dynamic Sitting - Level of Assistance: 6: Modified independent (Device/Increase time) Sitting balance - Comments: Pt able to lean L/R as needed, able to don/doff RLE limb protector, and L prosthetic with set up Static Standing Balance Static Standing - Balance Support: Bilateral upper extremity supported Static Standing - Level of Assistance: 5: Stand by assistance Dynamic Standing Balance Dynamic Standing - Balance Support: During functional activity;Bilateral upper extremity supported Dynamic Standing - Level of Assistance: 4: Min assist Exercises:   Other Treatments:     Therapy/Group: Individual Therapy  Lavona Mound 06/10/2023, 3:05 PM

## 2023-06-10 NOTE — Evaluation (Signed)
Occupational Therapy Assessment and Plan  Patient Details  Name: Eugene Diaz MRN: [id number] Date of Birth: September 15, 1966  OT Diagnosis: paraplegia at level AKA Rehab Potential: Rehab Potential (ACUTE ONLY): Good ELOS: 7-10   Today's Date: 06/10/2023 OT Individual Time: 0800-0900 OT Individual Time Calculation (min): 60 min     Hospital Problem: Principal Problem:   S/P BKA (below knee amputation), right (HCC)   Past Medical History:  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) 05/23/2022   Depression    Diabetic neuropathy (HCC)    Edema of both lower extremities    Essential tremor    neurologist-- dr patel--- due to alcohol abuse   GERD (gastroesophageal reflux disease)    History of cellulitis 2020   left lower leg;   recurrent 03-25-2020   History of esophageal stricture    s/p  dilatation 02-13-2018   History of osteomyelitis 2020   left great toe     Hypercholesterolemia    Hypertension    followed by pcp  (nuclear stress test 03-11-2014 low risk w/ no ischemia, ef 65%   Normocytic anemia    followed by pcp   (03-18-2020 had transfusion's 02/ 2021)   Osteomyelitis of great toe of right foot (HCC) 08/18/2021   Peripheral vascular disease (HCC)    Severe depression (HCC) 06/09/2021   Status post incision and drainage followed by Dr Samuella Cota and pcp   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   (03-17-2020  pt states most of incision is healed with exception an area that is still draining,  daily dressing change),  foot is red but not warm to the touch and has swelling but has improved)   Suicidal ideation 06/09/2021   Syncope and collapse 06/07/2022   Syncope, near 06/20/2022   Thrombocytopenia (HCC)    chronic   Type 2 diabetes mellitus (HCC)    followed  by pcp---    (03-18-2020  pt stated does not check blood sugar)   Vaccine counseling 08/29/2022   Past Surgical History:  Past Surgical History:  Procedure Laterality Date   AMPUTATION Left 10/16/2019   Procedure: Left partial second ray resection; placement of antibiotic beads;  Surgeon: Park Liter, DPM;  Location: [facility];  Service: Podiatry;  Laterality: Left;   AMPUTATION Left 11/13/2019   Procedure: Left Midfoot Amputation - Transmetatarsal vs. Lisfranc; Placement antibiotic beads;  Surgeon: Park Liter, DPM;  Location: [facility];  Service: Podiatry;  Laterality: Left;   AMPUTATION Left 12/21/2019   Procedure: Chopart Amputation left foot;  Surgeon: Park Liter, DPM;  Location: [facility];  Service: Podiatry;  Laterality: Left;   AMPUTATION Left 06/24/2020   Procedure: LEFT BELOW KNEE AMPUTATION;  Surgeon: Nadara Mustard, MD;  Location: [facility];  Service: Orthopedics;  Laterality: Left;   AMPUTATION Right 03/29/2021   Procedure: AMPUTATION RAY;  Surgeon: Park Liter, DPM;  [facility]: [facility];  Service: Podiatry;  Laterality: Right;   AMPUTATION Right 04/28/2023   Procedure: RIGHT PARTIAL FOOT AMPUTATION;  Surgeon: Felecia Shelling, DPM;  [facility]: [facility];  Service: Podiatry;  Laterality: Right;   AMPUTATION Right 06/03/2023   Procedure: RIGHT BELOW KNEE AMPUTATION;  Surgeon: Nadara Mustard, MD;  [facility]: [facility];  Service: Orthopedics;  Laterality: Right;   AMPUTATION TOE Left 08/14/2019   Procedure: AMPUTATION GREAT TOE;  Surgeon: Park Liter, DPM;  Location: [facility];  Service: Podiatry;  Laterality: Left;   APPLICATION OF WOUND VAC Left 12/21/2019   Procedure: Application Of Wound Vac;  Surgeon: Park Liter, DPM;  [facility];  Service: Podiatry;  Laterality: Left;   APPLICATION OF WOUND VAC Right 06/03/2023   Procedure: APPLICATION OF WOUND VAC;  Surgeon: Nadara Mustard, MD;  [facility]: [facility];  Service: Orthopedics;  Laterality: Right;   BIOPSY  02/13/2018   Procedure: BIOPSY;   Surgeon: West Bali, MD;  [facility]: [facility];  Service: Endoscopy;;  transverse colon biopsy, gastric biopsy   BONE BIOPSY Right 04/28/2023   Procedure: BONE BIOPSY;  Surgeon: Felecia Shelling, DPM;  [facility];  Service: Podiatry;  Laterality: Right;   COLONOSCOPY WITH PROPOFOL N/A 02/13/2018   Procedure: COLONOSCOPY WITH PROPOFOL;  Surgeon: West Bali, MD;  [facility]: [facility];  Service: Endoscopy;  Laterality: N/A;  11:15am   ESOPHAGOGASTRODUODENOSCOPY (EGD) WITH PROPOFOL N/A 02/13/2018   Procedure: ESOPHAGOGASTRODUODENOSCOPY (EGD) WITH PROPOFOL;  Surgeon: West Bali, MD;  [facility];  Service: Endoscopy;  Laterality: N/A;   I & D EXTREMITY Left 07/16/2019   Procedure: Insicion  AND DEBRIDEMENT EXTREMITY;  Surgeon: Park Liter, DPM;  [facility];  Service: Podiatry;  Laterality: Left;   I & D EXTREMITY Left 06/22/2020   Procedure: IRRIGATION AND DEBRIDEMENT EXTREMITY, left foot and ankle;  Surgeon: Park Liter, DPM;  [facility];  Service: Podiatry;  Laterality: Left;   I & D EXTREMITY Right 03/29/2021   Procedure: IRRIGATION AND DEBRIDEMENT EXTREMITY;  Surgeon: Park Liter, DPM;  [facility];  Service: Podiatry;  Laterality: Right;   INCISION AND DRAINAGE OF WOUND Left 10/13/2019   Procedure: IRRIGATION AND DEBRIDEMENT WOUND OF LEFT FOOT AND FIRST METATARSAL RESECTION;  Surgeon: Park Liter, DPM;  Location: [facility];  Service: Podiatry;  Laterality: Left;   IRRIGATION AND DEBRIDEMENT FOOT Left 11/11/2019   Procedure: Left Foot Wound Irrigation and Debridement;  Surgeon: Park Liter, DPM;  Location: [facility];  Service: Podiatry;  Laterality: Left;   Left arm     Left arm repair (tendon and artery)   PERCUTANEOUS PINNING  07/16/2019   Procedure: Open Reduction Percutaneous Pinning Extremity;  Surgeon: Park Liter, DPM;  [facility];  Service: Podiatry;;   PILONIDAL CYST EXCISION N/A 08/08/2014   Procedure: CYST EXCISION  PILONIDAL EXTENSIVE;  Surgeon: Dalia Heading, MD;  Location: [facility];  Service: General;  Laterality: N/A;   POLYPECTOMY  02/13/2018   Procedure: POLYPECTOMY;  Surgeon: West Bali, MD;  [facility];  Service: Endoscopy;;  transverse colon polyp hs, rectal polyps times 2   SAVORY DILATION N/A 02/13/2018   Procedure: SAVORY DILATION;  Surgeon: West Bali, MD;  [facility];  Service: Endoscopy;  Laterality: N/A;   TENDON LENGTHENING Right 12/04/2021   Procedure: TENDON LENGTHENING;  Surgeon: Asencion Islam, DPM;  [facility];  Service: Podiatry;  Laterality: Right;   TRANSMETATARSAL AMPUTATION Right 12/04/2021   Procedure: TRANSMETATARSAL AMPUTATION;  Surgeon: Asencion Islam, DPM;  [facility];  Service: Podiatry;  Laterality: Right;   WOUND DEBRIDEMENT Left 09/04/2019   Procedure: DEBRIDEMENT WOUND WITH COMPLEX  REPAIR OF DEHISCENCE;  Surgeon: Park Liter, DPM;  Location: [facility];  Service: Podiatry;  Laterality: Left;  Leave patient on stretcher   WOUND DEBRIDEMENT Left 10/16/2019   Procedure: Left foot wound debridement and closure;  Surgeon: Park Liter, DPM;  Location: [facility];  Service: Podiatry;  Laterality: Left;   WOUND DEBRIDEMENT Left 12/18/2019   Procedure: LEFT FOOT DEBRIDEMENT WITH PARTIAL INCISION OF INFECTED BONE;  Surgeon: Park Liter, DPM;  [facility];  Service: Podiatry;  Laterality: Left;  LEFT FOOT DEBRIDEMENT WITH PARTIAL INCISION OF INFECTED BONE   WOUND DEBRIDEMENT Right 04/01/2021   Procedure: Right foot wound debridement and irrigation and closure, bone biopsy;  Surgeon: Park Liter, DPM;  [facility]: [facility];  Service: Podiatry;  Laterality: Right;    Assessment & Plan Clinical Impression: CC: Functional deficits secondary to right BKA   HPI: Saied Boldt is a 57 year old male who presented to the ED on 05/29/2023. He is status post right tendo Achilles lengthening with Chopart amputation of the  right foot by Dr. Logan Bores on 04/28/2023 on Augmentin and followed by ID as outpatient. At follow-up on 7/24, there appeared to be complete breakdown of the amputation stump to the right lower extremity. He was instructed to proceed to the ED for further management and consultation to Dr. Lajoyce Corners. His history is significant for DM s/p left BKA in 2021. Plain radiographs of the right lower extremity demonstrated diffuse soft tissue swelling throughout the lower leg and foot, as well as findings concerning for osteomyelitis involving the calcaneus. Multiple antibiotics started and blood cultures obtained. ID consulted. TTE ordered. Podiatry consulted. Per Dr. Ralene Cork, at this time patient would be best served with below knee amputation as infection has spread too far for salvage. Orthopedics consulted for osteomyelitits and abscess of the right foot. Dr. Lajoyce Corners recommended proceeding with right transtibial amputation and this was performed on 06/03/2023. ID recommendations include: Continue Vancomycin and Unasyn inpatient. On amitryptyline 100mg  po HS, low risk for serotonin syndrome, can consider holding while on linezolid. Can switch to Linezolid 600mg  po bid through 8/15 ( strep mitis/oralis)  and amoxicillin 1 g po tid ( proteus and enterococcus) through 8/10 for treatment of secondary polymicrobial bacteremia when discharged. Tolerating diet. OT/PT evals done. Pt reports b/l phantom pain and pain at surgical incision. PDMP review indicates he takes morphine sulfate ER 30 mg twice a day and oxycodone 30mg  PRN at home in additional to alprazolam.  The patient requires inpatient medicine and rehabilitation evaluations and services for ongoing dysfunction secondary to right BKA and prior left BKA.  Patient transferred to CIR on 06/09/2023 .    Patient currently requires min with basic self-care skills secondary to muscle weakness.  Prior to hospitalization, patient could complete BADL with modified independent .  Patient will  benefit from skilled intervention to decrease level of assist with basic self-care skills prior to discharge home with care partner.  Anticipate patient will require 24 hour supervision and no further OT follow recommended.  OT - End of Session Activity Tolerance: Tolerates 30+ min activity with multiple rests Endurance Deficit: No OT Assessment Rehab Potential (ACUTE ONLY): Good OT Patient demonstrates impairments in the following area(s): Balance OT Basic ADL's Functional Problem(s): Other (comment) (functional transfers during task performance) OT Transfers Functional Problem(s): Tub/Shower (patient has tub/shower  would benefit from practice getting in and out safely) OT Plan OT Frequency: 5 out of 7 days;Total of 15 hours over 7 days of combined therapies OT Duration/Estimated Length of Stay: 7-10 OT Treatment/Interventions:  Self Care/advanced ADL retraining;Therapeutic Exercise;UE/LE Coordination activities;UE/LE Strength taining/ROM;Balance/vestibular Forensic psychologist OT Self Feeding Anticipated Outcome(s): ModI OT Basic Self-Care Anticipated Outcome(s): ModI OT Toileting Anticipated Outcome(s): ModI OT Bathroom Transfers Anticipated Outcome(s): ModI OT Recommendation Patient destination: Home Follow Up Recommendations: None Equipment Recommended: 3 in 1 bedside comode Equipment Details: to be determined   OT Evaluation Precautions/Restrictions  Precautions Precautions: Fall Precaution Comments: chronic L BKA, has prosthetic, new R BKA Restrictions Weight Bearing Restrictions: Yes RLE Weight Bearing: Non weight bearing Other Position/Activity Restrictions: RLE limb protector General Chart Reviewed: Yes Family/Caregiver Present: No Vital Signs Therapy Vitals Temp: 98.6 F (37 C) Pulse Rate: 77 Resp: 18 BP: 122/76 Patient Position (if appropriate): Sitting Oxygen Therapy SpO2: 97 % O2 Device: Room Air Patient Activity (if Appropriate):  In bed Pain Pain Assessment Pain Score: 8  (R leg) Pain Type: Surgical pain Pain Location: Leg Pain Orientation: Right Pain Onset: On-going Home Living/Prior Functioning Home Living Family/patient expects to be discharged to:: Private residence Living Arrangements: Children Available Help at Discharge: Available 24 hours/day (discussed hiring a friend to assist during the day) Type of Home: Mobile home Home Access: Stairs to enter (patient indicated 1 stair 5-6 inches in height.  Patient daughters home may be an additional option, she has 5-6 stairs.) Entrance Stairs-Number of Steps: 4 Entrance Stairs-Rails: Can reach both Home Layout: One level Bathroom Shower/Tub: Engineer, manufacturing systems: Standard Additional Comments: may go to daughter's house, has 4-5 steps to enter, one level, tub shower  Lives With: Daughter IADL History Homemaking Responsibilities: Yes Meal Prep Responsibility: Primary (microwave meals) Current License: No Occupation: Retired Prior Function Level of Independence: Independent with transfers, Independent with gait, Requires assistive device for independence (RW)  Able to Take Stairs?: Yes Driving: No Vocation: On disability Vision Baseline Vision/History: 1 Wears glasses (readers) Ability to See in Adequate Light: 0 Adequate Patient Visual Report: No change from baseline Vision Assessment?: No apparent visual deficits Perception  Perception: Within Functional Limits Praxis Praxis: WFL Cognition Cognition Overall Cognitive Status: Within Functional Limits for tasks assessed Arousal/Alertness: Awake/alert Memory: Appears intact Awareness: Appears intact Problem Solving: Appears intact Behaviors: Other (comment) (very private in relation to bathing and dressing exercise) Safety/Judgment: Appears intact Brief Interview for Mental Status (BIMS) Repetition of Three Words (First Attempt): 2 Temporal Orientation: Year: Missed by 1  year Temporal Orientation: Month: Accurate within 5 days Temporal Orientation: Day: Correct Recall: "Sock": Yes, after cueing ("something to wear") Recall: "Blue": Yes, no cue required Recall: "Bed": No, could not recall BIMS Summary Score: 10 Sensation Sensation Light Touch: Appears Intact Hot/Cold: Not tested Proprioception: Not tested Stereognosis: Not tested Coordination Gross Motor Movements are Fluid and Coordinated: No Fine Motor Movements are Fluid and Coordinated: No Coordination and Movement Description: limited d/t change in COG Motor  Motor Motor: Within Functional Limits Motor - Skilled Clinical Observations: move from one surface to the next  Trunk/Postural Assessment  Cervical Assessment Cervical Assessment: Within Functional Limits Thoracic Assessment Thoracic Assessment: Within Functional Limits Lumbar Assessment Lumbar Assessment: Within Functional Limits Postural Control Postural Control: Within Functional Limits  Balance Balance Balance Assessed:  (Patient able to sit upright unsupported  Simultaneous filing. User may not have seen previous data.) Static Sitting Balance Static Sitting - Balance Support: Feet supported Static Sitting - Level of Assistance: 6: Modified independent (Device/Increase time) Dynamic Sitting Balance Dynamic Sitting - Balance Support: During functional activity Dynamic Sitting - Level of Assistance: 6: Modified independent (Device/Increase time) Sitting balance - Comments: Pt able  to lean L/R as needed, able to don/doff RLE limb protector, and L prosthetic with set up Static Standing Balance Static Standing - Balance Support: Bilateral upper extremity supported Static Standing - Level of Assistance: 5: Stand by assistance Dynamic Standing Balance Dynamic Standing - Balance Support: During functional activity;Bilateral upper extremity supported Dynamic Standing - Level of Assistance: 4: Min assist Extremity/Trunk  Assessment RUE Assessment RUE Assessment: Within Functional Limits Passive Range of Motion (PROM) Comments: WFL Active Range of Motion (AROM) Comments: WFL General Strength Comments: 4+/5MMT LUE Assessment LUE Assessment: Within Functional Limits Passive Range of Motion (PROM) Comments: WFL Active Range of Motion (AROM) Comments: WFL General Strength Comments: 4+/5MMT  Care Tool Care Tool Self Care Eating   Eating Assist Level: Set up assist    Oral Care    Oral Care Assist Level: Set up assist    Bathing   Body parts bathed by patient: Right arm;Left arm;Chest;Abdomen;Front perineal area;Buttocks;Right upper leg;Left upper leg          Upper Body Dressing(including orthotics)   What is the patient wearing?: Pull over shirt   Assist Level: Set up assist    Lower Body Dressing (excluding footwear)   What is the patient wearing?: Pants Assist for lower body dressing: Set up assist    Putting on/Taking off footwear   What is the patient wearing?: Orthosis Assist for footwear: Set up assist       Care Tool Toileting Toileting activity   Assist for toileting: Contact Guard/Touching assist     Care Tool Bed Mobility Roll left and right activity   Roll left and right assist level: Supervision/Verbal cueing    Sit to lying activity   Sit to lying assist level: Supervision/Verbal cueing    Lying to sitting on side of bed activity   Lying to sitting on side of bed assist level: the ability to move from lying on the back to sitting on the side of the bed with no back support.: Supervision/Verbal cueing     Care Tool Transfers Sit to stand transfer   Sit to stand assist level: Minimal Assistance - Patient > 75%    Chair/bed transfer   Chair/bed transfer assist level: Minimal Assistance - Patient > 75%     Toilet transfer         Care Tool Cognition  Expression of Ideas and Wants Expression of Ideas and Wants: 4. Without difficulty (complex and basic) -  expresses complex messages without difficulty and with speech that is clear and easy to understand  Understanding Verbal and Non-Verbal Content Understanding Verbal and Non-Verbal Content: 3. Usually understands - understands most conversations, but misses some part/intent of message. Requires cues at times to understand   Memory/Recall Ability Memory/Recall Ability : Current season;That he or she is in a hospital/hospital unit   Refer to Care Plan for Long Term Goals  SHORT TERM GOAL WEEK 1 OT Short Term Goal 1 (Week 1): Short term goals to be determined by LTG's  Recommendations for other services: None    Skilled Therapeutic Intervention   Patient seen this date for skilled OT assessment to determine needs in relation to everyday living skills. The pt was awake at the time of service delivery, in good spirits demonstrating a willingness to participate in the therapeutic process. The pt was able to come from supine in bed to EOB with SBA and was able to demonstrate good sitting balance for the performance of BADL related task in bathing EOB with  close S for UB/LB function  The pt was able to donn his UB clothing items, consisting of a over head shirt with s/u assist. The pt was SBA for LB task performance for donning his pants over his bottom. The pt was able to transfer from EOB to w/c LOF with CGA  using a stand pivot transfer while incorporating the arm of the w/c and the bed rails for additional balance. The pt  was able to maneuver a manual w/c with close S > 200 ft The pt currently presents with UB strength of 4+/5MMT for BUE and would benefit from skill OT to maximize his  UB strength for safe and Ind task performance.  He would also benefit from instruction in tub/shower transfers, and simple meal prep using the microwave over.  It is recommendation that the pt be seen for a duration of 7-10 days to maximize his rehab potential for a safe transition to home with consideration for AE as  needed.  ADL ADL Equipment Provided:  (w/c) Eating: Set up Where Assessed-Eating: Edge of bed Grooming: Setup Where Assessed-Grooming: Edge of bed Upper Body Bathing: Setup Where Assessed-Upper Body Bathing: Edge of bed Lower Body Bathing: Setup (using grab bars for additional balance) Where Assessed-Lower Body Bathing: Edge of bed Upper Body Dressing: Setup Where Assessed-Upper Body Dressing: Edge of bed Lower Body Dressing: Setup Where Assessed-Lower Body Dressing: Edge of bed Toileting: Minimal assistance;Moderate assistance Where Assessed-Toileting: Other (Comment) (based on functional status during bathing of LB) Toilet Transfer: Contact guard;Minimal assistance (based on functional transfer from bed LOF to w/c) Toilet Transfer Method: Stand pivot Toilet Transfer Equipment: Bedside commode Tub/Shower Transfer: Modified independent Tub/Shower Transfer Method: Stand pivot (based on transfer from bed LOF to w/c) Tub/Shower Equipment: Shower seat with back (patient indicated that he has a shower chair with a back and no grab bars.) Astronomer: Shower seat with back Mobility  Bed Mobility Bed Mobility: Supine to Sit;Sitting - Scoot to Edge of Bed (CGA) Supine to Sit: Contact Guard/Touching assist Transfers Sit to Stand: Minimal Assistance - Patient > 75%   Discharge Criteria: Patient will be discharged from OT if patient refuses treatment 3 consecutive times without medical reason, if treatment goals not met, if there is a change in medical status, if patient makes no progress towards goals or if patient is discharged from hospital.  The above assessment, treatment plan, treatment alternatives and goals were discussed and mutually agreed upon: by patient  Lavona Mound 06/10/2023, 3:00 PM

## 2023-06-10 NOTE — Evaluation (Signed)
Physical Therapy Assessment and Plan  Patient Details  Name: Eugene Diaz MRN: [id number] Date of Birth: [birth year]  PT Diagnosis: Difficulty walking, Impaired sensation, and Pain in BLE Rehab Potential: Excellent ELOS: 7-10 days   Today's Date: 06/10/2023 PT Individual Time: 1045-1200 PT Individual Time Calculation (min): 75 min    Hospital Problem: Principal Problem:   S/P BKA (below knee amputation), right (HCC)   Past Medical History:  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) 05/23/2022   Depression    Diabetic neuropathy (HCC)    Edema of both lower extremities    Essential tremor    neurologist-- dr patel--- due to alcohol abuse   GERD (gastroesophageal reflux disease)    History of cellulitis 2020   left lower leg;   recurrent 03-25-2020   History of esophageal stricture    s/p  dilatation 02-13-2018   History of osteomyelitis 2020   left great toe     Hypercholesterolemia    Hypertension    followed by pcp  (nuclear stress test 03-11-2014 low risk w/ no ischemia, ef 65%   Normocytic anemia    followed by pcp   (03-18-2020 had transfusion's 02/ 2021)   Osteomyelitis of great toe of right foot (HCC) 08/18/2021   Peripheral vascular disease (HCC)    Severe depression (HCC) 06/09/2021   Status post incision and drainage followed by Dr Samuella Cota and pcp   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   (03-17-2020  pt states most of incision is healed with exception an area that is still draining,  daily dressing change),  foot is red but not warm to the touch and has swelling but has improved)   Suicidal ideation 06/09/2021   Syncope and collapse 06/07/2022   Syncope, near 06/20/2022   Thrombocytopenia (HCC)    chronic   Type 2 diabetes mellitus (HCC)     followed by pcp---    (03-18-2020  pt stated does not check blood sugar)   Vaccine counseling 08/29/2022   Past Surgical History:  Past Surgical History:  Procedure Laterality Date   AMPUTATION Left 10/16/2019   Procedure: Left partial second ray resection; placement of antibiotic beads;  Surgeon: Park Liter, DPM;  Location: [facility];  Service: Podiatry;  Laterality: Left;   AMPUTATION Left 11/13/2019   Procedure: Left Midfoot Amputation - Transmetatarsal vs. Lisfranc; Placement antibiotic beads;  Surgeon: Park Liter, DPM;  Location: [facility];  Service: Podiatry;  Laterality: Left;   AMPUTATION Left 12/21/2019   Procedure: Chopart Amputation left foot;  Surgeon: Park Liter, DPM;  Location: [facility];  Service: Podiatry;  Laterality: Left;   AMPUTATION Left 06/24/2020   Procedure: LEFT BELOW KNEE AMPUTATION;  Surgeon: Nadara Mustard, MD;  Location: [facility];  Service: Orthopedics;  Laterality: Left;   AMPUTATION Right 03/29/2021   Procedure: AMPUTATION RAY;  Surgeon: Park Liter, DPM;  [facility]: [facility];  Service: Podiatry;  Laterality: Right;   AMPUTATION Right 04/28/2023   Procedure: RIGHT PARTIAL FOOT AMPUTATION;  Surgeon: Felecia Shelling, DPM;  [facility]: [facility];  Service: Podiatry;  Laterality: Right;   AMPUTATION Right 06/03/2023   Procedure: RIGHT BELOW KNEE AMPUTATION;  Surgeon: Nadara Mustard, MD;  [facility]: [facility];  Service: Orthopedics;  Laterality: Right;   AMPUTATION TOE Left 08/14/2019   Procedure: AMPUTATION GREAT TOE;  Surgeon: Park Liter, DPM;  Location: [facility];  Service: Podiatry;  Laterality: Left;   APPLICATION OF WOUND VAC Left 12/21/2019   Procedure: Application Of Wound Vac;  Surgeon: Park Liter, DPM;  [facility];  Service: Podiatry;  Laterality: Left;   APPLICATION OF WOUND VAC Right 06/03/2023   Procedure: APPLICATION OF WOUND VAC;  Surgeon: Nadara Mustard, MD;  [facility]: [facility];  Service: Orthopedics;  Laterality: Right;   BIOPSY  02/13/2018   Procedure:  BIOPSY;  Surgeon: West Bali, MD;  [facility]: [facility];  Service: Endoscopy;;  transverse colon biopsy, gastric biopsy   BONE BIOPSY Right 04/28/2023   Procedure: BONE BIOPSY;  Surgeon: Felecia Shelling, DPM;  [facility];  Service: Podiatry;  Laterality: Right;   COLONOSCOPY WITH PROPOFOL N/A 02/13/2018   Procedure: COLONOSCOPY WITH PROPOFOL;  Surgeon: West Bali, MD;  [facility]: [facility];  Service: Endoscopy;  Laterality: N/A;  11:15am   ESOPHAGOGASTRODUODENOSCOPY (EGD) WITH PROPOFOL N/A 02/13/2018   Procedure: ESOPHAGOGASTRODUODENOSCOPY (EGD) WITH PROPOFOL;  Surgeon: West Bali, MD;  [facility];  Service: Endoscopy;  Laterality: N/A;   I & D EXTREMITY Left 07/16/2019   Procedure: Insicion  AND DEBRIDEMENT EXTREMITY;  Surgeon: Park Liter, DPM;  [facility];  Service: Podiatry;  Laterality: Left;   I & D EXTREMITY Left 06/22/2020   Procedure: IRRIGATION AND DEBRIDEMENT EXTREMITY, left foot and ankle;  Surgeon: Park Liter, DPM;  [facility];  Service: Podiatry;  Laterality: Left;   I & D EXTREMITY Right 03/29/2021   Procedure: IRRIGATION AND DEBRIDEMENT EXTREMITY;  Surgeon: Park Liter, DPM;  [facility];  Service: Podiatry;  Laterality: Right;   INCISION AND DRAINAGE OF WOUND Left 10/13/2019   Procedure: IRRIGATION AND DEBRIDEMENT WOUND OF LEFT FOOT AND FIRST METATARSAL RESECTION;  Surgeon: Park Liter, DPM;  Location: [facility];  Service: Podiatry;  Laterality: Left;   IRRIGATION AND DEBRIDEMENT FOOT Left 11/11/2019   Procedure: Left Foot Wound Irrigation and Debridement;  Surgeon: Park Liter, DPM;  Location: [facility];  Service: Podiatry;  Laterality: Left;   Left arm     Left arm repair (tendon and artery)   PERCUTANEOUS PINNING  07/16/2019   Procedure: Open Reduction Percutaneous Pinning Extremity;  Surgeon: Park Liter, DPM;  [facility];  Service: Podiatry;;   PILONIDAL CYST EXCISION N/A 08/08/2014   Procedure: CYST  EXCISION PILONIDAL EXTENSIVE;  Surgeon: Dalia Heading, MD;  Location: [facility];  Service: General;  Laterality: N/A;   POLYPECTOMY  02/13/2018   Procedure: POLYPECTOMY;  Surgeon: West Bali, MD;  [facility];  Service: Endoscopy;;  transverse colon polyp hs, rectal polyps times 2   SAVORY DILATION N/A 02/13/2018   Procedure: SAVORY DILATION;  Surgeon: West Bali, MD;  [facility];  Service: Endoscopy;  Laterality: N/A;   TENDON LENGTHENING Right 12/04/2021   Procedure: TENDON LENGTHENING;  Surgeon: Asencion Islam, DPM;  [facility];  Service: Podiatry;  Laterality: Right;   TRANSMETATARSAL AMPUTATION Right 12/04/2021   Procedure: TRANSMETATARSAL AMPUTATION;  Surgeon: Asencion Islam, DPM;  [facility];  Service: Podiatry;  Laterality: Right;   WOUND DEBRIDEMENT Left 09/04/2019   Procedure: DEBRIDEMENT WOUND WITH COMPLEX  REPAIR OF DEHISCENCE;  Surgeon: Park Liter, DPM;  Location: [facility];  Service: Podiatry;  Laterality: Left;  Leave patient on stretcher   WOUND DEBRIDEMENT Left 10/16/2019   Procedure: Left foot wound debridement and closure;  Surgeon: Park Liter, DPM;  Location: [facility];  Service: Podiatry;  Laterality: Left;   WOUND DEBRIDEMENT Left 12/18/2019   Procedure: LEFT FOOT DEBRIDEMENT WITH PARTIAL INCISION OF INFECTED BONE;  Surgeon: Park Liter, DPM;  [facility];  Service: Podiatry;  Laterality: Left;  LEFT FOOT DEBRIDEMENT WITH PARTIAL INCISION OF INFECTED BONE   WOUND DEBRIDEMENT Right 04/01/2021   Procedure: Right foot wound debridement and irrigation and closure, bone biopsy;  Surgeon: Park Liter, DPM;  [facility];  Service: Podiatry;  Laterality: Right;    Assessment & Plan Clinical Impression: Kriyansh Matley is a 57 year old male who presented to the ED on 05/29/2023. He is status post right tendo Achilles lengthening with Chopart amputation of the right foot by Dr. Logan Bores on 04/28/2023 on Augmentin  and followed by ID as outpatient. At follow-up on 7/24, there appeared to be complete breakdown of the amputation stump to the right lower extremity. He was instructed to proceed to the ED for further management and consultation to Dr. Lajoyce Corners. His history is significant for DM s/p left BKA in 2021. Plain radiographs of the right lower extremity demonstrated diffuse soft tissue swelling throughout the lower leg and foot, as well as findings concerning for osteomyelitis involving the calcaneus. Multiple antibiotics started and blood cultures obtained. ID consulted. TTE ordered. Podiatry consulted. Per Dr. Ralene Cork, at this time patient would be best served with below knee amputation as infection has spread too far for salvage. Orthopedics consulted for osteomyelitits and abscess of the right foot. Dr. Lajoyce Corners recommended proceeding with right transtibial amputation and this was performed on 06/03/2023. ID recommendations include: Continue Vancomycin and Unasyn inpatient. On amitryptyline 100mg  po HS, low risk for serotonin syndrome, can consider holding while on linezolid. Can switch to Linezolid 600mg  po bid through 8/15 ( strep mitis/oralis)  and amoxicillin 1 g po tid ( proteus and enterococcus) through 8/10 for treatment of secondary polymicrobial bacteremia when discharged. Tolerating diet. OT/PT evals done. Pt reports b/l phantom pain and pain at surgical incision. PDMP review indicates he takes morphine sulfate ER 30 mg twice a day and oxycodone 30mg  PRN at home in additional to alprazolam.  The patient requires inpatient medicine and rehabilitation evaluations and services for ongoing dysfunction secondary to right BKA and prior left BKA.  Patient transferred to CIR on 06/09/2023 .   Patient currently requires min with mobility secondary to muscle weakness, impaired timing and sequencing and unbalanced muscle activation, and decreased standing balance, decreased balance strategies, and difficulty maintaining  precautions.  Prior to hospitalization, patient was modified independent  with mobility and lived with Daughter in a Mobile home home.  Home access is 4Stairs to enter (patient indicated 1 stair 5-6 inches in height.  Patient daughters home may be an additional option, she has 5-6 stairs.).  Patient will benefit from skilled PT intervention to maximize safe functional mobility, minimize fall risk, and decrease caregiver burden for planned discharge home with 24 hour supervision.  Anticipate patient will benefit from follow up OP at discharge.  PT - End of Session Activity Tolerance: Tolerates 30+ min activity without fatigue Endurance Deficit: No PT Assessment Rehab Potential (ACUTE/IP ONLY): Excellent PT Barriers to Discharge: Inaccessible home environment;Home environment access/layout;Wound Care;Weight bearing restrictions PT Patient demonstrates impairments in the following area(s): Skin Integrity;Balance;Pain;Edema;Motor;Sensory;Safety  PT Transfers Functional Problem(s): Bed Mobility;Bed to Chair;Car PT Locomotion Functional Problem(s): Ambulation;Wheelchair Mobility;Stairs PT Plan PT Intensity: Minimum of 1-2 x/day ,45 to 90 minutes PT Frequency: 5 out of 7 days PT Duration Estimated Length of Stay: 7-10 days PT Treatment/Interventions: Ambulation/gait training;Cognitive remediation/compensation;Discharge planning;DME/adaptive equipment instruction;Functional mobility training;Pain management;Psychosocial support;Therapeutic Activities;Splinting/orthotics;UE/LE Strength taining/ROM;UE/LE Coordination activities;Therapeutic Exercise;Stair training;Wheelchair propulsion/positioning;Skin care/wound management;Patient/family education;Neuromuscular re-education;Community reintegration;Disease management/prevention;Balance/vestibular training PT Transfers Anticipated Outcome(s): mod I with LRAD PT Locomotion Anticipated Outcome(s): mod I w/c level, supervision gait/transfers PT  Recommendation Follow Up Recommendations: Outpatient PT (after prosthetic) Patient destination: Home Equipment Recommended: To be determined   PT Evaluation Precautions/Restrictions Precautions Precautions: Fall Precaution Comments: chronic L BKA, has prosthetic, new R BKA Restrictions Weight Bearing Restrictions: Yes RLE Weight Bearing: Non weight bearing Other Position/Activity Restrictions: RLE limb protector General   Vital SignsTherapy Vitals Pulse Rate: 72 BP: 126/72 Patient Position (if appropriate): Lying Oxygen Therapy SpO2: 98 % O2 Device: Room Air Patient Activity (if Appropriate): In bed Pain Pain Assessment Pain Scale: 0-10 Pain Score: 8  (R leg) Pain Type: Surgical pain Pain Location: Leg Pain Orientation: Right Pain Onset: On-going Pain Interference Pain Interference Pain Effect on Sleep: 4. Almost constantly Pain Interference with Therapy Activities: 1. Rarely or not at all Pain Interference with Day-to-Day Activities: 1. Rarely or not at all Home Living/Prior Functioning Home Living Available Help at Discharge: Available 24 hours/day (discussed hiring a friend to assist during the day) Type of Home: Mobile home Home Access: Stairs to enter (patient indicated 1 stair 5-6 inches in height.  Patient daughters home may be an additional option, she has 5-6 stairs.) Entrance Stairs-Number of Steps: 4 Entrance Stairs-Rails: Can reach both Home Layout: One level Bathroom Shower/Tub: Engineer, manufacturing systems: Standard Additional Comments: may go to daughter's house, has 4-5 steps to enter, one level, tub shower  Lives With: Daughter Prior Function Level of Independence: Independent with transfers;Independent with gait;Requires assistive device for independence (RW)  Able to Take Stairs?: Yes Driving: No Vocation: On disability Vision/Perception  Vision - History Ability to See in Adequate Light: 0 Adequate Perception Perception: Within  Functional Limits Praxis Praxis: WFL  Cognition Overall Cognitive Status: Within Functional Limits for tasks assessed Arousal/Alertness: Awake/alert Orientation Level: Oriented X4 Memory: Appears intact Awareness: Appears intact Problem Solving: Appears intact Behaviors: Other (comment) (very private in relation to bathing and dressing exercise) Safety/Judgment: Appears intact Sensation Sensation Light Touch: Appears Intact Hot/Cold: Not tested Proprioception: Not tested Stereognosis: Not tested Coordination Gross Motor Movements are Fluid and Coordinated: No Fine Motor Movements are Fluid and Coordinated: No Coordination and Movement Description: limited d/t change in COG Motor  Motor Motor: Within Functional Limits Motor - Skilled Clinical Observations: move from one surface to the next   Trunk/Postural Assessment  Cervical Assessment Cervical Assessment: Within Functional Limits Thoracic Assessment Thoracic Assessment: Within Functional Limits Lumbar Assessment Lumbar Assessment: Within Functional Limits Postural Control Postural Control: Within Functional Limits  Balance Balance Balance Assessed:  (Patient able to sit upright unsupported  Simultaneous filing. User may not have seen previous data.) Static Sitting Balance Static Sitting - Balance Support: Feet supported Static Sitting - Level of Assistance: 6: Modified independent (Device/Increase time) Dynamic Sitting Balance Dynamic Sitting - Balance Support: During functional activity Dynamic Sitting - Level of Assistance: 6: Modified independent (Device/Increase time) Sitting balance - Comments: Pt able to lean L/R as needed, able to don/doff RLE limb protector, and L prosthetic with set up Static Standing Balance Static Standing - Balance Support:  Bilateral upper extremity supported Static Standing - Level of Assistance: 5: Stand by assistance Dynamic Standing Balance Dynamic Standing - Balance Support: During  functional activity;Bilateral upper extremity supported Dynamic Standing - Level of Assistance: 4: Min assist Extremity Assessment  RUE Assessment RUE Assessment: Within Functional Limits Passive Range of Motion (PROM) Comments: WFL Active Range of Motion (AROM) Comments: WFL General Strength Comments: 4+/5MMT   RLE Assessment RLE Assessment: Exceptions to [facility] General Strength Comments: hip and knee grossly 4-/5 LLE Assessment LLE Assessment: Within Functional Limits General Strength Comments: Grossly 5/5 but limited by loose prosthetic socket  Care Tool Care Tool Bed Mobility Roll left and right activity   Roll left and right assist level: Supervision/Verbal cueing    Sit to lying activity   Sit to lying assist level: Supervision/Verbal cueing    Lying to sitting on side of bed activity   Lying to sitting on side of bed assist level: the ability to move from lying on the back to sitting on the side of the bed with no back support.: Supervision/Verbal cueing     Care Tool Transfers Sit to stand transfer   Sit to stand assist level: Minimal Assistance - Patient > 75%    Chair/bed transfer   Chair/bed transfer assist level: Minimal Assistance - Patient > 75%     Psychologist, clinical transfer assist level: Contact Guard/Touching assist      Care Tool Locomotion Ambulation   Assist level: Minimal Assistance - Patient > 75% Assistive device: Walker-rolling Max distance: 20 ft  Walk 10 feet activity   Assist level: Minimal Assistance - Patient > 75% Assistive device: Walker-rolling   Walk 50 feet with 2 turns activity Walk 50 feet with 2 turns activity did not occur: Safety/medical concerns      Walk 150 feet activity Walk 150 feet activity did not occur: Safety/medical concerns      Walk 10 feet on uneven surfaces activity Walk 10 feet on uneven surfaces activity did not occur: Safety/medical concerns      Stairs   Assist level: Minimal  Assistance - Patient > 75% Stairs assistive device: 1 hand rail;Other (comment) (shower chair) Max number of stairs: 2  Walk up/down 1 step activity   Walk up/down 1 step (curb) assist level: Minimal Assistance - Patient > 75% Walk up/down 1 step or curb assistive device: 1 hand rail;Other (comment) (shower chair)  Walk up/down 4 steps activity Walk up/down 4 steps activity did not occur: Safety/medical concerns      Walk up/down 12 steps activity Walk up/down 12 steps activity did not occur: Safety/medical concerns      Pick up small objects from floor Pick up small object from the floor (from standing position) activity did not occur: Safety/medical concerns      Wheelchair Is the patient using a wheelchair?: Yes Type of Wheelchair: Manual   Wheelchair assist level: Supervision/Verbal cueing Max wheelchair distance: 150 ft  Wheel 50 feet with 2 turns activity   Assist Level: Supervision/Verbal cueing  Wheel 150 feet activity   Assist Level: Supervision/Verbal cueing    Refer to Care Plan for Long Term Goals  SHORT TERM GOAL WEEK 1 PT Short Term Goal 1 (Week 1): =LTGs d/t ELOS  Recommendations for other services: None   Skilled Therapeutic Intervention Mobility Bed Mobility Bed Mobility: Supine to Sit;Sitting - Scoot to Edge of Bed (CGA) Supine to Sit: Contact Guard/Touching assist Transfers Transfers:  Sit to Stand;Stand Pivot Transfers Sit to Stand: Minimal Assistance - Patient > 75% Stand Pivot Transfers: Minimal Assistance - Patient > 75% Stand Pivot Transfer Details: Verbal cues for precautions/safety;Verbal cues for safe use of DME/AE Transfer (Assistive device): Rolling walker Locomotion  Gait Ambulation: Yes Gait Assistance: Minimal Assistance - Patient > 75% Gait Distance (Feet): 20 Feet Assistive device: Rolling walker Gait Assistance Details: Manual facilitation for placement;Verbal cues for safe use of DME/AE;Verbal cues for precautions/safety;Verbal cues  for gait pattern;Verbal cues for technique Gait Gait: Yes Gait Pattern: Step-to pattern Gait velocity: decr Stairs / Additional Locomotion Stairs: Yes Stairs Assistance: Minimal Assistance - Patient > 75% Stair Management Technique: One rail Left;Other (comment) (shower chair) Number of Stairs: 2 Height of Stairs: 6 Wheelchair Mobility Wheelchair Mobility: Yes Wheelchair Assistance: Doctor, general practice: Both upper extremities Wheelchair Parts Management: Supervision/cueing Distance: 150 ft  Evaluation completed (see details above) with patient education regarding purpose of PT evaluation, PT POC and goals, therapy schedule, weekly team meetings, and other CIR information including safety plan and fall risk safety. Pt performed the below functional mobility tasks with the specified levels of skilled cuing and assistance. Pt with 9/10 pain, but required no intervention at this time.    Discharge Criteria: Patient will be discharged from PT if patient refuses treatment 3 consecutive times without medical reason, if treatment goals not met, if there is a change in medical status, if patient makes no progress towards goals or if patient is discharged from hospital.  The above assessment, treatment plan, treatment alternatives and goals were discussed and mutually agreed upon: by patient  Juluis Rainier 06/10/2023, 12:52 PM

## 2023-06-10 NOTE — Progress Notes (Signed)
PROGRESS NOTE   Subjective/Complaints:  Pt doing alright, slept poorly overnight, states he takes amitriptyline 100mg  at bedtime at home but this hasn't been reordered (on hold d/t linezolid). Willing to try melatonin.  Pain well managed.  LBM yesterday (not documented). Urinating fine. Denies any other complaints or concerns today.   ROS: as per HPI. Denies CP, SOB, abd pain, N/V/D/C, or any other complaints at this time.     Objective:   No results found. No results for input(s): "WBC", "HGB", "HCT", "PLT" in the last 72 hours. Recent Labs    06/10/23 0625  NA 135  K 4.4  CL 100  CO2 26  GLUCOSE 131*  BUN 24*  CREATININE 0.97  CALCIUM 9.2    Intake/Output Summary (Last 24 hours) at 06/10/2023 1021 Last data filed at 06/10/2023 7628 Gross per 24 hour  Intake 480 ml  Output 2150 ml  Net -1670 ml        Physical Exam: Vital Signs Blood pressure 128/72, pulse 69, temperature 98.8 F (37.1 C), temperature source Oral, resp. rate 18, height 6\' 2"  (1.88 m), weight 98.9 kg, SpO2 99%.  General: Alert and oriented x 3, No apparent distress HEENT: Head is normocephalic, atraumatic, PERRLA, EOMI, sclera anicteric Heart: Reg rate and rhythm. No murmurs rubs or gallops Chest: CTA bilaterally without wheezes, rales, or rhonchi; no distress Abdomen: Soft, non-tender, non-distended, bowel sounds positive. Extremities: Left BKA Right BKA- shrinker sock and limb protector placed.  Psych: Pt's affect is appropriate. Pt is cooperative, very pleasant Skin: Clean and intact without signs of breakdown over exposed surfaces Neuro: Follows commands, normal speech and language  PRIOR EXAMS: Neuro: cranial nerves II through XII grossly intact Strength 5 out of 5 in bilateral upper extremities Strength 5 out of 5 in left hip flexion knee extension Able to lift right lower extremity to gravity Sensory exam normal for light touch  and pain in all 4 limbs. No limb ataxia or cerebellar signs. No abnormal tone appreciated.  Musculoskeletal: No abnormal tone noted.  No joint swelling noted  Assessment/Plan: 1. Functional deficits which require 3+ hours per day of interdisciplinary therapy in a comprehensive inpatient rehab setting. Physiatrist is providing close team supervision and 24 hour management of active medical problems listed below. Physiatrist and rehab team continue to assess barriers to discharge/monitor patient progress toward functional and medical goals  Care Tool:  Bathing              Bathing assist       Upper Body Dressing/Undressing Upper body dressing        Upper body assist      Lower Body Dressing/Undressing Lower body dressing            Lower body assist       Toileting Toileting    Toileting assist       Transfers Chair/bed transfer  Transfers assist           Locomotion Ambulation   Ambulation assist              Walk 10 feet activity   Assist  Walk 50 feet activity   Assist           Walk 150 feet activity   Assist           Walk 10 feet on uneven surface  activity   Assist           Wheelchair     Assist               Wheelchair 50 feet with 2 turns activity    Assist            Wheelchair 150 feet activity     Assist          Blood pressure 128/72, pulse 69, temperature 98.8 F (37.1 C), temperature source Oral, resp. rate 18, height 6\' 2"  (1.88 m), weight 98.9 kg, SpO2 99%.  Medical Problem List and Plan: 1. 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  Antithrombotics: -DVT/anticoagulation:  Pharmaceutical: Lovenox 40mg  QD             -antiplatelet therapy: none   3. Pain Management: history of chronic pain (morphine sulfate ER 30 mg tabs #60 filled 05/11/2023, oxycodone 30 mg #120 filled 05/10/2023 Dr. 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 Mood/Behavior/Sleep: History of anxiety and depression; LCSW to evaluate and provide emotional support             -stop amitriptyline today as starting linezolid -continue Xanax as needed (Xanax 1 mg tabs #120 filled 05/10/2023)             -antipsychotic agents: n/a -06/10/23 poor sleep d/t not being on his amitriptyline, will try melatonin 5mg  at bedtime tonight and see how that helps   5. Neuropsych/cognition: This patient is capable of making decisions on his own behalf.   6. Skin/Wound Care: Routine skin care checks             - Prevena wound VAC was removed 8/9 monitor incision   7. 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: CBGs QID; A1c = 5.6 on 04/27/2023 (home metformin 500 mg BID)             -  continue SSI -06/10/23 CBGs variable, cont SSI for now but may benefit from restarting metformin-- defer to weekday team  CBG (last 3)  Recent Labs    06/09/23 1532 06/09/23 2046 06/10/23 0605  GLUCAP 221* 174* 120*      11: GERD: continue Protonix 40mg  QD   12: Left foot  osteomyelitis: Per ID>>"Continue Vancomycin and Unasyn inpatient  (will discontinue at CIR admission) -On amitryptyline 100mg  po HS, low risk for serotonin syndrome, can consider holding while on linezolid>>done  -"Can switch to Linezolid 600mg  po bid through 8/15 ( strep mitis/oralis)  and amoxicillin 1 g po tid ( proteus and enterococcus) through 8/10 for treatment of secondary polymicrobial bacteremia when discharged"   13: Hyponatremia; mild: CMP 06/10/23 with Na 135, monitor on routine labs   14. CAD in native artery. Denies CP    LOS: 1 days A FACE TO FACE EVALUATION WAS PERFORMED  [address] 06/10/2023, 10:21 AM

## 2023-06-11 DIAGNOSIS — K5901 Slow transit constipation: Secondary | ICD-10-CM

## 2023-06-11 LAB — GLUCOSE, CAPILLARY
Glucose-Capillary: 163 mg/dL — ABNORMAL HIGH (ref 70–99)
Glucose-Capillary: 169 mg/dL — ABNORMAL HIGH (ref 70–99)
Glucose-Capillary: 135 mg/dL — ABNORMAL HIGH (ref 70–99)
Glucose-Capillary: 198 mg/dL — ABNORMAL HIGH (ref 70–99)

## 2023-06-11 NOTE — Progress Notes (Signed)
PROGRESS NOTE   Subjective/Complaints:  Pt slept better overnight, thinks melatonin helped.  Pain well managed.  LBM yesterday. Urinating fine.  Denies any other complaints or concerns today on initial visit. Later, called to room by nursing staff stating that pt is adamant that he is leaving AMA. Pt upset about not being able to get into shower chair, explained safety concerns and nursing comfort, alternative options discussed, and ultimately discussed why it would be important to stay. Discussed at length regarding if he left AMA. Asked that he at least wait until tomorrow when he can speak with his primary provider, and discuss his timeline and goals. Pt ultimately agreeable with this.   ROS: as per HPI. Denies CP, SOB, abd pain, N/V/D/C, or any other complaints at this time.     Objective:   No results found. No results for input(s): "WBC", "HGB", "HCT", "PLT" in the last 72 hours. Recent Labs    06/10/23 0625  NA 135  K 4.4  CL 100  CO2 26  GLUCOSE 131*  BUN 24*  CREATININE 0.97  CALCIUM 9.2    Intake/Output Summary (Last 24 hours) at 06/11/2023 1428 Last data filed at 06/11/2023 1312 Gross per 24 hour  Intake 657 ml  Output 1600 ml  Net -943 ml        Physical Exam: Vital Signs Blood pressure 122/69, pulse 75, temperature 99 F (37.2 C), temperature source Oral, resp. rate 18, height 6\' 2"  (1.88 m), weight 98.9 kg, SpO2 98%.  General: Alert and oriented x 3, No apparent distress HEENT: Head is normocephalic, atraumatic, PERRLA, EOMI, sclera anicteric Heart: Reg rate and rhythm. No murmurs rubs or gallops Chest: CTA bilaterally without wheezes, rales, or rhonchi; no distress Abdomen: Soft, non-tender, non-distended, bowel sounds positive. Extremities: Left BKA Right BKA- shrinker sock and limb protector placed.  Psych: Pt's affect is irritable on second visit, visibly frustrated, but calm.  Skin: Clean  and intact without signs of breakdown over exposed surfaces Neuro: Follows commands, normal speech and language  PRIOR EXAMS: Neuro: cranial nerves II through XII grossly intact Strength 5 out of 5 in bilateral upper extremities Strength 5 out of 5 in left hip flexion knee extension Able to lift right lower extremity to gravity Sensory exam normal for light touch and pain in all 4 limbs. No limb ataxia or cerebellar signs. No abnormal tone appreciated.  Musculoskeletal: No abnormal tone noted.  No joint swelling noted  Assessment/Plan: 1. Functional deficits which require 3+ hours per day of interdisciplinary therapy in a comprehensive inpatient rehab setting. Physiatrist is providing close team supervision and 24 hour management of active medical problems listed below. Physiatrist and rehab team continue to assess barriers to discharge/monitor patient progress toward functional and medical goals  Care Tool:  Bathing    Body parts bathed by patient: Right arm, Left arm, Chest, Abdomen, Front perineal area, Buttocks, Right upper leg, Left upper leg         Bathing assist       Upper Body Dressing/Undressing Upper body dressing   What is the patient wearing?: Pull over shirt    Upper body assist Assist Level: Set up assist  Lower Body Dressing/Undressing Lower body dressing      What is the patient wearing?: Pants     Lower body assist Assist for lower body dressing: Set up assist     Toileting Toileting    Toileting assist Assist for toileting: Contact Guard/Touching assist     Transfers Chair/bed transfer  Transfers assist     Chair/bed transfer assist level: Minimal Assistance - Patient > 75%     Locomotion Ambulation   Ambulation assist      Assist level: Minimal Assistance - Patient > 75% Assistive device: Walker-rolling Max distance: 20 ft   Walk 10 feet activity   Assist     Assist level: Minimal Assistance - Patient > 75% Assistive  device: Walker-rolling   Walk 50 feet activity   Assist Walk 50 feet with 2 turns activity did not occur: Safety/medical concerns         Walk 150 feet activity   Assist Walk 150 feet activity did not occur: Safety/medical concerns         Walk 10 feet on uneven surface  activity   Assist Walk 10 feet on uneven surfaces activity did not occur: Safety/medical concerns         Wheelchair     Assist Is the patient using a wheelchair?: Yes Type of Wheelchair: Manual    Wheelchair assist level: Supervision/Verbal cueing Max wheelchair distance: 150 ft    Wheelchair 50 feet with 2 turns activity    Assist        Assist Level: Supervision/Verbal cueing   Wheelchair 150 feet activity     Assist      Assist Level: Supervision/Verbal cueing   Blood pressure 122/69, pulse 75, temperature 99 F (37.2 C), temperature source Oral, resp. rate 18, height 6\' 2"  (1.88 m), weight 98.9 kg, SpO2 98%.  Medical Problem List and Plan: 1. 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 Wanted to leave AMA, advised that he has capacity to make that decision but explained why that might be unwise-- pt agreeable to wait until tomorrow to talk to Dr. Carlis Abbott about things.  -May benefit from seeing [facility] for adjustment or replacement to his L BKA socket as outpatient    2.  Antithrombotics: -DVT/anticoagulation:  Pharmaceutical: Lovenox 40mg  QD             -antiplatelet therapy: none   3. Pain Management: history  of chronic pain (morphine sulfate ER 30 mg tabs #60 filled 05/11/2023, oxycodone 30 mg #120 filled 05/10/2023 Dr. 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  Mood/Behavior/Sleep: History of anxiety and depression; LCSW to evaluate and provide emotional support             -stop amitriptyline today as starting linezolid -continue Xanax as needed (Xanax 1 mg tabs #120 filled 05/10/2023)             -antipsychotic agents: n/a -06/10/23 poor sleep d/t not being on his amitriptyline, will try melatonin 5mg  at bedtime tonight and see how that helps-- 06/11/23 helped   5. Neuropsych/cognition: This patient is capable of making decisions on his own behalf.   6. Skin/Wound Care: Routine skin care checks             - Prevena wound VAC was removed 8/9 monitor incision   7. 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: CBGs QID; A1c = 5.6 on 04/27/2023 (home metformin 500 mg BID)             -continue SSI -8/10-11/24 CBGs variable, cont SSI for now but may benefit from restarting metformin-- defer to weekday team  CBG (last 3)  Recent Labs    06/10/23 2036 06/11/23 0555 06/11/23 1144  GLUCAP 201* 163* 169*      11: GERD: continue Protonix 40mg  QD   12: Left foot osteomyelitis: Per ID>>"Continue Vancomycin and  Unasyn inpatient  (will discontinue at CIR admission) -On amitryptyline 100mg  po HS, low risk for serotonin syndrome, can consider holding while on linezolid>>done  -"Can switch to Linezolid 600mg  po bid through 8/15 ( strep mitis/oralis)  and amoxicillin 1 g po tid ( proteus and enterococcus) through 8/10 for treatment of secondary polymicrobial bacteremia when discharged"   13: Hyponatremia; mild: CMP 06/10/23 with Na 135, monitor on routine labs   14. CAD in native artery. Denies CP  I spent >74mins performing patient care related activities, including face to face time, documentation time, discussion of importance of rehab, discussion of leaving AMA vs staying, discussion of options for showering with patient and nursing staff, and overall coordination of care.     LOS: 2 days A FACE TO FACE EVALUATION WAS PERFORMED  [address] 06/11/2023, 2:28 PM

## 2023-06-11 NOTE — Progress Notes (Signed)
11:30 Pt adamant on taking shower. Both this nurse and tech tried to explain to pt it is a safety issue to shower at this time, but we can do a bed bath or wash up at the sink. Pt got upset and says he wants to leave AMA. Pt states, "He can do this on his own at home, I am not getting any therapy here." Explained to pt he will have therapy back on normal schedule tomorrow. Pt still wants to leave when his visitor arrives.   11:50 Mercedes, PA in to talk to pt followed by this nurse. Pt is still frustrated, but he states that he will stay one more night to talk to Dr. Carlis Abbott in the morning. Offered another time to get bathed and pt refuses saying maybe later.

## 2023-06-12 LAB — BASIC METABOLIC PANEL WITH GFR
Anion gap: 4 — ABNORMAL LOW (ref 5–15)
BUN: 20 mg/dL (ref 6–20)
CO2: 31 mmol/L (ref 22–32)
Calcium: 9 mg/dL (ref 8.9–10.3)
Chloride: 101 mmol/L (ref 98–111)
Creatinine, Ser: 1.1 mg/dL (ref 0.61–1.24)
GFR, Estimated: >60 mL/min
Glucose, Bld: 125 mg/dL — ABNORMAL HIGH (ref 70–99)
Potassium: 4.4 mmol/L (ref 3.5–5.1)
Sodium: 136 mmol/L (ref 135–145)

## 2023-06-12 LAB — GLUCOSE, CAPILLARY: Glucose-Capillary: 122 mg/dL — ABNORMAL HIGH (ref 70–99)

## 2023-06-12 LAB — CBC
HCT: 33.7 % — ABNORMAL LOW (ref 39.0–52.0)
Hemoglobin: 11.3 g/dL — ABNORMAL LOW (ref 13.0–17.0)
MCH: 31.2 pg (ref 26.0–34.0)
MCHC: 33.5 g/dL (ref 30.0–36.0)
MCV: 93.1 fL (ref 80.0–100.0)
Platelets: 208 K/uL (ref 150–400)
RBC: 3.62 MIL/uL — ABNORMAL LOW (ref 4.22–5.81)
RDW: 13.2 % (ref 11.5–15.5)
WBC: 6.5 K/uL (ref 4.0–10.5)
nRBC: 0 % (ref 0.0–0.2)

## 2023-06-12 MED ORDER — METFORMIN HCL 500 MG PO TABS: 500.0000 mg | ORAL_TABLET | Freq: Every day | ORAL | Status: DC

## 2023-06-12 NOTE — Progress Notes (Signed)
Inpatient Rehabilitation Center Individual Statement of Services  Patient Name:  Eugene Diaz  Date:  06/12/2023  Welcome to the Inpatient Rehabilitation Center.  Our goal is to provide you with an individualized program based on your diagnosis and situation, designed to meet your specific needs.  With this comprehensive rehabilitation program, you will be expected to participate in at least 3 hours of rehabilitation therapies Monday-Friday, with modified therapy programming on the weekends.  Your rehabilitation program will include the following services:  Physical Therapy (PT), Occupational Therapy (OT), Speech Therapy (ST), 24 hour per day rehabilitation nursing, Therapeutic Recreaction (TR), Psychology, Neuropsychology, Care Coordinator, Rehabilitation Medicine, Nutrition Services, Pharmacy Services, and Other  Weekly team conferences will be held on Wednesdays to discuss your progress.  Your Inpatient Rehabilitation Care Coordinator will talk with you frequently to get your input and to update you on team discussions.  Team conferences with you and your family in attendance may also be held.  Expected length of stay: 9-12 Days  Overall anticipated outcome: mod I w/c level   Depending on your progress and recovery, your program may change. Your Inpatient Rehabilitation Care Coordinator will coordinate services and will keep you informed of any changes. Your Inpatient Rehabilitation Care Coordinator's name and contact numbers are listed  below.  The following services may also be recommended but are not provided by the Inpatient Rehabilitation Center:   Home Health Rehabiltiation Services Outpatient Rehabilitation Services    Arrangements will be made to provide these services after discharge if needed.  Arrangements include referral to agencies that provide these services.  Your insurance has been verified to be:  [employer] Your primary doctor is:  Elfredia Nevins,  MD  Pertinent information will be shared with your doctor and your insurance company.  Inpatient Rehabilitation Care Coordinator:  Lavera Guise, [place] [phone] or [phone]  Information discussed with and copy given to patient by: Andria Rhein, 06/12/2023, 11:01 AM

## 2023-06-12 NOTE — Discharge Instructions (Addendum)
Inpatient Rehab Discharge Instructions  Eugene Diaz [facility] Discharge date and time: 06/16/2023  Activities/Precautions/ Functional Status: Activity: no lifting, driving, or strenuous exercise until cleared by MD Diet: diabetic diet Wound Care: keep wound clean and dry Functional status:  ___ No restrictions     ___ Walk up steps independently ___ 24/7 supervision/assistance   ___ Walk up steps with assistance _x__ Intermittent supervision/assistance  ___ Bathe/dress independently ___ Walk with walker     ___ Bathe/dress with assistance ___ Walk Independently    ___ Shower independently ___ Walk with assistance    __x_ Shower with assistance __x_ No alcohol     ___ Return to work/school ________   Special Instructions: No driving, alcohol consumption or tobacco use.  Recommend checking fingerstick blood sugars four (4) times daily and record. Bring this information with you to follow-up appointment with PCP.  Recommend daily BP measurement in same arm and record time of day. Bring this information with you to follow-up appointment with PCP.  COMMUNITY REFERRALS UPON DISCHARGE:       Outpatient:     PT     OT                       Agency: OP at [facility]         Phone:[phone]                                    Appointment Date/Time:TBD     My questions have been answered and I understand these instructions. I will adhere to these goals and the provided educational materials after my discharge from the hospital.  Patient/Caregiver Signature _______________________________ Date __________  Clinician Signature _______________________________________ Date __________  Please bring this form and your medication list with you to all your follow-up doctor's appointments.

## 2023-06-12 NOTE — Progress Notes (Signed)
Inpatient Rehabilitation  Patient information reviewed and entered into eRehab system by Melissa M. Bowie, M.A., CCC/SLP, PPS Coordinator.  Information including medical coding, functional ability and quality indicators will be reviewed and updated through discharge.    

## 2023-06-12 NOTE — Discharge Summary (Signed)
Physician Discharge Summary  Patient ID: Eugene Diaz MRN: [id number] DOB/AGE: [birth year] 57 y.o.  Admit date: 06/09/2023 Discharge date: 06/16/2023  Discharge Diagnoses:  Principal Problem:   S/P BKA (below knee amputation), right (HCC) Active problems: Debility secondary to right below the knee amputation Left below the knee amputation status Hypertension Diabetes mellitus type 2 GERD Secondary polymicrobial bacteremia Hyponatremia Coronary artery disease Phantom pain  Discharged Condition: good  Significant Diagnostic Studies: none  Labs:  Basic Metabolic Panel:    Latest Ref Rng & Units 06/12/2023    5:37 AM 06/10/2023    6:25 AM 06/04/2023    7:57 AM  BMP  Glucose 70 - 99 mg/dL 086  578  469   BUN 6 - 20 mg/dL 20  24  21    Creatinine 0.61 - 1.24 mg/dL 6.29  5.28  4.13   Sodium 135 - 145 mmol/L 136  135  133   Potassium 3.5 - 5.1 mmol/L 4.4  4.4  4.1   Chloride 98 - 111 mmol/L 101  100  104   CO2 22 - 32 mmol/L 31  26  21    Calcium 8.9 - 10.3 mg/dL 9.0  9.2  8.6     CBC:    Latest Ref Rng & Units 06/12/2023    5:37 AM 06/01/2023    4:44 AM 05/31/2023    6:17 AM  CBC  WBC 4.0 - 10.5 K/uL 6.5  6.4  5.8   Hemoglobin 13.0 - 17.0 g/dL 24.4  01.0  27.2   Hematocrit 39.0 - 52.0 % 33.7  39.4  38.1   Platelets 150 - 400 K/uL 208  289  246     CBG:  No results for input(s): "GLUCAP" in the last 72 hours.   Brief HPI:   Eugene Diaz is a 57 y.o. male who presented to the ED on 05/29/2023. He is status post right tendo Achilles lengthening with Chopart amputation of the right foot by Dr. Logan Bores on 04/28/2023 on Augmentin and followed by ID as outpatient. At follow-up on 7/24, there appeared to be complete breakdown of the amputation stump to the right lower extremity. He was instructed to proceed to the ED for further management and consultation to Dr. Lajoyce Corners. His history is significant for DM s/p left BKA in 2021. Plain radiographs of the right lower extremity demonstrated  diffuse soft tissue swelling throughout the lower leg and foot, as well as findings concerning for osteomyelitis involving the calcaneus. Multiple antibiotics started and blood cultures obtained. ID consulted. TTE ordered. Podiatry consulted. Per Dr. Ralene Cork, at this time patient would be best served with below knee amputation as infection has spread too far for salvage. Orthopedics consulted for osteomyelitits and abscess of the right foot. Dr. Lajoyce Corners recommended proceeding with right transtibial amputation and this was performed on 06/03/2023. ID recommendations include: Continue Vancomycin and Unasyn inpatient. On amitryptyline 100mg  po HS, low risk for serotonin syndrome, can consider holding while on linezolid. Can switch to Linezolid 600mg  po bid through 8/15 ( strep mitis/oralis)  and amoxicillin 1 g po tid ( proteus and enterococcus) through 8/10 for treatment of secondary polymicrobial bacteremia when discharged. Tolerating diet. OT/PT evals done. Pt reports b/l phantom pain and pain at surgical incision. PDMP review indicates he takes morphine sulfate ER 30 mg twice a day and oxycodone 30mg  PRN at home in additional to alprazolam.    Hospital Course: Eugene Diaz was admitted to rehab 06/09/2023 for inpatient therapies to consist of  PT, ST and OT at least three hours five days a week. Past admission physiatrist, therapy team and rehab RN have worked together to provide customized collaborative inpatient rehab.  On admission to inpatient rehab, his intravenous vancomycin and Unasyn were discontinued and he was switched to oral linezolid 600 mg twice daily through August 15 and amoxicillin 1 g 3 times daily through August 10 for treatment of secondary polymicrobial bacteremia. Follow-up labs stable. These were the recommendations by infectious disease consultant. Started melatonin 5 mg at bedtime for insomnia. Gabapentin 100 mg started q HS for phantom pain. Antibiotics completed and ok to resume  amitriptyline.   Blood pressures were monitored on TID basis and amlodipine 10 mg daily continued. Decreased to 5 mg daily.  Diabetes has been monitored with ac/hs CBG checks and SSI was use prn for tighter BS control. Resumed home metformin 500 mg daily on 8/12 and increased to BID on 8/13.   Rehab course: During patient's stay in rehab weekly team conferences were held to monitor patient's progress, set goals and discuss barriers to discharge. At admission, patient required  min with mobility and with basic self-care skills.  He has had improvement in activity tolerance, balance, postural control as well as ability to compensate for deficits. He has had improvement in functional use RUE/LUE  and RLE/LLE as well as improvement in awareness.  Patient has met 3 of 8 long term goals due to improved activity tolerance, improved balance, improved postural control, increased strength, increased range of motion, and improved coordination.  Patient to discharge at a wheelchair level Supervision. Pt's family did not attend family education training prior to discharge, as pt requesting to discharge from rehab program ASAP. Have reinforced to pt recommendation to use RW vs crutches with mobility for safety and due to loose fitting prosthetic, however pt reports he will use crutches regardless of recommendation - pt verbalizes doing things "his own way".    Outpatient OT and PT arranged at [facility].  Discharge disposition: 01-Home or Self Care     Diet: carb modified  Special Instructions: No driving, alcohol consumption or tobacco use.  Follow-up with [facility] regarding fit of left prothesis.  Recommend checking fingerstick blood sugars four (4) times daily and record. Bring this information with you to follow-up appointment with PCP.  Recommend daily BP measurement in same arm and record time of day. Bring this information with you to follow-up appointment with PCP.  30-35 minutes were  spent on discharge planning and discharge summary. Discharge Instructions     Ambulatory referral to Physical Medicine Rehab   Complete by: As directed    Hospital follow-up   Discharge patient   Complete by: As directed    Discharge disposition: 01-Home or Self Care   Discharge patient date: 06/16/2023      Allergies as of 06/16/2023   No Known Allergies      Medication List     STOP taking these medications    amoxicillin 500 MG tablet Commonly known as: AMOXIL   diclofenac Sodium 1 % Gel Commonly known as: VOLTAREN   linezolid 600 MG tablet Commonly known as: ZYVOX   polyethylene glycol 17 g packet Commonly known as: MIRALAX / GLYCOLAX       TAKE these medications    acetaminophen 325 MG tablet Commonly known as: TYLENOL Take 1-2 tablets (325-650 mg total) by mouth every 4 (four) hours as needed for mild pain. What changed:  how much to take when to take  this   albuterol 108 (90 Base) MCG/ACT inhaler Commonly known as: VENTOLIN HFA Inhale 1-2 puffs into the lungs every 4 (four) hours as needed for wheezing or shortness of breath.   ALPRAZolam 1 MG tablet Commonly known as: XANAX Take 1 tablet (1 mg total) by mouth 4 (four) times daily as needed for anxiety or sleep.   amitriptyline 100 MG tablet Commonly known as: ELAVIL Take 100 mg by mouth at bedtime.   amLODipine 5 MG tablet Commonly known as: NORVASC Take 1 tablet (5 mg total) by mouth daily. What changed:  medication strength how much to take   ascorbic acid 1000 MG tablet Commonly known as: VITAMIN C Take 1 tablet (1,000 mg total) by mouth daily.   folic acid 1 MG tablet Commonly known as: FOLVITE Take 1 tablet (1 mg total) by mouth daily.   gabapentin 100 MG capsule Commonly known as: NEURONTIN Take 1 capsule (100 mg total) by mouth 3 (three) times daily.   melatonin 5 MG Tabs Take 1 tablet (5 mg total) by mouth at bedtime.   metFORMIN 500 MG tablet Commonly known as:  GLUCOPHAGE Take 1 tablet (500 mg total) by mouth 2 (two) times daily with a meal. What changed: when to take this   methocarbamol 500 MG tablet Commonly known as: ROBAXIN Take 1 tablet (500 mg total) by mouth every 6 (six) hours as needed for muscle spasms.   morphine 15 MG 12 hr tablet Commonly known as: MS CONTIN Take 1 tablet (15 mg total) by mouth every 12 (twelve) hours.   multivitamin with minerals Tabs tablet Take 1 tablet by mouth daily.   oxycodone 30 MG immediate release tablet Commonly known as: ROXICODONE Take 1 tablet (30 mg total) by mouth every 8 (eight) hours as needed for severe pain.   rosuvastatin 20 MG tablet Commonly known as: CRESTOR Take 1 tablet (20 mg total) by mouth daily.   senna-docusate 8.6-50 MG tablet Commonly known as: Senokot-S Take 1 tablet by mouth at bedtime. What changed: when to take this   thiamine 100 MG tablet Commonly known as: VITAMIN B1 Take 1 tablet (100 mg total) by mouth daily.   zinc sulfate 220 (50 Zn) MG capsule Take 1 capsule (220 mg total) by mouth daily.        Follow-up Information     Raulkar, Drema Pry, MD Follow up.   Specialty: Physical Medicine and Rehabilitation Why: office will call you to arrange your appt (sent) Contact information: [address] [place] [address] [phone]         Elfredia Nevins, MD Follow up.   Specialty: Internal Medicine Why: Call the office in 1-2 days to make arranngements for hospital follow-up appointment. Contact information: [address] [place] [address] [phone]         Nadara Mustard, MD Follow up.   Specialty: Orthopedic Surgery Why: Call the office in 1-2 days to make arranngements for hospital follow-up appointment. Contact information: [address] [place] [address] [phone]         Daiva Eves, Lisette Grinder, MD. Go to.   Specialty: Infectious Diseases Why: Discuss resumption of amitriptyline. Contact  information: [address] [place] [address] [phone]         Chilton Si, MD. Go to.   Specialty: Cardiology Contact information: [address] [place] [address] [phone]                 Signed:  Lynnell Jude Jenaveve Fenstermaker 06/19/2023, 2:40 PM

## 2023-06-12 NOTE — Progress Notes (Signed)
Physical Therapy Session Note  Patient Details  Name: Eugene Diaz MRN: [id number] Date of Birth: [birth year]  Today's Date: 06/12/2023 PT Individual Time: 1000-1110 PT Individual Time Calculation (min): 70 min   Short Term Goals: Week 1:  PT Short Term Goal 1 (Week 1): =LTGs d/t ELOS  Skilled Therapeutic Interventions/Progress Updates:   Received pt sitting in WC, pt agreeable to PT treatment, and reported pain 7/10 in R residual limb (premedicated) - educated pt on desensitization techniques. Session with emphasis on functional mobility/transfers, generalized strengthening and endurance, dynamic standing balance/coordination, and gait training. Pt performed WC mobility 118ft x 2 trials using BUE and supervision to/from main therapy gym - emphasis on UE strength.   Stood from [facility] with RW and min A and ambulated 39ft with RW and min A to fatigue using a "hop to" pattern - limited by increased pain in L knee and pistoning in socket - discussed having daughter bring in extra socks to take up volume. Pt stood from elevated EOM with RW and min A x 3 trials and performed the following standing exercises with emphasis on LE strength/ROM: -R hip abduction 2x10 -R hip flexion 2x10 -R hip extension 2x10 Pt able to doff/don limb guard without assist throughout session. Pt transferred sit<>supine on wedge with supervision and performed the following exercises with emphasis on contracture prevention, UE/LE strength, and ROM: -supine R SLR 2x10 -supine R hip abduction 2x10 -supine hip adduction ball squeezes 10x5 second isometric hold  -supine L single leg bridge 2x10 -supine chest press with 10lb dowel 2x15 -prone hamstring curls 2x10 bilaterally -prone hip extension x10 bilaterally Pt transferred prone<>sitting EOM with supervision and performed unsupported sitting lateral trunk rotations with 4.4lb medicine ball 2x10 bilaterally. Pt able to don/doff prosthetic independently and transferred mat<>WC  stand<>pivot with RW and min A. Returned to room and concluded session with pt sitting in WC, needs within reach, and seatbelt alarm on. Safety plan updated and RN/NT notified.   Therapy Documentation Precautions:  Precautions Precautions: Fall Precaution Comments: chronic L BKA, has prosthetic, new R BKA Restrictions Weight Bearing Restrictions: Yes RLE Weight Bearing: Non weight bearing Other Position/Activity Restrictions: RLE limb protector   Therapy/Group: Individual Therapy Marlana Salvage Zaunegger Blima Rich PT, DPT 06/12/2023, 7:16 AM

## 2023-06-12 NOTE — IPOC Note (Signed)
Overall Plan of Care [facility]) Patient Details Name: Eugene Diaz MRN: [id number] DOB: 1965/12/22  Admitting Diagnosis: S/P BKA (below knee amputation), right [facility])  Hospital Problems: Principal Problem:   S/P BKA (below knee amputation), right (HCC)     Functional Problem List: Nursing Bowel, Safety, Edema, Endurance, Medication Management, Pain  PT Skin Integrity, Balance, Pain, Edema, Motor, Sensory, Safety  OT Balance  SLP    TR         Basic ADL's: OT Other (comment) (functional transfers during task performance)     Advanced  ADL's: OT       Transfers: PT Bed Mobility, Bed to Chair, Car  OT Tub/Shower (patient has tub/shower  would benefit from practice getting in and out safely)     Locomotion: PT Ambulation, Wheelchair Mobility, Stairs     Additional Impairments: OT    SLP        TR      Anticipated Outcomes Item Anticipated Outcome  Self Feeding ModI  Swallowing      Basic self-care  ModI  Toileting  ModI   Bathroom Transfers ModI  Bowel/Bladder  continent B/B  Transfers  mod I with LRAD  Locomotion  mod I w/c level, supervision gait/transfers  Communication     Cognition     Pain  less than 4  Safety/Judgment  no falls   Therapy Plan: PT Intensity: Minimum of 1-2 x/day ,45 to 90 minutes PT Frequency: 5 out of 7 days PT Duration Estimated Length of Stay: 7-10 days OT Frequency: 5 out of 7 days, Total of 15 hours over 7 days of combined therapies OT Duration/Estimated Length of Stay: 7-10     Team Interventions: Nursing Interventions Patient/Family Education, Pain Management, Bowel Management, Medication Management, Discharge Planning, Skin Care/Wound Management, Psychosocial Support, Disease Management/Prevention  PT interventions Ambulation/gait training, Cognitive remediation/compensation, Discharge planning, DME/adaptive equipment instruction, Functional mobility training, Pain management, Psychosocial support, Therapeutic  Activities, Splinting/orthotics, UE/LE Strength taining/ROM, UE/LE Coordination activities, Therapeutic Exercise, Stair training, Wheelchair propulsion/positioning, Skin care/wound management, Patient/family education, Neuromuscular re-education, Community reintegration, Disease management/prevention, Warden/ranger  OT Interventions Self Care/advanced ADL retraining, Therapeutic Exercise, UE/LE Coordination activities, UE/LE Strength taining/ROM, Warden/ranger, DME/adaptive equipment instruction  SLP Interventions    TR Interventions    SW/CM Interventions Discharge Planning, Psychosocial Support, Patient/Family Education, Disease Management/Prevention   Barriers to Discharge MD  Medical stability  Nursing Decreased caregiver support, Home environment access/layout, Wound Care, Weight, Weight bearing restrictions 1 level with 4-5 ste to enter  PT Inaccessible home environment, Home environment access/layout, Wound Care, Weight bearing restrictions    OT      SLP      SW Lack of/limited family support, Insurance for SNF coverage, Decreased caregiver support     Team Discharge Planning: Destination: PT-Home ,OT- Home , SLP-  Projected Follow-up: PT-Outpatient PT (after prosthetic), OT-  None, SLP-  Projected Equipment Needs: PT-To be determined, OT- 3 in 1 bedside comode, SLP-  Equipment Details: PT- , OT-to be determined Patient/family involved in discharge planning: PT- Patient,  OT-Patient, SLP-   MD ELOS: 8 days Medical Rehab Prognosis:  Excellent Assessment: The patient has been admitted for CIR therapies with the diagnosis of right BKA. The team will be addressing functional mobility, strength, stamina, balance, safety, adaptive techniques and equipment, self-care, bowel and bladder mgt, patient and caregiver education.. Goals have been set at [facility]I. Anticipated discharge destination is home.         See Team Conference Notes for  weekly updates to the  plan of care

## 2023-06-12 NOTE — Progress Notes (Signed)
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) 05/23/2022   Depression    Diabetic neuropathy (HCC)    Edema of both lower extremities    Essential tremor    neurologist-- dr patel--- due to alcohol abuse   GERD (gastroesophageal reflux disease)    History of cellulitis 2020   left lower leg;   recurrent 03-25-2020   History of esophageal stricture    s/p  dilatation 02-13-2018   History of osteomyelitis 2020   left great toe     Hypercholesterolemia    Hypertension    followed by pcp  (nuclear stress test 03-11-2014 low risk w/ no ischemia, ef 65%   Normocytic anemia    followed by pcp   (03-18-2020 had transfusion's 02/ 2021)   Osteomyelitis of great toe of right foot (HCC) 08/18/2021   Peripheral vascular disease (HCC)    Severe depression (HCC) 06/09/2021   Status post incision and drainage followed by Dr Samuella Cota and pcp   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   (03-17-2020  pt states most of incision is healed with exception an area that is still draining,  daily dressing change),  foot is red but not warm to the touch and has swelling but has improved)   Suicidal ideation 06/09/2021   Syncope and collapse 06/07/2022   Syncope, near 06/20/2022   Thrombocytopenia (HCC)    chronic   Type 2 diabetes mellitus (HCC)    followed by pcp---    (03-18-2020  pt stated does not check blood sugar)   Vaccine counseling 08/29/2022   Past Surgical History:  Past Surgical History:  Procedure  Laterality Date   AMPUTATION Left 10/16/2019   Procedure: Left partial second ray resection; placement of antibiotic beads;  Surgeon: Park Liter, DPM;  Location: [facility];  Service: Podiatry;  Laterality: Left;   AMPUTATION Left 11/13/2019   Procedure: Left Midfoot Amputation - Transmetatarsal vs. Lisfranc; Placement antibiotic beads;  Surgeon: Park Liter, DPM;  Location: [facility];  Service: Podiatry;  Laterality: Left;   AMPUTATION Left 12/21/2019   Procedure: Chopart Amputation left foot;  Surgeon: Park Liter, DPM;  Location: [facility];  Service: Podiatry;  Laterality: Left;   AMPUTATION Left 06/24/2020   Procedure: LEFT BELOW KNEE AMPUTATION;  Surgeon: Nadara Mustard, MD;  Location: [facility] OR;  Service: Orthopedics;  Laterality: Left;   AMPUTATION Right 03/29/2021   Procedure: AMPUTATION RAY;  Surgeon: Park Liter, DPM;  Location: [facility];  Service: Podiatry;  Laterality: Right;   AMPUTATION Right 04/28/2023   Procedure: RIGHT PARTIAL FOOT AMPUTATION;  Surgeon: Felecia Shelling, DPM;  Location: [facility];  Service: Podiatry;  Laterality: Right;   AMPUTATION Right 06/03/2023   Procedure: RIGHT BELOW KNEE AMPUTATION;  Surgeon: Nadara Mustard, MD;  Location: [facility] OR;  Service: Orthopedics;  Laterality: Right;   AMPUTATION TOE Left 08/14/2019   Procedure: AMPUTATION GREAT TOE;  Surgeon: Park Liter, DPM;  Location: [facility];  Service: Podiatry;  Laterality: Left;   APPLICATION OF WOUND VAC Left 12/21/2019   Procedure: Application Of Wound Vac;  Surgeon: Park Liter, DPM;  [facility]: [facility];  Service: Podiatry;  Laterality: Left;   APPLICATION OF WOUND VAC Right 06/03/2023   Procedure: APPLICATION OF WOUND VAC;  Surgeon: Nadara Mustard, MD;  [facility]: [facility];  Service: Orthopedics;  Laterality: Right;   BIOPSY  02/13/2018   Procedure: BIOPSY;  Surgeon: West Bali, MD;  [facility];  Service: Endoscopy;;  transverse colon biopsy, gastric biopsy   BONE BIOPSY Right 04/28/2023    Procedure: BONE BIOPSY;  Surgeon: Felecia Shelling, DPM;  [facility]: [facility];  Service: Podiatry;  Laterality: Right;   COLONOSCOPY WITH PROPOFOL N/A 02/13/2018   Procedure: COLONOSCOPY WITH PROPOFOL;  Surgeon: West Bali, MD;  [facility];  Service: Endoscopy;  Laterality: N/A;  11:15am   ESOPHAGOGASTRODUODENOSCOPY (EGD) WITH PROPOFOL N/A 02/13/2018   Procedure: ESOPHAGOGASTRODUODENOSCOPY (EGD) WITH PROPOFOL;  Surgeon: West Bali, MD;  [facility];  Service: Endoscopy;  Laterality: N/A;   I & D EXTREMITY Left 07/16/2019   Procedure: Insicion  AND DEBRIDEMENT EXTREMITY;  Surgeon: Park Liter, DPM;  [facility];  Service: Podiatry;  Laterality: Left;   I & D EXTREMITY Left 06/22/2020   Procedure: IRRIGATION AND DEBRIDEMENT EXTREMITY, left foot and ankle;  Surgeon: Park Liter, DPM;  [facility];  Service: Podiatry;  Laterality: Left;   I & D EXTREMITY Right 03/29/2021   Procedure: IRRIGATION AND DEBRIDEMENT EXTREMITY;  Surgeon: Park Liter, DPM;  [facility];  Service: Podiatry;  Laterality: Right;   INCISION AND DRAINAGE OF WOUND Left 10/13/2019   Procedure: IRRIGATION AND DEBRIDEMENT WOUND OF LEFT FOOT AND FIRST METATARSAL RESECTION;  Surgeon: Park Liter, DPM;  [facility]: [facility];  Service: Podiatry;  Laterality: Left;   IRRIGATION AND DEBRIDEMENT FOOT Left 11/11/2019   Procedure: Left Foot Wound Irrigation and Debridement;  Surgeon: Park Liter, DPM;  Location: [facility];  Service: Podiatry;  Laterality: Left;   Left arm     Left arm repair (tendon and artery)   PERCUTANEOUS PINNING  07/16/2019   Procedure: Open Reduction Percutaneous Pinning Extremity;  Surgeon: Park Liter, DPM;  [facility];  Service: Podiatry;;   PILONIDAL CYST EXCISION N/A 08/08/2014   Procedure: CYST EXCISION PILONIDAL EXTENSIVE;  Surgeon: Dalia Heading, MD;  [facility]: [facility];  Service: General;  Laterality: N/A;   POLYPECTOMY  02/13/2018   Procedure:  POLYPECTOMY;  Surgeon: West Bali, MD;  [facility];  Service: Endoscopy;;  transverse colon polyp hs, rectal polyps times 2   SAVORY DILATION N/A 02/13/2018   Procedure: SAVORY DILATION;  Surgeon: West Bali, MD;  [facility];  Service: Endoscopy;  Laterality: N/A;   TENDON LENGTHENING Right 12/04/2021   Procedure: TENDON LENGTHENING;  Surgeon: Asencion Islam, DPM;  [facility];  Service: Podiatry;  Laterality: Right;   TRANSMETATARSAL AMPUTATION Right 12/04/2021   Procedure: TRANSMETATARSAL AMPUTATION;  Surgeon: Asencion Islam, DPM;  [facility];  Service: Podiatry;  Laterality: Right;   WOUND DEBRIDEMENT Left 09/04/2019   Procedure: DEBRIDEMENT WOUND WITH COMPLEX  REPAIR OF DEHISCENCE;  Surgeon: Park Liter, DPM;  [facility]: [facility];  Service: Podiatry;  Laterality: Left;  Leave patient on stretcher   WOUND DEBRIDEMENT Left 10/16/2019   Procedure: Left foot wound debridement and closure;  Surgeon: Park Liter, DPM;  Location: [facility];  Service: Podiatry;  Laterality: Left;   WOUND DEBRIDEMENT Left 12/18/2019   Procedure: LEFT FOOT DEBRIDEMENT WITH PARTIAL INCISION OF INFECTED BONE;  Surgeon: Park Liter, DPM;  [facility];  Service: Podiatry;  Laterality: Left;  LEFT FOOT DEBRIDEMENT WITH PARTIAL INCISION OF INFECTED BONE   WOUND DEBRIDEMENT Right 04/01/2021   Procedure: Right foot wound debridement and irrigation and closure, bone biopsy;  Surgeon: Park Liter, DPM;  [facility]: [facility];  Service: Podiatry;  Laterality: Right;   Social History:  reports that he has been smoking cigars. He has never used smokeless tobacco. He reports current alcohol use of about 50.0 standard drinks of alcohol per week. He reports that he does not currently use drugs after having used the following drugs: Marijuana.  Family / Support Systems Marital Status: Single Spouse/Significant Other: n/a Children: daughter, Crystal Other Supports:  Earley Abide, Relative/CNA, Better, Mother, Sister, Vernona Rieger and Corrie Dandy, sister Anticipated Caregiver: Patient plans to d/c home MOD I with PRN assist from friend, Earley Abide. If unable to meet MOD I goals patient to d/c home with daughter to provide 24/7. Ability/Limitations of Caregiver: supervision only Caregiver Availability: 24/7 Family Dynamics: support from family  Social History Preferred language: English Religion: Christian Cultural Background: Independent overall. Hx of amputee Education: HS Health Literacy - How often do you need to have someone help you when you read instructions, pamphlets, or other written material from your doctor or pharmacy?: Rarely Writes: Yes Employment Status: Disabled Date Retired/Disabled/Unemployed: n/a Marine scientist Issues: n/a Guardian/Conservator: n/a   Abuse/Neglect Abuse/Neglect Assessment Can Be Completed: Yes Physical Abuse: Denies Verbal Abuse: Denies Sexual Abuse: Denies Exploitation of patient/patient's resources: Denies Self-Neglect: Denies  Patient response to: Social Isolation - How often do you feel lonely or isolated from those around you?: Never  Emotional Status Pt's affect, behavior and adjustment status: Pleasant Recent Psychosocial Issues: Coping, hx of anxiety and severe depression Psychiatric History: hx of anxiety and severe depression Substance Abuse History: hx of alcohol abuse  Patient / Family Perceptions, Expectations & Goals Pt/Family understanding of illness & functional limitations: Yes Premorbid pt/family roles/activities: Independent Anticipated changes in roles/activities/participation: Patient plans to remain MOD I with PRN assist from family. If unable, patient willing to d/c home with his daughter to provide 24/7 Pt/family expectations/goals: MOD I-SUP  [employer]: None Premorbid Home Care/DME Agencies: Other (Comment) (RW, BSC, WC, SPC and SHower chair) Transportation  available at discharge: Leanord Asal or family Is the patient able to respond to transportation needs?: Yes In the past 12 months, has lack of transportation kept you from medical appointments or from getting medications?: No In the past 12 months, has lack of transportation kept you from meetings, work, or from getting things needed for daily living?: No Resource referrals recommended: Neuropsychology  Discharge Planning Living Arrangements: Alone Support Systems: Children Type of Residence: Private residence Insurance Resources: Media planner (specify) ([employer] Medicare) Financial Resources: SSD Financial Screen Referred: No Living Expenses: Own Money Management: Patient Does the patient have any problems obtaining your medications?: No Home Management: Independent Patient/Family Preliminary Plans: Plans to continue to manage Care Coordinator Barriers to Discharge: Lack of/limited family support, Insurance for SNF coverage, Decreased caregiver support Care Coordinator Anticipated Follow Up Needs: HH/OP Expected length of stay: 9-12 Days  Clinical Impression SW met with patient, introduced self and explained role. Patient previously MOD I and has a goal to meet MOD I goals and return home  with PRN assistance from his friend, Earley Abide who is a Lawyer. If patient unable to meet MOD I goals patient willing to d/c home where daughter will be able to provide 24/7 supervision. No steps to enter his home. Sw will follow up with patient on his progress to determine. No additional questions or concerns.  Andria Rhein 06/12/2023, 12:42 PM

## 2023-06-12 NOTE — Progress Notes (Signed)
Occupational Therapy Session Note  Patient Details  Name: NAYAN TRENARY MRN: [id number] Date of Birth: 1965-12-24  Today's Date: 06/12/2023 OT Individual Time: 0805-0900 & 1305-1420 OT Individual Time Calculation (min): 55 min & 75 min   Short Term Goals: Week 1:  OT Short Term Goal 1 (Week 1): Short term goals equal  LTG's  Skilled Therapeutic Interventions/Progress Updates:  Session 1 Skilled OT intervention completed with focus on rehab education, ADL retraining and functional transfers. Pt received upright in bed, agreeable to session. Unrated pain reported in Rt residual limb; pre-medicated. MD notified that current meds are reported "not helping." OT offered rest breaks, repositioningthroughout for pain reduction.  Pt initially with flat affect, verbalizing "why do I even need to be here." OT provided education on IPR, purpose and goals for DC. Did advise pt that he agreed to come to IPR and owes it to himself to increase endurance, balance and safety to reduce risk of falls and readmission which is pt's goal as well. Per chart review, pt attempting to leave AMA over weekend 2/2 wanting a shower but limited nursing staff available to do so as well as OT with typical skill set to complete first shower with pt, however pt declined requesting to try again later.  Donned shirt and shorts at bed level with set up A. Mod I transition to EOB. Able to donn Rt limb shrinker and limb guard independently. Pt attempted to [employer] on old L BKA, however OT educated that the shrinker is first, too large for the limb, but also intended for swapping as a clean shrinker for the dirty/worn one on R BKA. Able to donn Lt cloth sleeve x2, gel sleeve and prosthetic with set up A. Despite 2 sleeves, Lt prosthetic noted to have large gap; though no assist needed to donn. Pt reports he worked with Technical sales engineer for his prior BKA and Thayer Ohm had been assisting him with his prosthetic.  Set up A for continent void with  urinal (450 mL). MD present for rounds with pt focused on DC date; advised pt and MD that therapies would evaluate but pt needs assist and will need to be mod I therefore recommended at least a couple days for education at very least.  OT suggested transferring into w/c, with pt mildly impulsive, attempting to scoot into w/c with BLE leg rests donned. OT requested pt to trial RW for OT to assess higher level mobility per ambulatory goals at DC. With education about use of RW vs crutches (pt prefers these for proximal support), pt agreeable to demo transfer with RW. From elevated bed, pt needed min A to stand with BUE on RW. Min fading to CGA for standing balance, then min A stand pivot with hopping on Lt prosthetic but great control in the BUE noted > w/c. Discussed greater fall risk with crutches vs RW. Pt remained seated in w/c, with belt alarm on/activated, and with all needs in reach at end of session.  Session 2 Skilled OT intervention completed with focus on functional endurance and safety within a shower context. Pt received seated in w/c, agreeable to session. 7.5/10 pain reported in Rt residual limb; nurse notified ofr pain med request. OT offered rest breaks, repositioning and warm shower for pain reduction.  Pt agreeable to shower. Pt self-propelled in w/c > shower, with cues needed for effective/safe positioning to TTB. Laterally scooted using BUE/grab bar with close supervision with impressive BUE strength to lift bottom for full clearance during transfer. Cues  needed (though discussed earlier) for waterproof coverage of Rt incision needed prior to shower. Pt donned bag with supervision, min A needed to tape border.   At the seated level only, pt able to wash all parts with intermittent supervision. Pt with adequate safety awareness. CGA lateral scoot > w/c with less bottom clearance due to wet skin. Pt reports his w/c at home doesn't have flippable or removable arm rest.  Seated in w/c, pt  was set up A for deo/donning of shirt, and CGA for LB dressing via lateral leans, supervision for donning of shrinker on Rt limb with education provided on correct position/orientation of it, independent for limb guard donning. Set up A at sink for grooming. Education provided about shrinker care and hygiene with pt handwashing with soap and hanging to dry without assist.   Hanger present per OT order for 3X shrinkers for when ready to downsize for shaping. However pt not ready for at this time 2/2 pain. Transported pt dependently in w/c <> ADL bathroom for time.   Pt reports having shower chair TTB options at home but preference of shower chair. OT suggested that pt use TTB for safety with tub/shower transfer for safety over the gap of toilet > shower, as pt will have to ambulate into bathroom 2/2 environmental limitations at w/c level, and stand pivot from toilet > shower. Pt was able to demo CGA sit > stand and short ambulatory transfer > TTB using RW with good BUE control and landing on LLE prosthetic noted. Supervision for pivoting on bench <> shower. Min A needed to stand from bench but CGA to ambulate back to w/c with RW. Education provided on use of hand held shower head for ease of bathing, shower curtain tuck method to prevent water spillage, minimizing stands via lateral leans for pericare, use of grab bars/installation as well as effective safety strategies for exiting the shower to eliminate falls.   Back in room, pt remained seated in w/c, with belt alarm on/activated, and with all needs in reach at end of session.   Therapy Documentation Precautions:  Precautions Precautions: Fall Precaution Comments: chronic L BKA, has prosthetic, new R BKA Restrictions Weight Bearing Restrictions: Yes RLE Weight Bearing: Non weight bearing Other Position/Activity Restrictions: RLE limb protector    Therapy/Group: Individual Therapy  Melvyn Novas, MS, OTR/L  06/12/2023, 3:37 PM

## 2023-06-12 NOTE — Progress Notes (Signed)
PROGRESS NOTE   Subjective/Complaints: No new complaints this morning He asks when he can go home Going to have shower today Discussed labwork  ROS: as per HPI. Denies CP, SOB, abd pain, N/V/D/C, or any other complaints at this time.     Objective:   No results found. Recent Labs    06/12/23 0537  WBC 6.5  HGB 11.3*  HCT 33.7*  PLT 208   Recent Labs    06/10/23 0625 06/12/23 0537  NA 135 136  K 4.4 4.4  CL 100 101  CO2 26 31  GLUCOSE 131* 125*  BUN 24* 20  CREATININE 0.97 1.10  CALCIUM 9.2 9.0    Intake/Output Summary (Last 24 hours) at 06/12/2023 1001 Last data filed at 06/12/2023 0255 Gross per 24 hour  Intake 480 ml  Output 1900 ml  Net -1420 ml        Physical Exam: Vital Signs Blood pressure 124/76, pulse 66, temperature (!) 97.5 F (36.4 C), temperature source Oral, resp. rate 18, height 6\' 2"  (1.88 m), weight 98.9 kg, SpO2 97%.  Gen: no distress, normal appearing HEENT: oral mucosa pink and moist, NCAT Cardio: Reg rate Chest: normal effort, normal rate of breathing Abd: soft, non-distended Ext: no edema Psych: pleasant, normal affect Skin: intact Extremities: Left BKA Right BKA- shrinker sock and limb protector placed.  Psych: Pt's affect is irritable on second visit, visibly frustrated, but calm.  Skin: Clean and intact without signs of breakdown over exposed surfaces Neuro: Follows commands, normal speech and language  PRIOR EXAMS: Neuro: cranial nerves II through XII grossly intact Strength 5 out of 5 in bilateral upper extremities Strength 5 out of 5 in left hip flexion knee extension Able to lift right lower extremity to gravity Sensory exam normal for light touch and pain in all 4 limbs. No limb ataxia or cerebellar signs. No abnormal tone appreciated.  Musculoskeletal: No abnormal tone noted.  No joint swelling noted  Assessment/Plan: 1. Functional deficits which require 3+  hours per day of interdisciplinary therapy in a comprehensive inpatient rehab setting. Physiatrist is providing close team supervision and 24 hour management of active medical problems listed below. Physiatrist and rehab team continue to assess barriers to discharge/monitor patient progress toward functional and medical goals  Care Tool:  Bathing    Body parts bathed by patient: Right arm, Left arm, Chest, Abdomen, Front perineal area, Buttocks, Right upper leg, Left upper leg         Bathing assist       Upper Body Dressing/Undressing Upper body dressing   What is the patient wearing?: Pull over shirt    Upper body assist Assist Level: Set up assist    Lower Body Dressing/Undressing Lower body dressing      What is the patient wearing?: Pants     Lower body assist Assist for lower body dressing: Set up assist     Toileting Toileting    Toileting assist Assist for toileting: Contact Guard/Touching assist     Transfers Chair/bed transfer  Transfers assist     Chair/bed transfer assist level: Minimal Assistance - Patient > 75%     Locomotion Ambulation  Ambulation assist      Assist level: Minimal Assistance - Patient > 75% Assistive device: Walker-rolling Max distance: 20 ft   Walk 10 feet activity   Assist     Assist level: Minimal Assistance - Patient > 75% Assistive device: Walker-rolling   Walk 50 feet activity   Assist Walk 50 feet with 2 turns activity did not occur: Safety/medical concerns         Walk 150 feet activity   Assist Walk 150 feet activity did not occur: Safety/medical concerns         Walk 10 feet on uneven surface  activity   Assist Walk 10 feet on uneven surfaces activity did not occur: Safety/medical concerns         Wheelchair     Assist Is the patient using a wheelchair?: Yes Type of Wheelchair: Manual    Wheelchair assist level: Supervision/Verbal cueing Max wheelchair distance: 150 ft     Wheelchair 50 feet with 2 turns activity    Assist        Assist Level: Supervision/Verbal cueing   Wheelchair 150 feet activity     Assist      Assist Level: Supervision/Verbal cueing   Blood pressure 124/76, pulse 66, temperature (!) 97.5 F (36.4 C), temperature source Oral, resp. rate 18, height 6\' 2"  (1.88 m), weight 98.9 kg, SpO2 97%.  Medical Problem List and Plan: 1. 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 Wanted to leave AMA, advised that he has capacity to make that decision but explained why that might be unwise-- pt agreeable to wait until tomorrow to talk to Dr. Carlis Abbott about things.  -May benefit from seeing [facility] for adjustment or replacement to his L BKA socket as outpatient  -Messaged team to discuss discharge date   2.  Antithrombotics: -DVT/anticoagulation:  Pharmaceutical: Lovenox 40mg  QD             -antiplatelet therapy: none   3. Pain Management: history of chronic pain (morphine sulfate ER 30 mg tabs #60 filled 05/11/2023, oxycodone 30 mg #120 filled 05/10/2023 Dr. 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 Mood/Behavior/Sleep: History of anxiety and depression; LCSW to evaluate and provide emotional support             -stop amitriptyline today as starting linezolid -continue Xanax as  needed (Xanax 1 mg tabs #120 filled 05/10/2023)             -antipsychotic agents: n/a -06/10/23 poor sleep d/t not being on his amitriptyline, will try melatonin 5mg   at bedtime tonight and see how that helps-- 06/11/23 helped   5. Neuropsych/cognition: This patient is capable of making decisions on his own behalf.   6. Skin/Wound Care: Routine skin care checks             - Prevena wound VAC was removed 8/9 monitor incision   7. 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: CBGs QID; A1c = 5.6 on 04/27/2023 (home metformin 500 mg BID)             -continue SSI -8/10-11/24 CBGs variable, cont SSI for now but may benefit from restarting metformin-- defer to weekday team  8/12: metformin 500mg  daily  CBG (last 3)  Recent Labs    06/11/23 1642 06/11/23 2049 06/12/23 0557  GLUCAP 135* 198* 122*      11: GERD: continue Protonix 40mg  QD   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 CAD in native artery. Denies CP      LOS: 3 days A FACE TO FACE EVALUATION WAS PERFORMED  Eugene Diaz P Kareen Jefferys 06/12/2023, 10:01 AM

## 2023-06-13 MED ORDER — METFORMIN HCL 500 MG PO TABS
500.0000 mg | ORAL_TABLET | Freq: Two times a day (BID) | ORAL | Status: DC
  Administered 2023-06-13 – 2023-06-16 (×7): 500 mg via ORAL
  Filled 2023-06-13 (×7): qty 1

## 2023-06-13 NOTE — Progress Notes (Signed)
Physical Therapy Session Note  Patient Details  Name: Eugene Diaz MRN: [id number] Date of Birth: 1966/06/29  Today's Date: 06/13/2023 PT Individual Time: 1000-1100 PT Individual Time Calculation (min): 60 min   Short Term Goals: Week 1:  PT Short Term Goal 1 (Week 1): =LTGs d/t ELOS  Skilled Therapeutic Interventions/Progress Updates: Pt presents sitting in w/c and agreeable to therapy.  Pt wheeled to small gym w/ BUE and supervision.  Pt performed squat pivot to mat table w/ CGA.  Pt performed seated UE there ex including overhead press 1 x 15 w/ 4# weighted bar, 2 x 20 5.5#, 3 x 20 biceps curls, LAQ 3 x 20 B LES.  Pt transferred sit to supine w/ supervision and chest press 3 x 20 w/ 5.5# weighted bar, hip/knee flex/ext.  Pt tolerated prone lying for stretch to hip flexors, and then sidelying hip/knee flex/ext.  Pt donning L prosthesis and limb guard w/ set-up.  Pt returned to w/c w/ CGA and wheeled to room.  Pt remained sitting in w/c w/ all needs in reach.       Therapy Documentation Precautions:  Precautions Precautions: Fall Precaution Comments: chronic L BKA, has prosthetic, new R BKA Restrictions Weight Bearing Restrictions: Yes RLE Weight Bearing: Non weight bearing Other Position/Activity Restrictions: RLE limb protector General:   Vital Signs:   Pain:8/10 Pain Assessment Pain Scale: 0-10 Pain Score: 5     Therapy/Group: Individual Therapy  Lucio Edward 06/13/2023, 11:03 AM

## 2023-06-13 NOTE — Progress Notes (Signed)
PROGRESS NOTE   Subjective/Complaints: No new complaints this  Just wants to go home Discussed Friday d/c Already has a ramp  ROS: as per HPI. denies CP, SOB, abd pain, N/V/D/C, or any other complaints at this time.     Objective:   No results found. Recent Labs    06/12/23 0537  WBC 6.5  HGB 11.3*  HCT 33.7*  PLT 208   Recent Labs    06/12/23 0537  NA 136  K 4.4  CL 101  CO2 31  GLUCOSE 125*  BUN 20  CREATININE 1.10  CALCIUM 9.0    Intake/Output Summary (Last 24 hours) at 06/13/2023 0949 Last data filed at 06/13/2023 0840 Gross per 24 hour  Intake 595 ml  Output 850 ml  Net -255 ml        Physical Exam: Vital Signs Blood pressure 120/65, pulse 66, temperature 98.6 F (37 C), resp. rate 20, height 6\' 2"  (1.88 m), weight 98.9 kg, SpO2 95%.  Gen: no distress, normal appearing HEENT: oral mucosa pink and moist, NCAT Cardio: Reg rate Chest: normal effort, normal rate of breathing Abd: soft, non-distended Ext: no edema Psych: pleasant, normal affect Extremities: Left BKA Right BKA- shrinker sock and limb protector placed.  Psych: Pt's affect is irritable on second visit, visibly frustrated, but calm.  Skin: Clean and intact without signs of breakdown over exposed surfaces Neuro: Follows commands, normal speech and language  PRIOR EXAMS: Neuro: cranial nerves II through XII grossly intact Strength 5 out of 5 in bilateral upper extremities Strength 5 out of 5 in left hip flexion knee extension Able to lift right lower extremity to gravity Sensory exam normal for light touch and pain in all 4 limbs. No limb ataxia or cerebellar signs. No abnormal tone appreciated.  Musculoskeletal: No abnormal tone noted.  No joint swelling noted  Assessment/Plan: 1. Functional deficits which require 3+ hours per day of interdisciplinary therapy in a comprehensive inpatient rehab setting. Physiatrist is providing  close team supervision and 24 hour management of active medical problems listed below. Physiatrist and rehab team continue to assess barriers to discharge/monitor patient progress toward functional and medical goals  Care Tool:  Bathing    Body parts bathed by patient: Right arm, Left arm, Chest, Abdomen, Front perineal area, Buttocks, Right upper leg, Left upper leg, Face     Body parts n/a: Right lower leg, Left lower leg (bilateral BKA)   Bathing assist Assist Level: Set up assist     Upper Body Dressing/Undressing Upper body dressing   What is the patient wearing?: Pull over shirt    Upper body assist Assist Level: Independent    Lower Body Dressing/Undressing Lower body dressing      What is the patient wearing?: Pants, Ace wrap/stump shrinker     Lower body assist Assist for lower body dressing: Contact Guard/Touching assist     Toileting Toileting    Toileting assist Assist for toileting: Contact Guard/Touching assist     Transfers Chair/bed transfer  Transfers assist     Chair/bed transfer assist level: Minimal Assistance - Patient > 75%     Locomotion Ambulation   Ambulation assist  Assist level: Minimal Assistance - Patient > 75% Assistive device: Walker-rolling Max distance: 20 ft   Walk 10 feet activity   Assist     Assist level: Minimal Assistance - Patient > 75% Assistive device: Walker-rolling   Walk 50 feet activity   Assist Walk 50 feet with 2 turns activity did not occur: Safety/medical concerns         Walk 150 feet activity   Assist Walk 150 feet activity did not occur: Safety/medical concerns         Walk 10 feet on uneven surface  activity   Assist Walk 10 feet on uneven surfaces activity did not occur: Safety/medical concerns         Wheelchair     Assist Is the patient using a wheelchair?: Yes Type of Wheelchair: Manual    Wheelchair assist level: Supervision/Verbal cueing Max  wheelchair distance: 150 ft    Wheelchair 50 feet with 2 turns activity    Assist        Assist Level: Supervision/Verbal cueing   Wheelchair 150 feet activity     Assist      Assist Level: Supervision/Verbal cueing   Blood pressure 120/65, pulse 66, temperature 98.6 F (37 C), resp. rate 20, height 6\' 2"  (1.88 m), weight 98.9 kg, SpO2 95%.  Medical Problem List and Plan: 1. 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 Wanted to leave AMA, advised that he has capacity to make that decision but explained why that might be unwise-- pt agreeable to wait until tomorrow to talk to Dr. Carlis Abbott about things.  -May benefit from seeing [facility] for adjustment or replacement to his L BKA socket as outpatient  -Messaged team to discuss discharge date, discussed discharge date of Friday   2.  Antithrombotics: -DVT/anticoagulation:  Pharmaceutical: Lovenox 40mg  QD             -antiplatelet therapy: none   3. Pain Management: history of chronic pain (morphine sulfate ER 30 mg tabs #60 filled 05/11/2023, oxycodone 30 mg #120 filled 05/10/2023 Dr. 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 Anxiety:             -stop amitriptyline today as starting linezolid -continue Xanax as needed (Xanax 1 mg tabs #120 filled 05/10/2023)              -antipsychotic agents: n/a -06/10/23 poor sleep d/t not being on his amitriptyline, will try melatonin 5mg  at bedtime tonight and see how that helps-- 06/11/23 helped   5. Neuropsych/cognition: This patient is capable  of making decisions on his own behalf.   6. Skin/Wound Care: Routine skin care checks             - Prevena wound VAC was removed 8/9 monitor incision   7. Fluids/Electrolytes/Nutrition: Routine Is and Os and follow-up chemistries             -continue vitamin C, MVI, Juven, zinc, Ensure  -06/10/23 labs today stable, cont routine labs   8: Hypertension: monitor TID and prn             -continue amlodipine 10 mg -8/10-11/24 BP mostly controlled, cont regimen Vitals:   06/09/23 1831 06/10/23 0440 06/10/23 1225 06/10/23 1452  BP: (!) 142/75 128/72 126/72 122/76   06/10/23 1943 06/11/23 0400 06/11/23 1324 06/11/23 1936  BP: 137/85 115/72 122/69 126/71   06/12/23 0255 06/12/23 1500 06/12/23 1947 06/13/23 0545  BP: 124/76 126/70 122/74 120/65    9: Hyperlipidemia: continue rosuvastatin 20mg  QD   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: CBGs QID; A1c = 5.6 on 04/27/2023 (home metformin 500 mg BID)             -increase to home metformin 500mg  BID  CBG (last 3)  Recent Labs    06/11/23 1642 06/11/23 2049 06/12/23 0557  GLUCAP 135* 198* 122*      11: GERD: continue Protonix 40mg  QD   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 CAD in native artery. Denies CP      LOS: 4 days A FACE TO FACE EVALUATION WAS  PERFORMED  Drema Pry Brixton Schnapp 06/13/2023, 9:49 AM

## 2023-06-13 NOTE — Progress Notes (Signed)
Physical Therapy Session Note  Patient Details  Name: Eugene Diaz MRN: [id number] Date of Birth: 1966-07-12  Today's Date: 06/13/2023 PT Individual Time: 1345-1425 PT Individual Time Calculation (min): 40 min   Short Term Goals: Week 1:  PT Short Term Goal 1 (Week 1): =LTGs d/t ELOS  Skilled Therapeutic Interventions/Progress Updates:   Received pt sitting EOB, pt agreeable to PT treatment, and reported pain 7/10 in R residual limb - pt declined pain medication. Session with emphasis on functional mobility/transfers, generalized strengthening and endurance, D/C planning, DME, and gait training. Discussed with [facility]) pt's loose fitting socket and plan to get adjustments once discharged. Noted pt with 1 yellow and 1 green sock underneath liner and 2 yellow and 2 green socks on outside of liner. Educated pt that liner is supposed to go directly on skin to prevent prosthetic from slipping off but pt reports feeling more comfortable with socks on underneath liner despite education.   Pt confirmed that he has ramp to enter home with no steps. Pt reports he may stay with his step daughter upon D/C who has 4 STE with bilateral handrails. Also discussed using RW vs bilateral platform RW or crutches for greater ease with management but pt reported preferring bilateral platform. Required x 2 trials to stand with bilateral PFRW (but pt opting to grab onto regular RW handgrip instead of platform) and min A and performed stand<>pivot into WC with min A. In hallway, stood from [facility] with RW and CGA and ambulated 38ft with RW and CGA using a "hop to" pattern. Pt's gait pattern smoother and better controlled with RW vs bilateral PFRW and pt in agreement to continue practicing with it. Pt performed WC mobility 18ft using BUE and supervision back to room. Concluded session with pt sitting in WC, needs within reach, and seatbelt alarm on.   Therapy Documentation Precautions:  Precautions Precautions:  Fall Precaution Comments: chronic L BKA, has prosthetic, new R BKA Restrictions Weight Bearing Restrictions: Yes RLE Weight Bearing: Non weight bearing Other Position/Activity Restrictions: RLE limb protector  Therapy/Group: Individual Therapy Marlana Salvage Zaunegger Blima Rich PT, DPT 06/13/2023, 7:10 AM

## 2023-06-13 NOTE — Progress Notes (Signed)
Physical Therapy Session Note  Patient Details  Name: Eugene Diaz MRN: [id number] Date of Birth: 10/20/1966  Today's Date: 06/13/2023 PT Individual Time: 0800-0840 PT Individual Time Calculation (min): 40 min   Short Term Goals: Week 1:  PT Short Term Goal 1 (Week 1): =LTGs d/t ELOS  Skilled Therapeutic Interventions/Progress Updates:    Chart reviewed and pt agreeable to therapy. Pt received seated in bed with 8/10 c/o pain at surgical site. Session focused on functional transfers and amb practice to promote safe . Pt initiated session with independent donning of L prosthesis and R limb guard. Pt then transferred to [facility] using S + SPT. Pt then completed WC mobility of 363ft+ t/o session with S. Pt also completed blocked practice of 50-54ft amb with CGA + RW fading to S + RW. Session education emphasized safe sit to stand technique in setting of B BKA. At end of session, pt was left seated in [facility] with alarm engaged, nurse call bell and all needs in reach.     Therapy Documentation Precautions:  Precautions Precautions: Fall Precaution Comments: chronic L BKA, has prosthetic, new R BKA Restrictions Weight Bearing Restrictions: Yes RLE Weight Bearing: Non weight bearing Other Position/Activity Restrictions: RLE limb protector General:      Therapy/Group: Individual Therapy  Dionne Milo, PT, DPT 06/13/2023, 2:14 PM

## 2023-06-13 NOTE — Progress Notes (Signed)
Occupational Therapy Session Note  Patient Details  Name: Eugene Diaz MRN: [id number] Date of Birth: 02-25-1966  Today's Date: 06/13/2023 OT Individual Time: 7510-2585 OT Individual Time Calculation (min): 40 min    Short Term Goals: Week 1:  OT Short Term Goal 1 (Week 1): Short term goals equal  LTG's  Skilled Therapeutic Interventions/Progress Updates:  Skilled OT intervention completed with focus on DC planning, sit > stands with various DME. Pt received seated in w/c, agreeable to session. Unrated pain reported in Rt residual limb; pre-medicated. OT offered rest breaks, elevation and repositioning throughout for pain reduction.  MD present for rounds with MD and pt asking about DC. OT advised that pt requires min A for balance, but did confirm that pt has ramp to enter vs stairs, however pt continuing to be adamant about using his crutches like baseline. OT suggested he trial use of crutches if set on using them at home vs RW. Pt agreeable.  Pt was able to teach back his method for standing at baseline, with crutches in between his legs, and originally using RLE and LLE prosthetic to power up. Pt placed crutches in between thighs, stood with min A, required min A for dynamic balance on LLE prosthetic while he transferred crutches from midline to either side. Did demo impressive technique and motivation, however unsafe at this time, and especially if pt is trying to DC sooner than recommended. Pt eventually verbalized "this doesn't feel the same, idk that I like this" and "I never really took time to think about how my RLE wouldn't help me anymore."  Pt expressed that the proximal support of crutches vs distal with a RW is what makes him feel safer and more efficient. OT suggested option of putting bilateral platforms on the RW for greater proximal support. Pt was able to stand with CGA while pushing up with BUE on arm rests. Able to hop 1 step forward and backwards with min A for balance,  however pt reported feeling like the handles needed to be > forward. Will attempt to practice again after adjustments in later sessions; PT notified.  Pt remained seated in w/c, with belt alarm on/activated, and with all needs in reach at end of session.   Therapy Documentation Precautions:  Precautions Precautions: Fall Precaution Comments: chronic L BKA, has prosthetic, new R BKA Restrictions Weight Bearing Restrictions: Yes RLE Weight Bearing: Non weight bearing Other Position/Activity Restrictions: RLE limb protector    Therapy/Group: Individual Therapy  Melvyn Novas, MS, OTR/L  06/13/2023, 12:03 PM

## 2023-06-14 ENCOUNTER — Ambulatory Visit: Payer: Medicare HMO | Admitting: Podiatry

## 2023-06-14 NOTE — Progress Notes (Signed)
Occupational Therapy Session Note  Patient Details  Name: Eugene Diaz MRN: [id number] Date of Birth: [birth year]  Today's Date: 06/14/2023 OT Individual Time: 0930-1030 OT Individual Time Calculation (min): 60 min    Short Term Goals: Week 1:  OT Short Term Goal 1 (Week 1): Short term goals equal  LTG's  Skilled Therapeutic Interventions/Progress Updates:    1:1 Pt received in the w/c. Continued focus on sit to stands and functional ambulation with crutches (pt's choice verus a RW for home). Pt continues to require setting A when first comes into standing for balance while he get adjusted with his crutches under his arm pits. Pt ambulated into bathroom with contact guard to supervision to the tub bench (faced outward). Pt able to doff left prosthesis on his own. Pt showered mod I in seated position with residual limb (right ) covered with lateral leans for cleansing buttocks. Pt able to thread pants. Discussed not having to wear two black shrinkers and donned only one 3XL shrinker (a size down as his leg's swelling continues to go down. Also reminded him of the education about socks go over the liner and not under. Pt did end up donning the liner and then the socks and reported it fit better. Pt stood again with contact guard to get crutches in place and ambulated back to w/c. Discussed use of a tub bench at daughter's house (where he is d/cing to). Pt reports currently he had been sitting on a bucket. Pt left sitting up in the w/c with limb guard donned and safety pad donned.   Therapy Documentation Precautions:  Precautions Precautions: Fall Precaution Comments: chronic L BKA, has prosthetic, new R BKA Restrictions Weight Bearing Restrictions: Yes RLE Weight Bearing: Non weight bearing Other Position/Activity Restrictions: RLE limb protector General:   Vital Signs:   Pain: Pain Assessment Pain Scale: 0-10 Pain Score: 6  Pain Type: Acute pain Pain Location: Leg Pain Orientation:  Right Pain Descriptors / Indicators: Aching Pain Frequency: Constant Pain Onset: On-going Patients Stated Pain Goal: 3 Pain Intervention(s): Medication (See eMAR) ADL: ADL Equipment Provided:  (w/c) Eating: Set up Where Assessed-Eating: Edge of bed Grooming: Setup Where Assessed-Grooming: Edge of bed Upper Body Bathing: Setup Where Assessed-Upper Body Bathing: Edge of bed Lower Body Bathing: Setup (using grab bars for additional balance) Where Assessed-Lower Body Bathing: Edge of bed Upper Body Dressing: Setup Where Assessed-Upper Body Dressing: Edge of bed Lower Body Dressing: Setup Where Assessed-Lower Body Dressing: Edge of bed Toileting: Minimal assistance, Moderate assistance Where Assessed-Toileting: Other (Comment) (based on functional status during bathing of LB) Toilet Transfer: Contact guard, Minimal assistance (based on functional transfer from bed LOF to w/c) Toilet Transfer Method: Stand pivot Acupuncturist: Animator Transfer: Modified independent Tub/Shower Transfer Method: Stand pivot (based on transfer from bed LOF to w/c) Tub/Shower Equipment: Shower seat with back (patient indicated that he has a shower chair with a back and no grab bars.) Astronomer: Information systems manager with back Social worker   Exercises:   Other Treatments:     Therapy/Group: Individual Therapy  Roney Mans [facility] 06/14/2023, 9:57 AM

## 2023-06-14 NOTE — Progress Notes (Signed)
Patient ID: Eugene Diaz, male   DOB: 04/18/1966, 57 y.o.   MRN: [id number]  Team Conference Report to Patient/Family  Team Conference discussion was reviewed with the patient and caregiver, including goals, any changes in plan of care and target discharge date.  Patient and caregiver express understanding and are in agreement.  The patient has a target discharge date of  .  Sw met with patient to discuss team conference. Pt aware that d/c on Friday will cause his goal to be downgraded to supervision. Patient anticipates discharging home with his daughter on Friday with 4 steps to enter her home. Pt's sister in law will be present to pick patient up on Friday and transport him to his daughter's home between 9-11 AM. OP FU. No additional questions or concerns.   Andria Rhein 06/14/2023, 12:33 PM

## 2023-06-14 NOTE — Progress Notes (Signed)
Occupational Therapy Discharge Summary  Patient Details  Name: Eugene Diaz MRN: [id number] Date of Birth: August 11, 1966  Date of Discharge from OT service:June 15, 2023  Patient has met {NUMBERS 0-12:18577} of 2 long term goals due to improved activity tolerance, improved balance, functional use of  RIGHT upper, LEFT upper, and LEFT lower extremity, and improved coordination.  Patient to discharge at [facility] Assist to Supervision level.  Patient's care partner is independent to provide the necessary physical assistance at discharge. Pt did not agree to stay recommended LOS, therefore pt unable to DC at mod I level. Family did not attend education prior to DC.  Reasons goals not met: ***  Recommendation:  No follow up  Equipment: No equipment provided  Reasons for discharge: treatment goals met and discharge from hospital  Patient/family agrees with progress made and goals achieved: Yes  OT Discharge Precautions/Restrictions  Precautions Precautions: Fall Precaution Comments: old L BKA with prosthetic, new R BKA Restrictions Weight Bearing Restrictions: Yes RLE Weight Bearing: Non weight bearing Other Position/Activity Restrictions: RLE limb protector ADL ADL Equipment Provided:  (w/c) Eating: Independent Where Assessed-Eating: Wheelchair Grooming: Independent Where Assessed-Grooming: Wheelchair Upper Body Bathing: Independent Where Assessed-Upper Body Bathing: Shower Lower Body Bathing: Modified independent Where Assessed-Lower Body Bathing: Shower Upper Body Dressing: Independent Where Assessed-Upper Body Dressing: Wheelchair Lower Body Dressing: Modified independent Where Assessed-Lower Body Dressing: Sitting at sink Toileting: Minimal assistance Where Assessed-Toileting: Teacher, adult education: Close supervision Toilet Transfer Method: Surveyor, minerals: Grab bars, Raised toilet seat Tub/Shower Transfer: Minimal assistance Glass blower/designer Method: Ship broker: Other (comment), Transfer tub bench (RW) Film/video editor: Close supervision Film/video editor Method: Warden/ranger: Emergency planning/management officer, Grab bars Vision Baseline Vision/History: 1 Wears glasses Patient Visual Report: No change from baseline Vision Assessment?: No apparent visual deficits Perception  Perception: Within Functional Limits Praxis Praxis: [facility] Cognition   Sensation   Motor    Mobility     Trunk/Postural Assessment     Balance   Extremity/Trunk Assessment RUE Assessment RUE Assessment: Within Functional Limits LUE Assessment LUE Assessment: Within Functional Limits   Eugene Diaz 06/14/2023, 3:57 PM

## 2023-06-14 NOTE — Progress Notes (Addendum)
PROGRESS NOTE   Subjective/Complaints: No new complaints this morning Therapy notes reviewed, progressing very well Pain is present but under control  ROS: as per HPI. denies CP, SOB, abd pain, N/V/D/C, or any other complaints at this time.  +residual limb pain   Objective:   No results found. Recent Labs    06/12/23 0537  WBC 6.5  HGB 11.3*  HCT 33.7*  PLT 208   Recent Labs    06/12/23 0537  NA 136  K 4.4  CL 101  CO2 31  GLUCOSE 125*  BUN 20  CREATININE 1.10  CALCIUM 9.0    Intake/Output Summary (Last 24 hours) at 06/14/2023 0902 Last data filed at 06/14/2023 0509 Gross per 24 hour  Intake 240 ml  Output 675 ml  Net -435 ml        Physical Exam: Vital Signs Blood pressure 120/72, pulse 64, temperature 98 F (36.7 C), resp. rate 16, height 5' (1.524 m), weight 98.9 kg, SpO2 98%.  Gen: no distress, normal appearing HEENT: oral mucosa pink and moist, NCAT Cardio: Reg rate Chest: normal effort, normal rate of breathing Abd: soft, non-distended Ext: no edema Psych: pleasant, normal affect Extremities: Left BKA Right BKA- shrinker sock and limb protector placed.  Psych: Pt's affect is irritable on second visit, visibly frustrated, but calm.  Skin: Clean and intact without signs of breakdown over exposed surfaces Neuro: Follows commands, normal speech and language  PRIOR EXAMS: Neuro: cranial nerves II through XII grossly intact Strength 5 out of 5 in bilateral upper extremities Strength 5 out of 5 in left hip flexion knee extension Able to lift right lower extremity to gravity Sensory exam normal for light touch and pain in all 4 limbs. No limb ataxia or cerebellar signs. No abnormal tone appreciated.  Musculoskeletal: No abnormal tone noted.  No joint swelling noted  Assessment/Plan: 1. Functional deficits which require 3+ hours per day of interdisciplinary therapy in a comprehensive inpatient  rehab setting. Physiatrist is providing close team supervision and 24 hour management of active medical problems listed below. Physiatrist and rehab team continue to assess barriers to discharge/monitor patient progress toward functional and medical goals  Care Tool:  Bathing    Body parts bathed by patient: Right arm, Left arm, Chest, Abdomen, Front perineal area, Buttocks, Right upper leg, Left upper leg, Face     Body parts n/a: Right lower leg, Left lower leg (bilateral BKA)   Bathing assist Assist Level: Set up assist     Upper Body Dressing/Undressing Upper body dressing   What is the patient wearing?: Pull over shirt    Upper body assist Assist Level: Independent    Lower Body Dressing/Undressing Lower body dressing      What is the patient wearing?: Pants, Ace wrap/stump shrinker     Lower body assist Assist for lower body dressing: Contact Guard/Touching assist     Toileting Toileting    Toileting assist Assist for toileting: Contact Guard/Touching assist     Transfers Chair/bed transfer  Transfers assist     Chair/bed transfer assist level: Contact Guard/Touching assist     Locomotion Ambulation   Ambulation assist  Assist level: Minimal Assistance - Patient > 75% Assistive device: Walker-rolling Max distance: 20 ft   Walk 10 feet activity   Assist     Assist level: Minimal Assistance - Patient > 75% Assistive device: Walker-rolling   Walk 50 feet activity   Assist Walk 50 feet with 2 turns activity did not occur: Safety/medical concerns         Walk 150 feet activity   Assist Walk 150 feet activity did not occur: Safety/medical concerns         Walk 10 feet on uneven surface  activity   Assist Walk 10 feet on uneven surfaces activity did not occur: Safety/medical concerns         Wheelchair     Assist Is the patient using a wheelchair?: Yes Type of Wheelchair: Manual    Wheelchair assist level:  Supervision/Verbal cueing Max wheelchair distance: 150 ft    Wheelchair 50 feet with 2 turns activity    Assist        Assist Level: Supervision/Verbal cueing   Wheelchair 150 feet activity     Assist      Assist Level: Supervision/Verbal cueing   Blood pressure 120/72, pulse 64, temperature 98 F (36.7 C), resp. rate 16, height 5' (1.524 m), weight 98.9 kg, SpO2 98%.  Medical Problem List and Plan: 1. 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  Antithrombotics: -DVT/anticoagulation:  Pharmaceutical: Lovenox 40mg  QD             -antiplatelet therapy: none   3. Pain Management: history of chronic pain (morphine sulfate ER 30 mg tabs #60 filled 05/11/2023, oxycodone 30 mg #120 filled 05/10/2023 Dr. 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      4. Anxiety:             -stop amitriptyline today as starting linezolid -continue Xanax as needed (Xanax 1 mg tabs #120 filled 05/10/2023)             -antipsychotic agents: n/a -06/10/23 poor sleep d/t not being on his amitriptyline, will try melatonin 5mg  at bedtime tonight and see how that helps-- 06/11/23 helped   5. Neuropsych/cognition: This patient is capable of making decisions on his own behalf.    6. Skin/Wound Care: Routine skin care checks             - Prevena wound VAC was removed 8/9 monitor incision   7. 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: CBGs QID; A1c = 5.6 on 04/27/2023 (home metformin 500 mg BID)             -increase to home metformin 500mg  BID  -discussed avoiding added sugars in drinks  CBG (last 3)  Recent Labs    06/11/23 1642 06/11/23 2049 06/12/23 0557  GLUCAP 135* 198* 122*      11: GERD: continue Protonix 40mg  QD   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 CAD in native artery. Denies CP  15. Poor safety awareness: continue to educate regarding safe practices  16. Morbid obesity: provided dietary education regarding avoiding added sugars in the diet.        LOS: 5 days A FACE TO FACE EVALUATION WAS PERFORMED  Jenel Gierke P Tyla Burgner 06/14/2023, 9:02 AM

## 2023-06-14 NOTE — Plan of Care (Signed)
  Problem: Consults Goal: RH LIMB LOSS PATIENT EDUCATION Description: Description: See Patient Education module for eduction specifics. Outcome: Progressing   Problem: RH BOWEL ELIMINATION Goal: RH STG MANAGE BOWEL WITH ASSISTANCE Description: STG Manage Bowel with min Assistance. Outcome: Progressing Goal: RH STG MANAGE BOWEL W/MEDICATION W/ASSISTANCE Description: STG Manage Bowel with Medication with min Assistance. Outcome: Progressing   Problem: RH BLADDER ELIMINATION Goal: RH STG MANAGE BLADDER WITH ASSISTANCE Description: STG Manage Bladder With min Assistance Outcome: Progressing   Problem: RH SKIN INTEGRITY Goal: RH STG MAINTAIN SKIN INTEGRITY WITH ASSISTANCE Description: STG Maintain Skin Integrity With min Assistance. Outcome: Progressing Goal: RH STG ABLE TO PERFORM INCISION/WOUND CARE W/ASSISTANCE Description: STG Able To Perform Incision/Wound Care With min Assistance. Outcome: Progressing   Problem: RH SAFETY Goal: RH STG ADHERE TO SAFETY PRECAUTIONS W/ASSISTANCE/DEVICE Description: STG Adhere to Safety Precautions With min Assistance/Device. Outcome: Progressing Goal: RH STG DECREASED RISK OF FALL WITH ASSISTANCE Description: STG Decreased Risk of Fall With min Assistance. Outcome: Progressing   Problem: RH PAIN MANAGEMENT Goal: RH STG PAIN MANAGED AT OR BELOW PT'S PAIN GOAL Description: Pain will be managed 4 out of 10 on pain scale with PRN medications min assist  Outcome: Progressing

## 2023-06-14 NOTE — Progress Notes (Signed)
Occupational Therapy Session Note  Patient Details  Name: GEREMIAS FELTON MRN: [id number] Date of Birth: [birth year]  Today's Date: 06/14/2023 OT Individual Time: 2595-6387 OT Individual Time Calculation (min): 30 min    Short Term Goals: Week 1:  OT Short Term Goal 1 (Week 1): Short term goals equal  LTG's  Skilled Therapeutic Interventions/Progress Updates:    Pt resting in w/c upon arrival. OT intervention with focus on discharge planning and home safety recommendations. Pt will be initially discharging to daughter's home and then transition to his home. Pt has BSC. Pt reports his biggest concern is getting up steps into his daughter's home but he and PT had been "working" on that. Pt emotional during portion of session. Emotional support provided. Pt ready for discharge on Friday. Pt verbalized understanding of all home safety recommendations. Pt remained seated in w/c with all needs within reach.   Therapy Documentation Precautions:  Precautions Precautions: Fall Precaution Comments: chronic L BKA, has prosthetic, new R BKA Restrictions Weight Bearing Restrictions: Yes RLE Weight Bearing: Non weight bearing Other Position/Activity Restrictions: RLE limb protector Pain:  Pt denies pain this afternoon   Therapy/Group: Individual Therapy  Rich Brave 06/14/2023, 3:00 PM

## 2023-06-14 NOTE — Patient Care Conference (Signed)
Inpatient RehabilitationTeam Conference and Plan of Care Update Date: 06/14/2023   Time: 11:30 AM    Patient Name: Eugene Diaz      Medical Record Number: [id number]  Date of Birth: 06-Oct-1966 Sex: Male         Room/Bed: 4M05C/4M05C-01 Payor Info: Payor: [employer] MEDICARE / Plan: [employer] MEDICARE HMO / Product Type: *No Product type* /    Admit Date/Time:  06/09/2023  6:40 PM  Primary Diagnosis:  S/P BKA (below knee amputation), right [facility])  Hospital Problems: Principal Problem:   S/P BKA (below knee amputation), right [facility])    Expected Discharge Date: Expected Discharge Date: 06/16/23  Team Members Present: Physician leading conference: Dr. Sula Soda Social Worker Present: Lavera Guise, BSW Nurse Present: Chana Bode, RN;Atoya Andrew Marjo Bicker, RN PT Present: Raechel Chute, PT OT Present: Candee Furbish, OT PPS Coordinator present : Fae Pippin, SLP     Current Status/Progress Goal Weekly Team Focus  Bowel/Bladder   continent to bowel and bladder, LBM stated by pt 8/12 refuses PM senna  *** pt to remain continent   assess every shift and prn    Swallow/Nutrition/ Hydration      ***         ADL's   Set up A UB, CGA/min A LB at the stance level, min A toileting  *** Mod I   Barriers- pain, standing balance, home accessibility in/out of bathroom. Plan- mirror therapy, dynamic standing tasks with unilateral UE support, functional ambulation to promote accessibility to bathroom/shower    Mobility   bed mobility supervision, transfers with RW min A, gait 45ft with RW min A, WC mobility 147ft supervision, 2 steps with 1 handrail and shower chair min A  *** Mod I, supervision gait, min A steps  barriers: pain, improper fitting socket, decreased balance/coordination, lives alone    Communication      ***          Safety/Cognition/ Behavioral Observations     ***          Pain   chronic pain 7/10 controlled on current regimen  *** pain goal of <7/10    assess every shift and prn    Skin   Right stump- staples edges apoximated no draingage  *** wound to show signs of healing  assess every shift and prn      Discharge Planning:  Anticipates d/c home at MOD I level with PRN from his friend, Earley Abide who is a Lawyer.  If unable to [facility] supervision goals patient will d/c home with daughter.   Team Discussion: *** Patient on target to meet rehab goals: {IP REHAB YES/NO WITH [id number]}  *See Care Plan and progress notes for long and short-term goals.   Revisions to Treatment Plan:  ***  Teaching Needs: ***  Current Barriers to Discharge: {BARRIERS TO [id number]:84696}  Possible Resolutions to Barriers: ***     Medical Summary Current Status: morbid obesity, poor safety awareness, type 2 diabetes, poor prosthetic fit  Barriers to Discharge: Medical stability  Barriers to Discharge Comments: morbid obesity, poor safety awareness, type 2 diabetes, poor prosthetic fit Possible Resolutions to [employer] Focus: provided dietary eduation, restarted home metformin 500mg  BID, discontinued CBGs, needs outpatient follow-up for prosthetic adjustment   Continued Need for Acute Rehabilitation Level of Care: The patient requires daily medical management by a physician with specialized training in physical medicine and rehabilitation for the following reasons: Direction of a multidisciplinary physical rehabilitation program to maximize functional independence : Yes Medical management  of patient stability for increased activity during participation in an intensive rehabilitation regime.: Yes Analysis of laboratory values and/or radiology reports with any subsequent need for medication adjustment and/or medical intervention. : Yes   I attest that I was present, lead the team conference, and concur with the assessment and plan of the team.   Jearld Adjutant 06/14/2023, 3:12 PM

## 2023-06-14 NOTE — Progress Notes (Signed)
Physical Therapy Session Note  Patient Details  Name: Eugene Diaz MRN: [id number] Date of Birth: 04/09/1966  Today's Date: 06/14/2023 PT Individual Time: 0830-0925 PT Individual Time Calculation (min): 55 min   Short Term Goals: Week 1:  PT Short Term Goal 1 (Week 1): =LTGs d/t ELOS  Skilled Therapeutic Interventions/Progress Updates:   Received pt sitting in WC, pt agreeable to PT treatment, and reported pain 9/10 in R residual limb - RN present during session to administer pain medication. Session with emphasis on functional mobility/transfers, generalized strengthening and endurance, dynamic standing balance/coordination, and stair navigation. Noted pt placed 2 yellow and 2 green socks under liner and 1 green and 1 yellow on top of liner - again educated pt on concerns of prosthetic slipping off when placing too many socks under liner, but pt insisted on keeping them.  Pt performed WC mobility 18ft using BUE and supervision to main therapy gym. Pt reports he will most likely stay with his daughter who has 4 STE with bilateral handrails. Demonstrated shower chair method and technique for "hopping" up the steps backwards - recommended shower chair technique for safety, independence, and due to loose fitting prosthetic, however pt opted for "hopping" up backwards. Stood from [facility] with RW and min A and upon approaching stairs stated "I know what you're saying, but I'm telling you right now I'm not going to do that at home", explaining that he will hop up forwards - therefore utilized opportunity for planned failure.   Pt hopped up/down 2 6in steps with 2 handrails and heavy mod A. Pt with extreme difficulty clearing L foot and placing on/off step and placing weight through distal end of R residual limb despite stating that he was not. Pt stated "yeah, I don't think this is going to work" but upon sitting back down in [facility] and discussing, pt stated "I think I can do it". Upon further discussion, pt  reported he is going to "use his crutches anyway" for stair navigation, regardless of what therapist recommends. MD present for morning rounds while therapist retrieved crutches. Pt required multiple attempts and min A +2 to stand from [facility] with crutches and get to staircase (crutches placed in L hand, pushed up from [facility] with R hand, then brought crutches under each arm). Pt navigated 4 6in steps with crutches and bilateral handrails with heavy min A +2 with 3rd person on standby for safety - pt essentially hanging on crutches for stability. Recommended pt have daughter and sister in law (who plans on driving him to his daughter's house) assist/guard him with stair navigation.  Pt tranpsorted back to room in [facility] dependently and discussed recommendation for using RW as primary AD for mobility - pt acknowledging therapist's recommendations but did not agree. Concluded session with pt sitting in WC, needs within reach, and chair pad alarm on.   Therapy Documentation Precautions:  Precautions Precautions: Fall Precaution Comments: chronic L BKA, has prosthetic, new R BKA Restrictions Weight Bearing Restrictions: Yes RLE Weight Bearing: Non weight bearing Other Position/Activity Restrictions: RLE limb protector  Therapy/Group: Individual Therapy Marlana Salvage Zaunegger Blima Rich PT, DPT 06/14/2023, 6:48 AM

## 2023-06-14 NOTE — Progress Notes (Signed)
Occupational Therapy Session Note  Patient Details  Name: Eugene Diaz MRN: [id number] Date of Birth: 09/24/1966  Today's Date: 06/14/2023 OT Individual Time: 1050-1130 OT Individual Time Calculation (min): 40 min    Short Term Goals: Week 1:  OT Short Term Goal 1 (Week 1): Short term goals equal  LTG's  Skilled Therapeutic Interventions/Progress Updates:  Skilled OT intervention completed with focus on trial with sit > stands and ambulation with various AD. Pt received seated in w/c, agreeable to session. No pain reported.  Per previous OT, pt utilized crutches for mobility to enter shower. Per pt, he feels like the crutches are what he's used to, but also feels that crutches are a little sketchy, with request to trial them again.   Self-propelled in w/c <> gym with mod I. LLE prosthetic and Rt limb guard  already on. Able to position w/c in prep for transfer, no assist to manage leg rests, then supervision squat pivot > EOM. OT went to retrieve gait belt in prep for ambulation, and upon return pt was found to be standing in the middle of the gym with crutches, without anyone present. OT firmly explained and advised pt to return to ambulate back to EOM, as he is aware assist is needed for all mobility. Pt ambulated with crutches with CGA > EOM.  Had pt trial standing from EOM via crutches on Lt side, initially requiring CGA to power up, but increasing to min A for posterior LOB and flop back to mat. Tried again, with min A need to power up, and min A for balance while he transferred crutches to both sides. With w/c follow for safety, pt was able to ambulate with CGA without LOB. Stood from w/c, however pt using back of Lt prosthetic to boost himself up, with shifting of w/c behind him. Encouraged pt to trial RW for observing the difference in powering up, assist level and stature once up. Pt able to stand with close supervision using RW. Discussed that standing unassisted is the first step to  ambulate.. and if he needs assist to stand with crutches then they aren't applicable at this stage.  Pt verbalized he recognizes the RW helps him stand safer, but wants to "test out both" when home. Advised pt that current recommendation is to use RW per primary OT/PT, for fall risk as pt needs to be mod I at DC and refuses to stay longer in IPR for goal achievement. Pt receptive.  Back in room, pt remained seated in w/c, with chair alarm on/activated, and with all needs in reach at end of session.   Therapy Documentation Precautions:  Precautions Precautions: Fall Precaution Comments: chronic L BKA, has prosthetic, new R BKA Restrictions Weight Bearing Restrictions: Yes RLE Weight Bearing: Non weight bearing Other Position/Activity Restrictions: RLE limb protector    Therapy/Group: Individual Therapy  Melvyn Novas, MS, OTR/L  06/14/2023, 11:53 AM

## 2023-06-15 MED ORDER — AMLODIPINE BESYLATE 5 MG PO TABS
5.0000 mg | ORAL_TABLET | Freq: Every day | ORAL | Status: DC
  Administered 2023-06-16: 5 mg via ORAL
  Filled 2023-06-15: qty 1

## 2023-06-15 NOTE — Plan of Care (Signed)
  Problem: Consults Goal: RH LIMB LOSS PATIENT EDUCATION Description: Description: See Patient Education module for eduction specifics. Outcome: Progressing   Problem: RH BOWEL ELIMINATION Goal: RH STG MANAGE BOWEL WITH ASSISTANCE Description: STG Manage Bowel with min Assistance. Outcome: Progressing Goal: RH STG MANAGE BOWEL W/MEDICATION W/ASSISTANCE Description: STG Manage Bowel with Medication with min Assistance. Outcome: Progressing   Problem: RH BLADDER ELIMINATION Goal: RH STG MANAGE BLADDER WITH ASSISTANCE Description: STG Manage Bladder With min Assistance Outcome: Progressing   Problem: RH SKIN INTEGRITY Goal: RH STG MAINTAIN SKIN INTEGRITY WITH ASSISTANCE Description: STG Maintain Skin Integrity With min Assistance. Outcome: Progressing Goal: RH STG ABLE TO PERFORM INCISION/WOUND CARE W/ASSISTANCE Description: STG Able To Perform Incision/Wound Care With min Assistance. Outcome: Progressing   Problem: RH SAFETY Goal: RH STG ADHERE TO SAFETY PRECAUTIONS W/ASSISTANCE/DEVICE Description: STG Adhere to Safety Precautions With min Assistance/Device. Outcome: Progressing Goal: RH STG DECREASED RISK OF FALL WITH ASSISTANCE Description: STG Decreased Risk of Fall With min Assistance. Outcome: Progressing   Problem: RH PAIN MANAGEMENT Goal: RH STG PAIN MANAGED AT OR BELOW PT'S PAIN GOAL Description: Pain will be managed 4 out of 10 on pain scale with PRN medications min assist  Outcome: Progressing   Problem: RH KNOWLEDGE DEFICIT LIMB LOSS Goal: RH STG INCREASE KNOWLEDGE OF SELF CARE AFTER LIMB LOSS Description: Patient/caregiver will be able to manage medications and self care from nursing education, nursing handouts, and other resources independently  Outcome: Progressing   Problem: Education: Goal: Knowledge of the prescribed therapeutic regimen will improve Outcome: Progressing Goal: Ability to verbalize activity precautions or restrictions will improve Outcome:  Progressing Goal: Understanding of discharge needs will improve Outcome: Progressing   Problem: Activity: Goal: Ability to perform//tolerate increased activity and mobilize with assistive devices will improve Outcome: Progressing   Problem: Clinical Measurements: Goal: Postoperative complications will be avoided or minimized Outcome: Progressing   Problem: Self-Care: Goal: Ability to meet self-care needs will improve Outcome: Progressing   Problem: Self-Concept: Goal: Ability to maintain and perform role responsibilities to the fullest extent possible will improve Outcome: Progressing   Problem: Pain Management: Goal: Pain level will decrease with appropriate interventions Outcome: Progressing   Problem: Skin Integrity: Goal: Demonstration of wound healing without infection will improve Outcome: Progressing

## 2023-06-15 NOTE — Progress Notes (Signed)
Occupational Therapy Session Note  Patient Details  Name: DEVARIO TISON MRN: [id number] Date of Birth: 01-09-1966  Today's Date: 06/15/2023 OT Individual Time: 1345-1425 OT Individual Time Calculation (min): 40 min    Short Term Goals: Week 1:  OT Short Term Goal 1 (Week 1): Short term goals equal  LTG's  Skilled Therapeutic Interventions/Progress Updates:    OT intervention with focus on block practice of squat pivot and stand pivot transfers, in addition to review of home safety recommendations. All transfers with mod I. Pt verbalized understanding of all safety recommendations. Pt remained seated EOB with bed alarm activated.   Therapy Documentation Precautions:  Precautions Precautions: Fall Precaution Comments: old L BKA with prosthetic, new R BKA Restrictions Weight Bearing Restrictions: Yes RLE Weight Bearing: Non weight bearing Other Position/Activity Restrictions: RLE limb protector   Pain: Pt denies pain this afternoon   Therapy/Group: Individual Therapy  Rich Brave 06/15/2023, 2:33 PM

## 2023-06-15 NOTE — Progress Notes (Signed)
PROGRESS NOTE   Subjective/Complaints: No new complaints this morning Patient's chart reviewed- No issues reported overnight Vitals signs stable except for diastolic hypotension  ROS: as per HPI. denies CP, SOB, abd pain, N/V/D/C, or any other complaints at this time.  +residual limb pain   Objective:   No results found. No results for input(s): "WBC", "HGB", "HCT", "PLT" in the last 72 hours.  No results for input(s): "NA", "K", "CL", "CO2", "GLUCOSE", "BUN", "CREATININE", "CALCIUM" in the last 72 hours.   Intake/Output Summary (Last 24 hours) at 06/15/2023 0906 Last data filed at 06/15/2023 0726 Gross per 24 hour  Intake 840 ml  Output 2225 ml  Net -1385 ml        Physical Exam: Vital Signs Blood pressure (!) 114/54, pulse 65, temperature 98.4 F (36.9 C), temperature source Oral, resp. rate 17, height 5' (1.524 m), weight 98.9 kg, SpO2 98%.  Gen: no distress, normal appearing HEENT: oral mucosa pink and moist, NCAT Cardio: Reg rate Chest: normal effort, normal rate of breathing Abd: soft, non-distended Ext: no edema Psych: pleasant, normal affect Skin: intact Extremities: Left BKA Right BKA- shrinker sock and limb protector placed.  Psych: Pt's affect is irritable on second visit, visibly frustrated, but calm.  Skin: Clean and intact without signs of breakdown over exposed surfaces Neuro: Follows commands, normal speech and language  PRIOR EXAMS: Neuro: cranial nerves II through XII grossly intact Strength 5 out of 5 in bilateral upper extremities Strength 5 out of 5 in left hip flexion knee extension Able to lift right lower extremity to gravity Sensory exam normal for light touch and pain in all 4 limbs. No limb ataxia or cerebellar signs. No abnormal tone appreciated.  Musculoskeletal: No abnormal tone noted.  No joint swelling noted  Assessment/Plan: 1. Functional deficits which require 3+ hours per  day of interdisciplinary therapy in a comprehensive inpatient rehab setting. Physiatrist is providing close team supervision and 24 hour management of active medical problems listed below. Physiatrist and rehab team continue to assess barriers to discharge/monitor patient progress toward functional and medical goals  Care Tool:  Bathing    Body parts bathed by patient: Right arm, Left arm, Chest, Abdomen, Front perineal area, Buttocks, Right upper leg, Left upper leg, Face     Body parts n/a: Right lower leg, Left lower leg   Bathing assist Assist Level: Independent with assistive device     Upper Body Dressing/Undressing Upper body dressing   What is the patient wearing?: Pull over shirt    Upper body assist Assist Level: Independent    Lower Body Dressing/Undressing Lower body dressing      What is the patient wearing?: Pants     Lower body assist Assist for lower body dressing: Supervision/Verbal cueing     Toileting Toileting    Toileting assist Assist for toileting: Contact Guard/Touching assist     Transfers Chair/bed transfer  Transfers assist     Chair/bed transfer assist level: Contact Guard/Touching assist     Locomotion Ambulation   Ambulation assist      Assist level: Minimal Assistance - Patient > 75% Assistive device: Walker-rolling Max distance: 20 ft   Walk  10 feet activity   Assist     Assist level: Minimal Assistance - Patient > 75% Assistive device: Walker-rolling   Walk 50 feet activity   Assist Walk 50 feet with 2 turns activity did not occur: Safety/medical concerns         Walk 150 feet activity   Assist Walk 150 feet activity did not occur: Safety/medical concerns         Walk 10 feet on uneven surface  activity   Assist Walk 10 feet on uneven surfaces activity did not occur: Safety/medical concerns         Wheelchair     Assist Is the patient using a wheelchair?: Yes Type of Wheelchair:  Manual    Wheelchair assist level: Supervision/Verbal cueing Max wheelchair distance: 150 ft    Wheelchair 50 feet with 2 turns activity    Assist        Assist Level: Supervision/Verbal cueing   Wheelchair 150 feet activity     Assist      Assist Level: Supervision/Verbal cueing   Blood pressure (!) 114/54, pulse 65, temperature 98.4 F (36.9 C), temperature source Oral, resp. rate 17, height 5' (1.524 m), weight 98.9 kg, SpO2 98%.  Medical Problem List and Plan: 1. 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  Antithrombotics: -DVT/anticoagulation:  Pharmaceutical: Lovenox 40mg  QD             -antiplatelet therapy: none   3. Pain Management: history of chronic pain (morphine sulfate ER 30 mg tabs #60 filled 05/11/2023, oxycodone 30 mg #120 filled 05/10/2023 Dr. 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      4. Anxiety:             -stop amitriptyline today as starting linezolid -continue Xanax as needed (Xanax 1 mg tabs #120 filled 05/10/2023)             -antipsychotic agents: n/a -06/10/23 poor sleep d/t not being on his amitriptyline, will try melatonin 5mg  at bedtime tonight and see how that helps-- 06/11/23 helped   5.  Neuropsych/cognition: This patient is capable of making decisions on his own behalf.   6. Skin/Wound Care: Routine skin care checks             - Prevena wound VAC was removed 8/9 monitor incision   7. Fluids/Electrolytes/Nutrition: Routine Is and Os and follow-up chemistries             -continue vitamin C, MVI, Juven, zinc, Ensure  -  06/10/23 labs today stable, cont routine labs   8: Hypertension: decrease amlodipine to 5mg  daily.   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 CAD in native artery. Denies CP  15. Poor safety awareness: continue to educate regarding safe practices, discussed that we recommend he discharge home with his daughter  74. Morbid obesity: provided dietary education regarding avoiding added sugars in the diet.        LOS: 6 days A FACE TO FACE  EVALUATION WAS PERFORMED  Eugene Diaz 06/15/2023, 9:06 AM

## 2023-06-15 NOTE — Progress Notes (Addendum)
Physical Therapy Discharge Summary  Patient Details  Name: Eugene Diaz MRN: [id number] Date of Birth: Sep 23, 1966  Date of Discharge from PT service:June 15, 2023  Today's Date: 06/15/2023 PT Individual Time: 6063-0160 PT Individual Time Calculation (min): 70 min   Patient has met 3 of 8 long term goals due to improved activity tolerance, improved balance, improved postural control, increased strength, increased range of motion, and improved coordination.  Patient to discharge at a wheelchair level Supervision. Pt's family did not attend family education training prior to discharge, as pt requesting to discharge from rehab program ASAP. Have reinforced to pt recommendation to use RW vs crutches with mobility for safety and due to loose fitting prosthetic, however pt reports he will use crutches regardless of recommendation - pt verbalizes doing things "his own way".   Reasons goals not met: Goals not met due to resistance to education and request to discharge home ASAP.   Recommendation:  Patient will benefit from ongoing skilled PT services in outpatient setting to continue to advance safe functional mobility, address ongoing impairments in transfers, generalized strengthening and endurance, dynamic standing balance/coordination, gait training, and to minimize fall risk.  Equipment: No equipment provided - pt has RW, WC, and crutches  Reasons for discharge: discharge from hospital  Patient/family agrees with progress made and goals achieved: Yes  Today's Interventions: Received pt on toilet. Pt agreeable to PT treatment and reported pain 7/10 in R residual limb (premedicated). Session with emphasis on discharge planning, functional mobility/transfers, generalized strengthening and endurance, dynamic standing balance/coordination, simulated car transfers, and gait training. Instructed pt to tell therapist when he was finished toileting (as he insisted on keeping door closed for privacy)  but proceeded to transfer himself from toilet<>WC without notifying therapist. Micah Flesher through sensation, MMT, and pain interference questionnaire in preparation for discharge. Pt able to don/doff limb guard and prosthetic independently throughout session.   Pt performed WC mobility 164ft using BUE and mod I to ortho gym. Pt performed simulated car transfer with RW and close supervision, then ambulated 38ft on uneven surfaces (ramp) with RW and min A due to difficulty clearing L prosthetic. Pt then performed seated BUE strengthening on UBE at level 5 alternating 1 minute forwards and 1 minute backwards for 6 minutes. Pt able to stand with RW and supervision and pick up object from floor with CGA for balance. Pt then performed WC mobility additional 28ft using BUE and mod I to main therapy gym (pt able to don/doff legrests without assist throughout session). Pt transferred to/from mat via stand<>pivot without AD and supervision and performed the following exercises with emphasis on UE/core strength: -tricep extensions 3x10 -lateral trunk rotations 2x10 with  4.4lb medicine ball  -overhead chest press 2x10 with 4.4lb medicine ball  Pt reports multiple falls using crutches at home (getting stuck on carpet or sliding out from underneath him), but still states he plans on using crutches rather than RW at home, despite therapist's recommendation. Pt performed WC mobility 130ft using BUE and mod I back to room. Concluded session with pt sitting in WC, needs within reach, and chair pad alarm on.   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 Almost constantly Pain Interference with Therapy Activities: 1. Rarely or not at  all Pain Interference with Day-to-Day Activities: 1.  Rarely or not at all Cognition Overall Cognitive Status: Within Functional Limits for tasks assessed Arousal/Alertness: Awake/alert Orientation Level: Oriented X4 Memory: Appears intact Awareness: Impaired Problem Solving: Impaired Safety/Judgment: Impaired Comments: extremly poor insight into deficits Sensation Sensation Light Touch: Appears Intact Hot/Cold: Not tested Proprioception: Impaired by gross assessment Stereognosis: Not tested Additional Comments: numbness and phantom pain in R residual limb. Pt reports lingering pain in L residual limb but has "gotten used to it" Coordination Gross Motor Movements are Fluid and Coordinated: No Fine Motor Movements are Fluid and Coordinated: Yes Coordination and Movement Description: altered balance strategies due to new R BKA and old L BKA with improper fitting prosthetic Finger Nose Finger Test: [facility] bilaterally Heel Shin Test: unable to perform due to bilateral BKA Motor  Motor Motor: Abnormal postural alignment and control Motor - Skilled Clinical Observations: altered balance strategies due to new R BKA and old L BKA with improper fitting prosthetic  Mobility Bed Mobility Bed Mobility: Rolling Right;Rolling Left;Sit to Supine;Supine to Sit Rolling Right: Independent with assistive device Rolling Left: Independent with assistive device Supine to Sit: Independent with assistive device Sit to Supine: Independent with assistive device Transfers Transfers: Sit to Stand;Stand to Sit;Stand Pivot Transfers Sit to Stand: Supervision/Verbal cueing Stand to Sit: Supervision/Verbal cueing Stand Pivot Transfers: Supervision/Verbal cueing Transfer (Assistive device): Rolling walker Locomotion  Gait Ambulation: Yes Gait Assistance: Supervision/Verbal cueing Gait Distance (Feet): 50 Feet Assistive device: Rolling walker Gait Gait: Yes Gait Pattern: Impaired ("hop to" pattern) Gait  velocity: decreased Stairs / Additional Locomotion Stairs: Yes Stairs Assistance: 2 Helpers Stair Management Technique: Two rails;With crutches Number of Stairs: 4 Height of Stairs: 6 Ramp: Minimal Assistance - Patient >75% (RW) Pick up small object from the floor assist level: Contact Guard/Touching assist Pick up small object from the floor assistive device: RW and Chief Operating Officer Mobility: Yes Wheelchair Assistance: Independent with Scientist, research (life sciences): Both upper extremities Wheelchair Parts Management: Independent Distance: 164ft  Trunk/Postural Assessment  Cervical Assessment Cervical Assessment: Within Functional Limits Thoracic Assessment Thoracic Assessment: Within Functional Limits Lumbar Assessment Lumbar Assessment: Within Functional Limits Postural Control Postural Control: Deficits on evaluation Righting Reactions: delayed and slightly inadequate due to altered balance strategies from bilateral BKAs Protective Responses: delayed and slightly inadequate due to altered balance strategies from bilateral BKAs  Balance Balance Balance Assessed: Yes Static Sitting Balance Static Sitting - Balance Support: Feet supported;No upper extremity supported Static Sitting - Level of Assistance: 7: Independent Dynamic Sitting Balance Dynamic Sitting - Balance Support: Feet supported;No upper extremity supported Dynamic Sitting - Level of Assistance: 6: Modified independent (Device/Increase time) Static Standing Balance Static Standing - Balance Support: Bilateral upper extremity supported;During functional activity (RW) Static Standing - Level of Assistance: 5: Stand by assistance (supervision) Dynamic Standing Balance Dynamic Standing - Balance Support: Bilateral upper extremity supported;No upper extremity supported (RW) Dynamic Standing - Level of Assistance: 5: Stand by assistance (supervision) Dynamic Standing - Comments: for  transfers Extremity Assessment  RLE Assessment RLE Assessment: Exceptions to [facility] General Strength Comments: tested sitting in WC - slightly limited by pain RLE Strength Right Hip Flexion: 4/5 Right Hip ABduction: 4+/5 Right Hip ADduction: 4+/5 Right Knee Flexion: 4/5 Right Knee Extension: 4/5 LLE Assessment LLE Assessment: Exceptions to [facility] General Strength Comments: tested sitting in WC LLE Strength Left Hip Flexion: 4+/5 Left Hip ABduction: 4+/5 Left Hip ADduction: 4+/5 Left Knee Flexion: 4+/5 Left Knee Extension: 4+/5   Marlana Salvage Zaunegger Blima Rich PT, DPT 06/15/2023, 7:13 AM

## 2023-06-15 NOTE — Progress Notes (Signed)
Physical Therapy Session Note  Patient Details  Name: Eugene Diaz MRN: [id number] Date of Birth: [birth year]  Today's Date: 06/15/2023 PT Individual Time: 0805-0915 PT Individual Time Calculation (min): 70 min   Short Term Goals: Week 1:  PT Short Term Goal 1 (Week 1): =LTGs d/t ELOS  Skilled Therapeutic Interventions/Progress Updates: Pt presented in bed agreeable to therapy. Pt states unrated pain in residual limb. Pt already dressed therefore completed bed mobility mod I with use of bed features. With increased time pt donned socks, liner, and prosthetic mod I although pt did continue to don socks outside of liner not per recommendations. Pt  then ambulated 9ft to nsg station with RW and CGA fading to close supervision. Pt then propelled to main gym and transported remaining distance to day room. In dayroom pt completed ambulatory transfer to high/low mat and worked on residual limb ROM strengthening activities. Pt performed LAQ 2 x 15, hip flexion 2 x 15, transferred to supine and completed hip abd and SLR on residual limb 2 x 15. Pt was able to complete all tasks mod I. Pt then completed seated balance/endurance activity, completing ball taps with 4lb dowel 2 x 25. Intermittently provided conversation regarding time frame for new prosthetic as well as safety in home as pt requires time to adjust to "new normal". Pt then ambulated to 4W nsg station with RW and supervision. Pt propelled remaining distance back to room for general conditioning. Pt remained in w/c at end of session refusing belt alarm, but call bell within reach and needs met.      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

## 2023-06-16 ENCOUNTER — Other Ambulatory Visit: Payer: Self-pay

## 2023-06-16 DIAGNOSIS — M869 Osteomyelitis, unspecified: Secondary | ICD-10-CM

## 2023-06-16 MED ORDER — ACETAMINOPHEN 325 MG PO TABS: 325.0000 mg | ORAL_TABLET | ORAL | Status: AC | PRN

## 2023-06-16 MED ORDER — MELATONIN 5 MG PO TABS: 5.0000 mg | ORAL_TABLET | Freq: Every day | ORAL | 0 refills | Status: AC

## 2023-06-16 MED ORDER — ASCORBIC ACID 1000 MG PO TABS: 1000.0000 mg | ORAL_TABLET | Freq: Every day | ORAL | 0 refills | Status: AC

## 2023-06-16 MED ORDER — SENNOSIDES-DOCUSATE SODIUM 8.6-50 MG PO TABS: 1.0000 | ORAL_TABLET | Freq: Every day | ORAL | 0 refills | Status: AC

## 2023-06-16 MED ORDER — GABAPENTIN 100 MG PO CAPS: 100.0000 mg | ORAL_CAPSULE | Freq: Three times a day (TID) | ORAL | 0 refills | Status: AC

## 2023-06-16 MED ORDER — ADULT MULTIVITAMIN W/MINERALS CH: 1.0000 | ORAL_TABLET | Freq: Every day | ORAL | 0 refills | Status: DC

## 2023-06-16 MED ORDER — ZINC SULFATE 220 (50 ZN) MG PO CAPS: 220.0000 mg | ORAL_CAPSULE | Freq: Every day | ORAL | 0 refills | Status: AC

## 2023-06-16 MED ORDER — ADULT MULTIVITAMIN W/MINERALS CH: 1.0000 | ORAL_TABLET | Freq: Every day | ORAL | Status: AC

## 2023-06-16 MED ORDER — AMLODIPINE BESYLATE 5 MG PO TABS: 5.0000 mg | ORAL_TABLET | Freq: Every day | ORAL | 0 refills | Status: DC

## 2023-06-16 MED ORDER — METFORMIN HCL 500 MG PO TABS: 500.0000 mg | ORAL_TABLET | Freq: Two times a day (BID) | ORAL | 0 refills | Status: AC

## 2023-06-16 MED ORDER — METHOCARBAMOL 500 MG PO TABS: 500.0000 mg | ORAL_TABLET | Freq: Four times a day (QID) | ORAL | 0 refills | Status: AC | PRN

## 2023-06-16 NOTE — Progress Notes (Signed)
Discharge instructions provided by Wendi Maya, PA. Staff assisted patient off the unit. Patient discharged via private car with family member.    Tilden Dome, LPN

## 2023-06-16 NOTE — Consult Note (Signed)
   [facility] Inpatient Consult   06/16/2023  SONYA GIM 11/11/1965 [id number]  Referral request: High risk for unplanned readmission  Insurance: [facility] Medicare  Primary Care Provider:  Elfredia Nevins, MD    Followup:  RN Care Coordination for post rehab needs.  10:07 am Attempts to reach patient prior to transitioning home today was without success, patient not in room, and unable to reach by phone.  Plan: request post hospital follow up for readmission prevention measure and offer care coordination.  Charlesetta Shanks, RN BSN CCM [facility]  [phone] business mobile phone Toll free office [phone]  Fax number: [phone] [email or web address]

## 2023-06-16 NOTE — Progress Notes (Signed)
PROGRESS NOTE   Subjective/Complaints: No new complaints this morning Discussed protonix and that he is trying to control reflux with his diet, discussed using protonix as needed  ROS: as per HPI. denies CP, SOB, abd pain, N/V/D/C, or any other complaints at this time.  +residual limb pain, +reflux   Objective:   No results found. No results for input(s): "WBC", "HGB", "HCT", "PLT" in the last 72 hours.  No results for input(s): "NA", "K", "CL", "CO2", "GLUCOSE", "BUN", "CREATININE", "CALCIUM" in the last 72 hours.   Intake/Output Summary (Last 24 hours) at 06/16/2023 0937 Last data filed at 06/16/2023 0744 Gross per 24 hour  Intake 720 ml  Output 400 ml  Net 320 ml        Physical Exam: Vital Signs Blood pressure 138/73, pulse 66, temperature 97.7 F (36.5 C), resp. rate 18, height 5' (1.524 m), weight 98.9 kg, SpO2 99%.  Gen: no distress, normal appearing HEENT: oral mucosa pink and moist, NCAT Cardio: Reg rate Chest: normal effort, normal rate of breathing Abd: soft, non-distended Extremities: Left BKA Right BKA- shrinker sock and limb protector placed.  Psych: Pt's affect is irritable on second visit, visibly frustrated, but calm.  Skin: Clean and intact without signs of breakdown over exposed surfaces Neuro: Follows commands, normal speech and language  PRIOR EXAMS: Neuro: cranial nerves II through XII grossly intact Strength 5 out of 5 in bilateral upper extremities Strength 5 out of 5 in left hip flexion knee extension Able to lift right lower extremity to gravity Sensory exam normal for light touch and pain in all 4 limbs. No limb ataxia or cerebellar signs. No abnormal tone appreciated.  Musculoskeletal: No abnormal tone noted.  No joint swelling noted  Assessment/Plan: 1. Functional deficits which require 3+ hours per day of interdisciplinary therapy in a comprehensive inpatient rehab  setting. Physiatrist is providing close team supervision and 24 hour management of active medical problems listed below. Physiatrist and rehab team continue to assess barriers to discharge/monitor patient progress toward functional and medical goals  Care Tool:  Bathing    Body parts bathed by patient: Right arm, Left arm, Chest, Abdomen, Front perineal area, Buttocks, Right upper leg, Left upper leg, Face     Body parts n/a: Right lower leg, Left lower leg   Bathing assist Assist Level: Independent with assistive device     Upper Body Dressing/Undressing Upper body dressing   What is the patient wearing?: Pull over shirt    Upper body assist Assist Level: Independent    Lower Body Dressing/Undressing Lower body dressing      What is the patient wearing?: Pants     Lower body assist Assist for lower body dressing: Independent with assitive device     Toileting Toileting    Toileting assist Assist for toileting: Independent with assistive device     Transfers Chair/bed transfer  Transfers assist     Chair/bed transfer assist level: Supervision/Verbal cueing     Locomotion Ambulation   Ambulation assist      Assist level: Supervision/Verbal cueing Assistive device: Walker-rolling Max distance: 47ft   Walk 10 feet activity   Assist     Assist  level: Supervision/Verbal cueing Assistive device: Walker-rolling   Walk 50 feet activity   Assist Walk 50 feet with 2 turns activity did not occur: Safety/medical concerns  Assist level: Supervision/Verbal cueing Assistive device: Walker-rolling    Walk 150 feet activity   Assist Walk 150 feet activity did not occur: Safety/medical concerns (fatigue, altered balance stretegies)         Walk 10 feet on uneven surface  activity   Assist Walk 10 feet on uneven surfaces activity did not occur: Safety/medical concerns   Assist level: Minimal Assistance - Patient > 75% Assistive device:  Walker-rolling   Wheelchair     Assist Is the patient using a wheelchair?: Yes Type of Wheelchair: Manual    Wheelchair assist level: Independent Max wheelchair distance: 150 ft    Wheelchair 50 feet with 2 turns activity    Assist        Assist Level: Independent   Wheelchair 150 feet activity     Assist      Assist Level: Independent   Blood pressure 138/73, pulse 66, temperature 97.7 F (36.5 C), resp. rate 18, height 5' (1.524 m), weight 98.9 kg, SpO2 99%.  Medical Problem List and Plan: 1. 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  Antithrombotics: -DVT/anticoagulation:  Pharmaceutical: Lovenox 40mg  QD             -antiplatelet therapy: none   3. Pain Management: history of chronic pain (morphine sulfate ER 30 mg tabs #60 filled 05/11/2023, oxycodone 30 mg #120 filled 05/10/2023 Dr. Elfredia Nevins) -Tylenol as needed             -continue MS contin 15 mg BID             -Will start gabapentin 100mg  TID for phantom pain   Amitriptyline restarted as is on last dose of Linezolid today  -pain journal provided  Discussed Qutenza.      4. Anxiety:             -stop amitriptyline today as starting linezolid -continue Xanax as needed (Xanax 1 mg tabs #120 filled 05/10/2023)             -antipsychotic agents: n/a -06/10/23 poor sleep d/t not being on his amitriptyline, will try melatonin 5mg  at bedtime tonight and see how that helps-- 06/11/23  helped   5. Neuropsych/cognition: This patient is capable of making decisions on his own behalf.   6. Skin/Wound Care: Routine skin care checks             - Prevena wound VAC was removed 8/9 monitor incision   7. Fluids/Electrolytes/Nutrition: Routine Is and Os and follow-up chemistries             -continue vitamin C, MVI, Juven, zinc, Ensure  -06/10/23 labs today stable, cont routine labs   8: Hypertension: decrease amlodipine to 5mg  daily.   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 CAD in native artery. Denies CP  15. Poor safety awareness: continue to educate regarding safe practices, discussed that we recommend he discharge home with his daughter  45. Morbid obesity: provided dietary education regarding avoiding added sugars in the diet.    >30 minutes spent  in discharge of patient including review of medications and follow-up appointments, physical examination, and in answering all patient's questions       LOS: 7 days A FACE TO FACE EVALUATION WAS PERFORMED  Drema Pry Alison Kubicki 06/16/2023, 9:37 AM

## 2023-06-16 NOTE — Progress Notes (Signed)
Inpatient Rehabilitation Care Coordinator Discharge Note   Patient Details  Name: Eugene Diaz MRN: [id number] Date of Birth: May 24, 1966   Discharge location: Home  Length of Stay: 7 Days  Discharge activity level: Sup/Min  Home/community participation: Mother and daughter  Patient response VF:IEPPIR Literacy - How often do you need to have someone help you when you read instructions, pamphlets, or other written material from your doctor or pharmacy?: Sometimes  Patient response JJ:OACZYS Isolation - How often do you feel lonely or isolated from those around you?: Never  Services provided included: SW, Neuropsych, Pharmacy, TR, CM, RN, SLP, OT, PT, RD, MD  Financial Services:  Financial Services Utilized: Private Insurance [employer]  Choices offered to/list presented to: PT OT  Follow-up services arranged:  Outpatient    Outpatient Servicies: OP at [facility] DME : N/A    Patient response to transportation need: Is the patient able to respond to transportation needs?: Yes In the past 12 months, has lack of transportation kept you from medical appointments or from getting medications?: No In the past 12 months, has lack of transportation kept you from meetings, work, or from getting things needed for daily living?: No   Patient/Family verbalized understanding of follow-up arrangements:  Yes  Individual responsible for coordination of the follow-up plan: self or dauughter or friend Earley Abide.  Confirmed correct DME delivered: Andria Rhein 06/16/2023    Comments (or additional information):  Summary of Stay    Date/Time Discharge Planning CSW  06/13/23 1514 Anticipates d/c home at MOD I level with PRN from his friend, Earley Abide who is a CNA.  If unable to [facility] supervision goals patient will d/c home with daughter. CJB       Andria Rhein

## 2023-06-16 NOTE — Progress Notes (Signed)
Inpatient Rehabilitation Discharge Medication Review by a Pharmacist  A complete drug regimen review was completed for this patient to identify any potential clinically significant medication issues.  High Risk Drug Classes Is patient taking? Indication by Medication  Antipsychotic No   Anticoagulant No   Antibiotic No   Opioid Yes Morphine SR - chronic pain Oxycodone prn severe pain  Antiplatelet No   Hypoglycemics/insulin Yes Metformin - DM  Vasoactive Medication Yes Amlodipine - HTN  Chemotherapy No   Other Yes Rosuvastatin - HLD Gabapentin - pain Albuterol prn SOB Alprazolam, amitriptyline-mood Folic acid, thiamine - supplement  Methocarbamol - prn muscle spasms     Type of Medication Issue Identified Description of Issue Recommendation(s)  Drug Interaction(s) (clinically significant)     Duplicate Therapy     Allergy     No Medication Administration End Date     Incorrect Dose     Additional Drug Therapy Needed     Significant med changes from prior encounter (inform family/care partners about these prior to discharge).    Other       Clinically significant medication issues were identified that warrant physician communication and completion of prescribed/recommended actions by midnight of the next day:  No  Name of provider notified for urgent issues identified:   Provider Method of Notification:     Pharmacist comments: None  Time spent performing this drug regimen review (minutes):  20 minutes  Thank you Okey Regal, PharmD

## 2023-06-19 ENCOUNTER — Telehealth: Payer: Self-pay | Admitting: *Deleted

## 2023-06-19 NOTE — Progress Notes (Unsigned)
  Care Coordination  Outreach Note  06/19/2023 Name: Eugene Diaz MRN: [id number] DOB: 1966/10/25   Care Coordination Outreach Attempts: An unsuccessful telephone outreach was attempted today to offer the patient information about available care coordination services.  Follow Up Plan:  Additional outreach attempts will be made to offer the patient care coordination information and services.   Encounter Outcome:  No Answer  Burman Nieves, CCMA Care Coordination Care Guide Direct Dial: [phone]

## 2023-06-20 NOTE — Progress Notes (Unsigned)
  Care Coordination  Outreach Note  06/20/2023 Name: Eugene Diaz MRN: [id number] DOB: 1966-03-26   Care Coordination Outreach Attempts: A second unsuccessful outreach was attempted today to offer the patient with information about available care coordination services.  Follow Up Plan:  Additional outreach attempts will be made to offer the patient care coordination information and services.   Encounter Outcome:  No Answer  Burman Nieves, CCMA Care Coordination Care Guide Direct Dial: [phone]

## 2023-06-21 NOTE — Progress Notes (Signed)
  Care Coordination   Note   06/21/2023 Name: Eugene Diaz MRN: [id number] DOB: [birth year]  Eugene Diaz is a 57 y.o. year old male who sees Elfredia Nevins, MD for primary care. I reached out to [name] by phone today to offer care coordination services.  Mr. Dority was given information about Care Coordination services today including:   The Care Coordination services include support from the care team which includes your Nurse Coordinator, Clinical Social Worker, or Pharmacist.  The Care Coordination team is here to help remove barriers to the health concerns and goals most important to you. Care Coordination services are voluntary, and the patient may decline or stop services at any time by request to their care team member.   Care Coordination Consent Status: Patient did not agree to participate in care coordination services at this time.  Follow up plan:  none indicated - pt appreciative but says he is already working with nurse   Encounter Outcome:  Pt. Refused  Burman Nieves, CCMA Care Coordination Care Guide Direct Dial: [phone]

## 2023-06-23 DIAGNOSIS — E1140 Type 2 diabetes mellitus with diabetic neuropathy, unspecified: Secondary | ICD-10-CM | POA: Diagnosis not present

## 2023-06-23 DIAGNOSIS — I10 Essential (primary) hypertension: Secondary | ICD-10-CM | POA: Diagnosis not present

## 2023-06-23 DIAGNOSIS — G629 Polyneuropathy, unspecified: Secondary | ICD-10-CM | POA: Diagnosis not present

## 2023-06-23 DIAGNOSIS — E1152 Type 2 diabetes mellitus with diabetic peripheral angiopathy with gangrene: Secondary | ICD-10-CM | POA: Diagnosis not present

## 2023-06-23 DIAGNOSIS — M86371 Chronic multifocal osteomyelitis, right ankle and foot: Secondary | ICD-10-CM | POA: Diagnosis not present

## 2023-06-23 DIAGNOSIS — G894 Chronic pain syndrome: Secondary | ICD-10-CM | POA: Diagnosis not present

## 2023-06-23 DIAGNOSIS — G546 Phantom limb syndrome with pain: Secondary | ICD-10-CM | POA: Diagnosis not present

## 2023-06-23 DIAGNOSIS — M150 Primary generalized (osteo)arthritis: Secondary | ICD-10-CM | POA: Diagnosis not present

## 2023-06-23 DIAGNOSIS — Z6827 Body mass index (BMI) 27.0-27.9, adult: Secondary | ICD-10-CM | POA: Diagnosis not present

## 2023-06-26 ENCOUNTER — Ambulatory Visit: Payer: Medicare HMO | Admitting: Infectious Disease

## 2023-06-28 ENCOUNTER — Encounter: Payer: Medicare HMO | Admitting: Family

## 2023-06-28 ENCOUNTER — Telehealth: Payer: Self-pay

## 2023-06-28 NOTE — Telephone Encounter (Signed)
This pt is s/p a BKA on 83/2024 and d/c from the hospital on 06/16/2023. He was a NS to his office visit today. Can you please reach out and sch for follow up next week? Thanks!

## 2023-07-12 ENCOUNTER — Encounter: Payer: Self-pay | Admitting: Family

## 2023-07-12 ENCOUNTER — Ambulatory Visit (INDEPENDENT_AMBULATORY_CARE_PROVIDER_SITE_OTHER): Payer: Medicare HMO | Admitting: Family

## 2023-07-12 DIAGNOSIS — Z89512 Acquired absence of left leg below knee: Secondary | ICD-10-CM

## 2023-07-12 DIAGNOSIS — Z89511 Acquired absence of right leg below knee: Secondary | ICD-10-CM

## 2023-07-12 NOTE — Progress Notes (Signed)
Post-Op Visit Note   Patient: Eugene Diaz           Date of Birth: 09/22/1966           MRN: [id number] Visit Date: 07/12/2023 PCP: Elfredia Nevins, MD  Chief Complaint:  Chief Complaint  Patient presents with   Right Leg - Routine Post Op    06/03/23 right BKA    HPI:  HPI Patient is a 57 year old gentleman seen status post right below-knee amputation August 3.  He is also status post remote below-knee amputation on the left.  His current socket is ill fitting and broken down he has difficulty ambulating safely due to balance issues from his ilL fitting socket  Patient is a new right transtibial  amputee.  Patient's current comorbidities are not expected to impact the ability to function with the prescribed prosthesis. Patient verbally communicates a strong desire to use a prosthesis. Patient currently requires mobility aids to ambulate without a prosthesis.  Expects not to use mobility aids with a new prosthesis.  Patient is a K3 level ambulator that spends a lot of time walking around on uneven terrain over obstacles, up and down stairs, and ambulates with a variable cadence.  Patient is an existing left transtibial  amputee.  Patient's current comorbidities are not expected to impact the ability to function with the prescribed prosthesis. Patient verbally communicates a strong desire to use a prosthesis. Patient currently requires mobility aids to ambulate without a prosthesis.  Expects not to use mobility aids with a new prosthesis.        MH working at [employer] On examination of the right residual limb this is well consolidated well-healed there are staples in place there is moderate edema no erythema  Visit Diagnoses: No diagnosis found.  Plan: Staples harvested without incident.  Discussed that he will continue using his shrinker call [facility] for prosthesis set up order for prostheses for left and right below-knee amputation faxed to  [facility] today.  Follow-Up Instructions: Return in about 2 months (around 09/11/2023).   Imaging: No results found.  Orders:  No orders of the defined types were placed in this encounter.  No orders of the defined types were placed in this encounter.    PMFS History: Patient Active Problem List   Diagnosis Date Noted   S/P BKA (below knee amputation), right (HCC) 06/09/2023   Bacteremia 05/31/2023   Medication management 05/31/2023   Obesity (BMI 30-39.9) 05/30/2023   Acute osteomyelitis of calcaneum (HCC) 05/30/2023   Sepsis due to enterococcus and Proteus bacteremia due to osteomyelitis of the right foot (HCC) 05/29/2023   Chronic pain 05/29/2023   Osteomyelitis (HCC) 04/27/2023   GAD (generalized anxiety disorder) 10/13/2022   Cellulitis of foot, right 10/12/2022   Vaccine counseling 08/29/2022   Syncope, near 06/20/2022   Syncope and collapse 06/07/2022   CAD in native artery 06/07/2022   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) 05/23/2022   LUE weakness 05/22/2022   Wound infection 05/20/2022   Alcohol use disorder 03/03/2022   Chest pain- w atypical and typical features 03/02/2022   Tobacco abuse 03/02/2022   Essential hypertension 03/02/2022   Gangrene right third toe associated with type 2 diabetes mellitus (HCC), likely osteomyelitis 12/03/2021   Type 2 diabetes mellitus (HCC) 12/03/2021   Right foot infection    Gangrene (HCC)    Osteomyelitis of great toe of right foot (HCC) 08/18/2021   Suicidal ideation 06/09/2021   Diabetic wet  gangrene of the foot (HCC)    Unilateral complete BKA, left, 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   Acute blood loss anemia 01/04/2020   Asthma    Depression with anxiety    Hyperglycemia 12/17/2019   Diabetic foot ulcer (HCC)    Anemia, chronic disease 11/10/2019   Marijuana use 11/10/2019   Wound dehiscence 08/31/2019   Hyponatremia 08/14/2019   Normocytic anemia 08/14/2019   B12 deficiency 08/14/2019   Subacute  osteomyelitis, right ankle and foot (HCC)    GERD (gastroesophageal reflux disease) 12/19/2018   Hepatic steatosis 06/18/2018   Gastritis, erosive    Dysphagia, idiopathic 12/27/2017   Rectal bleeding 12/27/2017   Intention tremor 06/03/2016   Left knee pain 12/04/2015   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   Diabetic neuropathy (HCC) 10/08/2014   Chest pain, rule out acute myocardial infarction 03/10/2014   Chronic alcohol abuse 03/10/2014   Type 2 diabetes mellitus with peripheral neuropathy (HCC)    HLD (hyperlipidemia)    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) 05/23/2022   Depression    Diabetic neuropathy (HCC)    Edema of both lower extremities    Essential tremor    neurologist-- dr patel--- due to alcohol abuse   GERD (gastroesophageal reflux disease)    History of cellulitis 2020   left lower leg;   recurrent 03-25-2020   History of esophageal stricture    s/p  dilatation 02-13-2018   History of osteomyelitis 2020   left great toe     Hypercholesterolemia    Hypertension    followed by pcp  (nuclear stress test 03-11-2014 low risk w/ no ischemia, ef 65%   Normocytic anemia    followed by pcp   (03-18-2020 had transfusion's 02/ 2021)   Osteomyelitis of great toe of right foot (HCC) 08/18/2021   Peripheral vascular disease (HCC)    Severe depression (HCC) 06/09/2021   Status post incision and drainage followed by Dr Samuella Cota and pcp   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   (03-17-2020  pt states most of incision is healed with exception an area that is still draining,  daily dressing change),  foot is red but not warm to the touch and has swelling but has improved)   Suicidal ideation 06/09/2021   Syncope and collapse 06/07/2022   Syncope,  near 06/20/2022   Thrombocytopenia (HCC)    chronic   Type 2 diabetes mellitus (HCC)    followed by pcp---    (03-18-2020  pt stated does not check blood sugar)   Vaccine counseling 08/29/2022    Family History  Problem Relation Age of Onset   Heart attack Mother        Living, 93   Cancer Father        Deceased   Healthy Brother    Healthy Sister    Colon cancer Neg Hx    Colon polyps Neg Hx     Past Surgical History:  Procedure Laterality Date   AMPUTATION Left 10/16/2019   Procedure: Left partial second ray resection; placement of antibiotic beads;  Surgeon: Park Liter, DPM;  Location: [facility];  Service: Podiatry;  Laterality: Left;   AMPUTATION Left 11/13/2019   Procedure:  Left Midfoot Amputation - Transmetatarsal vs. Lisfranc; Placement antibiotic beads;  Surgeon: Park Liter, DPM;  Location: [facility];  Service: Podiatry;  Laterality: Left;   AMPUTATION Left 12/21/2019   Procedure: Chopart Amputation left foot;  Surgeon: Park Liter, DPM;  Location: [facility];  Service: Podiatry;  Laterality: Left;   AMPUTATION Left 06/24/2020   Procedure: LEFT BELOW KNEE AMPUTATION;  Surgeon: Nadara Mustard, MD;  Location: [facility] OR;  Service: Orthopedics;  Laterality: Left;   AMPUTATION Right 03/29/2021   Procedure: AMPUTATION RAY;  Surgeon: Park Liter, DPM;  Location: [facility];  Service: Podiatry;  Laterality: Right;   AMPUTATION Right 04/28/2023   Procedure: RIGHT PARTIAL FOOT AMPUTATION;  Surgeon: Felecia Shelling, DPM;  Location: [facility];  Service: Podiatry;  Laterality: Right;   AMPUTATION Right 06/03/2023   Procedure: RIGHT BELOW KNEE AMPUTATION;  Surgeon: Nadara Mustard, MD;  Location: [facility] OR;  Service: Orthopedics;  Laterality: Right;   AMPUTATION TOE Left 08/14/2019   Procedure: AMPUTATION GREAT TOE;  Surgeon: Park Liter, DPM;  Location: [facility];  Service: Podiatry;  Laterality: Left;   APPLICATION OF WOUND VAC Left 12/21/2019   Procedure: Application Of Wound Vac;  Surgeon: Park Liter, DPM;  [facility]: [facility];  Service: Podiatry;  Laterality: Left;   APPLICATION OF WOUND VAC Right 06/03/2023   Procedure: APPLICATION OF WOUND VAC;  Surgeon: Nadara Mustard, MD;  [facility]: [facility];  Service: Orthopedics;  Laterality: Right;   BIOPSY  02/13/2018   Procedure: BIOPSY;  Surgeon: West Bali, MD;  [facility];  Service: Endoscopy;;  transverse colon biopsy, gastric biopsy   BONE BIOPSY Right 04/28/2023   Procedure: BONE BIOPSY;  Surgeon: Felecia Shelling, DPM;  Location: [facility];  Service: Podiatry;  Laterality: Right;   COLONOSCOPY WITH PROPOFOL N/A 02/13/2018   Procedure: COLONOSCOPY WITH PROPOFOL;  Surgeon: West Bali, MD;  [facility]: [facility];  Service: Endoscopy;  Laterality: N/A;  11:15am   ESOPHAGOGASTRODUODENOSCOPY (EGD) WITH PROPOFOL N/A 02/13/2018   Procedure: ESOPHAGOGASTRODUODENOSCOPY (EGD) WITH PROPOFOL;  Surgeon: West Bali, MD;  [facility];  Service: Endoscopy;  Laterality: N/A;   I & D EXTREMITY Left 07/16/2019   Procedure: Insicion  AND DEBRIDEMENT EXTREMITY;  Surgeon: Park Liter, DPM;  [facility];  Service: Podiatry;  Laterality: Left;   I & D EXTREMITY Left 06/22/2020   Procedure: IRRIGATION AND DEBRIDEMENT EXTREMITY, left foot and ankle;  Surgeon: Park Liter, DPM;  [facility]: [facility];  Service: Podiatry;  Laterality: Left;   I & D EXTREMITY Right 03/29/2021   Procedure: IRRIGATION AND DEBRIDEMENT EXTREMITY;  Surgeon: Park Liter, DPM;  [facility]: [facility];  Service: Podiatry;  Laterality: Right;   INCISION AND DRAINAGE OF WOUND Left 10/13/2019   Procedure: IRRIGATION AND DEBRIDEMENT WOUND OF LEFT FOOT AND FIRST METATARSAL RESECTION;  Surgeon: Park Liter, DPM;  Location: [facility];  Service: Podiatry;  Laterality: Left;   IRRIGATION AND DEBRIDEMENT FOOT Left 11/11/2019   Procedure: Left Foot Wound Irrigation and Debridement;  Surgeon: Park Liter, DPM;  Location: [facility];  Service: Podiatry;  Laterality: Left;    Left arm     Left arm repair (tendon and artery)   PERCUTANEOUS PINNING  07/16/2019   Procedure: Open Reduction Percutaneous Pinning Extremity;  Surgeon: Park Liter, DPM;  [facility]: [facility];  Service: Podiatry;;   PILONIDAL CYST EXCISION N/A 08/08/2014   Procedure: CYST EXCISION PILONIDAL  EXTENSIVE;  Surgeon: Dalia Heading, MD;  Location: [facility];  Service: General;  Laterality: N/A;   POLYPECTOMY  02/13/2018   Procedure: POLYPECTOMY;  Surgeon: West Bali, MD;  [facility];  Service: Endoscopy;;  transverse colon polyp hs, rectal polyps times 2   SAVORY DILATION N/A 02/13/2018   Procedure: SAVORY DILATION;  Surgeon: West Bali, MD;  [facility]: [facility];  Service: Endoscopy;  Laterality: N/A;   TENDON LENGTHENING Right 12/04/2021   Procedure: TENDON LENGTHENING;  Surgeon: Asencion Islam, DPM;  [facility];  Service: Podiatry;  Laterality: Right;   TRANSMETATARSAL AMPUTATION Right 12/04/2021   Procedure: TRANSMETATARSAL AMPUTATION;  Surgeon: Asencion Islam, DPM;  Location[facility];  Service: Podiatry;  Laterality: Right;   WOUND DEBRIDEMENT Left 09/04/2019   Procedure: DEBRIDEMENT WOUND WITH COMPLEX  REPAIR OF DEHISCENCE;  Surgeon: Park Liter, DPM;  Location: [facility];  Service: Podiatry;  Laterality: Left;  Leave patient on stretcher   WOUND DEBRIDEMENT Left 10/16/2019   Procedure: Left foot wound debridement and closure;  Surgeon: Park Liter, DPM;  Location: [facility];  Service: Podiatry;  Laterality: Left;   WOUND DEBRIDEMENT Left 12/18/2019   Procedure: LEFT FOOT DEBRIDEMENT WITH PARTIAL INCISION OF INFECTED BONE;  Surgeon: Park Liter, DPM;  Location: [facility];  Service: Podiatry;  Laterality: Left;  LEFT FOOT DEBRIDEMENT WITH PARTIAL INCISION OF INFECTED BONE   WOUND DEBRIDEMENT Right 04/01/2021   Procedure: Right foot wound debridement and irrigation and closure, bone biopsy;  Surgeon: Park Liter, DPM;  Location: [facility];  Service:  Podiatry;  Laterality: Right;   Social History   Occupational History   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  Tobacco Use   Smoking status: Every Day    Types: Cigars   Smokeless tobacco: Never   Tobacco comments:    Quit smoking cigarettes; now smokes cigars, wants to quit  Vaping Use   Vaping status: Never Used  Substance and Sexual Activity   Alcohol use: Yes    Alcohol/week: 50.0 standard drinks of alcohol    Types: 50 Standard drinks or equivalent per week    Comment: Drinks 6 beers, liquor 4-6oz (both daily) x 35 years; now states he "drinks a few beers and let it go"   Drug use: Not Currently    Types: Marijuana   Sexual activity: Yes    Birth control/protection: None

## 2023-07-25 ENCOUNTER — Encounter: Payer: Medicare HMO | Attending: Physical Medicine and Rehabilitation | Admitting: Physical Medicine and Rehabilitation

## 2023-08-08 DIAGNOSIS — G629 Polyneuropathy, unspecified: Secondary | ICD-10-CM | POA: Diagnosis not present

## 2023-08-08 DIAGNOSIS — G546 Phantom limb syndrome with pain: Secondary | ICD-10-CM | POA: Diagnosis not present

## 2023-08-08 DIAGNOSIS — E1152 Type 2 diabetes mellitus with diabetic peripheral angiopathy with gangrene: Secondary | ICD-10-CM | POA: Diagnosis not present

## 2023-08-08 DIAGNOSIS — G894 Chronic pain syndrome: Secondary | ICD-10-CM | POA: Diagnosis not present

## 2023-08-08 DIAGNOSIS — Z6827 Body mass index (BMI) 27.0-27.9, adult: Secondary | ICD-10-CM | POA: Diagnosis not present

## 2023-08-31 DIAGNOSIS — F1120 Opioid dependence, uncomplicated: Secondary | ICD-10-CM | POA: Diagnosis not present

## 2023-08-31 DIAGNOSIS — E1159 Type 2 diabetes mellitus with other circulatory complications: Secondary | ICD-10-CM | POA: Diagnosis not present

## 2023-08-31 DIAGNOSIS — G546 Phantom limb syndrome with pain: Secondary | ICD-10-CM | POA: Diagnosis not present

## 2023-09-08 DIAGNOSIS — E663 Overweight: Secondary | ICD-10-CM | POA: Diagnosis not present

## 2023-09-08 DIAGNOSIS — Z6827 Body mass index (BMI) 27.0-27.9, adult: Secondary | ICD-10-CM | POA: Diagnosis not present

## 2023-09-08 DIAGNOSIS — G894 Chronic pain syndrome: Secondary | ICD-10-CM | POA: Diagnosis not present

## 2023-09-13 ENCOUNTER — Encounter: Payer: Self-pay | Admitting: Family

## 2023-09-13 ENCOUNTER — Ambulatory Visit (INDEPENDENT_AMBULATORY_CARE_PROVIDER_SITE_OTHER): Payer: Medicare PPO | Admitting: Family

## 2023-09-13 DIAGNOSIS — Z89511 Acquired absence of right leg below knee: Secondary | ICD-10-CM

## 2023-09-13 DIAGNOSIS — Z89512 Acquired absence of left leg below knee: Secondary | ICD-10-CM

## 2023-09-13 NOTE — Progress Notes (Signed)
Office Visit Note   Patient: Eugene Diaz           Date of Birth: 03-05-1966           MRN: [id number] Visit Date: 09/13/2023              Requested by: Elfredia Nevins, MD [address] [place],  [place] [address] PCP: Elfredia Nevins, MD  Chief Complaint  Patient presents with   Right Leg - Follow-up    Hx BKA      HPI: The patient is a 57 year old gentleman who presents today for evaluation of his right residual limb he is status post below-knee amputation.  He would like to proceed with prosthesis set up.  He also has a below-knee amputation on the left his prosthetic for this knee is quite large he is wearing several layers of ply and still has a poor fit  Patient is a new right transtibial  amputee.  Patient's current comorbidities are not expected to impact the ability to function with the prescribed prosthesis. Patient verbally communicates a strong desire to use a prosthesis. Patient currently requires mobility aids to ambulate without a prosthesis.  Expects not to use mobility aids with a new prosthesis.  Patient is a K3 level ambulator that spends a lot of time walking around on uneven terrain over obstacles, up and down stairs, and ambulates with a variable cadence.  Patient is an existing right transtibial  amputee.  Patient's current comorbidities are not expected to impact the ability to function with the prescribed prosthesis. Patient verbally communicates a strong desire to use a prosthesis. Patient currently requires mobility aids to ambulate without a prosthesis.  Expects not to use mobility aids with a new prosthesis.  Patient is a K3 level ambulator that spends a lot of time walking around on uneven terrain over obstacles, up and down stairs, and ambulates with a variable cadence.     Assessment & Plan: Visit Diagnoses: No diagnosis found.  Plan: Proceed with prosthesis set up order given today for new prosthesis bilaterally.  Follow-Up  Instructions: Return in about 2 weeks (around 09/27/2023), or if symptoms worsen or fail to improve.   Ortho Exam  Patient is alert, oriented, no adenopathy, well-dressed, normal affect, normal respiratory effort. On examination right residual limb this is well-healed would benefit from compression for further consolidation.  On examination left residual limb there is no open area no callus no impending skin breakdown  Imaging: No results found. No images are attached to the encounter.  Labs: Lab Results  Component Value Date   HGBA1C 5.6 04/27/2023   HGBA1C 6.1 (H) 10/12/2022   HGBA1C 5.1 05/20/2022   ESRSEDRATE 9 04/24/2023   ESRSEDRATE 106 (H) 02/20/2023   ESRSEDRATE 6 11/21/2022   CRP 5.6 04/24/2023   CRP 50.4 (H) 02/20/2023   CRP 22.1 (H) 11/21/2022   REPTSTATUS 06/05/2023 FINAL 05/31/2023   REPTSTATUS 06/05/2023 FINAL 05/31/2023   GRAMSTAIN NO WBC SEEN NO ORGANISMS SEEN  04/28/2023   CULT  05/31/2023    NO GROWTH 5 DAYS Performed at [facility], [address] [place], [place] [address]    CULT  05/31/2023    NO GROWTH 5 DAYS Performed at [facility], [address] [place], [place] [address]    [facility] STREPTOCOCCUS MITIS/ORALIS 05/29/2023     Lab Results  Component Value Date   ALBUMIN 2.7 (L) 06/10/2023   ALBUMIN 3.7 04/27/2023   ALBUMIN 3.1 (L) 10/13/2022  PREALBUMIN 14 (L) 05/20/2022   PREALBUMIN 14.0 (L) 10/11/2019    Lab Results  Component Value Date   MG 1.8 10/13/2022   MG 1.9 10/12/2022   MG 1.6 (L) 05/22/2022   No results found for: "VD25OH"  Lab Results  Component Value Date   PREALBUMIN 14 (L) 05/20/2022   PREALBUMIN 14.0 (L) 10/11/2019      Latest Ref Rng & Units 06/12/2023    5:37 AM 06/01/2023    4:44 AM 05/31/2023    6:17 AM  CBC EXTENDED  WBC 4.0 - 10.5 K/uL 6.5  6.4  5.8   RBC 4.22 - 5.81 MIL/uL 3.62  4.22  4.18   Hemoglobin 13.0 - 17.0 g/dL 16.1  09.6  04.5   HCT 39.0 - 52.0 % 33.7  39.4  38.1   Platelets  150 - 400 K/uL 208  289  246      There is no height or weight on file to calculate BMI.  Orders:  No orders of the defined types were placed in this encounter.  No orders of the defined types were placed in this encounter.    Procedures: No procedures performed  Clinical Data: No additional findings.  ROS:  All other systems negative, except as noted in the HPI. Review of Systems  Objective: Vital Signs: There were no vitals taken for this visit.  Specialty Comments:  No specialty comments available.  PMFS History: Patient Active Problem List   Diagnosis Date Noted   S/P BKA (below knee amputation), right (HCC) 06/09/2023   Bacteremia 05/31/2023   Medication management 05/31/2023   Obesity (BMI 30-39.9) 05/30/2023   Acute osteomyelitis of calcaneum (HCC) 05/30/2023   Sepsis due to enterococcus and Proteus bacteremia due to osteomyelitis of the right foot (HCC) 05/29/2023   Chronic pain 05/29/2023   Osteomyelitis (HCC) 04/27/2023   GAD (generalized anxiety disorder) 10/13/2022   Cellulitis of foot, right 10/12/2022   Vaccine counseling 08/29/2022   Syncope, near 06/20/2022   Syncope and collapse 06/07/2022   CAD in native artery 06/07/2022   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) 05/23/2022   LUE weakness 05/22/2022   Wound infection 05/20/2022   Alcohol use disorder 03/03/2022   Chest pain- w atypical and typical features 03/02/2022   Tobacco abuse 03/02/2022   Essential hypertension 03/02/2022   Gangrene right third toe associated with type 2 diabetes mellitus (HCC), likely osteomyelitis 12/03/2021   Type 2 diabetes mellitus (HCC) 12/03/2021   Right foot infection    Gangrene (HCC)    Osteomyelitis of great toe of right foot (HCC) 08/18/2021   Suicidal ideation 06/09/2021   Diabetic wet gangrene of the foot (HCC)    Unilateral complete BKA, left, 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   Acute blood loss anemia 01/04/2020   Asthma     Depression with anxiety    Hyperglycemia 12/17/2019   Diabetic foot ulcer (HCC)    Anemia, chronic disease 11/10/2019   Marijuana use 11/10/2019   Wound dehiscence 08/31/2019   Hyponatremia 08/14/2019   Normocytic anemia 08/14/2019   B12 deficiency 08/14/2019   Subacute osteomyelitis, right ankle and foot (HCC)    GERD (gastroesophageal reflux disease) 12/19/2018   Hepatic steatosis 06/18/2018   Gastritis, erosive    Dysphagia, idiopathic 12/27/2017   Rectal bleeding 12/27/2017   Intention tremor 06/03/2016   Left knee pain 12/04/2015   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   Diabetic neuropathy (HCC) 10/08/2014   Chest pain, rule out acute myocardial infarction 03/10/2014  Chronic alcohol abuse 03/10/2014   Type 2 diabetes mellitus with peripheral neuropathy (HCC)    HLD (hyperlipidemia)    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) 05/23/2022   Depression    Diabetic neuropathy (HCC)    Edema of both lower extremities    Essential tremor    neurologist-- dr patel--- due to alcohol abuse   GERD (gastroesophageal reflux disease)    History of cellulitis 2020   left lower leg;   recurrent 03-25-2020   History of esophageal stricture    s/p  dilatation 02-13-2018   History of osteomyelitis 2020   left great toe     Hypercholesterolemia    Hypertension    followed by pcp  (nuclear stress test 03-11-2014 low risk w/ no ischemia, ef 65%   Normocytic anemia    followed by pcp   (03-18-2020 had transfusion's 02/ 2021)   Osteomyelitis of great toe of right foot (HCC) 08/18/2021   Peripheral vascular disease (HCC)    Severe depression (HCC) 06/09/2021   Status post incision and drainage followed by Dr Samuella Cota and pcp   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   (03-17-2020  pt states most of incision is healed with exception an area that is still draining,  daily dressing change),  foot is red but not warm to the touch and has swelling but has improved)   Suicidal ideation 06/09/2021   Syncope and collapse 06/07/2022   Syncope, near 06/20/2022   Thrombocytopenia (HCC)    chronic   Type 2 diabetes mellitus (HCC)    followed by pcp---    (03-18-2020  pt stated does not check blood sugar)   Vaccine counseling 08/29/2022    Family History  Problem Relation Age of Onset   Heart attack Mother        Living, 5   Cancer Father        Deceased   Healthy Brother    Healthy Sister    Colon cancer Neg Hx    Colon polyps Neg Hx     Past Surgical History:  Procedure Laterality Date   AMPUTATION Left 10/16/2019   Procedure: Left partial second ray resection; placement of antibiotic beads;  Surgeon: Park Liter, DPM;  Location: [facility];  Service: Podiatry;  Laterality: Left;   AMPUTATION Left 11/13/2019   Procedure: Left Midfoot Amputation - Transmetatarsal vs. Lisfranc; Placement antibiotic beads;  Surgeon: Park Liter, DPM;  Location: [facility];  Service: Podiatry;  Laterality: Left;   AMPUTATION Left 12/21/2019   Procedure: Chopart Amputation left foot;  Surgeon: Park Liter, DPM;  Location: [facility];  Service: Podiatry;  Laterality: Left;   AMPUTATION Left 06/24/2020   Procedure: LEFT BELOW KNEE AMPUTATION;  Surgeon: Nadara Mustard, MD;  Location: [facility] OR;  Service: Orthopedics;  Laterality: Left;   AMPUTATION Right 03/29/2021   Procedure: AMPUTATION RAY;  Surgeon: Park Liter, DPM;  Location: [facility];  Service: Podiatry;  Laterality: Right;   AMPUTATION Right 04/28/2023   Procedure: RIGHT PARTIAL FOOT AMPUTATION;  Surgeon: Felecia Shelling, DPM;  Location: [facility];  Service: Podiatry;  Laterality: Right;   AMPUTATION Right 06/03/2023   Procedure: RIGHT BELOW KNEE AMPUTATION;  Surgeon: Nadara Mustard, MD;  Location: [facility] OR;  Service:  Orthopedics;  Laterality: Right;   AMPUTATION TOE Left 08/14/2019   Procedure: AMPUTATION GREAT TOE;  Surgeon: Park Liter, DPM;  Location: [facility];  Service: Podiatry;  Laterality: Left;   APPLICATION OF WOUND VAC Left 12/21/2019   Procedure: Application Of Wound Vac;  Surgeon: Park Liter, DPM;  [facility];  Service: Podiatry;  Laterality: Left;   APPLICATION OF WOUND VAC Right 06/03/2023   Procedure: APPLICATION OF WOUND VAC;  Surgeon: Nadara Mustard, MD;  [facility]: [facility];  Service: Orthopedics;  Laterality: Right;   BIOPSY  02/13/2018   Procedure: BIOPSY;  Surgeon: West Bali, MD;  [facility];  Service: Endoscopy;;  transverse colon biopsy, gastric biopsy   BONE BIOPSY Right 04/28/2023   Procedure: BONE BIOPSY;  Surgeon: Felecia Shelling, DPM;  [facility]: [facility];  Service: Podiatry;  Laterality: Right;   COLONOSCOPY WITH PROPOFOL N/A 02/13/2018   Procedure: COLONOSCOPY WITH PROPOFOL;  Surgeon: West Bali, MD;  [facility];  Service: Endoscopy;  Laterality: N/A;  11:15am   ESOPHAGOGASTRODUODENOSCOPY (EGD) WITH PROPOFOL N/A 02/13/2018   Procedure: ESOPHAGOGASTRODUODENOSCOPY (EGD) WITH PROPOFOL;  Surgeon: West Bali, MD;  [facility];  Service: Endoscopy;  Laterality: N/A;   I & D EXTREMITY Left 07/16/2019   Procedure: Insicion  AND DEBRIDEMENT EXTREMITY;  Surgeon: Park Liter, DPM;  [facility];  Service: Podiatry;  Laterality: Left;   I & D EXTREMITY Left 06/22/2020   Procedure: IRRIGATION AND DEBRIDEMENT EXTREMITY, left foot and ankle;  Surgeon: Park Liter, DPM;  [facility];  Service: Podiatry;  Laterality: Left;   I & D EXTREMITY Right 03/29/2021   Procedure: IRRIGATION AND DEBRIDEMENT EXTREMITY;  Surgeon: Park Liter, DPM;  [facility];  Service: Podiatry;  Laterality: Right;   INCISION AND DRAINAGE OF WOUND Left 10/13/2019   Procedure: IRRIGATION AND DEBRIDEMENT WOUND OF LEFT FOOT AND FIRST METATARSAL  RESECTION;  Surgeon: Park Liter, DPM;  [facility]: [facility];  Service: Podiatry;  Laterality: Left;   IRRIGATION AND DEBRIDEMENT FOOT Left 11/11/2019   Procedure: Left Foot Wound Irrigation and Debridement;  Surgeon: Park Liter, DPM;  Location: [facility];  Service: Podiatry;  Laterality: Left;   Left arm     Left arm repair (tendon and artery)   PERCUTANEOUS PINNING  07/16/2019   Procedure: Open Reduction Percutaneous Pinning Extremity;  Surgeon: Park Liter, DPM;  [facility];  Service: Podiatry;;   PILONIDAL CYST EXCISION N/A 08/08/2014   Procedure: CYST EXCISION PILONIDAL EXTENSIVE;  Surgeon: Dalia Heading, MD;  Location: [facility];  Service: General;  Laterality: N/A;   POLYPECTOMY  02/13/2018   Procedure: POLYPECTOMY;  Surgeon: West Bali, MD;  [facility];  Service: Endoscopy;;  transverse colon polyp hs, rectal polyps times 2   SAVORY DILATION N/A 02/13/2018   Procedure: SAVORY DILATION;  Surgeon: West Bali, MD;  [facility];  Service: Endoscopy;  Laterality: N/A;   TENDON LENGTHENING Right 12/04/2021   Procedure: TENDON LENGTHENING;  Surgeon: Asencion Islam, DPM;  [facility];  Service: Podiatry;  Laterality: Right;   TRANSMETATARSAL AMPUTATION Right 12/04/2021   Procedure: TRANSMETATARSAL AMPUTATION;  Surgeon: Asencion Islam, DPM;  [facility];  Service: Podiatry;  Laterality: Right;   WOUND DEBRIDEMENT Left 09/04/2019   Procedure: DEBRIDEMENT WOUND WITH COMPLEX  REPAIR OF DEHISCENCE;  Surgeon: Samuella Cota,  Nolon Bussing, DPM;  Location: [facility];  Service: Podiatry;  Laterality: Left;  Leave patient on stretcher   WOUND DEBRIDEMENT Left 10/16/2019   Procedure: Left foot wound debridement and closure;  Surgeon: Park Liter, DPM;  Location: [facility];  Service: Podiatry;  Laterality: Left;   WOUND DEBRIDEMENT Left 12/18/2019   Procedure: LEFT FOOT DEBRIDEMENT WITH PARTIAL INCISION OF INFECTED BONE;  Surgeon: Park Liter, DPM;   [facility];  Service: Podiatry;  Laterality: Left;  LEFT FOOT DEBRIDEMENT WITH PARTIAL INCISION OF INFECTED BONE   WOUND DEBRIDEMENT Right 04/01/2021   Procedure: Right foot wound debridement and irrigation and closure, bone biopsy;  Surgeon: Park Liter, DPM;  Location: [facility];  Service: Podiatry;  Laterality: Right;   Social History   Occupational History   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  Tobacco Use   Smoking status: Every Day    Types: Cigars   Smokeless tobacco: Never   Tobacco comments:    Quit smoking cigarettes; now smokes cigars, wants to quit  Vaping Use   Vaping status: Never Used  Substance and Sexual Activity   Alcohol use: Yes    Alcohol/week: 50.0 standard drinks of alcohol    Types: 50 Standard drinks or equivalent per week    Comment: Drinks 6 beers, liquor 4-6oz (both daily) x 35 years; now states he "drinks a few beers and let it go"   Drug use: Not Currently    Types: Marijuana   Sexual activity: Yes    Birth control/protection: None

## 2023-10-06 ENCOUNTER — Ambulatory Visit (HOSPITAL_BASED_OUTPATIENT_CLINIC_OR_DEPARTMENT_OTHER): Payer: Medicare PPO | Admitting: Cardiovascular Disease

## 2023-10-06 ENCOUNTER — Encounter (HOSPITAL_BASED_OUTPATIENT_CLINIC_OR_DEPARTMENT_OTHER): Payer: Self-pay | Admitting: Cardiovascular Disease

## 2023-10-06 VITALS — BP 146/68 | HR 83 | Ht 60.0 in | Wt 217.3 lb

## 2023-10-06 DIAGNOSIS — E782 Mixed hyperlipidemia: Secondary | ICD-10-CM

## 2023-10-06 DIAGNOSIS — E1152 Type 2 diabetes mellitus with diabetic peripheral angiopathy with gangrene: Secondary | ICD-10-CM | POA: Diagnosis not present

## 2023-10-06 DIAGNOSIS — I10 Essential (primary) hypertension: Secondary | ICD-10-CM | POA: Diagnosis not present

## 2023-10-06 DIAGNOSIS — R55 Syncope and collapse: Secondary | ICD-10-CM | POA: Diagnosis not present

## 2023-10-06 DIAGNOSIS — E663 Overweight: Secondary | ICD-10-CM | POA: Diagnosis not present

## 2023-10-06 DIAGNOSIS — Z89511 Acquired absence of right leg below knee: Secondary | ICD-10-CM | POA: Diagnosis not present

## 2023-10-06 DIAGNOSIS — Z720 Tobacco use: Secondary | ICD-10-CM

## 2023-10-06 DIAGNOSIS — I2510 Atherosclerotic heart disease of native coronary artery without angina pectoris: Secondary | ICD-10-CM

## 2023-10-06 DIAGNOSIS — F1090 Alcohol use, unspecified, uncomplicated: Secondary | ICD-10-CM

## 2023-10-06 DIAGNOSIS — Z89512 Acquired absence of left leg below knee: Secondary | ICD-10-CM | POA: Diagnosis not present

## 2023-10-06 DIAGNOSIS — Z6827 Body mass index (BMI) 27.0-27.9, adult: Secondary | ICD-10-CM | POA: Diagnosis not present

## 2023-10-06 DIAGNOSIS — G546 Phantom limb syndrome with pain: Secondary | ICD-10-CM | POA: Diagnosis not present

## 2023-10-06 DIAGNOSIS — F1120 Opioid dependence, uncomplicated: Secondary | ICD-10-CM | POA: Diagnosis not present

## 2023-10-06 MED ORDER — AMLODIPINE BESYLATE 10 MG PO TABS: 5.0000 mg | ORAL_TABLET | Freq: Every day | ORAL | 3 refills | Status: DC

## 2023-10-06 MED ORDER — ASPIRIN 81 MG PO TBEC: 81.0000 mg | DELAYED_RELEASE_TABLET | Freq: Every day | ORAL | 12 refills | Status: AC

## 2023-10-06 NOTE — Progress Notes (Signed)
Cardiology Office Note:  .   Date:  10/06/2023  ID:  Eugene Diaz, DOB Aug 20, 1966, MRN [id number] PCP: Eugene Nevins, MD  [facility] Providers Cardiologist:  Eugene Si, MD    History of Present Illness: .   Eugene Diaz is a 57 y.o. male with a hx of diabetes, hypertension, hyperlipidemia, tobacco abuse, alcohol abuse, PAD s/p left BKA and right transmetatarsal amputation here for follow up. He was seen in the hospital 02/2022 with chest pain. Coronary CTA revealed mild disease in the LAD, LCX, and RCA. He was seen in the ED yesterday due to an unresponsive episode. He was found down on his porch and there was some concern for narcotic overdose, however in the ED he did not have any opioids on his tox screen. He was positive for THC. He was negative for alcohol. Head CT was unremarkable other than chronic microvascular disease.    Eugene Diaz had an episode of near syncope.  He wore a 14-day monitor that showed runs of SVT.  He was also doing physical therapy for his right leg.  He had restarted smoking.  He came for fasting lipids which showed that his LDL was not at goal and it was recommended that he increase rosuvastatin.  This was 08/2022 he was feeling well.  He was healing from toe amputation and had an ulcer that was healing slowly.  He is using a wound VAC and seeing Dr. Allena Diaz for debridement and dressings.  Blood pressure is uncontrolled and lisinopril was resumed.  He was admitted 09/2022 for an infected diabetic wound in the foot.  Right ABIs were normal.  Podiatry recommended on surgical interventions.  He saw Eugene Shields, NP 10/2022 and was feeling well.   At his visit 03/2023 Eugene Diaz reports ongoing issues with a significant wound on his foot, describing it as having "a big hole" and expressing frustration over its lack of improvement despite applying medication. He stopped antibiotics but was instructed to resume them.  He is scheduled for an MRI later in the  week to further assess the condition.  He states that the foot pain significantly limits his mobility and ability to exercise.  He was frustrated with his inability to do things that brought him joy and had been unable to quit smoking.  At that visit rosuvastatin was increased.  He underwent R below the knee amputation and was discharged to rehab.  He was set up with the prosthesis 09/2023.  Eugene Diaz presents with a persistent cough and a "rasp" in his voice. He reports no recent changes in his health status, but acknowledges a recent return to smoking after a long period of abstinence. He is currently smoking just over a pack a day. He has previously quit "cold [place]" but is considering using patches to assist with cessation this time.  He also reports a sedentary lifestyle, living [place] location with limited opportunities for social interaction or physical activity. He expresses interest in obtaining a gym membership and has discussed this possibility with his son-in-law.  Eugene Diaz admits to not regularly monitoring his blood pressure or blood glucose levels at home, but states he can usually tell when these are running high or low. He denies any recent episodes of dizziness or other symptoms suggestive of hypoglycemia or hypertension. He reports adherence to his current medication regimen, which includes Lisinopril, which was recently increased from 5mg  to 10mg .  His blood pressure was found to be elevated during  the consultation, and he reports no adverse effects from his current antihypertensive medications. He also reports no new or worsening symptoms related to his diabetes or cardiovascular disease.     ROS:  As per HPI  Studies Reviewed: Marland Kitchen   EKG Interpretation Date/Time:  Friday October 06 2023 14:17:57 EST Ventricular Rate:  76 PR Interval:  188 QRS Duration:  98 QT Interval:  376 QTC Calculation: 423 R Axis:   -70  Text Interpretation: Normal sinus rhythm Left axis  deviation Septal infarct , age undetermined Inferior infarct , age undetermined No significant change since last tracing Confirmed by Eugene Diaz ([id number]) on 10/06/2023 2:42:33 PM     Risk Assessment/Calculations:     HYPERTENSION CONTROL Vitals:   10/06/23 1415 10/06/23 1747  BP: (!) 142/72 (!) 146/68    The patient's blood pressure is elevated above target today.  In order to address the patient's elevated BP: A current anti-hypertensive medication was adjusted today.          Physical Exam:   VS:  BP (!) 146/68   Pulse 83   Ht 5' (1.524 m)   Wt 217 lb 4.8 oz (98.6 kg)   SpO2 93%   BMI 42.44 kg/m  , BMI Body mass index is 42.44 kg/m. GENERAL:  Well appearing HEENT: Pupils equal round and reactive, fundi not visualized, oral mucosa unremarkable NECK:  No jugular venous distention, waveform within normal limits, carotid upstroke brisk and symmetric, no bruits, no thyromegaly LUNGS:  Clear to auscultation bilaterally HEART:  RRR.  PMI not displaced or sustained,S1 and S2 within normal limits, no S3, no S4, no clicks, no rubs, no murmurs ABD:  Flat, positive bowel sounds normal in frequency in pitch, no bruits, no rebound, no guarding, no midline pulsatile mass, no hepatomegaly, no splenomegaly EXT:  Bilateral BKAs SKIN:  No rashes no nodules NEURO:  Cranial nerves II through XII grossly intact, motor grossly intact throughout PSYCH:  Cognitively intact, oriented to person place and time   ASSESSMENT AND PLAN: .       # Hypertension Blood pressure elevated at today's visit. Patient is currently on Lisinopril 10mg  and Amlodipine 5mg . No symptoms of dizziness or lightheadedness reported.  BP goal is <130/80. -Increase Amlodipine to 10mg  daily.  Continue lisinopril which was recently increased. -Recheck blood pressure in a couple of months.  # Tobacco Use Patient reports smoking a little over a pack a day. Previously quit cold [place] but has since resumed  smoking. -Encouraged to call 1-800-QUIT-NOW for resources and support. -Discussed potential use of nicotine patches.  # PAD: # CAD:  # Hyperlipidemia: s/p bilateral BKAs.  Patient has a history of arterial blockages but is not currently on aspirin therapy.  LDL was 65 on 05/2023. -Start Aspirin 81mg  daily  -Continue rosuvastatin  General Health Patient reports feeling isolated at home and is considering joining a gym. -Encouraged to pursue gym membership for physical activity and social interaction.  Follow-up in a couple of months to reassess blood pressure control and discuss progress with smoking cessation.      Dispo: f/u in two months with Eugene Shields, NP.  F/u with Donnamarie Shankles C. Duke Salvia, MD, [facility] in 1 year.  Signed, Eugene Si, MD

## 2023-10-06 NOTE — Patient Instructions (Addendum)
Medication Instructions:  Your physician has recommended you make the following change in your medication:   START Amlodipine 10 mg daily START Asprin 81mg  daily  *If you need a refill on your cardiac medications before your next appointment, please call your pharmacy*    Follow-Up: At [facility], you and your health needs are our priority.  As part of our continuing mission to provide you with exceptional heart care, we have created designated Provider Care Teams.  These Care Teams include your primary Cardiologist (physician) and Advanced Practice Providers (APPs -  Physician Assistants and Nurse Practitioners) who all work together to provide you with the care you need, when you need it.  We recommend signing up for the patient portal called "MyChart".  Sign up information is provided on this After Visit Summary.  MyChart is used to connect with patients for Virtual Visits (Telemedicine).  Patients are able to view lab/test results, encounter notes, upcoming appointments, etc.  Non-urgent messages can be sent to your provider as well.   To learn more about what you can do with MyChart, go to [email or web address].    Your next appointment:   Follow up in 2 months with Gillian Shields, NP Follow up with Dr. Duke Salvia in 1 year   Other Instructions  Consider calling 1-800-QUIT-NOW to help you quit smoking.

## 2023-11-03 DIAGNOSIS — G894 Chronic pain syndrome: Secondary | ICD-10-CM | POA: Diagnosis not present

## 2023-11-22 ENCOUNTER — Encounter: Payer: Self-pay | Admitting: Family

## 2023-11-22 ENCOUNTER — Ambulatory Visit (INDEPENDENT_AMBULATORY_CARE_PROVIDER_SITE_OTHER): Payer: 59 | Admitting: Family

## 2023-11-22 DIAGNOSIS — Z89511 Acquired absence of right leg below knee: Secondary | ICD-10-CM | POA: Diagnosis not present

## 2023-11-22 DIAGNOSIS — Z89512 Acquired absence of left leg below knee: Secondary | ICD-10-CM

## 2023-11-22 NOTE — Progress Notes (Signed)
Office Visit Note   Patient: Eugene Diaz           Date of Birth: April 08, 1966           MRN: [id number] Visit Date: 11/22/2023              Requested by: Elfredia Nevins, MD [address] [place],  [place] [address] PCP: Elfredia Nevins, MD  Chief Complaint  Patient presents with   Right Leg - Follow-up    Hx right bka      HPI: The patient is a 58 year old gentleman who presents today for evaluation of bilateral below-knee amputations.  He would like an order for prosthesis set up for his right residual limb.  His socket for his left is L fitting much too large due to volume loss.  He does not have enough applied to fill out the socket  Patient is a new right transtibial  amputee.  Patient's current comorbidities are not expected to impact the ability to function with the prescribed prosthesis. Patient verbally communicates a strong desire to use a prosthesis. Patient currently requires mobility aids to ambulate without a prosthesis.  Expects not to use mobility aids with a new prosthesis.  Patient is a K3 level ambulator that spends a lot of time walking around on uneven terrain over obstacles, up and down stairs, and ambulates with a variable cadence.  Patient is an existing left transtibial  amputee.  Patient's current comorbidities are not expected to impact the ability to function with the prescribed prosthesis. Patient verbally communicates a strong desire to use a prosthesis. Patient currently requires mobility aids to ambulate without a prosthesis.  Expects not to use mobility aids with a new prosthesis.  Patient is a K3 level ambulator that spends a lot of time walking around on uneven terrain over obstacles, up and down stairs, and ambulates with a variable cadence.       Assessment & Plan: Visit Diagnoses: No diagnosis found.  Plan: Given an order for socket replacement on the left new prosthesis set up on the right.  Patient follow-up as  needed.  Follow-Up Instructions: No follow-ups on file.   Ortho Exam  Patient is alert, oriented, no adenopathy, well-dressed, normal affect, normal respiratory effort. On examination of the bilateral residual limbs the transtibial amputations are well-healed well consolidated there is no gaping no open area no callus buildup  Imaging: No results found. No images are attached to the encounter.  Labs: Lab Results  Component Value Date   HGBA1C 5.6 04/27/2023   HGBA1C 6.1 (H) 10/12/2022   HGBA1C 5.1 05/20/2022   ESRSEDRATE 9 04/24/2023   ESRSEDRATE 106 (H) 02/20/2023   ESRSEDRATE 6 11/21/2022   CRP 5.6 04/24/2023   CRP 50.4 (H) 02/20/2023   CRP 22.1 (H) 11/21/2022   REPTSTATUS 06/05/2023 FINAL 05/31/2023   REPTSTATUS 06/05/2023 FINAL 05/31/2023   GRAMSTAIN NO WBC SEEN NO ORGANISMS SEEN  04/28/2023   CULT  05/31/2023    NO GROWTH 5 DAYS Performed at [facility], [address], [place], [place] [address]    CULT  05/31/2023    NO GROWTH 5 DAYS Performed at [facility], [address], [place], [place] [address]    [facility] STREPTOCOCCUS MITIS/ORALIS 05/29/2023     Lab Results  Component Value Date   ALBUMIN 2.7 (L) 06/10/2023   ALBUMIN 3.7 04/27/2023   ALBUMIN 3.1 (L) 10/13/2022   PREALBUMIN 14 (L) 05/20/2022   PREALBUMIN 14.0 (L) 10/11/2019  Lab Results  Component Value Date   MG 1.8 10/13/2022   MG 1.9 10/12/2022   MG 1.6 (L) 05/22/2022   No results found for: "VD25OH"  Lab Results  Component Value Date   PREALBUMIN 14 (L) 05/20/2022   PREALBUMIN 14.0 (L) 10/11/2019      Latest Ref Rng & Units 06/12/2023    5:37 AM 06/01/2023    4:44 AM 05/31/2023    6:17 AM  CBC EXTENDED  WBC 4.0 - 10.5 K/uL 6.5  6.4  5.8   RBC 4.22 - 5.81 MIL/uL 3.62  4.22  4.18   Hemoglobin 13.0 - 17.0 g/dL 91.4  78.2  95.6   HCT 39.0 - 52.0 % 33.7  39.4  38.1   Platelets 150 - 400 K/uL 208  289  246      There is no height or weight on file to calculate  BMI.  Orders:  No orders of the defined types were placed in this encounter.  No orders of the defined types were placed in this encounter.    Procedures: No procedures performed  Clinical Data: No additional findings.  ROS:  All other systems negative, except as noted in the HPI. Review of Systems  Objective: Vital Signs: There were no vitals taken for this visit.  Specialty Comments:  No specialty comments available.  PMFS History: Patient Active Problem List   Diagnosis Date Noted   S/P BKA (below knee amputation), right (HCC) 06/09/2023   Bacteremia 05/31/2023   Medication management 05/31/2023   Obesity (BMI 30-39.9) 05/30/2023   Acute osteomyelitis of calcaneum (HCC) 05/30/2023   Sepsis due to enterococcus and Proteus bacteremia due to osteomyelitis of the right foot (HCC) 05/29/2023   Chronic pain 05/29/2023   Osteomyelitis (HCC) 04/27/2023   GAD (generalized anxiety disorder) 10/13/2022   Cellulitis of foot, right 10/12/2022   Vaccine counseling 08/29/2022   Syncope, near 06/20/2022   Syncope and collapse 06/07/2022   CAD in native artery 06/07/2022   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) 05/23/2022   LUE weakness 05/22/2022   Wound infection 05/20/2022   Alcohol use disorder 03/03/2022   Chest pain- w atypical and typical features 03/02/2022   Tobacco abuse 03/02/2022   Essential hypertension 03/02/2022   Gangrene right third toe associated with type 2 diabetes mellitus (HCC), likely osteomyelitis 12/03/2021   Type 2 diabetes mellitus (HCC) 12/03/2021   Right foot infection    Gangrene (HCC)    Osteomyelitis of great toe of right foot (HCC) 08/18/2021   Suicidal ideation 06/09/2021   Diabetic wet gangrene of the foot (HCC)    Unilateral complete BKA, left, 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   Acute blood loss anemia 01/04/2020   Asthma    Depression with anxiety    Hyperglycemia 12/17/2019   Diabetic foot ulcer (HCC)    Anemia,  chronic disease 11/10/2019   Marijuana use 11/10/2019   Wound dehiscence 08/31/2019   Hyponatremia 08/14/2019   Normocytic anemia 08/14/2019   B12 deficiency 08/14/2019   Subacute osteomyelitis, right ankle and foot (HCC)    GERD (gastroesophageal reflux disease) 12/19/2018   Hepatic steatosis 06/18/2018   Gastritis, erosive    Dysphagia, idiopathic 12/27/2017   Rectal bleeding 12/27/2017   Intention tremor 06/03/2016   Left knee pain 12/04/2015   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   Diabetic neuropathy (HCC) 10/08/2014   Chest pain, rule out acute myocardial infarction 03/10/2014   Chronic alcohol abuse 03/10/2014   Type 2 diabetes mellitus with  peripheral neuropathy (HCC)    HLD (hyperlipidemia)    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) 05/23/2022   Depression    Diabetic neuropathy (HCC)    Edema of both lower extremities    Essential tremor    neurologist-- dr patel--- due to alcohol abuse   GERD (gastroesophageal reflux disease)    History of cellulitis 2020   left lower leg;   recurrent 03-25-2020   History of esophageal stricture    s/p  dilatation 02-13-2018   History of osteomyelitis 2020   left great toe     Hypercholesterolemia    Hypertension    followed by pcp  (nuclear stress test 03-11-2014 low risk w/ no ischemia, ef 65%   Normocytic anemia    followed by pcp   (03-18-2020 had transfusion's 02/ 2021)   Osteomyelitis of great toe of right foot (HCC) 08/18/2021   Peripheral vascular disease (HCC)    Severe depression (HCC) 06/09/2021   Status post incision and drainage followed by Dr Samuella Cota and pcp   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   (03-17-2020  pt states most of incision is healed with exception an area that is  still draining,  daily dressing change),  foot is red but not warm to the touch and has swelling but has improved)   Suicidal ideation 06/09/2021   Syncope and collapse 06/07/2022   Syncope, near 06/20/2022   Thrombocytopenia (HCC)    chronic   Type 2 diabetes mellitus (HCC)    followed by pcp---    (03-18-2020  pt stated does not check blood sugar)   Vaccine counseling 08/29/2022    Family History  Problem Relation Age of Onset   Heart attack Mother        Living, 91   Cancer Father        Deceased   Healthy Brother    Healthy Sister    Colon cancer Neg Hx    Colon polyps Neg Hx     Past Surgical History:  Procedure Laterality Date   AMPUTATION Left 10/16/2019   Procedure: Left partial second ray resection; placement of antibiotic beads;  Surgeon: Park Liter, DPM;  Location: [facility];  Service: Podiatry;  Laterality: Left;   AMPUTATION Left 11/13/2019   Procedure: Left Midfoot Amputation - Transmetatarsal vs. Lisfranc; Placement antibiotic beads;  Surgeon: Park Liter, DPM;  Location: [facility];  Service: Podiatry;  Laterality: Left;   AMPUTATION Left 12/21/2019   Procedure: Chopart Amputation left foot;  Surgeon: Park Liter, DPM;  Location: [facility];  Service: Podiatry;  Laterality: Left;   AMPUTATION Left 06/24/2020   Procedure: LEFT BELOW KNEE AMPUTATION;  Surgeon: Nadara Mustard, MD;  Location: [facility];  Service: Orthopedics;  Laterality: Left;   AMPUTATION Right 03/29/2021   Procedure: AMPUTATION RAY;  Surgeon: Park Liter, DPM;  Location: [facility];  Service: Podiatry;  Laterality: Right;   AMPUTATION Right 04/28/2023   Procedure: RIGHT PARTIAL FOOT AMPUTATION;  Surgeon: Felecia Shelling, DPM;  Location: [facility];  Service: Podiatry;  Laterality: Right;   AMPUTATION Right 06/03/2023   Procedure: RIGHT BELOW KNEE AMPUTATION;  Surgeon: Nadara Mustard, MD;  Location: [facility];  Service: Orthopedics;  Laterality: Right;   AMPUTATION TOE Left 08/14/2019   Procedure: AMPUTATION GREAT TOE;   Surgeon: Park Liter, DPM;  Location: [facility];  Service: Podiatry;  Laterality: Left;   APPLICATION OF WOUND VAC Left 12/21/2019   Procedure: Application Of Wound Vac;  Surgeon: Park Liter, DPM;  [facility];  Service: Podiatry;  Laterality: Left;   APPLICATION OF WOUND VAC Right 06/03/2023   Procedure: APPLICATION OF WOUND VAC;  Surgeon: Nadara Mustard, MD;  [facility]: [facility];  Service: Orthopedics;  Laterality: Right;   BIOPSY  02/13/2018   Procedure: BIOPSY;  Surgeon: West Bali, MD;  [facility]: [facility];  Service: Endoscopy;;  transverse colon biopsy, gastric biopsy   BONE BIOPSY Right 04/28/2023   Procedure: BONE BIOPSY;  Surgeon: Felecia Shelling, DPM;  [facility]: [facility];  Service: Podiatry;  Laterality: Right;   COLONOSCOPY WITH PROPOFOL N/A 02/13/2018   Procedure: COLONOSCOPY WITH PROPOFOL;  Surgeon: West Bali, MD;  [facility]: [facility];  Service: Endoscopy;  Laterality: N/A;  11:15am   ESOPHAGOGASTRODUODENOSCOPY (EGD) WITH PROPOFOL N/A 02/13/2018   Procedure: ESOPHAGOGASTRODUODENOSCOPY (EGD) WITH PROPOFOL;  Surgeon: West Bali, MD;  [facility];  Service: Endoscopy;  Laterality: N/A;   I & D EXTREMITY Left 07/16/2019   Procedure: Insicion  AND DEBRIDEMENT EXTREMITY;  Surgeon: Park Liter, DPM;  [facility];  Service: Podiatry;  Laterality: Left;   I & D EXTREMITY Left 06/22/2020   Procedure: IRRIGATION AND DEBRIDEMENT EXTREMITY, left foot and ankle;  Surgeon: Park Liter, DPM;  [facility];  Service: Podiatry;  Laterality: Left;   I & D EXTREMITY Right 03/29/2021   Procedure: IRRIGATION AND DEBRIDEMENT EXTREMITY;  Surgeon: Park Liter, DPM;  [facility];  Service: Podiatry;  Laterality: Right;   INCISION AND DRAINAGE OF WOUND Left 10/13/2019   Procedure: IRRIGATION AND DEBRIDEMENT WOUND OF LEFT FOOT AND FIRST METATARSAL RESECTION;  Surgeon: Park Liter, DPM;  Location: [facility];  Service: Podiatry;  Laterality: Left;    IRRIGATION AND DEBRIDEMENT FOOT Left 11/11/2019   Procedure: Left Foot Wound Irrigation and Debridement;  Surgeon: Park Liter, DPM;  Location: [facility];  Service: Podiatry;  Laterality: Left;   Left arm     Left arm repair (tendon and artery)   PERCUTANEOUS PINNING  07/16/2019   Procedure: Open Reduction Percutaneous Pinning Extremity;  Surgeon: Park Liter, DPM;  [facility];  Service: Podiatry;;   PILONIDAL CYST EXCISION N/A 08/08/2014   Procedure: CYST EXCISION PILONIDAL EXTENSIVE;  Surgeon: Dalia Heading, MD;  Location: [facility];  Service: General;  Laterality: N/A;   POLYPECTOMY  02/13/2018   Procedure: POLYPECTOMY;  Surgeon: West Bali, MD;  [facility];  Service: Endoscopy;;  transverse colon polyp hs, rectal polyps times 2   SAVORY DILATION N/A 02/13/2018   Procedure: SAVORY DILATION;  Surgeon: West Bali, MD;  [facility];  Service: Endoscopy;  Laterality: N/A;   TENDON LENGTHENING Right 12/04/2021   Procedure: TENDON LENGTHENING;  Surgeon: Asencion Islam, DPM;  [facility];  Service: Podiatry;  Laterality: Right;   TRANSMETATARSAL AMPUTATION Right 12/04/2021   Procedure: TRANSMETATARSAL AMPUTATION;  Surgeon: Asencion Islam, DPM;  [facility];  Service: Podiatry;  Laterality: Right;   WOUND DEBRIDEMENT Left 09/04/2019   Procedure: DEBRIDEMENT WOUND WITH COMPLEX  REPAIR OF DEHISCENCE;  Surgeon: Park Liter, DPM;  [facility];  Service: Podiatry;  Laterality: Left;  Leave patient on stretcher   WOUND DEBRIDEMENT Left 10/16/2019   Procedure: Left foot wound debridement and closure;  Surgeon: Park Liter, DPM;  Location: [facility];  Service: Podiatry;  Laterality: Left;   WOUND DEBRIDEMENT Left 12/18/2019   Procedure: LEFT FOOT DEBRIDEMENT WITH PARTIAL INCISION OF INFECTED BONE;  Surgeon: Park Liter, DPM;  [facility];  Service: Podiatry;  Laterality: Left;  LEFT FOOT DEBRIDEMENT WITH PARTIAL INCISION OF  INFECTED BONE   WOUND DEBRIDEMENT Right 04/01/2021   Procedure: Right foot wound debridement and irrigation and closure, bone biopsy;  Surgeon: Park Liter, DPM;  Location: [facility];  Service: Podiatry;  Laterality: Right;   Social History   Occupational History   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  Tobacco Use   Smoking status: Every Day    Types: Cigars   Smokeless tobacco: Never   Tobacco comments:    Quit smoking cigarettes; now smokes cigars, wants to quit  Vaping Use   Vaping status: Never Used  Substance and Sexual Activity   Alcohol use: Yes    Alcohol/week: 50.0 standard drinks of alcohol    Types: 50 Standard drinks or equivalent per week    Comment: Drinks 6 beers, liquor 4-6oz (both daily) x 35 years; now states he "drinks a few beers and let it go"   Drug use: Not Currently    Types: Marijuana   Sexual activity: Yes    Birth control/protection: None

## 2023-12-05 DIAGNOSIS — E1159 Type 2 diabetes mellitus with other circulatory complications: Secondary | ICD-10-CM | POA: Diagnosis not present

## 2023-12-05 DIAGNOSIS — I10 Essential (primary) hypertension: Secondary | ICD-10-CM | POA: Diagnosis not present

## 2023-12-05 DIAGNOSIS — M150 Primary generalized (osteo)arthritis: Secondary | ICD-10-CM | POA: Diagnosis not present

## 2023-12-05 DIAGNOSIS — E1140 Type 2 diabetes mellitus with diabetic neuropathy, unspecified: Secondary | ICD-10-CM | POA: Diagnosis not present

## 2023-12-05 DIAGNOSIS — G546 Phantom limb syndrome with pain: Secondary | ICD-10-CM | POA: Diagnosis not present

## 2023-12-05 DIAGNOSIS — E1152 Type 2 diabetes mellitus with diabetic peripheral angiopathy with gangrene: Secondary | ICD-10-CM | POA: Diagnosis not present

## 2023-12-05 DIAGNOSIS — Z89512 Acquired absence of left leg below knee: Secondary | ICD-10-CM | POA: Diagnosis not present

## 2023-12-05 DIAGNOSIS — Z89511 Acquired absence of right leg below knee: Secondary | ICD-10-CM | POA: Diagnosis not present

## 2023-12-05 DIAGNOSIS — G894 Chronic pain syndrome: Secondary | ICD-10-CM | POA: Diagnosis not present

## 2023-12-29 ENCOUNTER — Ambulatory Visit (HOSPITAL_BASED_OUTPATIENT_CLINIC_OR_DEPARTMENT_OTHER): Payer: 59 | Admitting: Family

## 2023-12-29 DIAGNOSIS — E782 Mixed hyperlipidemia: Secondary | ICD-10-CM | POA: Diagnosis not present

## 2023-12-29 DIAGNOSIS — I10 Essential (primary) hypertension: Secondary | ICD-10-CM | POA: Diagnosis not present

## 2023-12-29 DIAGNOSIS — E1159 Type 2 diabetes mellitus with other circulatory complications: Secondary | ICD-10-CM | POA: Diagnosis not present

## 2024-01-02 DIAGNOSIS — E1140 Type 2 diabetes mellitus with diabetic neuropathy, unspecified: Secondary | ICD-10-CM | POA: Diagnosis not present

## 2024-01-02 DIAGNOSIS — E1169 Type 2 diabetes mellitus with other specified complication: Secondary | ICD-10-CM | POA: Diagnosis not present

## 2024-01-02 DIAGNOSIS — E1152 Type 2 diabetes mellitus with diabetic peripheral angiopathy with gangrene: Secondary | ICD-10-CM | POA: Diagnosis not present

## 2024-01-02 DIAGNOSIS — G546 Phantom limb syndrome with pain: Secondary | ICD-10-CM | POA: Diagnosis not present

## 2024-01-02 DIAGNOSIS — M150 Primary generalized (osteo)arthritis: Secondary | ICD-10-CM | POA: Diagnosis not present

## 2024-01-02 DIAGNOSIS — F1120 Opioid dependence, uncomplicated: Secondary | ICD-10-CM | POA: Diagnosis not present

## 2024-01-02 DIAGNOSIS — Z89512 Acquired absence of left leg below knee: Secondary | ICD-10-CM | POA: Diagnosis not present

## 2024-01-02 DIAGNOSIS — G894 Chronic pain syndrome: Secondary | ICD-10-CM | POA: Diagnosis not present

## 2024-01-02 DIAGNOSIS — I10 Essential (primary) hypertension: Secondary | ICD-10-CM | POA: Diagnosis not present

## 2024-01-02 DIAGNOSIS — E1165 Type 2 diabetes mellitus with hyperglycemia: Secondary | ICD-10-CM | POA: Diagnosis not present

## 2024-01-02 DIAGNOSIS — E1159 Type 2 diabetes mellitus with other circulatory complications: Secondary | ICD-10-CM | POA: Diagnosis not present

## 2024-01-18 DIAGNOSIS — Z89512 Acquired absence of left leg below knee: Secondary | ICD-10-CM | POA: Diagnosis not present

## 2024-01-18 DIAGNOSIS — Z89511 Acquired absence of right leg below knee: Secondary | ICD-10-CM | POA: Diagnosis not present

## 2024-01-19 ENCOUNTER — Telehealth: Payer: Self-pay | Admitting: Student

## 2024-01-19 DIAGNOSIS — Z89511 Acquired absence of right leg below knee: Secondary | ICD-10-CM

## 2024-01-19 NOTE — Telephone Encounter (Signed)
 Per Thayer Ohm at [facility], pt needs a PT referral.   Sent to [address]

## 2024-01-26 ENCOUNTER — Other Ambulatory Visit (HOSPITAL_BASED_OUTPATIENT_CLINIC_OR_DEPARTMENT_OTHER): Payer: Self-pay | Admitting: Cardiovascular Disease

## 2024-01-26 ENCOUNTER — Other Ambulatory Visit: Payer: Self-pay

## 2024-01-26 DIAGNOSIS — I25118 Atherosclerotic heart disease of native coronary artery with other forms of angina pectoris: Secondary | ICD-10-CM

## 2024-01-26 DIAGNOSIS — E785 Hyperlipidemia, unspecified: Secondary | ICD-10-CM

## 2024-01-26 DIAGNOSIS — I10 Essential (primary) hypertension: Secondary | ICD-10-CM

## 2024-01-26 MED ORDER — ROSUVASTATIN CALCIUM 20 MG PO TABS: 20.0000 mg | ORAL_TABLET | Freq: Every day | ORAL | 2 refills | Status: DC

## 2024-02-02 DIAGNOSIS — E1152 Type 2 diabetes mellitus with diabetic peripheral angiopathy with gangrene: Secondary | ICD-10-CM | POA: Diagnosis not present

## 2024-02-02 DIAGNOSIS — Z89511 Acquired absence of right leg below knee: Secondary | ICD-10-CM | POA: Diagnosis not present

## 2024-02-02 DIAGNOSIS — Z89512 Acquired absence of left leg below knee: Secondary | ICD-10-CM | POA: Diagnosis not present

## 2024-02-02 DIAGNOSIS — G894 Chronic pain syndrome: Secondary | ICD-10-CM | POA: Diagnosis not present

## 2024-02-02 DIAGNOSIS — E1140 Type 2 diabetes mellitus with diabetic neuropathy, unspecified: Secondary | ICD-10-CM | POA: Diagnosis not present

## 2024-02-02 DIAGNOSIS — E1159 Type 2 diabetes mellitus with other circulatory complications: Secondary | ICD-10-CM | POA: Diagnosis not present

## 2024-02-02 DIAGNOSIS — E1165 Type 2 diabetes mellitus with hyperglycemia: Secondary | ICD-10-CM | POA: Diagnosis not present

## 2024-02-02 DIAGNOSIS — G546 Phantom limb syndrome with pain: Secondary | ICD-10-CM | POA: Diagnosis not present

## 2024-02-02 DIAGNOSIS — G629 Polyneuropathy, unspecified: Secondary | ICD-10-CM | POA: Diagnosis not present

## 2024-02-16 ENCOUNTER — Other Ambulatory Visit (HOSPITAL_BASED_OUTPATIENT_CLINIC_OR_DEPARTMENT_OTHER): Payer: Self-pay | Admitting: Family

## 2024-02-16 DIAGNOSIS — I10 Essential (primary) hypertension: Secondary | ICD-10-CM

## 2024-02-23 ENCOUNTER — Telehealth (HOSPITAL_BASED_OUTPATIENT_CLINIC_OR_DEPARTMENT_OTHER): Payer: Self-pay

## 2024-02-23 DIAGNOSIS — E785 Hyperlipidemia, unspecified: Secondary | ICD-10-CM

## 2024-02-23 DIAGNOSIS — I25118 Atherosclerotic heart disease of native coronary artery with other forms of angina pectoris: Secondary | ICD-10-CM

## 2024-02-23 NOTE — Telephone Encounter (Signed)
Rx already filled by other means.

## 2024-03-01 DIAGNOSIS — E1140 Type 2 diabetes mellitus with diabetic neuropathy, unspecified: Secondary | ICD-10-CM | POA: Diagnosis not present

## 2024-03-01 DIAGNOSIS — E1152 Type 2 diabetes mellitus with diabetic peripheral angiopathy with gangrene: Secondary | ICD-10-CM | POA: Diagnosis not present

## 2024-03-01 DIAGNOSIS — G894 Chronic pain syndrome: Secondary | ICD-10-CM | POA: Diagnosis not present

## 2024-03-01 DIAGNOSIS — Z89512 Acquired absence of left leg below knee: Secondary | ICD-10-CM | POA: Diagnosis not present

## 2024-03-01 DIAGNOSIS — G546 Phantom limb syndrome with pain: Secondary | ICD-10-CM | POA: Diagnosis not present

## 2024-03-01 DIAGNOSIS — E1169 Type 2 diabetes mellitus with other specified complication: Secondary | ICD-10-CM | POA: Diagnosis not present

## 2024-03-01 DIAGNOSIS — I10 Essential (primary) hypertension: Secondary | ICD-10-CM | POA: Diagnosis not present

## 2024-03-01 DIAGNOSIS — E1159 Type 2 diabetes mellitus with other circulatory complications: Secondary | ICD-10-CM | POA: Diagnosis not present

## 2024-03-01 DIAGNOSIS — Z89511 Acquired absence of right leg below knee: Secondary | ICD-10-CM | POA: Diagnosis not present

## 2024-03-07 ENCOUNTER — Telehealth: Payer: Self-pay | Admitting: Cardiovascular Disease

## 2024-03-07 DIAGNOSIS — I25118 Atherosclerotic heart disease of native coronary artery with other forms of angina pectoris: Secondary | ICD-10-CM

## 2024-03-07 DIAGNOSIS — E785 Hyperlipidemia, unspecified: Secondary | ICD-10-CM

## 2024-03-07 MED ORDER — ROSUVASTATIN CALCIUM 20 MG PO TABS: 20.0000 mg | ORAL_TABLET | Freq: Every day | ORAL | 2 refills | Status: DC

## 2024-03-07 NOTE — Telephone Encounter (Signed)
*  STAT* If patient is at the pharmacy, call can be transferred to refill team.   1. Which medications need to be refilled? (please list name of each medication and dose if known) rosuvastatin  (CRESTOR ) 20 MG tablet    4. Which pharmacy/location (including street and city if local pharmacy) is medication to be sent to?  [facility] - [place], [place] - [address]     5. Do they need a 30 day or 90 day supply? 90

## 2024-03-07 NOTE — Telephone Encounter (Signed)
 Pt's medication was sent to pt's pharmacy as requested. Confirmation received.

## 2024-03-30 DIAGNOSIS — G546 Phantom limb syndrome with pain: Secondary | ICD-10-CM | POA: Diagnosis not present

## 2024-03-30 DIAGNOSIS — E782 Mixed hyperlipidemia: Secondary | ICD-10-CM | POA: Diagnosis not present

## 2024-04-04 DIAGNOSIS — G894 Chronic pain syndrome: Secondary | ICD-10-CM | POA: Diagnosis not present

## 2024-04-04 DIAGNOSIS — Z89511 Acquired absence of right leg below knee: Secondary | ICD-10-CM | POA: Diagnosis not present

## 2024-04-04 DIAGNOSIS — E1140 Type 2 diabetes mellitus with diabetic neuropathy, unspecified: Secondary | ICD-10-CM | POA: Diagnosis not present

## 2024-04-04 DIAGNOSIS — G546 Phantom limb syndrome with pain: Secondary | ICD-10-CM | POA: Diagnosis not present

## 2024-04-04 DIAGNOSIS — E1169 Type 2 diabetes mellitus with other specified complication: Secondary | ICD-10-CM | POA: Diagnosis not present

## 2024-04-04 DIAGNOSIS — E1159 Type 2 diabetes mellitus with other circulatory complications: Secondary | ICD-10-CM | POA: Diagnosis not present

## 2024-04-04 DIAGNOSIS — E1152 Type 2 diabetes mellitus with diabetic peripheral angiopathy with gangrene: Secondary | ICD-10-CM | POA: Diagnosis not present

## 2024-04-04 DIAGNOSIS — E1165 Type 2 diabetes mellitus with hyperglycemia: Secondary | ICD-10-CM | POA: Diagnosis not present

## 2024-04-04 DIAGNOSIS — I10 Essential (primary) hypertension: Secondary | ICD-10-CM | POA: Diagnosis not present

## 2024-04-04 DIAGNOSIS — E782 Mixed hyperlipidemia: Secondary | ICD-10-CM | POA: Diagnosis not present

## 2024-04-04 DIAGNOSIS — Z89512 Acquired absence of left leg below knee: Secondary | ICD-10-CM | POA: Diagnosis not present

## 2024-04-09 ENCOUNTER — Ambulatory Visit (HOSPITAL_BASED_OUTPATIENT_CLINIC_OR_DEPARTMENT_OTHER): Payer: 59 | Admitting: Family

## 2024-05-08 DIAGNOSIS — G546 Phantom limb syndrome with pain: Secondary | ICD-10-CM | POA: Diagnosis not present

## 2024-05-08 DIAGNOSIS — E1165 Type 2 diabetes mellitus with hyperglycemia: Secondary | ICD-10-CM | POA: Diagnosis not present

## 2024-05-08 DIAGNOSIS — M150 Primary generalized (osteo)arthritis: Secondary | ICD-10-CM | POA: Diagnosis not present

## 2024-05-08 DIAGNOSIS — E1140 Type 2 diabetes mellitus with diabetic neuropathy, unspecified: Secondary | ICD-10-CM | POA: Diagnosis not present

## 2024-05-08 DIAGNOSIS — E1159 Type 2 diabetes mellitus with other circulatory complications: Secondary | ICD-10-CM | POA: Diagnosis not present

## 2024-05-08 DIAGNOSIS — Z89512 Acquired absence of left leg below knee: Secondary | ICD-10-CM | POA: Diagnosis not present

## 2024-05-08 DIAGNOSIS — I10 Essential (primary) hypertension: Secondary | ICD-10-CM | POA: Diagnosis not present

## 2024-05-08 DIAGNOSIS — Z89511 Acquired absence of right leg below knee: Secondary | ICD-10-CM | POA: Diagnosis not present

## 2024-05-08 DIAGNOSIS — G894 Chronic pain syndrome: Secondary | ICD-10-CM | POA: Diagnosis not present

## 2024-05-30 DIAGNOSIS — E782 Mixed hyperlipidemia: Secondary | ICD-10-CM | POA: Diagnosis not present

## 2024-05-30 DIAGNOSIS — E1152 Type 2 diabetes mellitus with diabetic peripheral angiopathy with gangrene: Secondary | ICD-10-CM | POA: Diagnosis not present

## 2024-05-30 DIAGNOSIS — G546 Phantom limb syndrome with pain: Secondary | ICD-10-CM | POA: Diagnosis not present

## 2024-06-06 DIAGNOSIS — Z89511 Acquired absence of right leg below knee: Secondary | ICD-10-CM | POA: Diagnosis not present

## 2024-06-06 DIAGNOSIS — Z89512 Acquired absence of left leg below knee: Secondary | ICD-10-CM | POA: Diagnosis not present

## 2024-06-06 DIAGNOSIS — E1152 Type 2 diabetes mellitus with diabetic peripheral angiopathy with gangrene: Secondary | ICD-10-CM | POA: Diagnosis not present

## 2024-06-06 DIAGNOSIS — E1159 Type 2 diabetes mellitus with other circulatory complications: Secondary | ICD-10-CM | POA: Diagnosis not present

## 2024-06-06 DIAGNOSIS — G546 Phantom limb syndrome with pain: Secondary | ICD-10-CM | POA: Diagnosis not present

## 2024-06-06 DIAGNOSIS — E1140 Type 2 diabetes mellitus with diabetic neuropathy, unspecified: Secondary | ICD-10-CM | POA: Diagnosis not present

## 2024-06-06 DIAGNOSIS — I10 Essential (primary) hypertension: Secondary | ICD-10-CM | POA: Diagnosis not present

## 2024-07-04 DIAGNOSIS — E1159 Type 2 diabetes mellitus with other circulatory complications: Secondary | ICD-10-CM | POA: Diagnosis not present

## 2024-07-04 DIAGNOSIS — E1165 Type 2 diabetes mellitus with hyperglycemia: Secondary | ICD-10-CM | POA: Diagnosis not present

## 2024-07-04 DIAGNOSIS — G629 Polyneuropathy, unspecified: Secondary | ICD-10-CM | POA: Diagnosis not present

## 2024-07-04 DIAGNOSIS — M150 Primary generalized (osteo)arthritis: Secondary | ICD-10-CM | POA: Diagnosis not present

## 2024-07-04 DIAGNOSIS — Z89511 Acquired absence of right leg below knee: Secondary | ICD-10-CM | POA: Diagnosis not present

## 2024-07-04 DIAGNOSIS — G894 Chronic pain syndrome: Secondary | ICD-10-CM | POA: Diagnosis not present

## 2024-07-04 DIAGNOSIS — B372 Candidiasis of skin and nail: Secondary | ICD-10-CM | POA: Diagnosis not present

## 2024-07-04 DIAGNOSIS — I10 Essential (primary) hypertension: Secondary | ICD-10-CM | POA: Diagnosis not present

## 2024-07-04 DIAGNOSIS — G546 Phantom limb syndrome with pain: Secondary | ICD-10-CM | POA: Diagnosis not present

## 2024-07-04 DIAGNOSIS — E1152 Type 2 diabetes mellitus with diabetic peripheral angiopathy with gangrene: Secondary | ICD-10-CM | POA: Diagnosis not present

## 2024-07-05 ENCOUNTER — Emergency Department (HOSPITAL_COMMUNITY)

## 2024-07-05 ENCOUNTER — Emergency Department (HOSPITAL_COMMUNITY)
Admission: EM | Admit: 2024-07-05 | Discharge: 2024-07-05 | Disposition: A | Discharge status: Home / Self Care | Attending: Emergency Medicine | Admitting: Emergency Medicine

## 2024-07-05 ENCOUNTER — Other Ambulatory Visit: Payer: Self-pay

## 2024-07-05 ENCOUNTER — Encounter (HOSPITAL_COMMUNITY): Payer: Self-pay

## 2024-07-05 DIAGNOSIS — R000 Tachycardia, unspecified: Secondary | ICD-10-CM | POA: Insufficient documentation

## 2024-07-05 DIAGNOSIS — I2510 Atherosclerotic heart disease of native coronary artery without angina pectoris: Secondary | ICD-10-CM | POA: Diagnosis not present

## 2024-07-05 DIAGNOSIS — I499 Cardiac arrhythmia, unspecified: Secondary | ICD-10-CM | POA: Diagnosis not present

## 2024-07-05 DIAGNOSIS — R072 Precordial pain: Secondary | ICD-10-CM | POA: Diagnosis not present

## 2024-07-05 DIAGNOSIS — R079 Chest pain, unspecified: Secondary | ICD-10-CM | POA: Diagnosis not present

## 2024-07-05 DIAGNOSIS — Z7982 Long term (current) use of aspirin: Secondary | ICD-10-CM | POA: Insufficient documentation

## 2024-07-05 DIAGNOSIS — R0602 Shortness of breath: Secondary | ICD-10-CM | POA: Diagnosis not present

## 2024-07-05 DIAGNOSIS — R0789 Other chest pain: Secondary | ICD-10-CM | POA: Diagnosis not present

## 2024-07-05 LAB — CBC
HCT: 49.6 % (ref 39.0–52.0)
Hemoglobin: 16.6 g/dL (ref 13.0–17.0)
MCH: 33.2 pg (ref 26.0–34.0)
MCHC: 33.5 g/dL (ref 30.0–36.0)
MCV: 99.2 fL (ref 80.0–100.0)
Platelets: 195 K/uL (ref 150–400)
RBC: 5 MIL/uL (ref 4.22–5.81)
RDW: 13.7 % (ref 11.5–15.5)
WBC: 7.6 K/uL (ref 4.0–10.5)
nRBC: 0 % (ref 0.0–0.2)

## 2024-07-05 LAB — BASIC METABOLIC PANEL WITH GFR
Anion gap: 12 (ref 5–15)
BUN: 14 mg/dL (ref 6–20)
CO2: 20 mmol/L — ABNORMAL LOW (ref 22–32)
Calcium: 8.9 mg/dL (ref 8.9–10.3)
Chloride: 110 mmol/L (ref 98–111)
Creatinine, Ser: 0.81 mg/dL (ref 0.61–1.24)
GFR, Estimated: >60 mL/min (ref >60)
Glucose, Bld: 241 mg/dL — ABNORMAL HIGH (ref 70–99)
Potassium: 3 mmol/L — ABNORMAL LOW (ref 3.5–5.1)
Sodium: 142 mmol/L (ref 135–145)

## 2024-07-05 LAB — TROPONIN I (HIGH SENSITIVITY)
Troponin I (High Sensitivity): 4 ng/L (ref <18)
Troponin I (High Sensitivity): 5 ng/L (ref <18)

## 2024-07-05 MED ORDER — POTASSIUM CHLORIDE CRYS ER 20 MEQ PO TBCR
40.0000 meq | EXTENDED_RELEASE_TABLET | Freq: Once | ORAL | Status: AC
  Administered 2024-07-05: 40 meq via ORAL
  Filled 2024-07-05: qty 2

## 2024-07-05 MED ORDER — NITROGLYCERIN 0.4 MG SL SUBL
0.4000 mg | SUBLINGUAL_TABLET | Freq: Once | SUBLINGUAL | Status: AC
  Administered 2024-07-05: 0.4 mg via SUBLINGUAL
  Filled 2024-07-05: qty 1

## 2024-07-05 NOTE — ED Triage Notes (Signed)
 Patient arrived via [facility] with complaints of chest paint hat started about 45 min ago. 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 EMS gave 4 aspirin  in route.

## 2024-07-05 NOTE — ED Provider Notes (Signed)
 [place] EMERGENCY DEPARTMENT AT [facility] Provider Note   CSN: [id number] Arrival date & time: 07/05/24  1845     Patient presents with: Chest Pain   DELYLE Diaz is a 58 y.o. male.  He is brought in by EMS after experiencing substernal chest pain 8 out of 10 that started about 45 minutes prior to arrival.  He said he was just sitting down at the [employer].  It was associated with shortness of breath.  No nausea or vomiting no diaphoresis.  Was given aspirin  and oxygen  with improvement in his symptoms.  He now rates it as 3 out of 10.  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  He does smoke cigarettes, denies any drug use including cocaine.   The history is provided by the patient.  Chest Pain Pain location:  Substernal area Pain quality: aching   Pain radiates to:  Does not radiate Pain severity:  Severe Onset quality:  Sudden Duration:  45 minutes Timing:  Constant Progression:  Improving Chronicity:  Recurrent Context: at rest   Relieved by:  Aspirin  and oxygen  Associated symptoms: shortness of breath   Associated symptoms: no abdominal pain, no cough, no diaphoresis, no fever, no nausea, no syncope and no vomiting   Risk factors: coronary artery disease and smoking        Prior to Admission medications   Medication Sig Start Date End Date Taking? Authorizing Provider  acetaminophen  (TYLENOL ) 325 MG tablet Take 1-2 tablets (325-650 mg total) by mouth every 4 (four) hours as needed for mild pain. 06/16/23   Setzer, Sandra J, PA-C  albuterol  (VENTOLIN  HFA) 108 (90 Base) MCG/ACT inhaler Inhale 1-2 puffs into the lungs every 4 (four) hours as needed for wheezing or shortness of breath. 05/11/22   [provider]  ALPRAZolam  (XANAX ) 1 MG tablet Take 1 tablet (1 mg total) by mouth 4 (four) times daily as needed for anxiety or sleep. 06/09/23   Cheryle Page, MD  amitriptyline  (ELAVIL ) 100 MG tablet Take 100 mg by mouth at bedtime.  09/15/22   [provider]  amLODipine  (NORVASC ) 10 MG tablet Take 0.5 tablets (5 mg total) by mouth daily. 02/16/24   Raford Riggs, MD  ascorbic acid  (VITAMIN C ) 1000 MG tablet Take 1 tablet (1,000 mg total) by mouth daily. 06/16/23   Setzer, Nena JINNY, PA-C  aspirin  EC (ASPIRIN  81) 81 MG tablet Take 1 tablet (81 mg total) by mouth daily. Swallow whole. 10/06/23   Raford Riggs, MD  folic acid  (FOLVITE ) 1 MG tablet Take 1 tablet (1 mg total) by mouth daily. 03/03/22   Hongalgi, Anand D, MD  gabapentin  (NEURONTIN ) 100 MG capsule Take 1 capsule (100 mg total) by mouth 3 (three) times daily. 06/16/23   Setzer, Nena JINNY, PA-C  lisinopril  (ZESTRIL ) 10 MG tablet TAKE ONE TABLET (10 MG) BY MOUTH DAILY AT 9AM 01/26/24   Raford Riggs, MD  melatonin 5 MG TABS Take 1 tablet (5 mg total) by mouth at bedtime. 06/16/23   Setzer, Sandra J, PA-C  metFORMIN  (GLUCOPHAGE ) 500 MG tablet Take 1 tablet (500 mg total) by mouth 2 (two) times daily with a meal. 06/16/23   Setzer, Nena JINNY, PA-C  methocarbamol  (ROBAXIN ) 500 MG tablet Take 1 tablet (500 mg total) by mouth every 6 (six) hours as needed for muscle spasms. 06/16/23   Setzer, Nena JINNY, PA-C  morphine  (MS CONTIN ) 15 MG 12 hr tablet Take 1 tablet (15 mg  total) by mouth every 12 (twelve) hours. 06/09/23   Cheryle Page, MD  Multiple Vitamin (MULTIVITAMIN WITH MINERALS) TABS tablet Take 1 tablet by mouth daily. 06/16/23   Setzer, Sandra J, PA-C  oxyCODONE  (ROXICODONE ) 30 MG immediate release tablet Take 1 tablet (30 mg total) by mouth every 8 (eight) hours as needed for severe pain. 06/09/23   Cheryle Page, MD  rosuvastatin  (CRESTOR ) 20 MG tablet Take 1 tablet (20 mg total) by mouth daily. 03/07/24   Raford Riggs, MD  senna-docusate (SENOKOT-S) 8.6-50 MG tablet Take 1 tablet by mouth at bedtime. 06/16/23   Setzer, Sandra J, PA-C  thiamine  100 MG tablet Take 1 tablet (100 mg total) by mouth daily. 03/04/22   Hongalgi, Anand D, MD  zinc  sulfate 220 (50 Zn) MG  capsule Take 1 capsule (220 mg total) by mouth daily. 06/16/23   Setzer, Sandra J, PA-C    Allergies: Patient has no known allergies.    Review of Systems  Constitutional:  Negative for diaphoresis and fever.  Eyes:  Negative for visual disturbance.  Respiratory:  Positive for shortness of breath. Negative for cough.   Cardiovascular:  Positive for chest pain. Negative for syncope.  Gastrointestinal:  Negative for abdominal pain, nausea and vomiting.  Genitourinary:  Negative for dysuria.    Updated Vital Signs BP 121/71   Pulse (!) 109   Temp 98 F (36.7 C) (Oral)   Resp 13   Ht 6' 2 (1.88 m)   Wt 111.6 kg   SpO2 93%   BMI 31.58 kg/m   Physical Exam Vitals and nursing note reviewed.  Constitutional:      Appearance: He is well-developed.  HENT:     Head: Normocephalic and atraumatic.  Eyes:     Conjunctiva/sclera: Conjunctivae normal.  Cardiovascular:     Rate and Rhythm: Regular rhythm. Tachycardia present.     Heart sounds: Normal heart sounds. No murmur heard. Pulmonary:     Effort: Pulmonary effort is normal. No respiratory distress.     Breath sounds: Normal breath sounds.  Abdominal:     Palpations: Abdomen is soft.     Tenderness: There is no abdominal tenderness. There is no guarding or rebound.  Musculoskeletal:     Cervical back: Neck supple.     Comments: Bilateral BKA  Skin:    General: Skin is warm and dry.  Neurological:     General: No focal deficit present.     Mental Status: He is alert.     GCS: GCS eye subscore is 4. GCS verbal subscore is 5. GCS motor subscore is 6.     (all labs ordered are listed, but only abnormal results are displayed) Labs Reviewed  BASIC METABOLIC PANEL WITH GFR - Abnormal; Notable for the following components:      Result Value   Potassium 3.0 (*)    CO2 20 (*)    Glucose, Bld 241 (*)    All other components within normal limits  CBC  TROPONIN I (HIGH SENSITIVITY)  TROPONIN I (HIGH SENSITIVITY)     EKG: EKG Interpretation Date/Time:  Friday July 05 2024 18:53:57 EDT Ventricular Rate:  110 PR Interval:  174 QRS Duration:  106 QT Interval:  345 QTC Calculation: 467 R Axis:   -56  Text Interpretation: Sinus tachycardia Left ventricular hypertrophy Inferior infarct, old Anterior infarct, old increased rate from prior 12/24 Confirmed by Towana Sharper [phone]) on 07/05/2024 7:01:21 PM  Radiology: [facility] Chest Port 1 View Result Date: 07/05/2024 CLINICAL  DATA:  Chest pain, coronary artery disease EXAM: PORTABLE CHEST 1 VIEW COMPARISON:  05/20/2022 FINDINGS: Single frontal view of the chest demonstrates an unremarkable cardiac silhouette. No acute airspace disease, effusion, or pneumothorax. No acute bony abnormalities. IMPRESSION: 1. No acute intrathoracic process. Electronically Signed   By: Ozell Daring M.D.   On: 07/05/2024 19:39     Procedures   Medications Ordered in the ED  nitroGLYCERIN  (NITROSTAT ) SL tablet 0.4 mg (0.4 mg Sublingual Given 07/05/24 1917)  potassium chloride  SA (KLOR-CON  M) CR tablet 40 mEq (40 mEq Oral Given 07/05/24 2101)    Clinical Course as of 07/06/24 0847  Fri Jul 05, 2024  1942 Chest x-ray interpreted by me as no acute infiltrate.  Awaiting radiology reading. [MB]  2228 Patient's second troponin is flat.  Patient's pain is resolved.  He is comfortable plan for discharge and outpatient follow-up with cardiology.  Return instructions discussed [MB]    Clinical Course User Index [MB] Towana Ozell BROCKS, MD                                 Medical Decision Making Amount and/or Complexity of Data Reviewed Labs: ordered. Radiology: ordered.  Risk Prescription drug management.   This patient complains of chest pain; this involves an extensive number of treatment Options and is a complaint that carries with it a high risk of complications and morbidity. The differential includes ACS, pneumonia, pneumothorax, PE, vascular, reflux,  musculoskeletal  I ordered, reviewed and interpreted labs, which included CBC normal, chemistries with mildly low potassium and bicarb, troponins flat I ordered medication nitroglycerin , potassium and reviewed PMP when indicated. I ordered imaging studies which included chest x-ray and I independently    visualized and interpreted imaging which showed no acute findings Additional history obtained from EMS Previous records obtained and reviewed in epic, no recent cardiology notes Cardiac monitoring reviewed, sinus rhythm Social determinants considered, tobacco use Critical Interventions: None  After the interventions stated above, I reevaluated the patient and found patient to be pain-free and with flat troponins Admission and further testing considered, admission considered but patient currently pain-free no active evidence of ischemia.  Will refer him back to his cardiology team for close follow-up.  Return instructions discussed.      Final diagnoses:  Nonspecific chest pain    ED Discharge Orders          Ordered    Ambulatory referral to Cardiology       Comments: If you have not heard from the Cardiology office within the next 72 hours please call [phone].   07/05/24 2229               Towana Ozell BROCKS, MD 07/06/24 [phone]

## 2024-07-05 NOTE — ED Notes (Signed)
 Pt states improvement in his chest pain after SL nitroglycerin . Pt states he has been out his blood thinner for several weeks.

## 2024-09-16 ENCOUNTER — Encounter: Payer: Self-pay | Admitting: *Deleted

## 2024-09-16 NOTE — Progress Notes (Signed)
 Eugene Diaz                                          MRN: [id number]   09/16/2024   The [facility] Quality Team Specialist reviewed this patient medical record for the purposes of chart review for care gap closure. The following were reviewed: chart review for care gap closure-glycemic status assessment.    [facility] Quality Team

## 2024-09-19 ENCOUNTER — Encounter (HOSPITAL_BASED_OUTPATIENT_CLINIC_OR_DEPARTMENT_OTHER): Payer: Self-pay | Admitting: Cardiovascular Disease

## 2024-10-21 ENCOUNTER — Ambulatory Visit (HOSPITAL_BASED_OUTPATIENT_CLINIC_OR_DEPARTMENT_OTHER): Admitting: Cardiovascular Disease

## 2024-11-19 ENCOUNTER — Other Ambulatory Visit: Payer: Self-pay | Admitting: Cardiovascular Disease

## 2024-11-19 DIAGNOSIS — I25118 Atherosclerotic heart disease of native coronary artery with other forms of angina pectoris: Secondary | ICD-10-CM

## 2024-11-19 DIAGNOSIS — E785 Hyperlipidemia, unspecified: Secondary | ICD-10-CM

## 2024-11-26 NOTE — Telephone Encounter (Signed)
 Lipids Completed in 10/2024

## 2025-01-09 ENCOUNTER — Ambulatory Visit (HOSPITAL_BASED_OUTPATIENT_CLINIC_OR_DEPARTMENT_OTHER): Admitting: Nurse Practitioner
